# Patient Record
Sex: Female | Born: 1990 | Race: Black or African American | Hispanic: No | Smoking: Never smoker
Health system: Southern US, Community
[De-identification: ages and names within clinical notes are randomized; demographics above are authoritative.]

## PROBLEM LIST (undated history)

## (undated) ENCOUNTER — Inpatient Hospital Stay (HOSPITAL_COMMUNITY): Payer: Self-pay

## (undated) DIAGNOSIS — E11319 Type 2 diabetes mellitus with unspecified diabetic retinopathy without macular edema: Secondary | ICD-10-CM

## (undated) DIAGNOSIS — H269 Unspecified cataract: Secondary | ICD-10-CM

## (undated) DIAGNOSIS — N186 End stage renal disease: Secondary | ICD-10-CM

## (undated) DIAGNOSIS — Z992 Dependence on renal dialysis: Secondary | ICD-10-CM

## (undated) DIAGNOSIS — J45909 Unspecified asthma, uncomplicated: Secondary | ICD-10-CM

## (undated) DIAGNOSIS — I10 Essential (primary) hypertension: Secondary | ICD-10-CM

## (undated) DIAGNOSIS — E1121 Type 2 diabetes mellitus with diabetic nephropathy: Secondary | ICD-10-CM

## (undated) DIAGNOSIS — H35039 Hypertensive retinopathy, unspecified eye: Secondary | ICD-10-CM

## (undated) DIAGNOSIS — E876 Hypokalemia: Secondary | ICD-10-CM

## (undated) DIAGNOSIS — O10919 Unspecified pre-existing hypertension complicating pregnancy, unspecified trimester: Secondary | ICD-10-CM

## (undated) DIAGNOSIS — R112 Nausea with vomiting, unspecified: Secondary | ICD-10-CM

## (undated) DIAGNOSIS — O358XX0 Maternal care for other (suspected) fetal abnormality and damage, not applicable or unspecified: Secondary | ICD-10-CM

## (undated) DIAGNOSIS — E119 Type 2 diabetes mellitus without complications: Secondary | ICD-10-CM

## (undated) DIAGNOSIS — F329 Major depressive disorder, single episode, unspecified: Secondary | ICD-10-CM

## (undated) DIAGNOSIS — O99340 Other mental disorders complicating pregnancy, unspecified trimester: Secondary | ICD-10-CM

## (undated) DIAGNOSIS — R809 Proteinuria, unspecified: Secondary | ICD-10-CM

## (undated) DIAGNOSIS — E109 Type 1 diabetes mellitus without complications: Secondary | ICD-10-CM

## (undated) DIAGNOSIS — F32A Depression, unspecified: Secondary | ICD-10-CM

## (undated) DIAGNOSIS — O365990 Maternal care for other known or suspected poor fetal growth, unspecified trimester, not applicable or unspecified: Secondary | ICD-10-CM

## (undated) DIAGNOSIS — E1029 Type 1 diabetes mellitus with other diabetic kidney complication: Secondary | ICD-10-CM

## (undated) DIAGNOSIS — E860 Dehydration: Secondary | ICD-10-CM

## (undated) DIAGNOSIS — R911 Solitary pulmonary nodule: Secondary | ICD-10-CM

## (undated) DIAGNOSIS — O211 Hyperemesis gravidarum with metabolic disturbance: Secondary | ICD-10-CM

## (undated) DIAGNOSIS — F419 Anxiety disorder, unspecified: Secondary | ICD-10-CM

## (undated) HISTORY — DX: Solitary pulmonary nodule: R91.1

## (undated) HISTORY — PX: WISDOM TOOTH EXTRACTION: SHX21

## (undated) HISTORY — DX: Type 1 diabetes mellitus without complications: E10.9

## (undated) HISTORY — DX: Dehydration: E86.0

## (undated) HISTORY — DX: Maternal care for other known or suspected poor fetal growth, unspecified trimester, not applicable or unspecified: O36.5990

## (undated) HISTORY — PX: NO PAST SURGERIES: SHX2092

## (undated) HISTORY — DX: Hyperemesis gravidarum with metabolic disturbance: O21.1

## (undated) HISTORY — DX: Type 2 diabetes mellitus with unspecified diabetic retinopathy without macular edema: E11.319

## (undated) HISTORY — DX: Hypertensive retinopathy, unspecified eye: H35.039

## (undated) HISTORY — DX: Hypomagnesemia: E83.42

## (undated) HISTORY — DX: Other mental disorders complicating pregnancy, unspecified trimester: O99.340

## (undated) HISTORY — DX: Nausea with vomiting, unspecified: R11.2

## (undated) HISTORY — DX: Type 2 diabetes mellitus without complications: E11.9

## (undated) HISTORY — DX: Major depressive disorder, single episode, unspecified: F32.9

## (undated) HISTORY — DX: Proteinuria, unspecified: R80.9

## (undated) HISTORY — DX: Unspecified cataract: H26.9

## (undated) HISTORY — DX: Type 2 diabetes mellitus with diabetic nephropathy: E11.21

## (undated) HISTORY — DX: Unspecified pre-existing hypertension complicating pregnancy, unspecified trimester: O10.919

## (undated) HISTORY — DX: Morbid (severe) obesity due to excess calories: E66.01

## (undated) HISTORY — DX: Maternal care for other (suspected) fetal abnormality and damage, not applicable or unspecified: O35.8XX0

## (undated) HISTORY — DX: Type 1 diabetes mellitus with other diabetic kidney complication: E10.29

## (undated) HISTORY — DX: Unspecified asthma, uncomplicated: J45.909

## (undated) HISTORY — DX: Hypokalemia: E87.6

## (undated) NOTE — *Deleted (*Deleted)
[facility] Note  11/12/2019     CHIEF COMPLAINT Patient presents for No chief complaint on file.   HISTORY OF PRESENT ILLNESS: Barbara Donovan is a 42 y.o. female who presents to the clinic today for:   pt is   Referring physician: Fanny Bien, MD [address] [place],  [place] [address]  HISTORICAL INFORMATION:   Selected notes from the MEDICAL RECORD NUMBER Referred by Dr. [name] for concern of bilateral CRVO    CURRENT MEDICATIONS: No current outpatient medications on file. (Ophthalmic Drugs)   No current facility-administered medications for this visit. (Ophthalmic Drugs)   Current Outpatient Medications (Other)  Medication Sig  . Blood Pressure Monitoring (OMRON 3 SERIES BP MONITOR) DEVI 1 each by Does not apply route daily. Take daily blood pressure. Please report back to Dr. Radford Pax in 2 wks.  . carvedilol (COREG) 25 MG tablet Take 1 tablet (25 mg total) by mouth 2 (two) times daily.  Marland Kitchen diltiazem (CARDIZEM CD) 240 MG 24 hr capsule Take 1 capsule (240 mg total) by mouth daily.  . furosemide (LASIX) 20 MG tablet Take 2 tablets (40 mg total) by mouth daily as needed for edema. (Patient taking differently: Take 20 mg by mouth daily as needed for edema. )  . hydrALAZINE (APRESOLINE) 50 MG tablet Take 50 mg by mouth three times daily  . insulin aspart (NOVOLOG) 100 UNIT/ML injection FOR USE IN PUMP, TOTAL OF 80 UNITS PER DAY. (Patient taking differently: Inject 80 Units into the skin daily. FOR USE IN PUMP, TOTAL OF 80 UNITS PER DAY.)  . Insulin Pen Needle (PEN NEEDLES) 32G X 5 MM MISC 1 Device by Does not apply route 4 (four) times daily. (Patient not taking: Reported on 09/14/2019)  . irbesartan (AVAPRO) 300 MG tablet Take 300 mg by mouth daily.  . metoCLOPramide (REGLAN) 10 MG tablet Take 1 tablet (10 mg total) by mouth every 8 (eight) hours as needed for up to 3 days for nausea.  . ondansetron (ZOFRAN-ODT) 8 MG disintegrating  tablet Take 1 tablet (8 mg total) by mouth every 8 (eight) hours as needed for nausea or vomiting.  [name] VERIO test strip USE AS DIRECTED 5 TIMES DAILY  . potassium chloride (KLOR-CON) 10 MEQ tablet Take 2 tablets (20 mEq total) by mouth daily for 2 days.  . promethazine (PHENERGAN) 25 MG tablet Take 1 tablet (25 mg total) by mouth every 6 (six) hours as needed for nausea or vomiting.   No current facility-administered medications for this visit. (Other)      REVIEW OF SYSTEMS:    ALLERGIES No Known Allergies  PAST MEDICAL HISTORY Past Medical History:  Diagnosis Date  . Asthma    as a child, no problems as an adult, no inhaler  . Cataract    NS OU  . Chronic hypertension during pregnancy, antepartum 08/19/2017  . Dehydration 01/28/2018  . Depression during pregnancy, antepartum 07/07/2017   6/20: Short trial of zoloft previously, reports didn't help much but also didn't give it a chance Discussed r/b/a SSRIs in pregnancy, agrees to try Zoloft again, rx sent No SI/HI/red flags  . Diabetes ([place])    TYPE I  . Diabetic retinopathy ([place]) 06/09/2017   07/2017 with bilateral severe diabetic non-proliferative retinopathy with macular edema.  Marland Kitchen HTN (hypertension)   . Hypertensive retinopathy    OU  . Hypokalemia 01/22/2018  . Hypomagnesemia 01/28/2018  . Intractable nausea and vomiting  01/22/2018  . Intrauterine growth restriction (IUGR) affecting care of mother 12/22/2017  . Morbid obesity ([place])   . 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 Proteinuria due to type 1 diabetes mellitus ([place]) 11/02/2017   Baseline Pr: Cr 10.23  . Severe hyperemesis gravidarum 10/30/2017  . Type I diabetes mellitus ([place]) 07/07/2017   Current Diabetic Medications:  Insulin  [x]  Aspirin 81 mg daily after 12 weeks (? A2/B GDM)  Required Referrals for A1GDM or A2GDM: [x]  Diabetes Education and Testing Supplies [x]  Nutrition Cousult  For A2/B GDM or higher classes of DM [x]  Diabetes Education and Testing  Supplies [x]  Nutrition Counsult [x]  Fetal ECHO after 20 weeks  [x]  Eye exam for retina evaluation - severe retinopathy 7/19  Base  . Ventricular septal defect (VSD) of fetus in singleton pregnancy, antepartum 09/30/2017   May go to newborn nursery per Dr. Lenard Simmer [facility] prior to discharge   Past Surgical History:  Procedure Laterality Date  . [place] VITRECTOMY WITH 20 GAUGE MVR PORT FOR MACULAR HOLE Left 07/20/2018   Procedure: 25 GAUGE PARS PLANA VITRECTOMY LEFT EYE;  Surgeon: Bernarda Caffey, MD;  Location: [facility];  Service: Ophthalmology;  Laterality: Left;  . CESAREAN SECTION N/A 12/26/2017   Procedure: CESAREAN SECTION;  Surgeon: Osborne Oman, MD;  Location: [facility];  Service: Obstetrics;  Laterality: N/A;  . EYE EXAMINATION UNDER ANESTHESIA Right 07/20/2018   Procedure: Eye Exam Under Anesthesia RIGHT EYE;  Surgeon: Bernarda Caffey, MD;  Location: [facility];  Service: Ophthalmology;  Laterality: Right;  . EYE SURGERY Left 07/2018  . GAS INSERTION Left 07/19/2019   Procedure: INSERTION OF GAS;  Surgeon: Bernarda Caffey, MD;  Location: [facility];  Service: Ophthalmology;  Laterality: Left;  SF6  . INJECTION OF SILICONE OIL Left 99991111   Procedure: Injection Of Silicone Oil LEFT EYE;  Surgeon: Bernarda Caffey, MD;  Location: [facility];  Service: Ophthalmology;  Laterality: Left;  . LASER PHOTO ABLATION Right 07/20/2018   Procedure: Laser Photo Ablation RIGHT EYE;  Surgeon: Bernarda Caffey, MD;  Location: [facility];  Service: Ophthalmology;  Laterality: Right;  . MEMBRANE PEEL Left 07/20/2018   Procedure: Membrane Peel LEFT EYE;  Surgeon: Bernarda Caffey, MD;  Location: [facility];  Service: Ophthalmology;  Laterality: Left;  . MEMBRANE PEEL Left 07/19/2019   Procedure: MEMBRANE PEEL;  Surgeon: Bernarda Caffey, MD;  Location: [place] [place] [facility];  Service: Ophthalmology;  Laterality: Left;  . MITOMYCIN C APPLICATION Bilateral 99991111   Procedure: Avastin Application;  Surgeon: Bernarda Caffey, MD;  Location: [facility];  Service:  Ophthalmology;  Laterality: Bilateral;  . PHOTOCOAGULATION WITH LASER Left 07/20/2018   Procedure: Photocoagulation With Laser LEFT EYE;  Surgeon: Bernarda Caffey, MD;  Location: [facility];  Service: Ophthalmology;  Laterality: Left;  . PHOTOCOAGULATION WITH LASER Left 07/19/2019   Procedure: PHOTOCOAGULATION WITH LASER;  Surgeon: Bernarda Caffey, MD;  Location: [facility];  Service: Ophthalmology;  Laterality: Left;  . RETINAL DETACHMENT SURGERY Left 07/20/2018   Dr. Bernarda Caffey  . SILICON OIL REMOVAL Left 07/19/2019   Procedure: 25g PARS PLANA VITRECTOMY WITH SILICON OIL REMOVAL;  Surgeon: Bernarda Caffey, MD;  Location: [facility];  Service: Ophthalmology;  Laterality: Left;  . WISDOM TOOTH EXTRACTION      FAMILY HISTORY Family History  Problem Relation Age of Onset  . Diabetes Mother   . Aneurysm Mother 38  . Seizures Mother   . Diabetes Father   . Cataracts Father   . COPD Father   .  Heart attack Father   . Heart disease Father   . Healthy Sister   . Healthy Daughter   . Stroke Maternal Grandfather   . Amblyopia Neg Hx   . Blindness Neg Hx   . Glaucoma Neg Hx   . Macular degeneration Neg Hx   . Retinal detachment Neg Hx   . Strabismus Neg Hx   . Retinitis pigmentosa Neg Hx     SOCIAL HISTORY Social History   Tobacco Use  . Smoking status: Never Smoker  . Smokeless tobacco: Never Used  Vaping Use  . Vaping Use: Never used  Substance Use Topics  . Alcohol use: Not Currently    Comment: SOCIALLY  . Drug use: Never         OPHTHALMIC EXAM:  Not recorded     IMAGING AND PROCEDURES  Imaging and Procedures for @TODAY @           ASSESSMENT/PLAN:    ICD-10-CM   1. 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 Traction detachment of left retina  H33.42   3. Vitreous hemorrhage of both eyes ([place])  H43.13   4. Epiretinal membrane (ERM) of left eye  H35.372   5. Retinal edema  H35.81   6. Essential hypertension   I10   7. Hypertensive retinopathy of both eyes  H35.033   8. Pseudophakia  Z96.1   9. Combined forms of age-related cataract of right eye  H25.811   10. Combined forms of age-related cataract of both eyes  H25.813   11. Central retinal vein occlusion with macular edema of both eyes  H34.8130   12. Nuclear sclerosis of both eyes  H25.13   13. Type 1 diabetes mellitus with moderate nonproliferative retinopathy of both eyes and macular edema ([place])  E10.3313   14. Vitreous hemorrhage of left eye ([facility])  H43.12   15. [redacted] weeks gestation of pregnancy  Z3A.33   16. [redacted] weeks gestation of pregnancy  Z3A.15     1-3. 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 Eulas Post, 12.15.20 -- looks good, mild PCO  - s/p PPV/SOR removal/Tissue Blue stain/MP/20% SF6 gas, IVA OS, 07.01.21             - IOP 17 (OS) -- vastly improved from 40+ last week             - continue Brimonidine/Cosopt TID OS  - continue latanoprost QHS OS  - f/u 4 weeks, DFE, OCT  OD  - today, VH significantly improved (amazing response to IVA OD #3, 9.27.21)  - s/p fill in PRP and IVA OD 07.02.2020 in operating room  - diffuse VH OD ~11.26.20   - s/p IVA OD #2 (11.30.20), #3 (09.24.21)  - BCVA  improved to 20/25 from HM  - f/u 4 weeks, DFE, OCT  4,5. Epiretinal membrane, left eye -- improved  - focal ERM with retinal thickening nasal macula   - s/p TissueBlue assisted membrane peel  OS as above (7.1.21)  - OCT today shows persistent nasal thickening post-ERM peel   6,7. Hypertensive retinopathy OU  - there was concern for pregnancy related hypertension (I.e. Pre-eclampsia, Eclampsia, HELLP, etc) during pregnancy  - also during pregnancy (7.17.2019), had bilateral CRVO w/ CME (OD > OS) s/p PRP OS  - discussed importance of tight BP control  - monitor  8. Mixed cataract OD  - The symptoms of cataract, surgical options, and treatments and risks were discussed with patient.  - not visually significant    9. Pseudophakia OS  - s/p CE/IOL OS w/ Dr. Eulas Post, 12.15.2020  - beautiful surgery, doing well    Ophthalmic Meds Ordered this visit:  No orders of the defined types were placed in this encounter.    This document serves as a record of services personally performed by Gardiner Sleeper, MD, PhD. It was created on their behalf by Leeann Must, [place], an ophthalmic technician. The creation of this record is the provider's dictation and/or activities during the visit.    Electronically signed by: Leeann Must, COA @TODAY @ 3:50 PM   Abbreviations: M myopia (nearsighted); A astigmatism; H hyperopia (farsighted); P presbyopia; Mrx spectacle prescription;  CTL contact lenses; OD right eye; OS left eye; OU both eyes  XT exotropia; ET esotropia; PEK punctate epithelial keratitis; PEE punctate epithelial erosions; DES dry eye syndrome; MGD meibomian gland dysfunction; ATs artificial tears; PFAT's preservative free artificial tears; [place] nuclear sclerotic cataract; PSC posterior subcapsular cataract; ERM epi-retinal membrane; PVD posterior vitreous detachment; RD retinal detachment; DM diabetes mellitus; DR diabetic retinopathy; NPDR non-proliferative diabetic retinopathy; PDR proliferative diabetic retinopathy; CSME clinically significant macular edema; DME diabetic macular edema; dbh dot blot hemorrhages; CWS cotton wool spot; POAG primary open angle glaucoma; C/D cup-to-disc  ratio; HVF humphrey visual field; GVF goldmann visual field; OCT optical coherence tomography; IOP intraocular pressure; BRVO Branch retinal vein occlusion; CRVO central retinal vein occlusion; CRAO central retinal artery occlusion; BRAO branch retinal artery occlusion; RT retinal tear; SB scleral buckle; PPV pars plana vitrectomy; VH Vitreous hemorrhage; PRP panretinal laser photocoagulation; IVK intravitreal kenalog; VMT vitreomacular traction; MH Macular hole;  NVD neovascularization of the disc; NVE neovascularization elsewhere; AREDS age related eye disease study; ARMD age related macular degeneration; POAG primary open angle glaucoma; EBMD epithelial/anterior basement membrane dystrophy; ACIOL anterior chamber intraocular lens; IOL intraocular lens; PCIOL posterior chamber intraocular lens; Phaco/IOL phacoemulsification with intraocular lens placement; [place] photorefractive keratectomy; LASIK laser assisted in situ keratomileusis; HTN hypertension; DM diabetes mellitus; COPD chronic obstructive pulmonary disease

---
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
## 2011-01-05 ENCOUNTER — Emergency Department: Payer: Self-pay | Admitting: Internal Medicine

## 2013-10-30 DIAGNOSIS — J45909 Unspecified asthma, uncomplicated: Secondary | ICD-10-CM | POA: Insufficient documentation

## 2013-10-30 HISTORY — DX: Unspecified asthma, uncomplicated: J45.909

## 2014-11-02 DIAGNOSIS — F32A Depression, unspecified: Secondary | ICD-10-CM | POA: Insufficient documentation

## 2014-11-02 DIAGNOSIS — F329 Major depressive disorder, single episode, unspecified: Secondary | ICD-10-CM | POA: Insufficient documentation

## 2017-05-30 ENCOUNTER — Encounter: Payer: Self-pay | Admitting: Endocrinology

## 2017-06-09 ENCOUNTER — Ambulatory Visit: Payer: Self-pay | Admitting: Endocrinology

## 2017-06-09 ENCOUNTER — Other Ambulatory Visit: Payer: Self-pay | Admitting: Endocrinology

## 2017-06-09 ENCOUNTER — Encounter: Payer: Self-pay | Admitting: Endocrinology

## 2017-06-09 DIAGNOSIS — E109 Type 1 diabetes mellitus without complications: Secondary | ICD-10-CM

## 2017-06-09 DIAGNOSIS — E11319 Type 2 diabetes mellitus with unspecified diabetic retinopathy without macular edema: Secondary | ICD-10-CM | POA: Insufficient documentation

## 2017-06-09 HISTORY — DX: Type 2 diabetes mellitus with unspecified diabetic retinopathy without macular edema: E11.319

## 2017-06-09 LAB — TSH: TSH: 1.14 u[IU]/mL (ref 0.35–4.50)

## 2017-06-09 MED ORDER — GLUCOSE BLOOD VI STRP: ORAL_STRIP | 11 refills | Status: DC

## 2017-06-09 MED ORDER — INSULIN ASPART 100 UNIT/ML FLEXPEN: 7.0000 [IU] | PEN_INJECTOR | Freq: Three times a day (TID) | SUBCUTANEOUS | 11 refills | Status: DC

## 2017-06-09 MED ORDER — GLUCOSE BLOOD VI STRP: ORAL_STRIP | 12 refills | Status: DC

## 2017-06-09 MED ORDER — ONETOUCH ULTRASOFT LANCETS MISC: 12 refills | Status: DC

## 2017-06-09 MED ORDER — BASAGLAR KWIKPEN 100 UNIT/ML ~~LOC~~ SOPN
22.0000 [IU] | PEN_INJECTOR | Freq: Every day | SUBCUTANEOUS | 11 refills | Status: DC
Start: 2017-06-09 — End: 2017-06-24

## 2017-06-09 MED ORDER — ONETOUCH VERIO W/DEVICE KIT: 1.0000 | PACK | Freq: Every day | 0 refills | Status: DC

## 2017-06-09 NOTE — Progress Notes (Signed)
Subjective:    Patient ID: Barbara Donovan, female    DOB: [birth year], 27 y.o.   MRN: [id number]  HPI pt is referred by Dr Garwin Brothers, for diabetes.  Pt states DM was dx'ed in 2003; she has mild if any neuropathy of the lower extremities; she is unaware of any associated chronic complications; she has been on insulin since 2009; pt says her diet and exercise are poor; she has never had pancreatitis, pancreatic surgery, or severe hypoglycemia.  Last episode of DKA was 2015, after she stopped taking her insulin.  Since recent a1c, she was rx'ed basaglar, 22 units qd, and novolog 7 units 3 times a day (just before each meal).  However, she did not start taking.  She is now [redacted] weeks pregnant.   Past Medical History:  Diagnosis Date  . Diabetes [facility])      Social History   Socioeconomic History  . Marital status: Single    Spouse name: Not on file  . Number of children: Not on file  . Years of education: Not on file  . Highest education level: Not on file  Occupational History  . Not on file  Social Needs  . Financial resource strain: Not on file  . Food insecurity:    Worry: Not on file    Inability: Not on file  . Transportation needs:    Medical: Not on file    Non-medical: Not on file  Tobacco Use  . Smoking status: Never Smoker  . Smokeless tobacco: Never Used  Substance and Sexual Activity  . Alcohol use: Not Currently  . Drug use: Never  . Sexual activity: Yes  Lifestyle  . Physical activity:    Days per week: Not on file    Minutes per session: Not on file  . Stress: Not on file  Relationships  . Social connections:    Talks on phone: Not on file    Gets together: Not on file    Attends religious service: Not on file    Active member of club or organization: Not on file    Attends meetings of clubs or organizations: Not on file    Relationship status: Not on file  . Intimate partner violence:    Fear of current or ex partner: Not on file    Emotionally abused: Not  on file    Physically abused: Not on file    Forced sexual activity: Not on file  Other Topics Concern  . Not on file  Social History Narrative  . Not on file    No current outpatient medications on file prior to visit.   No current facility-administered medications on file prior to visit.     No Known Allergies  Family History  Problem Relation Age of Onset  . Diabetes Mother   . Diabetes Father     BP 132/82   Pulse 92   Wt 158 lb (71.7 kg)   LMP 04/02/2017 (Approximate)   SpO2 94%     Review of Systems denies weight loss, blurry vision, headache, chest pain, sob, n/v, urinary frequency, muscle cramps, excessive diaphoresis, cold intolerance, rhinorrhea, and easy bruising.  Depression is well-controlled.      Objective:   Physical Exam VS: see vs page GEN: no distress HEAD: head: no deformity eyes: no periorbital swelling, no proptosis external nose and ears are normal mouth: no lesion seen NECK: supple, thyroid is slightly and diffusely enlarged.  CHEST WALL: no deformity LUNGS: clear to auscultation CV:  reg rate and rhythm, no murmur ABD: abdomen is soft, nontender.  no hepatosplenomegaly.  not distended.  no hernia MUSCULOSKELETAL: muscle bulk and strength are grossly normal.  no obvious joint swelling.  gait is normal and steady EXTEMITIES: no deformity.  no ulcer on the feet.  feet are of normal color and temp.  no edema PULSES: dorsalis pedis intact bilat.  no carotid bruit NEURO:  cn 2-12 grossly intact.   readily moves all 4's.  sensation is intact to touch on the feet SKIN:  Normal texture and temperature.  No rash or suspicious lesion is visible.   NODES:  None palpable at the neck PSYCH: alert, well-oriented.  Does not appear anxious nor depressed.    outside test results are reviewed: A1c=14.7%  I have reviewed outside records, and summarized: Pt was noted to have severely elevated a1c, and referred here.  She was [facility], and  was noted to have chronically very poor glycemic control.        Assessment & Plan:  Type 1 DM: severe exacerbation Noncompliance with insulin, new to me: this is high-risk OB case.    Patient Instructions  good diet and exercise significantly improve the control of your diabetes.  please let me know if you wish to be referred to a dietician.  high blood sugar is very risky to your health.  you should see an eye doctor and dentist every year.  It is very important to get all recommended vaccinations.  Controlling your blood pressure and cholesterol drastically reduces the damage diabetes does to your body.  Those who smoke should quit.  Please discuss these with your doctor.  check your blood sugar 5 times a day, before the 3 meals, at bedtime, and after meals on a rotating basis.  also check if you have symptoms of your blood sugar being too high or too low.  please keep a record of the readings and bring it to your next appointment here (or you can bring the meter itself).  You can write it on any piece of paper.  please call us sooner if your blood sugar goes below 70, or if you have a lot of readings over 200.   Please come back for a follow-up appointment next week.  Please also see Vaughan Basta then.

## 2017-06-09 NOTE — Patient Instructions (Signed)
good diet and exercise significantly improve the control of your diabetes.  please let me know if you wish to be referred to a dietician.  high blood sugar is very risky to your health.  you should see an eye doctor and dentist every year.  It is very important to get all recommended vaccinations.  Controlling your blood pressure and cholesterol drastically reduces the damage diabetes does to your body.  Those who smoke should quit.  Please discuss these with your doctor.  check your blood sugar 5 times a day, before the 3 meals, at bedtime, and after meals on a rotating basis.  also check if you have symptoms of your blood sugar being too high or too low.  please keep a record of the readings and bring it to your next appointment here (or you can bring the meter itself).  You can write it on any piece of paper.  please call us sooner if your blood sugar goes below 70, or if you have a lot of readings over 200.   Please come back for a follow-up appointment next week.  Please also see Barbara Donovan then.

## 2017-06-12 ENCOUNTER — Encounter: Payer: Self-pay | Admitting: Endocrinology

## 2017-06-14 ENCOUNTER — Encounter: Payer: Self-pay | Admitting: Endocrinology

## 2017-06-14 ENCOUNTER — Ambulatory Visit: Payer: BC Managed Care – PPO | Admitting: Endocrinology

## 2017-06-14 ENCOUNTER — Encounter: Payer: BC Managed Care – PPO | Attending: Endocrinology | Admitting: Nutrition

## 2017-06-14 VITALS — BP 152/78 | HR 98 | Wt 168.0 lb

## 2017-06-14 DIAGNOSIS — E108 Type 1 diabetes mellitus with unspecified complications: Secondary | ICD-10-CM

## 2017-06-14 DIAGNOSIS — E109 Type 1 diabetes mellitus without complications: Secondary | ICD-10-CM

## 2017-06-14 NOTE — Progress Notes (Signed)
Patient is here with her hanband. we Discussed how pumps work, and she was shown the different pumps.  We discussed the advantages and disadvantages of pump therapy, as well as of each different pump.  She was given brochures of each model and of the dexcom sensor.  She will review them, and go on the websites to determine which pump she wants.  She will then fill out the appropriate paperwork and return it to me, to be faxed in. We discussed the goals for blood sugar control and the need to adjust the meal time insulin dose for the meal size and she reported good understanding of this. We also discussed the importance of testing blood sugars before meals and at 2 hours after meals, to determine if she is taking the correct insulin dose.She reported good understanding of this and had no final questions.

## 2017-06-14 NOTE — Patient Instructions (Addendum)
Please continue the same insulins. Please see Barbara Donovan today. check your blood sugar 5 times a day, before the 3 meals, at bedtime, and after meals on a rotating basis.  also check if you have symptoms of your blood sugar being too high or too low.  please keep a record of the readings and bring it to your next appointment here (or you can bring the meter itself).  You can write it on any piece of paper.  please call us sooner if your blood sugar goes below 70, or if you have a lot of readings over 200.  Please come back for a follow-up appointment in 7-10 days.

## 2017-06-14 NOTE — Progress Notes (Signed)
Subjective:    Patient ID: Barbara Donovan, female    DOB: 04/09/1990, 27 y.o.   MRN: [id number]  HPI Pt returns for f/u of diabetes mellitus: DM type: 1 Dx'ed: 2637 Complications: none Therapy: insulin since 2009 DKA: last episode was 2015.  Severe hypoglycemia: never Pancreatitis: never Pancreatic imaging: never  Other: she takes multiple daily injections Interval history:  She is now [redacted] weeks pregnant. meter is downloaded today, and the printout is scanned into the record.  cbg varies from 90-270.  There is no trend throughout the day.  However, she says the high cbg's are before she started the insulin.   Past Medical History:  Diagnosis Date  . Diabetes [facility])      Social History   Socioeconomic History  . Marital status: Single    Spouse name: Not on file  . Number of children: Not on file  . Years of education: Not on file  . Highest education level: Not on file  Occupational History  . Not on file  Social Needs  . Financial resource strain: Not on file  . Food insecurity:    Worry: Not on file    Inability: Not on file  . Transportation needs:    Medical: Not on file    Non-medical: Not on file  Tobacco Use  . Smoking status: Never Smoker  . Smokeless tobacco: Never Used  Substance and Sexual Activity  . Alcohol use: Not Currently  . Drug use: Never  . Sexual activity: Yes  Lifestyle  . Physical activity:    Days per week: Not on file    Minutes per session: Not on file  . Stress: Not on file  Relationships  . Social connections:    Talks on phone: Not on file    Gets together: Not on file    Attends religious service: Not on file    Active member of club or organization: Not on file    Attends meetings of clubs or organizations: Not on file    Relationship status: Not on file  . Intimate partner violence:    Fear of current or ex partner: Not on file    Emotionally abused: Not on file    Physically abused: Not on file    Forced sexual activity:  Not on file  Other Topics Concern  . Not on file  Social History Narrative  . Not on file    Current Outpatient Medications on File Prior to Visit  Medication Sig Dispense Refill  . Blood Glucose Monitoring Suppl (ONETOUCH VERIO) w/Device KIT 1 Device by Does not apply route daily. 1 kit 0  . glucose blood (ONETOUCH VERIO) test strip Used to check blood sugars five times daily. 150 each 12  . insulin aspart (NOVOLOG FLEXPEN) 100 UNIT/ML FlexPen Inject 7 Units into the skin 3 (three) times daily with meals. And pen needles 4/day 15 mL 11  . Insulin Glargine (BASAGLAR KWIKPEN) 100 UNIT/ML SOPN Inject 0.22 mLs (22 Units total) into the skin at bedtime. 5 pen 11  . Lancets (ONETOUCH ULTRASOFT) lancets Used to check blood sugars five times daily. 200 each 12   No current facility-administered medications on file prior to visit.     No Known Allergies  Family History  Problem Relation Age of Onset  . Diabetes Mother   . Diabetes Father     BP (!) 152/78   Pulse 98   Wt 168 lb (76.2 kg)   LMP 04/02/2017 (Approximate)  SpO2 99%    Review of Systems She denies hypoglycemia.      Objective:   Physical Exam VITAL SIGNS:  See vs page.   GENERAL: no distress.   VITAL SIGNS:  See vs page GENERAL: no distress Pulses: foot pulses are intact bilaterally.   MSK: no deformity of the feet or ankles.  CV: trace bilat edema of the legs.   Skin:  no ulcer on the feet or ankles.  normal color and temp on the feet and ankles Neuro: sensation is intact to touch on the feet and ankles.         Assessment & Plan:  Type 1 DM: apparently well-controlled Pregnancy: in this setting, she needs aggressive glycemic control  Patient Instructions  Please continue the same insulins. Please see Vaughan Basta today. check your blood sugar 5 times a day, before the 3 meals, at bedtime, and after meals on a rotating basis.  also check if you have symptoms of your blood sugar being too high or too low.   please keep a record of the readings and bring it to your next appointment here (or you can bring the meter itself).  You can write it on any piece of paper.  please call us sooner if your blood sugar goes below 70, or if you have a lot of readings over 200.  Please come back for a follow-up appointment in 7-10 days.

## 2017-06-14 NOTE — Patient Instructions (Signed)
Test blood sugars before meals and at bedtime. Increase meal time insulin by 1-2 units if eating more food Call if questions.  Call when pump comes in to set up appointment for training.

## 2017-06-22 ENCOUNTER — Encounter (HOSPITAL_COMMUNITY): Payer: Self-pay | Admitting: *Deleted

## 2017-06-24 ENCOUNTER — Ambulatory Visit: Payer: BC Managed Care – PPO | Admitting: Endocrinology

## 2017-06-24 ENCOUNTER — Encounter: Payer: Self-pay | Admitting: Endocrinology

## 2017-06-24 ENCOUNTER — Ambulatory Visit (HOSPITAL_COMMUNITY)
Admission: RE | Admit: 2017-06-24 | Discharge: 2017-06-24 | Disposition: A | Discharge status: Home / Self Care | Payer: BC Managed Care – PPO | Source: Ambulatory Visit | Attending: Obstetrics and Gynecology | Admitting: Obstetrics and Gynecology

## 2017-06-24 ENCOUNTER — Encounter (HOSPITAL_COMMUNITY): Payer: Self-pay

## 2017-06-24 ENCOUNTER — Telehealth: Payer: Self-pay | Admitting: Nutrition

## 2017-06-24 VITALS — BP 158/74 | HR 101 | Wt 175.0 lb

## 2017-06-24 DIAGNOSIS — E109 Type 1 diabetes mellitus without complications: Secondary | ICD-10-CM | POA: Diagnosis not present

## 2017-06-24 DIAGNOSIS — R030 Elevated blood-pressure reading, without diagnosis of hypertension: Secondary | ICD-10-CM | POA: Insufficient documentation

## 2017-06-24 DIAGNOSIS — Z3A09 9 weeks gestation of pregnancy: Secondary | ICD-10-CM | POA: Diagnosis not present

## 2017-06-24 DIAGNOSIS — O26891 Other specified pregnancy related conditions, first trimester: Secondary | ICD-10-CM | POA: Insufficient documentation

## 2017-06-24 DIAGNOSIS — Z794 Long term (current) use of insulin: Secondary | ICD-10-CM | POA: Insufficient documentation

## 2017-06-24 DIAGNOSIS — O24011 Pre-existing diabetes mellitus, type 1, in pregnancy, first trimester: Secondary | ICD-10-CM | POA: Diagnosis present

## 2017-06-24 LAB — POCT GLYCOSYLATED HEMOGLOBIN (HGB A1C): HEMOGLOBIN A1C: 11.9 % — AB (ref 4.0–5.6)

## 2017-06-24 MED ORDER — BASAGLAR KWIKPEN 100 UNIT/ML ~~LOC~~ SOPN: 28.0000 [IU] | PEN_INJECTOR | Freq: Every day | SUBCUTANEOUS | 11 refills | Status: DC

## 2017-06-24 NOTE — Patient Instructions (Addendum)
Please continue the same novolog, and: Increase the basaglar to 28 units at bedtime.  check your blood sugar 5 times a day, before the 3 meals, at bedtime, and after meals on a rotating basis.  also check if you have symptoms of your blood sugar being too high or too low.  please keep a record of the readings and bring it to your next appointment here (or you can bring the meter itself).  You can write it on any piece of paper.  please call us sooner if your blood sugar goes below 70, or if you have a lot of readings over 200.  Please come back for a follow-up appointment in 2-3 weeks.

## 2017-06-24 NOTE — Telephone Encounter (Signed)
Please call patient at ph# [phone] needs CPT code for her pump

## 2017-06-24 NOTE — Progress Notes (Signed)
Subjective:    Patient ID: Barbara Donovan, female    DOB: January 28, 1990, 27 y.o.   MRN: [id number]  HPI Pt returns for f/u of diabetes mellitus: DM type: 1 Dx'ed: 9735 Complications: none Therapy: insulin since 2009 DKA: last episode was 2015.  Severe hypoglycemia: never Pancreatitis: never Pancreatic imaging: never.  Other: she takes multiple daily injections. Interval history:  no cbg record, but states cbg's vary from 55-200.  It is in general highest in the afternoon, but the 55 was also then.  This happened after a smaller than expected meal.  She says cbg is higher fasting than at HS.  pt states he feels well in general.  She is working on getting a pump.  Pt says she never misses the insulin.   Past Medical History:  Diagnosis Date  . Diabetes [facility])     Past Surgical History:  Procedure Laterality Date  . WISDOM TOOTH EXTRACTION      Social History   Socioeconomic History  . Marital status: Single    Spouse name: Not on file  . Number of children: Not on file  . Years of education: Not on file  . Highest education level: Not on file  Occupational History  . Not on file  Social Needs  . Financial resource strain: Not on file  . Food insecurity:    Worry: Not on file    Inability: Not on file  . Transportation needs:    Medical: Not on file    Non-medical: Not on file  Tobacco Use  . Smoking status: Never Smoker  . Smokeless tobacco: Never Used  Substance and Sexual Activity  . Alcohol use: Not Currently  . Drug use: Never  . Sexual activity: Yes  Lifestyle  . Physical activity:    Days per week: Not on file    Minutes per session: Not on file  . Stress: Not on file  Relationships  . Social connections:    Talks on phone: Not on file    Gets together: Not on file    Attends religious service: Not on file    Active member of club or organization: Not on file    Attends meetings of clubs or organizations: Not on file    Relationship status: Not on file   . Intimate partner violence:    Fear of current or ex partner: Not on file    Emotionally abused: Not on file    Physically abused: Not on file    Forced sexual activity: Not on file  Other Topics Concern  . Not on file  Social History Narrative  . Not on file    Current Outpatient Medications on File Prior to Visit  Medication Sig Dispense Refill  . aspirin 81 MG chewable tablet Chew by mouth daily.    Marland Kitchen glucose blood (ONETOUCH VERIO) test strip Used to check blood sugars five times daily. 150 each 12  . insulin aspart (NOVOLOG FLEXPEN) 100 UNIT/ML FlexPen Inject 7 Units into the skin 3 (three) times daily with meals. And pen needles 4/day 15 mL 11  . Lancets (ONETOUCH ULTRASOFT) lancets Used to check blood sugars five times daily. 200 each 12   No current facility-administered medications on file prior to visit.     No Known Allergies  Family History  Problem Relation Age of Onset  . Diabetes Mother   . Diabetes Father     BP (!) 158/74   Pulse (!) 101   Wt 175 lb (  79.4 kg)   LMP 04/02/2017 (Approximate)   SpO2 99%   BMI 27.41 kg/m   Review of Systems Denies LOC.     Objective:   Physical Exam VITAL SIGNS:  See vs page GENERAL: no distress Pulses: dorsalis pedis intact bilat.   MSK: no deformity of the feet CV: no leg edema Skin:  no ulcer on the feet.  normal color and temp on the feet. Neuro: sensation is intact to touch on the feet       Assessment & Plan:  Type 1 DM: apparently improved back on insulin Noncompliance with cbg recording: this complicates the rx of DM Pregnancy: she needs aggresive glycemic control  Patient Instructions  Please continue the same novolog, and: Increase the basaglar to 28 units at bedtime.  check your blood sugar 5 times a day, before the 3 meals, at bedtime, and after meals on a rotating basis.  also check if you have symptoms of your blood sugar being too high or too low.  please keep a record of the readings and  bring it to your next appointment here (or you can bring the meter itself).  You can write it on any piece of paper.  please call us sooner if your blood sugar goes below 70, or if you have a lot of readings over 200.  Please come back for a follow-up appointment in 2-3 weeks.

## 2017-06-24 NOTE — Consult Note (Signed)
I had the pleasure of seeing your patient Barbara Donovan in the [facility] on 06/24/2017. As you know, Barbara Donovan is a 27 y.o. G1P0 at [redacted]w[redacted]d who presents for consultation regarding her history of Type 1 pregestational diabetes and likely chronic hypertension.  Barbara Donovan was diagnosed with diabetes in 2003. She has been managed on insulin since that time, but was not taking any insulin for the last year-and-a-half. She recently restarted insulin with the diagnosis of pregnancy. She is currently followed by Dr. Loanne Drilling of endocrinology and is on a regimen of Lantus 22 units HS and Novolog 7 units with meals. She does not currently carb count but is trying to learn. They have discussed starting an insulin pump and continuous glucose monitor, but she is having difficulty with her insurance approving this. Barbara Donovan's most recent HbgA1c was 14.7 in May of this year. TSH was normal at 1.14. Barbara Donovan reports being seen for an eye exam at [facility] in March. She reports an episode of hospitalization for DKA in 2015 but no recent hospitalizations.   Barbara Donovan also reports she has been told she may have elevated blood pressure, although she was not previously aware of this. Her blood pressure today is elevated at 151/91.  The remainder of [place] medical history includes childhood asthma. Her past surgical history is significant for wisdom teeth extraction. She takes prenatal vitamins and insulin and is allergic to no medications. Barbara Donovan denies alcohol, tobacco or other drug use.  Her family history is significant for diabetes in both parents. She works as a Lawyer. She denies pregnancy related complaints.  During our visit today, we discussed the following issues:  1. T1DM diabetes: The significance and management of pregestational diabetes in pregnancy were reviewed.  We discussed maternal risks including preeclampsia, cesarean delivery,  diabetic ketoacidosis, and progression of nephropathy or retinopathy. We also reviewed neonatal risks, including abnormalities of fetal growth (intrauterine growth restriction or macrosomia), birth defects, hypoglycemia and hyperbilirubinemia, as well as the risk for stillbirth with suboptimal control. In addition, children of women with pregestational diabetes may have higher risks for diabetes and obesity later in life.   Barbara Donovan was previously instructed on dietary modification and blood glucose monitoring. We recommend glucometer testing a minimum of 7 times daily (fasting, before meals, 1 hour postprandials, and before bed) in women with type 1 diabetes or in any woman with suboptimal control. We discussed hypo- and hyperglycemia precautions.   RECOMMENDATIONS:   -- Glycemic control with target glucose ranges of fasting blood sugars less than 95 mg/dL, preprandial values <100 mg/dL, and 2 hour post-prandial values <120 mg/dL.  -- Therapy: Barbara Donovan is being closely followed by endocrinology and has an appointment later today. We also reviewed the increasing insulin requirements that occur during pregnancy secondary to progressive insulin resistance.  -- Laboratory testing: Barbara Donovan has had a recent hemoglobin A1c with a value of 14.7. We discussed the risk for birth defects of approximately 25% based on the results of her first trimester hemoglobin A1C, as well as our target hemoglobin A1C value less than 6% during pregnancy. I recommend repeat hemoglobin A1C measurement each trimester.  Barbara Donovan had a creatinine of 0.71 on 05/30/17. I also recommend a baseline urine protein assessment with either a protein/creatinine ratio or a 24-hour urine collection. In addition, women with pregestational diabetes should undergo a yearly ophthalmic exam, and women with evidence of retinopathy may require more frequent follow-up during pregnancy. While Barbara Donovan reports  an eye exam in March, given that this was done at  [facility] I believe it was unlikely she was seen by an ophthalmologist. Given her longstanding history of poor control, I recommend she be referred to ophthalmology for complete evaluation.   -- Fetal ultrasound assessment should include anatomy ultrasound and echocardiogram at 18-20 weeks. We also recommend monthly fetal growth ultrasounds beginning at 26-28 weeks.  -- Antepartum testing should include daily fetal kick counts starting at 28 weeks and antepartum testing starting at 32 weeks or sooner if indications arise.   -- Timing of delivery may be planned for 39. If control has been suboptimal, as indicated by any number of parameters, delivery between 37-39 weeks may be considered. Route of delivery and timing may also depend on ultrasound estimation of fetal weight and obstetrical history.   -- Labor management: Glucose control in labor may be achieved by capillary glucose assessment every 1-2 hours with a target of 70-110 mg/dL. An insulin infusion should be started if values exceed this range. If the patient is not taking p.o. nutrition during labor, maintenance fluids should include 5% dextrose to prevent ketosis.   -- Postpartum:  Barbara Donovan will require a decrease in her insulin dose in the postpartum period with close monitoring and frequent adjustments, particularly if she is breastfeeding. I recommend breastfeeding postpartum and efforts at reaching a healthy weight.   2. Hypertension: Barbara Donovan's blood pressure today was elevated at 151/91. Review of her prenatal notes show values of 120/78 and 144/86. We discussed she likely has chronic hypertension. Implications and risks of hypertension in pregnancy discussed. We reviewed the effects of chronic hypertension on pregnancy including the risk of worsening blood pressures requiring titration of antihypertensive therapy, increased risk of preeclampsia, fetal growth restriction, and need for preterm delivery with associated complications of  prematurity as well as an increased rate of abruption and stillbirth in women with chronic hypertension. Recommended baseline testing and antenatal surveillance as above. I also recommend Barbara Donovan begin low-dose aspirin daily to decrease her risk of preeclampsia.   At the conclusion of our visit, Barbara Donovan seemed to have a good understanding of the issues addressed above. Given her high risk pregnancy, we discussed the options of co-managed care versus transfer of care to Maternal-Fetal Medicine. I reviewed with Barbara Donovan that our practice will no longer be seeing patients for co-managed care at our current location beginning in July. However, we are always available at our office in [place]. Barbara Donovan expressed concerns about paying to see two different practices. I recommended she discuss this further with you.   Thank you for allowing [name] to participate in [place] pregnancy care.If you have any questions or concerns regarding my assessment or the recommendations outlined above, please feel free to contact me.  I spent 40 minutes face-to-face with the patient and/or family. More than 50% of the total encounter time was spent on counseling and coordination of care.   Abram Sander, MD Maternal-Fetal Medicine

## 2017-06-28 NOTE — Telephone Encounter (Signed)
Pt. Reported that she wants the [employer] pump and CGM, and wants to know what her insurance will pay for this.  She gave me permission to sign her name, and paperwork was filled out and faced to [employer] and [employer] for insurance verification and she was told that they will contact her in a few days with what her cost will be.

## 2017-07-07 ENCOUNTER — Encounter (HOSPITAL_COMMUNITY): Payer: Self-pay

## 2017-07-07 DIAGNOSIS — E1022 Type 1 diabetes mellitus with diabetic chronic kidney disease: Secondary | ICD-10-CM | POA: Insufficient documentation

## 2017-07-07 DIAGNOSIS — E109 Type 1 diabetes mellitus without complications: Secondary | ICD-10-CM | POA: Insufficient documentation

## 2017-07-07 DIAGNOSIS — F32A Depression, unspecified: Secondary | ICD-10-CM

## 2017-07-07 DIAGNOSIS — F329 Major depressive disorder, single episode, unspecified: Secondary | ICD-10-CM | POA: Insufficient documentation

## 2017-07-07 DIAGNOSIS — O10919 Unspecified pre-existing hypertension complicating pregnancy, unspecified trimester: Secondary | ICD-10-CM | POA: Insufficient documentation

## 2017-07-07 DIAGNOSIS — O99340 Other mental disorders complicating pregnancy, unspecified trimester: Secondary | ICD-10-CM

## 2017-07-07 DIAGNOSIS — O0990 Supervision of high risk pregnancy, unspecified, unspecified trimester: Secondary | ICD-10-CM | POA: Insufficient documentation

## 2017-07-07 HISTORY — DX: Depression, unspecified: F32.A

## 2017-07-07 HISTORY — DX: Type 1 diabetes mellitus without complications: E10.9

## 2017-07-07 LAB — OB RESULTS CONSOLE RUBELLA ANTIBODY, IGM: RUBELLA: IMMUNE

## 2017-07-07 LAB — OB RESULTS CONSOLE ANTIBODY SCREEN: Antibody Screen: NEGATIVE

## 2017-07-07 LAB — OB RESULTS CONSOLE HGB/HCT, BLOOD
HCT: 39 (ref 29–41)
Hemoglobin: 13

## 2017-07-07 LAB — OB RESULTS CONSOLE HIV ANTIBODY (ROUTINE TESTING): HIV: NONREACTIVE

## 2017-07-07 LAB — SICKLE CELL SCREEN: SICKLE CELL SCREEN: NEGATIVE

## 2017-07-07 LAB — OB RESULTS CONSOLE GC/CHLAMYDIA
Chlamydia: NEGATIVE
Gonorrhea: NEGATIVE

## 2017-07-07 LAB — OB RESULTS CONSOLE HEPATITIS B SURFACE ANTIGEN: HEP B S AG: NEGATIVE

## 2017-07-07 LAB — OB RESULTS CONSOLE PLATELET COUNT: Platelets: 374

## 2017-07-07 LAB — OB RESULTS CONSOLE ABO/RH: RH TYPE: POSITIVE

## 2017-07-07 LAB — OB RESULTS CONSOLE RPR: RPR: NONREACTIVE

## 2017-07-11 ENCOUNTER — Telehealth: Payer: Self-pay | Admitting: Endocrinology

## 2017-07-11 NOTE — Telephone Encounter (Signed)
[employer] is calling to check status of paper work that was faxed over to our office for the patients pump.  They stated this is Medical necessity form.  Phone- [phone]

## 2017-07-13 NOTE — Telephone Encounter (Signed)
We have sent in all requited paperwork & received fax transmission that faxes have gone through. We have received from [facility] & [employer]. [employer] rep also came by & we have given him labs as well as chart notes needed.

## 2017-07-17 ENCOUNTER — Emergency Department (HOSPITAL_COMMUNITY)
Admission: EM | Admit: 2017-07-17 | Discharge: 2017-07-17 | Disposition: A | Discharge status: Home / Self Care | Payer: BC Managed Care – PPO | Attending: Emergency Medicine | Admitting: Emergency Medicine

## 2017-07-17 ENCOUNTER — Other Ambulatory Visit: Payer: Self-pay

## 2017-07-17 ENCOUNTER — Encounter (HOSPITAL_COMMUNITY): Payer: Self-pay

## 2017-07-17 DIAGNOSIS — Z7982 Long term (current) use of aspirin: Secondary | ICD-10-CM | POA: Insufficient documentation

## 2017-07-17 DIAGNOSIS — R6883 Chills (without fever): Secondary | ICD-10-CM | POA: Insufficient documentation

## 2017-07-17 DIAGNOSIS — O219 Vomiting of pregnancy, unspecified: Secondary | ICD-10-CM

## 2017-07-17 DIAGNOSIS — E1065 Type 1 diabetes mellitus with hyperglycemia: Secondary | ICD-10-CM | POA: Insufficient documentation

## 2017-07-17 DIAGNOSIS — Z794 Long term (current) use of insulin: Secondary | ICD-10-CM | POA: Diagnosis not present

## 2017-07-17 DIAGNOSIS — O210 Mild hyperemesis gravidarum: Secondary | ICD-10-CM | POA: Insufficient documentation

## 2017-07-17 DIAGNOSIS — O26892 Other specified pregnancy related conditions, second trimester: Secondary | ICD-10-CM | POA: Insufficient documentation

## 2017-07-17 DIAGNOSIS — Z3A13 13 weeks gestation of pregnancy: Secondary | ICD-10-CM | POA: Insufficient documentation

## 2017-07-17 DIAGNOSIS — O10012 Pre-existing essential hypertension complicating pregnancy, second trimester: Secondary | ICD-10-CM | POA: Diagnosis not present

## 2017-07-17 LAB — I-STAT CHEM 8, ED
BUN: 8 mg/dL (ref 6–20)
Calcium, Ion: 1.13 mmol/L — ABNORMAL LOW (ref 1.15–1.40)
Chloride: 100 mmol/L (ref 98–111)
Creatinine, Ser: 0.5 mg/dL (ref 0.44–1.00)
Glucose, Bld: 166 mg/dL — ABNORMAL HIGH (ref 70–99)
HEMATOCRIT: 40 % (ref 36.0–46.0)
HEMOGLOBIN: 13.6 g/dL (ref 12.0–15.0)
POTASSIUM: 3.8 mmol/L (ref 3.5–5.1)
SODIUM: 134 mmol/L — AB (ref 135–145)
TCO2: 25 mmol/L (ref 22–32)

## 2017-07-17 LAB — URINALYSIS, ROUTINE W REFLEX MICROSCOPIC
BILIRUBIN URINE: NEGATIVE
KETONES UR: 80 mg/dL — AB
LEUKOCYTES UA: NEGATIVE
NITRITE: NEGATIVE
Protein, ur: >=300 mg/dL — AB
SPECIFIC GRAVITY, URINE: 1.018 (ref 1.005–1.030)
pH: 7 (ref 5.0–8.0)

## 2017-07-17 LAB — BLOOD GAS, VENOUS
Acid-Base Excess: 0.1 mmol/L (ref 0.0–2.0)
BICARBONATE: 24.6 mmol/L (ref 20.0–28.0)
O2 Saturation: 33.1 %
PATIENT TEMPERATURE: 98.6
PH VEN: 7.388 (ref 7.250–7.430)
pCO2, Ven: 41.8 mmHg — ABNORMAL LOW (ref 44.0–60.0)

## 2017-07-17 LAB — COMPREHENSIVE METABOLIC PANEL
ALBUMIN: 2.8 g/dL — AB (ref 3.5–5.0)
ALK PHOS: 36 U/L — AB (ref 38–126)
ALT: 13 U/L (ref 0–44)
ANION GAP: 9 (ref 5–15)
AST: 21 U/L (ref 15–41)
BILIRUBIN TOTAL: 1 mg/dL (ref 0.3–1.2)
BUN: 10 mg/dL (ref 6–20)
CO2: 25 mmol/L (ref 22–32)
Calcium: 8.8 mg/dL — ABNORMAL LOW (ref 8.9–10.3)
Chloride: 102 mmol/L (ref 98–111)
Creatinine, Ser: 0.64 mg/dL (ref 0.44–1.00)
GFR calc non Af Amer: >60 mL/min (ref >60)
GLUCOSE: 168 mg/dL — AB (ref 70–99)
POTASSIUM: 4.1 mmol/L (ref 3.5–5.1)
SODIUM: 136 mmol/L (ref 135–145)
Total Protein: 6.6 g/dL (ref 6.5–8.1)

## 2017-07-17 LAB — LIPASE, BLOOD: Lipase: 24 U/L (ref 11–51)

## 2017-07-17 LAB — CBC WITH DIFFERENTIAL/PLATELET
BASOS ABS: 0 10*3/uL (ref 0.0–0.1)
BASOS PCT: 0 %
EOS ABS: 0 10*3/uL (ref 0.0–0.7)
Eosinophils Relative: 0 %
HEMATOCRIT: 39.3 % (ref 36.0–46.0)
Hemoglobin: 13.4 g/dL (ref 12.0–15.0)
Lymphocytes Relative: 19 %
Lymphs Abs: 1.4 10*3/uL (ref 0.7–4.0)
MCH: 29 pg (ref 26.0–34.0)
MCHC: 34.1 g/dL (ref 30.0–36.0)
MCV: 85.1 fL (ref 78.0–100.0)
MONO ABS: 0.5 10*3/uL (ref 0.1–1.0)
Monocytes Relative: 7 %
NEUTROS ABS: 5.5 10*3/uL (ref 1.7–7.7)
Neutrophils Relative %: 74 %
PLATELETS: 433 10*3/uL — AB (ref 150–400)
RBC: 4.62 MIL/uL (ref 3.87–5.11)
RDW: 13.1 % (ref 11.5–15.5)
WBC: 7.5 10*3/uL (ref 4.0–10.5)

## 2017-07-17 LAB — HCG, QUANTITATIVE, PREGNANCY: hCG, Beta Chain, Quant, S: 42986 m[IU]/mL — ABNORMAL HIGH (ref <5)

## 2017-07-17 LAB — CBG MONITORING, ED: GLUCOSE-CAPILLARY: 177 mg/dL — AB (ref 70–99)

## 2017-07-17 LAB — MAGNESIUM: MAGNESIUM: 1.9 mg/dL (ref 1.7–2.4)

## 2017-07-17 MED ORDER — ONDANSETRON HCL 4 MG/2ML IJ SOLN: 4.0000 mg | Freq: Once | INTRAMUSCULAR | Status: DC

## 2017-07-17 MED ORDER — LACTATED RINGERS IV BOLUS
1000.0000 mL | Freq: Once | INTRAVENOUS | Status: AC
  Administered 2017-07-17: 1000 mL via INTRAVENOUS

## 2017-07-17 MED ORDER — SODIUM CHLORIDE 0.9 % IV BOLUS
1000.0000 mL | Freq: Once | INTRAVENOUS | Status: AC
  Administered 2017-07-17: 1000 mL via INTRAVENOUS

## 2017-07-17 MED ORDER — ONDANSETRON 4 MG PO TBDP: 4.0000 mg | ORAL_TABLET | Freq: Three times a day (TID) | ORAL | 0 refills | Status: DC | PRN

## 2017-07-17 MED ORDER — PROMETHAZINE HCL 25 MG/ML IJ SOLN
25.0000 mg | Freq: Once | INTRAMUSCULAR | Status: AC
  Administered 2017-07-17: 25 mg via INTRAVENOUS
  Filled 2017-07-17: qty 1

## 2017-07-17 MED ORDER — SODIUM CHLORIDE 0.9 % IV BOLUS: 1000.0000 mL | Freq: Once | INTRAVENOUS | Status: DC

## 2017-07-17 MED ORDER — PROMETHAZINE HCL 25 MG RE SUPP: 25.0000 mg | Freq: Four times a day (QID) | RECTAL | 0 refills | Status: DC | PRN

## 2017-07-17 MED ORDER — ONDANSETRON 4 MG PO TBDP
4.0000 mg | ORAL_TABLET | Freq: Once | ORAL | Status: AC | PRN
  Administered 2017-07-17: 4 mg via ORAL
  Filled 2017-07-17: qty 1

## 2017-07-17 MED ORDER — DOXYLAMINE-PYRIDOXINE 10-10 MG PO TBEC: DELAYED_RELEASE_TABLET | ORAL | 0 refills | Status: DC

## 2017-07-17 NOTE — ED Triage Notes (Signed)
Patient here from home with complaints of [redacted] weeks pregnant, unable to keep anything down. Concerned about dehydration. Type 1 diabetic.

## 2017-07-17 NOTE — Discharge Instructions (Signed)
Nausea and vomiting in early pregnancy is very common. See the list of foods below to help with this issue. Stay well hydrated, continue taking all of your usual home medications as directed. Check your sugars frequently and take your insulin as directed. Use diclegis as directed to help with nausea; if this isn't helping, use zofran as directed; if this is still not helping, try using phenergan as directed. You can also try Gin-Gins or ginger tea/ginger products to help with symptoms. There's also a product called Nauzene that can help with nausea. You may consider any other over the counter medications for nausea that are safe in pregnancy; if you're not sure if it's safe in pregnancy, ask the pharmacist. Follow up with your OBGYN in 3-5 days for recheck of symptoms. [facility] the [facility] (similar to their version of the ER) for emergent changes or worsening symptoms.

## 2017-07-17 NOTE — ED Notes (Signed)
IV Ultrasound requested.

## 2017-07-17 NOTE — ED Notes (Signed)
Applesauce and sprite have been given to patient in order to attempt consumption to facilitate in discharge from facility.

## 2017-07-17 NOTE — ED Provider Notes (Signed)
[facility] DEPT Provider Note   CSN: [id number] Arrival date & time: 07/17/17  1147     History   Chief Complaint Chief Complaint  Patient presents with  . Emesis During Pregnancy    HPI Barbara Donovan is a 27 y.o. G2P0010 female currently [redacted]w[redacted]d pregnant based on 8wk U/S (per pt report and office visit from 07/07/17 reporting dates at [redacted]w[redacted]d), with a PMHx of DM1 and HTN, who presents to the ED with complaints of nausea and vomiting since yesterday.  She states that she had 3 episodes today and 3 episodes yesterday of nonbloody nonbilious emesis accompanied by chills.  She has been trying vitamin B6, sea band bracelet, and her prenatal vitamins with no relief of her symptoms.  Her symptoms worsen with trying to eat anything.  She denies fevers, CP, SOB, abd pain, diarrhea/constipation, obstipation, melena, hematochezia, hematemesis, hematuria, dysuria, vaginal bleeding/discharge, myalgias, arthralgias, numbness, tingling, focal weakness, or any other complaints at this time. Denies recent travel, sick contacts, suspicious food intake, EtOH use, NSAID use, or prior abd surgeries.  She had a confirmatory U/S at 8wks which confirmed IUP, those results are not showing up in our system but it was from 06/14/17.  Her LMP was 04/02/17 (which would estimate her at [redacted]w[redacted]d, slightly farther along than what she is stating her dates are).  Her OBGYN is Dr. Lorelei Pont at [facility].  She reports that she takes lantus and novolog for her diabetes; her sugars have been in the 110-160s in this last day since her symptoms started; she hasn't had any sugars over 200 this week.   The history is provided by the patient and medical records. No language interpreter was used.    Past Medical History:  Diagnosis Date  . Diabetes ([place])    TYPE I  . Hypertension complicating pregnancy     Patient Active Problem List   Diagnosis Date Noted  . Diabetes ([place]) 06/09/2017    Past  Surgical History:  Procedure Laterality Date  . WISDOM TOOTH EXTRACTION       OB History    Gravida  1   Para      Term      Preterm      AB      Living        SAB      TAB      Ectopic      Multiple      Live Births               Home Medications    Prior to Admission medications   Medication Sig Start Date End Date Taking? Authorizing Provider  aspirin 81 MG chewable tablet Chew by mouth daily.    [provider]  glucose blood (ONETOUCH VERIO) test strip Used to check blood sugars five times daily. 06/09/17   Renato Shin, MD  insulin aspart (NOVOLOG FLEXPEN) 100 UNIT/ML FlexPen Inject 7 Units into the skin 3 (three) times daily with meals. And pen needles 4/day 06/09/17   Renato Shin, MD  Insulin Glargine (BASAGLAR KWIKPEN) 100 UNIT/ML SOPN Inject 0.28 mLs (28 Units total) into the skin at bedtime. 06/24/17   Renato Shin, MD  Lancets [facility] ULTRASOFT) lancets Used to check blood sugars five times daily. 06/09/17   Renato Shin, MD    Family History Family History  Problem Relation Age of Onset  . Diabetes Mother   . Diabetes Father     Social History Social  History   Tobacco Use  . Smoking status: Never Smoker  . Smokeless tobacco: Never Used  Substance Use Topics  . Alcohol use: Not Currently  . Drug use: Never     Allergies   Patient has no known allergies.   Review of Systems Review of Systems  Constitutional: Positive for chills. Negative for fever.  Respiratory: Negative for shortness of breath.   Cardiovascular: Negative for chest pain.  Gastrointestinal: Positive for nausea and vomiting. Negative for abdominal pain, blood in stool, constipation and diarrhea.  Genitourinary: Negative for dysuria, hematuria, vaginal bleeding and vaginal discharge.  Musculoskeletal: Negative for arthralgias and myalgias.  Skin: Negative for color change.  Allergic/Immunologic: Positive for immunocompromised state (DM1).  Neurological:  Negative for weakness and numbness.  Psychiatric/Behavioral: Negative for confusion.   All other systems reviewed and are negative for acute change except as noted in the HPI.    Physical Exam Updated Vital Signs BP (!) 152/88   Pulse 92   Temp 98.3 F (36.8 C) (Oral)   Resp (!) 9   Ht 5\' 6"  (1.676 m)   Wt 77.1 kg (170 lb)   LMP 04/02/2017 (Approximate)   SpO2 100%   BMI 27.44 kg/m    Physical Exam  Constitutional: She is oriented to person, place, and time. Vital signs are normal. She appears well-developed and well-nourished.  Non-toxic appearance. No distress.  Afebrile, nontoxic, NAD  HENT:  Head: Normocephalic and atraumatic.  Mouth/Throat: Oropharynx is clear and moist. Mucous membranes are dry.  Lips dry  Eyes: Conjunctivae and EOM are normal. Right eye exhibits no discharge. Left eye exhibits no discharge.  Neck: Normal range of motion. Neck supple.  Cardiovascular: Normal rate, regular rhythm, normal heart sounds and intact distal pulses. Exam reveals no gallop and no friction rub.  No murmur heard. Pulmonary/Chest: Effort normal and breath sounds normal. No respiratory distress. She has no decreased breath sounds. She has no wheezes. She has no rhonchi. She has no rales.  Abdominal: Soft. Normal appearance and bowel sounds are normal. She exhibits no distension. There is no tenderness. There is no rigidity, no rebound, no guarding, no CVA tenderness, no tenderness at McBurney's point and negative Murphy's sign.  Soft, NTND, +BS throughout, no r/g/r, neg murphy's, neg mcburney's, no CVA TTP   Musculoskeletal: Normal range of motion.  Neurological: She is alert and oriented to person, place, and time. She has normal strength. No sensory deficit.  Skin: Skin is warm, dry and intact. No rash noted.  Psychiatric: She has a normal mood and affect.  Nursing note and vitals reviewed.    ED Treatments / Results  Labs (all labs ordered are listed, but only abnormal results  are displayed) Labs Reviewed  COMPREHENSIVE METABOLIC PANEL - Abnormal; Notable for the following components:      Result Value   Glucose, Bld 168 (*)    Calcium 8.8 (*)    Albumin 2.8 (*)    Alkaline Phosphatase 36 (*)    All other components within normal limits  URINALYSIS, ROUTINE W REFLEX MICROSCOPIC - Abnormal; Notable for the following components:   Glucose, UA >=500 (*)    Hgb urine dipstick MODERATE (*)    Ketones, ur 80 (*)    Protein, ur >=300 (*)    Bacteria, UA RARE (*)    All other components within normal limits  HCG, QUANTITATIVE, PREGNANCY - Abnormal; Notable for the following components:   hCG, Beta Chain, America Brown, S 42,986 (*)  All other components within normal limits  CBC WITH DIFFERENTIAL/PLATELET - Abnormal; Notable for the following components:   Platelets 433 (*)    All other components within normal limits  BLOOD GAS, VENOUS - Abnormal; Notable for the following components:   pCO2, Ven 41.8 (*)    All other components within normal limits  CBG MONITORING, ED - Abnormal; Notable for the following components:   Glucose-Capillary 177 (*)    All other components within normal limits  I-STAT CHEM 8, ED - Abnormal; Notable for the following components:   Sodium 134 (*)    Glucose, Bld 166 (*)    Calcium, Ion 1.13 (*)    All other components within normal limits  LIPASE, BLOOD  MAGNESIUM  CBG MONITORING, ED    EKG None  Radiology No results found.  Procedures Procedures (including critical care time)  Medications Ordered in ED Medications  ondansetron (ZOFRAN-ODT) disintegrating tablet 4 mg (4 mg Oral Given 07/17/17 1309)  lactated ringers bolus 1,000 mL (0 mLs Intravenous Stopped 07/17/17 1647)  promethazine (PHENERGAN) injection 25 mg (25 mg Intravenous Given 07/17/17 1656)  sodium chloride 0.9 % bolus 1,000 mL (1,000 mLs Intravenous New Bag/Given 07/17/17 1715)     Initial Impression / Assessment and Plan / ED Course  I have reviewed the  triage vital signs and the nursing notes.  Pertinent labs & imaging results that were available during my care of the patient were reviewed by me and considered in my medical decision making (see chart for details).     27 y.o. female here with n/v x1 day, currently ~[redacted]wks pregnant based on 8wk U/S (result not in our system). On exam, no abdominal tenderness, afebrile and nontoxic, lips dry. Will get labs, doubt need for imaging. Will give LR, pt received ODT zofran and feels improved currently, will reassess shortly.   5:10 PM Chem 8 with gluc 166 but otherwise fairly unremarkable. CBC w/diff with marginally elevated plt 433 but otherwise WNL. CMP with gluc 168 but no bicarb change or anion gap elevation, low albumin 2.8 but otherwise fairly unremarkable. Lipase WNL. QuantHCG 37,169. VBG WNL. Mg level WNL. U/A with 80 ketones likely more from dehydration rather than DKA given reassuring VBG and CMP, somewhat contaminated but no evidence of UTI; proteinuria appears to be chronic based on reviewing [facility] records. Doubt need for UCx given that this sample is grossly contaminated, likely not going to be very helpful to culture.  Pt initially started having return of symptoms, so we have given phenergan and that has helped somewhat; will attempt PO challenge and reassess. Will give more fluids since her initial bolus is completed.   7:13 PM Pt feeling improved and tolerating PO well. Overall, symptoms likely from early pregnancy related n/v. Doubt need for further emergent work up at this time. Will send home with diclegis, zofran ODT, and phenergan suppositories so she has multiple meds to try to help with her symptoms. Advised adequate hydration and rest, continuation of all home meds, checking sugars regularly; advised other OTC remedies for symptomatic relief; given eating plan for hyperemesis; f/up with OBGYN in 3-5 days for recheck; strict return precautions advised, discussed that if symptoms persist  despite meds, to go to [facility]. I explained the diagnosis and have given explicit precautions to return to the ER including for any other new or worsening symptoms. The patient understands and accepts the medical plan as it's been dictated and I have answered their questions. Discharge instructions concerning  home care and prescriptions have been given. The patient is STABLE and is discharged to home in good condition.    Final Clinical Impressions(s) / ED Diagnoses   Final diagnoses:  Nausea and vomiting during pregnancy prior to [redacted] weeks gestation  Hyperemesis gravidarum  Hyperglycemia due to type 1 diabetes mellitus [facility])    ED Discharge Orders        Ordered    Doxylamine-Pyridoxine 10-10 MG TBEC     07/17/17 1911    ondansetron (ZOFRAN ODT) 4 MG disintegrating tablet  Every 8 hours PRN     07/17/17 1911    promethazine (PHENERGAN) 25 MG suppository  Every 6 hours PRN     07/17/17 [address] [place], [place] 07/17/17 1914    Isla Pence, MD 07/20/17 [phone]

## 2017-07-17 NOTE — ED Triage Notes (Signed)
PT STS HER N/V RETURNED YESTERDAY. NO NAUSEA MEDICINE TO TAKE, BUT HAS BEEN TRYING TO KEEP TAKING HER PRENATALS. PT'S BS AT HOME WAS 165. PT DENIES ABDOMINAL PAIN. PT HAS A NORMAL BM THIS MORNING.

## 2017-07-29 ENCOUNTER — Telehealth: Payer: Self-pay | Admitting: Endocrinology

## 2017-07-29 NOTE — Telephone Encounter (Signed)
Spoke to Barbara Donovan and informed her that I have not received anything from them, and asked her to re-fax it attn to me

## 2017-07-29 NOTE — Telephone Encounter (Signed)
[name] [facility] ph# [phone] called re: has the fax they sent over for a continuous glucose monitor been received by our office. Please call above ph# to notify.

## 2017-08-03 ENCOUNTER — Encounter (INDEPENDENT_AMBULATORY_CARE_PROVIDER_SITE_OTHER): Payer: Self-pay | Admitting: Ophthalmology

## 2017-08-03 ENCOUNTER — Ambulatory Visit (INDEPENDENT_AMBULATORY_CARE_PROVIDER_SITE_OTHER): Payer: BC Managed Care – PPO | Admitting: Ophthalmology

## 2017-08-03 VITALS — BP 136/87 | HR 94

## 2017-08-03 DIAGNOSIS — E103313 Type 1 diabetes mellitus with moderate nonproliferative diabetic retinopathy with macular edema, bilateral: Secondary | ICD-10-CM | POA: Diagnosis not present

## 2017-08-03 DIAGNOSIS — H3581 Retinal edema: Secondary | ICD-10-CM

## 2017-08-03 DIAGNOSIS — Z3A15 15 weeks gestation of pregnancy: Secondary | ICD-10-CM

## 2017-08-03 DIAGNOSIS — H34813 Central retinal vein occlusion, bilateral, with macular edema: Secondary | ICD-10-CM

## 2017-08-03 DIAGNOSIS — H35033 Hypertensive retinopathy, bilateral: Secondary | ICD-10-CM | POA: Diagnosis not present

## 2017-08-03 NOTE — Progress Notes (Addendum)
[facility] Note  08/03/2017     CHIEF COMPLAINT Patient presents for Retina Evaluation   HISTORY OF PRESENT ILLNESS: Barbara Donovan is a 27 y.o. female who presents to the clinic today for:   HPI    Retina Evaluation    In both eyes.  Duration of 2 hours.  Associated Symptoms Negative for Flashes, Blind Spot, Photophobia, Scalp Tenderness, Fever, Weight Loss, Jaw Claudication, Glare, Pain, Floaters, Distortion, Redness, Trauma, Shoulder/Hip pain and Fatigue.  Context:  distance vision, mid-range vision and near vision.  Treatments tried include no treatments.  I, the attending physician,  performed the HPI with the patient and updated documentation appropriately.          Comments    Pt presents on the referral of Dr. Jerline Pain for possible CRVO OU, pt is [redacted] weeks pregnant and noticed her VA change a couple of weeks ago, she was referred to Dr. Jerline Pain by her maternal fetal care dr, pt states she got new glasses in March, she is not having any flashes, floaters, pain or wavy vision, pt was prescribed Pred Forte for inflammation but only uses them as needed, pt is type 1 diabetic, pt was dx 16 yrs ago, last A1C was 10.9 last month, pt checked BS a few minutes ago and it was 108       Last edited by Bernarda Caffey, MD on 08/03/2017 11:45 AM. (History)    Pt states she began noticing VA changes 2 weeks ago; Pt states she check BP at home and states it was in the 150s; Pt states she has had no other issues with current pregnancy other than morning sickness;   Referring physician: Shawnie Dapper, DO [address] [place], [place] [address]  HISTORICAL INFORMATION:   Selected notes from the MEDICAL RECORD NUMBER Referred by Dr. [name] for concern of bilateral CRVO LEE-  Ocular Hx-  PMH-     CURRENT MEDICATIONS: No current outpatient medications on file. (Ophthalmic Drugs)   No current facility-administered medications for this visit.  (Ophthalmic Drugs)    Current Outpatient Medications (Other)  Medication Sig  . aspirin 81 MG chewable tablet Chew 81 mg by mouth daily.   . Doxylamine-Pyridoxine 10-10 MG TBEC 2 tablets on an empty stomach at bedtime on day 1 and 2. If symptoms persist, take 1 tablet in the morning and 2 tablets at bedtime on day 3. If symptoms persist, increase to 1 tablet in the morning, 1 tablet in the afternoon, and 2 tablets at bedtime daily.  Marland Kitchen glucose blood (ONETOUCH VERIO) test strip Used to check blood sugars five times daily.  . insulin aspart (NOVOLOG FLEXPEN) 100 UNIT/ML FlexPen Inject 7 Units into the skin 3 (three) times daily with meals. And pen needles 4/day  . Insulin Glargine (BASAGLAR KWIKPEN) 100 UNIT/ML SOPN Inject 0.28 mLs (28 Units total) into the skin at bedtime.  . Lancets (ONETOUCH ULTRASOFT) lancets Used to check blood sugars five times daily.  . ondansetron (ZOFRAN ODT) 4 MG disintegrating tablet Take 1 tablet (4 mg total) by mouth every 8 (eight) hours as needed for nausea or vomiting.  . Prenatal Vit-Fe Fumarate-FA (PRENATAL MULTIVITAMIN) TABS tablet Take 1 tablet by mouth daily at 12 noon.  . promethazine (PHENERGAN) 25 MG suppository Place 1 suppository (25 mg total) rectally every 6 (six) hours as needed for nausea or vomiting.   No current facility-administered medications for this visit.  (Other)  REVIEW OF SYSTEMS: ROS    Positive for: Endocrine, Cardiovascular, Eyes   Negative for: Constitutional, Gastrointestinal, Neurological, Skin, Genitourinary, Musculoskeletal, HENT, Respiratory, Psychiatric, Allergic/Imm, Heme/Lymph   Last edited by Cherrie Gauze, COA on 08/03/2017 12:00 PM. (History)       ALLERGIES No Known Allergies  PAST MEDICAL HISTORY Past Medical History:  Diagnosis Date  . Diabetes ([place])    TYPE I  . Hypertension complicating pregnancy    Past Surgical History:  Procedure Laterality Date  . WISDOM TOOTH EXTRACTION      FAMILY HISTORY Family History   Problem Relation Age of Onset  . Diabetes Mother   . Diabetes Father   . Cataracts Father   . Amblyopia Neg Hx   . Blindness Neg Hx   . Glaucoma Neg Hx   . Macular degeneration Neg Hx   . Retinal detachment Neg Hx   . Strabismus Neg Hx   . Retinitis pigmentosa Neg Hx     SOCIAL HISTORY Social History   Tobacco Use  . Smoking status: Never Smoker  . Smokeless tobacco: Never Used  Substance Use Topics  . Alcohol use: Not Currently  . Drug use: Never         OPHTHALMIC EXAM:  Base Eye Exam    Visual Acuity (Snellen - Linear)      Right Left   Dist cc 20/50 20/50   Dist ph cc NI 20/30   Correction:  Glasses       Tonometry (Tonopen, 11:18 AM)      Right Left   Pressure 18 15       Pupils      Dark Light Shape React APD   Right 5 5 Round NR None   Left 5 5 Round NR None       Visual Fields (Counting fingers)      Left Right    Full Full       Extraocular Movement      Right Left    Full, Ortho Full, Ortho       Neuro/Psych    Oriented x3:  Yes   Mood/Affect:  Normal       Dilation    Both eyes:  1.0% Mydriacyl, 2.5% Phenylephrine @ 11:18 AM        Slit Lamp and Fundus Exam    Slit Lamp Exam      Right Left   Lids/Lashes Normal Normal   Conjunctiva/Sclera White and quiet White and quiet   Cornea Clear Clear   Anterior Chamber Deep and quiet Deep and quiet   Iris Round and moderately dilated, No NVI Round and moderately dilated, No NVI   Lens Trace Nuclear sclerosis and cortical changes Trace Nuclear sclerosis and cortical changes   Vitreous Normal Normal       Fundus Exam      Right Left   Disc Pink and Sharp; +SVP Pink and Sharp   C/D Ratio 0.55 0.5   Macula Blunted foveal reflex, central edema, +IRH, +CWS consistent with CRVO Blunted foveal reflex, central edema, +IRH consistent with CRVO   Vessels Tortuous, Copper wiring, AV crossing changes, veins dilated Tortuous, Copper wiring, AV crossing changes, veins dilated   Periphery  Attached, 360 IRH about the disc, scattered CWS posteriorly, IRH does not spread anteriorly Attached, 360 IRH about the disc, scattered CWS posteriorly, IRH does not spread anteriorly, CWS nasal to disc        Refraction    Manifest Refraction (Auto)  Sphere Cylinder Axis   Right -1.75 +0.25 012   Left -1.75 +0.50 002          IMAGING AND PROCEDURES  Imaging and Procedures for @TODAY @  OCT, Retina - OU - Both Eyes       Right Eye Quality was good. Central Foveal Thickness: 767. Progression has no prior data. Findings include abnormal foveal contour, intraretinal fluid, subretinal fluid (Massive CME with +SRF).   Left Eye Quality was good. Central Foveal Thickness: 627. Progression has no prior data. Findings include intraretinal fluid, subretinal fluid, abnormal foveal contour.   Notes *Images captured and stored on drive  Diagnosis / Impression:  CRVO with massive CME OU, OD slightly worse than OS   Clinical management:  See below  Abbreviations: NFP - Normal foveal profile. CME - cystoid macular edema. PED - pigment epithelial detachment. IRF - intraretinal fluid. SRF - subretinal fluid. EZ - ellipsoid zone. ERM - epiretinal membrane. ORA - outer retinal atrophy. ORT - outer retinal tubulation. SRHM - subretinal hyper-reflective material                  ASSESSMENT/PLAN:    ICD-10-CM   1. 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 Retinal edema H35.81 OCT, Retina - OU - Both Eyes  3. [redacted] weeks gestation of pregnancy Z3A.15   4. 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 Hypertensive retinopathy of both eyes H35.033     1,2. 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 - pt reports symptoms of decreased vision started 2 wks ago - BP elevated to 180s / 100s at Dr. [name] office, but 25 / 62 in our office - concern for pregnancy-related vasculopathy, coagulopathy - immediate hospital admission per OB/GYN for further work up and management of systemic pregnancy-related issues - discussed treatment options and unknown risks of intravitreal injection to fetus -- recommend observation for now -- pt in agreement - F/U 3-4 weeks  3. [redacted] wks gestation pregnancy - pt to report directly to [facility] unit at [facility] immediately following discharge here for further evaluation and management  4. Moderate nonproliferative diabetic retinopathy w/ DME, both eyes - The incidence, risk factors for progression, natural history and treatment options for diabetic retinopathy were discussed with patient.   - The need for close monitoring of blood glucose, blood pressure, and serum lipids, avoiding cigarette or any type of tobacco, and the need for long term follow up was also discussed with patient. - diabetic exam complicated by CRVO related hemorrhages and pathology - will need more extensive work up following pregnancy  5. Hypertensive retinopathy OU - concern for pregnancy related hypertension (I.e. Pre-eclampsia, Eclampsia, HELLP, etc) - discussed importance of tight BP control - monitor     Ophthalmic Meds Ordered this visit:  No orders of the defined types were placed in this encounter.      Return in about 3 weeks (around 08/24/2017) for F/U CRVO OU, DFE, OCT.  There are no Patient Instructions on file for this visit.   Explained the diagnoses, plan, and  follow up with the patient and they expressed understanding.  Patient  expressed understanding of the importance of proper follow up care.  This document serves as a record of services personally performed by Gardiner Sleeper, MD, PhD. It was created on their behalf by Catha Brow, [place], a certified ophthalmic assistant. The creation of this record is the provider's dictation and/or activities during the visit.  Electronically signed by: Catha Brow, [place]  07.17.19 4:29 PM    Gardiner Sleeper, M.D., Ph.D. Diseases & Surgery of the Retina and Vitreous [facility]   I have reviewed the above documentation for accuracy and completeness, and I agree with the above. Gardiner Sleeper, M.D., Ph.D. 08/03/17 4:29 PM    Abbreviations: M myopia (nearsighted); A astigmatism; H hyperopia (farsighted); P presbyopia; Mrx spectacle prescription;  CTL contact lenses; OD right eye; OS left eye; OU both eyes  XT exotropia; ET esotropia; PEK punctate epithelial keratitis; PEE punctate epithelial erosions; DES dry eye syndrome; MGD meibomian gland dysfunction; ATs artificial tears; PFAT's preservative free artificial tears; [place] nuclear sclerotic cataract; PSC posterior subcapsular cataract; ERM epi-retinal membrane; PVD posterior vitreous detachment; RD retinal detachment; DM diabetes mellitus; DR diabetic retinopathy; NPDR non-proliferative diabetic retinopathy; PDR proliferative diabetic retinopathy; CSME clinically significant macular edema; DME diabetic macular edema; dbh dot blot hemorrhages; CWS cotton wool spot; POAG primary open angle glaucoma; C/D cup-to-disc ratio; HVF humphrey visual field; GVF goldmann visual field; OCT optical coherence tomography; IOP intraocular pressure; BRVO Branch retinal vein occlusion; CRVO central retinal vein occlusion; CRAO central retinal artery occlusion; BRAO branch retinal artery occlusion; RT retinal tear; SB scleral buckle; PPV pars plana vitrectomy; VH Vitreous hemorrhage; PRP panretinal laser photocoagulation; IVK  intravitreal kenalog; VMT vitreomacular traction; MH Macular hole;  NVD neovascularization of the disc; NVE neovascularization elsewhere; AREDS age related eye disease study; ARMD age related macular degeneration; POAG primary open angle glaucoma; EBMD epithelial/anterior basement membrane dystrophy; ACIOL anterior chamber intraocular lens; IOL intraocular lens; PCIOL posterior chamber intraocular lens; Phaco/IOL phacoemulsification with intraocular lens placement; [place] photorefractive keratectomy; LASIK laser assisted in situ keratomileusis; HTN hypertension; DM diabetes mellitus; COPD chronic obstructive pulmonary disease

## 2017-08-05 ENCOUNTER — Other Ambulatory Visit: Payer: Self-pay

## 2017-08-05 ENCOUNTER — Telehealth: Payer: Self-pay | Admitting: Endocrinology

## 2017-08-05 MED ORDER — INSULIN ASPART 100 UNIT/ML FLEXPEN: 7.0000 [IU] | PEN_INJECTOR | Freq: Three times a day (TID) | SUBCUTANEOUS | 0 refills | Status: DC

## 2017-08-05 MED ORDER — INSULIN ASPART 100 UNIT/ML FLEXPEN: PEN_INJECTOR | SUBCUTANEOUS | 0 refills | Status: DC

## 2017-08-05 MED ORDER — BASAGLAR KWIKPEN 100 UNIT/ML ~~LOC~~ SOPN: 28.0000 [IU] | PEN_INJECTOR | Freq: Every day | SUBCUTANEOUS | 0 refills | Status: DC

## 2017-08-05 NOTE — Telephone Encounter (Signed)
I am unaware of patient's settings & how much novolog to send in for pump. Please advise?

## 2017-08-05 NOTE — Telephone Encounter (Signed)
Ov is needed for this.  Today or 08/09/17

## 2017-08-05 NOTE — Telephone Encounter (Signed)
Patient stated that she received her pump but did not receive a prescription for the insulin Please advise

## 2017-08-05 NOTE — Telephone Encounter (Signed)
I have called patient & she is set up to see Vaughan Basta week after next. I have sent 5 pens of basaglar & novolog to patient's pharmacy.

## 2017-08-17 ENCOUNTER — Ambulatory Visit: Payer: BC Managed Care – PPO | Admitting: Endocrinology

## 2017-08-17 ENCOUNTER — Encounter: Payer: BC Managed Care – PPO | Attending: Endocrinology | Admitting: Nutrition

## 2017-08-17 ENCOUNTER — Telehealth: Payer: Self-pay | Admitting: Emergency Medicine

## 2017-08-17 ENCOUNTER — Encounter: Payer: Self-pay | Admitting: Endocrinology

## 2017-08-17 VITALS — BP 124/76 | HR 102 | Temp 98.5°F | Ht 66.0 in | Wt 167.0 lb

## 2017-08-17 DIAGNOSIS — E109 Type 1 diabetes mellitus without complications: Secondary | ICD-10-CM

## 2017-08-17 DIAGNOSIS — E1065 Type 1 diabetes mellitus with hyperglycemia: Secondary | ICD-10-CM

## 2017-08-17 NOTE — Progress Notes (Signed)
Subjective:    Patient ID: Barbara Donovan, female    DOB: Apr 09, 1990, 27 y.o.   MRN: [id number]  HPI Pt returns for f/u of diabetes mellitus: DM type: 1 Dx'ed: 9629 Complications: none Therapy: insulin since 2009 DKA: last episode was 2015.  Severe hypoglycemia: never Pancreatitis: never Pancreatic imaging: never.  Other: she takes multiple daily injections. Interval history:  She is at 17 weeks.  She has lost 8 lbs since last ov.  Meter is downloaded today, and the printout is scanned into the record.  cbg varies from 60-270.  There is no trend throughout the day.  She has obtained pump, but hasn't started using it.   Past Medical History:  Diagnosis Date  . Diabetes ([place])    TYPE I  . Hypertension complicating pregnancy     Past Surgical History:  Procedure Laterality Date  . WISDOM TOOTH EXTRACTION      Social History   Socioeconomic History  . Marital status: Single    Spouse name: Not on file  . Number of children: Not on file  . Years of education: Not on file  . Highest education level: Not on file  Occupational History  . Not on file  Social Needs  . Financial resource strain: Not on file  . Food insecurity:    Worry: Not on file    Inability: Not on file  . Transportation needs:    Medical: Not on file    Non-medical: Not on file  Tobacco Use  . Smoking status: Never Smoker  . Smokeless tobacco: Never Used  Substance and Sexual Activity  . Alcohol use: Not Currently  . Drug use: Never  . Sexual activity: Yes  Lifestyle  . Physical activity:    Days per week: Not on file    Minutes per session: Not on file  . Stress: Not on file  Relationships  . Social connections:    Talks on phone: Not on file    Gets together: Not on file    Attends religious service: Not on file    Active member of club or organization: Not on file    Attends meetings of clubs or organizations: Not on file    Relationship status: Not on file  . Intimate partner  violence:    Fear of current or ex partner: Not on file    Emotionally abused: Not on file    Physically abused: Not on file    Forced sexual activity: Not on file  Other Topics Concern  . Not on file  Social History Narrative  . Not on file    Current Outpatient Medications on File Prior to Visit  Medication Sig Dispense Refill  . aspirin 81 MG chewable tablet Chew 81 mg by mouth daily.     . Doxylamine-Pyridoxine 10-10 MG TBEC 2 tablets on an empty stomach at bedtime on day 1 and 2. If symptoms persist, take 1 tablet in the morning and 2 tablets at bedtime on day 3. If symptoms persist, increase to 1 tablet in the morning, 1 tablet in the afternoon, and 2 tablets at bedtime daily. 60 tablet 0  . glucose blood (ONETOUCH VERIO) test strip Used to check blood sugars five times daily. 150 each 12  . insulin aspart (NOVOLOG FLEXPEN) 100 UNIT/ML FlexPen Inject 7 units into the skin three times daily with meals. 15 mL 0  . Insulin Glargine (BASAGLAR KWIKPEN) 100 UNIT/ML SOPN Inject 0.28 mLs (28 Units total) into the skin at  bedtime. 5 pen 0  . Lancets (ONETOUCH ULTRASOFT) lancets Used to check blood sugars five times daily. 200 each 12  . ondansetron (ZOFRAN ODT) 4 MG disintegrating tablet Take 1 tablet (4 mg total) by mouth every 8 (eight) hours as needed for nausea or vomiting. 15 tablet 0  . Prenatal Vit-Fe Fumarate-FA (PRENATAL MULTIVITAMIN) TABS tablet Take 1 tablet by mouth daily at 12 noon.    . sertraline (ZOLOFT) 50 MG tablet Take by mouth.     No current facility-administered medications on file prior to visit.     No Known Allergies  Family History  Problem Relation Age of Onset  . Diabetes Mother   . Diabetes Father   . Cataracts Father   . Amblyopia Neg Hx   . Blindness Neg Hx   . Glaucoma Neg Hx   . Macular degeneration Neg Hx   . Retinal detachment Neg Hx   . Strabismus Neg Hx   . Retinitis pigmentosa Neg Hx     BP 124/76 (BP Location: Left Arm, Patient Position:  Sitting, Cuff Size: Normal)   Pulse (!) 102   Temp 98.5 F (36.9 C) (Oral)   Ht 5\' 6"  (1.676 m)   Wt 167 lb (75.8 kg)   LMP 04/02/2017 (Approximate)   SpO2 99%   BMI 26.95 kg/m    Review of Systems Denies LOC    Objective:   Physical Exam VITAL SIGNS:  See vs page GENERAL: no distress Pulses: foot pulses are intact bilaterally.   MSK: no deformity of the feet or ankles.  CV: no edema of the legs or ankles Skin:  no ulcer on the feet or ankles.  normal color and temp on the feet and ankles.   Neuro: sensation is intact to touch on the feet and ankles.     Lab Results  Component Value Date   HGBA1C 11.9 (A) 06/24/2017       Assessment & Plan:  Type 1 DM: she needs increased rx.   Noncompliance with f/u appts: this complicates the rx of DM.   Pregnancy: she needs aggresive glycemic control.     Patient Instructions  check your blood sugar 5 times a day, before the 3 meals, at bedtime, and after meals on a rotating basis.  also check if you have symptoms of your blood sugar being too high or too low.  please keep a record of the readings and bring it to your next appointment here (or you can bring the meter itself).  You can write it on any piece of paper.  please call us sooner if your blood sugar goes below 70, or if you have a lot of readings over 200.  When you start the pump, please take these settings: basal rate of 0.7units/hr.   bolus of 1 unit/10 grams carbohydrate continue correction bolus (which some people call "sensitivity," or "insulin sensitivity ratio," or just "isr") of 1 unit for each 30 by which your glucose exceeds 100.   Please come back for a follow-up appointment in 2 weeks

## 2017-08-17 NOTE — Patient Instructions (Addendum)
check your blood sugar 5 times a day, before the 3 meals, at bedtime, and after meals on a rotating basis.  also check if you have symptoms of your blood sugar being too high or too low.  please keep a record of the readings and bring it to your next appointment here (or you can bring the meter itself).  You can write it on any piece of paper.  please call us sooner if your blood sugar goes below 70, or if you have a lot of readings over 200.  When you start the pump, please take these settings: basal rate of 0.7units/hr.   bolus of 1 unit/10 grams carbohydrate continue correction bolus (which some people call "sensitivity," or "insulin sensitivity ratio," or just "isr") of 1 unit for each 30 by which your glucose exceeds 100.   Please come back for a follow-up appointment in 2 weeks

## 2017-08-17 NOTE — Telephone Encounter (Signed)
Called and left patient a VM letting her know her appt is on 09/01/17 at 3:45 with Dr Loanne Drilling. Also let her know if she is unable to make that appt to please give [name] a call toy reschedule.

## 2017-08-17 NOTE — Telephone Encounter (Signed)
3:45 PM, 09/01/17

## 2017-08-17 NOTE — Telephone Encounter (Signed)
You have nothing available on your schedule. Do you see anywhere you can fit her in the next couple weeks & will get her scheduled?

## 2017-08-17 NOTE — Telephone Encounter (Signed)
Pt was checking out for appt. You wanted her to come back in 2 weeks. There is nothing in 2 weeks or anywhere close to that. Would you like to work her in somewhere?

## 2017-08-19 ENCOUNTER — Inpatient Hospital Stay (HOSPITAL_COMMUNITY)
Admission: AD | Admit: 2017-08-19 | Discharge: 2017-08-19 | Disposition: A | Discharge status: Home / Self Care | Payer: BC Managed Care – PPO | Source: Ambulatory Visit | Attending: Obstetrics & Gynecology | Admitting: Obstetrics & Gynecology

## 2017-08-19 ENCOUNTER — Encounter (HOSPITAL_COMMUNITY): Payer: Self-pay | Admitting: *Deleted

## 2017-08-19 DIAGNOSIS — O24012 Pre-existing diabetes mellitus, type 1, in pregnancy, second trimester: Secondary | ICD-10-CM | POA: Insufficient documentation

## 2017-08-19 DIAGNOSIS — Z833 Family history of diabetes mellitus: Secondary | ICD-10-CM | POA: Diagnosis not present

## 2017-08-19 DIAGNOSIS — Z3A17 17 weeks gestation of pregnancy: Secondary | ICD-10-CM | POA: Insufficient documentation

## 2017-08-19 DIAGNOSIS — Z794 Long term (current) use of insulin: Secondary | ICD-10-CM | POA: Insufficient documentation

## 2017-08-19 DIAGNOSIS — O10919 Unspecified pre-existing hypertension complicating pregnancy, unspecified trimester: Secondary | ICD-10-CM

## 2017-08-19 DIAGNOSIS — O219 Vomiting of pregnancy, unspecified: Secondary | ICD-10-CM | POA: Insufficient documentation

## 2017-08-19 DIAGNOSIS — E109 Type 1 diabetes mellitus without complications: Secondary | ICD-10-CM | POA: Diagnosis not present

## 2017-08-19 DIAGNOSIS — Z79899 Other long term (current) drug therapy: Secondary | ICD-10-CM | POA: Diagnosis not present

## 2017-08-19 DIAGNOSIS — O10912 Unspecified pre-existing hypertension complicating pregnancy, second trimester: Secondary | ICD-10-CM | POA: Diagnosis not present

## 2017-08-19 DIAGNOSIS — Z7982 Long term (current) use of aspirin: Secondary | ICD-10-CM | POA: Diagnosis not present

## 2017-08-19 DIAGNOSIS — Z3400 Encounter for supervision of normal first pregnancy, unspecified trimester: Secondary | ICD-10-CM

## 2017-08-19 HISTORY — DX: Unspecified pre-existing hypertension complicating pregnancy, unspecified trimester: O10.919

## 2017-08-19 LAB — COMPREHENSIVE METABOLIC PANEL
ALT: 22 U/L (ref 0–44)
AST: 20 U/L (ref 15–41)
Albumin: 2.4 g/dL — ABNORMAL LOW (ref 3.5–5.0)
Alkaline Phosphatase: 39 U/L (ref 38–126)
Anion gap: 10 (ref 5–15)
BILIRUBIN TOTAL: 0.4 mg/dL (ref 0.3–1.2)
BUN: 10 mg/dL (ref 6–20)
CO2: 23 mmol/L (ref 22–32)
CREATININE: 0.47 mg/dL (ref 0.44–1.00)
Calcium: 8.7 mg/dL — ABNORMAL LOW (ref 8.9–10.3)
Chloride: 98 mmol/L (ref 98–111)
Glucose, Bld: 134 mg/dL — ABNORMAL HIGH (ref 70–99)
POTASSIUM: 3.8 mmol/L (ref 3.5–5.1)
Sodium: 131 mmol/L — ABNORMAL LOW (ref 135–145)
TOTAL PROTEIN: 6.1 g/dL — AB (ref 6.5–8.1)

## 2017-08-19 LAB — CBC WITH DIFFERENTIAL/PLATELET
BASOS ABS: 0 10*3/uL (ref 0.0–0.1)
BASOS PCT: 0 %
Eosinophils Absolute: 0 10*3/uL (ref 0.0–0.7)
Eosinophils Relative: 0 %
HEMATOCRIT: 36.2 % (ref 36.0–46.0)
Hemoglobin: 12.6 g/dL (ref 12.0–15.0)
Lymphocytes Relative: 21 %
Lymphs Abs: 1.7 10*3/uL (ref 0.7–4.0)
MCH: 29.5 pg (ref 26.0–34.0)
MCHC: 34.8 g/dL (ref 30.0–36.0)
MCV: 84.8 fL (ref 78.0–100.0)
Monocytes Absolute: 0.4 10*3/uL (ref 0.1–1.0)
Monocytes Relative: 6 %
NEUTROS ABS: 5.7 10*3/uL (ref 1.7–7.7)
NEUTROS PCT: 73 %
Platelets: 351 10*3/uL (ref 150–400)
RBC: 4.27 MIL/uL (ref 3.87–5.11)
RDW: 12.6 % (ref 11.5–15.5)
WBC: 7.9 10*3/uL (ref 4.0–10.5)

## 2017-08-19 LAB — GLUCOSE, CAPILLARY: GLUCOSE-CAPILLARY: 145 mg/dL — AB (ref 70–99)

## 2017-08-19 MED ORDER — FAMOTIDINE 20 MG PO TABS
10.0000 mg | ORAL_TABLET | Freq: Once | ORAL | Status: AC
  Administered 2017-08-19: 10 mg via ORAL
  Filled 2017-08-19: qty 1

## 2017-08-19 MED ORDER — LABETALOL HCL 100 MG PO TABS: 200.0000 mg | ORAL_TABLET | Freq: Two times a day (BID) | ORAL | 3 refills | Status: DC

## 2017-08-19 MED ORDER — PROMETHAZINE HCL 25 MG PO TABS
25.0000 mg | ORAL_TABLET | Freq: Once | ORAL | Status: AC
  Administered 2017-08-19: 25 mg via ORAL
  Filled 2017-08-19: qty 1

## 2017-08-19 MED ORDER — PROMETHAZINE HCL 25 MG PO TABS: 25.0000 mg | ORAL_TABLET | Freq: Four times a day (QID) | ORAL | 0 refills | Status: DC | PRN

## 2017-08-19 MED ORDER — ONDANSETRON HCL 4 MG/2ML IJ SOLN
4.0000 mg | Freq: Once | INTRAMUSCULAR | Status: AC
  Administered 2017-08-19: 4 mg via INTRAVENOUS
  Filled 2017-08-19: qty 2

## 2017-08-19 MED ORDER — SODIUM CHLORIDE 0.9 % IV BOLUS
1000.0000 mL | Freq: Once | INTRAVENOUS | Status: AC
  Administered 2017-08-19: 1000 mL via INTRAVENOUS

## 2017-08-19 NOTE — Patient Instructions (Signed)
Test blood sugars before meals and at bedtime. Put those readings into the pump to make corrections to bolus amounts.   Call if questions.

## 2017-08-19 NOTE — MAU Provider Note (Addendum)
History     CSN: [id number]  Arrival date and time: 08/19/17 1649   First Provider Initiated Contact with Patient 08/19/17 1836      Chief Complaint  Patient presents with  . Emesis   Barbara Donovan is a 27 y.o G1P0 at [redacted]w[redacted]d who presents today with nausea and vomiting. She states that this has been an ongoing issue, but in the last few days it has worsened. She reports that she has been unable to keep anything down for about 2 days. She reports that her blood sugars have been consistently 140s despite not eating.   Emesis   This is a new problem. The current episode started yesterday. The problem occurs more than 10 times per day. The problem has been gradually worsening. The emesis has an appearance of bile. There has been no fever. Pertinent negatives include no chills or fever. Risk factors: pregnancy. She has tried nothing for the symptoms.    OB History    Gravida  1   Para      Term      Preterm      AB      Living        SAB      TAB      Ectopic      Multiple      Live Births              Past Medical History:  Diagnosis Date  . Diabetes ([place])    TYPE I  . Hypertension complicating pregnancy     Past Surgical History:  Procedure Laterality Date  . WISDOM TOOTH EXTRACTION      Family History  Problem Relation Age of Onset  . Diabetes Mother   . Diabetes Father   . Cataracts Father   . Amblyopia Neg Hx   . Blindness Neg Hx   . Glaucoma Neg Hx   . Macular degeneration Neg Hx   . Retinal detachment Neg Hx   . Strabismus Neg Hx   . Retinitis pigmentosa Neg Hx     Social History   Tobacco Use  . Smoking status: Never Smoker  . Smokeless tobacco: Never Used  Substance Use Topics  . Alcohol use: Not Currently  . Drug use: Never    Allergies: No Known Allergies  Medications Prior to Admission  Medication Sig Dispense Refill Last Dose  . aspirin 81 MG chewable tablet Chew 81 mg by mouth daily.    Taking  . Doxylamine-Pyridoxine  10-10 MG TBEC 2 tablets on an empty stomach at bedtime on day 1 and 2. If symptoms persist, take 1 tablet in the morning and 2 tablets at bedtime on day 3. If symptoms persist, increase to 1 tablet in the morning, 1 tablet in the afternoon, and 2 tablets at bedtime daily. 60 tablet 0 Taking  . glucose blood (ONETOUCH VERIO) test strip Used to check blood sugars five times daily. 150 each 12 Taking  . insulin aspart (NOVOLOG FLEXPEN) 100 UNIT/ML FlexPen Inject 7 units into the skin three times daily with meals. 15 mL 0 Taking  . Insulin Glargine (BASAGLAR KWIKPEN) 100 UNIT/ML SOPN Inject 0.28 mLs (28 Units total) into the skin at bedtime. 5 pen 0 Taking  . Lancets (ONETOUCH ULTRASOFT) lancets Used to check blood sugars five times daily. 200 each 12 Taking  . ondansetron (ZOFRAN ODT) 4 MG disintegrating tablet Take 1 tablet (4 mg total) by mouth every 8 (eight) hours as needed for nausea or  vomiting. 15 tablet 0 Taking  . Prenatal Vit-Fe Fumarate-FA (PRENATAL MULTIVITAMIN) TABS tablet Take 1 tablet by mouth daily at 12 noon.   Taking  . promethazine (PHENERGAN) 25 MG suppository Place 1 suppository (25 mg total) rectally every 6 (six) hours as needed for nausea or vomiting. 12 each 0 Taking  . sertraline (ZOLOFT) 50 MG tablet Take by mouth.   Not Taking    Review of Systems  Constitutional: Negative for chills and fever.  Gastrointestinal: Positive for constipation and vomiting.  Genitourinary: Negative for vaginal bleeding and vaginal discharge.  All other systems reviewed and are negative.  Physical Exam   Blood pressure (!) 146/81, pulse 94, temperature 99.5 F (37.5 C), resp. rate 18, height 5\' 6"  (1.676 m), weight 170 lb (77.1 kg), last menstrual period 04/02/2017.  Physical Exam  Nursing note and vitals reviewed. Constitutional: She is oriented to person, place, and time. She appears well-developed and well-nourished. No distress.  HENT:  Head: Normocephalic.  Cardiovascular: Normal  rate.  Respiratory: Effort normal.  GI: Soft. There is no tenderness. There is no rebound.  Neurological: She is alert and oriented to person, place, and time.  Skin: Skin is warm and dry.  Psychiatric: She has a normal mood and affect.    MAU Course  Procedures  MDM CBC, CMET, UA pending Patient has had 1L NS bolus and 4mg  Zofran IV  7:55 PM care turned over to [facility], CNM Marcille Buffy 7:55 PM 08/19/17   Patient Vitals for the past 24 hrs:  BP Temp Pulse Resp Height Weight  08/19/17 2023 (!) 165/81 - 87 - - -  08/19/17 1931 (!) 168/93 - 84 - - -  08/19/17 1916 (!) 164/87 - 83 - - -  08/19/17 1900 (!) 146/81 - 94 - - -  08/19/17 1758 (!) 170/93 99.5 F (37.5 C) 99 18 5\' 6"  (1.676 m) 170 lb (77.1 kg)    Orders Placed This Encounter  Procedures  . Urinalysis, Routine w reflex microscopic  . Glucose, capillary  . CBC with Differential/Platelet  . Comprehensive metabolic panel  . Discontinue IV  . Discharge patient   Results for orders placed or performed during the hospital encounter of 08/19/17 (from the past 24 hour(s))  Glucose, capillary     Status: Abnormal   Collection Time: 08/19/17  6:10 PM  Result Value Ref Range   Glucose-Capillary 145 (H) 70 - 99 mg/dL  CBC with Differential/Platelet     Status: None   Collection Time: 08/19/17  7:07 PM  Result Value Ref Range   WBC 7.9 4.0 - 10.5 K/uL   RBC 4.27 3.87 - 5.11 MIL/uL   Hemoglobin 12.6 12.0 - 15.0 g/dL   HCT 36.2 36.0 - 46.0 %   MCV 84.8 78.0 - 100.0 fL   MCH 29.5 26.0 - 34.0 pg   MCHC 34.8 30.0 - 36.0 g/dL   RDW 12.6 11.5 - 15.5 %   Platelets 351 150 - 400 K/uL   Neutrophils Relative % 73 %   Neutro Abs 5.7 1.7 - 7.7 K/uL   Lymphocytes Relative 21 %   Lymphs Abs 1.7 0.7 - 4.0 K/uL   Monocytes Relative 6 %   Monocytes Absolute 0.4 0.1 - 1.0 K/uL   Eosinophils Relative 0 %   Eosinophils Absolute 0.0 0.0 - 0.7 K/uL   Basophils Relative 0 %   Basophils Absolute 0.0 0.0 - 0.1 K/uL  Comprehensive  metabolic panel     Status: Abnormal  Collection Time: 08/19/17  7:07 PM  Result Value Ref Range   Sodium 131 (L) 135 - 145 mmol/L   Potassium 3.8 3.5 - 5.1 mmol/L   Chloride 98 98 - 111 mmol/L   CO2 23 22 - 32 mmol/L   Glucose, Bld 134 (H) 70 - 99 mg/dL   BUN 10 6 - 20 mg/dL   Creatinine, Ser 0.47 0.44 - 1.00 mg/dL   Calcium 8.7 (L) 8.9 - 10.3 mg/dL   Total Protein 6.1 (L) 6.5 - 8.1 g/dL   Albumin 2.4 (L) 3.5 - 5.0 g/dL   AST 20 15 - 41 U/L   ALT 22 0 - 44 U/L   Alkaline Phosphatase 39 38 - 126 U/L   Total Bilirubin 0.4 0.3 - 1.2 mg/dL   GFR calc non Af Amer >60 >60 mL/min   GFR calc Af Amer >60 >60 mL/min   Anion gap 10 5 - 15   Meds ordered this encounter  Medications  . sodium chloride 0.9 % bolus 1,000 mL  . ondansetron (ZOFRAN) injection 4 mg  . labetalol (NORMODYNE) 100 MG tablet    Sig: Take 2 tablets (200 mg total) by mouth 2 (two) times daily.    Dispense:  60 tablet    Refill:  3    Order Specific Question:   Supervising Provider    Answer:   [name] [[id number]]  . promethazine (PHENERGAN) 25 MG tablet    Sig: Take 1 tablet (25 mg total) by mouth every 6 (six) hours as needed for nausea or vomiting.    Dispense:  30 tablet    Refill:  0    Order Specific Question:   Supervising Provider    Answer:   [name] [[id number]]    Assessment and Plan  --27 y.o. G1P0 at [redacted]w[redacted]d with FHT 177 --No evidence of DKA --Discussed revised nutrition while managing n/v --Order placed for PO Phenergan, discussed starting at night to assess for sleepyness --Chronic hypertension, rx for Labetalol sent to pharmacy of record --Discharge home in stable condition  Patient to schedule BP check with prenatal care provider for Monday morning (08/22/2017)  Mallie Snooks, CNM 08/19/17  9:27 PM

## 2017-08-19 NOTE — Discharge Instructions (Signed)
Promethazine tablets What is this medicine? PROMETHAZINE (proe METH a zeen) is an antihistamine. It is used to treat allergic reactions and to treat or prevent nausea and vomiting from illness or motion sickness. It is also used to make you sleep before surgery, and to help treat pain or nausea after surgery. This medicine may be used for other purposes; ask your health care provider or pharmacist if you have questions. COMMON BRAND NAME(S): Phenergan What should I tell my health care provider before I take this medicine? They need to know if you have any of these conditions: -glaucoma -high blood pressure or heart disease -kidney disease -liver disease -lung or breathing disease, like asthma -prostate trouble -pain or difficulty passing urine -seizures -an unusual or allergic reaction to promethazine or phenothiazines, other medicines, foods, dyes, or preservatives -pregnant or trying to get pregnant -breast-feeding How should I use this medicine? Take this medicine by mouth with a glass of water. Follow the directions on the prescription label. Take your doses at regular intervals. Do not take your medicine more often than directed. Talk to your pediatrician regarding the use of this medicine in children. Special care may be needed. This medicine should not be given to infants and children younger than 2 years old. Overdosage: If you think you have taken too much of this medicine contact a poison control center or emergency room at once. NOTE: This medicine is only for you. Do not share this medicine with others. What if I miss a dose? If you miss a dose, take it as soon as you can. If it is almost time for your next dose, take only that dose. Do not take double or extra doses. What may interact with this medicine? Do not take this medicine with any of the following medications: -cisapride -dofetilide -dronedarone -MAOIs like Carbex, Eldepryl, Marplan, Nardil,  Parnate -pimozide -quinidine, including dextromethorphan; quinidine -thioridazine -ziprasidone This medicine may also interact with the following medications: -certain medicines for depression, anxiety, or psychotic disturbances -certain medicines for anxiety or sleep -certain medicines for seizures like carbamazepine, phenobarbital, phenytoin -certain medicines for movement abnormalities as in Parkinson's disease, or for gastrointestinal problems -epinephrine -medicines for allergies or colds -muscle relaxants -narcotic medicines for pain -other medicines that prolong the QT interval (cause an abnormal heart rhythm) -tramadol -trimethobenzamide This list may not describe all possible interactions. Give your health care provider a list of all the medicines, herbs, non-prescription drugs, or dietary supplements you use. Also tell them if you smoke, drink alcohol, or use illegal drugs. Some items may interact with your medicine. What should I watch for while using this medicine? Tell your doctor or health care professional if your symptoms do not start to get better in 1 to 2 days. You may get drowsy or dizzy. Do not drive, use machinery, or do anything that needs mental alertness until you know how this medicine affects you. To reduce the risk of dizzy or fainting spells, do not stand or sit up quickly, especially if you are an older patient. Alcohol may increase dizziness and drowsiness. Avoid alcoholic drinks. Your mouth may get dry. Chewing sugarless gum or sucking hard candy, and drinking plenty of water may help. Contact your doctor if the problem does not go away or is severe. This medicine may cause dry eyes and blurred vision. If you wear contact lenses you may feel some discomfort. Lubricating drops may help. See your eye doctor if the problem does not go away or is severe. This   medicine can make you more sensitive to the sun. Keep out of the sun. If you cannot avoid being in the sun,  wear protective clothing and use sunscreen. Do not use sun lamps or tanning beds/booths. If you are diabetic, check your blood-sugar levels regularly. What side effects may I notice from receiving this medicine? Side effects that you should report to your doctor or health care professional as soon as possible: -blurred vision -irregular heartbeat, palpitations or chest pain -muscle or facial twitches -pain or difficulty passing urine -seizures -skin rash -slowed or shallow breathing -unusual bleeding or bruising -yellowing of the eyes or skin Side effects that usually do not require medical attention (report to your doctor or health care professional if they continue or are bothersome): -headache -nightmares, agitation, nervousness, excitability, not able to sleep (these are more likely in children) -stuffy nose This list may not describe all possible side effects. Call your doctor for medical advice about side effects. You may report side effects to FDA at 1-800-FDA-1088. Where should I keep my medicine? Keep out of the reach of children. Store at room temperature, between 20 and 25 degrees C (68 and 77 degrees F). Protect from light. Throw away any unused medicine after the expiration date. NOTE: This sheet is a summary. It may not cover all possible information. If you have questions about this medicine, talk to your doctor, pharmacist, or health care provider.  2018 Elsevier/Gold Standard (2012-09-05 15:04:46)  

## 2017-08-19 NOTE — Progress Notes (Signed)
Patient brought in her pump with her visit with Dr. Loanne Drilling.  She says she is proficient in carb counting, and does not need a review of this.  She was encouraged to download the [employer] app. For help with this. We discussed how this pump delivers insulin and the the names for the 2 kinds of insulin deliveries.   Settings were put in by patient per Dr. Cordelia Pen orders: Basal rate: 0.7u/hr, ISF: 30,  I/C ratio: 10, target 100.   She was shown how to give a bolus and recdemonstrated this with no difficulty.  Discussed the importance of putting in blood sugar readings before each meal, to allow the pump to make corrections in the bolus amount.  She reported good understanding of this.  She filled a reservoir with Novolog insulin and started her pump at 4PM.  She did a correction bolus at this time of 1.2u of insulin.  We discussed the idea of IOB, and she reported good understanding of this with no final questions.  We also discussed the need to get blood sugar readings down.  Goals are FBSs less than 90 and 2hr. pc readings less than 140.  She reported good understanding of this with no final questions. We reviewed the checklist and she signed of as understanding all topics with no final questions. She was encouraged to go to [employer] and to set up and account and call us with user name and password.  She agreed to do this.

## 2017-08-19 NOTE — MAU Note (Signed)
Pt has had n/v for weeks given rx for zofran and dicleges but not working today.

## 2017-08-21 DIAGNOSIS — I10 Essential (primary) hypertension: Secondary | ICD-10-CM | POA: Insufficient documentation

## 2017-08-31 ENCOUNTER — Encounter (INDEPENDENT_AMBULATORY_CARE_PROVIDER_SITE_OTHER): Payer: BC Managed Care – PPO | Admitting: Ophthalmology

## 2017-09-01 ENCOUNTER — Ambulatory Visit: Payer: BC Managed Care – PPO | Admitting: Endocrinology

## 2017-09-09 ENCOUNTER — Other Ambulatory Visit: Payer: Self-pay | Admitting: Endocrinology

## 2017-09-12 ENCOUNTER — Encounter (HOSPITAL_BASED_OUTPATIENT_CLINIC_OR_DEPARTMENT_OTHER): Payer: Self-pay | Admitting: *Deleted

## 2017-09-12 ENCOUNTER — Emergency Department (HOSPITAL_BASED_OUTPATIENT_CLINIC_OR_DEPARTMENT_OTHER)
Admission: EM | Admit: 2017-09-12 | Discharge: 2017-09-12 | Disposition: A | Discharge status: Home / Self Care | Payer: BC Managed Care – PPO | Attending: Emergency Medicine | Admitting: Emergency Medicine

## 2017-09-12 ENCOUNTER — Other Ambulatory Visit: Payer: Self-pay

## 2017-09-12 DIAGNOSIS — Z79899 Other long term (current) drug therapy: Secondary | ICD-10-CM | POA: Insufficient documentation

## 2017-09-12 DIAGNOSIS — O211 Hyperemesis gravidarum with metabolic disturbance: Secondary | ICD-10-CM

## 2017-09-12 DIAGNOSIS — Z794 Long term (current) use of insulin: Secondary | ICD-10-CM | POA: Diagnosis not present

## 2017-09-12 DIAGNOSIS — O24012 Pre-existing diabetes mellitus, type 1, in pregnancy, second trimester: Secondary | ICD-10-CM | POA: Diagnosis not present

## 2017-09-12 DIAGNOSIS — E860 Dehydration: Secondary | ICD-10-CM | POA: Insufficient documentation

## 2017-09-12 DIAGNOSIS — Z7982 Long term (current) use of aspirin: Secondary | ICD-10-CM | POA: Diagnosis not present

## 2017-09-12 DIAGNOSIS — O10012 Pre-existing essential hypertension complicating pregnancy, second trimester: Secondary | ICD-10-CM | POA: Diagnosis not present

## 2017-09-12 DIAGNOSIS — E109 Type 1 diabetes mellitus without complications: Secondary | ICD-10-CM | POA: Insufficient documentation

## 2017-09-12 DIAGNOSIS — O162 Unspecified maternal hypertension, second trimester: Secondary | ICD-10-CM

## 2017-09-12 DIAGNOSIS — Z3A20 20 weeks gestation of pregnancy: Secondary | ICD-10-CM | POA: Diagnosis not present

## 2017-09-12 DIAGNOSIS — O219 Vomiting of pregnancy, unspecified: Secondary | ICD-10-CM | POA: Diagnosis present

## 2017-09-12 LAB — URINALYSIS, MICROSCOPIC (REFLEX)

## 2017-09-12 LAB — COMPREHENSIVE METABOLIC PANEL
ALK PHOS: 44 U/L (ref 38–126)
ALT: 17 U/L (ref 0–44)
AST: 19 U/L (ref 15–41)
Albumin: 2.5 g/dL — ABNORMAL LOW (ref 3.5–5.0)
Anion gap: 11 (ref 5–15)
BILIRUBIN TOTAL: 0.5 mg/dL (ref 0.3–1.2)
BUN: 9 mg/dL (ref 6–20)
CALCIUM: 8.4 mg/dL — AB (ref 8.9–10.3)
CO2: 24 mmol/L (ref 22–32)
Chloride: 97 mmol/L — ABNORMAL LOW (ref 98–111)
Creatinine, Ser: 0.59 mg/dL (ref 0.44–1.00)
GFR calc Af Amer: >60 mL/min (ref >60)
GFR calc non Af Amer: >60 mL/min (ref >60)
GLUCOSE: 161 mg/dL — AB (ref 70–99)
Potassium: 3.6 mmol/L (ref 3.5–5.1)
Sodium: 132 mmol/L — ABNORMAL LOW (ref 135–145)
TOTAL PROTEIN: 7.1 g/dL (ref 6.5–8.1)

## 2017-09-12 LAB — CBC
HEMATOCRIT: 37.3 % (ref 36.0–46.0)
HEMOGLOBIN: 12.8 g/dL (ref 12.0–15.0)
MCH: 28.8 pg (ref 26.0–34.0)
MCHC: 34.3 g/dL (ref 30.0–36.0)
MCV: 84 fL (ref 78.0–100.0)
Platelets: 404 10*3/uL — ABNORMAL HIGH (ref 150–400)
RBC: 4.44 MIL/uL (ref 3.87–5.11)
RDW: 12.4 % (ref 11.5–15.5)
WBC: 9.1 10*3/uL (ref 4.0–10.5)

## 2017-09-12 LAB — CREATININE, URINE, RANDOM: Creatinine, Urine: 206.22 mg/dL

## 2017-09-12 LAB — URINALYSIS, ROUTINE W REFLEX MICROSCOPIC
BILIRUBIN URINE: NEGATIVE
KETONES UR: 40 mg/dL — AB
LEUKOCYTES UA: NEGATIVE
Nitrite: NEGATIVE
Specific Gravity, Urine: 1.025 (ref 1.005–1.030)
pH: 6 (ref 5.0–8.0)

## 2017-09-12 LAB — CBG MONITORING, ED: Glucose-Capillary: 151 mg/dL — ABNORMAL HIGH (ref 70–99)

## 2017-09-12 LAB — I-STAT CG4 LACTIC ACID, ED: Lactic Acid, Venous: 1.24 mmol/L (ref 0.5–1.9)

## 2017-09-12 MED ORDER — SODIUM CHLORIDE 0.9 % IV BOLUS
1000.0000 mL | Freq: Once | INTRAVENOUS | Status: AC
  Administered 2017-09-12: 1000 mL via INTRAVENOUS

## 2017-09-12 MED ORDER — ONDANSETRON HCL 4 MG/2ML IJ SOLN
4.0000 mg | Freq: Once | INTRAMUSCULAR | Status: AC
  Administered 2017-09-12: 4 mg via INTRAVENOUS
  Filled 2017-09-12: qty 2

## 2017-09-12 MED ORDER — METOCLOPRAMIDE HCL 10 MG PO TABS: 10.0000 mg | ORAL_TABLET | Freq: Four times a day (QID) | ORAL | 0 refills | Status: DC

## 2017-09-12 MED ORDER — METOCLOPRAMIDE HCL 5 MG/ML IJ SOLN
10.0000 mg | Freq: Once | INTRAMUSCULAR | Status: AC
  Administered 2017-09-12: 10 mg via INTRAVENOUS
  Filled 2017-09-12: qty 2

## 2017-09-12 NOTE — Discharge Instructions (Signed)
Thank you for allowing me to care for you today in the Emergency Department.   Please call your OB/GYN's office tomorrow morning to update them on your care.  They will most likely want to see you before your next scheduled appointments.  Take 1 tablet of Reglan every 6 hours as needed for nausea or vomiting.  Continue to try to drink plenty of fluids.  If you were having vomiting try to drink Gatorade or Pedialyte since these have electrolytes.  Keep a close eye on your blood pressure and your blood sugar at home.  If your blood pressure goes up, if your blood sugar goes up, if you have vomiting despite taking Reglan, or if you develop other new symptoms, return to the emergency department for reevaluation.  If you do need to return to the emergency department, I would highly recommend letting her OB/GYN know about your symptoms as they may want you to go to [facility] or go to [facility] or [facility] as these hospitals have a way to admit you directly to the facility as we discussed.

## 2017-09-12 NOTE — ED Triage Notes (Signed)
Vomiting since yesterday. She is [redacted] weeks pregnant. States she has had hyperemesis throughout the pregnancy.

## 2017-09-12 NOTE — ED Provider Notes (Signed)
[facility] EMERGENCY DEPARTMENT Provider Note   CSN: [id number] Arrival date & time: 09/12/17  1342     History   Chief Complaint Chief Complaint  Patient presents with  . Emesis During Pregnancy    HPI Barbara Donovan is a G61P0  27 y.o. female with a history of type 1 diabetes mellitus, hyperemesis gravidarum, and hypertension who presents to the emergency department with a chief complaint of nausea and vomiting.  The patient endorses sudden onset nausea and vomiting that began approximately 2 PM yesterday afternoon.  She reports countless episodes of nonbloody, nonbilious emesis since onset.  She reports she took Zofran yesterday for her symptoms and promethazine this morning, but is continued to have vomiting.  She endorses mild generalized abdominal discomfort that is worse with vomiting.  No other known aggravating or relieving factors.  She is actively retching and vomiting in the emergency department.  She reports associated chills that began yesterday, but denies fever.  She reports that she had one episode of right-sided chest pain that lasted for approximately 1 minute that she characterizes squeezing.  She reports that she has had multiple episodes of chest pain that felt similar nature throughout her pregnancy.  This is since resolved.  She denies dyspnea, palpitations, leg swelling.  OBGYN is Dr. Abram Sander with [facility] estimated date of delivery is 01/24/2018.  Estimated gestational age is 40 weeks and 6 days.  Next follow-up appointment is September 26, 2017.  She is a high risk pregnancy.  She reports that her baseline blood pressure has been in the 938H to 829H systolically since the first trimester.  The history is provided by the patient. No language interpreter was used.    Past Medical History:  Diagnosis Date  . Diabetes ([place])    TYPE I  . Hypertension complicating pregnancy     Patient Active Problem List   Diagnosis Date Noted  . Depression during  pregnancy, antepartum 07/07/2017  . Supervision of high risk pregnancy, antepartum 07/07/2017  . Type 1 diabetes mellitus affecting pregnancy, antepartum 07/07/2017  . Chronic hypertension affecting pregnancy 07/07/2017  . Diabetes ([place]) 06/09/2017    Past Surgical History:  Procedure Laterality Date  . WISDOM TOOTH EXTRACTION       OB History    Gravida  1   Para      Term      Preterm      AB      Living        SAB      TAB      Ectopic      Multiple      Live Births               Home Medications    Prior to Admission medications   Medication Sig Start Date End Date Taking? Authorizing Provider  aspirin 81 MG chewable tablet Chew 81 mg by mouth daily.     [provider]  Blood Glucose Monitoring Suppl (ONETOUCH VERIO) w/Device KIT 1 DEVICE BY DOES NOT APPLY ROUTE DAILY. 09/10/17   Renato Shin, MD  Doxylamine-Pyridoxine 10-10 MG TBEC 2 tablets on an empty stomach at bedtime on day 1 and 2. If symptoms persist, take 1 tablet in the morning and 2 tablets at bedtime on day 3. If symptoms persist, increase to 1 tablet in the morning, 1 tablet in the afternoon, and 2 tablets at bedtime daily. 07/17/17   Street, [place], PA-C  glucose blood (ONETOUCH VERIO) test strip  Used to check blood sugars five times daily. 06/09/17   Renato Shin, MD  insulin aspart (NOVOLOG FLEXPEN) 100 UNIT/ML FlexPen Inject 7 units into the skin three times daily with meals. 08/05/17   Renato Shin, MD  Insulin Glargine (BASAGLAR KWIKPEN) 100 UNIT/ML SOPN Inject 0.28 mLs (28 Units total) into the skin at bedtime. 08/05/17   Renato Shin, MD  labetalol (NORMODYNE) 100 MG tablet Take 2 tablets (200 mg total) by mouth 2 (two) times daily. 08/19/17   Darlina Rumpf, CNM  Lancets [facility] ULTRASOFT) lancets Used to check blood sugars five times daily. 06/09/17   Renato Shin, MD  metoCLOPramide (REGLAN) 10 MG tablet Take 1 tablet (10 mg total) by mouth every 6 (six) hours. 09/12/17    Shailen Thielen A, PA-C  ondansetron (ZOFRAN ODT) 4 MG disintegrating tablet Take 1 tablet (4 mg total) by mouth every 8 (eight) hours as needed for nausea or vomiting. 07/17/17   Street, [place], PA-C  Prenatal Vit-Fe Fumarate-FA (PRENATAL MULTIVITAMIN) TABS tablet Take 1 tablet by mouth daily at 12 noon.    [provider]  promethazine (PHENERGAN) 25 MG tablet Take 1 tablet (25 mg total) by mouth every 6 (six) hours as needed for nausea or vomiting. 08/19/17   Darlina Rumpf, CNM  sertraline (ZOLOFT) 50 MG tablet Take by mouth. 07/07/17   [provider]    Family History Family History  Problem Relation Age of Onset  . Diabetes Mother   . Diabetes Father   . Cataracts Father   . Amblyopia Neg Hx   . Blindness Neg Hx   . Glaucoma Neg Hx   . Macular degeneration Neg Hx   . Retinal detachment Neg Hx   . Strabismus Neg Hx   . Retinitis pigmentosa Neg Hx     Social History Social History   Tobacco Use  . Smoking status: Never Smoker  . Smokeless tobacco: Never Used  Substance Use Topics  . Alcohol use: Not Currently  . Drug use: Never     Allergies   Patient has no known allergies.   Review of Systems Review of Systems  Constitutional: Negative for activity change, chills and fever.  Respiratory: Negative for shortness of breath.   Cardiovascular: Positive for chest pain.  Gastrointestinal: Positive for abdominal pain, nausea and vomiting. Negative for constipation and diarrhea.  Genitourinary: Negative for dysuria, vaginal bleeding and vaginal discharge.  Musculoskeletal: Negative for back pain.  Skin: Negative for rash.  Allergic/Immunologic: Negative for immunocompromised state.  Neurological: Negative for headaches.  Psychiatric/Behavioral: Negative for confusion.     Physical Exam Updated Vital Signs BP (!) 164/93 (BP Location: Left Arm)   Pulse 81   Temp 98.2 F (36.8 C) (Oral)   Resp 17   Ht 5' 6"  (1.676 m)   Wt 77.1 kg   LMP  04/02/2017 (Approximate)   SpO2 100%   BMI 27.44 kg/m   Physical Exam  Constitutional: No distress.  Ill appearing. Actively vomiting and retching.   HENT:  Head: Normocephalic.  Mucous membranes are dry.  Eyes: Conjunctivae are normal.  Neck: Neck supple.  Cardiovascular: Normal rate, regular rhythm, normal heart sounds and intact distal pulses. Exam reveals no gallop and no friction rub.  No murmur heard. Pulmonary/Chest: Effort normal. No stridor. No respiratory distress. She has no wheezes. She has no rales. She exhibits no tenderness.  Abdominal: Soft. She exhibits no distension and no mass. There is tenderness. There is no rebound and no guarding. No  hernia.  Minimally tender to palpation diffusely throughout the abdomen.  No focal right upper quadrant tenderness.  No movement of Murphy sign.  No CVA tenderness bilaterally.  Neurological: She is alert.  Skin: Skin is warm. No rash noted.  Psychiatric: Her behavior is normal.  Nursing note and vitals reviewed.    ED Treatments / Results  Labs (all labs ordered are listed, but only abnormal results are displayed) Labs Reviewed  URINALYSIS, ROUTINE W REFLEX MICROSCOPIC - Abnormal; Notable for the following components:      Result Value   Glucose, UA >=500 (*)    Hgb urine dipstick MODERATE (*)    Ketones, ur 40 (*)    Protein, ur >300 (*)    All other components within normal limits  URINALYSIS, MICROSCOPIC (REFLEX) - Abnormal; Notable for the following components:   Bacteria, UA FEW (*)    All other components within normal limits  CBC - Abnormal; Notable for the following components:   Platelets 404 (*)    All other components within normal limits  COMPREHENSIVE METABOLIC PANEL - Abnormal; Notable for the following components:   Sodium 132 (*)    Chloride 97 (*)    Glucose, Bld 161 (*)    Calcium 8.4 (*)    Albumin 2.5 (*)    All other components within normal limits  CBG MONITORING, ED - Abnormal; Notable for  the following components:   Glucose-Capillary 151 (*)    All other components within normal limits  CREATININE, URINE, RANDOM  I-STAT CG4 LACTIC ACID, ED  I-STAT CG4 LACTIC ACID, ED    EKG None  Radiology No results found.  Procedures Procedures (including critical care time)  Medications Ordered in ED Medications  ondansetron (ZOFRAN) injection 4 mg (4 mg Intravenous Given 09/12/17 1602)  sodium chloride 0.9 % bolus 1,000 mL ( Intravenous Stopped 09/12/17 1814)  metoCLOPramide (REGLAN) injection 10 mg (10 mg Intravenous Given 09/12/17 1725)  sodium chloride 0.9 % bolus 1,000 mL (0 mLs Intravenous Stopped 09/12/17 1922)     Initial Impression / Assessment and Plan / ED Course  I have reviewed the triage vital signs and the nursing notes.  Pertinent labs & imaging results that were available during my care of the patient were reviewed by me and considered in my medical decision making (see chart for details).  27 year old G1, P0 female with a history of type 1 diabetes mellitus, hyperemesis gravidarum, and hypertension presenting with nausea, vomiting since yesterday.  She is a high risk pregnancy who was experienced hypertension throughout her pregnancy and severe hyperemesis gravidarum.  She took Zofran at home without improvement.  On exam, the patient is ill-appearing.  Mucous membranes appear dry.  Abdomen is benign without focal right upper quadrant tenderness.  UA with 3+ proteinuria and greater than 500 glucose urea.  LFTs are normal.  Glucose is 161, but anion gap is normal.  Lites are mildly elevated at 404.  Labs are otherwise reassuring.  Patient is afebrile.  No tachycardia.  Blood pressure has been in the 841Y systolically, but did worsen to the 170s when she was actively vomiting.  Patient was seen and evaluated along with Dr. Rogene Houston, attending physician.  Clinical Course as of Sep 13 2047  Mon Sep 12, 2017  1759 Spoke with Dr. Lebron Conners with [facility].  The  patient's blood pressure has been around 606 systolic, but did worsen to 160s and 170s when she was actively vomiting.  Manual blood pressure just repeated  was 146/86. Dr. Lebron Conners reviewed the patient's blood pressure trends over the course of her pregnancy and currently feels that they are at her baseline.  Discussed with her that she had 3+ proteinuria and a UA performed on 07/17/2017 in the ED.  No UA on file with [facility], but she felt this was reassuring.  Her recommendation was to perform a UA creatinine level if available, cycle the patient's blood pressure, and get her current symptoms under control.   [MM]    Clinical Course User Index [MM] [place] Dodgen A, PA-C   Doubt DKA as the patient's glucose is mildly elevated, but anion gap is normal.  Doubt preeclampsia, eclampsia, or help as the patient's blood pressure has remained at her baseline and LFTs are normal.  The patient also had 3+ proteinuria at an ED visit in June 30, which is reassuring that this may be her baseline.  Dr. Lebron Conners agreed.  The patient has been observed for 6 hours in the emergency department.  No retching or vomiting since she was given Reglan.  She has had two 1 L IV fluid boluses.  She has been successfully fluid challenge.  She is feeling much better.  Her blood pressure has continued to stay at her baseline.  Urine creatinine is pending at this time as it is a send out.  Discussed with the patient that she should call her OB/GYN tomorrow as they would probably like to see her in the office in the next day or 2.  We will discharge her with oral Reglan given the significant improvement in her symptoms today.  She appears much improved since arrival in the ED.  She was given strict return precautions to the emergency department if her symptoms persisted or worsened.  She is hemodynamically stable and in no acute distress at this time.  She is safe for discharge home with outpatient follow-up.  Final Clinical Impressions(s)  / ED Diagnoses   Final diagnoses:  Hyperemesis gravidarum before end of [redacted] week gestation with dehydration  Hypertension affecting pregnancy in second trimester    ED Discharge Orders         Ordered    metoCLOPramide (REGLAN) 10 MG tablet  Every 6 hours     09/12/17 2030           Joline Maxcy A, PA-C 09/12/17 2050    Fredia Sorrow, MD 09/12/17 2348

## 2017-09-30 DIAGNOSIS — O358XX0 Maternal care for other (suspected) fetal abnormality and damage, not applicable or unspecified: Secondary | ICD-10-CM | POA: Insufficient documentation

## 2017-09-30 DIAGNOSIS — O35BXX0 Maternal care for other (suspected) fetal abnormality and damage, fetal cardiac anomalies, not applicable or unspecified: Secondary | ICD-10-CM | POA: Insufficient documentation

## 2017-09-30 HISTORY — DX: Maternal care for other (suspected) fetal abnormality and damage, not applicable or unspecified: O35.8XX0

## 2017-09-30 HISTORY — DX: Maternal care for other (suspected) fetal abnormality and damage, fetal cardiac anomalies, not applicable or unspecified: O35.BXX0

## 2017-10-16 ENCOUNTER — Inpatient Hospital Stay (HOSPITAL_COMMUNITY)
Admission: AD | Admit: 2017-10-16 | Discharge: 2017-10-16 | Disposition: A | Discharge status: Home / Self Care | Payer: BC Managed Care – PPO | Source: Ambulatory Visit | Attending: Obstetrics & Gynecology | Admitting: Obstetrics & Gynecology

## 2017-10-16 ENCOUNTER — Encounter (HOSPITAL_COMMUNITY): Payer: Self-pay | Admitting: *Deleted

## 2017-10-16 DIAGNOSIS — Z794 Long term (current) use of insulin: Secondary | ICD-10-CM | POA: Insufficient documentation

## 2017-10-16 DIAGNOSIS — E109 Type 1 diabetes mellitus without complications: Secondary | ICD-10-CM | POA: Diagnosis not present

## 2017-10-16 DIAGNOSIS — Z3A25 25 weeks gestation of pregnancy: Secondary | ICD-10-CM | POA: Insufficient documentation

## 2017-10-16 DIAGNOSIS — O10912 Unspecified pre-existing hypertension complicating pregnancy, second trimester: Secondary | ICD-10-CM

## 2017-10-16 DIAGNOSIS — Z7982 Long term (current) use of aspirin: Secondary | ICD-10-CM | POA: Diagnosis not present

## 2017-10-16 DIAGNOSIS — O211 Hyperemesis gravidarum with metabolic disturbance: Secondary | ICD-10-CM | POA: Insufficient documentation

## 2017-10-16 DIAGNOSIS — O24012 Pre-existing diabetes mellitus, type 1, in pregnancy, second trimester: Secondary | ICD-10-CM | POA: Insufficient documentation

## 2017-10-16 DIAGNOSIS — O212 Late vomiting of pregnancy: Secondary | ICD-10-CM | POA: Diagnosis not present

## 2017-10-16 DIAGNOSIS — O10012 Pre-existing essential hypertension complicating pregnancy, second trimester: Secondary | ICD-10-CM | POA: Insufficient documentation

## 2017-10-16 DIAGNOSIS — E860 Dehydration: Secondary | ICD-10-CM

## 2017-10-16 DIAGNOSIS — O210 Mild hyperemesis gravidarum: Secondary | ICD-10-CM

## 2017-10-16 DIAGNOSIS — O24319 Unspecified pre-existing diabetes mellitus in pregnancy, unspecified trimester: Secondary | ICD-10-CM

## 2017-10-16 LAB — COMPREHENSIVE METABOLIC PANEL
ALT: 15 U/L (ref 0–44)
ANION GAP: 11 (ref 5–15)
AST: 19 U/L (ref 15–41)
Albumin: 2.2 g/dL — ABNORMAL LOW (ref 3.5–5.0)
Alkaline Phosphatase: 47 U/L (ref 38–126)
BILIRUBIN TOTAL: 0.6 mg/dL (ref 0.3–1.2)
BUN: 6 mg/dL (ref 6–20)
CHLORIDE: 99 mmol/L (ref 98–111)
CO2: 22 mmol/L (ref 22–32)
Calcium: 8.6 mg/dL — ABNORMAL LOW (ref 8.9–10.3)
Creatinine, Ser: 0.65 mg/dL (ref 0.44–1.00)
GFR calc Af Amer: >60 mL/min (ref >60)
Glucose, Bld: 189 mg/dL — ABNORMAL HIGH (ref 70–99)
POTASSIUM: 3.8 mmol/L (ref 3.5–5.1)
Sodium: 132 mmol/L — ABNORMAL LOW (ref 135–145)
TOTAL PROTEIN: 6.4 g/dL — AB (ref 6.5–8.1)

## 2017-10-16 LAB — URINALYSIS, ROUTINE W REFLEX MICROSCOPIC
BILIRUBIN URINE: NEGATIVE
Ketones, ur: 80 mg/dL — AB
LEUKOCYTES UA: NEGATIVE
NITRITE: NEGATIVE
SPECIFIC GRAVITY, URINE: 1.021 (ref 1.005–1.030)
pH: 6 (ref 5.0–8.0)

## 2017-10-16 LAB — PROTEIN / CREATININE RATIO, URINE
CREATININE, URINE: 149 mg/dL
PROTEIN CREATININE RATIO: 10.23 mg/mg{creat} — AB (ref 0.00–0.15)
TOTAL PROTEIN, URINE: 1525 mg/dL

## 2017-10-16 LAB — CBC
HEMATOCRIT: 33.8 % — AB (ref 36.0–46.0)
Hemoglobin: 11.7 g/dL — ABNORMAL LOW (ref 12.0–15.0)
MCH: 29 pg (ref 26.0–34.0)
MCHC: 34.6 g/dL (ref 30.0–36.0)
MCV: 83.9 fL (ref 78.0–100.0)
PLATELETS: 427 10*3/uL — AB (ref 150–400)
RBC: 4.03 MIL/uL (ref 3.87–5.11)
RDW: 12.4 % (ref 11.5–15.5)
WBC: 6.6 10*3/uL (ref 4.0–10.5)

## 2017-10-16 LAB — GLUCOSE, CAPILLARY: Glucose-Capillary: 174 mg/dL — ABNORMAL HIGH (ref 70–99)

## 2017-10-16 MED ORDER — HYDRALAZINE HCL 20 MG/ML IJ SOLN: 10.0000 mg | INTRAMUSCULAR | Status: DC | PRN

## 2017-10-16 MED ORDER — SCOPOLAMINE 1 MG/3DAYS TD PT72
1.0000 | MEDICATED_PATCH | TRANSDERMAL | Status: DC
  Administered 2017-10-16: 1.5 mg via TRANSDERMAL
  Filled 2017-10-16: qty 1

## 2017-10-16 MED ORDER — FAMOTIDINE IN NACL 20-0.9 MG/50ML-% IV SOLN
20.0000 mg | Freq: Once | INTRAVENOUS | Status: AC
  Administered 2017-10-16: 20 mg via INTRAVENOUS
  Filled 2017-10-16: qty 50

## 2017-10-16 MED ORDER — PROCHLORPERAZINE MALEATE 10 MG PO TABS: 10.0000 mg | ORAL_TABLET | Freq: Four times a day (QID) | ORAL | 0 refills | Status: DC | PRN

## 2017-10-16 MED ORDER — PROMETHAZINE HCL 25 MG RE SUPP: 25.0000 mg | Freq: Four times a day (QID) | RECTAL | 0 refills | Status: DC | PRN

## 2017-10-16 MED ORDER — LABETALOL HCL 5 MG/ML IV SOLN
20.0000 mg | INTRAVENOUS | Status: DC | PRN
Start: 2017-10-16 — End: 2017-10-16
  Administered 2017-10-16: 20 mg via INTRAVENOUS
  Filled 2017-10-16: qty 4

## 2017-10-16 MED ORDER — PROCHLORPERAZINE EDISYLATE 10 MG/2ML IJ SOLN
10.0000 mg | Freq: Once | INTRAMUSCULAR | Status: AC
  Administered 2017-10-16: 10 mg via INTRAVENOUS
  Filled 2017-10-16: qty 2

## 2017-10-16 MED ORDER — LABETALOL HCL 5 MG/ML IV SOLN
40.0000 mg | INTRAVENOUS | Status: DC | PRN
  Filled 2017-10-16: qty 8

## 2017-10-16 MED ORDER — LABETALOL HCL 5 MG/ML IV SOLN: 80.0000 mg | INTRAVENOUS | Status: DC | PRN

## 2017-10-16 MED ORDER — LACTATED RINGERS IV BOLUS
1000.0000 mL | Freq: Once | INTRAVENOUS | Status: AC
  Administered 2017-10-16: 1000 mL via INTRAVENOUS

## 2017-10-16 NOTE — MAU Provider Note (Signed)
History     CSN: [id number]  Arrival date and time: 10/16/17 0831   G1 _0 .4 wks here with N/V. She'd been treated for HEG and was taking Reglan, Phenergan, Scopolamine, Unisom, and B6 until 2 days ago when she couldn't keep anything down. She is unable to tolerate liquids as well. She denies fever or diarrhea. She reports good FM. No VB, LOF, or ctx. Her pregnancy has been complicated by D1VO, CHTN, HEG, and fetal ASD. She gets care at [facility]. She has not taken Insulin in 2 days because she was afraid in becoming hypoglycemia. She also has not taken her Procardia or Labetalol.  OB History    Gravida  1   Para      Term      Preterm      AB      Living        SAB      TAB      Ectopic      Multiple      Live Births              Past Medical History:  Diagnosis Date  . Diabetes ([place])    TYPE I  . Hypertension complicating pregnancy     Past Surgical History:  Procedure Laterality Date  . WISDOM TOOTH EXTRACTION      Family History  Problem Relation Age of Onset  . Diabetes Mother   . Diabetes Father   . Cataracts Father   . Amblyopia Neg Hx   . Blindness Neg Hx   . Glaucoma Neg Hx   . Macular degeneration Neg Hx   . Retinal detachment Neg Hx   . Strabismus Neg Hx   . Retinitis pigmentosa Neg Hx     Social History   Tobacco Use  . Smoking status: Never Smoker  . Smokeless tobacco: Never Used  Substance Use Topics  . Alcohol use: Not Currently  . Drug use: Never    Allergies: No Known Allergies  No medications prior to admission.    Review of Systems  Constitutional: Negative for chills and fever.  Gastrointestinal: Positive for nausea and vomiting. Negative for abdominal pain, constipation and diarrhea.  Genitourinary: Negative for vaginal bleeding.   Physical Exam   Blood pressure (!) 159/87, pulse 67, temperature 98.3 F (36.8 C), temperature source Oral, resp. rate 19, weight 78.1 kg, last menstrual period 04/02/2017, SpO2 100  %.  Patient Vitals for the past 24 hrs:  BP Temp Temp src Pulse Resp SpO2 Weight  10/16/17 1254 - - - - 19 100 % -  10/16/17 1211 (!) 159/87 - - 67 - - -  10/16/17 1111 (!) 157/89 - - 91 - - -  10/16/17 1101 (!) 141/85 - - 80 - - -  10/16/17 1051 (!) 156/85 - - 75 - - -  10/16/17 1045 (!) 158/93 - - 94 - - -  10/16/17 1041 (!) 170/89 - - 83 - - -  10/16/17 1031 (!) 143/86 - - 84 - - -  10/16/17 1020 (!) 152/81 - - 75 - - -  10/16/17 1011 (!) 169/89 - - 70 - - -  10/16/17 1000 (!) 177/91 - - 77 - - -  10/16/17 0940 (!) 163/97 - - 96 - - -  10/16/17 0922 (!) 177/96 - - - - - -  10/16/17 0855 (!) 145/106 98.3 F (36.8 C) Oral (!) 102 20 100 % -  10/16/17 0844 - - - - - -  78.1 kg    Physical Exam  Constitutional: She is oriented to person, place, and time. She appears well-developed and well-nourished. No distress.  HENT:  Head: Normocephalic and atraumatic.  Neck: Normal range of motion.  Cardiovascular: Normal rate.  Respiratory: Effort normal. No respiratory distress.  Musculoskeletal: Normal range of motion.  Neurological: She is alert and oriented to person, place, and time.  Skin: Skin is warm and dry.  Psychiatric: She has a normal mood and affect.  EFM: 145 bpm, mod variability, + accels, occ variable decels [place]: irritability  Results for orders placed or performed during the hospital encounter of 10/16/17 (from the past 24 hour(s))  Urinalysis, Routine w reflex microscopic     Status: Abnormal   Collection Time: 10/16/17  9:03 AM  Result Value Ref Range   Color, Urine YELLOW YELLOW   APPearance CLOUDY (A) CLEAR   Specific Gravity, Urine 1.021 1.005 - 1.030   pH 6.0 5.0 - 8.0   Glucose, UA >=500 (A) NEGATIVE mg/dL   Hgb urine dipstick SMALL (A) NEGATIVE   Bilirubin Urine NEGATIVE NEGATIVE   Ketones, ur 80 (A) NEGATIVE mg/dL   Protein, ur >=300 (A) NEGATIVE mg/dL   Nitrite NEGATIVE NEGATIVE   Leukocytes, UA NEGATIVE NEGATIVE   RBC / HPF 6-10 0 - 5 RBC/hpf    WBC, UA 11-20 0 - 5 WBC/hpf   Bacteria, UA RARE (A) NONE SEEN   Squamous Epithelial / LPF 21-50 0 - 5   Mucus PRESENT   Protein / creatinine ratio, urine     Status: Abnormal   Collection Time: 10/16/17  9:03 AM  Result Value Ref Range   Creatinine, Urine 149.00 mg/dL   Total Protein, Urine 1,525 mg/dL   Protein Creatinine Ratio 10.23 (H) 0.00 - 0.15 mg/mg[Cre]  CBC     Status: Abnormal   Collection Time: 10/16/17  9:21 AM  Result Value Ref Range   WBC 6.6 4.0 - 10.5 K/uL   RBC 4.03 3.87 - 5.11 MIL/uL   Hemoglobin 11.7 (L) 12.0 - 15.0 g/dL   HCT 33.8 (L) 36.0 - 46.0 %   MCV 83.9 78.0 - 100.0 fL   MCH 29.0 26.0 - 34.0 pg   MCHC 34.6 30.0 - 36.0 g/dL   RDW 12.4 11.5 - 15.5 %   Platelets 427 (H) 150 - 400 K/uL  Comprehensive metabolic panel     Status: Abnormal   Collection Time: 10/16/17  9:21 AM  Result Value Ref Range   Sodium 132 (L) 135 - 145 mmol/L   Potassium 3.8 3.5 - 5.1 mmol/L   Chloride 99 98 - 111 mmol/L   CO2 22 22 - 32 mmol/L   Glucose, Bld 189 (H) 70 - 99 mg/dL   BUN 6 6 - 20 mg/dL   Creatinine, Ser 0.65 0.44 - 1.00 mg/dL   Calcium 8.6 (L) 8.9 - 10.3 mg/dL   Total Protein 6.4 (L) 6.5 - 8.1 g/dL   Albumin 2.2 (L) 3.5 - 5.0 g/dL   AST 19 15 - 41 U/L   ALT 15 0 - 44 U/L   Alkaline Phosphatase 47 38 - 126 U/L   Total Bilirubin 0.6 0.3 - 1.2 mg/dL   GFR calc non Af Amer >60 >60 mL/min   GFR calc Af Amer >60 >60 mL/min   Anion gap 11 5 - 15  Glucose, capillary     Status: Abnormal   Collection Time: 10/16/17  9:21 AM  Result Value Ref Range   Glucose-Capillary  174 (H) 70 - 99 mg/dL   MAU Course  Procedures LR Pepcid Compazine Scop patch Labetalol  MDM Labs ordered and reviewed. Severe range BP noted, HTN protocol started. BP improved after single dose of IV Labetalol. No evidence of PEC or DKA. Tolerating po after meds and fluids and feels better. No further emesis. Proteinuria noted on PCR (1023 mg), review of her records shows proteinuria since 2017  (PCR 685 mg). Consult with Dr. Elonda Husky, plan for 24 hr urine collection at home and f/u with her primary OB this week. I stressed the importance of taking her BP meds once she gets home. Stable for discharge home.    Assessment and Plan   1. [redacted] weeks gestation of pregnancy   2. Pre-existing insulin treated diabetes mellitus during pregnancy ([place])   3. Maternal chronic hypertension in second trimester   4. Hyperemesis of pregnancy   5. Dehydration    Discharge home [facility] this week Rx Compazine Rx Phenergan supp Continue Scopolamine Continue B6 and Unisom Continue Reglan prn  Allergies as of 10/16/2017   No Known Allergies     Medication List    STOP taking these medications   ondansetron 4 MG disintegrating tablet Commonly known as:  ZOFRAN-ODT   promethazine 25 MG tablet Commonly known as:  PHENERGAN Replaced by:  promethazine 25 MG suppository     TAKE these medications   aspirin 81 MG chewable tablet Chew 81 mg by mouth daily.   BASAGLAR KWIKPEN 100 UNIT/ML Sopn Inject 0.28 mLs (28 Units total) into the skin at bedtime.   Doxylamine-Pyridoxine 10-10 MG Tbec 2 tablets on an empty stomach at bedtime on day 1 and 2. If symptoms persist, take 1 tablet in the morning and 2 tablets at bedtime on day 3. If symptoms persist, increase to 1 tablet in the morning, 1 tablet in the afternoon, and 2 tablets at bedtime daily.   glucose blood test strip Used to check blood sugars five times daily.   insulin aspart 100 UNIT/ML FlexPen Commonly known as:  NOVOLOG Inject 7 units into the skin three times daily with meals.   labetalol 100 MG tablet Commonly known as:  NORMODYNE Take 2 tablets (200 mg total) by mouth 2 (two) times daily.   metoCLOPramide 10 MG tablet Commonly known as:  REGLAN Take 1 tablet (10 mg total) by mouth every 6 (six) hours.   onetouch ultrasoft lancets Used to check blood sugars five times daily.   ONETOUCH VERIO w/Device Kit 1  DEVICE BY DOES NOT APPLY ROUTE DAILY.   prenatal multivitamin Tabs tablet Take 1 tablet by mouth daily at 12 noon.   prochlorperazine 10 MG tablet Commonly known as:  COMPAZINE Take 1 tablet (10 mg total) by mouth every 6 (six) hours as needed for nausea or vomiting.   promethazine 25 MG suppository Commonly known as:  PHENERGAN Place 1 suppository (25 mg total) rectally every 6 (six) hours as needed for nausea. Replaces:  promethazine 25 MG tablet   sertraline 50 MG tablet Commonly known as:  ZOLOFT Take by mouth.      Julianne Handler, CNM 10/16/2017, 1:09 PM

## 2017-10-16 NOTE — Discharge Instructions (Signed)
Eating Plan for Hyperemesis Gravidarum Hyperemesis gravidarum is a severe form of morning sickness. Because this condition causes severe nausea and vomiting, it can lead to dehydration, malnutrition, and weight loss. One way to lessen the symptoms of nausea and vomiting is to follow the eating plan for hyperemesis gravidarum. It is often used along with prescribed medicines to control your symptoms. What can I do to relieve my symptoms? Listen to your body. Everyone is different and has different preferences. Find what works best for you. Take any of the following actions that are helpful to you:  Eat and drink slowly.  Eat 5-6 small meals daily instead of 3 large meals.  Eat crackers before you get out of bed in the morning.  Try having a snack in the middle of the night.  Starchy foods are usually tolerated well. Examples include cereal, toast, bread, potatoes, pasta, rice, and pretzels.  Ginger may help with nausea. Add  tsp ground ginger to hot tea or choose ginger tea.  Try drinking 100% fruit juice or an electrolyte drink. An electrolyte drink contains sodium, potassium, and chloride.  Continue to take your prenatal vitamins as told by your health care provider. If you are having trouble taking your prenatal vitamins, talk with your health care provider about different options.  Include at least 1 serving of protein with your meals and snacks. Protein options include meats or poultry, beans, nuts, eggs, and yogurt. Try eating a protein-rich snack before bed. Examples of these snacks include cheese and crackers or half of a peanut butter or [place] sandwich.  Consider eliminating foods that trigger your symptoms. These may include spicy foods, coffee, high-fat foods, very sweet foods, and acidic foods.  Try meals that have more protein combined with bland, salty, lower-fat, and dry foods, such as nuts, seeds, pretzels, crackers, and cereal.  Talk with your healthcare provider about  starting a supplement of vitamin B6.  Have fluids that are cold, clear, and carbonated or sour. Examples include lemonade, ginger ale, lemon-lime soda, ice water, and sparkling water.  Try lemon or mint tea.  Try brushing your teeth or using a mouth rinse after meals.  What should I avoid to reduce my symptoms? Avoiding some of the following things may help reduce your symptoms.  Foods with strong smells. Try eating meals in well-ventilated areas that are free of odors.  Drinking water or other beverages with meals. Try not to drink anything during the 30 minutes before and after your meals.  Drinking more than 1 cup of fluid at a time. Sometimes using a straw helps.  Fried or high-fat foods, such as butter and cream sauces.  Spicy foods.  Skipping meals as best as you can. Nausea can be more intense on an empty stomach. If you cannot tolerate food at that time, do not force it. Try sucking on ice chips or other frozen items, and make up for missed calories later.  Lying down within 2 hours after eating.  Environmental triggers. These may include smoky rooms, closed spaces, rooms with strong smells, warm or humid places, overly loud and noisy rooms, and rooms with motion or flickering lights.  Quick and sudden changes in your movement.  This information is not intended to replace advice given to you by your health care provider. Make sure you discuss any questions you have with your health care provider. Document Released: 11/01/2006 Document Revised: 09/03/2015 Document Reviewed: 08/05/2015 Elsevier Interactive Patient Education  2018 [employer].   Hypertension During  Pregnancy Hypertension is also called high blood pressure. High blood pressure means that the force of your blood moving in your body is too strong. When you are pregnant, this condition should be watched carefully. It can cause problems for you and your baby. Follow these instructions at home: Eating and  drinking Drink enough fluid to keep your pee (urine) clear or pale yellow. Eat healthy foods that are low in salt (sodium). Do not add salt to your food. Check labels on foods and drinks to see much salt is in them. Look on the label where you see "Sodium." Lifestyle Do not use any products that contain nicotine or tobacco, such as cigarettes and e-cigarettes. If you need help quitting, ask your doctor. Do not use alcohol. Avoid caffeine. Avoid stress. Rest and get plenty of sleep. General instructions Take over-the-counter and prescription medicines only as told by your doctor. While lying down, lie on your left side. This keeps pressure off your baby. While sitting or lying down, raise (elevate) your feet. Try putting some pillows under your lower legs. Exercise regularly. Ask your doctor what kinds of exercise are best for you. Keep all prenatal and follow-up visits as told by your doctor. This is important. Contact a doctor if: You have symptoms that your doctor told you to watch for, such as: Fever. Throwing up (vomiting). Headache. Get help right away if: You have very bad pain in your belly (abdomen). You are throwing up, and this does not get better with treatment. You suddenly get swelling in your hands, ankles, or face. You gain 4 lb (1.8 kg) or more in 1 week. You get bleeding from your vagina. You have blood in your pee. You do not feel your baby moving as much as normal. You have a change in vision. You have muscle twitching or sudden tightening (spasms). You have trouble breathing. Your lips or fingernails turn blue. This information is not intended to replace advice given to you by your health care provider. Make sure you discuss any questions you have with your health care provider. Document Released: 02/06/2010 Document Revised: 09/16/2015 Document Reviewed: 09/16/2015 Elsevier Interactive Patient Education  2018 [employer].   Hyperemesis  Gravidarum Hyperemesis gravidarum is a severe form of nausea and vomiting that happens during pregnancy. Hyperemesis is worse than morning sickness. It may cause you to have nausea or vomiting all day for many days. It may keep you from eating and drinking enough food and liquids. Hyperemesis usually occurs during the first half (the first 20 weeks) of pregnancy. It often goes away once a woman is in her second half of pregnancy. However, sometimes hyperemesis continues through an entire pregnancy. What are the causes? The cause of this condition is not known. It may be related to changes in chemicals (hormones) in the body during pregnancy, such as the high level of pregnancy hormone (human chorionic gonadotropin) or the increase in the female sex hormone (estrogen). What are the signs or symptoms? Symptoms of this condition include:  Severe nausea and vomiting.  Nausea that does not go away.  Vomiting that does not allow you to keep any food down.  Weight loss.  Body fluid loss (dehydration).  Having no desire to eat, or not liking food that you have previously enjoyed.  How is this diagnosed? This condition may be diagnosed based on:  A physical exam.  Your medical history.  Your symptoms.  Blood tests.  Urine tests.  How is this treated? This condition may  be managed with medicine. If medicines to do not help relieve nausea and vomiting, you may need to receive fluids through an IV tube at the hospital. Follow these instructions at home:  Take over-the-counter and prescription medicines only as told by your health care provider.  Avoid iron pills and multivitamins that contain iron for the first 3-4 months of pregnancy. If you take prescription iron pills, do not stop taking them unless your health care provider approves.  Take the following actions to help prevent nausea and vomiting: ? In the morning, before getting out of bed, try eating a couple of dry crackers or a  piece of toast. ? Avoid foods and smells that upset your stomach. Fatty and spicy foods may make nausea worse. ? Eat 5-6 small meals a day. ? Do not drink fluids while eating meals. Drink between meals. ? Eat or suck on things that have ginger in them. Ginger can help relieve nausea. ? Avoid food preparation. The smell of food can spoil your appetite or trigger nausea.  Follow instructions from your health care provider about eating or drinking restrictions.  For snacks, eat high-protein foods, such as cheese.  Keep all follow-up and pre-birth (prenatal) visits as told by your health care provider. This is important. Contact a health care provider if:  You have pain in your abdomen.  You have a severe headache.  You have vision problems.  You are losing weight. Get help right away if:  You cannot drink fluids without vomiting.  You vomit blood.  You have constant nausea and vomiting.  You are very weak.  You are very thirsty.  You feel dizzy.  You faint.  You have a fever or other symptoms that last for more than 2-3 days.  You have a fever and your symptoms suddenly get worse. Summary  Hyperemesis gravidarum is a severe form of nausea and vomiting that happens during pregnancy.  Making some changes to your eating habits may help relieve nausea and vomiting.  This condition may be managed with medicine.  If medicines to do not help relieve nausea and vomiting, you may need to receive fluids through an IV tube at the hospital. This information is not intended to replace advice given to you by your health care provider. Make sure you discuss any questions you have with your health care provider. Document Released: 01/04/2005 Document Revised: 09/03/2015 Document Reviewed: 09/03/2015 Elsevier Interactive Patient Education  2017 [employer].

## 2017-10-16 NOTE — MAU Note (Addendum)
Barbara Donovan is a 27 y.o. at [redacted]w[redacted]d here in MAU reporting: emesis (pt actively vomiting during triage) Onset of complaint: started Friday. Still using scopolamine patch. States zofran "does not do anything for me". Tried to do unisom yesterday but it came back up. Voices expressions of to many episodes of vomiting to count.  States came by herself and doesn't not have another ride home. Pain score: denies T1DM: Blood sugar this morning at home: states has not checked it Vitals:   10/16/17 0855  BP: (!) 145/106  Pulse: (!) 102  Resp: 20  Temp: 98.3 F (36.8 C)  SpO2: 100%   Has not taken her bp medication due to not being able to keep anything down. FHT:155 via external monitor Lab orders placed from triage: ua

## 2017-10-29 ENCOUNTER — Encounter (HOSPITAL_COMMUNITY): Payer: Self-pay

## 2017-10-29 ENCOUNTER — Observation Stay (HOSPITAL_COMMUNITY)
Admission: AD | Admit: 2017-10-29 | Discharge: 2017-10-31 | Disposition: A | Discharge status: Home / Self Care | Payer: BC Managed Care – PPO | Source: Ambulatory Visit | Attending: Family Medicine | Admitting: Family Medicine

## 2017-10-29 DIAGNOSIS — E872 Acidosis: Secondary | ICD-10-CM | POA: Diagnosis not present

## 2017-10-29 DIAGNOSIS — O359XX0 Maternal care for (suspected) fetal abnormality and damage, unspecified, not applicable or unspecified: Secondary | ICD-10-CM | POA: Diagnosis not present

## 2017-10-29 DIAGNOSIS — O10919 Unspecified pre-existing hypertension complicating pregnancy, unspecified trimester: Secondary | ICD-10-CM

## 2017-10-29 DIAGNOSIS — O210 Mild hyperemesis gravidarum: Secondary | ICD-10-CM

## 2017-10-29 DIAGNOSIS — Z3A27 27 weeks gestation of pregnancy: Secondary | ICD-10-CM | POA: Diagnosis not present

## 2017-10-29 DIAGNOSIS — O133 Gestational [pregnancy-induced] hypertension without significant proteinuria, third trimester: Secondary | ICD-10-CM | POA: Insufficient documentation

## 2017-10-29 DIAGNOSIS — O24013 Pre-existing diabetes mellitus, type 1, in pregnancy, third trimester: Secondary | ICD-10-CM | POA: Insufficient documentation

## 2017-10-29 DIAGNOSIS — O0990 Supervision of high risk pregnancy, unspecified, unspecified trimester: Secondary | ICD-10-CM

## 2017-10-29 DIAGNOSIS — O24019 Pre-existing diabetes mellitus, type 1, in pregnancy, unspecified trimester: Secondary | ICD-10-CM

## 2017-10-29 DIAGNOSIS — E8729 Other acidosis: Secondary | ICD-10-CM

## 2017-10-29 DIAGNOSIS — O211 Hyperemesis gravidarum with metabolic disturbance: Secondary | ICD-10-CM

## 2017-10-29 DIAGNOSIS — E1022 Type 1 diabetes mellitus with diabetic chronic kidney disease: Secondary | ICD-10-CM | POA: Diagnosis present

## 2017-10-29 DIAGNOSIS — E109 Type 1 diabetes mellitus without complications: Secondary | ICD-10-CM | POA: Diagnosis present

## 2017-10-29 DIAGNOSIS — O358XX0 Maternal care for other (suspected) fetal abnormality and damage, not applicable or unspecified: Secondary | ICD-10-CM | POA: Diagnosis present

## 2017-10-29 DIAGNOSIS — O212 Late vomiting of pregnancy: Principal | ICD-10-CM | POA: Insufficient documentation

## 2017-10-29 LAB — CBC
HEMATOCRIT: 36 % (ref 36.0–46.0)
HEMATOCRIT: 31.2 % — AB (ref 36.0–46.0)
HEMOGLOBIN: 10.8 g/dL — AB (ref 12.0–15.0)
Hemoglobin: 12.4 g/dL (ref 12.0–15.0)
MCH: 28.8 pg (ref 26.0–34.0)
MCH: 28.8 pg (ref 26.0–34.0)
MCHC: 34.4 g/dL (ref 30.0–36.0)
MCHC: 34.6 g/dL (ref 30.0–36.0)
MCV: 83.5 fL (ref 80.0–100.0)
MCV: 83.2 fL (ref 80.0–100.0)
NRBC: 0 % (ref 0.0–0.2)
PLATELETS: 420 10*3/uL — AB (ref 150–400)
Platelets: 389 10*3/uL (ref 150–400)
RBC: 4.31 MIL/uL (ref 3.87–5.11)
RBC: 3.75 MIL/uL — ABNORMAL LOW (ref 3.87–5.11)
RDW: 12.3 % (ref 11.5–15.5)
RDW: 12.4 % (ref 11.5–15.5)
WBC: 7.9 10*3/uL (ref 4.0–10.5)
WBC: 6.8 10*3/uL (ref 4.0–10.5)
nRBC: 0 % (ref 0.0–0.2)

## 2017-10-29 LAB — CREATININE, SERUM
Creatinine, Ser: 0.63 mg/dL (ref 0.44–1.00)
GFR calc Af Amer: >60 mL/min (ref >60)
GFR calc non Af Amer: >60 mL/min (ref >60)

## 2017-10-29 LAB — COMPREHENSIVE METABOLIC PANEL
ALT: 14 U/L (ref 0–44)
AST: 28 U/L (ref 15–41)
Albumin: 2 g/dL — ABNORMAL LOW (ref 3.5–5.0)
Alkaline Phosphatase: 50 U/L (ref 38–126)
Anion gap: 13 (ref 5–15)
BUN: 11 mg/dL (ref 6–20)
CHLORIDE: 95 mmol/L — AB (ref 98–111)
CO2: 21 mmol/L — ABNORMAL LOW (ref 22–32)
CREATININE: 0.67 mg/dL (ref 0.44–1.00)
Calcium: 8.5 mg/dL — ABNORMAL LOW (ref 8.9–10.3)
Glucose, Bld: 176 mg/dL — ABNORMAL HIGH (ref 70–99)
Potassium: 4.3 mmol/L (ref 3.5–5.1)
Sodium: 129 mmol/L — ABNORMAL LOW (ref 135–145)
Total Bilirubin: 1.2 mg/dL (ref 0.3–1.2)
Total Protein: 6 g/dL — ABNORMAL LOW (ref 6.5–8.1)

## 2017-10-29 LAB — URINALYSIS, ROUTINE W REFLEX MICROSCOPIC
BACTERIA UA: NONE SEEN
Bilirubin Urine: NEGATIVE
Ketones, ur: 20 mg/dL — AB
LEUKOCYTES UA: NEGATIVE
NITRITE: NEGATIVE
Specific Gravity, Urine: 1.022 (ref 1.005–1.030)
pH: 6 (ref 5.0–8.0)

## 2017-10-29 LAB — GLUCOSE, CAPILLARY: GLUCOSE-CAPILLARY: 151 mg/dL — AB (ref 70–99)

## 2017-10-29 LAB — TSH: TSH: 2.768 u[IU]/mL (ref 0.350–4.500)

## 2017-10-29 LAB — HEMOGLOBIN A1C
Hgb A1c MFr Bld: 6.6 % — ABNORMAL HIGH (ref 4.8–5.6)
MEAN PLASMA GLUCOSE: 142.72 mg/dL

## 2017-10-29 MED ORDER — LABETALOL HCL 5 MG/ML IV SOLN
40.0000 mg | INTRAVENOUS | Status: DC | PRN
  Administered 2017-10-29 – 2017-10-31 (×2): 40 mg via INTRAVENOUS
  Filled 2017-10-29 (×2): qty 8

## 2017-10-29 MED ORDER — ACETAMINOPHEN 325 MG PO TABS: 650.0000 mg | ORAL_TABLET | ORAL | Status: DC | PRN

## 2017-10-29 MED ORDER — DOCUSATE SODIUM 100 MG PO CAPS
100.0000 mg | ORAL_CAPSULE | Freq: Every day | ORAL | Status: DC
  Administered 2017-10-30 – 2017-10-31 (×2): 100 mg via ORAL
  Filled 2017-10-29 (×2): qty 1

## 2017-10-29 MED ORDER — SERTRALINE HCL 50 MG PO TABS
50.0000 mg | ORAL_TABLET | Freq: Every day | ORAL | Status: DC
  Administered 2017-10-30 – 2017-10-31 (×2): 50 mg via ORAL
  Filled 2017-10-29 (×2): qty 1

## 2017-10-29 MED ORDER — LABETALOL HCL 200 MG PO TABS
400.0000 mg | ORAL_TABLET | Freq: Three times a day (TID) | ORAL | Status: DC
  Administered 2017-10-29 – 2017-10-31 (×5): 400 mg via ORAL
  Filled 2017-10-29 (×5): qty 2

## 2017-10-29 MED ORDER — METOCLOPRAMIDE HCL 10 MG PO TABS
10.0000 mg | ORAL_TABLET | Freq: Four times a day (QID) | ORAL | Status: DC
  Administered 2017-10-29 – 2017-10-31 (×6): 10 mg via ORAL
  Filled 2017-10-29 (×6): qty 1

## 2017-10-29 MED ORDER — PRENATAL MULTIVITAMIN CH
1.0000 | ORAL_TABLET | Freq: Every day | ORAL | Status: DC
  Administered 2017-10-30 – 2017-10-31 (×2): 1 via ORAL
  Filled 2017-10-29 (×3): qty 1

## 2017-10-29 MED ORDER — HYDRALAZINE HCL 20 MG/ML IJ SOLN
10.0000 mg | INTRAMUSCULAR | Status: DC | PRN
  Administered 2017-10-31: 10 mg via INTRAVENOUS
  Filled 2017-10-29: qty 1

## 2017-10-29 MED ORDER — INSULIN GLARGINE 100 UNIT/ML ~~LOC~~ SOLN
28.0000 [IU] | Freq: Every day | SUBCUTANEOUS | Status: DC
  Administered 2017-10-29 – 2017-10-30 (×2): 28 [IU] via SUBCUTANEOUS
  Filled 2017-10-29 (×2): qty 0.28

## 2017-10-29 MED ORDER — LACTATED RINGERS IV BOLUS
1000.0000 mL | Freq: Once | INTRAVENOUS | Status: AC
  Administered 2017-10-29: 1000 mL via INTRAVENOUS

## 2017-10-29 MED ORDER — SODIUM CHLORIDE 0.9 % IV BOLUS
1000.0000 mL | Freq: Once | INTRAVENOUS | Status: AC
  Administered 2017-10-29: 1000 mL via INTRAVENOUS

## 2017-10-29 MED ORDER — LABETALOL HCL 5 MG/ML IV SOLN
20.0000 mg | INTRAVENOUS | Status: DC | PRN
  Administered 2017-10-29 – 2017-10-30 (×2): 20 mg via INTRAVENOUS
  Filled 2017-10-29 (×2): qty 4

## 2017-10-29 MED ORDER — ZOLPIDEM TARTRATE 5 MG PO TABS
5.0000 mg | ORAL_TABLET | Freq: Every evening | ORAL | Status: DC | PRN
  Administered 2017-10-29 – 2017-10-30 (×2): 5 mg via ORAL
  Filled 2017-10-29 (×2): qty 1

## 2017-10-29 MED ORDER — ENOXAPARIN SODIUM 40 MG/0.4ML ~~LOC~~ SOLN
40.0000 mg | SUBCUTANEOUS | Status: DC
  Administered 2017-10-29 – 2017-10-30 (×2): 40 mg via SUBCUTANEOUS
  Filled 2017-10-29 (×2): qty 0.4

## 2017-10-29 MED ORDER — SODIUM CHLORIDE 0.9 % IV SOLN
8.0000 mg | Freq: Once | INTRAVENOUS | Status: AC
  Administered 2017-10-29: 8 mg via INTRAVENOUS
  Filled 2017-10-29: qty 4

## 2017-10-29 MED ORDER — LABETALOL HCL 5 MG/ML IV SOLN
80.0000 mg | INTRAVENOUS | Status: DC | PRN
  Administered 2017-10-29 – 2017-10-31 (×2): 80 mg via INTRAVENOUS
  Filled 2017-10-29 (×3): qty 16

## 2017-10-29 MED ORDER — SODIUM CHLORIDE 0.9 % IV SOLN
INTRAVENOUS | Status: DC
  Administered 2017-10-29 – 2017-10-31 (×4): via INTRAVENOUS

## 2017-10-29 MED ORDER — PROMETHAZINE HCL 25 MG/ML IJ SOLN
12.5000 mg | INTRAMUSCULAR | Status: DC
  Administered 2017-10-29 – 2017-10-30 (×6): 12.5 mg via INTRAVENOUS
  Filled 2017-10-29 (×6): qty 1

## 2017-10-29 MED ORDER — CALCIUM CARBONATE ANTACID 500 MG PO CHEW: 2.0000 | CHEWABLE_TABLET | ORAL | Status: DC | PRN

## 2017-10-29 MED ORDER — ASPIRIN 81 MG PO CHEW
81.0000 mg | CHEWABLE_TABLET | Freq: Every day | ORAL | Status: DC
  Administered 2017-10-30 – 2017-10-31 (×2): 81 mg via ORAL
  Filled 2017-10-29 (×2): qty 1

## 2017-10-29 MED ORDER — INSULIN ASPART 100 UNIT/ML ~~LOC~~ SOLN
7.0000 [IU] | Freq: Three times a day (TID) | SUBCUTANEOUS | Status: DC
  Administered 2017-10-30 – 2017-10-31 (×4): 7 [IU] via SUBCUTANEOUS

## 2017-10-29 MED ORDER — PROCHLORPERAZINE MALEATE 10 MG PO TABS
10.0000 mg | ORAL_TABLET | Freq: Four times a day (QID) | ORAL | Status: DC | PRN
  Filled 2017-10-29: qty 1

## 2017-10-29 NOTE — H&P (Signed)
Chief Complaint  Patient presents with  . Emesis   First Provider Initiated Contact with Patient 10/29/17 1632     S: Barbara Donovan  is a 27 y.o. y.o. year old G47P0 female at [redacted]w[redacted]d weeks gestation who presents to MAU with exacerbation of hyperemesis and severely elevated blood pressures.  Has diagnosis of chronic HTN. Current blood pressure medication: Labetalol 400 TID.  Unable to take medications due to nausea and vomiting.  Actively vomiting and MAU.  Unable to give a urine specimen.  States she has Zofran ODT, Phenergan (tablet and suppository), scopolamine patch, Compazine and they have been helping, but they stopped working very well yesterday.  States she does not have a ride and cannot take any sedating medications.  Protein creatinine ratio was 10 at previous MAU visit.  Patient states she has a history of proteinuria and had a 24-hour urine done earlier this pregnancy but does not know what the result was.  Associated symptoms: Neg for Headache, vision changes, epigastric pain Contractions: Denies Vaginal bleeding: Denies Fetal movement: Nml  O:  Patient Vitals for the past 24 hrs:  BP Temp Temp src Pulse Resp SpO2 Weight  10/29/17 2114 (!) 154/91 98 F (36.7 C) Oral 89 20 100 % -  10/29/17 2101 (!) 155/90 - - 97 - - -  10/29/17 2046 (!) 165/129 - - 82 - - -  10/29/17 2031 (!) 165/86 - - 89 - - -  10/29/17 2018 (!) 154/81 - - 81 - - -  10/29/17 2004 (!) 163/86 - - 89 - - -  10/29/17 1931 (!) 163/102 - - 91 - - -  10/29/17 1916 (!) 154/85 - - 97 - - -  10/29/17 1900 (!) 161/88 - - 93 - - -  10/29/17 1846 (!) 163/90 - - 95 - - -  10/29/17 1831 (!) 147/84 - - 87 - - -  10/29/17 1816 (!) 144/84 - - 89 - - -  10/29/17 1801 (!) 154/93 - - (!) 101 - - -  10/29/17 1746 (!) 170/90 - - 98 - - -  10/29/17 1730 (!) 144/85 - - 95 - - -  10/29/17 1716 (!) 146/83 - - 90 - - -  10/29/17 1701 (!) 150/85 - - 87 - - -  10/29/17 1631 (!) 158/92 - - (!) 103 - - -  10/29/17 1616 (!) 162/86  - - 93 - - -  10/29/17 1601 (!) 159/94 - - (!) 101 - - -  10/29/17 1551 (!) 158/91 - - (!) 101 - - -  10/29/17 1535 (!) 141/91 98.4 F (36.9 C) Oral 79 19 98 % 81.7 kg   General: NAD Head: Mucous membranes dry Heart: Regular rate Lungs: Normal rate and effort Abd: Soft, NT, Gravid, S=D Extremities: 1+ Pedal edema Neuro: Alert and oriented x3 Pelvic: NEFG, no bleeding or LOF.      EFM: 150, Moderate variability, 10 X 15 accelerations, no decelerations Toco: Irregular, mild with frequent uterine irritability.  Results for orders placed or performed during the hospital encounter of 10/29/17 (from the past 24 hour(s))  Urinalysis, Routine w reflex microscopic     Status: Abnormal   Collection Time: 10/29/17  4:24 PM  Result Value Ref Range   Color, Urine YELLOW YELLOW   APPearance HAZY (A) CLEAR   Specific Gravity, Urine 1.022 1.005 - 1.030   pH 6.0 5.0 - 8.0   Glucose, UA >=500 (A) NEGATIVE mg/dL   Hgb urine dipstick SMALL (A)  NEGATIVE   Bilirubin Urine NEGATIVE NEGATIVE   Ketones, ur 20 (A) NEGATIVE mg/dL   Protein, ur >=300 (A) NEGATIVE mg/dL   Nitrite NEGATIVE NEGATIVE   Leukocytes, UA NEGATIVE NEGATIVE   RBC / HPF 6-10 0 - 5 RBC/hpf   WBC, UA 11-20 0 - 5 WBC/hpf   Bacteria, UA NONE SEEN NONE SEEN   Squamous Epithelial / LPF 6-10 0 - 5   Mucus PRESENT    Hyaline Casts, UA PRESENT   Comprehensive metabolic panel     Status: Abnormal   Collection Time: 10/29/17  4:50 PM  Result Value Ref Range   Sodium 129 (L) 135 - 145 mmol/L   Potassium 4.3 3.5 - 5.1 mmol/L   Chloride 95 (L) 98 - 111 mmol/L   CO2 21 (L) 22 - 32 mmol/L   Glucose, Bld 176 (H) 70 - 99 mg/dL   BUN 11 6 - 20 mg/dL   Creatinine, Ser 0.67 0.44 - 1.00 mg/dL   Calcium 8.5 (L) 8.9 - 10.3 mg/dL   Total Protein 6.0 (L) 6.5 - 8.1 g/dL   Albumin 2.0 (L) 3.5 - 5.0 g/dL   AST 28 15 - 41 U/L   ALT 14 0 - 44 U/L   Alkaline Phosphatase 50 38 - 126 U/L   Total Bilirubin 1.2 0.3 - 1.2 mg/dL   GFR calc non Af Amer  >60 >60 mL/min   GFR calc Af Amer >60 >60 mL/min   Anion gap 13 5 - 15  CBC     Status: Abnormal   Collection Time: 10/29/17  4:50 PM  Result Value Ref Range   WBC 7.9 4.0 - 10.5 K/uL   RBC 4.31 3.87 - 5.11 MIL/uL   Hemoglobin 12.4 12.0 - 15.0 g/dL   HCT 36.0 36.0 - 46.0 %   MCV 83.5 80.0 - 100.0 fL   MCH 28.8 26.0 - 34.0 pg   MCHC 34.4 30.0 - 36.0 g/dL   RDW 12.3 11.5 - 15.5 %   Platelets 420 (H) 150 - 400 K/uL   nRBC 0.0 0.0 - 0.2 %  CBC     Status: Abnormal   Collection Time: 10/29/17  8:20 PM  Result Value Ref Range   WBC 6.8 4.0 - 10.5 K/uL   RBC 3.75 (L) 3.87 - 5.11 MIL/uL   Hemoglobin 10.8 (L) 12.0 - 15.0 g/dL   HCT 31.2 (L) 36.0 - 46.0 %   MCV 83.2 80.0 - 100.0 fL   MCH 28.8 26.0 - 34.0 pg   MCHC 34.6 30.0 - 36.0 g/dL   RDW 12.4 11.5 - 15.5 %   Platelets 389 150 - 400 K/uL   nRBC 0.0 0.0 - 0.2 %  Creatinine, serum     Status: None   Collection Time: 10/29/17  8:20 PM  Result Value Ref Range   Creatinine, Ser 0.63 0.44 - 1.00 mg/dL   GFR calc non Af Amer >60 >60 mL/min   GFR calc Af Amer >60 >60 mL/min  TSH     Status: None   Collection Time: 10/29/17  8:20 PM  Result Value Ref Range   TSH 2.768 0.350 - 4.500 uIU/mL    MAU course Orders Placed This Encounter  Procedures  . Urinalysis, Routine w reflex microscopic  . Comprehensive metabolic panel  . CBC   Meds ordered this encounter  Medications  . lactated ringers bolus 1,000 mL  . ondansetron (ZOFRAN) 8 mg in sodium chloride 0.9 % 50 mL IVPB  .  AND Linked Order Group   . labetalol (NORMODYNE,TRANDATE) injection 20 mg   . labetalol (NORMODYNE,TRANDATE) injection 40 mg   . labetalol (NORMODYNE,TRANDATE) injection 80 mg   . hydrALAZINE (APRESOLINE) injection 10 mg  . sodium chloride 0.9 % bolus 1,000 mL  Severe range blood pressure noted.  IV labetalol ordered  MDM -Exacerbation of hyperemesis gravidarum.  Inadequate treatment with Zofran.  Still having nausea and dry heaving.  Dehydration treated  with fluid bolus.  Now on third liter.  States Phenergan usually helps, but she cannot receive Phenergan since she does not have someone to drive her home.  -Type 1 diabetes with hyperglycemia, mild ketonuria and anion gap of 13, borderline DKA.  Treating with fluid bolus and plan for insulin.  Do not feel that patient is candidate for discharge since she cannot tolerate p.o.'s.  -Chronic hypertension in pregnancy with some severe range blood pressures.  Possibility the patient may be developing superimposed preeclampsia. Received 3 doses of IV labetalol 20, 40 and 80 mg in maternity admissions.  Unable to tolerate oral medications due to exacerbation of hyperemesis.  Requires inpatient management of blood pressures.   A: [redacted]w[redacted]d week IUP Uncontrolled Chronic hypertension 2/2 not being able to keep down meds/possible superimposed preeclampsia Type I DM w/ borderline DKA. Unable to eat 2/2/ Hyperemesis.  Hyperemesis  FHR reactive  P: Admit to [facility] per consult with [name], MD. Continue home insulin regimen Antiemetics PRN IV/p.o. labetalol  Tamala Julian [place], Bier 10/29/2017 9:49 PM

## 2017-10-29 NOTE — MAU Provider Note (Signed)
Chief Complaint  Patient presents with  . Emesis   First Provider Initiated Contact with Patient 10/29/17 1632     S: Barbara Donovan  is a 27 y.o. y.o. year old G48P0 female at [redacted]w[redacted]d weeks gestation who presents to MAU with exacerbation of hyperemesis and severely elevated blood pressures.  Has diagnosis of chronic HTN. Current blood pressure medication: Labetalol 400 TID.  Unable to take medications due to nausea and vomiting.  Actively vomiting and MAU.  Unable to give a urine specimen.  States she has Zofran ODT, Phenergan (tablet and suppository), scopolamine patch, Compazine and they have been helping, but they stopped working very well yesterday.  States she does not have a ride and cannot take any sedating medications.  Protein creatinine ratio was 10 at previous MAU visit.  Patient states she has a history of proteinuria and had a 24-hour urine done earlier this pregnancy but does not know what the result was.  Associated symptoms: Neg for Headache, vision changes, epigastric pain Contractions: Denies Vaginal bleeding: Denies Fetal movement: Nml  O:  Patient Vitals for the past 24 hrs:  BP Temp Temp src Pulse Resp SpO2 Weight  10/29/17 2114 (!) 154/91 98 F (36.7 C) Oral 89 20 100 % -  10/29/17 2101 (!) 155/90 - - 97 - - -  10/29/17 2046 (!) 165/129 - - 82 - - -  10/29/17 2031 (!) 165/86 - - 89 - - -  10/29/17 2018 (!) 154/81 - - 81 - - -  10/29/17 2004 (!) 163/86 - - 89 - - -  10/29/17 1931 (!) 163/102 - - 91 - - -  10/29/17 1916 (!) 154/85 - - 97 - - -  10/29/17 1900 (!) 161/88 - - 93 - - -  10/29/17 1846 (!) 163/90 - - 95 - - -  10/29/17 1831 (!) 147/84 - - 87 - - -  10/29/17 1816 (!) 144/84 - - 89 - - -  10/29/17 1801 (!) 154/93 - - (!) 101 - - -  10/29/17 1746 (!) 170/90 - - 98 - - -  10/29/17 1730 (!) 144/85 - - 95 - - -  10/29/17 1716 (!) 146/83 - - 90 - - -  10/29/17 1701 (!) 150/85 - - 87 - - -  10/29/17 1631 (!) 158/92 - - (!) 103 - - -  10/29/17 1616 (!) 162/86  - - 93 - - -  10/29/17 1601 (!) 159/94 - - (!) 101 - - -  10/29/17 1551 (!) 158/91 - - (!) 101 - - -  10/29/17 1535 (!) 141/91 98.4 F (36.9 C) Oral 79 19 98 % 81.7 kg   General: NAD Head: Mucous membranes dry Heart: Regular rate Lungs: Normal rate and effort Abd: Soft, NT, Gravid, S=D Extremities: 1+ Pedal edema Neuro: Alert and oriented x3 Pelvic: NEFG, no bleeding or LOF.      EFM: 150, Moderate variability, 10 X 15 accelerations, no decelerations Toco: Irregular, mild with frequent uterine irritability.  Results for orders placed or performed during the hospital encounter of 10/29/17 (from the past 24 hour(s))  Urinalysis, Routine w reflex microscopic     Status: Abnormal   Collection Time: 10/29/17  4:24 PM  Result Value Ref Range   Color, Urine YELLOW YELLOW   APPearance HAZY (A) CLEAR   Specific Gravity, Urine 1.022 1.005 - 1.030   pH 6.0 5.0 - 8.0   Glucose, UA >=500 (A) NEGATIVE mg/dL   Hgb urine dipstick SMALL (A)  NEGATIVE   Bilirubin Urine NEGATIVE NEGATIVE   Ketones, ur 20 (A) NEGATIVE mg/dL   Protein, ur >=300 (A) NEGATIVE mg/dL   Nitrite NEGATIVE NEGATIVE   Leukocytes, UA NEGATIVE NEGATIVE   RBC / HPF 6-10 0 - 5 RBC/hpf   WBC, UA 11-20 0 - 5 WBC/hpf   Bacteria, UA NONE SEEN NONE SEEN   Squamous Epithelial / LPF 6-10 0 - 5   Mucus PRESENT    Hyaline Casts, UA PRESENT   Comprehensive metabolic panel     Status: Abnormal   Collection Time: 10/29/17  4:50 PM  Result Value Ref Range   Sodium 129 (L) 135 - 145 mmol/L   Potassium 4.3 3.5 - 5.1 mmol/L   Chloride 95 (L) 98 - 111 mmol/L   CO2 21 (L) 22 - 32 mmol/L   Glucose, Bld 176 (H) 70 - 99 mg/dL   BUN 11 6 - 20 mg/dL   Creatinine, Ser 0.67 0.44 - 1.00 mg/dL   Calcium 8.5 (L) 8.9 - 10.3 mg/dL   Total Protein 6.0 (L) 6.5 - 8.1 g/dL   Albumin 2.0 (L) 3.5 - 5.0 g/dL   AST 28 15 - 41 U/L   ALT 14 0 - 44 U/L   Alkaline Phosphatase 50 38 - 126 U/L   Total Bilirubin 1.2 0.3 - 1.2 mg/dL   GFR calc non Af Amer  >60 >60 mL/min   GFR calc Af Amer >60 >60 mL/min   Anion gap 13 5 - 15  CBC     Status: Abnormal   Collection Time: 10/29/17  4:50 PM  Result Value Ref Range   WBC 7.9 4.0 - 10.5 K/uL   RBC 4.31 3.87 - 5.11 MIL/uL   Hemoglobin 12.4 12.0 - 15.0 g/dL   HCT 36.0 36.0 - 46.0 %   MCV 83.5 80.0 - 100.0 fL   MCH 28.8 26.0 - 34.0 pg   MCHC 34.4 30.0 - 36.0 g/dL   RDW 12.3 11.5 - 15.5 %   Platelets 420 (H) 150 - 400 K/uL   nRBC 0.0 0.0 - 0.2 %  CBC     Status: Abnormal   Collection Time: 10/29/17  8:20 PM  Result Value Ref Range   WBC 6.8 4.0 - 10.5 K/uL   RBC 3.75 (L) 3.87 - 5.11 MIL/uL   Hemoglobin 10.8 (L) 12.0 - 15.0 g/dL   HCT 31.2 (L) 36.0 - 46.0 %   MCV 83.2 80.0 - 100.0 fL   MCH 28.8 26.0 - 34.0 pg   MCHC 34.6 30.0 - 36.0 g/dL   RDW 12.4 11.5 - 15.5 %   Platelets 389 150 - 400 K/uL   nRBC 0.0 0.0 - 0.2 %  Creatinine, serum     Status: None   Collection Time: 10/29/17  8:20 PM  Result Value Ref Range   Creatinine, Ser 0.63 0.44 - 1.00 mg/dL   GFR calc non Af Amer >60 >60 mL/min   GFR calc Af Amer >60 >60 mL/min  TSH     Status: None   Collection Time: 10/29/17  8:20 PM  Result Value Ref Range   TSH 2.768 0.350 - 4.500 uIU/mL    MAU course Orders Placed This Encounter  Procedures  . Urinalysis, Routine w reflex microscopic  . Comprehensive metabolic panel  . CBC   Meds ordered this encounter  Medications  . lactated ringers bolus 1,000 mL  . ondansetron (ZOFRAN) 8 mg in sodium chloride 0.9 % 50 mL IVPB  .  AND Linked Order Group   . labetalol (NORMODYNE,TRANDATE) injection 20 mg   . labetalol (NORMODYNE,TRANDATE) injection 40 mg   . labetalol (NORMODYNE,TRANDATE) injection 80 mg   . hydrALAZINE (APRESOLINE) injection 10 mg  . sodium chloride 0.9 % bolus 1,000 mL  Severe range blood pressure noted.  IV labetalol ordered  MDM -Exacerbation of hyperemesis gravidarum.  Inadequate treatment with Zofran.  Still having nausea and dry heaving.  Dehydration treated  with fluid bolus.  Now on third liter.  States Phenergan usually helps, but she cannot receive Phenergan since she does not have someone to drive her home.  -Type 1 diabetes with hyperglycemia, mild ketonuria and anion gap of 13, borderline DKA.  Treating with fluid bolus and plan for insulin.  Do not feel that patient is candidate for discharge since she cannot tolerate p.o.'s.  -Chronic hypertension in pregnancy with some severe range blood pressures.  Possibility the patient may be developing superimposed preeclampsia. Received 3 doses of IV labetalol 20, 40 and 80 mg in maternity admissions.  Unable to tolerate oral medications due to exacerbation of hyperemesis.  Requires inpatient management of blood pressures.   A: [redacted]w[redacted]d week IUP Uncontrolled Chronic hypertension 2/2 not being able to keep down meds/possible superimposed preeclampsia Type I DM w/ borderline DKA. Unable to eat 2/2/ Hyperemesis.  Hyperemesis  FHR reactive  P: Admit to [facility] per consult with [name], MD. See H&P for POC.   Tamala Julian, [place], [place] 10/29/2017 9:26 PM

## 2017-10-29 NOTE — MAU Note (Signed)
Barbara Donovan is a 27 y.o. at 107w3d here in MAU reporting: +N/V gotten worse since last night. States has not been able to keep anything down. Has been an ongoing problem. Currently has scopolamine patch on. Tried Zofran and phenergan as well as a suppository with no relief. Pain score: denies Vitals:   10/31/17 0510 10/31/17 0839  BP: (!) 155/97 (!) 153/93  Pulse: 86 86  Resp: 20 18  Temp: 98.2 F (36.8 C) 98.1 F (36.7 C)  SpO2: 99% 100%    Lab orders placed from triage: ua

## 2017-10-30 DIAGNOSIS — O211 Hyperemesis gravidarum with metabolic disturbance: Secondary | ICD-10-CM

## 2017-10-30 DIAGNOSIS — O10019 Pre-existing essential hypertension complicating pregnancy, unspecified trimester: Secondary | ICD-10-CM | POA: Diagnosis not present

## 2017-10-30 DIAGNOSIS — O359XX0 Maternal care for (suspected) fetal abnormality and damage, unspecified, not applicable or unspecified: Secondary | ICD-10-CM | POA: Diagnosis present

## 2017-10-30 DIAGNOSIS — E872 Acidosis: Secondary | ICD-10-CM | POA: Diagnosis present

## 2017-10-30 DIAGNOSIS — O358XX0 Maternal care for other (suspected) fetal abnormality and damage, not applicable or unspecified: Secondary | ICD-10-CM | POA: Diagnosis present

## 2017-10-30 DIAGNOSIS — O212 Late vomiting of pregnancy: Secondary | ICD-10-CM | POA: Diagnosis not present

## 2017-10-30 HISTORY — DX: Hyperemesis gravidarum with metabolic disturbance: O21.1

## 2017-10-30 LAB — COMPREHENSIVE METABOLIC PANEL
ALK PHOS: 37 U/L — AB (ref 38–126)
ALT: 12 U/L (ref 0–44)
AST: 15 U/L (ref 15–41)
Albumin: 1.6 g/dL — ABNORMAL LOW (ref 3.5–5.0)
Anion gap: 6 (ref 5–15)
BILIRUBIN TOTAL: 0.4 mg/dL (ref 0.3–1.2)
BUN: 9 mg/dL (ref 6–20)
CALCIUM: 7.8 mg/dL — AB (ref 8.9–10.3)
CHLORIDE: 104 mmol/L (ref 98–111)
CO2: 23 mmol/L (ref 22–32)
CREATININE: 0.59 mg/dL (ref 0.44–1.00)
Glucose, Bld: 146 mg/dL — ABNORMAL HIGH (ref 70–99)
Potassium: 3.7 mmol/L (ref 3.5–5.1)
Sodium: 133 mmol/L — ABNORMAL LOW (ref 135–145)
TOTAL PROTEIN: 4.6 g/dL — AB (ref 6.5–8.1)

## 2017-10-30 LAB — GLUCOSE, CAPILLARY
GLUCOSE-CAPILLARY: 127 mg/dL — AB (ref 70–99)
GLUCOSE-CAPILLARY: 168 mg/dL — AB (ref 70–99)
GLUCOSE-CAPILLARY: 117 mg/dL — AB (ref 70–99)
GLUCOSE-CAPILLARY: 126 mg/dL — AB (ref 70–99)
Glucose-Capillary: 160 mg/dL — ABNORMAL HIGH (ref 70–99)

## 2017-10-30 MED ORDER — PROMETHAZINE HCL 25 MG PO TABS: 25.0000 mg | ORAL_TABLET | Freq: Four times a day (QID) | ORAL | Status: DC | PRN

## 2017-10-30 NOTE — Progress Notes (Signed)
[place] ANTEPARTUM PROGRESS NOTE  Barbara Donovan is a 27 y.o. G1P0 at [redacted]w[redacted]d who is admitted for N/V in pregnancy, severe hypertension, anion gap acidosis.  Estimated Date of Delivery: 01/25/18 Fetal presentation is unsure.  Length of Stay:  0 Days. Admitted 10/29/2017  Subjective: Patient reports normal fetal movement.  She denies uterine contractions, denies bleeding and leaking of fluid per vagina. She is feeling much better, states she has been able to eat multiple times today and feels ready for discharge home.   Vitals:  Blood pressure 137/85, pulse 96, temperature 99.5 F (37.5 C), temperature source Oral, resp. rate 18, weight 81.7 kg, last menstrual period 04/02/2017, SpO2 100 %. Physical Examination: CONSTITUTIONAL: Well-developed, well-nourished female in no acute distress.  HENT:  Normocephalic, atraumatic, External right and left ear normal. Oropharynx is clear and moist EYES: Conjunctivae and EOM are normal. Pupils are equal, round, and reactive to light. No scleral icterus.  NECK: Normal range of motion, supple, no masses. SKIN: Skin is warm and dry. No rash noted. Not diaphoretic. No erythema. No pallor. [place]: Alert and oriented to person, place, and time. Normal reflexes, muscle tone coordination. No cranial nerve deficit noted. PSYCHIATRIC: Normal mood and affect. Normal behavior. Normal judgment and thought content. CARDIOVASCULAR: Normal heart rate noted, regular rhythm RESPIRATORY: Effort and breath sounds normal, no problems with respiration noted MUSCULOSKELETAL: Normal range of motion. No edema and no tenderness. ABDOMEN: Soft, nontender, nondistended, gravid. CERVIX: deferred  Fetal monitoring: FHR: reactive NST  Uterine activity: no contractions per hour   No results found.  Current scheduled medications . aspirin  81 mg Oral Daily  . docusate sodium  100 mg Oral Daily  . enoxaparin (LOVENOX) injection  40 mg Subcutaneous Q24H  . insulin aspart   7 Units Subcutaneous TID WC  . insulin glargine  28 Units Subcutaneous QHS  . labetalol  400 mg Oral Q8H  . metoCLOPramide  10 mg Oral Q6H  . prenatal multivitamin  1 tablet Oral Q1200  . sertraline  50 mg Oral Daily    I have reviewed the patient's current medications.  ASSESSMENT: Active Problems:   Supervision of high risk pregnancy, antepartum   Type 1 diabetes mellitus affecting pregnancy, antepartum   Hypertension in pregnancy, antepartum   Increased anion gap metabolic acidosis   Pregnancy complicated by multiple fetal congenital anomalies   Nausea and vomiting in pregnancy   PLAN: Patient much improved with nausea/vomiting, has eaten multiple times today however she has been on scheduled IV phenergan. Will switch to all PO medication and see how she tolerates her medications. FG elevated this am, she reports she had milk and graham crackers last pm which would elevated fasting. She also reports that when she has been increased to 30 units lantus QHS in past, it has made her have episodes of hypoglycemia. Will maintain on current insulin regimen for now and likely dc home in am if she is able to tolerate meds/food.  Of note, patient has been getting care with [facility] and reports she would like to transfer care to high risk clinic here if possible, reviewed [place], patient desires to transfer care. Will make arrangements on discharge for her to follow up in [place].    Continue routine antenatal care.   Feliz Beam, M.D. [facility]  10/30/2017 5:03 PM

## 2017-10-30 NOTE — Progress Notes (Signed)
Patient ID: Barbara Donovan, female   DOB: 1990-06-19, 27 y.o.   MRN: [id number] [place]) NOTE  Barbara Donovan is a 27 y.o. G1P0 at [redacted]w[redacted]d by best clinical estimate who is admitted for N/V in pregnancy, anion gap acidosis and severe hypertension.   Fetal presentation is unsure. Length of Stay:  0  Days  Subjective: Feels much better, has stopped vomiting. BP improved with meds. Patient reports the fetal movement as active. Patient reports uterine contraction  activity as none. Patient reports  vaginal bleeding as none. Patient describes fluid per vagina as None.  Vitals:  Blood pressure 133/79, pulse 84, temperature 97.8 F (36.6 C), temperature source Oral, resp. rate 15, weight 81.7 kg, last menstrual period 04/02/2017, SpO2 100 %. Physical Examination:  General appearance - alert, well appearing, and in no distress Chest - normal effort Abdomen - gravid, non-tender Fundal Height:  size equals dates Extremities: Homans sign is negative, no sign of DVT  Membranes:intact  Fetal Monitoring:  Baseline: 145 bpm, Variability: Good {> 6 bpm), Accelerations: Non-reactive but appropriate for gestational age and Decelerations: Absent  Labs:  Results for orders placed or performed during the hospital encounter of 10/29/17 (from the past 24 hour(s))  Urinalysis, Routine w reflex microscopic   Collection Time: 10/29/17  4:24 PM  Result Value Ref Range   Color, Urine YELLOW YELLOW   APPearance HAZY (A) CLEAR   Specific Gravity, Urine 1.022 1.005 - 1.030   pH 6.0 5.0 - 8.0   Glucose, UA >=500 (A) NEGATIVE mg/dL   Hgb urine dipstick SMALL (A) NEGATIVE   Bilirubin Urine NEGATIVE NEGATIVE   Ketones, ur 20 (A) NEGATIVE mg/dL   Protein, ur >=300 (A) NEGATIVE mg/dL   Nitrite NEGATIVE NEGATIVE   Leukocytes, UA NEGATIVE NEGATIVE   RBC / HPF 6-10 0 - 5 RBC/hpf   WBC, UA 11-20 0 - 5 WBC/hpf   Bacteria, UA NONE SEEN NONE SEEN   Squamous Epithelial / LPF 6-10 0 - 5    Mucus PRESENT    Hyaline Casts, UA PRESENT   Comprehensive metabolic panel   Collection Time: 10/29/17  4:50 PM  Result Value Ref Range   Sodium 129 (L) 135 - 145 mmol/L   Potassium 4.3 3.5 - 5.1 mmol/L   Chloride 95 (L) 98 - 111 mmol/L   CO2 21 (L) 22 - 32 mmol/L   Glucose, Bld 176 (H) 70 - 99 mg/dL   BUN 11 6 - 20 mg/dL   Creatinine, Ser 0.67 0.44 - 1.00 mg/dL   Calcium 8.5 (L) 8.9 - 10.3 mg/dL   Total Protein 6.0 (L) 6.5 - 8.1 g/dL   Albumin 2.0 (L) 3.5 - 5.0 g/dL   AST 28 15 - 41 U/L   ALT 14 0 - 44 U/L   Alkaline Phosphatase 50 38 - 126 U/L   Total Bilirubin 1.2 0.3 - 1.2 mg/dL   GFR calc non Af Amer >60 >60 mL/min   GFR calc Af Amer >60 >60 mL/min   Anion gap 13 5 - 15  CBC   Collection Time: 10/29/17  4:50 PM  Result Value Ref Range   WBC 7.9 4.0 - 10.5 K/uL   RBC 4.31 3.87 - 5.11 MIL/uL   Hemoglobin 12.4 12.0 - 15.0 g/dL   HCT 36.0 36.0 - 46.0 %   MCV 83.5 80.0 - 100.0 fL   MCH 28.8 26.0 - 34.0 pg   MCHC 34.4 30.0 - 36.0 g/dL   RDW 12.3 11.5 -  15.5 %   Platelets 420 (H) 150 - 400 K/uL   nRBC 0.0 0.0 - 0.2 %  CBC   Collection Time: 10/29/17  8:20 PM  Result Value Ref Range   WBC 6.8 4.0 - 10.5 K/uL   RBC 3.75 (L) 3.87 - 5.11 MIL/uL   Hemoglobin 10.8 (L) 12.0 - 15.0 g/dL   HCT 31.2 (L) 36.0 - 46.0 %   MCV 83.2 80.0 - 100.0 fL   MCH 28.8 26.0 - 34.0 pg   MCHC 34.6 30.0 - 36.0 g/dL   RDW 12.4 11.5 - 15.5 %   Platelets 389 150 - 400 K/uL   nRBC 0.0 0.0 - 0.2 %  Creatinine, serum   Collection Time: 10/29/17  8:20 PM  Result Value Ref Range   Creatinine, Ser 0.63 0.44 - 1.00 mg/dL   GFR calc non Af Amer >60 >60 mL/min   GFR calc Af Amer >60 >60 mL/min  Hemoglobin A1c   Collection Time: 10/29/17  8:20 PM  Result Value Ref Range   Hgb A1c MFr Bld 6.6 (H) 4.8 - 5.6 %   Mean Plasma Glucose 142.72 mg/dL  TSH   Collection Time: 10/29/17  8:20 PM  Result Value Ref Range   TSH 2.768 0.350 - 4.500 uIU/mL  Glucose, capillary   Collection Time: 10/29/17 10:58  PM  Result Value Ref Range   Glucose-Capillary 151 (H) 70 - 99 mg/dL  Comprehensive metabolic panel   Collection Time: 10/30/17  5:54 AM  Result Value Ref Range   Sodium 133 (L) 135 - 145 mmol/L   Potassium 3.7 3.5 - 5.1 mmol/L   Chloride 104 98 - 111 mmol/L   CO2 23 22 - 32 mmol/L   Glucose, Bld 146 (H) 70 - 99 mg/dL   BUN 9 6 - 20 mg/dL   Creatinine, Ser 0.59 0.44 - 1.00 mg/dL   Calcium 7.8 (L) 8.9 - 10.3 mg/dL   Total Protein 4.6 (L) 6.5 - 8.1 g/dL   Albumin 1.6 (L) 3.5 - 5.0 g/dL   AST 15 15 - 41 U/L   ALT 12 0 - 44 U/L   Alkaline Phosphatase 37 (L) 38 - 126 U/L   Total Bilirubin 0.4 0.3 - 1.2 mg/dL   GFR calc non Af Amer >60 >60 mL/min   GFR calc Af Amer >60 >60 mL/min   Anion gap 6 5 - 15     Medications:  Scheduled . aspirin  81 mg Oral Daily  . docusate sodium  100 mg Oral Daily  . enoxaparin (LOVENOX) injection  40 mg Subcutaneous Q24H  . insulin aspart  7 Units Subcutaneous TID WC  . insulin glargine  28 Units Subcutaneous QHS  . labetalol  400 mg Oral Q8H  . metoCLOPramide  10 mg Oral Q6H  . prenatal multivitamin  1 tablet Oral Q1200  . promethazine  12.5 mg Intravenous Q4H  . sertraline  50 mg Oral Daily   I have reviewed the patient's current medications.  ASSESSMENT: Active Problems:   Hypertension in pregnancy, antepartum   PLAN: Improving. Patient was too sleepy to properly assess now. Likely able to be discharged later today or in am.  Barbara Jude, MD 10/30/2017,7:50 AM

## 2017-10-31 ENCOUNTER — Telehealth: Payer: Self-pay | Admitting: Obstetrics and Gynecology

## 2017-10-31 DIAGNOSIS — O10019 Pre-existing essential hypertension complicating pregnancy, unspecified trimester: Secondary | ICD-10-CM | POA: Diagnosis not present

## 2017-10-31 DIAGNOSIS — O24019 Pre-existing diabetes mellitus, type 1, in pregnancy, unspecified trimester: Secondary | ICD-10-CM

## 2017-10-31 DIAGNOSIS — O212 Late vomiting of pregnancy: Secondary | ICD-10-CM | POA: Diagnosis not present

## 2017-10-31 DIAGNOSIS — O219 Vomiting of pregnancy, unspecified: Secondary | ICD-10-CM | POA: Diagnosis not present

## 2017-10-31 DIAGNOSIS — O358XX0 Maternal care for other (suspected) fetal abnormality and damage, not applicable or unspecified: Secondary | ICD-10-CM

## 2017-10-31 LAB — GLUCOSE, CAPILLARY
GLUCOSE-CAPILLARY: 92 mg/dL (ref 70–99)
GLUCOSE-CAPILLARY: 69 mg/dL — AB (ref 70–99)

## 2017-10-31 MED ORDER — INSULIN ASPART 100 UNIT/ML FLEXPEN: 7.0000 [IU] | PEN_INJECTOR | Freq: Three times a day (TID) | SUBCUTANEOUS | 3 refills | Status: DC

## 2017-10-31 MED ORDER — NIFEDIPINE ER OSMOTIC RELEASE 30 MG PO TB24
30.0000 mg | ORAL_TABLET | Freq: Every day | ORAL | Status: DC
  Administered 2017-10-31: 30 mg via ORAL
  Filled 2017-10-31: qty 1

## 2017-10-31 MED ORDER — LABETALOL HCL 200 MG PO TABS: 400.0000 mg | ORAL_TABLET | Freq: Three times a day (TID) | ORAL | 3 refills | Status: DC

## 2017-10-31 NOTE — Discharge Instructions (Signed)
Hyperemesis Gravidarum °Hyperemesis gravidarum is a severe form of nausea and vomiting that happens during pregnancy. Hyperemesis is worse than morning sickness. It may cause you to have nausea or vomiting all day for many days. It may keep you from eating and drinking enough food and liquids. Hyperemesis usually occurs during the first half (the first 20 weeks) of pregnancy. It often goes away once a woman is in her second half of pregnancy. However, sometimes hyperemesis continues through an entire pregnancy. °What are the causes? °The cause of this condition is not known. It may be related to changes in chemicals (hormones) in the body during pregnancy, such as the high level of pregnancy hormone (human chorionic gonadotropin) or the increase in the female sex hormone (estrogen). °What are the signs or symptoms? °Symptoms of this condition include: °· Severe nausea and vomiting. °· Nausea that does not go away. °· Vomiting that does not allow you to keep any food down. °· Weight loss. °· Body fluid loss (dehydration). °· Having no desire to eat, or not liking food that you have previously enjoyed. ° °How is this diagnosed? °This condition may be diagnosed based on: °· A physical exam. °· Your medical history. °· Your symptoms. °· Blood tests. °· Urine tests. ° °How is this treated? °This condition may be managed with medicine. If medicines to do not help relieve nausea and vomiting, you may need to receive fluids through an IV tube at the hospital. °Follow these instructions at home: °· Take over-the-counter and prescription medicines only as told by your health care provider. °· Avoid iron pills and multivitamins that contain iron for the first 3-4 months of pregnancy. If you take prescription iron pills, do not stop taking them unless your health care provider approves. °· Take the following actions to help prevent nausea and vomiting: °? In the morning, before getting out of bed, try eating a couple of dry  crackers or a piece of toast. °? Avoid foods and smells that upset your stomach. Fatty and spicy foods may make nausea worse. °? Eat 5-6 small meals a day. °? Do not drink fluids while eating meals. Drink between meals. °? Eat or suck on things that have ginger in them. Ginger can help relieve nausea. °? Avoid food preparation. The smell of food can spoil your appetite or trigger nausea. °· Follow instructions from your health care provider about eating or drinking restrictions. °· For snacks, eat high-protein foods, such as cheese. °· Keep all follow-up and pre-birth (prenatal) visits as told by your health care provider. This is important. °Contact a health care provider if: °· You have pain in your abdomen. °· You have a severe headache. °· You have vision problems. °· You are losing weight. °Get help right away if: °· You cannot drink fluids without vomiting. °· You vomit blood. °· You have Jarmar Rousseau nausea and vomiting. °· You are very weak. °· You are very thirsty. °· You feel dizzy. °· You faint. °· You have a fever or other symptoms that last for more than 2-3 days. °· You have a fever and your symptoms suddenly get worse. °Summary °· Hyperemesis gravidarum is a severe form of nausea and vomiting that happens during pregnancy. °· Making some changes to your eating habits may help relieve nausea and vomiting. °· This condition may be managed with medicine. °· If medicines to do not help relieve nausea and vomiting, you may need to receive fluids through an IV tube at the hospital. °This   information is not intended to replace advice given to you by your health care provider. Make sure you discuss any questions you have with your health care provider. °Document Released: 01/04/2005 Document Revised: 09/03/2015 Document Reviewed: 09/03/2015 °Elsevier Interactive Patient Education © 2017 [employer] ° °

## 2017-10-31 NOTE — Progress Notes (Signed)
Discharged home, ambulatory, in stable condition. 

## 2017-10-31 NOTE — Progress Notes (Signed)
Discharge instructions given to patient and she verbalized understanding of all instructions given. Written copy of AVS given to patient.

## 2017-10-31 NOTE — Discharge Summary (Signed)
OB Discharge Summary     Patient Name: Barbara Donovan DOB: 1990-03-19 MRN: [id number]  Date of admission: 10/29/2017 Delivering MD: This patient has no babies on file.  Date of discharge: 10/31/2017  Admitting diagnosis: 27 WKS, NAUSEA, VOMITING Intrauterine pregnancy: [redacted]w[redacted]d     Secondary diagnosis:  Active Problems:   Supervision of high risk pregnancy, antepartum   Type 1 diabetes mellitus affecting pregnancy, antepartum   Hypertension in pregnancy, antepartum   Increased anion gap metabolic acidosis   Pregnancy complicated by multiple fetal congenital anomalies   Nausea and vomiting in pregnancy     Discharge diagnosis: Resolved hyperemesis in third trimester, Type 1 DM and [facility] course:  Patient admitted secondary to hyperemesis in third trimester. Patient received IV fluids and antiemetics. She was able to tolerate several meals and was found stable for discharge. Patient plans to transfer care from [facility] to [facility] hospital. Discharge instructions were reviewed with the patient. Patient to follow up as scheduled with endocrinologist. Continue antihypertensive as prescribed.  Physical exam  Vitals:   10/31/17 0040 10/31/17 0111 10/31/17 0510 10/31/17 0839  BP: (!) 167/94 135/77 (!) 155/97 (!) 153/93  Pulse: 88 92 86 86  Resp:  15 20 18   Temp:   98.2 F (36.8 C) 98.1 F (36.7 C)  TempSrc:   Oral Oral  SpO2:   99% 100%  Weight:      Height:       General: alert, cooperative and no distress Abdomine: soft, gravid, non tender DVT Evaluation: No evidence of DVT seen on physical exam. No cords or calf tenderness.  NST: baseline 130, mod variability, +10x10 accels, no decels  Labs: Lab Results  Component Value Date   WBC 6.8 10/29/2017   HGB 10.8 (L) 10/29/2017   HCT 31.2 (L) 10/29/2017   MCV 83.2 10/29/2017   PLT 389 10/29/2017   CMP Latest Ref Rng & Units 10/30/2017  Glucose 70 - 99 mg/dL 146(H)  BUN 6 - 20  mg/dL 9  Creatinine 0.44 - 1.00 mg/dL 0.59  Sodium 135 - 145 mmol/L 133(L)  Potassium 3.5 - 5.1 mmol/L 3.7  Chloride 98 - 111 mmol/L 104  CO2 22 - 32 mmol/L 23  Calcium 8.9 - 10.3 mg/dL 7.8(L)  Total Protein 6.5 - 8.1 g/dL 4.6(L)  Total Bilirubin 0.3 - 1.2 mg/dL 0.4  Alkaline Phos 38 - 126 U/L 37(L)  AST 15 - 41 U/L 15  ALT 0 - 44 U/L 12    Discharge instruction: per After Visit Summary and "Baby and Me Booklet".  After visit meds:  Allergies as of 10/31/2017   No Known Allergies     Medication List    TAKE these medications   aspirin 81 MG chewable tablet Chew 81 mg by mouth daily.   B-6 PO Take 1 tablet by mouth daily.   BASAGLAR KWIKPEN 100 UNIT/ML Sopn Inject 0.28 mLs (28 Units total) into the skin at bedtime.   Doxylamine-Pyridoxine 10-10 MG Tbec 2 tablets on an empty stomach at bedtime on day 1 and 2. If symptoms persist, take 1 tablet in the morning and 2 tablets at bedtime on day 3. If symptoms persist, increase to 1 tablet in the morning, 1 tablet in the afternoon, and 2 tablets at bedtime daily.   famotidine 20  MG tablet Commonly known as:  PEPCID Take 20 mg by mouth daily as needed for heartburn.   insulin aspart 100 UNIT/ML FlexPen Commonly known as:  NOVOLOG Inject 7 Units into the skin 3 (three) times daily with meals.   labetalol 200 MG tablet Commonly known as:  NORMODYNE Take 2 tablets (400 mg total) by mouth 3 (three) times daily. What changed:    medication strength  how much to take  when to take this   metoCLOPramide 10 MG tablet Commonly known as:  REGLAN Take 1 tablet (10 mg total) by mouth every 6 (six) hours.   NIFEdipine 60 MG 24 hr tablet Commonly known as:  PROCARDIA XL/NIFEDICAL XL Take 60 mg by mouth 2 (two) times daily.   ondansetron 4 MG tablet Commonly known as:  ZOFRAN Take 4 mg by mouth every 8 (eight) hours as needed for nausea.   prochlorperazine 10 MG tablet Commonly known as:  COMPAZINE Take 1 tablet (10 mg  total) by mouth every 6 (six) hours as needed for nausea or vomiting.   promethazine 25 MG suppository Commonly known as:  PHENERGAN Place 1 suppository (25 mg total) rectally every 6 (six) hours as needed for nausea.       Diet: carb modified diet  Activity: Advance as tolerated. Pelvic rest for 6 weeks.   Outpatient follow up:2 weeks Follow up Appt:No future appointments. Follow up Visit:No follow-ups on file.    10/31/2017 Mora Bellman, MD

## 2017-10-31 NOTE — Telephone Encounter (Signed)
Called patient to give her an appointment. Left a voicemail for her to call us for detailed information.

## 2017-11-01 ENCOUNTER — Encounter (HOSPITAL_COMMUNITY): Payer: Self-pay | Admitting: *Deleted

## 2017-11-01 ENCOUNTER — Inpatient Hospital Stay (HOSPITAL_COMMUNITY)
Admission: AD | Admit: 2017-11-01 | Discharge: 2017-11-04 | DRG: 831 | Disposition: A | Discharge status: Home / Self Care | Payer: BC Managed Care – PPO | Attending: Obstetrics & Gynecology | Admitting: Obstetrics & Gynecology

## 2017-11-01 DIAGNOSIS — E872 Acidosis: Secondary | ICD-10-CM | POA: Diagnosis present

## 2017-11-01 DIAGNOSIS — O99340 Other mental disorders complicating pregnancy, unspecified trimester: Secondary | ICD-10-CM

## 2017-11-01 DIAGNOSIS — O211 Hyperemesis gravidarum with metabolic disturbance: Secondary | ICD-10-CM | POA: Diagnosis not present

## 2017-11-01 DIAGNOSIS — Z3A27 27 weeks gestation of pregnancy: Secondary | ICD-10-CM

## 2017-11-01 DIAGNOSIS — O169 Unspecified maternal hypertension, unspecified trimester: Secondary | ICD-10-CM | POA: Diagnosis present

## 2017-11-01 DIAGNOSIS — O219 Vomiting of pregnancy, unspecified: Secondary | ICD-10-CM

## 2017-11-01 DIAGNOSIS — O99342 Other mental disorders complicating pregnancy, second trimester: Secondary | ICD-10-CM | POA: Diagnosis present

## 2017-11-01 DIAGNOSIS — O24013 Pre-existing diabetes mellitus, type 1, in pregnancy, third trimester: Secondary | ICD-10-CM | POA: Diagnosis present

## 2017-11-01 DIAGNOSIS — Z794 Long term (current) use of insulin: Secondary | ICD-10-CM

## 2017-11-01 DIAGNOSIS — E11319 Type 2 diabetes mellitus with unspecified diabetic retinopathy without macular edema: Secondary | ICD-10-CM

## 2017-11-01 DIAGNOSIS — F32A Depression, unspecified: Secondary | ICD-10-CM | POA: Diagnosis present

## 2017-11-01 DIAGNOSIS — F329 Major depressive disorder, single episode, unspecified: Secondary | ICD-10-CM | POA: Diagnosis present

## 2017-11-01 DIAGNOSIS — O358XX0 Maternal care for other (suspected) fetal abnormality and damage, not applicable or unspecified: Secondary | ICD-10-CM | POA: Diagnosis present

## 2017-11-01 DIAGNOSIS — E109 Type 1 diabetes mellitus without complications: Secondary | ICD-10-CM | POA: Diagnosis present

## 2017-11-01 DIAGNOSIS — O0990 Supervision of high risk pregnancy, unspecified, unspecified trimester: Secondary | ICD-10-CM

## 2017-11-01 DIAGNOSIS — E1010 Type 1 diabetes mellitus with ketoacidosis without coma: Secondary | ICD-10-CM | POA: Diagnosis present

## 2017-11-01 DIAGNOSIS — Z7982 Long term (current) use of aspirin: Secondary | ICD-10-CM

## 2017-11-01 DIAGNOSIS — E1029 Type 1 diabetes mellitus with other diabetic kidney complication: Secondary | ICD-10-CM | POA: Diagnosis present

## 2017-11-01 DIAGNOSIS — O359XX0 Maternal care for (suspected) fetal abnormality and damage, unspecified, not applicable or unspecified: Secondary | ICD-10-CM

## 2017-11-01 DIAGNOSIS — E8729 Other acidosis: Secondary | ICD-10-CM

## 2017-11-01 DIAGNOSIS — R809 Proteinuria, unspecified: Secondary | ICD-10-CM

## 2017-11-01 DIAGNOSIS — O10919 Unspecified pre-existing hypertension complicating pregnancy, unspecified trimester: Secondary | ICD-10-CM | POA: Diagnosis present

## 2017-11-01 DIAGNOSIS — O10013 Pre-existing essential hypertension complicating pregnancy, third trimester: Secondary | ICD-10-CM | POA: Diagnosis present

## 2017-11-01 DIAGNOSIS — E1022 Type 1 diabetes mellitus with diabetic chronic kidney disease: Secondary | ICD-10-CM | POA: Diagnosis present

## 2017-11-01 LAB — COMPREHENSIVE METABOLIC PANEL
ALBUMIN: 1.8 g/dL — AB (ref 3.5–5.0)
ALK PHOS: 53 U/L (ref 38–126)
ALT: 16 U/L (ref 0–44)
ANION GAP: 14 (ref 5–15)
AST: 21 U/L (ref 15–41)
BILIRUBIN TOTAL: 0.5 mg/dL (ref 0.3–1.2)
BUN: 7 mg/dL (ref 6–20)
CALCIUM: 8.6 mg/dL — AB (ref 8.9–10.3)
CO2: 20 mmol/L — ABNORMAL LOW (ref 22–32)
Chloride: 98 mmol/L (ref 98–111)
Creatinine, Ser: 0.62 mg/dL (ref 0.44–1.00)
GFR calc Af Amer: >60 mL/min (ref >60)
Glucose, Bld: 129 mg/dL — ABNORMAL HIGH (ref 70–99)
POTASSIUM: 3.3 mmol/L — AB (ref 3.5–5.1)
Sodium: 132 mmol/L — ABNORMAL LOW (ref 135–145)
TOTAL PROTEIN: 6 g/dL — AB (ref 6.5–8.1)

## 2017-11-01 LAB — CBC
HCT: 33.7 % — ABNORMAL LOW (ref 36.0–46.0)
HEMOGLOBIN: 11.6 g/dL — AB (ref 12.0–15.0)
MCH: 28.4 pg (ref 26.0–34.0)
MCHC: 34.4 g/dL (ref 30.0–36.0)
MCV: 82.6 fL (ref 80.0–100.0)
Platelets: 467 10*3/uL — ABNORMAL HIGH (ref 150–400)
RBC: 4.08 MIL/uL (ref 3.87–5.11)
RDW: 12.5 % (ref 11.5–15.5)
WBC: 9.5 10*3/uL (ref 4.0–10.5)
nRBC: 0 % (ref 0.0–0.2)

## 2017-11-01 LAB — GLUCOSE, CAPILLARY: GLUCOSE-CAPILLARY: 127 mg/dL — AB (ref 70–99)

## 2017-11-01 MED ORDER — FAMOTIDINE IN NACL 20-0.9 MG/50ML-% IV SOLN
20.0000 mg | Freq: Once | INTRAVENOUS | Status: AC
  Administered 2017-11-01: 20 mg via INTRAVENOUS
  Filled 2017-11-01: qty 50

## 2017-11-01 MED ORDER — LACTATED RINGERS IV BOLUS
1000.0000 mL | Freq: Once | INTRAVENOUS | Status: AC
  Administered 2017-11-01: 1000 mL via INTRAVENOUS

## 2017-11-01 MED ORDER — ONDANSETRON 8 MG PO TBDP
8.0000 mg | ORAL_TABLET | Freq: Once | ORAL | Status: AC
  Administered 2017-11-01: 8 mg via ORAL
  Filled 2017-11-01: qty 1

## 2017-11-01 NOTE — MAU Note (Signed)
PT SAYS SHE VOMITS ALL PREG.    TOOK ALL MEDS TODAY- .  STARTED VOMITING MORE YESTERDAY.  [facility]   WITH [facility] - .

## 2017-11-01 NOTE — MAU Provider Note (Signed)
Chief Complaint:  Emesis and Hypertension   First Provider Initiated Contact with Patient 11/01/17 2233     HPI: Barbara Donovan is a 27 y.o. G1P0 at [redacted]w[redacted]d who presents to maternity admissions reporting n/v. Was discharged from hospital yesterday. HEG this pregnancy. Is T1DM & CHTN. Has had 4 hospital admissions since August for DKA, HEG, & CHTN. Unable to take antihypertensives d/t n/v. Currently goes to ob in  [facility] but in process of transferring to our clinic.  States, although she was better at time of discharge yesterday, she has had no relief at home. Has taken all of her antiemetics today (compazine, pepcid, phenergan, reglan, diclegis, & scop patch) without relief. Has vomited 7 times today prior to coming in and unable to keep down food/fluids/meds. Tried to take labetalol this morning but vomited immediately.  Denies headache, visual disturbance, epigastric pain. No abdominal pain, vaginal bleeding, or LOF. Positive fetal movement.    Pregnancy Course: HEG, CHTN, T1DM w/DKA  Past Medical History:  Diagnosis Date  . Diabetes ([place])    TYPE I  . Hypertension complicating pregnancy    OB History  Gravida Para Term Preterm AB Living  1            SAB TAB Ectopic Multiple Live Births               # Outcome Date GA Lbr Len/2nd Weight Sex Delivery Anes PTL Lv  1 Current            Past Surgical History:  Procedure Laterality Date  . WISDOM TOOTH EXTRACTION     Family History  Problem Relation Age of Onset  . Diabetes Mother   . Diabetes Father   . Cataracts Father   . Amblyopia Neg Hx   . Blindness Neg Hx   . Glaucoma Neg Hx   . Macular degeneration Neg Hx   . Retinal detachment Neg Hx   . Strabismus Neg Hx   . Retinitis pigmentosa Neg Hx    Social History   Tobacco Use  . Smoking status: Never Smoker  . Smokeless tobacco: Never Used  Substance Use Topics  . Alcohol use: Not Currently  . Drug use: Never   No Known Allergies Medications Prior to Admission   Medication Sig Dispense Refill Last Dose  . aspirin 81 MG chewable tablet Chew 81 mg by mouth daily.    Past Week at Unknown time  . Doxylamine-Pyridoxine 10-10 MG TBEC 2 tablets on an empty stomach at bedtime on day 1 and 2. If symptoms persist, take 1 tablet in the morning and 2 tablets at bedtime on day 3. If symptoms persist, increase to 1 tablet in the morning, 1 tablet in the afternoon, and 2 tablets at bedtime daily. 60 tablet 0 Past Week at Unknown time  . famotidine (PEPCID) 20 MG tablet Take 20 mg by mouth daily as needed for heartburn.    Past Week at Unknown time  . insulin aspart (NOVOLOG FLEXPEN) 100 UNIT/ML FlexPen Inject 7 Units into the skin 3 (three) times daily with meals. 30 pen 3   . Insulin Glargine (BASAGLAR KWIKPEN) 100 UNIT/ML SOPN Inject 0.28 mLs (28 Units total) into the skin at bedtime. 5 pen 0 10/29/2017 at Unknown time  . labetalol (NORMODYNE) 200 MG tablet Take 2 tablets (400 mg total) by mouth 3 (three) times daily. 180 tablet 3   . metoCLOPramide (REGLAN) 10 MG tablet Take 1 tablet (10 mg total) by mouth every 6 (six) hours. (  Patient not taking: Reported on 10/30/2017) 30 tablet 0 Not Taking at Unknown time  . NIFEdipine (PROCARDIA XL/NIFEDICAL XL) 60 MG 24 hr tablet Take 60 mg by mouth 2 (two) times daily.   10/28/2017 at Unknown time  . ondansetron (ZOFRAN) 4 MG tablet Take 4 mg by mouth every 8 (eight) hours as needed for nausea.    10/29/2017 at Unknown time  . prochlorperazine (COMPAZINE) 10 MG tablet Take 1 tablet (10 mg total) by mouth every 6 (six) hours as needed for nausea or vomiting. 30 tablet 0 Past Month at Unknown time  . promethazine (PHENERGAN) 25 MG suppository Place 1 suppository (25 mg total) rectally every 6 (six) hours as needed for nausea. 12 suppository 0 Past Week at Unknown time  . Pyridoxine HCl (B-6 PO) Take 1 tablet by mouth daily.   Past Week at Unknown time    I have reviewed patient's Past Medical Hx, Surgical Hx, Family Hx, Social Hx,  medications and allergies.   ROS:  Review of Systems  Constitutional: Negative.   Eyes: Negative for visual disturbance.  Respiratory: Negative for shortness of breath.   Cardiovascular: Positive for leg swelling. Negative for chest pain.  Gastrointestinal: Positive for nausea and vomiting. Negative for abdominal pain.  Genitourinary: Negative.   Neurological: Negative for headaches.    Physical Exam   Patient Vitals for the past 24 hrs:  BP Temp Temp src Pulse Resp Height Weight  11/02/17 0130 (!) 166/85 - - 92 - - -  11/02/17 0103 (!) 185/87 - - 78 - - -  11/02/17 0045 (!) 188/89 - - - - - -  11/02/17 0022 (!) 185/95 (!) 97.5 F (36.4 C) Oral (!) 102 - - -  11/02/17 0020 (!) 178/91 - - (!) 103 20 - -  11/01/17 2202 (!) 146/88 98.2 F (36.8 C) Oral (!) 117 (!) 22 5\' 6"  (1.676 m) 87.2 kg    Constitutional: Well-developed, well-nourished female in no acute distress.  Cardiovascular: normal rate & rhythm, no murmur Respiratory: normal effort, lung sounds clear throughout GI: Abd soft, non-tender, gravid appropriate for gestational age. Pos BS x 4 MS: 3 + pitting edema BLE. Extremities nontender, normal ROM Neurologic: Alert and oriented x 4. DTR 3+, 1 beat clonus  NST:  Baseline: 145 bpm, Variability: Good {> 6 bpm), Accelerations: Non-reactive but appropriate for gestational age, Decelerations: spontaneous and no contractions   Labs: Results for orders placed or performed during the hospital encounter of 11/01/17 (from the past 24 hour(s))  Glucose, capillary     Status: Abnormal   Collection Time: 11/01/17 10:49 PM  Result Value Ref Range   Glucose-Capillary 127 (H) 70 - 99 mg/dL  CBC     Status: Abnormal   Collection Time: 11/01/17 11:10 PM  Result Value Ref Range   WBC 9.5 4.0 - 10.5 K/uL   RBC 4.08 3.87 - 5.11 MIL/uL   Hemoglobin 11.6 (L) 12.0 - 15.0 g/dL   HCT 33.7 (L) 36.0 - 46.0 %   MCV 82.6 80.0 - 100.0 fL   MCH 28.4 26.0 - 34.0 pg   MCHC 34.4 30.0 - 36.0  g/dL   RDW 12.5 11.5 - 15.5 %   Platelets 467 (H) 150 - 400 K/uL   nRBC 0.0 0.0 - 0.2 %  Comprehensive metabolic panel     Status: Abnormal   Collection Time: 11/01/17 11:10 PM  Result Value Ref Range   Sodium 132 (L) 135 - 145 mmol/L   Potassium 3.3 (  L) 3.5 - 5.1 mmol/L   Chloride 98 98 - 111 mmol/L   CO2 20 (L) 22 - 32 mmol/L   Glucose, Bld 129 (H) 70 - 99 mg/dL   BUN 7 6 - 20 mg/dL   Creatinine, Ser 0.62 0.44 - 1.00 mg/dL   Calcium 8.6 (L) 8.9 - 10.3 mg/dL   Total Protein 6.0 (L) 6.5 - 8.1 g/dL   Albumin 1.8 (L) 3.5 - 5.0 g/dL   AST 21 15 - 41 U/L   ALT 16 0 - 44 U/L   Alkaline Phosphatase 53 38 - 126 U/L   Total Bilirubin 0.5 0.3 - 1.2 mg/dL   GFR calc non Af Amer >60 >60 mL/min   GFR calc Af Amer >60 >60 mL/min   Anion gap 14 5 - 15  Beta-hydroxybutyric acid     Status: Abnormal   Collection Time: 11/01/17 11:10 PM  Result Value Ref Range   Beta-Hydroxybutyric Acid 1.61 (H) 0.05 - 0.27 mmol/L    Imaging:  No results found.  MAU Course: Orders Placed This Encounter  Procedures  . Urinalysis, Routine w reflex microscopic  . CBC  . Comprehensive metabolic panel  . Beta-hydroxybutyric acid  . Glucose, capillary  . Vital signs  . Notify Physician  . Fetal monitoring  . Continuous tocometry  . Measure blood pressure   Meds ordered this encounter  Medications  . lactated ringers bolus 1,000 mL  . famotidine (PEPCID) IVPB 20 mg premix  . ondansetron (ZOFRAN-ODT) disintegrating tablet 8 mg  . multivitamins adult (MVI -12) 10 mL in lactated ringers 1,000 mL infusion  . DISCONTD: diphenhydrAMINE (BENADRYL) injection 50 mg  . metoCLOPramide (REGLAN) injection 10 mg  . AND Linked Order Group   . hydrALAZINE (APRESOLINE) injection 5 mg   . hydrALAZINE (APRESOLINE) injection 10 mg   . labetalol (NORMODYNE,TRANDATE) injection 20 mg   . labetalol (NORMODYNE,TRANDATE) injection 40 mg    MDM: Pt vomiting on arrival. IV fluids started. Zofran 8 mg & pepcid 20 mg. Pt  reports no improvement with zofran. Reglan 10 mg ordered.  CBC, CMP, beta hydroxybutyric acid labs pending Initial BP stable. Repeat BPs severe range. IV antihypertensive protocol ordered. Pt given hydralazine 5 mg, 10mg , labetalol 20mg , 40mg ....Marland Kitchenremain severe range Pt reports some relief after reglan  Assessment: 1. Increased anion gap metabolic acidosis   2. Pregnancy affected by multiple congenital anomalies of fetus, single or unspecified fetus   3. Nausea and vomiting in pregnancy     Plan: Admit to antenatal unit per c/w Dr. Fredia Sorrow, Junie Panning, NP 11/02/2017 1:50 AM

## 2017-11-01 NOTE — MAU Note (Signed)
WAS ADMITTED SAT-  [place]

## 2017-11-02 ENCOUNTER — Other Ambulatory Visit: Payer: Self-pay

## 2017-11-02 DIAGNOSIS — O169 Unspecified maternal hypertension, unspecified trimester: Secondary | ICD-10-CM | POA: Diagnosis present

## 2017-11-02 DIAGNOSIS — O10912 Unspecified pre-existing hypertension complicating pregnancy, second trimester: Secondary | ICD-10-CM | POA: Diagnosis not present

## 2017-11-02 DIAGNOSIS — O358XX0 Maternal care for other (suspected) fetal abnormality and damage, not applicable or unspecified: Secondary | ICD-10-CM | POA: Diagnosis present

## 2017-11-02 DIAGNOSIS — Z3A37 37 weeks gestation of pregnancy: Secondary | ICD-10-CM

## 2017-11-02 DIAGNOSIS — E1010 Type 1 diabetes mellitus with ketoacidosis without coma: Secondary | ICD-10-CM | POA: Diagnosis present

## 2017-11-02 DIAGNOSIS — O99342 Other mental disorders complicating pregnancy, second trimester: Secondary | ICD-10-CM | POA: Diagnosis present

## 2017-11-02 DIAGNOSIS — R809 Proteinuria, unspecified: Secondary | ICD-10-CM

## 2017-11-02 DIAGNOSIS — O10913 Unspecified pre-existing hypertension complicating pregnancy, third trimester: Secondary | ICD-10-CM | POA: Diagnosis not present

## 2017-11-02 DIAGNOSIS — E109 Type 1 diabetes mellitus without complications: Secondary | ICD-10-CM | POA: Diagnosis not present

## 2017-11-02 DIAGNOSIS — O10013 Pre-existing essential hypertension complicating pregnancy, third trimester: Secondary | ICD-10-CM | POA: Diagnosis present

## 2017-11-02 DIAGNOSIS — O211 Hyperemesis gravidarum with metabolic disturbance: Secondary | ICD-10-CM | POA: Diagnosis present

## 2017-11-02 DIAGNOSIS — Z794 Long term (current) use of insulin: Secondary | ICD-10-CM | POA: Diagnosis not present

## 2017-11-02 DIAGNOSIS — O24013 Pre-existing diabetes mellitus, type 1, in pregnancy, third trimester: Secondary | ICD-10-CM | POA: Diagnosis present

## 2017-11-02 DIAGNOSIS — F329 Major depressive disorder, single episode, unspecified: Secondary | ICD-10-CM | POA: Diagnosis present

## 2017-11-02 DIAGNOSIS — E1029 Type 1 diabetes mellitus with other diabetic kidney complication: Secondary | ICD-10-CM | POA: Diagnosis present

## 2017-11-02 DIAGNOSIS — Z7982 Long term (current) use of aspirin: Secondary | ICD-10-CM | POA: Diagnosis not present

## 2017-11-02 DIAGNOSIS — O212 Late vomiting of pregnancy: Secondary | ICD-10-CM | POA: Diagnosis not present

## 2017-11-02 DIAGNOSIS — Z3A27 27 weeks gestation of pregnancy: Secondary | ICD-10-CM | POA: Diagnosis not present

## 2017-11-02 HISTORY — DX: Proteinuria, unspecified: R80.9

## 2017-11-02 LAB — COMPREHENSIVE METABOLIC PANEL
ALBUMIN: 1.4 g/dL — AB (ref 3.5–5.0)
ALT: 13 U/L (ref 0–44)
AST: 32 U/L (ref 15–41)
Alkaline Phosphatase: 40 U/L (ref 38–126)
Anion gap: 11 (ref 5–15)
BUN: 7 mg/dL (ref 6–20)
CO2: 18 mmol/L — ABNORMAL LOW (ref 22–32)
CREATININE: 0.54 mg/dL (ref 0.44–1.00)
Calcium: 7.6 mg/dL — ABNORMAL LOW (ref 8.9–10.3)
Chloride: 103 mmol/L (ref 98–111)
GFR calc Af Amer: >60 mL/min (ref >60)
GLUCOSE: 116 mg/dL — AB (ref 70–99)
Potassium: 3.5 mmol/L (ref 3.5–5.1)
Sodium: 132 mmol/L — ABNORMAL LOW (ref 135–145)
Total Bilirubin: 1 mg/dL (ref 0.3–1.2)
Total Protein: 5.1 g/dL — ABNORMAL LOW (ref 6.5–8.1)

## 2017-11-02 LAB — CBC
HCT: 29.2 % — ABNORMAL LOW (ref 36.0–46.0)
HEMOGLOBIN: 9.2 g/dL — AB (ref 12.0–15.0)
MCH: 25.8 pg — AB (ref 26.0–34.0)
MCHC: 31.5 g/dL (ref 30.0–36.0)
MCV: 81.8 fL (ref 80.0–100.0)
Platelets: 431 10*3/uL — ABNORMAL HIGH (ref 150–400)
RBC: 3.57 MIL/uL — ABNORMAL LOW (ref 3.87–5.11)
RDW: 12.5 % (ref 11.5–15.5)
WBC: 7.7 10*3/uL (ref 4.0–10.5)
nRBC: 0 % (ref 0.0–0.2)

## 2017-11-02 LAB — GLUCOSE, CAPILLARY
GLUCOSE-CAPILLARY: 111 mg/dL — AB (ref 70–99)
GLUCOSE-CAPILLARY: 78 mg/dL (ref 70–99)
GLUCOSE-CAPILLARY: 104 mg/dL — AB (ref 70–99)
GLUCOSE-CAPILLARY: 38 mg/dL — AB (ref 70–99)
GLUCOSE-CAPILLARY: 78 mg/dL (ref 70–99)
GLUCOSE-CAPILLARY: 129 mg/dL — AB (ref 70–99)

## 2017-11-02 LAB — URINALYSIS, ROUTINE W REFLEX MICROSCOPIC
BACTERIA UA: NONE SEEN
Bilirubin Urine: NEGATIVE
Glucose, UA: 50 mg/dL — AB
HGB URINE DIPSTICK: NEGATIVE
Ketones, ur: 80 mg/dL — AB
Leukocytes, UA: NEGATIVE
Nitrite: NEGATIVE
SPECIFIC GRAVITY, URINE: 1.02 (ref 1.005–1.030)
pH: 6 (ref 5.0–8.0)

## 2017-11-02 LAB — BETA-HYDROXYBUTYRIC ACID: Beta-Hydroxybutyric Acid: 1.61 mmol/L — ABNORMAL HIGH (ref 0.05–0.27)

## 2017-11-02 MED ORDER — LABETALOL HCL 200 MG PO TABS: 400.0000 mg | ORAL_TABLET | Freq: Three times a day (TID) | ORAL | Status: DC

## 2017-11-02 MED ORDER — LABETALOL HCL 5 MG/ML IV SOLN: 40.0000 mg | INTRAVENOUS | Status: DC | PRN

## 2017-11-02 MED ORDER — SODIUM CHLORIDE 0.9 % IV SOLN
25.0000 mg | INTRAVENOUS | Status: DC
  Administered 2017-11-02 – 2017-11-04 (×7): 25 mg via INTRAVENOUS
  Filled 2017-11-02 (×6): qty 1

## 2017-11-02 MED ORDER — METOCLOPRAMIDE HCL 5 MG/ML IJ SOLN
10.0000 mg | Freq: Four times a day (QID) | INTRAMUSCULAR | Status: DC | PRN
  Administered 2017-11-02: 10 mg via INTRAVENOUS
  Filled 2017-11-02: qty 2

## 2017-11-02 MED ORDER — CALCIUM CARBONATE ANTACID 500 MG PO CHEW: 2.0000 | CHEWABLE_TABLET | ORAL | Status: DC | PRN

## 2017-11-02 MED ORDER — ONDANSETRON HCL 4 MG/2ML IJ SOLN
4.0000 mg | Freq: Four times a day (QID) | INTRAMUSCULAR | Status: DC | PRN
  Administered 2017-11-02 (×2): 4 mg via INTRAVENOUS
  Filled 2017-11-02 (×2): qty 2

## 2017-11-02 MED ORDER — M.V.I. ADULT IV INJ
Freq: Once | INTRAVENOUS | Status: AC
  Administered 2017-11-02: 03:00:00 via INTRAVENOUS
  Filled 2017-11-02: qty 10

## 2017-11-02 MED ORDER — INSULIN GLARGINE 100 UNIT/ML ~~LOC~~ SOLN
28.0000 [IU] | Freq: Every day | SUBCUTANEOUS | Status: DC
  Administered 2017-11-02 – 2017-11-03 (×3): 28 [IU] via SUBCUTANEOUS
  Filled 2017-11-02 (×4): qty 0.28

## 2017-11-02 MED ORDER — LABETALOL HCL 5 MG/ML IV SOLN
20.0000 mg | INTRAVENOUS | Status: DC | PRN
  Administered 2017-11-02: 20 mg via INTRAVENOUS
  Filled 2017-11-02: qty 4

## 2017-11-02 MED ORDER — DOCUSATE SODIUM 100 MG PO CAPS
100.0000 mg | ORAL_CAPSULE | Freq: Every day | ORAL | Status: DC
  Administered 2017-11-03: 100 mg via ORAL
  Filled 2017-11-02: qty 1

## 2017-11-02 MED ORDER — PROCHLORPERAZINE EDISYLATE 10 MG/2ML IJ SOLN
10.0000 mg | Freq: Four times a day (QID) | INTRAMUSCULAR | Status: DC | PRN
  Administered 2017-11-02: 10 mg via INTRAVENOUS
  Filled 2017-11-02 (×3): qty 2

## 2017-11-02 MED ORDER — INSULIN ASPART 100 UNIT/ML ~~LOC~~ SOLN
7.0000 [IU] | Freq: Three times a day (TID) | SUBCUTANEOUS | Status: DC
  Administered 2017-11-02 – 2017-11-04 (×5): 7 [IU] via SUBCUTANEOUS

## 2017-11-02 MED ORDER — INSULIN ASPART 100 UNIT/ML ~~LOC~~ SOLN
0.0000 [IU] | Freq: Four times a day (QID) | SUBCUTANEOUS | Status: DC | PRN
  Administered 2017-11-03 (×2): 2 [IU] via SUBCUTANEOUS
  Filled 2017-11-02 (×2): qty 0.16

## 2017-11-02 MED ORDER — HYDRALAZINE HCL 20 MG/ML IJ SOLN: 10.0000 mg | INTRAMUSCULAR | Status: DC | PRN

## 2017-11-02 MED ORDER — LABETALOL HCL 5 MG/ML IV SOLN: 20.0000 mg | INTRAVENOUS | Status: DC | PRN

## 2017-11-02 MED ORDER — ACETAMINOPHEN 325 MG PO TABS
650.0000 mg | ORAL_TABLET | ORAL | Status: DC | PRN
  Administered 2017-11-02 – 2017-11-04 (×2): 650 mg via ORAL
  Filled 2017-11-02 (×2): qty 2

## 2017-11-02 MED ORDER — FAMOTIDINE IN NACL 20-0.9 MG/50ML-% IV SOLN
20.0000 mg | Freq: Two times a day (BID) | INTRAVENOUS | Status: DC
  Administered 2017-11-02 – 2017-11-03 (×5): 20 mg via INTRAVENOUS
  Filled 2017-11-02 (×6): qty 50

## 2017-11-02 MED ORDER — METOCLOPRAMIDE HCL 5 MG/ML IJ SOLN
10.0000 mg | Freq: Once | INTRAMUSCULAR | Status: AC
  Administered 2017-11-02: 10 mg via INTRAVENOUS
  Filled 2017-11-02: qty 2

## 2017-11-02 MED ORDER — ZOLPIDEM TARTRATE 5 MG PO TABS: 5.0000 mg | ORAL_TABLET | Freq: Every evening | ORAL | Status: DC | PRN

## 2017-11-02 MED ORDER — LABETALOL HCL 200 MG PO TABS
600.0000 mg | ORAL_TABLET | Freq: Three times a day (TID) | ORAL | Status: DC
  Administered 2017-11-02 – 2017-11-04 (×7): 600 mg via ORAL
  Filled 2017-11-02: qty 3
  Filled 2017-11-02 (×3): qty 6
  Filled 2017-11-02 (×3): qty 3

## 2017-11-02 MED ORDER — PRENATAL MULTIVITAMIN CH
1.0000 | ORAL_TABLET | Freq: Every day | ORAL | Status: DC
  Filled 2017-11-02: qty 1

## 2017-11-02 MED ORDER — NIFEDIPINE ER OSMOTIC RELEASE 30 MG PO TB24
60.0000 mg | ORAL_TABLET | Freq: Two times a day (BID) | ORAL | Status: DC
  Administered 2017-11-02 – 2017-11-04 (×6): 60 mg via ORAL
  Filled 2017-11-02 (×6): qty 2

## 2017-11-02 MED ORDER — M.V.I. ADULT IV INJ
Freq: Once | INTRAVENOUS | Status: AC
  Administered 2017-11-02: via INTRAVENOUS
  Filled 2017-11-02: qty 10

## 2017-11-02 MED ORDER — SCOPOLAMINE 1 MG/3DAYS TD PT72
1.0000 | MEDICATED_PATCH | TRANSDERMAL | Status: DC
  Administered 2017-11-02: 1.5 mg via TRANSDERMAL
  Filled 2017-11-02 (×2): qty 1

## 2017-11-02 MED ORDER — ASPIRIN 81 MG PO CHEW
81.0000 mg | CHEWABLE_TABLET | Freq: Every day | ORAL | Status: DC
  Administered 2017-11-02 – 2017-11-04 (×3): 81 mg via ORAL
  Filled 2017-11-02 (×4): qty 1

## 2017-11-02 MED ORDER — HYDRALAZINE HCL 20 MG/ML IJ SOLN
5.0000 mg | INTRAMUSCULAR | Status: DC | PRN
  Administered 2017-11-02: 5 mg via INTRAVENOUS
  Filled 2017-11-02: qty 1

## 2017-11-02 MED ORDER — LORAZEPAM 2 MG/ML IJ SOLN
1.0000 mg | Freq: Four times a day (QID) | INTRAMUSCULAR | Status: DC | PRN
  Filled 2017-11-02: qty 0.5

## 2017-11-02 MED ORDER — DIPHENHYDRAMINE HCL 50 MG/ML IJ SOLN: 50.0000 mg | Freq: Once | INTRAMUSCULAR | Status: DC

## 2017-11-02 MED ORDER — ENOXAPARIN SODIUM 40 MG/0.4ML ~~LOC~~ SOLN
40.0000 mg | SUBCUTANEOUS | Status: DC
  Administered 2017-11-02 – 2017-11-03 (×2): 40 mg via SUBCUTANEOUS
  Filled 2017-11-02 (×2): qty 0.4

## 2017-11-02 MED ORDER — HYDRALAZINE HCL 20 MG/ML IJ SOLN: 5.0000 mg | INTRAMUSCULAR | Status: DC | PRN

## 2017-11-02 MED ORDER — HYDRALAZINE HCL 20 MG/ML IJ SOLN
10.0000 mg | INTRAMUSCULAR | Status: DC | PRN
  Administered 2017-11-02: 10 mg via INTRAVENOUS
  Filled 2017-11-02: qty 1

## 2017-11-02 MED ORDER — LABETALOL HCL 5 MG/ML IV SOLN
40.0000 mg | INTRAVENOUS | Status: DC | PRN
  Administered 2017-11-02: 40 mg via INTRAVENOUS
  Filled 2017-11-02: qty 8

## 2017-11-02 MED ORDER — PYRIDOXINE HCL 100 MG/ML IJ SOLN
100.0000 mg | Freq: Every day | INTRAMUSCULAR | Status: DC
  Administered 2017-11-02 – 2017-11-04 (×3): 100 mg via INTRAVENOUS
  Filled 2017-11-02 (×4): qty 1

## 2017-11-02 NOTE — H&P (Signed)
Chief Complaint:  Emesis and Hypertension   First Provider Initiated Contact with Patient 11/01/17 2233     HPI: Barbara Donovan is a 27 y.o. G1P0 at [redacted]w[redacted]d who presents to maternity admissions reporting n/v. Was discharged from hospital on 10/31/2017. HEG this pregnancy. Is T1DM & CHTN. Has had 4 hospital admissions since August for DKA, HEG, & CHTN. Unable to take antihypertensives d/t n/v. Currently gets [facility] in [place] but in process of transferring to our clinic.  States, although she was better at time of discharge yesterday, she has had no relief at home. Has taken all of her antiemetics today (compazine, pepcid, phenergan, reglan, diclegis, & scop patch) without relief. Has vomited 7 times today prior to coming in and unable to keep down food/fluids/meds. Tried to take labetalol this morning but vomited immediately. Denies headache, visual disturbance, epigastric pain. No abdominal pain, vaginal bleeding, or LOF. Positive fetal movement.    Pregnancy Course: HEG, CHTN, T1DM w/DKA      Past Medical History:  Diagnosis Date  . Diabetes ([place])    TYPE I  . Hypertension complicating pregnancy                    OB History  Gravida Para Term Preterm AB Living  1            SAB TAB Ectopic Multiple Live Births                     # Outcome Date GA Lbr Len/2nd Weight Sex Delivery Anes PTL Lv  1 Current                 Past Surgical History:  Procedure Laterality Date  . WISDOM TOOTH EXTRACTION          Family History  Problem Relation Age of Onset  . Diabetes Mother   . Diabetes Father   . Cataracts Father   . Amblyopia Neg Hx   . Blindness Neg Hx   . Glaucoma Neg Hx   . Macular degeneration Neg Hx   . Retinal detachment Neg Hx   . Strabismus Neg Hx   . Retinitis pigmentosa Neg Hx    Social History       Tobacco Use  . Smoking status: Never Smoker  . Smokeless tobacco: Never Used  Substance Use Topics  .  Alcohol use: Not Currently  . Drug use: Never   No Known Allergies        Medications Prior to Admission  Medication Sig Dispense Refill Last Dose  . aspirin 81 MG chewable tablet Chew 81 mg by mouth daily.    Past Week at Unknown time  . Doxylamine-Pyridoxine 10-10 MG TBEC 2 tablets on an empty stomach at bedtime on day 1 and 2. If symptoms persist, take 1 tablet in the morning and 2 tablets at bedtime on day 3. If symptoms persist, increase to 1 tablet in the morning, 1 tablet in the afternoon, and 2 tablets at bedtime daily. 60 tablet 0 Past Week at Unknown time  . famotidine (PEPCID) 20 MG tablet Take 20 mg by mouth daily as needed for heartburn.    Past Week at Unknown time  . insulin aspart (NOVOLOG FLEXPEN) 100 UNIT/ML FlexPen Inject 7 Units into the skin 3 (three) times daily with meals. 30 pen 3   . Insulin Glargine (BASAGLAR KWIKPEN) 100 UNIT/ML SOPN Inject 0.28 mLs (28 Units total) into the skin at  bedtime. 5 pen 0 10/29/2017 at Unknown time  . labetalol (NORMODYNE) 200 MG tablet Take 2 tablets (400 mg total) by mouth 3 (three) times daily. 180 tablet 3   . metoCLOPramide (REGLAN) 10 MG tablet Take 1 tablet (10 mg total) by mouth every 6 (six) hours. (Patient not taking: Reported on 10/30/2017) 30 tablet 0 Not Taking at Unknown time  . NIFEdipine (PROCARDIA XL/NIFEDICAL XL) 60 MG 24 hr tablet Take 60 mg by mouth 2 (two) times daily.   10/28/2017 at Unknown time  . ondansetron (ZOFRAN) 4 MG tablet Take 4 mg by mouth every 8 (eight) hours as needed for nausea.    10/29/2017 at Unknown time  . prochlorperazine (COMPAZINE) 10 MG tablet Take 1 tablet (10 mg total) by mouth every 6 (six) hours as needed for nausea or vomiting. 30 tablet 0 Past Month at Unknown time  . promethazine (PHENERGAN) 25 MG suppository Place 1 suppository (25 mg total) rectally every 6 (six) hours as needed for nausea. 12 suppository 0 Past Week at Unknown time  . Pyridoxine HCl (B-6 PO) Take 1 tablet by  mouth daily.   Past Week at Unknown time    I have reviewed patient's Past Medical Hx, Surgical Hx, Family Hx, Social Hx, medications and allergies.   ROS:  Review of Systems  Constitutional: Negative.   Eyes: Negative for visual disturbance.  Respiratory: Negative for shortness of breath.   Cardiovascular: Positive for leg swelling. Negative for chest pain.  Gastrointestinal: Positive for nausea and vomiting. Negative for abdominal pain.  Genitourinary: Negative.   Neurological: Negative for headaches.    Physical Exam   Patient Vitals for the past 24 hrs:  BP Temp Temp src Pulse Resp Height Weight  11/02/17 0130 (!) 166/85 - - 92 - - -  11/02/17 0103 (!) 185/87 - - 78 - - -  11/02/17 0045 (!) 188/89 - - - - - -  11/02/17 0022 (!) 185/95 (!) 97.5 F (36.4 C) Oral (!) 102 - - -  11/02/17 0020 (!) 178/91 - - (!) 103 20 - -  11/01/17 2202 (!) 146/88 98.2 F (36.8 C) Oral (!) 117 (!) 22 5\' 6"  (1.676 m) 87.2 kg    Constitutional: Well-developed, well-nourished female in no acute distress.  Cardiovascular: normal rate & rhythm, no murmur Respiratory: normal effort, lung sounds clear throughout GI: Abd soft, non-tender, gravid appropriate for gestational age. Pos BS x 4 MS: 3 + pitting edema BLE. Extremities nontender, normal ROM Neurologic: Alert and oriented x 4. DTR 3+, 1 beat clonus  NST:  Baseline: 145 bpm, Variability: Good {> 6 bpm), Accelerations: Non-reactive but appropriate for gestational age, Decelerations: spontaneous and no contractions   Labs: LabResultsLast24Hours       Results for orders placed or performed during the hospital encounter of 11/01/17 (from the past 24 hour(s))  Glucose, capillary     Status: Abnormal   Collection Time: 11/01/17 10:49 PM  Result Value Ref Range   Glucose-Capillary 127 (H) 70 - 99 mg/dL  CBC     Status: Abnormal   Collection Time: 11/01/17 11:10 PM  Result Value Ref Range   WBC 9.5 4.0 - 10.5 K/uL   RBC  4.08 3.87 - 5.11 MIL/uL   Hemoglobin 11.6 (L) 12.0 - 15.0 g/dL   HCT 33.7 (L) 36.0 - 46.0 %   MCV 82.6 80.0 - 100.0 fL   MCH 28.4 26.0 - 34.0 pg   MCHC 34.4 30.0 - 36.0 g/dL   RDW  12.5 11.5 - 15.5 %   Platelets 467 (H) 150 - 400 K/uL   nRBC 0.0 0.0 - 0.2 %  Comprehensive metabolic panel     Status: Abnormal   Collection Time: 11/01/17 11:10 PM  Result Value Ref Range   Sodium 132 (L) 135 - 145 mmol/L   Potassium 3.3 (L) 3.5 - 5.1 mmol/L   Chloride 98 98 - 111 mmol/L   CO2 20 (L) 22 - 32 mmol/L   Glucose, Bld 129 (H) 70 - 99 mg/dL   BUN 7 6 - 20 mg/dL   Creatinine, Ser 0.62 0.44 - 1.00 mg/dL   Calcium 8.6 (L) 8.9 - 10.3 mg/dL   Total Protein 6.0 (L) 6.5 - 8.1 g/dL   Albumin 1.8 (L) 3.5 - 5.0 g/dL   AST 21 15 - 41 U/L   ALT 16 0 - 44 U/L   Alkaline Phosphatase 53 38 - 126 U/L   Total Bilirubin 0.5 0.3 - 1.2 mg/dL   GFR calc non Af Amer >60 >60 mL/min   GFR calc Af Amer >60 >60 mL/min   Anion gap 14 5 - 15  Beta-hydroxybutyric acid     Status: Abnormal   Collection Time: 11/01/17 11:10 PM  Result Value Ref Range   Beta-Hydroxybutyric Acid 1.61 (H) 0.05 - 0.27 mmol/L      Imaging:  No results found.  MAU Course: Orders Placed This Encounter  Procedures  . Urinalysis, Routine w reflex microscopic  . CBC  . Comprehensive metabolic panel  . Beta-hydroxybutyric acid  . Glucose, capillary  . Vital signs  . Notify Physician  . Fetal monitoring  . Continuous tocometry  . Measure blood pressure       Meds ordered this encounter  Medications  . lactated ringers bolus 1,000 mL  . famotidine (PEPCID) IVPB 20 mg premix  . ondansetron (ZOFRAN-ODT) disintegrating tablet 8 mg  . multivitamins adult (MVI -12) 10 mL in lactated ringers 1,000 mL infusion  . DISCONTD: diphenhydrAMINE (BENADRYL) injection 50 mg  . metoCLOPramide (REGLAN) injection 10 mg  . AND Linked Order Group   . hydrALAZINE (APRESOLINE) injection 5 mg   . hydrALAZINE  (APRESOLINE) injection 10 mg   . labetalol (NORMODYNE,TRANDATE) injection 20 mg   . labetalol (NORMODYNE,TRANDATE) injection 40 mg    MDM: Pt vomiting on arrival. IV fluids started. Zofran 8 mg & pepcid 20 mg. Pt reports no improvement with zofran. Reglan 10 mg ordered.  CBC, CMP, beta hydroxybutyric acid labs pending Initial BP stable. Repeat BPs severe range. IV antihypertensive protocol ordered. 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 Fredia Sorrow, Junie Panning, NP 11/02/2017 1:50 AM    Attestation of Attending Supervision of Advanced Practice Provider (PA/CNM/NP): Evaluation and management procedures were performed by the Advanced Practice Provider under my supervision and collaboration.  I have reviewed the Advanced Practice Provider's note and chart, and I agree with the management and plan.    Patient is s/p recent admission for HEG.  BP in triage were initially  severe range, requiring IV treatment, however the patient did not have recent BP meds due to inability to tolerate PO over the last day. She denied headaches, vision changes, SOB, and RUQ pain. She had negative preeclampsia evaluation labs.  Patient Vitals for the past 24 hrs:  BP Temp Temp src Pulse Resp SpO2 Height Weight  11/02/17 0225 (!) 146/85 98.7 F (37.1 C) - 95 17 97 % - -  11/02/17 0204 - - - - - 99 % - -  11/02/17 0200 (!) 157/89 - - 95 - - - -  11/02/17 0159 - - - - - 99 % - -   11/02/17 0154 - - - - - 99 % - -  11/02/17 0150 (!) 149/89 - - 96 - - - -  11/02/17 0149 - - - - - 99 % - -  11/02/17 0144 - - - - - 99 % - -  11/02/17 0139 - - - - - 100 % - -  11/02/17 0134 - - - - - 100 % - -  11/02/17 0130 (!) 166/85 - - 92 - - - -  11/02/17 0129 - - - - - 100 % - -  11/02/17 0124 - - - - - 100 % - -  11/02/17 0119 - - - - - 100 % - -  11/02/17 0114 - - - - - 100 % - -  11/02/17 0109 - - - - - 100 % - -  11/02/17 0104 - - - - - 100 % - -  11/02/17 0103 (!) 185/87 - - 78 - - - -  11/02/17 0059 - - - - - 100 % - -  11/02/17 0054 - - - - - 100 % - -  11/02/17 0049 - - - - - 100 % - -  11/02/17 0045 (!) 188/89 - - - - - - -  11/02/17 0044 - - - - - 100 % - -  11/02/17 0039 - - - - - 100 % - -  11/02/17 0029 - - - - - 100 % - -  11/02/17 0022 (!) 185/95 (!) 97.5 F (36.4 C) Oral (!) 102 - - - -  11/02/17 0020 (!) 178/91 - - (!) 103 20 - - -  11/01/17 2202 (!) 146/88 98.2 F (36.8 C) Oral (!) 117 (!) 22 - 5\' 6"  (1.676 m) 87.2 kg    CBC Latest Ref Rng & Units 11/01/2017 10/29/2017 10/29/2017  WBC 4.0 - 10.5 K/uL 9.5 6.8 7.9  Hemoglobin 12.0 - 15.0 g/dL 11.6(L) 10.8(L) 12.4  Hematocrit 36.0 - 46.0 % 33.7(L) 31.2(L) 36.0  Platelets 150 - 400 K/uL 467(H) 389 420(H)   CMP Latest Ref Rng & Units 11/01/2017 10/30/2017 10/29/2017  Glucose 70 - 99 mg/dL 129(H) 146(H) -  BUN 6 - 20 mg/dL 7 9 -  Creatinine 0.44 - 1.00 mg/dL 0.62 0.59 0.63  Sodium 135 - 145 mmol/L 132(L) 133(L) -  Potassium 3.5 - 5.1 mmol/L 3.3(L) 3.7 -  Chloride 98 - 111 mmol/L 98 104 -  CO2 22 - 32 mmol/L 20(L) 23 -  Calcium 8.9 - 10.3 mg/dL 8.6(L) 7.8(L) -  Total Protein 6.5 - 8.1 g/dL 6.0(L) 4.6(L) -  Total Bilirubin 0.3 - 1.2 mg/dL 0.5 0.4 -  Alkaline Phos 38 - 126 U/L 53 37(L) -  AST 15 - 41 U/L 21 15 -  ALT 0 - 44 U/L 16 12 -    She was admitted for management  of nausea and vomiting, and further observation and management of her BP. Her Type I DM is stable for now, will restart home  insulin regimen. NPO and IV fluids for now.  Activity as tolerated.  Noted to have a few decelerations in triage but now has reactive NST; will check NST every shift and tocometry as needed.  Routine antenatal care.   Verita Schneiders, MD, [place] for [employer], [place]

## 2017-11-02 NOTE — Progress Notes (Signed)
Patient ID: Barbara Donovan, female   DOB: October 07, 1990, 27 y.o.   MRN: [id number] [place] ANTEPARTUM PROGRESS NOTE  Barbara Donovan is a 27 y.o. G1P0 at [redacted]w[redacted]d who is re-admitted for N/V in pregnancy, severe hypertension, anion gap acidosis.  Estimated Date of Delivery: 01/25/18 Fetal presentation is unsure.  Length of Stay:  0 Days. Admitted 11/01/2017  Subjective: Patient reports normal fetal movement.  She denies uterine contractions, denies bleeding and leaking of fluid per vagina. She reports emesis overnight since her admission Vitals:  Blood pressure (!) 143/85, pulse (!) 101, temperature 99.3 F (37.4 C), temperature source Oral, resp. rate 18, height 5\' 6"  (1.676 m), weight 87.2 kg, last menstrual period 04/02/2017, SpO2 100 %. Physical Examination: CONSTITUTIONAL: Well-developed, well-nourished female in no acute distress.  CARDIOVASCULAR: Normal heart rate noted, regular rhythm RESPIRATORY: Effort and breath sounds normal, no problems with respiration noted MUSCULOSKELETAL: Normal range of motion. No edema and no tenderness. ABDOMEN: Soft, nontender, gravid. CERVIX: deferred  Fetal monitoring: FHR: reactive NST- baseline 145, mod variability, +accels, no decels Uterine activity: no contractions per hour   No results found.  Current scheduled medications . aspirin  81 mg Oral Daily  . docusate sodium  100 mg Oral Daily  . enoxaparin (LOVENOX) injection  40 mg Subcutaneous Q24H  . insulin aspart  7 Units Subcutaneous TID WC  . insulin glargine  28 Units Subcutaneous QHS  . labetalol  600 mg Oral TID  . NIFEdipine  60 mg Oral BID  . prenatal multivitamin  1 tablet Oral Q1200  . scopolamine  1 patch Transdermal Q72H    I have reviewed the patient's current medications.  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

## 2017-11-02 NOTE — Progress Notes (Signed)
Inpatient Diabetes Program Recommendations  Diabetes Treatment Program Recommendations  ADA Standards of Care 2019 Diabetes in Pregnancy Target Glucose Ranges:  Fasting: 60 - 90 mg/dL Preprandial: 60 - 105 mg/dL 1 hr postprandial: Less than 140mg /dL (from first bite of meal) 2 hr postprandial: Less than 120 mg/dL (from first bite of meal)   Results for Barbara Donovan, Barbara Donovan (MRN [id number]) as of 11/02/2017 11:22  Ref. Range 11/01/2017 22:49 11/02/2017 05:31  Glucose-Capillary Latest Ref Range: 70 - 99 mg/dL 127 (H) 111 (H)    Review of Glycemic Control  Diabetes history: DM1 (makes NO insulin; requires basal, correction, and meal coverage insulin) Outpatient Diabetes medications: Basaglar 28 units QHS, Novolog 7 units TID with meals Current orders for Inpatient glycemic control: Lantus 28 units QHS, Novolog 7 units TID with meals, Novolog 0-16 units Q6H PRN  Inpatient Diabetes Program Recommendations:  Correction (SSI): Do not recommend Novolog correction scale be PRN as it may be overlooked. For example, glucose 111 mg/dl this morning but no Novolog correction given. Please consider changing frequency of Novolog 0-16 units to QID (fasting and 2 hour post prandial).   NOTE: Per chart review, patient has DM1 history and sees Dr. Loanne Drilling (Endocrinologist). Per chart review, patient last seen Dr. Loanne Drilling on 08/17/17. Noted patient was inpatient 10/29/17 to 10/31/17 with same issues (N/V). Patient is [redacted]W[redacted]D and fasting glucose 111 mg/dl this morning. Recommend having Novolog correction scale scheduled QID (fasting and 2 hour post prandial).  Thanks, Barnie Alderman, RN, MSN, CDE Diabetes Coordinator Inpatient Diabetes Program [phone] (Team Pager from 8am to 5pm)

## 2017-11-03 LAB — GLUCOSE, CAPILLARY
GLUCOSE-CAPILLARY: 96 mg/dL (ref 70–99)
Glucose-Capillary: 91 mg/dL (ref 70–99)
Glucose-Capillary: 93 mg/dL (ref 70–99)
Glucose-Capillary: 104 mg/dL — ABNORMAL HIGH (ref 70–99)

## 2017-11-03 NOTE — Progress Notes (Signed)
Initial Nutrition Assessment  DOCUMENTATION CODES:  Not applicable  INTERVENTION:  CHO modified gestational diabetic diet/ double protein portions Discussed basic diet modification to reduce nausea  NUTRITION DIAGNOSIS:  Increased nutrient needs related to (pregnancy and etal growth requirements) as evidenced by (28 weeks IUP).  GOAL:  Patient will meet greater than or equal to 90% of their needs, Weight gain  MONITOR:  Weight trends  REASON FOR ASSESSMENT:  Gestational Diabetes, Antenatal   ASSESSMENT:  28 1/7 weeks adm with n/v, Hx of DMI, CHTN. weight at 9 weeks: 174 lbs, BMI 28.3. 18 lb weight gain to date. Pt reports improved tol of diet today. Will change diet order to CHO mod GDM diet to closer mimic home diet. Reports Hx of freq episodes of n/v early in preg, but had gone 1 month without unmanageable n/v recently  Diet Order:   Diet Order            Diet gestational carb mod Fluid consistency: Thin; Room service appropriate? Yes  Diet effective now             EDUCATION NEEDS:   No education needs have been identified at this time Endocrine follow-up Dr Loanne Drilling  Skin:  Skin Assessment: Reviewed RN Assessment  Height:   Ht Readings from Last 1 Encounters:  11/01/17 5\' 6"  (1.676 m)    Weight:   Wt Readings from Last 1 Encounters:  11/01/17 87.2 kg    Ideal Body Weight:   130 lbs  BMI:  Body mass index is 31.03 kg/m.  Estimated Nutritional Needs:   Kcal:  2000-2200  Protein:  90-100 g  Fluid:  2.3 L    Weyman Rodney M.Fredderick Severance LDN Neonatal Nutrition Support Specialist/RD III Pager [phone]      Phone [phone]

## 2017-11-03 NOTE — Progress Notes (Signed)
Inpatient Diabetes Program Recommendations  AACE/ADA: New Consensus Statement on Inpatient Glycemic Control (2015)  Target Ranges:  Prepandial:   less than 140 mg/dL      Peak postprandial:   less than 180 mg/dL (1-2 hours)      Critically ill patients:  140 - 180 mg/dL   Lab Results  Component Value Date   GLUCAP 91 11/03/2017   HGBA1C 6.6 (H) 10/29/2017    Review of Glycemic Control  Results for SUDIE, BANDEL (MRN [id number]) as of 11/03/2017 08:43  Ref. Range 11/02/2017 15:43 11/02/2017 16:02 11/02/2017 16:21 11/02/2017 17:59 11/03/2017 06:13  Glucose-Capillary Latest Ref Range: 70 - 99 mg/dL 38 (LL) 78 104 (H) 129 (H) 91    Diabetes history: DM1 (makes NO insulin; requires basal, correction, and meal coverage insulin) Outpatient Diabetes medications: Basaglar 28 units QHS, Novolog 7 units TID with meals Current orders for Inpatient glycemic control: Lantus 28 units QHS, Novolog 7 units TID with meals, Novolog 0-16 units Q6H PRN  Inpatient Diabetes Program Recommendations:   Noted severe hypoglycemia of 38 mg/dL on 10/16. Patient was NPO. Also, it was noted that patient has eaten a meal since then. This AM BS okay, will continue to follow.   Correction: Please consider changing frequency of Novolog 0-14 units to QID (fasting and 2 hour post prandial).  Thanks, Bronson Curb, MSN, RNC-OB Diabetes Coordinator [phone] (8a-5p)

## 2017-11-04 DIAGNOSIS — O10913 Unspecified pre-existing hypertension complicating pregnancy, third trimester: Secondary | ICD-10-CM

## 2017-11-04 DIAGNOSIS — O10013 Pre-existing essential hypertension complicating pregnancy, third trimester: Secondary | ICD-10-CM

## 2017-11-04 DIAGNOSIS — E109 Type 1 diabetes mellitus without complications: Secondary | ICD-10-CM

## 2017-11-04 LAB — GLUCOSE, CAPILLARY
GLUCOSE-CAPILLARY: 67 mg/dL — AB (ref 70–99)
Glucose-Capillary: 94 mg/dL (ref 70–99)

## 2017-11-04 MED ORDER — PROMETHAZINE HCL 25 MG PO TABS: 25.0000 mg | ORAL_TABLET | Freq: Four times a day (QID) | ORAL | Status: DC | PRN

## 2017-11-04 MED ORDER — FAMOTIDINE 20 MG PO TABS
20.0000 mg | ORAL_TABLET | Freq: Two times a day (BID) | ORAL | Status: DC
  Administered 2017-11-04: 20 mg via ORAL
  Filled 2017-11-04: qty 1

## 2017-11-04 NOTE — Progress Notes (Signed)
The patient is receiving famotidine by the intravenous route.  Based on criteria approved by the Pharmacy and [place], the medication is being converted to the equivalent oral dose form.  These criteria include: -No Active GI bleeding -Able to tolerate diet of full liquids (or better) or tube feeding -Able to tolerate other medications by the oral or enteral route  If you have any questions about this conversion, please contact the Pharmacy Department (ext [phone]).  Thank you.

## 2017-11-04 NOTE — Discharge Summary (Addendum)
OB Discharge Summary     Patient Name: Barbara Donovan DOB: 12-11-1990 MRN: [id number]  Date of admission: 11/01/2017 Delivering MD: This patient has no babies on file.  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 She received IV fluid and antiemetics on HD#2 and 3 she tolerated a regular diet. Fetal status remained reassuring. Discharge precautions were reviewed with the patient. Patient plans to transfer care at [facility] and has an appointment on 10/28 at 1:35pm  Physical exam  Vitals:   11/03/17 2355 11/04/17 0613 11/04/17 0744 11/04/17 1206  BP: (!) 148/87 (!) 148/89 (!) 141/89 (!) 146/87  Pulse: 86 90 98 85  Resp: 16 16 18 18   Temp: 97.9 F (36.6 C) 98.2 F (36.8 C) 98 F (36.7 C) (!) 97.5 F (36.4 C)  TempSrc: Oral Oral Oral Oral  SpO2: 99% 98% 99% 99%  Weight:      Height:       General: alert, cooperative and no distress Abd: soft, gravid, NT DVT Evaluation: No evidence of DVT seen on physical exam.  FHT: baseline 135, mod variability, +accels, no decels Toco: no contractions   Labs: Lab Results  Component Value Date   WBC 7.7 11/02/2017   HGB 9.2 (L) 11/02/2017   HCT 29.2 (L) 11/02/2017   MCV 81.8 11/02/2017   PLT 431 (H) 11/02/2017   CMP Latest Ref Rng & Units 11/02/2017  Glucose 70 - 99  mg/dL 116(H)  BUN 6 - 20 mg/dL 7  Creatinine 0.44 - 1.00 mg/dL 0.54  Sodium 135 - 145 mmol/L 132(L)  Potassium 3.5 - 5.1 mmol/L 3.5  Chloride 98 - 111 mmol/L 103  CO2 22 - 32 mmol/L 18(L)  Calcium 8.9 - 10.3 mg/dL 7.6(L)  Total Protein 6.5 - 8.1 g/dL 5.1(L)  Total Bilirubin 0.3 - 1.2 mg/dL 1.0  Alkaline Phos 38 - 126 U/L 40  AST 15 - 41 U/L 32  ALT 0 - 44 U/L 13    Discharge instruction: per After Visit Summary and "Baby and Me Booklet".  After visit meds:  Allergies as of 11/04/2017   No Known Allergies     Medication List    TAKE these medications   aspirin 81 MG chewable tablet Chew 81 mg by mouth daily.   B-6 PO Take 1 tablet by mouth daily.   BASAGLAR KWIKPEN 100 UNIT/ML Sopn Inject 0.28 mLs (28 Units total) into the skin at bedtime.   famotidine 20 MG tablet Commonly known as:  PEPCID Take 20 mg by mouth daily as needed for heartburn.   insulin aspart 100 UNIT/ML FlexPen Commonly known as:  NOVOLOG Inject 7 Units into the skin 3 (three) times daily with meals.   labetalol 200 MG tablet Commonly  known as:  NORMODYNE Take 2 tablets (400 mg total) by mouth 3 (three) times daily.   NIFEdipine 60 MG 24 hr tablet Commonly known as:  PROCARDIA XL/NIFEDICAL XL Take 60 mg by mouth 2 (two) times daily.   ondansetron 4 MG tablet Commonly known as:  ZOFRAN Take 4 mg by mouth every 8 (eight) hours as needed for nausea.   prochlorperazine 10 MG tablet Commonly known as:  COMPAZINE Take 1 tablet (10 mg total) by mouth every 6 (six) hours as needed for nausea or vomiting.   promethazine 25 MG suppository Commonly known as:  PHENERGAN Place 1 suppository (25 mg total) rectally every 6 (six) hours as needed for nausea.       Diet: carb modified diet  Activity: Advance as tolerated.   Outpatient follow up: Follow up Appt: Future Appointments  Date Time Provider [place]  11/14/2017  1:35 PM Aura Camps, MD [facility]   Follow up Visit:No  follow-ups on file.  11/04/2017 Mora Bellman, MD

## 2017-11-04 NOTE — Discharge Instructions (Signed)
Eating Plan for Hyperemesis Gravidarum Hyperemesis gravidarum is a severe form of morning sickness. Because this condition causes severe nausea and vomiting, it can lead to dehydration, malnutrition, and weight loss. One way to lessen the symptoms of nausea and vomiting is to follow the eating plan for hyperemesis gravidarum. It is often used along with prescribed medicines to control your symptoms. What can I do to relieve my symptoms? Listen to your body. Everyone is different and has different preferences. Find what works best for you. Take any of the following actions that are helpful to you:  Eat and drink slowly.  Eat 5-6 small meals daily instead of 3 large meals.  Eat crackers before you get out of bed in the morning.  Try having a snack in the middle of the night.  Starchy foods are usually tolerated well. Examples include cereal, toast, bread, potatoes, pasta, rice, and pretzels.  Ginger may help with nausea. Add  tsp ground ginger to hot tea or choose ginger tea.  Try drinking 100% fruit juice or an electrolyte drink. An electrolyte drink contains sodium, potassium, and chloride.  Continue to take your prenatal vitamins as told by your health care provider. If you are having trouble taking your prenatal vitamins, talk with your health care provider about different options.  Include at least 1 serving of protein with your meals and snacks. Protein options include meats or poultry, beans, nuts, eggs, and yogurt. Try eating a protein-rich snack before bed. Examples of these snacks include cheese and crackers or half of a peanut butter or [place] sandwich.  Consider eliminating foods that trigger your symptoms. These may include spicy foods, coffee, high-fat foods, very sweet foods, and acidic foods.  Try meals that have more protein combined with bland, salty, lower-fat, and dry foods, such as nuts, seeds, pretzels, crackers, and cereal.  Talk with your healthcare provider about  starting a supplement of vitamin B6.  Have fluids that are cold, clear, and carbonated or sour. Examples include lemonade, ginger ale, lemon-lime soda, ice water, and sparkling water.  Try lemon or mint tea.  Try brushing your teeth or using a mouth rinse after meals.  What should I avoid to reduce my symptoms? Avoiding some of the following things may help reduce your symptoms.  Foods with strong smells. Try eating meals in well-ventilated areas that are free of odors.  Drinking water or other beverages with meals. Try not to drink anything during the 30 minutes before and after your meals.  Drinking more than 1 cup of fluid at a time. Sometimes using a straw helps.  Fried or high-fat foods, such as butter and cream sauces.  Spicy foods.  Skipping meals as best as you can. Nausea can be more intense on an empty stomach. If you cannot tolerate food at that time, do not force it. Try sucking on ice chips or other frozen items, and make up for missed calories later.  Lying down within 2 hours after eating.  Environmental triggers. These may include smoky rooms, closed spaces, rooms with strong smells, warm or humid places, overly loud and noisy rooms, and rooms with motion or flickering lights.  Quick and sudden changes in your movement.  This information is not intended to replace advice given to you by your health care provider. Make sure you discuss any questions you have with your health care provider. Document Released: 11/01/2006 Document Revised: 09/03/2015 Document Reviewed: 08/05/2015 Elsevier Interactive Patient Education  [employer].

## 2017-11-04 NOTE — Progress Notes (Signed)
Patient ID: Barbara Donovan, female   DOB: [birth year], 27 y.o.   MRN: [id number] [place] ANTEPARTUM PROGRESS NOTE  Barbara Donovan is a 27 y.o. G1P0 at [redacted]w[redacted]d who is re-admitted for N/V in pregnancy, severe hypertension, anion gap acidosis.  Estimated Date of Delivery: 01/25/18 Fetal presentation is unsure.  Length of Stay:  2 Days. Admitted 11/01/2017  Subjective: Patient reports feeling better. She tolerated a regular diet without emesis. She reports normal fetal movement.  She denies uterine contractions, denies bleeding and leaking of fluid per vagina.  Vitals:  Blood pressure (!) 141/89, pulse 98, temperature 98 F (36.7 C), temperature source Oral, resp. rate 18, height 5\' 6"  (1.676 m), weight 87.2 kg, last menstrual period 04/02/2017, SpO2 99 %. Physical Examination: CONSTITUTIONAL: Well-developed, well-nourished female in no acute distress.  CARDIOVASCULAR: Normal heart rate noted, regular rhythm RESPIRATORY: Effort and breath sounds normal, no problems with respiration noted ABDOMEN: Soft, nontender, gravid. CERVIX: deferred  Fetal monitoring: FHR: reactive NST- baseline 140, mod variability, +accels, no decels Uterine activity: no contractions per hour   No results found.  Current scheduled medications . aspirin  81 mg Oral Daily  . docusate sodium  100 mg Oral Daily  . enoxaparin (LOVENOX) injection  40 mg Subcutaneous Q24H  . famotidine  20 mg Oral BID  . insulin aspart  7 Units Subcutaneous TID WC  . insulin glargine  28 Units Subcutaneous QHS  . labetalol  600 mg Oral TID  . NIFEdipine  60 mg Oral BID  . prenatal multivitamin  1 tablet Oral Q1200  . pyridOXINE  100 mg Intravenous Daily  . scopolamine  1 patch Transdermal Q72H    I have reviewed the patient's current medications.  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

## 2017-11-14 ENCOUNTER — Ambulatory Visit (INDEPENDENT_AMBULATORY_CARE_PROVIDER_SITE_OTHER): Payer: BC Managed Care – PPO | Admitting: Family Medicine

## 2017-11-14 ENCOUNTER — Encounter: Payer: Self-pay | Admitting: Family Medicine

## 2017-11-14 ENCOUNTER — Telehealth: Payer: Self-pay

## 2017-11-14 VITALS — BP 111/70 | HR 92 | Wt 199.9 lb

## 2017-11-14 DIAGNOSIS — O0990 Supervision of high risk pregnancy, unspecified, unspecified trimester: Secondary | ICD-10-CM

## 2017-11-14 DIAGNOSIS — Z113 Encounter for screening for infections with a predominantly sexual mode of transmission: Secondary | ICD-10-CM

## 2017-11-14 DIAGNOSIS — Z23 Encounter for immunization: Secondary | ICD-10-CM

## 2017-11-14 DIAGNOSIS — O1213 Gestational proteinuria, third trimester: Secondary | ICD-10-CM

## 2017-11-14 DIAGNOSIS — O10919 Unspecified pre-existing hypertension complicating pregnancy, unspecified trimester: Secondary | ICD-10-CM

## 2017-11-14 DIAGNOSIS — O24019 Pre-existing diabetes mellitus, type 1, in pregnancy, unspecified trimester: Secondary | ICD-10-CM

## 2017-11-14 DIAGNOSIS — Z124 Encounter for screening for malignant neoplasm of cervix: Secondary | ICD-10-CM | POA: Diagnosis not present

## 2017-11-14 LAB — POCT URINALYSIS DIP (DEVICE)
BILIRUBIN URINE: NEGATIVE
GLUCOSE, UA: 100 mg/dL — AB
Ketones, ur: NEGATIVE mg/dL
Leukocytes, UA: NEGATIVE
NITRITE: NEGATIVE
Protein, ur: >=300 mg/dL — AB
Specific Gravity, Urine: >=1.03 (ref 1.005–1.030)
Urobilinogen, UA: 1 mg/dL (ref 0.0–1.0)
pH: 6.5 (ref 5.0–8.0)

## 2017-11-14 MED ORDER — BLOOD PRESSURE CUFF MISC: 1.0000 | Freq: Every day | 0 refills | Status: DC

## 2017-11-14 NOTE — Progress Notes (Signed)
Subjective:   Barbara Donovan is a 27 y.o. G1P0 at 69w5dby early ultrasound being seen today for her first obstetrical visit.  Her obstetrical history is significant for type 1 diabetes, chronic hypertension, proteinuria (unclear if related to diabetes or if developing preE), fetus with multiple anomalies. Patient does intend to breast feed. Pregnancy history fully reviewed.  Patient reports no complaints.  [facility]. Now that she knows, she wants to transfer her care here. She was admitted for nausea and vomiting. Discharged on 10/18. She reports feeling better since hospital discharge. She's taking vitamin B6 and unisom for nausea. Also using scopolamine patches. States that phenergan and reglan don't work for her.  She's feeling well at this time.    HISTORY: OB History  Gravida Para Term Preterm AB Living  1 0 0 0 0 0  SAB TAB Ectopic Multiple Live Births  0 0 0 0 0    # Outcome Date GA Lbr Len/2nd Weight Sex Delivery Anes PTL Lv  1 Current            Patient can't remember when Last pap smear and was normal Past Medical History:  Diagnosis Date  . [place]    TYPE I  . Hypertension complicating pregnancy    Past Surgical History:  Procedure Laterality Date  . NO PAST SURGERIES    . WISDOM TOOTH EXTRACTION     Family History  Problem Relation Age of Onset  . Diabetes Mother   . Diabetes Father   . Cataracts Father   . Amblyopia Neg Hx   . Blindness Neg Hx   . Glaucoma Neg Hx   . Macular degeneration Neg Hx   . Retinal detachment Neg Hx   . Strabismus Neg Hx   . Retinitis pigmentosa Neg Hx    Social History   Tobacco Use  . Smoking status: Never Smoker  . Smokeless tobacco: Never Used  Substance Use Topics  . Alcohol use: Not Currently  . Drug use: Never   No Known Allergies Current Outpatient Medications on File Prior to Visit  Medication Sig Dispense Refill  . aspirin 81 MG  chewable tablet Chew 81 mg by mouth daily.     . famotidine (PEPCID) 20 MG tablet Take 20 mg by mouth daily as needed for heartburn.     . insulin aspart (NOVOLOG FLEXPEN) 100 UNIT/ML FlexPen Inject 7 Units into the skin 3 (three) times daily with meals. 30 pen 3  . Insulin Glargine (BASAGLAR KWIKPEN) 100 UNIT/ML SOPN Inject 0.28 mLs (28 Units total) into the skin at bedtime. 5 pen 0  . labetalol (NORMODYNE) 200 MG tablet Take 2 tablets (400 mg total) by mouth 3 (three) times daily. 180 tablet 3  . NIFEdipine (PROCARDIA XL/NIFEDICAL XL) 60 MG 24 hr tablet Take 60 mg by mouth 2 (two) times daily.    . ondansetron (ZOFRAN) 4 MG tablet Take 4 mg by mouth every 8 (eight) hours as needed for nausea.     . Pyridoxine HCl (B-6 PO) Take 1 tablet by mouth daily.    . prochlorperazine (COMPAZINE) 10 MG tablet Take 1 tablet (10 mg total) by mouth every 6 (six) hours as needed for nausea or vomiting. (Patient not taking: Reported on 11/14/2017) 30 tablet 0  . promethazine (PHENERGAN) 25 MG suppository Place 1 suppository (25 mg total) rectally every 6 (six) hours as needed for nausea. (  Patient not taking: Reported on 11/14/2017) 12 suppository 0   No current facility-administered medications on file prior to visit.      Exam   Vitals:   11/14/17 1342  BP: 111/70  Pulse: 92  Weight: 199 lb 14.4 oz (90.7 kg)   Fetal Heart Rate (bpm): 143  Uterus:  Fundal Height: 28 cm  Pelvic Exam: Perineum: no hemorrhoids, normal perineum   Vulva: normal external genitalia, no lesions   Vagina:  normal mucosa, normal discharge   Cervix: no lesions and normal, pap smear done.    Adnexa: normal adnexa and no mass, fullness, tenderness   Bony Pelvis: average  System: General: well-developed, well-nourished female in no acute distress   Breast:  normal appearance, no masses or tenderness   Skin: normal coloration and turgor, no rashes   Neurologic: oriented, normal, negative, normal mood   Extremities: normal  strength, tone, and muscle mass, ROM of all joints is normal   HEENT PERRLA, extraocular movement intact and sclera clear, anicteric   Mouth/Teeth mucous membranes moist, pharynx normal without lesions and dental hygiene good   Neck supple and no masses   Cardiovascular: regular rate and rhythm   Respiratory:  no respiratory distress, normal breath sounds   Abdomen: soft, non-tender; bowel sounds normal; no masses,  no organomegaly     Assessment:   Pregnancy: G1P0 Patient Active Problem List   Diagnosis Date Noted  . 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 Pregnancy complicated by multiple fetal congenital anomalies 10/30/2017  . Severe hyperemesis gravidarum 10/30/2017  . Depression during pregnancy, antepartum 07/07/2017  . Supervision of high risk pregnancy, antepartum 07/07/2017  . Type 1 diabetes mellitus affecting pregnancy, antepartum 07/07/2017  . Chronic hypertension affecting pregnancy 07/07/2017  . Diabetes ([place]) 06/09/2017     Plan:  1. Supervision of high risk pregnancy, antepartum - Cytology - PAP - Protein / creatinine ratio, urine - [name] MFM OB DETAIL +14 WK; Future - Comp Met (CMET) - Referral to MFM   2. Chronic hypertension affecting pregnancy - Continue procardia and labetalol  - Prescribed BP cuff today  - Discussed preE precautions - Comp Met (CMET)/UPC repeated today   3. Type 1 diabetes mellitus affecting pregnancy, antepartum - Current regimen lantus 28 units daily and lispro 7 units TIDAC - Comp Met (CMET)  - Monitor CBGs and bring log to next visit   Initial labs reviewed (care everywhere) Continue prenatal vitamins. Genetic Screening discussed, patient had genetic testing; will ask pt to sign release for records at next visit Ultrasound discussed; fetal anatomic survey: results reviewed. Problem list reviewed and updated. The nature of [place] with multiple MDs and other Advanced  Practice Providers was explained to patient; also emphasized that residents, students are part of our team. Routine obstetric precautions reviewed. Return in about 2 weeks (around 11/28/2017) for HROB.

## 2017-11-14 NOTE — Telephone Encounter (Signed)
Called Pt. per Provider, Dr Aura Camps request for Pt to bring Blood Sugar Log at next visit. Pt verbalized understanding.

## 2017-11-15 LAB — COMPREHENSIVE METABOLIC PANEL
A/G RATIO: 1 — AB (ref 1.2–2.2)
ALBUMIN: 2.6 g/dL — AB (ref 3.5–5.5)
ALT: 13 IU/L (ref 0–32)
AST: 15 IU/L (ref 0–40)
Alkaline Phosphatase: 55 IU/L (ref 39–117)
BUN/Creatinine Ratio: 17 (ref 9–23)
BUN: 8 mg/dL (ref 6–20)
CALCIUM: 8.1 mg/dL — AB (ref 8.7–10.2)
CO2: 19 mmol/L — ABNORMAL LOW (ref 20–29)
Chloride: 101 mmol/L (ref 96–106)
Creatinine, Ser: 0.48 mg/dL — ABNORMAL LOW (ref 0.57–1.00)
GFR calc Af Amer: 155 mL/min/{1.73_m2} (ref >59)
GFR, EST NON AFRICAN AMERICAN: 135 mL/min/{1.73_m2} (ref >59)
GLOBULIN, TOTAL: 2.5 g/dL (ref 1.5–4.5)
Glucose: 63 mg/dL — ABNORMAL LOW (ref 65–99)
Potassium: 3.9 mmol/L (ref 3.5–5.2)
SODIUM: 137 mmol/L (ref 134–144)
TOTAL PROTEIN: 5.1 g/dL — AB (ref 6.0–8.5)

## 2017-11-15 LAB — PROTEIN / CREATININE RATIO, URINE
Creatinine, Urine: 132.9 mg/dL
Protein, Ur: 1196.3 mg/dL
Protein/Creat Ratio: 9002 mg/g creat — ABNORMAL HIGH (ref 0–200)

## 2017-11-17 LAB — CYTOLOGY - PAP
CHLAMYDIA, DNA PROBE: NEGATIVE
Diagnosis: NEGATIVE
NEISSERIA GONORRHEA: NEGATIVE

## 2017-11-22 ENCOUNTER — Encounter (HOSPITAL_COMMUNITY): Payer: Self-pay

## 2017-11-22 ENCOUNTER — Ambulatory Visit (HOSPITAL_COMMUNITY)
Admission: RE | Admit: 2017-11-22 | Discharge: 2017-11-22 | Disposition: A | Discharge status: Home / Self Care | Payer: BC Managed Care – PPO | Source: Ambulatory Visit | Attending: Family Medicine | Admitting: Family Medicine

## 2017-11-22 DIAGNOSIS — O10013 Pre-existing essential hypertension complicating pregnancy, third trimester: Secondary | ICD-10-CM | POA: Diagnosis not present

## 2017-11-22 DIAGNOSIS — O24013 Pre-existing diabetes mellitus, type 1, in pregnancy, third trimester: Secondary | ICD-10-CM

## 2017-11-22 DIAGNOSIS — Z3689 Encounter for other specified antenatal screening: Secondary | ICD-10-CM | POA: Insufficient documentation

## 2017-11-22 DIAGNOSIS — O0990 Supervision of high risk pregnancy, unspecified, unspecified trimester: Secondary | ICD-10-CM

## 2017-11-22 DIAGNOSIS — O359XX0 Maternal care for (suspected) fetal abnormality and damage, unspecified, not applicable or unspecified: Secondary | ICD-10-CM

## 2017-11-22 DIAGNOSIS — E109 Type 1 diabetes mellitus without complications: Secondary | ICD-10-CM | POA: Insufficient documentation

## 2017-11-22 DIAGNOSIS — Z3A30 30 weeks gestation of pregnancy: Secondary | ICD-10-CM | POA: Diagnosis not present

## 2017-11-22 DIAGNOSIS — O358XX0 Maternal care for other (suspected) fetal abnormality and damage, not applicable or unspecified: Secondary | ICD-10-CM | POA: Insufficient documentation

## 2017-11-22 IMAGING — US US MFM OB DETAIL+14 WK
1 series · 13 of 28 positions shown · non-contrast
Comparison: none

[Series 1: us mfm ob detail+14 wk · 61 acquisitions, 13 frames shown]
[im 3/61]
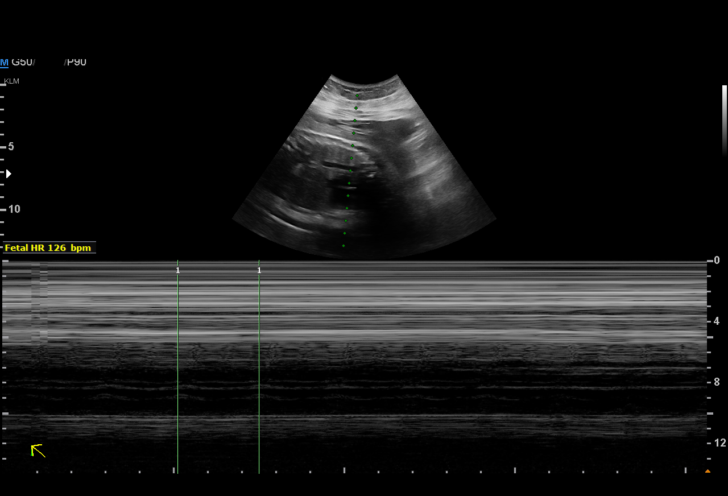
[im 7/61]
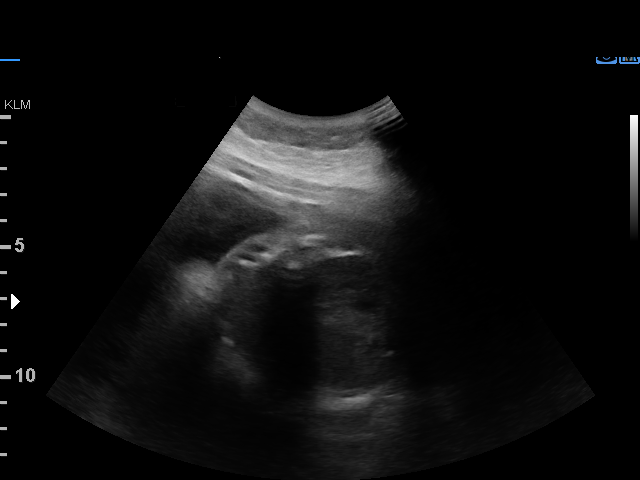
[im 12/61]
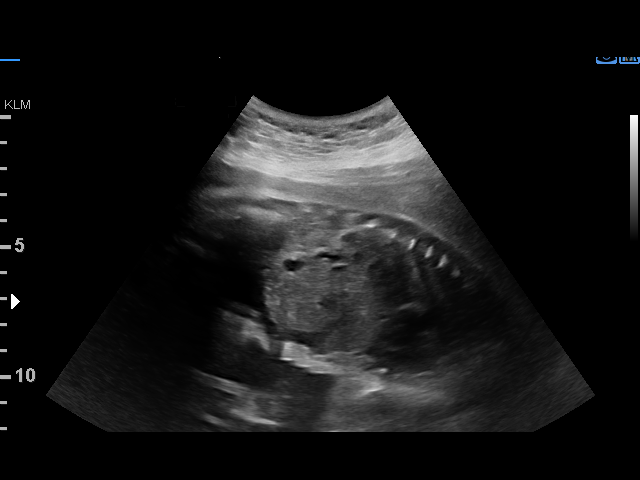
[im 16/61]
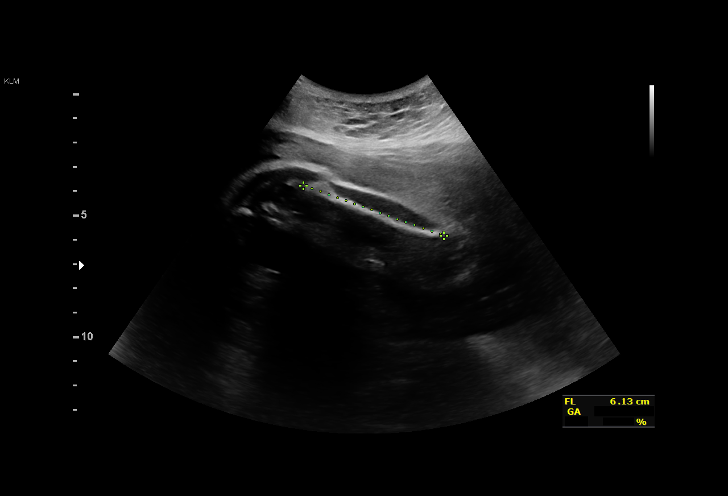
[im 21/61]
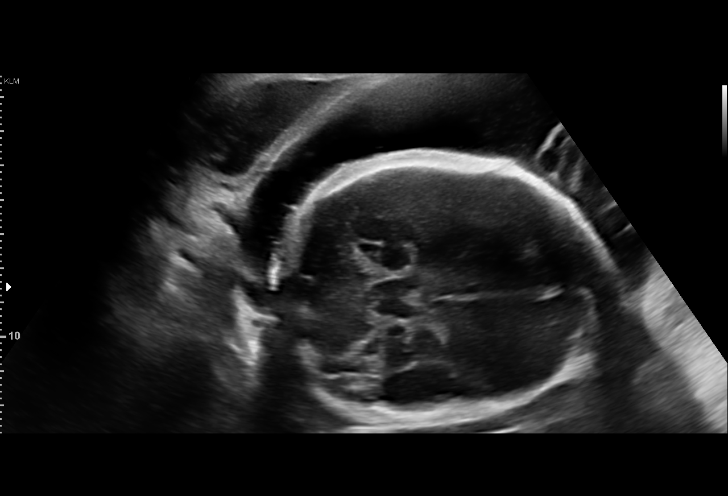
[im 25/61]
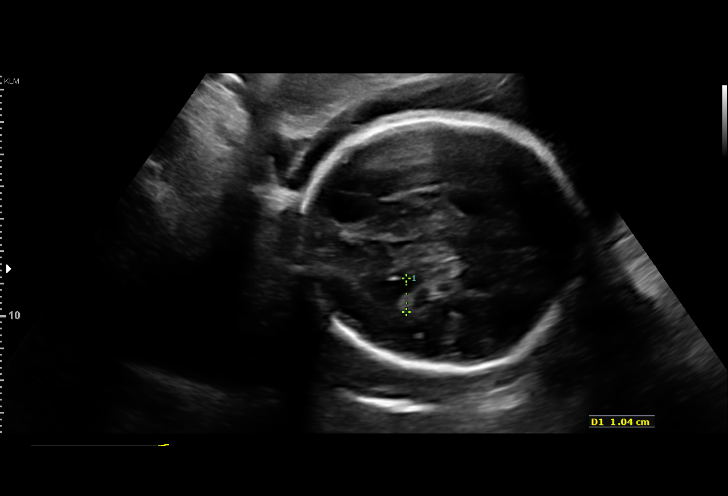
[im 32/61]
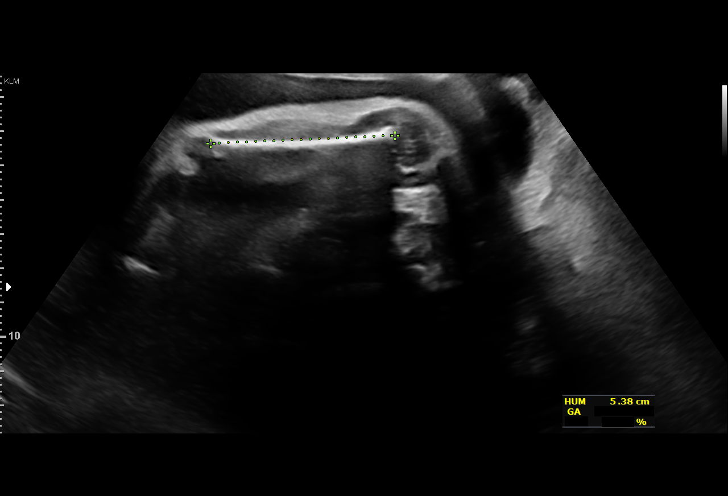
[im 36/61]
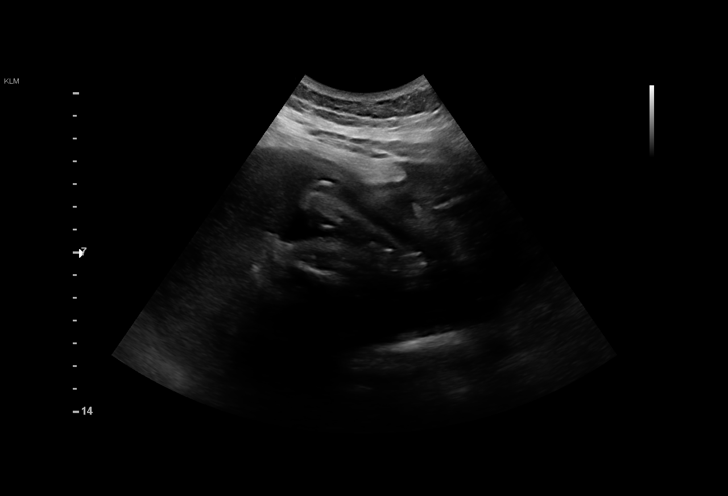
[im 41/61]
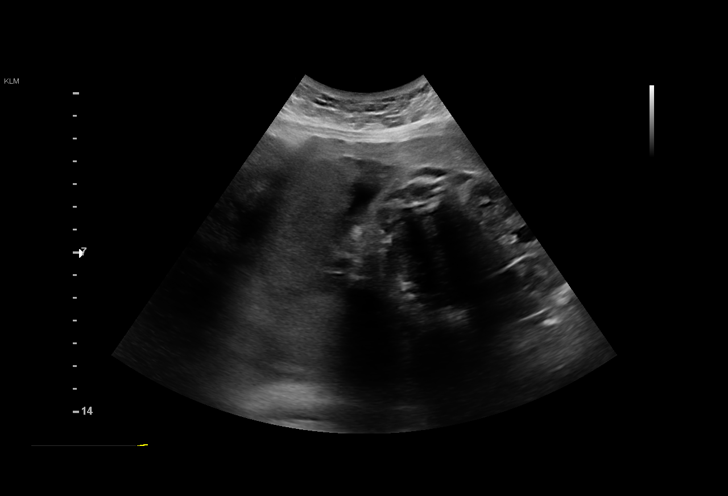
[im 45/61]
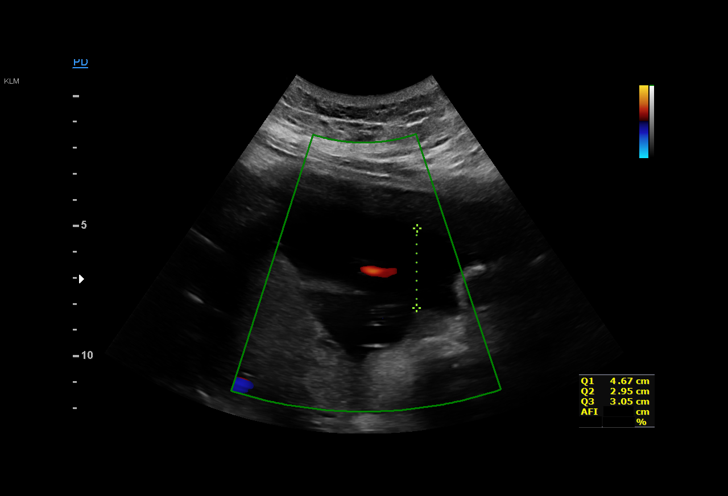
[im 49/61]
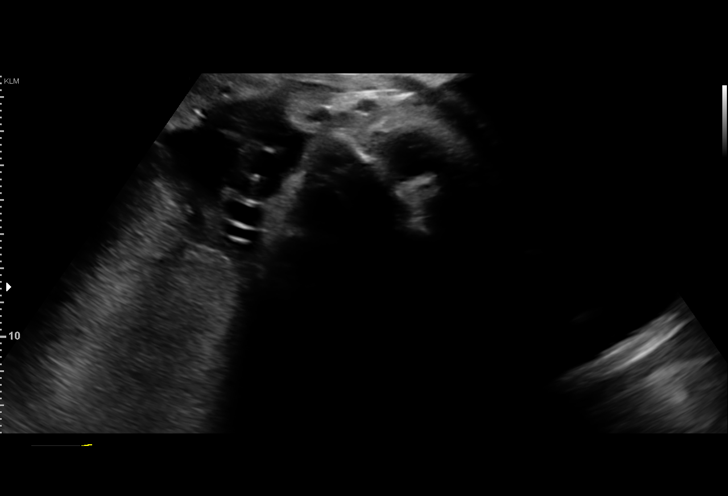
[im 54/61]
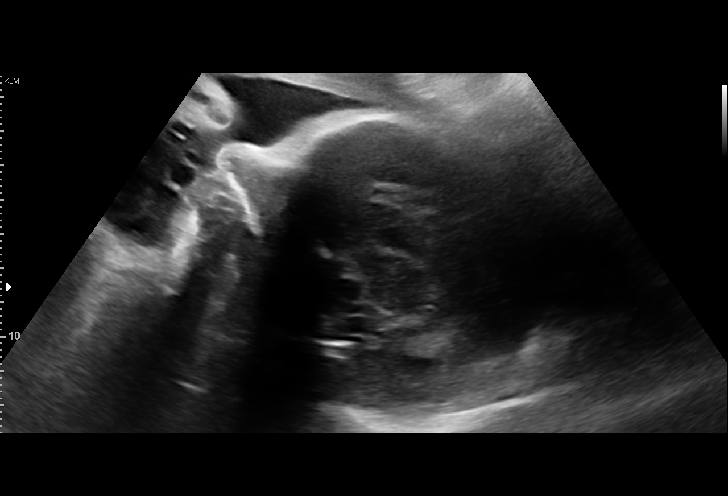
[im 58/61]
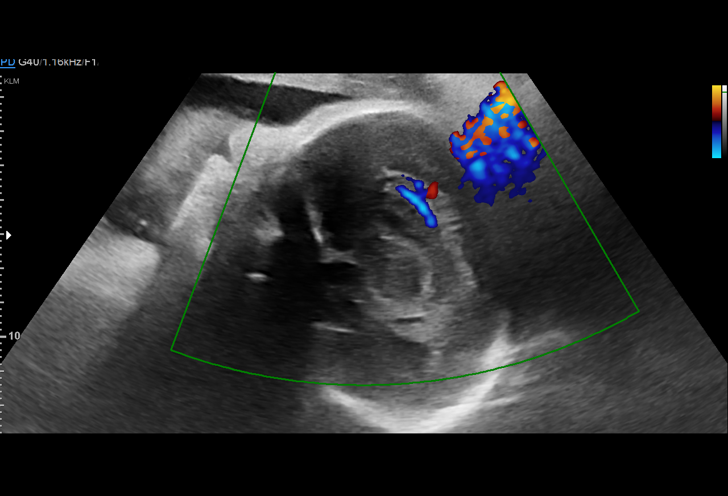

[13 of 28 positions shown; findings below may reference images not displayed]

OB/Gyn Clinic

 ----------------------------------------------------------------------

 ----------------------------------------------------------------------
Indications

  Hypertension - Chronic/Pre-existing            [B5]
  (labetalol)
  30 weeks gestation of pregnancy
  Pre-existing diabetes, type 1, in pregnancy,   [B5]
  third trimester (insulin)
  Fetal abnormality - other known or suspected   [B5]
 ----------------------------------------------------------------------
Fetal Evaluation

 Num Of Fetuses:         1
 Fetal Heart Rate(bpm):  126
 Cardiac Activity:       Observed
 Presentation:           Cephalic
 Placenta:               Posterior
 P. Cord Insertion:      Not well visualized

 Amniotic Fluid
 AFI FV:      Within normal limits

 AFI Sum(cm)     %Tile       Largest Pocket(cm)
 14.3            49

 RUQ(cm)       RLQ(cm)       LUQ(cm)        LLQ(cm)
 4.7           3             3
Biometry
 BPD:      77.2  mm     G. Age:  31w 0d         42  %    CI:        74.54   %    70 - 86
                                                         FL/HC:      21.5   %    19.3 -
 HC:      283.8  mm     G. Age:  31w 1d         22  %    HC/AC:      1.13        0.96 -
 AC:      250.7  mm     G. Age:  29w 2d          9  %    FL/BPD:     79.0   %    71 - 87
 FL:         61  mm     G. Age:  31w 5d         59  %    FL/AC:      24.3   %    20 - 24
 HUM:      56.8  mm     G. Age:  33w 0d         94  %

 Est. FW:    [B5]  gm      3 lb 7 oz     45  %
OB History

 Gravidity:    1         Term:   0        Prem:   0        SAB:   0
 TOP:          0       Ectopic:  0        Living: 0
Gestational Age

 U/S Today:     30w 6d                                        EDD:   [DATE]
 Best:          30w 6d     Det. By:  Early Ultrasound         EDD:   [DATE]
                                     ([DATE])
Anatomy

 Cranium:               Appears normal         Aortic Arch:            Not well visualized
 Cavum:                 Appears normal         Ductal Arch:            Not well visualized
 Ventricles:            Ventriculomegaly       Diaphragm:              Appears normal
 Choroid Plexus:        Dangling Choroid       Stomach:                Appears normal, left
                                                                       sided
 Cerebellum:            Appears normal         Abdomen:                Appears normal
 Posterior Fossa:       Appears normal         Abdominal Wall:         Not well visualized
 Nuchal Fold:           Not applicable (>20    Cord Vessels:           Appears normal (3
                        wks GA)                                        vessel cord)
 Face:                  Appears normal         Kidneys:                Abnormal, see
                        (orbits and profile)
                                                                       comments
 Lips:                  Appears normal         Bladder:                Appears normal
 Thoracic:              Appears normal         Spine:                  Appears normal
 Heart:                 Not well visualized    Upper Extremities:      Abnormal, see
                                                                       comments
 RVOT:                  Not well visualized    Lower Extremities:      Appears normal
 LVOT:                  Not well visualized

 Other:  Male gender. Technically difficult due to advanced gestational age.
Cervix Uterus Adnexa

 Cervix
 Not visualized (advanced GA >[B5])
Comments

 Ms. CELESTINUS is here for follow up ultrasound. She had prior
 exams at [facility] please see [REDACTED] [REDACTED] for
 details.
 In summary, Ms. CELESTINUS has an undetermined diagnosis but
 most consistent of some form of Radial Ray
 hypoplasia/aplasia
 She had a low risk Mat 21 and panorama. She has met with a
 genetic counselor as well reviewing the details of findings and
 offering
 genetic screening and testing. Ms. CELESTINUS has declined an
 amniocentesis as she would not like to add additional risk to
 the pregnancy
 as this is a desired pregnancy.

 Secondly, a prior fetal echocardiography report notes a
 perimembranous VSD. We did not observe this finding today.

 Prior exams documented the following:
 Ventriculomegaly- we observed this finding today of
 Right renal agenesis with left renal hydronephrosis (today
 mm)
 Absent/hypoplastic radii (seen today)
 Possible partial or complete agensis of the corpus callosum
 (absent CSP)- a parital pericallosal artery was observed.
 However, this was suboptimally
 observed given fetal position.

 I reviewed all of the above with Ms. CELESTINUS.

 We will continue serial growth exam and schedule a neonatal
 consultation in the next 4-8 weeks.
Impression

 EFW 45th% AC 9th%.
 Normal amniotic fluid.
Recommendations

 Follow up growth in 4 weeks.

## 2017-11-23 ENCOUNTER — Other Ambulatory Visit (HOSPITAL_COMMUNITY): Payer: Self-pay | Admitting: *Deleted

## 2017-11-23 DIAGNOSIS — O359XX0 Maternal care for (suspected) fetal abnormality and damage, unspecified, not applicable or unspecified: Secondary | ICD-10-CM

## 2017-11-28 ENCOUNTER — Ambulatory Visit: Payer: BC Managed Care – PPO | Admitting: Clinical

## 2017-11-28 ENCOUNTER — Ambulatory Visit (INDEPENDENT_AMBULATORY_CARE_PROVIDER_SITE_OTHER): Payer: BC Managed Care – PPO | Admitting: Family Medicine

## 2017-11-28 ENCOUNTER — Encounter: Payer: Self-pay | Admitting: Family Medicine

## 2017-11-28 VITALS — BP 155/91 | Wt 193.4 lb

## 2017-11-28 DIAGNOSIS — O24013 Pre-existing diabetes mellitus, type 1, in pregnancy, third trimester: Secondary | ICD-10-CM

## 2017-11-28 DIAGNOSIS — O10919 Unspecified pre-existing hypertension complicating pregnancy, unspecified trimester: Secondary | ICD-10-CM

## 2017-11-28 DIAGNOSIS — E103413 Type 1 diabetes mellitus with severe nonproliferative diabetic retinopathy with macular edema, bilateral: Secondary | ICD-10-CM

## 2017-11-28 DIAGNOSIS — O359XX0 Maternal care for (suspected) fetal abnormality and damage, unspecified, not applicable or unspecified: Secondary | ICD-10-CM

## 2017-11-28 DIAGNOSIS — E1029 Type 1 diabetes mellitus with other diabetic kidney complication: Secondary | ICD-10-CM

## 2017-11-28 DIAGNOSIS — F332 Major depressive disorder, recurrent severe without psychotic features: Secondary | ICD-10-CM

## 2017-11-28 DIAGNOSIS — O358XX0 Maternal care for other (suspected) fetal abnormality and damage, not applicable or unspecified: Secondary | ICD-10-CM

## 2017-11-28 DIAGNOSIS — O99343 Other mental disorders complicating pregnancy, third trimester: Secondary | ICD-10-CM

## 2017-11-28 DIAGNOSIS — F329 Major depressive disorder, single episode, unspecified: Secondary | ICD-10-CM

## 2017-11-28 DIAGNOSIS — O0990 Supervision of high risk pregnancy, unspecified, unspecified trimester: Secondary | ICD-10-CM

## 2017-11-28 DIAGNOSIS — O0993 Supervision of high risk pregnancy, unspecified, third trimester: Secondary | ICD-10-CM

## 2017-11-28 DIAGNOSIS — O10913 Unspecified pre-existing hypertension complicating pregnancy, third trimester: Secondary | ICD-10-CM

## 2017-11-28 DIAGNOSIS — O24019 Pre-existing diabetes mellitus, type 1, in pregnancy, unspecified trimester: Secondary | ICD-10-CM

## 2017-11-28 DIAGNOSIS — R809 Proteinuria, unspecified: Secondary | ICD-10-CM

## 2017-11-28 DIAGNOSIS — O99340 Other mental disorders complicating pregnancy, unspecified trimester: Secondary | ICD-10-CM

## 2017-11-28 NOTE — BH Specialist Note (Signed)
Integrated Behavioral Health Initial Visit  MRN: [id number] Name: Barbara Donovan  Number of [place] Clinician visits:: 1/6 Session Start time: 2:15  Session End time: 2:30 Total time: 15 minutes  Type of Service: [place] Interpretor:No. Interpretor Name and Language: n/a   Warm Hand Off Completed.       SUBJECTIVE: Barbara Donovan is a 27 y.o. female accompanied by n/a Patient was referred by Darron Doom, MD for depression. Patient reports the following symptoms/concerns: Depression, anhedonia, lack of appetite, fatigue, worry, anxiety, sleep difficulty, lack of support.  Duration of problem: Ongoing; Severity of problem: severe  OBJECTIVE: Mood: Depressed and Affect: Depressed and Tearful Risk of harm to self or others: No plan to harm self or others  LIFE CONTEXT: Family and Social: - School/Work: - Self-Care: - Life Changes: Current pregnancy  GOALS ADDRESSED: Patient will: 1. Reduce symptoms of: anxiety and depression 2. Increase knowledge and/or ability of: self-management skills  3. Demonstrate ability to: Increase healthy adjustment to current life circumstances and Increase motivation to adhere to plan of care  INTERVENTIONS: Interventions utilized: Behavioral Activation and Psychoeducation and/or Health Education  Standardized Assessments completed: GAD-7 and PHQ 9  ASSESSMENT: Patient currently experiencing Major depressive disorder, recurrent, severe, without psychotic features.   Patient may benefit from psychoeducation and brief therapeutic interventions regarding coping with symptoms of depression and anxiety .  PLAN: 1. Follow up with behavioral health clinician on : Two days via phone; one week office visit 2. Behavioral recommendations:  -Begin taking BH medication, as prescribed by medical provider -Read educational materials regarding coping with symptoms of depression and  anxiety -Listen to uplifting song and/or comedy of choice, daily, until next visit -Consider using apps (including sleep sound app) daily, for additional self-coping  3. Referral(s): [place] (In Clinic) 4. "From scale of 1-10, how likely are you to follow plan?": 8  Garlan Fair, LCSW  Depression screen [facility] 2/9 11/28/2017 11/14/2017  Decreased Interest 2 1  Down, Depressed, Hopeless 3 2  PHQ - 2 Score 5 3  Altered sleeping 2 3  Tired, decreased energy 2 1  Change in appetite 3 1  Feeling bad or failure about yourself  3 2  Trouble concentrating 0 0  Moving slowly or fidgety/restless 0 0  Suicidal thoughts 0 0  PHQ-9 Score 15 10   GAD 7 : Generalized Anxiety Score 11/28/2017 11/14/2017  Nervous, Anxious, on Edge 3 1  Control/stop worrying 3 1  Worry too much - different things 3 1  Trouble relaxing 2 1  Restless 0 0  Easily annoyed or irritable 2 1  Afraid - awful might happen 2 0  Total GAD 7 Score 15 5

## 2017-11-28 NOTE — Patient Instructions (Signed)

## 2017-11-29 LAB — COMPREHENSIVE METABOLIC PANEL
A/G RATIO: 1 — AB (ref 1.2–2.2)
ALT: 9 IU/L (ref 0–32)
AST: 10 IU/L (ref 0–40)
Albumin: 2.5 g/dL — ABNORMAL LOW (ref 3.5–5.5)
Alkaline Phosphatase: 60 IU/L (ref 39–117)
BUN / CREAT RATIO: 18 (ref 9–23)
BUN: 9 mg/dL (ref 6–20)
CO2: 22 mmol/L (ref 20–29)
Calcium: 8.4 mg/dL — ABNORMAL LOW (ref 8.7–10.2)
Chloride: 101 mmol/L (ref 96–106)
Creatinine, Ser: 0.51 mg/dL — ABNORMAL LOW (ref 0.57–1.00)
GFR, EST AFRICAN AMERICAN: 152 mL/min/{1.73_m2} (ref >59)
GFR, EST NON AFRICAN AMERICAN: 132 mL/min/{1.73_m2} (ref >59)
GLOBULIN, TOTAL: 2.6 g/dL (ref 1.5–4.5)
Glucose: 64 mg/dL — ABNORMAL LOW (ref 65–99)
POTASSIUM: 4.1 mmol/L (ref 3.5–5.2)
SODIUM: 137 mmol/L (ref 134–144)
TOTAL PROTEIN: 5.1 g/dL — AB (ref 6.0–8.5)

## 2017-11-29 LAB — CBC
HEMATOCRIT: 31 % — AB (ref 34.0–46.6)
HEMOGLOBIN: 10.5 g/dL — AB (ref 11.1–15.9)
MCH: 28.2 pg (ref 26.6–33.0)
MCHC: 33.9 g/dL (ref 31.5–35.7)
MCV: 83 fL (ref 79–97)
Platelets: 411 10*3/uL (ref 150–450)
RBC: 3.72 x10E6/uL — ABNORMAL LOW (ref 3.77–5.28)
RDW: 12.7 % (ref 12.3–15.4)
WBC: 6.6 10*3/uL (ref 3.4–10.8)

## 2017-11-29 LAB — PROTEIN / CREATININE RATIO, URINE
Creatinine, Urine: 69.9 mg/dL
Protein, Ur: 680 mg/dL
Protein/Creat Ratio: 9728 mg/g creat — ABNORMAL HIGH (ref 0–200)

## 2017-11-29 NOTE — Progress Notes (Signed)
PRENATAL VISIT NOTE  Subjective:  Barbara Donovan is a 27 y.o. G1P0 at [redacted]w[redacted]d being seen today for ongoing prenatal care.  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  Patient reports depression wants to see Roselyn Reef. Denies headache, vision changes, RUQ pain  Contractions: Not present. Vag. Bleeding: None.  Movement: Present. Denies leaking of fluid.   The following portions of the patient's history were reviewed and updated as appropriate: allergies, current medications, past family history, past medical history, past social history, past surgical history and problem list. Problem list updated.  Objective:   Vitals:   11/28/17 1324 11/28/17 1331  BP: (!) 140/91 (!) 155/91  Weight: 193 lb 6.4 oz (87.7 kg)     Fetal Status: Fetal Heart Rate (bpm): 137 Fundal Height: 28 cm Movement: Present     General:  Alert, oriented and cooperative. Patient is in no acute distress.  Skin: Skin is warm and dry. No rash noted.   Cardiovascular: Normal heart rate noted  Respiratory: Normal respiratory effort, no problems with respiration noted  Abdomen: Soft, gravid, appropriate for gestational age.  Pain/Pressure: Absent     Pelvic: Cervical exam deferred        Extremities: Normal range of motion.  Edema: Moderate pitting, indentation subsides rapidly  Mental Status: Normal mood and affect. Normal behavior. Normal judgment and thought content.   Assessment  and Plan:  Pregnancy: G1P0 at [redacted]w[redacted]d  1. Type 1 diabetes mellitus affecting pregnancy, antepartum Did not do well on pump. Continue insulin. Last A1C was 6.6 improved. Still having some difficulty with N/V--has been hospitalized with this on more than one occasion. Weight is down 6 pounds since last visit 2 wks ago. Will need antepartum testing at 32 wks  2. 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 Supervision of high risk pregnancy, antepartum Up to date  4. Pregnancy affected by multiple congenital anomalies of fetus, single or unspecified fetus Reviewed last u/s findings  5. Chronic hypertension affecting pregnancy BP is up today--denies symptoms and is taking her meds. Continue Labetalol and Procardia and ASA Check labs Will need antepartum testing at 32 wks - CBC - Comprehensive metabolic panel - Protein / creatinine ratio, urine  6. Depression during pregnancy, antepartum Advised that she might want to consider meds as we get closer to pp - Ambulatory referral to [place]  7. 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 Given that pregnancy can worsen retinopathy, I called her ophthalmologist and they will ensure f/u care is done with retina specialist Dr. Coralyn Pear.  Preterm labor symptoms and general obstetric precautions including but not limited to vaginal bleeding, contractions, leaking of fluid and fetal movement were reviewed in detail with the patient. Please refer to After Visit Summary for other counseling recommendations.  Return in 1 week (on 12/05/2017) for needs MD, OB visit and BPP.  Future Appointments  Date Time Provider [place]  12/05/2017  1:15 PM WOC-WOCA  NST WOC-WOCA WOC  12/05/2017  2:15 PM Truett Mainland, DO [place]  12/05/2017  3:00 PM    12/20/2017  1:30 PM WH-MFC [name] 5 WH-MFCUS MFC-US    [name], MD

## 2017-11-30 ENCOUNTER — Telehealth: Payer: Self-pay | Admitting: Clinical

## 2017-11-30 NOTE — Telephone Encounter (Signed)
[facility] Medication Management Phone Note  Left HIPPA-compliant message to call back Roselyn Reef from [facility]  at [phone].    MRN: [id number] NAME: Barbara Donovan  Time Call Initiated: 9:54 Time Call Completed: 9:56  Garlan Fair, LCSW

## 2017-12-01 ENCOUNTER — Encounter: Payer: Self-pay | Admitting: Family Medicine

## 2017-12-01 NOTE — Progress Notes (Signed)
    Dr. Kennon Rounds,   I received a call from someone from an [facility] specialist who stated that she was returning a call that you made in regards to a concern of a pt who seemed to have fallen through the cracks in getting eye care due to patient being diagnosed with diabetes. They wanted to let you know that they followed up and found that the patient had an appt with a Retinal Specialist in August. However the pt called and cancelled that appt the day before she was to come. The person that I spoke with stated that they have left several messages and has even attempted to pt's emergency contact number so now they are waiting to hear back from her.    Mel Almond, RN

## 2017-12-05 ENCOUNTER — Ambulatory Visit (INDEPENDENT_AMBULATORY_CARE_PROVIDER_SITE_OTHER): Payer: BC Managed Care – PPO | Admitting: *Deleted

## 2017-12-05 ENCOUNTER — Ambulatory Visit (INDEPENDENT_AMBULATORY_CARE_PROVIDER_SITE_OTHER): Payer: BC Managed Care – PPO | Admitting: Family Medicine

## 2017-12-05 ENCOUNTER — Ambulatory Visit: Payer: Self-pay

## 2017-12-05 ENCOUNTER — Ambulatory Visit: Payer: BC Managed Care – PPO | Admitting: Clinical

## 2017-12-05 VITALS — BP 143/88 | HR 88 | Wt 198.4 lb

## 2017-12-05 DIAGNOSIS — O359XX0 Maternal care for (suspected) fetal abnormality and damage, unspecified, not applicable or unspecified: Secondary | ICD-10-CM

## 2017-12-05 DIAGNOSIS — Z3A32 32 weeks gestation of pregnancy: Secondary | ICD-10-CM

## 2017-12-05 DIAGNOSIS — O24019 Pre-existing diabetes mellitus, type 1, in pregnancy, unspecified trimester: Secondary | ICD-10-CM

## 2017-12-05 DIAGNOSIS — O0990 Supervision of high risk pregnancy, unspecified, unspecified trimester: Secondary | ICD-10-CM

## 2017-12-05 DIAGNOSIS — O10919 Unspecified pre-existing hypertension complicating pregnancy, unspecified trimester: Secondary | ICD-10-CM

## 2017-12-05 DIAGNOSIS — O24013 Pre-existing diabetes mellitus, type 1, in pregnancy, third trimester: Secondary | ICD-10-CM

## 2017-12-05 DIAGNOSIS — O358XX0 Maternal care for other (suspected) fetal abnormality and damage, not applicable or unspecified: Secondary | ICD-10-CM

## 2017-12-05 DIAGNOSIS — O10913 Unspecified pre-existing hypertension complicating pregnancy, third trimester: Secondary | ICD-10-CM

## 2017-12-05 IMAGING — US US FETAL BPP W/ NON-STRESS
1 series · 13 of 14 positions shown · non-contrast
Comparison: none

[Series 1: us fetal bpp w/nonstress · 14 acquisitions, 13 frames shown]
[im 1/14]
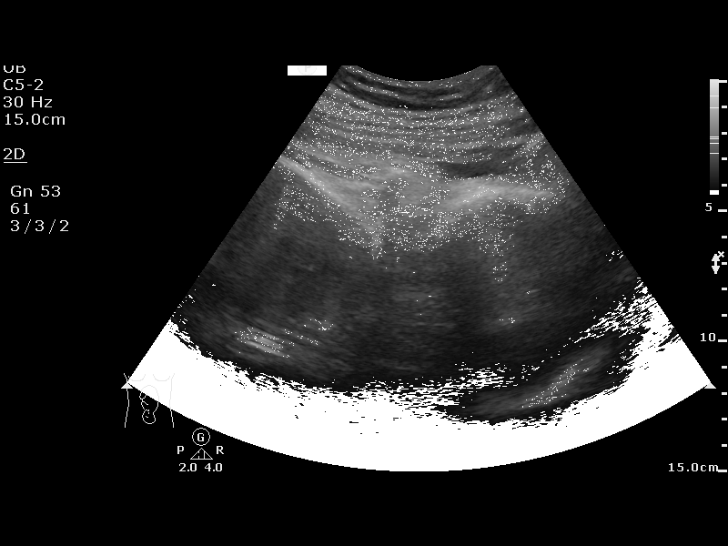
[im 2/14]
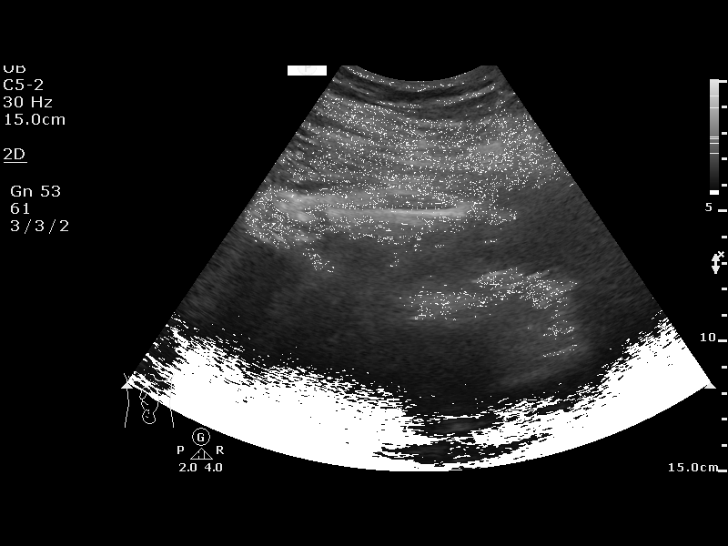
[im 3/14]
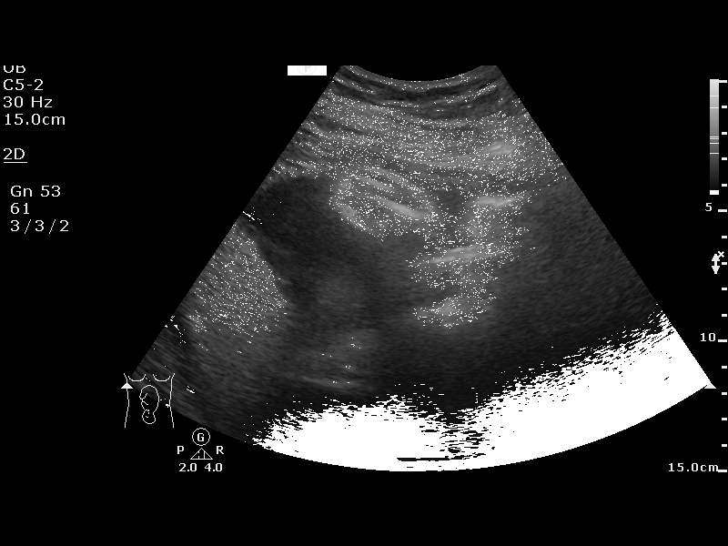
[im 4/14]
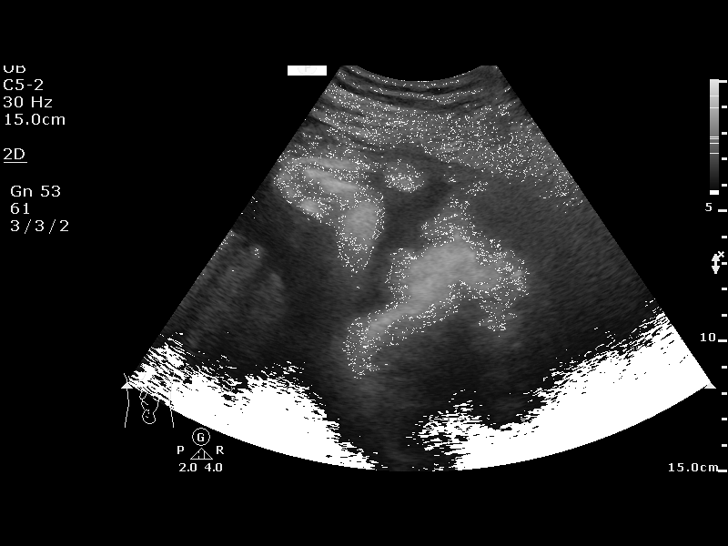
[im 5/14]
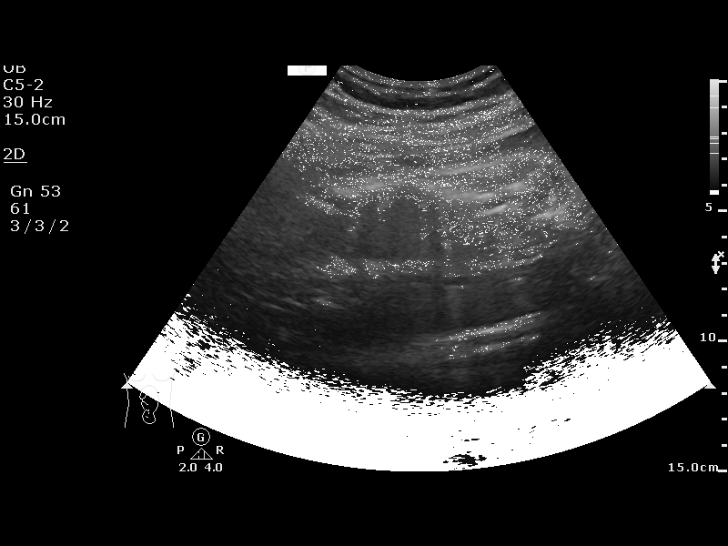
[im 6/14]
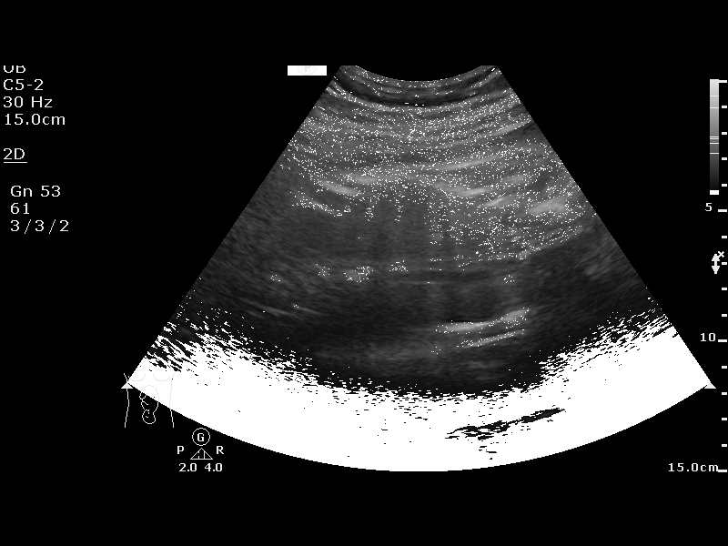
[im 8/14]
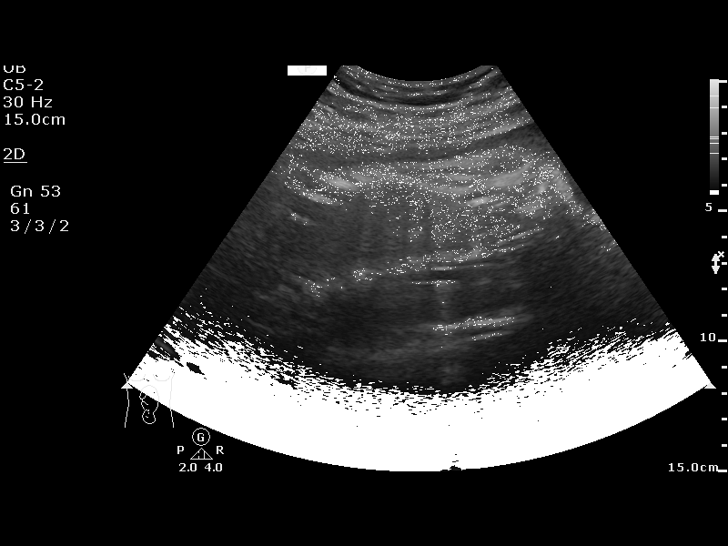
[im 9/14]
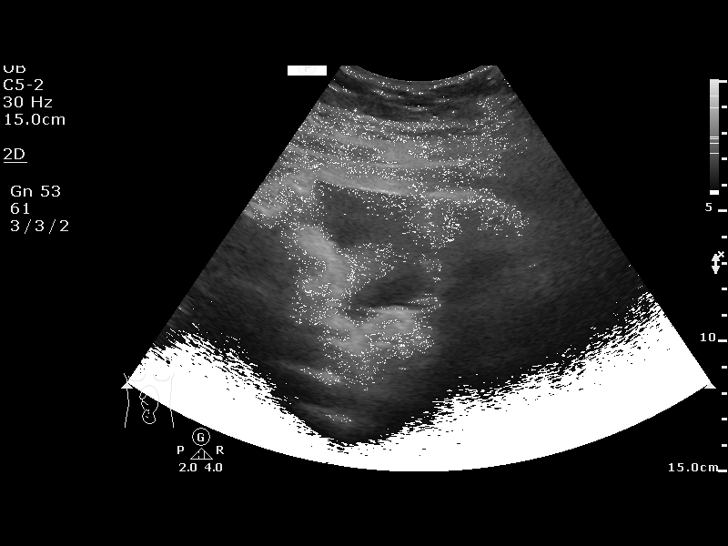
[im 10/14]
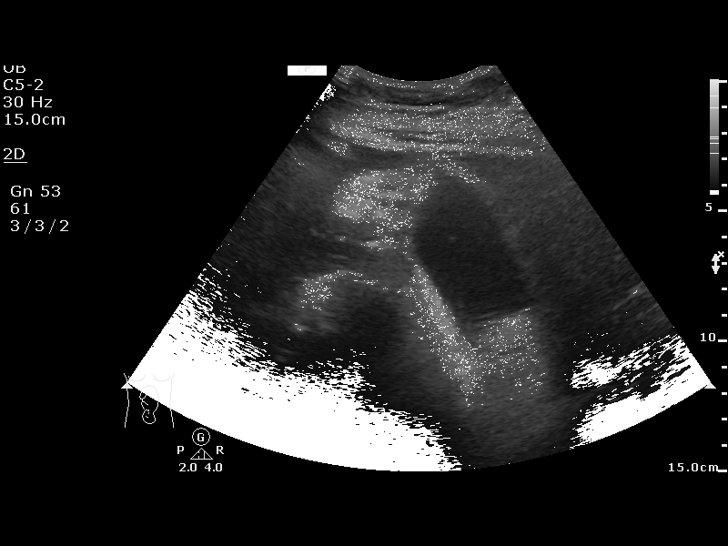
[im 11/14]
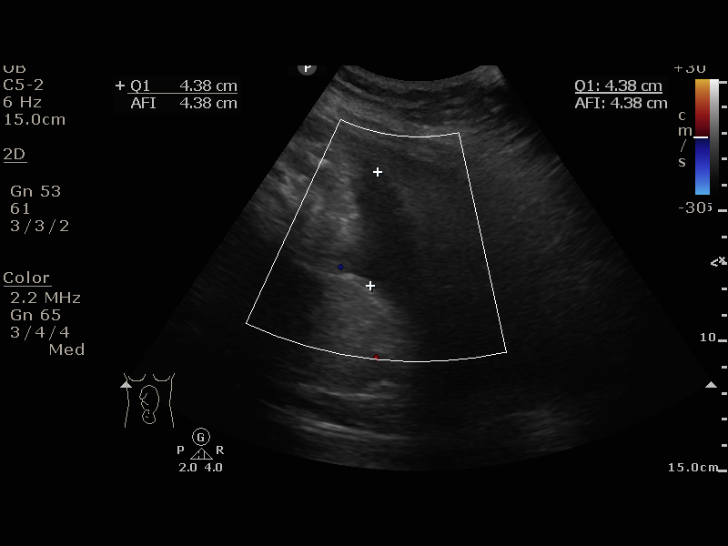
[im 12/14]
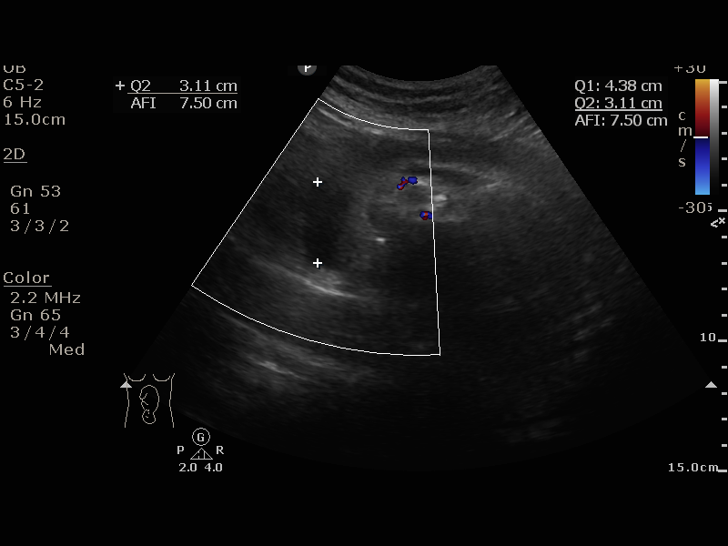
[im 13/14]
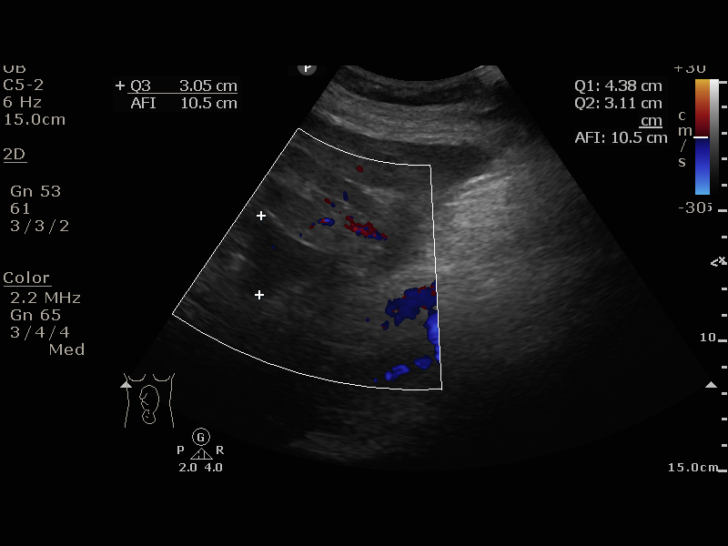
[im 14/14]
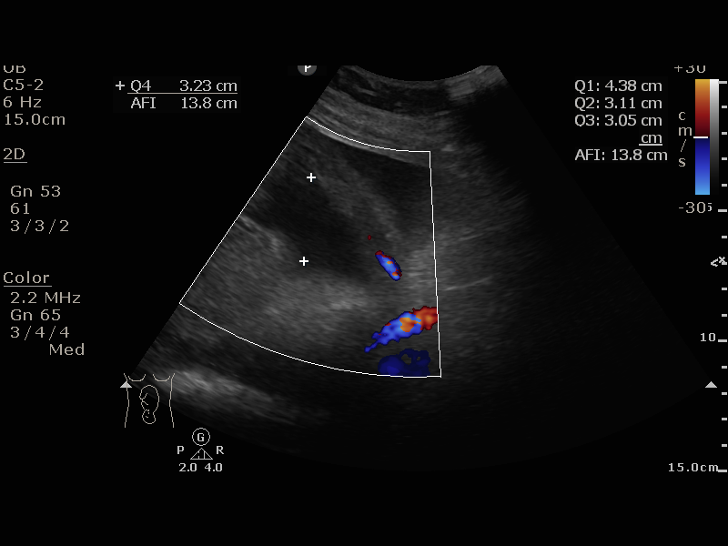

[13 of 14 positions shown; findings below may reference images not displayed]

OB/Gyn Clinic
                   Women's                                  [REDACTED]
                   Healthcare
                   OB/Gyn Clinic
                   [7P]

 ----------------------------------------------------------------------

 ----------------------------------------------------------------------
Service(s) Provided

 ----------------------------------------------------------------------
Indications

  32 weeks gestation of pregnancy
  Unspecified pre-existing hypertension          [7P]
  complicating pregnancy, third trimester
  Pre-existing diabetes, type 1, in pregnancy,   [7P]
  third trimester

 ----------------------------------------------------------------------
Fetal Evaluation

 Num Of Fetuses:         1
 Preg. Location:         Intrauterine
 Cardiac Activity:       Observed
 Presentation:           Cephalic
 Amniotic Fluid
 AFI FV:      Within normal limits

 AFI Sum(cm)     %Tile       Largest Pocket(cm)
 13.77           46

 RUQ(cm)       RLQ(cm)       LUQ(cm)        LLQ(cm)

Biophysical Evaluation

 Amniotic F.V:   Pocket => 2 cm two         F. Tone:        Observed
                 planes
 F. Movement:    Observed                   N.S.T:          Reactive
 F. Breathing:   Observed                   Score:          [DATE]
OB History

 Gravidity:    1         Term:   0        Prem:   0        SAB:   0
 TOP:          0       Ectopic:  0        Living: 0
Gestational Age

 Best:          32w 5d     Det. By:  Early Ultrasound         EDD:   [DATE]
                                     ([DATE])
Impression

 Normal amniotic fluid volume. Antenatal testing is reassuring.
 BPP [DATE].
Recommendations

 Continue weekly antenatal testing till delivery.
              MIMOSE

## 2017-12-05 MED ORDER — LABETALOL HCL 300 MG PO TABS: 600.0000 mg | ORAL_TABLET | Freq: Three times a day (TID) | ORAL | 3 refills | Status: DC

## 2017-12-05 MED ORDER — BASAGLAR KWIKPEN 100 UNIT/ML ~~LOC~~ SOPN: 25.0000 [IU] | PEN_INJECTOR | Freq: Every day | SUBCUTANEOUS | 0 refills | Status: DC

## 2017-12-05 NOTE — Progress Notes (Signed)
Subjective:  Barbara Donovan is a 27 y.o. G1P0 at [redacted]w[redacted]d being seen today for ongoing prenatal care.  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  GDM: Patient taking insulin.  Reports hypoglycemic episodes.  Tolerating medication well. Having low blood sugars in the middle of the night. Eating snack before bedtime. Didn't bring logbook Fasting: 90-100 2hr PP: 120-140  Patient reports SOB.  Contractions: Not present. Vag. Bleeding: None.  Movement: Present. Denies leaking of fluid.   The following portions of the patient's history were reviewed and updated as appropriate: allergies, current medications, past family history, past medical history, past social history, past surgical history and problem list. Problem list updated.  Objective:   Vitals:   12/05/17 1337  BP: (!) 143/88  Pulse: 88  Weight: 198 lb 6.4 oz (90 kg)    Fetal Status: Fetal Heart Rate (bpm): NST   Movement: Present     General:  Alert, oriented and cooperative. Patient is in no acute distress.  Skin: Skin is warm and dry. No rash noted.   Cardiovascular: Normal heart rate noted  Respiratory: Normal respiratory effort, no problems with respiration noted  Abdomen: Soft, gravid, appropriate for gestational age. Pain/Pressure: Present     Pelvic: Vag. Bleeding: None     Cervical exam deferred        Extremities: Normal range of motion.     Mental Status: Normal mood and  affect. Normal behavior. Normal judgment and thought content.   Urinalysis:      Assessment and Plan:  Pregnancy: G1P0 at [redacted]w[redacted]d  1. Supervision of high risk pregnancy, antepartum FHT and FH normal. SOB when laying flat - instructed pt to lay on side.  2. Type 1 diabetes mellitus affecting pregnancy, antepartum Decrease glargine to 25 units Continue aspart 7/7/7 Reminded patient to get eyes checked. Growth [name] 12/3 BPP 10/10  3. Chronic hypertension affecting pregnancy Labetalol 600mg  TID Nifedipine 60mg  BID  4. Pregnancy affected by multiple congenital anomalies of fetus, single or unspecified fetus VSD, ? agenesis of corpus callosum Has NICU consult on 12/20/2017  Preterm labor symptoms and general obstetric precautions including but not limited to vaginal bleeding, contractions, leaking of fluid and fetal movement were reviewed in detail with the patient. Please refer to After Visit Summary for other counseling recommendations.  Return in about 1 week (around 12/12/2017) for NST/BPP only; .   Truett Mainland, DO

## 2017-12-05 NOTE — Progress Notes (Signed)
Pt states she is feeling SOB x1 week.  O2 sat = 99.  Pt was offered [facility] today and she declined.

## 2017-12-05 NOTE — BH Specialist Note (Signed)
Patient not seen.

## 2017-12-05 NOTE — Progress Notes (Signed)

## 2017-12-07 ENCOUNTER — Telehealth: Payer: Self-pay | Admitting: Family Medicine

## 2017-12-07 NOTE — Telephone Encounter (Signed)
Left a detailed message about pt's 11/25 appointment time being changed from 1415 to 1115 due to the provider not being available in the afternoon. Left office number if the time did not work and needed to reschedule.

## 2017-12-09 NOTE — Progress Notes (Signed)
[facility] Note  12/12/2017     CHIEF COMPLAINT Patient presents for Retina Evaluation   HISTORY OF PRESENT ILLNESS: Barbara Donovan is a 27 y.o. female who presents to the clinic today for:   HPI    Retina Evaluation    In both eyes.  This started months ago.  Duration of months.  Context:  distance vision.  Treatments tried include no treatments.  I, the attending physician,  performed the HPI with the patient and updated documentation appropriately.          Comments    Patient here for 4 Month retinal eval for CRVO with CME ( OD>OS). Patient states vision is slightly blurry. Has no eye pain.        Last edited by Bernarda Caffey, MD on 12/13/2017 12:12 AM. (History)    Patient states has been taking 2 new blood pressure medications that have helped blood pressure. Blood pressure was 130/72 in clinic today.   Referring physician: Renato Shin, MD [address] [place], [place] [address]  HISTORICAL INFORMATION:   Selected notes from the MEDICAL RECORD NUMBER Referred by Dr. [name] for concern of bilateral CRVO LEE-  Ocular Hx-  PMH-     CURRENT MEDICATIONS: No current outpatient medications on file. (Ophthalmic Drugs)   No current facility-administered medications for this visit.  (Ophthalmic Drugs)   Current Outpatient Medications (Other)  Medication Sig  . aspirin 81 MG chewable tablet Chew 81 mg by mouth daily.   . Blood Pressure Monitoring (BLOOD PRESSURE CUFF) MISC 1 kit by Does not apply route daily. (Patient not taking: Reported on 11/28/2017)  . famotidine (PEPCID) 20 MG tablet Take 20 mg by mouth daily as needed for heartburn.   . insulin aspart (NOVOLOG FLEXPEN) 100 UNIT/ML FlexPen Inject 7 Units into the skin 3 (three) times daily with meals.  . Insulin Glargine (BASAGLAR KWIKPEN) 100 UNIT/ML SOPN Inject 0.25 mLs (25 Units total) into the skin at bedtime.  Marland Kitchen labetalol (NORMODYNE) 300 MG tablet Take 2 tablets  (600 mg total) by mouth 3 (three) times daily.  Marland Kitchen NIFEdipine (PROCARDIA XL/NIFEDICAL XL) 60 MG 24 hr tablet Take 60 mg by mouth 2 (two) times daily.  . prochlorperazine (COMPAZINE) 10 MG tablet Take 1 tablet (10 mg total) by mouth every 6 (six) hours as needed for nausea or vomiting. (Patient not taking: Reported on 11/14/2017)  . promethazine (PHENERGAN) 25 MG suppository Place 1 suppository (25 mg total) rectally every 6 (six) hours as needed for nausea. (Patient not taking: Reported on 11/14/2017)  . Pyridoxine HCl (B-6 PO) Take 1 tablet by mouth daily.    No current facility-administered medications for this visit.  (Other)      REVIEW OF SYSTEMS: ROS    Positive for: Endocrine, Cardiovascular, Eyes   Last edited by Theodore Demark on 12/12/2017  2:32 PM. (History)       ALLERGIES No Known Allergies  PAST MEDICAL HISTORY Past Medical History:  Diagnosis Date  . Diabetes ([place])    TYPE I  . Hypertension complicating pregnancy    Past Surgical History:  Procedure Laterality Date  . NO PAST SURGERIES    . WISDOM TOOTH EXTRACTION      FAMILY HISTORY Family History  Problem Relation Age of Onset  . Diabetes Mother   . Diabetes Father   . Cataracts Father   . Amblyopia Neg Hx   . Blindness Neg Hx   .  Glaucoma Neg Hx   . Macular degeneration Neg Hx   . Retinal detachment Neg Hx   . Strabismus Neg Hx   . Retinitis pigmentosa Neg Hx     SOCIAL HISTORY Social History   Tobacco Use  . Smoking status: Never Smoker  . Smokeless tobacco: Never Used  Substance Use Topics  . Alcohol use: Not Currently  . Drug use: Never         OPHTHALMIC EXAM:  Base Eye Exam    Visual Acuity (Snellen - Linear)      Right Left   Dist cc 20/60 20/80 +2   Dist ph cc NI NI   Correction:  Glasses       Tonometry (Tonopen, 2:25 PM)      Right Left   Pressure 15 13       Pupils      Dark Light Shape React APD   Right 5 5 Round NR None   Left 5 5 Round NR None        Visual Fields (Counting fingers)      Left Right    Full Full       Extraocular Movement      Right Left    Full, Ortho Full, Ortho       Neuro/Psych    Oriented x3:  Yes   Mood/Affect:  Normal       Dilation    Both eyes:  1.0% Mydriacyl, 2.5% Phenylephrine @ 2:25 PM        Slit Lamp and Fundus Exam    Slit Lamp Exam      Right Left   Lids/Lashes Normal Normal   Conjunctiva/Sclera melanosis White and quiet   Cornea Clear Clear   Anterior Chamber Deep and quiet Deep and quiet   Iris Round and moderately dilated, No NVI Round and moderately dilated, No NVI   Lens trace Nuclear sclerosis and cortical changes Trace Nuclear sclerosis and cortical changes   Vitreous Normal Normal       Fundus Exam      Right Left   Disc Pink and Sharp; +SVP Pink and Sharp, large pre-retinal heme off superior disc   C/D Ratio 0.5, no NVD 0.4   Macula Blunted foveal reflex, +central edema, +scattered IRH,  +ERM Blunted foveal reflex, central edema, +CWS, +NV   Vessels Tortuous, Copper wiring, AV crossing changes, veins dilated, +NV Tortuous, Copper wiring, AV crossing changes, veins dilated, +NV   Periphery Attached, 360 IRH about the disc, scattered CWS posteriorly greatest nasally, IRH does not spread anteriorly, scattered DBH greater posteriorly. Attached, 360 IRH about the disc, scattered CWS posteriorly, IRH does not spread anteriorly, CWS nasal to disc        Refraction    Wearing Rx      Sphere Cylinder Axis   Right -1.75 +0.25 012   Left -1.75 +0.50 002       Manifest Refraction (Auto)      Sphere Cylinder Axis Dist VA   Right -2.00 +0.75 002 20/50   Left -2.00 +1.00 173 20/50          IMAGING AND PROCEDURES  Imaging and Procedures for _0 @  US FETAL BPP W/NONSTRESS       ----------------------------------------------------------------------  OBSTETRICS REPORT                       (Signed Final 12/12/2017 04:45  pm) ---------------------------------------------------------------------- Patient Info  ID #:  [id number]                          D.O.B.:  05-16-1990 (27 yrs)  Name:       Barbara Donovan                  Visit Date: 12/12/2017 11:50 am ---------------------------------------------------------------------- Performed By  Performed By:     Derinda Late RN      Secondary Phy.:   [facility]                                                             [place]  Attending:        Arlina Robes MD       Address:          [facility]                                                             [place], [place]  [address]  Referred By:      [facility]       Location:         [facility]                    [facility]                               [place]  Ref. Address:     [facility]                    [place]                    [place], [place] ---------------------------------------------------------------------- Orders   #  Description                          Code         Ordered By   1  US FETAL BPP W/NONSTRESS             79150.5      Arlina Robes  ----------------------------------------------------------------------   #  Order #                    Accession #                 Episode #   1  [id number]                  [id number]                   [id number]  ---------------------------------------------------------------------- Indications   [redacted] weeks gestation of pregnancy                Z3A.33   Pre-existing essential hypertension            F00.712   complicating pregnancy, third trimester   Pre-existing diabetes, type 1, in pregnancy,   O24.013   third trimester  ---------------------------------------------------------------------- Fetal Evaluation  Num Of Fetuses:         1  Preg. Location:         Intrauterine  Cardiac Activity:       Observed  Fetal Lie:              Maternal right side  Presentation:           Cephalic  Amniotic Fluid  AFI FV:      Within normal limits  AFI Sum(cm)     %Tile       Largest Pocket(cm)  15.78  57          5.97  RUQ(cm)       RLQ(cm)       LUQ(cm)        LLQ(cm)  3.97          2.69          5.97           3.15  Comment:    Breathing noted intermittently, but not sustained. ---------------------------------------------------------------------- Biophysical Evaluation  Amniotic F.V:   Pocket => 2 cm two         F. Tone:        Observed                  planes  F. Movement:    Observed                   N.S.T:          Reactive  F. Breathing:   Not Observed               Score:          8/10 ---------------------------------------------------------------------- OB History  Gravidity:    1         Term:   0        Prem:   0        SAB:   0  TOP:          0       Ectopic:  0        Living: 0 ---------------------------------------------------------------------- Gestational Age  Best:          33w 5d     Det. ByLoman Chroman         EDD:   01/25/18                                      (05/30/17) ---------------------------------------------------------------------- Impression  BPP 8/10 (-2  for breathing)  Vertex ---------------------------------------------------------------------- Recommendations  Continue with antenatal testing as  indicated ----------------------------------------------------------------------                  Arlina Robes, MD Electronically Signed Final Report   12/12/2017 04:45 pm ----------------------------------------------------------------------       OCT, Retina - OU - Both Eyes       Right Eye Quality was good. Central Foveal Thickness: 524. Progression has improved. Findings include abnormal foveal contour, intraretinal fluid, no SRF, intraretinal hyper-reflective material (+CME -- slightly improved from prior).   Left Eye Quality was good. Central Foveal Thickness: 599. Progression has improved. Findings include intraretinal fluid, subretinal fluid, abnormal foveal contour, intraretinal hyper-reflective material, macular pucker.   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 CME - cystoid macular edema. PED - pigment epithelial detachment. IRF - intraretinal fluid. SRF - subretinal fluid. EZ - ellipsoid zone. ERM - epiretinal membrane. ORA - outer retinal atrophy. ORT - outer retinal tubulation. SRHM - subretinal hyper-reflective material                  ASSESSMENT/PLAN:    ICD-10-CM   1. 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 Retinal edema H35.81 OCT, Retina - OU -  Both Eyes  3. [redacted] weeks gestation of pregnancy Z3A.15   4. 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 Hypertensive retinopathy of both eyes H35.033     1,2. Type 1 DM with Proliferative diabetic retinopathy and DME OU - The incidence, risk factors for progression, natural history and treatment options for diabetic retinopathy were discussed with patient.   - The need for close monitoring of blood glucose, blood pressure, and serum lipids,  avoiding cigarette or any type of tobacco, and the need for long term follow up was also discussed with patient. - exam shows scattered NVE OU and persistent DME OU - OCT shows macular edema, both eyes -- slightly improved from prior (08/03/17) - BCVA down to 20/60 OD, 20/80 OS - discussed findings and prognosis - recommend PRP OU, OS first today - pt unable to stay for laser today, but can return tomorrow at 230 - f/u tomorrow for PRP OS, then 1-2 wks for PRP OD  3. 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 - BP elevated to 180s / 100s at Dr. [name] office on day of initial presentation here - patient on two new blood pressure meds,checked BP today in our office and was 130 / 72 today - concern for pregnancy-related vasculopathy, coagulopathy -- pt was sent to [facility] triage and was discharged with no work up initiated once BP normalized - discussed treatment options and unknown risks of intravitreal injection to fetus -- recommend observation for now -- pt in agreement  4,5. Hypertensive retinopathy OU - concern for pregnancy related hypertension (I.e. Pre-eclampsia, Eclampsia, HELLP, etc) - BP improved today on new meds, which is likely allowed for fsome improvement in macular edema - discussed importance of tight BP control - monitor  3. [redacted] wks gestation pregnancy - pt reports that she may be induced around 37 wks due to erratic BPs    Ophthalmic Meds Ordered this visit:  No orders of the defined types were placed in this encounter.      Return in 1 day (on 12/13/2017) for  dilate OS, PRP laser OS.  There are no Patient Instructions on file for this visit.   Explained the diagnoses, plan, and follow up with the patient and they expressed understanding.  Patient expressed understanding of the importance of proper follow up care.  This document serves as a record of services personally performed by Gardiner Sleeper, MD, PhD. It was created on their behalf by Ernest Mallick, OA, an ophthalmic assistant. The creation of this record is the provider's dictation and/or activities during the visit.    Electronically signed by: Ernest Mallick, OA  11.25.19 12:13 AM    Gardiner Sleeper, M.D., Ph.D. Diseases & Surgery of the Retina and Vitreous [facility]   I have reviewed the above documentation for accuracy and completeness, and I agree with the above. Gardiner Sleeper, M.D., Ph.D. 12/13/17 12:25 AM    Abbreviations: M myopia (nearsighted); A astigmatism; H hyperopia (farsighted); P presbyopia; Mrx spectacle prescription;  CTL contact lenses; OD right eye; OS left eye; OU both eyes  XT exotropia; ET esotropia; PEK punctate epithelial keratitis; PEE punctate epithelial erosions; DES dry eye syndrome; MGD meibomian gland dysfunction; ATs artificial tears; PFAT's preservative free artificial tears; [place]ter nuclear sclerotic cataract; PSC posterior subcapsular cataract; ERM epi-retinal membrane; PVD posterior vitreous detachment; RD retinal detachment; DM diabetes mellitus; DR diabetic retinopathy; NPDR non-proliferative diabetic retinopathy; PDR proliferative diabetic retinopathy; CSME clinically significant macular edema; DME diabetic macular edema; dbh dot blot hemorrhages; CWS cotton wool spot; POAG primary open angle glaucoma; C/D cup-to-disc ratio; HVF humphrey visual field; GVF goldmann visual field; OCT optical coherence tomography; IOP intraocular pressure; BRVO Branch retinal vein occlusion; CRVO central retinal vein occlusion; CRAO central retinal  artery occlusion; BRAO branch retinal artery occlusion; RT retinal tear; SB scleral buckle; PPV pars plana vitrectomy; VH Vitreous hemorrhage; PRP panretinal laser photocoagulation; IVK intravitreal kenalog; VMT vitreomacular traction; MH Macular hole;  NVD neovascularization of the disc; NVE neovascularization elsewhere; AREDS age related eye disease study; ARMD age related macular degeneration; POAG primary open angle glaucoma; EBMD epithelial/anterior basement membrane dystrophy; ACIOL anterior chamber intraocular lens; IOL intraocular lens; PCIOL posterior chamber intraocular lens; Phaco/IOL phacoemulsification with intraocular lens placement; [place] photorefractive keratectomy; LASIK laser assisted in situ keratomileusis; HTN hypertension; DM diabetes mellitus; COPD chronic obstructive pulmonary disease

## 2017-12-12 ENCOUNTER — Ambulatory Visit (INDEPENDENT_AMBULATORY_CARE_PROVIDER_SITE_OTHER): Payer: BC Managed Care – PPO | Admitting: General Practice

## 2017-12-12 ENCOUNTER — Ambulatory Visit: Payer: Self-pay

## 2017-12-12 ENCOUNTER — Other Ambulatory Visit: Payer: Self-pay

## 2017-12-12 ENCOUNTER — Ambulatory Visit (INDEPENDENT_AMBULATORY_CARE_PROVIDER_SITE_OTHER): Payer: BC Managed Care – PPO | Admitting: Ophthalmology

## 2017-12-12 VITALS — BP 150/90 | HR 84

## 2017-12-12 VITALS — BP 130/72 | HR 90

## 2017-12-12 DIAGNOSIS — O0990 Supervision of high risk pregnancy, unspecified, unspecified trimester: Secondary | ICD-10-CM

## 2017-12-12 DIAGNOSIS — H3581 Retinal edema: Secondary | ICD-10-CM

## 2017-12-12 DIAGNOSIS — I10 Essential (primary) hypertension: Secondary | ICD-10-CM | POA: Diagnosis not present

## 2017-12-12 DIAGNOSIS — H34813 Central retinal vein occlusion, bilateral, with macular edema: Secondary | ICD-10-CM | POA: Diagnosis not present

## 2017-12-12 DIAGNOSIS — E103513 Type 1 diabetes mellitus with proliferative diabetic retinopathy with macular edema, bilateral: Secondary | ICD-10-CM | POA: Diagnosis not present

## 2017-12-12 DIAGNOSIS — O358XX0 Maternal care for other (suspected) fetal abnormality and damage, not applicable or unspecified: Secondary | ICD-10-CM

## 2017-12-12 DIAGNOSIS — O10913 Unspecified pre-existing hypertension complicating pregnancy, third trimester: Secondary | ICD-10-CM

## 2017-12-12 DIAGNOSIS — Z3A33 33 weeks gestation of pregnancy: Secondary | ICD-10-CM

## 2017-12-12 DIAGNOSIS — H35033 Hypertensive retinopathy, bilateral: Secondary | ICD-10-CM

## 2017-12-12 DIAGNOSIS — O24019 Pre-existing diabetes mellitus, type 1, in pregnancy, unspecified trimester: Secondary | ICD-10-CM

## 2017-12-12 DIAGNOSIS — O359XX0 Maternal care for (suspected) fetal abnormality and damage, unspecified, not applicable or unspecified: Secondary | ICD-10-CM

## 2017-12-12 DIAGNOSIS — O10919 Unspecified pre-existing hypertension complicating pregnancy, unspecified trimester: Secondary | ICD-10-CM

## 2017-12-12 IMAGING — US US FETAL BPP W/ NON-STRESS
1 series · 11 of 11 positions shown · non-contrast
Comparison: none

[Series 1: us fetal bpp w/nonstress · 11 acquisitions, 11 frames shown]
[im 1/11]
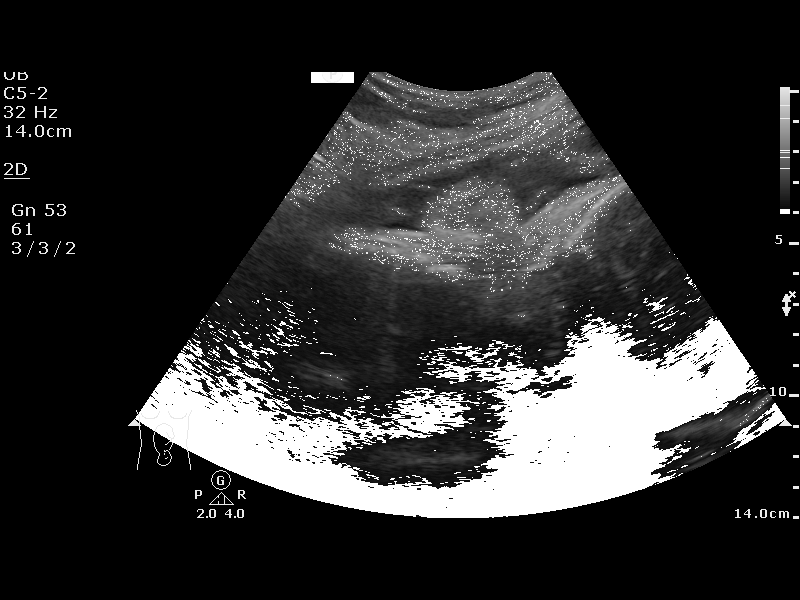
[im 2/11]
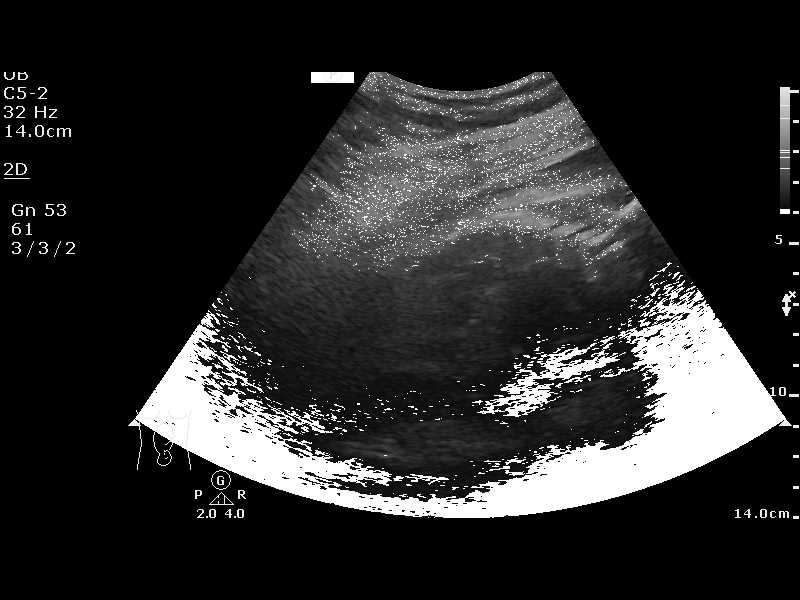
[im 3/11]
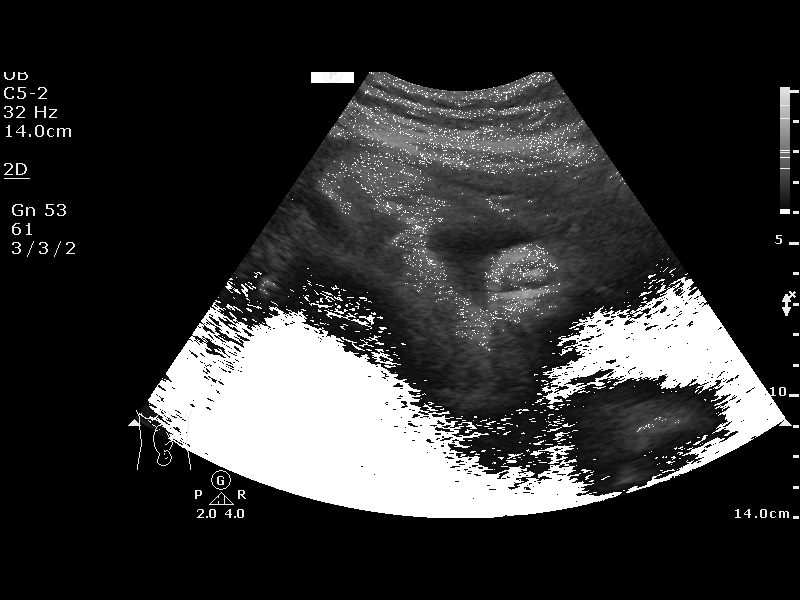
[im 4/11]
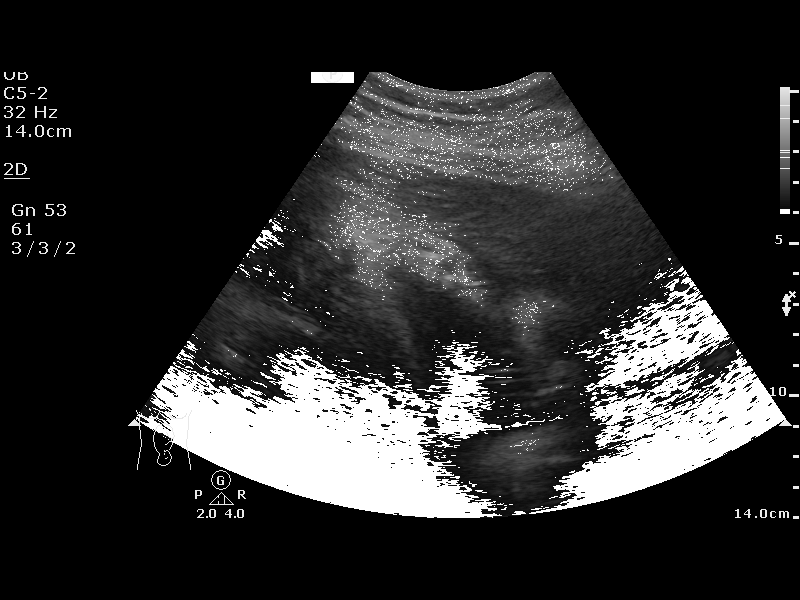
[im 5/11]
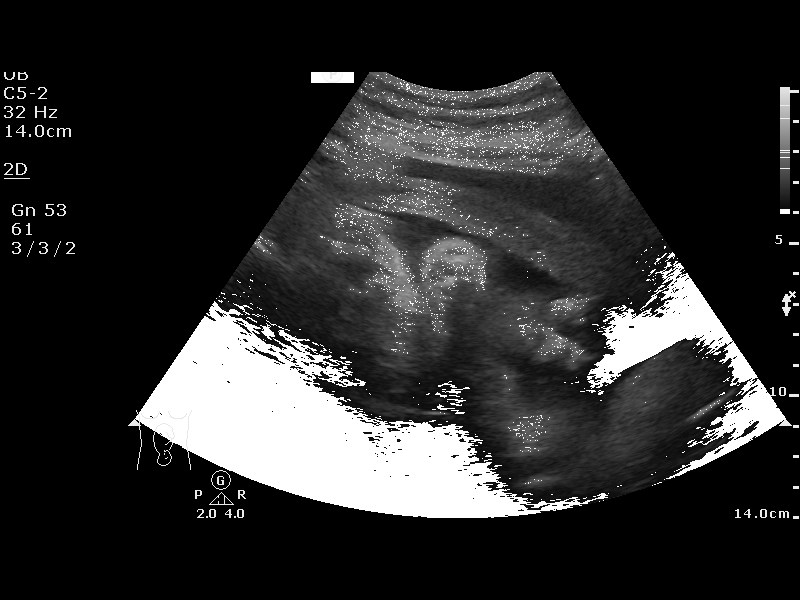
[im 6/11]
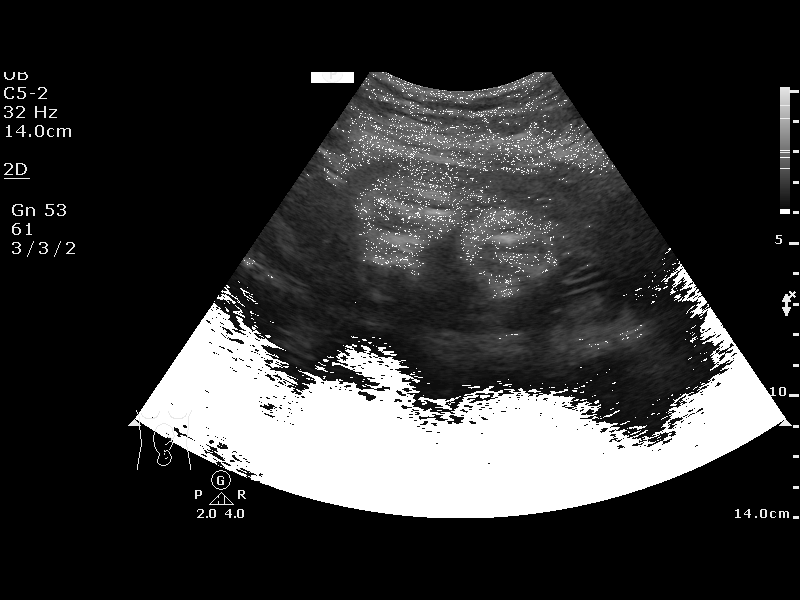
[im 7/11]
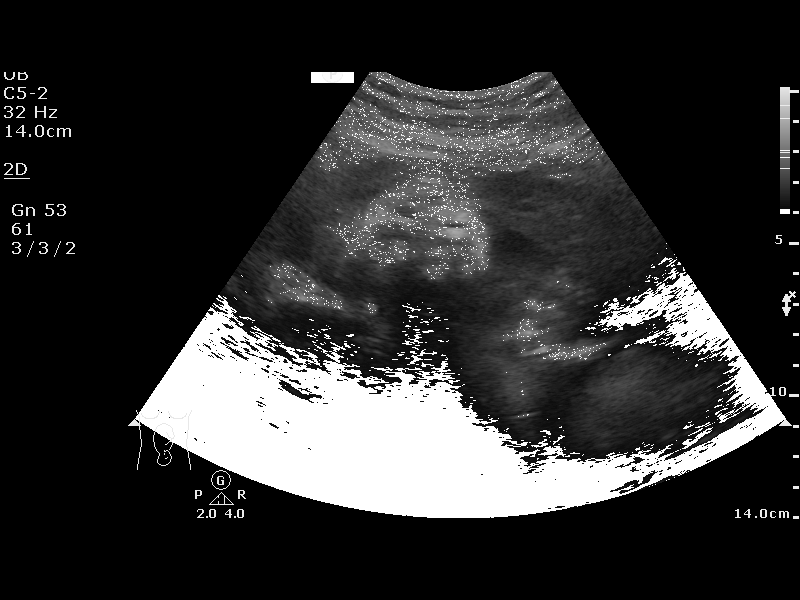
[im 8/11]
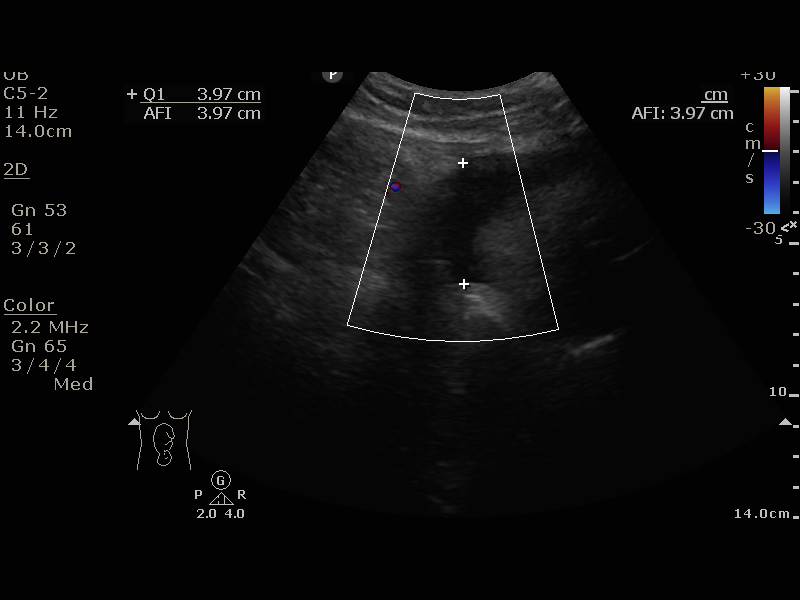
[im 9/11]
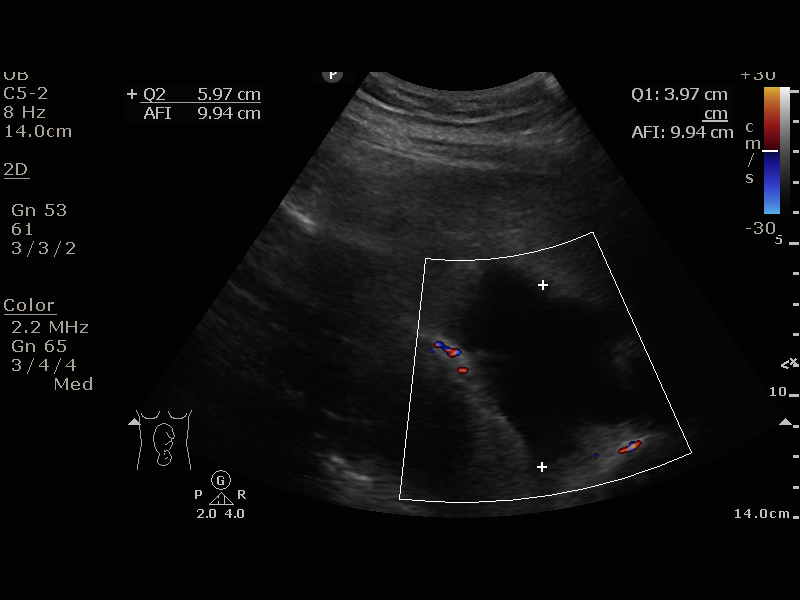
[im 10/11]
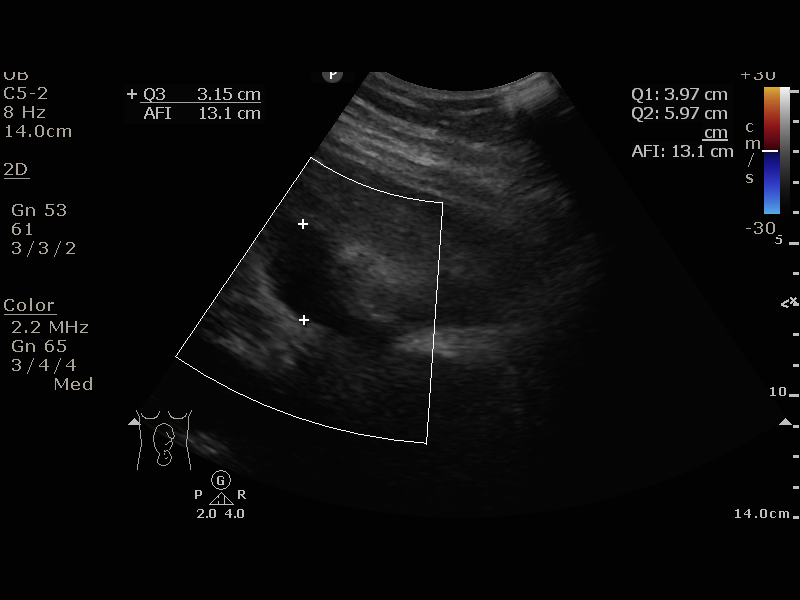
[im 11/11]
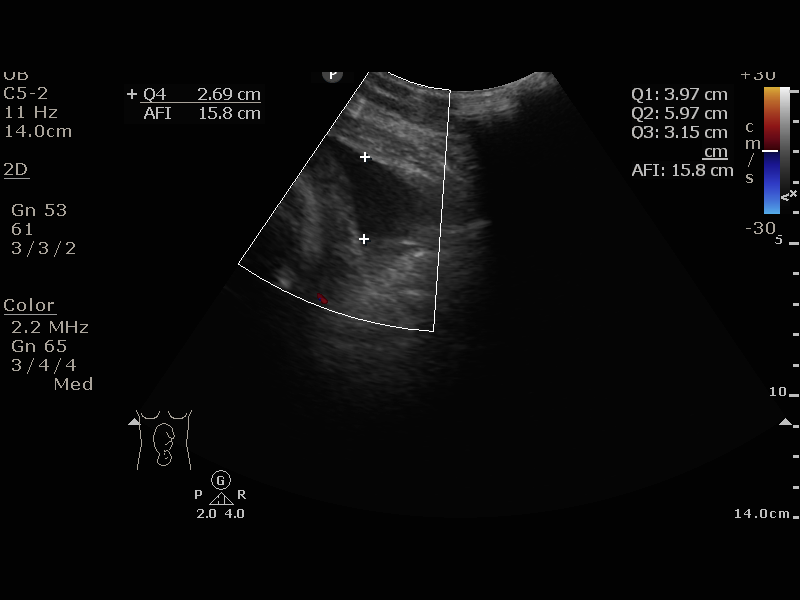

[11 of 11 positions shown; findings below may reference images not displayed]

BSN
                                                            OB/Gyn Clinic
                   Women's                                  [REDACTED]
                   Healthcare
                   OB/Gyn Clinic
                   [SP]

  1  US FETAL BPP W/NONSTRESS             76818.4      [id number]
 ----------------------------------------------------------------------

 ----------------------------------------------------------------------
Indications

  33 weeks gestation of pregnancy
  Pre-existing essential hypertension            [SP]
  complicating pregnancy, third trimester
  Pre-existing diabetes, type 1, in pregnancy,   [SP]
  third trimester

 ----------------------------------------------------------------------
Fetal Evaluation

 Num Of Fetuses:         1
 Preg. Location:         Intrauterine
 Cardiac Activity:       Observed
 Fetal Lie:              Maternal right side
 Presentation:           Cephalic

 Amniotic Fluid
 AFI FV:      Within normal limits

 AFI Sum(cm)     %Tile       Largest Pocket(cm)
 15.78           57
 RUQ(cm)       RLQ(cm)       LUQ(cm)        LLQ(cm)


 Comment:    Breathing noted intermittently, but not sustained.
Biophysical Evaluation

 Amniotic F.V:   Pocket => 2 cm two         F. Tone:        Observed
                 planes
 F. Movement:    Observed                   N.S.T:          Reactive
 F. Breathing:   Not Observed               Score:          [DATE]
OB History

 Gravidity:    1         Term:   0        Prem:   0        SAB:   0
 TOP:          0       Ectopic:  0        Living: 0
Gestational Age

 Best:          33w 5d     Det. By:  Early Ultrasound         EDD:   [DATE]
                                     ([DATE])
Impression

 BPP [DATE] (-2  for breathing)
 Vertex
Recommendations

 Continue with antenatal testing as indicated

## 2017-12-12 NOTE — Progress Notes (Signed)
Pt informed that the ultrasound is considered a limited OB ultrasound and is not intended to be a complete ultrasound exam.  Patient also informed that the ultrasound is not being completed with the intent of assessing for fetal or placental anomalies or any pelvic abnormalities.  Explained that the purpose of today's ultrasound is to assess for  BPP, presentation and AFI.  Patient acknowledges the purpose of the exam and the limitations of the study.    Patient denies headaches, dizziness or blurry vision. Called Dr Rip Harbour regarding patient's BP who states, no intervention needed at this time as patient is asymptomatic.  Koren Bound RN BSN 12/12/17

## 2017-12-13 ENCOUNTER — Ambulatory Visit (INDEPENDENT_AMBULATORY_CARE_PROVIDER_SITE_OTHER): Payer: BC Managed Care – PPO | Admitting: Ophthalmology

## 2017-12-13 ENCOUNTER — Encounter (INDEPENDENT_AMBULATORY_CARE_PROVIDER_SITE_OTHER): Payer: Self-pay | Admitting: Ophthalmology

## 2017-12-13 ENCOUNTER — Ambulatory Visit (HOSPITAL_COMMUNITY): Payer: BC Managed Care – PPO

## 2017-12-13 DIAGNOSIS — I10 Essential (primary) hypertension: Secondary | ICD-10-CM

## 2017-12-13 DIAGNOSIS — H34813 Central retinal vein occlusion, bilateral, with macular edema: Secondary | ICD-10-CM

## 2017-12-13 DIAGNOSIS — H3581 Retinal edema: Secondary | ICD-10-CM | POA: Diagnosis not present

## 2017-12-13 DIAGNOSIS — E103513 Type 1 diabetes mellitus with proliferative diabetic retinopathy with macular edema, bilateral: Secondary | ICD-10-CM | POA: Diagnosis not present

## 2017-12-13 DIAGNOSIS — H35033 Hypertensive retinopathy, bilateral: Secondary | ICD-10-CM

## 2017-12-13 DIAGNOSIS — Z3A33 33 weeks gestation of pregnancy: Secondary | ICD-10-CM

## 2017-12-13 DIAGNOSIS — E103313 Type 1 diabetes mellitus with moderate nonproliferative diabetic retinopathy with macular edema, bilateral: Secondary | ICD-10-CM

## 2017-12-13 NOTE — Progress Notes (Signed)
[facility] Note  12/13/2017     CHIEF COMPLAINT Patient presents for Retina Follow Up   HISTORY OF PRESENT ILLNESS: Barbara Donovan is a 27 y.o. female who presents to the clinic today for:   HPI    Retina Follow Up    Patient presents with  Diabetic Retinopathy.  In left eye.  This started months ago.  Severity is moderate.  Duration of 1 day.  Since onset it is stable.  I, the attending physician,  performed the HPI with the patient and updated documentation appropriately.          Comments    27 y/o female pt returning after 1 day for dilation and PRP OS.  No change in [place] OU.  Denies pain, flashes, floaters.  No gtts.       Last edited by Bernarda Caffey, MD on 12/13/2017  4:26 PM. (History)       Referring physician: No referring provider defined for this encounter.  HISTORICAL INFORMATION:   Selected notes from the MEDICAL RECORD NUMBER Referred by Dr. [name] for concern of bilateral CRVO LEE-  Ocular Hx-  PMH-     CURRENT MEDICATIONS: No current outpatient medications on file. (Ophthalmic Drugs)   No current facility-administered medications for this visit.  (Ophthalmic Drugs)   Current Outpatient Medications (Other)  Medication Sig  . aspirin 81 MG chewable tablet Chew 81 mg by mouth daily.   . Blood Pressure Monitoring (BLOOD PRESSURE CUFF) MISC 1 kit by Does not apply route daily. (Patient not taking: Reported on 11/28/2017)  . famotidine (PEPCID) 20 MG tablet Take 20 mg by mouth daily as needed for heartburn.   . insulin aspart (NOVOLOG FLEXPEN) 100 UNIT/ML FlexPen Inject 7 Units into the skin 3 (three) times daily with meals.  . Insulin Glargine (BASAGLAR KWIKPEN) 100 UNIT/ML SOPN Inject 0.25 mLs (25 Units total) into the skin at bedtime.  Marland Kitchen labetalol (NORMODYNE) 300 MG tablet Take 2 tablets (600 mg total) by mouth 3 (three) times daily.  Marland Kitchen NIFEdipine (PROCARDIA XL/NIFEDICAL XL) 60 MG 24 hr tablet Take 60 mg by mouth 2 (two)  times daily.  . prochlorperazine (COMPAZINE) 10 MG tablet Take 1 tablet (10 mg total) by mouth every 6 (six) hours as needed for nausea or vomiting. (Patient not taking: Reported on 11/14/2017)  . promethazine (PHENERGAN) 25 MG suppository Place 1 suppository (25 mg total) rectally every 6 (six) hours as needed for nausea. (Patient not taking: Reported on 11/14/2017)  . Pyridoxine HCl (B-6 PO) Take 1 tablet by mouth daily.    No current facility-administered medications for this visit.  (Other)      REVIEW OF SYSTEMS: ROS    Positive for: Endocrine, Cardiovascular, Eyes   Negative for: Constitutional, Gastrointestinal, Neurological, Skin, Genitourinary, Musculoskeletal, HENT, Respiratory, Psychiatric, Allergic/Imm, Heme/Lymph   Last edited by Matthew Folks, COA on 12/13/2017  2:17 PM. (History)       ALLERGIES No Known Allergies  PAST MEDICAL HISTORY Past Medical History:  Diagnosis Date  . Diabetes ([place])    TYPE I  . Hypertension complicating pregnancy    Past Surgical History:  Procedure Laterality Date  . NO PAST SURGERIES    . WISDOM TOOTH EXTRACTION      FAMILY HISTORY Family History  Problem Relation Age of Onset  . Diabetes Mother   . Diabetes Father   . Cataracts Father   . Amblyopia Neg Hx   . Blindness Neg Hx   .  Glaucoma Neg Hx   . Macular degeneration Neg Hx   . Retinal detachment Neg Hx   . Strabismus Neg Hx   . Retinitis pigmentosa Neg Hx     SOCIAL HISTORY Social History   Tobacco Use  . Smoking status: Never Smoker  . Smokeless tobacco: Never Used  Substance Use Topics  . Alcohol use: Not Currently  . Drug use: Never         OPHTHALMIC EXAM:  Base Eye Exam    Visual Acuity (Snellen - Linear)      Right Left   Dist cc 20/60 20/80 +2   Dist ph cc NI NI   Correction:  Glasses       Tonometry (Tonopen, 2:26 PM)      Right Left   Pressure  12       Pupils      Dark Light Shape React APD   Right 4 3 Round Brisk None   Left  5 5 Round Minimal None       Visual Fields (Counting fingers)      Left Right    Full Full       Extraocular Movement      Right Left    Full, Ortho Full, Ortho       Neuro/Psych    Oriented x3:  Yes   Mood/Affect:  Normal       Dilation    Left eye:  1.0% Mydriacyl, 2.5% Phenylephrine @ 2:26 PM        Slit Lamp and Fundus Exam    Slit Lamp Exam      Right Left   Lids/Lashes Normal Normal   Conjunctiva/Sclera melanosis White and quiet   Cornea Clear Clear   Anterior Chamber Deep and quiet Deep and quiet   Iris Round and moderately dilated, No NVI Round and moderately dilated, No NVI   Lens trace Nuclear sclerosis and cortical changes Trace Nuclear sclerosis and cortical changes   Vitreous Normal Normal       Fundus Exam      Right Left   Disc Pink and Sharp; +SVP Pink and Sharp, large pre-retinal heme off superior disc   C/D Ratio 0.5, no NVD 0.4   Macula Blunted foveal reflex, +central edema, +scattered IRH,  +ERM Blunted foveal reflex, central edema, +CWS, +NV   Vessels Tortuous, Copper wiring, AV crossing changes, veins dilated, +NV Tortuous, Copper wiring, AV crossing changes, veins dilated, +NV   Periphery Attached, 360 IRH about the disc, scattered CWS posteriorly greatest nasally, IRH does not spread anteriorly, scattered DBH greater posteriorly. Attached, 360 IRH about the disc, scattered CWS posteriorly, IRH does not spread anteriorly, CWS nasal to disc          IMAGING AND PROCEDURES  Imaging and Procedures for _0 @  Panretinal Photocoagulation - OS - Left Eye       LASER PROCEDURE NOTE  Diagnosis:   Proliferative Diabetic Retinopathy, LEFT EYE  Procedure:  Pan-retinal photocoagulation using slit lamp laser, LEFT EYE  Anesthesia:  Topical  Surgeon: Bernarda Caffey, MD, PhD   Informed consent obtained, operative eye marked, and time out performed prior to initiation of laser.   Lumenis [id number] slit lamp laser Pattern: 3x3 square Power:  270 mW Duration: 30 msec  Spot size: 200 microns  # spots: [id number] spots  Complications: None.  RTC: 1-2 wks for PRP, contralateral eye (OD)  Patient tolerated the procedure well and received written and verbal post-procedure care information/education.  ASSESSMENT/PLAN:    ICD-10-CM   1. 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 Retinal edema H35.81   3. Central retinal vein occlusion with macular edema of both eyes H34.8130   4. Essential hypertension I10   5. Hypertensive retinopathy of both eyes H35.033   6. [redacted] weeks gestation of pregnancy Z3A.33   7. Type 1 diabetes mellitus with moderate nonproliferative retinopathy of both eyes and macular edema ([place]) E10.3313     1,2. Type 1 DM with Proliferative diabetic retinopathy and DME OU - The incidence, risk factors for progression, natural history and treatment options for diabetic retinopathy were discussed with patient.   - The need for close monitoring of blood glucose, blood pressure, and serum lipids, avoiding cigarette or any type of tobacco, and the need for long term follow up was also discussed with patient. - exam shows scattered NVE OU and persistent DME OU - OCT shows macular edema, both eyes -- slightly improved from prior (08/03/17) - BCVA down to 20/60 OD, 20/80 OS - discussed findings and prognosis - recommend PRP OU, OS first today, 11.26.19 - f/u 1-2 weeks, PRP OD, POV OS  3. 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 - BP elevated to 180s / 100s at Dr. [name] office on day of initial presentation here - patient on two new blood pressure meds,checked BP today in our office and was 130 / 72 today - concern for pregnancy-related vasculopathy, coagulopathy -- pt was sent to [facility] triage and was discharged with no work up initiated once BP normalized - discussed treatment options and unknown risks of intravitreal injection to fetus -- recommend observation for now -- pt in agreement  4,5. Hypertensive retinopathy OU - concern for pregnancy related hypertension (I.e. Pre-eclampsia, Eclampsia, HELLP, etc) - BP improved today on new meds, which is likely allowed for fsome improvement in macular edema - discussed importance of tight BP control - monitor  3. [redacted] wks gestation pregnancy - pt reports that she may be induced around 37 wks due to erratic BPs    Ophthalmic Meds Ordered this visit:  No orders of the defined types were placed in this encounter.      Return for 1-2 wks for Dilated Exam, OCT, Laser PRP OD.  There are no Patient Instructions on file for this visit.   Explained the diagnoses, plan, and follow up with the patient and they expressed understanding.  Patient expressed understanding of the importance of proper follow up care.  This document serves as a record of services personally performed by Gardiner Sleeper, MD, PhD. It was created on their behalf by Ernest Mallick, OA, an ophthalmic assistant. The creation of this record is the provider's dictation and/or activities during the visit.    Electronically signed by: Ernest Mallick, OA  11.26.19 4:41 PM   Gardiner Sleeper, M.D., Ph.D. Diseases & Surgery of the Retina and Vitreous [facility]   I have reviewed the above documentation for accuracy and completeness, and I agree with the above. Gardiner Sleeper, M.D.,  Ph.D. 12/13/17 4:41 PM     Abbreviations: M myopia  (nearsighted); A astigmatism; H hyperopia (farsighted); P presbyopia; Mrx spectacle prescription;  CTL contact lenses; OD right eye; OS left eye; OU both eyes  XT exotropia; ET esotropia; PEK punctate epithelial keratitis; PEE punctate epithelial erosions; DES dry eye syndrome; MGD meibomian gland dysfunction; ATs artificial tears; PFAT's preservative free artificial tears; [place] nuclear sclerotic cataract; PSC posterior subcapsular cataract; ERM epi-retinal membrane; PVD posterior vitreous detachment; RD retinal detachment; DM diabetes mellitus; DR diabetic retinopathy; NPDR non-proliferative diabetic retinopathy; PDR proliferative diabetic retinopathy; CSME clinically significant macular edema; DME diabetic macular edema; dbh dot blot hemorrhages; CWS cotton wool spot; POAG primary open angle glaucoma; C/D cup-to-disc ratio; HVF humphrey visual field; GVF goldmann visual field; OCT optical coherence tomography; IOP intraocular pressure; BRVO Branch retinal vein occlusion; CRVO central retinal vein occlusion; CRAO central retinal artery occlusion; BRAO branch retinal artery occlusion; RT retinal tear; SB scleral buckle; PPV pars plana vitrectomy; VH Vitreous hemorrhage; PRP panretinal laser photocoagulation; IVK intravitreal kenalog; VMT vitreomacular traction; MH Macular hole;  NVD neovascularization of the disc; NVE neovascularization elsewhere; AREDS age related eye disease study; ARMD age related macular degeneration; POAG primary open angle glaucoma; EBMD epithelial/anterior basement membrane dystrophy; ACIOL anterior chamber intraocular lens; IOL intraocular lens; PCIOL posterior chamber intraocular lens; Phaco/IOL phacoemulsification with intraocular lens placement; [place] photorefractive keratectomy; LASIK laser assisted in situ keratomileusis; HTN hypertension; DM diabetes mellitus; COPD chronic obstructive pulmonary disease

## 2017-12-20 ENCOUNTER — Ambulatory Visit (HOSPITAL_COMMUNITY): Payer: BC Managed Care – PPO

## 2017-12-22 ENCOUNTER — Ambulatory Visit (INDEPENDENT_AMBULATORY_CARE_PROVIDER_SITE_OTHER): Payer: BC Managed Care – PPO | Admitting: Family Medicine

## 2017-12-22 ENCOUNTER — Ambulatory Visit (HOSPITAL_BASED_OUTPATIENT_CLINIC_OR_DEPARTMENT_OTHER)
Admission: RE | Admit: 2017-12-22 | Discharge: 2017-12-22 | Disposition: A | Discharge status: Home / Self Care | Payer: BC Managed Care – PPO | Source: Ambulatory Visit | Attending: Maternal & Fetal Medicine | Admitting: Maternal & Fetal Medicine

## 2017-12-22 ENCOUNTER — Other Ambulatory Visit: Payer: Self-pay

## 2017-12-22 ENCOUNTER — Encounter (HOSPITAL_COMMUNITY): Payer: Self-pay | Admitting: *Deleted

## 2017-12-22 ENCOUNTER — Encounter (HOSPITAL_COMMUNITY): Payer: Self-pay

## 2017-12-22 ENCOUNTER — Ambulatory Visit (INDEPENDENT_AMBULATORY_CARE_PROVIDER_SITE_OTHER): Payer: BC Managed Care – PPO | Admitting: General Practice

## 2017-12-22 ENCOUNTER — Inpatient Hospital Stay (HOSPITAL_COMMUNITY)
Admission: AD | Admit: 2017-12-22 | Discharge: 2017-12-29 | DRG: 787 | Disposition: A | Discharge status: Home / Self Care | Payer: BC Managed Care – PPO | Attending: Obstetrics & Gynecology | Admitting: Obstetrics & Gynecology

## 2017-12-22 VITALS — BP 152/87 | HR 85

## 2017-12-22 DIAGNOSIS — E1022 Type 1 diabetes mellitus with diabetic chronic kidney disease: Secondary | ICD-10-CM | POA: Diagnosis present

## 2017-12-22 DIAGNOSIS — O24019 Pre-existing diabetes mellitus, type 1, in pregnancy, unspecified trimester: Secondary | ICD-10-CM

## 2017-12-22 DIAGNOSIS — E1065 Type 1 diabetes mellitus with hyperglycemia: Secondary | ICD-10-CM | POA: Diagnosis present

## 2017-12-22 DIAGNOSIS — O10919 Unspecified pre-existing hypertension complicating pregnancy, unspecified trimester: Secondary | ICD-10-CM

## 2017-12-22 DIAGNOSIS — O0993 Supervision of high risk pregnancy, unspecified, third trimester: Secondary | ICD-10-CM | POA: Diagnosis not present

## 2017-12-22 DIAGNOSIS — Z3A35 35 weeks gestation of pregnancy: Secondary | ICD-10-CM | POA: Insufficient documentation

## 2017-12-22 DIAGNOSIS — O2402 Pre-existing diabetes mellitus, type 1, in childbirth: Secondary | ICD-10-CM | POA: Diagnosis present

## 2017-12-22 DIAGNOSIS — F329 Major depressive disorder, single episode, unspecified: Secondary | ICD-10-CM | POA: Diagnosis not present

## 2017-12-22 DIAGNOSIS — O211 Hyperemesis gravidarum with metabolic disturbance: Secondary | ICD-10-CM

## 2017-12-22 DIAGNOSIS — J45909 Unspecified asthma, uncomplicated: Secondary | ICD-10-CM | POA: Diagnosis present

## 2017-12-22 DIAGNOSIS — J4520 Mild intermittent asthma, uncomplicated: Secondary | ICD-10-CM

## 2017-12-22 DIAGNOSIS — O358XX0 Maternal care for other (suspected) fetal abnormality and damage, not applicable or unspecified: Secondary | ICD-10-CM | POA: Diagnosis present

## 2017-12-22 DIAGNOSIS — Z794 Long term (current) use of insulin: Secondary | ICD-10-CM

## 2017-12-22 DIAGNOSIS — O99343 Other mental disorders complicating pregnancy, third trimester: Secondary | ICD-10-CM

## 2017-12-22 DIAGNOSIS — O365990 Maternal care for other known or suspected poor fetal growth, unspecified trimester, not applicable or unspecified: Secondary | ICD-10-CM

## 2017-12-22 DIAGNOSIS — E103413 Type 1 diabetes mellitus with severe nonproliferative diabetic retinopathy with macular edema, bilateral: Secondary | ICD-10-CM

## 2017-12-22 DIAGNOSIS — O359XX0 Maternal care for (suspected) fetal abnormality and damage, unspecified, not applicable or unspecified: Secondary | ICD-10-CM

## 2017-12-22 DIAGNOSIS — O10913 Unspecified pre-existing hypertension complicating pregnancy, third trimester: Secondary | ICD-10-CM | POA: Diagnosis not present

## 2017-12-22 DIAGNOSIS — O24919 Unspecified diabetes mellitus in pregnancy, unspecified trimester: Secondary | ICD-10-CM | POA: Diagnosis present

## 2017-12-22 DIAGNOSIS — O1203 Gestational edema, third trimester: Secondary | ICD-10-CM

## 2017-12-22 DIAGNOSIS — O99340 Other mental disorders complicating pregnancy, unspecified trimester: Secondary | ICD-10-CM | POA: Diagnosis present

## 2017-12-22 DIAGNOSIS — O35BXX0 Maternal care for other (suspected) fetal abnormality and damage, fetal cardiac anomalies, not applicable or unspecified: Secondary | ICD-10-CM | POA: Diagnosis present

## 2017-12-22 DIAGNOSIS — R0602 Shortness of breath: Secondary | ICD-10-CM

## 2017-12-22 DIAGNOSIS — E08319 Diabetes mellitus due to underlying condition with unspecified diabetic retinopathy without macular edema: Secondary | ICD-10-CM | POA: Diagnosis not present

## 2017-12-22 DIAGNOSIS — I361 Nonrheumatic tricuspid (valve) insufficiency: Secondary | ICD-10-CM | POA: Diagnosis not present

## 2017-12-22 DIAGNOSIS — O2412 Pre-existing diabetes mellitus, type 2, in childbirth: Secondary | ICD-10-CM | POA: Diagnosis not present

## 2017-12-22 DIAGNOSIS — E108 Type 1 diabetes mellitus with unspecified complications: Secondary | ICD-10-CM

## 2017-12-22 DIAGNOSIS — E1029 Type 1 diabetes mellitus with other diabetic kidney complication: Secondary | ICD-10-CM | POA: Diagnosis present

## 2017-12-22 DIAGNOSIS — O24913 Unspecified diabetes mellitus in pregnancy, third trimester: Secondary | ICD-10-CM

## 2017-12-22 DIAGNOSIS — O24013 Pre-existing diabetes mellitus, type 1, in pregnancy, third trimester: Secondary | ICD-10-CM

## 2017-12-22 DIAGNOSIS — E11319 Type 2 diabetes mellitus with unspecified diabetic retinopathy without macular edema: Secondary | ICD-10-CM | POA: Diagnosis present

## 2017-12-22 DIAGNOSIS — E1021 Type 1 diabetes mellitus with diabetic nephropathy: Secondary | ICD-10-CM | POA: Diagnosis present

## 2017-12-22 DIAGNOSIS — E109 Type 1 diabetes mellitus without complications: Secondary | ICD-10-CM | POA: Diagnosis present

## 2017-12-22 DIAGNOSIS — E10319 Type 1 diabetes mellitus with unspecified diabetic retinopathy without macular edema: Secondary | ICD-10-CM | POA: Diagnosis present

## 2017-12-22 DIAGNOSIS — O365930 Maternal care for other known or suspected poor fetal growth, third trimester, not applicable or unspecified: Secondary | ICD-10-CM | POA: Diagnosis not present

## 2017-12-22 DIAGNOSIS — O0990 Supervision of high risk pregnancy, unspecified, unspecified trimester: Secondary | ICD-10-CM

## 2017-12-22 DIAGNOSIS — O10013 Pre-existing essential hypertension complicating pregnancy, third trimester: Secondary | ICD-10-CM

## 2017-12-22 DIAGNOSIS — R809 Proteinuria, unspecified: Secondary | ICD-10-CM | POA: Diagnosis not present

## 2017-12-22 DIAGNOSIS — F32A Depression, unspecified: Secondary | ICD-10-CM | POA: Diagnosis present

## 2017-12-22 DIAGNOSIS — O1002 Pre-existing essential hypertension complicating childbirth: Secondary | ICD-10-CM | POA: Diagnosis present

## 2017-12-22 DIAGNOSIS — E1121 Type 2 diabetes mellitus with diabetic nephropathy: Secondary | ICD-10-CM | POA: Diagnosis present

## 2017-12-22 HISTORY — DX: Maternal care for other known or suspected poor fetal growth, unspecified trimester, not applicable or unspecified: O36.5990

## 2017-12-22 LAB — COMPREHENSIVE METABOLIC PANEL
ALK PHOS: 64 U/L (ref 38–126)
ALT: 21 U/L (ref 0–44)
AST: 24 U/L (ref 15–41)
Albumin: 1.6 g/dL — ABNORMAL LOW (ref 3.5–5.0)
Anion gap: 8 (ref 5–15)
BUN: 10 mg/dL (ref 6–20)
CO2: 24 mmol/L (ref 22–32)
Calcium: 8.4 mg/dL — ABNORMAL LOW (ref 8.9–10.3)
Chloride: 103 mmol/L (ref 98–111)
Creatinine, Ser: 0.56 mg/dL (ref 0.44–1.00)
GFR calc Af Amer: >60 mL/min (ref >60)
GFR calc non Af Amer: >60 mL/min (ref >60)
Glucose, Bld: 89 mg/dL (ref 70–99)
Potassium: 3.8 mmol/L (ref 3.5–5.1)
SODIUM: 135 mmol/L (ref 135–145)
Total Bilirubin: 0.4 mg/dL (ref 0.3–1.2)
Total Protein: 5.5 g/dL — ABNORMAL LOW (ref 6.5–8.1)

## 2017-12-22 LAB — URINALYSIS, ROUTINE W REFLEX MICROSCOPIC
Bilirubin Urine: NEGATIVE
Glucose, UA: NEGATIVE mg/dL
Hgb urine dipstick: NEGATIVE
Ketones, ur: NEGATIVE mg/dL
LEUKOCYTES UA: NEGATIVE
Nitrite: NEGATIVE
Protein, ur: >=300 mg/dL — AB
Specific Gravity, Urine: 1.012 (ref 1.005–1.030)
pH: 7 (ref 5.0–8.0)

## 2017-12-22 LAB — CBC
HEMATOCRIT: 32.6 % — AB (ref 36.0–46.0)
Hemoglobin: 11.2 g/dL — ABNORMAL LOW (ref 12.0–15.0)
MCH: 28.9 pg (ref 26.0–34.0)
MCHC: 34.4 g/dL (ref 30.0–36.0)
MCV: 84 fL (ref 80.0–100.0)
PLATELETS: 451 10*3/uL — AB (ref 150–400)
RBC: 3.88 MIL/uL (ref 3.87–5.11)
RDW: 13.2 % (ref 11.5–15.5)
WBC: 7.6 10*3/uL (ref 4.0–10.5)
nRBC: 0 % (ref 0.0–0.2)

## 2017-12-22 LAB — ABO/RH: ABO/RH(D): A POS

## 2017-12-22 LAB — POCT URINALYSIS DIP (DEVICE)
BILIRUBIN URINE: NEGATIVE
GLUCOSE, UA: NEGATIVE mg/dL
Ketones, ur: NEGATIVE mg/dL
LEUKOCYTES UA: NEGATIVE
Nitrite: NEGATIVE
Protein, ur: >=300 mg/dL — AB
SPECIFIC GRAVITY, URINE: 1.025 (ref 1.005–1.030)
Urobilinogen, UA: 1 mg/dL (ref 0.0–1.0)
pH: 7 (ref 5.0–8.0)

## 2017-12-22 LAB — GLUCOSE, CAPILLARY
Glucose-Capillary: 82 mg/dL (ref 70–99)
Glucose-Capillary: 116 mg/dL — ABNORMAL HIGH (ref 70–99)

## 2017-12-22 LAB — TYPE AND SCREEN
ABO/RH(D): A POS
Antibody Screen: NEGATIVE

## 2017-12-22 LAB — PROTEIN / CREATININE RATIO, URINE
Creatinine, Urine: 76 mg/dL
PROTEIN CREATININE RATIO: 8.03 mg/mg{creat} — AB (ref 0.00–0.15)
TOTAL PROTEIN, URINE: 610 mg/dL

## 2017-12-22 IMAGING — US US MFM FETAL BPP W/O NON-STRESS
1 series · 13 of 28 positions shown · non-contrast
Comparison: none

[Series 1: us mfm fetal bpp w/o non-stress · 33 acquisitions, 13 frames shown]
[im 2/33]
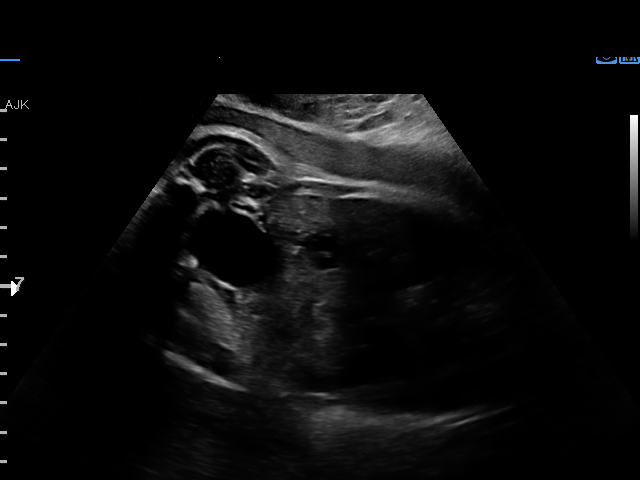
[im 4/33]
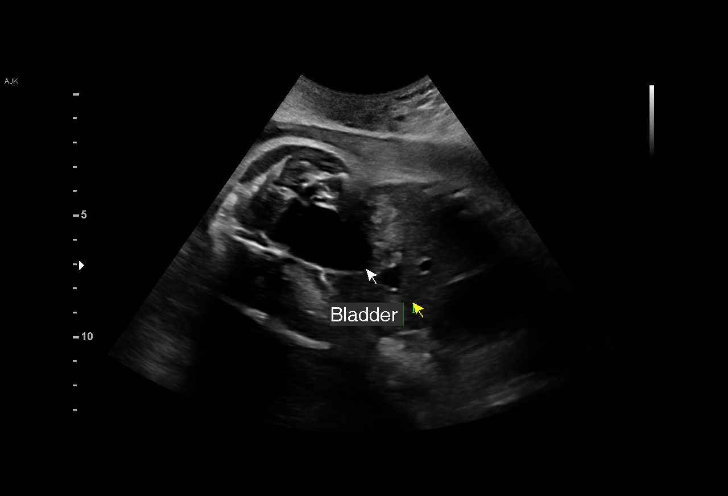
[im 6/33]
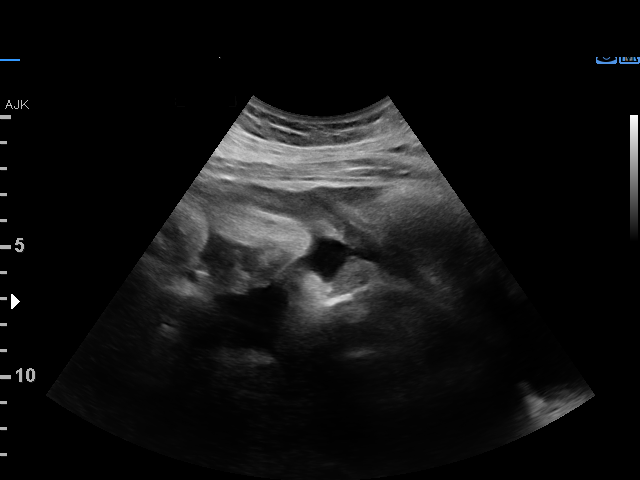
[im 9/33]
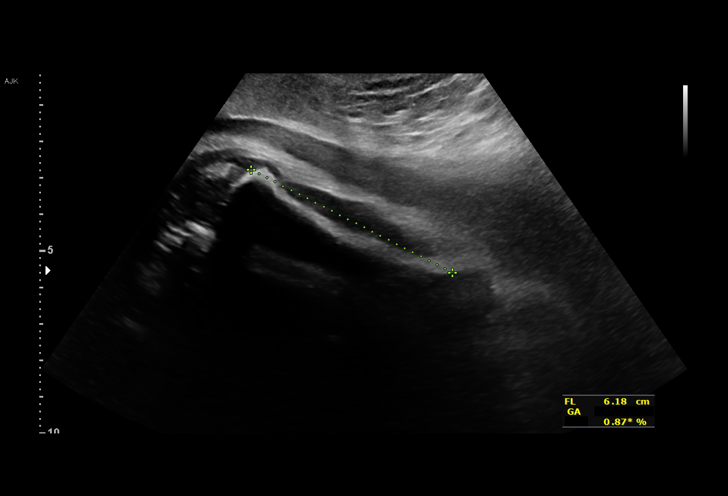
[im 11/33]
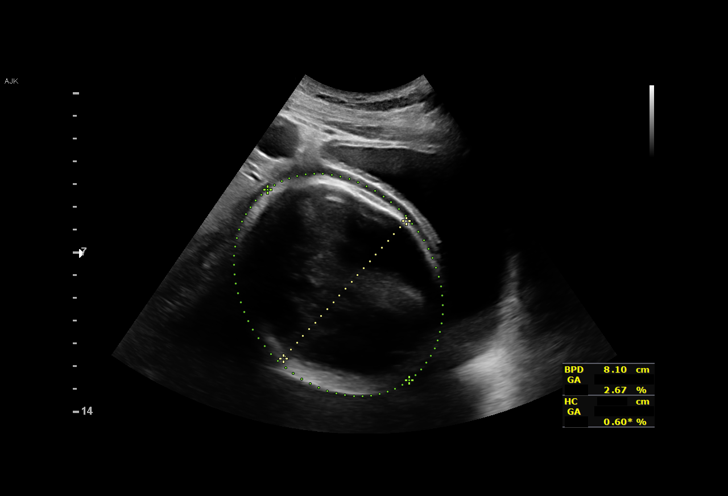
[im 14/33]
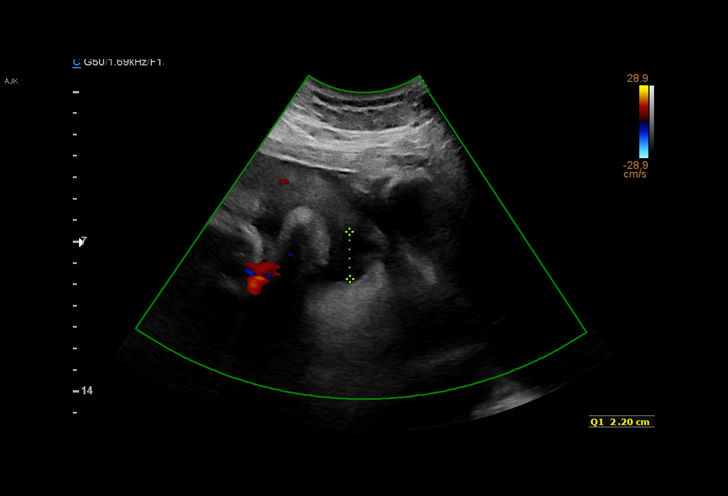
[im 17/33]
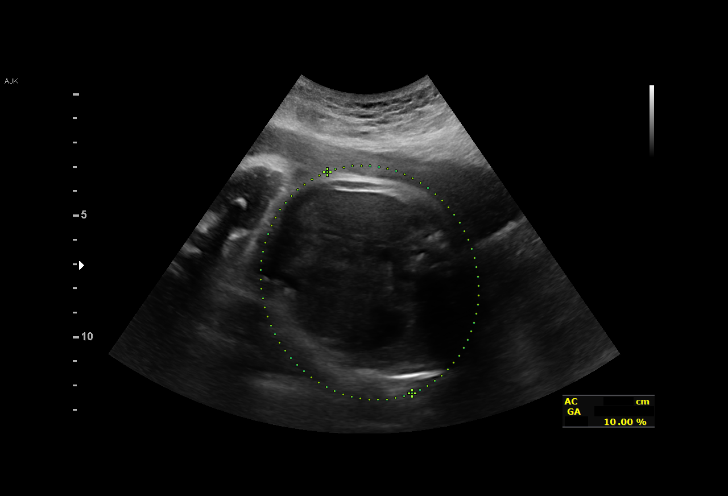
[im 19/33]
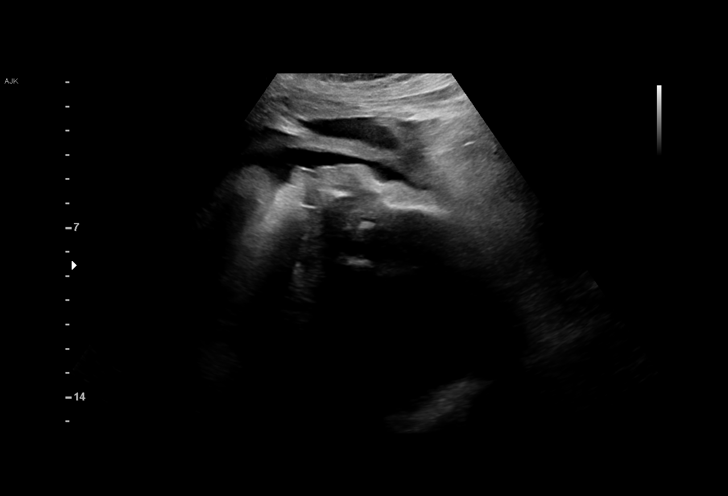
[im 22/33]
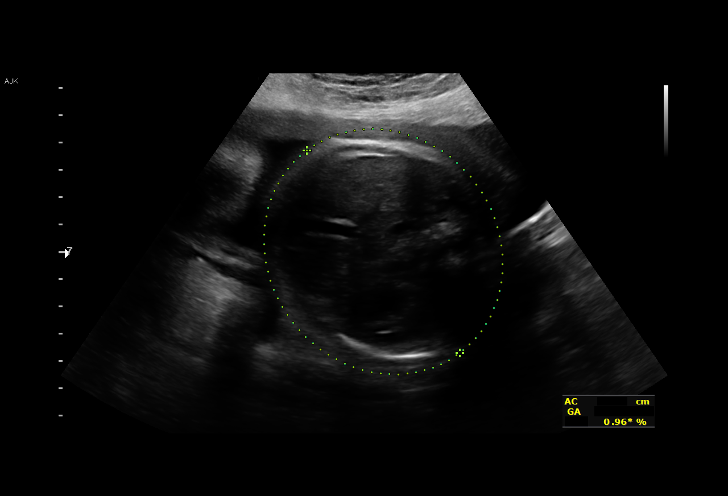
[im 24/33]
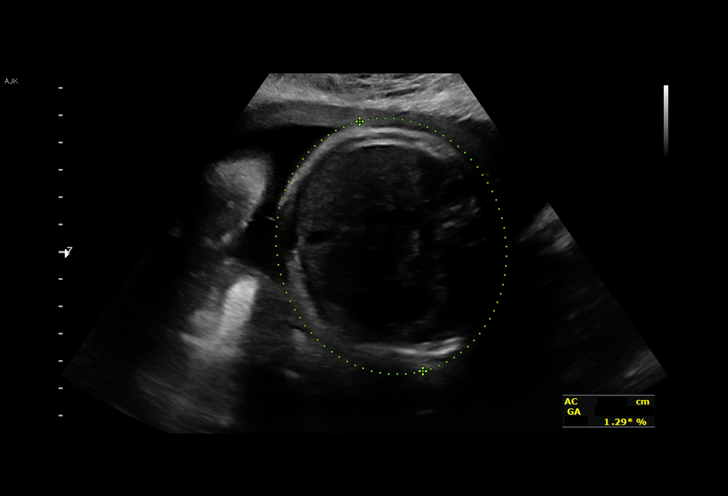
[im 27/33]
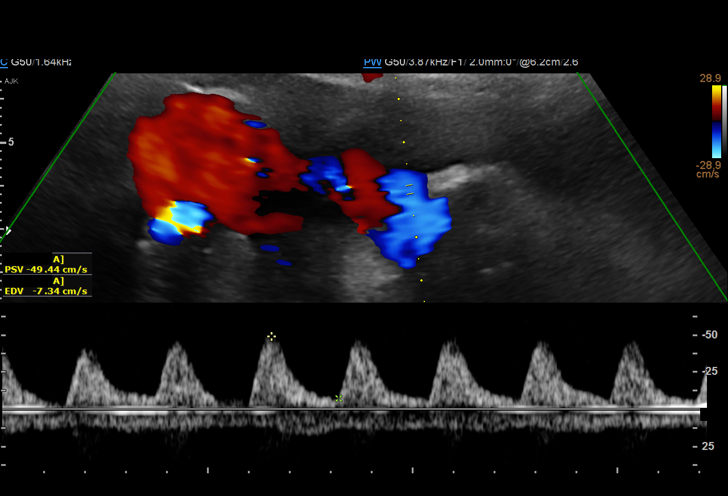
[im 29/33]
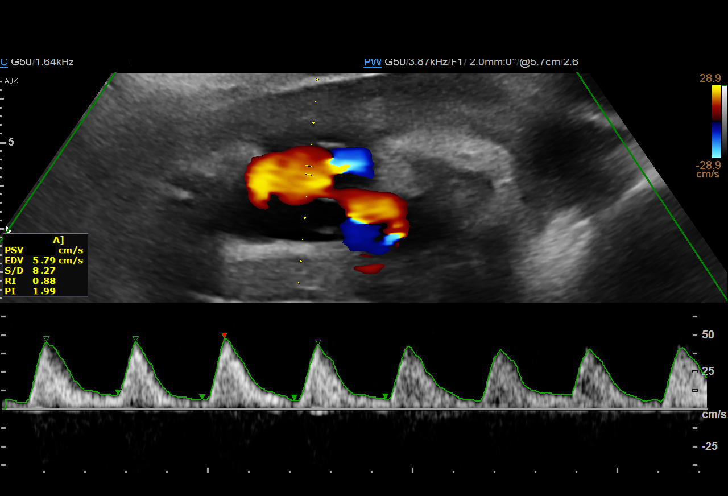
[im 31/33]
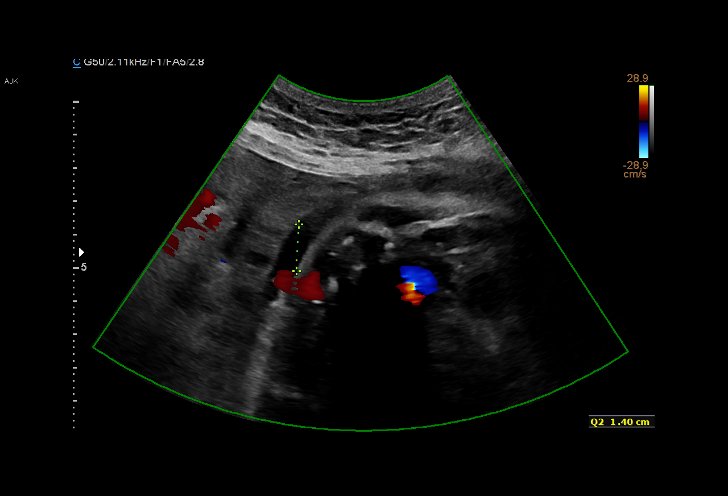

[13 of 28 positions shown; findings below may reference images not displayed]

OB/Gyn Clinic
                   OB/Gyn Clinic
                   [XY]

                                                       ROX
 ----------------------------------------------------------------------

 ----------------------------------------------------------------------
Indications

  Pre-existing essential hypertension            [XY]
  complicating pregnancy, third trimester
  35 weeks gestation of pregnancy
  Pre-existing diabetes, type 1, in pregnancy,   [XY]
  third trimester
 ----------------------------------------------------------------------
Fetal Evaluation

 Num Of Fetuses:         1
 Fetal Heart Rate(bpm):  129
 Cardiac Activity:       Observed
 Presentation:           Cephalic
 Amniotic Fluid
 AFI FV:      Subjectively decreased

 AFI Sum(cm)     %Tile       Largest Pocket(cm)
 5.62            < 3

 RUQ(cm)       RLQ(cm)

Biophysical Evaluation

 Amniotic F.V:   Within normal limits       F. Breathing:   Observed
 F. Movement:    Observed                   Score:          [DATE]
Biometry

 BPD:        81  mm     G. Age:  32w 4d          3  %    CI:        74.57   %    70 - 86
                                                         FL/HC:      20.6   %    20.1 -
 HC:      297.7  mm     G. Age:  33w 0d        < 3  %    HC/AC:      1.06        0.93 -
 AC:      281.3  mm     G. Age:  32w 1d        < 3  %    FL/BPD:     75.8   %    71 - 87
 FL:       61.4  mm     G. Age:  31w 6d        < 3  %    FL/AC:      21.8   %    20 - 24

 Est. FW:    [XY]  gm      4 lb 4 oz     10  %
OB History

 Gravidity:    1         Term:   0        Prem:   0        SAB:   0
 TOP:          0       Ectopic:  0        Living: 0
Gestational Age

 U/S Today:     32w 3d                                        EDD:   [DATE]
 Best:          35w 1d     Det. By:  Early Ultrasound         EDD:   [DATE]
                                     ([DATE])
Anatomy

 Cranium:               Previously seen        Aortic Arch:            Not well visualized
 Cavum:                 Previously seen        Ductal Arch:            Not well visualized
 Ventricles:            Ventriculomegaly       Diaphragm:              Previously seen
 Choroid Plexus:        Dangling Choroid       Stomach:                Previously Seen
 Cerebellum:            Previously seen        Abdomen:                Previously seen
 Posterior Fossa:       Previously seen        Abdominal Wall:         Not well visualized
 Nuchal Fold:           Not applicable (>20    Cord Vessels:           Appears normal (3
                        wks GA)                                        vessel cord)
 Face:                  Orbits and profile     Kidneys:                Abnormal, see
                        previously seen
                                                                       comments
 Lips:                  Previously seen        Bladder:                Appears normal
 Thoracic:              Appears normal         Spine:                  Previously seen
 Heart:                 Not well visualized    Upper Extremities:      Abnormal, see
                                                                       comments
 RVOT:                  Not well visualized    Lower Extremities:      Previously seen
 LVOT:                  Not well visualized

 Other:  Male gender. Technically difficult due to advanced gestational age.
Doppler - Fetal Vessels

 Umbilical Artery
  S/D     %tile     RI              PI                     ADFV    RDFV
 6.78    > 97.5  0.85             2.04                        No      No
Comments

 U/S images reviewed. Findings reviewed with patient.
 Borderline fetal growth is noted (EFW - 10%).  UA dopplers
 are elevated.  No evidence of fetal compromise is found on
 BPP today   Multiple fetal abnormalities are seen.  Maternal
 ascites is seen during today's U/S
 Patient reports increasing shortness of breath and swelling.
 Questions answered.
 10 minutes spent face to face with patient.
 Recommendations: 1) Admission 2) Cardiology Consultation
 3) NICU consultation 3) Twice weekly BPP/NST/UA dopplers
 4) Delivery between 36-37 weeks
Recommendations

 1) Admission 2) Cardiology Consultation 3) NICU
 consultation 3) Twice weekly BPP/NST/UA dopplers 4)
 Delivery between 36-37 weeks

              ROX

## 2017-12-22 MED ORDER — NIFEDIPINE ER 60 MG PO TB24
60.0000 mg | ORAL_TABLET | Freq: Two times a day (BID) | ORAL | Status: DC
  Administered 2017-12-22 – 2017-12-26 (×8): 60 mg via ORAL
  Filled 2017-12-22: qty 1
  Filled 2017-12-22 (×3): qty 2
  Filled 2017-12-22: qty 1
  Filled 2017-12-22 (×4): qty 2
  Filled 2017-12-22: qty 1
  Filled 2017-12-22: qty 2

## 2017-12-22 MED ORDER — ZOLPIDEM TARTRATE 5 MG PO TABS: 5.0000 mg | ORAL_TABLET | Freq: Every evening | ORAL | Status: DC | PRN

## 2017-12-22 MED ORDER — PRENATAL MULTIVITAMIN CH
1.0000 | ORAL_TABLET | Freq: Every day | ORAL | Status: DC
  Administered 2017-12-23 – 2017-12-25 (×3): 1 via ORAL
  Filled 2017-12-22 (×3): qty 1

## 2017-12-22 MED ORDER — VITAMIN B-6 50 MG PO TABS: 50.0000 mg | ORAL_TABLET | Freq: Every day | ORAL | Status: DC

## 2017-12-22 MED ORDER — B-6 100 MG PO TABS: ORAL_TABLET | Freq: Every day | ORAL | Status: DC

## 2017-12-22 MED ORDER — FUROSEMIDE 10 MG/ML IJ SOLN
40.0000 mg | Freq: Two times a day (BID) | INTRAMUSCULAR | Status: AC
  Administered 2017-12-22 – 2017-12-24 (×4): 40 mg via INTRAVENOUS
  Filled 2017-12-22 (×4): qty 4

## 2017-12-22 MED ORDER — INSULIN ASPART 100 UNIT/ML ~~LOC~~ SOLN
7.0000 [IU] | Freq: Three times a day (TID) | SUBCUTANEOUS | Status: DC
  Administered 2017-12-22 – 2017-12-25 (×10): 7 [IU] via SUBCUTANEOUS

## 2017-12-22 MED ORDER — ASPIRIN 81 MG PO CHEW
81.0000 mg | CHEWABLE_TABLET | Freq: Every day | ORAL | Status: DC
  Administered 2017-12-23 – 2017-12-25 (×3): 81 mg via ORAL
  Filled 2017-12-22 (×4): qty 1

## 2017-12-22 MED ORDER — DOCUSATE SODIUM 100 MG PO CAPS
100.0000 mg | ORAL_CAPSULE | Freq: Every day | ORAL | Status: DC
  Administered 2017-12-23 – 2017-12-25 (×3): 100 mg via ORAL
  Filled 2017-12-22 (×3): qty 1

## 2017-12-22 MED ORDER — INSULIN GLARGINE 100 UNIT/ML ~~LOC~~ SOLN
25.0000 [IU] | Freq: Every day | SUBCUTANEOUS | Status: DC
  Administered 2017-12-22 – 2017-12-25 (×4): 25 [IU] via SUBCUTANEOUS
  Filled 2017-12-22 (×5): qty 0.25

## 2017-12-22 MED ORDER — DOXYLAMINE SUCCINATE (SLEEP) 25 MG PO TABS
25.0000 mg | ORAL_TABLET | Freq: Every day | ORAL | Status: DC
  Administered 2017-12-22 – 2017-12-25 (×4): 25 mg via ORAL
  Filled 2017-12-22 (×4): qty 1

## 2017-12-22 MED ORDER — LABETALOL HCL 200 MG PO TABS
600.0000 mg | ORAL_TABLET | Freq: Three times a day (TID) | ORAL | Status: DC
  Administered 2017-12-22 – 2017-12-26 (×12): 600 mg via ORAL
  Filled 2017-12-22: qty 6
  Filled 2017-12-22 (×2): qty 3
  Filled 2017-12-22: qty 6
  Filled 2017-12-22 (×8): qty 3

## 2017-12-22 MED ORDER — ACETAMINOPHEN 325 MG PO TABS: 650.0000 mg | ORAL_TABLET | ORAL | Status: DC | PRN

## 2017-12-22 MED ORDER — FUROSEMIDE 40 MG PO TABS: 40.0000 mg | ORAL_TABLET | Freq: Two times a day (BID) | ORAL | 0 refills | Status: DC

## 2017-12-22 MED ORDER — CALCIUM CARBONATE ANTACID 500 MG PO CHEW: 2.0000 | CHEWABLE_TABLET | ORAL | Status: DC | PRN

## 2017-12-22 MED ORDER — FAMOTIDINE 20 MG PO TABS: 20.0000 mg | ORAL_TABLET | Freq: Every day | ORAL | Status: DC | PRN

## 2017-12-22 MED ORDER — PYRIDOXINE HCL 25 MG PO TABS
50.0000 mg | ORAL_TABLET | Freq: Every day | ORAL | Status: DC
Start: 2017-12-22 — End: 2017-12-26
  Administered 2017-12-22 – 2017-12-25 (×4): 50 mg via ORAL
  Filled 2017-12-22 (×4): qty 2

## 2017-12-22 NOTE — Progress Notes (Signed)
NST performed today & found to be reactive. Patient will follow up next week at scheduled appt.  Koren Bound RN BSN 12/22/17

## 2017-12-22 NOTE — Patient Instructions (Signed)
Lactancia materna Breastfeeding Decidir Economist es una de las mejores elecciones que puede hacer por usted y su beb. Un cambio en las hormonas durante el embarazo hace que las mamas produzcan leche materna en las glndulas productoras de [place]. Las hormonas impiden que la leche materna sea liberada antes del nacimiento del beb. Adems, impulsan el flujo de leche luego del nacimiento. Una vez que ha comenzado a Economist, Freight forwarder beb, as Therapist, occupational succin o Social research officer, government, pueden estimular la liberacin de [place] de las glndulas productoras de [place]. Los beneficios de [employer] investigaciones demuestran que la lactancia materna ofrece muchos beneficios de salud para bebs y [place]. [place]ems, ofrece una forma gratuita y conveniente de Research scientist (life sciences) al beb. Para el beb  La primera leche (calostro) ayuda a Garment/textile technologist funcionamiento del aparato digestivo del beb.  Las clulas especiales de la leche (anticuerpos) ayudan a Radio broadcast assistant las infecciones en el beb.  Los bebs que se alimentan con leche materna tambin tienen menos probabilidades de tener asma, alergias, obesidad o diabetes de tipo 2. Adems, tienen menor riesgo de sufrir el sndrome de muerte sbita del lactante (SMSL).  [place] son mejores para Engineer, water las necesidades del beb en comparacin con la Humana Inc.  La leche materna mejora el desarrollo cerebral del beb. Para usted  La lactancia materna favorece el desarrollo de un vnculo muy especial entre la madre y el beb.  Es conveniente. La leche materna es econmica y siempre est disponible a la Tree surgeon.  La lactancia materna ayuda a quemar caloras. Wyatt Mage a perder el peso ganado durante el [place].  Hace que el tero vuelva al tamao que tena antes del embarazo ms rpido. Adems, disminuye el sangrado (loquios) despus del parto.  La lactancia materna contribuye a reducir Catering manager de tener diabetes de tipo 2,  osteoporosis, artritis reumatoide, enfermedades cardiovasculares y cncer de mama, ovario, tero y endometrio en el futuro. Informacin bsica sobre la lactancia Comienzo de la lactancia  Encuentre un lugar cmodo para sentarse o Acupuncturist, con un buen respaldo para el cuello y la espalda.  Coloque una almohada o una manta enrollada debajo del beb para acomodarlo a la altura de la mama (si est sentada). Las almohadas para Economist se han diseado especialmente a fin de servir de apoyo para los brazos y el beb Kellogg.  Asegrese de que la barriga del beb (abdomen) est frente a la suya.  [place] suavemente la mama. Con las yemas de los dedos, Apple Computer bordes exteriores de la mama hacia adentro, en direccin al pezn. Esto estimula el flujo de [place]. Si la [employer], es posible que deba Clinical biochemist con este movimiento durante la Transport planner.  Sostenga la mama con 4 dedos por debajo y Counselling psychologist por arriba del pezn (forme la letra "C" con la mano). Asegrese de que los dedos se encuentren lejos del pezn y de la boca del beb.  Empuje suavemente los labios del beb con el pezn o con el dedo.  Cuando la boca del beb se abra lo suficiente, acrquelo rpidamente a la mama e introduzca todo el pezn y la arola, tanto como sea posible, dentro de la boca del beb. La arola es la zona de color que rodea al pezn. ? Debe haber ms arola visible por arriba del labio superior del beb que por debajo del labio inferior. ? Los labios del beb deben estar abiertos y extendidos hacia afuera (evertidos) para asegurar que  el beb se prenda de forma [place] y cmoda. ? La lengua del beb debe estar entre la enca inferior y Scientist, research (medical).  Asegrese de que la boca del beb est en la posicin correcta alrededor del pezn (prendido). Los labios del beb deben crear un sello sobre la mama y estar doblados hacia afuera (invertidos).  Es comn que el beb succione durante 2 a 3 minutos  para que comience el flujo de [place]. Cmo debe prenderse Es muy importante que le ensee al beb cmo prenderse adecuadamente a la mama. Si el beb no se prende adecuadamente, puede causar DTE Energy Company, reducir la produccin de [place] materna y Field seismologist que el beb tenga un escaso aumento de [place]. Adems, si el beb no se prende adecuadamente al pezn, puede tragar aire durante la alimentacin. Esto puede causarle molestias al beb. Hacer eructar al beb al Eliezer Lofts de mama puede ayudarlo a liberar el aire. Sin embargo, ensearle al beb cmo prenderse a la mama adecuadamente es la mejor manera de evitar que se sienta molesto por tragar Administrator, sports se alimenta. Signos de que el beb se ha prendido adecuadamente al pezn  Tironea o succiona de modo silencioso, sin Education administrator. Los labios del beb deben estar extendidos hacia afuera (evertidos).  Se escucha que traga cada 3 o 4 succiones una vez que la Northeast Utilities ha comenzado a Airline pilot (despus de que se produzca el reflejo de eyeccin de la [place]).  Hay movimientos musculares por arriba y por delante de sus odos al Mining engineer.  Signos de que el beb no se ha prendido Product manager al pezn  Hace ruidos de succin o de chasquido mientras se Haematologist.  Siente dolor en los pezones.  Si cree que el beb no se prendi correctamente, deslice el dedo en la comisura de la boca y Micron Technology las encas del beb para interrumpir la succin. Intente volver a comenzar a Economist. Signos de Transport planner materna exitosa Signos del beb  El beb disminuir gradualmente el nmero de succiones o dejar de succionar por completo.  El beb se quedar dormido.  El cuerpo del beb se relajar.  El beb retendr Ardelia Mems pequea cantidad de [employer] boca.  El beb se desprender solo del [place].  Signos que presenta usted  Las mamas han aumentado la firmeza, el peso y el tamao 1 a 3 horas despus de Economist.  Estn ms blandas inmediatamente  despus de amamantar.  Se producen un aumento del volumen de [place] y un cambio en su consistencia y color [place].  Los pezones no duelen, no estn agrietados ni sangran.  Signos de que su beb recibe la cantidad de leche suficiente  Mojar por lo menos 1 o 2paales durante las primeras 24horas despus del nacimiento.  Mojar por lo menos 5 o 6paales cada 24horas durante la primera semana despus del nacimiento. La orina debe ser clara o de color amarillo plido a los 5das de vida.  Mojar entre 6 y 8paales cada 24horas a medida que el beb sigue creciendo y desarrollndose.  Defeca por lo menos 3 veces en 24 horas a los 5 das de vida. Las heces deben ser blandas y Careers adviser.  Defeca por lo menos 3 veces en 24 horas a los [address] de vida. Las heces deben ser grumosas y Careers adviser.  No registra una prdida de peso mayor al 10% del peso al nacer durante los primeros [place].  Aumenta de peso un  promedio de 4 a 7onzas (113 a 198g) por semana despus de los [place].  Aumenta de [place], [place], de [place] uniforme a Proofreader de los 5 das de vida, sin Museum/gallery curator prdida de peso despus de las 2semanas de vida. Despus de alimentarse, es posible que el beb regurgite una pequea cantidad de [place]. Esto es normal. Frecuencia y duracin de la lactancia El amamantamiento frecuente la ayudar a producir ms [place] y puede prevenir dolores en los pezones y las mamas extremadamente llenas (congestin [place]). Alimente al beb cuando muestre signos de hambre o si siente la necesidad de reducir la congestin de las [place]. Esto se denomina "lactancia a demanda". Las seales de que el beb tiene hambre incluyen las siguientes:  Aumento del [place] de [place], [place] o inquietud.  Mueve la cabeza de un lado a otro.  Abre la boca cuando se le toca la mejilla o la comisura de la boca (reflejo de bsqueda).  [place], tales como  sonidos de succin, se relame los labios, emite arrullos, suspiros o chirridos.  Mueve la [employer] boca y se chupa los dedos o las manos.  Est molesto o llora.  Evite el uso del chupete en las primeras 4 a 6 semanas despus del nacimiento del beb. Despus de este perodo, podr usar un chupete. Las investigaciones demostraron que el uso del chupete durante Software engineer ao de vida del beb disminuye el riesgo de tener el sndrome de muerte sbita del lactante (SMSL). Permita que el nio se alimente en cada mama todo lo que desee. Cuando el beb se desprende o se queda dormido mientras se est alimentando de la primera mama, ofrzcale la segunda. Debido a que, con frecuencia, los recin nacidos estn somnolientos las primeras semanas de vida, es posible que deba despertar al beb para alimentarlo. Los horarios de Writer de un beb a otro. Sin embargo, las siguientes reglas pueden servir como gua para ayudarla a Engineer, materials que el beb se alimenta adecuadamente:  Se puede amamantar a los recin nacidos (bebs de 4 semanas o menos de vida) cada 1 a 3 horas.  No deben transcurrir ms de 3 horas durante el da o 5 horas durante la noche sin que se amamante a los recin nacidos.  Debe amamantar al beb un mnimo de 8 veces en un perodo de 24 horas.  Extraccin de [employer] extraccin y Recruitment consultant de la leche materna le permiten asegurarse de que el beb se alimente exclusivamente de su leche materna, aun en momentos en los que no puede Economist. Esto tiene especial importancia si debe regresar al [place] en el perodo en que an est amamantando o si no puede estar presente en los momentos en que el beb debe alimentarse. Su asesor en lactancia puede ayudarla a Pension scheme manager un mtodo de extraccin que funcione mejor para usted y Aeronautical engineer cunto tiempo es [place]. Cmo cuidar las mamas durante la lactancia Los pezones pueden secarse, Medical illustrator y  doler durante la Transport planner. Las siguientes recomendaciones pueden ayudarla a Theatre manager las [employer] y sanas:  Art therapist usar jabn en los pezones.  Use un sostn de soporte diseado especialmente para la lactancia materna. Evite usar sostenes con aro o sostenes muy ajustados (sostenes deportivos).  Seque al aire sus pezones durante 3 a 9minutos despus de amamantar al beb.  Utilice solo apsitos de Chiropodist sostn para Tax adviser las prdidas de [place]. La prdida de un [place]  entre las tomas es normal.  Utilice lanolina sobre los pezones luego de Economist. La lanolina ayuda a mantener la humedad normal de la piel. La lanolina pura no es perjudicial (no es txica) para el beb. Adems, puede extraer Cisco algunas gotas de [place] materna y Community education officer suavemente esa [employer]s pezones para que la [place] se seque al aire.  Durante las primeras semanas despus del nacimiento, algunas mujeres experimentan [place]. La congestin The Pepsi puede hacer que sienta las mamas pesadas, calientes y sensibles al tacto. El pico de la congestin mamaria ocurre en el plazo de los 3 a 5 das despus del [place]. Las siguientes recomendaciones pueden ayudarla a Public house manager la congestin mamaria:  Vace por completo las mamas al [place]. Puede aplicar calor hmedo en las mamas (en la ducha o con toallas hmedas para manos) antes de Economist o extraer [employer] Utilities. Esto aumenta la circulacin y [place] a que la [place]. Si el beb no vaca por completo las mamas cuando lo amamanta, extraiga la [place] restante despus de que haya finalizado.  Aplique compresas de hielo [employer] inmediatamente despus de Economist o extraer [place], a menos que le resulte demasiado incmodo. Haga lo siguiente: ? Ponga el hielo en una bolsa plstica. ? Coloque una [employer] piel y la bolsa de hielo. ? Coloque el hielo durante 66minutos, 2 o 3veces por da.  Asegrese de que el  beb est prendido y se encuentre en la posicin correcta mientras lo alimenta.  Si la congestin mamaria persiste luego de 48 horas o despus de seguir estas recomendaciones, comunquese con su mdico o un Lobbyist. Recomendaciones de salud general durante la lactancia  Consuma 3 comidas y 3 colaciones [place]. Las Toll Brothers bien alimentadas que amamantan necesitan entre 450 y [place] por Training and development officer. Puede cumplir con este requisito al aumentar la cantidad de una dieta equilibrada que realice.  Beba suficiente agua para mantener la orina clara o de color amarillo plido.  Descanse con frecuencia, reljese y siga tomando sus vitaminas prenatales para prevenir la fatiga, el estrs y los niveles bajos de vitaminas y [employer] Scientific en el cuerpo (deficiencias de nutrientes).  No consuma ningn producto que contenga nicotina o tabaco, como cigarrillos y Psychologist, sport and exercise. El beb puede verse afectado por las sustancias qumicas de los cigarrillos que pasan a la [place] materna y por la exposicin al humo ambiental del tabaco. Si necesita ayuda para dejar de fumar, consulte al mdico.  Evite el consumo de alcohol.  No consuma drogas ilegales o marihuana.  Antes de Engineer, manufacturing systems, hable con el mdico. Estos incluyen medicamentos recetados y de [place], como tambin vitaminas y suplementos a base de hierbas. Algunos medicamentos, que pueden ser perjudiciales para el beb, pueden pasar a travs de la [employer] Corporation.  Puede quedar embarazada durante la lactancia. Si se desea un mtodo anticonceptivo, consulte al mdico sobre cules son [place]. Dnde encontrar ms informacin: Liga internacional [place]: [email or web address]. Comunquese con un mdico si:  Siente que quiere dejar de Economist o se siente frustrada con la lactancia.  Sus pezones estn agrietados o Control and instrumentation engineer.  Sus mamas estn irritadas, sensibles o  calientes.  Tiene los siguientes sntomas: ? Dolor en las mamas o en los pezones. ? Un rea hinchada en cualquiera de las mamas. ? Cristy Hilts o escalofros. ? Nuseas o vmitos. ? Drenaje de otro lquido distinto de la Northeast Utilities materna desde los pezones.  Sus mamas no se llenan antes de Economist al beb para el quinto da despus del [place].  Se siente triste y deprimida.  El beb: ? Est demasiado somnoliento como para comer bien. ? Tiene problemas para dormir. ? Tiene ms de 1 semana de vida y Albertson's de 6 paales en un periodo de 24 horas. ? No ha aumentado de peso a los [place].  El beb defeca menos de 3 veces en 24 horas.  La piel del beb o las partes blancas de los ojos se vuelven amarillentas. Solicite ayuda de inmediato si:  El beb est muy cansado Engineer, manufacturing) y no se quiere despertar para comer.  Le sube la fiebre sin causa. Resumen  La lactancia materna ofrece muchos beneficios de salud para bebs y [place].  Intente amamantar a su beb cuando muestre signos tempranos de hambre.  Haga cosquillas o empuje suavemente los labios del beb con el dedo o el pezn para lograr que el beb abra la boca. Acerque el beb a la mama. Asegrese de que la mayor parte de la arola se encuentre dentro de la boca del beb. Ofrzcale una mama y haga eructar al beb antes de pasar a la otra.  Hable con su mdico o asesor en lactancia si tiene dudas o problemas con la lactancia. Esta informacin no tiene Marine scientist el consejo del mdico. Asegrese de hacerle al mdico cualquier pregunta que tenga. Document Released: 01/04/2005 Document Revised: 04/26/2016 Document Reviewed: 04/26/2016 Elsevier Interactive Patient Education  2018 [employer].

## 2017-12-22 NOTE — H&P (Signed)
FACULTY PRACTICE ANTEPARTUM ADMISSION HISTORY AND PHYSICAL NOTE   History of Present Illness: Barbara Donovan is a 27 y.o. G1P0 at 70w1dadmitted for uncontrolled diabetes; IUGR with elevated dopplers and chronic HTN.  Pt reported increased dyspnea earlier. She reports that it has resolved currently.    Patient reports the fetal movement as active. Patient reports uterine contraction  activity as none. Patient reports  vaginal bleeding as none. Patient describes fluid per vagina as None. Fetal presentation is cephalic.  Patient Active Problem List   Diagnosis Date Noted  . Diabetes mellitus complicating pregnancy, antepartum 12/22/2017  . Intrauterine growth restriction (IUGR) affecting care of mother 12/22/2017  . 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 Pregnancy complicated by multiple fetal congenital anomalies 10/30/2017  . Severe hyperemesis gravidarum 10/30/2017  . Ventricular septal defect (VSD) of fetus in singleton pregnancy, antepartum 09/30/2017  . Chronic hypertension 08/21/2017  . Depression during pregnancy, antepartum 07/07/2017  . Supervision of high risk pregnancy, antepartum 07/07/2017  . Type 1 diabetes mellitus affecting pregnancy, antepartum 07/07/2017  . Chronic hypertension affecting pregnancy 07/07/2017  . Diabetic retinopathy ([place] 06/09/2017  . Acute depression 11/02/2014  . Asthma 10/30/2013    Past Medical History:  Diagnosis Date  . Diabetes ([place]    TYPE I  . Hypertension complicating pregnancy     Past Surgical History:  Procedure Laterality Date  . NO PAST SURGERIES    . WISDOM TOOTH EXTRACTION      OB History  Gravida Para Term Preterm AB Living  1         0  SAB TAB Ectopic Multiple Live Births               # Outcome Date GA Lbr Len/2nd Weight Sex Delivery Anes PTL Lv  1 Current             Social History   Socioeconomic History  . Marital status: Single    Spouse name: Not on file  . Number of  children: Not on file  . Years of education: Not on file  . Highest education level: Not on file  Occupational History  . Not on file  Social Needs  . Financial resource strain: Not on file  . Food insecurity:    Worry: Not on file    Inability: Not on file  . Transportation needs:    Medical: Not on file    Non-medical: Not on file  Tobacco Use  . Smoking status: Never Smoker  . Smokeless tobacco: Never Used  Substance and Sexual Activity  . Alcohol use: Not Currently  . Drug use: Never  . Sexual activity: Not Currently    Birth control/protection: None  Lifestyle  . Physical activity:    Days per week: Not on file    Minutes per session: Not on file  . Stress: Not on file  Relationships  . Social connections:    Talks on phone: Not on file    Gets together: Not on file    Attends religious service: Not on file    Active member of club or organization: Not on file    Attends meetings of clubs or organizations: Not on file    Relationship status: Not on file  Other Topics Concern  . Not on file  Social History Narrative  . Not on file    Family History  Problem Relation Age of Onset  . Diabetes Mother   . Diabetes Father   .  Cataracts Father   . Amblyopia Neg Hx   . Blindness Neg Hx   . Glaucoma Neg Hx   . Macular degeneration Neg Hx   . Retinal detachment Neg Hx   . Strabismus Neg Hx   . Retinitis pigmentosa Neg Hx     No Known Allergies  Medications Prior to Admission  Medication Sig Dispense Refill Last Dose  . aspirin 81 MG chewable tablet Chew 81 mg by mouth daily.    12/22/2017 at Unknown time  . famotidine (PEPCID) 20 MG tablet Take 20 mg by mouth daily as needed for heartburn.    12/21/2017 at Unknown time  . insulin aspart (NOVOLOG FLEXPEN) 100 UNIT/ML FlexPen Inject 7 Units into the skin 3 (three) times daily with meals. 30 pen 3 12/22/2017 at Unknown time  . Insulin Glargine (BASAGLAR KWIKPEN) 100 UNIT/ML SOPN Inject 0.25 mLs (25 Units total) into  the skin at bedtime. 5 pen 0 12/22/2017 at Unknown time  . labetalol (NORMODYNE) 300 MG tablet Take 2 tablets (600 mg total) by mouth 3 (three) times daily. 180 tablet 3 12/22/2017 at Unknown time  . NIFEdipine (PROCARDIA XL/NIFEDICAL XL) 60 MG 24 hr tablet Take 60 mg by mouth 2 (two) times daily.   12/22/2017 at Unknown time  . Pyridoxine HCl (B-6 PO) Take 1 tablet by mouth daily.    12/21/2017 at Unknown time  . Blood Pressure Monitoring (BLOOD PRESSURE CUFF) MISC 1 kit by Does not apply route daily. (Patient not taking: Reported on 12/22/2017) 1 each 0 Not Taking  . furosemide (LASIX) 40 MG tablet Take 1 tablet (40 mg total) by mouth 2 (two) times daily for 3 days. (Patient not taking: Reported on 12/22/2017) 6 tablet 0 Unknown at Unknown time    Review of Systems -see problem list and HPI  Vitals:  BP (!) 135/102 (BP Location: Right Arm)   Pulse 97   Temp 98.6 F (37 C) (Oral)   Resp 17   Ht 5' 6"  (1.676 m)   Wt 98 kg   LMP 04/02/2017 (Approximate)   SpO2 100%   BMI 34.86 kg/m  Physical Examination: GENERAL: Well-developed, well-nourished female in no acute distress. Pt has increased work of breathing  LUNGS: Clear to auscultation bilaterally. No rales or rhonchi  HEART: Regular rate and rhythm. ABDOMEN: Soft, nontender, nondistended. No organomegaly. EXTREMITIES: No cyanosis or clubbing. 3+ pitting edema to knee bilaterally with weeping of skin and skin breakdown noted. CERVIX: Not evaluated.  fetal presentation is cephalic. Membranes:intact Fetal Monitoring:Baseline: 130's bpm, Variability: Good {> 6 bpm), Accelerations: Reactive and Decelerations: Absent Tocometer: Flat  Labs:  Results for orders placed or performed during the hospital encounter of 12/22/17 (from the past 24 hour(s))  Comprehensive metabolic panel   Collection Time: 12/22/17  7:50 PM  Result Value Ref Range   Sodium 135 135 - 145 mmol/L   Potassium 3.8 3.5 - 5.1 mmol/L   Chloride 103 98 - 111 mmol/L   CO2 24  22 - 32 mmol/L   Glucose, Bld 89 70 - 99 mg/dL   BUN 10 6 - 20 mg/dL   Creatinine, Ser 0.56 0.44 - 1.00 mg/dL   Calcium 8.4 (L) 8.9 - 10.3 mg/dL   Total Protein 5.5 (L) 6.5 - 8.1 g/dL   Albumin 1.6 (L) 3.5 - 5.0 g/dL   AST 24 15 - 41 U/L   ALT 21 0 - 44 U/L   Alkaline Phosphatase 64 38 - 126 U/L   Total Bilirubin 0.4  0.3 - 1.2 mg/dL   GFR calc non Af Amer >60 >60 mL/min   GFR calc Af Amer >60 >60 mL/min   Anion gap 8 5 - 15  CBC on admission   Collection Time: 12/22/17  7:50 PM  Result Value Ref Range   WBC 7.6 4.0 - 10.5 K/uL   RBC 3.88 3.87 - 5.11 MIL/uL   Hemoglobin 11.2 (L) 12.0 - 15.0 g/dL   HCT 32.6 (L) 36.0 - 46.0 %   MCV 84.0 80.0 - 100.0 fL   MCH 28.9 26.0 - 34.0 pg   MCHC 34.4 30.0 - 36.0 g/dL   RDW 13.2 11.5 - 15.5 %   Platelets 451 (H) 150 - 400 K/uL   nRBC 0.0 0.0 - 0.2 %  Type and screen [place]   Collection Time: 12/22/17  7:50 PM  Result Value Ref Range   ABO/RH(D) A POS    Antibody Screen NEG    Sample Expiration      12/25/2017 Performed at [facility], [address] [place], [place] [address]   Urinalysis, Routine w reflex microscopic   Collection Time: 12/22/17  8:25 PM  Result Value Ref Range   Color, Urine YELLOW YELLOW   APPearance HAZY (A) CLEAR   Specific Gravity, Urine 1.012 1.005 - 1.030   pH 7.0 5.0 - 8.0   Glucose, UA NEGATIVE NEGATIVE mg/dL   Hgb urine dipstick NEGATIVE NEGATIVE   Bilirubin Urine NEGATIVE NEGATIVE   Ketones, ur NEGATIVE NEGATIVE mg/dL   Protein, ur >=300 (A) NEGATIVE mg/dL   Nitrite NEGATIVE NEGATIVE   Leukocytes, UA NEGATIVE NEGATIVE   RBC / HPF 0-5 0 - 5 RBC/hpf   WBC, UA 0-5 0 - 5 WBC/hpf   Bacteria, UA RARE (A) NONE SEEN   Squamous Epithelial / LPF 0-5 0 - 5   Mucus PRESENT   Glucose, capillary   Collection Time: 12/22/17  8:47 PM  Result Value Ref Range   Glucose-Capillary 82 70 - 99 mg/dL  Results for orders placed or performed in visit on 12/22/17 (from the past 24 hour(s))   POCT urinalysis dip (device)   Collection Time: 12/22/17  1:29 PM  Result Value Ref Range   Glucose, UA NEGATIVE NEGATIVE mg/dL   Bilirubin Urine NEGATIVE NEGATIVE   Ketones, ur NEGATIVE NEGATIVE mg/dL   Specific Gravity, Urine 1.025 1.005 - 1.030   Hgb urine dipstick MODERATE (A) NEGATIVE   pH 7.0 5.0 - 8.0   Protein, ur >=300 (A) NEGATIVE mg/dL   Urobilinogen, UA 1.0 0.0 - 1.0 mg/dL   Nitrite NEGATIVE NEGATIVE   Leukocytes, UA NEGATIVE NEGATIVE    Imaging Studies: [name] Mfm Fetal Bpp Wo Non Stress  Result Date: 12/22/2017 ----------------------------------------------------------------------  OBSTETRICS REPORT                       (Signed Final 12/22/2017 06:30 pm) ---------------------------------------------------------------------- Patient Info  ID #:       [id number]                          D.O.B.:  Mar 31, 1990 (27 yrs)  Name:       Barbara Donovan                  Visit Date: 12/22/2017 03:53 pm ---------------------------------------------------------------------- Performed By  Performed By:     Hubert Azure          Secondary Phy.:   [facility]  [place] [facility]                                                             [facility]                                                             Healthcare  Attending:        Lajoyce Lauber      Address:          [facility]                    MD                                                             [facility]                                                             [place]                                                             [place], [place]  Referred By:      [facility]       Location:         [facility]  Center for                    [employer]  Ref.  Address:     [facility]                    [place]                    [place], [place] ---------------------------------------------------------------------- Orders   #  Description                          Code         Ordered By   1  [name] MFM OB FOLLOW UP                  16109.60     Sander Nephew   2  [name] MFM FETAL BPP WO NON              76819.01     JACOB STINSON      STRESS  ----------------------------------------------------------------------   #  Order #                    Accession #                 Episode #   1  [id number]                  [id number]   2  [id number]                  [id number]                  [id number]  ---------------------------------------------------------------------- Indications   Pre-existing essential hypertension            V42.595   complicating pregnancy, third trimester   [redacted] weeks gestation of pregnancy                Z3A.35   Pre-existing diabetes, type 1, in pregnancy,   O24.013   third trimester  ---------------------------------------------------------------------- Fetal Evaluation  Num Of Fetuses:         1  Fetal Heart Rate(bpm):  129  Cardiac Activity:       Observed  Presentation:           Cephalic  Amniotic Fluid  AFI FV:      Subjectively decreased  AFI Sum(cm)     %Tile       Largest Pocket(cm)  5.62            <  3         3.42  RUQ(cm)       RLQ(cm)  3.42          2.2 ---------------------------------------------------------------------- Biophysical Evaluation  Amniotic F.V:   Within normal limits       F. Breathing:   Observed  F. Movement:    Observed                   Score:          6/6 ---------------------------------------------------------------------- Biometry  BPD:        81  mm     G. Age:  32w 4d          3  %    CI:        74.57   %    70 - 86                                                           FL/HC:      20.6   %    20.1 - 22.3  HC:      297.7  mm     G. Age:  33w 0d        < 3  %    HC/AC:      1.06        0.93 - 1.11  AC:      281.3  mm     G. Age:  32w 1d        < 3  %    FL/BPD:     75.8   %    71 - 87  FL:       61.4  mm     G. Age:  31w 6d        < 3  %    FL/AC:      21.8   %    20 - 24  Est. FW:    1921  gm      4 lb 4 oz     10  % ---------------------------------------------------------------------- OB History  Gravidity:    1         Term:   0        Prem:   0        SAB:   0  TOP:          0       Ectopic:  0        Living: 0 ---------------------------------------------------------------------- Gestational Age  U/S Today:     32w 3d                                        EDD:   02/13/18  Best:          35w 1d     Det. ByLoman Chroman         EDD:   01/25/18                                      (05/30/17) ---------------------------------------------------------------------- Anatomy  Cranium:  Previously seen        Aortic Arch:            Not well visualized  Cavum:                 Previously seen        Ductal Arch:            Not well visualized  Ventricles:            Ventriculomegaly       Diaphragm:              Previously seen  Choroid Plexus:        Dangling Choroid       Stomach:                Previously Seen  Cerebellum:            Previously seen        Abdomen:                Previously seen  Posterior Fossa:       Previously seen        Abdominal Wall:         Not well visualized  Nuchal Fold:           Not applicable (>73    Cord Vessels:           Appears normal ([redacted]                         wks GA)                                        vessel cord)  Face:                  Orbits and profile     Kidneys:                Abnormal, see                         previously seen                                                                        comments  Lips:                  Previously seen        Bladder:                Appears normal   Thoracic:              Appears normal         Spine:                  Previously seen  Heart:                 Not well visualized    Upper Extremities:      Abnormal, see  comments  RVOT:                  Not well visualized    Lower Extremities:      Previously seen  LVOT:                  Not well visualized  Other:  Female gender. Technically difficult due to advanced gestational age. ---------------------------------------------------------------------- Doppler - Fetal Vessels  Umbilical Artery   S/D     %tile     RI              PI                     ADFV    RDFV  6.78    > 97.5  0.85             2.04                        No      No ---------------------------------------------------------------------- Comments  U/S images reviewed. Findings reviewed with patient.  Borderline fetal growth is noted (EFW - 10%).  UA dopplers  are elevated.  No evidence of fetal compromise is found on  BPP today   Multiple fetal abnormalities are seen.  Maternal  ascites is seen during today's U/S  Patient reports increasing shortness of breath and swelling.  Questions answered.  10 minutes spent face to face with patient.  Recommendations: 1) Admission 2) Cardiology Consultation  3) NICU consultation 3) Twice weekly BPP/NST/UA dopplers  4) Delivery between 36-37 weeks ---------------------------------------------------------------------- Recommendations  1) Admission 2) Cardiology Consultation 3) NICU  consultation 3) Twice weekly BPP/NST/UA dopplers 4)  Delivery between 36-37 weeks ----------------------------------------------------------------------               Lajoyce Lauber, MD Electronically Signed Final Report   12/22/2017 06:30 pm ----------------------------------------------------------------------  [name] Mfm Ob Follow Up  Result Date: 12/22/2017 ----------------------------------------------------------------------  OBSTETRICS REPORT                        (Signed Final 12/22/2017 06:30 pm) ---------------------------------------------------------------------- Patient Info  ID #:       [id number]                          D.O.B.:  06/27/1990 (27 yrs)  Name:       Barbara Donovan                  Visit Date: 12/22/2017 03:53 pm ---------------------------------------------------------------------- Performed By  Performed By:     Hubert Azure          Secondary Phy.:   [facility]                                                             Healthcare  Attending:        Lajoyce Lauber      Address:          [facility]                    MD                                                             [facility]                                                             [place]                                                             [place], [place]  Referred By:      [facility]       Location:         [facility] for                    [place]  Ref. Address:     [facility]                    [place],  [place]                    [address] ---------------------------------------------------------------------- Orders   #  Description                          Code         Ordered By   1  [name] MFM OB FOLLOW UP                  76816.01     Sander Nephew   2  [name] MFM FETAL BPP WO NON              76819.01     JACOB STINSON      STRESS  ----------------------------------------------------------------------   #  Order #                    Accession  #                 Episode #   1  [id number]                  [id number]                  [id number]   2  [id number]                  [id number]                  [id number]  ---------------------------------------------------------------------- Indications   Pre-existing essential hypertension            G90.211   complicating pregnancy, third trimester   [redacted] weeks gestation of pregnancy                Z3A.35   Pre-existing diabetes, type 1, in pregnancy,   O24.013   third trimester  ---------------------------------------------------------------------- Fetal Evaluation  Num Of Fetuses:         1  Fetal Heart Rate(bpm):  129  Cardiac Activity:       Observed  Presentation:           Cephalic  Amniotic Fluid  AFI FV:      Subjectively decreased  AFI Sum(cm)     %Tile       Largest Pocket(cm)  5.62            < 3         3.42  RUQ(cm)       RLQ(cm)  3.42          2.2 ---------------------------------------------------------------------- Biophysical Evaluation  Amniotic F.V:   Within normal limits       F. Breathing:   Observed  F. Movement:    Observed                   Score:          6/6 ---------------------------------------------------------------------- Biometry  BPD:        81  mm     G. Age:  32w 4d          3  %    CI:  74.57   %    70 - 86                                                          FL/HC:      20.6   %    20.1 - 22.3  HC:      297.7  mm     G. Age:  33w 0d        < 3  %    HC/AC:      1.06        0.93 - 1.11  AC:      281.3  mm     G. Age:  32w 1d        < 3  %    FL/BPD:     75.8   %    71 - 87  FL:       61.4  mm     G. Age:  31w 6d        < 3  %    FL/AC:      21.8   %    20 - 24  Est. FW:    1921  gm      4 lb 4 oz     10  % ---------------------------------------------------------------------- OB History  Gravidity:    1         Term:   0        Prem:   0        SAB:   0  TOP:          0       Ectopic:  0        Living: 0  ---------------------------------------------------------------------- Gestational Age  U/S Today:     32w 3d                                        EDD:   02/13/18  Best:          35w 1d     Det. By:  Loman Chroman         EDD:   01/25/18                                      (05/30/17) ---------------------------------------------------------------------- Anatomy  Cranium:               Previously seen        Aortic Arch:            Not well visualized  Cavum:                 Previously seen        Ductal Arch:            Not well visualized  Ventricles:            Ventriculomegaly       Diaphragm:              Previously seen  Choroid Plexus:        Dangling Choroid       Stomach:  Previously Seen  Cerebellum:            Previously seen        Abdomen:                Previously seen  Posterior Fossa:       Previously seen        Abdominal Wall:         Not well visualized  Nuchal Fold:           Not applicable (>51    Cord Vessels:           Appears normal ([redacted]                         wks GA)                                        vessel cord)  Face:                  Orbits and profile     Kidneys:                Abnormal, see                         previously seen                                                                        comments  Lips:                  Previously seen        Bladder:                Appears normal  Thoracic:              Appears normal         Spine:                  Previously seen  Heart:                 Not well visualized    Upper Extremities:      Abnormal, see                                                                        comments  RVOT:                  Not well visualized    Lower Extremities:      Previously seen  LVOT:                  Not well visualized  Other:  Female gender. Technically difficult due to advanced gestational age. ---------------------------------------------------------------------- Doppler - Fetal Vessels  Umbilical Artery   S/D      %tile  RI              PI                     ADFV    RDFV  6.78    > 97.5  0.85             2.04                        No      No ---------------------------------------------------------------------- Comments  U/S images reviewed. Findings reviewed with patient.  Borderline fetal growth is noted (EFW - 10%).  UA dopplers  are elevated.  No evidence of fetal compromise is found on  BPP today   Multiple fetal abnormalities are seen.  Maternal  ascites is seen during today's U/S  Patient reports increasing shortness of breath and swelling.  Questions answered.  10 minutes spent face to face with patient.  Recommendations: 1) Admission 2) Cardiology Consultation  3) NICU consultation 3) Twice weekly BPP/NST/UA dopplers  4) Delivery between 36-37 weeks ---------------------------------------------------------------------- Recommendations  1) Admission 2) Cardiology Consultation 3) NICU  consultation 3) Twice weekly BPP/NST/UA dopplers 4)  Delivery between 36-37 weeks ----------------------------------------------------------------------               Lajoyce Lauber, MD Electronically Signed Final Report   12/22/2017 06:30 pm ----------------------------------------------------------------------  US Fetal Bpp W/nonstress  Result Date: 12/12/2017 ----------------------------------------------------------------------  OBSTETRICS REPORT                       (Signed Final 12/12/2017 04:45 pm) ---------------------------------------------------------------------- Patient Info  ID #:       [id number]                          D.O.B.:  12/13/1990 (27 yrs)  Name:       Barbara Donovan                  Visit Date: 12/12/2017 11:50 am ---------------------------------------------------------------------- Performed By  Performed By:     Derinda Late RN      Secondary Phy.:   [place] [facility]                                                              [place]  Attending:        Arlina Robes MD  Address:          [facility]                                                             [place]                                                             [place], [place]  Referred By:      [facility]       Location:         Center for                    Center for                               [place]  Ref. Address:     [facility]                    [place], [place] ---------------------------------------------------------------------- Orders   #  Description  Code         Ordered By   1  US FETAL BPP W/NONSTRESS             76195.0      Arlina Robes  ----------------------------------------------------------------------   #  Order #                    Accession #                 Episode #   1  [id number]                  [id number]                  [id number]  ---------------------------------------------------------------------- Indications   [redacted] weeks gestation of pregnancy                Z3A.33   Pre-existing essential hypertension            L93.790   complicating pregnancy, third trimester   Pre-existing diabetes, type 1, in pregnancy,   O24.013   third trimester  ---------------------------------------------------------------------- Fetal Evaluation  Num Of Fetuses:         1  Preg. Location:         Intrauterine  Cardiac Activity:       Observed  Fetal Lie:              Maternal right side   Presentation:           Cephalic  Amniotic Fluid  AFI FV:      Within normal limits  AFI Sum(cm)     %Tile       Largest Pocket(cm)  15.78           57          5.97  RUQ(cm)       RLQ(cm)       LUQ(cm)        LLQ(cm)  3.97          2.69          5.97           3.15  Comment:    Breathing noted intermittently, but not sustained. ---------------------------------------------------------------------- Biophysical Evaluation  Amniotic F.V:   Pocket => 2 cm two         F. Tone:        Observed                  planes  F. Movement:    Observed                   N.S.T:          Reactive  F. Breathing:   Not Observed               Score:          8/10 ---------------------------------------------------------------------- OB History  Gravidity:    1         Term:   0        Prem:   0        SAB:   0  TOP:          0       Ectopic:  0        Living: 0 ---------------------------------------------------------------------- Gestational Age  Best:          33w 5d     Det. By:  Loman Chroman  EDD:   01/25/18                                      (05/30/17) ---------------------------------------------------------------------- Impression  BPP 8/10 (-2  for breathing)  Vertex ---------------------------------------------------------------------- Recommendations  Continue with antenatal testing as indicated ----------------------------------------------------------------------                  Arlina Robes, MD Electronically Signed Final Report   12/12/2017 04:45 pm ----------------------------------------------------------------------  US Fetal Bpp W/nonstress  Result Date: 12/12/2017 ----------------------------------------------------------------------  OBSTETRICS REPORT                       (Signed Final 12/12/2017 09:28 am) ---------------------------------------------------------------------- Patient Info  ID #:       [id number]                          D.O.B.:  [birth year] (27 yrs)  Name:       Barbara Donovan                   Visit Date: 12/05/2017 03:14 pm ---------------------------------------------------------------------- Performed By  Performed By:     Shauna Hugh Day RNC          Secondary Phy.:   [facility] for                                                             [place]  Attending:        Loma Boston DO       Address:          [facility]                                                             [address]  [place], [place]  Referred By:      [facility]       Location:         Center for                    Center for                               [place]  Ref. Address:     [facility]                    [place], [place] ---------------------------------------------------------------------- Orders   #  Description                          Code         Ordered By   1  US FETAL BPP W/NONSTRESS             49449.6      Loma Boston  ----------------------------------------------------------------------   #  Order #                    Accession #                 Episode #   1  [id number]                  [id number]                  [id number]  ---------------------------------------------------------------------- Service(s) Provided   US Fetal BPP W NST                                   [id number]  ---------------------------------------------------------------------- Indications  [redacted]  weeks gestation of pregnancy                Z3A.32   Unspecified pre-existing hypertension          Z61.096   complicating pregnancy, third trimester   Pre-existing diabetes, type 1, in pregnancy,   O24.013   third trimester  ---------------------------------------------------------------------- Fetal Evaluation  Num Of Fetuses:         1  Preg. Location:         Intrauterine  Cardiac Activity:       Observed  Presentation:           Cephalic  Amniotic Fluid  AFI FV:      Within normal limits  AFI Sum(cm)     %Tile       Largest Pocket(cm)  13.77           46          4.38  RUQ(cm)       RLQ(cm)       LUQ(cm)        LLQ(cm)  4.38          3.23          3.11           3.05 ---------------------------------------------------------------------- Biophysical Evaluation  Amniotic F.V:   Pocket => 2 cm two         F. Tone:        Observed                  planes  F. Movement:    Observed                   N.S.T:          Reactive  F. Breathing:   Observed                   Score:          10/10 ---------------------------------------------------------------------- OB History  Gravidity:    1         Term:   0        Prem:   0        SAB:   0  TOP:          0       Ectopic:  0        Living: 0 ---------------------------------------------------------------------- Gestational Age  Best:          32w 5d     Det. ByLoman Chroman         EDD:   01/25/18                                      (05/30/17) ---------------------------------------------------------------------- Impression  Normal amniotic fluid volume. Antenatal testing is reassuring.  BPP 10/10. ---------------------------------------------------------------------- Recommendations  Continue weekly antenatal testing till delivery. ----------------------------------------------------------------------               Verita Schneiders, MD Electronically Signed Final Report   12/12/2017 09:28 am  ----------------------------------------------------------------------    Prenatal Transfer Tool  Maternal Diabetes: Yes:  Diabetes Type:  Pre-pregnancy, Insulin/Medication controlled Genetic Screening: Normal Maternal Ultrasounds/Referrals: Abnormal:  Findings:   IUGR, Fetal Kidney Anomalies, Other: neuro anomalies Fetal Ultrasounds or other Referrals:  Fetal echo: WNL Maternal Substance Abuse:  No Significant Maternal Medications:  Meds include: Other: see list below Significant Maternal  Lab Results: proteinuria   Assessment and Plan: Patient Active Problem List   Diagnosis Date Noted  . Diabetes mellitus complicating pregnancy, antepartum 12/22/2017  . Intrauterine growth restriction (IUGR) affecting care of mother 12/22/2017  . 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 Pregnancy complicated by multiple fetal congenital anomalies 10/30/2017  . Severe hyperemesis gravidarum 10/30/2017  . Ventricular septal defect (VSD) of fetus in singleton pregnancy, antepartum 09/30/2017  . Chronic hypertension 08/21/2017  . Depression during pregnancy, antepartum 07/07/2017  . Supervision of high risk pregnancy, antepartum 07/07/2017  . Type 1 diabetes mellitus affecting pregnancy, antepartum 07/07/2017  . Chronic hypertension affecting pregnancy 07/07/2017  . Diabetic retinopathy ([place]) 06/09/2017  . Acute depression 11/02/2014  . Asthma 10/30/2013   Admit to Antenatal Lasix 40m IV bid x 4 doses CMP and CBC  Pr:cr  24 hour protein  Daily BMP to watch K+ ECHO in am Consider Cardiology consult in am based on ECHO results F/u labs from ofc today Will recheck presentation if she does progress in preterm labor to determine route of delivery Routine antenatal care  Danyella Mcginty L. Harraway-Smith, M.D., FCherlynn June

## 2017-12-22 NOTE — Progress Notes (Signed)
Pt reports shortness of breath.  SpO2 100%.

## 2017-12-23 ENCOUNTER — Inpatient Hospital Stay (HOSPITAL_COMMUNITY): Payer: BC Managed Care – PPO

## 2017-12-23 DIAGNOSIS — I361 Nonrheumatic tricuspid (valve) insufficiency: Secondary | ICD-10-CM

## 2017-12-23 LAB — CBC
Hematocrit: 28.5 % — ABNORMAL LOW (ref 34.0–46.6)
Hemoglobin: 9.7 g/dL — ABNORMAL LOW (ref 11.1–15.9)
MCH: 27.8 pg (ref 26.6–33.0)
MCHC: 34 g/dL (ref 31.5–35.7)
MCV: 82 fL (ref 79–97)
Platelets: 427 10*3/uL (ref 150–450)
RBC: 3.49 x10E6/uL — ABNORMAL LOW (ref 3.77–5.28)
RDW: 13 % (ref 12.3–15.4)
WBC: 6.3 10*3/uL (ref 3.4–10.8)

## 2017-12-23 LAB — COMPREHENSIVE METABOLIC PANEL
ALT: 16 IU/L (ref 0–32)
AST: 21 IU/L (ref 0–40)
Albumin/Globulin Ratio: 1 — ABNORMAL LOW (ref 1.2–2.2)
Albumin: 2.2 g/dL — ABNORMAL LOW (ref 3.5–5.5)
Alkaline Phosphatase: 58 IU/L (ref 39–117)
BUN/Creatinine Ratio: 13 (ref 9–23)
BUN: 8 mg/dL (ref 6–20)
Bilirubin Total: <0.2 mg/dL (ref 0.0–1.2)
CO2: 23 mmol/L (ref 20–29)
Calcium: 8 mg/dL — ABNORMAL LOW (ref 8.7–10.2)
Chloride: 102 mmol/L (ref 96–106)
Creatinine, Ser: 0.6 mg/dL (ref 0.57–1.00)
GFR calc Af Amer: 144 mL/min/{1.73_m2} (ref >59)
GFR calc non Af Amer: 125 mL/min/{1.73_m2} (ref >59)
GLOBULIN, TOTAL: 2.3 g/dL (ref 1.5–4.5)
Glucose: 95 mg/dL (ref 65–99)
Potassium: 4.2 mmol/L (ref 3.5–5.2)
Sodium: 135 mmol/L (ref 134–144)
Total Protein: 4.5 g/dL — ABNORMAL LOW (ref 6.0–8.5)

## 2017-12-23 LAB — BASIC METABOLIC PANEL
Anion gap: 9 (ref 5–15)
BUN: 9 mg/dL (ref 6–20)
CALCIUM: 8.4 mg/dL — AB (ref 8.9–10.3)
CO2: 24 mmol/L (ref 22–32)
CREATININE: 0.59 mg/dL (ref 0.44–1.00)
Chloride: 102 mmol/L (ref 98–111)
GFR calc Af Amer: >60 mL/min (ref >60)
GFR calc non Af Amer: >60 mL/min (ref >60)
Glucose, Bld: 71 mg/dL (ref 70–99)
Potassium: 3.3 mmol/L — ABNORMAL LOW (ref 3.5–5.1)
Sodium: 135 mmol/L (ref 135–145)

## 2017-12-23 LAB — PROTEIN, URINE, 24 HOUR
Collection Interval-UPROT: 24 hours
Protein, 24H Urine: 8188 mg/d — ABNORMAL HIGH (ref 50–100)
Protein, Urine: 184 mg/dL
Urine Total Volume-UPROT: 4450 mL

## 2017-12-23 LAB — GLUCOSE, CAPILLARY
Glucose-Capillary: 69 mg/dL — ABNORMAL LOW (ref 70–99)
Glucose-Capillary: 83 mg/dL (ref 70–99)
Glucose-Capillary: 79 mg/dL (ref 70–99)
Glucose-Capillary: 74 mg/dL (ref 70–99)
Glucose-Capillary: 119 mg/dL — ABNORMAL HIGH (ref 70–99)

## 2017-12-23 LAB — PROTEIN / CREATININE RATIO, URINE
Creatinine, Urine: 94.6 mg/dL
Protein, Ur: 986.1 mg/dL
Protein/Creat Ratio: 10424 mg/g creat — ABNORMAL HIGH (ref 0–200)

## 2017-12-23 LAB — ECHOCARDIOGRAM COMPLETE
Height: 66 in
Weight: 3456 oz

## 2017-12-23 MED ORDER — POTASSIUM CHLORIDE CRYS ER 20 MEQ PO TBCR
20.0000 meq | EXTENDED_RELEASE_TABLET | Freq: Two times a day (BID) | ORAL | Status: DC
  Administered 2017-12-23 – 2017-12-25 (×5): 20 meq via ORAL
  Filled 2017-12-23 (×6): qty 1

## 2017-12-23 MED ORDER — LACTATED RINGERS IV BOLUS: 500.0000 mL | Freq: Once | INTRAVENOUS | Status: DC

## 2017-12-23 MED ORDER — BACITRACIN-NEOMYCIN-POLYMYXIN OINTMENT TUBE
TOPICAL_OINTMENT | Freq: Two times a day (BID) | CUTANEOUS | Status: DC
  Administered 2017-12-23 – 2017-12-25 (×5): via TOPICAL
  Filled 2017-12-23: qty 14.17

## 2017-12-23 NOTE — Progress Notes (Addendum)
ADA Standards of Care 2019 Diabetes in Pregnancy Target Glucose Ranges:  Fasting: 60 - 90 mg/dL Preprandial: 60 - 105 mg/dL 1 hr postprandial: Less than 140mg /dL (from first bite of meal) 2 hr postprandial: Less than 120 mg/dL (from first bit of meal)  Lab Results  Component Value Date   GLUCAP 69 (L) 12/23/2017   HGBA1C 6.6 (H) 10/29/2017    Review of Glycemic ControlResults for AASIYAH, AUERBACH (MRN [id number]) as of 12/23/2017 10:19  Ref. Range 11/04/2017 06:11 11/04/2017 12:03 12/22/2017 20:47 12/22/2017 22:17 12/23/2017 09:59  Glucose-Capillary Latest Ref Range: 70 - 99 mg/dL 94 67 (L) 82 116 (H) 69 (L)    Diabetes history: Type 1 DM  Outpatient Diabetes medications:  Novolog 7 units tid with meals, Basaglar 25 units q HS Current orders for Inpatient glycemic control:  Novolog 7 units tid with meals, Lantus 25 units q HS Inpatient Diabetes Program Recommendations:   Please add 2 hour post-prandial blood sugar checks and 0-14 units Novolog tid after meals.   Thanks,  Adah Perl, RN, BC-ADM Inpatient Diabetes Coordinator Pager [phone] (8a-5p)

## 2017-12-23 NOTE — Progress Notes (Signed)
PRENATAL VISIT NOTE  Subjective:  Barbara Donovan is a 27 y.o. G1P0 at [redacted]w[redacted]d being seen today for ongoing prenatal care.  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  Patient reports shortness of breath, no fever, chills, cough, bringing up phlegm, chest pain, hemoptysis. Also with significant LE edema.  Contractions: Not present. Vag. Bleeding: None.  Movement: (!) Decreased. Denies leaking of fluid.   The following portions of the patient's history were reviewed and updated as appropriate: allergies, current medications, past family history, past medical history, past social history, past surgical history and problem list. Problem list updated.  Objective:   Vitals:   12/22/17 1322 12/22/17 1323  BP: (!) 148/87 (!) 152/87  Pulse: 91 85    Fetal Status: Fetal Heart Rate (bpm): 123   Movement: (!) Decreased     General:  Alert, oriented and cooperative. Patient is in no acute distress.  Skin: Skin is warm and dry. No rash noted.   Cardiovascular: Normal heart rate noted  Respiratory: Mild tachypnea, cannot complete full sentence without stopping, lungs are clear bilaterally  Abdomen: Soft, gravid, appropriate for gestational age.  Pain/Pressure: Present     Pelvic: Cervical exam deferred        Extremities: Normal range of  motion.  Edema: Deep pitting, indentation remains for a short time legs are weeping  Mental Status: Normal mood and affect. Normal behavior. Normal judgment and thought content.  NST:  Baseline: 120 bpm, Variability: Good {> 6 bpm), Accelerations: Reactive and Decelerations: Absent   Assessment and Plan:  Pregnancy: G1P0 at [redacted]w[redacted]d  1. Ventricular septal defect (VSD) of fetus in singleton pregnancy, antepartum   2. Chronic hypertension affecting pregnancy BP is trending, will check labs--also ensure she is not having some acidemia which is causing her to breath more rapidly Continue Procardia and Labetalol and ASA - CBC - Comprehensive metabolic panel - Protein / creatinine ratio, urine  3. Type 1 diabetes mellitus affecting pregnancy, antepartum Less hypoglycemia since decreasing her pm Lantus--continue Lantus and Novolog  4. 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 Supervision of high risk pregnancy, antepartum   6. Pregnancy affected by multiple congenital anomalies of fetus, single or unspecified fetus F/u u/s today  7. Depression during pregnancy, antepartum Continue IBH prn  8. Gestational edema in third trimester Likely related to nephrotic range proteinuria-- attempt to give short term lasix - furosemide (LASIX) 40 MG tablet; Take 1 tablet (40 mg total) by mouth 2 (two) times daily for 3 days. (Patient not taking: Reported on 12/22/2017)  Dispense: 6 tablet; Refill: 0  9. Shortness of breath - DG Chest 2 View; Future - Ambulatory referral to Cardiology  Preterm labor symptoms and general obstetric precautions including but not limited to vaginal bleeding, contractions, leaking of fluid and fetal movement were reviewed in detail with the patient. Please refer to After Visit Summary for other counseling recommendations.  Return in 1 week  (on 12/29/2017).  Future Appointments  Date Time Provider [place]  12/23/2017  4:00 PM [facility]- ECHO 1-RUTH [facility]  12/30/2017  3:00 PM Bernarda Caffey, MD TRE-TRE None    [name], MD

## 2017-12-23 NOTE — Progress Notes (Signed)
  Echocardiogram 2D Echocardiogram has been performed.  Barbara Donovan 12/23/2017, 9:03 AM

## 2017-12-23 NOTE — Progress Notes (Signed)
Initial Nutrition Assessment  DOCUMENTATION CODES:  Not applicable INTERVENTION:  CHO modified gestational diabetic diet/ double protein  NUTRITION DIAGNOSIS:  Altered nutrition lab value related to (pregnancy/ GDM) as evidenced by (hyperglycemia). GOAL:  Patient will meet greater than or equal to 90% of their needs MONITOR:  Labs REASON FOR ASSESSMENT:  Gestational Diabetes, Antenatal   ASSESSMENT:  35 2/7 weeks, adm for diabetic controll. Hx of DM I, CHTN and n/v in early preg. Wt at 9 week visit 174 lbs, BMI 28.3. 41 lb weight gain to date.  delivery planned for 36-37 weeks Diet Order:   Diet Order            Diet gestational carb mod Room service appropriate? Yes; Fluid consistency: Thin  Diet effective now             EDUCATION NEEDS:   No education needs have been identified at this time(Endocrine follow-up with Dr Loanne Drilling. Proficiency in [place] in 7/31 Nutr visit)  Skin:  Skin Assessment: Reviewed RN Assessment  Height:   Ht Readings from Last 1 Encounters:  12/22/17 5\' 6"  (1.676 m)   Weight:   Wt Readings from Last 1 Encounters:  12/22/17 98 kg   Ideal Body Weight:   130 lbs  BMI:  Body mass index is 34.86 kg/m.  Estimated Nutritional Needs:   Kcal:  2000-2200  Protein:  90-100 g  Fluid:  2.3 L    Weyman Rodney M.Fredderick Severance LDN Neonatal Nutrition Support Specialist/RD III Pager [phone]      Phone [phone]

## 2017-12-23 NOTE — Progress Notes (Signed)
Barbara Donovan NOTE  Barbara Donovan is a 27 y.o. G1P0 at [redacted]w[redacted]d who is admitted for uncontrolled T1DM.  Estimated Date of Delivery: 01/25/18 Fetal presentation is cephalic.  Length of Stay:  1 Days. Admitted 12/22/2017  Subjective:  Patient reports normal fetal movement.  She denies uterine contractions, denies bleeding and leaking of fluid per vagina. She is overall feeling well, does not feel as labored breathing as she had perviously.   Vitals:  Blood pressure (!) 145/86, pulse 85, temperature 98.2 F (36.8 C), temperature source Oral, resp. rate 18, height 5\' 6"  (1.676 m), weight 98 kg, last menstrual period 04/02/2017, SpO2 99 %. Physical Examination: CONSTITUTIONAL: Well-developed, well-nourished female in no acute distress.  HENT:  Normocephalic, atraumatic, External right and left ear normal. Oropharynx is clear and moist EYES: Conjunctivae and EOM are normal. Pupils are equal, round, and reactive to light. No scleral icterus.  NECK: Normal range of motion, supple, no masses. SKIN: Skin is warm and dry. No rash noted. Not diaphoretic. No erythema. No pallor. [place]: Alert and oriented to person, place, and time. Normal reflexes, muscle tone coordination. No cranial nerve deficit noted. PSYCHIATRIC: Normal mood and affect. Normal behavior. Normal judgment and thought content. CARDIOVASCULAR: Normal heart rate noted, regular rhythm RESPIRATORY: Effort and breath sounds normal, no problems with respiration noted MUSCULOSKELETAL: Normal range of motion. +3 pitting edema with weeping leg wounds on lower extremities, mild tenderness. ABDOMEN: Soft, nontender, nondistended, gravid. CERVIX: deferred  Fetal monitoring: FHR: 125 bpm, Variability: moderate, Accelerations: Present, Decelerations: Absent  Uterine activity: no contractions per hour    Current scheduled medications . aspirin  81 mg Oral Daily  . docusate sodium  100 mg Oral Daily  . doxylamine (Sleep)   25 mg Oral Daily  . furosemide  40 mg Intravenous BID  . insulin aspart  7 Units Subcutaneous TID WC  . insulin glargine  25 Units Subcutaneous QHS  . labetalol  600 mg Oral Q8H  . neomycin-bacitracin-polymyxin   Topical BID  . NIFEdipine  60 mg Oral BID  . prenatal multivitamin  1 tablet Oral Q1200  . pyridOXINE  50 mg Oral Daily    I have reviewed the patient's current medications.  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   Feliz Beam, M.D. Attending Center for [employer] (Faculty Practice)  12/23/2017 11:51 AM

## 2017-12-24 LAB — BASIC METABOLIC PANEL
Anion gap: 9 (ref 5–15)
BUN: 12 mg/dL (ref 6–20)
CHLORIDE: 102 mmol/L (ref 98–111)
CO2: 23 mmol/L (ref 22–32)
CREATININE: 0.64 mg/dL (ref 0.44–1.00)
Calcium: 8.5 mg/dL — ABNORMAL LOW (ref 8.9–10.3)
GFR calc Af Amer: >60 mL/min (ref >60)
GFR calc non Af Amer: >60 mL/min (ref >60)
Glucose, Bld: 81 mg/dL (ref 70–99)
Potassium: 3.5 mmol/L (ref 3.5–5.1)
Sodium: 134 mmol/L — ABNORMAL LOW (ref 135–145)

## 2017-12-24 LAB — GLUCOSE, CAPILLARY
GLUCOSE-CAPILLARY: 88 mg/dL (ref 70–99)
GLUCOSE-CAPILLARY: 100 mg/dL — AB (ref 70–99)
Glucose-Capillary: 73 mg/dL (ref 70–99)
Glucose-Capillary: 80 mg/dL (ref 70–99)
Glucose-Capillary: 82 mg/dL (ref 70–99)
Glucose-Capillary: 110 mg/dL — ABNORMAL HIGH (ref 70–99)

## 2017-12-24 LAB — CULTURE, BETA STREP (GROUP B ONLY)

## 2017-12-24 NOTE — Progress Notes (Signed)
FACULTY PRACTICE ANTEPARTUM(COMPREHENSIVE) NOTE  Barbara Donovan is a 27 y.o. G1P0 at [redacted]w[redacted]d  who is admitted for uncontrolled diabetes; IUGR with elevated dopplers and chronic HTN.  Pt reported increased dyspnea earlier. She reports that it has resolved currently.  .   Fetal presentation is cephalic. Length of Stay:  2  Days  Subjective: Pt resting comfortably.  Patient reports the fetal movement as active. Patient reports uterine contraction  activity as none. Patient reports  vaginal bleeding as none. Patient describes fluid per vagina as None.  Vitals:  Blood pressure (!) 162/92, pulse 79, temperature 98.4 F (36.9 C), temperature source Oral, resp. rate 18, height 5\' 6"  (1.676 m), weight 93.5 kg, last menstrual period 04/02/2017, SpO2 98 %. Physical Examination:  General appearance - alert, well appearing, and in no distress, oriented to person, place, and time and overweight Heart - normal rate and regular rhythm Abdomen - soft, nontender, nondistended Fundal Height:  size equals dates Cervical Exam: Not evaluated. and found to be not evaluated/ /-3 and fetal presentation is cephalic. Extremities: extremities normal, atraumatic, no cyanosis or edema and Homans sign is negative, no sign of DVT with DTRs 2+ bilaterally Membranes:intact  Fetal Monitoring:  Baseline: 135 bpm, Variability: Good {> 6 bpm), Accelerations: Reactive and Decelerations: Absent  Labs:  Results for orders placed or performed during the hospital encounter of 12/22/17 (from the past 24 hour(s))  Glucose, capillary   Collection Time: 12/23/17  9:59 AM  Result Value Ref Range   Glucose-Capillary 69 (L) 70 - 99 mg/dL  Basic metabolic panel   Collection Time: 12/23/17 10:14 AM  Result Value Ref Range   Sodium 135 135 - 145 mmol/L   Potassium 3.3 (L) 3.5 - 5.1 mmol/L   Chloride 102 98 - 111 mmol/L   CO2 24 22 - 32 mmol/L   Glucose, Bld 71 70 - 99 mg/dL   BUN 9 6 - 20 mg/dL   Creatinine, Ser 0.59 0.44 - 1.00  mg/dL   Calcium 8.4 (L) 8.9 - 10.3 mg/dL   GFR calc non Af Amer >60 >60 mL/min   GFR calc Af Amer >60 >60 mL/min   Anion gap 9 5 - 15  Glucose, capillary   Collection Time: 12/23/17 12:18 PM  Result Value Ref Range   Glucose-Capillary 83 70 - 99 mg/dL  Glucose, capillary   Collection Time: 12/23/17  2:08 PM  Result Value Ref Range   Glucose-Capillary 79 70 - 99 mg/dL  Glucose, capillary   Collection Time: 12/23/17  7:33 PM  Result Value Ref Range   Glucose-Capillary 74 70 - 99 mg/dL  Glucose, capillary   Collection Time: 12/23/17  9:56 PM  Result Value Ref Range   Glucose-Capillary 119 (H) 70 - 99 mg/dL  Basic metabolic panel   Collection Time: 12/24/17  5:54 AM  Result Value Ref Range   Sodium 134 (L) 135 - 145 mmol/L   Potassium 3.5 3.5 - 5.1 mmol/L   Chloride 102 98 - 111 mmol/L   CO2 23 22 - 32 mmol/L   Glucose, Bld 81 70 - 99 mg/dL   BUN 12 6 - 20 mg/dL   Creatinine, Ser 0.64 0.44 - 1.00 mg/dL   Calcium 8.5 (L) 8.9 - 10.3 mg/dL   GFR calc non Af Amer >60 >60 mL/min   GFR calc Af Amer >60 >60 mL/min   Anion gap 9 5 - 15  Glucose, capillary   Collection Time: 12/24/17  8:59 AM  Result Value Ref  Range   Glucose-Capillary 73 70 - 99 mg/dL   BMP Latest Ref Rng & Units 12/24/2017 12/23/2017 12/22/2017  Glucose 70 - 99 mg/dL 81 71 89  BUN 6 - 20 mg/dL 12 9 10   Creatinine 0.44 - 1.00 mg/dL 0.64 0.59 0.56  BUN/Creat Ratio 9 - 23 - - -  Sodium 135 - 145 mmol/L 134(L) 135 135  Potassium 3.5 - 5.1 mmol/L 3.5 3.3(L) 3.8  Chloride 98 - 111 mmol/L 102 102 103  CO2 22 - 32 mmol/L 23 24 24   Calcium 8.9 - 10.3 mg/dL 8.5(L) 8.4(L) 8.4(L)   CBG (last 3)  Recent Labs    12/23/17 1933 12/23/17 2156 12/24/17 0859  GLUCAP 74 119* 73    Imaging Studies:     Medications:  Scheduled . aspirin  81 mg Oral Daily  . docusate sodium  100 mg Oral Daily  . doxylamine (Sleep)  25 mg Oral Daily  . insulin aspart  7 Units Subcutaneous TID WC  . insulin glargine  25 Units Subcutaneous  QHS  . labetalol  600 mg Oral Q8H  . neomycin-bacitracin-polymyxin   Topical BID  . NIFEdipine  60 mg Oral BID  . potassium chloride  20 mEq Oral BID  . prenatal multivitamin  1 tablet Oral Q1200  . pyridOXINE  50 mg Oral Daily   I have reviewed the patient's current medications.  ASSESSMENT: Patient Active Problem List   Diagnosis Date Noted  . Diabetes mellitus complicating pregnancy, antepartum 12/22/2017  . Intrauterine growth restriction (IUGR) affecting care of mother 12/22/2017  . 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 Pregnancy complicated by multiple fetal congenital anomalies 10/30/2017  . Severe hyperemesis gravidarum 10/30/2017  . Ventricular septal defect (VSD) of fetus in singleton pregnancy, antepartum 09/30/2017  . Chronic hypertension 08/21/2017  . Depression during pregnancy, antepartum 07/07/2017  . Supervision of high risk pregnancy, antepartum 07/07/2017  . Type 1 diabetes mellitus affecting pregnancy, antepartum 07/07/2017  . Chronic hypertension affecting pregnancy 07/07/2017  . Diabetic retinopathy ([place]) 06/09/2017  . Acute depression 11/02/2014  . Asthma 10/30/2013    PLAN: 1. inpt care til 36 wk, then IOL due to uncontrolled Type I DM 2. Daily bmet 3 Daily weight 216 admit now [place] 12/24/2017,9:51 AM    Patient ID: Barbara Donovan, female   DOB: [birth year], 27 y.o.   MRN: [id number]

## 2017-12-24 NOTE — Progress Notes (Signed)
Dr. Elly Modena called to review strip and see if pt cant come off fetal monitor.   Reviewed strip and dr said she can come off monitor.

## 2017-12-25 LAB — BASIC METABOLIC PANEL
Anion gap: 8 (ref 5–15)
BUN: 20 mg/dL (ref 6–20)
CHLORIDE: 102 mmol/L (ref 98–111)
CO2: 25 mmol/L (ref 22–32)
Calcium: 8.5 mg/dL — ABNORMAL LOW (ref 8.9–10.3)
Creatinine, Ser: 0.72 mg/dL (ref 0.44–1.00)
GFR calc Af Amer: >60 mL/min (ref >60)
GFR calc non Af Amer: >60 mL/min (ref >60)
Glucose, Bld: 113 mg/dL — ABNORMAL HIGH (ref 70–99)
Potassium: 3.9 mmol/L (ref 3.5–5.1)
Sodium: 135 mmol/L (ref 135–145)

## 2017-12-25 LAB — TYPE AND SCREEN
ABO/RH(D): A POS
ANTIBODY SCREEN: NEGATIVE

## 2017-12-25 LAB — GLUCOSE, CAPILLARY
Glucose-Capillary: 90 mg/dL (ref 70–99)
Glucose-Capillary: 78 mg/dL (ref 70–99)
Glucose-Capillary: 114 mg/dL — ABNORMAL HIGH (ref 70–99)
Glucose-Capillary: 118 mg/dL — ABNORMAL HIGH (ref 70–99)
Glucose-Capillary: 134 mg/dL — ABNORMAL HIGH (ref 70–99)

## 2017-12-25 MED ORDER — HYDRALAZINE HCL 20 MG/ML IJ SOLN
10.0000 mg | Freq: Once | INTRAMUSCULAR | Status: AC
  Administered 2017-12-25: 10 mg via INTRAVENOUS
  Filled 2017-12-25: qty 1

## 2017-12-25 NOTE — Progress Notes (Signed)
Patient ID: Barbara Donovan, female   DOB: 1990-12-15, 27 y.o.   MRN: [id number] [place]) NOTE  Barbara Donovan is a 27 y.o. G1P0 at [redacted]w[redacted]d b who is admitted for shortness of breath with marked edema, poorly controlled t-1 DM, .   Fetal presentation is cephalic. Length of Stay:  3  Days  Subjective: Pt comfortable , no sob Patient reports the fetal movement as active. Patient reports uterine contraction  activity as none. Patient reports  vaginal bleeding as none. Patient describes fluid per vagina as None.  Vitals:  Blood pressure 132/90, pulse 86, temperature 97.7 F (36.5 C), temperature source Oral, resp. rate 18, height 5\' 6"  (1.676 m), weight 93.5 kg, last menstrual period 04/02/2017, SpO2 99 %. Physical Examination: weight 206 down from 216 on admit  General appearance - alert, well appearing, and in no distress and overweight Heart - normal rate and regular rhythm Abdomen - soft, nontender, nondistended Fundal Height:  size equals dates Cervical Exam: Not evaluated.  Extremities: extremities normal, atraumatic, no cyanosis or edema and Homans sign is negative, no sign of DVT with DTRs 2+ bilaterally Membranes:intact  Fetal Monitoring:  Baseline:  bpm, Variability: Good {> 6 bpm), Accelerations: Reactive, Decelerations: Absent and baseline 130 accels to 150  Labs:  Results for orders placed or performed during the hospital encounter of 12/22/17 (from the past 24 hour(s))  Glucose, capillary   Collection Time: 12/24/17 10:58 AM  Result Value Ref Range   Glucose-Capillary 80 70 - 99 mg/dL  Glucose, capillary   Collection Time: 12/24/17  2:57 PM  Result Value Ref Range   Glucose-Capillary 88 70 - 99 mg/dL  Glucose, capillary   Collection Time: 12/24/17  5:01 PM  Result Value Ref Range   Glucose-Capillary 100 (H) 70 - 99 mg/dL   Comment 1 Notify RN   Glucose, capillary   Collection Time: 12/24/17  7:46 PM  Result Value Ref Range   Glucose-Capillary 82 70 - 99 mg/dL  Glucose, capillary   Collection Time: 12/24/17 11:13 PM  Result Value Ref Range   Glucose-Capillary 110 (H) 70 - 99 mg/dL  Basic metabolic panel   Collection Time: 12/25/17  5:26 AM  Result Value Ref Range   Sodium 135 135 - 145 mmol/L   Potassium 3.9 3.5 - 5.1 mmol/L   Chloride 102 98 - 111 mmol/L   CO2 25 22 - 32 mmol/L   Glucose, Bld 113 (H) 70 - 99 mg/dL   BUN 20 6 - 20 mg/dL   Creatinine, Ser 0.72 0.44 - 1.00 mg/dL   Calcium 8.5 (L) 8.9 - 10.3 mg/dL   GFR calc non Af Amer >60 >60 mL/min   GFR calc Af Amer >60 >60 mL/min   Anion gap 8 5 - 15  Glucose, capillary   Collection Time: 12/25/17  9:27 AM  Result Value Ref Range   Glucose-Capillary 90 70 - 99 mg/dL    Imaging Studies:   LV EF: 60% -   65% on mother  Medications:  Scheduled . aspirin  81 mg Oral Daily  . docusate sodium  100 mg Oral Daily  . doxylamine (Sleep)  25 mg Oral Daily  . insulin aspart  7 Units Subcutaneous TID WC  . insulin glargine  25 Units Subcutaneous QHS  . labetalol  600 mg Oral Q8H  . neomycin-bacitracin-polymyxin   Topical BID  . NIFEdipine  60 mg Oral BID  . potassium chloride  20 mEq Oral BID  . prenatal multivitamin  1 tablet Oral Q1200  . pyridOXINE  50 mg Oral Daily   I have reviewed the patient's current medications.  ASSESSMENT: Patient Active Problem List   Diagnosis Date Noted  . Diabetes mellitus complicating pregnancy, antepartum 12/22/2017  . Intrauterine growth restriction (IUGR) affecting care of mother 12/22/2017  . Proteinuria due to type 1 diabetes mellitus ([place]) 11/02/2017  . Pregnancy complicated by multiple fetal congenital anomalies 10/30/2017  . Severe hyperemesis gravidarum 10/30/2017  . Ventricular septal defect (VSD) of fetus in singleton pregnancy, antepartum 09/30/2017  . Chronic hypertension 08/21/2017  . Depression during pregnancy, antepartum 07/07/2017  . Supervision of high risk pregnancy, antepartum 07/07/2017  .  Type 1 diabetes mellitus affecting pregnancy, antepartum 07/07/2017  . Chronic hypertension affecting pregnancy 07/07/2017  . Diabetic retinopathy ([place]) 06/09/2017  . Acute depression 11/02/2014  . Asthma 10/30/2013    PLAN: Neonatology consult still pending Here as inpt till delivery at 36.0 Good diabetic control at present  Jonnie Kind 12/25/2017,10:50 AM

## 2017-12-26 ENCOUNTER — Encounter (HOSPITAL_COMMUNITY)
Admission: AD | Disposition: A | Discharge status: Home / Self Care | Payer: Self-pay | Source: Home / Self Care | Attending: Obstetrics & Gynecology

## 2017-12-26 ENCOUNTER — Inpatient Hospital Stay (HOSPITAL_COMMUNITY): Payer: BC Managed Care – PPO | Admitting: Certified Registered Nurse Anesthetist

## 2017-12-26 ENCOUNTER — Encounter (HOSPITAL_COMMUNITY): Payer: Self-pay | Admitting: *Deleted

## 2017-12-26 ENCOUNTER — Inpatient Hospital Stay (HOSPITAL_BASED_OUTPATIENT_CLINIC_OR_DEPARTMENT_OTHER): Payer: BC Managed Care – PPO

## 2017-12-26 DIAGNOSIS — Z3A35 35 weeks gestation of pregnancy: Secondary | ICD-10-CM

## 2017-12-26 DIAGNOSIS — O10013 Pre-existing essential hypertension complicating pregnancy, third trimester: Secondary | ICD-10-CM

## 2017-12-26 DIAGNOSIS — O24013 Pre-existing diabetes mellitus, type 1, in pregnancy, third trimester: Secondary | ICD-10-CM

## 2017-12-26 LAB — BASIC METABOLIC PANEL
Anion gap: 8 (ref 5–15)
BUN: 25 mg/dL — ABNORMAL HIGH (ref 6–20)
CO2: 23 mmol/L (ref 22–32)
Calcium: 8.5 mg/dL — ABNORMAL LOW (ref 8.9–10.3)
Chloride: 102 mmol/L (ref 98–111)
Creatinine, Ser: 0.73 mg/dL (ref 0.44–1.00)
GFR calc Af Amer: >60 mL/min (ref >60)
GFR calc non Af Amer: >60 mL/min (ref >60)
Glucose, Bld: 154 mg/dL — ABNORMAL HIGH (ref 70–99)
Potassium: 3.9 mmol/L (ref 3.5–5.1)
Sodium: 133 mmol/L — ABNORMAL LOW (ref 135–145)

## 2017-12-26 LAB — CBC
HCT: 29.5 % — ABNORMAL LOW (ref 36.0–46.0)
Hemoglobin: 9.8 g/dL — ABNORMAL LOW (ref 12.0–15.0)
MCH: 28.1 pg (ref 26.0–34.0)
MCHC: 33.2 g/dL (ref 30.0–36.0)
MCV: 84.5 fL (ref 80.0–100.0)
Platelets: 392 10*3/uL (ref 150–400)
RBC: 3.49 MIL/uL — ABNORMAL LOW (ref 3.87–5.11)
RDW: 13.3 % (ref 11.5–15.5)
WBC: 7.1 10*3/uL (ref 4.0–10.5)
nRBC: 0 % (ref 0.0–0.2)

## 2017-12-26 LAB — COMPREHENSIVE METABOLIC PANEL
ALBUMIN: 1.6 g/dL — AB (ref 3.5–5.0)
ALT: 17 U/L (ref 0–44)
AST: 18 U/L (ref 15–41)
Alkaline Phosphatase: 53 U/L (ref 38–126)
Anion gap: 9 (ref 5–15)
BILIRUBIN TOTAL: 0.6 mg/dL (ref 0.3–1.2)
BUN: 23 mg/dL — ABNORMAL HIGH (ref 6–20)
CO2: 22 mmol/L (ref 22–32)
Calcium: 8.6 mg/dL — ABNORMAL LOW (ref 8.9–10.3)
Chloride: 104 mmol/L (ref 98–111)
Creatinine, Ser: 0.62 mg/dL (ref 0.44–1.00)
GFR calc Af Amer: >60 mL/min (ref >60)
GFR calc non Af Amer: >60 mL/min (ref >60)
Glucose, Bld: 126 mg/dL — ABNORMAL HIGH (ref 70–99)
Potassium: 4.1 mmol/L (ref 3.5–5.1)
Sodium: 135 mmol/L (ref 135–145)
Total Protein: 5.5 g/dL — ABNORMAL LOW (ref 6.5–8.1)

## 2017-12-26 LAB — GLUCOSE, CAPILLARY
Glucose-Capillary: 142 mg/dL — ABNORMAL HIGH (ref 70–99)
Glucose-Capillary: 59 mg/dL — ABNORMAL LOW (ref 70–99)
Glucose-Capillary: 97 mg/dL (ref 70–99)

## 2017-12-26 IMAGING — US US MFM FETAL BPP W/O NON-STRESS
2 series · 15 of 28 positions shown · non-contrast
Comparison: none

[Series 1: us mfm fetal bpp w/o non-stress · 23 acquisitions, 11 frames shown (1 of 2)]
[im 1/23]
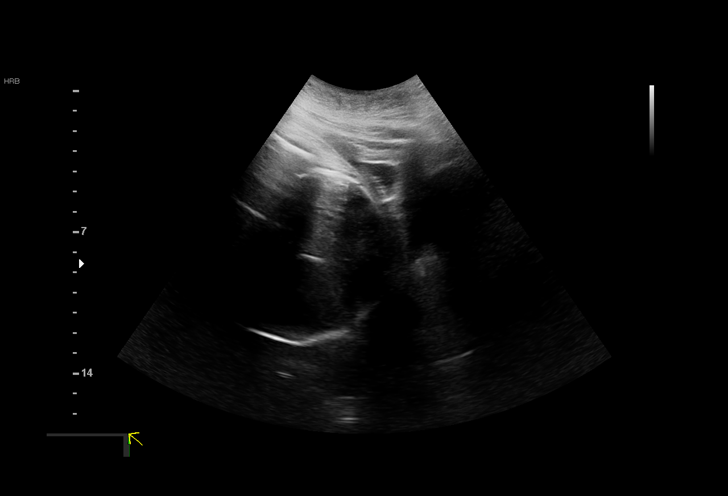
[im 3/23]
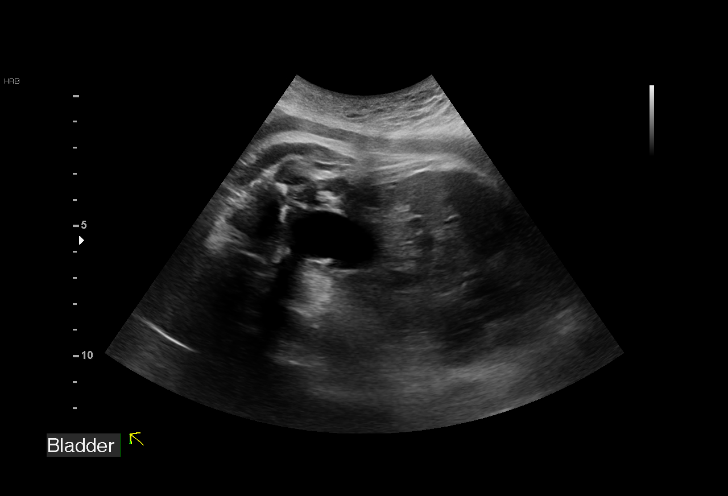
[im 5/23]
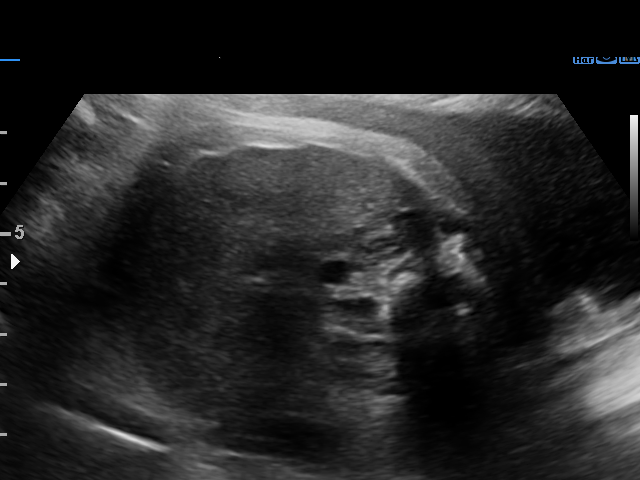
[im 7/23]
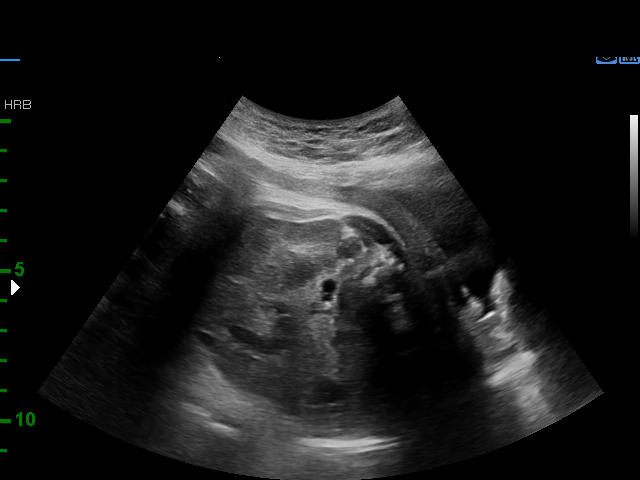
[im 10/23]
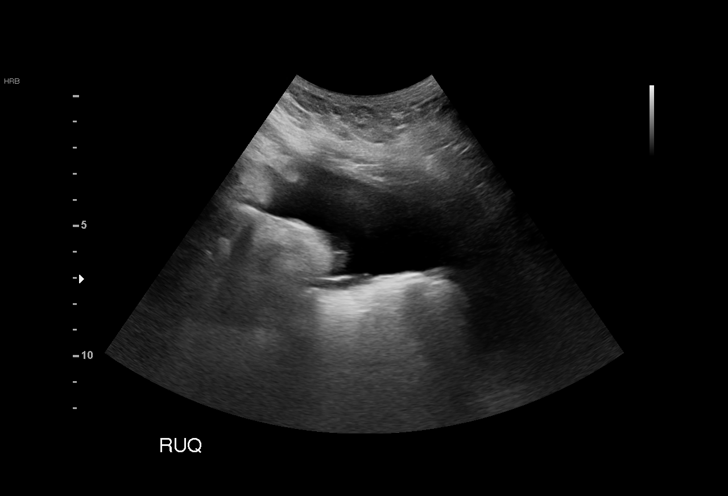
[im 12/23]
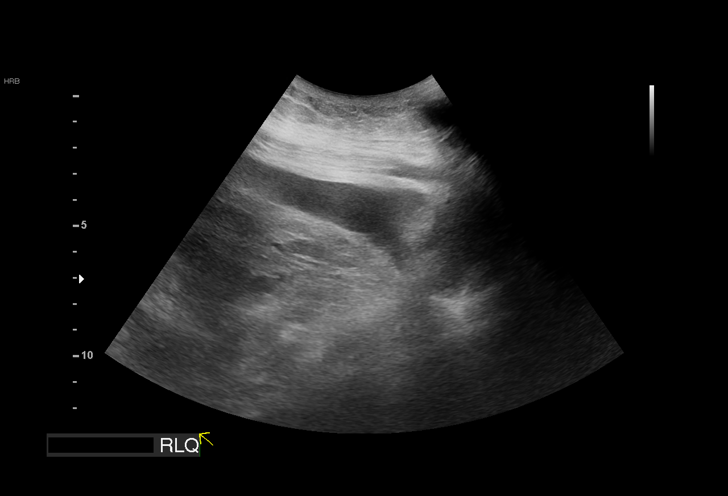
[im 14/23]
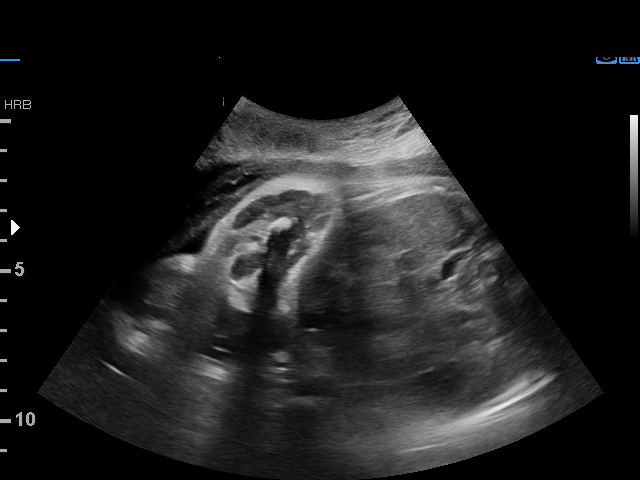
[im 17/23]
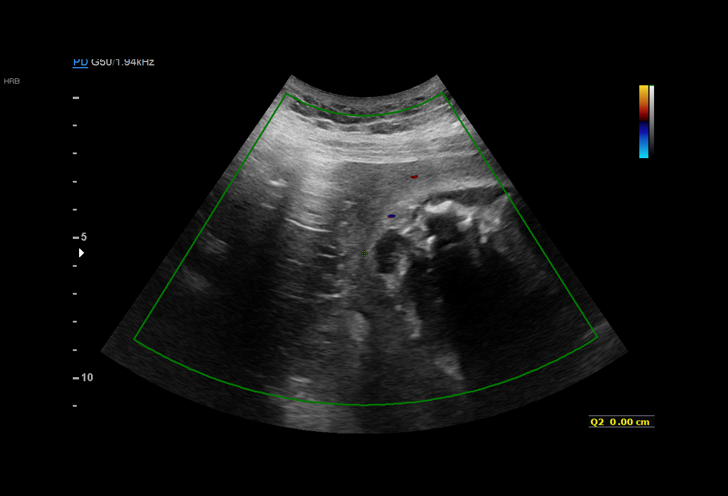
[im 18/23]
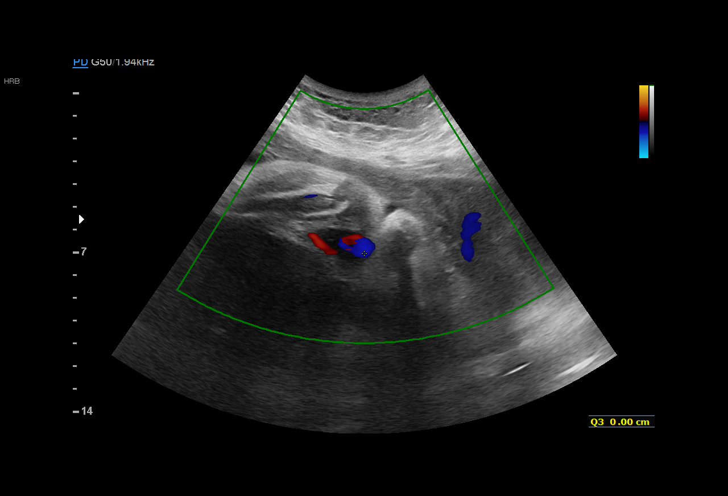
[im 20/23]
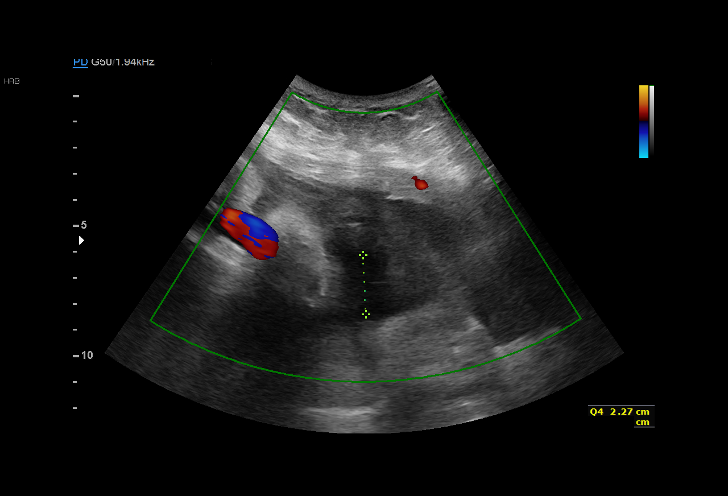
[im 23/23]
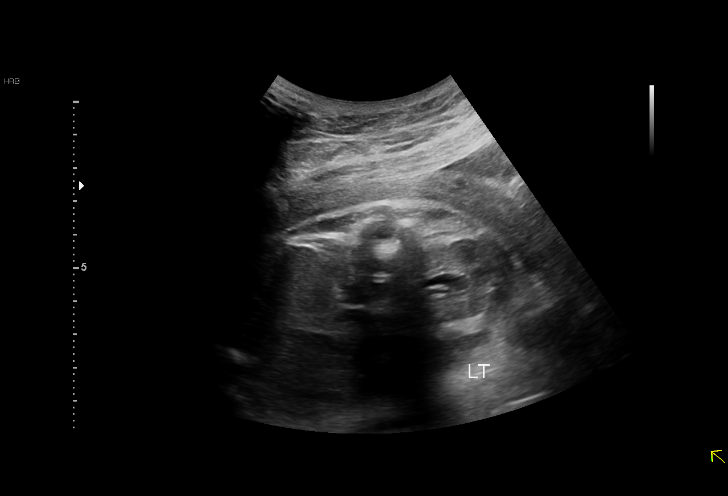

[Series 3: us mfm fetal bpp w/o non-stress · 10 acquisitions, 4 frames shown (2 of 2)]
[im 2/10]
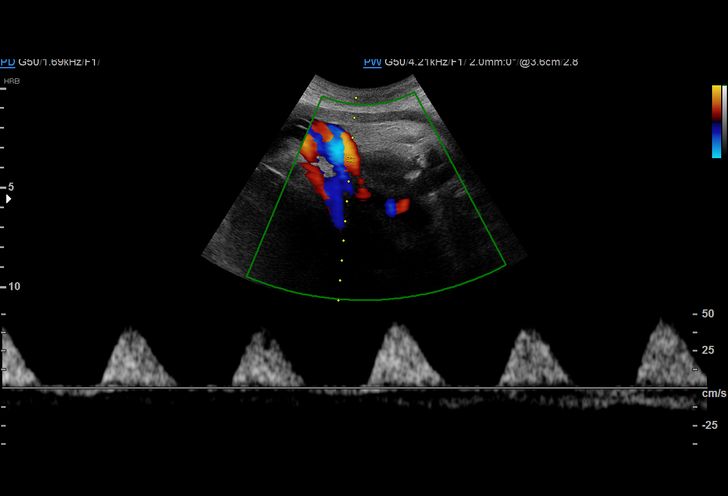
[im 4/10]
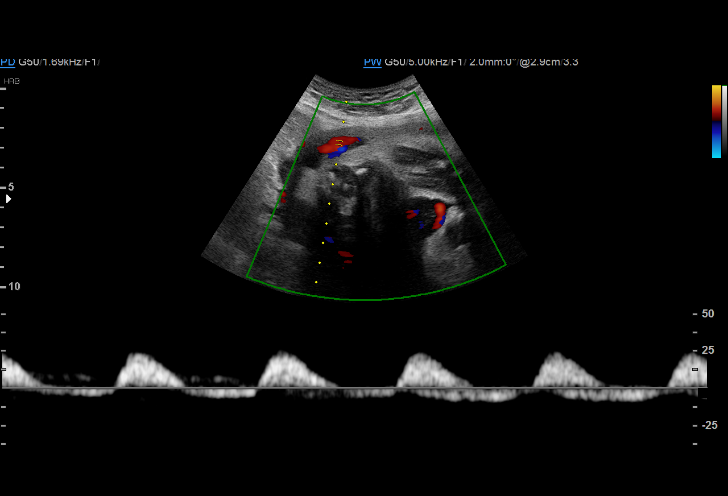
[im 7/10]
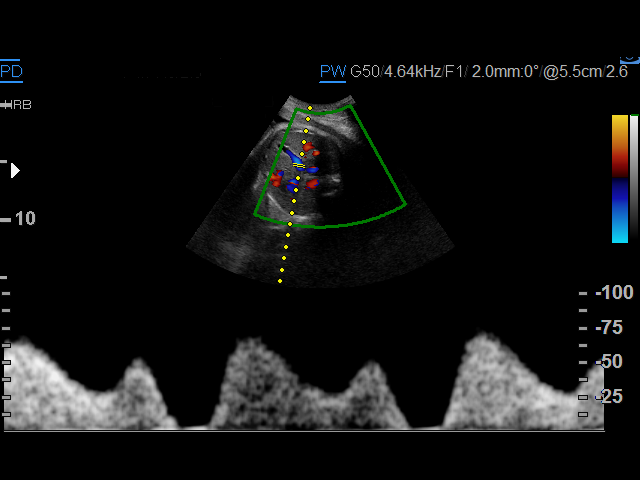
[im 10/10]
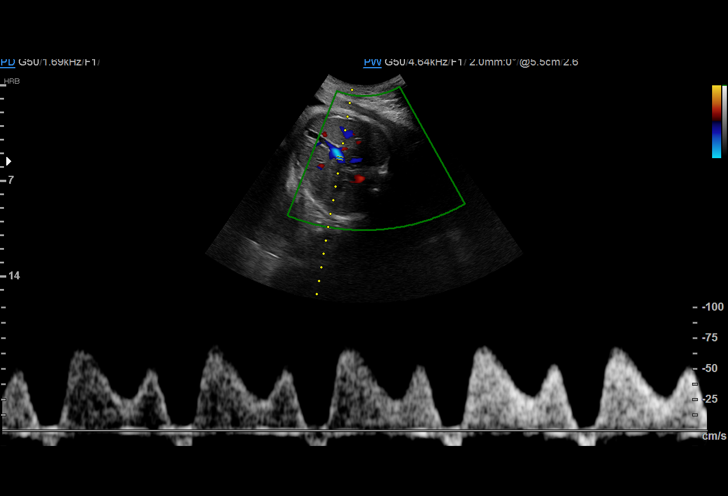

[15 of 28 positions shown; findings below may reference images not displayed]

OB/Gyn Clinic
                   OB/Gyn Clinic
                   [7Q]

  2  US MFM UA CORD DOPPLER               76820.02     SASAKI
 ----------------------------------------------------------------------

 ----------------------------------------------------------------------
Indications

  Pre-existing essential hypertension            [7Q]
  complicating pregnancy, third trimester
  Pre-existing diabetes, type 1, in pregnancy,   [7Q]
  third trimester
  35 weeks gestation of pregnancy
 ----------------------------------------------------------------------
Fetal Evaluation

 Num Of Fetuses:         1
 Fetal Heart Rate(bpm):  138
 Cardiac Activity:       Observed
 Presentation:           Cephalic

 Amniotic Fluid
 AFI FV:      Oligohydramnios
 AFI Sum(cm)     %Tile       Largest Pocket(cm)
 1.14            < 3

 RUQ(cm)       RLQ(cm)       LUQ(cm)        LLQ(cm)
 0             1.14          0              0

 Comment:    maternal ascites visualized in all four quadrant
Biophysical Evaluation

 Amniotic F.V:   Oligohydramnios            F. Tone:        Observed
 F. Movement:    Observed                   Score:          [DATE]
 F. Breathing:   Observed
OB History

 Gravidity:    1         Term:   0        Prem:   0        SAB:   0
 TOP:          0       Ectopic:  0        Living: 0
Gestational Age

 Best:          35w 5d     Det. By:  Early Ultrasound         EDD:   [DATE]
                                     ([DATE])
Anatomy

 Thoracic:              Appears normal         Kidneys:                Absent right kidney
 Stomach:               Appears normal, left   Bladder:                Appears normal
                        sided
Doppler - Fetal Vessels

 Umbilical Artery
                                                           ADFV    RDFV
                                                             Yes     Yes

 Ductus Venosus

                                             Reversed

Cervix Uterus Adnexa

 Cervix
 Not visualized (advanced GA >[7Q])
Impression

 Live late preterm fetus with oligohydramnios and abnormal
 umbilical artery and ductus venosus velocimetry.
Recommendations

 Continuous monitoring
 Delivery
                SASAKI

## 2017-12-26 SURGERY — Surgical Case
Anesthesia: Spinal

## 2017-12-26 MED ORDER — MENTHOL 3 MG MT LOZG: 1.0000 | LOZENGE | OROMUCOSAL | Status: DC | PRN

## 2017-12-26 MED ORDER — MORPHINE SULFATE (PF) 0.5 MG/ML IJ SOLN
INTRAMUSCULAR | Status: AC
  Filled 2017-12-26: qty 10

## 2017-12-26 MED ORDER — FLEET ENEMA 7-19 GM/118ML RE ENEM: 1.0000 | ENEMA | Freq: Every day | RECTAL | Status: DC | PRN

## 2017-12-26 MED ORDER — INSULIN ASPART 100 UNIT/ML ~~LOC~~ SOLN
0.0000 [IU] | Freq: Three times a day (TID) | SUBCUTANEOUS | Status: DC
  Administered 2017-12-26 – 2017-12-29 (×4): 2 [IU] via SUBCUTANEOUS

## 2017-12-26 MED ORDER — DEXTROSE 50 % IV SOLN
6.2500 g | Freq: Once | INTRAVENOUS | Status: AC
  Administered 2017-12-26: 6.25 g via INTRAVENOUS

## 2017-12-26 MED ORDER — MEPERIDINE HCL 25 MG/ML IJ SOLN: 6.2500 mg | INTRAMUSCULAR | Status: DC | PRN

## 2017-12-26 MED ORDER — OXYTOCIN 40 UNITS IN LACTATED RINGERS INFUSION - SIMPLE MED
1.0000 m[IU]/min | INTRAVENOUS | Status: DC
  Administered 2017-12-26: 1 m[IU]/min via INTRAVENOUS
  Filled 2017-12-26: qty 1000

## 2017-12-26 MED ORDER — FENTANYL CITRATE (PF) 100 MCG/2ML IJ SOLN
INTRAMUSCULAR | Status: AC
  Filled 2017-12-26: qty 2

## 2017-12-26 MED ORDER — SODIUM CHLORIDE 0.9% FLUSH: 3.0000 mL | INTRAVENOUS | Status: DC | PRN

## 2017-12-26 MED ORDER — INSULIN ASPART 100 UNIT/ML ~~LOC~~ SOLN
5.0000 [IU] | Freq: Three times a day (TID) | SUBCUTANEOUS | Status: DC
  Administered 2017-12-27 – 2017-12-29 (×7): 5 [IU] via SUBCUTANEOUS

## 2017-12-26 MED ORDER — SODIUM CHLORIDE 0.9 % IV SOLN
5.0000 10*6.[IU] | Freq: Once | INTRAVENOUS | Status: AC
  Administered 2017-12-26: 5 10*6.[IU] via INTRAVENOUS
  Filled 2017-12-26: qty 5

## 2017-12-26 MED ORDER — MORPHINE SULFATE (PF) 0.5 MG/ML IJ SOLN
INTRAMUSCULAR | Status: DC | PRN
  Administered 2017-12-26: .15 mg via INTRATHECAL

## 2017-12-26 MED ORDER — ZOLPIDEM TARTRATE 5 MG PO TABS: 5.0000 mg | ORAL_TABLET | Freq: Every evening | ORAL | Status: DC | PRN

## 2017-12-26 MED ORDER — OXYCODONE-ACETAMINOPHEN 5-325 MG PO TABS: 1.0000 | ORAL_TABLET | ORAL | Status: DC | PRN

## 2017-12-26 MED ORDER — NALBUPHINE HCL 10 MG/ML IJ SOLN: 5.0000 mg | INTRAMUSCULAR | Status: DC | PRN

## 2017-12-26 MED ORDER — KETOROLAC TROMETHAMINE 30 MG/ML IJ SOLN
30.0000 mg | Freq: Four times a day (QID) | INTRAMUSCULAR | Status: AC | PRN
  Administered 2017-12-26: 30 mg via INTRAMUSCULAR

## 2017-12-26 MED ORDER — OXYTOCIN BOLUS FROM INFUSION: 500.0000 mL | Freq: Once | INTRAVENOUS | Status: DC

## 2017-12-26 MED ORDER — PENICILLIN G 3 MILLION UNITS IVPB - SIMPLE MED: 3.0000 10*6.[IU] | INTRAVENOUS | Status: DC

## 2017-12-26 MED ORDER — HYDROXYZINE HCL 50 MG PO TABS: 50.0000 mg | ORAL_TABLET | Freq: Four times a day (QID) | ORAL | Status: DC | PRN

## 2017-12-26 MED ORDER — HYDRALAZINE HCL 20 MG/ML IJ SOLN
5.0000 mg | INTRAMUSCULAR | Status: DC | PRN
  Administered 2017-12-26: 5 mg via INTRAVENOUS
  Filled 2017-12-26: qty 1

## 2017-12-26 MED ORDER — DEXAMETHASONE SODIUM PHOSPHATE 10 MG/ML IJ SOLN
INTRAMUSCULAR | Status: DC | PRN
  Administered 2017-12-26: 10 mg via INTRAVENOUS

## 2017-12-26 MED ORDER — BUPIVACAINE HCL (PF) 0.5 % IJ SOLN
INTRAMUSCULAR | Status: AC
  Filled 2017-12-26: qty 30

## 2017-12-26 MED ORDER — LACTATED RINGERS IV SOLN
INTRAVENOUS | Status: DC | PRN
  Administered 2017-12-26: 17:00:00 via INTRAVENOUS

## 2017-12-26 MED ORDER — DIPHENHYDRAMINE HCL 25 MG PO CAPS: 25.0000 mg | ORAL_CAPSULE | Freq: Four times a day (QID) | ORAL | Status: DC | PRN

## 2017-12-26 MED ORDER — ONDANSETRON HCL 4 MG/2ML IJ SOLN
INTRAMUSCULAR | Status: DC | PRN
  Administered 2017-12-26: 4 mg via INTRAVENOUS

## 2017-12-26 MED ORDER — INSULIN GLARGINE 100 UNIT/ML ~~LOC~~ SOLN
12.0000 [IU] | Freq: Every day | SUBCUTANEOUS | Status: DC
  Administered 2017-12-26 – 2017-12-27 (×2): 12 [IU] via SUBCUTANEOUS
  Filled 2017-12-26 (×3): qty 0.12

## 2017-12-26 MED ORDER — ACETAMINOPHEN 10 MG/ML IV SOLN: 1000.0000 mg | Freq: Once | INTRAVENOUS | Status: DC | PRN

## 2017-12-26 MED ORDER — OXYTOCIN 10 UNIT/ML IJ SOLN
INTRAVENOUS | Status: DC | PRN
  Administered 2017-12-26: 40 [IU] via INTRAVENOUS

## 2017-12-26 MED ORDER — ALBUMIN HUMAN 5 % IV SOLN
INTRAVENOUS | Status: DC | PRN
  Administered 2017-12-26: 17:00:00 via INTRAVENOUS

## 2017-12-26 MED ORDER — PHENYLEPHRINE 8 MG IN D5W 100 ML (0.08MG/ML) PREMIX OPTIME
INJECTION | INTRAVENOUS | Status: DC | PRN
  Administered 2017-12-26: 60 ug/min via INTRAVENOUS

## 2017-12-26 MED ORDER — OXYTOCIN 40 UNITS IN LACTATED RINGERS INFUSION - SIMPLE MED: 2.5000 [IU]/h | INTRAVENOUS | Status: DC

## 2017-12-26 MED ORDER — NALBUPHINE HCL 10 MG/ML IJ SOLN: 5.0000 mg | Freq: Once | INTRAMUSCULAR | Status: DC | PRN

## 2017-12-26 MED ORDER — LABETALOL HCL 5 MG/ML IV SOLN: 20.0000 mg | INTRAVENOUS | Status: DC | PRN

## 2017-12-26 MED ORDER — BUPIVACAINE HCL (PF) 0.5 % IJ SOLN
INTRAMUSCULAR | Status: DC | PRN
  Administered 2017-12-26: 30 mL

## 2017-12-26 MED ORDER — TETANUS-DIPHTH-ACELL PERTUSSIS 5-2.5-18.5 LF-MCG/0.5 IM SUSP: 0.5000 mL | Freq: Once | INTRAMUSCULAR | Status: DC

## 2017-12-26 MED ORDER — DEXAMETHASONE SODIUM PHOSPHATE 10 MG/ML IJ SOLN
INTRAMUSCULAR | Status: AC
  Filled 2017-12-26: qty 1

## 2017-12-26 MED ORDER — KETOROLAC TROMETHAMINE 30 MG/ML IJ SOLN
INTRAMUSCULAR | Status: AC
  Filled 2017-12-26: qty 1

## 2017-12-26 MED ORDER — DEXTROSE 50 % IV SOLN
INTRAVENOUS | Status: AC
  Filled 2017-12-26: qty 50

## 2017-12-26 MED ORDER — SOD CITRATE-CITRIC ACID 500-334 MG/5ML PO SOLN
30.0000 mL | ORAL | Status: DC | PRN
  Administered 2017-12-26: 30 mL via ORAL
  Filled 2017-12-26: qty 15

## 2017-12-26 MED ORDER — CEFAZOLIN SODIUM-DEXTROSE 2-3 GM-%(50ML) IV SOLR
INTRAVENOUS | Status: DC | PRN
  Administered 2017-12-26: 2 g via INTRAVENOUS

## 2017-12-26 MED ORDER — PROPOFOL 10 MG/ML IV BOLUS
INTRAVENOUS | Status: AC
  Filled 2017-12-26: qty 20

## 2017-12-26 MED ORDER — SCOPOLAMINE 1 MG/3DAYS TD PT72
1.0000 | MEDICATED_PATCH | Freq: Once | TRANSDERMAL | Status: DC
  Administered 2017-12-26: 1.5 mg via TRANSDERMAL

## 2017-12-26 MED ORDER — WITCH HAZEL-GLYCERIN EX PADS: 1.0000 "application " | MEDICATED_PAD | CUTANEOUS | Status: DC | PRN

## 2017-12-26 MED ORDER — OXYTOCIN 10 UNIT/ML IJ SOLN
INTRAMUSCULAR | Status: AC
  Filled 2017-12-26: qty 4

## 2017-12-26 MED ORDER — ACETAMINOPHEN 325 MG PO TABS
650.0000 mg | ORAL_TABLET | ORAL | Status: DC | PRN
  Administered 2017-12-26: 650 mg via ORAL
  Filled 2017-12-26: qty 2

## 2017-12-26 MED ORDER — DIBUCAINE 1 % RE OINT: 1.0000 "application " | TOPICAL_OINTMENT | RECTAL | Status: DC | PRN

## 2017-12-26 MED ORDER — PHENYLEPHRINE 8 MG IN D5W 100 ML (0.08MG/ML) PREMIX OPTIME
INJECTION | INTRAVENOUS | Status: AC
  Filled 2017-12-26: qty 100

## 2017-12-26 MED ORDER — SCOPOLAMINE 1 MG/3DAYS TD PT72
MEDICATED_PATCH | TRANSDERMAL | Status: AC
  Filled 2017-12-26: qty 1

## 2017-12-26 MED ORDER — SIMETHICONE 80 MG PO CHEW
80.0000 mg | CHEWABLE_TABLET | Freq: Three times a day (TID) | ORAL | Status: DC
  Administered 2017-12-26 – 2017-12-29 (×8): 80 mg via ORAL
  Filled 2017-12-26 (×7): qty 1

## 2017-12-26 MED ORDER — LACTATED RINGERS IV SOLN: 500.0000 mL | INTRAVENOUS | Status: DC | PRN

## 2017-12-26 MED ORDER — HYDRALAZINE HCL 20 MG/ML IJ SOLN: 10.0000 mg | INTRAMUSCULAR | Status: DC | PRN

## 2017-12-26 MED ORDER — PROMETHAZINE HCL 25 MG/ML IJ SOLN: 6.2500 mg | INTRAMUSCULAR | Status: DC | PRN

## 2017-12-26 MED ORDER — LIDOCAINE HCL (PF) 1 % IJ SOLN: 30.0000 mL | INTRAMUSCULAR | Status: DC | PRN

## 2017-12-26 MED ORDER — NALOXONE HCL 4 MG/10ML IJ SOLN: 1.0000 ug/kg/h | INTRAVENOUS | Status: DC | PRN

## 2017-12-26 MED ORDER — DIPHENHYDRAMINE HCL 25 MG PO CAPS: 25.0000 mg | ORAL_CAPSULE | ORAL | Status: DC | PRN

## 2017-12-26 MED ORDER — HYDROMORPHONE HCL 1 MG/ML IJ SOLN: 0.2500 mg | INTRAMUSCULAR | Status: DC | PRN

## 2017-12-26 MED ORDER — INSULIN ASPART 100 UNIT/ML ~~LOC~~ SOLN
0.0000 [IU] | Freq: Four times a day (QID) | SUBCUTANEOUS | Status: DC | PRN
  Administered 2017-12-26: 3 [IU] via SUBCUTANEOUS
  Filled 2017-12-26: qty 0.16

## 2017-12-26 MED ORDER — FENTANYL CITRATE (PF) 100 MCG/2ML IJ SOLN
INTRAMUSCULAR | Status: DC | PRN
  Administered 2017-12-26: 15 ug via INTRATHECAL

## 2017-12-26 MED ORDER — DEXTROSE 50 % IV SOLN
INTRAVENOUS | Status: DC | PRN
  Administered 2017-12-26: 6.25 g via INTRAVENOUS

## 2017-12-26 MED ORDER — COCONUT OIL OIL
1.0000 "application " | TOPICAL_OIL | Status: DC | PRN
  Administered 2017-12-28: 1 via TOPICAL
  Filled 2017-12-26: qty 120

## 2017-12-26 MED ORDER — LACTATED RINGERS IV SOLN
INTRAVENOUS | Status: DC
  Administered 2017-12-26: 10:00:00 via INTRAVENOUS

## 2017-12-26 MED ORDER — SENNOSIDES-DOCUSATE SODIUM 8.6-50 MG PO TABS
2.0000 | ORAL_TABLET | ORAL | Status: DC
  Administered 2017-12-27 – 2017-12-28 (×2): 2 via ORAL
  Filled 2017-12-26 (×2): qty 2

## 2017-12-26 MED ORDER — FENTANYL CITRATE (PF) 100 MCG/2ML IJ SOLN: 50.0000 ug | INTRAMUSCULAR | Status: DC | PRN

## 2017-12-26 MED ORDER — ENOXAPARIN SODIUM 40 MG/0.4ML ~~LOC~~ SOLN
40.0000 mg | SUBCUTANEOUS | Status: DC
  Administered 2017-12-27 – 2017-12-29 (×3): 40 mg via SUBCUTANEOUS
  Filled 2017-12-26 (×3): qty 0.4

## 2017-12-26 MED ORDER — SIMETHICONE 80 MG PO CHEW
80.0000 mg | CHEWABLE_TABLET | ORAL | Status: DC | PRN
  Filled 2017-12-26: qty 1

## 2017-12-26 MED ORDER — KETOROLAC TROMETHAMINE 30 MG/ML IJ SOLN: 30.0000 mg | Freq: Four times a day (QID) | INTRAMUSCULAR | Status: AC | PRN

## 2017-12-26 MED ORDER — DIPHENHYDRAMINE HCL 50 MG/ML IJ SOLN
INTRAMUSCULAR | Status: AC
  Filled 2017-12-26: qty 1

## 2017-12-26 MED ORDER — OXYTOCIN 40 UNITS IN LACTATED RINGERS INFUSION - SIMPLE MED
2.5000 [IU]/h | INTRAVENOUS | Status: AC
  Administered 2017-12-26: 2.5 [IU]/h via INTRAVENOUS

## 2017-12-26 MED ORDER — NALOXONE HCL 0.4 MG/ML IJ SOLN: 0.4000 mg | INTRAMUSCULAR | Status: DC | PRN

## 2017-12-26 MED ORDER — ONDANSETRON HCL 4 MG/2ML IJ SOLN: 4.0000 mg | Freq: Four times a day (QID) | INTRAMUSCULAR | Status: DC | PRN

## 2017-12-26 MED ORDER — LABETALOL HCL 200 MG PO TABS
600.0000 mg | ORAL_TABLET | Freq: Three times a day (TID) | ORAL | Status: DC
  Administered 2017-12-26 – 2017-12-27 (×3): 600 mg via ORAL
  Filled 2017-12-26 (×3): qty 3

## 2017-12-26 MED ORDER — DIPHENHYDRAMINE HCL 50 MG/ML IJ SOLN
12.5000 mg | INTRAMUSCULAR | Status: DC | PRN
  Administered 2017-12-26: 12.5 mg via INTRAVENOUS

## 2017-12-26 MED ORDER — LACTATED RINGERS IV SOLN: INTRAVENOUS | Status: DC

## 2017-12-26 MED ORDER — OXYCODONE-ACETAMINOPHEN 5-325 MG PO TABS: 2.0000 | ORAL_TABLET | ORAL | Status: DC | PRN

## 2017-12-26 MED ORDER — ONDANSETRON HCL 4 MG/2ML IJ SOLN
INTRAMUSCULAR | Status: AC
  Filled 2017-12-26: qty 2

## 2017-12-26 MED ORDER — ONDANSETRON HCL 4 MG/2ML IJ SOLN: 4.0000 mg | Freq: Three times a day (TID) | INTRAMUSCULAR | Status: DC | PRN

## 2017-12-26 MED ORDER — PRENATAL MULTIVITAMIN CH
1.0000 | ORAL_TABLET | Freq: Every day | ORAL | Status: DC
  Administered 2017-12-27 – 2017-12-28 (×2): 1 via ORAL
  Filled 2017-12-26 (×2): qty 1

## 2017-12-26 MED ORDER — TERBUTALINE SULFATE 1 MG/ML IJ SOLN
0.2500 mg | Freq: Once | INTRAMUSCULAR | Status: AC | PRN
  Administered 2017-12-26: 0.25 mg via SUBCUTANEOUS
  Filled 2017-12-26: qty 1

## 2017-12-26 MED ORDER — FUROSEMIDE 40 MG PO TABS
40.0000 mg | ORAL_TABLET | Freq: Two times a day (BID) | ORAL | Status: DC
  Administered 2017-12-27 – 2017-12-29 (×5): 40 mg via ORAL
  Filled 2017-12-26 (×5): qty 1

## 2017-12-26 MED ORDER — LABETALOL HCL 5 MG/ML IV SOLN: 40.0000 mg | INTRAVENOUS | Status: DC | PRN

## 2017-12-26 MED ORDER — HYDROCODONE-ACETAMINOPHEN 7.5-325 MG PO TABS: 1.0000 | ORAL_TABLET | Freq: Once | ORAL | Status: DC | PRN

## 2017-12-26 MED ORDER — MISOPROSTOL 25 MCG QUARTER TABLET
25.0000 ug | ORAL_TABLET | ORAL | Status: DC | PRN
  Administered 2017-12-26: 25 ug via VAGINAL
  Filled 2017-12-26: qty 1

## 2017-12-26 MED ORDER — BUPIVACAINE IN DEXTROSE 0.75-8.25 % IT SOLN
INTRATHECAL | Status: DC | PRN
  Administered 2017-12-26: 1.6 mL via INTRATHECAL

## 2017-12-26 SURGICAL SUPPLY — 36 items
BENZOIN TINCTURE PRP APPL 2/3 (GAUZE/BANDAGES/DRESSINGS) ×3 IMPLANT
CHLORAPREP W/TINT 26ML (MISCELLANEOUS) ×3 IMPLANT
CLAMP CORD UMBIL (MISCELLANEOUS) IMPLANT
CLOSURE WOUND 1/2 X4 (GAUZE/BANDAGES/DRESSINGS) ×1
CLOTH BEACON ORANGE TIMEOUT ST (SAFETY) ×3 IMPLANT
DERMABOND ADHESIVE PROPEN (GAUZE/BANDAGES/DRESSINGS) ×2
DERMABOND ADVANCED .7 DNX6 (GAUZE/BANDAGES/DRESSINGS) ×1 IMPLANT
DRSG OPSITE POSTOP 4X10 (GAUZE/BANDAGES/DRESSINGS) ×3 IMPLANT
ELECT REM PT RETURN 9FT ADLT (ELECTROSURGICAL) ×3
ELECTRODE REM PT RTRN 9FT ADLT (ELECTROSURGICAL) ×1 IMPLANT
EXTRACTOR VACUUM M CUP 4 TUBE (SUCTIONS) IMPLANT
EXTRACTOR VACUUM M CUP 4' TUBE (SUCTIONS)
GLOVE BIOGEL PI IND STRL 7.0 (GLOVE) ×3 IMPLANT
GLOVE BIOGEL PI INDICATOR 7.0 (GLOVE) ×6
GLOVE ECLIPSE 7.0 STRL STRAW (GLOVE) ×3 IMPLANT
GOWN STRL REUS W/TWL LRG LVL3 (GOWN DISPOSABLE) ×6 IMPLANT
KIT ABG SYR 3ML LUER SLIP (SYRINGE) IMPLANT
NEEDLE HYPO 22GX1.5 SAFETY (NEEDLE) ×3 IMPLANT
NEEDLE HYPO 25X5/8 SAFETYGLIDE (NEEDLE) ×3 IMPLANT
NS IRRIG 1000ML POUR BTL (IV SOLUTION) ×3 IMPLANT
PACK C SECTION WH (CUSTOM PROCEDURE TRAY) ×3 IMPLANT
PAD ABD 7.5X8 STRL (GAUZE/BANDAGES/DRESSINGS) ×3 IMPLANT
PAD OB MATERNITY 4.3X12.25 (PERSONAL CARE ITEMS) ×3 IMPLANT
PENCIL SMOKE EVAC W/HOLSTER (ELECTROSURGICAL) ×3 IMPLANT
RTRCTR C-SECT PINK 25CM LRG (MISCELLANEOUS) IMPLANT
SPONGE LAP 18X18 RF (DISPOSABLE) ×9 IMPLANT
STRIP CLOSURE SKIN 1/2X4 (GAUZE/BANDAGES/DRESSINGS) ×2 IMPLANT
SUT PDS AB 0 CTX 36 PDP370T (SUTURE) IMPLANT
SUT PLAIN 2 0 XLH (SUTURE) IMPLANT
SUT VIC AB 0 CTX 36 (SUTURE) ×4
SUT VIC AB 0 CTX36XBRD ANBCTRL (SUTURE) ×2 IMPLANT
SUT VIC AB 2-0 CT1 (SUTURE) ×3 IMPLANT
SUT VIC AB 4-0 KS 27 (SUTURE) ×3 IMPLANT
SYR CONTROL 10ML LL (SYRINGE) ×3 IMPLANT
TOWEL OR 17X24 6PK STRL BLUE (TOWEL DISPOSABLE) ×3 IMPLANT
TRAY FOLEY W/BAG SLVR 14FR LF (SET/KITS/TRAYS/PACK) ×3 IMPLANT

## 2017-12-26 NOTE — Anesthesia Preprocedure Evaluation (Addendum)
Anesthesia Evaluation  Patient identified by MRN, date of birth, ID band Patient awake    Reviewed: Allergy & Precautions, NPO status   Airway Mallampati: II  TM Distance: >3 FB Neck ROM: Full    Dental no notable dental hx. (+) Teeth Intact   Pulmonary asthma ,    Pulmonary exam normal breath sounds clear to auscultation       Cardiovascular Exercise Tolerance: Good hypertension, Normal cardiovascular exam Rhythm:Regular Rate:Normal     Neuro/Psych Depression negative neurological ROS     GI/Hepatic negative GI ROS, Neg liver ROS,   Endo/Other  diabetes  Renal/GU Renal diseaseNephrotic syndrome     Musculoskeletal negative musculoskeletal ROS (+)   Abdominal   Peds  Hematology negative hematology ROS (+)   Anesthesia Other Findings   Reproductive/Obstetrics (+) Pregnancy                             Lab Results  Component Value Date   WBC 7.1 12/26/2017   HGB 9.8 (L) 12/26/2017   HCT 29.5 (L) 12/26/2017   MCV 84.5 12/26/2017   PLT 392 12/26/2017   Lab Results  Component Value Date   CREATININE 0.62 12/26/2017   BUN 23 (H) 12/26/2017   NA 135 12/26/2017   K 4.1 12/26/2017   CL 104 12/26/2017   CO2 22 12/26/2017    Anesthesia Physical Anesthesia Plan  ASA: III  Anesthesia Plan: Spinal   Post-op Pain Management:    Induction:   PONV Risk Score and Plan:   Airway Management Planned: Natural Airway and Nasal Cannula  Additional Equipment:   Intra-op Plan:   Post-operative Plan:   Informed Consent: I have reviewed the patients History and Physical, chart, labs and discussed the procedure including the risks, benefits and alternatives for the proposed anesthesia with the patient or authorized representative who has indicated his/her understanding and acceptance.   Dental advisory given  Plan Discussed with:   Anesthesia Plan Comments: (C/s for NRFHT pt w HTN,  DM (unciontrolled) nephrotic syndrome .   Will check BS give 1/8 amp d50 use albumin plus crystalloid)       Anesthesia Quick Evaluation

## 2017-12-26 NOTE — Progress Notes (Signed)
1805 CBG in PACU 69.  Dr. Royce Macadamia notified and orders received.  Will recheck CBG in one hour in PACU.

## 2017-12-26 NOTE — Progress Notes (Signed)
Faculty Practice OB/GYN Attending Note  Called to evaluate patient with recurrent deep fetal heart decelerations. She is s/p first dose of cytotec 2 hours ago. On my arrival, patient is lying on her right side, oxygen mask in place. FHR noted to be in the 90s lasting for 1-2 minutes and going back up to baseline of 120s with decreased variability. Frequent contractions palpated.     Blood pressure 120/70, pulse 90, temperature 97.9 F (36.6 C), temperature source Oral, resp. rate 18, height 5\' 6"  (1.676 m), weight 93.5 kg, last menstrual period 04/02/2017, SpO2 100 %. Gen: NAD HENT: Normocephalic, atraumatic Lungs: Normal respiratory effort Heart: Regular rate noted Abdomen: NT,gravid fundus, soft Cervix: Dilation: Fingertip Effacement (%): 60 Cervical Position: Posterior Station: -3 Presentation: Vertex Exam by:: Philis Pique, RN  Concerned about repetitive FHR decelerations, Terbutaline administered but decelerations continued.  Patient counseled about fetal intolerance of labor, need for cesarean delivery.  The risks of cesarean section discussed with the patient included but were not limited to: bleeding which may require transfusion or reoperation; infection which may require antibiotics; injury to bowel, bladder, ureters or other surrounding organs; injury to the fetus; need for additional procedures including hysterectomy in the event of a life-threatening hemorrhage; placental abnormalities wth subsequent pregnancies, incisional problems, thromboembolic phenomenon and other postoperative/anesthesia complications. Patient was concerned about the stress of surgery being associated with increased risk of DKA, she was told she will be monitored closely. Also discussed need to avoid fluid overloading this patient with Dr. Valma Cava (Anesthesiologist) given her nephrotic syndrome and already present severe peripheral edema. The patient concurred with the proposed plan, giving informed written consent  for the procedure.   Anesthesia and OR aware. Preoperative prophylactic antibiotics and SCDs ordered on call to the OR.  To OR when ready.  Marland Kitchen    Verita Schneiders, MD, [place] for [employer], [place]

## 2017-12-26 NOTE — Anesthesia Pain Management Evaluation Note (Signed)
  CRNA Pain Management Visit Note  Patient: Barbara Donovan, 27 y.o., female  "Hello I am a member of the anesthesia team at [facility]. We have an anesthesia team available at all times to provide care throughout the hospital, including epidural management and anesthesia for C-section. I don't know your plan for the delivery whether it a natural birth, water birth, IV sedation, nitrous supplementation, doula or epidural, but we want to meet your pain goals."   1.Was your pain managed to your expectations on prior hospitalizations?   No prior hospitalizations  2.What is your expectation for pain management during this hospitalization?     Epidural  3.How can we help you reach that goal? epidural  Record the patient's initial score and the patient's pain goal.   Pain: 0  Pain Goal: 5 [facility] wants you to be able to say your pain was always managed very well.  Trooper Olander 12/26/2017

## 2017-12-26 NOTE — Progress Notes (Signed)
Faculty Practice OB/GYN Attending Note  27 y.o. G1P0 at [redacted]w[redacted]d with multiple clinical issues as below, now with persistent reverse end diastolic flow of her umbilical artery (see report below). Patient Active Problem List   Diagnosis Date Noted  . Diabetes mellitus complicating pregnancy, antepartum 12/22/2017  . Intrauterine growth restriction (IUGR) affecting care of mother 12/22/2017  . Proteinuria due to type 1 diabetes mellitus ([place]) 11/02/2017  . Pregnancy complicated by multiple fetal congenital anomalies 10/30/2017  . Severe hyperemesis gravidarum 10/30/2017  . Ventricular septal defect (VSD) of fetus in singleton pregnancy, antepartum 09/30/2017  . Chronic hypertension 08/21/2017  . Depression during pregnancy, antepartum 07/07/2017  . Supervision of high risk pregnancy, antepartum 07/07/2017  . Type 1 diabetes mellitus affecting pregnancy, antepartum 07/07/2017  . Chronic hypertension affecting pregnancy 07/07/2017  . Diabetic retinopathy ([place]) 06/09/2017  . Acute depression 11/02/2014  . Asthma 10/30/2013   [name] Mfm Fetal Bpp Wo Non Stress  Result Date: 12/26/2017 ----------------------------------------------------------------------  OBSTETRICS REPORT                       (Signed Final 12/26/2017 08:25 am) ---------------------------------------------------------------------- Patient Info  ID #:       [id number]                          D.O.B.:  Jan 06, 1991 (27 yrs)  Name:       Barbara Donovan                  Visit Date: 12/26/2017 07:10 am ---------------------------------------------------------------------- Performed By  Performed By:     Novella Rob        Secondary Phy.:   [place] for                                                             [place]  Attending:        Pricilla Handler MD       Address:           [facility]  [place], [place]  Referred By:      [facility]       Location:         [facility] for                    [place]  Ref. Address:     [facility]                    [place]                    [place], [place] ---------------------------------------------------------------------- Orders   #  Description                          Code         Ordered By   1  [name] MFM FETAL BPP WO NON              44315.40     Mallory Shirk      STRESS   2  [name] MFM UA CORD DOPPLER               76820.02     Mallory Shirk  ----------------------------------------------------------------------   #  Order #                    Accession #                 Episode #   1  [id number]                  [id number]                  [id number]   2  [id number]  [id number]                  [id number]  ---------------------------------------------------------------------- Indications   Pre-existing essential hypertension            P54.656   complicating pregnancy, third trimester   Pre-existing diabetes, type 1, in pregnancy,   O24.013   third trimester   [redacted] weeks gestation of pregnancy                Z3A.35  ---------------------------------------------------------------------- Fetal Evaluation  Num Of Fetuses:         1  Fetal Heart Rate(bpm):  138  Cardiac Activity:       Observed  Presentation:           Cephalic  Amniotic Fluid  AFI FV:      Oligohydramnios  AFI Sum(cm)     %Tile       Largest Pocket(cm)  1.14            < 3          1.14  RUQ(cm)       RLQ(cm)       LUQ(cm)        LLQ(cm)  0             1.14          0              0  Comment:    maternal ascites visualized in all four quadrant ---------------------------------------------------------------------- Biophysical Evaluation  Amniotic F.V:   Oligohydramnios            F. Tone:        Observed  F. Movement:    Observed                   Score:          6/8  F. Breathing:   Observed ---------------------------------------------------------------------- OB History  Gravidity:    1         Term:   0        Prem:   0        SAB:   0  TOP:          0       Ectopic:  0        Living: 0 ---------------------------------------------------------------------- Gestational Age  Best:          35w 5d     Det. By:  Loman Chroman         EDD:   01/25/18                                      (05/30/17) ---------------------------------------------------------------------- Anatomy  Thoracic:              Appears normal         Kidneys:                Absent right kidney  Stomach:               Appears normal, left   Bladder:                Appears normal                         sided ---------------------------------------------------------------------- Doppler - Fetal Vessels  Umbilical Artery  ADFV    RDFV                                                              Yes     Yes  Ductus Venosus                                              Reversed ---------------------------------------------------------------------- Cervix Uterus Adnexa  Cervix  Not visualized (advanced GA >24wks) ---------------------------------------------------------------------- Impression  Live late preterm fetus with oligohydramnios and abnormal  umbilical artery and ductus venosus velocimetry. ---------------------------------------------------------------------- Recommendations  Continuous monitoring  Delivery  ----------------------------------------------------------------------                 Pricilla Handler, MD Electronically Signed Final Report   12/26/2017 08:25 am ----------------------------------------------------------------------  [name] Mfm Ua Cord Doppler  Result Date: 12/26/2017 ----------------------------------------------------------------------  OBSTETRICS REPORT                       (Signed Final 12/26/2017 08:25 am) ---------------------------------------------------------------------- Patient Info  ID #:       [id number]                          D.O.B.:  [birth year] (27 yrs)  Name:       Barbara Donovan                  Visit Date: 12/26/2017 07:10 am ---------------------------------------------------------------------- Performed By  Performed By:     Novella Rob        Secondary Phy.:   [place] for                                                             [facility]'s                                                             Healthcare  Attending:        Pricilla Handler MD       Address:          [facility]  OB/Gyn Clinic                                                             [place], [place]  Referred By:      [facility]       Location:         [facility] for                    [place]  Ref. Address:     [facility]                    [place]                    [place], [place] ---------------------------------------------------------------------- Orders   #  Description                           Code         Ordered By   1  [name] MFM FETAL BPP WO NON              67893.81     Mallory Shirk      STRESS   2  [name] MFM UA CORD DOPPLER               01751.02     Mallory Shirk  ----------------------------------------------------------------------   #  Order #                    Accession #  Episode #   1  [id number]                  [id number]                  [id number]   2  [id number]                  [id number]                  [id number]  ---------------------------------------------------------------------- Indications   Pre-existing essential hypertension            H88.502   complicating pregnancy, third trimester   Pre-existing diabetes, type 1, in pregnancy,   O24.013   third trimester   [redacted] weeks gestation of pregnancy                Z3A.35  ---------------------------------------------------------------------- Fetal Evaluation  Num Of Fetuses:         1  Fetal Heart Rate(bpm):  138  Cardiac Activity:       Observed  Presentation:           Cephalic  Amniotic Fluid  AFI FV:      Oligohydramnios  AFI Sum(cm)     %Tile       Largest Pocket(cm)  1.14            < 3         1.14  RUQ(cm)       RLQ(cm)       LUQ(cm)        LLQ(cm)  0             1.14          0              0  Comment:    maternal ascites visualized in all four quadrant ---------------------------------------------------------------------- Biophysical Evaluation  Amniotic F.V:   Oligohydramnios            F. Tone:        Observed  F. Movement:    Observed                   Score:          6/8  F. Breathing:   Observed ---------------------------------------------------------------------- OB History  Gravidity:    1         Term:   0        Prem:   0        SAB:   0  TOP:          0       Ectopic:  0        Living: 0 ---------------------------------------------------------------------- Gestational Age  Best:          35w 5d     Det. ByLoman Chroman         EDD:   01/25/18                                       (05/30/17) ---------------------------------------------------------------------- Anatomy  Thoracic:              Appears normal         Kidneys:                Absent right kidney  Stomach:  Appears normal, left   Bladder:                Appears normal                         sided ---------------------------------------------------------------------- Doppler - Fetal Vessels  Umbilical Artery                                                            ADFV    RDFV                                                              Yes     Yes  Ductus Venosus                                              Reversed ---------------------------------------------------------------------- Cervix Uterus Adnexa  Cervix  Not visualized (advanced GA >24wks) ---------------------------------------------------------------------- Impression  Live late preterm fetus with oligohydramnios and abnormal  umbilical artery and ductus venosus velocimetry. ---------------------------------------------------------------------- Recommendations  Continuous monitoring  Delivery ----------------------------------------------------------------------                 Pricilla Handler, MD Electronically Signed Final Report   12/26/2017 08:25 am ----------------------------------------------------------------------  Will send to L&D for IOL. Patient counseled about high risk of needing cesarean delivery given IUGR and REDF; will get CST on L&D prior to IOL.  Transfer orders placed for patient. L&D RN in charge and team notified.    Verita Schneiders, MD, [place] for [employer], [place]

## 2017-12-26 NOTE — Progress Notes (Signed)
LABOR PROGRESS NOTE  Barbara Donovan is a 27 y.o. G1P0 at [redacted]w[redacted]d transferred from antepartum unit for IOL in the setting of reverse end diastolic flow of the umbilical artery in the setting of uncontrolled T1DM and multiple resulting fetal anomalies. Anomalies include unilateral kidney, IUGR, membraneous VSD, bilateral absence of the radia.  Subjective: Resting comfortably in bed. Anxious for safety of baby but comfortable with plan.   Objective: BP (!) 143/93   Pulse 83   Temp 98.1 F (36.7 C) (Oral)   Resp 18   Ht 5\' 6"  (1.676 m)   Wt 93.5 kg   LMP 04/02/2017 (Approximate)   SpO2 100%   BMI 33.25 kg/m  or  Vitals:   12/26/17 0616 12/26/17 0755 12/26/17 1002 12/26/17 1042  BP: 134/90 (!) 149/88 (!) 143/93   Pulse: 96 82 83   Resp: 18 19 20 18   Temp:  98.1 F (36.7 C) 98.1 F (36.7 C)   TempSrc:  Oral Oral   SpO2: 100% 100%    Weight:      Height:        No cervical exam pending cx stress test.    FHT: baseline rate 130s, moderate variability, +acel, -decel Toco: One contraction on monitor without deceleration.  Labs: Lab Results  Component Value Date   WBC 7.1 12/26/2017   HGB 9.8 (L) 12/26/2017   HCT 29.5 (L) 12/26/2017   MCV 84.5 12/26/2017   PLT 392 12/26/2017    Patient Active Problem List   Diagnosis Date Noted  . Diabetes mellitus complicating pregnancy, antepartum 12/22/2017  . Intrauterine growth restriction (IUGR) affecting care of mother 12/22/2017  . 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 Pregnancy complicated by multiple fetal congenital anomalies 10/30/2017  . Severe hyperemesis gravidarum 10/30/2017  . Ventricular septal defect (VSD) of fetus in singleton pregnancy, antepartum 09/30/2017  . Chronic hypertension 08/21/2017  . Depression during pregnancy, antepartum 07/07/2017  . Supervision of high risk pregnancy, antepartum 07/07/2017  . Type 1 diabetes mellitus affecting pregnancy, antepartum 07/07/2017  . Chronic  hypertension affecting pregnancy 07/07/2017  . Diabetic retinopathy ([place]) 06/09/2017  . Acute depression 11/02/2014  . Asthma 10/30/2013    Assessment / Plan: Barbara Donovan is a 27 y.o. G1P0 at [redacted]w[redacted]d transferred from antepartum unit for IOL in the setting of reverse end diastolic flow of the umbilical artery in the setting of uncontrolled T1DM and multiple resulting fetal anomalies.   Labor: Not currently in labor. Misoprostol and foley if fetal tolerance. Fetal Wellbeing:  Pending CST, currently reactive and reassuring. Pain Control:  None required at this time. Anesthesia aware, recommend early epidural at high risk of fetal intolerance to labor. Anticipated MOD:  Vaginal vs CS pending CST.  Oswaldo Milian, MD PGY-1 Family Medicine Resident 12/26/2017, 11:01 AM

## 2017-12-26 NOTE — Anesthesia Procedure Notes (Signed)
Spinal  Patient location during procedure: OB Start time: 12/26/2017 4:18 PM End time: 12/26/2017 4:24 PM Staffing Anesthesiologist: Barnet Glasgow, MD Performed: anesthesiologist  Preanesthetic Checklist Completed: patient identified, surgical consent, pre-op evaluation, timeout performed, IV checked, risks and benefits discussed and monitors and equipment checked Spinal Block Patient position: sitting Prep: site prepped and draped and DuraPrep Patient monitoring: heart rate, cardiac monitor, continuous pulse ox and blood pressure Approach: midline Location: L3-4 Injection technique: single-shot Needle Needle type: Pencan  Needle gauge: 24 G Needle length: 10 cm Needle insertion depth: 7 cm Assessment Sensory level: T4 Additional Notes 2 Attempt (s). Pt tolerated procedure well.

## 2017-12-26 NOTE — Anesthesia Postprocedure Evaluation (Signed)
Anesthesia Post Note  Patient: Barbara Donovan  Procedure(s) Performed: CESAREAN SECTION (N/A )     Patient location during evaluation: PACU Anesthesia Type: Spinal Level of consciousness: oriented and awake and alert Pain management: pain level controlled Vital Signs Assessment: post-procedure vital signs reviewed and stable Respiratory status: spontaneous breathing, respiratory function stable and nonlabored ventilation Cardiovascular status: blood pressure returned to baseline and stable Postop Assessment: no headache, no backache, no apparent nausea or vomiting, patient able to bend at knees and spinal receding Anesthetic complications: no    Last Vitals:  Vitals:   12/26/17 1845 12/26/17 1908  BP: 127/82 (!) 150/84  Pulse: 72 74  Resp: 14 13  Temp:  36.5 C  SpO2: 99% 100%    Last Pain:  Vitals:   12/26/17 1908  TempSrc: Oral  PainSc: 0-No pain   Pain Goal: Patients Stated Pain Goal: 3 (12/26/17 0529)  LLE Motor Response: Purposeful movement (12/26/17 1908) LLE Sensation: Tingling (12/26/17 1908) RLE Motor Response: Purposeful movement (12/26/17 1908) RLE Sensation: Tingling (12/26/17 1908)      Zabian Swayne A.

## 2017-12-26 NOTE — Progress Notes (Signed)
1915 CBG in PACU 84. Dr. Royce Macadamia notified and no new orders received.

## 2017-12-26 NOTE — Transfer of Care (Signed)
Immediate Anesthesia Transfer of Care Note  Patient: Barbara Donovan  Procedure(s) Performed: CESAREAN SECTION (N/A )  Patient Location: PACU  Anesthesia Type:Spinal  Level of Consciousness: awake, alert  and oriented  Airway & Oxygen Therapy: Patient Spontanous Breathing  Post-op Assessment: Report given to RN and Post -op Vital signs reviewed and stable  Post vital signs: Reviewed and stable  Last Vitals:  Vitals Value Taken Time  BP 114/78 12/26/2017  5:39 PM  Temp    Pulse 75 12/26/2017  5:43 PM  Resp 21 12/26/2017  5:43 PM  SpO2 97 % 12/26/2017  5:43 PM  Vitals shown include unvalidated device data.  Last Pain:  Vitals:   12/26/17 1614  TempSrc:   PainSc: 0-No pain      Patients Stated Pain Goal: 3 (03/01/15 3567)  Complications: No apparent anesthesia complications

## 2017-12-26 NOTE — Consult Note (Signed)
[facility]  Prenatal Consult       12/26/2017  11:05 AM  I was asked by Dr. Harolyn Rutherford to consult on this patient for preterm delivery and growth restriction.  I had the pleasure of meeting with Barbara Donovan today.  She is a 27 y/o G1P0 at 48 and 5/[redacted] weeks gestation who is now being induced for IUGR and reversal of end diastolic flow.  Her pregnancy has been complicated by type 1 diabetes and chronic hypertension.  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    I explained that the neonatal intensive care team would be present for the delivery and outlined the likely delivery room course for this baby including routine resuscitation and NRP-guided approaches to the treatment of respiratory distress. We discussed other common problems associated with prematurity including respiratory distress syndrome/CLD, apnea, feeding issues, temperature regulation, and infection risk.  We discussed further diagnostic evaluation of the fetal anomalies that were seen on prenatal ultrasound, including renal ultrasound, head ultrasound, echocardiogram.  We discussed the average length of stay but I noted that the actual LOS would depend on the severity of problems encountered and response to treatments.  We discussed visitation policies and the resources available while her child is in the hospital.  We discussed the importance of good nutrition and various methods of providing nutrition (parenteral hyperalimentation, gavage feedings and/or oral feeding). We discussed the benefits of human milk. I encouraged breast feeding and pumping soon after birth and outlined resources that are available to support breast feeding.   Thank you for involving [name] in the care of this patient. A member of our team will be  available should the family have additional questions.  Time for consultation approximately 45 minutes.   _____________________ Electronically Signed By: Clinton Gallant, MD Neonatologist

## 2017-12-26 NOTE — Progress Notes (Signed)
Patient ID: Barbara Donovan, female   DOB: [birth year], 27 y.o.   MRN: [id number] [place]) NOTE  Barbara Donovan is a 27 y.o. G1P0 with Estimated Date of Delivery: 01/25/18   By   [redacted]w[redacted]d  who is admitted for SOB + poorly controlled DM.    Fetal presentation is cephalic. Length of Stay:  4  Days  Date of admission:12/22/2017  Subjective:  Patient reports the fetal movement as active. Patient reports uterine contraction  activity as none. Patient reports  vaginal bleeding as none. Patient describes fluid per vagina as None.  Vitals:  Blood pressure 134/90, pulse 96, temperature 98.2 F (36.8 C), temperature source Oral, resp. rate 18, height 5\' 6"  (1.676 m), weight 93.5 kg, last menstrual period 04/02/2017, SpO2 100 %. Vitals:   12/25/17 1933 12/25/17 2250 12/26/17 0256 12/26/17 0616  BP: (!) 153/96 (!) 149/82 135/79 134/90  Pulse: 95 96 94 96  Resp: 17 17 18 18   Temp: 98.5 F (36.9 C) 98.2 F (36.8 C)    TempSrc: Oral Oral    SpO2: 100% 99% 100% 100%  Weight:      Height:       Physical Examination:  General appearance - alert, well appearing, and in no distress Abdomen - soft, nontender, nondistended, no masses or organomegaly Fundal Height:  size equals dates Pelvic Exam:  examination not indicated Cervical Exam: Not evaluated. Extremities: 3+ edema with DTRs 2+ bilaterally Membranes:intact  Fetal Monitoring:  FHR tracing has been reactive stable   Most recently 0330 this am  Labs:  Results for orders placed or performed during the hospital encounter of 12/22/17 (from the past 24 hour(s))  Glucose, capillary   Collection Time: 12/25/17  9:27 AM  Result Value Ref Range   Glucose-Capillary 90 70 - 99 mg/dL  Glucose, capillary   Collection Time: 12/25/17 12:48 PM  Result Value Ref Range   Glucose-Capillary 78 70 - 99 mg/dL  Glucose, capillary   Collection Time: 12/25/17  3:06 PM  Result Value Ref Range   Glucose-Capillary 114 (H) 70 - 99  mg/dL  Type and screen [place]   Collection Time: 12/25/17  5:52 PM  Result Value Ref Range   ABO/RH(D) A POS    Antibody Screen NEG    Sample Expiration      12/28/2017 Performed at [facility], [address], [place], [place] [address]   Glucose, capillary   Collection Time: 12/25/17  8:52 PM  Result Value Ref Range   Glucose-Capillary 118 (H) 70 - 99 mg/dL  Glucose, capillary   Collection Time: 12/25/17 10:51 PM  Result Value Ref Range   Glucose-Capillary 134 (H) 70 - 99 mg/dL  Basic metabolic panel   Collection Time: 12/26/17  5:13 AM  Result Value Ref Range   Sodium 133 (L) 135 - 145 mmol/L   Potassium 3.9 3.5 - 5.1 mmol/L   Chloride 102 98 - 111 mmol/L   CO2 23 22 - 32 mmol/L   Glucose, Bld 154 (H) 70 - 99 mg/dL   BUN 25 (H) 6 - 20 mg/dL   Creatinine, Ser 0.73 0.44 - 1.00 mg/dL   Calcium 8.5 (L) 8.9 - 10.3 mg/dL   GFR calc non Af Amer >60 >60 mL/min   GFR calc Af Amer >60 >60 mL/min   Anion gap 8 5 - 15    Imaging Studies:    Pending from today  Medications:  Scheduled . aspirin  81 mg Oral Daily  .  docusate sodium  100 mg Oral Daily  . doxylamine (Sleep)  25 mg Oral Daily  . insulin aspart  7 Units Subcutaneous TID WC  . insulin glargine  25 Units Subcutaneous QHS  . labetalol  600 mg Oral Q8H  . neomycin-bacitracin-polymyxin   Topical BID  . NIFEdipine  60 mg Oral BID  . potassium chloride  20 mEq Oral BID  . prenatal multivitamin  1 tablet Oral Q1200  . pyridOXINE  50 mg Oral Daily   I have reviewed the patient's current medications.  ASSESSMENT: G1P0 [redacted]w[redacted]d Estimated Date of Delivery: 01/25/18  Poorly controlled diabetes FGR with elevated UAD, >97.5% Chronic Proteinuria CHTN  Patient Active Problem List   Diagnosis Date Noted  . Diabetes mellitus complicating pregnancy, antepartum 12/22/2017  . Intrauterine growth restriction (IUGR) affecting care of mother 12/22/2017  . Proteinuria due to type 1 diabetes mellitus  ([place]) 11/02/2017  . Pregnancy complicated by multiple fetal congenital anomalies 10/30/2017  . Severe hyperemesis gravidarum 10/30/2017  . Ventricular septal defect (VSD) of fetus in singleton pregnancy, antepartum 09/30/2017  . Chronic hypertension 08/21/2017  . Depression during pregnancy, antepartum 07/07/2017  . Supervision of high risk pregnancy, antepartum 07/07/2017  . Type 1 diabetes mellitus affecting pregnancy, antepartum 07/07/2017  . Chronic hypertension affecting pregnancy 07/07/2017  . Diabetic retinopathy ([place]) 06/09/2017  . Acute depression 11/02/2014  . Asthma 10/30/2013    PLAN: Plan IOL 36 weeks or as clinically indicated, sonogram pending from today Cont blood sugar management Some confusion over MAAC/neonatology status of consult  Luther H Eure 12/26/2017,7:54 AM

## 2017-12-27 ENCOUNTER — Encounter (HOSPITAL_COMMUNITY): Payer: Self-pay | Admitting: Obstetrics & Gynecology

## 2017-12-27 DIAGNOSIS — Z3A35 35 weeks gestation of pregnancy: Secondary | ICD-10-CM

## 2017-12-27 DIAGNOSIS — O365930 Maternal care for other known or suspected poor fetal growth, third trimester, not applicable or unspecified: Secondary | ICD-10-CM

## 2017-12-27 DIAGNOSIS — O2412 Pre-existing diabetes mellitus, type 2, in childbirth: Secondary | ICD-10-CM

## 2017-12-27 LAB — GLUCOSE, CAPILLARY
GLUCOSE-CAPILLARY: 95 mg/dL (ref 70–99)
GLUCOSE-CAPILLARY: 84 mg/dL (ref 70–99)
Glucose-Capillary: 69 mg/dL — ABNORMAL LOW (ref 70–99)
Glucose-Capillary: 80 mg/dL (ref 70–99)
Glucose-Capillary: 79 mg/dL (ref 70–99)
Glucose-Capillary: 106 mg/dL — ABNORMAL HIGH (ref 70–99)

## 2017-12-27 LAB — BASIC METABOLIC PANEL
ANION GAP: 9 (ref 5–15)
BUN: 19 mg/dL (ref 6–20)
CO2: 22 mmol/L (ref 22–32)
Calcium: 8 mg/dL — ABNORMAL LOW (ref 8.9–10.3)
Chloride: 100 mmol/L (ref 98–111)
Creatinine, Ser: 0.76 mg/dL (ref 0.44–1.00)
GFR calc Af Amer: >60 mL/min (ref >60)
GFR calc non Af Amer: >60 mL/min (ref >60)
Glucose, Bld: 111 mg/dL — ABNORMAL HIGH (ref 70–99)
POTASSIUM: 4.1 mmol/L (ref 3.5–5.1)
Sodium: 131 mmol/L — ABNORMAL LOW (ref 135–145)

## 2017-12-27 LAB — CBC
HCT: 28.7 % — ABNORMAL LOW (ref 36.0–46.0)
Hemoglobin: 9.6 g/dL — ABNORMAL LOW (ref 12.0–15.0)
MCH: 28.2 pg (ref 26.0–34.0)
MCHC: 33.4 g/dL (ref 30.0–36.0)
MCV: 84.2 fL (ref 80.0–100.0)
NRBC: 0 % (ref 0.0–0.2)
Platelets: 382 10*3/uL (ref 150–400)
RBC: 3.41 MIL/uL — ABNORMAL LOW (ref 3.87–5.11)
RDW: 13.2 % (ref 11.5–15.5)
WBC: 10.2 10*3/uL (ref 4.0–10.5)

## 2017-12-27 LAB — RPR: RPR Ser Ql: NONREACTIVE

## 2017-12-27 LAB — MAGNESIUM: Magnesium: 1.6 mg/dL — ABNORMAL LOW (ref 1.7–2.4)

## 2017-12-27 MED ORDER — IBUPROFEN 600 MG PO TABS
600.0000 mg | ORAL_TABLET | Freq: Four times a day (QID) | ORAL | Status: DC | PRN
  Administered 2017-12-27 – 2017-12-28 (×4): 600 mg via ORAL
  Filled 2017-12-27 (×4): qty 1

## 2017-12-27 MED ORDER — OXYCODONE-ACETAMINOPHEN 5-325 MG PO TABS
1.0000 | ORAL_TABLET | ORAL | Status: DC | PRN
  Administered 2017-12-28 (×2): 1 via ORAL
  Administered 2017-12-28: 2 via ORAL
  Administered 2017-12-28: 1 via ORAL
  Administered 2017-12-29: 2 via ORAL
  Filled 2017-12-27: qty 1
  Filled 2017-12-27: qty 2
  Filled 2017-12-27 (×2): qty 1
  Filled 2017-12-27: qty 2

## 2017-12-27 NOTE — Op Note (Signed)
[name] PROCEDURE DATE: 12/27/2017  PREOPERATIVE DIAGNOSES: Intrauterine pregnancy at 104w6d weeks gestation; non-reassuring fetal status  POSTOPERATIVE DIAGNOSES: The same  PROCEDURE: Primary Low Transverse Cesarean Section  SURGEON:  Woodroe Mode, MD  ASSISTANT:  Dr. Verita Schneiders, Gerri Lins DO  ANESTHESIOLOGY TEAM: Anesthesiologist: Barnet Glasgow, MD CRNA: Hewitt Blade, CRNA  INDICATIONS: Barbara Donovan is a 27 y.o. G1P0 at [redacted]w[redacted]d here for cesarean section secondary to the indications listed under preoperative diagnoses; please see preoperative note for further details.  The risks of cesarean section were discussed with the patient including but were not limited to: bleeding which may require transfusion or reoperation; infection which may require antibiotics; injury to bowel, bladder, ureters or other surrounding organs; injury to the fetus; need for additional procedures including hysterectomy in the event of a life-threatening hemorrhage; placental abnormalities wth subsequent pregnancies, incisional problems, thromboembolic phenomenon and other postoperative/anesthesia complications.   The patient concurred with the proposed plan, giving informed written consent for the procedure.    FINDINGS:  Viable female infant in cephalic presentation.  Apgars 8 and 8.  Light meconium amniotic fluid.  Intact placenta, three vessel cord.  Normal uterus, fallopian tubes and ovaries bilaterally.  ANESTHESIA: Spinal  INTRAVENOUS FLUIDS: 1000 ml   ESTIMATED BLOOD LOSS: 300 ml URINE OUTPUT:  100 ml SPECIMENS: Placenta sent to pathology COMPLICATIONS: None immediate  PROCEDURE IN DETAIL:  The patient preoperatively received intravenous antibiotics and had sequential compression devices applied to her lower extremities.  She was then taken to the operating room where spinal anesthesia was administered and was found to be adequate. She was then placed in a dorsal supine position  with a leftward tilt, and prepped and draped in a sterile manner.  A foley catheter was placed into her bladder and attached to constant gravity.  After an adequate timeout was performed, a Pfannenstiel skin incision was made with scalpel and carried through to the underlying layer of fascia. The fascia was incised in the midline, and this incision was extended bilaterally using the Mayo scissors.  Kocher clamps were applied to the superior aspect of the fascial incision and the underlying rectus muscles were dissected off bluntly and sharply.  A similar process was carried out on the inferior aspect of the fascial incision. The rectus muscles were separated in the midline and the peritoneum was entered bluntly. The Alexis self-retaining retractor was introduced into the abdominal cavity.  Attention was turned to the lower uterine segment where a low transverse hysterotomy was made with a scalpel and extended bilaterally bluntly.  The infant was successfully delivered, the cord was clamped and cut after one minute, and the infant was handed over to the awaiting neonatology team. Uterine massage was then administered, and the placenta delivered intact with a three-vessel cord. The uterus was then cleared of clots and debris.  The hysterotomy was closed with 0 Vicryl in a running locked fashion, and an imbricating layer was also placed with 0 Vicryl. The pelvis was cleared of all clot and debris. Hemostasis was confirmed on all surfaces.  The retractor was removed.  The peritoneum was closed with a 0 Vicryl running stitch. The fascia was then closed using 0 Vicryl.  The subcutaneous layer was irrigated, and the skin was closed with a 4-0 Vicryl subcuticular stitch. The patient tolerated the procedure well. Sponge, instrument and needle counts were correct x 3.  She was taken to the recovery room in stable condition.    Woodroe Mode, MD  [place], Product/process development scientist for [employer], [place]

## 2017-12-27 NOTE — Lactation Note (Signed)
This note was copied from a baby's chart. Lactation Consultation Note  Patient Name: Barbara Donovan [id number] Date: 12/27/2017 Reason for consult: Initial assessment;NICU baby;Late-preterm 34-36.6wks Breastfeeding consultation services and support information given to mom.  Providing Breastmilk For Your Baby in NICU book also given and reviewed.  Mom states she has used the pump once yesterday.  Stressed importance of pumping and hand expressing every 3 hours.  Mom plans on calling insurance company for a breast pump to have for home use after discharge.  Mom denies questions or concerns.  Encouraged to call out for assist prn.  Maternal Data    Feeding    LATCH Score                   Interventions    Lactation Tools Discussed/Used Initiated by:: RN Date initiated:: 12/27/17   Consult Status Consult Status: Follow-up Date: 12/28/17 Follow-up type: In-patient    Ave Filter 12/27/2017, 11:39 AM

## 2017-12-27 NOTE — Anesthesia Postprocedure Evaluation (Signed)
Anesthesia Post Note  Patient: Barbara Donovan  Procedure(s) Performed: CESAREAN SECTION (N/A )     Patient location during evaluation: Other Anesthesia Type: Spinal Level of consciousness: awake and alert Pain management: pain level controlled Vital Signs Assessment: post-procedure vital signs reviewed and stable Respiratory status: spontaneous breathing Cardiovascular status: stable Postop Assessment: no headache, adequate PO intake, no backache, patient able to bend at knees, able to ambulate and epidural receding Anesthetic complications: no    Last Vitals:  Vitals:   12/27/17 0628 12/27/17 0800  BP: 119/74 124/77  Pulse: 73 79  Resp: 17 18  Temp: 36.4 C 36.5 C  SpO2: 99% 100%    Last Pain:  Vitals:   12/27/17 0805  TempSrc:   PainSc: 0-No pain   Pain Goal: Patients Stated Pain Goal: 3 (12/26/17 0529)               Barbara Donovan

## 2017-12-27 NOTE — Addendum Note (Signed)
Addendum  created 12/27/17 1733 by Barnet Glasgow, MD   Attestation recorded in [place], [place] filed

## 2017-12-27 NOTE — Progress Notes (Signed)
Subjective: Postpartum Day 1: Cesarean Delivery Patient reports incisional pain and tolerating PO.    Objective: Vital signs in last 24 hours: Temp:  [97.4 F (36.3 C)-98.1 F (36.7 C)] 97.6 F (36.4 C) (12/10 0628) Pulse Rate:  [72-90] 73 (12/10 0628) Resp:  [13-20] 17 (12/10 0628) BP: (106-167)/(70-101) 119/74 (12/10 0628) SpO2:  [97 %-100 %] 99 % (12/10 1884)  Physical Exam:  General: alert, cooperative and no distress Lochia: appropriate Uterine Fundus: firm Incision: no significant drainage DVT Evaluation: No evidence of DVT seen on physical exam. Calf/Ankle edema is present.  Recent Labs    12/26/17 1043 12/27/17 0625  HGB 9.8* 9.6*  HCT 29.5* 28.7*    Intake/Output Summary (Last 24 hours) at 12/27/2017 0731 Last data filed at 12/27/2017 0430 Gross per 24 hour  Intake 3170.45 ml  Output 2466 ml  Net 704.45 ml   CBG (last 3)  Recent Labs    12/26/17 0904 12/26/17 1603 12/26/17 2102  GLUCAP 142* 59* 97   BMP Latest Ref Rng & Units 12/27/2017 12/26/2017 12/26/2017  Glucose 70 - 99 mg/dL 111(H) 126(H) 154(H)  BUN 6 - 20 mg/dL 19 23(H) 25(H)  Creatinine 0.44 - 1.00 mg/dL 0.76 0.62 0.73  BUN/Creat Ratio 9 - 23 - - -  Sodium 135 - 145 mmol/L 131(L) 135 133(L)  Potassium 3.5 - 5.1 mmol/L 4.1 4.1 3.9  Chloride 98 - 111 mmol/L 100 104 102  CO2 22 - 32 mmol/L 22 22 23   Calcium 8.9 - 10.3 mg/dL 8.0(L) 8.6(L) 8.5(L)   CBC    Component Value Date/Time   WBC 10.2 12/27/2017 0625   RBC 3.41 (L) 12/27/2017 0625   HGB 9.6 (L) 12/27/2017 0625   HGB 9.7 (L) 12/22/2017 1459   HGB 13.0 07/07/2017   HCT 28.7 (L) 12/27/2017 0625   HCT 28.5 (L) 12/22/2017 1459   HCT 39 07/07/2017   PLT 382 12/27/2017 0625   PLT 427 12/22/2017 1459   PLT 374 07/07/2017   MCV 84.2 12/27/2017 0625   MCV 82 12/22/2017 1459   MCH 28.2 12/27/2017 0625   MCHC 33.4 12/27/2017 0625   RDW 13.2 12/27/2017 0625   RDW 13.0 12/22/2017 1459   LYMPHSABS 1.7 08/19/2017 1907   MONOABS 0.4  08/19/2017 1907   EOSABS 0.0 08/19/2017 1907   BASOSABS 0.0 08/19/2017 1907      Assessment/Plan: Status post Cesarean section. Doing well postoperatively.  Lasix 40 mg BID, follow BG.   Barbara Donovan 12/27/2017, 7:30 AM

## 2017-12-28 LAB — BASIC METABOLIC PANEL
Anion gap: 9 (ref 5–15)
BUN: 27 mg/dL — ABNORMAL HIGH (ref 6–20)
CO2: 20 mmol/L — ABNORMAL LOW (ref 22–32)
Calcium: 8.1 mg/dL — ABNORMAL LOW (ref 8.9–10.3)
Chloride: 107 mmol/L (ref 98–111)
Creatinine, Ser: 0.98 mg/dL (ref 0.44–1.00)
GFR calc Af Amer: >60 mL/min (ref >60)
GFR calc non Af Amer: >60 mL/min (ref >60)
Glucose, Bld: 98 mg/dL (ref 70–99)
Potassium: 4.2 mmol/L (ref 3.5–5.1)
Sodium: 136 mmol/L (ref 135–145)

## 2017-12-28 LAB — GLUCOSE, CAPILLARY
Glucose-Capillary: 65 mg/dL — ABNORMAL LOW (ref 70–99)
Glucose-Capillary: 68 mg/dL — ABNORMAL LOW (ref 70–99)
Glucose-Capillary: 80 mg/dL (ref 70–99)
Glucose-Capillary: 80 mg/dL (ref 70–99)
Glucose-Capillary: 94 mg/dL (ref 70–99)

## 2017-12-28 LAB — MAGNESIUM: Magnesium: 1.6 mg/dL — ABNORMAL LOW (ref 1.7–2.4)

## 2017-12-28 MED ORDER — INSULIN GLARGINE 100 UNIT/ML ~~LOC~~ SOLN: 12.0000 [IU] | Freq: Every day | SUBCUTANEOUS | Status: DC

## 2017-12-28 MED ORDER — FERROUS SULFATE 325 (65 FE) MG PO TABS
325.0000 mg | ORAL_TABLET | Freq: Two times a day (BID) | ORAL | Status: DC
  Administered 2017-12-28 – 2017-12-29 (×2): 325 mg via ORAL
  Filled 2017-12-28 (×2): qty 1

## 2017-12-28 MED ORDER — ENALAPRIL MALEATE 20 MG PO TABS
20.0000 mg | ORAL_TABLET | Freq: Two times a day (BID) | ORAL | Status: DC
  Administered 2017-12-28 – 2017-12-29 (×3): 20 mg via ORAL
  Filled 2017-12-28 (×3): qty 1

## 2017-12-28 MED ORDER — DOCUSATE SODIUM 100 MG PO CAPS
100.0000 mg | ORAL_CAPSULE | Freq: Two times a day (BID) | ORAL | Status: DC
  Administered 2017-12-28 – 2017-12-29 (×3): 100 mg via ORAL
  Filled 2017-12-28 (×3): qty 1

## 2017-12-28 NOTE — Progress Notes (Signed)
Hypoglycemic Event  CBG: 65  Treatment: administered 4 oz of juice  Symptoms: no symptons  Follow-up CBG: Time: CBG Result:65  Possible Reasons for Event: patient has been in NICU and has not eaten lunch  Comments/MD notified: MD not notified at this time    Roseanne Kaufman

## 2017-12-28 NOTE — Progress Notes (Signed)
Postpartum Day 2: Cesarean Delivery  Subjective: Patient reports incisional pain, tolerating PO and no problems voiding.  No flatus yet. Breast and bottle feeding. Baby is doing well in NICU!  Objective: Vital signs in last 24 hours: Temp:  [97.3 F (36.3 C)-98.4 F (36.9 C)] 98 F (36.7 C) (12/11 0758) Pulse Rate:  [77-85] 85 (12/11 0758) Resp:  [17-20] 18 (12/11 0758) BP: (99-144)/(63-86) 135/67 (12/11 0758) SpO2:  [98 %-100 %] 100 % (12/11 0758)  Physical Exam:  General: alert, cooperative and no distress Lochia: appropriate Uterine Fundus: firm Incision: Still with serosanguinous drainage, will change dressing DVT Evaluation: No evidence of DVT seen on physical exam. Calf/Ankle edema is present.  Recent Labs    12/26/17 1043 12/27/17 0625  HGB 9.8* 9.6*  HCT 29.5* 28.7*    Intake/Output Summary (Last 24 hours) at 12/28/2017 1050 Last data filed at 12/28/2017 0800 Gross per 24 hour  Intake 480 ml  Output 1600 ml  Net -1120 ml   CBG (last 3)  Recent Labs    12/27/17 1630 12/27/17 2119 12/28/17 0001  GLUCAP 79 106* 94   BMP Latest Ref Rng & Units 12/28/2017 12/27/2017 12/26/2017  Glucose 70 - 99 mg/dL 98 111(H) 126(H)  BUN 6 - 20 mg/dL 27(H) 19 23(H)  Creatinine 0.44 - 1.00 mg/dL 0.98 0.76 0.62  BUN/Creat Ratio 9 - 23 - - -  Sodium 135 - 145 mmol/L 136 131(L) 135  Potassium 3.5 - 5.1 mmol/L 4.2 4.1 4.1  Chloride 98 - 111 mmol/L 107 100 104  CO2 22 - 32 mmol/L 20(L) 22 22  Calcium 8.9 - 10.3 mg/dL 8.1(L) 8.0(L) 8.6(L)   CBC Latest Ref Rng & Units 12/27/2017 12/26/2017 12/22/2017  WBC 4.0 - 10.5 K/uL 10.2 7.1 7.6  Hemoglobin 12.0 - 15.0 g/dL 9.6(L) 9.8(L) 11.2(L)  Hematocrit 36.0 - 46.0 % 28.7(L) 29.5(L) 32.6(L)  Platelets 150 - 400 K/uL 382 392 451(H)     Assessment/Plan: Status post Cesarean section. Doing well postoperatively.  Stable CBGs, continue insulin regimen Change dressing to Prevena negative wound pressure incisional dressing.  Continue  Lasix 40 mg BID. Normal K. Changed Labetalol to Enalapril for BP control, will observe closely. Routine postpartum care.  Verita Schneiders, MD 12/28/2017, 10:50 AM

## 2017-12-28 NOTE — Progress Notes (Signed)
Inpatient Diabetes Program Recommendations  AACE/ADA: New Consensus Statement on Inpatient Glycemic Control (2015)  Target Ranges:  Prepandial:   less than 140 mg/dL      Peak postprandial:   less than 180 mg/dL (1-2 hours)      Critically ill patients:  140 - 180 mg/dL   Lab Results  Component Value Date   GLUCAP 94 12/28/2017   HGBA1C 6.6 (H) 10/29/2017    Review of Glycemic Control Results for JAMAL, HASKIN (MRN [id number]) as of 12/28/2017 09:04  Ref. Range 12/27/2017 11:13 12/27/2017 16:30 12/27/2017 21:19 12/28/2017 00:01  Glucose-Capillary Latest Ref Range: 70 - 99 mg/dL 80 79 106 (H) 94   Consider changing correction to postpartum: Novolog 0-9 units TID and HS under glycemic order set.   Thanks, Bronson Curb, MSN, RNC-OB Diabetes Coordinator [phone] (8a-5p)

## 2017-12-29 ENCOUNTER — Telehealth: Payer: Self-pay

## 2017-12-29 ENCOUNTER — Telehealth: Payer: Self-pay | Admitting: Obstetrics & Gynecology

## 2017-12-29 DIAGNOSIS — E1121 Type 2 diabetes mellitus with diabetic nephropathy: Secondary | ICD-10-CM | POA: Diagnosis present

## 2017-12-29 HISTORY — DX: Type 2 diabetes mellitus with diabetic nephropathy: E11.21

## 2017-12-29 LAB — GLUCOSE, CAPILLARY
GLUCOSE-CAPILLARY: 98 mg/dL (ref 70–99)
Glucose-Capillary: 73 mg/dL (ref 70–99)
Glucose-Capillary: 94 mg/dL (ref 70–99)

## 2017-12-29 LAB — BASIC METABOLIC PANEL
Anion gap: 8 (ref 5–15)
BUN: 24 mg/dL — ABNORMAL HIGH (ref 6–20)
CO2: 24 mmol/L (ref 22–32)
Calcium: 7.7 mg/dL — ABNORMAL LOW (ref 8.9–10.3)
Chloride: 106 mmol/L (ref 98–111)
Creatinine, Ser: 0.91 mg/dL (ref 0.44–1.00)
GFR calc Af Amer: >60 mL/min (ref >60)
GFR calc non Af Amer: >60 mL/min (ref >60)
Glucose, Bld: 102 mg/dL — ABNORMAL HIGH (ref 70–99)
POTASSIUM: 3.4 mmol/L — AB (ref 3.5–5.1)
Sodium: 138 mmol/L (ref 135–145)

## 2017-12-29 LAB — MAGNESIUM: Magnesium: 1.8 mg/dL (ref 1.7–2.4)

## 2017-12-29 MED ORDER — OXYCODONE-ACETAMINOPHEN 5-325 MG PO TABS: 1.0000 | ORAL_TABLET | ORAL | 0 refills | Status: DC | PRN

## 2017-12-29 MED ORDER — ENALAPRIL MALEATE 20 MG PO TABS: 20.0000 mg | ORAL_TABLET | Freq: Two times a day (BID) | ORAL | 3 refills | Status: DC

## 2017-12-29 MED ORDER — DOCUSATE SODIUM 100 MG PO CAPS: 100.0000 mg | ORAL_CAPSULE | Freq: Two times a day (BID) | ORAL | 2 refills | Status: DC | PRN

## 2017-12-29 MED ORDER — FUROSEMIDE 40 MG PO TABS: 40.0000 mg | ORAL_TABLET | Freq: Two times a day (BID) | ORAL | 0 refills | Status: DC

## 2017-12-29 MED ORDER — MEDROXYPROGESTERONE ACETATE 150 MG/ML IM SUSP
150.0000 mg | Freq: Once | INTRAMUSCULAR | Status: AC
  Administered 2017-12-29: 150 mg via INTRAMUSCULAR
  Filled 2017-12-29: qty 1

## 2017-12-29 MED ORDER — IBUPROFEN 600 MG PO TABS: 600.0000 mg | ORAL_TABLET | Freq: Four times a day (QID) | ORAL | 0 refills | Status: DC | PRN

## 2017-12-29 MED ORDER — INSULIN ASPART 100 UNIT/ML ~~LOC~~ SOLN: 5.0000 [IU] | Freq: Three times a day (TID) | SUBCUTANEOUS | 11 refills | Status: DC

## 2017-12-29 MED ORDER — POTASSIUM CHLORIDE CRYS ER 20 MEQ PO TBCR: 40.0000 meq | EXTENDED_RELEASE_TABLET | Freq: Every day | ORAL | 1 refills | Status: DC

## 2017-12-29 MED ORDER — FERROUS SULFATE 325 (65 FE) MG PO TABS: 325.0000 mg | ORAL_TABLET | Freq: Two times a day (BID) | ORAL | 3 refills | Status: DC

## 2017-12-29 MED ORDER — INSULIN GLARGINE 100 UNIT/ML ~~LOC~~ SOLN: 12.0000 [IU] | Freq: Every day | SUBCUTANEOUS | 11 refills | Status: DC

## 2017-12-29 NOTE — Lactation Note (Signed)
This note was copied from a baby's chart. Lactation Consultation Note; Follow up visit with mom. She has just pumped and obtained about 15 ml for her second pumping. Reports breasts are feeling heavier. Encouraged to pump q 3 hours. Has a friends Evenflo pump for home. Suggested calling insurance company about providing a pump for home. No questions at present. To call prn  Patient Name: Boy Lyllian Gause GGYIR'S Date: 12/29/2017 Reason for consult: Follow-up assessment   Maternal Data Formula Feeding for Exclusion: No Has patient been taught Hand Expression?: Yes Does the patient have breastfeeding experience prior to this delivery?: No  Feeding Feeding Type: Donor Breast Milk  LATCH Score                   Interventions    Lactation Tools Discussed/Used WIC Program: No   Consult Status Consult Status: PRN    Truddie Crumble 12/29/2017, 8:42 AM

## 2017-12-29 NOTE — Discharge Instructions (Signed)

## 2017-12-29 NOTE — Telephone Encounter (Signed)
-----   Message from Osborne Oman, MD sent at 12/29/2017 11:49 AM EST ----- Regarding: PP appts 1 week: Prevena removal, BP check and incision check 4 weeks: Postparum Check

## 2017-12-29 NOTE — Telephone Encounter (Signed)
Pt called stating that she was returning a call from our office.  Called pt and notified her of her appts and nurse visit scheduled for 01/05/18 @ 0930.  Pt stated understanding with no further questions.

## 2017-12-29 NOTE — Discharge Summary (Signed)
OB Discharge Summary     Patient Name: Barbara Donovan DOB: [birth year] MRN: [id number]  Date of admission: 12/22/2017 Delivering MD: Verita Schneiders A   Date of discharge: 12/29/2017  Admitting diagnosis: 62 WKS, SHORTNESS OF BREATH,  Intrauterine pregnancy: [redacted]w[redacted]d    Secondary diagnosis:  Active Problems:   Diabetic retinopathy ([place]   Depression during pregnancy, antepartum   Supervision of high risk pregnancy, antepartum   Type 1 diabetes mellitus affecting pregnancy, antepartum   Chronic hypertension affecting pregnancy   Pregnancy complicated by multiple fetal congenital anomalies   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 yo G1P0101 at 336w1das admitted to the hospital 12/22/2017 for admitted for uncontrolled diabetes; IUGR with elevated dopplers and chronic HTN.  Antepartum course notable for having significant edema requiring multiple Lasix courses.  Inpatient Diabetes Coordinator helped a lot with management of her DM.  At 3543w5der fetus was noted to develop reversed end diastolic flow, needing delivery. She was taken to L&D, initially had a negative CST but had prolonged variable decelerations during induction of labor, leading to cesarean delivery.  She delivered a Viable infant,12/26/2017  Membrane Rupture Time/Date: 4:39 PM ,12/26/2017 .  Details of operation can be found in separate operative note.  Patient had an uncomplicated postpartum  course.  Prevena dressing was used, BP and DM were stable.  Was given Lasix for her She is ambulating, tolerating a regular diet, passing flatus, and urinating well.  Patient is discharged home in stable condition on 12/29/17.   Depo Provera given prior to discharge.       Physical exam  Vitals:   12/28/17 1934 12/29/17 0004 12/29/17 0301 12/29/17 0742  BP: (!) 147/83 (!) 147/88 140/80 137/84  Pulse: 94 91 91 100  Resp: _0 Temp: 99.4 F (37.4 C) 98.5 F (36.9 C) 98.7 F (37.1 C) 98.7 F (37.1 C)  TempSrc: Oral Oral Oral Oral  SpO2: 100% 100% 99% 100%  Weight:      Height:       General: alert and no distress Lochia: appropriate Uterine Fundus: firm Incision: Dressing is clean, dry, and intact DVT Evaluation: No evidence of DVT seen on physical exam. Negative Homan's sign. No cords or calf tenderness. Labs: Lab Results  Component Value Date   WBC 10.2 12/27/2017   HGB 9.6 (L) 12/27/2017   HCT 28.7 (L) 12/27/2017   MCV 84.2 12/27/2017   PLT 382 12/27/2017   CMP Latest Ref Rng & Units 12/29/2017  Glucose 70 - 99  mg/dL 102(H)  BUN 6 - 20 mg/dL 24(H)  Creatinine 0.44 - 1.00 mg/dL 0.91  Sodium 135 - 145 mmol/L 138  Potassium 3.5 - 5.1 mmol/L 3.4(L)  Chloride 98 - 111 mmol/L 106  CO2 22 - 32 mmol/L 24  Calcium 8.9 - 10.3 mg/dL 7.7(L)  Total Protein 6.5 - 8.1 g/dL -  Total Bilirubin 0.3 - 1.2 mg/dL -  Alkaline Phos 38 - 126 U/L -  AST 15 - 41 U/L -  ALT 0 - 44 U/L -    Discharge instruction: per After Visit Summary and "Baby and Me Booklet".  After visit meds:  Allergies as of 12/29/2017   No Known Allergies     Medication List    STOP taking these medications   BASAGLAR KWIKPEN 100 UNIT/ML Sopn Replaced by:  insulin glargine 100 UNIT/ML injection   insulin aspart 100 UNIT/ML FlexPen Commonly known as:  NOVOLOG FLEXPEN Replaced by:  insulin aspart 100 UNIT/ML injection   labetalol 300 MG tablet Commonly known as:  NORMODYNE   NIFEdipine 60 MG 24  hr tablet Commonly known as:  PROCARDIA XL/NIFEDICAL XL     TAKE these medications   aspirin 81 MG chewable tablet Chew 81 mg by mouth daily.   B-6 PO Take 1 tablet by mouth daily.   Blood Pressure Cuff Misc 1 kit by Does not apply route daily.   docusate sodium 100 MG capsule Commonly known as:  COLACE Take 1 capsule (100 mg total) by mouth 2 (two) times daily as needed for mild constipation or moderate constipation.   enalapril 20 MG tablet Commonly known as:  VASOTEC Take 1 tablet (20 mg total) by mouth 2 (two) times daily.   famotidine 20 MG tablet Commonly known as:  PEPCID Take 20 mg by mouth daily as needed for heartburn.   ferrous sulfate 325 (65 FE) MG tablet Take 1 tablet (325 mg total) by mouth 2 (two) times daily with a meal.   furosemide 40 MG tablet Commonly known as:  LASIX Take 1 tablet (40 mg total) by mouth 2 (two) times daily.   ibuprofen 600 MG tablet Commonly known as:  ADVIL,MOTRIN Take 1 tablet (600 mg total) by mouth every 6 (six) hours as needed for fever, headache, moderate pain or cramping.   insulin aspart 100 UNIT/ML injection Commonly known as:  novoLOG Inject 5 Units into the skin 3 (three) times daily with meals. Replaces:  insulin aspart 100 UNIT/ML FlexPen   insulin glargine 100 UNIT/ML injection Commonly known as:  LANTUS Inject 0.12 mLs (12 Units total) into the skin at bedtime. Replaces:  BASAGLAR KWIKPEN 100 UNIT/ML Sopn   oxyCODONE-acetaminophen 5-325 MG tablet Commonly known as:  PERCOCET/ROXICET Take 1-2 tablets by mouth every 4 (four) hours as needed for moderate pain or severe pain.   potassium chloride SA 20 MEQ tablet Commonly known as:  K-DUR,KLOR-CON Take 2 tablets (40 mEq total) by mouth daily.   prenatal multivitamin Tabs tablet Take 1 tablet by mouth daily at 12 noon.            Discharge Care Instructions  (From admission, onward)         Start     Ordered   12/29/17 0000  Discharge wound care:     Comments:  As per discharge handout and nursing instructions   12/29/17 1130          Diet: routine diet  Activity: Advance as tolerated. Pelvic rest for 6 weeks.   Follow  up Appt:  1 week: Prevena removal, BP check and incision check 4 weeks: Postparum Check  Future Appointments  Date Time Provider [place]  01/12/2018  4:15 PM [place]  01/23/2018  1:00 PM [place]  01/23/2018  2:15 PM Woodroe Mode, MD [facility]   Postpartum contraception: Depo Provera, first dose given 12/29/2017  Newborn Data: Live born female  Birth Weight: 3 lb 14.8 oz (1780 g) APGAR: 8, 8  Newborn Delivery   Birth date/time:  12/26/2017 16:40:00 Delivery type:  C-Section, Low Transverse Trial of labor:  Yes C-section categorization:  Primary     Baby Feeding: Bottle and Breast Disposition:NICU   12/29/2017 Verita Schneiders, MD

## 2017-12-29 NOTE — Telephone Encounter (Signed)
Called and left a message for patient to give [name] a call about an appointment scheduled for her.

## 2017-12-29 NOTE — Progress Notes (Signed)
Postpartum discharge instructions reviewed with patient. Discussed instructions, medication changes and follow up care for pervena. Patient verbalized understanding.

## 2017-12-30 ENCOUNTER — Encounter (INDEPENDENT_AMBULATORY_CARE_PROVIDER_SITE_OTHER): Payer: BC Managed Care – PPO | Admitting: Ophthalmology

## 2018-01-05 ENCOUNTER — Ambulatory Visit (INDEPENDENT_AMBULATORY_CARE_PROVIDER_SITE_OTHER): Payer: BC Managed Care – PPO | Admitting: *Deleted

## 2018-01-05 VITALS — BP 143/94 | HR 103 | Wt 187.2 lb

## 2018-01-05 DIAGNOSIS — Z029 Encounter for administrative examinations, unspecified: Secondary | ICD-10-CM

## 2018-01-05 DIAGNOSIS — Z5189 Encounter for other specified aftercare: Secondary | ICD-10-CM

## 2018-01-05 DIAGNOSIS — Z0130 Encounter for examination of blood pressure without abnormal findings: Secondary | ICD-10-CM

## 2018-01-05 NOTE — Progress Notes (Signed)
Here for wound check, prevena removal, blood pressure check.  States having headaches every other day, today woke up with headache =4. States taking tylenol or ibuprofen and tries to go to sleep and sometimes that helps, sometimes not. C/o sores on legs that were leaking fluid,not now. Wearing support hose.  Had patient remove  Hose- noted healing ulcers on legs- pink, no discharge. She states had these while in hospital and they put neosporin on them but she has just bathed and used hydrogen peroxide. Instructed patient to bathe legs daily and use neosporin twice daily. Removed prevena which was still running and dressing which was intact and saturated with pink/red discharge with odor. Wound intact, no redness or edema= noted dried discharge. Rinsed wound with saline and gauze. Instructed patient to bathe wound daily and keep clean and dry, may cover with gauze or pad if oozing noted. Instructed to check wound daily and call us  Or go to [facility] if any wound issues .   Discussed bp 's elevated today and history and assessment with Dr.Dove . Instructed patient to continue vasoctec bid as ordered and come back one week for bp check per Dr. Hulan Fray. Also instructed patient may take tylenol and or ibuprofen for headaches- come to [facility] for severe headache or sudden worsening of edema. She voices understanding.

## 2018-01-06 NOTE — Progress Notes (Signed)
I have reviewed this chart and agree with the RN/CMA assessment and management.    Mckinzee Spirito C Shriyans Kuenzi, MD, FACOG Attending Physician, [facility]  

## 2018-01-12 ENCOUNTER — Ambulatory Visit (INDEPENDENT_AMBULATORY_CARE_PROVIDER_SITE_OTHER): Payer: BC Managed Care – PPO

## 2018-01-12 ENCOUNTER — Other Ambulatory Visit: Payer: Self-pay | Admitting: Student

## 2018-01-12 ENCOUNTER — Ambulatory Visit: Payer: Self-pay

## 2018-01-12 VITALS — BP 142/80

## 2018-01-12 DIAGNOSIS — Z0130 Encounter for examination of blood pressure without abnormal findings: Secondary | ICD-10-CM

## 2018-01-12 MED ORDER — HYDROCHLOROTHIAZIDE 25 MG PO TABS: 25.0000 mg | ORAL_TABLET | Freq: Every day | ORAL | 1 refills | Status: DC

## 2018-01-12 NOTE — Progress Notes (Signed)
Chart reviewed for nurse visit. Agree with plan of care.   Starr Lake, [place] 01/12/2018 7:57 PM

## 2018-01-12 NOTE — Progress Notes (Signed)
Pt here today for a BP check s/p elevated pressures during nurse visit, pt currently taking Vasotec BID and wound check. Pt has hx of CHTN.  BP 142/80 manually RA. Pt reported pain at site, upon observation she wore tight underwear that was on the incision site. Encouraged pt to wear underwear that can be pulled above incision. Incision was well approximated, no edema, no redness, no drainage. Pt advised to monitor for infection and to contact office with any concerns. Notified Maye Hides, CNM of pt BP. Providers recommendation to start HCTZ 25mg  PO daily and continue to take Vasotec PO BID as prescribed. Advised pt on hypertensive precautions and we will follow up at [facility] visit. Pt verbalized understanding with no further questions.

## 2018-01-12 NOTE — Progress Notes (Signed)
-  Patient is a chronic hypertensive; BP today is normal for her; she had this BP at nurse visit last week and no change in medication. Will add HCTZ and reassess at visit in 10 days; patient instructed on warning signs and when to return to [facility].

## 2018-01-22 ENCOUNTER — Observation Stay (HOSPITAL_COMMUNITY)
Admission: EM | Admit: 2018-01-22 | Discharge: 2018-01-23 | Disposition: A | Discharge status: Home / Self Care | Payer: BC Managed Care – PPO | Attending: Internal Medicine | Admitting: Internal Medicine

## 2018-01-22 ENCOUNTER — Encounter (HOSPITAL_COMMUNITY): Payer: Self-pay

## 2018-01-22 ENCOUNTER — Emergency Department (HOSPITAL_COMMUNITY): Payer: BC Managed Care – PPO

## 2018-01-22 ENCOUNTER — Other Ambulatory Visit: Payer: Self-pay

## 2018-01-22 DIAGNOSIS — R112 Nausea with vomiting, unspecified: Principal | ICD-10-CM | POA: Diagnosis present

## 2018-01-22 DIAGNOSIS — E876 Hypokalemia: Secondary | ICD-10-CM

## 2018-01-22 DIAGNOSIS — I10 Essential (primary) hypertension: Secondary | ICD-10-CM | POA: Diagnosis present

## 2018-01-22 DIAGNOSIS — Z79899 Other long term (current) drug therapy: Secondary | ICD-10-CM | POA: Diagnosis not present

## 2018-01-22 DIAGNOSIS — Z794 Long term (current) use of insulin: Secondary | ICD-10-CM | POA: Diagnosis not present

## 2018-01-22 DIAGNOSIS — E10319 Type 1 diabetes mellitus with unspecified diabetic retinopathy without macular edema: Secondary | ICD-10-CM | POA: Diagnosis not present

## 2018-01-22 DIAGNOSIS — E1022 Type 1 diabetes mellitus with diabetic chronic kidney disease: Secondary | ICD-10-CM | POA: Diagnosis present

## 2018-01-22 DIAGNOSIS — R9431 Abnormal electrocardiogram [ECG] [EKG]: Secondary | ICD-10-CM | POA: Diagnosis not present

## 2018-01-22 DIAGNOSIS — Z791 Long term (current) use of non-steroidal anti-inflammatories (NSAID): Secondary | ICD-10-CM | POA: Diagnosis not present

## 2018-01-22 DIAGNOSIS — R1114 Bilious vomiting: Secondary | ICD-10-CM | POA: Diagnosis present

## 2018-01-22 DIAGNOSIS — Z833 Family history of diabetes mellitus: Secondary | ICD-10-CM | POA: Insufficient documentation

## 2018-01-22 DIAGNOSIS — I160 Hypertensive urgency: Secondary | ICD-10-CM | POA: Diagnosis not present

## 2018-01-22 DIAGNOSIS — E1021 Type 1 diabetes mellitus with diabetic nephropathy: Secondary | ICD-10-CM | POA: Insufficient documentation

## 2018-01-22 DIAGNOSIS — E109 Type 1 diabetes mellitus without complications: Secondary | ICD-10-CM | POA: Diagnosis present

## 2018-01-22 HISTORY — DX: Hypokalemia: E87.6

## 2018-01-22 HISTORY — DX: Nausea with vomiting, unspecified: R11.2

## 2018-01-22 LAB — URINALYSIS, ROUTINE W REFLEX MICROSCOPIC
Bacteria, UA: NONE SEEN
Bilirubin Urine: NEGATIVE
Glucose, UA: >=500 mg/dL — AB
KETONES UR: 80 mg/dL — AB
LEUKOCYTES UA: NEGATIVE
Nitrite: NEGATIVE
Protein, ur: >=300 mg/dL — AB
Specific Gravity, Urine: 1.016 (ref 1.005–1.030)
pH: 7 (ref 5.0–8.0)

## 2018-01-22 LAB — BLOOD GAS, VENOUS
Acid-Base Excess: 4.6 mmol/L — ABNORMAL HIGH (ref 0.0–2.0)
Bicarbonate: 26.1 mmol/L (ref 20.0–28.0)
O2 Saturation: 52 %
Patient temperature: 98.6
pCO2, Ven: 31.4 mmHg — ABNORMAL LOW (ref 44.0–60.0)
pH, Ven: 7.531 — ABNORMAL HIGH (ref 7.250–7.430)

## 2018-01-22 LAB — COMPREHENSIVE METABOLIC PANEL
ALT: 18 U/L (ref 0–44)
AST: 22 U/L (ref 15–41)
Albumin: 2.4 g/dL — ABNORMAL LOW (ref 3.5–5.0)
Alkaline Phosphatase: 55 U/L (ref 38–126)
Anion gap: 14 (ref 5–15)
BUN: 9 mg/dL (ref 6–20)
CO2: 23 mmol/L (ref 22–32)
Calcium: 8.7 mg/dL — ABNORMAL LOW (ref 8.9–10.3)
Chloride: 97 mmol/L — ABNORMAL LOW (ref 98–111)
Creatinine, Ser: 0.91 mg/dL (ref 0.44–1.00)
GFR calc Af Amer: >60 mL/min (ref >60)
GFR calc non Af Amer: >60 mL/min (ref >60)
Glucose, Bld: 365 mg/dL — ABNORMAL HIGH (ref 70–99)
Potassium: 2.4 mmol/L — CL (ref 3.5–5.1)
Sodium: 134 mmol/L — ABNORMAL LOW (ref 135–145)
Total Bilirubin: 0.9 mg/dL (ref 0.3–1.2)
Total Protein: 6.5 g/dL (ref 6.5–8.1)

## 2018-01-22 LAB — CBC
HCT: 36.3 % (ref 36.0–46.0)
Hemoglobin: 11.8 g/dL — ABNORMAL LOW (ref 12.0–15.0)
MCH: 27.8 pg (ref 26.0–34.0)
MCHC: 32.5 g/dL (ref 30.0–36.0)
MCV: 85.4 fL (ref 80.0–100.0)
Platelets: 668 10*3/uL — ABNORMAL HIGH (ref 150–400)
RBC: 4.25 MIL/uL (ref 3.87–5.11)
RDW: 13.7 % (ref 11.5–15.5)
WBC: 8.3 10*3/uL (ref 4.0–10.5)
nRBC: 0 % (ref 0.0–0.2)

## 2018-01-22 LAB — I-STAT BETA HCG BLOOD, ED (MC, WL, AP ONLY): I-stat hCG, quantitative: <5 m[IU]/mL (ref <5)

## 2018-01-22 LAB — LIPASE, BLOOD: Lipase: 27 U/L (ref 11–51)

## 2018-01-22 IMAGING — CR DG CHEST 2V
2 series · 2 of 2 positions shown · non-contrast
Comparison: None

CLINICAL DATA: Nausea, vomiting, and cough since yesterday, history
diabetes mellitus, hypertension

EXAM:
CHEST - 2 VIEW

[w chest pa]
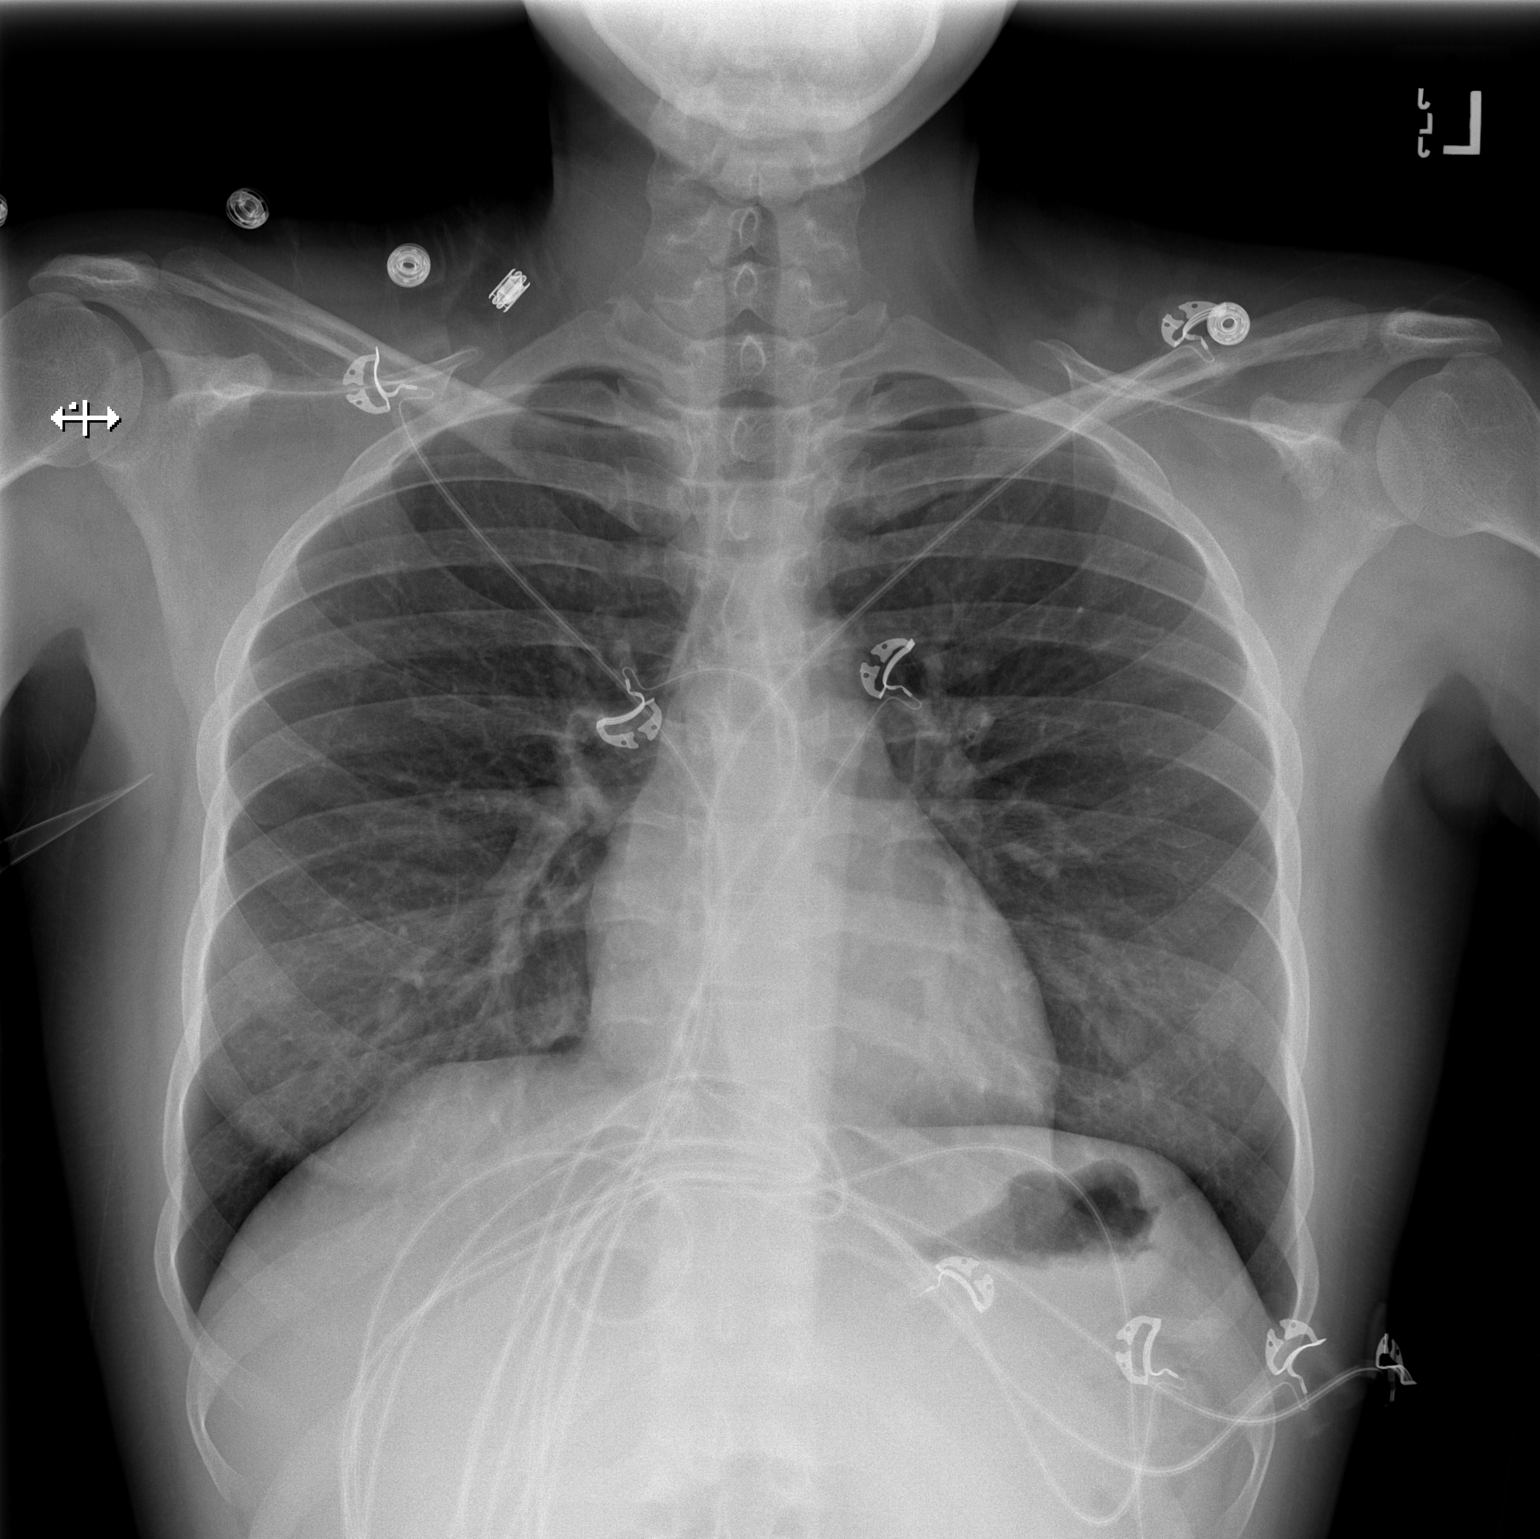

[w chest lat]
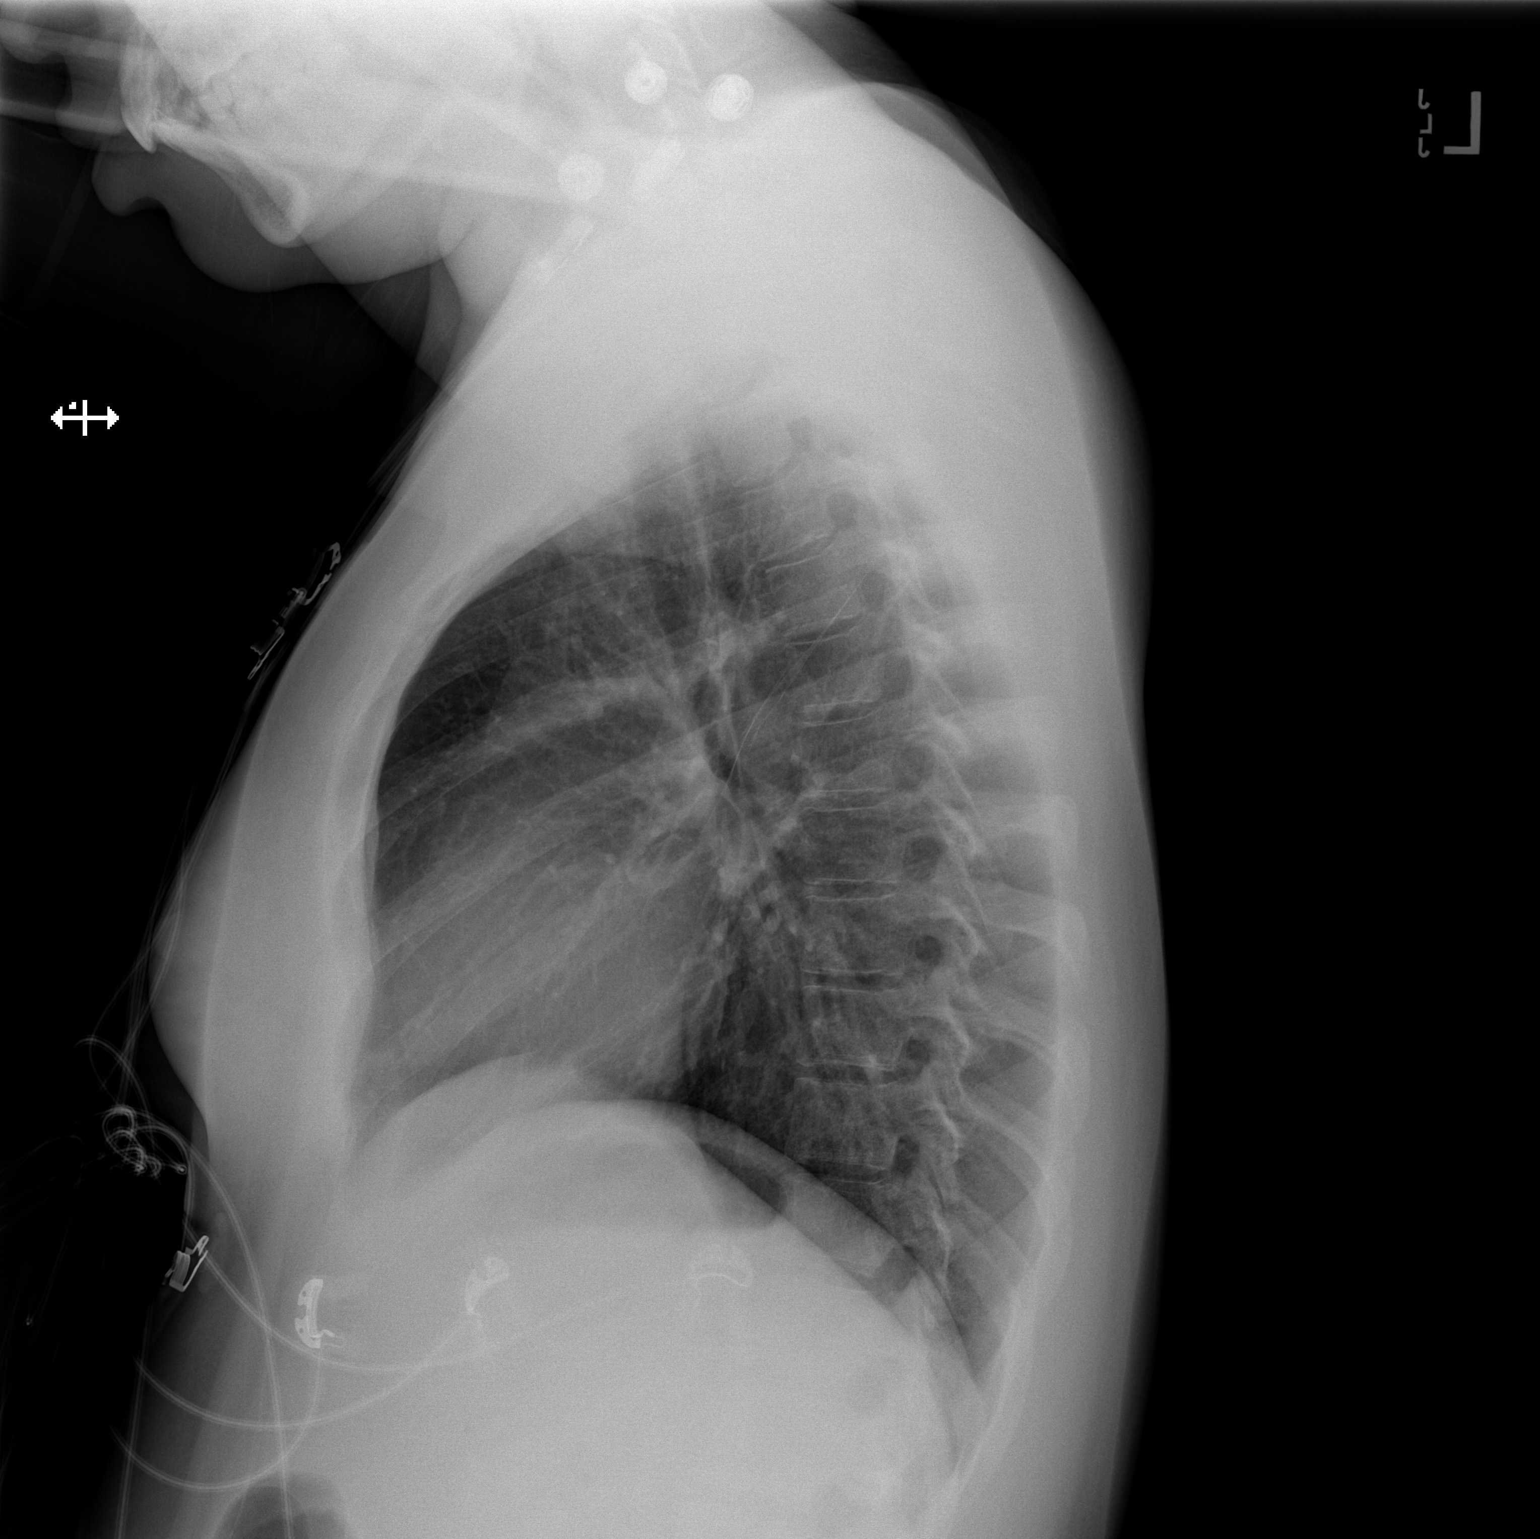

[2 of 2 positions shown; findings below may reference images not displayed]

FINDINGS: Normal heart size, mediastinal contours, and pulmonary vascularity.

Probable LEFT nipple shadow, seen on lateral view as well.

Lungs clear.

No definite infiltrate, pleural effusion or pneumothorax.

Bones unremarkable.
IMPRESSION: No acute abnormalities.

## 2018-01-22 MED ORDER — INSULIN ASPART 100 UNIT/ML ~~LOC~~ SOLN
0.0000 [IU] | SUBCUTANEOUS | Status: DC
  Administered 2018-01-23: 7 [IU] via SUBCUTANEOUS
  Administered 2018-01-23: 2 [IU] via SUBCUTANEOUS
  Administered 2018-01-23: 3 [IU] via SUBCUTANEOUS
  Administered 2018-01-23: 2 [IU] via SUBCUTANEOUS

## 2018-01-22 MED ORDER — POTASSIUM CHLORIDE IN NACL 40-0.9 MEQ/L-% IV SOLN
INTRAVENOUS | Status: DC
  Administered 2018-01-23: 100 mL/h via INTRAVENOUS
  Filled 2018-01-22 (×3): qty 1000

## 2018-01-22 MED ORDER — ONDANSETRON 4 MG PO TBDP
4.0000 mg | ORAL_TABLET | Freq: Once | ORAL | Status: DC | PRN
  Filled 2018-01-22: qty 1

## 2018-01-22 MED ORDER — ACETAMINOPHEN 325 MG PO TABS: 650.0000 mg | ORAL_TABLET | Freq: Four times a day (QID) | ORAL | Status: DC | PRN

## 2018-01-22 MED ORDER — HYDRALAZINE HCL 20 MG/ML IJ SOLN: 10.0000 mg | INTRAMUSCULAR | Status: DC | PRN

## 2018-01-22 MED ORDER — POTASSIUM CHLORIDE 10 MEQ/100ML IV SOLN
10.0000 meq | INTRAVENOUS | Status: AC
  Administered 2018-01-23 (×3): 10 meq via INTRAVENOUS
  Filled 2018-01-22 (×2): qty 100

## 2018-01-22 MED ORDER — MAGNESIUM SULFATE 2 GM/50ML IV SOLN
2.0000 g | Freq: Once | INTRAVENOUS | Status: AC
  Administered 2018-01-23: 2 g via INTRAVENOUS
  Filled 2018-01-22: qty 50

## 2018-01-22 MED ORDER — ONDANSETRON HCL 4 MG PO TABS: 4.0000 mg | ORAL_TABLET | Freq: Four times a day (QID) | ORAL | Status: DC | PRN

## 2018-01-22 MED ORDER — POTASSIUM CHLORIDE IN NACL 20-0.9 MEQ/L-% IV SOLN
Freq: Once | INTRAVENOUS | Status: AC
  Administered 2018-01-22: via INTRAVENOUS
  Filled 2018-01-22: qty 1000

## 2018-01-22 MED ORDER — PROCHLORPERAZINE EDISYLATE 10 MG/2ML IJ SOLN
10.0000 mg | Freq: Once | INTRAMUSCULAR | Status: AC
  Administered 2018-01-22: 10 mg via INTRAVENOUS
  Filled 2018-01-22: qty 2

## 2018-01-22 MED ORDER — SODIUM CHLORIDE 0.9% FLUSH: 3.0000 mL | Freq: Two times a day (BID) | INTRAVENOUS | Status: DC

## 2018-01-22 MED ORDER — ONDANSETRON HCL 4 MG/2ML IJ SOLN
4.0000 mg | Freq: Once | INTRAMUSCULAR | Status: AC
  Administered 2018-01-22: 4 mg via INTRAVENOUS
  Filled 2018-01-22: qty 2

## 2018-01-22 MED ORDER — HYDRALAZINE HCL 20 MG/ML IJ SOLN
10.0000 mg | INTRAMUSCULAR | Status: AC
  Administered 2018-01-22: 10 mg via INTRAVENOUS
  Filled 2018-01-22: qty 1

## 2018-01-22 MED ORDER — INSULIN GLARGINE 100 UNIT/ML ~~LOC~~ SOLN
12.0000 [IU] | Freq: Every day | SUBCUTANEOUS | Status: DC
  Administered 2018-01-23: 12 [IU] via SUBCUTANEOUS
  Filled 2018-01-22: qty 0.12

## 2018-01-22 MED ORDER — ENOXAPARIN SODIUM 40 MG/0.4ML ~~LOC~~ SOLN: 40.0000 mg | Freq: Every day | SUBCUTANEOUS | Status: DC

## 2018-01-22 MED ORDER — ONDANSETRON HCL 4 MG/2ML IJ SOLN: 4.0000 mg | Freq: Four times a day (QID) | INTRAMUSCULAR | Status: DC | PRN

## 2018-01-22 MED ORDER — ACETAMINOPHEN 650 MG RE SUPP: 650.0000 mg | Freq: Four times a day (QID) | RECTAL | Status: DC | PRN

## 2018-01-22 MED ORDER — SODIUM CHLORIDE 0.9 % IV BOLUS
1000.0000 mL | Freq: Once | INTRAVENOUS | Status: AC
  Administered 2018-01-22: 1000 mL via INTRAVENOUS

## 2018-01-22 MED ORDER — PROMETHAZINE HCL 25 MG/ML IJ SOLN: 12.5000 mg | Freq: Four times a day (QID) | INTRAMUSCULAR | Status: DC | PRN

## 2018-01-22 NOTE — ED Triage Notes (Signed)
States since yesterday nausea and vomiting no fever, no abdominal pain voiced no diarrhea voiced, no dizzness noted.

## 2018-01-22 NOTE — ED Provider Notes (Signed)
[facility] DEPT Provider Note   CSN: [id number] Arrival date & time: 01/22/18  2055     History   Chief Complaint Chief Complaint  Patient presents with  . Emesis    HPI Barbara Donovan is a 28 y.o. female.  HPI   This young female with insulin-dependent diabetes presents with nausea, vomiting.  She notes that over the past week she has had mild URI-like illness, has been inconsistent with her medication use, but was generally well until yesterday. Since that time she has had persistent nausea, anorexia, multiple episodes of vomiting. No diarrhea, no abdominal pain when she is not vomiting, no fever. Patient has a notable recent history of C-section delivery, about 5 weeks ago, she denies other complications other than prematurity, states that she was doing generally well until the respiratory illness within the past week. Patient does not smoke. No relief with anything over the past day.    Past Medical History:  Diagnosis Date  . Diabetes ([place])    TYPE I  . Hypertension complicating pregnancy     Patient Active Problem List   Diagnosis Date Noted  . 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 Diabetes mellitus complicating pregnancy, antepartum 12/22/2017  . Intrauterine growth restriction (IUGR) affecting care of mother 12/22/2017  . 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 Pregnancy complicated by multiple fetal congenital anomalies 10/30/2017  . Ventricular septal defect (VSD) of fetus in singleton pregnancy, antepartum 09/30/2017  . Chronic hypertension 08/21/2017  . Depression during pregnancy, antepartum 07/07/2017  . Supervision of high risk pregnancy, antepartum 07/07/2017  . Type 1 diabetes mellitus affecting pregnancy, antepartum 07/07/2017  . Chronic hypertension affecting pregnancy 07/07/2017  . Diabetic retinopathy ([place]) 06/09/2017  . Acute depression 11/02/2014  . Asthma 10/30/2013    Past  Surgical History:  Procedure Laterality Date  . CESAREAN SECTION N/A 12/26/2017   Procedure: CESAREAN SECTION;  Surgeon: Osborne Oman, MD;  Location: [facility];  Service: Obstetrics;  Laterality: N/A;  . NO PAST SURGERIES    . WISDOM TOOTH EXTRACTION       OB History    Gravida  1   Para  1   Term      Preterm  1   AB      Living  1     SAB      TAB      Ectopic      Multiple  0   Live Births  1            Home Medications    Prior to Admission medications   Medication Sig Start Date End Date Taking? Authorizing Provider  Blood Pressure Monitoring (BLOOD PRESSURE CUFF) MISC 1 kit by Does not apply route daily. 11/14/17  Yes Aura Camps, MD  docusate sodium (COLACE) 100 MG capsule Take 1 capsule (100 mg total) by mouth 2 (two) times daily as needed for mild constipation or moderate constipation. 12/29/17  Yes Anyanwu, Sallyanne Havers, MD  enalapril (VASOTEC) 20 MG tablet Take 1 tablet (20 mg total) by mouth 2 (two) times daily. 12/29/17  Yes Anyanwu, Sallyanne Havers, MD  famotidine (PEPCID) 20 MG tablet Take 20 mg by mouth daily as needed for heartburn.  10/15/17  Yes [provider]  ferrous sulfate 325 (65 FE) MG tablet Take 1 tablet (325 mg total) by mouth 2 (two) times daily with a meal. 12/29/17  Yes Anyanwu, Sallyanne Havers, MD  hydrochlorothiazide (HYDRODIURIL) 25  MG tablet Take 1 tablet (25 mg total) by mouth daily. 01/12/18  Yes Starr Lake, CNM  ibuprofen (ADVIL,MOTRIN) 600 MG tablet Take 1 tablet (600 mg total) by mouth every 6 (six) hours as needed for fever, headache, moderate pain or cramping. 12/29/17  Yes Anyanwu, Sallyanne Havers, MD  insulin aspart (NOVOLOG) 100 UNIT/ML injection Inject 5 Units into the skin 3 (three) times daily with meals. Patient taking differently: Inject 7 Units into the skin 3 (three) times daily with meals.  12/29/17  Yes Anyanwu, Sallyanne Havers, MD  insulin glargine (LANTUS) 100 UNIT/ML injection Inject 0.12 mLs (12  Units total) into the skin at bedtime. Patient taking differently: Inject 25 Units into the skin at bedtime.  12/29/17  Yes Anyanwu, Sallyanne Havers, MD  oxyCODONE-acetaminophen (PERCOCET/ROXICET) 5-325 MG tablet Take 1-2 tablets by mouth every 4 (four) hours as needed for moderate pain or severe pain. 12/29/17  Yes Anyanwu, Sallyanne Havers, MD  Prenatal Vit-Fe Fumarate-FA (PRENATAL MULTIVITAMIN) TABS tablet Take 1 tablet by mouth daily at 12 noon.   Yes [provider]  Pyridoxine HCl (B-6 PO) Take 2 tablets by mouth daily.    Yes [provider]  furosemide (LASIX) 40 MG tablet Take 1 tablet (40 mg total) by mouth 2 (two) times daily. Patient not taking: Reported on 01/12/2018 12/29/17   Osborne Oman, MD  potassium chloride SA (K-DUR,KLOR-CON) 20 MEQ tablet Take 2 tablets (40 mEq total) by mouth daily. Patient not taking: Reported on 01/12/2018 12/29/17   Osborne Oman, MD    Family History Family History  Problem Relation Age of Onset  . Diabetes Mother   . Diabetes Father   . Cataracts Father   . Amblyopia Neg Hx   . Blindness Neg Hx   . Glaucoma Neg Hx   . Macular degeneration Neg Hx   . Retinal detachment Neg Hx   . Strabismus Neg Hx   . Retinitis pigmentosa Neg Hx     Social History Social History   Tobacco Use  . Smoking status: Never Smoker  . Smokeless tobacco: Never Used  Substance Use Topics  . Alcohol use: Not Currently  . Drug use: Never     Allergies   Patient has no known allergies.   Review of Systems Review of Systems  Constitutional:       Per HPI, otherwise negative  HENT:       Per HPI, otherwise negative  Respiratory:       Per HPI, otherwise negative  Cardiovascular:       Per HPI, otherwise negative  Gastrointestinal: Positive for nausea and vomiting.  Endocrine:       Negative aside from HPI  Genitourinary:       Neg aside from HPI   Musculoskeletal:       Per HPI, otherwise negative  Skin: Negative.   Neurological:  Negative for syncope.     Physical Exam Updated Vital Signs BP (!) 190/103 (BP Location: Right Arm) Comment: rn notified  Pulse 92   Temp 97.8 F (36.6 C) (Oral)   Resp 14   Ht 5' 6"  (1.676 m)   Wt 84.9 kg   LMP 04/02/2017 (Approximate)   SpO2 100%   BMI 30.21 kg/m   Physical Exam Vitals signs and nursing note reviewed.  Constitutional:      General: She is not in acute distress.    Appearance: She is well-developed.  HENT:     Head: Normocephalic and atraumatic.  Eyes:  Conjunctiva/sclera: Conjunctivae normal.  Cardiovascular:     Rate and Rhythm: Regular rhythm. Tachycardia present.  Pulmonary:     Effort: Pulmonary effort is normal. No respiratory distress.     Breath sounds: Normal breath sounds. No stridor.  Abdominal:     General: There is no distension.     Tenderness: There is no abdominal tenderness. There is no guarding.  Skin:    General: Skin is warm and dry.  Neurological:     Mental Status: She is alert and oriented to person, place, and time.     Cranial Nerves: No cranial nerve deficit.      ED Treatments / Results  Labs (all labs ordered are listed, but only abnormal results are displayed) Labs Reviewed  COMPREHENSIVE METABOLIC PANEL - Abnormal; Notable for the following components:      Result Value   Sodium 134 (*)    Potassium 2.4 (*)    Chloride 97 (*)    Glucose, Bld 365 (*)    Calcium 8.7 (*)    Albumin 2.4 (*)    All other components within normal limits  CBC - Abnormal; Notable for the following components:   Hemoglobin 11.8 (*)    Platelets 668 (*)    All other components within normal limits  URINALYSIS, ROUTINE W REFLEX MICROSCOPIC - Abnormal; Notable for the following components:   Glucose, UA >=500 (*)    Hgb urine dipstick SMALL (*)    Ketones, ur 80 (*)    Protein, ur >=300 (*)    All other components within normal limits  BLOOD GAS, VENOUS - Abnormal; Notable for the following components:   pH, Ven 7.531 (*)     pCO2, Ven 31.4 (*)    Acid-Base Excess 4.6 (*)    All other components within normal limits  LIPASE, BLOOD  INFLUENZA PANEL BY PCR (TYPE A & B)  I-STAT BETA HCG BLOOD, ED ([facility], [facility], [facility] ONLY)    EKG None  Radiology No results found.  Procedures Procedures (including critical care time)  Medications Ordered in ED Medications  sodium chloride 0.9 % bolus 1,000 mL (1,000 mLs Intravenous New Bag/Given 01/22/18 2238)  hydrALAZINE (APRESOLINE) injection 10 mg (has no administration in time range)  prochlorperazine (COMPAZINE) injection 10 mg (has no administration in time range)  0.9 % NaCl with KCl 20 mEq/ L  infusion (has no administration in time range)  ondansetron (ZOFRAN) injection 4 mg (4 mg Intravenous Given 01/22/18 2239)     Initial Impression / Assessment and Plan / ED Course  I have reviewed the triage vital signs and the nursing notes.  Pertinent labs & imaging results that were available during my care of the patient were reviewed by me and considered in my medical decision making (see chart for details).     11:19 PM Patient remains nauseous. Vitals notable for hypertension, and though she states that she has a history of gestational hypertension, chart review notable for pregnancy worsening her hypertension, which is described as chronic. Given the patient's elevated blood pressure reading, she has received hydralazine. With persistent nausea, vomiting, she is received Compazine, Zofran. She was found to have substantial abnormality of her potassium level, 2.4, given inability to tolerate oral intake, will require IV potassium, continued fluids, blood pressure control. Patient will require admission for further evaluation and management.   Final Clinical Impressions(s) / ED Diagnoses  Nausea and vomiting Hypokalemia Hypertensive urgency   Carmin Muskrat, MD 01/22/18 2320

## 2018-01-22 NOTE — ED Notes (Signed)
Urine culture was sent down with urine specimen.

## 2018-01-22 NOTE — H&P (Signed)
History and Physical    Ronisha Herringshaw CXK:[id number] DOB: Feb 11, 1990 DOA: 01/22/2018  PCP: Patient, No Pcp Per   Patient coming from: Home   Chief Complaint: Nausea, vomiting  HPI: Barbara Donovan is a 28 y.o. female with medical history significant for type 1 diabetes mellitus and hypertension, now presenting to the emergency department for evaluation of nausea and vomiting.  Patient reports several days of upper respiratory symptoms, and then developed nausea with recurrent nonbloody vomiting yesterday.  She has not noted any fevers or chills, upper respiratory symptoms have improved, but she reports severe ongoing nausea with recurrent vomiting and has been unable to keep her oral medications down.  She denies abdominal pain or diarrhea.  ED Course: Upon arrival to the ED, patient is found to be afebrile, saturating well on room air, slightly tachycardic, and hypertensive to 190/100.  EKG features a sinus rhythm with QTc interval 505 ms.  Chest x-ray is negative for acute cardiopulmonary disease.  Chemistry panel is notable for a glucose of 365 with normal bicarbonate and normal anion gap, slight hyponatremia, and potassium 2.4.  CBC features a thrombocytosis with platelets 668,000.  Urinalysis is notable for glucosuria, ketonuria, and proteinuria.  Patient was given a liter of normal saline, 10 mg IV hydralazine, Compazine, Zofran, and IV potassium.  Influenza PCR was ordered but not yet sent.  Blood pressure improved with hydralazine but patient remains unable to tolerate anything by mouth and will be observed for ongoing IV fluid hydration and parenteral electrolyte replacement.  Review of Systems:  All other systems reviewed and apart from HPI, are negative.  Past Medical History:  Diagnosis Date  . Diabetes ([place])    TYPE I  . Hypertension complicating pregnancy     Past Surgical History:  Procedure Laterality Date  . CESAREAN SECTION N/A 12/26/2017   Procedure: CESAREAN SECTION;   Surgeon: Osborne Oman, MD;  Location: [facility];  Service: Obstetrics;  Laterality: N/A;  . NO PAST SURGERIES    . WISDOM TOOTH EXTRACTION       reports that she has never smoked. She has never used smokeless tobacco. She reports previous alcohol use. She reports that she does not use drugs.  No Known Allergies  Family History  Problem Relation Age of Onset  . Diabetes Mother   . Diabetes Father   . Cataracts Father   . Amblyopia Neg Hx   . Blindness Neg Hx   . Glaucoma Neg Hx   . Macular degeneration Neg Hx   . Retinal detachment Neg Hx   . Strabismus Neg Hx   . Retinitis pigmentosa Neg Hx      Prior to Admission medications   Medication Sig Start Date End Date Taking? Authorizing Provider  Blood Pressure Monitoring (BLOOD PRESSURE CUFF) MISC 1 kit by Does not apply route daily. 11/14/17  Yes Aura Camps, MD  docusate sodium (COLACE) 100 MG capsule Take 1 capsule (100 mg total) by mouth 2 (two) times daily as needed for mild constipation or moderate constipation. 12/29/17  Yes Anyanwu, Sallyanne Havers, MD  enalapril (VASOTEC) 20 MG tablet Take 1 tablet (20 mg total) by mouth 2 (two) times daily. 12/29/17  Yes Anyanwu, Sallyanne Havers, MD  famotidine (PEPCID) 20 MG tablet Take 20 mg by mouth daily as needed for heartburn.  10/15/17  Yes [provider]  ferrous sulfate 325 (65 FE) MG tablet Take 1 tablet (325 mg total) by mouth 2 (two) times daily with a meal. 12/29/17  Yes Anyanwu, Sallyanne Havers, MD  hydrochlorothiazide (HYDRODIURIL) 25 MG tablet Take 1 tablet (25 mg total) by mouth daily. 01/12/18  Yes Starr Lake, CNM  ibuprofen (ADVIL,MOTRIN) 600 MG tablet Take 1 tablet (600 mg total) by mouth every 6 (six) hours as needed for fever, headache, moderate pain or cramping. 12/29/17  Yes Anyanwu, Sallyanne Havers, MD  insulin aspart (NOVOLOG) 100 UNIT/ML injection Inject 5 Units into the skin 3 (three) times daily with meals. Patient taking differently: Inject 7 Units  into the skin 3 (three) times daily with meals.  12/29/17  Yes Anyanwu, Sallyanne Havers, MD  insulin glargine (LANTUS) 100 UNIT/ML injection Inject 0.12 mLs (12 Units total) into the skin at bedtime. Patient taking differently: Inject 25 Units into the skin at bedtime.  12/29/17  Yes Anyanwu, Sallyanne Havers, MD  oxyCODONE-acetaminophen (PERCOCET/ROXICET) 5-325 MG tablet Take 1-2 tablets by mouth every 4 (four) hours as needed for moderate pain or severe pain. 12/29/17  Yes Anyanwu, Sallyanne Havers, MD  Prenatal Vit-Fe Fumarate-FA (PRENATAL MULTIVITAMIN) TABS tablet Take 1 tablet by mouth daily at 12 noon.   Yes [provider]  Pyridoxine HCl (B-6 PO) Take 2 tablets by mouth daily.    Yes [provider]  furosemide (LASIX) 40 MG tablet Take 1 tablet (40 mg total) by mouth 2 (two) times daily. Patient not taking: Reported on 01/12/2018 12/29/17   Osborne Oman, MD  potassium chloride SA (K-DUR,KLOR-CON) 20 MEQ tablet Take 2 tablets (40 mEq total) by mouth daily. Patient not taking: Reported on 01/12/2018 12/29/17   Osborne Oman, MD    Physical Exam: Vitals:   01/22/18 2101 01/22/18 2102 01/22/18 2229 01/22/18 2338  BP: (!) 190/115  (!) 190/103 (!) 192/110  Pulse: (!) 117  92 87  Resp: (!) 24  14 18   Temp: 97.8 F (36.6 C)     TempSrc: Oral     SpO2: 100%  100% 100%  Weight:  84.9 kg    Height:  5' 6"  (1.676 m)      Constitutional: NAD, calm  Eyes: PERTLA, lids and conjunctivae normal ENMT: Mucous membranes are moist. Posterior pharynx clear of any exudate or lesions.   Neck: normal, supple, no masses, no thyromegaly Respiratory: clear to auscultation bilaterally, no wheezing, no crackles. Normal respiratory effort.   Cardiovascular: S1 & S2 heard, regular rate and rhythm. No extremity edema.   Abdomen: No distension, no tenderness, soft. Bowel sounds active.  Musculoskeletal: no clubbing / cyanosis. No joint deformity upper and lower extremities.    Skin: no significant  rashes, lesions, ulcers. Poor turgor. Neurologic: No facial asymmetry. Sensation intact. Moving all extremities.  Psychiatric: Sleeping, easily woken and oriented x 3. Calm, cooperative.    Labs on Admission: I have personally reviewed following labs and imaging studies  CBC: Recent Labs  Lab 01/22/18 2106  WBC 8.3  HGB 11.8*  HCT 36.3  MCV 85.4  PLT 482*   Basic Metabolic Panel: Recent Labs  Lab 01/22/18 2106  NA 134*  K 2.4*  CL 97*  CO2 23  GLUCOSE 365*  BUN 9  CREATININE 0.91  CALCIUM 8.7*   GFR: Estimated Creatinine Clearance: 101.9 mL/min (by C-G formula based on SCr of 0.91 mg/dL). Liver Function Tests: Recent Labs  Lab 01/22/18 2106  AST 22  ALT 18  ALKPHOS 55  BILITOT 0.9  PROT 6.5  ALBUMIN 2.4*   Recent Labs  Lab 01/22/18 2106  LIPASE 27   No results for input(s):  AMMONIA in the last 168 hours. Coagulation Profile: No results for input(s): INR, PROTIME in the last 168 hours. Cardiac Enzymes: No results for input(s): CKTOTAL, CKMB, CKMBINDEX, TROPONINI in the last 168 hours. BNP (last 3 results) No results for input(s): PROBNP in the last 8760 hours. HbA1C: No results for input(s): HGBA1C in the last 72 hours. CBG: No results for input(s): GLUCAP in the last 168 hours. Lipid Profile: No results for input(s): CHOL, HDL, LDLCALC, TRIG, CHOLHDL, LDLDIRECT in the last 72 hours. Thyroid Function Tests: No results for input(s): TSH, T4TOTAL, FREET4, T3FREE, THYROIDAB in the last 72 hours. Anemia Panel: No results for input(s): VITAMINB12, FOLATE, FERRITIN, TIBC, IRON, RETICCTPCT in the last 72 hours. Urine analysis:    Component Value Date/Time   COLORURINE YELLOW 01/22/2018 2106   APPEARANCEUR CLEAR 01/22/2018 2106   LABSPEC 1.016 01/22/2018 2106   PHURINE 7.0 01/22/2018 2106   GLUCOSEU >=500 (A) 01/22/2018 2106   HGBUR SMALL (A) 01/22/2018 2106   BILIRUBINUR NEGATIVE 01/22/2018 2106   KETONESUR 80 (A) 01/22/2018 2106   PROTEINUR >=300  (A) 01/22/2018 2106   UROBILINOGEN 1.0 12/22/2017 1329   NITRITE NEGATIVE 01/22/2018 2106   LEUKOCYTESUR NEGATIVE 01/22/2018 2106   Sepsis Labs: @LABRCNTIP (procalcitonin:4,lacticidven:4) )No results found for this or any previous visit (from the past 240 hour(s)).   Radiological Exams on Admission: Dg Chest 2 View  Result Date: 01/22/2018 CLINICAL DATA:  Nausea, vomiting, and cough since yesterday, history diabetes mellitus, hypertension EXAM: CHEST - 2 VIEW COMPARISON:  None FINDINGS: Normal heart size, mediastinal contours, and pulmonary vascularity. Probable LEFT nipple shadow, seen on lateral view as well. Lungs clear. No definite infiltrate, pleural effusion or pneumothorax. Bones unremarkable. IMPRESSION: No acute abnormalities. Electronically Signed   By: Lavonia Dana M.D.   On: 01/22/2018 23:24    EKG: Independently reviewed. Sinus rhythm, QTc 505 ms.   Assessment/Plan  1. Intractable nausea & vomiting  - Presents with one day of nausea & vomiting without abdominal pain, diarrhea, or fever; had been having URI sxs recently  - Unable to tolerate PO despite Zofran and compazine in ED and will need IVF and IV potassium  - Influenza PCR is pending  - Exam is benign  - Continue IVF hydration and electrolyte replacement, follow-up influenza PCR    2. Hypokalemia  - Serum potassium is 2.4 on admission with slightly prolonged QT interval  - She is unable to tolerate oral intake on admission d/t N/V  - Replace potassium IV for now, give empiric magnesium, continue cardiac monitoring, minimize QT-prolonging medications, repeat chem panel in am   3. Hypertension with hypertensive urgency  - BP 190/100 in ED  - She has been unable to keep her oral medications down and was given 10 mg IV hydralazine in ED with improvement  - Continue IV hydralazine as-needed and resume oral agents when her condition allows   4. Type I DM  - A1c was 6.6% in October  - Managed at home with Lantus 25  units qHS and Novolog 7 units TID  - She is not tolerating PO on admission - Check CBG's q4h, continue Lantus with dose-reduction to start, use SSI Novolog q4h    DVT prophylaxis: Lovenox  Code Status: Full  Family Communication: Discussed with patient  Consults called: None Admission status: Observation     Vianne Bulls, MD [facility] Pager [phone]  If 7PM-7AM, please contact night-coverage [email or web address] Password [email or web address]  01/22/2018, 11:40 PM

## 2018-01-23 ENCOUNTER — Ambulatory Visit: Payer: Self-pay | Admitting: Obstetrics & Gynecology

## 2018-01-23 DIAGNOSIS — R112 Nausea with vomiting, unspecified: Secondary | ICD-10-CM | POA: Diagnosis not present

## 2018-01-23 LAB — BASIC METABOLIC PANEL
Anion gap: 9 (ref 5–15)
BUN: 7 mg/dL (ref 6–20)
CO2: 23 mmol/L (ref 22–32)
Calcium: 7.9 mg/dL — ABNORMAL LOW (ref 8.9–10.3)
Chloride: 107 mmol/L (ref 98–111)
Creatinine, Ser: 0.88 mg/dL (ref 0.44–1.00)
GFR calc non Af Amer: >60 mL/min (ref >60)
Glucose, Bld: 209 mg/dL — ABNORMAL HIGH (ref 70–99)
Potassium: 2.4 mmol/L — CL (ref 3.5–5.1)
Sodium: 139 mmol/L (ref 135–145)

## 2018-01-23 LAB — CBC WITH DIFFERENTIAL/PLATELET
ABS IMMATURE GRANULOCYTES: 0.02 10*3/uL (ref 0.00–0.07)
Basophils Absolute: 0 10*3/uL (ref 0.0–0.1)
Basophils Relative: 1 %
Eosinophils Absolute: 0.1 10*3/uL (ref 0.0–0.5)
Eosinophils Relative: 1 %
HCT: 33.8 % — ABNORMAL LOW (ref 36.0–46.0)
HEMOGLOBIN: 10.5 g/dL — AB (ref 12.0–15.0)
Immature Granulocytes: 0 %
Lymphocytes Relative: 26 %
Lymphs Abs: 2 10*3/uL (ref 0.7–4.0)
MCH: 26.6 pg (ref 26.0–34.0)
MCHC: 31.1 g/dL (ref 30.0–36.0)
MCV: 85.8 fL (ref 80.0–100.0)
Monocytes Absolute: 0.8 10*3/uL (ref 0.1–1.0)
Monocytes Relative: 11 %
NEUTROS ABS: 4.5 10*3/uL (ref 1.7–7.7)
Neutrophils Relative %: 61 %
Platelets: 634 10*3/uL — ABNORMAL HIGH (ref 150–400)
RBC: 3.94 MIL/uL (ref 3.87–5.11)
RDW: 14 % (ref 11.5–15.5)
WBC: 7.4 10*3/uL (ref 4.0–10.5)
nRBC: 0.5 % — ABNORMAL HIGH (ref 0.0–0.2)

## 2018-01-23 LAB — INFLUENZA PANEL BY PCR (TYPE A & B)
Influenza A By PCR: NEGATIVE
Influenza B By PCR: NEGATIVE

## 2018-01-23 LAB — GLUCOSE, CAPILLARY
GLUCOSE-CAPILLARY: 157 mg/dL — AB (ref 70–99)
GLUCOSE-CAPILLARY: 154 mg/dL — AB (ref 70–99)
Glucose-Capillary: 226 mg/dL — ABNORMAL HIGH (ref 70–99)

## 2018-01-23 LAB — HIV ANTIBODY (ROUTINE TESTING W REFLEX): HIV Screen 4th Generation wRfx: NONREACTIVE

## 2018-01-23 LAB — MAGNESIUM: MAGNESIUM: 2.2 mg/dL (ref 1.7–2.4)

## 2018-01-23 LAB — CBG MONITORING, ED: Glucose-Capillary: 332 mg/dL — ABNORMAL HIGH (ref 70–99)

## 2018-01-23 MED ORDER — POTASSIUM CHLORIDE ER 10 MEQ PO TBCR: 20.0000 meq | EXTENDED_RELEASE_TABLET | Freq: Every day | ORAL | 0 refills | Status: DC

## 2018-01-23 MED ORDER — LORAZEPAM 2 MG/ML IJ SOLN: 0.5000 mg | Freq: Four times a day (QID) | INTRAMUSCULAR | Status: DC | PRN

## 2018-01-23 MED ORDER — POTASSIUM CHLORIDE CRYS ER 20 MEQ PO TBCR
40.0000 meq | EXTENDED_RELEASE_TABLET | ORAL | Status: AC
  Administered 2018-01-23 (×2): 40 meq via ORAL
  Filled 2018-01-23 (×2): qty 2

## 2018-01-23 MED ORDER — ONDANSETRON HCL 4 MG PO TABS: 4.0000 mg | ORAL_TABLET | Freq: Three times a day (TID) | ORAL | Status: DC | PRN

## 2018-01-23 MED ORDER — ONDANSETRON HCL 4 MG/2ML IJ SOLN: 4.0000 mg | Freq: Three times a day (TID) | INTRAMUSCULAR | Status: DC | PRN

## 2018-01-23 NOTE — Progress Notes (Signed)
Barbara Donovan to be D/C'd Home per MD order.  Discussed prescriptions and follow up appointments with the patient. Prescriptions given to patient, medication list explained in detail. Pt verbalized understanding.  Allergies as of 01/23/2018   No Known Allergies     Medication List    STOP taking these medications   docusate sodium 100 MG capsule Commonly known as:  COLACE   furosemide 40 MG tablet Commonly known as:  LASIX   ibuprofen 600 MG tablet Commonly known as:  ADVIL,MOTRIN   potassium chloride SA 20 MEQ tablet Commonly known as:  K-DUR,KLOR-CON     TAKE these medications   B-6 PO Take 2 tablets by mouth daily.   Blood Pressure Cuff Misc 1 kit by Does not apply route daily.   enalapril 20 MG tablet Commonly known as:  VASOTEC Take 1 tablet (20 mg total) by mouth 2 (two) times daily.   famotidine 20 MG tablet Commonly known as:  PEPCID Take 20 mg by mouth daily as needed for heartburn.   ferrous sulfate 325 (65 FE) MG tablet Take 1 tablet (325 mg total) by mouth 2 (two) times daily with a meal.   hydrochlorothiazide 25 MG tablet Commonly known as:  HYDRODIURIL Take 1 tablet (25 mg total) by mouth daily.   insulin aspart 100 UNIT/ML injection Commonly known as:  novoLOG Inject 5 Units into the skin 3 (three) times daily with meals. What changed:  how much to take   insulin glargine 100 UNIT/ML injection Commonly known as:  LANTUS Inject 0.12 mLs (12 Units total) into the skin at bedtime. What changed:  how much to take   oxyCODONE-acetaminophen 5-325 MG tablet Commonly known as:  PERCOCET/ROXICET Take 1-2 tablets by mouth every 4 (four) hours as needed for moderate pain or severe pain.   potassium chloride 10 MEQ tablet Commonly known as:  K-DUR Take 2 tablets (20 mEq total) by mouth daily.   prenatal multivitamin Tabs tablet Take 1 tablet by mouth daily at 12 noon.       Vitals:   01/23/18 0106 01/23/18 0436  BP: (!) 163/88 128/74  Pulse: 93  77  Resp: 17 18  Temp: 98.4 F (36.9 C) 98.6 F (37 C)  SpO2: 100% 100%    Skin clean, dry and intact without evidence of skin break down, no evidence of skin tears noted. IV catheter discontinued intact. Site without signs and symptoms of complications. Dressing and pressure applied. Pt denies pain at this time. No complaints noted.  An After Visit Summary was printed and given to the patient. Patient escorted via [place], and D/C home via private auto.  Nonie Hoyer S 01/23/2018 12:07 PM

## 2018-01-23 NOTE — BH Specialist Note (Deleted)
Integrated Behavioral Health Initial Visit  MRN: [id number] Name: Barbara Donovan  Number of [place] Clinician visits:: {IBH Number of Visits:21014052} Session Start time: ***  Session End time: *** Total time: {IBH Total Time:21014050}  Type of Service: [place] Interpretor:{yes [id number]} Interpretor Name and Language: ***   Warm Hand Off Completed.       SUBJECTIVE: Pria Klosinski is a 28 y.o. female accompanied by {CHL AMB ACCOMPANIED FM:[id number]} Patient was referred by *** for ***. Patient reports the following symptoms/concerns: *** Duration of problem: ***; Severity of problem: {Mild/Moderate/Severe:20260}  OBJECTIVE: Mood: {BHH MOOD:22306} and Affect: {BHH AFFECT:22307} Risk of harm to self or others: {CHL AMB BH Suicide Current Mental Status:21022748}  LIFE CONTEXT: Family and Social: *** School/Work: *** Self-Care: *** Life Changes: ***  GOALS ADDRESSED: Patient will: 1. Reduce symptoms of: {IBH Symptoms:21014056} 2. Increase knowledge and/or ability of: {IBH Patient Tools:21014057}  3. Demonstrate ability to: {IBH Goals:21014053}  INTERVENTIONS: Interventions utilized: {IBH Interventions:21014054}  Standardized Assessments completed: {IBH Screening Tools:21014051}  ASSESSMENT: Patient currently experiencing ***.   Patient may benefit from ***.  PLAN: 1. Follow up with behavioral health clinician on : *** 2. Behavioral recommendations: *** 3. Referral(s): {IBH Referrals:21014055} 4. "From scale of 1-10, how likely are you to follow plan?": ***  Caroleen Hamman Briselda Naval, LCSW

## 2018-01-23 NOTE — Discharge Summary (Signed)
Physician Discharge Summary  Illyanna Petillo PIR:[id number] DOB: 11/08/1990 DOA: 01/22/2018  PCP: Patient, No Pcp Per  Admit date: 01/22/2018 Discharge date: 01/23/2018  Admitted From: Home Disposition: Home home Recommendations for Outpatient Follow-up:  1. Follow up with PCP in 1-2 weeks 2. Please obtain BMP/CBC in one week   Home Health: None Equipment/Devices: None  Discharge Condition stable and improved CODE STATUS full code Diet recommendation modified carb Brief/Interim Summary: 28 y.o. female with medical history significant for type 1 diabetes mellitus and hypertension, now presenting to the emergency department for evaluation of nausea and vomiting.  Patient reports several days of upper respiratory symptoms, and then developed nausea with recurrent nonbloody vomiting yesterday.  She has not noted any fevers or chills, upper respiratory symptoms have improved, but she reports severe ongoing nausea with recurrent vomiting and has been unable to keep her oral medications down.  She denies abdominal pain or diarrhea.  ED Course: Upon arrival to the ED, patient is found to be afebrile, saturating well on room air, slightly tachycardic, and hypertensive to 190/100.  EKG features a sinus rhythm with QTc interval 505 ms.  Chest x-ray is negative for acute cardiopulmonary disease.  Chemistry panel is notable for a glucose of 365 with normal bicarbonate and normal anion gap, slight hyponatremia, and potassium 2.4.  CBC features a thrombocytosis with platelets 668,000.  Urinalysis is notable for glucosuria, ketonuria, and proteinuria.  Patient was given a liter of normal saline, 10 mg IV hydralazine, Compazine, Zofran, and IV potassium.  Influenza PCR was ordered but not yet sent.  Blood pressure improved with hydralazine but patient remains unable to tolerate anything by mouth and will be observed for ongoing IV fluid hydration and parenteral electrolyte replacement.   Discharge Diagnoses:   Principal Problem:   Intractable nausea and vomiting Active Problems:   Type I diabetes mellitus (HCC)   Chronic hypertension   Hypertensive urgency   Hypokalemia    #1 intractable nausea and vomiting patient admitted was treated with Zofran Compazine and IV fluids.  Patient did not have any fever abdominal pain.  Her symptoms improved with symptomatic treatment.  She was able to tolerate a regular diet prior to discharge.  #2 hypokalemia repleted she will need follow-up with PCP to check a BMP in 1 week.  #3 hypertension with hypertensive urgency continue home medications.  #4 type 2 diabetes continue Lantus and NovoLog as she was taking at home.                  Estimated body mass index is 26.18 kg/m as calculated from the following:   Height as of this encounter: '5\' 6"'$  (1.676 m).   Weight as of this encounter: 73.6 kg.  Discharge Instructions  Discharge Instructions    Call MD for:  persistant dizziness or light-headedness   Complete by:  As directed    Call MD for:  persistant nausea and vomiting   Complete by:  As directed    Call MD for:  temperature >100.4   Complete by:  As directed    Diet - low sodium heart healthy   Complete by:  As directed    Increase activity slowly   Complete by:  As directed      Allergies as of 01/23/2018   No Known Allergies     Medication List    STOP taking these medications   docusate sodium 100 MG capsule Commonly known as:  COLACE   furosemide 40 MG tablet Commonly  known as:  LASIX   ibuprofen 600 MG tablet Commonly known as:  ADVIL,MOTRIN   potassium chloride SA 20 MEQ tablet Commonly known as:  K-DUR,KLOR-CON     TAKE these medications   B-6 PO Take 2 tablets by mouth daily.   Blood Pressure Cuff Misc 1 kit by Does not apply route daily.   enalapril 20 MG tablet Commonly known as:  VASOTEC Take 1 tablet (20 mg total) by mouth 2 (two) times daily.   famotidine 20 MG tablet Commonly known as:   PEPCID Take 20 mg by mouth daily as needed for heartburn.   ferrous sulfate 325 (65 FE) MG tablet Take 1 tablet (325 mg total) by mouth 2 (two) times daily with a meal.   hydrochlorothiazide 25 MG tablet Commonly known as:  HYDRODIURIL Take 1 tablet (25 mg total) by mouth daily.   insulin aspart 100 UNIT/ML injection Commonly known as:  novoLOG Inject 5 Units into the skin 3 (three) times daily with meals. What changed:  how much to take   insulin glargine 100 UNIT/ML injection Commonly known as:  LANTUS Inject 0.12 mLs (12 Units total) into the skin at bedtime. What changed:  how much to take   oxyCODONE-acetaminophen 5-325 MG tablet Commonly known as:  PERCOCET/ROXICET Take 1-2 tablets by mouth every 4 (four) hours as needed for moderate pain or severe pain.   potassium chloride 10 MEQ tablet Commonly known as:  K-DUR Take 2 tablets (20 mEq total) by mouth daily.   prenatal multivitamin Tabs tablet Take 1 tablet by mouth daily at 12 noon.       No Known Allergies  Consultations:     Procedures/Studies: Dg Chest 2 View  Result Date: 01/22/2018 CLINICAL DATA:  Nausea, vomiting, and cough since yesterday, history diabetes mellitus, hypertension EXAM: CHEST - 2 VIEW COMPARISON:  None FINDINGS: Normal heart size, mediastinal contours, and pulmonary vascularity. Probable LEFT nipple shadow, seen on lateral view as well. Lungs clear. No definite infiltrate, pleural effusion or pneumothorax. Bones unremarkable. IMPRESSION: No acute abnormalities. Electronically Signed   By: Lavonia Dana M.D.   On: 01/22/2018 23:24   [name] Mfm Fetal Bpp Wo Non Stress  Result Date: 12/26/2017 ----------------------------------------------------------------------  OBSTETRICS REPORT                       (Signed Final 12/26/2017 08:25 am) ---------------------------------------------------------------------- Patient Info  ID #:       [id number]                          D.O.B.:  [birth year] (27 yrs)   Name:       Barbara Donovan                  Visit Date: 12/26/2017 07:10 am ---------------------------------------------------------------------- Performed By  Performed By:     Novella Rob        Secondary Phy.:   [facility]  [facility]                                                             [facility]care  Attending:        Pricilla Handler MD       Address:          [facility]                                                             [place]                                                             [place], [place]  Referred By:      [facility]       Location:         [facility] for                    [place]  Ref. Address:     [facility]                    [place], [place]  [address] ---------------------------------------------------------------------- Orders   #  Description                          Code         Ordered By   1  [name] MFM FETAL BPP WO NON              76819.01     Mallory Shirk      STRESS   2  [name] MFM UA CORD DOPPLER               76820.02     Mallory Shirk  ----------------------------------------------------------------------   #  Order #                    Accession #                 Episode #   1  [id number]                  [id number]   2  [id number]                  [id number]                  [id number]  ---------------------------------------------------------------------- Indications   Pre-existing essential  hypertension            E31.540   complicating pregnancy, third trimester   Pre-existing diabetes, type 1, in pregnancy,   O24.013   third trimester   [redacted] weeks gestation of pregnancy                Z3A.35  ---------------------------------------------------------------------- Fetal Evaluation  Num Of Fetuses:         1  Fetal Heart Rate(bpm):  138  Cardiac Activity:       Observed  Presentation:           Cephalic  Amniotic Fluid  AFI FV:      Oligohydramnios  AFI Sum(cm)     %Tile       Largest Pocket(cm)  1.14            < 3         1.14  RUQ(cm)       RLQ(cm)       LUQ(cm)        LLQ(cm)  0             1.14          0              0  Comment:    maternal ascites visualized in all four quadrant ---------------------------------------------------------------------- Biophysical Evaluation  Amniotic F.V:   Oligohydramnios            F. Tone:        Observed  F. Movement:    Observed                   Score:          6/8  F. Breathing:   Observed ---------------------------------------------------------------------- OB History  Gravidity:    1         Term:   0        Prem:   0        SAB:   0  TOP:  0       Ectopic:  0        Living: 0 ---------------------------------------------------------------------- Gestational Age  Best:          35w 5d     Det. ByLoman Chroman         EDD:   01/25/18                                      (05/30/17) ---------------------------------------------------------------------- Anatomy  Thoracic:              Appears normal         Kidneys:                Absent right kidney  Stomach:               Appears normal, left   Bladder:                Appears normal                         sided ---------------------------------------------------------------------- Doppler - Fetal Vessels  Umbilical Artery                                                            ADFV    RDFV                                                              Yes     Yes  Ductus Venosus                                               Reversed ---------------------------------------------------------------------- Cervix Uterus Adnexa  Cervix  Not visualized (advanced GA >24wks) ---------------------------------------------------------------------- Impression  Live late preterm fetus with oligohydramnios and abnormal  umbilical artery and ductus venosus velocimetry. ---------------------------------------------------------------------- Recommendations  Continuous monitoring  Delivery ----------------------------------------------------------------------                 Pricilla Handler, MD Electronically Signed Final Report   12/26/2017 08:25 am ----------------------------------------------------------------------  [name] Mfm Ua Cord Doppler  Result Date: 12/26/2017 ----------------------------------------------------------------------  OBSTETRICS REPORT                       (Signed Final 12/26/2017 08:25 am) ---------------------------------------------------------------------- Patient Info  ID #:       [id number]                          D.O.B.:  Jun 13, 1990 (27 yrs)  Name:       Barbara Donovan                  Visit Date: 12/26/2017 07:10 am ---------------------------------------------------------------------- Performed By  Performed By:     Novella Rob        Secondary Phy.:   [facility]                                                             [facility]                                                             [facility]  Attending:        Pricilla Handler MD       Address:          [facility]                                                             [place]                                                             [place], [place]  Referred By:      [facility]       Location:         [facility] for  Women's                    Healthcare  Ref. Address:     [facility]                    [place]                    [place], [place] ---------------------------------------------------------------------- Orders   #  Description                          Code         Ordered By   1  [name] MFM FETAL BPP WO NON              99357.01     Mallory Shirk      STRESS   2  [name] MFM UA CORD DOPPLER               76820.02     Mallory Shirk  ----------------------------------------------------------------------   #  Order #                    Accession #                 Episode #   1  [id number]                  [id number]                  [id number]   2  [id number]                  [id number]                  [id number]  ---------------------------------------------------------------------- Indications   Pre-existing essential hypertension            B63.845   complicating pregnancy, third trimester   Pre-existing diabetes, type 1, in pregnancy,   O24.013   third trimester   [redacted] weeks gestation of pregnancy                Z3A.35  ---------------------------------------------------------------------- Fetal Evaluation  Num Of Fetuses:         1  Fetal Heart Rate(bpm):  138  Cardiac Activity:       Observed  Presentation:           Cephalic  Amniotic Fluid  AFI FV:      Oligohydramnios  AFI Sum(cm)     %Tile       Largest Pocket(cm)  1.14            < 3         1.14  RUQ(cm)       RLQ(cm)       LUQ(cm)        LLQ(cm)  0             1.14          0              0  Comment:  maternal ascites visualized in all four quadrant ---------------------------------------------------------------------- Biophysical Evaluation  Amniotic F.V:   Oligohydramnios            F. Tone:        Observed  F. Movement:     Observed                   Score:          6/8  F. Breathing:   Observed ---------------------------------------------------------------------- OB History  Gravidity:    1         Term:   0        Prem:   0        SAB:   0  TOP:          0       Ectopic:  0        Living: 0 ---------------------------------------------------------------------- Gestational Age  Best:          35w 5d     Det. ByLoman Chroman         EDD:   01/25/18                                      (05/30/17) ---------------------------------------------------------------------- Anatomy  Thoracic:              Appears normal         Kidneys:                Absent right kidney  Stomach:               Appears normal, left   Bladder:                Appears normal                         sided ---------------------------------------------------------------------- Doppler - Fetal Vessels  Umbilical Artery                                                            ADFV    RDFV                                                              Yes     Yes  Ductus Venosus                                              Reversed ---------------------------------------------------------------------- Cervix Uterus Adnexa  Cervix  Not visualized (advanced GA >24wks) ---------------------------------------------------------------------- Impression  Live late preterm fetus with oligohydramnios and abnormal  umbilical artery and ductus venosus velocimetry. ---------------------------------------------------------------------- Recommendations  Continuous monitoring  Delivery ----------------------------------------------------------------------                 Pricilla Handler, MD Electronically Signed Final Report   12/26/2017 08:25 am ----------------------------------------------------------------------   (Echo, Carotid, EGD, Colonoscopy, ERCP)  Subjective:   Discharge Exam: Vitals:   01/23/18 0106 01/23/18 0436  BP: (!) 163/88 128/74  Pulse: 93 77   Resp: 17 18  Temp: 98.4 F (36.9 C) 98.6 F (37 C)  SpO2: 100% 100%   Vitals:   01/23/18 0000 01/23/18 0036 01/23/18 0106 01/23/18 0436  BP: (!) 172/94 (!) 166/92 (!) 163/88 128/74  Pulse: (!) 109 99 93 77  Resp: '19 16 17 18  '$ Temp:   98.4 F (36.9 C) 98.6 F (37 C)  TempSrc:   Oral Oral  SpO2: 100% 100% 100% 100%  Weight:    73.6 kg  Height:        General: Pt is alert, awake, not in acute distress Cardiovascular: RRR, S1/S2 +, no rubs, no gallops Respiratory: CTA bilaterally, no wheezing, no rhonchi Abdominal: Soft, NT, ND, bowel sounds + Extremities: no edema, no cyanosis    The results of significant diagnostics from this hospitalization (including imaging, microbiology, ancillary and laboratory) are listed below for reference.     Microbiology: No results found for this or any previous visit (from the past 240 hour(s)).   Labs: BNP (last 3 results) No results for input(s): BNP in the last 8760 hours. Basic Metabolic Panel: Recent Labs  Lab 01/22/18 2106 01/23/18 0543  NA 134* 139  K 2.4* 2.4*  CL 97* 107  CO2 23 23  GLUCOSE 365* 209*  BUN 9 7  CREATININE 0.91 0.88  CALCIUM 8.7* 7.9*  MG  --  2.2   Liver Function Tests: Recent Labs  Lab 01/22/18 2106  AST 22  ALT 18  ALKPHOS 55  BILITOT 0.9  PROT 6.5  ALBUMIN 2.4*   Recent Labs  Lab 01/22/18 2106  LIPASE 27   No results for input(s): AMMONIA in the last 168 hours. CBC: Recent Labs  Lab 01/22/18 2106 01/23/18 0543  WBC 8.3 7.4  NEUTROABS  --  4.5  HGB 11.8* 10.5*  HCT 36.3 33.8*  MCV 85.4 85.8  PLT 668* 634*   Cardiac Enzymes: No results for input(s): CKTOTAL, CKMB, CKMBINDEX, TROPONINI in the last 168 hours. BNP: Invalid input(s): POCBNP CBG: Recent Labs  Lab 01/23/18 0023 01/23/18 0440 01/23/18 0725  GLUCAP 332* 226* 157*   D-Dimer No results for input(s): DDIMER in the last 72 hours. Hgb A1c No results for input(s): HGBA1C in the last 72 hours. Lipid Profile No  results for input(s): CHOL, HDL, LDLCALC, TRIG, CHOLHDL, LDLDIRECT in the last 72 hours. Thyroid function studies No results for input(s): TSH, T4TOTAL, T3FREE, THYROIDAB in the last 72 hours.  Invalid input(s): FREET3 Anemia work up No results for input(s): VITAMINB12, FOLATE, FERRITIN, TIBC, IRON, RETICCTPCT in the last 72 hours. Urinalysis    Component Value Date/Time   COLORURINE YELLOW 01/22/2018 2106   APPEARANCEUR CLEAR 01/22/2018 2106   LABSPEC 1.016 01/22/2018 2106   PHURINE 7.0 01/22/2018 2106   GLUCOSEU >=500 (A) 01/22/2018 2106   HGBUR SMALL (A) 01/22/2018 2106   BILIRUBINUR NEGATIVE 01/22/2018 2106   KETONESUR 80 (A) 01/22/2018 2106   PROTEINUR >=300 (A) 01/22/2018 2106   UROBILINOGEN 1.0 12/22/2017 1329   NITRITE NEGATIVE 01/22/2018 2106   LEUKOCYTESUR NEGATIVE 01/22/2018 2106   Sepsis Labs Invalid input(s): PROCALCITONIN,  WBC,  LACTICIDVEN Microbiology No results found for this or any previous visit (from the past 240 hour(s)).   Time coordinating discharge: 34  minutes  SIGNED:   Georgette Shell, MD  [facility] 01/23/2018, 10:06 AM Pager   If 7PM-7AM, please  contact night-coverage [email or web address] Password [email or web address]

## 2018-01-23 NOTE — ED Notes (Signed)
ED TO INPATIENT HANDOFF REPORT  Name/Age/Gender Barbara Donovan 28 y.o. female  Code Status    Code Status Orders  (From admission, onward)         Start     Ordered   01/22/18 2338  Full code  Continuous     01/22/18 2340        Code Status History    Date Active Date Inactive Code Status Order ID Comments User Context   12/26/2017 0949 12/26/2017 1950 Full Code [id number]  Osborne Oman, MD Inpatient   12/22/2017 1927 12/26/2017 0949 Full Code [id number]  Lavonia Drafts, MD Inpatient   11/02/2017 0155 11/04/2017 1957 Full Code [id number]  Osborne Oman, MD Inpatient   10/29/2017 2014 10/31/2017 1500 Full Code [id number]  [name], MD Inpatient      Home/SNF/Other Home  Chief Complaint Vomiting and Nausea   Level of Care/Admitting Diagnosis ED Disposition    ED Disposition Condition [place]: [place] [100102]  Level of Care: Telemetry [5]  Admit to tele based on following criteria: Monitor QTC interval  Diagnosis: Intractable nausea and vomiting [536644]  Admitting Physician: Vianne Bulls [[id number]]  Attending Physician: Vianne Bulls [[id number]]  PT Class (Do Not Modify): Observation [104]  PT Acc Code (Do Not Modify): Observation [10022]       Medical History Past Medical History:  Diagnosis Date  . Diabetes ([place])    TYPE I  . Hypertension complicating pregnancy     Allergies No Known Allergies  IV Location/Drains/Wounds Patient Lines/Drains/Airways Status   Active Line/Drains/Airways    Name:   Placement date:   Placement time:   Site:   Days:   Peripheral IV 01/22/18 Left Antecubital   01/22/18    2212    Antecubital   1   Incision (Closed) 12/26/17 Abdomen Other (Comment)   12/26/17    1655     28   Incision (Closed) 12/26/17 Vagina Other (Comment)   12/26/17    1655     28   Wound / Incision (Open or Dehisced) 12/22/17 Leg Left;Right;Anterior;Mid;Bilateral several sores on legs,  one was weeping clear liquid   12/22/17    1835    Leg   32          Labs/Imaging Results for orders placed or performed during the hospital encounter of 01/22/18 (from the past 48 hour(s))  Lipase, blood     Status: None   Collection Time: 01/22/18  9:06 PM  Result Value Ref Range   Lipase 27 11 - 51 U/L    Comment: Performed at [facility], [place] [address], [place], [place] [address]  Comprehensive metabolic panel     Status: Abnormal   Collection Time: 01/22/18  9:06 PM  Result Value Ref Range   Sodium 134 (L) 135 - 145 mmol/L   Potassium 2.4 (LL) 3.5 - 5.1 mmol/L    Comment: CRITICAL RESULT CALLED TO, READ BACK BY AND VERIFIED WITH: Kayton Ripp AT 2302 ON 01/22/2018 BY MOSLEY,J     Chloride 97 (L) 98 - 111 mmol/L   CO2 23 22 - 32 mmol/L   Glucose, Bld 365 (H) 70 - 99 mg/dL   BUN 9 6 - 20 mg/dL   Creatinine, Ser 0.91 0.44 - 1.00 mg/dL   Calcium 8.7 (L) 8.9 - 10.3 mg/dL   Total Protein 6.5 6.5 - 8.1 g/dL   Albumin 2.4 (L) 3.5 - 5.0 g/dL  AST 22 15 - 41 U/L   ALT 18 0 - 44 U/L   Alkaline Phosphatase 55 38 - 126 U/L   Total Bilirubin 0.9 0.3 - 1.2 mg/dL   GFR calc non Af Amer >60 >60 mL/min   GFR calc Af Amer >60 >60 mL/min   Anion gap 14 5 - 15    Comment: Performed at [facility], [place] [address], [place], [place] [address]  CBC     Status: Abnormal   Collection Time: 01/22/18  9:06 PM  Result Value Ref Range   WBC 8.3 4.0 - 10.5 K/uL   RBC 4.25 3.87 - 5.11 MIL/uL   Hemoglobin 11.8 (L) 12.0 - 15.0 g/dL   HCT 36.3 36.0 - 46.0 %   MCV 85.4 80.0 - 100.0 fL   MCH 27.8 26.0 - 34.0 pg   MCHC 32.5 30.0 - 36.0 g/dL   RDW 13.7 11.5 - 15.5 %   Platelets 668 (H) 150 - 400 K/uL   nRBC 0.0 0.0 - 0.2 %    Comment: Performed at [facility], [place] [address], [place], [place] [address]  Urinalysis, Routine w reflex microscopic     Status: Abnormal   Collection Time: 01/22/18  9:06 PM  Result Value Ref Range   Color, Urine  YELLOW YELLOW   APPearance CLEAR CLEAR   Specific Gravity, Urine 1.016 1.005 - 1.030   pH 7.0 5.0 - 8.0   Glucose, UA >=500 (A) NEGATIVE mg/dL   Hgb urine dipstick SMALL (A) NEGATIVE   Bilirubin Urine NEGATIVE NEGATIVE   Ketones, ur 80 (A) NEGATIVE mg/dL   Protein, ur >=300 (A) NEGATIVE mg/dL   Nitrite NEGATIVE NEGATIVE   Leukocytes, UA NEGATIVE NEGATIVE   RBC / HPF 0-5 0 - 5 RBC/hpf   WBC, UA 0-5 0 - 5 WBC/hpf   Bacteria, UA NONE SEEN NONE SEEN   Squamous Epithelial / LPF 0-5 0 - 5   Mucus PRESENT    Hyaline Casts, UA PRESENT    Granular Casts, UA PRESENT     Comment: Performed at [facility], [place] [address], [place], [place] [address]  I-Stat beta hCG blood, ED     Status: None   Collection Time: 01/22/18 10:17 PM  Result Value Ref Range   I-stat hCG, quantitative <5.0 <5 mIU/mL   Comment 3            Comment:   GEST. AGE      CONC.  (mIU/mL)   <=1 WEEK        5 - 50     2 WEEKS       50 - 500     3 WEEKS       100 - 10,000     4 WEEKS     1,000 - 30,000        FEMALE AND NON-PREGNANT FEMALE:     LESS THAN 5 mIU/mL   Blood gas, venous     Status: Abnormal   Collection Time: 01/22/18 10:30 PM  Result Value Ref Range   O2 Content ROOM AIR L/min   Delivery systems ROOM AIR    pH, Ven 7.531 (H) 7.250 - 7.430   pCO2, Ven 31.4 (L) 44.0 - 60.0 mmHg   pO2, Ven VALUE BELOW REPORTABLE RANGE 32.0 - 45.0 mmHg    Comment: CRITICAL RESULT CALLED TO, READ BACK BY AND VERIFIED WITH: DR Vanita Panda AT 2238 BY ANNALISSA AGUSTIN,RRT,RCP ON 01/22/18  Bicarbonate 26.1 20.0 - 28.0 mmol/L   Acid-Base Excess 4.6 (H) 0.0 - 2.0 mmol/L   O2 Saturation 52.0 %   Patient temperature 98.6    Collection site VEIN    Drawn by COLLECTED BY NURSE    Sample type VENOUS     Comment: Performed at [facility], [place] [address] [place], [place] [address]   Dg Chest 2 View  Result Date: 01/22/2018 CLINICAL DATA:  Nausea, vomiting, and cough since yesterday, history  diabetes mellitus, hypertension EXAM: CHEST - 2 VIEW COMPARISON:  None FINDINGS: Normal heart size, mediastinal contours, and pulmonary vascularity. Probable LEFT nipple shadow, seen on lateral view as well. Lungs clear. No definite infiltrate, pleural effusion or pneumothorax. Bones unremarkable. IMPRESSION: No acute abnormalities. Electronically Signed   By: Lavonia Dana M.D.   On: 01/22/2018 23:24   None  Pending Labs Unresulted Labs (From admission, onward)    Start     Ordered   01/29/18 0500  Creatinine, serum  (enoxaparin (LOVENOX)    CrCl >/= 30 ml/min)  Weekly,   R    Comments:  while on enoxaparin therapy    01/22/18 2340   01/23/18 0500  HIV antibody (Routine Testing)  Tomorrow morning,   R     01/22/18 2340   01/23/18 0177  Basic metabolic panel  Tomorrow morning,   R     01/22/18 2340   01/23/18 0500  Magnesium  Tomorrow morning,   R     01/22/18 2340   01/23/18 0500  CBC WITH DIFFERENTIAL  Tomorrow morning,   R     01/22/18 2340   01/22/18 2242  Influenza panel by PCR (type A & B)  (Influenza PCR Panel)  Once,   R     01/22/18 2242          Vitals/Pain Today's Vitals   01/22/18 2101 01/22/18 2102 01/22/18 2229 01/22/18 2338  BP: (!) 190/115  (!) 190/103 (!) 192/110  Pulse: (!) 117  92 87  Resp: (!) 24  14 18   Temp: 97.8 F (36.6 C)     TempSrc: Oral     SpO2: 100%  100% 100%  Weight:  84.9 kg    Height:  5\' 6"  (1.676 m)    PainSc: 8  8       Isolation Precautions Droplet precaution  Medications Medications  0.9 % NaCl with KCl 20 mEq/ L  infusion ( Intravenous New Bag/Given 01/22/18 2347)  insulin glargine (LANTUS) injection 12 Units (has no administration in time range)  insulin aspart (novoLOG) injection 0-9 Units (has no administration in time range)  enoxaparin (LOVENOX) injection 40 mg (has no administration in time range)  sodium chloride flush (NS) 0.9 % injection 3 mL (has no administration in time range)  0.9 % NaCl with KCl 40 mEq / L  infusion  (has no administration in time range)  acetaminophen (TYLENOL) tablet 650 mg (has no administration in time range)    Or  acetaminophen (TYLENOL) suppository 650 mg (has no administration in time range)  ondansetron (ZOFRAN) tablet 4 mg (has no administration in time range)    Or  ondansetron (ZOFRAN) injection 4 mg (has no administration in time range)  promethazine (PHENERGAN) injection 12.5-25 mg (has no administration in time range)  magnesium sulfate IVPB 2 g 50 mL (has no administration in time range)  potassium chloride 10 mEq in 100 mL IVPB (has no administration in time range)  hydrALAZINE (APRESOLINE) injection 10  mg (has no administration in time range)  sodium chloride 0.9 % bolus 1,000 mL (1,000 mLs Intravenous New Bag/Given 01/22/18 2238)  ondansetron (ZOFRAN) injection 4 mg (4 mg Intravenous Given 01/22/18 2239)  hydrALAZINE (APRESOLINE) injection 10 mg (10 mg Intravenous Given 01/22/18 2344)  prochlorperazine (COMPAZINE) injection 10 mg (10 mg Intravenous Given 01/22/18 2344)    Mobility walks

## 2018-01-23 NOTE — Progress Notes (Signed)
CRITICAL VALUE ALERT  Critical Value: K+  Date & Time Notied:  01/23/2018   Provider Notified: On Call [place]   Orders Received/Actions taken:

## 2018-01-26 ENCOUNTER — Encounter (HOSPITAL_COMMUNITY): Payer: Self-pay

## 2018-01-26 ENCOUNTER — Emergency Department (HOSPITAL_COMMUNITY)
Admission: EM | Admit: 2018-01-26 | Discharge: 2018-01-26 | Disposition: A | Discharge status: Home / Self Care | Payer: BC Managed Care – PPO | Attending: Emergency Medicine | Admitting: Emergency Medicine

## 2018-01-26 ENCOUNTER — Other Ambulatory Visit: Payer: Self-pay

## 2018-01-26 DIAGNOSIS — Z79899 Other long term (current) drug therapy: Secondary | ICD-10-CM | POA: Diagnosis not present

## 2018-01-26 DIAGNOSIS — O99285 Endocrine, nutritional and metabolic diseases complicating the puerperium: Secondary | ICD-10-CM | POA: Insufficient documentation

## 2018-01-26 DIAGNOSIS — R1110 Vomiting, unspecified: Secondary | ICD-10-CM | POA: Diagnosis present

## 2018-01-26 DIAGNOSIS — R112 Nausea with vomiting, unspecified: Secondary | ICD-10-CM | POA: Diagnosis not present

## 2018-01-26 DIAGNOSIS — E876 Hypokalemia: Secondary | ICD-10-CM | POA: Diagnosis not present

## 2018-01-26 DIAGNOSIS — O1003 Pre-existing essential hypertension complicating the puerperium: Secondary | ICD-10-CM | POA: Diagnosis not present

## 2018-01-26 DIAGNOSIS — E1065 Type 1 diabetes mellitus with hyperglycemia: Secondary | ICD-10-CM | POA: Diagnosis not present

## 2018-01-26 DIAGNOSIS — O2403 Pre-existing diabetes mellitus, type 1, in the puerperium: Secondary | ICD-10-CM | POA: Insufficient documentation

## 2018-01-26 DIAGNOSIS — R739 Hyperglycemia, unspecified: Secondary | ICD-10-CM

## 2018-01-26 LAB — LIPASE, BLOOD: Lipase: 29 U/L (ref 11–51)

## 2018-01-26 LAB — BLOOD GAS, VENOUS
Acid-Base Excess: 3.4 mmol/L — ABNORMAL HIGH (ref 0.0–2.0)
Bicarbonate: 27.5 mmol/L (ref 20.0–28.0)
O2 Saturation: 85.8 %
Patient temperature: 98.6
pCO2, Ven: 41.8 mmHg — ABNORMAL LOW (ref 44.0–60.0)
pH, Ven: 7.434 — ABNORMAL HIGH (ref 7.250–7.430)
pO2, Ven: 55.5 mmHg — ABNORMAL HIGH (ref 32.0–45.0)

## 2018-01-26 LAB — COMPREHENSIVE METABOLIC PANEL
ALK PHOS: 53 U/L (ref 38–126)
ALT: 16 U/L (ref 0–44)
AST: 17 U/L (ref 15–41)
Albumin: 2.5 g/dL — ABNORMAL LOW (ref 3.5–5.0)
Anion gap: 15 (ref 5–15)
BUN: 9 mg/dL (ref 6–20)
CALCIUM: 8.5 mg/dL — AB (ref 8.9–10.3)
CO2: 23 mmol/L (ref 22–32)
Chloride: 94 mmol/L — ABNORMAL LOW (ref 98–111)
Creatinine, Ser: 0.92 mg/dL (ref 0.44–1.00)
GFR calc Af Amer: >60 mL/min (ref >60)
GFR calc non Af Amer: >60 mL/min (ref >60)
Glucose, Bld: 453 mg/dL — ABNORMAL HIGH (ref 70–99)
Potassium: 2.4 mmol/L — CL (ref 3.5–5.1)
Sodium: 132 mmol/L — ABNORMAL LOW (ref 135–145)
Total Bilirubin: 0.7 mg/dL (ref 0.3–1.2)
Total Protein: 6.6 g/dL (ref 6.5–8.1)

## 2018-01-26 LAB — CBC
HCT: 37.7 % (ref 36.0–46.0)
Hemoglobin: 12.1 g/dL (ref 12.0–15.0)
MCH: 26.8 pg (ref 26.0–34.0)
MCHC: 32.1 g/dL (ref 30.0–36.0)
MCV: 83.6 fL (ref 80.0–100.0)
PLATELETS: 700 10*3/uL — AB (ref 150–400)
RBC: 4.51 MIL/uL (ref 3.87–5.11)
RDW: 13.4 % (ref 11.5–15.5)
WBC: 6.8 10*3/uL (ref 4.0–10.5)
nRBC: 0 % (ref 0.0–0.2)

## 2018-01-26 LAB — URINALYSIS, ROUTINE W REFLEX MICROSCOPIC
Bilirubin Urine: NEGATIVE
Glucose, UA: >=500 mg/dL — AB
Ketones, ur: 20 mg/dL — AB
Leukocytes, UA: NEGATIVE
Nitrite: NEGATIVE
Protein, ur: >=300 mg/dL — AB
Specific Gravity, Urine: 1.016 (ref 1.005–1.030)
pH: 7 (ref 5.0–8.0)

## 2018-01-26 LAB — I-STAT BETA HCG BLOOD, ED (MC, WL, AP ONLY)

## 2018-01-26 LAB — CBG MONITORING, ED: Glucose-Capillary: 438 mg/dL — ABNORMAL HIGH (ref 70–99)

## 2018-01-26 MED ORDER — PROMETHAZINE HCL 25 MG/ML IJ SOLN
25.0000 mg | Freq: Once | INTRAMUSCULAR | Status: AC
  Administered 2018-01-26: 25 mg via INTRAVENOUS
  Filled 2018-01-26: qty 1

## 2018-01-26 MED ORDER — POTASSIUM CHLORIDE CRYS ER 20 MEQ PO TBCR
40.0000 meq | EXTENDED_RELEASE_TABLET | Freq: Once | ORAL | Status: AC
  Administered 2018-01-26: 40 meq via ORAL
  Filled 2018-01-26: qty 2

## 2018-01-26 MED ORDER — MAGNESIUM SULFATE 2 GM/50ML IV SOLN
2.0000 g | Freq: Once | INTRAVENOUS | Status: AC
  Administered 2018-01-26: 2 g via INTRAVENOUS
  Filled 2018-01-26: qty 50

## 2018-01-26 MED ORDER — SODIUM CHLORIDE 0.9 % IV BOLUS
1000.0000 mL | Freq: Once | INTRAVENOUS | Status: AC
  Administered 2018-01-26: 1000 mL via INTRAVENOUS

## 2018-01-26 MED ORDER — ENALAPRIL MALEATE 10 MG PO TABS
20.0000 mg | ORAL_TABLET | Freq: Once | ORAL | Status: AC
  Administered 2018-01-26: 20 mg via ORAL
  Filled 2018-01-26: qty 2

## 2018-01-26 MED ORDER — POTASSIUM CHLORIDE 10 MEQ/100ML IV SOLN
10.0000 meq | INTRAVENOUS | Status: AC
  Administered 2018-01-26 (×2): 10 meq via INTRAVENOUS
  Filled 2018-01-26 (×2): qty 100

## 2018-01-26 NOTE — ED Triage Notes (Addendum)
Patient states she was diagnosed with hyperemesis when she was pregnant. Patient states she delivered on 12-917. patient states vomiting started again 2 days ago.  Patient states she took a zofran in the parking lot prior to coming to the ED and does not know if she threw the pill up. Patient vomiting in triage. Patient is diabetic.  CBG-438 in triage. Patient states she hs not been compliant with insulin due to stress.

## 2018-01-26 NOTE — ED Notes (Signed)
Bed: WA18 Expected date:  Expected time:  Means of arrival:  Comments: Hold for triage 2 

## 2018-01-26 NOTE — ED Notes (Signed)
Vitals validated with SpO2 of 89%. Data not accurate. Pulse ox probe adjusted on patient finger and SpO2 is 100% on room air.

## 2018-01-26 NOTE — ED Provider Notes (Signed)
[facility] DEPT Provider Note   CSN: [id number] Arrival date & time: 01/26/18  1549     History   Chief Complaint Chief Complaint  Patient presents with  . Emesis  . Hyperglycemia    HPI Barbara Donovan is a 28 y.o. female.  The history is provided by the patient and medical records. No language interpreter was used.  Emesis  Associated symptoms: no abdominal pain and no diarrhea   Hyperglycemia  Associated symptoms: nausea and vomiting   Associated symptoms: no abdominal pain    Barbara Donovan is a 28 y.o. female  with a PMH of type 1 DM, HTN who presents to the Emergency Department complaining of persistent nausea and vomiting.  Patient states that she had hyperemesis during her pregnancy.  She delivered on 12/09 and was feeling quite well for the first couple of weeks.  On 5 January, she started developing nausea and vomiting again.  She was seen in the emergency department and ultimately admitted for hypertension, hypokalemia and persistent vomiting.  Her symptoms improved during admission with symptomatic management.  She was discharged home with potassium supplementation which she states that she has been taking as well as Zofran.  She states that she was feeling better when discharged, however this morning, she began throwing up again.  She denies any abdominal pain, just nauseous and vomiting.  She states that her child is in the NICU, therefore she has not been taking very good care of herself and has been very stressed.  She is concerned stress may be a contributing factor today.  She has not taken her insulin or blood pressure medications yesterday or today.  She denies any chest pain or shortness of breath.  She took a Zofran while in the parking lot which was an ODT tab.  She states that she threw up shortly after taking the medication, therefore not sure it will work.  Denies any fever or chills.  No urinary symptoms, vaginal discharge or back  pain.  Past Medical History:  Diagnosis Date  . Diabetes ([place])    TYPE I  . Hypertension complicating pregnancy     Patient Active Problem List   Diagnosis Date Noted  . Intractable nausea and vomiting 01/22/2018  . Hypertensive urgency 01/22/2018  . Hypokalemia 01/22/2018  . 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 Diabetes mellitus complicating pregnancy, antepartum 12/22/2017  . Intrauterine growth restriction (IUGR) affecting care of mother 12/22/2017  . 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 Pregnancy complicated by multiple fetal congenital anomalies 10/30/2017  . Ventricular septal defect (VSD) of fetus in singleton pregnancy, antepartum 09/30/2017  . Chronic hypertension 08/21/2017  . Depression during pregnancy, antepartum 07/07/2017  . Supervision of high risk pregnancy, antepartum 07/07/2017  . Type I diabetes mellitus ([place]) 07/07/2017  . Chronic hypertension affecting pregnancy 07/07/2017  . Diabetic retinopathy ([place]) 06/09/2017  . Acute depression 11/02/2014  . Asthma 10/30/2013    Past Surgical History:  Procedure Laterality Date  . CESAREAN SECTION N/A 12/26/2017   Procedure: CESAREAN SECTION;  Surgeon: Osborne Oman, MD;  Location: [facility];  Service: Obstetrics;  Laterality: N/A;  . NO PAST SURGERIES    . WISDOM TOOTH EXTRACTION       OB History    Gravida  1   Para  1   Term      Preterm  1   AB      Living  1  SAB      TAB      Ectopic      Multiple  0   Live Births  1            Home Medications    Prior to Admission medications   Medication Sig Start Date End Date Taking? Authorizing Provider  enalapril (VASOTEC) 20 MG tablet Take 1 tablet (20 mg total) by mouth 2 (two) times daily. 12/29/17  Yes Anyanwu, Sallyanne Havers, MD  ferrous sulfate 325 (65 FE) MG tablet Take 1 tablet (325 mg total) by mouth 2 (two) times daily with a meal. 12/29/17  Yes Anyanwu, Sallyanne Havers, MD  hydrochlorothiazide  (HYDRODIURIL) 25 MG tablet Take 1 tablet (25 mg total) by mouth daily. 01/12/18  Yes Starr Lake, CNM  insulin aspart (NOVOLOG) 100 UNIT/ML injection Inject 5 Units into the skin 3 (three) times daily with meals. Patient taking differently: Inject 7 Units into the skin 3 (three) times daily with meals.  12/29/17  Yes Anyanwu, Sallyanne Havers, MD  insulin glargine (LANTUS) 100 UNIT/ML injection Inject 0.12 mLs (12 Units total) into the skin at bedtime. Patient taking differently: Inject 25 Units into the skin at bedtime.  12/29/17  Yes Anyanwu, Sallyanne Havers, MD  oxyCODONE-acetaminophen (PERCOCET/ROXICET) 5-325 MG tablet Take 1-2 tablets by mouth every 4 (four) hours as needed for moderate pain or severe pain. 12/29/17  Yes Anyanwu, Sallyanne Havers, MD  potassium chloride (K-DUR) 10 MEQ tablet Take 2 tablets (20 mEq total) by mouth daily. 01/23/18  Yes Georgette Shell, MD  Prenatal Vit-Fe Fumarate-FA (PRENATAL MULTIVITAMIN) TABS tablet Take 1 tablet by mouth daily at 12 noon.   Yes [provider]  Pyridoxine HCl (B-6 PO) Take 2 tablets by mouth daily.    Yes [provider]  Blood Pressure Monitoring (BLOOD PRESSURE CUFF) MISC 1 kit by Does not apply route daily. 11/14/17   Aura Camps, MD  famotidine (PEPCID) 20 MG tablet Take 20 mg by mouth daily as needed for heartburn.  10/15/17   [provider]    Family History Family History  Problem Relation Age of Onset  . Diabetes Mother   . Diabetes Father   . Cataracts Father   . Amblyopia Neg Hx   . Blindness Neg Hx   . Glaucoma Neg Hx   . Macular degeneration Neg Hx   . Retinal detachment Neg Hx   . Strabismus Neg Hx   . Retinitis pigmentosa Neg Hx     Social History Social History   Tobacco Use  . Smoking status: Never Smoker  . Smokeless tobacco: Never Used  Substance Use Topics  . Alcohol use: Not Currently  . Drug use: Never     Allergies   Patient has no known allergies.   Review of  Systems Review of Systems  Gastrointestinal: Positive for nausea and vomiting. Negative for abdominal pain, blood in stool, constipation and diarrhea.  All other systems reviewed and are negative.    Physical Exam Updated Vital Signs BP (!) 171/97 (BP Location: Right Arm)   Pulse 93   Temp 98.5 F (36.9 C) (Oral)   Resp 15   Ht 5' 6"  (1.676 m)   LMP 04/02/2017 (Approximate)   SpO2 100%   BMI 26.18 kg/m   Physical Exam Vitals signs and nursing note reviewed.  Constitutional:      General: She is not in acute distress.    Appearance: She is well-developed.  HENT:  Head: Normocephalic and atraumatic.  Cardiovascular:     Heart sounds: Normal heart sounds. No murmur.     Comments: Tachycardic, but regular. Pulmonary:     Effort: Pulmonary effort is normal. No respiratory distress.     Breath sounds: Normal breath sounds.  Abdominal:     General: There is no distension.     Palpations: Abdomen is soft.     Comments: No abdominal tenderness.  Skin:    General: Skin is warm and dry.  Neurological:     Mental Status: She is alert and oriented to person, place, and time.      ED Treatments / Results  Labs (all labs ordered are listed, but only abnormal results are displayed) Labs Reviewed  COMPREHENSIVE METABOLIC PANEL - Abnormal; Notable for the following components:      Result Value   Sodium 132 (*)    Potassium 2.4 (*)    Chloride 94 (*)    Glucose, Bld 453 (*)    Calcium 8.5 (*)    Albumin 2.5 (*)    All other components within normal limits  CBC - Abnormal; Notable for the following components:   Platelets 700 (*)    All other components within normal limits  URINALYSIS, ROUTINE W REFLEX MICROSCOPIC - Abnormal; Notable for the following components:   Glucose, UA >=500 (*)    Hgb urine dipstick SMALL (*)    Ketones, ur 20 (*)    Protein, ur >=300 (*)    Bacteria, UA RARE (*)    All other components within normal limits  BLOOD GAS, VENOUS - Abnormal;  Notable for the following components:   pH, Ven 7.434 (*)    pCO2, Ven 41.8 (*)    pO2, Ven 55.5 (*)    Acid-Base Excess 3.4 (*)    All other components within normal limits  CBG MONITORING, ED - Abnormal; Notable for the following components:   Glucose-Capillary 438 (*)    All other components within normal limits  LIPASE, BLOOD  I-STAT BETA HCG BLOOD, ED ([facility], [facility], [facility] ONLY)  CBG MONITORING, ED  I-STAT BETA HCG BLOOD, ED ([facility], [facility], [facility] ONLY)    EKG EKG Interpretation  Date/Time:  Thursday January 26 2018 19:35:27 EST Ventricular Rate:  102 PR Interval:    QRS Duration: 99 QT Interval:  406 QTC Calculation: 529 R Axis:   69 Text Interpretation:  Sinus tachycardia Prolonged QT interval No significant change since last tracing Confirmed by Dorie Rank [phone]) on 01/26/2018 7:41:50 PM   Radiology No results found.  Procedures Procedures (including critical care time)  Medications Ordered in ED Medications  promethazine (PHENERGAN) injection 25 mg (25 mg Intravenous Given 01/26/18 1849)  sodium chloride 0.9 % bolus 1,000 mL (0 mLs Intravenous Stopped 01/26/18 1949)  potassium chloride SA (K-DUR,KLOR-CON) CR tablet 40 mEq (40 mEq Oral Given 01/26/18 2028)  potassium chloride 10 mEq in 100 mL IVPB (0 mEq Intravenous Stopped 01/26/18 2307)  magnesium sulfate IVPB 2 g 50 mL (0 g Intravenous Stopped 01/26/18 2127)  sodium chloride 0.9 % bolus 1,000 mL (0 mLs Intravenous Stopped 01/26/18 2234)  enalapril (VASOTEC) tablet 20 mg (20 mg Oral Given 01/26/18 2204)     Initial Impression / Assessment and Plan / ED Course  I have reviewed the triage vital signs and the nursing notes.  Pertinent labs & imaging results that were available during my care of the patient were reviewed by me and considered in my medical decision making (see chart  for details).    Barbara Donovan is a 28 y.o. female who presents to ED for nausea and vomiting.  Seen on 1/5 for same where she was admitted to the hospital given  hypokalemia, persistent hypertension and inability to tolerate p.o.  Chart reviewed from this encounter.  She was discharged home on 20 mEq of potassium daily which she reports that she has been compliant with.  She has not been compliant with her insulin however.  On exam, she was initially hypertensive, however has not had her blood pressure medication today.  After nausea medication was given to control her emesis, p.o. blood pressure medication was given and BP responding appropriately.  She is hyperglycemic, but does not appear to be in DKA.  VBG without signs of acidosis.  No abdominal tenderness on exam.  Her lab work does show hypokalemia at 2.4 which is the same value as when she was admitted a few days ago.  Given potassium and magnesium in the emergency department today. On re-evaluation, she is feeling better. Nausea controlled. Has drank 3 cups of water without emesis. Evaluation does not show pathology that would require ongoing emergent intervention or inpatient treatment. Reasons to return to ER were discussed. She will call her doctor in the am to schedule follow up appointment. Discussed importance of medication compliance. All questions answered.  Patient discussed with Dr. Tomi Bamberger who agrees with treatment plan.    Final Clinical Impressions(s) / ED Diagnoses   Final diagnoses:  Hyperglycemia  Non-intractable vomiting with nausea, unspecified vomiting type  Hypokalemia    ED Discharge Orders    None       , Ozella Almond, PA-C 01/26/18 2353    Dorie Rank, MD 01/30/18 [phone]

## 2018-01-26 NOTE — Discharge Instructions (Addendum)
It was my pleasure taking care of you today!   Call your OBGYN in the morning to schedule a follow up appointment.   It is very important that you take all your medications as directed. You should be taking your blood pressure medication and potassium pills daily. Take your insulin as prescribed.   Return to ER for fever, persistent vomiting (unable to keep fluids down), new or worsening symptoms, any additional concerns.

## 2018-01-26 NOTE — ED Notes (Signed)
This RN attempted x 2 to start IV.RN [place] attempted to start IV. IV team consult placed for this patient.

## 2018-01-28 ENCOUNTER — Other Ambulatory Visit: Payer: Self-pay

## 2018-01-28 ENCOUNTER — Encounter (HOSPITAL_COMMUNITY): Payer: Self-pay | Admitting: Emergency Medicine

## 2018-01-28 ENCOUNTER — Observation Stay (HOSPITAL_COMMUNITY)
Admission: EM | Admit: 2018-01-28 | Discharge: 2018-01-29 | Disposition: A | Discharge status: Home / Self Care | Payer: BC Managed Care – PPO | Attending: Family Medicine | Admitting: Family Medicine

## 2018-01-28 ENCOUNTER — Emergency Department (HOSPITAL_COMMUNITY): Payer: BC Managed Care – PPO

## 2018-01-28 DIAGNOSIS — E1021 Type 1 diabetes mellitus with diabetic nephropathy: Secondary | ICD-10-CM | POA: Insufficient documentation

## 2018-01-28 DIAGNOSIS — E10319 Type 1 diabetes mellitus with unspecified diabetic retinopathy without macular edema: Secondary | ICD-10-CM

## 2018-01-28 DIAGNOSIS — Z833 Family history of diabetes mellitus: Secondary | ICD-10-CM | POA: Diagnosis not present

## 2018-01-28 DIAGNOSIS — E109 Type 1 diabetes mellitus without complications: Secondary | ICD-10-CM | POA: Diagnosis present

## 2018-01-28 DIAGNOSIS — Z79891 Long term (current) use of opiate analgesic: Secondary | ICD-10-CM | POA: Insufficient documentation

## 2018-01-28 DIAGNOSIS — O24919 Unspecified diabetes mellitus in pregnancy, unspecified trimester: Secondary | ICD-10-CM

## 2018-01-28 DIAGNOSIS — R112 Nausea with vomiting, unspecified: Secondary | ICD-10-CM | POA: Diagnosis present

## 2018-01-28 DIAGNOSIS — E860 Dehydration: Secondary | ICD-10-CM

## 2018-01-28 DIAGNOSIS — R9431 Abnormal electrocardiogram [ECG] [EKG]: Secondary | ICD-10-CM | POA: Insufficient documentation

## 2018-01-28 DIAGNOSIS — I10 Essential (primary) hypertension: Secondary | ICD-10-CM | POA: Insufficient documentation

## 2018-01-28 DIAGNOSIS — E876 Hypokalemia: Principal | ICD-10-CM | POA: Insufficient documentation

## 2018-01-28 DIAGNOSIS — Z79899 Other long term (current) drug therapy: Secondary | ICD-10-CM | POA: Insufficient documentation

## 2018-01-28 DIAGNOSIS — Z794 Long term (current) use of insulin: Secondary | ICD-10-CM | POA: Insufficient documentation

## 2018-01-28 DIAGNOSIS — E1022 Type 1 diabetes mellitus with diabetic chronic kidney disease: Secondary | ICD-10-CM | POA: Diagnosis present

## 2018-01-28 HISTORY — DX: Hypomagnesemia: E83.42

## 2018-01-28 HISTORY — DX: Dehydration: E86.0

## 2018-01-28 LAB — BASIC METABOLIC PANEL
Anion gap: 10 (ref 5–15)
BUN: 8 mg/dL (ref 6–20)
CO2: 25 mmol/L (ref 22–32)
Calcium: 8.3 mg/dL — ABNORMAL LOW (ref 8.9–10.3)
Chloride: 103 mmol/L (ref 98–111)
Creatinine, Ser: 0.83 mg/dL (ref 0.44–1.00)
GFR calc Af Amer: >60 mL/min (ref >60)
GFR calc non Af Amer: >60 mL/min (ref >60)
Glucose, Bld: 252 mg/dL — ABNORMAL HIGH (ref 70–99)
Potassium: 3 mmol/L — ABNORMAL LOW (ref 3.5–5.1)
SODIUM: 138 mmol/L (ref 135–145)

## 2018-01-28 LAB — CBC
HCT: 42.5 % (ref 36.0–46.0)
Hemoglobin: 13.5 g/dL (ref 12.0–15.0)
MCH: 27.3 pg (ref 26.0–34.0)
MCHC: 31.8 g/dL (ref 30.0–36.0)
MCV: 85.9 fL (ref 80.0–100.0)
Platelets: 618 10*3/uL — ABNORMAL HIGH (ref 150–400)
RBC: 4.95 MIL/uL (ref 3.87–5.11)
RDW: 13.3 % (ref 11.5–15.5)
WBC: 7.8 10*3/uL (ref 4.0–10.5)
nRBC: 0 % (ref 0.0–0.2)

## 2018-01-28 LAB — LIPASE, BLOOD: Lipase: 27 U/L (ref 11–51)

## 2018-01-28 LAB — MAGNESIUM: Magnesium: 1.5 mg/dL — ABNORMAL LOW (ref 1.7–2.4)

## 2018-01-28 LAB — COMPREHENSIVE METABOLIC PANEL
ALK PHOS: 56 U/L (ref 38–126)
ALT: 16 U/L (ref 0–44)
AST: 20 U/L (ref 15–41)
Albumin: 2.6 g/dL — ABNORMAL LOW (ref 3.5–5.0)
Anion gap: 16 — ABNORMAL HIGH (ref 5–15)
BUN: 8 mg/dL (ref 6–20)
CO2: 21 mmol/L — AB (ref 22–32)
Calcium: 9.3 mg/dL (ref 8.9–10.3)
Chloride: 99 mmol/L (ref 98–111)
Creatinine, Ser: 0.88 mg/dL (ref 0.44–1.00)
GFR calc Af Amer: >60 mL/min (ref >60)
GFR calc non Af Amer: >60 mL/min (ref >60)
Glucose, Bld: 269 mg/dL — ABNORMAL HIGH (ref 70–99)
Potassium: 2.7 mmol/L — CL (ref 3.5–5.1)
Sodium: 136 mmol/L (ref 135–145)
Total Bilirubin: 0.8 mg/dL (ref 0.3–1.2)
Total Protein: 7.3 g/dL (ref 6.5–8.1)

## 2018-01-28 LAB — I-STAT BETA HCG BLOOD, ED (MC, WL, AP ONLY): I-stat hCG, quantitative: <5 m[IU]/mL (ref <5)

## 2018-01-28 LAB — PHOSPHORUS: PHOSPHORUS: 4.1 mg/dL (ref 2.5–4.6)

## 2018-01-28 LAB — CBG MONITORING, ED
Glucose-Capillary: 268 mg/dL — ABNORMAL HIGH (ref 70–99)
Glucose-Capillary: 228 mg/dL — ABNORMAL HIGH (ref 70–99)

## 2018-01-28 LAB — GLUCOSE, CAPILLARY: Glucose-Capillary: 192 mg/dL — ABNORMAL HIGH (ref 70–99)

## 2018-01-28 LAB — BETA-HYDROXYBUTYRIC ACID: BETA-HYDROXYBUTYRIC ACID: 1.81 mmol/L — AB (ref 0.05–0.27)

## 2018-01-28 IMAGING — CR DG ABDOMEN ACUTE W/ 1V CHEST
3 series · 3 of 3 positions shown · non-contrast
Comparison: Chest radiographs, [DATE]

CLINICAL DATA: Pt complains of vomiting for over 1 week. She
reported she vomited 4 times today a yellowish substance. Pt
reported mild abdominal pain and her last bowel movement was earlier
today, but was normal.

EXAM:
DG ABDOMEN ACUTE W/ 1V CHEST

[w chest pa]
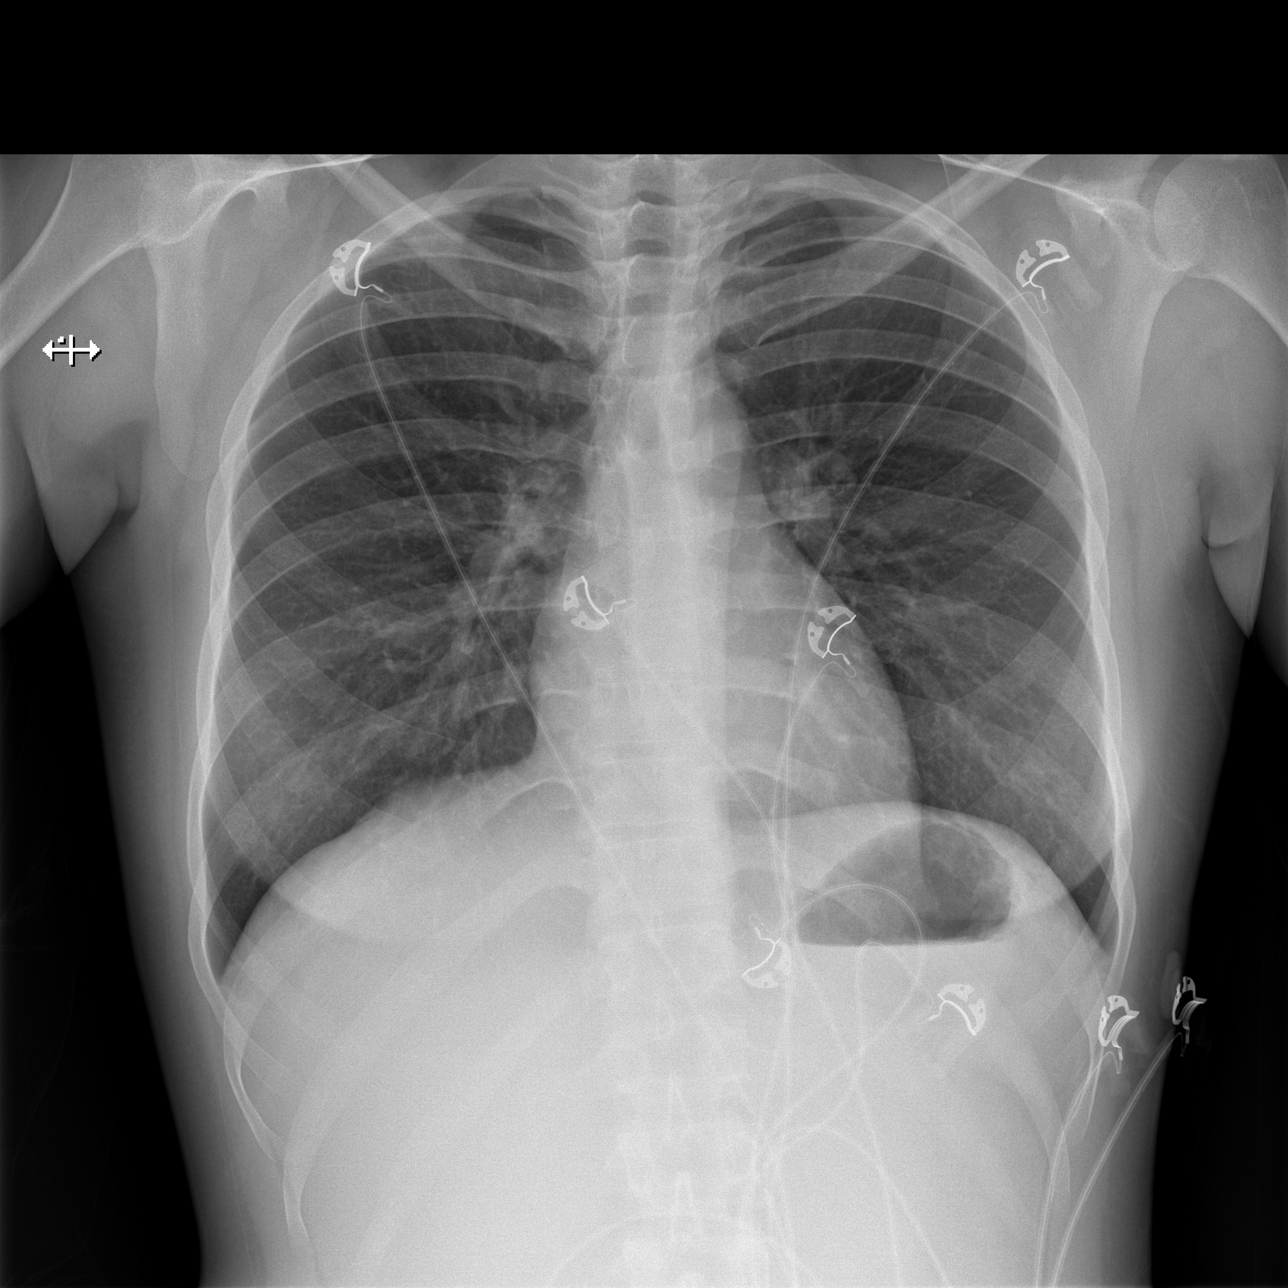

[w abdomen upright]
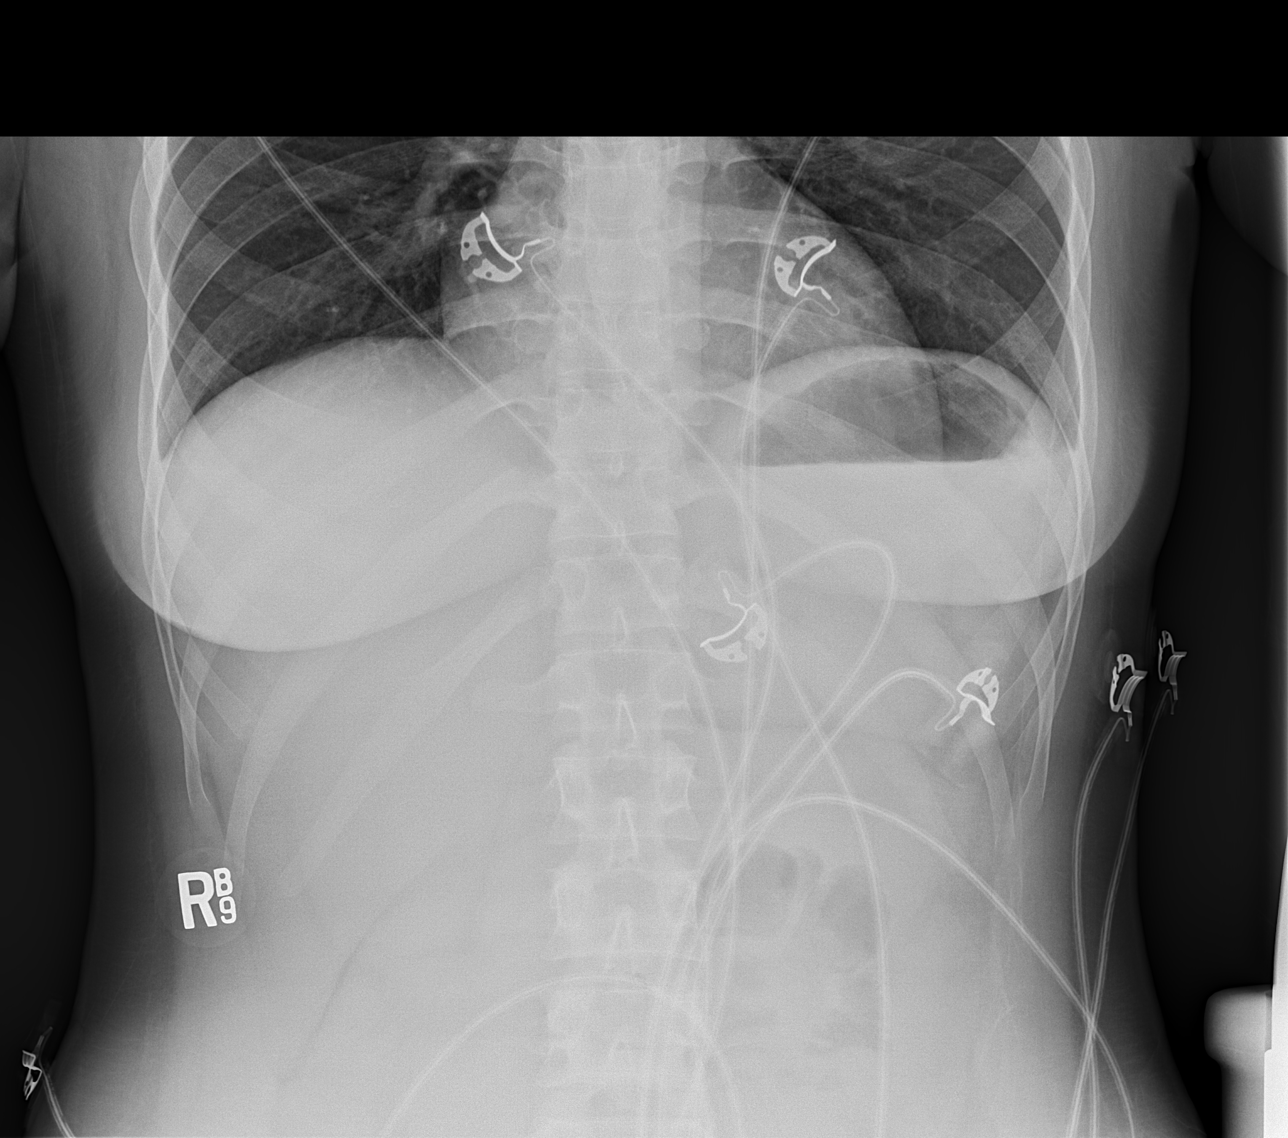

[t abdomen supine]
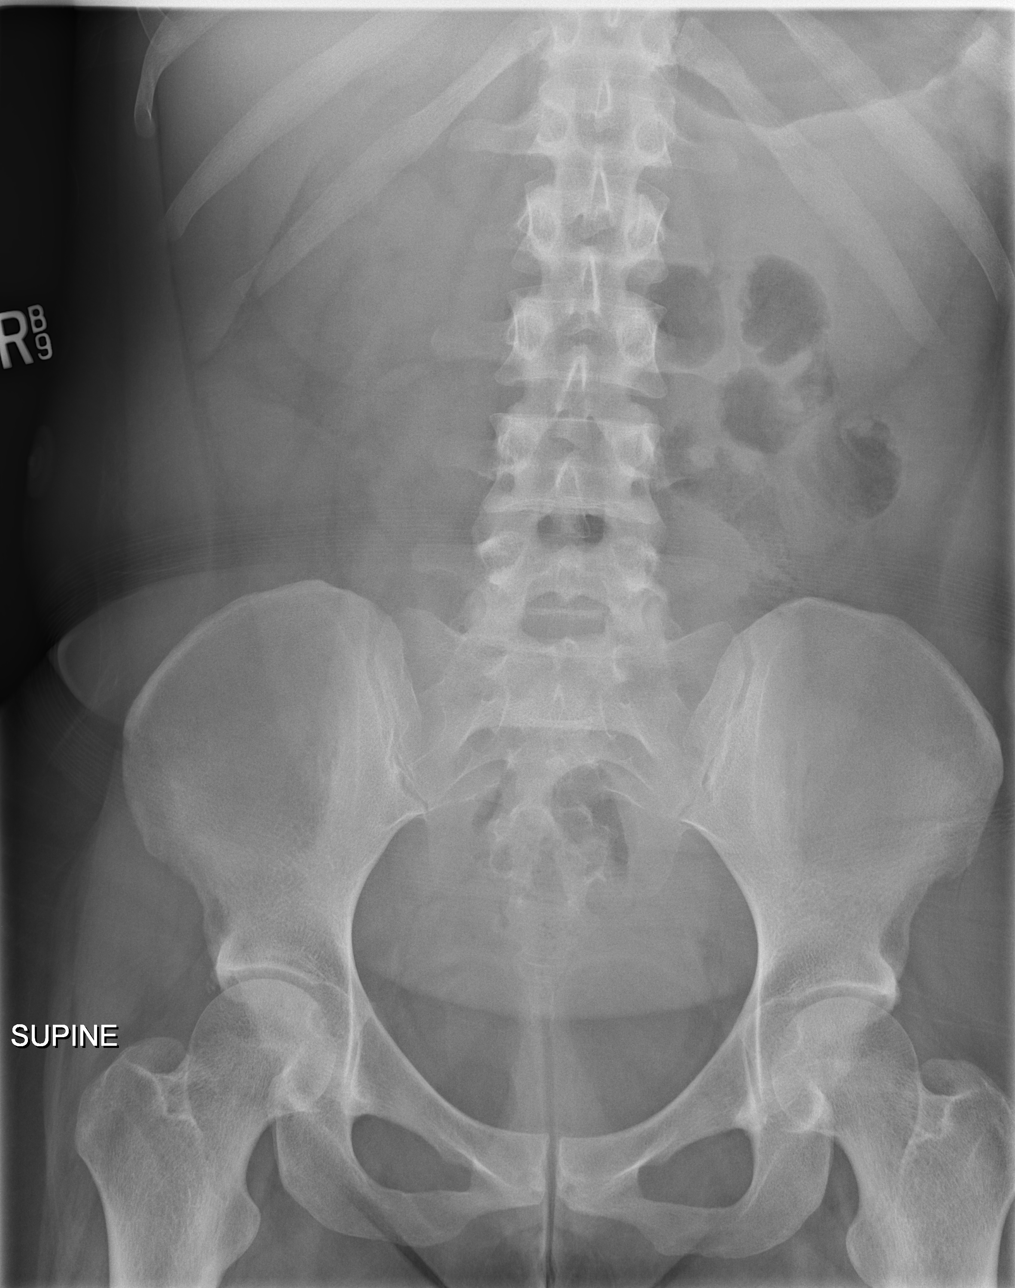

[3 of 3 positions shown; findings below may reference images not displayed]

FINDINGS: Normal bowel gas pattern.  No air-fluid levels.  No free air.

Soft tissues are unremarkable.

Normal heart, mediastinum and hila.  Clear lungs.

No skeletal abnormality.
IMPRESSION: Normal exam.

## 2018-01-28 MED ORDER — LABETALOL HCL 5 MG/ML IV SOLN
10.0000 mg | INTRAVENOUS | Status: DC | PRN
  Administered 2018-01-28: 10 mg via INTRAVENOUS
  Filled 2018-01-28 (×2): qty 4

## 2018-01-28 MED ORDER — FAMOTIDINE 20 MG PO TABS
20.0000 mg | ORAL_TABLET | Freq: Every day | ORAL | Status: DC | PRN
  Filled 2018-01-28: qty 1

## 2018-01-28 MED ORDER — HYDROCODONE-ACETAMINOPHEN 5-325 MG PO TABS: 1.0000 | ORAL_TABLET | ORAL | Status: DC | PRN

## 2018-01-28 MED ORDER — ALBUTEROL SULFATE (2.5 MG/3ML) 0.083% IN NEBU: 2.5000 mg | INHALATION_SOLUTION | RESPIRATORY_TRACT | Status: DC | PRN

## 2018-01-28 MED ORDER — INSULIN GLARGINE 100 UNIT/ML ~~LOC~~ SOLN
25.0000 [IU] | Freq: Every day | SUBCUTANEOUS | Status: DC
  Administered 2018-01-28: 25 [IU] via SUBCUTANEOUS
  Filled 2018-01-28 (×2): qty 0.25

## 2018-01-28 MED ORDER — ONDANSETRON HCL 4 MG/2ML IJ SOLN
4.0000 mg | Freq: Four times a day (QID) | INTRAMUSCULAR | Status: DC | PRN
  Administered 2018-01-28 – 2018-01-29 (×2): 4 mg via INTRAVENOUS
  Filled 2018-01-28 (×3): qty 2

## 2018-01-28 MED ORDER — MAGNESIUM SULFATE 2 GM/50ML IV SOLN
2.0000 g | Freq: Once | INTRAVENOUS | Status: AC
  Administered 2018-01-28: 2 g via INTRAVENOUS
  Filled 2018-01-28: qty 50

## 2018-01-28 MED ORDER — POTASSIUM CHLORIDE 10 MEQ/100ML IV SOLN
10.0000 meq | INTRAVENOUS | Status: AC
  Administered 2018-01-28 (×2): 10 meq via INTRAVENOUS
  Filled 2018-01-28 (×2): qty 100

## 2018-01-28 MED ORDER — ONDANSETRON HCL 4 MG PO TABS: 4.0000 mg | ORAL_TABLET | Freq: Four times a day (QID) | ORAL | Status: DC | PRN

## 2018-01-28 MED ORDER — POTASSIUM CHLORIDE ER 10 MEQ PO TBCR
20.0000 meq | EXTENDED_RELEASE_TABLET | Freq: Every day | ORAL | Status: DC
  Administered 2018-01-29: 20 meq via ORAL
  Filled 2018-01-28 (×2): qty 2

## 2018-01-28 MED ORDER — INSULIN ASPART 100 UNIT/ML ~~LOC~~ SOLN
0.0000 [IU] | SUBCUTANEOUS | Status: DC
  Administered 2018-01-28 – 2018-01-29 (×2): 2 [IU] via SUBCUTANEOUS
  Administered 2018-01-29: 1 [IU] via SUBCUTANEOUS
  Administered 2018-01-29 (×2): 2 [IU] via SUBCUTANEOUS

## 2018-01-28 MED ORDER — FERROUS SULFATE 325 (65 FE) MG PO TABS
325.0000 mg | ORAL_TABLET | Freq: Two times a day (BID) | ORAL | Status: DC
  Filled 2018-01-28: qty 1

## 2018-01-28 MED ORDER — ACETAMINOPHEN 325 MG PO TABS
650.0000 mg | ORAL_TABLET | Freq: Four times a day (QID) | ORAL | Status: DC | PRN
  Administered 2018-01-29: 650 mg via ORAL
  Filled 2018-01-28: qty 2

## 2018-01-28 MED ORDER — POTASSIUM CHLORIDE 10 MEQ/100ML IV SOLN
10.0000 meq | INTRAVENOUS | Status: AC
  Administered 2018-01-28 – 2018-01-29 (×3): 10 meq via INTRAVENOUS
  Filled 2018-01-28 (×3): qty 100

## 2018-01-28 MED ORDER — SODIUM CHLORIDE 0.9 % IV BOLUS
1000.0000 mL | Freq: Once | INTRAVENOUS | Status: AC
  Administered 2018-01-28: 1000 mL via INTRAVENOUS

## 2018-01-28 MED ORDER — ACETAMINOPHEN 650 MG RE SUPP: 650.0000 mg | Freq: Four times a day (QID) | RECTAL | Status: DC | PRN

## 2018-01-28 MED ORDER — ENALAPRIL MALEATE 10 MG PO TABS
20.0000 mg | ORAL_TABLET | Freq: Two times a day (BID) | ORAL | Status: DC
  Administered 2018-01-28 – 2018-01-29 (×2): 20 mg via ORAL
  Filled 2018-01-28 (×3): qty 2

## 2018-01-28 MED ORDER — ONDANSETRON HCL 4 MG/2ML IJ SOLN
4.0000 mg | Freq: Once | INTRAMUSCULAR | Status: AC
  Administered 2018-01-28: 4 mg via INTRAVENOUS
  Filled 2018-01-28: qty 2

## 2018-01-28 MED ORDER — SODIUM CHLORIDE 0.9 % IV SOLN
INTRAVENOUS | Status: AC
  Administered 2018-01-28: 22:00:00 via INTRAVENOUS

## 2018-01-28 MED ORDER — ENOXAPARIN SODIUM 40 MG/0.4ML ~~LOC~~ SOLN
40.0000 mg | SUBCUTANEOUS | Status: DC
  Administered 2018-01-28: 40 mg via SUBCUTANEOUS
  Filled 2018-01-28: qty 0.4

## 2018-01-28 MED ORDER — METOCLOPRAMIDE HCL 5 MG/ML IJ SOLN
5.0000 mg | Freq: Four times a day (QID) | INTRAMUSCULAR | Status: DC | PRN
  Administered 2018-01-29: 5 mg via INTRAVENOUS
  Filled 2018-01-28: qty 2

## 2018-01-28 MED ORDER — ONDANSETRON HCL 4 MG/2ML IJ SOLN: 4.0000 mg | Freq: Once | INTRAMUSCULAR | Status: DC

## 2018-01-28 MED ORDER — METOCLOPRAMIDE HCL 5 MG/ML IJ SOLN
10.0000 mg | Freq: Once | INTRAMUSCULAR | Status: AC
  Administered 2018-01-28: 10 mg via INTRAVENOUS
  Filled 2018-01-28: qty 2

## 2018-01-28 NOTE — ED Provider Notes (Addendum)
Complains of vomiting for the past several days.  She reports she vomited 4 times today.  Yellowish material.  She denies any fever denies chest pain has had mild abdominal pain though none present.  Her last bowel movement earlier today, normal.  Treated with Phenergan and Zofran and Reglan but continues to vomit.  On exam alert in no distress HEENT exam mucous membranes dry.  No facial asymmetry neck supple heart tachycardic regular rhythm abdomen normal active bowel sounds, nondistended, nontender extremities without edema.  Skin warm dry   Orlie Dakin, MD 01/28/18 1635 ED ECG REPORT   Date: 01/28/2018  Rate: 115  Rhythm: sinus tachycardia  QRS Axis: normal  Intervals: QT prolonged  ST/T Wave abnormalities: nonspecific T wave changes  Conduction Disutrbances:none  Narrative Interpretation:   Old EKG Reviewed: Ventricular rate faster than 01/26/2018 otherwise unchanged  I have personally reviewed the EKG tracing and agree with the computerized printout as noted.   Orlie Dakin, MD 01/28/18 [phone]

## 2018-01-28 NOTE — ED Notes (Signed)
Date and time results received: 01/28/18 1654 (use smartphrase ".now" to insert current time)  Test: K+ Critical Value: 2.7  Name of Provider Notified: Janeece Fitting  Orders Received? Or Actions Taken?: Actions Taken: reported via phone to [employer] K+ 2.7.

## 2018-01-28 NOTE — ED Triage Notes (Signed)
Pt c/o vomiting and reports that she been here couple times times week for the vomiting. C/o abd pains but no urinary or bowel problems.

## 2018-01-28 NOTE — ED Notes (Signed)
Attempted IV x2 unsuccessfully 

## 2018-01-28 NOTE — ED Provider Notes (Signed)
[facility] DEPT Provider Note   CSN: [id number] Arrival date & time: 01/28/18  1502     History   Chief Complaint Chief Complaint  Patient presents with  . Emesis    HPI Barbara Donovan is a 28 y.o. female.  28 y/o female with a PMH of DM, HTN, Depression and recent Hypokalemia presents to the ED with a chief complaint of vomiting x today. Patient reports 3 episodes of vomiting prior to arrival, non bilious non bloody episode. She denies any therapy for relieve in symptoms.  Reports he was seen in the ED 2 days ago for the same complaints, she reports only having 3 episodes of vomiting today.  She has been seen by her gynecologist for follow-up 2 weeks ago where she had an injection of likely Depo-Provera, she reports since her symptoms began.  Ports one episode of diarrhea while in the emergency department.  She denies any abdominal pain at this time, has not tried any medical therapy for relieving symptoms.  She denies any fever, urinary symptoms.      Past Medical History:  Diagnosis Date  . Diabetes ([place])    TYPE I  . Hypertension complicating pregnancy     Patient Active Problem List   Diagnosis Date Noted  . Intractable nausea and vomiting 01/22/2018  . Hypertensive urgency 01/22/2018  . Hypokalemia 01/22/2018  . 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 Diabetes mellitus complicating pregnancy, antepartum 12/22/2017  . Intrauterine growth restriction (IUGR) affecting care of mother 12/22/2017  . 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 Pregnancy complicated by multiple fetal congenital anomalies 10/30/2017  . Ventricular septal defect (VSD) of fetus in singleton pregnancy, antepartum 09/30/2017  . Chronic hypertension 08/21/2017  . Depression during pregnancy, antepartum 07/07/2017  . Supervision of high risk pregnancy, antepartum 07/07/2017  . Type I diabetes mellitus ([place]) 07/07/2017  . Chronic hypertension  affecting pregnancy 07/07/2017  . Diabetic retinopathy ([place]) 06/09/2017  . Acute depression 11/02/2014  . Asthma 10/30/2013    Past Surgical History:  Procedure Laterality Date  . CESAREAN SECTION N/A 12/26/2017   Procedure: CESAREAN SECTION;  Surgeon: Osborne Oman, MD;  Location: [facility];  Service: Obstetrics;  Laterality: N/A;  . NO PAST SURGERIES    . WISDOM TOOTH EXTRACTION       OB History    Gravida  1   Para  1   Term      Preterm  1   AB      Living  1     SAB      TAB      Ectopic      Multiple  0   Live Births  1            Home Medications    Prior to Admission medications   Medication Sig Start Date End Date Taking? Authorizing Provider  Blood Pressure Monitoring (BLOOD PRESSURE CUFF) MISC 1 kit by Does not apply route daily. 11/14/17  Yes Aura Camps, MD  enalapril (VASOTEC) 20 MG tablet Take 1 tablet (20 mg total) by mouth 2 (two) times daily. 12/29/17  Yes Anyanwu, Sallyanne Havers, MD  famotidine (PEPCID) 20 MG tablet Take 20 mg by mouth daily as needed for heartburn.  10/15/17  Yes [provider]  ferrous sulfate 325 (65 FE) MG tablet Take 1 tablet (325 mg total) by mouth 2 (two) times daily with a meal. 12/29/17  Yes Anyanwu, Sallyanne Havers, MD  hydrochlorothiazide (HYDRODIURIL) 25 MG tablet Take 1 tablet (25 mg total) by mouth daily. 01/12/18  Yes Starr Lake, CNM  insulin aspart (NOVOLOG) 100 UNIT/ML injection Inject 5 Units into the skin 3 (three) times daily with meals. Patient taking differently: Inject 7 Units into the skin 3 (three) times daily with meals.  12/29/17  Yes Anyanwu, Sallyanne Havers, MD  insulin glargine (LANTUS) 100 UNIT/ML injection Inject 0.12 mLs (12 Units total) into the skin at bedtime. Patient taking differently: Inject 25 Units into the skin at bedtime.  12/29/17  Yes Anyanwu, Sallyanne Havers, MD  oxyCODONE-acetaminophen (PERCOCET/ROXICET) 5-325 MG tablet Take 1-2 tablets by mouth every 4 (four)  hours as needed for moderate pain or severe pain. 12/29/17  Yes Anyanwu, Sallyanne Havers, MD  potassium chloride (K-DUR) 10 MEQ tablet Take 2 tablets (20 mEq total) by mouth daily. 01/23/18  Yes Georgette Shell, MD  Prenatal Vit-Fe Fumarate-FA (PRENATAL MULTIVITAMIN) TABS tablet Take 1 tablet by mouth daily at 12 noon.   Yes [provider]  Pyridoxine HCl (B-6 PO) Take 2 tablets by mouth daily.    Yes [provider]    Family History Family History  Problem Relation Age of Onset  . Diabetes Mother   . Diabetes Father   . Cataracts Father   . Amblyopia Neg Hx   . Blindness Neg Hx   . Glaucoma Neg Hx   . Macular degeneration Neg Hx   . Retinal detachment Neg Hx   . Strabismus Neg Hx   . Retinitis pigmentosa Neg Hx     Social History Social History   Tobacco Use  . Smoking status: Never Smoker  . Smokeless tobacco: Never Used  Substance Use Topics  . Alcohol use: Not Currently  . Drug use: Never     Allergies   Patient has no known allergies.   Review of Systems Review of Systems  Constitutional: Negative for fever.  Respiratory: Negative for shortness of breath and wheezing.   Cardiovascular: Negative for chest pain.  Gastrointestinal: Positive for diarrhea, nausea and vomiting. Negative for abdominal pain.  Genitourinary: Negative for dysuria and flank pain.  Musculoskeletal: Negative for back pain.  Skin: Negative for pallor and wound.  All other systems reviewed and are negative.    Physical Exam Updated Vital Signs BP (!) 165/91   Pulse 94   Temp 97.9 F (36.6 C)   Resp 12   Ht _0  (1.676 m)   Wt 81.6 kg   LMP 04/02/2017 (Approximate)   SpO2 100%   BMI 29.05 kg/m   Physical Exam Vitals signs and nursing note reviewed.  Constitutional:      General: She is not in acute distress.    Appearance: She is well-developed. She is ill-appearing.  HENT:     Head: Normocephalic and atraumatic.     Mouth/Throat:     Pharynx: No  oropharyngeal exudate.  Eyes:     Pupils: Pupils are equal, round, and reactive to light.  Neck:     Musculoskeletal: Normal range of motion.  Cardiovascular:     Rate and Rhythm: Regular rhythm.     Heart sounds: Normal heart sounds.  Pulmonary:     Effort: Pulmonary effort is normal. No respiratory distress.     Breath sounds: Normal breath sounds. No wheezing.  Abdominal:     General: Bowel sounds are normal. There is no distension.     Palpations: Abdomen is soft.     Tenderness: There is no  abdominal tenderness.  Musculoskeletal:        General: No tenderness or deformity.     Right lower leg: No edema.     Left lower leg: No edema.  Skin:    General: Skin is warm and dry.  Neurological:     Mental Status: She is alert and oriented to person, place, and time.      ED Treatments / Results  Labs (all labs ordered are listed, but only abnormal results are displayed) Labs Reviewed  COMPREHENSIVE METABOLIC PANEL - Abnormal; Notable for the following components:      Result Value   Potassium 2.7 (*)    CO2 21 (*)    Glucose, Bld 269 (*)    Albumin 2.6 (*)    Anion gap 16 (*)    All other components within normal limits  CBC - Abnormal; Notable for the following components:   Platelets 618 (*)    All other components within normal limits  BLOOD GAS, VENOUS - Abnormal; Notable for the following components:   pH, Ven 7.444 (*)    pCO2, Ven 38.1 (*)    Acid-Base Excess 2.1 (*)    All other components within normal limits  CBG MONITORING, ED - Abnormal; Notable for the following components:   Glucose-Capillary 268 (*)    All other components within normal limits  LIPASE, BLOOD  URINALYSIS, ROUTINE W REFLEX MICROSCOPIC  MAGNESIUM  BASIC METABOLIC PANEL  BETA-HYDROXYBUTYRIC ACID  I-STAT BETA HCG BLOOD, ED ([facility], [facility], [facility] ONLY)  CBG MONITORING, ED    EKG None  Radiology Dg Acute Abd 2+v Abd (supine,erect,decub) + 1v Chest  Result Date: 01/28/2018 CLINICAL DATA:  Pt  complains of vomiting for over 1 week. She reported she vomited 4 times today a yellowish substance. Pt reported mild abdominal pain and her last bowel movement was earlier today, but was normal. EXAM: DG ABDOMEN ACUTE W/ 1V CHEST COMPARISON:  Chest radiographs, 01/22/2018 FINDINGS: Normal bowel gas pattern.  No air-fluid levels.  No free air. Soft tissues are unremarkable. Normal heart, mediastinum and hila.  Clear lungs. No skeletal abnormality. IMPRESSION: Normal exam. Electronically Signed   By: Lajean Manes M.D.   On: 01/28/2018 17:22    Procedures Procedures (including critical care time)  Medications Ordered in ED Medications  potassium chloride 10 mEq in 100 mL IVPB (10 mEq Intravenous New Bag/Given 01/28/18 1829)  sodium chloride 0.9 % bolus 1,000 mL ( Intravenous Rate/Dose Verify 01/28/18 1829)  metoCLOPramide (REGLAN) injection 10 mg (10 mg Intravenous Given 01/28/18 1717)     Initial Impression / Assessment and Plan / ED Course  I have reviewed the triage vital signs and the nursing notes.  Pertinent labs & imaging results that were available during my care of the patient were reviewed by me and considered in my medical decision making (see chart for details).     Seen in the emergency department 2 days ago for nausea, vomiting.  Returns today after 3 episodes of vomiting nonbilious, nonbloody.  Tachycardic on arrival, will obtain IV provide her with fluids along with blood work.  Ports taking no therapy at home for relieving symptoms.  She was given Zofran prescription last time along with potassium and magnesium replacement unsure whether she was compliant with this medication.  CBC showed no leukocytosis, hemoglobin is within normal limits.  CMP showed a potassium of 2.7, patient was sent home with potassium replacement on her last visit to the ED and reports compliance with medication,  and a half also replace her potassium via IV during this visit. CBG is 268, reports compliance  with her insulin.  Anion gap is 16, BV G shows a pH of 7.4.  Magnesium pending at this time.  Heart rate slightly elevated she was tachycardic on arrival but has received a bolus along with Reglan for her nausea.  She does have a previous history of a QT prolongation, EKG obtain which show QT consistent with her previous visit.DG acute abdomen showed no acute process. At this time patient likely suffering from the beginning of DKA will place call for hospitalist admission as this is patient's thrid visit to the ED in 1 week.   7:02 PM Spoke to Dr. Roel Cluck who will admit patient will add repeat CBG along with BMP to reassess for DKA.   Final Clinical Impressions(s) / ED Diagnoses   Final diagnoses:  Hypokalemia  Non-intractable vomiting with nausea, unspecified vomiting type    ED Discharge Orders    None       Janeece Fitting, PA-C 01/28/18 1903    Orlie Dakin, MD 01/29/18 0001

## 2018-01-28 NOTE — ED Notes (Signed)
Pt ambulatory to the bathroom w/o assistance  

## 2018-01-28 NOTE — ED Notes (Signed)
Pt unable to urinate at this time. Had a bm but no urine present. Will try again at a later time. Nurse aware.

## 2018-01-28 NOTE — ED Notes (Signed)
Pt unable to give urine. Nurse aware.

## 2018-01-28 NOTE — H&P (Signed)
Barbara Donovan GQB:[id number] DOB: July 13, 1990 DOA: 01/28/2018     PCP: Patient, No Pcp Per   Outpatient Specialists:  Endocrin[facility] Patient arrived to ER on 01/28/18 at 1502  Patient coming from: home Lives alone,        Chief Complaint:  Chief Complaint  Patient presents with  . Emesis    HPI: Virtie Bungert is a 28 y.o. female with medical history significant of Dm 1, DKA in the past, HTN     Presented with  Nausea and vomiting  Last admit for DK A was in August Reports taking her insulin Decreased PO intake due to Nausea and vomiting  She is on 25 units of Lantus at bedtime and 7 of Novolog per meal Last dose of LAntus qas last night  Reports non bloody bilious vomiting Last epiosde with AM  No diarrhea  No fever  no chest pain  She is not brest feeding and not Pumping at this time    This AM BG was up to 413  She delivered a baby by on December 9th at 68 wks he has been in a NICU and recently transferred to [facility] due to Congenital cardiac abnormality.    Regarding pertinent Chronic problems: DM 1 On Lantus HTN on enalapril    While in ER: Received Reglan with some improvement. Anion Gap 16  The following Work up has been ordered so far:  Orders Placed This Encounter  Procedures  . DG ACUTE ABD 2+V ABD (SUPINE,ERECT,DECUB) + 1V CHEST  . Lipase, blood  . Comprehensive metabolic panel  . CBC  . Urinalysis, Routine w reflex microscopic  . Blood gas, venous  . Magnesium  . Diet NPO time specified  . Saline Lock IV, Maintain IV access  . Consult to hospitalist  [phone]  . I-Stat beta hCG blood, ED  . CBG monitoring, ED  . ED EKG  . EKG 12-Lead      Following Medications were ordered in ER: Medications  potassium chloride 10 mEq in 100 mL IVPB (10 mEq Intravenous New Bag/Given 01/28/18 1829)  sodium chloride 0.9 % bolus 1,000 mL ( Intravenous Rate/Dose Verify 01/28/18 1829)  metoCLOPramide (REGLAN) injection 10 mg (10 mg Intravenous Given  01/28/18 1717)    Significant initial  Findings: Abnormal Labs Reviewed  COMPREHENSIVE METABOLIC PANEL - Abnormal; Notable for the following components:      Result Value   Potassium 2.7 (*)    CO2 21 (*)    Glucose, Bld 269 (*)    Albumin 2.6 (*)    Anion gap 16 (*)    All other components within normal limits  CBC - Abnormal; Notable for the following components:   Platelets 618 (*)    All other components within normal limits  BLOOD GAS, VENOUS - Abnormal; Notable for the following components:   pH, Ven 7.444 (*)    pCO2, Ven 38.1 (*)    Acid-Base Excess 2.1 (*)    All other components within normal limits  MAGNESIUM - Abnormal; Notable for the following components:   Magnesium 1.5 (*)    All other components within normal limits  BASIC METABOLIC PANEL - Abnormal; Notable for the following components:   Potassium 3.0 (*)    Glucose, Bld 252 (*)    Calcium 8.3 (*)    All other components within normal limits  BETA-HYDROXYBUTYRIC ACID - Abnormal; Notable for the following components:   Beta-Hydroxybutyric Acid 1.81 (*)    All other components within  normal limits  CBG MONITORING, ED - Abnormal; Notable for the following components:   Glucose-Capillary 268 (*)    All other components within normal limits  CBG MONITORING, ED - Abnormal; Notable for the following components:   Glucose-Capillary 228 (*)    All other components within normal limits    Lactic Acid, Venous    Component Value Date/Time   LATICACIDVEN 1.24 09/12/2017 1603    Na 138 K 3.0  AG 10  Cr    stable,    Lab Results  Component Value Date   CREATININE 0.83 01/28/2018   CREATININE 0.88 01/28/2018   CREATININE 0.92 01/26/2018    mg 1.5  WBC 7.8  HG/HCT stable,     Component Value Date/Time   HGB 13.5 01/28/2018 1541   HGB 9.7 (L) 12/22/2017 1459   HGB 13.0 07/07/2017   HCT 42.5 01/28/2018 1541   HCT 28.5 (L) 12/22/2017 1459   HCT 39 07/07/2017      UA  not ordered    CXR  NON  acute   ECG:  Personally reviewed by me showing: HR :114 Rhythm:  Sinus tachycardia   no evidence of ischemic changes QTC 476     ED Triage Vitals  Enc Vitals Group     BP 01/28/18 1510 (!) 136/95     Pulse Rate 01/28/18 1510 (!) 127     Resp 01/28/18 1510 19     Temp 01/28/18 1510 97.9 F (36.6 C)     Temp src --      SpO2 01/28/18 1510 100 %     Weight 01/28/18 1510 180 lb (81.6 kg)     Height 01/28/18 1510 _0  (1.676 m)     Head Circumference --      Peak Flow --      Pain Score 01/28/18 1511 4     Pain Loc --      Pain Edu? --      Excl. in [place]? --   TMAX(24)@       Latest  Blood pressure (!) 165/91, pulse 94, temperature 97.9 F (36.6 C), resp. rate 12, height _1  (1.676 m), weight 81.6 kg, last menstrual period 04/02/2017, SpO2 100 %, unknown if currently breastfeeding.     Hospitalist was called for admission for dehydration hypokalemia   Review of Systems:    Pertinent positives include: fatigue  abdominal pain, nausea, vomiting,   Constitutional:  No weight loss, night sweats, Fevers, chills, weight loss  HEENT:  No headaches, Difficulty swallowing,Tooth/dental problems,Sore throat,  No sneezing, itching, ear ache, nasal congestion, post nasal drip,  Cardio-vascular:  No chest pain, Orthopnea, PND, anasarca, dizziness, palpitations.no Bilateral lower extremity swelling  GI:  No heartburn, indigestion,diarrhea, change in bowel habits, loss of appetite, melena, blood in stool, hematemesis Resp:  no shortness of breath at rest. No dyspnea on exertion, No excess mucus, no productive cough, No non-productive cough, No coughing up of blood.No change in color of mucus.No wheezing. Skin:  no rash or lesions. No jaundice GU:  no dysuria, change in color of urine, no urgency or frequency. No straining to urinate.  No flank pain.  Musculoskeletal:  No joint pain or no joint swelling. No decreased range of motion. No back pain.  Psych:  No change in mood  or affect. No depression or anxiety. No memory loss.  Neuro: no localizing neurological complaints, no tingling, no weakness, no double vision, no gait abnormality, no slurred speech, no confusion  All  systems reviewed and apart from [place] all are negative  Past Medical History:   Past Medical History:  Diagnosis Date  . Diabetes ([place])    TYPE I  . Hypertension complicating pregnancy       Past Surgical History:  Procedure Laterality Date  . CESAREAN SECTION N/A 12/26/2017   Procedure: CESAREAN SECTION;  Surgeon: Osborne Oman, MD;  Location: [place];  Service: Obstetrics;  Laterality: N/A;  . NO PAST SURGERIES    . WISDOM TOOTH EXTRACTION      Social History:  Ambulatory   Independently     reports that she has never smoked. She has never used smokeless tobacco. She reports previous alcohol use. She reports that she does not use drugs.     Family History:   Family History  Problem Relation Age of Onset  . Diabetes Mother   . Diabetes Father   . Cataracts Father   . Amblyopia Neg Hx   . Blindness Neg Hx   . Glaucoma Neg Hx   . Macular degeneration Neg Hx   . Retinal detachment Neg Hx   . Strabismus Neg Hx   . Retinitis pigmentosa Neg Hx     Allergies: No Known Allergies   Prior to Admission medications   Medication Sig Start Date End Date Taking? Authorizing Provider  Blood Pressure Monitoring (BLOOD PRESSURE CUFF) MISC 1 kit by Does not apply route daily. 11/14/17  Yes Aura Camps, MD  enalapril (VASOTEC) 20 MG tablet Take 1 tablet (20 mg total) by mouth 2 (two) times daily. 12/29/17  Yes Anyanwu, Sallyanne Havers, MD  famotidine (PEPCID) 20 MG tablet Take 20 mg by mouth daily as needed for heartburn.  10/15/17  Yes [provider]  ferrous sulfate 325 (65 FE) MG tablet Take 1 tablet (325 mg total) by mouth 2 (two) times daily with a meal. 12/29/17  Yes Anyanwu, Sallyanne Havers, MD  hydrochlorothiazide (HYDRODIURIL) 25 MG tablet Take 1 tablet (25 mg  total) by mouth daily. 01/12/18  Yes Starr Lake, CNM  insulin aspart (NOVOLOG) 100 UNIT/ML injection Inject 5 Units into the skin 3 (three) times daily with meals. Patient taking differently: Inject 7 Units into the skin 3 (three) times daily with meals.  12/29/17  Yes Anyanwu, Sallyanne Havers, MD  insulin glargine (LANTUS) 100 UNIT/ML injection Inject 0.12 mLs (12 Units total) into the skin at bedtime. Patient taking differently: Inject 25 Units into the skin at bedtime.  12/29/17  Yes Anyanwu, Sallyanne Havers, MD  oxyCODONE-acetaminophen (PERCOCET/ROXICET) 5-325 MG tablet Take 1-2 tablets by mouth every 4 (four) hours as needed for moderate pain or severe pain. 12/29/17  Yes Anyanwu, Sallyanne Havers, MD  potassium chloride (K-DUR) 10 MEQ tablet Take 2 tablets (20 mEq total) by mouth daily. 01/23/18  Yes Georgette Shell, MD  Prenatal Vit-Fe Fumarate-FA (PRENATAL MULTIVITAMIN) TABS tablet Take 1 tablet by mouth daily at 12 noon.   Yes [provider]  Pyridoxine HCl (B-6 PO) Take 2 tablets by mouth daily.    Yes [provider]   Physical Exam: Blood pressure (!) 165/91, pulse 94, temperature 97.9 F (36.6 C), resp. rate 12, height _0  (1.676 m), weight 81.6 kg, last menstrual period 04/02/2017, SpO2 100 %, unknown if currently breastfeeding. 1. General:  in No Acute distress   Chronically ill  -appearing 2. Psychological: Alert and   Oriented 3. Head/ENT:  Dry Mucous Membranes  Head Non traumatic, neck supple                           Poor Dentition 4. SKIN:   decreased Skin turgor,  Skin clean Dry and intact no rash 5. Heart: Regular rate and rhythm no  Murmur, no Rub or gallop 6. Lungs: Clear to auscultation bilaterally, no wheezes or crackles   7. Abdomen: Soft non-tender, Non distended bowel sounds present 8. Lower extremities: no clubbing, cyanosis, or  edema 9. Neurologically Grossly intact, moving all 4 extremities equally   10. MSK: Normal  range of motion   LABS:     Recent Labs  Lab 01/22/18 2106 01/23/18 0543 01/26/18 1658 01/28/18 1541  WBC 8.3 7.4 6.8 7.8  NEUTROABS  --  4.5  --   --   HGB 11.8* 10.5* 12.1 13.5  HCT 36.3 33.8* 37.7 42.5  MCV 85.4 85.8 83.6 85.9  PLT 668* 634* 700* 005*   Basic Metabolic Panel: Recent Labs  Lab 01/22/18 2106 01/23/18 0543 01/26/18 1658 01/28/18 1541  NA 134* 139 132* 136  K 2.4* 2.4* 2.4* 2.7*  CL 97* 107 94* 99  CO2 _0 21*  GLUCOSE 365* 209* 453* 269*  BUN _1 CREATININE 0.91 0.88 0.92 0.88  CALCIUM 8.7* 7.9* 8.5* 9.3  MG  --  2.2  --   --       Recent Labs  Lab 01/22/18 2106 01/26/18 1658 01/28/18 1541  AST _2 ALT _3 ALKPHOS 55 53 56  BILITOT 0.9 0.7 0.8  PROT 6.5 6.6 7.3  ALBUMIN 2.4* 2.5* 2.6*   Recent Labs  Lab 01/22/18 2106 01/26/18 1658 01/28/18 1541  LIPASE _4 No results for input(s): AMMONIA in the last 168 hours.    HbA1C: No results for input(s): HGBA1C in the last 72 hours. CBG: Recent Labs  Lab 01/23/18 0440 01/23/18 0725 01/23/18 1059 01/26/18 1619 01/28/18 1539  GLUCAP 226* 157* 154* 438* 268*      Urine analysis:    Component Value Date/Time   COLORURINE YELLOW 01/26/2018 1944   APPEARANCEUR CLEAR 01/26/2018 1944   LABSPEC 1.016 01/26/2018 1944   PHURINE 7.0 01/26/2018 1944   GLUCOSEU >=500 (A) 01/26/2018 1944   HGBUR SMALL (A) 01/26/2018 1944   BILIRUBINUR NEGATIVE 01/26/2018 1944   KETONESUR 20 (A) 01/26/2018 1944   PROTEINUR >=300 (A) 01/26/2018 1944   UROBILINOGEN 1.0 12/22/2017 1329   NITRITE NEGATIVE 01/26/2018 1944   LEUKOCYTESUR NEGATIVE 01/26/2018 1944       Cultures:    Component Value Date/Time   SDES  12/22/2017 2025    VAGINAL/RECTAL Performed at [facility], [address] [place], [place] [address]    [facility]  12/22/2017 2025    NONE Performed at [facility], [address] [place], [place] [address]    CULT  12/22/2017 2025    NO  GROUP B STREP (S.AGALACTIAE) ISOLATED Performed at [facility], [place] [address] [place], [place] [address]    REPTSTATUS 12/24/2017 FINAL 12/22/2017 2025     Radiological Exams on Admission: Dg Acute Abd 2+v Abd (supine,erect,decub) + 1v Chest  Result Date: 01/28/2018 CLINICAL DATA:  Pt complains of vomiting for over 1 week. She reported she vomited 4 times today a yellowish substance. Pt reported mild abdominal pain and her last bowel movement was earlier today, but was normal. EXAM: DG  ABDOMEN ACUTE W/ 1V CHEST COMPARISON:  Chest radiographs, 01/22/2018 FINDINGS: Normal bowel gas pattern.  No air-fluid levels.  No free air. Soft tissues are unremarkable. Normal heart, mediastinum and hila.  Clear lungs. No skeletal abnormality. IMPRESSION: Normal exam. Electronically Signed   By: Lajean Manes M.D.   On: 01/28/2018 17:22    Chart has been reviewed    Assessment/Plan   28 y.o. female with medical history significant of Dm 1, DKA in the past, HTN    Admitted for dehydration , actable nausea and vomiting and hypokalemia  Present on Admission: . Intractable nausea and vomiting-treat supportively rehydrate and give Reglan as needed as this is helped in the past. Would benefit from evaluation for gastroparesis . Type I diabetes mellitus (HCC) -anion gap currently closed continue to rehydrate. Continue home insulin and follow closely blood sugars . Chronic hypertension -continue home medications difficult to manage secondary to nausea vomiting . Hypokalemia -will replace and follow . Hypomagnesemia - we will replace . Dehydration -we will rehydrate . Accelerated hypertension -treat with as needed labetalol   Other plan as per orders.  DVT prophylaxis:   Lovenox     Code Status:  FULL CODE as per patient   I had personally discussed CODE STATUS with patient   Family Communication:   Family not  at  Bedside    Disposition Plan:      To home once workup is complete and  patient is stable                                      Consults called: none    Admission status:   Obs    Level of care    tele  For  24H         Eris Breck 01/28/2018, 8:23 PM   [facility]  Pager [phone]   after 2 AM please page floor coverage PA If 7AM-7PM, please contact the day team taking care of the patient  [email or web address]  Password [email or web address]

## 2018-01-29 DIAGNOSIS — E876 Hypokalemia: Secondary | ICD-10-CM | POA: Diagnosis not present

## 2018-01-29 DIAGNOSIS — E10319 Type 1 diabetes mellitus with unspecified diabetic retinopathy without macular edema: Secondary | ICD-10-CM | POA: Diagnosis not present

## 2018-01-29 DIAGNOSIS — I10 Essential (primary) hypertension: Secondary | ICD-10-CM | POA: Diagnosis not present

## 2018-01-29 LAB — PHOSPHORUS: Phosphorus: 4.1 mg/dL (ref 2.5–4.6)

## 2018-01-29 LAB — GLUCOSE, CAPILLARY
GLUCOSE-CAPILLARY: 171 mg/dL — AB (ref 70–99)
Glucose-Capillary: 188 mg/dL — ABNORMAL HIGH (ref 70–99)
Glucose-Capillary: 135 mg/dL — ABNORMAL HIGH (ref 70–99)

## 2018-01-29 LAB — HEMOGLOBIN A1C
Hgb A1c MFr Bld: 6.8 % — ABNORMAL HIGH (ref 4.8–5.6)
Mean Plasma Glucose: 148.46 mg/dL

## 2018-01-29 LAB — COMPREHENSIVE METABOLIC PANEL
ALK PHOS: 46 U/L (ref 38–126)
ALT: 14 U/L (ref 0–44)
AST: 15 U/L (ref 15–41)
Albumin: 2.3 g/dL — ABNORMAL LOW (ref 3.5–5.0)
Anion gap: 10 (ref 5–15)
BUN: 7 mg/dL (ref 6–20)
CALCIUM: 8.1 mg/dL — AB (ref 8.9–10.3)
CO2: 23 mmol/L (ref 22–32)
Chloride: 105 mmol/L (ref 98–111)
Creatinine, Ser: 0.8 mg/dL (ref 0.44–1.00)
GFR calc Af Amer: >60 mL/min (ref >60)
GFR calc non Af Amer: >60 mL/min (ref >60)
GLUCOSE: 186 mg/dL — AB (ref 70–99)
Potassium: 2.7 mmol/L — CL (ref 3.5–5.1)
SODIUM: 138 mmol/L (ref 135–145)
TOTAL PROTEIN: 6.2 g/dL — AB (ref 6.5–8.1)
Total Bilirubin: 0.8 mg/dL (ref 0.3–1.2)

## 2018-01-29 LAB — CBC
HCT: 34.5 % — ABNORMAL LOW (ref 36.0–46.0)
Hemoglobin: 10.8 g/dL — ABNORMAL LOW (ref 12.0–15.0)
MCH: 27.3 pg (ref 26.0–34.0)
MCHC: 31.3 g/dL (ref 30.0–36.0)
MCV: 87.1 fL (ref 80.0–100.0)
Platelets: 563 10*3/uL — ABNORMAL HIGH (ref 150–400)
RBC: 3.96 MIL/uL (ref 3.87–5.11)
RDW: 13.6 % (ref 11.5–15.5)
WBC: 9.4 10*3/uL (ref 4.0–10.5)
nRBC: 0 % (ref 0.0–0.2)

## 2018-01-29 LAB — TSH: TSH: 1.275 u[IU]/mL (ref 0.350–4.500)

## 2018-01-29 LAB — MAGNESIUM: MAGNESIUM: 2.1 mg/dL (ref 1.7–2.4)

## 2018-01-29 MED ORDER — POTASSIUM CHLORIDE ER 10 MEQ PO TBCR: 20.0000 meq | EXTENDED_RELEASE_TABLET | Freq: Every day | ORAL | 0 refills | Status: DC

## 2018-01-29 MED ORDER — POTASSIUM CHLORIDE CRYS ER 20 MEQ PO TBCR
40.0000 meq | EXTENDED_RELEASE_TABLET | Freq: Once | ORAL | Status: AC
  Administered 2018-01-29: 40 meq via ORAL

## 2018-01-29 MED ORDER — POTASSIUM CHLORIDE CRYS ER 20 MEQ PO TBCR
40.0000 meq | EXTENDED_RELEASE_TABLET | Freq: Once | ORAL | Status: DC
  Filled 2018-01-29: qty 2

## 2018-01-29 MED ORDER — METOCLOPRAMIDE HCL 5 MG PO TABS: 5.0000 mg | ORAL_TABLET | Freq: Three times a day (TID) | ORAL | 1 refills | Status: DC

## 2018-01-29 MED ORDER — ACETAMINOPHEN 325 MG PO TABS: 650.0000 mg | ORAL_TABLET | Freq: Four times a day (QID) | ORAL | 0 refills | Status: DC | PRN

## 2018-01-29 MED ORDER — AMLODIPINE BESYLATE 5 MG PO TABS
5.0000 mg | ORAL_TABLET | Freq: Every day | ORAL | Status: DC
  Administered 2018-01-29: 5 mg via ORAL
  Filled 2018-01-29: qty 1

## 2018-01-29 MED ORDER — ONDANSETRON 4 MG PO TBDP: 4.0000 mg | ORAL_TABLET | Freq: Three times a day (TID) | ORAL | 0 refills | Status: DC | PRN

## 2018-01-29 MED ORDER — AMLODIPINE BESYLATE 5 MG PO TABS: 5.0000 mg | ORAL_TABLET | Freq: Every day | ORAL | 1 refills | Status: DC

## 2018-01-29 NOTE — Discharge Summary (Signed)
Barbara Donovan, is a 28 y.o. female  DOB Mar 06, 1990  MRN [id number].  Admission date:  01/28/2018  Admitting Physician  Toy Baker, MD  Discharge Date:  01/29/2018   Primary MD  Patient, No Pcp Per  Recommendations for primary care physician for things to follow:   1)Avoid ibuprofen/Advil/Aleve/Motrin/Goody Powders/Naproxen/BC powders/Meloxicam/Diclofenac/Indomethacin and other Nonsteroidal anti-inflammatory medications as these will make you more likely to bleed and can cause stomach ulcers, can also cause Kidney problems.  2) stop hydrochlorothiazide/HCTZ due to electrolyte abnormalities and dehydration risk 3) take amlodipine 5 mg daily for better blood pressure control 4) use Reglan/metoclopramide and Zofran/ondansetron as prescribed for nausea vomiting 5) follow-up with the primary care physician within a week for recheck and repeat BMP/basic metabolic test 6) continue diabetic diet and insulin as advised  Admission Diagnosis  Hypokalemia [E87.6] Non-intractable vomiting with nausea, unspecified vomiting type [R11.2]   Discharge Diagnosis  Hypokalemia [E87.6] Non-intractable vomiting with nausea, unspecified vomiting type [R11.2]    Active Problems:   Type I diabetes mellitus (HCC)   Chronic hypertension   Intractable nausea and vomiting   Hypokalemia   Hypomagnesemia   Dehydration   Accelerated hypertension      Past Medical History:  Diagnosis Date  . Diabetes ([place])    TYPE I  . Hypertension complicating pregnancy     Past Surgical History:  Procedure Laterality Date  . CESAREAN SECTION N/A 12/26/2017   Procedure: CESAREAN SECTION;  Surgeon: Osborne Oman, MD;  Location: [facility];  Service: Obstetrics;  Laterality: N/A;  . NO PAST SURGERIES    . WISDOM TOOTH EXTRACTION         HPI  from the history and physical done on the day of admission:     HPI: Barbara Donovan is a 28 y.o. female with medical history significant of Dm 1, DKA in the past, HTN     Presented with  Nausea and vomiting  Last admit for DK A was in August Reports taking her insulin Decreased PO intake due to Nausea and vomiting  She is on 25 units of Lantus at bedtime and 7 of Novolog per meal Last dose of LAntus qas last night  Reports non bloody bilious vomiting Last epiosde with AM  No diarrhea  No fever  no chest pain  She is not brest feeding and not Pumping at this time    This AM BG was up to 413  She delivered a baby by on December 9th at 35 wks he has been in a NICU and recently transferred to [facility] due to Congenital cardiac abnormality.    Regarding pertinent Chronic problems: DM 1 On Lantus HTN on enalapril    While in ER: Received Reglan with some improvement. Anion Gap 16    Hospital Course:       Brief summary 28 year old with type 1 diabetes and hypertension admitted on 01/28/2018 with intractable emesis and hypokalemia in the setting of HCTZ use  Plan: 1) intractable emesis--- abdominal x-rays without obstructive findings, query  gastroparesis versus viral gastritis--no diarrhea, no fevers, no chills,--- symptoms is resolved with antiemetics and hydration--Reglan as prescribed upon discharge,   2)DM1- A1c today is 6.6 reflecting excellent diabetic control,, continue current insulin regimen  3) hypertension--- patient with persistent hypokalemia, magnesium has been replaced, continue to replace potassium, stop HCTZ due to concerns about persistent hypokalemia and hypomagnesemia with risk for dehydration in a patient with possible gastroparesis.... Okay to use amlodipine and enalapril as prescribed for BP control... HCTZ has been stopped  Discharge Condition: Stable  Follow UP--- PCP in a week for recheck  Consults obtained - n/a  Diet and Activity recommendation:  As advised  Discharge Instructions    Discharge  Instructions    Activity as tolerated - No restrictions   Complete by:  As directed    Call MD for:  difficulty breathing, headache or visual disturbances   Complete by:  As directed    Call MD for:  persistant dizziness or light-headedness   Complete by:  As directed    Call MD for:  persistant nausea and vomiting   Complete by:  As directed    Call MD for:  severe uncontrolled pain   Complete by:  As directed    Call MD for:  temperature >100.4   Complete by:  As directed    Diet - low sodium heart healthy   Complete by:  As directed    Diet Carb Modified   Complete by:  As directed    Discharge instructions   Complete by:  As directed    1)Avoid ibuprofen/Advil/Aleve/Motrin/Goody Powders/Naproxen/BC powders/Meloxicam/Diclofenac/Indomethacin and other Nonsteroidal anti-inflammatory medications as these will make you more likely to bleed and can cause stomach ulcers, can also cause Kidney problems.  2) stop hydrochlorothiazide/HCTZ due to electrolyte abnormalities and dehydration risk 3) take amlodipine 5 mg daily for better blood pressure control 4) use Reglan/metoclopramide and Zofran/ondansetron as prescribed for nausea vomiting 5) follow-up with the primary care physician within a week for recheck and repeat BMP/basic metabolic test 6) continue diabetic diet and insulin as advised        Discharge Medications     Allergies as of 01/29/2018   No Known Allergies     Medication List    STOP taking these medications   hydrochlorothiazide 25 MG tablet Commonly known as:  HYDRODIURIL     TAKE these medications   acetaminophen 325 MG tablet Commonly known as:  TYLENOL Take 2 tablets (650 mg total) by mouth every 6 (six) hours as needed for mild pain, fever or headache (or Fever >/= 101).   amLODipine 5 MG tablet Commonly known as:  NORVASC Take 1 tablet (5 mg total) by mouth daily. For Blood Pressure Start taking on:  January 30, 2018   B-6 PO Take 2 tablets by  mouth daily.   Blood Pressure Cuff Misc 1 kit by Does not apply route daily.   enalapril 20 MG tablet Commonly known as:  VASOTEC Take 1 tablet (20 mg total) by mouth 2 (two) times daily.   famotidine 20 MG tablet Commonly known as:  PEPCID Take 20 mg by mouth daily as needed for heartburn.   ferrous sulfate 325 (65 FE) MG tablet Take 1 tablet (325 mg total) by mouth 2 (two) times daily with a meal.   insulin aspart 100 UNIT/ML injection Commonly known as:  novoLOG Inject 5 Units into the skin 3 (three) times daily with meals. What changed:  how much to take   insulin glargine  100 UNIT/ML injection Commonly known as:  LANTUS Inject 0.12 mLs (12 Units total) into the skin at bedtime. What changed:  how much to take   metoCLOPramide 5 MG tablet Commonly known as:  REGLAN Take 1 tablet (5 mg total) by mouth 3 (three) times daily before meals. For 3 weeks and then every 8 hours as needed for N/V after that   ondansetron 4 MG disintegrating tablet Commonly known as:  ZOFRAN ODT Take 1 tablet (4 mg total) by mouth every 8 (eight) hours as needed for nausea or vomiting.   oxyCODONE-acetaminophen 5-325 MG tablet Commonly known as:  PERCOCET/ROXICET Take 1-2 tablets by mouth every 4 (four) hours as needed for moderate pain or severe pain.   potassium chloride 10 MEQ tablet Commonly known as:  K-DUR Take 2 tablets (20 mEq total) by mouth daily for 5 days.   prenatal multivitamin Tabs tablet Take 1 tablet by mouth daily at 12 noon.       Major procedures and Radiology Reports - PLEASE review detailed and final reports for all details, in brief -    Dg Chest 2 View  Result Date: 01/22/2018 CLINICAL DATA:  Nausea, vomiting, and cough since yesterday, history diabetes mellitus, hypertension EXAM: CHEST - 2 VIEW COMPARISON:  None FINDINGS: Normal heart size, mediastinal contours, and pulmonary vascularity. Probable LEFT nipple shadow, seen on lateral view as well. Lungs clear. No  definite infiltrate, pleural effusion or pneumothorax. Bones unremarkable. IMPRESSION: No acute abnormalities. Electronically Signed   By: Lavonia Dana M.D.   On: 01/22/2018 23:24   Dg Acute Abd 2+v Abd (supine,erect,decub) + 1v Chest  Result Date: 01/28/2018 CLINICAL DATA:  Pt complains of vomiting for over 1 week. She reported she vomited 4 times today a yellowish substance. Pt reported mild abdominal pain and her last bowel movement was earlier today, but was normal. EXAM: DG ABDOMEN ACUTE W/ 1V CHEST COMPARISON:  Chest radiographs, 01/22/2018 FINDINGS: Normal bowel gas pattern.  No air-fluid levels.  No free air. Soft tissues are unremarkable. Normal heart, mediastinum and hila.  Clear lungs. No skeletal abnormality. IMPRESSION: Normal exam. Electronically Signed   By: Lajean Manes M.D.   On: 01/28/2018 17:22    Micro Results   No results found for this or any previous visit (from the past 240 hour(s)).     Today   Subjective    Barbara Donovan today has no concerns, eating and drinking better, no further emesis, no diarrhea, no fevers no chills          Patient has been seen and examined prior to discharge   Objective   Blood pressure (!) 155/93, pulse 93, temperature 98.4 F (36.9 C), temperature source Oral, resp. rate 14, height _0  (1.676 m), weight 69.5 kg, last menstrual period 04/02/2017, SpO2 100 %, unknown if currently breastfeeding.   Intake/Output Summary (Last 24 hours) at 01/29/2018 1352 Last data filed at 01/29/2018 1114 Gross per 24 hour  Intake 3123.63 ml  Output -  Net 3123.63 ml    Exam Gen:- Awake Alert, no acute distress  HEENT:- [place].AT, No sclera icterus Neck-Supple Neck,No JVD,.  Lungs-  CTAB , good air movement bilaterally  CV- S1, S2 normal, regular Abd-  +ve B.Sounds, Abd Soft, No tenderness,    Extremity/Skin:- No  edema,   good pulses Psych-affect is appropriate, oriented x3 Neuro-no new focal deficits, no tremors    Data Review   CBC w  Diff:  Lab Results  Component Value Date  WBC 9.4 01/29/2018   HGB 10.8 (L) 01/29/2018   HGB 9.7 (L) 12/22/2017   HGB 13.0 07/07/2017   HCT 34.5 (L) 01/29/2018   HCT 28.5 (L) 12/22/2017   HCT 39 07/07/2017   PLT 563 (H) 01/29/2018   PLT 427 12/22/2017   PLT 374 07/07/2017   LYMPHOPCT 26 01/23/2018   MONOPCT 11 01/23/2018   EOSPCT 1 01/23/2018   BASOPCT 1 01/23/2018    CMP:  Lab Results  Component Value Date   NA 138 01/29/2018   NA 135 12/22/2017   K 2.7 (LL) 01/29/2018   CL 105 01/29/2018   CO2 23 01/29/2018   BUN 7 01/29/2018   BUN 8 12/22/2017   CREATININE 0.80 01/29/2018   PROT 6.2 (L) 01/29/2018   PROT 4.5 (L) 12/22/2017   ALBUMIN 2.3 (L) 01/29/2018   ALBUMIN 2.2 (L) 12/22/2017   BILITOT 0.8 01/29/2018   BILITOT <0.2 12/22/2017   ALKPHOS 46 01/29/2018   AST 15 01/29/2018   ALT 14 01/29/2018  . Total Discharge time is about 33 minutes  Roxan Hockey M.D on 01/29/2018 at 1:52 PM  Go to [email or web address] -  for contact info  [facility] - Office  [phone]

## 2018-01-29 NOTE — Discharge Instructions (Signed)
1)Avoid ibuprofen/Advil/Aleve/Motrin/Goody Powders/Naproxen/BC powders/Meloxicam/Diclofenac/Indomethacin and other Nonsteroidal anti-inflammatory medications as these will make you more likely to bleed and can cause stomach ulcers, can also cause Kidney problems.  2) stop hydrochlorothiazide/HCTZ due to electrolyte abnormalities and dehydration risk 3) take amlodipine 5 mg daily for better blood pressure control 4) use Reglan/metoclopramide and Zofran/ondansetron as prescribed for nausea vomiting 5) follow-up with the primary care physician within a week for recheck and repeat BMP/basic metabolic test 6) continue diabetic diet and insulin as advised

## 2018-01-30 LAB — BLOOD GAS, VENOUS
Acid-Base Excess: 2.1 mmol/L — ABNORMAL HIGH (ref 0.0–2.0)
Bicarbonate: 25.7 mmol/L (ref 20.0–28.0)
O2 Saturation: 37.9 %
Patient temperature: 98.6
pCO2, Ven: 38.1 mmHg — ABNORMAL LOW (ref 44.0–60.0)
pH, Ven: 7.444 — ABNORMAL HIGH (ref 7.250–7.430)

## 2018-02-10 ENCOUNTER — Ambulatory Visit: Payer: BC Managed Care – PPO | Admitting: Clinical

## 2018-02-10 ENCOUNTER — Telehealth: Payer: Self-pay | Admitting: *Deleted

## 2018-02-10 ENCOUNTER — Ambulatory Visit (INDEPENDENT_AMBULATORY_CARE_PROVIDER_SITE_OTHER): Payer: BC Managed Care – PPO | Admitting: Obstetrics & Gynecology

## 2018-02-10 ENCOUNTER — Encounter: Payer: Self-pay | Admitting: Obstetrics & Gynecology

## 2018-02-10 VITALS — BP 152/96 | HR 113 | Wt 154.0 lb

## 2018-02-10 DIAGNOSIS — F332 Major depressive disorder, recurrent severe without psychotic features: Secondary | ICD-10-CM

## 2018-02-10 DIAGNOSIS — O99345 Other mental disorders complicating the puerperium: Secondary | ICD-10-CM

## 2018-02-10 DIAGNOSIS — F53 Postpartum depression: Secondary | ICD-10-CM

## 2018-02-10 MED ORDER — SERTRALINE HCL 50 MG PO TABS: 50.0000 mg | ORAL_TABLET | Freq: Every day | ORAL | 2 refills | Status: DC

## 2018-02-10 NOTE — Patient Instructions (Signed)
Perinatal Depression When a woman feels excessive sadness, anger, or anxiety during pregnancy or during the first 12 months after she gives birth, she has a condition called perinatal depression. Depression can interfere with work, school, relationships, and other everyday activities. If it is not managed properly, it can also cause problems in the mother and her baby. Sometimes, perinatal depression is left untreated because symptoms are thought to be normal mood swings during and right after pregnancy. If you have symptoms of depression, it is important to talk with your health care provider. What are the causes? The exact cause of this condition is not known. Hormonal changes during and after pregnancy may play a role in causing perinatal depression. What increases the risk? You are more likely to develop this condition if:  You have a personal or family history of depression, anxiety, or mood disorders.  You experience a stressful life event during pregnancy, such as the death of a loved one.  You have a lot of regular life stress.  You do not have support from family members or loved ones, or you are in an abusive relationship. What are the signs or symptoms? Symptoms of this condition include:  Feeling sad or hopeless.  Feelings of guilt.  Feeling irritable or overwhelmed.  Changes in your appetite.  Lack of energy or motivation.  Sleep problems.  Difficulty concentrating or completing tasks.  Loss of interest in hobbies or relationships.  Headaches or stomach problems that do not go away. How is this diagnosed? This condition is diagnosed based on a physical exam and mental evaluation. In some cases, your health care provider may use a depression screening tool. These tools include a list of questions that can help a health care provider diagnose depression. Your health care provider may refer you to a mental health expert who specializes in depression. How is this  treated? This condition may be treated with:  Medicines. Your health care provider will only give you medicines that have been proven safe for pregnancy and breastfeeding.  Talk therapy with a mental health professional to help change your patterns of thinking (cognitive behavioral therapy).  Support groups.  Brain stimulation or light therapies.  Stress reduction therapies, such as mindfulness. Follow these instructions at home: Lifestyle  Do not use any products that contain nicotine or tobacco, such as cigarettes and e-cigarettes. If you need help quitting, ask your health care provider.  Do not use alcohol when you are pregnant. After your baby is born, limit alcohol intake to no more than 1 drink a day. One drink equals 12 oz of beer, 5 oz of wine, or 1 oz of hard liquor.  Consider joining a support group for new mothers. Ask your health care provider for recommendations.  Take good care of yourself. Make sure you: ? Get plenty of sleep. If you are having trouble sleeping, talk with your health care provider. ? Eat a healthy diet. This includes plenty of fruits and vegetables, whole grains, and lean proteins. ? Exercise regularly, as told by your health care provider. Ask your health care provider what exercises are safe for you. General instructions  Take over-the-counter and prescription medicines only as told by your health care provider.  Talk with your partner or family members about your feelings during pregnancy. Share any concerns or anxieties that you may have.  Ask for help with tasks or chores when you need it. Ask friends and family members to provide meals, watch your children, or help with   cleaning.  Keep all follow-up visits as told by your health care provider. This is important. Contact a health care provider if:  You (or people close to you) notice that you have any symptoms of depression.  You have depression and your symptoms get worse.  You  experience side effects from medicines, such as nausea or sleep problems. Get help right away if:  You feel like hurting yourself, your baby, or someone else. If you ever feel like you may hurt yourself or others, or have thoughts about taking your own life, get help right away. You can go to your nearest emergency department or call:  Your local emergency services (911 in the [place]  A suicide crisis helpline, such as the National Suicide Prevention Lifeline at 1-800-273-8255. This is open 24 hours a day. Summary  Perinatal depression is when a woman feels excessive sadness, anger, or anxiety during pregnancy or during the first 12 months after she gives birth.  If perinatal depression is not treated, it can lead to health problems for the mother and her baby.  This condition is treated with medicines, talk therapy, stress reduction therapies, or a combination of two or more treatments.  Talk with your partner or family members about your feelings. Do not be afraid to ask for help. This information is not intended to replace advice given to you by your health care provider. Make sure you discuss any questions you have with your health care provider. Document Released: 03/03/2016 Document Revised: 03/03/2016 Document Reviewed: 03/03/2016 Elsevier Interactive Patient Education  2019 Elsevier Inc.  

## 2018-02-10 NOTE — BH Specialist Note (Signed)
Integrated Behavioral Health Follow Up Visit  MRN: [id number] Name: Barbara Donovan  Number of [facility] Clinician visits: 2/6 Session Start time: 10:41  Session End time: 10:55 Total time: 15 minutes  Type of Service: [place] Interpretor:No. Interpretor Name and Language: n/a  SUBJECTIVE: Barbara Donovan is a 28 y.o. female accompanied by n/a Patient was referred by Emeterio Reeve, MD for depression, anxiety . Patient reports the following symptoms/concerns: Pt primary symptoms are depression, anhedonia, lack of appetite, worry, anxiety, fatigue, guilt. Pt feels she has more support than at last visit; feels stress over having to go back to work before baby is home from NICU.  Duration of problem: Ongoing, increase postpartum; Severity of problem: severe  OBJECTIVE: Mood: Depressed and Affect: Depressed Risk of harm to self or others: No plan to harm self or others  LIFE CONTEXT: Family and Social: Pt lives with FOB; baby in NICU at [employer]/Work: Plans to return to work soon Self-Care: - Life Changes: Recent childbirth and baby in NICU  GOALS ADDRESSED: Patient will: 1.  Reduce symptoms of: depression  2.  Demonstrate ability to: Increase healthy adjustment to current life circumstances  INTERVENTIONS: Interventions utilized:  Supportive Counseling Standardized Assessments completed: GAD-7 and PHQ 9  ASSESSMENT: Patient currently experiencing Major depressive disorder, recurrent, severe, without psychotic features.   Patient may benefit from ongoing therapeutic interventions regarding coping with symptoms of depression.Marland Kitchen  PLAN: 1. Follow up with behavioral health clinician on : One week 2. Behavioral recommendations:  -Begin taking Zoloft, as prescribed by medical provider today -Continue using self-coping strategies that have helped in the past 3. Referral(s): [place] (In  Clinic) 4. "From scale of 1-10, how likely are you to follow plan?": -  Garlan Fair, LCSW  Depression screen [facility] 2/9 02/10/2018 12/05/2017 11/28/2017 11/14/2017  Decreased Interest 1 1 2 1   Down, Depressed, Hopeless 1 1 3 2   PHQ - 2 Score 2 2 5 3   Altered sleeping 0 1 2 3   Tired, decreased energy 1 1 2 1   Change in appetite 1 0 3 1  Feeling bad or failure about yourself  1 0 3 2  Trouble concentrating 0 0 0 0  Moving slowly or fidgety/restless 0 0 0 0  Suicidal thoughts 0 0 0 0  PHQ-9 Score 5 4 15 10    GAD 7 : Generalized Anxiety Score 02/10/2018 12/05/2017 11/28/2017 11/14/2017  Nervous, Anxious, on Edge 2 2 3 1   Control/stop worrying 2 2 3 1   Worry too much - different things 2 2 3 1   Trouble relaxing 2 2 2 1   Restless 2 0 0 0  Easily annoyed or irritable 2 3 2 1   Afraid - awful might happen 2 0 2 0  Total GAD 7 Score 14 11 15  5

## 2018-02-10 NOTE — Telephone Encounter (Signed)
Received a voicemail from [facility] stating they wanted to see if Dr.Arnold was aware this patient was also on metoclopramide 5 mg daily.  States there is a potential interaction that can cause things like tardive dyskinesia which can be permanent so they will not fill it until they hear back from [name].  Will route to Dr. Roselie Awkward as our office is closed and he has left.

## 2018-02-10 NOTE — Progress Notes (Signed)
error 

## 2018-02-10 NOTE — Progress Notes (Signed)
Subjective:     Barbara Donovan is a 28 y.o. female who presents for a postpartum visit. She is 6 weeks postpartum following a low cervical transverse Cesarean section. I have fully reviewed the prenatal and intrapartum course. The delivery was at [redacted]w[redacted]d gestational weeks. Outcome: primary cesarean section, low transverse incision. Anesthesia: spinal. Postpartum course has been unremrakanle. 75 course has been baby in NICU in [place] . Baby is feeding by feeds in hospital. Bleeding no bleeding. Bowel function is normal. Bladder function is normal. Patient is not sexually active. Contraception method is Depo-Provera injections. Postpartum depression screening: positive.  The following portions of the patient's history were reviewed and updated as appropriate: allergies, current medications, past family history, past medical history, past social history, past surgical history and problem list.  Review of Systems Pertinent items are noted in HPI.   Objective:    LMP 04/02/2017 (Approximate)   General:  alert, cooperative and flat affect     Lungs: nl effort  Heart:  nl rate  Abdomen: soft, non-tender; bowel sounds normal; no masses,  no organomegaly and incision dry and intact and not tender   Vulva:  not evaluated  Vagina: not evaluated                    Assessment:     6 week postpartum exam. Pap smear not done at today's visit.   Plan:    1. Contraception: undecided 2. Zoloft 50 mg for depression 3. Follow up in: 1 week or as needed.    Woodroe Mode, MD 02/10/2018

## 2018-02-11 NOTE — Telephone Encounter (Signed)
Patient can discontinue Reglan if she is still taking it

## 2018-02-12 ENCOUNTER — Other Ambulatory Visit: Payer: Self-pay | Admitting: Student

## 2018-02-13 ENCOUNTER — Telehealth: Payer: Self-pay | Admitting: *Deleted

## 2018-02-13 NOTE — Telephone Encounter (Signed)
See telephone note dated 02/13/18- this was addressed with pharmacy and patient.

## 2018-02-13 NOTE — Telephone Encounter (Signed)
Called pt and left detailed message stating that Dr. Roselie Awkward would like her to stop taking the medication Metoclopramide (Reglan). We have received information from her pharmacy that taking this drug can cause a possible dangerous side effect due to other medications she is taking. She may continue to take Ondansetron for her nausea and vomiting. If she has questions she may contact our office. *Note - information received from pharmacy was that this medication can potentially cause Tardive Dyskinesia which can be permanent. Pharmacy was called and advised of D/C order per Dr. Roselie Awkward.

## 2018-02-14 ENCOUNTER — Telehealth: Payer: Self-pay | Admitting: Clinical

## 2018-02-14 NOTE — Telephone Encounter (Signed)
Left HIPPA-compliant message to call back Barbara Donovan from [facility]  at [phone].

## 2018-02-16 ENCOUNTER — Other Ambulatory Visit (HOSPITAL_COMMUNITY): Payer: Self-pay | Admitting: Obstetrics and Gynecology

## 2018-02-27 ENCOUNTER — Other Ambulatory Visit: Payer: Self-pay | Admitting: Obstetrics & Gynecology

## 2018-02-27 DIAGNOSIS — F53 Postpartum depression: Secondary | ICD-10-CM

## 2018-02-27 DIAGNOSIS — O99345 Other mental disorders complicating the puerperium: Principal | ICD-10-CM

## 2018-02-27 MED ORDER — SERTRALINE HCL 50 MG PO TABS: 50.0000 mg | ORAL_TABLET | Freq: Every day | ORAL | 1 refills | Status: DC

## 2018-02-27 NOTE — Addendum Note (Signed)
Addended by: Woodroe Mode on: 02/27/2018 05:14 PM   Modules accepted: Orders

## 2018-03-03 MED ORDER — SERTRALINE HCL 50 MG PO TABS: 50.0000 mg | ORAL_TABLET | Freq: Every day | ORAL | 1 refills | Status: DC

## 2018-03-03 NOTE — Addendum Note (Signed)
Addended by: Woodroe Mode on: 03/03/2018 10:30 AM   Modules accepted: Orders

## 2018-03-22 ENCOUNTER — Other Ambulatory Visit (HOSPITAL_COMMUNITY): Payer: Self-pay | Admitting: Obstetrics & Gynecology

## 2018-03-23 ENCOUNTER — Encounter: Payer: Self-pay | Admitting: Endocrinology

## 2018-03-23 ENCOUNTER — Ambulatory Visit: Payer: BC Managed Care – PPO | Admitting: Endocrinology

## 2018-03-23 ENCOUNTER — Other Ambulatory Visit: Payer: Self-pay

## 2018-03-23 VITALS — BP 160/100 | HR 100 | Ht 66.0 in | Wt 156.2 lb

## 2018-03-23 DIAGNOSIS — E109 Type 1 diabetes mellitus without complications: Secondary | ICD-10-CM | POA: Diagnosis not present

## 2018-03-23 LAB — POCT GLYCOSYLATED HEMOGLOBIN (HGB A1C): Hemoglobin A1C: 11.5 % — AB (ref 4.0–5.6)

## 2018-03-23 MED ORDER — PEN NEEDLES 32G X 5 MM MISC: 1.0000 | Freq: Four times a day (QID) | 11 refills | Status: DC

## 2018-03-23 MED ORDER — INSULIN GLARGINE 100 UNIT/ML SOLOSTAR PEN: 30.0000 [IU] | PEN_INJECTOR | SUBCUTANEOUS | 99 refills | Status: DC

## 2018-03-23 MED ORDER — INSULIN ASPART 100 UNIT/ML FLEXPEN: 6.0000 [IU] | PEN_INJECTOR | Freq: Three times a day (TID) | SUBCUTANEOUS | 11 refills | Status: DC

## 2018-03-23 NOTE — Progress Notes (Signed)
Subjective:    Patient ID: Barbara Donovan, female    DOB: 05-08-1990, 28 y.o.   MRN: [id number]  HPI Pt returns for f/u of diabetes mellitus: DM type: 1 Dx'ed: 7782 Complications: none Therapy: insulin since 2009 DKA: last episode was 2015.  Severe hypoglycemia: never Pancreatitis: never Pancreatic imaging: never.  Other: she takes multiple daily injections; she took pump rx during 2019 pregnancy, but not continuous glucose monitor, due to cost.  Interval history:  She is 3 mos postpartum.  She is not at risk for pregnancy now.  Meter is downloaded today, and the printout is scanned into the record.  cbg varies from 150-500, but most are approx 200.  There is no trend throughout the day.  She cannot afford to resume the pump.  pt states she feels well in general.  Past Medical History:  Diagnosis Date  . Acute depression 11/02/2014  . Asthma 10/30/2013  . Chronic hypertension 08/21/2017  . Chronic hypertension during pregnancy, antepartum 08/19/2017  . Dehydration 01/28/2018  . Depression during pregnancy, antepartum 07/07/2017   6/20: Short trial of zoloft previously, reports didn't help much but also didn't give it a chance Discussed r/b/a SSRIs in pregnancy, agrees to try Zoloft again, rx sent No SI/HI/red flags  . Diabetes ([employer])    TYPE I  . 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  . Hypertension complicating pregnancy   . Hypertension in pregnancy 11/02/2017  . Hypertensive urgency 01/22/2018  . Hypokalemia 01/22/2018  . Hypomagnesemia 01/28/2018  . Intractable nausea and vomiting 01/22/2018  . Intrauterine growth restriction (IUGR) affecting care of mother 12/22/2017  . Morbid obesity ([place])   . 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 Severe hyperemesis gravidarum 10/30/2017  . Supervision of high risk  pregnancy, antepartum 07/07/2017    Nursing Staff Provider Office Location  [place] Dating  By 13w [name] Language  English Anatomy US  Multiple anomalies Flu Vaccine  Declined Genetic Screen  NIPS:   AFP:   First Screen:  Quad:   TDaP vaccine   11/14/17 Hgb A1C or  GTT Early  Third trimester  Rhogam     LAB RESULTS  Feeding Plan Breast Blood Type  A +  Contraception Undecided Antibody  Negative Circumcision Yes Rubella  Immune Pediatri  . Type I diabetes mellitus ([place]) 07/07/2017   Current Diabetic Medications:  Insulin  [x]  Aspirin 81 mg daily after 12 weeks (? A2/B GDM)  Required Referrals for A1GDM or A2GDM: [x]  Diabetes Education and Testing Supplies [x]  Nutrition Cousult  For A2/B GDM or higher classes of DM [x]  Diabetes Education and Testing Supplies [x]  Nutrition Counsult [x]  Fetal ECHO after 20 weeks  [x]  Eye exam for retina evaluation - severe retinopathy 7/19  Base  . Ventricular septal defect (VSD) of fetus in singleton pregnancy, antepartum 09/30/2017   May go to newborn nursery per Dr. Lenard Simmer Echo prior to discharge    Past Surgical History:  Procedure Laterality Date  . CESAREAN SECTION N/A 12/26/2017   Procedure: CESAREAN SECTION;  Surgeon: Osborne Oman, MD;  Location: [facility];  Service: Obstetrics;  Laterality: N/A;  . NO PAST SURGERIES    . WISDOM TOOTH EXTRACTION      Social History   Socioeconomic History  . Marital status: Single    Spouse name: Not on file  . Number  of children: Not on file  . Years of education: Not on file  . Highest education level: Not on file  Occupational History  . Not on file  Social Needs  . Financial resource strain: Not on file  . Food insecurity:    Worry: Not on file    Inability: Not on file  . Transportation needs:    Medical: Not on file    Non-medical: Not on file  Tobacco Use  . Smoking status: Never Smoker  . Smokeless tobacco: Never Used  Substance and Sexual Activity  . Alcohol use: Not Currently    Comment:  SOCIALLY  . Drug use: Never  . Sexual activity: Not Currently    Birth control/protection: None  Lifestyle  . Physical activity:    Days per week: Not on file    Minutes per session: Not on file  . Stress: Not on file  Relationships  . Social connections:    Talks on phone: Not on file    Gets together: Not on file    Attends religious service: Not on file    Active member of club or organization: Not on file    Attends meetings of clubs or organizations: Not on file    Relationship status: Not on file  . Intimate partner violence:    Fear of current or ex partner: Not on file    Emotionally abused: Not on file    Physically abused: Not on file    Forced sexual activity: Not on file  Other Topics Concern  . Not on file  Social History Narrative  . Not on file    Current Outpatient Medications on File Prior to Visit  Medication Sig Dispense Refill  . amLODipine (NORVASC) 5 MG tablet Take 1 tablet (5 mg total) by mouth daily. For Blood Pressure 30 tablet 1  . ferrous sulfate 325 (65 FE) MG tablet Take 1 tablet (325 mg total) by mouth 2 (two) times daily with a meal. 60 tablet 3   No current facility-administered medications on file prior to visit.     No Known Allergies  Family History  Problem Relation Age of Onset  . Diabetes Mother   . Aneurysm Mother 40  . Diabetes Father   . Cataracts Father   . Healthy Sister   . Healthy Brother   . Healthy Daughter   . Amblyopia Neg Hx   . Blindness Neg Hx   . Glaucoma Neg Hx   . Macular degeneration Neg Hx   . Retinal detachment Neg Hx   . Strabismus Neg Hx   . Retinitis pigmentosa Neg Hx     BP (!) 160/100 (BP Location: Right Arm, Patient Position: Sitting, Cuff Size: Normal)   Pulse 100   Ht 5\' 6"  (1.676 m)   Wt 156 lb 3.2 oz (70.9 kg)   SpO2 96%   BMI 25.21 kg/m    Review of Systems She denies hypoglycemia    Objective:   Physical Exam VITAL SIGNS:  See vs page GENERAL: no distress Pulses: dorsalis  pedis intact bilat.   MSK: no deformity of the feet CV: trace bilat leg edema Skin:  no ulcer on the feet.  normal color and temp on the feet. Neuro: sensation is intact to touch on the feet   Lab Results  Component Value Date   CREATININE 0.80 01/29/2018   BUN 7 01/29/2018   NA 138 01/29/2018   K 2.7 (LL) 01/29/2018   CL 105 01/29/2018  CO2 23 01/29/2018   Lab Results  Component Value Date   HGBA1C 11.5 (A) 03/23/2018       Assessment & Plan:  HTN: is noted today.  Type 1 DM: worse.   Patient Instructions  Your blood pressure is high today.  Please see your primary care provider soon, to have it rechecked.   check your blood sugar 5 times a day, before the 3 meals, at bedtime, and after meals on a rotating basis.  also check if you have symptoms of your blood sugar being too high or too low.  please keep a record of the readings and bring it to your next appointment here (or you can bring the meter itself).  You can write it on any piece of paper. please call us sooner if your blood sugar goes below 70, or if you have a lot of readings over 200.  Please change the insulins to the numbers listed below.   Take 1 extra unit of novolog for any blood sugar in the 200's. And 2 extra for any over 300.   Please come back for a follow-up appointment in 2 months.   In view of your medical condition, you should avoid pregnancy until we have decided it is safe.

## 2018-03-23 NOTE — Patient Instructions (Addendum)
Your blood pressure is high today.  Please see your primary care provider soon, to have it rechecked.   check your blood sugar 5 times a day, before the 3 meals, at bedtime, and after meals on a rotating basis.  also check if you have symptoms of your blood sugar being too high or too low.  please keep a record of the readings and bring it to your next appointment here (or you can bring the meter itself).  You can write it on any piece of paper. please call us sooner if your blood sugar goes below 70, or if you have a lot of readings over 200.  Please change the insulins to the numbers listed below.   Take 1 extra unit of novolog for any blood sugar in the 200's. And 2 extra for any over 300.   Please come back for a follow-up appointment in 2 months.   In view of your medical condition, you should avoid pregnancy until we have decided it is safe.

## 2018-03-27 ENCOUNTER — Encounter: Payer: Self-pay | Admitting: Cardiology

## 2018-03-27 DIAGNOSIS — R0602 Shortness of breath: Secondary | ICD-10-CM | POA: Insufficient documentation

## 2018-03-27 NOTE — Progress Notes (Signed)
Cardiology Office Note    Date:  03/28/2018   ID:  Barbara Donovan, DOB [birth year], MRN [id number]  PCP:  Patient, No Pcp Per  Cardiologist:  Fransico Him, MD   Chief Complaint  Patient presents with  . Shortness of Breath    History of Present Illness:  Barbara Donovan is a 28 y.o. female who is being seen today for the evaluation of shortness of breath at the request of [name], MD.  This is a 28 year old female who was referred to cardiology for evaluation of shortness of breath in December 2019.  At that time she was 35 weeks and 2 days when she saw her OB/GYN for ongoing prenatal care.  She had a high risk pregnancy due to 1 diabetes mellitus with diabetic retinopathy, depression during pregnancy, and chronic hypertension affecting pregnancy.  Her pregnancy was also complicated by multiple fetal congenital anomalies including VSD, severe hyperemesis gravidarum, proteinuria, asthma and intrauterine growth restriction.  She was seen on 12/22/2017 by her OB/GYN and reported shortness of breath but no associated fever, chills, cough or chest pain.  She was also noticing lower extremity edema.  That time her blood pressure was mildly elevated at 148/87 mmHg.  Is on Procardia and labetalol for blood pressure control.  She had nephrotic range proteinuria.  She was started on Lasix 40 mg daily twice daily for 3 days.  X-ray at that time showed no acute abnormalities.  On 12/27/2017 she underwent C-section with evidence of fetal distress.  Was placed back on Lasix 40 mg twice daily.  He was discharged home in stable condition on 12/29/2017.  She was sent home on enalapril 20 mg twice daily, Lasix 40 mg twice daily.  Her postpartum state was complicated by severe hyperemesis requiring ER visit on 01/22/2018 was hypertensive urgency.  She was admitted overnight 1 L of saline as well as IV hydralazine.  He was seen back again several days later with similar symptoms.  Was hypokalemic at 2.4 and given  potassium and magnesium supplementation.  Had several hospitalizations for nausea and vomiting.  Of note she does have a history of QT prolongation in the past.  Her admission on 01/28/2018 was felt that she was in early DKA.  She was admitted and HCTZ was stopped due to persistent hypokalemia and hypomagnesemia.  She was started on amlodipine for blood pressure control.  She is now here for evaluation of shortness of breath that occurred while she was pregnant.  She  is doing well.  She denies any SOB, DOE, PND, orthopnea, dizziness, palpitations or syncope.  She says that since her delivery her shortness of breath is completely resolved.  She still has occasional episodes of lower extremity edema and is currently not on a diuretic.  She also occasionally gets chest pain that she describes a pressure but it only occurs when she is at work and resolves when she goes home so she thinks is the stress of her job.  She works as a Research officer, trade union.  She does not smoke but does have a family history of MI in her father.  She is compliant with her meds and is tolerating meds with no SE.     Past Medical History:  Diagnosis Date  . Asthma 10/30/2013  . Chronic hypertension during pregnancy, antepartum 08/19/2017  . Dehydration 01/28/2018  . Depression during pregnancy, antepartum 07/07/2017   6/20: Short trial of zoloft previously, reports didn't help much but also didn't give it a chance  Discussed r/b/a SSRIs in pregnancy, agrees to try Zoloft again, rx sent No SI/HI/red flags  . Diabetes ([place])    TYPE I  . 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  . Hypokalemia 01/22/2018  . Hypomagnesemia 01/28/2018  . Intractable nausea and vomiting 01/22/2018  . Intrauterine growth restriction (IUGR) affecting care of mother 12/22/2017  . Morbid obesity ([place])   . 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 Proteinuria due to type 1 diabetes  mellitus ([place]) 11/02/2017   Baseline Pr: Cr 10.23  . Severe hyperemesis gravidarum 10/30/2017  . Type I diabetes mellitus ([place]) 07/07/2017   Current Diabetic Medications:  Insulin  [x]  Aspirin 81 mg daily after 12 weeks (? A2/B GDM)  Required Referrals for A1GDM or A2GDM: [x]  Diabetes Education and Testing Supplies [x]  Nutrition Cousult  For A2/B GDM or higher classes of DM [x]  Diabetes Education and Testing Supplies [x]  Nutrition Counsult [x]  Fetal ECHO after 20 weeks  [x]  Eye exam for retina evaluation - severe retinopathy 7/19  Base  . Ventricular septal defect (VSD) of fetus in singleton pregnancy, antepartum 09/30/2017   May go to newborn nursery per Dr. Lenard Simmer Echo prior to discharge    Past Surgical History:  Procedure Laterality Date  . CESAREAN SECTION N/A 12/26/2017   Procedure: CESAREAN SECTION;  Surgeon: Osborne Oman, MD;  Location: [facility];  Service: Obstetrics;  Laterality: N/A;  . NO PAST SURGERIES    . WISDOM TOOTH EXTRACTION      Current Medications: Current Meds  Medication Sig  . amLODipine (NORVASC) 5 MG tablet Take 1 tablet (5 mg total) by mouth daily. For Blood Pressure  . ferrous sulfate 325 (65 FE) MG tablet Take 1 tablet (325 mg total) by mouth 2 (two) times daily with a meal.  . insulin aspart (NOVOLOG FLEXPEN) 100 UNIT/ML FlexPen Inject 6 Units into the skin 3 (three) times daily with meals.  . Insulin Glargine (LANTUS SOLOSTAR) 100 UNIT/ML Solostar Pen Inject 30 Units into the skin every morning.  . Insulin Pen Needle (PEN NEEDLES) 32G X 5 MM MISC 1 Device by Does not apply route 4 (four) times daily.    Allergies:   Patient has no known allergies.   Social History   Socioeconomic History  . Marital status: Single    Spouse name: Not on file  . Number of children: Not on file  . Years of education: Not on file  . Highest education level: Not on file  Occupational History  . Not on file  Social Needs  . Financial resource strain: Not on  file  . Food insecurity:    Worry: Not on file    Inability: Not on file  . Transportation needs:    Medical: Not on file    Non-medical: Not on file  Tobacco Use  . Smoking status: Never Smoker  . Smokeless tobacco: Never Used  Substance and Sexual Activity  . Alcohol use: Not Currently    Comment: SOCIALLY  . Drug use: Never  . Sexual activity: Not Currently    Birth control/protection: None  Lifestyle  . Physical activity:    Days per week: Not on file    Minutes per session: Not on file  . Stress: Not on file  Relationships  . Social connections:    Talks on phone: Not on file    Gets together: Not on file    Attends religious service: Not on file  Active member of club or organization: Not on file    Attends meetings of clubs or organizations: Not on file    Relationship status: Not on file  Other Topics Concern  . Not on file  Social History Narrative  . Not on file     Family History:  The patient's family history includes Aneurysm (age of onset: 80) in her mother; Cataracts in her father; Diabetes in her father and mother; Healthy in her brother, daughter, and sister.   ROS:   Please see the history of present illness.    ROS All other systems reviewed and are negative.  No flowsheet data found.     PHYSICAL EXAM:   VS:  BP (!) 138/96   Pulse 90   Ht 5\' 6"  (1.676 m)   Wt 152 lb 3.2 oz (69 kg)   SpO2 99%   BMI 24.57 kg/m    GEN: Well nourished, well developed, in no acute distress  HEENT: normal  Neck: no JVD, carotid bruits, or masses Cardiac: RRR; no murmurs, rubs, or gallops,no edema.  Intact distal pulses bilaterally.  Respiratory:  clear to auscultation bilaterally, normal work of breathing GI: soft, nontender, nondistended, + BS MS: no deformity or atrophy  Skin: warm and dry, no rash Neuro:  Alert and Oriented x 3, Strength and sensation are intact Psych: euthymic mood, full affect  Wt Readings from Last 3 Encounters:  03/28/18 152 lb  3.2 oz (69 kg)  03/23/18 156 lb 3.2 oz (70.9 kg)  02/10/18 154 lb (69.9 kg)      Studies/Labs Reviewed:   EKG:  EKG is  ordered today and showed NSR with no ST changes  Recent Labs: 01/29/2018: ALT 14; BUN 7; Creatinine, Ser 0.80; Hemoglobin 10.8; Magnesium 2.1; Platelets 563; Potassium 2.7; Sodium 138; TSH 1.275   Lipid Panel No results found for: CHOL, TRIG, HDL, CHOLHDL, VLDL, LDLCALC, LDLDIRECT  Additional studies/ records that were reviewed today include:  Hospital notes form 12/2017 to 03/23/2018  ASSESSMENT:    1. SOB (shortness of breath)   2. Chronic hypertension      PLAN:  In order of problems listed above:  1.  SOB - shortness of breath was in her last trimester of pregnancy and likely related to decreased lung volumes from enlarging uterus as well as volume overload.  2D echo done at that time showed normal LV function with EF 60 to 65% and normal diastolic function.  There was a trivial pericardial effusion.  She really has no cardiac risk factors for CAD and is fairly young to have developed significant coronary disease.  She does have history of poorly controlled hypertension is never smoked and has no family history of CAD.  Shortness of breath has resolved since delivering her baby.  Further work-up at this time especially in the setting of normal echo.  2.  Chronic HTN -diastolic BP is elevated on exam today.Marland Kitchen  She will continue on amlodipine 5 mg daily.  I am going to add a diuretic today for her blood pressure as well as lower extremity edema.  She has had problems with hypomagnesemia and hypokalemia in the past.  Therefore I will add Aldactone 25 mg daily.  I will have her follow-up in 1 week for a bmet as well as magnesium level.  She will follow-up in 2 weeks in hypertension clinic.  3.  History of prolonged QTC -she was instructed to avoid QT prolonging drugs.  We gave her a list of  those drugs today.  I suspect this was likely more related to her underlying low  potassium in the past.  Her QTC today is 442 ms.  4.  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  I suspect stress-induced.  Given her family history of CAD and history of hypertension I am going to get an exercise treadmill test to rule out ischemia.   Medication Adjustments/Labs and Tests Ordered: Current medicines are reviewed at length with the patient today.  Concerns regarding medicines are outlined above.  Medication changes, Labs and Tests ordered today are listed in the Patient Instructions below.  There are no Patient Instructions on file for this visit.   Signed, Fransico Him, MD  03/28/2018 7:46 AM    [facility] [address], [place], [place]  [address] Phone: [phone]; Fax: [phone]

## 2018-03-28 ENCOUNTER — Ambulatory Visit: Payer: BC Managed Care – PPO | Admitting: Cardiology

## 2018-03-28 ENCOUNTER — Encounter: Payer: Self-pay | Admitting: Cardiology

## 2018-03-28 VITALS — BP 138/96 | HR 90 | Ht 66.0 in | Wt 152.2 lb

## 2018-03-28 DIAGNOSIS — I10 Essential (primary) hypertension: Secondary | ICD-10-CM

## 2018-03-28 DIAGNOSIS — R0602 Shortness of breath: Secondary | ICD-10-CM | POA: Diagnosis not present

## 2018-03-28 DIAGNOSIS — R9431 Abnormal electrocardiogram [ECG] [EKG]: Secondary | ICD-10-CM

## 2018-03-28 DIAGNOSIS — R079 Chest pain, unspecified: Secondary | ICD-10-CM

## 2018-03-28 MED ORDER — SPIRONOLACTONE 25 MG PO TABS: 25.0000 mg | ORAL_TABLET | Freq: Every day | ORAL | 3 refills | Status: DC

## 2018-03-28 NOTE — Addendum Note (Signed)
Addended by: Fransico Him R on: 03/28/2018 09:05 AM   Modules accepted: Level of Service

## 2018-03-28 NOTE — Patient Instructions (Addendum)
Medication Instructions:  Start: Aldactone 25 mg, daily, by mouth  If you need a refill on your cardiac medications before your next appointment, please call your pharmacy.   Lab work: Future: BMET and Magnesium in 1 week around 04/04/18. Please call the office to schedule.   If you have labs (blood work) drawn today and your tests are completely normal, you will receive your results only by: Marland Kitchen MyChart Message (if you have MyChart) OR . A paper copy in the mail If you have any lab test that is abnormal or we need to change your treatment, we will call you to review the results.  Testing/Procedures: Your physician has requested that you have an exercise tolerance test. For further information please visit [email or web address]. Please also follow instruction sheet, as given.  Follow-Up: At [facility], you and your health needs are our priority.  As part of our continuing mission to provide you with exceptional heart care, we have created designated Provider Care Teams.  These Care Teams include your primary Cardiologist (physician) and Advanced Practice Providers (APPs -  Physician Assistants and Nurse Practitioners) who all work together to provide you with the care you need, when you need it.  Your physician recommends that you schedule a follow-up appointment in: 2 weeks with [facility].   You will need a follow up appointment in 1 years.  Please call our office 2 months in advance to schedule this appointment.  You may see Dr. Radford Pax or one of the following Advanced Practice Providers on your designated Care Team:   [place], PA-C Melina Copa, PA-C . Ermalinda Barrios, PA-C  Any Other Special Instructions Will Be Listed Below (If Applicable).  Long QT Syndrome  Long QT syndrome (LQTS) is a heart condition in which the heart takes longer than normal to recharge after each heartbeat. This is caused by an abnormal electrical system in the heart. LQTS can upset the  timing of your heartbeats. It can cause dangerous changes in your heart rate and rhythm (arrhythmia). There are several types of LQTS. The three most common types are:  Type 1. This can be triggered by stress or exercise, especially swimming.  Type 2. This can be triggered by strong emotions or surprise.  Type 3. This can be triggered when your heart slows during sleep. You can be born with LQTS, or you can develop it later in life. What are the causes? The cause of this condition depends on the type of LQTS that you have.  Inherited LQTS. You are born with this condition. It is caused by an abnormal gene that is passed down through your family.  Acquired LQTS. You get this condition later in life. It may be caused by: ? Certain medicines that affect your heartbeat. Some examples are methadone and antihistamines. ? Long periods of vomiting or diarrhea. ? An eating disorder. ? A thyroid disorder. What increases the risk? This condition is more likely to develop in:  People who are born deaf.  Women.  People who have an eating disorder, such as anorexia nervosa or bulimia.  People who have a family member with LQTS.  People who have a family history of unexplained fainting, drowning, or sudden death. What are the signs or symptoms? Symptoms of this condition include:  Fainting.  A fluttering feeling in your chest.  Loud gasping during sleep.  Seizures. Symptoms of inherited LQTS almost always start before age 1.  Some people with this condition have no symptoms. How is  this diagnosed? This condition may be diagnosed based on:  Your symptoms.  Your medical history and family history.  A physical exam.  Some tests, including: ? An electrocardiogram (ECG) to measure electrical activity in your heart. ? Holter monitoring to record your heartbeat for 1-2 days. ? Stress test to record your heartbeat while you exercise. ? A blood test to look for genes that cause  LQTS. How is this treated? There is no cure for this condition. Treatment depends on the cause, your symptoms, and whether you have a family history of sudden death. Treatment may include:  Making lifestyle changes, such as avoiding competitive sports or stressful situations.  Taking supplements to correct abnormal salt (sodium), potassium, calcium, and magnesium levels.  Stopping the use of a medicine. Do not stop the use of medicines without first talking to your health care provider.  Taking heart medicines, such as beta blockers.  Implanting a device that corrects a dangerous heartbeat, such as: ? A pacemaker. This helps your heart beat in a normal rhythm. ? A cardioverter-defibrillator. This senses a fast heartbeat and shocks the heart to restore normal heart rate.  Having heart surgery to prevent arrhythmias. Follow these instructions at home: Medicine  Take over-the-counter and prescription medicines only as told by your health care provider.  If you want to take any new medicine, get approval from your health care provider first. Avoid any medicines that can cause this condition. Lifestyle  Make any lifestyle changes that are recommended by your health care provider. You may need to avoid: ? Competitive sports. ? Strenuous exercises and activities, such as swimming. ? Stress. ? Situations where sudden loud noises are likely.  If you drink alcohol, limit how much you have: ? 0-2 drinks a day for men. ? 0-1 drink a day for women.  Be aware of how much alcohol is in your drink. In the [place] one drink equals one typical bottle of beer (12 oz), one-half glass of wine (5 oz), or one shot of hard liquor (1 oz).  Do not use any products that contain nicotine or tobacco, such as cigarettes and e-cigarettes. If you need help quitting, ask your health care provider. General instructions  Develop a plan with your health care provider for how to deal with a sudden  arrhythmia.  Tell people who live with you about the signs of a sudden arrhythmia.  Wear a medical ID necklace or bracelet that states your diagnosis and contact information.  Have an automated external defibrillator (AED) available at home or work.  Get treatment and support if you feel stress, fear, anxiety, or depression.  Keep all follow-up visits as told by your health care provider. This is important. Contact a health care provider if:  You are suffering from stress, fear, anxiety, or depression.  You vomit.  You have diarrhea. Get help right away if:  You have chest pain or difficulty breathing.  You have a fluttering feeling in your chest.  You faint.  You have a seizure. These symptoms may represent a serious problem that is an emergency. Do not wait to see if the symptoms will go away. Get medical help right away. Call your local emergency services (911 in the [place] Do not drive yourself to the hospital. Summary  Long QT syndrome (LQTS) is a heart condition in which your heart takes longer than normal to recharge after each heartbeat. This is caused by an abnormal electrical system in your heart.  LQTS can upset the  timing of your heartbeats and cause dangerous changes in your heart rate and rhythm (arrhythmia).  Some people are born with LQTS (inherited). Others develop it later in life (acquired).  Some people with this condition have no symptoms. Those who do have symptoms may experience fainting, a fluttering feeling in the chest, loud gasping during sleep, or seizures.  There is no cure for this condition. Treatment depends on the cause, your symptoms, and whether you have a family history of sudden death. This information is not intended to replace advice given to you by your health care provider. Make sure you discuss any questions you have with your health care provider. Document Released: 11/01/2008 Document Revised: 02/01/2017 Document Reviewed:  02/01/2017 Elsevier Interactive Patient Education  2019 [employer].

## 2018-03-28 NOTE — Addendum Note (Signed)
Addended by: Patterson Hammersmith A on: 03/28/2018 02:57 PM   Modules accepted: Orders

## 2018-04-23 ENCOUNTER — Encounter: Payer: Self-pay | Admitting: Endocrinology

## 2018-04-24 ENCOUNTER — Other Ambulatory Visit: Payer: Self-pay | Admitting: Endocrinology

## 2018-04-24 MED ORDER — BASAGLAR KWIKPEN 100 UNIT/ML ~~LOC~~ SOPN: 30.0000 [IU] | PEN_INJECTOR | SUBCUTANEOUS | 11 refills | Status: DC

## 2018-05-11 ENCOUNTER — Encounter (INDEPENDENT_AMBULATORY_CARE_PROVIDER_SITE_OTHER): Payer: Self-pay | Admitting: Ophthalmology

## 2018-05-12 ENCOUNTER — Telehealth: Payer: Self-pay | Admitting: Cardiology

## 2018-05-12 NOTE — Telephone Encounter (Signed)
New Message    Pt c/o BP issue: STAT if pt c/o blurred vision, one-sided weakness or slurred speech  1. What are your last 5 BP readings? She doesn't have the last reading but says her BP has been high   2. Are you having any other symptoms (ex. Dizziness, headache, blurred vision, passed out)? Blurred Vision   3. What is your BP issue? Pt would like to schedule her exercise test because she says her BP has been high and she is on medication for her Bp. She also said she is now having some blurred vision

## 2018-05-12 NOTE — Telephone Encounter (Signed)
I spoke to the patient because she is concerned about her BP.  She said that it has been high, but does not have readings to indicate that.  She has no CP or SOB associated with this, but does have blurred vision, which she relates to eye surgery that she had recently. She said there is swelling in her feet.   She was going out on her lunch break to purchase a BP machine and will call us this afternoon with readings.

## 2018-05-12 NOTE — Telephone Encounter (Signed)
Follow up   Patient is returning call with b/p readings:  Today: 147/102    147/94

## 2018-05-15 NOTE — Telephone Encounter (Signed)
Please find out what her HR is associated with the BP readings.  Also find out if there is anyway she is pregnant as this affects what meds we choose to treat her BP

## 2018-05-15 NOTE — Telephone Encounter (Signed)
Spoke with the patient, she stated that her heart rates were 102 and 99. She also has not noticed any change since being on aldactone. She expressed understanding about Dr. Theodosia Blender recommendations.

## 2018-05-15 NOTE — Telephone Encounter (Signed)
Please find out if there is any way she is pregnant or if she is planning on getting pregnant

## 2018-05-15 NOTE — Telephone Encounter (Signed)
Please have her check her BP daily for a week and call with the results. Also needs to follow a 2gm Na diet and no added salt

## 2018-05-16 MED ORDER — CARVEDILOL 6.25 MG PO TABS: 6.2500 mg | ORAL_TABLET | Freq: Two times a day (BID) | ORAL | 3 refills | Status: DC

## 2018-05-16 NOTE — Telephone Encounter (Signed)
No she is not pregnant is not planning on having children currently.

## 2018-05-16 NOTE — Telephone Encounter (Signed)
Add carvedilol 6.25mg  BID and check BP and HR daily for a week and call with results.  Please instruct her that she needs to call immediately if she plans to or gets pregnant as some of the BP meds she is on would harm the fetus

## 2018-05-16 NOTE — Telephone Encounter (Signed)
Spoke with the patient, she expressed understanding about contacting the office if she is trying to become pregnant. The patient is not breastfeeding. She is going to send a MyChart with the blood pressure and heart rate results.

## 2018-05-23 ENCOUNTER — Ambulatory Visit: Payer: Self-pay | Admitting: Endocrinology

## 2018-05-24 ENCOUNTER — Other Ambulatory Visit: Payer: Self-pay

## 2018-05-24 ENCOUNTER — Ambulatory Visit: Payer: BC Managed Care – PPO | Admitting: Endocrinology

## 2018-05-24 ENCOUNTER — Encounter: Payer: Self-pay | Admitting: Endocrinology

## 2018-05-24 VITALS — BP 128/74 | HR 100 | Ht 66.0 in | Wt 168.0 lb

## 2018-05-24 DIAGNOSIS — E10649 Type 1 diabetes mellitus with hypoglycemia without coma: Secondary | ICD-10-CM | POA: Diagnosis not present

## 2018-05-24 DIAGNOSIS — E109 Type 1 diabetes mellitus without complications: Secondary | ICD-10-CM

## 2018-05-24 LAB — POCT GLYCOSYLATED HEMOGLOBIN (HGB A1C): Hemoglobin A1C: 12 % — AB (ref 4.0–5.6)

## 2018-05-24 MED ORDER — INSULIN ASPART 100 UNIT/ML ~~LOC~~ SOLN: SUBCUTANEOUS | 11 refills | Status: DC

## 2018-05-24 NOTE — Progress Notes (Signed)
Subjective:    Patient ID: Barbara Donovan, female    DOB: 1990/08/01, 28 y.o.   MRN: [id number]  HPI Pt returns for f/u of diabetes mellitus: DM type: 1 Dx'ed: 8242 Complications: none Therapy: insulin since 2009 DKA: last episode was 2015.  Severe hypoglycemia: never Pancreatitis: never Pancreatic imaging: never.  Other: she takes multiple daily injections; she took pump rx during 2019 pregnancy, but not continuous glucose monitor, due to cost.  Interval history:  She is 5 mos postpartum.  She is not at risk for pregnancy now. no cbg record, but states cbg varies from 65-300, but most are in the 200's.  There is no trend throughout the day.  She wants to resume the pump (tandem).  pt states she feels well in general.     Past Medical History:  Diagnosis Date  . Asthma 10/30/2013  . Chronic hypertension during pregnancy, antepartum 08/19/2017  . Dehydration 01/28/2018  . Depression during pregnancy, antepartum 07/07/2017   6/20: Short trial of zoloft previously, reports didn't help much but also didn't give it a chance Discussed r/b/a SSRIs in pregnancy, agrees to try Zoloft again, rx sent No SI/HI/red flags  . Diabetes ([place])    TYPE I  . 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  . Hypokalemia 01/22/2018  . Hypomagnesemia 01/28/2018  . Intractable nausea and vomiting 01/22/2018  . Intrauterine growth restriction (IUGR) affecting care of mother 12/22/2017  . Morbid obesity ([place])   . 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 Severe hyperemesis gravidarum 10/30/2017  . Type I diabetes mellitus ([place]) 07/07/2017   Current Diabetic Medications:  Insulin  [x]  Aspirin 81 mg daily after 12 weeks (? A2/B GDM)  Required Referrals for A1GDM or A2GDM: [x]  Diabetes Education and Testing Supplies [x]  Nutrition Cousult  For A2/B GDM  or higher classes of DM [x]  Diabetes Education and Testing Supplies [x]  Nutrition Counsult [x]  Fetal ECHO after 20 weeks  [x]  Eye exam for retina evaluation - severe retinopathy 7/19  Base  . Ventricular septal defect (VSD) of fetus in singleton pregnancy, antepartum 09/30/2017   May go to newborn nursery per Dr. Lenard Simmer Echo prior to discharge    Past Surgical History:  Procedure Laterality Date  . CESAREAN SECTION N/A 12/26/2017   Procedure: CESAREAN SECTION;  Surgeon: Osborne Oman, MD;  Location: [place];  Service: Obstetrics;  Laterality: N/A;  . NO PAST SURGERIES    . WISDOM TOOTH EXTRACTION      Social History   Socioeconomic History  . Marital status: Single    Spouse name: Not on file  . Number of children: Not on file  . Years of education: Not on file  . Highest education level: Not on file  Occupational History  . Not on file  Social Needs  . Financial resource strain: Not on file  . Food insecurity:    Worry: Not on file    Inability: Not on file  . Transportation needs:    Medical: Not on file    Non-medical: Not on file  Tobacco Use  . Smoking status: Never Smoker  . Smokeless tobacco: Never Used  Substance and Sexual Activity  . Alcohol use: Not Currently    Comment: SOCIALLY  . Drug use: Never  . Sexual activity: Not Currently    Birth control/protection: None  Lifestyle  .  Physical activity:    Days per week: Not on file    Minutes per session: Not on file  . Stress: Not on file  Relationships  . Social connections:    Talks on phone: Not on file    Gets together: Not on file    Attends religious service: Not on file    Active member of club or organization: Not on file    Attends meetings of clubs or organizations: Not on file    Relationship status: Not on file  . Intimate partner violence:    Fear of current or ex partner: Not on file    Emotionally abused: Not on file    Physically abused: Not on file    Forced sexual activity:  Not on file  Other Topics Concern  . Not on file  Social History Narrative  . Not on file    Current Outpatient Medications on File Prior to Visit  Medication Sig Dispense Refill  . amLODipine (NORVASC) 5 MG tablet Take 1 tablet (5 mg total) by mouth daily. For Blood Pressure 30 tablet 1  . carvedilol (COREG) 6.25 MG tablet Take 1 tablet (6.25 mg total) by mouth 2 (two) times daily. 180 tablet 3  . ferrous sulfate 325 (65 FE) MG tablet Take 1 tablet (325 mg total) by mouth 2 (two) times daily with a meal. 60 tablet 3  . Insulin Pen Needle (PEN NEEDLES) 32G X 5 MM MISC 1 Device by Does not apply route 4 (four) times daily. 120 each 11  . spironolactone (ALDACTONE) 25 MG tablet Take 1 tablet (25 mg total) by mouth daily. 90 tablet 3   No current facility-administered medications on file prior to visit.     No Known Allergies  Family History  Problem Relation Age of Onset  . Diabetes Mother   . Aneurysm Mother 43  . Diabetes Father   . Cataracts Father   . Healthy Sister   . Healthy Brother   . Healthy Daughter   . Amblyopia Neg Hx   . Blindness Neg Hx   . Glaucoma Neg Hx   . Macular degeneration Neg Hx   . Retinal detachment Neg Hx   . Strabismus Neg Hx   . Retinitis pigmentosa Neg Hx     BP 128/74 (BP Location: Left Arm, Patient Position: Sitting, Cuff Size: Normal)   Pulse 100   Ht 5\' 6"  (1.676 m)   Wt 168 lb (76.2 kg)   SpO2 97%   BMI 27.12 kg/m    Review of Systems She denies LOC.      Objective:   Physical Exam VITAL SIGNS:  See vs page.  GENERAL: no distress.  Pulses: dorsalis pedis intact bilat.   MSK: no deformity of the feet CV: trace bilat leg edema.  Skin:  no ulcer on the feet, but there is an abrasion at the right med malleolus.  normal color and temp on the feet. Neuro: sensation is intact to touch on the feet.     Lab Results  Component Value Date   HGBA1C 12.0 (A) 05/24/2018      Assessment & Plan:  Type 1 DM: worse Hypoglycemia: This  limits aggressiveness of glycemic control.  She should resume pump rx.     Patient Instructions  When you resume the pump, please take these settings: basal rate of 0.7 units/hr.   bolus of 1 unit/25 grams carbohydrate correction bolus (which some people call "sensitivity," or "insulin sensitivity ratio," or just "  isr") of 1 unit for each by which your glucose exceeds 100.   We'll get and send back a new form for supplies. I have sent a prescription to your pharmacy, for the novolog.  Please start the pump on a Monday. Please come back a few days after restarting the pump.

## 2018-05-24 NOTE — Progress Notes (Signed)
ae

## 2018-05-24 NOTE — Patient Instructions (Addendum)
When you resume the pump, please take these settings: basal rate of 0.7 units/hr.   bolus of 1 unit/25 grams carbohydrate correction bolus (which some people call "sensitivity," or "insulin sensitivity ratio," or just "isr") of 1 unit for each by which your glucose exceeds 100.   We'll get and send back a new form for supplies. I have sent a prescription to your pharmacy, for the novolog.  Please start the pump on a Monday. Please come back a few days after restarting the pump.

## 2018-05-29 NOTE — Progress Notes (Addendum)
[facility] Note  05/30/2018     CHIEF COMPLAINT Patient presents for Retina Follow Up   HISTORY OF PRESENT ILLNESS: Barbara Donovan is a 28 y.o. female who presents to the clinic today for:   HPI    Retina Follow Up    Patient presents with  Diabetic Retinopathy.  In left eye.  Severity is severe.  Duration of 5.5 months.  Since onset it is gradually worsening.  I, the attending physician,  performed the HPI with the patient and updated documentation appropriately.          Comments    Patient c/o worsening vision OS since last visit 11.26.19. Had PRP OS and hasn't been able to see well since PRP OS. Sees floaters in vision OS. Denies flashes. Vision seems good OD. BS is 83 today-checked by patient in office today. Last a1c level was 12 last week. Patient just started insulin pump a few days ago.        Last edited by Bernarda Caffey, MD on 05/30/2018  9:21 PM. (History)    pt states she noticed a decrease in her vision at the beginning of December, she states her baby was born 66 weeks early and spent 60 days in the NICU, she states he is scheduled for open heart sx on Monday at [facility], pt states her vision has been very blurry, but has not changed much since December, she states she is just now getting all her appts back on track after having her baby, pt states she is using her insulin pump again, she states she stopped for a little while when she was pregnant bc her blood sugar would drop too low  Referring physician: No referring provider defined for this encounter.  HISTORICAL INFORMATION:   Selected notes from the MEDICAL RECORD NUMBER Referred by Dr. [name] for concern of bilateral CRVO LEE-  Ocular Hx-  PMH-     CURRENT MEDICATIONS: No current outpatient medications on file. (Ophthalmic Drugs)   No current facility-administered medications for this visit.  (Ophthalmic Drugs)   Current Outpatient Medications (Other)  Medication Sig  .  amLODipine (NORVASC) 5 MG tablet Take 1 tablet (5 mg total) by mouth daily. For Blood Pressure  . carvedilol (COREG) 6.25 MG tablet Take 1 tablet (6.25 mg total) by mouth 2 (two) times daily.  . ferrous sulfate 325 (65 FE) MG tablet Take 1 tablet (325 mg total) by mouth 2 (two) times daily with a meal.  . insulin aspart (NOVOLOG) 100 UNIT/ML injection For use in pump, total of 60 units per day.  . Insulin Pen Needle (PEN NEEDLES) 32G X 5 MM MISC 1 Device by Does not apply route 4 (four) times daily.  Marland Kitchen spironolactone (ALDACTONE) 25 MG tablet Take 1 tablet (25 mg total) by mouth daily.   No current facility-administered medications for this visit.  (Other)      REVIEW OF SYSTEMS: ROS    Positive for: Endocrine, Cardiovascular, Eyes   Negative for: Constitutional, Gastrointestinal, Neurological, Skin, Genitourinary, Musculoskeletal, HENT, Respiratory, Psychiatric, Allergic/Imm, Heme/Lymph   Last edited by Roselee Nova D on 05/30/2018  2:00 PM. (History)       ALLERGIES No Known Allergies  PAST MEDICAL HISTORY Past Medical History:  Diagnosis Date  . Asthma 10/30/2013  . Chronic hypertension during pregnancy, antepartum 08/19/2017  . Dehydration 01/28/2018  . Depression during pregnancy, antepartum 07/07/2017   6/20: Short trial of zoloft previously, reports didn't help much but  also didn't give it a chance Discussed r/b/a SSRIs in pregnancy, agrees to try Zoloft again, rx sent No SI/HI/red flags  . Diabetes ([place])    TYPE I  . 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  . Hypokalemia 01/22/2018  . Hypomagnesemia 01/28/2018  . Intractable nausea and vomiting 01/22/2018  . Intrauterine growth restriction (IUGR) affecting care of mother 12/22/2017  . Morbid obesity ([place])   . 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  Severe hyperemesis gravidarum 10/30/2017  . Type I diabetes mellitus ([place]) 07/07/2017   Current Diabetic Medications:  Insulin  [x]  Aspirin 81 mg daily after 12 weeks (? A2/B GDM)  Required Referrals for A1GDM or A2GDM: [x]  Diabetes Education and Testing Supplies [x]  Nutrition Cousult  For A2/B GDM or higher classes of DM [x]  Diabetes Education and Testing Supplies [x]  Nutrition Counsult [x]  Fetal ECHO after 20 weeks  [x]  Eye exam for retina evaluation - severe retinopathy 7/19  Base  . Ventricular septal defect (VSD) of fetus in singleton pregnancy, antepartum 09/30/2017   May go to newborn nursery per Dr. Lenard Simmer Echo prior to discharge   Past Surgical History:  Procedure Laterality Date  . CESAREAN SECTION N/A 12/26/2017   Procedure: CESAREAN SECTION;  Surgeon: Osborne Oman, MD;  Location: [facility];  Service: Obstetrics;  Laterality: N/A;  . NO PAST SURGERIES    . WISDOM TOOTH EXTRACTION      FAMILY HISTORY Family History  Problem Relation Age of Onset  . Diabetes Mother   . Aneurysm Mother 75  . Diabetes Father   . Cataracts Father   . Healthy Sister   . Healthy Brother   . Healthy Daughter   . Amblyopia Neg Hx   . Blindness Neg Hx   . Glaucoma Neg Hx   . Macular degeneration Neg Hx   . Retinal detachment Neg Hx   . Strabismus Neg Hx   . Retinitis pigmentosa Neg Hx     SOCIAL HISTORY Social History   Tobacco Use  . Smoking status: Never Smoker  . Smokeless tobacco: Never Used  Substance Use Topics  . Alcohol use: Not Currently    Comment: SOCIALLY  . Drug use: Never         OPHTHALMIC EXAM:  Base Eye Exam    Visual Acuity (Snellen - Linear)      Right Left   Dist cc 20/30 -2 CF@face    Dist ph cc 20/25 -1 NI  No reflex OS.       Tonometry (Tonopen, 2:14 PM)      Right Left   Pressure 13 12       Pupils      Dark Light Shape React APD   Right 4 3 Round Slow None   Left 5 4.5 Round Sluggish None       Visual Fields (Counting fingers)       Left Right    Full Full  Can see movement peripherally OS.       Extraocular Movement      Right Left    Full, Ortho Full, Ortho       Neuro/Psych    Oriented x3:  Yes   Mood/Affect:  Normal       Dilation    Both eyes:  1.0% Mydriacyl, 2.5% Phenylephrine @ 2:14 PM  Slit Lamp and Fundus Exam    Slit Lamp Exam      Right Left   Lids/Lashes Normal Normal   Conjunctiva/Sclera White and quiet mild Melanosis   Cornea 1+ Punctate epithelial erosions Clear   Anterior Chamber Deep and quiet Deep and quiet   Iris Round and dilated, No NVI Round and dilated, No NVI   Lens trace Nuclear sclerosis and cortical changes Trace Nuclear sclerosis and cortical changes   Vitreous trace pigment in anterior vitreous, oldl VH settling inferiorly +pigment in anterior vitreous       Fundus Exam      Right Left   Disc Pink and Sharp; +heme on nasal side, +NVD superiorly and nasally Massive fibrosis and NVD obscuring disc   C/D Ratio 0.5    Macula Flat, Blunted foveal reflex, subhyaloid heme along IT arcades No view; +TRD   Vessels Vascular attenuation, Tortuous, +NVE nasally Hazy view; +NVD/NVE   Periphery Attached, scattered DBH, nasal subhyaloid heme just off disc Hazy view; obscured by [facility] and vitreous condensations and fibrosis        Refraction    Wearing Rx      Sphere Cylinder Axis   Right -1.75 +0.25 012   Left -1.75 +0.50 002       Manifest Refraction      Sphere Cylinder Axis Dist VA   Right -1.75 +0.50 012 20/25-1   Left -1.75 +0.50 180 CF          IMAGING AND PROCEDURES  Imaging and Procedures for @TODAY @  OCT, Retina - OU - Both Eyes       Right Eye Quality was good. Central Foveal Thickness: 230. Progression has improved. Findings include no SRF, intraretinal hyper-reflective material, no IRF, normal foveal contour, vitreous traction, macular pucker, epiretinal membrane (Interval resolution of IRF).   Left Eye Quality was poor. Progression has  worsened. Findings include abnormal foveal contour, preretinal fibrosis, epiretinal membrane, vitreous traction.   Notes *Images captured and stored on drive  Diagnosis / Impression:  PDR OU OD: mild preretinal fibrosis/ERM/pucker but interval improvement in IRF and foveal contour; +subhyaloid heme inferiorly OS: poor image -- thick preretinal fibrosis/plaque near nerve creating traction and retinal edema; remainder of scans obscured by vitreous opacities  Clinical management:  See below  Abbreviations: NFP - Normal foveal profile. CME - cystoid macular edema. PED - pigment epithelial detachment. IRF - intraretinal fluid. SRF - subretinal fluid. EZ - ellipsoid zone. ERM - epiretinal membrane. ORA - outer retinal atrophy. ORT - outer retinal tubulation. SRHM - subretinal hyper-reflective material         Color Fundus Photography Optos - OU - Both Eyes       Right Eye Progression has worsened. Disc findings include neovascularization. Macula : hemorrhage, flat. Vessels : Neovascularization, tortuous vessels, attenuated (Early fibrotic NVE superonasal to disc). Periphery : hemorrhage.   Left Eye Progression has worsened. Disc findings include neovascularization (Obscured by neovascular plaque). Macula : detached (Obscured by fibrosis and VH). Vessels : Neovascularization. Periphery : hemorrhage.   Notes **Images stored on drive**  Impression: OD: subhyaloid hemes along IT arcades and nasal to disc; +NVE/NVD; scattered IRH periphery OS:  Central fibrotic NV overlying disc; diffuse VH and fibrosis obscuring retina; +TRD                 ASSESSMENT/PLAN:    ICD-10-CM   1. 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 Vitreous hemorrhage of left eye (HCC) H43.12   4. Retinal  edema H35.81 OCT, Retina - OU - Both Eyes  5. Essential hypertension I10   6. Hypertensive retinopathy of both eyes H35.033   7. Nuclear sclerosis of both eyes H25.13     1-4. 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 (OS > OD)  - s/p PRP OS (11.26.19)  - lost to f/u following 11.26.19 appt due to complicated pregnancy, premature birth of baby w/ extended NICU stay and congenital heart defect, and post-partum health issues  - today, presents with 5-6 mo history of vision loss OS (since December 2019)  - on today's exam:   OD - interval improvement in CRVO w/ edema, but + NVD/NVE and new subhyaloid heme about the disc and along inf temp arcades   OS - florid, plaque-like NVD with traction and diffuse old VH  - OCT shows interval resolution of IRF OD; OS with very limited view  - BCVA OD improved to 20/25-1; OS decreased to CF at face, down from 20/80  - discussed findings, severity of disease, importance of adherence to f/u, and guarded prognosis  - discussed likely need for PPV w/ membrane peel +/- silicon oil OS -- pt to consider timing due to health issues / upcoming surgery of infant son  - recommend  PRP OD today, 05.12.2020 -- pt unable to undergo procedure today and wishes to come back for laser  - f/u within 1-2 wks for PRP laser OD and surgical planning OS   5,6. Hypertensive retinopathy OU  - there was concern for pregnancy related hypertension (I.e. Pre-eclampsia, Eclampsia, HELLP, etc) at last visit  - also at last visit, had bilateral CRVO w/ CME (OD > OS) s/p PRP OS  - discussed importance of tight BP control  - monitor  7. Nuclear sclerosis OU - The symptoms of cataract, surgical options, and treatments and risks were discussed with patient. - discussed diagnosis and progression - not yet visually significant - monitor for now   Ophthalmic Meds Ordered this visit:  No orders of the defined types were placed in this encounter.     Return for 1-2 wks,  Dilated Exam, OCT, Laser.  There are no Patient Instructions on file for this visit.   Explained the diagnoses, plan, and follow up with the patient and they expressed understanding.  Patient expressed understanding of the importance of proper follow up care.  This document serves as a record of services personally performed by Gardiner Sleeper, MD, PhD. It was created on their behalf by Ernest Mallick, OA, an ophthalmic assistant. The creation of this record is the provider's dictation and/or activities during the visit.    Electronically signed by: Ernest Mallick, OA  05.11.2020 9:00 AM   Gardiner Sleeper, M.D., Ph.D. Diseases & Surgery of the Retina and Vitreous [facility]   I have reviewed the above documentation for accuracy and completeness, and I agree with the above. Gardiner Sleeper, M.D., Ph.D. 05/31/18 9:00 AM      Abbreviations: M myopia (nearsighted); A astigmatism; H hyperopia (farsighted); P presbyopia; Mrx spectacle prescription;  CTL contact lenses; OD right eye; OS left eye; OU both eyes  XT exotropia; ET esotropia; PEK punctate epithelial keratitis; PEE punctate epithelial erosions; DES dry eye syndrome; MGD meibomian gland dysfunction;  ATs artificial tears; PFAT's preservative free artificial tears; [place] nuclear sclerotic cataract; PSC posterior subcapsular cataract; ERM epi-retinal membrane; PVD posterior vitreous detachment; RD retinal detachment; DM diabetes mellitus; DR diabetic retinopathy; NPDR non-proliferative diabetic retinopathy; PDR proliferative diabetic retinopathy; CSME clinically significant macular edema; DME diabetic macular edema; dbh dot blot hemorrhages; CWS cotton wool spot; POAG primary open angle glaucoma; C/D cup-to-disc ratio; HVF humphrey visual field; GVF goldmann visual field; OCT optical coherence tomography; IOP intraocular pressure; BRVO Branch retinal vein occlusion; CRVO central retinal vein occlusion; CRAO central retinal  artery occlusion; BRAO branch retinal artery occlusion; RT retinal tear; SB scleral buckle; PPV pars plana vitrectomy; VH Vitreous hemorrhage; PRP panretinal laser photocoagulation; IVK intravitreal kenalog; VMT vitreomacular traction; MH Macular hole;  NVD neovascularization of the disc; NVE neovascularization elsewhere; AREDS age related eye disease study; ARMD age related macular degeneration; POAG primary open angle glaucoma; EBMD epithelial/anterior basement membrane dystrophy; ACIOL anterior chamber intraocular lens; IOL intraocular lens; PCIOL posterior chamber intraocular lens; Phaco/IOL phacoemulsification with intraocular lens placement; [place] photorefractive keratectomy; LASIK laser assisted in situ keratomileusis; HTN hypertension; DM diabetes mellitus; COPD chronic obstructive pulmonary disease

## 2018-05-30 ENCOUNTER — Ambulatory Visit (INDEPENDENT_AMBULATORY_CARE_PROVIDER_SITE_OTHER): Payer: BC Managed Care – PPO | Admitting: Ophthalmology

## 2018-05-30 ENCOUNTER — Other Ambulatory Visit: Payer: Self-pay

## 2018-05-30 ENCOUNTER — Encounter (INDEPENDENT_AMBULATORY_CARE_PROVIDER_SITE_OTHER): Payer: Self-pay | Admitting: Ophthalmology

## 2018-05-30 DIAGNOSIS — H3342 Traction detachment of retina, left eye: Secondary | ICD-10-CM | POA: Diagnosis not present

## 2018-05-30 DIAGNOSIS — H4312 Vitreous hemorrhage, left eye: Secondary | ICD-10-CM

## 2018-05-30 DIAGNOSIS — I10 Essential (primary) hypertension: Secondary | ICD-10-CM

## 2018-05-30 DIAGNOSIS — H3581 Retinal edema: Secondary | ICD-10-CM

## 2018-05-30 DIAGNOSIS — H2513 Age-related nuclear cataract, bilateral: Secondary | ICD-10-CM

## 2018-05-30 DIAGNOSIS — E103513 Type 1 diabetes mellitus with proliferative diabetic retinopathy with macular edema, bilateral: Secondary | ICD-10-CM | POA: Diagnosis not present

## 2018-05-30 DIAGNOSIS — H35033 Hypertensive retinopathy, bilateral: Secondary | ICD-10-CM

## 2018-06-06 ENCOUNTER — Encounter (INDEPENDENT_AMBULATORY_CARE_PROVIDER_SITE_OTHER): Payer: Self-pay | Admitting: Ophthalmology

## 2018-06-07 ENCOUNTER — Telehealth: Payer: Self-pay

## 2018-06-07 ENCOUNTER — Other Ambulatory Visit: Payer: Self-pay

## 2018-06-07 NOTE — Telephone Encounter (Signed)
Pt called after hours to request refill of her cartridges and tubing for her Tandem. Called pt and left detailed VM advising pt to call DME supplier for her request.

## 2018-06-09 ENCOUNTER — Telehealth: Payer: Self-pay | Admitting: Endocrinology

## 2018-06-09 NOTE — Telephone Encounter (Signed)
Called pt and informed her to contact her DME supplier (examples: [facility], [facility], [facility], etc). All refill requests will need to be handled through her DME supplier. Verbalized acceptance and understanding.

## 2018-06-09 NOTE — Telephone Encounter (Signed)
Patients states she is out of the following: cartridges that hold the insulin and the tube that delivers insulin to the patient (meter is a Tandem). Pharmacy told patient that they do not handle the above supplies. Patient called the Pharmacy help desk but no one answered the phone-automated system did not work for patient.. Patient states [place] Reader helped her with the above before and patient is not sure how to proceed to receive the above supplies. Please call patient at ph# [phone] to advise.

## 2018-06-16 ENCOUNTER — Encounter (INDEPENDENT_AMBULATORY_CARE_PROVIDER_SITE_OTHER): Payer: Self-pay

## 2018-06-16 ENCOUNTER — Encounter (INDEPENDENT_AMBULATORY_CARE_PROVIDER_SITE_OTHER): Payer: BC Managed Care – PPO | Admitting: Ophthalmology

## 2018-06-16 ENCOUNTER — Other Ambulatory Visit: Payer: Self-pay

## 2018-06-19 ENCOUNTER — Ambulatory Visit (INDEPENDENT_AMBULATORY_CARE_PROVIDER_SITE_OTHER): Payer: BC Managed Care – PPO | Admitting: Ophthalmology

## 2018-06-19 ENCOUNTER — Encounter (INDEPENDENT_AMBULATORY_CARE_PROVIDER_SITE_OTHER): Payer: Self-pay | Admitting: Ophthalmology

## 2018-06-19 ENCOUNTER — Other Ambulatory Visit: Payer: Self-pay

## 2018-06-19 DIAGNOSIS — H35033 Hypertensive retinopathy, bilateral: Secondary | ICD-10-CM

## 2018-06-19 DIAGNOSIS — H2513 Age-related nuclear cataract, bilateral: Secondary | ICD-10-CM

## 2018-06-19 DIAGNOSIS — E103513 Type 1 diabetes mellitus with proliferative diabetic retinopathy with macular edema, bilateral: Secondary | ICD-10-CM | POA: Diagnosis not present

## 2018-06-19 DIAGNOSIS — H3342 Traction detachment of retina, left eye: Secondary | ICD-10-CM | POA: Diagnosis not present

## 2018-06-19 DIAGNOSIS — H4312 Vitreous hemorrhage, left eye: Secondary | ICD-10-CM | POA: Diagnosis not present

## 2018-06-19 DIAGNOSIS — H3581 Retinal edema: Secondary | ICD-10-CM

## 2018-06-19 DIAGNOSIS — I10 Essential (primary) hypertension: Secondary | ICD-10-CM

## 2018-06-19 MED ORDER — PREDNISOLONE ACETATE 1 % OP SUSP: 1.0000 [drp] | Freq: Four times a day (QID) | OPHTHALMIC | 0 refills | Status: AC

## 2018-06-19 NOTE — Progress Notes (Signed)
[name] Note  06/19/2018     CHIEF COMPLAINT Patient presents for Retina Follow Up   HISTORY OF PRESENT ILLNESS: Barbara Donovan is a 28 y.o. female who presents to the clinic today for:   HPI    Retina Follow Up    In right eye.  Severity is moderate.  Duration of 3 weeks.  Since onset it is stable.  I, the attending physician,  performed the HPI with the patient and updated documentation appropriately.          Comments    Patient states vision the same OS.  Here for PRP OD today. BS was 146 this afternoon. Last a1c was 12 about 1 month ago.        Last edited by Barbara Caffey, MD on 06/19/2018  4:07 PM. (History)    Pt here for PRP OD -- no changes in vision. Wishes to move forward with planning surgery for OS. Son underwent cardiac surgery and is recovering well -- scheduled to return to day care next week.  Referring physician: No referring provider defined for this encounter.  HISTORICAL INFORMATION:   Selected notes from the MEDICAL RECORD NUMBER Referred by Dr. Marigene Donovan for concern of bilateral CRVO LEE-  Ocular Hx-  PMH-     CURRENT MEDICATIONS: Current Outpatient Medications (Ophthalmic Drugs)  Medication Sig  . prednisoLONE acetate (PRED FORTE) 1 % ophthalmic suspension Place 1 drop into the right eye 4 (four) times daily for 7 days.   No current facility-administered medications for this visit.  (Ophthalmic Drugs)   Current Outpatient Medications (Other)  Medication Sig  . amLODipine (NORVASC) 5 MG tablet Take 1 tablet (5 mg total) by mouth daily. For Blood Pressure  . carvedilol (COREG) 6.25 MG tablet Take 1 tablet (6.25 mg total) by mouth 2 (two) times daily.  . ferrous sulfate 325 (65 FE) MG tablet Take 1 tablet (325 mg total) by mouth 2 (two) times daily with a meal.  . insulin aspart (NOVOLOG) 100 UNIT/ML injection For use in pump, total of 60 units per day.  . Insulin Pen Needle (PEN NEEDLES) 32G X 5 MM MISC 1 Device  by Does not apply route 4 (four) times daily.  Marland Kitchen spironolactone (ALDACTONE) 25 MG tablet Take 1 tablet (25 mg total) by mouth daily.   No current facility-administered medications for this visit.  (Other)      REVIEW OF SYSTEMS: ROS    Positive for: Endocrine, Cardiovascular, Eyes   Negative for: Constitutional, Gastrointestinal, Neurological, Skin, Genitourinary, Musculoskeletal, HENT, Respiratory, Psychiatric, Allergic/Imm, Heme/Lymph   Last edited by Barbara Donovan D on 06/19/2018  2:09 PM. (History)       ALLERGIES No Known Allergies  PAST MEDICAL HISTORY Past Medical History:  Diagnosis Date  . Asthma 10/30/2013  . Chronic hypertension during pregnancy, antepartum 08/19/2017  . Dehydration 01/28/2018  . Depression during pregnancy, antepartum 07/07/2017   6/20: Short trial of zoloft previously, reports didn't help much but also didn't give it a chance Discussed r/b/a SSRIs in pregnancy, agrees to try Zoloft again, rx sent No SI/HI/red flags  . Diabetes ([place])    TYPE I  . Diabetic retinopathy ([place]) 06/09/2017   07/2017 with bilateral severe diabetic non-proliferative retinopathy with macular edema.  . Hypokalemia 01/22/2018  . Hypomagnesemia 01/28/2018  . Intractable nausea and vomiting 01/22/2018  . Intrauterine growth restriction (IUGR) affecting care of mother 12/22/2017  . Morbid obesity ([place])   . 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 Proteinuria due to type 1 diabetes mellitus ([place]) 11/02/2017   Baseline Pr: Cr 10.23  . Severe hyperemesis gravidarum 10/30/2017  . Type I diabetes mellitus ([place]) 07/07/2017   Current Diabetic Medications:  Insulin  [x]  Aspirin 81 mg daily after 12 weeks (? A2/B GDM)  Required Referrals for A1GDM or A2GDM: [x]  Diabetes Education and Testing Supplies [x]  Nutrition Cousult  For A2/B GDM or higher classes of DM [x]  Diabetes Education and Testing Supplies [x]  Nutrition Counsult [x]  Fetal ECHO after 20 weeks  [x]  Eye exam for retina evaluation - severe  retinopathy 7/19  Base  . Ventricular septal defect (VSD) of fetus in singleton pregnancy, antepartum 09/30/2017   May go to newborn nursery per Dr. Lenard Simmer Echo prior to discharge   Past Surgical History:  Procedure Laterality Date  . CESAREAN SECTION N/A 12/26/2017   Procedure: CESAREAN SECTION;  Surgeon: Osborne Oman, MD;  Location: [facility];  Service: Obstetrics;  Laterality: N/A;  . NO PAST SURGERIES    . WISDOM TOOTH EXTRACTION      FAMILY HISTORY Family History  Problem Relation Age of Onset  . Diabetes Mother   . Aneurysm Mother 47  . Diabetes Father   . Cataracts Father   . Healthy Sister   . Healthy Brother   . Healthy Daughter   . Amblyopia Neg Hx   . Blindness Neg Hx   . Glaucoma Neg Hx   . Macular degeneration Neg Hx   . Retinal detachment Neg Hx   . Strabismus Neg Hx   . Retinitis pigmentosa Neg Hx     SOCIAL HISTORY Social History   Tobacco Use  . Smoking status: Never Smoker  . Smokeless tobacco: Never Used  Substance Use Topics  . Alcohol use: Not Currently    Comment: SOCIALLY  . Drug use: Never         OPHTHALMIC EXAM:  Base Eye Exam    Visual Acuity (Snellen - Linear)      Right Left   Dist cc 20/30 CF at 2'   Dist ph cc 20/25 +1 NI   Correction:  Glasses       Tonometry (Tonopen, 2:15 PM)      Right Left   Pressure 19 15       Pupils      Dark Light Shape React APD   Right 4 3 Round Slow None   Left 5 4.5 Round Sluggish +1       Visual Fields (Counting fingers)      Left Right     Full   Restrictions Partial outer superior temporal, inferior temporal, superior nasal, inferior nasal deficiencies        Neuro/Psych    Oriented x3:  Yes   Mood/Affect:  Normal       Dilation    Both eyes:  1.0% Mydriacyl, 2.5% Phenylephrine @ 2:15 PM        Slit Lamp and Fundus Exam    Slit Lamp Exam      Right Left   Lids/Lashes Normal Normal   Conjunctiva/Sclera White and quiet mild Melanosis   Cornea 1+ Punctate  epithelial erosions Clear   Anterior Chamber Deep and quiet Deep and quiet   Iris Round and dilated, No NVI Round and dilated, No NVI   Lens trace Nuclear sclerosis and cortical changes Trace Nuclear sclerosis and cortical changes   Vitreous trace pigment in anterior vitreous, oldl VH settling inferiorly +pigment in anterior  vitreous       Fundus Exam      Right Left   Disc Pink and Sharp; +heme on nasal side, +NVD superiorly and nasally Massive fibrosis and NVD obscuring disc   C/D Ratio 0.5    Macula Flat, Blunted foveal reflex, subhyaloid heme along IT arcades No view; +TRD   Vessels Vascular attenuation, Tortuous, +NVE nasally Hazy view; +NVD/NVE   Periphery Attached, scattered DBH, nasal subhyaloid heme just off disc Hazy view; obscured by [facility] and vitreous condensations and fibrosis        Refraction    Wearing Rx      Sphere Cylinder Axis   Right -1.75 +0.25 012   Left -1.75 +0.50 002          IMAGING AND PROCEDURES  Imaging and Procedures for @TODAY @  OCT, Retina - OU - Both Eyes       Right Eye Quality was good. Central Foveal Thickness: 227. Progression has been stable. Findings include no SRF, intraretinal hyper-reflective material, no IRF, normal foveal contour, vitreous traction, macular pucker, epiretinal membrane (Interval resolution of IRF).   Left Eye Quality was poor. Progression has been stable. Findings include abnormal foveal contour, preretinal fibrosis, epiretinal membrane, vitreous traction.   Notes *Images captured and stored on drive  Diagnosis / Impression:  PDR OU OD: mild preretinal fibrosis/ERM/pucker but interval improvement in IRF and foveal contour; +subhyaloid heme inferiorly OS: poor image -- thick preretinal fibrosis/plaque near nerve creating traction and retinal edema; remainder of scans obscured by vitreous opacities  Clinical management:  See below  Abbreviations: NFP - Normal foveal profile. CME - cystoid macular edema. PED -  pigment epithelial detachment. IRF - intraretinal fluid. SRF - subretinal fluid. EZ - ellipsoid zone. ERM - epiretinal membrane. ORA - outer retinal atrophy. ORT - outer retinal tubulation. SRHM - subretinal hyper-reflective material         Color Fundus Photography Optos - OU - Both Eyes       Right Eye Progression has been stable. Disc findings include neovascularization. Macula : hemorrhage, flat. Vessels : Neovascularization, tortuous vessels, attenuated (Early fibrotic NVE superonasal to disc). Periphery : hemorrhage.   Left Eye Progression has been stable. Disc findings include neovascularization (Obscured by neovascular plaque). Macula : detached (Obscured by fibrosis and VH). Vessels : Neovascularization. Periphery : hemorrhage.   Notes **Images stored on drive**  Impression: OD: subhyaloid hemes along IT arcades and nasal to disc; +NVE/NVD; scattered IRH periphery OS:  Central fibrotic NV overlying disc; diffuse VH and fibrosis obscuring retina; +TRD        Panretinal Photocoagulation - OD - Right Eye       LASER PROCEDURE NOTE  Diagnosis:   Proliferative Diabetic Retinopathy, RIGHT EYE  Procedure:  Pan-retinal photocoagulation using slit lamp laser, RIGHT EYE  Anesthesia:  Topical  Surgeon: Barbara Caffey, MD, PhD   Informed consent obtained, operative eye marked, and time out performed prior to initiation of laser.   Lumenis [id number] slit lamp laser Pattern: 3x3 square Power: 260 mW Duration: 30 msec  Spot size: 200 microns  # spots: [phone] spots  Complications: None.  Notes: vitreous heme obscuring view and preventing laser up take inferiorly  RTC: 3 wks  Patient tolerated the procedure well and received written and verbal post-procedure care information/education.                  ASSESSMENT/PLAN:    ICD-10-CM   1. 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 Vitreous hemorrhage of left eye (HCC) H43.12 Color Fundus Photography Optos - OU - Both Eyes  4. Retinal edema H35.81 OCT, Retina - OU - Both Eyes  5. Essential hypertension I10   6. Hypertensive retinopathy of both eyes H35.033   7. Nuclear sclerosis of both eyes H25.13     1-4. 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 (OS > OD)  - s/p PRP OS (11.26.19)  - lost to f/u following 11.26.19 appt due to complicated pregnancy, premature birth of baby w/ extended NICU stay and congenital heart defect, and post-partum health issues  - 5-6 mo history of vision loss OS since December 2019  - on today's exam:   OD - persistent +NVD/NVE and subhyaloid heme about the disc and along inf temp arcades   OS - florid, plaque-like NVD with traction and diffuse old VH  - OCT with stable resolution of IRF OD; OS with very limited view but central fibrotic plaque  - BCVA OD 20/25; OS CF at face, down from 20/80  - discussed findings, severity of disease, importance of adherence to f/u, and guarded prognosis  - discussed likely need for PPV w/ membrane peel +/- silicon oil OS -- pt would like to schedule surgery for July 2020 -- will plan for July 2 -- will obtain consent etc. In 3 wks  - recommend  PRP OD today, 06.01.2020   - RBA of procedure discussed, questions answered  - informed consent obtained and signed  - see procedure note  - f/u 3 weeks for DFE/OCT/possible injection and surgical planning   5,6. Hypertensive retinopathy OU  - there was concern for pregnancy related hypertension (I.e. Pre-eclampsia, Eclampsia, HELLP, etc) at last visit  - also at last visit, had bilateral CRVO w/ CME (OD > OS) s/p PRP OS  - discussed importance of tight BP control  - monitor  7. Nuclear sclerosis OU - The symptoms of  cataract, surgical options, and treatments and risks were discussed with patient. - discussed diagnosis and progression - not yet visually significant - monitor for now    Ophthalmic Meds Ordered this visit:  Meds ordered this encounter  Medications  . prednisoLONE acetate (PRED FORTE) 1 % ophthalmic suspension    Sig: Place 1 drop into the right eye 4 (four) times daily for 7 days.    Dispense:  10 mL    Refill:  0      Return in about 3 weeks (around 07/10/2018) for Dilated Exam, OCT.  There are no Patient Instructions on file for this visit.   Explained the diagnoses, plan, and follow up with the patient and they expressed understanding.  Patient expressed understanding of the importance of proper follow up care.  This document serves as a record of services personally performed by Gardiner Sleeper, MD, PhD. It was created on their behalf by Ernest Mallick, OA, an ophthalmic assistant. The creation of this record is the provider's dictation and/or activities during the visit.    Electronically signed by: Ernest Mallick, OA  06.01.2020 4:27 PM    Gardiner Sleeper, M.D., Ph.D. Diseases & Surgery of the Retina and Vitreous [facility]  I have reviewed the above documentation  for accuracy and completeness, and I agree with the above. Gardiner Sleeper, M.D., Ph.D. 06/19/18 4:27 PM    Abbreviations: M myopia (nearsighted); A astigmatism; H hyperopia (farsighted); P presbyopia; Mrx spectacle prescription;  CTL contact lenses; OD right eye; OS left eye; OU both eyes  XT exotropia; ET esotropia; PEK punctate epithelial keratitis; PEE punctate epithelial erosions; DES dry eye syndrome; MGD meibomian gland dysfunction; ATs artificial tears; PFAT's preservative free artificial tears; [place] nuclear sclerotic cataract; PSC posterior subcapsular cataract; ERM epi-retinal membrane; PVD posterior vitreous detachment; RD retinal detachment; DM diabetes mellitus; DR diabetic  retinopathy; NPDR non-proliferative diabetic retinopathy; PDR proliferative diabetic retinopathy; CSME clinically significant macular edema; DME diabetic macular edema; dbh dot blot hemorrhages; CWS cotton wool spot; POAG primary open angle glaucoma; C/D cup-to-disc ratio; HVF humphrey visual field; GVF goldmann visual field; OCT optical coherence tomography; IOP intraocular pressure; BRVO Branch retinal vein occlusion; CRVO central retinal vein occlusion; CRAO central retinal artery occlusion; BRAO branch retinal artery occlusion; RT retinal tear; SB scleral buckle; PPV pars plana vitrectomy; VH Vitreous hemorrhage; PRP panretinal laser photocoagulation; IVK intravitreal kenalog; VMT vitreomacular traction; MH Macular hole;  NVD neovascularization of the disc; NVE neovascularization elsewhere; AREDS age related eye disease study; ARMD age related macular degeneration; POAG primary open angle glaucoma; EBMD epithelial/anterior basement membrane dystrophy; ACIOL anterior chamber intraocular lens; IOL intraocular lens; PCIOL posterior chamber intraocular lens; Phaco/IOL phacoemulsification with intraocular lens placement; [place] photorefractive keratectomy; LASIK laser assisted in situ keratomileusis; HTN hypertension; DM diabetes mellitus; COPD chronic obstructive pulmonary disease

## 2018-06-23 ENCOUNTER — Telehealth (INDEPENDENT_AMBULATORY_CARE_PROVIDER_SITE_OTHER): Payer: Self-pay

## 2018-06-24 ENCOUNTER — Other Ambulatory Visit: Payer: Self-pay | Admitting: Endocrinology

## 2018-07-10 NOTE — Progress Notes (Addendum)
[facility] Note  07/11/2018     CHIEF COMPLAINT Patient presents for Retina Follow Up   HISTORY OF PRESENT ILLNESS: Barbara Donovan is a 28 y.o. female who presents to the clinic today for:   HPI    Retina Follow Up    Patient presents with  Diabetic Retinopathy.  In both eyes.  This started 3 weeks ago.  Severity is moderate.  I, the attending physician,  performed the HPI with the patient and updated documentation appropriately.          Comments    Patient here for 3 weeks retina follow up for PDR OU ( OS>OD) Pre - op appt. Patient states vision about the same. No eye pain.       Last edited by Bernarda Caffey, MD on 07/11/2018  1:11 PM. (History)      Referring physician: No referring provider defined for this encounter.  HISTORICAL INFORMATION:   Selected notes from the MEDICAL RECORD NUMBER Referred by Dr. [name] for concern of bilateral CRVO LEE-  Ocular Hx-  PMH-     CURRENT MEDICATIONS: No current outpatient medications on file. (Ophthalmic Drugs)   No current facility-administered medications for this visit.  (Ophthalmic Drugs)   Current Outpatient Medications (Other)  Medication Sig  . amLODipine (NORVASC) 5 MG tablet Take 1 tablet (5 mg total) by mouth daily. For Blood Pressure (Patient not taking: Reported on 07/10/2018)  . carvedilol (COREG) 6.25 MG tablet Take 1 tablet (6.25 mg total) by mouth 2 (two) times daily.  . ferrous sulfate 325 (65 FE) MG tablet Take 1 tablet (325 mg total) by mouth 2 (two) times daily with a meal. (Patient not taking: Reported on 07/10/2018)  . insulin aspart (NOVOLOG) 100 UNIT/ML injection For use in pump, total of 60 units per day. (Patient taking differently: Inject 60 Units into the skin daily. )  . insulin glargine (LANTUS) 100 unit/mL SOPN Inject 30 Units into the skin at bedtime.  . Insulin Pen Needle (PEN NEEDLES) 32G X 5 MM MISC 1 Device by Does not apply route 4 (four) times daily.  (Patient not taking: Reported on 07/10/2018)  . ONETOUCH VERIO test strip USED TO CHECK BLOOD SUGARS FIVE TIMES DAILY. (Patient not taking: Reported on 07/10/2018)  . spironolactone (ALDACTONE) 25 MG tablet Take 1 tablet (25 mg total) by mouth daily. (Patient not taking: Reported on 07/10/2018)   No current facility-administered medications for this visit.  (Other)      REVIEW OF SYSTEMS: ROS    Positive for: Endocrine, Cardiovascular, Eyes   Negative for: Constitutional, Gastrointestinal, Neurological, Skin, Genitourinary, Musculoskeletal, HENT, Respiratory, Psychiatric, Allergic/Imm, Heme/Lymph   Last edited by Theodore Demark on 07/11/2018  1:09 PM. (History)       ALLERGIES No Known Allergies  PAST MEDICAL HISTORY Past Medical History:  Diagnosis Date  . Asthma 10/30/2013  . Chronic hypertension during pregnancy, antepartum 08/19/2017  . Dehydration 01/28/2018  . Depression during pregnancy, antepartum 07/07/2017   6/20: Short trial of zoloft previously, reports didn't help much but also didn't give it a chance Discussed r/b/a SSRIs in pregnancy, agrees to try Zoloft again, rx sent No SI/HI/red flags  . Diabetes ([place])    TYPE I  . Diabetic retinopathy ([place]) 06/09/2017   07/2017 with bilateral severe diabetic non-proliferative retinopathy with macular edema.  . Hypokalemia 01/22/2018  . Hypomagnesemia 01/28/2018  . Intractable nausea and vomiting 01/22/2018  . Intrauterine growth restriction (IUGR) affecting care  of mother 12/22/2017  . Morbid obesity ([place])   . Nephropathy, diabetic ([place]) 12/29/2017  . 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 Severe hyperemesis gravidarum 10/30/2017  . Type I diabetes mellitus ([place]) 07/07/2017   Current Diabetic Medications:  Insulin  [x]  Aspirin 81 mg daily after 12 weeks (? A2/B GDM)  Required Referrals for A1GDM or A2GDM: [x]  Diabetes Education and Testing Supplies [x]  Nutrition Cousult  For A2/B GDM or  higher classes of DM [x]  Diabetes Education and Testing Supplies [x]  Nutrition Counsult [x]  Fetal ECHO after 20 weeks  [x]  Eye exam for retina evaluation - severe retinopathy 7/19  Base  . Ventricular septal defect (VSD) of fetus in singleton pregnancy, antepartum 09/30/2017   May go to newborn nursery per Dr. Lenard Simmer Echo prior to discharge   Past Surgical History:  Procedure Laterality Date  . CESAREAN SECTION N/A 12/26/2017   Procedure: CESAREAN SECTION;  Surgeon: Osborne Oman, MD;  Location: [place];  Service: Obstetrics;  Laterality: N/A;  . NO PAST SURGERIES    . WISDOM TOOTH EXTRACTION      FAMILY HISTORY Family History  Problem Relation Age of Onset  . Diabetes Mother   . Aneurysm Mother 80  . Diabetes Father   . Cataracts Father   . Healthy Sister   . Healthy Brother   . Healthy Daughter   . Amblyopia Neg Hx   . Blindness Neg Hx   . Glaucoma Neg Hx   . Macular degeneration Neg Hx   . Retinal detachment Neg Hx   . Strabismus Neg Hx   . Retinitis pigmentosa Neg Hx     SOCIAL HISTORY Social History   Tobacco Use  . Smoking status: Never Smoker  . Smokeless tobacco: Never Used  Substance Use Topics  . Alcohol use: Not Currently    Comment: SOCIALLY  . Drug use: Never         OPHTHALMIC EXAM:  Base Eye Exam    Visual Acuity (Snellen - Linear)      Right Left   Dist cc 20/40 +2 20/800   Dist ph cc 20/30 -1 NI   Correction: Glasses       Tonometry (Tonopen, 1:05 PM)      Right Left   Pressure 18 16       Pupils      Dark Light Shape React APD   Right 4 3 Round Slow None   Left 5 4.5 Round Sluggish +1       Visual Fields (Counting fingers)      Left Right     Full   Restrictions Partial outer superior temporal, inferior temporal, superior nasal, inferior nasal deficiencies        Extraocular Movement      Right Left    Full Full       Neuro/Psych    Oriented x3: Yes   Mood/Affect: Normal       Dilation    Both eyes: 1.0%  Mydriacyl, 2.5% Phenylephrine @ 1:05 PM        Slit Lamp and Fundus Exam    Slit Lamp Exam      Right Left   Lids/Lashes Normal Normal   Conjunctiva/Sclera White and quiet mild Melanosis   Cornea 1+ Punctate epithelial erosions Clear   Anterior Chamber Deep and quiet Deep and quiet   Iris Round and dilated, No NVI Round and moderately dilated to 95mmsaz,  No NVI   Lens trace Nuclear sclerosis and cortical changes Trace Nuclear sclerosis and cortical changes, mild PSC   Vitreous trace pigment in anterior vitreous, mild residual VH settled inferiorly +pigment in anterior vitreous       Fundus Exam      Right Left   Disc Pink and Sharp; +fibrosis and NVD - regressing Massive fibrosis and NVD obscuring disc   C/D Ratio 0.5    Macula Flat, Blunted foveal reflex, subhyaloid heme along IT arcades No view; tractional fibrosis   Vessels Vascular attenuation, Tortuous, +NVE superior to disc and all arcades Hazy view; +NVD/NVE   Periphery Attached, scattered DBH, nasal subhyaloid heme just off disc, good 360 PRP Hazy view; obscured by VH and vitreous condensations and fibrosis        Refraction    Wearing Rx      Sphere Cylinder Axis   Right -1.75 +0.25 012   Left -1.75 +0.50 002          IMAGING AND PROCEDURES  Imaging and Procedures for @TODAY @  OCT, Retina - OU - Both Eyes       Right Eye Quality was good. Central Foveal Thickness: 224. Progression has been stable. Findings include no SRF, intraretinal hyper-reflective material, no IRF, normal foveal contour, vitreous traction, macular pucker, epiretinal membrane, preretinal fibrosis (Interval improvement in vitreous opacities and sub hyaloid heme).   Left Eye Quality was poor. Progression has improved. Findings include abnormal foveal contour, preretinal fibrosis, epiretinal membrane, vitreous traction (Hazy view, macula attached with pre-retinal fibrosis along arcades and overlying disc).   Notes *Images captured and stored  on drive  Diagnosis / Impression:  PDR OU OD: mild preretinal fibrosis/ERM/pucker but Interval improvement in vitreous opacities and subhyaloid heme OS: Hazy view, macula attached with pre-retinal fibrosis along arcades and overlying disc  Clinical management:  See below  Abbreviations: NFP - Normal foveal profile. CME - cystoid macular edema. PED - pigment epithelial detachment. IRF - intraretinal fluid. SRF - subretinal fluid. EZ - ellipsoid zone. ERM - epiretinal membrane. ORA - outer retinal atrophy. ORT - outer retinal tubulation. SRHM - subretinal hyper-reflective material         Intravitreal Injection, Pharmacologic Agent - OS - Left Eye       Time Out 07/11/2018. 3:02 PM. Confirmed correct patient, procedure, site, and patient consented.   Anesthesia Topical anesthesia was used. Anesthetic medications included Lidocaine 2%, Proparacaine 0.5%.   Procedure Preparation included 5% betadine to ocular surface, eyelid speculum. A supplied needle was used.   Injection:  1.25 mg Bevacizumab (AVASTIN) SOLN   NDC: 83419-622-29, Lot: [id number] , Expiration date: 08/30/2018   Route: Intravitreal, Site: Left Eye, Waste: 0 mL  Post-op Post injection exam found visual acuity of at least counting fingers. The patient tolerated the procedure well. There were no complications. The patient received written and verbal post procedure care education.                 ASSESSMENT/PLAN:    ICD-10-CM   1. 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 Traction detachment of left retina  H33.42   3. Vitreous hemorrhage of left eye (HCC)  H43.12   4. Retinal edema  H35.81 OCT, Retina - OU - Both Eyes  5. Essential hypertension  I10   6. Hypertensive  retinopathy of both eyes  H35.033   7. Nuclear sclerosis of both eyes  H25.13   8. Central retinal vein occlusion with macular edema of both eyes  H34.8130     1-4. 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 (OS > OD)  - s/p PRP OS (11.26.19)  - s/p PRP OD (06.01.20)  - lost to f/u following 11.26.19 appt due to complicated pregnancy, premature birth of baby w/ extended NICU stay and congenital heart defect, and post-partum health issues  - history of vision loss OS since December 2019  - on today's exam:   OD - persistent +NVD/NVE; subhyaloid heme about the disc and along inf temp arcades improving; good early PRP laser changes in place   OS - florid, plaque-like NVD with traction and diffuse old VH; slightly improved view today; VA 20/800  - OCT with interval improvement in vitreous opacities and subhayloid heme OD; OS macula attached with pre-retinal fibrosis along arcades and overlying disc  - BCVA OD 20/30; OS improved to 20/800 from CF at face  - discussed findings, severity of disease, importance of adherence to f/u, and guarded prognosis  - recommend 25g PPV w/ membrane peel and TRD repair OS under general anesthesia  - recommend pre-op IVA OS #1 today, 06.23.2020  - pt elects to proceed with both  - RBA of procedure discussed, questions answered  - informed consent obtained and signed  - see procedure note  - surgery scheduled for July 20, 2018 at 11:30AM, [facility] OR 8   5,6. Hypertensive retinopathy OU  - there was concern for pregnancy related hypertension (I.e. Pre-eclampsia, Eclampsia, HELLP, etc) during pregnancy  - also during pregnancy (7.17.2019), had bilateral CRVO w/ CME (OD > OS) s/p PRP OS  - discussed importance of tight BP control  - monitor  7. Nuclear sclerosis OU - The symptoms of cataract, surgical options, and treatments and risks were discussed with patient. - discussed diagnosis and progression - not yet visually significant - monitor for  now    Ophthalmic Meds Ordered this visit:  Meds ordered this encounter  Medications  . Bevacizumab (AVASTIN) SOLN 1.25 mg      Return in about 10 days (around 07/21/2018) for POV.  There are no Patient Instructions on file for this visit.   Explained the diagnoses, plan, and follow up with the patient and they expressed understanding.  Patient expressed understanding of the importance of proper follow up care.  This document serves as a record of services personally performed by Gardiner Sleeper, MD, PhD. It was created on their behalf by Ernest Mallick, OA, an ophthalmic assistant. The creation of this record is the provider's dictation and/or activities during the visit.    Electronically signed by: Ernest Mallick, OA  06.22.2020 8:29 AM    Gardiner Sleeper, M.D., Ph.D. Diseases & Surgery of the Retina and Vitreous [facility]  I have reviewed the above documentation for accuracy and completeness, and I agree with the above. Gardiner Sleeper, M.D., Ph.D. 07/12/18 8:29 AM    Abbreviations: M myopia (nearsighted); A astigmatism; H hyperopia (farsighted); P presbyopia; Mrx spectacle prescription;  CTL contact lenses; OD right eye; OS left eye; OU both eyes  XT exotropia; ET esotropia; PEK punctate epithelial keratitis; PEE punctate epithelial erosions; DES dry eye syndrome; MGD meibomian gland dysfunction; ATs artificial tears; PFAT's preservative free artificial tears; [place] nuclear sclerotic cataract; [place]  posterior subcapsular cataract; ERM epi-retinal membrane; PVD posterior vitreous detachment; RD retinal detachment; DM diabetes mellitus; DR diabetic retinopathy; NPDR non-proliferative diabetic retinopathy; PDR proliferative diabetic retinopathy; CSME clinically significant macular edema; DME diabetic macular edema; dbh dot blot hemorrhages; CWS cotton wool spot; POAG primary open angle glaucoma; C/D cup-to-disc ratio; HVF humphrey visual field; GVF goldmann visual  field; OCT optical coherence tomography; IOP intraocular pressure; BRVO Branch retinal vein occlusion; CRVO central retinal vein occlusion; CRAO central retinal artery occlusion; BRAO branch retinal artery occlusion; RT retinal tear; SB scleral buckle; PPV pars plana vitrectomy; VH Vitreous hemorrhage; PRP panretinal laser photocoagulation; IVK intravitreal kenalog; VMT vitreomacular traction; MH Macular hole;  NVD neovascularization of the disc; NVE neovascularization elsewhere; AREDS age related eye disease study; ARMD age related macular degeneration; POAG primary open angle glaucoma; EBMD epithelial/anterior basement membrane dystrophy; ACIOL anterior chamber intraocular lens; IOL intraocular lens; PCIOL posterior chamber intraocular lens; Phaco/IOL phacoemulsification with intraocular lens placement; [place] photorefractive keratectomy; LASIK laser assisted in situ keratomileusis; HTN hypertension; DM diabetes mellitus; COPD chronic obstructive pulmonary disease

## 2018-07-11 ENCOUNTER — Encounter (INDEPENDENT_AMBULATORY_CARE_PROVIDER_SITE_OTHER): Payer: Self-pay | Admitting: Ophthalmology

## 2018-07-11 ENCOUNTER — Ambulatory Visit (INDEPENDENT_AMBULATORY_CARE_PROVIDER_SITE_OTHER): Payer: BC Managed Care – PPO | Admitting: Ophthalmology

## 2018-07-11 ENCOUNTER — Other Ambulatory Visit: Payer: Self-pay

## 2018-07-11 DIAGNOSIS — H2513 Age-related nuclear cataract, bilateral: Secondary | ICD-10-CM

## 2018-07-11 DIAGNOSIS — H3342 Traction detachment of retina, left eye: Secondary | ICD-10-CM | POA: Diagnosis not present

## 2018-07-11 DIAGNOSIS — H35033 Hypertensive retinopathy, bilateral: Secondary | ICD-10-CM

## 2018-07-11 DIAGNOSIS — H3581 Retinal edema: Secondary | ICD-10-CM

## 2018-07-11 DIAGNOSIS — E103513 Type 1 diabetes mellitus with proliferative diabetic retinopathy with macular edema, bilateral: Secondary | ICD-10-CM

## 2018-07-11 DIAGNOSIS — H4312 Vitreous hemorrhage, left eye: Secondary | ICD-10-CM

## 2018-07-11 DIAGNOSIS — I10 Essential (primary) hypertension: Secondary | ICD-10-CM

## 2018-07-11 DIAGNOSIS — H34813 Central retinal vein occlusion, bilateral, with macular edema: Secondary | ICD-10-CM

## 2018-07-11 MED ORDER — BEVACIZUMAB CHEMO INJECTION 1.25MG/0.05ML SYRINGE FOR KALEIDOSCOPE
1.2500 mg | INTRAVITREAL | Status: AC | PRN
  Administered 2018-07-11: 1.25 mg via INTRAVITREAL

## 2018-07-14 NOTE — Progress Notes (Signed)
[id number] [address], [place] [address] Phone: [phone] Fax: [phone]      Your procedure is scheduled on Thursday, 7/2.  Report to [facility] Main Entrance "A" at 9:30 A.M., and check in at the Admitting office.  Call this number if you have problems the morning of surgery:  [phone]  Call [phone] if you have any questions prior to your surgery date Monday-Friday 8am-4pm    Remember:  Do not eat or drink after midnight Wednesday.   Take these medicines the morning of surgery with A SIP OF WATER:  carvedilol (COREG)   . THE NIGHT BEFORE SURGERY, take __24___ units of _Lantus_insulin.      . THE MORNING OF SURGERY, take ___0____ units of _____Novolog_insulin.  . If your CBG is greater than 220 mg/dL, you may take  of your sliding scale (correction) dose of insulin.   . Make sure that the doctor who takes care of your diabetes knows about your planned surgery including the date and location.  How do I manage my blood sugar before surgery? . Check your blood sugar at least 4 times a day, starting 2 days before surgery, to make sure that the level is not too high or low. o Check your blood sugar the morning of your surgery when you wake up and every 2 hours until you get to the Short Stay unit. . If your blood sugar is less than 70 mg/dL, you will need to treat for low blood sugar: o Do not take insulin. o Treat a low blood sugar (less than 70 mg/dL) with  cup of clear juice (cranberry or apple), 4 glucose tablets, OR glucose gel. o Recheck blood sugar in 15 minutes after treatment (to make sure it is greater than 70 mg/dL). If your blood sugar is not greater than 70 mg/dL on recheck, call [phone] for further instructions. . Report your blood sugar to the short stay nurse when you get to Short Stay.  7 days prior to surgery STOP taking any Aspirin (unless otherwise instructed by your surgeon),  Aleve, Naproxen, Ibuprofen, Motrin, Advil, Goody's, BC's, all herbal medications, fish oil, and all vitamins.    The Morning of Surgery  Do not wear jewelry, make-up or nail polish.  Do not wear lotions, powders, or perfumes, or deodorant  Do not shave 48 hours prior to surgery. .  Do not bring valuables to the hospital.  [facility] is not responsible for any belongings or valuables.  If you are a smoker, DO NOT Smoke 24 hours prior to surgery  IF you wear a CPAP at night please bring your mask, tubing, and machine the morning of surgery   Remember that you must have someone to transport you home after your surgery, and remain with you for 24 hours if you are discharged the same day.  Patients discharged the day of surgery will not be allowed to drive home.  Contacts, glasses, hearing aids, dentures or bridgework may not be worn into surgery.   For patients admitted to the hospital, discharge time will be determined by your treatment team.  Special instructions:   [place]- Preparing For Surgery  Before surgery, you can play an important role. Because skin is not sterile, your skin needs to be as free of germs as possible. You can reduce the number of germs on your skin by washing with CHG (chlorahexidine gluconate) Soap before surgery.  CHG is an antiseptic  cleaner which kills germs and bonds with the skin to continue killing germs even after washing.    Oral Hygiene is also important to reduce your risk of infection.  Remember - BRUSH YOUR TEETH THE MORNING OF SURGERY WITH YOUR REGULAR TOOTHPASTE  Please do not use if you have an allergy to CHG or antibacterial soaps. If your skin becomes reddened/irritated stop using the CHG.  Do not shave (including legs and underarms) for at least 48 hours prior to first CHG shower. It is OK to shave your face.  Please follow these instructions carefully.   1. Shower the [employer] BEFORE SURGERY (Wed) and the MORNING OF SURGERY (Thurs) with CHG  Soap.   2. If you chose to wash your hair, wash your hair first as usual with your normal shampoo.  3. After you shampoo, rinse your hair and body thoroughly to remove the shampoo.  4. Use CHG as you would any other liquid soap. You can apply CHG directly to the skin and wash gently with a scrungie or a clean washcloth.   5. Apply the CHG Soap to your body ONLY FROM THE NECK DOWN.  Do not use on open wounds or open sores. Avoid contact with your eyes, ears, mouth and genitals (private parts). Wash Face and genitals (private parts)  with your normal soap.   6. Wash thoroughly, paying special attention to the area where your surgery will be performed.  7. Thoroughly rinse your body with warm water from the neck down.  8. DO NOT shower/wash with your normal soap after using and rinsing off the CHG Soap.  9. Pat yourself dry with a CLEAN TOWEL.  10. Wear CLEAN PAJAMAS to bed the night before surgery, wear comfortable clothes the morning of surgery  11. Place CLEAN SHEETS on your bed the night of your first shower and DO NOT SLEEP WITH PETS.    Day of Surgery:  Do not apply any deodorants/lotions.  Please wear clean clothes to the hospital/surgery center.   Remember to brush your teeth WITH YOUR REGULAR TOOTHPASTE.   Please read over the following fact sheets that you were given.

## 2018-07-15 NOTE — H&P (Signed)
Barbara Donovan is an 28 y.o. female.    Chief Complaint: Proliferative diabetic retinopathy OU with vitreous hemorrhage and tractional fibrosis OS  HPI: 28 yo F with complex past medical history that includes DM1, and severe HTN during pregnancy, who developed progressive vision loss OS. 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 After a thorough discussion for the risks, benefits and alternatives to surgery, the pt has elected to undergo surgery to clear the vitreous hemorrhage and to remove the tractional fibrosis from the  underlying retina, OS, under general anesthesia.  Past Medical History:  Diagnosis Date  . Asthma 10/30/2013  . Chronic hypertension during pregnancy, antepartum 08/19/2017  . Dehydration 01/28/2018  . Depression during pregnancy, antepartum 07/07/2017   6/20: Short trial of zoloft previously, reports didn't help much but also didn't give it a chance Discussed r/b/a SSRIs in pregnancy, agrees to try Zoloft again, rx sent No SI/HI/red flags  . Diabetes ([place])    TYPE I  . Diabetic retinopathy ([place]) 06/09/2017   07/2017 with bilateral severe diabetic non-proliferative retinopathy with macular edema.  . Hypokalemia 01/22/2018  . Hypomagnesemia 01/28/2018  . Intractable nausea and vomiting 01/22/2018  . Intrauterine growth restriction (IUGR) affecting care of mother 12/22/2017  . Morbid obesity ([place])   . Nephropathy, diabetic ([place]) 12/29/2017  . 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 Severe hyperemesis gravidarum 10/30/2017  . Type I diabetes mellitus ([place]) 07/07/2017   Current Diabetic Medications:  Insulin  [x]  Aspirin 81 mg daily after 12 weeks (? A2/B GDM)  Required Referrals for A1GDM or A2GDM: [x]  Diabetes Education and Testing Supplies [x]  Nutrition Cousult  For A2/B GDM or higher classes of DM [x]  Diabetes Education and Testing Supplies [x]  Nutrition Counsult [x]  Fetal ECHO  after 20 weeks  [x]  Eye exam for retina evaluation - severe retinopathy 7/19  Base  . Ventricular septal defect (VSD) of fetus in singleton pregnancy, antepartum 09/30/2017   May go to newborn nursery per Dr. Lenard Simmer Echo prior to discharge    Past Surgical History:  Procedure Laterality Date  . CESAREAN SECTION N/A 12/26/2017   Procedure: CESAREAN SECTION;  Surgeon: Osborne Oman, MD;  Location: [place];  Service: Obstetrics;  Laterality: N/A;  . NO PAST SURGERIES    . WISDOM TOOTH EXTRACTION      Family History  Problem Relation Age of Onset  . Diabetes Mother   . Aneurysm Mother 50  . Diabetes Father   . Cataracts Father   . Healthy Sister   . Healthy Brother   . Healthy Daughter   . Amblyopia Neg Hx   . Blindness Neg Hx   . Glaucoma Neg Hx   . Macular degeneration Neg Hx   . Retinal detachment Neg Hx   . Strabismus Neg Hx   . Retinitis pigmentosa Neg Hx    Social History:  reports that she has never smoked. She has never used smokeless tobacco. She reports previous alcohol use. She reports that she does not use drugs.  Allergies: No Known Allergies  No medications prior to admission.    Review of systems otherwise negative  unknown if currently breastfeeding.  Physical exam: Mental status: oriented x3. Eyes: See eye exam associated with this date of surgery Ears, Nose, Throat: within normal limits Neck: Within Normal limits General: within normal limits Chest: Within normal limits Breast: deferred Heart: Within normal  limits Abdomen: Within normal limits GU: deferred Extremities: within normal limits Skin: within normal limits  Assessment/Plan 1. Proliferative diabetic retinopathy OU with vitreous hemorrhage and preretinal fibrosis OS  Plan: To [facility] for exam under anesthesia w/ possible laser and intravitreal Avastin OD + 25g PPV w/ membrane peel and endolaser and possible air vs gas vs silicon oil OS under general anesthesia - case  scheduled for 7.2.2020 -- [facility] OR 08  Gardiner Sleeper, M.D., Ph.D. Vitreoretinal Surgeon [facility]

## 2018-07-17 ENCOUNTER — Encounter (HOSPITAL_COMMUNITY): Payer: Self-pay

## 2018-07-17 ENCOUNTER — Other Ambulatory Visit: Payer: Self-pay

## 2018-07-17 ENCOUNTER — Other Ambulatory Visit (HOSPITAL_COMMUNITY)
Admission: RE | Admit: 2018-07-17 | Discharge: 2018-07-17 | Disposition: A | Discharge status: Home / Self Care | Payer: BC Managed Care – PPO | Source: Ambulatory Visit | Attending: Ophthalmology | Admitting: Ophthalmology

## 2018-07-17 ENCOUNTER — Encounter (HOSPITAL_COMMUNITY)
Admission: RE | Admit: 2018-07-17 | Discharge: 2018-07-17 | Disposition: A | Discharge status: Home / Self Care | Payer: BC Managed Care – PPO | Source: Ambulatory Visit | Attending: Ophthalmology | Admitting: Ophthalmology

## 2018-07-17 DIAGNOSIS — Z01812 Encounter for preprocedural laboratory examination: Secondary | ICD-10-CM | POA: Insufficient documentation

## 2018-07-17 DIAGNOSIS — Z1159 Encounter for screening for other viral diseases: Secondary | ICD-10-CM | POA: Insufficient documentation

## 2018-07-17 HISTORY — DX: Essential (primary) hypertension: I10

## 2018-07-17 LAB — SARS CORONAVIRUS 2 (TAT 6-24 HRS): SARS Coronavirus 2: NEGATIVE

## 2018-07-17 LAB — CBC
HCT: 36.7 % (ref 36.0–46.0)
Hemoglobin: 11.9 g/dL — ABNORMAL LOW (ref 12.0–15.0)
MCH: 27.2 pg (ref 26.0–34.0)
MCHC: 32.4 g/dL (ref 30.0–36.0)
MCV: 83.8 fL (ref 80.0–100.0)
Platelets: 434 10*3/uL — ABNORMAL HIGH (ref 150–400)
RBC: 4.38 MIL/uL (ref 3.87–5.11)
RDW: 12.4 % (ref 11.5–15.5)
WBC: 6 10*3/uL (ref 4.0–10.5)
nRBC: 0 % (ref 0.0–0.2)

## 2018-07-17 LAB — BASIC METABOLIC PANEL
Anion gap: 8 (ref 5–15)
BUN: 23 mg/dL — ABNORMAL HIGH (ref 6–20)
CO2: 25 mmol/L (ref 22–32)
Calcium: 8.9 mg/dL (ref 8.9–10.3)
Chloride: 101 mmol/L (ref 98–111)
Creatinine, Ser: 0.98 mg/dL (ref 0.44–1.00)
GFR calc Af Amer: >60 mL/min (ref >60)
GFR calc non Af Amer: >60 mL/min (ref >60)
Glucose, Bld: 363 mg/dL — ABNORMAL HIGH (ref 70–99)
Potassium: 4.5 mmol/L (ref 3.5–5.1)
Sodium: 134 mmol/L — ABNORMAL LOW (ref 135–145)

## 2018-07-17 LAB — GLUCOSE, CAPILLARY: Glucose-Capillary: 378 mg/dL — ABNORMAL HIGH (ref 70–99)

## 2018-07-17 NOTE — Progress Notes (Addendum)
PCP - Denies Cardiologist - Dr. Fransico Him Endocrinologist: Dr. Loanne Drilling  Chest x-ray - 01/22/18 EKG - 03/28/18 Stress Test - denies ECHO - denies Cardiac Cath - 01/02/18  Sleep Study - denies CPAP - denies  Fasting Blood Sugar - 130's Checks Blood Sugar approximately 4  times a day A1C 05/24/18-12.0 CBG at PAT 378; pt has been off insulin pump for about 2 weeks d/t recent move. Pt has been supplementing with Novolog pen. Pt gave herself 8 units at PAT appointment.  Diabetic coordinator paged, awaiting response. Instructed pt to reduce basal rate by 20% at midnight the night before surgery.  Ebony Hail, [place] made aware.   Blood Thinner Instructions:N/A Aspirin Instructions:N/A  Anesthesia review: Yes, A1C 12.0  Patient denies shortness of breath, fever, cough and chest pain at PAT appointment   Patient verbalized understanding of instructions that were given to them at the PAT appointment. Patient was also instructed that they will need to review over the PAT instructions again at home before surgery.  Pt to have Covid screening today after PAT appointment. Aware of quarantine rules and verbalizes agreement.    Coronavirus Screening  Have you experienced the following symptoms:  Cough yes/no: No Fever (>100.43F)  yes/no: No Runny nose yes/no: No Sore throat yes/no: No Difficulty breathing/shortness of breath  yes/no: No  Have you or a family member traveled in the last 14 days and where? yes/no: No   If the patient indicates "YES" to the above questions, their PAT will be rescheduled to limit the exposure to others and, the surgeon will be notified. THE PATIENT WILL NEED TO BE ASYMPTOMATIC FOR 14 DAYS.   If the patient is not experiencing any of these symptoms, the PAT nurse will instruct them to NOT bring anyone with them to their appointment since they may have these symptoms or traveled as well.   Please remind your patients and families that hospital visitation restrictions  are in effect and the importance of the restrictions.

## 2018-07-17 NOTE — Progress Notes (Signed)
[id number] [address], [place] [address] Phone: [phone] Fax: [phone]      Your procedure is scheduled on Thursday, 7/2.  Report to [facility] Main Entrance "A" at 9:30 A.M., and check in at the Admitting office.  Call this number if you have problems the morning of surgery:  [phone]  Call [phone] if you have any questions prior to your surgery date Monday-Friday 8am-4pm    Remember:  Do not eat or drink after midnight Wednesday.   Take these medicines the morning of surgery with A SIP OF WATER:  carvedilol (COREG)   . THE NIGHT BEFORE SURGERY, take __24___ units of _Lantus_insulin.  At [place] the night before surgery, reduce your basal rate on your insulin pump by 20%.  . Make sure that the doctor who takes care of your diabetes knows about your planned surgery including the date and location.  How do I manage my blood sugar before surgery? . Check your blood sugar at least 4 times a day, starting 2 days before surgery, to make sure that the level is not too high or low. o Check your blood sugar the morning of your surgery when you wake up and every 2 hours until you get to the Short Stay unit. . If your blood sugar is less than 70 mg/dL, you will need to treat for low blood sugar: o Do not take insulin. o Treat a low blood sugar (less than 70 mg/dL) with  cup of clear juice (cranberry or apple), 4 glucose tablets, OR glucose gel. o Recheck blood sugar in 15 minutes after treatment (to make sure it is greater than 70 mg/dL). If your blood sugar is not greater than 70 mg/dL on recheck, call [phone] for further instructions. . Report your blood sugar to the short stay nurse when you get to Short Stay.  As of today, STOP taking any Aspirin (unless otherwise instructed by your surgeon), Aleve, Naproxen, Ibuprofen, Motrin, Advil, Goody's, BC's, all herbal medications, fish oil, and all  vitamins.    The Morning of Surgery  Do not wear jewelry, make-up or nail polish.  Do not wear lotions, powders, or perfumes, or deodorant  Do not shave 48 hours prior to surgery. .  Do not bring valuables to the hospital.  [facility] is not responsible for any belongings or valuables.  If you are a smoker, DO NOT Smoke 24 hours prior to surgery  IF you wear a CPAP at night please bring your mask, tubing, and machine the morning of surgery   Remember that you must have someone to transport you home after your surgery, and remain with you for 24 hours if you are discharged the same day.  Patients discharged the day of surgery will not be allowed to drive home.  Contacts, glasses, hearing aids, dentures or bridgework may not be worn into surgery.   For patients admitted to the hospital, discharge time will be determined by your treatment team.  Special instructions:   [place]- Preparing For Surgery  Before surgery, you can play an important role. Because skin is not sterile, your skin needs to be as free of germs as possible. You can reduce the number of germs on your skin by washing with CHG (chlorahexidine gluconate) Soap before surgery.  CHG is an antiseptic cleaner which kills germs and bonds with the skin to continue killing germs even after washing.    Oral Hygiene is also  important to reduce your risk of infection.  Remember - BRUSH YOUR TEETH THE MORNING OF SURGERY WITH YOUR REGULAR TOOTHPASTE  Please do not use if you have an allergy to CHG or antibacterial soaps. If your skin becomes reddened/irritated stop using the CHG.  Do not shave (including legs and underarms) for at least 48 hours prior to first CHG shower. It is OK to shave your face.  Please follow these instructions carefully.   1. Shower the [employer] BEFORE SURGERY (Wed) and the MORNING OF SURGERY (Thurs) with CHG Soap.   2. If you chose to wash your hair, wash your hair first as usual with your normal  shampoo.  3. After you shampoo, rinse your hair and body thoroughly to remove the shampoo.  4. Use CHG as you would any other liquid soap. You can apply CHG directly to the skin and wash gently with a scrungie or a clean washcloth.   5. Apply the CHG Soap to your body ONLY FROM THE NECK DOWN.  Do not use on open wounds or open sores. Avoid contact with your eyes, ears, mouth and genitals (private parts). Wash Face and genitals (private parts)  with your normal soap.   6. Wash thoroughly, paying special attention to the area where your surgery will be performed.  7. Thoroughly rinse your body with warm water from the neck down.  8. DO NOT shower/wash with your normal soap after using and rinsing off the CHG Soap.  9. Pat yourself dry with a CLEAN TOWEL.  10. Wear CLEAN PAJAMAS to bed the night before surgery, wear comfortable clothes the morning of surgery  11. Place CLEAN SHEETS on your bed the night of your first shower and DO NOT SLEEP WITH PETS.    Day of Surgery:  Do not apply any deodorants/lotions.  Please wear clean clothes to the hospital/surgery center.   Remember to brush your teeth WITH YOUR REGULAR TOOTHPASTE.   Please read over the following fact sheets that you were given.

## 2018-07-18 ENCOUNTER — Encounter (HOSPITAL_COMMUNITY): Payer: Self-pay

## 2018-07-18 NOTE — Progress Notes (Addendum)
Anesthesia Chart Review:  Case: [id number] Date/Time: 07/20/18 1115   Procedure: 60 GAUGE PARS PLANA VITRECTOMY WITH 20 GAUGE MVR PORT FOR MACULAR HOLE (Left )   Anesthesia type: General   Pre-op diagnosis: vitreous hemorrhage with traction retinal detachment, left eye   Location: [facility] OR ROOM 08 / [place] OR   Surgeon: Bernarda Caffey, MD      DISCUSSION: Patient is a 28 year old female scheduled for the above procedure.  History includes never smoker, DM1 (with retinopathy, nephropathy), HTN, asthma, C-section 12/27/17 (infant born with VSD, requiring NICU stay).  Patient is a DM type 1. She has an insulin pump, but this was on hold for ~ 2 weeks while she was moving across town. She was supplementing with her Novolog pen. She reports that she is getting back on her insulin pump by 07/18/18. Her last A1c on 05/24/18 was elevated at 12.0, although she reports morning fasting CBGs are typically in the 130's.   She was evaluated by cardiologist Dr. Radford Pax on 03/28/18 for SOB in her last trimester of pregnancy--and felt likely related to decreased lung volumes from enlarging uterus and volume overload. Echo showed normal LVEF. Anti-hypertensive medications adjusted. Of note, she had prolonged QTc on prior EKG, but though likely related to hypokalemia. 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 Given risk factor, future ETT planned but not done yet.   Reviewed case with anesthesiologist Lillia Abed, MD. Recommend touching base with Dr. Radford Pax regarding timing of ETT. I have sent her a high priority staff message and also to Dr. Coralyn Pear letting him know of pending cardiology input and of recent A1c of 12.0%.  She had a negative presurgical COVID test on 07/17/18.   Chart will be left for follow-up.  ADDENDUM 07/19/18 9:19 AM:  Dr. Radford Pax responded the "If she has not had any further CP and is able to walk 4 blocks or 2 flights of stairs without SOB or  CP then ok to proceed with surgery." I called and spoke with patient. She reports that she was actually referred to Dr. Radford Pax while she was pregnant, so some of the symptoms like dyspnea had improved following the delivery of her son by the time she had seen Dr. Radford Pax. Her edema has also now improved. However, at that time her son, who was born with congenital anomalies, was still in the NICU and patient was transitioning back to work and was having some intermittent chest pains that seemed to be stress related. Since then her son has undergone surgery and is back at home. She is primarily working remotely. She reports that she is no longer having chest pains. 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 She confirms she is back on her insulin pump and is aware of correction instructions should she be hypo- or hyper- glycemic on the morning of surgery. Based on this information, I would anticipate that she can proceed with planned surgery if no acute changes and fasting CBG acceptable. Anesthesia team to evaluate on the day of surgery.     VS: BP (!) 164/95   Pulse 89   Temp 36.9 C (Oral)   Resp 18   Ht 5\' 6"  (1.676 m)  Wt 77.3 kg   SpO2 100%   BMI 27.52 kg/m     PROVIDERS: Patient, No Pcp Per  Renato Shin, MD is endocrinologist. Last visit 05/24/18.  Fransico Him, MD is cardiologist.    LABS: Preoperative labs noted. Patient hyperglycemic at PAT (no acute symptoms). She injected her own Novolog 8 units while at PAT with plans to resume her insulin pump on 07/16/28 PM. A1c 12.0 05/24/18, 11.5 03/23/18, 6.8 01/29/18. (all labs ordered are listed, but only abnormal results are displayed)  Labs Reviewed  GLUCOSE, CAPILLARY - Abnormal; Notable for the following components:      Result Value    Glucose-Capillary 378 (*)    All other components within normal limits  CBC - Abnormal; Notable for the following components:   Hemoglobin 11.9 (*)    Platelets 434 (*)    All other components within normal limits  BASIC METABOLIC PANEL - Abnormal; Notable for the following components:   Sodium 134 (*)    Glucose, Bld 363 (*)    BUN 23 (*)    All other components within normal limits    IMAGES: CXR 01/22/18: IMPRESSION: No acute abnormalities.   EKG: 03/28/18: NSR   CV: Echo 12/23/17: Study Conclusions - Left ventricle: The cavity size was normal. Systolic function was   normal. The estimated ejection fraction was in the range of 60%   to 65%. Wall motion was normal; there were no regional wall   motion abnormalities. Left ventricular diastolic function   parameters were normal. - Atrial septum: No defect or patent foramen ovale was identified. - Pericardium, extracardiac: A trivial pericardial effusion was   identified.   Past Medical History:  Diagnosis Date  . Asthma 10/30/2013  . Chronic hypertension during pregnancy, antepartum 08/19/2017  . Dehydration 01/28/2018  . Depression during pregnancy, antepartum 07/07/2017   6/20: Short trial of zoloft previously, reports didn't help much but also didn't give it a chance Discussed r/b/a SSRIs in pregnancy, agrees to try Zoloft again, rx sent No SI/HI/red flags  . Diabetes ([place])    TYPE I  . 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  . Hypokalemia 01/22/2018  . Hypomagnesemia 01/28/2018  . Intractable nausea and vomiting 01/22/2018  . Intrauterine growth restriction (IUGR) affecting care of mother 12/22/2017  . Morbid obesity ([place])   . Nephropathy, diabetic ([place]) 12/29/2017  . 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 Severe hyperemesis gravidarum 10/30/2017  . Type I diabetes mellitus ([place])  07/07/2017   Current Diabetic Medications:  Insulin  [x]  Aspirin 81 mg daily after 12 weeks (? A2/B GDM)  Required Referrals for A1GDM or A2GDM: [x]  Diabetes Education and Testing Supplies [x]  Nutrition Cousult  For A2/B GDM or higher classes of DM [x]  Diabetes Education and Testing Supplies [x]  Nutrition Counsult [x]  Fetal ECHO after 20 weeks  [x]  Eye exam for retina evaluation - severe retinopathy 7/19  Base  . Ventricular septal defect (VSD) of fetus in singleton pregnancy, antepartum 09/30/2017   May go to newborn nursery per Dr. Lenard Simmer Echo prior to discharge    Past Surgical History:  Procedure Laterality Date  . CESAREAN SECTION N/A 12/26/2017   Procedure: CESAREAN SECTION;  Surgeon: Osborne Oman, MD;  Location: [facility];  Service: Obstetrics;  Laterality: N/A;  . NO PAST SURGERIES    . WISDOM TOOTH EXTRACTION  MEDICATIONS: . amLODipine (NORVASC) 5 MG tablet  . carvedilol (COREG) 6.25 MG tablet  . ferrous sulfate 325 (65 FE) MG tablet  . insulin aspart (NOVOLOG) 100 UNIT/ML injection  . insulin glargine (LANTUS) 100 unit/mL SOPN  . Insulin Pen Needle (PEN NEEDLES) 32G X 5 MM MISC  . ONETOUCH VERIO test strip  . spironolactone (ALDACTONE) 25 MG tablet   No current facility-administered medications for this encounter.     Myra Gianotti, PA-C Surgical Short Stay/Anesthesiology [facility] Phone [phone] [facility] Phone [phone] 07/18/2018 1:44 PM

## 2018-07-19 HISTORY — PX: EYE SURGERY: SHX253

## 2018-07-19 NOTE — Anesthesia Preprocedure Evaluation (Addendum)
Anesthesia Evaluation  Patient identified by MRN, date of birth, ID band Patient awake    Reviewed: Allergy & Precautions, NPO status , Patient's Chart, lab work & pertinent test results  Airway Mallampati: I  TM Distance: >3 FB Neck ROM: Full    Dental   Pulmonary    Pulmonary exam normal        Cardiovascular hypertension, Pt. on medications Normal cardiovascular exam     Neuro/Psych    GI/Hepatic   Endo/Other  diabetes, Type 1, Insulin Dependent  Renal/GU Renal disease     Musculoskeletal   Abdominal   Peds  Hematology   Anesthesia Other Findings   Reproductive/Obstetrics                            Anesthesia Physical Anesthesia Plan  ASA: III  Anesthesia Plan: General   Post-op Pain Management:    Induction: Intravenous  PONV Risk Score and Plan: 3 and Midazolam, Dexamethasone and Ondansetron  Airway Management Planned: Oral ETT  Additional Equipment:   Intra-op Plan:   Post-operative Plan: Extubation in OR  Informed Consent: I have reviewed the patients History and Physical, chart, labs and discussed the procedure including the risks, benefits and alternatives for the proposed anesthesia with the patient or authorized representative who has indicated his/her understanding and acceptance.       Plan Discussed with: CRNA and Surgeon  Anesthesia Plan Comments: (PAT note written 07/19/2018 by Myra Gianotti, PA-C. )       Anesthesia Quick Evaluation

## 2018-07-20 ENCOUNTER — Encounter (HOSPITAL_COMMUNITY)
Admission: RE | Disposition: A | Discharge status: Home / Self Care | Payer: Self-pay | Source: Home / Self Care | Attending: Ophthalmology

## 2018-07-20 ENCOUNTER — Ambulatory Visit (HOSPITAL_COMMUNITY): Payer: BC Managed Care – PPO | Admitting: Vascular Surgery

## 2018-07-20 ENCOUNTER — Ambulatory Visit (HOSPITAL_COMMUNITY)
Admission: RE | Admit: 2018-07-20 | Discharge: 2018-07-20 | Disposition: A | Discharge status: Home / Self Care | Payer: BC Managed Care – PPO | Attending: Ophthalmology | Admitting: Ophthalmology

## 2018-07-20 ENCOUNTER — Other Ambulatory Visit: Payer: Self-pay

## 2018-07-20 ENCOUNTER — Ambulatory Visit (HOSPITAL_COMMUNITY): Payer: BC Managed Care – PPO | Admitting: Anesthesiology

## 2018-07-20 ENCOUNTER — Encounter (HOSPITAL_COMMUNITY): Payer: Self-pay | Admitting: Anesthesiology

## 2018-07-20 DIAGNOSIS — I10 Essential (primary) hypertension: Secondary | ICD-10-CM | POA: Insufficient documentation

## 2018-07-20 DIAGNOSIS — Z6827 Body mass index (BMI) 27.0-27.9, adult: Secondary | ICD-10-CM | POA: Diagnosis not present

## 2018-07-20 DIAGNOSIS — Z794 Long term (current) use of insulin: Secondary | ICD-10-CM | POA: Insufficient documentation

## 2018-07-20 DIAGNOSIS — E1021 Type 1 diabetes mellitus with diabetic nephropathy: Secondary | ICD-10-CM | POA: Diagnosis not present

## 2018-07-20 DIAGNOSIS — E103591 Type 1 diabetes mellitus with proliferative diabetic retinopathy without macular edema, right eye: Secondary | ICD-10-CM | POA: Diagnosis not present

## 2018-07-20 DIAGNOSIS — E103511 Type 1 diabetes mellitus with proliferative diabetic retinopathy with macular edema, right eye: Secondary | ICD-10-CM | POA: Diagnosis not present

## 2018-07-20 DIAGNOSIS — H4312 Vitreous hemorrhage, left eye: Secondary | ICD-10-CM | POA: Diagnosis not present

## 2018-07-20 DIAGNOSIS — Z79899 Other long term (current) drug therapy: Secondary | ICD-10-CM | POA: Insufficient documentation

## 2018-07-20 DIAGNOSIS — E103522 Type 1 diabetes mellitus with proliferative diabetic retinopathy with traction retinal detachment involving the macula, left eye: Secondary | ICD-10-CM | POA: Insufficient documentation

## 2018-07-20 HISTORY — PX: MITOMYCIN C APPLICATION: SHX6375

## 2018-07-20 HISTORY — PX: LASER PHOTO ABLATION: SHX5942

## 2018-07-20 HISTORY — PX: INJECTION OF SILICONE OIL: SHX6422

## 2018-07-20 HISTORY — PX: MEMBRANE PEEL: SHX5967

## 2018-07-20 HISTORY — PX: EYE EXAMINATION UNDER ANESTHESIA: SHX1560

## 2018-07-20 HISTORY — PX: 25 GAUGE PARS PLANA VITRECTOMY WITH 20 GAUGE MVR PORT FOR MACULAR HOLE: SHX6096

## 2018-07-20 HISTORY — PX: RETINAL DETACHMENT SURGERY: SHX105

## 2018-07-20 HISTORY — PX: PHOTOCOAGULATION WITH LASER: SHX6027

## 2018-07-20 LAB — GLUCOSE, CAPILLARY
Glucose-Capillary: 145 mg/dL — ABNORMAL HIGH (ref 70–99)
Glucose-Capillary: 127 mg/dL — ABNORMAL HIGH (ref 70–99)

## 2018-07-20 LAB — POCT PREGNANCY, URINE: Preg Test, Ur: NEGATIVE

## 2018-07-20 SURGERY — 25 GAUGE PARS PLANA VITRECTOMY WITH 20 GAUGE MVR PORT FOR MACULAR HOLE
Anesthesia: General | Site: Eye | Laterality: Right

## 2018-07-20 MED ORDER — PROPARACAINE HCL 0.5 % OP SOLN
OPHTHALMIC | Status: AC
  Administered 2018-07-20: 1 [drp] via OPHTHALMIC
  Filled 2018-07-20: qty 15

## 2018-07-20 MED ORDER — TROPICAMIDE 1 % OP SOLN
OPHTHALMIC | Status: AC
  Administered 2018-07-20: 11:00:00 1 [drp] via OPHTHALMIC
  Filled 2018-07-20: qty 15

## 2018-07-20 MED ORDER — BSS PLUS IO SOLN
INTRAOCULAR | Status: AC
  Filled 2018-07-20: qty 500

## 2018-07-20 MED ORDER — PREDNISOLONE ACETATE 1 % OP SUSP
OPHTHALMIC | Status: DC | PRN
  Administered 2018-07-20: 1 [drp] via OPHTHALMIC

## 2018-07-20 MED ORDER — NA CHONDROIT SULF-NA HYALURON 40-30 MG/ML IO SOLN
INTRAOCULAR | Status: DC | PRN
  Administered 2018-07-20: 0.5 mL via INTRAOCULAR

## 2018-07-20 MED ORDER — ATROPINE SULFATE 1 % OP SOLN
OPHTHALMIC | Status: AC
  Administered 2018-07-20: 1 [drp] via OPHTHALMIC
  Filled 2018-07-20: qty 2

## 2018-07-20 MED ORDER — ONDANSETRON HCL 4 MG/2ML IJ SOLN
INTRAMUSCULAR | Status: AC
  Filled 2018-07-20: qty 2

## 2018-07-20 MED ORDER — ONDANSETRON HCL 4 MG/2ML IJ SOLN
4.0000 mg | Freq: Once | INTRAMUSCULAR | Status: AC | PRN
  Administered 2018-07-20: 4 mg via INTRAVENOUS

## 2018-07-20 MED ORDER — TROPICAMIDE 1 % OP SOLN
1.0000 [drp] | OPHTHALMIC | Status: AC
  Administered 2018-07-20 (×3): 1 [drp] via OPHTHALMIC

## 2018-07-20 MED ORDER — ROCURONIUM BROMIDE 10 MG/ML (PF) SYRINGE
PREFILLED_SYRINGE | INTRAVENOUS | Status: DC | PRN
  Administered 2018-07-20: 80 mg via INTRAVENOUS

## 2018-07-20 MED ORDER — BUPIVACAINE HCL (PF) 0.75 % IJ SOLN
INTRAMUSCULAR | Status: AC
  Filled 2018-07-20: qty 10

## 2018-07-20 MED ORDER — LIDOCAINE 2% (20 MG/ML) 5 ML SYRINGE
INTRAMUSCULAR | Status: AC
  Filled 2018-07-20: qty 5

## 2018-07-20 MED ORDER — ATROPINE SULFATE 1 % OP SOLN
OPHTHALMIC | Status: AC
  Filled 2018-07-20: qty 5

## 2018-07-20 MED ORDER — FENTANYL CITRATE (PF) 100 MCG/2ML IJ SOLN
INTRAMUSCULAR | Status: DC | PRN
  Administered 2018-07-20: 100 ug via INTRAVENOUS

## 2018-07-20 MED ORDER — SUGAMMADEX SODIUM 200 MG/2ML IV SOLN
INTRAVENOUS | Status: DC | PRN
  Administered 2018-07-20: 154.6 mg via INTRAVENOUS

## 2018-07-20 MED ORDER — LIDOCAINE HCL (PF) 2 % IJ SOLN
INTRAMUSCULAR | Status: AC
  Filled 2018-07-20: qty 10

## 2018-07-20 MED ORDER — PROPOFOL 10 MG/ML IV BOLUS
INTRAVENOUS | Status: DC | PRN
  Administered 2018-07-20: 120 mg via INTRAVENOUS

## 2018-07-20 MED ORDER — DEXAMETHASONE SODIUM PHOSPHATE 4 MG/ML IJ SOLN
INTRAMUSCULAR | Status: DC | PRN
  Administered 2018-07-20: 5 mg via INTRAVENOUS

## 2018-07-20 MED ORDER — BRIMONIDINE TARTRATE 0.2 % OP SOLN
OPHTHALMIC | Status: AC
  Filled 2018-07-20: qty 5

## 2018-07-20 MED ORDER — BACITRACIN-POLYMYXIN B 500-10000 UNIT/GM OP OINT
TOPICAL_OINTMENT | OPHTHALMIC | Status: DC | PRN
  Administered 2018-07-20: 1 via OPHTHALMIC

## 2018-07-20 MED ORDER — CARBACHOL 0.01 % IO SOLN
INTRAOCULAR | Status: AC
  Filled 2018-07-20: qty 1.5

## 2018-07-20 MED ORDER — BSS IO SOLN
INTRAOCULAR | Status: AC
  Filled 2018-07-20: qty 30

## 2018-07-20 MED ORDER — LIDOCAINE HCL (PF) 4 % IJ SOLN
INTRAMUSCULAR | Status: AC
  Filled 2018-07-20: qty 5

## 2018-07-20 MED ORDER — EPINEPHRINE PF 1 MG/ML IJ SOLN
INTRAMUSCULAR | Status: AC
  Filled 2018-07-20: qty 1

## 2018-07-20 MED ORDER — SODIUM CHLORIDE 0.9 % IV SOLN
INTRAVENOUS | Status: DC
  Administered 2018-07-20 (×2): via INTRAVENOUS

## 2018-07-20 MED ORDER — STERILE WATER FOR IRRIGATION IR SOLN
Status: DC | PRN
  Administered 2018-07-20: 200 mL

## 2018-07-20 MED ORDER — STERILE WATER FOR INJECTION IJ SOLN
INTRAMUSCULAR | Status: AC
  Filled 2018-07-20: qty 10

## 2018-07-20 MED ORDER — PROPARACAINE HCL 0.5 % OP SOLN
1.0000 [drp] | Freq: Once | OPHTHALMIC | Status: AC
  Administered 2018-07-20: 1 [drp] via OPHTHALMIC

## 2018-07-20 MED ORDER — EPINEPHRINE PF 1 MG/ML IJ SOLN
INTRAOCULAR | Status: DC | PRN
  Administered 2018-07-20 (×2): 500 mL

## 2018-07-20 MED ORDER — DORZOLAMIDE HCL-TIMOLOL MAL 2-0.5 % OP SOLN
OPHTHALMIC | Status: AC
  Filled 2018-07-20: qty 10

## 2018-07-20 MED ORDER — ROCURONIUM BROMIDE 10 MG/ML (PF) SYRINGE
PREFILLED_SYRINGE | INTRAVENOUS | Status: AC
  Filled 2018-07-20: qty 10

## 2018-07-20 MED ORDER — FENTANYL CITRATE (PF) 250 MCG/5ML IJ SOLN
INTRAMUSCULAR | Status: AC
  Filled 2018-07-20: qty 5

## 2018-07-20 MED ORDER — CEFTAZIDIME 1 G IJ SOLR
INTRAMUSCULAR | Status: AC
  Filled 2018-07-20: qty 1

## 2018-07-20 MED ORDER — PREDNISOLONE ACETATE 1 % OP SUSP
OPHTHALMIC | Status: AC
  Filled 2018-07-20: qty 5

## 2018-07-20 MED ORDER — ATROPINE SULFATE 1 % OP SOLN
1.0000 [drp] | OPHTHALMIC | Status: AC
  Administered 2018-07-20 (×3): 1 [drp] via OPHTHALMIC

## 2018-07-20 MED ORDER — MIDAZOLAM HCL 5 MG/5ML IJ SOLN
INTRAMUSCULAR | Status: DC | PRN
  Administered 2018-07-20: 2 mg via INTRAVENOUS

## 2018-07-20 MED ORDER — ATROPINE SULFATE 1 % OP OINT: TOPICAL_OINTMENT | OPHTHALMIC | Status: DC

## 2018-07-20 MED ORDER — MEPERIDINE HCL 25 MG/ML IJ SOLN: 6.2500 mg | INTRAMUSCULAR | Status: DC | PRN

## 2018-07-20 MED ORDER — BRIMONIDINE TARTRATE 0.2 % OP SOLN
OPHTHALMIC | Status: DC | PRN
  Administered 2018-07-20: 1 [drp] via OPHTHALMIC

## 2018-07-20 MED ORDER — DEXAMETHASONE SODIUM PHOSPHATE 10 MG/ML IJ SOLN
INTRAMUSCULAR | Status: AC
  Filled 2018-07-20: qty 1

## 2018-07-20 MED ORDER — PHENYLEPHRINE HCL 10 % OP SOLN
1.0000 [drp] | OPHTHALMIC | Status: AC
  Administered 2018-07-20 (×3): 1 [drp] via OPHTHALMIC

## 2018-07-20 MED ORDER — ONDANSETRON HCL 4 MG/2ML IJ SOLN
INTRAMUSCULAR | Status: DC | PRN
  Administered 2018-07-20: 4 mg via INTRAVENOUS

## 2018-07-20 MED ORDER — GATIFLOXACIN 0.5 % OP SOLN OPTIME - NO CHARGE
OPHTHALMIC | Status: DC | PRN
  Administered 2018-07-20 (×2): 1 [drp] via OPHTHALMIC

## 2018-07-20 MED ORDER — PROPOFOL 10 MG/ML IV BOLUS
INTRAVENOUS | Status: AC
  Filled 2018-07-20: qty 20

## 2018-07-20 MED ORDER — SODIUM CHLORIDE 0.9 % IV SOLN
INTRAVENOUS | Status: DC | PRN
  Administered 2018-07-20: 50 ug/min via INTRAVENOUS

## 2018-07-20 MED ORDER — NA CHONDROIT SULF-NA HYALURON 40-30 MG/ML IO SOLN
INTRAOCULAR | Status: AC
  Filled 2018-07-20: qty 1

## 2018-07-20 MED ORDER — BACITRACIN-POLYMYXIN B 500-10000 UNIT/GM OP OINT
TOPICAL_OINTMENT | OPHTHALMIC | Status: AC
  Filled 2018-07-20: qty 3.5

## 2018-07-20 MED ORDER — GATIFLOXACIN 0.5 % OP SOLN
OPHTHALMIC | Status: AC
  Filled 2018-07-20: qty 2.5

## 2018-07-20 MED ORDER — BEVACIZUMAB CHEMO INJECTION 1.25MG/0.05ML SYRINGE FOR KALEIDOSCOPE
INTRAVITREAL | Status: DC | PRN
  Administered 2018-07-20 (×2): 1.25 mg via INTRAVITREAL

## 2018-07-20 MED ORDER — SODIUM CHLORIDE (PF) 0.9 % IJ SOLN
INTRAMUSCULAR | Status: AC
  Filled 2018-07-20: qty 10

## 2018-07-20 MED ORDER — 0.9 % SODIUM CHLORIDE (POUR BTL) OPTIME
TOPICAL | Status: DC | PRN
  Administered 2018-07-20: 1000 mL

## 2018-07-20 MED ORDER — POLYMYXIN B SULFATE 500000 UNITS IJ SOLR
INTRAMUSCULAR | Status: AC
  Filled 2018-07-20: qty 500000

## 2018-07-20 MED ORDER — TRIAMCINOLONE ACETONIDE 40 MG/ML IJ SUSP
INTRAMUSCULAR | Status: AC
  Filled 2018-07-20: qty 5

## 2018-07-20 MED ORDER — TOBRAMYCIN-DEXAMETHASONE 0.3-0.1 % OP OINT
TOPICAL_OINTMENT | OPHTHALMIC | Status: AC
  Filled 2018-07-20: qty 3.5

## 2018-07-20 MED ORDER — MIDAZOLAM HCL 2 MG/2ML IJ SOLN
INTRAMUSCULAR | Status: AC
  Filled 2018-07-20: qty 2

## 2018-07-20 MED ORDER — DORZOLAMIDE HCL-TIMOLOL MAL 2-0.5 % OP SOLN
OPHTHALMIC | Status: DC | PRN
  Administered 2018-07-20: 1 [drp] via OPHTHALMIC

## 2018-07-20 MED ORDER — BALANCED SALT IO SOLN
INTRAOCULAR | Status: DC | PRN
  Administered 2018-07-20: 15 mL via INTRAOCULAR

## 2018-07-20 MED ORDER — HYDROMORPHONE HCL 1 MG/ML IJ SOLN: 0.2500 mg | INTRAMUSCULAR | Status: DC | PRN

## 2018-07-20 MED ORDER — PHENYLEPHRINE HCL 10 % OP SOLN
OPHTHALMIC | Status: AC
  Administered 2018-07-20: 1 [drp] via OPHTHALMIC
  Filled 2018-07-20: qty 5

## 2018-07-20 MED ORDER — BEVACIZUMAB CHEMO INJECTION 1.25MG/0.05ML SYRINGE FOR KALEIDOSCOPE
1.2500 mg | Freq: Once | INTRAVITREAL | Status: DC
  Filled 2018-07-20 (×2): qty 0.1

## 2018-07-20 MED ORDER — LIDOCAINE 2% (20 MG/ML) 5 ML SYRINGE
INTRAMUSCULAR | Status: DC | PRN
  Administered 2018-07-20: 60 mg via INTRAVENOUS

## 2018-07-20 MED ORDER — ATROPINE SULFATE 1 % OP SOLN: OPHTHALMIC | Status: DC | PRN

## 2018-07-20 MED ORDER — STERILE WATER FOR INJECTION IJ SOLN
INTRAMUSCULAR | Status: DC | PRN
  Administered 2018-07-20: 20 mL

## 2018-07-20 MED ORDER — TRIAMCINOLONE ACETONIDE 40 MG/ML IJ SUSP
INTRAMUSCULAR | Status: DC | PRN
  Administered 2018-07-20: 40 mg

## 2018-07-20 SURGICAL SUPPLY — 54 items
APPLICATOR COTTON TIP 6 STRL (MISCELLANEOUS) ×12 IMPLANT
APPLICATOR COTTON TIP 6IN STRL (MISCELLANEOUS) ×20
BLADE MVR KNIFE 20G (BLADE) ×5 IMPLANT
BNDG EYE OVAL (GAUZE/BANDAGES/DRESSINGS) ×5 IMPLANT
CABLE BIPOLOR RESECTION CORD (MISCELLANEOUS) ×5 IMPLANT
CANNULA FLEX TIP 25G (CANNULA) ×5 IMPLANT
CLOSURE STERI-STRIP 1/2X4 (GAUZE/BANDAGES/DRESSINGS) ×1
CLSR STERI-STRIP ANTIMIC 1/2X4 (GAUZE/BANDAGES/DRESSINGS) ×4 IMPLANT
COVER WAND RF STERILE (DRAPES) ×5 IMPLANT
DRAPE MICROSCOPE LEICA 46X105 (MISCELLANEOUS) ×5 IMPLANT
DRAPE OPHTHALMIC 77X100 STRL (CUSTOM PROCEDURE TRAY) ×5 IMPLANT
FORCEPS GRIESHABER MAX 25G (MISCELLANEOUS) ×2 IMPLANT
GLOVE BIO SURGEON STRL SZ7.5 (GLOVE) ×10 IMPLANT
GLOVE BIOGEL M 7.0 STRL (GLOVE) ×5 IMPLANT
GLOVE SURG SS PI 6.0 STRL IVOR (GLOVE) ×4 IMPLANT
GLOVE SURG SS PI 7.0 STRL IVOR (GLOVE) ×2 IMPLANT
GOWN STRL REUS W/ TWL LRG LVL3 (GOWN DISPOSABLE) ×6 IMPLANT
GOWN STRL REUS W/ TWL XL LVL3 (GOWN DISPOSABLE) ×3 IMPLANT
GOWN STRL REUS W/TWL LRG LVL3 (GOWN DISPOSABLE) ×4
GOWN STRL REUS W/TWL XL LVL3 (GOWN DISPOSABLE) ×2
KIT BASIN OR (CUSTOM PROCEDURE TRAY) ×5 IMPLANT
LENS VITRECTOMY FLAT OCLR DISP (MISCELLANEOUS) ×2 IMPLANT
NDL 18GX1X1/2 (RX/OR ONLY) (NEEDLE) ×3 IMPLANT
NDL 25GX 5/8IN NON SAFETY (NEEDLE) ×15 IMPLANT
NDL HYPO 30X.5 LL (NEEDLE) ×6 IMPLANT
NEEDLE 18GX1X1/2 (RX/OR ONLY) (NEEDLE) ×5 IMPLANT
NEEDLE 25GX 5/8IN NON SAFETY (NEEDLE) ×25 IMPLANT
NEEDLE HYPO 30X.5 LL (NEEDLE) ×15 IMPLANT
NS IRRIG 1000ML POUR BTL (IV SOLUTION) ×5 IMPLANT
OIL SILICONE OPHTHALMIC 1000 (Ophthalmic Related) ×2 IMPLANT
PACK VITRECTOMY CUSTOM (CUSTOM PROCEDURE TRAY) ×5 IMPLANT
PAD ARMBOARD 7.5X6 YLW CONV (MISCELLANEOUS) ×10 IMPLANT
PAK PIK VITRECTOMY CVS 25GA (OPHTHALMIC) ×5 IMPLANT
PENCIL BIPOLAR 25GA STR DISP (OPHTHALMIC RELATED) ×5 IMPLANT
PIC ILLUMINATED 25G (OPHTHALMIC) ×5
PIK ILLUMINATED 25G (OPHTHALMIC) IMPLANT
PROBE ENDO DIATHERMY 25G (MISCELLANEOUS) ×5 IMPLANT
PROBE LASER ILLUM FLEX CVD 25G (OPHTHALMIC) ×2 IMPLANT
SCISSORS TIP ADVANCED DSP 25GA (INSTRUMENTS) ×2 IMPLANT
SET INJECTOR OIL FLUID CONSTEL (OPHTHALMIC) ×2 IMPLANT
SUT CHROMIC 7 0 TG140 8 (SUTURE) ×5 IMPLANT
SUT ETHILON 5.0 S-24 (SUTURE) ×5 IMPLANT
SUT ETHILON 9 0 TG140 8 (SUTURE) ×5 IMPLANT
SUT SILK 2 0 TIES 17X18 (SUTURE) ×2
SUT SILK 2-0 18XBRD TIE BLK (SUTURE) ×3 IMPLANT
SUT VICRYL 7 0 TG140 8 (SUTURE) ×5 IMPLANT
SYR 10ML LL (SYRINGE) ×5 IMPLANT
SYR 20CC LL (SYRINGE) ×5 IMPLANT
SYR 5ML LL (SYRINGE) ×5 IMPLANT
SYR BULB 3OZ (MISCELLANEOUS) ×5 IMPLANT
SYR TB 1ML LUER SLIP (SYRINGE) ×2 IMPLANT
TOWEL NATURAL 6PK STERILE (DISPOSABLE) ×5 IMPLANT
TUBING HIGH PRESS EXTEN 6IN (TUBING) ×5 IMPLANT
WATER STERILE IRR 1000ML POUR (IV SOLUTION) ×5 IMPLANT

## 2018-07-20 NOTE — Transfer of Care (Signed)
Immediate Anesthesia Transfer of Care Note  Patient: Barbara Donovan  Procedure(s) Performed: 25 GAUGE PARS PLANA VITRECTOMY LEFT EYE (Left Eye) Laser Photo Ablation RIGHT EYE (Right Eye) Photocoagulation With Laser LEFT EYE (Left Eye) Injection Of Silicone Oil LEFT EYE (Left Eye) Avastin Application (Bilateral Eye) Membrane Peel LEFT EYE (Left Eye) Eye Exam Under Anesthesia RIGHT EYE (Right Eye)  Patient Location: PACU  Anesthesia Type:General  Level of Consciousness: drowsy and patient cooperative  Airway & Oxygen Therapy: Patient Spontanous Breathing and Patient connected to face mask oxygen  Post-op Assessment: Report given to RN and Post -op Vital signs reviewed and stable  Post vital signs: Reviewed and stable  Last Vitals:  Vitals Value Taken Time  BP 162/95 07/20/18 1605  Temp    Pulse 96 07/20/18 1609  Resp 19 07/20/18 1609  SpO2 99 % 07/20/18 1609  Vitals shown include unvalidated device data.  Last Pain:  Vitals:   07/20/18 1030  TempSrc:   PainSc: 0-No pain         Complications: No apparent anesthesia complications

## 2018-07-20 NOTE — Interval H&P Note (Signed)
History and Physical Interval Note:  07/20/2018 12:23 PM  Barbara Donovan  has presented today for surgery, with the diagnosis of vitreous hemorrhage with traction retinal detachment, left eye.  The various methods of treatment have been discussed with the patient and family. After consideration of risks, benefits and other options for treatment, the patient has consented to  Procedure(s): 25 GAUGE PARS PLANA VITRECTOMY (Left) Laser Photo Ablation RIGHT EYE (Right) Photocoagulation With Laser LEFT EYE (Left) as a surgical intervention.  The patient's history has been reviewed, patient examined, no change in status, stable for surgery.  I have reviewed the patient's chart and labs.  Questions were answered to the patient's satisfaction.     Bernarda Caffey

## 2018-07-20 NOTE — Brief Op Note (Signed)
07/20/2018  4:02 PM  PATIENT:  [name]  28 y.o. female  PRE-OPERATIVE DIAGNOSIS:  vitreous hemorrhage with traction retinal detachment, left eye  POST-OPERATIVE DIAGNOSIS:  vitreous hemorrhage with traction retinal detachment, left eye  PROCEDURE:  Procedure(s): 25 GAUGE PARS PLANA VITRECTOMY LEFT EYE (Left) Laser Photo Ablation RIGHT EYE (Right) Photocoagulation With Laser LEFT EYE (Left) Injection Of Silicone Oil LEFT EYE (Left) Avastin Application (Bilateral) Membrane Peel LEFT EYE (Left) Eye Exam Under Anesthesia RIGHT EYE (Right)  SURGEON:  Surgeon(s) and Role:    Bernarda Caffey, MD - Primary  ASSISTANTS: Ernest Mallick, Ophthalmic Assistant  ANESTHESIA:   general  EBL:  10 mL   BLOOD ADMINISTERED:none  DRAINS: none   LOCAL MEDICATIONS USED:  NONE  SPECIMEN:  No Specimen  DISPOSITION OF SPECIMEN:  N/A  COUNTS:  YES  TOURNIQUET:  * No tourniquets in log *  DICTATION: .Note written in EPIC  PLAN OF CARE: Discharge to home after PACU  PATIENT DISPOSITION:  PACU - hemodynamically stable.   Delay start of Pharmacological VTE agent (>24hrs) due to surgical blood loss or risk of bleeding: not applicable

## 2018-07-20 NOTE — Discharge Instructions (Addendum)
POSTOPERATIVE INSTRUCTIONS  Your doctor has performed vitreoretinal surgery on you at [facility]. [place] eye patched and shielded until seen by Dr. Coralyn Pear 845 AM tomorrow in clinic - Do not use drops until return - [place] - Sleep with belly down or on right side, avoid laying flat on back.    - No strenuous bending, stooping or lifting.  - You may not drive until further notice.  - If your doctor used a gas bubble in your eye during the procedure he will advise you on postoperative positioning. If you have a gas bubble you will be wearing a green bracelet that was applied in the operating room. The green bracelet should stay on as long as the gas bubble is in your eye. While the gas bubble is present you should not fly in an airplane. If you require general anesthesia while the gas bubble is present you must notify your anesthesiologist that an intraocular gas bubble is present so he can take the appropriate precautions.  - Tylenol or any other over-the-counter pain reliever can be used according to your doctor. If more pain medicine is required, your doctor will have a prescription for you.  - You may read, go up and down stairs, and watch television.     Bernarda Caffey, M.D., Ph.D.

## 2018-07-20 NOTE — Progress Notes (Signed)
Pt aware of CBG 127. Pt confirmed insulin pump is on prior to discharge.

## 2018-07-20 NOTE — Anesthesia Procedure Notes (Addendum)
Procedure Name: Intubation Date/Time: 07/20/2018 11:42 AM Performed by: Lieutenant Diego, CRNA Pre-anesthesia Checklist: Patient identified, Emergency Drugs available, Suction available, Patient being monitored and Timeout performed Patient Re-evaluated:Patient Re-evaluated prior to induction Oxygen Delivery Method: Circle system utilized Preoxygenation: Pre-oxygenation with 100% oxygen Induction Type: IV induction Ventilation: Mask ventilation without difficulty Laryngoscope Size: Miller and 2 Grade View: Grade II Tube type: Oral Number of attempts: 2 Airway Equipment and Method: Stylet and Oral airway Placement Confirmation: ETT inserted through vocal cords under direct vision,  positive ETCO2 and breath sounds checked- equal and bilateral Secured at: 21 cm Tube secured with: Tape Dental Injury: Teeth and Oropharynx as per pre-operative assessment

## 2018-07-20 NOTE — Op Note (Signed)
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 Exam under anesthesia, RIGHT EYE 2. Fill-in panretinal photocoagulation via indirect laser ophthalmoscopy, RIGHT EYE 3. Intravitreal injection of bevacizumab, RIGHT EYE  LEFT EYE 1. 25 gauge pars plana vitrectomy,LEFT EYE 2. Preretinal membrane peel, LEFT EYE 3. Endolaser, LEFT EYE 4. Fluid air exchange, LEFT EYE 5. Injection of [id number] silicon oil, LEFT EYE 6. Intravitreal injection of bevacizumab, LEFT EYE   Indications for procedure: This is a 28 yo F with a history of DM1 and HTN with Proliferative diabetic retinopathy in both eyes, and left eye with vitreous hemorrhage and tractional retinal detachment. The patient presented with a 6 month history of decreased vision, left eye.After discussing the risks, benefits, and alternatives to surgery, the patient electively decided to proceed with surgery and informed consent was obtained.The surgery was an attempt to remove all preretinal/tractional membranes from the left eye and potentially improve the vision within the reasonable expectations of the surgeon.The goal of the procedures in the right eye was to fill-in and supplement her panretinal photocoagulation and treat with intravitreal Avastin to further stabilize the eye and prevent any further vision loss from diabetic retinopathy.  Procedure in Detail:  The patient was met in the pre-operative holding area where their identification data was verified.It was noted that there was a signed, informed consent in the chart and both eyes were verbally verified by the patient--right eye for laser and left eye for surgery. Then  the operative left eye and was marked with the surgeon's initials with a marking pen and the right eye was marked with word "laser."The patient was then taken to the operating room and placed in the supine position.General endotracheal anesthesia was induced.  Attention was first turned to the indirect laser ophthalmoscopy portion of the procedure. Fill in scatter panretinal photocoagulation was performed 360 degrees to the peripheral retina of the RIGHT EYE using the laser indirect ophthalmoscope with scleral depression.1309spots were placed at 200 mW power, 150 ms duration.Following completion of laser PRP, the eye was prepped with 5% betadine and 0.05 cc of bevacizumab was injected into the vitreous cavity via 30g needle, 74m posterior to the limbus in the inferotemporal quadrant.  5% betadine was reapplied to the injection site and then rinsed out of the eye with sterile BSS. 1 drop of gatifloxacin antibiotic was applied to the right eye and the eyelid was patched and taped closed.  The LEFT EYE was then prepped with 5% betadine and draped in the normal fashion for ophthalmic surgery.The microscope was draped and swung into position, and a secondary time-out was performed to identify the correct patient, eyes, procedures, and any allergies.  A 25 gauge trocar was inserted in a 30-45 degrees fashion into the inferotemporal quadrant 46mposterior tothelimbus in this phakic patient. Correct positioning within the vitreous was verified externally with the light pipe.The infusion was then connected to the cannula and BSS infusion was commenced.Additional ports were placed in the superonasal and superotemporal quadrants. Viscoat was placed on the cornea. The BIOMwas used to visualize the posterior segment. The patient had diffuse, old, white vitreous hemorrhage obscuring the posterior pole. A core vitrectomy was performed using the BIOM visualization system, vitrectomy probe and light pipe to see  the posterior pole. Once  the chronic vitreous hemorrhage was cleared, we were able to appreciate a dense preretinal tractional fibrotic membrane/plaque emanating from the optic disc and along the vascular arcades and macula.  Kenalog was used to mark the vitreous and assist in dissecting vitreous membranes.    A macular contact lens was placed on the eye. End-grasping ILM forceps, MaxGrip forceps, 25g endoilluminated pik, 25g intraocular scissors and the vitrectomy probe and light pipe were used to carefully peel and dissect preretinal membranes and fibrosis off the entire surface of the retina as was deemed safe.   The wide angle viewing system was brought back into position to assist in peeling peripheral membranes. No retinal tears were noted. At this time, panretinal photocoagulation was performed via endolaser 360 and posteriorly almost to the arcades. An air fluid exchange was then performed to remove the intravitreal BSS and dry the retina.Then, [id number] siliconoil wasinjected via the superior temporaltrocar and filled up to the level of the lens plane with the aide of the venting cannula.  The inferonasal and superonasal ports were then removed and sutured with 7-0 vicryl, there was no leakage. Silicon oil was injected through the superotemporal port to bring the eye to physiologic IOP. Then, the superotemporal port was removed and sutured with 7-0 vicryl.There was no leakage from the sclerotomy sites and the eye remained at physiologic pressure by digital palpation.  Subconjunctival injections of kefzol+ bacitracin + polymixin b and kenalogwere then administered. The drapes were removed, the lid speculum was replaced and the eye was prepped again with 5% betadine. Next, 0.05 cc of bevacizumab was injected into the vitreous cavity via 30g needle, 35m posterior to the limbus in the inferotemporal quadrant.  5% betadine was reapplied to the injection site and then rinsed out of the eye  with sterile BSS. Antibiotic and steroid drops as well as antibiotic ointment were placed in the eye and then the eye was patched and shielded.The patient was then taken to the post-operative area for recovery having tolerated the procedure well.She was instructed to perform face down positioning postoperatively and to follow up in clinic the following morning as scheduled.  Estimate blood lost:none Complications: None

## 2018-07-20 NOTE — Progress Notes (Signed)
[facility] Note  07/21/2018     CHIEF COMPLAINT Patient presents for Post-op Follow-up   HISTORY OF PRESENT ILLNESS: Barbara Donovan is a 28 y.o. female who presents to the clinic today for:   HPI    Post-op Follow-up    In both eyes.  Discomfort includes pain.  Vision is blurred at distance.  I, the attending physician,  performed the HPI with the patient and updated documentation appropriately.          Comments    Patient states she is in a lot of pain this morning and was in a lot of pain last night (OU).  Patient states pain is 9/10.  Patient states she cannot open her right or her left eye today.         Last edited by Bernarda Caffey, MD on 07/21/2018  9:06 AM. (History)      Referring physician: No referring provider defined for this encounter.  HISTORICAL INFORMATION:   Selected notes from the MEDICAL RECORD NUMBER Referred by Dr. [name] for concern of bilateral CRVO LEE-  Ocular Hx-  PMH-     CURRENT MEDICATIONS: No current outpatient medications on file. (Ophthalmic Drugs)   No current facility-administered medications for this visit.  (Ophthalmic Drugs)   Current Outpatient Medications (Other)  Medication Sig  . amLODipine (NORVASC) 5 MG tablet Take 1 tablet (5 mg total) by mouth daily. For Blood Pressure (Patient not taking: Reported on 07/10/2018)  . carvedilol (COREG) 6.25 MG tablet Take 1 tablet (6.25 mg total) by mouth 2 (two) times daily.  . ferrous sulfate 325 (65 FE) MG tablet Take 1 tablet (325 mg total) by mouth 2 (two) times daily with a meal. (Patient not taking: Reported on 07/10/2018)  . insulin aspart (NOVOLOG) 100 UNIT/ML injection For use in pump, total of 60 units per day. (Patient taking differently: Inject 60 Units into the skin daily. )  . insulin glargine (LANTUS) 100 unit/mL SOPN Inject 30 Units into the skin at bedtime.  . Insulin Pen Needle (PEN NEEDLES) 32G X 5 MM MISC 1 Device by Does not apply route 4  (four) times daily. (Patient not taking: Reported on 07/10/2018)  . ONETOUCH VERIO test strip USED TO CHECK BLOOD SUGARS FIVE TIMES DAILY. (Patient not taking: Reported on 07/10/2018)  . spironolactone (ALDACTONE) 25 MG tablet Take 1 tablet (25 mg total) by mouth daily. (Patient not taking: Reported on 07/10/2018)   No current facility-administered medications for this visit.  (Other)      REVIEW OF SYSTEMS: ROS    Positive for: Endocrine, Cardiovascular, Eyes   Negative for: Constitutional, Gastrointestinal, Neurological, Skin, Genitourinary, Musculoskeletal, HENT, Respiratory, Psychiatric, Allergic/Imm, Heme/Lymph   Last edited by Doneen Poisson on 07/21/2018  8:45 AM. (History)       ALLERGIES No Known Allergies  PAST MEDICAL HISTORY Past Medical History:  Diagnosis Date  . Asthma 10/30/2013  . Chronic hypertension during pregnancy, antepartum 08/19/2017  . Dehydration 01/28/2018  . Depression during pregnancy, antepartum 07/07/2017   6/20: Short trial of zoloft previously, reports didn't help much but also didn't give it a chance Discussed r/b/a SSRIs in pregnancy, agrees to try Zoloft again, rx sent No SI/HI/red flags  . Diabetes ([place])    TYPE I  . Diabetic retinopathy ([place]) 06/09/2017   07/2017 with bilateral severe diabetic non-proliferative retinopathy with macular edema.  Marland Kitchen HTN (hypertension)   . Hypokalemia 01/22/2018  . Hypomagnesemia 01/28/2018  .  Intractable nausea and vomiting 01/22/2018  . Intrauterine growth restriction (IUGR) affecting care of mother 12/22/2017  . Morbid obesity ([place])   . 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 Proteinuria due to type 1 diabetes mellitus ([place]) 11/02/2017   Baseline Pr: Cr 10.23  . Severe hyperemesis gravidarum 10/30/2017  . Type I diabetes mellitus ([place]) 07/07/2017   Current Diabetic Medications:  Insulin  [x]  Aspirin 81 mg daily after 12 weeks (? A2/B GDM)  Required Referrals for A1GDM or A2GDM: [x]  Diabetes Education and Testing  Supplies [x]  Nutrition Cousult  For A2/B GDM or higher classes of DM [x]  Diabetes Education and Testing Supplies [x]  Nutrition Counsult [x]  Fetal ECHO after 20 weeks  [x]  Eye exam for retina evaluation - severe retinopathy 7/19  Base  . Ventricular septal defect (VSD) of fetus in singleton pregnancy, antepartum 09/30/2017   May go to newborn nursery per Dr. Lenard Simmer Echo prior to discharge   Past Surgical History:  Procedure Laterality Date  . CESAREAN SECTION N/A 12/26/2017   Procedure: CESAREAN SECTION;  Surgeon: Osborne Oman, MD;  Location: [place];  Service: Obstetrics;  Laterality: N/A;  . NO PAST SURGERIES    . WISDOM TOOTH EXTRACTION      FAMILY HISTORY Family History  Problem Relation Age of Onset  . Diabetes Mother   . Aneurysm Mother 35  . Diabetes Father   . Cataracts Father   . Healthy Sister   . Healthy Brother   . Healthy Daughter   . Amblyopia Neg Hx   . Blindness Neg Hx   . Glaucoma Neg Hx   . Macular degeneration Neg Hx   . Retinal detachment Neg Hx   . Strabismus Neg Hx   . Retinitis pigmentosa Neg Hx     SOCIAL HISTORY Social History   Tobacco Use  . Smoking status: Never Smoker  . Smokeless tobacco: Never Used  Substance Use Topics  . Alcohol use: Not Currently    Comment: SOCIALLY  . Drug use: Never         OPHTHALMIC EXAM:  Base Eye Exam    Visual Acuity (Snellen - Linear)      Right Left   Dist cc 20/30 -1 CF @ face   Dist ph cc NI NI       Tonometry (Tonopen, 8:56 AM)      Right Left   Pressure 21 19       Pupils      Dark Light Shape React APD   Right 5 4 Round Minimal 0   Left Dilated           Extraocular Movement      Right Left    Full Full       Neuro/Psych    Oriented x3: Yes   Mood/Affect: Normal       Dilation    Both eyes: 1.0% Mydriacyl, 2.5% Phenylephrine @ 8:56 AM        Slit Lamp and Fundus Exam    Slit Lamp Exam      Right Left   Lids/Lashes Normal Mild periorbital edema    Conjunctiva/Sclera White and quiet mild chemosis, sutures intact   Cornea 1+ Punctate epithelial erosions Clear   Anterior Chamber Deep and quiet Deep and quiet   Iris Round and dilated, No NVI Round and moderately dilated to 66mmsaz, No NVI   Lens trace Nuclear sclerosis and cortical changes Trace Nuclear sclerosis and cortical changes, mild PSC, 2-3 + PC feathering  Vitreous trace pigment in anterior vitreous, mild residual VH settled inferiorly post vitrectomy, good oil fill       Fundus Exam      Right Left   Disc Pink and Sharp; +fibrosis and NVD - regressing Pink and sharp   C/D Ratio 0.5 0.2   Macula Flat, Blunted foveal reflex, subhyaloid heme along IT arcades flat under oil   Vessels Vascular attenuation, Tortuous, +NVE superior to disc and all arcades totuous, attenuated   Periphery Attached, scattered DBH, nasal subhyaloid heme just off disc, good 360 PRP attached        Refraction    Wearing Rx      Sphere Cylinder Axis   Right -1.75 +0.25 012   Left -1.75 +0.50 002          IMAGING AND PROCEDURES  Imaging and Procedures for @TODAY @           ASSESSMENT/PLAN:    ICD-10-CM   1. Type 1 diabetes mellitus with proliferative retinopathy of both eyes and macular edema ([place])  E10.3513   2. Traction detachment of left retina  H33.42   3. Vitreous hemorrhage of left eye (HCC)  H43.12   4. Retinal edema  H35.81   5. Essential hypertension  I10   6. Hypertensive retinopathy of both eyes  H35.033   7. Nuclear sclerosis of both eyes  H25.13     1-4. 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 (OS > OD)  - s/p PRP OS (11.26.19)  - s/p PRP OD (06.01.20)  - lost to f/u following 11.26.19 appt due to complicated pregnancy, premature birth of baby w/ extended NICU stay and congenital heart defect, and post-partum health issues  - history of vision loss OS since December 2019  - OS with chronic VH and TRD, likely present since Dec 2019  - discussed findings,  severity of disease, importance of adherence to f/u, and guarded prognosis  - s/p IVA OS #1 (06.23.20)  - Now POD1 s/p PPVMP/EL/silicon oil + IVA OS for VH and TRD from DM1, 07.02.20             - doing ok this morning -- pain and nausea overnight             - retina attached under silicon oil -- improved VH and fibrosis             - IOP mildly elevated (21)             - start   PF 6x/day OS                          zymaxid QID OS                          Atropine BID OS    Brimonidine BID OS                         PSO ung QID OS              - cont face down positioning x3 days then can decrease positioning to 50% of time; avoid laying flat on back              - eye shield when sleeping              - post op drop and positioning instructions reviewed              -  tylenol/ibuprofen for pain  - s/p fill in PRP and IVA OD 07.02.2020 in operating room  - start PF QID OD x7d  - f/u 1 week   5,6. Hypertensive retinopathy OU  - there was concern for pregnancy related hypertension (I.e. Pre-eclampsia, Eclampsia, HELLP, etc) during pregnancy  - also during pregnancy (7.17.2019), had bilateral CRVO w/ CME (OD > OS) s/p PRP OS  - discussed importance of tight BP control  - monitor  7. Nuclear sclerosis OU  - The symptoms of cataract, surgical options, and treatments and risks were discussed with patient.  - discussed diagnosis and likely progression of cataract OS following PPV w/ silicon oil  - monitor for now    Ophthalmic Meds Ordered this visit:  No orders of the defined types were placed in this encounter.     Return 1 week, for DFE, OCT.  There are no Patient Instructions on file for this visit.   Explained the diagnoses, plan, and follow up with the patient and they expressed understanding.  Patient expressed understanding of the importance of proper follow up care.  This document serves as a record of services personally performed by Gardiner Sleeper, MD, PhD. It was  created on their behalf by Ernest Mallick, OA, an ophthalmic assistant. The creation of this record is the provider's dictation and/or activities during the visit.    Electronically signed by: Ernest Mallick, OA  07.02.2020 1:26 PM      Gardiner Sleeper, M.D., Ph.D. Diseases & Surgery of the Retina and Vitreous [facility]  I have reviewed the above documentation for accuracy and completeness, and I agree with the above. Gardiner Sleeper, M.D., Ph.D. 07/21/18 1:26 PM     Abbreviations: M myopia (nearsighted); A astigmatism; H hyperopia (farsighted); P presbyopia; Mrx spectacle prescription;  CTL contact lenses; OD right eye; OS left eye; OU both eyes  XT exotropia; ET esotropia; PEK punctate epithelial keratitis; PEE punctate epithelial erosions; DES dry eye syndrome; MGD meibomian gland dysfunction; ATs artificial tears; PFAT's preservative free artificial tears; Victor nuclear sclerotic cataract; PSC posterior subcapsular cataract; ERM epi-retinal membrane; PVD posterior vitreous detachment; RD retinal detachment; DM diabetes mellitus; DR diabetic retinopathy; NPDR non-proliferative diabetic retinopathy; PDR proliferative diabetic retinopathy; CSME clinically significant macular edema; DME diabetic macular edema; dbh dot blot hemorrhages; CWS cotton wool spot; POAG primary open angle glaucoma; C/D cup-to-disc ratio; HVF humphrey visual field; GVF goldmann visual field; OCT optical coherence tomography; IOP intraocular pressure; BRVO Branch retinal vein occlusion; CRVO central retinal vein occlusion; CRAO central retinal artery occlusion; BRAO branch retinal artery occlusion; RT retinal tear; SB scleral buckle; PPV pars plana vitrectomy; VH Vitreous hemorrhage; PRP panretinal laser photocoagulation; IVK intravitreal kenalog; VMT vitreomacular traction; MH Macular hole;  NVD neovascularization of the disc; NVE neovascularization elsewhere; AREDS age related eye disease study; ARMD age  related macular degeneration; POAG primary open angle glaucoma; EBMD epithelial/anterior basement membrane dystrophy; ACIOL anterior chamber intraocular lens; IOL intraocular lens; PCIOL posterior chamber intraocular lens; Phaco/IOL phacoemulsification with intraocular lens placement; [place] photorefractive keratectomy; LASIK laser assisted in situ keratomileusis; HTN hypertension; DM diabetes mellitus; COPD chronic obstructive pulmonary disease

## 2018-07-21 ENCOUNTER — Ambulatory Visit (INDEPENDENT_AMBULATORY_CARE_PROVIDER_SITE_OTHER): Payer: BC Managed Care – PPO | Admitting: Ophthalmology

## 2018-07-21 ENCOUNTER — Encounter (INDEPENDENT_AMBULATORY_CARE_PROVIDER_SITE_OTHER): Payer: Self-pay | Admitting: Ophthalmology

## 2018-07-21 DIAGNOSIS — I10 Essential (primary) hypertension: Secondary | ICD-10-CM

## 2018-07-21 DIAGNOSIS — E103513 Type 1 diabetes mellitus with proliferative diabetic retinopathy with macular edema, bilateral: Secondary | ICD-10-CM

## 2018-07-21 DIAGNOSIS — H2513 Age-related nuclear cataract, bilateral: Secondary | ICD-10-CM

## 2018-07-21 DIAGNOSIS — H3581 Retinal edema: Secondary | ICD-10-CM

## 2018-07-21 DIAGNOSIS — H35033 Hypertensive retinopathy, bilateral: Secondary | ICD-10-CM

## 2018-07-21 DIAGNOSIS — H3342 Traction detachment of retina, left eye: Secondary | ICD-10-CM

## 2018-07-21 DIAGNOSIS — H4312 Vitreous hemorrhage, left eye: Secondary | ICD-10-CM

## 2018-07-23 ENCOUNTER — Encounter (HOSPITAL_COMMUNITY): Payer: Self-pay | Admitting: Ophthalmology

## 2018-07-24 NOTE — Anesthesia Postprocedure Evaluation (Signed)
Anesthesia Post Note  Patient: Barbara Donovan  Procedure(s) Performed: 25 GAUGE PARS PLANA VITRECTOMY LEFT EYE (Left Eye) Laser Photo Ablation RIGHT EYE (Right Eye) Photocoagulation With Laser LEFT EYE (Left Eye) Injection Of Silicone Oil LEFT EYE (Left Eye) Avastin Application (Bilateral Eye) Membrane Peel LEFT EYE (Left Eye) Eye Exam Under Anesthesia RIGHT EYE (Right Eye)     Patient location during evaluation: PACU Anesthesia Type: General Level of consciousness: awake and alert Pain management: pain level controlled Vital Signs Assessment: post-procedure vital signs reviewed and stable Respiratory status: spontaneous breathing, nonlabored ventilation and respiratory function stable Cardiovascular status: blood pressure returned to baseline and stable Postop Assessment: no apparent nausea or vomiting Anesthetic complications: no    Last Vitals:  Vitals:   07/20/18 1707 07/20/18 1722  BP: 140/83 138/78  Pulse: 98 96  Resp:    Temp:  37.1 C  SpO2: 97% 100%    Last Pain:  Vitals:   07/20/18 1620  TempSrc:   PainSc: 2                  Audry Pili

## 2018-07-26 ENCOUNTER — Telehealth: Payer: Self-pay | Admitting: Endocrinology

## 2018-07-26 ENCOUNTER — Encounter: Payer: Self-pay | Admitting: Endocrinology

## 2018-07-26 ENCOUNTER — Telehealth (INDEPENDENT_AMBULATORY_CARE_PROVIDER_SITE_OTHER): Payer: Self-pay

## 2018-07-26 ENCOUNTER — Other Ambulatory Visit: Payer: Self-pay

## 2018-07-26 NOTE — Telephone Encounter (Signed)
Please advise f/u this week

## 2018-07-26 NOTE — Telephone Encounter (Signed)
Please advise 

## 2018-07-26 NOTE — Telephone Encounter (Signed)
Patient called and advised that she had a Vitrectomy last week and that since her surgery her blood sugars have been high. Has tried to fast to see if that would lower her readings.   Needs to know how to proceed. Please advise

## 2018-07-26 NOTE — Telephone Encounter (Signed)
At Dr. Cordelia Pen request and according to pt availability, pt scheduled for an appt 07/28/18

## 2018-07-26 NOTE — Telephone Encounter (Signed)
insulin aspart (NOVOLOG) 100 UNIT/ML injection 20 mL 11 05/24/2018    Sig: For use in pump, total of 60 units per day.    Please advise

## 2018-07-28 ENCOUNTER — Encounter (INDEPENDENT_AMBULATORY_CARE_PROVIDER_SITE_OTHER): Payer: Self-pay | Admitting: Ophthalmology

## 2018-07-28 ENCOUNTER — Encounter: Payer: Self-pay | Admitting: Endocrinology

## 2018-07-28 ENCOUNTER — Ambulatory Visit: Payer: BC Managed Care – PPO | Admitting: Endocrinology

## 2018-07-28 ENCOUNTER — Other Ambulatory Visit: Payer: Self-pay

## 2018-07-28 ENCOUNTER — Ambulatory Visit (INDEPENDENT_AMBULATORY_CARE_PROVIDER_SITE_OTHER): Payer: BC Managed Care – PPO | Admitting: Ophthalmology

## 2018-07-28 ENCOUNTER — Ambulatory Visit (INDEPENDENT_AMBULATORY_CARE_PROVIDER_SITE_OTHER): Payer: BC Managed Care – PPO | Admitting: Endocrinology

## 2018-07-28 VITALS — BP 144/84 | HR 83 | Ht 66.0 in | Wt 162.0 lb

## 2018-07-28 DIAGNOSIS — H4312 Vitreous hemorrhage, left eye: Secondary | ICD-10-CM

## 2018-07-28 DIAGNOSIS — H2513 Age-related nuclear cataract, bilateral: Secondary | ICD-10-CM

## 2018-07-28 DIAGNOSIS — I10 Essential (primary) hypertension: Secondary | ICD-10-CM

## 2018-07-28 DIAGNOSIS — E109 Type 1 diabetes mellitus without complications: Secondary | ICD-10-CM

## 2018-07-28 DIAGNOSIS — H35033 Hypertensive retinopathy, bilateral: Secondary | ICD-10-CM

## 2018-07-28 DIAGNOSIS — H3342 Traction detachment of retina, left eye: Secondary | ICD-10-CM

## 2018-07-28 DIAGNOSIS — H3581 Retinal edema: Secondary | ICD-10-CM

## 2018-07-28 DIAGNOSIS — E103513 Type 1 diabetes mellitus with proliferative diabetic retinopathy with macular edema, bilateral: Secondary | ICD-10-CM

## 2018-07-28 LAB — POCT GLYCOSYLATED HEMOGLOBIN (HGB A1C): Hemoglobin A1C: 9.7 % — AB (ref 4.0–5.6)

## 2018-07-28 MED ORDER — PREDNISOLONE ACETATE 1 % OP SUSP: 1.0000 [drp] | Freq: Four times a day (QID) | OPHTHALMIC | 1 refills | Status: DC

## 2018-07-28 MED ORDER — INSULIN ASPART 100 UNIT/ML ~~LOC~~ SOLN: SUBCUTANEOUS | 11 refills | Status: DC

## 2018-07-28 NOTE — Patient Instructions (Addendum)
Your blood pressure is high today.  Please see a primary care provider soon, to have it rechecked please take these pump settings: basal rate of 2.3 unit/hr, 7 AM-9 PM, and 0.7 units/hr overnight.   No meal bolus.  correction bolus (which some people call "sensitivity," or "insulin sensitivity ratio," or just "isr") of 1 unit for each 30 by which your glucose exceeds 100.   On this type of pump schedule, you should eat meals on a regular schedule.  If a meal is missed or significantly delayed, your blood sugar could go low.  check your blood sugar 5 times a day: before the 3 meals, and at bedtime.  also check if you have symptoms of your blood sugar being too high or too low.  please keep a record of the readings and bring it to your next appointment here (or you can bring the meter itself).  You can write it on any piece of paper.  please call us sooner if your blood sugar goes below 70, or if you have a lot of readings over 200.  Please come back for a follow-up appointment in 2-3 weeks.

## 2018-07-28 NOTE — Progress Notes (Signed)
Subjective:    Patient ID: Barbara Donovan, female    DOB: 10-23-1990, 28 y.o.   MRN: [id number]  HPI Pt returns for f/u of diabetes mellitus: DM type: 1 Dx'ed: 2751 Complications: PDR Therapy: insulin since 2009, and pump rx since 2020.  DKA: last episode was 2015.  Severe hypoglycemia: never Pancreatitis: never Pancreatic imaging: never.  Other: she takes multiple daily injections; she took pump rx during 2019 pregnancy, but not continuous glucose monitor, due to cost.  Interval history:  She takes these settings:  basal rate of 0.7 units/hr.   bolus of 1 unit/10 grams carbohydrate.   correction bolus (which some people call "sensitivity," or "insulin sensitivity ratio," or just "isr") of 1 unit for each 30 by which your glucose exceeds 100.   TDD is 42 units (39% basal, 9% meal bolus, and 52% correction bolus).  Meter is downloaded today, and the printout is scanned into the record.  cbg varies from 240-400.  There is no trend throughout the day.  Pt says she is afraid to eat, because she fears hyperglycemia.  She had vitrectomy last week.   Past Medical History:  Diagnosis Date  . Asthma 10/30/2013  . Chronic hypertension during pregnancy, antepartum 08/19/2017  . Dehydration 01/28/2018  . Depression during pregnancy, antepartum 07/07/2017   6/20: Short trial of zoloft previously, reports didn't help much but also didn't give it a chance Discussed r/b/a SSRIs in pregnancy, agrees to try Zoloft again, rx sent No SI/HI/red flags  . Diabetes ([place])    TYPE I  . Diabetic retinopathy ([place]) 06/09/2017   07/2017 with bilateral severe diabetic non-proliferative retinopathy with macular edema.  Marland Kitchen HTN (hypertension)   . Hypokalemia 01/22/2018  . Hypomagnesemia 01/28/2018  . Intractable nausea and vomiting 01/22/2018  . Intrauterine growth restriction (IUGR) affecting care of mother 12/22/2017  . Morbid obesity ([place])   . 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 Proteinuria due to type 1  diabetes mellitus ([place]) 11/02/2017   Baseline Pr: Cr 10.23  . Severe hyperemesis gravidarum 10/30/2017  . Type I diabetes mellitus ([place]) 07/07/2017   Current Diabetic Medications:  Insulin  [x]  Aspirin 81 mg daily after 12 weeks (? A2/B GDM)  Required Referrals for A1GDM or A2GDM: [x]  Diabetes Education and Testing Supplies [x]  Nutrition Cousult  For A2/B GDM or higher classes of DM [x]  Diabetes Education and Testing Supplies [x]  Nutrition Counsult [x]  Fetal ECHO after 20 weeks  [x]  Eye exam for retina evaluation - severe retinopathy 7/19  Base  . Ventricular septal defect (VSD) of fetus in singleton pregnancy, antepartum 09/30/2017   May go to newborn nursery per Dr. Lenard Simmer Echo prior to discharge    Past Surgical History:  Procedure Laterality Date  . [place] VITRECTOMY WITH 20 GAUGE MVR PORT FOR MACULAR HOLE Left 07/20/2018   Procedure: 25 GAUGE PARS PLANA VITRECTOMY LEFT EYE;  Surgeon: Bernarda Caffey, MD;  Location: [place];  Service: Ophthalmology;  Laterality: Left;  . CESAREAN SECTION N/A 12/26/2017   Procedure: CESAREAN SECTION;  Surgeon: Osborne Oman, MD;  Location: [place];  Service: Obstetrics;  Laterality: N/A;  . EYE EXAMINATION UNDER ANESTHESIA Right 07/20/2018   Procedure: Eye Exam Under Anesthesia RIGHT EYE;  Surgeon: Bernarda Caffey, MD;  Location: [place];  Service: Ophthalmology;  Laterality: Right;  . INJECTION OF SILICONE OIL Left 7/0/0174   Procedure: Injection Of Silicone Oil LEFT EYE;  Surgeon: Bernarda Caffey, MD;  Location: [place];  Service: Ophthalmology;  Laterality: Left;  . LASER PHOTO ABLATION Right 07/20/2018   Procedure: Laser Photo Ablation RIGHT EYE;  Surgeon: Bernarda Caffey, MD;  Location: [place];  Service: Ophthalmology;  Laterality: Right;  . MEMBRANE PEEL Left 07/20/2018   Procedure: Membrane Peel LEFT EYE;  Surgeon: Bernarda Caffey, MD;  Location: [place];  Service: Ophthalmology;  Laterality: Left;  . MITOMYCIN C APPLICATION Bilateral 06/20/9526    Procedure: Avastin Application;  Surgeon: Bernarda Caffey, MD;  Location: [place];  Service: Ophthalmology;  Laterality: Bilateral;  . NO PAST SURGERIES    . PHOTOCOAGULATION WITH LASER Left 07/20/2018   Procedure: Photocoagulation With Laser LEFT EYE;  Surgeon: Bernarda Caffey, MD;  Location: [place];  Service: Ophthalmology;  Laterality: Left;  . WISDOM TOOTH EXTRACTION      Social History   Socioeconomic History  . Marital status: Single    Spouse name: Not on file  . Number of children: Not on file  . Years of education: Not on file  . Highest education level: Not on file  Occupational History  . Not on file  Social Needs  . Financial resource strain: Not on file  . Food insecurity    Worry: Not on file    Inability: Not on file  . Transportation needs    Medical: Not on file    Non-medical: Not on file  Tobacco Use  . Smoking status: Never Smoker  . Smokeless tobacco: Never Used  Substance and Sexual Activity  . Alcohol use: Not Currently    Comment: SOCIALLY  . Drug use: Never  . Sexual activity: Not Currently    Birth control/protection: None  Lifestyle  . Physical activity    Days per week: Not on file    Minutes per session: Not on file  . Stress: Not on file  Relationships  . Social Herbalist on phone: Not on file    Gets together: Not on file    Attends religious service: Not on file    Active member of club or organization: Not on file    Attends meetings of clubs or organizations: Not on file    Relationship status: Not on file  . Intimate partner violence    Fear of current or ex partner: Not on file    Emotionally abused: Not on file    Physically abused: Not on file    Forced sexual activity: Not on file  Other Topics Concern  . Not on file  Social History Narrative  . Not on file    Current Outpatient Medications on File Prior to Visit  Medication Sig Dispense Refill  . amLODipine (NORVASC) 5 MG tablet Take 1 tablet (5 mg total) by mouth  daily. For Blood Pressure 30 tablet 1  . carvedilol (COREG) 6.25 MG tablet Take 1 tablet (6.25 mg total) by mouth 2 (two) times daily. 180 tablet 3  . ferrous sulfate 325 (65 FE) MG tablet Take 1 tablet (325 mg total) by mouth 2 (two) times daily with a meal. 60 tablet 3  . Insulin Pen Needle (PEN NEEDLES) 32G X 5 MM MISC 1 Device by Does not apply route 4 (four) times daily. 120 each 11  . ONETOUCH VERIO test strip USED TO CHECK BLOOD SUGARS FIVE TIMES DAILY. 100 each 12  . prednisoLONE acetate (PRED FORTE) 1 % ophthalmic suspension Place 1 drop into the left eye 4 (four) times daily. 10 mL 1  . spironolactone (ALDACTONE) 25 MG tablet Take 1 tablet (  25 mg total) by mouth daily. 90 tablet 3   No current facility-administered medications on file prior to visit.     No Known Allergies  Family History  Problem Relation Age of Onset  . Diabetes Mother   . Aneurysm Mother 24  . Diabetes Father   . Cataracts Father   . Healthy Sister   . Healthy Brother   . Healthy Daughter   . Amblyopia Neg Hx   . Blindness Neg Hx   . Glaucoma Neg Hx   . Macular degeneration Neg Hx   . Retinal detachment Neg Hx   . Strabismus Neg Hx   . Retinitis pigmentosa Neg Hx     BP (!) 144/84 (BP Location: Left Arm, Patient Position: Sitting, Cuff Size: Normal)   Pulse 83   Ht 5\' 6"  (1.676 m)   Wt 162 lb (73.5 kg)   SpO2 97%   BMI 26.15 kg/m   Review of Systems She has lost 6 lbs, since last ov.  She denies hypoglycemia.      Objective:   Physical Exam VITAL SIGNS:  See vs page GENERAL: no distress Pulses: dorsalis pedis intact bilat.   MSK: no deformity of the feet CV: no leg edema Skin:  no ulcer on the feet.  normal color and temp on the feet. Neuro: sensation is intact to touch on the feet.    Lab Results  Component Value Date   HGBA1C 9.7 (A) 07/28/2018       Assessment & Plan:  Type 1 DM, with DR: she needs increased rx.  We discussed options: she wants to use a higher daytime basal  rather than using meal boluses.   HTN: is noted today.     Patient Instructions  Your blood pressure is high today.  Please see a primary care provider soon, to have it rechecked please take these pump settings: basal rate of 2.3 unit/hr, 7 AM-9 PM, and 0.7 units/hr overnight.   No meal bolus.  correction bolus (which some people call "sensitivity," or "insulin sensitivity ratio," or just "isr") of 1 unit for each 30 by which your glucose exceeds 100.   On this type of pump schedule, you should eat meals on a regular schedule.  If a meal is missed or significantly delayed, your blood sugar could go low.  check your blood sugar 5 times a day: before the 3 meals, and at bedtime.  also check if you have symptoms of your blood sugar being too high or too low.  please keep a record of the readings and bring it to your next appointment here (or you can bring the meter itself).  You can write it on any piece of paper.  please call us sooner if your blood sugar goes below 70, or if you have a lot of readings over 200.  Please come back for a follow-up appointment in 2-3 weeks.

## 2018-07-28 NOTE — Progress Notes (Signed)
[facility] Note  07/28/2018     CHIEF COMPLAINT Patient presents for Post-op Follow-up   HISTORY OF PRESENT ILLNESS: Barbara Donovan is a 28 y.o. female who presents to the clinic today for:   HPI    Post-op Follow-up    In left eye.  Vision is improved.  I, the attending physician,  performed the HPI with the patient and updated documentation appropriately.          Comments    BS: 333 today at approx 12PM (Has appt with Endocrinologist this afternoon) Last HgA1c: 12.0 Patient states her vision is getting better and does not have anymore pain OS.  Patient denies any new or worsening floaters or fol.       Last edited by Bernarda Caffey, MD on 07/28/2018  3:26 PM. (History)      Referring physician: No referring provider defined for this encounter.  HISTORICAL INFORMATION:   Selected notes from the MEDICAL RECORD NUMBER Referred by Dr. [name] for concern of bilateral CRVO LEE-  Ocular Hx-  PMH-     CURRENT MEDICATIONS: Current Outpatient Medications (Ophthalmic Drugs)  Medication Sig  . prednisoLONE acetate (PRED FORTE) 1 % ophthalmic suspension Place 1 drop into the left eye 4 (four) times daily.   No current facility-administered medications for this visit.  (Ophthalmic Drugs)   Current Outpatient Medications (Other)  Medication Sig  . amLODipine (NORVASC) 5 MG tablet Take 1 tablet (5 mg total) by mouth daily. For Blood Pressure  . carvedilol (COREG) 6.25 MG tablet Take 1 tablet (6.25 mg total) by mouth 2 (two) times daily.  . ferrous sulfate 325 (65 FE) MG tablet Take 1 tablet (325 mg total) by mouth 2 (two) times daily with a meal.  . insulin aspart (NOVOLOG) 100 UNIT/ML injection For use in pump, total of 60 units per day. (Patient taking differently: Inject 60 Units into the skin daily. )  . insulin glargine (LANTUS) 100 unit/mL SOPN Inject 30 Units into the skin at bedtime.  . Insulin Pen Needle (PEN NEEDLES) 32G X 5 MM MISC  1 Device by Does not apply route 4 (four) times daily.  [name] VERIO test strip USED TO CHECK BLOOD SUGARS FIVE TIMES DAILY.  Marland Kitchen spironolactone (ALDACTONE) 25 MG tablet Take 1 tablet (25 mg total) by mouth daily.   No current facility-administered medications for this visit.  (Other)      REVIEW OF SYSTEMS: ROS    Positive for: Endocrine, Cardiovascular, Eyes   Negative for: Constitutional, Gastrointestinal, Neurological, Skin, Genitourinary, Musculoskeletal, HENT, Respiratory, Psychiatric, Allergic/Imm, Heme/Lymph   Last edited by Doneen Poisson on 07/28/2018  2:32 PM. (History)       ALLERGIES No Known Allergies  PAST MEDICAL HISTORY Past Medical History:  Diagnosis Date  . Asthma 10/30/2013  . Chronic hypertension during pregnancy, antepartum 08/19/2017  . Dehydration 01/28/2018  . Depression during pregnancy, antepartum 07/07/2017   6/20: Short trial of zoloft previously, reports didn't help much but also didn't give it a chance Discussed r/b/a SSRIs in pregnancy, agrees to try Zoloft again, rx sent No SI/HI/red flags  . Diabetes ([place])    TYPE I  . Diabetic retinopathy ([place]) 06/09/2017   07/2017 with bilateral severe diabetic non-proliferative retinopathy with macular edema.  Marland Kitchen HTN (hypertension)   . Hypokalemia 01/22/2018  . Hypomagnesemia 01/28/2018  . Intractable nausea and vomiting 01/22/2018  . Intrauterine growth restriction (IUGR) affecting care of mother 12/22/2017  .  Morbid obesity ([place])   . 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 Proteinuria due to type 1 diabetes mellitus ([place]) 11/02/2017   Baseline Pr: Cr 10.23  . Severe hyperemesis gravidarum 10/30/2017  . Type I diabetes mellitus ([place]) 07/07/2017   Current Diabetic Medications:  Insulin  [x]  Aspirin 81 mg daily after 12 weeks (? A2/B GDM)  Required Referrals for A1GDM or A2GDM: [x]  Diabetes Education and Testing Supplies [x]  Nutrition Cousult  For A2/B GDM or higher classes of DM [x]  Diabetes Education and  Testing Supplies [x]  Nutrition Counsult [x]  Fetal ECHO after 20 weeks  [x]  Eye exam for retina evaluation - severe retinopathy 7/19  Base  . Ventricular septal defect (VSD) of fetus in singleton pregnancy, antepartum 09/30/2017   May go to newborn nursery per Dr. Lenard Simmer Echo prior to discharge   Past Surgical History:  Procedure Laterality Date  . [place] VITRECTOMY WITH 20 GAUGE MVR PORT FOR MACULAR HOLE Left 07/20/2018   Procedure: 25 GAUGE PARS PLANA VITRECTOMY LEFT EYE;  Surgeon: Bernarda Caffey, MD;  Location: [facility];  Service: Ophthalmology;  Laterality: Left;  . CESAREAN SECTION N/A 12/26/2017   Procedure: CESAREAN SECTION;  Surgeon: Osborne Oman, MD;  Location: [facility];  Service: Obstetrics;  Laterality: N/A;  . EYE EXAMINATION UNDER ANESTHESIA Right 07/20/2018   Procedure: Eye Exam Under Anesthesia RIGHT EYE;  Surgeon: Bernarda Caffey, MD;  Location: [facility];  Service: Ophthalmology;  Laterality: Right;  . INJECTION OF SILICONE OIL Left 09/23/930   Procedure: Injection Of Silicone Oil LEFT EYE;  Surgeon: Bernarda Caffey, MD;  Location: [facility];  Service: Ophthalmology;  Laterality: Left;  . LASER PHOTO ABLATION Right 07/20/2018   Procedure: Laser Photo Ablation RIGHT EYE;  Surgeon: Bernarda Caffey, MD;  Location: [facility];  Service: Ophthalmology;  Laterality: Right;  . MEMBRANE PEEL Left 07/20/2018   Procedure: Membrane Peel LEFT EYE;  Surgeon: Bernarda Caffey, MD;  Location: [facility];  Service: Ophthalmology;  Laterality: Left;  . MITOMYCIN C APPLICATION Bilateral 06/25/1243   Procedure: Avastin Application;  Surgeon: Bernarda Caffey, MD;  Location: [facility];  Service: Ophthalmology;  Laterality: Bilateral;  . NO PAST SURGERIES    . PHOTOCOAGULATION WITH LASER Left 07/20/2018   Procedure: Photocoagulation With Laser LEFT EYE;  Surgeon: Bernarda Caffey, MD;  Location: [facility];  Service: Ophthalmology;  Laterality: Left;  . WISDOM TOOTH EXTRACTION      FAMILY HISTORY Family History  Problem  Relation Age of Onset  . Diabetes Mother   . Aneurysm Mother 37  . Diabetes Father   . Cataracts Father   . Healthy Sister   . Healthy Brother   . Healthy Daughter   . Amblyopia Neg Hx   . Blindness Neg Hx   . Glaucoma Neg Hx   . Macular degeneration Neg Hx   . Retinal detachment Neg Hx   . Strabismus Neg Hx   . Retinitis pigmentosa Neg Hx     SOCIAL HISTORY Social History   Tobacco Use  . Smoking status: Never Smoker  . Smokeless tobacco: Never Used  Substance Use Topics  . Alcohol use: Not Currently    Comment: SOCIALLY  . Drug use: Never         OPHTHALMIC EXAM:  Base Eye Exam    Visual Acuity (Snellen - Linear)      Right Left   Dist cc 20/40 +1 CF @ 4'   Dist ph cc 20/30 -2 20/200 +1  Tonometry (Tonopen, 2:36 PM)      Right Left   Pressure 16 15       Pupils      Dark Light Shape React APD   Right 6 5 Round Brisk 0   Left Dilated           Extraocular Movement      Right Left    Full Full       Neuro/Psych    Oriented x3: Yes   Mood/Affect: Normal       Dilation    Left eye: 1.0% Mydriacyl, 2.5% Phenylephrine @ 2:36 PM        Slit Lamp and Fundus Exam    Slit Lamp Exam      Right Left   Lids/Lashes Normal Mild periorbital edema   Conjunctiva/Sclera White and quiet white and quiet, sutures intact   Cornea 1+ Punctate epithelial erosions mild endo pigment inferiorly   Anterior Chamber Deep and quiet Deep and quiet   Iris Round and dilated, No NVI Round and dilated to 83mmsaz, No NVI   Lens trace Nuclear sclerosis and cortical changes 1+Nuclear sclerosis, 1+cortical changes, 1+PSC   Vitreous trace pigment in anterior vitreous, mild residual VH settled inferiorly post vitrectomy, good oil fill       Fundus Exam      Right Left   Disc Pink and Sharp; +fibrosis and NVD - regressing Pink and sharp, improved periorbital edema   C/D Ratio 0.5 0.2   Macula Flat, Blunted foveal reflex, subhyaloid heme along IT arcades flat under oil    Vessels Vascular attenuation, Tortuous, +NVE superior to disc and all arcades totuous, attenuated   Periphery Attached, scattered DBH, nasal subhyaloid heme just off disc, good 360 PRP attached        Refraction    Wearing Rx      Sphere Cylinder Axis   Right -1.75 +0.25 012   Left -1.75 +0.50 002          IMAGING AND PROCEDURES  Imaging and Procedures for @TODAY @  POCT HgB A1C     Component Value Flag Ref Range Units Status   Hemoglobin A1C 9.7      4.0 - 5.6 % Final   HbA1c POC (<> result, manual entry)            HbA1c, POC (prediabetic range)            HbA1c, POC (controlled diabetic range)                 OCT, Retina - OU - Both Eyes       Right Eye Quality was good. Central Foveal Thickness: 223. Progression has been stable. Findings include no SRF, intraretinal hyper-reflective material, no IRF, normal foveal contour, vitreous traction, macular pucker, epiretinal membrane, preretinal fibrosis (Interval improvement sub hyaloid heme along inferior arcades).   Left Eye Quality was poor. Central Foveal Thickness: 385. Progression has improved. Findings include abnormal foveal contour, preretinal fibrosis, epiretinal membrane, no IRF, subretinal fluid, intraretinal hyper-reflective material (Peripapillary retinal thickening and shallow pockets of SRF; macular atrophy).   Notes *Images captured and stored on drive  Diagnosis / Impression:  PDR OU OD: mild preretinal fibrosis/ERM/pucker but Interval improvement in vitreous opacities and subhyaloid heme OS: retina attached under oil; Peripapillary retinal thickening and shallow pockets of SRF; macular atrophy  Clinical management:  See below  Abbreviations: NFP - Normal foveal profile. CME - cystoid macular edema. PED - pigment epithelial detachment. IRF -  intraretinal fluid. SRF - subretinal fluid. EZ - ellipsoid zone. ERM - epiretinal membrane. ORA - outer retinal atrophy. ORT - outer retinal tubulation. SRHM -  subretinal hyper-reflective material                  ASSESSMENT/PLAN:    ICD-10-CM   1. 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 Traction detachment of left retina  H33.42   3. Vitreous hemorrhage of left eye (HCC)  H43.12   4. Retinal edema  H35.81 OCT, Retina - OU - Both Eyes  5. Essential hypertension  I10   6. Hypertensive retinopathy of both eyes  H35.033   7. Nuclear sclerosis of both eyes  H25.13   8. Central retinal vein occlusion with macular edema of both eyes  H34.8130   9. [redacted] weeks gestation of pregnancy  Z3A.33   10. Type 1 diabetes mellitus with moderate nonproliferative retinopathy of both eyes and macular edema (HCC)  E10.3313   11. [redacted] weeks gestation of pregnancy  Z3A.15     1-4. 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 (OS > OD)  - s/p PRP OS (11.26.19)  - s/p PRP OD (06.01.20)  - lost to f/u following 11.26.19 appt due to complicated pregnancy, premature birth of baby w/ extended NICU stay and congenital heart defect, and post-partum health issues  - history of vision loss OS since December 2019  - OS with chronic VH and TRD, likely present since Dec 2019  - s/p IVA OS #1 (06.23.20)  - Now POW1 s/p PPV/MP/EL/silicon oil + IVA OS for VH and TRD from DM1, 07.02.20             - did well this week             - retina attached under silicon oil -- improved VH and fibrosis             - IOP 15             - dec  PF 4x/day OS                          zymaxid QID OS -- stop when bottle runs out                         Atropine BID OS -- stop when bottle runs out   Brimonidine BID OS                         PSO ung QID OS              - cont face down positioning 50% of time; avoid laying flat on back              - eye shield when sleeping              - post op drop and positioning instructions reviewed              - tylenol/ibuprofen for pain  - s/p fill in PRP and IVA OD  07.02.2020 in operating room  - completed PF QID OD x7d  - f/u 3-4 weeks   5,6. Hypertensive retinopathy OU  - there was concern for pregnancy related hypertension (I.e. Pre-eclampsia, Eclampsia, HELLP, etc) during pregnancy  - also during pregnancy (7.17.2019), had  bilateral CRVO w/ CME (OD > OS) s/p PRP OS  - discussed importance of tight BP control  - monitor  7. Nuclear sclerosis OU  - The symptoms of cataract, surgical options, and treatments and risks were discussed with patient.  - discussed diagnosis and likely progression of cataract OS following PPV w/ silicon oil  - monitor for now    Ophthalmic Meds Ordered this visit:  Meds ordered this encounter  Medications  . prednisoLONE acetate (PRED FORTE) 1 % ophthalmic suspension    Sig: Place 1 drop into the left eye 4 (four) times daily.    Dispense:  10 mL    Refill:  1      Return in about 3 weeks (around 08/18/2018) for f/u TRD OS, DFE, OCT.  There are no Patient Instructions on file for this visit.   Explained the diagnoses, plan, and follow up with the patient and they expressed understanding.  Patient expressed understanding of the importance of proper follow up care.  This document serves as a record of services personally performed by Gardiner Sleeper, MD, PhD. It was created on their behalf by Ernest Mallick, OA, an ophthalmic assistant. The creation of this record is the provider's dictation and/or activities during the visit.    Electronically signed by: Ernest Mallick, OA  07.10.2020 4:08 PM    Gardiner Sleeper, M.D., Ph.D. Diseases & Surgery of the Retina and Vitreous [facility]  I have reviewed the above documentation for accuracy and completeness, and I agree with the above. Gardiner Sleeper, M.D., Ph.D. 07/28/18 4:10 PM    Abbreviations: M myopia (nearsighted); A astigmatism; H hyperopia (farsighted); P presbyopia; Mrx spectacle prescription;  CTL contact lenses; OD right eye; OS  left eye; OU both eyes  XT exotropia; ET esotropia; PEK punctate epithelial keratitis; PEE punctate epithelial erosions; DES dry eye syndrome; MGD meibomian gland dysfunction; ATs artificial tears; PFAT's preservative free artificial tears; [place] nuclear sclerotic cataract; PSC posterior subcapsular cataract; ERM epi-retinal membrane; PVD posterior vitreous detachment; RD retinal detachment; DM diabetes mellitus; DR diabetic retinopathy; NPDR non-proliferative diabetic retinopathy; PDR proliferative diabetic retinopathy; CSME clinically significant macular edema; DME diabetic macular edema; dbh dot blot hemorrhages; CWS cotton wool spot; POAG primary open angle glaucoma; C/D cup-to-disc ratio; HVF humphrey visual field; GVF goldmann visual field; OCT optical coherence tomography; IOP intraocular pressure; BRVO Branch retinal vein occlusion; CRVO central retinal vein occlusion; CRAO central retinal artery occlusion; BRAO branch retinal artery occlusion; RT retinal tear; SB scleral buckle; PPV pars plana vitrectomy; VH Vitreous hemorrhage; PRP panretinal laser photocoagulation; IVK intravitreal kenalog; VMT vitreomacular traction; MH Macular hole;  NVD neovascularization of the disc; NVE neovascularization elsewhere; AREDS age related eye disease study; ARMD age related macular degeneration; POAG primary open angle glaucoma; EBMD epithelial/anterior basement membrane dystrophy; ACIOL anterior chamber intraocular lens; IOL intraocular lens; PCIOL posterior chamber intraocular lens; Phaco/IOL phacoemulsification with intraocular lens placement; [place] photorefractive keratectomy; LASIK laser assisted in situ keratomileusis; HTN hypertension; DM diabetes mellitus; COPD chronic obstructive pulmonary disease

## 2018-08-04 ENCOUNTER — Telehealth: Payer: Self-pay | Admitting: Family Medicine

## 2018-08-04 NOTE — Telephone Encounter (Signed)
Attempted to call patient about her appointment on 7/20 @ 9:00. No answer, left voicemail instructing patient to wear a face mask for the entire appointment and no visitors are allowed during the visit. Patient was no screened due to no answer. Patient instructed is she has any symptoms not to attend the appointment and give the office a call back to be rescheduled. Symptom list and office number left.

## 2018-08-07 ENCOUNTER — Other Ambulatory Visit: Payer: Self-pay

## 2018-08-07 ENCOUNTER — Ambulatory Visit (INDEPENDENT_AMBULATORY_CARE_PROVIDER_SITE_OTHER): Payer: BC Managed Care – PPO

## 2018-08-07 DIAGNOSIS — B9689 Other specified bacterial agents as the cause of diseases classified elsewhere: Secondary | ICD-10-CM

## 2018-08-07 DIAGNOSIS — Z113 Encounter for screening for infections with a predominantly sexual mode of transmission: Secondary | ICD-10-CM | POA: Diagnosis not present

## 2018-08-07 DIAGNOSIS — N760 Acute vaginitis: Secondary | ICD-10-CM

## 2018-08-07 DIAGNOSIS — N898 Other specified noninflammatory disorders of vagina: Secondary | ICD-10-CM | POA: Diagnosis not present

## 2018-08-07 LAB — POCT PREGNANCY, URINE: Preg Test, Ur: NEGATIVE

## 2018-08-07 NOTE — Progress Notes (Signed)
Patient seen and assessed by nursing staff during this encounter. I have reviewed the chart and agree with the documentation and plan.  Verita Schneiders, MD 08/07/2018 4:11 PM

## 2018-08-07 NOTE — Progress Notes (Signed)
Pt here today for self swab.  Pt reports having a discharge with sweaty smell.  Pt explained how to obtain self swab and that we will call her in 24-48 hrs with abnormal results.  Pt reports that she is wanting to restart Depo Provera due to her not getting her second dose of Depo after she received before she was discharged in the hospital.  Pt also reports having unprotected sex three days ago.  Per protocol pt administered pregnancy test.  Resulted negative.  Pt informed that she will need to abstain from intercourse or use condoms for the next two weeks, schedule an appt, in which she would then get the Depo Provera.  Pt verbalized understanding and had no further questions.   Mel Almond, RN 08/07/18

## 2018-08-08 ENCOUNTER — Telehealth (INDEPENDENT_AMBULATORY_CARE_PROVIDER_SITE_OTHER): Payer: BC Managed Care – PPO | Admitting: Cardiology

## 2018-08-08 DIAGNOSIS — R0789 Other chest pain: Secondary | ICD-10-CM

## 2018-08-08 DIAGNOSIS — I10 Essential (primary) hypertension: Secondary | ICD-10-CM | POA: Diagnosis not present

## 2018-08-08 DIAGNOSIS — R079 Chest pain, unspecified: Secondary | ICD-10-CM

## 2018-08-08 DIAGNOSIS — R0602 Shortness of breath: Secondary | ICD-10-CM

## 2018-08-08 MED ORDER — OMRON 3 SERIES BP MONITOR DEVI: 1.0000 | Freq: Every day | 0 refills | Status: DC

## 2018-08-08 MED ORDER — CARVEDILOL 12.5 MG PO TABS: 12.5000 mg | ORAL_TABLET | Freq: Two times a day (BID) | ORAL | 3 refills | Status: DC

## 2018-08-08 NOTE — Patient Instructions (Signed)
Medication Instructions:  Your physician has recommended you make the following change in your medication:   Increase your Carvedilol to 12.5mg  tablet, twice daily.   Labwork: None ordered.  Testing/Procedures: Your physician has requested that you regularly monitor and record your blood pressure readings at home. Please use the same machine at the same time of day to check your readings and record them to bring to your follow-up visit.  Please report back to Dr. Radford Pax with your readings in two weeks.   Follow-Up:  You have a follow up virtual visit scheduled with Dr. Radford Pax on October 21 @ 9AM  Any Other Special Instructions Will Be Listed Below (If Applicable).    Low-Sodium Eating Plan Sodium, which is an element that makes up salt, helps you maintain a healthy balance of fluids in your body. Too much sodium can increase your blood pressure and cause fluid and waste to be held in your body. Your health care provider or dietitian may recommend following this plan if you have high blood pressure (hypertension), kidney disease, liver disease, or heart failure. Eating less sodium can help lower your blood pressure, reduce swelling, and protect your heart, liver, and kidneys. What are tips for following this plan? General guidelines  Most people on this plan should limit their sodium intake to 1,500-2,000 mg (milligrams) of sodium each day. Reading food labels   The Nutrition Facts label lists the amount of sodium in one serving of the food. If you eat more than one serving, you must multiply the listed amount of sodium by the number of servings.  Choose foods with less than 140 mg of sodium per serving.  Avoid foods with 300 mg of sodium or more per serving. Shopping  Look for lower-sodium products, often labeled as "low-sodium" or "no salt added."  Always check the sodium content even if foods are labeled as "unsalted" or "no salt added".  Buy fresh foods. ? Avoid canned  foods and premade or frozen meals. ? Avoid canned, cured, or processed meats  Buy breads that have less than 80 mg of sodium per slice. Cooking  Eat more home-cooked food and less restaurant, buffet, and fast food.  Avoid adding salt when cooking. Use salt-free seasonings or herbs instead of table salt or sea salt. Check with your health care provider or pharmacist before using salt substitutes.  Cook with plant-based oils, such as canola, sunflower, or olive oil. Meal planning  When eating at a restaurant, ask that your food be prepared with less salt or no salt, if possible.  Avoid foods that contain MSG (monosodium glutamate). MSG is sometimes added to [place] food, bouillon, and some canned foods. What foods are recommended? The items listed may not be a complete list. Talk with your dietitian about what dietary choices are best for you. Grains Low-sodium cereals, including oats, puffed wheat and rice, and shredded wheat. Low-sodium crackers. Unsalted rice. Unsalted pasta. Low-sodium bread. Whole-grain breads and whole-grain pasta. Vegetables Fresh or frozen vegetables. "No salt added" canned vegetables. "No salt added" tomato sauce and paste. Low-sodium or reduced-sodium tomato and vegetable juice. Fruits Fresh, frozen, or canned fruit. Fruit juice. Meats and other protein foods Fresh or frozen (no salt added) meat, poultry, seafood, and fish. Low-sodium canned tuna and salmon. Unsalted nuts. Dried peas, beans, and lentils without added salt. Unsalted canned beans. Eggs. Unsalted nut butters. Dairy Milk. Soy milk. Cheese that is naturally low in sodium, such as ricotta cheese, fresh mozzarella, or Swiss cheese Low-sodium or  reduced-sodium cheese. Cream cheese. Yogurt. Fats and oils Unsalted butter. Unsalted margarine with no trans fat. Vegetable oils such as canola or olive oils. Seasonings and other foods Fresh and dried herbs and spices. Salt-free seasonings. Low-sodium mustard  and ketchup. Sodium-free salad dressing. Sodium-free light mayonnaise. Fresh or refrigerated horseradish. Lemon juice. Vinegar. Homemade, reduced-sodium, or low-sodium soups. Unsalted popcorn and pretzels. Low-salt or salt-free chips. What foods are not recommended? The items listed may not be a complete list. Talk with your dietitian about what dietary choices are best for you. Grains Instant hot cereals. Bread stuffing, pancake, and biscuit mixes. Croutons. Seasoned rice or pasta mixes. Noodle soup cups. Boxed or frozen macaroni and cheese. Regular salted crackers. Self-rising flour. Vegetables Sauerkraut, pickled vegetables, and relishes. Olives. [place] fries. Onion rings. Regular canned vegetables (not low-sodium or reduced-sodium). Regular canned tomato sauce and paste (not low-sodium or reduced-sodium). Regular tomato and vegetable juice (not low-sodium or reduced-sodium). Frozen vegetables in sauces. Meats and other protein foods Meat or fish that is salted, canned, smoked, spiced, or pickled. Bacon, ham, sausage, hotdogs, corned beef, chipped beef, packaged lunch meats, salt pork, jerky, pickled herring, anchovies, regular canned tuna, sardines, salted nuts. Dairy Processed cheese and cheese spreads. Cheese curds. Blue cheese. Feta cheese. String cheese. Regular cottage cheese. Buttermilk. Canned milk. Fats and oils Salted butter. Regular margarine. Ghee. Bacon fat. Seasonings and other foods Onion salt, garlic salt, seasoned salt, table salt, and sea salt. Canned and packaged gravies. Worcestershire sauce. Tartar sauce. Barbecue sauce. Teriyaki sauce. Soy sauce, including reduced-sodium. Steak sauce. Fish sauce. Oyster sauce. Cocktail sauce. Horseradish that you find on the shelf. Regular ketchup and mustard. Meat flavorings and tenderizers. Bouillon cubes. Hot sauce and Tabasco sauce. Premade or packaged marinades. Premade or packaged taco seasonings. Relishes. Regular salad dressings. Salsa.  Potato and tortilla chips. Corn chips and puffs. Salted popcorn and pretzels. Canned or dried soups. Pizza. Frozen entrees and pot pies. Summary  Eating less sodium can help lower your blood pressure, reduce swelling, and protect your heart, liver, and kidneys.  Most people on this plan should limit their sodium intake to 1,500-2,000 mg (milligrams) of sodium each day.  Canned, boxed, and frozen foods are high in sodium. Restaurant foods, fast foods, and pizza are also very high in sodium. You also get sodium by adding salt to food.  Try to cook at home, eat more fresh fruits and vegetables, and eat less fast food, canned, processed, or prepared foods. This information is not intended to replace advice given to you by your health care provider. Make sure you discuss any questions you have with your health care provider. Document Released: 06/26/2001 Document Revised: 12/17/2016 Document Reviewed: 12/29/2015 Elsevier Patient Education  2020 [employer].   If you need a refill on your cardiac medications before your next appointment, please call your pharmacy.

## 2018-08-08 NOTE — Progress Notes (Signed)
Virtual Visit via Telephone Note   This visit type was conducted due to national recommendations for restrictions regarding the COVID-19 Pandemic (e.g. social distancing) in an effort to limit this patient's exposure and mitigate transmission in our community.  Due to her co-morbid illnesses, this patient is at least at moderate risk for complications without adequate follow up.  This format is felt to be most appropriate for this patient at this time.  All issues noted in this document were discussed and addressed.  A limited physical exam was performed with this format.  Please refer to the patient's chart for her consent to telehealth for [facility].   Evaluation Performed:  Follow-up visit  This visit type was conducted due to national recommendations for restrictions regarding the COVID-19 Pandemic (e.g. social distancing).  This format is felt to be most appropriate for this patient at this time.  All issues noted in this document were discussed and addressed.  No physical exam was performed (except for noted visual exam findings with Video Visits).  Please refer to the patient's chart (MyChart message for video visits and phone note for telephone visits) for the patient's consent to telehealth for [facility].  Date:  08/08/2018   ID:  Barbara Donovan, DOB [birth year], MRN [id number]  Patient Location:  Home  Provider location:   [facility]  PCP:  Patient, No Pcp Per  Cardiologist:  Fransico Him, MD Electrophysiologist:  None   Chief Complaint:  HTN  History of Present Illness:    Barbara Donovan is a 28 y.o. female who presents via audio/video conferencing for a telehealth visit today.   This is a 28yo female with a hx of chronic HTN who I saw 03/28/2018 and adjusted BP meds.  She says that she has been doing well with her BP.  She is here today for followup and is doing well.  She denies any chest pain or pressure, SOB, DOE, PND, orthopnea, LE edema, dizziness,  palpitations or syncope. She is compliant with her meds and is tolerating meds with no SE.    The patient does not have symptoms concerning for COVID-19 infection (fever, chills, cough, or new shortness of breath).    Prior CV studies:   The following studies were reviewed today:  none  Past Medical History:  Diagnosis Date  . Asthma 10/30/2013  . Chronic hypertension during pregnancy, antepartum 08/19/2017  . Dehydration 01/28/2018  . Depression during pregnancy, antepartum 07/07/2017   6/20: Short trial of zoloft previously, reports didn't help much but also didn't give it a chance Discussed r/b/a SSRIs in pregnancy, agrees to try Zoloft again, rx sent No SI/HI/red flags  . Diabetes ([place])    TYPE I  . 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  Marland Kitchen HTN (hypertension)   . Hypokalemia 01/22/2018  . Hypomagnesemia 01/28/2018  . Intractable nausea and vomiting 01/22/2018  . Intrauterine growth restriction (IUGR) affecting care of mother 12/22/2017  . Morbid obesity ([place])   . 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 Severe hyperemesis gravidarum 10/30/2017  . Type I diabetes mellitus ([place]) 07/07/2017   Current Diabetic Medications:  Insulin  [x]  Aspirin 81 mg daily after 12 weeks (? A2/B GDM)  Required Referrals for A1GDM or A2GDM: [x]  Diabetes Education and Testing Supplies [x]  Nutrition Cousult  For A2/B GDM or higher classes of  DM [x]  Diabetes Education and Testing Supplies [x]  Nutrition Counsult [x]  Fetal ECHO after 20 weeks  [x]  Eye exam for retina evaluation - severe retinopathy 7/19  Base  . Ventricular septal defect (VSD) of fetus in singleton pregnancy, antepartum 09/30/2017   May go to newborn nursery per Dr. Lenard Simmer Echo prior to discharge   Past Surgical History:  Procedure Laterality Date  . [place]  VITRECTOMY WITH 20 GAUGE MVR PORT FOR MACULAR HOLE Left 07/20/2018   Procedure: 25 GAUGE PARS PLANA VITRECTOMY LEFT EYE;  Surgeon: Bernarda Caffey, MD;  Location: [place];  Service: Ophthalmology;  Laterality: Left;  . CESAREAN SECTION N/A 12/26/2017   Procedure: CESAREAN SECTION;  Surgeon: Osborne Oman, MD;  Location: [place];  Service: Obstetrics;  Laterality: N/A;  . EYE EXAMINATION UNDER ANESTHESIA Right 07/20/2018   Procedure: Eye Exam Under Anesthesia RIGHT EYE;  Surgeon: Bernarda Caffey, MD;  Location: [place];  Service: Ophthalmology;  Laterality: Right;  . INJECTION OF SILICONE OIL Left 09/26/8336   Procedure: Injection Of Silicone Oil LEFT EYE;  Surgeon: Bernarda Caffey, MD;  Location: [place];  Service: Ophthalmology;  Laterality: Left;  . LASER PHOTO ABLATION Right 07/20/2018   Procedure: Laser Photo Ablation RIGHT EYE;  Surgeon: Bernarda Caffey, MD;  Location: [place];  Service: Ophthalmology;  Laterality: Right;  . MEMBRANE PEEL Left 07/20/2018   Procedure: Membrane Peel LEFT EYE;  Surgeon: Bernarda Caffey, MD;  Location: [place];  Service: Ophthalmology;  Laterality: Left;  . MITOMYCIN C APPLICATION Bilateral 02/22/537   Procedure: Avastin Application;  Surgeon: Bernarda Caffey, MD;  Location: [place];  Service: Ophthalmology;  Laterality: Bilateral;  . NO PAST SURGERIES    . PHOTOCOAGULATION WITH LASER Left 07/20/2018   Procedure: Photocoagulation With Laser LEFT EYE;  Surgeon: Bernarda Caffey, MD;  Location: [place];  Service: Ophthalmology;  Laterality: Left;  . WISDOM TOOTH EXTRACTION       No outpatient medications have been marked as taking for the 08/08/18 encounter (Appointment) with Sueanne Margarita, MD.     Allergies:   Patient has no known allergies.   Social History   Tobacco Use  . Smoking status: Never Smoker  . Smokeless tobacco: Never Used  Substance Use Topics  . Alcohol use: Not Currently    Comment: SOCIALLY  . Drug use: Never     Family Hx: The patient's family history  includes Aneurysm (age of onset: 53) in her mother; Cataracts in her father; Diabetes in her father and mother; Healthy in her brother, daughter, and sister. There is no history of Amblyopia, Blindness, Glaucoma, Macular degeneration, Retinal detachment, Strabismus, or Retinitis pigmentosa.  ROS:   Please see the history of present illness.     All other systems reviewed and are negative.   Labs/Other Tests and Data Reviewed:    Recent Labs: 01/29/2018: ALT 14; Magnesium 2.1; TSH 1.275 07/17/2018: BUN 23; Creatinine, Ser 0.98; Hemoglobin 11.9; Platelets 434; Potassium 4.5; Sodium 134   Recent Lipid Panel No results found for: CHOL, TRIG, HDL, CHOLHDL, LDLCALC, LDLDIRECT  Wt Readings from Last 3 Encounters:  07/28/18 162 lb (73.5 kg)  07/20/18 170 lb 8 oz (77.3 kg)  07/17/18 170 lb 8 oz (77.3 kg)     Objective:    Vital Signs:  There were no vitals taken for this visit.   ASSESSMENT & PLAN:    1.  Chronic HTN -her BPs have been running in the normal range lately.  Her last BP  at MDs office was 144/47mmHg.   -she has moved and has not unpacked her BP cuff -Her BP goal is <130/59mmHg -I have instructed her to increase Carvedilol to 12.5mg  BID -her creatinine was normal at 0.98 on 07/17/2018 -I am going to send her a Rx for a new BP cuff -I have asked her to check her BP daily for a week and call with the results -I will send her a handout on low Na diet.  2.  Chest pain -she never had ETT done -she has not had any further CP  3.  SOB -2D echo was normal -SOB has resolved after her delivery  COVID-19 Education: The signs and symptoms of COVID-19 were discussed with the patient and how to seek care for testing (follow up with PCP or arrange E-visit).  The importance of social distancing was discussed today.  Patient Risk:   After full review of this patient's clinical status, I feel that they are at least moderate risk at this time.  Time:   Today, I have spent 15  minutes directly with the patient on telephone discussing medical problems including HTN.  We also reviewed the symptoms of COVID 19 and the ways to protect against contracting the virus with telehealth technology.  I spent an additional 5 minutes reviewing patient's chart including office visit notes and labs.  Medication Adjustments/Labs and Tests Ordered: Current medicines are reviewed at length with the patient today.  Concerns regarding medicines are outlined above.  Tests Ordered: No orders of the defined types were placed in this encounter.  Medication Changes: No orders of the defined types were placed in this encounter.   Disposition:  Follow up in 3 month(s) video visit  Signed, Fransico Him, MD  08/08/2018 7:59 AM    [place]

## 2018-08-09 NOTE — Progress Notes (Addendum)
[facility] Note  08/10/2018     CHIEF COMPLAINT Patient presents for Post-op Follow-up   HISTORY OF PRESENT ILLNESS: Barbara Donovan is a 28 y.o. female who presents to the clinic today for:   HPI    Post-op Follow-up    In left eye.  Vision is improved.  I, the attending physician,  performed the HPI with the patient and updated documentation appropriately.          Comments    28 y/o female pt here for 3 wk f/u s/p PPV/MP/EL/silicon oil + IVA OS on 07/20/18.  Pt doing well, and reports vision is starting to clear peripherally OS.  No change in [place] OD.  Denies pain, flashes, floaters.  PF QID OS and PSO QHS OS.  BS 173 this a.m.  A1C 9.7.       Last edited by Bernarda Caffey, MD on 08/10/2018  5:01 PM. (History)      Referring physician: No referring provider defined for this encounter.  HISTORICAL INFORMATION:   Selected notes from the MEDICAL RECORD NUMBER Referred by Dr. [name] for concern of bilateral CRVO LEE-  Ocular Hx-  PMH-     CURRENT MEDICATIONS: Current Outpatient Medications (Ophthalmic Drugs)  Medication Sig  . prednisoLONE acetate (PRED FORTE) 1 % ophthalmic suspension Place 1 drop into the left eye 4 (four) times daily.   No current facility-administered medications for this visit.  (Ophthalmic Drugs)   Current Outpatient Medications (Other)  Medication Sig  . Blood Pressure Monitoring (OMRON 3 SERIES BP MONITOR) DEVI 1 each by Does not apply route daily. Take daily blood pressure. Please report back to Dr. Radford Pax in 2 wks.  . carvedilol (COREG) 12.5 MG tablet Take 1 tablet (12.5 mg total) by mouth 2 (two) times daily.  . insulin aspart (NOVOLOG) 100 UNIT/ML injection For use in pump, total of 60 units per day.  . Insulin Pen Needle (PEN NEEDLES) 32G X 5 MM MISC 1 Device by Does not apply route 4 (four) times daily.  [name] VERIO test strip USED TO CHECK BLOOD SUGARS FIVE TIMES DAILY.   No current  facility-administered medications for this visit.  (Other)      REVIEW OF SYSTEMS: ROS    Positive for: Genitourinary, Endocrine, Eyes, Respiratory   Negative for: Constitutional, Gastrointestinal, Skin, Musculoskeletal, HENT, Cardiovascular, Psychiatric, Allergic/Imm, Heme/Lymph   Last edited by Bernarda Caffey, MD on 08/10/2018  5:01 PM. (History)       ALLERGIES No Known Allergies  PAST MEDICAL HISTORY Past Medical History:  Diagnosis Date  . Asthma 10/30/2013  . Cataract    NS OU  . Chronic hypertension during pregnancy, antepartum 08/19/2017  . Dehydration 01/28/2018  . Depression during pregnancy, antepartum 07/07/2017   6/20: Short trial of zoloft previously, reports didn't help much but also didn't give it a chance Discussed r/b/a SSRIs in pregnancy, agrees to try Zoloft again, rx sent No SI/HI/red flags  . Diabetes ([place])    TYPE I  . Diabetic retinopathy ([place]) 06/09/2017   07/2017 with bilateral severe diabetic non-proliferative retinopathy with macular edema.  Marland Kitchen HTN (hypertension)   . Hypertensive retinopathy    OU  . Hypokalemia 01/22/2018  . Hypomagnesemia 01/28/2018  . Intractable nausea and vomiting 01/22/2018  . Intrauterine growth restriction (IUGR) affecting care of mother 12/22/2017  . Morbid obesity ([place])   . 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 Proteinuria due to type 1 diabetes mellitus ([place]) 11/02/2017  Baseline Pr: Cr 10.23  . Severe hyperemesis gravidarum 10/30/2017  . Type I diabetes mellitus ([place]) 07/07/2017   Current Diabetic Medications:  Insulin  [x]  Aspirin 81 mg daily after 12 weeks (? A2/B GDM)  Required Referrals for A1GDM or A2GDM: [x]  Diabetes Education and Testing Supplies [x]  Nutrition Cousult  For A2/B GDM or higher classes of DM [x]  Diabetes Education and Testing Supplies [x]  Nutrition Counsult [x]  Fetal ECHO after 20 weeks  [x]  Eye exam for retina evaluation - severe retinopathy 7/19  Base  . Ventricular septal defect (VSD) of fetus in  singleton pregnancy, antepartum 09/30/2017   May go to newborn nursery per Dr. Lenard Simmer Echo prior to discharge   Past Surgical History:  Procedure Laterality Date  . [place] VITRECTOMY WITH 20 GAUGE MVR PORT FOR MACULAR HOLE Left 07/20/2018   Procedure: 25 GAUGE PARS PLANA VITRECTOMY LEFT EYE;  Surgeon: Bernarda Caffey, MD;  Location: [place];  Service: Ophthalmology;  Laterality: Left;  . CESAREAN SECTION N/A 12/26/2017   Procedure: CESAREAN SECTION;  Surgeon: Osborne Oman, MD;  Location: [place];  Service: Obstetrics;  Laterality: N/A;  . EYE EXAMINATION UNDER ANESTHESIA Right 07/20/2018   Procedure: Eye Exam Under Anesthesia RIGHT EYE;  Surgeon: Bernarda Caffey, MD;  Location: [place];  Service: Ophthalmology;  Laterality: Right;  . EYE SURGERY    . INJECTION OF SILICONE OIL Left 0/09/8117   Procedure: Injection Of Silicone Oil LEFT EYE;  Surgeon: Bernarda Caffey, MD;  Location: [place];  Service: Ophthalmology;  Laterality: Left;  . LASER PHOTO ABLATION Right 07/20/2018   Procedure: Laser Photo Ablation RIGHT EYE;  Surgeon: Bernarda Caffey, MD;  Location: [place];  Service: Ophthalmology;  Laterality: Right;  . MEMBRANE PEEL Left 07/20/2018   Procedure: Membrane Peel LEFT EYE;  Surgeon: Bernarda Caffey, MD;  Location: [place];  Service: Ophthalmology;  Laterality: Left;  . MITOMYCIN C APPLICATION Bilateral 01/22/7827   Procedure: Avastin Application;  Surgeon: Bernarda Caffey, MD;  Location: [place];  Service: Ophthalmology;  Laterality: Bilateral;  . NO PAST SURGERIES    . PHOTOCOAGULATION WITH LASER Left 07/20/2018   Procedure: Photocoagulation With Laser LEFT EYE;  Surgeon: Bernarda Caffey, MD;  Location: [place];  Service: Ophthalmology;  Laterality: Left;  . RETINAL DETACHMENT SURGERY Left 07/20/2018   Dr. Bernarda Caffey  . WISDOM TOOTH EXTRACTION      FAMILY HISTORY Family History  Problem Relation Age of Onset  . Diabetes Mother   . Aneurysm Mother 16  . Diabetes Father   . Cataracts Father    . Healthy Sister   . Healthy Brother   . Healthy Daughter   . Amblyopia Neg Hx   . Blindness Neg Hx   . Glaucoma Neg Hx   . Macular degeneration Neg Hx   . Retinal detachment Neg Hx   . Strabismus Neg Hx   . Retinitis pigmentosa Neg Hx     SOCIAL HISTORY Social History   Tobacco Use  . Smoking status: Never Smoker  . Smokeless tobacco: Never Used  Substance Use Topics  . Alcohol use: Not Currently    Comment: SOCIALLY  . Drug use: Never         OPHTHALMIC EXAM:  Base Eye Exam    Visual Acuity (Snellen - Linear)      Right Left   Dist cc 20/30 +2 CF @ 1'   Dist ph cc NI 20/250 -   Correction: Glasses  Tonometry (Tonopen, 9:06 AM)      Right Left   Pressure 16 16       Pupils      Dark Light Shape React APD   Right 4 3 Round Minimal None   Left 4 4 Round Minimal None       Visual Fields (Counting fingers)      Left Right     Full   Restrictions Partial outer superior temporal, inferior temporal, superior nasal, inferior nasal deficiencies        Extraocular Movement      Right Left    Full, Ortho Full, Ortho       Neuro/Psych    Oriented x3: Yes   Mood/Affect: Normal       Dilation    Both eyes: 1.0% Mydriacyl, 2.5% Phenylephrine @ 9:06 AM        Slit Lamp and Fundus Exam    Slit Lamp Exam      Right Left   Lids/Lashes Normal Mild periorbital edema   Conjunctiva/Sclera White and quiet 2+ injection ST , sutures dissolving   Cornea 1+ Punctate epithelial erosions mild endo pigment inferiorly   Anterior Chamber Deep and quiet Deep and quiet   Iris Round and dilated, No NVI Round and dilated to 58mm, No NVI   Lens trace Nuclear sclerosis and cortical changes 1+Nuclear sclerosis, 1+cortical changes, 1+PSC   Vitreous trace pigment in anterior vitreous, mild residual VH settled inferiorly post vitrectomy, good oil fill       Fundus Exam      Right Left   Disc Pink and Sharp; +fibrosis and NVD - regressing Pink and sharp, improved  peripapillary edema   C/D Ratio 0.5 0.2   Macula Flat, Blunted foveal reflex, subhyaloid heme along IT arcades flat under oil   Vessels Vascular attenuation, Tortuous, +NVE superior to disc and all arcades tortuous, attenuated; nasal NV partially regressing and flat under oil   Periphery Attached, scattered DBH, nasal subhyaloid heme just off disc, good 360 PRP attached under oil; peripapillary fibrosis regressing under oil        Refraction    Manifest Refraction      Sphere Cylinder Axis Dist VA   Right -1.75 +0.25 020 20/25-2   Left -1.75 +0.50 005 20/CF          IMAGING AND PROCEDURES  Imaging and Procedures for @TODAY @  OCT, Retina - OU - Both Eyes       Right Eye Quality was good. Central Foveal Thickness: 228. Progression has been stable. Findings include no SRF, intraretinal hyper-reflective material, no IRF, normal foveal contour, vitreous traction, macular pucker, epiretinal membrane, preretinal fibrosis (Mild interval improvement in vitreous opacities).   Left Eye Quality was borderline. Central Foveal Thickness: 421. Progression has improved. Findings include abnormal foveal contour, preretinal fibrosis, epiretinal membrane, subretinal fluid, intraretinal hyper-reflective material, intraretinal fluid (Attached under oil; Interval improvement in peripapillary SRF/edema and IRHM).   Notes *Images captured and stored on drive  Diagnosis / Impression:  PDR OU OD: mild preretinal fibrosis/ERM/pucker but Interval improvement in vitreous opacities and subhyaloid heme OS: retina attached under oil; Interval improvement in peripapillary SRF/edema and IRHM  Clinical management:  See below  Abbreviations: NFP - Normal foveal profile. CME - cystoid macular edema. PED - pigment epithelial detachment. IRF - intraretinal fluid. SRF - subretinal fluid. EZ - ellipsoid zone. ERM - epiretinal membrane. ORA - outer retinal atrophy. ORT - outer retinal tubulation. SRHM - subretinal  hyper-reflective material  ASSESSMENT/PLAN:    ICD-10-CM   1. 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 Traction detachment of left retina  H33.42   3. Vitreous hemorrhage of left eye (HCC)  H43.12   4. Retinal edema  H35.81 OCT, Retina - OU - Both Eyes  5. Essential hypertension  I10   6. Hypertensive retinopathy of both eyes  H35.033   7. Nuclear sclerosis of both eyes  H25.13     1-4. 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 (OS > OD)  - s/p PRP OS (11.26.19)  - s/p PRP OD (06.01.20)  - lost to f/u following 11.26.19 appt due to complicated pregnancy, premature birth of baby w/ extended NICU stay and congenital heart defect, and post-partum health issues  - history of vision loss OS since December 2019  - OS with chronic VH and TRD, likely present since Dec 2019  - s/p IVA OS #1 (06.23.20)  - Now POW3 s/p PPV/MP/EL/silicon oil + IVA OS for VH and TRD from DM1, 07.02.20             - doing well, vision improving             - retina attached under silicon oil -- improved heme and fibrosis             - IOP 16             - cont  PF 3x/day OS    Brimonidine BID OS                         PSO ung QID OS              - avoid laying flat on back              - post op drop and positioning instructions reviewed              - tylenol/ibuprofen for pain  - s/p fill in PRP and IVA OD 07.02.2020 in operating room  - clear from a retina standpoint to return to work on Monday, 7.27.2020  - f/u 4 weeks   5,6. Hypertensive retinopathy OU  - there was concern for pregnancy related hypertension (I.e. Pre-eclampsia, Eclampsia, HELLP, etc) during pregnancy  - also during pregnancy (7.17.2019), had bilateral CRVO w/ CME (OD > OS) s/p PRP OS  - discussed importance of tight BP control  - monitor  7. Nuclear sclerosis OU  - The symptoms of cataract, surgical options, and treatments and  risks were discussed with patient.  - discussed diagnosis and likely progression of cataract OS following PPV w/ silicon oil  - monitor for now    Ophthalmic Meds Ordered this visit:  No orders of the defined types were placed in this encounter.     Return in about 4 weeks (around 09/07/2018) for POV.  There are no Patient Instructions on file for this visit.   Explained the diagnoses, plan, and follow up with the patient and they expressed understanding.  Patient expressed understanding of the importance of proper follow up care.  This document serves as a record of services personally performed by Gardiner Sleeper, MD, PhD. It was created on their behalf by Ernest Mallick, OA, an ophthalmic assistant. The creation of this record is the provider's dictation and/or activities during the visit.    Electronically signed by: Ernest Mallick,  OA  07.22.2020 12:50 AM    Gardiner Sleeper, M.D., Ph.D. Diseases & Surgery of the Retina and Vitreous [facility]  I have reviewed the above documentation for accuracy and completeness, and I agree with the above. Gardiner Sleeper, M.D., Ph.D. 08/11/18 12:50 AM    Abbreviations: M myopia (nearsighted); A astigmatism; H hyperopia (farsighted); P presbyopia; Mrx spectacle prescription;  CTL contact lenses; OD right eye; OS left eye; OU both eyes  XT exotropia; ET esotropia; PEK punctate epithelial keratitis; PEE punctate epithelial erosions; DES dry eye syndrome; MGD meibomian gland dysfunction; ATs artificial tears; PFAT's preservative free artificial tears; [place] nuclear sclerotic cataract; PSC posterior subcapsular cataract; ERM epi-retinal membrane; PVD posterior vitreous detachment; RD retinal detachment; DM diabetes mellitus; DR diabetic retinopathy; NPDR non-proliferative diabetic retinopathy; PDR proliferative diabetic retinopathy; CSME clinically significant macular edema; DME diabetic macular edema; dbh dot blot hemorrhages; CWS  cotton wool spot; POAG primary open angle glaucoma; C/D cup-to-disc ratio; HVF humphrey visual field; GVF goldmann visual field; OCT optical coherence tomography; IOP intraocular pressure; BRVO Branch retinal vein occlusion; CRVO central retinal vein occlusion; CRAO central retinal artery occlusion; BRAO branch retinal artery occlusion; RT retinal tear; SB scleral buckle; PPV pars plana vitrectomy; VH Vitreous hemorrhage; PRP panretinal laser photocoagulation; IVK intravitreal kenalog; VMT vitreomacular traction; MH Macular hole;  NVD neovascularization of the disc; NVE neovascularization elsewhere; AREDS age related eye disease study; ARMD age related macular degeneration; POAG primary open angle glaucoma; EBMD epithelial/anterior basement membrane dystrophy; ACIOL anterior chamber intraocular lens; IOL intraocular lens; PCIOL posterior chamber intraocular lens; Phaco/IOL phacoemulsification with intraocular lens placement; [place] photorefractive keratectomy; LASIK laser assisted in situ keratomileusis; HTN hypertension; DM diabetes mellitus; COPD chronic obstructive pulmonary disease

## 2018-08-10 ENCOUNTER — Other Ambulatory Visit: Payer: Self-pay

## 2018-08-10 ENCOUNTER — Encounter (INDEPENDENT_AMBULATORY_CARE_PROVIDER_SITE_OTHER): Payer: Self-pay | Admitting: Ophthalmology

## 2018-08-10 ENCOUNTER — Ambulatory Visit (INDEPENDENT_AMBULATORY_CARE_PROVIDER_SITE_OTHER): Payer: BC Managed Care – PPO | Admitting: Ophthalmology

## 2018-08-10 DIAGNOSIS — I10 Essential (primary) hypertension: Secondary | ICD-10-CM

## 2018-08-10 DIAGNOSIS — H4312 Vitreous hemorrhage, left eye: Secondary | ICD-10-CM

## 2018-08-10 DIAGNOSIS — H3342 Traction detachment of retina, left eye: Secondary | ICD-10-CM

## 2018-08-10 DIAGNOSIS — H2513 Age-related nuclear cataract, bilateral: Secondary | ICD-10-CM

## 2018-08-10 DIAGNOSIS — H3581 Retinal edema: Secondary | ICD-10-CM

## 2018-08-10 DIAGNOSIS — E103513 Type 1 diabetes mellitus with proliferative diabetic retinopathy with macular edema, bilateral: Secondary | ICD-10-CM

## 2018-08-10 DIAGNOSIS — H35033 Hypertensive retinopathy, bilateral: Secondary | ICD-10-CM

## 2018-08-10 LAB — CERVICOVAGINAL ANCILLARY ONLY
Bacterial vaginitis: POSITIVE — AB
Candida vaginitis: NEGATIVE
Chlamydia: NEGATIVE
Neisseria Gonorrhea: NEGATIVE
Trichomonas: NEGATIVE

## 2018-08-11 ENCOUNTER — Ambulatory Visit: Payer: BC Managed Care – PPO | Admitting: Endocrinology

## 2018-08-12 ENCOUNTER — Other Ambulatory Visit: Payer: Self-pay

## 2018-08-12 MED ORDER — METRONIDAZOLE 500 MG PO TABS: 500.0000 mg | ORAL_TABLET | Freq: Two times a day (BID) | ORAL | 0 refills | Status: DC

## 2018-08-21 ENCOUNTER — Other Ambulatory Visit: Payer: Self-pay

## 2018-08-21 ENCOUNTER — Ambulatory Visit (INDEPENDENT_AMBULATORY_CARE_PROVIDER_SITE_OTHER): Payer: BC Managed Care – PPO | Admitting: Emergency Medicine

## 2018-08-21 DIAGNOSIS — Z3042 Encounter for surveillance of injectable contraceptive: Secondary | ICD-10-CM | POA: Diagnosis not present

## 2018-08-21 MED ORDER — MEDROXYPROGESTERONE ACETATE 150 MG/ML IM SUSP
150.0000 mg | Freq: Once | INTRAMUSCULAR | Status: AC
  Administered 2018-08-21: 150 mg via INTRAMUSCULAR

## 2018-08-21 NOTE — Progress Notes (Signed)
Barbara Donovan here for Depo-Provera  Injection.  Injection administered without complication. Patient will return in 3 months for next injection.  [name], RN 08/21/18

## 2018-08-21 NOTE — Progress Notes (Signed)
Patient seen and assessed by nursing staff during this encounter. I have reviewed the chart and agree with the documentation and plan.  Marcille Buffy DNP, CNM  08/21/18  12:04 PM

## 2018-08-24 LAB — POCT PREGNANCY, URINE: Preg Test, Ur: NEGATIVE

## 2018-08-25 ENCOUNTER — Encounter (INDEPENDENT_AMBULATORY_CARE_PROVIDER_SITE_OTHER): Payer: BC Managed Care – PPO | Admitting: Ophthalmology

## 2018-09-08 ENCOUNTER — Encounter (INDEPENDENT_AMBULATORY_CARE_PROVIDER_SITE_OTHER): Payer: BC Managed Care – PPO | Admitting: Ophthalmology

## 2018-09-14 ENCOUNTER — Encounter (INDEPENDENT_AMBULATORY_CARE_PROVIDER_SITE_OTHER): Payer: Self-pay | Admitting: Ophthalmology

## 2018-09-14 NOTE — Progress Notes (Addendum)
[facility] Note  09/18/2018     CHIEF COMPLAINT Patient presents for Retina Follow Up   HISTORY OF PRESENT ILLNESS: Barbara Donovan is a 28 y.o. female who presents to the clinic today for:   HPI    Retina Follow Up    Patient presents with  Diabetic Retinopathy.  In both eyes.  Severity is moderate.  Duration of 4 weeks.  Since onset it is gradually worsening (OD more cloudy).  I, the attending physician,  performed the HPI with the patient and updated documentation appropriately.          Comments    Patient states vision slightly worse OD-seems more cloudy since last week. Vision seems the same OS. BS was 88 this am. Last a1c was slightly over 9 about a month ago.        Last edited by Bernarda Caffey, MD on 09/18/2018  2:31 PM. (History)    pt states last Tuesday she started noticing floaters in her right eye, she states she walked downstairs in her house and the living room looked smokey, she says the cloudiness has cleared up some, but still seems to be there  Referring physician: No referring provider defined for this encounter.  HISTORICAL INFORMATION:   Selected notes from the MEDICAL RECORD NUMBER Referred by Dr. [name] for concern of bilateral CRVO LEE-  Ocular Hx-  PMH-     CURRENT MEDICATIONS: Current Outpatient Medications (Ophthalmic Drugs)  Medication Sig  . bacitracin-polymyxin b (POLYSPORIN) ophthalmic ointment Place 1 application into the left eye 4 (four) times daily. apply to eye every 12 hours while awake  . brimonidine (ALPHAGAN) 0.2 % ophthalmic solution Place 1 drop into the left eye 2 (two) times daily.  . prednisoLONE acetate (PRED FORTE) 1 % ophthalmic suspension Place 1 drop into the left eye 4 (four) times daily.   No current facility-administered medications for this visit.  (Ophthalmic Drugs)   Current Outpatient Medications (Other)  Medication Sig  . Blood Pressure Monitoring (OMRON 3 SERIES BP MONITOR)  DEVI 1 each by Does not apply route daily. Take daily blood pressure. Please report back to Dr. Radford Pax in 2 wks.  . carvedilol (COREG) 12.5 MG tablet Take 1 tablet (12.5 mg total) by mouth 2 (two) times daily.  . insulin aspart (NOVOLOG) 100 UNIT/ML injection For use in pump, total of 60 units per day.  . Insulin Pen Needle (PEN NEEDLES) 32G X 5 MM MISC 1 Device by Does not apply route 4 (four) times daily.  . metroNIDAZOLE (FLAGYL) 500 MG tablet Take 1 tablet (500 mg total) by mouth 2 (two) times daily.  [name] VERIO test strip USED TO CHECK BLOOD SUGARS FIVE TIMES DAILY.   No current facility-administered medications for this visit.  (Other)      REVIEW OF SYSTEMS: ROS    Positive for: Genitourinary, Endocrine, Eyes, Respiratory   Negative for: Constitutional, Gastrointestinal, Skin, Musculoskeletal, HENT, Cardiovascular, Psychiatric, Allergic/Imm, Heme/Lymph   Last edited by Roselee Nova D, COT on 09/18/2018  1:37 PM. (History)       ALLERGIES No Known Allergies  PAST MEDICAL HISTORY Past Medical History:  Diagnosis Date  . Asthma 10/30/2013  . Cataract    NS OU  . Chronic hypertension during pregnancy, antepartum 08/19/2017  . Dehydration 01/28/2018  . Depression during pregnancy, antepartum 07/07/2017   6/20: Short trial of zoloft previously, reports didn't help much but also didn't give it a chance Discussed  r/b/a SSRIs in pregnancy, agrees to try Zoloft again, rx sent No SI/HI/red flags  . Diabetes ([place])    TYPE I  . Diabetic retinopathy ([place]) 06/09/2017   07/2017 with bilateral severe diabetic non-proliferative retinopathy with macular edema.  Marland Kitchen HTN (hypertension)   . Hypertensive retinopathy    OU  . Hypokalemia 01/22/2018  . Hypomagnesemia 01/28/2018  . Intractable nausea and vomiting 01/22/2018  . Intrauterine growth restriction (IUGR) affecting care of mother 12/22/2017  . Morbid obesity ([place])   . 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 Proteinuria due to type 1  diabetes mellitus ([place]) 11/02/2017   Baseline Pr: Cr 10.23  . Severe hyperemesis gravidarum 10/30/2017  . Type I diabetes mellitus ([place]) 07/07/2017   Current Diabetic Medications:  Insulin  [x]  Aspirin 81 mg daily after 12 weeks (? A2/B GDM)  Required Referrals for A1GDM or A2GDM: [x]  Diabetes Education and Testing Supplies [x]  Nutrition Cousult  For A2/B GDM or higher classes of DM [x]  Diabetes Education and Testing Supplies [x]  Nutrition Counsult [x]  Fetal ECHO after 20 weeks  [x]  Eye exam for retina evaluation - severe retinopathy 7/19  Base  . Ventricular septal defect (VSD) of fetus in singleton pregnancy, antepartum 09/30/2017   May go to newborn nursery per Dr. Lenard Simmer Echo prior to discharge   Past Surgical History:  Procedure Laterality Date  . [place] VITRECTOMY WITH 20 GAUGE MVR PORT FOR MACULAR HOLE Left 07/20/2018   Procedure: 25 GAUGE PARS PLANA VITRECTOMY LEFT EYE;  Surgeon: Bernarda Caffey, MD;  Location: [facility];  Service: Ophthalmology;  Laterality: Left;  . CESAREAN SECTION N/A 12/26/2017   Procedure: CESAREAN SECTION;  Surgeon: Osborne Oman, MD;  Location: [place];  Service: Obstetrics;  Laterality: N/A;  . EYE EXAMINATION UNDER ANESTHESIA Right 07/20/2018   Procedure: Eye Exam Under Anesthesia RIGHT EYE;  Surgeon: Bernarda Caffey, MD;  Location: [place];  Service: Ophthalmology;  Laterality: Right;  . EYE SURGERY    . INJECTION OF SILICONE OIL Left [id number]   Procedure: Injection Of Silicone Oil LEFT EYE;  Surgeon: Bernarda Caffey, MD;  Location: [place];  Service: Ophthalmology;  Laterality: Left;  . LASER PHOTO ABLATION Right 07/20/2018   Procedure: Laser Photo Ablation RIGHT EYE;  Surgeon: Bernarda Caffey, MD;  Location: [place];  Service: Ophthalmology;  Laterality: Right;  . MEMBRANE PEEL Left 07/20/2018   Procedure: Membrane Peel LEFT EYE;  Surgeon: Bernarda Caffey, MD;  Location: [place];  Service: Ophthalmology;  Laterality: Left;  . MITOMYCIN C APPLICATION Bilateral  [id number]   Procedure: Avastin Application;  Surgeon: Bernarda Caffey, MD;  Location: [place];  Service: Ophthalmology;  Laterality: Bilateral;  . NO PAST SURGERIES    . PHOTOCOAGULATION WITH LASER Left 07/20/2018   Procedure: Photocoagulation With Laser LEFT EYE;  Surgeon: Bernarda Caffey, MD;  Location: [place];  Service: Ophthalmology;  Laterality: Left;  . RETINAL DETACHMENT SURGERY Left 07/20/2018   Dr. Bernarda Caffey  . WISDOM TOOTH EXTRACTION      FAMILY HISTORY Family History  Problem Relation Age of Onset  . Diabetes Mother   . Aneurysm Mother 81  . Diabetes Father   . Cataracts Father   . Healthy Sister   . Healthy Brother   . Healthy Daughter   . Amblyopia Neg Hx   . Blindness Neg Hx   . Glaucoma Neg Hx   . Macular degeneration Neg Hx   . Retinal detachment Neg Hx   . Strabismus Neg Hx   .  Retinitis pigmentosa Neg Hx     SOCIAL HISTORY Social History   Tobacco Use  . Smoking status: Never Smoker  . Smokeless tobacco: Never Used  Substance Use Topics  . Alcohol use: Not Currently    Comment: SOCIALLY  . Drug use: Never         OPHTHALMIC EXAM:  Base Eye Exam    Visual Acuity (Snellen - Linear)      Right Left   Dist cc 20/30 +1 CF at 3'   Dist ph cc 20/20 -1 20/200   Correction: Glasses       Tonometry (Tonopen, 1:51 PM)      Right Left   Pressure 18 19       Pupils      Dark Light Shape React APD   Right 5 5 Round Minimal None   Left 4 4 Round Minimal None       Visual Fields (Counting fingers)      Left Right     Full   Restrictions Partial outer superior temporal, inferior temporal deficiencies        Extraocular Movement      Right Left    Full, Ortho Full, Ortho       Neuro/Psych    Oriented x3: Yes   Mood/Affect: Normal       Dilation    Both eyes: 1.0% Mydriacyl, 2.5% Phenylephrine @ 1:51 PM        Slit Lamp and Fundus Exam    Slit Lamp Exam      Right Left   Lids/Lashes Normal Mild periorbital edema   Conjunctiva/Sclera  White and quiet 2+ injection ST , sutures dissolving   Cornea 1+ Punctate epithelial erosions mild endo pigment inferiorly   Anterior Chamber Deep and quiet Deep and quiet   Iris Round, moderate dilation, No NVI Round and dilated to 27mm, No NVI   Lens trace Nuclear sclerosis and cortical changes 1+Nuclear sclerosis, 1+cortical changes, 3+PSC   Vitreous trace pigment in anterior vitreous, VH settled inferiorly post vitrectomy, good oil fill       Fundus Exam      Right Left   Disc Pink and Sharp; +fibrosis and NVD - regressing Pink and sharp   C/D Ratio 0.5 0.1   Macula Flat, Blunted foveal reflex, scattered MA/IRH/exudate subhyaloid heme along IT arcades flat under oil, improving edema   Vessels Vascular attenuation, Tortuous, regressing NV attenuated   Periphery Attached, scattered IRH/DBH, nasal subhyaloid heme just off disc - improving, good 360 PRP attached under oil; peripapillary fibrosis regressing under oil        Refraction    Wearing Rx      Sphere Cylinder Axis   Right -1.75 +0.25 012   Left -1.75 +0.50 002       Manifest Refraction      Sphere Cylinder Axis Dist VA   Right -2.25 +0.50 015 20/25-2   Left -1.75 +0.50 180 CF  Ph better than refraction OU          IMAGING AND PROCEDURES  Imaging and Procedures for @TODAY @  OCT, Retina - OU - Both Eyes       Right Eye Quality was good. Central Foveal Thickness: 222. Progression has been stable. Findings include no SRF, intraretinal hyper-reflective material, no IRF, normal foveal contour, vitreous traction, macular pucker, epiretinal membrane, preretinal fibrosis (Mild interval improvement in vitreous opacities).   Left Eye Quality was good. Central Foveal Thickness: 370. Progression has improved. Findings  include abnormal foveal contour, preretinal fibrosis, epiretinal membrane, subretinal fluid, intraretinal hyper-reflective material, intraretinal fluid (Attached under oil; Interval improvement in IRF/SRF and  retinal edema).   Notes *Images captured and stored on drive  Diagnosis / Impression:  PDR OU OD: mild preretinal fibrosis/ERM/pucker but Interval improvement in vitreous opacities and subhyaloid heme OS: retina attached under oil; Interval improvement in interval improvement in IRF/SRF and retinal edema   Clinical management:  See below  Abbreviations: NFP - Normal foveal profile. CME - cystoid macular edema. PED - pigment epithelial detachment. IRF - intraretinal fluid. SRF - subretinal fluid. EZ - ellipsoid zone. ERM - epiretinal membrane. ORA - outer retinal atrophy. ORT - outer retinal tubulation. SRHM - subretinal hyper-reflective material                  ASSESSMENT/PLAN:    ICD-10-CM   1. 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 Traction detachment of left retina  H33.42   3. Vitreous hemorrhage of left eye (HCC)  H43.12   4. Retinal edema  H35.81 OCT, Retina - OU - Both Eyes  5. Essential hypertension  I10   6. Hypertensive retinopathy of both eyes  H35.033   7. Combined forms of age-related cataract of both eyes  H25.813     1-4. 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 (OS > OD)  - s/p PRP OS (11.26.19)  - s/p PRP OD (06.01.20)  - lost to f/u following 11.26.19 appt due to complicated pregnancy, premature birth of baby w/ extended NICU stay and congenital heart defect, and post-partum health issues  - history of vision loss OS since December 2019  - OS with chronic VH and TRD, likely present since Dec 2019  - s/p IVA OS #1 (06.23.20), #2 (07.02.20)  - Now POW8 s/p PPV/MP/EL/silicon oil + IVA OS for VH and TRD from DM1, 07.02.20             - doing well, vision improving             - retina attached under silicon oil -- improved heme, fibrosis and edema             - IOP 19             - start  PF taper -- 3,2,1 drops daily, decrease every 2 weeks OS  - decrease Brimonidine  QDaily OS             - avoid laying flat on back              - post op drop and positioning instructions reviewed              - tylenol/ibuprofen for pain  - s/p fill in PRP and IVA OD 07.02.2020 in operating room  - f/u 4-6 weeks -- DFE/OCT   5,6. Hypertensive retinopathy OU  - there was concern for pregnancy related hypertension (I.e. Pre-eclampsia, Eclampsia, HELLP, etc) during pregnancy  - also during pregnancy (7.17.2019), had bilateral CRVO w/ CME (OD > OS) s/p PRP OS  - discussed importance of tight BP control  - monitor  7. 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  - discussed diagnosis and likely progression of cataract OS following PPV w/ silicon oil  - visually significant PSC developing OS  - monitor for  now, but discussed possible need for CE/PCIOL OS prior to silicon oil removal    Ophthalmic Meds Ordered this visit:  No orders of the defined types were placed in this encounter.     Return for f/u 4-6 weeks, PDR OU, DFE, OCT.  There are no Patient Instructions on file for this visit.   Explained the diagnoses, plan, and follow up with the patient and they expressed understanding.  Patient expressed understanding of the importance of proper follow up care.  This document serves as a record of services personally performed by Gardiner Sleeper, MD, PhD. It was created on their behalf by Roselee Nova, COMT. The creation of this record is the provider's dictation and/or activities during the visit.  Electronically signed by: Roselee Nova, COMT 09/18/18 3:59 PM   Gardiner Sleeper, M.D., Ph.D. Diseases & Surgery of the Retina and Vitreous [facility]  I have reviewed the above documentation for accuracy and completeness, and I agree with the above. Gardiner Sleeper, M.D., Ph.D. 09/18/18 3:59 PM    Abbreviations: M myopia (nearsighted); A astigmatism; H  hyperopia (farsighted); P presbyopia; Mrx spectacle prescription;  CTL contact lenses; OD right eye; OS left eye; OU both eyes  XT exotropia; ET esotropia; PEK punctate epithelial keratitis; PEE punctate epithelial erosions; DES dry eye syndrome; MGD meibomian gland dysfunction; ATs artificial tears; PFAT's preservative free artificial tears; [place] nuclear sclerotic cataract; PSC posterior subcapsular cataract; ERM epi-retinal membrane; PVD posterior vitreous detachment; RD retinal detachment; DM diabetes mellitus; DR diabetic retinopathy; NPDR non-proliferative diabetic retinopathy; PDR proliferative diabetic retinopathy; CSME clinically significant macular edema; DME diabetic macular edema; dbh dot blot hemorrhages; CWS cotton wool spot; POAG primary open angle glaucoma; C/D cup-to-disc ratio; HVF humphrey visual field; GVF goldmann visual field; OCT optical coherence tomography; IOP intraocular pressure; BRVO Branch retinal vein occlusion; CRVO central retinal vein occlusion; CRAO central retinal artery occlusion; BRAO branch retinal artery occlusion; RT retinal tear; SB scleral buckle; PPV pars plana vitrectomy; VH Vitreous hemorrhage; PRP panretinal laser photocoagulation; IVK intravitreal kenalog; VMT vitreomacular traction; MH Macular hole;  NVD neovascularization of the disc; NVE neovascularization elsewhere; AREDS age related eye disease study; ARMD age related macular degeneration; POAG primary open angle glaucoma; EBMD epithelial/anterior basement membrane dystrophy; ACIOL anterior chamber intraocular lens; IOL intraocular lens; PCIOL posterior chamber intraocular lens; Phaco/IOL phacoemulsification with intraocular lens placement; [place] photorefractive keratectomy; LASIK laser assisted in situ keratomileusis; HTN hypertension; DM diabetes mellitus; COPD chronic obstructive pulmonary disease

## 2018-09-18 ENCOUNTER — Other Ambulatory Visit: Payer: Self-pay

## 2018-09-18 ENCOUNTER — Encounter (INDEPENDENT_AMBULATORY_CARE_PROVIDER_SITE_OTHER): Payer: Self-pay | Admitting: Ophthalmology

## 2018-09-18 ENCOUNTER — Ambulatory Visit (INDEPENDENT_AMBULATORY_CARE_PROVIDER_SITE_OTHER): Payer: BC Managed Care – PPO | Admitting: Ophthalmology

## 2018-09-18 DIAGNOSIS — H3581 Retinal edema: Secondary | ICD-10-CM

## 2018-09-18 DIAGNOSIS — H4312 Vitreous hemorrhage, left eye: Secondary | ICD-10-CM

## 2018-09-18 DIAGNOSIS — H3342 Traction detachment of retina, left eye: Secondary | ICD-10-CM

## 2018-09-18 DIAGNOSIS — E103513 Type 1 diabetes mellitus with proliferative diabetic retinopathy with macular edema, bilateral: Secondary | ICD-10-CM

## 2018-09-18 DIAGNOSIS — H35033 Hypertensive retinopathy, bilateral: Secondary | ICD-10-CM

## 2018-09-18 DIAGNOSIS — I10 Essential (primary) hypertension: Secondary | ICD-10-CM

## 2018-09-18 DIAGNOSIS — H25813 Combined forms of age-related cataract, bilateral: Secondary | ICD-10-CM

## 2018-10-19 ENCOUNTER — Other Ambulatory Visit: Payer: Self-pay

## 2018-10-19 ENCOUNTER — Encounter: Payer: Self-pay | Admitting: Dietician

## 2018-10-19 ENCOUNTER — Encounter: Payer: BC Managed Care – PPO | Attending: Endocrinology | Admitting: Dietician

## 2018-10-19 DIAGNOSIS — E10319 Type 1 diabetes mellitus with unspecified diabetic retinopathy without macular edema: Secondary | ICD-10-CM

## 2018-10-19 NOTE — Patient Instructions (Signed)
Listen to your body.   Eat regularly scheduled meals. Eat slowly. Stop eating when you are satisfied. Think about calorie density of foods.  Fat has a lot of calories, vegetables are very low calories. Bake, Broil, Grill rather than [place] and remove the skin from [place] and chicken. Great job on starting exercise.  Consider YouTube exercise videos  Consider practicing carbohydrate counting. Consider Calorie [employer].  Aim for 3 Carb Choices per meal (45 grams) +/- 1 either way  Aim for 0-1 Carbs per snack if hungry  Include protein in moderation with your meals and snacks Consider reading food labels for Total Carbohydrate of foods Continue checking BG at alternate times per day  Continue taking medication as directed by MD

## 2018-10-19 NOTE — Progress Notes (Addendum)
[facility] Note  10/23/2018     CHIEF COMPLAINT Patient presents for Retina Follow Up   HISTORY OF PRESENT ILLNESS: Barbara Donovan is a 28 y.o. female who presents to the clinic today for:   HPI    Retina Follow Up    Patient presents with  Diabetic Retinopathy.  In both eyes.  This started weeks ago.  Severity is moderate.  Duration of weeks.  Since onset it is stable.  I, the attending physician,  performed the HPI with the patient and updated documentation appropriately.          Comments    BS this afternoon: 253 Last A1c: 9.7 Patient states her vision is about the same OU.  She denies eye pain or discomfort and denies any new or worsening floaters or fol OU.       Last edited by Bernarda Caffey, MD on 10/23/2018  8:26 PM. (History)    pt her vision is about the same, she states she is using brimonidine once a day in the left eye  Referring physician: Shawnie Dapper, DO [place],  [place] [address]  HISTORICAL INFORMATION:   Selected notes from the MEDICAL RECORD NUMBER Referred by Dr. [name] for concern of bilateral CRVO LEE-  Ocular Hx-  PMH-     CURRENT MEDICATIONS: Current Outpatient Medications (Ophthalmic Drugs)  Medication Sig  . bacitracin-polymyxin b (POLYSPORIN) ophthalmic ointment Place 1 application into the left eye 4 (four) times daily. apply to eye every 12 hours while awake  . brimonidine (ALPHAGAN) 0.2 % ophthalmic solution Place 1 drop into the left eye 2 (two) times daily.  . prednisoLONE acetate (PRED FORTE) 1 % ophthalmic suspension Place 1 drop into the left eye 4 (four) times daily.   No current facility-administered medications for this visit.  (Ophthalmic Drugs)   Current Outpatient Medications (Other)  Medication Sig  . Blood Pressure Monitoring (OMRON 3 SERIES BP MONITOR) DEVI 1 each by Does not apply route daily. Take daily blood pressure. Please report back to Dr. Radford Pax in 2  wks.  . carvedilol (COREG) 12.5 MG tablet Take 1 tablet (12.5 mg total) by mouth 2 (two) times daily.  . insulin aspart (NOVOLOG) 100 UNIT/ML injection For use in pump, total of 60 units per day.  . Insulin Pen Needle (PEN NEEDLES) 32G X 5 MM MISC 1 Device by Does not apply route 4 (four) times daily.  . metroNIDAZOLE (FLAGYL) 500 MG tablet Take 1 tablet (500 mg total) by mouth 2 (two) times daily. (Patient not taking: Reported on 10/19/2018)  . Multiple Vitamin (MULTIVITAMIN WITH MINERALS) TABS tablet Take 1 tablet by mouth daily.  [name] VERIO test strip USED TO CHECK BLOOD SUGARS FIVE TIMES DAILY.   No current facility-administered medications for this visit.  (Other)      REVIEW OF SYSTEMS: ROS    Positive for: Genitourinary, Endocrine, Eyes, Respiratory   Negative for: Constitutional, Gastrointestinal, Skin, Musculoskeletal, HENT, Cardiovascular, Psychiatric, Allergic/Imm, Heme/Lymph   Last edited by Doneen Poisson on 10/23/2018  2:37 PM. (History)       ALLERGIES No Known Allergies  PAST MEDICAL HISTORY Past Medical History:  Diagnosis Date  . Asthma 10/30/2013  . Cataract    NS OU  . Chronic hypertension during pregnancy, antepartum 08/19/2017  . Dehydration 01/28/2018  . Depression during pregnancy, antepartum 07/07/2017   6/20: Short trial of zoloft previously, reports didn't help much  but also didn't give it a chance Discussed r/b/a SSRIs in pregnancy, agrees to try Zoloft again, rx sent No SI/HI/red flags  . Diabetes ([place])    TYPE I  . Diabetic retinopathy ([place]) 06/09/2017   07/2017 with bilateral severe diabetic non-proliferative retinopathy with macular edema.  Marland Kitchen HTN (hypertension)   . Hypertensive retinopathy    OU  . Hypokalemia 01/22/2018  . Hypomagnesemia 01/28/2018  . Intractable nausea and vomiting 01/22/2018  . Intrauterine growth restriction (IUGR) affecting care of mother 12/22/2017  . Morbid obesity ([place])   . 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  Proteinuria due to type 1 diabetes mellitus ([place]) 11/02/2017   Baseline Pr: Cr 10.23  . Severe hyperemesis gravidarum 10/30/2017  . Type I diabetes mellitus ([place]) 07/07/2017   Current Diabetic Medications:  Insulin  [x]  Aspirin 81 mg daily after 12 weeks (? A2/B GDM)  Required Referrals for A1GDM or A2GDM: [x]  Diabetes Education and Testing Supplies [x]  Nutrition Cousult  For A2/B GDM or higher classes of DM [x]  Diabetes Education and Testing Supplies [x]  Nutrition Counsult [x]  Fetal ECHO after 20 weeks  [x]  Eye exam for retina evaluation - severe retinopathy 7/19  Base  . Ventricular septal defect (VSD) of fetus in singleton pregnancy, antepartum 09/30/2017   May go to newborn nursery per Dr. Lenard Simmer Echo prior to discharge   Past Surgical History:  Procedure Laterality Date  . [place] VITRECTOMY WITH 20 GAUGE MVR PORT FOR MACULAR HOLE Left 07/20/2018   Procedure: 25 GAUGE PARS PLANA VITRECTOMY LEFT EYE;  Surgeon: Bernarda Caffey, MD;  Location: [facility];  Service: Ophthalmology;  Laterality: Left;  . CESAREAN SECTION N/A 12/26/2017   Procedure: CESAREAN SECTION;  Surgeon: Osborne Oman, MD;  Location: [facility];  Service: Obstetrics;  Laterality: N/A;  . EYE EXAMINATION UNDER ANESTHESIA Right 07/20/2018   Procedure: Eye Exam Under Anesthesia RIGHT EYE;  Surgeon: Bernarda Caffey, MD;  Location: [facility];  Service: Ophthalmology;  Laterality: Right;  . EYE SURGERY    . INJECTION OF SILICONE OIL Left 99991111   Procedure: Injection Of Silicone Oil LEFT EYE;  Surgeon: Bernarda Caffey, MD;  Location: [facility];  Service: Ophthalmology;  Laterality: Left;  . LASER PHOTO ABLATION Right 07/20/2018   Procedure: Laser Photo Ablation RIGHT EYE;  Surgeon: Bernarda Caffey, MD;  Location: [facility];  Service: Ophthalmology;  Laterality: Right;  . MEMBRANE PEEL Left 07/20/2018   Procedure: Membrane Peel LEFT EYE;  Surgeon: Bernarda Caffey, MD;  Location: [facility];  Service: Ophthalmology;  Laterality: Left;  . MITOMYCIN C  APPLICATION Bilateral [id number]   Procedure: Avastin Application;  Surgeon: Bernarda Caffey, MD;  Location: [facility];  Service: Ophthalmology;  Laterality: Bilateral;  . NO PAST SURGERIES    . PHOTOCOAGULATION WITH LASER Left 07/20/2018   Procedure: Photocoagulation With Laser LEFT EYE;  Surgeon: Bernarda Caffey, MD;  Location: [facility];  Service: Ophthalmology;  Laterality: Left;  . RETINAL DETACHMENT SURGERY Left 07/20/2018   Dr. Bernarda Caffey  . WISDOM TOOTH EXTRACTION      FAMILY HISTORY Family History  Problem Relation Age of Onset  . Diabetes Mother   . Aneurysm Mother 10  . Diabetes Father   . Cataracts Father   . Healthy Sister   . Healthy Brother   . Healthy Daughter   . Amblyopia Neg Hx   . Blindness Neg Hx   . Glaucoma Neg Hx   . Macular degeneration Neg Hx   . Retinal detachment Neg  Hx   . Strabismus Neg Hx   . Retinitis pigmentosa Neg Hx     SOCIAL HISTORY Social History   Tobacco Use  . Smoking status: Never Smoker  . Smokeless tobacco: Never Used  Substance Use Topics  . Alcohol use: Not Currently    Comment: SOCIALLY  . Drug use: Never         OPHTHALMIC EXAM:  Base Eye Exam    Visual Acuity (Snellen - Linear)      Right Left   Dist cc 20/25 -1 CF @ 4'   Dist ph cc 20/20 -2 20/150 -2   Correction: Glasses       Tonometry (Tonopen, 2:42 PM)      Right Left   Pressure 20 28       Tonometry #2 (Tonopen, 3:25 PM)      Right Left   Pressure 21 30       Pupils      Dark Light Shape React APD   Right 4 3 Round Brisk 0   Left 5 4 Round Brisk 0       Extraocular Movement      Right Left    Full Full       Neuro/Psych    Oriented x3: Yes   Mood/Affect: Normal       Dilation    Both eyes: 1.0% Mydriacyl, 2.5% Phenylephrine @ 2:42 PM        Slit Lamp and Fundus Exam    Slit Lamp Exam      Right Left   Lids/Lashes Normal Mild periorbital edema   Conjunctiva/Sclera White and quiet 2+ injection ST , sutures dissolving   Cornea 1+ Punctate  epithelial erosions mild endo pigment inferiorly   Anterior Chamber Deep and quiet Deep and quiet   Iris Round, moderate dilation, No NVI Round and dilated to 27mm, No NVI   Lens trace Nuclear sclerosis and cortical changes 1+Nuclear sclerosis, 1+cortical changes, 3-4+PSC   Vitreous trace pigment in anterior vitreous, VH settled inferiorly post vitrectomy, good oil fill       Fundus Exam      Right Left   Disc Pink and Sharp Pink and sharp   C/D Ratio 0.3 0.2   Macula Flat, Blunted foveal reflex, scattered MA/IRH/exudate - improved, +ERM flat under oil, improving edema, mild IRH   Vessels Vascular attenuation, Tortuous, regressing fibrotic NV Attenuated, perivascular fibrosis IT arcades   Periphery Attached, scattered IRH/DBH, nasal subhyaloid heme just off disc - improving, good 360 PRP attached under oil; peripapillary fibrosis regressing under oil        Refraction    Wearing Rx      Sphere Cylinder Axis   Right -1.75 +0.25 012   Left -1.75 +0.50 002          IMAGING AND PROCEDURES  Imaging and Procedures for @TODAY @  OCT, Retina - OU - Both Eyes       Right Eye Quality was borderline. Central Foveal Thickness: 226. Progression has been stable. Findings include no SRF, intraretinal hyper-reflective material, no IRF, normal foveal contour, vitreous traction, macular pucker, epiretinal membrane, preretinal fibrosis.   Left Eye Quality was borderline. Central Foveal Thickness: 324. Progression has improved. Findings include abnormal foveal contour, preretinal fibrosis, epiretinal membrane, intraretinal hyper-reflective material, intraretinal fluid, no SRF, outer retinal atrophy (Attached under oil; Interval improvement in IRF and retinal edema).   Notes *Images captured and stored on drive  Diagnosis / Impression:  PDR OU OD:  mild preretinal fibrosis/ERM/pucker OS: retina attached under oil; Interval improvement in central IRF and retinal edema   Clinical management:   See below  Abbreviations: NFP - Normal foveal profile. CME - cystoid macular edema. PED - pigment epithelial detachment. IRF - intraretinal fluid. SRF - subretinal fluid. EZ - ellipsoid zone. ERM - epiretinal membrane. ORA - outer retinal atrophy. ORT - outer retinal tubulation. SRHM - subretinal hyper-reflective material                  ASSESSMENT/PLAN:    ICD-10-CM   1. 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 Traction detachment of left retina  H33.42   3. Vitreous hemorrhage of left eye (HCC)  H43.12   4. Retinal edema  H35.81 OCT, Retina - OU - Both Eyes  5. Essential hypertension  I10   6. Hypertensive retinopathy of both eyes  H35.033   7. Combined forms of age-related cataract of both eyes  H25.813     1-4. 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 (OS > OD)  - s/p PRP OS (11.26.19)  - s/p PRP OD (06.01.20)  - lost to f/u following 11.26.19 appt due to complicated pregnancy, premature birth of baby w/ extended NICU stay and congenital heart defect, and post-partum health issues  - history of vision loss OS since December 2019  - OS with chronic VH and TRD, likely present since Dec 2019  - s/p IVA OS #1 (06.23.20), #2 (07.02.20)  - Now POM3 s/p PPV/MP/EL/silicon oil + IVA OS for VH and TRD from DM1, 07.02.20             - doing well, vision improving             - retina attached under silicon oil -- improved heme, fibrosis and edema             - IOP 28 OS - restart Cosopt BID OS  - increase Brimonidine to BID OS             - completed PF taper              - avoid laying flat on back              - post op drop and positioning instructions reviewed              - tylenol/ibuprofen for pain  - s/p fill in PRP and IVA OD 07.02.2020 in operating room  - f/u 2-3 weeks -- IOP check   5,6. Hypertensive retinopathy OU  - there was concern for pregnancy related hypertension (I.e.  Pre-eclampsia, Eclampsia, HELLP, etc) during pregnancy  - also during pregnancy (7.17.2019), had bilateral CRVO w/ CME (OD > OS) s/p PRP OS  - discussed importance of tight BP control  - monitor  7. 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  - discussed diagnosis and likely progression of cataract OS following PPV w/ silicon oil  - now visually significant PSC OS  - clear from a retina standpoint for cataract removal and IOP implantation  - will refer to Dr. Eulas Post for cataract consult -- calcs and IOL selection will need to be made anticipating planned silicon oil removal in the future     Ophthalmic Meds Ordered this visit:  No orders of the defined types were  placed in this encounter.     Return in about 2 weeks (around 11/06/2018) for f/u IOP OS.  There are no Patient Instructions on file for this visit.   Explained the diagnoses, plan, and follow up with the patient and they expressed understanding.  Patient expressed understanding of the importance of proper follow up care.  This document serves as a record of services personally performed by Gardiner Sleeper, MD, PhD. It was created on their behalf by Roselee Nova, COMT. The creation of this record is the provider's dictation and/or activities during the visit.  Electronically signed by: Leeann Must, COA 10/19/2018 8:04AM   Gardiner Sleeper, M.D., Ph.D. Diseases & Surgery of the Retina and Vitreous [facility]  I have reviewed the above documentation for accuracy and completeness, and I agree with the above. Gardiner Sleeper, M.D., Ph.D. 09/18/18 8:35 PM    Abbreviations: M myopia (nearsighted); A astigmatism; H hyperopia (farsighted); P presbyopia; Mrx spectacle prescription;  CTL contact lenses; OD right eye; OS left eye; OU both eyes  XT exotropia; ET esotropia; PEK punctate epithelial keratitis; PEE punctate  epithelial erosions; DES dry eye syndrome; MGD meibomian gland dysfunction; ATs artificial tears; PFAT's preservative free artificial tears; [place] nuclear sclerotic cataract; PSC posterior subcapsular cataract; ERM epi-retinal membrane; PVD posterior vitreous detachment; RD retinal detachment; DM diabetes mellitus; DR diabetic retinopathy; NPDR non-proliferative diabetic retinopathy; PDR proliferative diabetic retinopathy; CSME clinically significant macular edema; DME diabetic macular edema; dbh dot blot hemorrhages; CWS cotton wool spot; POAG primary open angle glaucoma; C/D cup-to-disc ratio; HVF humphrey visual field; GVF goldmann visual field; OCT optical coherence tomography; IOP intraocular pressure; BRVO Branch retinal vein occlusion; CRVO central retinal vein occlusion; CRAO central retinal artery occlusion; BRAO branch retinal artery occlusion; RT retinal tear; SB scleral buckle; PPV pars plana vitrectomy; VH Vitreous hemorrhage; PRP panretinal laser photocoagulation; IVK intravitreal kenalog; VMT vitreomacular traction; MH Macular hole;  NVD neovascularization of the disc; NVE neovascularization elsewhere; AREDS age related eye disease study; ARMD age related macular degeneration; POAG primary open angle glaucoma; EBMD epithelial/anterior basement membrane dystrophy; ACIOL anterior chamber intraocular lens; IOL intraocular lens; PCIOL posterior chamber intraocular lens; Phaco/IOL phacoemulsification with intraocular lens placement; [place] photorefractive keratectomy; LASIK laser assisted in situ keratomileusis; HTN hypertension; DM diabetes mellitus; COPD chronic obstructive pulmonary disease
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
## 2018-10-23 ENCOUNTER — Ambulatory Visit (INDEPENDENT_AMBULATORY_CARE_PROVIDER_SITE_OTHER): Payer: BC Managed Care – PPO | Admitting: Ophthalmology

## 2018-10-23 ENCOUNTER — Other Ambulatory Visit: Payer: Self-pay

## 2018-10-23 ENCOUNTER — Encounter (INDEPENDENT_AMBULATORY_CARE_PROVIDER_SITE_OTHER): Payer: Self-pay | Admitting: Ophthalmology

## 2018-10-23 DIAGNOSIS — H25813 Combined forms of age-related cataract, bilateral: Secondary | ICD-10-CM

## 2018-10-23 DIAGNOSIS — I10 Essential (primary) hypertension: Secondary | ICD-10-CM

## 2018-10-23 DIAGNOSIS — H3342 Traction detachment of retina, left eye: Secondary | ICD-10-CM

## 2018-10-23 DIAGNOSIS — H4312 Vitreous hemorrhage, left eye: Secondary | ICD-10-CM

## 2018-10-23 DIAGNOSIS — H3581 Retinal edema: Secondary | ICD-10-CM | POA: Diagnosis not present

## 2018-10-23 DIAGNOSIS — H35033 Hypertensive retinopathy, bilateral: Secondary | ICD-10-CM

## 2018-10-23 DIAGNOSIS — E103513 Type 1 diabetes mellitus with proliferative diabetic retinopathy with macular edema, bilateral: Secondary | ICD-10-CM

## 2018-11-03 ENCOUNTER — Encounter: Payer: Self-pay | Admitting: Endocrinology

## 2018-11-03 NOTE — Telephone Encounter (Signed)
Please advise 

## 2018-11-06 ENCOUNTER — Other Ambulatory Visit: Payer: Self-pay

## 2018-11-06 ENCOUNTER — Ambulatory Visit (INDEPENDENT_AMBULATORY_CARE_PROVIDER_SITE_OTHER): Payer: BC Managed Care – PPO | Admitting: Emergency Medicine

## 2018-11-06 DIAGNOSIS — Z3042 Encounter for surveillance of injectable contraceptive: Secondary | ICD-10-CM | POA: Diagnosis not present

## 2018-11-06 DIAGNOSIS — E109 Type 1 diabetes mellitus without complications: Secondary | ICD-10-CM

## 2018-11-06 MED ORDER — MEDROXYPROGESTERONE ACETATE 150 MG/ML IM SUSP
150.0000 mg | Freq: Once | INTRAMUSCULAR | Status: AC
  Administered 2018-11-06: 150 mg via INTRAMUSCULAR

## 2018-11-06 MED ORDER — ONETOUCH VERIO VI STRP: 1.0000 | ORAL_STRIP | Freq: Every day | 2 refills | Status: DC

## 2018-11-06 NOTE — Progress Notes (Signed)
Patient seen and assessed by nursing staff during this encounter. I have reviewed the chart and agree with the documentation and plan.  Verita Schneiders, MD 11/06/2018 3:42 PM

## 2018-11-06 NOTE — Progress Notes (Signed)
[facility] Note  11/09/2018     CHIEF COMPLAINT Patient presents for Retina Follow Up   HISTORY OF PRESENT ILLNESS: Barbara Donovan is a 28 y.o. female who presents to the clinic today for:   HPI    Retina Follow Up    Patient presents with  Diabetic Retinopathy.  In left eye.  Severity is severe.  Duration of 2 weeks.  Since onset it is gradually improving.  I, the attending physician,  performed the HPI with the patient and updated documentation appropriately.          Comments    2 Week follow up for IOP os. Patient states she is using Brimonidine bid os and Dark blue top bid os. Patient A1C 9.1       Last edited by Bernarda Caffey, MD on 11/09/2018 10:33 AM. (History)    pt her vision is about the same, she states she is using brimonidine once a day in the left eye  Referring physician: Shawnie Dapper, DO [place],  [place] [id number]  HISTORICAL INFORMATION:   Selected notes from the MEDICAL RECORD NUMBER Referred by Dr. [name] for concern of bilateral CRVO LEE-  Ocular Hx-  PMH-     CURRENT MEDICATIONS: Current Outpatient Medications (Ophthalmic Drugs)  Medication Sig  . brimonidine (ALPHAGAN) 0.2 % ophthalmic solution Place 1 drop into the left eye 2 (two) times daily.   No current facility-administered medications for this visit.  (Ophthalmic Drugs)   Current Outpatient Medications (Other)  Medication Sig  . Blood Pressure Monitoring (OMRON 3 SERIES BP MONITOR) DEVI 1 each by Does not apply route daily. Take daily blood pressure. Please report back to Dr. Radford Pax in 2 wks.  . carvedilol (COREG) 25 MG tablet Take 1 tablet (25 mg total) by mouth 2 (two) times daily.  . Continuous Blood Gluc Sensor (DEXCOM G6 SENSOR) MISC 1 each by Does not apply route See admin instructions. For use with continuous glucose monitoring system. Change sensor every 10 days; E11.9  . Continuous Blood Gluc Transmit (DEXCOM G6  TRANSMITTER) MISC 1 each by Does not apply route See admin instructions. For continuous glucose monitoring; E11.9  . furosemide (LASIX) 20 MG tablet Take 1 tablet (20 mg total) by mouth daily as needed (swelling).  Marland Kitchen glucose blood (ONETOUCH VERIO) test strip 1 each by Other route 5 (five) times daily. Use as instructed  . insulin aspart (NOVOLOG) 100 UNIT/ML injection For use in pump, total of 60 units per day.  . Insulin Pen Needle (PEN NEEDLES) 32G X 5 MM MISC 1 Device by Does not apply route 4 (four) times daily.  . Multiple Vitamin (MULTIVITAMIN WITH MINERALS) TABS tablet Take 1 tablet by mouth daily.   No current facility-administered medications for this visit.  (Other)      REVIEW OF SYSTEMS: ROS    Positive for: Genitourinary, Endocrine, Eyes, Respiratory   Negative for: Constitutional, Gastrointestinal, Skin, Musculoskeletal, HENT, Cardiovascular, Psychiatric, Allergic/Imm, Heme/Lymph   Last edited by Elmore Guise, COT on 11/09/2018  9:08 AM. (History)       ALLERGIES No Known Allergies  PAST MEDICAL HISTORY Past Medical History:  Diagnosis Date  . Asthma 10/30/2013  . Cataract    NS OU  . Chronic hypertension during pregnancy, antepartum 08/19/2017  . Dehydration 01/28/2018  . Depression during pregnancy, antepartum 07/07/2017   6/20: Short trial of zoloft previously, reports didn't help much but  also didn't give it a chance Discussed r/b/a SSRIs in pregnancy, agrees to try Zoloft again, rx sent No SI/HI/red flags  . Diabetes ([place])    TYPE I  . Diabetic retinopathy ([place]) 06/09/2017   07/2017 with bilateral severe diabetic non-proliferative retinopathy with macular edema.  Marland Kitchen HTN (hypertension)   . Hypertensive retinopathy    OU  . Hypokalemia 01/22/2018  . Hypomagnesemia 01/28/2018  . Intractable nausea and vomiting 01/22/2018  . Intrauterine growth restriction (IUGR) affecting care of mother 12/22/2017  . Morbid obesity ([place])   . 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 Proteinuria due to type 1 diabetes mellitus ([place]) 11/02/2017   Baseline Pr: Cr 10.23  . Severe hyperemesis gravidarum 10/30/2017  . Type I diabetes mellitus ([place]) 07/07/2017   Current Diabetic Medications:  Insulin  [x]  Aspirin 81 mg daily after 12 weeks (? A2/B GDM)  Required Referrals for A1GDM or A2GDM: [x]  Diabetes Education and Testing Supplies [x]  Nutrition Cousult  For A2/B GDM or higher classes of DM [x]  Diabetes Education and Testing Supplies [x]  Nutrition Counsult [x]  Fetal ECHO after 20 weeks  [x]  Eye exam for retina evaluation - severe retinopathy 7/19  Base  . Ventricular septal defect (VSD) of fetus in singleton pregnancy, antepartum 09/30/2017   May go to newborn nursery per Dr. Lenard Simmer Echo prior to discharge   Past Surgical History:  Procedure Laterality Date  . [place] VITRECTOMY WITH 20 GAUGE MVR PORT FOR MACULAR HOLE Left 07/20/2018   Procedure: 25 GAUGE PARS PLANA VITRECTOMY LEFT EYE;  Surgeon: Bernarda Caffey, MD;  Location: [facility];  Service: Ophthalmology;  Laterality: Left;  . CESAREAN SECTION N/A 12/26/2017   Procedure: CESAREAN SECTION;  Surgeon: Osborne Oman, MD;  Location: [facility];  Service: Obstetrics;  Laterality: N/A;  . EYE EXAMINATION UNDER ANESTHESIA Right 07/20/2018   Procedure: Eye Exam Under Anesthesia RIGHT EYE;  Surgeon: Bernarda Caffey, MD;  Location: [facility];  Service: Ophthalmology;  Laterality: Right;  . EYE SURGERY    . INJECTION OF SILICONE OIL Left 99991111   Procedure: Injection Of Silicone Oil LEFT EYE;  Surgeon: Bernarda Caffey, MD;  Location: [facility];  Service: Ophthalmology;  Laterality: Left;  . LASER PHOTO ABLATION Right 07/20/2018   Procedure: Laser Photo Ablation RIGHT EYE;  Surgeon: Bernarda Caffey, MD;  Location: [facility];  Service: Ophthalmology;  Laterality: Right;  . MEMBRANE PEEL Left 07/20/2018   Procedure: Membrane Peel LEFT EYE;  Surgeon: Bernarda Caffey, MD;  Location: [facility];  Service: Ophthalmology;  Laterality: Left;  .  MITOMYCIN C APPLICATION Bilateral 99991111   Procedure: Avastin Application;  Surgeon: Bernarda Caffey, MD;  Location: [facility];  Service: Ophthalmology;  Laterality: Bilateral;  . NO PAST SURGERIES    . PHOTOCOAGULATION WITH LASER Left 07/20/2018   Procedure: Photocoagulation With Laser LEFT EYE;  Surgeon: Bernarda Caffey, MD;  Location: [facility];  Service: Ophthalmology;  Laterality: Left;  . RETINAL DETACHMENT SURGERY Left 07/20/2018   Dr. Bernarda Caffey  . WISDOM TOOTH EXTRACTION      FAMILY HISTORY Family History  Problem Relation Age of Onset  . Diabetes Mother   . Aneurysm Mother 48  . Diabetes Father   . Cataracts Father   . Healthy Sister   . Healthy Brother   . Healthy Daughter   . Amblyopia Neg Hx   . Blindness Neg Hx   . Glaucoma Neg Hx   . Macular degeneration Neg Hx   . Retinal detachment Neg Hx   .  Strabismus Neg Hx   . Retinitis pigmentosa Neg Hx     SOCIAL HISTORY Social History   Tobacco Use  . Smoking status: Never Smoker  . Smokeless tobacco: Never Used  Substance Use Topics  . Alcohol use: Not Currently    Comment: SOCIALLY  . Drug use: Never         OPHTHALMIC EXAM:  Base Eye Exam    Visual Acuity (Snellen - Linear)      Right Left   Dist cc 20/25 20/CF   Dist ph cc 20/20-2 20/400   Correction: Glasses       Tonometry (Tonopen, 9:10 AM)      Right Left   Pressure 22 23       Pupils      Dark Light Shape React APD   Right 4 3 Round Brisk None   Left 5 4 Round Brisk None       Visual Fields      Left Right    Full Full       Extraocular Movement      Right Left    Full, Ortho Full, Ortho       Neuro/Psych    Oriented x3: Yes   Mood/Affect: Normal       Dilation    Both eyes: 1.0% Mydriacyl, 2.5% Phenylephrine @ 9:10 AM        Slit Lamp and Fundus Exam    Slit Lamp Exam      Right Left   Lids/Lashes Normal Mild periorbital edema   Conjunctiva/Sclera White and quiet 2+ injection ST , sutures dissolving   Cornea 1+ Punctate  epithelial erosions mild endo pigment inferiorly   Anterior Chamber Deep and quiet Deep and quiet   Iris Round, moderate dilation, No NVI Round and dilated to 5.62mm, No NVI   Lens trace Nuclear sclerosis and cortical changes 1+Nuclear sclerosis, 1+cortical changes, 3-4+PSC   Vitreous trace pigment in anterior vitreous, VH settled inferiorly post vitrectomy, good oil fill       Fundus Exam      Right Left   Disc Pink and Sharp Pink and sharp   C/D Ratio 0.3 0.2   Macula Flat, Blunted foveal reflex, scattered MA/IRH/exudate - improved, +ERM flat under oil, improving edema, mild IRH, scattered DBH   Vessels Vascular attenuation, Tortuous, regressing fibrotic NV Attenuated, perivascular fibrosis IT arcades   Periphery Attached, scattered IRH/DBH, nasal subhyaloid heme just off disc - improving, good 360 PRP attached under oil; peripapillary fibrosis regressing under oil, good 360 PRP.        Refraction    Wearing Rx      Sphere Cylinder Axis   Right -1.75 +0.25 012   Left -1.75 +0.50 002          IMAGING AND PROCEDURES  Imaging and Procedures for @TODAY @  OCT, Retina - OU - Both Eyes       Right Eye Quality was borderline. Central Foveal Thickness: 222. Progression has been stable. Findings include no SRF, intraretinal hyper-reflective material, no IRF, normal foveal contour, vitreous traction, macular pucker, epiretinal membrane, preretinal fibrosis.   Left Eye Quality was borderline. Central Foveal Thickness: 340. Progression has improved. Findings include abnormal foveal contour, epiretinal membrane, no SRF, no IRF, outer retinal atrophy (Attached under oil; Interval improvement in IRF and foveal profile).   Notes *Images captured and stored on drive  Diagnosis / Impression:  PDR OU OD: mild preretinal fibrosis/ERM/pucker OS: retina attached under oil; Interval improvement  in central IRF and retinal edema   Clinical management:  See below  Abbreviations: NFP -  Normal foveal profile. CME - cystoid macular edema. PED - pigment epithelial detachment. IRF - intraretinal fluid. SRF - subretinal fluid. EZ - ellipsoid zone. ERM - epiretinal membrane. ORA - outer retinal atrophy. ORT - outer retinal tubulation. SRHM - subretinal hyper-reflective material                  ASSESSMENT/PLAN:    ICD-10-CM   1. 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 Traction detachment of left retina  H33.42   3. Vitreous hemorrhage of left eye (HCC)  H43.12   4. Retinal edema  H35.81 OCT, Retina - OU - Both Eyes  5. Essential hypertension  I10   6. Hypertensive retinopathy of both eyes  H35.033   7. Combined forms of age-related cataract of both eyes  H25.813     1-4. 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 (OS > OD)  - s/p PRP OS (11.26.19)  - s/p PRP OD (06.01.20)  - lost to f/u following 11.26.19 appt due to complicated pregnancy, premature birth of baby w/ extended NICU stay and congenital heart defect, and post-partum health issues  - history of vision loss OS since December 2019  - OS with chronic VH and TRD, likely present since Dec 2019  - s/p IVA OS #1 (06.23.20), #2 (07.02.20)  - Now POM3 s/p PPV/MP/EL/silicon oil + IVA OS for VH and TRD from DM1, 07.02.20             - doing well             - retina attached under silicon oil -- improved heme, fibrosis and edema             - IOP 23 OS - improved from prior, but still elevated  - cont Cosopt TID OS  - cont Brimonidine to TID OS             - avoid laying flat on back              - post op drop and positioning instructions reviewed              - tylenol/ibuprofen for pain  - s/p fill in PRP and IVA OD 07.02.2020 in operating room  - f/u 4 weeks   5,6. Hypertensive retinopathy OU  - there was concern for pregnancy related hypertension (I.e. Pre-eclampsia, Eclampsia, HELLP, etc) during pregnancy  - also during  pregnancy (7.17.2019), had bilateral CRVO w/ CME (OD > OS) s/p PRP OS  - discussed importance of tight BP control  - monitor  7. 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  - discussed diagnosis and likely progression of cataract OS following PPV w/ silicon oil  - now visually significant PSC OS  - clear from a retina standpoint for cataract removal and IOP implantation  - Scheduled with Dr. Eulas Post for 11/25/2018 for cat consult -- calcs and IOL selection will need to be made anticipating planned silicon oil removal in the future     Ophthalmic Meds Ordered this visit:  No orders of the defined types were placed in this encounter.     Return in about 4 weeks (around 12/07/2018) for Dilated exam, OCT.  There are no  Patient Instructions on file for this visit.   Explained the diagnoses, plan, and follow up with the patient and they expressed understanding.  Patient expressed understanding of the importance of proper follow up care.  This document serves as a record of services personally performed by Gardiner Sleeper, MD, PhD. It was created on their behalf by Roselee Nova, COMT. The creation of this record is the provider's dictation and/or activities during the visit.  Electronically signed by: Roselee Nova, COMT 11/10/18 11:47 PM   Gardiner Sleeper, M.D., Ph.D. Diseases & Surgery of the Retina and [place] 11/10/18   I have reviewed the above documentation for accuracy and completeness, and I agree with the above. Gardiner Sleeper, M.D., Ph.D. 11/10/18 11:47 PM    Abbreviations: M myopia (nearsighted); A astigmatism; H hyperopia (farsighted); P presbyopia; Mrx spectacle prescription;  CTL contact lenses; OD right eye; OS left eye; OU both eyes  XT exotropia; ET esotropia; PEK punctate epithelial keratitis; PEE punctate epithelial erosions; DES dry eye  syndrome; MGD meibomian gland dysfunction; ATs artificial tears; PFAT's preservative free artificial tears; [place] nuclear sclerotic cataract; PSC posterior subcapsular cataract; ERM epi-retinal membrane; PVD posterior vitreous detachment; RD retinal detachment; DM diabetes mellitus; DR diabetic retinopathy; NPDR non-proliferative diabetic retinopathy; PDR proliferative diabetic retinopathy; CSME clinically significant macular edema; DME diabetic macular edema; dbh dot blot hemorrhages; CWS cotton wool spot; POAG primary open angle glaucoma; C/D cup-to-disc ratio; HVF humphrey visual field; GVF goldmann visual field; OCT optical coherence tomography; IOP intraocular pressure; BRVO Branch retinal vein occlusion; CRVO central retinal vein occlusion; CRAO central retinal artery occlusion; BRAO branch retinal artery occlusion; RT retinal tear; SB scleral buckle; PPV pars plana vitrectomy; VH Vitreous hemorrhage; PRP panretinal laser photocoagulation; IVK intravitreal kenalog; VMT vitreomacular traction; MH Macular hole;  NVD neovascularization of the disc; NVE neovascularization elsewhere; AREDS age related eye disease study; ARMD age related macular degeneration; POAG primary open angle glaucoma; EBMD epithelial/anterior basement membrane dystrophy; ACIOL anterior chamber intraocular lens; IOL intraocular lens; PCIOL posterior chamber intraocular lens; Phaco/IOL phacoemulsification with intraocular lens placement; [place] photorefractive keratectomy; LASIK laser assisted in situ keratomileusis; HTN hypertension; DM diabetes mellitus; COPD chronic obstructive pulmonary disease

## 2018-11-06 NOTE — Progress Notes (Signed)
Barbara Donovan here for Depo-Provera  Injection.  Injection administered without complication. Patient will return in 3 months for next injection.  [name], [place] 11/06/18   [id number]

## 2018-11-07 ENCOUNTER — Telehealth: Payer: Self-pay | Admitting: *Deleted

## 2018-11-07 ENCOUNTER — Ambulatory Visit (INDEPENDENT_AMBULATORY_CARE_PROVIDER_SITE_OTHER): Payer: BC Managed Care – PPO | Admitting: Endocrinology

## 2018-11-07 ENCOUNTER — Encounter: Payer: Self-pay | Admitting: Endocrinology

## 2018-11-07 VITALS — BP 152/80 | HR 96 | Ht 66.0 in | Wt 192.8 lb

## 2018-11-07 DIAGNOSIS — E103599 Type 1 diabetes mellitus with proliferative diabetic retinopathy without macular edema, unspecified eye: Secondary | ICD-10-CM

## 2018-11-07 DIAGNOSIS — I10 Essential (primary) hypertension: Secondary | ICD-10-CM | POA: Diagnosis not present

## 2018-11-07 DIAGNOSIS — Z9119 Patient's noncompliance with other medical treatment and regimen: Secondary | ICD-10-CM | POA: Diagnosis not present

## 2018-11-07 DIAGNOSIS — E109 Type 1 diabetes mellitus without complications: Secondary | ICD-10-CM | POA: Diagnosis not present

## 2018-11-07 LAB — POCT GLYCOSYLATED HEMOGLOBIN (HGB A1C): Hemoglobin A1C: 9.1 % — AB (ref 4.0–5.6)

## 2018-11-07 NOTE — Patient Instructions (Addendum)
Your blood pressure is high today.  Please see a primary care provider soon, to have it rechecked please take these pump settings: basal rate of 2.3 unit/hr, 7 AM-9 PM, and 0.7 units/hr overnight.   No meal bolus.  correction bolus (which some people call "sensitivity," or "insulin sensitivity ratio," or just "isr") of 1 unit for each 30 by which your glucose exceeds 100.   On this type of pump schedule, you should eat meals on a regular schedule.  If a meal is missed or significantly delayed, your blood sugar could go low.  check your blood sugar 5 times a day: before the 3 meals, and at bedtime.  also check if you have symptoms of your blood sugar being too high or too low.  please keep a record of the readings and bring it to your next appointment here (or you can bring the meter itself).  You can write it on any piece of paper.  please call us sooner if your blood sugar goes below 70, or if you have a lot of readings over 200.  Please come back for a follow-up appointment in 1 month.

## 2018-11-07 NOTE — Progress Notes (Signed)
Subjective:    Patient ID: Barbara Donovan, female    DOB: May 03, 1990, 28 y.o.   MRN: [id number]  HPI Pt returns for f/u of diabetes mellitus: DM type: 1 Dx'ed: 123456 Complications: PDR Therapy: insulin since 2009, and pump rx since 2020.  DKA: last episode was 2015.  Severe hypoglycemia: never Pancreatitis: never Pancreatic imaging: never.  Other: she takes multiple daily injections; she took pump rx during 2019 pregnancy, but not continuous glucose monitor, due to cost.  Interval history:  She takes these settings:  basal rate of 2.6 unit/hr, or 0.7 units/HR.  She changes back and forth, depending on the day. No meal bolus.  correction bolus (which some people call "sensitivity," or "insulin sensitivity ratio," or just "isr") of 1 unit for each 30 by which your glucose exceeds 100.   TDD is 47 units (79% basal, 18% bolus, and 3% correction bolus.   She had a "mental breakdown" at work last week.  She is better now. Meter is downloaded today, and the printout is scanned into the record.  Glucose varies from 98-495.  There is no trend throughout the day.   Past Medical History:  Diagnosis Date  . Asthma 10/30/2013  . Cataract    NS OU  . Chronic hypertension during pregnancy, antepartum 08/19/2017  . Dehydration 01/28/2018  . Depression during pregnancy, antepartum 07/07/2017   6/20: Short trial of zoloft previously, reports didn't help much but also didn't give it a chance Discussed r/b/a SSRIs in pregnancy, agrees to try Zoloft again, rx sent No SI/HI/red flags  . Diabetes ([place])    TYPE I  . 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  Marland Kitchen HTN (hypertension)   . Hypertensive retinopathy    OU  . Hypokalemia 01/22/2018  . Hypomagnesemia 01/28/2018  . Intractable nausea and vomiting 01/22/2018  . Intrauterine growth restriction (IUGR) affecting care of mother 12/22/2017  . Morbid obesity ([place])   .  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 Proteinuria due to type 1 diabetes mellitus ([place]) 11/02/2017   Baseline Pr: Cr 10.23  . Severe hyperemesis gravidarum 10/30/2017  . Type I diabetes mellitus ([place]) 07/07/2017   Current Diabetic Medications:  Insulin  [x]  Aspirin 81 mg daily after 12 weeks (? A2/B GDM)  Required Referrals for A1GDM or A2GDM: [x]  Diabetes Education and Testing Supplies [x]  Nutrition Cousult  For A2/B GDM or higher classes of DM [x]  Diabetes Education and Testing Supplies [x]  Nutrition Counsult [x]  Fetal ECHO after 20 weeks  [x]  Eye exam for retina evaluation - severe retinopathy 7/19  Base  . Ventricular septal defect (VSD) of fetus in singleton pregnancy, antepartum 09/30/2017   May go to newborn nursery per Dr. Lenard Simmer Echo prior to discharge    Past Surgical History:  Procedure Laterality Date  . [place] VITRECTOMY WITH 20 GAUGE MVR PORT FOR MACULAR HOLE Left 07/20/2018   Procedure: 25 GAUGE PARS PLANA VITRECTOMY LEFT EYE;  Surgeon: Bernarda Caffey, MD;  Location: [facility];  Service: Ophthalmology;  Laterality: Left;  . CESAREAN SECTION N/A 12/26/2017   Procedure: CESAREAN SECTION;  Surgeon: Osborne Oman, MD;  Location: [facility];  Service: Obstetrics;  Laterality: N/A;  . EYE EXAMINATION UNDER ANESTHESIA Right 07/20/2018   Procedure: Eye Exam Under Anesthesia RIGHT EYE;  Surgeon: Bernarda Caffey, MD;  Location: [facility];  Service: Ophthalmology;  Laterality: Right;  . EYE SURGERY    . INJECTION OF  SILICONE OIL Left 99991111   Procedure: Injection Of Silicone Oil LEFT EYE;  Surgeon: Bernarda Caffey, MD;  Location: [facility];  Service: Ophthalmology;  Laterality: Left;  . LASER PHOTO ABLATION Right 07/20/2018   Procedure: Laser Photo Ablation RIGHT EYE;  Surgeon: Bernarda Caffey, MD;  Location: [facility];  Service: Ophthalmology;  Laterality: Right;  . MEMBRANE PEEL Left 07/20/2018   Procedure: Membrane Peel LEFT EYE;  Surgeon: Bernarda Caffey, MD;  Location: [facility];  Service:  Ophthalmology;  Laterality: Left;  . MITOMYCIN C APPLICATION Bilateral 99991111   Procedure: Avastin Application;  Surgeon: Bernarda Caffey, MD;  Location: [facility];  Service: Ophthalmology;  Laterality: Bilateral;  . NO PAST SURGERIES    . PHOTOCOAGULATION WITH LASER Left 07/20/2018   Procedure: Photocoagulation With Laser LEFT EYE;  Surgeon: Bernarda Caffey, MD;  Location: [facility];  Service: Ophthalmology;  Laterality: Left;  . RETINAL DETACHMENT SURGERY Left 07/20/2018   Dr. Bernarda Caffey  . WISDOM TOOTH EXTRACTION      Social History   Socioeconomic History  . Marital status: Single    Spouse name: Not on file  . Number of children: Not on file  . Years of education: Not on file  . Highest education level: Not on file  Occupational History  . Not on file  Social Needs  . Financial resource strain: Not on file  . Food insecurity    Worry: Not on file    Inability: Not on file  . Transportation needs    Medical: Not on file    Non-medical: Not on file  Tobacco Use  . Smoking status: Never Smoker  . Smokeless tobacco: Never Used  Substance and Sexual Activity  . Alcohol use: Not Currently    Comment: SOCIALLY  . Drug use: Never  . Sexual activity: Not Currently    Birth control/protection: None  Lifestyle  . Physical activity    Days per week: Not on file    Minutes per session: Not on file  . Stress: Not on file  Relationships  . Social Herbalist on phone: Not on file    Gets together: Not on file    Attends religious service: Not on file    Active member of club or organization: Not on file    Attends meetings of clubs or organizations: Not on file    Relationship status: Not on file  . Intimate partner violence    Fear of current or ex partner: Not on file    Emotionally abused: Not on file    Physically abused: Not on file    Forced sexual activity: Not on file  Other Topics Concern  . Not on file  Social History Narrative  . Not on file    Current  Outpatient Medications on File Prior to Visit  Medication Sig Dispense Refill  . Blood Pressure Monitoring (OMRON 3 SERIES BP MONITOR) DEVI 1 each by Does not apply route daily. Take daily blood pressure. Please report back to Dr. Radford Pax in 2 wks. 1 Device 0  . brimonidine (ALPHAGAN) 0.2 % ophthalmic solution Place 1 drop into the left eye 2 (two) times daily.    Marland Kitchen glucose blood (ONETOUCH VERIO) test strip 1 each by Other route 5 (five) times daily. Use as instructed 200 each 2  . insulin aspart (NOVOLOG) 100 UNIT/ML injection For use in pump, total of 60 units per day. 20 mL 11  . Insulin Pen Needle (PEN NEEDLES) 32G X 5  MM MISC 1 Device by Does not apply route 4 (four) times daily. 120 each 11  . Multiple Vitamin (MULTIVITAMIN WITH MINERALS) TABS tablet Take 1 tablet by mouth daily.     No current facility-administered medications on file prior to visit.     No Known Allergies  Family History  Problem Relation Age of Onset  . Diabetes Mother   . Aneurysm Mother 59  . Diabetes Father   . Cataracts Father   . Healthy Sister   . Healthy Brother   . Healthy Daughter   . Amblyopia Neg Hx   . Blindness Neg Hx   . Glaucoma Neg Hx   . Macular degeneration Neg Hx   . Retinal detachment Neg Hx   . Strabismus Neg Hx   . Retinitis pigmentosa Neg Hx     BP (!) 152/80 (BP Location: Left Arm, Patient Position: Sitting, Cuff Size: Normal)   Pulse 96   Ht 5\' 6"  (1.676 m)   Wt 192 lb 12.8 oz (87.5 kg)   SpO2 98%   BMI 31.12 kg/m   Review of Systems She denies hypoglycemia    Objective:   Physical Exam VITAL SIGNS:  See vs page GENERAL: no distress Pulses: dorsalis pedis intact bilat.   MSK: no deformity of the feet CV: 2+ bilat leg edema Skin:  no ulcer on the feet.  normal color and temp on the feet. Neuro: sensation is intact to touch on the feet.   Lab Results  Component Value Date   HGBA1C 9.1 (A) 11/07/2018       Assessment & Plan:  Type 1 DM, with PDR: she needs  increased rx.  Noncompliance with pump settings. She is advised to take as rx'ed.   HTN: is noted today.   Patient Instructions  Your blood pressure is high today.  Please see a primary care provider soon, to have it rechecked please take these pump settings: basal rate of 2.3 unit/hr, 7 AM-9 PM, and 0.7 units/hr overnight.   No meal bolus.  correction bolus (which some people call "sensitivity," or "insulin sensitivity ratio," or just "isr") of 1 unit for each 30 by which your glucose exceeds 100.   On this type of pump schedule, you should eat meals on a regular schedule.  If a meal is missed or significantly delayed, your blood sugar could go low.  check your blood sugar 5 times a day: before the 3 meals, and at bedtime.  also check if you have symptoms of your blood sugar being too high or too low.  please keep a record of the readings and bring it to your next appointment here (or you can bring the meter itself).  You can write it on any piece of paper.  please call us sooner if your blood sugar goes below 70, or if you have a lot of readings over 200.  Please come back for a follow-up appointment in 1 month.

## 2018-11-07 NOTE — Telephone Encounter (Signed)
Virtual Visit Pre-Appointment Phone Call  , I am calling you today to discuss your upcoming appointment. We are currently trying to limit exposure to the virus that causes COVID-19 by seeing patients at home rather than in the office."  1. "What is the BEST phone number to call the day of the visit?" - include this in appointment notes  2. "Do you have or have access to (through a family member/friend) a smartphone with video capability that we can use for your visit?" a. If yes - list this number in appt notes as "cell" (if different from BEST phone #) and list the appointment type as a VIDEO visit in appointment notes b. If no - list the appointment type as a PHONE visit in appointment notes  3. Confirm consent - "In the setting of the current Covid19 crisis, you are scheduled for a (phone or video) visit with your provider on (date) at (time).  Just as we do with many in-office visits, in order for you to participate in this visit, we must obtain consent.  If you'd like, I can send this to your mychart (if signed up) or email for you to review.  Otherwise, I can obtain your verbal consent now.  All virtual visits are billed to your insurance company just like a normal visit would be.  By agreeing to a virtual visit, we'd like you to understand that the technology does not allow for your provider to perform an examination, and thus may limit your provider's ability to fully assess your condition. If your provider identifies any concerns that need to be evaluated in person, we will make arrangements to do so.  Finally, though the technology is pretty good, we cannot assure that it will always work on either your or our end, and in the setting of a video visit, we may have to convert it to a phone-only visit.  In either situation, we cannot ensure that we have a secure connection.  Are you willing to proceed?" STAFF: Did the patient verbally acknowledge consent to telehealth visit? Document YES/NO  here: YES   4. Advise patient to be prepared - "Two hours prior to your appointment, go ahead and check your blood pressure, pulse, oxygen saturation, and your weight (if you have the equipment to check those) and write them all down. When your visit starts, your provider will ask you for this information. If you have an Apple Watch or Kardia device, please plan to have heart rate information ready on the day of your appointment. Please have a pen and paper handy nearby the day of the visit as well."  5. Give patient instructions for MyChart download to smartphone OR Doximity/Doxy.me as below if video visit (depending on what platform provider is using)  6. Inform patient they will receive a phone call 15 minutes prior to their appointment time (may be from unknown caller ID) so they should be prepared to answer    TELEPHONE CALL NOTE  Barbara Donovan has been deemed a candidate for a follow-up tele-health visit to limit community exposure during the Covid-19 pandemic. I spoke with the patient via phone to ensure availability of phone/video source, confirm preferred email & phone number, and discuss instructions and expectations.  I reminded Barbara Donovan to be prepared with any vital sign and/or heart rhythm information that could potentially be obtained via home monitoring, at the time of her visit. I reminded Barbara Donovan to expect a phone call prior  to her visit.  Claude Manges, [place] 11/07/2018 9:15 AM   INSTRUCTIONS FOR DOWNLOADING THE MYCHART APP TO SMARTPHONE  - The patient must first make sure to have activated MyChart and know their login information - If Apple, go to [employer] and type in MyChart in the search bar and download the app. If Android, ask patient to go to [employer] and type in [place] in the search bar and download the app. The app is free but as with any other app downloads, their phone may require them to verify saved payment information or  Apple/Android password.  - The patient will need to then log into the app with their MyChart username and password, and select [facility] as their healthcare provider to link the account. When it is time for your visit, go to the MyChart app, find appointments, and click Begin Video Visit. Be sure to Select Allow for your device to access the Microphone and Camera for your visit. You will then be connected, and your provider will be with you shortly.  **If they have any issues connecting, or need assistance please contact MyChart service desk [phone])**  **If using a computer, in order to ensure the best quality for their visit they will need to use either of the following Internet Browsers: [employer], or Google Chrome**  IF USING DOXIMITY or DOXY.ME - The patient will receive a link just prior to their visit by text.     FULL LENGTH CONSENT FOR TELE-HEALTH VISIT   I hereby voluntarily request, consent and authorize [facility] and its employed or contracted physicians, physician assistants, nurse practitioners or other licensed health care professionals (the Practitioner), to provide me with telemedicine health care services (the "Services") as deemed necessary by the treating Practitioner. I acknowledge and consent to receive the Services by the Practitioner via telemedicine. I understand that the telemedicine visit will involve communicating with the Practitioner through live audiovisual communication technology and the disclosure of certain medical information by electronic transmission. I acknowledge that I have been given the opportunity to request an in-person assessment or other available alternative prior to the telemedicine visit and am voluntarily participating in the telemedicine visit.  I understand that I have the right to withhold or withdraw my consent to the use of telemedicine in the course of my care at any time, without affecting my right to future care  or treatment, and that the Practitioner or I may terminate the telemedicine visit at any time. I understand that I have the right to inspect all information obtained and/or recorded in the course of the telemedicine visit and may receive copies of available information for a reasonable fee.  I understand that some of the potential risks of receiving the Services via telemedicine include:  Marland Kitchen Delay or interruption in medical evaluation due to technological equipment failure or disruption; . Information transmitted may not be sufficient (e.g. poor resolution of images) to allow for appropriate medical decision making by the Practitioner; and/or  . In rare instances, security protocols could fail, causing a breach of personal health information.  Furthermore, I acknowledge that it is my responsibility to provide information about my medical history, conditions and care that is complete and accurate to the best of my ability. I acknowledge that Practitioner's advice, recommendations, and/or decision may be based on factors not within their control, such as incomplete or inaccurate data provided by me or distortions of diagnostic images or specimens that may result from electronic transmissions.  I understand that the practice of medicine is not an exact science and that Practitioner makes no warranties or guarantees regarding treatment outcomes. I acknowledge that I will receive a copy of this consent concurrently upon execution via email to the email address I last provided but may also request a printed copy by calling the office of [facility].    I understand that my insurance will be billed for this visit.   I have read or had this consent read to me. . I understand the contents of this consent, which adequately explains the benefits and risks of the Services being provided via telemedicine.  . I have been provided ample opportunity to ask questions regarding this consent and the Services and have had  my questions answered to my satisfaction. . I give my informed consent for the services to be provided through the use of telemedicine in my medical care  By participating in this telemedicine visit I agree to the above.

## 2018-11-08 ENCOUNTER — Other Ambulatory Visit: Payer: Self-pay

## 2018-11-08 ENCOUNTER — Telehealth (INDEPENDENT_AMBULATORY_CARE_PROVIDER_SITE_OTHER): Payer: BC Managed Care – PPO | Admitting: Cardiology

## 2018-11-08 ENCOUNTER — Encounter: Payer: Self-pay | Admitting: Cardiology

## 2018-11-08 ENCOUNTER — Telehealth: Payer: Self-pay | Admitting: Nutrition

## 2018-11-08 VITALS — HR 94 | Ht 66.0 in | Wt 192.0 lb

## 2018-11-08 DIAGNOSIS — R600 Localized edema: Secondary | ICD-10-CM | POA: Diagnosis not present

## 2018-11-08 DIAGNOSIS — I10 Essential (primary) hypertension: Secondary | ICD-10-CM

## 2018-11-08 DIAGNOSIS — E109 Type 1 diabetes mellitus without complications: Secondary | ICD-10-CM

## 2018-11-08 MED ORDER — CARVEDILOL 25 MG PO TABS: 25.0000 mg | ORAL_TABLET | Freq: Two times a day (BID) | ORAL | 3 refills | Status: DC

## 2018-11-08 MED ORDER — DEXCOM G6 SENSOR MISC: 1.0000 | 2 refills | Status: DC

## 2018-11-08 MED ORDER — FUROSEMIDE 20 MG PO TABS: 20.0000 mg | ORAL_TABLET | Freq: Every day | ORAL | 3 refills | Status: DC | PRN

## 2018-11-08 MED ORDER — DEXCOM G6 TRANSMITTER MISC: 1.0000 | 2 refills | Status: DC

## 2018-11-08 NOTE — Patient Instructions (Addendum)
Medication Instructions:  Your physician has recommended you make the following change in your medication:  INCREASE Carvedilol (Coreg) to 25 mg twice daily TAKE Lasix (Furosemide) 20 mg once daily as needed for swelling  *If you need a refill on your cardiac medications before your next appointment, please call your pharmacy*  Lab Work: None Ordered   Testing/Procedures: HOW TO TAKE YOUR BLOOD PRESSURE:  Rest 5 minutes before taking your blood pressure.   Don't smoke or drink caffeinated beverages for at least 30 minutes before.  Take your blood pressure before (not after) you eat.  Sit comfortably with your back supported and both feet on the floor (don't cross your legs).  Elevate your arm to heart level on a table or a desk.  Use the proper sized cuff. It should fit smoothly and snugly around your bare upper arm. There should be enough room to slip a fingertip under the cuff. The bottom edge of the cuff should be 1 inch above the crease of the elbow.  Ideally, take 3 measurements at one sitting and record the average.  **Call our office in 1 week to report your BP readings   Follow-Up: At [facility], you and your health needs are our priority.  As part of our continuing mission to provide you with exceptional heart care, we have created designated Provider Care Teams.  These Care Teams include your primary Cardiologist (physician) and Advanced Practice Providers (APPs -  Physician Assistants and Nurse Practitioners) who all work together to provide you with the care you need, when you need it.  Your next appointment:   12 months  The format for your next appointment:   In Person  Provider:   You may see Dr. Radford Pax or one of the following Advanced Practice Providers on your designated Care Team:    Melina Copa, PA-C  Ermalinda Barrios, PA-C

## 2018-11-08 NOTE — Progress Notes (Signed)
Virtual Visit via Telephone Note   This visit type was conducted due to national recommendations for restrictions regarding the COVID-19 Pandemic (e.g. social distancing) in an effort to limit this patient's exposure and mitigate transmission in our community.  Due to her co-morbid illnesses, this patient is at least at moderate risk for complications without adequate follow up.  This format is felt to be most appropriate for this patient at this time.  All issues noted in this document were discussed and addressed.  A limited physical exam was performed with this format.  Please refer to the patient's chart for her consent to telehealth for [facility].  Evaluation Performed:  Follow-up visit  This visit type was conducted due to national recommendations for restrictions regarding the COVID-19 Pandemic (e.g. social distancing).  This format is felt to be most appropriate for this patient at this time.  All issues noted in this document were discussed and addressed.  No physical exam was performed (except for noted visual exam findings with Video Visits).  Please refer to the patient's chart (MyChart message for video visits and phone note for telephone visits) for the patient's consent to telehealth for [facility]  Date:  11/08/2018   ID:  Barbara Donovan, DOB 12-04-1990, MRN [id number]  Patient Location:  Home  Provider location:   [facility]  PCP:  Patient, No Pcp Per  Cardiologist:  Fransico Him, MD Electrophysiologist:  None   Chief Complaint:  HTN  History of Present Illness:    Barbara Donovan is a 28 y.o. female who presents via audio/video conferencing for a telehealth visit today.    This is a 28yo female with a hx of chronic HTN.  She is here today for followup and is doing well.  She denies any chest pain or pressure, SOB, DOE, PND, orthopnea,  dizziness, palpitations or syncope. She has been having intermittent LE edema in her ankles at times.  She has stopped  eating out and is trying to watch her sodium intake.  She is compliant with her meds and is tolerating meds with no SE.    The patient does not have symptoms concerning for COVID-19 infection (fever, chills, cough, or new shortness of breath).    Prior CV studies:   The following studies were reviewed today:  none  Past Medical History:  Diagnosis Date  . Asthma 10/30/2013  . Cataract    NS OU  . Chronic hypertension during pregnancy, antepartum 08/19/2017  . Dehydration 01/28/2018  . Depression during pregnancy, antepartum 07/07/2017   6/20: Short trial of zoloft previously, reports didn't help much but also didn't give it a chance Discussed r/b/a SSRIs in pregnancy, agrees to try Zoloft again, rx sent No SI/HI/red flags  . Diabetes ([place])    TYPE I  . 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  Marland Kitchen HTN (hypertension)   . Hypertensive retinopathy    OU  . Hypokalemia 01/22/2018  . Hypomagnesemia 01/28/2018  . Intractable nausea and vomiting 01/22/2018  . Intrauterine growth restriction (IUGR) affecting care of mother 12/22/2017  . Morbid obesity ([place])   . 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 Severe hyperemesis gravidarum 10/30/2017  . Type I diabetes mellitus ([place]) 07/07/2017   Current Diabetic Medications:  Insulin  [x]  Aspirin 81 mg daily after 12 weeks (? A2/B GDM)  Required  Referrals for A1GDM or A2GDM: [x]  Diabetes Education and Testing Supplies [x]  Nutrition Cousult  For A2/B GDM or higher classes of DM [x]  Diabetes Education and Testing Supplies [x]  Nutrition Counsult [x]  Fetal ECHO after 20 weeks  [x]  Eye exam for retina evaluation - severe retinopathy 7/19  Base  . Ventricular septal defect (VSD) of fetus in singleton pregnancy, antepartum 09/30/2017   May go to newborn nursery per Dr. Lenard Simmer Echo prior to  discharge   Past Surgical History:  Procedure Laterality Date  . [place] VITRECTOMY WITH 20 GAUGE MVR PORT FOR MACULAR HOLE Left 07/20/2018   Procedure: 25 GAUGE PARS PLANA VITRECTOMY LEFT EYE;  Surgeon: Bernarda Caffey, MD;  Location: [place];  Service: Ophthalmology;  Laterality: Left;  . CESAREAN SECTION N/A 12/26/2017   Procedure: CESAREAN SECTION;  Surgeon: Osborne Oman, MD;  Location: [place];  Service: Obstetrics;  Laterality: N/A;  . EYE EXAMINATION UNDER ANESTHESIA Right 07/20/2018   Procedure: Eye Exam Under Anesthesia RIGHT EYE;  Surgeon: Bernarda Caffey, MD;  Location: [place];  Service: Ophthalmology;  Laterality: Right;  . EYE SURGERY    . INJECTION OF SILICONE OIL Left 99991111   Procedure: Injection Of Silicone Oil LEFT EYE;  Surgeon: Bernarda Caffey, MD;  Location: [place];  Service: Ophthalmology;  Laterality: Left;  . LASER PHOTO ABLATION Right 07/20/2018   Procedure: Laser Photo Ablation RIGHT EYE;  Surgeon: Bernarda Caffey, MD;  Location: [place];  Service: Ophthalmology;  Laterality: Right;  . MEMBRANE PEEL Left 07/20/2018   Procedure: Membrane Peel LEFT EYE;  Surgeon: Bernarda Caffey, MD;  Location: [place];  Service: Ophthalmology;  Laterality: Left;  . MITOMYCIN C APPLICATION Bilateral 99991111   Procedure: Avastin Application;  Surgeon: Bernarda Caffey, MD;  Location: [place];  Service: Ophthalmology;  Laterality: Bilateral;  . NO PAST SURGERIES    . PHOTOCOAGULATION WITH LASER Left 07/20/2018   Procedure: Photocoagulation With Laser LEFT EYE;  Surgeon: Bernarda Caffey, MD;  Location: [place];  Service: Ophthalmology;  Laterality: Left;  . RETINAL DETACHMENT SURGERY Left 07/20/2018   Dr. Bernarda Caffey  . WISDOM TOOTH EXTRACTION       Current Meds  Medication Sig  . Blood Pressure Monitoring (OMRON 3 SERIES BP MONITOR) DEVI 1 each by Does not apply route daily. Take daily blood pressure. Please report back to Dr. Radford Pax in 2 wks.  . brimonidine (ALPHAGAN) 0.2 % ophthalmic  solution Place 1 drop into the left eye 2 (two) times daily.  Marland Kitchen glucose blood (ONETOUCH VERIO) test strip 1 each by Other route 5 (five) times daily. Use as instructed  . insulin aspart (NOVOLOG) 100 UNIT/ML injection For use in pump, total of 60 units per day.  . Insulin Pen Needle (PEN NEEDLES) 32G X 5 MM MISC 1 Device by Does not apply route 4 (four) times daily.  . Multiple Vitamin (MULTIVITAMIN WITH MINERALS) TABS tablet Take 1 tablet by mouth daily.     Allergies:   Patient has no known allergies.   Social History   Tobacco Use  . Smoking status: Never Smoker  . Smokeless tobacco: Never Used  Substance Use Topics  . Alcohol use: Not Currently    Comment: SOCIALLY  . Drug use: Never     Family Hx: The patient's family history includes Aneurysm (age of onset: 68) in her mother; Cataracts in her father; Diabetes in her father and mother; Healthy in her brother, daughter, and sister. There is no history of Amblyopia,  Blindness, Glaucoma, Macular degeneration, Retinal detachment, Strabismus, or Retinitis pigmentosa.  ROS:   Please see the history of present illness.     All other systems reviewed and are negative.   Labs/Other Tests and Data Reviewed:    Recent Labs: 01/29/2018: ALT 14; Magnesium 2.1; TSH 1.275 07/17/2018: BUN 23; Creatinine, Ser 0.98; Hemoglobin 11.9; Platelets 434; Potassium 4.5; Sodium 134   Recent Lipid Panel No results found for: CHOL, TRIG, HDL, CHOLHDL, LDLCALC, LDLDIRECT  Wt Readings from Last 3 Encounters:  11/08/18 192 lb (87.1 kg)  11/07/18 192 lb 12.8 oz (87.5 kg)  10/19/18 187 lb (84.8 kg)     Objective:    Vital Signs:  Pulse 94   Ht 5\' 6"  (1.676 m)   Wt 192 lb (87.1 kg)   BMI 30.99 kg/m     ASSESSMENT & PLAN:    1.  HTN -BP controlled -her BP was 150/58mmHg yesterday in Endocrinologist office.  HR on apple watch is 94bpm -given her DM I would like to see her BP < 130/42mmHg -increase Carvedilol to 25mg  BID -check BP daily at  lunch for 1 week and call with results  2.  LE edema -this occurs only sporadically -she has cut back on added sodium in her diet and stopped eating out -will start lasix 20mg  daily PRN for edema -encouraged her to eat a banana or orange on the days she takes Lasix for potassium suppl  COVID-19 Education: The signs and symptoms of COVID-19 were discussed with the patient and how to seek care for testing (follow up with PCP or arrange E-visit).  The importance of social distancing was discussed today.  Patient Risk:   After full review of this patient's clinical status, I feel that they are at least moderate risk at this time.  Time:   Today, I have spent 20 minutes directly with the patient on telemedicine discussing medical problems including HTN.  We also reviewed the symptoms of COVID 19 and the ways to protect against contracting the virus with telehealth technology.   Medication Adjustments/Labs and Tests Ordered: Current medicines are reviewed at length with the patient today.  Concerns regarding medicines are outlined above.  Tests Ordered: No orders of the defined types were placed in this encounter.  Medication Changes: No orders of the defined types were placed in this encounter.   Disposition:  Follow up in 1 year(s)  Signed, Fransico Him, MD  11/08/2018 8:57 AM    [facility]

## 2018-11-08 NOTE — Telephone Encounter (Signed)
She was shown how to change change her basal rates, so she does not need to profiles and changing back and forth. She says she can not afford the sensors.  Will try this through her pharmacy benefits to see if the cost is less. Note to [name] to order through [facility]

## 2018-11-09 ENCOUNTER — Ambulatory Visit (INDEPENDENT_AMBULATORY_CARE_PROVIDER_SITE_OTHER): Payer: BC Managed Care – PPO | Admitting: Ophthalmology

## 2018-11-09 ENCOUNTER — Encounter (INDEPENDENT_AMBULATORY_CARE_PROVIDER_SITE_OTHER): Payer: Self-pay | Admitting: Ophthalmology

## 2018-11-09 ENCOUNTER — Other Ambulatory Visit: Payer: Self-pay

## 2018-11-09 DIAGNOSIS — H3581 Retinal edema: Secondary | ICD-10-CM | POA: Diagnosis not present

## 2018-11-09 DIAGNOSIS — H3342 Traction detachment of retina, left eye: Secondary | ICD-10-CM

## 2018-11-09 DIAGNOSIS — I10 Essential (primary) hypertension: Secondary | ICD-10-CM

## 2018-11-09 DIAGNOSIS — H35033 Hypertensive retinopathy, bilateral: Secondary | ICD-10-CM

## 2018-11-09 DIAGNOSIS — H4312 Vitreous hemorrhage, left eye: Secondary | ICD-10-CM | POA: Diagnosis not present

## 2018-11-09 DIAGNOSIS — E103513 Type 1 diabetes mellitus with proliferative diabetic retinopathy with macular edema, bilateral: Secondary | ICD-10-CM

## 2018-11-09 DIAGNOSIS — H25813 Combined forms of age-related cataract, bilateral: Secondary | ICD-10-CM

## 2018-11-10 ENCOUNTER — Encounter (INDEPENDENT_AMBULATORY_CARE_PROVIDER_SITE_OTHER): Payer: Self-pay | Admitting: Ophthalmology

## 2018-12-03 ENCOUNTER — Emergency Department (HOSPITAL_COMMUNITY)
Admission: EM | Admit: 2018-12-03 | Discharge: 2018-12-03 | Disposition: A | Discharge status: Home / Self Care | Payer: BC Managed Care – PPO | Attending: Emergency Medicine | Admitting: Emergency Medicine

## 2018-12-03 ENCOUNTER — Encounter (HOSPITAL_COMMUNITY): Payer: Self-pay | Admitting: Emergency Medicine

## 2018-12-03 ENCOUNTER — Other Ambulatory Visit: Payer: Self-pay

## 2018-12-03 DIAGNOSIS — I10 Essential (primary) hypertension: Secondary | ICD-10-CM | POA: Insufficient documentation

## 2018-12-03 DIAGNOSIS — S199XXA Unspecified injury of neck, initial encounter: Secondary | ICD-10-CM | POA: Diagnosis present

## 2018-12-03 DIAGNOSIS — E109 Type 1 diabetes mellitus without complications: Secondary | ICD-10-CM | POA: Diagnosis not present

## 2018-12-03 DIAGNOSIS — J45909 Unspecified asthma, uncomplicated: Secondary | ICD-10-CM | POA: Insufficient documentation

## 2018-12-03 DIAGNOSIS — Z794 Long term (current) use of insulin: Secondary | ICD-10-CM | POA: Diagnosis not present

## 2018-12-03 DIAGNOSIS — Y92410 Unspecified street and highway as the place of occurrence of the external cause: Secondary | ICD-10-CM | POA: Diagnosis not present

## 2018-12-03 DIAGNOSIS — S161XXA Strain of muscle, fascia and tendon at neck level, initial encounter: Secondary | ICD-10-CM | POA: Insufficient documentation

## 2018-12-03 DIAGNOSIS — Y999 Unspecified external cause status: Secondary | ICD-10-CM | POA: Diagnosis not present

## 2018-12-03 DIAGNOSIS — Y93I9 Activity, other involving external motion: Secondary | ICD-10-CM | POA: Diagnosis not present

## 2018-12-03 MED ORDER — ACETAMINOPHEN 325 MG PO TABS
650.0000 mg | ORAL_TABLET | Freq: Once | ORAL | Status: AC
  Administered 2018-12-03: 10:00:00 650 mg via ORAL
  Filled 2018-12-03: qty 2

## 2018-12-03 MED ORDER — METHOCARBAMOL 500 MG PO TABS: 500.0000 mg | ORAL_TABLET | Freq: Two times a day (BID) | ORAL | 0 refills | Status: DC
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
## 2018-12-03 NOTE — Discharge Instructions (Addendum)
Use Tylenol ibuprofen every 6 hours for pain as discussed.  You can use 500 mg of Tylenol per dose and 600 mg of ibuprofen per dose.  You were given a prescription for robaxin which is a muscle relaxer.  You should not drive, work, consume alcohol, or operate machinery while taking this medication as it can make you very drowsy.
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
## 2018-12-08 ENCOUNTER — Encounter (INDEPENDENT_AMBULATORY_CARE_PROVIDER_SITE_OTHER): Payer: Self-pay | Admitting: Ophthalmology

## 2018-12-08 ENCOUNTER — Ambulatory Visit (INDEPENDENT_AMBULATORY_CARE_PROVIDER_SITE_OTHER): Payer: BC Managed Care – PPO | Admitting: Ophthalmology

## 2018-12-08 DIAGNOSIS — H35033 Hypertensive retinopathy, bilateral: Secondary | ICD-10-CM

## 2018-12-08 DIAGNOSIS — H25813 Combined forms of age-related cataract, bilateral: Secondary | ICD-10-CM

## 2018-12-08 DIAGNOSIS — H3342 Traction detachment of retina, left eye: Secondary | ICD-10-CM | POA: Diagnosis not present

## 2018-12-08 DIAGNOSIS — I10 Essential (primary) hypertension: Secondary | ICD-10-CM

## 2018-12-08 DIAGNOSIS — H4313 Vitreous hemorrhage, bilateral: Secondary | ICD-10-CM

## 2018-12-08 DIAGNOSIS — E103513 Type 1 diabetes mellitus with proliferative diabetic retinopathy with macular edema, bilateral: Secondary | ICD-10-CM | POA: Diagnosis not present

## 2018-12-08 DIAGNOSIS — H3581 Retinal edema: Secondary | ICD-10-CM

## 2018-12-08 DIAGNOSIS — E103313 Type 1 diabetes mellitus with moderate nonproliferative diabetic retinopathy with macular edema, bilateral: Secondary | ICD-10-CM

## 2018-12-08 DIAGNOSIS — H34813 Central retinal vein occlusion, bilateral, with macular edema: Secondary | ICD-10-CM

## 2018-12-08 DIAGNOSIS — H2513 Age-related nuclear cataract, bilateral: Secondary | ICD-10-CM

## 2018-12-13 ENCOUNTER — Ambulatory Visit (INDEPENDENT_AMBULATORY_CARE_PROVIDER_SITE_OTHER): Payer: BC Managed Care – PPO | Admitting: Endocrinology

## 2018-12-13 ENCOUNTER — Encounter: Payer: Self-pay | Admitting: Endocrinology

## 2018-12-13 ENCOUNTER — Other Ambulatory Visit: Payer: Self-pay

## 2018-12-13 VITALS — BP 150/90 | HR 88 | Ht 66.0 in | Wt 186.0 lb

## 2018-12-13 DIAGNOSIS — E10319 Type 1 diabetes mellitus with unspecified diabetic retinopathy without macular edema: Secondary | ICD-10-CM

## 2018-12-13 MED ORDER — SERTRALINE HCL 25 MG PO TABS: 25.0000 mg | ORAL_TABLET | Freq: Every day | ORAL | 3 refills | Status: DC

## 2018-12-13 NOTE — Patient Instructions (Addendum)
Your blood pressure is high today.  Please see a primary care provider soon, to have it rechecked.   please take these pump settings: basal rate of 2.4 unit/hr, 7 AM-9 PM, and 0.8 units/hr overnight.   No meal bolus.  correction bolus (which some people call "sensitivity," or "insulin sensitivity ratio," or just "isr") of 1 unit for each 30 by which your glucose exceeds 100.   On this type of pump schedule, you should eat meals on a regular schedule.  If a meal is missed or significantly delayed, your blood sugar could go low.   check your blood sugar 5 times a day: before the 3 meals, and at bedtime.  also check if you have symptoms of your blood sugar being too high or too low.  please keep a record of the readings and bring it to your next appointment here (or you can bring the meter itself).  You can write it on any piece of paper.  please call us sooner if your blood sugar goes below 70, or if you have a lot of readings over 200.   Please come back for a follow-up appointment in early January.

## 2018-12-13 NOTE — Progress Notes (Signed)
Subjective:    Patient ID: Barbara Donovan, female    DOB: Jul 24, 1990, 28 y.o.   MRN: [id number]  HPI Pt returns for f/u of diabetes mellitus: DM type: 1 Dx'ed: 123456 Complications: PDR Therapy: insulin since 2009, and pump rx since 2020.  DKA: last episode was 2015.  Severe hypoglycemia: never Pancreatitis: never Pancreatic imaging: never.  Other: she takes multiple daily injections; she took pump rx during 2019 pregnancy, but not continuous glucose monitor, due to cost.  Interval history:  She takes these settings:  basal rate of 2.3 unit/hr, 7 AM-9 PM, and 0.7 units/hr overnight.   No meal bolus.  correction bolus (which some people call "sensitivity," or "insulin sensitivity ratio," or just "isr") of 1 unit for each 30 by which glucose exceeds 100.   TDD is 50 units (77% basal, 8% meal bolus, and 14% correction bolus.   Meter is downloaded today, and the printout is scanned into the record.  Glucose varies from 45-434.  There is no trend throughout the day, but it is in general lowest when a meal is missed.   Anxiety is better now, on sertraline.   Past Medical History:  Diagnosis Date  . Asthma 10/30/2013  . Cataract    NS OU  . Chronic hypertension during pregnancy, antepartum 08/19/2017  . Dehydration 01/28/2018  . Depression during pregnancy, antepartum 07/07/2017   6/20: Short trial of zoloft previously, reports didn't help much but also didn't give it a chance Discussed r/b/a SSRIs in pregnancy, agrees to try Zoloft again, rx sent No SI/HI/red flags  . Diabetes ([place])    TYPE I  . 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  Marland Kitchen HTN (hypertension)   . Hypertensive retinopathy    OU  . Hypokalemia 01/22/2018  . Hypomagnesemia 01/28/2018  . Intractable nausea and vomiting 01/22/2018  . Intrauterine growth restriction (IUGR) affecting care of mother 12/22/2017  . Morbid obesity ([place])   .  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 Proteinuria due to type 1 diabetes mellitus ([place]) 11/02/2017   Baseline Pr: Cr 10.23  . Severe hyperemesis gravidarum 10/30/2017  . Type I diabetes mellitus ([place]) 07/07/2017   Current Diabetic Medications:  Insulin  [x]  Aspirin 81 mg daily after 12 weeks (? A2/B GDM)  Required Referrals for A1GDM or A2GDM: [x]  Diabetes Education and Testing Supplies [x]  Nutrition Cousult  For A2/B GDM or higher classes of DM [x]  Diabetes Education and Testing Supplies [x]  Nutrition Counsult [x]  Fetal ECHO after 20 weeks  [x]  Eye exam for retina evaluation - severe retinopathy 7/19  Base  . Ventricular septal defect (VSD) of fetus in singleton pregnancy, antepartum 09/30/2017   May go to newborn nursery per Dr. Lenard Simmer Echo prior to discharge    Past Surgical History:  Procedure Laterality Date  . [place] VITRECTOMY WITH 20 GAUGE MVR PORT FOR MACULAR HOLE Left 07/20/2018   Procedure: 25 GAUGE PARS PLANA VITRECTOMY LEFT EYE;  Surgeon: Bernarda Caffey, MD;  Location: [facility];  Service: Ophthalmology;  Laterality: Left;  . CESAREAN SECTION N/A 12/26/2017   Procedure: CESAREAN SECTION;  Surgeon: Osborne Oman, MD;  Location: [facility];  Service: Obstetrics;  Laterality: N/A;  . EYE EXAMINATION UNDER ANESTHESIA Right 07/20/2018   Procedure: Eye Exam Under Anesthesia RIGHT EYE;  Surgeon: Bernarda Caffey, MD;  Location: [facility];  Service: Ophthalmology;  Laterality: Right;  . EYE SURGERY    . INJECTION  OF SILICONE OIL Left 99991111   Procedure: Injection Of Silicone Oil LEFT EYE;  Surgeon: Bernarda Caffey, MD;  Location: [place];  Service: Ophthalmology;  Laterality: Left;  . LASER PHOTO ABLATION Right 07/20/2018   Procedure: Laser Photo Ablation RIGHT EYE;  Surgeon: Bernarda Caffey, MD;  Location: [facility];  Service: Ophthalmology;  Laterality: Right;  . MEMBRANE PEEL Left 07/20/2018   Procedure: Membrane Peel LEFT EYE;  Surgeon: Bernarda Caffey, MD;  Location: [facility];  Service:  Ophthalmology;  Laterality: Left;  . MITOMYCIN C APPLICATION Bilateral 99991111   Procedure: Avastin Application;  Surgeon: Bernarda Caffey, MD;  Location: [facility];  Service: Ophthalmology;  Laterality: Bilateral;  . NO PAST SURGERIES    . PHOTOCOAGULATION WITH LASER Left 07/20/2018   Procedure: Photocoagulation With Laser LEFT EYE;  Surgeon: Bernarda Caffey, MD;  Location: [facility];  Service: Ophthalmology;  Laterality: Left;  . RETINAL DETACHMENT SURGERY Left 07/20/2018   Dr. Bernarda Caffey  . WISDOM TOOTH EXTRACTION      Social History   Socioeconomic History  . Marital status: Single    Spouse name: Not on file  . Number of children: Not on file  . Years of education: Not on file  . Highest education level: Not on file  Occupational History  . Not on file  Social Needs  . Financial resource strain: Not on file  . Food insecurity    Worry: Not on file    Inability: Not on file  . Transportation needs    Medical: Not on file    Non-medical: Not on file  Tobacco Use  . Smoking status: Never Smoker  . Smokeless tobacco: Never Used  Substance and Sexual Activity  . Alcohol use: Not Currently    Comment: SOCIALLY  . Drug use: Never  . Sexual activity: Not Currently    Birth control/protection: None  Lifestyle  . Physical activity    Days per week: Not on file    Minutes per session: Not on file  . Stress: Not on file  Relationships  . Social Herbalist on phone: Not on file    Gets together: Not on file    Attends religious service: Not on file    Active member of club or organization: Not on file    Attends meetings of clubs or organizations: Not on file    Relationship status: Not on file  . Intimate partner violence    Fear of current or ex partner: Not on file    Emotionally abused: Not on file    Physically abused: Not on file    Forced sexual activity: Not on file  Other Topics Concern  . Not on file  Social History Narrative  . Not on file    Current  Outpatient Medications on File Prior to Visit  Medication Sig Dispense Refill  . Blood Pressure Monitoring (OMRON 3 SERIES BP MONITOR) DEVI 1 each by Does not apply route daily. Take daily blood pressure. Please report back to Dr. Radford Pax in 2 wks. 1 Device 0  . brimonidine (ALPHAGAN) 0.2 % ophthalmic solution Place 1 drop into the left eye 2 (two) times daily.    . carvedilol (COREG) 25 MG tablet Take 1 tablet (25 mg total) by mouth 2 (two) times daily. 180 tablet 3  . furosemide (LASIX) 20 MG tablet Take 1 tablet (20 mg total) by mouth daily as needed (swelling). 60 tablet 3  . glucose blood (ONETOUCH VERIO) test strip 1  each by Other route 5 (five) times daily. Use as instructed 200 each 2  . insulin aspart (NOVOLOG) 100 UNIT/ML injection For use in pump, total of 60 units per day. 20 mL 11  . Insulin Pen Needle (PEN NEEDLES) 32G X 5 MM MISC 1 Device by Does not apply route 4 (four) times daily. 120 each 11  . methocarbamol (ROBAXIN) 500 MG tablet Take 1 tablet (500 mg total) by mouth 2 (two) times daily. 20 tablet 0  . Multiple Vitamin (MULTIVITAMIN WITH MINERALS) TABS tablet Take 1 tablet by mouth daily.    . Continuous Blood Gluc Sensor (DEXCOM G6 SENSOR) MISC 1 each by Does not apply route See admin instructions. For use with continuous glucose monitoring system. Change sensor every 10 days; E11.9 (Patient not taking: Reported on 12/13/2018) 3 each 2  . Continuous Blood Gluc Transmit (DEXCOM G6 TRANSMITTER) MISC 1 each by Does not apply route See admin instructions. For continuous glucose monitoring; E11.9 (Patient not taking: Reported on 12/13/2018) 1 each 2   No current facility-administered medications on file prior to visit.     No Known Allergies  Family History  Problem Relation Age of Onset  . Diabetes Mother   . Aneurysm Mother 55  . Diabetes Father   . Cataracts Father   . Healthy Sister   . Healthy Brother   . Healthy Daughter   . Amblyopia Neg Hx   . Blindness Neg Hx    . Glaucoma Neg Hx   . Macular degeneration Neg Hx   . Retinal detachment Neg Hx   . Strabismus Neg Hx   . Retinitis pigmentosa Neg Hx     BP (!) 150/90   Pulse 88   Ht 5\' 6"  (1.676 m)   Wt 186 lb (84.4 kg)   SpO2 98%   BMI 30.02 kg/m   Review of Systems Denies LOC.      Objective:   Physical Exam VITAL SIGNS:  See vs page GENERAL: no distress Pulses: dorsalis pedis intact bilat.   MSK: no deformity of the feet CV: trace bilat leg edema Skin:  no ulcer on the feet.  normal color and temp on the feet.  Neuro: sensation is intact to touch on the feet  Lab Results  Component Value Date   HGBA1C 9.1 (A) 11/07/2018       Assessment & Plan:  Type 1 DM, with DR: she needs increased rx Hypoglycemia: this limits aggressiveness of glycemic control.  we discussed benefit of aggressive glycemic control, even at the risk of severe hypoglycemia.   HTN: is noted today  Patient Instructions  Your blood pressure is high today.  Please see a primary care provider soon, to have it rechecked.   please take these pump settings: basal rate of 2.4 unit/hr, 7 AM-9 PM, and 0.8 units/hr overnight.   No meal bolus.  correction bolus (which some people call "sensitivity," or "insulin sensitivity ratio," or just "isr") of 1 unit for each 30 by which your glucose exceeds 100.   On this type of pump schedule, you should eat meals on a regular schedule.  If a meal is missed or significantly delayed, your blood sugar could go low.   check your blood sugar 5 times a day: before the 3 meals, and at bedtime.  also check if you have symptoms of your blood sugar being too high or too low.  please keep a record of the readings and bring it to your next appointment here (  or you can bring the meter itself).  You can write it on any piece of paper.  please call us sooner if your blood sugar goes below 70, or if you have a lot of readings over 200.   Please come back for a follow-up appointment in early January.

## 2018-12-18 ENCOUNTER — Telehealth: Payer: Self-pay | Admitting: Cardiology

## 2018-12-18 ENCOUNTER — Ambulatory Visit (INDEPENDENT_AMBULATORY_CARE_PROVIDER_SITE_OTHER): Payer: BC Managed Care – PPO | Admitting: Ophthalmology

## 2018-12-18 ENCOUNTER — Encounter (INDEPENDENT_AMBULATORY_CARE_PROVIDER_SITE_OTHER): Payer: Self-pay | Admitting: Ophthalmology

## 2018-12-18 DIAGNOSIS — E103513 Type 1 diabetes mellitus with proliferative diabetic retinopathy with macular edema, bilateral: Secondary | ICD-10-CM | POA: Diagnosis not present

## 2018-12-18 DIAGNOSIS — H4313 Vitreous hemorrhage, bilateral: Secondary | ICD-10-CM

## 2018-12-18 DIAGNOSIS — I10 Essential (primary) hypertension: Secondary | ICD-10-CM

## 2018-12-18 DIAGNOSIS — H3581 Retinal edema: Secondary | ICD-10-CM | POA: Diagnosis not present

## 2018-12-18 DIAGNOSIS — H35033 Hypertensive retinopathy, bilateral: Secondary | ICD-10-CM

## 2018-12-18 DIAGNOSIS — H3342 Traction detachment of retina, left eye: Secondary | ICD-10-CM

## 2018-12-18 DIAGNOSIS — H25813 Combined forms of age-related cataract, bilateral: Secondary | ICD-10-CM

## 2018-12-18 MED ORDER — BEVACIZUMAB CHEMO INJECTION 1.25MG/0.05ML SYRINGE FOR KALEIDOSCOPE
1.2500 mg | INTRAVITREAL | Status: AC | PRN
  Administered 2018-12-18: 1.25 mg via INTRAVITREAL

## 2018-12-18 NOTE — Telephone Encounter (Signed)
Patient called stating she is currently on carvedilol (COREG) 25 MG tablet. She has hemorrhage in her eye, she just left her retina specialist. She wants to know if she can be changed to a different medication.

## 2018-12-18 NOTE — Telephone Encounter (Signed)
I spoke with pt. She reports seeing retina specialist about 2 weeks ago due to cloudiness in eye. Was told she had blood in her eye.  Initially improved but returned on Thanksgiving. Saw specialist today and pt reports she was told blood in her eye is probably related to elevated BP.  Chart notes indicate BP was elevated at recent visit with Dr Loanne Drilling on 11/25-150/90. Pt is taking Coreg 25 daily. Has not been checking BP everyday but reports the following readings 145/99, 136/84,99 154/102,100. Last night 171/? These readings are over the last several days.  Will forward to Dr Radford Pax for review/recommendations
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
## 2018-12-19 NOTE — Telephone Encounter (Signed)
Per pt is taking Carvedilol 25 mg bid and has not missed any doses ./cy

## 2018-12-19 NOTE — Telephone Encounter (Signed)
Is she taking her Carvedilol twice daily and has she missed any doses

## 2018-12-20 ENCOUNTER — Encounter (INDEPENDENT_AMBULATORY_CARE_PROVIDER_SITE_OTHER): Payer: BC Managed Care – PPO | Admitting: Ophthalmology

## 2018-12-20 NOTE — Telephone Encounter (Signed)
Add Cardizem CD 120mg  daily and check Bp and HR daily for a week and call with the results

## 2018-12-21 MED ORDER — DILTIAZEM HCL ER COATED BEADS 120 MG PO CP24: 120.0000 mg | ORAL_CAPSULE | Freq: Every day | ORAL | 3 refills | Status: DC

## 2018-12-21 NOTE — Telephone Encounter (Signed)
Pt advised and will call next week with her BP and HR readings after starting the Cardizem CD 120 mg qd.

## 2018-12-28 NOTE — Progress Notes (Signed)
[facility] Note  12/29/2018     CHIEF COMPLAINT Patient presents for Retina Follow Up   HISTORY OF PRESENT ILLNESS: Barbara Donovan is a 28 y.o. female who presents to the clinic today for:   HPI    Retina Follow Up    Patient presents with  Diabetic Retinopathy.  In both eyes.  Severity is moderate.  Duration of 2 weeks.  Since onset it is gradually improving.  I, the attending physician,  performed the HPI with the patient and updated documentation appropriately.          Comments    Patient states vision improving, especially in OD. Was taking cosopt tid OS and brimonidine bid OS, but patient ran out of both gtts last week. BS was 160 this am. Last a1c was 9.1 about a month ago.       Last edited by Bernarda Caffey, MD on 12/31/2018 11:39 PM. (History)    pt had cataract sx scheduled with Dr. Eulas Post on December 8th, but states she needed another measurement, so they postponed it until December 15th  Referring physician: Fanny Bien, MD [address] [place],  [place] [address]  HISTORICAL INFORMATION:   Selected notes from the MEDICAL RECORD NUMBER Referred by Dr. [name] for concern of bilateral CRVO LEE-  Ocular Hx-  PMH-     CURRENT MEDICATIONS: Current Outpatient Medications (Ophthalmic Drugs)  Medication Sig  . brimonidine (ALPHAGAN) 0.2 % ophthalmic solution Place 1 drop into the left eye 2 (two) times daily.   No current facility-administered medications for this visit. (Ophthalmic Drugs)   Current Outpatient Medications (Other)  Medication Sig  . Blood Pressure Monitoring (OMRON 3 SERIES BP MONITOR) DEVI 1 each by Does not apply route daily. Take daily blood pressure. Please report back to Dr. Radford Pax in 2 wks.  . carvedilol (COREG) 25 MG tablet Take 1 tablet (25 mg total) by mouth 2 (two) times daily.  . Continuous Blood Gluc Sensor (DEXCOM G6 SENSOR) MISC 1 each by Does not apply route See admin instructions. For  use with continuous glucose monitoring system. Change sensor every 10 days; E11.9  . Continuous Blood Gluc Transmit (DEXCOM G6 TRANSMITTER) MISC 1 each by Does not apply route See admin instructions. For continuous glucose monitoring; E11.9  . diltiazem (CARDIZEM CD) 120 MG 24 hr capsule Take 1 capsule (120 mg total) by mouth daily.  . furosemide (LASIX) 20 MG tablet Take 1 tablet (20 mg total) by mouth daily as needed (swelling).  Marland Kitchen glucose blood (ONETOUCH VERIO) test strip 1 each by Other route 5 (five) times daily. Use as instructed  . insulin aspart (NOVOLOG) 100 UNIT/ML injection For use in pump, total of 60 units per day.  . Insulin Pen Needle (PEN NEEDLES) 32G X 5 MM MISC 1 Device by Does not apply route 4 (four) times daily.  . methocarbamol (ROBAXIN) 500 MG tablet Take 1 tablet (500 mg total) by mouth 2 (two) times daily.  . Multiple Vitamin (MULTIVITAMIN WITH MINERALS) TABS tablet Take 1 tablet by mouth daily.  . sertraline (ZOLOFT) 50 MG tablet Take 25-50 mg by mouth daily.  . sertraline (ZOLOFT) 25 MG tablet Take 1 tablet (25 mg total) by mouth daily. (Patient not taking: Reported on 12/18/2018)   No current facility-administered medications for this visit. (Other)      REVIEW OF SYSTEMS: ROS    Positive for: Genitourinary, Endocrine, Eyes, Respiratory   Negative  for: Constitutional, Gastrointestinal, Neurological, Skin, Musculoskeletal, HENT, Cardiovascular, Psychiatric, Allergic/Imm, Heme/Lymph   Last edited by Roselee Nova D, COT on 12/29/2018  9:50 AM. (History)       ALLERGIES No Known Allergies  PAST MEDICAL HISTORY Past Medical History:  Diagnosis Date  . Asthma 10/30/2013  . Cataract    NS OU  . Chronic hypertension during pregnancy, antepartum 08/19/2017  . Dehydration 01/28/2018  . Depression during pregnancy, antepartum 07/07/2017   6/20: Short trial of zoloft previously, reports didn't help much but also didn't give it a chance Discussed r/b/a SSRIs in  pregnancy, agrees to try Zoloft again, rx sent No SI/HI/red flags  . Diabetes ([place])    TYPE I  . Diabetic retinopathy ([place]) 06/09/2017   07/2017 with bilateral severe diabetic non-proliferative retinopathy with macular edema.  Marland Kitchen HTN (hypertension)   . Hypertensive retinopathy    OU  . Hypokalemia 01/22/2018  . Hypomagnesemia 01/28/2018  . Intractable nausea and vomiting 01/22/2018  . Intrauterine growth restriction (IUGR) affecting care of mother 12/22/2017  . Morbid obesity ([place])   . 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 Proteinuria due to type 1 diabetes mellitus ([place]) 11/02/2017   Baseline Pr: Cr 10.23  . Severe hyperemesis gravidarum 10/30/2017  . Type I diabetes mellitus ([place]) 07/07/2017   Current Diabetic Medications:  Insulin  [x]  Aspirin 81 mg daily after 12 weeks (? A2/B GDM)  Required Referrals for A1GDM or A2GDM: [x]  Diabetes Education and Testing Supplies [x]  Nutrition Cousult  For A2/B GDM or higher classes of DM [x]  Diabetes Education and Testing Supplies [x]  Nutrition Counsult [x]  Fetal ECHO after 20 weeks  [x]  Eye exam for retina evaluation - severe retinopathy 7/19  Base  . Ventricular septal defect (VSD) of fetus in singleton pregnancy, antepartum 09/30/2017   May go to newborn nursery per Dr. Lenard Simmer Echo prior to discharge   Past Surgical History:  Procedure Laterality Date  . [place] VITRECTOMY WITH 20 GAUGE MVR PORT FOR MACULAR HOLE Left 07/20/2018   Procedure: 25 GAUGE PARS PLANA VITRECTOMY LEFT EYE;  Surgeon: Bernarda Caffey, MD;  Location: [facility];  Service: Ophthalmology;  Laterality: Left;  . CESAREAN SECTION N/A 12/26/2017   Procedure: CESAREAN SECTION;  Surgeon: Osborne Oman, MD;  Location: [facility];  Service: Obstetrics;  Laterality: N/A;  . EYE EXAMINATION UNDER ANESTHESIA Right 07/20/2018   Procedure: Eye Exam Under Anesthesia RIGHT EYE;  Surgeon: Bernarda Caffey, MD;  Location: [facility];  Service: Ophthalmology;  Laterality: Right;  . EYE SURGERY     . INJECTION OF SILICONE OIL Left 99991111   Procedure: Injection Of Silicone Oil LEFT EYE;  Surgeon: Bernarda Caffey, MD;  Location: [facility];  Service: Ophthalmology;  Laterality: Left;  . LASER PHOTO ABLATION Right 07/20/2018   Procedure: Laser Photo Ablation RIGHT EYE;  Surgeon: Bernarda Caffey, MD;  Location: [facility];  Service: Ophthalmology;  Laterality: Right;  . MEMBRANE PEEL Left 07/20/2018   Procedure: Membrane Peel LEFT EYE;  Surgeon: Bernarda Caffey, MD;  Location: [facility];  Service: Ophthalmology;  Laterality: Left;  . MITOMYCIN C APPLICATION Bilateral 99991111   Procedure: Avastin Application;  Surgeon: Bernarda Caffey, MD;  Location: [facility];  Service: Ophthalmology;  Laterality: Bilateral;  . NO PAST SURGERIES    . PHOTOCOAGULATION WITH LASER Left 07/20/2018   Procedure: Photocoagulation With Laser LEFT EYE;  Surgeon: Bernarda Caffey, MD;  Location: [facility];  Service: Ophthalmology;  Laterality: Left;  . RETINAL DETACHMENT SURGERY Left 07/20/2018  Dr. Bernarda Caffey  . WISDOM TOOTH EXTRACTION      FAMILY HISTORY Family History  Problem Relation Age of Onset  . Diabetes Mother   . Aneurysm Mother 27  . Diabetes Father   . Cataracts Father   . Healthy Sister   . Healthy Brother   . Healthy Daughter   . Amblyopia Neg Hx   . Blindness Neg Hx   . Glaucoma Neg Hx   . Macular degeneration Neg Hx   . Retinal detachment Neg Hx   . Strabismus Neg Hx   . Retinitis pigmentosa Neg Hx     SOCIAL HISTORY Social History   Tobacco Use  . Smoking status: Never Smoker  . Smokeless tobacco: Never Used  Substance Use Topics  . Alcohol use: Not Currently    Comment: SOCIALLY  . Drug use: Never         OPHTHALMIC EXAM:  Base Eye Exam    Visual Acuity (Snellen - Linear)      Right Left   Dist cc 20/25 -2 CF at 3'   Dist ph cc 20/25 20/200       Tonometry (Tonopen, 10:00 AM)      Right Left   Pressure 20 18       Pupils      Dark Light Shape React APD   Right 4 3 Round Slow None   Left  8 8 Round None None       Visual Fields (Counting fingers)      Left Right     Full   Restrictions Total superior temporal, superior nasal deficiencies        Extraocular Movement      Right Left    Full, Ortho Full, Ortho       Neuro/Psych    Oriented x3: Yes   Mood/Affect: Normal       Dilation    Both eyes: 1.0% Mydriacyl, 2.5% Phenylephrine @ 10:00 AM        Slit Lamp and Fundus Exam    Slit Lamp Exam      Right Left   Lids/Lashes Normal Mild periorbital edema   Conjunctiva/Sclera White and quiet 2+ injection ST , sutures dissolving   Cornea 1+ Punctate epithelial erosions mild endo pigment inferiorly   Anterior Chamber Deep and quiet Deep and quiet   Iris Round, moderate dilation, No NVI Round and dilated to 5.1mm, No NVI   Lens trace Nuclear sclerosis and cortical changes 1+Nuclear sclerosis, 1+cortical changes, 3-4+PSC   Vitreous trace pigment in anterior vitreous, VH clearing and settling inferiorly post vitrectomy, good oil fill       Fundus Exam      Right Left   Disc Pink and Sharp Pink and sharp   C/D Ratio 0.3 0.2   Macula Flat, good foveal reflex, scattered MA/IRH/exudate - improved, +ERM flat under oil, improving edema, mild IRH, scattered DBH   Vessels Vascular attenuation, Tortuous, regressing fibrotic NV Attenuated, perivascular fibrosis IT arcades   Periphery Attached, subhyaloid heme inferior to disc, PRP 360 room for fill in  attached under oil; peripapillary fibrosis regressing under oil, good 360 PRP.        Refraction    Wearing Rx      Sphere Cylinder   Right -2.00 Sphere   Left -1.50 Sphere       Manifest Refraction      Sphere Cylinder Axis Dist VA   Right -1.50 Sphere  20/25-2   Left +6.25 +  0.50 165 20/150-2          IMAGING AND PROCEDURES  Imaging and Procedures for @TODAY @  OCT, Retina - OU - Both Eyes       Right Eye Quality was borderline. Central Foveal Thickness: 218. Progression has improved. Findings include no  SRF, intraretinal hyper-reflective material, no IRF, normal foveal contour, vitreous traction, macular pucker, epiretinal membrane, preretinal fibrosis (Interval improvement in vitreous opacities).   Left Eye Quality was borderline. Central Foveal Thickness: 553. Progression has been stable. Findings include abnormal foveal contour, epiretinal membrane, no SRF, no IRF, outer retinal atrophy (Attached under oil; nasal retinal thickening stable).   Notes *Images captured and stored on drive  Diagnosis / Impression:  PDR OU OD: mild preretinal fibrosis/ERM/pucker/interval improvement in vitreous opactities OS: retina attached under oil; nasal retinal thickening stable   Clinical management:  See below  Abbreviations: NFP - Normal foveal profile. CME - cystoid macular edema. PED - pigment epithelial detachment. IRF - intraretinal fluid. SRF - subretinal fluid. EZ - ellipsoid zone. ERM - epiretinal membrane. ORA - outer retinal atrophy. ORT - outer retinal tubulation. SRHM - subretinal hyper-reflective material                  ASSESSMENT/PLAN:    ICD-10-CM   1. 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 Traction detachment of left retina  H33.42   3. Vitreous hemorrhage of both eyes ([place])  H43.13   4. Retinal edema  H35.81 OCT, Retina - OU - Both Eyes  5. Essential hypertension  I10   6. Hypertensive retinopathy of both eyes  H35.033   7. Combined forms of age-related cataract of both eyes  H25.813 IOL Biometry - OU - Both Eyes    1-4. 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 (OS > OD)  - s/p PRP OS (11.26.19)  - s/p PRP OD (06.01.20)  - lost to f/u following 11.26.19 appt due to complicated pregnancy, premature birth of baby w/ extended NICU stay and congenital heart defect, and post-partum health issues  - history of vision loss OS onset December 2019  - OS with chronic VH and TRD, likely present  since Dec 2019  - s/p IVA OS #1 (06.23.20), #2 (07.02.20)  - s/p IVA OD #2 (11.30.20)  - s/p PPV/MP/EL/silicon oil + IVA OS for VH and TRD from DM1, 07.02.20             - doing well             - retina attached under silicon oil -- improved heme, fibrosis and edema             - IOP 18 OS  - visually significant PSC OS (see below)  - cont Cosopt TID OS  - cont Brimonidine TID OS             - avoid laying flat on back              - post op drop and positioning instructions reviewed   - s/p fill in PRP and IVA OD 07.02.2020 in operating room  - new onset diffuse VH OD since ~11.26.20 --   - s/p IVA OD #2 (11.30.20) improved today  - pt reports recent elevations in BP, meds have been adjusted, but could have contributed to [facility] OD  - VH precautions reviewed -- minimize activities, keep head elevated, avoid ASA/NSAIDs/blood thinners  as able  - f/u early January, DFE, OCT, possible injection   5,6. Hypertensive retinopathy OU  - there was concern for pregnancy related hypertension (I.e. Pre-eclampsia, Eclampsia, HELLP, etc) during pregnancy  - also during pregnancy (7.17.2019), had bilateral CRVO w/ CME (OD > OS) s/p PRP OS  - discussed importance of tight BP control  - monitor  7. 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  - discussed diagnosis and likely progression of cataract OS following PPV w/ silicon oil  - [id number] visually significant PSC OS  - clear from a retina standpoint for cataract removal and IOP implantation  - Had cataract consult with Dr. Eulas Post on 11/25/2018 -- surgery scheduled for Dec 15th    Ophthalmic Meds Ordered this visit:  No orders of the defined types were placed in this encounter.     Return for f/u early January VH OD, DFE, OCT.  There are no Patient Instructions on file for this visit.   Explained the diagnoses, plan, and follow up with the patient and they  expressed understanding.  Patient expressed understanding of the importance of proper follow up care.  This document serves as a record of services personally performed by Gardiner Sleeper, MD, PhD. It was created on their behalf by Ernest Mallick, OA, an ophthalmic assistant. The creation of this record is the provider's dictation and/or activities during the visit.    Electronically signed by: Ernest Mallick, OA 12.10.2020 11:47 PM   Gardiner Sleeper, M.D., Ph.D. Diseases & Surgery of the Retina and Vitreous [facility]  I have reviewed the above documentation for accuracy and completeness, and I agree with the above. Gardiner Sleeper, M.D., Ph.D. 12/31/18 11:47 PM     Abbreviations: M myopia (nearsighted); A astigmatism; H hyperopia (farsighted); P presbyopia; Mrx spectacle prescription;  CTL contact lenses; OD right eye; OS left eye; OU both eyes  XT exotropia; ET esotropia; PEK punctate epithelial keratitis; PEE punctate epithelial erosions; DES dry eye syndrome; MGD meibomian gland dysfunction; ATs artificial tears; PFAT's preservative free artificial tears; [place] nuclear sclerotic cataract; PSC posterior subcapsular cataract; ERM epi-retinal membrane; PVD posterior vitreous detachment; RD retinal detachment; DM diabetes mellitus; DR diabetic retinopathy; NPDR non-proliferative diabetic retinopathy; PDR proliferative diabetic retinopathy; CSME clinically significant macular edema; DME diabetic macular edema; dbh dot blot hemorrhages; CWS cotton wool spot; POAG primary open angle glaucoma; C/D cup-to-disc ratio; HVF humphrey visual field; GVF goldmann visual field; OCT optical coherence tomography; IOP intraocular pressure; BRVO Branch retinal vein occlusion; CRVO central retinal vein occlusion; CRAO central retinal artery occlusion; BRAO branch retinal artery occlusion; RT retinal tear; SB scleral buckle; PPV pars plana vitrectomy; VH Vitreous hemorrhage; PRP panretinal laser  photocoagulation; IVK intravitreal kenalog; VMT vitreomacular traction; MH Macular hole;  NVD neovascularization of the disc; NVE neovascularization elsewhere; AREDS age related eye disease study; ARMD age related macular degeneration; POAG primary open angle glaucoma; EBMD epithelial/anterior basement membrane dystrophy; ACIOL anterior chamber intraocular lens; IOL intraocular lens; PCIOL posterior chamber intraocular lens; Phaco/IOL phacoemulsification with intraocular lens placement; [place] photorefractive keratectomy; LASIK laser assisted in situ keratomileusis; HTN hypertension; DM diabetes mellitus; COPD chronic obstructive pulmonary disease

## 2018-12-29 ENCOUNTER — Other Ambulatory Visit: Payer: Self-pay

## 2018-12-29 ENCOUNTER — Ambulatory Visit (INDEPENDENT_AMBULATORY_CARE_PROVIDER_SITE_OTHER): Payer: BC Managed Care – PPO | Admitting: Ophthalmology

## 2018-12-29 ENCOUNTER — Encounter (INDEPENDENT_AMBULATORY_CARE_PROVIDER_SITE_OTHER): Payer: Self-pay | Admitting: Ophthalmology

## 2018-12-29 DIAGNOSIS — H3581 Retinal edema: Secondary | ICD-10-CM

## 2018-12-29 DIAGNOSIS — H4312 Vitreous hemorrhage, left eye: Secondary | ICD-10-CM

## 2018-12-29 DIAGNOSIS — H3342 Traction detachment of retina, left eye: Secondary | ICD-10-CM | POA: Diagnosis not present

## 2018-12-29 DIAGNOSIS — H25813 Combined forms of age-related cataract, bilateral: Secondary | ICD-10-CM | POA: Diagnosis not present

## 2018-12-29 DIAGNOSIS — E103513 Type 1 diabetes mellitus with proliferative diabetic retinopathy with macular edema, bilateral: Secondary | ICD-10-CM | POA: Diagnosis not present

## 2018-12-29 DIAGNOSIS — H2513 Age-related nuclear cataract, bilateral: Secondary | ICD-10-CM

## 2018-12-29 DIAGNOSIS — E103313 Type 1 diabetes mellitus with moderate nonproliferative diabetic retinopathy with macular edema, bilateral: Secondary | ICD-10-CM

## 2018-12-29 DIAGNOSIS — H4313 Vitreous hemorrhage, bilateral: Secondary | ICD-10-CM | POA: Diagnosis not present

## 2018-12-29 DIAGNOSIS — H35033 Hypertensive retinopathy, bilateral: Secondary | ICD-10-CM

## 2018-12-29 DIAGNOSIS — I10 Essential (primary) hypertension: Secondary | ICD-10-CM

## 2018-12-29 DIAGNOSIS — H34813 Central retinal vein occlusion, bilateral, with macular edema: Secondary | ICD-10-CM

## 2018-12-31 ENCOUNTER — Encounter (INDEPENDENT_AMBULATORY_CARE_PROVIDER_SITE_OTHER): Payer: Self-pay | Admitting: Ophthalmology

## 2019-01-01 LAB — IOL BIOMETRY - OU - BOTH EYES
A LENGTH (OD): 23.99 mm
A LENGTH (OS): 24.1 mm

## 2019-01-01 NOTE — Telephone Encounter (Signed)
Dr. Radford Pax, the pt just started establishing care with PCP Dr. Ernie Hew, and has yet to have labs done with them. You can see in care everywhere she has been to several different facilities and had labs done.  Most recent that I can see is on 08/22/2017 with [facility].

## 2019-01-02 ENCOUNTER — Other Ambulatory Visit: Payer: Self-pay

## 2019-01-02 DIAGNOSIS — I10 Essential (primary) hypertension: Secondary | ICD-10-CM

## 2019-01-02 NOTE — Progress Notes (Signed)
Ordering a BMET per Dr. Anice Paganini, Barbara Hong, MD  Barbara Alpha, LPN 16 hours ago ([id number] PM)   Then she needs to come in for BMET before starting med

## 2019-01-03 ENCOUNTER — Other Ambulatory Visit: Payer: BC Managed Care – PPO

## 2019-01-04 ENCOUNTER — Other Ambulatory Visit: Payer: Self-pay

## 2019-01-04 ENCOUNTER — Other Ambulatory Visit: Payer: BC Managed Care – PPO | Admitting: *Deleted

## 2019-01-04 DIAGNOSIS — I10 Essential (primary) hypertension: Secondary | ICD-10-CM

## 2019-01-05 LAB — BASIC METABOLIC PANEL
BUN/Creatinine Ratio: 23 (ref 9–23)
BUN: 25 mg/dL — ABNORMAL HIGH (ref 6–20)
CO2: 21 mmol/L (ref 20–29)
Calcium: 8.7 mg/dL (ref 8.7–10.2)
Chloride: 100 mmol/L (ref 96–106)
Creatinine, Ser: 1.08 mg/dL — ABNORMAL HIGH (ref 0.57–1.00)
GFR calc Af Amer: 81 mL/min/{1.73_m2} (ref >59)
GFR calc non Af Amer: 70 mL/min/{1.73_m2} (ref >59)
Glucose: 344 mg/dL — ABNORMAL HIGH (ref 65–99)
Potassium: 4.5 mmol/L (ref 3.5–5.2)
Sodium: 134 mmol/L (ref 134–144)

## 2019-01-23 ENCOUNTER — Other Ambulatory Visit: Payer: Self-pay

## 2019-01-23 ENCOUNTER — Ambulatory Visit (INDEPENDENT_AMBULATORY_CARE_PROVIDER_SITE_OTHER): Payer: BC Managed Care – PPO | Admitting: General Practice

## 2019-01-23 VITALS — BP 168/96 | HR 96 | Ht 66.0 in | Wt 197.0 lb

## 2019-01-23 DIAGNOSIS — Z3042 Encounter for surveillance of injectable contraceptive: Secondary | ICD-10-CM

## 2019-01-23 MED ORDER — MEDROXYPROGESTERONE ACETATE 150 MG/ML IM SUSP
150.0000 mg | Freq: Once | INTRAMUSCULAR | Status: AC
  Administered 2019-01-23: 150 mg via INTRAMUSCULAR

## 2019-01-23 NOTE — Progress Notes (Signed)
Patient seen and assessed by nursing staff during this encounter. I have reviewed the chart and agree with the documentation and plan.  Marcille Buffy DNP, CNM  01/23/19  4:34 PM

## 2019-01-23 NOTE — Progress Notes (Signed)
[name] here for Depo-Provera  Injection.  Injection administered without complication. Patient will return in 3 months for next injection.  Derinda Late, RN 01/23/2019  3:26 PM

## 2019-01-24 ENCOUNTER — Ambulatory Visit: Payer: BC Managed Care – PPO | Admitting: Endocrinology

## 2019-01-25 ENCOUNTER — Encounter: Payer: Self-pay | Admitting: Endocrinology

## 2019-01-25 ENCOUNTER — Telehealth: Payer: Self-pay

## 2019-01-25 NOTE — Telephone Encounter (Signed)
Called patient to set up appt. LMTCB

## 2019-01-25 NOTE — Telephone Encounter (Signed)
Please call pt to schedule a VV

## 2019-01-26 ENCOUNTER — Encounter (INDEPENDENT_AMBULATORY_CARE_PROVIDER_SITE_OTHER): Payer: BC Managed Care – PPO | Admitting: Ophthalmology

## 2019-02-01 ENCOUNTER — Encounter: Payer: Self-pay | Admitting: Cardiology

## 2019-02-01 ENCOUNTER — Telehealth (INDEPENDENT_AMBULATORY_CARE_PROVIDER_SITE_OTHER): Payer: BC Managed Care – PPO | Admitting: Cardiology

## 2019-02-01 VITALS — BP 154/90 | HR 95

## 2019-02-01 DIAGNOSIS — R600 Localized edema: Secondary | ICD-10-CM

## 2019-02-01 DIAGNOSIS — E669 Obesity, unspecified: Secondary | ICD-10-CM | POA: Diagnosis not present

## 2019-02-01 DIAGNOSIS — I10 Essential (primary) hypertension: Secondary | ICD-10-CM

## 2019-02-01 MED ORDER — CHLORTHALIDONE 25 MG PO TABS: 25.0000 mg | ORAL_TABLET | Freq: Every day | ORAL | 3 refills | Status: DC

## 2019-02-01 NOTE — Telephone Encounter (Signed)
Scheduled patient from canceled appointment for 02/21/19

## 2019-02-01 NOTE — Patient Instructions (Addendum)
Medication Instructions:  Your physician has recommended you make the following change in your medication:   1) START taking chlorthalidone 25 mg daily   *If you need a refill on your cardiac medications before your next appointment, please call your pharmacy*  Lab Work: BMET on 02/08/19  If you have labs (blood work) drawn today and your tests are completely normal, you will receive your results only by: Marland Kitchen MyChart Message (if you have MyChart) OR . A paper copy in the mail If you have any lab test that is abnormal or we need to change your treatment, we will call you to review the results.  Follow-Up: At [facility], you and your health needs are our priority.  As part of our continuing mission to provide you with exceptional heart care, we have created designated Provider Care Teams.  These Care Teams include your primary Cardiologist (physician) and Advanced Practice Providers (APPs -  Physician Assistants and Nurse Practitioners) who all work together to provide you with the care you need, when you need it.  Your next appointment:   February 17th, 2021 at 1:15PM  The format for your next appointment:   In Person  Provider:   Ermalinda Barrios, PA-C  Other Instructions Please take your blood pressure daily for one week and call us with your readings.

## 2019-02-01 NOTE — Progress Notes (Signed)
Virtual Visit via Telephone Note   This visit type was conducted due to national recommendations for restrictions regarding the COVID-19 Pandemic (e.g. social distancing) in an effort to limit this patient's exposure and mitigate transmission in our community.  Due to her co-morbid illnesses, this patient is at least at moderate risk for complications without adequate follow up.  This format is felt to be most appropriate for this patient at this time.  The patient did not have access to video technology/had technical difficulties with video requiring transitioning to audio format only (telephone).  All issues noted in this document were discussed and addressed.  No physical exam could be performed with this format.  Please refer to the patient's chart for her  consent to telehealth for [facility].   Evaluation Performed:  Follow-up visit  This visit type was conducted due to national recommendations for restrictions regarding the COVID-19 Pandemic (e.g. social distancing).  This format is felt to be most appropriate for this patient at this time.  All issues noted in this document were discussed and addressed.  No physical exam was performed (except for noted visual exam findings with Video Visits).  Please refer to the patient's chart (MyChart message for video visits and phone note for telephone visits) for the patient's consent to telehealth for [facility].  Date:  02/01/2019   ID:  Barbara Donovan, DOB Jan 08, 1991, MRN [id number]  Patient Location:  Home  Provider location:   [place]  PCP:  Fanny Bien, MD  Cardiologist:  Fransico Him, MD  Electrophysiologist:  None   Chief Complaint:  HTN  History of Present Illness:    Barbara Donovan is a 29 y.o. female who presents via audio/video conferencing for a telehealth visit today.    This is a 29yo female with a hx of chronic HTN, DM on Insulin pump, morbid obesity,.  She is still having problems with LE edema.  She  tries to follow a low sodiunm diet.  She is here today for followup and is doing well.  She denies any chest pain or pressure, SOB, DOE (except with going up stairs), PND, orthopnea, dizziness, palpitations or syncope. She is compliant with her meds and is tolerating meds with no SE.    The patient does not have symptoms concerning for COVID-19 infection (fever, chills, cough, or new shortness of breath).    Prior CV studies:   The following studies were reviewed today:  2D Echo 2019  Past Medical History:  Diagnosis Date  . Asthma 10/30/2013  . Cataract    NS OU  . Chronic hypertension during pregnancy, antepartum 08/19/2017  . Dehydration 01/28/2018  . Depression during pregnancy, antepartum 07/07/2017   6/20: Short trial of zoloft previously, reports didn't help much but also didn't give it a chance Discussed r/b/a SSRIs in pregnancy, agrees to try Zoloft again, rx sent No SI/HI/red flags  . Diabetes ([place])    TYPE I  . 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  Marland Kitchen HTN (hypertension)   . Hypertensive retinopathy    OU  . Hypokalemia 01/22/2018  . Hypomagnesemia 01/28/2018  . Intractable nausea and vomiting 01/22/2018  . Intrauterine growth restriction (IUGR) affecting care of mother 12/22/2017  . Morbid obesity ([place])   . 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 Severe hyperemesis gravidarum 10/30/2017  .  Type I diabetes mellitus ([place]) 07/07/2017   Current Diabetic Medications:  Insulin  [x]  Aspirin 81 mg daily after 12 weeks (? A2/B GDM)  Required Referrals for A1GDM or A2GDM: [x]  Diabetes Education and Testing Supplies [x]  Nutrition Cousult  For A2/B GDM or higher classes of DM [x]  Diabetes Education and Testing Supplies [x]  Nutrition Counsult [x]  Fetal ECHO after 20 weeks  [x]  Eye exam for retina evaluation - severe  retinopathy 7/19  Base  . Ventricular septal defect (VSD) of fetus in singleton pregnancy, antepartum 09/30/2017   May go to newborn nursery per Dr. Lenard Simmer Echo prior to discharge   Past Surgical History:  Procedure Laterality Date  . [place] VITRECTOMY WITH 20 GAUGE MVR PORT FOR MACULAR HOLE Left 07/20/2018   Procedure: 25 GAUGE PARS PLANA VITRECTOMY LEFT EYE;  Surgeon: Bernarda Caffey, MD;  Location: [place];  Service: Ophthalmology;  Laterality: Left;  . CESAREAN SECTION N/A 12/26/2017   Procedure: CESAREAN SECTION;  Surgeon: Osborne Oman, MD;  Location: [place];  Service: Obstetrics;  Laterality: N/A;  . EYE EXAMINATION UNDER ANESTHESIA Right 07/20/2018   Procedure: Eye Exam Under Anesthesia RIGHT EYE;  Surgeon: Bernarda Caffey, MD;  Location: [place];  Service: Ophthalmology;  Laterality: Right;  . EYE SURGERY    . INJECTION OF SILICONE OIL Left 99991111   Procedure: Injection Of Silicone Oil LEFT EYE;  Surgeon: Bernarda Caffey, MD;  Location: [place];  Service: Ophthalmology;  Laterality: Left;  . LASER PHOTO ABLATION Right 07/20/2018   Procedure: Laser Photo Ablation RIGHT EYE;  Surgeon: Bernarda Caffey, MD;  Location: [place];  Service: Ophthalmology;  Laterality: Right;  . MEMBRANE PEEL Left 07/20/2018   Procedure: Membrane Peel LEFT EYE;  Surgeon: Bernarda Caffey, MD;  Location: [place];  Service: Ophthalmology;  Laterality: Left;  . MITOMYCIN C APPLICATION Bilateral 99991111   Procedure: Avastin Application;  Surgeon: Bernarda Caffey, MD;  Location: [place];  Service: Ophthalmology;  Laterality: Bilateral;  . NO PAST SURGERIES    . PHOTOCOAGULATION WITH LASER Left 07/20/2018   Procedure: Photocoagulation With Laser LEFT EYE;  Surgeon: Bernarda Caffey, MD;  Location: [place];  Service: Ophthalmology;  Laterality: Left;  . RETINAL DETACHMENT SURGERY Left 07/20/2018   Dr. Bernarda Caffey  . WISDOM TOOTH EXTRACTION       Current Meds  Medication Sig  . Blood Pressure Monitoring (OMRON 3 SERIES  BP MONITOR) DEVI 1 each by Does not apply route daily. Take daily blood pressure. Please report back to Dr. Radford Pax in 2 wks.  . brimonidine (ALPHAGAN) 0.2 % ophthalmic solution Place 1 drop into the left eye 2 (two) times daily.  . carvedilol (COREG) 25 MG tablet Take 1 tablet (25 mg total) by mouth 2 (two) times daily.  . Continuous Blood Gluc Sensor (DEXCOM G6 SENSOR) MISC 1 each by Does not apply route See admin instructions. For use with continuous glucose monitoring system. Change sensor every 10 days; E11.9  . Continuous Blood Gluc Transmit (DEXCOM G6 TRANSMITTER) MISC 1 each by Does not apply route See admin instructions. For continuous glucose monitoring; E11.9  . diltiazem (CARDIZEM CD) 120 MG 24 hr capsule Take 1 capsule (120 mg total) by mouth daily.  . furosemide (LASIX) 20 MG tablet Take 1 tablet (20 mg total) by mouth daily as needed (swelling).  Marland Kitchen glucose blood (ONETOUCH VERIO) test strip 1 each by Other route 5 (five) times daily. Use as instructed  . insulin aspart (NOVOLOG) 100 UNIT/ML injection  For use in pump, total of 60 units per day.  . Insulin Pen Needle (PEN NEEDLES) 32G X 5 MM MISC 1 Device by Does not apply route 4 (four) times daily.  . Multiple Vitamin (MULTIVITAMIN WITH MINERALS) TABS tablet Take 1 tablet by mouth daily.     Allergies:   Patient has no known allergies.   Social History   Tobacco Use  . Smoking status: Never Smoker  . Smokeless tobacco: Never Used  Substance Use Topics  . Alcohol use: Not Currently    Comment: SOCIALLY  . Drug use: Never     Family Hx: The patient's family history includes Aneurysm (age of onset: 19) in her mother; Cataracts in her father; Diabetes in her father and mother; Healthy in her brother, daughter, and sister. There is no history of Amblyopia, Blindness, Glaucoma, Macular degeneration, Retinal detachment, Strabismus, or Retinitis pigmentosa.  ROS:   Please see the history of present illness.     All other systems  reviewed and are negative.   Labs/Other Tests and Data Reviewed:    Recent Labs: 07/17/2018: Hemoglobin 11.9; Platelets 434 01/04/2019: BUN 25; Creatinine, Ser 1.08; Potassium 4.5; Sodium 134   Recent Lipid Panel No results found for: CHOL, TRIG, HDL, CHOLHDL, LDLCALC, LDLDIRECT  Wt Readings from Last 3 Encounters:  01/23/19 197 lb (89.4 kg)  12/13/18 186 lb (84.4 kg)  12/03/18 187 lb 3 oz (84.9 kg)     Objective:    Vital Signs:  BP (!) 154/90   Pulse 95     ASSESSMENT & PLAN:    1.  HTN -BP has been running 154/51mmHg and last week her SBP was 136mmHg at her PCPs office -encouraged her to follow a low Na < 2gm daily.  -Continue Carvedilol 25mg  BID and Cardizem CD 120mg  daily -add Chlorthalidone 25mg  daily -BP check daily for a week and call with the results -repeat BMET in 1 week -Followup with PA in office in 4 weeks  2.  LE edema -suspect this is related to dietary indiscretion with Na -I will send her a handout on low Na diet -PRN Lasix is not helping much -will start Chlorthalidone 25mg  daily to help with BP control and edema -Use Lasix PRN for additional edema -check BMET in 1 week -followup with PA in 4 weeks to make sure edema has improved  3.  Obesity -I have encouraged her to get into a routine exercise program and cut back on carbs and portions.    COVID-19 Education: The signs and symptoms of COVID-19 were discussed with the patient and how to seek care for testing (follow up with PCP or arrange E-visit).  The importance of social distancing was discussed today.  Patient Risk:   After full review of this patient's clinical status, I feel that they are at least moderate risk at this time.  Time:   Today, I have spent 20 minutes on telemedicine discussing medical problems including HTN, LE edema, Obesity.  We also reviewed the symptoms of COVID 19 and the ways to protect against contracting the virus with telehealth technology.  I spent an additional 5  minutes reviewing patient's chart including labs, 2D echo.  Medication Adjustments/Labs and Tests Ordered: Current medicines are reviewed at length with the patient today.  Concerns regarding medicines are outlined above.  Tests Ordered: No orders of the defined types were placed in this encounter.  Medication Changes: No orders of the defined types were placed in this encounter.   Disposition:  Follow up in 4 week(s) with PA and 6 months  Signed, Fransico Him, MD  02/01/2019 1:32 PM    [facility]

## 2019-02-04 ENCOUNTER — Other Ambulatory Visit: Payer: Self-pay | Admitting: Cardiology

## 2019-02-08 ENCOUNTER — Telehealth: Payer: Self-pay

## 2019-02-08 ENCOUNTER — Other Ambulatory Visit: Payer: BC Managed Care – PPO | Admitting: *Deleted

## 2019-02-08 ENCOUNTER — Other Ambulatory Visit: Payer: Self-pay

## 2019-02-08 DIAGNOSIS — E876 Hypokalemia: Secondary | ICD-10-CM

## 2019-02-08 DIAGNOSIS — I10 Essential (primary) hypertension: Secondary | ICD-10-CM

## 2019-02-08 DIAGNOSIS — E669 Obesity, unspecified: Secondary | ICD-10-CM

## 2019-02-08 DIAGNOSIS — R600 Localized edema: Secondary | ICD-10-CM

## 2019-02-08 LAB — BASIC METABOLIC PANEL
BUN/Creatinine Ratio: 25 — ABNORMAL HIGH (ref 9–23)
BUN: 35 mg/dL — ABNORMAL HIGH (ref 6–20)
CO2: 21 mmol/L (ref 20–29)
Calcium: 9 mg/dL (ref 8.7–10.2)
Chloride: 99 mmol/L (ref 96–106)
Creatinine, Ser: 1.42 mg/dL — ABNORMAL HIGH (ref 0.57–1.00)
GFR calc Af Amer: 58 mL/min/{1.73_m2} — ABNORMAL LOW (ref >59)
GFR calc non Af Amer: 50 mL/min/{1.73_m2} — ABNORMAL LOW (ref >59)
Glucose: 324 mg/dL — ABNORMAL HIGH (ref 65–99)
Potassium: 5.1 mmol/L (ref 3.5–5.2)
Sodium: 132 mmol/L — ABNORMAL LOW (ref 134–144)

## 2019-02-08 MED ORDER — DILTIAZEM HCL ER COATED BEADS 180 MG PO CP24: 180.0000 mg | ORAL_CAPSULE | Freq: Every day | ORAL | 3 refills | Status: DC

## 2019-02-08 NOTE — Telephone Encounter (Signed)
-----   Message from Sueanne Margarita, MD sent at 02/08/2019  4:52 PM EST ----- Creatinine bumped with chlorthalidone -please stop chlorthalidone and increase Cardizem CD to 180mg  daily. Check BP daily for a week and set up in PharmD HTN clinic in 1 week for BP followup and repeat BMET

## 2019-02-08 NOTE — Telephone Encounter (Signed)
The patient has been notified of the result and verbalized understanding.  All questions (if any) were answered. Labs and appointment with HTN clinic have been scheduled. Patient will send [name] daily BP readings for one week.  Antonieta Iba, RN 02/08/2019 5:05 PM

## 2019-02-09 ENCOUNTER — Encounter (INDEPENDENT_AMBULATORY_CARE_PROVIDER_SITE_OTHER): Payer: BC Managed Care – PPO | Admitting: Ophthalmology

## 2019-02-16 ENCOUNTER — Encounter: Payer: Self-pay | Admitting: Endocrinology

## 2019-02-19 ENCOUNTER — Other Ambulatory Visit: Payer: Self-pay | Admitting: Endocrinology

## 2019-02-19 ENCOUNTER — Other Ambulatory Visit: Payer: Self-pay

## 2019-02-19 ENCOUNTER — Ambulatory Visit (INDEPENDENT_AMBULATORY_CARE_PROVIDER_SITE_OTHER): Payer: BC Managed Care – PPO | Admitting: Pharmacist

## 2019-02-19 ENCOUNTER — Encounter: Payer: Self-pay | Admitting: Pharmacist

## 2019-02-19 ENCOUNTER — Other Ambulatory Visit: Payer: BC Managed Care – PPO | Admitting: *Deleted

## 2019-02-19 VITALS — BP 138/80 | HR 90

## 2019-02-19 DIAGNOSIS — E876 Hypokalemia: Secondary | ICD-10-CM

## 2019-02-19 DIAGNOSIS — E10319 Type 1 diabetes mellitus with unspecified diabetic retinopathy without macular edema: Secondary | ICD-10-CM

## 2019-02-19 DIAGNOSIS — I10 Essential (primary) hypertension: Secondary | ICD-10-CM

## 2019-02-19 MED ORDER — LANTUS SOLOSTAR 100 UNIT/ML ~~LOC~~ SOPN: 15.0000 [IU] | PEN_INJECTOR | Freq: Every day | SUBCUTANEOUS | 99 refills | Status: DC

## 2019-02-19 MED ORDER — BASAGLAR KWIKPEN 100 UNIT/ML ~~LOC~~ SOPN: 15.0000 [IU] | PEN_INJECTOR | Freq: Every day | SUBCUTANEOUS | 0 refills | Status: DC

## 2019-02-19 NOTE — Patient Instructions (Addendum)
It was a pleasure to meet you!   Your blood pressure today in clinic was 138/80.  Your blood pressure goal is <130/80  I will all you tomorrow with your lab results and we will review your medication options.  Try to avoid foods high in sodium like deli meats, certain sauces and dressings (always check the label)   Before checking your blood pressure make sure: You are seated and quite for 5 min before checking Feet are flat on the floor Siting in chair with your back supported straight up and down Arm resting on table or arm of chair at heart level Bladder is empty You have NOT had caffeine or tobacco within the last 30 min  Check your blood pressure 2-3 times about 1-2 min apart. Usually the first reading will be the highest. Record these readings.  Only check your blood pressure once a day, unless otherwise directed Record your blood pressure readings and bring them to all your appointments. If your meter stores your readings in its memory, then you may bring your blood pressure meter with you to your appointments.  Lifestyle changes can make a world of difference and are even more important than medications: Try to keep your sodium intake to 2300 mg of sodium per day Get 6-8 uninterrupted hours of sleep per night Aim for a goal of 150 min of moderate aerobic exercise (ie brisk walking, bike riding) per week

## 2019-02-19 NOTE — Progress Notes (Signed)
Patient ID: RAMEEN CHRYST                 DOB: 1990/05/24                      MRN: [id number]     HPI: Barbara Donovan is a 29 y.o. female referred by Dr. Radford Pax to HTN clinic. PMH is significant for HTN, type 1 DM with associated retinopathy, asthma, and obesity. Appears patient also has a history of HTN with associated elevated heart rates. Pt had a virtual visit with Dr Radford Pax 2 weeks ago, BP was elevated at 154/90, and pt reported LE edema. She was started on chlorthalidone 25mg  daily, follow up BMET 1 week later showed SCr increase from 1.08 to 1.42. Chlorthalidone was stopped and Cardizem was increased from 120mg  to 180mg  daily. Pt was referred to HTN clinic for follow up as well as BMET.  Patient presents today to clinic. She has also gone to lab for follow up BMP. She states that he LLE edema has been much better over the last week. She has not had to use furosemide. She states she started a new job last week at the [employer] and she is doing a lot more walking. She also states she feels a little less stressed. She has been sleeping better with the exception of last night where her son did not want to go to sleep until 1 AM.  She states she has an exercise bike, but she finds it hard to use because her 73 year old son tries to climb on it and the resistance does not work.  Patient states that she is on birth control and does not plan to get pregnant. She was advised to let us know if she plans to start trying to get pregnant. She is trying to cut out carbs and tries to watch her sodium.  She denies dizziness, lightheadedness, headache, blurred vision, SOB (some SOB on walking up 4 flight of steps), or swelling.  Current HTN meds: carvedilol 25mg  BID, diltiazem 180mg  daily, Lasix 20mg  prn edema Previously tried: enalapril, chlorthalidone - increase in SCr/hyponatermia, HCTZ (persistent hypokalemia and hypomagnesemia), amlodipine (unsure), labatolol (changed to another med), Nifedipine  (changed to another medication), spironolactone BP goal: <130/80  Family History: The patient's family history includes Aneurysm (age of onset: 88) in her mother; Cataracts in her father; Diabetes in her father and mother; Healthy in her brother, daughter, and sister. There is no history of Amblyopia, Blindness, Glaucoma, Macular degeneration, Retinal detachment, Strabismus, or Retinitis pigmentosa.  Social History: Denies tobacco and illicit drug use, social alcohol use.  Diet: breakfast: bistro-grapes, cheese, apples or avocado w/ boiled egg Starfish schereded chicken, trying to eat more salads Meatloaf w/ broccoli Cauliflower rice, rudabega Doesn't use salt-uses mrs dash, onion/garlic powder [place], chicken Mainly drinks water-sometimes coke zero  Exercise: has exercise bike  Home BP readings: HR 80's 154/92 this AM HR 99  Wt Readings from Last 3 Encounters:  01/23/19 197 lb (89.4 kg)  12/13/18 186 lb (84.4 kg)  12/03/18 187 lb 3 oz (84.9 kg)   BP Readings from Last 3 Encounters:  02/01/19 (!) 154/90  01/23/19 (!) 168/96  12/13/18 (!) 150/90   Pulse Readings from Last 3 Encounters:  02/01/19 95  01/23/19 96  12/13/18 88    Renal function: CrCl cannot be calculated (Unknown ideal weight.).  Past Medical History:  Diagnosis Date  . Asthma 10/30/2013  . Cataract  NS OU  . Chronic hypertension during pregnancy, antepartum 08/19/2017  . Dehydration 01/28/2018  . Depression during pregnancy, antepartum 07/07/2017   6/20: Short trial of zoloft previously, reports didn't help much but also didn't give it a chance Discussed r/b/a SSRIs in pregnancy, agrees to try Zoloft again, rx sent No SI/HI/red flags  . Diabetes ([place])    TYPE I  . 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  Marland Kitchen HTN (hypertension)   . Hypertensive retinopathy    OU  . Hypokalemia 01/22/2018  . Hypomagnesemia 01/28/2018  .  Intractable nausea and vomiting 01/22/2018  . Intrauterine growth restriction (IUGR) affecting care of mother 12/22/2017  . Morbid obesity ([place])   . 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 Severe hyperemesis gravidarum 10/30/2017  . Type I diabetes mellitus ([place]) 07/07/2017   Current Diabetic Medications:  Insulin  [x]  Aspirin 81 mg daily after 12 weeks (? A2/B GDM)  Required Referrals for A1GDM or A2GDM: [x]  Diabetes Education and Testing Supplies [x]  Nutrition Cousult  For A2/B GDM or higher classes of DM [x]  Diabetes Education and Testing Supplies [x]  Nutrition Counsult [x]  Fetal ECHO after 20 weeks  [x]  Eye exam for retina evaluation - severe retinopathy 7/19  Base  . Ventricular septal defect (VSD) of fetus in singleton pregnancy, antepartum 09/30/2017   May go to newborn nursery per Dr. Lenard Simmer Echo prior to discharge    Current Outpatient Medications on File Prior to Visit  Medication Sig Dispense Refill  . Blood Pressure Monitoring (OMRON 3 SERIES BP MONITOR) DEVI 1 each by Does not apply route daily. Take daily blood pressure. Please report back to Dr. Radford Pax in 2 wks. 1 Device 0  . brimonidine (ALPHAGAN) 0.2 % ophthalmic solution Place 1 drop into the left eye 2 (two) times daily.    . carvedilol (COREG) 25 MG tablet Take 1 tablet (25 mg total) by mouth 2 (two) times daily. 180 tablet 3  . chlorthalidone (HYGROTON) 25 MG tablet Take 1 tablet (25 mg total) by mouth daily. 90 tablet 3  . Continuous Blood Gluc Sensor (DEXCOM G6 SENSOR) MISC 1 each by Does not apply route See admin instructions. For use with continuous glucose monitoring system. Change sensor every 10 days; E11.9 3 each 2  . Continuous Blood Gluc Transmit (DEXCOM G6 TRANSMITTER) MISC 1 each by Does not apply route See admin instructions. For continuous glucose monitoring; E11.9 1 each 2  . diltiazem (CARDIZEM CD) 180 MG 24 hr capsule Take 1  capsule (180 mg total) by mouth daily. 90 capsule 3  . furosemide (LASIX) 20 MG tablet TAKE 1 TABLET BY MOUTH EVERY DAY AS NEEDED 180 tablet 3  . glucose blood (ONETOUCH VERIO) test strip 1 each by Other route 5 (five) times daily. Use as instructed 200 each 2  . insulin aspart (NOVOLOG) 100 UNIT/ML injection For use in pump, total of 60 units per day. 20 mL 11  . Insulin Pen Needle (PEN NEEDLES) 32G X 5 MM MISC 1 Device by Does not apply route 4 (four) times daily. 120 each 11  . methocarbamol (ROBAXIN) 500 MG tablet Take 1 tablet (500 mg total) by mouth 2 (two) times daily. (Patient not taking: Reported on 02/01/2019) 20 tablet 0  . Multiple Vitamin (MULTIVITAMIN WITH MINERALS) TABS tablet Take 1 tablet by mouth daily.    Marland Kitchen  sertraline (ZOLOFT) 25 MG tablet Take 1 tablet (25 mg total) by mouth daily. (Patient not taking: Reported on 12/18/2018) 90 tablet 3  . sertraline (ZOLOFT) 50 MG tablet Take 25-50 mg by mouth daily.     No current facility-administered medications on file prior to visit.    No Known Allergies   Assessment/Plan:  1. Hypertension - Blood pressure is above goal of <130/80 in clinic today. Blood pressure did improve after patient used the rest room to 138/80. We had a long discussion about lifestyle modifications. Encouraged patient to start going for walks with her son in the stroller. Discussed the effects that non-pharmacological methods can have on blood pressure. She was provided a handout of these. We discussed reducing sodium from diet, including avoiding all deli meats (instead prepping chicken/tukery breast fresh for the week to slice), avoiding canned items (including canned chicken she was eating) and watching the sodium content of salad dressings/sauces. Will await results on BMP before adding additional blood pressure medications. As long as renal function has improved, will plan to start low dose irbesartan 75mg  daily. I will call patient tomorrow to discuss  results. Follow up in clinic in 5 weeks (patient has appointment that day and would allow her to only have to take 1 day off of work).  Thank you,  Ramond Dial, Pharm.D, BCPS, CPP [place]  [id number], [place], [place] [address]  Phone: [phone]; Fax: [phone]

## 2019-02-20 ENCOUNTER — Telehealth: Payer: Self-pay | Admitting: Pharmacist

## 2019-02-20 DIAGNOSIS — I10 Essential (primary) hypertension: Secondary | ICD-10-CM

## 2019-02-20 LAB — BASIC METABOLIC PANEL
BUN/Creatinine Ratio: 24 — ABNORMAL HIGH (ref 9–23)
BUN: 28 mg/dL — ABNORMAL HIGH (ref 6–20)
CO2: 19 mmol/L — ABNORMAL LOW (ref 20–29)
Calcium: 8.4 mg/dL — ABNORMAL LOW (ref 8.7–10.2)
Chloride: 104 mmol/L (ref 96–106)
Creatinine, Ser: 1.15 mg/dL — ABNORMAL HIGH (ref 0.57–1.00)
GFR calc Af Amer: 75 mL/min/{1.73_m2} (ref >59)
GFR calc non Af Amer: 65 mL/min/{1.73_m2} (ref >59)
Glucose: 290 mg/dL — ABNORMAL HIGH (ref 65–99)
Potassium: 4.3 mmol/L (ref 3.5–5.2)
Sodium: 135 mmol/L (ref 134–144)

## 2019-02-20 MED ORDER — IRBESARTAN 75 MG PO TABS: 75.0000 mg | ORAL_TABLET | Freq: Every day | ORAL | 11 refills | Status: DC

## 2019-02-20 NOTE — Telephone Encounter (Signed)
Called patient a reviewed her BMP results. Scr has improved. Will start low dose irbesartan 75mg  daily. Will repeat BMP in 1-2 weeks. Patient did not have her schedule with her and stated she would call back to schedule.

## 2019-02-21 ENCOUNTER — Other Ambulatory Visit: Payer: Self-pay

## 2019-02-21 ENCOUNTER — Ambulatory Visit: Payer: BC Managed Care – PPO | Admitting: Endocrinology

## 2019-02-21 ENCOUNTER — Encounter: Payer: Self-pay | Admitting: Endocrinology

## 2019-02-21 VITALS — BP 138/70 | HR 86 | Ht 66.0 in | Wt 194.6 lb

## 2019-02-21 DIAGNOSIS — E10319 Type 1 diabetes mellitus with unspecified diabetic retinopathy without macular edema: Secondary | ICD-10-CM

## 2019-02-21 LAB — POCT GLYCOSYLATED HEMOGLOBIN (HGB A1C): Hemoglobin A1C: 11.3 % — AB (ref 4.0–5.6)

## 2019-02-21 NOTE — Progress Notes (Signed)
Subjective:    Patient ID: Barbara Donovan, female    DOB: 1990/04/25, 29 y.o.   MRN: [id number]  HPI Pt returns for f/u of diabetes mellitus: DM type: 1 Dx'ed: 123456 Complications: PDR Therapy: insulin since 2009, and pump rx since 2020.  DKA: last episode was 2015.  Severe hypoglycemia: never Pancreatitis: never Pancreatic imaging: never.  Other: she takes multiple daily injections; she took pump rx during 2019 pregnancy, but not continuous glucose monitor, due to cost.  Interval history:  She takes these settings:  basal rate of 2.4 unit/hr, 7 AM-9 PM, and 0.8 units/hr overnight.   No meal bolus.  correction bolus (which some people call "sensitivity," or "insulin sensitivity ratio," or just "isr") of 1 unit for each 30 by which your glucose exceeds 100. TDD is 56 units (70% basal, 4% meal bolus, and 22% correction bolus.   Meter is downloaded today, and the printout is scanned into the record.  Glucose varies from 160-600.  There is no trend throughout the day, but it is in general lowest when a meal is missed.  Pt says she is struggling with operating the pump.  Main symptom is depression (denies SI).  She went to multiple daily injections 2 days ago, due to the pump problems.   Past Medical History:  Diagnosis Date  . Asthma 10/30/2013  . Cataract    NS OU  . Chronic hypertension during pregnancy, antepartum 08/19/2017  . Dehydration 01/28/2018  . Depression during pregnancy, antepartum 07/07/2017   6/20: Short trial of zoloft previously, reports didn't help much but also didn't give it a chance Discussed r/b/a SSRIs in pregnancy, agrees to try Zoloft again, rx sent No SI/HI/red flags  . Diabetes ([place])    TYPE I  . 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  Marland Kitchen HTN (hypertension)   . Hypertensive retinopathy    OU  . Hypokalemia 01/22/2018  . Hypomagnesemia 01/28/2018  . Intractable nausea and  vomiting 01/22/2018  . Intrauterine growth restriction (IUGR) affecting care of mother 12/22/2017  . Morbid obesity ([place])   . 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 Severe hyperemesis gravidarum 10/30/2017  . Type I diabetes mellitus ([place]) 07/07/2017   Current Diabetic Medications:  Insulin  [x]  Aspirin 81 mg daily after 12 weeks (? A2/B GDM)  Required Referrals for A1GDM or A2GDM: [x]  Diabetes Education and Testing Supplies [x]  Nutrition Cousult  For A2/B GDM or higher classes of DM [x]  Diabetes Education and Testing Supplies [x]  Nutrition Counsult [x]  Fetal ECHO after 20 weeks  [x]  Eye exam for retina evaluation - severe retinopathy 7/19  Base  . Ventricular septal defect (VSD) of fetus in singleton pregnancy, antepartum 09/30/2017   May go to newborn nursery per Dr. Lenard Simmer Echo prior to discharge    Past Surgical History:  Procedure Laterality Date  . [place] VITRECTOMY WITH 20 GAUGE MVR PORT FOR MACULAR HOLE Left 07/20/2018   Procedure: 25 GAUGE PARS PLANA VITRECTOMY LEFT EYE;  Surgeon: Bernarda Caffey, MD;  Location: [facility];  Service: Ophthalmology;  Laterality: Left;  . CESAREAN SECTION N/A 12/26/2017   Procedure: CESAREAN SECTION;  Surgeon: Osborne Oman, MD;  Location: [facility];  Service: Obstetrics;  Laterality: N/A;  . EYE EXAMINATION UNDER ANESTHESIA Right 07/20/2018   Procedure: Eye Exam Under Anesthesia RIGHT EYE;  Surgeon: Bernarda Caffey, MD;  Location: [facility];  Service: Ophthalmology;  Laterality: Right;  . EYE SURGERY    . INJECTION OF SILICONE OIL Left 99991111   Procedure: Injection Of Silicone Oil LEFT EYE;  Surgeon: Bernarda Caffey, MD;  Location: [place] [facility] [place];  Service: Ophthalmology;  Laterality: Left;  . LASER PHOTO ABLATION Right 07/20/2018   Procedure: Laser Photo Ablation RIGHT EYE;  Surgeon: Bernarda Caffey, MD;  Location: [place] [facility];  Service: Ophthalmology;  Laterality: Right;  .  MEMBRANE PEEL Left 07/20/2018   Procedure: Membrane Peel LEFT EYE;  Surgeon: Bernarda Caffey, MD;  Location: [facility];  Service: Ophthalmology;  Laterality: Left;  . MITOMYCIN C APPLICATION Bilateral 99991111   Procedure: Avastin Application;  Surgeon: Bernarda Caffey, MD;  Location: [place];  Service: Ophthalmology;  Laterality: Bilateral;  . NO PAST SURGERIES    . PHOTOCOAGULATION WITH LASER Left 07/20/2018   Procedure: Photocoagulation With Laser LEFT EYE;  Surgeon: Bernarda Caffey, MD;  Location: [facility];  Service: Ophthalmology;  Laterality: Left;  . RETINAL DETACHMENT SURGERY Left 07/20/2018   Dr. Bernarda Caffey  . WISDOM TOOTH EXTRACTION      Social History   Socioeconomic History  . Marital status: Single    Spouse name: Not on file  . Number of children: Not on file  . Years of education: Not on file  . Highest education level: Not on file  Occupational History  . Not on file  Tobacco Use  . Smoking status: Never Smoker  . Smokeless tobacco: Never Used  Substance and Sexual Activity  . Alcohol use: Not Currently    Comment: SOCIALLY  . Drug use: Never  . Sexual activity: Not Currently    Birth control/protection: None  Other Topics Concern  . Not on file  Social History Narrative  . Not on file   Social Determinants of Health   Financial Resource Strain:   . Difficulty of Paying Living Expenses: Not on file  Food Insecurity:   . Worried About Charity fundraiser in the Last Year: Not on file  . Ran Out of Food in the Last Year: Not on file  Transportation Needs:   . Lack of Transportation (Medical): Not on file  . Lack of Transportation (Non-Medical): Not on file  Physical Activity:   . Days of Exercise per Week: Not on file  . Minutes of Exercise per Session: Not on file  Stress:   . Feeling of Stress : Not on file  Social Connections:   . Frequency of Communication with Friends and Family: Not on file  . Frequency of Social Gatherings with Friends and Family: Not on file    . Attends Religious Services: Not on file  . Active Member of Clubs or Organizations: Not on file  . Attends Archivist Meetings: Not on file  . Marital Status: Not on file  Intimate Partner Violence:   . Fear of Current or Ex-Partner: Not on file  . Emotionally Abused: Not on file  . Physically Abused: Not on file  . Sexually Abused: Not on file    Current Outpatient Medications on File Prior to Visit  Medication Sig Dispense Refill  . Blood Pressure Monitoring (OMRON 3 SERIES BP MONITOR) DEVI 1 each by Does not apply route daily. Take daily blood pressure. Please report back to Dr. Radford Pax in 2 wks. 1 Device 0  . diltiazem (CARDIZEM CD) 180 MG 24 hr capsule Take 1 capsule (180 mg total) by mouth daily. Yoe  capsule 3  . furosemide (LASIX) 20 MG tablet TAKE 1 TABLET BY MOUTH EVERY DAY AS NEEDED 180 tablet 3  . glucose blood (ONETOUCH VERIO) test strip 1 each by Other route 5 (five) times daily. Use as instructed 200 each 2  . insulin aspart (NOVOLOG) 100 UNIT/ML injection For use in pump, total of 60 units per day. 20 mL 11  . Insulin Glargine (BASAGLAR KWIKPEN) 100 UNIT/ML SOPN Inject 0.15 mLs (15 Units total) into the skin daily. 13.5 mL 0  . Insulin Pen Needle (PEN NEEDLES) 32G X 5 MM MISC 1 Device by Does not apply route 4 (four) times daily. 120 each 11  . irbesartan (AVAPRO) 75 MG tablet Take 1 tablet (75 mg total) by mouth daily. 30 tablet 11  . Multiple Vitamin (MULTIVITAMIN WITH MINERALS) TABS tablet Take 1 tablet by mouth daily.    . OMEGA-3 FATTY ACIDS PO Take 2 capsules by mouth daily.    . sertraline (ZOLOFT) 25 MG tablet Take 1 tablet (25 mg total) by mouth daily. 90 tablet 3  . carvedilol (COREG) 25 MG tablet Take 1 tablet (25 mg total) by mouth 2 (two) times daily. 180 tablet 3   No current facility-administered medications on file prior to visit.    No Known Allergies  Family History  Problem Relation Age of Onset  . Diabetes Mother   . Aneurysm Mother 40   . Diabetes Father   . Cataracts Father   . Healthy Sister   . Healthy Brother   . Healthy Daughter   . Amblyopia Neg Hx   . Blindness Neg Hx   . Glaucoma Neg Hx   . Macular degeneration Neg Hx   . Retinal detachment Neg Hx   . Strabismus Neg Hx   . Retinitis pigmentosa Neg Hx     BP 138/70 (BP Location: Left Arm, Patient Position: Sitting, Cuff Size: Large)   Pulse 86   Ht 5\' 6"  (1.676 m)   Wt 194 lb 9.6 oz (88.3 kg)   SpO2 99%   BMI 31.41 kg/m    Review of Systems She denies hypoglycemia    Objective:   Physical Exam VITAL SIGNS:  See vs page GENERAL: no distress Pulses: dorsalis pedis intact bilat.   MSK: no deformity of the feet CV: 2+ bilat leg edema.   Skin:  no ulcer on the feet.  normal color and temp on the feet. Neuro: sensation is intact to touch on the feet.   Lab Results  Component Value Date   HGBA1C 11.3 (A) 02/21/2019       Assessment & Plan:  Type 1 DM, with PDR: worse HTN: is noted today Device malfunction: TDD is less than rx'ed.   Depression: This may be compromising rx of DM  Patient Instructions  Your blood pressure is high today.  Please see a primary care provider soon, to have it rechecked.   please continue these pump settings: basal rate of 2.4 unit/hr, 7 AM-9 PM, and 0.8 units/hr overnight.   No meal bolus.  correction bolus (which some people call "sensitivity," or "insulin sensitivity ratio," or just "isr") of 1 unit for each 30 by which your glucose exceeds 100.   On this type of pump schedule, you should eat meals on a regular schedule.  If a meal is missed or significantly delayed, your blood sugar could go low.   check your blood sugar 5 times a day: before the 3 meals, and at bedtime.  also check  if you have symptoms of your blood sugar being too high or too low.  please keep a record of the readings and bring it to your next appointment here (or you can bring the meter itself).  You can write it on any piece of paper.  please  call us sooner if your blood sugar goes below 70, or if you have a lot of readings over 200.   Please continue to work with Dr Ernie Hew, on the depression.  Please see Vaughan Basta, about the pump problems. If the pump does not work out, let's go back to the injections Please come back for a follow-up appointment in 2 months.

## 2019-02-21 NOTE — Patient Instructions (Addendum)
Your blood pressure is high today.  Please see a primary care provider soon, to have it rechecked.   please continue these pump settings: basal rate of 2.4 unit/hr, 7 AM-9 PM, and 0.8 units/hr overnight.   No meal bolus.  correction bolus (which some people call "sensitivity," or "insulin sensitivity ratio," or just "isr") of 1 unit for each 30 by which your glucose exceeds 100.   On this type of pump schedule, you should eat meals on a regular schedule.  If a meal is missed or significantly delayed, your blood sugar could go low.   check your blood sugar 5 times a day: before the 3 meals, and at bedtime.  also check if you have symptoms of your blood sugar being too high or too low.  please keep a record of the readings and bring it to your next appointment here (or you can bring the meter itself).  You can write it on any piece of paper.  please call us sooner if your blood sugar goes below 70, or if you have a lot of readings over 200.   Please continue to work with Dr Ernie Hew, on the depression.  Please see Vaughan Basta, about the pump problems. If the pump does not work out, let's go back to the injections Please come back for a follow-up appointment in 2 months.

## 2019-03-06 NOTE — Progress Notes (Signed)
Cardiology Office Note    Date:  03/07/2019   ID:  Barbara Donovan, DOB 1990/04/10, MRN [id number]  PCP:  Fanny Bien, MD  Cardiologist: Fransico Him, MD EPS: None  Chief Complaint  Patient presents with  . Follow-up    History of Present Illness:  Barbara Donovan is a 29 y.o. female with history of hypertension, DM on insulin pump, morbid obesity.  Had a telemedicine visit with Dr. Radford Pax 02/01/2019 at which time she was still having trouble with lower extremity edema.  Blood pressure was running high and chlorthalidone 25 mg daily was added.  Low Sodium diet recommended.  Patient's creatinine increased from 1.08-1.42 so chlorthalidone was stopped and Cardizem was increased from 120 mg daily to 180 mg daily.  She was referred to the hypertension clinic.  Her edema had improved.  Creatinine had improved so irbesartan 75 mg daily was started.  Patient comes in for f/u. BP still running high. Mother died 63 seizures and DM, father died MI 73. Upset she isn't seeing results. No regular exercise. Is watching salt intake.  Past Medical History:  Diagnosis Date  . Asthma 10/30/2013  . Cataract    NS OU  . Chronic hypertension during pregnancy, antepartum 08/19/2017  . Dehydration 01/28/2018  . Depression during pregnancy, antepartum 07/07/2017   6/20: Short trial of zoloft previously, reports didn't help much but also didn't give it a chance Discussed r/b/a SSRIs in pregnancy, agrees to try Zoloft again, rx sent No SI/HI/red flags  . Diabetes ([place])    TYPE I  . 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  Marland Kitchen HTN (hypertension)   . Hypertensive retinopathy    OU  . Hypokalemia 01/22/2018  . Hypomagnesemia 01/28/2018  . Intractable nausea and vomiting 01/22/2018  . Intrauterine growth restriction (IUGR) affecting care of mother 12/22/2017  . Morbid obesity ([place])   . 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 Severe hyperemesis gravidarum 10/30/2017  . Type I diabetes mellitus ([place]) 07/07/2017   Current Diabetic Medications:  Insulin  [x]  Aspirin 81 mg daily after 12 weeks (? A2/B GDM)  Required Referrals for A1GDM or A2GDM: [x]  Diabetes Education and Testing Supplies [x]  Nutrition Cousult  For A2/B GDM or higher classes of DM [x]  Diabetes Education and Testing Supplies [x]  Nutrition Counsult [x]  Fetal ECHO after 20 weeks  [x]  Eye exam for retina evaluation - severe retinopathy 7/19  Base  . Ventricular septal defect (VSD) of fetus in singleton pregnancy, antepartum 09/30/2017   May go to newborn nursery per Dr. Lenard Simmer Echo prior to discharge    Past Surgical History:  Procedure Laterality Date  . [place] VITRECTOMY WITH 20 GAUGE MVR PORT FOR MACULAR HOLE Left 07/20/2018   Procedure: 25 GAUGE PARS PLANA VITRECTOMY LEFT EYE;  Surgeon: Bernarda Caffey, MD;  Location: [facility];  Service: Ophthalmology;  Laterality: Left;  . CESAREAN SECTION N/A 12/26/2017   Procedure: CESAREAN SECTION;  Surgeon: Osborne Oman, MD;  Location: [facility];  Service: Obstetrics;  Laterality: N/A;  . EYE EXAMINATION UNDER ANESTHESIA Right 07/20/2018   Procedure: Eye Exam Under Anesthesia RIGHT EYE;  Surgeon: Bernarda Caffey, MD;  Location: [facility];  Service: Ophthalmology;  Laterality: Right;  . EYE SURGERY    . INJECTION OF SILICONE OIL Left 99991111   Procedure: Injection Of  Silicone Oil LEFT EYE;  Surgeon: Bernarda Caffey, MD;  Location: [facility];  Service: Ophthalmology;  Laterality: Left;  . LASER PHOTO ABLATION Right 07/20/2018   Procedure: Laser Photo Ablation RIGHT EYE;  Surgeon: Bernarda Caffey, MD;  Location: [facility];  Service: Ophthalmology;  Laterality: Right;  . MEMBRANE PEEL Left 07/20/2018   Procedure: Membrane Peel LEFT EYE;  Surgeon: Bernarda Caffey, MD;  Location: [facility];  Service: Ophthalmology;  Laterality:  Left;  . MITOMYCIN C APPLICATION Bilateral 99991111   Procedure: Avastin Application;  Surgeon: Bernarda Caffey, MD;  Location: [facility];  Service: Ophthalmology;  Laterality: Bilateral;  . NO PAST SURGERIES    . PHOTOCOAGULATION WITH LASER Left 07/20/2018   Procedure: Photocoagulation With Laser LEFT EYE;  Surgeon: Bernarda Caffey, MD;  Location: [facility];  Service: Ophthalmology;  Laterality: Left;  . RETINAL DETACHMENT SURGERY Left 07/20/2018   Dr. Bernarda Caffey  . WISDOM TOOTH EXTRACTION      Current Medications: Current Meds  Medication Sig  . Blood Pressure Monitoring (OMRON 3 SERIES BP MONITOR) DEVI 1 each by Does not apply route daily. Take daily blood pressure. Please report back to Dr. Radford Pax in 2 wks.  . carvedilol (COREG) 25 MG tablet Take 1 tablet (25 mg total) by mouth 2 (two) times daily.  Marland Kitchen diltiazem (CARDIZEM CD) 180 MG 24 hr capsule Take 1 capsule (180 mg total) by mouth daily.  . furosemide (LASIX) 20 MG tablet TAKE 1 TABLET BY MOUTH EVERY DAY AS NEEDED  . glucose blood (ONETOUCH VERIO) test strip 1 each by Other route 5 (five) times daily. Use as instructed  . insulin aspart (NOVOLOG) 100 UNIT/ML injection For use in pump, total of 60 units per day.  . Insulin Glargine (BASAGLAR KWIKPEN) 100 UNIT/ML SOPN Inject 0.15 mLs (15 Units total) into the skin daily.  . Insulin Pen Needle (PEN NEEDLES) 32G X 5 MM MISC 1 Device by Does not apply route 4 (four) times daily.  . irbesartan (AVAPRO) 75 MG tablet Take 1 tablet (75 mg total) by mouth daily.  . Multiple Vitamin (MULTIVITAMIN WITH MINERALS) TABS tablet Take 1 tablet by mouth daily.  . OMEGA-3 FATTY ACIDS PO Take 2 capsules by mouth daily.  . sertraline (ZOLOFT) 25 MG tablet Take 1 tablet (25 mg total) by mouth daily.  . [DISCONTINUED] carvedilol (COREG) 25 MG tablet Take 1 tablet (25 mg total) by mouth 2 (two) times daily.     Allergies:   Patient has no known allergies.   Social History   Socioeconomic History  . Marital  status: Single    Spouse name: Not on file  . Number of children: Not on file  . Years of education: Not on file  . Highest education level: Not on file  Occupational History  . Not on file  Tobacco Use  . Smoking status: Never Smoker  . Smokeless tobacco: Never Used  Substance and Sexual Activity  . Alcohol use: Not Currently    Comment: SOCIALLY  . Drug use: Never  . Sexual activity: Not Currently    Birth control/protection: None  Other Topics Concern  . Not on file  Social History Narrative  . Not on file   Social Determinants of Health   Financial Resource Strain:   . Difficulty of Paying Living Expenses: Not on file  Food Insecurity:   . Worried About Charity fundraiser in the Last Year: Not on file  . Ran Out of Food in the Last Year: Not  on file  Transportation Needs:   . Lack of Transportation (Medical): Not on file  . Lack of Transportation (Non-Medical): Not on file  Physical Activity:   . Days of Exercise per Week: Not on file  . Minutes of Exercise per Session: Not on file  Stress:   . Feeling of Stress : Not on file  Social Connections:   . Frequency of Communication with Friends and Family: Not on file  . Frequency of Social Gatherings with Friends and Family: Not on file  . Attends Religious Services: Not on file  . Active Member of Clubs or Organizations: Not on file  . Attends Archivist Meetings: Not on file  . Marital Status: Not on file     Family History:  The patient's   family history includes Aneurysm (age of onset: 30) in her mother; Cataracts in her father; Diabetes in her father and mother; Healthy in her brother, daughter, and sister.   ROS:   Please see the history of present illness.    ROS All other systems reviewed and are negative.   PHYSICAL EXAM:   VS:  BP (!) 146/90   Pulse 84   Ht 5\' 6"  (1.676 m)   Wt 199 lb (90.3 kg)   SpO2 99%   BMI 32.12 kg/m   Physical Exam  GEN: Well nourished, well developed, in no  acute distress  Neck: no JVD, carotid bruits, or masses Cardiac:RRR; no murmurs, rubs, or gallops  Respiratory:  clear to auscultation bilaterally, normal work of breathing GI: soft, nontender, nondistended, + BS Ext: without cyanosis, clubbing, or edema, Good distal pulses bilaterally Neuro:  Alert and Oriented x 3 Psych: euthymic mood, full affect  Wt Readings from Last 3 Encounters:  03/07/19 199 lb (90.3 kg)  02/21/19 194 lb 9.6 oz (88.3 kg)  01/23/19 197 lb (89.4 kg)      Studies/Labs Reviewed:   EKG:  EKG is  ordered today.  The ekg ordered today demonstrates NSR   Recent Labs: 07/17/2018: Hemoglobin 11.9; Platelets 434 02/19/2019: BUN 28; Creatinine, Ser 1.15; Potassium 4.3; Sodium 135   Lipid Panel No results found for: CHOL, TRIG, HDL, CHOLHDL, VLDL, LDLCALC, LDLDIRECT  Additional studies/ records that were reviewed today include:  2D echo 12/6/2019Study Conclusions   - Left ventricle: The cavity size was normal. Systolic function was    normal. The estimated ejection fraction was in the range of 60%    to 65%. Wall motion was normal; there were no regional wall    motion abnormalities. Left ventricular diastolic function    parameters were normal.  - Atrial septum: No defect or patent foramen ovale was identified.  - Pericardium, extracardiac: A trivial pericardial effusion was    identified.      ASSESSMENT:    1. Essential hypertension   2. Lower extremity edema   3. Obesity (BMI 30-39.9)      PLAN:  In order of problems listed above:  Essential hypertension on carvedilol and increased diltiazem 180 mg daily.  Chlorthalidone stopped because of rising creatinine. Crt improved and irbesartan 75 mg started. BP still high. Patient frustrated it's not coming down. Watching salt, no exercise. Check bmet today and if ok increase irbesartan to 150 mg daily, renal US and refer to Dr. Oval Linsey in HTN clinic  Lower extremity edema wears compression hose  Obesity  diet and exercise recommended    Medication Adjustments/Labs and Tests Ordered: Current medicines are reviewed at length with the patient  today.  Concerns regarding medicines are outlined above.  Medication changes, Labs and Tests ordered today are listed in the Patient Instructions below. Patient Instructions  Medication Instructions:  Your physician recommends that you continue on your current medications as directed. Please refer to the Current Medication list given to you today.  *If you need a refill on your cardiac medications before your next appointment, please call your pharmacy*  Lab Work: TODAY: BMET  If you have labs (blood work) drawn today and your tests are completely normal, you will receive your results only by: Marland Kitchen MyChart Message (if you have MyChart) OR . A paper copy in the mail If you have any lab test that is abnormal or we need to change your treatment, we will call you to review the results.  Testing/Procedures: Your physician has requested that you have a renal artery duplex. During this test, an ultrasound is used to evaluate blood flow to the kidneys. Allow one hour for this exam. Do not eat after midnight the day before and avoid carbonated beverages. Take your medications as you usually do.   Follow-Up: Your physician recommends that you schedule a follow-up appointment with Skeet Latch, MD for HTN Clinic in 1-2 weeks   Other Instructions      Signed, Ermalinda Barrios, PA-C  03/07/2019 2:25 PM    [address], [place], [place]  [id number] Phone: [phone]; Fax: [phone]

## 2019-03-07 ENCOUNTER — Encounter: Payer: Self-pay | Admitting: Physician Assistant

## 2019-03-07 ENCOUNTER — Ambulatory Visit: Payer: BC Managed Care – PPO | Admitting: Physician Assistant

## 2019-03-07 ENCOUNTER — Other Ambulatory Visit: Payer: Self-pay

## 2019-03-07 ENCOUNTER — Telehealth: Payer: Self-pay | Admitting: Nutrition

## 2019-03-07 VITALS — BP 146/90 | HR 84 | Ht 66.0 in | Wt 199.0 lb

## 2019-03-07 DIAGNOSIS — E669 Obesity, unspecified: Secondary | ICD-10-CM | POA: Diagnosis not present

## 2019-03-07 DIAGNOSIS — R600 Localized edema: Secondary | ICD-10-CM

## 2019-03-07 DIAGNOSIS — I10 Essential (primary) hypertension: Secondary | ICD-10-CM | POA: Diagnosis not present

## 2019-03-07 MED ORDER — CARVEDILOL 25 MG PO TABS: 25.0000 mg | ORAL_TABLET | Freq: Two times a day (BID) | ORAL | 3 refills | Status: AC

## 2019-03-07 NOTE — Telephone Encounter (Signed)
Prescription for Control IQ signed on 05/04/18

## 2019-03-07 NOTE — Patient Instructions (Signed)
Medication Instructions:  Your physician recommends that you continue on your current medications as directed. Please refer to the Current Medication list given to you today.  *If you need a refill on your cardiac medications before your next appointment, please call your pharmacy*  Lab Work: TODAY: BMET  If you have labs (blood work) drawn today and your tests are completely normal, you will receive your results only by: Marland Kitchen MyChart Message (if you have MyChart) OR . A paper copy in the mail If you have any lab test that is abnormal or we need to change your treatment, we will call you to review the results.  Testing/Procedures: Your physician has requested that you have a renal artery duplex. During this test, an ultrasound is used to evaluate blood flow to the kidneys. Allow one hour for this exam. Do not eat after midnight the day before and avoid carbonated beverages. Take your medications as you usually do.   Follow-Up: Your physician recommends that you schedule a follow-up appointment with Skeet Latch, MD for HTN Clinic in 1-2 weeks   Other Instructions

## 2019-03-08 ENCOUNTER — Telehealth: Payer: Self-pay | Admitting: *Deleted

## 2019-03-08 LAB — BASIC METABOLIC PANEL
BUN/Creatinine Ratio: 24 — ABNORMAL HIGH (ref 9–23)
BUN: 30 mg/dL — ABNORMAL HIGH (ref 6–20)
CO2: 23 mmol/L (ref 20–29)
Calcium: 9.4 mg/dL (ref 8.7–10.2)
Chloride: 105 mmol/L (ref 96–106)
Creatinine, Ser: 1.25 mg/dL — ABNORMAL HIGH (ref 0.57–1.00)
GFR calc Af Amer: 68 mL/min/{1.73_m2} (ref >59)
GFR calc non Af Amer: 59 mL/min/{1.73_m2} — ABNORMAL LOW (ref >59)
Glucose: 71 mg/dL (ref 65–99)
Potassium: 4.2 mmol/L (ref 3.5–5.2)
Sodium: 139 mmol/L (ref 134–144)

## 2019-03-08 MED ORDER — DILTIAZEM HCL ER COATED BEADS 240 MG PO CP24: 240.0000 mg | ORAL_CAPSULE | Freq: Every day | ORAL | 6 refills | Status: DC

## 2019-03-08 NOTE — Telephone Encounter (Signed)
Pt notified. Will send new prescription to [facility] on [address]

## 2019-03-08 NOTE — Telephone Encounter (Signed)
-----   Message from Imogene Burn, PA-C sent at 03/08/2019  9:53 AM EST ----- Kidney function up a little but stable. Would not increase ibersartan. Increase diltiazem 240 mg daily and keep f/u with Dr. Oval Linsey. thanks

## 2019-03-09 ENCOUNTER — Other Ambulatory Visit: Payer: Self-pay | Admitting: Endocrinology

## 2019-03-09 DIAGNOSIS — E109 Type 1 diabetes mellitus without complications: Secondary | ICD-10-CM

## 2019-03-22 ENCOUNTER — Ambulatory Visit: Payer: BC Managed Care – PPO | Admitting: Cardiovascular Disease

## 2019-03-22 ENCOUNTER — Encounter: Payer: Self-pay | Admitting: Cardiovascular Disease

## 2019-03-22 ENCOUNTER — Other Ambulatory Visit: Payer: Self-pay

## 2019-03-22 ENCOUNTER — Ambulatory Visit (HOSPITAL_COMMUNITY)
Admission: RE | Admit: 2019-03-22 | Discharge: 2019-03-22 | Disposition: A | Discharge status: Home / Self Care | Payer: BC Managed Care – PPO | Source: Ambulatory Visit | Attending: Cardiology | Admitting: Cardiology

## 2019-03-22 VITALS — BP 142/82 | HR 88 | Temp 98.4°F | Ht 66.0 in | Wt 200.0 lb

## 2019-03-22 DIAGNOSIS — Z5181 Encounter for therapeutic drug level monitoring: Secondary | ICD-10-CM

## 2019-03-22 DIAGNOSIS — I10 Essential (primary) hypertension: Secondary | ICD-10-CM

## 2019-03-22 DIAGNOSIS — E669 Obesity, unspecified: Secondary | ICD-10-CM | POA: Insufficient documentation

## 2019-03-22 DIAGNOSIS — R600 Localized edema: Secondary | ICD-10-CM | POA: Diagnosis present

## 2019-03-22 MED ORDER — HYDRALAZINE HCL 50 MG PO TABS: 50.0000 mg | ORAL_TABLET | Freq: Two times a day (BID) | ORAL | 1 refills | Status: DC

## 2019-03-22 NOTE — Progress Notes (Signed)
Hypertension Clinic Initial Assessment:    Date:  03/22/2019   ID:  Barbara Donovan, DOB Jul 26, 1990, MRN [id number]  PCP:  Barbara Bien, MD  Cardiologist:  Barbara Him, MD  Nephrologist:  Referring MD: Barbara Bien, MD   CC: Hypertension  History of Present Illness:    Barbara Donovan is a 29 y.o. female with a hx of hypertension, diabetes,  here to establish care in the hypertension clinic.  She reports that her blood pressure was high before her pregnancy in 2019 but she was never formally diagnosed with hypertension.  Her initial blood pressures when she became pregnant were okay but she then developed elevated blood pressures.  She did not have preeclampsia.  Since delivering her baby her blood pressures been very difficult to control.  She was also diagnosed with diabetes at age 59.  She has an insulin pump and it has been difficult to control.  Her last hemoglobin A1c was 11%.  She struggles with having a healthy diet.  She tries to cook at home but often has to eat out due to her schedule.  She has a 60-year-old child with special needs and works full-time.  Her child's father is involved and helpful, but he works a 12-hour shift.  So he is unable to assist as much with childcare as he would like.  Barbara Donovan saw Barbara Donovan 01/2019 and her BP was poorly controlled.  Chlorthalidone was added to her regimen. They also discussed following a low sodium diet.  Her creatinine increased to 1.4 so chlorthalidone was stopped and diltiazem was increased. She followed up with our pharmacist and irbesartan was added to her regimen.  Her renal function worsened slightly so irbesartan was not increased.  Echo 12/2017 showed LVEF 60-65% and normal diastolic function.  She checks her blood pressure at home and it typically runs around 150/100.  She does not get much formal exercise.  She has an exercise bike but does not use it.  She does get some shortness of breath when going up and down the  steps but has no chest pain.  She reports lower extremity edema that improves with elevation of her legs.  She denies orthopnea or PND.  She has tried using compression stockings but it does not seem to help much.  She tries to limit her sodium intake.  She takes Lasix once every week or two, but it does not seem to help.  She takes her morning medications at around 7 and the evening at 8:30.  She takes her medications consistently.  Cantril's Ladder: current 6, 5 years 8.5   Previous antihypertensives: Enalapril- didn't work HCTZ (hypokalemia, hypomagnesemia) Amlodipine  Labetalol Nifedipine Spironolactone  Past Medical History:  Diagnosis Date  . Asthma 10/30/2013  . Cataract    NS OU  . Chronic hypertension during pregnancy, antepartum 08/19/2017  . Dehydration 01/28/2018  . Depression during pregnancy, antepartum 07/07/2017   6/20: Short trial of zoloft previously, reports didn't help much but also didn't give it a chance Discussed r/b/a SSRIs in pregnancy, agrees to try Zoloft again, rx sent No SI/HI/red flags  . Diabetes ([place])    TYPE I  . 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  Marland Kitchen HTN (hypertension)   . Hypertensive retinopathy    OU  . Hypokalemia 01/22/2018  . Hypomagnesemia 01/28/2018  . Intractable nausea and vomiting 01/22/2018  . Intrauterine growth restriction (IUGR) affecting care  of mother 12/22/2017  . Morbid obesity ([place])   . 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 Severe hyperemesis gravidarum 10/30/2017  . Type I diabetes mellitus ([place]) 07/07/2017   Current Diabetic Medications:  Insulin  [x]  Aspirin 81 mg daily after 12 weeks (? A2/B GDM)  Required Referrals for A1GDM or A2GDM: [x]  Diabetes Education and Testing Supplies [x]  Nutrition Cousult  For A2/B GDM or higher classes of DM [x]  Diabetes  Education and Testing Supplies [x]  Nutrition Counsult [x]  Fetal ECHO after 20 weeks  [x]  Eye exam for retina evaluation - severe retinopathy 7/19  Base  . Ventricular septal defect (VSD) of fetus in singleton pregnancy, antepartum 09/30/2017   May go to newborn nursery per Dr. Lenard Simmer Echo prior to discharge    Past Surgical History:  Procedure Laterality Date  . [place] VITRECTOMY WITH 20 GAUGE MVR PORT FOR MACULAR HOLE Left 07/20/2018   Procedure: 25 GAUGE PARS PLANA VITRECTOMY LEFT EYE;  Surgeon: Bernarda Caffey, MD;  Location: [facility];  Service: Ophthalmology;  Laterality: Left;  . CESAREAN SECTION N/A 12/26/2017   Procedure: CESAREAN SECTION;  Surgeon: Osborne Oman, MD;  Location: [facility];  Service: Obstetrics;  Laterality: N/A;  . EYE EXAMINATION UNDER ANESTHESIA Right 07/20/2018   Procedure: Eye Exam Under Anesthesia RIGHT EYE;  Surgeon: Bernarda Caffey, MD;  Location: [facility];  Service: Ophthalmology;  Laterality: Right;  . EYE SURGERY    . INJECTION OF SILICONE OIL Left 99991111   Procedure: Injection Of Silicone Oil LEFT EYE;  Surgeon: Bernarda Caffey, MD;  Location: [facility];  Service: Ophthalmology;  Laterality: Left;  . LASER PHOTO ABLATION Right 07/20/2018   Procedure: Laser Photo Ablation RIGHT EYE;  Surgeon: Bernarda Caffey, MD;  Location: [facility];  Service: Ophthalmology;  Laterality: Right;  . MEMBRANE PEEL Left 07/20/2018   Procedure: Membrane Peel LEFT EYE;  Surgeon: Bernarda Caffey, MD;  Location: [facility];  Service: Ophthalmology;  Laterality: Left;  . MITOMYCIN C APPLICATION Bilateral 99991111   Procedure: Avastin Application;  Surgeon: Bernarda Caffey, MD;  Location: [facility];  Service: Ophthalmology;  Laterality: Bilateral;  . NO PAST SURGERIES    . PHOTOCOAGULATION WITH LASER Left 07/20/2018   Procedure: Photocoagulation With Laser LEFT EYE;  Surgeon: Bernarda Caffey, MD;  Location: [facility];  Service: Ophthalmology;  Laterality: Left;  . RETINAL DETACHMENT SURGERY Left 07/20/2018    Dr. Bernarda Caffey  . WISDOM TOOTH EXTRACTION      Current Medications: Current Meds  Medication Sig  . Blood Pressure Monitoring (OMRON 3 SERIES BP MONITOR) DEVI 1 each by Does not apply route daily. Take daily blood pressure. Please report back to Barbara Donovan in 2 wks.  . carvedilol (COREG) 25 MG tablet Take 1 tablet (25 mg total) by mouth 2 (two) times daily.  Marland Kitchen diltiazem (CARDIZEM CD) 240 MG 24 hr capsule Take 1 capsule (240 mg total) by mouth daily.  . furosemide (LASIX) 20 MG tablet Take 40 mg by mouth daily as needed.  . insulin aspart (NOVOLOG) 100 UNIT/ML injection For use in pump, total of 60 units per day.  . Insulin Glargine (BASAGLAR KWIKPEN) 100 UNIT/ML SOPN Inject 0.15 mLs (15 Units total) into the skin daily.  . Insulin Pen Needle (PEN NEEDLES) 32G X 5 MM MISC 1 Device by Does not apply route 4 (four) times daily.  . irbesartan (AVAPRO) 75 MG tablet  Take 1 tablet (75 mg total) by mouth daily.  . Multiple Vitamin (MULTIVITAMIN WITH MINERALS) TABS tablet Take 1 tablet by mouth daily.  . OMEGA-3 FATTY ACIDS PO Take 2 capsules by mouth daily.  [name] VERIO test strip USE AS DIRECTED 5 TIMES DAILY  . sertraline (ZOLOFT) 25 MG tablet Take 1 tablet (25 mg total) by mouth daily.  . [DISCONTINUED] furosemide (LASIX) 20 MG tablet TAKE 1 TABLET BY MOUTH EVERY DAY AS NEEDED (Patient taking differently: 40 mg. )     Allergies:   Patient has no known allergies.   Social History   Socioeconomic History  . Marital status: Single    Spouse name: Not on file  . Number of children: Not on file  . Years of education: Not on file  . Highest education level: Not on file  Occupational History  . Not on file  Tobacco Use  . Smoking status: Never Smoker  . Smokeless tobacco: Never Used  Substance and Sexual Activity  . Alcohol use: Not Currently    Comment: SOCIALLY  . Drug use: Never  . Sexual activity: Not Currently    Birth control/protection: None  Other Topics Concern  . Not  on file  Social History Narrative  . Not on file   Social Determinants of Health   Financial Resource Strain:   . Difficulty of Paying Living Expenses: Not on file  Food Insecurity:   . Worried About Charity fundraiser in the Last Year: Not on file  . Ran Out of Food in the Last Year: Not on file  Transportation Needs:   . Lack of Transportation (Medical): Not on file  . Lack of Transportation (Non-Medical): Not on file  Physical Activity:   . Days of Exercise per Week: Not on file  . Minutes of Exercise per Session: Not on file  Stress:   . Feeling of Stress : Not on file  Social Connections:   . Frequency of Communication with Friends and Family: Not on file  . Frequency of Social Gatherings with Friends and Family: Not on file  . Attends Religious Services: Not on file  . Active Member of Clubs or Organizations: Not on file  . Attends Archivist Meetings: Not on file  . Marital Status: Not on file     Family History: The patient's family history includes Aneurysm (age of onset: 22) in her mother; COPD in her father; Cataracts in her father; Diabetes in her father and mother; Healthy in her daughter and sister; Heart attack in her father; Heart disease in her father; Seizures in her mother; Stroke in her maternal grandfather. There is no history of Amblyopia, Blindness, Glaucoma, Macular degeneration, Retinal detachment, Strabismus, or Retinitis pigmentosa.  ROS:   Please see the history of present illness.     All other systems reviewed and are negative.  EKGs/Labs/Other Studies Reviewed:    EKG:  EKG is no ordered today.  The ekg ordered 03/07/2019 demonstrates sinus rhythm.  Rate 84 bpm.  Recent Labs: 07/17/2018: Hemoglobin 11.9; Platelets 434 03/07/2019: BUN 30; Creatinine, Ser 1.25; Potassium 4.2; Sodium 139   Recent Lipid Panel No results found for: CHOL, TRIG, HDL, CHOLHDL, VLDL, LDLCALC, LDLDIRECT  Physical Exam:    VS:  BP (!) 142/82   Pulse 88    Temp 98.4 F (36.9 C)   Ht 5\' 6"  (1.676 m)   Wt 200 lb (90.7 kg)   SpO2 96%   BMI 32.28 kg/m  Wt Readings from Last 3 Encounters:  03/22/19 200 lb (90.7 kg)  03/07/19 199 lb (90.3 kg)  02/21/19 194 lb 9.6 oz (88.3 kg)    GENERAL:  Well appearing HEENT: Pupils equal round and reactive, fundi not visualized, oral mucosa unremarkable NECK:  No jugular venous distention, waveform within normal limits, carotid upstroke brisk and symmetric, no bruits LUNGS:  Clear to auscultation bilaterally HEART:  RRR.  PMI not displaced or sustained,S1 and S2 within normal limits, no S3, no S4, no clicks, no rubs, no murmurs ABD:  Flat, positive bowel sounds normal in frequency in pitch, no bruits, no rebound, no guarding, no midline pulsatile mass, no hepatomegaly, no splenomegaly EXT:  2 plus pulses throughout, no edema, no cyanosis no clubbing SKIN:  No rashes no nodules NEURO:  Cranial nerves II through XII grossly intact, motor grossly intact throughout PSYCH:  Cognitively intact, oriented to person place and time   ASSESSMENT:    1. Essential hypertension   2. Therapeutic drug monitoring   3. Chronic hypertension     PLAN:    Ms. Muna blood pressure remains poorly controlled.  She was try to work on her diet but struggles due to life stressors.  She is going to continue trying to limit sodium intake where possible.  She is interested in enrolling in our PREP program through the [facility] and increasing her exercise.  We do not want to increase her irbesartan due to her chronic kidney disease.  Continue carvedilol, diltiazem, irbesartan, and Lasix.  It is okay for her to take 40 mg of Lasix as needed to see if this helps with her swelling.  However I am suspicious this may be due to venous insufficiency.  She will check a BMP in a couple weeks.  We will start hydralazine 50 mg twice daily.  She will track her blood pressures at home and bring them to follow-up with our pharmacist.  She had renal  artery Dopplers checked today and the results are pending.   Disposition:    FU with MD/PharmD in 1 month     Medication Adjustments/Labs and Tests Ordered: Current medicines are reviewed at length with the patient today.  Concerns regarding medicines are outlined above.  Orders Placed This Encounter  Procedures  . Basic metabolic panel   Meds ordered this encounter  Medications  . hydrALAZINE (APRESOLINE) 50 MG tablet    Sig: Take 1 tablet (50 mg total) by mouth in the morning and at bedtime.    Dispense:  180 tablet    Refill:  1     Signed, Skeet Latch, MD  03/22/2019 11:08 AM    [place]

## 2019-03-22 NOTE — Patient Instructions (Addendum)
Medication Instructions:  START HYDRALAZINE 50 MG TWICE A DAY   INCREASE YOUR FUROSEMIDE TO 40 MG AS NEEDED    Labwork: BMET IN 2 WEEKS    Testing/Procedures: NONE   Follow-Up: Your physician recommends that you schedule a follow-up appointment in: PHARM D 04/19/2019 2:00 PM   Your physician recommends that you schedule a follow-up appointment in: DR Holcomb IN 4 MONTHS, WILL CALL YOU WITH APPOINTMENT    You will receive a phone call from the PREP exercise and nutrition program to schedule an initial assessment.  Special Instructions:   MONITOR YOUR BLOOD PRESSURE TWICE A DAY, LOG IN THE BOOK PROVIDED. BRING TO YOUR 1 MONTH FOLLOW UP   CALL YOUR ENDOCRINOLOGY OFFICE TO SEE IF YOU CAN CHANGE TO Philemon Kingdom, MD, PhD   DASH Eating Plan DASH stands for "Dietary Approaches to Stop Hypertension." The DASH eating plan is a healthy eating plan that has been shown to reduce high blood pressure (hypertension). It may also reduce your risk for type 2 diabetes, heart disease, and stroke. The DASH eating plan may also help with weight loss. What are tips for following this plan?  General guidelines  Avoid eating more than 2,300 mg (milligrams) of salt (sodium) a day. If you have hypertension, you may need to reduce your sodium intake to 1,500 mg a day.  Limit alcohol intake to no more than 1 drink a day for nonpregnant women and 2 drinks a day for men. One drink equals 12 oz of beer, 5 oz of wine, or 1 oz of hard liquor.  Work with your health care provider to maintain a healthy body weight or to lose weight. Ask what an ideal weight is for you.  Get at least 30 minutes of exercise that causes your heart to beat faster (aerobic exercise) most days of the week. Activities may include walking, swimming, or biking.  Work with your health care provider or diet and nutrition specialist (dietitian) to adjust your eating plan to your individual calorie needs. Reading food labels   Check  food labels for the amount of sodium per serving. Choose foods with less than 5 percent of the Daily Value of sodium. Generally, foods with less than 300 mg of sodium per serving fit into this eating plan.  To find whole grains, look for the word "whole" as the first word in the ingredient list. Shopping  Buy products labeled as "low-sodium" or "no salt added."  Buy fresh foods. Avoid canned foods and premade or frozen meals. Cooking  Avoid adding salt when cooking. Use salt-free seasonings or herbs instead of table salt or sea salt. Check with your health care provider or pharmacist before using salt substitutes.  Do not fry foods. Cook foods using healthy methods such as baking, boiling, grilling, and broiling instead.  Cook with heart-healthy oils, such as olive, canola, soybean, or sunflower oil. Meal planning  Eat a balanced diet that includes: ? 5 or more servings of fruits and vegetables each day. At each meal, try to fill half of your plate with fruits and vegetables. ? Up to 6-8 servings of whole grains each day. ? Less than 6 oz of lean meat, poultry, or fish each day. A 3-oz serving of meat is about the same size as a deck of cards. One egg equals 1 oz. ? 2 servings of low-fat dairy each day. ? A serving of nuts, seeds, or beans 5 times each week. ? Heart-healthy fats. Healthy fats called Omega-3 fatty  acids are found in foods such as flaxseeds and coldwater fish, like sardines, salmon, and mackerel.  Limit how much you eat of the following: ? Canned or prepackaged foods. ? Food that is high in trans fat, such as fried foods. ? Food that is high in saturated fat, such as fatty meat. ? Sweets, desserts, sugary drinks, and other foods with added sugar. ? Full-fat dairy products.  Do not salt foods before eating.  Try to eat at least 2 vegetarian meals each week.  Eat more home-cooked food and less restaurant, buffet, and fast food.  When eating at a restaurant, ask that  your food be prepared with less salt or no salt, if possible. What foods are recommended? The items listed may not be a complete list. Talk with your dietitian about what dietary choices are best for you. Grains Whole-grain or whole-wheat bread. Whole-grain or whole-wheat pasta. Brown rice. [place]. Bulgur. Whole-grain and low-sodium cereals. Pita bread. Low-fat, low-sodium crackers. Whole-wheat flour tortillas. Vegetables Fresh or frozen vegetables (raw, steamed, roasted, or grilled). Low-sodium or reduced-sodium tomato and vegetable juice. Low-sodium or reduced-sodium tomato sauce and tomato paste. Low-sodium or reduced-sodium canned vegetables. Fruits All fresh, dried, or frozen fruit. Canned fruit in natural juice (without added sugar). Meat and other protein foods Skinless chicken or [place]. Ground chicken or [place]. Pork with fat trimmed off. Fish and seafood. Egg whites. Dried beans, peas, or lentils. Unsalted nuts, nut butters, and seeds. Unsalted canned beans. Lean cuts of beef with fat trimmed off. Low-sodium, lean deli meat. Dairy Low-fat (1%) or fat-free (skim) milk. Fat-free, low-fat, or reduced-fat cheeses. Nonfat, low-sodium ricotta or cottage cheese. Low-fat or nonfat yogurt. Low-fat, low-sodium cheese. Fats and oils Soft margarine without trans fats. Vegetable oil. Low-fat, reduced-fat, or light mayonnaise and salad dressings (reduced-sodium). Canola, safflower, olive, soybean, and sunflower oils. Avocado. Seasoning and other foods Herbs. Spices. Seasoning mixes without salt. Unsalted popcorn and pretzels. Fat-free sweets. What foods are not recommended? The items listed may not be a complete list. Talk with your dietitian about what dietary choices are best for you. Grains Baked goods made with fat, such as croissants, muffins, or some breads. Dry pasta or rice meal packs. Vegetables Creamed or fried vegetables. Vegetables in a cheese sauce. Regular canned vegetables  (not low-sodium or reduced-sodium). Regular canned tomato sauce and paste (not low-sodium or reduced-sodium). Regular tomato and vegetable juice (not low-sodium or reduced-sodium). Angie Fava. Olives. Fruits Canned fruit in a light or heavy syrup. Fried fruit. Fruit in cream or butter sauce. Meat and other protein foods Fatty cuts of meat. Ribs. Fried meat. Berniece Salines. Sausage. Bologna and other processed lunch meats. Salami. Fatback. Hotdogs. Bratwurst. Salted nuts and seeds. Canned beans with added salt. Canned or smoked fish. Whole eggs or egg yolks. Chicken or [place] with skin. Dairy Whole or 2% milk, cream, and half-and-half. Whole or full-fat cream cheese. Whole-fat or sweetened yogurt. Full-fat cheese. Nondairy creamers. Whipped toppings. Processed cheese and cheese spreads. Fats and oils Butter. Stick margarine. Lard. Shortening. Ghee. Bacon fat. Tropical oils, such as coconut, palm kernel, or palm oil. Seasoning and other foods Salted popcorn and pretzels. Onion salt, garlic salt, seasoned salt, table salt, and sea salt. Worcestershire sauce. Tartar sauce. Barbecue sauce. Teriyaki sauce. Soy sauce, including reduced-sodium. Steak sauce. Canned and packaged gravies. Fish sauce. Oyster sauce. Cocktail sauce. Horseradish that you find on the shelf. Ketchup. Mustard. Meat flavorings and tenderizers. Bouillon cubes. Hot sauce and Tabasco sauce. Premade or packaged marinades. Premade or packaged  taco seasonings. Relishes. Regular salad dressings. Where to find more information:  National Heart, Lung, and [place]: https://Klann-eaton.com/  American Heart Association: www.heart.org Summary  The DASH eating plan is a healthy eating plan that has been shown to reduce high blood pressure (hypertension). It may also reduce your risk for type 2 diabetes, heart disease, and stroke.  With the DASH eating plan, you should limit salt (sodium) intake to 2,300 mg a day. If you have hypertension, you may need to  reduce your sodium intake to 1,500 mg a day.  When on the DASH eating plan, aim to eat more fresh fruits and vegetables, whole grains, lean proteins, low-fat dairy, and heart-healthy fats.  Work with your health care provider or diet and nutrition specialist (dietitian) to adjust your eating plan to your individual calorie needs. This information is not intended to replace advice given to you by your health care provider. Make sure you discuss any questions you have with your health care provider. Document Released: 12/24/2010 Document Revised: 12/17/2016 Document Reviewed: 12/29/2015 Elsevier Patient Education  2020 [employer].

## 2019-03-26 ENCOUNTER — Telehealth: Payer: Self-pay

## 2019-03-26 ENCOUNTER — Ambulatory Visit: Payer: BC Managed Care – PPO

## 2019-03-26 NOTE — Telephone Encounter (Signed)
LVMT request call back reference PREP

## 2019-03-26 NOTE — Telephone Encounter (Signed)
Connected with patient reference PREP. Is interested in participating.  Needs an evening class--after 5pm--Will recall patient when we have an option of evening class.

## 2019-04-06 LAB — BASIC METABOLIC PANEL
BUN/Creatinine Ratio: 21 (ref 9–23)
BUN: 31 mg/dL — ABNORMAL HIGH (ref 6–20)
CO2: 20 mmol/L (ref 20–29)
Calcium: 8.6 mg/dL — ABNORMAL LOW (ref 8.7–10.2)
Chloride: 106 mmol/L (ref 96–106)
Creatinine, Ser: 1.47 mg/dL — ABNORMAL HIGH (ref 0.57–1.00)
GFR calc Af Amer: 56 mL/min/{1.73_m2} — ABNORMAL LOW (ref >59)
GFR calc non Af Amer: 48 mL/min/{1.73_m2} — ABNORMAL LOW (ref >59)
Glucose: 124 mg/dL — ABNORMAL HIGH (ref 65–99)
Potassium: 5.3 mmol/L — ABNORMAL HIGH (ref 3.5–5.2)
Sodium: 139 mmol/L (ref 134–144)

## 2019-04-10 ENCOUNTER — Ambulatory Visit: Payer: BC Managed Care – PPO

## 2019-04-16 ENCOUNTER — Telehealth: Payer: Self-pay | Admitting: Nutrition

## 2019-04-16 NOTE — Telephone Encounter (Signed)
Message left X2 to reschedule this Wednesday appointment for earlier that morning, or Tuesday.

## 2019-04-17 NOTE — Telephone Encounter (Signed)
Message left on machine that I am unable to keep the appointment for tomorrow and would like her to call to reschedule, or just confirm that she got this message.  Telephone number given

## 2019-04-18 ENCOUNTER — Ambulatory Visit: Payer: BC Managed Care – PPO | Admitting: Nutrition

## 2019-04-18 ENCOUNTER — Telehealth: Payer: Self-pay | Admitting: Nutrition

## 2019-04-18 NOTE — Telephone Encounter (Signed)
Returning message to [place] from night nurse.  Message left on machine to call me to reschedule, or to call front desk to reschedule with me. Telephone numbers given for both options

## 2019-04-19 ENCOUNTER — Ambulatory Visit (INDEPENDENT_AMBULATORY_CARE_PROVIDER_SITE_OTHER): Payer: BC Managed Care – PPO | Admitting: Pharmacist

## 2019-04-19 ENCOUNTER — Other Ambulatory Visit: Payer: Self-pay

## 2019-04-19 VITALS — BP 144/68 | HR 73 | Resp 14 | Ht 66.0 in | Wt 201.4 lb

## 2019-04-19 DIAGNOSIS — I10 Essential (primary) hypertension: Secondary | ICD-10-CM | POA: Diagnosis not present

## 2019-04-19 MED ORDER — HYDRALAZINE HCL 50 MG PO TABS: 50.0000 mg | ORAL_TABLET | Freq: Three times a day (TID) | ORAL | 1 refills | Status: DC

## 2019-04-19 MED ORDER — FUROSEMIDE 20 MG PO TABS: 40.0000 mg | ORAL_TABLET | Freq: Every day | ORAL | 1 refills | Status: DC | PRN

## 2019-04-19 NOTE — Patient Instructions (Addendum)
Return for a  follow up appointment in 4 weeks  Go to the lab in 2 weeks   Check your blood pressure at home daily (if able) and keep record of the readings.  Take your BP meds as follows: * INCREASE hydralazine to 50mg  three times daily* * TAKE furosemide 40mg  every 3 days ONLY IF NEEDED*  Bring all of your meds, your BP cuff and your record of home blood pressures to your next appointment.  Exercise as you're able, try to walk approximately 30 minutes per day.  Keep salt intake to a minimum, especially watch canned and prepared boxed foods.  Eat more fresh fruits and vegetables and fewer canned items.  Avoid eating in fast food restaurants.    HOW TO TAKE YOUR BLOOD PRESSURE: . Rest 5 minutes before taking your blood pressure. .  Don't smoke or drink caffeinated beverages for at least 30 minutes before. . Take your blood pressure before (not after) you eat. . Sit comfortably with your back supported and both feet on the floor (don't cross your legs). . Elevate your arm to heart level on a table or a desk. . Use the proper sized cuff. It should fit smoothly and snugly around your bare upper arm. There should be enough room to slip a fingertip under the cuff. The bottom edge of the cuff should be 1 inch above the crease of the elbow. . Ideally, take 3 measurements at one sitting and record the average.

## 2019-04-19 NOTE — Progress Notes (Signed)
Patient ID: Barbara Donovan                 DOB: [birth year]                      MRN: [id number]     HPI: Barbara Donovan is a 29 y.o. female referred by Dr. Oval Linsey to HTN clinic. PMH includes asthma, post-partum depression, DM-I, and uncontrolled hypertension. Patient refused to increase irbesartan 75mg  to 150mg  during OV with Dr Oval Linsey. Hydralazine was added to therapy at 50mg  twice daily by Dr. Oval Linsey on 03/22/2019. She got teary during this OV , she was very worried about her kidneys and her poorly controlled BP.  Current HTN meds:  Carvedilol 25mg  twice daily (6:30am & 8:30pm) diltiazem 240mg  daily (6:30am) Furosemide 40mg  daily as needed - stop  Hydralazine 50mg  twice daily(6:30am& 8:30pm) Irbesartan 75mg  daily(6:30am)  Previously tried:  Enalapril - lack response HCTZ - hypokalemia, hypomagnesemia chlorthalidone - increase SCr Amlodipine Labetalol Nifedipine Spironolactone  BP goal: <130/80  Family History: The patient's family history includes Aneurysm (age of onset: 39) in her mother; COPD in her father; Cataracts in her father; Diabetes in her father and mother; Healthy in her daughter and sister; Heart attack in her father; Heart disease in her father; Seizures in her mother; Stroke in her maternal grandfather. There is no history of Amblyopia, Blindness, Glaucoma, Macular degeneration, Retinal detachment, Strabismus, or Retinitis pigmentosa.  Social History: denies tobacco use, report social alcohol use  Diet: working on low sodium diet. Lunch is challenging if at work but she is trying to improve food choices.   Exercise: activities of daily living, plan to start walking more during the weekends  Home BP readings:  8 morning readings; average 140/88; HR range 84-96bpm 5 evening readings; average 139/89; HR 89-100bpm  Wt Readings from Last 3 Encounters:  04/19/19 201 lb 6.4 oz (91.4 kg)  03/22/19 200 lb (90.7 kg)  03/07/19 199 lb (90.3 kg)   BP Readings from  Last 3 Encounters:  04/19/19 (!) 144/68  03/22/19 (!) 142/82  03/07/19 (!) 146/90   Pulse Readings from Last 3 Encounters:  04/19/19 73  03/22/19 88  03/07/19 84    Renal function: CrCl cannot be calculated (Patient's most recent lab result is older than the maximum 21 days allowed.).  Past Medical History:  Diagnosis Date  . Asthma 10/30/2013  . Cataract    NS OU  . Chronic hypertension during pregnancy, antepartum 08/19/2017  . Dehydration 01/28/2018  . Depression during pregnancy, antepartum 07/07/2017   6/20: Short trial of zoloft previously, reports didn't help much but also didn't give it a chance Discussed r/b/a SSRIs in pregnancy, agrees to try Zoloft again, rx sent No SI/HI/red flags  . Diabetes (Barbara Donovan)    TYPE I  . Diabetic retinopathy ([place]) 06/09/2017   07/2017 with bilateral severe diabetic non-proliferative retinopathy with macular edema.  Marland Kitchen HTN (hypertension)   . Hypertensive retinopathy    OU  . Hypokalemia 01/22/2018  . Hypomagnesemia 01/28/2018  . Intractable nausea and vomiting 01/22/2018  . Intrauterine growth restriction (IUGR) affecting care of mother 12/22/2017  . Morbid obesity (Barbara Donovan)   . 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 Severe hyperemesis gravidarum 10/30/2017  . Type I diabetes mellitus (Barbara Donovan) 07/07/2017   Current Diabetic Medications:  Insulin  [x]  Aspirin 81 mg daily after 12 weeks (? A2/B  GDM)  Required Referrals for A1GDM or A2GDM: [x]  Diabetes Education and Testing Supplies [x]  Nutrition Cousult  For A2/B GDM or higher classes of DM [x]  Diabetes Education and Testing Supplies [x]  Nutrition Counsult [x]  Fetal ECHO after 20 weeks  [x]  Eye exam for retina evaluation - severe retinopathy 7/19  Base  . Ventricular septal defect (VSD) of fetus in singleton pregnancy, antepartum 09/30/2017   May go to newborn nursery per Dr. Lenard Simmer Echo prior to discharge    Current  Outpatient Medications on File Prior to Visit  Medication Sig Dispense Refill  . Blood Pressure Monitoring (OMRON 3 SERIES BP MONITOR) DEVI 1 each by Does not apply route daily. Take daily blood pressure. Please report back to Dr. Radford Pax in 2 wks. 1 Device 0  . carvedilol (COREG) 25 MG tablet Take 1 tablet (25 mg total) by mouth 2 (two) times daily. 180 tablet 3  . diltiazem (CARDIZEM CD) 240 MG 24 hr capsule Take 1 capsule (240 mg total) by mouth daily. 30 capsule 6  . Insulin Pen Needle (PEN NEEDLES) 32G X 5 MM MISC 1 Device by Does not apply route 4 (four) times daily. 120 each 11  . irbesartan (AVAPRO) 75 MG tablet Take 1 tablet (75 mg total) by mouth daily. 30 tablet 11  . Multiple Vitamin (MULTIVITAMIN WITH MINERALS) TABS tablet Take 1 tablet by mouth daily.    . OMEGA-3 FATTY ACIDS PO Take 2 capsules by mouth daily.    Barbara Donovan VERIO test strip USE AS DIRECTED 5 TIMES DAILY 200 strip 2  . sertraline (ZOLOFT) 25 MG tablet Take 1 tablet (25 mg total) by mouth daily. 90 tablet 3   No current facility-administered medications on file prior to visit.    No Known Allergies  Blood pressure (!) 144/68, pulse 73, resp. rate 14, height 5\' 6"  (1.676 m), weight 201 lb 6.4 oz (91.4 kg), SpO2 99 %, unknown if currently breastfeeding.  Chronic hypertension Blood pressure remains above goal and furosemide was HELD by Dr Oval Linsey due to increase in Scr. [name] presents and patient is very worried about managing her health.   We spent a good potion of the visit talking about positive lifestyle modifications. Recommend to start at small increments like walking 10 minutes per day 3-4 times a day and decreasing sodium in her diet by avopiding the "salty six". Encouraged to elevated her legs while sitting at home and to take furosemide every 3 days.  Will increase hydralazine to 50mg  TID, increase hydration by adding 1 more glass of water per day, repeat BMET in 2 weeks, and follow up with HTN clinic  in 4 weeks.    Aurorah Schlachter Rodriguez-Guzman PharmD, BCPS, [facility] [place] [address] 04/26/2019 2:40 PM

## 2019-04-20 ENCOUNTER — Encounter (INDEPENDENT_AMBULATORY_CARE_PROVIDER_SITE_OTHER): Payer: Self-pay | Admitting: Ophthalmology

## 2019-04-20 ENCOUNTER — Other Ambulatory Visit (INDEPENDENT_AMBULATORY_CARE_PROVIDER_SITE_OTHER): Payer: Self-pay

## 2019-04-20 ENCOUNTER — Ambulatory Visit (INDEPENDENT_AMBULATORY_CARE_PROVIDER_SITE_OTHER): Payer: BC Managed Care – PPO | Admitting: Ophthalmology

## 2019-04-20 DIAGNOSIS — I10 Essential (primary) hypertension: Secondary | ICD-10-CM

## 2019-04-20 DIAGNOSIS — E103513 Type 1 diabetes mellitus with proliferative diabetic retinopathy with macular edema, bilateral: Secondary | ICD-10-CM | POA: Diagnosis not present

## 2019-04-20 DIAGNOSIS — H35033 Hypertensive retinopathy, bilateral: Secondary | ICD-10-CM

## 2019-04-20 DIAGNOSIS — H25811 Combined forms of age-related cataract, right eye: Secondary | ICD-10-CM

## 2019-04-20 DIAGNOSIS — H3581 Retinal edema: Secondary | ICD-10-CM | POA: Diagnosis not present

## 2019-04-20 DIAGNOSIS — H4313 Vitreous hemorrhage, bilateral: Secondary | ICD-10-CM | POA: Diagnosis not present

## 2019-04-20 DIAGNOSIS — H3342 Traction detachment of retina, left eye: Secondary | ICD-10-CM | POA: Diagnosis not present

## 2019-04-20 DIAGNOSIS — Z961 Presence of intraocular lens: Secondary | ICD-10-CM

## 2019-04-20 MED ORDER — BRIMONIDINE TARTRATE 0.15 % OP SOLN: 1.0000 [drp] | Freq: Three times a day (TID) | OPHTHALMIC | 11 refills | Status: DC

## 2019-04-20 MED ORDER — DORZOLAMIDE HCL-TIMOLOL MAL 2-0.5 % OP SOLN: 1.0000 [drp] | Freq: Two times a day (BID) | OPHTHALMIC | 11 refills | Status: DC

## 2019-04-20 MED ORDER — BEVACIZUMAB CHEMO INJECTION 1.25MG/0.05ML SYRINGE FOR KALEIDOSCOPE
1.2500 mg | INTRAVITREAL | Status: AC | PRN
  Administered 2019-04-20: 16:00:00 1.25 mg via INTRAVITREAL

## 2019-04-20 NOTE — Progress Notes (Signed)
[facility] Note  04/20/2019     CHIEF COMPLAINT Patient presents for Retina Follow Up   HISTORY OF PRESENT ILLNESS: Barbara Donovan is a 29 y.o. female who presents to the clinic today for:   HPI    Retina Follow Up    Patient presents with  Diabetic Retinopathy.  In both eyes.  This started 16 weeks ago.  Severity is moderate.  I, the attending physician,  performed the HPI with the patient and updated documentation appropriately.          Comments    Patient here for retina follow up for PDR OU (OS>OD). Patient states vision about the same. Still has oil in OS. No eye pain. Saw cardiologist yesterday.        Last edited by Bernarda Caffey, MD on 04/20/2019  3:53 PM. 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  Patient states vision is doing well.  Patient states Dr. Eulas Post reports that her "lens looks good"   Referring physician: Fanny Bien, MD [address] [place],  [place] [address]  HISTORICAL INFORMATION:   Selected notes from the MEDICAL RECORD NUMBER Referred by Dr. [name] for concern of bilateral CRVO LEE-  Ocular Hx-  PMH-     CURRENT MEDICATIONS: Current Outpatient Medications (Ophthalmic Drugs)  Medication Sig  . brimonidine (ALPHAGAN) 0.15 % ophthalmic solution Place 1 drop into the left eye every 8 (eight) hours.  . dorzolamide-timolol (COSOPT) 22.3-6.8 MG/ML ophthalmic solution Place 1 drop into the left eye 2 (two) times daily.   No current facility-administered medications for this visit. (Ophthalmic Drugs)   Current Outpatient Medications (Other)  Medication Sig  . Blood Pressure Monitoring (OMRON 3 SERIES BP MONITOR) DEVI 1 each by Does not apply route daily. Take daily blood pressure. Please report back to Dr. Radford Pax in 2 wks.  . carvedilol (COREG) 25 MG tablet Take 1 tablet (25 mg total) by mouth 2 (two) times daily.  Marland Kitchen diltiazem (CARDIZEM CD) 240 MG 24 hr  capsule Take 1 capsule (240 mg total) by mouth daily.  . furosemide (LASIX) 20 MG tablet Take 2 tablets (40 mg total) by mouth daily as needed for edema.  . hydrALAZINE (APRESOLINE) 50 MG tablet Take 1 tablet (50 mg total) by mouth 3 (three) times daily.  . insulin aspart (NOVOLOG) 100 UNIT/ML injection For use in pump, total of 60 units per day.  . Insulin Pen Needle (PEN NEEDLES) 32G X 5 MM MISC 1 Device by Does not apply route 4 (four) times daily.  . irbesartan (AVAPRO) 75 MG tablet Take 1 tablet (75 mg total) by mouth daily.  . Multiple Vitamin (MULTIVITAMIN WITH MINERALS) TABS tablet Take 1 tablet by mouth daily.  . OMEGA-3 FATTY ACIDS PO Take 2 capsules by mouth daily.  [name] VERIO test strip USE AS DIRECTED 5 TIMES DAILY  . sertraline (ZOLOFT) 25 MG tablet Take 1 tablet (25 mg total) by mouth daily.   No current facility-administered medications for this visit. (Other)      REVIEW OF SYSTEMS: ROS    Positive for: Genitourinary, Endocrine, Eyes, Respiratory   Negative for: Constitutional, Gastrointestinal, Neurological, Skin, Musculoskeletal, HENT, Cardiovascular, Psychiatric, Allergic/Imm, Heme/Lymph   Last edited by Theodore Demark, COA on 04/20/2019  2:16 PM. (History)       ALLERGIES No Known Allergies  PAST MEDICAL HISTORY Past Medical History:  Diagnosis  Date  . Asthma 10/30/2013  . Cataract    NS OU  . Chronic hypertension during pregnancy, antepartum 08/19/2017  . Dehydration 01/28/2018  . Depression during pregnancy, antepartum 07/07/2017   6/20: Short trial of zoloft previously, reports didn't help much but also didn't give it a chance Discussed r/b/a SSRIs in pregnancy, agrees to try Zoloft again, rx sent No SI/HI/red flags  . Diabetes ([place])    TYPE I  . Diabetic retinopathy ([place]) 06/09/2017   07/2017 with bilateral severe diabetic non-proliferative retinopathy with macular edema.  Marland Kitchen HTN (hypertension)   . Hypertensive retinopathy    OU  . Hypokalemia  01/22/2018  . Hypomagnesemia 01/28/2018  . Intractable nausea and vomiting 01/22/2018  . Intrauterine growth restriction (IUGR) affecting care of mother 12/22/2017  . Morbid obesity ([place])   . Nephropathy, diabetic ([place]) 12/29/2017  . Proteinuria due to type 1 diabetes mellitus ([place]) 11/02/2017   Baseline Pr: Cr 10.23  . Severe hyperemesis gravidarum 10/30/2017  . Type I diabetes mellitus ([place]) 07/07/2017   Current Diabetic Medications:  Insulin  [x]  Aspirin 81 mg daily after 12 weeks (? A2/B GDM)  Required Referrals for A1GDM or A2GDM: [x]  Diabetes Education and Testing Supplies [x]  Nutrition Cousult  For A2/B GDM or higher classes of DM [x]  Diabetes Education and Testing Supplies [x]  Nutrition Counsult [x]  Fetal ECHO after 20 weeks  [x]  Eye exam for retina evaluation - severe retinopathy 7/19  Base  . Ventricular septal defect (VSD) of fetus in singleton pregnancy, antepartum 09/30/2017   May go to newborn nursery per Dr. Lenard Simmer Echo prior to discharge   Past Surgical History:  Procedure Laterality Date  . [place] VITRECTOMY WITH 20 GAUGE MVR PORT FOR MACULAR HOLE Left 07/20/2018   Procedure: 25 GAUGE PARS PLANA VITRECTOMY LEFT EYE;  Surgeon: Bernarda Caffey, MD;  Location: [facility];  Service: Ophthalmology;  Laterality: Left;  . CESAREAN SECTION N/A 12/26/2017   Procedure: CESAREAN SECTION;  Surgeon: Osborne Oman, MD;  Location: [facility];  Service: Obstetrics;  Laterality: N/A;  . EYE EXAMINATION UNDER ANESTHESIA Right 07/20/2018   Procedure: Eye Exam Under Anesthesia RIGHT EYE;  Surgeon: Bernarda Caffey, MD;  Location: [facility];  Service: Ophthalmology;  Laterality: Right;  . EYE SURGERY    . INJECTION OF SILICONE OIL Left 01/23/1094   Procedure: Injection Of Silicone Oil LEFT EYE;  Surgeon: Bernarda Caffey, MD;  Location: [facility];  Service: Ophthalmology;  Laterality: Left;  . LASER PHOTO ABLATION Right 07/20/2018   Procedure: Laser Photo Ablation RIGHT EYE;  Surgeon: Bernarda Caffey, MD;   Location: [facility];  Service: Ophthalmology;  Laterality: Right;  . MEMBRANE PEEL Left 07/20/2018   Procedure: Membrane Peel LEFT EYE;  Surgeon: Bernarda Caffey, MD;  Location: [facility];  Service: Ophthalmology;  Laterality: Left;  . MITOMYCIN C APPLICATION Bilateral 0/04/5407   Procedure: Avastin Application;  Surgeon: Bernarda Caffey, MD;  Location: [facility];  Service: Ophthalmology;  Laterality: Bilateral;  . NO PAST SURGERIES    . PHOTOCOAGULATION WITH LASER Left 07/20/2018   Procedure: Photocoagulation With Laser LEFT EYE;  Surgeon: Bernarda Caffey, MD;  Location: [facility];  Service: Ophthalmology;  Laterality: Left;  . RETINAL DETACHMENT SURGERY Left 07/20/2018   Dr. Bernarda Caffey  . WISDOM TOOTH EXTRACTION      FAMILY HISTORY Family History  Problem Relation Age of Onset  . Diabetes Mother   . Aneurysm Mother 86  . Seizures Mother   . Diabetes Father   .  Cataracts Father   . COPD Father   . Heart attack Father   . Heart disease Father   . Healthy Sister   . Healthy Daughter   . Stroke Maternal Grandfather   . Amblyopia Neg Hx   . Blindness Neg Hx   . Glaucoma Neg Hx   . Macular degeneration Neg Hx   . Retinal detachment Neg Hx   . Strabismus Neg Hx   . Retinitis pigmentosa Neg Hx     SOCIAL HISTORY Social History   Tobacco Use  . Smoking status: Never Smoker  . Smokeless tobacco: Never Used  Substance Use Topics  . Alcohol use: Not Currently    Comment: SOCIALLY  . Drug use: Never         OPHTHALMIC EXAM:  Base Eye Exam    Visual Acuity (Snellen - Linear)      Right Left   Dist cc 20/25 20/800   Dist ph cc 20/20 -1 20/150   Correction: Glasses       Tonometry (Tonopen, 2:12 PM)      Right Left   Pressure 15 19       Pupils      Dark Light Shape React APD   Right 4 3 Round Slow None   Left 8 8 Round NONE None       Visual Fields (Counting fingers)      Left Right     Full   Restrictions Total superior temporal, superior nasal deficiencies        Extraocular  Movement      Right Left    Full, Ortho Full, Ortho       Neuro/Psych    Oriented x3: Yes   Mood/Affect: Normal       Dilation    Both eyes: 1.0% Mydriacyl, 2.5% Phenylephrine @ 2:12 PM        Slit Lamp and Fundus Exam    Slit Lamp Exam      Right Left   Lids/Lashes Normal Normal   Conjunctiva/Sclera White and quiet white,quiet; sutures dissolved   Cornea trace Punctate epithelial erosions Well healed cataract wounds   Anterior Chamber Deep and quiet Deep and quiet   Iris Round, moderate dilation, No NVI Round and dilated to 5.78mm, No NVI   Lens trace Nuclear sclerosis and cortical changes PCIOL in good position; 2-3+ PCO   Vitreous VH cleared centrally.  Blood stained vitreous condensations and clots settled inferiorly good oil fill       Fundus Exam      Right Left   Disc Pink and Sharp, trace fibrosis at 0130 Pink and sharp   C/D Ratio 0.3 0.2   Macula Flat, good foveal reflex, scattered MA/IRH/exudate - improved, +ERM flat under oil, mild interval increase in nasal edema, mild IRH, scattered DBH   Vessels Vascular attenuation, Tortuous, regressing fibrotic NV Attenuated, perivascular fibrosis IT arcades, Tortuous   Periphery Attached, scattered IRH attached under oil  Punctate SO emulsification; peripapillary fibrosis regressing under oil, good 360 PRP.        Refraction    Wearing Rx      Sphere Cylinder   Right -2.00 Sphere   Left -1.50 Sphere          IMAGING AND PROCEDURES  Imaging and Procedures for @TODAY @  OCT, Retina - OU - Both Eyes       Right Eye Quality was borderline. Central Foveal Thickness: 211. Progression has been stable. Findings include no SRF, intraretinal  hyper-reflective material, no IRF, normal foveal contour, vitreous traction, macular pucker, epiretinal membrane, preretinal fibrosis (Interval improvement in vitreous opacities).   Left Eye Quality was borderline. Central Foveal Thickness: 341. Progression has worsened. Findings  include abnormal foveal contour, epiretinal membrane, no SRF, no IRF, outer retinal atrophy (Attached under oil; nasal retinal thickening slightly increased).   Notes *Images captured and stored on drive  Diagnosis / Impression:  PDR OU OD: mild preretinal fibrosis/ERM/pucker/interval improvement in vitreous opactities OS: retina attached under oil; nasal retinal thickening slightly increased   Clinical management:  See below  Abbreviations: NFP - Normal foveal profile. CME - cystoid macular edema. PED - pigment epithelial detachment. IRF - intraretinal fluid. SRF - subretinal fluid. EZ - ellipsoid zone. ERM - epiretinal membrane. ORA - outer retinal atrophy. ORT - outer retinal tubulation. SRHM - subretinal hyper-reflective material         Intravitreal Injection, Pharmacologic Agent - OS - Left Eye       Time Out 04/20/2019. 3:41 PM. Confirmed correct patient, procedure, site, and patient consented.   Anesthesia Topical anesthesia was used. Anesthetic medications included Lidocaine 2%, Proparacaine 0.5%.   Procedure Preparation included 5% betadine to ocular surface, eyelid speculum. A supplied (32g) needle was used.   Injection:  1.25 mg Bevacizumab (AVASTIN) SOLN   NDC: 80998-338-25, Lot: [id number] , Expiration date: 06/19/2019   Route: Intravitreal, Site: Left Eye, Waste: 0 mL  Post-op Post injection exam found visual acuity of at least counting fingers. The patient tolerated the procedure well. There were no complications. The patient received written and verbal post procedure care education.                 ASSESSMENT/PLAN:    ICD-10-CM   1. 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 Traction detachment of left retina  H33.42   3. Vitreous hemorrhage of both eyes ([place])  H43.13   4. Retinal edema   H35.81 OCT, Retina - OU - Both Eyes  5. Essential hypertension  I10   6. Hypertensive retinopathy of both eyes  H35.033   7. Combined forms of age-related cataract of right eye  H25.811   8. Pseudophakia  Z96.1     1-4. 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 (OS > OD)  - s/p PRP OS (11.26.19)  - s/p PRP OD (06.01.20)  - lost to f/u following 11.26.19 appt due to complicated pregnancy, premature birth of baby w/ extended NICU stay and congenital heart defect, and post-partum health issues  - history of vision loss OS onset December 2019  - OS with chronic VH and TRD, likely present since Dec 2019  - s/p IVA OS #1 (06.23.20), #2 (07.02.20)  - s/p IVA OD #2 (11.30.20)  - s/p PPV/MP/EL/silicon oil + IVA OS for VH and TRD from DM1, 07.02.20             - doing well -- had cataract surgery OS with Dr. 20.02.20, 12.15.20 -- looks good, mild PCO             - retina attached under silicon oil -- interval increase in nasal edema OS  - mild SO emulsification             - IOP 19 OS -- pt has been off drops as she ran out  - restart  Cosopt and Brim BID OS             - avoid laying flat on back   - s/p fill in PRP and IVA OD 07.02.2020 in operating room  - new onset diffuse VH OD since ~11.26.20   - s/p IVA OD #2 (11.30.20) -- VH stably improved today  - VH precautions reviewed -- minimize activities, keep head elevated, avoid ASA/NSAIDs/blood thinners as able             - recommend IVA #3 OS today (04.02.21) for increased nasal edema OS  - RBA of procedure discussed, questions answered  - informed consent obtained and signed  - see procedure note  - f/u 4 weeks/DFE/OCT  - discussed possibility of silicon oil removal OS in the coming months   5,6. Hypertensive retinopathy OU  - there was concern for pregnancy related hypertension (I.e. Pre-eclampsia, Eclampsia, HELLP, etc) during pregnancy  - also during pregnancy (7.17.2019), had bilateral CRVO w/ CME (OD > OS) s/p PRP OS  -  discussed importance of tight BP control  - monitor  7. Mixed cataract OD  - The symptoms of cataract, surgical options, and treatments and risks were discussed with patient.  -not visually significant  8. Pseudophakia OS  - s/p CE/IOL OS w/ Dr. Eulas Post, 12.15.2020  - beautiful surgery, doing well  - mild PCO  - monitor -- discussed possibility of YAG laser in office or capsulotomy at the time of oil removal OS    Ophthalmic Meds Ordered this visit:  Meds ordered this encounter  Medications  . Bevacizumab (AVASTIN) SOLN 1.25 mg      Return in about 4 weeks (around 05/18/2019) for PDR OU, Dilated exam, OCT.  There are no Patient Instructions on file for this visit.   Explained the diagnoses, plan, and follow up with the patient and they expressed understanding.  Patient expressed understanding of the importance of proper follow up care.  This document serves as a record of services personally performed by Gardiner Sleeper, MD, PhD. It was created on their behalf by Ernest Mallick, OA, an ophthalmic assistant. The creation of this record is the provider's dictation and/or activities during the visit.    Electronically signed by: Ernest Mallick, OA 04.02.2021 4:11 PM   Gardiner Sleeper, M.D., Ph.D. Diseases & Surgery of the Retina and Vitreous [facility]   I have reviewed the above documentation for accuracy and completeness, and I agree with the above. Gardiner Sleeper, M.D., Ph.D. 04/20/19 4:11 PM    Abbreviations: M myopia (nearsighted); A astigmatism; H hyperopia (farsighted); P presbyopia; Mrx spectacle prescription;  CTL contact lenses; OD right eye; OS left eye; OU both eyes  XT exotropia; ET esotropia; PEK punctate epithelial keratitis; PEE punctate epithelial erosions; DES dry eye syndrome; MGD meibomian gland dysfunction; ATs artificial tears; PFAT's preservative free artificial tears; [place] nuclear sclerotic cataract; PSC posterior subcapsular cataract;  ERM epi-retinal membrane; PVD posterior vitreous detachment; RD retinal detachment; DM diabetes mellitus; DR diabetic retinopathy; NPDR non-proliferative diabetic retinopathy; PDR proliferative diabetic retinopathy; CSME clinically significant macular edema; DME diabetic macular edema; dbh dot blot hemorrhages; CWS cotton wool spot; POAG primary open angle glaucoma; C/D cup-to-disc ratio; HVF humphrey visual field; GVF goldmann visual field; OCT optical coherence tomography; IOP intraocular pressure; BRVO Branch retinal vein occlusion; CRVO central retinal vein occlusion; CRAO central retinal artery occlusion; BRAO branch retinal artery occlusion; RT retinal tear; SB scleral buckle; PPV pars plana vitrectomy;  VH Vitreous hemorrhage; PRP panretinal laser photocoagulation; IVK intravitreal kenalog; VMT vitreomacular traction; MH Macular hole;  NVD neovascularization of the disc; NVE neovascularization elsewhere; AREDS age related eye disease study; ARMD age related macular degeneration; POAG primary open angle glaucoma; EBMD epithelial/anterior basement membrane dystrophy; ACIOL anterior chamber intraocular lens; IOL intraocular lens; PCIOL posterior chamber intraocular lens; Phaco/IOL phacoemulsification with intraocular lens placement; [place] photorefractive keratectomy; LASIK laser assisted in situ keratomileusis; HTN hypertension; DM diabetes mellitus; COPD chronic obstructive pulmonary disease

## 2019-04-23 ENCOUNTER — Telehealth: Payer: Self-pay | Admitting: Nutrition

## 2019-04-23 NOTE — Telephone Encounter (Signed)
Patient not able to keep appointment this week with me.  I will try to be her with her appointment with Dr. Loanne Drilling on Friday the 16th,  to give her new infusion sets to try as well as discuss different sites to use for infusion set placements.

## 2019-04-25 ENCOUNTER — Other Ambulatory Visit: Payer: Self-pay | Admitting: Endocrinology

## 2019-04-25 ENCOUNTER — Ambulatory Visit: Payer: BC Managed Care – PPO | Admitting: Nutrition

## 2019-04-26 ENCOUNTER — Encounter: Payer: Self-pay | Admitting: Pharmacist

## 2019-04-26 NOTE — Assessment & Plan Note (Addendum)
Blood pressure remains above goal and furosemide was HELD by Dr Oval Linsey due to increase in Scr. [name] presents and patient is very worried about managing her health.   We spent a good potion of the visit talking about positive lifestyle modifications. Recommend to start at small increments like walking 10 minutes per day 3-4 times a day and decreasing sodium in her diet by avopiding the "salty six". Encouraged to elevated her legs while sitting at home and to take furosemide every 3 days.  Will increase hydralazine to 50mg  TID, increase hydration by adding 1 more glass of water per day, repeat BMET in 2 weeks, and follow up with HTN clinic in 4 weeks.

## 2019-05-04 ENCOUNTER — Ambulatory Visit: Payer: BC Managed Care – PPO | Admitting: Endocrinology

## 2019-05-11 NOTE — Progress Notes (Addendum)
[facility] Note  05/18/2019     CHIEF COMPLAINT Patient presents for Retina Follow Up   HISTORY OF PRESENT ILLNESS: Barbara Donovan is a 29 y.o. female who presents to the clinic today for:   HPI    Retina Follow Up    Patient presents with  Diabetic Retinopathy.  In both eyes.  This started 4 weeks ago.  Severity is moderate.  I, the attending physician,  performed the HPI with the patient and updated documentation appropriately.          Comments    Patient here for 4 weeks retina follow up for PDR OU. Patient states vision doing the same. No eye pain. Was taken of a BP med and added a cholesterol med.       Last edited by Bernarda Caffey, MD on 05/18/2019  3:35 PM. (History)     Patient states vision the same OU.  Referring physician: Fanny Bien, MD [address] [place],  [place] [address]  HISTORICAL INFORMATION:   Selected notes from the MEDICAL RECORD NUMBER Referred by Dr. [name] for concern of bilateral CRVO LEE-  Ocular Hx-  PMH-     CURRENT MEDICATIONS: Current Outpatient Medications (Ophthalmic Drugs)  Medication Sig  . brimonidine (ALPHAGAN) 0.15 % ophthalmic solution Place 1 drop into the left eye every 8 (eight) hours.  . dorzolamide-timolol (COSOPT) 22.3-6.8 MG/ML ophthalmic solution Place 1 drop into the left eye 2 (two) times daily.   No current facility-administered medications for this visit. (Ophthalmic Drugs)   Current Outpatient Medications (Other)  Medication Sig  . Blood Pressure Monitoring (OMRON 3 SERIES BP MONITOR) DEVI 1 each by Does not apply route daily. Take daily blood pressure. Please report back to Dr. Radford Pax in 2 wks.  . carvedilol (COREG) 25 MG tablet Take 1 tablet (25 mg total) by mouth 2 (two) times daily.  Marland Kitchen diltiazem (CARDIZEM CD) 240 MG 24 hr capsule Take 1 capsule (240 mg total) by mouth daily.  . furosemide (LASIX) 20 MG tablet Take 2 tablets (40 mg total) by mouth daily as  needed for edema.  . hydrALAZINE (APRESOLINE) 50 MG tablet Take 1 tablet (50 mg total) by mouth 3 (three) times daily.  . insulin aspart (NOVOLOG) 100 UNIT/ML injection FOR USE IN PUMP, TOTAL OF 60 UNITS PER DAY.  Marland Kitchen Insulin Pen Needle (PEN NEEDLES) 32G X 5 MM MISC 1 Device by Does not apply route 4 (four) times daily.  . irbesartan (AVAPRO) 75 MG tablet Take 1 tablet (75 mg total) by mouth daily.  . Multiple Vitamin (MULTIVITAMIN WITH MINERALS) TABS tablet Take 1 tablet by mouth daily.  . OMEGA-3 FATTY ACIDS PO Take 2 capsules by mouth daily.  [name] VERIO test strip USE AS DIRECTED 5 TIMES DAILY  . sertraline (ZOLOFT) 25 MG tablet Take 1 tablet (25 mg total) by mouth daily.   No current facility-administered medications for this visit. (Other)      REVIEW OF SYSTEMS: ROS    Positive for: Genitourinary, Endocrine, Eyes, Respiratory   Negative for: Constitutional, Gastrointestinal, Neurological, Skin, Musculoskeletal, HENT, Cardiovascular, Psychiatric, Allergic/Imm, Heme/Lymph   Last edited by Theodore Demark, COA on 05/18/2019  2:46 PM. (History)       ALLERGIES No Known Allergies  PAST MEDICAL HISTORY Past Medical History:  Diagnosis Date  . Asthma 10/30/2013  . Cataract    NS OU  . Chronic hypertension during pregnancy, antepartum 08/19/2017  .  Dehydration 01/28/2018  . Depression during pregnancy, antepartum 07/07/2017   6/20: Short trial of zoloft previously, reports didn't help much but also didn't give it a chance Discussed r/b/a SSRIs in pregnancy, agrees to try Zoloft again, rx sent No SI/HI/red flags  . Diabetes ([place])    TYPE I  . Diabetic retinopathy ([place]) 06/09/2017   07/2017 with bilateral severe diabetic non-proliferative retinopathy with macular edema.  Marland Kitchen HTN (hypertension)   . Hypertensive retinopathy    OU  . Hypokalemia 01/22/2018  . Hypomagnesemia 01/28/2018  . Intractable nausea and vomiting 01/22/2018  . Intrauterine growth restriction (IUGR) affecting  care of mother 12/22/2017  . Morbid obesity ([place])   . 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 Proteinuria due to type 1 diabetes mellitus ([place]) 11/02/2017   Baseline Pr: Cr 10.23  . Severe hyperemesis gravidarum 10/30/2017  . Type I diabetes mellitus ([place]) 07/07/2017   Current Diabetic Medications:  Insulin  [x]  Aspirin 81 mg daily after 12 weeks (? A2/B GDM)  Required Referrals for A1GDM or A2GDM: [x]  Diabetes Education and Testing Supplies [x]  Nutrition Cousult  For A2/B GDM or higher classes of DM [x]  Diabetes Education and Testing Supplies [x]  Nutrition Counsult [x]  Fetal ECHO after 20 weeks  [x]  Eye exam for retina evaluation - severe retinopathy 7/19  Base  . Ventricular septal defect (VSD) of fetus in singleton pregnancy, antepartum 09/30/2017   May go to newborn nursery per Dr. Lenard Simmer Echo prior to discharge   Past Surgical History:  Procedure Laterality Date  . [place] VITRECTOMY WITH 20 GAUGE MVR PORT FOR MACULAR HOLE Left 07/20/2018   Procedure: 25 GAUGE PARS PLANA VITRECTOMY LEFT EYE;  Surgeon: Bernarda Caffey, MD;  Location: [facility];  Service: Ophthalmology;  Laterality: Left;  . CESAREAN SECTION N/A 12/26/2017   Procedure: CESAREAN SECTION;  Surgeon: Osborne Oman, MD;  Location: [facility];  Service: Obstetrics;  Laterality: N/A;  . EYE EXAMINATION UNDER ANESTHESIA Right 07/20/2018   Procedure: Eye Exam Under Anesthesia RIGHT EYE;  Surgeon: Bernarda Caffey, MD;  Location: [facility];  Service: Ophthalmology;  Laterality: Right;  . EYE SURGERY    . INJECTION OF SILICONE OIL Left 0/01/930   Procedure: Injection Of Silicone Oil LEFT EYE;  Surgeon: Bernarda Caffey, MD;  Location: [facility];  Service: Ophthalmology;  Laterality: Left;  . LASER PHOTO ABLATION Right 07/20/2018   Procedure: Laser Photo Ablation RIGHT EYE;  Surgeon: Bernarda Caffey, MD;  Location: [facility];  Service: Ophthalmology;  Laterality: Right;  . MEMBRANE PEEL Left 07/20/2018   Procedure: Membrane Peel LEFT EYE;   Surgeon: Bernarda Caffey, MD;  Location: [facility];  Service: Ophthalmology;  Laterality: Left;  . MITOMYCIN C APPLICATION Bilateral 03/23/5730   Procedure: Avastin Application;  Surgeon: Bernarda Caffey, MD;  Location: [facility];  Service: Ophthalmology;  Laterality: Bilateral;  . NO PAST SURGERIES    . PHOTOCOAGULATION WITH LASER Left 07/20/2018   Procedure: Photocoagulation With Laser LEFT EYE;  Surgeon: Bernarda Caffey, MD;  Location: [facility];  Service: Ophthalmology;  Laterality: Left;  . RETINAL DETACHMENT SURGERY Left 07/20/2018   Dr. Bernarda Caffey  . WISDOM TOOTH EXTRACTION      FAMILY HISTORY Family History  Problem Relation Age of Onset  . Diabetes Mother   . Aneurysm Mother 50  . Seizures Mother   . Diabetes Father   . Cataracts Father   . COPD Father   . Heart attack Father   . Heart disease Father   .  Healthy Sister   . Healthy Daughter   . Stroke Maternal Grandfather   . Amblyopia Neg Hx   . Blindness Neg Hx   . Glaucoma Neg Hx   . Macular degeneration Neg Hx   . Retinal detachment Neg Hx   . Strabismus Neg Hx   . Retinitis pigmentosa Neg Hx     SOCIAL HISTORY Social History   Tobacco Use  . Smoking status: Never Smoker  . Smokeless tobacco: Never Used  Substance Use Topics  . Alcohol use: Not Currently    Comment: SOCIALLY  . Drug use: Never         OPHTHALMIC EXAM:  Base Eye Exam    Visual Acuity (Snellen - Linear)      Right Left   Dist cc 20/20 -1 20/800   Dist ph cc  20/200 +2   Correction: Glasses       Tonometry (Tonopen, 2:42 PM)      Right Left   Pressure 13 12       Pupils      Dark Light Shape React APD   Right 4 3 Round Brisk None   Left 6 6 Round None None       Visual Fields (Counting fingers)      Left Right     Full   Restrictions Total superior temporal, superior nasal deficiencies        Extraocular Movement      Right Left    Full Full       Neuro/Psych    Oriented x3: Yes   Mood/Affect: Normal       Dilation    Both  eyes: 1.0% Mydriacyl, 2.5% Phenylephrine @ 2:42 PM        Slit Lamp and Fundus Exam    Slit Lamp Exam      Right Left   Lids/Lashes Normal Normal   Conjunctiva/Sclera White and quiet white,quiet; sutures dissolved   Cornea trace Punctate epithelial erosions Well healed cataract wounds, trade PEE   Anterior Chamber Deep and quiet Deep and quiet   Iris Round, moderate dilation, No NVI Round and dilated to 6.51mm, No NVI   Lens trace Nuclear sclerosis and cortical changes PCIOL in good position; 2-3+ PCO   Vitreous VH cleared centrally.  Blood stained vitreous condensations and clots settled inferiorly good oil fill       Fundus Exam      Right Left   Disc Pink and Sharp, trace fibrosis at 0130 Pink and sharp   C/D Ratio 0.3 0.2   Macula Flat, good foveal reflex, scattered MA/IRH/exudate - improved, +ERM flat under oil, persistent nasal ERM w/edema, mild IRH, scattered DBH   Vessels Vascular attenuation, Tortuous, regressing fibrotic NV Attenuated, perivascular fibrosis IT arcades, Tortuous   Periphery Attached, scattered IRH attached under oil; Punctate SO emulsification; peripapillary fibrosis regressing under oil, good 360 PRP.        Refraction    Wearing Rx      Sphere Cylinder   Right -2.00 Sphere   Left -1.50 Sphere          IMAGING AND PROCEDURES  Imaging and Procedures for @TODAY @  OCT, Retina - OU - Both Eyes       Right Eye Quality was borderline. Central Foveal Thickness: 212. Progression has been stable. Findings include no SRF, intraretinal hyper-reflective material, no IRF, normal foveal contour, vitreous traction, macular pucker, epiretinal membrane, preretinal fibrosis (Interval improvement in vitreous opacities).   Left Eye  Quality was borderline. Central Foveal Thickness: 417. Progression has been stable. Findings include abnormal foveal contour, epiretinal membrane, no SRF, no IRF, outer retinal atrophy (Attached under oil; nasal retinal thickening  slightly increased, mil persistent cystic changes within retinal thickening).   Notes *Images captured and stored on drive  Diagnosis / Impression:  PDR OU OD: mild preretinal fibrosis/ERM/pucker/interval improvement in vitreous opactities OS: retina attached under oil; nasal retinal thickening persistent; mild, persistent cystic changes within retinal thickening   Clinical management:  See below  Abbreviations: NFP - Normal foveal profile. CME - cystoid macular edema. PED - pigment epithelial detachment. IRF - intraretinal fluid. SRF - subretinal fluid. EZ - ellipsoid zone. ERM - epiretinal membrane. ORA - outer retinal atrophy. ORT - outer retinal tubulation. SRHM - subretinal hyper-reflective material                  ASSESSMENT/PLAN:    ICD-10-CM   1. 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 Traction detachment of left retina  H33.42   3. Vitreous hemorrhage of both eyes ([place])  H43.13   4. Epiretinal membrane (ERM) of left eye  H35.372   5. Retinal edema  H35.81 OCT, Retina - OU - Both Eyes  6. Essential hypertension  I10   7. Hypertensive retinopathy of both eyes  H35.033   8. Combined forms of age-related cataract of right eye  H25.811   9. Pseudophakia  Z96.1     1-3. 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 (OS > OD)  - s/p PRP OS (11.26.19)  - s/p PRP OD (06.01.20)  - lost to f/u following 11.26.19 appt due to complicated pregnancy, premature birth of baby w/ extended NICU stay and congenital heart defect, and post-partum health issues  - history of vision loss OS onset December 2019  - OS with chronic VH and TRD, likely present since Dec 2019  - s/p IVA OS #1 (06.23.20), #2 (07.02.20), #3 (04.02.21)  - s/p IVA OD #2 (11.30.20)  - s/p PPV/MP/EL/silicon oil + IVA OS for VH and TRD from DM1, 07.02.20             -  doing well -- had cataract surgery OS with Dr. Eulas Post, 12.15.20 -- looks good, mild PCO             - retina attached under silicon oil -- persistent nasal ERM w/ edema OS  - mild SO emulsification             - IOP 12 OS --better since re-starting cosopt and brimonidine             - avoid laying flat on back   - s/p fill in PRP and IVA OD 07.02.2020 in operating room  - new onset diffuse VH OD since ~11.26.20   - s/p IVA OD #2 (11.30.20) -- VH stably improved today  - VH precautions reviewed -- minimize activities, keep head elevated, avoid ASA/NSAIDs/blood thinners as able             - recommend holding IVA OS today, as previous treatment did not help vision and edema mostly from ERM not CME or DME   - f/u 4 weeks/DFE/OCT  - discussed silicone oil removal OS--plan for June 2021 -- will likely involve ERM peel  4,5. Epiretinal membrane, left eye  - The natural history, anatomy, potential  for loss of vision, and treatment options including vitrectomy techniques and the complications of endophthalmitis, retinal detachment, vitreous hemorrhage, cataract progression and permanent vision loss discussed with the patient. - focal ERM with retinal thickening nasal macula - will likely peel ERM during PPV w/ oil removal   6,7. Hypertensive retinopathy OU  - there was concern for pregnancy related hypertension (I.e. Pre-eclampsia, Eclampsia, HELLP, etc) during pregnancy  - also during pregnancy (7.17.2019), had bilateral CRVO w/ CME (OD > OS) s/p PRP OS  - discussed importance of tight BP control  - monitor  7. Mixed cataract OD  - The symptoms of cataract, surgical options, and treatments and risks were discussed with patient.  - not visually significant  9. Pseudophakia OS  - s/p CE/IOL OS w/ Dr. Eulas Post, 12.15.2020  - beautiful surgery, doing well  - mild PCO  - monitor -- discussed possibility of YAG laser in office or capsulotomy at the time of oil removal OS    Ophthalmic Meds Ordered  this visit:  No orders of the defined types were placed in this encounter.     Return in about 4 weeks (around 06/15/2019) for DFE, OCT.  There are no Patient Instructions on file for this visit.   Explained the diagnoses, plan, and follow up with the patient and they expressed understanding.  Patient expressed understanding of the importance of proper follow up care.  This document serves as a record of services personally performed by Gardiner Sleeper, MD, PhD. It was created on their behalf by Ernest Mallick, OA, an ophthalmic assistant. The creation of this record is the provider's dictation and/or activities during the visit.    Electronically signed by: Ernest Mallick, OA 04.23.2021 11:05 PM   Gardiner Sleeper, M.D., Ph.D. Diseases & Surgery of the Retina and Vitreous [facility]  I have reviewed the above documentation for accuracy and completeness, and I agree with the above. Gardiner Sleeper, M.D., Ph.D. 05/20/19 11:05 PM    Abbreviations: M myopia (nearsighted); A astigmatism; H hyperopia (farsighted); P presbyopia; Mrx spectacle prescription;  CTL contact lenses; OD right eye; OS left eye; OU both eyes  XT exotropia; ET esotropia; PEK punctate epithelial keratitis; PEE punctate epithelial erosions; DES dry eye syndrome; MGD meibomian gland dysfunction; ATs artificial tears; PFAT's preservative free artificial tears; [place] nuclear sclerotic cataract; PSC posterior subcapsular cataract; ERM epi-retinal membrane; PVD posterior vitreous detachment; RD retinal detachment; DM diabetes mellitus; DR diabetic retinopathy; NPDR non-proliferative diabetic retinopathy; PDR proliferative diabetic retinopathy; CSME clinically significant macular edema; DME diabetic macular edema; dbh dot blot hemorrhages; CWS cotton wool spot; POAG primary open angle glaucoma; C/D cup-to-disc ratio; HVF humphrey visual field; GVF goldmann visual field; OCT optical coherence tomography; IOP  intraocular pressure; BRVO Branch retinal vein occlusion; CRVO central retinal vein occlusion; CRAO central retinal artery occlusion; BRAO branch retinal artery occlusion; RT retinal tear; SB scleral buckle; PPV pars plana vitrectomy; VH Vitreous hemorrhage; PRP panretinal laser photocoagulation; IVK intravitreal kenalog; VMT vitreomacular traction; MH Macular hole;  NVD neovascularization of the disc; NVE neovascularization elsewhere; AREDS age related eye disease study; ARMD age related macular degeneration; POAG primary open angle glaucoma; EBMD epithelial/anterior basement membrane dystrophy; ACIOL anterior chamber intraocular lens; IOL intraocular lens; PCIOL posterior chamber intraocular lens; Phaco/IOL phacoemulsification with intraocular lens placement; [place] photorefractive keratectomy; LASIK laser assisted in situ keratomileusis; HTN hypertension; DM diabetes mellitus; COPD chronic obstructive pulmonary disease

## 2019-05-17 ENCOUNTER — Ambulatory Visit: Payer: BC Managed Care – PPO

## 2019-05-18 ENCOUNTER — Ambulatory Visit (INDEPENDENT_AMBULATORY_CARE_PROVIDER_SITE_OTHER): Payer: BC Managed Care – PPO | Admitting: Ophthalmology

## 2019-05-18 ENCOUNTER — Encounter (INDEPENDENT_AMBULATORY_CARE_PROVIDER_SITE_OTHER): Payer: Self-pay | Admitting: Ophthalmology

## 2019-05-18 DIAGNOSIS — H3581 Retinal edema: Secondary | ICD-10-CM | POA: Diagnosis not present

## 2019-05-18 DIAGNOSIS — H4313 Vitreous hemorrhage, bilateral: Secondary | ICD-10-CM

## 2019-05-18 DIAGNOSIS — H3342 Traction detachment of retina, left eye: Secondary | ICD-10-CM | POA: Diagnosis not present

## 2019-05-18 DIAGNOSIS — H35033 Hypertensive retinopathy, bilateral: Secondary | ICD-10-CM

## 2019-05-18 DIAGNOSIS — E103513 Type 1 diabetes mellitus with proliferative diabetic retinopathy with macular edema, bilateral: Secondary | ICD-10-CM

## 2019-05-18 DIAGNOSIS — H25811 Combined forms of age-related cataract, right eye: Secondary | ICD-10-CM

## 2019-05-18 DIAGNOSIS — I10 Essential (primary) hypertension: Secondary | ICD-10-CM

## 2019-05-18 DIAGNOSIS — H35372 Puckering of macula, left eye: Secondary | ICD-10-CM

## 2019-05-18 DIAGNOSIS — Z961 Presence of intraocular lens: Secondary | ICD-10-CM

## 2019-05-20 ENCOUNTER — Encounter (INDEPENDENT_AMBULATORY_CARE_PROVIDER_SITE_OTHER): Payer: Self-pay | Admitting: Ophthalmology

## 2019-06-19 ENCOUNTER — Encounter (INDEPENDENT_AMBULATORY_CARE_PROVIDER_SITE_OTHER): Payer: Self-pay | Admitting: Ophthalmology

## 2019-06-19 ENCOUNTER — Other Ambulatory Visit: Payer: Self-pay

## 2019-06-19 ENCOUNTER — Ambulatory Visit (INDEPENDENT_AMBULATORY_CARE_PROVIDER_SITE_OTHER): Payer: BC Managed Care – PPO | Admitting: Ophthalmology

## 2019-06-19 DIAGNOSIS — H4313 Vitreous hemorrhage, bilateral: Secondary | ICD-10-CM | POA: Diagnosis not present

## 2019-06-19 DIAGNOSIS — H35033 Hypertensive retinopathy, bilateral: Secondary | ICD-10-CM

## 2019-06-19 DIAGNOSIS — E103513 Type 1 diabetes mellitus with proliferative diabetic retinopathy with macular edema, bilateral: Secondary | ICD-10-CM | POA: Diagnosis not present

## 2019-06-19 DIAGNOSIS — H35372 Puckering of macula, left eye: Secondary | ICD-10-CM

## 2019-06-19 DIAGNOSIS — H3581 Retinal edema: Secondary | ICD-10-CM

## 2019-06-19 DIAGNOSIS — H3342 Traction detachment of retina, left eye: Secondary | ICD-10-CM

## 2019-06-19 DIAGNOSIS — H25811 Combined forms of age-related cataract, right eye: Secondary | ICD-10-CM

## 2019-06-19 DIAGNOSIS — I10 Essential (primary) hypertension: Secondary | ICD-10-CM

## 2019-06-19 DIAGNOSIS — Z961 Presence of intraocular lens: Secondary | ICD-10-CM

## 2019-06-19 NOTE — Progress Notes (Signed)
[facility] Note  06/19/2019     CHIEF COMPLAINT Patient presents for Retina Follow Up   HISTORY OF PRESENT ILLNESS: Barbara Donovan is a 29 y.o. female who presents to the clinic today for:   HPI    Retina Follow Up    Patient presents with  Diabetic Retinopathy.  In both eyes.  This started 4 weeks ago.  Severity is moderate.  I, the attending physician,  performed the HPI with the patient and updated documentation appropriately.          Comments    Patient here for 4 weeks retina follow up for DFE OU. Patient states vision about the same. No eye pain.        Last edited by Bernarda Caffey, MD on 06/19/2019  5:05 PM. (History)    pt states no changes in vision   Referring physician: Fanny Bien, MD [address] [place],  [place] [address]  HISTORICAL INFORMATION:   Selected notes from the MEDICAL RECORD NUMBER Referred by Dr. [name] for concern of bilateral CRVO    CURRENT MEDICATIONS: Current Outpatient Medications (Ophthalmic Drugs)  Medication Sig   brimonidine (ALPHAGAN) 0.15 % ophthalmic solution Place 1 drop into the left eye every 8 (eight) hours.   dorzolamide-timolol (COSOPT) 22.3-6.8 MG/ML ophthalmic solution Place 1 drop into the left eye 2 (two) times daily.   No current facility-administered medications for this visit. (Ophthalmic Drugs)   Current Outpatient Medications (Other)  Medication Sig   Blood Pressure Monitoring (OMRON 3 SERIES BP MONITOR) DEVI 1 each by Does not apply route daily. Take daily blood pressure. Please report back to Dr. Radford Pax in 2 wks.   carvedilol (COREG) 25 MG tablet Take 1 tablet (25 mg total) by mouth 2 (two) times daily.   diltiazem (CARDIZEM CD) 240 MG 24 hr capsule Take 1 capsule (240 mg total) by mouth daily.   furosemide (LASIX) 20 MG tablet Take 2 tablets (40 mg total) by mouth daily as needed for edema.   hydrALAZINE (APRESOLINE) 50 MG tablet Take 1 tablet (50 mg total)  by mouth 3 (three) times daily.   insulin aspart (NOVOLOG) 100 UNIT/ML injection FOR USE IN PUMP, TOTAL OF 60 UNITS PER DAY.   Insulin Pen Needle (PEN NEEDLES) 32G X 5 MM MISC 1 Device by Does not apply route 4 (four) times daily.   irbesartan (AVAPRO) 75 MG tablet Take 1 tablet (75 mg total) by mouth daily.   Multiple Vitamin (MULTIVITAMIN WITH MINERALS) TABS tablet Take 1 tablet by mouth daily.   OMEGA-3 FATTY ACIDS PO Take 2 capsules by mouth daily.   ONETOUCH VERIO test strip USE AS DIRECTED 5 TIMES DAILY   sertraline (ZOLOFT) 25 MG tablet Take 1 tablet (25 mg total) by mouth daily.   No current facility-administered medications for this visit. (Other)      REVIEW OF SYSTEMS: ROS    Positive for: Genitourinary, Endocrine, Eyes, Respiratory   Negative for: Constitutional, Gastrointestinal, Neurological, Skin, Musculoskeletal, HENT, Cardiovascular, Psychiatric, Allergic/Imm, Heme/Lymph   Last edited by Theodore Demark, COA on 06/19/2019  2:47 PM. (History)       ALLERGIES No Known Allergies  PAST MEDICAL HISTORY Past Medical History:  Diagnosis Date   Asthma 10/30/2013   Cataract    NS OU   Chronic hypertension during pregnancy, antepartum 08/19/2017   Dehydration 01/28/2018   Depression during pregnancy, antepartum 07/07/2017   6/20: Short trial of  zoloft previously, reports didn't help much but also didn't give it a chance Discussed r/b/a SSRIs in pregnancy, agrees to try Zoloft again, rx sent No SI/HI/red flags   Diabetes ([place])    TYPE I   Diabetic retinopathy ([place]) 06/09/2017   07/2017 with bilateral severe diabetic non-proliferative retinopathy with macular edema.   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 A2/B GDM)  Required Referrals for A1GDM or A2GDM: [x]  Diabetes Education and Testing Supplies [x]  Nutrition Cousult  For A2/B GDM or higher classes of DM [x]  Diabetes Education and Testing Supplies [x]  Nutrition Counsult [x]  Fetal ECHO after 20 weeks  [x]  Eye exam for retina evaluation - severe retinopathy 7/19  Base   Ventricular septal defect (VSD) of fetus in singleton pregnancy, antepartum 09/30/2017   May go to newborn nursery per Dr. Lenard Simmer Echo prior to discharge   Past Surgical History:  Procedure Laterality Date   25 GAUGE PARS PLANA VITRECTOMY WITH 20 GAUGE MVR PORT FOR MACULAR HOLE Left 07/20/2018   Procedure: 25 GAUGE PARS PLANA VITRECTOMY LEFT EYE;  Surgeon: Bernarda Caffey, MD;  Location: [facility];  Service: Ophthalmology;  Laterality: Left;   CESAREAN SECTION N/A 12/26/2017   Procedure: CESAREAN SECTION;  Surgeon: Osborne Oman, MD;  Location: [facility];  Service: Obstetrics;  Laterality: N/A;   EYE EXAMINATION UNDER ANESTHESIA Right 07/20/2018   Procedure: Eye Exam Under Anesthesia RIGHT EYE;  Surgeon: Bernarda Caffey, MD;  Location: [facility];  Service: Ophthalmology;  Laterality: Right;   EYE SURGERY     INJECTION OF SILICONE OIL Left 01/23/1094   Procedure: Injection Of Silicone Oil LEFT EYE;  Surgeon: Bernarda Caffey, MD;  Location: [facility];  Service: Ophthalmology;  Laterality: Left;   LASER PHOTO ABLATION Right 07/20/2018   Procedure: Laser Photo Ablation RIGHT EYE;  Surgeon: Bernarda Caffey, MD;  Location: [facility];  Service: Ophthalmology;  Laterality: Right;   MEMBRANE PEEL Left 07/20/2018   Procedure: Membrane Peel LEFT EYE;  Surgeon: Bernarda Caffey, MD;  Location: [facility];  Service: Ophthalmology;  Laterality: Left;    MITOMYCIN C APPLICATION Bilateral 0/04/5407   Procedure: Avastin Application;  Surgeon: Bernarda Caffey, MD;  Location: [facility];  Service: Ophthalmology;  Laterality: Bilateral;   NO PAST SURGERIES     PHOTOCOAGULATION WITH LASER Left 07/20/2018   Procedure: Photocoagulation With Laser LEFT EYE;  Surgeon: Bernarda Caffey, MD;  Location: [facility];  Service: Ophthalmology;  Laterality: Left;   RETINAL DETACHMENT SURGERY Left 07/20/2018   Dr. Bernarda Caffey   WISDOM TOOTH EXTRACTION      FAMILY HISTORY Family History  Problem Relation Age of Onset   Diabetes Mother    Aneurysm Mother 66   Seizures Mother    Diabetes Father    Cataracts Father    COPD Father    Heart attack Father    Heart disease Father    Healthy Sister    Healthy Daughter    Stroke Maternal Grandfather  Amblyopia Neg Hx    Blindness Neg Hx    Glaucoma Neg Hx    Macular degeneration Neg Hx    Retinal detachment Neg Hx    Strabismus Neg Hx    Retinitis pigmentosa Neg Hx     SOCIAL HISTORY Social History   Tobacco Use   Smoking status: Never Smoker   Smokeless tobacco: Never Used  Substance Use Topics   Alcohol use: Not Currently    Comment: SOCIALLY   Drug use: Never         OPHTHALMIC EXAM:  Base Eye Exam    Visual Acuity (Snellen - Linear)      Right Left   Dist cc 20/20 -1 20/800   Dist ph cc  20/200 +1   Correction: Glasses       Tonometry (Tonopen, 2:44 PM)      Right Left   Pressure 23 23       Pupils      Dark Light Shape React APD   Right 4 3 Round Brisk None   Left 6 6 Round None None       Visual Fields (Counting fingers)      Left Right   Restrictions Total superior temporal, superior nasal deficiencies        Extraocular Movement      Right Left    Full, Ortho Full, Ortho       Neuro/Psych    Oriented x3: Yes   Mood/Affect: Normal       Dilation    Both eyes: 1.0% Mydriacyl, 2.5% Phenylephrine @ 2:44 PM        Slit Lamp and Fundus Exam     Slit Lamp Exam      Right Left   Lids/Lashes Normal Normal   Conjunctiva/Sclera White and quiet white,quiet; sutures dissolved   Cornea trace Punctate epithelial erosions Well healed cataract wounds, trade PEE   Anterior Chamber Deep and quiet Deep and quiet   Iris Round, moderate dilation, No NVI Round and dilated to 6.23mm, No NVI   Lens trace Nuclear sclerosis and cortical changes PCIOL in good position; 3+ PCO   Vitreous VH cleared centrally, Blood stained vitreous condensations and clots settled inferiorly - now white in color good oil fill       Fundus Exam      Right Left   Disc Pink and Sharp, trace fibrosis at 0130 Pink and sharp   C/D Ratio 0.3 0.3   Macula Flat, good foveal reflex, scattered MA/IRH/exudate - improved, +ERM flat under oil, persistent nasal ERM w/edema, mild IRH, scattered DBH   Vessels Vascular attenuation, Tortuous, regressing fibrotic NV Attenuated, perivascular fibrosis IT arcades, Tortuous   Periphery Attached, scattered IRH. 360 PRP attached under oil; focal patches of SO emulsification; peripapillary fibrosis regressing under oil, good 360 PRP.        Refraction    Wearing Rx      Sphere Cylinder   Right -2.00 Sphere   Left -1.50 Sphere          IMAGING AND PROCEDURES  Imaging and Procedures for @TODAY @  OCT, Retina - OU - Both Eyes       Right Eye Quality was borderline. Central Foveal Thickness: 217. Progression has been stable. Findings include no SRF, intraretinal hyper-reflective material, no IRF, normal foveal contour, vitreous traction, macular pucker, epiretinal membrane, preretinal fibrosis (stable improvement in vitreous opacities).   Left Eye Quality was borderline. Central Foveal Thickness: 474. Progression has been stable.  Findings include abnormal foveal contour, epiretinal membrane, no SRF, outer retinal atrophy, intraretinal fluid (Attached under oil; persistent nasal retinal thickening slightly increased).    Notes *Images captured and stored on drive  Diagnosis / Impression:  PDR OU OD: mild preretinal fibrosis/ERM/pucker/stable improvement in vitreous opactities OS: retina attached under oil; persistent nasal ERM w/ retinal thickening   Clinical management:  See below Abbreviations: NFP - Normal foveal profile. CME - cystoid macular edema. PED - pigment epithelial detachment. IRF - intraretinal fluid. SRF - subretinal fluid. EZ - ellipsoid zone. ERM - epiretinal membrane. ORA - outer retinal atrophy. ORT - outer retinal tubulation. SRHM - subretinal hyper-reflective material                  ASSESSMENT/PLAN:    ICD-10-CM   1. 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 Traction detachment of left retina  H33.42   3. Vitreous hemorrhage of both eyes ([place])  H43.13   4. Epiretinal membrane (ERM) of left eye  H35.372   5. Retinal edema  H35.81 OCT, Retina - OU - Both Eyes  6. Essential hypertension  I10   7. Hypertensive retinopathy of both eyes  H35.033   8. Combined forms of age-related cataract of right eye  H25.811   9. Pseudophakia  Z96.1     1-3. 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 (OS > OD)  - s/p PRP OS (11.26.19)  - s/p PRP OD (06.01.20)  - lost to f/u following 11.26.19 appt due to complicated pregnancy, premature birth of baby w/ extended NICU stay and congenital heart defect, and post-partum health issues  - history of vision loss OS onset December 2019  - OS with chronic VH and TRD, likely present since Dec 2019  - s/p IVA OS #1 (06.23.20), #2 (07.02.20), #3 (04.02.21)  - s/p IVA OD #2 (11.30.20)  - s/p PPV/MP/EL/silicon oil + IVA OS for VH and TRD from DM1, 07.02.20             - doing well -- had cataract surgery OS with Dr. Eulas Post, 12.15.20 -- looks good, mild PCO             - retina attached under silicon oil  -- persistent nasal ERM w/ edema OS  - mild SO emulsification             - IOP 23 OS --better since re-starting cosopt and brimonidine             - avoid laying flat on back   - s/p fill in PRP and IVA OD 07.02.2020 in operating room  - new onset diffuse VH OD since ~11.26.20   - s/p IVA OD #2 (11.30.20) -- VH stably improved today  - VH precautions reviewed -- minimize activities, keep head elevated, avoid ASA/NSAIDs/blood thinners as able             - recommend holding IVA OS today, as previous treatment did not help vision and edema mostly from ERM not CME or DME   - discussed silicon oil removal OS  - pt wishes to proceed with silicone oil removal/membrane peel on July 1  - RBA of procedure discussed, questions answered  - informed consent obtained and signed  - will schedule 25g PPV w/ SOR and membrane peel, possible air or gas OS under general anesthesia at [facility] for July 1  - f/u July 2 for POV  4,5. Epiretinal membrane, left eye   - focal ERM with retinal thickening nasal macula  - will peel ERM during PPV w/ oil removal   6,7. Hypertensive retinopathy OU  - there was concern for pregnancy related hypertension (I.e. Pre-eclampsia, Eclampsia, HELLP, etc) during pregnancy  - also during pregnancy (7.17.2019), had bilateral CRVO w/ CME (OD > OS) s/p PRP OS  - discussed importance of tight BP control  - monitor  8. Mixed cataract OD  - The symptoms of cataract, surgical options, and treatments and risks were discussed with patient.  - not visually significant  9. Pseudophakia OS  - s/p CE/IOL OS w/ Dr. Eulas Post, 12.15.2020  - beautiful surgery, doing well  - mild PCO  - monitor -- discussed possibility of YAG laser in office or capsulotomy at the time of oil removal OS    Ophthalmic Meds Ordered this visit:  No orders of the defined types were placed in this encounter.     Return for f/u July 2, POV.  There are no Patient Instructions on file for this  visit.   Explained the diagnoses, plan, and follow up with the patient and they expressed understanding.  Patient expressed understanding of the importance of proper follow up care.  This document serves as a record of services personally performed by Gardiner Sleeper, MD, PhD. It was created on their behalf by Estill Bakes, COT an ophthalmic technician. The creation of this record is the provider's dictation and/or activities during the visit.    Electronically signed by: Estill Bakes, COT 06/19/19 @ 5:09 PM   This document serves as a record of services personally performed by Gardiner Sleeper, MD, PhD. It was created on their behalf by Ernest Mallick, OA, an ophthalmic assistant. The creation of this record is the provider's dictation and/or activities during the visit.    Electronically signed by: Ernest Mallick, OA 06.01.2021 5:09 PM  Gardiner Sleeper, M.D., Ph.D. Diseases & Surgery of the Retina and [place] 06/19/2019   I have reviewed the above documentation for accuracy and completeness, and I agree with the above. Gardiner Sleeper, M.D., Ph.D. 06/19/19 5:09 PM   Abbreviations: M myopia (nearsighted); A astigmatism; H hyperopia (farsighted); P presbyopia; Mrx spectacle prescription;  CTL contact lenses; OD right eye; OS left eye; OU both eyes  XT exotropia; ET esotropia; PEK punctate epithelial keratitis; PEE punctate epithelial erosions; DES dry eye syndrome; MGD meibomian gland dysfunction; ATs artificial tears; PFAT's preservative free artificial tears; [place] nuclear sclerotic cataract; PSC posterior subcapsular cataract; ERM epi-retinal membrane; PVD posterior vitreous detachment; RD retinal detachment; DM diabetes mellitus; DR diabetic retinopathy; NPDR non-proliferative diabetic retinopathy; PDR proliferative diabetic retinopathy; CSME clinically significant macular edema; DME diabetic macular edema; dbh dot blot hemorrhages; CWS cotton wool spot; POAG  primary open angle glaucoma; C/D cup-to-disc ratio; HVF humphrey visual field; GVF goldmann visual field; OCT optical coherence tomography; IOP intraocular pressure; BRVO Branch retinal vein occlusion; CRVO central retinal vein occlusion; CRAO central retinal artery occlusion; BRAO branch retinal artery occlusion; RT retinal tear; SB scleral buckle; PPV pars plana vitrectomy; VH Vitreous hemorrhage; PRP panretinal laser photocoagulation; IVK intravitreal kenalog; VMT vitreomacular traction; MH Macular hole;  NVD neovascularization of the disc; NVE neovascularization elsewhere; AREDS age related eye disease study; ARMD age related macular degeneration; POAG primary open angle glaucoma; EBMD epithelial/anterior basement membrane dystrophy; ACIOL anterior chamber intraocular lens; IOL  intraocular lens; PCIOL posterior chamber intraocular lens; Phaco/IOL phacoemulsification with intraocular lens placement; [place] photorefractive keratectomy; LASIK laser assisted in situ keratomileusis; HTN hypertension; DM diabetes mellitus; COPD chronic obstructive pulmonary disease

## 2019-06-21 ENCOUNTER — Ambulatory Visit (INDEPENDENT_AMBULATORY_CARE_PROVIDER_SITE_OTHER): Payer: BC Managed Care – PPO | Admitting: Pharmacist

## 2019-06-21 ENCOUNTER — Other Ambulatory Visit: Payer: Self-pay

## 2019-06-21 VITALS — BP 154/90 | HR 84 | Temp 98.0°F | Resp 15 | Ht 66.0 in | Wt 208.0 lb

## 2019-06-21 DIAGNOSIS — I10 Essential (primary) hypertension: Secondary | ICD-10-CM | POA: Diagnosis not present

## 2019-06-21 MED ORDER — HYDRALAZINE HCL 50 MG PO TABS: ORAL_TABLET | ORAL | 1 refills | Status: DC

## 2019-06-21 NOTE — Progress Notes (Signed)
Patient ID: Barbara Donovan                 DOB: 01-23-1990                      MRN: [id number]     HPI: Barbara Donovan is a 29 y.o. female referred by Dr. Oval Linsey to HTN clinic. PMH includes asthma, post-partum depression, DM-I, detached retina, retinopathy of both eyes, and uncontrolled hypertension. Patient refused to increase irbesartan 75mg  to 150mg  during OV with Dr Oval Linsey. Hydralazine was added to therapy at 50mg  twice daily by Dr. Oval Linsey on 03/22/2019 and increased to 50mg  TID on 04/19/2019. Patient presents today to clinic and stopped taking irbesartan after PCP noted increased in Scr , but no alternative medication was added to regimen.  Also reports not checking her BP in the last 4 weeks and increased depression. She stopped taking sertraline because "it doesn't work" , but haven't follow up with PCP for alterative therapy. A1C greatly improved from 11.3 to 8.7 per patient history. Patient primary concern today is oncoming eye surgery.   Current HTN meds:  Carvedilol 25mg  twice daily (7am & 7pm) diltiazem 240mg  daily (7am) Furosemide 40mg  daily as needed - not taking Hydralazine 50mg  TID (7am, noon, 7pm) - increased 04/19/2019 Irbesartan 75mg  daily - not taking per PCP   Previously tried:  Enalapril - lack response HCTZ - hypokalemia, hypomagnesemia chlorthalidone - increase SCr Amlodipine Labetalol Nifedipine Spironolactone  BP goal: <130/80  Family History: The patient's family history includes Aneurysm (age of onset: 6) in her mother; COPD in her father; Cataracts in her father; Diabetes in her father and mother; Healthy in her daughter and sister; Heart attack in her father; Heart disease in her father; Seizures in her mother; Stroke in her maternal grandfather. There is no history of Amblyopia, Blindness, Glaucoma, Macular degeneration, Retinal detachment, Strabismus, or Retinitis pigmentosa.  Social History: denies tobacco use, report social alcohol use  Diet: working  on low sodium diet. Lunch is challenging if at work but she is trying to improve food choices.   Exercise: activities of daily living, plan to start walking more during the weekends  Home BP readings: none available   Wt Readings from Last 3 Encounters:  06/21/19 208 lb (94.3 kg)  04/19/19 201 lb 6.4 oz (91.4 kg)  03/22/19 200 lb (90.7 kg)   BP Readings from Last 3 Encounters:  06/21/19 (!) 154/90  04/19/19 (!) 144/68  03/22/19 (!) 142/82   Pulse Readings from Last 3 Encounters:  06/21/19 84  04/19/19 73  03/22/19 88    Past Medical History:  Diagnosis Date  . Asthma 10/30/2013  . Cataract    NS OU  . Chronic hypertension during pregnancy, antepartum 08/19/2017  . Dehydration 01/28/2018  . Depression during pregnancy, antepartum 07/07/2017   6/20: Short trial of zoloft previously, reports didn't help much but also didn't give it a chance Discussed r/b/a SSRIs in pregnancy, agrees to try Zoloft again, rx sent No SI/HI/red flags  . Diabetes ([place])    TYPE I  . Diabetic retinopathy ([place]) 06/09/2017   07/2017 with bilateral severe diabetic non-proliferative retinopathy with macular edema.  Marland Kitchen HTN (hypertension)   . Hypertensive retinopathy    OU  . Hypokalemia 01/22/2018  . Hypomagnesemia 01/28/2018  . Intractable nausea and vomiting 01/22/2018  . Intrauterine growth restriction (IUGR) affecting care of mother 12/22/2017  . Morbid obesity ([place])   . 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 Severe hyperemesis gravidarum 10/30/2017  . Type I diabetes mellitus ([place]) 07/07/2017   Current Diabetic Medications:  Insulin  [x]  Aspirin 81 mg daily after 12 weeks (? A2/B GDM)  Required Referrals for A1GDM or A2GDM: [x]  Diabetes Education and Testing Supplies [x]  Nutrition Cousult  For A2/B GDM or higher classes of DM [x]  Diabetes Education and Testing Supplies [x]  Nutrition Counsult [x]  Fetal ECHO after 20  weeks  [x]  Eye exam for retina evaluation - severe retinopathy 7/19  Base  . Ventricular septal defect (VSD) of fetus in singleton pregnancy, antepartum 09/30/2017   May go to newborn nursery per Dr. Lenard Simmer Echo prior to discharge    Current Outpatient Medications on File Prior to Visit  Medication Sig Dispense Refill  . Blood Pressure Monitoring (OMRON 3 SERIES BP MONITOR) DEVI 1 each by Does not apply route daily. Take daily blood pressure. Please report back to Dr. Radford Pax in 2 wks. 1 Device 0  . carvedilol (COREG) 25 MG tablet Take 1 tablet (25 mg total) by mouth 2 (two) times daily. 180 tablet 3  . diltiazem (CARDIZEM CD) 240 MG 24 hr capsule Take 1 capsule (240 mg total) by mouth daily. 30 capsule 6  . furosemide (LASIX) 20 MG tablet Take 2 tablets (40 mg total) by mouth daily as needed for edema. 30 tablet 1  . insulin aspart (NOVOLOG) 100 UNIT/ML injection FOR USE IN PUMP, TOTAL OF 60 UNITS PER DAY. 54 mL 0  . Insulin Pen Needle (PEN NEEDLES) 32G X 5 MM MISC 1 Device by Does not apply route 4 (four) times daily. 120 each 11  . norethindrone (MICRONOR) 0.35 MG tablet Take 1 tablet by mouth daily.    [name] VERIO test strip USE AS DIRECTED 5 TIMES DAILY 200 strip 2   No current facility-administered medications on file prior to visit.    No Known Allergies  Blood pressure (!) 154/90, pulse 84, temperature 98 F (36.7 C), resp. rate 15, height 5\' 6"  (1.676 m), weight 208 lb (94.3 kg), SpO2 99 %, unknown if currently breastfeeding.  Chronic hypertension BP worsen after irbesartan therapy stopped by PCP and not replacement therapy given. We don';t have home BP record to assess and patient is more concern about the incoming eye surgery than her BP at this time. She also reports increase depression , but stopped taking sertraline and never f/u with PCP for get alternative therapy.  Will increase hydralazine to 100mg  TID today, and follow up at HTN clinic in 4 weeks (after eye surgery).  Patient encouraged to contact PCP for further depression management and therapy adjustment. Plan to add ramipril or candesartan during next OV for better BP control and proteinuria management.     Ruby Dilone Rodriguez-Guzman PharmD, BCPS, [facility] [address] [place] [address] 06/25/2019 8:30 AM

## 2019-06-21 NOTE — Patient Instructions (Addendum)
Return for a  follow up appointment in 4 weeks  Go to the lab 2-3 days prior to appointment  Check your blood pressure at home daily (if able) and keep record of the readings.  Take your BP meds as follows: *INCREASE hydralazine to 100mg  in AM , 50mg  at noon, and 100mg  in PM*  Bring all of your meds, your BP cuff and your record of home blood pressures to your next appointment.  Exercise as you're able, try to walk approximately 30 minutes per day.  Keep salt intake to a minimum, especially watch canned and prepared boxed foods.  Eat more fresh fruits and vegetables and fewer canned items.  Avoid eating in fast food restaurants.    HOW TO TAKE YOUR BLOOD PRESSURE: . Rest 5 minutes before taking your blood pressure. .  Don't smoke or drink caffeinated beverages for at least 30 minutes before. . Take your blood pressure before (not after) you eat. . Sit comfortably with your back supported and both feet on the floor (don't cross your legs). . Elevate your arm to heart level on a table or a desk. . Use the proper sized cuff. It should fit smoothly and snugly around your bare upper arm. There should be enough room to slip a fingertip under the cuff. The bottom edge of the cuff should be 1 inch above the crease of the elbow. . Ideally, take 3 measurements at one sitting and record the average.

## 2019-06-25 ENCOUNTER — Encounter: Payer: Self-pay | Admitting: Pharmacist

## 2019-06-25 NOTE — Assessment & Plan Note (Signed)
BP worsen after irbesartan therapy stopped by PCP and not replacement therapy given. We don';t have home BP record to assess and patient is more concern about the incoming eye surgery than her BP at this time. She also reports increase depression , but stopped taking sertraline and never f/u with PCP for get alternative therapy.  Will increase hydralazine to 100mg  TID today, and follow up at HTN clinic in 4 weeks (after eye surgery). Patient encouraged to contact PCP for further depression management and therapy adjustment. Plan to add ramipril or candesartan during next OV for better BP control and proteinuria management.

## 2019-07-07 ENCOUNTER — Telehealth: Payer: Self-pay | Admitting: Endocrinology

## 2019-07-08 NOTE — Telephone Encounter (Signed)
1.  Please schedule f/u appt 2.  Then please refill x 2 mos, pending that appt.  

## 2019-07-10 ENCOUNTER — Other Ambulatory Visit: Payer: Self-pay

## 2019-07-10 DIAGNOSIS — E10319 Type 1 diabetes mellitus with unspecified diabetic retinopathy without macular edema: Secondary | ICD-10-CM

## 2019-07-10 MED ORDER — INSULIN ASPART 100 UNIT/ML ~~LOC~~ SOLN: SUBCUTANEOUS | 0 refills | Status: DC

## 2019-07-10 NOTE — Telephone Encounter (Signed)
Patient has been scheduled for an appointment 08/08/19 1:00 PM

## 2019-07-10 NOTE — Telephone Encounter (Signed)
Outpatient Medication Detail   Disp Refills Start End   insulin aspart (NOVOLOG) 100 UNIT/ML injection 36 mL 0 07/10/2019    Sig: FOR USE IN PUMP, TOTAL OF 60 UNITS PER DAY.   Sent to pharmacy as: insulin aspart (NOVOLOG) 100 UNIT/ML injection   E-Prescribing Status: Receipt confirmed by pharmacy (07/10/2019  3:16 PM EDT)

## 2019-07-16 ENCOUNTER — Other Ambulatory Visit (HOSPITAL_COMMUNITY)
Admission: RE | Admit: 2019-07-16 | Discharge: 2019-07-16 | Disposition: A | Discharge status: Home / Self Care | Payer: BC Managed Care – PPO | Source: Ambulatory Visit | Attending: Ophthalmology | Admitting: Ophthalmology

## 2019-07-16 DIAGNOSIS — Z01812 Encounter for preprocedural laboratory examination: Secondary | ICD-10-CM | POA: Insufficient documentation

## 2019-07-16 DIAGNOSIS — Z20822 Contact with and (suspected) exposure to covid-19: Secondary | ICD-10-CM | POA: Insufficient documentation

## 2019-07-16 LAB — SARS CORONAVIRUS 2 (TAT 6-24 HRS): SARS Coronavirus 2: NEGATIVE

## 2019-07-16 NOTE — H&P (Signed)
Barbara Donovan is an 29 y.o. female.    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 Pt has done well and retina has reattached and stabilized under silicon oil. After a discussion of the risks, benefits and alternatives to surgery, the patient elected to proceed with surgery to remove the silicon oil-- 16X PPV w/ silicon oil removal, membrane peel, possible gas injection and intravitreal avastin, OS  Past Medical History:  Diagnosis Date  . Asthma 10/30/2013  . Cataract    NS OU  . Chronic hypertension during pregnancy, antepartum 08/19/2017  . Dehydration 01/28/2018  . Depression during pregnancy, antepartum 07/07/2017   6/20: Short trial of zoloft previously, reports didn't help much but also didn't give it a chance Discussed r/b/a SSRIs in pregnancy, agrees to try Zoloft again, rx sent No SI/HI/red flags  . Diabetes ([place])    TYPE I  . Diabetic retinopathy ([place]) 06/09/2017   07/2017 with bilateral severe diabetic non-proliferative retinopathy with macular edema.  Marland Kitchen HTN (hypertension)   . Hypertensive retinopathy    OU  . Hypokalemia 01/22/2018  . Hypomagnesemia 01/28/2018  . Intractable nausea and vomiting 01/22/2018  . Intrauterine growth restriction (IUGR) affecting care of mother 12/22/2017  . Morbid obesity ([place])   . 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 Proteinuria due to type 1 diabetes mellitus ([place]) 11/02/2017   Baseline Pr: Cr 10.23  . Severe hyperemesis gravidarum 10/30/2017  . Type I diabetes mellitus ([place]) 07/07/2017   Current Diabetic Medications:  Insulin  [x]  Aspirin 81 mg daily after 12 weeks (? A2/B GDM)  Required Referrals for A1GDM or A2GDM: [x]  Diabetes Education and Testing Supplies [x]  Nutrition Cousult  For A2/B GDM or higher classes of DM [x]  Diabetes Education and Testing Supplies [x]  Nutrition  Counsult [x]  Fetal ECHO after 20 weeks  [x]  Eye exam for retina evaluation - severe retinopathy 7/19  Base  . Ventricular septal defect (VSD) of fetus in singleton pregnancy, antepartum 09/30/2017   May go to newborn nursery per Dr. Lenard Simmer Echo prior to discharge    Past Surgical History:  Procedure Laterality Date  . [place] VITRECTOMY WITH 20 GAUGE MVR PORT FOR MACULAR HOLE Left 07/20/2018   Procedure: 25 GAUGE PARS PLANA VITRECTOMY LEFT EYE;  Surgeon: Bernarda Caffey, MD;  Location: [place];  Service: Ophthalmology;  Laterality: Left;  . CESAREAN SECTION N/A 12/26/2017   Procedure: CESAREAN SECTION;  Surgeon: Osborne Oman, MD;  Location: [place];  Service: Obstetrics;  Laterality: N/A;  . EYE EXAMINATION UNDER ANESTHESIA Right 07/20/2018   Procedure: Eye Exam Under Anesthesia RIGHT EYE;  Surgeon: Bernarda Caffey, MD;  Location: [place];  Service: Ophthalmology;  Laterality: Right;  . EYE SURGERY    . INJECTION OF SILICONE OIL Left 0/09/6043   Procedure: Injection Of Silicone Oil LEFT EYE;  Surgeon: Bernarda Caffey, MD;  Location: [facility];  Service: Ophthalmology;  Laterality: Left;  . LASER PHOTO ABLATION Right 07/20/2018   Procedure: Laser Photo Ablation RIGHT EYE;  Surgeon: Bernarda Caffey, MD;  Location: [facility];  Service: Ophthalmology;  Laterality: Right;  . MEMBRANE PEEL Left 07/20/2018   Procedure: Membrane Peel LEFT EYE;  Surgeon: Bernarda Caffey, MD;  Location: [facility];  Service: Ophthalmology;  Laterality: Left;  . MITOMYCIN C APPLICATION Bilateral 4/0/9811   Procedure: Avastin Application;  Surgeon:  Bernarda Caffey, MD;  Location: [facility];  Service: Ophthalmology;  Laterality: Bilateral;  . NO PAST SURGERIES    . PHOTOCOAGULATION WITH LASER Left 07/20/2018   Procedure: Photocoagulation With Laser LEFT EYE;  Surgeon: Bernarda Caffey, MD;  Location: [facility];  Service: Ophthalmology;  Laterality: Left;  . RETINAL DETACHMENT SURGERY Left 07/20/2018   Dr. Bernarda Caffey  . WISDOM TOOTH EXTRACTION       Family History  Problem Relation Age of Onset  . Diabetes Mother   . Aneurysm Mother 43  . Seizures Mother   . Diabetes Father   . Cataracts Father   . COPD Father   . Heart attack Father   . Heart disease Father   . Healthy Sister   . Healthy Daughter   . Stroke Maternal Grandfather   . Amblyopia Neg Hx   . Blindness Neg Hx   . Glaucoma Neg Hx   . Macular degeneration Neg Hx   . Retinal detachment Neg Hx   . Strabismus Neg Hx   . Retinitis pigmentosa Neg Hx    Social History:  reports that she has never smoked. She has never used smokeless tobacco. She reports previous alcohol use. She reports that she does not use drugs.  Allergies: No Known Allergies  No medications prior to admission.    Review of systems otherwise negative  unknown if currently breastfeeding.  Physical exam: Mental status: oriented x3. Eyes: See eye exam associated with this date of surgery Ears, Nose, Throat: within normal limits Neck: Within Normal limits General: within normal limits Chest: Within normal limits Breast: deferred Heart: Within normal limits Abdomen: Within normal limits GU: deferred Extremities: within normal limits Skin: within normal limits  Assessment/Plan 1. History of proliferative diabetic retinopathy with silicon oil in vitreous, OS 2. ERM OS  [facility] w/ silicon oil removal, membrane peel, and gas injection + intravitreal avastin, LEFT EYE, under general anesthesia. - case scheduled for Thursdsay, 7.1.21, 1130 am  Gardiner Sleeper, M.D., Ph.D. Vitreoretinal Surgeon [facility]

## 2019-07-17 ENCOUNTER — Telehealth: Payer: Self-pay

## 2019-07-17 NOTE — Telephone Encounter (Signed)
Left message requesting call back reference PREP class starting 7/20-evening T/Th 615p-730pm

## 2019-07-18 ENCOUNTER — Other Ambulatory Visit: Payer: Self-pay

## 2019-07-18 ENCOUNTER — Encounter (HOSPITAL_COMMUNITY): Payer: Self-pay | Admitting: Ophthalmology

## 2019-07-18 NOTE — Progress Notes (Signed)
[facility] Note  07/20/2019     CHIEF COMPLAINT Patient presents for Post-op Follow-up   HISTORY OF PRESENT ILLNESS: Barbara Donovan is a 29 y.o. female who presents to the clinic today for:   HPI    Post-op Follow-up    In left eye.  Discomfort includes tearing.  I, the attending physician,  performed the HPI with the patient and updated documentation appropriately.          Comments    1 day post op membrane peel, silicone oil removed OD- Denies pain just discomfort.  Had a good night.  Eye started tearing this morning.        Last edited by Bernarda Caffey, MD on 07/20/2019 11:46 AM. (History)    pt states last night "wasn't bad", she was able to sleep face down   Referring physician: Fanny Bien, MD [address] [place],  [place] [address]  HISTORICAL INFORMATION:   Selected notes from the MEDICAL RECORD NUMBER Referred by Dr. [name] for concern of bilateral CRVO    CURRENT MEDICATIONS: No current outpatient medications on file. (Ophthalmic Drugs)   No current facility-administered medications for this visit. (Ophthalmic Drugs)   Current Outpatient Medications (Other)  Medication Sig   Blood Pressure Monitoring (OMRON 3 SERIES BP MONITOR) DEVI 1 each by Does not apply route daily. Take daily blood pressure. Please report back to Dr. Radford Pax in 2 wks.   carvedilol (COREG) 25 MG tablet Take 1 tablet (25 mg total) by mouth 2 (two) times daily.   diltiazem (CARDIZEM CD) 240 MG 24 hr capsule Take 1 capsule (240 mg total) by mouth daily.   furosemide (LASIX) 20 MG tablet Take 2 tablets (40 mg total) by mouth daily as needed for edema. (Patient taking differently: Take 20 mg by mouth daily as needed for edema. )   hydrALAZINE (APRESOLINE) 50 MG tablet Take 2 tablets (100 mg total) by mouth in the morning AND 1 tablet (50 mg total) daily at 12 noon AND 2 tablets (100 mg total) every evening. (Patient taking differently: Take 50  mg by mouth three times daily)   insulin aspart (NOVOLOG) 100 UNIT/ML injection FOR USE IN PUMP, TOTAL OF 60 UNITS PER DAY.   Insulin Pen Needle (PEN NEEDLES) 32G X 5 MM MISC 1 Device by Does not apply route 4 (four) times daily.   norethindrone (MICRONOR) 0.35 MG tablet Take 1 tablet by mouth daily.   ONETOUCH VERIO test strip USE AS DIRECTED 5 TIMES DAILY   No current facility-administered medications for this visit. (Other)      REVIEW OF SYSTEMS: ROS    Positive for: Neurological, Genitourinary, Endocrine, Eyes, Respiratory   Negative for: Constitutional, Gastrointestinal, Skin, Musculoskeletal, HENT, Cardiovascular, Psychiatric, Allergic/Imm, Heme/Lymph   Last edited by Leonie Douglas, COA on 07/20/2019  8:49 AM. (History)       ALLERGIES No Known Allergies  PAST MEDICAL HISTORY Past Medical History:  Diagnosis Date   Asthma    as a child, no problems as an adult, no inhaler   Cataract    NS OU   Chronic hypertension during pregnancy, antepartum 08/19/2017   Dehydration 01/28/2018   Depression during pregnancy, antepartum 07/07/2017   6/20: Short trial of zoloft previously, reports didn't help much but also didn't give it a chance Discussed r/b/a SSRIs in pregnancy, agrees to try Zoloft again, rx sent No SI/HI/red flags   Diabetes ([place])  TYPE I   Diabetic retinopathy ([place]) 06/09/2017   07/2017 with bilateral severe diabetic non-proliferative retinopathy with macular edema.   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 A2/B GDM)  Required Referrals for A1GDM or A2GDM: [x]  Diabetes Education and Testing Supplies [x]  Nutrition Cousult  For A2/B GDM or higher classes of DM [x]  Diabetes Education and Testing Supplies [x]  Nutrition Counsult [x]  Fetal ECHO after 20 weeks  [x]  Eye exam for retina evaluation - severe retinopathy 7/19  Base   Ventricular septal defect (VSD) of fetus in singleton pregnancy, antepartum 09/30/2017   May go to newborn nursery per Dr. Lenard Simmer Echo prior to discharge   Past Surgical History:  Procedure Laterality Date   25 GAUGE PARS PLANA VITRECTOMY WITH 20 GAUGE MVR PORT FOR MACULAR HOLE Left 07/20/2018   Procedure: 25 GAUGE PARS PLANA VITRECTOMY LEFT EYE;  Surgeon: Bernarda Caffey, MD;  Location: [facility];  Service: Ophthalmology;  Laterality: Left;   CESAREAN SECTION N/A 12/26/2017   Procedure: CESAREAN SECTION;  Surgeon: Osborne Oman, MD;  Location: [facility];  Service: Obstetrics;  Laterality: N/A;   EYE EXAMINATION UNDER ANESTHESIA Right 07/20/2018   Procedure: Eye Exam Under Anesthesia RIGHT EYE;  Surgeon: Bernarda Caffey, MD;  Location: [facility];  Service: Ophthalmology;  Laterality: Right;   EYE SURGERY Left 07/2018   GAS INSERTION Left 07/19/2019   Procedure: INSERTION OF GAS;  Surgeon: Bernarda Caffey, MD;  Location: [facility];  Service: Ophthalmology;  Laterality: Left;  SF6   INJECTION OF SILICONE OIL Left 04/22/4096   Procedure: Injection Of Silicone Oil LEFT EYE;  Surgeon: Bernarda Caffey, MD;  Location: [place];  Service: Ophthalmology;  Laterality: Left;   LASER PHOTO ABLATION Right 07/20/2018   Procedure: Laser Photo Ablation RIGHT EYE;  Surgeon: Bernarda Caffey, MD;  Location: [facility];  Service: Ophthalmology;  Laterality: Right;   MEMBRANE PEEL Left 07/20/2018   Procedure: Membrane Peel LEFT EYE;  Surgeon: Bernarda Caffey, MD;  Location: [facility];  Service: Ophthalmology;  Laterality: Left;   MEMBRANE PEEL Left 07/19/2019    Procedure: MEMBRANE PEEL;  Surgeon: Bernarda Caffey, MD;  Location: [facility];  Service: Ophthalmology;  Laterality: Left;   MITOMYCIN C APPLICATION Bilateral 01/18/9145   Procedure: Avastin Application;  Surgeon: Bernarda Caffey, MD;  Location: [place];  Service: Ophthalmology;  Laterality: Bilateral;   PHOTOCOAGULATION WITH LASER Left 07/20/2018   Procedure: Photocoagulation With Laser LEFT EYE;  Surgeon: Bernarda Caffey, MD;  Location: [place];  Service: Ophthalmology;  Laterality: Left;   PHOTOCOAGULATION WITH LASER Left 07/19/2019   Procedure: PHOTOCOAGULATION WITH LASER;  Surgeon: Bernarda Caffey, MD;  Location: [facility];  Service: Ophthalmology;  Laterality: Left;   RETINAL DETACHMENT SURGERY Left 07/20/2018   Dr. Bernarda Caffey   SILICON OIL REMOVAL Left 07/19/2019   Procedure: 25g PARS PLANA VITRECTOMY WITH SILICON OIL REMOVAL;  Surgeon: Bernarda Caffey, MD;  Location: [facility];  Service: Ophthalmology;  Laterality: Left;   WISDOM TOOTH EXTRACTION  FAMILY HISTORY Family History  Problem Relation Age of Onset   Diabetes Mother    Aneurysm Mother 68   Seizures Mother    Diabetes Father    Cataracts Father    COPD Father    Heart attack Father    Heart disease Father    Healthy Sister    Healthy Daughter    Stroke Maternal Grandfather    Amblyopia Neg Hx    Blindness Neg Hx    Glaucoma Neg Hx    Macular degeneration Neg Hx    Retinal detachment Neg Hx    Strabismus Neg Hx    Retinitis pigmentosa Neg Hx     SOCIAL HISTORY Social History   Tobacco Use   Smoking status: Never Smoker   Smokeless tobacco: Never Used  Vaping Use   Vaping Use: Never used  Substance Use Topics   Alcohol use: Not Currently    Comment: SOCIALLY   Drug use: Never         OPHTHALMIC EXAM:  Base Eye Exam    Visual Acuity (Snellen - Linear)      Right Left   Dist [place] def HM       Tonometry (Tonopen, 8:56 AM)      Right Left   Pressure def 16       Pupils      Dark   Right     Left dilated       Neuro/Psych    Oriented x3: Yes   Mood/Affect: Normal       Dilation    Left eye: 1.0% Mydriacyl, 2.5% Phenylephrine @ 8:57 AM        Slit Lamp and Fundus Exam    Slit Lamp Exam      Right Left   Lids/Lashes Normal Normal   Conjunctiva/Sclera White and quiet Mild Subconjunctival hemorrhage, sutures intact   Cornea trace Punctate epithelial erosions Well healed cataract wounds, trade PEE   Anterior Chamber Deep and quiet Deep and quiet   Iris Round, moderate dilation, No NVI Round and dilated to 6.30mm, No NVI   Lens trace Nuclear sclerosis and cortical changes PCIOL in good position; 1-2+ PCO   Vitreous VH cleared centrally, Blood stained vitreous condensations and clots settled inferiorly - now white in color post vitrectomy, good gas fill       Fundus Exam      Right Left   Disc  Pink and sharp   C/D Ratio 0.3 0.3   Macula Flat, good foveal reflex, scattered MA/IRH/exudate - improved, +ERM flat under gas, improvement in fibrosis and ERM   Vessels Vascular attenuation, Tortuous, regressing fibrotic NV Attenuated, perivascular fibrosis IT arcades improved, Tortuous   Periphery Attached, scattered IRH. 360 PRP attached under gas; focal patches of SO emulsification; peripapillary fibrosis regressing under oil, good posterior PRP laser changes          IMAGING AND PROCEDURES  Imaging and Procedures for @TODAY @           ASSESSMENT/PLAN:    ICD-10-CM   1. Type 1 diabetes mellitus with proliferative retinopathy of both eyes and macular edema ([place])  E10.3513   2. Traction detachment of left retina  H33.42   3. Vitreous hemorrhage of both eyes ([place])  H43.13   4. Epiretinal membrane (ERM) of left eye  H35.372   5. Retinal edema  H35.81   6. Essential hypertension  I10   7. Hypertensive retinopathy of both eyes  H35.033   8. Combined  forms of age-related cataract of right eye  H25.811   9. Pseudophakia  Z96.1     1-3. Type 1 DM with  Proliferative diabetic retinopathy OU (OS > OD)  - s/p PRP OS (11.26.19)  - s/p PRP OD (06.01.20)  - lost to f/u following 11.26.19 appt due to complicated pregnancy, premature birth of baby w/ extended NICU stay and congenital heart defect, and post-partum health issues OS  - history of vision loss OS onset December 2019  - OS with chronic VH and TRD, likely present since Dec 2019  - s/p IVA OS #1 (06.23.20), #2 (07.02.20), #3 (04.02.21)  - s/p IVA OD #2 (11.30.20)  - s/p PPV/MP/EL/silicon oil + IVA OS for VH and TRD from DM1, 07.02.20             - doing well -- had cataract surgery OS with Dr. Eulas Post, 12.15.20 -- looks good, mild PCO             - retina attached under silicon oil -- persistent nasal ERM w/ edema OS  - mild SO emulsification OS  - now s/p PPV/SOR removal/Tissue Blue stain/MP/20% SF6 gas, IVA OS, 07.01.21             - doing well this morning             - IOP 16              - start   PF 4x/day OD                          zymaxid QID OS                         Atropine BID OS                          Brimonidine QD OS                          PSO ung QID OS              - cont face down positioning x3 days; avoid laying flat on back              - eye shield when sleeping              - post op drop and positioning instructions reviewed              - tylenol/ibuprofen for pain              - Rx given for breakthrough pain  - f/u 1 week, POV OD  - s/p fill in PRP and IVA OD 07.02.2020 in operating room  - new onset diffuse VH OD since ~11.26.20   - s/p IVA OD #2 (11.30.20) -- VH stably improved today  - BCVA 20/20   4,5. Epiretinal membrane, left eye   - focal ERM with retinal thickening nasal macula  - s/p TissueBlue assisted membrane peel OS as above (7.1.21)   6,7. Hypertensive retinopathy OU  - there was concern for pregnancy related hypertension (I.e. Pre-eclampsia, Eclampsia, HELLP, etc) during pregnancy  - also during pregnancy (7.17.2019), had  bilateral CRVO w/ CME (OD > OS) s/p PRP OS  - discussed importance of tight BP control  - monitor  8. Mixed cataract OD  - The symptoms  of cataract, surgical options, and treatments and risks were discussed with patient.  - not visually significant  9. Pseudophakia OS  - s/p CE/IOL OS w/ Dr. Eulas Post, 12.15.2020  - beautiful surgery, doing well  - mild PCO  - monitor -- discussed possibility of YAG laser in office or capsulotomy at the time of oil removal OS    Ophthalmic Meds Ordered this visit:  No orders of the defined types were placed in this encounter.     Return in about 1 week (around 07/27/2019) for f/u POV.  There are no Patient Instructions on file for this visit.   Explained the diagnoses, plan, and follow up with the patient and they expressed understanding.  Patient expressed understanding of the importance of proper follow up care.  This document serves as a record of services personally performed by Gardiner Sleeper, MD, PhD. It was created on their behalf by San Jetty. Owens Shark, COT, an ophthalmic technician. The creation of this record is the provider's dictation and/or activities during the visit.    Electronically signed by: San Jetty. Owens Shark, [place] 06.30.2021 11:30 PM  Gardiner Sleeper, M.D., Ph.D. Diseases & Surgery of the Retina and Vitreous [facility]  I have reviewed the above documentation for accuracy and completeness, and I agree with the above. Gardiner Sleeper, M.D., Ph.D. 07/23/19 11:30 PM   Abbreviations: M myopia (nearsighted); A astigmatism; H hyperopia (farsighted); P presbyopia; Mrx spectacle prescription;  CTL contact lenses; OD right eye; OS left eye; OU both eyes  XT exotropia; ET esotropia; PEK punctate epithelial keratitis; PEE punctate epithelial erosions; DES dry eye syndrome; MGD meibomian gland dysfunction; ATs artificial tears; PFAT's preservative free artificial tears; [place] nuclear sclerotic cataract; PSC posterior  subcapsular cataract; ERM epi-retinal membrane; PVD posterior vitreous detachment; RD retinal detachment; DM diabetes mellitus; DR diabetic retinopathy; NPDR non-proliferative diabetic retinopathy; PDR proliferative diabetic retinopathy; CSME clinically significant macular edema; DME diabetic macular edema; dbh dot blot hemorrhages; CWS cotton wool spot; POAG primary open angle glaucoma; C/D cup-to-disc ratio; HVF humphrey visual field; GVF goldmann visual field; OCT optical coherence tomography; IOP intraocular pressure; BRVO Branch retinal vein occlusion; CRVO central retinal vein occlusion; CRAO central retinal artery occlusion; BRAO branch retinal artery occlusion; RT retinal tear; SB scleral buckle; PPV pars plana vitrectomy; VH Vitreous hemorrhage; PRP panretinal laser photocoagulation; IVK intravitreal kenalog; VMT vitreomacular traction; MH Macular hole;  NVD neovascularization of the disc; NVE neovascularization elsewhere; AREDS age related eye disease study; ARMD age related macular degeneration; POAG primary open angle glaucoma; EBMD epithelial/anterior basement membrane dystrophy; ACIOL anterior chamber intraocular lens; IOL intraocular lens; PCIOL posterior chamber intraocular lens; Phaco/IOL phacoemulsification with intraocular lens placement; [place] photorefractive keratectomy; LASIK laser assisted in situ keratomileusis; HTN hypertension; DM diabetes mellitus; COPD chronic obstructive pulmonary disease

## 2019-07-18 NOTE — Progress Notes (Signed)
Patient denies shortness of breath, fever, cough or chest pain.  PCP - Dr Rachell Cipro Cardiologist - Dr Fransico Him  Internal Med - Dr Renato Shin Dr Skeet Latch treats kidney disease  Chest x-ray - n/a EKG - 03/07/19 Stress Test - n/a ECHO - 12/23/17 Cardiac Cath - n/a  Fasting Blood Sugar - 60-70s Checks Blood Sugar _4_ times a day  . THE NIGHT BEFORE SURGERY, Reduce all basal rates by 20% starting at midnight. (Insulin pump, Novolog).  Spoke with Ebony Hail, [place] and Bethena Roys Diabetes Coordinator confirming basal rate reduction 20% starting at midnight.   . If your blood sugar is less than 70 mg/dL, you will need to treat for low blood sugar: o Treat a low blood sugar (less than 70 mg/dL) with  cup of clear juice (cranberry or apple), 4 glucose tablets, OR glucose gel. o Recheck blood sugar in 15 minutes after treatment (to make sure it is greater than 70 mg/dL). If your blood sugar is not greater than 70 mg/dL on recheck, call [phone] for further instructions.   ERAS: Clears til 0830 DOS.  Anesthesia review: Yes  STOP now taking any Aspirin (unless otherwise instructed by your surgeon), Aleve, Naproxen, Ibuprofen, Motrin, Advil, Goody's, BC's, all herbal medications, fish oil, and all vitamins.   Coronavirus Screening Covid test on 07/16/19 was negative.  Patient verbalized understanding of instructions that were given via phone.

## 2019-07-18 NOTE — Anesthesia Preprocedure Evaluation (Addendum)
Anesthesia Evaluation  Patient identified by MRN, date of birth, ID band Patient awake    Reviewed: Allergy & Precautions, H&P , NPO status , Patient's Chart, lab work & pertinent test results, reviewed documented beta blocker date and time   Airway Mallampati: II  TM Distance: >3 FB Neck ROM: Full    Dental no notable dental hx. (+) Dental Advisory Given, Teeth Intact   Pulmonary asthma ,    Pulmonary exam normal breath sounds clear to auscultation       Cardiovascular hypertension, Pt. on medications and Pt. on home beta blockers Normal cardiovascular exam Rhythm:Regular Rate:Normal     Neuro/Psych PSYCHIATRIC DISORDERS Depression negative neurological ROS     GI/Hepatic negative GI ROS, Neg liver ROS,   Endo/Other  diabetes, Insulin Dependent  Renal/GU Renal InsufficiencyRenal disease     Musculoskeletal   Abdominal (+) + obese,   Peds  Hematology negative hematology ROS (+)   Anesthesia Other Findings   Reproductive/Obstetrics negative OB ROS                         Anesthesia Physical Anesthesia Plan  ASA: III  Anesthesia Plan: General   Post-op Pain Management:    Induction: Intravenous  PONV Risk Score and Plan: 4 or greater and Ondansetron, Midazolam, Treatment may vary due to age or medical condition and Dexamethasone  Airway Management Planned: Oral ETT  Additional Equipment: None  Intra-op Plan:   Post-operative Plan: Extubation in OR  Informed Consent: I have reviewed the patients History and Physical, chart, labs and discussed the procedure including the risks, benefits and alternatives for the proposed anesthesia with the patient or authorized representative who has indicated his/her understanding and acceptance.     Dental advisory given  Plan Discussed with: CRNA  Anesthesia Plan Comments: (PAT note written 07/18/2019 by Myra Gianotti, PA-C. SAME DAY  WORK-UP DM1 with insulin pump.)    Anesthesia Quick Evaluation

## 2019-07-18 NOTE — Progress Notes (Signed)
Anesthesia Chart Review: SAME DAY WORK-UP   Case: [id number] Date/Time: 07/19/19 1115   Procedures:      SILICON OIL REMOVAL (Left )     MEMBRANE PEEL (Left )   Anesthesia type: General   Pre-op diagnosis: Epiretinal membrane, left eye   Location: [facility] OR ROOM 08 / [place] OR   Surgeons: Bernarda Caffey, MD      DISCUSSION: Patient is a 29 year old female scheduled for the above procedure.  History includes never smoker, DM1 (with retinopathy, nephropathy), HTN, asthma, C-section 12/27/17 (infant born with VSD, requiring NICU stay and surgery), obesity.  Patient is a DM type 1. She has an insulin pump (2.4 units/hr 7AM-9PM and 0.8 units/hr overnight. She is still having some low morning CBGs (60's) that she is treating with juice and reports that Dr. Loanne Drilling is aware.  , but this was on hold for ~ 2 weeks while she was moving across town. She was supplementing with her Novolog pen. She reports that she is getting back on her insulin pump by 07/18/18. Her last A1c on 02/21/19 was elevated at 11.3% (range of 9.7-12.0% over the past year). (A1c routed to Dr. Coralyn Pear.)  She had a negative presurgical COVID test on 07/16/19. Anesthesia team to evaluate on the day of surgery.   VS: Ht 5\' 6"  (1.676 m)   Wt 89.4 kg   LMP 07/16/2019 (Exact Date)   BMI 31.80 kg/m   BP Readings from Last 3 Encounters:  06/21/19 (!) 154/90  04/19/19 (!) 144/68  03/22/19 (!) 142/82    PROVIDERS: Fanny Bien, MD is PCP  - Renato Shin, MD is endocrinologist. Last visit 02/21/19.  Fransico Him, MD is primary cardiologist. Last visit with Skeet Latch, MD on 03/22/19 at the [facility]. She wrote, "We do not want to increase her irbesartan due to her chronic kidney disease.  Continue carvedilol, diltiazem, irbesartan, and Lasix.  It is okay for her to take 40 mg of Lasix as needed to see if this helps with her swelling.  However I am suspicious this may be due to venous insufficiency." No renal artery  stenosis by 03/22/19 [name].   LABS: A1c 11.3 on 02/21/19. Cr 1.47 on 04/05/19 (previously 1.08-1.42 since 12/2018). H/H 11.9/36.7 on 07/17/18.    EKG: 03/07/19: NSR   CV: Renal Artery [name] 03/22/19: Summary:  Largest Aortic Diameter: 2.1 cm  Renal:  Right: Normal size right kidney. Abnormal right Resistive Index.     Normal cortical thickness of right kidney. No evidence of     right renal artery stenosis. RRV flow present.  Left: Normal size of left kidney. Abnormal left Resisitve Index.     Normal cortical thickness of the left kidney. No evidence of     left renal artery stenosis. LRV flow present.  Mesenteric:  Normal Celiac artery and Superior Mesenteric artery findings.    Echo 12/23/17: Study Conclusions - Left ventricle: The cavity size was normal. Systolic function was normal. The estimated ejection fraction was in the range of 60% to 65%. Wall motion was normal; there were no regional wall motion abnormalities. Left ventricular diastolic function parameters were normal. - Atrial septum: No defect or patent foramen ovale was identified. - Pericardium, extracardiac: A trivial pericardial effusion was identified.   Past Medical History:  Diagnosis Date  . Asthma    as a child, no problems as an adult, no inhaler  . Cataract    NS OU  . Chronic hypertension during  pregnancy, antepartum 08/19/2017  . Dehydration 01/28/2018  . Depression during pregnancy, antepartum 07/07/2017   6/20: Short trial of zoloft previously, reports didn't help much but also didn't give it a chance Discussed r/b/a SSRIs in pregnancy, agrees to try Zoloft again, rx sent No SI/HI/red flags  . Diabetes ([place])    TYPE I  . 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  Marland Kitchen HTN (hypertension)   . Hypertensive retinopathy    OU  . Hypokalemia 01/22/2018  . Hypomagnesemia 01/28/2018  . Intractable nausea  and vomiting 01/22/2018  . Intrauterine growth restriction (IUGR) affecting care of mother 12/22/2017  . Morbid obesity ([place])   . Nephropathy, diabetic ([place]) 12/29/2017  . 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 Severe hyperemesis gravidarum 10/30/2017  . Type I diabetes mellitus ([place]) 07/07/2017   Current Diabetic Medications:  Insulin  [x]  Aspirin 81 mg daily after 12 weeks (? A2/B GDM)  Required Referrals for A1GDM or A2GDM: [x]  Diabetes Education and Testing Supplies [x]  Nutrition Cousult  For A2/B GDM or higher classes of DM [x]  Diabetes Education and Testing Supplies [x]  Nutrition Counsult [x]  Fetal ECHO after 20 weeks  [x]  Eye exam for retina evaluation - severe retinopathy 7/19  Base  . Ventricular septal defect (VSD) of fetus in singleton pregnancy, antepartum 09/30/2017   May go to newborn nursery per Dr. Lenard Simmer Echo prior to discharge    Past Surgical History:  Procedure Laterality Date  . [place] VITRECTOMY WITH 20 GAUGE MVR PORT FOR MACULAR HOLE Left 07/20/2018   Procedure: 25 GAUGE PARS PLANA VITRECTOMY LEFT EYE;  Surgeon: Bernarda Caffey, MD;  Location: [place];  Service: Ophthalmology;  Laterality: Left;  . CESAREAN SECTION N/A 12/26/2017   Procedure: CESAREAN SECTION;  Surgeon: Osborne Oman, MD;  Location: [place];  Service: Obstetrics;  Laterality: N/A;  . EYE EXAMINATION UNDER ANESTHESIA Right 07/20/2018   Procedure: Eye Exam Under Anesthesia RIGHT EYE;  Surgeon: Bernarda Caffey, MD;  Location: [place];  Service: Ophthalmology;  Laterality: Right;  . EYE SURGERY Left 07/2018  . INJECTION OF SILICONE OIL Left 01/23/1094   Procedure: Injection Of Silicone Oil LEFT EYE;  Surgeon: Bernarda Caffey, MD;  Location: [place];  Service: Ophthalmology;  Laterality: Left;  . LASER PHOTO ABLATION Right 07/20/2018   Procedure: Laser Photo Ablation RIGHT EYE;  Surgeon: Bernarda Caffey, MD;  Location: [place];  Service: Ophthalmology;   Laterality: Right;  . MEMBRANE PEEL Left 07/20/2018   Procedure: Membrane Peel LEFT EYE;  Surgeon: Bernarda Caffey, MD;  Location: [place];  Service: Ophthalmology;  Laterality: Left;  . MITOMYCIN C APPLICATION Bilateral 0/04/5407   Procedure: Avastin Application;  Surgeon: Bernarda Caffey, MD;  Location: [place];  Service: Ophthalmology;  Laterality: Bilateral;  . PHOTOCOAGULATION WITH LASER Left 07/20/2018   Procedure: Photocoagulation With Laser LEFT EYE;  Surgeon: Bernarda Caffey, MD;  Location: [place];  Service: Ophthalmology;  Laterality: Left;  . RETINAL DETACHMENT SURGERY Left 07/20/2018   Dr. Bernarda Caffey  . WISDOM TOOTH EXTRACTION      MEDICATIONS: No current facility-administered medications for this encounter.   . carvedilol (COREG) 25 MG tablet  . diltiazem (CARDIZEM CD) 240 MG 24 hr capsule  . furosemide (LASIX) 20 MG tablet  . hydrALAZINE (APRESOLINE) 50 MG tablet  . norethindrone (MICRONOR) 0.35 MG tablet  . Blood Pressure Monitoring (OMRON 3 SERIES  BP MONITOR) DEVI  . insulin aspart (NOVOLOG) 100 UNIT/ML injection  . Insulin Pen Needle (PEN NEEDLES) 32G X 5 MM MISC  . ONETOUCH VERIO test strip    Myra Gianotti, PA-C Surgical Short Stay/Anesthesiology [facility] Phone [phone] [facility] Phone [phone] 07/18/2019 12:25 PM

## 2019-07-19 ENCOUNTER — Ambulatory Visit (HOSPITAL_COMMUNITY): Payer: BC Managed Care – PPO | Admitting: Vascular Surgery

## 2019-07-19 ENCOUNTER — Encounter (HOSPITAL_COMMUNITY)
Admission: RE | Disposition: A | Discharge status: Home / Self Care | Payer: Self-pay | Source: Home / Self Care | Attending: Ophthalmology

## 2019-07-19 ENCOUNTER — Ambulatory Visit (HOSPITAL_COMMUNITY)
Admission: RE | Admit: 2019-07-19 | Discharge: 2019-07-19 | Disposition: A | Discharge status: Home / Self Care | Payer: BC Managed Care – PPO | Attending: Ophthalmology | Admitting: Ophthalmology

## 2019-07-19 ENCOUNTER — Encounter (HOSPITAL_COMMUNITY): Payer: Self-pay | Admitting: Ophthalmology

## 2019-07-19 ENCOUNTER — Other Ambulatory Visit: Payer: Self-pay

## 2019-07-19 DIAGNOSIS — N189 Chronic kidney disease, unspecified: Secondary | ICD-10-CM | POA: Diagnosis not present

## 2019-07-19 DIAGNOSIS — H20052 Hypopyon, left eye: Secondary | ICD-10-CM | POA: Diagnosis present

## 2019-07-19 DIAGNOSIS — H35039 Hypertensive retinopathy, unspecified eye: Secondary | ICD-10-CM | POA: Diagnosis not present

## 2019-07-19 DIAGNOSIS — Z9641 Presence of insulin pump (external) (internal): Secondary | ICD-10-CM | POA: Diagnosis not present

## 2019-07-19 DIAGNOSIS — I129 Hypertensive chronic kidney disease with stage 1 through stage 4 chronic kidney disease, or unspecified chronic kidney disease: Secondary | ICD-10-CM | POA: Insufficient documentation

## 2019-07-19 DIAGNOSIS — E1022 Type 1 diabetes mellitus with diabetic chronic kidney disease: Secondary | ICD-10-CM | POA: Diagnosis not present

## 2019-07-19 DIAGNOSIS — Z6831 Body mass index (BMI) 31.0-31.9, adult: Secondary | ICD-10-CM | POA: Insufficient documentation

## 2019-07-19 DIAGNOSIS — E1039 Type 1 diabetes mellitus with other diabetic ophthalmic complication: Secondary | ICD-10-CM | POA: Insufficient documentation

## 2019-07-19 DIAGNOSIS — H4302 Vitreous prolapse, left eye: Secondary | ICD-10-CM

## 2019-07-19 DIAGNOSIS — E103592 Type 1 diabetes mellitus with proliferative diabetic retinopathy without macular edema, left eye: Secondary | ICD-10-CM | POA: Diagnosis not present

## 2019-07-19 DIAGNOSIS — I10 Essential (primary) hypertension: Secondary | ICD-10-CM | POA: Insufficient documentation

## 2019-07-19 DIAGNOSIS — H35372 Puckering of macula, left eye: Secondary | ICD-10-CM

## 2019-07-19 HISTORY — PX: PHOTOCOAGULATION WITH LASER: SHX6027

## 2019-07-19 HISTORY — PX: SILICON OIL REMOVAL: SHX5305

## 2019-07-19 HISTORY — PX: GAS INSERTION: SHX5336

## 2019-07-19 HISTORY — PX: MEMBRANE PEEL: SHX5967

## 2019-07-19 LAB — CBC
HCT: 35.6 % — ABNORMAL LOW (ref 36.0–46.0)
Hemoglobin: 10.8 g/dL — ABNORMAL LOW (ref 12.0–15.0)
MCH: 26.3 pg (ref 26.0–34.0)
MCHC: 30.3 g/dL (ref 30.0–36.0)
MCV: 86.6 fL (ref 80.0–100.0)
Platelets: 373 10*3/uL (ref 150–400)
RBC: 4.11 MIL/uL (ref 3.87–5.11)
RDW: 13.2 % (ref 11.5–15.5)
WBC: 7.2 10*3/uL (ref 4.0–10.5)
nRBC: 0 % (ref 0.0–0.2)

## 2019-07-19 LAB — BASIC METABOLIC PANEL
Anion gap: 9 (ref 5–15)
BUN: 27 mg/dL — ABNORMAL HIGH (ref 6–20)
CO2: 18 mmol/L — ABNORMAL LOW (ref 22–32)
Calcium: 8 mg/dL — ABNORMAL LOW (ref 8.9–10.3)
Chloride: 109 mmol/L (ref 98–111)
Creatinine, Ser: 2.14 mg/dL — ABNORMAL HIGH (ref 0.44–1.00)
GFR calc Af Amer: 35 mL/min — ABNORMAL LOW (ref >60)
GFR calc non Af Amer: 31 mL/min — ABNORMAL LOW (ref >60)
Glucose, Bld: 168 mg/dL — ABNORMAL HIGH (ref 70–99)
Potassium: 4 mmol/L (ref 3.5–5.1)
Sodium: 136 mmol/L (ref 135–145)

## 2019-07-19 LAB — GLUCOSE, CAPILLARY
Glucose-Capillary: 165 mg/dL — ABNORMAL HIGH (ref 70–99)
Glucose-Capillary: 118 mg/dL — ABNORMAL HIGH (ref 70–99)
Glucose-Capillary: 53 mg/dL — ABNORMAL LOW (ref 70–99)
Glucose-Capillary: 97 mg/dL (ref 70–99)

## 2019-07-19 LAB — POCT PREGNANCY, URINE: Preg Test, Ur: NEGATIVE

## 2019-07-19 SURGERY — REMOVAL, SILICONE OIL, EYE
Anesthesia: General | Site: Eye | Laterality: Left

## 2019-07-19 MED ORDER — TROPICAMIDE 1 % OP SOLN
1.0000 [drp] | OPHTHALMIC | Status: AC | PRN
  Administered 2019-07-19 (×3): 1 [drp] via OPHTHALMIC
  Filled 2019-07-19: qty 15

## 2019-07-19 MED ORDER — LIDOCAINE 2% (20 MG/ML) 5 ML SYRINGE
INTRAMUSCULAR | Status: AC
  Filled 2019-07-19: qty 5

## 2019-07-19 MED ORDER — BACITRACIN-POLYMYXIN B 500-10000 UNIT/GM OP OINT
TOPICAL_OINTMENT | OPHTHALMIC | Status: DC | PRN
  Administered 2019-07-19: 1 via OPHTHALMIC

## 2019-07-19 MED ORDER — DORZOLAMIDE HCL-TIMOLOL MAL 2-0.5 % OP SOLN
OPHTHALMIC | Status: AC
  Filled 2019-07-19: qty 10

## 2019-07-19 MED ORDER — PROPOFOL 10 MG/ML IV BOLUS
INTRAVENOUS | Status: AC
  Filled 2019-07-19: qty 20

## 2019-07-19 MED ORDER — CHLORHEXIDINE GLUCONATE 0.12 % MT SOLN
OROMUCOSAL | Status: AC
  Administered 2019-07-19: 15 mL via OROMUCOSAL
  Filled 2019-07-19: qty 15

## 2019-07-19 MED ORDER — ONDANSETRON HCL 4 MG/2ML IJ SOLN
INTRAMUSCULAR | Status: DC | PRN
  Administered 2019-07-19: 4 mg via INTRAVENOUS

## 2019-07-19 MED ORDER — CHLORHEXIDINE GLUCONATE 0.12 % MT SOLN: 15.0000 mL | Freq: Once | OROMUCOSAL | Status: AC

## 2019-07-19 MED ORDER — PREDNISOLONE ACETATE 1 % OP SUSP
OPHTHALMIC | Status: AC
  Filled 2019-07-19: qty 5

## 2019-07-19 MED ORDER — CEFTAZIDIME 1 G IJ SOLR
INTRAMUSCULAR | Status: AC
  Filled 2019-07-19: qty 1

## 2019-07-19 MED ORDER — ATROPINE SULFATE 1 % OP SOLN
1.0000 [drp] | OPHTHALMIC | Status: AC | PRN
  Administered 2019-07-19 (×3): 1 [drp] via OPHTHALMIC
  Filled 2019-07-19: qty 5

## 2019-07-19 MED ORDER — BSS IO SOLN
INTRAOCULAR | Status: DC | PRN
  Administered 2019-07-19: 15 mL via INTRAOCULAR

## 2019-07-19 MED ORDER — BRIMONIDINE TARTRATE 0.2 % OP SOLN
OPHTHALMIC | Status: AC
  Filled 2019-07-19: qty 5

## 2019-07-19 MED ORDER — NA CHONDROIT SULF-NA HYALURON 40-30 MG/ML IO SOLN
INTRAOCULAR | Status: DC | PRN
  Administered 2019-07-19: 0.5 mL via INTRAOCULAR

## 2019-07-19 MED ORDER — BSS IO SOLN
INTRAOCULAR | Status: AC
  Filled 2019-07-19: qty 15

## 2019-07-19 MED ORDER — ACETAMINOPHEN 500 MG PO TABS: 1000.0000 mg | ORAL_TABLET | Freq: Once | ORAL | Status: AC

## 2019-07-19 MED ORDER — PHENYLEPHRINE 40 MCG/ML (10ML) SYRINGE FOR IV PUSH (FOR BLOOD PRESSURE SUPPORT)
PREFILLED_SYRINGE | INTRAVENOUS | Status: DC | PRN
  Administered 2019-07-19 (×4): 40 ug via INTRAVENOUS

## 2019-07-19 MED ORDER — HYDROMORPHONE HCL 1 MG/ML IJ SOLN: 0.2500 mg | INTRAMUSCULAR | Status: DC | PRN

## 2019-07-19 MED ORDER — BRIMONIDINE TARTRATE 0.2 % OP SOLN
OPHTHALMIC | Status: DC | PRN
  Administered 2019-07-19: 1 [drp] via OPHTHALMIC

## 2019-07-19 MED ORDER — ROCURONIUM BROMIDE 10 MG/ML (PF) SYRINGE
PREFILLED_SYRINGE | INTRAVENOUS | Status: AC
  Filled 2019-07-19: qty 10

## 2019-07-19 MED ORDER — FENTANYL CITRATE (PF) 250 MCG/5ML IJ SOLN
INTRAMUSCULAR | Status: DC | PRN
  Administered 2019-07-19 (×2): 50 ug via INTRAVENOUS
  Administered 2019-07-19: 100 ug via INTRAVENOUS

## 2019-07-19 MED ORDER — STERILE WATER FOR INJECTION IJ SOLN
INTRAMUSCULAR | Status: DC | PRN
  Administered 2019-07-19: 20 mL

## 2019-07-19 MED ORDER — AMISULPRIDE (ANTIEMETIC) 5 MG/2ML IV SOLN
INTRAVENOUS | Status: AC
  Administered 2019-07-19: 10 mg
  Filled 2019-07-19: qty 4

## 2019-07-19 MED ORDER — LACTATED RINGERS IV SOLN: INTRAVENOUS | Status: DC | PRN

## 2019-07-19 MED ORDER — BSS PLUS IO SOLN
INTRAOCULAR | Status: AC
  Filled 2019-07-19: qty 500

## 2019-07-19 MED ORDER — AMISULPRIDE (ANTIEMETIC) 5 MG/2ML IV SOLN: 10.0000 mg | Freq: Once | INTRAVENOUS | Status: DC

## 2019-07-19 MED ORDER — SODIUM CHLORIDE (PF) 0.9 % IJ SOLN
INTRAMUSCULAR | Status: AC
  Filled 2019-07-19: qty 10

## 2019-07-19 MED ORDER — BRILLIANT BLUE G 0.025 % IO SOSY
PREFILLED_SYRINGE | INTRAOCULAR | Status: DC | PRN
  Administered 2019-07-19: 0.5 mL via INTRAVITREAL

## 2019-07-19 MED ORDER — MIDAZOLAM HCL 2 MG/2ML IJ SOLN
INTRAMUSCULAR | Status: AC
  Filled 2019-07-19: qty 2

## 2019-07-19 MED ORDER — PHENYLEPHRINE 40 MCG/ML (10ML) SYRINGE FOR IV PUSH (FOR BLOOD PRESSURE SUPPORT)
PREFILLED_SYRINGE | INTRAVENOUS | Status: AC
  Filled 2019-07-19: qty 10

## 2019-07-19 MED ORDER — DEXAMETHASONE SODIUM PHOSPHATE 10 MG/ML IJ SOLN
INTRAMUSCULAR | Status: AC
  Filled 2019-07-19: qty 1

## 2019-07-19 MED ORDER — BSS PLUS IO SOLN
INTRAOCULAR | Status: DC | PRN
  Administered 2019-07-19: 1 via INTRAOCULAR

## 2019-07-19 MED ORDER — STERILE WATER FOR INJECTION IJ SOLN
INTRAMUSCULAR | Status: DC | PRN
  Administered 2019-07-19: 400 mL

## 2019-07-19 MED ORDER — NA CHONDROIT SULF-NA HYALURON 40-30 MG/ML IO SOLN
INTRAOCULAR | Status: AC
  Filled 2019-07-19: qty 1

## 2019-07-19 MED ORDER — PROPOFOL 10 MG/ML IV BOLUS
INTRAVENOUS | Status: DC | PRN
  Administered 2019-07-19: 150 mg via INTRAVENOUS

## 2019-07-19 MED ORDER — BACITRACIN-POLYMYXIN B 500-10000 UNIT/GM OP OINT
TOPICAL_OINTMENT | OPHTHALMIC | Status: AC
  Filled 2019-07-19: qty 3.5

## 2019-07-19 MED ORDER — POLYMYXIN B SULFATE 500000 UNITS IJ SOLR
INTRAMUSCULAR | Status: AC
  Filled 2019-07-19: qty 500000

## 2019-07-19 MED ORDER — PROMETHAZINE HCL 25 MG/ML IJ SOLN: 6.2500 mg | INTRAMUSCULAR | Status: DC | PRN

## 2019-07-19 MED ORDER — DEXAMETHASONE SODIUM PHOSPHATE 10 MG/ML IJ SOLN
INTRAMUSCULAR | Status: DC | PRN
  Administered 2019-07-19: 4 mg via INTRAVENOUS

## 2019-07-19 MED ORDER — ROCURONIUM BROMIDE 10 MG/ML (PF) SYRINGE
PREFILLED_SYRINGE | INTRAVENOUS | Status: DC | PRN
  Administered 2019-07-19: 50 mg via INTRAVENOUS

## 2019-07-19 MED ORDER — ONDANSETRON HCL 4 MG/2ML IJ SOLN
INTRAMUSCULAR | Status: AC
  Filled 2019-07-19: qty 2

## 2019-07-19 MED ORDER — GATIFLOXACIN 0.5 % OP SOLN OPTIME - NO CHARGE
OPHTHALMIC | Status: DC | PRN
  Administered 2019-07-19: 1 [drp] via OPHTHALMIC

## 2019-07-19 MED ORDER — GATIFLOXACIN 0.5 % OP SOLN
OPHTHALMIC | Status: AC
  Filled 2019-07-19: qty 2.5

## 2019-07-19 MED ORDER — MEPERIDINE HCL 25 MG/ML IJ SOLN: 6.2500 mg | INTRAMUSCULAR | Status: DC | PRN

## 2019-07-19 MED ORDER — STERILE WATER FOR INJECTION IJ SOLN
INTRAMUSCULAR | Status: AC
  Filled 2019-07-19: qty 10

## 2019-07-19 MED ORDER — DEXTROSE 50 % IV SOLN: 25.0000 g | INTRAVENOUS | Status: DC

## 2019-07-19 MED ORDER — FENTANYL CITRATE (PF) 250 MCG/5ML IJ SOLN
INTRAMUSCULAR | Status: AC
  Filled 2019-07-19: qty 5

## 2019-07-19 MED ORDER — PREDNISOLONE ACETATE 1 % OP SUSP
OPHTHALMIC | Status: DC | PRN
  Administered 2019-07-19: 1 [drp] via OPHTHALMIC

## 2019-07-19 MED ORDER — TRIAMCINOLONE ACETONIDE 40 MG/ML IJ SUSP
INTRAMUSCULAR | Status: AC
  Filled 2019-07-19: qty 5

## 2019-07-19 MED ORDER — DORZOLAMIDE HCL-TIMOLOL MAL 2-0.5 % OP SOLN
OPHTHALMIC | Status: DC | PRN
  Administered 2019-07-19: 1 [drp] via OPHTHALMIC

## 2019-07-19 MED ORDER — DEXTROSE 50 % IV SOLN
INTRAVENOUS | Status: AC
  Administered 2019-07-19: 25 mL
  Filled 2019-07-19: qty 50

## 2019-07-19 MED ORDER — PROPARACAINE HCL 0.5 % OP SOLN
1.0000 [drp] | OPHTHALMIC | Status: AC | PRN
  Administered 2019-07-19 (×3): 1 [drp] via OPHTHALMIC
  Filled 2019-07-19: qty 15

## 2019-07-19 MED ORDER — SODIUM CHLORIDE 0.9 % IV SOLN: INTRAVENOUS | Status: DC

## 2019-07-19 MED ORDER — LIDOCAINE 2% (20 MG/ML) 5 ML SYRINGE
INTRAMUSCULAR | Status: DC | PRN
  Administered 2019-07-19: 100 mg via INTRAVENOUS

## 2019-07-19 MED ORDER — SODIUM CHLORIDE 0.9 % IV SOLN
INTRAVENOUS | Status: DC | PRN
Start: 2019-07-19 — End: 2019-07-19

## 2019-07-19 MED ORDER — EPINEPHRINE PF 1 MG/ML IJ SOLN
INTRAMUSCULAR | Status: DC | PRN
  Administered 2019-07-19: .3 mL

## 2019-07-19 MED ORDER — PHENYLEPHRINE HCL 10 % OP SOLN
1.0000 [drp] | OPHTHALMIC | Status: AC | PRN
  Administered 2019-07-19 (×3): 1 [drp] via OPHTHALMIC
  Filled 2019-07-19: qty 5

## 2019-07-19 MED ORDER — SUGAMMADEX SODIUM 200 MG/2ML IV SOLN
INTRAVENOUS | Status: DC | PRN
  Administered 2019-07-19: 200 mg via INTRAVENOUS

## 2019-07-19 MED ORDER — ACETAMINOPHEN 500 MG PO TABS
ORAL_TABLET | ORAL | Status: AC
  Administered 2019-07-19: 1000 mg via ORAL
  Filled 2019-07-19: qty 2

## 2019-07-19 MED ORDER — TRIAMCINOLONE ACETONIDE 40 MG/ML IJ SUSP
INTRAMUSCULAR | Status: DC | PRN
  Administered 2019-07-19: 40 mg via INTRAMUSCULAR

## 2019-07-19 MED ORDER — LACTATED RINGERS IV SOLN: INTRAVENOUS | Status: DC

## 2019-07-19 MED ORDER — ORAL CARE MOUTH RINSE: 15.0000 mL | Freq: Once | OROMUCOSAL | Status: AC

## 2019-07-19 MED ORDER — EPINEPHRINE PF 1 MG/ML IJ SOLN
INTRAMUSCULAR | Status: AC
  Filled 2019-07-19: qty 1

## 2019-07-19 MED ORDER — MIDAZOLAM HCL 2 MG/2ML IJ SOLN
INTRAMUSCULAR | Status: DC | PRN
  Administered 2019-07-19: 2 mg via INTRAVENOUS

## 2019-07-19 SURGICAL SUPPLY — 67 items
APPLICATOR COTTON TIP 6 STRL (MISCELLANEOUS) ×4 IMPLANT
APPLICATOR COTTON TIP 6IN STRL (MISCELLANEOUS) ×12
BAND WRIST GAS GREEN (MISCELLANEOUS) IMPLANT
BLADE EYE CATARACT 19 1.4 BEAV (BLADE) IMPLANT
BLADE MVR KNIFE 20G (BLADE) ×3 IMPLANT
BNDG EYE OVAL (GAUZE/BANDAGES/DRESSINGS) ×3 IMPLANT
CABLE BIPOLOR RESECTION CORD (MISCELLANEOUS) ×3 IMPLANT
CANNULA ANT CHAM MAIN (OPHTHALMIC RELATED) IMPLANT
CANNULA ANT/CHMB 27GA (MISCELLANEOUS) ×3 IMPLANT
CANNULA FLEX TIP 25G (CANNULA) ×3 IMPLANT
CANNULA TROCAR 23 GA VLV (OPHTHALMIC) IMPLANT
CANNULA VLV SOFT TIP 25GA (OPHTHALMIC) IMPLANT
CLOSURE STERI-STRIP 1/2X4 (GAUZE/BANDAGES/DRESSINGS) ×1
CLSR STERI-STRIP ANTIMIC 1/2X4 (GAUZE/BANDAGES/DRESSINGS) ×2 IMPLANT
COTTONBALL LRG STERILE PKG (GAUZE/BANDAGES/DRESSINGS) ×9 IMPLANT
COVER WAND RF STERILE (DRAPES) ×3 IMPLANT
DRAPE MICROSCOPE LEICA 46X105 (MISCELLANEOUS) ×3 IMPLANT
DRAPE OPHTHALMIC 77X100 STRL (CUSTOM PROCEDURE TRAY) ×3 IMPLANT
ERASER HMR WETFIELD 23G BP (MISCELLANEOUS) IMPLANT
FILTER BLUE MILLIPORE (MISCELLANEOUS) IMPLANT
FILTER STRAW FLUID ASPIR (MISCELLANEOUS) IMPLANT
FORCEPS GRIESHABER ILM 25G A (INSTRUMENTS) IMPLANT
GAS AUTO FILL CONSTEL (OPHTHALMIC) ×3
GAS AUTO FILL CONSTELLATION (OPHTHALMIC) ×1 IMPLANT
GAS WRIST BAND GREEN (MISCELLANEOUS)
GLOVE BIO SURGEON STRL SZ7.5 (GLOVE) ×6 IMPLANT
GLOVE BIOGEL M 7.0 STRL (GLOVE) ×3 IMPLANT
GOWN STRL REUS W/ TWL LRG LVL3 (GOWN DISPOSABLE) ×2 IMPLANT
GOWN STRL REUS W/ TWL XL LVL3 (GOWN DISPOSABLE) ×1 IMPLANT
GOWN STRL REUS W/TWL LRG LVL3 (GOWN DISPOSABLE) ×4
GOWN STRL REUS W/TWL XL LVL3 (GOWN DISPOSABLE) ×2
KIT BASIN OR (CUSTOM PROCEDURE TRAY) ×3 IMPLANT
KIT PERFLUORON PROCEDURE 5ML (MISCELLANEOUS) IMPLANT
LENS MACULAR ASPHERIC CONSTEL (OPHTHALMIC) IMPLANT
LENS VITRECTOMY FLAT OCLR DISP (MISCELLANEOUS) IMPLANT
LOOP FINESSE 25 GA (MISCELLANEOUS) ×3 IMPLANT
MICROPICK 25G (MISCELLANEOUS)
NEEDLE 18GX1X1/2 (RX/OR ONLY) (NEEDLE) ×3 IMPLANT
NEEDLE 25GX 5/8IN NON SAFETY (NEEDLE) ×9 IMPLANT
NEEDLE HYPO 30X.5 LL (NEEDLE) ×6 IMPLANT
NEEDLE PRECISIONGLIDE 27X1.5 (NEEDLE) IMPLANT
NS IRRIG 1000ML POUR BTL (IV SOLUTION) ×3 IMPLANT
PACK VITRECTOMY CUSTOM (CUSTOM PROCEDURE TRAY) ×3 IMPLANT
PAD ARMBOARD 7.5X6 YLW CONV (MISCELLANEOUS) ×6 IMPLANT
PAK PIK VITRECTOMY CVS 25GA (OPHTHALMIC) ×3 IMPLANT
PENCIL BIPOLAR 25GA STR DISP (OPHTHALMIC RELATED) ×3 IMPLANT
PICK MICROPICK 25G (MISCELLANEOUS) IMPLANT
PROBE DIATHERMY DSP 27GA (MISCELLANEOUS) IMPLANT
PROBE ENDO DIATHERMY 25G (MISCELLANEOUS) IMPLANT
PROBE LASER ILLUM FLEX CVD 25G (OPHTHALMIC) ×3 IMPLANT
REPL STRA BRUSH NEEDLE (NEEDLE) IMPLANT
RESERVOIR BACK FLUSH (MISCELLANEOUS) IMPLANT
RETRACTOR IRIS FLEX 25G GRIESH (INSTRUMENTS) IMPLANT
SCISSORS TIP ADVANCED DSP 25GA (INSTRUMENTS) ×3 IMPLANT
SET INJECTOR OIL FLUID CONSTEL (OPHTHALMIC) IMPLANT
SHIELD EYE LENSE ONLY DISP (GAUZE/BANDAGES/DRESSINGS) ×3 IMPLANT
SPONGE SURGIFOAM ABS GEL 12-7 (HEMOSTASIS) IMPLANT
STOPCOCK 4 WAY LG BORE MALE ST (IV SETS) IMPLANT
SUT VICRYL 7 0 TG140 8 (SUTURE) ×3 IMPLANT
SYR 10ML LL (SYRINGE) ×3 IMPLANT
SYR 20ML LL LF (SYRINGE) ×3 IMPLANT
SYR 5ML LL (SYRINGE) ×3 IMPLANT
SYR BULB EAR ULCER 3OZ GRN STR (SYRINGE) ×3 IMPLANT
SYR TB 1ML LUER SLIP (SYRINGE) ×6 IMPLANT
TOWEL GREEN STERILE FF (TOWEL DISPOSABLE) ×3 IMPLANT
TUBING HIGH PRESS EXTEN 6IN (TUBING) ×3 IMPLANT
WATER STERILE IRR 1000ML POUR (IV SOLUTION) ×3 IMPLANT

## 2019-07-19 NOTE — OR Nursing (Signed)
Green gas bracelet placed on pt left wrist

## 2019-07-19 NOTE — Progress Notes (Signed)
cbg 54, drank 2 cups of apple juice with added sugar and hypoglycemic protocol initiated/ see MAR

## 2019-07-19 NOTE — Interval H&P Note (Signed)
History and Physical Interval Note:  07/19/2019 9:35 AM  Barbara Donovan  has presented today for surgery, with the diagnosis of Epiretinal membrane, left eye.  The various methods of treatment have been discussed with the patient and family. After consideration of risks, benefits and other options for treatment, the patient has consented to  Procedure(s): 25g PARS PLANA VITRECTOMY WITH SILICON OIL REMOVAL (Left) MEMBRANE PEEL (Left) as a surgical intervention.  The patient's history has been reviewed, patient examined, no change in status, stable for surgery.  I have reviewed the patient's chart and labs.  Questions were answered to the patient's satisfaction.     Bernarda Caffey

## 2019-07-19 NOTE — Discharge Instructions (Signed)
POSTOPERATIVE INSTRUCTIONS  Your doctor has performed vitreoretinal surgery on you at [facility].  - Keep eye patched and shielded until seen by Dr. Akaash Vandewater 8 AM tomorrow in clinic - Do not use drops until return - FACE DOWN POSITIONING WHILE AWAKE - Sleep with belly down or on right side, avoid laying flat on back.    - No strenuous bending, stooping or lifting.  - You may not drive until further notice.  - If your doctor used a gas bubble in your eye during the procedure he will advise you on postoperative positioning. If you have a gas bubble you will be wearing a green bracelet that was applied in the operating room. The green bracelet should stay on as long as the gas bubble is in your eye. While the gas bubble is present you should not fly in an airplane. If you require general anesthesia while the gas bubble is present you must notify your anesthesiologist that an intraocular gas bubble is present so he can take the appropriate precautions.  - Tylenol or any other over-the-counter pain reliever can be used according to your doctor. If more pain medicine is required, your doctor will have a prescription for you.  - You may read, go up and down stairs, and watch television.     Barbara Donovan, M.D., Ph.D.  

## 2019-07-19 NOTE — Transfer of Care (Signed)
Immediate Anesthesia Transfer of Care Note  Patient: Barbara Donovan  Procedure(s) Performed: 25g PARS PLANA VITRECTOMY WITH SILICON OIL REMOVAL (Left Eye) MEMBRANE PEEL (Left Eye) PHOTOCOAGULATION WITH LASER (Left Eye) INSERTION OF GAS (Left Eye)  Patient Location: PACU  Anesthesia Type:General  Level of Consciousness: drowsy, patient cooperative and responds to stimulation  Airway & Oxygen Therapy: Patient Spontanous Breathing and Patient connected to nasal cannula oxygen  Post-op Assessment: Report given to RN and Post -op Vital signs reviewed and stable  Post vital signs: Reviewed and stable  Last Vitals:  Vitals Value Taken Time  BP    Temp    Pulse    Resp    SpO2      Last Pain:  Vitals:   07/19/19 0928  TempSrc:   PainSc: 0-No pain         Complications: No complications documented.

## 2019-07-19 NOTE — Op Note (Signed)
Date of Surgery: 07.01.2021   Pre-Op Diagnosis:  1. History of retinal detachment with silicon oil injection, left eye 2. Epiretinal membrane, left eye   Post-op Diagnosis:  1. History of retinal detachment with silicon oil injection, left eye 2. Pseudohypopyon, left eye -- silicon oil in AC 3. Epiretinal membrane, left eye   Procedure:  1. 25 gauge [place] Vitrectomy, left eye 2. Anterior Chamber Washout 3. Silicone Oil Removal 4. Epiretinal membrane and ILM peel 5. Endolaser -- panretinal photocoagulation 6. Injection of 20% SF6 Gas 7. Intravitreal injection of Avastin, left eye   CPT Codes:  1. 23536 LT 2. 65800 LT   Surgeon: Bernarda Caffey, MD,PhD   Assistant: Ernest Mallick, Ophthalmic Assistant   Anesthesia: General   Estimated Blood Loss <5 cc   Complications: None   Description of the Procedure:    Patient was seen in the pre-operative area. After all remaining questions were answered, the operative LEFT EYE was marked and the informed consent was confirmed.  The patient was brought back to the operating room by the nursing staff. An additional time-out was performed. All in attendance (the nursing staff, anesthesia staff, and ophthalmology staff) agreed upon the patient, type of surgery, and the location of surgery. The operative eye was prepped and draped in sterile ophthalmic fashion.    Valved trocars were placed at the 0430, 0200, and 1000 oclock meridians. The infusion line was brought to the operative field and found to be functional and free of air bubbles. The infusion line was inserted in the inferotemporal trocar, visualized with light pipe, and turned on. The infusion was secured with steristrips.   A pseudohypopyon of emulsified silicone oil was obscuring visualization of the posterior pole. Thus, a 20g MVR blade was used to create an anterior chamber paracentesis at 1030 and an anterior chamber washout was performed using sterile BSS on a 27g cannula. This  cleared the silicon oil bubbles from the [facility] and improved visualization of the posterior pole. The corneal incision was hydrated to create a water-tight seal   Under widefield BIOM visualization, silicone oil was removed with the extraction syringe. Multiple air-fluid exchanges were performed with the vitrectomy cutter. Next using the wide-field viewing system, kenalog was injected into the vitreous and a limited posterior vitrectomy was performed. There was negligible remaining vitreous following her previous vitrectomy. Inspection of the posterior pole revealed extensive ERM and preretinal fibrosis overlying the macula and optic disc. TissueBlue was used to stain the ILM and counterstain ERM. A macular contact was then placed on the eye and the ILM and overlying ERM were carefully peeled en bloc with ILM forceps and a 25g finesse loop. Of note, there was significant tractional membranes overlying the inferotemporal arcades. These were carefully dissected using the vitrectomy cutter and 25g intraocular scissors. Following theses maneuvers, the retina was good position and attached over the macula and the macula was clear of all membranes.   Next the endolaser was used to perform fill-in posterior PRP just outside the arcades and posterior to the existing PRP 360. Then, a complete fluid-air exchange was performed using the soft-tipped extrusion cannula.  Next, the superotemporal trocar was removed and sutured with 7-0 vicryl in an interrupted fashion. A complete air to 20% SF6 gas exchange was performed through the infusion cannula and vented through the superonasal trocar using the extrusion cannula. The superonasal trocar and infusion cannula and associated trocar were then removed and sutured with 7-0 vicryl in an interrupted fashion. The eye's  intraocular pressure was found to be at physiologic level and stable. Subconjunctival injections of antiobiotic and kenalog were administered. Next, 5% betadine was  applied to the eye and intravitreal Avastin was injected ~3.5 mm posterior to the limbus in the superonasal quadrant. Another drop of betadine was applied to the injection site post-injection, then rinsed out with sterile BSS. The lid speculum and drapes were removed. Drops of an antibiotic, antihypertensives, and steroid were given. Copious antibiotic ointment was instilled into the eye. The eye was patched and shielded. The patient tolerated the procedure well without any intraoperative or immediate postoperative complications. The patient was taken to the recovery room in good condition. The patient was instructed to maintain a strict face-down position and will be seen by Dr. Coralyn Pear tomorrow morning in clinic.

## 2019-07-19 NOTE — Anesthesia Procedure Notes (Signed)
Procedure Name: Intubation Date/Time: 07/19/2019 11:54 AM Performed by: Michele Rockers, CRNA Pre-anesthesia Checklist: Patient identified, Emergency Drugs available, Suction available and Patient being monitored Patient Re-evaluated:Patient Re-evaluated prior to induction Oxygen Delivery Method: Circle system utilized Preoxygenation: Pre-oxygenation with 100% oxygen Induction Type: IV induction Ventilation: Mask ventilation without difficulty Laryngoscope Size: Mac and 3 Grade View: Grade II Tube type: Oral Tube size: 7.0 mm Number of attempts: 2 Airway Equipment and Method: Stylet and Oral airway Placement Confirmation: ETT inserted through vocal cords under direct vision,  positive ETCO2 and breath sounds checked- equal and bilateral Secured at: 21 cm Tube secured with: Tape Dental Injury: Teeth and Oropharynx as per pre-operative assessment

## 2019-07-19 NOTE — Brief Op Note (Signed)
07/19/2019  2:34 PM  PATIENT:  Barbara Donovan  29 y.o. female  PRE-OPERATIVE DIAGNOSIS:  Epiretinal membrane, left eye  POST-OPERATIVE DIAGNOSIS:  Epiretinal membrane, left eye  PROCEDURE:  Procedure(s) with comments: 25g PARS PLANA VITRECTOMY WITH SILICON OIL REMOVAL (Left) MEMBRANE PEEL (Left) PHOTOCOAGULATION WITH LASER (Left) INSERTION OF GAS (Left) - SF6  SURGEON:  Surgeon(s) and Role:    Bernarda Caffey, MD - Primary  ASSISTANTS: Ernest Mallick, Ophthalmic Assistant    ANESTHESIA:   general  EBL:  0 mL   BLOOD ADMINISTERED:none  DRAINS: none   LOCAL MEDICATIONS USED:  NONE  SPECIMEN:  No Specimen  DISPOSITION OF SPECIMEN:  N/A  COUNTS:  YES  TOURNIQUET:  * No tourniquets in log *  DICTATION: .Note written in EPIC  PLAN OF CARE: Discharge to home after PACU  PATIENT DISPOSITION:  PACU - hemodynamically stable.   Delay start of Pharmacological VTE agent (>24hrs) due to surgical blood loss or risk of bleeding: not applicable

## 2019-07-19 NOTE — Progress Notes (Signed)
rn not aware of insulin pump on pt / adjusted rate to basilar rate only per pt

## 2019-07-20 ENCOUNTER — Encounter (HOSPITAL_COMMUNITY): Payer: Self-pay | Admitting: Ophthalmology

## 2019-07-20 ENCOUNTER — Ambulatory Visit (INDEPENDENT_AMBULATORY_CARE_PROVIDER_SITE_OTHER): Payer: BC Managed Care – PPO | Admitting: Ophthalmology

## 2019-07-20 DIAGNOSIS — H35372 Puckering of macula, left eye: Secondary | ICD-10-CM

## 2019-07-20 DIAGNOSIS — I10 Essential (primary) hypertension: Secondary | ICD-10-CM

## 2019-07-20 DIAGNOSIS — H25811 Combined forms of age-related cataract, right eye: Secondary | ICD-10-CM

## 2019-07-20 DIAGNOSIS — Z961 Presence of intraocular lens: Secondary | ICD-10-CM

## 2019-07-20 DIAGNOSIS — H3342 Traction detachment of retina, left eye: Secondary | ICD-10-CM

## 2019-07-20 DIAGNOSIS — H4313 Vitreous hemorrhage, bilateral: Secondary | ICD-10-CM

## 2019-07-20 DIAGNOSIS — H35033 Hypertensive retinopathy, bilateral: Secondary | ICD-10-CM

## 2019-07-20 DIAGNOSIS — E103513 Type 1 diabetes mellitus with proliferative diabetic retinopathy with macular edema, bilateral: Secondary | ICD-10-CM

## 2019-07-20 DIAGNOSIS — H3581 Retinal edema: Secondary | ICD-10-CM

## 2019-07-20 NOTE — Anesthesia Postprocedure Evaluation (Signed)
Anesthesia Post Note  Patient: Barbara Donovan  Procedure(s) Performed: 25g PARS PLANA VITRECTOMY WITH SILICON OIL REMOVAL (Left Eye) MEMBRANE PEEL (Left Eye) PHOTOCOAGULATION WITH LASER (Left Eye) INSERTION OF GAS (Left Eye)     Patient location during evaluation: PACU Anesthesia Type: General Level of consciousness: sedated and patient cooperative Pain management: pain level controlled Vital Signs Assessment: post-procedure vital signs reviewed and stable Respiratory status: spontaneous breathing Cardiovascular status: stable Anesthetic complications: no   No complications documented.  Last Vitals:  Vitals:   07/19/19 1529 07/19/19 1530  BP: (!) 153/87 (!) 153/87  Pulse:  67  Resp:  (!) 21  Temp:  37.1 C  SpO2:  97%    Last Pain:  Vitals:   07/19/19 1515  TempSrc:   PainSc: 0-No pain                 Nolon Nations

## 2019-07-24 NOTE — Progress Notes (Signed)
[facility] Note  07/26/2019     CHIEF COMPLAINT Patient presents for Retina Follow Up   HISTORY OF PRESENT ILLNESS: Barbara Donovan is a 29 y.o. female who presents to the clinic today for:   HPI    Retina Follow Up    Patient presents with  Other.  In left eye.  This started 1 week ago.  Severity is moderate.  I, the attending physician,  performed the HPI with the patient and updated documentation appropriately.          Comments    Patient here for 1 week for retina follow up for POV OS. Patient states vision doing the same. No eye pain. OS feel strange to open eye. The bubble is middle lower area. Eye waters.       Last edited by Bernarda Caffey, MD on 07/26/2019  8:53 AM. (History)    pt states she is doing better, her eye is still swolllen and hard to open, she is maintaing face down positioning 50 mins an hour   Referring physician: Fanny Bien, MD [address] [place],  [place] [address]  HISTORICAL INFORMATION:   Selected notes from the MEDICAL RECORD NUMBER Referred by Dr. [name] for concern of bilateral CRVO    CURRENT MEDICATIONS: Current Outpatient Medications (Ophthalmic Drugs)  Medication Sig  . prednisoLONE acetate (PRED FORTE) 1 % ophthalmic suspension Place 1 drop into the left eye 4 (four) times daily.   No current facility-administered medications for this visit. (Ophthalmic Drugs)   Current Outpatient Medications (Other)  Medication Sig  . Blood Pressure Monitoring (OMRON 3 SERIES BP MONITOR) DEVI 1 each by Does not apply route daily. Take daily blood pressure. Please report back to Dr. Radford Pax in 2 wks.  . carvedilol (COREG) 25 MG tablet Take 1 tablet (25 mg total) by mouth 2 (two) times daily.  Marland Kitchen diltiazem (CARDIZEM CD) 240 MG 24 hr capsule Take 1 capsule (240 mg total) by mouth daily.  . furosemide (LASIX) 20 MG tablet Take 2 tablets (40 mg total) by mouth daily as needed for edema. (Patient taking  differently: Take 20 mg by mouth daily as needed for edema. )  . hydrALAZINE (APRESOLINE) 50 MG tablet Take 2 tablets (100 mg total) by mouth in the morning AND 1 tablet (50 mg total) daily at 12 noon AND 2 tablets (100 mg total) every evening. (Patient taking differently: Take 50 mg by mouth three times daily)  . insulin aspart (NOVOLOG) 100 UNIT/ML injection FOR USE IN PUMP, TOTAL OF 60 UNITS PER DAY.  Marland Kitchen Insulin Pen Needle (PEN NEEDLES) 32G X 5 MM MISC 1 Device by Does not apply route 4 (four) times daily.  . norethindrone (MICRONOR) 0.35 MG tablet Take 1 tablet by mouth daily.  [name] VERIO test strip USE AS DIRECTED 5 TIMES DAILY   No current facility-administered medications for this visit. (Other)      REVIEW OF SYSTEMS: ROS    Positive for: Neurological, Genitourinary, Endocrine, Eyes, Respiratory   Negative for: Constitutional, Gastrointestinal, Skin, Musculoskeletal, HENT, Cardiovascular, Psychiatric, Allergic/Imm, Heme/Lymph   Last edited by Theodore Demark, COA on 07/26/2019  8:24 AM. (History)       ALLERGIES No Known Allergies  PAST MEDICAL HISTORY Past Medical History:  Diagnosis Date  . Asthma    as a child, no problems as an adult, no inhaler  . Cataract    NS OU  .  Chronic hypertension during pregnancy, antepartum 08/19/2017  . Dehydration 01/28/2018  . Depression during pregnancy, antepartum 07/07/2017   6/20: Short trial of zoloft previously, reports didn't help much but also didn't give it a chance Discussed r/b/a SSRIs in pregnancy, agrees to try Zoloft again, rx sent No SI/HI/red flags  . Diabetes ([place])    TYPE I  . Diabetic retinopathy ([place]) 06/09/2017   07/2017 with bilateral severe diabetic non-proliferative retinopathy with macular edema.  Marland Kitchen HTN (hypertension)   . Hypertensive retinopathy    OU  . Hypokalemia 01/22/2018  . Hypomagnesemia 01/28/2018  . Intractable nausea and vomiting 01/22/2018  . Intrauterine growth restriction (IUGR) affecting care of  mother 12/22/2017  . Morbid obesity ([place])   . 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 Proteinuria due to type 1 diabetes mellitus ([place]) 11/02/2017   Baseline Pr: Cr 10.23  . Severe hyperemesis gravidarum 10/30/2017  . Type I diabetes mellitus ([place]) 07/07/2017   Current Diabetic Medications:  Insulin  [x]  Aspirin 81 mg daily after 12 weeks (? A2/B GDM)  Required Referrals for A1GDM or A2GDM: [x]  Diabetes Education and Testing Supplies [x]  Nutrition Cousult  For A2/B GDM or higher classes of DM [x]  Diabetes Education and Testing Supplies [x]  Nutrition Counsult [x]  Fetal ECHO after 20 weeks  [x]  Eye exam for retina evaluation - severe retinopathy 7/19  Base  . Ventricular septal defect (VSD) of fetus in singleton pregnancy, antepartum 09/30/2017   May go to newborn nursery per Dr. Lenard Simmer Echo prior to discharge   Past Surgical History:  Procedure Laterality Date  . [place] VITRECTOMY WITH 20 GAUGE MVR PORT FOR MACULAR HOLE Left 07/20/2018   Procedure: 25 GAUGE PARS PLANA VITRECTOMY LEFT EYE;  Surgeon: Bernarda Caffey, MD;  Location: [facility];  Service: Ophthalmology;  Laterality: Left;  . CESAREAN SECTION N/A 12/26/2017   Procedure: CESAREAN SECTION;  Surgeon: Osborne Oman, MD;  Location: [facility];  Service: Obstetrics;  Laterality: N/A;  . EYE EXAMINATION UNDER ANESTHESIA Right 07/20/2018   Procedure: Eye Exam Under Anesthesia RIGHT EYE;  Surgeon: Bernarda Caffey, MD;  Location: [facility];  Service: Ophthalmology;  Laterality: Right;  . EYE SURGERY Left 07/2018  . GAS INSERTION Left 07/19/2019   Procedure: INSERTION OF GAS;  Surgeon: Bernarda Caffey, MD;  Location: [facility];  Service: Ophthalmology;  Laterality: Left;  SF6  . INJECTION OF SILICONE OIL Left 05/21/6642   Procedure: Injection Of Silicone Oil LEFT EYE;  Surgeon: Bernarda Caffey, MD;  Location: [facility];  Service: Ophthalmology;  Laterality: Left;  . LASER PHOTO ABLATION Right 07/20/2018   Procedure: Laser Photo Ablation RIGHT EYE;   Surgeon: Bernarda Caffey, MD;  Location: [facility];  Service: Ophthalmology;  Laterality: Right;  . MEMBRANE PEEL Left 07/20/2018   Procedure: Membrane Peel LEFT EYE;  Surgeon: Bernarda Caffey, MD;  Location: [facility];  Service: Ophthalmology;  Laterality: Left;  . MEMBRANE PEEL Left 07/19/2019   Procedure: MEMBRANE PEEL;  Surgeon: Bernarda Caffey, MD;  Location: [facility];  Service: Ophthalmology;  Laterality: Left;  . MITOMYCIN C APPLICATION Bilateral 0/03/4740   Procedure: Avastin Application;  Surgeon: Bernarda Caffey, MD;  Location: [facility];  Service: Ophthalmology;  Laterality: Bilateral;  . PHOTOCOAGULATION WITH LASER Left 07/20/2018   Procedure: Photocoagulation With Laser LEFT EYE;  Surgeon: Bernarda Caffey, MD;  Location: [facility];  Service: Ophthalmology;  Laterality: Left;  . PHOTOCOAGULATION WITH LASER Left 07/19/2019   Procedure: PHOTOCOAGULATION WITH LASER;  Surgeon: Bernarda Caffey, MD;  Location:  [facility];  Service: Ophthalmology;  Laterality: Left;  . RETINAL DETACHMENT SURGERY Left 07/20/2018   Dr. Bernarda Caffey  . SILICON OIL REMOVAL Left 07/19/2019   Procedure: 25g PARS PLANA VITRECTOMY WITH SILICON OIL REMOVAL;  Surgeon: Bernarda Caffey, MD;  Location: [facility];  Service: Ophthalmology;  Laterality: Left;  . WISDOM TOOTH EXTRACTION      FAMILY HISTORY Family History  Problem Relation Age of Onset  . Diabetes Mother   . Aneurysm Mother 25  . Seizures Mother   . Diabetes Father   . Cataracts Father   . COPD Father   . Heart attack Father   . Heart disease Father   . Healthy Sister   . Healthy Daughter   . Stroke Maternal Grandfather   . Amblyopia Neg Hx   . Blindness Neg Hx   . Glaucoma Neg Hx   . Macular degeneration Neg Hx   . Retinal detachment Neg Hx   . Strabismus Neg Hx   . Retinitis pigmentosa Neg Hx     SOCIAL HISTORY Social History   Tobacco Use  . Smoking status: Never Smoker  . Smokeless tobacco: Never Used  Vaping Use  . Vaping Use: Never used  Substance Use Topics  . Alcohol use:  Not Currently    Comment: SOCIALLY  . Drug use: Never         OPHTHALMIC EXAM:  Base Eye Exam    Visual Acuity (Snellen - Linear)      Right Left   Dist cc 20/25 +2 HM 1'   Dist ph cc 20/20 -1 20/200   Correction: Glasses       Tonometry (Tonopen, 8:20 AM)      Right Left   Pressure def 14       Pupils      Dark Light Shape React APD   Right 4 3 Round Brisk None   Left 6 6 dilated         Visual Fields (Counting fingers)      Left Right     Full   Restrictions Total superior temporal, superior nasal deficiencies        Extraocular Movement      Right Left    Full Full       Neuro/Psych    Oriented x3: Yes   Mood/Affect: Normal       Dilation    Left eye: 1.0% Mydriacyl, 2.5% Phenylephrine @ 8:20 AM        Slit Lamp and Fundus Exam    Slit Lamp Exam      Right Left   Lids/Lashes Normal Normal   Conjunctiva/Sclera White and quiet White and quiet, sutures dissolving   Cornea trace Punctate epithelial erosions Well healed cataract wounds, clear   Anterior Chamber Deep and quiet Deep and quiet   Iris Round, moderate dilation, No NVI Round and dilated to 6.68mm, No NVI   Lens trace Nuclear sclerosis and cortical changes PCIOL in good position; 2+ PCO   Vitreous VH cleared centrally, Blood stained vitreous condensations and clots settled inferiorly - now white in color post vitrectomy, 45% gas bubble       Fundus Exam      Right Left   Disc  Trace Pallor, Sharp rim   C/D Ratio 0.3 0.4   Macula  flat, Blunted foveal reflex, scattered Microaneurysms   Vessels  Attenuated, Tortuous, inferior sclerosis, fibrosis along IT arcades vastly improved   Periphery  attached under gas; focal patches  of SO emulsification; peripapillary fibrosis regressing under oil, good posterior PRP laser changes        Refraction    Wearing Rx      Sphere Cylinder   Right -2.00 Sphere   Left -1.50 Sphere          IMAGING AND PROCEDURES  Imaging and Procedures for  @TODAY @           ASSESSMENT/PLAN:    ICD-10-CM   1. 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 Traction detachment of left retina  H33.42   3. Vitreous hemorrhage of both eyes ([place])  H43.13   4. Epiretinal membrane (ERM) of left eye  H35.372   5. Retinal edema  H35.81   6. Essential hypertension  I10   7. Hypertensive retinopathy of both eyes  H35.033   8. Combined forms of age-related cataract of right eye  H25.811   9. Pseudophakia  Z96.1     1-3. 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 (OS > OD)  - s/p PRP OS (11.26.19)  - s/p PRP OD (06.01.20)   - lost to f/u following 11.26.19 appt due to complicated pregnancy, premature birth of baby w/ extended NICU stay and congenital heart defect, and post-partum health issues OS  - history of vision loss OS onset December 2019  - OS with chronic VH and TRD, likely present since Dec 2019  - s/p IVA OS #1 (06.23.20), #2 (07.02.20), #3 (04.02.21)  - s/p IVA OD #2 (11.30.20)  - s/p PPV/MP/EL/silicon oil + IVA OS for VH and TRD from DM1, 07.02.20             - doing well -- had cataract surgery OS with Dr. Eulas Post, 12.15.20 -- looks good, mild PCO             - retina attached under silicon oil -- persistent nasal ERM w/ edema OS  - mild SO emulsification OS  - now s/p PPV/SOR removal/Tissue Blue stain/MP/20% SF6 gas, IVA OS, 07.01.21             - doing well              - IOP 14             - cont   PF 4x/day OD                          zymaxid QID OS -- stop when bottle runs out                         Atropine BID OS -- stop when bottle runs out                         Brimonidine QD OS                          PSO ung QID OS              - cont face down positioning 30 min/hr; avoid laying flat on back              - eye shield when sleeping x1 more week             - post op drop and positioning instructions reviewed   -f/u  2-3 weeks, POV  OD  - s/p  fill in PRP and IVA OD 07.02.2020 in operating room  - new onset diffuse VH OD since ~11.26.20   - s/p IVA OD #2 (11.30.20) -- VH stably improved today  - BCVA 20/20  4,5. Epiretinal membrane, left eye   - focal ERM with retinal thickening nasal macula   - s/p TissueBlue assisted membrane peel OS as above (7.1.21)   6,7. Hypertensive retinopathy OU  - there was concern for pregnancy related hypertension (I.e. Pre-eclampsia, Eclampsia, HELLP, etc) during pregnancy  - also during pregnancy (7.17.2019), had bilateral CRVO w/ CME (OD > OS) s/p PRP OS  - discussed importance of tight BP control  - monitor  8. Mixed cataract OD  - The symptoms of cataract, surgical options, and treatments and risks were discussed with patient.  - not visually significant    9. Pseudophakia OS  - s/p CE/IOL OS w/ Dr. Eulas Post, 12.15.2020  - beautiful surgery, doing well  - mild PCO  - monitor -- discussed possibility of YAG laser in office or capsulotomy at the time of oil removal OS    Ophthalmic Meds Ordered this visit:  Meds ordered this encounter  Medications  . prednisoLONE acetate (PRED FORTE) 1 % ophthalmic suspension    Sig: Place 1 drop into the left eye 4 (four) times daily.    Dispense:  15 mL    Refill:  0      Return for f/u 2-3 weeks, POV OS.  There are no Patient Instructions on file for this visit.   Explained the diagnoses, plan, and follow up with the patient and they expressed understanding.  Patient expressed understanding of the importance of proper follow up care.  This document serves as a record of services personally performed by Gardiner Sleeper, MD, PhD. It was created on their behalf by Leonie Douglas, an ophthalmic technician. The creation of this record is the provider's dictation and/or activities during the visit.    Electronically signed by: Leonie Douglas COA, 07/26/19  11:18 PM   Gardiner Sleeper, M.D., Ph.D. Diseases & Surgery of the Retina and Vitreous [facility]  I have reviewed the above documentation for accuracy and completeness, and I agree with the above. Gardiner Sleeper, M.D., Ph.D. 07/26/19 11:18 PM   Abbreviations: M myopia (nearsighted); A astigmatism; H hyperopia (farsighted); P presbyopia; Mrx spectacle prescription;  CTL contact lenses; OD right eye; OS left eye; OU both eyes  XT exotropia; ET esotropia; PEK punctate epithelial keratitis; PEE punctate epithelial erosions; DES dry eye syndrome; MGD meibomian gland dysfunction; ATs artificial tears; PFAT's preservative free artificial tears; [place] nuclear sclerotic cataract; PSC posterior subcapsular cataract; ERM epi-retinal membrane; PVD posterior vitreous detachment; RD retinal detachment; DM diabetes mellitus; DR diabetic retinopathy; NPDR non-proliferative diabetic retinopathy; PDR proliferative diabetic retinopathy; CSME clinically significant macular edema; DME diabetic macular edema; dbh dot blot hemorrhages; CWS cotton wool spot; POAG primary open angle glaucoma; C/D cup-to-disc ratio; HVF humphrey visual field; GVF goldmann visual field; OCT optical coherence tomography; IOP intraocular pressure; BRVO Branch retinal vein occlusion; CRVO central retinal vein occlusion; CRAO central retinal artery occlusion; BRAO branch retinal artery occlusion; RT retinal tear; SB scleral buckle; PPV pars plana vitrectomy; VH Vitreous hemorrhage; PRP panretinal laser photocoagulation; IVK intravitreal kenalog; VMT vitreomacular traction; MH Macular hole;  NVD neovascularization of the disc; NVE neovascularization elsewhere; AREDS age related eye disease study; ARMD age related macular degeneration; POAG primary open  angle glaucoma; EBMD epithelial/anterior basement membrane dystrophy; ACIOL anterior chamber intraocular lens; IOL intraocular lens; PCIOL posterior chamber intraocular lens; Phaco/IOL phacoemulsification with intraocular lens placement; [place] photorefractive keratectomy; LASIK  laser assisted in situ keratomileusis; HTN hypertension; DM diabetes mellitus; COPD chronic obstructive pulmonary disease

## 2019-07-26 ENCOUNTER — Other Ambulatory Visit: Payer: Self-pay

## 2019-07-26 ENCOUNTER — Ambulatory Visit (INDEPENDENT_AMBULATORY_CARE_PROVIDER_SITE_OTHER): Payer: BC Managed Care – PPO | Admitting: Ophthalmology

## 2019-07-26 ENCOUNTER — Encounter (INDEPENDENT_AMBULATORY_CARE_PROVIDER_SITE_OTHER): Payer: Self-pay | Admitting: Ophthalmology

## 2019-07-26 DIAGNOSIS — H3581 Retinal edema: Secondary | ICD-10-CM

## 2019-07-26 DIAGNOSIS — Z961 Presence of intraocular lens: Secondary | ICD-10-CM

## 2019-07-26 DIAGNOSIS — H25811 Combined forms of age-related cataract, right eye: Secondary | ICD-10-CM

## 2019-07-26 DIAGNOSIS — I10 Essential (primary) hypertension: Secondary | ICD-10-CM

## 2019-07-26 DIAGNOSIS — H35372 Puckering of macula, left eye: Secondary | ICD-10-CM

## 2019-07-26 DIAGNOSIS — H35033 Hypertensive retinopathy, bilateral: Secondary | ICD-10-CM

## 2019-07-26 DIAGNOSIS — H4313 Vitreous hemorrhage, bilateral: Secondary | ICD-10-CM

## 2019-07-26 DIAGNOSIS — H3342 Traction detachment of retina, left eye: Secondary | ICD-10-CM

## 2019-07-26 DIAGNOSIS — E103513 Type 1 diabetes mellitus with proliferative diabetic retinopathy with macular edema, bilateral: Secondary | ICD-10-CM

## 2019-07-26 MED ORDER — PREDNISOLONE ACETATE 1 % OP SUSP
1.0000 [drp] | Freq: Four times a day (QID) | OPHTHALMIC | 0 refills | Status: DC
Start: 2019-07-26 — End: 2019-09-15

## 2019-08-01 ENCOUNTER — Other Ambulatory Visit: Payer: Self-pay

## 2019-08-02 ENCOUNTER — Ambulatory Visit: Payer: BC Managed Care – PPO

## 2019-08-03 ENCOUNTER — Other Ambulatory Visit: Payer: Self-pay

## 2019-08-03 NOTE — Telephone Encounter (Signed)
This is Dr. Parklawn's pt.  °

## 2019-08-06 NOTE — Progress Notes (Signed)
[facility] Note  08/10/2019     CHIEF COMPLAINT Patient presents for Post-op Follow-up   HISTORY OF PRESENT ILLNESS: Barbara Donovan is a 29 y.o. female who presents to the clinic today for:   HPI    Post-op Follow-up    In left eye.  Discomfort includes none.  Negative for pain, itching, foreign body sensation, tearing, discharge and floaters.  Vision is stable.  I, the attending physician,  performed the HPI with the patient and updated documentation appropriately.          Comments    Pt states she can now open her eye, she cannot see the gas bubble anymore, she thinks her vision has gotten a little better since her last visit, she is using PF and PSO Ung qid, brimonidine Qdaily and atropine BID OS       Last edited by Bernarda Caffey, MD on 08/12/2019 11:49 PM. (History)    pt states the gas bubble is gone   Referring physician: Fanny Bien, MD [address] [place],  [place] [address]  HISTORICAL INFORMATION:   Selected notes from the MEDICAL RECORD NUMBER Referred by Dr. [name] for concern of bilateral CRVO    CURRENT MEDICATIONS: Current Outpatient Medications (Ophthalmic Drugs)  Medication Sig   prednisoLONE acetate (PRED FORTE) 1 % ophthalmic suspension Place 1 drop into the left eye 4 (four) times daily.   No current facility-administered medications for this visit. (Ophthalmic Drugs)   Current Outpatient Medications (Other)  Medication Sig   Blood Pressure Monitoring (OMRON 3 SERIES BP MONITOR) DEVI 1 each by Does not apply route daily. Take daily blood pressure. Please report back to Dr. Radford Pax in 2 wks.   carvedilol (COREG) 25 MG tablet Take 1 tablet (25 mg total) by mouth 2 (two) times daily.   diltiazem (CARDIZEM CD) 240 MG 24 hr capsule Take 1 capsule (240 mg total) by mouth daily.   furosemide (LASIX) 20 MG tablet Take 2 tablets (40 mg total) by mouth daily as needed for edema. (Patient taking  differently: Take 20 mg by mouth daily as needed for edema. )   hydrALAZINE (APRESOLINE) 50 MG tablet Take 2 tablets (100 mg total) by mouth in the morning AND 1 tablet (50 mg total) daily at 12 noon AND 2 tablets (100 mg total) every evening. (Patient taking differently: Take 50 mg by mouth three times daily)   insulin aspart (NOVOLOG) 100 UNIT/ML injection FOR USE IN PUMP, TOTAL OF 80 UNITS PER DAY.   Insulin Pen Needle (PEN NEEDLES) 32G X 5 MM MISC 1 Device by Does not apply route 4 (four) times daily.   norethindrone (MICRONOR) 0.35 MG tablet Take 1 tablet by mouth daily.   ONETOUCH VERIO test strip USE AS DIRECTED 5 TIMES DAILY   No current facility-administered medications for this visit. (Other)      REVIEW OF SYSTEMS: ROS    Positive for: Endocrine, Eyes   Negative for: Constitutional, Gastrointestinal, Neurological, Skin, Genitourinary, Musculoskeletal, HENT, Cardiovascular, Respiratory, Psychiatric, Allergic/Imm, Heme/Lymph   Last edited by Debbrah Alar, COT on 08/10/2019  8:36 AM. (History)       ALLERGIES No Known Allergies  PAST MEDICAL HISTORY Past Medical History:  Diagnosis Date   Asthma    as a child, no problems as an adult, no inhaler   Cataract    NS OU   Chronic hypertension during pregnancy, antepartum 08/19/2017   Dehydration 01/28/2018  Depression during pregnancy, antepartum 07/07/2017   6/20: Short trial of zoloft previously, reports didn't help much but also didn't give it a chance Discussed r/b/a SSRIs in pregnancy, agrees to try Zoloft again, rx sent No SI/HI/red flags   Diabetes ([place])    TYPE I   Diabetic retinopathy ([place]) 06/09/2017   07/2017 with bilateral severe diabetic non-proliferative retinopathy with macular edema.   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 A2/B GDM)  Required Referrals for A1GDM or A2GDM: [x]  Diabetes Education and Testing Supplies [x]  Nutrition Cousult  For A2/B GDM or higher classes of DM [x]  Diabetes Education and Testing Supplies [x]  Nutrition Counsult [x]  Fetal ECHO after 20 weeks  [x]  Eye exam for retina evaluation - severe retinopathy 7/19  Base   Ventricular septal defect (VSD) of fetus in singleton pregnancy, antepartum 09/30/2017   May go to newborn nursery per Dr. Lenard Simmer Echo prior to discharge   Past Surgical History:  Procedure Laterality Date   25 GAUGE PARS PLANA VITRECTOMY WITH 20 GAUGE MVR PORT FOR MACULAR HOLE Left 07/20/2018   Procedure: 25 GAUGE PARS PLANA VITRECTOMY LEFT EYE;  Surgeon: Bernarda Caffey, MD;  Location: [facility];  Service: Ophthalmology;  Laterality: Left;   CESAREAN SECTION N/A 12/26/2017   Procedure: CESAREAN SECTION;  Surgeon: Osborne Oman, MD;  Location: [facility];  Service: Obstetrics;  Laterality: N/A;   EYE EXAMINATION UNDER ANESTHESIA Right 07/20/2018   Procedure: Eye Exam Under Anesthesia RIGHT EYE;  Surgeon: Bernarda Caffey, MD;  Location: [facility];  Service: Ophthalmology;  Laterality: Right;   EYE SURGERY Left 07/2018   GAS INSERTION Left 07/19/2019   Procedure: INSERTION OF GAS;  Surgeon: Bernarda Caffey, MD;  Location: [facility];  Service: Ophthalmology;  Laterality: Left;  SF6   INJECTION OF SILICONE OIL Left 01/23/1094   Procedure: Injection Of Silicone Oil LEFT EYE;  Surgeon: Bernarda Caffey, MD;  Location: [facility];  Service: Ophthalmology;  Laterality: Left;   LASER PHOTO ABLATION Right 07/20/2018   Procedure: Laser Photo Ablation RIGHT EYE;   Surgeon: Bernarda Caffey, MD;  Location: [facility];  Service: Ophthalmology;  Laterality: Right;   MEMBRANE PEEL Left 07/20/2018   Procedure: Membrane Peel LEFT EYE;  Surgeon: Bernarda Caffey, MD;  Location: [facility];  Service: Ophthalmology;  Laterality: Left;   MEMBRANE PEEL Left 07/19/2019   Procedure: MEMBRANE PEEL;  Surgeon: Bernarda Caffey, MD;  Location: [facility];  Service: Ophthalmology;  Laterality: Left;   MITOMYCIN C APPLICATION Bilateral 0/04/5407   Procedure: Avastin Application;  Surgeon: Bernarda Caffey, MD;  Location: [facility];  Service: Ophthalmology;  Laterality: Bilateral;   PHOTOCOAGULATION WITH LASER Left 07/20/2018   Procedure: Photocoagulation With Laser LEFT EYE;  Surgeon: Bernarda Caffey, MD;  Location: [facility];  Service: Ophthalmology;  Laterality: Left;   PHOTOCOAGULATION WITH LASER Left 07/19/2019   Procedure: PHOTOCOAGULATION WITH LASER;  Surgeon: Bernarda Caffey, MD;  Location: [facility];  Service: Ophthalmology;  Laterality: Left;   RETINAL DETACHMENT  SURGERY Left 07/20/2018   Dr. Bernarda Caffey   SILICON OIL REMOVAL Left 07/19/2019   Procedure: 25g PARS PLANA VITRECTOMY WITH SILICON OIL REMOVAL;  Surgeon: Bernarda Caffey, MD;  Location: [facility];  Service: Ophthalmology;  Laterality: Left;   WISDOM TOOTH EXTRACTION      FAMILY HISTORY Family History  Problem Relation Age of Onset   Diabetes Mother    Aneurysm Mother 45   Seizures Mother    Diabetes Father    Cataracts Father    COPD Father    Heart attack Father    Heart disease Father    Healthy Sister    Healthy Daughter    Stroke Maternal Grandfather    Amblyopia Neg Hx    Blindness Neg Hx    Glaucoma Neg Hx    Macular degeneration Neg Hx    Retinal detachment Neg Hx    Strabismus Neg Hx    Retinitis pigmentosa Neg Hx     SOCIAL HISTORY Social History   Tobacco Use   Smoking status: Never Smoker   Smokeless tobacco: Never Used  Vaping Use   Vaping Use: Never used  Substance Use Topics   Alcohol use:  Not Currently    Comment: SOCIALLY   Drug use: Never         OPHTHALMIC EXAM:  Base Eye Exam    Visual Acuity (Snellen - Linear)      Right Left   Dist cc 20/30 20/150 -2   Dist ph cc NI 20/80 -1   Correction: Glasses       Tonometry (Tonopen, 8:47 AM)      Right Left   Pressure 19 30       Tonometry #2 (Tonopen, 8:48 AM)      Right Left   Pressure  34       Tonometry Comments   1 gtt brim and 1 gtt cosopt given at 845       Pupils      Dark Light Shape React APD   Right 4 3 Round Brisk None   Left 6 6 Round NR        Visual Fields      Left Right   Restrictions Partial outer superior temporal, inferior temporal deficiencies        Extraocular Movement      Right Left    Full, Ortho Full, Ortho       Neuro/Psych    Oriented x3: Yes   Mood/Affect: Normal       Dilation    Left eye: 1.0% Mydriacyl, 2.5% Phenylephrine @ 8:48 AM        Slit Lamp and Fundus Exam    Slit Lamp Exam      Right Left   Lids/Lashes Normal Normal   Conjunctiva/Sclera White and quiet White and quiet, sutures dissolving   Cornea trace Punctate epithelial erosions Well healed cataract wounds, clear   Anterior Chamber Deep and quiet Deep, 0.5+cell/pigment   Iris Round, moderate dilation, No NVI Round and dilated to 6.7mm, No NVI   Lens trace Nuclear sclerosis and cortical changes PCIOL in good position; 2+ PCO   Vitreous VH cleared centrally, Blood stained vitreous condensations and clots settled inferiorly - now white in color post vitrectomy, gas bubble gone, trace pigment       Fundus Exam      Right Left   Disc  Trace Pallor, Sharp rim   C/D Ratio 0.3 0.5   Macula  flat, Blunted  foveal reflex, ERM gone, scattered Microaneurysms   Vessels  Attenuated, Tortuous, Copper wiring greatest inferiorly   Periphery  Attached, peripapillary fibrosis regressing, good posterior PRP laser changes          IMAGING AND PROCEDURES  Imaging and Procedures for @TODAY @  OCT,  Retina - OU - Both Eyes       Right Eye Quality was borderline. Central Foveal Thickness: 212. Progression has been stable. Findings include no SRF, intraretinal hyper-reflective material, no IRF, normal foveal contour, vitreous traction, macular pucker, epiretinal membrane, preretinal fibrosis (stable improvement in vitreous opacities).   Left Eye Quality was borderline. Central Foveal Thickness: 392. Progression has improved. Findings include abnormal foveal contour, epiretinal membrane, no SRF, outer retinal atrophy, intraretinal fluid (Interval improvement in ERM, persistent nasal macular thickening with overlying pucker).   Notes *Images captured and stored on drive  Diagnosis / Impression:  PDR OU OD: mild preretinal fibrosis/ERM/pucker/stable improvement in vitreous opactities OS: Interval improvement in ERM, persistent nasal macular thickening with overlying pucker   Clinical management:  See below Abbreviations: NFP - Normal foveal profile. CME - cystoid macular edema. PED - pigment epithelial detachment. IRF - intraretinal fluid. SRF - subretinal fluid. EZ - ellipsoid zone. ERM - epiretinal membrane. ORA - outer retinal atrophy. ORT - outer retinal tubulation. SRHM - subretinal hyper-reflective material                  ASSESSMENT/PLAN:    ICD-10-CM   1. 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 Traction detachment of left retina  H33.42   3. Vitreous hemorrhage of both eyes ([place])  H43.13   4. Epiretinal membrane (ERM) of left eye  H35.372   5. Retinal edema  H35.81 OCT, Retina - OU - Both Eyes  6. Essential hypertension  I10   7. Hypertensive retinopathy of both eyes  H35.033   8. Combined forms of age-related cataract of right eye  H25.811   9. Pseudophakia  Z96.1     1-3. 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 (OS > OD)  - s/p PRP OS (11.26.19)  - s/p PRP OD (06.01.20)   -  lost to f/u following 11.26.19 appt due to complicated pregnancy, premature birth of baby w/ extended NICU stay and congenital heart defect, and post-partum health issues OS  - history of vision loss OS onset December 2019  - OS with chronic VH and TRD, likely present since Dec 2019  - s/p IVA OS #1 (06.23.20), #2 (07.02.20), #3 (04.02.21)  - s/p IVA OD #2 (11.30.20)  - s/p PPV/MP/EL/silicon oil + IVA OS for VH and TRD from DM1, 07.02.20             - doing well -- had cataract surgery OS with Dr. Eulas Post, 12.15.20 -- looks good, mild PCO             - retina attached under silicon oil -- persistent nasal ERM w/ edema OS  - mild SO emulsification OS  - now s/p PPV/SOR removal/Tissue Blue stain/MP/20% SF6 gas, IVA OS, 07.01.21             - doing well              - IOP 30,34 (OS)             - cont   PF 4x/day OD -- decrease to BID  Brimonidine QD OS -- increase to BID; add Cosopt BID OS                         PSO ung QID OS -- PRN/bedtime             - post op drop and positioning instructions reviewed   -f/u 2 weeks, POV, IOP check  OD  - s/p fill in PRP and IVA OD 07.02.2020 in operating room  - new onset diffuse VH OD since ~11.26.20   - s/p IVA OD #2 (11.30.20) -- VH stably improved today  - BCVA 20/20  4,5. Epiretinal membrane, left eye -- improved  - focal ERM with retinal thickening nasal macula   - s/p TissueBlue assisted membrane peel OS as above (7.1.21)   6,7. Hypertensive retinopathy OU  - there was concern for pregnancy related hypertension (I.e. Pre-eclampsia, Eclampsia, HELLP, etc) during pregnancy  - also during pregnancy (7.17.2019), had bilateral CRVO w/ CME (OD > OS) s/p PRP OS  - discussed importance of tight BP control  - monitor  8. Mixed cataract OD  - The symptoms of cataract, surgical options, and treatments and risks were discussed with patient.  - not visually significant    9. Pseudophakia OS  - s/p CE/IOL OS w/ Dr. Eulas Post,  12.15.2020  - beautiful surgery, doing well  - mild PCO  - monitor -- discussed possibility of YAG laser in office or capsulotomy at the time of oil removal OS    Ophthalmic Meds Ordered this visit:  No orders of the defined types were placed in this encounter.     Return in about 2 weeks (around 08/24/2019) for f/u IOP check.  There are no Patient Instructions on file for this visit.   Explained the diagnoses, plan, and follow up with the patient and they expressed understanding.  Patient expressed understanding of the importance of proper follow up care.  This document serves as a record of services personally performed by Gardiner Sleeper, MD, PhD. It was created on their behalf by San Jetty. Owens Shark, OA an ophthalmic technician. The creation of this record is the provider's dictation and/or activities during the visit.    Electronically signed by: San Jetty. Marguerita Merles 07.19.2021 11:52 PM  Gardiner Sleeper, M.D., Ph.D. Diseases & Surgery of the Retina and Vitreous [facility]  I have reviewed the above documentation for accuracy and completeness, and I agree with the above. Gardiner Sleeper, M.D., Ph.D. 08/12/19 11:52 PM   Abbreviations: M myopia (nearsighted); A astigmatism; H hyperopia (farsighted); P presbyopia; Mrx spectacle prescription;  CTL contact lenses; OD right eye; OS left eye; OU both eyes  XT exotropia; ET esotropia; PEK punctate epithelial keratitis; PEE punctate epithelial erosions; DES dry eye syndrome; MGD meibomian gland dysfunction; ATs artificial tears; PFAT's preservative free artificial tears; [place] nuclear sclerotic cataract; PSC posterior subcapsular cataract; ERM epi-retinal membrane; PVD posterior vitreous detachment; RD retinal detachment; DM diabetes mellitus; DR diabetic retinopathy; NPDR non-proliferative diabetic retinopathy; PDR proliferative diabetic retinopathy; CSME clinically significant macular edema; DME diabetic macular edema; dbh  dot blot hemorrhages; CWS cotton wool spot; POAG primary open angle glaucoma; C/D cup-to-disc ratio; HVF humphrey visual field; GVF goldmann visual field; OCT optical coherence tomography; IOP intraocular pressure; BRVO Branch retinal vein occlusion; CRVO central retinal vein occlusion; CRAO central retinal artery occlusion; BRAO branch retinal artery occlusion; RT retinal tear; SB scleral buckle; PPV pars plana vitrectomy; VH  Vitreous hemorrhage; PRP panretinal laser photocoagulation; IVK intravitreal kenalog; VMT vitreomacular traction; MH Macular hole;  NVD neovascularization of the disc; NVE neovascularization elsewhere; AREDS age related eye disease study; ARMD age related macular degeneration; POAG primary open angle glaucoma; EBMD epithelial/anterior basement membrane dystrophy; ACIOL anterior chamber intraocular lens; IOL intraocular lens; PCIOL posterior chamber intraocular lens; Phaco/IOL phacoemulsification with intraocular lens placement; [place] photorefractive keratectomy; LASIK laser assisted in situ keratomileusis; HTN hypertension; DM diabetes mellitus; COPD chronic obstructive pulmonary disease

## 2019-08-08 ENCOUNTER — Ambulatory Visit: Payer: BC Managed Care – PPO | Admitting: Endocrinology

## 2019-08-08 ENCOUNTER — Encounter: Payer: Self-pay | Admitting: Endocrinology

## 2019-08-08 ENCOUNTER — Other Ambulatory Visit: Payer: Self-pay

## 2019-08-08 VITALS — BP 192/100 | HR 97 | Ht 66.0 in | Wt 196.2 lb

## 2019-08-08 DIAGNOSIS — E10319 Type 1 diabetes mellitus with unspecified diabetic retinopathy without macular edema: Secondary | ICD-10-CM | POA: Diagnosis not present

## 2019-08-08 LAB — POCT GLYCOSYLATED HEMOGLOBIN (HGB A1C): Hemoglobin A1C: 8 % — AB (ref 4.0–5.6)

## 2019-08-08 MED ORDER — INSULIN ASPART 100 UNIT/ML ~~LOC~~ SOLN: SUBCUTANEOUS | 3 refills | Status: DC

## 2019-08-08 NOTE — Progress Notes (Signed)
Subjective:    Patient ID: Barbara Donovan, female    DOB: October 01, 1990, 29 y.o.   MRN: [id number]  HPI Pt returns for f/u of diabetes mellitus: DM type: 1 Dx'ed: 6295 Complications: PDR and stage 4 CRI.   Therapy: insulin since 2009, and pump rx since 2020.  DKA: last episode was 2015.  Severe hypoglycemia: never Pancreatitis: never Pancreatic imaging: never.  Other: she takes T-slim pump, but not continuous glucose monitor, due to cost.  Interval history:  She takes these settings:  basal rate of 2.4 unit/hr, 7 AM-9 PM, and 0.8 units/hr overnight.   No meal bolus.  correction bolus (which some people call "sensitivity," or "insulin sensitivity ratio," or just "isr") of 1 unit for each 30 by which your glucose exceeds 100.   TDD is 41 units (93% basal, 1% meal bolus, and 6% correction bolus.   Meter is downloaded today, and the printout is scanned into the record.  Glucose varies from 120-320.  There is no trend throughout the day, but not necessarily so.   She has mild hypoglycemia approx twice per week.  This usually happens fasting.    Past Medical History:  Diagnosis Date  . Asthma    as a child, no problems as an adult, no inhaler  . Cataract    NS OU  . Chronic hypertension during pregnancy, antepartum 08/19/2017  . Dehydration 01/28/2018  . Depression during pregnancy, antepartum 07/07/2017   6/20: Short trial of zoloft previously, reports didn't help much but also didn't give it a chance Discussed r/b/a SSRIs in pregnancy, agrees to try Zoloft again, rx sent No SI/HI/red flags  . Diabetes ([place])    TYPE I  . 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  Marland Kitchen HTN (hypertension)   . Hypertensive retinopathy    OU  . Hypokalemia 01/22/2018  . Hypomagnesemia 01/28/2018  . Intractable nausea and vomiting 01/22/2018  . Intrauterine growth restriction (IUGR) affecting care of mother 12/22/2017  . Morbid  obesity ([place])   . 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 Proteinuria due to type 1 diabetes mellitus ([place]) 11/02/2017   Baseline Pr: Cr 10.23  . Severe hyperemesis gravidarum 10/30/2017  . Type I diabetes mellitus ([place]) 07/07/2017   Current Diabetic Medications:  Insulin  [x]  Aspirin 81 mg daily after 12 weeks (? A2/B GDM)  Required Referrals for A1GDM or A2GDM: [x]  Diabetes Education and Testing Supplies [x]  Nutrition Cousult  For A2/B GDM or higher classes of DM [x]  Diabetes Education and Testing Supplies [x]  Nutrition Counsult [x]  Fetal ECHO after 20 weeks  [x]  Eye exam for retina evaluation - severe retinopathy 7/19  Base  . Ventricular septal defect (VSD) of fetus in singleton pregnancy, antepartum 09/30/2017   May go to newborn nursery per Dr. Lenard Simmer Echo prior to discharge    Past Surgical History:  Procedure Laterality Date  . [place] VITRECTOMY WITH 20 GAUGE MVR PORT FOR MACULAR HOLE Left 07/20/2018   Procedure: 25 GAUGE PARS PLANA VITRECTOMY LEFT EYE;  Surgeon: Bernarda Caffey, MD;  Location: [facility];  Service: Ophthalmology;  Laterality: Left;  . CESAREAN SECTION N/A 12/26/2017   Procedure: CESAREAN SECTION;  Surgeon: Osborne Oman, MD;  Location: [facility];  Service: Obstetrics;  Laterality: N/A;  . EYE EXAMINATION UNDER ANESTHESIA Right 07/20/2018   Procedure: Eye Exam Under Anesthesia RIGHT EYE;  Surgeon: Bernarda Caffey, MD;  Location: [facility];  Service: Ophthalmology;  Laterality: Right;  . EYE SURGERY Left 07/2018  . GAS INSERTION Left 07/19/2019   Procedure: INSERTION OF GAS;  Surgeon: Bernarda Caffey, MD;  Location: [facility];  Service: Ophthalmology;  Laterality: Left;  SF6  . INJECTION OF SILICONE OIL Left 05/20/9765   Procedure: Injection Of Silicone Oil LEFT EYE;  Surgeon: Bernarda Caffey, MD;  Location: [facility];  Service: Ophthalmology;  Laterality: Left;  . LASER PHOTO ABLATION Right 07/20/2018   Procedure: Laser Photo Ablation RIGHT EYE;  Surgeon: Bernarda Caffey, MD;   Location: [facility];  Service: Ophthalmology;  Laterality: Right;  . MEMBRANE PEEL Left 07/20/2018   Procedure: Membrane Peel LEFT EYE;  Surgeon: Bernarda Caffey, MD;  Location: [facility];  Service: Ophthalmology;  Laterality: Left;  . MEMBRANE PEEL Left 07/19/2019   Procedure: MEMBRANE PEEL;  Surgeon: Bernarda Caffey, MD;  Location: [facility];  Service: Ophthalmology;  Laterality: Left;  . MITOMYCIN C APPLICATION Bilateral 03/22/1935   Procedure: Avastin Application;  Surgeon: Bernarda Caffey, MD;  Location: [facility];  Service: Ophthalmology;  Laterality: Bilateral;  . PHOTOCOAGULATION WITH LASER Left 07/20/2018   Procedure: Photocoagulation With Laser LEFT EYE;  Surgeon: Bernarda Caffey, MD;  Location: [facility];  Service: Ophthalmology;  Laterality: Left;  . PHOTOCOAGULATION WITH LASER Left 07/19/2019   Procedure: PHOTOCOAGULATION WITH LASER;  Surgeon: Bernarda Caffey, MD;  Location: [facility];  Service: Ophthalmology;  Laterality: Left;  . RETINAL DETACHMENT SURGERY Left 07/20/2018   Dr. Bernarda Caffey  . SILICON OIL REMOVAL Left 07/19/2019   Procedure: 25g PARS PLANA VITRECTOMY WITH SILICON OIL REMOVAL;  Surgeon: Bernarda Caffey, MD;  Location: [facility];  Service: Ophthalmology;  Laterality: Left;  . WISDOM TOOTH EXTRACTION      Social History   Socioeconomic History  . Marital status: Single    Spouse name: Not on file  . Number of children: Not on file  . Years of education: Not on file  . Highest education level: Not on file  Occupational History  . Not on file  Tobacco Use  . Smoking status: Never Smoker  . Smokeless tobacco: Never Used  Vaping Use  . Vaping Use: Never used  Substance and Sexual Activity  . Alcohol use: Not Currently    Comment: SOCIALLY  . Drug use: Never  . Sexual activity: Not Currently    Birth control/protection: Pill  Other Topics Concern  . Not on file  Social History Narrative  . Not on file   Social Determinants of Health   Financial Resource Strain:   . Difficulty of Paying Living  Expenses:   Food Insecurity: No Food Insecurity  . Worried About Charity fundraiser in the Last Year: Never true  . Ran Out of Food in the Last Year: Never true  Transportation Needs: No Transportation Needs  . Lack of Transportation (Medical): No  . Lack of Transportation (Non-Medical): No  Physical Activity: Inactive  . Days of Exercise per Week: 0 days  . Minutes of Exercise per Session: 0 min  Stress: No Stress Concern Present  . Feeling of Stress : Not at all  Social Connections:   . Frequency of Communication with Friends and Family:   . Frequency of Social Gatherings with Friends and Family:   . Attends Religious Services:   . Active Member of Clubs or Organizations:   . Attends Archivist Meetings:   Marland Kitchen Marital Status:   Intimate Partner Violence:   . Fear of Current or Ex-Partner:   . Emotionally Abused:   .  Physically Abused:   . Sexually Abused:     Current Outpatient Medications on File Prior to Visit  Medication Sig Dispense Refill  . Blood Pressure Monitoring (OMRON 3 SERIES BP MONITOR) DEVI 1 each by Does not apply route daily. Take daily blood pressure. Please report back to Dr. Radford Pax in 2 wks. 1 Device 0  . carvedilol (COREG) 25 MG tablet Take 1 tablet (25 mg total) by mouth 2 (two) times daily. 180 tablet 3  . diltiazem (CARDIZEM CD) 240 MG 24 hr capsule Take 1 capsule (240 mg total) by mouth daily. 30 capsule 6  . furosemide (LASIX) 20 MG tablet Take 2 tablets (40 mg total) by mouth daily as needed for edema. (Patient taking differently: Take 20 mg by mouth daily as needed for edema. ) 30 tablet 1  . hydrALAZINE (APRESOLINE) 50 MG tablet Take 2 tablets (100 mg total) by mouth in the morning AND 1 tablet (50 mg total) daily at 12 noon AND 2 tablets (100 mg total) every evening. (Patient taking differently: Take 50 mg by mouth three times daily) 150 tablet 1  . Insulin Pen Needle (PEN NEEDLES) 32G X 5 MM MISC 1 Device by Does not apply route 4 (four) times  daily. 120 each 11  . norethindrone (MICRONOR) 0.35 MG tablet Take 1 tablet by mouth daily.    [name] VERIO test strip USE AS DIRECTED 5 TIMES DAILY 200 strip 2  . prednisoLONE acetate (PRED FORTE) 1 % ophthalmic suspension Place 1 drop into the left eye 4 (four) times daily. 15 mL 0   No current facility-administered medications on file prior to visit.    No Known Allergies  Family History  Problem Relation Age of Onset  . Diabetes Mother   . Aneurysm Mother 33  . Seizures Mother   . Diabetes Father   . Cataracts Father   . COPD Father   . Heart attack Father   . Heart disease Father   . Healthy Sister   . Healthy Daughter   . Stroke Maternal Grandfather   . Amblyopia Neg Hx   . Blindness Neg Hx   . Glaucoma Neg Hx   . Macular degeneration Neg Hx   . Retinal detachment Neg Hx   . Strabismus Neg Hx   . Retinitis pigmentosa Neg Hx     BP (!) 192/100   Pulse 97   Ht 5\' 6"  (1.676 m)   Wt 196 lb 3.2 oz (89 kg)   LMP 07/16/2019 (Exact Date)   SpO2 97%   BMI 31.67 kg/m    Review of Systems She has intermitt n/v.  Denies LOC.      Objective:   Physical Exam VITAL SIGNS:  See vs page GENERAL: no distress Pulses: dorsalis pedis intact bilat.   MSK: no deformity of the feet CV: no leg edema Skin:  no ulcer on the feet.  normal color and temp on the feet. Neuro: sensation is intact to touch on the feet.    Lab Results  Component Value Date   TSH 1.275 01/29/2018   Lab Results  Component Value Date   CREATININE 2.14 (H) 07/19/2019   BUN 27 (H) 07/19/2019   NA 136 07/19/2019   K 4.0 07/19/2019   CL 109 07/19/2019   CO2 18 (L) 07/19/2019        Assessment & Plan:  HTN: is noted today Type 1 DM, with stage 4 CRI Hypoglycemia, due to insulin: this limits aggressiveness of glycemic  control   Patient Instructions  Your blood pressure is high today.  Please see a primary care provider soon, to have it rechecked.   please take these pump settings: basal  rate of 2.5 unit/hr, 7 AM-9 PM, and 0.8 units/hr overnight.   No meal bolus.  correction bolus (which some people call "sensitivity," or "insulin sensitivity ratio," or just "isr") of 1 unit for each 30 by which your glucose exceeds 100.   On this type of pump schedule, you should eat meals on a regular schedule.  If a meal is missed or significantly delayed, your blood sugar could go low.   check your blood sugar 5 times a day: before the 3 meals, and at bedtime.  also check if you have symptoms of your blood sugar being too high or too low.  please keep a record of the readings and bring it to your next appointment here (or you can bring the meter itself).  You can write it on any piece of paper.  please call us sooner if your blood sugar goes below 70, or if you have a lot of readings over 200.   Please come back for a follow-up appointment in 2 months.

## 2019-08-08 NOTE — Patient Instructions (Addendum)
Your blood pressure is high today.  Please see a primary care provider soon, to have it rechecked.   please take these pump settings: basal rate of 2.5 unit/hr, 7 AM-9 PM, and 0.8 units/hr overnight.   No meal bolus.  correction bolus (which some people call "sensitivity," or "insulin sensitivity ratio," or just "isr") of 1 unit for each 30 by which your glucose exceeds 100.   On this type of pump schedule, you should eat meals on a regular schedule.  If a meal is missed or significantly delayed, your blood sugar could go low.   check your blood sugar 5 times a day: before the 3 meals, and at bedtime.  also check if you have symptoms of your blood sugar being too high or too low.  please keep a record of the readings and bring it to your next appointment here (or you can bring the meter itself).  You can write it on any piece of paper.  please call us sooner if your blood sugar goes below 70, or if you have a lot of readings over 200.   Please come back for a follow-up appointment in 2 months.

## 2019-08-10 ENCOUNTER — Ambulatory Visit (INDEPENDENT_AMBULATORY_CARE_PROVIDER_SITE_OTHER): Payer: BC Managed Care – PPO | Admitting: Ophthalmology

## 2019-08-10 ENCOUNTER — Encounter (INDEPENDENT_AMBULATORY_CARE_PROVIDER_SITE_OTHER): Payer: Self-pay | Admitting: Ophthalmology

## 2019-08-10 ENCOUNTER — Other Ambulatory Visit: Payer: Self-pay

## 2019-08-10 DIAGNOSIS — Z961 Presence of intraocular lens: Secondary | ICD-10-CM

## 2019-08-10 DIAGNOSIS — H35033 Hypertensive retinopathy, bilateral: Secondary | ICD-10-CM

## 2019-08-10 DIAGNOSIS — H3342 Traction detachment of retina, left eye: Secondary | ICD-10-CM

## 2019-08-10 DIAGNOSIS — H3581 Retinal edema: Secondary | ICD-10-CM | POA: Diagnosis not present

## 2019-08-10 DIAGNOSIS — E103513 Type 1 diabetes mellitus with proliferative diabetic retinopathy with macular edema, bilateral: Secondary | ICD-10-CM

## 2019-08-10 DIAGNOSIS — I10 Essential (primary) hypertension: Secondary | ICD-10-CM

## 2019-08-10 DIAGNOSIS — H25811 Combined forms of age-related cataract, right eye: Secondary | ICD-10-CM

## 2019-08-10 DIAGNOSIS — H35372 Puckering of macula, left eye: Secondary | ICD-10-CM

## 2019-08-10 DIAGNOSIS — H4313 Vitreous hemorrhage, bilateral: Secondary | ICD-10-CM

## 2019-08-12 ENCOUNTER — Encounter (INDEPENDENT_AMBULATORY_CARE_PROVIDER_SITE_OTHER): Payer: Self-pay | Admitting: Ophthalmology

## 2019-08-14 ENCOUNTER — Encounter (HOSPITAL_COMMUNITY): Payer: Self-pay

## 2019-08-14 ENCOUNTER — Emergency Department (HOSPITAL_COMMUNITY)
Admission: EM | Admit: 2019-08-14 | Discharge: 2019-08-14 | Disposition: A | Discharge status: Home / Self Care | Payer: BC Managed Care – PPO | Attending: Emergency Medicine | Admitting: Emergency Medicine

## 2019-08-14 ENCOUNTER — Ambulatory Visit (INDEPENDENT_AMBULATORY_CARE_PROVIDER_SITE_OTHER)
Admission: EM | Admit: 2019-08-14 | Discharge: 2019-08-14 | Disposition: A | Discharge status: Home / Self Care | Payer: BC Managed Care – PPO | Source: Home / Self Care

## 2019-08-14 ENCOUNTER — Encounter (HOSPITAL_COMMUNITY): Payer: Self-pay | Admitting: Emergency Medicine

## 2019-08-14 ENCOUNTER — Other Ambulatory Visit: Payer: Self-pay

## 2019-08-14 DIAGNOSIS — H35033 Hypertensive retinopathy, bilateral: Secondary | ICD-10-CM | POA: Insufficient documentation

## 2019-08-14 DIAGNOSIS — R197 Diarrhea, unspecified: Secondary | ICD-10-CM

## 2019-08-14 DIAGNOSIS — Z3202 Encounter for pregnancy test, result negative: Secondary | ICD-10-CM | POA: Diagnosis not present

## 2019-08-14 DIAGNOSIS — R112 Nausea with vomiting, unspecified: Secondary | ICD-10-CM | POA: Insufficient documentation

## 2019-08-14 DIAGNOSIS — K5900 Constipation, unspecified: Secondary | ICD-10-CM

## 2019-08-14 DIAGNOSIS — J45909 Unspecified asthma, uncomplicated: Secondary | ICD-10-CM | POA: Diagnosis not present

## 2019-08-14 DIAGNOSIS — E109 Type 1 diabetes mellitus without complications: Secondary | ICD-10-CM | POA: Diagnosis not present

## 2019-08-14 DIAGNOSIS — R1013 Epigastric pain: Secondary | ICD-10-CM | POA: Diagnosis not present

## 2019-08-14 DIAGNOSIS — E1121 Type 2 diabetes mellitus with diabetic nephropathy: Secondary | ICD-10-CM | POA: Diagnosis not present

## 2019-08-14 DIAGNOSIS — R110 Nausea: Secondary | ICD-10-CM | POA: Diagnosis not present

## 2019-08-14 DIAGNOSIS — R1084 Generalized abdominal pain: Secondary | ICD-10-CM | POA: Diagnosis not present

## 2019-08-14 DIAGNOSIS — E11319 Type 2 diabetes mellitus with unspecified diabetic retinopathy without macular edema: Secondary | ICD-10-CM | POA: Insufficient documentation

## 2019-08-14 DIAGNOSIS — Z79899 Other long term (current) drug therapy: Secondary | ICD-10-CM | POA: Insufficient documentation

## 2019-08-14 DIAGNOSIS — Z794 Long term (current) use of insulin: Secondary | ICD-10-CM

## 2019-08-14 DIAGNOSIS — R7989 Other specified abnormal findings of blood chemistry: Secondary | ICD-10-CM

## 2019-08-14 DIAGNOSIS — R1110 Vomiting, unspecified: Secondary | ICD-10-CM | POA: Diagnosis present

## 2019-08-14 DIAGNOSIS — R944 Abnormal results of kidney function studies: Secondary | ICD-10-CM

## 2019-08-14 LAB — COMPREHENSIVE METABOLIC PANEL
ALT: 25 U/L (ref 0–44)
AST: 38 U/L (ref 15–41)
Albumin: 2.4 g/dL — ABNORMAL LOW (ref 3.5–5.0)
Alkaline Phosphatase: 78 U/L (ref 38–126)
Anion gap: 13 (ref 5–15)
BUN: 22 mg/dL — ABNORMAL HIGH (ref 6–20)
CO2: 23 mmol/L (ref 22–32)
Calcium: 8.6 mg/dL — ABNORMAL LOW (ref 8.9–10.3)
Chloride: 100 mmol/L (ref 98–111)
Creatinine, Ser: 2.28 mg/dL — ABNORMAL HIGH (ref 0.44–1.00)
GFR calc Af Amer: 33 mL/min — ABNORMAL LOW (ref >60)
GFR calc non Af Amer: 28 mL/min — ABNORMAL LOW (ref >60)
Glucose, Bld: 291 mg/dL — ABNORMAL HIGH (ref 70–99)
Potassium: 3.3 mmol/L — ABNORMAL LOW (ref 3.5–5.1)
Sodium: 136 mmol/L (ref 135–145)
Total Bilirubin: 0.5 mg/dL (ref 0.3–1.2)
Total Protein: 7.1 g/dL (ref 6.5–8.1)

## 2019-08-14 LAB — CBC
HCT: 41.8 % (ref 36.0–46.0)
Hemoglobin: 13.6 g/dL (ref 12.0–15.0)
MCH: 26.7 pg (ref 26.0–34.0)
MCHC: 32.5 g/dL (ref 30.0–36.0)
MCV: 82 fL (ref 80.0–100.0)
Platelets: 531 10*3/uL — ABNORMAL HIGH (ref 150–400)
RBC: 5.1 MIL/uL (ref 3.87–5.11)
RDW: 13.1 % (ref 11.5–15.5)
WBC: 10.6 10*3/uL — ABNORMAL HIGH (ref 4.0–10.5)
nRBC: 0 % (ref 0.0–0.2)

## 2019-08-14 LAB — CBG MONITORING, ED
Glucose-Capillary: 366 mg/dL — ABNORMAL HIGH (ref 70–99)
Glucose-Capillary: 320 mg/dL — ABNORMAL HIGH (ref 70–99)

## 2019-08-14 LAB — POCT URINALYSIS DIP (DEVICE)
Bilirubin Urine: NEGATIVE
Glucose, UA: 500 mg/dL — AB
Ketones, ur: 15 mg/dL — AB
Leukocytes,Ua: NEGATIVE
Nitrite: NEGATIVE
Protein, ur: >=300 mg/dL — AB
Specific Gravity, Urine: 1.02 (ref 1.005–1.030)
Urobilinogen, UA: 0.2 mg/dL (ref 0.0–1.0)
pH: 6.5 (ref 5.0–8.0)

## 2019-08-14 LAB — I-STAT BETA HCG BLOOD, ED (MC, WL, AP ONLY): I-stat hCG, quantitative: <5 m[IU]/mL (ref <5)

## 2019-08-14 LAB — LIPASE, BLOOD: Lipase: 28 U/L (ref 11–51)

## 2019-08-14 LAB — POC URINE PREG, ED: Preg Test, Ur: NEGATIVE

## 2019-08-14 MED ORDER — SODIUM CHLORIDE 0.9 % IV BOLUS
1000.0000 mL | Freq: Once | INTRAVENOUS | Status: AC
  Administered 2019-08-14: 1000 mL via INTRAVENOUS

## 2019-08-14 MED ORDER — ONDANSETRON HCL 4 MG/2ML IJ SOLN
4.0000 mg | Freq: Once | INTRAMUSCULAR | Status: AC | PRN
  Administered 2019-08-14: 4 mg via INTRAVENOUS
  Filled 2019-08-14: qty 2

## 2019-08-14 MED ORDER — PROMETHAZINE HCL 25 MG PO TABS
25.0000 mg | ORAL_TABLET | Freq: Four times a day (QID) | ORAL | 0 refills | Status: DC | PRN
Start: 2019-08-14 — End: 2019-11-28

## 2019-08-14 MED ORDER — ONDANSETRON 8 MG PO TBDP: 8.0000 mg | ORAL_TABLET | Freq: Three times a day (TID) | ORAL | 0 refills | Status: DC | PRN

## 2019-08-14 MED ORDER — ONDANSETRON HCL 4 MG/2ML IJ SOLN
INTRAMUSCULAR | Status: AC
  Filled 2019-08-14: qty 2

## 2019-08-14 MED ORDER — ONDANSETRON HCL 4 MG/2ML IJ SOLN
4.0000 mg | Freq: Once | INTRAMUSCULAR | Status: AC
  Administered 2019-08-14: 4 mg via INTRAMUSCULAR

## 2019-08-14 MED ORDER — PROMETHAZINE HCL 25 MG/ML IJ SOLN
25.0000 mg | Freq: Once | INTRAMUSCULAR | Status: AC
  Administered 2019-08-14: 25 mg via INTRAVENOUS
  Filled 2019-08-14: qty 1

## 2019-08-14 NOTE — ED Triage Notes (Signed)
Pt presents to UC for n/v/d since Sunday. Pt states she took a laxative for constipation on Sunday and has had symptoms since. Last emesis this morning. Pt states she has taken promethizine, and vitamin b6 with out relief. Pt also noted to be on period.

## 2019-08-14 NOTE — ED Provider Notes (Signed)
[facility]   MRN: [id number] DOB: [birth year]  Subjective:   Barbara Donovan is a 29 y.o. female presenting for 3-day history of persistent nausea with vomiting, diarrhea.  Patient is a type I insulin diabetic.  Forgot to take her insulin this morning.  States that she has had difficulty in the past with constipation which is diet related per patient.  She has been trying to eat better in the past few days but has a difficult time with nausea, vomiting gagging.  She has been able to hold fluids down.  Has used over-the-counter medications with some relief.  Denies fever, chest pain, shortness of breath, belly pain, bloody stools, dysuria, hematuria.  Patient has had decline in GFR function this past year.  Last GFR on 07/19/2019 was 35.  She gets regular follow-up with her PCP, has an appointment in 2 days.  No current facility-administered medications for this encounter.  Current Outpatient Medications:  .  Blood Pressure Monitoring (OMRON 3 SERIES BP MONITOR) DEVI, 1 each by Does not apply route daily. Take daily blood pressure. Please report back to Dr. Radford Pax in 2 wks., Disp: 1 Device, Rfl: 0 .  carvedilol (COREG) 25 MG tablet, Take 1 tablet (25 mg total) by mouth 2 (two) times daily., Disp: 180 tablet, Rfl: 3 .  diltiazem (CARDIZEM CD) 240 MG 24 hr capsule, Take 1 capsule (240 mg total) by mouth daily., Disp: 30 capsule, Rfl: 6 .  furosemide (LASIX) 20 MG tablet, Take 2 tablets (40 mg total) by mouth daily as needed for edema. (Patient taking differently: Take 20 mg by mouth daily as needed for edema. ), Disp: 30 tablet, Rfl: 1 .  hydrALAZINE (APRESOLINE) 50 MG tablet, Take 2 tablets (100 mg total) by mouth in the morning AND 1 tablet (50 mg total) daily at 12 noon AND 2 tablets (100 mg total) every evening. (Patient taking differently: Take 50 mg by mouth three times daily), Disp: 150 tablet, Rfl: 1 .  insulin aspart (NOVOLOG) 100 UNIT/ML injection, FOR USE IN PUMP, TOTAL OF 80  UNITS PER DAY., Disp: 80 mL, Rfl: 3 .  Insulin Pen Needle (PEN NEEDLES) 32G X 5 MM MISC, 1 Device by Does not apply route 4 (four) times daily., Disp: 120 each, Rfl: 11 .  norethindrone (MICRONOR) 0.35 MG tablet, Take 1 tablet by mouth daily., Disp: , Rfl:  .  ONETOUCH VERIO test strip, USE AS DIRECTED 5 TIMES DAILY, Disp: 200 strip, Rfl: 2 .  prednisoLONE acetate (PRED FORTE) 1 % ophthalmic suspension, Place 1 drop into the left eye 4 (four) times daily., Disp: 15 mL, Rfl: 0   No Known Allergies  Past Medical History:  Diagnosis Date  . Asthma    as a child, no problems as an adult, no inhaler  . Cataract    NS OU  . Chronic hypertension during pregnancy, antepartum 08/19/2017  . Dehydration 01/28/2018  . Depression during pregnancy, antepartum 07/07/2017   6/20: Short trial of zoloft previously, reports didn't help much but also didn't give it a chance Discussed r/b/a SSRIs in pregnancy, agrees to try Zoloft again, rx sent No SI/HI/red flags  . Diabetes ([place])    TYPE I  . 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  Marland Kitchen HTN (hypertension)   . Hypertensive retinopathy    OU  . Hypokalemia 01/22/2018  . Hypomagnesemia 01/28/2018  . Intractable nausea and vomiting 01/22/2018  . Intrauterine growth restriction (  IUGR) affecting care of mother 12/22/2017  . Morbid obesity ([place])   . 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 Proteinuria due to type 1 diabetes mellitus ([place]) 11/02/2017   Baseline Pr: Cr 10.23  . Severe hyperemesis gravidarum 10/30/2017  . Type I diabetes mellitus ([place]) 07/07/2017   Current Diabetic Medications:  Insulin  [x]  Aspirin 81 mg daily after 12 weeks (? A2/B GDM)  Required Referrals for A1GDM or A2GDM: [x]  Diabetes Education and Testing Supplies [x]  Nutrition Cousult  For A2/B GDM or higher classes of DM [x]  Diabetes Education and Testing Supplies [x]  Nutrition Counsult [x]  Fetal ECHO after 20  weeks  [x]  Eye exam for retina evaluation - severe retinopathy 7/19  Base  . Ventricular septal defect (VSD) of fetus in singleton pregnancy, antepartum 09/30/2017   May go to newborn nursery per Dr. Lenard Simmer Echo prior to discharge     Past Surgical History:  Procedure Laterality Date  . [place] VITRECTOMY WITH 20 GAUGE MVR PORT FOR MACULAR HOLE Left 07/20/2018   Procedure: 25 GAUGE PARS PLANA VITRECTOMY LEFT EYE;  Surgeon: Bernarda Caffey, MD;  Location: [facility];  Service: Ophthalmology;  Laterality: Left;  . CESAREAN SECTION N/A 12/26/2017   Procedure: CESAREAN SECTION;  Surgeon: Osborne Oman, MD;  Location: [facility];  Service: Obstetrics;  Laterality: N/A;  . EYE EXAMINATION UNDER ANESTHESIA Right 07/20/2018   Procedure: Eye Exam Under Anesthesia RIGHT EYE;  Surgeon: Bernarda Caffey, MD;  Location: [facility];  Service: Ophthalmology;  Laterality: Right;  . EYE SURGERY Left 07/2018  . GAS INSERTION Left 07/19/2019   Procedure: INSERTION OF GAS;  Surgeon: Bernarda Caffey, MD;  Location: [facility];  Service: Ophthalmology;  Laterality: Left;  SF6  . INJECTION OF SILICONE OIL Left 02/22/4008   Procedure: Injection Of Silicone Oil LEFT EYE;  Surgeon: Bernarda Caffey, MD;  Location: [facility];  Service: Ophthalmology;  Laterality: Left;  . LASER PHOTO ABLATION Right 07/20/2018   Procedure: Laser Photo Ablation RIGHT EYE;  Surgeon: Bernarda Caffey, MD;  Location: [facility];  Service: Ophthalmology;  Laterality: Right;  . MEMBRANE PEEL Left 07/20/2018   Procedure: Membrane Peel LEFT EYE;  Surgeon: Bernarda Caffey, MD;  Location: [facility];  Service: Ophthalmology;  Laterality: Left;  . MEMBRANE PEEL Left 07/19/2019   Procedure: MEMBRANE PEEL;  Surgeon: Bernarda Caffey, MD;  Location: [facility];  Service: Ophthalmology;  Laterality: Left;  . MITOMYCIN C APPLICATION Bilateral 02/24/2534   Procedure: Avastin Application;  Surgeon: Bernarda Caffey, MD;  Location: [facility];  Service: Ophthalmology;  Laterality: Bilateral;  .  PHOTOCOAGULATION WITH LASER Left 07/20/2018   Procedure: Photocoagulation With Laser LEFT EYE;  Surgeon: Bernarda Caffey, MD;  Location: [facility];  Service: Ophthalmology;  Laterality: Left;  . PHOTOCOAGULATION WITH LASER Left 07/19/2019   Procedure: PHOTOCOAGULATION WITH LASER;  Surgeon: Bernarda Caffey, MD;  Location: [facility];  Service: Ophthalmology;  Laterality: Left;  . RETINAL DETACHMENT SURGERY Left 07/20/2018   Dr. Bernarda Caffey  . SILICON OIL REMOVAL Left 07/19/2019   Procedure: 25g PARS PLANA VITRECTOMY WITH SILICON OIL REMOVAL;  Surgeon: Bernarda Caffey, MD;  Location: [facility];  Service: Ophthalmology;  Laterality: Left;  . WISDOM TOOTH EXTRACTION      Family History  Problem Relation Age of Onset  . Diabetes Mother   . Aneurysm Mother 47  . Seizures Mother   . Diabetes Father   . Cataracts Father   . COPD Father   . Heart attack Father   .  Heart disease Father   . Healthy Sister   . Healthy Daughter   . Stroke Maternal Grandfather   . Amblyopia Neg Hx   . Blindness Neg Hx   . Glaucoma Neg Hx   . Macular degeneration Neg Hx   . Retinal detachment Neg Hx   . Strabismus Neg Hx   . Retinitis pigmentosa Neg Hx     Social History   Tobacco Use  . Smoking status: Never Smoker  . Smokeless tobacco: Never Used  Vaping Use  . Vaping Use: Never used  Substance Use Topics  . Alcohol use: Not Currently    Comment: SOCIALLY  . Drug use: Never    ROS   Objective:   Vitals: BP (!) 136/87 (BP Location: Right Arm)   Pulse 89   Temp 98.5 F (36.9 C) (Oral)   Resp 16   LMP 08/14/2019 (Exact Date)   SpO2 100%   Physical Exam Constitutional:      General: She is not in acute distress.    Appearance: Normal appearance. She is well-developed and normal weight. She is not ill-appearing, toxic-appearing or diaphoretic.  HENT:     Head: Normocephalic and atraumatic.     Right Ear: External ear normal.     Left Ear: External ear normal.     Nose: Nose normal.     Mouth/Throat:      Mouth: Mucous membranes are moist.     Pharynx: Oropharynx is clear.  Eyes:     General: No scleral icterus.    Extraocular Movements: Extraocular movements intact.     Pupils: Pupils are equal, round, and reactive to light.  Cardiovascular:     Rate and Rhythm: Normal rate and regular rhythm.     Pulses: Normal pulses.     Heart sounds: Normal heart sounds. No murmur heard.  No friction rub. No gallop.   Pulmonary:     Effort: Pulmonary effort is normal. No respiratory distress.     Breath sounds: Normal breath sounds. No stridor. No wheezing, rhonchi or rales.  Abdominal:     General: Bowel sounds are normal. There is no distension.     Palpations: Abdomen is soft. There is no mass.     Tenderness: There is generalized abdominal tenderness and tenderness in the epigastric area and periumbilical area. There is no right CVA tenderness, left CVA tenderness, guarding or rebound.  Skin:    General: Skin is warm and dry.     Coloration: Skin is not pale.     Findings: No rash.  Neurological:     General: No focal deficit present.     Mental Status: She is alert and oriented to person, place, and time.  Psychiatric:        Mood and Affect: Mood normal.        Behavior: Behavior normal.        Thought Content: Thought content normal.        Judgment: Judgment normal.     Results for orders placed or performed during the hospital encounter of 08/14/19 (from the past 24 hour(s))  POC CBG monitoring     Status: Abnormal   Collection Time: 08/14/19 10:20 AM  Result Value Ref Range   Glucose-Capillary 366 (H) 70 - 99 mg/dL   Comment 1 Notify RN    Comment 2 Document in Chart   POCT urinalysis dip (device)     Status: Abnormal   Collection Time: 08/14/19 10:51 AM  Result Value Ref  Range   Glucose, UA 500 (A) NEGATIVE mg/dL   Bilirubin Urine NEGATIVE NEGATIVE   Ketones, ur 15 (A) NEGATIVE mg/dL   Specific Gravity, Urine 1.020 1.005 - 1.030   Hgb urine dipstick LARGE (A) NEGATIVE    pH 6.5 5.0 - 8.0   Protein, ur >=300 (A) NEGATIVE mg/dL   Urobilinogen, UA 0.2 0.0 - 1.0 mg/dL   Nitrite NEGATIVE NEGATIVE   Leukocytes,Ua NEGATIVE NEGATIVE  POC urine pregnancy     Status: None   Collection Time: 08/14/19 10:58 AM  Result Value Ref Range   Preg Test, Ur NEGATIVE NEGATIVE    Assessment and Plan :   PDMP not reviewed this encounter.  1. Nausea vomiting and diarrhea   2. Constipation, unspecified constipation type   3. Type 1 diabetes mellitus without complication (HCC)   4. Decreased GFR     Suspect combination of dehydration, adverse effects of laxative use.  Recommended supportive care, strict ER precautions.  Use Zofran for nausea and vomiting.  IM Zofran given in clinic.  Patient has very close follow-up with her PCP, recommended keeping this appointment.  Emphasized need for medical and dietary compliance for her type 1 diabetes. Counseled patient on potential for adverse effects with medications prescribed/recommended today, ER and return-to-clinic precautions discussed, patient verbalized understanding.    Jaynee Eagles, PA-C 08/14/19 1139

## 2019-08-14 NOTE — ED Provider Notes (Signed)
[facility] DEPT Provider Note   CSN: [id number] Arrival date & time: 08/14/19  1352     History Chief Complaint  Patient presents with  . Emesis    Barbara Donovan is a 29 y.o. female.  HPI      29 year old female, with a PMH of insulin-dependent diabetes, HTN, presents with nausea, vomiting and diarrhea.  She states for the last 3 days she has had vomiting and diarrhea.  She states of diarrhea did resolve today.  She states approximately 7 episodes of emesis today.  She denies any hematemesis, hematochezia, melena.  Patient does note a history of DKA but states this does not feel the same as when she has had DKA.  She notes diffuse cramping abdominal pain and denies any aggravating or alleviating factors.  Patient seen and evaluated at urgent care and sent to the ED for further evaluation.  She denies any marijuana, EtOH.    Past Medical History:  Diagnosis Date  . Asthma    as a child, no problems as an adult, no inhaler  . Cataract    NS OU  . Chronic hypertension during pregnancy, antepartum 08/19/2017  . Dehydration 01/28/2018  . Depression during pregnancy, antepartum 07/07/2017   6/20: Short trial of zoloft previously, reports didn't help much but also didn't give it a chance Discussed r/b/a SSRIs in pregnancy, agrees to try Zoloft again, rx sent No SI/HI/red flags  . Diabetes ([place])    TYPE I  . 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  Marland Kitchen HTN (hypertension)   . Hypertensive retinopathy    OU  . Hypokalemia 01/22/2018  . Hypomagnesemia 01/28/2018  . Intractable nausea and vomiting 01/22/2018  . Intrauterine growth restriction (IUGR) affecting care of mother 12/22/2017  . Morbid obesity ([place])   . 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 Proteinuria due to type 1 diabetes mellitus ([place]) 11/02/2017   Baseline Pr: Cr 10.23  . Severe hyperemesis gravidarum  10/30/2017  . Type I diabetes mellitus ([place]) 07/07/2017   Current Diabetic Medications:  Insulin  [x]  Aspirin 81 mg daily after 12 weeks (? A2/B GDM)  Required Referrals for A1GDM or A2GDM: [x]  Diabetes Education and Testing Supplies [x]  Nutrition Cousult  For A2/B GDM or higher classes of DM [x]  Diabetes Education and Testing Supplies [x]  Nutrition Counsult [x]  Fetal ECHO after 20 weeks  [x]  Eye exam for retina evaluation - severe retinopathy 7/19  Base  . Ventricular septal defect (VSD) of fetus in singleton pregnancy, antepartum 09/30/2017   May go to newborn nursery per Dr. Lenard Simmer Echo prior to discharge    Patient Active Problem List   Diagnosis Date Noted  . SOB (shortness of breath) 03/27/2018  . Hypomagnesemia 01/28/2018  . Dehydration 01/28/2018  . Intractable nausea and vomiting 01/22/2018  . Hypokalemia 01/22/2018  . 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 Proteinuria due to type 1 diabetes mellitus ([place]) 11/02/2017  . Chronic hypertension 08/21/2017  . Type I diabetes mellitus ([place]) 07/07/2017  . Diabetic retinopathy ([place]) 06/09/2017  . Acute depression 11/02/2014  . Asthma 10/30/2013    Past Surgical History:  Procedure Laterality Date  . [place] VITRECTOMY WITH 20 GAUGE MVR PORT FOR MACULAR HOLE Left 07/20/2018   Procedure: 25 GAUGE PARS PLANA VITRECTOMY LEFT EYE;  Surgeon: Bernarda Caffey, MD;  Location: [facility];  Service: Ophthalmology;  Laterality: Left;  . CESAREAN SECTION N/A 12/26/2017   Procedure:  CESAREAN SECTION;  Surgeon: Osborne Oman, MD;  Location: [facility];  Service: Obstetrics;  Laterality: N/A;  . EYE EXAMINATION UNDER ANESTHESIA Right 07/20/2018   Procedure: Eye Exam Under Anesthesia RIGHT EYE;  Surgeon: Bernarda Caffey, MD;  Location: [facility];  Service: Ophthalmology;  Laterality: Right;  . EYE SURGERY Left 07/2018  . GAS INSERTION Left 07/19/2019   Procedure: INSERTION OF GAS;  Surgeon: Bernarda Caffey, MD;  Location: [facility];  Service:  Ophthalmology;  Laterality: Left;  SF6  . INJECTION OF SILICONE OIL Left 07/23/1023   Procedure: Injection Of Silicone Oil LEFT EYE;  Surgeon: Bernarda Caffey, MD;  Location: [facility];  Service: Ophthalmology;  Laterality: Left;  . LASER PHOTO ABLATION Right 07/20/2018   Procedure: Laser Photo Ablation RIGHT EYE;  Surgeon: Bernarda Caffey, MD;  Location: [facility];  Service: Ophthalmology;  Laterality: Right;  . MEMBRANE PEEL Left 07/20/2018   Procedure: Membrane Peel LEFT EYE;  Surgeon: Bernarda Caffey, MD;  Location: [facility];  Service: Ophthalmology;  Laterality: Left;  . MEMBRANE PEEL Left 07/19/2019   Procedure: MEMBRANE PEEL;  Surgeon: Bernarda Caffey, MD;  Location: [facility];  Service: Ophthalmology;  Laterality: Left;  . MITOMYCIN C APPLICATION Bilateral 08/22/2776   Procedure: Avastin Application;  Surgeon: Bernarda Caffey, MD;  Location: [facility] [place];  Service: Ophthalmology;  Laterality: Bilateral;  . PHOTOCOAGULATION WITH LASER Left 07/20/2018   Procedure: Photocoagulation With Laser LEFT EYE;  Surgeon: Bernarda Caffey, MD;  Location: [place] [facility];  Service: Ophthalmology;  Laterality: Left;  . PHOTOCOAGULATION WITH LASER Left 07/19/2019   Procedure: PHOTOCOAGULATION WITH LASER;  Surgeon: Bernarda Caffey, MD;  Location: [facility];  Service: Ophthalmology;  Laterality: Left;  . RETINAL DETACHMENT SURGERY Left 07/20/2018   Dr. Bernarda Caffey  . SILICON OIL REMOVAL Left 07/19/2019   Procedure: 25g PARS PLANA VITRECTOMY WITH SILICON OIL REMOVAL;  Surgeon: Bernarda Caffey, MD;  Location: [facility];  Service: Ophthalmology;  Laterality: Left;  . WISDOM TOOTH EXTRACTION       OB History    Gravida  1   Para  1   Term      Preterm  1   AB      Living  1     SAB      TAB      Ectopic      Multiple  0   Live Births  1           Family History  Problem Relation Age of Onset  . Diabetes Mother   . Aneurysm Mother 12  . Seizures Mother   . Diabetes Father   . Cataracts Father   . COPD Father   . Heart attack Father   . Heart  disease Father   . Healthy Sister   . Healthy Daughter   . Stroke Maternal Grandfather   . Amblyopia Neg Hx   . Blindness Neg Hx   . Glaucoma Neg Hx   . Macular degeneration Neg Hx   . Retinal detachment Neg Hx   . Strabismus Neg Hx   . Retinitis pigmentosa Neg Hx     Social History   Tobacco Use  . Smoking status: Never Smoker  . Smokeless tobacco: Never Used  Vaping Use  . Vaping Use: Never used  Substance Use Topics  . Alcohol use: Not Currently    Comment: SOCIALLY  . Drug use: Never    Home Medications Prior to Admission medications   Medication Sig Start Date End Date Taking?  Authorizing Provider  Blood Pressure Monitoring (OMRON 3 SERIES BP MONITOR) DEVI 1 each by Does not apply route daily. Take daily blood pressure. Please report back to Dr. Radford Pax in 2 wks. 08/08/18   Sueanne Margarita, MD  carvedilol (COREG) 25 MG tablet Take 1 tablet (25 mg total) by mouth 2 (two) times daily. 03/07/19   Imogene Burn, PA-C  diltiazem (CARDIZEM CD) 240 MG 24 hr capsule Take 1 capsule (240 mg total) by mouth daily. 03/08/19   Imogene Burn, PA-C  furosemide (LASIX) 20 MG tablet Take 2 tablets (40 mg total) by mouth daily as needed for edema. Patient taking differently: Take 20 mg by mouth daily as needed for edema.  04/19/19   Skeet Latch, MD  hydrALAZINE (APRESOLINE) 50 MG tablet Take 2 tablets (100 mg total) by mouth in the morning AND 1 tablet (50 mg total) daily at 12 noon AND 2 tablets (100 mg total) every evening. Patient taking differently: Take 50 mg by mouth three times daily 06/21/19   Skeet Latch, MD  insulin aspart (NOVOLOG) 100 UNIT/ML injection FOR USE IN PUMP, TOTAL OF 80 UNITS PER DAY. 08/08/19   Renato Shin, MD  Insulin Pen Needle (PEN NEEDLES) 32G X 5 MM MISC 1 Device by Does not apply route 4 (four) times daily. 03/23/18   Renato Shin, MD  norethindrone (MICRONOR) 0.35 MG tablet Take 1 tablet by mouth daily.    [provider]  ondansetron  (ZOFRAN-ODT) 8 MG disintegrating tablet Take 1 tablet (8 mg total) by mouth every 8 (eight) hours as needed for nausea or vomiting. 08/14/19   Jaynee Eagles, PA-C  [facility] VERIO test strip USE AS DIRECTED 5 TIMES DAILY 03/09/19   Renato Shin, MD  prednisoLONE acetate (PRED FORTE) 1 % ophthalmic suspension Place 1 drop into the left eye 4 (four) times daily. 07/26/19   Bernarda Caffey, MD    Allergies    Patient has no known allergies.  Review of Systems   Review of Systems  Constitutional: Negative for chills and fever.  HENT: Negative for rhinorrhea and sore throat.   Eyes: Negative for visual disturbance.  Respiratory: Negative for cough and shortness of breath.   Cardiovascular: Negative for chest pain and leg swelling.  Gastrointestinal: Positive for abdominal pain, diarrhea, nausea and vomiting.  Genitourinary: Negative for dysuria, frequency and urgency.  Musculoskeletal: Negative for joint swelling and neck pain.  Skin: Negative for rash and wound.  Neurological: Negative for syncope and numbness.  All other systems reviewed and are negative.   Physical Exam Updated Vital Signs BP (!) 174/104 (BP Location: Left Arm)   Pulse 93   Temp 99.1 F (37.3 C) (Oral)   Resp 18   LMP 08/14/2019 (Exact Date)   SpO2 100%   Physical Exam Vitals and nursing note reviewed.  Constitutional:      Comments: Actively vomiting  HENT:     Head: Normocephalic and atraumatic.  Eyes:     Conjunctiva/sclera: Conjunctivae normal.  Cardiovascular:     Rate and Rhythm: Normal rate and regular rhythm.     Heart sounds: Normal heart sounds. No murmur heard.   Pulmonary:     Effort: Pulmonary effort is normal. No respiratory distress.     Breath sounds: Normal breath sounds. No wheezing or rales.  Abdominal:     General: Bowel sounds are normal. There is no distension.     Palpations: Abdomen is soft.     Tenderness: There is generalized abdominal  tenderness and tenderness in the epigastric  area. There is no guarding. Negative signs include McBurney's sign.     Comments: Epigastric area of worst pain  Musculoskeletal:        General: No tenderness or deformity. Normal range of motion.     Cervical back: Neck supple.  Skin:    General: Skin is warm and dry.     Findings: No erythema or rash.  Neurological:     Mental Status: She is alert and oriented to person, place, and time.  Psychiatric:        Behavior: Behavior normal.     ED Results / Procedures / Treatments   Labs (all labs ordered are listed, but only abnormal results are displayed) Labs Reviewed  CBG MONITORING, ED - Abnormal; Notable for the following components:      Result Value   Glucose-Capillary 320 (*)    All other components within normal limits  LIPASE, BLOOD  COMPREHENSIVE METABOLIC PANEL  CBC  URINALYSIS, ROUTINE W REFLEX MICROSCOPIC  I-STAT BETA HCG BLOOD, ED ([facility], [facility], [facility] ONLY)    EKG None  Radiology No results found.  Procedures Procedures (including critical care time)  Medications Ordered in ED Medications  ondansetron (ZOFRAN) injection 4 mg (has no administration in time range)  sodium chloride 0.9 % bolus 1,000 mL (has no administration in time range)    ED Course  I have reviewed the triage vital signs and the nursing notes.  Pertinent labs & imaging results that were available during my care of the patient were reviewed by me and considered in my medical decision making (see chart for details).    MDM Rules/Calculators/A&P                           Patient presented with nausea, vomiting and diarrhea.  Initially hypertensive, otherwise vital signs stable.  Mildly tender abdomen, worse in the epigastric region. CBC unremarkable.  CMP shows potassium slightly low at 3.3.  Creatinine elevated at 2.28, slightly elevated from 3 weeks ago.  Patient given 1 L of fluids, Zofran and Phenergan.  She is p.o. tolerant to water.  Abdomen on reevaluation is nontender palpation.   Patient did not wish to stay for second liter of fluids.  During downtime patient was discharged with a paper prescription for Zofran.  She was given verbal instructions to follow-up with primary care for repeat check of her creatinine.  A prescription for Phenergan was sent in.     At this time there does not appear to be any evidence of an acute emergency medical condition and the patient appears stable for discharge with appropriate outpatient follow up.Diagnosis was discussed with patient who verbalizes understanding and is agreeable to discharge. Pt case discussed with Dr. Darl Householder who agrees with my plan.    Final Clinical Impression(s) / ED Diagnoses Final diagnoses:  None    Rx / DC Orders ED Discharge Orders    None       Rachel Moulds 08/14/19 2306    Drenda Freeze, MD 08/15/19 [phone]

## 2019-08-14 NOTE — Discharge Instructions (Addendum)
Make sure you push fluids drinking mostly water but mix it with Gatorade.  Try to eat light meals including soups, broths and soft foods, fruits.  You may use Zofran for your nausea and vomiting once every 8 hours. Please return to the clinic if symptoms worsen or you start having severe abdominal pain not helped by taking Tylenol or start having bloody stools or blood in the vomit.    For diabetes or elevated blood sugar, please make sure you are avoiding starchy, carbohydrate foods like pasta, breads, pastry, rice, potatoes, desserts. These foods can elevated your blood sugar. Also, avoid sodas, sweet teas, sugary beverages, fruit juices.  Drinking plain water will be much more helpful, try 64 ounces of water daily.  It is okay to flavor your water naturally by cutting cucumber, lemon, mint or lime, placing it in a picture with water and drinking it over a period of 2 to 3 days as long as it remains refrigerated.    For elevated blood pressure, make sure you are monitoring salt in your diet.  Do not eat restaurant foods and limit processed foods at home, prepare/cook your own foods at home.  Processed foods include things like frozen meals preseasoned meats and dinners, deli meats, canned foods as they are high in sodium/salt.  Make sure your pain attention to sodium labels on foods you by at the grocery store.  For seasoning you can use a brand called Mrs. Dash which includes a lot of salt free seasonings.  Salads - kale, spinach, cabbage, spring mix; use seeds like pumpkin seeds or sunflower seeds, almonds, walnuts or pecans; you can also use 1-2 hard boiled eggs in your salads Fruits - avocadoes, berries (blueberries, raspberries, blackberries), apples, oranges, pomegranate, pear; avoid eating bananas, grapes regularly Vegetables - aspargus, cauliflower, broccoli, green beans, brussel spouts, bell peppers; stay away from starchy vegetables like potatoes, carrots, peas  Regarding meat it is better to  eat lean meats and limit your red meat including pork to once a week.  Wild caught fish, chicken breast are good options as they tend to be leaner sources of good protein.   DO NOT EAT ANY FOODS ON THIS LIST THAT YOU ARE ALLERGIC TO.

## 2019-08-14 NOTE — Discharge Instructions (Signed)
Patient given verbal discharge instructions.  She was instructed to follow-up with her primary care for recheck of her creatinine.  She was given a paper prescription of Zofran and Phenergan was sent into her pharmacy.

## 2019-08-14 NOTE — ED Notes (Signed)
Patient went to bathroom but did not collect specimen

## 2019-08-14 NOTE — ED Triage Notes (Signed)
Per pt, states she has been vomiting since Sunday-states she went to [facility] this am and was given Zofran and discharged home-states she can't keep anything down-history of DM 1

## 2019-08-18 ENCOUNTER — Encounter (HOSPITAL_COMMUNITY): Payer: Self-pay | Admitting: Emergency Medicine

## 2019-08-18 ENCOUNTER — Other Ambulatory Visit: Payer: Self-pay

## 2019-08-18 DIAGNOSIS — Z7984 Long term (current) use of oral hypoglycemic drugs: Secondary | ICD-10-CM | POA: Diagnosis not present

## 2019-08-18 DIAGNOSIS — J45909 Unspecified asthma, uncomplicated: Secondary | ICD-10-CM | POA: Insufficient documentation

## 2019-08-18 DIAGNOSIS — E10311 Type 1 diabetes mellitus with unspecified diabetic retinopathy with macular edema: Secondary | ICD-10-CM | POA: Insufficient documentation

## 2019-08-18 DIAGNOSIS — R112 Nausea with vomiting, unspecified: Secondary | ICD-10-CM | POA: Insufficient documentation

## 2019-08-18 DIAGNOSIS — I10 Essential (primary) hypertension: Secondary | ICD-10-CM | POA: Insufficient documentation

## 2019-08-18 DIAGNOSIS — Z79899 Other long term (current) drug therapy: Secondary | ICD-10-CM | POA: Insufficient documentation

## 2019-08-18 LAB — LIPASE, BLOOD: Lipase: 30 U/L (ref 11–51)

## 2019-08-18 LAB — COMPREHENSIVE METABOLIC PANEL
ALT: 23 U/L (ref 0–44)
AST: 45 U/L — ABNORMAL HIGH (ref 15–41)
Albumin: 2.3 g/dL — ABNORMAL LOW (ref 3.5–5.0)
Alkaline Phosphatase: 77 U/L (ref 38–126)
Anion gap: 12 (ref 5–15)
BUN: 26 mg/dL — ABNORMAL HIGH (ref 6–20)
CO2: 20 mmol/L — ABNORMAL LOW (ref 22–32)
Calcium: 8.7 mg/dL — ABNORMAL LOW (ref 8.9–10.3)
Chloride: 104 mmol/L (ref 98–111)
Creatinine, Ser: 2.25 mg/dL — ABNORMAL HIGH (ref 0.44–1.00)
GFR calc Af Amer: 33 mL/min — ABNORMAL LOW (ref >60)
GFR calc non Af Amer: 29 mL/min — ABNORMAL LOW (ref >60)
Glucose, Bld: 285 mg/dL — ABNORMAL HIGH (ref 70–99)
Potassium: 4.6 mmol/L (ref 3.5–5.1)
Sodium: 136 mmol/L (ref 135–145)
Total Bilirubin: 2.6 mg/dL — ABNORMAL HIGH (ref 0.3–1.2)
Total Protein: 6.7 g/dL (ref 6.5–8.1)

## 2019-08-18 LAB — CBC
HCT: 41 % (ref 36.0–46.0)
Hemoglobin: 13.1 g/dL (ref 12.0–15.0)
MCH: 25.9 pg — ABNORMAL LOW (ref 26.0–34.0)
MCHC: 32 g/dL (ref 30.0–36.0)
MCV: 81 fL (ref 80.0–100.0)
Platelets: 585 10*3/uL — ABNORMAL HIGH (ref 150–400)
RBC: 5.06 MIL/uL (ref 3.87–5.11)
RDW: 13.1 % (ref 11.5–15.5)
WBC: 9 10*3/uL (ref 4.0–10.5)
nRBC: 0 % (ref 0.0–0.2)

## 2019-08-18 LAB — CBG MONITORING, ED: Glucose-Capillary: 250 mg/dL — ABNORMAL HIGH (ref 70–99)

## 2019-08-18 LAB — HCG, QUANTITATIVE, PREGNANCY: hCG, Beta Chain, Quant, S: 1 m[IU]/mL (ref <5)

## 2019-08-18 MED ORDER — SODIUM CHLORIDE 0.9% FLUSH: 3.0000 mL | Freq: Once | INTRAVENOUS | Status: DC

## 2019-08-18 MED ORDER — ONDANSETRON 4 MG PO TBDP
4.0000 mg | ORAL_TABLET | Freq: Once | ORAL | Status: AC
  Administered 2019-08-19: 4 mg via ORAL
  Filled 2019-08-18: qty 1

## 2019-08-18 NOTE — ED Triage Notes (Signed)
Patient is a type 1 diabetic and has not taken her sugar today. Patient left insulin pump at home because she states it died. Patient is complaining of vomiting.

## 2019-08-18 NOTE — ED Notes (Signed)
Save blue in main lab 

## 2019-08-19 ENCOUNTER — Emergency Department (HOSPITAL_COMMUNITY)
Admission: EM | Admit: 2019-08-19 | Discharge: 2019-08-19 | Disposition: A | Discharge status: Home / Self Care | Payer: BC Managed Care – PPO | Attending: Emergency Medicine | Admitting: Emergency Medicine

## 2019-08-19 DIAGNOSIS — R112 Nausea with vomiting, unspecified: Secondary | ICD-10-CM

## 2019-08-19 LAB — URINALYSIS, ROUTINE W REFLEX MICROSCOPIC
Bilirubin Urine: NEGATIVE
Glucose, UA: >=500 mg/dL — AB
Ketones, ur: 20 mg/dL — AB
Leukocytes,Ua: NEGATIVE
Nitrite: NEGATIVE
Protein, ur: >=300 mg/dL — AB
Specific Gravity, Urine: 1.016 (ref 1.005–1.030)
pH: 6 (ref 5.0–8.0)

## 2019-08-19 LAB — CBG MONITORING, ED: Glucose-Capillary: 322 mg/dL — ABNORMAL HIGH (ref 70–99)

## 2019-08-19 LAB — BLOOD GAS, VENOUS
Acid-base deficit: 6.1 mmol/L — ABNORMAL HIGH (ref 0.0–2.0)
Bicarbonate: 16.6 mmol/L — ABNORMAL LOW (ref 20.0–28.0)
O2 Saturation: 98.5 %
Patient temperature: 98.6
pCO2, Ven: 26.5 mmHg — ABNORMAL LOW (ref 44.0–60.0)
pH, Ven: 7.414 (ref 7.250–7.430)
pO2, Ven: 156 mmHg — ABNORMAL HIGH (ref 32.0–45.0)

## 2019-08-19 MED ORDER — ONDANSETRON HCL 4 MG/2ML IJ SOLN
4.0000 mg | Freq: Once | INTRAMUSCULAR | Status: AC
  Administered 2019-08-19: 4 mg via INTRAVENOUS
  Filled 2019-08-19: qty 2

## 2019-08-19 MED ORDER — METOCLOPRAMIDE HCL 10 MG PO TABS
10.0000 mg | ORAL_TABLET | Freq: Three times a day (TID) | ORAL | 0 refills | Status: DC | PRN
Start: 2019-08-19 — End: 2019-08-21

## 2019-08-19 MED ORDER — SODIUM CHLORIDE 0.9 % IV BOLUS
1000.0000 mL | Freq: Once | INTRAVENOUS | Status: AC
  Administered 2019-08-19: 1000 mL via INTRAVENOUS

## 2019-08-19 NOTE — ED Provider Notes (Signed)
[facility] DEPT Provider Note   CSN: [id number] Arrival date & time: 08/18/19  2101     History Chief Complaint  Patient presents with  . Emesis    Barbara Donovan is a 29 y.o. female.  Patient with past medical history notable for type 1 diabetes, on insulin pump, diabetic retinopathy, hypertension presents to the emergency department with a chief complaint of nausea and vomiting.  She states her symptoms started earlier today.  She denies any diarrhea.  Denies any fevers or chills.  Denies any focal abdominal pain, but does complain of abdominal cramps associated with retching.  She has tried using Zofran and Phenergan at home with no relief.  She states that she has had diabetic gastroparesis in the past, and states that this feels similar.  She denies any dysuria or hematuria.  Denies any other associated symptoms.  Nothing makes his symptoms any better or worse.  The history is provided by the patient. No language interpreter was used.       Past Medical History:  Diagnosis Date  . Asthma    as a child, no problems as an adult, no inhaler  . Cataract    NS OU  . Chronic hypertension during pregnancy, antepartum 08/19/2017  . Dehydration 01/28/2018  . Depression during pregnancy, antepartum 07/07/2017   6/20: Short trial of zoloft previously, reports didn't help much but also didn't give it a chance Discussed r/b/a SSRIs in pregnancy, agrees to try Zoloft again, rx sent No SI/HI/red flags  . Diabetes ([place])    TYPE I  . Diabetic retinopathy ([place]) 06/09/2017   07/2017 with bilateral severe diabetic non-proliferative retinopathy with macular edema.  Marland Kitchen HTN (hypertension)   . Hypertensive retinopathy    OU  . Hypokalemia 01/22/2018  . Hypomagnesemia 01/28/2018  . Intractable nausea and vomiting 01/22/2018  . Intrauterine growth restriction (IUGR) affecting care of mother 12/22/2017  . Morbid obesity ([place])   . 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 Severe hyperemesis gravidarum 10/30/2017  . Type I diabetes mellitus ([place]) 07/07/2017   Current Diabetic Medications:  Insulin  [x]  Aspirin 81 mg daily after 12 weeks (? A2/B GDM)  Required Referrals for A1GDM or A2GDM: [x]  Diabetes Education and Testing Supplies [x]  Nutrition Cousult  For A2/B GDM or higher classes of DM [x]  Diabetes Education and Testing Supplies [x]  Nutrition Counsult [x]  Fetal ECHO after 20 weeks  [x]  Eye exam for retina evaluation - severe retinopathy 7/19  Base  . Ventricular septal defect (VSD) of fetus in singleton pregnancy, antepartum 09/30/2017   May go to newborn nursery per Dr. Lenard Simmer Echo prior to discharge    Patient Active Problem List   Diagnosis Date Noted  . SOB (shortness of breath) 03/27/2018  . Hypomagnesemia 01/28/2018  . Dehydration 01/28/2018  . Intractable nausea and vomiting 01/22/2018  . Hypokalemia 01/22/2018  . 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 Proteinuria due to type 1 diabetes mellitus ([place]) 11/02/2017  . Chronic hypertension 08/21/2017  . Type I diabetes mellitus ([place]) 07/07/2017  . Diabetic retinopathy ([place]) 06/09/2017  . Acute depression 11/02/2014  . Asthma 10/30/2013    Past Surgical History:  Procedure Laterality Date  . [place] VITRECTOMY WITH 20 GAUGE MVR PORT FOR MACULAR HOLE Left 07/20/2018   Procedure: 25 GAUGE PARS PLANA VITRECTOMY LEFT EYE;  Surgeon: Bernarda Caffey, MD;  Location: [facility];  Service: Ophthalmology;  Laterality: Left;  . CESAREAN SECTION N/A 12/26/2017   Procedure: CESAREAN SECTION;  Surgeon: Osborne Oman, MD;  Location: [facility];  Service: Obstetrics;  Laterality: N/A;  . EYE EXAMINATION UNDER ANESTHESIA Right 07/20/2018   Procedure: Eye Exam Under Anesthesia RIGHT EYE;  Surgeon: Bernarda Caffey, MD;  Location: [facility];  Service: Ophthalmology;  Laterality: Right;  . EYE SURGERY Left 07/2018  . GAS  INSERTION Left 07/19/2019   Procedure: INSERTION OF GAS;  Surgeon: Bernarda Caffey, MD;  Location: [facility];  Service: Ophthalmology;  Laterality: Left;  SF6  . INJECTION OF SILICONE OIL Left 01/24/107   Procedure: Injection Of Silicone Oil LEFT EYE;  Surgeon: Bernarda Caffey, MD;  Location: [facility];  Service: Ophthalmology;  Laterality: Left;  . LASER PHOTO ABLATION Right 07/20/2018   Procedure: Laser Photo Ablation RIGHT EYE;  Surgeon: Bernarda Caffey, MD;  Location: [facility];  Service: Ophthalmology;  Laterality: Right;  . MEMBRANE PEEL Left 07/20/2018   Procedure: Membrane Peel LEFT EYE;  Surgeon: Bernarda Caffey, MD;  Location: [facility];  Service: Ophthalmology;  Laterality: Left;  . MEMBRANE PEEL Left 07/19/2019   Procedure: MEMBRANE PEEL;  Surgeon: Bernarda Caffey, MD;  Location: [facility];  Service: Ophthalmology;  Laterality: Left;  . MITOMYCIN C APPLICATION Bilateral 03/20/3555   Procedure: Avastin Application;  Surgeon: Bernarda Caffey, MD;  Location: [facility];  Service: Ophthalmology;  Laterality: Bilateral;  . PHOTOCOAGULATION WITH LASER Left 07/20/2018   Procedure: Photocoagulation With Laser LEFT EYE;  Surgeon: Bernarda Caffey, MD;  Location: [facility];  Service: Ophthalmology;  Laterality: Left;  . PHOTOCOAGULATION WITH LASER Left 07/19/2019   Procedure: PHOTOCOAGULATION WITH LASER;  Surgeon: Bernarda Caffey, MD;  Location: [facility];  Service: Ophthalmology;  Laterality: Left;  . RETINAL DETACHMENT SURGERY Left 07/20/2018   Dr. Bernarda Caffey  . SILICON OIL REMOVAL Left 07/19/2019   Procedure: 25g PARS PLANA VITRECTOMY WITH SILICON OIL REMOVAL;  Surgeon: Bernarda Caffey, MD;  Location: [facility];  Service: Ophthalmology;  Laterality: Left;  . WISDOM TOOTH EXTRACTION       OB History    Gravida  1   Para  1   Term      Preterm  1   AB      Living  1     SAB      TAB      Ectopic      Multiple  0   Live Births  1           Family History  Problem Relation Age of Onset  . Diabetes Mother   . Aneurysm Mother 41  .  Seizures Mother   . Diabetes Father   . Cataracts Father   . COPD Father   . Heart attack Father   . Heart disease Father   . Healthy Sister   . Healthy Daughter   . Stroke Maternal Grandfather   . Amblyopia Neg Hx   . Blindness Neg Hx   . Glaucoma Neg Hx   . Macular degeneration Neg Hx   . Retinal detachment Neg Hx   . Strabismus Neg Hx   . Retinitis pigmentosa Neg Hx     Social History   Tobacco Use  . Smoking status: Never Smoker  . Smokeless tobacco: Never Used  Vaping Use  . Vaping Use: Never used  Substance Use Topics  . Alcohol use: Not Currently    Comment: SOCIALLY  . Drug use: Never    Home  Medications Prior to Admission medications   Medication Sig Start Date End Date Taking? Authorizing Provider  Blood Pressure Monitoring (OMRON 3 SERIES BP MONITOR) DEVI 1 each by Does not apply route daily. Take daily blood pressure. Please report back to Dr. Radford Pax in 2 wks. 08/08/18   Sueanne Margarita, MD  carvedilol (COREG) 25 MG tablet Take 1 tablet (25 mg total) by mouth 2 (two) times daily. 03/07/19   Imogene Burn, PA-C  diltiazem (CARDIZEM CD) 240 MG 24 hr capsule Take 1 capsule (240 mg total) by mouth daily. 03/08/19   Imogene Burn, PA-C  furosemide (LASIX) 20 MG tablet Take 2 tablets (40 mg total) by mouth daily as needed for edema. Patient taking differently: Take 20 mg by mouth daily as needed for edema.  04/19/19   Skeet Latch, MD  hydrALAZINE (APRESOLINE) 50 MG tablet Take 2 tablets (100 mg total) by mouth in the morning AND 1 tablet (50 mg total) daily at 12 noon AND 2 tablets (100 mg total) every evening. Patient taking differently: Take 50 mg by mouth three times daily 06/21/19   Skeet Latch, MD  insulin aspart (NOVOLOG) 100 UNIT/ML injection FOR USE IN PUMP, TOTAL OF 80 UNITS PER DAY. Patient taking differently: Inject 80 Units into the skin daily. FOR USE IN PUMP, TOTAL OF 80 UNITS PER DAY. 08/08/19   Renato Shin, MD  Insulin Pen Needle (PEN  NEEDLES) 32G X 5 MM MISC 1 Device by Does not apply route 4 (four) times daily. 03/23/18   Renato Shin, MD  ondansetron (ZOFRAN-ODT) 8 MG disintegrating tablet Take 1 tablet (8 mg total) by mouth every 8 (eight) hours as needed for nausea or vomiting. Patient not taking: Reported on 08/14/2019 08/14/19   Jaynee Eagles, PA-C  [facility] VERIO test strip USE AS DIRECTED 5 TIMES DAILY 03/09/19   Renato Shin, MD  prednisoLONE acetate (PRED FORTE) 1 % ophthalmic suspension Place 1 drop into the left eye 4 (four) times daily. 07/26/19   Bernarda Caffey, MD  promethazine (PHENERGAN) 25 MG tablet Take 1 tablet (25 mg total) by mouth every 6 (six) hours as needed for nausea or vomiting. 08/14/19   Kendrick, Caitlyn S, PA-C    Allergies    Patient has no known allergies.  Review of Systems   Review of Systems  All other systems reviewed and are negative.   Physical Exam Updated Vital Signs BP (!) 199/115 (BP Location: Left Arm)   Pulse (!) 109   Temp 98.7 F (37.1 C)   Resp 15   Ht 5\' 6"  (1.676 m)   Wt 82.6 kg   LMP 08/14/2019 (Exact Date)   SpO2 100%   BMI 29.38 kg/m   Physical Exam Vitals and nursing note reviewed.  Constitutional:      General: She is not in acute distress.    Appearance: She is well-developed.  HENT:     Head: Normocephalic and atraumatic.  Eyes:     Conjunctiva/sclera: Conjunctivae normal.  Cardiovascular:     Rate and Rhythm: Normal rate and regular rhythm.     Heart sounds: No murmur heard.   Pulmonary:     Effort: Pulmonary effort is normal. No respiratory distress.     Breath sounds: Normal breath sounds.  Abdominal:     Palpations: Abdomen is soft.     Tenderness: There is no abdominal tenderness.     Comments: No focal abdominal tenderness, no RLQ tenderness or pain at McBurney's point, no RUQ tenderness  or Murphy's sign, no left-sided abdominal tenderness, no fluid wave, or signs of peritonitis   Musculoskeletal:        General: Normal range of motion.      Cervical back: Neck supple.  Skin:    General: Skin is warm and dry.  Neurological:     Mental Status: She is alert and oriented to person, place, and time.  Psychiatric:        Mood and Affect: Mood normal.        Behavior: Behavior normal.     ED Results / Procedures / Treatments   Labs (all labs ordered are listed, but only abnormal results are displayed) Labs Reviewed  COMPREHENSIVE METABOLIC PANEL - Abnormal; Notable for the following components:      Result Value   CO2 20 (*)    Glucose, Bld 285 (*)    BUN 26 (*)    Creatinine, Ser 2.25 (*)    Calcium 8.7 (*)    Albumin 2.3 (*)    AST 45 (*)    Total Bilirubin 2.6 (*)    GFR calc non Af Amer 29 (*)    GFR calc Af Amer 33 (*)    All other components within normal limits  CBC - Abnormal; Notable for the following components:   MCH 25.9 (*)    Platelets 585 (*)    All other components within normal limits  URINALYSIS, ROUTINE W REFLEX MICROSCOPIC - Abnormal; Notable for the following components:   Glucose, UA >=500 (*)    Hgb urine dipstick SMALL (*)    Ketones, ur 20 (*)    Protein, ur >=300 (*)    Bacteria, UA RARE (*)    All other components within normal limits  CBG MONITORING, ED - Abnormal; Notable for the following components:   Glucose-Capillary 250 (*)    All other components within normal limits  CBG MONITORING, ED - Abnormal; Notable for the following components:   Glucose-Capillary 322 (*)    All other components within normal limits  URINE CULTURE  LIPASE, BLOOD  HCG, QUANTITATIVE, PREGNANCY  I-STAT VENOUS BLOOD GAS, ED    EKG None  Radiology No results found.  Procedures Ultrasound ED Peripheral IV (Provider)  Date/Time: 08/19/2019 2:22 AM Performed by: Montine Circle, PA-C Authorized by: Montine Circle, PA-C   Procedure details:    Indications: hydration and multiple failed IV attempts     Skin Prep: chlorhexidine gluconate     Location:  Right AC   Angiocath:  18 G   Bedside  Ultrasound Guided: Yes     Images: archived     Patient tolerated procedure without complications: Yes     Dressing applied: Yes     (including critical care time)  Medications Ordered in ED Medications  sodium chloride flush (NS) 0.9 % injection 3 mL (has no administration in time range)  ondansetron (ZOFRAN-ODT) disintegrating tablet 4 mg (4 mg Oral Given 08/19/19 0107)  sodium chloride 0.9 % bolus 1,000 mL (1,000 mLs Intravenous New Bag/Given 08/19/19 0215)  ondansetron (ZOFRAN) injection 4 mg (4 mg Intravenous Given 08/19/19 0218)    ED Course  I have reviewed the triage vital signs and the nursing notes.  Pertinent labs & imaging results that were available during my care of the patient were reviewed by me and considered in my medical decision making (see chart for details).    MDM Rules/Calculators/A&P  Patient presents with nausea and vomiting.  She is type I diabetic.  She states that the vomiting started earlier today.  She denies any fevers chills.  She denies any abdominal pain.  She denies any dysuria or hematuria.  She reports that she does have history of diabetic gastroparesis and states that this feels similar.  Patient given a liter bolus of normal saline along with IV Zofran.  Laboratory work-up notable for hyperglycemia.  Creatinine is baseline.  No leukocytosis.  Anion gap is 12.  No evidence of DKA.  Venous pH is 7.41.  Patient reassessed by me.  She is tolerating oral intake.  She feels ready for discharge.  She is noted to have elevated blood pressure while in the emergency department.  Chart review shows that she has history of this along with some medication noncompliance.  She is encouraged to continue taking her blood pressure medications.  Return precautions discussed.  Final Clinical Impression(s) / ED Diagnoses Final diagnoses:  Non-intractable vomiting with nausea, unspecified vomiting type    Rx / DC Orders ED Discharge Orders     None       Montine Circle, PA-C 08/19/19 4830    Rolland Porter, MD 08/19/19 [phone]

## 2019-08-19 NOTE — Discharge Instructions (Addendum)
Don't use the Reglan in breast feeding.

## 2019-08-20 ENCOUNTER — Other Ambulatory Visit: Payer: Self-pay

## 2019-08-20 ENCOUNTER — Encounter (HOSPITAL_COMMUNITY): Payer: Self-pay

## 2019-08-20 ENCOUNTER — Observation Stay (HOSPITAL_COMMUNITY)
Admission: EM | Admit: 2019-08-20 | Discharge: 2019-08-21 | Disposition: A | Discharge status: Home / Self Care | Payer: BC Managed Care – PPO | Attending: Internal Medicine | Admitting: Internal Medicine

## 2019-08-20 DIAGNOSIS — I10 Essential (primary) hypertension: Secondary | ICD-10-CM | POA: Diagnosis not present

## 2019-08-20 DIAGNOSIS — R109 Unspecified abdominal pain: Secondary | ICD-10-CM | POA: Diagnosis present

## 2019-08-20 DIAGNOSIS — E1029 Type 1 diabetes mellitus with other diabetic kidney complication: Secondary | ICD-10-CM | POA: Insufficient documentation

## 2019-08-20 DIAGNOSIS — R112 Nausea with vomiting, unspecified: Principal | ICD-10-CM | POA: Insufficient documentation

## 2019-08-20 DIAGNOSIS — E109 Type 1 diabetes mellitus without complications: Secondary | ICD-10-CM | POA: Diagnosis present

## 2019-08-20 DIAGNOSIS — Z794 Long term (current) use of insulin: Secondary | ICD-10-CM | POA: Diagnosis not present

## 2019-08-20 DIAGNOSIS — Z20822 Contact with and (suspected) exposure to covid-19: Secondary | ICD-10-CM | POA: Insufficient documentation

## 2019-08-20 DIAGNOSIS — Z79899 Other long term (current) drug therapy: Secondary | ICD-10-CM | POA: Insufficient documentation

## 2019-08-20 DIAGNOSIS — J45909 Unspecified asthma, uncomplicated: Secondary | ICD-10-CM | POA: Insufficient documentation

## 2019-08-20 DIAGNOSIS — R809 Proteinuria, unspecified: Secondary | ICD-10-CM | POA: Diagnosis present

## 2019-08-20 DIAGNOSIS — E1121 Type 2 diabetes mellitus with diabetic nephropathy: Secondary | ICD-10-CM | POA: Diagnosis present

## 2019-08-20 DIAGNOSIS — E1043 Type 1 diabetes mellitus with diabetic autonomic (poly)neuropathy: Secondary | ICD-10-CM | POA: Insufficient documentation

## 2019-08-20 DIAGNOSIS — E1022 Type 1 diabetes mellitus with diabetic chronic kidney disease: Secondary | ICD-10-CM | POA: Diagnosis present

## 2019-08-20 DIAGNOSIS — E860 Dehydration: Secondary | ICD-10-CM | POA: Diagnosis present

## 2019-08-20 LAB — CBC WITH DIFFERENTIAL/PLATELET
Abs Immature Granulocytes: 0.04 10*3/uL (ref 0.00–0.07)
Basophils Absolute: 0 10*3/uL (ref 0.0–0.1)
Basophils Relative: 0 %
Eosinophils Absolute: 0 10*3/uL (ref 0.0–0.5)
Eosinophils Relative: 0 %
HCT: 41.8 % (ref 36.0–46.0)
Hemoglobin: 13.7 g/dL (ref 12.0–15.0)
Immature Granulocytes: 0 %
Lymphocytes Relative: 11 %
Lymphs Abs: 1.1 10*3/uL (ref 0.7–4.0)
MCH: 26.6 pg (ref 26.0–34.0)
MCHC: 32.8 g/dL (ref 30.0–36.0)
MCV: 81 fL (ref 80.0–100.0)
Monocytes Absolute: 0.3 10*3/uL (ref 0.1–1.0)
Monocytes Relative: 2 %
Neutro Abs: 9.1 10*3/uL — ABNORMAL HIGH (ref 1.7–7.7)
Neutrophils Relative %: 87 %
Platelets: 583 10*3/uL — ABNORMAL HIGH (ref 150–400)
RBC: 5.16 MIL/uL — ABNORMAL HIGH (ref 3.87–5.11)
RDW: 13 % (ref 11.5–15.5)
WBC: 10.5 10*3/uL (ref 4.0–10.5)
nRBC: 0 % (ref 0.0–0.2)

## 2019-08-20 LAB — URINALYSIS, ROUTINE W REFLEX MICROSCOPIC
Bilirubin Urine: NEGATIVE
Glucose, UA: >=500 mg/dL — AB
Ketones, ur: 20 mg/dL — AB
Leukocytes,Ua: NEGATIVE
Nitrite: NEGATIVE
Protein, ur: >=300 mg/dL — AB
Specific Gravity, Urine: 1.017 (ref 1.005–1.030)
pH: 6 (ref 5.0–8.0)

## 2019-08-20 LAB — COMPREHENSIVE METABOLIC PANEL
ALT: 29 U/L (ref 0–44)
AST: 30 U/L (ref 15–41)
Albumin: 2.6 g/dL — ABNORMAL LOW (ref 3.5–5.0)
Alkaline Phosphatase: 80 U/L (ref 38–126)
Anion gap: 14 (ref 5–15)
BUN: 34 mg/dL — ABNORMAL HIGH (ref 6–20)
CO2: 21 mmol/L — ABNORMAL LOW (ref 22–32)
Calcium: 8.6 mg/dL — ABNORMAL LOW (ref 8.9–10.3)
Chloride: 99 mmol/L (ref 98–111)
Creatinine, Ser: 2.44 mg/dL — ABNORMAL HIGH (ref 0.44–1.00)
GFR calc Af Amer: 30 mL/min — ABNORMAL LOW (ref >60)
GFR calc non Af Amer: 26 mL/min — ABNORMAL LOW (ref >60)
Glucose, Bld: 435 mg/dL — ABNORMAL HIGH (ref 70–99)
Potassium: 3.5 mmol/L (ref 3.5–5.1)
Sodium: 134 mmol/L — ABNORMAL LOW (ref 135–145)
Total Bilirubin: 1 mg/dL (ref 0.3–1.2)
Total Protein: 7 g/dL (ref 6.5–8.1)

## 2019-08-20 LAB — URINE CULTURE

## 2019-08-20 LAB — CBG MONITORING, ED
Glucose-Capillary: 414 mg/dL — ABNORMAL HIGH (ref 70–99)
Glucose-Capillary: 355 mg/dL — ABNORMAL HIGH (ref 70–99)
Glucose-Capillary: 366 mg/dL — ABNORMAL HIGH (ref 70–99)
Glucose-Capillary: 267 mg/dL — ABNORMAL HIGH (ref 70–99)
Glucose-Capillary: 170 mg/dL — ABNORMAL HIGH (ref 70–99)

## 2019-08-20 LAB — RAPID URINE DRUG SCREEN, HOSP PERFORMED
Amphetamines: NOT DETECTED
Barbiturates: NOT DETECTED
Benzodiazepines: NOT DETECTED
Cocaine: NOT DETECTED
Opiates: NOT DETECTED
Tetrahydrocannabinol: NOT DETECTED

## 2019-08-20 LAB — BLOOD GAS, VENOUS
Acid-base deficit: 2.1 mmol/L — ABNORMAL HIGH (ref 0.0–2.0)
Bicarbonate: 20 mmol/L (ref 20.0–28.0)
FIO2: 21
O2 Saturation: 54.8 %
Patient temperature: 98.6
pCO2, Ven: 27.9 mmHg — ABNORMAL LOW (ref 44.0–60.0)
pH, Ven: 7.468 — ABNORMAL HIGH (ref 7.250–7.430)
pO2, Ven: 31.3 mmHg — CL (ref 32.0–45.0)

## 2019-08-20 LAB — I-STAT BETA HCG BLOOD, ED (MC, WL, AP ONLY): I-stat hCG, quantitative: <5 m[IU]/mL (ref <5)

## 2019-08-20 LAB — LIPASE, BLOOD: Lipase: 36 U/L (ref 11–51)

## 2019-08-20 LAB — SARS CORONAVIRUS 2 BY RT PCR (HOSPITAL ORDER, PERFORMED IN ~~LOC~~ HOSPITAL LAB): SARS Coronavirus 2: NEGATIVE

## 2019-08-20 LAB — HIV ANTIBODY (ROUTINE TESTING W REFLEX): HIV Screen 4th Generation wRfx: NONREACTIVE

## 2019-08-20 MED ORDER — HYDRALAZINE HCL 20 MG/ML IJ SOLN
10.0000 mg | Freq: Four times a day (QID) | INTRAMUSCULAR | Status: DC
  Administered 2019-08-20 – 2019-08-21 (×3): 10 mg via INTRAVENOUS
  Filled 2019-08-20 (×3): qty 1

## 2019-08-20 MED ORDER — PROMETHAZINE HCL 25 MG/ML IJ SOLN
12.5000 mg | Freq: Once | INTRAMUSCULAR | Status: AC
  Administered 2019-08-20: 12.5 mg via INTRAVENOUS
  Filled 2019-08-20: qty 1

## 2019-08-20 MED ORDER — ONDANSETRON 4 MG PO TBDP
4.0000 mg | ORAL_TABLET | Freq: Once | ORAL | Status: AC | PRN
  Administered 2019-08-20: 4 mg via ORAL
  Filled 2019-08-20: qty 1

## 2019-08-20 MED ORDER — HALOPERIDOL LACTATE 5 MG/ML IJ SOLN
1.0000 mg | Freq: Once | INTRAMUSCULAR | Status: AC
  Administered 2019-08-20: 1 mg via INTRAVENOUS
  Filled 2019-08-20: qty 1

## 2019-08-20 MED ORDER — ENOXAPARIN SODIUM 40 MG/0.4ML ~~LOC~~ SOLN
40.0000 mg | SUBCUTANEOUS | Status: DC
  Administered 2019-08-20: 40 mg via SUBCUTANEOUS
  Filled 2019-08-20: qty 0.4

## 2019-08-20 MED ORDER — LABETALOL HCL 5 MG/ML IV SOLN: 10.0000 mg | INTRAVENOUS | Status: DC | PRN

## 2019-08-20 MED ORDER — SODIUM CHLORIDE 0.9 % IV BOLUS
500.0000 mL | Freq: Once | INTRAVENOUS | Status: AC
  Administered 2019-08-20: 500 mL via INTRAVENOUS

## 2019-08-20 MED ORDER — INSULIN GLARGINE 100 UNIT/ML ~~LOC~~ SOLN
25.0000 [IU] | Freq: Every day | SUBCUTANEOUS | Status: DC
  Administered 2019-08-20 – 2019-08-21 (×2): 25 [IU] via SUBCUTANEOUS
  Filled 2019-08-20 (×2): qty 0.25

## 2019-08-20 MED ORDER — ONDANSETRON HCL 4 MG PO TABS: 4.0000 mg | ORAL_TABLET | Freq: Four times a day (QID) | ORAL | Status: DC | PRN

## 2019-08-20 MED ORDER — SODIUM CHLORIDE 0.9 % IV SOLN: INTRAVENOUS | Status: DC

## 2019-08-20 MED ORDER — ACETAMINOPHEN 650 MG RE SUPP: 650.0000 mg | Freq: Four times a day (QID) | RECTAL | Status: DC | PRN

## 2019-08-20 MED ORDER — ONDANSETRON HCL 4 MG/2ML IJ SOLN
4.0000 mg | Freq: Four times a day (QID) | INTRAMUSCULAR | Status: DC | PRN
  Administered 2019-08-20: 4 mg via INTRAVENOUS
  Filled 2019-08-20: qty 2

## 2019-08-20 MED ORDER — METOCLOPRAMIDE HCL 5 MG/ML IJ SOLN
10.0000 mg | Freq: Four times a day (QID) | INTRAMUSCULAR | Status: AC
  Administered 2019-08-20 – 2019-08-21 (×3): 10 mg via INTRAVENOUS
  Filled 2019-08-20 (×3): qty 2

## 2019-08-20 MED ORDER — PREDNISOLONE ACETATE 1 % OP SUSP
1.0000 [drp] | Freq: Four times a day (QID) | OPHTHALMIC | Status: DC
  Filled 2019-08-20: qty 5

## 2019-08-20 MED ORDER — ACETAMINOPHEN 325 MG PO TABS
650.0000 mg | ORAL_TABLET | Freq: Four times a day (QID) | ORAL | Status: DC | PRN
  Filled 2019-08-20: qty 2

## 2019-08-20 MED ORDER — CARVEDILOL 12.5 MG PO TABS
25.0000 mg | ORAL_TABLET | Freq: Two times a day (BID) | ORAL | Status: DC
  Administered 2019-08-20 – 2019-08-21 (×3): 25 mg via ORAL
  Filled 2019-08-20 (×3): qty 2

## 2019-08-20 MED ORDER — INSULIN ASPART 100 UNIT/ML ~~LOC~~ SOLN
0.0000 [IU] | SUBCUTANEOUS | Status: DC
  Administered 2019-08-20: 2 [IU] via SUBCUTANEOUS
  Administered 2019-08-20: 5 [IU] via SUBCUTANEOUS
  Filled 2019-08-20: qty 0.09

## 2019-08-20 MED ORDER — PROMETHAZINE HCL 25 MG PO TABS: 25.0000 mg | ORAL_TABLET | Freq: Four times a day (QID) | ORAL | Status: DC | PRN

## 2019-08-20 MED ORDER — LABETALOL HCL 5 MG/ML IV SOLN
5.0000 mg | Freq: Once | INTRAVENOUS | Status: AC
  Administered 2019-08-20: 5 mg via INTRAVENOUS
  Filled 2019-08-20: qty 4

## 2019-08-20 MED ORDER — INSULIN ASPART 100 UNIT/ML ~~LOC~~ SOLN
0.0000 [IU] | SUBCUTANEOUS | Status: DC
  Administered 2019-08-20: 15 [IU] via SUBCUTANEOUS
  Filled 2019-08-20: qty 0.15

## 2019-08-20 MED ORDER — DILTIAZEM HCL ER COATED BEADS 120 MG PO CP24
240.0000 mg | ORAL_CAPSULE | Freq: Every day | ORAL | Status: DC
  Administered 2019-08-20 – 2019-08-21 (×2): 240 mg via ORAL
  Filled 2019-08-20 (×2): qty 2

## 2019-08-20 NOTE — H&P (Signed)
History and Physical    Barbara Donovan KVQ:[id number] DOB: 05-26-1990 DOA: 08/20/2019  PCP: Fanny Bien, MD   Patient coming from: Home  Chief Complaint  Patient presents with  . Abdominal Pain  . Emesis  . Hyperglycemia      HPI: Barbara Donovan is a 29 y.o. female with medical history significant for type I diabetes mellitus on insulin, obesity, hypertension, resistant hypertension, history of gastroparesis, morbid obesity to the ED for evaluation of intractable nausea and vomiting onset 2 days ago.  Patient has had intractable nausea and vomiting has tried Phenergan and Zofran without relief.  Every time she eats or drinks she has nonbloody nonbilious vomiting and has mild abdominal cramping with the vomiting, no abdominal pain at rest.  She is unable to take her home medication daily her insulin and antihypertensive.  She was seen in the ED for similar complaint on 7/27 and 7/31st.  ED Course: Blood pressure poorly controlled initially 224/129, saturating well on room air, BHP 7.46, PCO2 27, PO2 31, ABG pH 7 4, PCO2 26, PO2 156.  Routine lab work shows bicarb 21 glucose 435, elevated BUN/creatinine 34/2.4 baseline around 2.2, anion gap 14.  COVID-19 pending, UA but her UA few days ago showed WBC 11-20 bacteria rare and culture with multiple species.  Patient received labetalol 5 mg, IV fluids Zofran Phenergan, Haldol 1 mg. She is currently resting after haldol. She denies any abdominal pain, chest pain, headache. She has not take her insuli nand oral medicine for 2 days now. Her BP has bee nrunning in 200s.   Review of Systems: All systems were reviewed and were negative except as mentioned in HPI above. Negative for fever Negative for chest pain Negative for shortness of breath  Past Medical History:  Diagnosis Date  . Asthma    as a child, no problems as an adult, no inhaler  . Cataract    NS OU  . Chronic hypertension during pregnancy, antepartum 08/19/2017  .  Dehydration 01/28/2018  . Depression during pregnancy, antepartum 07/07/2017   6/20: Short trial of zoloft previously, reports didn't help much but also didn't give it a chance Discussed r/b/a SSRIs in pregnancy, agrees to try Zoloft again, rx sent No SI/HI/red flags  . Diabetes ([place])    TYPE I  . 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  Marland Kitchen HTN (hypertension)   . Hypertensive retinopathy    OU  . Hypokalemia 01/22/2018  . Hypomagnesemia 01/28/2018  . Intractable nausea and vomiting 01/22/2018  . Intrauterine growth restriction (IUGR) affecting care of mother 12/22/2017  . Morbid obesity ([place])   . 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 Proteinuria due to type 1 diabetes mellitus ([place]) 11/02/2017   Baseline Pr: Cr 10.23  . Severe hyperemesis gravidarum 10/30/2017  . Type I diabetes mellitus ([place]) 07/07/2017   Current Diabetic Medications:  Insulin  [x]  Aspirin 81 mg daily after 12 weeks (? A2/B GDM)  Required Referrals for A1GDM or A2GDM: [x]  Diabetes Education and Testing Supplies [x]  Nutrition Cousult  For A2/B GDM or higher classes of DM [x]  Diabetes Education and Testing Supplies [x]  Nutrition Counsult [x]  Fetal ECHO after 20 weeks  [x]  Eye exam for retina evaluation - severe retinopathy 7/19  Base  . Ventricular septal defect (VSD) of fetus in singleton pregnancy, antepartum 09/30/2017   May go to newborn nursery per Dr. Lenard Simmer Echo prior to discharge    Past Surgical  History:  Procedure Laterality Date  . [place] VITRECTOMY WITH 20 GAUGE MVR PORT FOR MACULAR HOLE Left 07/20/2018   Procedure: 25 GAUGE PARS PLANA VITRECTOMY LEFT EYE;  Surgeon: Bernarda Caffey, MD;  Location: [place];  Service: Ophthalmology;  Laterality: Left;  . CESAREAN SECTION N/A 12/26/2017   Procedure: CESAREAN SECTION;  Surgeon: Osborne Oman, MD;  Location: [facility];  Service: Obstetrics;  Laterality: N/A;  . EYE  EXAMINATION UNDER ANESTHESIA Right 07/20/2018   Procedure: Eye Exam Under Anesthesia RIGHT EYE;  Surgeon: Bernarda Caffey, MD;  Location: [place];  Service: Ophthalmology;  Laterality: Right;  . EYE SURGERY Left 07/2018  . GAS INSERTION Left 07/19/2019   Procedure: INSERTION OF GAS;  Surgeon: Bernarda Caffey, MD;  Location: [place];  Service: Ophthalmology;  Laterality: Left;  SF6  . INJECTION OF SILICONE OIL Left 01/21/9700   Procedure: Injection Of Silicone Oil LEFT EYE;  Surgeon: Bernarda Caffey, MD;  Location: [facility];  Service: Ophthalmology;  Laterality: Left;  . LASER PHOTO ABLATION Right 07/20/2018   Procedure: Laser Photo Ablation RIGHT EYE;  Surgeon: Bernarda Caffey, MD;  Location: [place];  Service: Ophthalmology;  Laterality: Right;  . MEMBRANE PEEL Left 07/20/2018   Procedure: Membrane Peel LEFT EYE;  Surgeon: Bernarda Caffey, MD;  Location: [place];  Service: Ophthalmology;  Laterality: Left;  . MEMBRANE PEEL Left 07/19/2019   Procedure: MEMBRANE PEEL;  Surgeon: Bernarda Caffey, MD;  Location: [place];  Service: Ophthalmology;  Laterality: Left;  . MITOMYCIN C APPLICATION Bilateral 06/20/7856   Procedure: Avastin Application;  Surgeon: Bernarda Caffey, MD;  Location: [facility];  Service: Ophthalmology;  Laterality: Bilateral;  . PHOTOCOAGULATION WITH LASER Left 07/20/2018   Procedure: Photocoagulation With Laser LEFT EYE;  Surgeon: Bernarda Caffey, MD;  Location: [place];  Service: Ophthalmology;  Laterality: Left;  . PHOTOCOAGULATION WITH LASER Left 07/19/2019   Procedure: PHOTOCOAGULATION WITH LASER;  Surgeon: Bernarda Caffey, MD;  Location: [place];  Service: Ophthalmology;  Laterality: Left;  . RETINAL DETACHMENT SURGERY Left 07/20/2018   Dr. Bernarda Caffey  . SILICON OIL REMOVAL Left 07/19/2019   Procedure: 25g PARS PLANA VITRECTOMY WITH SILICON OIL REMOVAL;  Surgeon: Bernarda Caffey, MD;  Location: [place];  Service: Ophthalmology;  Laterality: Left;  . WISDOM TOOTH EXTRACTION       reports that she has never smoked. She has never  used smokeless tobacco. She reports previous alcohol use. She reports that she does not use drugs.  No Known Allergies  Family History  Problem Relation Age of Onset  . Diabetes Mother   . Aneurysm Mother 39  . Seizures Mother   . Diabetes Father   . Cataracts Father   . COPD Father   . Heart attack Father   . Heart disease Father   . Healthy Sister   . Healthy Daughter   . Stroke Maternal Grandfather   . Amblyopia Neg Hx   . Blindness Neg Hx   . Glaucoma Neg Hx   . Macular degeneration Neg Hx   . Retinal detachment Neg Hx   . Strabismus Neg Hx   . Retinitis pigmentosa Neg Hx      Prior to Admission medications   Medication Sig Start Date End Date Taking? Authorizing Provider  carvedilol (COREG) 25 MG tablet Take 1 tablet (25 mg total) by mouth 2 (two) times daily. 03/07/19  Yes Imogene Burn, PA-C  diltiazem (CARDIZEM CD) 240 MG 24 hr capsule Take 1 capsule (240 mg  total) by mouth daily. 03/08/19  Yes Imogene Burn, PA-C  furosemide (LASIX) 20 MG tablet Take 2 tablets (40 mg total) by mouth daily as needed for edema. Patient taking differently: Take 20 mg by mouth daily as needed for edema.  04/19/19  Yes Skeet Latch, MD  hydrALAZINE (APRESOLINE) 50 MG tablet Take 2 tablets (100 mg total) by mouth in the morning AND 1 tablet (50 mg total) daily at 12 noon AND 2 tablets (100 mg total) every evening. Patient taking differently: Take 50 mg by mouth three times daily 06/21/19  Yes Skeet Latch, MD  insulin aspart (NOVOLOG) 100 UNIT/ML injection FOR USE IN PUMP, TOTAL OF 80 UNITS PER DAY. Patient taking differently: Inject 80 Units into the skin daily. FOR USE IN PUMP, TOTAL OF 80 UNITS PER DAY. 08/08/19  Yes Renato Shin, MD  irbesartan (AVAPRO) 300 MG tablet Take 300 mg by mouth daily. 08/16/19  Yes [provider]  ondansetron (ZOFRAN-ODT) 8 MG disintegrating tablet Take 1 tablet (8 mg total) by mouth every 8 (eight) hours as needed for nausea or vomiting.  08/14/19  Yes Jaynee Eagles, PA-C  prednisoLONE acetate (PRED FORTE) 1 % ophthalmic suspension Place 1 drop into the left eye 4 (four) times daily. 07/26/19  Yes Bernarda Caffey, MD  promethazine (PHENERGAN) 25 MG tablet Take 1 tablet (25 mg total) by mouth every 6 (six) hours as needed for nausea or vomiting. 08/14/19  Yes Kendrick, Caitlyn S, PA-C  Blood Pressure Monitoring (OMRON 3 SERIES BP MONITOR) DEVI 1 each by Does not apply route daily. Take daily blood pressure. Please report back to Dr. Radford Pax in 2 wks. 08/08/18   Sueanne Margarita, MD  Insulin Pen Needle (PEN NEEDLES) 32G X 5 MM MISC 1 Device by Does not apply route 4 (four) times daily. 03/23/18   Renato Shin, MD  metoCLOPramide (REGLAN) 10 MG tablet Take 1 tablet (10 mg total) by mouth every 8 (eight) hours as needed for nausea. Patient not taking: Reported on 08/20/2019 08/19/19   Montine Circle, PA-C  [facility] VERIO test strip USE AS DIRECTED 5 TIMES DAILY 03/09/19   Renato Shin, MD    Physical Exam: Vitals:   08/20/19 1430 08/20/19 1500 08/20/19 1530 08/20/19 1605  BP: (!) 223/121 (!) 205/104 (!) 176/111 (!) 172/87  Pulse: (!) 101 90 99 (!) 105  Resp: 15 14 18 16   Temp:      TempSrc:      SpO2: 100% 99% 100% 100%  Weight:      Height:        General exam: sleepy. , able to interact with nods and speech, obese. HEENT:Oral mucosa moist, Ear/Nose WNL grossly, dentition normal. Respiratory system: bilaterally clear,no wheezing or crackles,no use of accessory muscle Cardiovascular system: S1 & S2 +, No JVD,. Gastrointestinal system: Abdomen soft, NT,ND, BS+ Nervous System: sleepy, able to move her legs, arms Extremities: No edema, distal peripheral pulses palpable.  Skin: No rashes,no icterus. MSK: Normal muscle bulk,tone, power   Labs on Admission: I have personally reviewed following labs and imaging studies  CBC: Recent Labs  Lab 08/14/19 1403 08/18/19 2147 08/20/19 1221  WBC 10.6* 9.0 10.5  NEUTROABS  --   --  9.1*  HGB  13.6 13.1 13.7  HCT 41.8 41.0 41.8  MCV 82.0 81.0 81.0  PLT 531* 585* 347*   Basic Metabolic Panel: Recent Labs  Lab 08/14/19 1403 08/18/19 2147 08/20/19 1221  NA 136 136 134*  K 3.3* 4.6 3.5  CL 100 104 99  CO2 23 20* 21*  GLUCOSE 291* 285* 435*  BUN 22* 26* 34*  CREATININE 2.28* 2.25* 2.44*  CALCIUM 8.6* 8.7* 8.6*   GFR: Estimated Creatinine Clearance: 37.4 mL/min (A) (by C-G formula based on SCr of 2.44 mg/dL (H)). Liver Function Tests: Recent Labs  Lab 08/14/19 1403 08/18/19 2147 08/20/19 1221  AST 38 45* 30  ALT 25 23 29   ALKPHOS 78 77 80  BILITOT 0.5 2.6* 1.0  PROT 7.1 6.7 7.0  ALBUMIN 2.4* 2.3* 2.6*   Recent Labs  Lab 08/14/19 1403 08/18/19 2147 08/20/19 1221  LIPASE 28 30 36   No results for input(s): AMMONIA in the last 168 hours. Coagulation Profile: No results for input(s): INR, PROTIME in the last 168 hours. Cardiac Enzymes: No results for input(s): CKTOTAL, CKMB, CKMBINDEX, TROPONINI in the last 168 hours. BNP (last 3 results) No results for input(s): PROBNP in the last 8760 hours. HbA1C: No results for input(s): HGBA1C in the last 72 hours. CBG: Recent Labs  Lab 08/18/19 2144 08/19/19 0149 08/20/19 1125 08/20/19 1512 08/20/19 1547  GLUCAP 250* 322* 414* 355* 366*   Lipid Profile: No results for input(s): CHOL, HDL, LDLCALC, TRIG, CHOLHDL, LDLDIRECT in the last 72 hours. Thyroid Function Tests: No results for input(s): TSH, T4TOTAL, FREET4, T3FREE, THYROIDAB in the last 72 hours. Anemia Panel: No results for input(s): VITAMINB12, FOLATE, FERRITIN, TIBC, IRON, RETICCTPCT in the last 72 hours. Urine analysis:    Component Value Date/Time   COLORURINE YELLOW 08/20/2019 1351   APPEARANCEUR CLEAR 08/20/2019 1351   LABSPEC 1.017 08/20/2019 1351   PHURINE 6.0 08/20/2019 1351   GLUCOSEU >=500 (A) 08/20/2019 1351   HGBUR MODERATE (A) 08/20/2019 1351   BILIRUBINUR NEGATIVE 08/20/2019 1351   KETONESUR 20 (A) 08/20/2019 1351   PROTEINUR  >=300 (A) 08/20/2019 1351   UROBILINOGEN 0.2 08/14/2019 1051   NITRITE NEGATIVE 08/20/2019 1351   LEUKOCYTESUR NEGATIVE 08/20/2019 1351    Radiological Exams on Admission: No results found.   Assessment/Plan  Intractable nausea and vomiting the setting of diabetic gastroparesis.  Multiple ED visits for similar complaint.  Admit the patient keep on liquid diet nausea medication Protonix, symptomatic management.  Add scheduled Reglan- monitor qtc which is borderline. Follow-up urine drug screen-denies illicit substance use tho.  Uncontrolled hypertension with resistant htn on multiple antihypertensive regimen.  We will keep  Her on  IV labetalol  Prn and add scheduled hydralazine iv. Unable to take po but will see if she can take her home Coreg 25 twice daily, Cardizem 240 daily/ Hold losartan due to AKI, hopefully she will will tolerate p.o. medication along with supportive measures for nausea vomiting. She has not taken her meds and BP running in 200s at home.  Type I diabetes mellitus uncontrolled hyperglycemia on insulin pump at home.Not taking insulin x2 days due to nausea and vomiting.  Dm coordinator on consult. Add lantus 25 units daily and sensitive scale Q4Hsliding scale,monitor accucheck and adjust insulin regimen. Recent hemoglobin A1c 8.o on 7/21st  Hyponatremia-cont gentle ivf.  Metabolic acidosis/Dehydration- from#1, keep in ivf.  Body mass index is 29.86 kg/m.   Severity of Illness:  * I certify that at the point of admission it is my clinical judgment that the patient will require inpatient hospital care spanning less thant 2 midnights from the point of admission due to high intensity of service, high risk for further deterioration and high frequency of surveillance required.*   DVT prophylaxis: enoxaparin (LOVENOX) injection 40  mg Start: 08/20/19 1600 Code Status:   Code Status: Full Code  Family Communication: Admission, patients condition and plan of care including  tests being ordered have been discussed with the patient  who indicate understanding and agree with the plan and Code Status.  Consults called:  None  Antonieta Pert MD [facility]  If 7PM-7AM, please contact night-coverage [email or web address]  08/20/2019, 4:36 PM

## 2019-08-20 NOTE — ED Notes (Signed)
VBG PO2 is 31.3 per lab

## 2019-08-20 NOTE — ED Notes (Signed)
Patient is aware we need a urine sample, doesn't feel like she can urinate at this time.  Bolus started.

## 2019-08-20 NOTE — Progress Notes (Signed)
Inpatient Diabetes Program Recommendations  AACE/ADA: New Consensus Statement on Inpatient Glycemic Control (2015)  Target Ranges:  Prepandial:   less than 140 mg/dL      Peak postprandial:   less than 180 mg/dL (1-2 hours)      Critically ill patients:  140 - 180 mg/dL   Lab Results  Component Value Date   GLUCAP 355 (H) 08/20/2019   HGBA1C 8.0 (A) 08/08/2019    Review of Glycemic Control Results for Barbara Donovan, Barbara Donovan (MRN [id number]) as of 08/20/2019 15:34  Ref. Range 08/19/2019 01:49 08/20/2019 11:25 08/20/2019 15:12  Glucose-Capillary Latest Ref Range: 70 - 99 mg/dL 322 (H) 414 (H) 355 (H)   Diabetes history: Type 1 DM Outpatient Diabetes medications: Insulin pump:  basal rate of 2.5 unit/hr, 7 AM-9 PM, and 0.8 units/hr overnight (see's Dr. Loanne Drilling) Current orders for Inpatient glycemic control:  Novolog moderate q 4 hours  Inpatient Diabetes Program Recommendations:    Note history of Type 1 DM.  Patient uses insulin pump.  Consider weight based basal insulin while insulin pump is off (0.3 units/kg) which would be Lantus 25 units daily.  Also please reduce Novolog correction to sensitive q 4 hours.    Thanks  Adah Perl, RN, BC-ADM Inpatient Diabetes Coordinator Pager [phone] (8a-5p)

## 2019-08-20 NOTE — ED Notes (Signed)
Upon entering room pt requested emesis bag, pt dry heaving after ambulating to BR

## 2019-08-20 NOTE — ED Triage Notes (Signed)
Patient is diabetic and has not used her insulin pump in 2 days.  Patient c/o abdominal pain and vomiting x 2 days. Patient states she came to the ED yesterday for the same.  CBG in triage-[phone]

## 2019-08-20 NOTE — ED Notes (Signed)
Pt repositioned in bed, given diet ginger ale, water and water pitcher

## 2019-08-20 NOTE — ED Provider Notes (Addendum)
[facility] DEPT Provider Note   CSN: [id number] Arrival date & time: 08/20/19  1039     History Chief Complaint  Patient presents with  . Abdominal Pain  . Emesis  . Hyperglycemia    LAYLA KESLING is a 29 y.o. female history of diabetes, asthma, obesity, hypertension.  Patient presents today for nausea vomiting onset 2 days ago symptoms have been constant severe no clear aggravating or alleviating factors she has attempted Phenergan and Zofran without relief of her symptoms.  She reports vomiting anytime she attempts to eat or drink, nonbloody/nonbilious emesis.  Reports mild abdominal cramping with her nausea and vomiting, no abdominal pain at rest.  She reports inability to take any of her home medications including her insulin or antihypertensives secondary to nausea and vomiting.  Denies fever/chills, fall/injury, chest pain/shortness of breath, dysuria/hematuria, diarrhea, vaginal bleeding/discharge or additional concerns. HPI     Past Medical History:  Diagnosis Date  . Asthma    as a child, no problems as an adult, no inhaler  . Cataract    NS OU  . Chronic hypertension during pregnancy, antepartum 08/19/2017  . Dehydration 01/28/2018  . Depression during pregnancy, antepartum 07/07/2017   6/20: Short trial of zoloft previously, reports didn't help much but also didn't give it a chance Discussed r/b/a SSRIs in pregnancy, agrees to try Zoloft again, rx sent No SI/HI/red flags  . Diabetes ([place])    TYPE I  . 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  Marland Kitchen HTN (hypertension)   . Hypertensive retinopathy    OU  . Hypokalemia 01/22/2018  . Hypomagnesemia 01/28/2018  . Intractable nausea and vomiting 01/22/2018  . Intrauterine growth restriction (IUGR) affecting care of mother 12/22/2017  . Morbid obesity ([place])   . 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  Proteinuria due to type 1 diabetes mellitus ([place]) 11/02/2017   Baseline Pr: Cr 10.23  . Severe hyperemesis gravidarum 10/30/2017  . Type I diabetes mellitus ([place]) 07/07/2017   Current Diabetic Medications:  Insulin  [x]  Aspirin 81 mg daily after 12 weeks (? A2/B GDM)  Required Referrals for A1GDM or A2GDM: [x]  Diabetes Education and Testing Supplies [x]  Nutrition Cousult  For A2/B GDM or higher classes of DM [x]  Diabetes Education and Testing Supplies [x]  Nutrition Counsult [x]  Fetal ECHO after 20 weeks  [x]  Eye exam for retina evaluation - severe retinopathy 7/19  Base  . Ventricular septal defect (VSD) of fetus in singleton pregnancy, antepartum 09/30/2017   May go to newborn nursery per Dr. Lenard Simmer Echo prior to discharge    Patient Active Problem List   Diagnosis Date Noted  . SOB (shortness of breath) 03/27/2018  . Hypomagnesemia 01/28/2018  . Dehydration 01/28/2018  . Intractable nausea and vomiting 01/22/2018  . Hypokalemia 01/22/2018  . 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 Chronic hypertension 08/21/2017  . Type I diabetes mellitus ([place]) 07/07/2017  . Diabetic retinopathy ([place]) 06/09/2017  . Acute depression 11/02/2014  . Asthma 10/30/2013    Past Surgical History:  Procedure Laterality Date  . [facility] [place] VITRECTOMY WITH 20 GAUGE MVR PORT FOR MACULAR HOLE Left 07/20/2018   Procedure: 25 GAUGE PARS PLANA VITRECTOMY LEFT EYE;  Surgeon: Bernarda Caffey, MD;  Location: [facility];  Service: Ophthalmology;  Laterality: Left;  . CESAREAN SECTION N/A 12/26/2017   Procedure: CESAREAN SECTION;  Surgeon: Verita Schneiders  A, MD;  Location: [facility];  Service: Obstetrics;  Laterality: N/A;  . EYE EXAMINATION UNDER ANESTHESIA Right 07/20/2018   Procedure: Eye Exam Under Anesthesia RIGHT EYE;  Surgeon: Bernarda Caffey, MD;  Location: [facility];  Service: Ophthalmology;  Laterality: Right;  . EYE SURGERY Left 07/2018  . GAS  INSERTION Left 07/19/2019   Procedure: INSERTION OF GAS;  Surgeon: Bernarda Caffey, MD;  Location: [facility];  Service: Ophthalmology;  Laterality: Left;  SF6  . INJECTION OF SILICONE OIL Left 09/25/3380   Procedure: Injection Of Silicone Oil LEFT EYE;  Surgeon: Bernarda Caffey, MD;  Location: [facility];  Service: Ophthalmology;  Laterality: Left;  . LASER PHOTO ABLATION Right 07/20/2018   Procedure: Laser Photo Ablation RIGHT EYE;  Surgeon: Bernarda Caffey, MD;  Location: [facility];  Service: Ophthalmology;  Laterality: Right;  . MEMBRANE PEEL Left 07/20/2018   Procedure: Membrane Peel LEFT EYE;  Surgeon: Bernarda Caffey, MD;  Location: [facility];  Service: Ophthalmology;  Laterality: Left;  . MEMBRANE PEEL Left 07/19/2019   Procedure: MEMBRANE PEEL;  Surgeon: Bernarda Caffey, MD;  Location: [facility];  Service: Ophthalmology;  Laterality: Left;  . MITOMYCIN C APPLICATION Bilateral 5/0/5397   Procedure: Avastin Application;  Surgeon: Bernarda Caffey, MD;  Location: [facility];  Service: Ophthalmology;  Laterality: Bilateral;  . PHOTOCOAGULATION WITH LASER Left 07/20/2018   Procedure: Photocoagulation With Laser LEFT EYE;  Surgeon: Bernarda Caffey, MD;  Location: [facility];  Service: Ophthalmology;  Laterality: Left;  . PHOTOCOAGULATION WITH LASER Left 07/19/2019   Procedure: PHOTOCOAGULATION WITH LASER;  Surgeon: Bernarda Caffey, MD;  Location: [facility];  Service: Ophthalmology;  Laterality: Left;  . RETINAL DETACHMENT SURGERY Left 07/20/2018   Dr. Bernarda Caffey  . SILICON OIL REMOVAL Left 07/19/2019   Procedure: 25g PARS PLANA VITRECTOMY WITH SILICON OIL REMOVAL;  Surgeon: Bernarda Caffey, MD;  Location: [facility];  Service: Ophthalmology;  Laterality: Left;  . WISDOM TOOTH EXTRACTION       OB History    Gravida  1   Para  1   Term      Preterm  1   AB      Living  1     SAB      TAB      Ectopic      Multiple  0   Live Births  1           Family History  Problem Relation Age of Onset  . Diabetes Mother   . Aneurysm Mother 29  .  Seizures Mother   . Diabetes Father   . Cataracts Father   . COPD Father   . Heart attack Father   . Heart disease Father   . Healthy Sister   . Healthy Daughter   . Stroke Maternal Grandfather   . Amblyopia Neg Hx   . Blindness Neg Hx   . Glaucoma Neg Hx   . Macular degeneration Neg Hx   . Retinal detachment Neg Hx   . Strabismus Neg Hx   . Retinitis pigmentosa Neg Hx     Social History   Tobacco Use  . Smoking status: Never Smoker  . Smokeless tobacco: Never Used  Vaping Use  . Vaping Use: Never used  Substance Use Topics  . Alcohol use: Not Currently    Comment: SOCIALLY  . Drug use: Never    Home Medications Prior to Admission medications   Medication Sig Start Date End Date Taking? Authorizing Provider  carvedilol (COREG) 25  MG tablet Take 1 tablet (25 mg total) by mouth 2 (two) times daily. 03/07/19  Yes Imogene Burn, PA-C  diltiazem (CARDIZEM CD) 240 MG 24 hr capsule Take 1 capsule (240 mg total) by mouth daily. 03/08/19  Yes Imogene Burn, PA-C  furosemide (LASIX) 20 MG tablet Take 2 tablets (40 mg total) by mouth daily as needed for edema. Patient taking differently: Take 20 mg by mouth daily as needed for edema.  04/19/19  Yes Skeet Latch, MD  hydrALAZINE (APRESOLINE) 50 MG tablet Take 2 tablets (100 mg total) by mouth in the morning AND 1 tablet (50 mg total) daily at 12 noon AND 2 tablets (100 mg total) every evening. Patient taking differently: Take 50 mg by mouth three times daily 06/21/19  Yes Skeet Latch, MD  insulin aspart (NOVOLOG) 100 UNIT/ML injection FOR USE IN PUMP, TOTAL OF 80 UNITS PER DAY. Patient taking differently: Inject 80 Units into the skin daily. FOR USE IN PUMP, TOTAL OF 80 UNITS PER DAY. 08/08/19  Yes Renato Shin, MD  irbesartan (AVAPRO) 300 MG tablet Take 300 mg by mouth daily. 08/16/19  Yes [provider]  ondansetron (ZOFRAN-ODT) 8 MG disintegrating tablet Take 1 tablet (8 mg total) by mouth every 8 (eight) hours  as needed for nausea or vomiting. 08/14/19  Yes Jaynee Eagles, PA-C  prednisoLONE acetate (PRED FORTE) 1 % ophthalmic suspension Place 1 drop into the left eye 4 (four) times daily. 07/26/19  Yes Bernarda Caffey, MD  promethazine (PHENERGAN) 25 MG tablet Take 1 tablet (25 mg total) by mouth every 6 (six) hours as needed for nausea or vomiting. 08/14/19  Yes Kendrick, Caitlyn S, PA-C  Blood Pressure Monitoring (OMRON 3 SERIES BP MONITOR) DEVI 1 each by Does not apply route daily. Take daily blood pressure. Please report back to Dr. Radford Pax in 2 wks. 08/08/18   Sueanne Margarita, MD  Insulin Pen Needle (PEN NEEDLES) 32G X 5 MM MISC 1 Device by Does not apply route 4 (four) times daily. 03/23/18   Renato Shin, MD  metoCLOPramide (REGLAN) 10 MG tablet Take 1 tablet (10 mg total) by mouth every 8 (eight) hours as needed for nausea. Patient not taking: Reported on 08/20/2019 08/19/19   Montine Circle, PA-C  [facility] VERIO test strip USE AS DIRECTED 5 TIMES DAILY 03/09/19   Renato Shin, MD    Allergies    Patient has no known allergies.  Review of Systems   Review of Systems Ten systems are reviewed and are negative for acute change except as noted in the HPI   Physical Exam Updated Vital Signs BP (!) 191/102   Pulse 89   Temp 98.1 F (36.7 C) (Oral)   Resp 19   Ht 5\' 6"  (1.676 m)   Wt 83.9 kg   LMP 08/14/2019 (Exact Date)   SpO2 100%   BMI 29.86 kg/m   Physical Exam Constitutional:      General: She is not in acute distress.    Appearance: Normal appearance. She is well-developed. She is obese. She is ill-appearing. She is not toxic-appearing or diaphoretic.  HENT:     Head: Normocephalic and atraumatic.  Eyes:     General: Vision grossly intact. Gaze aligned appropriately.     Pupils: Pupils are equal, round, and reactive to light.  Neck:     Trachea: Trachea and phonation normal.  Pulmonary:     Effort: Pulmonary effort is normal. No respiratory distress.  Abdominal:     General:  There is  no distension.     Palpations: Abdomen is soft.     Tenderness: There is no abdominal tenderness. There is no guarding or rebound.  Musculoskeletal:        General: Normal range of motion.     Cervical back: Normal range of motion.  Skin:    General: Skin is warm and dry.  Neurological:     Mental Status: She is alert.     GCS: GCS eye subscore is 4. GCS verbal subscore is 5. GCS motor subscore is 6.     Comments: Speech is clear and goal oriented, follows commands Major Cranial nerves without deficit, no facial droop Moves extremities without ataxia, coordination intact  Psychiatric:        Behavior: Behavior normal.     ED Results / Procedures / Treatments   Labs (all labs ordered are listed, but only abnormal results are displayed) Labs Reviewed  CBC WITH DIFFERENTIAL/PLATELET - Abnormal; Notable for the following components:      Result Value   RBC 5.16 (*)    Platelets 583 (*)    Neutro Abs 9.1 (*)    All other components within normal limits  COMPREHENSIVE METABOLIC PANEL - Abnormal; Notable for the following components:   Sodium 134 (*)    CO2 21 (*)    Glucose, Bld 435 (*)    BUN 34 (*)    Creatinine, Ser 2.44 (*)    Calcium 8.6 (*)    Albumin 2.6 (*)    GFR calc non Af Amer 26 (*)    GFR calc Af Amer 30 (*)    All other components within normal limits  BLOOD GAS, VENOUS - Abnormal; Notable for the following components:   pH, Ven 7.468 (*)    pCO2, Ven 27.9 (*)    pO2, Ven 31.3 (*)    Acid-base deficit 2.1 (*)    All other components within normal limits  CBG MONITORING, ED - Abnormal; Notable for the following components:   Glucose-Capillary 414 (*)    All other components within normal limits  SARS CORONAVIRUS 2 BY RT PCR (HOSPITAL ORDER, [facility])  LIPASE, BLOOD  URINALYSIS, ROUTINE W REFLEX MICROSCOPIC  RAPID URINE DRUG SCREEN, HOSP PERFORMED  CBG MONITORING, ED  I-STAT BETA HCG BLOOD, ED ([facility], [facility], [facility] ONLY)    EKG EKG  Interpretation  Date/Time:  Monday August 20 2019 12:19:08 EDT Ventricular Rate:  92 PR Interval:    QRS Duration: 103 QT Interval:  385 QTC Calculation: 477 R Axis:   66 Text Interpretation: Sinus rhythm Prominent P waves, nondiagnostic Borderline prolonged QT interval No significant change since last tracing Confirmed by Calvert Cantor [phone]) on 08/20/2019 12:36:33 PM   Radiology No results found.  Procedures Procedures (including critical care time)  Medications Ordered in ED Medications  sodium chloride 0.9 % bolus 500 mL (has no administration in time range)  ondansetron (ZOFRAN-ODT) disintegrating tablet 4 mg (4 mg Oral Given 08/20/19 1130)  promethazine (PHENERGAN) injection 12.5 mg (12.5 mg Intravenous Given 08/20/19 1233)  sodium chloride 0.9 % bolus 500 mL (500 mLs Intravenous New Bag/Given 08/20/19 1236)  labetalol (NORMODYNE) injection 5 mg (5 mg Intravenous Given 08/20/19 1234)    ED Course  I have reviewed the triage vital signs and the nursing notes.  Pertinent labs & imaging results that were available during my care of the patient were reviewed by me and considered in my medical decision making (see  chart for details).    MDM Rules/Calculators/A&P                          Additional history obtained from: 1. Nursing notes from this visit. 2. Electronic medical record. This is patient's fourth ER presentation this week. ---------------- I ordered, reviewed and interpreted labs which include: Negative pregnancy test Lipase within normal limits, doubt pancreatitis. VBG obtained low PO2, venous gas.  No acidosis. CBC shows no leukocytosis to suggest infection and no anemia. CBG 414 CMP shows no emergent electrolyte derangement, kidney function slightly elevated from prior, no acute elevation of LFTs, no gap.  EKG: Sinus rhythm Prominent P waves, nondiagnostic Borderline prolonged QT interval No significant change since last tracing Confirmed by Calvert Cantor  [phone]) on 08/20/2019 12:36:33 PM ---------------------- Patient treated with Zofran and Phenergan reports continued nausea however vomiting has improved.  Blood pressure improved with labetalol.  Patient given fluids for dehydration.  Considering patient's failed outpatient treatment will consult hospitalist for admission.  On reassessment patient is resting comfortably no acute distress reports some improvement of symptoms, she is agreeable for admission.  Screening Covid test has been ordered.  Possible this may be due to cannabis hyperemesis syndrome, UDS is pending along with a UA. - Discussed case with Dr. Antonieta Pert, patient accepted to hospitalist service.  Note: Portions of this report may have been transcribed using voice recognition software. Every effort was made to ensure accuracy; however, inadvertent computerized transcription errors may still be present. Final Clinical Impression(s) / ED Diagnoses Final diagnoses:  Intractable nausea and vomiting    Rx / DC Orders ED Discharge Orders    None       Deliah Boston, PA-C 08/20/19 1402    Gari Crown 08/20/19 1403    Truddie Hidden, MD 08/20/19 [phone]

## 2019-08-21 LAB — BASIC METABOLIC PANEL
Anion gap: 8 (ref 5–15)
Anion gap: 7 (ref 5–15)
BUN: 31 mg/dL — ABNORMAL HIGH (ref 6–20)
BUN: 31 mg/dL — ABNORMAL HIGH (ref 6–20)
CO2: 25 mmol/L (ref 22–32)
CO2: 23 mmol/L (ref 22–32)
Calcium: 8.4 mg/dL — ABNORMAL LOW (ref 8.9–10.3)
Calcium: 7.8 mg/dL — ABNORMAL LOW (ref 8.9–10.3)
Chloride: 105 mmol/L (ref 98–111)
Chloride: 102 mmol/L (ref 98–111)
Creatinine, Ser: 2.29 mg/dL — ABNORMAL HIGH (ref 0.44–1.00)
Creatinine, Ser: 2.44 mg/dL — ABNORMAL HIGH (ref 0.44–1.00)
GFR calc Af Amer: 33 mL/min — ABNORMAL LOW (ref >60)
GFR calc Af Amer: 30 mL/min — ABNORMAL LOW (ref >60)
GFR calc non Af Amer: 28 mL/min — ABNORMAL LOW (ref >60)
GFR calc non Af Amer: 26 mL/min — ABNORMAL LOW (ref >60)
Glucose, Bld: 121 mg/dL — ABNORMAL HIGH (ref 70–99)
Glucose, Bld: 243 mg/dL — ABNORMAL HIGH (ref 70–99)
Potassium: 3.1 mmol/L — ABNORMAL LOW (ref 3.5–5.1)
Potassium: 3.2 mmol/L — ABNORMAL LOW (ref 3.5–5.1)
Sodium: 138 mmol/L (ref 135–145)
Sodium: 132 mmol/L — ABNORMAL LOW (ref 135–145)

## 2019-08-21 LAB — CBG MONITORING, ED
Glucose-Capillary: 113 mg/dL — ABNORMAL HIGH (ref 70–99)
Glucose-Capillary: 113 mg/dL — ABNORMAL HIGH (ref 70–99)
Glucose-Capillary: 108 mg/dL — ABNORMAL HIGH (ref 70–99)
Glucose-Capillary: 108 mg/dL — ABNORMAL HIGH (ref 70–99)
Glucose-Capillary: 219 mg/dL — ABNORMAL HIGH (ref 70–99)
Glucose-Capillary: 239 mg/dL — ABNORMAL HIGH (ref 70–99)

## 2019-08-21 LAB — CBC
HCT: 37 % (ref 36.0–46.0)
Hemoglobin: 11.6 g/dL — ABNORMAL LOW (ref 12.0–15.0)
MCH: 26.2 pg (ref 26.0–34.0)
MCHC: 31.4 g/dL (ref 30.0–36.0)
MCV: 83.5 fL (ref 80.0–100.0)
Platelets: 539 10*3/uL — ABNORMAL HIGH (ref 150–400)
RBC: 4.43 MIL/uL (ref 3.87–5.11)
RDW: 13.2 % (ref 11.5–15.5)
WBC: 10.8 10*3/uL — ABNORMAL HIGH (ref 4.0–10.5)
nRBC: 0 % (ref 0.0–0.2)

## 2019-08-21 MED ORDER — METOCLOPRAMIDE HCL 10 MG PO TABS
10.0000 mg | ORAL_TABLET | Freq: Three times a day (TID) | ORAL | 0 refills | Status: DC | PRN
Start: 2019-08-21 — End: 2019-08-21

## 2019-08-21 MED ORDER — METOCLOPRAMIDE HCL 10 MG PO TABS
10.0000 mg | ORAL_TABLET | Freq: Three times a day (TID) | ORAL | 0 refills | Status: DC | PRN
Start: 2019-08-21 — End: 2019-09-15

## 2019-08-21 MED ORDER — INSULIN ASPART 100 UNIT/ML ~~LOC~~ SOLN
0.0000 [IU] | Freq: Every day | SUBCUTANEOUS | Status: DC
  Filled 2019-08-21: qty 0.05

## 2019-08-21 MED ORDER — POTASSIUM CHLORIDE ER 10 MEQ PO TBCR: 20.0000 meq | EXTENDED_RELEASE_TABLET | Freq: Every day | ORAL | 0 refills | Status: DC

## 2019-08-21 MED ORDER — INSULIN ASPART 100 UNIT/ML ~~LOC~~ SOLN
0.0000 [IU] | Freq: Three times a day (TID) | SUBCUTANEOUS | Status: DC
  Administered 2019-08-21: 2 [IU] via SUBCUTANEOUS
  Filled 2019-08-21: qty 0.06

## 2019-08-21 MED ORDER — ONDANSETRON 8 MG PO TBDP
8.0000 mg | ORAL_TABLET | Freq: Three times a day (TID) | ORAL | 0 refills | Status: DC | PRN
Start: 2019-08-21 — End: 2019-11-28

## 2019-08-24 ENCOUNTER — Encounter (INDEPENDENT_AMBULATORY_CARE_PROVIDER_SITE_OTHER): Payer: BC Managed Care – PPO | Admitting: Ophthalmology

## 2019-08-29 NOTE — Discharge Summary (Signed)
Physician Discharge Summary  Barbara Donovan LNL:[id number] DOB: [birth year] DOA: 08/20/2019  PCP: Fanny Bien, MD  Admit date: 08/20/2019 Discharge date: 08/21/2019  Admitted From: Home.  Disposition: Home.   Recommendations for Outpatient Follow-up:  1. Follow up with PCP in 1-2 weeks 2. Please obtain BMP/CBC in one week Please follow up with gastroenterology in 2 weeks for gastric emptying study.   Discharge Condition:stable.  CODE STATUS:FULL CODE.  Diet recommendation: Heart Healthy / Carb Modified   Brief/Interim Summary:   Barbara Donovan is a 29 y.o. female with medical history significant for type I diabetes mellitus on insulin, obesity, hypertension, resistant hypertension, history of gastroparesis, morbid obesity to the ED for evaluation of intractable nausea and vomiting onset 2 days ago.  Patient has had intractable nausea and vomiting has tried Phenergan and Zofran without relief. Intractable nausea and vomiting the setting of diabetic gastroparesis.  Multiple ED visits for similar complaint.  Admitted the patient, for management of intractable nausea and vomiting.    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  Symptoms resolved with initiation of reglan.  She was started on clears and advanced to soft and able to tolerate without any issues.  She was adamant about leaving as she has a 7 month old baby at home.  She was discharged home on reglan and phenergan.     Type 1 Dm; Resume insulin pump.    Hyponatremia:  Improved.    Uncontrolled BP.  Resolved with resolution of symptoms.   Discharge Instructions  Discharge Instructions    Diet - low sodium heart  healthy   Complete by: As directed    Discharge instructions   Complete by: As directed    Please follow up with gastroenterology in one week. For evaluation of gastroparesis     Allergies as of 08/21/2019   No Known Allergies     Medication List    TAKE these medications   carvedilol 25 MG tablet Commonly known as: COREG Take 1 tablet (25 mg total) by mouth 2 (two) times daily.   diltiazem 240 MG 24 hr capsule Commonly known as: CARDIZEM CD Take 1 capsule (240 mg total) by mouth daily.   furosemide 20 MG tablet Commonly known as: LASIX Take 2 tablets (40 mg total) by mouth daily as needed for edema. What changed: how much to take   hydrALAZINE 50 MG tablet Commonly known as: APRESOLINE Take 2 tablets (100 mg total) by mouth in the morning AND 1 tablet (50 mg total) daily at 12 noon AND 2 tablets (100 mg total) every evening. What changed: See the new instructions.   insulin aspart 100 UNIT/ML injection Commonly known as: NovoLOG FOR USE IN PUMP, TOTAL OF 80 UNITS PER DAY. What changed:   how much to take  how to take this  when to take this   irbesartan 300 MG tablet Commonly known as: AVAPRO Take 300 mg by mouth daily.   metoCLOPramide 10 MG tablet Commonly known as: REGLAN Take 1 tablet (10 mg total) by mouth every 8 (eight) hours as needed for nausea.   Omron 3 Series BP Monitor Devi 1 each by Does not apply route daily. Take daily blood pressure. Please report  back to Dr. Radford Pax in 2 wks.   ondansetron 8 MG disintegrating tablet Commonly known as: ZOFRAN-ODT Take 1 tablet (8 mg total) by mouth every 8 (eight) hours as needed for nausea or vomiting.   OneTouch Verio test strip Generic drug: glucose blood USE AS DIRECTED 5 TIMES DAILY   Pen Needles 32G X 5 MM Misc 1 Device by Does not apply route 4 (four) times daily.   potassium chloride 10 MEQ tablet Commonly known as: KLOR-CON Take 2 tablets (20 mEq total) by mouth daily for 2 days.    prednisoLONE acetate 1 % ophthalmic suspension Commonly known as: PRED FORTE Place 1 drop into the left eye 4 (four) times daily.   promethazine 25 MG tablet Commonly known as: PHENERGAN Take 1 tablet (25 mg total) by mouth every 6 (six) hours as needed for nausea or vomiting.       Follow-up Information    Fanny Bien, MD. Schedule an appointment as soon as possible for a visit in 1 week(s).   Specialty: Family Medicine Contact information: [address] [place] [address] [phone]        Sueanne Margarita, MD .   Specialty: Cardiology Contact information: [address] [address] [phone]        [facility]. Schedule an appointment as soon as possible for a visit in 1 week(s).   Specialty: Gastroenterology Why: for evaluation of gastroparesis.  Contact information: [address] [place] [address] [phone]             No Known Allergies  Consultations:  None.    Procedures/Studies: OCT, Retina - OU - Both Eyes  Result Date: 08/12/2019 Right Eye Quality was borderline. Central Foveal Thickness: 212. Progression has been stable. Findings include no SRF, intraretinal hyper-reflective material, no IRF, normal foveal contour, vitreous traction, macular pucker, epiretinal membrane, preretinal fibrosis (stable improvement in vitreous opacities). Left Eye Quality was borderline. Central Foveal Thickness: 392. Progression has improved. Findings include abnormal foveal contour, epiretinal membrane, no SRF, outer retinal atrophy, intraretinal fluid (Interval improvement in ERM, persistent nasal macular thickening with overlying pucker). Notes *Images captured and stored on drive Diagnosis / Impression: PDR OU OD: mild preretinal fibrosis/ERM/pucker/stable improvement in vitreous opactities OS: Interval improvement in ERM, persistent nasal macular thickening with overlying pucker  Clinical management: See below Abbreviations: NFP - Normal foveal profile. CME - cystoid macular edema. PED - pigment epithelial detachment. IRF - intraretinal fluid. SRF - subretinal fluid. EZ - ellipsoid zone. ERM - epiretinal membrane. ORA - outer retinal atrophy. ORT - outer retinal tubulation. SRHM - subretinal hyper-reflective material      Subjective:  No new complaints.  Discharge Exam: Vitals:   08/21/19 1500 08/21/19 1555  BP: 126/64 126/64  Pulse: 85 85  Resp: 17 18  Temp:  98.4 F (36.9 C)  SpO2: 96% 97%   Vitals:   08/21/19 1230 08/21/19 1400 08/21/19 1500 08/21/19 1555  BP:  133/78 126/64 126/64  Pulse: 83 82 85 85  Resp: 17 16 17 18   Temp:    98.4 F (36.9 C)  TempSrc:    Oral  SpO2: 100% 98% 96% 97%  Weight:      Height:        General: Pt is alert, awake, not in acute distress Cardiovascular: RRR, S1/S2 +, no rubs, no gallops Respiratory: CTA bilaterally, no wheezing, no rhonchi Abdominal: Soft, NT, ND, bowel sounds + Extremities: no  edema, no cyanosis    The results of significant diagnostics from this hospitalization (including imaging, microbiology, ancillary and laboratory) are listed below for reference.     Microbiology: Recent Results (from the past 240 hour(s))  SARS Coronavirus 2 by RT PCR (hospital order, performed in [facility] hospital lab) Nasopharyngeal Nasopharyngeal Swab     Status: None   Collection Time: 08/20/19  1:51 PM   Specimen: Nasopharyngeal Swab  Result Value Ref Range Status   SARS Coronavirus 2 NEGATIVE NEGATIVE Final    Comment: (NOTE) SARS-CoV-2 target nucleic acids are NOT DETECTED.  The SARS-CoV-2 RNA is generally detectable in upper and lower respiratory specimens during the acute phase of infection. The lowest concentration of SARS-CoV-2 viral copies this assay can detect is 250 copies / mL. A negative result does not preclude SARS-CoV-2 infection and should not be used as the sole basis for treatment or  other patient management decisions.  A negative result may occur with improper specimen collection / handling, submission of specimen other than nasopharyngeal swab, presence of viral mutation(s) within the areas targeted by this assay, and inadequate number of viral copies (<250 copies / mL). A negative result must be combined with clinical observations, patient history, and epidemiological information.  Fact Sheet for Patients:   StrictlyIdeas.no  Fact Sheet for Healthcare Providers: BankingDealers.co.za  This test is not yet approved or  cleared by the [place] FDA and has been authorized for detection and/or diagnosis of SARS-CoV-2 by FDA under an Emergency Use Authorization (EUA).  This EUA will remain in effect (meaning this test can be used) for the duration of the COVID-19 declaration under Section 564(b)(1) of the Act, 21 U.S.C. section 360bbb-3(b)(1), unless the authorization is terminated or revoked sooner.  Performed at [facility], [place] [address] [place], [place] [address]      Labs: BNP (last 3 results) No results for input(s): BNP in the last 8760 hours. Basic Metabolic Panel: No results for input(s): NA, K, CL, CO2, GLUCOSE, BUN, CREATININE, CALCIUM, MG, PHOS in the last 168 hours. Liver Function Tests: No results for input(s): AST, ALT, ALKPHOS, BILITOT, PROT, ALBUMIN in the last 168 hours. No results for input(s): LIPASE, AMYLASE in the last 168 hours. No results for input(s): AMMONIA in the last 168 hours. CBC: No results for input(s): WBC, NEUTROABS, HGB, HCT, MCV, PLT in the last 168 hours. Cardiac Enzymes: No results for input(s): CKTOTAL, CKMB, CKMBINDEX, TROPONINI in the last 168 hours. BNP: Invalid input(s): POCBNP CBG: No results for input(s): GLUCAP in the last 168 hours. D-Dimer No results for input(s): DDIMER in the last 72 hours. Hgb A1c No results for input(s): HGBA1C  in the last 72 hours. Lipid Profile No results for input(s): CHOL, HDL, LDLCALC, TRIG, CHOLHDL, LDLDIRECT in the last 72 hours. Thyroid function studies No results for input(s): TSH, T4TOTAL, T3FREE, THYROIDAB in the last 72 hours.  Invalid input(s): FREET3 Anemia work up No results for input(s): VITAMINB12, FOLATE, FERRITIN, TIBC, IRON, RETICCTPCT in the last 72 hours. Urinalysis    Component Value Date/Time   COLORURINE YELLOW 08/20/2019 1351   APPEARANCEUR CLEAR 08/20/2019 1351   LABSPEC 1.017 08/20/2019 1351   PHURINE 6.0 08/20/2019 1351   GLUCOSEU >=500 (A) 08/20/2019 1351   HGBUR MODERATE (A) 08/20/2019 1351   BILIRUBINUR NEGATIVE 08/20/2019 1351   KETONESUR 20 (A) 08/20/2019 1351   PROTEINUR >=300 (A) 08/20/2019 1351   UROBILINOGEN 0.2 08/14/2019 1051   NITRITE NEGATIVE 08/20/2019 1351   LEUKOCYTESUR  NEGATIVE 08/20/2019 1351   Sepsis Labs Invalid input(s): PROCALCITONIN,  WBC,  LACTICIDVEN Microbiology Recent Results (from the past 240 hour(s))  SARS Coronavirus 2 by RT PCR (hospital order, performed in [facility] hospital lab) Nasopharyngeal Nasopharyngeal Swab     Status: None   Collection Time: 08/20/19  1:51 PM   Specimen: Nasopharyngeal Swab  Result Value Ref Range Status   SARS Coronavirus 2 NEGATIVE NEGATIVE Final    Comment: (NOTE) SARS-CoV-2 target nucleic acids are NOT DETECTED.  The SARS-CoV-2 RNA is generally detectable in upper and lower respiratory specimens during the acute phase of infection. The lowest concentration of SARS-CoV-2 viral copies this assay can detect is 250 copies / mL. A negative result does not preclude SARS-CoV-2 infection and should not be used as the sole basis for treatment or other patient management decisions.  A negative result may occur with improper specimen collection / handling, submission of specimen other than nasopharyngeal swab, presence of viral mutation(s) within the areas targeted by this assay, and inadequate  number of viral copies (<250 copies / mL). A negative result must be combined with clinical observations, patient history, and epidemiological information.  Fact Sheet for Patients:   StrictlyIdeas.no  Fact Sheet for Healthcare Providers: BankingDealers.co.za  This test is not yet approved or  cleared by the [place] FDA and has been authorized for detection and/or diagnosis of SARS-CoV-2 by FDA under an Emergency Use Authorization (EUA).  This EUA will remain in effect (meaning this test can be used) for the duration of the COVID-19 declaration under Section 564(b)(1) of the Act, 21 U.S.C. section 360bbb-3(b)(1), unless the authorization is terminated or revoked sooner.  Performed at [facility], [place] [address], [place], [place] [address]      Time coordinating discharge: 32 minutes  SIGNED:   Hosie Poisson, MD  [facility]

## 2019-09-14 ENCOUNTER — Other Ambulatory Visit: Payer: Self-pay

## 2019-09-14 ENCOUNTER — Encounter (HOSPITAL_COMMUNITY): Payer: Self-pay | Admitting: *Deleted

## 2019-09-14 ENCOUNTER — Inpatient Hospital Stay (HOSPITAL_COMMUNITY)
Admission: EM | Admit: 2019-09-14 | Discharge: 2019-09-15 | DRG: 919 | Disposition: A | Discharge status: Home / Self Care | Payer: Self-pay | Attending: Internal Medicine | Admitting: Internal Medicine

## 2019-09-14 ENCOUNTER — Inpatient Hospital Stay (HOSPITAL_COMMUNITY): Payer: Self-pay

## 2019-09-14 DIAGNOSIS — Z833 Family history of diabetes mellitus: Secondary | ICD-10-CM

## 2019-09-14 DIAGNOSIS — I160 Hypertensive urgency: Secondary | ICD-10-CM | POA: Diagnosis present

## 2019-09-14 DIAGNOSIS — E1110 Type 2 diabetes mellitus with ketoacidosis without coma: Secondary | ICD-10-CM | POA: Diagnosis present

## 2019-09-14 DIAGNOSIS — Z8249 Family history of ischemic heart disease and other diseases of the circulatory system: Secondary | ICD-10-CM

## 2019-09-14 DIAGNOSIS — T85694A Other mechanical complication of insulin pump, initial encounter: Principal | ICD-10-CM | POA: Diagnosis present

## 2019-09-14 DIAGNOSIS — K3184 Gastroparesis: Secondary | ICD-10-CM | POA: Diagnosis present

## 2019-09-14 DIAGNOSIS — E860 Dehydration: Secondary | ICD-10-CM | POA: Diagnosis present

## 2019-09-14 DIAGNOSIS — E11319 Type 2 diabetes mellitus with unspecified diabetic retinopathy without macular edema: Secondary | ICD-10-CM | POA: Diagnosis present

## 2019-09-14 DIAGNOSIS — E1010 Type 1 diabetes mellitus with ketoacidosis without coma: Secondary | ICD-10-CM | POA: Diagnosis present

## 2019-09-14 DIAGNOSIS — R112 Nausea with vomiting, unspecified: Secondary | ICD-10-CM | POA: Diagnosis present

## 2019-09-14 DIAGNOSIS — T383X6A Underdosing of insulin and oral hypoglycemic [antidiabetic] drugs, initial encounter: Secondary | ICD-10-CM | POA: Diagnosis present

## 2019-09-14 DIAGNOSIS — E1043 Type 1 diabetes mellitus with diabetic autonomic (poly)neuropathy: Secondary | ICD-10-CM | POA: Diagnosis present

## 2019-09-14 DIAGNOSIS — Z794 Long term (current) use of insulin: Secondary | ICD-10-CM

## 2019-09-14 DIAGNOSIS — E1022 Type 1 diabetes mellitus with diabetic chronic kidney disease: Secondary | ICD-10-CM | POA: Diagnosis present

## 2019-09-14 DIAGNOSIS — I10 Essential (primary) hypertension: Secondary | ICD-10-CM | POA: Diagnosis present

## 2019-09-14 DIAGNOSIS — E876 Hypokalemia: Secondary | ICD-10-CM | POA: Diagnosis present

## 2019-09-14 DIAGNOSIS — Z79899 Other long term (current) drug therapy: Secondary | ICD-10-CM

## 2019-09-14 DIAGNOSIS — E10319 Type 1 diabetes mellitus with unspecified diabetic retinopathy without macular edema: Secondary | ICD-10-CM | POA: Diagnosis present

## 2019-09-14 DIAGNOSIS — N1832 Chronic kidney disease, stage 3b: Secondary | ICD-10-CM | POA: Diagnosis present

## 2019-09-14 DIAGNOSIS — E1029 Type 1 diabetes mellitus with other diabetic kidney complication: Secondary | ICD-10-CM | POA: Diagnosis present

## 2019-09-14 DIAGNOSIS — Y92009 Unspecified place in unspecified non-institutional (private) residence as the place of occurrence of the external cause: Secondary | ICD-10-CM

## 2019-09-14 DIAGNOSIS — R809 Proteinuria, unspecified: Secondary | ICD-10-CM | POA: Diagnosis present

## 2019-09-14 DIAGNOSIS — U071 COVID-19: Secondary | ICD-10-CM | POA: Diagnosis present

## 2019-09-14 DIAGNOSIS — I129 Hypertensive chronic kidney disease with stage 1 through stage 4 chronic kidney disease, or unspecified chronic kidney disease: Secondary | ICD-10-CM | POA: Diagnosis present

## 2019-09-14 DIAGNOSIS — T465X6A Underdosing of other antihypertensive drugs, initial encounter: Secondary | ICD-10-CM | POA: Diagnosis present

## 2019-09-14 DIAGNOSIS — Z91128 Patient's intentional underdosing of medication regimen for other reason: Secondary | ICD-10-CM

## 2019-09-14 DIAGNOSIS — Z9114 Patient's other noncompliance with medication regimen: Secondary | ICD-10-CM

## 2019-09-14 LAB — TRIGLYCERIDES: Triglycerides: 128 mg/dL (ref <150)

## 2019-09-14 LAB — COMPREHENSIVE METABOLIC PANEL
ALT: 50 U/L — ABNORMAL HIGH (ref 0–44)
AST: 29 U/L (ref 15–41)
Albumin: 2.3 g/dL — ABNORMAL LOW (ref 3.5–5.0)
Alkaline Phosphatase: 82 U/L (ref 38–126)
Anion gap: 19 — ABNORMAL HIGH (ref 5–15)
BUN: 20 mg/dL (ref 6–20)
CO2: 20 mmol/L — ABNORMAL LOW (ref 22–32)
Calcium: 8.8 mg/dL — ABNORMAL LOW (ref 8.9–10.3)
Chloride: 98 mmol/L (ref 98–111)
Creatinine, Ser: 2.44 mg/dL — ABNORMAL HIGH (ref 0.44–1.00)
GFR calc Af Amer: 30 mL/min — ABNORMAL LOW (ref >60)
GFR calc non Af Amer: 26 mL/min — ABNORMAL LOW (ref >60)
Glucose, Bld: 322 mg/dL — ABNORMAL HIGH (ref 70–99)
Potassium: 2.8 mmol/L — ABNORMAL LOW (ref 3.5–5.1)
Sodium: 137 mmol/L (ref 135–145)
Total Bilirubin: 0.7 mg/dL (ref 0.3–1.2)
Total Protein: 7.2 g/dL (ref 6.5–8.1)

## 2019-09-14 LAB — CBC
HCT: 42 % (ref 36.0–46.0)
Hemoglobin: 13.1 g/dL (ref 12.0–15.0)
MCH: 25.2 pg — ABNORMAL LOW (ref 26.0–34.0)
MCHC: 31.2 g/dL (ref 30.0–36.0)
MCV: 80.9 fL (ref 80.0–100.0)
Platelets: 643 10*3/uL — ABNORMAL HIGH (ref 150–400)
RBC: 5.19 MIL/uL — ABNORMAL HIGH (ref 3.87–5.11)
RDW: 13.5 % (ref 11.5–15.5)
WBC: 9 10*3/uL (ref 4.0–10.5)
nRBC: 0 % (ref 0.0–0.2)

## 2019-09-14 LAB — BLOOD GAS, VENOUS
Acid-Base Excess: 0.2 mmol/L (ref 0.0–2.0)
Bicarbonate: 24.1 mmol/L (ref 20.0–28.0)
FIO2: 21
O2 Saturation: 61.9 %
Patient temperature: 98.6
pCO2, Ven: 38.5 mmHg — ABNORMAL LOW (ref 44.0–60.0)
pH, Ven: 7.412 (ref 7.250–7.430)
pO2, Ven: 36.4 mmHg (ref 32.0–45.0)

## 2019-09-14 LAB — BASIC METABOLIC PANEL
Anion gap: 12 (ref 5–15)
Anion gap: 11 (ref 5–15)
Anion gap: 11 (ref 5–15)
BUN: 20 mg/dL (ref 6–20)
BUN: 19 mg/dL (ref 6–20)
BUN: 20 mg/dL (ref 6–20)
CO2: 22 mmol/L (ref 22–32)
CO2: 19 mmol/L — ABNORMAL LOW (ref 22–32)
CO2: 21 mmol/L — ABNORMAL LOW (ref 22–32)
Calcium: 8.1 mg/dL — ABNORMAL LOW (ref 8.9–10.3)
Calcium: 7.8 mg/dL — ABNORMAL LOW (ref 8.9–10.3)
Calcium: 8 mg/dL — ABNORMAL LOW (ref 8.9–10.3)
Chloride: 102 mmol/L (ref 98–111)
Chloride: 103 mmol/L (ref 98–111)
Chloride: 103 mmol/L (ref 98–111)
Creatinine, Ser: 2.39 mg/dL — ABNORMAL HIGH (ref 0.44–1.00)
Creatinine, Ser: 2.18 mg/dL — ABNORMAL HIGH (ref 0.44–1.00)
Creatinine, Ser: 2.39 mg/dL — ABNORMAL HIGH (ref 0.44–1.00)
GFR calc Af Amer: 31 mL/min — ABNORMAL LOW (ref >60)
GFR calc Af Amer: 35 mL/min — ABNORMAL LOW (ref >60)
GFR calc Af Amer: 31 mL/min — ABNORMAL LOW (ref >60)
GFR calc non Af Amer: 27 mL/min — ABNORMAL LOW (ref >60)
GFR calc non Af Amer: 30 mL/min — ABNORMAL LOW (ref >60)
GFR calc non Af Amer: 27 mL/min — ABNORMAL LOW (ref >60)
Glucose, Bld: 200 mg/dL — ABNORMAL HIGH (ref 70–99)
Glucose, Bld: 206 mg/dL — ABNORMAL HIGH (ref 70–99)
Glucose, Bld: 217 mg/dL — ABNORMAL HIGH (ref 70–99)
Potassium: 2.8 mmol/L — ABNORMAL LOW (ref 3.5–5.1)
Potassium: 3.3 mmol/L — ABNORMAL LOW (ref 3.5–5.1)
Potassium: 2.9 mmol/L — ABNORMAL LOW (ref 3.5–5.1)
Sodium: 136 mmol/L (ref 135–145)
Sodium: 133 mmol/L — ABNORMAL LOW (ref 135–145)
Sodium: 135 mmol/L (ref 135–145)

## 2019-09-14 LAB — BETA-HYDROXYBUTYRIC ACID
Beta-Hydroxybutyric Acid: 2.17 mmol/L — ABNORMAL HIGH (ref 0.05–0.27)
Beta-Hydroxybutyric Acid: 0.26 mmol/L (ref 0.05–0.27)

## 2019-09-14 LAB — CBG MONITORING, ED
Glucose-Capillary: 314 mg/dL — ABNORMAL HIGH (ref 70–99)
Glucose-Capillary: 290 mg/dL — ABNORMAL HIGH (ref 70–99)
Glucose-Capillary: 190 mg/dL — ABNORMAL HIGH (ref 70–99)
Glucose-Capillary: 184 mg/dL — ABNORMAL HIGH (ref 70–99)
Glucose-Capillary: 183 mg/dL — ABNORMAL HIGH (ref 70–99)
Glucose-Capillary: 187 mg/dL — ABNORMAL HIGH (ref 70–99)
Glucose-Capillary: 184 mg/dL — ABNORMAL HIGH (ref 70–99)
Glucose-Capillary: 240 mg/dL — ABNORMAL HIGH (ref 70–99)
Glucose-Capillary: 232 mg/dL — ABNORMAL HIGH (ref 70–99)
Glucose-Capillary: 172 mg/dL — ABNORMAL HIGH (ref 70–99)
Glucose-Capillary: 189 mg/dL — ABNORMAL HIGH (ref 70–99)

## 2019-09-14 LAB — PROCALCITONIN: Procalcitonin: <0.1 ng/mL

## 2019-09-14 LAB — URINALYSIS, ROUTINE W REFLEX MICROSCOPIC
Bilirubin Urine: NEGATIVE
Glucose, UA: >=500 mg/dL — AB
Ketones, ur: 20 mg/dL — AB
Leukocytes,Ua: NEGATIVE
Nitrite: NEGATIVE
Protein, ur: >=300 mg/dL — AB
Specific Gravity, Urine: 1.013 (ref 1.005–1.030)
pH: 7 (ref 5.0–8.0)

## 2019-09-14 LAB — I-STAT BETA HCG BLOOD, ED (MC, WL, AP ONLY): I-stat hCG, quantitative: <5 m[IU]/mL (ref <5)

## 2019-09-14 LAB — SEDIMENTATION RATE: Sed Rate: 87 mm/hr — ABNORMAL HIGH (ref 0–22)

## 2019-09-14 LAB — C-REACTIVE PROTEIN: CRP: <0.5 mg/dL (ref <1.0)

## 2019-09-14 LAB — SARS CORONAVIRUS 2 BY RT PCR (HOSPITAL ORDER, PERFORMED IN ~~LOC~~ HOSPITAL LAB): SARS Coronavirus 2: POSITIVE — AB

## 2019-09-14 LAB — LACTATE DEHYDROGENASE: LDH: 486 U/L — ABNORMAL HIGH (ref 98–192)

## 2019-09-14 LAB — FERRITIN: Ferritin: 24 ng/mL (ref 11–307)

## 2019-09-14 LAB — MAGNESIUM: Magnesium: 1.8 mg/dL (ref 1.7–2.4)

## 2019-09-14 LAB — LIPASE, BLOOD: Lipase: 28 U/L (ref 11–51)

## 2019-09-14 LAB — ABO/RH: ABO/RH(D): A POS

## 2019-09-14 LAB — D-DIMER, QUANTITATIVE: D-Dimer, Quant: 1.27 ug/mL-FEU — ABNORMAL HIGH (ref 0.00–0.50)

## 2019-09-14 LAB — FIBRINOGEN: Fibrinogen: 676 mg/dL — ABNORMAL HIGH (ref 210–475)

## 2019-09-14 IMAGING — DX DG CHEST 1V PORT
1 series · 1 of 1 positions shown · non-contrast
Comparison: [DATE].

CLINICAL DATA: Nausea and vomiting with hypertension. [A0]
positive

EXAM:
PORTABLE CHEST 1 VIEW

[chest ap]
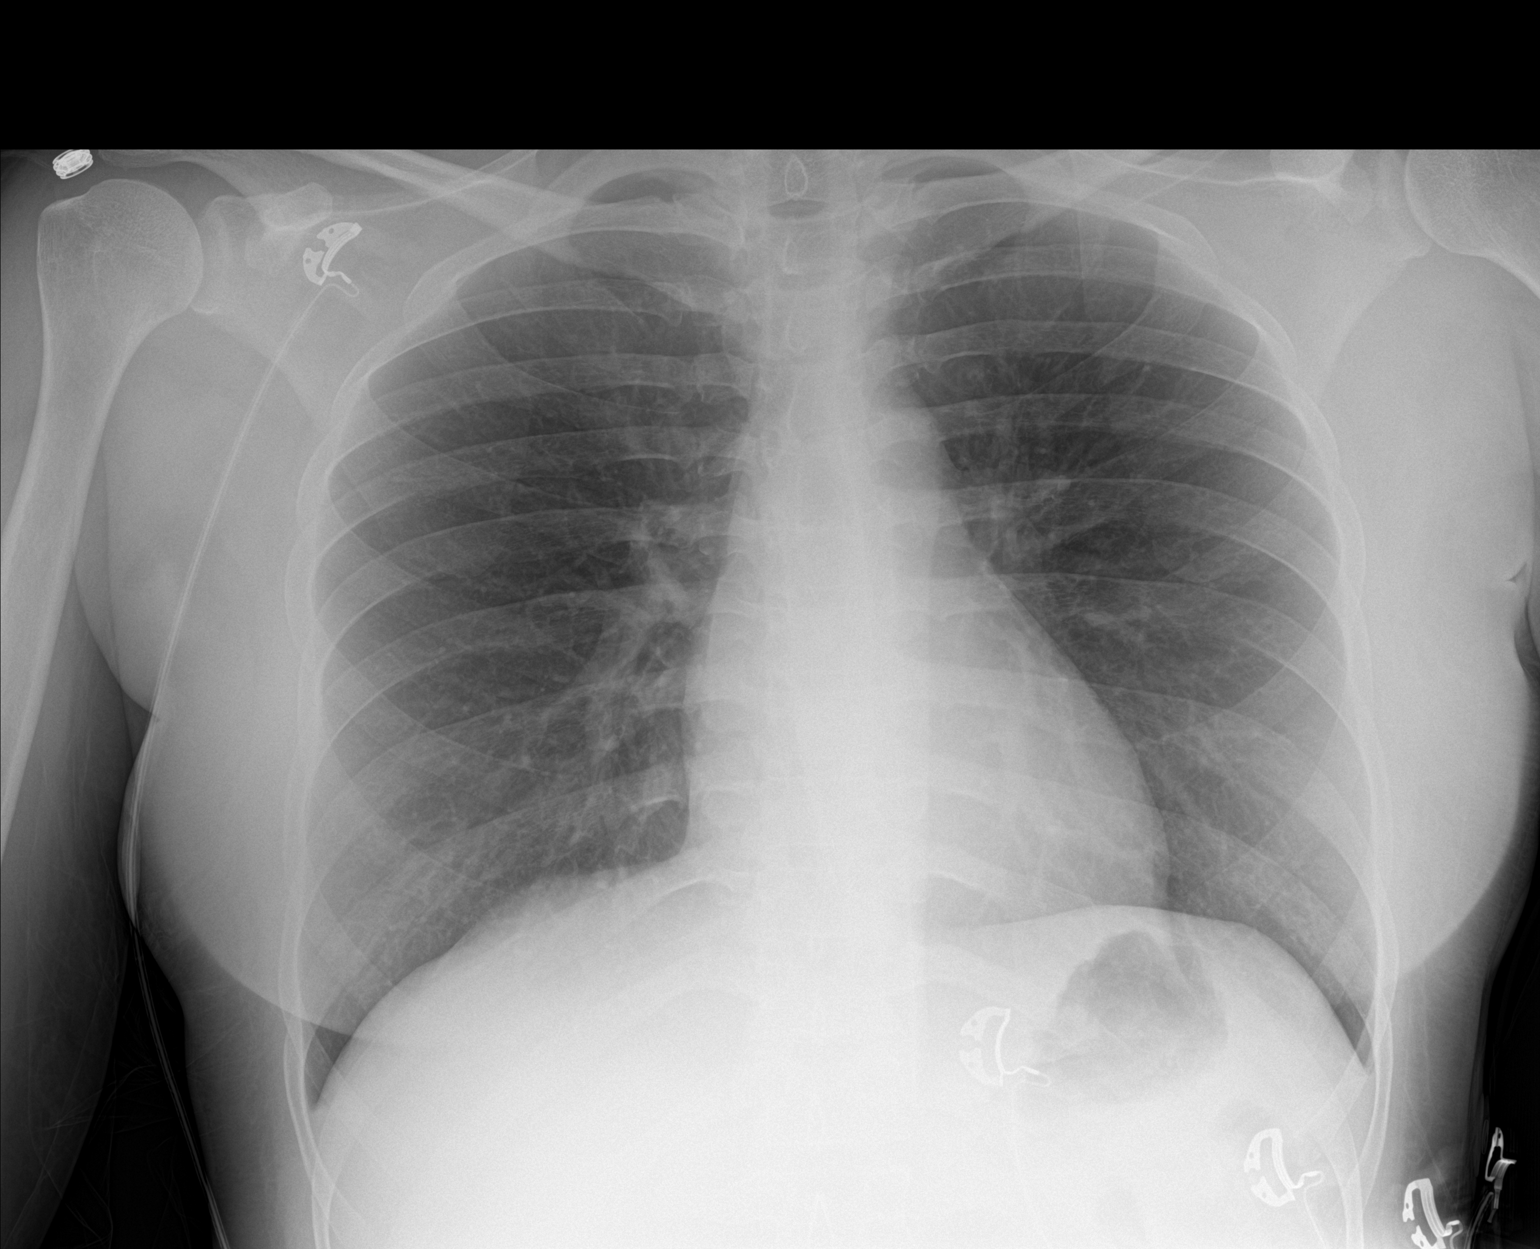

[1 of 1 positions shown; findings below may reference images not displayed]

FINDINGS: Lungs are clear. Heart size and pulmonary vascularity are normal. No
adenopathy. No bone lesions.
IMPRESSION: Lungs clear.  Cardiac silhouette normal.

## 2019-09-14 MED ORDER — SODIUM CHLORIDE 0.9 % IV BOLUS
1000.0000 mL | Freq: Once | INTRAVENOUS | Status: AC
  Administered 2019-09-14: 1000 mL via INTRAVENOUS

## 2019-09-14 MED ORDER — METOCLOPRAMIDE HCL 10 MG PO TABS
10.0000 mg | ORAL_TABLET | Freq: Three times a day (TID) | ORAL | Status: DC | PRN
  Administered 2019-09-14: 10 mg via ORAL
  Filled 2019-09-14: qty 1

## 2019-09-14 MED ORDER — INSULIN GLARGINE 100 UNIT/ML ~~LOC~~ SOLN
15.0000 [IU] | Freq: Every day | SUBCUTANEOUS | Status: DC
  Administered 2019-09-14 – 2019-09-15 (×2): 15 [IU] via SUBCUTANEOUS
  Filled 2019-09-14 (×2): qty 0.15

## 2019-09-14 MED ORDER — ZINC SULFATE 220 (50 ZN) MG PO CAPS
220.0000 mg | ORAL_CAPSULE | Freq: Every day | ORAL | Status: DC
  Administered 2019-09-14 – 2019-09-15 (×2): 220 mg via ORAL
  Filled 2019-09-14 (×2): qty 1

## 2019-09-14 MED ORDER — LACTATED RINGERS IV SOLN: INTRAVENOUS | Status: DC

## 2019-09-14 MED ORDER — DEXTROSE IN LACTATED RINGERS 5 % IV SOLN: INTRAVENOUS | Status: DC

## 2019-09-14 MED ORDER — CARVEDILOL 12.5 MG PO TABS
25.0000 mg | ORAL_TABLET | Freq: Two times a day (BID) | ORAL | Status: DC
  Administered 2019-09-14: 25 mg via ORAL
  Filled 2019-09-14: qty 2

## 2019-09-14 MED ORDER — METOPROLOL TARTRATE 5 MG/5ML IV SOLN
5.0000 mg | Freq: Four times a day (QID) | INTRAVENOUS | Status: DC
  Administered 2019-09-14 – 2019-09-15 (×3): 5 mg via INTRAVENOUS
  Filled 2019-09-14 (×3): qty 5

## 2019-09-14 MED ORDER — HYDROCOD POLST-CPM POLST ER 10-8 MG/5ML PO SUER: 5.0000 mL | Freq: Two times a day (BID) | ORAL | Status: DC | PRN

## 2019-09-14 MED ORDER — ACETAMINOPHEN 325 MG PO TABS
650.0000 mg | ORAL_TABLET | Freq: Once | ORAL | Status: AC
  Administered 2019-09-14: 650 mg via ORAL
  Filled 2019-09-14: qty 2

## 2019-09-14 MED ORDER — INSULIN ASPART 100 UNIT/ML ~~LOC~~ SOLN
0.0000 [IU] | Freq: Every day | SUBCUTANEOUS | Status: DC
  Filled 2019-09-14: qty 0.05

## 2019-09-14 MED ORDER — ONDANSETRON HCL 4 MG/2ML IJ SOLN
4.0000 mg | Freq: Once | INTRAMUSCULAR | Status: AC
  Administered 2019-09-14: 4 mg via INTRAVENOUS
  Filled 2019-09-14: qty 2

## 2019-09-14 MED ORDER — METOCLOPRAMIDE HCL 5 MG/ML IJ SOLN
10.0000 mg | Freq: Three times a day (TID) | INTRAMUSCULAR | Status: DC
  Administered 2019-09-14 – 2019-09-15 (×4): 10 mg via INTRAVENOUS
  Filled 2019-09-14 (×4): qty 2

## 2019-09-14 MED ORDER — HEPARIN SODIUM (PORCINE) 5000 UNIT/ML IJ SOLN
5000.0000 [IU] | Freq: Three times a day (TID) | INTRAMUSCULAR | Status: DC
  Administered 2019-09-14 – 2019-09-15 (×3): 5000 [IU] via SUBCUTANEOUS
  Filled 2019-09-14 (×3): qty 1

## 2019-09-14 MED ORDER — POTASSIUM CHLORIDE 10 MEQ/100ML IV SOLN
10.0000 meq | Freq: Once | INTRAVENOUS | Status: AC
  Administered 2019-09-14: 10 meq via INTRAVENOUS
  Filled 2019-09-14: qty 100

## 2019-09-14 MED ORDER — DEXTROSE 50 % IV SOLN: 0.0000 mL | INTRAVENOUS | Status: DC | PRN

## 2019-09-14 MED ORDER — METOPROLOL TARTRATE 5 MG/5ML IV SOLN: 5.0000 mg | Freq: Four times a day (QID) | INTRAVENOUS | Status: DC

## 2019-09-14 MED ORDER — ENALAPRILAT 1.25 MG/ML IV SOLN
0.6250 mg | Freq: Four times a day (QID) | INTRAVENOUS | Status: DC
  Administered 2019-09-14 – 2019-09-15 (×3): 0.625 mg via INTRAVENOUS
  Filled 2019-09-14 (×4): qty 0.5

## 2019-09-14 MED ORDER — ASCORBIC ACID 500 MG PO TABS
500.0000 mg | ORAL_TABLET | Freq: Every day | ORAL | Status: DC
  Administered 2019-09-14 – 2019-09-15 (×2): 500 mg via ORAL
  Filled 2019-09-14 (×2): qty 1

## 2019-09-14 MED ORDER — POTASSIUM CHLORIDE 10 MEQ/100ML IV SOLN
10.0000 meq | INTRAVENOUS | Status: AC
  Administered 2019-09-14 (×3): 10 meq via INTRAVENOUS
  Filled 2019-09-14 (×3): qty 100

## 2019-09-14 MED ORDER — GUAIFENESIN-DM 100-10 MG/5ML PO SYRP: 10.0000 mL | ORAL_SOLUTION | ORAL | Status: DC | PRN

## 2019-09-14 MED ORDER — INSULIN ASPART 100 UNIT/ML ~~LOC~~ SOLN
7.0000 [IU] | Freq: Three times a day (TID) | SUBCUTANEOUS | Status: DC
  Administered 2019-09-15 (×2): 7 [IU] via SUBCUTANEOUS
  Filled 2019-09-14: qty 0.07

## 2019-09-14 MED ORDER — INSULIN REGULAR(HUMAN) IN NACL 100-0.9 UT/100ML-% IV SOLN
INTRAVENOUS | Status: DC
  Administered 2019-09-14: 7 [IU]/h via INTRAVENOUS
  Filled 2019-09-14: qty 100

## 2019-09-14 MED ORDER — HYDRALAZINE HCL 20 MG/ML IJ SOLN
10.0000 mg | INTRAMUSCULAR | Status: DC | PRN
  Administered 2019-09-14 – 2019-09-15 (×2): 10 mg via INTRAVENOUS
  Filled 2019-09-14 (×2): qty 1

## 2019-09-14 MED ORDER — LABETALOL HCL 5 MG/ML IV SOLN
10.0000 mg | Freq: Once | INTRAVENOUS | Status: AC
  Administered 2019-09-14: 10 mg via INTRAVENOUS
  Filled 2019-09-14: qty 4

## 2019-09-14 MED ORDER — IRBESARTAN 300 MG PO TABS
300.0000 mg | ORAL_TABLET | Freq: Every day | ORAL | Status: DC
  Administered 2019-09-14: 300 mg via ORAL
  Filled 2019-09-14: qty 1

## 2019-09-14 MED ORDER — HYDRALAZINE HCL 25 MG PO TABS
50.0000 mg | ORAL_TABLET | Freq: Three times a day (TID) | ORAL | Status: DC
  Administered 2019-09-14: 50 mg via ORAL
  Filled 2019-09-14: qty 2

## 2019-09-14 MED ORDER — LACTATED RINGERS IV BOLUS
20.0000 mL/kg | Freq: Once | INTRAVENOUS | Status: AC
  Administered 2019-09-14: 1724 mL via INTRAVENOUS

## 2019-09-14 MED ORDER — INSULIN ASPART 100 UNIT/ML ~~LOC~~ SOLN
0.0000 [IU] | Freq: Three times a day (TID) | SUBCUTANEOUS | Status: DC
  Administered 2019-09-15 (×2): 2 [IU] via SUBCUTANEOUS
  Filled 2019-09-14: qty 0.09

## 2019-09-14 MED ORDER — DILTIAZEM HCL ER COATED BEADS 120 MG PO CP24
240.0000 mg | ORAL_CAPSULE | Freq: Every day | ORAL | Status: DC
  Administered 2019-09-14: 240 mg via ORAL
  Filled 2019-09-14: qty 2

## 2019-09-14 MED ORDER — ALBUTEROL SULFATE HFA 108 (90 BASE) MCG/ACT IN AERS
2.0000 | INHALATION_SPRAY | Freq: Four times a day (QID) | RESPIRATORY_TRACT | Status: DC
  Administered 2019-09-14 – 2019-09-15 (×5): 2 via RESPIRATORY_TRACT
  Filled 2019-09-14 (×2): qty 6.7

## 2019-09-14 MED ORDER — ONDANSETRON HCL 4 MG/2ML IJ SOLN
4.0000 mg | Freq: Four times a day (QID) | INTRAMUSCULAR | Status: DC | PRN
  Administered 2019-09-14: 4 mg via INTRAVENOUS
  Filled 2019-09-14: qty 2

## 2019-09-14 NOTE — ED Notes (Signed)
Hospitalist, Dr. Wyline Copas, made aware that BMP resulted and revealed an anion gap of 12. Hospitalist stated that he would look at her results and determine if she can be taken of the insulin drip.

## 2019-09-14 NOTE — Progress Notes (Signed)
Inpatient Diabetes Program Recommendations  AACE/ADA: New Consensus Statement on Inpatient Glycemic Control (2015)  Target Ranges:  Prepandial:   less than 140 mg/dL      Peak postprandial:   less than 180 mg/dL (1-2 hours)      Critically ill patients:  140 - 180 mg/dL   Lab Results  Component Value Date   GLUCAP 190 (H) 09/14/2019   HGBA1C 8.0 (A) 08/08/2019    Review of Glycemic Control  Diabetes history: DM type 1 Outpatient Diabetes medications: Insulin pump  Basal rate  2.5 units/hour 7A-9P 0.8 units/hour 9P-7A Target glucose : 100 Sensitivity 1 units drops glucose 30 points Current orders for Inpatient glycemic control: IV insulin  Inpatient Diabetes Program Recommendations:    - consider ordering BMETs Q4.  Pt was here beginning of the month for hyperglycemia/DKA. Sees Dr. Renato Shin with [facility]. Last visit 08/08/19 A1c was 8% at that time.   Tried calling in pts room, she is actively vomiting will try again later today to inquire the reasons for her taking her insulin pump off and not taking insulin or replacing pump.  Thanks,  Tama Headings RN, MSN, BC-ADM Inpatient Diabetes Coordinator Team Pager [phone] (8a-5p)

## 2019-09-14 NOTE — ED Notes (Signed)
This RN saw hospitalist, Dr. Wyline Copas, at a nurses station and asked if patient qualified to be taken off the insulin drip. MD reports he will look at her labs and determine if she can be taken off the insulin drip.

## 2019-09-14 NOTE — ED Triage Notes (Signed)
Pt states she has been vomiting 2 days. Pt says that she took off her insulin pump 2 days ago to change the cartridge and she never put it back on. C/o CP and nausea. Used home antiemetics without relief.

## 2019-09-14 NOTE — Progress Notes (Signed)
IV team no longer needed for IV start floor RN was able to obtain access

## 2019-09-14 NOTE — ED Notes (Signed)
This RN messaged hospitalist, Dr. Wyline Copas, about potentially taking the patient off of the insulin drip. MD read the message and did not respond.

## 2019-09-14 NOTE — H&P (Signed)
History and Physical    Barbara Donovan QVZ:[id number] DOB: 05-Dec-1990 DOA: 09/14/2019  PCP: Fanny Bien, MD  Patient coming from: Home  Chief Complaint: Intractable nausea vomiting  HPI: Barbara Donovan is a 29 y.o. female with medical history significant of type 1 diabetes on insulin pump, stage III CKD, hypertension who presents to the department with intractable nausea vomiting, unable to tolerate even her home medications orally.  Patient was recently discharged for intractable nausea vomiting thought to be gastroparesis versus gastroenteritis earlier this month.  On further questioning, symptoms started several days prior to this hospital visit.  Patient was due to change cartridge out from her insulin pump however never reassembled it secondary to feeling bad thus was without insulin for the past 2 to 3 days.  Patient did try antiemetics without much success.  She subsequently presented to emergency department for further work-up  Of note, patient has completed both doses of her Pfeizer Covid vaccine as of 1 week prior to this hospital visit.  No known sick contacts.  Patient resides with her boyfriend who is not vaccinated and without symptoms  ED Course: In the emergency department, patient was noted to have a presenting glucose of 322 with a potassium of 2.8, calculated anion gap was 19.  UA was notable for greater than 500 glucose with positive ketones.  Patient was started on insulin drip and IV fluid hydration.  In addition, Covid test was found to be positive.  Chest x-ray and inflammatory markers are pending at the time of this dictation.  Hospitalist service consulted for consideration for medical admission.  Review of Systems:  Review of Systems  Constitutional: Positive for malaise/fatigue. Negative for chills.  HENT: Negative for ear discharge, ear pain and nosebleeds.   Eyes: Negative for double vision, photophobia and pain.  Respiratory: Negative for hemoptysis and  shortness of breath.   Cardiovascular: Negative for palpitations, orthopnea and claudication.  Gastrointestinal: Positive for nausea and vomiting. Negative for constipation.  Genitourinary: Negative for frequency, hematuria and urgency.  Musculoskeletal: Negative for back pain, joint pain and neck pain.  Neurological: Negative for tremors, sensory change and loss of consciousness.  Psychiatric/Behavioral: Negative for hallucinations, memory loss and substance abuse.    Past Medical History:  Diagnosis Date  . Asthma    as a child, no problems as an adult, no inhaler  . Cataract    NS OU  . Chronic hypertension during pregnancy, antepartum 08/19/2017  . Dehydration 01/28/2018  . Depression during pregnancy, antepartum 07/07/2017   6/20: Short trial of zoloft previously, reports didn't help much but also didn't give it a chance Discussed r/b/a SSRIs in pregnancy, agrees to try Zoloft again, rx sent No SI/HI/red flags  . Diabetes ([place])    TYPE I  . 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  Marland Kitchen HTN (hypertension)   . Hypertensive retinopathy    OU  . Hypokalemia 01/22/2018  . Hypomagnesemia 01/28/2018  . Intractable nausea and vomiting 01/22/2018  . Intrauterine growth restriction (IUGR) affecting care of mother 12/22/2017  . Morbid obesity ([place])   . 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 Severe hyperemesis gravidarum 10/30/2017  . Type I diabetes mellitus ([place]) 07/07/2017   Current Diabetic Medications:  Insulin  [x]  Aspirin 81 mg daily after 12 weeks (? A2/B GDM)  Required Referrals for A1GDM  or A2GDM: [x]  Diabetes Education and Testing Supplies [x]  Nutrition Cousult  For A2/B GDM or higher classes of DM [x]  Diabetes Education and Testing Supplies [x]  Nutrition Counsult [x]  Fetal ECHO after 20 weeks  [x]  Eye exam for  retina evaluation - severe retinopathy 7/19  Base  . Ventricular septal defect (VSD) of fetus in singleton pregnancy, antepartum 09/30/2017   May go to newborn nursery per Dr. Lenard Simmer Echo prior to discharge    Past Surgical History:  Procedure Laterality Date  . [place] VITRECTOMY WITH 20 GAUGE MVR PORT FOR MACULAR HOLE Left 07/20/2018   Procedure: 25 GAUGE PARS PLANA VITRECTOMY LEFT EYE;  Surgeon: Bernarda Caffey, MD;  Location: [facility];  Service: Ophthalmology;  Laterality: Left;  . CESAREAN SECTION N/A 12/26/2017   Procedure: CESAREAN SECTION;  Surgeon: Osborne Oman, MD;  Location: [facility];  Service: Obstetrics;  Laterality: N/A;  . EYE EXAMINATION UNDER ANESTHESIA Right 07/20/2018   Procedure: Eye Exam Under Anesthesia RIGHT EYE;  Surgeon: Bernarda Caffey, MD;  Location: [facility];  Service: Ophthalmology;  Laterality: Right;  . EYE SURGERY Left 07/2018  . GAS INSERTION Left 07/19/2019   Procedure: INSERTION OF GAS;  Surgeon: Bernarda Caffey, MD;  Location: [facility];  Service: Ophthalmology;  Laterality: Left;  SF6  . INJECTION OF SILICONE OIL Left 06/21/4032   Procedure: Injection Of Silicone Oil LEFT EYE;  Surgeon: Bernarda Caffey, MD;  Location: [facility];  Service: Ophthalmology;  Laterality: Left;  . LASER PHOTO ABLATION Right 07/20/2018   Procedure: Laser Photo Ablation RIGHT EYE;  Surgeon: Bernarda Caffey, MD;  Location: [facility];  Service: Ophthalmology;  Laterality: Right;  . MEMBRANE PEEL Left 07/20/2018   Procedure: Membrane Peel LEFT EYE;  Surgeon: Bernarda Caffey, MD;  Location: [facility];  Service: Ophthalmology;  Laterality: Left;  . MEMBRANE PEEL Left 07/19/2019   Procedure: MEMBRANE PEEL;  Surgeon: Bernarda Caffey, MD;  Location: [facility];  Service: Ophthalmology;  Laterality: Left;  . MITOMYCIN C APPLICATION Bilateral 07/21/2593   Procedure: Avastin Application;  Surgeon: Bernarda Caffey, MD;  Location: [facility];  Service: Ophthalmology;  Laterality: Bilateral;  . PHOTOCOAGULATION WITH LASER Left  07/20/2018   Procedure: Photocoagulation With Laser LEFT EYE;  Surgeon: Bernarda Caffey, MD;  Location: [facility];  Service: Ophthalmology;  Laterality: Left;  . PHOTOCOAGULATION WITH LASER Left 07/19/2019   Procedure: PHOTOCOAGULATION WITH LASER;  Surgeon: Bernarda Caffey, MD;  Location: [facility];  Service: Ophthalmology;  Laterality: Left;  . RETINAL DETACHMENT SURGERY Left 07/20/2018   Dr. Bernarda Caffey  . SILICON OIL REMOVAL Left 07/19/2019   Procedure: 25g PARS PLANA VITRECTOMY WITH SILICON OIL REMOVAL;  Surgeon: Bernarda Caffey, MD;  Location: [facility];  Service: Ophthalmology;  Laterality: Left;  . WISDOM TOOTH EXTRACTION       reports that she has never smoked. She has never used smokeless tobacco. She reports previous alcohol use. She reports that she does not use drugs.  No Known Allergies  Family History  Problem Relation Age of Onset  . Diabetes Mother   . Aneurysm Mother 47  . Seizures Mother   . Diabetes Father   . Cataracts Father   . COPD Father   . Heart attack Father   . Heart disease Father   . Healthy Sister   . Healthy Daughter   . Stroke Maternal Grandfather   . Amblyopia Neg Hx   . Blindness Neg Hx   . Glaucoma Neg Hx   . Macular degeneration Neg  Hx   . Retinal detachment Neg Hx   . Strabismus Neg Hx   . Retinitis pigmentosa Neg Hx     Prior to Admission medications   Medication Sig Start Date End Date Taking? Authorizing Provider  Blood Pressure Monitoring (OMRON 3 SERIES BP MONITOR) DEVI 1 each by Does not apply route daily. Take daily blood pressure. Please report back to Dr. Radford Pax in 2 wks. 08/08/18   Sueanne Margarita, MD  carvedilol (COREG) 25 MG tablet Take 1 tablet (25 mg total) by mouth 2 (two) times daily. 03/07/19   Imogene Burn, PA-C  diltiazem (CARDIZEM CD) 240 MG 24 hr capsule Take 1 capsule (240 mg total) by mouth daily. 03/08/19   Imogene Burn, PA-C  furosemide (LASIX) 20 MG tablet Take 2 tablets (40 mg total) by mouth daily as needed for edema. Patient  taking differently: Take 20 mg by mouth daily as needed for edema.  04/19/19   Skeet Latch, MD  hydrALAZINE (APRESOLINE) 50 MG tablet Take 2 tablets (100 mg total) by mouth in the morning AND 1 tablet (50 mg total) daily at 12 noon AND 2 tablets (100 mg total) every evening. Patient taking differently: Take 50 mg by mouth three times daily 06/21/19   Skeet Latch, MD  insulin aspart (NOVOLOG) 100 UNIT/ML injection FOR USE IN PUMP, TOTAL OF 80 UNITS PER DAY. Patient taking differently: Inject 80 Units into the skin daily. FOR USE IN PUMP, TOTAL OF 80 UNITS PER DAY. 08/08/19   Renato Shin, MD  Insulin Pen Needle (PEN NEEDLES) 32G X 5 MM MISC 1 Device by Does not apply route 4 (four) times daily. 03/23/18   Renato Shin, MD  irbesartan (AVAPRO) 300 MG tablet Take 300 mg by mouth daily. 08/16/19   [provider]  metoCLOPramide (REGLAN) 10 MG tablet Take 1 tablet (10 mg total) by mouth every 8 (eight) hours as needed for nausea. 08/21/19   Hosie Poisson, MD  ondansetron (ZOFRAN-ODT) 8 MG disintegrating tablet Take 1 tablet (8 mg total) by mouth every 8 (eight) hours as needed for nausea or vomiting. 08/21/19   Hosie Poisson, MD  [facility] VERIO test strip USE AS DIRECTED 5 TIMES DAILY 03/09/19   Renato Shin, MD  potassium chloride (KLOR-CON) 10 MEQ tablet Take 2 tablets (20 mEq total) by mouth daily for 2 days. 08/21/19 08/23/19  Hosie Poisson, MD  prednisoLONE acetate (PRED FORTE) 1 % ophthalmic suspension Place 1 drop into the left eye 4 (four) times daily. 07/26/19   Bernarda Caffey, MD  promethazine (PHENERGAN) 25 MG tablet Take 1 tablet (25 mg total) by mouth every 6 (six) hours as needed for nausea or vomiting. 08/14/19   Etter Sjogren, PA-C    Physical Exam: Vitals:   09/14/19 0637 09/14/19 0729 09/14/19 0800 09/14/19 0915  BP: (!) 224/119 (!) 231/122 (!) 237/115 (!) 180/131  Pulse:  93 94 (!) 106  Resp:  16 13   Temp:      TempSrc:      SpO2:  98% 99% 100%  Weight:      Height:         Constitutional: NAD, calm, comfortable Vitals:   09/14/19 0637 09/14/19 0729 09/14/19 0800 09/14/19 0915  BP: (!) 224/119 (!) 231/122 (!) 237/115 (!) 180/131  Pulse:  93 94 (!) 106  Resp:  16 13   Temp:      TempSrc:      SpO2:  98% 99% 100%  Weight:  Height:       Eyes: PERRL, lids and conjunctivae normal ENMT: Mucous membranes are dry. Posterior pharynx clear of any exudate or lesions.Normal dentition.  Neck: normal, supple, trachea midline Respiratory: clear to auscultation bilaterally, no wheezing, no crackles. Normal respiratory effort. No accessory muscle use.  Cardiovascular: Regular rate and rhythm, perfused Abdomen: no tenderness, nondistended Musculoskeletal: no clubbing / cyanosis. No joint deformity upper and lower extremities. Good ROM, no contractures. Normal muscle tone.  Skin: no rashes, lesions Neurologic: CN 2-12 grossly intact.  No tremors Psychiatric: Normal judgment and insight. Alert and oriented x 3. Normal mood.    Labs on Admission: I have personally reviewed following labs and imaging studies  CBC: Recent Labs  Lab 09/14/19 0639  WBC 9.0  HGB 13.1  HCT 42.0  MCV 80.9  PLT 419*   Basic Metabolic Panel: Recent Labs  Lab 09/14/19 0639 09/14/19 0921  NA 137  --   K 2.8*  --   CL 98  --   CO2 20*  --   GLUCOSE 322*  --   BUN 20  --   CREATININE 2.44*  --   CALCIUM 8.8*  --   MG  --  1.8   GFR: Estimated Creatinine Clearance: 38 mL/min (A) (by C-G formula based on SCr of 2.44 mg/dL (H)). Liver Function Tests: Recent Labs  Lab 09/14/19 0639  AST 29  ALT 50*  ALKPHOS 82  BILITOT 0.7  PROT 7.2  ALBUMIN 2.3*   Recent Labs  Lab 09/14/19 0639  LIPASE 28   No results for input(s): AMMONIA in the last 168 hours. Coagulation Profile: No results for input(s): INR, PROTIME in the last 168 hours. Cardiac Enzymes: No results for input(s): CKTOTAL, CKMB, CKMBINDEX, TROPONINI in the last 168 hours. BNP (last 3 results) No  results for input(s): PROBNP in the last 8760 hours. HbA1C: No results for input(s): HGBA1C in the last 72 hours. CBG: Recent Labs  Lab 09/14/19 0635 09/14/19 0903 09/14/19 1032  GLUCAP 314* 290* 190*   Lipid Profile: No results for input(s): CHOL, HDL, LDLCALC, TRIG, CHOLHDL, LDLDIRECT in the last 72 hours. Thyroid Function Tests: No results for input(s): TSH, T4TOTAL, FREET4, T3FREE, THYROIDAB in the last 72 hours. Anemia Panel: No results for input(s): VITAMINB12, FOLATE, FERRITIN, TIBC, IRON, RETICCTPCT in the last 72 hours. Urine analysis:    Component Value Date/Time   COLORURINE YELLOW 08/20/2019 1351   APPEARANCEUR CLEAR 08/20/2019 1351   LABSPEC 1.017 08/20/2019 1351   PHURINE 6.0 08/20/2019 1351   GLUCOSEU >=500 (A) 08/20/2019 1351   HGBUR MODERATE (A) 08/20/2019 1351   BILIRUBINUR NEGATIVE 08/20/2019 1351   KETONESUR 20 (A) 08/20/2019 1351   PROTEINUR >=300 (A) 08/20/2019 1351   UROBILINOGEN 0.2 08/14/2019 1051   NITRITE NEGATIVE 08/20/2019 1351   LEUKOCYTESUR NEGATIVE 08/20/2019 1351   Sepsis Labs: !!!!!!!!!!!!!!!!!!!!!!!!!!!!!!!!!!!!!!!!!!!! @LABRCNTIP (procalcitonin:4,lacticidven:4) ) Recent Results (from the past 240 hour(s))  SARS Coronavirus 2 by RT PCR (hospital order, performed in [facility] hospital lab) Nasopharyngeal Nasopharyngeal Swab     Status: Abnormal   Collection Time: 09/14/19  7:24 AM   Specimen: Nasopharyngeal Swab  Result Value Ref Range Status   SARS Coronavirus 2 POSITIVE (A) NEGATIVE Final    Comment: RESULT CALLED TO, READ BACK BY AND VERIFIED WITH: DOWD,P @ [id number] ON [id number] BY POTEAT,S (NOTE) SARS-CoV-2 target nucleic acids are DETECTED  SARS-CoV-2 RNA is generally detectable in upper respiratory specimens  during the acute phase of infection.  Positive results are indicative  of the presence of the identified virus, but do not rule out bacterial infection or co-infection with other pathogens not detected by the test.  Clinical  correlation with patient history and  other diagnostic information is necessary to determine patient infection status.  The expected result is negative.  Fact Sheet for Patients:   StrictlyIdeas.no   Fact Sheet for Healthcare Providers:   BankingDealers.co.za    This test is not yet approved or cleared by the [place] FDA and  has been authorized for detection and/or diagnosis of SARS-CoV-2 by FDA under an Emergency Use Authorization (EUA).  This EUA will remain in effect (meaning this t est can be used) for the duration of  the COVID-19 declaration under Section 564(b)(1) of the Act, 21 U.S.C. section 360-bbb-3(b)(1), unless the authorization is terminated or revoked sooner.  Performed at [facility], [place] [address] [place], [place] [address]      Radiological Exams on Admission: No results found.  EKG: Independently reviewed. 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 DKA 1. Presents with at least 2 days of not having insulin via insulin pump secondary to patient feeling too weak to set up cartridge 2. Presents with random glucose in excess of 322, greater than 500 glucose on UA with positive ketones 3. Insulin drip started in emergency department, will continue 4. Will keep patient n.p.o. except medications 5. Continue aggressive IV fluid hydration 6. Follow serial electrolytes and replace as needed 7. Anticipate transition back to insulin pump once DKA resolves 8. After being on insulin drip and IV fluids, patient thus far states feeling better already. 2. Hypertension 1. Very poorly controlled  likely secondary to patient being in tolerant of her home blood pressure medications secondary to nausea and vomiting 2. At the time patient was seen, patient had already received IV fluids and currently is on insulin drip.  Patient reports feeling better and believes she is able to tolerate oral medications again. 3. We will resume home medications for now 4. We will add as needed hydralazine 3. Intractable nausea vomiting 1. Continue with antiemetics as tolerated 2. Of note, patient was recently discharged on 08/21/2019 for intractable nausea and vomiting suspected from diabetic gastroparesis versus gastroenteritis.  Patient was to follow-up with gastroenterology for gastric emptying study 3. Patient has not followed up with GI yet 4. Type 1 diabetes with diabetic retinopathy 1. Per above, patient is now on insulin drip 2. Anticipate transition back to insulin pump once diabetic ketoacidosis resolves 5. Hypokalemia 1. Replaced in emergency department 2. Follow serial electrolytes, continue to replace as needed 3. Repeat basic metabolic panel in the morning 6. Dehydration 1. Likely secondary to combination of markedly poor p.o. intake in addition to presenting DKA 2. Continue IV fluid hydration as tolerated 7. Covid positive 1. Patient recently received her second dose of Pfizer vaccine proximally 1 week prior to this hospital visit 2. Covid test on 08/20/2019 was negative, repeat Covid test on 09/14/2019 now incidentally positive 3. Chest x-ray ordered by EDP, currently pending 4. We will follow serial inflammatory markers, pending at this time 5. Patient is currently not hypoxemic 8. Stage III CKD 1. Creatinine currently  2.44, seems to be near baseline 2. Follow basic metabolic panel in the morning  DVT prophylaxis: Heparin subq  Code Status: Full Family Communication: Pt in room  Disposition Plan:   Consults called:  Admission status: Inpatient as will likely require greater than 2  midnight stay for IV fluid hydration to treat DKA  Marylu Lund MD [facility] Pager On Amion  If 7PM-7AM, please contact night-coverage  09/14/2019, 10:52 AM

## 2019-09-14 NOTE — Discharge Instructions (Signed)
Whenever you are sick follow sick day guidelines if possible. You have to have your insulin everyday or you will go into DKA. Check your glucose more often and follow the handout with suggestions on oral intake items. Please follow up with Dr. Loanne Drilling and formulate more specific plan related to sick days for the future.

## 2019-09-14 NOTE — ED Provider Notes (Signed)
[facility] DEPT Provider Note   CSN: [id number] Arrival date & time: 09/14/19  4098     History Chief Complaint  Patient presents with  . Emesis    Barbara Donovan is a 28 y.o. female.  HPI   Patient is a 29 year old female with a history of type 1 diabetes on insulin pump, diabetic nephropathy, as well as hypertension who presents due to intractable nausea vomiting.  Patient states about 2 days ago she started experiencing nausea and vomiting that worsens with any p.o. intake.  She needed to replace the cartridge on her insulin pump 2 days ago but due to her weakness felt that she was unable to and has not had any insulin for the past 2 days.  She additionally reports that her blood pressure has been high and she has been unable to tolerate any of her p.o. medications.  She reports some mild left-sided chest pain that worsens with palpation.  It is intermittent and there is none currently.  Patient states she obtained her second Pfizer vaccine last week.  She denies fevers, chills, abdominal pain, diarrhea, urinary changes, syncope.     Past Medical History:  Diagnosis Date  . Asthma    as a child, no problems as an adult, no inhaler  . Cataract    NS OU  . Chronic hypertension during pregnancy, antepartum 08/19/2017  . Dehydration 01/28/2018  . Depression during pregnancy, antepartum 07/07/2017   6/20: Short trial of zoloft previously, reports didn't help much but also didn't give it a chance Discussed r/b/a SSRIs in pregnancy, agrees to try Zoloft again, rx sent No SI/HI/red flags  . Diabetes ([place])    TYPE I  . 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  Marland Kitchen HTN (hypertension)   . Hypertensive retinopathy    OU  . Hypokalemia 01/22/2018  . Hypomagnesemia 01/28/2018  . Intractable nausea and vomiting 01/22/2018  . Intrauterine growth restriction (IUGR) affecting care of  mother 12/22/2017  . Morbid obesity ([place])   . Nephropathy, diabetic ([place]) 12/29/2017  . 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 Severe hyperemesis gravidarum 10/30/2017  . Type I diabetes mellitus ([place]) 07/07/2017   Current Diabetic Medications:  Insulin  [x]  Aspirin 81 mg daily after 12 weeks (? A2/B GDM)  Required Referrals for A1GDM or A2GDM: [x]  Diabetes Education and Testing Supplies [x]  Nutrition Cousult  For A2/B GDM or higher classes of DM [x]  Diabetes Education and Testing Supplies [x]  Nutrition Counsult [x]  Fetal ECHO after 20 weeks  [x]  Eye exam for retina evaluation - severe retinopathy 7/19  Base  . Ventricular septal defect (VSD) of fetus in singleton pregnancy, antepartum 09/30/2017   May go to newborn nursery per Dr. Lenard Simmer Echo prior to discharge    Patient Active Problem List   Diagnosis Date Noted  . SOB (shortness of breath) 03/27/2018  . Hypomagnesemia 01/28/2018  . Dehydration 01/28/2018  . Intractable nausea and vomiting 01/22/2018  . Hypokalemia 01/22/2018  . Nephropathy, diabetic ([place]) 12/29/2017  . 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 Chronic hypertension 08/21/2017  . Type I diabetes mellitus ([place]) 07/07/2017  . Diabetic retinopathy ([place]) 06/09/2017  . Acute depression 11/02/2014  . Asthma 10/30/2013    Past Surgical History:  Procedure Laterality Date  . [place] VITRECTOMY WITH 20 GAUGE MVR PORT FOR MACULAR HOLE  Left 07/20/2018   Procedure: [place] VITRECTOMY LEFT EYE;  Surgeon: Bernarda Caffey, MD;  Location: [facility];  Service: Ophthalmology;  Laterality: Left;  . CESAREAN SECTION N/A 12/26/2017   Procedure: CESAREAN SECTION;  Surgeon: Osborne Oman, MD;  Location: [facility];  Service: Obstetrics;  Laterality: N/A;  . EYE EXAMINATION UNDER ANESTHESIA Right 07/20/2018   Procedure: Eye Exam Under Anesthesia RIGHT EYE;  Surgeon: Bernarda Caffey, MD;  Location:  [facility];  Service: Ophthalmology;  Laterality: Right;  . EYE SURGERY Left 07/2018  . GAS INSERTION Left 07/19/2019   Procedure: INSERTION OF GAS;  Surgeon: Bernarda Caffey, MD;  Location: [facility];  Service: Ophthalmology;  Laterality: Left;  SF6  . INJECTION OF SILICONE OIL Left 0/05/3974   Procedure: Injection Of Silicone Oil LEFT EYE;  Surgeon: Bernarda Caffey, MD;  Location: [facility];  Service: Ophthalmology;  Laterality: Left;  . LASER PHOTO ABLATION Right 07/20/2018   Procedure: Laser Photo Ablation RIGHT EYE;  Surgeon: Bernarda Caffey, MD;  Location: [facility];  Service: Ophthalmology;  Laterality: Right;  . MEMBRANE PEEL Left 07/20/2018   Procedure: Membrane Peel LEFT EYE;  Surgeon: Bernarda Caffey, MD;  Location: [facility];  Service: Ophthalmology;  Laterality: Left;  . MEMBRANE PEEL Left 07/19/2019   Procedure: MEMBRANE PEEL;  Surgeon: Bernarda Caffey, MD;  Location: [facility];  Service: Ophthalmology;  Laterality: Left;  . MITOMYCIN C APPLICATION Bilateral 07/21/4191   Procedure: Avastin Application;  Surgeon: Bernarda Caffey, MD;  Location: [facility];  Service: Ophthalmology;  Laterality: Bilateral;  . PHOTOCOAGULATION WITH LASER Left 07/20/2018   Procedure: Photocoagulation With Laser LEFT EYE;  Surgeon: Bernarda Caffey, MD;  Location: [facility];  Service: Ophthalmology;  Laterality: Left;  . PHOTOCOAGULATION WITH LASER Left 07/19/2019   Procedure: PHOTOCOAGULATION WITH LASER;  Surgeon: Bernarda Caffey, MD;  Location: [facility];  Service: Ophthalmology;  Laterality: Left;  . RETINAL DETACHMENT SURGERY Left 07/20/2018   Dr. Bernarda Caffey  . SILICON OIL REMOVAL Left 07/19/2019   Procedure: 25g PARS PLANA VITRECTOMY WITH SILICON OIL REMOVAL;  Surgeon: Bernarda Caffey, MD;  Location: [facility];  Service: Ophthalmology;  Laterality: Left;  . WISDOM TOOTH EXTRACTION       OB History    Gravida  1   Para  1   Term      Preterm  1   AB      Living  1     SAB      TAB      Ectopic      Multiple  0   Live Births  1            Family History  Problem Relation Age of Onset  . Diabetes Mother   . Aneurysm Mother 33  . Seizures Mother   . Diabetes Father   . Cataracts Father   . COPD Father   . Heart attack Father   . Heart disease Father   . Healthy Sister   . Healthy Daughter   . Stroke Maternal Grandfather   . Amblyopia Neg Hx   . Blindness Neg Hx   . Glaucoma Neg Hx   . Macular degeneration Neg Hx   . Retinal detachment Neg Hx   . Strabismus Neg Hx   . Retinitis pigmentosa Neg Hx     Social History   Tobacco Use  . Smoking status: Never Smoker  . Smokeless tobacco: Never Used  Vaping Use  . Vaping Use: Never used  Substance  Use Topics  . Alcohol use: Not Currently    Comment: SOCIALLY  . Drug use: Never    Home Medications Prior to Admission medications   Medication Sig Start Date End Date Taking? Authorizing Provider  Blood Pressure Monitoring (OMRON 3 SERIES BP MONITOR) DEVI 1 each by Does not apply route daily. Take daily blood pressure. Please report back to Dr. Radford Pax in 2 wks. 08/08/18   Sueanne Margarita, MD  carvedilol (COREG) 25 MG tablet Take 1 tablet (25 mg total) by mouth 2 (two) times daily. 03/07/19   Imogene Burn, PA-C  diltiazem (CARDIZEM CD) 240 MG 24 hr capsule Take 1 capsule (240 mg total) by mouth daily. 03/08/19   Imogene Burn, PA-C  furosemide (LASIX) 20 MG tablet Take 2 tablets (40 mg total) by mouth daily as needed for edema. Patient taking differently: Take 20 mg by mouth daily as needed for edema.  04/19/19   Skeet Latch, MD  hydrALAZINE (APRESOLINE) 50 MG tablet Take 2 tablets (100 mg total) by mouth in the morning AND 1 tablet (50 mg total) daily at 12 noon AND 2 tablets (100 mg total) every evening. Patient taking differently: Take 50 mg by mouth three times daily 06/21/19   Skeet Latch, MD  insulin aspart (NOVOLOG) 100 UNIT/ML injection FOR USE IN PUMP, TOTAL OF 80 UNITS PER DAY. Patient taking differently: Inject 80 Units into the skin daily.  FOR USE IN PUMP, TOTAL OF 80 UNITS PER DAY. 08/08/19   Renato Shin, MD  Insulin Pen Needle (PEN NEEDLES) 32G X 5 MM MISC 1 Device by Does not apply route 4 (four) times daily. 03/23/18   Renato Shin, MD  irbesartan (AVAPRO) 300 MG tablet Take 300 mg by mouth daily. 08/16/19   [provider]  metoCLOPramide (REGLAN) 10 MG tablet Take 1 tablet (10 mg total) by mouth every 8 (eight) hours as needed for nausea. 08/21/19   Hosie Poisson, MD  ondansetron (ZOFRAN-ODT) 8 MG disintegrating tablet Take 1 tablet (8 mg total) by mouth every 8 (eight) hours as needed for nausea or vomiting. 08/21/19   Hosie Poisson, MD  [facility] VERIO test strip USE AS DIRECTED 5 TIMES DAILY 03/09/19   Renato Shin, MD  potassium chloride (KLOR-CON) 10 MEQ tablet Take 2 tablets (20 mEq total) by mouth daily for 2 days. 08/21/19 08/23/19  Hosie Poisson, MD  prednisoLONE acetate (PRED FORTE) 1 % ophthalmic suspension Place 1 drop into the left eye 4 (four) times daily. 07/26/19   Bernarda Caffey, MD  promethazine (PHENERGAN) 25 MG tablet Take 1 tablet (25 mg total) by mouth every 6 (six) hours as needed for nausea or vomiting. 08/14/19   Kendrick, Caitlyn S, PA-C    Allergies    Patient has no known allergies.  Review of Systems   Review of Systems  All other systems reviewed and are negative. Ten systems reviewed and are negative for acute change, except as noted in the HPI.   Physical Exam Updated Vital Signs BP (!) 224/119 (BP Location: Right Arm)   Pulse (!) 120   Temp 98.9 F (37.2 C) (Oral)   Resp 20   Ht 5\' 6"  (1.676 m)   Wt 86.2 kg   SpO2 100%   BMI 30.67 kg/m   Physical Exam Vitals and nursing note reviewed.  Constitutional:      General: She is in acute distress.     Appearance: Normal appearance. She is normal weight. She is ill-appearing. She is not  toxic-appearing or diaphoretic.     Comments: Fatigued appearing female lying in the semi-Fowlers position.  She speaks in a soft tone but speaks clearly  and coherently.  HENT:     Head: Normocephalic and atraumatic.     Right Ear: External ear normal.     Left Ear: External ear normal.     Nose: Nose normal.     Mouth/Throat:     Mouth: Mucous membranes are dry.     Pharynx: Oropharynx is clear. No oropharyngeal exudate or posterior oropharyngeal erythema.  Eyes:     General: No scleral icterus.       Right eye: No discharge.        Left eye: No discharge.     Extraocular Movements: Extraocular movements intact.     Conjunctiva/sclera: Conjunctivae normal.     Pupils: Pupils are equal, round, and reactive to light.  Cardiovascular:     Rate and Rhythm: Regular rhythm. Tachycardia present.     Pulses: Normal pulses.     Heart sounds: Normal heart sounds. No murmur heard.  No friction rub. No gallop.      Comments: Mild TTP noted along the left anterior chest wall.  No crepitus or overlying trauma. Pulmonary:     Effort: Pulmonary effort is normal. No respiratory distress.     Breath sounds: Normal breath sounds. No stridor. No wheezing, rhonchi or rales.  Abdominal:     General: Abdomen is flat.     Palpations: Abdomen is soft.     Tenderness: There is no abdominal tenderness.     Comments: Abdomen is soft and nontender in all 4 quadrants.  Musculoskeletal:        General: Normal range of motion.     Cervical back: Normal range of motion and neck supple. No tenderness.     Right lower leg: No edema.     Left lower leg: No edema.  Skin:    General: Skin is warm and dry.  Neurological:     General: No focal deficit present.     Mental Status: She is alert and oriented to person, place, and time.  Psychiatric:        Mood and Affect: Mood normal.        Behavior: Behavior normal.    ED Results / Procedures / Treatments   Labs (all labs ordered are listed, but only abnormal results are displayed) Labs Reviewed  SARS CORONAVIRUS 2 BY RT PCR (HOSPITAL ORDER, [facility]) - Abnormal; Notable for the  following components:      Result Value   SARS Coronavirus 2 POSITIVE (*)    All other components within normal limits  COMPREHENSIVE METABOLIC PANEL - Abnormal; Notable for the following components:   Potassium 2.8 (*)    CO2 20 (*)    Glucose, Bld 322 (*)    Creatinine, Ser 2.44 (*)    Calcium 8.8 (*)    Albumin 2.3 (*)    ALT 50 (*)    GFR calc non Af Amer 26 (*)    GFR calc Af Amer 30 (*)    Anion gap 19 (*)    All other components within normal limits  CBC - Abnormal; Notable for the following components:   RBC 5.19 (*)    MCH 25.2 (*)    Platelets 643 (*)    All other components within normal limits  BETA-HYDROXYBUTYRIC ACID - Abnormal; Notable for the following components:   Beta-Hydroxybutyric  Acid 2.17 (*)    All other components within normal limits  BLOOD GAS, VENOUS - Abnormal; Notable for the following components:   pCO2, Ven 38.5 (*)    All other components within normal limits  CBG MONITORING, ED - Abnormal; Notable for the following components:   Glucose-Capillary 314 (*)    All other components within normal limits  CBG MONITORING, ED - Abnormal; Notable for the following components:   Glucose-Capillary 290 (*)    All other components within normal limits  LIPASE, BLOOD  MAGNESIUM  URINALYSIS, ROUTINE W REFLEX MICROSCOPIC  BETA-HYDROXYBUTYRIC ACID  I-STAT BETA HCG BLOOD, ED ([facility], [facility], [facility] ONLY)   EKG EKG Interpretation  Date/Time:  Friday September 14 2019 06:34:35 EDT Ventricular Rate:  108 PR Interval:    QRS Duration: 92 QT Interval:  350 QTC Calculation: 470 R Axis:   79 Text Interpretation: Sinus tachycardia Biatrial enlargement No significant change since last tracing Confirmed by Ripley Fraise [phone]) on 09/14/2019 6:48:59 AM  Radiology No results found.  Procedures Procedures   Medications Ordered in ED Medications  insulin regular, human (MYXREDLIN) 100 units/ 100 mL infusion (7 Units/hr Intravenous New Bag/Given 09/14/19 0905)    lactated ringers infusion ( Intravenous New Bag/Given 09/14/19 0825)  dextrose 5 % in lactated ringers infusion (0 mLs Intravenous Hold 09/14/19 0910)  dextrose 50 % solution 0-50 mL (has no administration in time range)  potassium chloride 10 mEq in 100 mL IVPB (10 mEq Intravenous New Bag/Given 09/14/19 0830)  sodium chloride 0.9 % bolus 1,000 mL (0 mLs Intravenous Stopped 09/14/19 0825)  ondansetron (ZOFRAN) injection 4 mg (4 mg Intravenous Given 09/14/19 0725)  labetalol (NORMODYNE) injection 10 mg (10 mg Intravenous Given 09/14/19 0826)  lactated ringers bolus 1,724 mL (1,724 mLs Intravenous New Bag/Given 09/14/19 0825)   ED Course  I have reviewed the triage vital signs and the nursing notes.  Pertinent labs & imaging results that were available during my care of the patient were reviewed by me and considered in my medical decision making (see chart for details).  Clinical Course as of Sep 13 1017  Fri Sep 14, 2019  0701 Glucose-Capillary(!): 314 [LJ]  0803 Anion gap(!): 19 [LJ]  0803 Similar to prior values  Creatinine(!): 2.44 [LJ]  0803 Will replete. Will also check magnesium.  Potassium(!): 2.8 [LJ]  1010 Beta-Hydroxybutyric Acid(!): 2.17 [LJ]  1010 SARS Coronavirus 2(!): POSITIVE [LJ]    Clinical Course User Index [LJ] Rayna Sexton, PA-C   MDM Rules/Calculators/A&P                          Pt is a 29 y.o. female that presents with a history, physical exam, and ED Clinical Course as noted above.   Patient presents today with 2 days of intractable nausea and vomiting.  Patient has not had any insulin for the past 2 days and is a type I diabetic.  Additionally has not taken any of her regular medications for hypertension.  Patient was given 10 mg of IV labetalol.  Patient was found today to be hyperglycemic with a anion gap of 19 and a beta hydroxybutyric acid of 2.17.  She was started on insulin, LR, as well as IV potassium for her hypokalemia of 2.8.  Creatinine elevated to  2.44 which appears to be similar to her baseline value.  Glucose was 314 upon arrival.  Patient states that she completed her second round of the [place] vaccine last week.  Patient was found to be positive for COVID-19 today.  We'll discuss with the hospitalist team for admission due to DKA, hypokalemia, COVID-19.  Hospitalist team recommended I order inflammatory markers as well as chest x-ray.  I will order those now.  Note: Portions of this report may have been transcribed using voice recognition software. Every effort was made to ensure accuracy; however, inadvertent computerized transcription errors may be present.   Final Clinical Impression(s) / ED Diagnoses Final diagnoses:  Diabetic ketoacidosis without coma associated with type 1 diabetes mellitus ([place])  COVID-19  Hypokalemia   Rx / DC Orders ED Discharge Orders    None       Rayna Sexton, PA-C 09/14/19 Buhl, Gwenyth Allegra, MD 09/14/19 1217

## 2019-09-14 NOTE — ED Notes (Signed)
This RN saw hospitalist, Dr. Wyline Copas, in the hallway and asked about whether or not we could take the patient off of the insulin drip. MD stated that he would review her labs and determine if she qualifies to be taking off the insulin drip.

## 2019-09-15 LAB — BASIC METABOLIC PANEL
Anion gap: 9 (ref 5–15)
Anion gap: 11 (ref 5–15)
BUN: 18 mg/dL (ref 6–20)
BUN: 18 mg/dL (ref 6–20)
CO2: 22 mmol/L (ref 22–32)
CO2: 23 mmol/L (ref 22–32)
Calcium: 7.9 mg/dL — ABNORMAL LOW (ref 8.9–10.3)
Calcium: 8.2 mg/dL — ABNORMAL LOW (ref 8.9–10.3)
Chloride: 102 mmol/L (ref 98–111)
Chloride: 100 mmol/L (ref 98–111)
Creatinine, Ser: 2.4 mg/dL — ABNORMAL HIGH (ref 0.44–1.00)
Creatinine, Ser: 2.48 mg/dL — ABNORMAL HIGH (ref 0.44–1.00)
GFR calc Af Amer: 31 mL/min — ABNORMAL LOW (ref >60)
GFR calc Af Amer: 30 mL/min — ABNORMAL LOW (ref >60)
GFR calc non Af Amer: 27 mL/min — ABNORMAL LOW (ref >60)
GFR calc non Af Amer: 26 mL/min — ABNORMAL LOW (ref >60)
Glucose, Bld: 176 mg/dL — ABNORMAL HIGH (ref 70–99)
Glucose, Bld: 166 mg/dL — ABNORMAL HIGH (ref 70–99)
Potassium: 2.9 mmol/L — ABNORMAL LOW (ref 3.5–5.1)
Potassium: 2.8 mmol/L — ABNORMAL LOW (ref 3.5–5.1)
Sodium: 133 mmol/L — ABNORMAL LOW (ref 135–145)
Sodium: 134 mmol/L — ABNORMAL LOW (ref 135–145)

## 2019-09-15 LAB — CBG MONITORING, ED
Glucose-Capillary: 171 mg/dL — ABNORMAL HIGH (ref 70–99)
Glucose-Capillary: 189 mg/dL — ABNORMAL HIGH (ref 70–99)

## 2019-09-15 LAB — C-REACTIVE PROTEIN: CRP: 0.6 mg/dL (ref <1.0)

## 2019-09-15 LAB — BETA-HYDROXYBUTYRIC ACID: Beta-Hydroxybutyric Acid: 0.36 mmol/L — ABNORMAL HIGH (ref 0.05–0.27)

## 2019-09-15 LAB — FERRITIN: Ferritin: 24 ng/mL (ref 11–307)

## 2019-09-15 LAB — D-DIMER, QUANTITATIVE: D-Dimer, Quant: 0.93 ug/mL-FEU — ABNORMAL HIGH (ref 0.00–0.50)

## 2019-09-15 MED ORDER — CARVEDILOL 12.5 MG PO TABS
25.0000 mg | ORAL_TABLET | Freq: Two times a day (BID) | ORAL | Status: DC
  Administered 2019-09-15: 25 mg via ORAL
  Filled 2019-09-15: qty 2

## 2019-09-15 MED ORDER — POTASSIUM CHLORIDE 10 MEQ/100ML IV SOLN
10.0000 meq | INTRAVENOUS | Status: AC
  Administered 2019-09-15 (×4): 10 meq via INTRAVENOUS
  Filled 2019-09-15 (×4): qty 100

## 2019-09-15 MED ORDER — DILTIAZEM HCL ER COATED BEADS 120 MG PO CP24
240.0000 mg | ORAL_CAPSULE | Freq: Every day | ORAL | Status: DC
  Administered 2019-09-15: 240 mg via ORAL
  Filled 2019-09-15: qty 2

## 2019-09-15 MED ORDER — POTASSIUM CHLORIDE ER 10 MEQ PO TBCR: 20.0000 meq | EXTENDED_RELEASE_TABLET | Freq: Every day | ORAL | 0 refills | Status: DC

## 2019-09-15 MED ORDER — POTASSIUM CHLORIDE CRYS ER 20 MEQ PO TBCR
40.0000 meq | EXTENDED_RELEASE_TABLET | Freq: Once | ORAL | Status: AC
  Administered 2019-09-15: 40 meq via ORAL
  Filled 2019-09-15: qty 2

## 2019-09-15 MED ORDER — METOCLOPRAMIDE HCL 10 MG PO TABS: 10.0000 mg | ORAL_TABLET | Freq: Three times a day (TID) | ORAL | 0 refills | Status: DC | PRN

## 2019-09-15 NOTE — ED Notes (Signed)
Save lav, gold, dark green all save in main lab

## 2019-09-15 NOTE — Discharge Summary (Signed)
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 female with PMH of type 1 diabetes mellitus on insulin pump, resulting neuropathy, nephropathy, retinopathy, gastroparesis; HTN, CKD stage III.   Patient presented to the ED on 09/14/2019 with complaint of progressively worsening intractable nausea, vomiting and inability to take oral intake, not improving with oral antiemetics.  She had presented in the ED in the past with similar symptoms mostly secondary to gastroparesis. Patient was recently hospitalized (8/2-8/3) for similar symptoms was resolved with Reglan and Phenergan. Patient states that she was unable to reassemble new cartridge on her insulin pump and hence essentially without insulin for the past 2 to 3 days.  Symptoms continue to worsen and hence he presented to the ED. She completed both doses of Pfizer Covid vaccine a week prior to this hospitalization, lives with unvaccinated boyfriend and a 71-month-old baby.  In the ED, patient was afebrile, heart rate 120, blood pressure mostly elevated over 200 to a maximum of 237/115.  Initial blood alcohol level 322 with a potassium of 2.8, anion gap of 19. Venous blood gas with pH of 7.41, PCO2 38, bicarbonate 24. UA was notable for greater than 500 glucose with positive ketones.  She was also noted to  be Covid PCR positive, however with no respiratory symptoms, normal chest x-ray, normal CRP, normal ferritin.  Patient was started on insulin drip, IV hydration and admitted to hospitalist service for further evaluation and management.  Subjective:  Seen and examined this morning. Lying on bed.  Not in distress.  Feels better. She was given regular consistency diet.  By the afternoon, she was able to tolerate regular diet. She feels better and wants to go home.  Hospital Course:  Acute flareup of diabetic gastroparesis -Presented with intractable nausea and vomiting. -Improved with IV fluids, IV antiemetics.. -Tolerated regular consistency diet. -Patient is due for a follow-up with GI as an outpatient for further studies  Acute hyperglycemia with ketosis -Reports not receiving insulin for last 2 to 3 days because of mechanical issues with insulin pump. -Blood glucose on presentation more than 300, anion gap elevated, urinalysis with ketones.  However blood gas shows normal pH, normal serum bicarbonate level. -Technically not in DKA at presentation.   -She was started on IVF fluid, IV insulin drip. -She was subsequently switched to subcu insulin.  After discharge, patient will resume insulin through pump.  Type 1 diabetes mellitus with complications: retinopathy, nephropathy, neuropathy, gastroparesis -On insulin pump at home.  Resume the same post discharge.  Hypertensive urgency -Blood pressure elevated to more than 200 for several hours on admission -Home meds include Coreg 25 mg twice daily, Cardizem 240 mg daily, hydralazine 50 mg 3 times daily, irbesartan 300 mg daily, Lasix 40 mg daily as needed. -All meds resumed.  Blood pressure improved.  Hypokalemia -Potassium low at 2.9 on admission secondary to  intractable vomiting.   -Replaced. Repeat potassium this morning was still low at 2.8. Further replacement given.  Continue further replacement at home.  Covid 19  infection -Patient recently received her second dose of Pfizer vaccine proximally 1 week prior to this hospital visit -Covid test on 08/20/2019 was negative, repeat Covid test on 09/14/2019 now incidentally positive -Patient does not have any respiratory symptoms, chest x-ray is normal and inflammatory markers are normal as well. -She does not need treatment for Covid infection at this time.  CKD stage IIIb -Creatinine remains at baseline  Okay to discharge to home today.  Wound care: Wound / Incision (Open or Dehisced) 12/22/17 Leg Left;Right;Anterior;Mid;Bilateral several sores on legs, one was weeping clear liquid (Active)  Date First Assessed/Time First Assessed: 12/22/17 1835   Location: Leg  Location Orientation: Left;Right;Anterior;Mid;Bilateral  Wound Description (Comments): several sores on legs, one was weeping clear liquid  Present on Admission: Yes    Assessments 12/23/2017  9:15 AM 12/27/2017  8:05 AM  Dressing Type Gauze (Comment);Other (Comment) --  Dressing Changed New --  Dressing Status Clean;Dry;Intact Clean;Dry;Intact  Dressing Change Frequency PRN --  Site / Wound Assessment Pink;Yellow --  Peri-wound Assessment Intact --  Wagner Scale 1 --  Wound Length (cm) 0.4 cm --  Wound Width (cm) 0.4 cm --  Wound Surface Area (cm^2) 0.16 cm^2 --  Drainage Amount Scant None  Drainage Description Serous --     No Linked orders to display     Incision (Closed) 12/26/17 Abdomen Other (Comment) (Active)  Date First Assessed/Time First Assessed: 12/26/17 1655   Location: Abdomen  Location Orientation: Other (Comment)    Assessments 12/26/2017  5:45 PM 12/28/2017  7:48 PM  Dressing Type Honeycomb;Pressure dressing;Adhesive strips;Benzoine Negative pressure wound therapy  Dressing Clean;Dry;Intact Dry;Intact  Closure Adhesive strips;Sutures --  Drainage Amount None --     No Linked orders to display     Incision (Closed) 12/26/17 Vagina Other (Comment) (Active)  Date First  Assessed/Time First Assessed: 12/26/17 1655   Location: Vagina  Location Orientation: Other (Comment)    Assessments 12/26/2017  5:45 PM 12/28/2017 11:58 AM  Dressing Type Peripads Peripads  Dressing New drainage;Reinforced --  Drainage Amount Scant --     No Linked orders to display     Incision (Closed) 07/20/18 Eye Left (Active)  Date First Assessed/Time First Assessed: 07/20/18 1515   Location: Eye  Location Orientation: Left    Assessments 07/20/2018  4:05 PM 07/20/2018  5:22 PM  Dressing Type Gauze (Comment);Tape dressing Gauze (Comment);Tape dressing  Dressing Clean;Dry;Intact Clean;Dry;Intact  Site / Wound Assessment Dressing in place / Unable to assess Dressing in place / Unable to assess  Drainage Amount None None     No Linked orders to display     Incision (Closed) 07/19/19 Eye Left (Active)  Date First Assessed/Time First Assessed: 07/19/19 1224   Location: Eye  Location Orientation: Left    Assessments 07/19/2019  2:45 PM 07/19/2019  3:15 PM  Dressing Type Eye shield --  Drainage Amount None None     No Linked orders to display    Discharge Exam:   Vitals:   09/15/19 1100 09/15/19 1130 09/15/19 1330 09/15/19 1558  BP: (!) 158/96 134/79 (!) 145/88 (!) 145/96  Pulse:  87 93 92  Resp:  18  18  Temp:      TempSrc:      SpO2:  97% 99% 100%  Weight:      Height:  Body mass index is 30.67 kg/m.  General exam: Appears calm and comfortable.  Skin: No rashes, lesions or ulcers. HEENT: Atraumatic, normocephalic, supple neck, no obvious bleeding Lungs: Clear to auscultation bilaterally CVS: Regular rate and rhythm, no murmur GI/Abd soft, nontender, nondistended, bowel sound present CNS: Alert, awake, oriented x3 Psychiatry: Mood appropriate Extremities: No edema, no calf tenderness  Follow ups:   Discharge Instructions    Diet Carb Modified   Complete by: As directed    Increase activity slowly   Complete by: As directed       Follow-up Information     Fanny Bien, MD Follow up.   Specialty: Family Medicine Contact information: [address] [place] [address] [phone]        Sueanne Margarita, MD .   Specialty: Cardiology Contact information: [address] [place]  [address] [phone]               Recommendations for Outpatient Follow-Up:   1. Follow-up with PCP as an outpatient  Discharge Instructions:  Follow with Primary MD Fanny Bien, MD in 7 days   Get CBC/BMP checked in next visit within 1 week by PCP or SNF MD ( we routinely change or add medications that can affect your baseline labs and fluid status, therefore we recommend that you get the mentioned basic workup next visit with your PCP, your PCP may decide not to get them or add new tests based on their clinical decision)  On your next visit with your PCP, please Get Medicines reviewed and adjusted.  Please request your PCP  to go over all Hospital Tests and Procedure/Radiological results at the follow up, please get all Hospital records sent to your Prim MD by signing hospital release before you go home.  Activity: As tolerated with Full fall precautions use walker/cane & assistance as needed  For Heart failure patients - Check your Weight same time everyday, if you gain over 2 pounds, or you develop in leg swelling, experience more shortness of breath or chest pain, call your Primary MD immediately. Follow Cardiac Low Salt Diet and 1.5 lit/day fluid restriction.  If you have smoked or chewed Tobacco in the last 2 yrs please stop smoking, stop any regular Alcohol  and or any Recreational drug use.  If you experience worsening of your admission symptoms, develop shortness of breath, life threatening emergency, suicidal or homicidal thoughts you must seek medical attention immediately by calling 911 or calling your MD immediately  if symptoms less severe.  You Must read complete instructions/literature along with  all the possible adverse reactions/side effects for all the Medicines you take and that have been prescribed to you. Take any new Medicines after you have completely understood and accpet all the possible adverse reactions/side effects.   Do not drive, operate heavy machinery, perform activities at heights, swimming or participation in water activities or provide baby sitting services if your were admitted for syncope or siezures until you have seen by Primary MD or a Neurologist and advised to do so again.  Do not drive when taking Pain medications.  Do not take more than prescribed Pain, Sleep and Anxiety Medications  Wear Seat belts while driving.   Please note You were cared for by a hospitalist during your hospital stay. If you have any questions about your discharge medications or the care you received while you were in the hospital after you are discharged, you can call the  unit and asked to speak with the hospitalist on call if the hospitalist that took care of you is not available. Once you are discharged, your primary care physician will handle any further medical issues. Please note that NO REFILLS for any discharge medications will be authorized once you are discharged, as it is imperative that you return to your primary care physician (or establish a relationship with a primary care physician if you do not have one) for your aftercare needs so that they can reassess your need for medications and monitor your lab values.    Allergies as of 09/15/2019   No Known Allergies     Medication List    STOP taking these medications   prednisoLONE acetate 1 % ophthalmic suspension Commonly known as: PRED FORTE     TAKE these medications   carvedilol 25 MG tablet Commonly known as: COREG Take 1 tablet (25 mg total) by mouth 2 (two) times daily.   diltiazem 240 MG 24 hr capsule Commonly known as: CARDIZEM CD Take 1 capsule (240 mg total) by mouth daily.   furosemide 20 MG  tablet Commonly known as: LASIX Take 2 tablets (40 mg total) by mouth daily as needed for edema. What changed: how much to take   hydrALAZINE 50 MG tablet Commonly known as: APRESOLINE Take 2 tablets (100 mg total) by mouth in the morning AND 1 tablet (50 mg total) daily at 12 noon AND 2 tablets (100 mg total) every evening. What changed: See the new instructions.   insulin aspart 100 UNIT/ML injection Commonly known as: NovoLOG FOR USE IN PUMP, TOTAL OF 80 UNITS PER DAY. What changed:   how much to take  how to take this  when to take this   irbesartan 300 MG tablet Commonly known as: AVAPRO Take 300 mg by mouth daily.   metoCLOPramide 10 MG tablet Commonly known as: REGLAN Take 1 tablet (10 mg total) by mouth every 8 (eight) hours as needed for up to 3 days for nausea.   Omron 3 Series BP Monitor Devi 1 each by Does not apply route daily. Take daily blood pressure. Please report back to Dr. Radford Pax in 2 wks.   ondansetron 8 MG disintegrating tablet Commonly known as: ZOFRAN-ODT Take 1 tablet (8 mg total) by mouth every 8 (eight) hours as needed for nausea or vomiting.   OneTouch Verio test strip Generic drug: glucose blood USE AS DIRECTED 5 TIMES DAILY   Pen Needles 32G X 5 MM Misc 1 Device by Does not apply route 4 (four) times daily.   potassium chloride 10 MEQ tablet Commonly known as: KLOR-CON Take 2 tablets (20 mEq total) by mouth daily for 2 days.   promethazine 25 MG tablet Commonly known as: PHENERGAN Take 1 tablet (25 mg total) by mouth every 6 (six) hours as needed for nausea or vomiting.       Time coordinating discharge: 35 minutes  The results of significant diagnostics from this hospitalization (including imaging, microbiology, ancillary and laboratory) are listed below for reference.    Procedures and Diagnostic Studies:   DG Chest Portable 1 View  Result Date: 09/14/2019 CLINICAL DATA:  Nausea and vomiting with hypertension. COVID-19  positive EXAM: PORTABLE CHEST 1 VIEW COMPARISON:  January 22, 2018. FINDINGS: Lungs are clear. Heart size and pulmonary vascularity are normal. No adenopathy. No bone lesions. IMPRESSION: Lungs clear.  Cardiac silhouette normal. Electronically Signed   By: Lowella Grip III M.D.   On: 09/14/2019 12:17  Labs:   Basic Metabolic Panel: Recent Labs  Lab 09/14/19 0639 09/14/19 0921 09/14/19 1139 09/14/19 1139 09/14/19 1157 09/14/19 1157 09/14/19 1557 09/14/19 1557 09/14/19 1957 09/15/19 0358  NA   < >  --  136  --  133*  --  133*  --  135 134*  K   < >  --  2.8*   < > 2.9*   < > 3.3*   < > 2.9* 2.8*  CL   < >  --  102  --  102  --  103  --  103 100  CO2   < >  --  22  --  22  --  19*  --  21* 23  GLUCOSE   < >  --  200*  --  176*  --  206*  --  217* 166*  BUN   < >  --  20  --  18  --  19  --  20 18  CREATININE   < >  --  2.39*  --  2.40*  --  2.18*  --  2.39* 2.48*  CALCIUM   < >  --  8.1*  --  7.9*  --  7.8*  --  8.0* 8.2*  MG  --  1.8  --   --   --   --   --   --   --   --    < > = values in this interval not displayed.   GFR Estimated Creatinine Clearance: 37.4 mL/min (A) (by C-G formula based on SCr of 2.48 mg/dL (H)). Liver Function Tests: Recent Labs  Lab 09/14/19 0639  AST 29  ALT 50*  ALKPHOS 82  BILITOT 0.7  PROT 7.2  ALBUMIN 2.3*   Recent Labs  Lab 09/14/19 0639  LIPASE 28   No results for input(s): AMMONIA in the last 168 hours. Coagulation profile No results for input(s): INR, PROTIME in the last 168 hours.  CBC: Recent Labs  Lab 09/14/19 0639  WBC 9.0  HGB 13.1  HCT 42.0  MCV 80.9  PLT 643*   Cardiac Enzymes: No results for input(s): CKTOTAL, CKMB, CKMBINDEX, TROPONINI in the last 168 hours. BNP: Invalid input(s): POCBNP CBG: Recent Labs  Lab 09/14/19 1841 09/14/19 1951 09/14/19 2158 09/15/19 0758 09/15/19 1150  GLUCAP 232* 172* 189* 171* 189*   D-Dimer Recent Labs    09/14/19 1047 09/15/19 0358  DDIMER 1.27* 0.93*    Hgb A1c No results for input(s): HGBA1C in the last 72 hours. Lipid Profile Recent Labs    09/14/19 1047  TRIG 128   Thyroid function studies No results for input(s): TSH, T4TOTAL, T3FREE, THYROIDAB in the last 72 hours.  Invalid input(s): FREET3 Anemia work up Recent Labs    09/14/19 1047 09/15/19 0358  FERRITIN 24 24   Microbiology Recent Results (from the past 240 hour(s))  SARS Coronavirus 2 by RT PCR (hospital order, performed in [facility] hospital lab) Nasopharyngeal Nasopharyngeal Swab     Status: Abnormal   Collection Time: 09/14/19  7:24 AM   Specimen: Nasopharyngeal Swab  Result Value Ref Range Status   SARS Coronavirus 2 POSITIVE (A) NEGATIVE Final    Comment: RESULT CALLED TO, READ BACK BY AND VERIFIED WITH: DOWD,P @ 0945 ON [id number] BY POTEAT,S (NOTE) SARS-CoV-2 target nucleic acids are DETECTED  SARS-CoV-2 RNA is generally detectable in upper respiratory specimens  during the acute phase of infection.  Positive results are indicative  of  the presence of the identified virus, but do not rule out bacterial infection or co-infection with other pathogens not detected by the test.  Clinical correlation with patient history and  other diagnostic information is necessary to determine patient infection status.  The expected result is negative.  Fact Sheet for Patients:   StrictlyIdeas.no   Fact Sheet for Healthcare Providers:   BankingDealers.co.za    This test is not yet approved or cleared by the [place] FDA and  has been authorized for detection and/or diagnosis of SARS-CoV-2 by FDA under an Emergency Use Authorization (EUA).  This EUA will remain in effect (meaning this t est can be used) for the duration of  the COVID-19 declaration under Section 564(b)(1) of the Act, 21 U.S.C. section 360-bbb-3(b)(1), unless the authorization is terminated or revoked sooner.  Performed at [facility], [place] [address], [place], [place] [address]      Signed: Marlowe Aschoff Asyia Hornung  [facility] 09/15/2019, 4:10 PM

## 2019-09-25 ENCOUNTER — Other Ambulatory Visit: Payer: Self-pay | Admitting: Cardiovascular Disease

## 2019-10-12 ENCOUNTER — Ambulatory Visit (INDEPENDENT_AMBULATORY_CARE_PROVIDER_SITE_OTHER): Payer: BC Managed Care – PPO | Admitting: Ophthalmology

## 2019-10-12 ENCOUNTER — Other Ambulatory Visit: Payer: Self-pay

## 2019-10-12 DIAGNOSIS — H4313 Vitreous hemorrhage, bilateral: Secondary | ICD-10-CM

## 2019-10-12 DIAGNOSIS — H35372 Puckering of macula, left eye: Secondary | ICD-10-CM

## 2019-10-12 DIAGNOSIS — H3581 Retinal edema: Secondary | ICD-10-CM

## 2019-10-12 DIAGNOSIS — E103513 Type 1 diabetes mellitus with proliferative diabetic retinopathy with macular edema, bilateral: Secondary | ICD-10-CM

## 2019-10-12 DIAGNOSIS — H35033 Hypertensive retinopathy, bilateral: Secondary | ICD-10-CM

## 2019-10-12 DIAGNOSIS — I10 Essential (primary) hypertension: Secondary | ICD-10-CM

## 2019-10-12 DIAGNOSIS — H3342 Traction detachment of retina, left eye: Secondary | ICD-10-CM

## 2019-10-12 DIAGNOSIS — H25811 Combined forms of age-related cataract, right eye: Secondary | ICD-10-CM

## 2019-10-12 DIAGNOSIS — Z961 Presence of intraocular lens: Secondary | ICD-10-CM

## 2019-10-12 NOTE — Progress Notes (Addendum)
[facility] Note  10/12/2019     CHIEF COMPLAINT Patient presents for Retina Follow Up   HISTORY OF PRESENT ILLNESS: Barbara Donovan is a 29 y.o. female who presents to the clinic today for:   HPI    Retina Follow Up    Patient presents with  Diabetic Retinopathy.  In both eyes.  This started months ago.  Severity is moderate.  Duration of months.  Since onset it is rapidly worsening.  I, the attending physician,  performed the HPI with the patient and updated documentation appropriately.          Comments    BS: 161 @ 11AM Last HgA1c: 8.0 Pt states she noticed that she was unable to see out of her right eye yesterday and states VA OS is not getting better.  Pt states she has had loss of insurance coverage and was lost to follow up.        Last edited by Bernarda Caffey, MD on 10/13/2019 10:35 PM. (History)    pt is delayed to follow up from 1 week to 2 months due to losing job/insurance, and being in and out of hospital for gastroparesis. pt states she was driving yesterday and started to see a bunch of new floaters in her right eye vision and it has gotten progressively worse since then, she states left eye vision is essentially the same as last time, she states she has been off all drops since the end of August, she has been to the ED multiple times for gastroparesis, she denies eye pain   Referring physician: Shawnie Dapper, DO [address] [place],  [place] [address]  HISTORICAL INFORMATION:   Selected notes from the MEDICAL RECORD NUMBER Referred by Dr. [name] for concern of bilateral CRVO    CURRENT MEDICATIONS: No current outpatient medications on file. (Ophthalmic Drugs)   No current facility-administered medications for this visit. (Ophthalmic Drugs)   Current Outpatient Medications (Other)  Medication Sig  . Blood Pressure Monitoring (OMRON 3 SERIES BP MONITOR) DEVI 1 each by Does not apply route daily. Take daily  blood pressure. Please report back to Dr. Radford Pax in 2 wks.  . carvedilol (COREG) 25 MG tablet Take 1 tablet (25 mg total) by mouth 2 (two) times daily.  Marland Kitchen diltiazem (CARDIZEM CD) 240 MG 24 hr capsule Take 1 capsule (240 mg total) by mouth daily.  . furosemide (LASIX) 20 MG tablet Take 2 tablets (40 mg total) by mouth daily as needed for edema. (Patient taking differently: Take 20 mg by mouth daily as needed for edema. )  . hydrALAZINE (APRESOLINE) 50 MG tablet Take 50 mg by mouth three times daily  . insulin aspart (NOVOLOG) 100 UNIT/ML injection FOR USE IN PUMP, TOTAL OF 80 UNITS PER DAY. (Patient taking differently: Inject 80 Units into the skin daily. FOR USE IN PUMP, TOTAL OF 80 UNITS PER DAY.)  . Insulin Pen Needle (PEN NEEDLES) 32G X 5 MM MISC 1 Device by Does not apply route 4 (four) times daily. (Patient not taking: Reported on 09/14/2019)  . irbesartan (AVAPRO) 300 MG tablet Take 300 mg by mouth daily.  . metoCLOPramide (REGLAN) 10 MG tablet Take 1 tablet (10 mg total) by mouth every 8 (eight) hours as needed for up to 3 days for nausea.  . ondansetron (ZOFRAN-ODT) 8 MG disintegrating tablet Take 1 tablet (8 mg total) by mouth every 8 (eight) hours as needed for nausea  or vomiting.  [name] VERIO test strip USE AS DIRECTED 5 TIMES DAILY  . potassium chloride (KLOR-CON) 10 MEQ tablet Take 2 tablets (20 mEq total) by mouth daily for 2 days.  . promethazine (PHENERGAN) 25 MG tablet Take 1 tablet (25 mg total) by mouth every 6 (six) hours as needed for nausea or vomiting.   No current facility-administered medications for this visit. (Other)      REVIEW OF SYSTEMS: ROS    Positive for: Endocrine, Eyes   Negative for: Constitutional, Gastrointestinal, Neurological, Skin, Genitourinary, Musculoskeletal, HENT, Cardiovascular, Respiratory, Psychiatric, Allergic/Imm, Heme/Lymph   Last edited by Doneen Poisson on 10/12/2019  1:59 PM. (History)       ALLERGIES No Known  Allergies  PAST MEDICAL HISTORY Past Medical History:  Diagnosis Date  . Asthma    as a child, no problems as an adult, no inhaler  . Cataract    NS OU  . Chronic hypertension during pregnancy, antepartum 08/19/2017  . Dehydration 01/28/2018  . Depression during pregnancy, antepartum 07/07/2017   6/20: Short trial of zoloft previously, reports didn't help much but also didn't give it a chance Discussed r/b/a SSRIs in pregnancy, agrees to try Zoloft again, rx sent No SI/HI/red flags  . Diabetes ([place])    TYPE I  . Diabetic retinopathy ([place]) 06/09/2017   07/2017 with bilateral severe diabetic non-proliferative retinopathy with macular edema.  Marland Kitchen HTN (hypertension)   . Hypertensive retinopathy    OU  . Hypokalemia 01/22/2018  . Hypomagnesemia 01/28/2018  . Intractable nausea and vomiting 01/22/2018  . Intrauterine growth restriction (IUGR) affecting care of mother 12/22/2017  . Morbid obesity ([place])   . Nephropathy, diabetic ([place]) 12/29/2017  . Proteinuria due to type 1 diabetes mellitus ([place]) 11/02/2017   Baseline Pr: Cr 10.23  . Severe hyperemesis gravidarum 10/30/2017  . Type I diabetes mellitus ([place]) 07/07/2017   Current Diabetic Medications:  Insulin  [x]  Aspirin 81 mg daily after 12 weeks (? A2/B GDM)  Required Referrals for A1GDM or A2GDM: [x]  Diabetes Education and Testing Supplies [x]  Nutrition Cousult  For A2/B GDM or higher classes of DM [x]  Diabetes Education and Testing Supplies [x]  Nutrition Counsult [x]  Fetal ECHO after 20 weeks  [x]  Eye exam for retina evaluation - severe retinopathy 7/19  Base  . Ventricular septal defect (VSD) of fetus in singleton pregnancy, antepartum 09/30/2017   May go to newborn nursery per Dr. Lenard Simmer Echo prior to discharge   Past Surgical History:  Procedure Laterality Date  . [place] VITRECTOMY WITH 20 GAUGE MVR PORT FOR MACULAR HOLE Left 07/20/2018   Procedure: 25 GAUGE PARS PLANA VITRECTOMY LEFT EYE;  Surgeon: Bernarda Caffey, MD;  Location: [facility];   Service: Ophthalmology;  Laterality: Left;  . CESAREAN SECTION N/A 12/26/2017   Procedure: CESAREAN SECTION;  Surgeon: Osborne Oman, MD;  Location: [facility];  Service: Obstetrics;  Laterality: N/A;  . EYE EXAMINATION UNDER ANESTHESIA Right 07/20/2018   Procedure: Eye Exam Under Anesthesia RIGHT EYE;  Surgeon: Bernarda Caffey, MD;  Location: [facility];  Service: Ophthalmology;  Laterality: Right;  . EYE SURGERY Left 07/2018  . GAS INSERTION Left 07/19/2019   Procedure: INSERTION OF GAS;  Surgeon: Bernarda Caffey, MD;  Location: [facility];  Service: Ophthalmology;  Laterality: Left;  SF6  . INJECTION OF SILICONE OIL Left 02/27/5619   Procedure: Injection Of Silicone Oil LEFT EYE;  Surgeon: Bernarda Caffey, MD;  Location: [facility];  Service: Ophthalmology;  Laterality: Left;  .  LASER PHOTO ABLATION Right 07/20/2018   Procedure: Laser Photo Ablation RIGHT EYE;  Surgeon: Bernarda Caffey, MD;  Location: [facility];  Service: Ophthalmology;  Laterality: Right;  . MEMBRANE PEEL Left 07/20/2018   Procedure: Membrane Peel LEFT EYE;  Surgeon: Bernarda Caffey, MD;  Location: [facility];  Service: Ophthalmology;  Laterality: Left;  . MEMBRANE PEEL Left 07/19/2019   Procedure: MEMBRANE PEEL;  Surgeon: Bernarda Caffey, MD;  Location: [facility];  Service: Ophthalmology;  Laterality: Left;  . MITOMYCIN C APPLICATION Bilateral 01/23/1094   Procedure: Avastin Application;  Surgeon: Bernarda Caffey, MD;  Location: [facility];  Service: Ophthalmology;  Laterality: Bilateral;  . PHOTOCOAGULATION WITH LASER Left 07/20/2018   Procedure: Photocoagulation With Laser LEFT EYE;  Surgeon: Bernarda Caffey, MD;  Location: [facility];  Service: Ophthalmology;  Laterality: Left;  . PHOTOCOAGULATION WITH LASER Left 07/19/2019   Procedure: PHOTOCOAGULATION WITH LASER;  Surgeon: Bernarda Caffey, MD;  Location: [facility];  Service: Ophthalmology;  Laterality: Left;  . RETINAL DETACHMENT SURGERY Left 07/20/2018   Dr. Bernarda Caffey  . SILICON OIL REMOVAL Left 07/19/2019   Procedure: 25g PARS  PLANA VITRECTOMY WITH SILICON OIL REMOVAL;  Surgeon: Bernarda Caffey, MD;  Location: [facility];  Service: Ophthalmology;  Laterality: Left;  . WISDOM TOOTH EXTRACTION      FAMILY HISTORY Family History  Problem Relation Age of Onset  . Diabetes Mother   . Aneurysm Mother 48  . Seizures Mother   . Diabetes Father   . Cataracts Father   . COPD Father   . Heart attack Father   . Heart disease Father   . Healthy Sister   . Healthy Daughter   . Stroke Maternal Grandfather   . Amblyopia Neg Hx   . Blindness Neg Hx   . Glaucoma Neg Hx   . Macular degeneration Neg Hx   . Retinal detachment Neg Hx   . Strabismus Neg Hx   . Retinitis pigmentosa Neg Hx     SOCIAL HISTORY Social History   Tobacco Use  . Smoking status: Never Smoker  . Smokeless tobacco: Never Used  Vaping Use  . Vaping Use: Never used  Substance Use Topics  . Alcohol use: Not Currently    Comment: SOCIALLY  . Drug use: Never         OPHTHALMIC EXAM:  Base Eye Exam    Visual Acuity (Snellen - Linear)      Right Left   Dist cc HM 20/200   Dist ph cc NI 20/150 -1   Correction: Glasses       Tonometry (Tonopen, 2:08 PM)      Right Left   Pressure 20 50       Tonometry #2 (Applanation, 2:08 PM)      Right Left   Pressure 19 50       Tonometry #3 (Tonopen, 3:20 PM)      Right Left   Pressure  42       Tonometry Comments   1 gtt Cosopt OS @ 2:14 1 gtt Brimonidine OS @ 2:15       Pupils      Dark Light Shape React APD   Right 3 2 Round Brisk 0   Left 3 2 Round Brisk 0       Visual Fields      Left Right   Restrictions Total inferior nasal deficiency Total superior temporal, inferior temporal, superior nasal, inferior nasal deficiencies       Extraocular Movement  Right Left    Full Full       Neuro/Psych    Oriented x3: Yes   Mood/Affect: Normal       Dilation    Both eyes: 1.0% Mydriacyl, 2.5% Phenylephrine @ 2:16 PM        Slit Lamp and Fundus Exam    Slit Lamp Exam       Right Left   Lids/Lashes Normal Normal   Conjunctiva/Sclera White and quiet White and quiet   Cornea Clear Well healed cataract wounds, clear   Anterior Chamber Deep and quiet Deep and quiet   Iris Round and reactive Round and dilated to 6.77mm, No NVI   Lens 1-2+Nuclear sclerosis, 1-2+ Cortical cataract PCIOL in good position; 2+ PCO   Vitreous Vitreous syneresis, diffuse RBC post vitrectomy, gas bubble gone, trace pigment       Fundus Exam      Right Left   Disc  mild Pallor, Sharp rim   C/D Ratio 0.3 0.6   Macula no details visible flat, Blunted foveal reflex, ERM gone, scattered Microaneurysms, mild improvement in nasal thickening   Vessels No details visible Attenuated, Tortuous, sclerotic arterioles   Periphery Very hazy view, grossly attached, PRP visible nasally/temporally/superiorly, blood clots inferiorly Attached, peripapillary fibrosis regressing, good posterior PRP laser changes        Refraction    Wearing Rx      Sphere Cylinder   Right -2.00 Sphere   Left -1.50 Sphere          IMAGING AND PROCEDURES  Imaging and Procedures for @TODAY @  OCT, Retina - OU - Both Eyes       Right Eye Quality was poor. Progression has worsened. Findings include no SRF, intraretinal hyper-reflective material, no IRF, normal foveal contour, vitreous traction, macular pucker, epiretinal membrane, preretinal fibrosis (No rasters obtained -- dense VH).   Left Eye Quality was borderline. Central Foveal Thickness: 353. Progression has improved. Findings include abnormal foveal contour, epiretinal membrane, no SRF, outer retinal atrophy, intraretinal fluid (Interval improvement in nasal retinal thickening with overlying pucker; stable improvement in ERM; interval progression of central ORA/SRHM).   Notes *Images captured and stored on drive  Diagnosis / Impression:  PDR OU OD: no rasters obtained -- dense VH OS: Interval improvement in nasal retinal thickening with overlying  pucker; stable improvement in ERM; interval progression of central ORA/SRHM   Clinical management:  See below Abbreviations: NFP - Normal foveal profile. CME - cystoid macular edema. PED - pigment epithelial detachment. IRF - intraretinal fluid. SRF - subretinal fluid. EZ - ellipsoid zone. ERM - epiretinal membrane. ORA - outer retinal atrophy. ORT - outer retinal tubulation. SRHM - subretinal hyper-reflective material         Intravitreal Injection, Pharmacologic Agent - OD - Right Eye       Time Out 10/12/2019. 3:46 PM. Confirmed correct patient, procedure, site, and patient consented.   Anesthesia Topical anesthesia was used. Anesthetic medications included Lidocaine 2%, Proparacaine 0.5%.   Procedure Preparation included 5% betadine to ocular surface, eyelid speculum. A supplied needle was used.   Injection:  1.25 mg Bevacizumab (AVASTIN) SOLN   NDC: 77824-235-36, Lot: [id number] , Expiration date: 11/27/2019   Route: Intravitreal, Site: Right Eye, Waste: 0 mL  Post-op Post injection exam found visual acuity of at least counting fingers. The patient tolerated the procedure well. There were no complications. The patient received written and verbal post procedure care education. Post injection medications were not given.  ASSESSMENT/PLAN:    ICD-10-CM   1. 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 Traction detachment of left retina  H33.42   3. Vitreous hemorrhage of both eyes (HCC)  H43.13 Intravitreal Injection, Pharmacologic Agent - OD - Right Eye    Bevacizumab (AVASTIN) SOLN 1.25 mg  4. Epiretinal membrane (ERM) of left eye  H35.372   5. Retinal edema  H35.81 OCT, Retina - OU - Both Eyes  6. Essential hypertension  I10   7. Hypertensive retinopathy of both eyes  H35.033   8. Combined  forms of age-related cataract of right eye  H25.811   9. Pseudophakia  Z96.1     1-3. 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 (OS > OD)  - delayed to f/u from 1 weeks to 2 months due to losing job/insurance  - s/p PRP OS (11.26.19)  - s/p PRP OD (06.01.20)   - lost to f/u following 11.26.19 appt due to complicated pregnancy, premature birth of baby w/ extended NICU stay and congenital heart defect, and post-partum health issues OS  - history of vision loss OS onset December 2019  - OS with chronic VH and TRD, likely present since Dec 2019  - s/p IVA OS #1 (06.23.20), #2 (07.02.20), #3 (04.02.21)  - s/p IVA OD #2 (11.30.20)  - s/p PPV/MP/EL/silicon oil + IVA OS for VH and TRD from DM1, 07.02.20             - had cataract surgery OS with Dr. Eulas Post, 12.15.20 -- looks good, mild PCO  - s/p PPV/SOR removal/Tissue Blue stain/MP/20% SF6 gas, IVA OS, 07.01.21             - IOP 50,42 (OS)             - restart Brimonidine/Cosopt TID OS  - add latanoprost QHS OS  - pt has not used any drops since the end of August  - f/u Monday, IOP check  OD  - today, new onset VH onset, 09.23.21 by pt history  - s/p fill in PRP and IVA OD 07.02.2020 in operating room  - diffuse VH OD ~11.26.20   - s/p IVA OD #2 (11.30.20)  - BCVA decreased to HM from 20/20  - recommend IVA OD #3, 09.24.21 for new VH  - pt wishes to proceed  - RBA of procedure discussed, questions answered  - informed consent obtained and signed  - see procedure note  - f/u Monday VH, DFE, OCT  4,5. Epiretinal membrane, left eye -- improved  - focal ERM with retinal thickening nasal macula   - s/p TissueBlue assisted membrane peel OS as above (7.1.21)  - OCT today shows interval improvement in nasal thickening post-ERM peel   6,7. Hypertensive retinopathy OU  - there was concern for pregnancy related hypertension (I.e. Pre-eclampsia, Eclampsia, HELLP, etc) during pregnancy  - also during pregnancy (7.17.2019), had  bilateral CRVO w/ CME (OD > OS) s/p PRP OS  - discussed importance of tight BP control  - monitor  8. Mixed cataract OD  - The symptoms of cataract, surgical options, and treatments and risks were discussed with patient.  - not visually significant    9. Pseudophakia OS  - s/p CE/IOL OS w/ Dr. Eulas Post, 12.15.2020  - beautiful surgery, doing well    Ophthalmic Meds Ordered this  visit:  Meds ordered this encounter  Medications  . Bevacizumab (AVASTIN) SOLN 1.25 mg      Return in about 3 days (around 10/15/2019) for f/u Monday, IOP check, VH OD, DFE, OCT.  There are no Patient Instructions on file for this visit.   Explained the diagnoses, plan, and follow up with the patient and they expressed understanding.  Patient expressed understanding of the importance of proper follow up care.  This document serves as a record of services personally performed by Gardiner Sleeper, MD, PhD. It was created on their behalf by San Jetty. Owens Shark, OA an ophthalmic technician. The creation of this record is the provider's dictation and/or activities during the visit.    Electronically signed by: San Jetty. Owens Shark, [place] 09.24.2021 11:08 PM  Gardiner Sleeper, M.D., Ph.D. Diseases & Surgery of the Retina and Vitreous [facility]  I have reviewed the above documentation for accuracy and completeness, and I agree with the above. Gardiner Sleeper, M.D., Ph.D. 10/13/19 11:08 PM   Abbreviations: M myopia (nearsighted); A astigmatism; H hyperopia (farsighted); P presbyopia; Mrx spectacle prescription;  CTL contact lenses; OD right eye; OS left eye; OU both eyes  XT exotropia; ET esotropia; PEK punctate epithelial keratitis; PEE punctate epithelial erosions; DES dry eye syndrome; MGD meibomian gland dysfunction; ATs artificial tears; PFAT's preservative free artificial tears; [place] nuclear sclerotic cataract; PSC posterior subcapsular cataract; ERM epi-retinal membrane; PVD posterior vitreous  detachment; RD retinal detachment; DM diabetes mellitus; DR diabetic retinopathy; NPDR non-proliferative diabetic retinopathy; PDR proliferative diabetic retinopathy; CSME clinically significant macular edema; DME diabetic macular edema; dbh dot blot hemorrhages; CWS cotton wool spot; POAG primary open angle glaucoma; C/D cup-to-disc ratio; HVF humphrey visual field; GVF goldmann visual field; OCT optical coherence tomography; IOP intraocular pressure; BRVO Branch retinal vein occlusion; CRVO central retinal vein occlusion; CRAO central retinal artery occlusion; BRAO branch retinal artery occlusion; RT retinal tear; SB scleral buckle; PPV pars plana vitrectomy; VH Vitreous hemorrhage; PRP panretinal laser photocoagulation; IVK intravitreal kenalog; VMT vitreomacular traction; MH Macular hole;  NVD neovascularization of the disc; NVE neovascularization elsewhere; AREDS age related eye disease study; ARMD age related macular degeneration; POAG primary open angle glaucoma; EBMD epithelial/anterior basement membrane dystrophy; ACIOL anterior chamber intraocular lens; IOL intraocular lens; PCIOL posterior chamber intraocular lens; Phaco/IOL phacoemulsification with intraocular lens placement; [place] photorefractive keratectomy; LASIK laser assisted in situ keratomileusis; HTN hypertension; DM diabetes mellitus; COPD chronic obstructive pulmonary disease

## 2019-10-13 ENCOUNTER — Encounter (INDEPENDENT_AMBULATORY_CARE_PROVIDER_SITE_OTHER): Payer: Self-pay | Admitting: Ophthalmology

## 2019-10-13 DIAGNOSIS — H4313 Vitreous hemorrhage, bilateral: Secondary | ICD-10-CM

## 2019-10-13 DIAGNOSIS — E103513 Type 1 diabetes mellitus with proliferative diabetic retinopathy with macular edema, bilateral: Secondary | ICD-10-CM

## 2019-10-13 MED ORDER — BEVACIZUMAB CHEMO INJECTION 1.25MG/0.05ML SYRINGE FOR KALEIDOSCOPE
1.2500 mg | INTRAVITREAL | Status: AC | PRN
  Administered 2019-10-13: 1.25 mg via INTRAVITREAL

## 2019-10-15 ENCOUNTER — Encounter (INDEPENDENT_AMBULATORY_CARE_PROVIDER_SITE_OTHER): Payer: Self-pay | Admitting: Ophthalmology

## 2019-10-15 ENCOUNTER — Other Ambulatory Visit: Payer: Self-pay

## 2019-10-15 ENCOUNTER — Ambulatory Visit (INDEPENDENT_AMBULATORY_CARE_PROVIDER_SITE_OTHER): Payer: Self-pay | Admitting: Ophthalmology

## 2019-10-15 DIAGNOSIS — H35033 Hypertensive retinopathy, bilateral: Secondary | ICD-10-CM

## 2019-10-15 DIAGNOSIS — E103513 Type 1 diabetes mellitus with proliferative diabetic retinopathy with macular edema, bilateral: Secondary | ICD-10-CM

## 2019-10-15 DIAGNOSIS — H25811 Combined forms of age-related cataract, right eye: Secondary | ICD-10-CM

## 2019-10-15 DIAGNOSIS — I10 Essential (primary) hypertension: Secondary | ICD-10-CM

## 2019-10-15 DIAGNOSIS — Z3A15 15 weeks gestation of pregnancy: Secondary | ICD-10-CM

## 2019-10-15 DIAGNOSIS — H3581 Retinal edema: Secondary | ICD-10-CM

## 2019-10-15 DIAGNOSIS — H35372 Puckering of macula, left eye: Secondary | ICD-10-CM

## 2019-10-15 DIAGNOSIS — H3342 Traction detachment of retina, left eye: Secondary | ICD-10-CM

## 2019-10-15 DIAGNOSIS — Z961 Presence of intraocular lens: Secondary | ICD-10-CM

## 2019-10-15 DIAGNOSIS — H34813 Central retinal vein occlusion, bilateral, with macular edema: Secondary | ICD-10-CM

## 2019-10-15 DIAGNOSIS — E103313 Type 1 diabetes mellitus with moderate nonproliferative diabetic retinopathy with macular edema, bilateral: Secondary | ICD-10-CM

## 2019-10-15 DIAGNOSIS — H2513 Age-related nuclear cataract, bilateral: Secondary | ICD-10-CM

## 2019-10-15 DIAGNOSIS — Z3A33 33 weeks gestation of pregnancy: Secondary | ICD-10-CM

## 2019-10-15 DIAGNOSIS — H25813 Combined forms of age-related cataract, bilateral: Secondary | ICD-10-CM

## 2019-10-15 DIAGNOSIS — H4312 Vitreous hemorrhage, left eye: Secondary | ICD-10-CM

## 2019-10-15 DIAGNOSIS — H4313 Vitreous hemorrhage, bilateral: Secondary | ICD-10-CM

## 2019-10-15 NOTE — Progress Notes (Signed)
[facility] Note  10/15/2019     CHIEF COMPLAINT Patient presents for Retina Follow Up   HISTORY OF PRESENT ILLNESS: Barbara Donovan is a 29 y.o. female who presents to the clinic today for:   HPI    Retina Follow Up    Patient presents with  Diabetic Retinopathy.  In both eyes.  This started days ago.  Severity is moderate.  Duration of days.  Since onset it is gradually improving.  I, the attending physician,  performed the HPI with the patient and updated documentation appropriately.          Comments    Pt states vision is better since last visit OD.  Patient denies eye pain or discomfort and denies any new or worsening floaters or fol OU.       Last edited by Bernarda Caffey, MD on 10/15/2019  1:16 PM. (History)    pt is   Referring physician: Fanny Bien, MD [address] [place],  [place] [address]  HISTORICAL INFORMATION:   Selected notes from the MEDICAL RECORD NUMBER Referred by Dr. [name] for concern of bilateral CRVO    CURRENT MEDICATIONS: No current outpatient medications on file. (Ophthalmic Drugs)   No current facility-administered medications for this visit. (Ophthalmic Drugs)   Current Outpatient Medications (Other)  Medication Sig   Blood Pressure Monitoring (OMRON 3 SERIES BP MONITOR) DEVI 1 each by Does not apply route daily. Take daily blood pressure. Please report back to Dr. Radford Pax in 2 wks.   carvedilol (COREG) 25 MG tablet Take 1 tablet (25 mg total) by mouth 2 (two) times daily.   diltiazem (CARDIZEM CD) 240 MG 24 hr capsule Take 1 capsule (240 mg total) by mouth daily.   furosemide (LASIX) 20 MG tablet Take 2 tablets (40 mg total) by mouth daily as needed for edema. (Patient taking differently: Take 20 mg by mouth daily as needed for edema. )   hydrALAZINE (APRESOLINE) 50 MG tablet Take 50 mg by mouth three times daily   insulin aspart (NOVOLOG) 100 UNIT/ML injection FOR USE IN PUMP, TOTAL OF 80  UNITS PER DAY. (Patient taking differently: Inject 80 Units into the skin daily. FOR USE IN PUMP, TOTAL OF 80 UNITS PER DAY.)   Insulin Pen Needle (PEN NEEDLES) 32G X 5 MM MISC 1 Device by Does not apply route 4 (four) times daily. (Patient not taking: Reported on 09/14/2019)   irbesartan (AVAPRO) 300 MG tablet Take 300 mg by mouth daily.   metoCLOPramide (REGLAN) 10 MG tablet Take 1 tablet (10 mg total) by mouth every 8 (eight) hours as needed for up to 3 days for nausea.   ondansetron (ZOFRAN-ODT) 8 MG disintegrating tablet Take 1 tablet (8 mg total) by mouth every 8 (eight) hours as needed for nausea or vomiting.   ONETOUCH VERIO test strip USE AS DIRECTED 5 TIMES DAILY   potassium chloride (KLOR-CON) 10 MEQ tablet Take 2 tablets (20 mEq total) by mouth daily for 2 days.   promethazine (PHENERGAN) 25 MG tablet Take 1 tablet (25 mg total) by mouth every 6 (six) hours as needed for nausea or vomiting.   No current facility-administered medications for this visit. (Other)      REVIEW OF SYSTEMS: ROS    Positive for: Endocrine, Eyes   Negative for: Constitutional, Gastrointestinal, Neurological, Skin, Genitourinary, Musculoskeletal, HENT, Cardiovascular, Respiratory, Psychiatric, Allergic/Imm, Heme/Lymph   Last edited by Doneen Poisson on 10/15/2019  8:25 AM. (History)       ALLERGIES No Known Allergies  PAST MEDICAL HISTORY Past Medical History:  Diagnosis Date   Asthma    as a child, no problems as an adult, no inhaler   Cataract    NS OU   Chronic hypertension during pregnancy, antepartum 08/19/2017   Dehydration 01/28/2018   Depression during pregnancy, antepartum 07/07/2017   6/20: Short trial of zoloft previously, reports didn't help much but also didn't give it a chance Discussed r/b/a SSRIs in pregnancy, agrees to try Zoloft again, rx sent No SI/HI/red flags   Diabetes ([place])    TYPE I   Diabetic retinopathy ([place]) 06/09/2017   07/2017 with bilateral severe  diabetic non-proliferative retinopathy with macular edema.   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 A2/B GDM)  Required Referrals for A1GDM or A2GDM: [x]  Diabetes Education and Testing Supplies [x]  Nutrition Cousult  For A2/B GDM or higher classes of DM [x]  Diabetes Education and Testing Supplies [x]  Nutrition Counsult [x]  Fetal ECHO after 20 weeks  [x]  Eye exam for retina evaluation - severe retinopathy 7/19  Base   Ventricular septal defect (VSD) of fetus in singleton pregnancy, antepartum 09/30/2017   May go to newborn nursery per Dr. Lenard Simmer Echo prior to discharge   Past Surgical History:  Procedure Laterality Date   25 GAUGE PARS PLANA VITRECTOMY WITH 20 GAUGE MVR PORT FOR MACULAR HOLE Left 07/20/2018   Procedure: 25 [place] VITRECTOMY LEFT EYE;  Surgeon: Bernarda Caffey, MD;  Location: [facility];  Service: Ophthalmology;  Laterality: Left;   CESAREAN SECTION N/A 12/26/2017   Procedure: CESAREAN SECTION;  Surgeon: Osborne Oman, MD;  Location: [facility];  Service: Obstetrics;  Laterality: N/A;   EYE EXAMINATION UNDER ANESTHESIA Right 07/20/2018   Procedure: Eye Exam Under Anesthesia RIGHT EYE;  Surgeon: Bernarda Caffey, MD;  Location: [facility];  Service: Ophthalmology;  Laterality: Right;   EYE SURGERY Left 07/2018   GAS INSERTION Left 07/19/2019   Procedure: INSERTION OF GAS;  Surgeon: Bernarda Caffey, MD;  Location: [facility];  Service: Ophthalmology;  Laterality: Left;  SF6    INJECTION OF SILICONE OIL Left 0/01/270   Procedure: Injection Of Silicone Oil LEFT EYE;  Surgeon: Bernarda Caffey, MD;  Location: [facility];  Service: Ophthalmology;  Laterality: Left;   LASER PHOTO ABLATION Right 07/20/2018   Procedure: Laser Photo Ablation RIGHT EYE;  Surgeon: Bernarda Caffey, MD;  Location: [facility];  Service: Ophthalmology;  Laterality: Right;   MEMBRANE PEEL Left 07/20/2018   Procedure: Membrane Peel LEFT EYE;  Surgeon: Bernarda Caffey, MD;  Location: [facility];  Service: Ophthalmology;  Laterality: Left;   MEMBRANE PEEL Left 07/19/2019   Procedure: MEMBRANE PEEL;  Surgeon: Bernarda Caffey, MD;  Location: [facility];  Service: Ophthalmology;  Laterality: Left;   MITOMYCIN C APPLICATION Bilateral 05/21/6642   Procedure: Avastin Application;  Surgeon: Bernarda Caffey, MD;  Location: [facility];  Service: Ophthalmology;  Laterality: Bilateral;  PHOTOCOAGULATION WITH LASER Left 07/20/2018   Procedure: Photocoagulation With Laser LEFT EYE;  Surgeon: Bernarda Caffey, MD;  Location: [facility];  Service: Ophthalmology;  Laterality: Left;   PHOTOCOAGULATION WITH LASER Left 07/19/2019   Procedure: PHOTOCOAGULATION WITH LASER;  Surgeon: Bernarda Caffey, MD;  Location: [facility];  Service: Ophthalmology;  Laterality: Left;   RETINAL DETACHMENT SURGERY Left 07/20/2018   Dr. Bernarda Caffey   SILICON OIL REMOVAL Left 07/19/2019   Procedure: 25g PARS PLANA VITRECTOMY WITH SILICON OIL REMOVAL;  Surgeon: Bernarda Caffey, MD;  Location: [facility];  Service: Ophthalmology;  Laterality: Left;   WISDOM TOOTH EXTRACTION      FAMILY HISTORY Family History  Problem Relation Age of Onset   Diabetes Mother    Aneurysm Mother 12   Seizures Mother    Diabetes Father    Cataracts Father    COPD Father    Heart attack Father    Heart disease Father    Healthy Sister    Healthy Daughter    Stroke Maternal Grandfather    Amblyopia Neg Hx    Blindness Neg Hx    Glaucoma Neg Hx    Macular degeneration Neg Hx    Retinal  detachment Neg Hx    Strabismus Neg Hx    Retinitis pigmentosa Neg Hx     SOCIAL HISTORY Social History   Tobacco Use   Smoking status: Never Smoker   Smokeless tobacco: Never Used  Vaping Use   Vaping Use: Never used  Substance Use Topics   Alcohol use: Not Currently    Comment: SOCIALLY   Drug use: Never         OPHTHALMIC EXAM:  Base Eye Exam    Visual Acuity (Snellen - Linear)      Right Left   Dist cc 20/25 -1 20/200 -1   Dist ph cc NI 20/150 -1   Correction: Glasses       Tonometry (Tonopen, 8:31 AM)      Right Left   Pressure 17 19       Pupils      Dark Light Shape React APD   Right 3 2 Round Brisk 0   Left 3 2 Round Brisk 0       Visual Fields      Left Right     Full   Restrictions Partial outer superior nasal deficiency        Extraocular Movement      Right Left    Full Full       Neuro/Psych    Oriented x3: Yes   Mood/Affect: Normal       Dilation    Both eyes: 1.0% Mydriacyl, 2.5% Phenylephrine @ 8:32 AM        Slit Lamp and Fundus Exam    Slit Lamp Exam      Right Left   Lids/Lashes Normal Normal   Conjunctiva/Sclera White and quiet White and quiet   Cornea Clear Well healed cataract wounds, clear   Anterior Chamber Deep and quiet Deep and quiet   Iris Round and reactive Round and dilated to 6.65mm, No NVI   Lens 1-2+Nuclear sclerosis, 1-2+ Cortical cataract PCIOL in good position; 2+ PCO   Vitreous Vitreous syneresis, diffuse RBC -- vastly improved, VH clearing centrally and settling inferiorly post vitrectomy, gas bubble gone, trace pigment       Fundus Exam      Right Left   Disc mild Pallor, Sharp rim mild Pallor, Sharp rim  C/D Ratio 0.3 0.6   Macula Flat, good foveal reflex, scattered MA/IRH/exudate - improved, +ERM flat, Blunted foveal reflex, ERM gone, scattered Microaneurysms, mild improvement in nasal thickening   Vessels Vascular attenuation, Tortuous, regressing fibrotic NV Attenuated, Tortuous,  sclerotic arterioles   Periphery Attached, scattered IRH. 360 PRP, pre-retinal hemorrhage inferiorly, inferior periphery partially obscured Attached, peripapillary fibrosis regressing, good posterior PRP laser changes        Refraction    Wearing Rx      Sphere Cylinder   Right -2.00 Sphere   Left -1.50 Sphere          IMAGING AND PROCEDURES  Imaging and Procedures for @TODAY @  OCT, Retina - OU - Both Eyes       Right Eye Quality was borderline. Progression has improved. Findings include no SRF, intraretinal hyper-reflective material, no IRF, normal foveal contour, macular pucker, epiretinal membrane, preretinal fibrosis (Interval improvement in vitreous opacities).   Left Eye Quality was good. Central Foveal Thickness: 356. Progression has been stable. Findings include abnormal foveal contour, epiretinal membrane, no SRF, outer retinal atrophy, intraretinal fluid (Persistent nasal retinal thickening with overlying pucker; stable improvement in ERM; persistent central ORA/SRHM).   Notes *Images captured and stored on drive  Diagnosis / Impression:  PDR OU OD: Vast interval improvement in vitreous opacities OS: persistent nasal retinal thickening with overlying pucker; stable improvement in ERM; persistent central ORA/SRHM   Clinical management:  See below Abbreviations: NFP - Normal foveal profile. CME - cystoid macular edema. PED - pigment epithelial detachment. IRF - intraretinal fluid. SRF - subretinal fluid. EZ - ellipsoid zone. ERM - epiretinal membrane. ORA - outer retinal atrophy. ORT - outer retinal tubulation. SRHM - subretinal hyper-reflective material                  ASSESSMENT/PLAN:    ICD-10-CM   1. 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 Traction detachment of left retina  H33.42   3. Vitreous hemorrhage of both eyes ([place])  H43.13   4. Epiretinal membrane (ERM) of left eye   H35.372   5. Retinal edema  H35.81 OCT, Retina - OU - Both Eyes  6. Essential hypertension  I10   7. Hypertensive retinopathy of both eyes  H35.033   8. Pseudophakia  Z96.1   9. Combined forms of age-related cataract of right eye  H25.811   10. Combined forms of age-related cataract of both eyes  H25.813   11. Central retinal vein occlusion with macular edema of both eyes  H34.8130   12. Nuclear sclerosis of both eyes  H25.13   13. Type 1 diabetes mellitus with moderate nonproliferative retinopathy of both eyes and macular edema ([place])  E10.3313   14. Vitreous hemorrhage of left eye (H[facility])  H43.12   15. [redacted] weeks gestation of pregnancy  Z3A.33   16. [redacted] weeks gestation of pregnancy  Z3A.15     1-3. 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 (OS > OD)  - s/p PRP OS (11.26.19)  - s/p PRP OD (06.01.20)   - lost to f/u following 11.26.19 appt due to complicated pregnancy, premature birth of baby w/ extended NICU stay and congenital heart defect, and post-partum health issues OS  - history of vision loss OS onset December 2019  - OS with chronic VH and TRD, likely present since Dec 2019  - s/p IVA OS #1 (06.23.20), #2 (07.02.20), #3 (04.02.21)  -  s/p IVA OD #2 (11.30.20)  - s/p PPV/MP/EL/silicon oil + IVA OS for VH and TRD from DM1, 07.02.20             - had cataract surgery OS with Dr. Eulas Post, 12.15.20 -- looks good, mild PCO  - s/p PPV/SOR removal/Tissue Blue stain/MP/20% SF6 gas, IVA OS, 07.01.21             - IOP 17 (OS) -- vastly improved from 40+ last week             - continue Brimonidine/Cosopt TID OS  - continue latanoprost QHS OS  - f/u 4 weeks, DFE, OCT  OD  - today, VH significantly improved (amazing response to IVA OD #3, 9.27.21)  - s/p fill in PRP and IVA OD 07.02.2020 in operating room  - diffuse VH OD ~11.26.20   - s/p IVA OD #2 (11.30.20), #3 (09.24.21)  - BCVA improved to 20/25 from HM  - f/u 4 weeks, DFE, OCT  4,5. Epiretinal membrane, left eye --  improved  - focal ERM with retinal thickening nasal macula   - s/p TissueBlue assisted membrane peel OS as above (7.1.21)  - OCT today shows persistent nasal thickening post-ERM peel   6,7. Hypertensive retinopathy OU  - there was concern for pregnancy related hypertension (I.e. Pre-eclampsia, Eclampsia, HELLP, etc) during pregnancy  - also during pregnancy (7.17.2019), had bilateral CRVO w/ CME (OD > OS) s/p PRP OS  - discussed importance of tight BP control  - monitor  8. Mixed cataract OD  - The symptoms of cataract, surgical options, and treatments and risks were discussed with patient.  - not visually significant    9. Pseudophakia OS  - s/p CE/IOL OS w/ Dr. Eulas Post, 12.15.2020  - beautiful surgery, doing well    Ophthalmic Meds Ordered this visit:  No orders of the defined types were placed in this encounter.     This document serves as a record of services personally performed by Gardiner Sleeper, MD, PhD. It was created on their behalf by Leeann Must, [place], an ophthalmic technician. The creation of this record is the provider's dictation and/or activities during the visit.    Electronically signed by: Leeann Must, COA 09.27.2021 1:17 PM   This document serves as a record of services personally performed by Gardiner Sleeper, MD, PhD. It was created on their behalf by San Jetty. Owens Shark, OA an ophthalmic technician. The creation of this record is the provider's dictation and/or activities during the visit.    Electronically signed by: San Jetty. Owens Shark, [place] 09.27.2021 1:17 PM  Gardiner Sleeper, M.D., Ph.D. Diseases & Surgery of the Retina and [place] 10/15/2019   I have reviewed the above documentation for accuracy and completeness, and I agree with the above. Gardiner Sleeper, M.D., Ph.D. 10/15/19 1:19 PM   Abbreviations: M myopia (nearsighted); A astigmatism; H hyperopia (farsighted); P presbyopia; Mrx spectacle prescription;  CTL  contact lenses; OD right eye; OS left eye; OU both eyes  XT exotropia; ET esotropia; PEK punctate epithelial keratitis; PEE punctate epithelial erosions; DES dry eye syndrome; MGD meibomian gland dysfunction; ATs artificial tears; PFAT's preservative free artificial tears; [place] nuclear sclerotic cataract; PSC posterior subcapsular cataract; ERM epi-retinal membrane; PVD posterior vitreous detachment; RD retinal detachment; DM diabetes mellitus; DR diabetic retinopathy; NPDR non-proliferative diabetic retinopathy; PDR proliferative diabetic retinopathy; CSME clinically significant macular edema; DME diabetic macular edema; dbh dot blot hemorrhages; CWS cotton wool spot;  POAG primary open angle glaucoma; C/D cup-to-disc ratio; HVF humphrey visual field; GVF goldmann visual field; OCT optical coherence tomography; IOP intraocular pressure; BRVO Branch retinal vein occlusion; CRVO central retinal vein occlusion; CRAO central retinal artery occlusion; BRAO branch retinal artery occlusion; RT retinal tear; SB scleral buckle; PPV pars plana vitrectomy; VH Vitreous hemorrhage; PRP panretinal laser photocoagulation; IVK intravitreal kenalog; VMT vitreomacular traction; MH Macular hole;  NVD neovascularization of the disc; NVE neovascularization elsewhere; AREDS age related eye disease study; ARMD age related macular degeneration; POAG primary open angle glaucoma; EBMD epithelial/anterior basement membrane dystrophy; ACIOL anterior chamber intraocular lens; IOL intraocular lens; PCIOL posterior chamber intraocular lens; Phaco/IOL phacoemulsification with intraocular lens placement; [place] photorefractive keratectomy; LASIK laser assisted in situ keratomileusis; HTN hypertension; DM diabetes mellitus; COPD chronic obstructive pulmonary disease

## 2019-10-19 ENCOUNTER — Ambulatory Visit: Payer: BC Managed Care – PPO | Admitting: Endocrinology

## 2019-11-12 ENCOUNTER — Encounter (INDEPENDENT_AMBULATORY_CARE_PROVIDER_SITE_OTHER): Payer: Self-pay | Admitting: Ophthalmology

## 2019-11-12 DIAGNOSIS — Z961 Presence of intraocular lens: Secondary | ICD-10-CM

## 2019-11-12 DIAGNOSIS — H25811 Combined forms of age-related cataract, right eye: Secondary | ICD-10-CM

## 2019-11-12 DIAGNOSIS — I10 Essential (primary) hypertension: Secondary | ICD-10-CM

## 2019-11-12 DIAGNOSIS — H3581 Retinal edema: Secondary | ICD-10-CM

## 2019-11-12 DIAGNOSIS — H35372 Puckering of macula, left eye: Secondary | ICD-10-CM

## 2019-11-12 DIAGNOSIS — Z3A33 33 weeks gestation of pregnancy: Secondary | ICD-10-CM

## 2019-11-12 DIAGNOSIS — H4313 Vitreous hemorrhage, bilateral: Secondary | ICD-10-CM

## 2019-11-12 DIAGNOSIS — H3342 Traction detachment of retina, left eye: Secondary | ICD-10-CM

## 2019-11-12 DIAGNOSIS — H35033 Hypertensive retinopathy, bilateral: Secondary | ICD-10-CM

## 2019-11-12 DIAGNOSIS — E103513 Type 1 diabetes mellitus with proliferative diabetic retinopathy with macular edema, bilateral: Secondary | ICD-10-CM

## 2019-11-12 DIAGNOSIS — H4312 Vitreous hemorrhage, left eye: Secondary | ICD-10-CM

## 2019-11-12 DIAGNOSIS — H34813 Central retinal vein occlusion, bilateral, with macular edema: Secondary | ICD-10-CM

## 2019-11-12 DIAGNOSIS — Z3A15 15 weeks gestation of pregnancy: Secondary | ICD-10-CM

## 2019-11-12 DIAGNOSIS — H2513 Age-related nuclear cataract, bilateral: Secondary | ICD-10-CM

## 2019-11-12 DIAGNOSIS — H25813 Combined forms of age-related cataract, bilateral: Secondary | ICD-10-CM

## 2019-11-12 DIAGNOSIS — E103313 Type 1 diabetes mellitus with moderate nonproliferative diabetic retinopathy with macular edema, bilateral: Secondary | ICD-10-CM

## 2019-11-23 ENCOUNTER — Other Ambulatory Visit: Payer: Self-pay | Admitting: Endocrinology

## 2019-11-23 DIAGNOSIS — E10319 Type 1 diabetes mellitus with unspecified diabetic retinopathy without macular edema: Secondary | ICD-10-CM

## 2019-11-23 NOTE — Telephone Encounter (Signed)
please contact patient: novolog is fine--same dosage.   Please schedule f/u appt Then please refill x 1, pending that appt.

## 2019-11-28 ENCOUNTER — Encounter: Payer: Self-pay | Admitting: Gastroenterology

## 2019-11-28 ENCOUNTER — Ambulatory Visit (INDEPENDENT_AMBULATORY_CARE_PROVIDER_SITE_OTHER): Payer: 59 | Admitting: Gastroenterology

## 2019-11-28 VITALS — BP 128/80 | HR 86 | Ht 66.0 in | Wt 179.0 lb

## 2019-11-28 DIAGNOSIS — R1012 Left upper quadrant pain: Secondary | ICD-10-CM | POA: Diagnosis not present

## 2019-11-28 DIAGNOSIS — R112 Nausea with vomiting, unspecified: Secondary | ICD-10-CM | POA: Diagnosis not present

## 2019-11-28 DIAGNOSIS — R634 Abnormal weight loss: Secondary | ICD-10-CM | POA: Diagnosis not present

## 2019-11-28 MED ORDER — PROMETHAZINE HCL 25 MG PO TABS: 25.0000 mg | ORAL_TABLET | Freq: Four times a day (QID) | ORAL | 2 refills | Status: DC | PRN

## 2019-11-28 NOTE — Progress Notes (Signed)
History of Present Illness: This is a 29 year old female referred by Fanny Bien, MD for the evaluation of nausea and vomiting, intermittent LUQ pain, weight loss.  She has a longstanding type 1 diabetes mellitus. She has been evaluated in the ED and admitted for refractory nausea and vomiting as well as diabetic ketoacidosis.  She has an insulin pump and is followed by Dr. Renato Shin.  She has occasional mild left upper quadrant pain.  She notes a gradual weight loss of about 30 pounds.  She states she frequently goes on a clear liquid diet for a few days when she has episodes of nausea and vomiting.  Denies abdominal pain, constipation, diarrhea, change in stool caliber, melena, hematochezia, nausea, vomiting, dysphagia, reflux symptoms, chest pain.    No Known Allergies Outpatient Medications Prior to Visit  Medication Sig Dispense Refill  . Blood Pressure Monitoring (OMRON 3 SERIES BP MONITOR) DEVI 1 each by Does not apply route daily. Take daily blood pressure. Please report back to Dr. Radford Pax in 2 wks. 1 Device 0  . carvedilol (COREG) 25 MG tablet Take 1 tablet (25 mg total) by mouth 2 (two) times daily. 180 tablet 3  . diltiazem (CARDIZEM CD) 240 MG 24 hr capsule Take 1 capsule (240 mg total) by mouth daily. 30 capsule 6  . hydrALAZINE (APRESOLINE) 50 MG tablet Take 50 mg by mouth three times daily 180 tablet 1  . insulin lispro (HUMALOG) 100 UNIT/ML injection Inject 0.8 mLs (80 Units total) into the skin daily. FOR USE IN PUMP, TOTAL OF 80 UNITS PER DAY. 30 mL 1  . Insulin Pen Needle (PEN NEEDLES) 32G X 5 MM MISC 1 Device by Does not apply route 4 (four) times daily. 120 each 11  . irbesartan (AVAPRO) 300 MG tablet Take 300 mg by mouth daily.    . metoCLOPramide (REGLAN) 10 MG tablet Take 1 tablet (10 mg total) by mouth every 8 (eight) hours as needed for up to 3 days for nausea. 10 tablet 0  . ONETOUCH VERIO test strip USE AS DIRECTED 5 TIMES DAILY 200 strip 2  . promethazine  (PHENERGAN) 25 MG tablet Take 1 tablet (25 mg total) by mouth every 6 (six) hours as needed for nausea or vomiting. 15 tablet 0  . furosemide (LASIX) 20 MG tablet Take 2 tablets (40 mg total) by mouth daily as needed for edema. (Patient taking differently: Take 20 mg by mouth daily as needed for edema. ) 30 tablet 1  . ondansetron (ZOFRAN-ODT) 8 MG disintegrating tablet Take 1 tablet (8 mg total) by mouth every 8 (eight) hours as needed for nausea or vomiting. 20 tablet 0  . potassium chloride (KLOR-CON) 10 MEQ tablet Take 2 tablets (20 mEq total) by mouth daily for 2 days. 4 tablet 0   No facility-administered medications prior to visit.   Past Medical History:  Diagnosis Date  . Asthma    as a child, no problems as an adult, no inhaler  . Cataract    NS OU  . Chronic hypertension during pregnancy, antepartum 08/19/2017  . Dehydration 01/28/2018  . Depression during pregnancy, antepartum 07/07/2017   6/20: Short trial of zoloft previously, reports didn't help much but also didn't give it a chance Discussed r/b/a SSRIs in pregnancy, agrees to try Zoloft again, rx sent No SI/HI/red flags  . Diabetes ([place])    TYPE I  . 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  Marland Kitchen HTN (hypertension)   . Hypertensive retinopathy    OU  . Hypokalemia 01/22/2018  . Hypomagnesemia 01/28/2018  . Intractable nausea and vomiting 01/22/2018  . Intrauterine growth restriction (IUGR) affecting care of mother 12/22/2017  . Morbid obesity ([place])   . 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 Severe hyperemesis gravidarum 10/30/2017  . Type I diabetes mellitus ([place]) 07/07/2017   Current Diabetic Medications:  Insulin  [x]  Aspirin 81 mg daily after 12 weeks (? A2/B GDM)  Required Referrals for A1GDM or A2GDM: [x]  Diabetes Education and Testing Supplies [x]   Nutrition Cousult  For A2/B GDM or higher classes of DM [x]  Diabetes Education and Testing Supplies [x]  Nutrition Counsult [x]  Fetal ECHO after 20 weeks  [x]  Eye exam for retina evaluation - severe retinopathy 7/19  Base  . Ventricular septal defect (VSD) of fetus in singleton pregnancy, antepartum 09/30/2017   May go to newborn nursery per Dr. Lenard Simmer Echo prior to discharge   Past Surgical History:  Procedure Laterality Date  . [place] VITRECTOMY WITH 20 GAUGE MVR PORT FOR MACULAR HOLE Left 07/20/2018   Procedure: 25 GAUGE PARS PLANA VITRECTOMY LEFT EYE;  Surgeon: Bernarda Caffey, MD;  Location: [place];  Service: Ophthalmology;  Laterality: Left;  . CESAREAN SECTION N/A 12/26/2017   Procedure: CESAREAN SECTION;  Surgeon: Osborne Oman, MD;  Location: [place];  Service: Obstetrics;  Laterality: N/A;  . EYE EXAMINATION UNDER ANESTHESIA Right 07/20/2018   Procedure: Eye Exam Under Anesthesia RIGHT EYE;  Surgeon: Bernarda Caffey, MD;  Location: [place];  Service: Ophthalmology;  Laterality: Right;  . EYE SURGERY Left 07/2018  . GAS INSERTION Left 07/19/2019   Procedure: INSERTION OF GAS;  Surgeon: Bernarda Caffey, MD;  Location: [place];  Service: Ophthalmology;  Laterality: Left;  SF6  . INJECTION OF SILICONE OIL Left 01/25/8414   Procedure: Injection Of Silicone Oil LEFT EYE;  Surgeon: Bernarda Caffey, MD;  Location: [place];  Service: Ophthalmology;  Laterality: Left;  . LASER PHOTO ABLATION Right 07/20/2018   Procedure: Laser Photo Ablation RIGHT EYE;  Surgeon: Bernarda Caffey, MD;  Location: [place];  Service: Ophthalmology;  Laterality: Right;  . MEMBRANE PEEL Left 07/20/2018   Procedure: Membrane Peel LEFT EYE;  Surgeon: Bernarda Caffey, MD;  Location: [place];  Service: Ophthalmology;  Laterality: Left;  . MEMBRANE PEEL Left 07/19/2019   Procedure: MEMBRANE PEEL;  Surgeon: Bernarda Caffey, MD;  Location: [place];  Service: Ophthalmology;  Laterality: Left;  . MITOMYCIN C APPLICATION Bilateral 6/0/6301    Procedure: Avastin Application;  Surgeon: Bernarda Caffey, MD;  Location: [place];  Service: Ophthalmology;  Laterality: Bilateral;  . PHOTOCOAGULATION WITH LASER Left 07/20/2018   Procedure: Photocoagulation With Laser LEFT EYE;  Surgeon: Bernarda Caffey, MD;  Location: [place];  Service: Ophthalmology;  Laterality: Left;  . PHOTOCOAGULATION WITH LASER Left 07/19/2019   Procedure: PHOTOCOAGULATION WITH LASER;  Surgeon: Bernarda Caffey, MD;  Location: [place];  Service: Ophthalmology;  Laterality: Left;  . RETINAL DETACHMENT SURGERY Left 07/20/2018   Dr. Bernarda Caffey  . SILICON OIL REMOVAL Left 07/19/2019   Procedure: 25g PARS PLANA VITRECTOMY WITH SILICON OIL REMOVAL;  Surgeon: Bernarda Caffey, MD;  Location: [place];  Service: Ophthalmology;  Laterality: Left;  . WISDOM TOOTH EXTRACTION     Social History   Socioeconomic History  . Marital status: Significant Other  Spouse name: Not on file  . Number of children: Not on file  . Years of education: Not on file  . Highest education level: Not on file  Occupational History  . Not on file  Tobacco Use  . Smoking status: Never Smoker  . Smokeless tobacco: Never Used  Vaping Use  . Vaping Use: Never used  Substance and Sexual Activity  . Alcohol use: Not Currently    Comment: SOCIALLY  . Drug use: Never  . Sexual activity: Not Currently    Birth control/protection: Pill  Other Topics Concern  . Not on file  Social History Narrative  . Not on file   Social Determinants of Health   Financial Resource Strain:   . Difficulty of Paying Living Expenses: Not on file  Food Insecurity: No Food Insecurity  . Worried About Charity fundraiser in the Last Year: Never true  . Ran Out of Food in the Last Year: Never true  Transportation Needs: No Transportation Needs  . Lack of Transportation (Medical): No  . Lack of Transportation (Non-Medical): No  Physical Activity: Inactive  . Days of Exercise per Week: 0 days  . Minutes of Exercise per Session: 0 min   Stress: No Stress Concern Present  . Feeling of Stress : Not at all  Social Connections:   . Frequency of Communication with Friends and Family: Not on file  . Frequency of Social Gatherings with Friends and Family: Not on file  . Attends Religious Services: Not on file  . Active Member of Clubs or Organizations: Not on file  . Attends Archivist Meetings: Not on file  . Marital Status: Not on file   Family History  Problem Relation Age of Onset  . Diabetes Mother   . Aneurysm Mother 52  . Seizures Mother   . Diabetes Father   . Cataracts Father   . COPD Father   . Heart attack Father   . Heart disease Father   . Healthy Sister   . Healthy Daughter   . Stroke Maternal Grandfather   . Amblyopia Neg Hx   . Blindness Neg Hx   . Glaucoma Neg Hx   . Macular degeneration Neg Hx   . Retinal detachment Neg Hx   . Strabismus Neg Hx   . Retinitis pigmentosa Neg Hx   . Colon cancer Neg Hx   . Stomach cancer Neg Hx   . Esophageal cancer Neg Hx   . Pancreatic cancer Neg Hx   . Liver disease Neg Hx       Review of Systems: Pertinent positive and negative review of systems were noted in the above HPI section. All other review of systems were otherwise negative.   Physical Exam: General: Well developed, well nourished, no acute distress Head: Normocephalic and atraumatic Eyes:  sclerae anicteric, EOMI Ears: Normal auditory acuity Mouth: Not examined, mask on during Covid-19 pandemic Neck: Supple, no masses or thyromegaly Lungs: Clear throughout to auscultation Heart: Regular rate and rhythm; no murmurs, rubs or bruits Abdomen: Soft, non tender and non distended. No masses, hepatosplenomegaly or hernias noted. Normal Bowel sounds Rectal: Not done  Musculoskeletal: Symmetrical with no gross deformities  Skin: No lesions on visible extremities Pulses:  Normal pulses noted Extremities: No clubbing, cyanosis, edema or deformities noted Neurological: Alert oriented x 4,  grossly nonfocal Cervical Nodes:  No significant cervical adenopathy Inguinal Nodes: No significant inguinal adenopathy Psychological:  Alert and cooperative. Normal mood and affect   Assessment  and Recommendations:  1. Recurrent nausea, vomiting, occasional LUQ pain, weight loss.  Rule out ulcer, GERD, esophagitis, gastritis, gastroparesis, cholelithiasis and other causes.  Schedule Abd [name] and EGD. Refill Phenergan 25 mg p.o. every 6 hours as needed nausea and vomiting.  If she requires this medication after her GI evaluation is completed will defer to her PCP resume prescribing as indicated. The risks (including bleeding, perforation, infection, missed lesions, medication reactions and possible hospitalization or surgery if complications occur), benefits, and alternatives to endoscopy with possible biopsy and possible dilation were discussed with the patient and they consent to proceed.   2. Constipation.  Recently improved when having apple juice on a daily basis.   cc: Fanny Bien, [address] [place],  [place] [id number]

## 2019-11-28 NOTE — Patient Instructions (Signed)
We have sent the following medications to your pharmacy for you to pick up at your convenience:phenergan.   You have been scheduled for an abdominal ultrasound at [facility] (1st floor of hospital) on 12/07/19 at 10:30am. Please arrive 15 minutes prior to your appointment for registration. Make certain not to have anything to eat or drink 6 hours prior to your appointment. Should you need to reschedule your appointment, please contact radiology at [phone]. This test typically takes about 30 minutes to perform.  You have been scheduled for an endoscopy. Please follow written instructions given to you at your visit today. If you use inhalers (even only as needed), please bring them with you on the day of your procedure.  Due to recent changes in healthcare laws, you may see the results of your imaging and laboratory studies on MyChart before your provider has had a chance to review them.  We understand that in some cases there may be results that are confusing or concerning to you. Not all laboratory results come back in the same time frame and the provider may be waiting for multiple results in order to interpret others.  Please give [name] 48 hours in order for your provider to thoroughly review all the results before contacting the office for clarification of your results.   Normal BMI (Body Mass Index- based on height and weight) is between 19 and 25. Your BMI today is Body mass index is 28.89 kg/m. Marland Kitchen Please consider follow up  regarding your BMI with your Primary Care Provider.  Thank you for choosing me and [facility].  Pricilla Riffle. Dagoberto Ligas., MD., [place]al

## 2019-12-04 ENCOUNTER — Telehealth: Payer: Self-pay | Admitting: Endocrinology

## 2019-12-04 NOTE — Telephone Encounter (Signed)
Patient called re: [facility] told patient they have sent several faxes requesting information in order for Patient to receive her pump supplies (which she is almost out of). Patient states [facility] needs a verbal consent and a detailed Retent Order in order for [facility] to send patient the above listed supplies

## 2019-12-05 ENCOUNTER — Telehealth: Payer: Self-pay | Admitting: Endocrinology

## 2019-12-05 ENCOUNTER — Telehealth: Payer: Self-pay

## 2019-12-05 NOTE — Telephone Encounter (Signed)
New message   Patient made upcoming appt on 12.3.21 need a prescription called in   insulin lispro (HUMALOG) 100 UNIT/ML injection  Insulin Pen Needle (PEN NEEDLES) 32G X 5 MM MISC  ONETOUCH VERIO test strip

## 2019-12-05 NOTE — Telephone Encounter (Signed)
I do not have the paper. please refill x 1, pending her appt.

## 2019-12-05 NOTE — Telephone Encounter (Signed)
Do you have this paper ?

## 2019-12-06 ENCOUNTER — Other Ambulatory Visit: Payer: Self-pay | Admitting: *Deleted

## 2019-12-06 DIAGNOSIS — E109 Type 1 diabetes mellitus without complications: Secondary | ICD-10-CM

## 2019-12-06 MED ORDER — PEN NEEDLES 32G X 5 MM MISC: 1.0000 | Freq: Four times a day (QID) | 11 refills | Status: DC

## 2019-12-06 MED ORDER — HYDRALAZINE HCL 50 MG PO TABS: ORAL_TABLET | ORAL | 1 refills | Status: DC

## 2019-12-06 MED ORDER — ONETOUCH VERIO VI STRP: ORAL_STRIP | 2 refills | Status: DC

## 2019-12-06 NOTE — Telephone Encounter (Signed)
Pt set up for an appt with Dr. Loanne Drilling and faxed complete documentation to [phone] fax

## 2019-12-06 NOTE — Telephone Encounter (Signed)
Refilled medication to [facility]-

## 2019-12-07 ENCOUNTER — Ambulatory Visit (HOSPITAL_COMMUNITY)
Admission: RE | Admit: 2019-12-07 | Discharge: 2019-12-07 | Disposition: A | Discharge status: Home / Self Care | Payer: 59 | Source: Ambulatory Visit | Attending: Gastroenterology | Admitting: Gastroenterology

## 2019-12-07 ENCOUNTER — Other Ambulatory Visit: Payer: Self-pay

## 2019-12-07 DIAGNOSIS — R1012 Left upper quadrant pain: Secondary | ICD-10-CM | POA: Insufficient documentation

## 2019-12-07 DIAGNOSIS — R634 Abnormal weight loss: Secondary | ICD-10-CM | POA: Insufficient documentation

## 2019-12-07 DIAGNOSIS — R112 Nausea with vomiting, unspecified: Secondary | ICD-10-CM | POA: Insufficient documentation

## 2019-12-07 IMAGING — US US ABDOMEN LIMITED
1 series · 14 of 25 positions shown · non-contrast
Comparison: Radiograph [DATE]

CLINICAL DATA: Nausea vomiting

EXAM:
ULTRASOUND ABDOMEN LIMITED RIGHT UPPER QUADRANT

[Series 1: us abdomen limited · 14 of 29 slices shown]
[im 1/29]
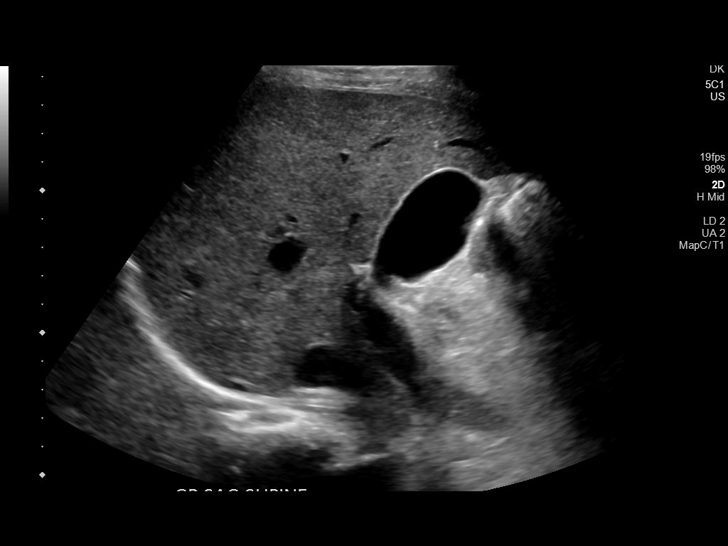
[im 3/29]
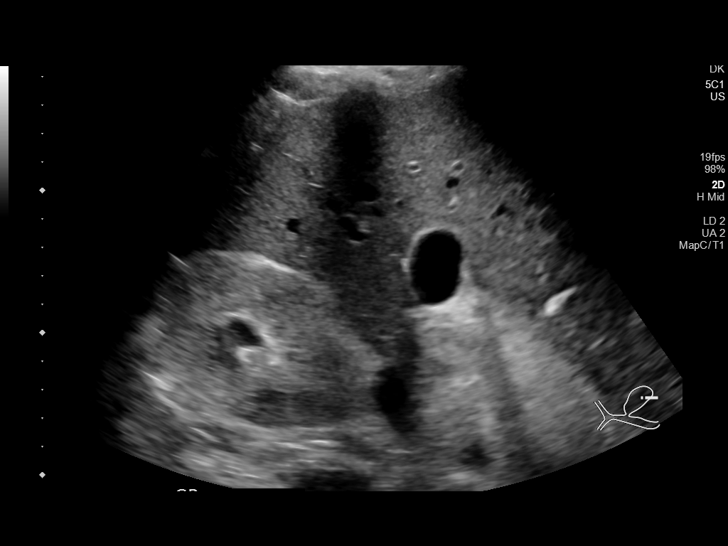
[im 5/29]
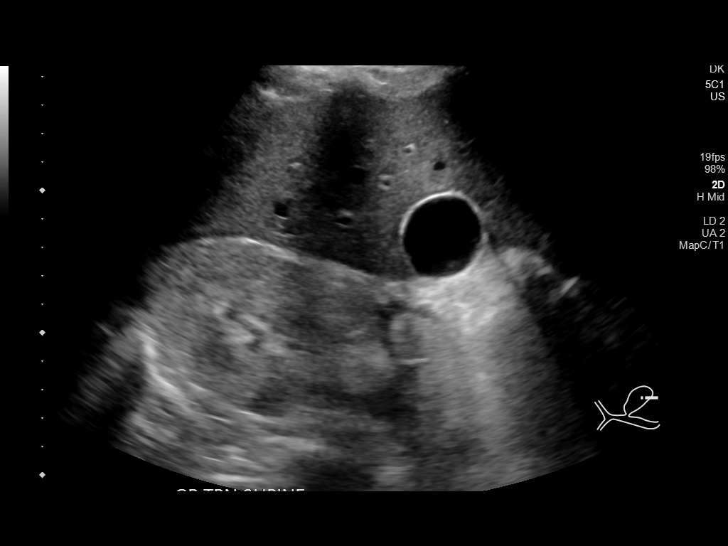
[im 8/29]
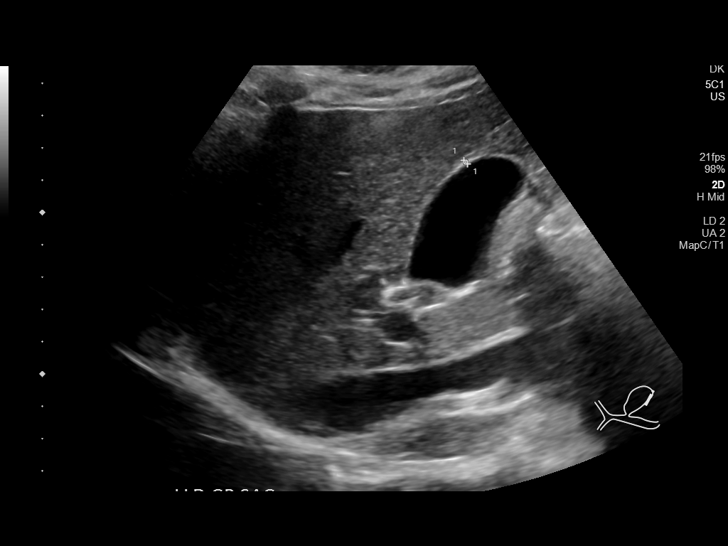
[im 10/29]
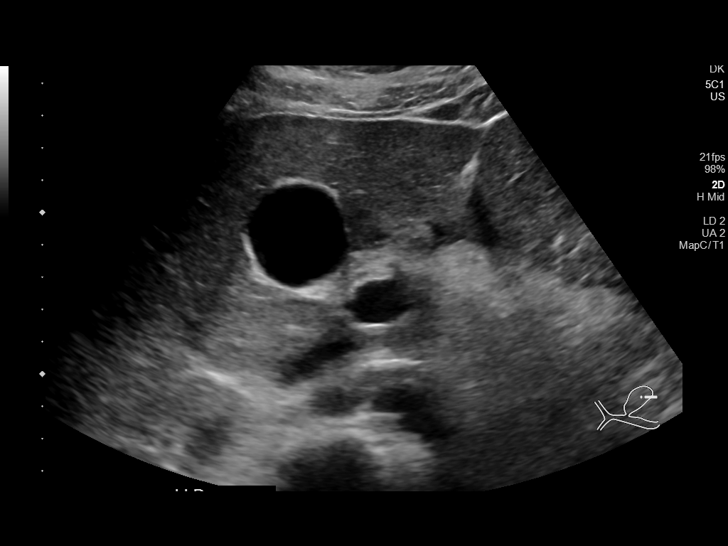
[im 11/29]
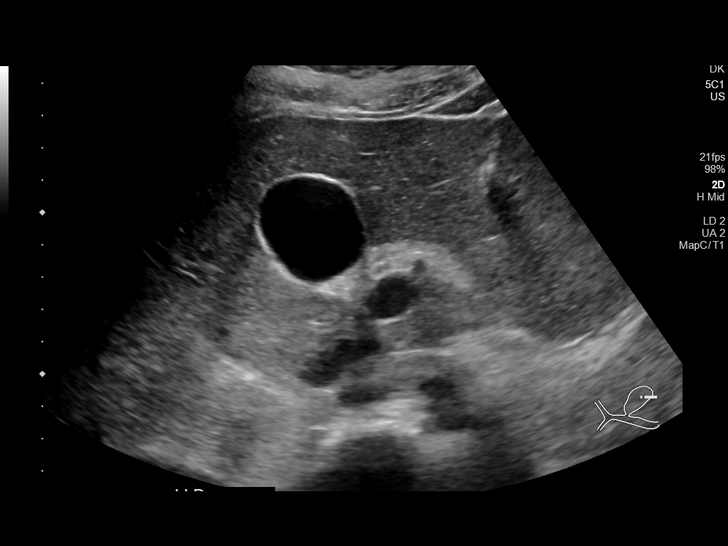
[im 13/29]
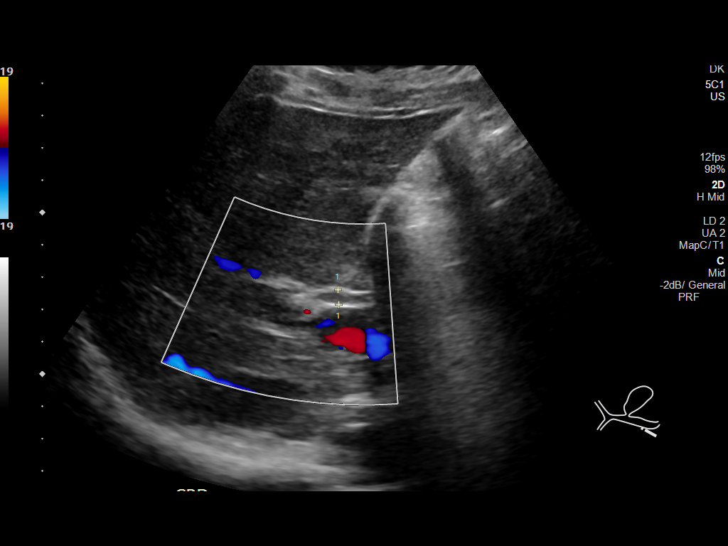
[im 16/29]
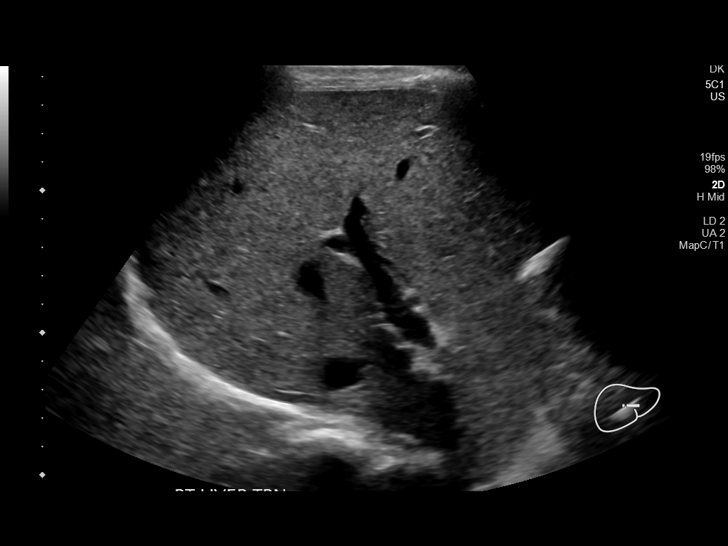
[im 18/29]
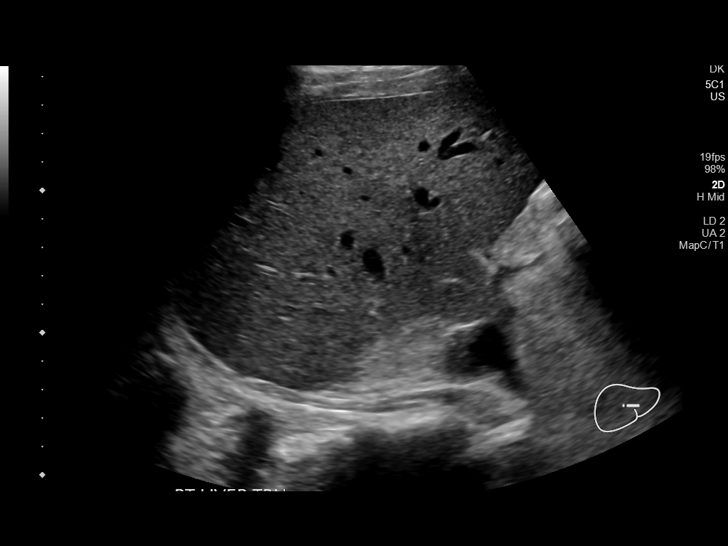
[im 19/29]
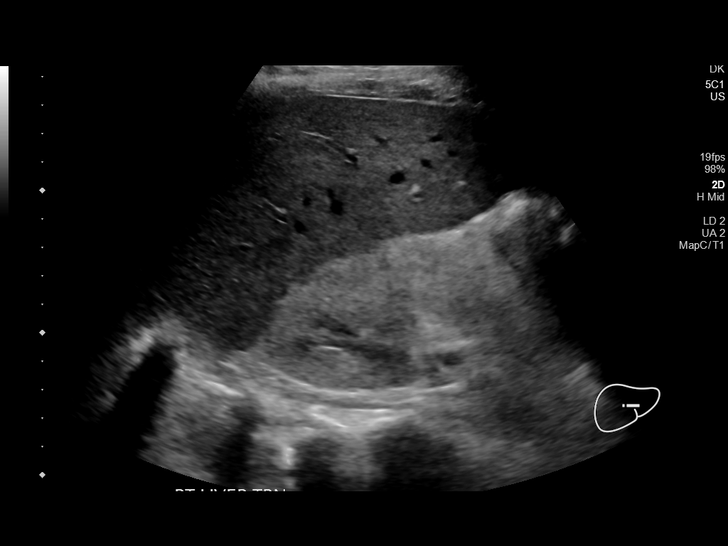
[im 22/29]
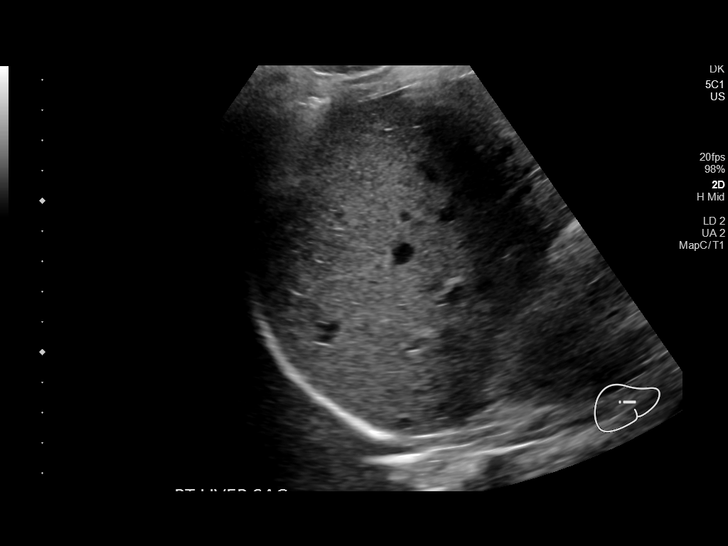
[im 24/29]
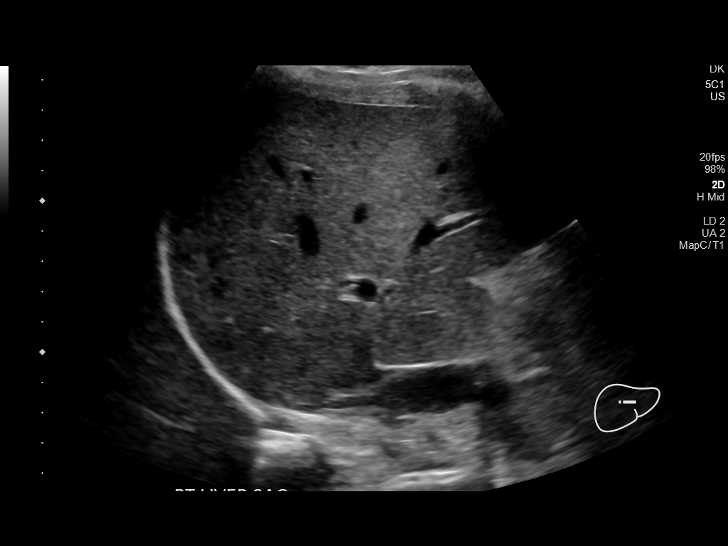
[im 26/29]
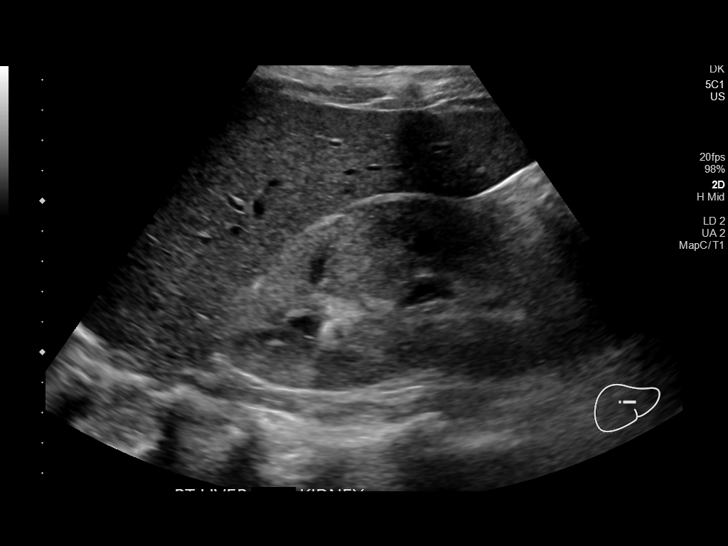
[im 29/29]
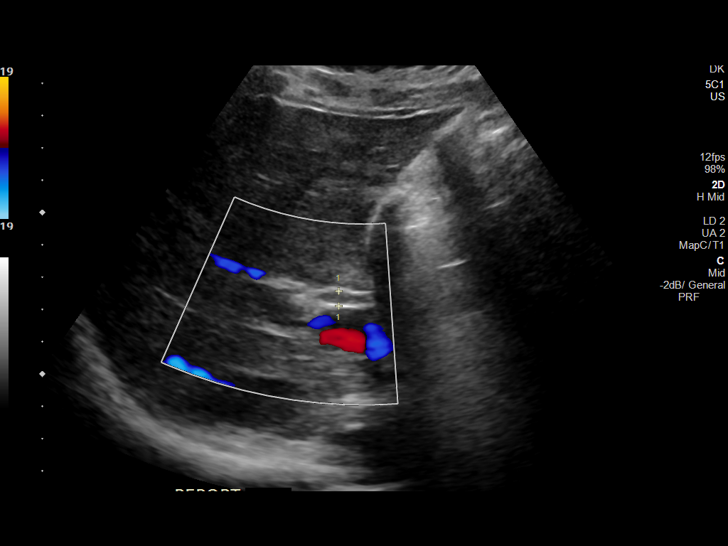

[14 of 25 positions shown; findings below may reference images not displayed]

FINDINGS: Gallbladder:

No gallstones or wall thickening visualized. No sonographic Murphy
sign noted by sonographer.

Common bile duct:

Diameter: 4.6 mm

Liver:

No focal lesion identified. Within normal limits in parenchymal
echogenicity. Portal vein is patent on color Doppler imaging with
normal direction of blood flow towards the liver.

Other: Right kidney is diffusely echogenic.
IMPRESSION: 1. Negative right upper quadrant abdominal ultrasound
2. Echogenic right kidney suggesting medical renal disease. Consider
dedicated renal ultrasound and recommend correlation with
appropriate laboratory studies.

## 2019-12-21 ENCOUNTER — Ambulatory Visit (INDEPENDENT_AMBULATORY_CARE_PROVIDER_SITE_OTHER): Payer: 59 | Admitting: Endocrinology

## 2019-12-21 ENCOUNTER — Other Ambulatory Visit: Payer: Self-pay

## 2019-12-21 ENCOUNTER — Encounter: Payer: Self-pay | Admitting: Endocrinology

## 2019-12-21 VITALS — BP 224/112 | HR 95 | Ht 66.0 in | Wt 171.0 lb

## 2019-12-21 DIAGNOSIS — I10 Essential (primary) hypertension: Secondary | ICD-10-CM

## 2019-12-21 DIAGNOSIS — E10319 Type 1 diabetes mellitus with unspecified diabetic retinopathy without macular edema: Secondary | ICD-10-CM

## 2019-12-21 LAB — T4, FREE: Free T4: 0.83 ng/dL (ref 0.60–1.60)

## 2019-12-21 LAB — BASIC METABOLIC PANEL
BUN: 28 mg/dL — ABNORMAL HIGH (ref 6–23)
CO2: 21 mEq/L (ref 19–32)
Calcium: 8.9 mg/dL (ref 8.4–10.5)
Chloride: 103 mEq/L (ref 96–112)
Creatinine, Ser: 3.73 mg/dL — ABNORMAL HIGH (ref 0.40–1.20)
GFR: 15.72 mL/min — ABNORMAL LOW (ref >60.00)
Glucose, Bld: 154 mg/dL — ABNORMAL HIGH (ref 70–99)
Potassium: 3.6 mEq/L (ref 3.5–5.1)
Sodium: 136 mEq/L (ref 135–145)

## 2019-12-21 LAB — POCT GLYCOSYLATED HEMOGLOBIN (HGB A1C): Hemoglobin A1C: 6.6 % — AB (ref 4.0–5.6)

## 2019-12-21 LAB — TSH: TSH: 2.42 u[IU]/mL (ref 0.35–4.50)

## 2019-12-21 NOTE — Patient Instructions (Addendum)
please take these pump settings: basal rate of 2.3 unit/hr, 7 AM-9 PM, and 0.8 units/hr overnight.   No meal bolus.  correction bolus (which some people call "sensitivity," or "insulin sensitivity ratio," or just "isr") of 1 unit for each 30 by which your glucose exceeds 100.   On this type of pump schedule, you should eat meals on a regular schedule.  If a meal is missed or significantly delayed, your blood sugar could go low.   check your blood sugar 5 times a day: before the 3 meals, and at bedtime.  also check if you have symptoms of your blood sugar being too high or too low.  please keep a record of the readings and bring it to your next appointment here (or you can bring the meter itself).  You can write it on any piece of paper.  please call us sooner if your blood sugar goes below 70, or if you have a lot of readings over 200.   Your blood pressure is high today.  Please advise your primary care provider today, that it is high.  Please see her soon to have it rechecked.   Please come back for a follow-up appointment in 3 months.

## 2019-12-21 NOTE — Progress Notes (Signed)
Subjective:    Patient ID: Barbara Donovan, female    DOB: March 01, 1990, 29 y.o.   MRN: [id number]  HPI Pt returns for f/u of diabetes mellitus: DM type: 1 Dx'ed: 2951 Complications: PDR and stage 4 CRI.   Therapy: insulin since 2009, and pump rx since 2020.  DKA: last episode was 2015.  Severe hypoglycemia: never Pancreatitis: never Pancreatic imaging: never.  Other: she takes T-slim pump, but not continuous glucose monitor, due to cost.  Interval history:  She takes these settings:  basal rate of 2.4 unit/hr, 7 AM-9 PM, and 0.8 units/hr overnight.   No meal bolus.  correction bolus (which some people call "sensitivity," or "insulin sensitivity ratio," or just "isr") of 1 unit for each 30 by which your glucose exceeds 100. TDD is 45 units (99% basal) she brings her meter with her cbg's which I have reviewed today.  cbg varies from 64-284.  It is in general lowest in the afternoon.  She has mild hypoglycemia approx once per week.  This usually happens in the afternoon Past Medical History:  Diagnosis Date  . Asthma    as a child, no problems as an adult, no inhaler  . Cataract    NS OU  . Chronic hypertension during pregnancy, antepartum 08/19/2017  . Dehydration 01/28/2018  . Depression during pregnancy, antepartum 07/07/2017   6/20: Short trial of zoloft previously, reports didn't help much but also didn't give it a chance Discussed r/b/a SSRIs in pregnancy, agrees to try Zoloft again, rx sent No SI/HI/red flags  . Diabetes ([place])    TYPE I  . 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  Marland Kitchen HTN (hypertension)   . Hypertensive retinopathy    OU  . Hypokalemia 01/22/2018  . Hypomagnesemia 01/28/2018  . Intractable nausea and vomiting 01/22/2018  . Intrauterine growth restriction (IUGR) affecting care of mother 12/22/2017  . Morbid obesity ([place])   . 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 Proteinuria  due to type 1 diabetes mellitus ([place]) 11/02/2017   Baseline Pr: Cr 10.23  . Severe hyperemesis gravidarum 10/30/2017  . Type I diabetes mellitus ([place]) 07/07/2017   Current Diabetic Medications:  Insulin  [x]  Aspirin 81 mg daily after 12 weeks (? A2/B GDM)  Required Referrals for A1GDM or A2GDM: [x]  Diabetes Education and Testing Supplies [x]  Nutrition Cousult  For A2/B GDM or higher classes of DM [x]  Diabetes Education and Testing Supplies [x]  Nutrition Counsult [x]  Fetal ECHO after 20 weeks  [x]  Eye exam for retina evaluation - severe retinopathy 7/19  Base  . Ventricular septal defect (VSD) of fetus in singleton pregnancy, antepartum 09/30/2017   May go to newborn nursery per Dr. Lenard Simmer Echo prior to discharge    Past Surgical History:  Procedure Laterality Date  . [place] VITRECTOMY WITH 20 GAUGE MVR PORT FOR MACULAR HOLE Left 07/20/2018   Procedure: 25 GAUGE PARS PLANA VITRECTOMY LEFT EYE;  Surgeon: Bernarda Caffey, MD;  Location: [facility];  Service: Ophthalmology;  Laterality: Left;  . CESAREAN SECTION N/A 12/26/2017   Procedure: CESAREAN SECTION;  Surgeon: Osborne Oman, MD;  Location: [facility];  Service: Obstetrics;  Laterality: N/A;  . EYE EXAMINATION UNDER ANESTHESIA Right 07/20/2018   Procedure: Eye Exam Under Anesthesia RIGHT EYE;  Surgeon: Bernarda Caffey, MD;  Location: [place];  Service: Ophthalmology;  Laterality: Right;  . EYE SURGERY Left 07/2018  . GAS INSERTION Left 07/19/2019  Procedure: INSERTION OF GAS;  Surgeon: Bernarda Caffey, MD;  Location: [facility];  Service: Ophthalmology;  Laterality: Left;  SF6  . INJECTION OF SILICONE OIL Left 06/19/9507   Procedure: Injection Of Silicone Oil LEFT EYE;  Surgeon: Bernarda Caffey, MD;  Location: [place];  Service: Ophthalmology;  Laterality: Left;  . LASER PHOTO ABLATION Right 07/20/2018   Procedure: Laser Photo Ablation RIGHT EYE;  Surgeon: Bernarda Caffey, MD;  Location: [facility];  Service: Ophthalmology;  Laterality: Right;  . MEMBRANE  PEEL Left 07/20/2018   Procedure: Membrane Peel LEFT EYE;  Surgeon: Bernarda Caffey, MD;  Location: [facility];  Service: Ophthalmology;  Laterality: Left;  . MEMBRANE PEEL Left 07/19/2019   Procedure: MEMBRANE PEEL;  Surgeon: Bernarda Caffey, MD;  Location: [facility];  Service: Ophthalmology;  Laterality: Left;  . MITOMYCIN C APPLICATION Bilateral 03/20/6710   Procedure: Avastin Application;  Surgeon: Bernarda Caffey, MD;  Location: [place] [facility];  Service: Ophthalmology;  Laterality: Bilateral;  . PHOTOCOAGULATION WITH LASER Left 07/20/2018   Procedure: Photocoagulation With Laser LEFT EYE;  Surgeon: Bernarda Caffey, MD;  Location: [place] [facility];  Service: Ophthalmology;  Laterality: Left;  . PHOTOCOAGULATION WITH LASER Left 07/19/2019   Procedure: PHOTOCOAGULATION WITH LASER;  Surgeon: Bernarda Caffey, MD;  Location: [facility];  Service: Ophthalmology;  Laterality: Left;  . RETINAL DETACHMENT SURGERY Left 07/20/2018   Dr. Bernarda Caffey  . SILICON OIL REMOVAL Left 07/19/2019   Procedure: 25g PARS PLANA VITRECTOMY WITH SILICON OIL REMOVAL;  Surgeon: Bernarda Caffey, MD;  Location: [place];  Service: Ophthalmology;  Laterality: Left;  . WISDOM TOOTH EXTRACTION      Social History   Socioeconomic History  . Marital status: Significant Other    Spouse name: Not on file  . Number of children: Not on file  . Years of education: Not on file  . Highest education level: Not on file  Occupational History  . Not on file  Tobacco Use  . Smoking status: Never Smoker  . Smokeless tobacco: Never Used  Vaping Use  . Vaping Use: Never used  Substance and Sexual Activity  . Alcohol use: Not Currently    Comment: SOCIALLY  . Drug use: Never  . Sexual activity: Not Currently    Birth control/protection: Pill  Other Topics Concern  . Not on file  Social History Narrative  . Not on file   Social Determinants of Health   Financial Resource Strain:   . Difficulty of Paying Living Expenses: Not on file  Food Insecurity: No Food Insecurity  .  Worried About Charity fundraiser in the Last Year: Never true  . Ran Out of Food in the Last Year: Never true  Transportation Needs: No Transportation Needs  . Lack of Transportation (Medical): No  . Lack of Transportation (Non-Medical): No  Physical Activity: Inactive  . Days of Exercise per Week: 0 days  . Minutes of Exercise per Session: 0 min  Stress: No Stress Concern Present  . Feeling of Stress : Not at all  Social Connections:   . Frequency of Communication with Friends and Family: Not on file  . Frequency of Social Gatherings with Friends and Family: Not on file  . Attends Religious Services: Not on file  . Active Member of Clubs or Organizations: Not on file  . Attends Archivist Meetings: Not on file  . Marital Status: Not on file  Intimate Partner Violence:   . Fear of Current or Ex-Partner: Not on file  . Emotionally  Abused: Not on file  . Physically Abused: Not on file  . Sexually Abused: Not on file    Current Outpatient Medications on File Prior to Visit  Medication Sig Dispense Refill  . Blood Pressure Monitoring (OMRON 3 SERIES BP MONITOR) DEVI 1 each by Does not apply route daily. Take daily blood pressure. Please report back to Dr. Radford Pax in 2 wks. 1 Device 0  . carvedilol (COREG) 25 MG tablet Take 1 tablet (25 mg total) by mouth 2 (two) times daily. 180 tablet 3  . diltiazem (CARDIZEM CD) 240 MG 24 hr capsule Take 1 capsule (240 mg total) by mouth daily. 30 capsule 6  . glucose blood (ONETOUCH VERIO) test strip USE AS DIRECTED 5 TIMES DAILY 200 strip 2  . hydrALAZINE (APRESOLINE) 50 MG tablet Take 50 mg by mouth three times daily 180 tablet 1  . insulin lispro (HUMALOG) 100 UNIT/ML injection Inject 0.8 mLs (80 Units total) into the skin daily. FOR USE IN PUMP, TOTAL OF 80 UNITS PER DAY. 30 mL 1  . Insulin Pen Needle (PEN NEEDLES) 32G X 5 MM MISC 1 Device by Does not apply route 4 (four) times daily. 120 each 11  . irbesartan (AVAPRO) 300 MG tablet Take  300 mg by mouth daily.    . promethazine (PHENERGAN) 25 MG tablet Take 1 tablet (25 mg total) by mouth every 6 (six) hours as needed for nausea or vomiting. 30 tablet 2  . metoCLOPramide (REGLAN) 10 MG tablet Take 1 tablet (10 mg total) by mouth every 8 (eight) hours as needed for up to 3 days for nausea. 10 tablet 0   No current facility-administered medications on file prior to visit.    No Known Allergies  Family History  Problem Relation Age of Onset  . Diabetes Mother   . Aneurysm Mother 57  . Seizures Mother   . Diabetes Father   . Cataracts Father   . COPD Father   . Heart attack Father   . Heart disease Father   . Healthy Sister   . Healthy Daughter   . Stroke Maternal Grandfather   . Amblyopia Neg Hx   . Blindness Neg Hx   . Glaucoma Neg Hx   . Macular degeneration Neg Hx   . Retinal detachment Neg Hx   . Strabismus Neg Hx   . Retinitis pigmentosa Neg Hx   . Colon cancer Neg Hx   . Stomach cancer Neg Hx   . Esophageal cancer Neg Hx   . Pancreatic cancer Neg Hx   . Liver disease Neg Hx     BP (!) 224/112 (BP Location: Left Arm, Patient Position: Sitting, Cuff Size: Normal)   Pulse 95   Ht 5\' 6"  (1.676 m)   Wt 171 lb (77.6 kg)   SpO2 99%   BMI 27.60 kg/m    Review of Systems Denies LOC.  Pt says she vomited her BP meds this AM.  She has anxiety.     Objective:   Physical Exam VITAL SIGNS:  See vs page GENERAL: no distress Pulses: dorsalis pedis intact bilat.   MSK: no deformity of the feet CV: 2+ bilat leg edema Skin:  no ulcer on the feet.  normal color and temp on the feet. Neuro: sensation is intact to touch on the feet  A1c=6.6%      Assessment & Plan:  Type 1 DM, with stage 4 CRI Hypoglycemia, due to insulin: this limits aggressiveness of glycemic control.  HTN: she  is apparently missing meds due to n/v.   Patient Instructions  please take these pump settings: basal rate of 2.3 unit/hr, 7 AM-9 PM, and 0.8 units/hr overnight.   No meal  bolus.  correction bolus (which some people call "sensitivity," or "insulin sensitivity ratio," or just "isr") of 1 unit for each 30 by which your glucose exceeds 100.   On this type of pump schedule, you should eat meals on a regular schedule.  If a meal is missed or significantly delayed, your blood sugar could go low.   check your blood sugar 5 times a day: before the 3 meals, and at bedtime.  also check if you have symptoms of your blood sugar being too high or too low.  please keep a record of the readings and bring it to your next appointment here (or you can bring the meter itself).  You can write it on any piece of paper.  please call us sooner if your blood sugar goes below 70, or if you have a lot of readings over 200.   Your blood pressure is high today.  Please advise your primary care provider today, that it is high.  Please see her soon to have it rechecked.   Please come back for a follow-up appointment in 3 months.

## 2019-12-25 ENCOUNTER — Telehealth: Payer: Self-pay | Admitting: Endocrinology

## 2019-12-25 NOTE — Telephone Encounter (Signed)
-----   Message from Renato Shin, MD sent at 12/21/2019  1:11 PM EST ----- Pt says [employer] sent [name] a form for pump supplies. Do we have it anywhere?

## 2019-12-25 NOTE — Telephone Encounter (Signed)
If we received a fax at the front fax [phone] we would have put it in your CMA box up front for pick up.  If it came to the fax machine near you it would have been picked up by the folks working back there.

## 2019-12-25 NOTE — Telephone Encounter (Signed)
It may be easier to call [employer] at [phone]

## 2019-12-25 NOTE — Telephone Encounter (Signed)
We will be on the lookout for the paperwork, and fill it out.  Thanks!

## 2019-12-27 ENCOUNTER — Other Ambulatory Visit: Payer: Self-pay

## 2019-12-27 ENCOUNTER — Emergency Department (HOSPITAL_COMMUNITY)
Admission: EM | Admit: 2019-12-27 | Discharge: 2019-12-27 | Disposition: A | Discharge status: Home / Self Care | Payer: 59 | Attending: Emergency Medicine | Admitting: Emergency Medicine

## 2019-12-27 ENCOUNTER — Encounter (HOSPITAL_COMMUNITY): Payer: Self-pay

## 2019-12-27 DIAGNOSIS — E1022 Type 1 diabetes mellitus with diabetic chronic kidney disease: Secondary | ICD-10-CM | POA: Diagnosis not present

## 2019-12-27 DIAGNOSIS — J45909 Unspecified asthma, uncomplicated: Secondary | ICD-10-CM | POA: Diagnosis not present

## 2019-12-27 DIAGNOSIS — Z794 Long term (current) use of insulin: Secondary | ICD-10-CM | POA: Diagnosis not present

## 2019-12-27 DIAGNOSIS — R1013 Epigastric pain: Secondary | ICD-10-CM | POA: Diagnosis not present

## 2019-12-27 DIAGNOSIS — R000 Tachycardia, unspecified: Secondary | ICD-10-CM | POA: Insufficient documentation

## 2019-12-27 DIAGNOSIS — N189 Chronic kidney disease, unspecified: Secondary | ICD-10-CM | POA: Diagnosis not present

## 2019-12-27 DIAGNOSIS — Z79899 Other long term (current) drug therapy: Secondary | ICD-10-CM | POA: Insufficient documentation

## 2019-12-27 DIAGNOSIS — I129 Hypertensive chronic kidney disease with stage 1 through stage 4 chronic kidney disease, or unspecified chronic kidney disease: Secondary | ICD-10-CM | POA: Insufficient documentation

## 2019-12-27 DIAGNOSIS — R1110 Vomiting, unspecified: Secondary | ICD-10-CM | POA: Diagnosis present

## 2019-12-27 DIAGNOSIS — I160 Hypertensive urgency: Secondary | ICD-10-CM | POA: Diagnosis not present

## 2019-12-27 DIAGNOSIS — E109 Type 1 diabetes mellitus without complications: Secondary | ICD-10-CM

## 2019-12-27 DIAGNOSIS — N179 Acute kidney failure, unspecified: Secondary | ICD-10-CM

## 2019-12-27 LAB — COMPREHENSIVE METABOLIC PANEL
ALT: 13 U/L (ref 0–44)
AST: 14 U/L — ABNORMAL LOW (ref 15–41)
Albumin: 2.2 g/dL — ABNORMAL LOW (ref 3.5–5.0)
Alkaline Phosphatase: 78 U/L (ref 38–126)
Anion gap: 10 (ref 5–15)
BUN: 24 mg/dL — ABNORMAL HIGH (ref 6–20)
CO2: 21 mmol/L — ABNORMAL LOW (ref 22–32)
Calcium: 8.8 mg/dL — ABNORMAL LOW (ref 8.9–10.3)
Chloride: 103 mmol/L (ref 98–111)
Creatinine, Ser: 4.05 mg/dL — ABNORMAL HIGH (ref 0.44–1.00)
GFR, Estimated: 15 mL/min — ABNORMAL LOW (ref >60)
Glucose, Bld: 304 mg/dL — ABNORMAL HIGH (ref 70–99)
Potassium: 3.6 mmol/L (ref 3.5–5.1)
Sodium: 134 mmol/L — ABNORMAL LOW (ref 135–145)
Total Bilirubin: 0.7 mg/dL (ref 0.3–1.2)
Total Protein: 6.9 g/dL (ref 6.5–8.1)

## 2019-12-27 LAB — CBC
HCT: 41.6 % (ref 36.0–46.0)
Hemoglobin: 13.5 g/dL (ref 12.0–15.0)
MCH: 26 pg (ref 26.0–34.0)
MCHC: 32.5 g/dL (ref 30.0–36.0)
MCV: 80 fL (ref 80.0–100.0)
Platelets: 608 10*3/uL — ABNORMAL HIGH (ref 150–400)
RBC: 5.2 MIL/uL — ABNORMAL HIGH (ref 3.87–5.11)
RDW: 13.8 % (ref 11.5–15.5)
WBC: 8.1 10*3/uL (ref 4.0–10.5)
nRBC: 0 % (ref 0.0–0.2)

## 2019-12-27 LAB — LIPASE, BLOOD: Lipase: 30 U/L (ref 11–51)

## 2019-12-27 LAB — I-STAT BETA HCG BLOOD, ED (MC, WL, AP ONLY): I-stat hCG, quantitative: <5 m[IU]/mL (ref <5)

## 2019-12-27 LAB — CBG MONITORING, ED: Glucose-Capillary: 287 mg/dL — ABNORMAL HIGH (ref 70–99)

## 2019-12-27 MED ORDER — ONDANSETRON 4 MG PO TBDP: 4.0000 mg | ORAL_TABLET | Freq: Three times a day (TID) | ORAL | 0 refills | Status: DC | PRN

## 2019-12-27 MED ORDER — SODIUM CHLORIDE 0.9 % IV BOLUS: 1000.0000 mL | Freq: Once | INTRAVENOUS | Status: DC

## 2019-12-27 MED ORDER — ONDANSETRON 4 MG PO TBDP
4.0000 mg | ORAL_TABLET | Freq: Once | ORAL | Status: AC | PRN
  Administered 2019-12-27: 4 mg via ORAL
  Filled 2019-12-27: qty 1

## 2019-12-27 MED ORDER — HYDRALAZINE HCL 20 MG/ML IJ SOLN
10.0000 mg | Freq: Once | INTRAMUSCULAR | Status: AC
  Administered 2019-12-27: 10 mg via INTRAVENOUS
  Filled 2019-12-27: qty 1

## 2019-12-27 MED ORDER — SODIUM CHLORIDE 0.9 % IV BOLUS
1000.0000 mL | Freq: Once | INTRAVENOUS | Status: AC
  Administered 2019-12-27: 1000 mL via INTRAVENOUS

## 2019-12-27 MED ORDER — HYDRALAZINE HCL 50 MG PO TABS: 50.0000 mg | ORAL_TABLET | Freq: Three times a day (TID) | ORAL | 0 refills | Status: DC

## 2019-12-27 MED ORDER — METOCLOPRAMIDE HCL 5 MG/ML IJ SOLN
10.0000 mg | Freq: Once | INTRAMUSCULAR | Status: AC
  Administered 2019-12-27: 10 mg via INTRAVENOUS
  Filled 2019-12-27: qty 2

## 2019-12-27 NOTE — ED Triage Notes (Signed)
Patient c/o generalized abdominal pain and emesis x 2 days. patient denies any diarrhea or fever.  Patient's BP in triage 187/127. Patient states she has not taken her BP meds x 2 days.

## 2019-12-27 NOTE — Telephone Encounter (Signed)
Called [employer] and requested paperwork.

## 2019-12-27 NOTE — ED Provider Notes (Signed)
[facility] DEPT Provider Note   CSN: [id number] Arrival date & time: 12/27/19  1504     History Chief Complaint  Patient presents with  . Hypertension    Barbara Donovan is a 29 y.o. female with a past medical history of type 1 diabetes, hypertension, CKD presenting to the ED with a chief complaint of abdominal pain, emesis, hypertension.  For the past 2 days has been unable to take her blood pressure medication secondary to her emesis.  She feels that her insulin pump is running low.  Reports generalized abdominal pain from all the vomiting.  No sick contacts with similar symptoms or suspicious food intake.  Has tried her home Phenergan with minimal improvement in her symptoms.  Denies any diarrhea, chest pain, shortness of breath, fever, urinary symptoms.  HPI     Past Medical History:  Diagnosis Date  . Asthma    as a child, no problems as an adult, no inhaler  . Cataract    NS OU  . Chronic hypertension during pregnancy, antepartum 08/19/2017  . Dehydration 01/28/2018  . Depression during pregnancy, antepartum 07/07/2017   6/20: Short trial of zoloft previously, reports didn't help much but also didn't give it a chance Discussed r/b/a SSRIs in pregnancy, agrees to try Zoloft again, rx sent No SI/HI/red flags  . Diabetes ([place])    TYPE I  . Diabetic retinopathy ([place]) 06/09/2017   07/2017 with bilateral severe diabetic non-proliferative retinopathy with macular edema.  Marland Kitchen HTN (hypertension)   . Hypertensive retinopathy    OU  . Hypokalemia 01/22/2018  . Hypomagnesemia 01/28/2018  . Intractable nausea and vomiting 01/22/2018  . Intrauterine growth restriction (IUGR) affecting care of mother 12/22/2017  . Morbid obesity ([place])   . 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 Proteinuria due to type 1 diabetes mellitus ([place]) 11/02/2017   Baseline Pr: Cr 10.23  . Severe hyperemesis gravidarum 10/30/2017  . Type I diabetes mellitus ([place]) 07/07/2017   Current  Diabetic Medications:  Insulin  [x]  Aspirin 81 mg daily after 12 weeks (? A2/B GDM)  Required Referrals for A1GDM or A2GDM: [x]  Diabetes Education and Testing Supplies [x]  Nutrition Cousult  For A2/B GDM or higher classes of DM [x]  Diabetes Education and Testing Supplies [x]  Nutrition Counsult [x]  Fetal ECHO after 20 weeks  [x]  Eye exam for retina evaluation - severe retinopathy 7/19  Base  . Ventricular septal defect (VSD) of fetus in singleton pregnancy, antepartum 09/30/2017   May go to newborn nursery per Dr. Lenard Simmer Echo prior to discharge    Patient Active Problem List   Diagnosis Date Noted  . DKA (diabetic ketoacidoses) 09/14/2019  . COVID-19 virus infection 09/14/2019  . SOB (shortness of breath) 03/27/2018  . Hypomagnesemia 01/28/2018  . Dehydration 01/28/2018  . Intractable nausea and vomiting 01/22/2018  . Hypokalemia 01/22/2018  . 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 Proteinuria due to type 1 diabetes mellitus ([place]) 11/02/2017  . Chronic hypertension 08/21/2017  . Type I diabetes mellitus ([place]) 07/07/2017  . Diabetic retinopathy ([place]) 06/09/2017  . Acute depression 11/02/2014  . Asthma 10/30/2013    Past Surgical History:  Procedure Laterality Date  . [place] VITRECTOMY WITH 20 GAUGE MVR PORT FOR MACULAR HOLE Left 07/20/2018   Procedure: 25 GAUGE PARS PLANA VITRECTOMY LEFT EYE;  Surgeon: Bernarda Caffey, MD;  Location: [facility];  Service: Ophthalmology;  Laterality: Left;  . CESAREAN SECTION N/A 12/26/2017   Procedure: CESAREAN SECTION;  Surgeon: Verita Schneiders  A, MD;  Location: [facility];  Service: Obstetrics;  Laterality: N/A;  . EYE EXAMINATION UNDER ANESTHESIA Right 07/20/2018   Procedure: Eye Exam Under Anesthesia RIGHT EYE;  Surgeon: Bernarda Caffey, MD;  Location: [facility];  Service: Ophthalmology;  Laterality: Right;  . EYE SURGERY Left 07/2018  . GAS INSERTION Left 07/19/2019   Procedure: INSERTION OF GAS;  Surgeon: Bernarda Caffey, MD;  Location: [facility];   Service: Ophthalmology;  Laterality: Left;  SF6  . INJECTION OF SILICONE OIL Left 01/27/4172   Procedure: Injection Of Silicone Oil LEFT EYE;  Surgeon: Bernarda Caffey, MD;  Location: [facility];  Service: Ophthalmology;  Laterality: Left;  . LASER PHOTO ABLATION Right 07/20/2018   Procedure: Laser Photo Ablation RIGHT EYE;  Surgeon: Bernarda Caffey, MD;  Location: [facility];  Service: Ophthalmology;  Laterality: Right;  . MEMBRANE PEEL Left 07/20/2018   Procedure: Membrane Peel LEFT EYE;  Surgeon: Bernarda Caffey, MD;  Location: [facility];  Service: Ophthalmology;  Laterality: Left;  . MEMBRANE PEEL Left 07/19/2019   Procedure: MEMBRANE PEEL;  Surgeon: Bernarda Caffey, MD;  Location: [facility];  Service: Ophthalmology;  Laterality: Left;  . MITOMYCIN C APPLICATION Bilateral 0/08/1446   Procedure: Avastin Application;  Surgeon: Bernarda Caffey, MD;  Location: [facility];  Service: Ophthalmology;  Laterality: Bilateral;  . PHOTOCOAGULATION WITH LASER Left 07/20/2018   Procedure: Photocoagulation With Laser LEFT EYE;  Surgeon: Bernarda Caffey, MD;  Location: [facility];  Service: Ophthalmology;  Laterality: Left;  . PHOTOCOAGULATION WITH LASER Left 07/19/2019   Procedure: PHOTOCOAGULATION WITH LASER;  Surgeon: Bernarda Caffey, MD;  Location: [facility];  Service: Ophthalmology;  Laterality: Left;  . RETINAL DETACHMENT SURGERY Left 07/20/2018   Dr. Bernarda Caffey  . SILICON OIL REMOVAL Left 07/19/2019   Procedure: 25g PARS PLANA VITRECTOMY WITH SILICON OIL REMOVAL;  Surgeon: Bernarda Caffey, MD;  Location: [facility];  Service: Ophthalmology;  Laterality: Left;  . WISDOM TOOTH EXTRACTION       OB History    Gravida  1   Para  1   Term      Preterm  1   AB      Living  1     SAB      IAB      Ectopic      Multiple  0   Live Births  1           Family History  Problem Relation Age of Onset  . Diabetes Mother   . Aneurysm Mother 11  . Seizures Mother   . Diabetes Father   . Cataracts Father   . COPD Father   . Heart attack Father    . Heart disease Father   . Healthy Sister   . Healthy Daughter   . Stroke Maternal Grandfather   . Amblyopia Neg Hx   . Blindness Neg Hx   . Glaucoma Neg Hx   . Macular degeneration Neg Hx   . Retinal detachment Neg Hx   . Strabismus Neg Hx   . Retinitis pigmentosa Neg Hx   . Colon cancer Neg Hx   . Stomach cancer Neg Hx   . Esophageal cancer Neg Hx   . Pancreatic cancer Neg Hx   . Liver disease Neg Hx     Social History   Tobacco Use  . Smoking status: Never Smoker  . Smokeless tobacco: Never Used  Vaping Use  . Vaping Use: Never used  Substance Use Topics  . Alcohol use: Not  Currently    Comment: SOCIALLY  . Drug use: Never    Home Medications Prior to Admission medications   Medication Sig Start Date End Date Taking? Authorizing Provider  Blood Pressure Monitoring (OMRON 3 SERIES BP MONITOR) DEVI 1 each by Does not apply route daily. Take daily blood pressure. Please report back to Dr. Radford Pax in 2 wks. 08/08/18  Yes Turner, Eber Hong, MD  carvedilol (COREG) 25 MG tablet Take 1 tablet (25 mg total) by mouth 2 (two) times daily. 03/07/19  Yes Imogene Burn, PA-C  diltiazem (CARDIZEM CD) 240 MG 24 hr capsule Take 1 capsule (240 mg total) by mouth daily. 03/08/19  Yes Imogene Burn, PA-C  glucose blood (ONETOUCH VERIO) test strip USE AS DIRECTED 5 TIMES DAILY 12/06/19  Yes Renato Shin, MD  insulin lispro (HUMALOG) 100 UNIT/ML injection Inject 0.8 mLs (80 Units total) into the skin daily. FOR USE IN PUMP, TOTAL OF 80 UNITS PER DAY. 11/26/19  Yes Renato Shin, MD  Insulin Pen Needle (PEN NEEDLES) 32G X 5 MM MISC 1 Device by Does not apply route 4 (four) times daily. 12/06/19  Yes Renato Shin, MD  irbesartan (AVAPRO) 300 MG tablet Take 300 mg by mouth daily. 08/16/19  Yes [provider]  metoCLOPramide (REGLAN) 10 MG tablet Take 1 tablet (10 mg total) by mouth every 8 (eight) hours as needed for up to 3 days for nausea. 09/15/19 11/28/19 Yes Dahal, Marlowe Aschoff, MD   promethazine (PHENERGAN) 25 MG tablet Take 1 tablet (25 mg total) by mouth every 6 (six) hours as needed for nausea or vomiting. 11/28/19  Yes Ladene Artist, MD  hydrALAZINE (APRESOLINE) 50 MG tablet Take 1 tablet (50 mg total) by mouth 3 (three) times daily for 15 days. 12/27/19 01/11/20  Christipher Rieger, PA-C  ondansetron (ZOFRAN ODT) 4 MG disintegrating tablet Take 1 tablet (4 mg total) by mouth every 8 (eight) hours as needed for nausea or vomiting. 12/27/19   Delia Heady, PA-C    Allergies    Patient has no known allergies.  Review of Systems   Review of Systems  Constitutional: Negative for appetite change, chills and fever.  HENT: Negative for ear pain, rhinorrhea, sneezing and sore throat.   Eyes: Negative for photophobia and visual disturbance.  Respiratory: Negative for cough, chest tightness, shortness of breath and wheezing.   Cardiovascular: Negative for chest pain and palpitations.  Gastrointestinal: Positive for abdominal pain and vomiting. Negative for blood in stool, constipation, diarrhea and nausea.  Genitourinary: Negative for dysuria, hematuria and urgency.  Musculoskeletal: Negative for myalgias.  Skin: Negative for rash.  Neurological: Negative for dizziness, weakness and light-headedness.    Physical Exam Updated Vital Signs BP (!) 171/97   Pulse (!) 105   Temp 98 F (36.7 C) (Oral)   Resp 19   Ht 5\' 6"  (1.676 m)   Wt 77.6 kg   LMP 08/27/2019   SpO2 96%   BMI 27.60 kg/m   Physical Exam Vitals and nursing note reviewed.  Constitutional:      General: She is not in acute distress.    Appearance: She is well-developed and well-nourished.  HENT:     Head: Normocephalic and atraumatic.     Nose: Nose normal.  Eyes:     General: No scleral icterus.       Left eye: No discharge.     Extraocular Movements: EOM normal.     Conjunctiva/sclera: Conjunctivae normal.  Cardiovascular:     Rate and  Rhythm: Regular rhythm. Tachycardia present.     Pulses:  Intact distal pulses.     Heart sounds: Normal heart sounds. No murmur heard. No friction rub. No gallop.   Pulmonary:     Effort: Pulmonary effort is normal. No respiratory distress.     Breath sounds: Normal breath sounds.  Abdominal:     General: Bowel sounds are normal. There is no distension.     Palpations: Abdomen is soft.     Tenderness: There is no abdominal tenderness. There is no guarding.  Musculoskeletal:        General: No edema. Normal range of motion.     Cervical back: Normal range of motion and neck supple.  Skin:    General: Skin is warm and dry.     Findings: No rash.  Neurological:     Mental Status: She is alert.     Motor: No abnormal muscle tone.     Coordination: Coordination normal.  Psychiatric:        Mood and Affect: Mood and affect normal.     ED Results / Procedures / Treatments   Labs (all labs ordered are listed, but only abnormal results are displayed) Labs Reviewed  COMPREHENSIVE METABOLIC PANEL - Abnormal; Notable for the following components:      Result Value   Sodium 134 (*)    CO2 21 (*)    Glucose, Bld 304 (*)    BUN 24 (*)    Creatinine, Ser 4.05 (*)    Calcium 8.8 (*)    Albumin 2.2 (*)    AST 14 (*)    GFR, Estimated 15 (*)    All other components within normal limits  CBC - Abnormal; Notable for the following components:   RBC 5.20 (*)    Platelets 608 (*)    All other components within normal limits  CBG MONITORING, ED - Abnormal; Notable for the following components:   Glucose-Capillary 287 (*)    All other components within normal limits  LIPASE, BLOOD  I-STAT BETA HCG BLOOD, ED ([facility], [facility], [facility] ONLY)    EKG None  Radiology No results found.  Procedures Procedures (including critical care time)  Medications Ordered in ED Medications  ondansetron (ZOFRAN-ODT) disintegrating tablet 4 mg (4 mg Oral Given 12/27/19 1520)  metoCLOPramide (REGLAN) injection 10 mg (10 mg Intravenous Given 12/27/19 1832)  sodium chloride  0.9 % bolus 1,000 mL (0 mLs Intravenous Stopped 12/27/19 2049)  hydrALAZINE (APRESOLINE) injection 10 mg (10 mg Intravenous Given 12/27/19 1843)    ED Course  I have reviewed the triage vital signs and the nursing notes.  Pertinent labs & imaging results that were available during my care of the patient were reviewed by me and considered in my medical decision making (see chart for details).  Clinical Course as of 12/27/19 2120  Thu Dec 27, 2019  1839 BP(!): 209/118 Will order IV hydralazine. [HK]  1839 Creatinine(!): 4.05 Will order IV fluids. [HK]  1954 BP(!): 178/100 Improved after hydralazine. [HK]  2022 Patient able to tolerate p.o. intake without difficulty [HK]  2100 BP(!): 163/92 [HK]    Clinical Course User Index [HK] Delia Heady, PA-C   MDM Rules/Calculators/A&P                          29 year old female with a past medical history of type 1 diabetes, hypertension, CKD presenting to the ED with a chief complaint of abdominal pain, emesis and  hypertension.  Symptoms began about 2 days ago.  She has not been able to take her blood pressure medication secondary to her emesis.  She reports generalized abdominal pain from all the vomiting.  No chest pain.  Minimal improvement noted with home Phenergan.  On exam abdomen is soft, nontender nondistended.  She is hypertensive to 630 systolic.  Lab work significant for creatinine of 4 which is slightly higher than prior about 1 week ago when it was 3.7.  CBC is unremarkable.  CBG in the 300s.  Lipase is normal.  hCG is negative.  Patient was given IV hydralazine, IV Reglan and IV fluids here with improvement in her symptoms.  Her blood pressure improved to 160F systolic.  She is able to tolerate p.o. intake without difficulty.  I feel that we have adequately hydrated her to help with her AKI.  Had a shared decision making discussion with the patient regarding her work-up here as well as assessing for symptom improvement.  She is comfortable  with following up and continue her home medications.  She is requesting a refill of her antihypertensives which I will provide for her.  We will also treat symptomatically with antiemetics and antispasmodics.  Told her that she should increase her hydration and follow-up with her primary care provider.  At this time I believe her AKI was from her vomiting and her hypertension is from her lack of control with home medications due to her vomiting.  Return precautions given.   Patient is hemodynamically stable, in NAD, and able to ambulate in the ED. Evaluation does not show pathology that would require ongoing emergent intervention or inpatient treatment. I explained the diagnosis to the patient. Pain has been managed and has no complaints prior to discharge. Patient is comfortable with above plan and is stable for discharge at this time. All questions were answered prior to disposition. Strict return precautions for returning to the ED were discussed. Encouraged follow up with PCP.   An After Visit Summary was printed and given to the patient.   Portions of this note were generated with [employer]. Dictation errors may occur despite best attempts at proofreading.  Final Clinical Impression(s) / ED Diagnoses Final diagnoses:  Hypertensive urgency  AKI (acute kidney injury) ([place])    Rx / DC Orders ED Discharge Orders         Ordered    hydrALAZINE (APRESOLINE) 50 MG tablet  3 times daily        12/27/19 2108    ondansetron (ZOFRAN ODT) 4 MG disintegrating tablet  Every 8 hours PRN        12/27/19 2108           Delia Heady, PA-C 12/27/19 2120    Carmin Muskrat, MD 12/27/19 2337

## 2019-12-27 NOTE — Discharge Instructions (Addendum)
It is important for you to take your blood pressure medications as prescribed. Follow-up with your primary care provider. Make sure you are drinking plenty of fluids to prevent dehydration. Return to the ER if you start to experience worsening vomiting, chest pain, shortness of breath, headache or blurry vision

## 2020-01-11 LAB — CATECHOLAMINES, FRACTIONATED, PLASMA
Dopamine: <20 pg/mL
Epinephrine: <40 pg/mL
Norepinephrine: 497 pg/mL
Total Catecholamines: 497 pg/mL

## 2020-01-11 LAB — ALDOSTERONE + RENIN ACTIVITY W/ RATIO
ALDO / PRA Ratio: 3.3 Ratio (ref 0.9–28.9)
Aldosterone: 2 ng/dL
Renin Activity: 0.6 ng/mL/h (ref 0.25–5.82)

## 2020-01-11 LAB — METANEPHRINES, PLASMA
Metanephrine, Free: 30 pg/mL (ref <=57)
Normetanephrine, Free: 100 pg/mL (ref <=148)
Total Metanephrines-Plasma: 130 pg/mL (ref <=205)

## 2020-01-18 ENCOUNTER — Other Ambulatory Visit: Payer: Self-pay

## 2020-01-18 DIAGNOSIS — R112 Nausea with vomiting, unspecified: Secondary | ICD-10-CM | POA: Diagnosis not present

## 2020-01-18 DIAGNOSIS — E109 Type 1 diabetes mellitus without complications: Secondary | ICD-10-CM | POA: Diagnosis not present

## 2020-01-18 DIAGNOSIS — R079 Chest pain, unspecified: Secondary | ICD-10-CM | POA: Diagnosis not present

## 2020-01-18 DIAGNOSIS — Z5321 Procedure and treatment not carried out due to patient leaving prior to being seen by health care provider: Secondary | ICD-10-CM | POA: Diagnosis not present

## 2020-01-18 LAB — URINALYSIS, ROUTINE W REFLEX MICROSCOPIC
Bilirubin Urine: NEGATIVE
Glucose, UA: 150 mg/dL — AB
Hgb urine dipstick: NEGATIVE
Ketones, ur: 5 mg/dL — AB
Nitrite: NEGATIVE
Protein, ur: >=300 mg/dL — AB
Specific Gravity, Urine: 1.014 (ref 1.005–1.030)
pH: 6 (ref 5.0–8.0)

## 2020-01-18 LAB — COMPREHENSIVE METABOLIC PANEL
ALT: 14 U/L (ref 0–44)
AST: 14 U/L — ABNORMAL LOW (ref 15–41)
Albumin: 2.4 g/dL — ABNORMAL LOW (ref 3.5–5.0)
Alkaline Phosphatase: 81 U/L (ref 38–126)
Anion gap: 12 (ref 5–15)
BUN: 34 mg/dL — ABNORMAL HIGH (ref 6–20)
CO2: 17 mmol/L — ABNORMAL LOW (ref 22–32)
Calcium: 8.8 mg/dL — ABNORMAL LOW (ref 8.9–10.3)
Chloride: 106 mmol/L (ref 98–111)
Creatinine, Ser: 4.81 mg/dL — ABNORMAL HIGH (ref 0.44–1.00)
GFR, Estimated: 12 mL/min — ABNORMAL LOW (ref >60)
Glucose, Bld: 182 mg/dL — ABNORMAL HIGH (ref 70–99)
Potassium: 4.5 mmol/L (ref 3.5–5.1)
Sodium: 135 mmol/L (ref 135–145)
Total Bilirubin: 0.8 mg/dL (ref 0.3–1.2)
Total Protein: 7.4 g/dL (ref 6.5–8.1)

## 2020-01-18 LAB — CBC
HCT: 39 % (ref 36.0–46.0)
Hemoglobin: 12.3 g/dL (ref 12.0–15.0)
MCH: 25.4 pg — ABNORMAL LOW (ref 26.0–34.0)
MCHC: 31.5 g/dL (ref 30.0–36.0)
MCV: 80.4 fL (ref 80.0–100.0)
Platelets: 553 10*3/uL — ABNORMAL HIGH (ref 150–400)
RBC: 4.85 MIL/uL (ref 3.87–5.11)
RDW: 14.6 % (ref 11.5–15.5)
WBC: 8 10*3/uL (ref 4.0–10.5)
nRBC: 0 % (ref 0.0–0.2)

## 2020-01-18 LAB — I-STAT BETA HCG BLOOD, ED (MC, WL, AP ONLY): I-stat hCG, quantitative: <5 m[IU]/mL (ref <5)

## 2020-01-18 LAB — LIPASE, BLOOD: Lipase: 26 U/L (ref 11–51)

## 2020-01-18 NOTE — ED Triage Notes (Signed)
Pt reports chest pain, nausea and vomiting for 2 days. States that she is a type 1 diabetic and that she hasn't been using her insulin because she hasn't been eating. Denies any sick contacts.

## 2020-01-19 ENCOUNTER — Emergency Department (HOSPITAL_COMMUNITY)
Admission: EM | Admit: 2020-01-19 | Discharge: 2020-01-19 | Disposition: A | Discharge status: Home / Self Care | Payer: 59 | Attending: Emergency Medicine | Admitting: Emergency Medicine

## 2020-01-19 NOTE — ED Notes (Signed)
Pt returned her stickers to registration.  

## 2020-01-22 ENCOUNTER — Encounter (HOSPITAL_COMMUNITY): Payer: Self-pay | Admitting: Emergency Medicine

## 2020-01-22 ENCOUNTER — Other Ambulatory Visit: Payer: Self-pay

## 2020-01-22 ENCOUNTER — Emergency Department (HOSPITAL_COMMUNITY)
Admission: EM | Admit: 2020-01-22 | Discharge: 2020-01-22 | Disposition: A | Discharge status: Home / Self Care | Payer: 59 | Attending: Emergency Medicine | Admitting: Emergency Medicine

## 2020-01-22 DIAGNOSIS — R112 Nausea with vomiting, unspecified: Secondary | ICD-10-CM | POA: Diagnosis present

## 2020-01-22 DIAGNOSIS — Z5321 Procedure and treatment not carried out due to patient leaving prior to being seen by health care provider: Secondary | ICD-10-CM | POA: Diagnosis not present

## 2020-01-22 LAB — CBG MONITORING, ED: Glucose-Capillary: 208 mg/dL — ABNORMAL HIGH (ref 70–99)

## 2020-01-22 NOTE — ED Triage Notes (Signed)
Per pt, states she started vomiting yesterday and "cant stop"-denies abdominal pain

## 2020-01-24 ENCOUNTER — Emergency Department: Payer: 59

## 2020-01-24 ENCOUNTER — Inpatient Hospital Stay
Admission: EM | Admit: 2020-01-24 | Discharge: 2020-01-27 | DRG: 638 | Disposition: A | Discharge status: Home / Self Care | Payer: 59 | Attending: Internal Medicine | Admitting: Internal Medicine

## 2020-01-24 ENCOUNTER — Other Ambulatory Visit: Payer: Self-pay

## 2020-01-24 DIAGNOSIS — I120 Hypertensive chronic kidney disease with stage 5 chronic kidney disease or end stage renal disease: Secondary | ICD-10-CM | POA: Diagnosis present

## 2020-01-24 DIAGNOSIS — Z833 Family history of diabetes mellitus: Secondary | ICD-10-CM

## 2020-01-24 DIAGNOSIS — Z20822 Contact with and (suspected) exposure to covid-19: Secondary | ICD-10-CM | POA: Diagnosis present

## 2020-01-24 DIAGNOSIS — E871 Hypo-osmolality and hyponatremia: Secondary | ICD-10-CM | POA: Diagnosis present

## 2020-01-24 DIAGNOSIS — Z794 Long term (current) use of insulin: Secondary | ICD-10-CM

## 2020-01-24 DIAGNOSIS — Z8249 Family history of ischemic heart disease and other diseases of the circulatory system: Secondary | ICD-10-CM

## 2020-01-24 DIAGNOSIS — Z825 Family history of asthma and other chronic lower respiratory diseases: Secondary | ICD-10-CM

## 2020-01-24 DIAGNOSIS — E1065 Type 1 diabetes mellitus with hyperglycemia: Principal | ICD-10-CM | POA: Diagnosis present

## 2020-01-24 DIAGNOSIS — R680 Hypothermia, not associated with low environmental temperature: Secondary | ICD-10-CM | POA: Diagnosis present

## 2020-01-24 DIAGNOSIS — N185 Chronic kidney disease, stage 5: Secondary | ICD-10-CM | POA: Diagnosis present

## 2020-01-24 DIAGNOSIS — N179 Acute kidney failure, unspecified: Secondary | ICD-10-CM | POA: Diagnosis present

## 2020-01-24 DIAGNOSIS — E1022 Type 1 diabetes mellitus with diabetic chronic kidney disease: Secondary | ICD-10-CM | POA: Diagnosis present

## 2020-01-24 DIAGNOSIS — Z823 Family history of stroke: Secondary | ICD-10-CM

## 2020-01-24 DIAGNOSIS — Z820 Family history of epilepsy and other diseases of the nervous system: Secondary | ICD-10-CM | POA: Diagnosis not present

## 2020-01-24 DIAGNOSIS — E10649 Type 1 diabetes mellitus with hypoglycemia without coma: Secondary | ICD-10-CM | POA: Diagnosis present

## 2020-01-24 DIAGNOSIS — F32A Depression, unspecified: Secondary | ICD-10-CM | POA: Diagnosis present

## 2020-01-24 DIAGNOSIS — T68XXXA Hypothermia, initial encounter: Secondary | ICD-10-CM | POA: Diagnosis not present

## 2020-01-24 DIAGNOSIS — E1043 Type 1 diabetes mellitus with diabetic autonomic (poly)neuropathy: Secondary | ICD-10-CM | POA: Diagnosis present

## 2020-01-24 DIAGNOSIS — Y92410 Unspecified street and highway as the place of occurrence of the external cause: Secondary | ICD-10-CM

## 2020-01-24 DIAGNOSIS — K3184 Gastroparesis: Secondary | ICD-10-CM | POA: Diagnosis present

## 2020-01-24 DIAGNOSIS — Z79899 Other long term (current) drug therapy: Secondary | ICD-10-CM | POA: Diagnosis not present

## 2020-01-24 DIAGNOSIS — E103413 Type 1 diabetes mellitus with severe nonproliferative diabetic retinopathy with macular edema, bilateral: Secondary | ICD-10-CM | POA: Diagnosis present

## 2020-01-24 DIAGNOSIS — D631 Anemia in chronic kidney disease: Secondary | ICD-10-CM | POA: Diagnosis present

## 2020-01-24 DIAGNOSIS — Z9641 Presence of insulin pump (external) (internal): Secondary | ICD-10-CM | POA: Diagnosis present

## 2020-01-24 DIAGNOSIS — H35039 Hypertensive retinopathy, unspecified eye: Secondary | ICD-10-CM | POA: Diagnosis present

## 2020-01-24 DIAGNOSIS — T383X5A Adverse effect of insulin and oral hypoglycemic [antidiabetic] drugs, initial encounter: Secondary | ICD-10-CM | POA: Diagnosis not present

## 2020-01-24 DIAGNOSIS — E109 Type 1 diabetes mellitus without complications: Secondary | ICD-10-CM | POA: Diagnosis present

## 2020-01-24 DIAGNOSIS — E160 Drug-induced hypoglycemia without coma: Secondary | ICD-10-CM | POA: Diagnosis not present

## 2020-01-24 DIAGNOSIS — E10319 Type 1 diabetes mellitus with unspecified diabetic retinopathy without macular edema: Secondary | ICD-10-CM | POA: Diagnosis not present

## 2020-01-24 LAB — CBC WITH DIFFERENTIAL/PLATELET
Abs Immature Granulocytes: 0.04 10*3/uL (ref 0.00–0.07)
Basophils Absolute: 0.1 10*3/uL (ref 0.0–0.1)
Basophils Relative: 1 %
Eosinophils Absolute: 0.1 10*3/uL (ref 0.0–0.5)
Eosinophils Relative: 1 %
HCT: 43.9 % (ref 36.0–46.0)
Hemoglobin: 14 g/dL (ref 12.0–15.0)
Immature Granulocytes: 1 %
Lymphocytes Relative: 12 %
Lymphs Abs: 0.8 10*3/uL (ref 0.7–4.0)
MCH: 25.5 pg — ABNORMAL LOW (ref 26.0–34.0)
MCHC: 31.9 g/dL (ref 30.0–36.0)
MCV: 80 fL (ref 80.0–100.0)
Monocytes Absolute: 0.4 10*3/uL (ref 0.1–1.0)
Monocytes Relative: 5 %
Neutro Abs: 5.7 10*3/uL (ref 1.7–7.7)
Neutrophils Relative %: 80 %
Platelets: 521 10*3/uL — ABNORMAL HIGH (ref 150–400)
RBC: 5.49 MIL/uL — ABNORMAL HIGH (ref 3.87–5.11)
RDW: 14 % (ref 11.5–15.5)
WBC: 7.1 10*3/uL (ref 4.0–10.5)
nRBC: 0 % (ref 0.0–0.2)

## 2020-01-24 LAB — COMPREHENSIVE METABOLIC PANEL
ALT: 19 U/L (ref 0–44)
AST: 22 U/L (ref 15–41)
Albumin: 2.5 g/dL — ABNORMAL LOW (ref 3.5–5.0)
Alkaline Phosphatase: 84 U/L (ref 38–126)
Anion gap: 11 (ref 5–15)
BUN: 34 mg/dL — ABNORMAL HIGH (ref 6–20)
CO2: 21 mmol/L — ABNORMAL LOW (ref 22–32)
Calcium: 8.5 mg/dL — ABNORMAL LOW (ref 8.9–10.3)
Chloride: 96 mmol/L — ABNORMAL LOW (ref 98–111)
Creatinine, Ser: 4.53 mg/dL — ABNORMAL HIGH (ref 0.44–1.00)
GFR, Estimated: 13 mL/min — ABNORMAL LOW (ref >60)
Glucose, Bld: 354 mg/dL — ABNORMAL HIGH (ref 70–99)
Potassium: 3.3 mmol/L — ABNORMAL LOW (ref 3.5–5.1)
Sodium: 128 mmol/L — ABNORMAL LOW (ref 135–145)
Total Bilirubin: 0.6 mg/dL (ref 0.3–1.2)
Total Protein: 7.6 g/dL (ref 6.5–8.1)

## 2020-01-24 LAB — URINALYSIS, COMPLETE (UACMP) WITH MICROSCOPIC
Bacteria, UA: NONE SEEN
Bilirubin Urine: NEGATIVE
Glucose, UA: >=500 mg/dL — AB
Hgb urine dipstick: NEGATIVE
Ketones, ur: NEGATIVE mg/dL
Nitrite: NEGATIVE
Protein, ur: >=300 mg/dL — AB
Specific Gravity, Urine: 1.009 (ref 1.005–1.030)
Squamous Epithelial / HPF: NONE SEEN (ref 0–5)
pH: 6 (ref 5.0–8.0)

## 2020-01-24 LAB — CBG MONITORING, ED
Glucose-Capillary: 117 mg/dL — ABNORMAL HIGH (ref 70–99)
Glucose-Capillary: 251 mg/dL — ABNORMAL HIGH (ref 70–99)
Glucose-Capillary: 281 mg/dL — ABNORMAL HIGH (ref 70–99)
Glucose-Capillary: 369 mg/dL — ABNORMAL HIGH (ref 70–99)

## 2020-01-24 LAB — PROTIME-INR
INR: 1 (ref 0.8–1.2)
Prothrombin Time: 12.6 seconds (ref 11.4–15.2)

## 2020-01-24 LAB — TSH: TSH: 1.482 u[IU]/mL (ref 0.350–4.500)

## 2020-01-24 LAB — LACTIC ACID, PLASMA: Lactic Acid, Venous: 1.3 mmol/L (ref 0.5–1.9)

## 2020-01-24 LAB — POC URINE PREG, ED: Preg Test, Ur: NEGATIVE

## 2020-01-24 LAB — APTT: aPTT: 26 seconds (ref 24–36)
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
## 2020-01-24 MED ORDER — CARVEDILOL 25 MG PO TABS
25.0000 mg | ORAL_TABLET | Freq: Two times a day (BID) | ORAL | Status: DC
  Filled 2020-01-24: qty 1

## 2020-01-24 MED ORDER — PROMETHAZINE HCL 25 MG PO TABS
25.0000 mg | ORAL_TABLET | Freq: Four times a day (QID) | ORAL | Status: DC | PRN
  Administered 2020-01-26: 25 mg via ORAL
  Filled 2020-01-24 (×2): qty 1

## 2020-01-24 MED ORDER — DILTIAZEM HCL ER COATED BEADS 240 MG PO CP24
240.0000 mg | ORAL_CAPSULE | Freq: Every day | ORAL | Status: DC
  Filled 2020-01-24: qty 1

## 2020-01-24 MED ORDER — SODIUM CHLORIDE 0.9 % IV SOLN
1.0000 g | Freq: Once | INTRAVENOUS | Status: AC
  Administered 2020-01-24: 1 g via INTRAVENOUS
  Filled 2020-01-24: qty 10

## 2020-01-24 MED ORDER — SODIUM CHLORIDE 0.9 % IV SOLN: INTRAVENOUS | Status: DC

## 2020-01-24 MED ORDER — HEPARIN SODIUM (PORCINE) 5000 UNIT/ML IJ SOLN
5000.0000 [IU] | Freq: Two times a day (BID) | INTRAMUSCULAR | Status: DC
  Administered 2020-01-24 – 2020-01-27 (×6): 5000 [IU] via SUBCUTANEOUS
  Filled 2020-01-24 (×6): qty 1

## 2020-01-24 MED ORDER — ONDANSETRON 4 MG PO TBDP
4.0000 mg | ORAL_TABLET | Freq: Three times a day (TID) | ORAL | Status: DC | PRN
  Filled 2020-01-24: qty 1

## 2020-01-24 MED ORDER — METOCLOPRAMIDE HCL 10 MG PO TABS: 10.0000 mg | ORAL_TABLET | Freq: Three times a day (TID) | ORAL | Status: DC | PRN

## 2020-01-24 MED ORDER — HYDRALAZINE HCL 50 MG PO TABS
50.0000 mg | ORAL_TABLET | Freq: Four times a day (QID) | ORAL | Status: DC | PRN
  Administered 2020-01-25 – 2020-01-26 (×3): 50 mg via ORAL
  Filled 2020-01-24 (×4): qty 1

## 2020-01-24 MED ORDER — INSULIN ASPART 100 UNIT/ML ~~LOC~~ SOLN: 0.0000 [IU] | Freq: Three times a day (TID) | SUBCUTANEOUS | Status: DC

## 2020-01-24 MED ORDER — INSULIN ASPART 100 UNIT/ML ~~LOC~~ SOLN: 5.0000 [IU] | Freq: Three times a day (TID) | SUBCUTANEOUS | Status: DC

## 2020-01-24 NOTE — ED Notes (Addendum)
Phlebotomist at bedside.  Dr. Jacqualine Code notified of bgl and states patient may turn her insulin pump back on to manage herself. Pt awake, oriented, drowsy. Answers questions appropriately and able to follow all commands. Pt sts not able to void for specimen at this time. Will try again.
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
## 2020-01-24 NOTE — ED Notes (Signed)
Contacted lab to verify labs had been received. Sts they are experiencing large volumes and will run bloodwork soon.

## 2020-01-24 NOTE — ED Notes (Signed)
Phlebotomist notified to collect remaining labs.

## 2020-01-24 NOTE — ED Provider Notes (Signed)
-----------------------------------------   11:08 PM on 01/24/2020 -----------------------------------------  I took over care of this patient from Dr. Jacqualine Code.  I gave report to the hospitalist Dr. Roosevelt Locks for admission.   Arta Silence, MD 01/24/20 2308

## 2020-01-24 NOTE — ED Notes (Signed)
Pt drank 8oz orange juice, per instruction from Dr. Jacqualine Code. Tolerated well without nausea or vomiting.

## 2020-01-24 NOTE — ED Notes (Signed)
Attempted to recheck oral temp. Unable to obtain. Bair hugger remains on high setting.
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
## 2020-01-24 NOTE — ED Notes (Signed)
Dr. Jacqualine Code at bedside to update on plan of care

## 2020-01-24 NOTE — ED Notes (Signed)
Pt returned from CT. Monitor and warming blanket reapplied. Officer at bedside speaking with patient. Boyfriend at bedside at this time.
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
## 2020-01-24 NOTE — ED Notes (Signed)
Patient transported to X-ray 

## 2020-01-25 ENCOUNTER — Encounter: Payer: Self-pay | Admitting: Internal Medicine

## 2020-01-25 ENCOUNTER — Inpatient Hospital Stay: Payer: 59

## 2020-01-25 DIAGNOSIS — T68XXXA Hypothermia, initial encounter: Secondary | ICD-10-CM | POA: Diagnosis not present

## 2020-01-25 LAB — CBC
HCT: 33.8 % — ABNORMAL LOW (ref 36.0–46.0)
Hemoglobin: 10.9 g/dL — ABNORMAL LOW (ref 12.0–15.0)
MCH: 25.6 pg — ABNORMAL LOW (ref 26.0–34.0)
MCHC: 32.2 g/dL (ref 30.0–36.0)
MCV: 79.5 fL — ABNORMAL LOW (ref 80.0–100.0)
Platelets: 456 10*3/uL — ABNORMAL HIGH (ref 150–400)
RBC: 4.25 MIL/uL (ref 3.87–5.11)
RDW: 14.1 % (ref 11.5–15.5)
WBC: 6.4 10*3/uL (ref 4.0–10.5)
nRBC: 0 % (ref 0.0–0.2)

## 2020-01-25 LAB — URINE DRUG SCREEN, QUALITATIVE (ARMC ONLY)
Amphetamines, Ur Screen: NOT DETECTED
Barbiturates, Ur Screen: NOT DETECTED
Benzodiazepine, Ur Scrn: NOT DETECTED
Cannabinoid 50 Ng, Ur ~~LOC~~: NOT DETECTED
Cocaine Metabolite,Ur ~~LOC~~: NOT DETECTED
MDMA (Ecstasy)Ur Screen: NOT DETECTED
Methadone Scn, Ur: NOT DETECTED
Opiate, Ur Screen: NOT DETECTED
Phencyclidine (PCP) Ur S: NOT DETECTED
Tricyclic, Ur Screen: NOT DETECTED

## 2020-01-25 LAB — IRON AND TIBC
Iron: 33 ug/dL (ref 28–170)
Saturation Ratios: 17 % (ref 10.4–31.8)
TIBC: 192 ug/dL — ABNORMAL LOW (ref 250–450)
UIBC: 159 ug/dL

## 2020-01-25 LAB — BASIC METABOLIC PANEL
Anion gap: 9 (ref 5–15)
BUN: 41 mg/dL — ABNORMAL HIGH (ref 6–20)
CO2: 21 mmol/L — ABNORMAL LOW (ref 22–32)
Calcium: 7.6 mg/dL — ABNORMAL LOW (ref 8.9–10.3)
Chloride: 104 mmol/L (ref 98–111)
Creatinine, Ser: 4.54 mg/dL — ABNORMAL HIGH (ref 0.44–1.00)
GFR, Estimated: 13 mL/min — ABNORMAL LOW (ref >60)
Glucose, Bld: 146 mg/dL — ABNORMAL HIGH (ref 70–99)
Potassium: 3.7 mmol/L (ref 3.5–5.1)
Sodium: 134 mmol/L — ABNORMAL LOW (ref 135–145)

## 2020-01-25 LAB — CBG MONITORING, ED
Glucose-Capillary: 143 mg/dL — ABNORMAL HIGH (ref 70–99)
Glucose-Capillary: 189 mg/dL — ABNORMAL HIGH (ref 70–99)
Glucose-Capillary: 235 mg/dL — ABNORMAL HIGH (ref 70–99)
Glucose-Capillary: 72 mg/dL (ref 70–99)

## 2020-01-25 LAB — SARS CORONAVIRUS 2 (TAT 6-24 HRS): SARS Coronavirus 2: NEGATIVE

## 2020-01-25 LAB — PHOSPHORUS: Phosphorus: 4.6 mg/dL (ref 2.5–4.6)

## 2020-01-25 LAB — FOLATE: Folate: 5.7 ng/mL — ABNORMAL LOW (ref >5.9)

## 2020-01-25 LAB — FERRITIN: Ferritin: 24 ng/mL (ref 11–307)

## 2020-01-25 IMAGING — US US RENAL
1 series · 14 of 25 positions shown · non-contrast
Comparison: None.

CLINICAL DATA: CKD stage 4

EXAM:
RENAL / URINARY TRACT ULTRASOUND COMPLETE

[Series 1: us renal · 14 of 65 slices shown]
[im 1/65]
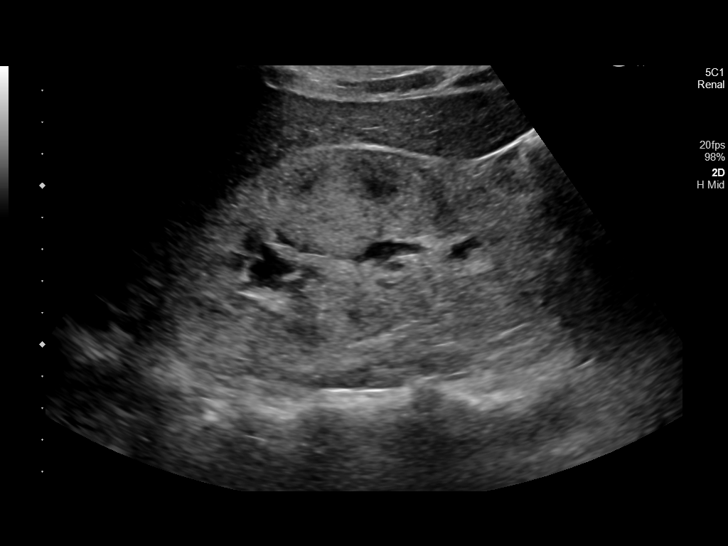
[im 6/65]
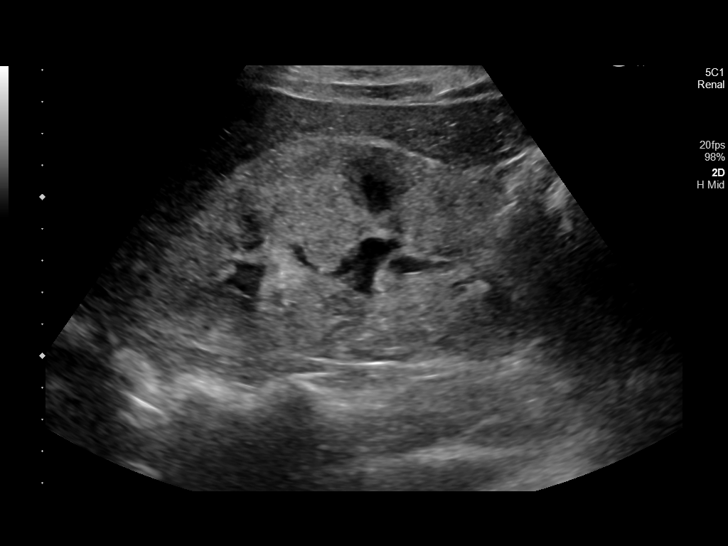
[im 11/65]
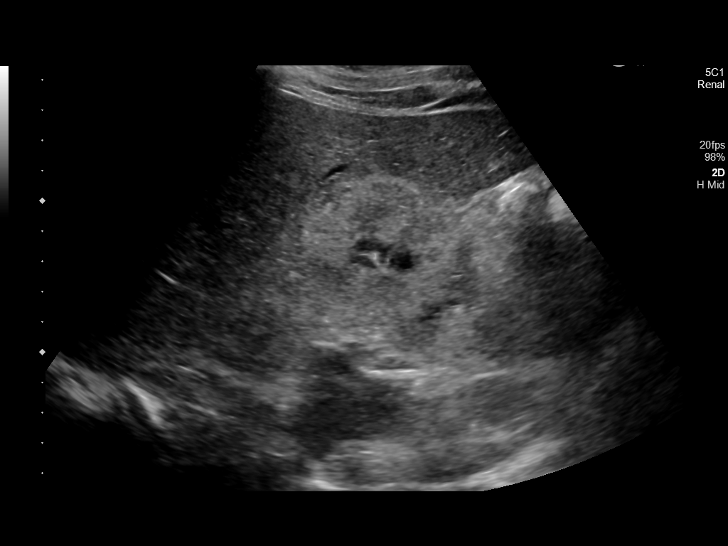
[im 17/65]
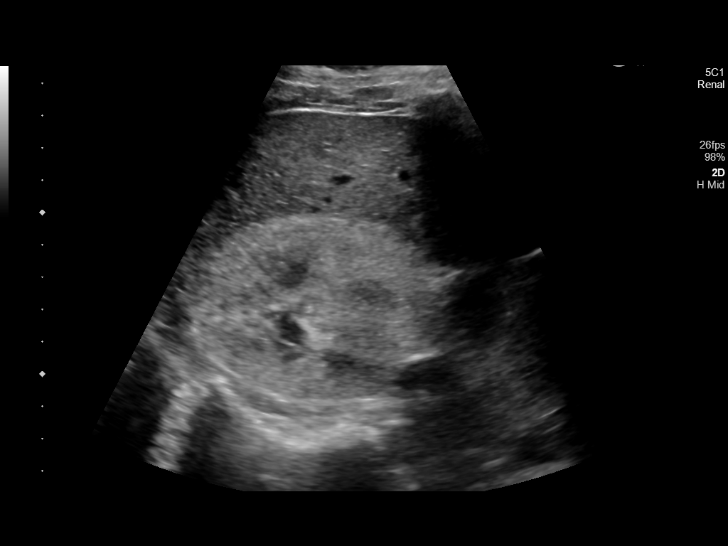
[im 22/65]
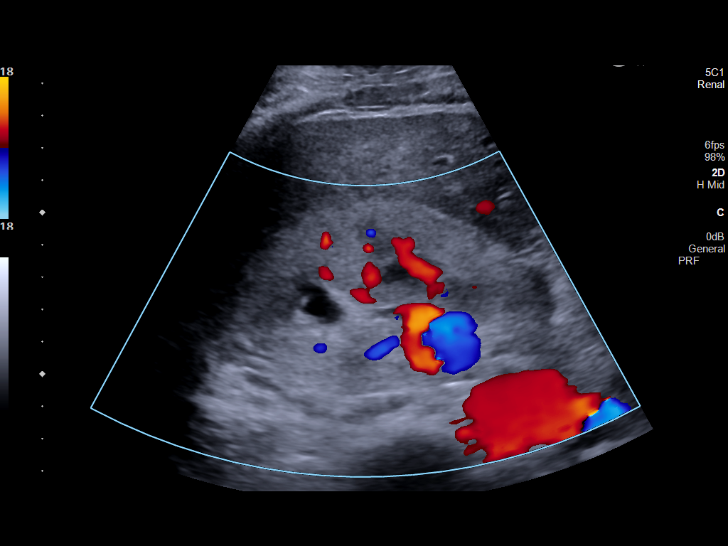
[im 25/65]
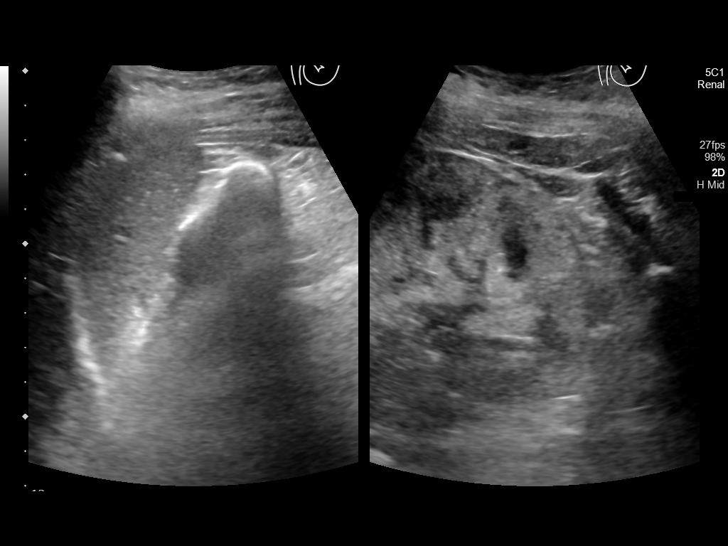
[im 30/65]
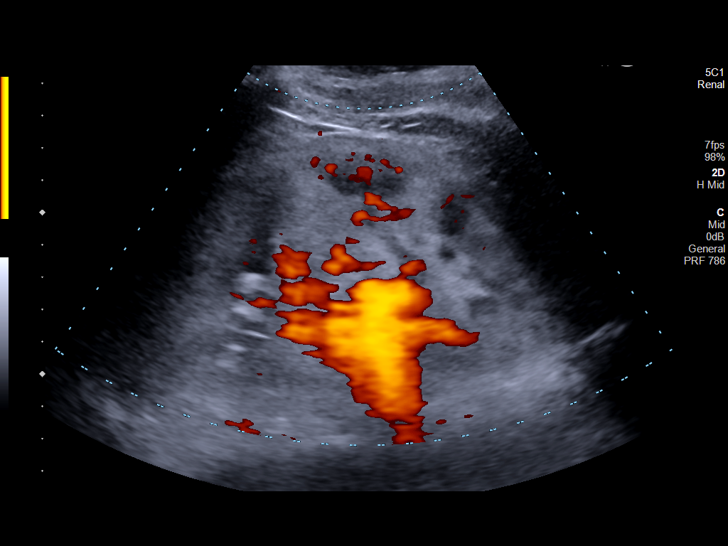
[im 35/65]
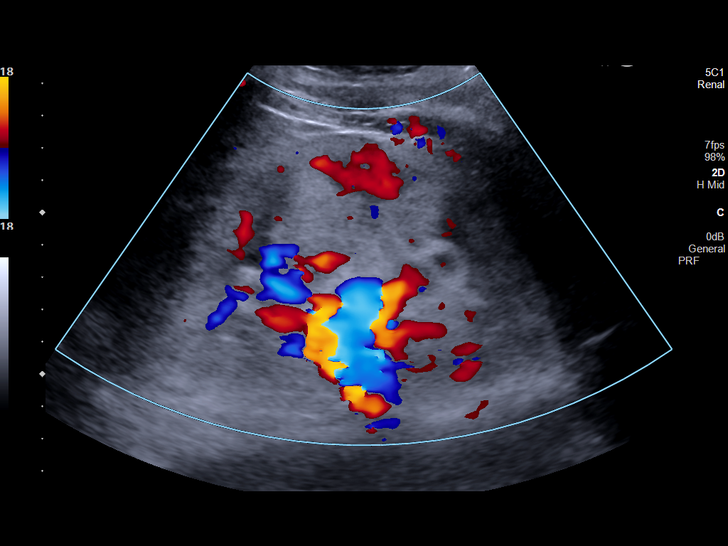
[im 41/65]
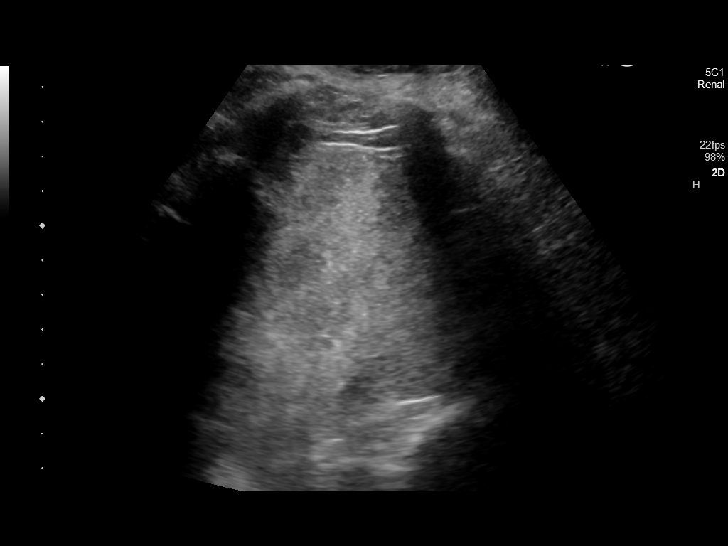
[im 43/65]
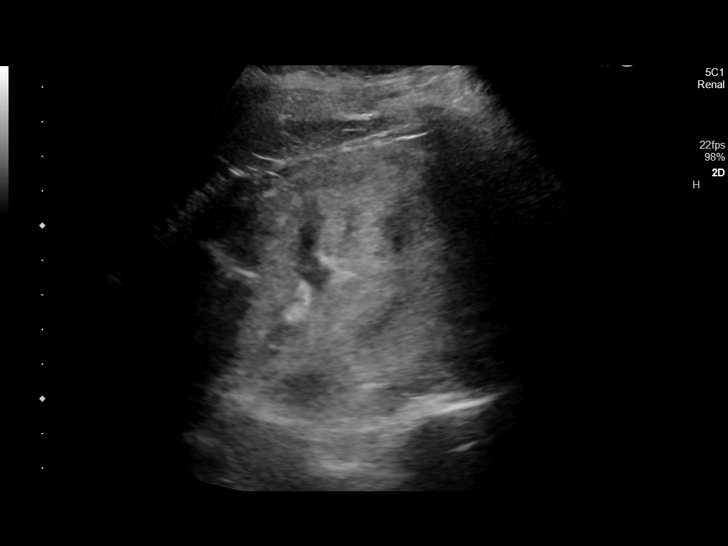
[im 49/65]
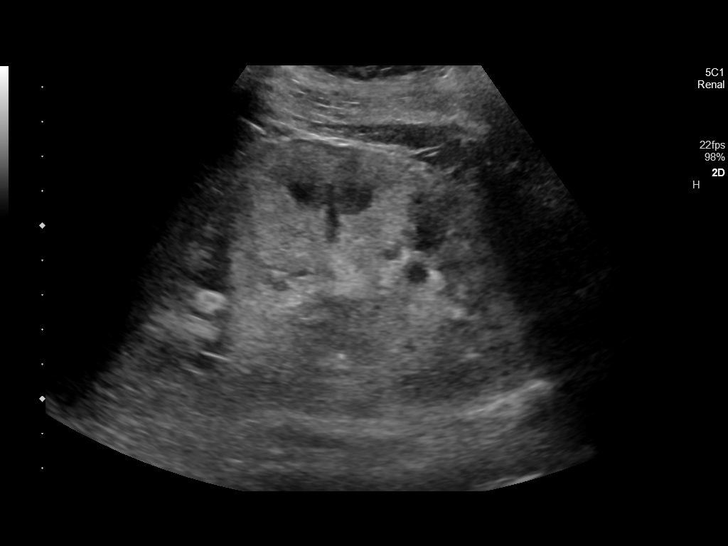
[im 54/65]
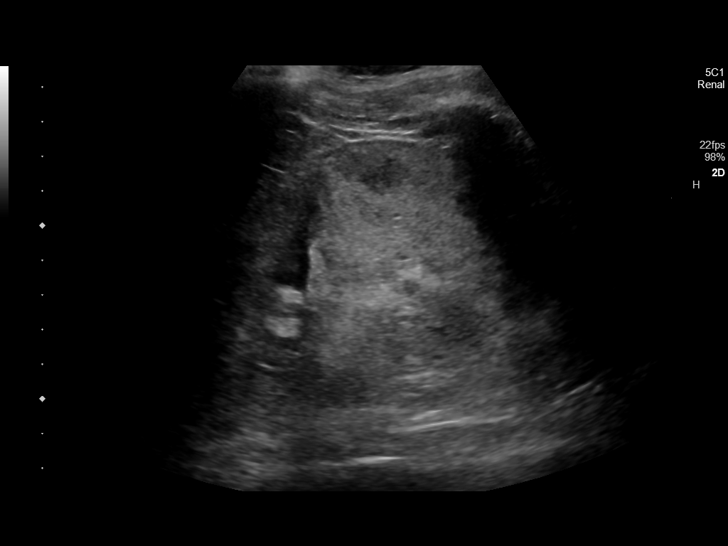
[im 59/65]
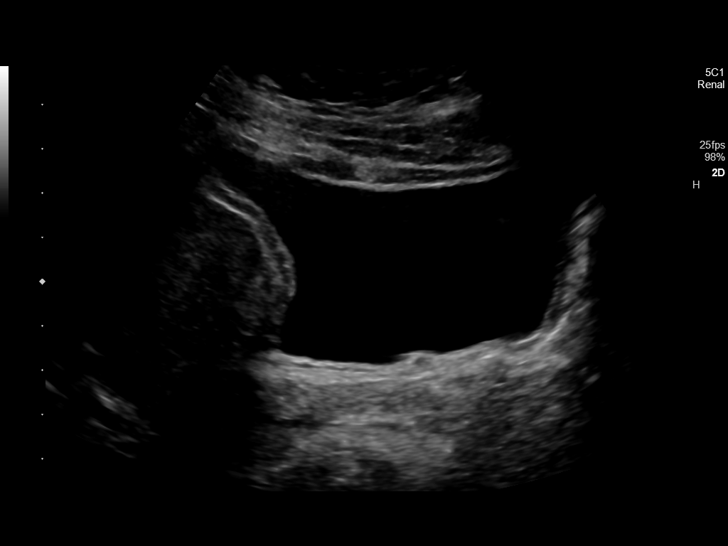
[im 65/65]
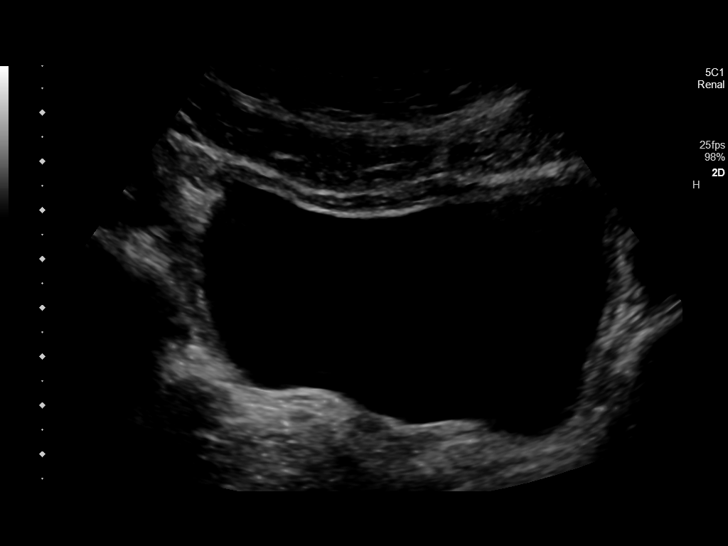

[14 of 25 positions shown; findings below may reference images not displayed]

FINDINGS: Right Kidney:

Renal measurements: 12.8 x 7.1 x 7 cm = volume: 333 mL. There is
significantly increased cortical echogenicity. There is mild
collecting system dilatation.

Left Kidney:

Renal measurements: 12.3 x 7.7 x 7 cm = volume: 343 mL. There are
multiple small echogenic foci suggestive of nonobstructing stones.
There is increased cortical echogenicity. There is mild collecting
system dilatation.

Bladder:

Appears normal for degree of bladder distention.

Other:

None.
IMPRESSION: 1. Echogenic kidneys bilaterally which can be seen in patients with
medical renal disease.
2. Mild bilateral collecting system dilatation.
3. Probable nonobstructing stones on the left measuring up to
approximately 1 cm.

## 2020-01-25 MED ORDER — INSULIN ASPART 100 UNIT/ML ~~LOC~~ SOLN: 0.0000 [IU] | Freq: Three times a day (TID) | SUBCUTANEOUS | Status: DC

## 2020-01-25 MED ORDER — AMLODIPINE BESYLATE 5 MG PO TABS
2.5000 mg | ORAL_TABLET | Freq: Every day | ORAL | Status: DC
  Administered 2020-01-26: 2.5 mg via ORAL
  Filled 2020-01-25: qty 1

## 2020-01-25 MED ORDER — SODIUM BICARBONATE 650 MG PO TABS
650.0000 mg | ORAL_TABLET | Freq: Every day | ORAL | Status: DC
Start: 2020-01-25 — End: 2020-01-27
  Administered 2020-01-26 – 2020-01-27 (×2): 650 mg via ORAL
  Filled 2020-01-25 (×3): qty 1

## 2020-01-25 MED ORDER — SERTRALINE HCL 50 MG PO TABS
50.0000 mg | ORAL_TABLET | Freq: Every day | ORAL | Status: DC
Start: 2020-01-25 — End: 2020-01-27
  Administered 2020-01-26 – 2020-01-27 (×2): 50 mg via ORAL
  Filled 2020-01-25 (×2): qty 1

## 2020-01-25 MED ORDER — INSULIN GLARGINE 100 UNIT/ML ~~LOC~~ SOLN
15.0000 [IU] | Freq: Every day | SUBCUTANEOUS | Status: DC
  Administered 2020-01-25: 15 [IU] via SUBCUTANEOUS
  Filled 2020-01-25 (×3): qty 0.15

## 2020-01-25 MED ORDER — PNEUMOCOCCAL VAC POLYVALENT 25 MCG/0.5ML IJ INJ: 0.5000 mL | INJECTION | INTRAMUSCULAR | Status: DC

## 2020-01-25 MED ORDER — INSULIN ASPART 100 UNIT/ML ~~LOC~~ SOLN
4.0000 [IU] | Freq: Three times a day (TID) | SUBCUTANEOUS | Status: DC
  Administered 2020-01-26 – 2020-01-27 (×2): 4 [IU] via SUBCUTANEOUS
  Filled 2020-01-25 (×2): qty 1

## 2020-01-25 MED ORDER — CARVEDILOL 6.25 MG PO TABS
6.2500 mg | ORAL_TABLET | Freq: Two times a day (BID) | ORAL | Status: DC
  Administered 2020-01-26: 6.25 mg via ORAL
  Filled 2020-01-25: qty 1

## 2020-01-25 MED ORDER — HYDRALAZINE HCL 50 MG PO TABS
50.0000 mg | ORAL_TABLET | Freq: Three times a day (TID) | ORAL | Status: DC
  Administered 2020-01-25 – 2020-01-27 (×5): 50 mg via ORAL
  Filled 2020-01-25 (×4): qty 1

## 2020-01-25 MED ORDER — LACTATED RINGERS IV SOLN: INTRAVENOUS | Status: DC

## 2020-01-25 NOTE — ED Notes (Signed)
Diabetic educator in the room with patient. Sonia Baller ( educator) advised that insulin pump will come out and we will cover with our meds .
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
## 2020-01-25 NOTE — ED Notes (Signed)
Pt with insulin pump infusing - MD notified via chat and will see pt.
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
## 2020-01-25 NOTE — Progress Notes (Signed)
   01/25/20 2313  Assess: MEWS Score  Temp 99.1 F (37.3 C)  BP (!) 181/105  Pulse Rate 93  SpO2 99 %  Assess: MEWS Score  MEWS Temp 0  MEWS Systolic 0  MEWS Pulse 0  MEWS RR 0  MEWS LOC 0  MEWS Score 0  MEWS Score Color Nyoka Cowden

## 2020-01-25 NOTE — ED Notes (Signed)
Hospitalist at bedside 
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
## 2020-01-26 DIAGNOSIS — T383X5A Adverse effect of insulin and oral hypoglycemic [antidiabetic] drugs, initial encounter: Secondary | ICD-10-CM

## 2020-01-26 DIAGNOSIS — E160 Drug-induced hypoglycemia without coma: Secondary | ICD-10-CM

## 2020-01-26 LAB — BASIC METABOLIC PANEL
Anion gap: 10 (ref 5–15)
BUN: 37 mg/dL — ABNORMAL HIGH (ref 6–20)
CO2: 19 mmol/L — ABNORMAL LOW (ref 22–32)
Calcium: 8 mg/dL — ABNORMAL LOW (ref 8.9–10.3)
Chloride: 106 mmol/L (ref 98–111)
Creatinine, Ser: 4.12 mg/dL — ABNORMAL HIGH (ref 0.44–1.00)
GFR, Estimated: 14 mL/min — ABNORMAL LOW (ref >60)
Glucose, Bld: 132 mg/dL — ABNORMAL HIGH (ref 70–99)
Potassium: 3.7 mmol/L (ref 3.5–5.1)
Sodium: 135 mmol/L (ref 135–145)

## 2020-01-26 LAB — PROTEIN / CREATININE RATIO, URINE
Creatinine, Urine: 62 mg/dL
Protein Creatinine Ratio: 12.13 mg/mg{Cre} — ABNORMAL HIGH (ref 0.00–0.15)
Total Protein, Urine: 752 mg/dL

## 2020-01-26 LAB — HEPATITIS B SURFACE ANTIGEN: Hepatitis B Surface Ag: NONREACTIVE

## 2020-01-26 LAB — HEPATITIS B CORE ANTIBODY, IGM: Hep B C IgM: NONREACTIVE

## 2020-01-26 LAB — VITAMIN B12: Vitamin B-12: 749 pg/mL (ref 180–914)

## 2020-01-26 LAB — URINE CULTURE: Culture: NO GROWTH

## 2020-01-26 LAB — HIV ANTIBODY (ROUTINE TESTING W REFLEX): HIV Screen 4th Generation wRfx: NONREACTIVE

## 2020-01-26 LAB — HEPATITIS C ANTIBODY: HCV Ab: NONREACTIVE

## 2020-01-26 MED ORDER — CLONIDINE HCL 0.1 MG PO TABS
0.1000 mg | ORAL_TABLET | Freq: Two times a day (BID) | ORAL | Status: DC
  Administered 2020-01-26 – 2020-01-27 (×2): 0.1 mg via ORAL
  Filled 2020-01-26 (×2): qty 1

## 2020-01-26 MED ORDER — AMLODIPINE BESYLATE 5 MG PO TABS
5.0000 mg | ORAL_TABLET | Freq: Once | ORAL | Status: AC
  Administered 2020-01-26: 5 mg via ORAL
  Filled 2020-01-26: qty 1

## 2020-01-26 MED ORDER — DILTIAZEM HCL ER COATED BEADS 120 MG PO CP24
240.0000 mg | ORAL_CAPSULE | Freq: Every day | ORAL | Status: DC
  Administered 2020-01-27: 240 mg via ORAL
  Filled 2020-01-26: qty 2

## 2020-01-26 MED ORDER — AMLODIPINE BESYLATE 10 MG PO TABS: 10.0000 mg | ORAL_TABLET | Freq: Every day | ORAL | Status: DC

## 2020-01-26 MED ORDER — CARVEDILOL 25 MG PO TABS
25.0000 mg | ORAL_TABLET | Freq: Two times a day (BID) | ORAL | Status: DC
  Administered 2020-01-26 – 2020-01-27 (×2): 25 mg via ORAL
  Filled 2020-01-26 (×2): qty 1

## 2020-01-26 MED ORDER — MENTHOL 3 MG MT LOZG
1.0000 | LOZENGE | OROMUCOSAL | Status: DC | PRN
  Administered 2020-01-26: 3 mg via ORAL
  Filled 2020-01-26: qty 9

## 2020-01-26 MED ORDER — INSULIN GLARGINE 100 UNIT/ML ~~LOC~~ SOLN
7.0000 [IU] | Freq: Once | SUBCUTANEOUS | Status: AC
  Administered 2020-01-26: 7 [IU] via SUBCUTANEOUS
  Filled 2020-01-26: qty 0.07

## 2020-01-26 MED ORDER — CARVEDILOL 12.5 MG PO TABS
12.5000 mg | ORAL_TABLET | Freq: Once | ORAL | Status: AC
  Administered 2020-01-26: 12.5 mg via ORAL
  Filled 2020-01-26: qty 1
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
## 2020-01-26 NOTE — Plan of Care (Signed)
  Problem: Education: Goal: Knowledge of General Education information will improve Description Including pain rating scale, medication(s)/side effects and non-pharmacologic comfort measures Outcome: Progressing   Problem: Elimination: Goal: Will not experience complications related to urinary retention Outcome: Progressing   Problem: Safety: Goal: Ability to remain free from injury will improve Outcome: Progressing   
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
## 2020-01-27 ENCOUNTER — Other Ambulatory Visit: Payer: Self-pay | Admitting: Endocrinology

## 2020-01-27 DIAGNOSIS — E10319 Type 1 diabetes mellitus with unspecified diabetic retinopathy without macular edema: Secondary | ICD-10-CM

## 2020-01-27 LAB — BASIC METABOLIC PANEL
Anion gap: 9 (ref 5–15)
BUN: 38 mg/dL — ABNORMAL HIGH (ref 6–20)
CO2: 20 mmol/L — ABNORMAL LOW (ref 22–32)
Calcium: 8.2 mg/dL — ABNORMAL LOW (ref 8.9–10.3)
Chloride: 108 mmol/L (ref 98–111)
Creatinine, Ser: 4.26 mg/dL — ABNORMAL HIGH (ref 0.44–1.00)
GFR, Estimated: 14 mL/min — ABNORMAL LOW (ref >60)
Glucose, Bld: 103 mg/dL — ABNORMAL HIGH (ref 70–99)
Potassium: 3.7 mmol/L (ref 3.5–5.1)
Sodium: 137 mmol/L (ref 135–145)

## 2020-01-27 LAB — HEPATITIS B SURFACE ANTIBODY,QUALITATIVE: Hep B S Ab: REACTIVE — AB

## 2020-01-27 LAB — GLUCOSE, CAPILLARY
Glucose-Capillary: 140 mg/dL — ABNORMAL HIGH (ref 70–99)
Glucose-Capillary: 140 mg/dL — ABNORMAL HIGH (ref 70–99)

## 2020-01-27 MED ORDER — CONTINUOUS BLOOD GLUC SENSOR MISC: 1.0000 | 0 refills | Status: DC

## 2020-01-27 MED ORDER — INSULIN PEN NEEDLE 32G X 4 MM MISC: 1.0000 | Freq: Three times a day (TID) | 0 refills | Status: AC

## 2020-01-27 MED ORDER — INSULIN GLARGINE 100 UNIT/ML ~~LOC~~ SOLN
8.0000 [IU] | Freq: Every day | SUBCUTANEOUS | Status: DC
  Administered 2020-01-27: 8 [IU] via SUBCUTANEOUS
  Filled 2020-01-27: qty 0.08

## 2020-01-27 MED ORDER — INSULIN LISPRO (1 UNIT DIAL) 100 UNIT/ML (KWIKPEN): 4.0000 [IU] | PEN_INJECTOR | Freq: Three times a day (TID) | SUBCUTANEOUS | 0 refills | Status: DC

## 2020-01-27 MED ORDER — LANTUS SOLOSTAR 100 UNIT/ML ~~LOC~~ SOPN: 8.0000 [IU] | PEN_INJECTOR | Freq: Every day | SUBCUTANEOUS | 0 refills | Status: DC

## 2020-01-27 MED ORDER — CLONIDINE HCL 0.1 MG PO TABS: 0.1000 mg | ORAL_TABLET | Freq: Two times a day (BID) | ORAL | 0 refills | Status: DC

## 2020-01-27 NOTE — Progress Notes (Signed)
   01/25/20 1815  Assess: MEWS Score  Temp 99.3 F (37.4 C)  BP (!) 201/115  Pulse Rate 88  Resp 18  Level of Consciousness Alert  SpO2 100 %  O2 Device Room Air  Patient Activity (if Appropriate) In bed  Assess: MEWS Score  MEWS Temp 0  MEWS Systolic 2  MEWS Pulse 0  MEWS RR 0  MEWS LOC 0  MEWS Score 2  MEWS Score Color Yellow  Assess: if the MEWS score is Yellow or Red  Were vital signs taken at a resting state? Yes  Focused Assessment No change from prior assessment  Early Detection of Sepsis Score *See Row Information* Low  MEWS guidelines implemented *See Row Information* Yes  Treat  MEWS Interventions Administered prn meds/treatments (See MAR, hydralazine PO)  Pain Scale 0-10  Pain Score 0  Take Vital Signs  Increase Vital Sign Frequency  Yellow: Q 2hr X 2 then Q 4hr X 2, if remains yellow, continue Q 4hrs  Escalate  MEWS: Escalate Yellow: discuss with charge nurse/RN and consider discussing with provider and RRT  Notify: Charge Nurse/RN  Name of Charge Nurse/RN Notified Britt Bolognese  Date Charge Nurse/RN Notified 01/25/20  Time Charge Nurse/RN Notified [place]  Patient Outcome Other (Comment) (continue to monitor)  Inserted for Caryl Ada RN
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
## 2020-01-28 ENCOUNTER — Encounter: Payer: Self-pay | Admitting: Gastroenterology

## 2020-01-29 LAB — PROTEIN ELECTROPHORESIS, SERUM
A/G Ratio: 0.6 — ABNORMAL LOW (ref 0.7–1.7)
Albumin ELP: 2.1 g/dL — ABNORMAL LOW (ref 2.9–4.4)
Alpha-1-Globulin: 0.2 g/dL (ref 0.0–0.4)
Alpha-2-Globulin: 1.8 g/dL — ABNORMAL HIGH (ref 0.4–1.0)
Beta Globulin: 0.7 g/dL (ref 0.7–1.3)
Gamma Globulin: 0.8 g/dL (ref 0.4–1.8)
Globulin, Total: 3.6 g/dL (ref 2.2–3.9)
Total Protein ELP: 5.7 g/dL — ABNORMAL LOW (ref 6.0–8.5)

## 2020-01-29 LAB — CULTURE, BLOOD (SINGLE)
Culture: NO GROWTH
Special Requests: ADEQUATE

## 2020-01-30 LAB — PROTEIN ELECTRO, RANDOM URINE
Albumin ELP, Urine: 58.8 %
Alpha-1-Globulin, U: 6 %
Alpha-2-Globulin, U: 8.3 %
Beta Globulin, U: 12.8 %
Gamma Globulin, U: 14.1 %
Total Protein, Urine: 796.7 mg/dL

## 2020-01-30 LAB — KAPPA/LAMBDA LIGHT CHAINS
Kappa free light chain: 98.1 mg/L — ABNORMAL HIGH (ref 3.3–19.4)
Kappa, lambda light chain ratio: 2.65 — ABNORMAL HIGH (ref 0.26–1.65)
Lambda free light chains: 37 mg/L — ABNORMAL HIGH (ref 5.7–26.3)

## 2020-01-31 ENCOUNTER — Other Ambulatory Visit: Payer: Self-pay

## 2020-01-31 ENCOUNTER — Encounter: Payer: Self-pay | Admitting: Gastroenterology

## 2020-01-31 ENCOUNTER — Ambulatory Visit (AMBULATORY_SURGERY_CENTER): Payer: 59 | Admitting: Gastroenterology

## 2020-01-31 VITALS — BP 147/78 | HR 68 | Temp 98.0°F | Resp 14 | Ht 66.0 in | Wt 179.0 lb

## 2020-01-31 DIAGNOSIS — R112 Nausea with vomiting, unspecified: Secondary | ICD-10-CM

## 2020-01-31 DIAGNOSIS — R634 Abnormal weight loss: Secondary | ICD-10-CM | POA: Diagnosis not present

## 2020-01-31 DIAGNOSIS — R1012 Left upper quadrant pain: Secondary | ICD-10-CM | POA: Diagnosis not present

## 2020-01-31 DIAGNOSIS — K319 Disease of stomach and duodenum, unspecified: Secondary | ICD-10-CM | POA: Diagnosis not present

## 2020-01-31 LAB — PARATHYROID HORMONE, INTACT (NO CA): PTH: 187 pg/mL — ABNORMAL HIGH (ref 15–65)

## 2020-01-31 MED ORDER — PANTOPRAZOLE SODIUM 40 MG PO TBEC: 40.0000 mg | DELAYED_RELEASE_TABLET | Freq: Every day | ORAL | 3 refills | Status: DC

## 2020-01-31 MED ORDER — SODIUM CHLORIDE 0.9 % IV SOLN: 500.0000 mL | Freq: Once | INTRAVENOUS | Status: DC

## 2020-01-31 NOTE — Progress Notes (Signed)
PT taken to PACU. Monitors in place. VSS. Report given to RN. 

## 2020-01-31 NOTE — Progress Notes (Signed)
Called to room to assist during endoscopic procedure.  Patient ID and intended procedure confirmed with present staff. Received instructions for my participation in the procedure from the performing physician.  

## 2020-01-31 NOTE — Progress Notes (Signed)
Medical history reviewed with no changes noted. VS assessed by C.W 

## 2020-01-31 NOTE — Patient Instructions (Signed)
Information on gastritis given to you today.  Await pathology results.  Resume previous diet and medications.  Start pantoprazole 40 mg by mouth once a day.  YOU HAD AN ENDOSCOPIC PROCEDURE TODAY AT [facility]:   Refer to the procedure report that was given to you for any specific questions about what was found during the examination.  If the procedure report does not answer your questions, please call your gastroenterologist to clarify.  If you requested that your care partner not be given the details of your procedure findings, then the procedure report has been included in a sealed envelope for you to review at your convenience later.  YOU SHOULD EXPECT: Some feelings of bloating in the abdomen. Passage of more gas than usual.  Walking can help get rid of the air that was put into your GI tract during the procedure and reduce the bloating. If you had a lower endoscopy (such as a colonoscopy or flexible sigmoidoscopy) you may notice spotting of blood in your stool or on the toilet paper. If you underwent a bowel prep for your procedure, you may not have a normal bowel movement for a few days.  Please Note:  You might notice some irritation and congestion in your nose or some drainage.  This is from the oxygen used during your procedure.  There is no need for concern and it should clear up in a day or so.  SYMPTOMS TO REPORT IMMEDIATELY:    Following upper endoscopy (EGD)  Vomiting of blood or coffee ground material  New chest pain or pain under the shoulder blades  Painful or persistently difficult swallowing  New shortness of breath  Fever of 100F or higher  Black, tarry-looking stools  For urgent or emergent issues, a gastroenterologist can be reached at any hour by calling [phone]. Do not use MyChart messaging for urgent concerns.    DIET:  We do recommend a small meal at first, but then you may proceed to your regular diet.  Drink plenty of fluids but you  should avoid alcoholic beverages for 24 hours.  ACTIVITY:  You should plan to take it easy for the rest of today and you should NOT DRIVE or use heavy machinery until tomorrow (because of the sedation medicines used during the test).    FOLLOW UP: Our staff will call the number listed on your records 48-72 hours following your procedure to check on you and address any questions or concerns that you may have regarding the information given to you following your procedure. If we do not reach you, we will leave a message.  We will attempt to reach you two times.  During this call, we will ask if you have developed any symptoms of COVID 19. If you develop any symptoms (ie: fever, flu-like symptoms, shortness of breath, cough etc.) before then, please call [phone].  If you test positive for Covid 19 in the 2 weeks post procedure, please call and report this information to [name].    If any biopsies were taken you will be contacted by phone or by letter within the next 1-3 weeks.  Please call us at [phone] if you have not heard about the biopsies in 3 weeks.    SIGNATURES/CONFIDENTIALITY: You and/or your care partner have signed paperwork which will be entered into your electronic medical record.  These signatures attest to the fact that that the information above on your After Visit Summary has been reviewed and is understood.  Full responsibility of the confidentiality of this discharge information lies with you and/or your care-partner.

## 2020-01-31 NOTE — Op Note (Signed)
[place] Patient Name: Barbara Donovan Procedure Date: 01/31/2020 2:52 PM MRN: [id number] Endoscopist: Ladene Artist , MD Age: 30 Referring MD:  Date of Birth: [birth year] Gender: Female Account #: [id number] Procedure:                Upper GI endoscopy Indications:              Abdominal pain in the left upper quadrant, Nausea                            with vomiting, Weight loss Medicines:                Monitored Anesthesia Care Procedure:                Pre-Anesthesia Assessment:                           - Prior to the procedure, a History and Physical                            was performed, and patient medications and                            allergies were reviewed. The patient's tolerance of                            previous anesthesia was also reviewed. The risks                            and benefits of the procedure and the sedation                            options and risks were discussed with the patient.                            All questions were answered, and informed consent                            was obtained. Prior Anticoagulants: The patient has                            taken no previous anticoagulant or antiplatelet                            agents. ASA Grade Assessment: II - A patient with                            mild systemic disease. After reviewing the risks                            and benefits, the patient was deemed in                            satisfactory condition to undergo the procedure.  After obtaining informed consent, the endoscope was                            passed under direct vision. Throughout the                            procedure, the patient's blood pressure, pulse, and                            oxygen saturations were monitored continuously. The                            Endoscope was introduced through the mouth, and                            advanced to the second part  of duodenum. The upper                            GI endoscopy was accomplished without difficulty.                            The patient tolerated the procedure well. Sensntive                            gag reflex noted. Scope In: Scope Out: Findings:                 The examined esophagus was normal.                           Localized moderately erythematous, congested mucosa                            without bleeding was found in the gastric fundus.                            Findings typical for vomiting induced gastropathy.                            Biopsies were taken with a cold forceps for                            histology.                           The exam of the stomach was otherwise normal.                           The duodenal bulb and second portion of the                            duodenum were normal. Complications:            No immediate complications. Estimated Blood Loss:     Estimated blood loss was minimal. Impression:               -  Normal esophagus.                           - Focal erythematous mucosa in the gastric fundus.                            Biopsied.                           - Normal duodenal bulb and second portion of the                            duodenum. Recommendation:           - Patient has a contact number available for                            emergencies. The signs and symptoms of potential                            delayed complications were discussed with the                            patient. Return to normal activities tomorrow.                            Written discharge instructions were provided to the                            patient.                           - Resume previous diet.                           - Continue present medications.                           - Pantoprazole 40 mg po qd, 1 year of refills.                           - Await pathology results. Ladene Artist, MD 01/31/2020 3:54:37 PM This  report has been signed electronically.

## 2020-02-05 ENCOUNTER — Telehealth: Payer: Self-pay

## 2020-02-05 NOTE — Telephone Encounter (Signed)
Attempted to reach pt. With follow-up call following endoscopic procedure 01/31/2020.  LM on pt. Voice mail.  Will try to reach pt. Again later today.

## 2020-02-07 ENCOUNTER — Encounter: Payer: Self-pay | Admitting: Gastroenterology

## 2020-02-09 ENCOUNTER — Encounter (HOSPITAL_COMMUNITY): Payer: Self-pay | Admitting: Emergency Medicine

## 2020-02-09 ENCOUNTER — Observation Stay (HOSPITAL_COMMUNITY)
Admission: EM | Admit: 2020-02-09 | Discharge: 2020-02-10 | Disposition: A | Discharge status: Home / Self Care | Payer: 59 | Attending: Student | Admitting: Student

## 2020-02-09 ENCOUNTER — Other Ambulatory Visit: Payer: Self-pay

## 2020-02-09 DIAGNOSIS — Z20822 Contact with and (suspected) exposure to covid-19: Secondary | ICD-10-CM | POA: Insufficient documentation

## 2020-02-09 DIAGNOSIS — I120 Hypertensive chronic kidney disease with stage 5 chronic kidney disease or end stage renal disease: Secondary | ICD-10-CM | POA: Diagnosis not present

## 2020-02-09 DIAGNOSIS — R112 Nausea with vomiting, unspecified: Secondary | ICD-10-CM

## 2020-02-09 DIAGNOSIS — N185 Chronic kidney disease, stage 5: Secondary | ICD-10-CM | POA: Insufficient documentation

## 2020-02-09 DIAGNOSIS — E1110 Type 2 diabetes mellitus with ketoacidosis without coma: Secondary | ICD-10-CM | POA: Diagnosis present

## 2020-02-09 DIAGNOSIS — E1022 Type 1 diabetes mellitus with diabetic chronic kidney disease: Secondary | ICD-10-CM | POA: Diagnosis not present

## 2020-02-09 DIAGNOSIS — E876 Hypokalemia: Secondary | ICD-10-CM | POA: Diagnosis not present

## 2020-02-09 DIAGNOSIS — E1010 Type 1 diabetes mellitus with ketoacidosis without coma: Principal | ICD-10-CM | POA: Insufficient documentation

## 2020-02-09 DIAGNOSIS — I160 Hypertensive urgency: Secondary | ICD-10-CM | POA: Insufficient documentation

## 2020-02-09 DIAGNOSIS — I10 Essential (primary) hypertension: Secondary | ICD-10-CM

## 2020-02-09 LAB — BASIC METABOLIC PANEL
Anion gap: 18 — ABNORMAL HIGH (ref 5–15)
Anion gap: 17 — ABNORMAL HIGH (ref 5–15)
Anion gap: 7 (ref 5–15)
Anion gap: 11 (ref 5–15)
BUN: 33 mg/dL — ABNORMAL HIGH (ref 6–20)
BUN: 32 mg/dL — ABNORMAL HIGH (ref 6–20)
BUN: 32 mg/dL — ABNORMAL HIGH (ref 6–20)
BUN: 31 mg/dL — ABNORMAL HIGH (ref 6–20)
CO2: 16 mmol/L — ABNORMAL LOW (ref 22–32)
CO2: 15 mmol/L — ABNORMAL LOW (ref 22–32)
CO2: 22 mmol/L (ref 22–32)
CO2: 20 mmol/L — ABNORMAL LOW (ref 22–32)
Calcium: 9.1 mg/dL (ref 8.9–10.3)
Calcium: 8.7 mg/dL — ABNORMAL LOW (ref 8.9–10.3)
Calcium: 8.3 mg/dL — ABNORMAL LOW (ref 8.9–10.3)
Calcium: 7.9 mg/dL — ABNORMAL LOW (ref 8.9–10.3)
Chloride: 104 mmol/L (ref 98–111)
Chloride: 107 mmol/L (ref 98–111)
Chloride: 109 mmol/L (ref 98–111)
Chloride: 105 mmol/L (ref 98–111)
Creatinine, Ser: 4.25 mg/dL — ABNORMAL HIGH (ref 0.44–1.00)
Creatinine, Ser: 4.11 mg/dL — ABNORMAL HIGH (ref 0.44–1.00)
Creatinine, Ser: 4.73 mg/dL — ABNORMAL HIGH (ref 0.44–1.00)
Creatinine, Ser: 4.49 mg/dL — ABNORMAL HIGH (ref 0.44–1.00)
GFR, Estimated: 14 mL/min — ABNORMAL LOW (ref >60)
GFR, Estimated: 14 mL/min — ABNORMAL LOW (ref >60)
GFR, Estimated: 12 mL/min — ABNORMAL LOW (ref >60)
GFR, Estimated: 13 mL/min — ABNORMAL LOW (ref >60)
Glucose, Bld: 324 mg/dL — ABNORMAL HIGH (ref 70–99)
Glucose, Bld: 203 mg/dL — ABNORMAL HIGH (ref 70–99)
Glucose, Bld: 102 mg/dL — ABNORMAL HIGH (ref 70–99)
Glucose, Bld: 191 mg/dL — ABNORMAL HIGH (ref 70–99)
Potassium: 3.8 mmol/L (ref 3.5–5.1)
Potassium: 4.5 mmol/L (ref 3.5–5.1)
Potassium: 3.5 mmol/L (ref 3.5–5.1)
Potassium: 3.2 mmol/L — ABNORMAL LOW (ref 3.5–5.1)
Sodium: 138 mmol/L (ref 135–145)
Sodium: 139 mmol/L (ref 135–145)
Sodium: 138 mmol/L (ref 135–145)
Sodium: 136 mmol/L (ref 135–145)

## 2020-02-09 LAB — CBC WITH DIFFERENTIAL/PLATELET
Abs Immature Granulocytes: 0.04 10*3/uL (ref 0.00–0.07)
Basophils Absolute: 0 10*3/uL (ref 0.0–0.1)
Basophils Relative: 0 %
Eosinophils Absolute: 0 10*3/uL (ref 0.0–0.5)
Eosinophils Relative: 0 %
HCT: 39.2 % (ref 36.0–46.0)
Hemoglobin: 12.4 g/dL (ref 12.0–15.0)
Immature Granulocytes: 0 %
Lymphocytes Relative: 7 %
Lymphs Abs: 0.7 10*3/uL (ref 0.7–4.0)
MCH: 25.6 pg — ABNORMAL LOW (ref 26.0–34.0)
MCHC: 31.6 g/dL (ref 30.0–36.0)
MCV: 81 fL (ref 80.0–100.0)
Monocytes Absolute: 0.1 10*3/uL (ref 0.1–1.0)
Monocytes Relative: 1 %
Neutro Abs: 8.9 10*3/uL — ABNORMAL HIGH (ref 1.7–7.7)
Neutrophils Relative %: 92 %
Platelets: 512 10*3/uL — ABNORMAL HIGH (ref 150–400)
RBC: 4.84 MIL/uL (ref 3.87–5.11)
RDW: 14.6 % (ref 11.5–15.5)
WBC: 9.7 10*3/uL (ref 4.0–10.5)
nRBC: 0 % (ref 0.0–0.2)

## 2020-02-09 LAB — CBG MONITORING, ED
Glucose-Capillary: 302 mg/dL — ABNORMAL HIGH (ref 70–99)
Glucose-Capillary: 278 mg/dL — ABNORMAL HIGH (ref 70–99)
Glucose-Capillary: 246 mg/dL — ABNORMAL HIGH (ref 70–99)
Glucose-Capillary: 196 mg/dL — ABNORMAL HIGH (ref 70–99)
Glucose-Capillary: 173 mg/dL — ABNORMAL HIGH (ref 70–99)
Glucose-Capillary: 163 mg/dL — ABNORMAL HIGH (ref 70–99)
Glucose-Capillary: 138 mg/dL — ABNORMAL HIGH (ref 70–99)
Glucose-Capillary: 119 mg/dL — ABNORMAL HIGH (ref 70–99)
Glucose-Capillary: 105 mg/dL — ABNORMAL HIGH (ref 70–99)
Glucose-Capillary: 91 mg/dL (ref 70–99)
Glucose-Capillary: 54 mg/dL — ABNORMAL LOW (ref 70–99)
Glucose-Capillary: 57 mg/dL — ABNORMAL LOW (ref 70–99)
Glucose-Capillary: 119 mg/dL — ABNORMAL HIGH (ref 70–99)

## 2020-02-09 LAB — BLOOD GAS, VENOUS
Acid-base deficit: 8.8 mmol/L — ABNORMAL HIGH (ref 0.0–2.0)
Bicarbonate: 16.4 mmol/L — ABNORMAL LOW (ref 20.0–28.0)
FIO2: 21
O2 Saturation: 84.8 %
Patient temperature: 98.6
pCO2, Ven: 34.4 mmHg — ABNORMAL LOW (ref 44.0–60.0)
pH, Ven: 7.299 (ref 7.250–7.430)
pO2, Ven: 57 mmHg — ABNORMAL HIGH (ref 32.0–45.0)

## 2020-02-09 LAB — URINALYSIS, ROUTINE W REFLEX MICROSCOPIC
Bilirubin Urine: NEGATIVE
Glucose, UA: >=500 mg/dL — AB
Ketones, ur: 20 mg/dL — AB
Nitrite: NEGATIVE
Protein, ur: >=300 mg/dL — AB
Specific Gravity, Urine: 1.012 (ref 1.005–1.030)
pH: 7 (ref 5.0–8.0)

## 2020-02-09 LAB — I-STAT BETA HCG BLOOD, ED (MC, WL, AP ONLY): I-stat hCG, quantitative: <5 m[IU]/mL (ref <5)

## 2020-02-09 LAB — HEMOGLOBIN A1C
Hgb A1c MFr Bld: 6.9 % — ABNORMAL HIGH (ref 4.8–5.6)
Mean Plasma Glucose: 151.33 mg/dL

## 2020-02-09 LAB — BETA-HYDROXYBUTYRIC ACID
Beta-Hydroxybutyric Acid: 4.39 mmol/L — ABNORMAL HIGH (ref 0.05–0.27)
Beta-Hydroxybutyric Acid: 0.16 mmol/L (ref 0.05–0.27)
Beta-Hydroxybutyric Acid: 0.31 mmol/L — ABNORMAL HIGH (ref 0.05–0.27)

## 2020-02-09 LAB — SARS CORONAVIRUS 2 (TAT 6-24 HRS): SARS Coronavirus 2: NEGATIVE

## 2020-02-09 LAB — GLUCOSE, CAPILLARY: Glucose-Capillary: 177 mg/dL — ABNORMAL HIGH (ref 70–99)

## 2020-02-09 LAB — LIPASE, BLOOD: Lipase: 22 U/L (ref 11–51)

## 2020-02-09 MED ORDER — DEXTROSE 50 % IV SOLN: 0.0000 mL | INTRAVENOUS | Status: DC | PRN

## 2020-02-09 MED ORDER — PROCHLORPERAZINE EDISYLATE 10 MG/2ML IJ SOLN: 10.0000 mg | Freq: Four times a day (QID) | INTRAMUSCULAR | Status: DC | PRN

## 2020-02-09 MED ORDER — CARVEDILOL 12.5 MG PO TABS
25.0000 mg | ORAL_TABLET | Freq: Two times a day (BID) | ORAL | Status: DC
  Administered 2020-02-09: 25 mg via ORAL
  Filled 2020-02-09: qty 2

## 2020-02-09 MED ORDER — INSULIN ASPART 100 UNIT/ML ~~LOC~~ SOLN
3.0000 [IU] | Freq: Three times a day (TID) | SUBCUTANEOUS | Status: DC
  Administered 2020-02-09 – 2020-02-10 (×4): 3 [IU] via SUBCUTANEOUS
  Filled 2020-02-09: qty 0.03

## 2020-02-09 MED ORDER — INSULIN ASPART 100 UNIT/ML ~~LOC~~ SOLN
0.0000 [IU] | Freq: Three times a day (TID) | SUBCUTANEOUS | Status: DC
  Administered 2020-02-10: 6 [IU] via SUBCUTANEOUS
  Filled 2020-02-09: qty 0.15

## 2020-02-09 MED ORDER — INSULIN GLARGINE 100 UNIT/ML ~~LOC~~ SOLN
8.0000 [IU] | Freq: Every day | SUBCUTANEOUS | Status: DC
  Administered 2020-02-09 – 2020-02-10 (×2): 8 [IU] via SUBCUTANEOUS
  Filled 2020-02-09 (×2): qty 0.08

## 2020-02-09 MED ORDER — CLONIDINE HCL 0.1 MG PO TABS
0.1000 mg | ORAL_TABLET | Freq: Two times a day (BID) | ORAL | Status: DC
  Administered 2020-02-09 – 2020-02-10 (×3): 0.1 mg via ORAL
  Filled 2020-02-09 (×3): qty 1

## 2020-02-09 MED ORDER — LACTATED RINGERS IV SOLN: INTRAVENOUS | Status: DC

## 2020-02-09 MED ORDER — ONDANSETRON HCL 4 MG/2ML IJ SOLN
4.0000 mg | Freq: Once | INTRAMUSCULAR | Status: AC
  Administered 2020-02-09: 4 mg via INTRAVENOUS
  Filled 2020-02-09: qty 2

## 2020-02-09 MED ORDER — DILTIAZEM HCL ER COATED BEADS 240 MG PO CP24
240.0000 mg | ORAL_CAPSULE | Freq: Every day | ORAL | Status: DC
  Administered 2020-02-10: 240 mg via ORAL
  Filled 2020-02-09: qty 2
  Filled 2020-02-09: qty 1

## 2020-02-09 MED ORDER — CARVEDILOL 25 MG PO TABS
25.0000 mg | ORAL_TABLET | Freq: Two times a day (BID) | ORAL | Status: DC
Start: 2020-02-09 — End: 2020-02-10
  Administered 2020-02-09 – 2020-02-10 (×2): 25 mg via ORAL
  Filled 2020-02-09: qty 1
  Filled 2020-02-09: qty 2
  Filled 2020-02-09: qty 1

## 2020-02-09 MED ORDER — INSULIN ASPART 100 UNIT/ML ~~LOC~~ SOLN
0.0000 [IU] | Freq: Every day | SUBCUTANEOUS | Status: DC
  Filled 2020-02-09: qty 0.05

## 2020-02-09 MED ORDER — LACTATED RINGERS IV BOLUS
20.0000 mL/kg | Freq: Once | INTRAVENOUS | Status: AC
Start: 2020-02-09 — End: 2020-02-09
  Administered 2020-02-09: 1632 mL via INTRAVENOUS

## 2020-02-09 MED ORDER — DILTIAZEM HCL ER COATED BEADS 120 MG PO CP24
240.0000 mg | ORAL_CAPSULE | Freq: Every day | ORAL | Status: DC
  Administered 2020-02-09: 240 mg via ORAL
  Filled 2020-02-09: qty 2

## 2020-02-09 MED ORDER — INSULIN REGULAR(HUMAN) IN NACL 100-0.9 UT/100ML-% IV SOLN
INTRAVENOUS | Status: DC
  Administered 2020-02-09: 6 [IU]/h via INTRAVENOUS
  Filled 2020-02-09: qty 100

## 2020-02-09 MED ORDER — CARVEDILOL 12.5 MG PO TABS: 25.0000 mg | ORAL_TABLET | Freq: Two times a day (BID) | ORAL | Status: DC

## 2020-02-09 MED ORDER — POTASSIUM CHLORIDE 10 MEQ/100ML IV SOLN
10.0000 meq | INTRAVENOUS | Status: AC
  Administered 2020-02-09 (×2): 10 meq via INTRAVENOUS
  Filled 2020-02-09 (×2): qty 100

## 2020-02-09 MED ORDER — DEXTROSE IN LACTATED RINGERS 5 % IV SOLN: INTRAVENOUS | Status: DC

## 2020-02-09 MED ORDER — HYDRALAZINE HCL 50 MG PO TABS
50.0000 mg | ORAL_TABLET | Freq: Three times a day (TID) | ORAL | Status: DC
  Administered 2020-02-09 – 2020-02-10 (×5): 50 mg via ORAL
  Filled 2020-02-09 (×2): qty 2
  Filled 2020-02-09 (×3): qty 1

## 2020-02-09 MED ORDER — PROMETHAZINE HCL 25 MG/ML IJ SOLN
12.5000 mg | Freq: Once | INTRAMUSCULAR | Status: AC
  Administered 2020-02-09: 12.5 mg via INTRAVENOUS
  Filled 2020-02-09: qty 1

## 2020-02-09 MED ORDER — NICARDIPINE HCL IN NACL 20-0.86 MG/200ML-% IV SOLN
3.0000 mg/h | INTRAVENOUS | Status: DC
  Filled 2020-02-09: qty 200
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
## 2020-02-09 NOTE — ED Notes (Addendum)
RN unsuccessful with 2 attempts at starting IV. Another RN at bedside. IV team consulted.

## 2020-02-09 NOTE — ED Triage Notes (Signed)
Pt reports nausea and vomiting starting yesterday. She has a hx of the same. She is a type 1 diabetic and states that she has been unable to take her medications. Also reports body aches and chills.

## 2020-02-09 NOTE — ED Notes (Signed)
Report called to Lavonna Monarch, RN on 4E

## 2020-02-09 NOTE — H&P (Signed)
History and Physical    Barbara Donovan [id number] DOB: March 14, 1990 DOA: 02/09/2020  PCP: Fanny Bien, MD  Patient coming from: Home  Chief Complaint: N/V  HPI: Barbara Donovan is a 30 y.o. female with medical history significant of DMt1, CKD5, HTN. Presenting with N/V. Started yesterday. Unable to keep anything down (food, drink or meds). Tried phenergan to help, but couldn't keep it down. She denies any other symptoms or sick contacts. She became concerned and came to the ED. She denies any other alleviating or aggravating factors.    ED Course: She was found to be in DKA. She was started on insulin gtt. She was found to be hypertensive. She was started on some of her home meds. [facility] was called for admission.   Review of Systems:  Denies CP, palpitations, F, diarrhea, urinary changes. Review of systems is otherwise negative for all not mentioned in HPI.   PMHx Past Medical History:  Diagnosis Date  . Asthma    as a child, no problems as an adult, no inhaler  . Cataract    NS OU  . Chronic hypertension during pregnancy, antepartum 08/19/2017  . Dehydration 01/28/2018  . Depression during pregnancy, antepartum 07/07/2017   6/20: Short trial of zoloft previously, reports didn't help much but also didn't give it a chance Discussed r/b/a SSRIs in pregnancy, agrees to try Zoloft again, rx sent No SI/HI/red flags  . Diabetes ([place])    TYPE I  . 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  Marland Kitchen HTN (hypertension)   . Hypertensive retinopathy    OU  . Hypokalemia 01/22/2018  . Hypomagnesemia 01/28/2018  . Intractable nausea and vomiting 01/22/2018  . Intrauterine growth restriction (IUGR) affecting care of mother 12/22/2017  . Morbid obesity ([place])   . 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 Proteinuria due to type 1 diabetes mellitus ([place]) 11/02/2017   Baseline Pr: Cr 10.23  . Severe hyperemesis  gravidarum 10/30/2017  . Type I diabetes mellitus ([place]) 07/07/2017   Current Diabetic Medications:  Insulin  '[x]'$  Aspirin 81 mg daily after 12 weeks (? A2/B GDM)  Required Referrals for A1GDM or A2GDM: '[x]'$  Diabetes Education and Testing Supplies '[x]'$  Nutrition Cousult  For A2/B GDM or higher classes of DM '[x]'$  Diabetes Education and Testing Supplies '[x]'$  Nutrition Counsult '[x]'$  Fetal ECHO after 20 weeks  '[x]'$  Eye exam for retina evaluation - severe retinopathy 7/19  Base  . Ventricular septal defect (VSD) of fetus in singleton pregnancy, antepartum 09/30/2017   May go to newborn nursery per Dr. Lenard Simmer Echo prior to discharge    PSHx Past Surgical History:  Procedure Laterality Date  . [place] VITRECTOMY WITH 20 GAUGE MVR PORT FOR MACULAR HOLE Left 07/20/2018   Procedure: 25 GAUGE PARS PLANA VITRECTOMY LEFT EYE;  Surgeon: Bernarda Caffey, MD;  Location: [facility];  Service: Ophthalmology;  Laterality: Left;  . CESAREAN SECTION N/A 12/26/2017   Procedure: CESAREAN SECTION;  Surgeon: Osborne Oman, MD;  Location: [facility];  Service: Obstetrics;  Laterality: N/A;  . EYE EXAMINATION UNDER ANESTHESIA Right 07/20/2018   Procedure: Eye Exam Under Anesthesia RIGHT EYE;  Surgeon: Bernarda Caffey, MD;  Location: [place];  Service: Ophthalmology;  Laterality: Right;  . EYE SURGERY Left 07/2018  . GAS INSERTION Left 07/19/2019   Procedure: INSERTION OF GAS;  Surgeon: Bernarda Caffey, MD;  Location: [facility];  Service: Ophthalmology;  Laterality: Left;  SF6  .  INJECTION OF SILICONE OIL Left 99991111   Procedure: Injection Of Silicone Oil LEFT EYE;  Surgeon: Bernarda Caffey, MD;  Location: [place];  Service: Ophthalmology;  Laterality: Left;  . LASER PHOTO ABLATION Right 07/20/2018   Procedure: Laser Photo Ablation RIGHT EYE;  Surgeon: Bernarda Caffey, MD;  Location: [place];  Service: Ophthalmology;  Laterality: Right;  . MEMBRANE PEEL Left 07/20/2018   Procedure: Membrane Peel LEFT EYE;  Surgeon: Bernarda Caffey, MD;   Location: [facility];  Service: Ophthalmology;  Laterality: Left;  . MEMBRANE PEEL Left 07/19/2019   Procedure: MEMBRANE PEEL;  Surgeon: Bernarda Caffey, MD;  Location: [place];  Service: Ophthalmology;  Laterality: Left;  . MITOMYCIN C APPLICATION Bilateral 99991111   Procedure: Avastin Application;  Surgeon: Bernarda Caffey, MD;  Location: [place];  Service: Ophthalmology;  Laterality: Bilateral;  . PHOTOCOAGULATION WITH LASER Left 07/20/2018   Procedure: Photocoagulation With Laser LEFT EYE;  Surgeon: Bernarda Caffey, MD;  Location: [place];  Service: Ophthalmology;  Laterality: Left;  . PHOTOCOAGULATION WITH LASER Left 07/19/2019   Procedure: PHOTOCOAGULATION WITH LASER;  Surgeon: Bernarda Caffey, MD;  Location: [place];  Service: Ophthalmology;  Laterality: Left;  . RETINAL DETACHMENT SURGERY Left 07/20/2018   Dr. Bernarda Caffey  . SILICON OIL REMOVAL Left 07/19/2019   Procedure: 25g PARS PLANA VITRECTOMY WITH SILICON OIL REMOVAL;  Surgeon: Bernarda Caffey, MD;  Location: [place];  Service: Ophthalmology;  Laterality: Left;  . WISDOM TOOTH EXTRACTION      SocHx  reports that she has never smoked. She has never used smokeless tobacco. She reports previous alcohol use. She reports that she does not use drugs.  No Known Allergies  FamHx Family History  Problem Relation Age of Onset  . Diabetes Mother   . Aneurysm Mother 72  . Seizures Mother   . Diabetes Father   . Cataracts Father   . COPD Father   . Heart attack Father   . Heart disease Father   . Healthy Sister   . Healthy Daughter   . Stroke Maternal Grandfather   . Amblyopia Neg Hx   . Blindness Neg Hx   . Glaucoma Neg Hx   . Macular degeneration Neg Hx   . Retinal detachment Neg Hx   . Strabismus Neg Hx   . Retinitis pigmentosa Neg Hx   . Colon cancer Neg Hx   . Stomach cancer Neg Hx   . Esophageal cancer Neg Hx   . Pancreatic cancer Neg Hx   . Liver disease Neg Hx     Prior to Admission medications   Medication Sig Start Date End Date Taking?  Authorizing Provider  carvedilol (COREG) 25 MG tablet Take 1 tablet (25 mg total) by mouth 2 (two) times daily. 03/07/19   Imogene Burn, PA-C  cloNIDine (CATAPRES) 0.1 MG tablet Take 1 tablet (0.1 mg total) by mouth 2 (two) times daily. Hold for sbp < 110 or hr < 60/min 01/27/20 02/26/20  Antonieta Pert, MD  Continuous Blood Gluc Sensor MISC 1 each by Does not apply route as directed. Use as directed every 14 days. May dispense FreeStyle [employer] or similar. 01/27/20   Antonieta Pert, MD  diltiazem (CARDIZEM CD) 240 MG 24 hr capsule Take 1 capsule (240 mg total) by mouth daily. 03/08/19   Imogene Burn, PA-C  hydrALAZINE (APRESOLINE) 50 MG tablet Take 50 mg by mouth 3 (three) times daily. 12/27/19   [provider]  insulin glargine (LANTUS SOLOSTAR) 100 UNIT/ML Solostar  Pen Inject 8 Units into the skin daily. 01/27/20   Antonieta Pert, MD  insulin lispro (HUMALOG KWIKPEN) 100 UNIT/ML KwikPen Inject 4 Units into the skin 3 (three) times daily for 25 days. 01/27/20 02/21/20  Antonieta Pert, MD  Insulin Pen Needle 32G X 4 MM MISC 1 each by Does not apply route 3 (three) times daily for 100 doses. 01/27/20 03/01/20  Antonieta Pert, MD  metoCLOPramide (REGLAN) 5 MG tablet Take 5 mg by mouth 4 (four) times daily as needed for nausea. Patient not taking: Reported on 01/31/2020 01/20/20 02/03/20  [provider]  ondansetron (ZOFRAN ODT) 4 MG disintegrating tablet Take 1 tablet (4 mg total) by mouth every 8 (eight) hours as needed for nausea or vomiting. Patient not taking: Reported on 01/31/2020 12/27/19   Delia Heady, PA-C  pantoprazole (PROTONIX) 40 MG tablet Take 1 tablet (40 mg total) by mouth daily. 01/31/20   Ladene Artist, MD  promethazine (PHENERGAN) 25 MG tablet Take 1 tablet (25 mg total) by mouth every 6 (six) hours as needed for nausea or vomiting. Patient not taking: Reported on 01/31/2020 11/28/19   Ladene Artist, MD  rosuvastatin (CRESTOR) 5 MG tablet Take 5 mg by mouth daily. Patient not  taking: Reported on 01/31/2020    [provider]  sertraline (ZOLOFT) 50 MG tablet Take 50 mg by mouth daily. 01/15/20   [provider]  sodium bicarbonate 650 MG tablet Take 650 mg by mouth 3 (three) times daily. 01/20/20 02/19/20  [provider]    Physical Exam: Vitals:   02/09/20 0630 02/09/20 0645 02/09/20 0700 02/09/20 0730  BP: (!) 212/135 (!) 212/135 (!) 213/122 (!) 223/126  Pulse: (!) 114  (!) 105 97  Resp: '13  15 19  '$ Temp:      TempSrc:      SpO2: 100%  100% 100%  Weight:      Height:        General: 30 y.o. female resting in bed in NAD Eyes: PERRL, normal sclera ENMT: Nares patent w/o discharge, orophaynx clear, dentition normal, ears w/o discharge/lesions/ulcers Neck: Supple, trachea midline Cardiovascular: RRR, +S1, S2, no m/g/r, equal pulses throughout Respiratory: CTABL, no w/r/r, normal WOB GI: BS+, ND, LUQ TTP, no masses noted, no organomegaly noted MSK: No e/c/c Skin: No rashes, bruises, ulcerations noted Neuro: A&O x 3, no focal deficits Psyc: Appropriate interaction and affect, calm/cooperative  Labs on Admission: I have personally reviewed following labs and imaging studies  CBC: Recent Labs  Lab 02/09/20 0408  WBC 9.7  NEUTROABS 8.9*  HGB 12.4  HCT 39.2  MCV 81.0  PLT [id number]*   Basic Metabolic Panel: Recent Labs  Lab 02/09/20 0408  NA 138  K 3.8  CL 104  CO2 16*  GLUCOSE 324*  BUN 33*  CREATININE 4.25*  CALCIUM 9.1   GFR: Estimated Creatinine Clearance: 21 mL/min (A) (by C-G formula based on SCr of 4.25 mg/dL (H)). Liver Function Tests: No results for input(s): AST, ALT, ALKPHOS, BILITOT, PROT, ALBUMIN in the last 168 hours. No results for input(s): LIPASE, AMYLASE in the last 168 hours. No results for input(s): AMMONIA in the last 168 hours. Coagulation Profile: No results for input(s): INR, PROTIME in the last 168 hours. Cardiac Enzymes: No results for input(s): CKTOTAL, CKMB, CKMBINDEX, TROPONINI in the  last 168 hours. BNP (last 3 results) No results for input(s): PROBNP in the last 8760 hours. HbA1C: No results for input(s): HGBA1C in the last 72 hours. CBG: Recent  Labs  Lab 02/09/20 0405 02/09/20 0559 02/09/20 0702  GLUCAP 302* 278* 246*   Lipid Profile: No results for input(s): CHOL, HDL, LDLCALC, TRIG, CHOLHDL, LDLDIRECT in the last 72 hours. Thyroid Function Tests: No results for input(s): TSH, T4TOTAL, FREET4, T3FREE, THYROIDAB in the last 72 hours. Anemia Panel: No results for input(s): VITAMINB12, FOLATE, FERRITIN, TIBC, IRON, RETICCTPCT in the last 72 hours. Urine analysis:    Component Value Date/Time   COLORURINE STRAW (A) 01/24/2020 2055   APPEARANCEUR CLEAR (A) 01/24/2020 2055   LABSPEC 1.009 01/24/2020 2055   PHURINE 6.0 01/24/2020 2055   GLUCOSEU >=500 (A) 01/24/2020 2055   HGBUR NEGATIVE 01/24/2020 2055   BILIRUBINUR NEGATIVE 01/24/2020 2055   KETONESUR NEGATIVE 01/24/2020 2055   PROTEINUR >=300 (A) 01/24/2020 2055   UROBILINOGEN 0.2 08/14/2019 1051   NITRITE NEGATIVE 01/24/2020 2055   LEUKOCYTESUR TRACE (A) 01/24/2020 2055    Radiological Exams on Admission: No results found.  Assessment/Plan DKA IDDM     - admit to inpt, SDU     - endotool: insulin gtt, fluids     - A1c ordered  HTN urgency     - resume home meds  CKD5     - at baseline, watch nephro toxins  HLD     - continue statin when nausea controlled  Anxiety     - continue home meds when nausea controlled  DVT prophylaxis: lovenox  Code Status: FULL  Family Communication: None at bedside.  Consults called: None   Status is: Inpatient  Remains inpatient appropriate because:Inpatient level of care appropriate due to severity of illness   Dispo: The patient is from: Home              Anticipated d/c is to: Home              Anticipated d/c date is: 2 days              Patient currently is not medically stable to d/c.   Difficult to place patient No  Jonnie Finner  DO [facility]  If 7PM-7AM, please contact night-coverage [email or web address]  02/09/2020, 8:08 AM

## 2020-02-10 DIAGNOSIS — E876 Hypokalemia: Secondary | ICD-10-CM | POA: Diagnosis not present

## 2020-02-10 DIAGNOSIS — D631 Anemia in chronic kidney disease: Secondary | ICD-10-CM

## 2020-02-10 DIAGNOSIS — E1010 Type 1 diabetes mellitus with ketoacidosis without coma: Secondary | ICD-10-CM | POA: Diagnosis not present

## 2020-02-10 DIAGNOSIS — I160 Hypertensive urgency: Secondary | ICD-10-CM

## 2020-02-10 DIAGNOSIS — N185 Chronic kidney disease, stage 5: Secondary | ICD-10-CM

## 2020-02-10 LAB — BASIC METABOLIC PANEL
Anion gap: 6 (ref 5–15)
BUN: 25 mg/dL — ABNORMAL HIGH (ref 6–20)
CO2: 22 mmol/L (ref 22–32)
Calcium: 7.6 mg/dL — ABNORMAL LOW (ref 8.9–10.3)
Chloride: 107 mmol/L (ref 98–111)
Creatinine, Ser: 4.87 mg/dL — ABNORMAL HIGH (ref 0.44–1.00)
GFR, Estimated: 12 mL/min — ABNORMAL LOW (ref >60)
Glucose, Bld: 165 mg/dL — ABNORMAL HIGH (ref 70–99)
Potassium: 3.2 mmol/L — ABNORMAL LOW (ref 3.5–5.1)
Sodium: 135 mmol/L (ref 135–145)

## 2020-02-10 LAB — CBC
HCT: 30 % — ABNORMAL LOW (ref 36.0–46.0)
Hemoglobin: 9.4 g/dL — ABNORMAL LOW (ref 12.0–15.0)
MCH: 25.5 pg — ABNORMAL LOW (ref 26.0–34.0)
MCHC: 31.3 g/dL (ref 30.0–36.0)
MCV: 81.5 fL (ref 80.0–100.0)
Platelets: 385 10*3/uL (ref 150–400)
RBC: 3.68 MIL/uL — ABNORMAL LOW (ref 3.87–5.11)
RDW: 14.9 % (ref 11.5–15.5)
WBC: 7.7 10*3/uL (ref 4.0–10.5)
nRBC: 0 % (ref 0.0–0.2)

## 2020-02-10 LAB — GLUCOSE, CAPILLARY
Glucose-Capillary: 160 mg/dL — ABNORMAL HIGH (ref 70–99)
Glucose-Capillary: 102 mg/dL — ABNORMAL HIGH (ref 70–99)
Glucose-Capillary: 97 mg/dL (ref 70–99)
Glucose-Capillary: 113 mg/dL — ABNORMAL HIGH (ref 70–99)

## 2020-02-10 LAB — MAGNESIUM
Magnesium: 1.5 mg/dL — ABNORMAL LOW (ref 1.7–2.4)
Magnesium: 1.6 mg/dL — ABNORMAL LOW (ref 1.7–2.4)

## 2020-02-10 MED ORDER — MAGNESIUM SULFATE 2 GM/50ML IV SOLN
2.0000 g | Freq: Once | INTRAVENOUS | Status: AC
  Administered 2020-02-10: 2 g via INTRAVENOUS
  Filled 2020-02-10: qty 50

## 2020-02-10 MED ORDER — METOCLOPRAMIDE HCL 5 MG PO TABS: 5.0000 mg | ORAL_TABLET | Freq: Four times a day (QID) | ORAL | 0 refills | Status: DC | PRN

## 2020-02-10 MED ORDER — POTASSIUM CHLORIDE CRYS ER 20 MEQ PO TBCR
40.0000 meq | EXTENDED_RELEASE_TABLET | Freq: Once | ORAL | Status: AC
  Administered 2020-02-10: 40 meq via ORAL
  Filled 2020-02-10: qty 2

## 2020-02-10 MED ORDER — METOCLOPRAMIDE HCL 5 MG PO TABS: 5.0000 mg | ORAL_TABLET | Freq: Three times a day (TID) | ORAL | 0 refills | Status: DC | PRN
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
## 2020-02-10 NOTE — Discharge Instructions (Signed)
Diabetic Ketoacidosis Diabetic ketoacidosis (DKA) is a serious complication of diabetes. This condition develops when there is not enough insulin in the body. Insulin is a hormone that regulates blood sugar (glucose) levels in the body. Normally, insulin allows glucose to enter the cells in the body. The cells break down glucose for energy. Without enough insulin, the body cannot break down glucose and breaks down fats instead. This leads to high blood glucose levels in the body. It also leads to the production of acids that are called ketones. Ketones are poisonous at high levels. If diabetic ketoacidosis is not treated, it can cause severe dehydration and can lead to a coma or death. What are the causes? This condition develops when a lack of insulin causes the body to break down fats instead of glucose. This may be triggered by:  Stress on the body. This stress can be brought on by an illness.  Infection.  Medicines that raise blood glucose levels.  Not taking or skipping doses of diabetes medicines.  New onset of type 1 diabetes mellitus.  Missing insulin on purpose or by accident.  Interruption of insulin through an insulin pump. This can happen if the cannula that connects you to the insulin pump gets dislodged or kinked. What are the signs or symptoms? Symptoms of this condition include:  Excessive thirst or dry mouth.  Excessive urination.  Abdominal pain.  Nausea or vomiting.  Vision changes.  Fruity or sweet-smelling breath.  Weight loss.  Irritability or confusion.  Rapid breathing.  High blood glucose.  High levels of ketones in the body. To know your ketone levels: ? Collect urine in a small cup. ? Dip a test strip in the urine. ? Wait for it to change color. ? Compare the test strip results to the chart on the container. How is this diagnosed? This condition is diagnosed based on your medical history, a physical exam, and blood tests. You may also have a  urine test to check for ketones. How is this treated? This condition may be treated with:  Fluid replacement. This may be done with IV fluids to correct dehydration.  Correcting high blood glucose with insulin. This may be given through the skin as injections or through an IV.  Electrolyte replacement. Electrolytes are minerals in your blood. Electrolytes such as potassium and sodium may be given in pill form or through an IV.  Antibiotic medicines. These may be prescribed if your condition was caused by an infection. Diabetic ketoacidosis is a serious medical condition. You may need emergency treatment in the hospital so that you can be monitored closely. Follow these instructions at home: Medicines  Take over-the-counter and prescription medicines only as told by your health care provider.  Continue to take insulin and other diabetes medicines as told by your health care provider.  If you were prescribed an antibiotic medicine, take it as told by your health care provider. Do not stop taking the antibiotic even if you start to feel better. Eating and drinking  Drink enough fluids to keep your urine pale yellow.  If you are able to eat, follow your usual diet and drink sugar-free liquids such as water, tea, and sugar-free soft drinks. You can also have sugar-free gelatin or ice pops.  If you are not able to eat, drink liquids that contain sugar in small amounts as you are able. Liquids include fruit juice, regular soft drinks, and sherbet.   Checking ketones and blood glucose  Check your urine for ketones  when you are ill and as told by your health care provider. ? If your blood glucose is 240 mg/dL (13.3 mmol/L) or higher, check your urine ketones every 4 hours. If you have moderate or large ketones, call your health care provider.  Check your blood glucose every day, and as often as told by your health care provider. ? If your blood glucose is high, drink plenty of fluids. This  helps to flush out ketones. ? If your blood glucose is above your target for 2 tests in a row, contact your health care provider.   General instructions  Carry a medical alert card or wear medical alert jewelry that shows that you have diabetes.  Exercise only as told by your health care provider. Do not exercise when your blood glucose is high and you have ketones in your urine.  If you get sick, call your health care provider and begin treatment quickly. Your body often needs extra insulin to fight an illness. Check your blood glucose every 4 hours when you are sick.  Keep all follow-up visits as told by your health care provider. This is important. Where to find more information  American Diabetes Association: diabetes.org Contact a health care provider if:  Your blood glucose level is higher than 240 mg/dL (13.3 mmol/L) for 2 days in a row.  You have moderate or large ketones in your urine.  You have a fever.  You cannot eat or drink without vomiting.  You have been vomiting for more than 2 hours.  You continue to have symptoms of diabetic ketoacidosis.  You develop new symptoms. Get help right away if:  Your blood glucose monitor reads high even when you are taking insulin.  You faint.  You have chest pain.  You have trouble breathing.  You have sudden trouble speaking or swallowing.  You have vomiting or diarrhea that gets worse after 3 hours.  You are unable to stay awake.  You have trouble thinking.  You are severely dehydrated. Symptoms of severe dehydration include: ? Extreme thirst. ? Dry mouth. ? Rapid breathing. These symptoms may represent a serious problem that is an emergency. Do not wait to see if the symptoms will go away. Get medical help right away. Call your local emergency services (911 in the [place] Do not drive yourself to the hospital. Summary  Diabetic ketoacidosis is a serious complication of diabetes. This condition develops when there  is not enough insulin in the body.  This condition is diagnosed based on your medical history, a physical exam, and blood tests. You may also have a urine test to check for ketones.  Diabetic ketoacidosis is a serious medical condition. You may need emergency treatment in the hospital to monitor your condition.  Contact your health care provider if your blood glucose is higher than 240 mg/dL for 2 days in a row or if you have moderate or large ketones in your urine. This information is not intended to replace advice given to you by your health care provider. Make sure you discuss any questions you have with your health care provider. Document Revised: 11/29/2018 Document Reviewed: 11/29/2018 Elsevier Patient Education  2021 [employer].   Blood Pressure Record Sheet To take your blood pressure, you will need a blood pressure machine. You can buy a blood pressure machine (blood pressure monitor) at your clinic, drug store, or online. When choosing one, consider:  An automatic monitor that has an arm cuff.  A cuff that wraps snugly around  your upper arm. You should be able to fit only one finger between your arm and the cuff.  A device that stores blood pressure reading results.  Do not choose a monitor that measures your blood pressure from your wrist or finger. Follow your health care provider's instructions for how to take your blood pressure. To use this form:  Get one reading in the morning (a.m.) before you take any medicines.  Get one reading in the evening (p.m.) before supper.  Take at least 2 readings with each blood pressure check. This makes sure the results are correct. Wait 1-2 minutes between measurements.  Write down the results in the spaces on this form.  Repeat this once a week, or as told by your health care provider.  Make a follow-up appointment with your health care provider to discuss the results. Blood pressure log Date: _______________________  a.m.  _____________________(1st reading) _____________________(2nd reading)  p.m. _____________________(1st reading) _____________________(2nd reading) Date: _______________________  a.m. _____________________(1st reading) _____________________(2nd reading)  p.m. _____________________(1st reading) _____________________(2nd reading) Date: _______________________  a.m. _____________________(1st reading) _____________________(2nd reading)  p.m. _____________________(1st reading) _____________________(2nd reading) Date: _______________________  a.m. _____________________(1st reading) _____________________(2nd reading)  p.m. _____________________(1st reading) _____________________(2nd reading) Date: _______________________  a.m. _____________________(1st reading) _____________________(2nd reading)  p.m. _____________________(1st reading) _____________________(2nd reading) This information is not intended to replace advice given to you by your health care provider. Make sure you discuss any questions you have with your health care provider. Document Revised: 04/25/2019 Document Reviewed: 04/25/2019 Elsevier Patient Education  2021 [place].   How to Take Your Blood Pressure Blood pressure measures how strongly your blood is pressing against the walls of your arteries. Arteries are blood vessels that carry blood from your heart throughout your body. You can take your blood pressure at home with a machine. You may need to check your blood pressure at home:  To check if you have high blood pressure (hypertension).  To check your blood pressure over time.  To make sure your blood pressure medicine is working. Supplies needed:  Blood pressure machine, or monitor.  Dining room chair to sit in.  Table or desk.  Small notebook.  Pencil or pen. How to prepare Avoid these things for 30 minutes before checking your blood pressure:  Having drinks with caffeine in them, such as coffee  or tea.  Drinking alcohol.  Eating.  Smoking.  Exercising. Do these things five minutes before checking your blood pressure:  Go to the bathroom and pee (urinate).  Sit in a dining chair. Do not sit in a soft couch or an armchair.  Be quiet. Do not talk. How to take your blood pressure Follow the instructions that came with your machine. If you have a digital blood pressure monitor, these may be the instructions: 1. Sit up straight. 2. Place your feet on the floor. Do not cross your ankles or legs. 3. Rest your left arm at the level of your heart. You may rest it on a table, desk, or chair. 4. Pull up your shirt sleeve. 5. Wrap the blood pressure cuff around the upper part of your left arm. The cuff should be 1 inch (2.5 cm) above your elbow. It is best to wrap the cuff around bare skin. 6. Fit the cuff snugly around your arm. You should be able to place only one finger between the cuff and your arm. 7. Place the cord so that it rests in the bend of your elbow. 8. Press the power button. 9. Sit quietly  while the cuff fills with air and loses air. 10. Write down the numbers on the screen. 11. Wait 2-3 minutes and then repeat steps 1-10.   What do the numbers mean? Two numbers make up your blood pressure. The first number is called systolic pressure. The second is called diastolic pressure. An example of a blood pressure reading is "120 over 80" (or 120/80). If you are an adult and do not have a medical condition, use this guide to find out if your blood pressure is normal: Normal  First number: below 120.  Second number: below 80. Elevated  First number: 120-129.  Second number: below 80. Hypertension stage 1  First number: 130-139.  Second number: 80-89. Hypertension stage 2  First number: 140 or above.  Second number: 89 or above. Your blood pressure is above normal even if only the top or bottom number is above normal. Follow these instructions at home:  Check  your blood pressure as often as your doctor tells you to.  Check your blood pressure at the same time every day.  Take your monitor to your next doctor's appointment. Your doctor will: ? Make sure you are using it correctly. ? Make sure it is working right.  Make sure you understand what your blood pressure numbers should be.  Tell your doctor if your medicine is causing side effects.  Keep all follow-up visits as told by your doctor. This is important. General tips:  You will need a blood pressure machine, or monitor. Your doctor can suggest a monitor. You can buy one at a drugstore or online. When choosing one: ? Choose one with an arm cuff. ? Choose one that wraps around your upper arm. Only one finger should fit between your arm and the cuff. ? Do not choose one that measures your blood pressure from your wrist or finger. Where to find more information American Heart Association: www.heart.org Contact a doctor if:  Your blood pressure keeps being high. Get help right away if:  Your first blood pressure number is higher than 180.  Your second blood pressure number is higher than 120. Summary  Check your blood pressure at the same time every day.  Avoid caffeine, alcohol, smoking, and exercise for 30 minutes before checking your blood pressure.  Make sure you understand what your blood pressure numbers should be. This information is not intended to replace advice given to you by your health care provider. Make sure you discuss any questions you have with your health care provider. Document Revised: 12/29/2018 Document Reviewed: 12/29/2018 Elsevier Patient Education  2021 [employer].

## 2020-02-10 NOTE — Progress Notes (Signed)
D/c instruction given to the pt. Pt verbalized understanding of instructions give. Will remove IV once mag. Gtt is complete.Cont with plan of care
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
## 2020-02-26 ENCOUNTER — Encounter (INDEPENDENT_AMBULATORY_CARE_PROVIDER_SITE_OTHER): Payer: Self-pay | Admitting: Ophthalmology

## 2020-02-26 ENCOUNTER — Ambulatory Visit (INDEPENDENT_AMBULATORY_CARE_PROVIDER_SITE_OTHER): Payer: 59 | Admitting: Ophthalmology

## 2020-02-26 ENCOUNTER — Other Ambulatory Visit: Payer: Self-pay

## 2020-02-26 DIAGNOSIS — H4313 Vitreous hemorrhage, bilateral: Secondary | ICD-10-CM

## 2020-02-26 DIAGNOSIS — H3342 Traction detachment of retina, left eye: Secondary | ICD-10-CM

## 2020-02-26 DIAGNOSIS — H25811 Combined forms of age-related cataract, right eye: Secondary | ICD-10-CM

## 2020-02-26 DIAGNOSIS — I10 Essential (primary) hypertension: Secondary | ICD-10-CM

## 2020-02-26 DIAGNOSIS — Z961 Presence of intraocular lens: Secondary | ICD-10-CM

## 2020-02-26 DIAGNOSIS — H35033 Hypertensive retinopathy, bilateral: Secondary | ICD-10-CM

## 2020-02-26 DIAGNOSIS — E103513 Type 1 diabetes mellitus with proliferative diabetic retinopathy with macular edema, bilateral: Secondary | ICD-10-CM

## 2020-02-26 DIAGNOSIS — H35372 Puckering of macula, left eye: Secondary | ICD-10-CM | POA: Diagnosis not present

## 2020-02-26 DIAGNOSIS — H3581 Retinal edema: Secondary | ICD-10-CM

## 2020-02-27 ENCOUNTER — Encounter (INDEPENDENT_AMBULATORY_CARE_PROVIDER_SITE_OTHER): Payer: Self-pay | Admitting: Ophthalmology

## 2020-02-28 ENCOUNTER — Other Ambulatory Visit (INDEPENDENT_AMBULATORY_CARE_PROVIDER_SITE_OTHER): Payer: Self-pay | Admitting: Ophthalmology

## 2020-02-28 MED ORDER — LATANOPROST 0.005 % OP SOLN: 1.0000 [drp] | Freq: Every day | OPHTHALMIC | 6 refills | Status: DC

## 2020-03-04 ENCOUNTER — Encounter (HOSPITAL_COMMUNITY): Payer: Self-pay | Admitting: Emergency Medicine

## 2020-03-04 ENCOUNTER — Other Ambulatory Visit: Payer: Self-pay

## 2020-03-04 ENCOUNTER — Inpatient Hospital Stay (HOSPITAL_COMMUNITY)
Admission: EM | Admit: 2020-03-04 | Discharge: 2020-03-08 | DRG: 638 | Disposition: A | Discharge status: Home / Self Care | Payer: 59 | Attending: Student | Admitting: Student

## 2020-03-04 ENCOUNTER — Observation Stay (HOSPITAL_COMMUNITY): Payer: 59

## 2020-03-04 DIAGNOSIS — R112 Nausea with vomiting, unspecified: Secondary | ICD-10-CM | POA: Diagnosis not present

## 2020-03-04 DIAGNOSIS — N179 Acute kidney failure, unspecified: Secondary | ICD-10-CM | POA: Diagnosis present

## 2020-03-04 DIAGNOSIS — R0602 Shortness of breath: Secondary | ICD-10-CM | POA: Diagnosis not present

## 2020-03-04 DIAGNOSIS — I120 Hypertensive chronic kidney disease with stage 5 chronic kidney disease or end stage renal disease: Secondary | ICD-10-CM | POA: Diagnosis present

## 2020-03-04 DIAGNOSIS — E1143 Type 2 diabetes mellitus with diabetic autonomic (poly)neuropathy: Secondary | ICD-10-CM | POA: Diagnosis present

## 2020-03-04 DIAGNOSIS — E10319 Type 1 diabetes mellitus with unspecified diabetic retinopathy without macular edema: Secondary | ICD-10-CM | POA: Diagnosis present

## 2020-03-04 DIAGNOSIS — E1043 Type 1 diabetes mellitus with diabetic autonomic (poly)neuropathy: Secondary | ICD-10-CM | POA: Diagnosis present

## 2020-03-04 DIAGNOSIS — H35033 Hypertensive retinopathy, bilateral: Secondary | ICD-10-CM | POA: Diagnosis present

## 2020-03-04 DIAGNOSIS — Z79899 Other long term (current) drug therapy: Secondary | ICD-10-CM

## 2020-03-04 DIAGNOSIS — N185 Chronic kidney disease, stage 5: Secondary | ICD-10-CM | POA: Diagnosis present

## 2020-03-04 DIAGNOSIS — Z6827 Body mass index (BMI) 27.0-27.9, adult: Secondary | ICD-10-CM

## 2020-03-04 DIAGNOSIS — E876 Hypokalemia: Secondary | ICD-10-CM | POA: Diagnosis present

## 2020-03-04 DIAGNOSIS — Z794 Long term (current) use of insulin: Secondary | ICD-10-CM

## 2020-03-04 DIAGNOSIS — I160 Hypertensive urgency: Secondary | ICD-10-CM | POA: Diagnosis present

## 2020-03-04 DIAGNOSIS — M6282 Rhabdomyolysis: Secondary | ICD-10-CM | POA: Diagnosis present

## 2020-03-04 DIAGNOSIS — E663 Overweight: Secondary | ICD-10-CM | POA: Diagnosis present

## 2020-03-04 DIAGNOSIS — E872 Acidosis: Secondary | ICD-10-CM

## 2020-03-04 DIAGNOSIS — Z8249 Family history of ischemic heart disease and other diseases of the circulatory system: Secondary | ICD-10-CM

## 2020-03-04 DIAGNOSIS — Z833 Family history of diabetes mellitus: Secondary | ICD-10-CM

## 2020-03-04 DIAGNOSIS — Z9114 Patient's other noncompliance with medication regimen: Secondary | ICD-10-CM

## 2020-03-04 DIAGNOSIS — E1010 Type 1 diabetes mellitus with ketoacidosis without coma: Secondary | ICD-10-CM | POA: Diagnosis not present

## 2020-03-04 DIAGNOSIS — D631 Anemia in chronic kidney disease: Secondary | ICD-10-CM | POA: Diagnosis present

## 2020-03-04 DIAGNOSIS — E1022 Type 1 diabetes mellitus with diabetic chronic kidney disease: Secondary | ICD-10-CM | POA: Diagnosis present

## 2020-03-04 DIAGNOSIS — Z20822 Contact with and (suspected) exposure to covid-19: Secondary | ICD-10-CM | POA: Diagnosis present

## 2020-03-04 DIAGNOSIS — K3184 Gastroparesis: Secondary | ICD-10-CM | POA: Diagnosis present

## 2020-03-04 LAB — COMPREHENSIVE METABOLIC PANEL
ALT: 14 U/L (ref 0–44)
AST: 17 U/L (ref 15–41)
Albumin: 2.1 g/dL — ABNORMAL LOW (ref 3.5–5.0)
Alkaline Phosphatase: 69 U/L (ref 38–126)
Anion gap: 15 (ref 5–15)
BUN: 35 mg/dL — ABNORMAL HIGH (ref 6–20)
CO2: 14 mmol/L — ABNORMAL LOW (ref 22–32)
Calcium: 8.1 mg/dL — ABNORMAL LOW (ref 8.9–10.3)
Chloride: 107 mmol/L (ref 98–111)
Creatinine, Ser: 4.39 mg/dL — ABNORMAL HIGH (ref 0.44–1.00)
GFR, Estimated: 13 mL/min — ABNORMAL LOW (ref >60)
Glucose, Bld: 313 mg/dL — ABNORMAL HIGH (ref 70–99)
Potassium: 3.5 mmol/L (ref 3.5–5.1)
Sodium: 136 mmol/L (ref 135–145)
Total Bilirubin: 0.6 mg/dL (ref 0.3–1.2)
Total Protein: 6.3 g/dL — ABNORMAL LOW (ref 6.5–8.1)

## 2020-03-04 LAB — BETA-HYDROXYBUTYRIC ACID
Beta-Hydroxybutyric Acid: 3.37 mmol/L — ABNORMAL HIGH (ref 0.05–0.27)
Beta-Hydroxybutyric Acid: 4.18 mmol/L — ABNORMAL HIGH (ref 0.05–0.27)

## 2020-03-04 LAB — CBC
HCT: 39.5 % (ref 36.0–46.0)
Hemoglobin: 12.9 g/dL (ref 12.0–15.0)
MCH: 26 pg (ref 26.0–34.0)
MCHC: 32.7 g/dL (ref 30.0–36.0)
MCV: 79.5 fL — ABNORMAL LOW (ref 80.0–100.0)
Platelets: 560 10*3/uL — ABNORMAL HIGH (ref 150–400)
RBC: 4.97 MIL/uL (ref 3.87–5.11)
RDW: 14.2 % (ref 11.5–15.5)
WBC: 9.9 10*3/uL (ref 4.0–10.5)
nRBC: 0 % (ref 0.0–0.2)

## 2020-03-04 LAB — BASIC METABOLIC PANEL
Anion gap: 18 — ABNORMAL HIGH (ref 5–15)
BUN: 39 mg/dL — ABNORMAL HIGH (ref 6–20)
CO2: 15 mmol/L — ABNORMAL LOW (ref 22–32)
Calcium: 8.9 mg/dL (ref 8.9–10.3)
Chloride: 106 mmol/L (ref 98–111)
Creatinine, Ser: 4.93 mg/dL — ABNORMAL HIGH (ref 0.44–1.00)
GFR, Estimated: 12 mL/min — ABNORMAL LOW (ref >60)
Glucose, Bld: 334 mg/dL — ABNORMAL HIGH (ref 70–99)
Potassium: 3.8 mmol/L (ref 3.5–5.1)
Sodium: 139 mmol/L (ref 135–145)

## 2020-03-04 LAB — RESP PANEL BY RT-PCR (FLU A&B, COVID) ARPGX2
Influenza A by PCR: NEGATIVE
Influenza B by PCR: NEGATIVE
SARS Coronavirus 2 by RT PCR: NEGATIVE

## 2020-03-04 LAB — BLOOD GAS, VENOUS
Acid-base deficit: 7.8 mmol/L — ABNORMAL HIGH (ref 0.0–2.0)
Bicarbonate: 13.5 mmol/L — ABNORMAL LOW (ref 20.0–28.0)
O2 Saturation: 88.2 %
Patient temperature: 98.6
pCO2, Ven: <19 mmHg — CL (ref 44.0–60.0)
pH, Ven: 7.471 — ABNORMAL HIGH (ref 7.250–7.430)
pO2, Ven: 52.1 mmHg — ABNORMAL HIGH (ref 32.0–45.0)

## 2020-03-04 LAB — CBG MONITORING, ED
Glucose-Capillary: 309 mg/dL — ABNORMAL HIGH (ref 70–99)
Glucose-Capillary: 209 mg/dL — ABNORMAL HIGH (ref 70–99)
Glucose-Capillary: 303 mg/dL — ABNORMAL HIGH (ref 70–99)

## 2020-03-04 LAB — HCG, QUANTITATIVE, PREGNANCY: hCG, Beta Chain, Quant, S: 2 m[IU]/mL (ref <5)

## 2020-03-04 LAB — LIPASE, BLOOD: Lipase: 28 U/L (ref 11–51)

## 2020-03-04 IMAGING — DX DG CHEST 1V
1 series · 1 of 1 positions shown · non-contrast
Comparison: [DATE]

CLINICAL DATA: Nausea, vomiting and shortness of breath times 24
hours.

EXAM:
CHEST  1 VIEW

[chest ap]
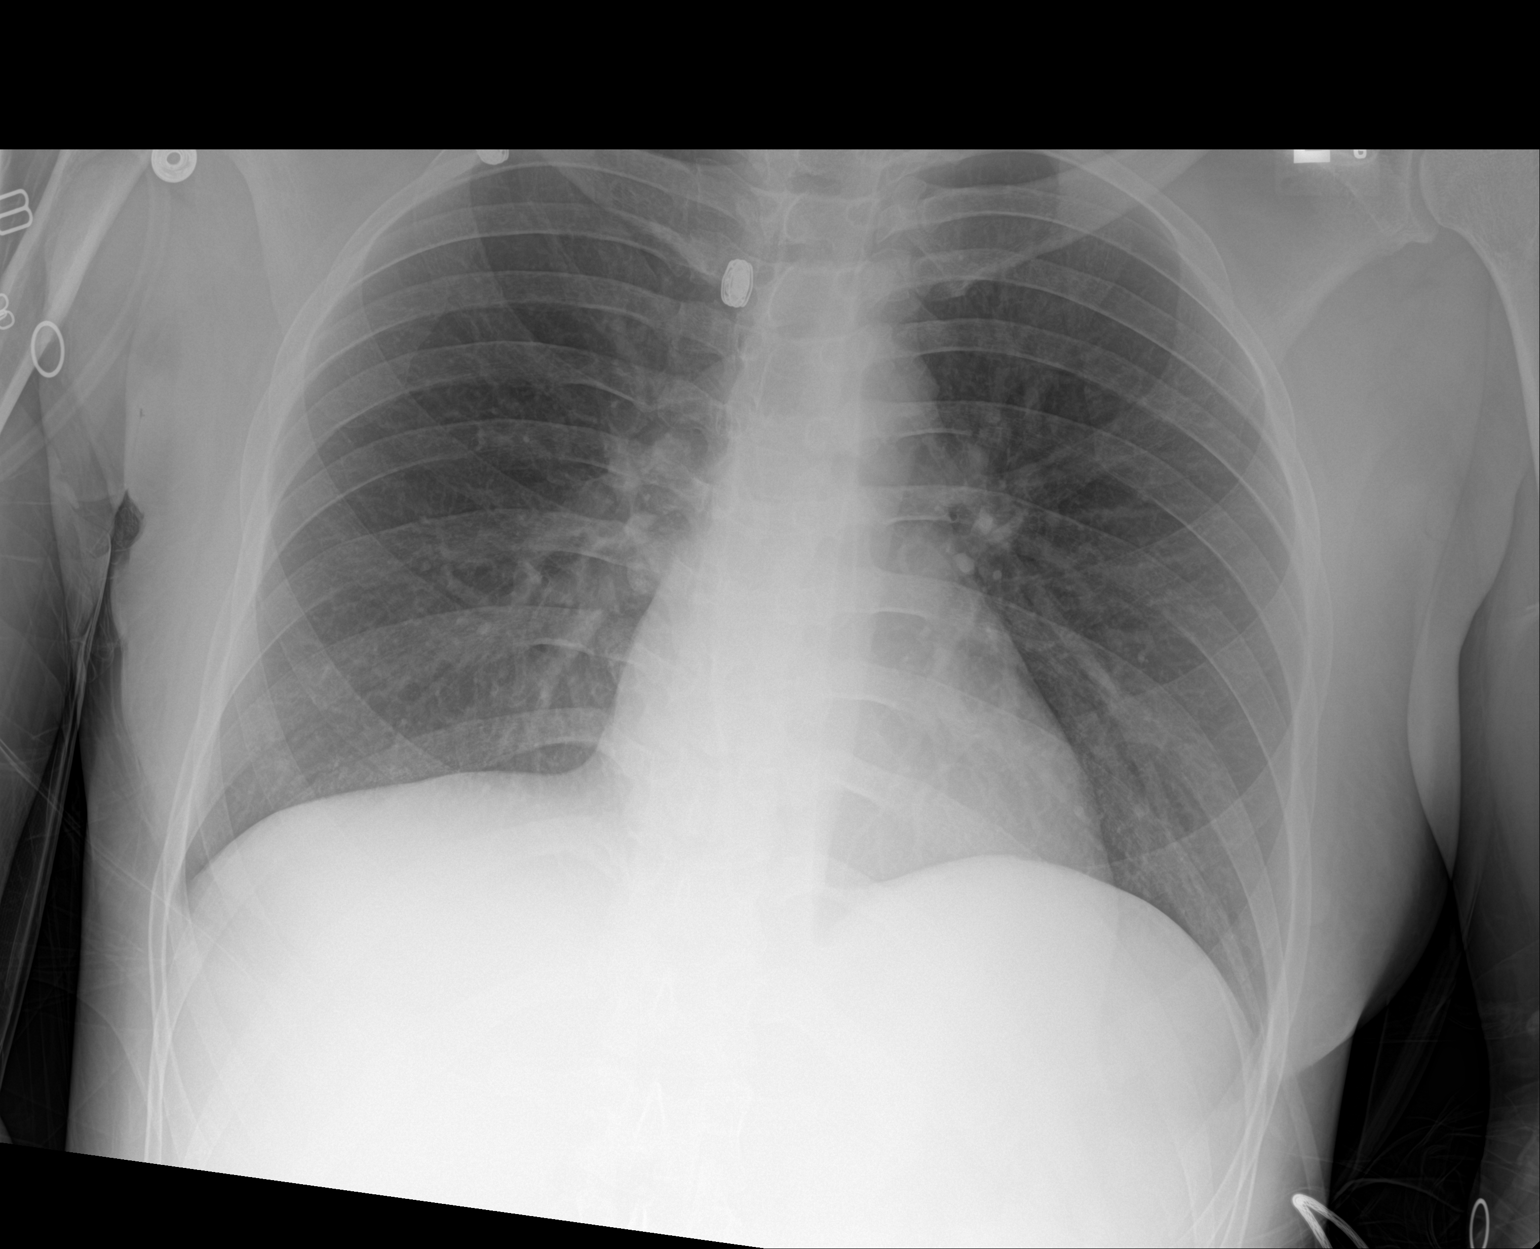

[1 of 1 positions shown; findings below may reference images not displayed]

FINDINGS: The heart size and mediastinal contours are within normal limits.
Both lungs are clear. The visualized skeletal structures are
unremarkable.
IMPRESSION: No active disease.

## 2020-03-04 MED ORDER — ENOXAPARIN SODIUM 40 MG/0.4ML ~~LOC~~ SOLN: 40.0000 mg | SUBCUTANEOUS | Status: DC

## 2020-03-04 MED ORDER — METOPROLOL TARTRATE 5 MG/5ML IV SOLN
5.0000 mg | Freq: Four times a day (QID) | INTRAVENOUS | Status: DC
  Administered 2020-03-04 – 2020-03-05 (×2): 5 mg via INTRAVENOUS
  Filled 2020-03-04 (×2): qty 5

## 2020-03-04 MED ORDER — LACTATED RINGERS IV SOLN: INTRAVENOUS | Status: DC

## 2020-03-04 MED ORDER — HEPARIN SODIUM (PORCINE) 5000 UNIT/ML IJ SOLN
5000.0000 [IU] | Freq: Three times a day (TID) | INTRAMUSCULAR | Status: DC
  Administered 2020-03-05 – 2020-03-07 (×9): 5000 [IU] via SUBCUTANEOUS
  Filled 2020-03-04 (×10): qty 1

## 2020-03-04 MED ORDER — DEXTROSE IN LACTATED RINGERS 5 % IV SOLN: INTRAVENOUS | Status: DC

## 2020-03-04 MED ORDER — POLYETHYLENE GLYCOL 3350 17 G PO PACK: 17.0000 g | PACK | Freq: Every day | ORAL | Status: DC | PRN

## 2020-03-04 MED ORDER — HYDRALAZINE HCL 20 MG/ML IJ SOLN
10.0000 mg | Freq: Once | INTRAMUSCULAR | Status: AC
  Administered 2020-03-04: 10 mg via INTRAVENOUS
  Filled 2020-03-04: qty 1

## 2020-03-04 MED ORDER — PANTOPRAZOLE SODIUM 40 MG IV SOLR
40.0000 mg | INTRAVENOUS | Status: DC
  Administered 2020-03-05: 40 mg via INTRAVENOUS
  Filled 2020-03-04: qty 40

## 2020-03-04 MED ORDER — INSULIN REGULAR(HUMAN) IN NACL 100-0.9 UT/100ML-% IV SOLN
INTRAVENOUS | Status: DC
  Administered 2020-03-04: 3.4 [IU]/h via INTRAVENOUS
  Filled 2020-03-04: qty 100

## 2020-03-04 MED ORDER — LACTATED RINGERS IV BOLUS
1000.0000 mL | Freq: Once | INTRAVENOUS | Status: AC
  Administered 2020-03-04: 1000 mL via INTRAVENOUS

## 2020-03-04 MED ORDER — PROMETHAZINE HCL 25 MG/ML IJ SOLN
25.0000 mg | Freq: Once | INTRAMUSCULAR | Status: AC
  Administered 2020-03-04: 25 mg via INTRAVENOUS
  Filled 2020-03-04: qty 1

## 2020-03-04 MED ORDER — DEXTROSE 50 % IV SOLN: 0.0000 mL | INTRAVENOUS | Status: DC | PRN

## 2020-03-04 MED ORDER — DROPERIDOL 2.5 MG/ML IJ SOLN
1.2500 mg | Freq: Once | INTRAMUSCULAR | Status: AC
  Administered 2020-03-04: 1.25 mg via INTRAVENOUS
  Filled 2020-03-04: qty 2

## 2020-03-04 MED ORDER — ONDANSETRON HCL 4 MG PO TABS: 4.0000 mg | ORAL_TABLET | Freq: Four times a day (QID) | ORAL | Status: DC | PRN

## 2020-03-04 MED ORDER — HYDRALAZINE HCL 20 MG/ML IJ SOLN
10.0000 mg | Freq: Four times a day (QID) | INTRAMUSCULAR | Status: DC
  Administered 2020-03-05: 10 mg via INTRAVENOUS
  Filled 2020-03-04: qty 1

## 2020-03-04 MED ORDER — ACETAMINOPHEN 650 MG RE SUPP: 650.0000 mg | Freq: Four times a day (QID) | RECTAL | Status: DC | PRN

## 2020-03-04 MED ORDER — ACETAMINOPHEN 325 MG PO TABS
650.0000 mg | ORAL_TABLET | Freq: Four times a day (QID) | ORAL | Status: DC | PRN
  Administered 2020-03-07: 650 mg via ORAL
  Filled 2020-03-04: qty 2

## 2020-03-04 MED ORDER — ONDANSETRON HCL 4 MG/2ML IJ SOLN
4.0000 mg | Freq: Four times a day (QID) | INTRAMUSCULAR | Status: DC | PRN
  Administered 2020-03-04 – 2020-03-05 (×4): 4 mg via INTRAVENOUS
  Filled 2020-03-04 (×4): qty 2

## 2020-03-04 NOTE — ED Triage Notes (Signed)
Patient complains of emesis and SOB that started today, states her CBG was 246. DM Type 1, unable to keep down any home medications.

## 2020-03-04 NOTE — ED Notes (Signed)
Dr Cyd Silence contacted about floor not taking insulin gtt states will change bed to icu/stepdown

## 2020-03-04 NOTE — ED Notes (Signed)
I assisted pt to bedside commode!

## 2020-03-04 NOTE — H&P (Signed)
History and Physical    Barbara Donovan [id number] DOB: 04-20-1990 DOA: 03/04/2020  PCP: Fanny Bien, MD  Patient coming from: home   Chief Complaint:  Chief Complaint  Patient presents with  . Emesis  . Shortness of Breath     HPI:    30 year old female with past medical history of diabetes mellitus type 1, gastroparesis, chronic kidney disease stage V, anemia of chronic disease and hypertension who presents to [facility] emergency department with complaints of nausea and vomiting.  Patient explains that yesterday patient began to experience sudden onset of abdominal pain.  Patient describes his abdominal pain as mainly located in her lower abdomen radiating diffusely.  Abdominal pain is crampy, 8 out of 10 in intensity and waxes and wanes in intensity.  Approximately the same time patient also began to experience intense nausea and frequent bouts of nonbilious nonbloody vomiting.  Symptoms continue to persist over the following 24 h.  Because of patient's ongoing symptoms of nausea and vomiting patient has been unable to tolerate any food or drink.  Patient has been unable to take any of her oral medications.  Due to progressive symptoms patient is also began to experience generalized malaise and weakness.  Due to progressively worsening symptoms the patient eventually presented to [facility] emergency department for evaluation.  Upon evaluation in the emergency department, patient was found to be exhibiting intractable nausea and vomiting.  Symptoms persisted despite administration of several agents including droperidol and Phenergan.  Patient was also noted to be suffering from substantial hypertensive urgency with blood pressures as high as 229/111.  Review of Systems:   Review of Systems  Constitutional: Positive for diaphoresis and malaise/fatigue.  Gastrointestinal: Positive for abdominal pain, nausea and vomiting.  Neurological: Positive for  weakness.  All other systems reviewed and are negative.   Past Medical History:  Diagnosis Date  . Asthma    as a child, no problems as an adult, no inhaler  . Cataract    NS OU  . Chronic hypertension during pregnancy, antepartum 08/19/2017  . Dehydration 01/28/2018  . Depression during pregnancy, antepartum 07/07/2017   6/20: Short trial of zoloft previously, reports didn't help much but also didn't give it a chance Discussed r/b/a SSRIs in pregnancy, agrees to try Zoloft again, rx sent No SI/HI/red flags  . Diabetes ([place])    TYPE I  . Diabetic retinopathy ([place]) 06/09/2017   07/2017 with bilateral severe diabetic non-proliferative retinopathy with macular edema.  Marland Kitchen HTN (hypertension)   . Hypertensive retinopathy    OU  . Hypokalemia 01/22/2018  . Hypomagnesemia 01/28/2018  . Intractable nausea and vomiting 01/22/2018  . Intrauterine growth restriction (IUGR) affecting care of mother 12/22/2017  . Morbid obesity ([place])   . 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 Proteinuria due to type 1 diabetes mellitus ([place]) 11/02/2017   Baseline Pr: Cr 10.23  . Severe hyperemesis gravidarum 10/30/2017  . Type I diabetes mellitus ([place]) 07/07/2017   Current Diabetic Medications:  Insulin  '[x]'$  Aspirin 81 mg daily after 12 weeks (? A2/B GDM)  Required Referrals for A1GDM or A2GDM: '[x]'$  Diabetes Education and Testing Supplies '[x]'$  Nutrition Cousult  For A2/B GDM or higher classes of DM '[x]'$  Diabetes Education and Testing Supplies '[x]'$  Nutrition Counsult '[x]'$  Fetal ECHO after 20 weeks  '[x]'$  Eye exam for retina evaluation - severe retinopathy 7/19  Base  . Ventricular septal defect (VSD) of fetus in singleton pregnancy, antepartum 09/30/2017   May go to  newborn nursery per Dr. Lenard Simmer Echo prior to discharge    Past Surgical History:  Procedure Laterality Date  . [place] VITRECTOMY WITH 20 GAUGE MVR PORT FOR MACULAR HOLE Left 07/20/2018   Procedure: 25 GAUGE PARS PLANA VITRECTOMY LEFT EYE;  Surgeon: Bernarda Caffey, MD;  Location: [place];  Service: Ophthalmology;  Laterality: Left;  . CESAREAN SECTION N/A 12/26/2017   Procedure: CESAREAN SECTION;  Surgeon: Osborne Oman, MD;  Location: [place];  Service: Obstetrics;  Laterality: N/A;  . EYE EXAMINATION UNDER ANESTHESIA Right 07/20/2018   Procedure: Eye Exam Under Anesthesia RIGHT EYE;  Surgeon: Bernarda Caffey, MD;  Location: [place];  Service: Ophthalmology;  Laterality: Right;  . EYE SURGERY Left 07/2018  . GAS INSERTION Left 07/19/2019   Procedure: INSERTION OF GAS;  Surgeon: Bernarda Caffey, MD;  Location: [place];  Service: Ophthalmology;  Laterality: Left;  SF6  . INJECTION OF SILICONE OIL Left 99991111   Procedure: Injection Of Silicone Oil LEFT EYE;  Surgeon: Bernarda Caffey, MD;  Location: [place];  Service: Ophthalmology;  Laterality: Left;  . LASER PHOTO ABLATION Right 07/20/2018   Procedure: Laser Photo Ablation RIGHT EYE;  Surgeon: Bernarda Caffey, MD;  Location: [place];  Service: Ophthalmology;  Laterality: Right;  . MEMBRANE PEEL Left 07/20/2018   Procedure: Membrane Peel LEFT EYE;  Surgeon: Bernarda Caffey, MD;  Location: [place];  Service: Ophthalmology;  Laterality: Left;  . MEMBRANE PEEL Left 07/19/2019   Procedure: MEMBRANE PEEL;  Surgeon: Bernarda Caffey, MD;  Location: [place];  Service: Ophthalmology;  Laterality: Left;  . MITOMYCIN C APPLICATION Bilateral 99991111   Procedure: Avastin Application;  Surgeon: Bernarda Caffey, MD;  Location: [place];  Service: Ophthalmology;  Laterality: Bilateral;  . PHOTOCOAGULATION WITH LASER Left 07/20/2018   Procedure: Photocoagulation With Laser LEFT EYE;  Surgeon: Bernarda Caffey, MD;  Location: [place];  Service: Ophthalmology;  Laterality: Left;  . PHOTOCOAGULATION WITH LASER Left 07/19/2019   Procedure: PHOTOCOAGULATION WITH LASER;  Surgeon: Bernarda Caffey, MD;  Location: [place];  Service: Ophthalmology;  Laterality: Left;  . RETINAL DETACHMENT SURGERY Left 07/20/2018   Dr. Bernarda Caffey  . SILICON OIL REMOVAL Left  07/19/2019   Procedure: 25g PARS PLANA VITRECTOMY WITH SILICON OIL REMOVAL;  Surgeon: Bernarda Caffey, MD;  Location: [place];  Service: Ophthalmology;  Laterality: Left;  . WISDOM TOOTH EXTRACTION       reports that she has never smoked. She has never used smokeless tobacco. She reports previous alcohol use. She reports that she does not use drugs.  No Known Allergies  Family History  Problem Relation Age of Onset  . Diabetes Mother   . Aneurysm Mother 55  . Seizures Mother   . Diabetes Father   . Cataracts Father   . COPD Father   . Heart attack Father   . Heart disease Father   . Healthy Sister   . Healthy Daughter   . Stroke Maternal Grandfather   . Amblyopia Neg Hx   . Blindness Neg Hx   . Glaucoma Neg Hx   . Macular degeneration Neg Hx   . Retinal detachment Neg Hx   . Strabismus Neg Hx   . Retinitis pigmentosa Neg Hx   . Colon cancer Neg Hx   . Stomach cancer Neg Hx   . Esophageal cancer Neg Hx   . Pancreatic cancer Neg Hx   . Liver disease Neg Hx      Prior to Admission medications  Medication Sig Start Date End Date Taking? Authorizing Provider  carvedilol (COREG) 25 MG tablet Take 1 tablet (25 mg total) by mouth 2 (two) times daily. 03/07/19  Yes Imogene Burn, PA-C  cloNIDine (CATAPRES) 0.1 MG tablet Take 1 tablet (0.1 mg total) by mouth 2 (two) times daily. Hold for sbp < 110 or hr < 60/min 01/27/20 02/26/20 Yes Kc, Maren [place], MD  diltiazem (CARDIZEM CD) 240 MG 24 hr capsule Take 1 capsule (240 mg total) by mouth daily. 03/08/19  Yes Imogene Burn, PA-C  hydrALAZINE (APRESOLINE) 50 MG tablet Take 50 mg by mouth 3 (three) times daily. 12/27/19  Yes [provider]  insulin glargine (LANTUS SOLOSTAR) 100 UNIT/ML Solostar Pen Inject 8 Units into the skin daily. 01/27/20  Yes Antonieta Pert, MD  insulin lispro (HUMALOG KWIKPEN) 100 UNIT/ML KwikPen Inject 4 Units into the skin 3 (three) times daily for 25 days. 01/27/20 02/21/20 Yes Antonieta Pert, MD  latanoprost (XALATAN)  0.005 % ophthalmic solution Place 1 drop into the left eye at bedtime. 02/28/20  Yes Bernarda Caffey, MD  metoCLOPramide (REGLAN) 5 MG tablet Take 1 tablet (5 mg total) by mouth every 8 (eight) hours as needed for nausea or vomiting. 02/10/20  Yes Mercy Riding, MD  sertraline (ZOLOFT) 100 MG tablet Take 100 mg by mouth every morning. 02/15/20  Yes [provider]  sodium bicarbonate 325 MG tablet Take 325 mg by mouth 4 (four) times daily.   Yes [provider]  traZODone (DESYREL) 50 MG tablet Take 50 mg by mouth at bedtime. 02/16/20  Yes [provider]  Continuous Blood Gluc Sensor MISC 1 each by Does not apply route as directed. Use as directed every 14 days. May dispense FreeStyle [employer] or similar. 01/27/20   Antonieta Pert, MD  pantoprazole (PROTONIX) 40 MG tablet Take 1 tablet (40 mg total) by mouth daily. Patient not taking: Reported on 03/04/2020 01/31/20   Ladene Artist, MD    Physical Exam: Vitals:   03/04/20 1830 03/04/20 1931 03/04/20 2005 03/04/20 2100  BP: (!) 225/126 (!) 197/111 (!) 151/86 (!) 199/105  Pulse: 100 (!) 114 (!) 115 (!) 115  Resp: '18 16 16   '$ Temp:      TempSrc:      SpO2: 99% 97% 97% 95%  Weight:      Height:         Constitutional: Acute alert and oriented x3, patient is in distress due to abdominal pain.   Skin: no rashes, no lesions, slightly poor skin turgor noted. Eyes: Pupils are equally reactive to light.  No evidence of scleral icterus or conjunctival pallor.  ENMT: Slightly dry mucous membranes noted.  Posterior pharynx clear of any exudate or lesions.   Neck: normal, supple, no masses, no thyromegaly.  No evidence of jugular venous distension.   Respiratory: clear to auscultation bilaterally, no wheezing, no crackles. Normal respiratory effort. No accessory muscle use.  Cardiovascular: Tachycardic but regular, no murmurs / rubs / gallops. No extremity edema. 2+ pedal pulses. No carotid bruits.  Chest:   Nontender  without crepitus or deformity.   Back:   Nontender without crepitus or deformity. Abdomen: notable generalized abdominal tenderness.  Abdomen is soft however.  No evidence of intra-abdominal masses.  Positive bowel sounds noted in all quadrants.   Musculoskeletal: No joint deformity upper and lower extremities. Good ROM, no contractures. Normal muscle tone.  Neurologic: CN 2-12 grossly intact. Sensation intact.  Patient moving all 4 extremities  spontaneously.  Patient is following all commands.  Patient is responsive to verbal stimuli.   Psychiatric: Patient exhibits anxious mood with appropriate affect.  Patient seems to possess insight as to their current situation.     Labs on Admission: I have personally reviewed following labs and imaging studies -   CBC: Recent Labs  Lab 03/04/20 1330  WBC 9.9  HGB 12.9  HCT 39.5  MCV 79.5*  PLT [id number]*   Basic Metabolic Panel: Recent Labs  Lab 03/04/20 1330  NA 136  K 3.5  CL 107  CO2 14*  GLUCOSE 313*  BUN 35*  CREATININE 4.39*  CALCIUM 8.1*   GFR: Estimated Creatinine Clearance: 19.8 mL/min (A) (by C-G formula based on SCr of 4.39 mg/dL (H)). Liver Function Tests: Recent Labs  Lab 03/04/20 1330  AST 17  ALT 14  ALKPHOS 69  BILITOT 0.6  PROT 6.3*  ALBUMIN 2.1*   Recent Labs  Lab 03/04/20 1330  LIPASE 28   No results for input(s): AMMONIA in the last 168 hours. Coagulation Profile: No results for input(s): INR, PROTIME in the last 168 hours. Cardiac Enzymes: No results for input(s): CKTOTAL, CKMB, CKMBINDEX, TROPONINI in the last 168 hours. BNP (last 3 results) No results for input(s): PROBNP in the last 8760 hours. HbA1C: No results for input(s): HGBA1C in the last 72 hours. CBG: Recent Labs  Lab 03/04/20 1332  GLUCAP 309*   Lipid Profile: No results for input(s): CHOL, HDL, LDLCALC, TRIG, CHOLHDL, LDLDIRECT in the last 72 hours. Thyroid Function Tests: No results for input(s): TSH, T4TOTAL, FREET4, T3FREE,  THYROIDAB in the last 72 hours. Anemia Panel: No results for input(s): VITAMINB12, FOLATE, FERRITIN, TIBC, IRON, RETICCTPCT in the last 72 hours. Urine analysis:    Component Value Date/Time   COLORURINE YELLOW 02/09/2020 0826   APPEARANCEUR HAZY (A) 02/09/2020 0826   LABSPEC 1.012 02/09/2020 0826   PHURINE 7.0 02/09/2020 0826   GLUCOSEU >=500 (A) 02/09/2020 0826   HGBUR SMALL (A) 02/09/2020 0826   BILIRUBINUR NEGATIVE 02/09/2020 0826   KETONESUR 20 (A) 02/09/2020 0826   PROTEINUR >=300 (A) 02/09/2020 0826   UROBILINOGEN 0.2 08/14/2019 1051   NITRITE NEGATIVE 02/09/2020 0826   LEUKOCYTESUR TRACE (A) 02/09/2020 0826    Radiological Exams on Admission - Personally Reviewed: DG Chest 1 View  Result Date: 03/04/2020 CLINICAL DATA:  Nausea, vomiting and shortness of breath times 24 hours. EXAM: CHEST  1 VIEW COMPARISON:  January 24, 2020 FINDINGS: The heart size and mediastinal contours are within normal limits. Both lungs are clear. The visualized skeletal structures are unremarkable. IMPRESSION: No active disease. Electronically Signed   By: Virgina Norfolk M.D.   On: 03/04/2020 21:12    EKG: Personally reviewed.  Rhythm is sinus tachycardia with heart rate of 110 bpm.  No dynamic ST segment changes appreciated.  Assessment/Plan Principal Problem:   Intractable nausea and vomiting   Patient is exhibiting a 2-day history of intractable nausea and vomiting with associated severe abdominal pain.  Patient explains that this presentation is consistent with her previous presentations of diabetic gastroparesis.  No clinical evidence of infection.  Chest x-ray is unremarkable.  Urinalysis has been ordered.  Covid testing is negative.  Patient has been trialed with several antiemetics during her stay in the emergency department.  Considering known history of diabetic gastroparesis will place patient on scheduled intravenous Reglan with additional as needed Zofran for breakthrough  symptoms.  Hydrating patient with intravenous isotonic fluids  N.p.o. for now  Active Problems: Diabetic ketoacidosis without coma associated with type 1 diabetes mellitus ([place])   Patient is exhibiting a gap acidosis with hyperglycemia and elevated beta hydroxybutyrate consistent with diabetic ketoacidosis  This is likely secondary to patient's noncompliance with her medications over the past 2 days  Patient has been placed on intravenous insulin infusion  Hydrating patient with intravenous fluids  Performing serial chemistries and beta hydroxybutyrate levels  Keeping patient n.p.o.  We'll transition patient to basal bolus insulin therapy once gap is closed and patient is able to tolerate oral intake.    Hypertensive urgency  Patient exhibiting markedly elevated blood pressures in the emergency department concerning for hypertensive urgency and impending hypertensive crisis  Attending initially to place patient on scheduled intravenous hydralazine, scheduled intravenous metoprolol and clonidine patch  We'll monitor closely and if this is unsuccessful we may need to transition patient to an antihypertensive drip such as a labetalol drip.    Diabetic gastroparesis ([place])   Please see assessment and plan above    Anemia due to stage 5 chronic kidney disease (HCC)    Hemoglobin stable  No clinical evidence of bleeding  Monitor hemoglobin and hematocrit with serial CBCs.   Code Status:  Full code Family Communication: deferred   Status is: Observation  The patient remains OBS appropriate and will d/c before 2 midnights.  Dispo: The patient is from: Home              Anticipated d/c is to: Home              Anticipated d/c date is: 2 days              Patient currently is not medically stable to d/c.   Difficult to place patient No        Vernelle Emerald MD [facility] Pager [phone]  If 7PM-7AM, please contact  night-coverage [email or web address] Use universal [place] password for that web site. If you do not have the password, please call the hospital operator.  03/04/2020, 9:49 PM
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
## 2020-03-04 NOTE — ED Provider Notes (Signed)
[facility] DEPT Provider Note   CSN: [id number] Arrival date & time: 03/04/20  1302     History Chief Complaint  Patient presents with  . Emesis  . Shortness of Breath    Barbara Donovan is a 30 y.o. female.  HPI Young female with insulin-dependent diabetes presents with 1 day of nausea, vomiting, p.o. intolerance, and abdominal pain. She notes that she has had similar episodes multiple times in the past, was hospitalized 1 month ago due to this constellation of changes. Yesterday she was generally well, did have one episode of vomiting, but notes that she improved after taking home medication.  On today, she awoke with worsening nausea, increasing generalized discomfort, and has been unable to take any medication for relief. She has had innumerable episodes of vomiting, no diarrhea, no fever. No true pain, though again, she notes generalized discomfort.   History obtained from the patient and chart review including documentation from hospitalization last month for episode of DKA.    Past Medical History:  Diagnosis Date  . Asthma    as a child, no problems as an adult, no inhaler  . Cataract    NS OU  . Chronic hypertension during pregnancy, antepartum 08/19/2017  . Dehydration 01/28/2018  . Depression during pregnancy, antepartum 07/07/2017   6/20: Short trial of zoloft previously, reports didn't help much but also didn't give it a chance Discussed r/b/a SSRIs in pregnancy, agrees to try Zoloft again, rx sent No SI/HI/red flags  . Diabetes ([place])    TYPE I  . Diabetic retinopathy ([place]) 06/09/2017   07/2017 with bilateral severe diabetic non-proliferative retinopathy with macular edema.  Marland Kitchen HTN (hypertension)   . Hypertensive retinopathy    OU  . Hypokalemia 01/22/2018  . Hypomagnesemia 01/28/2018  . Intractable nausea and vomiting 01/22/2018  . Intrauterine growth restriction (IUGR) affecting care of mother 12/22/2017  . Morbid obesity ([place])   .  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 Proteinuria due to type 1 diabetes mellitus ([place]) 11/02/2017   Baseline Pr: Cr 10.23  . Severe hyperemesis gravidarum 10/30/2017  . Type I diabetes mellitus ([place]) 07/07/2017   Current Diabetic Medications:  Insulin  '[x]'$  Aspirin 81 mg daily after 12 weeks (? A2/B GDM)  Required Referrals for A1GDM or A2GDM: '[x]'$  Diabetes Education and Testing Supplies '[x]'$  Nutrition Cousult  For A2/B GDM or higher classes of DM '[x]'$  Diabetes Education and Testing Supplies '[x]'$  Nutrition Counsult '[x]'$  Fetal ECHO after 20 weeks  '[x]'$  Eye exam for retina evaluation - severe retinopathy 7/19  Base  . Ventricular septal defect (VSD) of fetus in singleton pregnancy, antepartum 09/30/2017   May go to newborn nursery per Dr. Lenard Simmer Echo prior to discharge    Patient Active Problem List   Diagnosis Date Noted  . Diabetic gastroparesis ([place]) 03/04/2020  . Anemia due to stage 5 chronic kidney disease ([place]) 03/04/2020  . Diabetic ketoacidosis without coma associated with type 1 diabetes mellitus ([place]) 02/09/2020  . Hypothermia 01/24/2020  . DKA (diabetic ketoacidoses) 09/14/2019  . COVID-19 virus infection 09/14/2019  . SOB (shortness of breath) 03/27/2018  . Hypomagnesemia 01/28/2018  . Dehydration 01/28/2018  . Intractable nausea and vomiting 01/22/2018  . Hypertensive urgency 01/22/2018  . Hypokalemia 01/22/2018  . Nephropathy, diabetic ([place]) 12/29/2017  . Proteinuria due to type 1 diabetes mellitus ([place]) 11/02/2017  . Metabolic acidosis [id number]  . Chronic hypertension 08/21/2017  . Uncontrolled type 1 diabetes mellitus with stage 5 chronic kidney disease not on  chronic dialysis, with long-term current use of insulin ([place]) 07/07/2017  . Diabetic retinopathy ([place]) 06/09/2017  . Acute depression 11/02/2014  . Asthma 10/30/2013    Past Surgical History:  Procedure Laterality Date  . [place] VITRECTOMY WITH 20 GAUGE MVR PORT FOR MACULAR HOLE Left 07/20/2018    Procedure: 25 GAUGE PARS PLANA VITRECTOMY LEFT EYE;  Surgeon: Bernarda Caffey, MD;  Location: [facility];  Service: Ophthalmology;  Laterality: Left;  . CESAREAN SECTION N/A 12/26/2017   Procedure: CESAREAN SECTION;  Surgeon: Osborne Oman, MD;  Location: [facility];  Service: Obstetrics;  Laterality: N/A;  . EYE EXAMINATION UNDER ANESTHESIA Right 07/20/2018   Procedure: Eye Exam Under Anesthesia RIGHT EYE;  Surgeon: Bernarda Caffey, MD;  Location: [facility];  Service: Ophthalmology;  Laterality: Right;  . EYE SURGERY Left 07/2018  . GAS INSERTION Left 07/19/2019   Procedure: INSERTION OF GAS;  Surgeon: Bernarda Caffey, MD;  Location: [facility];  Service: Ophthalmology;  Laterality: Left;  SF6  . INJECTION OF SILICONE OIL Left [id number]   Procedure: Injection Of Silicone Oil LEFT EYE;  Surgeon: Bernarda Caffey, MD;  Location: [facility];  Service: Ophthalmology;  Laterality: Left;  . LASER PHOTO ABLATION Right 07/20/2018   Procedure: Laser Photo Ablation RIGHT EYE;  Surgeon: Bernarda Caffey, MD;  Location: [facility];  Service: Ophthalmology;  Laterality: Right;  . MEMBRANE PEEL Left 07/20/2018   Procedure: Membrane Peel LEFT EYE;  Surgeon: Bernarda Caffey, MD;  Location: [facility];  Service: Ophthalmology;  Laterality: Left;  . MEMBRANE PEEL Left 07/19/2019   Procedure: MEMBRANE PEEL;  Surgeon: Bernarda Caffey, MD;  Location: [facility];  Service: Ophthalmology;  Laterality: Left;  . MITOMYCIN C APPLICATION Bilateral [id number]   Procedure: Avastin Application;  Surgeon: Bernarda Caffey, MD;  Location: [facility];  Service: Ophthalmology;  Laterality: Bilateral;  . PHOTOCOAGULATION WITH LASER Left 07/20/2018   Procedure: Photocoagulation With Laser LEFT EYE;  Surgeon: Bernarda Caffey, MD;  Location: [facility];  Service: Ophthalmology;  Laterality: Left;  . PHOTOCOAGULATION WITH LASER Left 07/19/2019   Procedure: PHOTOCOAGULATION WITH LASER;  Surgeon: Bernarda Caffey, MD;  Location: [facility];  Service: Ophthalmology;  Laterality: Left;  . RETINAL DETACHMENT  SURGERY Left 07/20/2018   Dr. Bernarda Caffey  . SILICON OIL REMOVAL Left 07/19/2019   Procedure: 25g PARS PLANA VITRECTOMY WITH SILICON OIL REMOVAL;  Surgeon: Bernarda Caffey, MD;  Location: [facility];  Service: Ophthalmology;  Laterality: Left;  . WISDOM TOOTH EXTRACTION       OB History    Gravida  1   Para  1   Term      Preterm  1   AB      Living  1     SAB      IAB      Ectopic      Multiple  0   Live Births  1           Family History  Problem Relation Age of Onset  . Diabetes Mother   . Aneurysm Mother 13  . Seizures Mother   . Diabetes Father   . Cataracts Father   . COPD Father   . Heart attack Father   . Heart disease Father   . Healthy Sister   . Healthy Daughter   . Stroke Maternal Grandfather   . Amblyopia Neg Hx   . Blindness Neg Hx   . Glaucoma Neg Hx   . Macular degeneration Neg Hx   .  Retinal detachment Neg Hx   . Strabismus Neg Hx   . Retinitis pigmentosa Neg Hx   . Colon cancer Neg Hx   . Stomach cancer Neg Hx   . Esophageal cancer Neg Hx   . Pancreatic cancer Neg Hx   . Liver disease Neg Hx     Social History   Tobacco Use  . Smoking status: Never Smoker  . Smokeless tobacco: Never Used  Vaping Use  . Vaping Use: Never used  Substance Use Topics  . Alcohol use: Not Currently    Comment: SOCIALLY  . Drug use: Never    Home Medications Prior to Admission medications   Medication Sig Start Date End Date Taking? Authorizing Provider  carvedilol (COREG) 25 MG tablet Take 1 tablet (25 mg total) by mouth 2 (two) times daily. 03/07/19  Yes Imogene Burn, PA-C  cloNIDine (CATAPRES) 0.1 MG tablet Take 1 tablet (0.1 mg total) by mouth 2 (two) times daily. Hold for sbp < 110 or hr < 60/min 01/27/20 02/26/20 Yes Kc, Maren Beach, MD  diltiazem (CARDIZEM CD) 240 MG 24 hr capsule Take 1 capsule (240 mg total) by mouth daily. 03/08/19  Yes Imogene Burn, PA-C  hydrALAZINE (APRESOLINE) 50 MG tablet Take 50 mg by mouth 3 (three) times daily.  12/27/19  Yes [provider]  insulin glargine (LANTUS SOLOSTAR) 100 UNIT/ML Solostar Pen Inject 8 Units into the skin daily. 01/27/20  Yes Antonieta Pert, MD  insulin lispro (HUMALOG KWIKPEN) 100 UNIT/ML KwikPen Inject 4 Units into the skin 3 (three) times daily for 25 days. 01/27/20 02/21/20 Yes Antonieta Pert, MD  latanoprost (XALATAN) 0.005 % ophthalmic solution Place 1 drop into the left eye at bedtime. 02/28/20  Yes Bernarda Caffey, MD  metoCLOPramide (REGLAN) 5 MG tablet Take 1 tablet (5 mg total) by mouth every 8 (eight) hours as needed for nausea or vomiting. 02/10/20  Yes Mercy Riding, MD  sertraline (ZOLOFT) 100 MG tablet Take 100 mg by mouth every morning. 02/15/20  Yes [provider]  sodium bicarbonate 325 MG tablet Take 325 mg by mouth 4 (four) times daily.   Yes [provider]  traZODone (DESYREL) 50 MG tablet Take 50 mg by mouth at bedtime. 02/16/20  Yes [provider]  Continuous Blood Gluc Sensor MISC 1 each by Does not apply route as directed. Use as directed every 14 days. May dispense FreeStyle [employer] or similar. 01/27/20   Antonieta Pert, MD  pantoprazole (PROTONIX) 40 MG tablet Take 1 tablet (40 mg total) by mouth daily. Patient not taking: Reported on 03/04/2020 01/31/20   Ladene Artist, MD    Allergies    Patient has no known allergies.  Review of Systems   Review of Systems  Constitutional:       Per HPI, otherwise negative  HENT:       Per HPI, otherwise negative  Respiratory:       Per HPI, otherwise negative  Cardiovascular:       Per HPI, otherwise negative  Gastrointestinal: Positive for nausea and vomiting. Negative for abdominal pain.  Endocrine:       Negative aside from HPI  Genitourinary:       Neg aside from HPI   Musculoskeletal:       Per HPI, otherwise negative  Skin: Negative.   Neurological: Positive for weakness. Negative for syncope.    Physical Exam Updated Vital Signs BP (!) 199/105   Pulse (!) 115  Temp 97.8 F (36.6 C) (Oral)   Resp 16   Ht '5\' 6"'$  (1.676 m)   Wt 77.1 kg   LMP  (LMP Unknown)   SpO2 95%   BMI 27.44 kg/m   Physical Exam Vitals and nursing note reviewed.  Constitutional:      Appearance: She is well-developed and well-nourished. She is ill-appearing and diaphoretic.     Comments: Uncomfortable appearing thin adult female awake and alert speaking clearly, though with an emesis bag that is full of yellow-clear liquid.  HENT:     Head: Normocephalic and atraumatic.  Eyes:     Extraocular Movements: EOM normal.     Conjunctiva/sclera: Conjunctivae normal.  Cardiovascular:     Rate and Rhythm: Normal rate and regular rhythm.  Pulmonary:     Effort: Pulmonary effort is normal. No respiratory distress.     Breath sounds: Normal breath sounds. No stridor.  Abdominal:     General: There is no distension.     Palpations: Abdomen is soft.     Tenderness: There is no guarding.     Comments: When distracted, no guarding, no rebound, no tenderness to palpation  Musculoskeletal:        General: No edema.  Skin:    General: Skin is warm.  Neurological:     Mental Status: She is alert and oriented to person, place, and time.     Cranial Nerves: No cranial nerve deficit.  Psychiatric:        Mood and Affect: Mood and affect normal.     ED Results / Procedures / Treatments   Labs (all labs ordered are listed, but only abnormal results are displayed) Labs Reviewed  COMPREHENSIVE METABOLIC PANEL - Abnormal; Notable for the following components:      Result Value   CO2 14 (*)    Glucose, Bld 313 (*)    BUN 35 (*)    Creatinine, Ser 4.39 (*)    Calcium 8.1 (*)    Total Protein 6.3 (*)    Albumin 2.1 (*)    GFR, Estimated 13 (*)    All other components within normal limits  CBC - Abnormal; Notable for the following components:   MCV 79.5 (*)    Platelets 560 (*)    All other components within normal limits  BLOOD GAS, VENOUS - Abnormal; Notable for the  following components:   pH, Ven 7.471 (*)    pCO2, Ven <19.0 (*)    pO2, Ven 52.1 (*)    Bicarbonate 13.5 (*)    Acid-base deficit 7.8 (*)    All other components within normal limits  BETA-HYDROXYBUTYRIC ACID - Abnormal; Notable for the following components:   Beta-Hydroxybutyric Acid 3.37 (*)    All other components within normal limits  CBG MONITORING, ED - Abnormal; Notable for the following components:   Glucose-Capillary 309 (*)    All other components within normal limits  CBG MONITORING, ED - Abnormal; Notable for the following components:   Glucose-Capillary 209 (*)    All other components within normal limits  RESP PANEL BY RT-PCR (FLU A&B, COVID) ARPGX2  LIPASE, BLOOD  HCG, QUANTITATIVE, PREGNANCY  URINALYSIS, ROUTINE W REFLEX MICROSCOPIC  LACTIC ACID, PLASMA  COMPREHENSIVE METABOLIC PANEL  MAGNESIUM  CBC WITH DIFFERENTIAL/PLATELET  BASIC METABOLIC PANEL  BASIC METABOLIC PANEL  BASIC METABOLIC PANEL  BETA-HYDROXYBUTYRIC ACID  BETA-HYDROXYBUTYRIC ACID  POC SARS CORONAVIRUS 2 AG -  ED    EKG None  Radiology DG Chest 1 View  Result Date: 03/04/2020 CLINICAL DATA:  Nausea, vomiting and shortness of breath times 24 hours. EXAM: CHEST  1 VIEW COMPARISON:  January 24, 2020 FINDINGS: The heart size and mediastinal contours are within normal limits. Both lungs are clear. The visualized skeletal structures are unremarkable. IMPRESSION: No active disease. Electronically Signed   By: Virgina Norfolk M.D.   On: 03/04/2020 21:12     Medications Ordered in ED Medications  pantoprazole (PROTONIX) injection 40 mg (has no administration in time range)  acetaminophen (TYLENOL) tablet 650 mg (has no administration in time range)    Or  acetaminophen (TYLENOL) suppository 650 mg (has no administration in time range)  polyethylene glycol (MIRALAX / GLYCOLAX) packet 17 g (has no administration in time range)  ondansetron (ZOFRAN) tablet 4 mg ( Oral See Alternative 03/04/20 2157)     Or  ondansetron [facility]) injection 4 mg (4 mg Intravenous Given 03/04/20 2157)  metoprolol tartrate (LOPRESSOR) injection 5 mg (has no administration in time range)  heparin injection 5,000 Units (has no administration in time range)  insulin regular, human (MYXREDLIN) 100 units/ 100 mL infusion (3.4 Units/hr Intravenous New Bag/Given 03/04/20 2200)  lactated ringers infusion ( Intravenous Stopped 03/04/20 2154)  dextrose 5 % in lactated ringers infusion ( Intravenous New Bag/Given 03/04/20 2155)  dextrose 50 % solution 0-50 mL (has no administration in time range)  hydrALAZINE (APRESOLINE) injection 10 mg (has no administration in time range)  lactated ringers bolus 1,000 mL (0 mLs Intravenous Stopped 03/04/20 1724)  droperidol (INAPSINE) 2.5 MG/ML injection 1.25 mg (1.25 mg Intravenous Given 03/04/20 1609)  hydrALAZINE (APRESOLINE) injection 10 mg (10 mg Intravenous Given 03/04/20 1925)  promethazine (PHENERGAN) injection 25 mg (25 mg Intravenous Given 03/04/20 1925)    ED Course  I have reviewed the triage vital signs and the nursing notes.  Pertinent labs & imaging results that were available during my care of the patient were reviewed by me and considered in my medical decision making (see chart for details).  Young female presents with intractable nausea, vomiting, since yesterday.  With her history of insulin-dependent diabetes, chronic kidney disease, gastroparesis, patient was placed on continuous monitoring, started on fluids, received droperidol. Additional monitors notable for mild tachycardia, sinus tach, 110, abnormal Pulse oximetry 100%, room air normal  Update:, Patient continues to complain of nausea, vomiting, has received Phenergan, with persistent hypertension, will receive hydralazine.  Update:, Blood pressure has now improved substantially from systolic greater than [id number], now 150.  Nausea has seemingly improved, though she has had some ongoing vomiting. Patient's labs with  mixed results, mild hyperglycemia, but alkalosis on blood gas suggesting compensation.  Patient is Covid negative.  Patient required admission for monitoring, management Final Clinical Impression(s) / ED Diagnoses Final diagnoses:  Intractable vomiting with nausea  Hypertensive urgency   CRITICAL CARE Performed by: Carmin Muskrat Total critical care time: 35 minutes Critical care time was exclusive of separately billable procedures and treating other patients. Critical care was necessary to treat or prevent imminent or life-threatening deterioration. Critical care was time spent personally by me on the following activities: development of treatment plan with patient and/or surrogate as well as nursing, discussions with consultants, evaluation of patient's response to treatment, examination of patient, obtaining history from patient or surrogate, ordering and performing treatments and interventions, ordering and review of laboratory studies, ordering and review of radiographic studies, pulse oximetry and re-evaluation of patient's condition.    Carmin Muskrat, MD 03/04/20 2222

## 2020-03-05 ENCOUNTER — Encounter (INDEPENDENT_AMBULATORY_CARE_PROVIDER_SITE_OTHER): Payer: 59 | Admitting: Ophthalmology

## 2020-03-05 DIAGNOSIS — E1022 Type 1 diabetes mellitus with diabetic chronic kidney disease: Secondary | ICD-10-CM | POA: Diagnosis present

## 2020-03-05 DIAGNOSIS — E1043 Type 1 diabetes mellitus with diabetic autonomic (poly)neuropathy: Secondary | ICD-10-CM | POA: Diagnosis present

## 2020-03-05 DIAGNOSIS — N189 Chronic kidney disease, unspecified: Secondary | ICD-10-CM

## 2020-03-05 DIAGNOSIS — R0602 Shortness of breath: Secondary | ICD-10-CM | POA: Diagnosis present

## 2020-03-05 DIAGNOSIS — D631 Anemia in chronic kidney disease: Secondary | ICD-10-CM | POA: Diagnosis present

## 2020-03-05 DIAGNOSIS — I120 Hypertensive chronic kidney disease with stage 5 chronic kidney disease or end stage renal disease: Secondary | ICD-10-CM | POA: Diagnosis present

## 2020-03-05 DIAGNOSIS — E1010 Type 1 diabetes mellitus with ketoacidosis without coma: Principal | ICD-10-CM

## 2020-03-05 DIAGNOSIS — K3184 Gastroparesis: Secondary | ICD-10-CM

## 2020-03-05 DIAGNOSIS — N179 Acute kidney failure, unspecified: Secondary | ICD-10-CM

## 2020-03-05 DIAGNOSIS — Z9114 Patient's other noncompliance with medication regimen: Secondary | ICD-10-CM | POA: Diagnosis not present

## 2020-03-05 DIAGNOSIS — Z833 Family history of diabetes mellitus: Secondary | ICD-10-CM | POA: Diagnosis not present

## 2020-03-05 DIAGNOSIS — R112 Nausea with vomiting, unspecified: Secondary | ICD-10-CM

## 2020-03-05 DIAGNOSIS — E876 Hypokalemia: Secondary | ICD-10-CM

## 2020-03-05 DIAGNOSIS — E10319 Type 1 diabetes mellitus with unspecified diabetic retinopathy without macular edema: Secondary | ICD-10-CM | POA: Diagnosis present

## 2020-03-05 DIAGNOSIS — Z6827 Body mass index (BMI) 27.0-27.9, adult: Secondary | ICD-10-CM | POA: Diagnosis not present

## 2020-03-05 DIAGNOSIS — Z8249 Family history of ischemic heart disease and other diseases of the circulatory system: Secondary | ICD-10-CM | POA: Diagnosis not present

## 2020-03-05 DIAGNOSIS — I160 Hypertensive urgency: Secondary | ICD-10-CM | POA: Diagnosis present

## 2020-03-05 DIAGNOSIS — E1143 Type 2 diabetes mellitus with diabetic autonomic (poly)neuropathy: Secondary | ICD-10-CM

## 2020-03-05 DIAGNOSIS — M6282 Rhabdomyolysis: Secondary | ICD-10-CM | POA: Diagnosis present

## 2020-03-05 DIAGNOSIS — N185 Chronic kidney disease, stage 5: Secondary | ICD-10-CM | POA: Diagnosis present

## 2020-03-05 DIAGNOSIS — Z794 Long term (current) use of insulin: Secondary | ICD-10-CM | POA: Diagnosis not present

## 2020-03-05 DIAGNOSIS — Z20822 Contact with and (suspected) exposure to covid-19: Secondary | ICD-10-CM | POA: Diagnosis present

## 2020-03-05 DIAGNOSIS — H35033 Hypertensive retinopathy, bilateral: Secondary | ICD-10-CM | POA: Diagnosis present

## 2020-03-05 DIAGNOSIS — Z79899 Other long term (current) drug therapy: Secondary | ICD-10-CM | POA: Diagnosis not present

## 2020-03-05 DIAGNOSIS — E663 Overweight: Secondary | ICD-10-CM | POA: Diagnosis present

## 2020-03-05 LAB — RENAL FUNCTION PANEL
Albumin: 2.3 g/dL — ABNORMAL LOW (ref 3.5–5.0)
Albumin: 2.2 g/dL — ABNORMAL LOW (ref 3.5–5.0)
Anion gap: 13 (ref 5–15)
Anion gap: 11 (ref 5–15)
BUN: 41 mg/dL — ABNORMAL HIGH (ref 6–20)
BUN: 40 mg/dL — ABNORMAL HIGH (ref 6–20)
CO2: 19 mmol/L — ABNORMAL LOW (ref 22–32)
CO2: 20 mmol/L — ABNORMAL LOW (ref 22–32)
Calcium: 8.9 mg/dL (ref 8.9–10.3)
Calcium: 8.6 mg/dL — ABNORMAL LOW (ref 8.9–10.3)
Chloride: 111 mmol/L (ref 98–111)
Chloride: 107 mmol/L (ref 98–111)
Creatinine, Ser: 5.47 mg/dL — ABNORMAL HIGH (ref 0.44–1.00)
Creatinine, Ser: 5.66 mg/dL — ABNORMAL HIGH (ref 0.44–1.00)
GFR, Estimated: 10 mL/min — ABNORMAL LOW (ref >60)
GFR, Estimated: 10 mL/min — ABNORMAL LOW (ref >60)
Glucose, Bld: 207 mg/dL — ABNORMAL HIGH (ref 70–99)
Glucose, Bld: 149 mg/dL — ABNORMAL HIGH (ref 70–99)
Phosphorus: 3.2 mg/dL (ref 2.5–4.6)
Phosphorus: 3.5 mg/dL (ref 2.5–4.6)
Potassium: 3.2 mmol/L — ABNORMAL LOW (ref 3.5–5.1)
Potassium: 3.1 mmol/L — ABNORMAL LOW (ref 3.5–5.1)
Sodium: 143 mmol/L (ref 135–145)
Sodium: 138 mmol/L (ref 135–145)

## 2020-03-05 LAB — URINALYSIS, ROUTINE W REFLEX MICROSCOPIC
Bilirubin Urine: NEGATIVE
Glucose, UA: >=500 mg/dL — AB
Hgb urine dipstick: NEGATIVE
Ketones, ur: 5 mg/dL — AB
Leukocytes,Ua: NEGATIVE
Nitrite: NEGATIVE
Protein, ur: >=300 mg/dL — AB
Specific Gravity, Urine: 1.015 (ref 1.005–1.030)
pH: 7 (ref 5.0–8.0)

## 2020-03-05 LAB — CBC WITH DIFFERENTIAL/PLATELET
Abs Immature Granulocytes: 0.05 10*3/uL (ref 0.00–0.07)
Basophils Absolute: 0 10*3/uL (ref 0.0–0.1)
Basophils Relative: 0 %
Eosinophils Absolute: 0 10*3/uL (ref 0.0–0.5)
Eosinophils Relative: 0 %
HCT: 41.4 % (ref 36.0–46.0)
Hemoglobin: 12.9 g/dL (ref 12.0–15.0)
Immature Granulocytes: 1 %
Lymphocytes Relative: 9 %
Lymphs Abs: 0.9 10*3/uL (ref 0.7–4.0)
MCH: 25.2 pg — ABNORMAL LOW (ref 26.0–34.0)
MCHC: 31.2 g/dL (ref 30.0–36.0)
MCV: 81 fL (ref 80.0–100.0)
Monocytes Absolute: 0.4 10*3/uL (ref 0.1–1.0)
Monocytes Relative: 4 %
Neutro Abs: 9.1 10*3/uL — ABNORMAL HIGH (ref 1.7–7.7)
Neutrophils Relative %: 86 %
Platelets: 588 10*3/uL — ABNORMAL HIGH (ref 150–400)
RBC: 5.11 MIL/uL (ref 3.87–5.11)
RDW: 14.4 % (ref 11.5–15.5)
WBC: 10.4 10*3/uL (ref 4.0–10.5)
nRBC: 0 % (ref 0.0–0.2)

## 2020-03-05 LAB — COMPREHENSIVE METABOLIC PANEL
ALT: 14 U/L (ref 0–44)
AST: 21 U/L (ref 15–41)
Albumin: 2.7 g/dL — ABNORMAL LOW (ref 3.5–5.0)
Alkaline Phosphatase: 78 U/L (ref 38–126)
Anion gap: 18 — ABNORMAL HIGH (ref 5–15)
BUN: 41 mg/dL — ABNORMAL HIGH (ref 6–20)
CO2: 15 mmol/L — ABNORMAL LOW (ref 22–32)
Calcium: 9.4 mg/dL (ref 8.9–10.3)
Chloride: 106 mmol/L (ref 98–111)
Creatinine, Ser: 5.35 mg/dL — ABNORMAL HIGH (ref 0.44–1.00)
GFR, Estimated: 10 mL/min — ABNORMAL LOW (ref >60)
Glucose, Bld: 262 mg/dL — ABNORMAL HIGH (ref 70–99)
Potassium: 3.6 mmol/L (ref 3.5–5.1)
Sodium: 139 mmol/L (ref 135–145)
Total Bilirubin: 0.7 mg/dL (ref 0.3–1.2)
Total Protein: 7.4 g/dL (ref 6.5–8.1)

## 2020-03-05 LAB — GLUCOSE, CAPILLARY
Glucose-Capillary: 233 mg/dL — ABNORMAL HIGH (ref 70–99)
Glucose-Capillary: 245 mg/dL — ABNORMAL HIGH (ref 70–99)
Glucose-Capillary: 235 mg/dL — ABNORMAL HIGH (ref 70–99)
Glucose-Capillary: 167 mg/dL — ABNORMAL HIGH (ref 70–99)
Glucose-Capillary: 170 mg/dL — ABNORMAL HIGH (ref 70–99)
Glucose-Capillary: 175 mg/dL — ABNORMAL HIGH (ref 70–99)
Glucose-Capillary: 190 mg/dL — ABNORMAL HIGH (ref 70–99)
Glucose-Capillary: 152 mg/dL — ABNORMAL HIGH (ref 70–99)
Glucose-Capillary: 173 mg/dL — ABNORMAL HIGH (ref 70–99)
Glucose-Capillary: 128 mg/dL — ABNORMAL HIGH (ref 70–99)
Glucose-Capillary: 155 mg/dL — ABNORMAL HIGH (ref 70–99)
Glucose-Capillary: 207 mg/dL — ABNORMAL HIGH (ref 70–99)
Glucose-Capillary: 239 mg/dL — ABNORMAL HIGH (ref 70–99)

## 2020-03-05 LAB — MRSA PCR SCREENING: MRSA by PCR: NEGATIVE

## 2020-03-05 LAB — MAGNESIUM
Magnesium: 1.6 mg/dL — ABNORMAL LOW (ref 1.7–2.4)
Magnesium: 2 mg/dL (ref 1.7–2.4)
Magnesium: 1.9 mg/dL (ref 1.7–2.4)

## 2020-03-05 LAB — LACTIC ACID, PLASMA
Lactic Acid, Venous: 2.6 mmol/L (ref 0.5–1.9)
Lactic Acid, Venous: 2 mmol/L (ref 0.5–1.9)

## 2020-03-05 LAB — BETA-HYDROXYBUTYRIC ACID
Beta-Hydroxybutyric Acid: 2.21 mmol/L — ABNORMAL HIGH (ref 0.05–0.27)
Beta-Hydroxybutyric Acid: 0.24 mmol/L (ref 0.05–0.27)

## 2020-03-05 LAB — CBG MONITORING, ED: Glucose-Capillary: 241 mg/dL — ABNORMAL HIGH (ref 70–99)

## 2020-03-05 MED ORDER — DEXTROSE IN LACTATED RINGERS 5 % IV SOLN: INTRAVENOUS | Status: AC

## 2020-03-05 MED ORDER — CLONIDINE HCL 0.1 MG PO TABS: 0.1000 mg | ORAL_TABLET | Freq: Two times a day (BID) | ORAL | Status: DC

## 2020-03-05 MED ORDER — CLONIDINE HCL 0.2 MG/24HR TD PTWK
0.2000 mg | MEDICATED_PATCH | TRANSDERMAL | Status: DC
  Administered 2020-03-05: 0.2 mg via TRANSDERMAL
  Filled 2020-03-05: qty 1

## 2020-03-05 MED ORDER — LORAZEPAM 2 MG/ML IJ SOLN: 1.0000 mg | Freq: Four times a day (QID) | INTRAMUSCULAR | Status: DC | PRN

## 2020-03-05 MED ORDER — INSULIN ASPART 100 UNIT/ML ~~LOC~~ SOLN
3.0000 [IU] | Freq: Three times a day (TID) | SUBCUTANEOUS | Status: DC
  Administered 2020-03-06: 3 [IU] via SUBCUTANEOUS

## 2020-03-05 MED ORDER — CHLORHEXIDINE GLUCONATE CLOTH 2 % EX PADS
6.0000 | MEDICATED_PAD | Freq: Every day | CUTANEOUS | Status: DC
  Administered 2020-03-05 – 2020-03-07 (×3): 6 via TOPICAL

## 2020-03-05 MED ORDER — CARVEDILOL 25 MG PO TABS
25.0000 mg | ORAL_TABLET | Freq: Two times a day (BID) | ORAL | Status: DC
  Administered 2020-03-05 – 2020-03-08 (×7): 25 mg via ORAL
  Filled 2020-03-05: qty 2
  Filled 2020-03-05 (×2): qty 1
  Filled 2020-03-05: qty 2
  Filled 2020-03-05: qty 1
  Filled 2020-03-05: qty 2
  Filled 2020-03-05: qty 1

## 2020-03-05 MED ORDER — INSULIN ASPART 100 UNIT/ML ~~LOC~~ SOLN
0.0000 [IU] | Freq: Three times a day (TID) | SUBCUTANEOUS | Status: DC
  Administered 2020-03-05: 1 [IU] via SUBCUTANEOUS
  Administered 2020-03-05: 2 [IU] via SUBCUTANEOUS
  Administered 2020-03-05 – 2020-03-06 (×2): 3 [IU] via SUBCUTANEOUS
  Administered 2020-03-06: 17:00:00 1 [IU] via SUBCUTANEOUS
  Administered 2020-03-06: 2 [IU] via SUBCUTANEOUS
  Administered 2020-03-07: 5 [IU] via SUBCUTANEOUS
  Administered 2020-03-07: 3 [IU] via SUBCUTANEOUS
  Administered 2020-03-07: 2 [IU] via SUBCUTANEOUS
  Administered 2020-03-08: 08:00:00 5 [IU] via SUBCUTANEOUS

## 2020-03-05 MED ORDER — INSULIN ASPART 100 UNIT/ML ~~LOC~~ SOLN
0.0000 [IU] | Freq: Every day | SUBCUTANEOUS | Status: DC
  Administered 2020-03-05: 2 [IU] via SUBCUTANEOUS
  Administered 2020-03-06: 1 [IU] via SUBCUTANEOUS

## 2020-03-05 MED ORDER — LATANOPROST 0.005 % OP SOLN
1.0000 [drp] | Freq: Every day | OPHTHALMIC | Status: DC
  Administered 2020-03-05 – 2020-03-07 (×3): 1 [drp] via OPHTHALMIC
  Filled 2020-03-05 (×2): qty 2.5

## 2020-03-05 MED ORDER — POTASSIUM CHLORIDE CRYS ER 20 MEQ PO TBCR
40.0000 meq | EXTENDED_RELEASE_TABLET | Freq: Once | ORAL | Status: AC
  Administered 2020-03-05: 40 meq via ORAL
  Filled 2020-03-05: qty 2

## 2020-03-05 MED ORDER — LABETALOL HCL 5 MG/ML IV SOLN
10.0000 mg | INTRAVENOUS | Status: DC | PRN
  Administered 2020-03-05 – 2020-03-06 (×3): 10 mg via INTRAVENOUS
  Filled 2020-03-05 (×4): qty 4

## 2020-03-05 MED ORDER — LORAZEPAM 2 MG/ML IJ SOLN
1.0000 mg | Freq: Four times a day (QID) | INTRAMUSCULAR | Status: DC | PRN
  Administered 2020-03-05: 1 mg via INTRAVENOUS
  Filled 2020-03-05: qty 1

## 2020-03-05 MED ORDER — TRAZODONE HCL 50 MG PO TABS
50.0000 mg | ORAL_TABLET | Freq: Every day | ORAL | Status: DC
  Administered 2020-03-05 – 2020-03-07 (×3): 50 mg via ORAL
  Filled 2020-03-05 (×3): qty 1

## 2020-03-05 MED ORDER — METOCLOPRAMIDE HCL 5 MG/ML IJ SOLN: 5.0000 mg | Freq: Four times a day (QID) | INTRAMUSCULAR | Status: DC

## 2020-03-05 MED ORDER — LABETALOL HCL 5 MG/ML IV SOLN
0.5000 mg/min | Status: DC
  Administered 2020-03-05: 1 mg/min via INTRAVENOUS
  Administered 2020-03-05: 0.5 mg/min via INTRAVENOUS
  Filled 2020-03-05 (×3): qty 80

## 2020-03-05 MED ORDER — MAGNESIUM SULFATE IN D5W 1-5 GM/100ML-% IV SOLN
1.0000 g | Freq: Once | INTRAVENOUS | Status: AC
  Administered 2020-03-05: 1 g via INTRAVENOUS
  Filled 2020-03-05: qty 100

## 2020-03-05 MED ORDER — METOCLOPRAMIDE HCL 5 MG/ML IJ SOLN
10.0000 mg | Freq: Four times a day (QID) | INTRAMUSCULAR | Status: DC
  Administered 2020-03-05 – 2020-03-06 (×3): 10 mg via INTRAVENOUS
  Filled 2020-03-05 (×3): qty 2

## 2020-03-05 MED ORDER — PANTOPRAZOLE SODIUM 40 MG PO TBEC
40.0000 mg | DELAYED_RELEASE_TABLET | Freq: Every day | ORAL | Status: DC
  Administered 2020-03-05 – 2020-03-08 (×4): 40 mg via ORAL
  Filled 2020-03-05 (×4): qty 1

## 2020-03-05 MED ORDER — INSULIN GLARGINE 100 UNIT/ML ~~LOC~~ SOLN
6.0000 [IU] | Freq: Every day | SUBCUTANEOUS | Status: DC
  Administered 2020-03-05 – 2020-03-07 (×3): 6 [IU] via SUBCUTANEOUS
  Filled 2020-03-05 (×3): qty 0.06

## 2020-03-05 MED ORDER — HYDRALAZINE HCL 50 MG PO TABS
50.0000 mg | ORAL_TABLET | Freq: Three times a day (TID) | ORAL | Status: DC
  Administered 2020-03-05 – 2020-03-06 (×6): 50 mg via ORAL
  Filled 2020-03-05 (×7): qty 1

## 2020-03-05 MED ORDER — METOCLOPRAMIDE HCL 5 MG/ML IJ SOLN
10.0000 mg | Freq: Four times a day (QID) | INTRAMUSCULAR | Status: DC
  Administered 2020-03-05 (×2): 10 mg via INTRAVENOUS
  Filled 2020-03-05 (×2): qty 2

## 2020-03-05 MED ORDER — SERTRALINE HCL 100 MG PO TABS
100.0000 mg | ORAL_TABLET | Freq: Every morning | ORAL | Status: DC
  Administered 2020-03-05 – 2020-03-08 (×4): 100 mg via ORAL
  Filled 2020-03-05 (×4): qty 1

## 2020-03-05 MED ORDER — SODIUM BICARBONATE 650 MG PO TABS
325.0000 mg | ORAL_TABLET | Freq: Four times a day (QID) | ORAL | Status: DC
  Administered 2020-03-05 – 2020-03-08 (×13): 325 mg via ORAL
  Filled 2020-03-05 (×2): qty 1
  Filled 2020-03-05 (×3): qty 0.5
  Filled 2020-03-05 (×2): qty 1
  Filled 2020-03-05 (×3): qty 0.5
  Filled 2020-03-05: qty 1
  Filled 2020-03-05 (×3): qty 0.5

## 2020-03-05 NOTE — Progress Notes (Signed)
Inpatient Diabetes Program Recommendations  AACE/ADA: New Consensus Statement on Inpatient Glycemic Control (2015)  Target Ranges:  Prepandial:   less than 140 mg/dL      Peak postprandial:   less than 180 mg/dL (1-2 hours)      Critically ill patients:  140 - 180 mg/dL   Lab Results  Component Value Date   GLUCAP 152 (H) 03/05/2020   HGBA1C 6.9 (H) 02/09/2020    Diabetes history:  T1DM Outpatient Diabetes medications:  Lantus 8 units daily Humalog 4 units TID Current orders for Inpatient glycemic control:  IV insulin  Inpatient Diabetes Program Recommendations:    When MD is ready to transition to sq insulin please consider,  Lantus 6 units daily (1-2 hrs prior to discontinuing drip) Novolog 3 units TID with meals if eats at least 50%  Novolog 0-9 units TID & 0-5 QHS  Spoke with patient at bedside.  She states she sees Dr. Loanne Drilling for her DM management.  Last saw him in December.  She confirms above home medications.  Denies difficulty obtaining medications.  She wears a [employer]estyle Libre CGM.  Denies recent episodes of hypoglycemia. She recently transitioned from an insulin pump to basal bolus due to lows in January.  She states current regimen is working well for her.  She believes her gastroparesis is what caused this admissions.    Will continue to follow while inpatient.  Thank you, Reche Dixon, RN, BSN Diabetes Coordinator Inpatient Diabetes Program [phone] (team pager from 8a-5p)

## 2020-03-05 NOTE — Progress Notes (Addendum)
Spoke with RN regarding IV access. Recommend CVAD if any future additional access is needed due to limited veins. Pt not appropriate for midline.

## 2020-03-05 NOTE — Progress Notes (Signed)
QTC is 0,5, notified Dr. Cyd Silence as to what to do with the Reglan. MD indicated to go ahead an administer it. Will give and continue to monitor.

## 2020-03-05 NOTE — Progress Notes (Signed)
[place] NP called back stated I should call the admitting provider since there's no H & P notes for her to read and prescribe. Will notify the admitting MD to prescribe.

## 2020-03-05 NOTE — TOC Initial Note (Signed)
Transition of Care [facility]) - Initial/Assessment Note    Patient Details  Name: Barbara Donovan MRN: [id number] Date of Birth: January 20, 1990  Transition of Care [facility]) CM/SW Contact:    Leeroy Cha, RN Phone Number: 03/05/2020, 8:25 AM  Clinical Narrative:                 30 year old female with past medical history of diabetes mellitus type 1, gastroparesis, chronic kidney disease stage V, anemia of chronic disease and hypertension who presents to [facility] emergency department with complaints of nausea and vomiting.  Patient explains that yesterday patient began to experience sudden onset of abdominal pain.  Patient describes his abdominal pain as mainly located in her lower abdomen radiating diffusely.  Abdominal pain is crampy, 8 out of 10 in intensity and waxes and wanes in intensity.  Approximately the same time patient also began to experience intense nausea and frequent bouts of nonbilious nonbloody vomiting.  Symptoms continue to persist over the following 24 h.  Because of patient's ongoing symptoms of nausea and vomiting patient has been unable to tolerate any food or drink.  Patient has been unable to take any of her oral medications.  Due to progressive symptoms patient is also began to experience generalized malaise and weakness.  Due to progressively worsening symptoms the patient eventually presented to [facility] emergency department for evaluation.  Upon evaluation in the emergency department, patient was found to be exhibiting intractable nausea and vomiting.  Symptoms persisted despite administration of several agents including droperidol and Phenergan.  Patient was also noted to be suffering from substantial hypertensive urgency with blood pressures as high as 229/111. plan to return to home  Expected Discharge Plan: Home/Self Care Barriers to Discharge: Continued Medical Work up   Patient Goals and CMS Choice Patient states their goals for  this hospitalization and ongoing recovery are:: to go home CMS Medicare.gov Compare Post Acute Care list provided to:: Patient    Expected Discharge Plan and Services Expected Discharge Plan: Home/Self Care   Discharge Planning Services: CM Consult   Living arrangements for the past 2 months: Apartment                                      Prior Living Arrangements/Services Living arrangements for the past 2 months: Apartment Lives with:: Significant Other Patient language and need for interpreter reviewed:: Yes Do you feel safe going back to the place where you live?: Yes      Need for Family Participation in Patient Care: Yes (Comment) Care giver support system in place?: Yes (comment)   Criminal Activity/Legal Involvement Pertinent to Current Situation/Hospitalization: No - Comment as needed  Activities of Daily Living Home Assistive Devices/Equipment: CBG Meter,Insulin Pump ADL Screening (condition at time of admission) Patient's cognitive ability adequate to safely complete daily activities?: Yes Is the patient deaf or have difficulty hearing?: No Does the patient have difficulty seeing, even when wearing glasses/contacts?: No Does the patient have difficulty concentrating, remembering, or making decisions?: No Patient able to express need for assistance with ADLs?: Yes Does the patient have difficulty dressing or bathing?: No Independently performs ADLs?: Yes (appropriate for developmental age) Does the patient have difficulty walking or climbing stairs?: No Weakness of Legs: None Weakness of Arms/Hands: None  Permission Sought/Granted                  Emotional  Assessment Appearance:: Appears stated age Attitude/Demeanor/Rapport: Engaged Affect (typically observed): Calm Orientation: : Oriented to Self,Oriented to Place,Oriented to  Time,Oriented to Situation Alcohol / Substance Use: Not Applicable Psych Involvement: No (comment)  Admission  diagnosis:  Shortness of breath [R06.02] Hypertensive urgency [I16.0] Intractable nausea and vomiting [R11.2] Diabetic ketoacidosis without coma associated with type 1 diabetes mellitus ([place]) [E10.10] Intractable vomiting with nausea [R11.2] Patient Active Problem List   Diagnosis Date Noted  . Diabetic gastroparesis ([place]) 03/04/2020  . Anemia due to stage 5 chronic kidney disease ([place]) 03/04/2020  . Diabetic ketoacidosis without coma associated with type 1 diabetes mellitus ([place]) 02/09/2020  . Hypothermia 01/24/2020  . DKA (diabetic ketoacidoses) 09/14/2019  . COVID-19 virus infection 09/14/2019  . SOB (shortness of breath) 03/27/2018  . Hypomagnesemia 01/28/2018  . Dehydration 01/28/2018  . Intractable nausea and vomiting 01/22/2018  . Hypertensive urgency 01/22/2018  . Hypokalemia 01/22/2018  . 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 Proteinuria due to type 1 diabetes mellitus ([place]) 11/02/2017  . Chronic hypertension 08/21/2017  . Uncontrolled type 1 diabetes mellitus with stage 5 chronic kidney disease not on chronic dialysis, with long-term current use of insulin ([place]) 07/07/2017  . Diabetic retinopathy ([place]) 06/09/2017  . Acute depression 11/02/2014  . Asthma 10/30/2013   PCP:  Fanny Bien, MD Pharmacy:   [id number] [place]Phone: [phone]: [phone]    Social Determinants of Health (SDOH) Interventions    Readmission Risk Interventions No flowsheet data found.

## 2020-03-05 NOTE — ED Notes (Signed)
Report called to [facility].

## 2020-03-05 NOTE — Progress Notes (Signed)
Patient's BP still elevated and having N/V and dry heaves at times. Paged [place] NP and awaiting new orders.

## 2020-03-05 NOTE — Progress Notes (Addendum)
PROGRESS NOTE  Barbara Donovan [id number] DOB: May 15, 1990   PCP: Fanny Bien, MD  Patient is from: Home  DOA: 03/04/2020 LOS: 0  Chief complaints: Nausea/vomiting/abdominal pain  Brief Narrative / Interim history: 30 year old female with history of IDDM-1, gastroparesis, CKD-5 and HTN presenting with nausea, vomiting and abdominal pain and admitted for DKA, hypertensive urgency and AKI on CKD-5.  Started on IV fluids, insulin drip and labetalol drip and admitted.  Subjective: Seen and examined earlier this morning.  No major events overnight.  Reports improvement in her nausea, vomiting and abdominal pain.  Has not had further emesis overnight.  Denies chest pain, respiratory, GI or UTI symptoms.    Objective: Vitals:   03/05/20 0950 03/05/20 1000 03/05/20 1051 03/05/20 1115  BP: 136/82 (!) 141/79 136/77 (!) 154/99  Pulse: 95 95 98 93  Resp: '13 14 17 15  '$ Temp:      TempSrc:      SpO2: 96% 96% 98% 99%  Weight:      Height:        Intake/Output Summary (Last 24 hours) at 03/05/2020 1144 Last data filed at 03/05/2020 1100 Gross per 24 hour  Intake 3250.43 ml  Output --  Net 3250.43 ml   Filed Weights   03/04/20 1329  Weight: 77.1 kg    Examination:  GENERAL: No apparent distress.  Nontoxic. HEENT: MMM.  Vision and hearing grossly intact.  NECK: Supple.  No apparent JVD.  RESP: On RA.  No IWOB.  Fair aeration bilaterally. CVS:  RRR. Heart sounds normal.  ABD/GI/GU: BS+. Abd soft, NTND.  MSK/EXT:  Moves extremities. No apparent deformity. No edema.  SKIN: no apparent skin lesion or wound NEURO: Awake, alert and oriented appropriately.  No apparent focal neuro deficit. PSYCH: Calm. Normal affect.   Procedures:  None  Microbiology summarized: [id number] and influenza PCR nonreactive.  Assessment & Plan: Uncontrolled DM-1 with diabetic ketoacidosis, CKD-5 and gastroparesis: Improving. Recent Labs  Lab 03/05/20 0608 03/05/20 0709 03/05/20 0816  03/05/20 0922 03/05/20 1002  GLUCAP 170* 175* 190* 152* 173*  -Transition to subcu insulin -Clear liquid diet. -Continue IV LR given AKI  Intractable nausea and vomiting: Likely due to the above.  There episodes of emesis through the day -Continue IV Reglan 10 mg every 6 hours and Zofran -IV fluid and as needed antiemetics -Clear liquid diet.  AKI on CKD-5/azotemia: Likely due to GI loss.  Creatinine seems to be rising.  Followed by Dr. Joylene Grapes outpatient.  No indication for urgent dialysis. Recent Labs    01/27/20 0608 02/09/20 0408 02/09/20 0808 02/09/20 1208 02/09/20 2040 02/10/20 0525 03/04/20 1330 03/04/20 2200 03/05/20 0256 03/05/20 0922  BUN 38* 33* 32* 32* 31* 25* 35* 39* 41* 41*  CREATININE 4.26* 4.25* 4.11* 4.73* 4.49* 4.87* 4.39* 4.93* 5.35* 5.47*  -Avoid nephrotoxic meds -IV fluid as above -Recheck in the morning  Hypertensive urgency: Improved. -Resume home medications -Change labetalol to as needed  Hypokalemia: K3.2. -Replenish cautiously and recheck  Body mass index is 27.44 kg/m.         DVT prophylaxis:  heparin injection 5,000 Units Start: 03/04/20 2200  Code Status: Full code Family Communication: Patient and/or RN. Available if any question.  Level of care: Stepdown. Will transfer to telemetry this afternoon if stable Status is: Observation  The patient will require care spanning > 2 midnights and should be moved to inpatient because: Hemodynamically unstable, Persistent severe electrolyte disturbances, IV treatments appropriate due to intensity of illness or inability  to take PO and Inpatient level of care appropriate due to severity of illness  Dispo: The patient is from: Home              Anticipated d/c is to: Home              Anticipated d/c date is: 1 day              Patient currently is not medically stable to d/c.   Difficult to place patient No       Consultants:  None   Sch Meds:  Scheduled Meds: . carvedilol  25  mg Oral BID  . Chlorhexidine Gluconate Cloth  6 each Topical Daily  . cloNIDine  0.2 mg Transdermal Weekly  . heparin injection (subcutaneous)  5,000 Units Subcutaneous Q8H  . hydrALAZINE  50 mg Oral TID  . insulin aspart  0-5 Units Subcutaneous QHS  . insulin aspart  0-9 Units Subcutaneous TID WC  . insulin aspart  3 Units Subcutaneous TID WC  . insulin glargine  6 Units Subcutaneous Daily  . latanoprost  1 drop Left Eye QHS  . metoCLOPramide (REGLAN) injection  10 mg Intravenous Q6H  . pantoprazole (PROTONIX) IV  40 mg Intravenous Q24H  . sertraline  100 mg Oral q morning  . sodium bicarbonate  325 mg Oral QID  . traZODone  50 mg Oral QHS   Continuous Infusions: . dextrose 5% lactated ringers    . insulin 3.2 mL/hr at 03/05/20 1100  . labetalol (NORMODYNE) infusion 5 mg/mL 0.5 mg/min (03/05/20 1100)  . lactated ringers 125 mL/hr at 03/05/20 0609   PRN Meds:.acetaminophen **OR** acetaminophen, dextrose, LORazepam, ondansetron **OR** ondansetron (ZOFRAN) IV, polyethylene glycol  Antimicrobials: Anti-infectives (From admission, onward)   None       I have personally reviewed the following labs and images: CBC: Recent Labs  Lab 03/04/20 1330 03/05/20 0256  WBC 9.9 10.4  NEUTROABS  --  9.1*  HGB 12.9 12.9  HCT 39.5 41.4  MCV 79.5* 81.0  PLT 560* 588*   BMP &GFR Recent Labs  Lab 03/04/20 1330 03/04/20 2200 03/05/20 0256 03/05/20 0922  NA 136 139 139 143  K 3.5 3.8 3.6 3.2*  CL 107 106 106 111  CO2 14* 15* 15* 19*  GLUCOSE 313* 334* 262* 207*  BUN 35* 39* 41* 41*  CREATININE 4.39* 4.93* 5.35* 5.47*  CALCIUM 8.1* 8.9 9.4 8.9  MG  --   --  1.6* 2.0  PHOS  --   --   --  3.2   Estimated Creatinine Clearance: 15.9 mL/min (A) (by C-G formula based on SCr of 5.47 mg/dL (H)). Liver & Pancreas: Recent Labs  Lab 03/04/20 1330 03/05/20 0256 03/05/20 0922  AST 17 21  --   ALT 14 14  --   ALKPHOS 69 78  --   BILITOT 0.6 0.7  --   PROT 6.3* 7.4  --   ALBUMIN  2.1* 2.7* 2.3*   Recent Labs  Lab 03/04/20 1330  LIPASE 28   No results for input(s): AMMONIA in the last 168 hours. Diabetic: No results for input(s): HGBA1C in the last 72 hours. Recent Labs  Lab 03/05/20 0608 03/05/20 0709 03/05/20 0816 03/05/20 0922 03/05/20 1002  GLUCAP 170* 175* 190* 152* 173*   Cardiac Enzymes: No results for input(s): CKTOTAL, CKMB, CKMBINDEX, TROPONINI in the last 168 hours. No results for input(s): PROBNP in the last 8760 hours. Coagulation Profile: No results for input(s): INR, PROTIME  in the last 168 hours. Thyroid Function Tests: No results for input(s): TSH, T4TOTAL, FREET4, T3FREE, THYROIDAB in the last 72 hours. Lipid Profile: No results for input(s): CHOL, HDL, LDLCALC, TRIG, CHOLHDL, LDLDIRECT in the last 72 hours. Anemia Panel: No results for input(s): VITAMINB12, FOLATE, FERRITIN, TIBC, IRON, RETICCTPCT in the last 72 hours. Urine analysis:    Component Value Date/Time   COLORURINE YELLOW 02/09/2020 0826   APPEARANCEUR HAZY (A) 02/09/2020 0826   LABSPEC 1.012 02/09/2020 0826   PHURINE 7.0 02/09/2020 0826   GLUCOSEU >=500 (A) 02/09/2020 0826   HGBUR SMALL (A) 02/09/2020 0826   BILIRUBINUR NEGATIVE 02/09/2020 0826   KETONESUR 20 (A) 02/09/2020 0826   PROTEINUR >=300 (A) 02/09/2020 0826   UROBILINOGEN 0.2 08/14/2019 1051   NITRITE NEGATIVE 02/09/2020 0826   LEUKOCYTESUR TRACE (A) 02/09/2020 0826   Sepsis Labs: Invalid input(s): PROCALCITONIN, [place]  Microbiology: Recent Results (from the past 240 hour(s))  Resp Panel by RT-PCR (Flu A&B, Covid) Nasopharyngeal Swab     Status: None   Collection Time: 03/04/20  7:30 PM   Specimen: Nasopharyngeal Swab; Nasopharyngeal(NP) swabs in vial transport medium  Result Value Ref Range Status   SARS Coronavirus 2 by RT PCR NEGATIVE NEGATIVE Final    Comment: (NOTE) SARS-CoV-2 target nucleic acids are NOT DETECTED.  The SARS-CoV-2 RNA is generally detectable in upper  respiratory specimens during the acute phase of infection. The lowest concentration of SARS-CoV-2 viral copies this assay can detect is 138 copies/mL. A negative result does not preclude SARS-Cov-2 infection and should not be used as the sole basis for treatment or other patient management decisions. A negative result may occur with  improper specimen collection/handling, submission of specimen other than nasopharyngeal swab, presence of viral mutation(s) within the areas targeted by this assay, and inadequate number of viral copies(<138 copies/mL). A negative result must be combined with clinical observations, patient history, and epidemiological information. The expected result is Negative.  Fact Sheet for Patients:  EntrepreneurPulse.com.au  Fact Sheet for Healthcare Providers:  IncredibleEmployment.be  This test is no t yet approved or cleared by the [place] FDA and  has been authorized for detection and/or diagnosis of SARS-CoV-2 by FDA under an Emergency Use Authorization (EUA). This EUA will remain  in effect (meaning this test can be used) for the duration of the COVID-19 declaration under Section 564(b)(1) of the Act, 21 U.S.C.section 360bbb-3(b)(1), unless the authorization is terminated  or revoked sooner.       Influenza A by PCR NEGATIVE NEGATIVE Final   Influenza B by PCR NEGATIVE NEGATIVE Final    Comment: (NOTE) The Xpert Xpress SARS-CoV-2/FLU/RSV plus assay is intended as an aid in the diagnosis of influenza from Nasopharyngeal swab specimens and should not be used as a sole basis for treatment. Nasal washings and aspirates are unacceptable for Xpert Xpress SARS-CoV-2/FLU/RSV testing.  Fact Sheet for Patients: EntrepreneurPulse.com.au  Fact Sheet for Healthcare Providers: IncredibleEmployment.be  This test is not yet approved or cleared by the [place] FDA and has been  authorized for detection and/or diagnosis of SARS-CoV-2 by FDA under an Emergency Use Authorization (EUA). This EUA will remain in effect (meaning this test can be used) for the duration of the COVID-19 declaration under Section 564(b)(1) of the Act, 21 U.S.C. section 360bbb-3(b)(1), unless the authorization is terminated or revoked.  Performed at [facility], [place] [address], [place], [place] [address]   MRSA PCR Screening     Status: None   Collection Time:  03/05/20  1:08 AM   Specimen: Nasopharyngeal  Result Value Ref Range Status   MRSA by PCR NEGATIVE NEGATIVE Final    Comment:        The GeneXpert MRSA Assay (FDA approved for NASAL specimens only), is one component of a comprehensive MRSA colonization surveillance program. It is not intended to diagnose MRSA infection nor to guide or monitor treatment for MRSA infections. Performed at [facility], [place] [address] [place], [place] [address]     Radiology Studies: DG Chest 1 View  Result Date: 03/04/2020 CLINICAL DATA:  Nausea, vomiting and shortness of breath times 24 hours. EXAM: CHEST  1 VIEW COMPARISON:  January 24, 2020 FINDINGS: The heart size and mediastinal contours are within normal limits. Both lungs are clear. The visualized skeletal structures are unremarkable. IMPRESSION: No active disease. Electronically Signed   By: Virgina Norfolk M.D.   On: 03/04/2020 21:12     [name] T. [place]  If 7PM-7AM, please contact night-coverage [email or web address] 03/05/2020, 11:44 AM

## 2020-03-05 NOTE — Plan of Care (Signed)
The patient is admitted to 1229 with the diagnosis of intractable N/V. A & O x$ Denied any acute pain. Patient having dry heaves and as result has caused her BP to be elevated. Hydralazine 10 Mg iv administered  as well as Zofran  4 mg PRN and awaiting a for result. Will continue to monitor.

## 2020-03-05 NOTE — Progress Notes (Addendum)
Dr. Cyd Silence notified of Lactic 2.6,  N/V/dry heaves/hiccupps and high BP. See new orders in epic. Will implement orders as received.

## 2020-03-06 ENCOUNTER — Inpatient Hospital Stay (HOSPITAL_COMMUNITY): Payer: 59

## 2020-03-06 DIAGNOSIS — N179 Acute kidney failure, unspecified: Secondary | ICD-10-CM | POA: Diagnosis not present

## 2020-03-06 DIAGNOSIS — M6282 Rhabdomyolysis: Secondary | ICD-10-CM

## 2020-03-06 DIAGNOSIS — I160 Hypertensive urgency: Secondary | ICD-10-CM | POA: Diagnosis not present

## 2020-03-06 DIAGNOSIS — N185 Chronic kidney disease, stage 5: Secondary | ICD-10-CM | POA: Diagnosis not present

## 2020-03-06 DIAGNOSIS — R112 Nausea with vomiting, unspecified: Secondary | ICD-10-CM | POA: Diagnosis not present

## 2020-03-06 LAB — URINALYSIS, ROUTINE W REFLEX MICROSCOPIC
Bilirubin Urine: NEGATIVE
Glucose, UA: >=500 mg/dL — AB
Hgb urine dipstick: NEGATIVE
Ketones, ur: NEGATIVE mg/dL
Leukocytes,Ua: NEGATIVE
Nitrite: NEGATIVE
Protein, ur: >=300 mg/dL — AB
Specific Gravity, Urine: 1.008 (ref 1.005–1.030)
pH: 7 (ref 5.0–8.0)

## 2020-03-06 LAB — RENAL FUNCTION PANEL
Albumin: 2.3 g/dL — ABNORMAL LOW (ref 3.5–5.0)
Anion gap: 10 (ref 5–15)
BUN: 41 mg/dL — ABNORMAL HIGH (ref 6–20)
CO2: 19 mmol/L — ABNORMAL LOW (ref 22–32)
Calcium: 8.3 mg/dL — ABNORMAL LOW (ref 8.9–10.3)
Chloride: 108 mmol/L (ref 98–111)
Creatinine, Ser: 6.23 mg/dL — ABNORMAL HIGH (ref 0.44–1.00)
GFR, Estimated: 9 mL/min — ABNORMAL LOW (ref >60)
Glucose, Bld: 221 mg/dL — ABNORMAL HIGH (ref 70–99)
Phosphorus: 5.4 mg/dL — ABNORMAL HIGH (ref 2.5–4.6)
Potassium: 4.2 mmol/L (ref 3.5–5.1)
Sodium: 137 mmol/L (ref 135–145)

## 2020-03-06 LAB — CREATININE, URINE, RANDOM: Creatinine, Urine: 71.33 mg/dL

## 2020-03-06 LAB — CBC
HCT: 34.4 % — ABNORMAL LOW (ref 36.0–46.0)
Hemoglobin: 10.8 g/dL — ABNORMAL LOW (ref 12.0–15.0)
MCH: 25.9 pg — ABNORMAL LOW (ref 26.0–34.0)
MCHC: 31.4 g/dL (ref 30.0–36.0)
MCV: 82.5 fL (ref 80.0–100.0)
Platelets: 425 10*3/uL — ABNORMAL HIGH (ref 150–400)
RBC: 4.17 MIL/uL (ref 3.87–5.11)
RDW: 14.7 % (ref 11.5–15.5)
WBC: 12 10*3/uL — ABNORMAL HIGH (ref 4.0–10.5)
nRBC: 0 % (ref 0.0–0.2)

## 2020-03-06 LAB — URIC ACID: Uric Acid, Serum: 6.9 mg/dL (ref 2.5–7.1)

## 2020-03-06 LAB — GLUCOSE, CAPILLARY
Glucose-Capillary: 189 mg/dL — ABNORMAL HIGH (ref 70–99)
Glucose-Capillary: 212 mg/dL — ABNORMAL HIGH (ref 70–99)
Glucose-Capillary: 130 mg/dL — ABNORMAL HIGH (ref 70–99)
Glucose-Capillary: 157 mg/dL — ABNORMAL HIGH (ref 70–99)

## 2020-03-06 LAB — CK: Total CK: 645 U/L — ABNORMAL HIGH (ref 38–234)

## 2020-03-06 LAB — SODIUM, URINE, RANDOM: Sodium, Ur: 35 mmol/L

## 2020-03-06 LAB — MAGNESIUM: Magnesium: 2.1 mg/dL (ref 1.7–2.4)

## 2020-03-06 IMAGING — US US RENAL
1 series · 14 of 25 positions shown · non-contrast
Comparison: [DATE]

CLINICAL DATA: Acute kidney injury

EXAM:
RENAL / URINARY TRACT ULTRASOUND COMPLETE

[Series 1: us renal · 35 acquisitions, 14 frames shown]
[im 1/35]
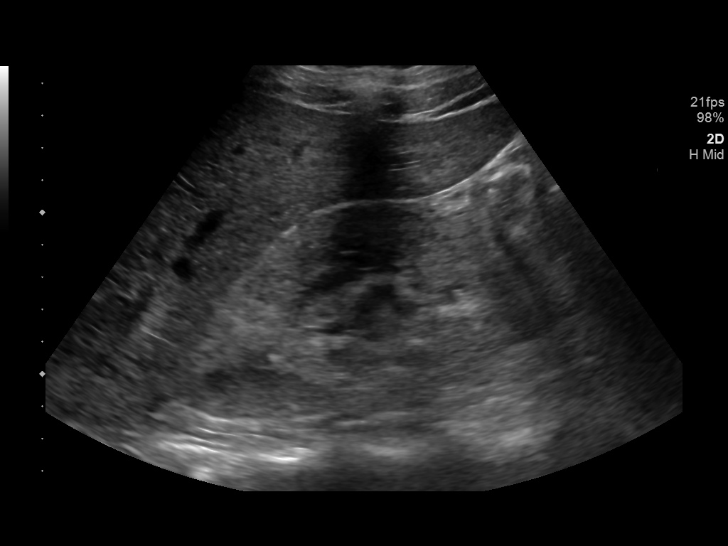
[im 3/35]
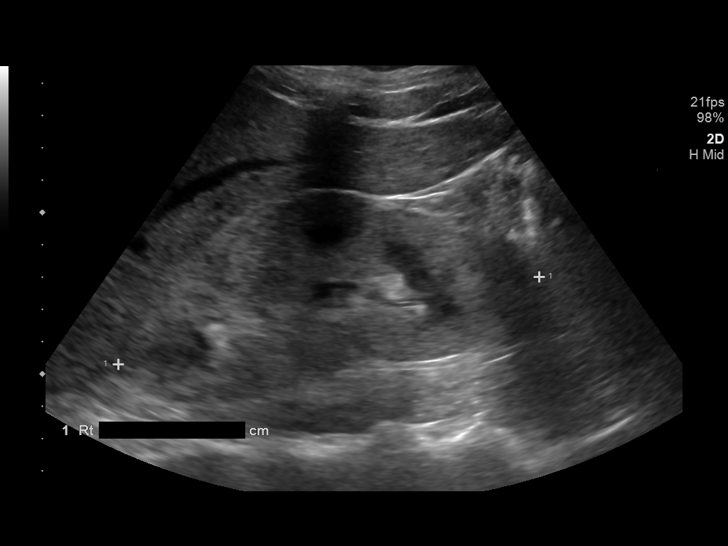
[im 6/35]
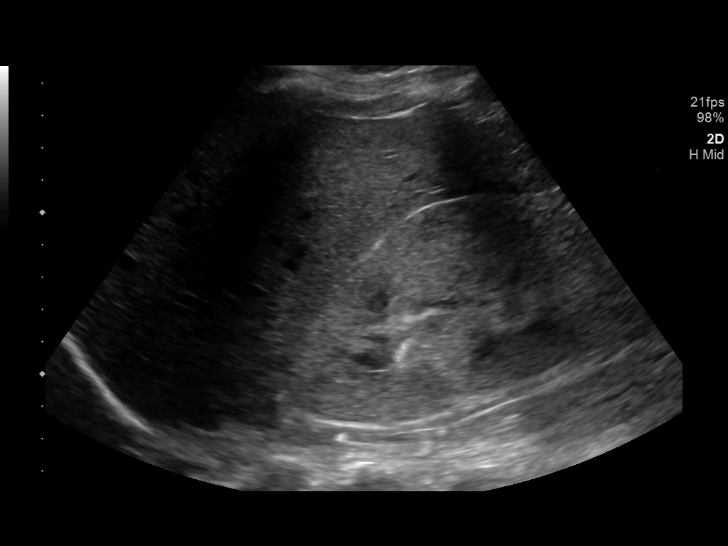
[im 9/35]
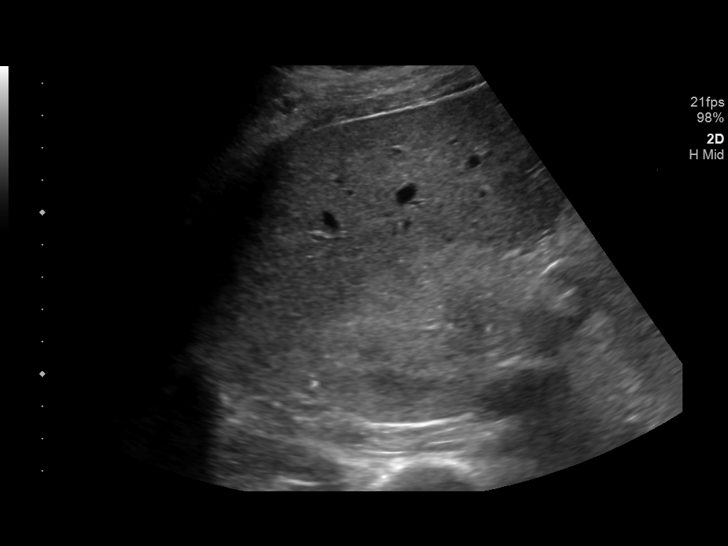
[im 12/35]
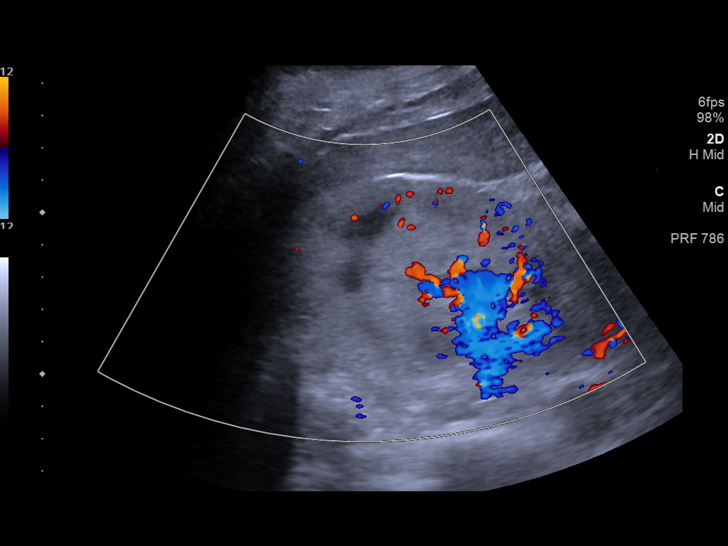
[im 13/35]
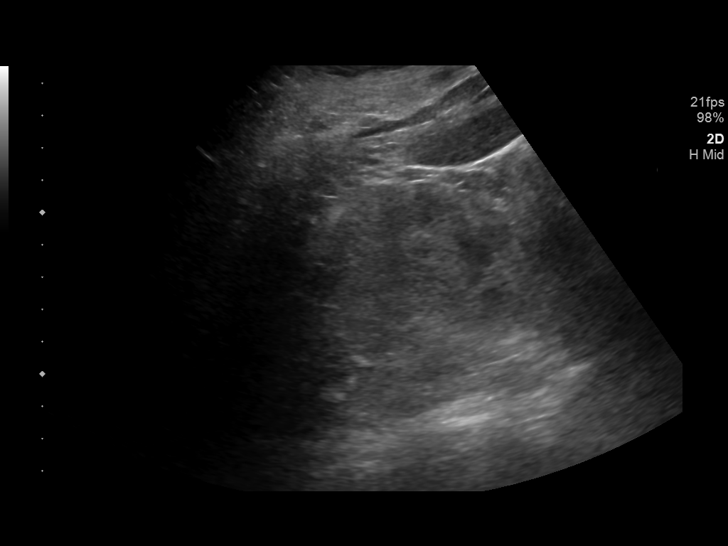
[im 16/35]
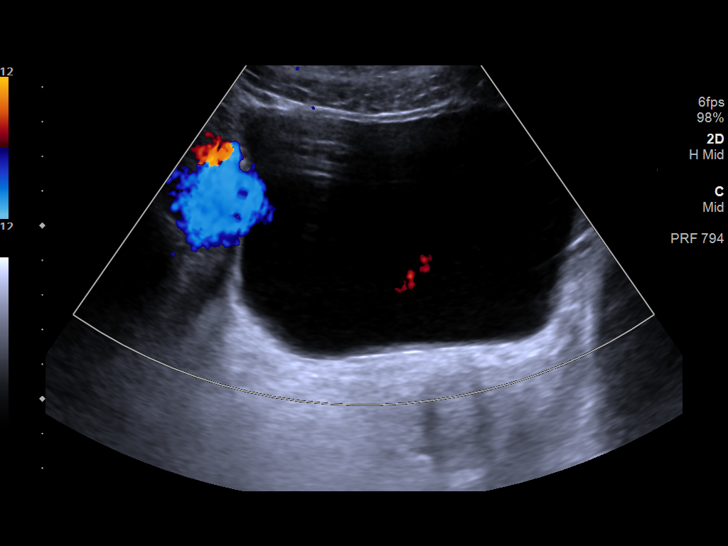
[im 19/35]
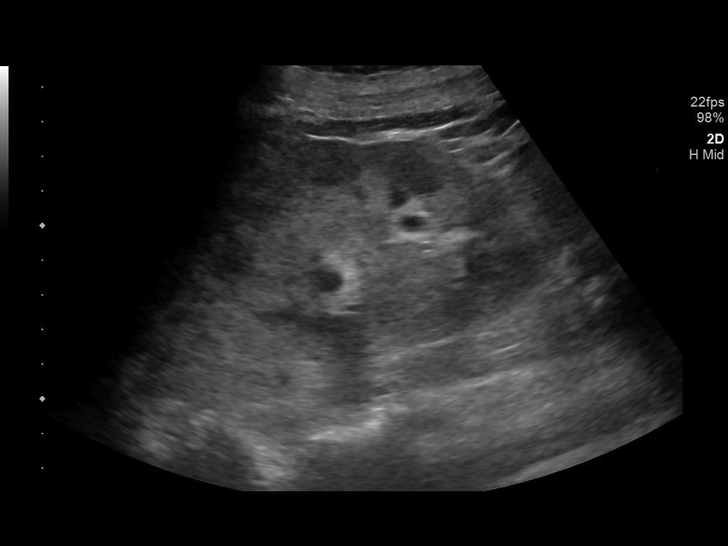
[im 22/35]
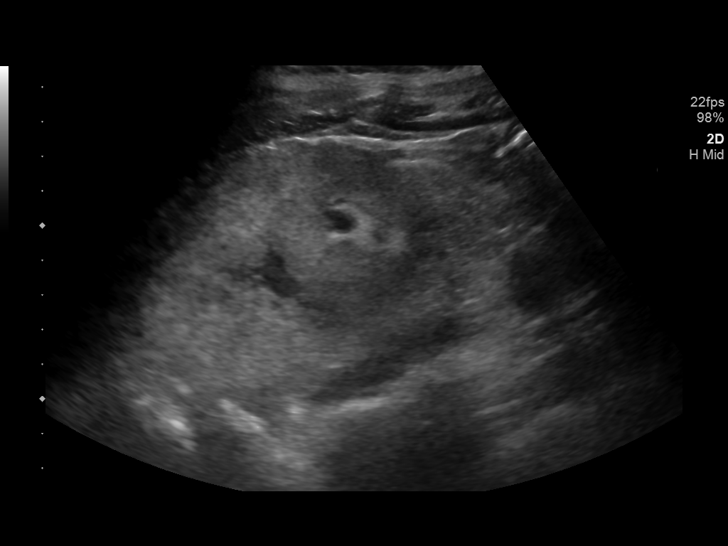
[im 23/35]
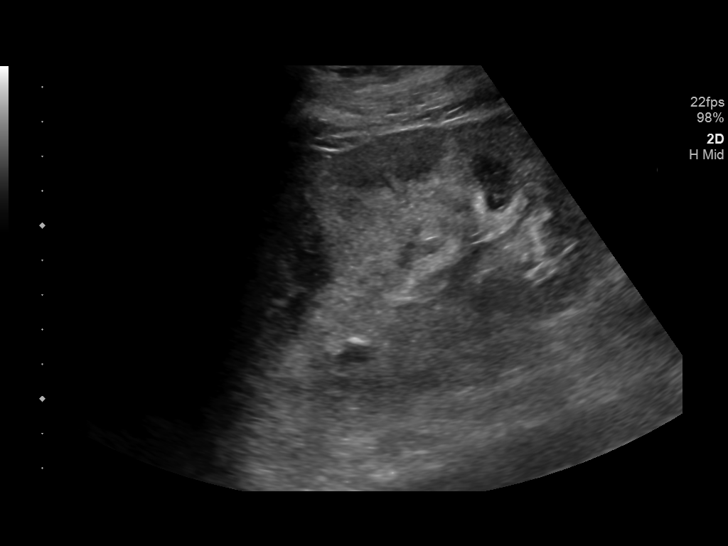
[im 26/35]
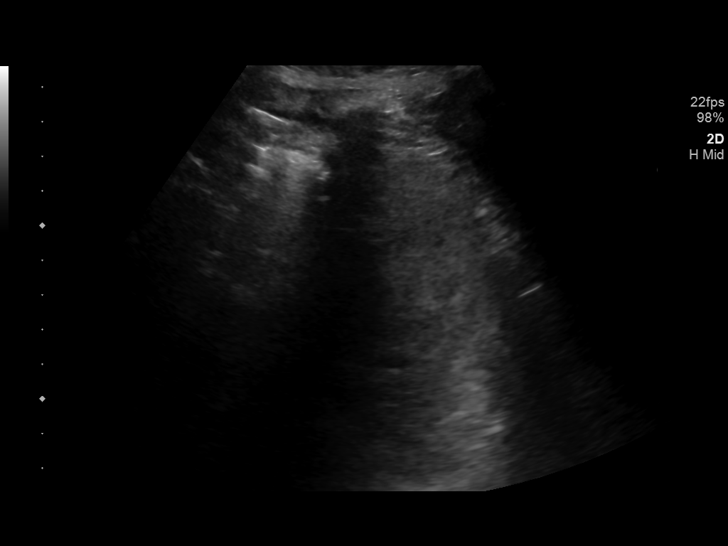
[im 29/35]
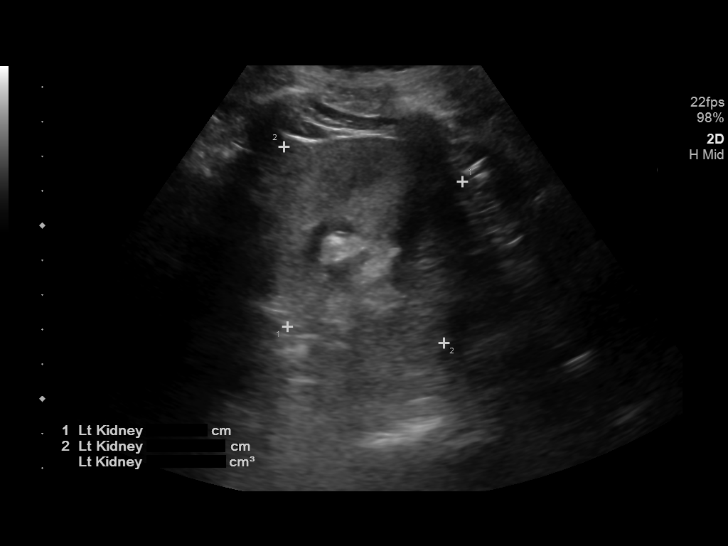
[im 32/35]
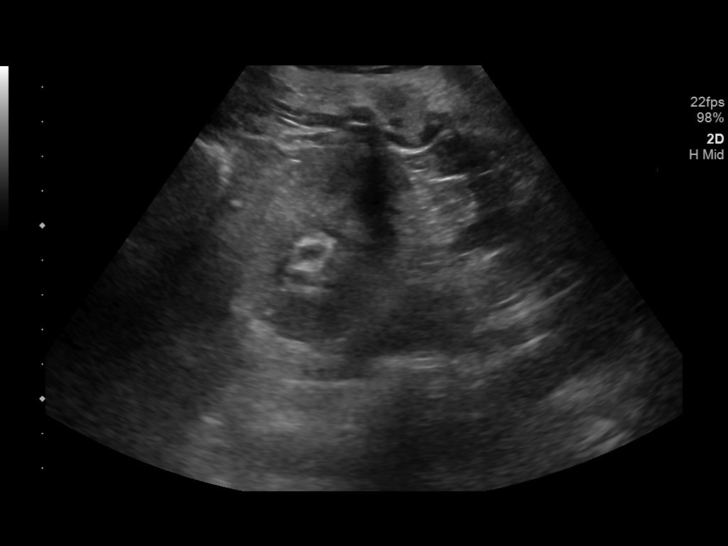
[im 35/35]
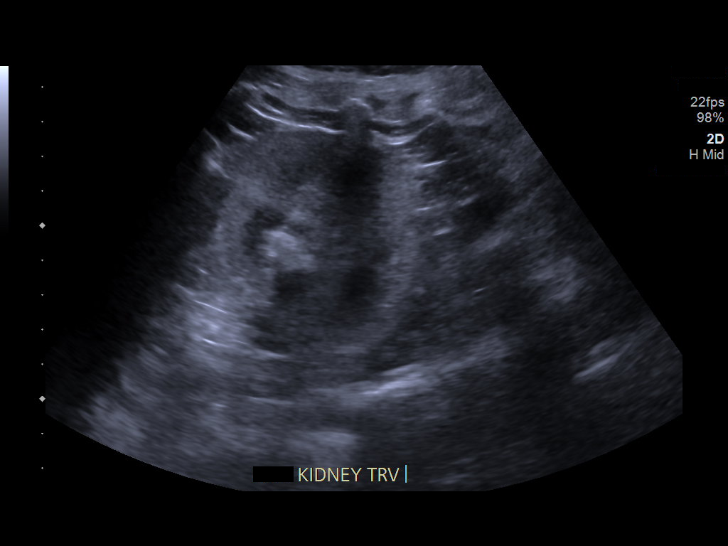

[14 of 25 positions shown; findings below may reference images not displayed]

FINDINGS: Right Kidney:

Renal measurements: 13.3 x 7.3 x 7.4 cm = volume: 375 mL. Normal
cortical thickness. Increased cortical echogenicity. No mass,
hydronephrosis, or shadowing calcification.

Left Kidney:

Renal measurements: 14.2 x 6.6 x 7.3 cm = volume: 356 mL. Normal
cortical thickness. Increased cortical echogenicity. Hyperechoic
focus mid LEFT kidney centrally, 14 mm diameter, mildly shadowing,
likely nonobstructing calculus. No mass or hydronephrosis.

Bladder:

Appears normal for degree of bladder distention. BILATERAL ureteral
jets noted.

Other:

N/A
IMPRESSION: Probable 14 mm nonobstructing calculus mid LEFT kidney.

Medical renal disease changes of both kidneys.

No evidence of renal mass or hydronephrosis.

## 2020-03-06 MED ORDER — DILTIAZEM HCL ER COATED BEADS 240 MG PO CP24
240.0000 mg | ORAL_CAPSULE | Freq: Every day | ORAL | Status: DC
  Administered 2020-03-06: 240 mg via ORAL
  Filled 2020-03-06: qty 2
  Filled 2020-03-06: qty 1

## 2020-03-06 MED ORDER — PROSOURCE PLUS PO LIQD
30.0000 mL | Freq: Two times a day (BID) | ORAL | Status: DC
  Administered 2020-03-06 – 2020-03-08 (×4): 30 mL via ORAL
  Filled 2020-03-06 (×4): qty 30

## 2020-03-06 MED ORDER — RENA-VITE PO TABS
1.0000 | ORAL_TABLET | Freq: Every day | ORAL | Status: DC
  Administered 2020-03-06 – 2020-03-07 (×2): 1 via ORAL
  Filled 2020-03-06 (×2): qty 1

## 2020-03-06 MED ORDER — INSULIN ASPART 100 UNIT/ML ~~LOC~~ SOLN
5.0000 [IU] | Freq: Three times a day (TID) | SUBCUTANEOUS | Status: DC
  Administered 2020-03-06 – 2020-03-08 (×2): 5 [IU] via SUBCUTANEOUS

## 2020-03-06 MED ORDER — CLONIDINE HCL 0.1 MG PO TABS
0.2000 mg | ORAL_TABLET | Freq: Two times a day (BID) | ORAL | Status: DC
  Administered 2020-03-06 – 2020-03-08 (×5): 0.2 mg via ORAL
  Filled 2020-03-06 (×5): qty 2

## 2020-03-06 MED ORDER — METOCLOPRAMIDE HCL 5 MG/ML IJ SOLN
5.0000 mg | Freq: Four times a day (QID) | INTRAMUSCULAR | Status: DC
  Administered 2020-03-06 – 2020-03-07 (×5): 5 mg via INTRAVENOUS
  Filled 2020-03-06 (×7): qty 2

## 2020-03-06 NOTE — Progress Notes (Signed)
PROGRESS NOTE  Barbara Donovan [id number] DOB: November 03, 1990   PCP: Fanny Bien, MD  Patient is from: Home  DOA: 03/04/2020 LOS: 1  Chief complaints: Nausea/vomiting/abdominal pain  Brief Narrative / Interim history: 30 year old female with history of IDDM-1, gastroparesis, CKD-5 and HTN presenting with nausea, vomiting and abdominal pain and admitted for DKA, hypertensive urgency and AKI on CKD-5.  Started on IV fluids, insulin drip and labetalol drip and admitted.  Patient's DKA resolved.  Progressive AKI on CKD.  Nephrology consulted  Subjective: Seen and examined earlier this morning.  No major events overnight of this morning.  No complaints.  She denies chest pain, dyspnea, nausea, vomiting, abdominal pain or UTI symptoms.  Reports making good amount of urine but not captured.  Objective: Vitals:   03/06/20 0900 03/06/20 1000 03/06/20 1100 03/06/20 1200  BP: (!) 183/87 (!) 170/96 (!) 149/77 (!) 150/90  Pulse: 74 68 86 84  Resp: '13 19 15 '$ (!) 21  Temp:    97.8 F (36.6 C)  TempSrc:    Oral  SpO2: 98% 98% 97% 99%  Weight:      Height:        Intake/Output Summary (Last 24 hours) at 03/06/2020 1312 Last data filed at 03/06/2020 0800 Gross per 24 hour  Intake 1866.16 ml  Output 100 ml  Net 1766.16 ml   Filed Weights   03/04/20 1329  Weight: 77.1 kg    Examination: GENERAL: No apparent distress.  Nontoxic. HEENT: MMM.  Vision and hearing grossly intact.  NECK: Supple.  No apparent JVD.  RESP: On RA.  No IWOB.  Fair aeration bilaterally. CVS:  RRR. Heart sounds normal.  ABD/GI/GU: BS+. Abd soft, NTND.  MSK/EXT:  Moves extremities. No apparent deformity. No edema.  SKIN: no apparent skin lesion or wound NEURO: Awake, alert and oriented appropriately.  No apparent focal neuro deficit. PSYCH: Calm. Normal affect.  Procedures:  None  Microbiology summarized: [id number] and influenza PCR nonreactive.  Assessment & Plan: Uncontrolled DM-1 with diabetic  ketoacidosis, CKD-5 and gastroparesis: Improving. Recent Labs  Lab 03/05/20 1359 03/05/20 1651 03/05/20 2135 03/06/20 0800 03/06/20 1209  GLUCAP 155* 207* 239* 189* 212*  -Continue SSI-sensitive -Increase NovoLog AC to 5 units if she eats > 50% of her meals. -Continue Lantus 6 units daily.  Intractable nausea and vomiting: Likely due to the above.  Resolved. -Reduce IV Reglan to 5 mg every 6 hours -Continue IV Zofran as needed -Discontinue IV fluid -Advance diet to renal/carb modified.  AKI on CKD-5/azotemia: Creatinine rising.  Due to GI loss?  Mild elevated CK but doubt this contributing followed by Dr. Joylene Grapes outpatient.  No indication for urgent dialysis. Recent Labs    02/09/20 0808 02/09/20 1208 02/09/20 2040 02/10/20 0525 03/04/20 1330 03/04/20 2200 03/05/20 0256 03/05/20 0922 03/05/20 1316 03/06/20 0246  BUN 32* 32* 31* 25* 35* 39* 41* 41* 40* 41*  CREATININE 4.11* 4.73* 4.49* 4.87* 4.39* 4.93* 5.35* 5.47* 5.66* 6.23*  -Avoid nephrotoxic meds -Appreciate help by nephrology -AKI work-up including urine studies and renal ultrasound  Hypertensive urgency: Improved. -Resume home Cardizem this morning. -Continue home Coreg and hydralazine -Change clonidine patch to p.o.  Hypokalemia: Resolved. -Replenish cautiously and recheck  Body mass index is 27.44 kg/m. Nutrition Problem: Inadequate oral intake Etiology: acute illness,nausea,vomiting Signs/Symptoms: per patient/family report Interventions: Prostat,MVI   DVT prophylaxis:  heparin injection 5,000 Units Start: 03/04/20 2200  Code Status: Full code Family Communication: Patient and/or RN. Available if any question.  Level of  care: Med-Surg.  Status is: Inpatient  Remains inpatient appropriate because:Ongoing diagnostic testing needed not appropriate for outpatient work up, Unsafe d/c plan, IV treatments appropriate due to intensity of illness or inability to take PO and Inpatient level of care  appropriate due to severity of illness   Dispo: The patient is from: Home              Anticipated d/c is to: Home              Anticipated d/c date is: 3 days              Patient currently is not medically stable to d/c.   Difficult to place patient No             Consultants:  None   Sch Meds:  Scheduled Meds: . (feeding supplement) PROSource Plus  30 mL Oral BID BM  . carvedilol  25 mg Oral BID  . Chlorhexidine Gluconate Cloth  6 each Topical Daily  . cloNIDine  0.2 mg Transdermal Weekly  . diltiazem  240 mg Oral Daily  . heparin injection (subcutaneous)  5,000 Units Subcutaneous Q8H  . hydrALAZINE  50 mg Oral TID  . insulin aspart  0-5 Units Subcutaneous QHS  . insulin aspart  0-9 Units Subcutaneous TID WC  . insulin aspart  3 Units Subcutaneous TID WC  . insulin glargine  6 Units Subcutaneous Daily  . latanoprost  1 drop Left Eye QHS  . metoCLOPramide (REGLAN) injection  10 mg Intravenous Q6H  . multivitamin  1 tablet Oral QHS  . pantoprazole  40 mg Oral Daily  . sertraline  100 mg Oral q morning  . sodium bicarbonate  325 mg Oral QID  . traZODone  50 mg Oral QHS   Continuous Infusions: . insulin Stopped (03/05/20 1403)   PRN Meds:.acetaminophen **OR** acetaminophen, dextrose, labetalol, LORazepam, ondansetron **OR** ondansetron (ZOFRAN) IV, polyethylene glycol  Antimicrobials: Anti-infectives (From admission, onward)   None       I have personally reviewed the following labs and images: CBC: Recent Labs  Lab 03/04/20 1330 03/05/20 0256 03/06/20 0246  WBC 9.9 10.4 12.0*  NEUTROABS  --  9.1*  --   HGB 12.9 12.9 10.8*  HCT 39.5 41.4 34.4*  MCV 79.5* 81.0 82.5  PLT 560* 588* 425*   BMP &GFR Recent Labs  Lab 03/04/20 2200 03/05/20 0256 03/05/20 0922 03/05/20 1316 03/06/20 0246  NA 139 139 143 138 137  K 3.8 3.6 3.2* 3.1* 4.2  CL 106 106 111 107 108  CO2 15* 15* 19* 20* 19*  GLUCOSE 334* 262* 207* 149* 221*  BUN 39* 41* 41* 40* 41*   CREATININE 4.93* 5.35* 5.47* 5.66* 6.23*  CALCIUM 8.9 9.4 8.9 8.6* 8.3*  MG  --  1.6* 2.0 1.9 2.1  PHOS  --   --  3.2 3.5 5.4*   Estimated Creatinine Clearance: 14 mL/min (A) (by C-G formula based on SCr of 6.23 mg/dL (H)). Liver & Pancreas: Recent Labs  Lab 03/04/20 1330 03/05/20 0256 03/05/20 0922 03/05/20 1316 03/06/20 0246  AST 17 21  --   --   --   ALT 14 14  --   --   --   ALKPHOS 69 78  --   --   --   BILITOT 0.6 0.7  --   --   --   PROT 6.3* 7.4  --   --   --   ALBUMIN 2.1* 2.7* 2.3* 2.2*  2.3*   Recent Labs  Lab 03/04/20 1330  LIPASE 28   No results for input(s): AMMONIA in the last 168 hours. Diabetic: No results for input(s): HGBA1C in the last 72 hours. Recent Labs  Lab 03/05/20 1359 03/05/20 1651 03/05/20 2135 03/06/20 0800 03/06/20 1209  GLUCAP 155* 207* 239* 189* 212*   Cardiac Enzymes: Recent Labs  Lab 03/06/20 0246  CKTOTAL 645*   No results for input(s): PROBNP in the last 8760 hours. Coagulation Profile: No results for input(s): INR, PROTIME in the last 168 hours. Thyroid Function Tests: No results for input(s): TSH, T4TOTAL, FREET4, T3FREE, THYROIDAB in the last 72 hours. Lipid Profile: No results for input(s): CHOL, HDL, LDLCALC, TRIG, CHOLHDL, LDLDIRECT in the last 72 hours. Anemia Panel: No results for input(s): VITAMINB12, FOLATE, FERRITIN, TIBC, IRON, RETICCTPCT in the last 72 hours. Urine analysis:    Component Value Date/Time   COLORURINE YELLOW 03/05/2020 0729   APPEARANCEUR CLEAR 03/05/2020 0729   LABSPEC 1.015 03/05/2020 0729   PHURINE 7.0 03/05/2020 0729   GLUCOSEU >=500 (A) 03/05/2020 0729   HGBUR NEGATIVE 03/05/2020 0729   BILIRUBINUR NEGATIVE 03/05/2020 0729   KETONESUR 5 (A) 03/05/2020 0729   PROTEINUR >=300 (A) 03/05/2020 0729   UROBILINOGEN 0.2 08/14/2019 1051   NITRITE NEGATIVE 03/05/2020 0729   LEUKOCYTESUR NEGATIVE 03/05/2020 0729   Sepsis Labs: Invalid input(s): PROCALCITONIN,  [place]  Microbiology: Recent Results (from the past 240 hour(s))  Resp Panel by RT-PCR (Flu A&B, Covid) Nasopharyngeal Swab     Status: None   Collection Time: 03/04/20  7:30 PM   Specimen: Nasopharyngeal Swab; Nasopharyngeal(NP) swabs in vial transport medium  Result Value Ref Range Status   SARS Coronavirus 2 by RT PCR NEGATIVE NEGATIVE Final    Comment: (NOTE) SARS-CoV-2 target nucleic acids are NOT DETECTED.  The SARS-CoV-2 RNA is generally detectable in upper respiratory specimens during the acute phase of infection. The lowest concentration of SARS-CoV-2 viral copies this assay can detect is 138 copies/mL. A negative result does not preclude SARS-Cov-2 infection and should not be used as the sole basis for treatment or other patient management decisions. A negative result may occur with  improper specimen collection/handling, submission of specimen other than nasopharyngeal swab, presence of viral mutation(s) within the areas targeted by this assay, and inadequate number of viral copies(<138 copies/mL). A negative result must be combined with clinical observations, patient history, and epidemiological information. The expected result is Negative.  Fact Sheet for Patients:  EntrepreneurPulse.com.au  Fact Sheet for Healthcare Providers:  IncredibleEmployment.be  This test is no t yet approved or cleared by the [place] FDA and  has been authorized for detection and/or diagnosis of SARS-CoV-2 by FDA under an Emergency Use Authorization (EUA). This EUA will remain  in effect (meaning this test can be used) for the duration of the COVID-19 declaration under Section 564(b)(1) of the Act, 21 U.S.C.section 360bbb-3(b)(1), unless the authorization is terminated  or revoked sooner.       Influenza A by PCR NEGATIVE NEGATIVE Final   Influenza B by PCR NEGATIVE NEGATIVE Final    Comment: (NOTE) The Xpert Xpress SARS-CoV-2/FLU/RSV plus  assay is intended as an aid in the diagnosis of influenza from Nasopharyngeal swab specimens and should not be used as a sole basis for treatment. Nasal washings and aspirates are unacceptable for Xpert Xpress SARS-CoV-2/FLU/RSV testing.  Fact Sheet for Patients: EntrepreneurPulse.com.au  Fact Sheet for Healthcare Providers: IncredibleEmployment.be  This test is not yet approved or cleared by the  [place] Architectural technologist and has been authorized for detection and/or diagnosis of SARS-CoV-2 by FDA under an Print production planner (EUA). This EUA will remain in effect (meaning this test can be used) for the duration of the COVID-19 declaration under Section 564(b)(1) of the Act, 21 U.S.C. section 360bbb-3(b)(1), unless the authorization is terminated or revoked.  Performed at [facility], [place] [address], [place], [place] [address]   MRSA PCR Screening     Status: None   Collection Time: 03/05/20  1:08 AM   Specimen: Nasopharyngeal  Result Value Ref Range Status   MRSA by PCR NEGATIVE NEGATIVE Final    Comment:        The GeneXpert MRSA Assay (FDA approved for NASAL specimens only), is one component of a comprehensive MRSA colonization surveillance program. It is not intended to diagnose MRSA infection nor to guide or monitor treatment for MRSA infections. Performed at [facility], [place] [address], [place], [place] [address]     Radiology Studies: No results found.   Anoop Hemmer T. [place]  If 7PM-7AM, please contact night-coverage [email or web address] 03/06/2020, 1:12 PM

## 2020-03-06 NOTE — Consult Note (Addendum)
Reason for Consult: Acute kidney injury on chronic kidney disease stage V Referring Physician: Wendee Beavers MD [facility])  HPI:  30 year old African-American woman with past medical history significant for hypertension, insulin-dependent diabetes for the last 10 years complicated by retinopathy and chronic kidney disease stage V at baseline (creatinine around 4.5) who recently established care with Dr. Joylene Grapes at [facility].  She was admitted to the hospital with abdominal pain, diabetic ketoacidosis and hypertensive urgency and found to have worsening renal function with a creatinine of 4.9 that has risen to 6.2.  She reports decent urine output but this is incompletely charted because she has been voiding into the toilet.  Her CPK level was marginally elevated but not quite supportive of rhabdomyolysis.  She denies any nausea or vomiting at this time like she did at admission and currently does not have any abdominal pain.  She denies any chest pain or shortness of breath.  She does not have any abnormal limb jerking movements or dysgeusia.  Past Medical History:  Diagnosis Date  . Asthma    as a child, no problems as an adult, no inhaler  . Cataract    NS OU  . Chronic hypertension during pregnancy, antepartum 08/19/2017  . Dehydration 01/28/2018  . Depression during pregnancy, antepartum 07/07/2017   6/20: Short trial of zoloft previously, reports didn't help much but also didn't give it a chance Discussed r/b/a SSRIs in pregnancy, agrees to try Zoloft again, rx sent No SI/HI/red flags  . Diabetes ([place])    TYPE I  . Diabetic retinopathy ([place]) 06/09/2017   07/2017 with bilateral severe diabetic non-proliferative retinopathy with macular edema.  [place] Kitchen HTN (hypertension)   . Hypertensive retinopathy    OU  . Hypokalemia 01/22/2018  . Hypomagnesemia 01/28/2018  . Intractable nausea and vomiting 01/22/2018  . Intrauterine growth restriction (IUGR) affecting care of mother 12/22/2017  . Morbid obesity ([place])    . 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 Proteinuria due to type 1 diabetes mellitus ([place]) 11/02/2017   Baseline Pr: Cr 10.23  . Severe hyperemesis gravidarum 10/30/2017  . Type I diabetes mellitus ([place]) 07/07/2017   Current Diabetic Medications:  Insulin  '[x]'$  Aspirin 81 mg daily after 12 weeks (? A2/B GDM)  Required Referrals for A1GDM or A2GDM: '[x]'$  Diabetes Education and Testing Supplies '[x]'$  Nutrition Cousult  For A2/B GDM or higher classes of DM '[x]'$  Diabetes Education and Testing Supplies '[x]'$  Nutrition Counsult '[x]'$  Fetal ECHO after 20 weeks  '[x]'$  Eye exam for retina evaluation - severe retinopathy 7/19  Base  . Ventricular septal defect (VSD) of fetus in singleton pregnancy, antepartum 09/30/2017   May go to newborn nursery per Dr. Lenard Simmer Echo prior to discharge    Past Surgical History:  Procedure Laterality Date  . [place] VITRECTOMY WITH 20 GAUGE MVR PORT FOR MACULAR HOLE Left 07/20/2018   Procedure: 25 GAUGE PARS PLANA VITRECTOMY LEFT EYE;  Surgeon: Bernarda Caffey, MD;  Location: [place];  Service: Ophthalmology;  Laterality: Left;  . CESAREAN SECTION N/A 12/26/2017   Procedure: CESAREAN SECTION;  Surgeon: Osborne Oman, MD;  Location: [place];  Service: Obstetrics;  Laterality: N/A;  . EYE EXAMINATION UNDER ANESTHESIA Right 07/20/2018   Procedure: Eye Exam Under Anesthesia RIGHT EYE;  Surgeon: Bernarda Caffey, MD;  Location: [place];  Service: Ophthalmology;  Laterality: Right;  . EYE SURGERY Left 07/2018  . GAS INSERTION Left 07/19/2019   Procedure: INSERTION OF GAS;  Surgeon: Bernarda Caffey, MD;  Location: [facility] [facility];  Service: Ophthalmology;  Laterality: Left;  SF6  . INJECTION OF SILICONE OIL Left [id number]   Procedure: Injection Of Silicone Oil LEFT EYE;  Surgeon: Bernarda Caffey, MD;  Location: [place];  Service: Ophthalmology;  Laterality: Left;  . LASER PHOTO ABLATION Right 07/20/2018   Procedure: Laser Photo Ablation RIGHT EYE;  Surgeon: Bernarda Caffey, MD;  Location: [place];   Service: Ophthalmology;  Laterality: Right;  . MEMBRANE PEEL Left 07/20/2018   Procedure: Membrane Peel LEFT EYE;  Surgeon: Bernarda Caffey, MD;  Location: [place];  Service: Ophthalmology;  Laterality: Left;  . MEMBRANE PEEL Left 07/19/2019   Procedure: MEMBRANE PEEL;  Surgeon: Bernarda Caffey, MD;  Location: [place];  Service: Ophthalmology;  Laterality: Left;  . MITOMYCIN C APPLICATION Bilateral [id number]   Procedure: Avastin Application;  Surgeon: Bernarda Caffey, MD;  Location: [place];  Service: Ophthalmology;  Laterality: Bilateral;  . PHOTOCOAGULATION WITH LASER Left 07/20/2018   Procedure: Photocoagulation With Laser LEFT EYE;  Surgeon: Bernarda Caffey, MD;  Location: [place];  Service: Ophthalmology;  Laterality: Left;  . PHOTOCOAGULATION WITH LASER Left 07/19/2019   Procedure: PHOTOCOAGULATION WITH LASER;  Surgeon: Bernarda Caffey, MD;  Location: [place];  Service: Ophthalmology;  Laterality: Left;  . RETINAL DETACHMENT SURGERY Left 07/20/2018   Dr. Bernarda Caffey  . SILICON OIL REMOVAL Left 07/19/2019   Procedure: 25g PARS PLANA VITRECTOMY WITH SILICON OIL REMOVAL;  Surgeon: Bernarda Caffey, MD;  Location: [place];  Service: Ophthalmology;  Laterality: Left;  . WISDOM TOOTH EXTRACTION      Family History  Problem Relation Age of Onset  . Diabetes Mother   . Aneurysm Mother 59  . Seizures Mother   . Diabetes Father   . Cataracts Father   . COPD Father   . Heart attack Father   . Heart disease Father   . Healthy Sister   . Healthy Daughter   . Stroke Maternal Grandfather   . Amblyopia Neg Hx   . Blindness Neg Hx   . Glaucoma Neg Hx   . Macular degeneration Neg Hx   . Retinal detachment Neg Hx   . Strabismus Neg Hx   . Retinitis pigmentosa Neg Hx   . Colon cancer Neg Hx   . Stomach cancer Neg Hx   . Esophageal cancer Neg Hx   . Pancreatic cancer Neg Hx   . Liver disease Neg Hx     Social History:  reports that she has never smoked. She has never used smokeless tobacco. She reports previous alcohol  use. She reports that she does not use drugs.  Allergies: No Known Allergies  Medications:  Scheduled: . carvedilol  25 mg Oral BID  . Chlorhexidine Gluconate Cloth  6 each Topical Daily  . cloNIDine  0.2 mg Transdermal Weekly  . diltiazem  240 mg Oral Daily  . heparin injection (subcutaneous)  5,000 Units Subcutaneous Q8H  . hydrALAZINE  50 mg Oral TID  . insulin aspart  0-5 Units Subcutaneous QHS  . insulin aspart  0-9 Units Subcutaneous TID WC  . insulin aspart  3 Units Subcutaneous TID WC  . insulin glargine  6 Units Subcutaneous Daily  . latanoprost  1 drop Left Eye QHS  . metoCLOPramide (REGLAN) injection  10 mg Intravenous Q6H  . pantoprazole  40 mg Oral Daily  . sertraline  100 mg Oral q morning  . sodium bicarbonate  325 mg Oral QID  . traZODone  50 mg Oral QHS   BMP  Latest Ref Rng & Units 03/06/2020 03/05/2020 03/05/2020  Glucose 70 - 99 mg/dL 221(H) 149(H) 207(H)  BUN 6 - 20 mg/dL 41(H) 40(H) 41(H)  Creatinine 0.44 - 1.00 mg/dL 6.23(H) 5.66(H) 5.47(H)  BUN/Creat Ratio 9 - 23 - - -  Sodium 135 - 145 mmol/L 137 138 143  Potassium 3.5 - 5.1 mmol/L 4.2 3.1(L) 3.2(L)  Chloride 98 - 111 mmol/L 108 107 111  CO2 22 - 32 mmol/L 19(L) 20(L) 19(L)  Calcium 8.9 - 10.3 mg/dL 8.3(L) 8.6(L) 8.9   CBC Latest Ref Rng & Units 03/06/2020 03/05/2020 03/04/2020  WBC 4.0 - 10.5 K/uL 12.0(H) 10.4 9.9  Hemoglobin 12.0 - 15.0 g/dL 10.8(L) 12.9 12.9  Hematocrit 36.0 - 46.0 % 34.4(L) 41.4 39.5  Platelets 150 - 400 K/uL 425(H) 588(H) 560(H)   Urinalysis    Component Value Date/Time   COLORURINE YELLOW 03/05/2020 0729   APPEARANCEUR CLEAR 03/05/2020 0729   LABSPEC 1.015 03/05/2020 0729   PHURINE 7.0 03/05/2020 0729   GLUCOSEU >=500 (A) 03/05/2020 0729   HGBUR NEGATIVE 03/05/2020 0729   BILIRUBINUR NEGATIVE 03/05/2020 0729   KETONESUR 5 (A) 03/05/2020 0729   PROTEINUR >=300 (A) 03/05/2020 0729   UROBILINOGEN 0.2 08/14/2019 1051   NITRITE NEGATIVE 03/05/2020 0729   LEUKOCYTESUR NEGATIVE  03/05/2020 0729      DG Chest 1 View  Result Date: 03/04/2020 CLINICAL DATA:  Nausea, vomiting and shortness of breath times 24 hours. EXAM: CHEST  1 VIEW COMPARISON:  January 24, 2020 FINDINGS: The heart size and mediastinal contours are within normal limits. Both lungs are clear. The visualized skeletal structures are unremarkable. IMPRESSION: No active disease. Electronically Signed   By: Virgina Norfolk M.D.   On: 03/04/2020 21:12    Review of Systems  Constitutional: Positive for chills and fatigue. Negative for fever.  HENT: Negative for ear pain, nosebleeds, sinus pressure, sinus pain and sore throat.   Eyes: Negative for pain and redness.  Respiratory: Negative for cough, shortness of breath and wheezing.   Cardiovascular: Negative for chest pain and leg swelling.  Gastrointestinal: Positive for abdominal pain, nausea and vomiting. Negative for diarrhea.       Abdominal pain, nausea and vomiting prior to admission without hematochezia or hematemesis  Genitourinary: Negative for dysuria, frequency, hematuria and urgency.  Musculoskeletal: Negative for back pain and myalgias.  Skin: Negative for rash and wound.  Neurological: Positive for headaches. Negative for dizziness.   Blood pressure (!) 170/96, pulse 68, temperature 99.5 F (37.5 C), temperature source Oral, resp. rate 19, height '5\' 6"'$  (1.676 m), weight 77.1 kg, SpO2 98 %, unknown if currently breastfeeding. Physical Exam Vitals and nursing note reviewed.  Constitutional:      Appearance: She is well-developed. She is obese. She is ill-appearing. She is not toxic-appearing.  HENT:     Head: Normocephalic and atraumatic.  Eyes:     Extraocular Movements: Extraocular movements intact.     Pupils: Pupils are equal, round, and reactive to light.  Neck:     Vascular: No JVD.  Cardiovascular:     Rate and Rhythm: Normal rate and regular rhythm.     Heart sounds: No murmur heard.   Pulmonary:     Effort: Pulmonary  effort is normal. No respiratory distress.     Breath sounds: Normal breath sounds.  Abdominal:     General: Bowel sounds are normal.     Palpations: Abdomen is soft. There is no mass.  Musculoskeletal:     Right lower leg:  Edema present.     Left lower leg: Edema present.     Comments: Trace bilateral lower extremity edema  Skin:    General: Skin is warm and dry.  Neurological:     General: No focal deficit present.     Mental Status: She is alert. She is disoriented.  Psychiatric:     Comments: Flat affect     Assessment/Plan: 1.  Acute kidney injury on chronic kidney disease stage V: This appears to be hemodynamically mediated in the setting of hypertensive urgency/diabetic ketoacidosis-I doubt that the mild elevation of her CPK level represents clinically significant rhabdomyolysis.  Agree with continued intravenous fluids for management of DKA.  I will check urine electrolytes. Avoid nephrotoxic medications including NSAIDs and iodinated intravenous contrast exposure unless the latter is absolutely indicated.  Preferred narcotic agents for pain control are hydromorphone, fentanyl, and methadone. Morphine should not be used. Avoid Baclofen and avoid oral sodium phosphate and magnesium citrate based laxatives / bowel preps. Continue strict Input and Output monitoring (patient instructed to collect urine rather than flushing). Will monitor the patient closely with you and intervene or adjust therapy as indicated by changes in clinical status/labs. 2.  Hypertensive emergency: Restarted back on multiple oral antihypertensive therapy after earlier being on labetalol drip-we will continue to follow blood pressures.  Suspect that sodium loading from intravenous fluids likely contributing to her elevated blood pressure at this time. 3.  Diabetic ketoacidosis: Transitioning to subcutaneous insulin from insulin drip-glycemic management per primary service. 4.  Hypokalemia: Likely secondary to  transcellular shifts with management of DKA as well as urinary losses in the setting of DKA/polyuria.  Judicious replacement per primary service.   Bjorn Hallas K. 03/06/2020, 11:37 AM

## 2020-03-06 NOTE — Progress Notes (Signed)
Initial Nutrition Assessment  DOCUMENTATION CODES:   Not applicable  INTERVENTION:  - will order 30 ml Prosource Plus TID, each supplement provides 100 kcal and 15 grams protein.  - will order 1 tablet renavit/day. - complete NFPE when feasible  NUTRITION DIAGNOSIS:   Inadequate oral intake related to acute illness,nausea,vomiting as evidenced by per patient/family report.  GOAL:   Patient will meet greater than or equal to 90% of their needs  MONITOR:   PO intake,Supplement acceptance,Labs,Weight trends  REASON FOR ASSESSMENT:   Malnutrition Screening Tool  ASSESSMENT:   30 year old female with medical history of type 1 DM, gastroparesis, stage 5 CKD, and HTN. She presented to the ED with N/V and abdominal pain. Patient was admitted for DKA, hypertensive urgency, and AKI on stage 5 CKD.  No intakes documented since admission. Diet advanced from NPO to Renal/Carb Mod with 1.2L fluid restriction yesterday at 1111, changed to CLD at 1645 and back to previous today at 1200.   Weight on 2/15 was 170 lb, which appears to be a stated weight, PTA, weight on 02/09/20 was 179 lb and on 01/27/20 was 169 lb. No information documented in the edema section of flow sheet.   She was last seen by a [place] RD when she was admitted to [facility] in 12/2017.   Nephrology saw patient earlier today; no plan for dialysis at this time.    Labs reviewed; CBGs: 189 and 212 mg/dl, BUN: 41 mg/dl, creatinine: 6.23 mg/dl, Ca: 8.3 mg/dl, Phos: 5.4 mg/dl, GFR: 9 ml/min. Medications reviewed; sliding scale novolog, 3 units novolog TID, 6 units lantus/day, 10 mg IV reglan QID, 40 mg oral protonix/day, 40 mEq Klor-Con x2 doses 2/16, 325 mg oral sodium bicarb QID.     NUTRITION - FOCUSED PHYSICAL EXAM:  unable to complete at this time.   Diet Order:   Diet Order            Diet renal/carb modified with fluid restriction Diet-HS Snack? Nothing; Fluid restriction: 1500 mL Fluid; Room service  appropriate? Yes; Fluid consistency: Thin  Diet effective now                 EDUCATION NEEDS:   Not appropriate for education at this time  Skin:  Skin Assessment: Reviewed RN Assessment  Last BM:  2/15  Height:   Ht Readings from Last 1 Encounters:  03/04/20 '5\' 6"'$  (1.676 m)    Weight:   Wt Readings from Last 1 Encounters:  03/04/20 77.1 kg    Estimated Nutritional Needs:  Kcal:  1900-2100 kcal Protein:  100-110 grams Fluid:  >/= 2.1 L/day      Jarome Matin, MS, RD, LDN, CNSC Inpatient Clinical Dietitian RD pager # available in AMION  After hours/weekend pager # available in [facility]

## 2020-03-07 DIAGNOSIS — N179 Acute kidney failure, unspecified: Secondary | ICD-10-CM | POA: Diagnosis not present

## 2020-03-07 DIAGNOSIS — R112 Nausea with vomiting, unspecified: Secondary | ICD-10-CM | POA: Diagnosis not present

## 2020-03-07 DIAGNOSIS — N185 Chronic kidney disease, stage 5: Secondary | ICD-10-CM | POA: Diagnosis not present

## 2020-03-07 DIAGNOSIS — I160 Hypertensive urgency: Secondary | ICD-10-CM | POA: Diagnosis not present

## 2020-03-07 LAB — RENAL FUNCTION PANEL
Albumin: 1.8 g/dL — ABNORMAL LOW (ref 3.5–5.0)
Anion gap: 7 (ref 5–15)
BUN: 45 mg/dL — ABNORMAL HIGH (ref 6–20)
CO2: 22 mmol/L (ref 22–32)
Calcium: 7.7 mg/dL — ABNORMAL LOW (ref 8.9–10.3)
Chloride: 107 mmol/L (ref 98–111)
Creatinine, Ser: 6.01 mg/dL — ABNORMAL HIGH (ref 0.44–1.00)
GFR, Estimated: 9 mL/min — ABNORMAL LOW (ref >60)
Glucose, Bld: 163 mg/dL — ABNORMAL HIGH (ref 70–99)
Phosphorus: 4.7 mg/dL — ABNORMAL HIGH (ref 2.5–4.6)
Potassium: 3.9 mmol/L (ref 3.5–5.1)
Sodium: 136 mmol/L (ref 135–145)

## 2020-03-07 LAB — CBC
HCT: 31.4 % — ABNORMAL LOW (ref 36.0–46.0)
Hemoglobin: 9.7 g/dL — ABNORMAL LOW (ref 12.0–15.0)
MCH: 25.6 pg — ABNORMAL LOW (ref 26.0–34.0)
MCHC: 30.9 g/dL (ref 30.0–36.0)
MCV: 82.8 fL (ref 80.0–100.0)
Platelets: 343 10*3/uL (ref 150–400)
RBC: 3.79 MIL/uL — ABNORMAL LOW (ref 3.87–5.11)
RDW: 14.6 % (ref 11.5–15.5)
WBC: 7.2 10*3/uL (ref 4.0–10.5)
nRBC: 0 % (ref 0.0–0.2)

## 2020-03-07 LAB — GLUCOSE, CAPILLARY
Glucose-Capillary: 208 mg/dL — ABNORMAL HIGH (ref 70–99)
Glucose-Capillary: 170 mg/dL — ABNORMAL HIGH (ref 70–99)
Glucose-Capillary: 269 mg/dL — ABNORMAL HIGH (ref 70–99)
Glucose-Capillary: 193 mg/dL — ABNORMAL HIGH (ref 70–99)

## 2020-03-07 LAB — CK: Total CK: 504 U/L — ABNORMAL HIGH (ref 38–234)

## 2020-03-07 LAB — MAGNESIUM: Magnesium: 1.8 mg/dL (ref 1.7–2.4)

## 2020-03-07 MED ORDER — AMLODIPINE BESYLATE 10 MG PO TABS
10.0000 mg | ORAL_TABLET | Freq: Every day | ORAL | Status: DC
  Administered 2020-03-07 – 2020-03-08 (×2): 10 mg via ORAL
  Filled 2020-03-07 (×2): qty 1

## 2020-03-07 MED ORDER — INSULIN GLARGINE 100 UNIT/ML ~~LOC~~ SOLN
5.0000 [IU] | Freq: Two times a day (BID) | SUBCUTANEOUS | Status: DC
  Administered 2020-03-07 – 2020-03-08 (×2): 5 [IU] via SUBCUTANEOUS
  Filled 2020-03-07 (×3): qty 0.05

## 2020-03-07 MED ORDER — HYDRALAZINE HCL 50 MG PO TABS
75.0000 mg | ORAL_TABLET | Freq: Three times a day (TID) | ORAL | Status: DC
  Administered 2020-03-07 – 2020-03-08 (×4): 75 mg via ORAL
  Filled 2020-03-07 (×4): qty 1

## 2020-03-07 NOTE — Progress Notes (Signed)
PROGRESS NOTE  Barbara Donovan [id number] DOB: 08-04-1990   PCP: Fanny Bien, MD  Patient is from: Home  DOA: 03/04/2020 LOS: 2  Chief complaints: Nausea/vomiting/abdominal pain  Brief Narrative / Interim history: 30 year old female with history of IDDM-1, gastroparesis, CKD-5 and HTN presenting with nausea, vomiting and abdominal pain and admitted for DKA, hypertensive urgency and AKI on CKD-5.  Started on IV fluids, insulin drip and labetalol drip and admitted.  Patient's DKA resolved.  Progressive AKI on CKD.  Nephrology consulted and following.  Subjective: Seen and examined earlier this morning. No major events overnight of this morning. No complaints. BP and Creatinine improving. Fair urine output.  Objective: Vitals:   03/06/20 2103 03/06/20 2233 03/07/20 0600 03/07/20 0901  BP: 134/81 (!) 143/80 (!) 152/82 (!) 172/92  Pulse: 76 63 (!) 52 62  Resp:  15 16   Temp:  98.6 F (37 C) 98.2 F (36.8 C)   TempSrc:  Oral Oral   SpO2: 98% 98% 99%   Weight:      Height:        Intake/Output Summary (Last 24 hours) at 03/07/2020 1137 Last data filed at 03/07/2020 1134 Gross per 24 hour  Intake 1460 ml  Output 1900 ml  Net -440 ml   Filed Weights   03/04/20 1329  Weight: 77.1 kg    Examination:  GENERAL: No apparent distress.  Nontoxic. HEENT: MMM.  Vision and hearing grossly intact.  NECK: Supple.  No apparent JVD.  RESP: On RA. No IWOB.  Fair aeration bilaterally. CVS:  RRR. Heart sounds normal.  ABD/GI/GU: BS+. Abd soft, NTND.  MSK/EXT:  Moves extremities. No apparent deformity. No edema.  SKIN: no apparent skin lesion or wound NEURO: Awake, alert and oriented appropriately.  No apparent focal neuro deficit. PSYCH: Calm. Normal affect.  Procedures:  None  Microbiology summarized: [id number] and influenza PCR nonreactive.  Assessment & Plan: Uncontrolled DM-1 with diabetic ketoacidosis, CKD-5 and gastroparesis: A1c 6.9% on 02/09/2020. DKA  resolved. Recent Labs  Lab 03/06/20 0800 03/06/20 1209 03/06/20 1709 03/06/20 2230 03/07/20 0750  GLUCAP 189* 212* 130* 157* 208*  -Continue SSI-sensitive -Continue NovoLog AC to 5 units if she eats > 50% of her meals. -Increase Lantus from 6 units daily to 5 units twice daily  Intractable nausea and vomiting: Likely due to the above.  Resolved. -Continue IV Reglan to 5 mg every 6 hours -Continue IV Zofran as needed  AKI on CKD-5/azotemia: Likely due to GI loss. Mild elevated CK but doubt this contributing. Renal ultrasound with 14 mm left kidney stone but no hydro. Urine protein > 300. Followed by Dr. Joylene Grapes outpatient.  No indication for urgent dialysis. Recent Labs    02/09/20 1208 02/09/20 2040 02/10/20 0525 03/04/20 1330 03/04/20 2200 03/05/20 0256 03/05/20 0922 03/05/20 1316 03/06/20 0246 03/07/20 0429  BUN 32* 31* 25* 35* 39* 41* 41* 40* 41* 45*  CREATININE 4.73* 4.49* 4.87* 4.39* 4.93* 5.35* 5.47* 5.66* 6.23* 6.01*  -Avoid nephrotoxic meds -Appreciate help by nephrology -Continue monitoring  Anemia of renal disease: Hgb seems to be at baseline. Some element of hemodilution Recent Labs    12/27/19 1524 01/18/20 2221 01/24/20 1938 01/25/20 0422 02/09/20 0408 02/10/20 0525 03/04/20 1330 03/05/20 0256 03/06/20 0246 03/07/20 0429  HGB 13.5 12.3 14.0 10.9* 12.4 9.4* 12.9 12.9 10.8* 9.7*  -Monitor intermittently.   Hypertensive urgency: Improved. -Change p.o. Cardizem to p.o. amlodipine given bradycardia -Continue home clonidine and Coreg. -Increase hydralazine to 75 mg 3 times daily  Hypokalemia: Resolved. -Replenish cautiously and recheck  Mild hyperphosphatemia -Continue monitoring  Mild rhabdomyolysis -Continue monitoring  Body mass index is 27.44 kg/m. Nutrition Problem: Inadequate oral intake Etiology: acute illness,nausea,vomiting Signs/Symptoms: per patient/family report Interventions: Prostat,MVI   DVT prophylaxis:  heparin  injection 5,000 Units Start: 03/04/20 2200  Code Status: Full code Family Communication: Patient and/or RN. Available if any question.  Level of care: Med-Surg.  Status is: Inpatient  Remains inpatient appropriate because:Hemodynamically unstable and Inpatient level of care appropriate due to severity of illness   Dispo: The patient is from: Home              Anticipated d/c is to: Home              Anticipated d/c date is: 2 days              Patient currently is not medically stable to d/c.   Difficult to place patient No             Consultants:  Nephrology   Sch Meds:  Scheduled Meds: . (feeding supplement) PROSource Plus  30 mL Oral BID BM  . amLODipine  10 mg Oral Daily  . carvedilol  25 mg Oral BID  . Chlorhexidine Gluconate Cloth  6 each Topical Daily  . cloNIDine  0.2 mg Oral BID  . heparin injection (subcutaneous)  5,000 Units Subcutaneous Q8H  . hydrALAZINE  75 mg Oral TID  . insulin aspart  0-5 Units Subcutaneous QHS  . insulin aspart  0-9 Units Subcutaneous TID WC  . insulin aspart  5 Units Subcutaneous TID WC  . insulin glargine  6 Units Subcutaneous Daily  . latanoprost  1 drop Left Eye QHS  . metoCLOPramide (REGLAN) injection  5 mg Intravenous Q6H  . multivitamin  1 tablet Oral QHS  . pantoprazole  40 mg Oral Daily  . sertraline  100 mg Oral q morning  . sodium bicarbonate  325 mg Oral QID  . traZODone  50 mg Oral QHS   Continuous Infusions:  PRN Meds:.acetaminophen **OR** acetaminophen, dextrose, labetalol, LORazepam, ondansetron **OR** ondansetron (ZOFRAN) IV, polyethylene glycol  Antimicrobials: Anti-infectives (From admission, onward)   None       I have personally reviewed the following labs and images: CBC: Recent Labs  Lab 03/04/20 1330 03/05/20 0256 03/06/20 0246 03/07/20 0429  WBC 9.9 10.4 12.0* 7.2  NEUTROABS  --  9.1*  --   --   HGB 12.9 12.9 10.8* 9.7*  HCT 39.5 41.4 34.4* 31.4*  MCV 79.5* 81.0 82.5 82.8  PLT 560*  588* 425* 343   BMP &GFR Recent Labs  Lab 03/05/20 0256 03/05/20 0922 03/05/20 1316 03/06/20 0246 03/07/20 0429  NA 139 143 138 137 136  K 3.6 3.2* 3.1* 4.2 3.9  CL 106 111 107 108 107  CO2 15* 19* 20* 19* 22  GLUCOSE 262* 207* 149* 221* 163*  BUN 41* 41* 40* 41* 45*  CREATININE 5.35* 5.47* 5.66* 6.23* 6.01*  CALCIUM 9.4 8.9 8.6* 8.3* 7.7*  MG 1.6* 2.0 1.9 2.1 1.8  PHOS  --  3.2 3.5 5.4* 4.7*   Estimated Creatinine Clearance: 14.5 mL/min (A) (by C-G formula based on SCr of 6.01 mg/dL (H)). Liver & Pancreas: Recent Labs  Lab 03/04/20 1330 03/05/20 0256 03/05/20 0922 03/05/20 1316 03/06/20 0246 03/07/20 0429  AST 17 21  --   --   --   --   ALT 14 14  --   --   --   --  ALKPHOS 69 78  --   --   --   --   BILITOT 0.6 0.7  --   --   --   --   PROT 6.3* 7.4  --   --   --   --   ALBUMIN 2.1* 2.7* 2.3* 2.2* 2.3* 1.8*   Recent Labs  Lab 03/04/20 1330  LIPASE 28   No results for input(s): AMMONIA in the last 168 hours. Diabetic: No results for input(s): HGBA1C in the last 72 hours. Recent Labs  Lab 03/06/20 0800 03/06/20 1209 03/06/20 1709 03/06/20 2230 03/07/20 0750  GLUCAP 189* 212* 130* 157* 208*   Cardiac Enzymes: Recent Labs  Lab 03/06/20 0246 03/07/20 0429  CKTOTAL 645* 504*   No results for input(s): PROBNP in the last 8760 hours. Coagulation Profile: No results for input(s): INR, PROTIME in the last 168 hours. Thyroid Function Tests: No results for input(s): TSH, T4TOTAL, FREET4, T3FREE, THYROIDAB in the last 72 hours. Lipid Profile: No results for input(s): CHOL, HDL, LDLCALC, TRIG, CHOLHDL, LDLDIRECT in the last 72 hours. Anemia Panel: No results for input(s): VITAMINB12, FOLATE, FERRITIN, TIBC, IRON, RETICCTPCT in the last 72 hours. Urine analysis:    Component Value Date/Time   COLORURINE STRAW (A) 03/06/2020 1726   APPEARANCEUR CLEAR 03/06/2020 1726   LABSPEC 1.008 03/06/2020 1726   PHURINE 7.0 03/06/2020 1726   GLUCOSEU >=500 (A)  03/06/2020 1726   HGBUR NEGATIVE 03/06/2020 1726   BILIRUBINUR NEGATIVE 03/06/2020 1726   KETONESUR NEGATIVE 03/06/2020 1726   PROTEINUR >=300 (A) 03/06/2020 1726   UROBILINOGEN 0.2 08/14/2019 1051   NITRITE NEGATIVE 03/06/2020 1726   LEUKOCYTESUR NEGATIVE 03/06/2020 1726   Sepsis Labs: Invalid input(s): PROCALCITONIN, [place]  Microbiology: Recent Results (from the past 240 hour(s))  Resp Panel by RT-PCR (Flu A&B, Covid) Nasopharyngeal Swab     Status: None   Collection Time: 03/04/20  7:30 PM   Specimen: Nasopharyngeal Swab; Nasopharyngeal(NP) swabs in vial transport medium  Result Value Ref Range Status   SARS Coronavirus 2 by RT PCR NEGATIVE NEGATIVE Final    Comment: (NOTE) SARS-CoV-2 target nucleic acids are NOT DETECTED.  The SARS-CoV-2 RNA is generally detectable in upper respiratory specimens during the acute phase of infection. The lowest concentration of SARS-CoV-2 viral copies this assay can detect is 138 copies/mL. A negative result does not preclude SARS-Cov-2 infection and should not be used as the sole basis for treatment or other patient management decisions. A negative result may occur with  improper specimen collection/handling, submission of specimen other than nasopharyngeal swab, presence of viral mutation(s) within the areas targeted by this assay, and inadequate number of viral copies(<138 copies/mL). A negative result must be combined with clinical observations, patient history, and epidemiological information. The expected result is Negative.  Fact Sheet for Patients:  EntrepreneurPulse.com.au  Fact Sheet for Healthcare Providers:  IncredibleEmployment.be  This test is no t yet approved or cleared by the [place] FDA and  has been authorized for detection and/or diagnosis of SARS-CoV-2 by FDA under an Emergency Use Authorization (EUA). This EUA will remain  in effect (meaning this test can be used) for  the duration of the COVID-19 declaration under Section 564(b)(1) of the Act, 21 U.S.C.section 360bbb-3(b)(1), unless the authorization is terminated  or revoked sooner.       Influenza A by PCR NEGATIVE NEGATIVE Final   Influenza B by PCR NEGATIVE NEGATIVE Final    Comment: (NOTE) The Xpert Xpress SARS-CoV-2/FLU/RSV plus assay is intended as  an aid in the diagnosis of influenza from Nasopharyngeal swab specimens and should not be used as a sole basis for treatment. Nasal washings and aspirates are unacceptable for Xpert Xpress SARS-CoV-2/FLU/RSV testing.  Fact Sheet for Patients: EntrepreneurPulse.com.au  Fact Sheet for Healthcare Providers: IncredibleEmployment.be  This test is not yet approved or cleared by the [place] FDA and has been authorized for detection and/or diagnosis of SARS-CoV-2 by FDA under an Emergency Use Authorization (EUA). This EUA will remain in effect (meaning this test can be used) for the duration of the COVID-19 declaration under Section 564(b)(1) of the Act, 21 U.S.C. section 360bbb-3(b)(1), unless the authorization is terminated or revoked.  Performed at [facility], [place] [address] [place], [place] [address]   MRSA PCR Screening     Status: None   Collection Time: 03/05/20  1:08 AM   Specimen: Nasopharyngeal  Result Value Ref Range Status   MRSA by PCR NEGATIVE NEGATIVE Final    Comment:        The GeneXpert MRSA Assay (FDA approved for NASAL specimens only), is one component of a comprehensive MRSA colonization surveillance program. It is not intended to diagnose MRSA infection nor to guide or monitor treatment for MRSA infections. Performed at [facility], [place] [address] [place], [place] [address]     Radiology Studies: US RENAL  Result Date: 03/06/2020 CLINICAL DATA:  Acute kidney injury EXAM: RENAL / URINARY TRACT ULTRASOUND COMPLETE COMPARISON:   01/25/2020 FINDINGS: Right Kidney: Renal measurements: 13.3 x 7.3 x 7.4 cm = volume: 375 mL. Normal cortical thickness. Increased cortical echogenicity. No mass, hydronephrosis, or shadowing calcification. Left Kidney: Renal measurements: 14.2 x 6.6 x 7.3 cm = volume: 356 mL. Normal cortical thickness. Increased cortical echogenicity. Hyperechoic focus mid LEFT kidney centrally, 14 mm diameter, mildly shadowing, likely nonobstructing calculus. No mass or hydronephrosis. Bladder: Appears normal for degree of bladder distention. BILATERAL ureteral jets noted. Other: N/A IMPRESSION: Probable 14 mm nonobstructing calculus mid LEFT kidney. Medical renal disease changes of both kidneys. No evidence of renal mass or hydronephrosis. Electronically Signed   By: Lavonia Dana M.D.   On: 03/06/2020 14:56     Taye T. Fairbanks  If 7PM-7AM, please contact night-coverage [email or web address] 03/07/2020, 11:37 AM

## 2020-03-07 NOTE — Progress Notes (Signed)
Patient ID: Barbara Donovan, female   DOB: [birth year], 30 y.o.   MRN: [id number] [facility] Progress Note   Assessment/ Plan:   1.  Acute kidney injury on chronic kidney disease stage V: This appears to be hemodynamically mediated in the setting of hypertensive urgency/diabetic ketoacidosis.  She is nonoliguric and creatinine slightly better this morning without any acute electrolyte abnormality or indications for dialysis.  We will continue conservative/supportive management at this time as we treat underlying hypertension/diabetes. 2.  Hypertensive emergency:  Blood pressure remains intermittently elevated, now off of intravenous fluids and able to tolerate oral intake.  Now on hydralazine 75 mg 3 times a day in addition to clonidine, amlodipine and carvedilol. 3.  Diabetic ketoacidosis: Transitioned yesterday to subcutaneous insulin from insulin drip-glycemic management per primary service. 4.  Hypokalemia: Likely secondary to transcellular shifts with management of DKA as well as urinary losses in the setting of DKA/polyuria.    Replaced adequately overnight-we will continue to follow this closely anticipating some wasting with improving urine output.  Subjective:   Reports that she is feeling better and denies any chest pain, shortness of breath, nausea, vomiting or dysgeusia.  No limb jerking movements.   Objective:   BP (!) 172/92   Pulse 62   Temp 98.2 F (36.8 C) (Oral)   Resp 16   Ht '5\' 6"'$  (1.676 m)   Wt 77.1 kg   LMP  (LMP Unknown)   SpO2 99%   BMI 27.44 kg/m   Intake/Output Summary (Last 24 hours) at 03/07/2020 1311 Last data filed at 03/07/2020 1134 Gross per 24 hour  Intake 1460 ml  Output 1900 ml  Net -440 ml   Weight change:   Physical Exam: Gen: Appears comfortable resting in bed, has just finished her lunch. CVS: Pulse regular rhythm, normal rate, S1 and S2 normal Resp: Clear to auscultation bilaterally, no rales/rhonchi Abd: Soft, obese,  nontender, bowel sounds normal Ext: No lower extremity edema noted.  No asterixis.  Imaging: US RENAL  Result Date: 03/06/2020 CLINICAL DATA:  Acute kidney injury EXAM: RENAL / URINARY TRACT ULTRASOUND COMPLETE COMPARISON:  01/25/2020 FINDINGS: Right Kidney: Renal measurements: 13.3 x 7.3 x 7.4 cm = volume: 375 mL. Normal cortical thickness. Increased cortical echogenicity. No mass, hydronephrosis, or shadowing calcification. Left Kidney: Renal measurements: 14.2 x 6.6 x 7.3 cm = volume: 356 mL. Normal cortical thickness. Increased cortical echogenicity. Hyperechoic focus mid LEFT kidney centrally, 14 mm diameter, mildly shadowing, likely nonobstructing calculus. No mass or hydronephrosis. Bladder: Appears normal for degree of bladder distention. BILATERAL ureteral jets noted. Other: N/A IMPRESSION: Probable 14 mm nonobstructing calculus mid LEFT kidney. Medical renal disease changes of both kidneys. No evidence of renal mass or hydronephrosis. Electronically Signed   By: Lavonia Dana M.D.   On: 03/06/2020 14:56    Labs: BMET Recent Labs  Lab 03/04/20 1330 03/04/20 2200 03/05/20 0256 03/05/20 0922 03/05/20 1316 03/06/20 0246 03/07/20 0429  NA 136 139 139 143 138 137 136  K 3.5 3.8 3.6 3.2* 3.1* 4.2 3.9  CL 107 106 106 111 107 108 107  CO2 14* 15* 15* 19* 20* 19* 22  GLUCOSE 313* 334* 262* 207* 149* 221* 163*  BUN 35* 39* 41* 41* 40* 41* 45*  CREATININE 4.39* 4.93* 5.35* 5.47* 5.66* 6.23* 6.01*  CALCIUM 8.1* 8.9 9.4 8.9 8.6* 8.3* 7.7*  PHOS  --   --   --  3.2 3.5 5.4* 4.7*   CBC Recent Labs  Lab 03/04/20  1330 03/05/20 0256 03/06/20 0246 03/07/20 0429  WBC 9.9 10.4 12.0* 7.2  NEUTROABS  --  9.1*  --   --   HGB 12.9 12.9 10.8* 9.7*  HCT 39.5 41.4 34.4* 31.4*  MCV 79.5* 81.0 82.5 82.8  PLT 560* 588* 425* 343    Medications:    . (feeding supplement) PROSource Plus  30 mL Oral BID BM  . amLODipine  10 mg Oral Daily  . carvedilol  25 mg Oral BID  . Chlorhexidine Gluconate  Cloth  6 each Topical Daily  . cloNIDine  0.2 mg Oral BID  . heparin injection (subcutaneous)  5,000 Units Subcutaneous Q8H  . hydrALAZINE  75 mg Oral TID  . insulin aspart  0-5 Units Subcutaneous QHS  . insulin aspart  0-9 Units Subcutaneous TID WC  . insulin aspart  5 Units Subcutaneous TID WC  . insulin glargine  5 Units Subcutaneous BID  . latanoprost  1 drop Left Eye QHS  . metoCLOPramide (REGLAN) injection  5 mg Intravenous Q6H  . multivitamin  1 tablet Oral QHS  . pantoprazole  40 mg Oral Daily  . sertraline  100 mg Oral q morning  . sodium bicarbonate  325 mg Oral QID  . traZODone  50 mg Oral QHS   Elmarie Shiley, MD 03/07/2020, 1:11 PM

## 2020-03-08 DIAGNOSIS — N179 Acute kidney failure, unspecified: Secondary | ICD-10-CM | POA: Diagnosis not present

## 2020-03-08 DIAGNOSIS — E1010 Type 1 diabetes mellitus with ketoacidosis without coma: Secondary | ICD-10-CM | POA: Diagnosis not present

## 2020-03-08 DIAGNOSIS — R112 Nausea with vomiting, unspecified: Secondary | ICD-10-CM | POA: Diagnosis not present

## 2020-03-08 DIAGNOSIS — N185 Chronic kidney disease, stage 5: Secondary | ICD-10-CM | POA: Diagnosis not present

## 2020-03-08 LAB — RENAL FUNCTION PANEL
Albumin: 2 g/dL — ABNORMAL LOW (ref 3.5–5.0)
Anion gap: 10 (ref 5–15)
BUN: 51 mg/dL — ABNORMAL HIGH (ref 6–20)
CO2: 19 mmol/L — ABNORMAL LOW (ref 22–32)
Calcium: 8 mg/dL — ABNORMAL LOW (ref 8.9–10.3)
Chloride: 105 mmol/L (ref 98–111)
Creatinine, Ser: 5.51 mg/dL — ABNORMAL HIGH (ref 0.44–1.00)
GFR, Estimated: 10 mL/min — ABNORMAL LOW (ref >60)
Glucose, Bld: 272 mg/dL — ABNORMAL HIGH (ref 70–99)
Phosphorus: 4.4 mg/dL (ref 2.5–4.6)
Potassium: 4 mmol/L (ref 3.5–5.1)
Sodium: 134 mmol/L — ABNORMAL LOW (ref 135–145)

## 2020-03-08 LAB — CBC
HCT: 35.8 % — ABNORMAL LOW (ref 36.0–46.0)
Hemoglobin: 10.9 g/dL — ABNORMAL LOW (ref 12.0–15.0)
MCH: 25.5 pg — ABNORMAL LOW (ref 26.0–34.0)
MCHC: 30.4 g/dL (ref 30.0–36.0)
MCV: 83.6 fL (ref 80.0–100.0)
Platelets: 358 10*3/uL (ref 150–400)
RBC: 4.28 MIL/uL (ref 3.87–5.11)
RDW: 14.2 % (ref 11.5–15.5)
WBC: 6.2 10*3/uL (ref 4.0–10.5)
nRBC: 0 % (ref 0.0–0.2)

## 2020-03-08 LAB — CK: Total CK: 295 U/L — ABNORMAL HIGH (ref 38–234)

## 2020-03-08 LAB — MAGNESIUM: Magnesium: 1.9 mg/dL (ref 1.7–2.4)

## 2020-03-08 LAB — GLUCOSE, CAPILLARY: Glucose-Capillary: 288 mg/dL — ABNORMAL HIGH (ref 70–99)

## 2020-03-08 MED ORDER — HYDRALAZINE HCL 25 MG PO TABS: 75.0000 mg | ORAL_TABLET | Freq: Three times a day (TID) | ORAL | 2 refills | Status: DC

## 2020-03-08 MED ORDER — LANTUS SOLOSTAR 100 UNIT/ML ~~LOC~~ SOPN: 10.0000 [IU] | PEN_INJECTOR | Freq: Every day | SUBCUTANEOUS | 0 refills | Status: DC

## 2020-03-08 MED ORDER — AMLODIPINE BESYLATE 10 MG PO TABS: 10.0000 mg | ORAL_TABLET | Freq: Every day | ORAL | 1 refills | Status: AC

## 2020-03-08 MED ORDER — CLONIDINE HCL 0.2 MG PO TABS: 0.2000 mg | ORAL_TABLET | Freq: Two times a day (BID) | ORAL | 1 refills | Status: DC

## 2020-03-08 MED ORDER — RENA-VITE PO TABS: 1.0000 | ORAL_TABLET | Freq: Every day | ORAL | 1 refills | Status: DC

## 2020-03-08 MED ORDER — INSULIN LISPRO (1 UNIT DIAL) 100 UNIT/ML (KWIKPEN): 4.0000 [IU] | PEN_INJECTOR | Freq: Three times a day (TID) | SUBCUTANEOUS | 0 refills | Status: DC

## 2020-03-08 NOTE — Discharge Summary (Signed)
Physician Discharge Summary  Barbara Donovan [id number] DOB: [birth year] DOA: 03/04/2020  PCP: Fanny Bien, MD  Admit date: 03/04/2020 Discharge date: 03/08/2020  Admitted From: Home Disposition:  Home  Recommendations for Outpatient Follow-up:  1. Follow ups as below. 2. Patient has outpatient follow-up with nephrology on 03/31/2020 3. Please obtain CBC/BMP/Mag at follow up 4. Please follow up on the following pending results: none  Home Health: none Equipment/Devices: none  Discharge Condition: stable CODE STATUS: full code   Follow-up Information    Fanny Bien, MD. Schedule an appointment as soon as possible for a visit in 1 week(s).   Specialty: Family Medicine Contact information: [address] [address] [phone]        Sueanne Margarita, MD. Schedule an appointment as soon as possible for a visit in 2 week(s).   Specialty: Cardiology Contact information: [id number] [address] [phone]               Hospital Course: 30 year old female with history of IDDM-1, gastroparesis, CKD-5 and HTN presenting with nausea, vomiting and abdominal pain and admitted for DKA, hypertensive urgency and AKI on CKD-5.  Started on IV fluids, insulin drip and labetalol drip and admitted.  Patient's DKA resolved.  However, she had AKI on CKD-5.  Nephrology consulted.  Work-up including UA and renal ultrasound without significant finding but 14 mm nonobstructing left nephrolithiasis.  Eventually, AKI improved with improvement in her hypertension.  She was cleared for discharge by nephrology for outpatient follow-up.  See individual problem list below for more on hospital course.  Discharge Diagnoses:  Uncontrolled DM-1 with diabetic ketoacidosis, CKD-5 and gastroparesis: A1c 6.9% on 02/09/2020. DKA resolved. Recent Labs  Lab 03/07/20 0750 03/07/20 1157 03/07/20 1652 03/07/20 1957 03/08/20 0734  GLUCAP  208* 170* 269* 193* 288*  -Increased from Lantus to 10 units daily. -Continue home lispro 4 units 3 times daily  Intractable nausea and vomiting: Likely due to the above.  Resolved. -Continue home as needed Reglan.  AKI on CKD-5/azotemia: Likely due to GI loss. Mild elevated CK but doubt this contributing. Renal ultrasound with 14 mm left kidney stone but no hydro. Urine protein > 300. Followed by Dr. Joylene Grapes outpatient.  No indication for urgent dialysis.  Improved and cleared for discharge by nephrology. Recent Labs    02/09/20 2040 02/10/20 0525 03/04/20 1330 03/04/20 2200 03/05/20 0256 03/05/20 0922 03/05/20 1316 03/06/20 0246 03/07/20 0429 03/08/20 0451  BUN 31* 25* 35* 39* 41* 41* 40* 41* 45* 51*  CREATININE 4.49* 4.87* 4.39* 4.93* 5.35* 5.47* 5.66* 6.23* 6.01* 5.51*  -Outpatient follow-up with your nephrologist  Anemia of renal disease: Hgb seems to be at baseline. Some element of hemodilution Recent Labs    01/18/20 2221 01/24/20 1938 01/25/20 0422 02/09/20 0408 02/10/20 0525 03/04/20 1330 03/05/20 0256 03/06/20 0246 03/07/20 0429 03/08/20 0451  HGB 12.3 14.0 10.9* 12.4 9.4* 12.9 12.9 10.8* 9.7* 10.9*  -Recheck CBC at follow-up  Hypertensive urgency: Improved. -Changed p.o. Cardizem to p.o. amlodipine given bradycardia -Continue home clonidine and Coreg. -Increased hydralazine to 75 mg 3 times daily  Hypokalemia: Resolved. -Replenish cautiously and recheck  Mild hyperphosphatemia: Resolved -Continue monitoring  Mild rhabdomyolysis: Improved -Continue monitoring  Overweight Body mass index is 27.44 kg/m. Nutrition Problem: Inadequate oral intake Etiology: acute illness,nausea,vomiting Signs/Symptoms: per patient/family report Interventions: Prostat,MVI      Discharge Exam: Vitals:   03/07/20 2026 03/08/20 0616  BP: (!) 148/82 Marland Kitchen)  147/89  Pulse: 72 72  Resp: 18 18  Temp: 98.7 F (37.1 C) 98.9 F (37.2 C)  SpO2: 99% 98%     GENERAL: No apparent distress.  Nontoxic. HEENT: MMM.  Vision and hearing grossly intact.  NECK: Supple.  No apparent JVD.  RESP:  No IWOB.  Fair aeration bilaterally. CVS:  RRR. Heart sounds normal.  ABD/GI/GU: Bowel sounds present. Soft. Non tender.  MSK/EXT:  Moves extremities. No apparent deformity. No edema.  SKIN: no apparent skin lesion or wound NEURO: Awake, alert and oriented appropriately.  No apparent focal neuro deficit. PSYCH: Calm. Normal affect.  Discharge Instructions  Discharge Instructions    Call MD for:  difficulty breathing, headache or visual disturbances   Complete by: As directed    Call MD for:  extreme fatigue   Complete by: As directed    Call MD for:  persistant dizziness or light-headedness   Complete by: As directed    Call MD for:  persistant nausea and vomiting   Complete by: As directed    Call MD for:  temperature >100.4   Complete by: As directed    Diet - low sodium heart healthy   Complete by: As directed    Diet Carb Modified   Complete by: As directed    Discharge instructions   Complete by: As directed    It has been a pleasure taking care of you!  You were hospitalized due to nausea, vomiting and abdominal pain likely from diabetic ketoacidosis.  Your symptoms improved with treatment of diabetic ketoacidosis and IV fluid hydration.  You also had acute kidney injury that has improved with IV fluid hydration as well.  We made some adjustment to your home medications during this hospitalization.  Please review your new medication list and the directions on your medications before you take them. Please follow-up with your primary care doctor in 1 to 2 weeks.  We also recommend going to the appointment with the nephrologist next month.   Take care,   Increase activity slowly   Complete by: As directed      Allergies as of 03/08/2020   No Known Allergies     Medication List    STOP taking these medications   diltiazem 240 MG 24  hr capsule Commonly known as: CARDIZEM CD     TAKE these medications   amLODipine 10 MG tablet Commonly known as: NORVASC Take 1 tablet (10 mg total) by mouth daily. Start taking on: March 09, 2020   carvedilol 25 MG tablet Commonly known as: COREG Take 1 tablet (25 mg total) by mouth 2 (two) times daily.   cloNIDine 0.2 MG tablet Commonly known as: CATAPRES Take 1 tablet (0.2 mg total) by mouth 2 (two) times daily. What changed:   medication strength  how much to take  additional instructions   Continuous Blood Gluc Sensor Misc 1 each by Does not apply route as directed. Use as directed every 14 days. May dispense FreeStyle [employer] or similar.   hydrALAZINE 25 MG tablet Commonly known as: APRESOLINE Take 3 tablets (75 mg total) by mouth 3 (three) times daily. What changed:   medication strength  how much to take   insulin lispro 100 UNIT/ML KwikPen Commonly known as: HumaLOG KwikPen Inject 4 Units into the skin 3 (three) times daily.   Lantus SoloStar 100 UNIT/ML Solostar Pen Generic drug: insulin glargine Inject 10 Units into the skin daily. What changed: how much to take  latanoprost 0.005 % ophthalmic solution Commonly known as: Xalatan Place 1 drop into the left eye at bedtime.   metoCLOPramide 5 MG tablet Commonly known as: REGLAN Take 1 tablet (5 mg total) by mouth every 8 (eight) hours as needed for nausea or vomiting.   multivitamin Tabs tablet Take 1 tablet by mouth at bedtime.   pantoprazole 40 MG tablet Commonly known as: PROTONIX Take 1 tablet (40 mg total) by mouth daily.   sertraline 100 MG tablet Commonly known as: ZOLOFT Take 100 mg by mouth every morning.   sodium bicarbonate 325 MG tablet Take 325 mg by mouth 4 (four) times daily.   traZODone 50 MG tablet Commonly known as: DESYREL Take 50 mg by mouth at bedtime.       Consultations:  Nephrology  Procedures/Studies:   DG Chest 1 View  Result Date:  03/04/2020 CLINICAL DATA:  Nausea, vomiting and shortness of breath times 24 hours. EXAM: CHEST  1 VIEW COMPARISON:  January 24, 2020 FINDINGS: The heart size and mediastinal contours are within normal limits. Both lungs are clear. The visualized skeletal structures are unremarkable. IMPRESSION: No active disease. Electronically Signed   By: Virgina Norfolk M.D.   On: 03/04/2020 21:12   US RENAL  Result Date: 03/06/2020 CLINICAL DATA:  Acute kidney injury EXAM: RENAL / URINARY TRACT ULTRASOUND COMPLETE COMPARISON:  01/25/2020 FINDINGS: Right Kidney: Renal measurements: 13.3 x 7.3 x 7.4 cm = volume: 375 mL. Normal cortical thickness. Increased cortical echogenicity. No mass, hydronephrosis, or shadowing calcification. Left Kidney: Renal measurements: 14.2 x 6.6 x 7.3 cm = volume: 356 mL. Normal cortical thickness. Increased cortical echogenicity. Hyperechoic focus mid LEFT kidney centrally, 14 mm diameter, mildly shadowing, likely nonobstructing calculus. No mass or hydronephrosis. Bladder: Appears normal for degree of bladder distention. BILATERAL ureteral jets noted. Other: N/A IMPRESSION: Probable 14 mm nonobstructing calculus mid LEFT kidney. Medical renal disease changes of both kidneys. No evidence of renal mass or hydronephrosis. Electronically Signed   By: Lavonia Dana M.D.   On: 03/06/2020 14:56   OCT, Retina - OU - Both Eyes  Result Date: 02/26/2020 Right Eye Quality was borderline. Central Foveal Thickness: 212. Progression has improved. Findings include no SRF, intraretinal hyper-reflective material, no IRF, normal foveal contour, macular pucker, epiretinal membrane, preretinal fibrosis (Interval improvement in vitreous opacities -- essentially resolved). Left Eye Quality was good. Central Foveal Thickness: 251. Progression has improved. Findings include abnormal foveal contour, epiretinal membrane, no SRF, outer retinal atrophy, intraretinal fluid (Interval improvement in nasal retinal thickening;  stable improvement in ERM; persistent central ORA/SRHM). Notes *Images captured and stored on drive Diagnosis / Impression: PDR OU OD: Interval improvement in vitreous opacities-- essentially resolved OS: Interval improvement in nasal retinal thickening; stable improvement in ERM; persistent central ORA/SRHM Clinical management: See below Abbreviations: NFP - Normal foveal profile. CME - cystoid macular edema. PED - pigment epithelial detachment. IRF - intraretinal fluid. SRF - subretinal fluid. EZ - ellipsoid zone. ERM - epiretinal membrane. ORA - outer retinal atrophy. ORT - outer retinal tubulation. SRHM - subretinal hyper-reflective material       The results of significant diagnostics from this hospitalization (including imaging, microbiology, ancillary and laboratory) are listed below for reference.     Microbiology: Recent Results (from the past 240 hour(s))  Resp Panel by RT-PCR (Flu A&B, Covid) Nasopharyngeal Swab     Status: None   Collection Time: 03/04/20  7:30 PM   Specimen: Nasopharyngeal Swab; Nasopharyngeal(NP) swabs in vial transport medium  Result  Value Ref Range Status   SARS Coronavirus 2 by RT PCR NEGATIVE NEGATIVE Final    Comment: (NOTE) SARS-CoV-2 target nucleic acids are NOT DETECTED.  The SARS-CoV-2 RNA is generally detectable in upper respiratory specimens during the acute phase of infection. The lowest concentration of SARS-CoV-2 viral copies this assay can detect is 138 copies/mL. A negative result does not preclude SARS-Cov-2 infection and should not be used as the sole basis for treatment or other patient management decisions. A negative result may occur with  improper specimen collection/handling, submission of specimen other than nasopharyngeal swab, presence of viral mutation(s) within the areas targeted by this assay, and inadequate number of viral copies(<138 copies/mL). A negative result must be combined with clinical observations, patient history,  and epidemiological information. The expected result is Negative.  Fact Sheet for Patients:  EntrepreneurPulse.com.au  Fact Sheet for Healthcare Providers:  IncredibleEmployment.be  This test is no t yet approved or cleared by the [place] FDA and  has been authorized for detection and/or diagnosis of SARS-CoV-2 by FDA under an Emergency Use Authorization (EUA). This EUA will remain  in effect (meaning this test can be used) for the duration of the COVID-19 declaration under Section 564(b)(1) of the Act, 21 U.S.C.section 360bbb-3(b)(1), unless the authorization is terminated  or revoked sooner.       Influenza A by PCR NEGATIVE NEGATIVE Final   Influenza B by PCR NEGATIVE NEGATIVE Final    Comment: (NOTE) The Xpert Xpress SARS-CoV-2/FLU/RSV plus assay is intended as an aid in the diagnosis of influenza from Nasopharyngeal swab specimens and should not be used as a sole basis for treatment. Nasal washings and aspirates are unacceptable for Xpert Xpress SARS-CoV-2/FLU/RSV testing.  Fact Sheet for Patients: EntrepreneurPulse.com.au  Fact Sheet for Healthcare Providers: IncredibleEmployment.be  This test is not yet approved or cleared by the [place] FDA and has been authorized for detection and/or diagnosis of SARS-CoV-2 by FDA under an Emergency Use Authorization (EUA). This EUA will remain in effect (meaning this test can be used) for the duration of the COVID-19 declaration under Section 564(b)(1) of the Act, 21 U.S.C. section 360bbb-3(b)(1), unless the authorization is terminated or revoked.  Performed at [facility], [place] [address] [place], [place] [address]   MRSA PCR Screening     Status: None   Collection Time: 03/05/20  1:08 AM   Specimen: Nasopharyngeal  Result Value Ref Range Status   MRSA by PCR NEGATIVE NEGATIVE Final    Comment:        The GeneXpert MRSA  Assay (FDA approved for NASAL specimens only), is one component of a comprehensive MRSA colonization surveillance program. It is not intended to diagnose MRSA infection nor to guide or monitor treatment for MRSA infections. Performed at [facility], [place] [address], [place], [place] [address]      Labs:  CBC: Recent Labs  Lab 03/04/20 1330 03/05/20 0256 03/06/20 0246 03/07/20 0429 03/08/20 0451  WBC 9.9 10.4 12.0* 7.2 6.2  NEUTROABS  --  9.1*  --   --   --   HGB 12.9 12.9 10.8* 9.7* 10.9*  HCT 39.5 41.4 34.4* 31.4* 35.8*  MCV 79.5* 81.0 82.5 82.8 83.6  PLT 560* 588* 425* 343 358   BMP &GFR Recent Labs  Lab 03/05/20 0922 03/05/20 1316 03/06/20 0246 03/07/20 0429 03/08/20 0451  NA 143 138 137 136 134*  K 3.2* 3.1* 4.2 3.9 4.0  CL 111 107 108 107 105  CO2  19* 20* 19* 22 19*  GLUCOSE 207* 149* 221* 163* 272*  BUN 41* 40* 41* 45* 51*  CREATININE 5.47* 5.66* 6.23* 6.01* 5.51*  CALCIUM 8.9 8.6* 8.3* 7.7* 8.0*  MG 2.0 1.9 2.1 1.8 1.9  PHOS 3.2 3.5 5.4* 4.7* 4.4   Estimated Creatinine Clearance: 15.8 mL/min (A) (by C-G formula based on SCr of 5.51 mg/dL (H)). Liver & Pancreas: Recent Labs  Lab 03/04/20 1330 03/05/20 0256 03/05/20 0922 03/05/20 1316 03/06/20 0246 03/07/20 0429 03/08/20 0451  AST 17 21  --   --   --   --   --   ALT 14 14  --   --   --   --   --   ALKPHOS 69 78  --   --   --   --   --   BILITOT 0.6 0.7  --   --   --   --   --   PROT 6.3* 7.4  --   --   --   --   --   ALBUMIN 2.1* 2.7* 2.3* 2.2* 2.3* 1.8* 2.0*   Recent Labs  Lab 03/04/20 1330  LIPASE 28   No results for input(s): AMMONIA in the last 168 hours. Diabetic: No results for input(s): HGBA1C in the last 72 hours. Recent Labs  Lab 03/07/20 0750 03/07/20 1157 03/07/20 1652 03/07/20 1957 03/08/20 0734  GLUCAP 208* 170* 269* 193* 288*   Cardiac Enzymes: Recent Labs  Lab 03/06/20 0246 03/07/20 0429 03/08/20 0451  CKTOTAL 645* 504* 295*   No results  for input(s): PROBNP in the last 8760 hours. Coagulation Profile: No results for input(s): INR, PROTIME in the last 168 hours. Thyroid Function Tests: No results for input(s): TSH, T4TOTAL, FREET4, T3FREE, THYROIDAB in the last 72 hours. Lipid Profile: No results for input(s): CHOL, HDL, LDLCALC, TRIG, CHOLHDL, LDLDIRECT in the last 72 hours. Anemia Panel: No results for input(s): VITAMINB12, FOLATE, FERRITIN, TIBC, IRON, RETICCTPCT in the last 72 hours. Urine analysis:    Component Value Date/Time   COLORURINE STRAW (A) 03/06/2020 1726   APPEARANCEUR CLEAR 03/06/2020 1726   LABSPEC 1.008 03/06/2020 1726   PHURINE 7.0 03/06/2020 1726   GLUCOSEU >=500 (A) 03/06/2020 1726   HGBUR NEGATIVE 03/06/2020 1726   BILIRUBINUR NEGATIVE 03/06/2020 1726   KETONESUR NEGATIVE 03/06/2020 1726   PROTEINUR >=300 (A) 03/06/2020 1726   UROBILINOGEN 0.2 08/14/2019 1051   NITRITE NEGATIVE 03/06/2020 1726   LEUKOCYTESUR NEGATIVE 03/06/2020 1726   Sepsis Labs: Invalid input(s): PROCALCITONIN, LACTICIDVEN   Time coordinating discharge: 35 minutes  SIGNED:  Mercy Riding, MD  [facility] 03/08/2020, 3:36 PM  If 7PM-7AM, please contact night-coverage [email or web address]

## 2020-03-08 NOTE — Progress Notes (Signed)
Patient ID: Barbara Donovan, female   DOB: [birth year], 30 y.o.   MRN: [id number] [facility] Progress Note   Assessment/ Plan:   1.  Acute kidney injury on chronic kidney disease stage V: This appears to be hemodynamically mediated in the setting of hypertensive urgency/diabetic ketoacidosis.  With excellent urine output overnight and downtrending BUN/creatinine without any acute electrolyte abnormality.  She appears stable to discharge home today from a renal standpoint with ensuing renal recovery and follow-up as scheduled. 2.  Hypertensive emergency:  Blood pressure improved on hydralazine 75 mg 3 times a day in addition to clonidine, amlodipine and carvedilol-this appears to be a satisfactory regimen for discharge. 3.  Diabetic ketoacidosis: Transitioned yesterday to subcutaneous insulin from insulin drip-glycemic management per primary service. 4.  Hypokalemia: Likely secondary to transcellular shifts with management of DKA as well as urinary losses in the setting of DKA/polyuria-now corrected.  Counseled extensively regarding the importance of glycemic control and risks of osmotic diuresis.  Subjective:   Denies any acute complaints overnight-explicitly denies any chest pain or shortness of breath and does not have any abnormal limb jerking movements.  Informs me that she has appointments with endocrinology and nephrology that her coming up.   Objective:   BP (!) 147/89 (BP Location: Right Arm)   Pulse 72   Temp 98.9 F (37.2 C) (Oral)   Resp 18   Ht '5\' 6"'$  (1.676 m)   Wt 77.1 kg   LMP  (LMP Unknown)   SpO2 98%   BMI 27.44 kg/m   Intake/Output Summary (Last 24 hours) at 03/08/2020 1055 Last data filed at 03/08/2020 1000 Gross per 24 hour  Intake 1560 ml  Output 3900 ml  Net -2340 ml   Weight change:   Physical Exam: Gen: Sitting up comfortably in bed, eating breakfast CVS: Pulse regular rhythm, normal rate, S1 and S2 normal Resp: Clear to auscultation  bilaterally, no rales/rhonchi Abd: Soft, obese, nontender, bowel sounds normal Ext: No lower extremity edema noted.  No asterixis.  Imaging: US RENAL  Result Date: 03/06/2020 CLINICAL DATA:  Acute kidney injury EXAM: RENAL / URINARY TRACT ULTRASOUND COMPLETE COMPARISON:  01/25/2020 FINDINGS: Right Kidney: Renal measurements: 13.3 x 7.3 x 7.4 cm = volume: 375 mL. Normal cortical thickness. Increased cortical echogenicity. No mass, hydronephrosis, or shadowing calcification. Left Kidney: Renal measurements: 14.2 x 6.6 x 7.3 cm = volume: 356 mL. Normal cortical thickness. Increased cortical echogenicity. Hyperechoic focus mid LEFT kidney centrally, 14 mm diameter, mildly shadowing, likely nonobstructing calculus. No mass or hydronephrosis. Bladder: Appears normal for degree of bladder distention. BILATERAL ureteral jets noted. Other: N/A IMPRESSION: Probable 14 mm nonobstructing calculus mid LEFT kidney. Medical renal disease changes of both kidneys. No evidence of renal mass or hydronephrosis. Electronically Signed   By: Lavonia Dana M.D.   On: 03/06/2020 14:56    Labs: BMET Recent Labs  Lab 03/04/20 2200 03/05/20 0256 03/05/20 0922 03/05/20 1316 03/06/20 0246 03/07/20 0429 03/08/20 0451  NA 139 139 143 138 137 136 134*  K 3.8 3.6 3.2* 3.1* 4.2 3.9 4.0  CL 106 106 111 107 108 107 105  CO2 15* 15* 19* 20* 19* 22 19*  GLUCOSE 334* 262* 207* 149* 221* 163* 272*  BUN 39* 41* 41* 40* 41* 45* 51*  CREATININE 4.93* 5.35* 5.47* 5.66* 6.23* 6.01* 5.51*  CALCIUM 8.9 9.4 8.9 8.6* 8.3* 7.7* 8.0*  PHOS  --   --  3.2 3.5 5.4* 4.7* 4.4   CBC Recent Labs  Lab 03/05/20 0256 03/06/20 0246 03/07/20 0429 03/08/20 0451  WBC 10.4 12.0* 7.2 6.2  NEUTROABS 9.1*  --   --   --   HGB 12.9 10.8* 9.7* 10.9*  HCT 41.4 34.4* 31.4* 35.8*  MCV 81.0 82.5 82.8 83.6  PLT 588* 425* 343 358    Medications:    . (feeding supplement) PROSource Plus  30 mL Oral BID BM  . amLODipine  10 mg Oral Daily  .  carvedilol  25 mg Oral BID  . Chlorhexidine Gluconate Cloth  6 each Topical Daily  . cloNIDine  0.2 mg Oral BID  . heparin injection (subcutaneous)  5,000 Units Subcutaneous Q8H  . hydrALAZINE  75 mg Oral TID  . insulin aspart  0-5 Units Subcutaneous QHS  . insulin aspart  0-9 Units Subcutaneous TID WC  . insulin aspart  5 Units Subcutaneous TID WC  . insulin glargine  5 Units Subcutaneous BID  . latanoprost  1 drop Left Eye QHS  . metoCLOPramide (REGLAN) injection  5 mg Intravenous Q6H  . multivitamin  1 tablet Oral QHS  . pantoprazole  40 mg Oral Daily  . sertraline  100 mg Oral q morning  . sodium bicarbonate  325 mg Oral QID  . traZODone  50 mg Oral QHS   Elmarie Shiley, MD 03/08/2020, 10:55 AM

## 2020-03-21 ENCOUNTER — Ambulatory Visit (INDEPENDENT_AMBULATORY_CARE_PROVIDER_SITE_OTHER): Payer: 59 | Admitting: Endocrinology

## 2020-03-21 ENCOUNTER — Encounter: Payer: Self-pay | Admitting: Endocrinology

## 2020-03-21 ENCOUNTER — Other Ambulatory Visit: Payer: Self-pay

## 2020-03-21 VITALS — BP 140/84 | HR 75 | Ht 66.0 in | Wt 173.2 lb

## 2020-03-21 DIAGNOSIS — E10319 Type 1 diabetes mellitus with unspecified diabetic retinopathy without macular edema: Secondary | ICD-10-CM

## 2020-03-21 DIAGNOSIS — E1143 Type 2 diabetes mellitus with diabetic autonomic (poly)neuropathy: Secondary | ICD-10-CM

## 2020-03-21 DIAGNOSIS — E1043 Type 1 diabetes mellitus with diabetic autonomic (poly)neuropathy: Secondary | ICD-10-CM | POA: Diagnosis not present

## 2020-03-21 DIAGNOSIS — R112 Nausea with vomiting, unspecified: Secondary | ICD-10-CM | POA: Diagnosis not present

## 2020-03-21 DIAGNOSIS — K3184 Gastroparesis: Secondary | ICD-10-CM | POA: Diagnosis not present

## 2020-03-21 LAB — POCT GLYCOSYLATED HEMOGLOBIN (HGB A1C): Hemoglobin A1C: 7.9 % — AB (ref 4.0–5.6)

## 2020-03-21 MED ORDER — INSULIN LISPRO (1 UNIT DIAL) 100 UNIT/ML (KWIKPEN): PEN_INJECTOR | SUBCUTANEOUS | 0 refills | Status: DC
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
## 2020-03-21 NOTE — Patient Instructions (Addendum)
Let's check a stomach x-ray.  you will receive a phone call, about a day and time for an appointment. please continue the same Lantus, and:  Increase the humalog to 3 times a day (just before each meal) 5-4-4 units.  check your blood sugar 5 times a day: before the 3 meals, and at bedtime.  also check if you have symptoms of your blood sugar being too high or too low.  please keep a record of the readings and bring it to your next appointment here (or you can bring the meter itself).  You can write it on any piece of paper.  please call us sooner if your blood sugar goes below 70, or if you have a lot of readings over 200.   Please come back for a follow-up appointment in 1 month.

## 2020-04-10 ENCOUNTER — Encounter (HOSPITAL_COMMUNITY): Payer: Self-pay

## 2020-04-10 ENCOUNTER — Inpatient Hospital Stay (HOSPITAL_COMMUNITY)
Admission: EM | Admit: 2020-04-10 | Discharge: 2020-04-12 | DRG: 638 | Disposition: A | Discharge status: Home / Self Care | Payer: 59 | Attending: Internal Medicine | Admitting: Internal Medicine

## 2020-04-10 DIAGNOSIS — Z7682 Awaiting organ transplant status: Secondary | ICD-10-CM | POA: Diagnosis not present

## 2020-04-10 DIAGNOSIS — E1022 Type 1 diabetes mellitus with diabetic chronic kidney disease: Secondary | ICD-10-CM | POA: Diagnosis present

## 2020-04-10 DIAGNOSIS — Z833 Family history of diabetes mellitus: Secondary | ICD-10-CM

## 2020-04-10 DIAGNOSIS — K2970 Gastritis, unspecified, without bleeding: Secondary | ICD-10-CM | POA: Diagnosis present

## 2020-04-10 DIAGNOSIS — R000 Tachycardia, unspecified: Secondary | ICD-10-CM | POA: Diagnosis present

## 2020-04-10 DIAGNOSIS — N185 Chronic kidney disease, stage 5: Secondary | ICD-10-CM | POA: Diagnosis present

## 2020-04-10 DIAGNOSIS — Z8249 Family history of ischemic heart disease and other diseases of the circulatory system: Secondary | ICD-10-CM

## 2020-04-10 DIAGNOSIS — Z79899 Other long term (current) drug therapy: Secondary | ICD-10-CM

## 2020-04-10 DIAGNOSIS — Z823 Family history of stroke: Secondary | ICD-10-CM | POA: Diagnosis not present

## 2020-04-10 DIAGNOSIS — J45909 Unspecified asthma, uncomplicated: Secondary | ICD-10-CM | POA: Diagnosis present

## 2020-04-10 DIAGNOSIS — I120 Hypertensive chronic kidney disease with stage 5 chronic kidney disease or end stage renal disease: Secondary | ICD-10-CM | POA: Diagnosis present

## 2020-04-10 DIAGNOSIS — E1010 Type 1 diabetes mellitus with ketoacidosis without coma: Secondary | ICD-10-CM | POA: Diagnosis present

## 2020-04-10 DIAGNOSIS — R112 Nausea with vomiting, unspecified: Secondary | ICD-10-CM | POA: Diagnosis present

## 2020-04-10 DIAGNOSIS — Z20822 Contact with and (suspected) exposure to covid-19: Secondary | ICD-10-CM | POA: Diagnosis present

## 2020-04-10 DIAGNOSIS — Z794 Long term (current) use of insulin: Secondary | ICD-10-CM

## 2020-04-10 DIAGNOSIS — I160 Hypertensive urgency: Secondary | ICD-10-CM | POA: Diagnosis present

## 2020-04-10 DIAGNOSIS — Z825 Family history of asthma and other chronic lower respiratory diseases: Secondary | ICD-10-CM

## 2020-04-10 DIAGNOSIS — E1065 Type 1 diabetes mellitus with hyperglycemia: Secondary | ICD-10-CM | POA: Diagnosis present

## 2020-04-10 DIAGNOSIS — E1110 Type 2 diabetes mellitus with ketoacidosis without coma: Secondary | ICD-10-CM | POA: Diagnosis present

## 2020-04-10 LAB — BASIC METABOLIC PANEL
Anion gap: 16 — ABNORMAL HIGH (ref 5–15)
Anion gap: 15 (ref 5–15)
Anion gap: 18 — ABNORMAL HIGH (ref 5–15)
Anion gap: 16 — ABNORMAL HIGH (ref 5–15)
Anion gap: 14 (ref 5–15)
Anion gap: 9 (ref 5–15)
Anion gap: 10 (ref 5–15)
BUN: 48 mg/dL — ABNORMAL HIGH (ref 6–20)
BUN: 44 mg/dL — ABNORMAL HIGH (ref 6–20)
BUN: 45 mg/dL — ABNORMAL HIGH (ref 6–20)
BUN: 42 mg/dL — ABNORMAL HIGH (ref 6–20)
BUN: 43 mg/dL — ABNORMAL HIGH (ref 6–20)
BUN: 41 mg/dL — ABNORMAL HIGH (ref 6–20)
BUN: 40 mg/dL — ABNORMAL HIGH (ref 6–20)
CO2: 16 mmol/L — ABNORMAL LOW (ref 22–32)
CO2: 16 mmol/L — ABNORMAL LOW (ref 22–32)
CO2: 12 mmol/L — ABNORMAL LOW (ref 22–32)
CO2: 14 mmol/L — ABNORMAL LOW (ref 22–32)
CO2: 14 mmol/L — ABNORMAL LOW (ref 22–32)
CO2: 18 mmol/L — ABNORMAL LOW (ref 22–32)
CO2: 18 mmol/L — ABNORMAL LOW (ref 22–32)
Calcium: 9.1 mg/dL (ref 8.9–10.3)
Calcium: 8.8 mg/dL — ABNORMAL LOW (ref 8.9–10.3)
Calcium: 8.9 mg/dL (ref 8.9–10.3)
Calcium: 9.2 mg/dL (ref 8.9–10.3)
Calcium: 9.3 mg/dL (ref 8.9–10.3)
Calcium: 8.9 mg/dL (ref 8.9–10.3)
Calcium: 8.9 mg/dL (ref 8.9–10.3)
Chloride: 107 mmol/L (ref 98–111)
Chloride: 108 mmol/L (ref 98–111)
Chloride: 109 mmol/L (ref 98–111)
Chloride: 110 mmol/L (ref 98–111)
Chloride: 112 mmol/L — ABNORMAL HIGH (ref 98–111)
Chloride: 114 mmol/L — ABNORMAL HIGH (ref 98–111)
Chloride: 113 mmol/L — ABNORMAL HIGH (ref 98–111)
Creatinine, Ser: 6.13 mg/dL — ABNORMAL HIGH (ref 0.44–1.00)
Creatinine, Ser: 5.65 mg/dL — ABNORMAL HIGH (ref 0.44–1.00)
Creatinine, Ser: 5.51 mg/dL — ABNORMAL HIGH (ref 0.44–1.00)
Creatinine, Ser: 5.56 mg/dL — ABNORMAL HIGH (ref 0.44–1.00)
Creatinine, Ser: 5.72 mg/dL — ABNORMAL HIGH (ref 0.44–1.00)
Creatinine, Ser: 5.83 mg/dL — ABNORMAL HIGH (ref 0.44–1.00)
Creatinine, Ser: 6.07 mg/dL — ABNORMAL HIGH (ref 0.44–1.00)
GFR, Estimated: 9 mL/min — ABNORMAL LOW (ref >60)
GFR, Estimated: 10 mL/min — ABNORMAL LOW (ref >60)
GFR, Estimated: 10 mL/min — ABNORMAL LOW (ref >60)
GFR, Estimated: 10 mL/min — ABNORMAL LOW (ref >60)
GFR, Estimated: 10 mL/min — ABNORMAL LOW (ref >60)
GFR, Estimated: 9 mL/min — ABNORMAL LOW (ref >60)
GFR, Estimated: 9 mL/min — ABNORMAL LOW (ref >60)
Glucose, Bld: 260 mg/dL — ABNORMAL HIGH (ref 70–99)
Glucose, Bld: 147 mg/dL — ABNORMAL HIGH (ref 70–99)
Glucose, Bld: 234 mg/dL — ABNORMAL HIGH (ref 70–99)
Glucose, Bld: 184 mg/dL — ABNORMAL HIGH (ref 70–99)
Glucose, Bld: 184 mg/dL — ABNORMAL HIGH (ref 70–99)
Glucose, Bld: 181 mg/dL — ABNORMAL HIGH (ref 70–99)
Glucose, Bld: 183 mg/dL — ABNORMAL HIGH (ref 70–99)
Potassium: 3.7 mmol/L (ref 3.5–5.1)
Potassium: 4.1 mmol/L (ref 3.5–5.1)
Potassium: 4.2 mmol/L (ref 3.5–5.1)
Potassium: 4 mmol/L (ref 3.5–5.1)
Potassium: 4.1 mmol/L (ref 3.5–5.1)
Potassium: 3.4 mmol/L — ABNORMAL LOW (ref 3.5–5.1)
Potassium: 3.6 mmol/L (ref 3.5–5.1)
Sodium: 139 mmol/L (ref 135–145)
Sodium: 139 mmol/L (ref 135–145)
Sodium: 139 mmol/L (ref 135–145)
Sodium: 140 mmol/L (ref 135–145)
Sodium: 140 mmol/L (ref 135–145)
Sodium: 141 mmol/L (ref 135–145)
Sodium: 141 mmol/L (ref 135–145)

## 2020-04-10 LAB — CBG MONITORING, ED
Glucose-Capillary: 265 mg/dL — ABNORMAL HIGH (ref 70–99)
Glucose-Capillary: 312 mg/dL — ABNORMAL HIGH (ref 70–99)

## 2020-04-10 LAB — GLUCOSE, CAPILLARY
Glucose-Capillary: 139 mg/dL — ABNORMAL HIGH (ref 70–99)
Glucose-Capillary: 166 mg/dL — ABNORMAL HIGH (ref 70–99)
Glucose-Capillary: 170 mg/dL — ABNORMAL HIGH (ref 70–99)
Glucose-Capillary: 171 mg/dL — ABNORMAL HIGH (ref 70–99)
Glucose-Capillary: 176 mg/dL — ABNORMAL HIGH (ref 70–99)
Glucose-Capillary: 177 mg/dL — ABNORMAL HIGH (ref 70–99)
Glucose-Capillary: 177 mg/dL — ABNORMAL HIGH (ref 70–99)
Glucose-Capillary: 183 mg/dL — ABNORMAL HIGH (ref 70–99)
Glucose-Capillary: 183 mg/dL — ABNORMAL HIGH (ref 70–99)
Glucose-Capillary: 184 mg/dL — ABNORMAL HIGH (ref 70–99)
Glucose-Capillary: 190 mg/dL — ABNORMAL HIGH (ref 70–99)
Glucose-Capillary: 217 mg/dL — ABNORMAL HIGH (ref 70–99)
Glucose-Capillary: 223 mg/dL — ABNORMAL HIGH (ref 70–99)
Glucose-Capillary: 232 mg/dL — ABNORMAL HIGH (ref 70–99)

## 2020-04-10 LAB — BETA-HYDROXYBUTYRIC ACID
Beta-Hydroxybutyric Acid: 4.7 mmol/L — ABNORMAL HIGH (ref 0.05–0.27)
Beta-Hydroxybutyric Acid: 2.92 mmol/L — ABNORMAL HIGH (ref 0.05–0.27)
Beta-Hydroxybutyric Acid: 2.86 mmol/L — ABNORMAL HIGH (ref 0.05–0.27)
Beta-Hydroxybutyric Acid: 3.03 mmol/L — ABNORMAL HIGH (ref 0.05–0.27)
Beta-Hydroxybutyric Acid: 0.7 mmol/L — ABNORMAL HIGH (ref 0.05–0.27)
Beta-Hydroxybutyric Acid: 0.81 mmol/L — ABNORMAL HIGH (ref 0.05–0.27)

## 2020-04-10 LAB — COMPREHENSIVE METABOLIC PANEL
ALT: 21 U/L (ref 0–44)
AST: 31 U/L (ref 15–41)
Albumin: 3.5 g/dL (ref 3.5–5.0)
Alkaline Phosphatase: 88 U/L (ref 38–126)
Anion gap: 19 — ABNORMAL HIGH (ref 5–15)
BUN: 49 mg/dL — ABNORMAL HIGH (ref 6–20)
CO2: 14 mmol/L — ABNORMAL LOW (ref 22–32)
Calcium: 9.5 mg/dL (ref 8.9–10.3)
Chloride: 101 mmol/L (ref 98–111)
Creatinine, Ser: 6.25 mg/dL — ABNORMAL HIGH (ref 0.44–1.00)
GFR, Estimated: 9 mL/min — ABNORMAL LOW (ref >60)
Glucose, Bld: 367 mg/dL — ABNORMAL HIGH (ref 70–99)
Potassium: 4.4 mmol/L (ref 3.5–5.1)
Sodium: 134 mmol/L — ABNORMAL LOW (ref 135–145)
Total Bilirubin: 0.7 mg/dL (ref 0.3–1.2)
Total Protein: 8.2 g/dL — ABNORMAL HIGH (ref 6.5–8.1)

## 2020-04-10 LAB — LIPASE, BLOOD: Lipase: 33 U/L (ref 11–51)

## 2020-04-10 LAB — CBC
HCT: 38.2 % (ref 36.0–46.0)
HCT: 33.3 % — ABNORMAL LOW (ref 36.0–46.0)
Hemoglobin: 12.2 g/dL (ref 12.0–15.0)
Hemoglobin: 10.7 g/dL — ABNORMAL LOW (ref 12.0–15.0)
MCH: 25.5 pg — ABNORMAL LOW (ref 26.0–34.0)
MCH: 25.6 pg — ABNORMAL LOW (ref 26.0–34.0)
MCHC: 31.9 g/dL (ref 30.0–36.0)
MCHC: 32.1 g/dL (ref 30.0–36.0)
MCV: 79.7 fL — ABNORMAL LOW (ref 80.0–100.0)
MCV: 79.7 fL — ABNORMAL LOW (ref 80.0–100.0)
Platelets: 559 10*3/uL — ABNORMAL HIGH (ref 150–400)
Platelets: 476 10*3/uL — ABNORMAL HIGH (ref 150–400)
RBC: 4.79 MIL/uL (ref 3.87–5.11)
RBC: 4.18 MIL/uL (ref 3.87–5.11)
RDW: 14.4 % (ref 11.5–15.5)
RDW: 14.5 % (ref 11.5–15.5)
WBC: 11.2 10*3/uL — ABNORMAL HIGH (ref 4.0–10.5)
WBC: 9.9 10*3/uL (ref 4.0–10.5)
nRBC: 0 % (ref 0.0–0.2)
nRBC: 0 % (ref 0.0–0.2)

## 2020-04-10 LAB — RESP PANEL BY RT-PCR (FLU A&B, COVID) ARPGX2
Influenza A by PCR: NEGATIVE
Influenza B by PCR: NEGATIVE
SARS Coronavirus 2 by RT PCR: NEGATIVE

## 2020-04-10 LAB — MRSA PCR SCREENING: MRSA by PCR: NEGATIVE

## 2020-04-10 LAB — I-STAT BETA HCG BLOOD, ED (MC, WL, AP ONLY): I-stat hCG, quantitative: <5 m[IU]/mL (ref <5)

## 2020-04-10 MED ORDER — SODIUM CHLORIDE 0.9 % IV SOLN
25.0000 mg | Freq: Four times a day (QID) | INTRAVENOUS | Status: DC | PRN
  Administered 2020-04-10 – 2020-04-11 (×2): 25 mg via INTRAVENOUS
  Filled 2020-04-10 (×5): qty 1

## 2020-04-10 MED ORDER — METOCLOPRAMIDE HCL 5 MG/ML IJ SOLN
10.0000 mg | Freq: Four times a day (QID) | INTRAMUSCULAR | Status: AC
  Administered 2020-04-10 – 2020-04-11 (×6): 10 mg via INTRAVENOUS
  Filled 2020-04-10 (×6): qty 2

## 2020-04-10 MED ORDER — DEXTROSE 50 % IV SOLN: 0.0000 mL | INTRAVENOUS | Status: DC | PRN

## 2020-04-10 MED ORDER — DEXTROSE IN LACTATED RINGERS 5 % IV SOLN: INTRAVENOUS | Status: DC

## 2020-04-10 MED ORDER — CHLORHEXIDINE GLUCONATE CLOTH 2 % EX PADS
6.0000 | MEDICATED_PAD | Freq: Every day | CUTANEOUS | Status: DC
  Administered 2020-04-10 – 2020-04-11 (×2): 6 via TOPICAL

## 2020-04-10 MED ORDER — LACTATED RINGERS IV BOLUS
1000.0000 mL | Freq: Once | INTRAVENOUS | Status: AC
  Administered 2020-04-10: 1000 mL via INTRAVENOUS

## 2020-04-10 MED ORDER — POTASSIUM CHLORIDE 10 MEQ/100ML IV SOLN
10.0000 meq | INTRAVENOUS | Status: AC
  Administered 2020-04-10 (×2): 10 meq via INTRAVENOUS
  Filled 2020-04-10 (×2): qty 100

## 2020-04-10 MED ORDER — CHLORHEXIDINE GLUCONATE 0.12 % MT SOLN
15.0000 mL | Freq: Two times a day (BID) | OROMUCOSAL | Status: DC
  Administered 2020-04-10 – 2020-04-12 (×4): 15 mL via OROMUCOSAL
  Filled 2020-04-10 (×5): qty 15

## 2020-04-10 MED ORDER — CLONIDINE HCL 0.2 MG/24HR TD PTWK
0.2000 mg | MEDICATED_PATCH | TRANSDERMAL | Status: DC
  Administered 2020-04-10: 0.2 mg via TRANSDERMAL
  Filled 2020-04-10: qty 1

## 2020-04-10 MED ORDER — AMLODIPINE BESYLATE 10 MG PO TABS: 10.0000 mg | ORAL_TABLET | Freq: Every day | ORAL | Status: DC

## 2020-04-10 MED ORDER — DROPERIDOL 2.5 MG/ML IJ SOLN
1.2500 mg | Freq: Once | INTRAMUSCULAR | Status: AC
  Administered 2020-04-10: 1.25 mg via INTRAVENOUS

## 2020-04-10 MED ORDER — SCOPOLAMINE 1 MG/3DAYS TD PT72
1.0000 | MEDICATED_PATCH | TRANSDERMAL | Status: DC
  Administered 2020-04-10: 1.5 mg via TRANSDERMAL
  Filled 2020-04-10: qty 1

## 2020-04-10 MED ORDER — CLONIDINE HCL 0.1 MG PO TABS: 0.2000 mg | ORAL_TABLET | Freq: Two times a day (BID) | ORAL | Status: DC

## 2020-04-10 MED ORDER — LABETALOL HCL 5 MG/ML IV SOLN
0.5000 mg/min | Status: DC
  Administered 2020-04-10: 0.5 mg/min via INTRAVENOUS
  Filled 2020-04-10: qty 80

## 2020-04-10 MED ORDER — CLEVIDIPINE BUTYRATE 0.5 MG/ML IV EMUL
0.0000 mg/h | INTRAVENOUS | Status: DC
  Administered 2020-04-10: 1 mg/h via INTRAVENOUS
  Administered 2020-04-11: 7 mg/h via INTRAVENOUS
  Administered 2020-04-11: 9 mg/h via INTRAVENOUS
  Administered 2020-04-11: 7 mg/h via INTRAVENOUS
  Administered 2020-04-11: 6 mg/h via INTRAVENOUS
  Administered 2020-04-11: 9 mg/h via INTRAVENOUS
  Administered 2020-04-11: 7 mg/h via INTRAVENOUS
  Administered 2020-04-11: 6 mg/h via INTRAVENOUS
  Administered 2020-04-12 (×2): 9 mg/h via INTRAVENOUS
  Administered 2020-04-12: 10 mg/h via INTRAVENOUS
  Filled 2020-04-10 (×18): qty 50

## 2020-04-10 MED ORDER — PANTOPRAZOLE SODIUM 40 MG IV SOLR
40.0000 mg | Freq: Two times a day (BID) | INTRAVENOUS | Status: DC
  Administered 2020-04-10 – 2020-04-12 (×4): 40 mg via INTRAVENOUS
  Filled 2020-04-10 (×4): qty 40

## 2020-04-10 MED ORDER — LACTATED RINGERS IV SOLN: INTRAVENOUS | Status: DC

## 2020-04-10 MED ORDER — INSULIN REGULAR(HUMAN) IN NACL 100-0.9 UT/100ML-% IV SOLN
INTRAVENOUS | Status: DC
  Administered 2020-04-10: 0.5 [IU]/h via INTRAVENOUS
  Administered 2020-04-11: 2 [IU]/h via INTRAVENOUS
  Filled 2020-04-10: qty 100

## 2020-04-10 MED ORDER — CARVEDILOL 12.5 MG PO TABS: 25.0000 mg | ORAL_TABLET | Freq: Two times a day (BID) | ORAL | Status: DC

## 2020-04-10 MED ORDER — SODIUM CHLORIDE 0.9 % IV BOLUS
1000.0000 mL | Freq: Once | INTRAVENOUS | Status: AC
  Administered 2020-04-10: 1000 mL via INTRAVENOUS

## 2020-04-10 MED ORDER — DIPHENHYDRAMINE HCL 50 MG/ML IJ SOLN
25.0000 mg | Freq: Once | INTRAMUSCULAR | Status: AC
  Administered 2020-04-10: 25 mg via INTRAVENOUS
  Filled 2020-04-10: qty 1

## 2020-04-10 MED ORDER — ORAL CARE MOUTH RINSE
15.0000 mL | Freq: Two times a day (BID) | OROMUCOSAL | Status: DC
  Administered 2020-04-10 – 2020-04-12 (×5): 15 mL via OROMUCOSAL

## 2020-04-10 MED ORDER — POTASSIUM CHLORIDE 10 MEQ/100ML IV SOLN: 10.0000 meq | INTRAVENOUS | Status: DC

## 2020-04-10 MED ORDER — ONDANSETRON HCL 4 MG/2ML IJ SOLN
4.0000 mg | Freq: Four times a day (QID) | INTRAMUSCULAR | Status: AC | PRN
  Administered 2020-04-10 (×3): 4 mg via INTRAVENOUS
  Filled 2020-04-10 (×4): qty 2

## 2020-04-10 MED ORDER — RENA-VITE PO TABS
1.0000 | ORAL_TABLET | Freq: Every day | ORAL | Status: DC
  Administered 2020-04-11: 1 via ORAL
  Filled 2020-04-10 (×3): qty 1

## 2020-04-10 MED ORDER — HYDRALAZINE HCL 20 MG/ML IJ SOLN
10.0000 mg | Freq: Four times a day (QID) | INTRAMUSCULAR | Status: DC | PRN
  Administered 2020-04-10: 10 mg via INTRAVENOUS
  Filled 2020-04-10: qty 1

## 2020-04-10 MED ORDER — HYDRALAZINE HCL 50 MG PO TABS
75.0000 mg | ORAL_TABLET | Freq: Three times a day (TID) | ORAL | Status: DC
  Administered 2020-04-11 – 2020-04-12 (×3): 75 mg via ORAL
  Filled 2020-04-10 (×3): qty 1

## 2020-04-10 MED ORDER — ENOXAPARIN SODIUM 30 MG/0.3ML ~~LOC~~ SOLN
30.0000 mg | SUBCUTANEOUS | Status: DC
  Administered 2020-04-10 – 2020-04-12 (×3): 30 mg via SUBCUTANEOUS
  Filled 2020-04-10 (×3): qty 0.3

## 2020-04-10 MED ORDER — INSULIN REGULAR(HUMAN) IN NACL 100-0.9 UT/100ML-% IV SOLN
INTRAVENOUS | Status: DC
  Administered 2020-04-10: 6.5 [IU]/h via INTRAVENOUS
  Filled 2020-04-10: qty 100

## 2020-04-10 MED ORDER — METOPROLOL TARTRATE 5 MG/5ML IV SOLN
5.0000 mg | Freq: Four times a day (QID) | INTRAVENOUS | Status: DC
  Administered 2020-04-10: 5 mg via INTRAVENOUS
  Filled 2020-04-10: qty 5

## 2020-04-10 MED ORDER — LATANOPROST 0.005 % OP SOLN
1.0000 [drp] | Freq: Every day | OPHTHALMIC | Status: DC
  Administered 2020-04-10 – 2020-04-11 (×2): 1 [drp] via OPHTHALMIC
  Filled 2020-04-10: qty 2.5

## 2020-04-10 MED ORDER — DROPERIDOL 2.5 MG/ML IJ SOLN
1.2500 mg | Freq: Once | INTRAMUSCULAR | Status: DC
  Filled 2020-04-10: qty 2

## 2020-04-10 NOTE — ED Triage Notes (Signed)
Pt c/o n/v starting yesterday, denies pain or fevers. Hx type 1 DM and HTN

## 2020-04-10 NOTE — ED Provider Notes (Signed)
[place] DEPT Provider Note   CSN: [id number] Arrival date & time: 04/10/20  0017     History Chief Complaint  Patient presents with  . Emesis    Barbara Donovan is a 30 y.o. female.  30 yo  F with a chief complaints of intractable nausea and vomiting.  Going on since yesterday.  Patient has a history of the same.  Mild diffuse abdominal pain.  Nausea vomiting episode feels similar to her prior episodes.  The history is provided by the patient.  Emesis Associated symptoms: abdominal pain   Associated symptoms: no arthralgias, no chills, no fever, no headaches and no myalgias   Illness Severity:  Moderate Onset quality:  Gradual Duration:  2 days Timing:  Constant Progression:  Worsening Chronicity:  Recurrent Associated symptoms: abdominal pain, nausea and vomiting   Associated symptoms: no chest pain, no congestion, no fever, no headaches, no myalgias, no rhinorrhea, no shortness of breath and no wheezing        Past Medical History:  Diagnosis Date  . Asthma    as a child, no problems as an adult, no inhaler  . Cataract    NS OU  . Chronic hypertension during pregnancy, antepartum 08/19/2017  . Dehydration 01/28/2018  . Depression during pregnancy, antepartum 07/07/2017   6/20: Short trial of zoloft previously, reports didn't help much but also didn't give it a chance Discussed r/b/a SSRIs in pregnancy, agrees to try Zoloft again, rx sent No SI/HI/red flags  . Diabetes ([place])    TYPE I  . Diabetic retinopathy ([place]) 06/09/2017   07/2017 with bilateral severe diabetic non-proliferative retinopathy with macular edema.  Marland Kitchen HTN (hypertension)   . Hypertensive retinopathy    OU  . Hypokalemia 01/22/2018  . Hypomagnesemia 01/28/2018  . Intractable nausea and vomiting 01/22/2018  . Intrauterine growth restriction (IUGR) affecting care of mother 12/22/2017  . Morbid obesity ([place])   . 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 Proteinuria due to  type 1 diabetes mellitus ([place]) 11/02/2017   Baseline Pr: Cr 10.23  . Severe hyperemesis gravidarum 10/30/2017  . Type I diabetes mellitus ([place]) 07/07/2017   Current Diabetic Medications:  Insulin  '[x]'$  Aspirin 81 mg daily after 12 weeks (? A2/B GDM)  Required Referrals for A1GDM or A2GDM: '[x]'$  Diabetes Education and Testing Supplies '[x]'$  Nutrition Cousult  For A2/B GDM or higher classes of DM '[x]'$  Diabetes Education and Testing Supplies '[x]'$  Nutrition Counsult '[x]'$  Fetal ECHO after 20 weeks  '[x]'$  Eye exam for retina evaluation - severe retinopathy 7/19  Base  . Ventricular septal defect (VSD) of fetus in singleton pregnancy, antepartum 09/30/2017   May go to newborn nursery per Dr. Lenard Simmer Echo prior to discharge    Patient Active Problem List   Diagnosis Date Noted  . Diabetic gastroparesis ([place]) 03/04/2020  . Anemia due to stage 5 chronic kidney disease ([place]) 03/04/2020  . Diabetic ketoacidosis without coma associated with type 1 diabetes mellitus ([place]) 02/09/2020  . Hypothermia 01/24/2020  . DKA (diabetic ketoacidoses) 09/14/2019  . COVID-19 virus infection 09/14/2019  . SOB (shortness of breath) 03/27/2018  . Hypomagnesemia 01/28/2018  . Dehydration 01/28/2018  . Intractable nausea and vomiting 01/22/2018  . Hypertensive urgency 01/22/2018  . Hypokalemia 01/22/2018  . Nephropathy, diabetic ([place]) 12/29/2017  . Proteinuria due to type 1 diabetes mellitus ([place]) 11/02/2017  . Chronic hypertension 08/21/2017  . Uncontrolled type 1 diabetes mellitus with stage 5 chronic kidney disease not on chronic dialysis, with long-term current  use of insulin ([place]) 07/07/2017  . Diabetic retinopathy ([place]) 06/09/2017  . Acute depression 11/02/2014  . Asthma 10/30/2013    Past Surgical History:  Procedure Laterality Date  . [place] VITRECTOMY WITH 20 GAUGE MVR PORT FOR MACULAR HOLE Left 07/20/2018   Procedure: 25 GAUGE PARS PLANA VITRECTOMY LEFT EYE;  Surgeon: Bernarda Caffey, MD;  Location: [facility];   Service: Ophthalmology;  Laterality: Left;  . CESAREAN SECTION N/A 12/26/2017   Procedure: CESAREAN SECTION;  Surgeon: Osborne Oman, MD;  Location: [facility];  Service: Obstetrics;  Laterality: N/A;  . EYE EXAMINATION UNDER ANESTHESIA Right 07/20/2018   Procedure: Eye Exam Under Anesthesia RIGHT EYE;  Surgeon: Bernarda Caffey, MD;  Location: [facility];  Service: Ophthalmology;  Laterality: Right;  . EYE SURGERY Left 07/2018  . GAS INSERTION Left 07/19/2019   Procedure: INSERTION OF GAS;  Surgeon: Bernarda Caffey, MD;  Location: [facility];  Service: Ophthalmology;  Laterality: Left;  SF6  . INJECTION OF SILICONE OIL Left 99991111   Procedure: Injection Of Silicone Oil LEFT EYE;  Surgeon: Bernarda Caffey, MD;  Location: [facility];  Service: Ophthalmology;  Laterality: Left;  . LASER PHOTO ABLATION Right 07/20/2018   Procedure: Laser Photo Ablation RIGHT EYE;  Surgeon: Bernarda Caffey, MD;  Location: [facility];  Service: Ophthalmology;  Laterality: Right;  . MEMBRANE PEEL Left 07/20/2018   Procedure: Membrane Peel LEFT EYE;  Surgeon: Bernarda Caffey, MD;  Location: [facility];  Service: Ophthalmology;  Laterality: Left;  . MEMBRANE PEEL Left 07/19/2019   Procedure: MEMBRANE PEEL;  Surgeon: Bernarda Caffey, MD;  Location: [facility];  Service: Ophthalmology;  Laterality: Left;  . MITOMYCIN C APPLICATION Bilateral 99991111   Procedure: Avastin Application;  Surgeon: Bernarda Caffey, MD;  Location: [facility];  Service: Ophthalmology;  Laterality: Bilateral;  . PHOTOCOAGULATION WITH LASER Left 07/20/2018   Procedure: Photocoagulation With Laser LEFT EYE;  Surgeon: Bernarda Caffey, MD;  Location: [facility];  Service: Ophthalmology;  Laterality: Left;  . PHOTOCOAGULATION WITH LASER Left 07/19/2019   Procedure: PHOTOCOAGULATION WITH LASER;  Surgeon: Bernarda Caffey, MD;  Location: [facility];  Service: Ophthalmology;  Laterality: Left;  . RETINAL DETACHMENT SURGERY Left 07/20/2018   Dr. Bernarda Caffey  . SILICON OIL REMOVAL Left 07/19/2019   Procedure: 25g PARS  PLANA VITRECTOMY WITH SILICON OIL REMOVAL;  Surgeon: Bernarda Caffey, MD;  Location: [facility];  Service: Ophthalmology;  Laterality: Left;  . WISDOM TOOTH EXTRACTION       OB History    Gravida  1   Para  1   Term      Preterm  1   AB      Living  1     SAB      IAB      Ectopic      Multiple  0   Live Births  1           Family History  Problem Relation Age of Onset  . Diabetes Mother   . Aneurysm Mother 22  . Seizures Mother   . Diabetes Father   . Cataracts Father   . COPD Father   . Heart attack Father   . Heart disease Father   . Healthy Sister   . Healthy Daughter   . Stroke Maternal Grandfather   . Amblyopia Neg Hx   . Blindness Neg Hx   . Glaucoma Neg Hx   . Macular degeneration Neg Hx   . Retinal detachment Neg Hx   .  Strabismus Neg Hx   . Retinitis pigmentosa Neg Hx   . Colon cancer Neg Hx   . Stomach cancer Neg Hx   . Esophageal cancer Neg Hx   . Pancreatic cancer Neg Hx   . Liver disease Neg Hx     Social History   Tobacco Use  . Smoking status: Never Smoker  . Smokeless tobacco: Never Used  Vaping Use  . Vaping Use: Never used  Substance Use Topics  . Alcohol use: Not Currently    Comment: SOCIALLY  . Drug use: Never    Home Medications Prior to Admission medications   Medication Sig Start Date End Date Taking? Authorizing Provider  amLODipine (NORVASC) 10 MG tablet Take 1 tablet (10 mg total) by mouth daily. 03/09/20  Yes Mercy Riding, MD  carvedilol (COREG) 25 MG tablet Take 1 tablet (25 mg total) by mouth 2 (two) times daily. 03/07/19  Yes Imogene Burn, PA-C  cloNIDine (CATAPRES) 0.2 MG tablet Take 1 tablet (0.2 mg total) by mouth 2 (two) times daily. 03/08/20  Yes Mercy Riding, MD  Continuous Blood Gluc Sensor MISC 1 each by Does not apply route as directed. Use as directed every 14 days. May dispense FreeStyle [employer] or similar. 01/27/20  Yes Antonieta Pert, MD  hydrALAZINE (APRESOLINE) 25 MG tablet Take 3 tablets  (75 mg total) by mouth 3 (three) times daily. 03/08/20   Mercy Riding, MD  insulin glargine (LANTUS SOLOSTAR) 100 UNIT/ML Solostar Pen Inject 10 Units into the skin daily. 03/08/20   Mercy Riding, MD  insulin lispro (HUMALOG KWIKPEN) 100 UNIT/ML KwikPen 3 times a day (just before each meal) 5-4-4 units 03/21/20   Renato Shin, MD  latanoprost (XALATAN) 0.005 % ophthalmic solution Place 1 drop into the left eye at bedtime. 02/28/20   Bernarda Caffey, MD  metoCLOPramide (REGLAN) 5 MG tablet Take 1 tablet (5 mg total) by mouth every 8 (eight) hours as needed for nausea or vomiting. 02/10/20   Mercy Riding, MD  multivitamin (RENA-VIT) TABS tablet Take 1 tablet by mouth at bedtime. 03/08/20   Mercy Riding, MD  pantoprazole (PROTONIX) 40 MG tablet Take 1 tablet (40 mg total) by mouth daily. 01/31/20   Ladene Artist, MD  sertraline (ZOLOFT) 100 MG tablet Take 100 mg by mouth every morning. 02/15/20   [provider]  sodium bicarbonate 325 MG tablet Take 325 mg by mouth 4 (four) times daily.    [provider]  traZODone (DESYREL) 50 MG tablet Take 50 mg by mouth at bedtime. 02/16/20   [provider]    Allergies    Patient has no known allergies.  Review of Systems   Review of Systems  Constitutional: Negative for chills and fever.  HENT: Negative for congestion and rhinorrhea.   Eyes: Negative for redness and visual disturbance.  Respiratory: Negative for shortness of breath and wheezing.   Cardiovascular: Negative for chest pain and palpitations.  Gastrointestinal: Positive for abdominal pain, nausea and vomiting.  Genitourinary: Negative for dysuria and urgency.  Musculoskeletal: Negative for arthralgias and myalgias.  Skin: Negative for pallor and wound.  Neurological: Negative for dizziness and headaches.    Physical Exam Updated Vital Signs BP (!) 180/98   Pulse 92   Temp 98 F (36.7 C) (Oral)   Resp 20   Ht '5\' 6"'$  (1.676 m)   Wt 74.4 kg   SpO2 100%   BMI  26.47 kg/m   Physical  Exam Vitals and nursing note reviewed.  Constitutional:      General: She is not in acute distress.    Appearance: She is well-developed. She is not diaphoretic.  HENT:     Head: Normocephalic and atraumatic.  Eyes:     Pupils: Pupils are equal, round, and reactive to light.  Cardiovascular:     Rate and Rhythm: Normal rate and regular rhythm.     Heart sounds: No murmur heard. No friction rub. No gallop.   Pulmonary:     Effort: Pulmonary effort is normal.     Breath sounds: No wheezing or rales.  Abdominal:     General: There is no distension.     Palpations: Abdomen is soft.     Tenderness: There is no abdominal tenderness.     Comments: No obvious pain on exam  Musculoskeletal:        General: No tenderness.     Cervical back: Normal range of motion and neck supple.  Skin:    General: Skin is warm and dry.  Neurological:     Mental Status: She is alert and oriented to person, place, and time.  Psychiatric:        Behavior: Behavior normal.     ED Results / Procedures / Treatments   Labs (all labs ordered are listed, but only abnormal results are displayed) Labs Reviewed  COMPREHENSIVE METABOLIC PANEL - Abnormal; Notable for the following components:      Result Value   Sodium 134 (*)    CO2 14 (*)    Glucose, Bld 367 (*)    BUN 49 (*)    Creatinine, Ser 6.25 (*)    Total Protein 8.2 (*)    GFR, Estimated 9 (*)    Anion gap 19 (*)    All other components within normal limits  CBC - Abnormal; Notable for the following components:   WBC 11.2 (*)    MCV 79.7 (*)    MCH 25.5 (*)    Platelets 559 (*)    All other components within normal limits  CBG MONITORING, ED - Abnormal; Notable for the following components:   Glucose-Capillary 265 (*)    All other components within normal limits  RESP PANEL BY RT-PCR (FLU A&B, COVID) ARPGX2  LIPASE, BLOOD  URINALYSIS, ROUTINE W REFLEX MICROSCOPIC  BASIC METABOLIC PANEL  BASIC METABOLIC PANEL   BASIC METABOLIC PANEL  BASIC METABOLIC PANEL  BASIC METABOLIC PANEL  BETA-HYDROXYBUTYRIC ACID  BETA-HYDROXYBUTYRIC ACID  BETA-HYDROXYBUTYRIC ACID  I-STAT BETA HCG BLOOD, ED ([facility], [facility], [facility] ONLY)  CBG MONITORING, ED    EKG None  Radiology No results found.  Procedures Procedures  Procedure note: Ultrasound Guided Peripheral IV Ultrasound guided peripheral 1.88 inch angiocath IV placement performed by me. Indications: Nursing unable to place IV. Details: The antecubital fossa and upper arm were evaluated with a multifrequency linear probe. Patent brachial veins were noted. 1 attempt was made to cannulate a vein under realtime US guidance with successful cannulation of the vein and catheter placement. There is return of non-pulsatile dark red blood. The patient tolerated the procedure well without complications. Images archived electronically.  CPT codes: [phone] and [phone]  Medications Ordered in ED Medications  insulin regular, human (MYXREDLIN) 100 units/ 100 mL infusion (has no administration in time range)  lactated ringers infusion (has no administration in time range)  dextrose 5 % in lactated ringers infusion (has no administration in time range)  dextrose 50 % solution 0-50 mL (has  no administration in time range)  potassium chloride 10 mEq in 100 mL IVPB (has no administration in time range)  lactated ringers bolus 1,000 mL (has no administration in time range)  sodium chloride 0.9 % bolus 1,000 mL (1,000 mLs Intravenous New Bag/Given 04/10/20 0323)  droperidol (INAPSINE) 2.5 MG/ML injection 1.25 mg (1.25 mg Intravenous Given 04/10/20 0323)  diphenhydrAMINE (BENADRYL) injection 25 mg (25 mg Intravenous Given 04/10/20 0328)    ED Course  I have reviewed the triage vital signs and the nursing notes.  Pertinent labs & imaging results that were available during my care of the patient were reviewed by me and considered in my medical decision making (see chart for details).    MDM  Rules/Calculators/A&P                          30 yo F with a chief complaints of nausea and vomiting.  Patient with a history of the same.  Will obtain a laboratory evaluation to evaluate for diabetic ketoacidosis.  Attempt to treat patient's pain and nausea.  IV fluids.  Reassess.  Lab work concerning for DKA.  Will start on insulin drip.  Patient nausea improved on reassessment.  Will discuss with medicine.  CRITICAL CARE Performed by: Cecilio Asper   Total critical care time: 35 minutes  Critical care time was exclusive of separately billable procedures and treating other patients.  Critical care was necessary to treat or prevent imminent or life-threatening deterioration.  Critical care was time spent personally by me on the following activities: development of treatment plan with patient and/or surrogate as well as nursing, discussions with consultants, evaluation of patient's response to treatment, examination of patient, obtaining history from patient or surrogate, ordering and performing treatments and interventions, ordering and review of laboratory studies, ordering and review of radiographic studies, pulse oximetry and re-evaluation of patient's condition.  The patients results and plan were reviewed and discussed.   Any x-rays performed were independently reviewed by myself.   Differential diagnosis were considered with the presenting HPI.  Medications  insulin regular, human (MYXREDLIN) 100 units/ 100 mL infusion (has no administration in time range)  lactated ringers infusion (has no administration in time range)  dextrose 5 % in lactated ringers infusion (has no administration in time range)  dextrose 50 % solution 0-50 mL (has no administration in time range)  potassium chloride 10 mEq in 100 mL IVPB (has no administration in time range)  lactated ringers bolus 1,000 mL (has no administration in time range)  sodium chloride 0.9 % bolus 1,000 mL (1,000 mLs  Intravenous New Bag/Given 04/10/20 0323)  droperidol (INAPSINE) 2.5 MG/ML injection 1.25 mg (1.25 mg Intravenous Given 04/10/20 0323)  diphenhydrAMINE (BENADRYL) injection 25 mg (25 mg Intravenous Given 04/10/20 0328)    Vitals:   04/10/20 0028 04/10/20 0029 04/10/20 0228 04/10/20 0300  BP: (!) 166/101  (!) 182/121 (!) 180/98  Pulse: 93  99 92  Resp: '18  20 20  '$ Temp: 98.3 F (36.8 C)  98 F (36.7 C)   TempSrc:   Oral   SpO2: 98%  100% 100%  Weight:  74.4 kg    Height:  '5\' 6"'$  (1.676 m)      Final diagnoses:  DKA, type 1, not at goal [facility])    Admission/ observation were discussed with the admitting physician, patient and/or family and they are comfortable with the plan.    Final Clinical Impression(s) / ED Diagnoses  Final diagnoses:  DKA, type 1, not at goal [facility])    Rx / [place] Orders ED Discharge Orders    None       Deno Etienne, DO 04/10/20 [phone]

## 2020-04-10 NOTE — Progress Notes (Addendum)
This patient was admitted this morning.  Very sick 30 year old with type 1 diabetes stage V CKD hypertension with multiple hospital admissions for DKA intractable nausea and vomiting and hypertensive urgency.  Patient is on renal [facility].  Renal functions have slightly improved with IV fluids.  She remains on insulin drip.  She continues with intractable nausea vomiting and hypertensive urgency.  Review of her chart indicates she had an EGD in January 2022 by Dr. Fuller Plan with evidence of gastritis noted he had recommended Protonix for 1 year. I will start her on Protonix 40 mg twice a day for now and change it once a day on discharge, Reglan IV, scopolamine patch, and labetalol drip. Her blood pressure did not improve with clonidine patch IV Lopressor or hydralazine. Follow-up BMP every 6 Unable to reach family.

## 2020-04-10 NOTE — ED Notes (Signed)
Call from charge nurse progressive care who stated that they are not capable to manage insulin gtt states we need to find another bed placement.

## 2020-04-10 NOTE — ED Notes (Signed)
Was unable to get blood 

## 2020-04-10 NOTE — H&P (Signed)
History and Physical    [name] [id number] DOB: 08-Jul-1990 DOA: 04/10/2020  PCP: Fanny Bien, MD   Patient coming from:  Home  Chief Complaint:  Nausea, vomiting  HPI: Barbara Donovan is a 30 y.o. female with medical history significant for DMT1, HTN, CKD 5, asthma-mild intermittent who presents for evaluation of persistent nausea and vomiting since yesterday a.m. reports has not been tolerating any p.o. intake secondary to the nausea and vomiting.  She has had mild diffuse abdominal pain.  She has not had any injury or trauma to her abdomen.  She states she has not had any fever, chills, night sweats.  She denies any pelvic pain, dysuria or urinary frequency.  She has had DKA in the past and states that this felt similar to how she feels now.  She reports decreased energy level and just feels tired and fatigued.  States she has not had chest pain or palpitations.  She denies cough, shortness of breath, wheezing.  And reports she had a hemoglobin A1c 2 weeks ago that was 6.9.  She reports she is on the transplant list at [facility] for kidney disease.   ED Course: He was found to be in DKA in the emergency room with decreased bicarb and increased anion gap.  Blood sugar was around 350.  She was started on IV fluids and insulin infusion in the emergency room.  Review of Systems:  General: Reports fatigue. Denies fever, chills, weight loss, night sweats.  Denies dizziness.   HENT: Denies head trauma, headache, denies change in hearing, tinnitus.  Denies nasal congestion or bleeding.  Denies sore throat, sores in mouth.  Denies difficulty swallowing Eyes: Denies blurry vision, pain in eye, drainage.  Denies discoloration of eyes. Neck: Denies pain.  Denies swelling.  Denies pain with movement. Cardiovascular: Denies chest pain, palpitations. Denies edema.  Denies orthopnea Respiratory: Denies shortness of breath, cough. Denies wheezing.  Denies sputum  production Gastrointestinal: Reports nausea and vomiting. Denies abdominal pain, swelling. Denies diarrhea. Denies melena.  Denies hematemesis. Musculoskeletal: Denies limitation of movement. Denies deformity or swelling. Denies pain.  Genitourinary: Denies pelvic pain.  Denies urinary frequency or hesitancy.  Denies dysuria.  Skin: Denies rash.  Denies petechiae, purpura, ecchymosis. Neurological: Denies headache.  Denies syncope.  Denies seizure activity.  Denies paresthesia.  Denies slurred speech, drooping face.  Denies visual change. Psychiatric: Denies depression, anxiety. Denies hallucinations.  Past Medical History:  Diagnosis Date  . Asthma    as a child, no problems as an adult, no inhaler  . Cataract    NS OU  . Chronic hypertension during pregnancy, antepartum 08/19/2017  . Dehydration 01/28/2018  . Depression during pregnancy, antepartum 07/07/2017   6/20: Short trial of zoloft previously, reports didn't help much but also didn't give it a chance Discussed r/b/a SSRIs in pregnancy, agrees to try Zoloft again, rx sent No SI/HI/red flags  . Diabetes ([place])    TYPE I  . 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  Marland Kitchen HTN (hypertension)   . Hypertensive retinopathy    OU  . Hypokalemia 01/22/2018  . Hypomagnesemia 01/28/2018  . Intractable nausea and vomiting 01/22/2018  . Intrauterine growth restriction (IUGR) affecting care of mother 12/22/2017  . Morbid obesity ([place])   . Nephropathy, diabetic ([place]) 12/29/2017  . Proteinuria due to type 1 diabetes mellitus ([place]) 11/02/2017   Baseline Pr: Cr 10.23  . Severe hyperemesis gravidarum  10/30/2017  . Type I diabetes mellitus ([place]) 07/07/2017   Current Diabetic Medications:  Insulin  '[x]'$  Aspirin 81 mg daily after 12 weeks (? A2/B GDM)  Required Referrals for A1GDM or A2GDM: '[x]'$  Diabetes Education and Testing Supplies '[x]'$  Nutrition Cousult  For A2/B GDM or  higher classes of DM '[x]'$  Diabetes Education and Testing Supplies '[x]'$  Nutrition Counsult '[x]'$  Fetal ECHO after 20 weeks  '[x]'$  Eye exam for retina evaluation - severe retinopathy 7/19  Base  . Ventricular septal defect (VSD) of fetus in singleton pregnancy, antepartum 09/30/2017   May go to newborn nursery per Dr. Lenard Simmer Echo prior to discharge    Past Surgical History:  Procedure Laterality Date  . [place] VITRECTOMY WITH 20 GAUGE MVR PORT FOR MACULAR HOLE Left 07/20/2018   Procedure: 25 GAUGE PARS PLANA VITRECTOMY LEFT EYE;  Surgeon: Bernarda Caffey, MD;  Location: [place];  Service: Ophthalmology;  Laterality: Left;  . CESAREAN SECTION N/A 12/26/2017   Procedure: CESAREAN SECTION;  Surgeon: Osborne Oman, MD;  Location: [place];  Service: Obstetrics;  Laterality: N/A;  . EYE EXAMINATION UNDER ANESTHESIA Right 07/20/2018   Procedure: Eye Exam Under Anesthesia RIGHT EYE;  Surgeon: Bernarda Caffey, MD;  Location: [place];  Service: Ophthalmology;  Laterality: Right;  . EYE SURGERY Left 07/2018  . GAS INSERTION Left 07/19/2019   Procedure: INSERTION OF GAS;  Surgeon: Bernarda Caffey, MD;  Location: [place];  Service: Ophthalmology;  Laterality: Left;  SF6  . INJECTION OF SILICONE OIL Left 99991111   Procedure: Injection Of Silicone Oil LEFT EYE;  Surgeon: Bernarda Caffey, MD;  Location: [place];  Service: Ophthalmology;  Laterality: Left;  . LASER PHOTO ABLATION Right 07/20/2018   Procedure: Laser Photo Ablation RIGHT EYE;  Surgeon: Bernarda Caffey, MD;  Location: [place];  Service: Ophthalmology;  Laterality: Right;  . MEMBRANE PEEL Left 07/20/2018   Procedure: Membrane Peel LEFT EYE;  Surgeon: Bernarda Caffey, MD;  Location: [place];  Service: Ophthalmology;  Laterality: Left;  . MEMBRANE PEEL Left 07/19/2019   Procedure: MEMBRANE PEEL;  Surgeon: Bernarda Caffey, MD;  Location: [place];  Service: Ophthalmology;  Laterality: Left;  . MITOMYCIN C APPLICATION Bilateral 99991111   Procedure: Avastin Application;   Surgeon: Bernarda Caffey, MD;  Location: [place];  Service: Ophthalmology;  Laterality: Bilateral;  . PHOTOCOAGULATION WITH LASER Left 07/20/2018   Procedure: Photocoagulation With Laser LEFT EYE;  Surgeon: Bernarda Caffey, MD;  Location: [place];  Service: Ophthalmology;  Laterality: Left;  . PHOTOCOAGULATION WITH LASER Left 07/19/2019   Procedure: PHOTOCOAGULATION WITH LASER;  Surgeon: Bernarda Caffey, MD;  Location: [place];  Service: Ophthalmology;  Laterality: Left;  . RETINAL DETACHMENT SURGERY Left 07/20/2018   Dr. Bernarda Caffey  . SILICON OIL REMOVAL Left 07/19/2019   Procedure: 25g PARS PLANA VITRECTOMY WITH SILICON OIL REMOVAL;  Surgeon: Bernarda Caffey, MD;  Location: [place];  Service: Ophthalmology;  Laterality: Left;  . WISDOM TOOTH EXTRACTION      Social History  reports that she has never smoked. She has never used smokeless tobacco. She reports previous alcohol use. She reports that she does not use drugs.  No Known Allergies  Family History  Problem Relation Age of Onset  . Diabetes Mother   . Aneurysm Mother 93  . Seizures Mother   . Diabetes Father   . Cataracts Father   . COPD Father   . Heart attack Father   . Heart disease Father   . Healthy Sister   .  Healthy Daughter   . Stroke Maternal Grandfather   . Amblyopia Neg Hx   . Blindness Neg Hx   . Glaucoma Neg Hx   . Macular degeneration Neg Hx   . Retinal detachment Neg Hx   . Strabismus Neg Hx   . Retinitis pigmentosa Neg Hx   . Colon cancer Neg Hx   . Stomach cancer Neg Hx   . Esophageal cancer Neg Hx   . Pancreatic cancer Neg Hx   . Liver disease Neg Hx      Prior to Admission medications   Medication Sig Start Date End Date Taking? Authorizing Provider  amLODipine (NORVASC) 10 MG tablet Take 1 tablet (10 mg total) by mouth daily. 03/09/20  Yes Mercy Riding, MD  carvedilol (COREG) 25 MG tablet Take 1 tablet (25 mg total) by mouth 2 (two) times daily. 03/07/19  Yes Imogene Burn, PA-C  cloNIDine (CATAPRES) 0.2 MG  tablet Take 1 tablet (0.2 mg total) by mouth 2 (two) times daily. 03/08/20  Yes Mercy Riding, MD  Continuous Blood Gluc Sensor MISC 1 each by Does not apply route as directed. Use as directed every 14 days. May dispense FreeStyle [employer] or similar. 01/27/20  Yes Antonieta Pert, MD  hydrALAZINE (APRESOLINE) 25 MG tablet Take 3 tablets (75 mg total) by mouth 3 (three) times daily. 03/08/20   Mercy Riding, MD  insulin glargine (LANTUS SOLOSTAR) 100 UNIT/ML Solostar Pen Inject 10 Units into the skin daily. 03/08/20   Mercy Riding, MD  insulin lispro (HUMALOG KWIKPEN) 100 UNIT/ML KwikPen 3 times a day (just before each meal) 5-4-4 units 03/21/20   Renato Shin, MD  latanoprost (XALATAN) 0.005 % ophthalmic solution Place 1 drop into the left eye at bedtime. 02/28/20   Bernarda Caffey, MD  metoCLOPramide (REGLAN) 5 MG tablet Take 1 tablet (5 mg total) by mouth every 8 (eight) hours as needed for nausea or vomiting. 02/10/20   Mercy Riding, MD  multivitamin (RENA-VIT) TABS tablet Take 1 tablet by mouth at bedtime. 03/08/20   Mercy Riding, MD  pantoprazole (PROTONIX) 40 MG tablet Take 1 tablet (40 mg total) by mouth daily. 01/31/20   Ladene Artist, MD  sertraline (ZOLOFT) 100 MG tablet Take 100 mg by mouth every morning. 02/15/20   [provider]  sodium bicarbonate 325 MG tablet Take 325 mg by mouth 4 (four) times daily.    [provider]  traZODone (DESYREL) 50 MG tablet Take 50 mg by mouth at bedtime. 02/16/20   [provider]    Physical Exam: Vitals:   04/10/20 0029 04/10/20 0228 04/10/20 0300 04/10/20 0429  BP:  (!) 182/121 (!) 180/98 (!) 163/117  Pulse:  99 92 (!) 104  Resp:  '20 20 14  '$ Temp:  98 F (36.7 C)    TempSrc:  Oral    SpO2:  100% 100% 99%  Weight: 74.4 kg     Height: '5\' 6"'$  (1.676 m)       Constitutional: NAD, calm, comfortable Vitals:   04/10/20 0029 04/10/20 0228 04/10/20 0300 04/10/20 0429  BP:  (!) 182/121 (!) 180/98 (!) 163/117  Pulse:  99  92 (!) 104  Resp:  '20 20 14  '$ Temp:  98 F (36.7 C)    TempSrc:  Oral    SpO2:  100% 100% 99%  Weight: 74.4 kg     Height: '5\' 6"'$  (1.676 m)      General: WDWN, Alert and  oriented x3.  Eyes: EOMI, PERRL,conjunctivae normal.  Sclera nonicteric HENT:  [place]/AT, external ears normal.  Nares patent without epistasis.  Mucous membranes are dry. Posterior pharynx clear of any exudate or lesions. Normal dentition.  Neck: Soft, normal range of motion, supple, no masses, no thyromegaly. Trachea midline Respiratory: clear to auscultation bilaterally, no wheezing, no crackles. Normal respiratory effort. No accessory muscle use.  Cardiovascular: Regular rate and rhythm, no murmurs / rubs / gallops. No extremity edema. 2+ pedal pulses.   Abdomen: Soft, no tenderness, nondistended, no rebound or guarding.  No masses palpated. Bowel sounds normoactive Musculoskeletal: FROM. no cyanosis. Normal muscle tone.  Skin: Warm, dry, intact no rashes, lesions, ulcers. No induration Neurologic: CN 2-12 grossly intact.  Normal speech. Sensation intact. Strength 5/5 in all extremities.   Psychiatric: Normal judgment and insight.  Normal mood.    Labs on Admission: I have personally reviewed following labs and imaging studies  CBC: Recent Labs  Lab 04/10/20 0032  WBC 11.2*  HGB 12.2  HCT 38.2  MCV 79.7*  PLT 559*    Basic Metabolic Panel: Recent Labs  Lab 04/10/20 0032  NA 134*  K 4.4  CL 101  CO2 14*  GLUCOSE 367*  BUN 49*  CREATININE 6.25*  CALCIUM 9.5    GFR: Estimated Creatinine Clearance: 13.7 mL/min (A) (by C-G formula based on SCr of 6.25 mg/dL (H)).  Liver Function Tests: Recent Labs  Lab 04/10/20 0032  AST 31  ALT 21  ALKPHOS 88  BILITOT 0.7  PROT 8.2*  ALBUMIN 3.5    Urine analysis:    Component Value Date/Time   COLORURINE STRAW (A) 03/06/2020 1726   APPEARANCEUR CLEAR 03/06/2020 1726   LABSPEC 1.008 03/06/2020 1726   PHURINE 7.0 03/06/2020 1726   GLUCOSEU >=500 (A)  03/06/2020 1726   HGBUR NEGATIVE 03/06/2020 1726   [place] 03/06/2020 1726   KETONESUR NEGATIVE 03/06/2020 1726   PROTEINUR >=300 (A) 03/06/2020 1726   UROBILINOGEN 0.2 08/14/2019 1051   NITRITE NEGATIVE 03/06/2020 1726   LEUKOCYTESUR NEGATIVE 03/06/2020 1726    Radiological Exams on Admission: No results found.   Assessment/Plan Principal Problem:   Diabetic ketoacidosis without coma associated with type 1 diabetes mellitus  Ms. Devito admitted to progressive care unit on DKA protocol. Started on IVF hydration. Started on insulin infusion.  Potassium supplementation provided.  Monitor BMP every 4 hours.   Active Problems:   Uncontrolled type 1 diabetes mellitus with stage 5 chronic kidney disease not on chronic dialysis, with long-term current use of insulin Will hold basal insulin until DKA resolves.     Hypertensive urgency Continue home medications of Coreg, clonidine, hydralazine, Norvasc. Monitor BP     Intractable nausea and vomiting Zofran provided as needed for nausea and vomiting.  IVF hydration.     CKD 5 Stable. Monitor renal function. Pt states she is on kidney transplant list at [facility].    DVT prophylaxis: Lovenox for DVT prophylaxis Code Status:   Full code Family Communication:  Diagnosis and plan discussed with patient.  Patient verbalized understanding agrees with plan.  Further recommendations to follow as clinically indicated Disposition Plan:   Patient is from:  Home  Anticipated DC to:  Home  Anticipated DC date:  Anticipate more than 2 midnight stay in the hospital to treat acute condition  Anticipated DC barriers: No barriers to discharge identified at this time   Admission status:  Inpatient   Eben Burow MD [facility]  How to contact the [facility] Attending or Consulting provider [place] or covering provider during after hours [place], for this patient?   1. Check the care team in [facility] and look for a)  attending/consulting [facility] provider listed and b) the [facility] team listed 2. Log into [email or web address] and use [place] universal password to access. If you do not have the password, please contact the hospital operator. 3. Locate the [facility] provider you are looking for under [facility] and page to a number that you can be directly reached. 4. If you still have difficulty reaching the provider, please page the [facility] (Director on Call) for the Hospitalists listed on amion for assistance.  04/10/2020, 5:26 AM

## 2020-04-11 LAB — GLUCOSE, CAPILLARY
Glucose-Capillary: 125 mg/dL — ABNORMAL HIGH (ref 70–99)
Glucose-Capillary: 126 mg/dL — ABNORMAL HIGH (ref 70–99)
Glucose-Capillary: 126 mg/dL — ABNORMAL HIGH (ref 70–99)
Glucose-Capillary: 132 mg/dL — ABNORMAL HIGH (ref 70–99)
Glucose-Capillary: 132 mg/dL — ABNORMAL HIGH (ref 70–99)
Glucose-Capillary: 132 mg/dL — ABNORMAL HIGH (ref 70–99)
Glucose-Capillary: 139 mg/dL — ABNORMAL HIGH (ref 70–99)
Glucose-Capillary: 142 mg/dL — ABNORMAL HIGH (ref 70–99)
Glucose-Capillary: 147 mg/dL — ABNORMAL HIGH (ref 70–99)
Glucose-Capillary: 149 mg/dL — ABNORMAL HIGH (ref 70–99)
Glucose-Capillary: 151 mg/dL — ABNORMAL HIGH (ref 70–99)
Glucose-Capillary: 156 mg/dL — ABNORMAL HIGH (ref 70–99)
Glucose-Capillary: 158 mg/dL — ABNORMAL HIGH (ref 70–99)
Glucose-Capillary: 161 mg/dL — ABNORMAL HIGH (ref 70–99)
Glucose-Capillary: 164 mg/dL — ABNORMAL HIGH (ref 70–99)
Glucose-Capillary: 169 mg/dL — ABNORMAL HIGH (ref 70–99)
Glucose-Capillary: 169 mg/dL — ABNORMAL HIGH (ref 70–99)
Glucose-Capillary: 170 mg/dL — ABNORMAL HIGH (ref 70–99)
Glucose-Capillary: 185 mg/dL — ABNORMAL HIGH (ref 70–99)
Glucose-Capillary: 193 mg/dL — ABNORMAL HIGH (ref 70–99)
Glucose-Capillary: 201 mg/dL — ABNORMAL HIGH (ref 70–99)

## 2020-04-11 LAB — BASIC METABOLIC PANEL
Anion gap: 12 (ref 5–15)
Anion gap: 10 (ref 5–15)
Anion gap: 14 (ref 5–15)
BUN: 39 mg/dL — ABNORMAL HIGH (ref 6–20)
BUN: 38 mg/dL — ABNORMAL HIGH (ref 6–20)
BUN: 38 mg/dL — ABNORMAL HIGH (ref 6–20)
CO2: 18 mmol/L — ABNORMAL LOW (ref 22–32)
CO2: 16 mmol/L — ABNORMAL LOW (ref 22–32)
CO2: 18 mmol/L — ABNORMAL LOW (ref 22–32)
Calcium: 9.1 mg/dL (ref 8.9–10.3)
Calcium: 8.9 mg/dL (ref 8.9–10.3)
Calcium: 9.1 mg/dL (ref 8.9–10.3)
Chloride: 114 mmol/L — ABNORMAL HIGH (ref 98–111)
Chloride: 115 mmol/L — ABNORMAL HIGH (ref 98–111)
Chloride: 117 mmol/L — ABNORMAL HIGH (ref 98–111)
Creatinine, Ser: 5.77 mg/dL — ABNORMAL HIGH (ref 0.44–1.00)
Creatinine, Ser: 5.93 mg/dL — ABNORMAL HIGH (ref 0.44–1.00)
Creatinine, Ser: 6.31 mg/dL — ABNORMAL HIGH (ref 0.44–1.00)
GFR, Estimated: 10 mL/min — ABNORMAL LOW (ref >60)
GFR, Estimated: 9 mL/min — ABNORMAL LOW (ref >60)
GFR, Estimated: 9 mL/min — ABNORMAL LOW (ref >60)
Glucose, Bld: 195 mg/dL — ABNORMAL HIGH (ref 70–99)
Glucose, Bld: 161 mg/dL — ABNORMAL HIGH (ref 70–99)
Glucose, Bld: 162 mg/dL — ABNORMAL HIGH (ref 70–99)
Potassium: 3.4 mmol/L — ABNORMAL LOW (ref 3.5–5.1)
Potassium: 3.6 mmol/L (ref 3.5–5.1)
Potassium: 3.7 mmol/L (ref 3.5–5.1)
Sodium: 144 mmol/L (ref 135–145)
Sodium: 143 mmol/L (ref 135–145)
Sodium: 147 mmol/L — ABNORMAL HIGH (ref 135–145)

## 2020-04-11 LAB — TRIGLYCERIDES: Triglycerides: 149 mg/dL (ref <150)

## 2020-04-11 LAB — BETA-HYDROXYBUTYRIC ACID
Beta-Hydroxybutyric Acid: 0.24 mmol/L (ref 0.05–0.27)
Beta-Hydroxybutyric Acid: 0.45 mmol/L — ABNORMAL HIGH (ref 0.05–0.27)

## 2020-04-11 MED ORDER — HYDROMORPHONE HCL 1 MG/ML IJ SOLN: 0.5000 mg | Freq: Four times a day (QID) | INTRAMUSCULAR | Status: DC | PRN

## 2020-04-11 MED ORDER — ACETAMINOPHEN 120 MG RE SUPP
120.0000 mg | RECTAL | Status: DC | PRN
  Filled 2020-04-11: qty 1

## 2020-04-11 NOTE — Progress Notes (Signed)
PROGRESS NOTE    Barbara Donovan  [id number] DOB: August 14, 1990 DOA: 04/10/2020 PCP: Fanny Bien, MD    Brief Narrative: HPI per Dr. Tonie Griffith 04/10/2020 Barbara Donovan is a 30 y.o. female with medical history significant for DMT1, HTN, CKD 5, asthma-mild intermittent who presents for evaluation of persistent nausea and vomiting since yesterday a.m. reports has not been tolerating any p.o. intake secondary to the nausea and vomiting.  She has had mild diffuse abdominal pain.  She has not had any injury or trauma to her abdomen.  She states she has not had any fever, chills, night sweats.  She denies any pelvic pain, dysuria or urinary frequency.  She has had DKA in the past and states that this felt similar to how she feels now.  She reports decreased energy level and just feels tired and fatigued.  States she has not had chest pain or palpitations.  She denies cough, shortness of breath, wheezing.  And reports she had a hemoglobin A1c 2 weeks ago that was 6.9.  She reports she is on the transplant list at [facility] for kidney disease.   ED Course: He was found to be in DKA in the emergency room with decreased bicarb and increased anion gap.  Blood sugar was around 350.  She was started on IV fluids and insulin infusion in the emergency room.   Assessment & Plan:   Principal Problem:   Diabetic ketoacidosis without coma associated with type 1 diabetes mellitus ([place]) Active Problems:   Uncontrolled type 1 diabetes mellitus with stage 5 chronic kidney disease not on chronic dialysis, with long-term current use of insulin (HCC)   Intractable nausea and vomiting   Hypertensive urgency   DKA (diabetic ketoacidosis) ([employer])    #1 DKA and type 1 diabetes-patient admitted with intractable nausea vomiting.  She has had multiple hospital admissions for the same.  She has been placed on Endo tool.  Her gap is corrected electrolytes are normalized blood sugar is between 147 and 169.  We  will continue IV insulin because of her intractable nausea and vomiting.   #2 hypertensive urgency she is on multiple medications at home.  She did not respond to labetalol drip this admission.  She was started on Cleviprex drip and she responded well.  Her blood pressures are in 160 to 90s range at this time.  #3  Intractable nausea and vomiting Zofran provided as needed for nausea and vomiting.  IVF hydration.  Patient had EGD last year with evidence of gastritis.  Protonix 40 mg twice a day to be continued.  Continue Reglan scopolamine patch IV fluids.   #4 CKD 5 Stable. Monitor renal function. Pt states she is on kidney transplant list at [facility].  Consulted nephrology.    Estimated body mass index is 26.47 kg/m as calculated from the following:   Height as of this encounter: '5\' 6"'$  (1.676 m).   Weight as of this encounter: 74.4 kg.  DVT prophylaxis: Lovenox Code Status: Full code Family Communication: None at bedside  disposition Plan:  Status is: Inpatient  Dispo: The patient is from: Home              Anticipated d/c is to: Home              Patient currently is not medically stable to d/c.   Difficult to place patient  NA   Consultants: NEPHROLOGY  Procedures: NONE Antimicrobials:NONE  Subjective: Continues to have nausea  and vomiting had a BM 2 days ago. Remains on insulin drip and Cleviprex Tachycardic  Objective: Vitals:   04/11/20 0530 04/11/20 0600 04/11/20 0630 04/11/20 0800  BP: 136/79 (!) 179/98  (!) 172/95  Pulse: (!) 113 (!) 116 (!) 107 (!) 108  Resp: (!) '21 13 14 13  '$ Temp:    97.9 F (36.6 C)  TempSrc:    Oral  SpO2: 99% 98% 99% 99%  Weight:      Height:        Intake/Output Summary (Last 24 hours) at 04/11/2020 1023 Last data filed at 04/11/2020 0943 Gross per 24 hour  Intake 3351.97 ml  Output 1200 ml  Net 2151.97 ml   Filed Weights   04/10/20 0029  Weight: 74.4 kg    Examination:  General exam: Having chills appears  chronically ill Respiratory system: Clear to auscultation. Respiratory effort normal. Cardiovascular system: S1 & S2 heard, RRR. No JVD, murmurs, rubs, gallops or clicks. No pedal edema. Gastrointestinal system: Abdomen is nondistended, soft and nontender. No organomegaly or masses felt. Normal bowel sounds heard. Central nervous system awake alert able to answer all my questions appropriately moves all extremities.   Extremities: Symmetric 5 x 5 power. Skin: No rashes, lesions or ulcers Psychiatry: Judgement and insight appear normal. Mood & affect appropriate.     Data Reviewed: I have personally reviewed following labs and imaging studies  CBC: Recent Labs  Lab 04/10/20 0032 04/10/20 0800  WBC 11.2* 9.9  HGB 12.2 10.7*  HCT 38.2 33.3*  MCV 79.7* 79.7*  PLT 559* 0000000*   Basic Metabolic Panel: Recent Labs  Lab 04/10/20 1430 04/10/20 2029 04/10/20 2248 04/11/20 0229 04/11/20 0721  NA 140 141 141 144 143  K 4.1 3.4* 3.6 3.4* 3.6  CL 112* 114* 113* 114* 115*  CO2 14* 18* 18* 18* 18*  GLUCOSE 184* 181* 183* 195* 161*  BUN 43* 41* 40* 39* 38*  CREATININE 5.72* 5.83* 6.07* 5.77* 5.93*  CALCIUM 9.3 8.9 8.9 9.1 8.9   GFR: Estimated Creatinine Clearance: 14.4 mL/min (A) (by C-G formula based on SCr of 5.93 mg/dL (H)). Liver Function Tests: Recent Labs  Lab 04/10/20 0032  AST 31  ALT 21  ALKPHOS 88  BILITOT 0.7  PROT 8.2*  ALBUMIN 3.5   Recent Labs  Lab 04/10/20 0032  LIPASE 33   No results for input(s): AMMONIA in the last 168 hours. Coagulation Profile: No results for input(s): INR, PROTIME in the last 168 hours. Cardiac Enzymes: No results for input(s): CKTOTAL, CKMB, CKMBINDEX, TROPONINI in the last 168 hours. BNP (last 3 results) No results for input(s): PROBNP in the last 8760 hours. HbA1C: No results for input(s): HGBA1C in the last 72 hours. CBG: Recent Labs  Lab 04/11/20 0433 04/11/20 0554 04/11/20 0647 04/11/20 0749 04/11/20 0848  GLUCAP  164* 169* 169* 147* 156*   Lipid Profile: No results for input(s): CHOL, HDL, LDLCALC, TRIG, CHOLHDL, LDLDIRECT in the last 72 hours. Thyroid Function Tests: No results for input(s): TSH, T4TOTAL, FREET4, T3FREE, THYROIDAB in the last 72 hours. Anemia Panel: No results for input(s): VITAMINB12, FOLATE, FERRITIN, TIBC, IRON, RETICCTPCT in the last 72 hours. Sepsis Labs: No results for input(s): PROCALCITON, LATICACIDVEN in the last 168 hours.  Recent Results (from the past 240 hour(s))  Resp Panel by RT-PCR (Flu A&B, Covid) Nasopharyngeal Swab     Status: None   Collection Time: 04/10/20  4:22 AM   Specimen: Nasopharyngeal Swab; Nasopharyngeal(NP) swabs in vial transport medium  Result Value Ref Range Status   SARS Coronavirus 2 by RT PCR NEGATIVE NEGATIVE Final    Comment: (NOTE) SARS-CoV-2 target nucleic acids are NOT DETECTED.  The SARS-CoV-2 RNA is generally detectable in upper respiratory specimens during the acute phase of infection. The lowest concentration of SARS-CoV-2 viral copies this assay can detect is 138 copies/mL. A negative result does not preclude SARS-Cov-2 infection and should not be used as the sole basis for treatment or other patient management decisions. A negative result may occur with  improper specimen collection/handling, submission of specimen other than nasopharyngeal swab, presence of viral mutation(s) within the areas targeted by this assay, and inadequate number of viral copies(<138 copies/mL). A negative result must be combined with clinical observations, patient history, and epidemiological information. The expected result is Negative.  Fact Sheet for Patients:  EntrepreneurPulse.com.au  Fact Sheet for Healthcare Providers:  IncredibleEmployment.be  This test is no t yet approved or cleared by the [place] FDA and  has been authorized for detection and/or diagnosis of SARS-CoV-2 by FDA under an  Emergency Use Authorization (EUA). This EUA will remain  in effect (meaning this test can be used) for the duration of the COVID-19 declaration under Section 564(b)(1) of the Act, 21 U.S.C.section 360bbb-3(b)(1), unless the authorization is terminated  or revoked sooner.       Influenza A by PCR NEGATIVE NEGATIVE Final   Influenza B by PCR NEGATIVE NEGATIVE Final    Comment: (NOTE) The Xpert Xpress SARS-CoV-2/FLU/RSV plus assay is intended as an aid in the diagnosis of influenza from Nasopharyngeal swab specimens and should not be used as a sole basis for treatment. Nasal washings and aspirates are unacceptable for Xpert Xpress SARS-CoV-2/FLU/RSV testing.  Fact Sheet for Patients: EntrepreneurPulse.com.au  Fact Sheet for Healthcare Providers: IncredibleEmployment.be  This test is not yet approved or cleared by the [place] FDA and has been authorized for detection and/or diagnosis of SARS-CoV-2 by FDA under an Emergency Use Authorization (EUA). This EUA will remain in effect (meaning this test can be used) for the duration of the COVID-19 declaration under Section 564(b)(1) of the Act, 21 U.S.C. section 360bbb-3(b)(1), unless the authorization is terminated or revoked.  Performed at [facility], [place] [address], [place], [place] [address]   MRSA PCR Screening     Status: None   Collection Time: 04/10/20  8:47 AM   Specimen: Nasal Mucosa; Nasopharyngeal  Result Value Ref Range Status   MRSA by PCR NEGATIVE NEGATIVE Final    Comment:        The GeneXpert MRSA Assay (FDA approved for NASAL specimens only), is one component of a comprehensive MRSA colonization surveillance program. It is not intended to diagnose MRSA infection nor to guide or monitor treatment for MRSA infections. Performed at [facility], [place] [address], [place], [place] [address]          Radiology Studies: No  results found.      Scheduled Meds: . chlorhexidine  15 mL Mouth Rinse BID  . Chlorhexidine Gluconate Cloth  6 each Topical Daily  . cloNIDine  0.2 mg Transdermal Weekly  . enoxaparin (LOVENOX) injection  30 mg Subcutaneous Q24H  . hydrALAZINE  75 mg Oral TID  . latanoprost  1 drop Left Eye QHS  . mouth rinse  15 mL Mouth Rinse q12n4p  . metoCLOPramide (REGLAN) injection  10 mg Intravenous Q6H  . multivitamin  1 tablet Oral QHS  . pantoprazole (PROTONIX) IV  40 mg  Intravenous Q12H  . scopolamine  1 patch Transdermal Q72H   Continuous Infusions: . clevidipine 7 mg/hr (04/11/20 0943)  . dextrose 5% lactated ringers Stopped (04/10/20 0452)  . dextrose 5% lactated ringers 125 mL/hr at 04/11/20 0943  . insulin 2.2 mL/hr at 04/11/20 0943  . lactated ringers 125 mL/hr at 04/10/20 0553  . lactated ringers 125 mL/hr at 04/10/20 0749  . promethazine (PHENERGAN) injection Stopped (04/10/20 1017)     LOS: 1 day     Georgette Shell, MD  04/11/2020, 10:23 AM

## 2020-04-11 NOTE — Progress Notes (Signed)
Inpatient Diabetes Program Recommendations  AACE/ADA: New Consensus Statement on Inpatient Glycemic Control (2015)  Target Ranges:  Prepandial:   less than 140 mg/dL      Peak postprandial:   less than 180 mg/dL (1-2 hours)      Critically ill patients:  140 - 180 mg/dL   Lab Results  Component Value Date   GLUCAP 156 (H) 04/11/2020   HGBA1C 7.9 (A) 03/21/2020    Review of Glycemic Control  Diabetes history: DM1 Outpatient Diabetes medications: Lantus 10 units QD, Humalog 5-4-4 units TID with meals Current orders for Inpatient glycemic control: IV insulin per EndoTool for DKA  HgbA1C - 7.9% Endo - [place] CO2 - 18, AG - 10  Inpatient Diabetes Program Recommendations:     IV insulin drip per EndoTool for now.  Continues with intractable nausea, vomiting and hypertensive urgency, and CKD stage V. On renal [facility] list at [facility].  Follow closely.  Thank you. Lorenda Peck, RD, LDN, CDE Inpatient Diabetes Coordinator [phone]

## 2020-04-11 NOTE — Consult Note (Signed)
Renal Service Consult Note [facility]  [name] 04/11/2020 Sol Blazing, MD Requesting Physician: Dr. Rodena Piety, E.   Reason for Consult: Renal failure  HPI: The patient is a 30 y.o. year-old w/ hx of DMT1, HTN, CKD 5 presented w/ N/V x 24 hrs, also abd pain diffuse. Also more fatigue. On transplant list at [facility]. In eD was found to have DKA w/ low CO2 and high AG, BS 350. Barbara Donovan rec'd IVF's and IV insulin. Creat is up in 6-7 range, baseline 5.5- 6.0. Asked to see for renal failure.   Pt seen in room, having nausea, main c/o, along w/ abd discomfort. No SOB or leg swelling.     ROS  denies CP  no joint pain   no HA  no blurry vision  no rash  no diarrhea  no dysuria  no difficulty voiding  no change in urine color    Past Medical History  Past Medical History:  Diagnosis Date  . Asthma    as a child, no problems as an adult, no inhaler  . Cataract    NS OU  . Chronic hypertension during pregnancy, antepartum 08/19/2017  . Dehydration 01/28/2018  . Depression during pregnancy, antepartum 07/07/2017   6/20: Short trial of zoloft previously, reports didn't help much but also didn't give it a chance Discussed r/b/a SSRIs in pregnancy, agrees to try Zoloft again, rx sent No SI/HI/red flags  . Diabetes ([place])    TYPE I  . 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  Marland Kitchen HTN (hypertension)   . Hypertensive retinopathy    OU  . Hypokalemia 01/22/2018  . Hypomagnesemia 01/28/2018  . Intractable nausea and vomiting 01/22/2018  . Intrauterine growth restriction (IUGR) affecting care of mother 12/22/2017  . Morbid obesity ([place])   . Nephropathy, diabetic ([place]) 12/29/2017  . Proteinuria due to type 1 diabetes mellitus ([place]) 11/02/2017   Baseline Pr: Cr 10.23  . Severe hyperemesis gravidarum 10/30/2017  . Type I diabetes mellitus ([place]) 07/07/2017   Current Diabetic Medications:  Insulin   [x]  Aspirin 81 mg daily after 12 weeks (? A2/B GDM)  Required Referrals for A1GDM or A2GDM: [x]  Diabetes Education and Testing Supplies [x]  Nutrition Cousult  For A2/B GDM or higher classes of DM [x]  Diabetes Education and Testing Supplies [x]  Nutrition Counsult [x]  Fetal ECHO after 20 weeks  [x]  Eye exam for retina evaluation - severe retinopathy 7/19  Base  . Ventricular septal defect (VSD) of fetus in singleton pregnancy, antepartum 09/30/2017   May go to newborn nursery per Dr. Lenard Simmer Echo prior to discharge   Past Surgical History  Past Surgical History:  Procedure Laterality Date  . [place] VITRECTOMY WITH 20 GAUGE MVR PORT FOR MACULAR HOLE Left 07/20/2018   Procedure: 25 GAUGE PARS PLANA VITRECTOMY LEFT EYE;  Surgeon: Bernarda Caffey, MD;  Location: [place];  Service: Ophthalmology;  Laterality: Left;  . CESAREAN SECTION N/A 12/26/2017   Procedure: CESAREAN SECTION;  Surgeon: Osborne Oman, MD;  Location: [place];  Service: Obstetrics;  Laterality: N/A;  . EYE EXAMINATION UNDER ANESTHESIA Right 07/20/2018   Procedure: Eye Exam Under Anesthesia RIGHT EYE;  Surgeon: Bernarda Caffey, MD;  Location: [place];  Service: Ophthalmology;  Laterality: Right;  . EYE SURGERY Left 07/2018  . GAS INSERTION Left 07/19/2019   Procedure: INSERTION OF GAS;  Surgeon: Bernarda Caffey, MD;  Location: [place];  Service: Ophthalmology;  Laterality: Left;  SF6  . INJECTION OF SILICONE OIL Left 01/20/2438   Procedure: Injection Of Silicone Oil LEFT EYE;  Surgeon: Bernarda Caffey, MD;  Location: [place];  Service: Ophthalmology;  Laterality: Left;  . LASER PHOTO ABLATION Right 07/20/2018   Procedure: Laser Photo Ablation RIGHT EYE;  Surgeon: Bernarda Caffey, MD;  Location: [place];  Service: Ophthalmology;  Laterality: Right;  . MEMBRANE PEEL Left 07/20/2018   Procedure: Membrane Peel LEFT EYE;  Surgeon: Bernarda Caffey, MD;  Location: [place];  Service: Ophthalmology;  Laterality: Left;  . MEMBRANE PEEL Left 07/19/2019    Procedure: MEMBRANE PEEL;  Surgeon: Bernarda Caffey, MD;  Location: [place];  Service: Ophthalmology;  Laterality: Left;  . MITOMYCIN C APPLICATION Bilateral 1/0/2725   Procedure: Avastin Application;  Surgeon: Bernarda Caffey, MD;  Location: [place];  Service: Ophthalmology;  Laterality: Bilateral;  . PHOTOCOAGULATION WITH LASER Left 07/20/2018   Procedure: Photocoagulation With Laser LEFT EYE;  Surgeon: Bernarda Caffey, MD;  Location: [place];  Service: Ophthalmology;  Laterality: Left;  . PHOTOCOAGULATION WITH LASER Left 07/19/2019   Procedure: PHOTOCOAGULATION WITH LASER;  Surgeon: Bernarda Caffey, MD;  Location: [place];  Service: Ophthalmology;  Laterality: Left;  . RETINAL DETACHMENT SURGERY Left 07/20/2018   Dr. Bernarda Caffey  . SILICON OIL REMOVAL Left 07/19/2019   Procedure: 25g PARS PLANA VITRECTOMY WITH SILICON OIL REMOVAL;  Surgeon: Bernarda Caffey, MD;  Location: [place];  Service: Ophthalmology;  Laterality: Left;  . WISDOM TOOTH EXTRACTION     Family History  Family History  Problem Relation Age of Onset  . Diabetes Mother   . Aneurysm Mother 29  . Seizures Mother   . Diabetes Father   . Cataracts Father   . COPD Father   . Heart attack Father   . Heart disease Father   . Healthy Sister   . Healthy Daughter   . Stroke Maternal Grandfather   . Amblyopia Neg Hx   . Blindness Neg Hx   . Glaucoma Neg Hx   . Macular degeneration Neg Hx   . Retinal detachment Neg Hx   . Strabismus Neg Hx   . Retinitis pigmentosa Neg Hx   . Colon cancer Neg Hx   . Stomach cancer Neg Hx   . Esophageal cancer Neg Hx   . Pancreatic cancer Neg Hx   . Liver disease Neg Hx    Social History  reports that Barbara Donovan has never smoked. Barbara Donovan has never used smokeless tobacco. Barbara Donovan reports previous alcohol use. Barbara Donovan reports that Barbara Donovan does not use drugs. Allergies No Known Allergies Home medications Prior to Admission medications   Medication Sig Start Date End Date Taking? Authorizing Provider  amLODipine (NORVASC) 10 MG tablet  Take 1 tablet (10 mg total) by mouth daily. 03/09/20  Yes Mercy Riding, MD  carvedilol (COREG) 25 MG tablet Take 1 tablet (25 mg total) by mouth 2 (two) times daily. 03/07/19  Yes Imogene Burn, PA-C  cloNIDine (CATAPRES) 0.2 MG tablet Take 1 tablet (0.2 mg total) by mouth 2 (two) times daily. 03/08/20  Yes Mercy Riding, MD  Continuous Blood Gluc Sensor MISC 1 each by Does not apply route as directed. Use as directed every 14 days. May dispense FreeStyle [employer] or similar. 01/27/20  Yes Antonieta Pert, MD  hydrALAZINE (APRESOLINE) 25 MG tablet Take 3 tablets (75 mg total) by mouth 3 (three) times daily. 03/08/20  Yes Mercy Riding, MD  insulin glargine (LANTUS SOLOSTAR) 100 UNIT/ML  Solostar Pen Inject 10 Units into the skin daily. 03/08/20  Yes Mercy Riding, MD  insulin lispro (HUMALOG KWIKPEN) 100 UNIT/ML KwikPen 3 times a day (just before each meal) 5-4-4 units 03/21/20  Yes Renato Shin, MD  latanoprost (XALATAN) 0.005 % ophthalmic solution Place 1 drop into the left eye at bedtime. 02/28/20  Yes Bernarda Caffey, MD  metoCLOPramide (REGLAN) 5 MG tablet Take 1 tablet (5 mg total) by mouth every 8 (eight) hours as needed for nausea or vomiting. 02/10/20  Yes Mercy Riding, MD  multivitamin (RENA-VIT) TABS tablet Take 1 tablet by mouth at bedtime. 03/08/20  Yes Mercy Riding, MD  pantoprazole (PROTONIX) 40 MG tablet Take 1 tablet (40 mg total) by mouth daily. 01/31/20  Yes Ladene Artist, MD  sertraline (ZOLOFT) 100 MG tablet Take 100 mg by mouth every morning. 02/15/20  Yes [provider]  sodium bicarbonate 325 MG tablet Take 325 mg by mouth 4 (four) times daily.   Yes [provider]  traZODone (DESYREL) 50 MG tablet Take 50 mg by mouth at bedtime. 02/16/20  Yes [provider]     Vitals:   04/11/20 0630 04/11/20 0800 04/11/20 1002 04/11/20 1411  BP:  (!) 172/95 (!) 146/78   Pulse: (!) 107 (!) 108 98   Resp: 14 13 17    Temp:  97.9 F (36.6 C)  99.1 F (37.3 C)   TempSrc:  Oral  Oral  SpO2: 99% 99% 97%   Weight:      Height:       Exam Gen alert, no distress No rash, cyanosis or gangrene Sclera anicteric, throat clear  No jvd or bruits Chest clear bilat to bases, no rales/ wheezing RRR no MRG Abd soft ntnd no mass or ascites +bs GU normal MS no joint effusions or deformity Ext no LE or UE edema Neuro is alert, Ox 3 , nf, no asterixis       Home meds:  - insulin glargine 10u qd/ lispro 5-4-4u ac  - coreg 25 bid/ norvasc 10/ clonidine 0.2 bid/ hydralazine 75 tid  - sod bicarb 325 qid  - zoloft 100 qam/ trazodone 50 hs  - protoniix 40/ reglan 5 tid  - prn's/ vitamins/ supplements    Date   Creat  eGFR   2019- 2020  0.56- 0.98   Jan - march 2021 1.15- 1.47       July- aug 2021 2.14- 2.44    Dec 2021  3.73- 4.81 12- 02 Feb 2020  4.15- 4.87 12- 03 Mar 2020  4.39- 6.23    9-13 / AKI 6.23 > dc 5.51        March 24- 25 5.51- 6.31 9- 10     Assessment/ Plan: 1. AKI on CKD 5 - b/l creat is 4.5- 5.5 from early 2022. Creat here 5.5- 6.3.  No gross uremic symptoms, no urgent indication for RRT at this time.  The plan for her is PD which will probably begin in the next few months, f/b Dr Joylene Grapes at [facility]. Will follow.  2. HTN urgency - got IV labetalol and then IV cardene and BP's are improved now. +catapres patch. Reintroduce home BP meds by mouth as nausea improves.  3. DKA - per primary team, improving.  4. N/V - getting reglan w/ hx gastroparesis, and PPI 5. Volume - looks euvolemic on exam. Lower IVF"s to 75 / hr.     Rob Doctor, hospital  MD 04/11/2020, 3:42 PM  Recent Labs  Lab 04/10/20 0032 04/10/20 0800  WBC 11.2* 9.9  HGB 12.2 10.7*   Recent Labs  Lab 04/11/20 0229 04/11/20 0721  K 3.4* 3.7  3.6  BUN 39* 38*  38*  CREATININE 5.77* 6.31*  5.93*  CALCIUM 9.1 9.1  8.9

## 2020-04-12 ENCOUNTER — Inpatient Hospital Stay (HOSPITAL_COMMUNITY): Payer: 59

## 2020-04-12 LAB — BASIC METABOLIC PANEL
Anion gap: 10 (ref 5–15)
BUN: 31 mg/dL — ABNORMAL HIGH (ref 6–20)
CO2: 18 mmol/L — ABNORMAL LOW (ref 22–32)
Calcium: 7.7 mg/dL — ABNORMAL LOW (ref 8.9–10.3)
Chloride: 110 mmol/L (ref 98–111)
Creatinine, Ser: 5.54 mg/dL — ABNORMAL HIGH (ref 0.44–1.00)
GFR, Estimated: 10 mL/min — ABNORMAL LOW (ref >60)
Glucose, Bld: 174 mg/dL — ABNORMAL HIGH (ref 70–99)
Potassium: 3.6 mmol/L (ref 3.5–5.1)
Sodium: 138 mmol/L (ref 135–145)

## 2020-04-12 LAB — GLUCOSE, CAPILLARY
Glucose-Capillary: 130 mg/dL — ABNORMAL HIGH (ref 70–99)
Glucose-Capillary: 136 mg/dL — ABNORMAL HIGH (ref 70–99)
Glucose-Capillary: 139 mg/dL — ABNORMAL HIGH (ref 70–99)
Glucose-Capillary: 148 mg/dL — ABNORMAL HIGH (ref 70–99)
Glucose-Capillary: 155 mg/dL — ABNORMAL HIGH (ref 70–99)
Glucose-Capillary: 161 mg/dL — ABNORMAL HIGH (ref 70–99)
Glucose-Capillary: 162 mg/dL — ABNORMAL HIGH (ref 70–99)
Glucose-Capillary: 169 mg/dL — ABNORMAL HIGH (ref 70–99)
Glucose-Capillary: 170 mg/dL — ABNORMAL HIGH (ref 70–99)
Glucose-Capillary: 173 mg/dL — ABNORMAL HIGH (ref 70–99)
Glucose-Capillary: 187 mg/dL — ABNORMAL HIGH (ref 70–99)
Glucose-Capillary: 194 mg/dL — ABNORMAL HIGH (ref 70–99)
Glucose-Capillary: 198 mg/dL — ABNORMAL HIGH (ref 70–99)

## 2020-04-12 LAB — CREATININE, URINE, RANDOM: Creatinine, Urine: 148.69 mg/dL

## 2020-04-12 LAB — MAGNESIUM: Magnesium: 1.7 mg/dL (ref 1.7–2.4)

## 2020-04-12 LAB — SODIUM, URINE, RANDOM: Sodium, Ur: 52 mmol/L

## 2020-04-12 IMAGING — CT CT HEAD W/O CM
4 series · 17 of 47 positions shown, 19 images · non-contrast
Comparison: [DATE]

CLINICAL DATA: Dizziness

EXAM:
CT HEAD WITHOUT CONTRAST
TECHNIQUE: Contiguous axial images were obtained from the base of the skull
through the vertex without intravenous contrast.

[Series 2: head wo · axial · 0.45mm/px · z∈[-148,-28]mm · 7 of 32 slices shown, 9 images]
[im 4/32  brain]
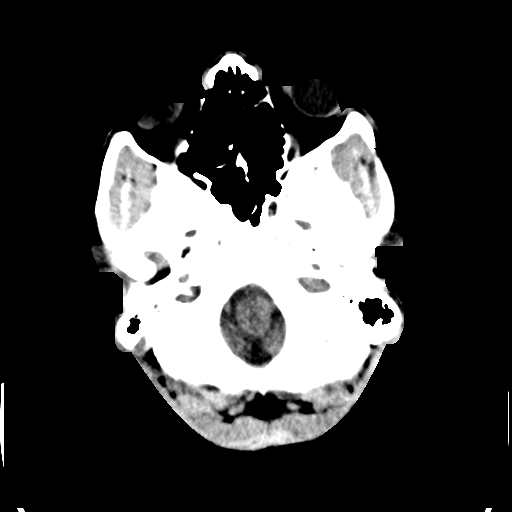
[im 4/32  bone]
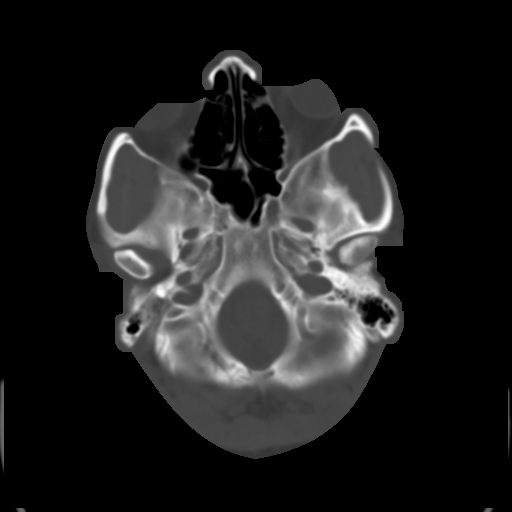
[im 8/32  brain]
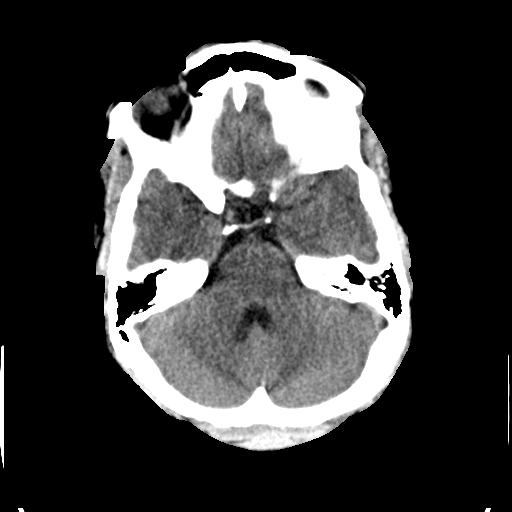
[im 12/32  brain]
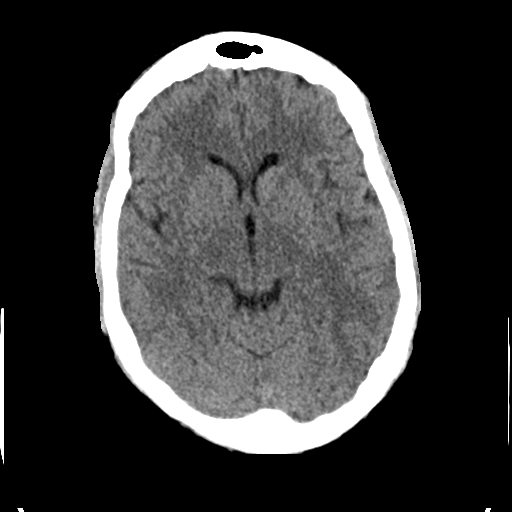
[im 16/32  brain]
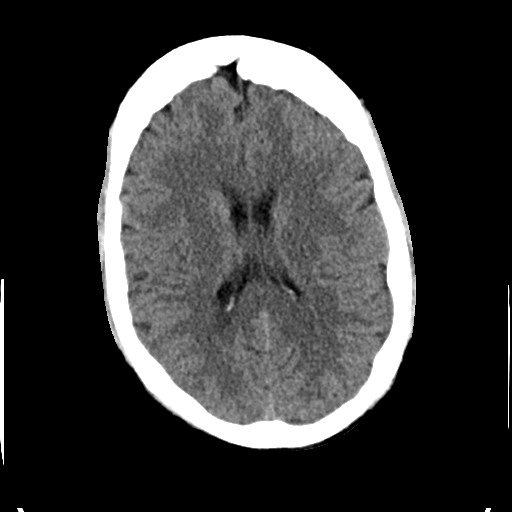
[im 20/32  brain]
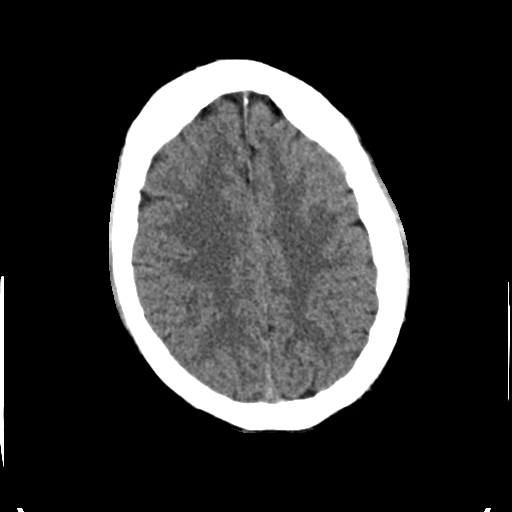
[im 20/32  bone]
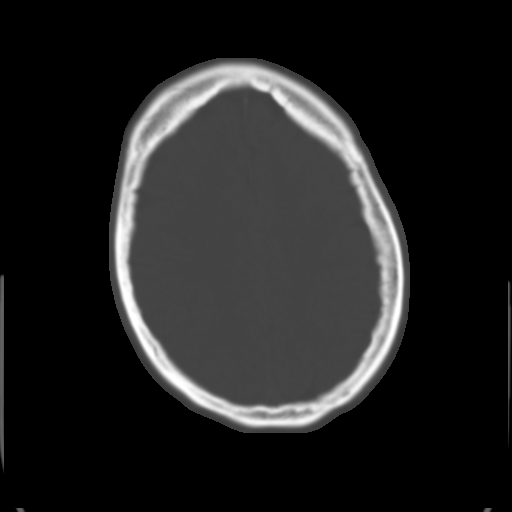
[im 24/32  brain]
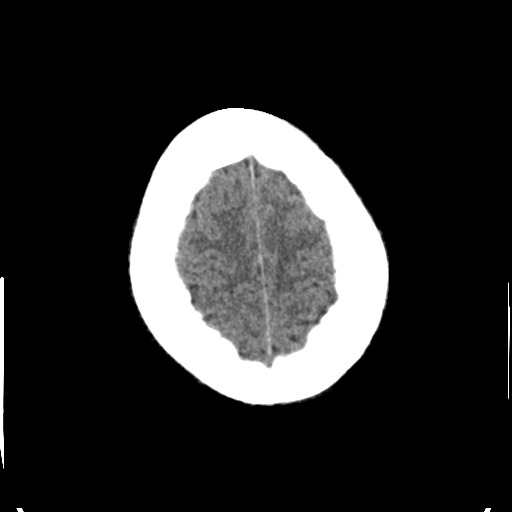
[im 28/32  brain]
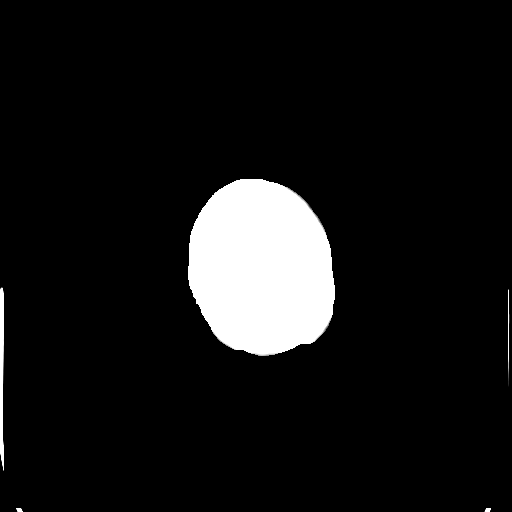

[Series 3: head bone · axial · 0.45mm/px · z∈[-148,-92]mm · 4 of 79 slices shown]
[im 8/79  bone]
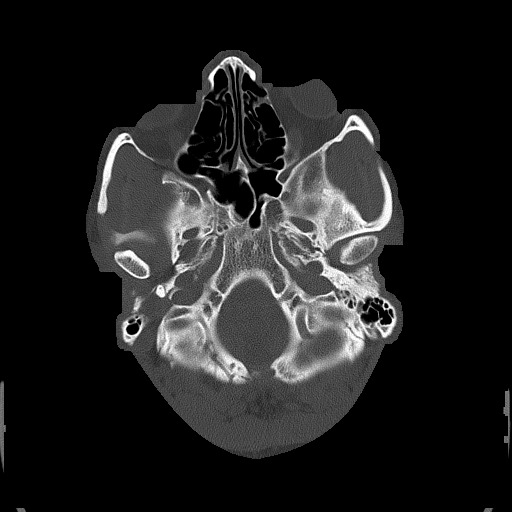
[im 16/79  bone]
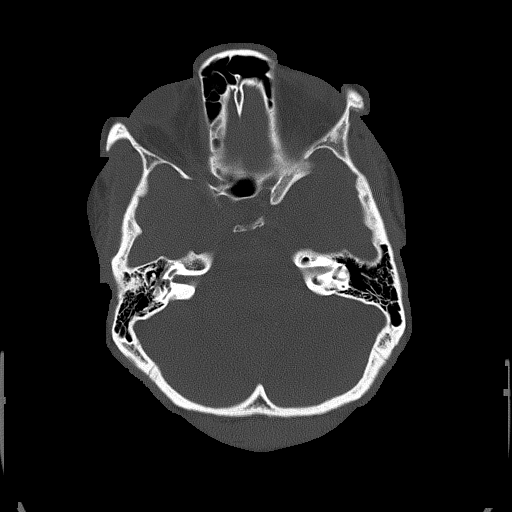
[im 24/79  bone]
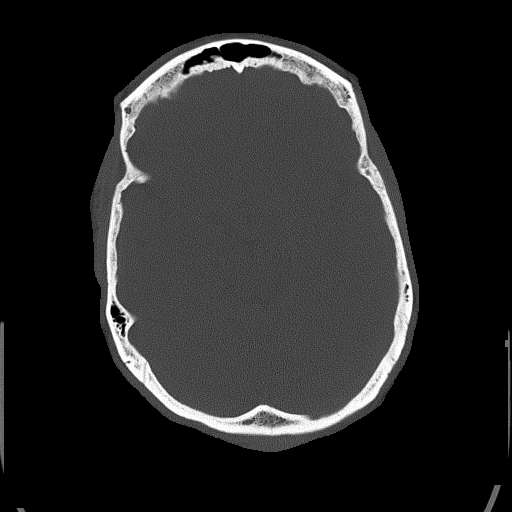
[im 36/79  bone]
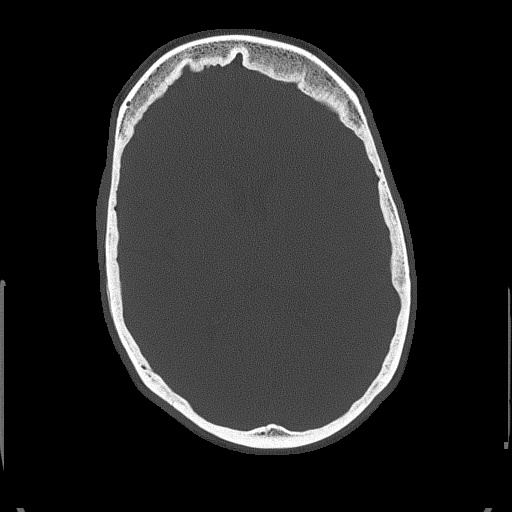

[Series 4: coronal soft tissue · coronal · 0.32mm/px · 3 of 69 slices shown]
[im 23/69  brain]
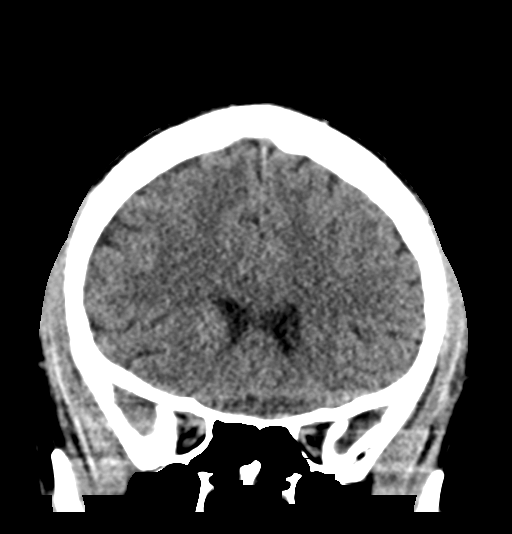
[im 31/69  brain]
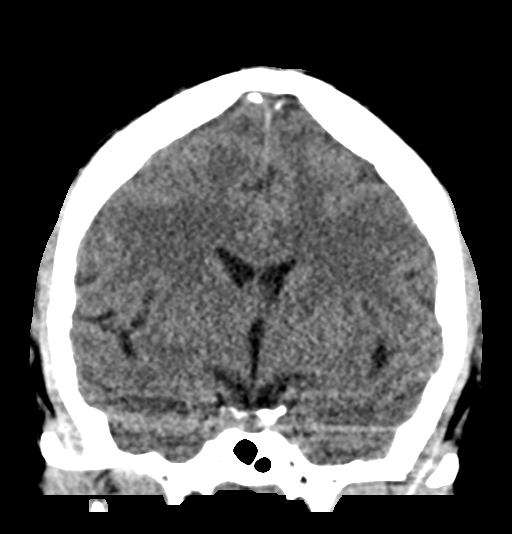
[im 38/69  brain]
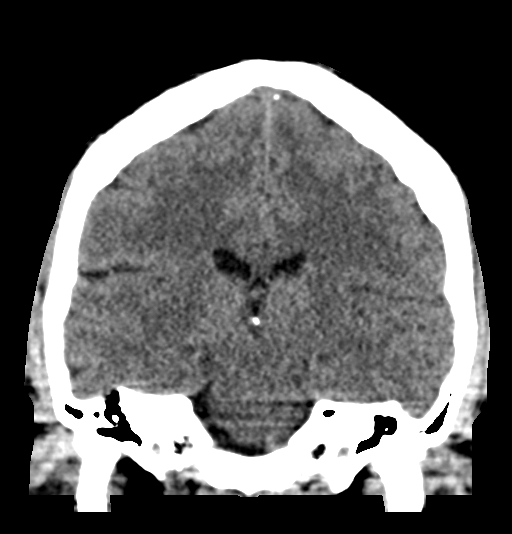

[Series 5: sagittal soft tissue · sagittal · 0.36mm/px · 3 of 57 slices shown]
[im 19/57  brain]
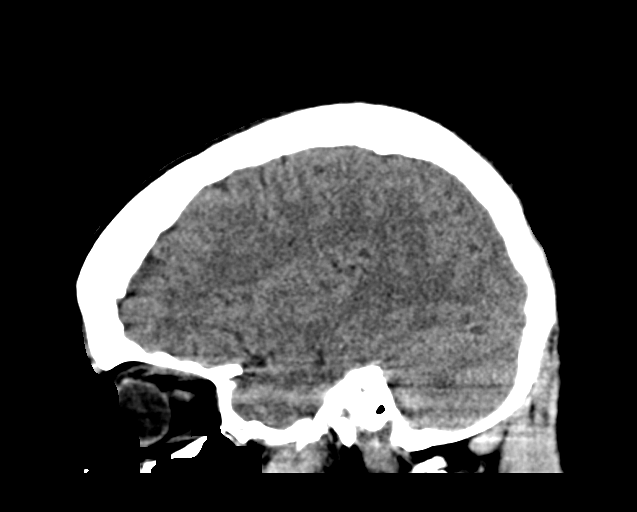
[im 29/57  brain]
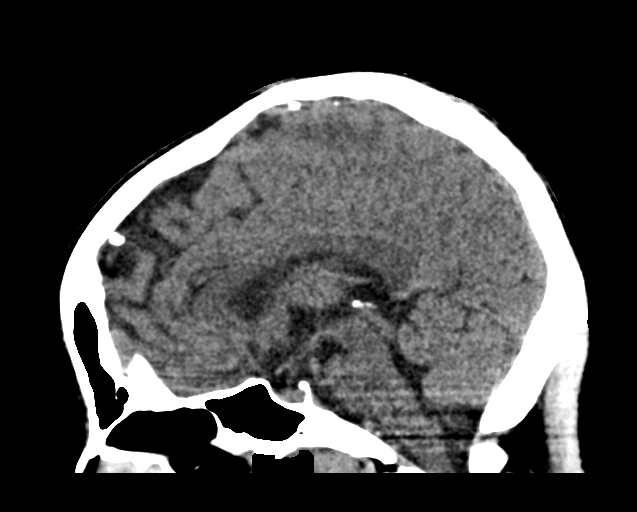
[im 38/57  brain]
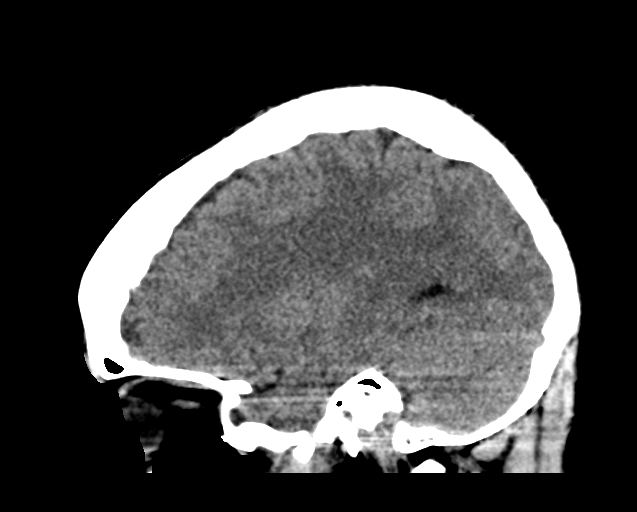

[17 of 47 positions shown; findings below may reference images not displayed]

FINDINGS: Brain: No evidence of acute infarction, hemorrhage, hydrocephalus,
extra-axial collection or mass lesion/mass effect.

Vascular: No hyperdense vessel or unexpected calcification.

Skull: Normal. Negative for fracture or focal lesion.

Sinuses/Orbits: No acute finding.

Other: None.
IMPRESSION: No acute intracranial abnormality noted.

## 2020-04-12 MED ORDER — INSULIN ASPART 100 UNIT/ML ~~LOC~~ SOLN: 0.0000 [IU] | Freq: Every day | SUBCUTANEOUS | Status: DC

## 2020-04-12 MED ORDER — INSULIN ASPART 100 UNIT/ML ~~LOC~~ SOLN
3.0000 [IU] | Freq: Three times a day (TID) | SUBCUTANEOUS | Status: DC
  Administered 2020-04-12: 3 [IU] via SUBCUTANEOUS

## 2020-04-12 MED ORDER — CLONIDINE HCL 0.1 MG PO TABS
0.2000 mg | ORAL_TABLET | Freq: Two times a day (BID) | ORAL | Status: DC
  Administered 2020-04-12: 0.2 mg via ORAL
  Filled 2020-04-12: qty 2

## 2020-04-12 MED ORDER — AMLODIPINE BESYLATE 10 MG PO TABS
10.0000 mg | ORAL_TABLET | Freq: Every day | ORAL | Status: DC
  Administered 2020-04-12: 10 mg via ORAL
  Filled 2020-04-12: qty 1

## 2020-04-12 MED ORDER — SCOPOLAMINE 1 MG/3DAYS TD PT72: 1.0000 | MEDICATED_PATCH | TRANSDERMAL | 12 refills | Status: DC

## 2020-04-12 MED ORDER — CARVEDILOL 12.5 MG PO TABS
25.0000 mg | ORAL_TABLET | Freq: Two times a day (BID) | ORAL | Status: DC
  Administered 2020-04-12 (×2): 25 mg via ORAL
  Filled 2020-04-12 (×2): qty 2

## 2020-04-12 MED ORDER — INSULIN GLARGINE 100 UNIT/ML ~~LOC~~ SOLN
10.0000 [IU] | SUBCUTANEOUS | Status: AC
  Administered 2020-04-12: 10 [IU] via SUBCUTANEOUS
  Filled 2020-04-12: qty 0.1

## 2020-04-12 MED ORDER — INSULIN ASPART 100 UNIT/ML ~~LOC~~ SOLN
0.0000 [IU] | Freq: Three times a day (TID) | SUBCUTANEOUS | Status: DC
  Administered 2020-04-12: 2 [IU] via SUBCUTANEOUS

## 2020-04-12 NOTE — Progress Notes (Signed)
Patient provided discharge education and expressed no questions or concerns. Patient's PIV removed x2. All personal belongings were gathered and placed in a patient belongings bag and given to her. Patient placed in a wheelchair and transported to main entrance where she left in private vehicle with family.

## 2020-04-12 NOTE — Progress Notes (Signed)
[facility] Progress Note  Subjective: seen in ICU, starting to feel better.  Beta-hydroxy is wnl now. N/v and pain better.   Vitals:   04/12/20 1100 04/12/20 1115 04/12/20 1130 04/12/20 1145  BP: 138/78 134/78  127/72  Pulse: 74 86 86 78  Resp: (!) _0 Temp:      TempSrc:      SpO2: 98% 95% 99% 98%  Weight:      Height:        Exam:  alert, nad   no jvd  Chest cta bilat  Cor reg no RG  Abd soft ntnd no ascites   Ext no LE edema   Alert, NF, ox3    Home meds:  - insulin glargine 10u qd/ lispro 5-4-4u ac  - coreg 25 bid/ norvasc 10/ clonidine 0.2 bid/ hydralazine 75 tid  - sod bicarb 325 qid  - zoloft 100 qam/ trazodone 50 hs  - protoniix 40/ reglan 5 tid  - prn's/ vitamins/ supplements    Date                           Creat               eGFR   2019- 2020                0.56- 0.98   Jan - march 2021      1.15- 1.47                                               July- aug 2021          2.14- 2.44    Dec 2021                  3.73- 4.81        12- 02 Feb 2020                  4.15- 4.87        12- 03 Mar 2020                  4.39- 6.23        9-13 / AKI 6.23 > dc 5.51             March 24- 25            5.51- 6.31        9- 10     Assessment/ Plan: 1. AKI on CKD 5 - baseline creat 4.5- 5.5 from early 2022. Peak creat here 6.1 > down to 5.5 today.  Cont IVF"s. No acute indication for RRT at this time.  The plan for her is to do PD which will probably begin in the next few months. She is f/b Dr Joylene Grapes at [facility]. Cont supportive care. Will follow.  2. HTN urgency - Got IV labetalol and then IV cardene. BP's improved w/ the Cardene. Today is getting started back on her home meds and BP's are much better.  3. DKA - per primary team, improving.  4. N/V - getting reglan w/ hx gastroparesis, and PPI 5. Volume - looks euvolemic on exam. Lowered IVF"s to 75 / hr.      Rob Doctor, hospital 04/12/2020, 12:37 PM  Recent Labs  Lab 04/10/20 0032  04/10/20 0550 04/10/20 0800 04/10/20 0954 04/11/20 0721 04/12/20 0232  K 4.4   < > 4.1   < > 3.7  3.6 3.6  BUN 49*   < > 44*   < > 38*  38* 31*  CREATININE 6.25*   < > 5.65*   < > 6.31*  5.93* 5.54*  CALCIUM 9.5   < > 8.8*   < > 9.1  8.9 7.7*  HGB 12.2  --  10.7*  --   --   --    < > = values in this interval not displayed.   Inpatient medications: . amLODipine  10 mg Oral Daily  . carvedilol  25 mg Oral BID WC  . chlorhexidine  15 mL Mouth Rinse BID  . Chlorhexidine Gluconate Cloth  6 each Topical Daily  . cloNIDine  0.2 mg Transdermal Weekly  . cloNIDine  0.2 mg Oral BID  . enoxaparin (LOVENOX) injection  30 mg Subcutaneous Q24H  . hydrALAZINE  75 mg Oral TID  . insulin aspart  0-5 Units Subcutaneous QHS  . insulin aspart  0-9 Units Subcutaneous TID WC  . insulin aspart  3 Units Subcutaneous TID WC  . latanoprost  1 drop Left Eye QHS  . mouth rinse  15 mL Mouth Rinse q12n4p  . multivitamin  1 tablet Oral QHS  . pantoprazole (PROTONIX) IV  40 mg Intravenous Q12H  . scopolamine  1 patch Transdermal Q72H   . clevidipine 9 mg/hr (04/12/20 1047)  . dextrose 5% lactated ringers 75 mL/hr at 04/12/20 0406  . insulin 1.1 Units/hr (04/12/20 0601)  . lactated ringers 125 mL/hr at 04/10/20 0553  . promethazine (PHENERGAN) injection Stopped (04/11/20 1604)   acetaminophen, dextrose, hydrALAZINE, HYDROmorphone (DILAUDID) injection, promethazine (PHENERGAN) injection

## 2020-04-12 NOTE — Progress Notes (Signed)
Inpatient Diabetes Program Recommendations  AACE/ADA: New Consensus Statement on Inpatient Glycemic Control (2015)  Target Ranges:  Prepandial:   less than 140 mg/dL      Peak postprandial:   less than 180 mg/dL (1-2 hours)      Critically ill patients:  140 - 180 mg/dL   Lab Results  Component Value Date   GLUCAP 148 (H) 04/12/2020   HGBA1C 7.9 (A) 03/21/2020    Review of Glycemic Control  Diabetes history: DM 1 Outpatient Diabetes medications: Lantus 10 units QD, Humalog 5-4-4 units TID with meals Current orders for Inpatient glycemic control: iv insulin  Inpatient Diabetes Program Recommendations:    Note Lantus 10 ordered for this am. Elevated renal function.  -   Consider Novolog "very sensitive" 0-6 units tid + hs scale.  Thanks,  Tama Headings RN, MSN, BC-ADM Inpatient Diabetes Coordinator Team Pager [phone] (8a-5p)

## 2020-04-12 NOTE — Discharge Instructions (Signed)
Diabetic Ketoacidosis Diabetic ketoacidosis (DKA) is a serious complication of diabetes. This condition develops when there is not enough insulin in the body. Insulin is a hormone that regulates blood sugar (glucose) levels in the body. Normally, insulin allows glucose to enter the cells in the body. The cells break down glucose for energy. Without enough insulin, the body cannot break down glucose and breaks down fats instead. This leads to high blood glucose levels in the body. It also leads to the production of acids that are called ketones. Ketones are poisonous at high levels. If diabetic ketoacidosis is not treated, it can cause severe dehydration and can lead to a coma or death. What are the causes? This condition develops when a lack of insulin causes the body to break down fats instead of glucose. This may be triggered by:  Stress on the body. This stress can be brought on by an illness.  Infection.  Medicines that raise blood glucose levels.  Not taking or skipping doses of diabetes medicines.  New onset of type 1 diabetes mellitus.  Missing insulin on purpose or by accident.  Interruption of insulin through an insulin pump. This can happen if the cannula that connects you to the insulin pump gets dislodged or kinked. What are the signs or symptoms? Symptoms of this condition include:  Excessive thirst or dry mouth.  Excessive urination.  Abdominal pain.  Nausea or vomiting.  Vision changes.  Fruity or sweet-smelling breath.  Weight loss.  Irritability or confusion.  Rapid breathing.  High blood glucose.  High levels of ketones in the body. To know your ketone levels: ? Collect urine in a small cup. ? Dip a test strip in the urine. ? Wait for it to change color. ? Compare the test strip results to the chart on the container. How is this diagnosed? This condition is diagnosed based on your medical history, a physical exam, and blood tests. You may also  have a urine test to check for ketones. How is this treated? This condition may be treated with:  Fluid replacement. This may be done with IV fluids to correct dehydration.  Correcting high blood glucose with insulin. This may be given through the skin as injections or through an IV.  Electrolyte replacement. Electrolytes are minerals in your blood. Electrolytes such as potassium and sodium may be given in pill form or through an IV.  Antibiotic medicines. These may be prescribed if your condition was caused by an infection. Diabetic ketoacidosis is a serious medical condition. You may need emergency treatment in the hospital so that you can be monitored closely. Follow these instructions at home: Medicines  Take over-the-counter and prescription medicines only as told by your health care provider.  Continue to take insulin and other diabetes medicines as told by your health care provider.  If you were prescribed an antibiotic medicine, take it as told by your health care provider. Do not stop taking the antibiotic even if you start to feel better. Eating and drinking  Drink enough fluids to keep your urine pale yellow.  If you are able to eat, follow your usual diet and drink sugar-free liquids such as water, tea, and sugar-free soft drinks. You can also have sugar-free gelatin or ice pops.  If you are not able to eat, drink liquids that contain sugar in small amounts as you are able. Liquids include fruit juice, regular soft drinks, and sherbet.   Checking ketones and blood glucose  Check your urine for  ketones when you are ill and as told by your health care provider. ? If your blood glucose is 240 mg/dL (13.3 mmol/L) or higher, check your urine ketones every 4 hours. If you have moderate or large ketones, call your health care provider.  Check your blood glucose every day, and as often as told by your health care provider. ? If your blood glucose is high, drink plenty of fluids.  This helps to flush out ketones. ? If your blood glucose is above your target for 2 tests in a row, contact your health care provider.   General instructions  Carry a medical alert card or wear medical alert jewelry that shows that you have diabetes.  Exercise only as told by your health care provider. Do not exercise when your blood glucose is high and you have ketones in your urine.  If you get sick, call your health care provider and begin treatment quickly. Your body often needs extra insulin to fight an illness. Check your blood glucose every 4 hours when you are sick.  Keep all follow-up visits as told by your health care provider. This is important. Where to find more information  American Diabetes Association: diabetes.org Contact a health care provider if:  Your blood glucose level is higher than 240 mg/dL (13.3 mmol/L) for 2 days in a row.  You have moderate or large ketones in your urine.  You have a fever.  You cannot eat or drink without vomiting.  You have been vomiting for more than 2 hours.  You continue to have symptoms of diabetic ketoacidosis.  You develop new symptoms. Get help right away if:  Your blood glucose monitor reads high even when you are taking insulin.  You faint.  You have chest pain.  You have trouble breathing.  You have sudden trouble speaking or swallowing.  You have vomiting or diarrhea that gets worse after 3 hours.  You are unable to stay awake.  You have trouble thinking.  You are severely dehydrated. Symptoms of severe dehydration include: ? Extreme thirst. ? Dry mouth. ? Rapid breathing. These symptoms may represent a serious problem that is an emergency. Do not wait to see if the symptoms will go away. Get medical help right away. Call your local emergency services (911 in the [place] Do not drive yourself to the hospital. Summary  Diabetic ketoacidosis is a serious complication of diabetes. This condition develops when  there is not enough insulin in the body.  This condition is diagnosed based on your medical history, a physical exam, and blood tests. You may also have a urine test to check for ketones.  Diabetic ketoacidosis is a serious medical condition. You may need emergency treatment in the hospital to monitor your condition.  Contact your health care provider if your blood glucose is higher than 240 mg/dL for 2 days in a row or if you have moderate or large ketones in your urine. This information is not intended to replace advice given to you by your health care provider. Make sure you discuss any questions you have with your health care provider. Document Revised: 11/29/2018 Document Reviewed: 11/29/2018 Elsevier Patient Education  [place]. Preventing Diabetic Ketoacidosis Diabetic ketoacidosis (DKA) is a life-threatening complication of diabetes (diabetes mellitus). It develops when there is not enough of a hormone called insulin in the body. If the body does not have enough insulin, it cannot divide (break down) sugar (glucose) into usable cells, so it breaks down fats instead. This  leads to the production of acids (ketones), which can cause the blood to have too much acid in it (acidosis). DKA is a medical emergency that must be treated at a hospital. You may be more likely to develop DKA if you have type 1 diabetes and you take insulin. You can prevent DKA by working closely with your health care provider to manage your diabetes. What nutrition changes can be made?  Follow your meal plan, as directed by your health care provider or diet and nutrition specialist (dietitian).  Eat healthy meals at about the same time every day. Have healthy snacks between meals.  Avoid not eating for long periods of time. Do not skip meals, especially if you are ill.  Avoid regularly eating foods that contain a lot of sugar. Also avoid drinking alcohol. Sugary food and alcohol increase your risk of high  blood glucose (hyperglycemia), which increases your risk for DKA.  Drink enough fluid to keep your urine pale yellow. Dehydration increases your risk for DKA.   What actions can I take to lower my risk? To lower your risk for DKA, manage your diabetes as directed by your health care provider:  Take insulin and other diabetes medicines as directed.  Check your blood glucose every day, as often as directed.  Make a sick day plan in advance with your health care provider. Follow your sick day plan whenever you cannot eat or drink as usual.  Check your urine for ketones as often as directed. ? During times when you are sick, check your ketones every 4-6 hours. ? If you develop symptoms of DKA, check your ketones right away.  If you have ketones in your urine: ? Contact your health care provider right away. ? Do not exercise.  Know the symptoms of DKA so that you can get treatment as soon as possible.  Make sure that people at work, home, and school know how to check your blood glucose, in case you are not able to do it yourself.  Wear a medical alert bracelet or carry a card that says that you have diabetes and lists what medicines you take.   Why are these changes important? Preventing high blood glucose and dehydration helps prevent DKA. You may need to work with your health care provider to adjust your diabetes management plan to lower your risk of developing DKA. DKA can lead to a serious medical emergency that can be life-threatening. Where to find support For more support with preventing DKA:  Talk with your health care provider.  Consider joining a support group. The American Diabetes Association has an online support community at: community.diabetes.org Where to find more information Learn more about preventing DKA from:  American Diabetes Association: diabetes.org  Association of Diabetes Care & Education Specialists: diabeteseducator.org  American Heart Association:  heart.org Contact a health care provider if: You develop symptoms of DKA, such as:  Fatigue.  Weight loss.  Excessive thirst or excessive urination.  Light-headedness or rapid breathing.  Fruity or sweet-smelling breath.  Vision changes, confusion, or irritability.  Pain in the abdomen, nausea, or vomiting.  Feeling warm in your face (flushed). This may or may not include a reddish color coming to your face. If you develop any of these symptoms, do not wait to see if the symptoms will go away. Get medical help right away. Call your local emergency services (911 in the [place] Do not drive yourself to the hospital. Summary  Preventing high blood glucose and dehydration helps prevent  DKA. You may need to work with your health care provider to adjust your diabetes management plan to lower your risk of developing DKA.  Check your urine for ketones as often as directed. You may need to check more often when your blood glucose level is high and when you are ill.  DKA is a medical emergency. Make sure you know the symptoms so that you can get treatment right away. This information is not intended to replace advice given to you by your health care provider. Make sure you discuss any questions you have with your health care provider. Document Revised: 11/07/2019 Document Reviewed: 11/07/2019 Elsevier Patient Education  2021 [employer].   Diabetic Ketoacidosis Diabetic ketoacidosis (DKA) is a serious complication of diabetes. This condition develops when there is not enough insulin in the body. Insulin is a hormone that regulates blood sugar (glucose) levels in the body. Normally, insulin allows glucose to enter the cells in the body. The cells break down glucose for energy. Without enough insulin, the body cannot break down glucose and breaks down fats instead. This leads to high blood glucose levels in the body. It also leads to the production of acids that are called ketones. Ketones are  poisonous at high levels. If diabetic ketoacidosis is not treated, it can cause severe dehydration and can lead to a coma or death. What are the causes? This condition develops when a lack of insulin causes the body to break down fats instead of glucose. This may be triggered by:  Stress on the body. This stress can be brought on by an illness.  Infection.  Medicines that raise blood glucose levels.  Not taking or skipping doses of diabetes medicines.  New onset of type 1 diabetes mellitus.  Missing insulin on purpose or by accident.  Interruption of insulin through an insulin pump. This can happen if the cannula that connects you to the insulin pump gets dislodged or kinked. What are the signs or symptoms? Symptoms of this condition include:  Excessive thirst or dry mouth.  Excessive urination.  Abdominal pain.  Nausea or vomiting.  Vision changes.  Fruity or sweet-smelling breath.  Weight loss.  Irritability or confusion.  Rapid breathing.  High blood glucose.  High levels of ketones in the body. To know your ketone levels: ? Collect urine in a small cup. ? Dip a test strip in the urine. ? Wait for it to change color. ? Compare the test strip results to the chart on the container. How is this diagnosed? This condition is diagnosed based on your medical history, a physical exam, and blood tests. You may also have a urine test to check for ketones. How is this treated? This condition may be treated with:  Fluid replacement. This may be done with IV fluids to correct dehydration.  Correcting high blood glucose with insulin. This may be given through the skin as injections or through an IV.  Electrolyte replacement. Electrolytes are minerals in your blood. Electrolytes such as potassium and sodium may be given in pill form or through an IV.  Antibiotic medicines. These may be prescribed if your condition was caused by an infection. Diabetic ketoacidosis is a  serious medical condition. You may need emergency treatment in the hospital so that you can be monitored closely. Follow these instructions at home: Medicines  Take over-the-counter and prescription medicines only as told by your health care provider.  Continue to take insulin and other diabetes medicines as told by your health care  provider.  If you were prescribed an antibiotic medicine, take it as told by your health care provider. Do not stop taking the antibiotic even if you start to feel better. Eating and drinking  Drink enough fluids to keep your urine pale yellow.  If you are able to eat, follow your usual diet and drink sugar-free liquids such as water, tea, and sugar-free soft drinks. You can also have sugar-free gelatin or ice pops.  If you are not able to eat, drink liquids that contain sugar in small amounts as you are able. Liquids include fruit juice, regular soft drinks, and sherbet.   Checking ketones and blood glucose  Check your urine for ketones when you are ill and as told by your health care provider. ? If your blood glucose is 240 mg/dL (13.3 mmol/L) or higher, check your urine ketones every 4 hours. If you have moderate or large ketones, call your health care provider.  Check your blood glucose every day, and as often as told by your health care provider. ? If your blood glucose is high, drink plenty of fluids. This helps to flush out ketones. ? If your blood glucose is above your target for 2 tests in a row, contact your health care provider.   General instructions  Carry a medical alert card or wear medical alert jewelry that shows that you have diabetes.  Exercise only as told by your health care provider. Do not exercise when your blood glucose is high and you have ketones in your urine.  If you get sick, call your health care provider and begin treatment quickly. Your body often needs extra insulin to fight an illness. Check your blood glucose every 4 hours  when you are sick.  Keep all follow-up visits as told by your health care provider. This is important. Where to find more information  American Diabetes Association: diabetes.org Contact a health care provider if:  Your blood glucose level is higher than 240 mg/dL (13.3 mmol/L) for 2 days in a row.  You have moderate or large ketones in your urine.  You have a fever.  You cannot eat or drink without vomiting.  You have been vomiting for more than 2 hours.  You continue to have symptoms of diabetic ketoacidosis.  You develop new symptoms. Get help right away if:  Your blood glucose monitor reads high even when you are taking insulin.  You faint.  You have chest pain.  You have trouble breathing.  You have sudden trouble speaking or swallowing.  You have vomiting or diarrhea that gets worse after 3 hours.  You are unable to stay awake.  You have trouble thinking.  You are severely dehydrated. Symptoms of severe dehydration include: ? Extreme thirst. ? Dry mouth. ? Rapid breathing. These symptoms may represent a serious problem that is an emergency. Do not wait to see if the symptoms will go away. Get medical help right away. Call your local emergency services (911 in the [place] Do not drive yourself to the hospital. Summary  Diabetic ketoacidosis is a serious complication of diabetes. This condition develops when there is not enough insulin in the body.  This condition is diagnosed based on your medical history, a physical exam, and blood tests. You may also have a urine test to check for ketones.  Diabetic ketoacidosis is a serious medical condition. You may need emergency treatment in the hospital to monitor your condition.  Contact your health care provider if your blood glucose is higher  than 240 mg/dL for 2 days in a row or if you have moderate or large ketones in your urine. This information is not intended to replace advice given to you by your health care  provider. Make sure you discuss any questions you have with your health care provider. Document Revised: 11/29/2018 Document Reviewed: 11/29/2018 Elsevier Patient Education  2021 [employer].

## 2020-04-12 NOTE — Discharge Summary (Signed)
Physician Discharge Summary  Barbara Donovan [id number] DOB: [birth year] DOA: 04/10/2020  PCP: Fanny Bien, MD  Admit date: 04/10/2020 Discharge date: 04/12/2020  Admitted From: Home Disposition: Home  Recommendations for Outpatient Follow-up:  1. Follow up with PCP in 1-2 weeks 2. Please obtain BMP/CBC in one week 3. Please follow with endocrinology Dr. Lissa Merlin  Home Health: None Equipment/Devices: None  Discharge Condition: Stable CODE STATUS: Full code Diet recommendation: Carb modified  brief/Interim Summary:Barbara P Wilsonis a 30 y.o.femalewith medical history significant forDMT1, HTN, CKD 5, asthma-mild intermittent who presents for evaluation of persistent nausea and vomiting sinceyesterday a.m. reports has not been tolerating any p.o. intake secondary to the nausea and vomiting. She has had mild diffuse abdominal pain. She has not had any injury or trauma to her abdomen. She states she has not had any fever, chills, night sweats. She denies any pelvic pain, dysuria or urinary frequency. She has had DKA in the past and states that this felt similar to how she feels now. She reports decreased energy level and just feels tired and fatigued. States she has not had chest pain or palpitations. She denies cough, shortness of breath, wheezing. And reports she had a hemoglobin A1c 2 weeks ago that was 6.9.She reports she is on the transplant list at [facility] for kidney disease.  ED Course:He was found to be in DKA in the emergency room with decreased bicarb and increased anion gap. Blood sugar was around 350. She was started on IV fluids and insulin infusion in the emergency room.    Discharge Diagnoses:  Principal Problem:   Diabetic ketoacidosis without coma associated with type 1 diabetes mellitus ([name]) Active Problems:   Uncontrolled type 1 diabetes mellitus with stage 5 chronic kidney disease not on chronic dialysis, with long-term current use of  insulin ([facility])   Intractable nausea and vomiting   Hypertensive urgency   DKA (diabetic ketoacidosis) ([place])  #1 DKA and type 1 diabetes-patient admitted with intractable nausea vomiting.  She has had multiple hospital admissions for the same.  She has been placed on Endo tool.  Her gap is corrected electrolytes are normalized blood sugar is between 147 and 169.  She was started on a diet once nausea and vomiting resolved.  She was able to tolerate a diet prior to discharge.  She was adamant about being discharged today even though I wanted to keep her in the day as she was still on the Cleviprex drip.    #2 hypertensive urgency she is on multiple medications at home.  She did not respond to labetalol drip this admission.  She was started on Cleviprex drip and she responded well.  Her blood pressures are in 160 to 90s range at this time.  #3Intractable nausea and vomiting resolved. Zofran provided as needed for nausea and vomiting.  Patient had EGD last year with evidence of gastritis. Continue Protonix, scopolamine patch, Reglan.  #4CKD 5 Stable. Monitor renal function. Pt states she is on kidney transplant list at [facility].    She was seen by nephrology during this hospital stay..   Estimated body mass index is 26.47 kg/m as calculated from the following:   Height as of this encounter: '5\' 6"'$  (1.676 m).   Weight as of this encounter: 74.4 kg.  Discharge Instructions  Discharge Instructions    Call MD for:  difficulty breathing, headache or visual disturbances   Complete by: As directed    Call MD for:  persistant nausea  and vomiting   Complete by: As directed    Call MD for:  severe uncontrolled pain   Complete by: As directed    Call MD for:  temperature >100.4   Complete by: As directed    Diet - low sodium heart healthy   Complete by: As directed    Increase activity slowly   Complete by: As directed      Allergies as of 04/12/2020   No Known Allergies      Medication List    TAKE these medications   amLODipine 10 MG tablet Commonly known as: NORVASC Take 1 tablet (10 mg total) by mouth daily.   carvedilol 25 MG tablet Commonly known as: COREG Take 1 tablet (25 mg total) by mouth 2 (two) times daily.   cloNIDine 0.2 MG tablet Commonly known as: CATAPRES Take 1 tablet (0.2 mg total) by mouth 2 (two) times daily.   Continuous Blood Gluc Sensor Misc 1 each by Does not apply route as directed. Use as directed every 14 days. May dispense FreeStyle [employer] or similar.   hydrALAZINE 25 MG tablet Commonly known as: APRESOLINE Take 3 tablets (75 mg total) by mouth 3 (three) times daily.   insulin lispro 100 UNIT/ML KwikPen Commonly known as: HumaLOG KwikPen 3 times a day (just before each meal) 5-4-4 units   Lantus SoloStar 100 UNIT/ML Solostar Pen Generic drug: insulin glargine Inject 10 Units into the skin daily.   latanoprost 0.005 % ophthalmic solution Commonly known as: Xalatan Place 1 drop into the left eye at bedtime.   metoCLOPramide 5 MG tablet Commonly known as: REGLAN Take 1 tablet (5 mg total) by mouth every 8 (eight) hours as needed for nausea or vomiting.   multivitamin Tabs tablet Take 1 tablet by mouth at bedtime.   pantoprazole 40 MG tablet Commonly known as: PROTONIX Take 1 tablet (40 mg total) by mouth daily.   scopolamine 1 MG/3DAYS Commonly known as: TRANSDERM-SCOP Place 1 patch (1.5 mg total) onto the skin every 3 (three) days. Start taking on: April 13, 2020   sertraline 100 MG tablet Commonly known as: ZOLOFT Take 100 mg by mouth every morning.   sodium bicarbonate 325 MG tablet Take 325 mg by mouth 4 (four) times daily.   traZODone 50 MG tablet Commonly known as: DESYREL Take 50 mg by mouth at bedtime.       Follow-up Information    Fanny Bien, MD Follow up.   Specialty: Family Medicine Contact information: [address] [place]  [phone]        Sueanne Margarita, MD .   Specialty: Cardiology Contact information: [id number] [place] [address] [phone]              No Known Allergies  Consultations:  Nephrology   Procedures/Studies: CT HEAD WO CONTRAST  Result Date: 04/12/2020 CLINICAL DATA:  Dizziness EXAM: CT HEAD WITHOUT CONTRAST TECHNIQUE: Contiguous axial images were obtained from the base of the skull through the vertex without intravenous contrast. COMPARISON:  01/24/2020 FINDINGS: Brain: No evidence of acute infarction, hemorrhage, hydrocephalus, extra-axial collection or mass lesion/mass effect. Vascular: No hyperdense vessel or unexpected calcification. Skull: Normal. Negative for fracture or focal lesion. Sinuses/Orbits: No acute finding. Other: None. IMPRESSION: No acute intracranial abnormality noted. Electronically Signed   By: Inez Catalina M.D.   On: 04/12/2020 00:24    (Echo, Carotid, EGD, Colonoscopy, ERCP)    Subjective: Patient is  resting in bed anxious to go home she reports she has not had any vomiting or nausea overnight she does not want to stay here another night even though I encouraged her to stay another night  Discharge Exam: Vitals:   04/12/20 1130 04/12/20 1145  BP:  127/72  Pulse: 86 78  Resp: 17 19  Temp:    SpO2: 99% 98%   Vitals:   04/12/20 1100 04/12/20 1115 04/12/20 1130 04/12/20 1145  BP: 138/78 134/78  127/72  Pulse: 74 86 86 78  Resp: (!) '21 20 17 19  '$ Temp:      TempSrc:      SpO2: 98% 95% 99% 98%  Weight:      Height:        General: Pt is alert, awake, not in acute distress Cardiovascular: RRR, S1/S2 +, no rubs, no gallops Respiratory: CTA bilaterally, no wheezing, no rhonchi Abdominal: Soft, NT, ND, bowel sounds + Extremities: no edema, no cyanosis    The results of significant diagnostics from this hospitalization (including imaging, microbiology, ancillary and laboratory) are listed below for  reference.     Microbiology: Recent Results (from the past 240 hour(s))  Resp Panel by RT-PCR (Flu A&B, Covid) Nasopharyngeal Swab     Status: None   Collection Time: 04/10/20  4:22 AM   Specimen: Nasopharyngeal Swab; Nasopharyngeal(NP) swabs in vial transport medium  Result Value Ref Range Status   SARS Coronavirus 2 by RT PCR NEGATIVE NEGATIVE Final    Comment: (NOTE) SARS-CoV-2 target nucleic acids are NOT DETECTED.  The SARS-CoV-2 RNA is generally detectable in upper respiratory specimens during the acute phase of infection. The lowest concentration of SARS-CoV-2 viral copies this assay can detect is 138 copies/mL. A negative result does not preclude SARS-Cov-2 infection and should not be used as the sole basis for treatment or other patient management decisions. A negative result may occur with  improper specimen collection/handling, submission of specimen other than nasopharyngeal swab, presence of viral mutation(s) within the areas targeted by this assay, and inadequate number of viral copies(<138 copies/mL). A negative result must be combined with clinical observations, patient history, and epidemiological information. The expected result is Negative.  Fact Sheet for Patients:  EntrepreneurPulse.com.au  Fact Sheet for Healthcare Providers:  IncredibleEmployment.be  This test is no t yet approved or cleared by the [place] FDA and  has been authorized for detection and/or diagnosis of SARS-CoV-2 by FDA under an Emergency Use Authorization (EUA). This EUA will remain  in effect (meaning this test can be used) for the duration of the COVID-19 declaration under Section 564(b)(1) of the Act, 21 U.S.C.section 360bbb-3(b)(1), unless the authorization is terminated  or revoked sooner.       Influenza A by PCR NEGATIVE NEGATIVE Final   Influenza B by PCR NEGATIVE NEGATIVE Final    Comment: (NOTE) The Xpert Xpress SARS-CoV-2/FLU/RSV  plus assay is intended as an aid in the diagnosis of influenza from Nasopharyngeal swab specimens and should not be used as a sole basis for treatment. Nasal washings and aspirates are unacceptable for Xpert Xpress SARS-CoV-2/FLU/RSV testing.  Fact Sheet for Patients: EntrepreneurPulse.com.au  Fact Sheet for Healthcare Providers: IncredibleEmployment.be  This test is not yet approved or cleared by the [place] FDA and has been authorized for detection and/or diagnosis of SARS-CoV-2 by FDA under an Emergency Use Authorization (EUA). This EUA will remain in effect (meaning this test can be used) for the duration of the COVID-19 declaration under Section 564(b)(1) of  the Act, 21 U.S.C. section 360bbb-3(b)(1), unless the authorization is terminated or revoked.  Performed at [facility], [place] [address], [place], [place] [address]   MRSA PCR Screening     Status: None   Collection Time: 04/10/20  8:47 AM   Specimen: Nasal Mucosa; Nasopharyngeal  Result Value Ref Range Status   MRSA by PCR NEGATIVE NEGATIVE Final    Comment:        The GeneXpert MRSA Assay (FDA approved for NASAL specimens only), is one component of a comprehensive MRSA colonization surveillance program. It is not intended to diagnose MRSA infection nor to guide or monitor treatment for MRSA infections. Performed at [facility], [place] [address], [place], [place] [address]      Labs: BNP (last 3 results) No results for input(s): BNP in the last 8760 hours. Basic Metabolic Panel: Recent Labs  Lab 04/10/20 2029 04/10/20 2248 04/11/20 0229 04/11/20 0721 04/12/20 0232  NA 141 141 144 147*  143 138  K 3.4* 3.6 3.4* 3.7  3.6 3.6  CL 114* 113* 114* 117*  115* 110  CO2 18* 18* 18* 16*  18* 18*  GLUCOSE 181* 183* 195* 162*  161* 174*  BUN 41* 40* 39* 38*  38* 31*  CREATININE 5.83* 6.07* 5.77* 6.31*  5.93* 5.54*  CALCIUM  8.9 8.9 9.1 9.1  8.9 7.7*  MG  --   --   --   --  1.7   Liver Function Tests: Recent Labs  Lab 04/10/20 0032  AST 31  ALT 21  ALKPHOS 88  BILITOT 0.7  PROT 8.2*  ALBUMIN 3.5   Recent Labs  Lab 04/10/20 0032  LIPASE 33   No results for input(s): AMMONIA in the last 168 hours. CBC: Recent Labs  Lab 04/10/20 0032 04/10/20 0800  WBC 11.2* 9.9  HGB 12.2 10.7*  HCT 38.2 33.3*  MCV 79.7* 79.7*  PLT 559* 476*   Cardiac Enzymes: No results for input(s): CKTOTAL, CKMB, CKMBINDEX, TROPONINI in the last 168 hours. BNP: Invalid input(s): POCBNP CBG: Recent Labs  Lab 04/12/20 0650 04/12/20 0748 04/12/20 1033 04/12/20 1131 04/12/20 1154  GLUCAP 130* 148* 187* 173* 170*   D-Dimer No results for input(s): DDIMER in the last 72 hours. Hgb A1c No results for input(s): HGBA1C in the last 72 hours. Lipid Profile Recent Labs    04/11/20 0721  TRIG 149   Thyroid function studies No results for input(s): TSH, T4TOTAL, T3FREE, THYROIDAB in the last 72 hours.  Invalid input(s): FREET3 Anemia work up No results for input(s): VITAMINB12, FOLATE, FERRITIN, TIBC, IRON, RETICCTPCT in the last 72 hours. Urinalysis    Component Value Date/Time   COLORURINE STRAW (A) 03/06/2020 1726   APPEARANCEUR CLEAR 03/06/2020 1726   LABSPEC 1.008 03/06/2020 1726   PHURINE 7.0 03/06/2020 1726   GLUCOSEU >=500 (A) 03/06/2020 1726   HGBUR NEGATIVE 03/06/2020 1726   BILIRUBINUR NEGATIVE 03/06/2020 1726   KETONESUR NEGATIVE 03/06/2020 1726   PROTEINUR >=300 (A) 03/06/2020 1726   UROBILINOGEN 0.2 08/14/2019 1051   NITRITE NEGATIVE 03/06/2020 1726   LEUKOCYTESUR NEGATIVE 03/06/2020 1726   Sepsis Labs Invalid input(s): PROCALCITONIN,  WBC,  LACTICIDVEN Microbiology Recent Results (from the past 240 hour(s))  Resp Panel by RT-PCR (Flu A&B, Covid) Nasopharyngeal Swab     Status: None   Collection Time: 04/10/20  4:22 AM   Specimen: Nasopharyngeal Swab; Nasopharyngeal(NP) swabs in vial  transport medium  Result Value Ref Range Status   SARS Coronavirus  2 by RT PCR NEGATIVE NEGATIVE Final    Comment: (NOTE) SARS-CoV-2 target nucleic acids are NOT DETECTED.  The SARS-CoV-2 RNA is generally detectable in upper respiratory specimens during the acute phase of infection. The lowest concentration of SARS-CoV-2 viral copies this assay can detect is 138 copies/mL. A negative result does not preclude SARS-Cov-2 infection and should not be used as the sole basis for treatment or other patient management decisions. A negative result may occur with  improper specimen collection/handling, submission of specimen other than nasopharyngeal swab, presence of viral mutation(s) within the areas targeted by this assay, and inadequate number of viral copies(<138 copies/mL). A negative result must be combined with clinical observations, patient history, and epidemiological information. The expected result is Negative.  Fact Sheet for Patients:  EntrepreneurPulse.com.au  Fact Sheet for Healthcare Providers:  IncredibleEmployment.be  This test is no t yet approved or cleared by the [place] FDA and  has been authorized for detection and/or diagnosis of SARS-CoV-2 by FDA under an Emergency Use Authorization (EUA). This EUA will remain  in effect (meaning this test can be used) for the duration of the COVID-19 declaration under Section 564(b)(1) of the Act, 21 U.S.C.section 360bbb-3(b)(1), unless the authorization is terminated  or revoked sooner.       Influenza A by PCR NEGATIVE NEGATIVE Final   Influenza B by PCR NEGATIVE NEGATIVE Final    Comment: (NOTE) The Xpert Xpress SARS-CoV-2/FLU/RSV plus assay is intended as an aid in the diagnosis of influenza from Nasopharyngeal swab specimens and should not be used as a sole basis for treatment. Nasal washings and aspirates are unacceptable for Xpert Xpress SARS-CoV-2/FLU/RSV testing.  Fact  Sheet for Patients: EntrepreneurPulse.com.au  Fact Sheet for Healthcare Providers: IncredibleEmployment.be  This test is not yet approved or cleared by the [place] FDA and has been authorized for detection and/or diagnosis of SARS-CoV-2 by FDA under an Emergency Use Authorization (EUA). This EUA will remain in effect (meaning this test can be used) for the duration of the COVID-19 declaration under Section 564(b)(1) of the Act, 21 U.S.C. section 360bbb-3(b)(1), unless the authorization is terminated or revoked.  Performed at [facility], [place] [address] [place], [place] [address]   MRSA PCR Screening     Status: None   Collection Time: 04/10/20  8:47 AM   Specimen: Nasal Mucosa; Nasopharyngeal  Result Value Ref Range Status   MRSA by PCR NEGATIVE NEGATIVE Final    Comment:        The GeneXpert MRSA Assay (FDA approved for NASAL specimens only), is one component of a comprehensive MRSA colonization surveillance program. It is not intended to diagnose MRSA infection nor to guide or monitor treatment for MRSA infections. Performed at [facility], [place] [address] [place],  [address]      Time coordinating discharge:  39 minutes  SIGNED:   Georgette Shell, MD  [facility] 04/12/2020, 12:35 PM

## 2020-04-18 ENCOUNTER — Other Ambulatory Visit: Payer: Self-pay

## 2020-04-18 ENCOUNTER — Ambulatory Visit (HOSPITAL_COMMUNITY)
Admission: RE | Admit: 2020-04-18 | Discharge: 2020-04-18 | Disposition: A | Discharge status: Home / Self Care | Payer: 59 | Source: Ambulatory Visit | Attending: Endocrinology | Admitting: Endocrinology

## 2020-04-18 DIAGNOSIS — R112 Nausea with vomiting, unspecified: Secondary | ICD-10-CM | POA: Insufficient documentation

## 2020-04-18 DIAGNOSIS — E1010 Type 1 diabetes mellitus with ketoacidosis without coma: Secondary | ICD-10-CM | POA: Diagnosis not present

## 2020-04-18 DIAGNOSIS — E1110 Type 2 diabetes mellitus with ketoacidosis without coma: Secondary | ICD-10-CM | POA: Diagnosis not present

## 2020-04-18 IMAGING — NM NM GASTRIC EMPTYING
8 series · 8 of 8 positions shown · non-contrast
Comparison: None.

CLINICAL DATA: Nausea and vomiting

EXAM:
NUCLEAR MEDICINE GASTRIC EMPTYING SCAN
TECHNIQUE: After oral ingestion of radiolabeled meal, sequential abdominal
images were obtained for 4 hours. Percentage of activity emptying
the stomach was calculated at 1 hour, 2 hour, 3 hour, and 4 hours.
RADIOPHARMACEUTICALS:  1.9 mCi [3I] sulfur colloid in standardized
meal

[Series 1: 0 min · 4.14mm/px · 1 of 1 slices shown (1 of 2)]
[im 1/1]
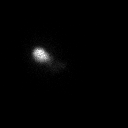

[Series 1: 3 hr · 4.14mm/px · 1 of 1 slices shown (1 of 2)]
[im 1/1  full-range]
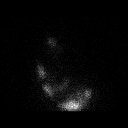

[Series 1: 0 min · 4.14mm/px · 1 of 1 slices shown (2 of 2)]
[im 1/1]
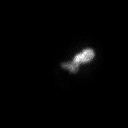

[Series 1: 3 hr · 4.14mm/px · 1 of 1 slices shown (2 of 2)]
[im 1/1]
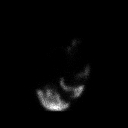

[Series 2: 1 hr · 4.14mm/px · 1 of 1 slices shown (1 of 2)]
[im 1/1]
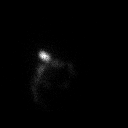

[Series 2: 1 hr · 4.14mm/px · 1 of 1 slices shown (2 of 2)]
[im 1/1]
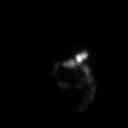

[Series 3: 2 hr · 4.14mm/px · 1 of 1 slices shown (1 of 2)]
[im 1/1  full-range]
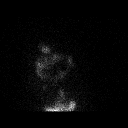

[Series 3: 2 hr · 4.14mm/px · 1 of 1 slices shown (2 of 2)]
[im 1/1]
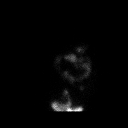

[8 of 8 positions shown; findings below may reference images not displayed]

FINDINGS: Expected location of the stomach in the left upper quadrant.
Ingested meal empties the stomach gradually over the course of the
study.

22% emptied at 1 hr ( normal >= 10%)

82% emptied at 2 hr ( normal >= 40%)

92% emptied at 3 hr ( normal >= 70%)

100% emptied at 4 hr ( normal >= 90%)
IMPRESSION: Normal gastric emptying study.

## 2020-04-18 MED ORDER — TECHNETIUM TC 99M SULFUR COLLOID
1.9000 | Freq: Once | INTRAVENOUS | Status: AC | PRN
  Administered 2020-04-18: 1.9 via ORAL

## 2020-04-19 ENCOUNTER — Other Ambulatory Visit: Payer: Self-pay

## 2020-04-19 ENCOUNTER — Inpatient Hospital Stay (HOSPITAL_COMMUNITY)
Admission: EM | Admit: 2020-04-19 | Discharge: 2020-04-22 | DRG: 638 | Disposition: A | Discharge status: Home / Self Care | Payer: 59 | Attending: Internal Medicine | Admitting: Internal Medicine

## 2020-04-19 ENCOUNTER — Encounter (HOSPITAL_COMMUNITY): Payer: Self-pay | Admitting: Emergency Medicine

## 2020-04-19 DIAGNOSIS — E1043 Type 1 diabetes mellitus with diabetic autonomic (poly)neuropathy: Secondary | ICD-10-CM | POA: Diagnosis present

## 2020-04-19 DIAGNOSIS — I120 Hypertensive chronic kidney disease with stage 5 chronic kidney disease or end stage renal disease: Secondary | ICD-10-CM | POA: Diagnosis present

## 2020-04-19 DIAGNOSIS — I248 Other forms of acute ischemic heart disease: Secondary | ICD-10-CM | POA: Diagnosis present

## 2020-04-19 DIAGNOSIS — F32A Depression, unspecified: Secondary | ICD-10-CM | POA: Diagnosis not present

## 2020-04-19 DIAGNOSIS — E1010 Type 1 diabetes mellitus with ketoacidosis without coma: Principal | ICD-10-CM | POA: Diagnosis present

## 2020-04-19 DIAGNOSIS — K3184 Gastroparesis: Secondary | ICD-10-CM | POA: Diagnosis present

## 2020-04-19 DIAGNOSIS — E1143 Type 2 diabetes mellitus with diabetic autonomic (poly)neuropathy: Secondary | ICD-10-CM | POA: Diagnosis not present

## 2020-04-19 DIAGNOSIS — E1110 Type 2 diabetes mellitus with ketoacidosis without coma: Secondary | ICD-10-CM | POA: Diagnosis present

## 2020-04-19 DIAGNOSIS — Z6835 Body mass index (BMI) 35.0-35.9, adult: Secondary | ICD-10-CM

## 2020-04-19 DIAGNOSIS — Z794 Long term (current) use of insulin: Secondary | ICD-10-CM

## 2020-04-19 DIAGNOSIS — Z8249 Family history of ischemic heart disease and other diseases of the circulatory system: Secondary | ICD-10-CM | POA: Diagnosis not present

## 2020-04-19 DIAGNOSIS — N185 Chronic kidney disease, stage 5: Secondary | ICD-10-CM | POA: Diagnosis not present

## 2020-04-19 DIAGNOSIS — E1022 Type 1 diabetes mellitus with diabetic chronic kidney disease: Secondary | ICD-10-CM | POA: Diagnosis not present

## 2020-04-19 DIAGNOSIS — Z823 Family history of stroke: Secondary | ICD-10-CM

## 2020-04-19 DIAGNOSIS — Z20822 Contact with and (suspected) exposure to covid-19: Secondary | ICD-10-CM | POA: Diagnosis present

## 2020-04-19 DIAGNOSIS — D631 Anemia in chronic kidney disease: Secondary | ICD-10-CM | POA: Diagnosis not present

## 2020-04-19 DIAGNOSIS — Z825 Family history of asthma and other chronic lower respiratory diseases: Secondary | ICD-10-CM | POA: Diagnosis not present

## 2020-04-19 DIAGNOSIS — Z79899 Other long term (current) drug therapy: Secondary | ICD-10-CM | POA: Diagnosis not present

## 2020-04-19 DIAGNOSIS — I160 Hypertensive urgency: Secondary | ICD-10-CM | POA: Diagnosis not present

## 2020-04-19 DIAGNOSIS — E1065 Type 1 diabetes mellitus with hyperglycemia: Secondary | ICD-10-CM

## 2020-04-19 DIAGNOSIS — E876 Hypokalemia: Secondary | ICD-10-CM | POA: Diagnosis present

## 2020-04-19 DIAGNOSIS — R112 Nausea with vomiting, unspecified: Secondary | ICD-10-CM | POA: Diagnosis present

## 2020-04-19 DIAGNOSIS — Z833 Family history of diabetes mellitus: Secondary | ICD-10-CM | POA: Diagnosis not present

## 2020-04-19 DIAGNOSIS — IMO0002 Reserved for concepts with insufficient information to code with codable children: Secondary | ICD-10-CM

## 2020-04-19 LAB — COMPREHENSIVE METABOLIC PANEL
ALT: 15 U/L (ref 0–44)
AST: 16 U/L (ref 15–41)
Albumin: 2.8 g/dL — ABNORMAL LOW (ref 3.5–5.0)
Alkaline Phosphatase: 81 U/L (ref 38–126)
Anion gap: 19 — ABNORMAL HIGH (ref 5–15)
BUN: 42 mg/dL — ABNORMAL HIGH (ref 6–20)
CO2: 14 mmol/L — ABNORMAL LOW (ref 22–32)
Calcium: 9.2 mg/dL (ref 8.9–10.3)
Chloride: 103 mmol/L (ref 98–111)
Creatinine, Ser: 5.66 mg/dL — ABNORMAL HIGH (ref 0.44–1.00)
GFR, Estimated: 10 mL/min — ABNORMAL LOW (ref >60)
Glucose, Bld: 302 mg/dL — ABNORMAL HIGH (ref 70–99)
Potassium: 3.6 mmol/L (ref 3.5–5.1)
Sodium: 136 mmol/L (ref 135–145)
Total Bilirubin: 1 mg/dL (ref 0.3–1.2)
Total Protein: 7.7 g/dL (ref 6.5–8.1)

## 2020-04-19 LAB — LIPASE, BLOOD: Lipase: 33 U/L (ref 11–51)

## 2020-04-19 LAB — CBC
HCT: 35.4 % — ABNORMAL LOW (ref 36.0–46.0)
Hemoglobin: 11.7 g/dL — ABNORMAL LOW (ref 12.0–15.0)
MCH: 25.8 pg — ABNORMAL LOW (ref 26.0–34.0)
MCHC: 33.1 g/dL (ref 30.0–36.0)
MCV: 78 fL — ABNORMAL LOW (ref 80.0–100.0)
Platelets: 506 10*3/uL — ABNORMAL HIGH (ref 150–400)
RBC: 4.54 MIL/uL (ref 3.87–5.11)
RDW: 14.2 % (ref 11.5–15.5)
WBC: 9.6 10*3/uL (ref 4.0–10.5)
nRBC: 0 % (ref 0.0–0.2)

## 2020-04-19 LAB — BASIC METABOLIC PANEL
Anion gap: 20 — ABNORMAL HIGH (ref 5–15)
Anion gap: 18 — ABNORMAL HIGH (ref 5–15)
BUN: 42 mg/dL — ABNORMAL HIGH (ref 6–20)
BUN: 42 mg/dL — ABNORMAL HIGH (ref 6–20)
CO2: 13 mmol/L — ABNORMAL LOW (ref 22–32)
CO2: 14 mmol/L — ABNORMAL LOW (ref 22–32)
Calcium: 8.8 mg/dL — ABNORMAL LOW (ref 8.9–10.3)
Calcium: 8.9 mg/dL (ref 8.9–10.3)
Chloride: 108 mmol/L (ref 98–111)
Chloride: 111 mmol/L (ref 98–111)
Creatinine, Ser: 5.42 mg/dL — ABNORMAL HIGH (ref 0.44–1.00)
Creatinine, Ser: 5.47 mg/dL — ABNORMAL HIGH (ref 0.44–1.00)
GFR, Estimated: 10 mL/min — ABNORMAL LOW (ref >60)
GFR, Estimated: 10 mL/min — ABNORMAL LOW (ref >60)
Glucose, Bld: 301 mg/dL — ABNORMAL HIGH (ref 70–99)
Glucose, Bld: 157 mg/dL — ABNORMAL HIGH (ref 70–99)
Potassium: 4 mmol/L (ref 3.5–5.1)
Potassium: 4.1 mmol/L (ref 3.5–5.1)
Sodium: 141 mmol/L (ref 135–145)
Sodium: 143 mmol/L (ref 135–145)

## 2020-04-19 LAB — CBG MONITORING, ED
Glucose-Capillary: 260 mg/dL — ABNORMAL HIGH (ref 70–99)
Glucose-Capillary: 240 mg/dL — ABNORMAL HIGH (ref 70–99)

## 2020-04-19 LAB — BETA-HYDROXYBUTYRIC ACID
Beta-Hydroxybutyric Acid: 4.8 mmol/L — ABNORMAL HIGH (ref 0.05–0.27)
Beta-Hydroxybutyric Acid: 3.33 mmol/L — ABNORMAL HIGH (ref 0.05–0.27)

## 2020-04-19 LAB — BLOOD GAS, ARTERIAL
Acid-base deficit: 8.8 mmol/L — ABNORMAL HIGH (ref 0.0–2.0)
Bicarbonate: 12.5 mmol/L — ABNORMAL LOW (ref 20.0–28.0)
FIO2: 21
O2 Saturation: 98.2 %
Patient temperature: 98.6
pCO2 arterial: <19 mmHg — CL (ref 32.0–48.0)
pH, Arterial: 7.493 — ABNORMAL HIGH (ref 7.350–7.450)
pO2, Arterial: 148 mmHg — ABNORMAL HIGH (ref 83.0–108.0)

## 2020-04-19 LAB — BLOOD GAS, VENOUS
Acid-base deficit: 6 mmol/L — ABNORMAL HIGH (ref 0.0–2.0)
Bicarbonate: 15.2 mmol/L — ABNORMAL LOW (ref 20.0–28.0)
O2 Saturation: 77 %
Patient temperature: 98.6
pCO2, Ven: 19.4 mmHg — CL (ref 44.0–60.0)
pH, Ven: 7.504 — ABNORMAL HIGH (ref 7.250–7.430)
pO2, Ven: 38.7 mmHg (ref 32.0–45.0)

## 2020-04-19 LAB — GLUCOSE, CAPILLARY
Glucose-Capillary: 274 mg/dL — ABNORMAL HIGH (ref 70–99)
Glucose-Capillary: 168 mg/dL — ABNORMAL HIGH (ref 70–99)
Glucose-Capillary: 158 mg/dL — ABNORMAL HIGH (ref 70–99)
Glucose-Capillary: 181 mg/dL — ABNORMAL HIGH (ref 70–99)
Glucose-Capillary: 190 mg/dL — ABNORMAL HIGH (ref 70–99)
Glucose-Capillary: 226 mg/dL — ABNORMAL HIGH (ref 70–99)

## 2020-04-19 LAB — I-STAT BETA HCG BLOOD, ED (MC, WL, AP ONLY): I-stat hCG, quantitative: <5 m[IU]/mL (ref <5)

## 2020-04-19 LAB — TROPONIN I (HIGH SENSITIVITY)
Troponin I (High Sensitivity): 30 ng/L — ABNORMAL HIGH (ref <18)
Troponin I (High Sensitivity): 33 ng/L — ABNORMAL HIGH (ref <18)

## 2020-04-19 LAB — MRSA PCR SCREENING: MRSA by PCR: NEGATIVE

## 2020-04-19 MED ORDER — HEPARIN SODIUM (PORCINE) 5000 UNIT/ML IJ SOLN
5000.0000 [IU] | Freq: Three times a day (TID) | INTRAMUSCULAR | Status: DC
  Administered 2020-04-19 – 2020-04-22 (×8): 5000 [IU] via SUBCUTANEOUS
  Filled 2020-04-19 (×8): qty 1

## 2020-04-19 MED ORDER — INSULIN REGULAR(HUMAN) IN NACL 100-0.9 UT/100ML-% IV SOLN
INTRAVENOUS | Status: DC
  Administered 2020-04-19: 5.5 [IU]/h via INTRAVENOUS
  Filled 2020-04-19 (×2): qty 100

## 2020-04-19 MED ORDER — METOCLOPRAMIDE HCL 5 MG/ML IJ SOLN
5.0000 mg | Freq: Three times a day (TID) | INTRAMUSCULAR | Status: DC
  Administered 2020-04-19 – 2020-04-20 (×3): 5 mg via INTRAVENOUS
  Filled 2020-04-19 (×3): qty 2

## 2020-04-19 MED ORDER — SODIUM CHLORIDE 0.9 % IV BOLUS
1000.0000 mL | INTRAVENOUS | Status: AC
  Administered 2020-04-19: 1000 mL via INTRAVENOUS

## 2020-04-19 MED ORDER — PROCHLORPERAZINE EDISYLATE 10 MG/2ML IJ SOLN
10.0000 mg | Freq: Four times a day (QID) | INTRAMUSCULAR | Status: DC | PRN
  Administered 2020-04-20 (×2): 10 mg via INTRAVENOUS
  Filled 2020-04-19 (×3): qty 2

## 2020-04-19 MED ORDER — NICARDIPINE HCL IN NACL 20-0.86 MG/200ML-% IV SOLN
3.0000 mg/h | INTRAVENOUS | Status: DC
  Administered 2020-04-19: 5 mg/h via INTRAVENOUS
  Administered 2020-04-20: 3 mg/h via INTRAVENOUS
  Administered 2020-04-20: 5 mg/h via INTRAVENOUS
  Administered 2020-04-20: 5.5 mg/h via INTRAVENOUS
  Filled 2020-04-19 (×7): qty 200

## 2020-04-19 MED ORDER — CLONIDINE HCL 0.2 MG/24HR TD PTWK
0.2000 mg | MEDICATED_PATCH | TRANSDERMAL | Status: DC
  Administered 2020-04-19: 0.2 mg via TRANSDERMAL
  Filled 2020-04-19: qty 1

## 2020-04-19 MED ORDER — ONDANSETRON HCL 4 MG/2ML IJ SOLN
4.0000 mg | Freq: Once | INTRAMUSCULAR | Status: AC
  Administered 2020-04-19: 4 mg via INTRAVENOUS
  Filled 2020-04-19: qty 2

## 2020-04-19 MED ORDER — SODIUM CHLORIDE 0.9 % IV BOLUS
1000.0000 mL | Freq: Once | INTRAVENOUS | Status: AC
  Administered 2020-04-19: 1000 mL via INTRAVENOUS

## 2020-04-19 MED ORDER — HYDRALAZINE HCL 20 MG/ML IJ SOLN
10.0000 mg | Freq: Once | INTRAMUSCULAR | Status: AC
  Administered 2020-04-19: 10 mg via INTRAVENOUS
  Filled 2020-04-19: qty 1

## 2020-04-19 MED ORDER — LABETALOL HCL 5 MG/ML IV SOLN
20.0000 mg | Freq: Once | INTRAVENOUS | Status: AC
  Administered 2020-04-19: 20 mg via INTRAVENOUS
  Filled 2020-04-19: qty 4

## 2020-04-19 MED ORDER — POTASSIUM CHLORIDE 10 MEQ/100ML IV SOLN
10.0000 meq | INTRAVENOUS | Status: AC
  Administered 2020-04-19 (×2): 10 meq via INTRAVENOUS
  Filled 2020-04-19 (×3): qty 100

## 2020-04-19 MED ORDER — SODIUM CHLORIDE 0.9 % IV SOLN: INTRAVENOUS | Status: DC

## 2020-04-19 MED ORDER — DEXTROSE-NACL 5-0.45 % IV SOLN: INTRAVENOUS | Status: DC

## 2020-04-19 MED ORDER — DEXTROSE 50 % IV SOLN: 0.0000 mL | INTRAVENOUS | Status: DC | PRN

## 2020-04-19 NOTE — ED Triage Notes (Signed)
Patient reports N/V since yesterday. Reports last took insulin yesterday but not monitoring CBG at home. Hx type 1 diabetes.

## 2020-04-19 NOTE — ED Notes (Signed)
Put pt on bedpan but pt was unable to produce a specimen

## 2020-04-19 NOTE — ED Notes (Signed)
Pt advised that urine is needed

## 2020-04-19 NOTE — ED Notes (Signed)
MD Trifan advised of PCO2 critical

## 2020-04-19 NOTE — ED Notes (Signed)
Spoke with Lonn Georgia RN in ICU - no need to give report - reviewed a few meds and advised that the pt will be to the floor soon

## 2020-04-19 NOTE — H&P (Signed)
History and Physical    Barbara Donovan [id number] DOB: 11/13/1990 DOA: 04/19/2020  PCP: Fanny Bien, MD  Patient coming from: Home  Chief Complaint: N/V  HPI: Barbara Donovan is a 30 y.o. female with medical history significant of CKD5, IDDM, gastroparesis, HTN. Presenting with N/V. She was recently discharge from the hospital after a stay for DKA. She was doing well until yesterday. She ate some pineapple and threw it up shortly after. From that point on, she has had intractable N/V. She has tried her reglan and phenergan, but it has not helped. She is only able to keep down ice chips. She denies hematemesis, fevers, and diarrhea. This morning when her symptoms didn't improve, she decided to come back to the ED. She denies any other aggravating or alleviating factors.   ED Course: She was found to have elevated BP. She was given labetalol and hydralazine. She was found to be in DKA. She was started on an insulin gtt. TRH was called for admission.   Review of Systems:  Denies CP, dyspnea, palpitations, lightheadedness, dizziness, fevers, diarrhea, HA, ab pain, sick contacts, incontinence, change in diet. Review of systems is otherwise negative for all not mentioned in HPI.   PMHx Past Medical History:  Diagnosis Date  . Asthma    as a child, no problems as an adult, no inhaler  . Cataract    NS OU  . Chronic hypertension during pregnancy, antepartum 08/19/2017  . Dehydration 01/28/2018  . Depression during pregnancy, antepartum 07/07/2017   6/20: Short trial of zoloft previously, reports didn't help much but also didn't give it a chance Discussed r/b/a SSRIs in pregnancy, agrees to try Zoloft again, rx sent No SI/HI/red flags  . Diabetes ([place])    TYPE I  . 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  Marland Kitchen HTN (hypertension)   . Hypertensive retinopathy    OU  . Hypokalemia 01/22/2018  .  Hypomagnesemia 01/28/2018  . Intractable nausea and vomiting 01/22/2018  . Intrauterine growth restriction (IUGR) affecting care of mother 12/22/2017  . Morbid obesity ([place])   . 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 Proteinuria due to type 1 diabetes mellitus ([place]) 11/02/2017   Baseline Pr: Cr 10.23  . Severe hyperemesis gravidarum 10/30/2017  . Type I diabetes mellitus ([place]) 07/07/2017   Current Diabetic Medications:  Insulin  '[x]'$  Aspirin 81 mg daily after 12 weeks (? A2/B GDM)  Required Referrals for A1GDM or A2GDM: '[x]'$  Diabetes Education and Testing Supplies '[x]'$  Nutrition Cousult  For A2/B GDM or higher classes of DM '[x]'$  Diabetes Education and Testing Supplies '[x]'$  Nutrition Counsult '[x]'$  Fetal ECHO after 20 weeks  '[x]'$  Eye exam for retina evaluation - severe retinopathy 7/19  Base  . Ventricular septal defect (VSD) of fetus in singleton pregnancy, antepartum 09/30/2017   May go to newborn nursery per Dr. Lenard Simmer Echo prior to discharge    PSHx Past Surgical History:  Procedure Laterality Date  . [place] VITRECTOMY WITH 20 GAUGE MVR PORT FOR MACULAR HOLE Left 07/20/2018   Procedure: 25 GAUGE PARS PLANA VITRECTOMY LEFT EYE;  Surgeon: Bernarda Caffey, MD;  Location: [place];  Service: Ophthalmology;  Laterality: Left;  . CESAREAN SECTION N/A 12/26/2017   Procedure: CESAREAN SECTION;  Surgeon: Osborne Oman, MD;  Location: [place];  Service: Obstetrics;  Laterality: N/A;  . EYE EXAMINATION UNDER ANESTHESIA Right 07/20/2018   Procedure: Eye Exam Under Anesthesia  RIGHT EYE;  Surgeon: Bernarda Caffey, MD;  Location: [place];  Service: Ophthalmology;  Laterality: Right;  . EYE SURGERY Left 07/2018  . GAS INSERTION Left 07/19/2019   Procedure: INSERTION OF GAS;  Surgeon: Bernarda Caffey, MD;  Location: [employer];  Service: Ophthalmology;  Laterality: Left;  SF6  . INJECTION OF SILICONE OIL Left 99991111   Procedure: Injection Of Silicone Oil LEFT EYE;  Surgeon: Bernarda Caffey, MD;  Location: [place];  Service: Ophthalmology;  Laterality: Left;  . LASER PHOTO ABLATION Right 07/20/2018   Procedure: Laser Photo Ablation RIGHT EYE;  Surgeon: Bernarda Caffey, MD;  Location: [place];  Service: Ophthalmology;  Laterality: Right;  . MEMBRANE PEEL Left 07/20/2018   Procedure: Membrane Peel LEFT EYE;  Surgeon: Bernarda Caffey, MD;  Location: [place];  Service: Ophthalmology;  Laterality: Left;  . MEMBRANE PEEL Left 07/19/2019   Procedure: MEMBRANE PEEL;  Surgeon: Bernarda Caffey, MD;  Location: [place];  Service: Ophthalmology;  Laterality: Left;  . MITOMYCIN C APPLICATION Bilateral 99991111   Procedure: Avastin Application;  Surgeon: Bernarda Caffey, MD;  Location: [place];  Service: Ophthalmology;  Laterality: Bilateral;  . PHOTOCOAGULATION WITH LASER Left 07/20/2018   Procedure: Photocoagulation With Laser LEFT EYE;  Surgeon: Bernarda Caffey, MD;  Location: [place];  Service: Ophthalmology;  Laterality: Left;  . PHOTOCOAGULATION WITH LASER Left 07/19/2019   Procedure: PHOTOCOAGULATION WITH LASER;  Surgeon: Bernarda Caffey, MD;  Location: [place];  Service: Ophthalmology;  Laterality: Left;  . RETINAL DETACHMENT SURGERY Left 07/20/2018   Dr. Bernarda Caffey  . SILICON OIL REMOVAL Left 07/19/2019   Procedure: 25g PARS PLANA VITRECTOMY WITH SILICON OIL REMOVAL;  Surgeon: Bernarda Caffey, MD;  Location: [place];  Service: Ophthalmology;  Laterality: Left;  . WISDOM TOOTH EXTRACTION      SocHx  reports that she has never smoked. She has never used smokeless tobacco. She reports previous alcohol use. She reports that she does not use drugs.  No Known Allergies  FamHx Family History  Problem Relation Age of Onset  . Diabetes Mother   . Aneurysm Mother 85  . Seizures Mother   . Diabetes Father   . Cataracts Father   . COPD Father   . Heart attack Father   . Heart disease Father   . Healthy Sister   . Healthy Daughter   . Stroke Maternal Grandfather   . Amblyopia Neg Hx   . Blindness Neg Hx   . Glaucoma Neg Hx   . Macular  degeneration Neg Hx   . Retinal detachment Neg Hx   . Strabismus Neg Hx   . Retinitis pigmentosa Neg Hx   . Colon cancer Neg Hx   . Stomach cancer Neg Hx   . Esophageal cancer Neg Hx   . Pancreatic cancer Neg Hx   . Liver disease Neg Hx     Prior to Admission medications   Medication Sig Start Date End Date Taking? Authorizing Provider  amLODipine (NORVASC) 10 MG tablet Take 1 tablet (10 mg total) by mouth daily. 03/09/20   Mercy Riding, MD  carvedilol (COREG) 25 MG tablet Take 1 tablet (25 mg total) by mouth 2 (two) times daily. 03/07/19   Imogene Burn, PA-C  cloNIDine (CATAPRES) 0.2 MG tablet Take 1 tablet (0.2 mg total) by mouth 2 (two) times daily. 03/08/20   Mercy Riding, MD  Continuous Blood Gluc Sensor MISC 1 each by Does not apply route as directed. Use as directed every 14 days.  May dispense FreeStyle [employer] or similar. 01/27/20   Antonieta Pert, MD  hydrALAZINE (APRESOLINE) 25 MG tablet Take 3 tablets (75 mg total) by mouth 3 (three) times daily. 03/08/20   Mercy Riding, MD  insulin glargine (LANTUS SOLOSTAR) 100 UNIT/ML Solostar Pen Inject 10 Units into the skin daily. 03/08/20   Mercy Riding, MD  insulin lispro (HUMALOG KWIKPEN) 100 UNIT/ML KwikPen 3 times a day (just before each meal) 5-4-4 units 03/21/20   Renato Shin, MD  latanoprost (XALATAN) 0.005 % ophthalmic solution Place 1 drop into the left eye at bedtime. 02/28/20   Bernarda Caffey, MD  metoCLOPramide (REGLAN) 5 MG tablet Take 1 tablet (5 mg total) by mouth every 8 (eight) hours as needed for nausea or vomiting. 02/10/20   Mercy Riding, MD  multivitamin (RENA-VIT) TABS tablet Take 1 tablet by mouth at bedtime. 03/08/20   Mercy Riding, MD  pantoprazole (PROTONIX) 40 MG tablet Take 1 tablet (40 mg total) by mouth daily. 01/31/20   Ladene Artist, MD  scopolamine (TRANSDERM-SCOP) 1 MG/3DAYS Place 1 patch (1.5 mg total) onto the skin every 3 (three) days. 04/13/20   Georgette Shell, MD  sertraline (ZOLOFT) 100  MG tablet Take 100 mg by mouth every morning. 02/15/20   [provider]  sodium bicarbonate 325 MG tablet Take 325 mg by mouth 4 (four) times daily.    [provider]  traZODone (DESYREL) 50 MG tablet Take 50 mg by mouth at bedtime. 02/16/20   [provider]    Physical Exam: Vitals:   04/19/20 1315 04/19/20 1330 04/19/20 1345 04/19/20 1400  BP: (!) 192/111 (!) 199/104 (!) 179/100 (!) 179/103  Pulse: (!) 106 97 (!) 101 (!) 101  Resp: '19 15 18 '$ (!) 22  Temp:      TempSrc:      SpO2: 100% 99% 100% 100%    General: 30 y.o. female resting in bed in NAD Eyes: PERRL, normal sclera ENMT: Nares patent w/o discharge, orophaynx clear, dentition normal, ears w/o discharge/lesions/ulcers Neck: Supple, trachea midline Cardiovascular: tachy, +S1, S2, no m/g/r, equal pulses throughout Respiratory: CTABL, no w/r/r, normal WOB GI: BS+, NDNT, no masses noted, no organomegaly noted MSK: No e/c/c Skin: No rashes, bruises, ulcerations noted Neuro: A&O x 3, no focal deficits Psyc: Appropriate interaction but flat affect, calm/cooperative  Labs on Admission: I have personally reviewed following labs and imaging studies  CBC: Recent Labs  Lab 04/19/20 1240  WBC 9.6  HGB 11.7*  HCT 35.4*  MCV 78.0*  PLT [id number]*   Basic Metabolic Panel: Recent Labs  Lab 04/19/20 1240  NA 136  K 3.6  CL 103  CO2 14*  GLUCOSE 302*  BUN 42*  CREATININE 5.66*  CALCIUM 9.2   GFR: Estimated Creatinine Clearance: 15.1 mL/min (A) (by C-G formula based on SCr of 5.66 mg/dL (H)). Liver Function Tests: Recent Labs  Lab 04/19/20 1240  AST 16  ALT 15  ALKPHOS 81  BILITOT 1.0  PROT 7.7  ALBUMIN 2.8*   Recent Labs  Lab 04/19/20 1240  LIPASE 33   No results for input(s): AMMONIA in the last 168 hours. Coagulation Profile: No results for input(s): INR, PROTIME in the last 168 hours. Cardiac Enzymes: No results for input(s): CKTOTAL, CKMB, CKMBINDEX, TROPONINI in the last 168  hours. BNP (last 3 results) No results for input(s): PROBNP in the last 8760 hours. HbA1C: No results for input(s): HGBA1C in the last 72 hours. CBG: Recent  Labs  Lab 04/12/20 1601 04/19/20 1204 04/19/20 1328  GLUCAP 198* 260* 240*   Lipid Profile: No results for input(s): CHOL, HDL, LDLCALC, TRIG, CHOLHDL, LDLDIRECT in the last 72 hours. Thyroid Function Tests: No results for input(s): TSH, T4TOTAL, FREET4, T3FREE, THYROIDAB in the last 72 hours. Anemia Panel: No results for input(s): VITAMINB12, FOLATE, FERRITIN, TIBC, IRON, RETICCTPCT in the last 72 hours. Urine analysis:    Component Value Date/Time   COLORURINE STRAW (A) 03/06/2020 1726   APPEARANCEUR CLEAR 03/06/2020 1726   LABSPEC 1.008 03/06/2020 1726   PHURINE 7.0 03/06/2020 1726   GLUCOSEU >=500 (A) 03/06/2020 1726   HGBUR NEGATIVE 03/06/2020 1726   BILIRUBINUR NEGATIVE 03/06/2020 1726   KETONESUR NEGATIVE 03/06/2020 1726   PROTEINUR >=300 (A) 03/06/2020 1726   UROBILINOGEN 0.2 08/14/2019 1051   NITRITE NEGATIVE 03/06/2020 1726   LEUKOCYTESUR NEGATIVE 03/06/2020 1726    Radiological Exams on Admission: No results found.  EKG: Independently reviewed. Sinus tach, no st elevations  Assessment/Plan Intractable N/V Hx of diabetic gastroparesis     - admit to inpt, SDU     - IV reglan, compazine     - continue scopolamine patch     - NPO for now     - Had an emptying study yesterday; awaiting results     - she denies constipation or diarrhea  DKA     - Endotool started; monitor glucose, anion gap; transition to SQ Insulin when able; will start CLD at that time if she is tolerating PO  HTN urgency     - place on cardene gtt until N/V under control     - can transition her clonidine to a patch  CKD5     - she is at her baseline SCr     - she is still making urine     - lytes are largely ok; follow  Elevated troponin     - no CP, EKG ok, trp another set  Microcytic anemia     - no evidence of bleed,  follow     - check iron studies  DVT prophylaxis: heparin  Code Status: FULL  Family Communication: None at bedside.   Consults called: None  Status is: Inpatient  Remains inpatient appropriate because:Inpatient level of care appropriate due to severity of illness   Dispo: The patient is from: Home              Anticipated d/c is to: Home              Patient currently is not medically stable to d/c.   Difficult to place patient No  Jonnie Finner DO [facility]  If 7PM-7AM, please contact night-coverage [email or web address]  04/19/2020, 2:16 PM

## 2020-04-19 NOTE — ED Provider Notes (Signed)
[facility] DEPT Provider Note   CSN: [id number] Arrival date & time: 04/19/20  1042     History Chief Complaint  Patient presents with  . Emesis    Barbara Donovan is a 30 y.o. female with PMHx HTN, Type I diabetes,  who presents to the ED today with complaints of nausea and NBNB emesis for the past day. Pt was recently admitted to the hospital for DKA and discharged on 03/26. She had a gastric emptying study done yesterday (unable to see the results) however started feeling bad afterwards. She is unsure if this feels similar to DKA. She did take her insulin yesterday; did not take any today. Per triage report pt is not monitoring her CBG at home. She denies any abdominal pain. No fevers or chills. She hasn't had a BM since 03/24 however is still passing gas. PSHx includes c section.   The history is provided by the patient and medical records.       Past Medical History:  Diagnosis Date  . Asthma    as a child, no problems as an adult, no inhaler  . Cataract    NS OU  . Chronic hypertension during pregnancy, antepartum 08/19/2017  . Dehydration 01/28/2018  . Depression during pregnancy, antepartum 07/07/2017   6/20: Short trial of zoloft previously, reports didn't help much but also didn't give it a chance Discussed r/b/a SSRIs in pregnancy, agrees to try Zoloft again, rx sent No SI/HI/red flags  . Diabetes ([place])    TYPE I  . Diabetic retinopathy ([place]) 06/09/2017   07/2017 with bilateral severe diabetic non-proliferative retinopathy with macular edema.  Marland Kitchen HTN (hypertension)   . Hypertensive retinopathy    OU  . Hypokalemia 01/22/2018  . Hypomagnesemia 01/28/2018  . Intractable nausea and vomiting 01/22/2018  . Intrauterine growth restriction (IUGR) affecting care of mother 12/22/2017  . Morbid obesity ([place])   . 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 Proteinuria due to type 1 diabetes mellitus ([place]) 11/02/2017   Baseline Pr: Cr 10.23  . Severe  hyperemesis gravidarum 10/30/2017  . Type I diabetes mellitus ([place]) 07/07/2017   Current Diabetic Medications:  Insulin  '[x]'$  Aspirin 81 mg daily after 12 weeks (? A2/B GDM)  Required Referrals for A1GDM or A2GDM: '[x]'$  Diabetes Education and Testing Supplies '[x]'$  Nutrition Cousult  For A2/B GDM or higher classes of DM '[x]'$  Diabetes Education and Testing Supplies '[x]'$  Nutrition Counsult '[x]'$  Fetal ECHO after 20 weeks  '[x]'$  Eye exam for retina evaluation - severe retinopathy 7/19  Base  . Ventricular septal defect (VSD) of fetus in singleton pregnancy, antepartum 09/30/2017   May go to newborn nursery per Dr. Lenard Simmer Echo prior to discharge    Patient Active Problem List   Diagnosis Date Noted  . Diabetic gastroparesis ([place]) 03/04/2020  . Anemia due to stage 5 chronic kidney disease ([place]) 03/04/2020  . Diabetic ketoacidosis without coma associated with type 1 diabetes mellitus ([place]) 02/09/2020  . Hypothermia 01/24/2020  . DKA (diabetic ketoacidosis) ([place]) 09/14/2019  . COVID-19 virus infection 09/14/2019  . SOB (shortness of breath) 03/27/2018  . Hypomagnesemia 01/28/2018  . Dehydration 01/28/2018  . Intractable nausea and vomiting 01/22/2018  . Hypertensive urgency 01/22/2018  . Hypokalemia 01/22/2018  . Nephropathy, diabetic ([place]) 12/29/2017  . Proteinuria due to type 1 diabetes mellitus ([place]) 11/02/2017  . Chronic hypertension 08/21/2017  . Uncontrolled type 1 diabetes mellitus with stage 5 chronic kidney disease not on chronic dialysis, with long-term current use of  insulin ([place]) 07/07/2017  . Diabetic retinopathy ([place]) 06/09/2017  . Acute depression 11/02/2014  . Asthma 10/30/2013    Past Surgical History:  Procedure Laterality Date  . [place] VITRECTOMY WITH 20 GAUGE MVR PORT FOR MACULAR HOLE Left 07/20/2018   Procedure: 25 GAUGE PARS PLANA VITRECTOMY LEFT EYE;  Surgeon: Bernarda Caffey, MD;  Location: [place] [facility];  Service: Ophthalmology;  Laterality: Left;  . CESAREAN SECTION N/A  12/26/2017   Procedure: CESAREAN SECTION;  Surgeon: Osborne Oman, MD;  Location: [facility];  Service: Obstetrics;  Laterality: N/A;  . EYE EXAMINATION UNDER ANESTHESIA Right 07/20/2018   Procedure: Eye Exam Under Anesthesia RIGHT EYE;  Surgeon: Bernarda Caffey, MD;  Location: [place];  Service: Ophthalmology;  Laterality: Right;  . EYE SURGERY Left 07/2018  . GAS INSERTION Left 07/19/2019   Procedure: INSERTION OF GAS;  Surgeon: Bernarda Caffey, MD;  Location: [place];  Service: Ophthalmology;  Laterality: Left;  SF6  . INJECTION OF SILICONE OIL Left 99991111   Procedure: Injection Of Silicone Oil LEFT EYE;  Surgeon: Bernarda Caffey, MD;  Location: [place];  Service: Ophthalmology;  Laterality: Left;  . LASER PHOTO ABLATION Right 07/20/2018   Procedure: Laser Photo Ablation RIGHT EYE;  Surgeon: Bernarda Caffey, MD;  Location: [facility];  Service: Ophthalmology;  Laterality: Right;  . MEMBRANE PEEL Left 07/20/2018   Procedure: Membrane Peel LEFT EYE;  Surgeon: Bernarda Caffey, MD;  Location: [place] [facility];  Service: Ophthalmology;  Laterality: Left;  . MEMBRANE PEEL Left 07/19/2019   Procedure: MEMBRANE PEEL;  Surgeon: Bernarda Caffey, MD;  Location: [facility];  Service: Ophthalmology;  Laterality: Left;  . MITOMYCIN C APPLICATION Bilateral 99991111   Procedure: Avastin Application;  Surgeon: Bernarda Caffey, MD;  Location: [place];  Service: Ophthalmology;  Laterality: Bilateral;  . PHOTOCOAGULATION WITH LASER Left 07/20/2018   Procedure: Photocoagulation With Laser LEFT EYE;  Surgeon: Bernarda Caffey, MD;  Location: [facility];  Service: Ophthalmology;  Laterality: Left;  . PHOTOCOAGULATION WITH LASER Left 07/19/2019   Procedure: PHOTOCOAGULATION WITH LASER;  Surgeon: Bernarda Caffey, MD;  Location: [facility];  Service: Ophthalmology;  Laterality: Left;  . RETINAL DETACHMENT SURGERY Left 07/20/2018   Dr. Bernarda Caffey  . SILICON OIL REMOVAL Left 07/19/2019   Procedure: 25g PARS PLANA VITRECTOMY WITH SILICON OIL REMOVAL;  Surgeon: Bernarda Caffey,  MD;  Location: [facility];  Service: Ophthalmology;  Laterality: Left;  . WISDOM TOOTH EXTRACTION       OB History    Gravida  1   Para  1   Term      Preterm  1   AB      Living  1     SAB      IAB      Ectopic      Multiple  0   Live Births  1           Family History  Problem Relation Age of Onset  . Diabetes Mother   . Aneurysm Mother 69  . Seizures Mother   . Diabetes Father   . Cataracts Father   . COPD Father   . Heart attack Father   . Heart disease Father   . Healthy Sister   . Healthy Daughter   . Stroke Maternal Grandfather   . Amblyopia Neg Hx   . Blindness Neg Hx   . Glaucoma Neg Hx   . Macular degeneration Neg Hx   . Retinal detachment Neg Hx   .  Strabismus Neg Hx   . Retinitis pigmentosa Neg Hx   . Colon cancer Neg Hx   . Stomach cancer Neg Hx   . Esophageal cancer Neg Hx   . Pancreatic cancer Neg Hx   . Liver disease Neg Hx     Social History   Tobacco Use  . Smoking status: Never Smoker  . Smokeless tobacco: Never Used  Vaping Use  . Vaping Use: Never used  Substance Use Topics  . Alcohol use: Not Currently    Comment: SOCIALLY  . Drug use: Never    Home Medications Prior to Admission medications   Medication Sig Start Date End Date Taking? Authorizing Provider  amLODipine (NORVASC) 10 MG tablet Take 1 tablet (10 mg total) by mouth daily. 03/09/20   Mercy Riding, MD  carvedilol (COREG) 25 MG tablet Take 1 tablet (25 mg total) by mouth 2 (two) times daily. 03/07/19   Imogene Burn, PA-C  cloNIDine (CATAPRES) 0.2 MG tablet Take 1 tablet (0.2 mg total) by mouth 2 (two) times daily. 03/08/20   Mercy Riding, MD  Continuous Blood Gluc Sensor MISC 1 each by Does not apply route as directed. Use as directed every 14 days. May dispense FreeStyle [employer] or similar. 01/27/20   Antonieta Pert, MD  hydrALAZINE (APRESOLINE) 25 MG tablet Take 3 tablets (75 mg total) by mouth 3 (three) times daily. 03/08/20   Mercy Riding, MD   insulin glargine (LANTUS SOLOSTAR) 100 UNIT/ML Solostar Pen Inject 10 Units into the skin daily. 03/08/20   Mercy Riding, MD  insulin lispro (HUMALOG KWIKPEN) 100 UNIT/ML KwikPen 3 times a day (just before each meal) 5-4-4 units 03/21/20   Renato Shin, MD  latanoprost (XALATAN) 0.005 % ophthalmic solution Place 1 drop into the left eye at bedtime. 02/28/20   Bernarda Caffey, MD  metoCLOPramide (REGLAN) 5 MG tablet Take 1 tablet (5 mg total) by mouth every 8 (eight) hours as needed for nausea or vomiting. 02/10/20   Mercy Riding, MD  multivitamin (RENA-VIT) TABS tablet Take 1 tablet by mouth at bedtime. 03/08/20   Mercy Riding, MD  pantoprazole (PROTONIX) 40 MG tablet Take 1 tablet (40 mg total) by mouth daily. 01/31/20   Ladene Artist, MD  scopolamine (TRANSDERM-SCOP) 1 MG/3DAYS Place 1 patch (1.5 mg total) onto the skin every 3 (three) days. 04/13/20   Georgette Shell, MD  sertraline (ZOLOFT) 100 MG tablet Take 100 mg by mouth every morning. 02/15/20   [provider]  sodium bicarbonate 325 MG tablet Take 325 mg by mouth 4 (four) times daily.    [provider]  traZODone (DESYREL) 50 MG tablet Take 50 mg by mouth at bedtime. 02/16/20   [provider]    Allergies    Patient has no known allergies.  Review of Systems   Review of Systems  Constitutional: Negative for chills and fever.  Gastrointestinal: Positive for nausea and vomiting. Negative for abdominal pain.  All other systems reviewed and are negative.   Physical Exam Updated Vital Signs BP (!) 193/114 (BP Location: Left Arm)   Pulse (!) 116   Temp 98.2 F (36.8 C) (Oral)   Resp (!) 22   SpO2 98%   Physical Exam Vitals and nursing note reviewed.  Constitutional:      Appearance: She is not ill-appearing or diaphoretic.     Comments: Actively vomiting  HENT:     Head: Normocephalic and atraumatic.  Mouth/Throat:     Mouth: Mucous membranes are dry.  Eyes:     Conjunctiva/sclera:  Conjunctivae normal.  Cardiovascular:     Rate and Rhythm: Normal rate and regular rhythm.     Pulses: Normal pulses.  Pulmonary:     Effort: Pulmonary effort is normal.     Breath sounds: Normal breath sounds. No wheezing, rhonchi or rales.  Abdominal:     Palpations: Abdomen is soft.     Tenderness: There is no abdominal tenderness. There is no guarding or rebound.  Musculoskeletal:     Cervical back: Neck supple.  Skin:    General: Skin is warm and dry.  Neurological:     Mental Status: She is alert.     ED Results / Procedures / Treatments   Labs (all labs ordered are listed, but only abnormal results are displayed) Labs Reviewed  COMPREHENSIVE METABOLIC PANEL - Abnormal; Notable for the following components:      Result Value   CO2 14 (*)    Glucose, Bld 302 (*)    BUN 42 (*)    Creatinine, Ser 5.66 (*)    Albumin 2.8 (*)    GFR, Estimated 10 (*)    Anion gap 19 (*)    All other components within normal limits  CBC - Abnormal; Notable for the following components:   Hemoglobin 11.7 (*)    HCT 35.4 (*)    MCV 78.0 (*)    MCH 25.8 (*)    Platelets 506 (*)    All other components within normal limits  BLOOD GAS, VENOUS - Abnormal; Notable for the following components:   pH, Ven 7.504 (*)    pCO2, Ven 19.4 (*)    Bicarbonate 15.2 (*)    Acid-base deficit 6.0 (*)    All other components within normal limits  BETA-HYDROXYBUTYRIC ACID - Abnormal; Notable for the following components:   Beta-Hydroxybutyric Acid 4.80 (*)    All other components within normal limits  CBG MONITORING, ED - Abnormal; Notable for the following components:   Glucose-Capillary 260 (*)    All other components within normal limits  CBG MONITORING, ED - Abnormal; Notable for the following components:   Glucose-Capillary 240 (*)    All other components within normal limits  TROPONIN I (HIGH SENSITIVITY) - Abnormal; Notable for the following components:   Troponin I (High Sensitivity) 30 (*)     All other components within normal limits  SARS CORONAVIRUS 2 (TAT 6-24 HRS)  LIPASE, BLOOD  URINALYSIS, ROUTINE W REFLEX MICROSCOPIC  I-STAT BETA HCG BLOOD, ED ([facility], [facility], [facility] ONLY)    EKG EKG Interpretation  Date/Time:  Saturday April 19 2020 11:59:00 EDT Ventricular Rate:  112 PR Interval:  167 QRS Duration: 96 QT Interval:  350 QTC Calculation: 478 R Axis:   87 Text Interpretation: Sinus tachycardia Borderline prolonged QT interval No STEMI Confirmed by Octaviano Glow [phone]) on 04/19/2020 12:55:40 PM   Radiology No results found.  Procedures Procedures   Medications Ordered in ED Medications  insulin regular, human (MYXREDLIN) 100 units/ 100 mL infusion (has no administration in time range)  dextrose 50 % solution 0-50 mL (has no administration in time range)  potassium chloride 10 mEq in 100 mL IVPB (10 mEq Intravenous New Bag/Given 04/19/20 1350)  0.9 %  sodium chloride infusion (has no administration in time range)  dextrose 5 %-0.45 % sodium chloride infusion (has no administration in time range)  sodium chloride 0.9 % bolus 1,000 mL (0  mLs Intravenous Stopped 04/19/20 1350)  ondansetron (ZOFRAN) injection 4 mg (4 mg Intravenous Given 04/19/20 1300)  labetalol (NORMODYNE) injection 20 mg (20 mg Intravenous Given 04/19/20 1301)  hydrALAZINE (APRESOLINE) injection 10 mg (10 mg Intravenous Given 04/19/20 1338)  sodium chloride 0.9 % bolus 1,000 mL (1,000 mLs Intravenous New Bag/Given 04/19/20 1350)    ED Course  I have reviewed the triage vital signs and the nursing notes.  Pertinent labs & imaging results that were available during my care of the patient were reviewed by me and considered in my medical decision making (see chart for details).  Clinical Course as of 04/19/20 1419  Sat Apr 19, 2020  1206 Glucose-Capillary(!): 260 [MV]    Clinical Course User Index [MV] Eustaquio Maize, [place]   MDM Rules/Calculators/A&P                          30 year old female presents  to the ED today complaining of nausea and vomiting that began yesterday.  States she has been compliant with her diabetic medication however did not have any today.  She was recently admitted to the hospital for DKA.  On arrival to the ED today patient is afebrile.  She is mildly tachycardic with a rate of 22.  Does not seem consistent with [place] respirations.  She is tachycardic to the 110s.  Blood pressure also significantly elevated today 193/114.  Patient has not taken her blood pressure medication today as she has not been able to tolerate p.o.  We will plan to work-up with concern for DKA today.  Will provide labetalol for hypertension in the hopes that it may bring down her tachycardia as well fluids provided.  CBG on arrival 260  CBC without leukocytosis. Hgb stable at 11.7 VBG with pH 7.504 and pCO2 19.4. Bicarb 15.2. Will await CMP at this time. It does appear pt had similar presentation of alkalosis on VBG in Feb when she was still in DKA.   Attending physician Dr. Langston Masker evaluated patient - blood pressure continues to be elevated despite labetolol. Will add on hydralazine at this time. Recommends adding on troponin at this time given hypertensive urgency to assess for end organ damage.   CMP with glucose 302, bicarb 14 with gap of 19.. Creatinine 5.66 which is near her baseline. BUN 42.   Lab Results  Component Value Date   CREATININE 5.66 (H) 04/19/2020   CREATININE 5.54 (H) 04/12/2020   CREATININE 5.93 (H) 04/11/2020   CREATININE 6.31 (H) 04/11/2020   Beta hydroxybutyric acid elevated at 4.80. Will start on endotool at this time. Pt will require admission.  Troponin 30. Will repeat.   Discussed case with [facility] Dr. Marylyn Ishihara who agrees to evaluate patient for admission. Appreciate his involvement.   This note was prepared using Dragon voice recognition software and may include unintentional dictation errors due to the inherent limitations of voice recognition  software.  Final Clinical Impression(s) / ED Diagnoses Final diagnoses:  Diabetic ketoacidosis without coma associated with type 1 diabetes mellitus ([place])  Hypertensive urgency    Rx / DC Orders ED Discharge Orders    None       Eustaquio Maize, PA-C 04/19/20 1420    Wyvonnia Dusky, MD 04/19/20 1721

## 2020-04-20 ENCOUNTER — Inpatient Hospital Stay (HOSPITAL_COMMUNITY): Payer: 59

## 2020-04-20 ENCOUNTER — Encounter (HOSPITAL_COMMUNITY): Payer: Self-pay | Admitting: Internal Medicine

## 2020-04-20 DIAGNOSIS — F32A Depression, unspecified: Secondary | ICD-10-CM | POA: Diagnosis not present

## 2020-04-20 DIAGNOSIS — I160 Hypertensive urgency: Secondary | ICD-10-CM

## 2020-04-20 DIAGNOSIS — E1065 Type 1 diabetes mellitus with hyperglycemia: Secondary | ICD-10-CM

## 2020-04-20 DIAGNOSIS — N185 Chronic kidney disease, stage 5: Secondary | ICD-10-CM | POA: Diagnosis not present

## 2020-04-20 DIAGNOSIS — R112 Nausea with vomiting, unspecified: Secondary | ICD-10-CM

## 2020-04-20 DIAGNOSIS — K3184 Gastroparesis: Secondary | ICD-10-CM

## 2020-04-20 DIAGNOSIS — E1143 Type 2 diabetes mellitus with diabetic autonomic (poly)neuropathy: Secondary | ICD-10-CM

## 2020-04-20 DIAGNOSIS — E876 Hypokalemia: Secondary | ICD-10-CM

## 2020-04-20 DIAGNOSIS — D631 Anemia in chronic kidney disease: Secondary | ICD-10-CM

## 2020-04-20 DIAGNOSIS — E1022 Type 1 diabetes mellitus with diabetic chronic kidney disease: Secondary | ICD-10-CM

## 2020-04-20 DIAGNOSIS — E1010 Type 1 diabetes mellitus with ketoacidosis without coma: Secondary | ICD-10-CM | POA: Diagnosis not present

## 2020-04-20 LAB — CBC
HCT: 32.9 % — ABNORMAL LOW (ref 36.0–46.0)
Hemoglobin: 10.5 g/dL — ABNORMAL LOW (ref 12.0–15.0)
MCH: 25.5 pg — ABNORMAL LOW (ref 26.0–34.0)
MCHC: 31.9 g/dL (ref 30.0–36.0)
MCV: 80 fL (ref 80.0–100.0)
Platelets: 479 10*3/uL — ABNORMAL HIGH (ref 150–400)
RBC: 4.11 MIL/uL (ref 3.87–5.11)
RDW: 14.6 % (ref 11.5–15.5)
WBC: 11.8 10*3/uL — ABNORMAL HIGH (ref 4.0–10.5)
nRBC: 0 % (ref 0.0–0.2)

## 2020-04-20 LAB — BASIC METABOLIC PANEL
Anion gap: 10 (ref 5–15)
Anion gap: 11 (ref 5–15)
Anion gap: 12 (ref 5–15)
Anion gap: 12 (ref 5–15)
Anion gap: 13 (ref 5–15)
BUN: 35 mg/dL — ABNORMAL HIGH (ref 6–20)
BUN: 36 mg/dL — ABNORMAL HIGH (ref 6–20)
BUN: 39 mg/dL — ABNORMAL HIGH (ref 6–20)
BUN: 40 mg/dL — ABNORMAL HIGH (ref 6–20)
BUN: 41 mg/dL — ABNORMAL HIGH (ref 6–20)
CO2: 16 mmol/L — ABNORMAL LOW (ref 22–32)
CO2: 16 mmol/L — ABNORMAL LOW (ref 22–32)
CO2: 16 mmol/L — ABNORMAL LOW (ref 22–32)
CO2: 17 mmol/L — ABNORMAL LOW (ref 22–32)
CO2: 17 mmol/L — ABNORMAL LOW (ref 22–32)
Calcium: 8.2 mg/dL — ABNORMAL LOW (ref 8.9–10.3)
Calcium: 8.2 mg/dL — ABNORMAL LOW (ref 8.9–10.3)
Calcium: 8.4 mg/dL — ABNORMAL LOW (ref 8.9–10.3)
Calcium: 8.5 mg/dL — ABNORMAL LOW (ref 8.9–10.3)
Calcium: 8.6 mg/dL — ABNORMAL LOW (ref 8.9–10.3)
Chloride: 109 mmol/L (ref 98–111)
Chloride: 110 mmol/L (ref 98–111)
Chloride: 111 mmol/L (ref 98–111)
Chloride: 111 mmol/L (ref 98–111)
Chloride: 112 mmol/L — ABNORMAL HIGH (ref 98–111)
Creatinine, Ser: 5.35 mg/dL — ABNORMAL HIGH (ref 0.44–1.00)
Creatinine, Ser: 5.46 mg/dL — ABNORMAL HIGH (ref 0.44–1.00)
Creatinine, Ser: 5.49 mg/dL — ABNORMAL HIGH (ref 0.44–1.00)
Creatinine, Ser: 5.67 mg/dL — ABNORMAL HIGH (ref 0.44–1.00)
Creatinine, Ser: 5.82 mg/dL — ABNORMAL HIGH (ref 0.44–1.00)
GFR, Estimated: 10 mL/min — ABNORMAL LOW (ref >60)
GFR, Estimated: 10 mL/min — ABNORMAL LOW (ref >60)
GFR, Estimated: 10 mL/min — ABNORMAL LOW (ref >60)
GFR, Estimated: 10 mL/min — ABNORMAL LOW (ref >60)
GFR, Estimated: 9 mL/min — ABNORMAL LOW (ref >60)
Glucose, Bld: 150 mg/dL — ABNORMAL HIGH (ref 70–99)
Glucose, Bld: 171 mg/dL — ABNORMAL HIGH (ref 70–99)
Glucose, Bld: 175 mg/dL — ABNORMAL HIGH (ref 70–99)
Glucose, Bld: 178 mg/dL — ABNORMAL HIGH (ref 70–99)
Glucose, Bld: 234 mg/dL — ABNORMAL HIGH (ref 70–99)
Potassium: 3.1 mmol/L — ABNORMAL LOW (ref 3.5–5.1)
Potassium: 3.1 mmol/L — ABNORMAL LOW (ref 3.5–5.1)
Potassium: 3.2 mmol/L — ABNORMAL LOW (ref 3.5–5.1)
Potassium: 3.3 mmol/L — ABNORMAL LOW (ref 3.5–5.1)
Potassium: 3.4 mmol/L — ABNORMAL LOW (ref 3.5–5.1)
Sodium: 138 mmol/L (ref 135–145)
Sodium: 138 mmol/L (ref 135–145)
Sodium: 139 mmol/L (ref 135–145)
Sodium: 139 mmol/L (ref 135–145)
Sodium: 139 mmol/L (ref 135–145)

## 2020-04-20 LAB — IRON AND TIBC
Iron: 84 ug/dL (ref 28–170)
Saturation Ratios: 40 % — ABNORMAL HIGH (ref 10.4–31.8)
TIBC: 209 ug/dL — ABNORMAL LOW (ref 250–450)
UIBC: 125 ug/dL

## 2020-04-20 LAB — GLUCOSE, CAPILLARY
Glucose-Capillary: 134 mg/dL — ABNORMAL HIGH (ref 70–99)
Glucose-Capillary: 135 mg/dL — ABNORMAL HIGH (ref 70–99)
Glucose-Capillary: 142 mg/dL — ABNORMAL HIGH (ref 70–99)
Glucose-Capillary: 147 mg/dL — ABNORMAL HIGH (ref 70–99)
Glucose-Capillary: 156 mg/dL — ABNORMAL HIGH (ref 70–99)
Glucose-Capillary: 160 mg/dL — ABNORMAL HIGH (ref 70–99)
Glucose-Capillary: 164 mg/dL — ABNORMAL HIGH (ref 70–99)
Glucose-Capillary: 166 mg/dL — ABNORMAL HIGH (ref 70–99)
Glucose-Capillary: 173 mg/dL — ABNORMAL HIGH (ref 70–99)
Glucose-Capillary: 181 mg/dL — ABNORMAL HIGH (ref 70–99)
Glucose-Capillary: 187 mg/dL — ABNORMAL HIGH (ref 70–99)
Glucose-Capillary: 201 mg/dL — ABNORMAL HIGH (ref 70–99)
Glucose-Capillary: 203 mg/dL — ABNORMAL HIGH (ref 70–99)
Glucose-Capillary: 223 mg/dL — ABNORMAL HIGH (ref 70–99)

## 2020-04-20 LAB — URINALYSIS, ROUTINE W REFLEX MICROSCOPIC
Bilirubin Urine: NEGATIVE
Glucose, UA: >=500 mg/dL — AB
Ketones, ur: 20 mg/dL — AB
Leukocytes,Ua: NEGATIVE
Nitrite: NEGATIVE
Protein, ur: >=300 mg/dL — AB
Specific Gravity, Urine: 1.014 (ref 1.005–1.030)
pH: 6 (ref 5.0–8.0)

## 2020-04-20 LAB — FERRITIN: Ferritin: 31 ng/mL (ref 11–307)

## 2020-04-20 LAB — RETICULOCYTES
Immature Retic Fract: 12.2 % (ref 2.3–15.9)
RBC.: 4.04 MIL/uL (ref 3.87–5.11)
Retic Count, Absolute: 73.9 10*3/uL (ref 19.0–186.0)
Retic Ct Pct: 1.8 % (ref 0.4–3.1)

## 2020-04-20 LAB — VITAMIN B12: Vitamin B-12: 1697 pg/mL — ABNORMAL HIGH (ref 180–914)

## 2020-04-20 LAB — BETA-HYDROXYBUTYRIC ACID: Beta-Hydroxybutyric Acid: 2.41 mmol/L — ABNORMAL HIGH (ref 0.05–0.27)

## 2020-04-20 LAB — FOLATE: Folate: 26.9 ng/mL (ref >5.9)

## 2020-04-20 LAB — SARS CORONAVIRUS 2 (TAT 6-24 HRS): SARS Coronavirus 2: NEGATIVE

## 2020-04-20 IMAGING — DX DG CHEST 1V PORT
1 series · 1 of 1 positions shown · non-contrast
Comparison: [DATE]

CLINICAL DATA: Nausea and vomiting.

EXAM:
PORTABLE CHEST 1 VIEW

[chest ap]
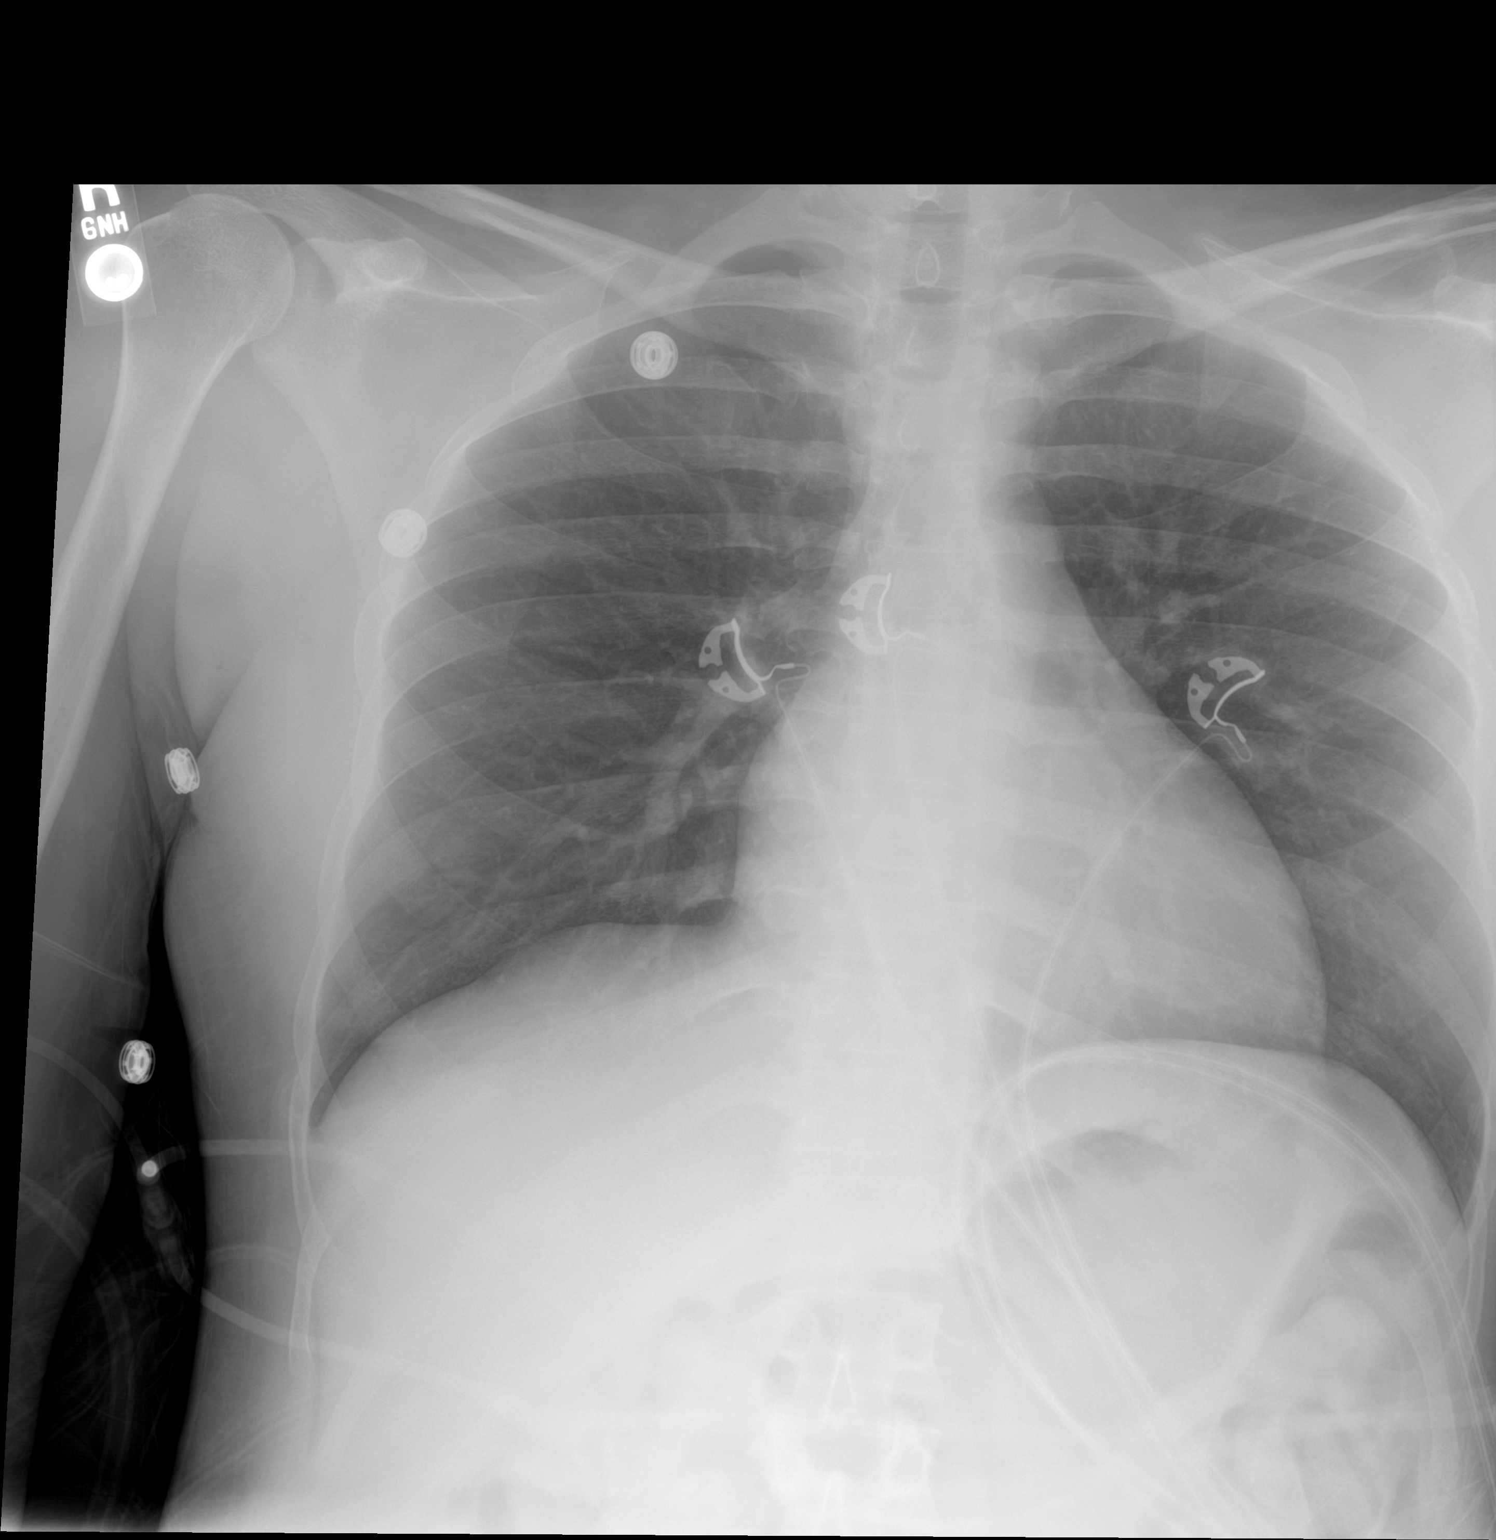

[1 of 1 positions shown; findings below may reference images not displayed]

FINDINGS: Lungs are adequately inflated without consolidation, effusion or
pneumothorax. Cardiomediastinal silhouette and remainder of the exam
is unchanged.
IMPRESSION: No active disease.

## 2020-04-20 MED ORDER — POTASSIUM CHLORIDE CRYS ER 20 MEQ PO TBCR
40.0000 meq | EXTENDED_RELEASE_TABLET | Freq: Once | ORAL | Status: AC
  Administered 2020-04-20: 40 meq via ORAL
  Filled 2020-04-20: qty 2

## 2020-04-20 MED ORDER — POTASSIUM CHLORIDE 10 MEQ/100ML IV SOLN
10.0000 meq | INTRAVENOUS | Status: AC
  Administered 2020-04-20 (×2): 10 meq via INTRAVENOUS
  Filled 2020-04-20 (×2): qty 100

## 2020-04-20 MED ORDER — CARVEDILOL 25 MG PO TABS
25.0000 mg | ORAL_TABLET | Freq: Two times a day (BID) | ORAL | Status: DC
  Administered 2020-04-20 – 2020-04-22 (×5): 25 mg via ORAL
  Filled 2020-04-20 (×3): qty 2
  Filled 2020-04-20: qty 1
  Filled 2020-04-20: qty 2

## 2020-04-20 MED ORDER — CHLORHEXIDINE GLUCONATE CLOTH 2 % EX PADS
6.0000 | MEDICATED_PAD | Freq: Every day | CUTANEOUS | Status: DC
  Administered 2020-04-20 – 2020-04-22 (×3): 6 via TOPICAL

## 2020-04-20 MED ORDER — INSULIN ASPART 100 UNIT/ML ~~LOC~~ SOLN
0.0000 [IU] | Freq: Three times a day (TID) | SUBCUTANEOUS | Status: DC
  Administered 2020-04-20: 2 [IU] via SUBCUTANEOUS
  Administered 2020-04-21: 1 [IU] via SUBCUTANEOUS
  Administered 2020-04-21: 2 [IU] via SUBCUTANEOUS

## 2020-04-20 MED ORDER — HYDRALAZINE HCL 50 MG PO TABS
75.0000 mg | ORAL_TABLET | Freq: Three times a day (TID) | ORAL | Status: DC
  Administered 2020-04-20 – 2020-04-22 (×6): 75 mg via ORAL
  Filled 2020-04-20 (×6): qty 1

## 2020-04-20 MED ORDER — PANTOPRAZOLE SODIUM 40 MG IV SOLR
40.0000 mg | Freq: Every day | INTRAVENOUS | Status: DC
  Administered 2020-04-20: 40 mg via INTRAVENOUS
  Filled 2020-04-20 (×2): qty 40

## 2020-04-20 MED ORDER — METOCLOPRAMIDE HCL 5 MG/ML IJ SOLN
5.0000 mg | Freq: Four times a day (QID) | INTRAMUSCULAR | Status: DC
  Administered 2020-04-20 – 2020-04-22 (×8): 5 mg via INTRAVENOUS
  Filled 2020-04-20 (×8): qty 2

## 2020-04-20 MED ORDER — SODIUM CHLORIDE 0.9 % IV SOLN: INTRAVENOUS | Status: DC

## 2020-04-20 MED ORDER — SODIUM BICARBONATE 650 MG PO TABS
325.0000 mg | ORAL_TABLET | Freq: Four times a day (QID) | ORAL | Status: DC
  Administered 2020-04-20 – 2020-04-22 (×8): 325 mg via ORAL
  Filled 2020-04-20 (×8): qty 1

## 2020-04-20 MED ORDER — INSULIN GLARGINE 100 UNIT/ML ~~LOC~~ SOLN
10.0000 [IU] | Freq: Every day | SUBCUTANEOUS | Status: DC
  Administered 2020-04-20 – 2020-04-22 (×3): 10 [IU] via SUBCUTANEOUS
  Filled 2020-04-20 (×3): qty 0.1

## 2020-04-20 MED ORDER — SERTRALINE HCL 100 MG PO TABS
100.0000 mg | ORAL_TABLET | Freq: Every morning | ORAL | Status: DC
  Administered 2020-04-20 – 2020-04-22 (×3): 100 mg via ORAL
  Filled 2020-04-20 (×3): qty 1

## 2020-04-20 NOTE — Progress Notes (Signed)
PROGRESS NOTE    Barbara Donovan  [id number] DOB: 1990/02/25 DOA: 04/19/2020 PCP: Fanny Bien, MD   Chief Complaint  Patient presents with  . Emesis    Brief Narrative:  Patient 30 year old female history of chronic kidney disease stage V, IDDM with gastroparesis, hypertension recently hospitalized for DKA and acute on chronic kidney disease presented with nausea vomiting after eating pineapple and unable to keep anything down since then.  Patient noted to have tried Reglan and Phenergan with no significant improvement and only able to keep down ice chips. Patient presented to the ED noted to be in DKA started on insulin drip and admitted.   Assessment & Plan:   Principal Problem:   DKA (diabetic ketoacidosis) ([place]) Active Problems:   Uncontrolled type 1 diabetes mellitus with stage 5 chronic kidney disease not on chronic dialysis, with long-term current use of insulin (HCC)   Acute depression   Intractable nausea and vomiting   Hypertensive urgency   Hypokalemia   Diabetic gastroparesis (HCC)   Anemia due to stage 5 chronic kidney disease (HCC)  #1 diabetic ketoacidosis (hemoglobin A1c 7.9-03/21/2020) Patient had presented in DKA with intractable nausea vomiting.??  Etiology. -Patient states compliance with medications. -Urinalysis not done on admission.  Chest x-ray not done.  EKG with sinus tachycardia. -High-sensitivity troponin elevated but flattened. -Patient with no chest pain. -Beta hydroxybutyrate elevated on presentation at 4.80 and trending down currently at 2.41. -Patient noted to be acidotic on presentation with a bicarb of 14, blood glucose of 302, anion gap of 19 going up as high as 20. -Patient on insulin drip/Endo tool with recent anion gap of 13, bicarb of 17. -CBG of 147 this morning. -Repeat labs pending. -Check a chest x-ray, UA with cultures and sensitivities. -Continue Endo tool pending labs and once bicarb has improved, gap remains closed  could transition back to home regimen of Lantus 10 units daily with sensitive sliding scale.  Once patient has been transitioned will place on a carb modified diet and change IV fluids from D5 half-normal saline to normal saline. -May have water/noncaloric beverages. -Follow.  2.  Intractable nausea/vomiting/history of diabetic gastroparesis -Questionable etiology.  Likely secondary to problem #1. -Clinical improvement. -Patient with recent gastric emptying study done on 04/18/2020 with results pending. -Change IV Reglan to 5 mg IV every 6 hours. -IV PPI.  3.  Hypokalemia Secondary to problem #1.  Check magnesium level.  KCl 10 mEq IV every 1 hour x2 rounds.  4.  Anemia Likely anemia of chronic disease secondary to chronic kidney disease. -Check an anemia panel. -Follow H&H. -Transfusion threshold hemoglobin < 7.  5.  Hypertensive urgency -Currently on Cardene drip.  Wean Cardene drip. -Continue clonidine patch.  Resume home regimen Coreg. -Would likely resume home regimen Norvasc and hydralazine in the next 24 hours.  6.  Chronic kidney disease stage V Baseline creatinine 4.5-5.5. -Currently at baseline. -Urine output of 1.5 L over the past 24 hours. -Once tolerating oral intake will resume home regimen of bicarb tablets. -Outpatient follow-up with nephrology.  7.  Elevated troponin -Likely secondary to demand ischemia secondary to problem #1. -Patient denies any chest pain.  EKG with a sinus tachycardia. -2D echo from 12/23/2017 with a EF of 60 to 65%, no wall motion abnormalities. -Follow.  8.  Depression -Resume home regimen Zoloft.   DVT prophylaxis: Heparin Code Status: Full Family Communication: Updated patient.  No family at bedside. Disposition:   Status is: Inpatient    Dispo:  The patient is from: Home              Anticipated d/c is to: Home              Patient currently on Endo tool, IV fluids, currently n.p.o., not stable for discharge.   Difficult to  place patient no       Consultants:   None  Procedures:  Chest x-ray pending  Antimicrobials:   None   Subjective: Alert.  Denies any further nausea or emesis.  Overall feeling better.  No chest pain.  No shortness of breath.  No abdominal pain.  No dysuria. Asking for water.  Objective: Vitals:   04/20/20 0500 04/20/20 0600 04/20/20 0800 04/20/20 0900  BP: (!) 153/77 (!) 179/91  (!) 153/87  Pulse: (!) 103 (!) 122  (!) 130  Resp:      Temp:   99.6 F (37.6 C)   TempSrc:   Oral   SpO2: 97% 99%  99%  Weight:      Height:        Intake/Output Summary (Last 24 hours) at 04/20/2020 0926 Last data filed at 04/20/2020 0900 Gross per 24 hour  Intake 980.8 ml  Output 2000 ml  Net -1019.2 ml   Filed Weights   04/19/20 1630  Weight: 71.3 kg    Examination:  General exam: Appears calm and comfortable.  Dry mucous membranes. Respiratory system: Clear to auscultation. Respiratory effort normal. Cardiovascular system: Tachycardia. No JVD, murmurs, rubs, gallops or clicks. No pedal edema. Gastrointestinal system: Abdomen is nondistended, soft and nontender. No organomegaly or masses felt. Normal bowel sounds heard. Central nervous system: Alert and oriented. No focal neurological deficits. Extremities: Symmetric 5 x 5 power. Skin: No rashes, lesions or ulcers Psychiatry: Judgement and insight appear normal. Mood & affect appropriate.     Data Reviewed: I have personally reviewed following labs and imaging studies  CBC: Recent Labs  Lab 04/19/20 1240 04/20/20 0552  WBC 9.6 11.8*  HGB 11.7* 10.5*  HCT 35.4* 32.9*  MCV 78.0* 80.0  PLT 506* 479*    Basic Metabolic Panel: Recent Labs  Lab 04/19/20 1919 04/19/20 2334 04/20/20 0320 04/20/20 0552 04/20/20 0842  NA 143 138 139 139 139  K 4.1 3.1* 3.1* 3.3* 3.2*  CL 111 110 111 109 112*  CO2 14* 16* 16* 17* 16*  GLUCOSE 157* 234* 175* 178* 171*  BUN 42* 41* 40* 39* 36*  CREATININE 5.47* 5.67* 5.49* 5.46*  5.82*  CALCIUM 8.9 8.2* 8.5* 8.6* 8.4*    GFR: Estimated Creatinine Clearance: 14.4 mL/min (A) (by C-G formula based on SCr of 5.82 mg/dL (H)).  Liver Function Tests: Recent Labs  Lab 04/19/20 1240  AST 16  ALT 15  ALKPHOS 81  BILITOT 1.0  PROT 7.7  ALBUMIN 2.8*    CBG: Recent Labs  Lab 04/20/20 0347 04/20/20 0538 04/20/20 0636 04/20/20 0741 04/20/20 0847  GLUCAP 164* 181* 201* 156* 147*     Recent Results (from the past 240 hour(s))  SARS CORONAVIRUS 2 (TAT 6-24 HRS) Nasopharyngeal Nasopharyngeal Swab     Status: None   Collection Time: 04/19/20  1:54 PM   Specimen: Nasopharyngeal Swab  Result Value Ref Range Status   SARS Coronavirus 2 NEGATIVE NEGATIVE Final    Comment: (NOTE) SARS-CoV-2 target nucleic acids are NOT DETECTED.  The SARS-CoV-2 RNA is generally detectable in upper and lower respiratory specimens during the acute phase of infection. Negative results do not preclude SARS-CoV-2 infection, do  not rule out co-infections with other pathogens, and should not be used as the sole basis for treatment or other patient management decisions. Negative results must be combined with clinical observations, patient history, and epidemiological information. The expected result is Negative.  Fact Sheet for Patients: SugarRoll.be  Fact Sheet for Healthcare Providers: https://www.woods-mathews.com/  This test is not yet approved or cleared by the [place] FDA and  has been authorized for detection and/or diagnosis of SARS-CoV-2 by FDA under an Emergency Use Authorization (EUA). This EUA will remain  in effect (meaning this test can be used) for the duration of the COVID-19 declaration under Se ction 564(b)(1) of the Act, 21 U.S.C. section 360bbb-3(b)(1), unless the authorization is terminated or revoked sooner.  Performed at [facility], [place] [address], [place], [place] [address]   MRSA PCR Screening      Status: None   Collection Time: 04/19/20  4:56 PM   Specimen: Nasopharyngeal  Result Value Ref Range Status   MRSA by PCR NEGATIVE NEGATIVE Final    Comment:        The GeneXpert MRSA Assay (FDA approved for NASAL specimens only), is one component of a comprehensive MRSA colonization surveillance program. It is not intended to diagnose MRSA infection nor to guide or monitor treatment for MRSA infections. Performed at [facility], [place] [address], [place],  [address]          Radiology Studies: No results found.      Scheduled Meds: . carvedilol  25 mg Oral BID  . Chlorhexidine Gluconate Cloth  6 each Topical Daily  . cloNIDine  0.2 mg Transdermal Weekly  . heparin  5,000 Units Subcutaneous Q8H  . metoCLOPramide (REGLAN) injection  5 mg Intravenous Q6H  . pantoprazole (PROTONIX) IV  40 mg Intravenous Daily  . sertraline  100 mg Oral q morning   Continuous Infusions: . sodium chloride 125 mL/hr at 04/19/20 1630  . dextrose 5 % and 0.45% NaCl 125 mL/hr at 04/19/20 1811  . insulin 0.7 mL/hr at 04/19/20 1811  . niCARDipine 5.5 mg/hr (04/20/20 0757)     LOS: 1 day    Time spent: 40 minutes    Irine Seal, MD [facility]   To contact the attending provider between 7A-7P or the covering provider during after hours 7P-7A, please log into the web site [email or web address] and access using universal [place] password for that web site. If you do not have the password, please call the hospital operator.  04/20/2020, 9:26 AM

## 2020-04-21 LAB — CBC WITH DIFFERENTIAL/PLATELET
Abs Immature Granulocytes: 0.05 10*3/uL (ref 0.00–0.07)
Basophils Absolute: 0.1 10*3/uL (ref 0.0–0.1)
Basophils Relative: 1 %
Eosinophils Absolute: 0 10*3/uL (ref 0.0–0.5)
Eosinophils Relative: 0 %
HCT: 29 % — ABNORMAL LOW (ref 36.0–46.0)
Hemoglobin: 9.1 g/dL — ABNORMAL LOW (ref 12.0–15.0)
Immature Granulocytes: 1 %
Lymphocytes Relative: 29 %
Lymphs Abs: 2.7 10*3/uL (ref 0.7–4.0)
MCH: 25.6 pg — ABNORMAL LOW (ref 26.0–34.0)
MCHC: 31.4 g/dL (ref 30.0–36.0)
MCV: 81.5 fL (ref 80.0–100.0)
Monocytes Absolute: 0.9 10*3/uL (ref 0.1–1.0)
Monocytes Relative: 9 %
Neutro Abs: 5.7 10*3/uL (ref 1.7–7.7)
Neutrophils Relative %: 60 %
Platelets: 374 10*3/uL (ref 150–400)
RBC: 3.56 MIL/uL — ABNORMAL LOW (ref 3.87–5.11)
RDW: 14.6 % (ref 11.5–15.5)
WBC: 9.4 10*3/uL (ref 4.0–10.5)
nRBC: 0 % (ref 0.0–0.2)

## 2020-04-21 LAB — BASIC METABOLIC PANEL
Anion gap: 8 (ref 5–15)
BUN: 34 mg/dL — ABNORMAL HIGH (ref 6–20)
CO2: 17 mmol/L — ABNORMAL LOW (ref 22–32)
Calcium: 8.1 mg/dL — ABNORMAL LOW (ref 8.9–10.3)
Chloride: 110 mmol/L (ref 98–111)
Creatinine, Ser: 5.15 mg/dL — ABNORMAL HIGH (ref 0.44–1.00)
GFR, Estimated: 11 mL/min — ABNORMAL LOW (ref >60)
Glucose, Bld: 113 mg/dL — ABNORMAL HIGH (ref 70–99)
Potassium: 3.7 mmol/L (ref 3.5–5.1)
Sodium: 135 mmol/L (ref 135–145)

## 2020-04-21 LAB — URINE CULTURE: Culture: <10000 — AB

## 2020-04-21 LAB — MAGNESIUM: Magnesium: 1.7 mg/dL (ref 1.7–2.4)

## 2020-04-21 LAB — GLUCOSE, CAPILLARY
Glucose-Capillary: 158 mg/dL — ABNORMAL HIGH (ref 70–99)
Glucose-Capillary: 146 mg/dL — ABNORMAL HIGH (ref 70–99)
Glucose-Capillary: 220 mg/dL — ABNORMAL HIGH (ref 70–99)
Glucose-Capillary: 195 mg/dL — ABNORMAL HIGH (ref 70–99)

## 2020-04-21 MED ORDER — AMLODIPINE BESYLATE 10 MG PO TABS: 10.0000 mg | ORAL_TABLET | Freq: Every day | ORAL | Status: DC

## 2020-04-21 MED ORDER — AMLODIPINE BESYLATE 10 MG PO TABS
10.0000 mg | ORAL_TABLET | Freq: Every day | ORAL | Status: DC
  Administered 2020-04-21 – 2020-04-22 (×2): 10 mg via ORAL
  Filled 2020-04-21 (×2): qty 1

## 2020-04-21 MED ORDER — RENA-VITE PO TABS
1.0000 | ORAL_TABLET | Freq: Every day | ORAL | Status: DC
  Administered 2020-04-21: 1 via ORAL
  Filled 2020-04-21: qty 1

## 2020-04-21 MED ORDER — MAGNESIUM SULFATE 2 GM/50ML IV SOLN
2.0000 g | Freq: Once | INTRAVENOUS | Status: AC
  Administered 2020-04-21: 2 g via INTRAVENOUS
  Filled 2020-04-21: qty 50

## 2020-04-21 MED ORDER — SCOPOLAMINE 1 MG/3DAYS TD PT72
1.0000 | MEDICATED_PATCH | TRANSDERMAL | Status: DC
  Administered 2020-04-21: 1.5 mg via TRANSDERMAL
  Filled 2020-04-21: qty 1

## 2020-04-21 MED ORDER — INSULIN ASPART 100 UNIT/ML ~~LOC~~ SOLN
4.0000 [IU] | Freq: Three times a day (TID) | SUBCUTANEOUS | Status: DC
  Administered 2020-04-22: 4 [IU] via SUBCUTANEOUS

## 2020-04-21 MED ORDER — LATANOPROST 0.005 % OP SOLN
1.0000 [drp] | Freq: Every day | OPHTHALMIC | Status: DC
  Administered 2020-04-21: 1 [drp] via OPHTHALMIC
  Filled 2020-04-21: qty 2.5

## 2020-04-21 MED ORDER — TRAZODONE HCL 50 MG PO TABS
50.0000 mg | ORAL_TABLET | Freq: Every day | ORAL | Status: DC
  Administered 2020-04-21: 50 mg via ORAL
  Filled 2020-04-21: qty 1

## 2020-04-21 MED ORDER — PANTOPRAZOLE SODIUM 40 MG PO TBEC
40.0000 mg | DELAYED_RELEASE_TABLET | Freq: Every day | ORAL | Status: DC
  Administered 2020-04-21 – 2020-04-22 (×2): 40 mg via ORAL
  Filled 2020-04-21 (×2): qty 1

## 2020-04-21 NOTE — Progress Notes (Signed)

## 2020-04-21 NOTE — TOC Initial Note (Signed)
Transition of Care [facility]) - Initial/Assessment Note    Patient Details  Name: Barbara Donovan MRN: [id number] Date of Birth: 02/28/1990  [facility]) CM/SW Contact:    Leeroy Cha, RN Phone Number: 04/21/2020, 7:58 AM  Clinical Narrative:                 Patient 30 year old female history of chronic kidney disease stage V, IDDM with gastroparesis, hypertension recently hospitalized for DKA and acute on chronic kidney disease presented with nausea vomiting after eating pineapple and unable to keep anything down since then.  Patient noted to have tried Reglan and Phenergan with no significant improvement and only able to keep down ice chips. Patient presented to the ED noted to be in DKA started on insulin drip and admitted.   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  Etiology. -Patient states compliance with medications. -Urinalysis not done on admission.  Chest x-ray not done.  EKG with sinus tachycardia. -High-sensitivity troponin elevated but flattened. -Patient with no chest pain. -Beta hydroxybutyrate elevated on presentation at 4.80 and trending down currently at 2.41. -Patient noted to be acidotic on presentation with a bicarb of 14, blood glucose of 302, anion gap of 19 going up as high as 20. -Patient on insulin drip/Endo tool with recent anion gap of 13, bicarb of 17. -CBG of 147 this morning. -Repeat labs pending. -Check a chest x-ray, UA with cultures and  sensitivities. -Continue Endo tool pending labs and once bicarb has improved, gap remains closed could transition back to home regimen of Lantus 10 units daily with sensitive sliding scale.  Once patient has been transitioned will place on a carb modified diet and change IV fluids from D5 half-normal saline to normal saline. -May have water/noncaloric beverages. -Follow.  2.  Intractable nausea/vomiting/history of diabetic gastroparesis -Questionable etiology.  Likely secondary to problem #1. -Clinical improvement. -Patient with recent gastric emptying study done on 04/18/2020 with results pending. -Change IV Reglan to 5 mg IV every 6 hours. -IV PPI.  3.  Hypokalemia Secondary to problem #1.  Check magnesium level.  KCl 10 mEq IV every 1 hour x2 rounds.  4.  Anemia Likely anemia of chronic disease secondary to chronic kidney disease. -Check an anemia panel. -Follow H&H. -Transfusion threshold hemoglobin < 7.  5.  Hypertensive urgency -Currently on Cardene drip.  Wean Cardene drip. -Continue clonidine patch.  Resume home regimen Coreg. -Would likely resume home regimen Norvasc and hydralazine in the next 24 hours.  6.  Chronic kidney disease stage V Baseline creatinine 4.5-5.5. -Currently at baseline. -Urine output of 1.5 L over the past 24 hours. -Once tolerating oral intake will resume home regimen of bicarb tablets. -Outpatient follow-up with nephrology.  7.  Elevated troponin -Likely secondary to demand ischemia secondary to problem #1. -Patient denies any chest pain.  EKG with a sinus tachycardia. -2D echo from 12/23/2017 with a EF of 60 to 65%, no wall motion abnormalities. -Follow.  8.  Depression -Resume home regimen Zoloft. PLAN: to  return to the home with self care.  Following for toc needs and progression.  Will need visit from Diabetic Coordinator   Expected Discharge Plan: Home/Self Care Barriers to Discharge: Continued Medical Work up   Patient Goals  and CMS Choice Patient states their goals for this hospitalization and ongoing recovery are:: to go back to my home      Expected Discharge Plan and Services Expected Discharge Plan: Home/Self Care   Discharge Planning Services: CM Consult   Living arrangements for the past 2 months: Apartment                                      Prior Living Arrangements/Services Living arrangements for the past 2 months: Apartment Lives with:: Significant Other Patient language and need for interpreter reviewed:: Yes        Need for Family Participation in Patient Care: Yes (Comment) Care giver support system in place?: Yes (comment)   Criminal Activity/Legal Involvement Pertinent to Current Situation/Hospitalization: No - Comment as needed  Activities of Daily Living Home Assistive Devices/Equipment: Eyeglasses ADL Screening (condition at time of admission) Patient's cognitive ability adequate to safely complete daily activities?: Yes Is the patient deaf or have difficulty hearing?: No Does the patient have difficulty seeing, even when wearing glasses/contacts?: No Does the patient have difficulty concentrating, remembering, or making decisions?: No Patient able to express need for assistance with ADLs?: Yes Does the patient have difficulty dressing or bathing?: No Independently performs ADLs?: Yes (appropriate for developmental age) Does the patient have difficulty walking or climbing stairs?: No Weakness of Legs: None Weakness of Arms/Hands: None  Permission Sought/Granted                  Emotional Assessment Appearance:: Appears stated age Attitude/Demeanor/Rapport: Engaged Affect (typically observed): Calm Orientation: : Oriented to Self,Oriented to Place,Oriented to  Time,Oriented to Situation Alcohol / Substance Use: Not Applicable Psych Involvement: No (comment)  Admission diagnosis:  DKA (diabetic ketoacidosis) ([place]) [E11.10] Hypertensive urgency  [I16.0] Diabetic ketoacidosis without coma associated with type 1 diabetes mellitus ([place]) [E10.10] Patient Active Problem List   Diagnosis Date Noted  . Nausea & vomiting   . Diabetic gastroparesis ([place]) 03/04/2020  . Anemia due to stage 5 chronic kidney disease ([place]) 03/04/2020  . Diabetic ketoacidosis without coma associated with type 1 diabetes mellitus ([place]) 02/09/2020  . Hypothermia 01/24/2020  . DKA (diabetic ketoacidosis) ([place]) 09/14/2019  . COVID-19 virus infection 09/14/2019  . SOB (shortness of breath) 03/27/2018  . Hypomagnesemia 01/28/2018  . Dehydration 01/28/2018  . Intractable nausea and vomiting 01/22/2018  . Hypertensive urgency 01/22/2018  . Hypokalemia 01/22/2018  . Nephropathy, diabetic ([place]) 12/29/2017  . Proteinuria due to type 1 diabetes mellitus ([place]) 11/02/2017  . Chronic hypertension 08/21/2017  . 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 Diabetic retinopathy ([place]) 06/09/2017  . Acute depression 11/02/2014  . Asthma 10/30/2013   PCP:  Fanny Bien, MD Pharmacy:   [id number] [id number]: [phone]: [phone]    Social Determinants of Health (SDOH) Interventions    Readmission Risk Interventions No flowsheet data found.

## 2020-04-21 NOTE — Progress Notes (Signed)
PROGRESS NOTE    Barbara Donovan  [id number] DOB: [birth year] DOA: 04/19/2020 PCP: Fanny Bien, MD   Chief Complaint  Patient presents with  . Emesis    Brief Narrative:  Patient 30 year old female history of chronic kidney disease stage V, IDDM with gastroparesis, hypertension recently hospitalized for DKA and acute on chronic kidney disease presented with nausea vomiting after eating pineapple and unable to keep anything down since then.  Patient noted to have tried Reglan and Phenergan with no significant improvement and only able to keep down ice chips. Patient presented to the ED noted to be in DKA started on insulin drip and admitted.   Assessment & Plan:   Principal Problem:   DKA (diabetic ketoacidosis) ([place]) Active Problems:   Uncontrolled type 1 diabetes mellitus with stage 5 chronic kidney disease not on chronic dialysis, with long-term current use of insulin (HCC)   Acute depression   Intractable nausea and vomiting   Hypertensive urgency   Hypokalemia   Diabetic gastroparesis (HCC)   Anemia due to stage 5 chronic kidney disease (HCC)   Nausea & vomiting  1 diabetic ketoacidosis (hemoglobin A1c 7.9-03/21/2020) Patient had presented in DKA with intractable nausea vomiting.??  Etiology. -Patient states compliance with medications. -Urinalysis not done on admission.  Chest x-ray not done.  EKG with sinus tachycardia. -High-sensitivity troponin elevated but flattened. -Patient with no chest pain. -Beta hydroxybutyrate elevated on presentation at 4.80 and trending down currently at 2.41. -Patient noted to be acidotic on presentation with a bicarb of 14, blood glucose of 302, anion gap of 19 going up as high as 20. -Patient was on insulin drip/Endo to, anion gap closed and patient transitioned to subcutaneous insulin. -Infectious work-up negative. -CBG 158 this morning. -Continue Lantus 10 units, sensitive sliding scale insulin. -Decrease IV fluids to 50 cc an  hour and discontinue the next 24 hours -Patient started on a carb modified diet which we will continue. -Continue IV Reglan, PPI. -Supportive care.  2.  Intractable nausea/vomiting/history of ?? diabetic gastroparesis -Questionable etiology.  Likely secondary to problem #1. -Clinical improvement. -Patient with recent gastric emptying study done on 04/18/2020 which was abnormal gastric emptying study  -Continue current regimen of Reglan 5 mg IV every 6 hours.   -PPI.   3.  Hypokalemia Secondary to problem #1.   -Magnesium at 1.7.   -Magnesium sulfate 2 g IV x1.   -Potassium repleted currently at 3.7.    4.  Anemia of chronic disease -Anemia panel consistent with anemia of chronic disease.  Iron level of 84, TIBC of 40, folate of 26.9, vitamin B12 of 1697.   -Hemoglobin stable at 9.1.  -Transfusion threshold hemoglobin < 7.  5.  Hypertensive urgency -Currently off Cardene drip.   -Continue clonidine patch, Coreg, hydralazine.   -Resume home regimen Norvasc.   -Follow.    6.  Chronic kidney disease stage V Baseline creatinine 4.5-5.5. -Renal function currently at baseline with a creatinine of 5.15.  -Urine output of 1 L over the past 24 hours.  -Resume home regimen of bicarb tablets.  -Outpatient follow-up with nephrology.   7.  Elevated troponin -Likely secondary to demand ischemia secondary to problem #1. -Patient denies any chest pain.  EKG with a sinus tachycardia. -2D echo from 12/23/2017 with a EF of 60 to 65%, no wall motion abnormalities. -Follow.  8.  Depression -Continue Zoloft.     DVT prophylaxis: Heparin Code Status: Full Family Communication: Updated patient.  No family at bedside.  Disposition:   Status is: Inpatient    Dispo: The patient is from: Home              Anticipated d/c is to: Home              Patient currently transition of and O2, still with hypertensive urgency and being transitioned off Cardene drip.  Not stable for discharge.      Difficult to place patient no       Consultants:   None  Procedures:  Chest x-ray 04/20/2020  Antimicrobials:   None   Subjective: Patient sitting up in bed eating breakfast.  Denies any chest pain or shortness of breath.  Denies any further nausea or emesis.  Feeling better than she did.  Stated had some loose stool yesterday.  Currently off Cardene drip.    Objective: Vitals:   04/21/20 0551 04/21/20 0553 04/21/20 0630 04/21/20 0800  BP: (!) 176/93  (!) 167/93   Pulse:  88 77   Resp:      Temp:    98.7 F (37.1 C)  TempSrc:    Oral  SpO2:  98% 97%   Weight:      Height:        Intake/Output Summary (Last 24 hours) at 04/21/2020 0930 Last data filed at 04/21/2020 0500 Gross per 24 hour  Intake 4859.01 ml  Output 500 ml  Net 4359.01 ml   Filed Weights   04/19/20 1630 04/21/20 0534  Weight: 71.3 kg 76.4 kg    Examination:  General exam: : NAD Respiratory system: CTA B anterior lung fields.  No wheezes, no rhonchi.  Speaking in full sentences.  Normal respiratory effort. Cardiovascular system: Regular rate and rhythm no murmurs rubs or gallops.  No JVD.  No lower extremity edema.  Gastrointestinal system: Abdomen soft, nontender, nondistended, positive bowel sounds.  No rebound.  No guarding. Central nervous system: Alert and oriented. No focal neurological deficits. Extremities: Symmetric 5 x 5 power. Skin: No rashes, lesions or ulcers Psychiatry: Judgement and insight appear normal. Mood & affect appropriate.  Data Reviewed: I have personally reviewed following labs and imaging studies  CBC: Recent Labs  Lab 04/19/20 1240 04/20/20 0552 04/21/20 0223  WBC 9.6 11.8* 9.4  NEUTROABS  --   --  5.7  HGB 11.7* 10.5* 9.1*  HCT 35.4* 32.9* 29.0*  MCV 78.0* 80.0 81.5  PLT 506* 479* [id number]    Basic Metabolic Panel: Recent Labs  Lab 04/20/20 0320 04/20/20 0552 04/20/20 0842 04/20/20 1131 04/21/20 0223  NA 139 139 139 138 135  K 3.1* 3.3* 3.2* 3.4* 3.7   CL 111 109 112* 111 110  CO2 16* 17* 16* 17* 17*  GLUCOSE 175* 178* 171* 150* 113*  BUN 40* 39* 36* 35* 34*  CREATININE 5.49* 5.46* 5.82* 5.35* 5.15*  CALCIUM 8.5* 8.6* 8.4* 8.2* 8.1*  MG  --   --   --   --  1.7    GFR: Estimated Creatinine Clearance: 16.8 mL/min (A) (by C-G formula based on SCr of 5.15 mg/dL (H)).  Liver Function Tests: Recent Labs  Lab 04/19/20 1240  AST 16  ALT 15  ALKPHOS 81  BILITOT 1.0  PROT 7.7  ALBUMIN 2.8*    CBG: Recent Labs  Lab 04/20/20 1304 04/20/20 1405 04/20/20 1652 04/20/20 2122 04/21/20 0754  GLUCAP 134* 142* 203* 160* 158*     Recent Results (from the past 240 hour(s))  SARS CORONAVIRUS 2 (TAT 6-24 HRS) Nasopharyngeal Nasopharyngeal Swab  Status: None   Collection Time: 04/19/20  1:54 PM   Specimen: Nasopharyngeal Swab  Result Value Ref Range Status   SARS Coronavirus 2 NEGATIVE NEGATIVE Final    Comment: (NOTE) SARS-CoV-2 target nucleic acids are NOT DETECTED.  The SARS-CoV-2 RNA is generally detectable in upper and lower respiratory specimens during the acute phase of infection. Negative results do not preclude SARS-CoV-2 infection, do not rule out co-infections with other pathogens, and should not be used as the sole basis for treatment or other patient management decisions. Negative results must be combined with clinical observations, patient history, and epidemiological information. The expected result is Negative.  Fact Sheet for Patients: SugarRoll.be  Fact Sheet for Healthcare Providers: https://www.woods-mathews.com/  This test is not yet approved or cleared by the [place] FDA and  has been authorized for detection and/or diagnosis of SARS-CoV-2 by FDA under an Emergency Use Authorization (EUA). This EUA will remain  in effect (meaning this test can be used) for the duration of the COVID-19 declaration under Se ction 564(b)(1) of the Act, 21 U.S.C. section  360bbb-3(b)(1), unless the authorization is terminated or revoked sooner.  Performed at [facility], [place] [address] [place], [place] [address]   MRSA PCR Screening     Status: None   Collection Time: 04/19/20  4:56 PM   Specimen: Nasopharyngeal  Result Value Ref Range Status   MRSA by PCR NEGATIVE NEGATIVE Final    Comment:        The GeneXpert MRSA Assay (FDA approved for NASAL specimens only), is one component of a comprehensive MRSA colonization surveillance program. It is not intended to diagnose MRSA infection nor to guide or monitor treatment for MRSA infections. Performed at [facility], [place] [address], [place], [place] [address]          Radiology Studies: DG CHEST PORT 1 VIEW  Result Date: 04/20/2020 CLINICAL DATA:  Nausea and vomiting. EXAM: PORTABLE CHEST 1 VIEW COMPARISON:  03/04/2020 FINDINGS: Lungs are adequately inflated without consolidation, effusion or pneumothorax. Cardiomediastinal silhouette and remainder of the exam is unchanged. IMPRESSION: No active disease. Electronically Signed   By: Marin Olp M.D.   On: 04/20/2020 11:50        Scheduled Meds: . amLODipine  10 mg Oral Daily  . carvedilol  25 mg Oral BID  . Chlorhexidine Gluconate Cloth  6 each Topical Daily  . cloNIDine  0.2 mg Transdermal Weekly  . heparin  5,000 Units Subcutaneous Q8H  . hydrALAZINE  75 mg Oral TID  . insulin aspart  0-6 Units Subcutaneous TID WC  . insulin glargine  10 Units Subcutaneous Daily  . latanoprost  1 drop Left Eye QHS  . metoCLOPramide (REGLAN) injection  5 mg Intravenous Q6H  . multivitamin  1 tablet Oral QHS  . pantoprazole  40 mg Oral Daily  . scopolamine  1 patch Transdermal Q72H  . sertraline  100 mg Oral q morning  . sodium bicarbonate  325 mg Oral QID  . traZODone  50 mg Oral QHS   Continuous Infusions: . sodium chloride 50 mL/hr at 04/21/20 0830     LOS: 2 days    Time spent 35 minutes    Irine Seal,  MD [facility]   To contact the attending provider between 7A-7P or the covering provider during after hours 7P-7A, please log into the web site [email or web address] and access using universal [place] password for that web site. If you do not have the  password, please call the hospital operator.  04/21/2020, 9:30 AM

## 2020-04-22 LAB — RENAL FUNCTION PANEL
Albumin: 2 g/dL — ABNORMAL LOW (ref 3.5–5.0)
Anion gap: 9 (ref 5–15)
BUN: 36 mg/dL — ABNORMAL HIGH (ref 6–20)
CO2: 16 mmol/L — ABNORMAL LOW (ref 22–32)
Calcium: 8 mg/dL — ABNORMAL LOW (ref 8.9–10.3)
Chloride: 110 mmol/L (ref 98–111)
Creatinine, Ser: 5.01 mg/dL — ABNORMAL HIGH (ref 0.44–1.00)
GFR, Estimated: 11 mL/min — ABNORMAL LOW (ref >60)
Glucose, Bld: 140 mg/dL — ABNORMAL HIGH (ref 70–99)
Phosphorus: 4.2 mg/dL (ref 2.5–4.6)
Potassium: 3.8 mmol/L (ref 3.5–5.1)
Sodium: 135 mmol/L (ref 135–145)

## 2020-04-22 LAB — CBC
HCT: 30.8 % — ABNORMAL LOW (ref 36.0–46.0)
Hemoglobin: 9.4 g/dL — ABNORMAL LOW (ref 12.0–15.0)
MCH: 25.3 pg — ABNORMAL LOW (ref 26.0–34.0)
MCHC: 30.5 g/dL (ref 30.0–36.0)
MCV: 82.8 fL (ref 80.0–100.0)
Platelets: 339 10*3/uL (ref 150–400)
RBC: 3.72 MIL/uL — ABNORMAL LOW (ref 3.87–5.11)
RDW: 14.3 % (ref 11.5–15.5)
WBC: 7.5 10*3/uL (ref 4.0–10.5)
nRBC: 0 % (ref 0.0–0.2)

## 2020-04-22 LAB — MAGNESIUM: Magnesium: 1.9 mg/dL (ref 1.7–2.4)

## 2020-04-22 LAB — GLUCOSE, CAPILLARY: Glucose-Capillary: 126 mg/dL — ABNORMAL HIGH (ref 70–99)

## 2020-04-22 MED ORDER — CLONIDINE 0.2 MG/24HR TD PTWK
0.2000 mg | MEDICATED_PATCH | TRANSDERMAL | 1 refills | Status: DC
Start: 2020-04-26 — End: 2020-06-09

## 2020-04-22 MED ORDER — SODIUM BICARBONATE 650 MG PO TABS: 650.0000 mg | ORAL_TABLET | Freq: Three times a day (TID) | ORAL | 1 refills | Status: DC

## 2020-04-22 NOTE — Plan of Care (Signed)

## 2020-04-22 NOTE — Plan of Care (Signed)
  Problem: Coping: Goal: Ability to adjust to condition or change in health will improve Outcome: Progressing   Problem: Fluid Volume: Goal: Ability to maintain a balanced intake and output will improve Outcome: Progressing   Problem: Metabolic: Goal: Ability to maintain appropriate glucose levels will improve Outcome: Progressing   Problem: Education: Goal: Knowledge of General Education information will improve Description: Including pain rating scale, medication(s)/side effects and non-pharmacologic comfort measures Outcome: Progressing   Problem: Pain Managment: Goal: General experience of comfort will improve Outcome: Progressing

## 2020-04-22 NOTE — Discharge Summary (Signed)
Physician Discharge Summary  ELIZZIE FLAHERTY [id number] DOB: December 30, 1990 DOA: 04/19/2020  PCP: Fanny Bien, MD  Admit date: 04/19/2020 Discharge date: 04/22/2020  Time spent: 55 minutes  Recommendations for Outpatient Follow-up:  1. Follow-up with Fanny Bien, MD in 2 weeks.  On follow-up patient will need a renal panel done to follow-up on electrolytes, renal function.  Patient also need a CBC done to follow-up on H&H.  Patient's diabetes will need to be followed up upon.   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 Marylyn Ishihara : Barbara Donovan is a 30 y.o. female with medical history significant of CKD5, IDDM, gastroparesis, HTN. Presenting with N/V. She was recently discharged from the hospital after a stay for DKA. She was doing well until 1 day prior to admission. She ate some pineapple and threw it up shortly after. From that point on, she has had intractable N/V. She has tried her reglan and phenergan, but it has not helped. She is only able to keep down ice chips. She denied hematemesis, fevers, and diarrhea. The morning of admission, when her symptoms didn't improve, she decided to come back to the ED. She denied any other aggravating or alleviating factors.   ED Course: She was found to have elevated BP. She was given labetalol and hydralazine. She was found to be in  DKA. She was started on an insulin gtt. TR[facility] was called for admission.   Hospital Course:  1 diabetic ketoacidosis (hemoglobin A1c 7.9-03/21/2020) Patient had presented in DKA with intractable nausea vomiting.??  Etiology. -Patient stated compliance with medications. -Urinalysis not done on admission.  Chest x-ray not done.  EKG with sinus tachycardia. -High-sensitivity troponin elevated but flattened. -Patient with no chest pain. -Beta hydroxybutyrate elevated on presentation at 4.80 and trending down currently at 2.41. -Patient noted to be acidotic on presentation with a bicarb of 14, blood glucose of 302, anion gap of 19 going up as high as 20. -Patient was on insulin drip/Endo to, anion gap closed and patient transitioned to subcutaneous insulin. -Infectious work-up negative. -Blood glucose levels improved. -Patient maintained on Lantus 10 units daily as well as sliding scale insulin and subsequently started on new coverage NovoLog 4 units 3 times daily  -Patient advanced to a carb modified diet which he tolerated.   -Patient improved clinically will be discharged home in stable and improved condition.  2.  Intractable nausea/vomiting/history of ?? diabetic gastroparesis -Questionable etiology.  Likely secondary to problem #1. -Patient hydrated with IV fluids, DKA treated with resolution of intractable nausea and vomiting. -Patient with recent gastric emptying study done on 04/18/2020 which was a normal gastric emptying study  -Patient initially was placed on Reglan 5 mg IV every 6 hours during the hospitalization and was discharged home on home regimen of Reglan 5 mg 3 times daily as needed. -Patient also maintained on  PPI -Outpatient follow-up with PCP.  3.  Hypokalemia Secondary to problem #1.   -Repleted.    4.  Anemia of chronic disease -Anemia panel consistent with anemia of chronic disease.  Iron level of 84, TIBC of 40, folate of 26.9, vitamin B12 of 1697.   -Hemoglobin  stabilized at 9.4 by day of discharge.   -Outpatient follow-up with PCP and nephrology.    5.  Hypertensive urgency -Patient initially was maintained on Cardene drip for hypertensive urgency on presentation.   -Patient's home regimen of Coreg and hydralazine subsequently resumed.  Patient started on a clonidine patch.   -Norvasc was also resumed.   -Cardene drip was subsequently weaned off.   -Blood pressure improved and patient was discharged home on home regimen of Coreg, hydralazine, Norvasc as well as a clonidine patch.   -Outpatient follow-up with PCP.   6.  Chronic kidney disease stage V Baseline creatinine 4.5-5.5. -Renal function remained around baseline during the hospitalization such that by day of discharge creatinine was 5.01 . -Patient with good urine output during the hospitalization.   -Home regimen of bicarb tablets were increased to 650 mg 3 times daily.  -Outpatient follow-up with nephrology.   7.  Elevated troponin -Likely secondary to demand ischemia secondary to problem #1. -Patient denied any chest pain.  EKG with a sinus tachycardia. -2D echo from 12/23/2017 with a EF of 60 to 65%, no wall motion abnormalities. -Outpatient follow-up with PCP  8.  Depression -Patient maintained on home regimen Zoloft.  Outpatient follow-up.       Procedures: Chest x-ray 04/20/2020  Consultations:  None  Discharge Exam: Vitals:   04/22/20 0535 04/22/20 0918  BP: (!) 146/88 (!) 175/102  Pulse: 65 69  Resp: 16 14  Temp: 98 F (36.7 C) 98.1 F (36.7 C)  SpO2: 97% 98%    General: NAD Cardiovascular: RRR Respiratory: CTAB  Discharge Instructions   Discharge Instructions    Diet Carb Modified   Complete by: As directed    Increase activity slowly   Complete by: As directed      Allergies as of 04/22/2020   No Known Allergies     Medication List    STOP taking these medications   cloNIDine 0.2 MG tablet Commonly known as: CATAPRES     TAKE these  medications   amLODipine 10 MG tablet Commonly known as: NORVASC Take 1 tablet (10 mg total) by mouth daily.   carvedilol 25 MG tablet Commonly known as: COREG Take 1 tablet (25 mg total) by mouth 2 (two) times daily.   cloNIDine 0.2 mg/24hr patch Commonly known as: CATAPRES - Dosed in mg/24 hr Place 1 patch (0.2 mg total) onto the skin once a week. Start taking on: April 26, 2020   Continuous Blood Gluc Sensor Misc 1 each by Does not apply route as directed. Use as directed every 14 days. May dispense FreeStyle [employer] or similar.   hydrALAZINE 25 MG tablet Commonly known as: APRESOLINE Take 3 tablets (75 mg total) by mouth 3 (three) times daily.   insulin lispro 100 UNIT/ML KwikPen Commonly known as: HumaLOG KwikPen 3 times a day (just before each meal) 5-4-4 units   Lantus SoloStar 100 UNIT/ML Solostar Pen Generic drug: insulin glargine Inject 10 Units into the skin daily.   latanoprost 0.005 % ophthalmic solution Commonly known as: Xalatan Place 1 drop into the left eye at bedtime.   metoCLOPramide 5 MG tablet Commonly known as: REGLAN Take 1 tablet (  5 mg total) by mouth every 8 (eight) hours as needed for nausea or vomiting.   multivitamin Tabs tablet Take 1 tablet by mouth at bedtime.   pantoprazole 40 MG tablet Commonly known as: PROTONIX Take 1 tablet (40 mg total) by mouth daily.   scopolamine 1 MG/3DAYS Commonly known as: TRANSDERM-SCOP Place 1 patch (1.5 mg total) onto the skin every 3 (three) days.   sertraline 100 MG tablet Commonly known as: ZOLOFT Take 100 mg by mouth every morning.   sodium bicarbonate 650 MG tablet Take 1 tablet (650 mg total) by mouth 3 (three) times daily. What changed:   medication strength  how much to take  when to take this   traZODone 50 MG tablet Commonly known as: DESYREL Take 50 mg by mouth at bedtime.      No Known Allergies  Follow-up Information    Fanny Bien, MD. Schedule an  appointment as soon as possible for a visit in 2 week(s).   Specialty: Family Medicine Contact information: [address] [place] [address] [phone]        Sueanne Margarita, MD .   Specialty: Cardiology Contact information: [id number] [address] [place] [address] [phone]                The results of significant diagnostics from this hospitalization (including imaging, microbiology, ancillary and laboratory) are listed below for reference.    Significant Diagnostic Studies: CT HEAD WO CONTRAST  Result Date: 04/12/2020 CLINICAL DATA:  Dizziness EXAM: CT HEAD WITHOUT CONTRAST TECHNIQUE: Contiguous axial images were obtained from the base of the skull through the vertex without intravenous contrast. COMPARISON:  01/24/2020 FINDINGS: Brain: No evidence of acute infarction, hemorrhage, hydrocephalus, extra-axial collection or mass lesion/mass effect. Vascular: No hyperdense vessel or unexpected calcification. Skull: Normal. Negative for fracture or focal lesion. Sinuses/Orbits: No acute finding. Other: None. IMPRESSION: No acute intracranial abnormality noted. Electronically Signed   By: Inez Catalina M.D.   On: 04/12/2020 00:24   NM Gastric Emptying  Result Date: 04/20/2020 CLINICAL DATA:  Nausea and vomiting EXAM: NUCLEAR MEDICINE GASTRIC EMPTYING SCAN TECHNIQUE: After oral ingestion of radiolabeled meal, sequential abdominal images were obtained for 4 hours. Percentage of activity emptying the stomach was calculated at 1 hour, 2 hour, 3 hour, and 4 hours. RADIOPHARMACEUTICALS:  1.9 mCi Tc-36msulfur colloid in standardized meal COMPARISON:  None. FINDINGS: Expected location of the stomach in the left upper quadrant. Ingested meal empties the stomach gradually over the course of the study. 22% emptied at 1 hr ( normal >= 10%) 82% emptied at 2 hr ( normal >= 40%) 92% emptied at 3 hr ( normal >= 70%) 100% emptied at 4 hr ( normal >= 90%) IMPRESSION: Normal  gastric emptying study. Electronically Signed   By: CConstance HolsterM.D.   On: 04/20/2020 19:23   DG CHEST PORT 1 VIEW  Result Date: 04/20/2020 CLINICAL DATA:  Nausea and vomiting. EXAM: PORTABLE CHEST 1 VIEW COMPARISON:  03/04/2020 FINDINGS: Lungs are adequately inflated without consolidation, effusion or pneumothorax. Cardiomediastinal silhouette and remainder of the exam is unchanged. IMPRESSION: No active disease. Electronically Signed   By: DMarin OlpM.D.   On: 04/20/2020 11:50    Microbiology: Recent Results (from the past 240 hour(s))  SARS CORONAVIRUS 2 (TAT 6-24 HRS) Nasopharyngeal Nasopharyngeal Swab     Status: None   Collection Time: 04/19/20  1:54 PM   Specimen: Nasopharyngeal Swab  Result Value Ref  Range Status   SARS Coronavirus 2 NEGATIVE NEGATIVE Final    Comment: (NOTE) SARS-CoV-2 target nucleic acids are NOT DETECTED.  The SARS-CoV-2 RNA is generally detectable in upper and lower respiratory specimens during the acute phase of infection. Negative results do not preclude SARS-CoV-2 infection, do not rule out co-infections with other pathogens, and should not be used as the sole basis for treatment or other patient management decisions. Negative results must be combined with clinical observations, patient history, and epidemiological information. The expected result is Negative.  Fact Sheet for Patients: SugarRoll.be  Fact Sheet for Healthcare Providers: https://www.woods-mathews.com/  This test is not yet approved or cleared by the [place] FDA and  has been authorized for detection and/or diagnosis of SARS-CoV-2 by FDA under an Emergency Use Authorization (EUA). This EUA will remain  in effect (meaning this test can be used) for the duration of the COVID-19 declaration under Se ction 564(b)(1) of the Act, 21 U.S.C. section 360bbb-3(b)(1), unless the authorization is terminated or revoked sooner.  Performed  at [facility], [place] [address] [place], [place] [address]   MRSA PCR Screening     Status: None   Collection Time: 04/19/20  4:56 PM   Specimen: Nasopharyngeal  Result Value Ref Range Status   MRSA by PCR NEGATIVE NEGATIVE Final    Comment:        The GeneXpert MRSA Assay (FDA approved for NASAL specimens only), is one component of a comprehensive MRSA colonization surveillance program. It is not intended to diagnose MRSA infection nor to guide or monitor treatment for MRSA infections. Performed at [facility], [place] [address], [place], [place] [address]   Culture, Urine     Status: Abnormal   Collection Time: 04/20/20  9:00 AM   Specimen: Urine, Catheterized  Result Value Ref Range Status   Specimen Description   Final    URINE, CATHETERIZED Performed at [place] [address] [place], [place] [address]    Special Requests   Final    NONE Performed at [facility], [place] [address] [place], [place] [address]    Culture (A)  Final    <10,000 COLONIES/mL INSIGNIFICANT GROWTH Performed at [place] [address], [place],  [address]    Report Status 04/21/2020 FINAL  Final     Labs: Basic Metabolic Panel: Recent Labs  Lab 04/20/20 0552 04/20/20 0842 04/20/20 1131 04/21/20 0223 04/22/20 0451  NA 139 139 138 135 135  K 3.3* 3.2* 3.4* 3.7 3.8  CL 109 112* 111 110 110  CO2 17* 16* 17* 17* 16*  GLUCOSE 178* 171* 150* 113* 140*  BUN 39* 36* 35* 34* 36*  CREATININE 5.46* 5.82* 5.35* 5.15* 5.01*  CALCIUM 8.6* 8.4* 8.2* 8.1* 8.0*  MG  --   --   --  1.7 1.9  PHOS  --   --   --   --  4.2   Liver Function Tests: Recent Labs  Lab 04/19/20 1240 04/22/20 0451  AST 16  --   ALT 15  --   ALKPHOS 81  --   BILITOT 1.0  --   PROT 7.7  --   ALBUMIN 2.8* 2.0*   Recent Labs  Lab 04/19/20 1240  LIPASE 33   No results for input(s): AMMONIA in the last 168 hours. CBC: Recent Labs   Lab 04/19/20 1240 04/20/20 0552 04/21/20 0223 04/22/20 0451  WBC 9.6 11.8* 9.4 7.5  NEUTROABS  --   --  5.7  --   HGB 11.7* 10.5* 9.1* 9.4*  HCT 35.4* 32.9* 29.0* 30.8*  MCV 78.0* 80.0 81.5 82.8  PLT 506* 479* 374 339   Cardiac Enzymes: No results for input(s): CKTOTAL, CKMB, CKMBINDEX, TROPONINI in the last 168 hours. BNP: BNP (last 3 results) No results for input(s): BNP in the last 8760 hours.  ProBNP (last 3 results) No results for input(s): PROBNP in the last 8760 hours.  CBG: Recent Labs  Lab 04/21/20 0754 04/21/20 1153 04/21/20 1623 04/21/20 2155 04/22/20 0734  GLUCAP 158* 146* 220* 195* 126*       Signed:  Irine Seal MD.  [facility] 04/22/2020, 10:08 AM

## 2020-04-25 ENCOUNTER — Other Ambulatory Visit: Payer: Self-pay

## 2020-04-25 ENCOUNTER — Ambulatory Visit (INDEPENDENT_AMBULATORY_CARE_PROVIDER_SITE_OTHER): Payer: 59 | Admitting: Endocrinology

## 2020-04-25 ENCOUNTER — Other Ambulatory Visit: Payer: Self-pay | Admitting: Endocrinology

## 2020-04-25 VITALS — BP 140/78 | HR 87 | Ht 66.0 in | Wt 171.4 lb

## 2020-04-25 DIAGNOSIS — E1065 Type 1 diabetes mellitus with hyperglycemia: Secondary | ICD-10-CM

## 2020-04-25 DIAGNOSIS — N185 Chronic kidney disease, stage 5: Secondary | ICD-10-CM

## 2020-04-25 DIAGNOSIS — IMO0002 Reserved for concepts with insufficient information to code with codable children: Secondary | ICD-10-CM

## 2020-04-25 DIAGNOSIS — E1022 Type 1 diabetes mellitus with diabetic chronic kidney disease: Secondary | ICD-10-CM

## 2020-04-25 MED ORDER — LANTUS SOLOSTAR 100 UNIT/ML ~~LOC~~ SOPN: 16.0000 [IU] | PEN_INJECTOR | SUBCUTANEOUS | 3 refills | Status: DC

## 2020-04-25 MED ORDER — INSULIN LISPRO (1 UNIT DIAL) 100 UNIT/ML (KWIKPEN): 2.0000 [IU] | PEN_INJECTOR | Freq: Three times a day (TID) | SUBCUTANEOUS | 3 refills | Status: DC

## 2020-04-25 NOTE — Progress Notes (Signed)
Subjective:    Patient ID: Barbara Donovan, female    DOB: Mar 23, 1990, 30 y.o.   MRN: [id number]  HPI Pt returns for f/u of diabetes mellitus:  DM type: 1 Dx'ed: 123456 Complications: PDR and stage 4 CRI.   Therapy: insulin since 2009, and pump rx since 2020.  DKA: last episode was 2022.  Severe hypoglycemia: once (2022) Pancreatitis: never Pancreatic imaging: never.  Other: she took pump rx 2020-2122; she has ongoing intermitt n/v, but GES was normal.    Interval history:  She eats meals at 8AM, 12N, and 6PM.  Pt says she sometimes misses the insulin doses.  I reviewed continuous glucose monitor data.  Glucose varies from 70-400.  It is in general highest at [place], and lowest at 12[place].  She was again in the hospital last week, with DKA.  Pt says this was due to a reaction to pineapple.  pt states she feels better in general now.   no cbg record, but states cbg varies from 87-248.  There is no trend throughout the day.  Nausea is much better.   Past Medical History:  Diagnosis Date  . Asthma    as a child, no problems as an adult, no inhaler  . Cataract    NS OU  . Chronic hypertension during pregnancy, antepartum 08/19/2017  . Dehydration 01/28/2018  . Depression during pregnancy, antepartum 07/07/2017   6/20: Short trial of zoloft previously, reports didn't help much but also didn't give it a chance Discussed r/b/a SSRIs in pregnancy, agrees to try Zoloft again, rx sent No SI/HI/red flags  . Diabetes ([place])    TYPE I  . 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  Marland Kitchen HTN (hypertension)   . Hypertensive retinopathy    OU  . Hypokalemia 01/22/2018  . Hypomagnesemia 01/28/2018  . Intractable nausea and vomiting 01/22/2018  . Intrauterine growth restriction (IUGR) affecting care of mother 12/22/2017  . Morbid obesity ([place])   . 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 Proteinuria due to type 1 diabetes  mellitus ([place]) 11/02/2017   Baseline Pr: Cr 10.23  . Severe hyperemesis gravidarum 10/30/2017  . Type I diabetes mellitus ([place]) 07/07/2017   Current Diabetic Medications:  Insulin  '[x]'$  Aspirin 81 mg daily after 12 weeks (? A2/B GDM)  Required Referrals for A1GDM or A2GDM: '[x]'$  Diabetes Education and Testing Supplies '[x]'$  Nutrition Cousult  For A2/B GDM or higher classes of DM '[x]'$  Diabetes Education and Testing Supplies '[x]'$  Nutrition Counsult '[x]'$  Fetal ECHO after 20 weeks  '[x]'$  Eye exam for retina evaluation - severe retinopathy 7/19  Base  . Ventricular septal defect (VSD) of fetus in singleton pregnancy, antepartum 09/30/2017   May go to newborn nursery per Dr. Lenard Simmer Echo prior to discharge    Past Surgical History:  Procedure Laterality Date  . [place] VITRECTOMY WITH 20 GAUGE MVR PORT FOR MACULAR HOLE Left 07/20/2018   Procedure: 25 GAUGE PARS PLANA VITRECTOMY LEFT EYE;  Surgeon: Bernarda Caffey, MD;  Location: [place] [facility] [place];  Service: Ophthalmology;  Laterality: Left;  . CESAREAN SECTION N/A 12/26/2017   Procedure: CESAREAN SECTION;  Surgeon: Osborne Oman, MD;  Location: [facility];  Service: Obstetrics;  Laterality: N/A;  . EYE EXAMINATION UNDER ANESTHESIA Right 07/20/2018   Procedure: Eye Exam Under Anesthesia RIGHT EYE;  Surgeon: Bernarda Caffey, MD;  Location: [facility];  Service: Ophthalmology;  Laterality: Right;  . EYE SURGERY Left 07/2018  .  GAS INSERTION Left 07/19/2019   Procedure: INSERTION OF GAS;  Surgeon: Bernarda Caffey, MD;  Location: [facility];  Service: Ophthalmology;  Laterality: Left;  SF6  . INJECTION OF SILICONE OIL Left [id number]   Procedure: Injection Of Silicone Oil LEFT EYE;  Surgeon: Bernarda Caffey, MD;  Location: [place] [facility];  Service: Ophthalmology;  Laterality: Left;  . LASER PHOTO ABLATION Right 07/20/2018   Procedure: Laser Photo Ablation RIGHT EYE;  Surgeon: Bernarda Caffey, MD;  Location: [facility];  Service: Ophthalmology;  Laterality: Right;  . MEMBRANE PEEL Left 07/20/2018    Procedure: Membrane Peel LEFT EYE;  Surgeon: Bernarda Caffey, MD;  Location: [facility];  Service: Ophthalmology;  Laterality: Left;  . MEMBRANE PEEL Left 07/19/2019   Procedure: MEMBRANE PEEL;  Surgeon: Bernarda Caffey, MD;  Location: [facility];  Service: Ophthalmology;  Laterality: Left;  . MITOMYCIN C APPLICATION Bilateral [id number]   Procedure: Avastin Application;  Surgeon: Bernarda Caffey, MD;  Location: [facility];  Service: Ophthalmology;  Laterality: Bilateral;  . PHOTOCOAGULATION WITH LASER Left 07/20/2018   Procedure: Photocoagulation With Laser LEFT EYE;  Surgeon: Bernarda Caffey, MD;  Location: [facility];  Service: Ophthalmology;  Laterality: Left;  . PHOTOCOAGULATION WITH LASER Left 07/19/2019   Procedure: PHOTOCOAGULATION WITH LASER;  Surgeon: Bernarda Caffey, MD;  Location: [facility];  Service: Ophthalmology;  Laterality: Left;  . RETINAL DETACHMENT SURGERY Left 07/20/2018   Dr. Bernarda Caffey  . SILICON OIL REMOVAL Left 07/19/2019   Procedure: 25g PARS PLANA VITRECTOMY WITH SILICON OIL REMOVAL;  Surgeon: Bernarda Caffey, MD;  Location: [facility];  Service: Ophthalmology;  Laterality: Left;  . WISDOM TOOTH EXTRACTION      Social History   Socioeconomic History  . Marital status: Significant Other    Spouse name: Not on file  . Number of children: Not on file  . Years of education: Not on file  . Highest education level: Not on file  Occupational History  . Not on file  Tobacco Use  . Smoking status: Never Smoker  . Smokeless tobacco: Never Used  Vaping Use  . Vaping Use: Never used  Substance and Sexual Activity  . Alcohol use: Not Currently    Comment: SOCIALLY  . Drug use: Never  . Sexual activity: Not Currently    Birth control/protection: Pill  Other Topics Concern  . Not on file  Social History Narrative  . Not on file   Social Determinants of Health   Financial Resource Strain: Not on file  Food Insecurity: Not on file  Transportation Needs: Not on file  Physical Activity: Not on file  Stress:  Not on file  Social Connections: Not on file  Intimate Partner Violence: Not on file    Current Outpatient Medications on File Prior to Visit  Medication Sig Dispense Refill  . amLODipine (NORVASC) 10 MG tablet Take 1 tablet (10 mg total) by mouth daily. 90 tablet 1  . carvedilol (COREG) 25 MG tablet Take 1 tablet (25 mg total) by mouth 2 (two) times daily. 180 tablet 3  . cloNIDine (CATAPRES - DOSED IN MG/24 HR) 0.2 mg/24hr patch Place 1 patch (0.2 mg total) onto the skin once a week. 4 patch 1  . Continuous Blood Gluc Sensor MISC 1 each by Does not apply route as directed. Use as directed every 14 days. May dispense FreeStyle [employer] or similar. 1 each 0  . hydrALAZINE (APRESOLINE) 25 MG tablet Take 3 tablets (75 mg total) by mouth 3 (three) times daily. 270 tablet  2  . latanoprost (XALATAN) 0.005 % ophthalmic solution Place 1 drop into the left eye at bedtime. 2.5 mL 6  . metoCLOPramide (REGLAN) 5 MG tablet Take 1 tablet (5 mg total) by mouth every 8 (eight) hours as needed for nausea or vomiting. 30 tablet 0  . multivitamin (RENA-VIT) TABS tablet Take 1 tablet by mouth at bedtime. 90 tablet 1  . pantoprazole (PROTONIX) 40 MG tablet Take 1 tablet (40 mg total) by mouth daily. 90 tablet 3  . scopolamine (TRANSDERM-SCOP) 1 MG/3DAYS Place 1 patch (1.5 mg total) onto the skin every 3 (three) days. 10 patch 12  . sertraline (ZOLOFT) 100 MG tablet Take 100 mg by mouth every morning.    . sodium bicarbonate 650 MG tablet Take 1 tablet (650 mg total) by mouth 3 (three) times daily. 90 tablet 1  . traZODone (DESYREL) 50 MG tablet Take 50 mg by mouth at bedtime.     No current facility-administered medications on file prior to visit.    No Known Allergies  Family History  Problem Relation Age of Onset  . Diabetes Mother   . Aneurysm Mother 68  . Seizures Mother   . Diabetes Father   . Cataracts Father   . COPD Father   . Heart attack Father   . Heart disease Father   .  Healthy Sister   . Healthy Daughter   . Stroke Maternal Grandfather   . Amblyopia Neg Hx   . Blindness Neg Hx   . Glaucoma Neg Hx   . Macular degeneration Neg Hx   . Retinal detachment Neg Hx   . Strabismus Neg Hx   . Retinitis pigmentosa Neg Hx   . Colon cancer Neg Hx   . Stomach cancer Neg Hx   . Esophageal cancer Neg Hx   . Pancreatic cancer Neg Hx   . Liver disease Neg Hx     BP 140/78 (BP Location: Right Arm, Patient Position: Sitting, Cuff Size: Normal)   Pulse 87   Ht '5\' 6"'$  (1.676 m)   Wt 171 lb 6.4 oz (77.7 kg)   SpO2 98%   BMI 27.66 kg/m    Review of Systems     Objective:   Physical Exam VITAL SIGNS:  See vs page.   GENERAL: no distress.   Pulses: dorsalis pedis intact bilat.   MSK: no deformity of the feet CV: 2+ bilat leg edema.   Skin:  no ulcer on the feet.  normal color and temp on the feet. Neuro: sensation is intact to touch on the feet.     Lab Results  Component Value Date   HGBA1C 7.9 (A) 03/21/2020       Assessment & Plan:  Type 1 DM: uncontrolled.  DKA: in view of this, she needs simplification of insulin schedule  Patient Instructions  please increase the Lantus to 16 units each morning, and reduce the humalog to 2 units, 3 times a day (just before each meal) check your blood sugar 5 times a day: before the 3 meals, and at bedtime.  also check if you have symptoms of your blood sugar being too high or too low.  please keep a record of the readings and bring it to your next appointment here (or you can bring the meter itself).  You can write it on any piece of paper.  please call us sooner if your blood sugar goes below 70, or if you have a lot of readings over 200.  Please come back for a follow-up appointment in 6 weeks.

## 2020-04-25 NOTE — Patient Instructions (Addendum)
please increase the Lantus to 16 units each morning, and reduce the humalog to 2 units, 3 times a day (just before each meal) check your blood sugar 5 times a day: before the 3 meals, and at bedtime.  also check if you have symptoms of your blood sugar being too high or too low.  please keep a record of the readings and bring it to your next appointment here (or you can bring the meter itself).  You can write it on any piece of paper.  please call us sooner if your blood sugar goes below 70, or if you have a lot of readings over 200.   Please come back for a follow-up appointment in 6 weeks.

## 2020-04-25 NOTE — Telephone Encounter (Signed)
Please advise 

## 2020-05-31 ENCOUNTER — Encounter (HOSPITAL_COMMUNITY): Payer: Self-pay

## 2020-05-31 ENCOUNTER — Other Ambulatory Visit: Payer: Self-pay

## 2020-05-31 ENCOUNTER — Inpatient Hospital Stay (HOSPITAL_COMMUNITY)
Admission: EM | Admit: 2020-05-31 | Discharge: 2020-06-09 | DRG: 637 | Disposition: A | Discharge status: Home / Self Care | Payer: 59 | Attending: Internal Medicine | Admitting: Internal Medicine

## 2020-05-31 DIAGNOSIS — N185 Chronic kidney disease, stage 5: Secondary | ICD-10-CM | POA: Diagnosis not present

## 2020-05-31 DIAGNOSIS — R630 Anorexia: Secondary | ICD-10-CM | POA: Diagnosis present

## 2020-05-31 DIAGNOSIS — R112 Nausea with vomiting, unspecified: Secondary | ICD-10-CM

## 2020-05-31 DIAGNOSIS — Z825 Family history of asthma and other chronic lower respiratory diseases: Secondary | ICD-10-CM | POA: Diagnosis not present

## 2020-05-31 DIAGNOSIS — Z20822 Contact with and (suspected) exposure to covid-19: Secondary | ICD-10-CM | POA: Diagnosis present

## 2020-05-31 DIAGNOSIS — D631 Anemia in chronic kidney disease: Secondary | ICD-10-CM | POA: Diagnosis present

## 2020-05-31 DIAGNOSIS — N189 Chronic kidney disease, unspecified: Secondary | ICD-10-CM | POA: Diagnosis present

## 2020-05-31 DIAGNOSIS — E1022 Type 1 diabetes mellitus with diabetic chronic kidney disease: Secondary | ICD-10-CM | POA: Diagnosis present

## 2020-05-31 DIAGNOSIS — Z823 Family history of stroke: Secondary | ICD-10-CM

## 2020-05-31 DIAGNOSIS — Z8249 Family history of ischemic heart disease and other diseases of the circulatory system: Secondary | ICD-10-CM | POA: Diagnosis not present

## 2020-05-31 DIAGNOSIS — Z992 Dependence on renal dialysis: Secondary | ICD-10-CM

## 2020-05-31 DIAGNOSIS — Z794 Long term (current) use of insulin: Secondary | ICD-10-CM

## 2020-05-31 DIAGNOSIS — I10 Essential (primary) hypertension: Secondary | ICD-10-CM | POA: Diagnosis present

## 2020-05-31 DIAGNOSIS — N179 Acute kidney failure, unspecified: Secondary | ICD-10-CM | POA: Diagnosis present

## 2020-05-31 DIAGNOSIS — Z79899 Other long term (current) drug therapy: Secondary | ICD-10-CM | POA: Diagnosis not present

## 2020-05-31 DIAGNOSIS — N186 End stage renal disease: Secondary | ICD-10-CM | POA: Diagnosis present

## 2020-05-31 DIAGNOSIS — J45909 Unspecified asthma, uncomplicated: Secondary | ICD-10-CM | POA: Diagnosis present

## 2020-05-31 DIAGNOSIS — E10319 Type 1 diabetes mellitus with unspecified diabetic retinopathy without macular edema: Secondary | ICD-10-CM | POA: Diagnosis present

## 2020-05-31 DIAGNOSIS — I120 Hypertensive chronic kidney disease with stage 5 chronic kidney disease or end stage renal disease: Secondary | ICD-10-CM | POA: Diagnosis present

## 2020-05-31 DIAGNOSIS — N2581 Secondary hyperparathyroidism of renal origin: Secondary | ICD-10-CM | POA: Diagnosis present

## 2020-05-31 DIAGNOSIS — F32A Depression, unspecified: Secondary | ICD-10-CM | POA: Diagnosis present

## 2020-05-31 DIAGNOSIS — E1043 Type 1 diabetes mellitus with diabetic autonomic (poly)neuropathy: Secondary | ICD-10-CM | POA: Diagnosis present

## 2020-05-31 DIAGNOSIS — Z6821 Body mass index (BMI) 21.0-21.9, adult: Secondary | ICD-10-CM

## 2020-05-31 DIAGNOSIS — E1110 Type 2 diabetes mellitus with ketoacidosis without coma: Secondary | ICD-10-CM | POA: Diagnosis present

## 2020-05-31 DIAGNOSIS — I160 Hypertensive urgency: Secondary | ICD-10-CM | POA: Diagnosis present

## 2020-05-31 DIAGNOSIS — K3184 Gastroparesis: Secondary | ICD-10-CM | POA: Diagnosis present

## 2020-05-31 DIAGNOSIS — E1010 Type 1 diabetes mellitus with ketoacidosis without coma: Principal | ICD-10-CM | POA: Diagnosis present

## 2020-05-31 DIAGNOSIS — Z833 Family history of diabetes mellitus: Secondary | ICD-10-CM | POA: Diagnosis not present

## 2020-05-31 DIAGNOSIS — E11319 Type 2 diabetes mellitus with unspecified diabetic retinopathy without macular edema: Secondary | ICD-10-CM | POA: Diagnosis present

## 2020-05-31 DIAGNOSIS — E1121 Type 2 diabetes mellitus with diabetic nephropathy: Secondary | ICD-10-CM | POA: Diagnosis present

## 2020-05-31 DIAGNOSIS — E860 Dehydration: Secondary | ICD-10-CM | POA: Diagnosis present

## 2020-05-31 LAB — GLUCOSE, CAPILLARY
Glucose-Capillary: 188 mg/dL — ABNORMAL HIGH (ref 70–99)
Glucose-Capillary: 174 mg/dL — ABNORMAL HIGH (ref 70–99)
Glucose-Capillary: 151 mg/dL — ABNORMAL HIGH (ref 70–99)
Glucose-Capillary: 183 mg/dL — ABNORMAL HIGH (ref 70–99)
Glucose-Capillary: 217 mg/dL — ABNORMAL HIGH (ref 70–99)
Glucose-Capillary: 200 mg/dL — ABNORMAL HIGH (ref 70–99)
Glucose-Capillary: 197 mg/dL — ABNORMAL HIGH (ref 70–99)

## 2020-05-31 LAB — CBC
HCT: 37 % (ref 36.0–46.0)
Hemoglobin: 11.8 g/dL — ABNORMAL LOW (ref 12.0–15.0)
MCH: 25.5 pg — ABNORMAL LOW (ref 26.0–34.0)
MCHC: 31.9 g/dL (ref 30.0–36.0)
MCV: 79.9 fL — ABNORMAL LOW (ref 80.0–100.0)
Platelets: 577 10*3/uL — ABNORMAL HIGH (ref 150–400)
RBC: 4.63 MIL/uL (ref 3.87–5.11)
RDW: 15.1 % (ref 11.5–15.5)
WBC: 10.2 10*3/uL (ref 4.0–10.5)
nRBC: 0 % (ref 0.0–0.2)

## 2020-05-31 LAB — BASIC METABOLIC PANEL
Anion gap: 15 (ref 5–15)
Anion gap: 15 (ref 5–15)
BUN: 56 mg/dL — ABNORMAL HIGH (ref 6–20)
BUN: 57 mg/dL — ABNORMAL HIGH (ref 6–20)
CO2: 15 mmol/L — ABNORMAL LOW (ref 22–32)
CO2: 16 mmol/L — ABNORMAL LOW (ref 22–32)
Calcium: 9.5 mg/dL (ref 8.9–10.3)
Calcium: 9.4 mg/dL (ref 8.9–10.3)
Chloride: 109 mmol/L (ref 98–111)
Chloride: 111 mmol/L (ref 98–111)
Creatinine, Ser: 7.02 mg/dL — ABNORMAL HIGH (ref 0.44–1.00)
Creatinine, Ser: 6.97 mg/dL — ABNORMAL HIGH (ref 0.44–1.00)
GFR, Estimated: 8 mL/min — ABNORMAL LOW (ref >60)
GFR, Estimated: 8 mL/min — ABNORMAL LOW (ref >60)
Glucose, Bld: 195 mg/dL — ABNORMAL HIGH (ref 70–99)
Glucose, Bld: 224 mg/dL — ABNORMAL HIGH (ref 70–99)
Potassium: 4.2 mmol/L (ref 3.5–5.1)
Potassium: 3.9 mmol/L (ref 3.5–5.1)
Sodium: 139 mmol/L (ref 135–145)
Sodium: 142 mmol/L (ref 135–145)

## 2020-05-31 LAB — BLOOD GAS, VENOUS
Acid-base deficit: 15.3 mmol/L — ABNORMAL HIGH (ref 0.0–2.0)
Bicarbonate: 8.8 mmol/L — ABNORMAL LOW (ref 20.0–28.0)
FIO2: 21
O2 Saturation: 98.3 %
Patient temperature: 98.6
pCO2, Ven: <19 mmHg — CL (ref 44.0–60.0)
pH, Ven: 7.38 (ref 7.250–7.430)
pO2, Ven: 153 mmHg — ABNORMAL HIGH (ref 32.0–45.0)

## 2020-05-31 LAB — COMPREHENSIVE METABOLIC PANEL
ALT: 14 U/L (ref 0–44)
AST: 19 U/L (ref 15–41)
Albumin: 3.3 g/dL — ABNORMAL LOW (ref 3.5–5.0)
Alkaline Phosphatase: 67 U/L (ref 38–126)
Anion gap: 18 — ABNORMAL HIGH (ref 5–15)
BUN: 60 mg/dL — ABNORMAL HIGH (ref 6–20)
CO2: 16 mmol/L — ABNORMAL LOW (ref 22–32)
Calcium: 9.9 mg/dL (ref 8.9–10.3)
Chloride: 106 mmol/L (ref 98–111)
Creatinine, Ser: 6.44 mg/dL — ABNORMAL HIGH (ref 0.44–1.00)
GFR, Estimated: 8 mL/min — ABNORMAL LOW (ref >60)
Glucose, Bld: 253 mg/dL — ABNORMAL HIGH (ref 70–99)
Potassium: 4.2 mmol/L (ref 3.5–5.1)
Sodium: 140 mmol/L (ref 135–145)
Total Bilirubin: 0.7 mg/dL (ref 0.3–1.2)
Total Protein: 8 g/dL (ref 6.5–8.1)

## 2020-05-31 LAB — RESP PANEL BY RT-PCR (FLU A&B, COVID) ARPGX2
Influenza A by PCR: NEGATIVE
Influenza B by PCR: NEGATIVE
SARS Coronavirus 2 by RT PCR: NEGATIVE

## 2020-05-31 LAB — LIPASE, BLOOD: Lipase: 35 U/L (ref 11–51)

## 2020-05-31 LAB — CBG MONITORING, ED
Glucose-Capillary: 265 mg/dL — ABNORMAL HIGH (ref 70–99)
Glucose-Capillary: 233 mg/dL — ABNORMAL HIGH (ref 70–99)
Glucose-Capillary: 172 mg/dL — ABNORMAL HIGH (ref 70–99)

## 2020-05-31 LAB — MRSA PCR SCREENING: MRSA by PCR: NEGATIVE

## 2020-05-31 LAB — I-STAT BETA HCG BLOOD, ED (MC, WL, AP ONLY): I-stat hCG, quantitative: <5 m[IU]/mL (ref <5)

## 2020-05-31 LAB — BETA-HYDROXYBUTYRIC ACID
Beta-Hydroxybutyric Acid: 3.08 mmol/L — ABNORMAL HIGH (ref 0.05–0.27)
Beta-Hydroxybutyric Acid: 2.37 mmol/L — ABNORMAL HIGH (ref 0.05–0.27)

## 2020-05-31 LAB — HEMOGLOBIN A1C
Hgb A1c MFr Bld: 7.5 % — ABNORMAL HIGH (ref 4.8–5.6)
Mean Plasma Glucose: 168.55 mg/dL

## 2020-05-31 MED ORDER — POTASSIUM CHLORIDE 10 MEQ/100ML IV SOLN
10.0000 meq | INTRAVENOUS | Status: AC
Start: 2020-05-31 — End: 2020-05-31
  Administered 2020-05-31 (×2): 10 meq via INTRAVENOUS
  Filled 2020-05-31 (×2): qty 100

## 2020-05-31 MED ORDER — NICARDIPINE HCL IN NACL 20-0.86 MG/200ML-% IV SOLN
3.0000 mg/h | INTRAVENOUS | Status: DC
  Administered 2020-05-31: 7.5 mg/h via INTRAVENOUS
  Administered 2020-05-31: 5 mg/h via INTRAVENOUS
  Administered 2020-06-01: 15 mg/h via INTRAVENOUS
  Administered 2020-06-01: 10 mg/h via INTRAVENOUS
  Administered 2020-06-01: 15 mg/h via INTRAVENOUS
  Administered 2020-06-01: 5 mg/h via INTRAVENOUS
  Administered 2020-06-01: 12.5 mg/h via INTRAVENOUS
  Administered 2020-06-01 (×2): 10 mg/h via INTRAVENOUS
  Administered 2020-06-01: 15 mg/h via INTRAVENOUS
  Administered 2020-06-02: 2.5 mg/h via INTRAVENOUS
  Administered 2020-06-02: 3 mg/h via INTRAVENOUS
  Administered 2020-06-02: 5 mg/h via INTRAVENOUS
  Administered 2020-06-02: 7.5 mg/h via INTRAVENOUS
  Administered 2020-06-03: 3 mg/h via INTRAVENOUS
  Filled 2020-05-31 (×23): qty 200

## 2020-05-31 MED ORDER — ONDANSETRON HCL 4 MG/2ML IJ SOLN
4.0000 mg | Freq: Once | INTRAMUSCULAR | Status: AC
  Administered 2020-05-31: 4 mg via INTRAVENOUS
  Filled 2020-05-31: qty 2

## 2020-05-31 MED ORDER — INSULIN REGULAR(HUMAN) IN NACL 100-0.9 UT/100ML-% IV SOLN
INTRAVENOUS | Status: DC
  Administered 2020-05-31: 5 [IU]/h via INTRAVENOUS
  Filled 2020-05-31: qty 100

## 2020-05-31 MED ORDER — METOPROLOL TARTRATE 5 MG/5ML IV SOLN
5.0000 mg | Freq: Four times a day (QID) | INTRAVENOUS | Status: DC
  Administered 2020-05-31: 5 mg via INTRAVENOUS
  Filled 2020-05-31: qty 5

## 2020-05-31 MED ORDER — DEXTROSE IN LACTATED RINGERS 5 % IV SOLN: INTRAVENOUS | Status: DC

## 2020-05-31 MED ORDER — METOCLOPRAMIDE HCL 5 MG/ML IJ SOLN
5.0000 mg | Freq: Three times a day (TID) | INTRAMUSCULAR | Status: DC | PRN
  Administered 2020-05-31 – 2020-06-01 (×2): 5 mg via INTRAVENOUS
  Filled 2020-05-31 (×2): qty 2

## 2020-05-31 MED ORDER — ORAL CARE MOUTH RINSE
15.0000 mL | Freq: Two times a day (BID) | OROMUCOSAL | Status: DC
  Administered 2020-05-31 – 2020-06-09 (×14): 15 mL via OROMUCOSAL

## 2020-05-31 MED ORDER — LABETALOL HCL 5 MG/ML IV SOLN
10.0000 mg | Freq: Once | INTRAVENOUS | Status: AC
  Administered 2020-05-31: 10 mg via INTRAVENOUS
  Filled 2020-05-31: qty 4

## 2020-05-31 MED ORDER — CHLORHEXIDINE GLUCONATE CLOTH 2 % EX PADS
6.0000 | MEDICATED_PAD | Freq: Every day | CUTANEOUS | Status: DC
  Administered 2020-05-31 – 2020-06-09 (×10): 6 via TOPICAL

## 2020-05-31 MED ORDER — LACTATED RINGERS IV SOLN: INTRAVENOUS | Status: DC

## 2020-05-31 MED ORDER — CLONIDINE HCL 0.2 MG/24HR TD PTWK
0.2000 mg | MEDICATED_PATCH | TRANSDERMAL | Status: DC
  Administered 2020-05-31 – 2020-06-07 (×2): 0.2 mg via TRANSDERMAL
  Filled 2020-05-31 (×3): qty 1

## 2020-05-31 MED ORDER — LACTATED RINGERS IV BOLUS
20.0000 mL/kg | Freq: Once | INTRAVENOUS | Status: AC
  Administered 2020-05-31: 1478 mL via INTRAVENOUS

## 2020-05-31 MED ORDER — DEXTROSE 50 % IV SOLN
0.0000 mL | INTRAVENOUS | Status: DC | PRN
Start: 2020-05-31 — End: 2020-06-09

## 2020-05-31 NOTE — ED Notes (Signed)
PA-C notified of pt's BG of 265

## 2020-05-31 NOTE — ED Triage Notes (Signed)
Patient c/o LLQ and vomiting since yesterday. Patient denies fever and diarrhea.

## 2020-05-31 NOTE — ED Provider Notes (Signed)
I provided a substantive portion of the care of this patient.  I personally performed the entirety of the medical decision making for this encounter.  EKG Interpretation  Date/Time:  Saturday May 31 2020 12:53:04 EDT Ventricular Rate:  92 PR Interval:  180 QRS Duration: 109 QT Interval:  382 QTC Calculation: 473 R Axis:   76 Text Interpretation: Sinus rhythm Confirmed by Lacretia Leigh ([id number]) on 05/31/2020 1:38:70 PM  30 year old female presents with vomiting abdominal discomfort.  Has evidence of DKA.  Patient noted to be hypertensive and treated with IV labetalol.  Patient started on insulin drip as well as fluids and will be admitted to the hospitalist   Lacretia Leigh, MD 05/31/20 1407

## 2020-05-31 NOTE — ED Provider Notes (Signed)
Barbara Donovan   CSN: [id number] Arrival date & time: 05/31/20  1208     History Chief Complaint  Patient presents with  . Emesis  . Abdominal Pain    Barbara Donovan is a 30 y.o. female.  HPI 30 year old female with history of asthma, type 1 diabetes, depression, CKD stage V, VSD presents to the ER with complaints of nausea, vomiting, generalized abdominal/left lower quadrant pain which has been ongoing since yesterday.  Patient on arrival is breathing rapidly, answering questions very briefly.  She reports compliance with insulin but states she has not taken any today.  Denies any fevers or chills.  No dysuria, hematuria, no pelvic pain, no bleeding    Past Medical History:  Diagnosis Date  . Asthma    as a child, no problems as an adult, no inhaler  . Cataract    NS OU  . Chronic hypertension during pregnancy, antepartum 08/19/2017  . Dehydration 01/28/2018  . Depression during pregnancy, antepartum 07/07/2017   6/20: Short trial of zoloft previously, reports didn't help much but also didn't give it a chance Discussed r/b/a SSRIs in pregnancy, agrees to try Zoloft again, rx sent No SI/HI/red flags  . Diabetes ([place])    TYPE I  . Diabetic retinopathy ([place]) 06/09/2017   07/2017 with bilateral severe diabetic non-proliferative retinopathy with macular edema.  Marland Kitchen HTN (hypertension)   . Hypertensive retinopathy    OU  . Hypokalemia 01/22/2018  . Hypomagnesemia 01/28/2018  . Intractable nausea and vomiting 01/22/2018  . Intrauterine growth restriction (IUGR) affecting care of mother 12/22/2017  . Morbid obesity ([place])   . 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 Proteinuria due to type 1 diabetes mellitus ([place]) 11/02/2017   Baseline Pr: Cr 10.23  . Severe hyperemesis gravidarum 10/30/2017  . Type I diabetes mellitus ([place]) 07/07/2017   Current Diabetic Medications:  Insulin  '[x]'$  Aspirin 81 mg daily after 12 weeks (? A2/B GDM)  Required  Referrals for A1GDM or A2GDM: '[x]'$  Diabetes Education and Testing Supplies '[x]'$  Nutrition Cousult  For A2/B GDM or higher classes of DM '[x]'$  Diabetes Education and Testing Supplies '[x]'$  Nutrition Counsult '[x]'$  Fetal ECHO after 20 weeks  '[x]'$  Eye exam for retina evaluation - severe retinopathy 7/19  Base  . Ventricular septal defect (VSD) of fetus in singleton pregnancy, antepartum 09/30/2017   May go to newborn nursery per Dr. Lenard Simmer Echo prior to discharge    Patient Active Problem List   Diagnosis Date Noted  . Nausea & vomiting   . Diabetic gastroparesis ([place]) 03/04/2020  . Anemia due to stage 5 chronic kidney disease ([place]) 03/04/2020  . Diabetic ketoacidosis without coma associated with type 1 diabetes mellitus ([place]) 02/09/2020  . Hypothermia 01/24/2020  . DKA (diabetic ketoacidosis) ([place]) 09/14/2019  . COVID-19 virus infection 09/14/2019  . SOB (shortness of breath) 03/27/2018  . Hypomagnesemia 01/28/2018  . Dehydration 01/28/2018  . Intractable nausea and vomiting 01/22/2018  . Hypertensive urgency 01/22/2018  . Hypokalemia 01/22/2018  . Nephropathy, diabetic ([place]) 12/29/2017  . Proteinuria due to type 1 diabetes mellitus ([place]) 11/02/2017  . Chronic hypertension 08/21/2017  . Uncontrolled type 1 diabetes mellitus with stage 5 chronic kidney disease not on chronic dialysis, with long-term current use of insulin ([place]) 07/07/2017  . Diabetic retinopathy ([place]) 06/09/2017  . Acute depression 11/02/2014  . Asthma 10/30/2013    Past Surgical History:  Procedure Laterality Date  . [place] VITRECTOMY WITH 20 GAUGE MVR PORT  FOR MACULAR HOLE Left 07/20/2018   Procedure: 1 GAUGE PARS PLANA VITRECTOMY LEFT EYE;  Surgeon: Bernarda Caffey, MD;  Location: [place];  Service: Ophthalmology;  Laterality: Left;  . CESAREAN SECTION N/A 12/26/2017   Procedure: CESAREAN SECTION;  Surgeon: Osborne Oman, MD;  Location: [facility];  Service: Obstetrics;  Laterality: N/A;  . EYE EXAMINATION  UNDER ANESTHESIA Right 07/20/2018   Procedure: Eye Exam Under Anesthesia RIGHT EYE;  Surgeon: Bernarda Caffey, MD;  Location: [facility];  Service: Ophthalmology;  Laterality: Right;  . EYE SURGERY Left 07/2018  . GAS INSERTION Left 07/19/2019   Procedure: INSERTION OF GAS;  Surgeon: Bernarda Caffey, MD;  Location: [facility];  Service: Ophthalmology;  Laterality: Left;  SF6  . INJECTION OF SILICONE OIL Left 99991111   Procedure: Injection Of Silicone Oil LEFT EYE;  Surgeon: Bernarda Caffey, MD;  Location: [facility];  Service: Ophthalmology;  Laterality: Left;  . LASER PHOTO ABLATION Right 07/20/2018   Procedure: Laser Photo Ablation RIGHT EYE;  Surgeon: Bernarda Caffey, MD;  Location: [facility];  Service: Ophthalmology;  Laterality: Right;  . MEMBRANE PEEL Left 07/20/2018   Procedure: Membrane Peel LEFT EYE;  Surgeon: Bernarda Caffey, MD;  Location: [facility];  Service: Ophthalmology;  Laterality: Left;  . MEMBRANE PEEL Left 07/19/2019   Procedure: MEMBRANE PEEL;  Surgeon: Bernarda Caffey, MD;  Location: [facility];  Service: Ophthalmology;  Laterality: Left;  . MITOMYCIN C APPLICATION Bilateral 99991111   Procedure: Avastin Application;  Surgeon: Bernarda Caffey, MD;  Location: [place];  Service: Ophthalmology;  Laterality: Bilateral;  . PHOTOCOAGULATION WITH LASER Left 07/20/2018   Procedure: Photocoagulation With Laser LEFT EYE;  Surgeon: Bernarda Caffey, MD;  Location: [facility];  Service: Ophthalmology;  Laterality: Left;  . PHOTOCOAGULATION WITH LASER Left 07/19/2019   Procedure: PHOTOCOAGULATION WITH LASER;  Surgeon: Bernarda Caffey, MD;  Location: [facility];  Service: Ophthalmology;  Laterality: Left;  . RETINAL DETACHMENT SURGERY Left 07/20/2018   Dr. Bernarda Caffey  . SILICON OIL REMOVAL Left 07/19/2019   Procedure: 25g PARS PLANA VITRECTOMY WITH SILICON OIL REMOVAL;  Surgeon: Bernarda Caffey, MD;  Location: [facility];  Service: Ophthalmology;  Laterality: Left;  . WISDOM TOOTH EXTRACTION       OB History    Gravida  1   Para  1   Term      Preterm   1   AB      Living  1     SAB      IAB      Ectopic      Multiple  0   Live Births  1           Family History  Problem Relation Age of Onset  . Diabetes Mother   . Aneurysm Mother 40  . Seizures Mother   . Diabetes Father   . Cataracts Father   . COPD Father   . Heart attack Father   . Heart disease Father   . Healthy Sister   . Healthy Daughter   . Stroke Maternal Grandfather   . Amblyopia Neg Hx   . Blindness Neg Hx   . Glaucoma Neg Hx   . Macular degeneration Neg Hx   . Retinal detachment Neg Hx   . Strabismus Neg Hx   . Retinitis pigmentosa Neg Hx   . Colon cancer Neg Hx   . Stomach cancer Neg Hx   . Esophageal cancer Neg Hx   . Pancreatic cancer Neg Hx   .  Liver disease Neg Hx     Social History   Tobacco Use  . Smoking status: Never Smoker  . Smokeless tobacco: Never Used  Vaping Use  . Vaping Use: Never used  Substance Use Topics  . Alcohol use: Not Currently    Comment: SOCIALLY  . Drug use: Never    Home Medications Prior to Admission medications   Medication Sig Start Date End Date Taking? Authorizing Provider  amLODipine (NORVASC) 10 MG tablet Take 1 tablet (10 mg total) by mouth daily. 03/09/20   Mercy Riding, MD  carvedilol (COREG) 25 MG tablet Take 1 tablet (25 mg total) by mouth 2 (two) times daily. 03/07/19   Imogene Burn, PA-C  cloNIDine (CATAPRES - DOSED IN MG/24 HR) 0.2 mg/24hr patch Place 1 patch (0.2 mg total) onto the skin once a week. 04/26/20   Eugenie Filler, MD  Continuous Blood Gluc Sensor MISC 1 each by Does not apply route as directed. Use as directed every 14 days. May dispense FreeStyle [employer] or similar. 01/27/20   Antonieta Pert, MD  hydrALAZINE (APRESOLINE) 25 MG tablet Take 3 tablets (75 mg total) by mouth 3 (three) times daily. 03/08/20   Mercy Riding, MD  insulin glargine (LANTUS SOLOSTAR) 100 UNIT/ML Solostar Pen Inject 16 Units into the skin every morning. 04/25/20   Renato Shin, MD  insulin  lispro (HUMALOG KWIKPEN) 100 UNIT/ML KwikPen Inject 2 Units into the skin 3 (three) times daily with meals. 04/25/20   Renato Shin, MD  latanoprost (XALATAN) 0.005 % ophthalmic solution Place 1 drop into the left eye at bedtime. 02/28/20   Bernarda Caffey, MD  metoCLOPramide (REGLAN) 5 MG tablet Take 1 tablet (5 mg total) by mouth every 8 (eight) hours as needed for nausea or vomiting. 02/10/20   Mercy Riding, MD  multivitamin (RENA-VIT) TABS tablet Take 1 tablet by mouth at bedtime. 03/08/20   Mercy Riding, MD  pantoprazole (PROTONIX) 40 MG tablet Take 1 tablet (40 mg total) by mouth daily. 01/31/20   Ladene Artist, MD  scopolamine (TRANSDERM-SCOP) 1 MG/3DAYS Place 1 patch (1.5 mg total) onto the skin every 3 (three) days. 04/13/20   Georgette Shell, MD  sertraline (ZOLOFT) 100 MG tablet Take 100 mg by mouth every morning. 02/15/20   [provider]  sodium bicarbonate 650 MG tablet Take 1 tablet (650 mg total) by mouth 3 (three) times daily. 04/22/20   Eugenie Filler, MD  traZODone (DESYREL) 50 MG tablet Take 50 mg by mouth at bedtime. 02/16/20   [provider]    Allergies    Patient has no known allergies.  Review of Systems   Review of Systems  Constitutional: Negative for chills and fever.  HENT: Negative for ear pain and sore throat.   Eyes: Negative for pain and visual disturbance.  Respiratory: Negative for cough and shortness of breath.   Cardiovascular: Negative for chest pain and palpitations.  Gastrointestinal: Positive for abdominal pain, nausea and vomiting.  Genitourinary: Negative for dysuria and hematuria.  Musculoskeletal: Negative for arthralgias and back pain.  Skin: Negative for color change and rash.  Neurological: Negative for seizures and syncope.  All other systems reviewed and are negative.   Physical Exam Updated Vital Signs BP (!) 192/103   Pulse 97   Temp 98.3 F (36.8 C) (Oral)   Resp 13   Ht '5\' 6"'$  (1.676 m)   Wt 73.9 kg    SpO2 99%  BMI 26.31 kg/m   Physical Exam Vitals and nursing Donovan reviewed.  Constitutional:      General: She is not in acute distress.    Appearance: She is well-developed.  HENT:     Head: Normocephalic and atraumatic.  Eyes:     Conjunctiva/sclera: Conjunctivae normal.  Cardiovascular:     Rate and Rhythm: Normal rate and regular rhythm.     Heart sounds: No murmur heard.   Pulmonary:     Effort: Tachypnea and accessory muscle usage present. No respiratory distress.     Breath sounds: Normal breath sounds.  Abdominal:     Palpations: Abdomen is soft.     Tenderness: There is abdominal tenderness (generalized).  Musculoskeletal:     Cervical back: Neck supple.  Skin:    General: Skin is warm and dry.  Neurological:     Mental Status: She is alert.     ED Results / Procedures / Treatments   Labs (all labs ordered are listed, but only abnormal results are displayed) Labs Reviewed  COMPREHENSIVE METABOLIC PANEL - Abnormal; Notable for the following components:      Result Value   CO2 16 (*)    Glucose, Bld 253 (*)    BUN 60 (*)    Creatinine, Ser 6.44 (*)    Albumin 3.3 (*)    GFR, Estimated 8 (*)    Anion gap 18 (*)    All other components within normal limits  CBC - Abnormal; Notable for the following components:   Hemoglobin 11.8 (*)    MCV 79.9 (*)    MCH 25.5 (*)    Platelets 577 (*)    All other components within normal limits  BLOOD GAS, VENOUS - Abnormal; Notable for the following components:   pCO2, Ven <19.0 (*)    pO2, Ven 153.0 (*)    Bicarbonate 8.8 (*)    Acid-base deficit 15.3 (*)    All other components within normal limits  BETA-HYDROXYBUTYRIC ACID - Abnormal; Notable for the following components:   Beta-Hydroxybutyric Acid 3.08 (*)    All other components within normal limits  CBG MONITORING, ED - Abnormal; Notable for the following components:   Glucose-Capillary 265 (*)    All other components within normal limits  CBG MONITORING,  ED - Abnormal; Notable for the following components:   Glucose-Capillary 233 (*)    All other components within normal limits  CBG MONITORING, ED - Abnormal; Notable for the following components:   Glucose-Capillary 172 (*)    All other components within normal limits  RESP PANEL BY RT-PCR (FLU A&B, COVID) ARPGX2  LIPASE, BLOOD  URINALYSIS, ROUTINE W REFLEX MICROSCOPIC  I-STAT BETA HCG BLOOD, ED ([facility], [facility], [facility] ONLY)    EKG EKG Interpretation  Date/Time:  Saturday May 31 2020 12:53:04 EDT Ventricular Rate:  92 PR Interval:  180 QRS Duration: 109 QT Interval:  382 QTC Calculation: 473 R Axis:   76 Text Interpretation: Sinus rhythm Confirmed by Lacretia Leigh ([id number]) on 05/31/2020 1:57:18 PM   Radiology No results found.  Procedures Procedures   Medications Ordered in ED Medications  insulin regular, human (MYXREDLIN) 100 units/ 100 mL infusion (5 Units/hr Intravenous New Bag/Given 05/31/20 1357)  lactated ringers infusion ( Intravenous New Bag/Given 05/31/20 1523)  dextrose 5 % in lactated ringers infusion ( Intravenous New Bag/Given 05/31/20 1522)  dextrose 50 % solution 0-50 mL (has no administration in time range)  potassium chloride 10 mEq in 100 mL IVPB (10 mEq Intravenous  New Bag/Given 05/31/20 1511)  metoprolol tartrate (LOPRESSOR) injection 5 mg (has no administration in time range)  cloNIDine (CATAPRES - Dosed in mg/24 hr) patch 0.2 mg (has no administration in time range)  metoCLOPramide (REGLAN) injection 5 mg (has no administration in time range)  lactated ringers bolus 1,478 mL (0 mLs Intravenous Stopped 05/31/20 1526)  ondansetron (ZOFRAN) injection 4 mg (4 mg Intravenous Given 05/31/20 1337)  labetalol (NORMODYNE) injection 10 mg (10 mg Intravenous Given 05/31/20 1419)    ED Course  I have reviewed the triage vital signs and the nursing notes.  Pertinent labs & imaging results that were available during my care of the patient were reviewed by me and considered in my  medical decision making (see chart for details).    MDM Rules/Calculators/A&P                         30 year old female presents to the ER with complaints of nausea, vomiting, abdominal pain which started yesterday.  On arrival, she is tachypneic, speaking in short sentences.  No evidence of hypoxia, hypertensive with a blood pressure 125/120, tachycardic at 110.  She has some mild generalized tenderness on exam.  CBG of 256 on arrival, her CMP shows a CO2 of 16, creatinine increased at 6.44 from her baseline at 5.5.  Anion gap of 18, VBG with PCO2 less than 19, normal pH.  Bicarb of 8.8.  CBC without leukocytosis.  EKG with sinus tach.  Patient reports compliance with insulin but did not take any today.  DKA order set initiated, and until started.  She was given 10 mg of labetalol via IV for her hypertension.  Consulted Dr. Marylyn Ishihara with the hospitalist team who will admit the patient for further evaluation and treatment  Stable for admission.  Final Clinical Impression(s) / ED Diagnoses Final diagnoses:  Diabetic ketoacidosis without coma associated with type 1 diabetes mellitus [facility])    Rx / DC Orders ED Discharge Orders    None       Lyndel Safe 05/31/20 1529    Lacretia Leigh, MD 06/01/20 [phone]

## 2020-05-31 NOTE — ED Notes (Signed)
Verified with pharmacy that potassium and lactated ringers are compatible IV.

## 2020-05-31 NOTE — H&P (Addendum)
History and Physical    Barbara Donovan [id number] DOB: February 21, 1990 DOA: 05/31/2020  PCP: Fanny Bien, MD  Patient coming from: Home  Chief Complaint: N/V  HPI: Barbara Donovan is a 30 y.o. female with medical history significant of HTN, CKD5, DMt1. Presenting with N/V. She reports that she was in her normal state of health until yesterday morning. She began to feel nauseous shortly after taking her morning meds. She vomited several times during the morning. She tried eating a sandwich later in the day, but was unable to keep that down either. Her N/V continued throughout the evening and into this morning. She felt weak, jittery, and had some mild crampy LLQ ab pain. So she decided to come to the ED for help. She denies any other aggravating or alleviating factors.    ED Course: Found to have an elevated gap and elevated sugars. Started on Endotool. Found to be hypertensive. Given IV labetolol. TRH called for admission.   Review of Systems:  Denies CP, palpitations, diarrhea, fever, sick contacts.   Review of systems is otherwise negative for all not mentioned in HPI.   PMHx Past Medical History:  Diagnosis Date  . Asthma    as a child, no problems as an adult, no inhaler  . Cataract    NS OU  . Chronic hypertension during pregnancy, antepartum 08/19/2017  . Dehydration 01/28/2018  . Depression during pregnancy, antepartum 07/07/2017   6/20: Short trial of zoloft previously, reports didn't help much but also didn't give it a chance Discussed r/b/a SSRIs in pregnancy, agrees to try Zoloft again, rx sent No SI/HI/red flags  . Diabetes ([place])    TYPE I  . 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  Marland Kitchen HTN (hypertension)   . Hypertensive retinopathy    OU  . Hypokalemia 01/22/2018  . Hypomagnesemia 01/28/2018  . Intractable nausea and vomiting 01/22/2018  . Intrauterine growth restriction (IUGR)  affecting care of mother 12/22/2017  . Morbid obesity ([place])   . 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 Proteinuria due to type 1 diabetes mellitus ([place]) 11/02/2017   Baseline Pr: Cr 10.23  . Severe hyperemesis gravidarum 10/30/2017  . Type I diabetes mellitus ([place]) 07/07/2017   Current Diabetic Medications:  Insulin  '[x]'$  Aspirin 81 mg daily after 12 weeks (? A2/B GDM)  Required Referrals for A1GDM or A2GDM: '[x]'$  Diabetes Education and Testing Supplies '[x]'$  Nutrition Cousult  For A2/B GDM or higher classes of DM '[x]'$  Diabetes Education and Testing Supplies '[x]'$  Nutrition Counsult '[x]'$  Fetal ECHO after 20 weeks  '[x]'$  Eye exam for retina evaluation - severe retinopathy 7/19  Base  . Ventricular septal defect (VSD) of fetus in singleton pregnancy, antepartum 09/30/2017   May go to newborn nursery per Dr. Lenard Simmer Echo prior to discharge    PSHx Past Surgical History:  Procedure Laterality Date  . [place] VITRECTOMY WITH 20 GAUGE MVR PORT FOR MACULAR HOLE Left 07/20/2018   Procedure: 25 GAUGE PARS PLANA VITRECTOMY LEFT EYE;  Surgeon: Bernarda Caffey, MD;  Location: [place];  Service: Ophthalmology;  Laterality: Left;  . CESAREAN SECTION N/A 12/26/2017   Procedure: CESAREAN SECTION;  Surgeon: Osborne Oman, MD;  Location: [place];  Service: Obstetrics;  Laterality: N/A;  . EYE EXAMINATION UNDER ANESTHESIA Right 07/20/2018   Procedure: Eye Exam Under Anesthesia RIGHT EYE;  Surgeon: Bernarda Caffey, MD;  Location: [place];  Service: Ophthalmology;  Laterality: Right;  . EYE SURGERY Left 07/2018  . GAS INSERTION Left 07/19/2019   Procedure: INSERTION OF GAS;  Surgeon: Bernarda Caffey, MD;  Location: [place];  Service: Ophthalmology;  Laterality: Left;  SF6  . INJECTION OF SILICONE OIL Left 99991111   Procedure: Injection Of Silicone Oil LEFT EYE;  Surgeon: Bernarda Caffey, MD;  Location: [place];  Service: Ophthalmology;  Laterality: Left;  . LASER PHOTO ABLATION Right 07/20/2018   Procedure: Laser Photo  Ablation RIGHT EYE;  Surgeon: Bernarda Caffey, MD;  Location: [place];  Service: Ophthalmology;  Laterality: Right;  . MEMBRANE PEEL Left 07/20/2018   Procedure: Membrane Peel LEFT EYE;  Surgeon: Bernarda Caffey, MD;  Location: [place];  Service: Ophthalmology;  Laterality: Left;  . MEMBRANE PEEL Left 07/19/2019   Procedure: MEMBRANE PEEL;  Surgeon: Bernarda Caffey, MD;  Location: [place];  Service: Ophthalmology;  Laterality: Left;  . MITOMYCIN C APPLICATION Bilateral 99991111   Procedure: Avastin Application;  Surgeon: Bernarda Caffey, MD;  Location: [place];  Service: Ophthalmology;  Laterality: Bilateral;  . PHOTOCOAGULATION WITH LASER Left 07/20/2018   Procedure: Photocoagulation With Laser LEFT EYE;  Surgeon: Bernarda Caffey, MD;  Location: [place];  Service: Ophthalmology;  Laterality: Left;  . PHOTOCOAGULATION WITH LASER Left 07/19/2019   Procedure: PHOTOCOAGULATION WITH LASER;  Surgeon: Bernarda Caffey, MD;  Location: [place];  Service: Ophthalmology;  Laterality: Left;  . RETINAL DETACHMENT SURGERY Left 07/20/2018   Dr. Bernarda Caffey  . SILICON OIL REMOVAL Left 07/19/2019   Procedure: 25g PARS PLANA VITRECTOMY WITH SILICON OIL REMOVAL;  Surgeon: Bernarda Caffey, MD;  Location: [place];  Service: Ophthalmology;  Laterality: Left;  . WISDOM TOOTH EXTRACTION      SocHx  reports that she has never smoked. She has never used smokeless tobacco. She reports previous alcohol use. She reports that she does not use drugs.  No Known Allergies  FamHx Family History  Problem Relation Age of Onset  . Diabetes Mother   . Aneurysm Mother 31  . Seizures Mother   . Diabetes Father   . Cataracts Father   . COPD Father   . Heart attack Father   . Heart disease Father   . Healthy Sister   . Healthy Daughter   . Stroke Maternal Grandfather   . Amblyopia Neg Hx   . Blindness Neg Hx   . Glaucoma Neg Hx   . Macular degeneration Neg Hx   . Retinal detachment Neg Hx   . Strabismus Neg Hx   . Retinitis pigmentosa Neg Hx   .  Colon cancer Neg Hx   . Stomach cancer Neg Hx   . Esophageal cancer Neg Hx   . Pancreatic cancer Neg Hx   . Liver disease Neg Hx     Prior to Admission medications   Medication Sig Start Date End Date Taking? Authorizing Provider  amLODipine (NORVASC) 10 MG tablet Take 1 tablet (10 mg total) by mouth daily. 03/09/20   Mercy Riding, MD  carvedilol (COREG) 25 MG tablet Take 1 tablet (25 mg total) by mouth 2 (two) times daily. 03/07/19   Imogene Burn, PA-C  cloNIDine (CATAPRES - DOSED IN MG/24 HR) 0.2 mg/24hr patch Place 1 patch (0.2 mg total) onto the skin once a week. 04/26/20   Eugenie Filler, MD  Continuous Blood Gluc Sensor MISC 1 each by Does not apply route as directed. Use as directed every 14 days. May dispense FreeStyle [employer] or similar. 01/27/20  Antonieta Pert, MD  hydrALAZINE (APRESOLINE) 25 MG tablet Take 3 tablets (75 mg total) by mouth 3 (three) times daily. 03/08/20   Mercy Riding, MD  insulin glargine (LANTUS SOLOSTAR) 100 UNIT/ML Solostar Pen Inject 16 Units into the skin every morning. 04/25/20   Renato Shin, MD  insulin lispro (HUMALOG KWIKPEN) 100 UNIT/ML KwikPen Inject 2 Units into the skin 3 (three) times daily with meals. 04/25/20   Renato Shin, MD  latanoprost (XALATAN) 0.005 % ophthalmic solution Place 1 drop into the left eye at bedtime. 02/28/20   Bernarda Caffey, MD  metoCLOPramide (REGLAN) 5 MG tablet Take 1 tablet (5 mg total) by mouth every 8 (eight) hours as needed for nausea or vomiting. 02/10/20   Mercy Riding, MD  multivitamin (RENA-VIT) TABS tablet Take 1 tablet by mouth at bedtime. 03/08/20   Mercy Riding, MD  pantoprazole (PROTONIX) 40 MG tablet Take 1 tablet (40 mg total) by mouth daily. 01/31/20   Ladene Artist, MD  scopolamine (TRANSDERM-SCOP) 1 MG/3DAYS Place 1 patch (1.5 mg total) onto the skin every 3 (three) days. 04/13/20   Georgette Shell, MD  sertraline (ZOLOFT) 100 MG tablet Take 100 mg by mouth every morning. 02/15/20   [provider]  sodium bicarbonate 650 MG tablet Take 1 tablet (650 mg total) by mouth 3 (three) times daily. 04/22/20   Eugenie Filler, MD  traZODone (DESYREL) 50 MG tablet Take 50 mg by mouth at bedtime. 02/16/20   [provider]    Physical Exam: Vitals:   05/31/20 1220 05/31/20 1242 05/31/20 1330 05/31/20 1415  BP:  (!) 200/100 (!) 197/107 (!) 192/103  Pulse:  (!) 105 (!) 101 97  Resp:  (!) '24 18 13  '$ Temp:  98.3 F (36.8 C)    TempSrc:  Oral    SpO2:  100% 99% 99%  Weight: 73.9 kg     Height: '5\' 6"'$  (1.676 m)       General: 30 y.o. female resting in bed in NAD Eyes: PERRL, normal sclera ENMT: Nares patent w/o discharge, orophaynx clear, dentition normal, ears w/o discharge/lesions/ulcers Neck: Supple, trachea midline Cardiovascular: tachy, +S1, S2, no m/g/r, equal pulses throughout Respiratory: CTABL, no w/r/r, normal WOB GI: BS+, NDNT, no masses noted, no organomegaly noted MSK: No e/c/c Skin: No rashes, bruises, ulcerations noted Neuro: A&O x 3, no focal deficits Psyc: Appropriate interaction and affect, calm/cooperative  Labs on Admission: I have personally reviewed following labs and imaging studies  CBC: Recent Labs  Lab 05/31/20 1223  WBC 10.2  HGB 11.8*  HCT 37.0  MCV 79.9*  PLT 123XX123*   Basic Metabolic Panel: Recent Labs  Lab 05/31/20 1223  NA 140  K 4.2  CL 106  CO2 16*  GLUCOSE 253*  BUN 60*  CREATININE 6.44*  CALCIUM 9.9   GFR: Estimated Creatinine Clearance: 13.2 mL/min (A) (by C-G formula based on SCr of 6.44 mg/dL (H)). Liver Function Tests: Recent Labs  Lab 05/31/20 1223  AST 19  ALT 14  ALKPHOS 67  BILITOT 0.7  PROT 8.0  ALBUMIN 3.3*   Recent Labs  Lab 05/31/20 1223  LIPASE 35   No results for input(s): AMMONIA in the last 168 hours. Coagulation Profile: No results for input(s): INR, PROTIME in the last 168 hours. Cardiac Enzymes: No results for input(s): CKTOTAL, CKMB, CKMBINDEX, TROPONINI in the last 168  hours. BNP (last 3 results) No results for input(s): PROBNP in the last 8760 hours. HbA1C: No  results for input(s): HGBA1C in the last 72 hours. CBG: Recent Labs  Lab 05/31/20 1249 05/31/20 1429  GLUCAP 265* 233*   Lipid Profile: No results for input(s): CHOL, HDL, LDLCALC, TRIG, CHOLHDL, LDLDIRECT in the last 72 hours. Thyroid Function Tests: No results for input(s): TSH, T4TOTAL, FREET4, T3FREE, THYROIDAB in the last 72 hours. Anemia Panel: No results for input(s): VITAMINB12, FOLATE, FERRITIN, TIBC, IRON, RETICCTPCT in the last 72 hours. Urine analysis:    Component Value Date/Time   COLORURINE YELLOW 04/20/2020 0900   APPEARANCEUR CLEAR 04/20/2020 0900   LABSPEC 1.014 04/20/2020 0900   PHURINE 6.0 04/20/2020 0900   GLUCOSEU >=500 (A) 04/20/2020 0900   HGBUR SMALL (A) 04/20/2020 0900   BILIRUBINUR NEGATIVE 04/20/2020 0900   KETONESUR 20 (A) 04/20/2020 0900   PROTEINUR >=300 (A) 04/20/2020 0900   UROBILINOGEN 0.2 08/14/2019 1051   NITRITE NEGATIVE 04/20/2020 0900   LEUKOCYTESUR NEGATIVE 04/20/2020 0900    Radiological Exams on Admission: No results found.  EKG: Independently reviewed. Sinus, no st elevations  Assessment/Plan Early DKA DMt1 DM gastroparesis     - admit to inpatient, SDU     - she has an open gap and metabolic acidosis, but her pH is 7.38     - continue endotool (insulin gtt, fluids), check A1c     - can have non-caloric fluids     - reglan IV     - q4h BMP  HTN     - takes coreg, clonidine, and hydralazine at home     - currently not able to keep anything down, so will switch coreg to IV metoprolol scheduled; will switch PO clonidine to patch; will switch PO hydralazine to IV hydralazine PRN      - if not able to control her with this regimen; will need to swtich to a drip      - UPDATE: BP is still high. Will start cardene gtt  CKD5     - she is not on dialysis yet     - watch nephrotoxins  DVT prophylaxis: lovenox  Code Status: FULL   Family Communication: None at bedside  Consults called: None  Status is: Inpatient  Remains inpatient appropriate because:Inpatient level of care appropriate due to severity of illness   Dispo: The patient is from: Home              Anticipated d/c is to: Home              Patient currently is not medically stable to d/c.   Difficult to place patient No  Time spent coordinating admission: 75 minutes  [facility]  If 7PM-7AM, please contact night-coverage [email or web address]  05/31/2020, 3:03 PM

## 2020-06-01 ENCOUNTER — Encounter (HOSPITAL_COMMUNITY): Payer: Self-pay | Admitting: Internal Medicine

## 2020-06-01 ENCOUNTER — Inpatient Hospital Stay (HOSPITAL_COMMUNITY): Payer: 59

## 2020-06-01 DIAGNOSIS — E1010 Type 1 diabetes mellitus with ketoacidosis without coma: Secondary | ICD-10-CM | POA: Diagnosis not present

## 2020-06-01 DIAGNOSIS — I10 Essential (primary) hypertension: Secondary | ICD-10-CM | POA: Diagnosis not present

## 2020-06-01 DIAGNOSIS — N189 Chronic kidney disease, unspecified: Secondary | ICD-10-CM | POA: Diagnosis present

## 2020-06-01 DIAGNOSIS — F32A Depression, unspecified: Secondary | ICD-10-CM

## 2020-06-01 DIAGNOSIS — I160 Hypertensive urgency: Secondary | ICD-10-CM

## 2020-06-01 DIAGNOSIS — N179 Acute kidney failure, unspecified: Secondary | ICD-10-CM | POA: Diagnosis present

## 2020-06-01 DIAGNOSIS — E860 Dehydration: Secondary | ICD-10-CM

## 2020-06-01 DIAGNOSIS — N185 Chronic kidney disease, stage 5: Secondary | ICD-10-CM

## 2020-06-01 LAB — GLUCOSE, CAPILLARY
Glucose-Capillary: 137 mg/dL — ABNORMAL HIGH (ref 70–99)
Glucose-Capillary: 137 mg/dL — ABNORMAL HIGH (ref 70–99)
Glucose-Capillary: 149 mg/dL — ABNORMAL HIGH (ref 70–99)
Glucose-Capillary: 150 mg/dL — ABNORMAL HIGH (ref 70–99)
Glucose-Capillary: 163 mg/dL — ABNORMAL HIGH (ref 70–99)
Glucose-Capillary: 164 mg/dL — ABNORMAL HIGH (ref 70–99)
Glucose-Capillary: 167 mg/dL — ABNORMAL HIGH (ref 70–99)
Glucose-Capillary: 169 mg/dL — ABNORMAL HIGH (ref 70–99)
Glucose-Capillary: 178 mg/dL — ABNORMAL HIGH (ref 70–99)
Glucose-Capillary: 178 mg/dL — ABNORMAL HIGH (ref 70–99)
Glucose-Capillary: 178 mg/dL — ABNORMAL HIGH (ref 70–99)
Glucose-Capillary: 187 mg/dL — ABNORMAL HIGH (ref 70–99)
Glucose-Capillary: 263 mg/dL — ABNORMAL HIGH (ref 70–99)

## 2020-06-01 LAB — BASIC METABOLIC PANEL
Anion gap: 13 (ref 5–15)
Anion gap: 12 (ref 5–15)
Anion gap: 10 (ref 5–15)
BUN: 59 mg/dL — ABNORMAL HIGH (ref 6–20)
BUN: 57 mg/dL — ABNORMAL HIGH (ref 6–20)
BUN: 55 mg/dL — ABNORMAL HIGH (ref 6–20)
CO2: 17 mmol/L — ABNORMAL LOW (ref 22–32)
CO2: 18 mmol/L — ABNORMAL LOW (ref 22–32)
CO2: 17 mmol/L — ABNORMAL LOW (ref 22–32)
Calcium: 9.6 mg/dL (ref 8.9–10.3)
Calcium: 9.5 mg/dL (ref 8.9–10.3)
Calcium: 9.1 mg/dL (ref 8.9–10.3)
Chloride: 114 mmol/L — ABNORMAL HIGH (ref 98–111)
Chloride: 116 mmol/L — ABNORMAL HIGH (ref 98–111)
Chloride: 117 mmol/L — ABNORMAL HIGH (ref 98–111)
Creatinine, Ser: 7.13 mg/dL — ABNORMAL HIGH (ref 0.44–1.00)
Creatinine, Ser: 7.23 mg/dL — ABNORMAL HIGH (ref 0.44–1.00)
Creatinine, Ser: 7.05 mg/dL — ABNORMAL HIGH (ref 0.44–1.00)
GFR, Estimated: 7 mL/min — ABNORMAL LOW (ref >60)
GFR, Estimated: 7 mL/min — ABNORMAL LOW (ref >60)
GFR, Estimated: 8 mL/min — ABNORMAL LOW (ref >60)
Glucose, Bld: 180 mg/dL — ABNORMAL HIGH (ref 70–99)
Glucose, Bld: 167 mg/dL — ABNORMAL HIGH (ref 70–99)
Glucose, Bld: 179 mg/dL — ABNORMAL HIGH (ref 70–99)
Potassium: 3.6 mmol/L (ref 3.5–5.1)
Potassium: 3.8 mmol/L (ref 3.5–5.1)
Potassium: 3.6 mmol/L (ref 3.5–5.1)
Sodium: 144 mmol/L (ref 135–145)
Sodium: 146 mmol/L — ABNORMAL HIGH (ref 135–145)
Sodium: 144 mmol/L (ref 135–145)

## 2020-06-01 LAB — CBC WITH DIFFERENTIAL/PLATELET
Abs Immature Granulocytes: 0.06 10*3/uL (ref 0.00–0.07)
Basophils Absolute: 0 10*3/uL (ref 0.0–0.1)
Basophils Relative: 0 %
Eosinophils Absolute: 0 10*3/uL (ref 0.0–0.5)
Eosinophils Relative: 0 %
HCT: 31.9 % — ABNORMAL LOW (ref 36.0–46.0)
Hemoglobin: 10 g/dL — ABNORMAL LOW (ref 12.0–15.0)
Immature Granulocytes: 1 %
Lymphocytes Relative: 15 %
Lymphs Abs: 1.9 10*3/uL (ref 0.7–4.0)
MCH: 25.3 pg — ABNORMAL LOW (ref 26.0–34.0)
MCHC: 31.3 g/dL (ref 30.0–36.0)
MCV: 80.6 fL (ref 80.0–100.0)
Monocytes Absolute: 1.1 10*3/uL — ABNORMAL HIGH (ref 0.1–1.0)
Monocytes Relative: 9 %
Neutro Abs: 9.4 10*3/uL — ABNORMAL HIGH (ref 1.7–7.7)
Neutrophils Relative %: 75 %
Platelets: 541 10*3/uL — ABNORMAL HIGH (ref 150–400)
RBC: 3.96 MIL/uL (ref 3.87–5.11)
RDW: 15.4 % (ref 11.5–15.5)
WBC: 12.5 10*3/uL — ABNORMAL HIGH (ref 4.0–10.5)
nRBC: 0 % (ref 0.0–0.2)

## 2020-06-01 LAB — PHOSPHORUS: Phosphorus: 3.3 mg/dL (ref 2.5–4.6)

## 2020-06-01 LAB — URINALYSIS, ROUTINE W REFLEX MICROSCOPIC
Bilirubin Urine: NEGATIVE
Glucose, UA: >=500 mg/dL — AB
Hgb urine dipstick: NEGATIVE
Ketones, ur: 20 mg/dL — AB
Leukocytes,Ua: NEGATIVE
Nitrite: NEGATIVE
Protein, ur: >=300 mg/dL — AB
Specific Gravity, Urine: 1.012 (ref 1.005–1.030)
pH: 6 (ref 5.0–8.0)

## 2020-06-01 LAB — BETA-HYDROXYBUTYRIC ACID
Beta-Hydroxybutyric Acid: 1.93 mmol/L — ABNORMAL HIGH (ref 0.05–0.27)
Beta-Hydroxybutyric Acid: 1.51 mmol/L — ABNORMAL HIGH (ref 0.05–0.27)

## 2020-06-01 IMAGING — DX DG CHEST 1V PORT
1 series · 1 of 1 positions shown · non-contrast
Comparison: [DATE]

CLINICAL DATA: Nausea and vomiting.

EXAM:
PORTABLE CHEST 1 VIEW

[chest ap]
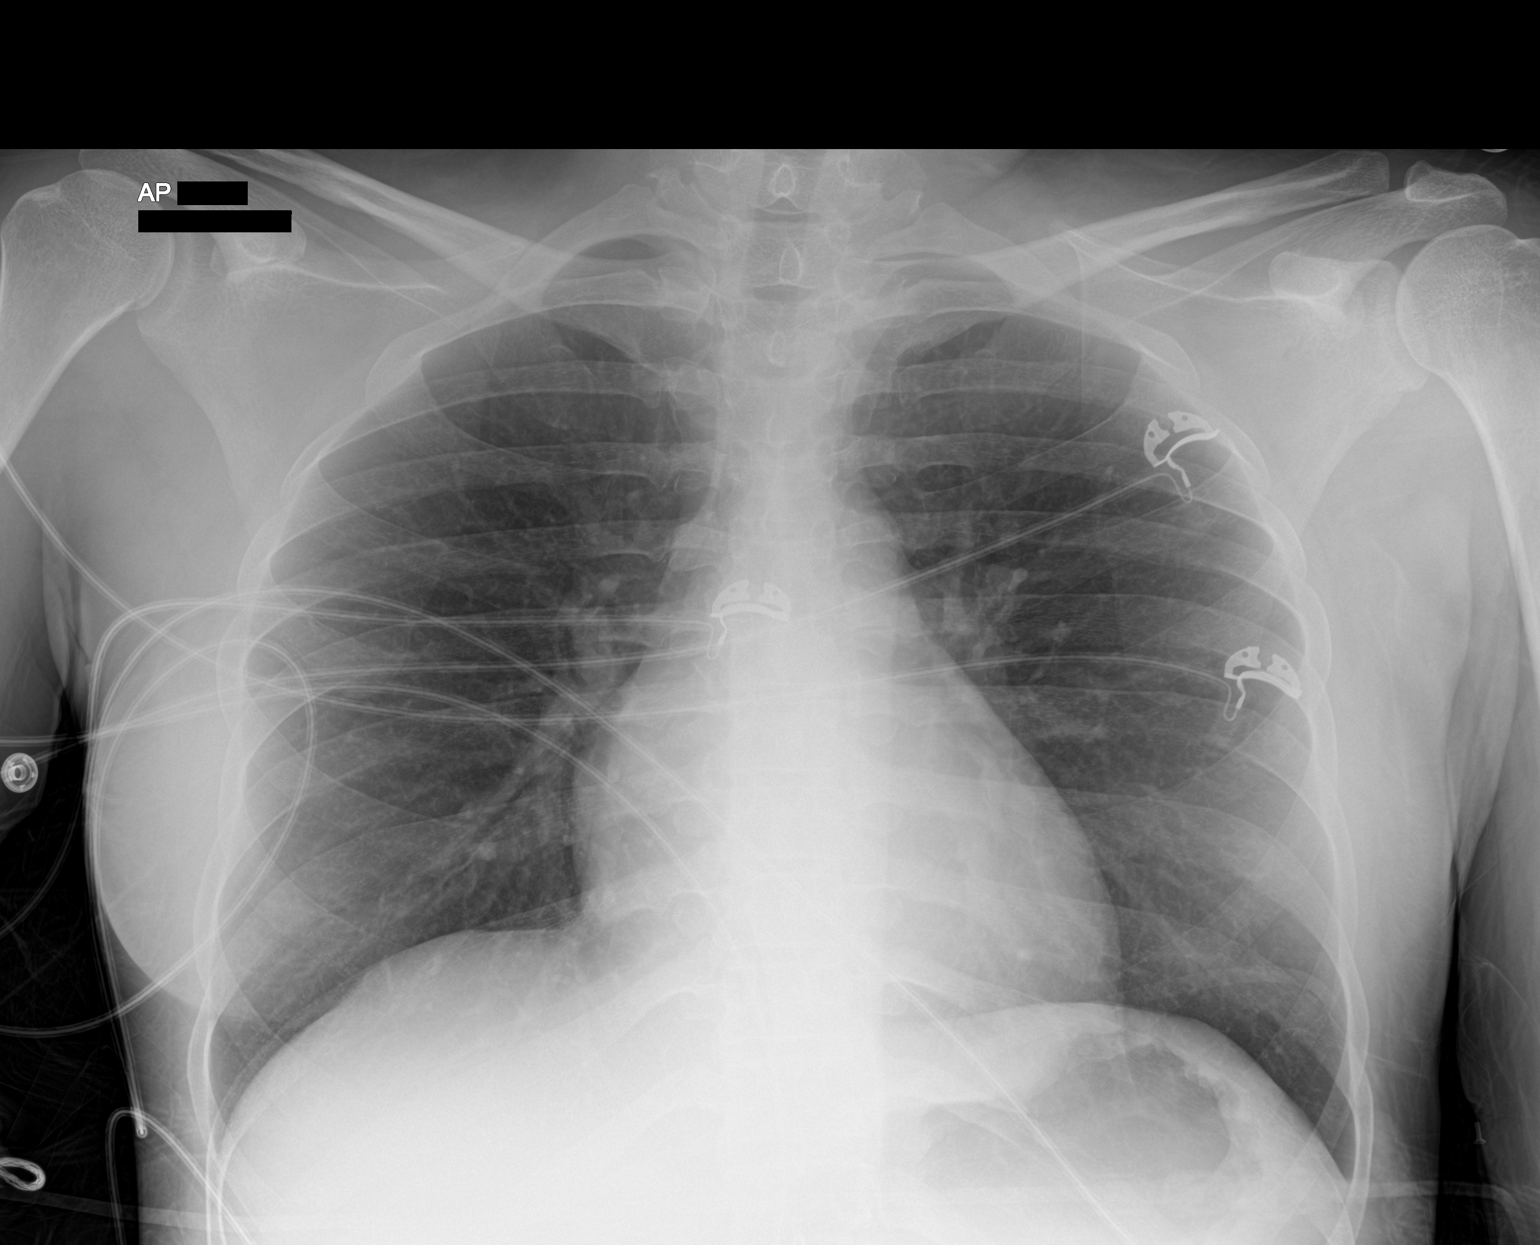

[1 of 1 positions shown; findings below may reference images not displayed]

FINDINGS: The heart size and mediastinal contours are within normal limits.
Both lungs are clear. The visualized skeletal structures are
unremarkable.
IMPRESSION: No active disease.

## 2020-06-01 IMAGING — US US RENAL
1 series · 15 of 24 positions shown · non-contrast
Comparison: Ultrasound kidneys [DATE]

CLINICAL DATA: Acute renal failure

EXAM:
RENAL / URINARY TRACT ULTRASOUND COMPLETE

[Series 1: us renal mc & wl · 15 of 24 slices shown]
[im 1/24]
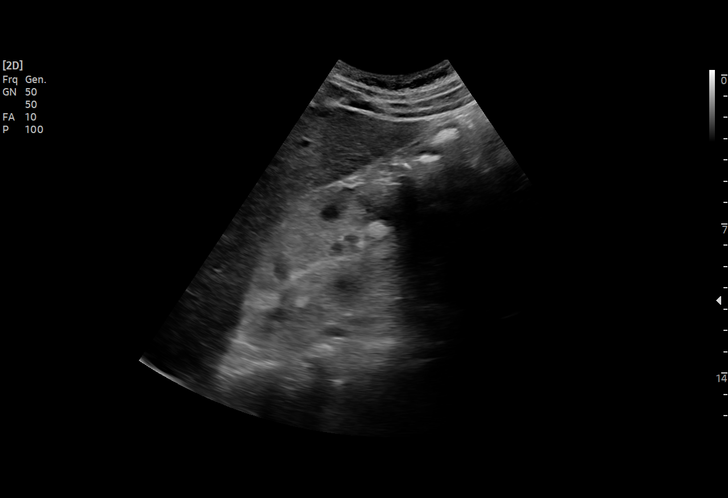
[im 3/24]
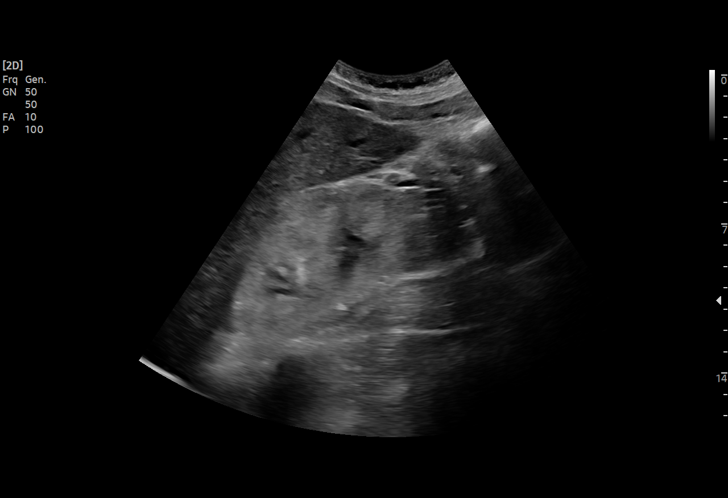
[im 5/24]
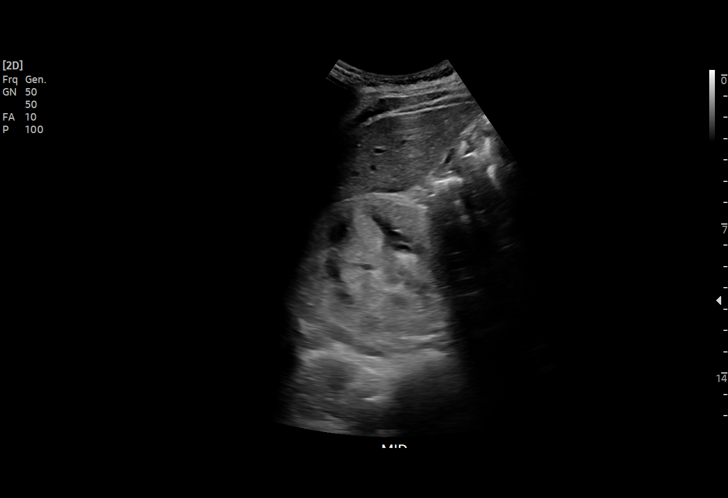
[im 6/24]
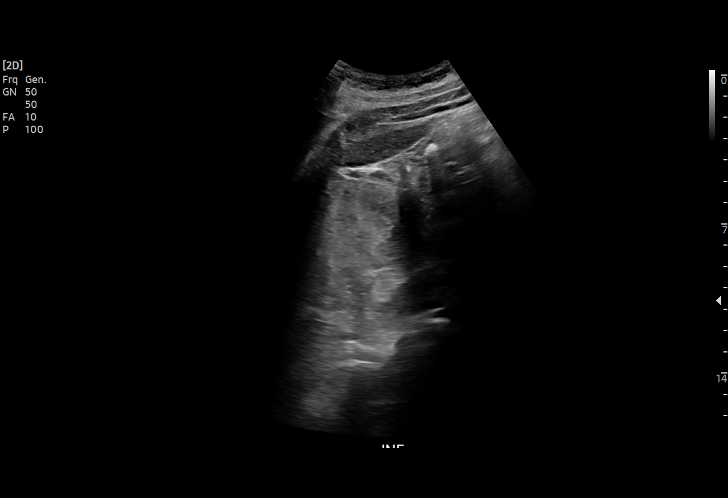
[im 8/24]
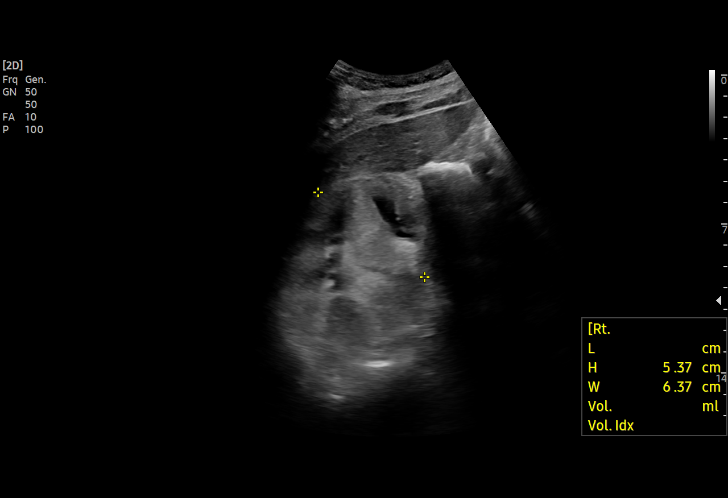
[im 9/24]
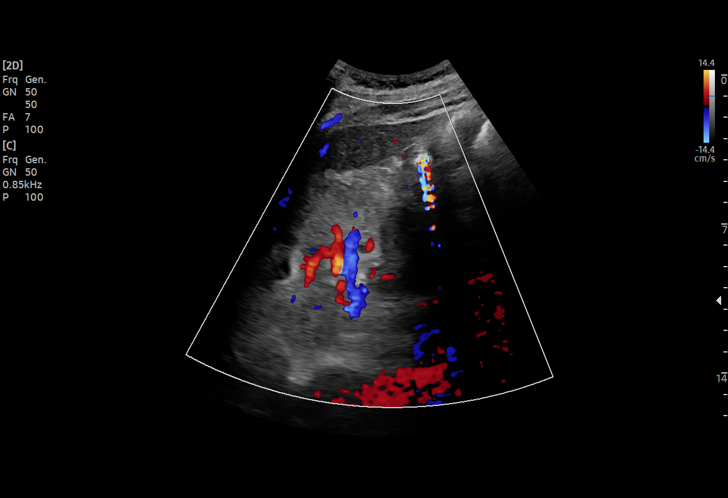
[im 11/24]
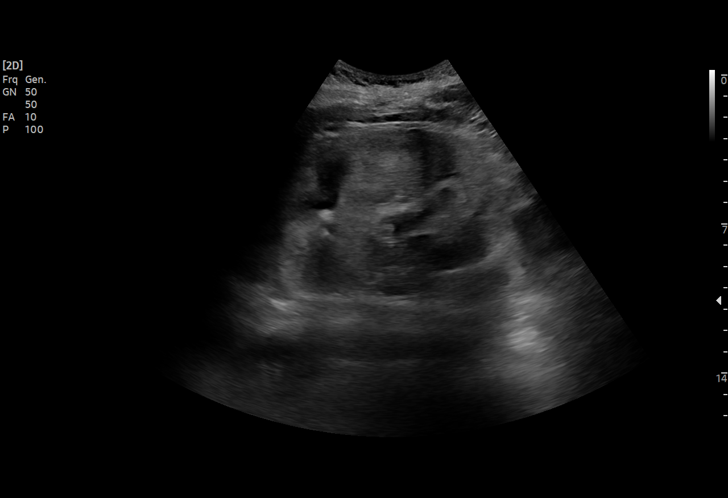
[im 13/24]
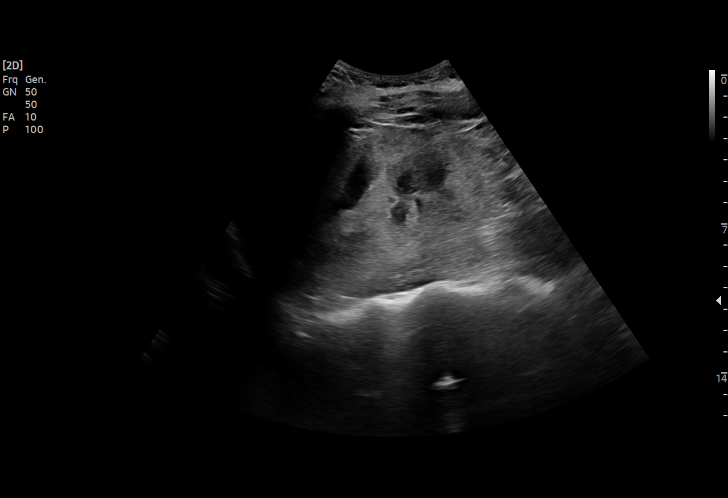
[im 14/24]
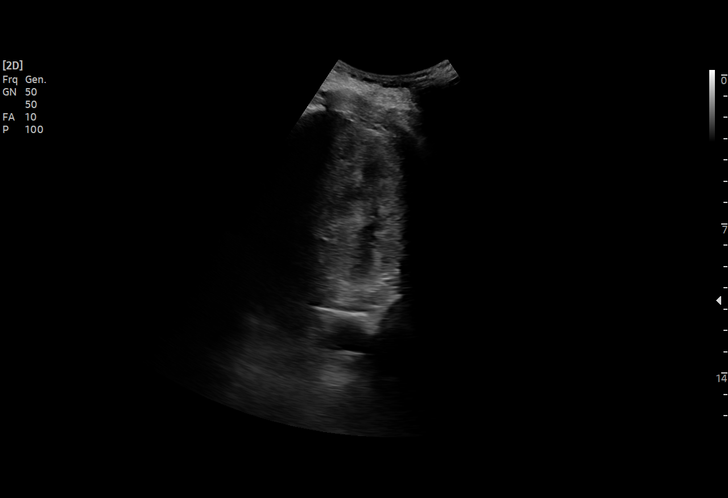
[im 16/24]
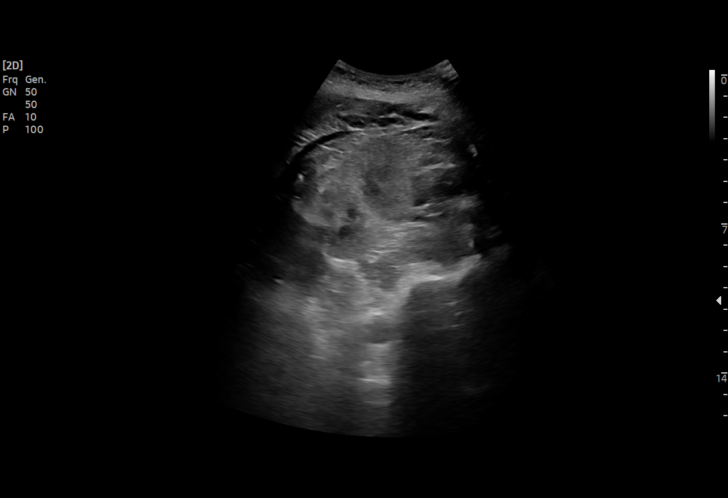
[im 17/24]
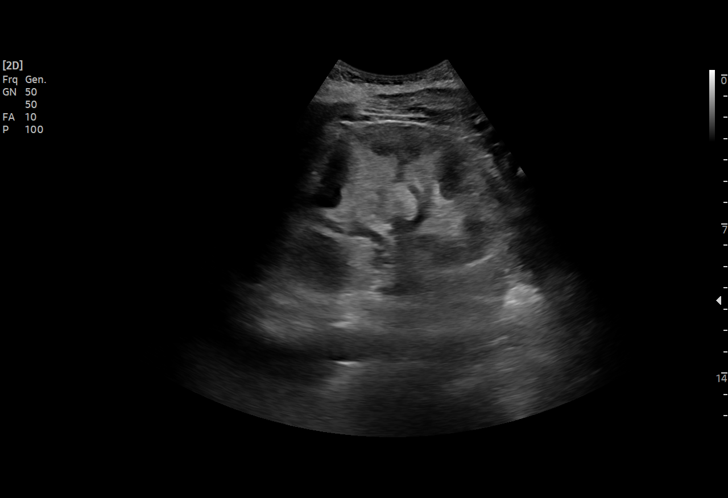
[im 19/24]
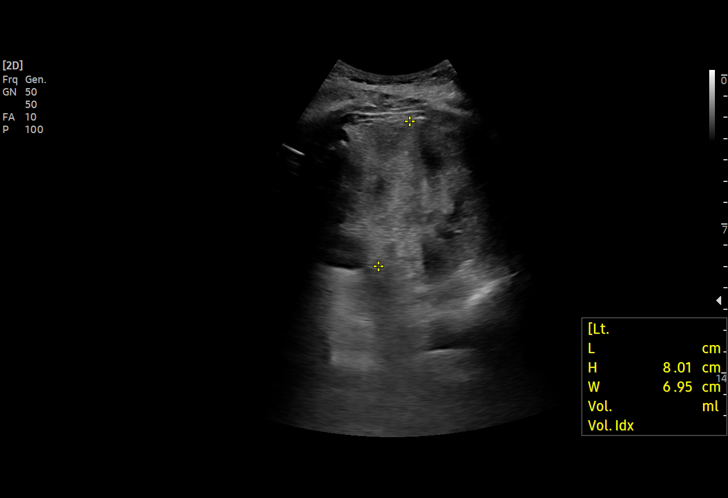
[im 21/24]
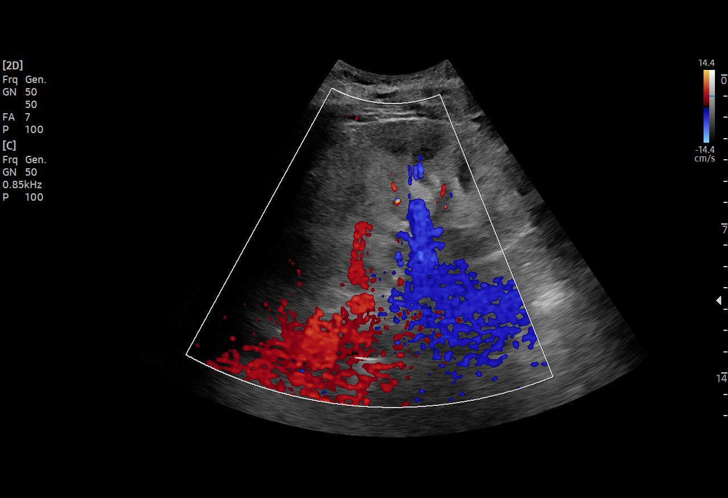
[im 22/24]
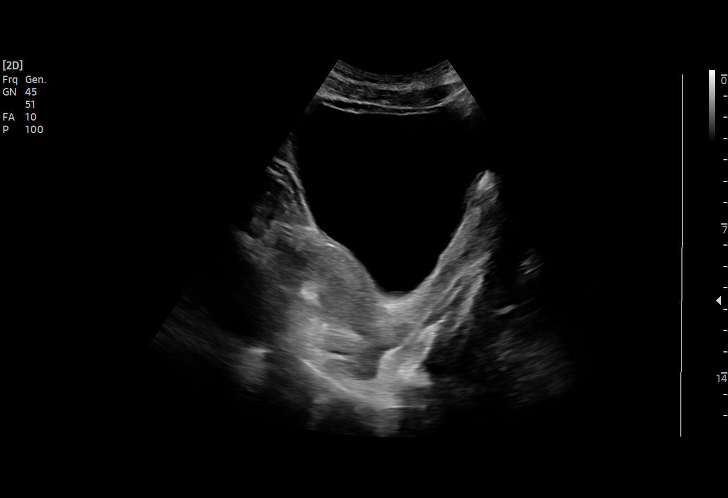
[im 24/24]
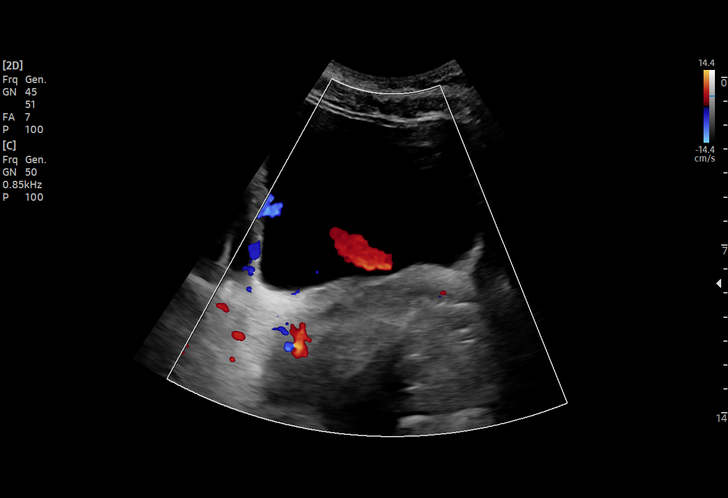

[15 of 24 positions shown; findings below may reference images not displayed]

FINDINGS: Right Kidney:

Renal measurements: 11.3 x 5.4 x 6.4 cm = volume: 202 mL. Diffuse
increased echogenicity the renal cortex. No hydronephrosis. No mass.

Left Kidney:

Renal measurements: 11.9 x 8.0 x 7.0 cm = volume: 348 mL. Diffuse
increased echogenicity the renal cortex. No hydronephrosis. No mass.

Bladder:

Appears normal for degree of bladder distention.

Other:

None.
IMPRESSION: Diffuse increased echogenicity of the renal cortices without
significant thinning, similar to prior exam.

## 2020-06-01 IMAGING — DX DG ABDOMEN 2V
3 series · 3 of 3 positions shown · non-contrast
Comparison: [DATE]

CLINICAL DATA: Nausea and vomiting.

EXAM:
ABDOMEN - 2 VIEW

[abdomen erect]
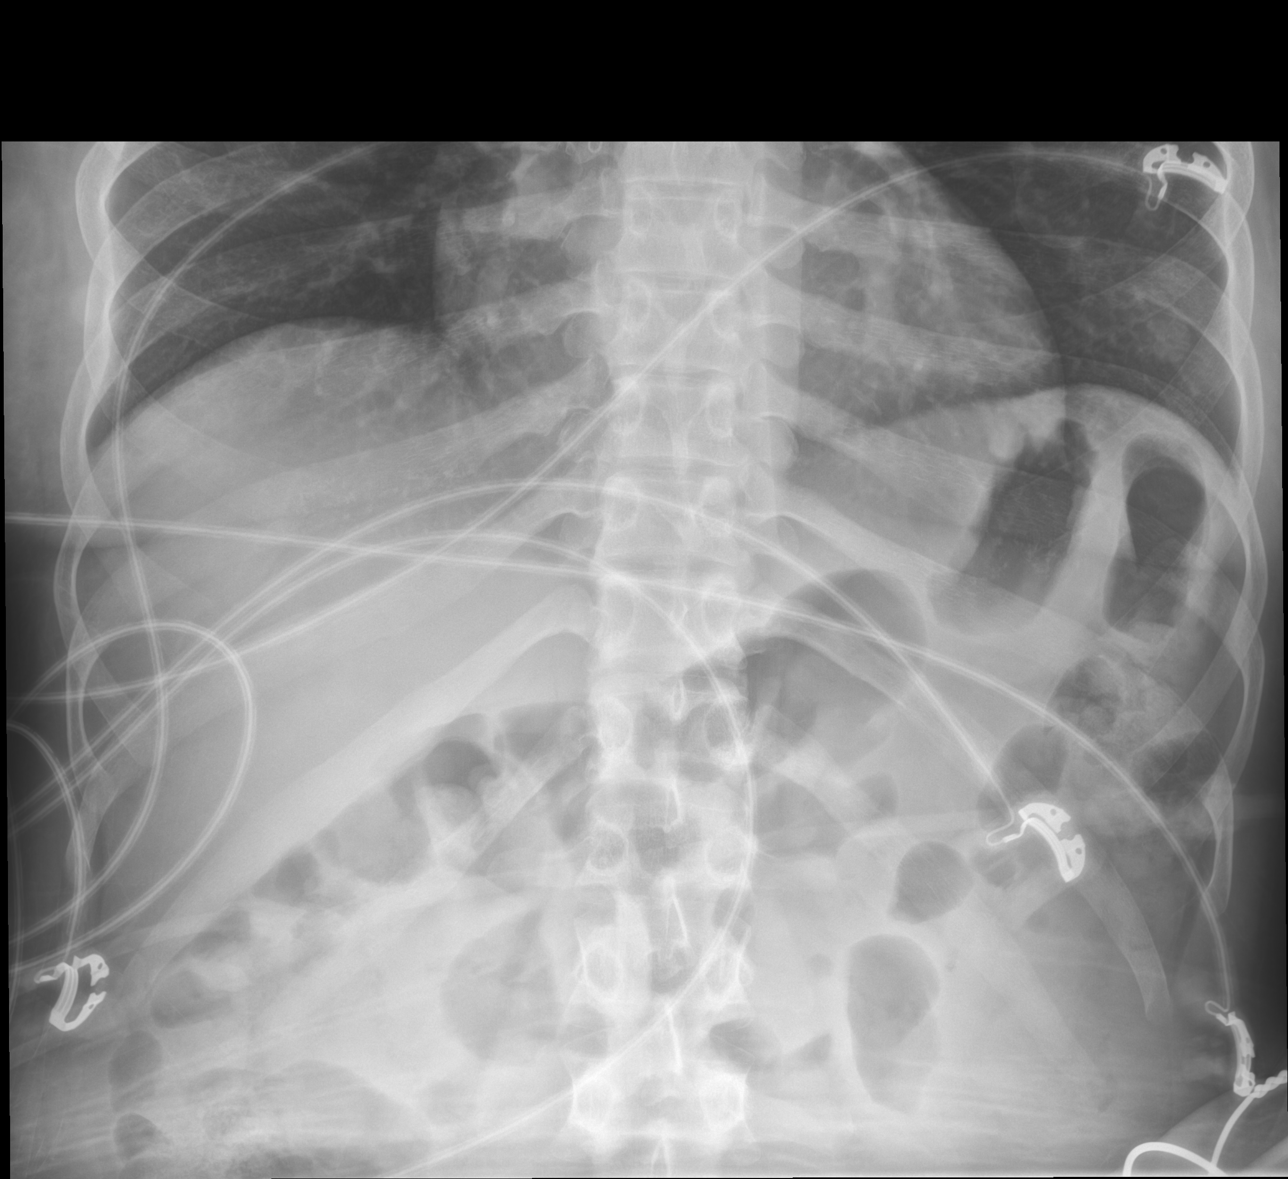

[abdomen supine (1 of 2)]
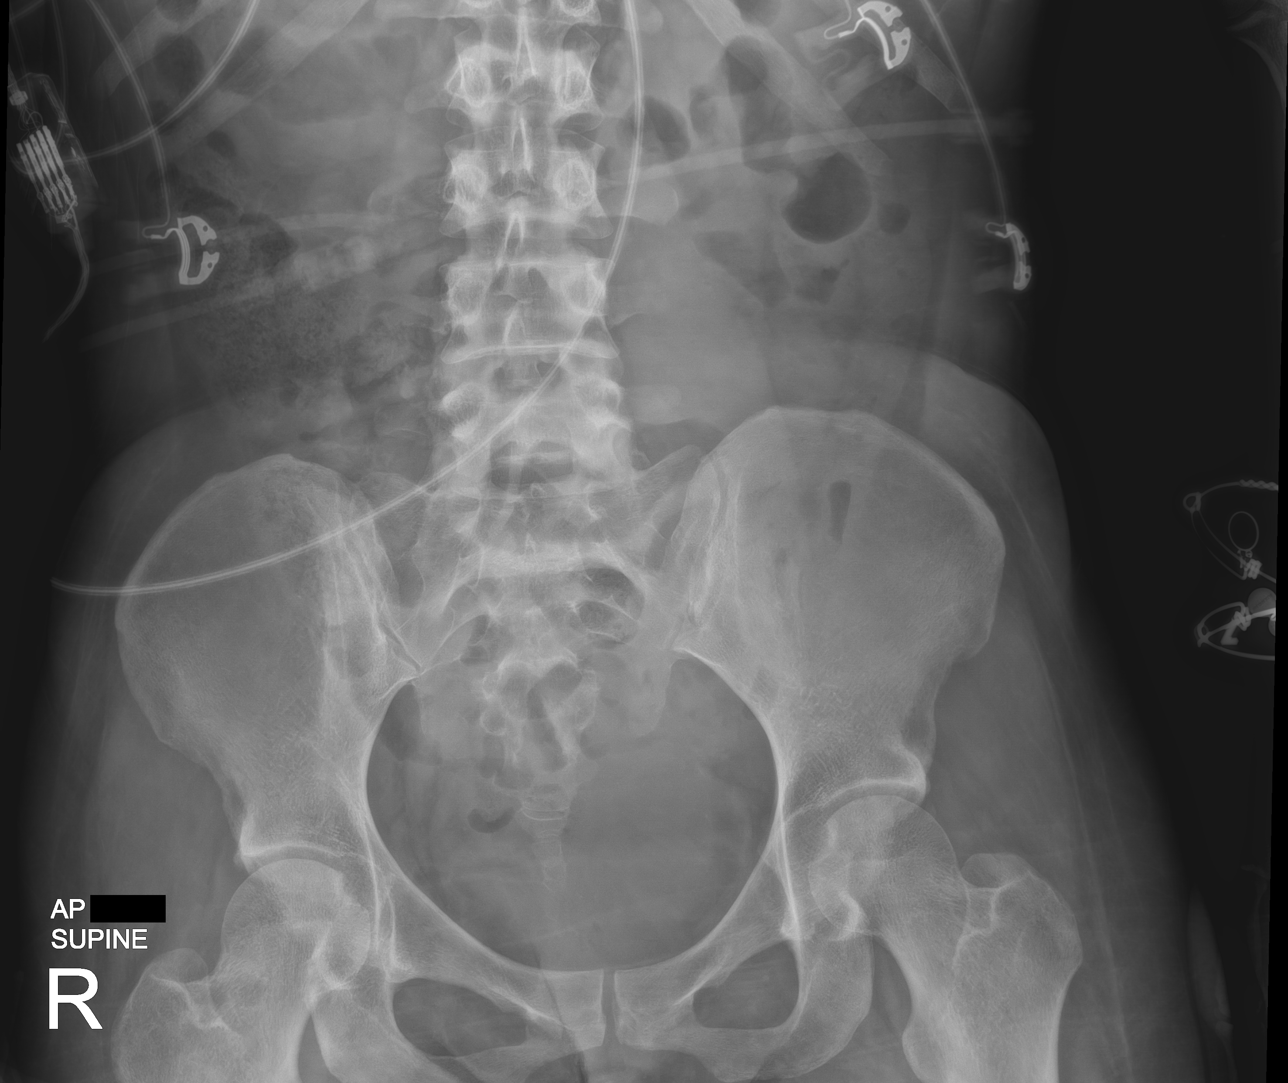

[abdomen supine (2 of 2)]
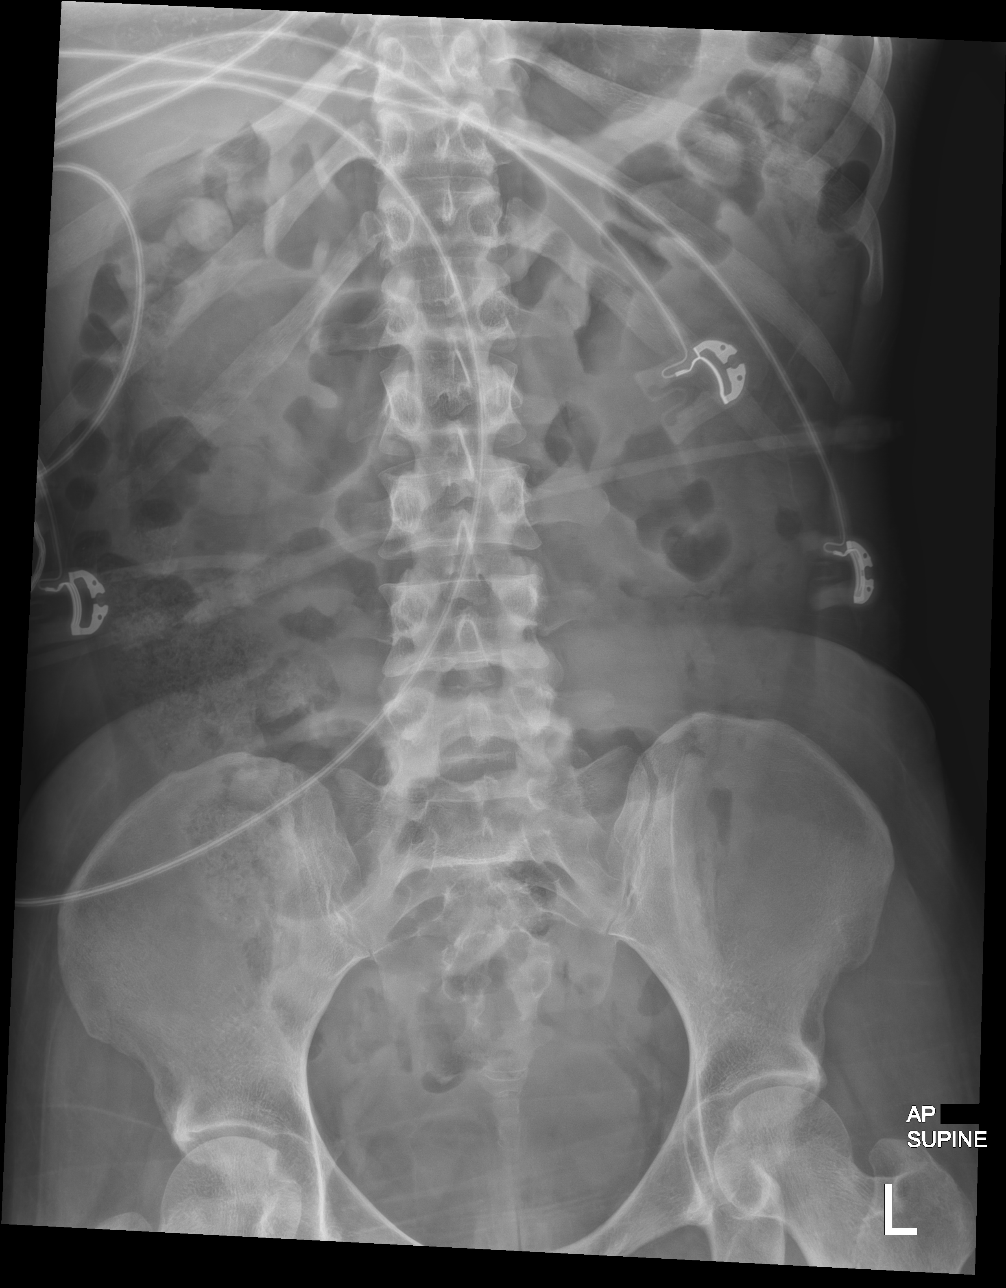

[3 of 3 positions shown; findings below may reference images not displayed]

FINDINGS: Bowel gas pattern is nonobstructed. No free intraperitoneal air or
evidence for organomegaly. Visualized osseous structures have a
normal appearance.
IMPRESSION: No evidence for acute  abnormality.

## 2020-06-01 MED ORDER — HEPARIN SODIUM (PORCINE) 5000 UNIT/ML IJ SOLN
5000.0000 [IU] | Freq: Three times a day (TID) | INTRAMUSCULAR | Status: DC
  Administered 2020-06-01 – 2020-06-09 (×24): 5000 [IU] via SUBCUTANEOUS
  Filled 2020-06-01 (×24): qty 1

## 2020-06-01 MED ORDER — TRAZODONE HCL 50 MG PO TABS
50.0000 mg | ORAL_TABLET | Freq: Every day | ORAL | Status: DC
  Administered 2020-06-01 – 2020-06-08 (×8): 50 mg via ORAL
  Filled 2020-06-01 (×8): qty 1

## 2020-06-01 MED ORDER — METOCLOPRAMIDE HCL 5 MG/ML IJ SOLN
5.0000 mg | Freq: Four times a day (QID) | INTRAMUSCULAR | Status: DC
  Administered 2020-06-01 – 2020-06-04 (×13): 5 mg via INTRAVENOUS
  Filled 2020-06-01 (×13): qty 2

## 2020-06-01 MED ORDER — ONDANSETRON HCL 4 MG/2ML IJ SOLN
4.0000 mg | Freq: Once | INTRAMUSCULAR | Status: AC
  Administered 2020-06-01: 4 mg via INTRAVENOUS
  Filled 2020-06-01: qty 2

## 2020-06-01 MED ORDER — CHLORHEXIDINE GLUCONATE CLOTH 2 % EX PADS
6.0000 | MEDICATED_PAD | Freq: Every day | CUTANEOUS | Status: DC
  Administered 2020-06-02 – 2020-06-07 (×3): 6 via TOPICAL

## 2020-06-01 MED ORDER — INSULIN GLARGINE 100 UNIT/ML ~~LOC~~ SOLN
16.0000 [IU] | SUBCUTANEOUS | Status: DC
  Administered 2020-06-01 – 2020-06-02 (×2): 16 [IU] via SUBCUTANEOUS
  Filled 2020-06-01 (×4): qty 0.16

## 2020-06-01 MED ORDER — PANTOPRAZOLE SODIUM 40 MG IV SOLR
40.0000 mg | INTRAVENOUS | Status: DC
  Administered 2020-06-01 – 2020-06-03 (×3): 40 mg via INTRAVENOUS
  Filled 2020-06-01 (×3): qty 40

## 2020-06-01 MED ORDER — SODIUM BICARBONATE 650 MG PO TABS
650.0000 mg | ORAL_TABLET | Freq: Three times a day (TID) | ORAL | Status: DC
  Administered 2020-06-01 – 2020-06-04 (×10): 650 mg via ORAL
  Filled 2020-06-01 (×10): qty 1

## 2020-06-01 MED ORDER — SODIUM CHLORIDE 0.9 % IV SOLN: INTRAVENOUS | Status: DC

## 2020-06-01 MED ORDER — PROCHLORPERAZINE EDISYLATE 10 MG/2ML IJ SOLN
10.0000 mg | Freq: Four times a day (QID) | INTRAMUSCULAR | Status: DC | PRN
  Administered 2020-06-02 – 2020-06-07 (×3): 10 mg via INTRAVENOUS
  Filled 2020-06-01 (×5): qty 2

## 2020-06-01 MED ORDER — HYDRALAZINE HCL 20 MG/ML IJ SOLN
10.0000 mg | Freq: Once | INTRAMUSCULAR | Status: AC
  Administered 2020-06-01: 10 mg via INTRAVENOUS
  Filled 2020-06-01: qty 1

## 2020-06-01 MED ORDER — LATANOPROST 0.005 % OP SOLN
1.0000 [drp] | Freq: Every day | OPHTHALMIC | Status: DC
  Administered 2020-06-01 – 2020-06-08 (×8): 1 [drp] via OPHTHALMIC
  Filled 2020-06-01: qty 2.5

## 2020-06-01 MED ORDER — HYDRALAZINE HCL 50 MG PO TABS
75.0000 mg | ORAL_TABLET | Freq: Three times a day (TID) | ORAL | Status: DC
  Administered 2020-06-01 – 2020-06-02 (×4): 75 mg via ORAL
  Filled 2020-06-01 (×4): qty 1

## 2020-06-01 MED ORDER — SERTRALINE HCL 100 MG PO TABS
100.0000 mg | ORAL_TABLET | Freq: Every morning | ORAL | Status: DC
  Administered 2020-06-01 – 2020-06-09 (×9): 100 mg via ORAL
  Filled 2020-06-01 (×9): qty 1

## 2020-06-01 MED ORDER — INSULIN ASPART 100 UNIT/ML IJ SOLN
0.0000 [IU] | Freq: Three times a day (TID) | INTRAMUSCULAR | Status: DC
  Administered 2020-06-01: 1 [IU] via SUBCUTANEOUS
  Administered 2020-06-01: 3 [IU] via SUBCUTANEOUS
  Administered 2020-06-02: 2 [IU] via SUBCUTANEOUS
  Administered 2020-06-02 – 2020-06-04 (×2): 1 [IU] via SUBCUTANEOUS
  Administered 2020-06-05: 2 [IU] via SUBCUTANEOUS
  Administered 2020-06-06: 1 [IU] via SUBCUTANEOUS
  Administered 2020-06-06: 3 [IU] via SUBCUTANEOUS
  Administered 2020-06-07: 1 [IU] via SUBCUTANEOUS
  Administered 2020-06-07: 3 [IU] via SUBCUTANEOUS
  Administered 2020-06-08: 1 [IU] via SUBCUTANEOUS
  Administered 2020-06-08: 3 [IU] via SUBCUTANEOUS
  Administered 2020-06-08: 1 [IU] via SUBCUTANEOUS
  Administered 2020-06-09: 2 [IU] via SUBCUTANEOUS
  Administered 2020-06-09: 1 [IU] via SUBCUTANEOUS

## 2020-06-01 MED ORDER — CEFAZOLIN SODIUM-DEXTROSE 2-4 GM/100ML-% IV SOLN
2.0000 g | INTRAVENOUS | Status: AC
  Filled 2020-06-01: qty 100

## 2020-06-01 NOTE — Consult Note (Signed)
Chief Complaint: Patient was seen in consultation today for  Chief Complaint  Patient presents with  . Emesis  . Abdominal Pain    Referring Physician(s): Dr. Jonnie Finner  Supervising Physician: Mir, Sharen Heck  Patient Status: [facility] - In-pt  History of Present Illness: Barbara Donovan is a 30 y.o. female with a medical history significant for Type 1 DM with retinopathy, diabetic nephropathy, progressive CKD and HTN. She presented to the ED on 05/31/20 with nausea and vomiting and was found to be in DKA with hypertensive urgency. She has been evaluated by the nephrology team and due to worsening renal function with uremic symptoms she now has pending plans to start hemodialysis.  Interventional Radiology has been asked to evaluate this patient for an image-guided tunneled dialysis catheter to facilitate her hemodialysis treatment plans.   Past Medical History:  Diagnosis Date  . Asthma    as a child, no problems as an adult, no inhaler  . Cataract    NS OU  . Chronic hypertension during pregnancy, antepartum 08/19/2017  . Dehydration 01/28/2018  . Depression during pregnancy, antepartum 07/07/2017   6/20: Short trial of zoloft previously, reports didn't help much but also didn't give it a chance Discussed r/b/a SSRIs in pregnancy, agrees to try Zoloft again, rx sent No SI/HI/red flags  . Diabetes ([place])    TYPE I  . Diabetic retinopathy ([place]) 06/09/2017   07/2017 with bilateral severe diabetic non-proliferative retinopathy with macular edema.  Marland Kitchen HTN (hypertension)   . Hypertensive retinopathy    OU  . Hypokalemia 01/22/2018  . Hypomagnesemia 01/28/2018  . Intractable nausea and vomiting 01/22/2018  . Intrauterine growth restriction (IUGR) affecting care of mother 12/22/2017  . Morbid obesity ([place])   . Nephropathy, diabetic ([place]) 12/29/2017  . Proteinuria due to type 1 diabetes mellitus ([place]) 11/02/2017   Baseline Pr: Cr 10.23  . Severe hyperemesis gravidarum 10/30/2017  . Type I  diabetes mellitus ([place]) 07/07/2017   Current Diabetic Medications:  Insulin  '[x]'$  Aspirin 81 mg daily after 12 weeks (? A2/B GDM)  Required Referrals for A1GDM or A2GDM: '[x]'$  Diabetes Education and Testing Supplies '[x]'$  Nutrition Cousult  For A2/B GDM or higher classes of DM '[x]'$  Diabetes Education and Testing Supplies '[x]'$  Nutrition Counsult '[x]'$  Fetal ECHO after 20 weeks  '[x]'$  Eye exam for retina evaluation - severe retinopathy 7/19  Base  . Ventricular septal defect (VSD) of fetus in singleton pregnancy, antepartum 09/30/2017   May go to newborn nursery per Dr. Lenard Simmer Echo prior to discharge    Past Surgical History:  Procedure Laterality Date  . [place] VITRECTOMY WITH 20 GAUGE MVR PORT FOR MACULAR HOLE Left 07/20/2018   Procedure: 25 GAUGE PARS PLANA VITRECTOMY LEFT EYE;  Surgeon: Bernarda Caffey, MD;  Location: [place];  Service: Ophthalmology;  Laterality: Left;  . CESAREAN SECTION N/A 12/26/2017   Procedure: CESAREAN SECTION;  Surgeon: Osborne Oman, MD;  Location: [place];  Service: Obstetrics;  Laterality: N/A;  . EYE EXAMINATION UNDER ANESTHESIA Right 07/20/2018   Procedure: Eye Exam Under Anesthesia RIGHT EYE;  Surgeon: Bernarda Caffey, MD;  Location: [place];  Service: Ophthalmology;  Laterality: Right;  . EYE SURGERY Left 07/2018  . GAS INSERTION Left 07/19/2019   Procedure: INSERTION OF GAS;  Surgeon: Bernarda Caffey, MD;  Location: [place];  Service: Ophthalmology;  Laterality: Left;  SF6  . INJECTION OF SILICONE OIL Left 99991111   Procedure: Injection Of Silicone Oil LEFT EYE;  Surgeon:  Bernarda Caffey, MD;  Location: [place];  Service: Ophthalmology;  Laterality: Left;  . LASER PHOTO ABLATION Right 07/20/2018   Procedure: Laser Photo Ablation RIGHT EYE;  Surgeon: Bernarda Caffey, MD;  Location: [place];  Service: Ophthalmology;  Laterality: Right;  . MEMBRANE PEEL Left 07/20/2018   Procedure: Membrane Peel LEFT EYE;  Surgeon: Bernarda Caffey, MD;  Location: [place];  Service: Ophthalmology;   Laterality: Left;  . MEMBRANE PEEL Left 07/19/2019   Procedure: MEMBRANE PEEL;  Surgeon: Bernarda Caffey, MD;  Location: [place];  Service: Ophthalmology;  Laterality: Left;  . MITOMYCIN C APPLICATION Bilateral 99991111   Procedure: Avastin Application;  Surgeon: Bernarda Caffey, MD;  Location: [place];  Service: Ophthalmology;  Laterality: Bilateral;  . PHOTOCOAGULATION WITH LASER Left 07/20/2018   Procedure: Photocoagulation With Laser LEFT EYE;  Surgeon: Bernarda Caffey, MD;  Location: [place];  Service: Ophthalmology;  Laterality: Left;  . PHOTOCOAGULATION WITH LASER Left 07/19/2019   Procedure: PHOTOCOAGULATION WITH LASER;  Surgeon: Bernarda Caffey, MD;  Location: [place];  Service: Ophthalmology;  Laterality: Left;  . RETINAL DETACHMENT SURGERY Left 07/20/2018   Dr. Bernarda Caffey  . SILICON OIL REMOVAL Left 07/19/2019   Procedure: 25g PARS PLANA VITRECTOMY WITH SILICON OIL REMOVAL;  Surgeon: Bernarda Caffey, MD;  Location: [place];  Service: Ophthalmology;  Laterality: Left;  . WISDOM TOOTH EXTRACTION      Allergies: Patient has no known allergies.  Medications: Prior to Admission medications   Medication Sig Start Date End Date Taking? Authorizing Provider  amLODipine (NORVASC) 10 MG tablet Take 1 tablet (10 mg total) by mouth daily. 03/09/20   Mercy Riding, MD  carvedilol (COREG) 25 MG tablet Take 1 tablet (25 mg total) by mouth 2 (two) times daily. 03/07/19   Imogene Burn, PA-C  cloNIDine (CATAPRES - DOSED IN MG/24 HR) 0.2 mg/24hr patch Place 1 patch (0.2 mg total) onto the skin once a week. 04/26/20   Eugenie Filler, MD  Continuous Blood Gluc Sensor MISC 1 each by Does not apply route as directed. Use as directed every 14 days. May dispense FreeStyle [employer] or similar. 01/27/20   Antonieta Pert, MD  hydrALAZINE (APRESOLINE) 25 MG tablet Take 3 tablets (75 mg total) by mouth 3 (three) times daily. 03/08/20   Mercy Riding, MD  insulin glargine (LANTUS SOLOSTAR) 100 UNIT/ML Solostar Pen Inject 16  Units into the skin every morning. 04/25/20   Renato Shin, MD  insulin lispro (HUMALOG KWIKPEN) 100 UNIT/ML KwikPen Inject 2 Units into the skin 3 (three) times daily with meals. 04/25/20   Renato Shin, MD  latanoprost (XALATAN) 0.005 % ophthalmic solution Place 1 drop into the left eye at bedtime. 02/28/20   Bernarda Caffey, MD  metoCLOPramide (REGLAN) 5 MG tablet Take 1 tablet (5 mg total) by mouth every 8 (eight) hours as needed for nausea or vomiting. 02/10/20   Mercy Riding, MD  multivitamin (RENA-VIT) TABS tablet Take 1 tablet by mouth at bedtime. 03/08/20   Mercy Riding, MD  pantoprazole (PROTONIX) 40 MG tablet Take 1 tablet (40 mg total) by mouth daily. 01/31/20   Ladene Artist, MD  scopolamine (TRANSDERM-SCOP) 1 MG/3DAYS Place 1 patch (1.5 mg total) onto the skin every 3 (three) days. 04/13/20   Georgette Shell, MD  sertraline (ZOLOFT) 100 MG tablet Take 100 mg by mouth every morning. 02/15/20   [provider]  sodium bicarbonate 650 MG tablet Take 1 tablet (650 mg total) by mouth  3 (three) times daily. 04/22/20   Eugenie Filler, MD  traZODone (DESYREL) 50 MG tablet Take 50 mg by mouth at bedtime. 02/16/20   [provider]     Family History  Problem Relation Age of Onset  . Diabetes Mother   . Aneurysm Mother 34  . Seizures Mother   . Diabetes Father   . Cataracts Father   . COPD Father   . Heart attack Father   . Heart disease Father   . Healthy Sister   . Healthy Daughter   . Stroke Maternal Grandfather   . Amblyopia Neg Hx   . Blindness Neg Hx   . Glaucoma Neg Hx   . Macular degeneration Neg Hx   . Retinal detachment Neg Hx   . Strabismus Neg Hx   . Retinitis pigmentosa Neg Hx   . Colon cancer Neg Hx   . Stomach cancer Neg Hx   . Esophageal cancer Neg Hx   . Pancreatic cancer Neg Hx   . Liver disease Neg Hx     Social History   Socioeconomic History  . Marital status: Significant Other    Spouse name: Not on file  . Number of children:  Not on file  . Years of education: Not on file  . Highest education level: Not on file  Occupational History  . Not on file  Tobacco Use  . Smoking status: Never Smoker  . Smokeless tobacco: Never Used  Vaping Use  . Vaping Use: Never used  Substance and Sexual Activity  . Alcohol use: Not Currently    Comment: SOCIALLY  . Drug use: Never  . Sexual activity: Not Currently    Birth control/protection: Pill  Other Topics Concern  . Not on file  Social History Narrative  . Not on file   Social Determinants of Health   Financial Resource Strain: Not on file  Food Insecurity: Not on file  Transportation Needs: Not on file  Physical Activity: Not on file  Stress: Not on file  Social Connections: Not on file    Review of Systems: A 12 point ROS discussed and pertinent positives are indicated in the HPI above.  All other systems are negative.  Review of Systems  Constitutional: Positive for appetite change and fatigue.  Respiratory: Negative for cough and shortness of breath.   Cardiovascular: Negative for chest pain and leg swelling.  Gastrointestinal: Positive for nausea and vomiting. Negative for diarrhea.  Neurological: Positive for headaches.    Vital Signs: BP (!) 151/74   Pulse (!) 135   Temp 98.1 F (36.7 C)   Resp 19   Ht '5\' 6"'$  (1.676 m)   Wt 146 lb 6.2 oz (66.4 kg)   SpO2 100%   BMI 23.63 kg/m   Physical Exam Constitutional:      General: She is not in acute distress.    Appearance: She is ill-appearing.  HENT:     Mouth/Throat:     Mouth: Mucous membranes are moist.     Pharynx: Oropharynx is clear.  Cardiovascular:     Rate and Rhythm: Regular rhythm. Tachycardia present.     Pulses: Normal pulses.     Heart sounds: Normal heart sounds.  Pulmonary:     Effort: Pulmonary effort is normal.     Breath sounds: Normal breath sounds.  Abdominal:     General: Bowel sounds are normal.     Palpations: Abdomen is soft.     Tenderness: There is  abdominal  tenderness.  Musculoskeletal:     Right lower leg: No edema.     Left lower leg: No edema.  Skin:    General: Skin is warm and dry.  Neurological:     Mental Status: She is alert and oriented to person, place, and time.     Imaging: US RENAL  Result Date: 06/01/2020 CLINICAL DATA:  Acute renal failure EXAM: RENAL / URINARY TRACT ULTRASOUND COMPLETE COMPARISON:  Ultrasound kidneys 03/06/2020 FINDINGS: Right Kidney: Renal measurements: 11.3 x 5.4 x 6.4 cm = volume: 202 mL. Diffuse increased echogenicity the renal cortex. No hydronephrosis. No mass. Left Kidney: Renal measurements: 11.9 x 8.0 x 7.0 cm = volume: 348 mL. Diffuse increased echogenicity the renal cortex. No hydronephrosis. No mass. Bladder: Appears normal for degree of bladder distention. Other: None. IMPRESSION: Diffuse increased echogenicity of the renal cortices without significant thinning, similar to prior exam. Electronically Signed   By: Miachel Roux M.D.   On: 06/01/2020 10:17   DG CHEST PORT 1 VIEW  Result Date: 06/01/2020 CLINICAL DATA:  Nausea and vomiting. EXAM: PORTABLE CHEST 1 VIEW COMPARISON:  04/20/2020 FINDINGS: The heart size and mediastinal contours are within normal limits. Both lungs are clear. The visualized skeletal structures are unremarkable. IMPRESSION: No active disease. Electronically Signed   By: Nolon Nations M.D.   On: 06/01/2020 11:25   DG Abd 2 Views  Result Date: 06/01/2020 CLINICAL DATA:  Nausea and vomiting. EXAM: ABDOMEN - 2 VIEW COMPARISON:  01/28/2018 FINDINGS: Bowel gas pattern is nonobstructed. No free intraperitoneal air or evidence for organomegaly. Visualized osseous structures have a normal appearance. IMPRESSION: No evidence for acute  abnormality. Electronically Signed   By: Nolon Nations M.D.   On: 06/01/2020 11:29    Labs:  CBC: Recent Labs    04/21/20 0223 04/22/20 0451 05/31/20 1223 06/01/20 0836  WBC 9.4 7.5 10.2 12.5*  HGB 9.1* 9.4* 11.8* 10.0*  HCT  29.0* 30.8* 37.0 31.9*  PLT 374 339 577* 541*    COAGS: Recent Labs    01/24/20 1938  INR 1.0  APTT 26    BMP: Recent Labs    09/14/19 1157 09/14/19 1557 09/14/19 1957 09/15/19 0358 12/21/19 1344 05/31/20 2032 06/01/20 0035 06/01/20 0348 06/01/20 0720  NA 133* 133* 135 134*   < > 142 144 146* 144  K 2.9* 3.3* 2.9* 2.8*   < > 3.9 3.6 3.8 3.6  CL 102 103 103 100   < > 111 114* 116* 117*  CO2 22 19* 21* 23   < > 16* 17* 18* 17*  GLUCOSE 176* 206* 217* 166*   < > 224* 180* 167* 179*  BUN '18 19 20 18   '$ < > 57* 59* 57* 55*  CALCIUM 7.9* 7.8* 8.0* 8.2*   < > 9.4 9.6 9.5 9.1  CREATININE 2.40* 2.18* 2.39* 2.48*   < > 6.97* 7.13* 7.23* 7.05*  GFRNONAA 27* 30* 27* 26*   < > 8* 7* 7* 8*  GFRAA 31* 35* 31* 30*  --   --   --   --   --    < > = values in this interval not displayed.    LIVER FUNCTION TESTS: Recent Labs    03/05/20 0256 03/05/20 0922 04/10/20 0032 04/19/20 1240 04/22/20 0451 05/31/20 1223  BILITOT 0.7  --  0.7 1.0  --  0.7  AST 21  --  31 16  --  19  ALT 14  --  21 15  --  14  ALKPHOS 78  --  88 81  --  67  PROT 7.4  --  8.2* 7.7  --  8.0  ALBUMIN 2.7*   < > 3.5 2.8* 2.0* 3.3*   < > = values in this interval not displayed.    TUMOR MARKERS: No results for input(s): AFPTM, CEA, CA199, CHROMGRNA in the last 8760 hours.  Assessment and Plan:  CKD stage V; pending hemodialysis: Giulia P. Mcbee, 30 year old female, is tentatively scheduled for a tunneled dialysis catheter placement 06/02/20, depending on IR schedule. She has pending plans for transfer to [facility].  Risks and benefits discussed with the patient including, but not limited to bleeding, infection, vascular injury, pneumothorax which may require chest tube placement, air embolism or even death  All of the patient's questions were answered, patient is agreeable to proceed. She will be NPO at midnight. AM labs are ordered.  Consent signed and in chart.  Thank you for this interesting consult.  I  greatly enjoyed meeting AGATHA FORCUCCI and look forward to participating in their care.  A copy of this report was sent to the requesting provider on this date.  Electronically Signed: Soyla Dryer, AGACNP-BC [phone] 06/01/2020, 2:32 PM   I spent a total of 20 Minutes    in face to face in clinical consultation, greater than 50% of which was counseling/coordinating care for tunneled dialysis catheter.

## 2020-06-01 NOTE — Progress Notes (Signed)
PROGRESS NOTE    Barbara Donovan  [id number] DOB: 01/27/1990 DOA: 05/31/2020 PCP: Fanny Bien, MD (Confirm with patient/family/NH records and if not entered, this HAS to be entered at [facility] point of entry. "No PCP" if truly none.)   Chief Complaint  Patient presents with  . Emesis  . Abdominal Pain    Brief Narrative:  Patient is a 30 year old female history of hypertension, chronic kidney disease stage V, diabetes mellitus type 1 presented to the ED with nausea and vomiting and noted to be in DKA, hypertensive urgency currently on a Cardene drip, acute on chronic kidney disease stage V.  Nephrology consulted.   Assessment & Plan:   Principal Problem:   DKA (diabetic ketoacidosis) ([place]) Active Problems:   Hypertensive urgency   Diabetic retinopathy ([place])   Acute depression   Chronic hypertension   Nephropathy, diabetic (HCC)   Intractable nausea and vomiting   Dehydration   Anemia due to stage 5 chronic kidney disease (HCC)   Renal failure (ARF), acute on chronic (HCC)  1 early DKA and type I diabetic (hemoglobin A1c 7.5) -Patient presented with nausea vomiting.  Patient noted to be in DKA with a bicarb of 18, elevated blood glucose levels, urinalysis with greater than 500 glucose, 20 ketones, protein > 300.  Beta hydroxybutyrate elevated at 3.08.  VBG with a pH of 7.380, PCO2 < 19, bicarb of 8.8.  Patient admitted placed on the Endo tool.  Beta hydroxybutyrate levels have trended down currently at 1.51.  Anion gap closed currently at 10 with a bicarb of 17.   -Patient afebrile, denies any respiratory symptoms, denies any dysuria. -EKG with normal sinus rhythm. -Check abdominal films, chest x-ray. -Patient with chronic kidney disease on bicarb tablets.   -Patient with clinical improvement. -Patient noted to have a normal gastric emptying study done on 04/18/2020 -We will transition back to home regimen of Lantus 16 units daily, discontinue insulin drip 2 hours after  subcutaneous Lantus is given. -Place on scheduled Reglan 5 mg every 6 hours. -Protonix 40 mg IV daily. -Place on SSI. -Placed on a clear liquid diet. -Once insulin drip has been discontinued change IV fluids to normal saline at 100 cc an hour. -Consult with diabetic coordinator. -Follow.  2.  Acute renal failure on chronic kidney disease stage V -Likely prerenal azotemia secondary to problem #1 -Baseline creatinine approximately 5.5 -Urine output recorded of 700 cc since admission -Check urine sodium, urine creatinine, renal ultrasound. -Continue hydration with IV fluids. -Resume home regimen of bicarb tablets. -Consult with nephrology for further evaluation and management.  3.  Hypertensive urgency -Patient noted on admission to be in hypertensive urgency with systolic blood pressure going as high as the 200s. -Patient currently on Cardene drip. -Continue clonidine patch. -Resume home regimen hydralazine. -Wean Cardene drip. -Once Cardene drip has been weaned off may resume home regimen Coreg.  4.  Depression -Resume home regimen Zoloft.  5.  Dehydration -IV fluids   DVT prophylaxis: Heparin Code Status: Full Family Communication: Updated patient.  No family at bedside Disposition:   Status is: Inpatient    Dispo: The patient is from: Home              Anticipated d/c is to: Home              Patient currently on Endo tool, being transitioned to subcutaneous insulin, on a Cardene drip for hypertensive urgency, and acute on chronic kidney disease.  Not stable for discharge.  Difficult to place patient no       Consultants:   Nephrology pending  Procedures:   Chest x-ray pending  Antimicrobials:   None   Subjective: Patient sitting up in his side of the bed.  Denies any chest pain.  No shortness of breath.  No abdominal pain.  No dysuria.  Stated had an episode of nausea and one small episode of emesis this morning.  Overall feeling better.  Asking for  a large bottle of water to drink.  States has been compliant with medications  Objective: Vitals:   06/01/20 0600 06/01/20 0630 06/01/20 0800 06/01/20 0905  BP: (!) 159/77 (!) 172/74 (!) 193/82 (!) 160/69  Pulse: (!) 128 (!) 120 (!) 120   Resp: (!) '21 17 16   '$ Temp:   98.4 F (36.9 C)   TempSrc:   Axillary   SpO2: 99% 100% 100%   Weight:      Height:        Intake/Output Summary (Last 24 hours) at 06/01/2020 0941 Last data filed at 06/01/2020 0859 Gross per 24 hour  Intake 4871.88 ml  Output 700 ml  Net 4171.88 ml   Filed Weights   05/31/20 1220 05/31/20 1644  Weight: 73.9 kg 66.4 kg    Examination:  General exam: Appears calm and comfortable.  Dry mucous membranes Respiratory system: Clear to auscultation.  No wheezes, no crackles, no rhonchi.  Respiratory effort normal. Cardiovascular system: S1 & S2 heard, RRR. No JVD, murmurs, rubs, gallops or clicks. No pedal edema. Gastrointestinal system: Abdomen is nondistended, soft and nontender. No organomegaly or masses felt. Normal bowel sounds heard. Central nervous system: Alert and oriented. No focal neurological deficits. Extremities: Symmetric 5 x 5 power. Skin: No rashes, lesions or ulcers Psychiatry: Judgement and insight appear normal. Mood & affect appropriate.     Data Reviewed: I have personally reviewed following labs and imaging studies  CBC: Recent Labs  Lab 05/31/20 1223 06/01/20 0836  WBC 10.2 12.5*  NEUTROABS  --  9.4*  HGB 11.8* 10.0*  HCT 37.0 31.9*  MCV 79.9* 80.6  PLT 577* 541*    Basic Metabolic Panel: Recent Labs  Lab 05/31/20 1644 05/31/20 2032 06/01/20 0035 06/01/20 0348 06/01/20 0720  NA 139 142 144 146* 144  K 4.2 3.9 3.6 3.8 3.6  CL 109 111 114* 116* 117*  CO2 15* 16* 17* 18* 17*  GLUCOSE 195* 224* 180* 167* 179*  BUN 56* 57* 59* 57* 55*  CREATININE 7.02* 6.97* 7.13* 7.23* 7.05*  CALCIUM 9.5 9.4 9.6 9.5 9.1    GFR: Estimated Creatinine Clearance: 11 mL/min (A) (by C-G  formula based on SCr of 7.05 mg/dL (H)).  Liver Function Tests: Recent Labs  Lab 05/31/20 1223  AST 19  ALT 14  ALKPHOS 67  BILITOT 0.7  PROT 8.0  ALBUMIN 3.3*    CBG: Recent Labs  Lab 06/01/20 0344 06/01/20 0542 06/01/20 0643 06/01/20 0803 06/01/20 0848  GLUCAP 164* 137* 149* 167* 178*     Recent Results (from the past 240 hour(s))  MRSA PCR Screening     Status: None   Collection Time: 05/31/20  4:13 PM   Specimen: Nasal Mucosa; Nasopharyngeal  Result Value Ref Range Status   MRSA by PCR NEGATIVE NEGATIVE Final    Comment:        The GeneXpert MRSA Assay (FDA approved for NASAL specimens only), is one component of a comprehensive MRSA colonization surveillance program. It is not intended to diagnose MRSA  infection nor to guide or monitor treatment for MRSA infections. Performed at [facility], [place] [address] [place], [place] [address]   Resp Panel by RT-PCR (Flu A&B, Covid) Nasopharyngeal Swab     Status: None   Collection Time: 05/31/20  7:00 PM   Specimen: Nasopharyngeal Swab; Nasopharyngeal(NP) swabs in vial transport medium  Result Value Ref Range Status   SARS Coronavirus 2 by RT PCR NEGATIVE NEGATIVE Final    Comment: (NOTE) SARS-CoV-2 target nucleic acids are NOT DETECTED.  The SARS-CoV-2 RNA is generally detectable in upper respiratory specimens during the acute phase of infection. The lowest concentration of SARS-CoV-2 viral copies this assay can detect is 138 copies/mL. A negative result does not preclude SARS-Cov-2 infection and should not be used as the sole basis for treatment or other patient management decisions. A negative result may occur with  improper specimen collection/handling, submission of specimen other than nasopharyngeal swab, presence of viral mutation(s) within the areas targeted by this assay, and inadequate number of viral copies(<138 copies/mL). A negative result must be combined with clinical  observations, patient history, and epidemiological information. The expected result is Negative.  Fact Sheet for Patients:  EntrepreneurPulse.com.au  Fact Sheet for Healthcare Providers:  IncredibleEmployment.be  This test is no t yet approved or cleared by the [place] FDA and  has been authorized for detection and/or diagnosis of SARS-CoV-2 by FDA under an Emergency Use Authorization (EUA). This EUA will remain  in effect (meaning this test can be used) for the duration of the COVID-19 declaration under Section 564(b)(1) of the Act, 21 U.S.C.section 360bbb-3(b)(1), unless the authorization is terminated  or revoked sooner.       Influenza A by PCR NEGATIVE NEGATIVE Final   Influenza B by PCR NEGATIVE NEGATIVE Final    Comment: (NOTE) The Xpert Xpress SARS-CoV-2/FLU/RSV plus assay is intended as an aid in the diagnosis of influenza from Nasopharyngeal swab specimens and should not be used as a sole basis for treatment. Nasal washings and aspirates are unacceptable for Xpert Xpress SARS-CoV-2/FLU/RSV testing.  Fact Sheet for Patients: EntrepreneurPulse.com.au  Fact Sheet for Healthcare Providers: IncredibleEmployment.be  This test is not yet approved or cleared by the [place] FDA and has been authorized for detection and/or diagnosis of SARS-CoV-2 by FDA under an Emergency Use Authorization (EUA). This EUA will remain in effect (meaning this test can be used) for the duration of the COVID-19 declaration under Section 564(b)(1) of the Act, 21 U.S.C. section 360bbb-3(b)(1), unless the authorization is terminated or revoked.  Performed at [facility], [place] [address], [place], [place] [address]          Radiology Studies: No results found.      Scheduled Meds: . Chlorhexidine Gluconate Cloth  6 each Topical Daily  . cloNIDine  0.2 mg Transdermal Weekly  .  hydrALAZINE  75 mg Oral TID  . insulin glargine  16 Units Subcutaneous [id number]  . latanoprost  1 drop Left Eye QHS  . mouth rinse  15 mL Mouth Rinse BID  . metoCLOPramide (REGLAN) injection  5 mg Intravenous Q6H  . pantoprazole (PROTONIX) IV  40 mg Intravenous Q24H  . sertraline  100 mg Oral q morning  . sodium bicarbonate  650 mg Oral TID  . traZODone  50 mg Oral QHS   Continuous Infusions: . dextrose 5% lactated ringers 125 mL/hr at 06/01/20 0859  . insulin 2.8 mL/hr at 06/01/20 0859  . lactated ringers Stopped (05/31/20 1624)  .  niCARDipine 15 mg/hr (06/01/20 0912)     LOS: 1 day    Time spent: 61 minutes    Irine Seal, MD [facility]   To contact the attending provider between 7A-7P or the covering provider during after hours 7P-7A, please log into the web site [email or web address] and access using universal [place] password for that web site. If you do not have the password, please call the hospital operator.  06/01/2020, 9:41 AM

## 2020-06-01 NOTE — Consult Note (Signed)
Renal Service Consult Note [facility]  Barbara Donovan 06/01/2020 Sol Blazing, MD Requesting Physician: Dr. Grandville [name]  Reason for Consult: Renal failure HPI: The patient is a 30 y.o. year-old w/ hx of  T1DM w/ retinopathy and diabetic nephropathy w/ progressive CKD, HTN who presented to ED on 05/31/20 w/ nausea and vomiting and was found to be in DKA w/ HTN'sive urgency. She was rx'd w/ IV cardene and insulin and admitted. Creat on admission was 6.4 yesterday, 7.2 last night and down to 7.0 this am. BUN 55. Blood sugars are now under 180 range, on admit BS was 265. Asked to see for renal failure.  UOP was nothing yesterday and 1000 cc UOP recorded so far today. I/O's so far are 4.7 L in and 1 L out. AG is down from 18 to 10 today and serum bicarb is up from 16 to 17.  Pt states that over the past 2-3 mos nausea and vomiting has been "a lot worse" than the prior 6 mos to 1 yr. +sig fatigue as well.   Pt has long hx of IDDM > 10 yrs and has had progressive renal failure worsening in 2021 and established care w/ Dr Joylene Grapes at [facility] last year. They have talked about doing PD and pt states today that is still her goal. Pt does not have PD or HD access.  ROS  denies CP  no joint pain   no HA  no blurry vision  no rash  no diarrhea  no nausea/ vomiting  no dysuria  no difficulty voiding  no change in urine color    Past Medical History  Past Medical History:  Diagnosis Date  . Asthma    as a child, no problems as an adult, no inhaler  . Cataract    NS OU  . Chronic hypertension during pregnancy, antepartum 08/19/2017  . Dehydration 01/28/2018  . Depression during pregnancy, antepartum 07/07/2017   6/20: Short trial of zoloft previously, reports didn't help much but also didn't give it a chance Discussed r/b/a SSRIs in pregnancy, agrees to try Zoloft again, rx sent No SI/HI/red flags  . Diabetes ([place])    TYPE I  . Diabetic retinopathy ([place]) 06/09/2017   07/2017 with  bilateral severe diabetic non-proliferative retinopathy with macular edema.  Marland Kitchen HTN (hypertension)   . Hypertensive retinopathy    OU  . Hypokalemia 01/22/2018  . Hypomagnesemia 01/28/2018  . Intractable nausea and vomiting 01/22/2018  . Intrauterine growth restriction (IUGR) affecting care of mother 12/22/2017  . Morbid obesity ([place])   . Nephropathy, diabetic ([place]) 12/29/2017  . Proteinuria due to type 1 diabetes mellitus ([place]) 11/02/2017   Baseline Pr: Cr 10.23  . Severe hyperemesis gravidarum 10/30/2017  . Type I diabetes mellitus ([place]) 07/07/2017   Current Diabetic Medications:  Insulin  [x]  Aspirin 81 mg daily after 12 weeks (? A2/B GDM)  Required Referrals for A1GDM or A2GDM: [x]  Diabetes Education and Testing Supplies [x]  Nutrition Cousult  For A2/B GDM or higher classes of DM [x]  Diabetes Education and Testing Supplies [x]  Nutrition Counsult [x]  Fetal ECHO after 20 weeks  [x]  Eye exam for retina evaluation - severe retinopathy 7/19  Base  . Ventricular septal defect (VSD) of fetus in singleton pregnancy, antepartum 09/30/2017   May go to newborn nursery per Dr. Lenard Simmer Echo prior to discharge   Past Surgical History  Past Surgical History:  Procedure Laterality Date  . [place] VITRECTOMY WITH 20 GAUGE MVR PORT  FOR MACULAR HOLE Left 07/20/2018   Procedure: 20 GAUGE PARS PLANA VITRECTOMY LEFT EYE;  Surgeon: Bernarda Caffey, MD;  Location: [place];  Service: Ophthalmology;  Laterality: Left;  . CESAREAN SECTION N/A 12/26/2017   Procedure: CESAREAN SECTION;  Surgeon: Osborne Oman, MD;  Location: [place];  Service: Obstetrics;  Laterality: N/A;  . EYE EXAMINATION UNDER ANESTHESIA Right 07/20/2018   Procedure: Eye Exam Under Anesthesia RIGHT EYE;  Surgeon: Bernarda Caffey, MD;  Location: [place];  Service: Ophthalmology;  Laterality: Right;  . EYE SURGERY Left 07/2018  . GAS INSERTION Left 07/19/2019   Procedure: INSERTION OF GAS;  Surgeon: Bernarda Caffey, MD;  Location: [place];   Service: Ophthalmology;  Laterality: Left;  SF6  . INJECTION OF SILICONE OIL Left 07/23/8113   Procedure: Injection Of Silicone Oil LEFT EYE;  Surgeon: Bernarda Caffey, MD;  Location: [place];  Service: Ophthalmology;  Laterality: Left;  . LASER PHOTO ABLATION Right 07/20/2018   Procedure: Laser Photo Ablation RIGHT EYE;  Surgeon: Bernarda Caffey, MD;  Location: [place];  Service: Ophthalmology;  Laterality: Right;  . MEMBRANE PEEL Left 07/20/2018   Procedure: Membrane Peel LEFT EYE;  Surgeon: Bernarda Caffey, MD;  Location: [place];  Service: Ophthalmology;  Laterality: Left;  . MEMBRANE PEEL Left 07/19/2019   Procedure: MEMBRANE PEEL;  Surgeon: Bernarda Caffey, MD;  Location: [place];  Service: Ophthalmology;  Laterality: Left;  . MITOMYCIN C APPLICATION Bilateral 07/19/6201   Procedure: Avastin Application;  Surgeon: Bernarda Caffey, MD;  Location: [place];  Service: Ophthalmology;  Laterality: Bilateral;  . PHOTOCOAGULATION WITH LASER Left 07/20/2018   Procedure: Photocoagulation With Laser LEFT EYE;  Surgeon: Bernarda Caffey, MD;  Location: [place];  Service: Ophthalmology;  Laterality: Left;  . PHOTOCOAGULATION WITH LASER Left 07/19/2019   Procedure: PHOTOCOAGULATION WITH LASER;  Surgeon: Bernarda Caffey, MD;  Location: [place];  Service: Ophthalmology;  Laterality: Left;  . RETINAL DETACHMENT SURGERY Left 07/20/2018   Dr. Bernarda Caffey  . SILICON OIL REMOVAL Left 07/19/2019   Procedure: 25g PARS PLANA VITRECTOMY WITH SILICON OIL REMOVAL;  Surgeon: Bernarda Caffey, MD;  Location: [place];  Service: Ophthalmology;  Laterality: Left;  . WISDOM TOOTH EXTRACTION     Family History  Family History  Problem Relation Age of Onset  . Diabetes Mother   . Aneurysm Mother 60  . Seizures Mother   . Diabetes Father   . Cataracts Father   . COPD Father   . Heart attack Father   . Heart disease Father   . Healthy Sister   . Healthy Daughter   . Stroke Maternal Grandfather   . Amblyopia Neg Hx   . Blindness Neg Hx   . Glaucoma Neg Hx    . Macular degeneration Neg Hx   . Retinal detachment Neg Hx   . Strabismus Neg Hx   . Retinitis pigmentosa Neg Hx   . Colon cancer Neg Hx   . Stomach cancer Neg Hx   . Esophageal cancer Neg Hx   . Pancreatic cancer Neg Hx   . Liver disease Neg Hx    Social History  reports that she has never smoked. She has never used smokeless tobacco. She reports previous alcohol use. She reports that she does not use drugs. Allergies No Known Allergies Home medications Prior to Admission medications   Medication Sig Start Date End Date Taking? Authorizing Provider  amLODipine (NORVASC) 10 MG tablet Take 1 tablet (10 mg total) by mouth daily. 03/09/20  Mercy Riding, MD  carvedilol (COREG) 25 MG tablet Take 1 tablet (25 mg total) by mouth 2 (two) times daily. 03/07/19   Imogene Burn, PA-C  cloNIDine (CATAPRES - DOSED IN MG/24 HR) 0.2 mg/24hr patch Place 1 patch (0.2 mg total) onto the skin once a week. 04/26/20   Eugenie Filler, MD  Continuous Blood Gluc Sensor MISC 1 each by Does not apply route as directed. Use as directed every 14 days. May dispense FreeStyle [employer] or similar. 01/27/20   Antonieta Pert, MD  hydrALAZINE (APRESOLINE) 25 MG tablet Take 3 tablets (75 mg total) by mouth 3 (three) times daily. 03/08/20   Mercy Riding, MD  insulin glargine (LANTUS SOLOSTAR) 100 UNIT/ML Solostar Pen Inject 16 Units into the skin every morning. 04/25/20   Renato Shin, MD  insulin lispro (HUMALOG KWIKPEN) 100 UNIT/ML KwikPen Inject 2 Units into the skin 3 (three) times daily with meals. 04/25/20   Renato Shin, MD  latanoprost (XALATAN) 0.005 % ophthalmic solution Place 1 drop into the left eye at bedtime. 02/28/20   Bernarda Caffey, MD  metoCLOPramide (REGLAN) 5 MG tablet Take 1 tablet (5 mg total) by mouth every 8 (eight) hours as needed for nausea or vomiting. 02/10/20   Mercy Riding, MD  multivitamin (RENA-VIT) TABS tablet Take 1 tablet by mouth at bedtime. 03/08/20   Mercy Riding, MD  pantoprazole  (PROTONIX) 40 MG tablet Take 1 tablet (40 mg total) by mouth daily. 01/31/20   Ladene Artist, MD  scopolamine (TRANSDERM-SCOP) 1 MG/3DAYS Place 1 patch (1.5 mg total) onto the skin every 3 (three) days. 04/13/20   Georgette Shell, MD  sertraline (ZOLOFT) 100 MG tablet Take 100 mg by mouth every morning. 02/15/20   [provider]  sodium bicarbonate 650 MG tablet Take 1 tablet (650 mg total) by mouth 3 (three) times daily. 04/22/20   Eugenie Filler, MD  traZODone (DESYREL) 50 MG tablet Take 50 mg by mouth at bedtime. 02/16/20   [provider]     Vitals:   06/01/20 0600 06/01/20 0630 06/01/20 0800 06/01/20 0905  BP: (!) 159/77 (!) 172/74 (!) 193/82 (!) 160/69  Pulse: (!) 128 (!) 120 (!) 120   Resp: (!) 21 17 16    Temp:   98.4 F (36.9 C)   TempSrc:   Axillary   SpO2: 99% 100% 100%   Weight:      Height:       Exam Gen alert, no distress, looks quite tired No rash, cyanosis or gangrene Sclera anicteric, throat clear  No jvd or bruits Chest clear bilat to bases, no rales/ wheezing RRR no MRG Abd soft ntnd no mass or ascites +bs GU normal MS no joint effusions or deformity Ext no LE or UE edema, no wounds or ulcers Neuro is alert, Ox 3, no asterixis       Home meds:  - norvasc 10/ coreg 25 bid/ clonidine patch 0.2 weekly/ hydralazine 75 tid  - insulin lantus 16u qam/ lispro 2u tid ac  - trazodone 50 hs/ zoloft 100 hs/ scopolamine 1.3m q3d  - protonix 40 qd/ reglan 529mtid prn n/v  - sod bicarb 650 tid  - prn's/ vitamins/ supplements     Date   Creat  eGFR    2019- 2020  0.60- 1.08    July-aug 2021 2.14- 2.48 26- 31, stage IV    Dec 2021  3.73- 4.81 12- 02 Feb 2020  4.11- 4.87    Feb 2022  4.39- 6.06 April 2020  5.51- 6.25     April 2-5, 2022 5.67 >> 5.01 9- 11, stage V    May 31, 2020 7.02  8    Jun 01, 2020 7.05  8       UA 5/15 - prot >300, 6-10 wbc, 0-5 rbc/ epi, rare bact    BP's high on admit 190/ 70 range, still high    HR's  120, RR 18, temp afeb RA 100%    Na 144  K 3.6  CO2 17  AG 10  WBC 12K Hb 10.0     CXR this am - no acute disease, reading is pending  Assessment/ Plan: 1. CKD stage V - with worsening renal function and uremic symptoms (refractory nausea/ vomiting). eGFR down to 8 ml/min. Will need to start on dialysis. She wants to do PD ultimately. No access currently.  Recommend transfer her to [facility]. We will consult IR for HD access and plan to start HD after access has been established.  2. HTN/ volume - BP's high, no vol excess on exam, looks a bit dry. Not able to keep BP pills down, getting IV cardene, topical clonidine and IV meds prn.  Will run NS at 75 cc/hr for now. Since not keeping food down. 3. DM1/ gastroparesis - per primary team, BS's < 200 now. If needs dextrose IV for DM use D5W please, or change NS at 75 to D5NS at 75/hr.  4. Anemia ckd - Hb is 10.0, follow. Check tsat.  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 will check phos.       Kelly Splinter  MD 06/01/2020, 9:44 AM  Recent Labs  Lab 05/31/20 1223 06/01/20 0836  WBC 10.2 12.5*  HGB 11.8* 10.0*   Recent Labs  Lab 06/01/20 0348 06/01/20 0720  K 3.8 3.6  BUN 57* 55*  CREATININE 7.23* 7.05*  CALCIUM 9.5 9.1

## 2020-06-01 NOTE — Progress Notes (Signed)
Called Lab to check on BMET that was drawn at 1235 AM because it still says "in process" and has no new results. Lab states that they are working on it. Will continue to monitor.

## 2020-06-01 NOTE — Progress Notes (Signed)
Pt's belongings packed up. Pt stable. Pt picked up by [facility] to transfer to [facility].

## 2020-06-02 ENCOUNTER — Inpatient Hospital Stay (HOSPITAL_COMMUNITY): Payer: 59

## 2020-06-02 DIAGNOSIS — E1010 Type 1 diabetes mellitus with ketoacidosis without coma: Secondary | ICD-10-CM | POA: Diagnosis not present

## 2020-06-02 DIAGNOSIS — F32A Depression, unspecified: Secondary | ICD-10-CM | POA: Diagnosis not present

## 2020-06-02 DIAGNOSIS — D631 Anemia in chronic kidney disease: Secondary | ICD-10-CM

## 2020-06-02 DIAGNOSIS — N185 Chronic kidney disease, stage 5: Secondary | ICD-10-CM | POA: Diagnosis not present

## 2020-06-02 DIAGNOSIS — I10 Essential (primary) hypertension: Secondary | ICD-10-CM | POA: Diagnosis not present

## 2020-06-02 HISTORY — PX: IR US GUIDE VASC ACCESS RIGHT: IMG2390

## 2020-06-02 HISTORY — PX: IR FLUORO GUIDE CV LINE RIGHT: IMG2283

## 2020-06-02 LAB — CBC WITH DIFFERENTIAL/PLATELET
Abs Immature Granulocytes: 0.05 10*3/uL (ref 0.00–0.07)
Basophils Absolute: 0 10*3/uL (ref 0.0–0.1)
Basophils Relative: 0 %
Eosinophils Absolute: 0 10*3/uL (ref 0.0–0.5)
Eosinophils Relative: 0 %
HCT: 28.6 % — ABNORMAL LOW (ref 36.0–46.0)
Hemoglobin: 9.3 g/dL — ABNORMAL LOW (ref 12.0–15.0)
Immature Granulocytes: 0 %
Lymphocytes Relative: 20 %
Lymphs Abs: 2.7 10*3/uL (ref 0.7–4.0)
MCH: 26 pg (ref 26.0–34.0)
MCHC: 32.5 g/dL (ref 30.0–36.0)
MCV: 79.9 fL — ABNORMAL LOW (ref 80.0–100.0)
Monocytes Absolute: 1 10*3/uL (ref 0.1–1.0)
Monocytes Relative: 7 %
Neutro Abs: 9.8 10*3/uL — ABNORMAL HIGH (ref 1.7–7.7)
Neutrophils Relative %: 73 %
Platelets: 492 10*3/uL — ABNORMAL HIGH (ref 150–400)
RBC: 3.58 MIL/uL — ABNORMAL LOW (ref 3.87–5.11)
RDW: 15.4 % (ref 11.5–15.5)
WBC: 13.6 10*3/uL — ABNORMAL HIGH (ref 4.0–10.5)
nRBC: 0 % (ref 0.0–0.2)

## 2020-06-02 LAB — RENAL FUNCTION PANEL
Albumin: 2.5 g/dL — ABNORMAL LOW (ref 3.5–5.0)
Anion gap: 12 (ref 5–15)
BUN: 45 mg/dL — ABNORMAL HIGH (ref 6–20)
CO2: 17 mmol/L — ABNORMAL LOW (ref 22–32)
Calcium: 8.5 mg/dL — ABNORMAL LOW (ref 8.9–10.3)
Chloride: 109 mmol/L (ref 98–111)
Creatinine, Ser: 6.78 mg/dL — ABNORMAL HIGH (ref 0.44–1.00)
GFR, Estimated: 8 mL/min — ABNORMAL LOW (ref >60)
Glucose, Bld: 173 mg/dL — ABNORMAL HIGH (ref 70–99)
Phosphorus: 5.6 mg/dL — ABNORMAL HIGH (ref 2.5–4.6)
Potassium: 3.8 mmol/L (ref 3.5–5.1)
Sodium: 138 mmol/L (ref 135–145)

## 2020-06-02 LAB — GLUCOSE, CAPILLARY
Glucose-Capillary: 226 mg/dL — ABNORMAL HIGH (ref 70–99)
Glucose-Capillary: 141 mg/dL — ABNORMAL HIGH (ref 70–99)
Glucose-Capillary: 158 mg/dL — ABNORMAL HIGH (ref 70–99)
Glucose-Capillary: 124 mg/dL — ABNORMAL HIGH (ref 70–99)

## 2020-06-02 LAB — BASIC METABOLIC PANEL
Anion gap: 11 (ref 5–15)
BUN: 43 mg/dL — ABNORMAL HIGH (ref 6–20)
CO2: 17 mmol/L — ABNORMAL LOW (ref 22–32)
Calcium: 8.4 mg/dL — ABNORMAL LOW (ref 8.9–10.3)
Chloride: 111 mmol/L (ref 98–111)
Creatinine, Ser: 7 mg/dL — ABNORMAL HIGH (ref 0.44–1.00)
GFR, Estimated: 8 mL/min — ABNORMAL LOW (ref >60)
Glucose, Bld: 178 mg/dL — ABNORMAL HIGH (ref 70–99)
Potassium: 3.9 mmol/L (ref 3.5–5.1)
Sodium: 139 mmol/L (ref 135–145)

## 2020-06-02 LAB — HEPATITIS B SURFACE ANTIBODY,QUALITATIVE: Hep B S Ab: REACTIVE — AB

## 2020-06-02 LAB — HEPATITIS B CORE ANTIBODY, IGM: Hep B C IgM: NONREACTIVE

## 2020-06-02 LAB — HEPATITIS B SURFACE ANTIGEN: Hepatitis B Surface Ag: NONREACTIVE

## 2020-06-02 LAB — IRON AND TIBC
Iron: 133 ug/dL (ref 28–170)
Saturation Ratios: 54 % — ABNORMAL HIGH (ref 10.4–31.8)
TIBC: 246 ug/dL — ABNORMAL LOW (ref 250–450)
UIBC: 113 ug/dL

## 2020-06-02 IMAGING — XA IR FLUORO GUIDE CV LINE*R*
2 series · 2 of 2 positions shown · non-contrast
Comparison: none

INDICATION: 29-year-old with renal failure and needs hemodialysis catheter.

[Series 1: fl (-) angio · 1 of 1 slices shown]
[im 1/1]
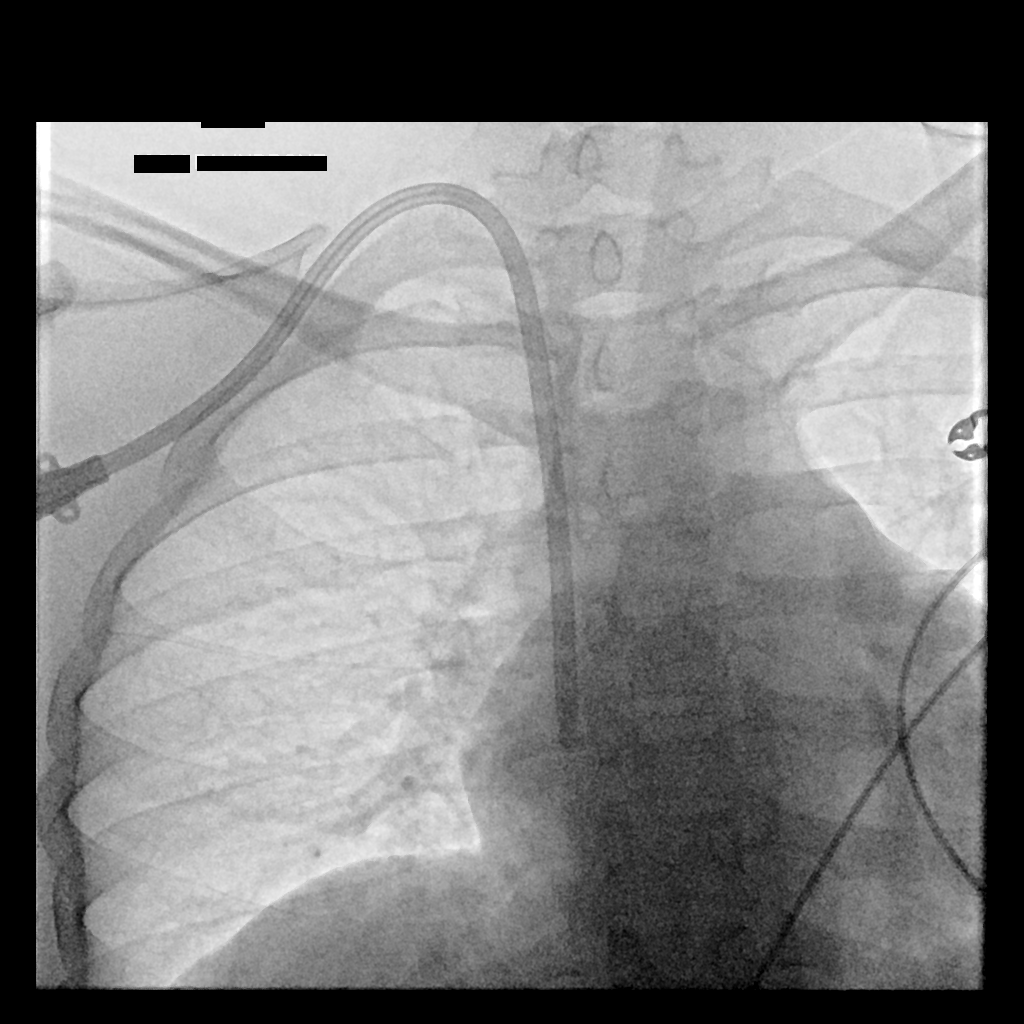

[Series 1: ir fluoro guide cv line*right* · 1 of 1 slices shown]
[im 1/1]
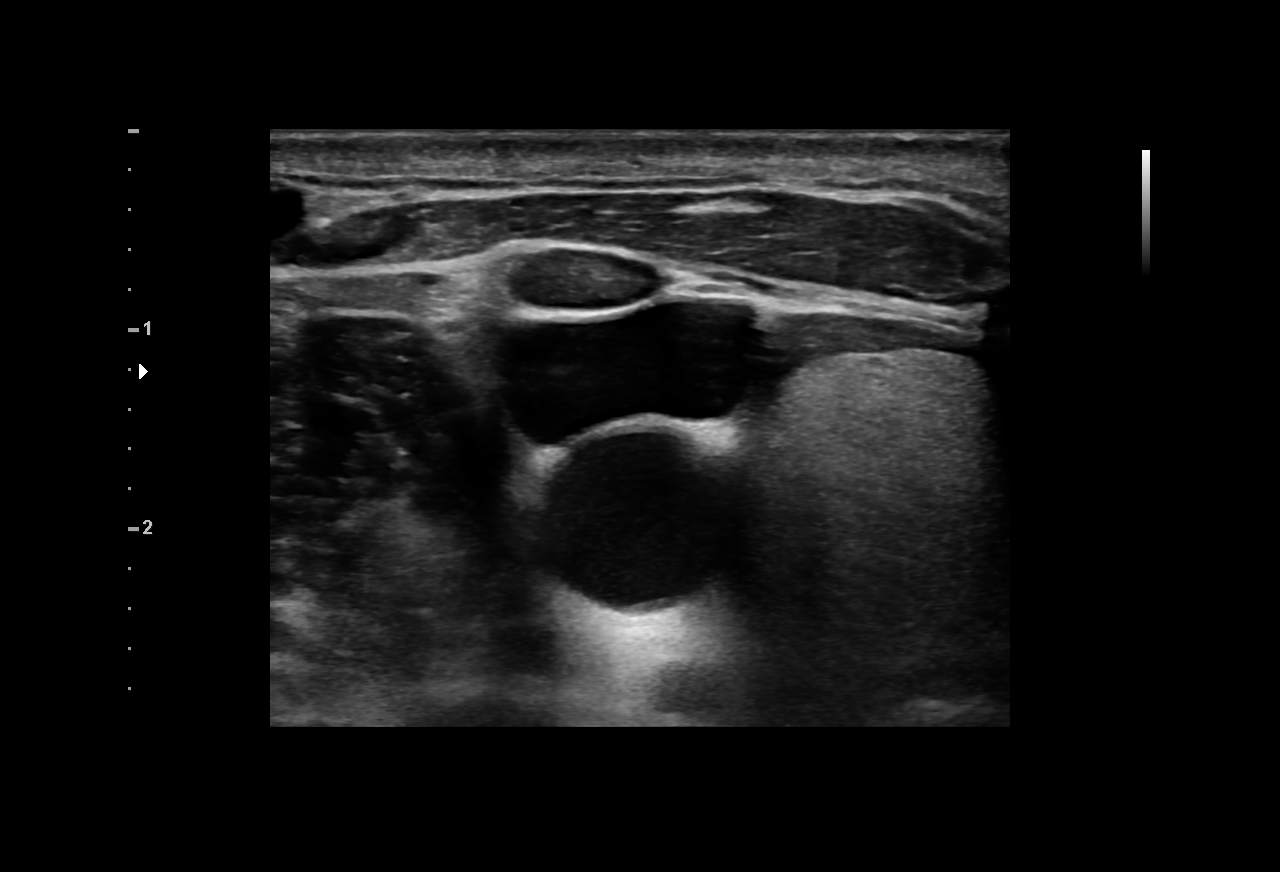

[2 of 2 positions shown; findings below may reference images not displayed]

EXAM:
FLUOROSCOPIC AND ULTRASOUND GUIDED PLACEMENT OF A TUNNELED DIALYSIS
CATHETER

MEDICATIONS:
Ancef 2 g; The antibiotic was administered within an appropriate
time interval prior to skin puncture.

ANESTHESIA/SEDATION:
Versed 1.0 mg IV; Fentanyl 50 mcg IV;

Moderate Sedation Time:  28 minutes

The patient was continuously monitored during the procedure by the
interventional radiology nurse under my direct supervision.

FLUOROSCOPY TIME:  42 seconds, 2 mGy

COMPLICATIONS:
None immediate.

PROCEDURE:
The procedure was explained to the patient. The risks and benefits
of the procedure were discussed and the patient's questions were
addressed. Informed consent was obtained from the patient. The
patient was placed supine on the interventional table. Ultrasound
confirmed a patent right internal jugular vein. Ultrasound images
were obtained for documentation. The right neck and chest was
prepped and draped in a sterile fashion. Maximal barrier sterile
technique was utilized including caps, mask, sterile gowns, sterile
gloves, sterile drape, hand hygiene and skin antiseptic. The right
neck was anesthetized with 1% lidocaine. A small incision was made
with #11 blade scalpel. A 21 gauge needle directed into the right
internal jugular vein with ultrasound guidance. A micropuncture
dilator set was placed. A 19 cm tip to cuff Palindrome catheter was
selected. The skin below the right clavicle was anesthetized and a
small incision was made with an #11 blade scalpel. A subcutaneous
tunnel was formed to the vein dermatotomy site. The catheter was
brought through the tunnel. The vein dermatotomy site was dilated to
accommodate a peel-away sheath. The catheter was placed through the
peel-away sheath and directed into the central venous structures.
The tip of the catheter was placed at the superior cavoatrial
junction with fluoroscopy. Fluoroscopic images were obtained for
documentation. Both lumens were found to aspirate and flush well.
The proper amount of heparin was flushed in both lumens. The vein
dermatotomy site was closed using a single layer of absorbable
suture and Dermabond. Surgifoam was placed in the subcutaneous
tract. The catheter was secured to the skin using Prolene suture.
IMPRESSION: Successful placement of a right jugular tunneled dialysis catheter
using ultrasound and fluoroscopic guidance.

## 2020-06-02 MED ORDER — GELATIN ABSORBABLE 12-7 MM EX MISC
CUTANEOUS | Status: AC
  Filled 2020-06-02: qty 1

## 2020-06-02 MED ORDER — HYDROMORPHONE HCL 2 MG PO TABS
2.0000 mg | ORAL_TABLET | ORAL | Status: DC | PRN
  Administered 2020-06-02 – 2020-06-03 (×2): 2 mg via ORAL
  Filled 2020-06-02 (×2): qty 1

## 2020-06-02 MED ORDER — FENTANYL CITRATE (PF) 100 MCG/2ML IJ SOLN
INTRAMUSCULAR | Status: AC
  Filled 2020-06-02: qty 2

## 2020-06-02 MED ORDER — HYDRALAZINE HCL 50 MG PO TABS
100.0000 mg | ORAL_TABLET | Freq: Three times a day (TID) | ORAL | Status: DC
  Administered 2020-06-02 – 2020-06-09 (×18): 100 mg via ORAL
  Filled 2020-06-02 (×18): qty 2

## 2020-06-02 MED ORDER — LIDOCAINE HCL 1 % IJ SOLN
INTRAMUSCULAR | Status: AC
  Administered 2020-06-02: 10 mL via SUBCUTANEOUS
  Filled 2020-06-02: qty 20

## 2020-06-02 MED ORDER — MIDAZOLAM HCL 2 MG/2ML IJ SOLN
INTRAMUSCULAR | Status: AC
  Filled 2020-06-02: qty 2

## 2020-06-02 MED ORDER — HEPARIN SODIUM (PORCINE) 1000 UNIT/ML IJ SOLN
INTRAMUSCULAR | Status: AC
  Administered 2020-06-02: 4 [IU] via INTRAVENOUS_CENTRAL
  Filled 2020-06-02: qty 1

## 2020-06-02 MED ORDER — LIDOCAINE-EPINEPHRINE 1 %-1:100000 IJ SOLN
INTRAMUSCULAR | Status: AC
  Administered 2020-06-02: 10 mL via SUBCUTANEOUS
  Filled 2020-06-02: qty 1

## 2020-06-02 MED ORDER — MIDAZOLAM HCL 2 MG/2ML IJ SOLN
INTRAMUSCULAR | Status: AC | PRN
  Administered 2020-06-02: 1 mg via INTRAVENOUS

## 2020-06-02 MED ORDER — FENTANYL CITRATE (PF) 100 MCG/2ML IJ SOLN
INTRAMUSCULAR | Status: AC | PRN
  Administered 2020-06-02: 50 ug via INTRAVENOUS

## 2020-06-02 MED ORDER — CARVEDILOL 25 MG PO TABS
25.0000 mg | ORAL_TABLET | Freq: Two times a day (BID) | ORAL | Status: DC
  Administered 2020-06-02 – 2020-06-09 (×14): 25 mg via ORAL
  Filled 2020-06-02 (×14): qty 1

## 2020-06-02 MED ORDER — CEFAZOLIN SODIUM-DEXTROSE 2-4 GM/100ML-% IV SOLN
INTRAVENOUS | Status: AC
  Administered 2020-06-02: 2 g via INTRAVENOUS
  Filled 2020-06-02: qty 100

## 2020-06-02 NOTE — Progress Notes (Signed)
[facility] Progress Note  Subjective: seen in room, nausea persists, keeping some BP pills down. Awaiting TDC placement.   Vitals:   06/02/20 1430 06/02/20 1500 06/02/20 1530 06/02/20 1600  BP: (!) 168/95 (!) 162/89 (!) 141/79 126/73  Pulse: (!) 114 (!) 113 93 88  Resp: 14 13 17 18   Temp:      TempSrc:      SpO2: 98% 99% 96% 94%  Weight:      Height:        Exam:  alert, nad   no jvd  Chest cta bilat  Cor reg no RG  Abd soft ntnd no ascites   Ext no LE edema   Alert, NF, ox3, no asterixis   Home meds:  - norvasc 10/ coreg 25 bid/ clonidine patch 0.2 weekly/ hydralazine 75 tid  - insulin lantus 16u qam/ lispro 2u tid ac  - trazodone 50 hs/ zoloft 100 hs/ scopolamine 1.43m q3d  - protonix 40 qd/ reglan 554mtid prn n/v  - sod bicarb 650 tid  - prn's/ vitamins/ supplements     Date                          Creat               eGFR    2019- 2020               0.60- 1.08    July-aug 2021          2.14- 2.48        26- 31, stage IV    Dec 2021                  3.73- 4.81        12- 02 Feb 2020                  4.11- 4.87    Feb 2022                  4.39- 6.06 April 2020              5.51- 6.25            April 2-5, 2022         5.67 >> 5.01        May 31, 2020           7.02                 8    Jun 01, 2020           7.05                 8                            CXR this am - no acute disease, reading is pending  Assessment/ Plan: 1. CKD stage V - with worsening renal function and uremic symptoms. eGFR down to 8 ml/min. Needs to start on HD. [facility]. Plan 1st HD tomorrow (or possibly tonight if off of ICU meds).  2. HTN/ volume - BP's high, looks euvolemic and I/O's are heavy+, will dc IVF"s. Cont catapres patch 2 and po hydralazine 100 tid.  3. DM1/ gastroparesis - per primary team, BS's < 200 now.  4. Anemia ckd - Hb is 10.0, follow. Check tsat.  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 will check  phos.       Rob Florabel Faulks 06/02/2020, 4:09 PM   Recent Labs  Lab 06/01/20 0836 06/01/20 1245 06/02/20 0336 06/02/20 0848  K  --   --  3.8 3.9  BUN  --   --  45* 43*  CREATININE  --   --  6.78* 7.00*  CALCIUM  --   --  8.5* 8.4*  PHOS  --  3.3 5.6*  --   HGB 10.0*  --  9.3*  --    Inpatient medications: . Chlorhexidine Gluconate Cloth  6 each Topical Daily  . Chlorhexidine Gluconate Cloth  6 each Topical Q0600  . cloNIDine  0.2 mg Transdermal Weekly  . heparin injection (subcutaneous)  5,000 Units Subcutaneous Q8H  . hydrALAZINE  100 mg Oral TID  . insulin aspart  0-6 Units Subcutaneous TID WC  . insulin glargine  16 Units Subcutaneous BH-q7a  . latanoprost  1 drop Left Eye QHS  . mouth rinse  15 mL Mouth Rinse BID  . metoCLOPramide (REGLAN) injection  5 mg Intravenous Q6H  . pantoprazole (PROTONIX) IV  40 mg Intravenous Q24H  . sertraline  100 mg Oral q morning  . sodium bicarbonate  650 mg Oral TID  . traZODone  50 mg Oral QHS   . sodium chloride 85 mL/hr at 06/02/20 1600  . niCARDipine 5 mg/hr (06/02/20 1600)   dextrose, HYDROmorphone, prochlorperazine

## 2020-06-02 NOTE — Progress Notes (Signed)
Inpatient Diabetes Program Recommendations  AACE/ADA: New Consensus Statement on Inpatient Glycemic Control (2015)  Target Ranges:  Prepandial:   less than 140 mg/dL      Peak postprandial:   less than 180 mg/dL (1-2 hours)      Critically ill patients:  140 - 180 mg/dL   Results for KASHYRA, BONTEMPO (MRN [id number]) as of 06/02/2020 06:56  Ref. Range 06/01/2020 08:03 06/01/2020 08:48 06/01/2020 10:49 06/01/2020 11:59 06/01/2020 12:26 06/01/2020 16:25 06/01/2020 19:57  Glucose-Capillary Latest Ref Range: 70 - 99 mg/dL 167 (H) 178 (H) 137 (H)  16 units LANTUS '@10'$ :38am 150 (H) 178 (H)  1 unit NOVOLOG  IV Insulin Drip Stopped 263 (H)  3 units NOVOLOG  187 (H)   Results for SHADE, HARDING (MRN [id number]) as of 06/02/2020 06:56  Ref. Range 06/02/2020 06:31  Glucose-Capillary Latest Ref Range: 70 - 99 mg/dL 226 (H)  2 units NOVOLOG  16 units LANTUS   Results for ANNIQUE, STICKER (MRN [id number]) as of 06/02/2020 06:56  Ref. Range 05/31/2020 16:44  Hemoglobin A1C Latest Ref Range: 4.8 - 5.6 % 7.5 (H)    Admit DKA/ Hypertensive Emergency  History of Type 1 Diabetes, CKD5  Home DM Meds: Lantus 16 units Daily       Humalog 2 units TID with meals  Current Orders: Lantus 16 units Daily      Novolog 0-6 units TID    MD- Note CBG 226 this AM  Please consider slight increase of Lantus to 18 units Daily  Since 16 unit dose already given this AM, please consider ordering Lantus 2 units X 1 dose to be given today as well     --Will follow patient during hospitalization--  Wyn Quaker RN, MSN, CDE Diabetes Coordinator Inpatient Glycemic Control Team Team Pager: [phone] (8a-5p)

## 2020-06-02 NOTE — Procedures (Signed)
Interventional Radiology Procedure:   Indications: Chronic kidney disease, stage V  Procedure: Tunneled dialysis catheter placement  Findings: Right jugular Palindrome, tip at SVC/RA junction  Complications: None     EBL: less than 10 ml  Plan: HD catheter is ready to use.   Tonatiuh Mallon R. Anselm Pancoast, MD  Pager: [phone]

## 2020-06-02 NOTE — Progress Notes (Addendum)
Pt arrived to [facility] CVICU via [place]. On Cardene gtt and NS gtt. DKA resolved, now on Lantus QD + SSI ACHS. Denies N/V/P/HA/SOB, endorses fatigue. BP ~130s/80s, SR-ST 90s-120s. Scant insp wheezes, no distress. Pt appears comfortable, resting.  Per IR note, plan is to place [facility] 5/16 so pt can begin iHD. Consent already in chart.

## 2020-06-02 NOTE — Plan of Care (Signed)
  Problem: Education: Goal: Knowledge of General Education information will improve Description: Including pain rating scale, medication(s)/side effects and non-pharmacologic comfort measures Outcome: Progressing   Problem: Health Behavior/Discharge Planning: Goal: Ability to manage health-related needs will improve Outcome: Progressing   Problem: Clinical Measurements: Goal: Ability to maintain clinical measurements within normal limits will improve Outcome: Progressing Goal: Will remain free from infection Outcome: Progressing Goal: Diagnostic test results will improve Outcome: Progressing Goal: Respiratory complications will improve Outcome: Progressing Goal: Cardiovascular complication will be avoided Outcome: Progressing   Problem: Activity: Goal: Risk for activity intolerance will decrease Outcome: Progressing   Problem: Nutrition: Goal: Adequate nutrition will be maintained Outcome: Progressing   Problem: Coping: Goal: Level of anxiety will decrease Outcome: Progressing   Problem: Elimination: Goal: Will not experience complications related to bowel motility Outcome: Progressing Goal: Will not experience complications related to urinary retention Outcome: Progressing   Problem: Pain Managment: Goal: General experience of comfort will improve Outcome: Progressing   Problem: Safety: Goal: Ability to remain free from injury will improve Outcome: Progressing   Problem: Skin Integrity: Goal: Risk for impaired skin integrity will decrease Outcome: Progressing   Problem: Education: Goal: Ability to describe self-care measures that may prevent or decrease complications (Diabetes Survival Skills Education) will improve Outcome: Progressing Goal: Individualized Educational Video(s) Outcome: Progressing   Problem: Coping: Goal: Ability to adjust to condition or change in health will improve Outcome: Progressing   Problem: Fluid Volume: Goal: Ability to  maintain a balanced intake and output will improve Outcome: Progressing   Problem: Health Behavior/Discharge Planning: Goal: Ability to identify and utilize available resources and services will improve Outcome: Progressing Goal: Ability to manage health-related needs will improve Outcome: Progressing   Problem: Metabolic: Goal: Ability to maintain appropriate glucose levels will improve Outcome: Progressing   Problem: Nutritional: Goal: Maintenance of adequate nutrition will improve Outcome: Progressing Goal: Progress toward achieving an optimal weight will improve Outcome: Progressing   Problem: Skin Integrity: Goal: Risk for impaired skin integrity will decrease Outcome: Progressing   Problem: Tissue Perfusion: Goal: Adequacy of tissue perfusion will improve Outcome: Progressing   Problem: Education: Goal: Knowledge of disease and its progression will improve Outcome: Progressing Goal: Individualized Educational Video(s) Outcome: Progressing   Problem: Fluid Volume: Goal: Compliance with measures to maintain balanced fluid volume will improve Outcome: Progressing   Problem: Health Behavior/Discharge Planning: Goal: Ability to manage health-related needs will improve Outcome: Progressing   Problem: Nutritional: Goal: Ability to make healthy dietary choices will improve Outcome: Progressing   Problem: Clinical Measurements: Goal: Complications related to the disease process, condition or treatment will be avoided or minimized Outcome: Progressing   

## 2020-06-02 NOTE — Progress Notes (Signed)
PROGRESS NOTE  KHLOE RENSCH  [id number] DOB: [birth year] DOA: 05/31/2020 PCP: Fanny Bien, MD   Brief Narrative: Barbara Donovan is a 30 y.o. female with a history of T2DM, HTN, and stage V CKD who presented to the ED 5/14 with nausea and vomiting found to be in DKA with hypertensive urgency as well as acute on chronic renal failure. Nicardipine infusion and insulin infusions started, and patient admitted with nephrology recommending transfer to [facility] for initiation of iHD. TDC was placed 5/16 with plans for HD 5/17. Insulin infusion has been transitioned to basal-bolus insulin, though nicardipine drip continues.  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  - HD to start 5/17 per nephrology once hemodynamically more stable.  - DC IVF per nephrology  NAGMA:  - Continue bicarbonate  AOCKD:  - Iron studies, monitor for transfusion.  T1DM with DKA POA: DKA resolved. HbA1c 7.5%. - Continue lantus 16u qAM, very sensitive SSI. At inpatient goal - Carb-mod diet  HTN urgency:  - Initially weaned from nicardipine, but has required reinitiation now back to '5mg'$ /hr.  - Continue clonidine patch - Will increase hydralazine to '100mg'$  TID with goal of continued measured BP reduction. - Plan to restart coreg this PM.   Depression:  - Continue SSRI  DVT prophylaxis: Heparin Code Status: Full Family Communication: None at bedside Disposition Plan:  Status is: Inpatient - Remains inpatient appropriate because:Inpatient level of care appropriate due  to severity of illness  Dispo: The patient is from: Home              Anticipated d/c is to: Home              Patient currently is not medically stable to d/c.   Difficult to place patient No  Consultants:   Nephrology  IR  Procedures:   Right IJ TDC placement 06/02/2020  Antimicrobials:  None   Subjective: Seen after [facility] placement, complaining of right shoulder discomfort, nothing ordered for pain at that time. No dyspnea, chest pain, palpitations, leg swelling. Hungry.  Objective: Vitals:   06/02/20 1500 06/02/20 1530 06/02/20 1600 06/02/20 1623  BP: (!) 162/89 (!) 141/79 126/73 (!) 151/83  Pulse: (!) 113 93 88   Resp: '13 17 18   '$ Temp:   98.9 F (37.2 C)   TempSrc:   Oral   SpO2: 99% 96% 94%   Weight:      Height:        Intake/Output Summary (Last 24 hours) at 06/02/2020 1631 Last data filed at 06/02/2020 1600 Gross per 24 hour  Intake 3888.93 ml  Output 850 ml  Net 3038.93 ml   Filed Weights   05/31/20 1220 05/31/20 1644 06/02/20 0630  Weight: 73.9 kg 66.4 kg 73.9 kg    Gen: 30 y.o. female in no distress Pulm: Non-labored breathing room air. Clear to auscultation bilaterally.  CV: Regular borderline tachycardia. No murmur, rub, or gallop. No JVD, no pitting pedal edema. GI: Abdomen soft, non-tender, non-distended, with normoactive bowel sounds. No organomegaly or masses felt.  Ext: Warm, no deformities Skin: Right IJ TDC site is c/d/i, otherwise no rashes, lesions or ulcers Neuro: Alert and oriented. No focal neurological deficits. Psych: Judgement and insight appear normal. Mood & affect appropriate.   Data Reviewed: I have personally reviewed following labs and imaging studies  CBC: Recent Labs  Lab 05/31/20 1223 06/01/20 0836 06/02/20 0336  WBC 10.2 12.5* 13.6*  NEUTROABS  --  9.4* 9.8*  HGB 11.8* 10.0* 9.3*  HCT 37.0 31.9* 28.6*  MCV 79.9* 80.6 79.9*  PLT 577* 541* [id number]*   Basic Metabolic Panel: Recent Labs  Lab 06/01/20 0035  06/01/20 0348 06/01/20 0720 06/01/20 1245 06/02/20 0336 06/02/20 0848  NA 144 146* 144  --  138 139  K 3.6 3.8 3.6  --  3.8 3.9  CL 114* 116* 117*  --  109 111  CO2 17* 18* 17*  --  17* 17*  GLUCOSE 180* 167* 179*  --  173* 178*  BUN 59* 57* 55*  --  45* 43*  CREATININE 7.13* 7.23* 7.05*  --  6.78* 7.00*  CALCIUM 9.6 9.5 9.1  --  8.5* 8.4*  PHOS  --   --   --  3.3 5.6*  --    GFR: Estimated Creatinine Clearance: 12.2 mL/min (A) (by C-G formula based on SCr of 7 mg/dL (H)). Liver Function Tests: Recent Labs  Lab 05/31/20 1223 06/02/20 0336  AST 19  --   ALT 14  --   ALKPHOS 67  --   BILITOT 0.7  --   PROT 8.0  --   ALBUMIN 3.3* 2.5*   Recent Labs  Lab 05/31/20 1223  LIPASE 35   No results for input(s): AMMONIA in the last 168 hours. Coagulation Profile: No results for input(s): INR, PROTIME in the last 168 hours. Cardiac Enzymes: No results for input(s): CKTOTAL, CKMB, CKMBINDEX, TROPONINI in the last 168 hours. BNP (last 3 results) No results for input(s): PROBNP in the last 8760 hours. HbA1C: Recent Labs    05/31/20 1644  HGBA1C 7.5*   CBG: Recent Labs  Lab 06/01/20 1625 06/01/20 1957 06/02/20 0631 06/02/20 1300 06/02/20 1602  GLUCAP 263* 187* 226* 141* 158*   Lipid Profile: No results for input(s): CHOL, HDL, LDLCALC, TRIG, CHOLHDL, LDLDIRECT in the last 72 hours. Thyroid Function Tests: No results for input(s): TSH, T4TOTAL, FREET4, T3FREE, THYROIDAB in the last 72 hours. Anemia Panel: Recent Labs    06/02/20 0336  TIBC 246*  IRON 133   Urine analysis:    Component Value Date/Time   COLORURINE YELLOW 06/01/2020 0530   APPEARANCEUR HAZY (A) 06/01/2020 0530   LABSPEC 1.012 06/01/2020 0530   PHURINE 6.0 06/01/2020 0530   GLUCOSEU >=500 (A) 06/01/2020 0530   HGBUR NEGATIVE 06/01/2020 0530   BILIRUBINUR NEGATIVE 06/01/2020 0530   KETONESUR 20 (A) 06/01/2020 0530   PROTEINUR >=300 (A) 06/01/2020 0530   UROBILINOGEN 0.2 08/14/2019 1051    NITRITE NEGATIVE 06/01/2020 0530   LEUKOCYTESUR NEGATIVE 06/01/2020 0530   Recent Results (from the past 240 hour(s))  MRSA PCR Screening     Status: None   Collection Time: 05/31/20  4:13 PM   Specimen: Nasal Mucosa; Nasopharyngeal  Result Value Ref Range Status   MRSA by PCR NEGATIVE NEGATIVE Final    Comment:        The GeneXpert MRSA Assay (FDA approved for NASAL specimens only), is one component of a comprehensive MRSA colonization surveillance program. It is not intended to diagnose MRSA infection nor to  guide or monitor treatment for MRSA infections. Performed at [facility], [place] [address], [place], [place] [address]   Resp Panel by RT-PCR (Flu A&B, Covid) Nasopharyngeal Swab     Status: None   Collection Time: 05/31/20  7:00 PM   Specimen: Nasopharyngeal Swab; Nasopharyngeal(NP) swabs in vial transport medium  Result Value Ref Range Status   SARS Coronavirus 2 by RT PCR NEGATIVE NEGATIVE Final    Comment: (NOTE) SARS-CoV-2 target nucleic acids are NOT DETECTED.  The SARS-CoV-2 RNA is generally detectable in upper respiratory specimens during the acute phase of infection. The lowest concentration of SARS-CoV-2 viral copies this assay can detect is 138 copies/mL. A negative result does not preclude SARS-Cov-2 infection and should not be used as the sole basis for treatment or other patient management decisions. A negative result may occur with  improper specimen collection/handling, submission of specimen other than nasopharyngeal swab, presence of viral mutation(s) within the areas targeted by this assay, and inadequate number of viral copies(<138 copies/mL). A negative result must be combined with clinical observations, patient history, and epidemiological information. The expected result is Negative.  Fact Sheet for Patients:  EntrepreneurPulse.com.au  Fact Sheet for Healthcare Providers:   IncredibleEmployment.be  This test is no t yet approved or cleared by the [place] FDA and  has been authorized for detection and/or diagnosis of SARS-CoV-2 by FDA under an Emergency Use Authorization (EUA). This EUA will remain  in effect (meaning this test can be used) for the duration of the COVID-19 declaration under Section 564(b)(1) of the Act, 21 U.S.C.section 360bbb-3(b)(1), unless the authorization is terminated  or revoked sooner.       Influenza A by PCR NEGATIVE NEGATIVE Final   Influenza B by PCR NEGATIVE NEGATIVE Final    Comment: (NOTE) The Xpert Xpress SARS-CoV-2/FLU/RSV plus assay is intended as an aid in the diagnosis of influenza from Nasopharyngeal swab specimens and should not be used as a sole basis for treatment. Nasal washings and aspirates are unacceptable for Xpert Xpress SARS-CoV-2/FLU/RSV testing.  Fact Sheet for Patients: EntrepreneurPulse.com.au  Fact Sheet for Healthcare Providers: IncredibleEmployment.be  This test is not yet approved or cleared by the [place] FDA and has been authorized for detection and/or diagnosis of SARS-CoV-2 by FDA under an Emergency Use Authorization (EUA). This EUA will remain in effect (meaning this test can be used) for the duration of the COVID-19 declaration under Section 564(b)(1) of the Act, 21 U.S.C. section 360bbb-3(b)(1), unless the authorization is terminated or revoked.  Performed at [facility], [place] [address] [place], [place] [address]       Radiology Studies: US RENAL  Result Date: 06/01/2020 CLINICAL DATA:  Acute renal failure EXAM: RENAL / URINARY TRACT ULTRASOUND COMPLETE COMPARISON:  Ultrasound kidneys 03/06/2020 FINDINGS: Right Kidney: Renal measurements: 11.3 x 5.4 x 6.4 cm = volume: 202 mL. Diffuse increased echogenicity the renal cortex. No hydronephrosis. No mass. Left Kidney: Renal measurements: 11.9 x 8.0 x  7.0 cm = volume: 348 mL. Diffuse increased echogenicity the renal cortex. No hydronephrosis. No mass. Bladder: Appears normal for degree of bladder distention. Other: None. IMPRESSION: Diffuse increased echogenicity of the renal cortices without significant thinning, similar to prior exam. Electronically Signed   By: Miachel Roux M.D.   On: 06/01/2020 10:17   DG CHEST PORT 1 VIEW  Result Date: 06/01/2020 CLINICAL DATA:  Nausea and vomiting. EXAM: PORTABLE CHEST 1 VIEW COMPARISON:  04/20/2020 FINDINGS: The heart size and mediastinal contours are  within normal limits. Both lungs are clear. The visualized skeletal structures are unremarkable. IMPRESSION: No active disease. Electronically Signed   By: Nolon Nations M.D.   On: 06/01/2020 11:25   DG Abd 2 Views  Result Date: 06/01/2020 CLINICAL DATA:  Nausea and vomiting. EXAM: ABDOMEN - 2 VIEW COMPARISON:  01/28/2018 FINDINGS: Bowel gas pattern is nonobstructed. No free intraperitoneal air or evidence for organomegaly. Visualized osseous structures have a normal appearance. IMPRESSION: No evidence for acute  abnormality. Electronically Signed   By: Nolon Nations M.D.   On: 06/01/2020 11:29    Scheduled Meds: . Chlorhexidine Gluconate Cloth  6 each Topical Daily  . Chlorhexidine Gluconate Cloth  6 each Topical Q0600  . cloNIDine  0.2 mg Transdermal Weekly  . heparin injection (subcutaneous)  5,000 Units Subcutaneous Q8H  . hydrALAZINE  100 mg Oral TID  . insulin aspart  0-6 Units Subcutaneous TID WC  . insulin glargine  16 Units Subcutaneous BH-q7a  . latanoprost  1 drop Left Eye QHS  . mouth rinse  15 mL Mouth Rinse BID  . metoCLOPramide (REGLAN) injection  5 mg Intravenous Q6H  . pantoprazole (PROTONIX) IV  40 mg Intravenous Q24H  . sertraline  100 mg Oral q morning  . sodium bicarbonate  650 mg Oral TID  . traZODone  50 mg Oral QHS   Continuous Infusions: . niCARDipine 5 mg/hr (06/02/20 1600)     LOS: 2 days   Time spent: 35  minutes.  Patrecia Pour, MD [facility] [email or web address] 06/02/2020, 4:31 PM

## 2020-06-02 NOTE — Progress Notes (Signed)
Hemodialysis treatment rescheduled for Tuesday, May 17 per Dr. Jonnie Finner.  Primary RN Rebeca Alert was notified of change.

## 2020-06-02 NOTE — Progress Notes (Signed)
IT issue with lab, so here are AM lab results that didn't cross into Epic:  ___________________ Renal Panel 03:36 Na        138 K          3.8 Cl         109 CO2     17 Glc       173 BUN     45 Crt        6.78 Ca++    8.5 Phos     5.6 Alb        2.5 GFR      8 An Gap 12 ___________________   ___________________ Iron Panel 0336  Iron 133 TIBC 246 %SAT 54 UIBC 113 ___________________  Hep B labs still pending.

## 2020-06-03 DIAGNOSIS — N185 Chronic kidney disease, stage 5: Secondary | ICD-10-CM | POA: Diagnosis not present

## 2020-06-03 DIAGNOSIS — E1010 Type 1 diabetes mellitus with ketoacidosis without coma: Secondary | ICD-10-CM | POA: Diagnosis not present

## 2020-06-03 DIAGNOSIS — F32A Depression, unspecified: Secondary | ICD-10-CM | POA: Diagnosis not present

## 2020-06-03 DIAGNOSIS — I10 Essential (primary) hypertension: Secondary | ICD-10-CM | POA: Diagnosis not present

## 2020-06-03 LAB — RENAL FUNCTION PANEL
Albumin: 2 g/dL — ABNORMAL LOW (ref 3.5–5.0)
Anion gap: 7 (ref 5–15)
BUN: 27 mg/dL — ABNORMAL HIGH (ref 6–20)
CO2: 22 mmol/L (ref 22–32)
Calcium: 8.1 mg/dL — ABNORMAL LOW (ref 8.9–10.3)
Chloride: 106 mmol/L (ref 98–111)
Creatinine, Ser: 4.8 mg/dL — ABNORMAL HIGH (ref 0.44–1.00)
GFR, Estimated: 12 mL/min — ABNORMAL LOW (ref >60)
Glucose, Bld: 105 mg/dL — ABNORMAL HIGH (ref 70–99)
Phosphorus: 4 mg/dL (ref 2.5–4.6)
Potassium: 3.4 mmol/L — ABNORMAL LOW (ref 3.5–5.1)
Sodium: 135 mmol/L (ref 135–145)

## 2020-06-03 LAB — GLUCOSE, CAPILLARY
Glucose-Capillary: 79 mg/dL (ref 70–99)
Glucose-Capillary: 87 mg/dL (ref 70–99)
Glucose-Capillary: 81 mg/dL (ref 70–99)
Glucose-Capillary: 107 mg/dL — ABNORMAL HIGH (ref 70–99)
Glucose-Capillary: 164 mg/dL — ABNORMAL HIGH (ref 70–99)

## 2020-06-03 MED ORDER — RENA-VITE PO TABS
1.0000 | ORAL_TABLET | Freq: Every day | ORAL | Status: DC
  Administered 2020-06-03 – 2020-06-08 (×6): 1 via ORAL
  Filled 2020-06-03 (×6): qty 1

## 2020-06-03 MED ORDER — AMLODIPINE BESYLATE 5 MG PO TABS
5.0000 mg | ORAL_TABLET | Freq: Every day | ORAL | Status: DC
  Administered 2020-06-03 – 2020-06-05 (×3): 5 mg via ORAL
  Filled 2020-06-03 (×3): qty 1

## 2020-06-03 MED ORDER — LABETALOL HCL 5 MG/ML IV SOLN
10.0000 mg | INTRAVENOUS | Status: DC | PRN
  Administered 2020-06-04 – 2020-06-06 (×5): 10 mg via INTRAVENOUS
  Filled 2020-06-03 (×5): qty 4

## 2020-06-03 MED ORDER — NEPRO/CARBSTEADY PO LIQD
237.0000 mL | Freq: Two times a day (BID) | ORAL | Status: DC
  Administered 2020-06-03 – 2020-06-08 (×8): 237 mL via ORAL

## 2020-06-03 MED ORDER — PANTOPRAZOLE SODIUM 40 MG PO TBEC
40.0000 mg | DELAYED_RELEASE_TABLET | Freq: Every day | ORAL | Status: DC
  Administered 2020-06-04 – 2020-06-09 (×6): 40 mg via ORAL
  Filled 2020-06-03 (×7): qty 1

## 2020-06-03 MED ORDER — INSULIN GLARGINE 100 UNIT/ML ~~LOC~~ SOLN
8.0000 [IU] | Freq: Every day | SUBCUTANEOUS | Status: DC
  Administered 2020-06-03 – 2020-06-09 (×7): 8 [IU] via SUBCUTANEOUS
  Filled 2020-06-03 (×8): qty 0.08

## 2020-06-03 MED ORDER — HEPARIN SODIUM (PORCINE) 1000 UNIT/ML IJ SOLN
INTRAMUSCULAR | Status: AC
  Filled 2020-06-03: qty 4

## 2020-06-03 NOTE — Progress Notes (Signed)
PROGRESS NOTE  PRESLY GINES  [id number] DOB: 1990-06-21 DOA: 05/31/2020 PCP: Fanny Bien, MD   Brief Narrative: SHAMONTE PANGAN is a 30 y.o. female with a history of T2DM, HTN, and stage V CKD who presented to the ED 5/14 with nausea and vomiting found to be in DKA with hypertensive urgency as well as acute on chronic renal failure. Nicardipine infusion and insulin infusions started, and patient admitted with nephrology recommending transfer to [facility] for initiation of iHD. TDC was placed 5/16, dialysis initiated 5/17. Insulin infusion has been transitioned to basal-bolus insulin with sufficient control. Antihypertensives are being titrated as nicardipine infusion is weaned off.  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  - HD to start 5/17 per nephrology. - Check labs this PM  NAGMA:  - Continue bicarbonate  AOCKD:  - Iron studies, monitor CBC this PM  T1DM with DKA POA: DKA resolved. HbA1c 7.5%. - Continue lantus, uremic poor appetite and nausea limiting po intake considerably, glucose 79 and pt not planning to eat. Will decrease lantus by half (16u > 8u), continue very sensitive SSI. At inpatient goal - Carb-mod diet  HTN urgency:  - Initially weaned from nicardipine, but has required reinitiation now back to '5mg'$ /hr.  - Continue clonidine patch - Continue increased hydralazine '100mg'$  TID. - Restarted coreg 5/16 PM - Will start prn labetalol in hopes of durably weaning off cardene.   Depression:  - Continue SSRI  DVT  prophylaxis: Heparin Code Status: Full Family Communication: None at bedside Disposition Plan:  Status is: Inpatient - Remains inpatient appropriate because:Inpatient level of care appropriate due to severity of illness  Dispo: The patient is from: Home              Anticipated d/c is to: Home              Patient currently is not medically stable to d/c.   Difficult to place patient No  Consultants:   Nephrology  IR  Procedures:   Right IJ TDC placement 06/02/2020  Dialysis 5/17  Antimicrobials:  None   Subjective: No appetite, sleepy, no pain anywhere, no dyspnea. No bleeding. Receiving dialysis in room currently.  Objective: Vitals:   06/03/20 0815 06/03/20 0830 06/03/20 0845 06/03/20 0849  BP: (!) 142/70 (!) 143/69 129/76   Pulse: 85 89 81   Resp: '16 14 16   '$ Temp:    98.6 F (37 C)  TempSrc:      SpO2: 96% 97% 97%   Weight:      Height:        Intake/Output Summary (Last 24 hours) at 06/03/2020 0855 Last data filed at 06/03/2020 0700 Gross per 24 hour  Intake 2561.5 ml  Output 1100 ml  Net 1461.5 ml   Filed Weights   05/31/20 1644 06/02/20 0630 06/03/20 0630  Weight: 66.4 kg 73.9 kg 74.2 kg   Gen: 30 y.o. female in no distress Pulm: Nonlabored breathing room air. Clear. CV: Regular rate and rhythm. No murmur, rub, or gallop. No JVD, no  dependent edema. GI: Abdomen soft, non-tender, non-distended, with normoactive bowel sounds.  Ext: Warm, no deformities Skin: No rashes, lesions or ulcers on visualized skin. Right IJ TDC c/d/i with scant dried blood. Neuro: Tired but rousable and oriented. No focal neurological deficits. Psych: Judgement and insight appear fair. Mood euthymic & affect congruent. Behavior is appropriate.    Data Reviewed: I have personally reviewed following labs and imaging studies  CBC: Recent Labs  Lab 05/31/20 1223 06/01/20 0836 06/02/20 0336  WBC 10.2 12.5* 13.6*  NEUTROABS  --  9.4* 9.8*  HGB 11.8* 10.0* 9.3*  HCT 37.0  31.9* 28.6*  MCV 79.9* 80.6 79.9*  PLT 577* 541* [id number]*   Basic Metabolic Panel: Recent Labs  Lab 06/01/20 0035 06/01/20 0348 06/01/20 0720 06/01/20 1245 06/02/20 0336 06/02/20 0848  NA 144 146* 144  --  138 139  K 3.6 3.8 3.6  --  3.8 3.9  CL 114* 116* 117*  --  109 111  CO2 17* 18* 17*  --  17* 17*  GLUCOSE 180* 167* 179*  --  173* 178*  BUN 59* 57* 55*  --  45* 43*  CREATININE 7.13* 7.23* 7.05*  --  6.78* 7.00*  CALCIUM 9.6 9.5 9.1  --  8.5* 8.4*  PHOS  --   --   --  3.3 5.6*  --    GFR: Estimated Creatinine Clearance: 12.2 mL/min (A) (by C-G formula based on SCr of 7 mg/dL (H)). Liver Function Tests: Recent Labs  Lab 05/31/20 1223 06/02/20 0336  AST 19  --   ALT 14  --   ALKPHOS 67  --   BILITOT 0.7  --   PROT 8.0  --   ALBUMIN 3.3* 2.5*   Recent Labs  Lab 05/31/20 1223  LIPASE 35   No results for input(s): AMMONIA in the last 168 hours. Coagulation Profile: No results for input(s): INR, PROTIME in the last 168 hours. Cardiac Enzymes: No results for input(s): CKTOTAL, CKMB, CKMBINDEX, TROPONINI in the last 168 hours. BNP (last 3 results) No results for input(s): PROBNP in the last 8760 hours. HbA1C: Recent Labs    05/31/20 1644  HGBA1C 7.5*   CBG: Recent Labs  Lab 06/02/20 1300 06/02/20 1602 06/02/20 2010 06/03/20 0548 06/03/20 0822  GLUCAP 141* 158* 124* 79 87   Lipid Profile: No results for input(s): CHOL, HDL, LDLCALC, TRIG, CHOLHDL, LDLDIRECT in the last 72 hours. Thyroid Function Tests: No results for input(s): TSH, T4TOTAL, FREET4, T3FREE, THYROIDAB in the last 72 hours. Anemia Panel: Recent Labs    06/02/20 0336  TIBC 246*  IRON 133   Urine analysis:    Component Value Date/Time   COLORURINE YELLOW 06/01/2020 0530   APPEARANCEUR HAZY (A) 06/01/2020 0530   LABSPEC 1.012 06/01/2020 0530   PHURINE 6.0 06/01/2020 0530   GLUCOSEU >=500 (A) 06/01/2020 0530   HGBUR NEGATIVE 06/01/2020 0530   BILIRUBINUR NEGATIVE 06/01/2020 0530    KETONESUR 20 (A) 06/01/2020 0530   PROTEINUR >=300 (A) 06/01/2020 0530   UROBILINOGEN 0.2 08/14/2019 1051   NITRITE NEGATIVE 06/01/2020 0530   LEUKOCYTESUR NEGATIVE 06/01/2020 0530   Recent Results (from the past 240 hour(s))  MRSA PCR Screening     Status: None   Collection Time: 05/31/20  4:13 PM   Specimen: Nasal Mucosa; Nasopharyngeal  Result Value Ref Range Status   MRSA by PCR NEGATIVE NEGATIVE Final    Comment:        The GeneXpert MRSA Assay (FDA approved for  NASAL specimens only), is one component of a comprehensive MRSA colonization surveillance program. It is not intended to diagnose MRSA infection nor to guide or monitor treatment for MRSA infections. Performed at [facility], [place] [address], [place], [place] [address]   Resp Panel by RT-PCR (Flu A&B, Covid) Nasopharyngeal Swab     Status: None   Collection Time: 05/31/20  7:00 PM   Specimen: Nasopharyngeal Swab; Nasopharyngeal(NP) swabs in vial transport medium  Result Value Ref Range Status   SARS Coronavirus 2 by RT PCR NEGATIVE NEGATIVE Final    Comment: (NOTE) SARS-CoV-2 target nucleic acids are NOT DETECTED.  The SARS-CoV-2 RNA is generally detectable in upper respiratory specimens during the acute phase of infection. The lowest concentration of SARS-CoV-2 viral copies this assay can detect is 138 copies/mL. A negative result does not preclude SARS-Cov-2 infection and should not be used as the sole basis for treatment or other patient management decisions. A negative result may occur with  improper specimen collection/handling, submission of specimen other than nasopharyngeal swab, presence of viral mutation(s) within the areas targeted by this assay, and inadequate number of viral copies(<138 copies/mL). A negative result must be combined with clinical observations, patient history, and epidemiological information. The expected result is Negative.  Fact Sheet for Patients:   EntrepreneurPulse.com.au  Fact Sheet for Healthcare Providers:  IncredibleEmployment.be  This test is no t yet approved or cleared by the [place] FDA and  has been authorized for detection and/or diagnosis of SARS-CoV-2 by FDA under an Emergency Use Authorization (EUA). This EUA will remain  in effect (meaning this test can be used) for the duration of the COVID-19 declaration under Section 564(b)(1) of the Act, 21 U.S.C.section 360bbb-3(b)(1), unless the authorization is terminated  or revoked sooner.       Influenza A by PCR NEGATIVE NEGATIVE Final   Influenza B by PCR NEGATIVE NEGATIVE Final    Comment: (NOTE) The Xpert Xpress SARS-CoV-2/FLU/RSV plus assay is intended as an aid in the diagnosis of influenza from Nasopharyngeal swab specimens and should not be used as a sole basis for treatment. Nasal washings and aspirates are unacceptable for Xpert Xpress SARS-CoV-2/FLU/RSV testing.  Fact Sheet for Patients: EntrepreneurPulse.com.au  Fact Sheet for Healthcare Providers: IncredibleEmployment.be  This test is not yet approved or cleared by the [place] FDA and has been authorized for detection and/or diagnosis of SARS-CoV-2 by FDA under an Emergency Use Authorization (EUA). This EUA will remain in effect (meaning this test can be used) for the duration of the COVID-19 declaration under Section 564(b)(1) of the Act, 21 U.S.C. section 360bbb-3(b)(1), unless the authorization is terminated or revoked.  Performed at [facility], [place] [address] [place],  [address]       Radiology Studies: IR Fluoro Guide CV Line Right  Result Date: 06/02/2020 INDICATION: 30 year old with renal failure and needs hemodialysis catheter. EXAM: FLUOROSCOPIC AND ULTRASOUND GUIDED PLACEMENT OF A TUNNELED DIALYSIS CATHETER Physician: Stephan Minister. Anselm Pancoast, MD MEDICATIONS: Ancef 2 g; The antibiotic  was administered within an appropriate time interval prior to skin puncture. ANESTHESIA/SEDATION: Versed 1.0 mg IV; Fentanyl 50 mcg IV; Moderate Sedation Time:  28 minutes The patient was continuously monitored during the procedure by the interventional radiology nurse under my direct supervision. FLUOROSCOPY TIME:  42 seconds, 2 mGy COMPLICATIONS: None immediate. PROCEDURE: The procedure was explained to the patient. The risks and benefits of the procedure were discussed and the patient's questions were addressed. Informed consent was obtained from  the patient. The patient was placed supine on the interventional table. Ultrasound confirmed a patent right internal jugular vein. Ultrasound images were obtained for documentation. The right neck and chest was prepped and draped in a sterile fashion. Maximal barrier sterile technique was utilized including caps, mask, sterile gowns, sterile gloves, sterile drape, hand hygiene and skin antiseptic. The right neck was anesthetized with 1% lidocaine. A small incision was made with #11 blade scalpel. A 21 gauge needle directed into the right internal jugular vein with ultrasound guidance. A micropuncture dilator set was placed. A 19 cm tip to cuff Palindrome catheter was selected. The skin below the right clavicle was anesthetized and a small incision was made with an #11 blade scalpel. A subcutaneous tunnel was formed to the vein dermatotomy site. The catheter was brought through the tunnel. The vein dermatotomy site was dilated to accommodate a peel-away sheath. The catheter was placed through the peel-away sheath and directed into the central venous structures. The tip of the catheter was placed at the superior cavoatrial junction with fluoroscopy. Fluoroscopic images were obtained for documentation. Both lumens were found to aspirate and flush well. The proper amount of heparin was flushed in both lumens. The vein dermatotomy site was closed using a single layer of  absorbable suture and Dermabond. Surgifoam was placed in the subcutaneous tract. The catheter was secured to the skin using Prolene suture. IMPRESSION: Successful placement of a right jugular tunneled dialysis catheter using ultrasound and fluoroscopic guidance. Electronically Signed   By: Markus Daft M.D.   On: 06/02/2020 17:31   IR US Guide Vasc Access Right  Result Date: 06/02/2020 INDICATION: 30 year old with renal failure and needs hemodialysis catheter. EXAM: FLUOROSCOPIC AND ULTRASOUND GUIDED PLACEMENT OF A TUNNELED DIALYSIS CATHETER Physician: Stephan Minister. Anselm Pancoast, MD MEDICATIONS: Ancef 2 g; The antibiotic was administered within an appropriate time interval prior to skin puncture. ANESTHESIA/SEDATION: Versed 1.0 mg IV; Fentanyl 50 mcg IV; Moderate Sedation Time:  28 minutes The patient was continuously monitored during the procedure by the interventional radiology nurse under my direct supervision. FLUOROSCOPY TIME:  42 seconds, 2 mGy COMPLICATIONS: None immediate. PROCEDURE: The procedure was explained to the patient. The risks and benefits of the procedure were discussed and the patient's questions were addressed. Informed consent was obtained from the patient. The patient was placed supine on the interventional table. Ultrasound confirmed a patent right internal jugular vein. Ultrasound images were obtained for documentation. The right neck and chest was prepped and draped in a sterile fashion. Maximal barrier sterile technique was utilized including caps, mask, sterile gowns, sterile gloves, sterile drape, hand hygiene and skin antiseptic. The right neck was anesthetized with 1% lidocaine. A small incision was made with #11 blade scalpel. A 21 gauge needle directed into the right internal jugular vein with ultrasound guidance. A micropuncture dilator set was placed. A 19 cm tip to cuff Palindrome catheter was selected. The skin below the right clavicle was anesthetized and a small incision was made with an  #11 blade scalpel. A subcutaneous tunnel was formed to the vein dermatotomy site. The catheter was brought through the tunnel. The vein dermatotomy site was dilated to accommodate a peel-away sheath. The catheter was placed through the peel-away sheath and directed into the central venous structures. The tip of the catheter was placed at the superior cavoatrial junction with fluoroscopy. Fluoroscopic images were obtained for documentation. Both lumens were found to aspirate and flush well. The proper amount of heparin was flushed in both lumens. The  vein dermatotomy site was closed using a single layer of absorbable suture and Dermabond. Surgifoam was placed in the subcutaneous tract. The catheter was secured to the skin using Prolene suture. IMPRESSION: Successful placement of a right jugular tunneled dialysis catheter using ultrasound and fluoroscopic guidance. Electronically Signed   By: Markus Daft M.D.   On: 06/02/2020 17:31   DG CHEST PORT 1 VIEW  Result Date: 06/01/2020 CLINICAL DATA:  Nausea and vomiting. EXAM: PORTABLE CHEST 1 VIEW COMPARISON:  04/20/2020 FINDINGS: The heart size and mediastinal contours are within normal limits. Both lungs are clear. The visualized skeletal structures are unremarkable. IMPRESSION: No active disease. Electronically Signed   By: Nolon Nations M.D.   On: 06/01/2020 11:25   DG Abd 2 Views  Result Date: 06/01/2020 CLINICAL DATA:  Nausea and vomiting. EXAM: ABDOMEN - 2 VIEW COMPARISON:  01/28/2018 FINDINGS: Bowel gas pattern is nonobstructed. No free intraperitoneal air or evidence for organomegaly. Visualized osseous structures have a normal appearance. IMPRESSION: No evidence for acute  abnormality. Electronically Signed   By: Nolon Nations M.D.   On: 06/01/2020 11:29    Scheduled Meds: . carvedilol  25 mg Oral BID  . Chlorhexidine Gluconate Cloth  6 each Topical Daily  . Chlorhexidine Gluconate Cloth  6 each Topical Q0600  . cloNIDine  0.2 mg Transdermal  Weekly  . heparin injection (subcutaneous)  5,000 Units Subcutaneous Q8H  . heparin sodium (porcine)      . hydrALAZINE  100 mg Oral TID  . insulin aspart  0-6 Units Subcutaneous TID WC  . [START ON 06/04/2020] insulin glargine  8 Units Subcutaneous [id number]  . latanoprost  1 drop Left Eye QHS  . mouth rinse  15 mL Mouth Rinse BID  . metoCLOPramide (REGLAN) injection  5 mg Intravenous Q6H  . pantoprazole (PROTONIX) IV  40 mg Intravenous Q24H  . sertraline  100 mg Oral q morning  . sodium bicarbonate  650 mg Oral TID  . traZODone  50 mg Oral QHS   Continuous Infusions: . niCARDipine 3 mg/hr (06/03/20 0700)     LOS: 3 days   Time spent: 35 minutes.  Patrecia Pour, MD [facility] [email or web address] 06/03/2020, 8:55 AM

## 2020-06-03 NOTE — Progress Notes (Signed)
Initial Nutrition Assessment  DOCUMENTATION CODES:   Not applicable  INTERVENTION:    Nepro Shake po BID, each supplement provides 425 kcal and 19 grams protein  Renal MVI daily   NUTRITION DIAGNOSIS:   Increased nutrient needs related to chronic illness (CKD V with need for HD) as evidenced by estimated needs.   GOAL:   Patient will meet greater than or equal to 90% of their needs  MONITOR:   Supplement acceptance,PO intake,Weight trends,Diet advancement,Labs,I & O's  REASON FOR ASSESSMENT:   Rounds    ASSESSMENT:   Patient with PMH significant for type 1 DM, HTN, and stage V CKD. Presents this admission with ARF on CKD V.   5/16- s/p tunneled HD catheter 5/17- first HD  Patient endorses a loss in appetite over the last three days. During this time she was able to tolerate bites and sips at meal time. Prior to this she consumed three meals daily that consisted of B- oatmeal, yogurt L- chicken salad D- meat, vegetable, grain (that her boyfriend prepared). Has not any breakfast this am due to HD and lethargy. RD discussed the importance of increased protein intake with HD. Patient is willing to try supplementation.   Patient ultimately wants to do PD. Was tired after first HD treatment. Will need further diet education once out of ICU.    Patient reports taking lantus once in the morning and humalog TID. States she has been complaint with this regimen.   Patient reports a UBW of 163 lb and is unsure of recent weight loss. Records indicate patient weighed 180 lb on 02/09/20 and 163 lb this admission (9.4% wt loss in five months, significant for time frame). Suspect patient is at risk for malnutrition or malnourished but will need to repeat NFPE once volume is able to be pulled on HD.   UOP: 1100 ml x 24 hrs   Medications: SS novolog, lantus, 5 mg reglan QID Labs: Phosphorus 5.6 (wdl for HD) CBG 79-226  NUTRITION - FOCUSED PHYSICAL EXAM:  Flowsheet Row Most Recent  Value  Orbital Region No depletion  Upper Arm Region No depletion  Thoracic and Lumbar Region Unable to assess  Buccal Region No depletion  Temple Region No depletion  Clavicle Bone Region No depletion  Clavicle and Acromion Bone Region No depletion  Scapular Bone Region Unable to assess  Dorsal Hand No depletion  Patellar Region No depletion  Anterior Thigh Region No depletion  Posterior Calf Region No depletion  Edema (RD Assessment) Mild  Hair Reviewed  Eyes Reviewed  Mouth Reviewed  Skin Reviewed  Nails Reviewed     Diet Order:   Diet Order            Diet renal/carb modified with fluid restriction Diet-HS Snack? Nothing; Fluid restriction: 1200 mL Fluid; Room service appropriate? Yes; Fluid consistency: Thin  Diet effective now                 EDUCATION NEEDS:   Not appropriate for education at this time  Skin:  Skin Assessment: Reviewed RN Assessment  Last BM:  PTA  Height:   Ht Readings from Last 1 Encounters:  06/02/20 '5\' 6"'$  (1.676 m)    Weight:   Wt Readings from Last 1 Encounters:  06/03/20 74.2 kg    BMI:  Body mass index is 26.4 kg/m.  Estimated Nutritional Needs:   Kcal:  2000-2200 kcal  Protein:  100-120 grams  Fluid:  1000 ml + UOP  Velisa Regnier RD,  LDN Clinical Nutrition Pager listed in [place]

## 2020-06-03 NOTE — Progress Notes (Signed)
Hemodialysis- First treatment tolerated well without issue. 0 UF removed as ordered. Patient has no complaints/questions. Reported off to primary RN.

## 2020-06-03 NOTE — Progress Notes (Signed)
[facility] Progress Note  Subjective: had HD this am w/ o incident. Weaning cardene down. Off of IV insulin.   Vitals:   06/03/20 0930 06/03/20 0945 06/03/20 1000 06/03/20 1015  BP: (!) 152/88 (!) 170/103 (!) 193/104 (!) 180/101  Pulse: 73 94 (!) 104 (!) 101  Resp: 10 13 13 10   Temp:      TempSrc:      SpO2: 97% 99% 100% 100%  Weight:      Height:        Exam:  alert, nad   no jvd  Chest cta bilat  Cor reg no RG  Abd soft ntnd no ascites   Ext no LE edema   Alert, NF, ox3, no asterixis                 CXR 5/15  - no acute disease  Assessment/ Plan: 1. CKD stage V - with worsening renal function and uremic symptoms. eGFR down to 8 ml/min. Pt wants to do PD ultimately. 1st HD today, plan for 2nd HD tomorrow.  2. HTN/ volume - BP's high , weaning cardene. On catapres patch 2 and po hydralazine, coreg added today. Lower vol some w/ next HD tomorrow.  3. DM1/ gastroparesis - per primary team, BS's < 200 now.  4. Anemia ckd - Hb is 10.0, follow. Check tsat.  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 will check phos.       Rob Ved Martos 06/03/2020, 10:36 AM   Recent Labs  Lab 06/01/20 0836 06/01/20 1245 06/02/20 0336 06/02/20 0848  K  --   --  3.8 3.9  BUN  --   --  45* 43*  CREATININE  --   --  6.78* 7.00*  CALCIUM  --   --  8.5* 8.4*  PHOS  --  3.3 5.6*  --   HGB 10.0*  --  9.3*  --    Inpatient medications: . carvedilol  25 mg Oral BID  . Chlorhexidine Gluconate Cloth  6 each Topical Daily  . Chlorhexidine Gluconate Cloth  6 each Topical Q0600  . cloNIDine  0.2 mg Transdermal Weekly  . heparin injection (subcutaneous)  5,000 Units Subcutaneous Q8H  . heparin sodium (porcine)      . hydrALAZINE  100 mg Oral TID  . insulin aspart  0-6 Units Subcutaneous TID WC  . insulin glargine  8 Units Subcutaneous Daily  . latanoprost  1 drop Left Eye QHS  . mouth rinse  15 mL Mouth Rinse BID  . metoCLOPramide (REGLAN) injection  5 mg Intravenous Q6H  . pantoprazole  (PROTONIX) IV  40 mg Intravenous Q24H  . sertraline  100 mg Oral q morning  . sodium bicarbonate  650 mg Oral TID  . traZODone  50 mg Oral QHS   . niCARDipine Stopped (06/03/20 0849)   dextrose, HYDROmorphone, labetalol, prochlorperazine

## 2020-06-04 DIAGNOSIS — E1010 Type 1 diabetes mellitus with ketoacidosis without coma: Secondary | ICD-10-CM | POA: Diagnosis not present

## 2020-06-04 DIAGNOSIS — I160 Hypertensive urgency: Secondary | ICD-10-CM | POA: Diagnosis not present

## 2020-06-04 DIAGNOSIS — D631 Anemia in chronic kidney disease: Secondary | ICD-10-CM | POA: Diagnosis not present

## 2020-06-04 DIAGNOSIS — N185 Chronic kidney disease, stage 5: Secondary | ICD-10-CM | POA: Diagnosis not present

## 2020-06-04 LAB — CBC
HCT: 27.3 % — ABNORMAL LOW (ref 36.0–46.0)
Hemoglobin: 8.7 g/dL — ABNORMAL LOW (ref 12.0–15.0)
MCH: 25.7 pg — ABNORMAL LOW (ref 26.0–34.0)
MCHC: 31.9 g/dL (ref 30.0–36.0)
MCV: 80.5 fL (ref 80.0–100.0)
Platelets: 292 10*3/uL (ref 150–400)
RBC: 3.39 MIL/uL — ABNORMAL LOW (ref 3.87–5.11)
RDW: 14.8 % (ref 11.5–15.5)
WBC: 8.3 10*3/uL (ref 4.0–10.5)
nRBC: 0 % (ref 0.0–0.2)

## 2020-06-04 LAB — RENAL FUNCTION PANEL
Albumin: 1.9 g/dL — ABNORMAL LOW (ref 3.5–5.0)
Anion gap: 7 (ref 5–15)
BUN: 31 mg/dL — ABNORMAL HIGH (ref 6–20)
CO2: 22 mmol/L (ref 22–32)
Calcium: 7.7 mg/dL — ABNORMAL LOW (ref 8.9–10.3)
Chloride: 104 mmol/L (ref 98–111)
Creatinine, Ser: 5.09 mg/dL — ABNORMAL HIGH (ref 0.44–1.00)
GFR, Estimated: 11 mL/min — ABNORMAL LOW (ref >60)
Glucose, Bld: 131 mg/dL — ABNORMAL HIGH (ref 70–99)
Phosphorus: 4.1 mg/dL (ref 2.5–4.6)
Potassium: 3.6 mmol/L (ref 3.5–5.1)
Sodium: 133 mmol/L — ABNORMAL LOW (ref 135–145)

## 2020-06-04 LAB — GLUCOSE, CAPILLARY
Glucose-Capillary: 134 mg/dL — ABNORMAL HIGH (ref 70–99)
Glucose-Capillary: 179 mg/dL — ABNORMAL HIGH (ref 70–99)
Glucose-Capillary: 59 mg/dL — ABNORMAL LOW (ref 70–99)
Glucose-Capillary: 112 mg/dL — ABNORMAL HIGH (ref 70–99)
Glucose-Capillary: 162 mg/dL — ABNORMAL HIGH (ref 70–99)
Glucose-Capillary: 141 mg/dL — ABNORMAL HIGH (ref 70–99)

## 2020-06-04 MED ORDER — FUROSEMIDE 80 MG PO TABS
80.0000 mg | ORAL_TABLET | Freq: Every day | ORAL | Status: DC
  Administered 2020-06-04 – 2020-06-05 (×2): 80 mg via ORAL
  Filled 2020-06-04 (×2): qty 1

## 2020-06-04 MED ORDER — METOCLOPRAMIDE HCL 5 MG/ML IJ SOLN
5.0000 mg | Freq: Four times a day (QID) | INTRAMUSCULAR | Status: DC | PRN
  Administered 2020-06-05: 5 mg via INTRAVENOUS
  Filled 2020-06-04: qty 2

## 2020-06-04 MED ORDER — HEPARIN SODIUM (PORCINE) 1000 UNIT/ML IJ SOLN
INTRAMUSCULAR | Status: AC
  Filled 2020-06-04: qty 1

## 2020-06-04 MED ORDER — DARBEPOETIN ALFA 60 MCG/0.3ML IJ SOSY
60.0000 ug | PREFILLED_SYRINGE | INTRAMUSCULAR | Status: DC
  Filled 2020-06-04: qty 0.3

## 2020-06-04 MED ORDER — DARBEPOETIN ALFA 60 MCG/0.3ML IJ SOSY
PREFILLED_SYRINGE | INTRAMUSCULAR | Status: AC
  Administered 2020-06-04: 60 ug via INTRAVENOUS
  Filled 2020-06-04: qty 0.3

## 2020-06-04 NOTE — Progress Notes (Signed)
Nephrology Follow-Up Consult note   Assessment/Recommendations: Barbara Donovan is a/an 30 y.o. female with a past medical history significant for type 1 diabetes and significant CKD, admitted for worsening renal failure now ESRD.     ESRD: Progressive CKD related to type 1 diabetes now ESRD.  She is my clinic patient and we have planned on PD but because of insurance issues she had difficulty getting into the office and ultimately is now admitted. -Continue dialysis today 2/3 initiation sessions; hd again tomorrow -CLIP started; hoping to get her to TCU and switch to PD quickly -Will hold off on VVS consult for now as may not get AVF given plans for PD  Hypertension: chronic issue c/b ESRD. Remove fluid today. Start lasix daily but otherwise maintain current meds with hope to wean clonidine and hydral as we improve volume on HD.   Anemia due to CKD: hgb 8.7, iron sat 54, start Darbo 66mg.  Uncontrolled Diabetes Mellitus Type 1 with Hyperglycemia: mgmt per primary  Secondary hyperparathyroidism: PTH elevated in the past.  Phosphorus 4.1.  Calcium 7.7 but corrects to normal.  Check PTH   Recommendations conveyed to primary service.    [facility] 06/04/2020 9:31 AM  ___________________________________________________________  CC: ESRD  Interval History/Subjective: Patient states she feels well today.  Tolerated dialysis yesterday without significant issues.  Blood pressure remains high but off cardene.    Medications:  Current Facility-Administered Medications  Medication Dose Route Frequency Provider Last Rate Last Admin  . amLODipine (NORVASC) tablet 5 mg  5 mg Oral Daily GPatrecia Pour MD   5 mg at 06/04/20 0920  . carvedilol (COREG) tablet 25 mg  25 mg Oral BID GPatrecia Pour MD   25 mg at 06/04/20 0920  . Chlorhexidine Gluconate Cloth 2 % PADS 6 each  6 each Topical Daily Kyle, Tyrone A, DO   6 each at 06/03/20 1700  . Chlorhexidine  Gluconate Cloth 2 % PADS 6 each  6 each Topical Q0600 SRoney Jaffe MD   6 each at 06/02/20 1900  . cloNIDine (CATAPRES - Dosed in mg/24 hr) patch 0.2 mg  0.2 mg Transdermal Weekly Kyle, Tyrone A, DO   0.2 mg at 05/31/20 1720  . dextrose 50 % solution 0-50 mL  0-50 mL Intravenous PRN KMarylyn Ishihara Tyrone A, DO      . feeding supplement (NEPRO CARB STEADY) liquid 237 mL  237 mL Oral BID BM GVance GatherB, MD   237 mL at 06/03/20 1520  . heparin injection 5,000 Units  5,000 Units Subcutaneous Q8H TEugenie Filler MD   5,000 Units at 06/04/20 [id number] . hydrALAZINE (APRESOLINE) tablet 100 mg  100 mg Oral TID GPatrecia Pour MD   100 mg at 06/03/20 2105  . HYDROmorphone (DILAUDID) tablet 2 mg  2 mg Oral Q3H PRN GPatrecia Pour MD   2 mg at 06/03/20 0144  . insulin aspart (novoLOG) injection 0-6 Units  0-6 Units Subcutaneous TID WC TEugenie Filler MD   1 Units at 06/02/20 1625  . insulin glargine (LANTUS) injection 8 Units  8 Units Subcutaneous Daily GPatrecia Pour MD   8 Units at 06/04/20 0919  . labetalol (NORMODYNE) injection 10 mg  10 mg Intravenous Q2H PRN GPatrecia Pour MD      . latanoprost (XALATAN) 0.005 % ophthalmic solution 1 drop  1 drop Left Eye QHS TEugenie Filler MD   1 drop at 06/03/20 2129  .  MEDLINE mouth rinse  15 mL Mouth Rinse BID Marylyn Ishihara, Tyrone A, DO   15 mL at 06/03/20 2200  . metoCLOPramide (REGLAN) injection 5 mg  5 mg Intravenous Q6H Eugenie Filler, MD   5 mg at 06/04/20 [id number]  . multivitamin (RENA-VIT) tablet 1 tablet  1 tablet Oral QHS Patrecia Pour, MD   1 tablet at 06/03/20 2105  . pantoprazole (PROTONIX) EC tablet 40 mg  40 mg Oral Daily Kris Mouton, RPH   40 mg at 06/04/20 0919  . prochlorperazine (COMPAZINE) injection 10 mg  10 mg Intravenous Q6H PRN Eugenie Filler, MD   10 mg at 06/02/20 1017  . sertraline (ZOLOFT) tablet 100 mg  100 mg Oral q morning Eugenie Filler, MD   100 mg at 06/04/20 0919  . traZODone (DESYREL) tablet 50 mg  50 mg Oral QHS Eugenie Filler, MD   50 mg at 06/03/20 2105      Review of Systems: 10 systems reviewed and negative except per interval history/subjective  Physical Exam: Vitals:   06/04/20 0700 06/04/20 0743  BP:    Pulse: 92   Resp: 16   Temp:  98.7 F (37.1 C)  SpO2: 100%    No intake/output data recorded.  Intake/Output Summary (Last 24 hours) at 06/04/2020 0931 Last data filed at 06/04/2020 0600 Gross per 24 hour  Intake 770.86 ml  Output 1200 ml  Net -429.14 ml   Constitutional: well-appearing, no acute distress ENMT: ears and nose without scars or lesions, MMM CV: normal rate, no edema  Respiratory: Bilateral chest rise, normal work of breathing Gastrointestinal: soft, non-tender, no palpable masses or hernias Skin: no visible lesions or rashes Psych: alert, judgement/insight appropriate, appropriate mood and affect   Test Results I personally reviewed new and old clinical labs and radiology tests Lab Results  Component Value Date   NA 133 (L) 06/04/2020   K 3.6 06/04/2020   CL 104 06/04/2020   CO2 22 06/04/2020   BUN 31 (H) 06/04/2020   CREATININE 5.09 (H) 06/04/2020   GFR 15.72 (L) 12/21/2019   CALCIUM 7.7 (L) 06/04/2020   ALBUMIN 1.9 (L) 06/04/2020   PHOS 4.1 06/04/2020

## 2020-06-04 NOTE — Progress Notes (Signed)
Hypoglycemic Event  CBG:59  Treatment:4 oz juice  Symptoms: low BG  Follow-up CBG: Time:1850 CBG Result:112  Possible Reasons for Event:completion of HD  Comments/MD notified: resolved    Ardeen Garland

## 2020-06-04 NOTE — Plan of Care (Signed)
?  Problem: Fluid Volume: ?Goal: Compliance with measures to maintain balanced fluid volume will improve ?Outcome: Progressing ?  ?Problem: Clinical Measurements: ?Goal: Complications related to the disease process, condition or treatment will be avoided or minimized ?Outcome: Progressing ?  ?

## 2020-06-04 NOTE — Progress Notes (Signed)
Unable to chart against flow sheet for Hemodialysis catheter. Alerted floor RN as well as IT. Capped with heparin 1.6 x 2 dressing change was done on 05/31/20.

## 2020-06-04 NOTE — Progress Notes (Signed)
Patient taken to HD, 1515.

## 2020-06-04 NOTE — Progress Notes (Signed)
PROGRESS NOTE  ALHIA KOVANDA  [id number] DOB: 1990/11/03 DOA: 05/31/2020 PCP: Fanny Bien, MD    Brief Narrative: BEVERLEY BYNUM is a 30 y.o. female with a history of T2DM, HTN, and stage V CKD who presented to the ED 5/14 with nausea and vomiting found to be in DKA with hypertensive urgency as well as acute on chronic renal failure. Nicardipine infusion and insulin infusions started, and patient admitted with nephrology recommending transfer to [facility] for initiation of iHD. TDC was placed 5/16, dialysis initiated 5/17. Insulin infusion has been transitioned to basal-bolus insulin with sufficient control. Antihypertensives are being titrated as nicardipine infusion is weaned off.  Assessment & Plan: Principal Problem:   DKA (diabetic ketoacidosis) ([place]) Active Problems:   Diabetic retinopathy ([place])   Acute depression   Chronic hypertension   Nephropathy, diabetic ([place])   Intractable nausea and vomiting   Hypertensive urgency   Dehydration   Anemia due to stage 5 chronic kidney disease (HCC)   Renal failure (ARF), acute on chronic (HCC)   ARF (acute renal failure) ([place])    ARF on stage V CKD with uremic symptoms (nausea/vomiting): Plan ultimately is PD.  - HD started 5/17 per nephrology.  NAGMA:  - resolved  AOCKD:  - Iron studies normal -transfuse for Hbg <7  T1DM with DKA POA: DKA resolved. HbA1c 7.5%. - Continue lantus -SSI - Carb-mod diet  HTN urgency:  - weaned from nicardipine - Continue clonidine patch - Continue increased hydralazine '100mg'$  TID. - coreg  - PRN  Depression:  - Continue SSRI  DVT prophylaxis: Heparin Code Status: Full Family Communication: None at bedside Disposition Plan:  Status is: Inpatient - Remains inpatient appropriate because:Inpatient level of care appropriate due to severity of illness  Dispo: The patient is from: Home              Anticipated d/c is to: Home              Patient currently is not medically stable to  d/c.   Difficult to place patient No  Consultants:   Nephrology  IR  Procedures:   Right IJ TDC placement 06/02/2020  Dialysis      Subjective: Asking about returning to work  Objective: Vitals:   06/04/20 1000 06/04/20 1100 06/04/20 1122 06/04/20 1200  BP: (!) 178/102 (!) 160/84    Pulse: 79 85  79  Resp: 18 10    Temp:   99 F (37.2 C)   TempSrc:   Oral   SpO2: 98% 99%  100%  Weight:      Height:        Intake/Output Summary (Last 24 hours) at 06/04/2020 1241 Last data filed at 06/04/2020 1224 Gross per 24 hour  Intake 1554 ml  Output 1200 ml  Net 354 ml   Filed Weights   06/02/20 0630 06/03/20 0630 06/03/20 0917  Weight: 73.9 kg 74.2 kg 74.2 kg     General: Appearance:     Overweight female in no acute distress     Lungs:     Clear to auscultation bilaterally, respirations unlabored  Heart:    Normal heart rate. Normal rhythm. No murmurs, rubs, or gallops.   MS:   All extremities are intact.   Neurologic:   Awake, alert, oriented x 3. No apparent focal neurological           defect.     Data Reviewed: I have personally reviewed following labs and imaging studies  CBC:  Recent Labs  Lab 05/31/20 1223 06/01/20 0836 06/02/20 0336 06/04/20 0610  WBC 10.2 12.5* 13.6* 8.3  NEUTROABS  --  9.4* 9.8*  --   HGB 11.8* 10.0* 9.3* 8.7*  HCT 37.0 31.9* 28.6* 27.3*  MCV 79.9* 80.6 79.9* 80.5  PLT 577* 541* 492* [id number]   Basic Metabolic Panel: Recent Labs  Lab 06/01/20 0720 06/01/20 1245 06/02/20 0336 06/02/20 0848 06/03/20 1642 06/04/20 0610  NA 144  --  138 139 135 133*  K 3.6  --  3.8 3.9 3.4* 3.6  CL 117*  --  109 111 106 104  CO2 17*  --  17* 17* 22 22  GLUCOSE 179*  --  173* 178* 105* 131*  BUN 55*  --  45* 43* 27* 31*  CREATININE 7.05*  --  6.78* 7.00* 4.80* 5.09*  CALCIUM 9.1  --  8.5* 8.4* 8.1* 7.7*  PHOS  --  3.3 5.6*  --  4.0 4.1   GFR: Estimated Creatinine Clearance: 16.8 mL/min (A) (by C-G formula based on SCr of 5.09 mg/dL  (H)). Liver Function Tests: Recent Labs  Lab 05/31/20 1223 06/02/20 0336 06/03/20 1642 06/04/20 0610  AST 19  --   --   --   ALT 14  --   --   --   ALKPHOS 67  --   --   --   BILITOT 0.7  --   --   --   PROT 8.0  --   --   --   ALBUMIN 3.3* 2.5* 2.0* 1.9*   Recent Labs  Lab 05/31/20 1223  LIPASE 35   No results for input(s): AMMONIA in the last 168 hours. Coagulation Profile: No results for input(s): INR, PROTIME in the last 168 hours. Cardiac Enzymes: No results for input(s): CKTOTAL, CKMB, CKMBINDEX, TROPONINI in the last 168 hours. BNP (last 3 results) No results for input(s): PROBNP in the last 8760 hours. HbA1C: No results for input(s): HGBA1C in the last 72 hours. CBG: Recent Labs  Lab 06/03/20 1129 06/03/20 1615 06/03/20 1947 06/04/20 0619 06/04/20 1123  GLUCAP 81 107* 164* 134* 179*   Lipid Profile: No results for input(s): CHOL, HDL, LDLCALC, TRIG, CHOLHDL, LDLDIRECT in the last 72 hours. Thyroid Function Tests: No results for input(s): TSH, T4TOTAL, FREET4, T3FREE, THYROIDAB in the last 72 hours. Anemia Panel: Recent Labs    06/02/20 0336  TIBC 246*  IRON 133   Urine analysis:    Component Value Date/Time   COLORURINE YELLOW 06/01/2020 0530   APPEARANCEUR HAZY (A) 06/01/2020 0530   LABSPEC 1.012 06/01/2020 0530   PHURINE 6.0 06/01/2020 0530   GLUCOSEU >=500 (A) 06/01/2020 0530   HGBUR NEGATIVE 06/01/2020 0530   BILIRUBINUR NEGATIVE 06/01/2020 0530   KETONESUR 20 (A) 06/01/2020 0530   PROTEINUR >=300 (A) 06/01/2020 0530   UROBILINOGEN 0.2 08/14/2019 1051   NITRITE NEGATIVE 06/01/2020 0530   LEUKOCYTESUR NEGATIVE 06/01/2020 0530   Recent Results (from the past 240 hour(s))  MRSA PCR Screening     Status: None   Collection Time: 05/31/20  4:13 PM   Specimen: Nasal Mucosa; Nasopharyngeal  Result Value Ref Range Status   MRSA by PCR NEGATIVE NEGATIVE Final    Comment:        The GeneXpert MRSA Assay (FDA approved for NASAL  specimens only), is one component of a comprehensive MRSA colonization surveillance program. It is not intended to diagnose MRSA infection nor to guide or monitor treatment for MRSA infections. Performed at [facility]  [place] [address] [place], [place] [address]   Resp Panel by RT-PCR (Flu A&B, Covid) Nasopharyngeal Swab     Status: None   Collection Time: 05/31/20  7:00 PM   Specimen: Nasopharyngeal Swab; Nasopharyngeal(NP) swabs in vial transport medium  Result Value Ref Range Status   SARS Coronavirus 2 by RT PCR NEGATIVE NEGATIVE Final    Comment: (NOTE) SARS-CoV-2 target nucleic acids are NOT DETECTED.  The SARS-CoV-2 RNA is generally detectable in upper respiratory specimens during the acute phase of infection. The lowest concentration of SARS-CoV-2 viral copies this assay can detect is 138 copies/mL. A negative result does not preclude SARS-Cov-2 infection and should not be used as the sole basis for treatment or other patient management decisions. A negative result may occur with  improper specimen collection/handling, submission of specimen other than nasopharyngeal swab, presence of viral mutation(s) within the areas targeted by this assay, and inadequate number of viral copies(<138 copies/mL). A negative result must be combined with clinical observations, patient history, and epidemiological information. The expected result is Negative.  Fact Sheet for Patients:  EntrepreneurPulse.com.au  Fact Sheet for Healthcare Providers:  IncredibleEmployment.be  This test is no t yet approved or cleared by the [place] FDA and  has been authorized for detection and/or diagnosis of SARS-CoV-2 by FDA under an Emergency Use Authorization (EUA). This EUA will remain  in effect (meaning this test can be used) for the duration of the COVID-19 declaration under Section 564(b)(1) of the Act, 21 U.S.C.section 360bbb-3(b)(1),  unless the authorization is terminated  or revoked sooner.       Influenza A by PCR NEGATIVE NEGATIVE Final   Influenza B by PCR NEGATIVE NEGATIVE Final    Comment: (NOTE) The Xpert Xpress SARS-CoV-2/FLU/RSV plus assay is intended as an aid in the diagnosis of influenza from Nasopharyngeal swab specimens and should not be used as a sole basis for treatment. Nasal washings and aspirates are unacceptable for Xpert Xpress SARS-CoV-2/FLU/RSV testing.  Fact Sheet for Patients: EntrepreneurPulse.com.au  Fact Sheet for Healthcare Providers: IncredibleEmployment.be  This test is not yet approved or cleared by the [place] FDA and has been authorized for detection and/or diagnosis of SARS-CoV-2 by FDA under an Emergency Use Authorization (EUA). This EUA will remain in effect (meaning this test can be used) for the duration of the COVID-19 declaration under Section 564(b)(1) of the Act, 21 U.S.C. section 360bbb-3(b)(1), unless the authorization is terminated or revoked.  Performed at [facility], [place] [address] [place], [place] [address]       Radiology Studies: IR Fluoro Guide CV Line Right  Result Date: 06/02/2020 INDICATION: 30 year old with renal failure and needs hemodialysis catheter. EXAM: FLUOROSCOPIC AND ULTRASOUND GUIDED PLACEMENT OF A TUNNELED DIALYSIS CATHETER Physician: Stephan Minister. Anselm Pancoast, MD MEDICATIONS: Ancef 2 g; The antibiotic was administered within an appropriate time interval prior to skin puncture. ANESTHESIA/SEDATION: Versed 1.0 mg IV; Fentanyl 50 mcg IV; Moderate Sedation Time:  28 minutes The patient was continuously monitored during the procedure by the interventional radiology nurse under my direct supervision. FLUOROSCOPY TIME:  42 seconds, 2 mGy COMPLICATIONS: None immediate. PROCEDURE: The procedure was explained to the patient. The risks and benefits of the procedure were discussed and the patient's  questions were addressed. Informed consent was obtained from the patient. The patient was placed supine on the interventional table. Ultrasound confirmed a patent right internal jugular vein. Ultrasound images were obtained for documentation. The right neck and chest was prepped and  draped in a sterile fashion. Maximal barrier sterile technique was utilized including caps, mask, sterile gowns, sterile gloves, sterile drape, hand hygiene and skin antiseptic. The right neck was anesthetized with 1% lidocaine. A small incision was made with #11 blade scalpel. A 21 gauge needle directed into the right internal jugular vein with ultrasound guidance. A micropuncture dilator set was placed. A 19 cm tip to cuff Palindrome catheter was selected. The skin below the right clavicle was anesthetized and a small incision was made with an #11 blade scalpel. A subcutaneous tunnel was formed to the vein dermatotomy site. The catheter was brought through the tunnel. The vein dermatotomy site was dilated to accommodate a peel-away sheath. The catheter was placed through the peel-away sheath and directed into the central venous structures. The tip of the catheter was placed at the superior cavoatrial junction with fluoroscopy. Fluoroscopic images were obtained for documentation. Both lumens were found to aspirate and flush well. The proper amount of heparin was flushed in both lumens. The vein dermatotomy site was closed using a single layer of absorbable suture and Dermabond. Surgifoam was placed in the subcutaneous tract. The catheter was secured to the skin using Prolene suture. IMPRESSION: Successful placement of a right jugular tunneled dialysis catheter using ultrasound and fluoroscopic guidance. Electronically Signed   By: Markus Daft M.D.   On: 06/02/2020 17:31   IR US Guide Vasc Access Right  Result Date: 06/02/2020 INDICATION: 30 year old with renal failure and needs hemodialysis catheter. EXAM: FLUOROSCOPIC AND ULTRASOUND  GUIDED PLACEMENT OF A TUNNELED DIALYSIS CATHETER Physician: Stephan Minister. Anselm Pancoast, MD MEDICATIONS: Ancef 2 g; The antibiotic was administered within an appropriate time interval prior to skin puncture. ANESTHESIA/SEDATION: Versed 1.0 mg IV; Fentanyl 50 mcg IV; Moderate Sedation Time:  28 minutes The patient was continuously monitored during the procedure by the interventional radiology nurse under my direct supervision. FLUOROSCOPY TIME:  42 seconds, 2 mGy COMPLICATIONS: None immediate. PROCEDURE: The procedure was explained to the patient. The risks and benefits of the procedure were discussed and the patient's questions were addressed. Informed consent was obtained from the patient. The patient was placed supine on the interventional table. Ultrasound confirmed a patent right internal jugular vein. Ultrasound images were obtained for documentation. The right neck and chest was prepped and draped in a sterile fashion. Maximal barrier sterile technique was utilized including caps, mask, sterile gowns, sterile gloves, sterile drape, hand hygiene and skin antiseptic. The right neck was anesthetized with 1% lidocaine. A small incision was made with #11 blade scalpel. A 21 gauge needle directed into the right internal jugular vein with ultrasound guidance. A micropuncture dilator set was placed. A 19 cm tip to cuff Palindrome catheter was selected. The skin below the right clavicle was anesthetized and a small incision was made with an #11 blade scalpel. A subcutaneous tunnel was formed to the vein dermatotomy site. The catheter was brought through the tunnel. The vein dermatotomy site was dilated to accommodate a peel-away sheath. The catheter was placed through the peel-away sheath and directed into the central venous structures. The tip of the catheter was placed at the superior cavoatrial junction with fluoroscopy. Fluoroscopic images were obtained for documentation. Both lumens were found to aspirate and flush well. The  proper amount of heparin was flushed in both lumens. The vein dermatotomy site was closed using a single layer of absorbable suture and Dermabond. Surgifoam was placed in the subcutaneous tract. The catheter was secured to the skin using Prolene suture. IMPRESSION: Successful  placement of a right jugular tunneled dialysis catheter using ultrasound and fluoroscopic guidance. Electronically Signed   By: Markus Daft M.D.   On: 06/02/2020 17:31    Scheduled Meds: . amLODipine  5 mg Oral Daily  . carvedilol  25 mg Oral BID  . Chlorhexidine Gluconate Cloth  6 each Topical Daily  . Chlorhexidine Gluconate Cloth  6 each Topical Q0600  . cloNIDine  0.2 mg Transdermal Weekly  . darbepoetin (ARANESP) injection - DIALYSIS  60 mcg Intravenous Q Wed-HD  . feeding supplement (NEPRO CARB STEADY)  237 mL Oral BID BM  . furosemide  80 mg Oral Daily  . heparin injection (subcutaneous)  5,000 Units Subcutaneous Q8H  . hydrALAZINE  100 mg Oral TID  . insulin aspart  0-6 Units Subcutaneous TID WC  . insulin glargine  8 Units Subcutaneous Daily  . latanoprost  1 drop Left Eye QHS  . mouth rinse  15 mL Mouth Rinse BID  . metoCLOPramide (REGLAN) injection  5 mg Intravenous Q6H  . multivitamin  1 tablet Oral QHS  . pantoprazole  40 mg Oral Daily  . sertraline  100 mg Oral q morning  . traZODone  50 mg Oral QHS   Continuous Infusions:    LOS: 4 days   Time spent: 35 minutes.  Geradine Girt, DO [facility] [email or web address] 06/04/2020, 12:41 PM

## 2020-06-05 DIAGNOSIS — E1010 Type 1 diabetes mellitus with ketoacidosis without coma: Secondary | ICD-10-CM | POA: Diagnosis not present

## 2020-06-05 LAB — CBC
HCT: 27.4 % — ABNORMAL LOW (ref 36.0–46.0)
Hemoglobin: 8.7 g/dL — ABNORMAL LOW (ref 12.0–15.0)
MCH: 25.6 pg — ABNORMAL LOW (ref 26.0–34.0)
MCHC: 31.8 g/dL (ref 30.0–36.0)
MCV: 80.6 fL (ref 80.0–100.0)
Platelets: 283 10*3/uL (ref 150–400)
RBC: 3.4 MIL/uL — ABNORMAL LOW (ref 3.87–5.11)
RDW: 14.6 % (ref 11.5–15.5)
WBC: 8 10*3/uL (ref 4.0–10.5)
nRBC: 0 % (ref 0.0–0.2)

## 2020-06-05 LAB — BASIC METABOLIC PANEL
Anion gap: 6 (ref 5–15)
BUN: 18 mg/dL (ref 6–20)
CO2: 26 mmol/L (ref 22–32)
Calcium: 7.9 mg/dL — ABNORMAL LOW (ref 8.9–10.3)
Chloride: 103 mmol/L (ref 98–111)
Creatinine, Ser: 4.19 mg/dL — ABNORMAL HIGH (ref 0.44–1.00)
GFR, Estimated: 14 mL/min — ABNORMAL LOW (ref >60)
Glucose, Bld: 141 mg/dL — ABNORMAL HIGH (ref 70–99)
Potassium: 3.2 mmol/L — ABNORMAL LOW (ref 3.5–5.1)
Sodium: 135 mmol/L (ref 135–145)

## 2020-06-05 LAB — GLUCOSE, CAPILLARY
Glucose-Capillary: 149 mg/dL — ABNORMAL HIGH (ref 70–99)
Glucose-Capillary: 150 mg/dL — ABNORMAL HIGH (ref 70–99)
Glucose-Capillary: 289 mg/dL — ABNORMAL HIGH (ref 70–99)
Glucose-Capillary: 116 mg/dL — ABNORMAL HIGH (ref 70–99)
Glucose-Capillary: 242 mg/dL — ABNORMAL HIGH (ref 70–99)
Glucose-Capillary: 140 mg/dL — ABNORMAL HIGH (ref 70–99)
Glucose-Capillary: 157 mg/dL — ABNORMAL HIGH (ref 70–99)

## 2020-06-05 LAB — PARATHYROID HORMONE, INTACT (NO CA): PTH: 226 pg/mL — ABNORMAL HIGH (ref 15–65)

## 2020-06-05 MED ORDER — POTASSIUM CHLORIDE CRYS ER 20 MEQ PO TBCR
40.0000 meq | EXTENDED_RELEASE_TABLET | Freq: Once | ORAL | Status: AC
  Administered 2020-06-05: 40 meq via ORAL
  Filled 2020-06-05: qty 2

## 2020-06-05 MED ORDER — HEPARIN SODIUM (PORCINE) 1000 UNIT/ML IJ SOLN
INTRAMUSCULAR | Status: AC
  Filled 2020-06-05: qty 3

## 2020-06-05 MED ORDER — ACETAMINOPHEN 325 MG PO TABS
650.0000 mg | ORAL_TABLET | Freq: Four times a day (QID) | ORAL | Status: DC | PRN
  Administered 2020-06-05 – 2020-06-07 (×4): 650 mg via ORAL
  Filled 2020-06-05 (×4): qty 2

## 2020-06-05 NOTE — Progress Notes (Signed)
lantus has not arrived from pharmacy. Med missing request sent. Attending notified via secure chat.

## 2020-06-05 NOTE — Progress Notes (Signed)
Nephrology Follow-Up Consult note   Assessment/Recommendations: Barbara Donovan is a/an 30 y.o. female with a past medical history significant for type 1 diabetes and significant CKD, admitted for worsening renal failure now ESRD.     ESRD: Progressive CKD related to type 1 diabetes now ESRD.  She is my clinic patient and we have planned on PD but because of insurance issues she had difficulty getting into the office and ultimately is now admitted. -Continue dialysis today 3/3 initiation sessions; Dialysis again likely Saturday or outpatient if we can get this set up in time -CLIP started; hoping to get her to TCU and switch to PD quickly -Will hold off on VVS consult for now as may not get AVF given plans for PD  Hypertension: chronic issue c/b ESRD.  Continue Lasix and current blood pressure medications.  Manage fluid with dialysis  Anemia due to CKD: hgb 8.7, iron sat 54, continue Darbo 101mg.  Uncontrolled Diabetes Mellitus Type 1 with Hyperglycemia: mgmt per primary  Secondary hyperparathyroidism: PTH elevated in the past.  Phosphorus 4.1.  Calcium 7.7 but corrects to normal.  PTH 226.  No treatment at this time   Recommendations conveyed to primary service.    [facility] 06/05/2020 10:08 AM  ___________________________________________________________  CC: ESRD  Interval History/Subjective: Patient states she feels well today.  Anxious to get things moving regarding peritoneal dialysis.  Has been tolerating dialysis without a significant issue   Medications:  Current Facility-Administered Medications  Medication Dose Route Frequency Provider Last Rate Last Admin  . amLODipine (NORVASC) tablet 5 mg  5 mg Oral Daily GPatrecia Pour MD   5 mg at 06/04/20 0920  . carvedilol (COREG) tablet 25 mg  25 mg Oral BID GPatrecia Pour MD   25 mg at 06/04/20 2124  . Chlorhexidine Gluconate Cloth 2 % PADS 6 each  6 each Topical Daily Kyle, Tyrone A, DO   6  each at 06/04/20 1416  . Chlorhexidine Gluconate Cloth 2 % PADS 6 each  6 each Topical Q0600 SRoney Jaffe MD   6 each at 06/02/20 1900  . cloNIDine (CATAPRES - Dosed in mg/24 hr) patch 0.2 mg  0.2 mg Transdermal Weekly Kyle, Tyrone A, DO   0.2 mg at 05/31/20 1720  . Darbepoetin Alfa (ARANESP) injection 60 mcg  60 mcg Intravenous Q Wed-HD PReesa Chew MD   60 mcg at 06/04/20 1804  . dextrose 50 % solution 0-50 mL  0-50 mL Intravenous PRN KMarylyn Ishihara Tyrone A, DO      . feeding supplement (NEPRO CARB STEADY) liquid 237 mL  237 mL Oral BID BM GPatrecia Pour MD   237 mL at 06/04/20 0934  . furosemide (LASIX) tablet 80 mg  80 mg Oral Daily PReesa Chew MD   80 mg at 06/04/20 1106  . heparin injection 5,000 Units  5,000 Units Subcutaneous Q8H TEugenie Filler MD   5,000 Units at 06/05/20 0602  . hydrALAZINE (APRESOLINE) tablet 100 mg  100 mg Oral TID GPatrecia Pour MD   100 mg at 06/04/20 2124  . HYDROmorphone (DILAUDID) tablet 2 mg  2 mg Oral Q3H PRN GPatrecia Pour MD   2 mg at 06/03/20 0144  . insulin aspart (novoLOG) injection 0-6 Units  0-6 Units Subcutaneous TID WC TEugenie Filler MD   1 Units at 06/04/20 1130  . insulin glargine (LANTUS) injection 8 Units  8 Units Subcutaneous Daily GPatrecia Pour MD  8 Units at 06/04/20 0919  . labetalol (NORMODYNE) injection 10 mg  10 mg Intravenous Q2H PRN Patrecia Pour, MD   10 mg at 06/04/20 2125  . latanoprost (XALATAN) 0.005 % ophthalmic solution 1 drop  1 drop Left Eye QHS Eugenie Filler, MD   1 drop at 06/04/20 2125  . MEDLINE mouth rinse  15 mL Mouth Rinse BID Marylyn Ishihara, Tyrone A, DO   15 mL at 06/04/20 2200  . metoCLOPramide (REGLAN) injection 5 mg  5 mg Intravenous Q6H PRN Eulogio Bear U, DO      . multivitamin (RENA-VIT) tablet 1 tablet  1 tablet Oral QHS Patrecia Pour, MD   1 tablet at 06/04/20 2124  . pantoprazole (PROTONIX) EC tablet 40 mg  40 mg Oral Daily Kris Mouton, RPH   40 mg at 06/04/20 0919  . potassium chloride SA  (KLOR-CON) CR tablet 40 mEq  40 mEq Oral Once Reesa Chew, MD      . prochlorperazine (COMPAZINE) injection 10 mg  10 mg Intravenous Q6H PRN Eugenie Filler, MD   10 mg at 06/02/20 1017  . sertraline (ZOLOFT) tablet 100 mg  100 mg Oral q morning Eugenie Filler, MD   100 mg at 06/04/20 0919  . traZODone (DESYREL) tablet 50 mg  50 mg Oral QHS Eugenie Filler, MD   50 mg at 06/04/20 2124      Review of Systems: 10 systems reviewed and negative except per interval history/subjective  Physical Exam: Vitals:   06/05/20 0945 06/05/20 1000  BP: (!) 186/107 (!) 198/123  Pulse: 81 82  Resp:    Temp:    SpO2:     No intake/output data recorded.  Intake/Output Summary (Last 24 hours) at 06/05/2020 1008 Last data filed at 06/04/2020 2000 Gross per 24 hour  Intake 897 ml  Output 1987 ml  Net -1090 ml   Constitutional: well-appearing, no acute distress ENMT: ears and nose without scars or lesions, MMM CV: normal rate, no edema  Respiratory: Bilateral chest rise, normal work of breathing Gastrointestinal: soft, non-tender, no palpable masses or hernias Skin: no visible lesions or rashes Psych: alert, judgement/insight appropriate, appropriate mood and affect   Test Results I personally reviewed new and old clinical labs and radiology tests Lab Results  Component Value Date   NA 135 06/05/2020   K 3.2 (L) 06/05/2020   CL 103 06/05/2020   CO2 26 06/05/2020   BUN 18 06/05/2020   CREATININE 4.19 (H) 06/05/2020   GFR 15.72 (L) 12/21/2019   CALCIUM 7.9 (L) 06/05/2020   ALBUMIN 1.9 (L) 06/04/2020   PHOS 4.1 06/04/2020

## 2020-06-05 NOTE — Progress Notes (Addendum)
Blood sugar 289 this morning. Dialysis department requested not to cover with SSI since they are sending transport for her soon. MD notified and verbalizes agreement with holding SSI. Lantus insulin has not been delivered from pharmacy at this time.

## 2020-06-05 NOTE — Progress Notes (Signed)
Patient is a type 1 DM-- needs lantus

## 2020-06-05 NOTE — Progress Notes (Signed)
   06/05/20 1602  Vitals  BP (!) 200/108  MAP (mmHg) 131   BP as stated above, confirmed with attending MD to give both scheduled hydralazine and PRN labetolol.

## 2020-06-05 NOTE — Progress Notes (Signed)
Referral completed for Transitional Care Unit. Navigator learned today that patient's [facility] insurance that is on file in Epic is no longer active. She states she got a new job with [employer] on 05/07/20 and that insurance through her employer will be effective as of 07/07/20. Navigator obtained a comprehensive work hx on patient for approximately the last 10 years to provide to [employer] team to help expedite clearance. It appears to Navigator that she should qualify for Medicare based on work hx and dx of ESRD, however and unfortunately, we will have to await financial clearance before she can discharge and start outpatient HD. Patient is understanding. Navigator has updated Nephrologist. She states she has her own transportation to [facility] and is aware that it is 4x per week, 3 hours per tx. Navigator will follow closely.   Alphonzo Cruise, [place] [facility] Navigator [phone]

## 2020-06-05 NOTE — Progress Notes (Signed)
PROGRESS NOTE  Barbara Donovan  [id number] DOB: [birth year] DOA: 05/31/2020 PCP: Fanny Bien, MD    Brief Narrative: Barbara Donovan is a 30 y.o. female with a history of T2DM, HTN, and stage V CKD who presented to the ED 5/14 with nausea and vomiting found to be in DKA with hypertensive urgency as well as acute on chronic renal failure. Nicardipine infusion and insulin infusions started, and patient admitted with nephrology recommending transfer to [facility] for initiation of iHD. TDC was placed 5/16, dialysis initiated 5/17. Insulin infusion has been transitioned to basal-bolus insulin with sufficient control. Antihypertensives are being titrated as nicardipine infusion is weaned off.   Assessment & Plan: Principal Problem:   DKA (diabetic ketoacidosis) ([place]) Active Problems:   Diabetic retinopathy ([place])   Acute depression   Chronic hypertension   Nephropathy, diabetic ([place])   Intractable nausea and vomiting   Hypertensive urgency   Dehydration   Anemia due to stage 5 chronic kidney disease (HCC)   Renal failure (ARF), acute on chronic (HCC)   ARF (acute renal failure) ([place])    ARF on stage V CKD with uremic symptoms (nausea/vomiting): Plan ultimately is PD.  - HD started 5/17 per nephrology.  NAGMA:  - resolved  AOCKD:  - Iron studies normal -transfuse for Hbg <7  T1DM with DKA POA: DKA resolved. HbA1c 7.5%. - Continue lantus -SSI - Carb-mod diet  HTN urgency:  - weaned from nicardipine - Continue clonidine patch - Continue increased hydralazine '100mg'$  TID. - coreg  - PRN  Depression:  - Continue SSRI   DVT prophylaxis: Heparin Code Status: Full Family Communication: None at bedside Disposition Plan:  Status is: Inpatient - Remains inpatient appropriate because:Inpatient level of care appropriate due to severity of illness  Dispo: The patient is from: Home              Anticipated d/c is to: Home              Patient currently is not medically  stable to d/c.-- can d/c on PD outpatient set up   Difficult to place patient No  Consultants:   Nephrology  IR  Procedures:   Right IJ TDC placement 06/02/2020  Dialysis    Subjective: No SOB, no CP-- asking about going home  Objective: Vitals:   06/05/20 0930 06/05/20 0945 06/05/20 1000 06/05/20 1030  BP: (!) 201/107 (!) 186/107 (!) 198/123 (!) 199/125  Pulse: 62 81 82 78  Resp:      Temp:      TempSrc:      SpO2:      Weight:      Height:        Intake/Output Summary (Last 24 hours) at 06/05/2020 1043 Last data filed at 06/04/2020 2000 Gross per 24 hour  Intake 897 ml  Output 1987 ml  Net -1090 ml   Filed Weights   06/03/20 0630 06/03/20 0917 06/05/20 0920  Weight: 74.2 kg 74.2 kg 69.8 kg     General: Appearance:    Chronically ill appearing female in no acute distress     Lungs:      respirations unlabored  Heart:    Normal heart rate. Normal rhythm. No murmurs, rubs, or gallops.   MS:   All extremities are intact.   Neurologic:   Awake, alert, oriented x 3. No apparent focal neurological           defect.    Data Reviewed: I have personally reviewed following  labs and imaging studies  CBC: Recent Labs  Lab 05/31/20 1223 06/01/20 0836 06/02/20 0336 06/04/20 0610 06/05/20 0329  WBC 10.2 12.5* 13.6* 8.3 8.0  NEUTROABS  --  9.4* 9.8*  --   --   HGB 11.8* 10.0* 9.3* 8.7* 8.7*  HCT 37.0 31.9* 28.6* 27.3* 27.4*  MCV 79.9* 80.6 79.9* 80.5 80.6  PLT 577* 541* 492* 292 [id number]   Basic Metabolic Panel: Recent Labs  Lab 06/01/20 1245 06/02/20 0336 06/02/20 0848 06/03/20 1642 06/04/20 0610 06/05/20 0329  NA  --  138 139 135 133* 135  K  --  3.8 3.9 3.4* 3.6 3.2*  CL  --  109 111 106 104 103  CO2  --  17* 17* '22 22 26  '$ GLUCOSE  --  173* 178* 105* 131* 141*  BUN  --  45* 43* 27* 31* 18  CREATININE  --  6.78* 7.00* 4.80* 5.09* 4.19*  CALCIUM  --  8.5* 8.4* 8.1* 7.7* 7.9*  PHOS 3.3 5.6*  --  4.0 4.1  --    GFR: Estimated Creatinine Clearance: 18.5  mL/min (A) (by C-G formula based on SCr of 4.19 mg/dL (H)). Liver Function Tests: Recent Labs  Lab 05/31/20 1223 06/02/20 0336 06/03/20 1642 06/04/20 0610  AST 19  --   --   --   ALT 14  --   --   --   ALKPHOS 67  --   --   --   BILITOT 0.7  --   --   --   PROT 8.0  --   --   --   ALBUMIN 3.3* 2.5* 2.0* 1.9*   Recent Labs  Lab 05/31/20 1223  LIPASE 35   No results for input(s): AMMONIA in the last 168 hours. Coagulation Profile: No results for input(s): INR, PROTIME in the last 168 hours. Cardiac Enzymes: No results for input(s): CKTOTAL, CKMB, CKMBINDEX, TROPONINI in the last 168 hours. BNP (last 3 results) No results for input(s): PROBNP in the last 8760 hours. HbA1C: No results for input(s): HGBA1C in the last 72 hours. CBG: Recent Labs  Lab 06/04/20 2128 06/04/20 2257 06/05/20 0113 06/05/20 0454 06/05/20 0817  GLUCAP 162* 141* 149* 150* 289*   Lipid Profile: No results for input(s): CHOL, HDL, LDLCALC, TRIG, CHOLHDL, LDLDIRECT in the last 72 hours. Thyroid Function Tests: No results for input(s): TSH, T4TOTAL, FREET4, T3FREE, THYROIDAB in the last 72 hours. Anemia Panel: No results for input(s): VITAMINB12, FOLATE, FERRITIN, TIBC, IRON, RETICCTPCT in the last 72 hours. Urine analysis:    Component Value Date/Time   COLORURINE YELLOW 06/01/2020 0530   APPEARANCEUR HAZY (A) 06/01/2020 0530   LABSPEC 1.012 06/01/2020 0530   PHURINE 6.0 06/01/2020 0530   GLUCOSEU >=500 (A) 06/01/2020 0530   HGBUR NEGATIVE 06/01/2020 0530   BILIRUBINUR NEGATIVE 06/01/2020 0530   KETONESUR 20 (A) 06/01/2020 0530   PROTEINUR >=300 (A) 06/01/2020 0530   UROBILINOGEN 0.2 08/14/2019 1051   NITRITE NEGATIVE 06/01/2020 0530   LEUKOCYTESUR NEGATIVE 06/01/2020 0530   Recent Results (from the past 240 hour(s))  MRSA PCR Screening     Status: None   Collection Time: 05/31/20  4:13 PM   Specimen: Nasal Mucosa; Nasopharyngeal  Result Value Ref Range Status   MRSA by PCR NEGATIVE  NEGATIVE Final    Comment:        The GeneXpert MRSA Assay (FDA approved for NASAL specimens only), is one component of a comprehensive MRSA colonization surveillance program. It is not intended  to diagnose MRSA infection nor to guide or monitor treatment for MRSA infections. Performed at [facility], [place] [address] [place], [place] [address]   Resp Panel by RT-PCR (Flu A&B, Covid) Nasopharyngeal Swab     Status: None   Collection Time: 05/31/20  7:00 PM   Specimen: Nasopharyngeal Swab; Nasopharyngeal(NP) swabs in vial transport medium  Result Value Ref Range Status   SARS Coronavirus 2 by RT PCR NEGATIVE NEGATIVE Final    Comment: (NOTE) SARS-CoV-2 target nucleic acids are NOT DETECTED.  The SARS-CoV-2 RNA is generally detectable in upper respiratory specimens during the acute phase of infection. The lowest concentration of SARS-CoV-2 viral copies this assay can detect is 138 copies/mL. A negative result does not preclude SARS-Cov-2 infection and should not be used as the sole basis for treatment or other patient management decisions. A negative result may occur with  improper specimen collection/handling, submission of specimen other than nasopharyngeal swab, presence of viral mutation(s) within the areas targeted by this assay, and inadequate number of viral copies(<138 copies/mL). A negative result must be combined with clinical observations, patient history, and epidemiological information. The expected result is Negative.  Fact Sheet for Patients:  EntrepreneurPulse.com.au  Fact Sheet for Healthcare Providers:  IncredibleEmployment.be  This test is no t yet approved or cleared by the [place] FDA and  has been authorized for detection and/or diagnosis of SARS-CoV-2 by FDA under an Emergency Use Authorization (EUA). This EUA will remain  in effect (meaning this test can be used) for the duration of  the COVID-19 declaration under Section 564(b)(1) of the Act, 21 U.S.C.section 360bbb-3(b)(1), unless the authorization is terminated  or revoked sooner.       Influenza A by PCR NEGATIVE NEGATIVE Final   Influenza B by PCR NEGATIVE NEGATIVE Final    Comment: (NOTE) The Xpert Xpress SARS-CoV-2/FLU/RSV plus assay is intended as an aid in the diagnosis of influenza from Nasopharyngeal swab specimens and should not be used as a sole basis for treatment. Nasal washings and aspirates are unacceptable for Xpert Xpress SARS-CoV-2/FLU/RSV testing.  Fact Sheet for Patients: EntrepreneurPulse.com.au  Fact Sheet for Healthcare Providers: IncredibleEmployment.be  This test is not yet approved or cleared by the [place] FDA and has been authorized for detection and/or diagnosis of SARS-CoV-2 by FDA under an Emergency Use Authorization (EUA). This EUA will remain in effect (meaning this test can be used) for the duration of the COVID-19 declaration under Section 564(b)(1) of the Act, 21 U.S.C. section 360bbb-3(b)(1), unless the authorization is terminated or revoked.  Performed at [facility], [place] [address] [place], [place] [address]       Radiology Studies: No results found.  Scheduled Meds: . amLODipine  5 mg Oral Daily  . carvedilol  25 mg Oral BID  . Chlorhexidine Gluconate Cloth  6 each Topical Daily  . Chlorhexidine Gluconate Cloth  6 each Topical Q0600  . cloNIDine  0.2 mg Transdermal Weekly  . darbepoetin (ARANESP) injection - DIALYSIS  60 mcg Intravenous Q Wed-HD  . feeding supplement (NEPRO CARB STEADY)  237 mL Oral BID BM  . furosemide  80 mg Oral Daily  . heparin injection (subcutaneous)  5,000 Units Subcutaneous Q8H  . hydrALAZINE  100 mg Oral TID  . insulin aspart  0-6 Units Subcutaneous TID WC  . insulin glargine  8 Units Subcutaneous Daily  . latanoprost  1 drop Left Eye QHS  . mouth rinse  15 mL Mouth  Rinse BID  .  multivitamin  1 tablet Oral QHS  . pantoprazole  40 mg Oral Daily  . potassium chloride  40 mEq Oral Once  . sertraline  100 mg Oral q morning  . traZODone  50 mg Oral QHS   Continuous Infusions:    LOS: 5 days   Time spent: 25 minutes.  Geradine Girt, DO [facility] [email or web address] 06/05/2020, 10:43 AM

## 2020-06-06 ENCOUNTER — Ambulatory Visit: Payer: 59 | Admitting: Endocrinology

## 2020-06-06 DIAGNOSIS — D631 Anemia in chronic kidney disease: Secondary | ICD-10-CM | POA: Diagnosis not present

## 2020-06-06 DIAGNOSIS — N185 Chronic kidney disease, stage 5: Secondary | ICD-10-CM | POA: Diagnosis not present

## 2020-06-06 DIAGNOSIS — E1010 Type 1 diabetes mellitus with ketoacidosis without coma: Secondary | ICD-10-CM | POA: Diagnosis not present

## 2020-06-06 LAB — GLUCOSE, CAPILLARY
Glucose-Capillary: 189 mg/dL — ABNORMAL HIGH (ref 70–99)
Glucose-Capillary: 253 mg/dL — ABNORMAL HIGH (ref 70–99)
Glucose-Capillary: 140 mg/dL — ABNORMAL HIGH (ref 70–99)
Glucose-Capillary: 213 mg/dL — ABNORMAL HIGH (ref 70–99)

## 2020-06-06 MED ORDER — POLYETHYLENE GLYCOL 3350 17 G PO PACK
17.0000 g | PACK | Freq: Every day | ORAL | Status: DC | PRN
  Administered 2020-06-06 – 2020-06-08 (×2): 17 g via ORAL
  Filled 2020-06-06 (×2): qty 1

## 2020-06-06 MED ORDER — LOSARTAN POTASSIUM 25 MG PO TABS
25.0000 mg | ORAL_TABLET | Freq: Every day | ORAL | Status: DC
  Administered 2020-06-06: 25 mg via ORAL
  Filled 2020-06-06: qty 1

## 2020-06-06 MED ORDER — AMLODIPINE BESYLATE 10 MG PO TABS
10.0000 mg | ORAL_TABLET | Freq: Every day | ORAL | Status: DC
  Administered 2020-06-06 – 2020-06-09 (×4): 10 mg via ORAL
  Filled 2020-06-06 (×4): qty 1

## 2020-06-06 MED ORDER — FUROSEMIDE 10 MG/ML IJ SOLN
120.0000 mg | Freq: Four times a day (QID) | INTRAVENOUS | Status: AC
  Administered 2020-06-06 (×3): 120 mg via INTRAVENOUS
  Filled 2020-06-06 (×3): qty 12

## 2020-06-06 MED ORDER — FUROSEMIDE 80 MG PO TABS
80.0000 mg | ORAL_TABLET | Freq: Every day | ORAL | Status: DC
  Administered 2020-06-07 – 2020-06-09 (×3): 80 mg via ORAL
  Filled 2020-06-06 (×3): qty 1

## 2020-06-06 NOTE — Progress Notes (Signed)
PROGRESS NOTE  Barbara Donovan  [id number] DOB: 1990/02/17 DOA: 05/31/2020 PCP: Fanny Bien, MD    Brief Narrative: Barbara Donovan is a 30 y.o. female with a history of T2DM, HTN, and stage V CKD who presented to the ED 5/14 with nausea and vomiting found to be in DKA with hypertensive urgency as well as acute on chronic renal failure. Nicardipine infusion and insulin infusions started, and patient admitted with nephrology recommending transfer to [facility] for initiation of iHD. TDC was placed 5/16, dialysis initiated 5/17. Insulin infusion has been transitioned to basal-bolus insulin with sufficient control.  Antihypertensives are being titrated and outpatient HD being arranged.   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  - HD started 5/17 per nephrology. -outpatient is being arranged currently  NAGMA:  - resolved  AOCKD:  - Iron studies normal -transfuse for Hbg <7  T1DM with DKA POA: DKA resolved. HbA1c 7.5%. - Continue lantus -SSI - Carb-mod diet  HTN urgency:  - weaned from nicardipine - changes made by nephrology today  Depression:  - Continue SSRI   DVT prophylaxis: Heparin Code Status: Full Family Communication: beside on 5/19 Disposition Plan:  Status is: Inpatient - Remains inpatient appropriate because:Inpatient level of care appropriate due to severity of illness  Dispo: The patient is from: Home              Anticipated d/c is to: Home              Patient currently is not medically stable to d/c.-- can  d/c once HD outpatient set up   Difficult to place patient No  Consultants:   Nephrology  IR  Procedures:   Right IJ TDC placement 06/02/2020  Dialysis    Subjective: Tearful about not seeing her 2 yo child  Objective: Vitals:   06/06/20 0621 06/06/20 0705 06/06/20 1003 06/06/20 1201  BP: (!) 210/121 (!) 177/94 (!) 144/87 (!) 141/85  Pulse: 91   87  Resp:    16  Temp: 98.5 F (36.9 C)   98.3 F (36.8 C)  TempSrc: Oral   Oral  SpO2: 100%   98%  Weight:      Height:        Intake/Output Summary (Last 24 hours) at 06/06/2020 1246 Last data filed at 06/05/2020 1300 Gross per 24 hour  Intake --  Output 3000 ml  Net -3000 ml   Filed Weights   06/03/20 0917 06/05/20 0920 06/05/20 1300  Weight: 74.2 kg 69.8 kg 66.3 kg     General: Appearance:    Chronically ill appearing female in no acute distress     Lungs:     respirations unlabored  Heart:    Normal heart rate. Normal rhythm. No murmurs, rubs, or gallops.   MS:   All extremities are intact.   Neurologic: A+Ox3    Data Reviewed: I have personally reviewed following labs and imaging studies  CBC: Recent Labs  Lab 05/31/20 1223 06/01/20 [id number] 06/02/20 0336 06/04/20 0610 06/05/20 0329  WBC 10.2 12.5* 13.6* 8.3 8.0  NEUTROABS  --  9.4* 9.8*  --   --   HGB 11.8* 10.0* 9.3* 8.7* 8.7*  HCT 37.0 31.9* 28.6* 27.3* 27.4*  MCV 79.9* 80.6 79.9* 80.5 80.6  PLT 577* 541* 492* 292 [id number]   Basic Metabolic Panel: Recent Labs  Lab 06/01/20 1245 06/02/20 0336 06/02/20 0848 06/03/20 1642 06/04/20 0610 06/05/20 0329  NA  --  138 139 135 133* 135  K  --  3.8 3.9 3.4* 3.6 3.2*  CL  --  109 111 106 104 103  CO2  --  17* 17* '22 22 26  '$ GLUCOSE  --  173* 178* 105* 131* 141*  BUN  --  45* 43* 27* 31* 18  CREATININE  --  6.78* 7.00* 4.80* 5.09* 4.19*  CALCIUM  --  8.5* 8.4* 8.1* 7.7* 7.9*  PHOS 3.3 5.6*  --  4.0 4.1  --    GFR: Estimated Creatinine Clearance: 18.5 mL/min (A) (by C-G formula based on SCr of 4.19  mg/dL (H)). Liver Function Tests: Recent Labs  Lab 05/31/20 1223 06/02/20 0336 06/03/20 1642 06/04/20 0610  AST 19  --   --   --   ALT 14  --   --   --   ALKPHOS 67  --   --   --   BILITOT 0.7  --   --   --   PROT 8.0  --   --   --   ALBUMIN 3.3* 2.5* 2.0* 1.9*   Recent Labs  Lab 05/31/20 1223  LIPASE 35   No results for input(s): AMMONIA in the last 168 hours. Coagulation Profile: No results for input(s): INR, PROTIME in the last 168 hours. Cardiac Enzymes: No results for input(s): CKTOTAL, CKMB, CKMBINDEX, TROPONINI in the last 168 hours. BNP (last 3 results) No results for input(s): PROBNP in the last 8760 hours. HbA1C: No results for input(s): HGBA1C in the last 72 hours. CBG: Recent Labs  Lab 06/05/20 1604 06/05/20 2010 06/05/20 2305 06/06/20 0729 06/06/20 1200  GLUCAP 242* 140* 157* 189* 253*   Lipid Profile: No results for input(s): CHOL, HDL, LDLCALC, TRIG, CHOLHDL, LDLDIRECT in the last 72 hours. Thyroid Function Tests: No results for input(s): TSH, T4TOTAL, FREET4, T3FREE, THYROIDAB in the last 72 hours. Anemia Panel: No results for input(s): VITAMINB12, FOLATE, FERRITIN, TIBC, IRON, RETICCTPCT in the last 72 hours. Urine analysis:    Component Value Date/Time   COLORURINE YELLOW 06/01/2020 0530   APPEARANCEUR HAZY (A) 06/01/2020 0530   LABSPEC 1.012 06/01/2020 0530   PHURINE 6.0 06/01/2020 0530   GLUCOSEU >=500 (A) 06/01/2020 0530   HGBUR NEGATIVE 06/01/2020 0530   BILIRUBINUR NEGATIVE 06/01/2020 0530   KETONESUR 20 (A) 06/01/2020 0530   PROTEINUR >=300 (A) 06/01/2020 0530   UROBILINOGEN 0.2 08/14/2019 1051   NITRITE NEGATIVE 06/01/2020 0530   LEUKOCYTESUR NEGATIVE 06/01/2020 0530   Recent Results (from the past 240 hour(s))  MRSA PCR Screening     Status: None   Collection Time: 05/31/20  4:13 PM   Specimen: Nasal Mucosa; Nasopharyngeal  Result Value Ref Range Status   MRSA by PCR NEGATIVE NEGATIVE Final    Comment:        The GeneXpert  MRSA Assay (FDA approved for NASAL specimens only), is one component of a comprehensive MRSA colonization surveillance program. It is not intended to diagnose MRSA infection nor to guide or monitor treatment for MRSA infections. Performed at [facility], [place]  [address] [place], [place] [address]   Resp Panel by RT-PCR (Flu A&B, Covid) Nasopharyngeal Swab     Status: None   Collection Time: 05/31/20  7:00 PM   Specimen: Nasopharyngeal Swab; Nasopharyngeal(NP) swabs in vial transport medium  Result Value Ref Range Status   SARS Coronavirus 2 by RT PCR NEGATIVE NEGATIVE Final    Comment: (NOTE) SARS-CoV-2 target nucleic acids are NOT DETECTED.  The SARS-CoV-2 RNA is generally detectable in upper respiratory specimens during the acute phase of infection. The lowest concentration of SARS-CoV-2 viral copies this assay can detect is 138 copies/mL. A negative result does not preclude SARS-Cov-2 infection and should not be used as the sole basis for treatment or other patient management decisions. A negative result may occur with  improper specimen collection/handling, submission of specimen other than nasopharyngeal swab, presence of viral mutation(s) within the areas targeted by this assay, and inadequate number of viral copies(<138 copies/mL). A negative result must be combined with clinical observations, patient history, and epidemiological information. The expected result is Negative.  Fact Sheet for Patients:  EntrepreneurPulse.com.au  Fact Sheet for Healthcare Providers:  IncredibleEmployment.be  This test is no t yet approved or cleared by the [place] FDA and  has been authorized for detection and/or diagnosis of SARS-CoV-2 by FDA under an Emergency Use Authorization (EUA). This EUA will remain  in effect (meaning this test can be used) for the duration of the COVID-19 declaration under Section 564(b)(1) of the  Act, 21 U.S.C.section 360bbb-3(b)(1), unless the authorization is terminated  or revoked sooner.       Influenza A by PCR NEGATIVE NEGATIVE Final   Influenza B by PCR NEGATIVE NEGATIVE Final    Comment: (NOTE) The Xpert Xpress SARS-CoV-2/FLU/RSV plus assay is intended as an aid in the diagnosis of influenza from Nasopharyngeal swab specimens and should not be used as a sole basis for treatment. Nasal washings and aspirates are unacceptable for Xpert Xpress SARS-CoV-2/FLU/RSV testing.  Fact Sheet for Patients: EntrepreneurPulse.com.au  Fact Sheet for Healthcare Providers: IncredibleEmployment.be  This test is not yet approved or cleared by the [place] FDA and has been authorized for detection and/or diagnosis of SARS-CoV-2 by FDA under an Emergency Use Authorization (EUA). This EUA will remain in effect (meaning this test can be used) for the duration of the COVID-19 declaration under Section 564(b)(1) of the Act, 21 U.S.C. section 360bbb-3(b)(1), unless the authorization is terminated or revoked.  Performed at [facility], [place] [address] [place],  [address]       Radiology Studies: No results found.  Scheduled Meds: . amLODipine  10 mg Oral Daily  . carvedilol  25 mg Oral BID  . Chlorhexidine Gluconate Cloth  6 each Topical Daily  . Chlorhexidine Gluconate Cloth  6 each Topical Q0600  . cloNIDine  0.2 mg Transdermal Weekly  . darbepoetin (ARANESP) injection - DIALYSIS  60 mcg Intravenous Q Wed-HD  . feeding supplement (NEPRO CARB STEADY)  237 mL Oral BID BM  . [START ON 06/07/2020] furosemide  80 mg Oral Daily  . heparin injection (subcutaneous)  5,000 Units Subcutaneous Q8H  . hydrALAZINE  100 mg Oral TID  . insulin aspart  0-6 Units Subcutaneous TID WC  . insulin glargine  8 Units Subcutaneous Daily  . latanoprost  1 drop Left Eye QHS  . losartan  25 mg Oral Daily  . mouth rinse  15 mL Mouth  Rinse BID  . multivitamin  1 tablet Oral QHS  .  pantoprazole  40 mg Oral Daily  . sertraline  100 mg Oral q morning  . traZODone  50 mg Oral QHS   Continuous Infusions: . furosemide 120 mg (06/06/20 0930)     LOS: 6 days   Time spent: 25 minutes.  Geradine Girt, DO [facility] [email or web address] 06/06/2020, 12:46 PM

## 2020-06-06 NOTE — Progress Notes (Signed)
Nephrology Follow-Up Consult note   Assessment/Recommendations: Barbara Donovan is a/an 30 y.o. female with a past medical history significant for type 1 diabetes and significant CKD, admitted for worsening renal failure now ESRD.     ESRD: Progressive CKD related to type 1 diabetes now ESRD.  Ultimate plan for peritoneal dialysis but this will have to happen outside the hospital. -Plan for dialysis tomorrow maintaining TTS schedule for now -CLIP started; hoping to get her to TCU and switch to PD quickly; arrangement of outpatient dialysis delayed due to insurance issues. -Will hold off on VVS consult for now as may not get AVF given plans for PD  Hypertension: chronic issue c/b ESRD.  Increase diuretics today to help with fluid removal.  Increase amlodipine to 10 mg daily.  Add losartan 25 mg daily.  Continue other medications as prescribed  Anemia due to CKD: hgb 8.7, iron sat 54, continue Darbo 79mg.  Uncontrolled Diabetes Mellitus Type 1 with Hyperglycemia: mgmt per primary  Secondary hyperparathyroidism: PTH elevated in the past.  Phosphorus 4.1.  Calcium 7.7 but corrects to normal.  PTH 226.  No treatment at this time   Recommendations conveyed to primary service.    [facility] 06/06/2020 10:46 AM  ___________________________________________________________  CC: ESRD  Interval History/Subjective: Feels okay today. Sad and frustrated with being stuck in the hospital. She missed her 265yo child desperately. She understands we are working to get things set up as quick as possible.   Medications:  Current Facility-Administered Medications  Medication Dose Route Frequency Provider Last Rate Last Admin  . acetaminophen (TYLENOL) tablet 650 mg  650 mg Oral Q6H PRN VAdesola, DupreyU, DO   650 mg at 06/06/20 0844  . amLODipine (NORVASC) tablet 10 mg  10 mg Oral Daily PReesa Chew MD   10 mg at 06/06/20 0844  . carvedilol (COREG) tablet 25 mg   25 mg Oral BID GPatrecia Pour MD   25 mg at 06/06/20 0844  . Chlorhexidine Gluconate Cloth 2 % PADS 6 each  6 each Topical Daily Kyle, Tyrone A, DO   6 each at 06/06/20 0843  . Chlorhexidine Gluconate Cloth 2 % PADS 6 each  6 each Topical Q0600 SRoney Jaffe MD   6 each at 06/06/20 [phone] . cloNIDine (CATAPRES - Dosed in mg/24 hr) patch 0.2 mg  0.2 mg Transdermal Weekly Kyle, Tyrone A, DO   0.2 mg at 05/31/20 1720  . Darbepoetin Alfa (ARANESP) injection 60 mcg  60 mcg Intravenous Q Wed-HD PReesa Chew MD   60 mcg at 06/04/20 1804  . dextrose 50 % solution 0-50 mL  0-50 mL Intravenous PRN KMarylyn Ishihara Tyrone A, DO      . feeding supplement (NEPRO CARB STEADY) liquid 237 mL  237 mL Oral BID BM GVance GatherB, MD   237 mL at 06/06/20 0846  . furosemide (LASIX) 120 mg in dextrose 5 % 50 mL IVPB  120 mg Intravenous Q6H PReesa Chew MD 62 mL/hr at 06/06/20 0930 120 mg at 06/06/20 0930  . [START ON 06/07/2020] furosemide (LASIX) tablet 80 mg  80 mg Oral Daily PReesa Chew MD      . heparin injection 5,000 Units  5,000 Units Subcutaneous Q8H TEugenie Filler MD   5,000 Units at 06/06/20 [phone] . hydrALAZINE (APRESOLINE) tablet 100 mg  100 mg Oral TID GPatrecia Pour MD   100 mg at 06/06/20 0844  . HYDROmorphone (  DILAUDID) tablet 2 mg  2 mg Oral Q3H PRN Patrecia Pour, MD   2 mg at 06/03/20 0144  . insulin aspart (novoLOG) injection 0-6 Units  0-6 Units Subcutaneous TID WC Eugenie Filler, MD   1 Units at 06/06/20 0845  . insulin glargine (LANTUS) injection 8 Units  8 Units Subcutaneous Daily Patrecia Pour, MD   8 Units at 06/06/20 0845  . labetalol (NORMODYNE) injection 10 mg  10 mg Intravenous Q2H PRN Patrecia Pour, MD   10 mg at 06/06/20 [id number]  . latanoprost (XALATAN) 0.005 % ophthalmic solution 1 drop  1 drop Left Eye QHS Eugenie Filler, MD   1 drop at 06/05/20 2151  . losartan (COZAAR) tablet 25 mg  25 mg Oral Daily Reesa Chew, MD   25 mg at 06/06/20 0844  . MEDLINE mouth rinse   15 mL Mouth Rinse BID Marylyn Ishihara, Tyrone A, DO   15 mL at 06/06/20 0845  . metoCLOPramide (REGLAN) injection 5 mg  5 mg Intravenous Q6H PRN Eulogio Bear U, DO   5 mg at 06/05/20 2313  . multivitamin (RENA-VIT) tablet 1 tablet  1 tablet Oral QHS Patrecia Pour, MD   1 tablet at 06/05/20 2150  . pantoprazole (PROTONIX) EC tablet 40 mg  40 mg Oral Daily Kris Mouton, RPH   40 mg at 06/06/20 0844  . prochlorperazine (COMPAZINE) injection 10 mg  10 mg Intravenous Q6H PRN Eugenie Filler, MD   10 mg at 06/02/20 1017  . sertraline (ZOLOFT) tablet 100 mg  100 mg Oral q morning Eugenie Filler, MD   100 mg at 06/06/20 0844  . traZODone (DESYREL) tablet 50 mg  50 mg Oral QHS Eugenie Filler, MD   50 mg at 06/05/20 2151      Review of Systems: 10 systems reviewed and negative except per interval history/subjective  Physical Exam: Vitals:   06/06/20 0705 06/06/20 1003  BP: (!) 177/94 (!) 144/87  Pulse:    Resp:    Temp:    SpO2:     No intake/output data recorded.  Intake/Output Summary (Last 24 hours) at 06/06/2020 1046 Last data filed at 06/05/2020 1300 Gross per 24 hour  Intake --  Output 3000 ml  Net -3000 ml   Constitutional: well-appearing, no acute distress ENMT: ears and nose without scars or lesions, MMM CV: normal rate, no edema  Respiratory: Bilateral chest rise, normal work of breathing Gastrointestinal: soft, non-tender, no palpable masses or hernias Skin: no visible lesions or rashes Psych: alert, judgement/insight appropriate, appropriate mood and affect   Test Results I personally reviewed new and old clinical labs and radiology tests Lab Results  Component Value Date   NA 135 06/05/2020   K 3.2 (L) 06/05/2020   CL 103 06/05/2020   CO2 26 06/05/2020   BUN 18 06/05/2020   CREATININE 4.19 (H) 06/05/2020   GFR 15.72 (L) 12/21/2019   CALCIUM 7.9 (L) 06/05/2020   ALBUMIN 1.9 (L) 06/04/2020   PHOS 4.1 06/04/2020

## 2020-06-07 DIAGNOSIS — N185 Chronic kidney disease, stage 5: Secondary | ICD-10-CM | POA: Diagnosis not present

## 2020-06-07 DIAGNOSIS — D631 Anemia in chronic kidney disease: Secondary | ICD-10-CM | POA: Diagnosis not present

## 2020-06-07 DIAGNOSIS — E1010 Type 1 diabetes mellitus with ketoacidosis without coma: Secondary | ICD-10-CM | POA: Diagnosis not present

## 2020-06-07 LAB — GLUCOSE, CAPILLARY
Glucose-Capillary: 213 mg/dL — ABNORMAL HIGH (ref 70–99)
Glucose-Capillary: 199 mg/dL — ABNORMAL HIGH (ref 70–99)
Glucose-Capillary: 252 mg/dL — ABNORMAL HIGH (ref 70–99)
Glucose-Capillary: 146 mg/dL — ABNORMAL HIGH (ref 70–99)

## 2020-06-07 MED ORDER — DARBEPOETIN ALFA 60 MCG/0.3ML IJ SOSY: 60.0000 ug | PREFILLED_SYRINGE | INTRAMUSCULAR | Status: DC

## 2020-06-07 MED ORDER — HEPARIN SODIUM (PORCINE) 1000 UNIT/ML IJ SOLN
INTRAMUSCULAR | Status: AC
  Filled 2020-06-07: qty 4

## 2020-06-07 MED ORDER — LOSARTAN POTASSIUM 50 MG PO TABS
50.0000 mg | ORAL_TABLET | Freq: Every day | ORAL | Status: DC
  Administered 2020-06-07: 50 mg via ORAL
  Filled 2020-06-07: qty 1

## 2020-06-07 NOTE — Progress Notes (Signed)
Nephrology Follow-Up Consult note   Assessment/Recommendations: Barbara Donovan is a/an 30 y.o. female with a past medical history significant for type 1 diabetes and significant CKD, admitted for worsening renal failure now ESRD.     ESRD: Progressive CKD related to type 1 diabetes now ESRD.  Ultimate plan for peritoneal dialysis but this will have to happen outside the hospital. -Dialysis today and maintain TTS schedule -CLIP started; hoping to get her to TCU and switch to PD quickly; arrangement of outpatient dialysis delayed due to insurance issues. -Will hold off on VVS consult for now as may not get AVF given plans for PD  Hypertension: chronic issue c/b ESRD.  Increase losartan to 50 mg daily.  Continue Lasix daily.  Continue other medications as prescribed  Anemia due to CKD: hgb 8.7, iron sat 54, continue Darbo 64mg.  Uncontrolled Diabetes Mellitus Type 1 with Hyperglycemia: mgmt per primary  Secondary hyperparathyroidism: Phosphorus 4.1.  Calcium 7.9 but corrects to normal.  PTH 226.  No treatment at this time   Recommendations conveyed to primary service.    [facility] 06/07/2020 8:46 AM  ___________________________________________________________  CC: ESRD  Interval History/Subjective: No complaints.  Wants to go home.   Medications:  Current Facility-Administered Medications  Medication Dose Route Frequency Provider Last Rate Last Admin  . acetaminophen (TYLENOL) tablet 650 mg  650 mg Oral Q6H PRN VTemika, ProthroU, DO   650 mg at 06/07/20 0256  . amLODipine (NORVASC) tablet 10 mg  10 mg Oral Daily PReesa Chew MD   10 mg at 06/06/20 0844  . carvedilol (COREG) tablet 25 mg  25 mg Oral BID GPatrecia Pour MD   25 mg at 06/06/20 2030  . Chlorhexidine Gluconate Cloth 2 % PADS 6 each  6 each Topical Daily Kyle, Tyrone A, DO   6 each at 06/06/20 0843  . Chlorhexidine Gluconate Cloth 2 % PADS 6 each  6 each Topical Q0600 SRoney Jaffe MD   6 each at 06/07/20 0515  . cloNIDine (CATAPRES - Dosed in mg/24 hr) patch 0.2 mg  0.2 mg Transdermal Weekly Kyle, Tyrone A, DO   0.2 mg at 05/31/20 1720  . Darbepoetin Alfa (ARANESP) injection 60 mcg  60 mcg Intravenous Q Wed-HD PReesa Chew MD   60 mcg at 06/04/20 1804  . dextrose 50 % solution 0-50 mL  0-50 mL Intravenous PRN KMarylyn Ishihara Tyrone A, DO      . feeding supplement (NEPRO CARB STEADY) liquid 237 mL  237 mL Oral BID BM GVance GatherB, MD   237 mL at 06/06/20 1454  . furosemide (LASIX) tablet 80 mg  80 mg Oral Daily PReesa Chew MD      . heparin injection 5,000 Units  5,000 Units Subcutaneous Q8H TEugenie Filler MD   5,000 Units at 06/07/20 0515  . hydrALAZINE (APRESOLINE) tablet 100 mg  100 mg Oral TID GPatrecia Pour MD   100 mg at 06/06/20 2029  . HYDROmorphone (DILAUDID) tablet 2 mg  2 mg Oral Q3H PRN GPatrecia Pour MD   2 mg at 06/03/20 0144  . insulin aspart (novoLOG) injection 0-6 Units  0-6 Units Subcutaneous TID WC TEugenie Filler MD   3 Units at 06/06/20 1241  . insulin glargine (LANTUS) injection 8 Units  8 Units Subcutaneous Daily GPatrecia Pour MD   8 Units at 06/06/20 0845  . labetalol (NORMODYNE) injection 10 mg  10 mg Intravenous  Q2H PRN Patrecia Pour, MD   10 mg at 06/06/20 [id number]  . latanoprost (XALATAN) 0.005 % ophthalmic solution 1 drop  1 drop Left Eye QHS Eugenie Filler, MD   1 drop at 06/06/20 2030  . losartan (COZAAR) tablet 50 mg  50 mg Oral Daily Reesa Chew, MD      . MEDLINE mouth rinse  15 mL Mouth Rinse BID Marylyn Ishihara, Tyrone A, DO   15 mL at 06/06/20 2031  . metoCLOPramide (REGLAN) injection 5 mg  5 mg Intravenous Q6H PRN Eulogio Bear U, DO   5 mg at 06/05/20 2313  . multivitamin (RENA-VIT) tablet 1 tablet  1 tablet Oral QHS Patrecia Pour, MD   1 tablet at 06/06/20 2029  . pantoprazole (PROTONIX) EC tablet 40 mg  40 mg Oral Daily Kris Mouton, RPH   40 mg at 06/06/20 0844  . polyethylene glycol (MIRALAX / GLYCOLAX) packet 17  g  17 g Oral Daily PRN Chotiner, Yevonne Aline, MD   17 g at 06/06/20 2130  . prochlorperazine (COMPAZINE) injection 10 mg  10 mg Intravenous Q6H PRN Eugenie Filler, MD   10 mg at 06/07/20 0809  . sertraline (ZOLOFT) tablet 100 mg  100 mg Oral q morning Eugenie Filler, MD   100 mg at 06/06/20 0844  . traZODone (DESYREL) tablet 50 mg  50 mg Oral QHS Eugenie Filler, MD   50 mg at 06/06/20 2030      Review of Systems: 10 systems reviewed and negative except per interval history/subjective  Physical Exam: Vitals:   06/07/20 0829 06/07/20 0830  BP: (!) 157/94 (!) 152/105  Pulse: 80 72  Resp:    Temp:    SpO2:     Total I/O In: -  Out: 900 [Urine:900]  Intake/Output Summary (Last 24 hours) at 06/07/2020 0846 Last data filed at 06/07/2020 0732 Gross per 24 hour  Intake 100.85 ml  Output 2150 ml  Net -2049.15 ml   Constitutional: well-appearing, no acute distress ENMT: ears and nose without scars or lesions, MMM CV: normal rate, no edema  Respiratory: Bilateral chest rise, normal work of breathing Gastrointestinal: soft, non-tender, no palpable masses or hernias Skin: no visible lesions or rashes Psych: alert, judgement/insight appropriate, appropriate mood and affect   Test Results I personally reviewed new and old clinical labs and radiology tests Lab Results  Component Value Date   NA 135 06/05/2020   K 3.2 (L) 06/05/2020   CL 103 06/05/2020   CO2 26 06/05/2020   BUN 18 06/05/2020   CREATININE 4.19 (H) 06/05/2020   GFR 15.72 (L) 12/21/2019   CALCIUM 7.9 (L) 06/05/2020   ALBUMIN 1.9 (L) 06/04/2020   PHOS 4.1 06/04/2020

## 2020-06-07 NOTE — Progress Notes (Signed)
PROGRESS NOTE  KARENLEE FENTON  [id number] DOB: [birth year] DOA: 05/31/2020 PCP: Fanny Bien, MD    Brief Narrative: ZAKIYYAH LIVERGOOD is a 30 y.o. female with a history of T2DM, HTN, and stage V CKD who presented to the ED 5/14 with nausea and vomiting found to be in DKA with hypertensive urgency as well as acute on chronic renal failure. Nicardipine infusion and insulin infusions started, and patient admitted with nephrology recommending transfer to [facility] for initiation of iHD. TDC was placed 5/16, dialysis initiated 5/17. Insulin infusion has been transitioned to basal-bolus insulin with sufficient control.  Antihypertensives are being titrated and outpatient HD being arranged.   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  - HD started 5/17 per nephrology. -outpatient is being arranged currently  NAGMA:  - resolved  AOCKD:  - Iron studies normal -transfuse for Hbg <7  T1DM with DKA POA: DKA resolved. HbA1c 7.5%. - Continue lantus -SSI - Carb-mod diet  HTN urgency:  - weaned from nicardipine - changes made by nephrology today  Depression:  - Continue SSRI   DVT prophylaxis: Heparin Code Status: Full Family Communication: bedside on 5/19 Disposition Plan:  Status is: Inpatient - Remains inpatient appropriate because:Inpatient level of care appropriate due to severity of illness  Dispo: The patient is from: Home              Anticipated d/c is to: Home              Patient currently is not medically stable to d/c.-- can  d/c once HD outpatient set up- suspect Monday   Difficult to place patient No  Consultants:   Nephrology  IR  Procedures:   Right IJ TDC placement 06/02/2020  Dialysis    Subjective: In HD  Objective: Vitals:   06/07/20 1100 06/07/20 1130 06/07/20 1159 06/07/20 1215  BP: 123/84 111/83 131/85 136/80  Pulse: 79 83 76 62  Resp:   16   Temp:   98.6 F (37 C)   TempSrc:   Oral   SpO2:   100%   Weight:   61.5 kg   Height:        Intake/Output Summary (Last 24 hours) at 06/07/2020 1308 Last data filed at 06/07/2020 1159 Gross per 24 hour  Intake 100.85 ml  Output 5150 ml  Net -5049.15 ml   Filed Weights   06/05/20 1300 06/07/20 0822 06/07/20 1159  Weight: 66.3 kg 64.3 kg 61.5 kg     General: Appearance:    Chronically ill appearing female in no acute distress, in HD       Data Reviewed: I have personally reviewed following labs and imaging studies  CBC: Recent Labs  Lab 06/01/20 0836 06/02/20 0336 06/04/20 0610 06/05/20 0329  WBC 12.5* 13.6* 8.3 8.0  NEUTROABS 9.4* 9.8*  --   --   HGB 10.0* 9.3* 8.7* 8.7*  HCT 31.9* 28.6* 27.3* 27.4*  MCV 80.6 79.9* 80.5 80.6  PLT 541* 492* 292 Q000111Q   Basic Metabolic Panel: Recent  Labs  Lab 06/01/20 1245 06/02/20 0336 06/02/20 0848 06/03/20 1642 06/04/20 0610 06/05/20 0329  NA  --  138 139 135 133* 135  K  --  3.8 3.9 3.4* 3.6 3.2*  CL  --  109 111 106 104 103  CO2  --  17* 17* '22 22 26  '$ GLUCOSE  --  173* 178* 105* 131* 141*  BUN  --  45* 43* 27* 31* 18  CREATININE  --  6.78* 7.00* 4.80* 5.09* 4.19*  CALCIUM  --  8.5* 8.4* 8.1* 7.7* 7.9*  PHOS 3.3 5.6*  --  4.0 4.1  --    GFR: Estimated Creatinine Clearance: 18.5 mL/min (A) (by C-G formula based on SCr of 4.19 mg/dL (H)). Liver Function Tests: Recent Labs  Lab 06/02/20 0336 06/03/20 1642 06/04/20 0610  ALBUMIN 2.5* 2.0* 1.9*   No results for input(s): LIPASE, AMYLASE in the last 168 hours. No results for input(s): AMMONIA in the last 168  hours. Coagulation Profile: No results for input(s): INR, PROTIME in the last 168 hours. Cardiac Enzymes: No results for input(s): CKTOTAL, CKMB, CKMBINDEX, TROPONINI in the last 168 hours. BNP (last 3 results) No results for input(s): PROBNP in the last 8760 hours. HbA1C: No results for input(s): HGBA1C in the last 72 hours. CBG: Recent Labs  Lab 06/06/20 1200 06/06/20 1530 06/06/20 2022 06/07/20 0723 06/07/20 1247  GLUCAP 253* 140* 213* 213* 199*   Lipid Profile: No results for input(s): CHOL, HDL, LDLCALC, TRIG, CHOLHDL, LDLDIRECT in the last 72 hours. Thyroid Function Tests: No results for input(s): TSH, T4TOTAL, FREET4, T3FREE, THYROIDAB in the last 72 hours. Anemia Panel: No results for input(s): VITAMINB12, FOLATE, FERRITIN, TIBC, IRON, RETICCTPCT in the last 72 hours. Urine analysis:    Component Value Date/Time   COLORURINE YELLOW 06/01/2020 0530   APPEARANCEUR HAZY (A) 06/01/2020 0530   LABSPEC 1.012 06/01/2020 0530   PHURINE 6.0 06/01/2020 0530   GLUCOSEU >=500 (A) 06/01/2020 0530   HGBUR NEGATIVE 06/01/2020 0530   BILIRUBINUR NEGATIVE 06/01/2020 0530   KETONESUR 20 (A) 06/01/2020 0530   PROTEINUR >=300 (A) 06/01/2020 0530   UROBILINOGEN 0.2 08/14/2019 1051   NITRITE NEGATIVE 06/01/2020 0530   LEUKOCYTESUR NEGATIVE 06/01/2020 0530   Recent Results (from the past 240 hour(s))  MRSA PCR Screening     Status: None   Collection Time: 05/31/20  4:13 PM   Specimen: Nasal Mucosa; Nasopharyngeal  Result Value Ref Range Status   MRSA by PCR NEGATIVE NEGATIVE Final    Comment:        The GeneXpert MRSA Assay (FDA approved for NASAL specimens only), is one component of a comprehensive MRSA colonization surveillance program. It is not intended to diagnose MRSA infection nor to guide or monitor treatment for MRSA infections. Performed at [facility], [place] [address], [place], [place] [address]   Resp Panel by RT-PCR (Flu A&B, Covid)  Nasopharyngeal Swab     Status: None   Collection Time: 05/31/20  7:00 PM   Specimen: Nasopharyngeal Swab; Nasopharyngeal(NP) swabs in vial transport medium  Result Value Ref Range Status   SARS Coronavirus 2 by RT PCR NEGATIVE NEGATIVE Final    Comment: (NOTE) SARS-CoV-2 target nucleic acids are NOT DETECTED.  The SARS-CoV-2 RNA is generally detectable in upper respiratory specimens during the acute phase of infection. The lowest concentration of SARS-CoV-2 viral copies this assay can detect is 138 copies/mL. A negative result does not preclude SARS-Cov-2 infection and should not be used as the  sole basis for treatment or other patient management decisions. A negative result may occur with  improper specimen collection/handling, submission of specimen other than nasopharyngeal swab, presence of viral mutation(s) within the areas targeted by this assay, and inadequate number of viral copies(<138 copies/mL). A negative result must be combined with clinical observations, patient history, and epidemiological information. The expected result is Negative.  Fact Sheet for Patients:  EntrepreneurPulse.com.au  Fact Sheet for Healthcare Providers:  IncredibleEmployment.be  This test is no t yet approved or cleared by the [place] FDA and  has been authorized for detection and/or diagnosis of SARS-CoV-2 by FDA under an Emergency Use Authorization (EUA). This EUA will remain  in effect (meaning this test can be used) for the duration of the COVID-19 declaration under Section 564(b)(1) of the Act, 21 U.S.C.section 360bbb-3(b)(1), unless the authorization is terminated  or revoked sooner.       Influenza A by PCR NEGATIVE NEGATIVE Final   Influenza B by PCR NEGATIVE NEGATIVE Final    Comment: (NOTE) The Xpert Xpress SARS-CoV-2/FLU/RSV plus assay is intended as an aid in the diagnosis of influenza from Nasopharyngeal swab specimens and should not be  used as a sole basis for treatment. Nasal washings and aspirates are unacceptable for Xpert Xpress SARS-CoV-2/FLU/RSV testing.  Fact Sheet for Patients: EntrepreneurPulse.com.au  Fact Sheet for Healthcare Providers: IncredibleEmployment.be  This test is not yet approved or cleared by the [place] FDA and has been authorized for detection and/or diagnosis of SARS-CoV-2 by FDA under an Emergency Use Authorization (EUA). This EUA will remain in effect (meaning this test can be used) for the duration of the COVID-19 declaration under Section 564(b)(1) of the Act, 21 U.S.C. section 360bbb-3(b)(1), unless the authorization is terminated or revoked.  Performed at [facility], [place] [address], [place], [place] [address]       Radiology Studies: No results found.  Scheduled Meds: . heparin sodium (porcine)      . amLODipine  10 mg Oral Daily  . carvedilol  25 mg Oral BID  . Chlorhexidine Gluconate Cloth  6 each Topical Daily  . cloNIDine  0.2 mg Transdermal Weekly  . [START ON 06/12/2020] darbepoetin (ARANESP) injection - DIALYSIS  60 mcg Intravenous Q Thu-HD  . feeding supplement (NEPRO CARB STEADY)  237 mL Oral BID BM  . furosemide  80 mg Oral Daily  . heparin injection (subcutaneous)  5,000 Units Subcutaneous Q8H  . hydrALAZINE  100 mg Oral TID  . insulin aspart  0-6 Units Subcutaneous TID WC  . insulin glargine  8 Units Subcutaneous Daily  . latanoprost  1 drop Left Eye QHS  . losartan  50 mg Oral Daily  . mouth rinse  15 mL Mouth Rinse BID  . multivitamin  1 tablet Oral QHS  . pantoprazole  40 mg Oral Daily  . sertraline  100 mg Oral q morning  . traZODone  50 mg Oral QHS   Continuous Infusions:    LOS: 7 days   Time spent: 25 minutes.  Geradine Girt, DO [facility] [email or web address] 06/07/2020, 1:08 PM

## 2020-06-08 DIAGNOSIS — E1010 Type 1 diabetes mellitus with ketoacidosis without coma: Secondary | ICD-10-CM | POA: Diagnosis not present

## 2020-06-08 DIAGNOSIS — I10 Essential (primary) hypertension: Secondary | ICD-10-CM | POA: Diagnosis not present

## 2020-06-08 DIAGNOSIS — N185 Chronic kidney disease, stage 5: Secondary | ICD-10-CM | POA: Diagnosis not present

## 2020-06-08 DIAGNOSIS — D631 Anemia in chronic kidney disease: Secondary | ICD-10-CM | POA: Diagnosis not present

## 2020-06-08 LAB — GLUCOSE, CAPILLARY
Glucose-Capillary: 189 mg/dL — ABNORMAL HIGH (ref 70–99)
Glucose-Capillary: 196 mg/dL — ABNORMAL HIGH (ref 70–99)
Glucose-Capillary: 270 mg/dL — ABNORMAL HIGH (ref 70–99)

## 2020-06-08 MED ORDER — LOSARTAN POTASSIUM 50 MG PO TABS
100.0000 mg | ORAL_TABLET | Freq: Every day | ORAL | Status: DC
  Administered 2020-06-08 – 2020-06-09 (×2): 100 mg via ORAL
  Filled 2020-06-08 (×2): qty 2

## 2020-06-08 MED ORDER — CLONIDINE HCL 0.1 MG PO TABS
0.1000 mg | ORAL_TABLET | Freq: Two times a day (BID) | ORAL | Status: AC
  Administered 2020-06-08 (×2): 0.1 mg via ORAL
  Filled 2020-06-08 (×2): qty 1

## 2020-06-08 NOTE — Progress Notes (Signed)
PROGRESS NOTE  [name]  [id number] DOB: 24-Nov-1990 DOA: 05/31/2020 PCP: Fanny Bien, MD    Brief Narrative: Barbara Donovan is a 30 y.o. female with a history of T2DM, HTN, and stage V CKD who presented to the ED 5/14 with nausea and vomiting found to be in DKA with hypertensive urgency as well as acute on chronic renal failure. Nicardipine infusion and insulin infusions started, and patient admitted with nephrology recommending transfer to [facility] for initiation of iHD. TDC was placed 5/16, dialysis initiated 5/17. Insulin infusion has been transitioned to basal-bolus insulin with sufficient control.  Antihypertensives are being titrated and outpatient HD being arranged.   Assessment & Plan: Principal Problem:   DKA (diabetic ketoacidosis) ([place]) Active Problems:   Diabetic retinopathy ([place])   Acute depression   Chronic hypertension   Nephropathy, diabetic ([place])   Intractable nausea and vomiting   Hypertensive urgency   Dehydration   Anemia due to stage 5 chronic kidney disease (HCC)   Renal failure (ARF), acute on chronic (HCC)   ARF (acute renal failure) ([place])    ARF on stage V CKD with uremic symptoms (nausea/vomiting): Plan ultimately is PD.  - HD started 5/17 per nephrology. -outpatient is being arranged currently  NAGMA:  - resolved  AOCKD:  - Iron studies normal -transfuse for Hbg <7  T1DM with DKA POA: DKA resolved. HbA1c 7.5%. - Continue lantus -SSI - Carb-mod diet  HTN urgency:  - weaned from nicardipine - changes made by nephrology today  Depression:  - Continue SSRI   DVT prophylaxis: Heparin Code Status: Full Family Communication: bedside on 5/22 Disposition Plan:  Status is: Inpatient - Remains inpatient appropriate because:Inpatient level of care appropriate due to severity of illness  Dispo: The patient is from: Home              Anticipated d/c is to: Home              Patient currently is not medically stable to d/c.-- can  d/c once HD outpatient set up- suspect Monday   Difficult to place patient No  Consultants:   Nephrology  IR  Procedures:   Right IJ TDC placement 06/02/2020  Dialysis    Subjective: Excited about going home  Objective: Vitals:   06/07/20 1403 06/07/20 2035 06/08/20 0516 06/08/20 0828  BP: 112/74 129/74 139/81 (!) 147/87  Pulse: 85 80 79   Resp: '17 19 19   '$ Temp: 98.7 F (37.1 C) 98 F (36.7 C) 97.8 F (36.6 C)   TempSrc:  Oral Oral   SpO2: 100% 98% 100%   Weight:      Height:        Intake/Output Summary (Last 24 hours) at 06/08/2020 0849 Last data filed at 06/07/2020 1640 Gross per 24 hour  Intake 360 ml  Output 3900 ml  Net -3540 ml   Filed Weights   06/05/20 1300 06/07/20 0822 06/07/20 1159  Weight: 66.3 kg 64.3 kg 61.5 kg      General: Appearance:    Well appearing female in no acute distress     Lungs:     Clear to auscultation bilaterally, respirations unlabored  Heart:    Normal heart rate. Normal rhythm. No murmurs, rubs, or gallops.   MS:   All extremities are intact.   Neurologic:   Awake, alert, oriented x 3   Data Reviewed: I have personally reviewed following labs and imaging studies  CBC: Recent Labs  Lab 06/02/20 0336 06/04/20  [id number] 06/05/20 0329  WBC 13.6* 8.3 8.0  NEUTROABS 9.8*  --   --   HGB 9.3* 8.7* 8.7*  HCT 28.6* 27.3* 27.4*  MCV 79.9* 80.5 80.6  PLT 492* 292 Q000111Q   Basic Metabolic Panel: Recent Labs  Lab 06/01/20 1245 06/02/20 0336 06/02/20 0848 06/03/20 1642 06/04/20 0610 06/05/20 0329  NA  --  138 139 135 133* 135  K  --  3.8 3.9 3.4* 3.6 3.2*  CL  --  109 111 106 104 103  CO2  --  17* 17* '22 22 26  '$ GLUCOSE  --  173* 178* 105* 131* 141*  BUN  --  45* 43* 27* 31* 18  CREATININE  --  6.78* 7.00* 4.80* 5.09* 4.19*  CALCIUM  --  8.5* 8.4* 8.1* 7.7* 7.9*  PHOS 3.3 5.6*  --  4.0 4.1  --    GFR: Estimated Creatinine Clearance: 18.5 mL/min (A) (by C-G formula based on SCr of 4.19 mg/dL (H)). Liver Function  Tests: Recent Labs  Lab 06/02/20 0336 06/03/20 1642 06/04/20 0610  ALBUMIN 2.5* 2.0* 1.9*   No results for input(s): LIPASE, AMYLASE in the last 168 hours. No results for input(s): AMMONIA in the last 168 hours. Coagulation Profile: No results for input(s): INR, PROTIME in the last 168 hours. Cardiac Enzymes: No results for input(s): CKTOTAL, CKMB, CKMBINDEX, TROPONINI in the last 168 hours. BNP (last 3 results) No results for input(s): PROBNP in the last 8760 hours. HbA1C: No results for input(s): HGBA1C in the last 72 hours. CBG: Recent Labs  Lab 06/07/20 0723 06/07/20 1247 06/07/20 1535 06/07/20 2025 06/08/20 0747  GLUCAP 213* 199* 252* 146* 189*   Lipid Profile: No results for input(s): CHOL, HDL, LDLCALC, TRIG, CHOLHDL, LDLDIRECT in the last 72 hours. Thyroid Function Tests: No results for input(s): TSH, T4TOTAL, FREET4, T3FREE, THYROIDAB in the last 72 hours. Anemia Panel: No results for input(s): VITAMINB12, FOLATE, FERRITIN, TIBC, IRON, RETICCTPCT in the last 72 hours. Urine analysis:    Component Value Date/Time   COLORURINE YELLOW 06/01/2020 0530   APPEARANCEUR HAZY (A) 06/01/2020 0530   LABSPEC 1.012 06/01/2020 0530   PHURINE 6.0 06/01/2020 0530   GLUCOSEU >=500 (A) 06/01/2020 0530   HGBUR NEGATIVE 06/01/2020 0530   BILIRUBINUR NEGATIVE 06/01/2020 0530   KETONESUR 20 (A) 06/01/2020 0530   PROTEINUR >=300 (A) 06/01/2020 0530   UROBILINOGEN 0.2 08/14/2019 1051   NITRITE NEGATIVE 06/01/2020 0530   LEUKOCYTESUR NEGATIVE 06/01/2020 0530   Recent Results (from the past 240 hour(s))  MRSA PCR Screening     Status: None   Collection Time: 05/31/20  4:13 PM   Specimen: Nasal Mucosa; Nasopharyngeal  Result Value Ref Range Status   MRSA by PCR NEGATIVE NEGATIVE Final    Comment:        The GeneXpert MRSA Assay (FDA approved for NASAL specimens only), is one component of a comprehensive MRSA colonization surveillance program. It is not intended to diagnose  MRSA infection nor to guide or monitor treatment for MRSA infections. Performed at [facility], [place] [address], [place], [place] [address]   Resp Panel by RT-PCR (Flu A&B, Covid) Nasopharyngeal Swab     Status: None   Collection Time: 05/31/20  7:00 PM   Specimen: Nasopharyngeal Swab; Nasopharyngeal(NP) swabs in vial transport medium  Result Value Ref Range Status   SARS Coronavirus 2 by RT PCR NEGATIVE NEGATIVE Final    Comment: (NOTE) SARS-CoV-2 target nucleic acids are NOT DETECTED.  The SARS-CoV-2  RNA is generally detectable in upper respiratory specimens during the acute phase of infection. The lowest concentration of SARS-CoV-2 viral copies this assay can detect is 138 copies/mL. A negative result does not preclude SARS-Cov-2 infection and should not be used as the sole basis for treatment or other patient management decisions. A negative result may occur with  improper specimen collection/handling, submission of specimen other than nasopharyngeal swab, presence of viral mutation(s) within the areas targeted by this assay, and inadequate number of viral copies(<138 copies/mL). A negative result must be combined with clinical observations, patient history, and epidemiological information. The expected result is Negative.  Fact Sheet for Patients:  EntrepreneurPulse.com.au  Fact Sheet for Healthcare Providers:  IncredibleEmployment.be  This test is no t yet approved or cleared by the [place] FDA and  has been authorized for detection and/or diagnosis of SARS-CoV-2 by FDA under an Emergency Use Authorization (EUA). This EUA will remain  in effect (meaning this test can be used) for the duration of the COVID-19 declaration under Section 564(b)(1) of the Act, 21 U.S.C.section 360bbb-3(b)(1), unless the authorization is terminated  or revoked sooner.       Influenza A by PCR NEGATIVE NEGATIVE Final   Influenza B  by PCR NEGATIVE NEGATIVE Final    Comment: (NOTE) The Xpert Xpress SARS-CoV-2/FLU/RSV plus assay is intended as an aid in the diagnosis of influenza from Nasopharyngeal swab specimens and should not be used as a sole basis for treatment. Nasal washings and aspirates are unacceptable for Xpert Xpress SARS-CoV-2/FLU/RSV testing.  Fact Sheet for Patients: EntrepreneurPulse.com.au  Fact Sheet for Healthcare Providers: IncredibleEmployment.be  This test is not yet approved or cleared by the [place] FDA and has been authorized for detection and/or diagnosis of SARS-CoV-2 by FDA under an Emergency Use Authorization (EUA). This EUA will remain in effect (meaning this test can be used) for the duration of the COVID-19 declaration under Section 564(b)(1) of the Act, 21 U.S.C. section 360bbb-3(b)(1), unless the authorization is terminated or revoked.  Performed at [facility], [place] [address], [place], [place] [address]       Radiology Studies: No results found.  Scheduled Meds: . amLODipine  10 mg Oral Daily  . carvedilol  25 mg Oral BID  . Chlorhexidine Gluconate Cloth  6 each Topical Daily  . cloNIDine  0.1 mg Oral BID  . [START ON 06/12/2020] darbepoetin (ARANESP) injection - DIALYSIS  60 mcg Intravenous Q Thu-HD  . feeding supplement (NEPRO CARB STEADY)  237 mL Oral BID BM  . furosemide  80 mg Oral Daily  . heparin injection (subcutaneous)  5,000 Units Subcutaneous Q8H  . hydrALAZINE  100 mg Oral TID  . insulin aspart  0-6 Units Subcutaneous TID WC  . insulin glargine  8 Units Subcutaneous Daily  . latanoprost  1 drop Left Eye QHS  . losartan  100 mg Oral Daily  . mouth rinse  15 mL Mouth Rinse BID  . multivitamin  1 tablet Oral QHS  . pantoprazole  40 mg Oral Daily  . sertraline  100 mg Oral q morning  . traZODone  50 mg Oral QHS   Continuous Infusions:    LOS: 8 days   Time spent: 25 minutes.  Geradine Girt, DO [facility] [email or web address] 06/08/2020, 8:49 AM

## 2020-06-08 NOTE — Progress Notes (Signed)
Nephrology Follow-Up Consult note   Assessment/Recommendations: Barbara Donovan is a/an 30 y.o. female with a past medical history significant for type 1 diabetes and significant CKD, admitted for worsening renal failure now ESRD.     ESRD: Progressive CKD related to type 1 diabetes now ESRD.  Ultimate plan for peritoneal dialysis but this will have to happen outside the hospital. -Dialysis today and maintain TTS schedule -CLIP started; hoping to get her to TCU and switch to PD quickly; arrangement of outpatient dialysis delayed due to insurance issues. -Will hold off on VVS consult for now as may not get AVF given plans for PD  Hypertension: chronic issue c/b ESRD.  Blood pressure improving.  Increase losartan to 100 mg daily.  Continue Lasix daily.  Switch to oral clonidine and plan to taper as able.  Continue other medications as prescribed  Anemia due to CKD: hgb 8.7, iron sat 54, continue Darbo 46mg. Labs tomorrow  Uncontrolled Diabetes Mellitus Type 1 with Hyperglycemia: mgmt per primary  Secondary hyperparathyroidism: Phosphorus 4.1.  Calcium 7.9 but corrects to normal.  PTH 226.  No treatment at this time   Recommendations conveyed to primary service.    [facility] 06/08/2020 9:06 AM  ___________________________________________________________  CC: ESRD  Interval History/Subjective: Patient continues to have no complaints.  Ready to get dialysis set up so she can go home.   Medications:  Current Facility-Administered Medications  Medication Dose Route Frequency Provider Last Rate Last Admin  . acetaminophen (TYLENOL) tablet 650 mg  650 mg Oral Q6H PRN VKrystian, PeadenU, DO   650 mg at 06/07/20 0256  . amLODipine (NORVASC) tablet 10 mg  10 mg Oral Daily PReesa Chew MD   10 mg at 06/08/20 [id number] . carvedilol (COREG) tablet 25 mg  25 mg Oral BID GPatrecia Pour MD   25 mg at 06/08/20 [id number] . Chlorhexidine Gluconate Cloth 2 % PADS 6  each  6 each Topical Daily Kyle, Tyrone A, DO   6 each at 06/08/20 0830  . cloNIDine (CATAPRES) tablet 0.1 mg  0.1 mg Oral BID PReesa Chew MD   0.1 mg at 06/08/20 [id number] . [START ON 06/12/2020] Darbepoetin Alfa (ARANESP) injection 60 mcg  60 mcg Intravenous Q Thu-HD Vann, Angelize U, DO      . dextrose 50 % solution 0-50 mL  0-50 mL Intravenous PRN KMarylyn Ishihara Tyrone A, DO      . feeding supplement (NEPRO CARB STEADY) liquid 237 mL  237 mL Oral BID BM GVance GatherB, MD   237 mL at 06/08/20 0830  . furosemide (LASIX) tablet 80 mg  80 mg Oral Daily PReesa Chew MD   80 mg at 06/08/20 [id number] . heparin injection 5,000 Units  5,000 Units Subcutaneous Q8H TEugenie Filler MD   5,000 Units at 06/08/20 0425  . hydrALAZINE (APRESOLINE) tablet 100 mg  100 mg Oral TID GPatrecia Pour MD   100 mg at 06/08/20 [id number] . HYDROmorphone (DILAUDID) tablet 2 mg  2 mg Oral Q3H PRN GPatrecia Pour MD   2 mg at 06/03/20 0144  . insulin aspart (novoLOG) injection 0-6 Units  0-6 Units Subcutaneous TID WC TEugenie Filler MD   1 Units at 06/08/20 [phone] . insulin glargine (LANTUS) injection 8 Units  8 Units Subcutaneous Daily GPatrecia Pour MD   8 Units at 06/08/20 [phone] . labetalol (NORMODYNE) injection 10 mg  10 mg  Intravenous Q2H PRN Patrecia Pour, MD   10 mg at 06/06/20 [id number]  . latanoprost (XALATAN) 0.005 % ophthalmic solution 1 drop  1 drop Left Eye QHS Eugenie Filler, MD   1 drop at 06/07/20 2038  . losartan (COZAAR) tablet 100 mg  100 mg Oral Daily Reesa Chew, MD   100 mg at 06/08/20 [id number]  . MEDLINE mouth rinse  15 mL Mouth Rinse BID Marylyn Ishihara, Tyrone A, DO   15 mL at 06/07/20 2038  . metoCLOPramide (REGLAN) injection 5 mg  5 mg Intravenous Q6H PRN Eulogio Bear U, DO   5 mg at 06/05/20 2313  . multivitamin (RENA-VIT) tablet 1 tablet  1 tablet Oral QHS Patrecia Pour, MD   1 tablet at 06/07/20 2037  . pantoprazole (PROTONIX) EC tablet 40 mg  40 mg Oral Daily Kris Mouton, RPH   40 mg at 06/08/20 [id number]  .  polyethylene glycol (MIRALAX / GLYCOLAX) packet 17 g  17 g Oral Daily PRN Chotiner, Yevonne Aline, MD   17 g at 06/06/20 2130  . prochlorperazine (COMPAZINE) injection 10 mg  10 mg Intravenous Q6H PRN Eugenie Filler, MD   10 mg at 06/07/20 0809  . sertraline (ZOLOFT) tablet 100 mg  100 mg Oral q morning Eugenie Filler, MD   100 mg at 06/08/20 [id number]  . traZODone (DESYREL) tablet 50 mg  50 mg Oral QHS Eugenie Filler, MD   50 mg at 06/07/20 2037      Review of Systems: 10 systems reviewed and negative except per interval history/subjective  Physical Exam: Vitals:   06/08/20 0516 06/08/20 0828  BP: 139/81 (!) 147/87  Pulse: 79   Resp: 19   Temp: 97.8 F (36.6 C)   SpO2: 100%    No intake/output data recorded.  Intake/Output Summary (Last 24 hours) at 06/08/2020 0905 Last data filed at 06/07/2020 1640 Gross per 24 hour  Intake 360 ml  Output 3900 ml  Net -3540 ml   Constitutional: well-appearing, no acute distress ENMT: ears and nose without scars or lesions, MMM CV: normal rate Respiratory: Bilateral chest rise, normal work of breathing Gastrointestinal: soft, non-tender, no palpable masses or hernias Skin: no visible lesions or rashes Psych: alert, judgement/insight appropriate, appropriate mood and affect   Test Results I personally reviewed new and old clinical labs and radiology tests Lab Results  Component Value Date   NA 135 06/05/2020   K 3.2 (L) 06/05/2020   CL 103 06/05/2020   CO2 26 06/05/2020   BUN 18 06/05/2020   CREATININE 4.19 (H) 06/05/2020   GFR 15.72 (L) 12/21/2019   CALCIUM 7.9 (L) 06/05/2020   ALBUMIN 1.9 (L) 06/04/2020   PHOS 4.1 06/04/2020

## 2020-06-09 ENCOUNTER — Other Ambulatory Visit (HOSPITAL_COMMUNITY): Payer: Self-pay

## 2020-06-09 DIAGNOSIS — D631 Anemia in chronic kidney disease: Secondary | ICD-10-CM | POA: Diagnosis not present

## 2020-06-09 DIAGNOSIS — N185 Chronic kidney disease, stage 5: Secondary | ICD-10-CM | POA: Diagnosis not present

## 2020-06-09 DIAGNOSIS — E1010 Type 1 diabetes mellitus with ketoacidosis without coma: Secondary | ICD-10-CM | POA: Diagnosis not present

## 2020-06-09 DIAGNOSIS — I10 Essential (primary) hypertension: Secondary | ICD-10-CM | POA: Diagnosis not present

## 2020-06-09 LAB — RENAL FUNCTION PANEL
Albumin: 2.4 g/dL — ABNORMAL LOW (ref 3.5–5.0)
Anion gap: 9 (ref 5–15)
BUN: 32 mg/dL — ABNORMAL HIGH (ref 6–20)
CO2: 25 mmol/L (ref 22–32)
Calcium: 8.8 mg/dL — ABNORMAL LOW (ref 8.9–10.3)
Chloride: 97 mmol/L — ABNORMAL LOW (ref 98–111)
Creatinine, Ser: 5.69 mg/dL — ABNORMAL HIGH (ref 0.44–1.00)
GFR, Estimated: 10 mL/min — ABNORMAL LOW (ref >60)
Glucose, Bld: 152 mg/dL — ABNORMAL HIGH (ref 70–99)
Phosphorus: 5.3 mg/dL — ABNORMAL HIGH (ref 2.5–4.6)
Potassium: 3.5 mmol/L (ref 3.5–5.1)
Sodium: 131 mmol/L — ABNORMAL LOW (ref 135–145)

## 2020-06-09 LAB — GLUCOSE, CAPILLARY
Glucose-Capillary: 195 mg/dL — ABNORMAL HIGH (ref 70–99)
Glucose-Capillary: 236 mg/dL — ABNORMAL HIGH (ref 70–99)

## 2020-06-09 LAB — CBC
HCT: 28.5 % — ABNORMAL LOW (ref 36.0–46.0)
Hemoglobin: 9 g/dL — ABNORMAL LOW (ref 12.0–15.0)
MCH: 25.8 pg — ABNORMAL LOW (ref 26.0–34.0)
MCHC: 31.6 g/dL (ref 30.0–36.0)
MCV: 81.7 fL (ref 80.0–100.0)
Platelets: 349 10*3/uL (ref 150–400)
RBC: 3.49 MIL/uL — ABNORMAL LOW (ref 3.87–5.11)
RDW: 15 % (ref 11.5–15.5)
WBC: 10.8 10*3/uL — ABNORMAL HIGH (ref 4.0–10.5)
nRBC: 0 % (ref 0.0–0.2)

## 2020-06-09 MED ORDER — LATANOPROST 0.005 % OP SOLN: 1.0000 [drp] | Freq: Every day | OPHTHALMIC | 6 refills | Status: DC

## 2020-06-09 MED ORDER — LANTUS SOLOSTAR 100 UNIT/ML ~~LOC~~ SOPN: 10.0000 [IU] | PEN_INJECTOR | SUBCUTANEOUS | 3 refills | Status: DC

## 2020-06-09 MED ORDER — CALCITRIOL 0.25 MCG PO CAPS: 0.2500 ug | ORAL_CAPSULE | ORAL | Status: DC

## 2020-06-09 MED ORDER — LOSARTAN POTASSIUM 50 MG PO TABS
100.0000 mg | ORAL_TABLET | Freq: Every day | ORAL | 0 refills | Status: DC
  Filled 2020-06-09: qty 60, 30d supply, fill #0

## 2020-06-09 MED ORDER — HYDRALAZINE HCL 50 MG PO TABS
100.0000 mg | ORAL_TABLET | Freq: Three times a day (TID) | ORAL | 0 refills | Status: DC
  Filled 2020-06-09: qty 180, 30d supply, fill #0

## 2020-06-09 MED ORDER — FUROSEMIDE 80 MG PO TABS
80.0000 mg | ORAL_TABLET | Freq: Every day | ORAL | 0 refills | Status: DC
  Filled 2020-06-09: qty 30, 30d supply, fill #0

## 2020-06-09 NOTE — Progress Notes (Signed)
Renal Navigator has received financial clearance for patient to treat in the outpatient HD clinic/TCU and can start tomorrow. Navigator met with patient at bedside to discuss and provide written schedule letter. Patient's schedule is M, T, Th, F at 10:20am. She needs to arrive to her appointments at 10:00am. Per attending, she is ready for discharge. Therefore, we will plan for tomorrow, Tuesday, 06/10/20, to be her first day at the outpatient clinic. She needs to arrive at 9:20am on her first day in order to sign consent paperwork prior to her first treatment.  Transitional Care Unit clinic is designed to be 4x per week, 3 hours per treatment for approximately 4 weeks. Patient's plan will be to transition from HD to PD within that time.  Attending and Nephrologist updated and Renal PA asked to send outpatient HD orders to clinic.  Alphonzo Cruise, [place] Renal Navigator [phone]

## 2020-06-09 NOTE — Progress Notes (Signed)
Nephrology Follow-Up note   Assessment/Recommendations: Barbara Donovan is a/an 30 y.o. female with a past medical history significant for type 1 diabetes and significant CKD, admitted for worsening renal failure now ESRD.     # ESRD: Progressive CKD related to type 1 diabetes now ESRD.  Ultimate plan for peritoneal dialysis but this will have to happen outside the hospital. - Continue HD per TTS schedule  -CLIP started; hoping to get her to TCU and switch to PD quickly; arrangement of outpatient dialysis delayed due to insurance issues. - will follow-up with HD SW -Will hold off on VVS consult for now as may not get AVF given plans for PD  # Hypertension: chronic issue c/b ESRD.  Blood pressure improving on current regimen   # Anemia due to CKD:  iron sat 54, continue Darbo 40mg weekly on Thursdays   # Uncontrolled Diabetes Mellitus Type 1 with Hyperglycemia: mgmt per primary  # Secondary hyperparathyroidism:  PTH 226.  Start calcitriol 0.25 mcg three times a week (do not include on med list at discharge as will be given at HD)  Disposition - needs final approval for an outpatient HD unit before able to discharge  ________________________________________________  CC: ESRD  Interval History/Subjective:  She feels well.  Misses her 279year old - baby's father doesn't have facetime.  no urine output recorded for 5/22.  Last HD on 5/21 with 3 kg UF.  She confirms plans to transition to PD outpatient soon hopefully.   Review of systems:  Denies shortness of breath or chest pain  Denies n/v   Medications:  Current Facility-Administered Medications  Medication Dose Route Frequency Provider Last Rate Last Admin  . acetaminophen (TYLENOL) tablet 650 mg  650 mg Oral Q6H PRN VLizanne, ReiU, DO   650 mg at 06/07/20 0256  . amLODipine (NORVASC) tablet 10 mg  10 mg Oral Daily PReesa Chew MD   10 mg at 06/08/20 [id number] . carvedilol (COREG) tablet 25 mg  25 mg Oral BID GPatrecia Pour MD    25 mg at 06/08/20 2006  . Chlorhexidine Gluconate Cloth 2 % PADS 6 each  6 each Topical Daily Kyle, Tyrone A, DO   6 each at 06/08/20 0830  . [START ON 06/12/2020] Darbepoetin Alfa (ARANESP) injection 60 mcg  60 mcg Intravenous Q Thu-HD Vann, Carman U, DO      . dextrose 50 % solution 0-50 mL  0-50 mL Intravenous PRN KMarylyn Ishihara Tyrone A, DO      . feeding supplement (NEPRO CARB STEADY) liquid 237 mL  237 mL Oral BID BM GVance GatherB, MD   237 mL at 06/08/20 1331  . furosemide (LASIX) tablet 80 mg  80 mg Oral Daily PReesa Chew MD   80 mg at 06/08/20 [id number] . heparin injection 5,000 Units  5,000 Units Subcutaneous Q8H TEugenie Filler MD   5,000 Units at 06/09/20 [id number] . hydrALAZINE (APRESOLINE) tablet 100 mg  100 mg Oral TID GPatrecia Pour MD   100 mg at 06/08/20 2007  . HYDROmorphone (DILAUDID) tablet 2 mg  2 mg Oral Q3H PRN GPatrecia Pour MD   2 mg at 06/03/20 0144  . insulin aspart (novoLOG) injection 0-6 Units  0-6 Units Subcutaneous TID WC TEugenie Filler MD   3 Units at 06/08/20 1812  . insulin glargine (LANTUS) injection 8 Units  8 Units Subcutaneous Daily GPatrecia Pour MD   8 Units at 06/08/20 [id number] .  labetalol (NORMODYNE) injection 10 mg  10 mg Intravenous Q2H PRN Patrecia Pour, MD   10 mg at 06/06/20 [id number]  . latanoprost (XALATAN) 0.005 % ophthalmic solution 1 drop  1 drop Left Eye QHS Eugenie Filler, MD   1 drop at 06/08/20 2007  . losartan (COZAAR) tablet 100 mg  100 mg Oral Daily Reesa Chew, MD   100 mg at 06/08/20 [id number]  . MEDLINE mouth rinse  15 mL Mouth Rinse BID Marylyn Ishihara, Tyrone A, DO   15 mL at 06/08/20 2008  . metoCLOPramide (REGLAN) injection 5 mg  5 mg Intravenous Q6H PRN Eulogio Bear U, DO   5 mg at 06/05/20 2313  . multivitamin (RENA-VIT) tablet 1 tablet  1 tablet Oral QHS Patrecia Pour, MD   1 tablet at 06/08/20 2007  . pantoprazole (PROTONIX) EC tablet 40 mg  40 mg Oral Daily Kris Mouton, RPH   40 mg at 06/08/20 [id number]  . polyethylene glycol (MIRALAX /  GLYCOLAX) packet 17 g  17 g Oral Daily PRN Chotiner, Yevonne Aline, MD   17 g at 06/08/20 2005  . prochlorperazine (COMPAZINE) injection 10 mg  10 mg Intravenous Q6H PRN Eugenie Filler, MD   10 mg at 06/07/20 0809  . sertraline (ZOLOFT) tablet 100 mg  100 mg Oral q morning Eugenie Filler, MD   100 mg at 06/08/20 [id number]  . traZODone (DESYREL) tablet 50 mg  50 mg Oral QHS Eugenie Filler, MD   50 mg at 06/08/20 2006     Physical Exam: Vitals:   06/08/20 1248 06/08/20 1951  BP: 114/77 140/83  Pulse: 78 77  Resp: 16 19  Temp: 98.2 F (36.8 C) 98 F (36.7 C)  SpO2: 98% 100%   No intake/output data recorded.  Intake/Output Summary (Last 24 hours) at 06/09/2020 [id number] Last data filed at 06/08/2020 0830 Gross per 24 hour  Intake 240 ml  Output --  Net 240 ml     General adult female in bed in no acute distress HEENT normocephalic atraumatic extraocular movements intact sclera anicteric Neck supple trachea midline Lungs clear to auscultation bilaterally normal work of breathing at rest  Heart S1S2 no rub Abdomen soft nontender nondistended Extremities no edema no cyanosis or clubbing Psych normal mood and affect Neuro - alert and oriented x 3 provides hx and follows commands Access RIJ tunneled catheter   Test Results I personally reviewed new and old clinical labs and radiology tests Lab Results  Component Value Date   NA 131 (L) 06/09/2020   K 3.5 06/09/2020   CL 97 (L) 06/09/2020   CO2 25 06/09/2020   BUN 32 (H) 06/09/2020   CREATININE 5.69 (H) 06/09/2020   GFR 15.72 (L) 12/21/2019   CALCIUM 8.8 (L) 06/09/2020   ALBUMIN 2.4 (L) 06/09/2020   PHOS 5.3 (H) 06/09/2020     Claudia Desanctis, MD 06/09/2020 6:48 AM

## 2020-06-09 NOTE — Progress Notes (Signed)
Rounded on patient today in correlation to transition to outpatient HD. Ordered Kidney Failure book. Patient educated at the bedside regarding care of tunneled dialysis catheter, plan for PD training/TCU,  and proper medication administration on HD days.  Patient also educated on the importance of adhering to scheduled dialysis treatments, the effects of fluid overload, hyperkalemia and hyperphosphatemia. Patient capable of re-verbalizing via teach back method. Also educated patient on services available through the interdisciplinary team in the clinic setting and the differences they will note when transitioning to the outpatient setting from the hospital. Patient with no further questions at this time. Handouts and contact information provided to patient for any further assistance. Will follow as appropriate.   Dorthey Sawyer, RN  Dialysis Nurse Coordinator Phone: [phone]

## 2020-06-09 NOTE — Progress Notes (Signed)
Nutrition Follow-up  DOCUMENTATION CODES:  Not applicable  INTERVENTION:  -Continue Nepro Shake po BID, each supplement provides 425 kcal and 19 grams protein -Continue renal MVI daily -Provided pt with information regarding renal diet  No BM documented since 5/17. If accurate, recommend initiation of bowel regimen.   NUTRITION DIAGNOSIS:  Increased nutrient needs related to chronic illness (CKD V with need for HD) as evidenced by estimated needs -- ongoing  GOAL:  Patient will meet greater than or equal to 90% of their needs -- progressing  MONITOR:  Supplement acceptance,PO intake,Weight trends,Diet advancement,Labs,I & O's  REASON FOR ASSESSMENT:  Rounds    ASSESSMENT:  Patient with PMH significant for type 1 DM, HTN, and stage V CKD. Presents this admission with ARF on CKD V. Now with ESRD.   5/16- s/p tunneled HD catheter 5/17- first HD  Insulin transfusion has been transitioned to basal bolus insulin with sufficient blood sugar control per MD. Antihypertensive medications are being titrated. Outpatient HD is being arranged so pt can discharge. Pt received renal diet education today. RD to provide renal food pyramid. Note plan is ultimately for pt to receive PD.   Pt reports feeling well and expresses excitement to go home. Appetite/intake have improved. 75-100% meal completion x3 documented meals. Pt with orders for Nepro BID and doing well with supplement per RN. Continue current nutrition plan of care.   No BM documented since 5/17. If accurate, recommend initiation of bowel regimen.   Admission weight: 66.4 kg Current weight: 61.5 kg Last HD 5/21, 3L net UF  No UOP documented x24 hours  Medications:  . amLODipine  10 mg Oral Daily  . [START ON 06/10/2020] calcitRIOL  0.25 mcg Oral Q T,Th,Sa-HD  . carvedilol  25 mg Oral BID  . Chlorhexidine Gluconate Cloth  6 each Topical Daily  . [START ON 06/12/2020] darbepoetin (ARANESP) injection - DIALYSIS  60 mcg  Intravenous Q Thu-HD  . feeding supplement (NEPRO CARB STEADY)  237 mL Oral BID BM  . furosemide  80 mg Oral Daily  . heparin injection (subcutaneous)  5,000 Units Subcutaneous Q8H  . hydrALAZINE  100 mg Oral TID  . insulin aspart  0-6 Units Subcutaneous TID WC  . insulin glargine  8 Units Subcutaneous Daily  . latanoprost  1 drop Left Eye QHS  . losartan  100 mg Oral Daily  . mouth rinse  15 mL Mouth Rinse BID  . multivitamin  1 tablet Oral QHS  . pantoprazole  40 mg Oral Daily  . sertraline  100 mg Oral q morning  . traZODone  50 mg Oral QHS   Labs: Recent Labs  Lab 06/03/20 1642 06/04/20 0610 06/05/20 0329 06/09/20 0311  NA 135 133* 135 131*  K 3.4* 3.6 3.2* 3.5  CL 106 104 103 97*  CO2 '22 22 26 25  '$ BUN 27* 31* 18 32*  CREATININE 4.80* 5.09* 4.19* 5.69*  CALCIUM 8.1* 7.7* 7.9* 8.8*  PHOS 4.0 4.1  --  5.3*  GLUCOSE 105* 131* 141* 152*  CBGs: [phone]  Diet Order:   Diet Order            Diet renal/carb modified with fluid restriction Diet-HS Snack? Nothing; Fluid restriction: 2000 mL Fluid; Room service appropriate? Yes; Fluid consistency: Thin  Diet effective now                EDUCATION NEEDS:  Education needs have been addressed  Skin:  Skin Assessment: Reviewed RN Assessment  Last BM:  5/17  Height:  Ht Readings from Last 1 Encounters:  06/02/20 '5\' 6"'$  (1.676 m)   Weight:  Wt Readings from Last 1 Encounters:  06/07/20 61.5 kg   BMI:  Body mass index is 21.88 kg/m.  Estimated Nutritional Needs:  Kcal:  2000-2200 kcal Protein:  100-120 grams Fluid:  1000 ml + UOP    Larkin Ina, MS, RD, LDN RD pager number and weekend/on-call pager number located in [place].

## 2020-06-09 NOTE — Discharge Summary (Signed)
Physician Discharge Summary  Barbara Donovan [id number] DOB: 1990-04-13 DOA: 05/31/2020  PCP: Fanny Bien, MD  Admit date: 05/31/2020 Discharge date: 06/09/2020  Admitted From: home Discharge disposition: home   Recommendations for Outpatient Follow-Up:    HD outpatient  Discharge Diagnosis:   Principal Problem:   DKA (diabetic ketoacidosis) ([place]) Active Problems:   Diabetic retinopathy ([place])   Acute depression   Chronic hypertension   Nephropathy, diabetic ([place])   Intractable nausea and vomiting   Hypertensive urgency   Dehydration   Anemia due to stage 5 chronic kidney disease ([place])   Renal failure (ARF), acute on chronic ([place])   ARF (acute renal failure) ([place])    Discharge Condition: Improved.  Diet recommendation: renal/ Carbohydrate-modified  Wound care: None.  Code status: Full.   History of Present Illness:   Barbara Donovan is a 30 y.o. female with medical history significant of HTN, CKD5, DMt1. Presenting with N/V. She reports that she was in her normal state of health until yesterday morning. She began to feel nauseous shortly after taking her morning meds. She vomited several times during the morning. She tried eating a sandwich later in the day, but was unable to keep that down either. Her N/V continued throughout the evening and into this morning. She felt weak, jittery, and had some mild crampy LLQ ab pain. So she decided to come to the ED for help. She denies any other aggravating or alleviating factors.     Hospital Course by Problem:   ARF on stage V CKD with uremic symptoms (nausea/vomiting): Plan ultimately is PD.  - HD started 5/17 per nephrology. -outpatient has been arranged  NAGMA:  - resolved  AOCKD:  - Iron studies normal -transfuse for Hbg <7  T1DM with DKA POA: DKA resolved. HbA1c 7.5%. - Continue lantus -SSI - Carb-mod diet  HTN urgency:  - continue meds  Depression:  - Continue SSRI   #  Secondary hyperparathyroidism:  PTH 226.  Start calcitriol 0.25 mcg three times a week (do not include on med list at discharge as will be given at HD)  Medical Consultants:    Renal   Discharge Exam:   Vitals:   06/08/20 1951 06/09/20 0852  BP: 140/83 (!) 160/91  Pulse: 77   Resp: 19   Temp: 98 F (36.7 C)   SpO2: 100%    Vitals:   06/08/20 0828 06/08/20 1248 06/08/20 1951 06/09/20 0852  BP: (!) 147/87 114/77 140/83 (!) 160/91  Pulse:  78 77   Resp:  16 19   Temp:  98.2 F (36.8 C) 98 F (36.7 C)   TempSrc:  Oral Oral   SpO2:  98% 100%   Weight:      Height:        General exam: Appears calm and comfortable.   The results of significant diagnostics from this hospitalization (including imaging, microbiology, ancillary and laboratory) are listed below for reference.     Procedures and Diagnostic Studies:   US RENAL  Result Date: 06/01/2020 CLINICAL DATA:  Acute renal failure EXAM: RENAL / URINARY TRACT ULTRASOUND COMPLETE COMPARISON:  Ultrasound kidneys 03/06/2020 FINDINGS: Right Kidney: Renal measurements: 11.3 x 5.4 x 6.4 cm = volume: 202 mL. Diffuse increased echogenicity the renal cortex. No hydronephrosis. No mass. Left Kidney: Renal measurements: 11.9 x 8.0 x 7.0 cm = volume: 348 mL. Diffuse increased echogenicity the renal cortex. No hydronephrosis. No mass. Bladder: Appears normal for degree of bladder distention.  Other: None. IMPRESSION: Diffuse increased echogenicity of the renal cortices without significant thinning, similar to prior exam. Electronically Signed   By: Miachel Roux M.D.   On: 06/01/2020 10:17   DG CHEST PORT 1 VIEW  Result Date: 06/01/2020 CLINICAL DATA:  Nausea and vomiting. EXAM: PORTABLE CHEST 1 VIEW COMPARISON:  04/20/2020 FINDINGS: The heart size and mediastinal contours are within normal limits. Both lungs are clear. The visualized skeletal structures are unremarkable. IMPRESSION: No active disease. Electronically Signed   By: Nolon Nations M.D.   On: 06/01/2020 11:25   DG Abd 2 Views  Result Date: 06/01/2020 CLINICAL DATA:  Nausea and vomiting. EXAM: ABDOMEN - 2 VIEW COMPARISON:  01/28/2018 FINDINGS: Bowel gas pattern is nonobstructed. No free intraperitoneal air or evidence for organomegaly. Visualized osseous structures have a normal appearance. IMPRESSION: No evidence for acute  abnormality. Electronically Signed   By: Nolon Nations M.D.   On: 06/01/2020 11:29     Labs:   Basic Metabolic Panel: Recent Labs  Lab 06/03/20 1642 06/04/20 0610 06/05/20 0329 06/09/20 0311  NA 135 133* 135 131*  K 3.4* 3.6 3.2* 3.5  CL 106 104 103 97*  CO2 '22 22 26 25  '$ GLUCOSE 105* 131* 141* 152*  BUN 27* 31* 18 32*  CREATININE 4.80* 5.09* 4.19* 5.69*  CALCIUM 8.1* 7.7* 7.9* 8.8*  PHOS 4.0 4.1  --  5.3*   GFR Estimated Creatinine Clearance: 13.7 mL/min (A) (by C-G formula based on SCr of 5.69 mg/dL (H)). Liver Function Tests: Recent Labs  Lab 06/03/20 1642 06/04/20 0610 06/09/20 0311  ALBUMIN 2.0* 1.9* 2.4*   No results for input(s): LIPASE, AMYLASE in the last 168 hours. No results for input(s): AMMONIA in the last 168 hours. Coagulation profile No results for input(s): INR, PROTIME in the last 168 hours.  CBC: Recent Labs  Lab 06/04/20 0610 06/05/20 0329 06/09/20 0311  WBC 8.3 8.0 10.8*  HGB 8.7* 8.7* 9.0*  HCT 27.3* 27.4* 28.5*  MCV 80.5 80.6 81.7  PLT 292 283 349   Cardiac Enzymes: No results for input(s): CKTOTAL, CKMB, CKMBINDEX, TROPONINI in the last 168 hours. BNP: Invalid input(s): POCBNP CBG: Recent Labs  Lab 06/07/20 2025 06/08/20 0747 06/08/20 1237 06/08/20 1626 06/09/20 0752  GLUCAP 146* 189* 196* 270* 195*   D-Dimer No results for input(s): DDIMER in the last 72 hours. Hgb A1c No results for input(s): HGBA1C in the last 72 hours. Lipid Profile No results for input(s): CHOL, HDL, LDLCALC, TRIG, CHOLHDL, LDLDIRECT in the last 72 hours. Thyroid function studies No results for  input(s): TSH, T4TOTAL, T3FREE, THYROIDAB in the last 72 hours.  Invalid input(s): FREET3 Anemia work up No results for input(s): VITAMINB12, FOLATE, FERRITIN, TIBC, IRON, RETICCTPCT in the last 72 hours. Microbiology Recent Results (from the past 240 hour(s))  MRSA PCR Screening     Status: None   Collection Time: 05/31/20  4:13 PM   Specimen: Nasal Mucosa; Nasopharyngeal  Result Value Ref Range Status   MRSA by PCR NEGATIVE NEGATIVE Final    Comment:        The GeneXpert MRSA Assay (FDA approved for NASAL specimens only), is one component of a comprehensive MRSA colonization surveillance program. It is not intended to diagnose MRSA infection nor to guide or monitor treatment for MRSA infections. Performed at [facility], [place] [address] [place], [place] [address]   Resp Panel by RT-PCR (Flu A&B, Covid) Nasopharyngeal Swab     Status: None  Collection Time: 05/31/20  7:00 PM   Specimen: Nasopharyngeal Swab; Nasopharyngeal(NP) swabs in vial transport medium  Result Value Ref Range Status   SARS Coronavirus 2 by RT PCR NEGATIVE NEGATIVE Final    Comment: (NOTE) SARS-CoV-2 target nucleic acids are NOT DETECTED.  The SARS-CoV-2 RNA is generally detectable in upper respiratory specimens during the acute phase of infection. The lowest concentration of SARS-CoV-2 viral copies this assay can detect is 138 copies/mL. A negative result does not preclude SARS-Cov-2 infection and should not be used as the sole basis for treatment or other patient management decisions. A negative result may occur with  improper specimen collection/handling, submission of specimen other than nasopharyngeal swab, presence of viral mutation(s) within the areas targeted by this assay, and inadequate number of viral copies(<138 copies/mL). A negative result must be combined with clinical observations, patient history, and epidemiological information. The expected result is  Negative.  Fact Sheet for Patients:  EntrepreneurPulse.com.au  Fact Sheet for Healthcare Providers:  IncredibleEmployment.be  This test is no t yet approved or cleared by the [place] FDA and  has been authorized for detection and/or diagnosis of SARS-CoV-2 by FDA under an Emergency Use Authorization (EUA). This EUA will remain  in effect (meaning this test can be used) for the duration of the COVID-19 declaration under Section 564(b)(1) of the Act, 21 U.S.C.section 360bbb-3(b)(1), unless the authorization is terminated  or revoked sooner.       Influenza A by PCR NEGATIVE NEGATIVE Final   Influenza B by PCR NEGATIVE NEGATIVE Final    Comment: (NOTE) The Xpert Xpress SARS-CoV-2/FLU/RSV plus assay is intended as an aid in the diagnosis of influenza from Nasopharyngeal swab specimens and should not be used as a sole basis for treatment. Nasal washings and aspirates are unacceptable for Xpert Xpress SARS-CoV-2/FLU/RSV testing.  Fact Sheet for Patients: EntrepreneurPulse.com.au  Fact Sheet for Healthcare Providers: IncredibleEmployment.be  This test is not yet approved or cleared by the [place] FDA and has been authorized for detection and/or diagnosis of SARS-CoV-2 by FDA under an Emergency Use Authorization (EUA). This EUA will remain in effect (meaning this test can be used) for the duration of the COVID-19 declaration under Section 564(b)(1) of the Act, 21 U.S.C. section 360bbb-3(b)(1), unless the authorization is terminated or revoked.  Performed at [facility], [place] [address] [place], [place] [address]      Discharge Instructions:   Discharge Instructions    Discharge instructions   Complete by: As directed    Renal/carb mod diet   Increase activity slowly   Complete by: As directed    No wound care   Complete by: As directed      Allergies as of 06/09/2020    No Known Allergies     Medication List    STOP taking these medications   cloNIDine 0.2 mg/24hr patch Commonly known as: CATAPRES - Dosed in mg/24 hr   metoCLOPramide 5 MG tablet Commonly known as: REGLAN   scopolamine 1 MG/3DAYS Commonly known as: TRANSDERM-SCOP   sodium bicarbonate 650 MG tablet     TAKE these medications   amLODipine 10 MG tablet Commonly known as: NORVASC Take 1 tablet (10 mg total) by mouth daily.   carvedilol 25 MG tablet Commonly known as: COREG Take 1 tablet (25 mg total) by mouth 2 (two) times daily.   Continuous Blood Gluc Sensor Misc 1 each by Does not apply route as directed. Use as directed every 14 days. May dispense FreeStyle  [employer] or similar.   furosemide 80 MG tablet Commonly known as: LASIX Take 1 tablet (80 mg total) by mouth daily.   hydrALAZINE 50 MG tablet Commonly known as: APRESOLINE Take 2 tablets (100 mg total) by mouth 3 (three) times daily. What changed:   medication strength  how much to take   insulin lispro 100 UNIT/ML KwikPen Commonly known as: HumaLOG KwikPen Inject 2 Units into the skin 3 (three) times daily with meals.   Lantus SoloStar 100 UNIT/ML Solostar Pen Generic drug: insulin glargine Inject 10 Units into the skin every morning. What changed: how much to take   latanoprost 0.005 % ophthalmic solution Commonly known as: Xalatan Place 1 drop into the left eye at bedtime.   losartan 50 MG tablet Commonly known as: COZAAR Take 2 tablets (100 mg total) by mouth daily.   multivitamin Tabs tablet Take 1 tablet by mouth at bedtime.   pantoprazole 40 MG tablet Commonly known as: PROTONIX Take 1 tablet (40 mg total) by mouth daily.   prochlorperazine 10 MG tablet Commonly known as: COMPAZINE Take 10 mg by mouth every 6 (six) hours as needed for nausea or vomiting.   sertraline 100 MG tablet Commonly known as: ZOLOFT Take 100 mg by mouth every morning.   traZODone 50 MG  tablet Commonly known as: DESYREL Take 50 mg by mouth at bedtime.         Time coordinating discharge: 35 min  Signed:  Geradine Girt DO  [facility] 06/09/2020, 9:08 AM

## 2020-06-09 NOTE — Progress Notes (Signed)
CSW met with pt to discuss readmission risk items.  Pt PCP is Dr Ernie Hew.  Pt has own vehicle for transport to her appts.  Pt has a ride on the way to pick her up from the hospital today. Lurline Idol, MSW, LCSW 5/23/20221:08 PM

## 2020-06-10 ENCOUNTER — Emergency Department (HOSPITAL_COMMUNITY)
Admission: EM | Admit: 2020-06-10 | Discharge: 2020-06-10 | Disposition: A | Discharge status: Home / Self Care | Payer: 59 | Attending: Emergency Medicine | Admitting: Emergency Medicine

## 2020-06-10 ENCOUNTER — Encounter (HOSPITAL_COMMUNITY): Payer: Self-pay

## 2020-06-10 ENCOUNTER — Emergency Department (HOSPITAL_COMMUNITY): Payer: 59

## 2020-06-10 DIAGNOSIS — I120 Hypertensive chronic kidney disease with stage 5 chronic kidney disease or end stage renal disease: Secondary | ICD-10-CM | POA: Diagnosis not present

## 2020-06-10 DIAGNOSIS — K3184 Gastroparesis: Secondary | ICD-10-CM | POA: Insufficient documentation

## 2020-06-10 DIAGNOSIS — Z79899 Other long term (current) drug therapy: Secondary | ICD-10-CM | POA: Insufficient documentation

## 2020-06-10 DIAGNOSIS — N185 Chronic kidney disease, stage 5: Secondary | ICD-10-CM | POA: Diagnosis not present

## 2020-06-10 DIAGNOSIS — Z992 Dependence on renal dialysis: Secondary | ICD-10-CM | POA: Insufficient documentation

## 2020-06-10 DIAGNOSIS — Z794 Long term (current) use of insulin: Secondary | ICD-10-CM | POA: Insufficient documentation

## 2020-06-10 DIAGNOSIS — E1022 Type 1 diabetes mellitus with diabetic chronic kidney disease: Secondary | ICD-10-CM | POA: Diagnosis not present

## 2020-06-10 DIAGNOSIS — E1010 Type 1 diabetes mellitus with ketoacidosis without coma: Secondary | ICD-10-CM | POA: Diagnosis not present

## 2020-06-10 DIAGNOSIS — R112 Nausea with vomiting, unspecified: Secondary | ICD-10-CM | POA: Diagnosis present

## 2020-06-10 DIAGNOSIS — Z8616 Personal history of COVID-19: Secondary | ICD-10-CM | POA: Insufficient documentation

## 2020-06-10 DIAGNOSIS — J45909 Unspecified asthma, uncomplicated: Secondary | ICD-10-CM | POA: Diagnosis not present

## 2020-06-10 LAB — I-STAT VENOUS BLOOD GAS, ED
Acid-base deficit: 1 mmol/L (ref 0.0–2.0)
Bicarbonate: 20.3 mmol/L (ref 20.0–28.0)
Calcium, Ion: 1.09 mmol/L — ABNORMAL LOW (ref 1.15–1.40)
HCT: 36 % (ref 36.0–46.0)
Hemoglobin: 12.2 g/dL (ref 12.0–15.0)
O2 Saturation: 51 %
Potassium: 4.1 mmol/L (ref 3.5–5.1)
Sodium: 129 mmol/L — ABNORMAL LOW (ref 135–145)
TCO2: 21 mmol/L — ABNORMAL LOW (ref 22–32)
pCO2, Ven: 23.7 mmHg — ABNORMAL LOW (ref 44.0–60.0)
pH, Ven: 7.54 — ABNORMAL HIGH (ref 7.250–7.430)
pO2, Ven: 23 mmHg — CL (ref 32.0–45.0)

## 2020-06-10 LAB — I-STAT BETA HCG BLOOD, ED (MC, WL, AP ONLY): I-stat hCG, quantitative: <5 m[IU]/mL (ref <5)

## 2020-06-10 LAB — TROPONIN I (HIGH SENSITIVITY)
Troponin I (High Sensitivity): 18 ng/L — ABNORMAL HIGH (ref <18)
Troponin I (High Sensitivity): 25 ng/L — ABNORMAL HIGH (ref <18)

## 2020-06-10 LAB — CBC WITH DIFFERENTIAL/PLATELET
Abs Immature Granulocytes: 0.13 10*3/uL — ABNORMAL HIGH (ref 0.00–0.07)
Basophils Absolute: 0.1 10*3/uL (ref 0.0–0.1)
Basophils Relative: 1 %
Eosinophils Absolute: 0.1 10*3/uL (ref 0.0–0.5)
Eosinophils Relative: 1 %
HCT: 35.5 % — ABNORMAL LOW (ref 36.0–46.0)
Hemoglobin: 11.7 g/dL — ABNORMAL LOW (ref 12.0–15.0)
Immature Granulocytes: 1 %
Lymphocytes Relative: 16 %
Lymphs Abs: 1.8 10*3/uL (ref 0.7–4.0)
MCH: 26.2 pg (ref 26.0–34.0)
MCHC: 33 g/dL (ref 30.0–36.0)
MCV: 79.6 fL — ABNORMAL LOW (ref 80.0–100.0)
Monocytes Absolute: 0.7 10*3/uL (ref 0.1–1.0)
Monocytes Relative: 6 %
Neutro Abs: 8.3 10*3/uL — ABNORMAL HIGH (ref 1.7–7.7)
Neutrophils Relative %: 75 %
Platelets: 503 10*3/uL — ABNORMAL HIGH (ref 150–400)
RBC: 4.46 MIL/uL (ref 3.87–5.11)
RDW: 15 % (ref 11.5–15.5)
WBC: 11.1 10*3/uL — ABNORMAL HIGH (ref 4.0–10.5)
nRBC: 0 % (ref 0.0–0.2)

## 2020-06-10 LAB — COMPREHENSIVE METABOLIC PANEL
ALT: 12 U/L (ref 0–44)
AST: 21 U/L (ref 15–41)
Albumin: 3.3 g/dL — ABNORMAL LOW (ref 3.5–5.0)
Alkaline Phosphatase: 58 U/L (ref 38–126)
Anion gap: 17 — ABNORMAL HIGH (ref 5–15)
BUN: 38 mg/dL — ABNORMAL HIGH (ref 6–20)
CO2: 19 mmol/L — ABNORMAL LOW (ref 22–32)
Calcium: 9.7 mg/dL (ref 8.9–10.3)
Chloride: 93 mmol/L — ABNORMAL LOW (ref 98–111)
Creatinine, Ser: 6.33 mg/dL — ABNORMAL HIGH (ref 0.44–1.00)
GFR, Estimated: 9 mL/min — ABNORMAL LOW (ref >60)
Glucose, Bld: 314 mg/dL — ABNORMAL HIGH (ref 70–99)
Potassium: 3.9 mmol/L (ref 3.5–5.1)
Sodium: 129 mmol/L — ABNORMAL LOW (ref 135–145)
Total Bilirubin: 0.8 mg/dL (ref 0.3–1.2)
Total Protein: 7.3 g/dL (ref 6.5–8.1)

## 2020-06-10 LAB — CBG MONITORING, ED: Glucose-Capillary: 285 mg/dL — ABNORMAL HIGH (ref 70–99)

## 2020-06-10 IMAGING — DX DG CHEST 2V
2 series · 2 of 2 positions shown · non-contrast
Comparison: Chest x-ray [DATE]

CLINICAL DATA: Chest pain.

EXAM:
CHEST - 2 VIEW

[w chest pa]
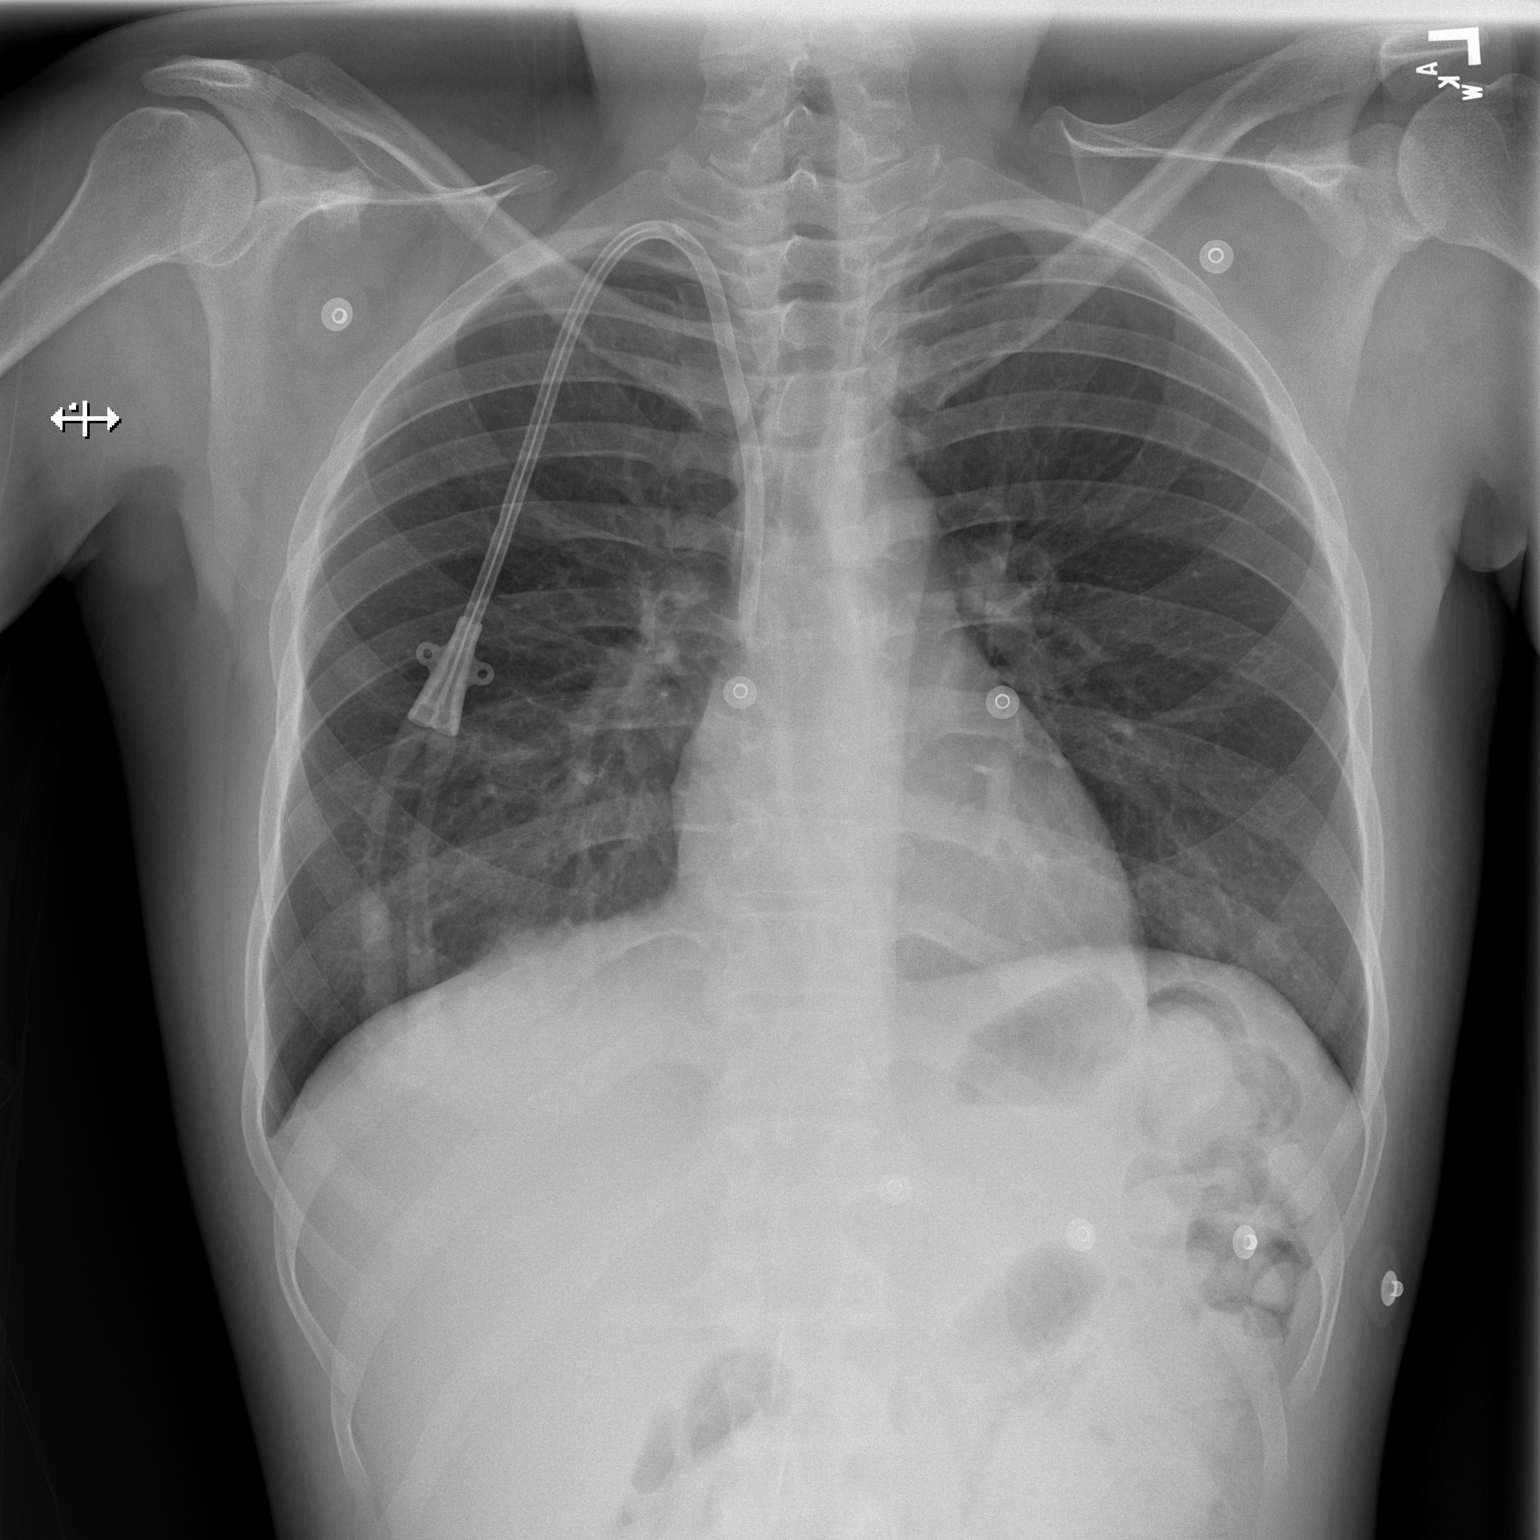

[w chest lat]
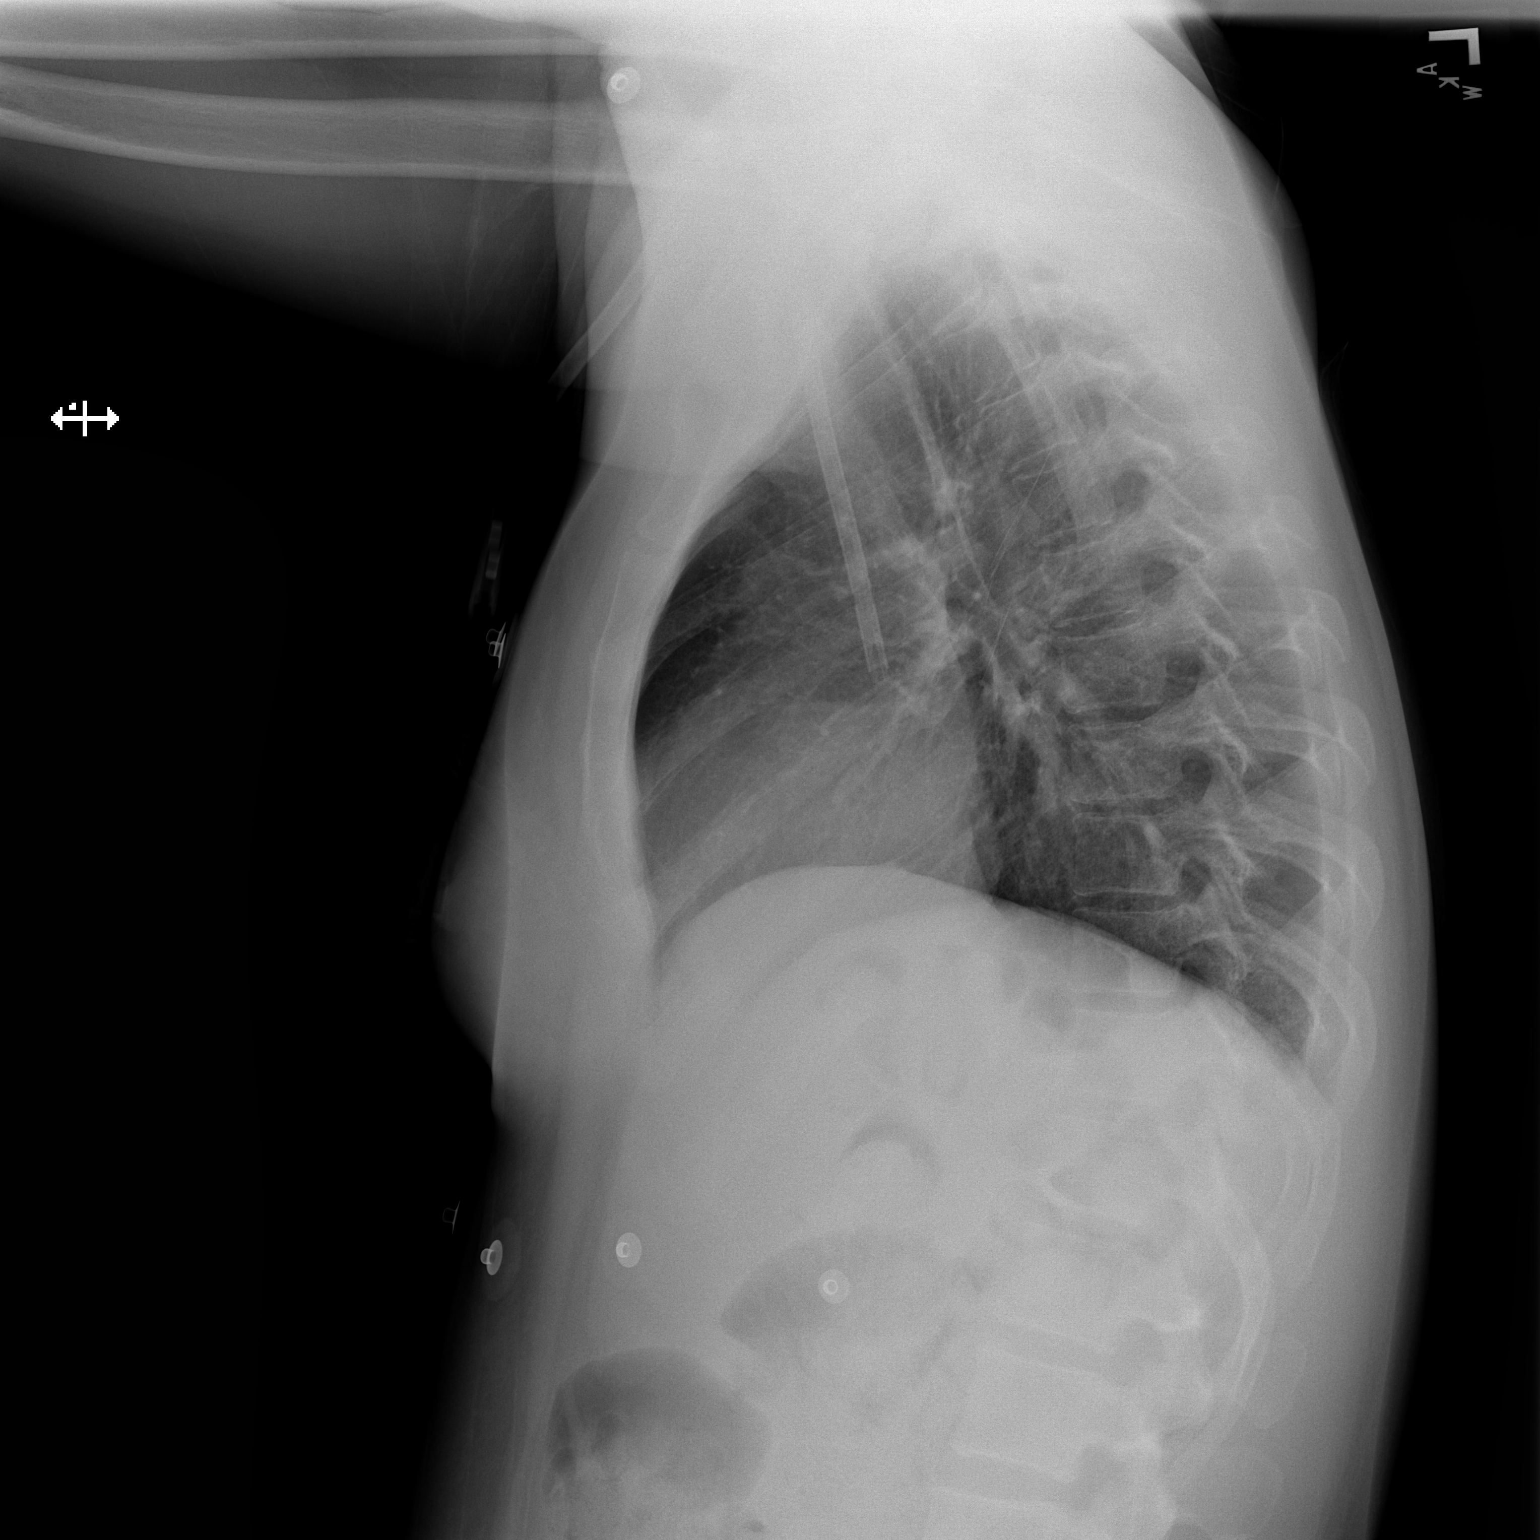

[2 of 2 positions shown; findings below may reference images not displayed]

FINDINGS: Right chest wall dialysis catheter with tip overlying the right
atrium.

The heart size and mediastinal contours are unchanged.

Suggestion of a nipple shadow overlying the left lower lung zone. No
focal consolidation. No pulmonary edema. No pleural effusion. No
pneumothorax.

No acute osseous abnormality.
IMPRESSION: No active cardiopulmonary disease.

## 2020-06-10 MED ORDER — ONDANSETRON 4 MG PO TBDP
8.0000 mg | ORAL_TABLET | Freq: Once | ORAL | Status: AC
  Administered 2020-06-10: 8 mg via ORAL
  Filled 2020-06-10: qty 2

## 2020-06-10 MED ORDER — METOCLOPRAMIDE HCL 5 MG/ML IJ SOLN
10.0000 mg | Freq: Once | INTRAMUSCULAR | Status: AC
  Administered 2020-06-10: 10 mg via INTRAVENOUS
  Filled 2020-06-10: qty 2

## 2020-06-10 MED ORDER — DIPHENHYDRAMINE HCL 50 MG/ML IJ SOLN
25.0000 mg | Freq: Once | INTRAMUSCULAR | Status: AC
  Administered 2020-06-10: 25 mg via INTRAVENOUS
  Filled 2020-06-10: qty 1

## 2020-06-10 NOTE — ED Provider Notes (Signed)
[facility] EMERGENCY DEPARTMENT Provider Note   CSN: [id number] Arrival date & time: 06/10/20  0012     History Chief Complaint  Patient presents with  . Emesis    Barbara Donovan is a 30 y.o. female.  HPI     This is a 30 year old female with a history of diabetes, hypertension, chronic kidney disease now on dialysis who presents with nausea and vomiting.  Patient has a history of gastroparesis.  She was discharged yesterday morning from the hospital after being admitted for DKA.  She states that she has been at home and has had multiple episodes of nonbilious, nonbloody emesis.  She is taking Compazine without relief.  She denies any abdominal discomfort.  She is due for dialysis later today.  Has not had any fevers.  She reports some burning chest discomfort.  Chart reviewed.  Patient was admitted for DKA.  She had worsening renal function and was initiated on hemodialysis.  Ultimate goal is peritoneal dialysis.  Past Medical History:  Diagnosis Date  . Asthma    as a child, no problems as an adult, no inhaler  . Cataract    NS OU  . Chronic hypertension during pregnancy, antepartum 08/19/2017  . Dehydration 01/28/2018  . Depression during pregnancy, antepartum 07/07/2017   6/20: Short trial of zoloft previously, reports didn't help much but also didn't give it a chance Discussed r/b/a SSRIs in pregnancy, agrees to try Zoloft again, rx sent No SI/HI/red flags  . Diabetes ([place])    TYPE I. A1C 7.5% 05/31/20  . Diabetic retinopathy ([place]) 06/09/2017   07/2017 with bilateral severe diabetic non-proliferative retinopathy with macular edema.  Marland Kitchen HTN (hypertension)   . Hypertensive retinopathy    OU  . Hypokalemia 01/22/2018  . Hypomagnesemia 01/28/2018  . Intractable nausea and vomiting 01/22/2018  . Intrauterine growth restriction (IUGR) affecting care of mother 12/22/2017  . Morbid obesity ([place])   . 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 Proteinuria due to type 1  diabetes mellitus ([place]) 11/02/2017   Baseline Pr: Cr 10.23  . Severe hyperemesis gravidarum 10/30/2017  . Type I diabetes mellitus ([place]) 07/07/2017   Current Diabetic Medications:  Insulin  '[x]'$  Aspirin 81 mg daily after 12 weeks (? A2/B GDM)  Required Referrals for A1GDM or A2GDM: '[x]'$  Diabetes Education and Testing Supplies '[x]'$  Nutrition Cousult  For A2/B GDM or higher classes of DM '[x]'$  Diabetes Education and Testing Supplies '[x]'$  Nutrition Counsult '[x]'$  Fetal ECHO after 20 weeks  '[x]'$  Eye exam for retina evaluation - severe retinopathy 7/19  Base  . Ventricular septal defect (VSD) of fetus in singleton pregnancy, antepartum 09/30/2017   May go to newborn nursery per Dr. Lenard Simmer Echo prior to discharge    Patient Active Problem List   Diagnosis Date Noted  . Renal failure (ARF), acute on chronic ([place]) 06/01/2020  . ARF (acute renal failure) ([place])   . Nausea & vomiting   . Diabetic gastroparesis ([place]) 03/04/2020  . Anemia due to stage 5 chronic kidney disease ([place]) 03/04/2020  . Diabetic ketoacidosis without coma associated with type 1 diabetes mellitus ([place]) 02/09/2020  . Hypothermia 01/24/2020  . DKA (diabetic ketoacidosis) ([place]) 09/14/2019  . COVID-19 virus infection 09/14/2019  . SOB (shortness of breath) 03/27/2018  . Hypomagnesemia 01/28/2018  . Dehydration 01/28/2018  . Intractable nausea and vomiting 01/22/2018  . Hypertensive urgency 01/22/2018  . Hypokalemia 01/22/2018  . Nephropathy, diabetic ([place]) 12/29/2017  . Proteinuria due to type 1 diabetes mellitus ([place])  11/02/2017  . Chronic hypertension 08/21/2017  . Uncontrolled type 1 diabetes mellitus with stage 5 chronic kidney disease not on chronic dialysis, with long-term current use of insulin ([place]) 07/07/2017  . Diabetic retinopathy ([place]) 06/09/2017  . Acute depression 11/02/2014  . Asthma 10/30/2013    Past Surgical History:  Procedure Laterality Date  . [place] VITRECTOMY WITH 20 GAUGE MVR PORT FOR MACULAR HOLE  Left 07/20/2018   Procedure: 25 GAUGE PARS PLANA VITRECTOMY LEFT EYE;  Surgeon: Bernarda Caffey, MD;  Location: [facility];  Service: Ophthalmology;  Laterality: Left;  . CESAREAN SECTION N/A 12/26/2017   Procedure: CESAREAN SECTION;  Surgeon: Osborne Oman, MD;  Location: [facility];  Service: Obstetrics;  Laterality: N/A;  . EYE EXAMINATION UNDER ANESTHESIA Right 07/20/2018   Procedure: Eye Exam Under Anesthesia RIGHT EYE;  Surgeon: Bernarda Caffey, MD;  Location: [facility];  Service: Ophthalmology;  Laterality: Right;  . EYE SURGERY Left 07/2018  . GAS INSERTION Left 07/19/2019   Procedure: INSERTION OF GAS;  Surgeon: Bernarda Caffey, MD;  Location: [facility];  Service: Ophthalmology;  Laterality: Left;  SF6  . INJECTION OF SILICONE OIL Left 99991111   Procedure: Injection Of Silicone Oil LEFT EYE;  Surgeon: Bernarda Caffey, MD;  Location: [facility];  Service: Ophthalmology;  Laterality: Left;  . IR FLUORO GUIDE CV LINE RIGHT  06/02/2020  . IR US GUIDE VASC ACCESS RIGHT  06/02/2020  . LASER PHOTO ABLATION Right 07/20/2018   Procedure: Laser Photo Ablation RIGHT EYE;  Surgeon: Bernarda Caffey, MD;  Location: [facility];  Service: Ophthalmology;  Laterality: Right;  . MEMBRANE PEEL Left 07/20/2018   Procedure: Membrane Peel LEFT EYE;  Surgeon: Bernarda Caffey, MD;  Location: [facility];  Service: Ophthalmology;  Laterality: Left;  . MEMBRANE PEEL Left 07/19/2019   Procedure: MEMBRANE PEEL;  Surgeon: Bernarda Caffey, MD;  Location: [facility];  Service: Ophthalmology;  Laterality: Left;  . MITOMYCIN C APPLICATION Bilateral 99991111   Procedure: Avastin Application;  Surgeon: Bernarda Caffey, MD;  Location: [facility];  Service: Ophthalmology;  Laterality: Bilateral;  . PHOTOCOAGULATION WITH LASER Left 07/20/2018   Procedure: Photocoagulation With Laser LEFT EYE;  Surgeon: Bernarda Caffey, MD;  Location: [facility];  Service: Ophthalmology;  Laterality: Left;  . PHOTOCOAGULATION WITH LASER Left 07/19/2019   Procedure: PHOTOCOAGULATION WITH LASER;  Surgeon:  Bernarda Caffey, MD;  Location: [facility];  Service: Ophthalmology;  Laterality: Left;  . RETINAL DETACHMENT SURGERY Left 07/20/2018   Dr. Bernarda Caffey  . SILICON OIL REMOVAL Left 07/19/2019   Procedure: 25g PARS PLANA VITRECTOMY WITH SILICON OIL REMOVAL;  Surgeon: Bernarda Caffey, MD;  Location: [facility];  Service: Ophthalmology;  Laterality: Left;  . WISDOM TOOTH EXTRACTION       OB History    Gravida  1   Para  1   Term      Preterm  1   AB      Living  1     SAB      IAB      Ectopic      Multiple  0   Live Births  1           Family History  Problem Relation Age of Onset  . Diabetes Mother   . Aneurysm Mother 83  . Seizures Mother   . Diabetes Father   . Cataracts Father   . COPD Father   . Heart attack Father   . Heart disease Father   .  Healthy Sister   . Healthy Daughter   . Stroke Maternal Grandfather   . Amblyopia Neg Hx   . Blindness Neg Hx   . Glaucoma Neg Hx   . Macular degeneration Neg Hx   . Retinal detachment Neg Hx   . Strabismus Neg Hx   . Retinitis pigmentosa Neg Hx   . Colon cancer Neg Hx   . Stomach cancer Neg Hx   . Esophageal cancer Neg Hx   . Pancreatic cancer Neg Hx   . Liver disease Neg Hx     Social History   Tobacco Use  . Smoking status: Never Smoker  . Smokeless tobacco: Never Used  Vaping Use  . Vaping Use: Never used  Substance Use Topics  . Alcohol use: Not Currently    Comment: SOCIALLY  . Drug use: Never    Home Medications Prior to Admission medications   Medication Sig Start Date End Date Taking? Authorizing Provider  amLODipine (NORVASC) 10 MG tablet Take 1 tablet (10 mg total) by mouth daily. 03/09/20   Mercy Riding, MD  carvedilol (COREG) 25 MG tablet Take 1 tablet (25 mg total) by mouth 2 (two) times daily. 03/07/19   Imogene Burn, PA-C  Continuous Blood Gluc Sensor MISC 1 each by Does not apply route as directed. Use as directed every 14 days. May dispense FreeStyle [employer] or similar. 01/27/20    Antonieta Pert, MD  furosemide (LASIX) 80 MG tablet Take 1 tablet (80 mg total) by mouth daily. 06/09/20   Geradine Girt, DO  hydrALAZINE (APRESOLINE) 50 MG tablet Take 2 tablets (100 mg total) by mouth 3 (three) times daily. 06/09/20   Geradine Girt, DO  insulin glargine (LANTUS SOLOSTAR) 100 UNIT/ML Solostar Pen Inject 10 Units into the skin every morning. 06/09/20   Geradine Girt, DO  insulin lispro (HUMALOG KWIKPEN) 100 UNIT/ML KwikPen Inject 2 Units into the skin 3 (three) times daily with meals. 04/25/20   Renato Shin, MD  latanoprost (XALATAN) 0.005 % ophthalmic solution Place 1 drop into the left eye at bedtime. 06/09/20   Geradine Girt, DO  losartan (COZAAR) 50 MG tablet Take 2 tablets (100 mg total) by mouth daily. 06/09/20   Geradine Girt, DO  multivitamin (RENA-VIT) TABS tablet Take 1 tablet by mouth at bedtime. 03/08/20   Mercy Riding, MD  pantoprazole (PROTONIX) 40 MG tablet Take 1 tablet (40 mg total) by mouth daily. 01/31/20   Ladene Artist, MD  prochlorperazine (COMPAZINE) 10 MG tablet Take 10 mg by mouth every 6 (six) hours as needed for nausea or vomiting. 05/03/20   [provider]  sertraline (ZOLOFT) 100 MG tablet Take 100 mg by mouth every morning. 02/15/20   [provider]  traZODone (DESYREL) 50 MG tablet Take 50 mg by mouth at bedtime. 02/16/20   [provider]    Allergies    Patient has no known allergies.  Review of Systems   Review of Systems  Constitutional: Negative for fever.  Respiratory: Negative for shortness of breath.   Cardiovascular: Positive for chest pain.  Gastrointestinal: Positive for nausea and vomiting. Negative for abdominal pain and diarrhea.  Genitourinary: Negative for dysuria.  All other systems reviewed and are negative.   Physical Exam Updated Vital Signs BP (!) 195/94   Pulse (!) 103   Temp 98 F (36.7 C) (Oral)   Resp 20   SpO2 96%   Physical Exam Vitals and nursing note  reviewed.   Constitutional:      Appearance: She is well-developed. She is ill-appearing. She is not toxic-appearing.  HENT:     Head: Normocephalic and atraumatic.     Nose: Nose normal.     Mouth/Throat:     Mouth: Mucous membranes are dry.  Eyes:     Pupils: Pupils are equal, round, and reactive to light.  Cardiovascular:     Rate and Rhythm: Normal rate and regular rhythm.     Heart sounds: Normal heart sounds.     Comments: Dialysis catheter right upper chest Pulmonary:     Effort: Pulmonary effort is normal. No respiratory distress.     Breath sounds: No wheezing.  Abdominal:     General: Bowel sounds are normal.     Palpations: Abdomen is soft.     Tenderness: There is no abdominal tenderness. There is no guarding or rebound.  Musculoskeletal:     Cervical back: Neck supple.     Right lower leg: No edema.     Left lower leg: No edema.  Skin:    General: Skin is warm and dry.  Neurological:     Mental Status: She is alert and oriented to person, place, and time.  Psychiatric:        Mood and Affect: Mood normal.     ED Results / Procedures / Treatments   Labs (all labs ordered are listed, but only abnormal results are displayed) Labs Reviewed  COMPREHENSIVE METABOLIC PANEL - Abnormal; Notable for the following components:      Result Value   Sodium 129 (*)    Chloride 93 (*)    CO2 19 (*)    Glucose, Bld 314 (*)    BUN 38 (*)    Creatinine, Ser 6.33 (*)    Albumin 3.3 (*)    GFR, Estimated 9 (*)    Anion gap 17 (*)    All other components within normal limits  CBC WITH DIFFERENTIAL/PLATELET - Abnormal; Notable for the following components:   WBC 11.1 (*)    Hemoglobin 11.7 (*)    HCT 35.5 (*)    MCV 79.6 (*)    Platelets 503 (*)    Neutro Abs 8.3 (*)    Abs Immature Granulocytes 0.13 (*)    All other components within normal limits  CBG MONITORING, ED - Abnormal; Notable for the following components:   Glucose-Capillary 285 (*)    All other components within  normal limits  I-STAT VENOUS BLOOD GAS, ED - Abnormal; Notable for the following components:   pH, Ven 7.540 (*)    pCO2, Ven 23.7 (*)    pO2, Ven 23.0 (*)    TCO2 21 (*)    Sodium 129 (*)    Calcium, Ion 1.09 (*)    All other components within normal limits  TROPONIN I (HIGH SENSITIVITY) - Abnormal; Notable for the following components:   Troponin I (High Sensitivity) 18 (*)    All other components within normal limits  TROPONIN I (HIGH SENSITIVITY) - Abnormal; Notable for the following components:   Troponin I (High Sensitivity) 25 (*)    All other components within normal limits  URINALYSIS, ROUTINE W REFLEX MICROSCOPIC  I-STAT BETA HCG BLOOD, ED ([facility], [facility], [facility] ONLY)    EKG EKG Interpretation  Date/Time:  Tuesday Jun 10 2020 00:21:07 EDT Ventricular Rate:  103 PR Interval:  172 QRS Duration: 94 QT Interval:  382 QTC Calculation: 500 R Axis:   70 Text  Interpretation: Sinus tachycardia Otherwise normal ECG When compared with ECG of 05/31/2020, No significant change was found Confirmed by Delora Fuel ([id number]) on 06/10/2020 2:12:37 AM   Radiology DG Chest 2 View  Result Date: 06/10/2020 CLINICAL DATA:  Chest pain. EXAM: CHEST - 2 VIEW COMPARISON:  Chest x-ray 06/01/2020 FINDINGS: Right chest wall dialysis catheter with tip overlying the right atrium. The heart size and mediastinal contours are unchanged. Suggestion of a nipple shadow overlying the left lower lung zone. No focal consolidation. No pulmonary edema. No pleural effusion. No pneumothorax. No acute osseous abnormality. IMPRESSION: No active cardiopulmonary disease. Electronically Signed   By: Iven Finn M.D.   On: 06/10/2020 01:28    Procedures Procedures   Medications Ordered in ED Medications  ondansetron (ZOFRAN-ODT) disintegrating tablet 8 mg (8 mg Oral Given 06/10/20 0030)  metoCLOPramide (REGLAN) injection 10 mg (10 mg Intravenous Given 06/10/20 0454)  diphenhydrAMINE (BENADRYL) injection 25 mg (25 mg  Intravenous Given 06/10/20 0455)    ED Course  I have reviewed the triage vital signs and the nursing notes.  Pertinent labs & imaging results that were available during my care of the patient were reviewed by me and considered in my medical decision making (see chart for details).    MDM Rules/Calculators/A&P                          Patient presents with nausea and vomiting.  Was discharged yesterday after being admitted for DKA.  Has also a history of gastroparesis and reports some chest pain.  She is due for dialysis later today.  She is nontoxic and vital signs notable for hypertension and tachycardia.  EKG shows no acute ischemic changes initial troponin is 18 with a repeat of 25.  This was within range of her prior troponins.  Doubt ACS.  Metabolic panel slightly worsened from discharged yesterday.  She is much more hypochloremic with a chloride of 93.  Glucose is 314.  She does have an anion gap of 17 which is slightly elevated.  Feel this is likely more reflective of hypochloremia/uremia and vomiting than true DKA as her pH is preserved on a VBG with a pH of 7.54.  Patient tells me she no longer produces urine.  She is due for dialysis today.  We will hold off on aggressive fluid resuscitation.  She was given Reglan and Benadryl.  She is able to hydrate herself without recurrent emesis. feel that dialysis today will help correct her metabolic derangements.  After history, exam, and medical workup I feel the patient has been appropriately medically screened and is safe for discharge home. Pertinent diagnoses were discussed with the patient. Patient was given return precautions.  Final Clinical Impression(s) / ED Diagnoses Final diagnoses:  Gastroparesis    Rx / DC Orders ED Discharge Orders    None       Clarisa Danser, Barbette Hair, MD 06/10/20 [phone]

## 2020-06-10 NOTE — ED Provider Notes (Signed)
Emergency Medicine Provider Triage Evaluation Note  Barbara Donovan , a 30 y.o. female  was evaluated in triage.  Pt complains of nausea, vomiting and chest pain. Was discharged from the hospital today after admission for DKA, nausea, vomiting, and reports she was started on dialysis during this admission.  Got home today and started vomiting again and has not been able to keep anything down. Tried taking Compazine but was unable to keep it down to see if it would help.  Reports started having some central chest pain after vomiting.  No hematemesis, no abdominal pain.  Review of Systems  Positive: Nausea, vomiting, chest pain Negative: Fever, abdominal pain  Physical Exam  BP (!) 162/96 (BP Location: Left Arm)   Pulse (!) 102   Temp 98.3 F (36.8 C)   Resp 20   SpO2 100%  Gen:   Awake, ill-appearing, actively vomiting Resp:  Normal effort  MSK:   Moves extremities without difficulty  Other:    Medical Decision Making  Medically screening exam initiated at 12:24 AM.  Appropriate orders placed.  Barbara Donovan was informed that the remainder of the evaluation will be completed by another provider, this initial triage assessment does not replace that evaluation, and the importance of remaining in the ED until their evaluation is complete.     Jacqlyn Larsen, PA-C 06/10/20 0031    Merryl Hacker, MD 06/10/20 [phone]

## 2020-06-10 NOTE — ED Notes (Signed)
Pt given water. Not complaining of nausea or vomiting at this time.

## 2020-06-10 NOTE — ED Triage Notes (Signed)
Pt reports that she was d/c from the hospital today, dialysis pt and type one diabetic, since going home has been having n/v, pt also having CP

## 2020-06-10 NOTE — Discharge Instructions (Addendum)
You are seen today for nausea and vomiting.  You are not in DKA.  It is important that you follow-up for dialysis as scheduled today.  Take your medications as directed.

## 2020-07-28 ENCOUNTER — Other Ambulatory Visit: Payer: Self-pay

## 2020-07-28 ENCOUNTER — Emergency Department (HOSPITAL_COMMUNITY)
Admission: EM | Admit: 2020-07-28 | Discharge: 2020-07-28 | Disposition: A | Discharge status: Home / Self Care | Payer: 59 | Attending: Emergency Medicine | Admitting: Emergency Medicine

## 2020-07-28 ENCOUNTER — Encounter (HOSPITAL_COMMUNITY): Payer: Self-pay

## 2020-07-28 DIAGNOSIS — Z20822 Contact with and (suspected) exposure to covid-19: Secondary | ICD-10-CM | POA: Insufficient documentation

## 2020-07-28 DIAGNOSIS — J45909 Unspecified asthma, uncomplicated: Secondary | ICD-10-CM | POA: Diagnosis not present

## 2020-07-28 DIAGNOSIS — N186 End stage renal disease: Secondary | ICD-10-CM | POA: Insufficient documentation

## 2020-07-28 DIAGNOSIS — I120 Hypertensive chronic kidney disease with stage 5 chronic kidney disease or end stage renal disease: Secondary | ICD-10-CM | POA: Diagnosis not present

## 2020-07-28 DIAGNOSIS — R112 Nausea with vomiting, unspecified: Secondary | ICD-10-CM | POA: Diagnosis present

## 2020-07-28 DIAGNOSIS — E1022 Type 1 diabetes mellitus with diabetic chronic kidney disease: Secondary | ICD-10-CM | POA: Diagnosis not present

## 2020-07-28 DIAGNOSIS — Z992 Dependence on renal dialysis: Secondary | ICD-10-CM | POA: Diagnosis not present

## 2020-07-28 DIAGNOSIS — Z79899 Other long term (current) drug therapy: Secondary | ICD-10-CM | POA: Insufficient documentation

## 2020-07-28 DIAGNOSIS — R000 Tachycardia, unspecified: Secondary | ICD-10-CM | POA: Diagnosis not present

## 2020-07-28 DIAGNOSIS — Z794 Long term (current) use of insulin: Secondary | ICD-10-CM | POA: Diagnosis not present

## 2020-07-28 DIAGNOSIS — Z8616 Personal history of COVID-19: Secondary | ICD-10-CM | POA: Diagnosis not present

## 2020-07-28 LAB — COMPREHENSIVE METABOLIC PANEL
ALT: 12 U/L (ref 0–44)
AST: 16 U/L (ref 15–41)
Albumin: 3.5 g/dL (ref 3.5–5.0)
Alkaline Phosphatase: 57 U/L (ref 38–126)
Anion gap: 14 (ref 5–15)
BUN: 16 mg/dL (ref 6–20)
CO2: 27 mmol/L (ref 22–32)
Calcium: 8.9 mg/dL (ref 8.9–10.3)
Chloride: 96 mmol/L — ABNORMAL LOW (ref 98–111)
Creatinine, Ser: 3.68 mg/dL — ABNORMAL HIGH (ref 0.44–1.00)
GFR, Estimated: 16 mL/min — ABNORMAL LOW (ref >60)
Glucose, Bld: 207 mg/dL — ABNORMAL HIGH (ref 70–99)
Potassium: 3.2 mmol/L — ABNORMAL LOW (ref 3.5–5.1)
Sodium: 137 mmol/L (ref 135–145)
Total Bilirubin: 1 mg/dL (ref 0.3–1.2)
Total Protein: 7.6 g/dL (ref 6.5–8.1)

## 2020-07-28 LAB — URINALYSIS, ROUTINE W REFLEX MICROSCOPIC
Bilirubin Urine: NEGATIVE
Glucose, UA: >=500 mg/dL — AB
Hgb urine dipstick: NEGATIVE
Ketones, ur: 80 mg/dL — AB
Leukocytes,Ua: NEGATIVE
Nitrite: NEGATIVE
Protein, ur: >=300 mg/dL — AB
Specific Gravity, Urine: 1.015 (ref 1.005–1.030)
pH: 7 (ref 5.0–8.0)

## 2020-07-28 LAB — CBC WITH DIFFERENTIAL/PLATELET
Abs Immature Granulocytes: 0.04 10*3/uL (ref 0.00–0.07)
Basophils Absolute: 0.1 10*3/uL (ref 0.0–0.1)
Basophils Relative: 1 %
Eosinophils Absolute: 0 10*3/uL (ref 0.0–0.5)
Eosinophils Relative: 0 %
HCT: 39.5 % (ref 36.0–46.0)
Hemoglobin: 12.3 g/dL (ref 12.0–15.0)
Immature Granulocytes: 1 %
Lymphocytes Relative: 15 %
Lymphs Abs: 1.3 10*3/uL (ref 0.7–4.0)
MCH: 26.4 pg (ref 26.0–34.0)
MCHC: 31.1 g/dL (ref 30.0–36.0)
MCV: 84.8 fL (ref 80.0–100.0)
Monocytes Absolute: 0.4 10*3/uL (ref 0.1–1.0)
Monocytes Relative: 4 %
Neutro Abs: 7 10*3/uL (ref 1.7–7.7)
Neutrophils Relative %: 79 %
Platelets: 384 10*3/uL (ref 150–400)
RBC: 4.66 MIL/uL (ref 3.87–5.11)
RDW: 17.2 % — ABNORMAL HIGH (ref 11.5–15.5)
WBC: 8.8 10*3/uL (ref 4.0–10.5)
nRBC: 0.3 % — ABNORMAL HIGH (ref 0.0–0.2)

## 2020-07-28 LAB — I-STAT BETA HCG BLOOD, ED (MC, WL, AP ONLY): I-stat hCG, quantitative: <5 m[IU]/mL (ref <5)

## 2020-07-28 LAB — BLOOD GAS, VENOUS
Acid-Base Excess: 2.7 mmol/L — ABNORMAL HIGH (ref 0.0–2.0)
Bicarbonate: 26.9 mmol/L (ref 20.0–28.0)
O2 Saturation: 76.6 %
Patient temperature: 98.6
pCO2, Ven: 42.1 mmHg — ABNORMAL LOW (ref 44.0–60.0)
pH, Ven: 7.422 (ref 7.250–7.430)
pO2, Ven: 44.1 mmHg (ref 32.0–45.0)

## 2020-07-28 LAB — LIPASE, BLOOD: Lipase: 31 U/L (ref 11–51)

## 2020-07-28 LAB — CBG MONITORING, ED: Glucose-Capillary: 191 mg/dL — ABNORMAL HIGH (ref 70–99)

## 2020-07-28 LAB — RESP PANEL BY RT-PCR (FLU A&B, COVID) ARPGX2
Influenza A by PCR: NEGATIVE
Influenza B by PCR: NEGATIVE
SARS Coronavirus 2 by RT PCR: NEGATIVE

## 2020-07-28 MED ORDER — SODIUM CHLORIDE 0.9 % IV BOLUS
500.0000 mL | Freq: Once | INTRAVENOUS | Status: AC
  Administered 2020-07-28: 500 mL via INTRAVENOUS

## 2020-07-28 MED ORDER — ONDANSETRON HCL 4 MG/2ML IJ SOLN
4.0000 mg | Freq: Once | INTRAMUSCULAR | Status: AC
  Administered 2020-07-28: 4 mg via INTRAVENOUS
  Filled 2020-07-28: qty 2

## 2020-07-28 NOTE — ED Provider Notes (Signed)
[facility] DEPT Provider Note   CSN: [id number] Arrival date & time: 07/28/20  1326     History Chief Complaint  Patient presents with   Nausea   Emesis   Hypertension    Barbara Donovan is a 30 y.o. female.  HPI   Pt is a 30 y/o female with a h/o asthma, type I DM, diabetic retinopathy, ESRD on dialysis, VSD, who presents to the ED today for eval of NV that started yesterday. Denies diarrhea, constipation, abd pain, dysuria, frequency, or urgency.  Denies sweats, chills, cough, rhinorrhea, chest pain, sob. She was unable to take her insulin and was not able to keep down her bp meds yesterday due to her vomiting. She still urinates a few times per day.  Dialysis (M, T, Th, Fri). Pt went to dialysis today and finished her entire session. Has not missed any dialysis recently.   Past Medical History:  Diagnosis Date   Asthma    as a child, no problems as an adult, no inhaler   Cataract    NS OU   Chronic hypertension during pregnancy, antepartum 08/19/2017   Dehydration 01/28/2018   Depression during pregnancy, antepartum 07/07/2017   6/20: Short trial of zoloft previously, reports didn't help much but also didn't give it a chance Discussed r/b/a SSRIs in pregnancy, agrees to try Zoloft again, rx sent No SI/HI/red flags   Diabetes ([place])    TYPE I. A1C 7.5% 05/31/20   Diabetic retinopathy ([place]) 06/09/2017   07/2017 with bilateral severe diabetic non-proliferative retinopathy with macular edema.   HTN (hypertension)    Hypertensive retinopathy    OU   Hypokalemia 01/22/2018   Hypomagnesemia 01/28/2018   Intractable nausea and vomiting 01/22/2018   Intrauterine growth restriction (IUGR) affecting care of mother 12/22/2017   Morbid obesity ([place])    Nephropathy, diabetic ([place]) 12/29/2017   Proteinuria due to type 1 diabetes mellitus ([place]) 11/02/2017   Baseline Pr: Cr 10.23   Severe hyperemesis gravidarum 10/30/2017   Type I diabetes mellitus ([place])  07/07/2017   Current Diabetic Medications:  Insulin  '[x]'$  Aspirin 81 mg daily after 12 weeks (? A2/B GDM)  Required Referrals for A1GDM or A2GDM: '[x]'$  Diabetes Education and Testing Supplies '[x]'$  Nutrition Cousult  For A2/B GDM or higher classes of DM '[x]'$  Diabetes Education and Testing Supplies '[x]'$  Nutrition Counsult '[x]'$  Fetal ECHO after 20 weeks  '[x]'$  Eye exam for retina evaluation - severe retinopathy 7/19  Base   Ventricular septal defect (VSD) of fetus in singleton pregnancy, antepartum 09/30/2017   May go to newborn nursery per Dr. Lenard Simmer Echo prior to discharge    Patient Active Problem List   Diagnosis Date Noted   Renal failure (ARF), acute on chronic ([place]) 06/01/2020   ARF (acute renal failure) (H[facility])    Nausea & vomiting    Diabetic gastroparesis ([place]) 03/04/2020   Anemia due to stage 5 chronic kidney disease ([place]) 03/04/2020   Diabetic ketoacidosis without coma associated with type 1 diabetes mellitus ([place]) 02/09/2020   Hypothermia 01/24/2020   DKA (diabetic ketoacidosis) ([place]) 09/14/2019   COVID-19 virus infection 09/14/2019   SOB (shortness of breath) 03/27/2018   Hypomagnesemia 01/28/2018   Dehydration 01/28/2018   Intractable nausea and vomiting 01/22/2018   Hypertensive urgency 01/22/2018   Hypokalemia 01/22/2018   Nephropathy, diabetic ([place]) 12/29/2017   Proteinuria due to type 1 diabetes mellitus ([place]) 11/02/2017   Chronic hypertension 08/21/2017   Uncontrolled type 1 diabetes mellitus with stage 5  chronic kidney disease not on chronic dialysis, with long-term current use of insulin ([place]) 07/07/2017   Diabetic retinopathy ([place]) 06/09/2017   Acute depression 11/02/2014   Asthma 10/30/2013    Past Surgical History:  Procedure Laterality Date   25 GAUGE PARS PLANA VITRECTOMY WITH 20 GAUGE MVR PORT FOR MACULAR HOLE Left 07/20/2018   Procedure: 25 GAUGE PARS PLANA VITRECTOMY LEFT EYE;  Surgeon: Bernarda Caffey, MD;  Location: [facility];  Service: Ophthalmology;  Laterality: Left;    CESAREAN SECTION N/A 12/26/2017   Procedure: CESAREAN SECTION;  Surgeon: Osborne Oman, MD;  Location: [facility];  Service: Obstetrics;  Laterality: N/A;   EYE EXAMINATION UNDER ANESTHESIA Right 07/20/2018   Procedure: Eye Exam Under Anesthesia RIGHT EYE;  Surgeon: Bernarda Caffey, MD;  Location: [facility];  Service: Ophthalmology;  Laterality: Right;   EYE SURGERY Left 07/2018   GAS INSERTION Left 07/19/2019   Procedure: INSERTION OF GAS;  Surgeon: Bernarda Caffey, MD;  Location: [facility];  Service: Ophthalmology;  Laterality: Left;  SF6   INJECTION OF SILICONE OIL Left 99991111   Procedure: Injection Of Silicone Oil LEFT EYE;  Surgeon: Bernarda Caffey, MD;  Location: [facility];  Service: Ophthalmology;  Laterality: Left;   IR FLUORO GUIDE CV LINE RIGHT  06/02/2020   IR US GUIDE VASC ACCESS RIGHT  06/02/2020   LASER PHOTO ABLATION Right 07/20/2018   Procedure: Laser Photo Ablation RIGHT EYE;  Surgeon: Bernarda Caffey, MD;  Location: [facility];  Service: Ophthalmology;  Laterality: Right;   MEMBRANE PEEL Left 07/20/2018   Procedure: Membrane Peel LEFT EYE;  Surgeon: Bernarda Caffey, MD;  Location: [facility];  Service: Ophthalmology;  Laterality: Left;   MEMBRANE PEEL Left 07/19/2019   Procedure: MEMBRANE PEEL;  Surgeon: Bernarda Caffey, MD;  Location: [facility];  Service: Ophthalmology;  Laterality: Left;   MITOMYCIN C APPLICATION Bilateral 99991111   Procedure: Avastin Application;  Surgeon: Bernarda Caffey, MD;  Location: [facility];  Service: Ophthalmology;  Laterality: Bilateral;   PHOTOCOAGULATION WITH LASER Left 07/20/2018   Procedure: Photocoagulation With Laser LEFT EYE;  Surgeon: Bernarda Caffey, MD;  Location: [facility];  Service: Ophthalmology;  Laterality: Left;   PHOTOCOAGULATION WITH LASER Left 07/19/2019   Procedure: PHOTOCOAGULATION WITH LASER;  Surgeon: Bernarda Caffey, MD;  Location: [facility];  Service: Ophthalmology;  Laterality: Left;   RETINAL DETACHMENT SURGERY Left 07/20/2018   Dr. Bernarda Caffey   SILICON OIL REMOVAL Left  07/19/2019   Procedure: 25g PARS PLANA VITRECTOMY WITH SILICON OIL REMOVAL;  Surgeon: Bernarda Caffey, MD;  Location: [facility];  Service: Ophthalmology;  Laterality: Left;   WISDOM TOOTH EXTRACTION       OB History     Gravida  1   Para  1   Term      Preterm  1   AB      Living  1      SAB      IAB      Ectopic      Multiple  0   Live Births  1           Family History  Problem Relation Age of Onset   Diabetes Mother    Aneurysm Mother 34   Seizures Mother    Diabetes Father    Cataracts Father    COPD Father    Heart attack Father    Heart disease Father    Healthy Sister    Healthy Daughter    Stroke Maternal Grandfather  Amblyopia Neg Hx    Blindness Neg Hx    Glaucoma Neg Hx    Macular degeneration Neg Hx    Retinal detachment Neg Hx    Strabismus Neg Hx    Retinitis pigmentosa Neg Hx    Colon cancer Neg Hx    Stomach cancer Neg Hx    Esophageal cancer Neg Hx    Pancreatic cancer Neg Hx    Liver disease Neg Hx     Social History   Tobacco Use   Smoking status: Never   Smokeless tobacco: Never  Vaping Use   Vaping Use: Never used  Substance Use Topics   Alcohol use: Not Currently    Comment: SOCIALLY   Drug use: Never    Home Medications Prior to Admission medications   Medication Sig Start Date End Date Taking? Authorizing Provider  amLODipine (NORVASC) 10 MG tablet Take 1 tablet (10 mg total) by mouth daily. 03/09/20  Yes Mercy Riding, MD  carvedilol (COREG) 25 MG tablet Take 1 tablet (25 mg total) by mouth 2 (two) times daily. 03/07/19  Yes Imogene Burn, PA-C  hydrALAZINE (APRESOLINE) 50 MG tablet Take 2 tablets (100 mg total) by mouth 3 (three) times daily. 06/09/20  Yes Vann, Oaklie U, DO  insulin glargine (LANTUS SOLOSTAR) 100 UNIT/ML Solostar Pen Inject 10 Units into the skin every morning. Patient taking differently: Inject 16 Units into the skin every morning. 06/09/20  Yes Vann, Lazette U, DO  insulin lispro (HUMALOG  KWIKPEN) 100 UNIT/ML KwikPen Inject 2 Units into the skin 3 (three) times daily with meals. 04/25/20  Yes Renato Shin, MD  metoCLOPramide (REGLAN) 5 MG tablet Take 5 mg by mouth daily as needed for nausea or vomiting. 07/01/20  Yes [provider]  multivitamin (RENA-VIT) TABS tablet Take 1 tablet by mouth at bedtime. 03/08/20  Yes Mercy Riding, MD  pantoprazole (PROTONIX) 40 MG tablet Take 1 tablet (40 mg total) by mouth daily. 01/31/20  Yes Ladene Artist, MD  Continuous Blood Gluc Sensor MISC 1 each by Does not apply route as directed. Use as directed every 14 days. May dispense FreeStyle [employer] or similar. 01/27/20   Antonieta Pert, MD  furosemide (LASIX) 80 MG tablet Take 1 tablet (80 mg total) by mouth daily. Patient not taking: No sig reported 06/09/20   Geradine Girt, DO  latanoprost (XALATAN) 0.005 % ophthalmic solution Place 1 drop into the left eye at bedtime. Patient not taking: No sig reported 06/09/20   Geradine Girt, DO  losartan (COZAAR) 50 MG tablet Take 2 tablets (100 mg total) by mouth daily. Patient not taking: No sig reported 06/09/20   Geradine Girt, DO    Allergies    Patient has no known allergies.  Review of Systems   Review of Systems  Constitutional:  Negative for fever.  HENT:  Negative for ear pain and sore throat.   Eyes:  Negative for visual disturbance.  Respiratory:  Negative for cough and shortness of breath.   Cardiovascular:  Negative for chest pain.  Gastrointestinal:  Positive for nausea and vomiting. Negative for abdominal pain, constipation and diarrhea.  Genitourinary:  Negative for dysuria and hematuria.  Musculoskeletal:  Negative for back pain.  Skin:  Negative for rash.  Neurological:  Negative for seizures and syncope.  All other systems reviewed and are negative.  Physical Exam Updated Vital Signs BP (!) 182/108 Comment: Pt states BP normally high  Pulse 97  Temp 98.1 F (36.7 C)   Resp 17   SpO2 100%   Physical  Exam Vitals and nursing note reviewed.  Constitutional:      General: She is not in acute distress.    Appearance: She is well-developed. She is ill-appearing.  HENT:     Head: Normocephalic and atraumatic.     Mouth/Throat:     Mouth: Mucous membranes are dry.  Eyes:     Conjunctiva/sclera: Conjunctivae normal.  Cardiovascular:     Rate and Rhythm: Regular rhythm. Tachycardia present.     Heart sounds: No murmur heard. Pulmonary:     Effort: Pulmonary effort is normal. No respiratory distress.     Breath sounds: Normal breath sounds. No wheezing, rhonchi or rales.  Abdominal:     General: Bowel sounds are normal.     Palpations: Abdomen is soft.     Tenderness: There is no abdominal tenderness. There is no guarding or rebound.  Musculoskeletal:     Cervical back: Neck supple.  Skin:    General: Skin is warm and dry.  Neurological:     Mental Status: She is alert.    ED Results / Procedures / Treatments   Labs (all labs ordered are listed, but only abnormal results are displayed) Labs Reviewed  CBC WITH DIFFERENTIAL/PLATELET - Abnormal; Notable for the following components:      Result Value   RDW 17.2 (*)    nRBC 0.3 (*)    All other components within normal limits  COMPREHENSIVE METABOLIC PANEL - Abnormal; Notable for the following components:   Potassium 3.2 (*)    Chloride 96 (*)    Glucose, Bld 207 (*)    Creatinine, Ser 3.68 (*)    GFR, Estimated 16 (*)    All other components within normal limits  URINALYSIS, ROUTINE W REFLEX MICROSCOPIC - Abnormal; Notable for the following components:   Glucose, UA >=500 (*)    Ketones, ur 80 (*)    Protein, ur >=300 (*)    Bacteria, UA RARE (*)    All other components within normal limits  BLOOD GAS, VENOUS - Abnormal; Notable for the following components:   pCO2, Ven 42.1 (*)    Acid-Base Excess 2.7 (*)    All other components within normal limits  CBG MONITORING, ED - Abnormal; Notable for the following components:    Glucose-Capillary 191 (*)    All other components within normal limits  RESP PANEL BY RT-PCR (FLU A&B, COVID) ARPGX2  LIPASE, BLOOD  I-STAT BETA HCG BLOOD, ED ([facility], [facility], [facility] ONLY)    EKG None  Radiology No results found.  Procedures Procedures   7:47 PM Cardiac monitoring reveals sinus tachy, HR 110s (Rate & rhythm), as reviewed and interpreted by me. Cardiac monitoring was ordered due to tachycardia and to monitor patient for dysrhythmia.    Medications Ordered in ED Medications  sodium chloride 0.9 % bolus 500 mL (0 mLs Intravenous Stopped 07/28/20 1803)  ondansetron (ZOFRAN) injection 4 mg (4 mg Intravenous Given 07/28/20 1635)    ED Course  I have reviewed the triage vital signs and the nursing notes.  Pertinent labs & imaging results that were available during my care of the patient were reviewed by me and considered in my medical decision making (see chart for details).    MDM Rules/Calculators/A&P                          30 y/o  F presenting for eval of nv that started yesterday  Reviewed/interpreted labs CBG elevated at 191 CBC unremarkable CMP with elevated blood glucose but normal bicarb and no elevated anion gap, mild hypokalemia but otherwise electrolytes are within normal limits Lipase negative Beta hcg negative VBG with no acidemia UA with some ketonuria, proteinuria and glucosuria but no evidence of UTI  EKG - with sinus tach, otherwise reassuring  Patient received a small IV fluid bolus and Zofran.  On reassessment she states she is feeling improved.  She is had no episodes of vomiting while in the ED.  She continues to deny any abdominal pain and her abdominal exam is benign.  I doubt emergent process that would require further imaging or investigation at this time.  She has no evidence of DKA on her labs.  This seems to be a recurrent problem for the patient and she follows with GI for this.  Have advised that she follow-up closely as an outpatient  return to the ED for any new or worsening symptoms.  She voiced understanding the plan and reasons to return.  All questions answered.  Patient stable for discharge.  Final Clinical Impression(s) / ED Diagnoses Final diagnoses:  Non-intractable vomiting with nausea, unspecified vomiting type    Rx / DC Orders ED Discharge Orders     None        Bishop Dublin 07/28/20 1947    Lacretia Leigh, MD 07/29/20 1014

## 2020-07-28 NOTE — ED Triage Notes (Signed)
EMS reports from Dialysis clinic, N/V since yesterday, at clinic today had recurring bout of Nausea and hypertension.  BP 174/102 HR 120 RR 18 Sp02 100 RA CBG 181

## 2020-07-28 NOTE — Discharge Instructions (Addendum)
Take your reglan and phenergan at home for nausea and vomiting   Please follow up with your primary doctor within the next 5-7 days.  If you do not have a primary care provider, information for a healthcare clinic has been provided for you to make arrangements for follow up care. Please return to the ER sooner if you have any new or worsening symptoms, or if you have any of the following symptoms:  Abdominal pain that does not go away.  You have a fever.  You keep throwing up (vomiting).  The pain is felt only in portions of the abdomen. Pain in the right side could possibly be appendicitis. In an adult, pain in the left lower portion of the abdomen could be colitis or diverticulitis.  You pass bloody or black tarry stools.  There is bright red blood in the stool.  The constipation stays for more than 4 days.  There is belly (abdominal) or rectal pain.  You do not seem to be getting better.  You have any questions or concerns.

## 2020-08-02 ENCOUNTER — Emergency Department (HOSPITAL_COMMUNITY)
Admission: EM | Admit: 2020-08-02 | Discharge: 2020-08-02 | Disposition: A | Discharge status: Home / Self Care | Payer: 59 | Attending: Emergency Medicine | Admitting: Emergency Medicine

## 2020-08-02 DIAGNOSIS — I120 Hypertensive chronic kidney disease with stage 5 chronic kidney disease or end stage renal disease: Secondary | ICD-10-CM | POA: Diagnosis not present

## 2020-08-02 DIAGNOSIS — N186 End stage renal disease: Secondary | ICD-10-CM | POA: Diagnosis not present

## 2020-08-02 DIAGNOSIS — R000 Tachycardia, unspecified: Secondary | ICD-10-CM | POA: Diagnosis not present

## 2020-08-02 DIAGNOSIS — R0602 Shortness of breath: Secondary | ICD-10-CM | POA: Insufficient documentation

## 2020-08-02 DIAGNOSIS — Z992 Dependence on renal dialysis: Secondary | ICD-10-CM | POA: Insufficient documentation

## 2020-08-02 DIAGNOSIS — R112 Nausea with vomiting, unspecified: Secondary | ICD-10-CM | POA: Diagnosis present

## 2020-08-02 DIAGNOSIS — Z79899 Other long term (current) drug therapy: Secondary | ICD-10-CM | POA: Diagnosis not present

## 2020-08-02 DIAGNOSIS — E1022 Type 1 diabetes mellitus with diabetic chronic kidney disease: Secondary | ICD-10-CM | POA: Insufficient documentation

## 2020-08-02 DIAGNOSIS — Z8616 Personal history of COVID-19: Secondary | ICD-10-CM | POA: Diagnosis not present

## 2020-08-02 DIAGNOSIS — Z7984 Long term (current) use of oral hypoglycemic drugs: Secondary | ICD-10-CM | POA: Diagnosis not present

## 2020-08-02 DIAGNOSIS — Z794 Long term (current) use of insulin: Secondary | ICD-10-CM | POA: Insufficient documentation

## 2020-08-02 DIAGNOSIS — K3184 Gastroparesis: Secondary | ICD-10-CM | POA: Diagnosis not present

## 2020-08-02 DIAGNOSIS — J45909 Unspecified asthma, uncomplicated: Secondary | ICD-10-CM | POA: Diagnosis not present

## 2020-08-02 DIAGNOSIS — E1040 Type 1 diabetes mellitus with diabetic neuropathy, unspecified: Secondary | ICD-10-CM | POA: Insufficient documentation

## 2020-08-02 LAB — CBC WITH DIFFERENTIAL/PLATELET
Abs Immature Granulocytes: 0.03 10*3/uL (ref 0.00–0.07)
Basophils Absolute: 0 10*3/uL (ref 0.0–0.1)
Basophils Relative: 1 %
Eosinophils Absolute: 0 10*3/uL (ref 0.0–0.5)
Eosinophils Relative: 0 %
HCT: 43.2 % (ref 36.0–46.0)
Hemoglobin: 13.9 g/dL (ref 12.0–15.0)
Immature Granulocytes: 0 %
Lymphocytes Relative: 16 %
Lymphs Abs: 1.1 10*3/uL (ref 0.7–4.0)
MCH: 26.7 pg (ref 26.0–34.0)
MCHC: 32.2 g/dL (ref 30.0–36.0)
MCV: 83.1 fL (ref 80.0–100.0)
Monocytes Absolute: 0.5 10*3/uL (ref 0.1–1.0)
Monocytes Relative: 7 %
Neutro Abs: 5.2 10*3/uL (ref 1.7–7.7)
Neutrophils Relative %: 76 %
Platelets: 375 10*3/uL (ref 150–400)
RBC: 5.2 MIL/uL — ABNORMAL HIGH (ref 3.87–5.11)
RDW: 16.6 % — ABNORMAL HIGH (ref 11.5–15.5)
WBC: 6.8 10*3/uL (ref 4.0–10.5)
nRBC: 0 % (ref 0.0–0.2)

## 2020-08-02 LAB — LIPASE, BLOOD: Lipase: 34 U/L (ref 11–51)

## 2020-08-02 LAB — COMPREHENSIVE METABOLIC PANEL
ALT: 13 U/L (ref 0–44)
AST: 17 U/L (ref 15–41)
Albumin: 3.7 g/dL (ref 3.5–5.0)
Alkaline Phosphatase: 59 U/L (ref 38–126)
Anion gap: 20 — ABNORMAL HIGH (ref 5–15)
BUN: 22 mg/dL — ABNORMAL HIGH (ref 6–20)
CO2: 24 mmol/L (ref 22–32)
Calcium: 10 mg/dL (ref 8.9–10.3)
Chloride: 91 mmol/L — ABNORMAL LOW (ref 98–111)
Creatinine, Ser: 5.79 mg/dL — ABNORMAL HIGH (ref 0.44–1.00)
GFR, Estimated: 10 mL/min — ABNORMAL LOW (ref >60)
Glucose, Bld: 298 mg/dL — ABNORMAL HIGH (ref 70–99)
Potassium: 3 mmol/L — ABNORMAL LOW (ref 3.5–5.1)
Sodium: 135 mmol/L (ref 135–145)
Total Bilirubin: 1.1 mg/dL (ref 0.3–1.2)
Total Protein: 7.5 g/dL (ref 6.5–8.1)

## 2020-08-02 LAB — I-STAT BETA HCG BLOOD, ED (MC, WL, AP ONLY): I-stat hCG, quantitative: <5 m[IU]/mL (ref <5)

## 2020-08-02 LAB — I-STAT CHEM 8, ED
BUN: 22 mg/dL — ABNORMAL HIGH (ref 6–20)
Calcium, Ion: 1.12 mmol/L — ABNORMAL LOW (ref 1.15–1.40)
Chloride: 94 mmol/L — ABNORMAL LOW (ref 98–111)
Creatinine, Ser: 6 mg/dL — ABNORMAL HIGH (ref 0.44–1.00)
Glucose, Bld: 299 mg/dL — ABNORMAL HIGH (ref 70–99)
HCT: 46 % (ref 36.0–46.0)
Hemoglobin: 15.6 g/dL — ABNORMAL HIGH (ref 12.0–15.0)
Potassium: 3 mmol/L — ABNORMAL LOW (ref 3.5–5.1)
Sodium: 134 mmol/L — ABNORMAL LOW (ref 135–145)
TCO2: 23 mmol/L (ref 22–32)

## 2020-08-02 MED ORDER — MORPHINE SULFATE (PF) 4 MG/ML IV SOLN
4.0000 mg | Freq: Once | INTRAVENOUS | Status: AC
  Administered 2020-08-02: 4 mg via INTRAVENOUS
  Filled 2020-08-02: qty 1

## 2020-08-02 MED ORDER — LACTATED RINGERS IV BOLUS
500.0000 mL | Freq: Once | INTRAVENOUS | Status: AC
  Administered 2020-08-02: 500 mL via INTRAVENOUS

## 2020-08-02 MED ORDER — ONDANSETRON 4 MG PO TBDP: 4.0000 mg | ORAL_TABLET | Freq: Three times a day (TID) | ORAL | 0 refills | Status: DC | PRN

## 2020-08-02 MED ORDER — DROPERIDOL 2.5 MG/ML IJ SOLN
1.2500 mg | Freq: Once | INTRAMUSCULAR | Status: AC
  Administered 2020-08-02: 1.25 mg via INTRAVENOUS
  Filled 2020-08-02: qty 2

## 2020-08-02 MED ORDER — PROMETHAZINE HCL 25 MG RE SUPP: 25.0000 mg | Freq: Four times a day (QID) | RECTAL | 0 refills | Status: DC | PRN

## 2020-08-02 MED ORDER — DIPHENHYDRAMINE HCL 50 MG/ML IJ SOLN
25.0000 mg | Freq: Once | INTRAMUSCULAR | Status: AC
  Administered 2020-08-02: 25 mg via INTRAVENOUS
  Filled 2020-08-02: qty 1

## 2020-08-02 NOTE — ED Notes (Signed)
This RN attempted IV access x 2; unsuccessful. Mitzi Hansen RN attempted x 1 with [name] , unsuccessful. IV team consult placed, EDP made aware.

## 2020-08-02 NOTE — ED Provider Notes (Signed)
[facility] EMERGENCY DEPARTMENT Provider Note   CSN: [id number] Arrival date & time: 08/02/20  1832     History Chief Complaint  Patient presents with   Nausea   Emesis   Shortness of Breath    Barbara Donovan is a 30 y.o. female.  30 yo F with a chief complaints of nausea and vomiting going on for a couple days now.  Patient has a history of poorly controlled type 1 diabetes resulting in end-stage renal disease where she gets dialysis Monday Tuesday Thursday and Friday.  Last got dialysis yesterday and had a full session.  Denies fevers or chills.  Denies diarrhea.  Has abdominal pain consistent with these episodes.  She thinks it feels a little bit like when she has gastroparesis and a little bit like when she has diabetic ketoacidosis.  The history is provided by the patient.  Emesis Associated symptoms: abdominal pain   Associated symptoms: no arthralgias, no chills, no fever, no headaches and no myalgias   Shortness of Breath Associated symptoms: abdominal pain and vomiting   Associated symptoms: no chest pain, no fever, no headaches and no wheezing   Illness Severity:  Moderate Onset quality:  Gradual Duration:  2 days Timing:  Constant Progression:  Worsening Chronicity:  Recurrent Associated symptoms: abdominal pain, nausea, shortness of breath and vomiting   Associated symptoms: no chest pain, no congestion, no fever, no headaches, no myalgias, no rhinorrhea and no wheezing       Past Medical History:  Diagnosis Date   Asthma    as a child, no problems as an adult, no inhaler   Cataract    NS OU   Chronic hypertension during pregnancy, antepartum 08/19/2017   Dehydration 01/28/2018   Depression during pregnancy, antepartum 07/07/2017   6/20: Short trial of zoloft previously, reports didn't help much but also didn't give it a chance Discussed r/b/a SSRIs in pregnancy, agrees to try Zoloft again, rx sent No SI/HI/red flags   Diabetes ([place])     TYPE I. A1C 7.5% 05/31/20   Diabetic retinopathy ([place]) 06/09/2017   07/2017 with bilateral severe diabetic non-proliferative retinopathy with macular edema.   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 A2/B GDM)  Required Referrals for A1GDM or A2GDM: '[x]'$  Diabetes Education and Testing Supplies '[x]'$  Nutrition Cousult  For A2/B GDM or higher classes of DM '[x]'$  Diabetes Education and Testing Supplies '[x]'$  Nutrition Counsult '[x]'$  Fetal ECHO after 20 weeks  '[x]'$  Eye exam for retina evaluation - severe retinopathy 7/19  Base   Ventricular septal defect (VSD) of fetus in singleton pregnancy, antepartum 09/30/2017   May go to newborn nursery per Dr. Lenard Simmer Echo prior to discharge    Patient Active Problem List   Diagnosis Date Noted   Renal failure (ARF), acute on chronic ([place]) 06/01/2020   ARF (acute renal failure) (HCC)    Nausea & vomiting    Diabetic gastroparesis ([place]) 03/04/2020   Anemia due  to stage 5 chronic kidney disease ([place]) 03/04/2020   Diabetic ketoacidosis without coma associated with type 1 diabetes mellitus (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   Proteinuria due to  type 1 diabetes mellitus ([place]) 11/02/2017   Chronic hypertension 08/21/2017   Uncontrolled type 1 diabetes mellitus with stage 5 chronic kidney disease not on chronic dialysis, with long-term current use of insulin ([place]) 07/07/2017   Diabetic retinopathy ([place]) 06/09/2017   Acute depression 11/02/2014   Asthma 10/30/2013    Past Surgical History:  Procedure Laterality Date   25 GAUGE PARS PLANA VITRECTOMY WITH 20 GAUGE MVR PORT FOR MACULAR HOLE Left 07/20/2018   Procedure: 25 GAUGE PARS PLANA VITRECTOMY LEFT EYE;  Surgeon: Bernarda Caffey, MD;  Location: [place];  Service: Ophthalmology;  Laterality: Left;   CESAREAN SECTION N/A 12/26/2017   Procedure: CESAREAN SECTION;  Surgeon: Osborne Oman, MD;  Location: [place];  Service: Obstetrics;  Laterality: N/A;   EYE EXAMINATION UNDER ANESTHESIA Right 07/20/2018   Procedure: Eye Exam Under Anesthesia RIGHT EYE;  Surgeon: Bernarda Caffey, MD;  Location: [place];  Service: Ophthalmology;  Laterality: Right;   EYE SURGERY Left 07/2018   GAS INSERTION Left 07/19/2019   Procedure: INSERTION OF GAS;  Surgeon: Bernarda Caffey, MD;  Location: [place];  Service: Ophthalmology;  Laterality: Left;  SF6   INJECTION OF SILICONE OIL Left 99991111   Procedure: Injection Of Silicone Oil LEFT EYE;  Surgeon: Bernarda Caffey, MD;  Location: [place];  Service: Ophthalmology;  Laterality: Left;   IR FLUORO GUIDE CV LINE RIGHT  06/02/2020   IR US GUIDE VASC ACCESS RIGHT  06/02/2020   LASER PHOTO ABLATION Right 07/20/2018   Procedure: Laser Photo Ablation RIGHT EYE;  Surgeon: Bernarda Caffey, MD;  Location: [place];  Service: Ophthalmology;  Laterality: Right;   MEMBRANE PEEL Left 07/20/2018   Procedure: Membrane Peel LEFT EYE;  Surgeon: Bernarda Caffey, MD;  Location: [place];  Service: Ophthalmology;  Laterality: Left;   MEMBRANE PEEL Left 07/19/2019   Procedure: MEMBRANE PEEL;  Surgeon: Bernarda Caffey, MD;  Location: [place];  Service: Ophthalmology;  Laterality: Left;   MITOMYCIN C  APPLICATION Bilateral 99991111   Procedure: Avastin Application;  Surgeon: Bernarda Caffey, MD;  Location: [place];  Service: Ophthalmology;  Laterality: Bilateral;   PHOTOCOAGULATION WITH LASER Left 07/20/2018   Procedure: Photocoagulation With Laser LEFT EYE;  Surgeon: Bernarda Caffey, MD;  Location: [place];  Service: Ophthalmology;  Laterality: Left;   PHOTOCOAGULATION WITH LASER Left 07/19/2019   Procedure: PHOTOCOAGULATION WITH LASER;  Surgeon: Bernarda Caffey, MD;  Location: [place];  Service: Ophthalmology;  Laterality: Left;   RETINAL DETACHMENT SURGERY Left 07/20/2018   Dr. Bernarda Caffey   SILICON OIL REMOVAL Left 07/19/2019   Procedure: 25g PARS PLANA VITRECTOMY WITH SILICON OIL REMOVAL;  Surgeon: Bernarda Caffey, MD;  Location: [place];  Service: Ophthalmology;  Laterality: Left;   WISDOM TOOTH EXTRACTION       OB History     Gravida  1   Para  1   Term      Preterm  1   AB      Living  1      SAB  IAB      Ectopic      Multiple  0   Live Births  1           Family History  Problem Relation Age of Onset   Diabetes Mother    Aneurysm Mother 57   Seizures Mother    Diabetes Father    Cataracts Father    COPD Father    Heart attack Father    Heart disease Father    Healthy Sister    Healthy Daughter    Stroke Maternal Grandfather    Amblyopia Neg Hx    Blindness Neg Hx    Glaucoma Neg Hx    Macular degeneration Neg Hx    Retinal detachment Neg Hx    Strabismus Neg Hx    Retinitis pigmentosa Neg Hx    Colon cancer Neg Hx    Stomach cancer Neg Hx    Esophageal cancer Neg Hx    Pancreatic cancer Neg Hx    Liver disease Neg Hx     Social History   Tobacco Use   Smoking status: Never   Smokeless tobacco: Never  Vaping Use   Vaping Use: Never used  Substance Use Topics   Alcohol use: Not Currently    Comment: SOCIALLY   Drug use: Never    Home Medications Prior to Admission medications   Medication Sig Start Date End Date Taking? Authorizing  Provider  ondansetron (ZOFRAN ODT) 4 MG disintegrating tablet Take 1 tablet (4 mg total) by mouth every 8 (eight) hours as needed for nausea or vomiting. 08/02/20  Yes Deno Etienne, DO  promethazine (PHENERGAN) 25 MG suppository Place 1 suppository (25 mg total) rectally every 6 (six) hours as needed for nausea or vomiting. 08/02/20  Yes Deno Etienne, DO  amLODipine (NORVASC) 10 MG tablet Take 1 tablet (10 mg total) by mouth daily. 03/09/20   Mercy Riding, MD  carvedilol (COREG) 25 MG tablet Take 1 tablet (25 mg total) by mouth 2 (two) times daily. 03/07/19   Imogene Burn, PA-C  Continuous Blood Gluc Sensor MISC 1 each by Does not apply route as directed. Use as directed every 14 days. May dispense FreeStyle [employer] or similar. 01/27/20   Antonieta Pert, MD  furosemide (LASIX) 80 MG tablet Take 1 tablet (80 mg total) by mouth daily. Patient not taking: No sig reported 06/09/20   Geradine Girt, DO  hydrALAZINE (APRESOLINE) 50 MG tablet Take 2 tablets (100 mg total) by mouth 3 (three) times daily. 06/09/20   Geradine Girt, DO  insulin glargine (LANTUS SOLOSTAR) 100 UNIT/ML Solostar Pen Inject 10 Units into the skin every morning. Patient taking differently: Inject 16 Units into the skin every morning. 06/09/20   Geradine Girt, DO  insulin lispro (HUMALOG KWIKPEN) 100 UNIT/ML KwikPen Inject 2 Units into the skin 3 (three) times daily with meals. 04/25/20   Renato Shin, MD  latanoprost (XALATAN) 0.005 % ophthalmic solution Place 1 drop into the left eye at bedtime. Patient not taking: No sig reported 06/09/20   Geradine Girt, DO  losartan (COZAAR) 50 MG tablet Take 2 tablets (100 mg total) by mouth daily. Patient not taking: No sig reported 06/09/20   Geradine Girt, DO  metoCLOPramide (REGLAN) 5 MG tablet Take 5 mg by mouth daily as needed for nausea or vomiting. 07/01/20   [provider]  multivitamin (RENA-VIT) TABS tablet Take 1 tablet by mouth at bedtime. 03/08/20   Cyndia Skeeters,  Charlesetta Ivory, MD   pantoprazole (PROTONIX) 40 MG tablet Take 1 tablet (40 mg total) by mouth daily. 01/31/20   Ladene Artist, MD    Allergies    Patient has no known allergies.  Review of Systems   Review of Systems  Constitutional:  Negative for chills and fever.  HENT:  Negative for congestion and rhinorrhea.   Eyes:  Negative for redness and visual disturbance.  Respiratory:  Positive for shortness of breath. Negative for wheezing.   Cardiovascular:  Negative for chest pain and palpitations.  Gastrointestinal:  Positive for abdominal pain, nausea and vomiting.  Genitourinary:  Negative for dysuria and urgency.  Musculoskeletal:  Negative for arthralgias and myalgias.  Skin:  Negative for pallor and wound.  Neurological:  Negative for dizziness and headaches.   Physical Exam Updated Vital Signs BP (!) 154/110   Pulse 100   Resp 14   Ht '5\' 6"'$  (1.676 m)   Wt 59 kg   SpO2 100%   BMI 20.98 kg/m   Physical Exam Vitals and nursing note reviewed.  Constitutional:      General: She is not in acute distress.    Appearance: She is well-developed. She is not diaphoretic.  HENT:     Head: Normocephalic and atraumatic.  Eyes:     Pupils: Pupils are equal, round, and reactive to light.  Cardiovascular:     Rate and Rhythm: Regular rhythm. Tachycardia present.     Heart sounds: No murmur heard.   No friction rub. No gallop.  Pulmonary:     Effort: Pulmonary effort is normal. Tachypnea present.     Breath sounds: No wheezing or rales.  Abdominal:     General: There is no distension.     Palpations: Abdomen is soft.     Tenderness: There is no abdominal tenderness.     Comments: Mild diffuse abdominal discomfort.  Musculoskeletal:        General: No tenderness.     Cervical back: Normal range of motion and neck supple.  Skin:    General: Skin is warm and dry.  Neurological:     Mental Status: She is alert and oriented to person, place, and time.  Psychiatric:        Behavior: Behavior  normal.    ED Results / Procedures / Treatments   Labs (all labs ordered are listed, but only abnormal results are displayed) Labs Reviewed  CBC WITH DIFFERENTIAL/PLATELET - Abnormal; Notable for the following components:      Result Value   RBC 5.20 (*)    RDW 16.6 (*)    All other components within normal limits  COMPREHENSIVE METABOLIC PANEL - Abnormal; Notable for the following components:   Potassium 3.0 (*)    Chloride 91 (*)    Glucose, Bld 298 (*)    BUN 22 (*)    Creatinine, Ser 5.79 (*)    GFR, Estimated 10 (*)    Anion gap 20 (*)    All other components within normal limits  I-STAT CHEM 8, ED - Abnormal; Notable for the following components:   Sodium 134 (*)    Potassium 3.0 (*)    Chloride 94 (*)    BUN 22 (*)    Creatinine, Ser 6.00 (*)    Glucose, Bld 299 (*)    Calcium, Ion 1.12 (*)    Hemoglobin 15.6 (*)    All other components within normal limits  LIPASE, BLOOD  I-STAT BETA HCG BLOOD, ED ([facility],  [facility], [facility] ONLY)    EKG EKG Interpretation  Date/Time:  Saturday August 02 2020 18:37:37 EDT Ventricular Rate:  128 PR Interval:  143 QRS Duration: 91 QT Interval:  321 QTC Calculation: 469 R Axis:   71 Text Interpretation: Sinus tachycardia Probable left atrial enlargement Probable left ventricular hypertrophy Rate faster Otherwise no significant change Confirmed by Deno Etienne [phone]) on 08/02/2020 8:26:51 PM  Radiology No results found.  Procedures Procedures   Medications Ordered in ED Medications  lactated ringers bolus 500 mL (0 mLs Intravenous Stopped 08/02/20 2145)  droperidol (INAPSINE) 2.5 MG/ML injection 1.25 mg (1.25 mg Intravenous Given 08/02/20 2014)  diphenhydrAMINE (BENADRYL) injection 25 mg (25 mg Intravenous Given 08/02/20 2016)  morphine 4 MG/ML injection 4 mg (4 mg Intravenous Given 08/02/20 2017)    ED Course  I have reviewed the triage vital signs and the nursing notes.  Pertinent labs & imaging results that were available during my care  of the patient were reviewed by me and considered in my medical decision making (see chart for details).    MDM Rules/Calculators/A&P                          30 yo F with a chief complaints of nausea and vomiting going on for about 48 hours now.  Patient unfortunately has poorly controlled type 1 diabetes and has had multiple episodes of this as well as diabetic ketoacidosis.  She is on dialysis and last had a session yesterday.  Gets dialysis 4 days a week.  Denies any missed sessions.  We will obtain a laboratory evaluation small bolus of IV fluids  symptomatic therapy reassess.  Patient feeling better on reassessment.  Tolerating p.o.  Will discharge home.  PCP follow-up.  10:40 PM:  I have discussed the diagnosis/risks/treatment options with the patient and believe the pt to be eligible for discharge home to follow-up with PCP. We also discussed returning to the ED immediately if new or worsening sx occur. We discussed the sx which are most concerning (e.g., sudden worsening pain, fever, inability to tolerate by mouth) that necessitate immediate return. Medications administered to the patient during their visit and any new prescriptions provided to the patient are listed below.  Medications given during this visit Medications  lactated ringers bolus 500 mL (0 mLs Intravenous Stopped 08/02/20 2145)  droperidol (INAPSINE) 2.5 MG/ML injection 1.25 mg (1.25 mg Intravenous Given 08/02/20 2014)  diphenhydrAMINE (BENADRYL) injection 25 mg (25 mg Intravenous Given 08/02/20 2016)  morphine 4 MG/ML injection 4 mg (4 mg Intravenous Given 08/02/20 2017)     The patient appears reasonably screen and/or stabilized for discharge and I doubt any other medical condition or other [facility] requiring further screening, evaluation, or treatment in the ED at this time prior to discharge.   Final Clinical Impression(s) / ED Diagnoses Final diagnoses:  Gastroparesis    Rx / DC Orders ED Discharge Orders           Ordered    ondansetron (ZOFRAN ODT) 4 MG disintegrating tablet  Every 8 hours PRN        08/02/20 2214    promethazine (PHENERGAN) 25 MG suppository  Every 6 hours PRN        08/02/20 2214             Deno Etienne, DO 08/02/20 2240

## 2020-08-02 NOTE — Discharge Instructions (Addendum)
Follow up with your family doc.  °

## 2020-08-02 NOTE — ED Notes (Addendum)
Pt pulse ox dropping below 70, RN notified and pt placed on 2 L oxygen

## 2020-08-02 NOTE — ED Notes (Signed)
Help get patient into a gown on the monitor got patient some warm blankets and a pillow patient is resting with call bell in reach

## 2020-08-02 NOTE — ED Triage Notes (Signed)
Pt to ED via EMS from home c/o N/V X 2 days, Which is a recurrent problem .pt also c/o SHOB that started today. Yesterday pt went to dialysis and received full treatment. Medical hx:htn, DM. '4MG'$  Zofran given by EMS. Last VS:170/130, HR127, 100% [place] 2L. Cbg 322.

## 2020-08-19 ENCOUNTER — Emergency Department (HOSPITAL_COMMUNITY)
Admission: EM | Admit: 2020-08-19 | Discharge: 2020-08-20 | Disposition: A | Discharge status: Home / Self Care | Payer: 59 | Attending: Emergency Medicine | Admitting: Emergency Medicine

## 2020-08-19 DIAGNOSIS — J45909 Unspecified asthma, uncomplicated: Secondary | ICD-10-CM | POA: Insufficient documentation

## 2020-08-19 DIAGNOSIS — Z992 Dependence on renal dialysis: Secondary | ICD-10-CM | POA: Diagnosis not present

## 2020-08-19 DIAGNOSIS — E1010 Type 1 diabetes mellitus with ketoacidosis without coma: Secondary | ICD-10-CM | POA: Diagnosis not present

## 2020-08-19 DIAGNOSIS — E1029 Type 1 diabetes mellitus with other diabetic kidney complication: Secondary | ICD-10-CM | POA: Diagnosis not present

## 2020-08-19 DIAGNOSIS — R112 Nausea with vomiting, unspecified: Secondary | ICD-10-CM | POA: Diagnosis present

## 2020-08-19 DIAGNOSIS — E1022 Type 1 diabetes mellitus with diabetic chronic kidney disease: Secondary | ICD-10-CM | POA: Diagnosis not present

## 2020-08-19 DIAGNOSIS — Z79899 Other long term (current) drug therapy: Secondary | ICD-10-CM | POA: Insufficient documentation

## 2020-08-19 DIAGNOSIS — N185 Chronic kidney disease, stage 5: Secondary | ICD-10-CM | POA: Diagnosis not present

## 2020-08-19 DIAGNOSIS — D631 Anemia in chronic kidney disease: Secondary | ICD-10-CM | POA: Insufficient documentation

## 2020-08-19 DIAGNOSIS — Z8616 Personal history of COVID-19: Secondary | ICD-10-CM | POA: Insufficient documentation

## 2020-08-19 DIAGNOSIS — E10319 Type 1 diabetes mellitus with unspecified diabetic retinopathy without macular edema: Secondary | ICD-10-CM | POA: Insufficient documentation

## 2020-08-19 DIAGNOSIS — I120 Hypertensive chronic kidney disease with stage 5 chronic kidney disease or end stage renal disease: Secondary | ICD-10-CM | POA: Insufficient documentation

## 2020-08-19 DIAGNOSIS — R1084 Generalized abdominal pain: Secondary | ICD-10-CM | POA: Insufficient documentation

## 2020-08-19 LAB — COMPREHENSIVE METABOLIC PANEL
ALT: 12 U/L (ref 0–44)
AST: 16 U/L (ref 15–41)
Albumin: 3.2 g/dL — ABNORMAL LOW (ref 3.5–5.0)
Alkaline Phosphatase: 60 U/L (ref 38–126)
Anion gap: 14 (ref 5–15)
BUN: 16 mg/dL (ref 6–20)
CO2: 27 mmol/L (ref 22–32)
Calcium: 9.2 mg/dL (ref 8.9–10.3)
Chloride: 97 mmol/L — ABNORMAL LOW (ref 98–111)
Creatinine, Ser: 4.73 mg/dL — ABNORMAL HIGH (ref 0.44–1.00)
GFR, Estimated: 12 mL/min — ABNORMAL LOW (ref >60)
Glucose, Bld: 253 mg/dL — ABNORMAL HIGH (ref 70–99)
Potassium: 3.6 mmol/L (ref 3.5–5.1)
Sodium: 138 mmol/L (ref 135–145)
Total Bilirubin: 1.2 mg/dL (ref 0.3–1.2)
Total Protein: 6.9 g/dL (ref 6.5–8.1)

## 2020-08-19 LAB — CBC WITH DIFFERENTIAL/PLATELET
Abs Immature Granulocytes: 0.05 10*3/uL (ref 0.00–0.07)
Basophils Absolute: 0.1 10*3/uL (ref 0.0–0.1)
Basophils Relative: 1 %
Eosinophils Absolute: 0 10*3/uL (ref 0.0–0.5)
Eosinophils Relative: 0 %
HCT: 41.4 % (ref 36.0–46.0)
Hemoglobin: 13 g/dL (ref 12.0–15.0)
Immature Granulocytes: 1 %
Lymphocytes Relative: 13 %
Lymphs Abs: 1.2 10*3/uL (ref 0.7–4.0)
MCH: 26.8 pg (ref 26.0–34.0)
MCHC: 31.4 g/dL (ref 30.0–36.0)
MCV: 85.4 fL (ref 80.0–100.0)
Monocytes Absolute: 0.4 10*3/uL (ref 0.1–1.0)
Monocytes Relative: 4 %
Neutro Abs: 7.5 10*3/uL (ref 1.7–7.7)
Neutrophils Relative %: 81 %
Platelets: 311 10*3/uL (ref 150–400)
RBC: 4.85 MIL/uL (ref 3.87–5.11)
RDW: 17.3 % — ABNORMAL HIGH (ref 11.5–15.5)
WBC: 9.2 10*3/uL (ref 4.0–10.5)
nRBC: 0 % (ref 0.0–0.2)

## 2020-08-19 LAB — I-STAT BETA HCG BLOOD, ED (MC, WL, AP ONLY): I-stat hCG, quantitative: <5 m[IU]/mL (ref <5)

## 2020-08-19 LAB — LIPASE, BLOOD: Lipase: 34 U/L (ref 11–51)

## 2020-08-19 MED ORDER — METOCLOPRAMIDE HCL 5 MG/ML IJ SOLN
10.0000 mg | Freq: Once | INTRAMUSCULAR | Status: AC
  Administered 2020-08-19: 10 mg via INTRAVENOUS
  Filled 2020-08-19: qty 2

## 2020-08-19 MED ORDER — CARVEDILOL 12.5 MG PO TABS
25.0000 mg | ORAL_TABLET | Freq: Two times a day (BID) | ORAL | Status: DC
  Filled 2020-08-19: qty 2

## 2020-08-19 MED ORDER — HYDRALAZINE HCL 25 MG PO TABS
50.0000 mg | ORAL_TABLET | Freq: Once | ORAL | Status: AC
  Administered 2020-08-19: 50 mg via ORAL
  Filled 2020-08-19: qty 2

## 2020-08-19 NOTE — ED Provider Notes (Signed)
[facility] EMERGENCY DEPARTMENT Provider Note   CSN: [id number] Arrival date & time: 08/19/20  1832     History Chief Complaint  Patient presents with   Emesis   Abdominal Pain    Barbara Donovan is a 30 y.o. female.  30 year old female brought in by EMS from home with upper abdominal pain with nausea and vomiting.  Patient was given fentanyl and Zofran by EMS with resolution of her symptoms.  Patient tried Zofran, Reglan, Phenergan suppositories at home without relief.  Symptoms are recurrent in nature, unchanged from prior episodes.  Attends dialysis Monday, Tuesday, Thursday, Friday and hopes to transition to in-home dialysis.  Patient did in dialysis with 1 hour remaining today due to feeling unwell.      Past Medical History:  Diagnosis Date   Asthma    as a child, no problems as an adult, no inhaler   Cataract    NS OU   Chronic hypertension during pregnancy, antepartum 08/19/2017   Dehydration 01/28/2018   Depression during pregnancy, antepartum 07/07/2017   6/20: Short trial of zoloft previously, reports didn't help much but also didn't give it a chance Discussed r/b/a SSRIs in pregnancy, agrees to try Zoloft again, rx sent No SI/HI/red flags   Diabetes ([place])    TYPE I. A1C 7.5% 05/31/20   Diabetic retinopathy ([place]) 06/09/2017   07/2017 with bilateral severe diabetic non-proliferative retinopathy with macular edema.   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 A2/B GDM)  Required Referrals for A1GDM or A2GDM: '[x]'$  Diabetes Education and Testing Supplies '[x]'$  Nutrition Cousult  For A2/B GDM or higher classes of DM '[x]'$  Diabetes Education and Testing Supplies '[x]'$  Nutrition Counsult '[x]'$  Fetal ECHO after 20 weeks  '[x]'$  Eye exam for retina evaluation - severe retinopathy 7/19  Base   Ventricular septal defect (VSD) of fetus in singleton pregnancy, antepartum 09/30/2017   May go to newborn nursery per Dr. Lenard Simmer Echo prior to discharge    Patient Active Problem List   Diagnosis Date Noted   Renal failure (ARF), acute on chronic ([place]) 06/01/2020   ARF (acute renal failure) (HCC)    Nausea & vomiting    Diabetic gastroparesis ([place]) 03/04/2020   Anemia due to stage 5 chronic kidney disease ([place]) 03/04/2020   Diabetic ketoacidosis without coma associated with type 1 diabetes mellitus ([place]) 02/09/2020   Hypothermia 01/24/2020   DKA (diabetic ketoacidosis) ([place]) 09/14/2019   COVID-19 virus infection 09/14/2019   SOB (shortness of breath) 03/27/2018   Hypomagnesemia 01/28/2018   Dehydration 01/28/2018   Intractable nausea and vomiting 01/22/2018   Hypertensive urgency 01/22/2018   Hypokalemia 01/22/2018   Nephropathy, 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   Proteinuria due to type 1 diabetes mellitus ([place]) 11/02/2017   Chronic hypertension 08/21/2017   Uncontrolled type 1 diabetes mellitus with stage 5 chronic kidney disease not on chronic dialysis, with long-term current use of insulin ([place]) 07/07/2017   Diabetic retinopathy ([place]) 06/09/2017   Acute  depression 11/02/2014   Asthma 10/30/2013    Past Surgical History:  Procedure Laterality Date   25 GAUGE PARS PLANA VITRECTOMY WITH 20 GAUGE MVR PORT FOR MACULAR HOLE Left 07/20/2018   Procedure: 25 GAUGE PARS PLANA VITRECTOMY LEFT EYE;  Surgeon: Bernarda Caffey, MD;  Location: [place];  Service: Ophthalmology;  Laterality: Left;   CESAREAN SECTION N/A 12/26/2017   Procedure: CESAREAN SECTION;  Surgeon: Osborne Oman,  MD;  Location: [place];  Service: Obstetrics;  Laterality: N/A;   EYE EXAMINATION UNDER ANESTHESIA Right 07/20/2018   Procedure: Eye Exam Under Anesthesia RIGHT EYE;  Surgeon: Bernarda Caffey, MD;  Location: [place];  Service: Ophthalmology;  Laterality: Right;   EYE SURGERY Left 07/2018   GAS INSERTION Left 07/19/2019   Procedure: INSERTION OF GAS;  Surgeon: Bernarda Caffey, MD;  Location: [place];  Service: Ophthalmology;  Laterality: Left;  SF6   INJECTION OF SILICONE OIL Left 99991111   Procedure: Injection Of Silicone Oil LEFT EYE;  Surgeon: Bernarda Caffey, MD;  Location: [place];  Service: Ophthalmology;  Laterality: Left;   IR FLUORO GUIDE CV LINE RIGHT  06/02/2020   IR US GUIDE VASC ACCESS RIGHT  06/02/2020   LASER PHOTO ABLATION Right 07/20/2018   Procedure: Laser Photo Ablation RIGHT EYE;  Surgeon: Bernarda Caffey, MD;  Location: [place];  Service: Ophthalmology;  Laterality: Right;   MEMBRANE PEEL Left 07/20/2018   Procedure: Membrane Peel LEFT EYE;  Surgeon: Bernarda Caffey, MD;  Location: [place];  Service: Ophthalmology;  Laterality: Left;   MEMBRANE PEEL Left 07/19/2019   Procedure: MEMBRANE PEEL;  Surgeon: Bernarda Caffey, MD;  Location: [place];  Service: Ophthalmology;  Laterality: Left;   MITOMYCIN C APPLICATION Bilateral 99991111   Procedure: Avastin Application;  Surgeon: Bernarda Caffey, MD;  Location: [place];  Service: Ophthalmology;  Laterality: Bilateral;   PHOTOCOAGULATION WITH LASER Left 07/20/2018   Procedure: Photocoagulation With Laser LEFT EYE;  Surgeon: Bernarda Caffey, MD;  Location: [place];  Service: Ophthalmology;  Laterality: Left;   PHOTOCOAGULATION WITH LASER Left 07/19/2019   Procedure: PHOTOCOAGULATION WITH LASER;  Surgeon: Bernarda Caffey, MD;  Location: [place];  Service: Ophthalmology;  Laterality: Left;   RETINAL DETACHMENT SURGERY Left 07/20/2018   Dr. Bernarda Caffey   SILICON OIL REMOVAL Left 07/19/2019   Procedure: 25g PARS PLANA VITRECTOMY WITH SILICON OIL REMOVAL;  Surgeon: Bernarda Caffey, MD;  Location: [place];  Service: Ophthalmology;  Laterality: Left;   WISDOM TOOTH EXTRACTION       OB History     Gravida  1   Para  1   Term      Preterm  1   AB      Living  1      SAB      IAB      Ectopic      Multiple  0   Live Births  1           Family History  Problem Relation Age of Onset   Diabetes Mother    Aneurysm Mother 19   Seizures Mother    Diabetes Father    Cataracts Father    COPD Father    Heart attack Father    Heart disease Father    Healthy Sister    Healthy Daughter    Stroke Maternal Grandfather    Amblyopia Neg Hx    Blindness Neg Hx    Glaucoma Neg Hx    Macular degeneration Neg Hx  Retinal detachment Neg Hx    Strabismus Neg Hx    Retinitis pigmentosa Neg Hx    Colon cancer Neg Hx    Stomach cancer Neg Hx    Esophageal cancer Neg Hx    Pancreatic cancer Neg Hx    Liver disease Neg Hx     Social History   Tobacco Use   Smoking status: Never   Smokeless tobacco: Never  Vaping Use   Vaping Use: Never used  Substance Use Topics   Alcohol use: Not Currently    Comment: SOCIALLY   Drug use: Never    Home Medications Prior to Admission medications   Medication Sig Start Date End Date Taking? Authorizing Provider  amLODipine (NORVASC) 10 MG tablet Take 1 tablet (10 mg total) by mouth daily. 03/09/20   Mercy Riding, MD  carvedilol (COREG) 25 MG tablet Take 1 tablet (25 mg total) by mouth 2 (two) times daily. 03/07/19   Imogene Burn, PA-C  Continuous Blood Gluc Sensor MISC 1 each by Does not apply route as directed. Use as directed every 14 days. May dispense FreeStyle [employer] or similar. 01/27/20   Antonieta Pert, MD  furosemide (LASIX) 80 MG tablet Take 1 tablet (80 mg total) by mouth daily. Patient not taking: No sig reported 06/09/20   Geradine Girt, DO  hydrALAZINE (APRESOLINE) 50 MG tablet Take 2 tablets (100 mg total) by mouth 3 (three) times daily. 06/09/20   Geradine Girt, DO  insulin  glargine (LANTUS SOLOSTAR) 100 UNIT/ML Solostar Pen Inject 10 Units into the skin every morning. Patient taking differently: Inject 16 Units into the skin every morning. 06/09/20   Geradine Girt, DO  insulin lispro (HUMALOG KWIKPEN) 100 UNIT/ML KwikPen Inject 2 Units into the skin 3 (three) times daily with meals. 04/25/20   Renato Shin, MD  latanoprost (XALATAN) 0.005 % ophthalmic solution Place 1 drop into the left eye at bedtime. Patient not taking: No sig reported 06/09/20   Geradine Girt, DO  losartan (COZAAR) 50 MG tablet Take 2 tablets (100 mg total) by mouth daily. Patient not taking: No sig reported 06/09/20   Geradine Girt, DO  metoCLOPramide (REGLAN) 5 MG tablet Take 5 mg by mouth daily as needed for nausea or vomiting. 07/01/20   [provider]  multivitamin (RENA-VIT) TABS tablet Take 1 tablet by mouth at bedtime. 03/08/20   Mercy Riding, MD  ondansetron (ZOFRAN ODT) 4 MG disintegrating tablet Take 1 tablet (4 mg total) by mouth every 8 (eight) hours as needed for nausea or vomiting. 08/02/20   Deno Etienne, DO  pantoprazole (PROTONIX) 40 MG tablet Take 1 tablet (40 mg total) by mouth daily. 01/31/20   Ladene Artist, MD  promethazine (PHENERGAN) 25 MG suppository Place 1 suppository (25 mg total) rectally every 6 (six) hours as needed for nausea or vomiting. 08/02/20   Deno Etienne, DO    Allergies    Patient has no known allergies.  Review of Systems   Review of Systems  Constitutional:  Negative for chills and fever.  Respiratory:  Negative for shortness of breath.   Cardiovascular:  Negative for chest pain.  Gastrointestinal:  Positive for abdominal pain, nausea and vomiting. Negative for blood in stool, constipation and diarrhea.  Genitourinary:  Negative for dysuria.  Musculoskeletal:  Negative for arthralgias and myalgias.  Skin:  Negative for rash and wound.  Neurological:  Negative for weakness.  Psychiatric/Behavioral:  Negative for confusion.  All other  systems reviewed and are negative.  Physical Exam Updated Vital Signs BP (!) 171/97   Pulse (!) 109   Temp 98.4 F (36.9 C) (Oral)   Resp 16   SpO2 99%   Physical Exam Vitals and nursing note reviewed.  Constitutional:      General: She is not in acute distress.    Appearance: She is well-developed. She is not diaphoretic.  HENT:     Head: Normocephalic and atraumatic.  Cardiovascular:     Rate and Rhythm: Normal rate and regular rhythm.     Heart sounds: Normal heart sounds.  Pulmonary:     Effort: Pulmonary effort is normal.     Breath sounds: Normal breath sounds.  Abdominal:     Palpations: Abdomen is soft.     Tenderness: There is no abdominal tenderness.  Skin:    General: Skin is warm and dry.     Findings: No erythema or rash.  Neurological:     Mental Status: She is alert and oriented to person, place, and time.  Psychiatric:        Behavior: Behavior normal.    ED Results / Procedures / Treatments   Labs (all labs ordered are listed, but only abnormal results are displayed) Labs Reviewed  CBC WITH DIFFERENTIAL/PLATELET - Abnormal; Notable for the following components:      Result Value   RDW 17.3 (*)    All other components within normal limits  COMPREHENSIVE METABOLIC PANEL - Abnormal; Notable for the following components:   Chloride 97 (*)    Glucose, Bld 253 (*)    Creatinine, Ser 4.73 (*)    Albumin 3.2 (*)    GFR, Estimated 12 (*)    All other components within normal limits  LIPASE, BLOOD  URINALYSIS, ROUTINE W REFLEX MICROSCOPIC  I-STAT BETA HCG BLOOD, ED ([facility], [facility], [facility] ONLY)    EKG None  Radiology No results found.  Procedures Procedures   Medications Ordered in ED Medications - No data to display  ED Course  I have reviewed the triage vital signs and the nursing notes.  Pertinent labs & imaging results that were available during my care of the patient were reviewed by me and considered in my medical decision making (see chart for  details).  Clinical Course as of 08/19/20 2228  Tue Aug 20, 7054  [id number] 30 year old female with complaint of recurrent nausea, vomiting, upper abdominal pain.  Symptoms have resolved upon arrival in the ED after receiving Zofran and fentanyl with EMS.  Abdomen is soft and nontender.  Labs without significant baseline including CBC, CMP, lipase and negative quant. Plan is for patient to follow-up with her primary care provider. [LM]    Clinical Course User Index [LM] Roque Lias   MDM Rules/Calculators/A&P                            Final Clinical Impression(s) / ED Diagnoses Final diagnoses:  Generalized abdominal pain  Non-intractable vomiting with nausea, unspecified vomiting type    Rx / DC Orders ED Discharge Orders     None        Tacy Learn, PA-C 08/19/20 2228    Lucrezia Starch, MD 08/20/20 [phone]

## 2020-08-19 NOTE — Discharge Instructions (Addendum)
Thank you for allowing me to care for you today in the Emergency Department.   Call your dialysis center to see if they can get you on the list for dialysis in the morning.  Take your home medication for nausea and vomiting as prescribed if your symptoms return.  Return to the emergency department if you have persistent vomiting that is not resolved by your home medications, severe abdominal pain with fever, or other new, concerning symptoms.

## 2020-08-19 NOTE — ED Triage Notes (Signed)
Pt arrived via [facility] from home. Per EMS, pt c/o abd pain with N/V since yesterday. EMS reports pt had multiple episodes of dry heaving while in their care so they administered 4 mg zofran and 50 mcg fentanyl for pain which provided some relief. Pt reports she has been to ED multiple times for same complaint and has followed up with GI doctor. Last episode of same complaints approx. 2 weeks ago. Pt does dialysis Mon/Tues/Thurs/Fri and reports that she has been compliant but had to end her dialysis with an hour left to go due to s/s.EMS advised pt has been tachycardic and hypertensive in their care.

## 2020-08-20 ENCOUNTER — Other Ambulatory Visit: Payer: Self-pay

## 2020-08-20 MED ORDER — SODIUM CHLORIDE 0.9 % IV BOLUS
500.0000 mL | Freq: Once | INTRAVENOUS | Status: AC
  Administered 2020-08-20: 500 mL via INTRAVENOUS

## 2020-08-20 NOTE — ED Provider Notes (Signed)
30 year old female received in signout from [place] pending fluid challenge and reevaluation of vital signs.  Per her HPI:  "30 year old female brought in by EMS from home with upper abdominal pain with nausea and vomiting.  Patient was given fentanyl and Zofran by EMS with resolution of her symptoms.  Patient tried Zofran, Reglan, Phenergan suppositories at home without relief.  Symptoms are recurrent in nature, unchanged from prior episodes.  Attends dialysis Monday, Tuesday, Thursday, Friday and hopes to transition to in-home dialysis.  Patient did in dialysis with 1 hour remaining today due to feeling unwell."  Physical Exam  BP 131/85 (BP Location: Left Arm)   Pulse (!) 109   Temp 98.2 F (36.8 C) (Oral)   Resp 17   SpO2 99%   Physical Exam Vitals and nursing note reviewed.  Constitutional:      Appearance: She is well-developed.     Comments: NAD.  HENT:     Head: Normocephalic and atraumatic.  Cardiovascular:     Comments: Minimal tachycardia in the low 100s. Pulmonary:     Effort: Pulmonary effort is normal.  Abdominal:     General: There is no distension.  Musculoskeletal:        General: Normal range of motion.     Cervical back: Normal range of motion.  Neurological:     Mental Status: She is alert and oriented to person, place, and time.    ED Course/Procedures   Clinical Course as of 08/20/20 0518  Tue Aug 19, 7541  [id number] 30 year old female with complaint of recurrent nausea, vomiting, upper abdominal pain.  Symptoms have resolved upon arrival in the ED after receiving Zofran and fentanyl with EMS.  Abdomen is soft and nontender.  Labs without significant baseline including CBC, CMP, lipase and negative quant. Plan is for patient to follow-up with her primary care provider. [LM]    Clinical Course User Index [LM] Roque Lias    Procedures  MDM  30 year old female received from [facility] pending fluid challenge and reevaluation of vital signs.   Please see her note for further work-up and medical decision making.  In brief, this is a 30 year old female with recurrent nausea, vomiting, upper abdominal pain.  Vomiting had resolved after she was given Zofran and fentanyl with EMS.  Tachycardiac in the 130s on arrival.  She only completed 1 hour of dialysis earlier today.  Creatinine is 4.73.  However, this is improved from previous.  No metabolic derangements.  Glucose is elevated, but no evidence of DKA or HHS with normal bicarb and anion gap.  After signout, patient was requesting IV fluid bolus.  After 500 cc, tachycardia significantly downtrending.  She has been tolerating fluids without difficulty.  She is feeling much better and is ready for discharge.  Encourage patient to call her dialysis center to reschedule dialysis tomorrow since she is on a Monday, Tuesday, Thursday, Friday schedule.  She has been encouraged to follow-up with GI.  She has antiemetics at home.  At this time, she is hemodynamically stable and in no acute distress.  Safe for discharged home with outpatient follow-up as discussed.  ER return precautions given peer       Joanne Gavel, PA-C 08/20/20 0518    Palumbo, April, MD 08/20/20 ER:[id number]

## 2020-08-25 ENCOUNTER — Ambulatory Visit (INDEPENDENT_AMBULATORY_CARE_PROVIDER_SITE_OTHER): Payer: 59 | Admitting: Surgery

## 2020-08-25 ENCOUNTER — Other Ambulatory Visit: Payer: Self-pay

## 2020-08-25 ENCOUNTER — Encounter: Payer: Self-pay | Admitting: Surgery

## 2020-08-25 VITALS — BP 188/106 | HR 107 | Temp 98.0°F | Resp 20 | Ht 66.0 in | Wt 137.0 lb

## 2020-08-25 DIAGNOSIS — Z992 Dependence on renal dialysis: Secondary | ICD-10-CM

## 2020-08-25 DIAGNOSIS — N186 End stage renal disease: Secondary | ICD-10-CM | POA: Diagnosis not present

## 2020-08-25 NOTE — Progress Notes (Signed)
[facility] [place]  Patient name: Barbara Donovan MRN: [id number] DOB: 1990-03-02 Sex: female   REQUESTING PROVIDER:    Dr.  Joylene Grapes   REASON FOR CONSULT:    PD  catheter  HISTORY OF PRESENT ILLNESS:   Barbara Donovan is a 30 y.o. female, who is referred for evaluation of a peritoneal dialysis catheter.  Her renal failure is secondary to diabetes and hypertension.  She currently is on hemodialysis via a right sided catheter on Monday Tuesday, Thursday Friday.  She has been having issues with nausea and vomiting and is getting ready to undergo a GI work-up.  Her only abdominal surgery is a C-section.  PAST MEDICAL HISTORY    Past Medical History:  Diagnosis Date   Asthma    as a child, no problems as an adult, no inhaler   Cataract    NS OU   Chronic hypertension during pregnancy, antepartum 08/19/2017   Dehydration 01/28/2018   Depression during pregnancy, antepartum 07/07/2017   6/20: Short trial of zoloft previously, reports didn't help much but also didn't give it a chance Discussed r/b/a SSRIs in pregnancy, agrees to try Zoloft again, rx sent No SI/HI/red flags   Diabetes ([place])    TYPE I. A1C 7.5% 05/31/20   Diabetic retinopathy ([place]) 06/09/2017   07/2017 with bilateral severe diabetic non-proliferative retinopathy with macular edema.   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 A2/B GDM)  Required Referrals for A1GDM or  A2GDM: '[x]'$  Diabetes Education and Testing Supplies '[x]'$  Nutrition Cousult  For A2/B GDM or higher classes of DM '[x]'$  Diabetes Education and Testing Supplies '[x]'$  Nutrition Counsult '[x]'$  Fetal ECHO after 20 weeks  '[x]'$  Eye exam for retina evaluation - severe retinopathy 7/19  Base   Ventricular septal defect (VSD) of fetus in singleton pregnancy, antepartum 09/30/2017   May go to newborn nursery per Dr. Lenard Simmer Echo prior to discharge     FAMILY HISTORY   Family History  Problem Relation Age of Onset   Diabetes Mother    Aneurysm Mother 13   Seizures Mother    Diabetes Father    Cataracts Father    COPD Father    Heart attack Father    Heart disease Father    Healthy Sister    Healthy Daughter    Stroke Maternal Grandfather    Amblyopia Neg Hx    Blindness Neg Hx    Glaucoma Neg Hx    Macular degeneration Neg Hx    Retinal detachment Neg Hx    Strabismus Neg Hx    Retinitis pigmentosa Neg Hx    Colon cancer Neg Hx    Stomach cancer Neg Hx    Esophageal cancer Neg Hx    Pancreatic cancer Neg Hx    Liver disease Neg Hx     SOCIAL HISTORY:   Social History   Socioeconomic History   Marital status: Significant Other    Spouse name: Not on  file   Number of children: Not on file   Years of education: Not on file   Highest education level: Not on file  Occupational History   Not on file  Tobacco Use   Smoking status: Never   Smokeless tobacco: Never  Vaping Use   Vaping Use: Never used  Substance and Sexual Activity   Alcohol use: Not Currently    Comment: SOCIALLY   Drug use: Never   Sexual activity: Not Currently    Birth control/protection: Pill  Other Topics Concern   Not on file  Social History Narrative   Not on file   Social Determinants of Health   Financial Resource Strain: Not on file  Food Insecurity: Not on file  Transportation Needs: Not on file  Physical Activity: Not on file  Stress: Not on file  Social Connections: Not on file  Intimate Partner  Violence: Not on file    ALLERGIES:    No Known Allergies  CURRENT MEDICATIONS:    Current Outpatient Medications  Medication Sig Dispense Refill   amLODipine (NORVASC) 10 MG tablet Take 1 tablet (10 mg total) by mouth daily. 90 tablet 1   carvedilol (COREG) 25 MG tablet Take 1 tablet (25 mg total) by mouth 2 (two) times daily. 180 tablet 3   Continuous Blood Gluc Sensor MISC 1 each by Does not apply route as directed. Use as directed every 14 days. May dispense FreeStyle [employer] or similar. 1 each 0   hydrALAZINE (APRESOLINE) 50 MG tablet Take 2 tablets (100 mg total) by mouth 3 (three) times daily. 180 tablet 0   insulin glargine (LANTUS SOLOSTAR) 100 UNIT/ML Solostar Pen Inject 10 Units into the skin every morning. (Patient taking differently: Inject 16 Units into the skin every morning.) 15 mL 3   insulin lispro (HUMALOG KWIKPEN) 100 UNIT/ML KwikPen Inject 2 Units into the skin 3 (three) times daily with meals. 15 mL 3   losartan (COZAAR) 50 MG tablet Take 2 tablets (100 mg total) by mouth daily. 60 tablet 0   metoCLOPramide (REGLAN) 5 MG tablet Take 5 mg by mouth daily as needed for nausea or vomiting.     multivitamin (RENA-VIT) TABS tablet Take 1 tablet by mouth at bedtime. 90 tablet 1   ondansetron (ZOFRAN ODT) 4 MG disintegrating tablet Take 1 tablet (4 mg total) by mouth every 8 (eight) hours as needed for nausea or vomiting. 20 tablet 0   pantoprazole (PROTONIX) 40 MG tablet Take 1 tablet (40 mg total) by mouth daily. 90 tablet 3   promethazine (PHENERGAN) 25 MG suppository Place 1 suppository (25 mg total) rectally every 6 (six) hours as needed for nausea or vomiting. 12 each 0   furosemide (LASIX) 80 MG tablet Take 1 tablet (80 mg total) by mouth daily. (Patient not taking: No sig reported) 30 tablet 0   latanoprost (XALATAN) 0.005 % ophthalmic solution Place 1 drop into the left eye at bedtime. (Patient not taking: No sig reported) 2.5 mL 6   No current  facility-administered medications for this visit.    REVIEW OF SYSTEMS:   '[X]'$  denotes positive finding, '[ ]'$  denotes negative finding Cardiac  Comments:  Chest pain or chest pressure:    Shortness of breath upon exertion:    Short of breath when lying flat:    Irregular heart rhythm:        Vascular    Pain in calf, thigh, or hip brought on by ambulation:    Pain  in feet at night that wakes you up from your sleep:     Blood clot in your veins:    Leg swelling:         Pulmonary    Oxygen at home:    Productive cough:     Wheezing:         Neurologic    Sudden weakness in arms or legs:     Sudden numbness in arms or legs:     Sudden onset of difficulty speaking or slurred speech:    Temporary loss of vision in one eye:     Problems with dizziness:         Gastrointestinal    Blood in stool:      Vomited blood:         Genitourinary    Burning when urinating:     Blood in urine:        Psychiatric    Major depression:         Hematologic    Bleeding problems:    Problems with blood clotting too easily:        Skin    Rashes or ulcers:        Constitutional    Fever or chills:     PHYSICAL EXAM:   Vitals:   08/25/20 1030  BP: (!) 188/106  Pulse: (!) 107  Resp: 20  Temp: 98 F (36.7 C)  SpO2: 98%  Weight: 137 lb (62.1 kg)  Height: '5\' 6"'$  (1.676 m)    GENERAL: The patient is a well-nourished female, in no acute distress. The vital signs are documented above. CARDIAC: There is a regular rate and rhythm.  PULMONARY: Nonlabored respirations ABDOMEN: Soft and non-tender  MUSCULOSKELETAL: There are no major deformities or cyanosis. NEUROLOGIC: No focal weakness or paresthesias are detected. SKIN: There are no ulcers or rashes noted. PSYCHIATRIC: The patient has a normal affect.  STUDIES:   None  ASSESSMENT and PLAN   End-stage renal disease: The patient wishes to pursue peritoneal dialysis.  We discussed the details of placing a peritoneal catheter  as well as the risk of infection and possible removal.  She is having issues with nausea and vomiting.  She has been referred for a GI work-up.  I will delay placing her peritoneal catheter until she has been cleared from GI.   Leia Alf, MD, FACS [facility] [phone] Pager [phone]

## 2020-08-25 NOTE — H&P (View-Only) (Signed)
Vascular and Vein Specialist of [place]  Patient name: Barbara Donovan MRN: [id number] DOB: 1990/05/23 Sex: female   REQUESTING PROVIDER:    Dr.  Joylene Grapes   REASON FOR CONSULT:    PD  catheter  HISTORY OF PRESENT ILLNESS:   Barbara Donovan is a 30 y.o. female, who is referred for evaluation of a peritoneal dialysis catheter.  Her renal failure is secondary to diabetes and hypertension.  She currently is on hemodialysis via a right sided catheter on Monday Tuesday, Thursday Friday.  She has been having issues with nausea and vomiting and is getting ready to undergo a GI work-up.  Her only abdominal surgery is a C-section.  PAST MEDICAL HISTORY    Past Medical History:  Diagnosis Date   Asthma    as a child, no problems as an adult, no inhaler   Cataract    NS OU   Chronic hypertension during pregnancy, antepartum 08/19/2017   Dehydration 01/28/2018   Depression during pregnancy, antepartum 07/07/2017   6/20: Short trial of zoloft previously, reports didn't help much but also didn't give it a chance Discussed r/b/a SSRIs in pregnancy, agrees to try Zoloft again, rx sent No SI/HI/red flags   Diabetes ([place])    TYPE I. A1C 7.5% 05/31/20   Diabetic retinopathy ([place]) 06/09/2017   07/2017 with bilateral severe diabetic non-proliferative retinopathy with macular edema.   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 A2/B GDM)  Required Referrals for A1GDM or  A2GDM: '[x]'$  Diabetes Education and Testing Supplies '[x]'$  Nutrition Cousult  For A2/B GDM or higher classes of DM '[x]'$  Diabetes Education and Testing Supplies '[x]'$  Nutrition Counsult '[x]'$  Fetal ECHO after 20 weeks  '[x]'$  Eye exam for retina evaluation - severe retinopathy 7/19  Base   Ventricular septal defect (VSD) of fetus in singleton pregnancy, antepartum 09/30/2017   May go to newborn nursery per Dr. Lenard Simmer Echo prior to discharge     FAMILY HISTORY   Family History  Problem Relation Age of Onset   Diabetes Mother    Aneurysm Mother 49   Seizures Mother    Diabetes Father    Cataracts Father    COPD Father    Heart attack Father    Heart disease Father    Healthy Sister    Healthy Daughter    Stroke Maternal Grandfather    Amblyopia Neg Hx    Blindness Neg Hx    Glaucoma Neg Hx    Macular degeneration Neg Hx    Retinal detachment Neg Hx    Strabismus Neg Hx    Retinitis pigmentosa Neg Hx    Colon cancer Neg Hx    Stomach cancer Neg Hx    Esophageal cancer Neg Hx    Pancreatic cancer Neg Hx    Liver disease Neg Hx     SOCIAL HISTORY:   Social History   Socioeconomic History   Marital status: Significant Other    Spouse name: Not on  file   Number of children: Not on file   Years of education: Not on file   Highest education level: Not on file  Occupational History   Not on file  Tobacco Use   Smoking status: Never   Smokeless tobacco: Never  Vaping Use   Vaping Use: Never used  Substance and Sexual Activity   Alcohol use: Not Currently    Comment: SOCIALLY   Drug use: Never   Sexual activity: Not Currently    Birth control/protection: Pill  Other Topics Concern   Not on file  Social History Narrative   Not on file   Social Determinants of Health   Financial Resource Strain: Not on file  Food Insecurity: Not on file  Transportation Needs: Not on file  Physical Activity: Not on file  Stress: Not on file  Social Connections: Not on file  Intimate Partner  Violence: Not on file    ALLERGIES:    No Known Allergies  CURRENT MEDICATIONS:    Current Outpatient Medications  Medication Sig Dispense Refill   amLODipine (NORVASC) 10 MG tablet Take 1 tablet (10 mg total) by mouth daily. 90 tablet 1   carvedilol (COREG) 25 MG tablet Take 1 tablet (25 mg total) by mouth 2 (two) times daily. 180 tablet 3   Continuous Blood Gluc Sensor MISC 1 each by Does not apply route as directed. Use as directed every 14 days. May dispense FreeStyle [employer] or similar. 1 each 0   hydrALAZINE (APRESOLINE) 50 MG tablet Take 2 tablets (100 mg total) by mouth 3 (three) times daily. 180 tablet 0   insulin glargine (LANTUS SOLOSTAR) 100 UNIT/ML Solostar Pen Inject 10 Units into the skin every morning. (Patient taking differently: Inject 16 Units into the skin every morning.) 15 mL 3   insulin lispro (HUMALOG KWIKPEN) 100 UNIT/ML KwikPen Inject 2 Units into the skin 3 (three) times daily with meals. 15 mL 3   losartan (COZAAR) 50 MG tablet Take 2 tablets (100 mg total) by mouth daily. 60 tablet 0   metoCLOPramide (REGLAN) 5 MG tablet Take 5 mg by mouth daily as needed for nausea or vomiting.     multivitamin (RENA-VIT) TABS tablet Take 1 tablet by mouth at bedtime. 90 tablet 1   ondansetron (ZOFRAN ODT) 4 MG disintegrating tablet Take 1 tablet (4 mg total) by mouth every 8 (eight) hours as needed for nausea or vomiting. 20 tablet 0   pantoprazole (PROTONIX) 40 MG tablet Take 1 tablet (40 mg total) by mouth daily. 90 tablet 3   promethazine (PHENERGAN) 25 MG suppository Place 1 suppository (25 mg total) rectally every 6 (six) hours as needed for nausea or vomiting. 12 each 0   furosemide (LASIX) 80 MG tablet Take 1 tablet (80 mg total) by mouth daily. (Patient not taking: No sig reported) 30 tablet 0   latanoprost (XALATAN) 0.005 % ophthalmic solution Place 1 drop into the left eye at bedtime. (Patient not taking: No sig reported) 2.5 mL 6   No current  facility-administered medications for this visit.    REVIEW OF SYSTEMS:   '[X]'$  denotes positive finding, '[ ]'$  denotes negative finding Cardiac  Comments:  Chest pain or chest pressure:    Shortness of breath upon exertion:    Short of breath when lying flat:    Irregular heart rhythm:        Vascular    Pain in calf, thigh, or hip brought on by ambulation:    Pain  in feet at night that wakes you up from your sleep:     Blood clot in your veins:    Leg swelling:         Pulmonary    Oxygen at home:    Productive cough:     Wheezing:         Neurologic    Sudden weakness in arms or legs:     Sudden numbness in arms or legs:     Sudden onset of difficulty speaking or slurred speech:    Temporary loss of vision in one eye:     Problems with dizziness:         Gastrointestinal    Blood in stool:      Vomited blood:         Genitourinary    Burning when urinating:     Blood in urine:        Psychiatric    Major depression:         Hematologic    Bleeding problems:    Problems with blood clotting too easily:        Skin    Rashes or ulcers:        Constitutional    Fever or chills:     PHYSICAL EXAM:   Vitals:   08/25/20 1030  BP: (!) 188/106  Pulse: (!) 107  Resp: 20  Temp: 98 F (36.7 C)  SpO2: 98%  Weight: 137 lb (62.1 kg)  Height: '5\' 6"'$  (1.676 m)    GENERAL: The patient is a well-nourished female, in no acute distress. The vital signs are documented above. CARDIAC: There is a regular rate and rhythm.  PULMONARY: Nonlabored respirations ABDOMEN: Soft and non-tender  MUSCULOSKELETAL: There are no major deformities or cyanosis. NEUROLOGIC: No focal weakness or paresthesias are detected. SKIN: There are no ulcers or rashes noted. PSYCHIATRIC: The patient has a normal affect.  STUDIES:   None  ASSESSMENT and PLAN   End-stage renal disease: The patient wishes to pursue peritoneal dialysis.  We discussed the details of placing a peritoneal catheter  as well as the risk of infection and possible removal.  She is having issues with nausea and vomiting.  She has been referred for a GI work-up.  I will delay placing her peritoneal catheter until she has been cleared from GI.   Leia Alf, MD, FACS [facility] [phone] Pager [phone]

## 2020-08-28 ENCOUNTER — Ambulatory Visit (INDEPENDENT_AMBULATORY_CARE_PROVIDER_SITE_OTHER): Payer: 59 | Admitting: Physician Assistant

## 2020-08-28 ENCOUNTER — Encounter: Payer: Self-pay | Admitting: Physician Assistant

## 2020-08-28 VITALS — BP 146/94 | HR 88 | Ht 66.0 in | Wt 133.0 lb

## 2020-08-28 DIAGNOSIS — R1013 Epigastric pain: Secondary | ICD-10-CM | POA: Diagnosis not present

## 2020-08-28 DIAGNOSIS — E1022 Type 1 diabetes mellitus with diabetic chronic kidney disease: Secondary | ICD-10-CM

## 2020-08-28 DIAGNOSIS — R112 Nausea with vomiting, unspecified: Secondary | ICD-10-CM | POA: Diagnosis not present

## 2020-08-28 DIAGNOSIS — G8929 Other chronic pain: Secondary | ICD-10-CM | POA: Diagnosis not present

## 2020-08-28 DIAGNOSIS — E1065 Type 1 diabetes mellitus with hyperglycemia: Secondary | ICD-10-CM

## 2020-08-28 DIAGNOSIS — IMO0002 Reserved for concepts with insufficient information to code with codable children: Secondary | ICD-10-CM

## 2020-08-28 DIAGNOSIS — N185 Chronic kidney disease, stage 5: Secondary | ICD-10-CM

## 2020-08-28 MED ORDER — METOCLOPRAMIDE HCL 5 MG PO TABS: 5.0000 mg | ORAL_TABLET | Freq: Three times a day (TID) | ORAL | 3 refills | Status: DC

## 2020-08-28 NOTE — Patient Instructions (Addendum)
We have sent the following medications to your pharmacy for you to pick up at your convenience: Reglan 5 mg four times daily 20-30 minutes before meals and at bedtime.  If you are age 30 or older, your body mass index should be between 23-30. Your Body mass index is 21.47 kg/m. If this is out of the aforementioned range listed, please consider follow up with your Primary Care Provider.  If you are age 32 or younger, your body mass index should be between 19-25. Your Body mass index is 21.47 kg/m. If this is out of the aformentioned range listed, please consider follow up with your Primary Care Provider.   __________________________________________________________  The [place] [facility] providers would like to encourage you to use [facility] to communicate with providers for non-urgent requests or questions.  Due to long hold times on the telephone, sending your provider a message by [facility] may be a faster and more efficient way to get a response.  Please allow 48 business hours for a response.  Please remember that this is for non-urgent requests.

## 2020-08-28 NOTE — Progress Notes (Signed)
Recurrent nausea, vomiting could be due to poorly controlled diabetes.  Normal gastric emptying scan in April 2022 excludes gastroparesis.  Extensive GI evaluation unremarkable including recent RUQ [name], CT AP, EGD and gastric emptying scan.  Optimize diabetic control.  If improved control of diabetes does not improve her N/V recommend GI evaluation at a tertiary center.   Pricilla Riffle. Fuller Plan, MD [facility]

## 2020-08-28 NOTE — Progress Notes (Signed)
Chief Complaint: Nausea and vomiting  HPI:    Barbara Donovan is a 30 year old African-American female with a past medical history as listed below including ESRD on HD, known to Dr. Fuller Plan, who was referred to me by Fanny Bien, MD for a complaint of nausea and vomiting.    11/28/2019 office visit with Dr. Fuller Plan.  At that time discussed she had longstanding type 1 diabetes.  She had occasional mild left upper quadrant pain and been seen in the ED for refractory nausea and vomiting as well as diabetic ketoacidosis.  She was set up for EGD.  Also scheduled for abdominal ultrasound.  Refilled Phenergan 25 mg p.o. every 6 hours.    12/07/2019 right upper quadrant ultrasound was negative.    01/19/2020 CT of the abdomen pelvis without contrast showed no acute abnormality.    01/31/2020 EGD with Dr. Fuller Plan with normal esophagus, focally erythematous mucosa in the gastric fundus, normal duodenal bulb and second portion of the duodenum.  Pathology negative for H. Pylori, patient started on Pantoprazole 40 mg daily.    04/18/2020 gastric emptying study was normal.    06/01/2020 abdominal x-ray with no evidence for acute abnormality.    Per review of chart it appears patient has been admitted numerous times for gastroparesis since then (11 that I can count here at [facility]).    Most recently patient seen in the ED 08/19/2020 after being brought by the EMS from home with upper abdominal pain, nausea and vomiting.  She was given fentanyl and Zofran by the EMS with resolution of her symptoms.  She had tried Zofran, Reglan, Phenergan suppositories at home without relief.    08/25/2020 patient seen in clinic by the vascular surgeons for discussion of peritoneal dialysis catheter.  Apparently they delayed placement of the catheter until she had "GI work-up" and was cleared from our perspective for her nausea and vomiting.    Today, the patient describes that she continues with nausea and vomiting, typically this will happen at  least 1-2 times a week and she will just run through the medication she has at home including Reglan, Promethazine suppositories, Ativan, Zofran and Compazine until she can get symptoms under control.  Sometimes though this does not work and she has to go to the ED as above.  Patient is frustrated with ongoing symptoms and wants to know if there is anything else she can do.  She did try scheduling her Reglan but only took 5 mg once daily back in May, she felt like this helped for a little while but then ended up back in the hospital.  Associated symptoms include epigastric abdominal pain with episodes of nausea and vomiting.    Describes she is currently undergoing evaluation for possible peritoneal dialysis, they are wanting better control of her nausea and vomiting before going through with this.    Denies fever, chills, weight loss or blood in her stool.  Past Medical History:  Diagnosis Date   Asthma    as a child, no problems as an adult, no inhaler   Cataract    NS OU   Chronic hypertension during pregnancy, antepartum 08/19/2017   Dehydration 01/28/2018   Depression during pregnancy, antepartum 07/07/2017   6/20: Short trial of zoloft previously, reports didn't help much but also didn't give it a chance Discussed r/b/a SSRIs in pregnancy, agrees to try Zoloft again, rx sent No SI/HI/red flags   Diabetes ([place])    TYPE I. A1C 7.5% 05/31/20  Diabetic retinopathy ([place]) 06/09/2017   07/2017 with bilateral severe diabetic non-proliferative retinopathy with macular edema.   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 A2/B GDM)  Required Referrals for A1GDM or A2GDM: '[x]'$  Diabetes Education and Testing Supplies '[x]'$  Nutrition Cousult  For A2/B GDM or higher classes of DM '[x]'$  Diabetes Education and Testing Supplies '[x]'$  Nutrition Counsult '[x]'$  Fetal ECHO after 20 weeks  '[x]'$  Eye exam for retina evaluation - severe retinopathy 7/19  Base   Ventricular septal defect (VSD) of fetus in singleton pregnancy, antepartum 09/30/2017   May go to newborn nursery per Dr. Lenard Simmer Echo prior to discharge    Past Surgical History:  Procedure Laterality Date   25 GAUGE PARS PLANA VITRECTOMY WITH 20 GAUGE MVR PORT FOR MACULAR HOLE Left 07/20/2018   Procedure: 25 GAUGE PARS PLANA VITRECTOMY LEFT EYE;  Surgeon: Bernarda Caffey, MD;  Location: [place];  Service: Ophthalmology;  Laterality: Left;   CESAREAN SECTION N/A 12/26/2017   Procedure: CESAREAN SECTION;  Surgeon: Osborne Oman, MD;  Location: [place];  Service: Obstetrics;  Laterality: N/A;   EYE EXAMINATION UNDER ANESTHESIA Right 07/20/2018   Procedure: Eye Exam Under Anesthesia RIGHT EYE;  Surgeon: Bernarda Caffey, MD;  Location: [place];  Service: Ophthalmology;  Laterality: Right;   EYE SURGERY Left 07/2018   GAS INSERTION Left 07/19/2019   Procedure: INSERTION OF GAS;  Surgeon: Bernarda Caffey, MD;  Location: [place];  Service: Ophthalmology;  Laterality: Left;  SF6   INJECTION OF SILICONE OIL Left 99991111   Procedure: Injection Of Silicone Oil LEFT EYE;  Surgeon: Bernarda Caffey, MD;  Location: [place];  Service: Ophthalmology;  Laterality: Left;   IR FLUORO GUIDE CV LINE RIGHT  06/02/2020   IR US GUIDE VASC ACCESS RIGHT  06/02/2020   LASER PHOTO ABLATION Right 07/20/2018   Procedure: Laser Photo Ablation RIGHT EYE;  Surgeon: Bernarda Caffey, MD;  Location: [place];  Service: Ophthalmology;  Laterality: Right;   MEMBRANE PEEL Left 07/20/2018   Procedure: Membrane Peel LEFT  EYE;  Surgeon: Bernarda Caffey, MD;  Location: [place];  Service: Ophthalmology;  Laterality: Left;   MEMBRANE PEEL Left 07/19/2019   Procedure: MEMBRANE PEEL;  Surgeon: Bernarda Caffey, MD;  Location: [place];  Service: Ophthalmology;  Laterality: Left;   MITOMYCIN C APPLICATION Bilateral 99991111   Procedure: Avastin Application;  Surgeon: Bernarda Caffey, MD;  Location: [place];  Service: Ophthalmology;  Laterality: Bilateral;   PHOTOCOAGULATION WITH LASER Left 07/20/2018   Procedure: Photocoagulation With Laser LEFT EYE;  Surgeon: Bernarda Caffey, MD;  Location: [place];  Service: Ophthalmology;  Laterality: Left;   PHOTOCOAGULATION WITH LASER Left 07/19/2019   Procedure: PHOTOCOAGULATION WITH LASER;  Surgeon: Bernarda Caffey, MD;  Location: [place];  Service: Ophthalmology;  Laterality: Left;   RETINAL DETACHMENT SURGERY Left 07/20/2018   Dr. Bernarda Caffey   SILICON OIL REMOVAL Left 07/19/2019   Procedure: 25g PARS PLANA VITRECTOMY WITH SILICON OIL REMOVAL;  Surgeon: Coralyn Pear,  Aaron Edelman, MD;  Location: [place];  Service: Ophthalmology;  Laterality: Left;   WISDOM TOOTH EXTRACTION      Current Outpatient Medications  Medication Sig Dispense Refill   amLODipine (NORVASC) 10 MG tablet Take 1 tablet (10 mg total) by mouth daily. 90 tablet 1   carvedilol (COREG) 25 MG tablet Take 1 tablet (25 mg total) by mouth 2 (two) times daily. 180 tablet 3   Continuous Blood Gluc Sensor MISC 1 each by Does not apply route as directed. Use as directed every 14 days. May dispense FreeStyle [employer] or similar. 1 each 0   furosemide (LASIX) 80 MG tablet Take 1 tablet (80 mg total) by mouth daily. (Patient not taking: No sig reported) 30 tablet 0   hydrALAZINE (APRESOLINE) 50 MG tablet Take 2 tablets (100 mg total) by mouth 3 (three) times daily. 180 tablet 0   insulin glargine (LANTUS SOLOSTAR) 100 UNIT/ML Solostar Pen Inject 10 Units into the skin every morning. (Patient taking differently: Inject 16 Units into the skin every  morning.) 15 mL 3   insulin lispro (HUMALOG KWIKPEN) 100 UNIT/ML KwikPen Inject 2 Units into the skin 3 (three) times daily with meals. 15 mL 3   latanoprost (XALATAN) 0.005 % ophthalmic solution Place 1 drop into the left eye at bedtime. (Patient not taking: No sig reported) 2.5 mL 6   losartan (COZAAR) 50 MG tablet Take 2 tablets (100 mg total) by mouth daily. 60 tablet 0   metoCLOPramide (REGLAN) 5 MG tablet Take 5 mg by mouth daily as needed for nausea or vomiting.     multivitamin (RENA-VIT) TABS tablet Take 1 tablet by mouth at bedtime. 90 tablet 1   ondansetron (ZOFRAN ODT) 4 MG disintegrating tablet Take 1 tablet (4 mg total) by mouth every 8 (eight) hours as needed for nausea or vomiting. 20 tablet 0   pantoprazole (PROTONIX) 40 MG tablet Take 1 tablet (40 mg total) by mouth daily. 90 tablet 3   promethazine (PHENERGAN) 25 MG suppository Place 1 suppository (25 mg total) rectally every 6 (six) hours as needed for nausea or vomiting. 12 each 0   No current facility-administered medications for this visit.    Allergies as of 08/28/2020   (No Known Allergies)    Family History  Problem Relation Age of Onset   Diabetes Mother    Aneurysm Mother 71   Seizures Mother    Diabetes Father    Cataracts Father    COPD Father    Heart attack Father    Heart disease Father    Healthy Sister    Healthy Daughter    Stroke Maternal Grandfather    Amblyopia Neg Hx    Blindness Neg Hx    Glaucoma Neg Hx    Macular degeneration Neg Hx    Retinal detachment Neg Hx    Strabismus Neg Hx    Retinitis pigmentosa Neg Hx    Colon cancer Neg Hx    Stomach cancer Neg Hx    Esophageal cancer Neg Hx    Pancreatic cancer Neg Hx    Liver disease Neg Hx     Social History   Socioeconomic History   Marital status: Significant Other    Spouse name: Not on file   Number of children: Not on file   Years of education: Not on file   Highest education level: Not on file  Occupational History    Not on file  Tobacco Use   Smoking status: Never  Smokeless tobacco: Never  Vaping Use   Vaping Use: Never used  Substance and Sexual Activity   Alcohol use: Not Currently    Comment: SOCIALLY   Drug use: Never   Sexual activity: Not Currently    Birth control/protection: Pill  Other Topics Concern   Not on file  Social History Narrative   Not on file   Social Determinants of Health   Financial Resource Strain: Not on file  Food Insecurity: Not on file  Transportation Needs: Not on file  Physical Activity: Not on file  Stress: Not on file  Social Connections: Not on file  Intimate Partner Violence: Not on file    Review of Systems:    Constitutional: No fever or chills Cardiovascular: No chest pain Respiratory: No SOB  Gastrointestinal: See HPI and otherwise negative   Physical Exam:  Vital signs: BP (!) 146/94   Pulse 88   Ht '5\' 6"'$  (1.676 m)   Wt 133 lb (60.3 kg)   BMI 21.47 kg/m    Constitutional:   Pleasant AA female appears to be in NAD, Well developed, Well nourished, alert and cooperative Respiratory: Respirations even and unlabored. Lungs clear to auscultation bilaterally.   No wheezes, crackles, or rhonchi.  Cardiovascular: Normal S1, S2. No MRG. Regular rate and rhythm. No peripheral edema, cyanosis or pallor.  Gastrointestinal:  Soft, nondistended, nontender. No rebound or guarding. Normal bowel sounds. No appreciable masses or hepatomegaly. Rectal:  Not performed.  Psychiatric: Demonstrates good judgement and reason without abnormal affect or behaviors.  RELEVANT LABS AND IMAGING: CBC    Component Value Date/Time   WBC 9.2 08/19/2020 1934   RBC 4.85 08/19/2020 1934   HGB 13.0 08/19/2020 1934   HGB 9.7 (L) 12/22/2017 1459   HGB 13.0 07/07/2017 0000   HCT 41.4 08/19/2020 1934   HCT 28.5 (L) 12/22/2017 1459   HCT 39 07/07/2017 0000   PLT 311 08/19/2020 1934   PLT 427 12/22/2017 1459   PLT 374 07/07/2017 0000   MCV 85.4 08/19/2020 1934   MCV  82 12/22/2017 1459   MCH 26.8 08/19/2020 1934   MCHC 31.4 08/19/2020 1934   RDW 17.3 (H) 08/19/2020 1934   RDW 13.0 12/22/2017 1459   LYMPHSABS 1.2 08/19/2020 1934   MONOABS 0.4 08/19/2020 1934   EOSABS 0.0 08/19/2020 1934   BASOSABS 0.1 08/19/2020 1934    CMP     Component Value Date/Time   NA 138 08/19/2020 1934   NA 139 04/05/2019 1435   K 3.6 08/19/2020 1934   CL 97 (L) 08/19/2020 1934   CO2 27 08/19/2020 1934   GLUCOSE 253 (H) 08/19/2020 1934   BUN 16 08/19/2020 1934   BUN 31 (H) 04/05/2019 1435   CREATININE 4.73 (H) 08/19/2020 1934   CALCIUM 9.2 08/19/2020 1934   PROT 6.9 08/19/2020 1934   PROT 4.5 (L) 12/22/2017 1459   ALBUMIN 3.2 (L) 08/19/2020 1934   ALBUMIN 2.2 (L) 12/22/2017 1459   AST 16 08/19/2020 1934   ALT 12 08/19/2020 1934   ALKPHOS 60 08/19/2020 1934   BILITOT 1.2 08/19/2020 1934   BILITOT <0.2 12/22/2017 1459   GFRNONAA 12 (L) 08/19/2020 1934   GFRAA 30 (L) 09/15/2019 0358    Assessment: 1.  Recurrent nausea and vomiting: Patient has had multiple evaluations in the ER and imaging studies including ultrasound, CT, EGD, gastric emptying study all of which have been unrevealing but patient is treated like she has gastroparesis given known uncontrolled diabetes, continues with at  least 1-2 episodes a week of uncontrollable nausea and vomiting which sometimes lands her in the ER if at home meds do not work; again likely gastroparesis 2.  Epigastric pain: With above 3.  ESRD on HD: Evaluating for peritoneal catheter but some concerns giving ongoing nausea and vomiting 4.  Uncontrolled diabetes: Contributing to above  Plan: 1.  Discussed with patient that we can trial scheduling out her Reglan 5 mg 4 times daily, 20 to 30 minutes before meals and at bedtime.  Prescribed #120 with 3 refills.  Did discuss possible side effects from this medication.  Told her to discontinue if she notices any of them. 2.  With the chronicity of the symptoms I am not sure that we  will get better control in the long-term and I am not sure what that means for her with possible peritoneal catheter.  Will also discuss further with Dr. Fuller Plan. 3.  Plans to follow-up with the patient in 4 to 6 weeks to see if the Reglan is helping.  Ellouise Newer, PA-C [facility] 08/28/2020, 1:06 PM  Cc: Fanny Bien, MD

## 2020-08-29 ENCOUNTER — Telehealth: Payer: Self-pay

## 2020-08-29 ENCOUNTER — Telehealth: Payer: Self-pay | Admitting: Physician Assistant

## 2020-08-29 ENCOUNTER — Other Ambulatory Visit: Payer: Self-pay

## 2020-08-29 NOTE — Telephone Encounter (Signed)
Did patient get clearance for peritoneal cath? Please advise

## 2020-08-29 NOTE — Telephone Encounter (Signed)
Received a telephone communication from [place] at [facility] that patient's N + V are chronic issues and would not preclude placement of a PD catheter. Patient now scheduled for surgery.

## 2020-08-29 NOTE — Telephone Encounter (Signed)
Vascular Advance Specialist called to get clarification on clearance for the possible peritoneal catheter please call Alex at  [phone] can leave a detailed voicemail.

## 2020-08-29 NOTE — Telephone Encounter (Signed)
Left message for Cristie Hem stating the information below.

## 2020-09-08 ENCOUNTER — Encounter (HOSPITAL_COMMUNITY): Payer: Self-pay | Admitting: Surgery

## 2020-09-08 NOTE — Progress Notes (Signed)
DUE TO COVID-19 ONLY ONE VISITOR IS ALLOWED TO COME WITH YOU AND STAY IN THE WAITING ROOM ONLY DURING PRE OP AND PROCEDURE DAY OF SURGERY.   PCP - Dr Rachell Cipro Cardiologist - Dr Skeet Latch (going to make an appt) Internal Med -  Dr Larae Grooms  Chest x-ray - 06/10/20 (2V) EKG - 08/02/20 Stress Test - n/a ECHO - 12/23/17 Cardiac Cath - n/a  Sleep Study -  n/a CPAP - none  Fasting Blood Sugar - 180s Checks Blood Sugar 4 times a day Type 1 DM  THE MORNING OF SURGERY, take 12 units of Lantus insulin.  If your CBG is greater than 220 mg/dL, you may take  of your sliding scale (correction) dose of insulin.  If your blood sugar is less than 70 mg/dL, you will need to treat for low blood sugar: Treat a low blood sugar (less than 70 mg/dL) with  cup of clear juice (cranberry or apple), 4 glucose tablets, OR glucose gel. Recheck blood sugar in 15 minutes after treatment (to make sure it is greater than 70 mg/dL). If your blood sugar is not greater than 70 mg/dL on recheck, call [phone] for further instructions.  Anesthesia review: Yes  STOP now taking any Aspirin (unless otherwise instructed by your surgeon), Aleve, Naproxen, Ibuprofen, Motrin, Advil, Goody's, BC's, all herbal medications, fish oil, and all vitamins.   Coronavirus Screening Covid test n/a - Ambulatory Surgery  Do you have any of the following symptoms:  Cough yes/no: No Fever (>100.50F)  yes/no: No Runny nose yes/no: No Sore throat yes/no: No Difficulty breathing/shortness of breath  yes/no: No  Have you traveled in the last 14 days and where? yes/no: No  Patient verbalized understanding of instructions that were given via phone.

## 2020-09-09 NOTE — Anesthesia Preprocedure Evaluation (Addendum)
Anesthesia Evaluation  Patient identified by MRN, date of birth, ID band Patient awake    Reviewed: Allergy & Precautions, NPO status , Patient's Chart, lab work & pertinent test results  Airway Mallampati: II  TM Distance: >3 FB Neck ROM: Full    Dental no notable dental hx. (+) Teeth Intact, Dental Advisory Given   Pulmonary asthma ,    Pulmonary exam normal breath sounds clear to auscultation       Cardiovascular hypertension, negative cardio ROS Normal cardiovascular exam Rhythm:Regular Rate:Normal     Neuro/Psych PSYCHIATRIC DISORDERS negative neurological ROS     GI/Hepatic negative GI ROS, Neg liver ROS,   Endo/Other  diabetes  Renal/GU Dialysis and ESRFRenal disease     Musculoskeletal negative musculoskeletal ROS (+)   Abdominal   Peds  Hematology Lab Results      Component                Value               Date                      WBC                      9.2                 08/19/2020                HGB                      13.0                08/19/2020                HCT                      41.4                08/19/2020                MCV                      85.4                08/19/2020                PLT                      311                 08/19/2020              Anesthesia Other Findings   Reproductive/Obstetrics                            Anesthesia Physical Anesthesia Plan  ASA: 4  Anesthesia Plan: General   Post-op Pain Management:    Induction: Intravenous  PONV Risk Score and Plan: 4 or greater and Treatment may vary due to age or medical condition, Midazolam, Dexamethasone and Ondansetron  Airway Management Planned: Oral ETT  Additional Equipment: None  Intra-op Plan:   Post-operative Plan: Extubation in OR  Informed Consent: I have reviewed the patients History and Physical, chart, labs and discussed the procedure including the risks,  benefits and alternatives for the proposed anesthesia with the patient or authorized representative  who has indicated his/her understanding and acceptance.     Dental advisory given  Plan Discussed with:   Anesthesia Plan Comments:        Anesthesia Quick Evaluation

## 2020-09-10 ENCOUNTER — Ambulatory Visit (HOSPITAL_COMMUNITY)
Admission: RE | Admit: 2020-09-10 | Discharge: 2020-09-10 | Disposition: A | Discharge status: Home / Self Care | Payer: 59 | Attending: Surgery | Admitting: Surgery

## 2020-09-10 ENCOUNTER — Ambulatory Visit (HOSPITAL_COMMUNITY): Payer: 59 | Admitting: Physician Assistant

## 2020-09-10 ENCOUNTER — Encounter (HOSPITAL_COMMUNITY): Payer: Self-pay | Admitting: Surgery

## 2020-09-10 ENCOUNTER — Other Ambulatory Visit: Payer: Self-pay

## 2020-09-10 ENCOUNTER — Encounter (HOSPITAL_COMMUNITY)
Admission: RE | Disposition: A | Discharge status: Home / Self Care | Payer: Self-pay | Source: Home / Self Care | Attending: Surgery

## 2020-09-10 DIAGNOSIS — E103413 Type 1 diabetes mellitus with severe nonproliferative diabetic retinopathy with macular edema, bilateral: Secondary | ICD-10-CM | POA: Diagnosis not present

## 2020-09-10 DIAGNOSIS — N186 End stage renal disease: Secondary | ICD-10-CM | POA: Insufficient documentation

## 2020-09-10 DIAGNOSIS — E1022 Type 1 diabetes mellitus with diabetic chronic kidney disease: Secondary | ICD-10-CM | POA: Diagnosis not present

## 2020-09-10 DIAGNOSIS — Z992 Dependence on renal dialysis: Secondary | ICD-10-CM | POA: Diagnosis not present

## 2020-09-10 DIAGNOSIS — Z794 Long term (current) use of insulin: Secondary | ICD-10-CM | POA: Diagnosis not present

## 2020-09-10 DIAGNOSIS — R112 Nausea with vomiting, unspecified: Secondary | ICD-10-CM | POA: Diagnosis not present

## 2020-09-10 DIAGNOSIS — Z79899 Other long term (current) drug therapy: Secondary | ICD-10-CM | POA: Insufficient documentation

## 2020-09-10 DIAGNOSIS — N179 Acute kidney failure, unspecified: Secondary | ICD-10-CM

## 2020-09-10 DIAGNOSIS — N185 Chronic kidney disease, stage 5: Secondary | ICD-10-CM

## 2020-09-10 DIAGNOSIS — I120 Hypertensive chronic kidney disease with stage 5 chronic kidney disease or end stage renal disease: Secondary | ICD-10-CM | POA: Diagnosis not present

## 2020-09-10 DIAGNOSIS — E1040 Type 1 diabetes mellitus with diabetic neuropathy, unspecified: Secondary | ICD-10-CM | POA: Insufficient documentation

## 2020-09-10 HISTORY — DX: End stage renal disease: Z99.2

## 2020-09-10 HISTORY — PX: CAPD INSERTION: SHX5233

## 2020-09-10 HISTORY — DX: End stage renal disease: N18.6

## 2020-09-10 LAB — POCT I-STAT, CHEM 8
BUN: 31 mg/dL — ABNORMAL HIGH (ref 6–20)
Calcium, Ion: 1.18 mmol/L (ref 1.15–1.40)
Chloride: 98 mmol/L (ref 98–111)
Creatinine, Ser: 5 mg/dL — ABNORMAL HIGH (ref 0.44–1.00)
Glucose, Bld: 197 mg/dL — ABNORMAL HIGH (ref 70–99)
HCT: 40 % (ref 36.0–46.0)
Hemoglobin: 13.6 g/dL (ref 12.0–15.0)
Potassium: 4.5 mmol/L (ref 3.5–5.1)
Sodium: 134 mmol/L — ABNORMAL LOW (ref 135–145)
TCO2: 28 mmol/L (ref 22–32)

## 2020-09-10 LAB — GLUCOSE, CAPILLARY
Glucose-Capillary: 204 mg/dL — ABNORMAL HIGH (ref 70–99)
Glucose-Capillary: 224 mg/dL — ABNORMAL HIGH (ref 70–99)

## 2020-09-10 LAB — POCT PREGNANCY, URINE: Preg Test, Ur: NEGATIVE

## 2020-09-10 SURGERY — LAPAROSCOPIC INSERTION CONTINUOUS AMBULATORY PERITONEAL DIALYSIS  (CAPD) CATHETER
Anesthesia: General | Site: Abdomen

## 2020-09-10 MED ORDER — PROPOFOL 10 MG/ML IV BOLUS
INTRAVENOUS | Status: DC | PRN
  Administered 2020-09-10: 100 mg via INTRAVENOUS
  Administered 2020-09-10: 50 mg via INTRAVENOUS

## 2020-09-10 MED ORDER — CHLORHEXIDINE GLUCONATE 0.12 % MT SOLN
OROMUCOSAL | Status: AC
  Administered 2020-09-10: 15 mL via OROMUCOSAL
  Filled 2020-09-10: qty 15

## 2020-09-10 MED ORDER — ROCURONIUM BROMIDE 10 MG/ML (PF) SYRINGE
PREFILLED_SYRINGE | INTRAVENOUS | Status: AC
  Filled 2020-09-10: qty 10

## 2020-09-10 MED ORDER — BUPIVACAINE-EPINEPHRINE 0.25% -1:200000 IJ SOLN
INTRAMUSCULAR | Status: DC | PRN
  Administered 2020-09-10: 7 mL

## 2020-09-10 MED ORDER — HYDROCODONE-ACETAMINOPHEN 5-325 MG PO TABS: 1.0000 | ORAL_TABLET | ORAL | 0 refills | Status: DC | PRN

## 2020-09-10 MED ORDER — ROCURONIUM BROMIDE 10 MG/ML (PF) SYRINGE
PREFILLED_SYRINGE | INTRAVENOUS | Status: DC | PRN
  Administered 2020-09-10: 20 mg via INTRAVENOUS
  Administered 2020-09-10: 40 mg via INTRAVENOUS

## 2020-09-10 MED ORDER — SODIUM CHLORIDE 0.9 % IV SOLN
INTRAVENOUS | Status: DC
Start: 2020-09-10 — End: 2020-09-10

## 2020-09-10 MED ORDER — ALBUMIN HUMAN 5 % IV SOLN: INTRAVENOUS | Status: DC | PRN

## 2020-09-10 MED ORDER — MIDAZOLAM HCL 2 MG/2ML IJ SOLN
INTRAMUSCULAR | Status: AC
  Filled 2020-09-10: qty 2

## 2020-09-10 MED ORDER — CEFAZOLIN SODIUM-DEXTROSE 2-4 GM/100ML-% IV SOLN
INTRAVENOUS | Status: AC
  Filled 2020-09-10: qty 100

## 2020-09-10 MED ORDER — CEFAZOLIN SODIUM-DEXTROSE 2-4 GM/100ML-% IV SOLN
2.0000 g | INTRAVENOUS | Status: AC
  Administered 2020-09-10: 2 g via INTRAVENOUS

## 2020-09-10 MED ORDER — OXYCODONE HCL 5 MG PO TABS
ORAL_TABLET | ORAL | Status: AC
  Administered 2020-09-10: 5 mg via ORAL
  Filled 2020-09-10: qty 1

## 2020-09-10 MED ORDER — LIDOCAINE 2% (20 MG/ML) 5 ML SYRINGE
INTRAMUSCULAR | Status: DC | PRN
  Administered 2020-09-10: 100 mg via INTRAVENOUS

## 2020-09-10 MED ORDER — PHENYLEPHRINE 40 MCG/ML (10ML) SYRINGE FOR IV PUSH (FOR BLOOD PRESSURE SUPPORT)
PREFILLED_SYRINGE | INTRAVENOUS | Status: AC
  Filled 2020-09-10: qty 10

## 2020-09-10 MED ORDER — SUGAMMADEX SODIUM 200 MG/2ML IV SOLN
INTRAVENOUS | Status: DC | PRN
  Administered 2020-09-10: 200 mg via INTRAVENOUS

## 2020-09-10 MED ORDER — FENTANYL CITRATE (PF) 100 MCG/2ML IJ SOLN
25.0000 ug | INTRAMUSCULAR | Status: DC | PRN
  Administered 2020-09-10: 50 ug via INTRAVENOUS

## 2020-09-10 MED ORDER — HEPARIN 6000 UNIT IRRIGATION SOLUTION
Status: AC
  Filled 2020-09-10: qty 500

## 2020-09-10 MED ORDER — ONDANSETRON HCL 4 MG/2ML IJ SOLN: 4.0000 mg | Freq: Once | INTRAMUSCULAR | Status: DC | PRN

## 2020-09-10 MED ORDER — SODIUM CHLORIDE 0.9 % IR SOLN
Status: DC | PRN
  Administered 2020-09-10: 1000 mL

## 2020-09-10 MED ORDER — PROPOFOL 10 MG/ML IV BOLUS
INTRAVENOUS | Status: AC
  Filled 2020-09-10: qty 20

## 2020-09-10 MED ORDER — ONDANSETRON HCL 4 MG/2ML IJ SOLN
INTRAMUSCULAR | Status: AC
  Filled 2020-09-10: qty 2

## 2020-09-10 MED ORDER — 0.9 % SODIUM CHLORIDE (POUR BTL) OPTIME
TOPICAL | Status: DC | PRN
  Administered 2020-09-10: 1000 mL

## 2020-09-10 MED ORDER — FENTANYL CITRATE (PF) 100 MCG/2ML IJ SOLN
INTRAMUSCULAR | Status: AC
  Filled 2020-09-10: qty 2

## 2020-09-10 MED ORDER — LIDOCAINE 2% (20 MG/ML) 5 ML SYRINGE
INTRAMUSCULAR | Status: AC
  Filled 2020-09-10: qty 5

## 2020-09-10 MED ORDER — AMISULPRIDE (ANTIEMETIC) 5 MG/2ML IV SOLN: 10.0000 mg | Freq: Once | INTRAVENOUS | Status: DC | PRN

## 2020-09-10 MED ORDER — CHLORHEXIDINE GLUCONATE 4 % EX LIQD: 60.0000 mL | Freq: Once | CUTANEOUS | Status: DC

## 2020-09-10 MED ORDER — OXYCODONE HCL 5 MG/5ML PO SOLN: 5.0000 mg | Freq: Once | ORAL | Status: AC | PRN

## 2020-09-10 MED ORDER — FENTANYL CITRATE (PF) 250 MCG/5ML IJ SOLN
INTRAMUSCULAR | Status: DC | PRN
  Administered 2020-09-10: 50 ug via INTRAVENOUS

## 2020-09-10 MED ORDER — PHENYLEPHRINE 40 MCG/ML (10ML) SYRINGE FOR IV PUSH (FOR BLOOD PRESSURE SUPPORT)
PREFILLED_SYRINGE | INTRAVENOUS | Status: DC | PRN
  Administered 2020-09-10: 120 ug via INTRAVENOUS
  Administered 2020-09-10: 80 ug via INTRAVENOUS

## 2020-09-10 MED ORDER — ACETAMINOPHEN 10 MG/ML IV SOLN: 1000.0000 mg | Freq: Once | INTRAVENOUS | Status: DC | PRN

## 2020-09-10 MED ORDER — ORAL CARE MOUTH RINSE: 15.0000 mL | Freq: Once | OROMUCOSAL | Status: AC

## 2020-09-10 MED ORDER — FENTANYL CITRATE (PF) 250 MCG/5ML IJ SOLN
INTRAMUSCULAR | Status: AC
  Filled 2020-09-10: qty 5

## 2020-09-10 MED ORDER — OXYCODONE HCL 5 MG PO TABS
5.0000 mg | ORAL_TABLET | Freq: Once | ORAL | Status: AC | PRN
Start: 2020-09-10 — End: 2020-09-10

## 2020-09-10 MED ORDER — MIDAZOLAM HCL 2 MG/2ML IJ SOLN
INTRAMUSCULAR | Status: DC | PRN
  Administered 2020-09-10: 1 mg via INTRAVENOUS

## 2020-09-10 MED ORDER — ONDANSETRON HCL 4 MG/2ML IJ SOLN
INTRAMUSCULAR | Status: DC | PRN
  Administered 2020-09-10: 4 mg via INTRAVENOUS

## 2020-09-10 MED ORDER — BUPIVACAINE HCL (PF) 0.25 % IJ SOLN
INTRAMUSCULAR | Status: AC
  Filled 2020-09-10: qty 30

## 2020-09-10 MED ORDER — CHLORHEXIDINE GLUCONATE 0.12 % MT SOLN: 15.0000 mL | Freq: Once | OROMUCOSAL | Status: AC

## 2020-09-10 MED ORDER — SODIUM CHLORIDE 0.9 % IV SOLN: INTRAVENOUS | Status: DC

## 2020-09-10 SURGICAL SUPPLY — 49 items
ADAPTER TITANIUM MEDIONICS (MISCELLANEOUS) ×3 IMPLANT
BAG DECANTER FOR FLEXI CONT (MISCELLANEOUS) ×3 IMPLANT
BIOPATCH RED 1 DISK 7.0 (GAUZE/BANDAGES/DRESSINGS) ×2 IMPLANT
BIOPATCH RED 1IN DISK 7.0MM (GAUZE/BANDAGES/DRESSINGS) ×1
BLADE CLIPPER SURG (BLADE) IMPLANT
CATH EXTENDED DIALYSIS (CATHETERS) ×3 IMPLANT
CHLORAPREP W/TINT 26 (MISCELLANEOUS) ×3 IMPLANT
CLIP LIGATING EXTRA MED SLVR (CLIP) ×3 IMPLANT
CLIP LIGATING EXTRA SM BLUE (MISCELLANEOUS) ×3 IMPLANT
COVER SURGICAL LIGHT HANDLE (MISCELLANEOUS) ×3 IMPLANT
DECANTER SPIKE VIAL GLASS SM (MISCELLANEOUS) ×3 IMPLANT
DERMABOND ADVANCED (GAUZE/BANDAGES/DRESSINGS) ×2
DERMABOND ADVANCED .7 DNX12 (GAUZE/BANDAGES/DRESSINGS) ×1 IMPLANT
DEVICE TROCAR PUNCTURE CLOSURE (ENDOMECHANICALS) ×3 IMPLANT
DRSG TEGADERM 4X4.75 (GAUZE/BANDAGES/DRESSINGS) ×9 IMPLANT
ELECT REM PT RETURN 9FT ADLT (ELECTROSURGICAL) ×3
ELECTRODE REM PT RTRN 9FT ADLT (ELECTROSURGICAL) ×1 IMPLANT
GAUZE SPONGE 4X4 12PLY STRL (GAUZE/BANDAGES/DRESSINGS) ×3 IMPLANT
GLOVE SRG 8 PF TXTR STRL LF DI (GLOVE) ×1 IMPLANT
GLOVE SURG POLYISO LF SZ7.5 (GLOVE) ×3 IMPLANT
GLOVE SURG UNDER POLY LF SZ8 (GLOVE) ×2
GOWN STRL REUS W/ TWL LRG LVL3 (GOWN DISPOSABLE) ×2 IMPLANT
GOWN STRL REUS W/ TWL XL LVL3 (GOWN DISPOSABLE) ×1 IMPLANT
GOWN STRL REUS W/TWL LRG LVL3 (GOWN DISPOSABLE) ×4
GOWN STRL REUS W/TWL XL LVL3 (GOWN DISPOSABLE) ×2
GRASPER SUT TROCAR 14GX15 (MISCELLANEOUS) ×3 IMPLANT
KIT BASIN OR (CUSTOM PROCEDURE TRAY) ×3 IMPLANT
KIT TURNOVER KIT B (KITS) ×3 IMPLANT
NEEDLE INSUFFLATION 14GA 120MM (NEEDLE) IMPLANT
NS IRRIG 1000ML POUR BTL (IV SOLUTION) ×3 IMPLANT
PAD ARMBOARD 7.5X6 YLW CONV (MISCELLANEOUS) ×6 IMPLANT
SET CYSTO W/LG BORE CLAMP LF (SET/KITS/TRAYS/PACK) ×3 IMPLANT
SET EXT 12IN DIALYSIS STAY-SAF (MISCELLANEOUS) ×3 IMPLANT
SET IRRIG TUBING LAPAROSCOPIC (IRRIGATION / IRRIGATOR) IMPLANT
SET TUBE SMOKE EVAC HIGH FLOW (TUBING) ×3 IMPLANT
SLEEVE ENDOPATH XCEL 5M (ENDOMECHANICALS) ×6 IMPLANT
STYLET FALLER (MISCELLANEOUS) IMPLANT
STYLET FALLER MEDIONICS (MISCELLANEOUS) IMPLANT
SUT MNCRL AB 4-0 PS2 18 (SUTURE) ×3 IMPLANT
SUT PROLENE 0 SH 30 (SUTURE) ×3 IMPLANT
SUT SILK 0 TIES 10X30 (SUTURE) ×3 IMPLANT
SUT VICRYL 3 0 (SUTURE) IMPLANT
TOWEL GREEN STERILE (TOWEL DISPOSABLE) ×3 IMPLANT
TOWEL GREEN STERILE FF (TOWEL DISPOSABLE) ×3 IMPLANT
TRAY LAPAROSCOPIC MC (CUSTOM PROCEDURE TRAY) ×3 IMPLANT
TROCAR 5MMX150MM (TROCAR) IMPLANT
TROCAR XCEL NON-BLD 11X100MML (ENDOMECHANICALS) IMPLANT
TROCAR XCEL NON-BLD 5MMX100MML (ENDOMECHANICALS) ×3 IMPLANT
WATER STERILE IRR 1000ML POUR (IV SOLUTION) ×3 IMPLANT

## 2020-09-10 NOTE — Op Note (Signed)
    Patient name: Barbara Donovan MRN: [id number] DOB: 23-Jul-1990 Sex: female  09/10/2020 Pre-operative Diagnosis: ESRD Post-operative diagnosis:  Same Surgeon:  Annamarie Major Assistants:  Christie Beckers, MD Procedure:   Laparoscopic peritoneal dialysis catheter Anesthesia:  General Blood Loss:  Minimal Specimens:  None  Findings:  Minimal adhesions  Indications:  This is a 30 year old with ESRD in need of a PD catheter  Procedure:  The patient was identified in the holding area and taken to [place] [facility]  The patient was then placed supine on the table. general anesthesia was administered.  The patient was prepped and draped in the usual sterile fashion.  A time out was called and antibiotics were administered.  A MD was necessary to facilitate the case.  1% lidocaine was used for local anesthesia and all port sites.  A #11 blade was used to make a right upper quadrant skin nick.  Next using the Optiview 5 mm port, the abdomen was entered under direct vision.  It was then insufflated.  The abdomen was then inspected with the camera.  There was no gross pathology.  There were minimal adhesions.  Next, a right lower quadrant 5 mm port was then inserted.  I had previously measured from the pubic symphysis to just above the umbilicus for the first cuff.  A skin nick was made at this level.  A trocar was then tunneled towards the pelvis and then entered under direct vision.  The peritoneal catheter was then inserted through this port and the cuff was situated within the posterior rectus muscle.  Next, a subxiphoid incision was then made and using a metal connector, the additional tubing was connected and then tunneled from the supra umbilical incision to under the xiphoid.  The tunneler was then used for an exit site several fingerbreadths below the left costal margin.  The catheter was then connected to a saline bag and 700 cc of fluid was instilled into the abdomen under gravity drainage.  The fluid was  free-flowing.  We then drained out approximately 500 cc of saline.  The abdomen was then inspected again.  There was no incidental injury noted during trocar placement.  The catheter was in good position.  The 5 mm port was then removed.  The gas was then turned off and evacuated from the abdomen and then the right upper quadrant port was removed.  The skin nicks were closed with 4-0 Monocryl followed by Dermabond.  Patient was successfully extubated after sterile dressings were applied.  There were no immediate complications.   Disposition: To PACU stable.   Theotis Burrow, M.D., [facility] [place] Office: [phone] Pager:  [phone]

## 2020-09-10 NOTE — Transfer of Care (Signed)
Immediate Anesthesia Transfer of Care Note  Patient: Barbara Donovan  Procedure(s) Performed: LAPAROSCOPIC INSERTION CONTINUOUS AMBULATORY PERITONEAL DIALYSIS  (CAPD) CATHETER (Abdomen)  Patient Location: PACU  Anesthesia Type:General  Level of Consciousness: drowsy and patient cooperative  Airway & Oxygen Therapy: Patient Spontanous Breathing and Patient connected to face mask oxygen  Post-op Assessment: Report given to RN and Post -op Vital signs reviewed and stable  Post vital signs: Reviewed and stable  Last Vitals:  Vitals Value Taken Time  BP    Temp    Pulse    Resp    SpO2      Last Pain:  Vitals:   09/10/20 0659  TempSrc:   PainSc: 0-No pain         Complications: No notable events documented.

## 2020-09-10 NOTE — Anesthesia Procedure Notes (Signed)
Procedure Name: Intubation Date/Time: 09/10/2020 8:30 AM Performed by: Kathryne Hitch, CRNA Pre-anesthesia Checklist: Patient identified, Emergency Drugs available, Suction available and Patient being monitored Patient Re-evaluated:Patient Re-evaluated prior to induction Oxygen Delivery Method: Circle system utilized Preoxygenation: Pre-oxygenation with 100% oxygen Induction Type: IV induction Ventilation: Mask ventilation without difficulty Laryngoscope Size: Miller and 3 Grade View: Grade III Tube type: Oral Number of attempts: 2 Airway Equipment and Method: Stylet and Oral airway Placement Confirmation: ETT inserted through vocal cords under direct vision, positive ETCO2 and breath sounds checked- equal and bilateral Secured at: 22 cm Tube secured with: Tape Dental Injury: Teeth and Oropharynx as per pre-operative assessment  Comments: CRNA DVL x2. MDA intubated patient with Sabra Heck 3.

## 2020-09-10 NOTE — Interval H&P Note (Signed)
History and Physical Interval Note:  09/10/2020 8:03 AM  Barbara Donovan  has presented today for surgery, with the diagnosis of ESRD.  The various methods of treatment have been discussed with the patient and family. After consideration of risks, benefits and other options for treatment, the patient has consented to  Procedure(s): [place]  (CAPD) CATHETER (N/A) as a surgical intervention.  The patient's history has been reviewed, patient examined, no change in status, stable for surgery.  I have reviewed the patient's chart and labs.  Questions were answered to the patient's satisfaction.     Annamarie Major

## 2020-09-10 NOTE — Anesthesia Postprocedure Evaluation (Signed)
Anesthesia Post Note  Patient: SHAKTHI JANUS  Procedure(s) Performed: LAPAROSCOPIC INSERTION CONTINUOUS AMBULATORY PERITONEAL DIALYSIS  (CAPD) CATHETER (Abdomen)     Patient location during evaluation: PACU Anesthesia Type: General Level of consciousness: awake and alert Pain management: pain level controlled Vital Signs Assessment: post-procedure vital signs reviewed and stable Respiratory status: spontaneous breathing, nonlabored ventilation, respiratory function stable and patient connected to nasal cannula oxygen Cardiovascular status: blood pressure returned to baseline and stable Postop Assessment: no apparent nausea or vomiting Anesthetic complications: no   No notable events documented.  Last Vitals:  Vitals:   09/10/20 1040 09/10/20 1052  BP: (!) 130/91 (!) 140/93  Pulse: 80 81  Resp: 10 18  Temp:  36.7 C  SpO2: 94% 95%    Last Pain:  Vitals:   09/10/20 1052  TempSrc:   PainSc: 4                  [place]et [place]

## 2020-09-11 ENCOUNTER — Encounter (HOSPITAL_COMMUNITY): Payer: Self-pay | Admitting: Surgery

## 2020-10-09 ENCOUNTER — Encounter: Payer: Self-pay | Admitting: Physician Assistant

## 2020-10-09 ENCOUNTER — Ambulatory Visit (INDEPENDENT_AMBULATORY_CARE_PROVIDER_SITE_OTHER): Payer: 59 | Admitting: Physician Assistant

## 2020-10-09 VITALS — BP 146/84 | HR 75 | Ht 66.0 in | Wt 145.1 lb

## 2020-10-09 DIAGNOSIS — R112 Nausea with vomiting, unspecified: Secondary | ICD-10-CM | POA: Diagnosis not present

## 2020-10-09 DIAGNOSIS — IMO0002 Reserved for concepts with insufficient information to code with codable children: Secondary | ICD-10-CM

## 2020-10-09 DIAGNOSIS — N185 Chronic kidney disease, stage 5: Secondary | ICD-10-CM

## 2020-10-09 DIAGNOSIS — E1065 Type 1 diabetes mellitus with hyperglycemia: Secondary | ICD-10-CM

## 2020-10-09 DIAGNOSIS — E1022 Type 1 diabetes mellitus with diabetic chronic kidney disease: Secondary | ICD-10-CM | POA: Diagnosis not present

## 2020-10-09 NOTE — Progress Notes (Signed)
Chief Complaint: Follow-up nausea and vomiting  HPI:    Barbara Donovan is a 30 year old female with a past medical history as listed below including ESRD on HD, known to Dr. Fuller Plan, who returns to clinic today for follow-up of nausea and vomiting.   11/28/2019 office visit with Dr. Fuller Plan.  At that time discussed she had longstanding type 1 diabetes.  She had occasional mild left upper quadrant pain and been seen in the ED for refractory nausea and vomiting as well as diabetic ketoacidosis.  She was set up for EGD.  Also scheduled for abdominal ultrasound.  Refilled Phenergan 25 mg p.o. every 6 hours.    12/07/2019 right upper quadrant ultrasound was negative.    01/19/2020 CT of the abdomen pelvis without contrast showed no acute abnormality.    01/31/2020 EGD with Dr. Fuller Plan with normal esophagus, focally erythematous mucosa in the gastric fundus, normal duodenal bulb and second portion of the duodenum.  Pathology negative for H. Pylori, patient started on Pantoprazole 40 mg daily.    04/18/2020 gastric emptying study was normal.    06/01/2020 abdominal x-ray with no evidence for acute abnormality.    Per review of chart it appears patient has been admitted numerous times for gastroparesis since then (11 that I can count here at [facility]).    Most recently patient seen in the ED 08/19/2020 after being brought by the EMS from home with upper abdominal pain, nausea and vomiting.  She was given fentanyl and Zofran by the EMS with resolution of her symptoms.  She had tried Zofran, Reglan, Phenergan suppositories at home without relief.    08/25/2020 patient seen in clinic by the vascular surgeons for discussion of peritoneal dialysis catheter.  Apparently they delayed placement of the catheter until she had "GI work-up" and was cleared from our perspective for her nausea and vomiting.    08/28/2020 patient seen in clinic and described continuous nausea and vomiting which was typically occur at least 1-2 times a week.  At  that time discussed patient had multiple evaluations in the ER and imaging studies including ultrasound, CT, EGD, gastric emptying study all of which have been unrevealing but she was treated like she had gastroparesis given known uncontrolled diabetes.  Discussed that we could trial scheduling out her Reglan 5 mg 4 times daily before meals and at bedtime.  Dr. Fuller Plan noted that patient had a normal gastric emptying scan in April 2022 and had extensive GI evaluation.  Does recommend she optimize diabetic control and if improved control of diabetes did not improve her nausea/vomiting then would recommend GI evaluation at a tertiary center.    09/10/2020 patient had a peritoneal catheter placed.    Today, patient presents to clinic and tells me that she feels almost 100% better.  The only time she has vomited over the past 6 weeks is 1 time when she had dialysis and had not eaten anything and the other time after she had her peritoneal catheter placed.  She is currently using her Reglan 5 mg scheduled 4 times daily.  She is very happy with the results.    Denies fever, chills, abdominal pain or change in bowel habits  Past Medical History:  Diagnosis Date   Asthma    as a child, no problems as an adult, no inhaler   Cataract    NS OU   Chronic hypertension during pregnancy, antepartum 08/19/2017   Dehydration 01/28/2018   Depression during pregnancy, antepartum 07/07/2017   6/20:  Short trial of zoloft previously, reports didn't help much but also didn't give it a chance Discussed r/b/a SSRIs in pregnancy, agrees to try Zoloft again, rx sent No SI/HI/red flags   Diabetes ([place])    TYPE I. A1C 7.5% 05/31/20   Diabetic retinopathy ([place]) 06/09/2017   07/2017 with bilateral severe diabetic non-proliferative retinopathy with macular edema.   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 A2/B GDM)  Required Referrals for A1GDM or A2GDM: '[x]'$  Diabetes Education and Testing Supplies '[x]'$  Nutrition Cousult  For A2/B GDM or higher classes of DM '[x]'$  Diabetes Education and Testing Supplies '[x]'$  Nutrition Counsult '[x]'$  Fetal ECHO after 20 weeks  '[x]'$  Eye exam for retina evaluation - severe retinopathy 7/19  Base   Ventricular septal defect (VSD) of fetus in singleton pregnancy, antepartum 09/30/2017   May go to newborn nursery per Dr. Lenard Simmer Echo prior to discharge    Past Surgical History:  Procedure Laterality Date   25 GAUGE PARS PLANA VITRECTOMY WITH 20 GAUGE MVR PORT FOR MACULAR HOLE Left 07/20/2018   Procedure: 25 GAUGE PARS PLANA VITRECTOMY LEFT EYE;  Surgeon: Bernarda Caffey, MD;  Location: [place];  Service: Ophthalmology;  Laterality: Left;   CAPD INSERTION N/A 09/10/2020   Procedure: LAPAROSCOPIC INSERTION CONTINUOUS AMBULATORY PERITONEAL DIALYSIS  (CAPD) CATHETER;  Surgeon: Serafina Mitchell, MD;  Location: [place];  Service: Vascular;  Laterality: N/A;   CESAREAN SECTION N/A 12/26/2017   Procedure: CESAREAN SECTION;  Surgeon: Osborne Oman, MD;  Location: [place];  Service: Obstetrics;  Laterality: N/A;   EYE EXAMINATION UNDER ANESTHESIA Right 07/20/2018   Procedure: Eye Exam Under Anesthesia RIGHT EYE;  Surgeon: Bernarda Caffey, MD;  Location: [place];  Service: Ophthalmology;  Laterality: Right;   EYE SURGERY Left 07/2018   GAS INSERTION Left 07/19/2019   Procedure: INSERTION OF GAS;  Surgeon: Bernarda Caffey, MD;   Location: [place];  Service: Ophthalmology;  Laterality: Left;  SF6   INJECTION OF SILICONE OIL Left 99991111   Procedure: Injection Of Silicone Oil LEFT EYE;  Surgeon: Bernarda Caffey, MD;  Location: [place];  Service: Ophthalmology;  Laterality: Left;   IR FLUORO GUIDE CV LINE RIGHT  06/02/2020   IR US GUIDE VASC ACCESS RIGHT  06/02/2020   LASER PHOTO ABLATION Right 07/20/2018   Procedure: Laser Photo Ablation RIGHT EYE;  Surgeon: Bernarda Caffey, MD;  Location: [place];  Service: Ophthalmology;  Laterality: Right;   MEMBRANE PEEL Left 07/20/2018   Procedure: Membrane Peel LEFT EYE;  Surgeon: Bernarda Caffey, MD;  Location: [place];  Service: Ophthalmology;  Laterality: Left;   MEMBRANE PEEL Left 07/19/2019   Procedure: MEMBRANE PEEL;  Surgeon: Bernarda Caffey, MD;  Location: [place];  Service: Ophthalmology;  Laterality: Left;   MITOMYCIN C APPLICATION Bilateral 99991111   Procedure: Avastin Application;  Surgeon: Bernarda Caffey, MD;  Location: [place] [facility];  Service: Ophthalmology;  Laterality: Bilateral;   PHOTOCOAGULATION WITH LASER Left 07/20/2018   Procedure: Photocoagulation With Laser LEFT EYE;  Surgeon: Bernarda Caffey, MD;  Location: [place];  Service: Ophthalmology;  Laterality: Left;   PHOTOCOAGULATION WITH LASER Left 07/19/2019   Procedure: PHOTOCOAGULATION WITH LASER;  Surgeon: Bernarda Caffey, MD;  Location: [place];  Service: Ophthalmology;  Laterality: Left;   RETINAL DETACHMENT SURGERY Left 07/20/2018   Dr. Bernarda Caffey   SILICON OIL REMOVAL Left 07/19/2019   Procedure: 25g PARS PLANA VITRECTOMY WITH SILICON OIL REMOVAL;  Surgeon: Bernarda Caffey, MD;  Location: [place];  Service: Ophthalmology;  Laterality: Left;   WISDOM TOOTH EXTRACTION      Current Outpatient Medications  Medication Sig Dispense Refill   amLODipine (NORVASC) 10 MG tablet Take 1 tablet (10 mg total) by mouth daily. 90 tablet 1   carvedilol (COREG) 25 MG tablet Take 1 tablet (25 mg total) by mouth 2 (two) times daily. 180 tablet 3   Continuous Blood  Gluc Sensor MISC 1 each by Does not apply route as directed. Use as directed every 14 days. May dispense FreeStyle [employer] or similar. 1 each 0   hydrALAZINE (APRESOLINE) 50 MG tablet Take 2 tablets (100 mg total) by mouth 3 (three) times daily. 180 tablet 0   HYDROcodone-acetaminophen (NORCO/VICODIN) 5-325 MG tablet Take 1 tablet by mouth every 4 (four) hours as needed for moderate pain. 20 tablet 0   insulin glargine (LANTUS SOLOSTAR) 100 UNIT/ML Solostar Pen Inject 10 Units into the skin every morning. (Patient taking differently: Inject 16 Units into the skin every morning.) 15 mL 3   insulin lispro (HUMALOG KWIKPEN) 100 UNIT/ML KwikPen Inject 2 Units into the skin 3 (three) times daily with meals. 15 mL 3   LORazepam (ATIVAN) 0.5 MG tablet Take 0.5-1 mg by mouth daily as needed for nausea.     losartan (COZAAR) 50 MG tablet Take 2 tablets (100 mg total) by mouth daily. 60 tablet 0   metoCLOPramide (REGLAN) 5 MG tablet Take 1 tablet (5 mg total) by mouth 4 (four) times daily -  before meals and at bedtime. 120 tablet 3   multivitamin (RENA-VIT) TABS tablet Take 1 tablet by mouth at bedtime. 90 tablet 1   ondansetron (ZOFRAN ODT) 4 MG disintegrating tablet Take 1 tablet (4 mg total) by mouth every 8 (eight) hours as needed for nausea or vomiting. 20 tablet 0   pantoprazole (PROTONIX) 40 MG tablet Take 1 tablet (40 mg total) by mouth daily. 90 tablet 3   promethazine (PHENERGAN) 25 MG suppository Place 1 suppository (25 mg total) rectally every 6 (six) hours as needed for nausea or vomiting. 12 each 0   No current facility-administered medications for this visit.    Allergies as of 10/09/2020   (No Known Allergies)    Family History  Problem Relation Age of Onset   Diabetes Mother    Aneurysm Mother 56   Seizures Mother    Diabetes Father    Cataracts Father    COPD Father    Heart attack Father    Heart disease Father    Healthy Sister    Healthy Daughter    Stroke  Maternal Grandfather    Amblyopia Neg Hx    Blindness Neg Hx    Glaucoma Neg Hx    Macular degeneration Neg Hx    Retinal detachment Neg Hx    Strabismus Neg Hx    Retinitis pigmentosa Neg  Hx    Colon cancer Neg Hx    Stomach cancer Neg Hx    Esophageal cancer Neg Hx    Pancreatic cancer Neg Hx    Liver disease Neg Hx     Social History   Socioeconomic History   Marital status: Significant Other    Spouse name: Not on file   Number of children: Not on file   Years of education: Not on file   Highest education level: Not on file  Occupational History   Not on file  Tobacco Use   Smoking status: Never   Smokeless tobacco: Never  Vaping Use   Vaping Use: Never used  Substance and Sexual Activity   Alcohol use: Not Currently   Drug use: Never   Sexual activity: Not Currently    Birth control/protection: None  Other Topics Concern   Not on file  Social History Narrative   Not on file   Social Determinants of Health   Financial Resource Strain: Not on file  Food Insecurity: Not on file  Transportation Needs: Not on file  Physical Activity: Not on file  Stress: Not on file  Social Connections: Not on file  Intimate Partner Violence: Not on file    Review of Systems:    Constitutional: No weight loss, fever or chills Cardiovascular: No chest pain Respiratory: No SOB  Gastrointestinal: See HPI and otherwise negative   Physical Exam:  Vital signs: BP (!) 146/84   Pulse 75   Ht '5\' 6"'$  (1.676 m)   Wt 145 lb 2 oz (65.8 kg)   SpO2 92%   BMI 23.42 kg/m    Constitutional:   Pleasant AA female appears to be in NAD, Well developed, Well nourished, alert and cooperative Respiratory: Respirations even and unlabored. Lungs clear to auscultation bilaterally.   No wheezes, crackles, or rhonchi.  Cardiovascular: Normal S1, S2. No MRG. Regular rate and rhythm. No peripheral edema, cyanosis or pallor.  Gastrointestinal:  Soft, nondistended, nontender. No rebound or  guarding. Normal bowel sounds. No appreciable masses or hepatomegaly.+peritoneal catheter Rectal:  Not performed.  Psychiatric: Oriented to person, place and time. Demonstrates good judgement and reason without abnormal affect or behaviors.  RELEVANT LABS AND IMAGING: CBC    Component Value Date/Time   WBC 9.2 08/19/2020 1934   RBC 4.85 08/19/2020 1934   HGB 13.6 09/10/2020 0632   HGB 9.7 (L) 12/22/2017 1459   HGB 13.0 07/07/2017 0000   HCT 40.0 09/10/2020 0632   HCT 28.5 (L) 12/22/2017 1459   HCT 39 07/07/2017 0000   PLT 311 08/19/2020 1934   PLT 427 12/22/2017 1459   PLT 374 07/07/2017 0000   MCV 85.4 08/19/2020 1934   MCV 82 12/22/2017 1459   MCH 26.8 08/19/2020 1934   MCHC 31.4 08/19/2020 1934   RDW 17.3 (H) 08/19/2020 1934   RDW 13.0 12/22/2017 1459   LYMPHSABS 1.2 08/19/2020 1934   MONOABS 0.4 08/19/2020 1934   EOSABS 0.0 08/19/2020 1934   BASOSABS 0.1 08/19/2020 1934    CMP     Component Value Date/Time   NA 134 (L) 09/10/2020 0632   NA 139 04/05/2019 1435   K 4.5 09/10/2020 0632   CL 98 09/10/2020 0632   CO2 27 08/19/2020 1934   GLUCOSE 197 (H) 09/10/2020 0632   BUN 31 (H) 09/10/2020 0632   BUN 31 (H) 04/05/2019 1435   CREATININE 5.00 (H) 09/10/2020 0632   CALCIUM 9.2 08/19/2020 1934   PROT 6.9 08/19/2020 1934  PROT 4.5 (L) 12/22/2017 1459   ALBUMIN 3.2 (L) 08/19/2020 1934   ALBUMIN 2.2 (L) 12/22/2017 1459   AST 16 08/19/2020 1934   ALT 12 08/19/2020 1934   ALKPHOS 60 08/19/2020 1934   BILITOT 1.2 08/19/2020 1934   BILITOT <0.2 12/22/2017 1459   GFRNONAA 12 (L) 08/19/2020 1934   GFRAA 30 (L) 09/15/2019 0358    Assessment: 1.  Chronic nausea and vomiting: Multiple evaluations in the ER and imaging studies including ultrasound, CT, EGD, gastric emptying study all of which have been unrevealing, symptoms now better on scheduled Reglan 5 mg 4 times daily 2.  ESRD on HD: Recent peritoneal catheter placement 3.  Uncontrolled diabetes  Plan: 1.   Continue Reglan 5 mg 4 times daily, 20 to 30 minutes before meals and at bedtime.  Patient was told to call if she needs further refills and be happy to refill for the next year. 2.  Patient to follow this as needed.  Ellouise Newer, PA-C [facility] 10/09/2020, 2:05 PM  Cc: Fanny Bien, MD

## 2020-10-10 NOTE — Progress Notes (Signed)
Reviewed and agree with management plan.  Verlyn Lambert T. Digby Groeneveld, MD FACG 

## 2020-11-06 ENCOUNTER — Other Ambulatory Visit: Payer: Self-pay

## 2020-11-06 ENCOUNTER — Telehealth: Payer: Self-pay | Admitting: Endocrinology

## 2020-11-06 DIAGNOSIS — E10319 Type 1 diabetes mellitus with unspecified diabetic retinopathy without macular edema: Secondary | ICD-10-CM

## 2020-11-06 MED ORDER — LANTUS SOLOSTAR 100 UNIT/ML ~~LOC~~ SOPN
16.0000 [IU] | PEN_INJECTOR | SUBCUTANEOUS | Status: DC
Start: 2020-11-06 — End: 2020-11-13

## 2020-11-06 NOTE — Telephone Encounter (Signed)
Rx sent 

## 2020-11-06 NOTE — Telephone Encounter (Signed)
Pt calling in to request refill of Lantus Solostar insulin kwikpen to  [id number] [place], [place] - [address] I have scheduled next appt 11/11  Pt contact [phone]

## 2020-11-10 ENCOUNTER — Encounter (HOSPITAL_BASED_OUTPATIENT_CLINIC_OR_DEPARTMENT_OTHER): Payer: Self-pay

## 2020-11-10 ENCOUNTER — Inpatient Hospital Stay (HOSPITAL_BASED_OUTPATIENT_CLINIC_OR_DEPARTMENT_OTHER)
Admission: EM | Admit: 2020-11-10 | Discharge: 2020-11-13 | DRG: 637 | Disposition: A | Discharge status: Home / Self Care | Payer: 59 | Attending: Internal Medicine | Admitting: Internal Medicine

## 2020-11-10 ENCOUNTER — Other Ambulatory Visit: Payer: Self-pay

## 2020-11-10 DIAGNOSIS — I160 Hypertensive urgency: Secondary | ICD-10-CM | POA: Diagnosis present

## 2020-11-10 DIAGNOSIS — E860 Dehydration: Secondary | ICD-10-CM | POA: Diagnosis present

## 2020-11-10 DIAGNOSIS — Z79899 Other long term (current) drug therapy: Secondary | ICD-10-CM

## 2020-11-10 DIAGNOSIS — E1022 Type 1 diabetes mellitus with diabetic chronic kidney disease: Secondary | ICD-10-CM | POA: Diagnosis present

## 2020-11-10 DIAGNOSIS — D631 Anemia in chronic kidney disease: Secondary | ICD-10-CM | POA: Diagnosis present

## 2020-11-10 DIAGNOSIS — E1043 Type 1 diabetes mellitus with diabetic autonomic (poly)neuropathy: Secondary | ICD-10-CM | POA: Diagnosis present

## 2020-11-10 DIAGNOSIS — N179 Acute kidney failure, unspecified: Secondary | ICD-10-CM

## 2020-11-10 DIAGNOSIS — E10319 Type 1 diabetes mellitus with unspecified diabetic retinopathy without macular edema: Secondary | ICD-10-CM | POA: Diagnosis present

## 2020-11-10 DIAGNOSIS — E1065 Type 1 diabetes mellitus with hyperglycemia: Secondary | ICD-10-CM | POA: Diagnosis not present

## 2020-11-10 DIAGNOSIS — Z794 Long term (current) use of insulin: Secondary | ICD-10-CM | POA: Diagnosis not present

## 2020-11-10 DIAGNOSIS — Z20822 Contact with and (suspected) exposure to covid-19: Secondary | ICD-10-CM | POA: Diagnosis present

## 2020-11-10 DIAGNOSIS — N2581 Secondary hyperparathyroidism of renal origin: Secondary | ICD-10-CM | POA: Diagnosis present

## 2020-11-10 DIAGNOSIS — R739 Hyperglycemia, unspecified: Secondary | ICD-10-CM

## 2020-11-10 DIAGNOSIS — Z992 Dependence on renal dialysis: Secondary | ICD-10-CM | POA: Diagnosis not present

## 2020-11-10 DIAGNOSIS — I120 Hypertensive chronic kidney disease with stage 5 chronic kidney disease or end stage renal disease: Secondary | ICD-10-CM | POA: Diagnosis present

## 2020-11-10 DIAGNOSIS — Z833 Family history of diabetes mellitus: Secondary | ICD-10-CM | POA: Diagnosis not present

## 2020-11-10 DIAGNOSIS — I161 Hypertensive emergency: Secondary | ICD-10-CM | POA: Diagnosis present

## 2020-11-10 DIAGNOSIS — K3184 Gastroparesis: Secondary | ICD-10-CM | POA: Diagnosis not present

## 2020-11-10 DIAGNOSIS — Z8616 Personal history of COVID-19: Secondary | ICD-10-CM | POA: Diagnosis not present

## 2020-11-10 DIAGNOSIS — E1010 Type 1 diabetes mellitus with ketoacidosis without coma: Principal | ICD-10-CM | POA: Diagnosis present

## 2020-11-10 DIAGNOSIS — R112 Nausea with vomiting, unspecified: Secondary | ICD-10-CM | POA: Diagnosis present

## 2020-11-10 DIAGNOSIS — Z8249 Family history of ischemic heart disease and other diseases of the circulatory system: Secondary | ICD-10-CM | POA: Diagnosis not present

## 2020-11-10 DIAGNOSIS — N186 End stage renal disease: Secondary | ICD-10-CM | POA: Diagnosis present

## 2020-11-10 LAB — BASIC METABOLIC PANEL
Anion gap: 16 — ABNORMAL HIGH (ref 5–15)
Anion gap: 15 (ref 5–15)
BUN: 52 mg/dL — ABNORMAL HIGH (ref 6–20)
BUN: 52 mg/dL — ABNORMAL HIGH (ref 6–20)
CO2: 20 mmol/L — ABNORMAL LOW (ref 22–32)
CO2: 18 mmol/L — ABNORMAL LOW (ref 22–32)
Calcium: 9.2 mg/dL (ref 8.9–10.3)
Calcium: 9.4 mg/dL (ref 8.9–10.3)
Chloride: 102 mmol/L (ref 98–111)
Chloride: 104 mmol/L (ref 98–111)
Creatinine, Ser: 8.18 mg/dL — ABNORMAL HIGH (ref 0.44–1.00)
Creatinine, Ser: 8.52 mg/dL — ABNORMAL HIGH (ref 0.44–1.00)
GFR, Estimated: 6 mL/min — ABNORMAL LOW (ref >60)
GFR, Estimated: 6 mL/min — ABNORMAL LOW (ref >60)
Glucose, Bld: 279 mg/dL — ABNORMAL HIGH (ref 70–99)
Glucose, Bld: 295 mg/dL — ABNORMAL HIGH (ref 70–99)
Potassium: 4.4 mmol/L (ref 3.5–5.1)
Potassium: 4.2 mmol/L (ref 3.5–5.1)
Sodium: 138 mmol/L (ref 135–145)
Sodium: 137 mmol/L (ref 135–145)

## 2020-11-10 LAB — CBC
HCT: 33.3 % — ABNORMAL LOW (ref 36.0–46.0)
Hemoglobin: 10.9 g/dL — ABNORMAL LOW (ref 12.0–15.0)
MCH: 26.7 pg (ref 26.0–34.0)
MCHC: 32.7 g/dL (ref 30.0–36.0)
MCV: 81.4 fL (ref 80.0–100.0)
Platelets: 400 10*3/uL (ref 150–400)
RBC: 4.09 MIL/uL (ref 3.87–5.11)
RDW: 14.3 % (ref 11.5–15.5)
WBC: 11.6 10*3/uL — ABNORMAL HIGH (ref 4.0–10.5)
nRBC: 0 % (ref 0.0–0.2)

## 2020-11-10 LAB — URINALYSIS, ROUTINE W REFLEX MICROSCOPIC
Bilirubin Urine: NEGATIVE
Glucose, UA: 500 mg/dL — AB
Ketones, ur: NEGATIVE mg/dL
Leukocytes,Ua: NEGATIVE
Nitrite: NEGATIVE
Protein, ur: >300 mg/dL — AB
Specific Gravity, Urine: 1.01 (ref 1.005–1.030)
pH: 7.5 (ref 5.0–8.0)

## 2020-11-10 LAB — BETA-HYDROXYBUTYRIC ACID: Beta-Hydroxybutyric Acid: 2.16 mmol/L — ABNORMAL HIGH (ref 0.05–0.27)

## 2020-11-10 LAB — COMPREHENSIVE METABOLIC PANEL
ALT: 13 U/L (ref 0–44)
AST: 15 U/L (ref 15–41)
Albumin: 4 g/dL (ref 3.5–5.0)
Alkaline Phosphatase: 70 U/L (ref 38–126)
Anion gap: 18 — ABNORMAL HIGH (ref 5–15)
BUN: 52 mg/dL — ABNORMAL HIGH (ref 6–20)
CO2: 20 mmol/L — ABNORMAL LOW (ref 22–32)
Calcium: 10.2 mg/dL (ref 8.9–10.3)
Chloride: 99 mmol/L (ref 98–111)
Creatinine, Ser: 8.54 mg/dL — ABNORMAL HIGH (ref 0.44–1.00)
GFR, Estimated: 6 mL/min — ABNORMAL LOW (ref >60)
Glucose, Bld: 253 mg/dL — ABNORMAL HIGH (ref 70–99)
Potassium: 4.3 mmol/L (ref 3.5–5.1)
Sodium: 137 mmol/L (ref 135–145)
Total Bilirubin: 0.4 mg/dL (ref 0.3–1.2)
Total Protein: 7.9 g/dL (ref 6.5–8.1)

## 2020-11-10 LAB — CBG MONITORING, ED
Glucose-Capillary: 226 mg/dL — ABNORMAL HIGH (ref 70–99)
Glucose-Capillary: 281 mg/dL — ABNORMAL HIGH (ref 70–99)
Glucose-Capillary: 326 mg/dL — ABNORMAL HIGH (ref 70–99)

## 2020-11-10 LAB — BLOOD GAS, VENOUS
Acid-base deficit: 4.1 mmol/L — ABNORMAL HIGH (ref 0.0–2.0)
Bicarbonate: 20.5 mmol/L (ref 20.0–28.0)
FIO2: 21
O2 Saturation: 85.8 %
Patient temperature: 37
pCO2, Ven: 37.6 mmHg — ABNORMAL LOW (ref 44.0–60.0)
pH, Ven: 7.356 (ref 7.250–7.430)
pO2, Ven: 55.4 mmHg — ABNORMAL HIGH (ref 32.0–45.0)

## 2020-11-10 LAB — RESP PANEL BY RT-PCR (FLU A&B, COVID) ARPGX2
Influenza A by PCR: NEGATIVE
Influenza B by PCR: NEGATIVE
SARS Coronavirus 2 by RT PCR: NEGATIVE

## 2020-11-10 LAB — HEMOGLOBIN A1C
Hgb A1c MFr Bld: 8.8 % — ABNORMAL HIGH (ref 4.8–5.6)
Mean Plasma Glucose: 205.86 mg/dL

## 2020-11-10 LAB — LIPASE, BLOOD: Lipase: 130 U/L — ABNORMAL HIGH (ref 11–51)

## 2020-11-10 LAB — PREGNANCY, URINE: Preg Test, Ur: NEGATIVE

## 2020-11-10 MED ORDER — HEPARIN SODIUM (PORCINE) 5000 UNIT/ML IJ SOLN
5000.0000 [IU] | Freq: Three times a day (TID) | INTRAMUSCULAR | Status: DC
  Administered 2020-11-10 – 2020-11-13 (×8): 5000 [IU] via SUBCUTANEOUS
  Filled 2020-11-10 (×8): qty 1

## 2020-11-10 MED ORDER — CARVEDILOL 25 MG PO TABS
25.0000 mg | ORAL_TABLET | Freq: Two times a day (BID) | ORAL | Status: DC
  Administered 2020-11-11 – 2020-11-13 (×5): 25 mg via ORAL
  Filled 2020-11-10 (×6): qty 1

## 2020-11-10 MED ORDER — DEXTROSE IN LACTATED RINGERS 5 % IV SOLN: INTRAVENOUS | Status: DC

## 2020-11-10 MED ORDER — INSULIN ASPART 100 UNIT/ML IJ SOLN: 0.0000 [IU] | Freq: Four times a day (QID) | INTRAMUSCULAR | Status: DC | PRN

## 2020-11-10 MED ORDER — DEXTROSE-NACL 5-0.9 % IV SOLN: INTRAVENOUS | Status: DC

## 2020-11-10 MED ORDER — ONDANSETRON 4 MG PO TBDP
4.0000 mg | ORAL_TABLET | Freq: Once | ORAL | Status: AC
  Administered 2020-11-10: 4 mg via ORAL
  Filled 2020-11-10: qty 1

## 2020-11-10 MED ORDER — HYDRALAZINE HCL 50 MG PO TABS
100.0000 mg | ORAL_TABLET | Freq: Three times a day (TID) | ORAL | Status: DC
  Administered 2020-11-10 – 2020-11-13 (×8): 100 mg via ORAL
  Filled 2020-11-10 (×7): qty 2

## 2020-11-10 MED ORDER — DEXTROSE 50 % IV SOLN: 0.0000 mL | INTRAVENOUS | Status: DC | PRN

## 2020-11-10 MED ORDER — ACETAMINOPHEN 650 MG RE SUPP: 650.0000 mg | Freq: Four times a day (QID) | RECTAL | Status: DC | PRN

## 2020-11-10 MED ORDER — PANTOPRAZOLE SODIUM 40 MG PO TBEC
40.0000 mg | DELAYED_RELEASE_TABLET | Freq: Every day | ORAL | Status: DC
  Administered 2020-11-11 – 2020-11-13 (×3): 40 mg via ORAL
  Filled 2020-11-10: qty 1
  Filled 2020-11-10: qty 2
  Filled 2020-11-10: qty 1

## 2020-11-10 MED ORDER — LACTATED RINGERS IV BOLUS
500.0000 mL | Freq: Once | INTRAVENOUS | Status: AC
  Administered 2020-11-10: 500 mL via INTRAVENOUS

## 2020-11-10 MED ORDER — ONDANSETRON HCL 4 MG/2ML IJ SOLN
4.0000 mg | Freq: Four times a day (QID) | INTRAMUSCULAR | Status: DC | PRN
  Administered 2020-11-11 – 2020-11-12 (×3): 4 mg via INTRAVENOUS
  Filled 2020-11-10 (×3): qty 2

## 2020-11-10 MED ORDER — LACTATED RINGERS IV SOLN: INTRAVENOUS | Status: DC

## 2020-11-10 MED ORDER — METOCLOPRAMIDE HCL 5 MG/ML IJ SOLN
10.0000 mg | Freq: Once | INTRAMUSCULAR | Status: AC
  Administered 2020-11-10: 10 mg via INTRAVENOUS
  Filled 2020-11-10: qty 2

## 2020-11-10 MED ORDER — ONDANSETRON HCL 4 MG/2ML IJ SOLN
4.0000 mg | Freq: Once | INTRAMUSCULAR | Status: AC | PRN
  Administered 2020-11-10: 4 mg via INTRAVENOUS
  Filled 2020-11-10: qty 2

## 2020-11-10 MED ORDER — PROMETHAZINE HCL 25 MG/ML IJ SOLN
INTRAMUSCULAR | Status: AC
  Filled 2020-11-10: qty 1

## 2020-11-10 MED ORDER — HYDRALAZINE HCL 20 MG/ML IJ SOLN
10.0000 mg | Freq: Once | INTRAMUSCULAR | Status: AC
  Administered 2020-11-10: 10 mg via INTRAVENOUS
  Filled 2020-11-10: qty 1

## 2020-11-10 MED ORDER — LOSARTAN POTASSIUM 50 MG PO TABS
100.0000 mg | ORAL_TABLET | Freq: Every day | ORAL | Status: DC
  Administered 2020-11-11 – 2020-11-13 (×3): 100 mg via ORAL
  Filled 2020-11-10 (×4): qty 2

## 2020-11-10 MED ORDER — INSULIN GLARGINE-YFGN 100 UNIT/ML ~~LOC~~ SOLN
16.0000 [IU] | Freq: Every day | SUBCUTANEOUS | Status: DC
  Administered 2020-11-11: 16 [IU] via SUBCUTANEOUS
  Filled 2020-11-10 (×2): qty 0.16

## 2020-11-10 MED ORDER — INSULIN ASPART 100 UNIT/ML IJ SOLN
5.0000 [IU] | Freq: Once | INTRAMUSCULAR | Status: AC
  Administered 2020-11-10: 5 [IU] via SUBCUTANEOUS

## 2020-11-10 MED ORDER — PROMETHAZINE HCL 25 MG/ML IJ SOLN
INTRAMUSCULAR | Status: AC
  Administered 2020-11-10: 25 mg via INTRAVENOUS
  Filled 2020-11-10: qty 1

## 2020-11-10 MED ORDER — SODIUM CHLORIDE 0.9 % IV BOLUS
1000.0000 mL | Freq: Once | INTRAVENOUS | Status: AC
  Administered 2020-11-10: 1000 mL via INTRAVENOUS

## 2020-11-10 MED ORDER — ACETAMINOPHEN 325 MG PO TABS: 650.0000 mg | ORAL_TABLET | Freq: Four times a day (QID) | ORAL | Status: DC | PRN

## 2020-11-10 MED ORDER — INSULIN REGULAR(HUMAN) IN NACL 100-0.9 UT/100ML-% IV SOLN
INTRAVENOUS | Status: DC
  Filled 2020-11-10: qty 100

## 2020-11-10 MED ORDER — SODIUM CHLORIDE 0.9 % IV SOLN: Freq: Once | INTRAVENOUS | Status: AC

## 2020-11-10 MED ORDER — METOPROLOL TARTRATE 5 MG/5ML IV SOLN
5.0000 mg | Freq: Once | INTRAVENOUS | Status: AC
  Administered 2020-11-10: 5 mg via INTRAVENOUS
  Filled 2020-11-10: qty 5

## 2020-11-10 MED ORDER — RENA-VITE PO TABS
1.0000 | ORAL_TABLET | Freq: Every day | ORAL | Status: DC
  Administered 2020-11-10 – 2020-11-12 (×3): 1 via ORAL
  Filled 2020-11-10 (×3): qty 1

## 2020-11-10 MED ORDER — ONDANSETRON HCL 4 MG PO TABS: 4.0000 mg | ORAL_TABLET | Freq: Four times a day (QID) | ORAL | Status: DC | PRN

## 2020-11-10 MED ORDER — SODIUM CHLORIDE 0.9 % IV SOLN
12.5000 mg | Freq: Four times a day (QID) | INTRAVENOUS | Status: DC | PRN
  Administered 2020-11-10: 12.5 mg via INTRAVENOUS
  Filled 2020-11-10: qty 0.5

## 2020-11-10 MED ORDER — AMLODIPINE BESYLATE 10 MG PO TABS
10.0000 mg | ORAL_TABLET | Freq: Every day | ORAL | Status: DC
  Administered 2020-11-11 – 2020-11-13 (×3): 10 mg via ORAL
  Filled 2020-11-10 (×3): qty 1

## 2020-11-10 NOTE — ED Triage Notes (Signed)
Emesis, episodes x 5 onset today; afebrile. Son also sick w/fever.

## 2020-11-10 NOTE — ED Provider Notes (Signed)
[facility] EMERGENCY DEPT Provider Note   CSN: [id number] Arrival date & time: 11/10/20  1425     History Chief Complaint  Patient presents with   Emesis    Barbara Donovan is a 30 y.o. female with history of chronic kidney disease on home peritoneal dialysis, tunneled right chest wall catheter diabetic retinopathy, type 1 diabetes on insulin, presented emerge apartment nausea, vomiting, diarrhea.  Patient reports onset of symptoms this morning.  She reports that her 11-year-old son who is also a patient here in the ED came home from daycare with a fever today.  He has not had any diarrhea or any other localizing symptoms.  She denies any other sick contacts in the house.  She reports she cannot keep anything down.  She reports diffuse cramping abdominal pain.  HPI     Past Medical History:  Diagnosis Date   Asthma    as a child, no problems as an adult, no inhaler   Cataract    NS OU   Chronic hypertension during pregnancy, antepartum 08/19/2017   Dehydration 01/28/2018   Depression during pregnancy, antepartum 07/07/2017   6/20: Short trial of zoloft previously, reports didn't help much but also didn't give it a chance Discussed r/b/a SSRIs in pregnancy, agrees to try Zoloft again, rx sent No SI/HI/red flags   Diabetes ([place])    TYPE I. A1C 7.5% 05/31/20   Diabetic retinopathy ([place]) 06/09/2017   07/2017 with bilateral severe diabetic non-proliferative retinopathy with macular edema.   ESRD (end stage renal disease) on dialysis ([place])    4 x week  M T TH F   HTN (hypertension)    Hypertensive retinopathy    OU   Hypokalemia 01/22/2018   Hypomagnesemia 01/28/2018   Intractable nausea and vomiting 01/22/2018   Intrauterine growth restriction (IUGR) affecting care of mother 12/22/2017   Morbid obesity ([place])    Nephropathy, diabetic ([place]) 12/29/2017   Proteinuria due to type 1 diabetes mellitus ([place]) 11/02/2017   Baseline Pr: Cr 10.23   Severe hyperemesis  gravidarum 10/30/2017   Type I diabetes mellitus ([place]) 07/07/2017   Current Diabetic Medications:  Insulin  [x]  Aspirin 81 mg daily after 12 weeks (? A2/B GDM)  Required Referrals for A1GDM or A2GDM: [x]  Diabetes Education and Testing Supplies [x]  Nutrition Cousult  For A2/B GDM or higher classes of DM [x]  Diabetes Education and Testing Supplies [x]  Nutrition Counsult [x]  Fetal ECHO after 20 weeks  [x]  Eye exam for retina evaluation - severe retinopathy 7/19  Base   Ventricular septal defect (VSD) of fetus in singleton pregnancy, antepartum 09/30/2017   May go to newborn nursery per Dr. Lenard Simmer Echo prior to discharge    Patient Active Problem List   Diagnosis Date Noted   DKA, type 1 ([place]) 11/10/2020   Type 1 diabetes mellitus with hyperglycemia ([place]) 11/10/2020   ESRD (end stage renal disease) ([place]) 11/10/2020   Renal failure (ARF), acute on chronic ([place]) 06/01/2020   ARF (acute renal failure) (HCC)    Nausea & vomiting    Diabetic gastroparesis ([place]) 03/04/2020   Anemia due to stage 5 chronic kidney disease ([place]) 03/04/2020   Diabetic ketoacidosis without coma associated with type 1 diabetes mellitus ([place]) 02/09/2020   Hypothermia 01/24/2020   DKA (diabetic ketoacidosis) ([place]) 09/14/2019   COVID-19 virus infection 09/14/2019   SOB (shortness of breath) 03/27/2018   Hypomagnesemia 01/28/2018   Dehydration 01/28/2018   Intractable nausea and vomiting 01/22/2018   Hypertensive urgency 01/22/2018  Hypokalemia 01/22/2018   Nephropathy, diabetic ([place]) 12/29/2017   Proteinuria due to type 1 diabetes mellitus ([place]) 11/02/2017   Chronic hypertension 08/21/2017   Uncontrolled type 1 diabetes mellitus with stage 5 chronic kidney disease not on chronic dialysis, with long-term current use of insulin 07/07/2017   Diabetic retinopathy ([place]) 06/09/2017   Acute depression 11/02/2014   Asthma 10/30/2013    Past Surgical History:  Procedure Laterality Date   25 GAUGE PARS PLANA VITRECTOMY WITH  20 GAUGE MVR PORT FOR MACULAR HOLE Left 07/20/2018   Procedure: 25 GAUGE PARS PLANA VITRECTOMY LEFT EYE;  Surgeon: Bernarda Caffey, MD;  Location: [facility];  Service: Ophthalmology;  Laterality: Left;   CAPD INSERTION N/A 09/10/2020   Procedure: LAPAROSCOPIC INSERTION CONTINUOUS AMBULATORY PERITONEAL DIALYSIS  (CAPD) CATHETER;  Surgeon: Serafina Mitchell, MD;  Location: [facility];  Service: Vascular;  Laterality: N/A;   CESAREAN SECTION N/A 12/26/2017   Procedure: CESAREAN SECTION;  Surgeon: Osborne Oman, MD;  Location: [facility];  Service: Obstetrics;  Laterality: N/A;   EYE EXAMINATION UNDER ANESTHESIA Right 07/20/2018   Procedure: Eye Exam Under Anesthesia RIGHT EYE;  Surgeon: Bernarda Caffey, MD;  Location: [facility];  Service: Ophthalmology;  Laterality: Right;   EYE SURGERY Left 07/2018   GAS INSERTION Left 07/19/2019   Procedure: INSERTION OF GAS;  Surgeon: Bernarda Caffey, MD;  Location: [facility];  Service: Ophthalmology;  Laterality: Left;  SF6   INJECTION OF SILICONE OIL Left 03/24/6281   Procedure: Injection Of Silicone Oil LEFT EYE;  Surgeon: Bernarda Caffey, MD;  Location: [facility];  Service: Ophthalmology;  Laterality: Left;   IR FLUORO GUIDE CV LINE RIGHT  06/02/2020   IR US GUIDE VASC ACCESS RIGHT  06/02/2020   LASER PHOTO ABLATION Right 07/20/2018   Procedure: Laser Photo Ablation RIGHT EYE;  Surgeon: Bernarda Caffey, MD;  Location: [facility];  Service: Ophthalmology;  Laterality: Right;   MEMBRANE PEEL Left 07/20/2018   Procedure: Membrane Peel LEFT EYE;  Surgeon: Bernarda Caffey, MD;  Location: [facility];  Service: Ophthalmology;  Laterality: Left;   MEMBRANE PEEL Left 07/19/2019   Procedure: MEMBRANE PEEL;  Surgeon: Bernarda Caffey, MD;  Location: [facility];  Service: Ophthalmology;  Laterality: Left;   MITOMYCIN C APPLICATION Bilateral 01/23/1759   Procedure: Avastin Application;  Surgeon: Bernarda Caffey, MD;  Location: [facility];  Service: Ophthalmology;  Laterality: Bilateral;   PHOTOCOAGULATION WITH LASER Left 07/20/2018    Procedure: Photocoagulation With Laser LEFT EYE;  Surgeon: Bernarda Caffey, MD;  Location: [facility];  Service: Ophthalmology;  Laterality: Left;   PHOTOCOAGULATION WITH LASER Left 07/19/2019   Procedure: PHOTOCOAGULATION WITH LASER;  Surgeon: Bernarda Caffey, MD;  Location: [facility];  Service: Ophthalmology;  Laterality: Left;   RETINAL DETACHMENT SURGERY Left 07/20/2018   Dr. Bernarda Caffey   SILICON OIL REMOVAL Left 07/19/2019   Procedure: 25g PARS PLANA VITRECTOMY WITH SILICON OIL REMOVAL;  Surgeon: Bernarda Caffey, MD;  Location: [facility];  Service: Ophthalmology;  Laterality: Left;   WISDOM TOOTH EXTRACTION       OB History     Gravida  1   Para  1   Term      Preterm  1   AB      Living  1      SAB      IAB      Ectopic      Multiple  0   Live Births  1  Family History  Problem Relation Age of Onset   Diabetes Mother    Aneurysm Mother 55   Seizures Mother    Diabetes Father    Cataracts Father    COPD Father    Heart attack Father    Heart disease Father    Healthy Sister    Healthy Daughter    Stroke Maternal Grandfather    Amblyopia Neg Hx    Blindness Neg Hx    Glaucoma Neg Hx    Macular degeneration Neg Hx    Retinal detachment Neg Hx    Strabismus Neg Hx    Retinitis pigmentosa Neg Hx    Colon cancer Neg Hx    Stomach cancer Neg Hx    Esophageal cancer Neg Hx    Pancreatic cancer Neg Hx    Liver disease Neg Hx     Social History   Tobacco Use   Smoking status: Never   Smokeless tobacco: Never  Vaping Use   Vaping Use: Never used  Substance Use Topics   Alcohol use: Not Currently   Drug use: Never    Home Medications Prior to Admission medications   Medication Sig Start Date End Date Taking? Authorizing Provider  amLODipine (NORVASC) 10 MG tablet Take 1 tablet (10 mg total) by mouth daily. 03/09/20   Mercy Riding, MD  carvedilol (COREG) 25 MG tablet Take 1 tablet (25 mg total) by mouth 2 (two) times daily. 03/07/19   Imogene Burn,  PA-C  Continuous Blood Gluc Sensor MISC 1 each by Does not apply route as directed. Use as directed every 14 days. May dispense FreeStyle [employer] or similar. 01/27/20   Antonieta Pert, MD  hydrALAZINE (APRESOLINE) 50 MG tablet Take 2 tablets (100 mg total) by mouth 3 (three) times daily. 06/09/20   Geradine Girt, DO  HYDROcodone-acetaminophen (NORCO/VICODIN) 5-325 MG tablet Take 1 tablet by mouth every 4 (four) hours as needed for moderate pain. 09/10/20 09/10/21  Setzer, Edman Circle, PA-C  insulin glargine (LANTUS SOLOSTAR) 100 UNIT/ML Solostar Pen Inject 16 Units into the skin every morning. 11/06/20   Renato Shin, MD  insulin lispro (HUMALOG KWIKPEN) 100 UNIT/ML KwikPen Inject 2 Units into the skin 3 (three) times daily with meals. 04/25/20   Renato Shin, MD  LORazepam (ATIVAN) 0.5 MG tablet Take 0.5-1 mg by mouth daily as needed for nausea. 08/21/20   [provider]  losartan (COZAAR) 50 MG tablet Take 2 tablets (100 mg total) by mouth daily. 06/09/20   Geradine Girt, DO  metoCLOPramide (REGLAN) 5 MG tablet Take 1 tablet (5 mg total) by mouth 4 (four) times daily -  before meals and at bedtime. 08/28/20   Levin Erp, PA  multivitamin (RENA-VIT) TABS tablet Take 1 tablet by mouth at bedtime. 03/08/20   Mercy Riding, MD  ondansetron (ZOFRAN ODT) 4 MG disintegrating tablet Take 1 tablet (4 mg total) by mouth every 8 (eight) hours as needed for nausea or vomiting. 08/02/20   Deno Etienne, DO  pantoprazole (PROTONIX) 40 MG tablet Take 1 tablet (40 mg total) by mouth daily. 01/31/20   Ladene Artist, MD  promethazine (PHENERGAN) 25 MG suppository Place 1 suppository (25 mg total) rectally every 6 (six) hours as needed for nausea or vomiting. 08/02/20   Deno Etienne, DO    Allergies    Patient has no known allergies.  Review of Systems   Review of Systems  Constitutional:  Positive for appetite change, chills,  fatigue and fever.  HENT:  Negative for ear pain and sore throat.    Eyes:  Negative for pain and visual disturbance.  Respiratory:  Negative for cough and shortness of breath.   Cardiovascular:  Negative for chest pain and palpitations.  Gastrointestinal:  Positive for abdominal pain, diarrhea, nausea and vomiting.  Genitourinary:  Negative for dysuria and hematuria.  Musculoskeletal:  Negative for arthralgias and back pain.  Skin:  Negative for color change and rash.  Neurological:  Negative for syncope and headaches.  All other systems reviewed and are negative.  Physical Exam Updated Vital Signs BP (!) 144/76   Pulse (!) 104   Temp 98.6 F (37 C) (Oral)   Resp 20   Ht 5\' 6"  (1.676 m)   Wt 64.6 kg   SpO2 96%   Breastfeeding No   BMI 22.99 kg/m   Physical Exam Constitutional:      General: She is not in acute distress.    Comments: Dry heaving  HENT:     Head: Normocephalic and atraumatic.  Eyes:     Conjunctiva/sclera: Conjunctivae normal.     Pupils: Pupils are equal, round, and reactive to light.  Cardiovascular:     Rate and Rhythm: Normal rate and regular rhythm.     Pulses: Normal pulses.  Pulmonary:     Effort: Pulmonary effort is normal. No respiratory distress.  Abdominal:     General: There is no distension.     Tenderness: There is no abdominal tenderness.  Musculoskeletal:     Comments: Dialysis catheters without surrounding inflamation  Skin:    General: Skin is warm and dry.  Neurological:     General: No focal deficit present.     Mental Status: She is alert and oriented to person, place, and time. Mental status is at baseline.  Psychiatric:        Mood and Affect: Mood normal.        Behavior: Behavior normal.    ED Results / Procedures / Treatments   Labs (all labs ordered are listed, but only abnormal results are displayed) Labs Reviewed  LIPASE, BLOOD - Abnormal; Notable for the following components:      Result Value   Lipase 130 (*)    All other components within normal limits  COMPREHENSIVE  METABOLIC PANEL - Abnormal; Notable for the following components:   CO2 20 (*)    Glucose, Bld 253 (*)    BUN 52 (*)    Creatinine, Ser 8.54 (*)    GFR, Estimated 6 (*)    Anion gap 18 (*)    All other components within normal limits  CBC - Abnormal; Notable for the following components:   WBC 11.6 (*)    Hemoglobin 10.9 (*)    HCT 33.3 (*)    All other components within normal limits  URINALYSIS, ROUTINE W REFLEX MICROSCOPIC - Abnormal; Notable for the following components:   Glucose, UA 500 (*)    Hgb urine dipstick TRACE (*)    Protein, ur >300 (*)    All other components within normal limits  BASIC METABOLIC PANEL - Abnormal; Notable for the following components:   CO2 20 (*)    Glucose, Bld 279 (*)    BUN 52 (*)    Creatinine, Ser 8.18 (*)    GFR, Estimated 6 (*)    Anion gap 16 (*)    All other components within normal limits  BETA-HYDROXYBUTYRIC ACID - Abnormal; Notable for the following  components:   Beta-Hydroxybutyric Acid 2.16 (*)    All other components within normal limits  BASIC METABOLIC PANEL - Abnormal; Notable for the following components:   CO2 18 (*)    Glucose, Bld 295 (*)    BUN 52 (*)    Creatinine, Ser 8.52 (*)    GFR, Estimated 6 (*)    All other components within normal limits  BLOOD GAS, VENOUS - Abnormal; Notable for the following components:   pCO2, Ven 37.6 (*)    pO2, Ven 55.4 (*)    Acid-base deficit 4.1 (*)    All other components within normal limits  HEMOGLOBIN A1C - Abnormal; Notable for the following components:   Hgb A1c MFr Bld 8.8 (*)    All other components within normal limits  CBG MONITORING, ED - Abnormal; Notable for the following components:   Glucose-Capillary 226 (*)    All other components within normal limits  CBG MONITORING, ED - Abnormal; Notable for the following components:   Glucose-Capillary 281 (*)    All other components within normal limits  CBG MONITORING, ED - Abnormal; Notable for the following components:    Glucose-Capillary 326 (*)    All other components within normal limits  RESP PANEL BY RT-PCR (FLU A&B, COVID) ARPGX2  CULTURE, GROUP A STREP ([facility])  PREGNANCY, URINE  BASIC METABOLIC PANEL  BASIC METABOLIC PANEL  BETA-HYDROXYBUTYRIC ACID  BASIC METABOLIC PANEL  BETA-HYDROXYBUTYRIC ACID  CBC    EKG None  Radiology No results found.  Procedures .Critical Care Performed by: Wyvonnia Dusky, MD Authorized by: Wyvonnia Dusky, MD   Critical care provider statement:    Critical care time (minutes):  45   Critical care time was exclusive of:  Separately billable procedures and treating other patients   Critical care was necessary to treat or prevent imminent or life-threatening deterioration of the following conditions:  Metabolic crisis   Critical care was time spent personally by me on the following activities:  Ordering and performing treatments and interventions, ordering and review of laboratory studies, ordering and review of radiographic studies, pulse oximetry, review of old charts, examination of patient and evaluation of patient's response to treatment   Medications Ordered in ED Medications  promethazine (PHENERGAN) 12.5 mg in sodium chloride 0.9 % 50 mL IVPB (12.5 mg Intravenous New Bag/Given 11/10/20 1849)  promethazine (PHENERGAN) 25 MG/ML injection (  Canceled Entry 11/10/20 2053)  amLODipine (NORVASC) tablet 10 mg (has no administration in time range)  carvedilol (COREG) tablet 25 mg (has no administration in time range)  losartan (COZAAR) tablet 100 mg (has no administration in time range)  hydrALAZINE (APRESOLINE) tablet 100 mg (100 mg Oral Given 11/10/20 2256)  insulin glargine (LANTUS) Solostar Pen 16 Units (has no administration in time range)  pantoprazole (PROTONIX) EC tablet 40 mg (has no administration in time range)  multivitamin (RENA-VIT) tablet 1 tablet (1 tablet Oral Given 11/10/20 2256)  insulin aspart (novoLOG) injection 0-9 Units (has no  administration in time range)  heparin injection 5,000 Units (5,000 Units Subcutaneous Given 11/10/20 2252)  acetaminophen (TYLENOL) tablet 650 mg (has no administration in time range)    Or  acetaminophen (TYLENOL) suppository 650 mg (has no administration in time range)  ondansetron (ZOFRAN) tablet 4 mg (has no administration in time range)    Or  ondansetron (ZOFRAN) injection 4 mg (has no administration in time range)  ondansetron (ZOFRAN-ODT) disintegrating tablet 4 mg (4 mg Oral Given 11/10/20 1447)  ondansetron (ZOFRAN) injection  4 mg (4 mg Intravenous Given 11/10/20 1549)  sodium chloride 0.9 % bolus 1,000 mL (0 mLs Intravenous Stopped 11/10/20 1700)  promethazine (PHENERGAN) 25 MG/ML injection (25 mg Intravenous (Continuous Infusion) Given 11/10/20 1641)  hydrALAZINE (APRESOLINE) injection 10 mg (10 mg Intravenous Given 11/10/20 1714)  0.9 %  sodium chloride infusion (0 mLs Intravenous Stopped 11/10/20 2241)  hydrALAZINE (APRESOLINE) injection 10 mg (10 mg Intravenous Given 11/10/20 1913)  insulin aspart (novoLOG) injection 5 Units (5 Units Subcutaneous Given 11/10/20 1911)  metoCLOPramide (REGLAN) injection 10 mg (10 mg Intravenous Given 11/10/20 1925)  metoprolol tartrate (LOPRESSOR) injection 5 mg (5 mg Intravenous Given 11/10/20 2249)  insulin aspart (novoLOG) injection 5 Units (5 Units Subcutaneous Given 11/10/20 2250)  lactated ringers bolus 500 mL (500 mLs Intravenous New Bag/Given 11/10/20 2243)    ED Course  I have reviewed the triage vital signs and the nursing notes.  Pertinent labs & imaging results that were available during my care of the patient were reviewed by me and considered in my medical decision making (see chart for details).  Patient is here with nausea, vomiting, diarrhea.  This is likely viral gastroenteritis.  She does also have some elevations of her lipase levels, which may be consistent with a mild pancreatitis, but no transaminitis elevation to  suggest acute biliary disease.  She has significantly worsening AKI, with creatinine 8.5 and BUN also elevated 52, suggesting prerenal injury likely related to dehydration.  We will give some IV fluids, 1L to begin.  Anion gap of 18, may close with fluids alone - glucose 200's on initial check.  No ketones in Urine.  She will need admission to the hospital.  Patient still vomiting after Zofran, have ordered some IV Phenergan.  Overall abdominal exam is benign, no focal tenderness or significant leukocytosis to suggest SBP.  She does not meet sirs or sepsis criteria at this time.  I do suspect this is a viral illness given that she has nausea vomiting and diarrhea no sick contacts in the house.  I have ordered a single liter of fluids and will need to proceed judiciously given her dialysis.  I have also started on insulin infusion for elevated anion gap and hyperglycemia given that she is type I diabetic.  Clinical Course as of 11/10/20 [place] Nov 10, 2020  1628 BUN(!): 52 [MT]  1628 Creatinine(!): 8.54 [MT]  1716 Patient admitted to hospitalist at [facility].  I do not believe she needs emergent dialysis at this time, however she would benefit from nephrology evaluation and likely will need dialysis in the hospital.  Her potassium levels within normal limits.  We will start her on the Endo tool for elevated anion gap and possible early diabetic ketosis, I will hold on giving her any potassium given that she has end-stage renal disease. [MT]  9373 I discussed her lipase elevation with the hospitalist, who will be trending her repeating this level on the inpatient side.  I do not believe the patient needs an emergent CT scan at this time as she is not likely to have an acute abdominal surgical emergency, however if her clinical presentation changes or if the hospitalist feels otherwise, a CT scan could be obtained at a later time. [MT]  4287 I have also ordered 1 dose of IV hydralazine which is the  patient's home oral blood pressure medication, given that she cannot keep down pills. [MT]  1857 Anion gap improved to 16 prior to initiation of endotool -with  her lower blood sugars I think be reasonable discontinue the other thought this point, severe now volume overloading her with d5 infusions [MT]  1922 Worsening vomiting despite repeat dose of Phenergan, continues to be nauseated.  Have ordered IV Reglan.  She does have a history of gastroparesis. [MT]    Clinical Course User Index [MT] Vrinda Heckstall, Carola Rhine, MD    Final Clinical Impression(s) / ED Diagnoses Final diagnoses:  Nausea and vomiting, unspecified vomiting type  Hyperglycemia  AKI (acute kidney injury) [facility])    Rx / DC Orders ED Discharge Orders     None        Wyvonnia Dusky, MD 11/10/20 2345

## 2020-11-10 NOTE — Progress Notes (Addendum)
30 year old with past medical history significant for type I diabetic, ESRD on peritoneal dialysis, hypertension, presents with persistent nausea vomiting and diarrhea.  Sick contact, her kid is sick with fever at home. Found to have elevation of blood pressure systolic blood pressure 122, she will receive IV hydralazine.  She was found to have probably early DKA.  Plan to start insulin drip.  She will need B-Met Q 4 hours. Will need to be careful with IV fluids due to renal failure.  She will be accepted to progressive bed at [facility]  She will need nephrology consultation.  She has mild elevation of lipase, this will need to be repeated.

## 2020-11-10 NOTE — ED Notes (Signed)
Handoff report given to [facility] and Management consultant on [address] [place]

## 2020-11-10 NOTE — H&P (Signed)
History and Physical    Barbara Donovan OIT:[id number] DOB: [birth year] DOA: 11/10/2020  PCP: Fanny Bien, MD   Patient coming from: Home via [facility]  Chief Complaint: nausea and vomiting  HPI: Barbara Donovan is a 30 y.o. female with medical history significant for end stage kidney disease on home peritoneal dialysis, tunneled right chest wall catheter diabetic retinopathy, type 1 diabetes on insulin, presented emerge apartment nausea, vomiting that this morning when she woke up.  She reports that her 50-year-old son also had similar symptoms with fever.  They are both in the emergency room.  COVID test was negative in the emergency room but no strep test was performed according to this [place].  She reports she had diffuse intermittent abdominal cramping this morning but none now.  She reports she continues to have nausea and vomiting and unable to tolerate p.o. intake.  She has not had any recent travel and no known sick contacts except for her 22-year-old son.  Denies tobacco, alcohol or illicit drug use.   ED Course: Ms. Joerger has had tachycardia elevated blood pressure since arrival in the emergency room.  Has been given multiple doses of IV antihypertensives.  Been admitted to progressive care unit.  Continue to monitor closely.  Blood gas was not obtained in the emergency room.  She does have elevated blood sugar in the 2 50-3 50 range with a mildly elevated anion gap beta hydroxybutyrate was ordered but has not been obtained.  Patient was not put on insulin infusion or high IV fluids secondary to end-stage renal disease with peritoneal dialysis.  Blood sugars now improved to 245 after receiving 5 units of subcu insulin.  Hospitalist service was asked to admit for further management.  We will provide gentle IV fluid hydration overnight.  Will provide subcu insulin now.  Consult nephrology   Review of Systems:  General: Denies fever, chills, weight loss, night sweats.  Denies  dizziness.  Denies change in appetite HENT: Denies head trauma, headache, denies change in hearing, tinnitus.  Denies nasal congestion or bleeding.  Denies sore throat, sores in mouth.  Denies difficulty swallowing Eyes: Denies blurry vision, pain in eye, drainage.  Denies discoloration of eyes. Neck: Denies pain.  Denies swelling.  Denies pain with movement. Cardiovascular: Denies chest pain, palpitations.  Denies edema.  Denies orthopnea Respiratory: Denies shortness of breath, cough.  Denies wheezing.  Denies sputum production Gastrointestinal: Denies abdominal pain, swelling.  Denies diarrhea.  Denies melena.  Denies hematemesis. Musculoskeletal: Denies limitation of movement.  Denies deformity or swelling. Denies arthralgias or myalgias. Genitourinary: Denies pelvic pain.  Denies urinary frequency or hesitancy.  Denies dysuria.  Skin: Denies rash.  Denies petechiae, purpura, ecchymosis. Neurological: Denies syncope.  Denies seizure activity. Denies paresthesia. Denies slurred speech, drooping face. Denies visual change. Psychiatric: Denies depression, anxiety. Denies hallucinations.  Past Medical History:  Diagnosis Date   Asthma    as a child, no problems as an adult, no inhaler   Cataract    NS OU   Chronic hypertension during pregnancy, antepartum 08/19/2017   Dehydration 01/28/2018   Depression during pregnancy, antepartum 07/07/2017   6/20: Short trial of zoloft previously, reports didn't help much but also didn't give it a chance Discussed r/b/a SSRIs in pregnancy, agrees to try Zoloft again, rx sent No SI/HI/red flags   Diabetes ([place])    TYPE I. A1C 7.5% 05/31/20   Diabetic retinopathy ([place]) 06/09/2017   07/2017 with bilateral severe diabetic non-proliferative retinopathy with  macular edema.   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 A2/B GDM)  Required Referrals for A1GDM or A2GDM: [x]  Diabetes Education and Testing Supplies [x]  Nutrition Cousult  For A2/B GDM or higher classes of DM [x]  Diabetes Education and Testing Supplies [x]  Nutrition Counsult [x]  Fetal ECHO after 20 weeks  [x]  Eye exam for retina evaluation - severe retinopathy 7/19  Base   Ventricular septal defect (VSD) of fetus in singleton pregnancy, antepartum 09/30/2017   May go to newborn nursery per Dr. Lenard Simmer Echo prior to discharge    Past Surgical History:  Procedure Laterality Date   25 GAUGE PARS PLANA VITRECTOMY WITH 20 GAUGE MVR PORT FOR MACULAR HOLE Left 07/20/2018   Procedure: 25 GAUGE PARS PLANA VITRECTOMY LEFT EYE;  Surgeon: Bernarda Caffey, MD;  Location: [facility];  Service: Ophthalmology;  Laterality: Left;   CAPD INSERTION N/A 09/10/2020   Procedure: LAPAROSCOPIC INSERTION CONTINUOUS AMBULATORY PERITONEAL DIALYSIS  (CAPD) CATHETER;  Surgeon: Serafina Mitchell, MD;  Location: [facility];  Service: Vascular;  Laterality: N/A;   CESAREAN SECTION N/A 12/26/2017   Procedure: CESAREAN SECTION;  Surgeon: Osborne Oman, MD;  Location: [facility];  Service: Obstetrics;  Laterality: N/A;   EYE EXAMINATION UNDER ANESTHESIA Right 07/20/2018   Procedure: Eye Exam Under Anesthesia RIGHT EYE;  Surgeon: Bernarda Caffey, MD;  Location: [facility];  Service: Ophthalmology;  Laterality: Right;   EYE SURGERY Left 07/2018   GAS INSERTION Left 07/19/2019   Procedure: INSERTION OF GAS;  Surgeon: Bernarda Caffey, MD;  Location: [facility];  Service:  Ophthalmology;  Laterality: Left;  SF6   INJECTION OF SILICONE OIL Left 02/21/5359   Procedure: Injection Of Silicone Oil LEFT EYE;  Surgeon: Bernarda Caffey, MD;  Location: [facility];  Service: Ophthalmology;  Laterality: Left;   IR FLUORO GUIDE CV LINE RIGHT  06/02/2020   IR US GUIDE VASC ACCESS RIGHT  06/02/2020   LASER PHOTO ABLATION Right 07/20/2018   Procedure: Laser Photo Ablation RIGHT EYE;  Surgeon: Bernarda Caffey, MD;  Location: [facility];  Service: Ophthalmology;  Laterality: Right;   MEMBRANE PEEL Left 07/20/2018   Procedure: Membrane Peel LEFT EYE;  Surgeon: Bernarda Caffey, MD;  Location: [facility];  Service: Ophthalmology;  Laterality: Left;   MEMBRANE PEEL Left 07/19/2019   Procedure: MEMBRANE PEEL;  Surgeon: Bernarda Caffey, MD;  Location: [facility];  Service: Ophthalmology;  Laterality: Left;   MITOMYCIN C APPLICATION Bilateral 04/21/3152   Procedure: Avastin Application;  Surgeon: Bernarda Caffey, MD;  Location: [facility];  Service: Ophthalmology;  Laterality: Bilateral;   PHOTOCOAGULATION WITH LASER Left 07/20/2018   Procedure: Photocoagulation With Laser LEFT EYE;  Surgeon: Bernarda Caffey, MD;  Location: [facility];  Service: Ophthalmology;  Laterality: Left;   PHOTOCOAGULATION WITH LASER Left 07/19/2019   Procedure: PHOTOCOAGULATION WITH LASER;  Surgeon: Bernarda Caffey, MD;  Location:  [facility];  Service: Ophthalmology;  Laterality: Left;   RETINAL DETACHMENT SURGERY Left 07/20/2018   Dr. Bernarda Caffey   SILICON OIL REMOVAL Left 07/19/2019   Procedure: 25g PARS PLANA VITRECTOMY WITH SILICON OIL REMOVAL;  Surgeon: Bernarda Caffey, MD;  Location: [facility];  Service: Ophthalmology;  Laterality: Left;   WISDOM TOOTH EXTRACTION      Social History  reports that she has never smoked. She has never used smokeless tobacco. She reports that she does not currently use alcohol. She reports that she does not use drugs.  No Known Allergies  Family History  Problem Relation Age of Onset   Diabetes Mother    Aneurysm Mother 68   Seizures  Mother    Diabetes Father    Cataracts Father    COPD Father    Heart attack Father    Heart disease Father    Healthy Sister    Healthy Daughter    Stroke Maternal Grandfather    Amblyopia Neg Hx    Blindness Neg Hx    Glaucoma Neg Hx    Macular degeneration Neg Hx    Retinal detachment Neg Hx    Strabismus Neg Hx    Retinitis pigmentosa Neg Hx    Colon cancer Neg Hx    Stomach cancer Neg Hx    Esophageal cancer Neg Hx    Pancreatic cancer Neg Hx    Liver disease Neg Hx      Prior to Admission medications   Medication Sig Start Date End Date Taking? Authorizing Provider  amLODipine (NORVASC) 10 MG tablet Take 1 tablet (10 mg total) by mouth daily. 03/09/20   Mercy Riding, MD  carvedilol (COREG) 25 MG tablet Take 1 tablet (25 mg total) by mouth 2 (two) times daily. 03/07/19   Imogene Burn, PA-C  Continuous Blood Gluc Sensor MISC 1 each by Does not apply route as directed. Use as directed every 14 days. May dispense FreeStyle [employer] or similar. 01/27/20   Antonieta Pert, MD  hydrALAZINE (APRESOLINE) 50 MG tablet Take 2 tablets (100 mg total) by mouth 3 (three) times daily. 06/09/20   Geradine Girt, DO  HYDROcodone-acetaminophen (NORCO/VICODIN) 5-325 MG tablet Take 1 tablet by mouth every 4 (four) hours as needed for moderate pain. 09/10/20 09/10/21  Setzer, Edman Circle, PA-C  insulin glargine (LANTUS SOLOSTAR) 100 UNIT/ML Solostar Pen Inject 16 Units into the skin every morning. 11/06/20   Renato Shin, MD  insulin lispro (HUMALOG KWIKPEN) 100 UNIT/ML KwikPen Inject 2 Units into the skin 3 (three) times daily with meals. 04/25/20   Renato Shin, MD  LORazepam (ATIVAN) 0.5 MG tablet Take 0.5-1 mg by mouth daily as needed for nausea. 08/21/20   [provider]  losartan (COZAAR) 50 MG tablet Take 2 tablets (100 mg total) by mouth daily. 06/09/20   Geradine Girt, DO  metoCLOPramide (REGLAN) 5 MG tablet Take 1 tablet (5 mg total) by mouth 4 (four) times daily -  before  meals and at bedtime. 08/28/20   Levin Erp, PA  multivitamin (RENA-VIT) TABS tablet Take 1 tablet by mouth at bedtime. 03/08/20   Mercy Riding, MD  ondansetron (ZOFRAN ODT) 4 MG disintegrating tablet Take 1 tablet (4 mg total) by mouth every 8 (eight) hours as needed for nausea or vomiting. 08/02/20   Deno Etienne, DO  pantoprazole (PROTONIX) 40 MG tablet Take 1 tablet (40 mg total) by mouth daily. 01/31/20   Ladene Artist, MD  promethazine (PHENERGAN) 25 MG suppository Place 1 suppository (25 mg total) rectally every 6 (six) hours as needed for nausea or vomiting. 08/02/20   Deno Etienne, DO    Physical Exam: Vitals:   11/10/20 1915 11/10/20 1930 11/10/20 1943 11/10/20 2053  BP: (!) 194/102 (!) 212/108  (!) 179/96  Pulse: (!) 114 (!) 107 (!) 115 (!) 112  Resp: (!) 21 18 20 20   Temp:   98.7 F (37.1 C) 98.6 F (37 C)  TempSrc:    Oral  SpO2: 99% 99% 99% 98%  Weight:    64.6 kg  Height:        Constitutional: NAD Vitals:   11/10/20 1915 11/10/20 1930 11/10/20 1943 11/10/20 2053  BP: (!) 194/102 (!) 212/108  (!) 179/96  Pulse: (!) 114 (!) 107 (!) 115 (!) 112  Resp: (!) 21 18 20 20   Temp:   98.7 F (37.1 C) 98.6 F (37 C)  TempSrc:    Oral  SpO2: 99% 99% 99% 98%  Weight:    64.6 kg  Height:       General: WDWN, tired and ill-appearing.  Alert and oriented x3.  Eyes: EOMI, PERRL, conjunctivae normal.  Sclera nonicteric HENT:  [place]/AT, external ears normal.  Nares patent without epistasis.  Mucous membranes are dry. Posterior pharynx clear Neck: Soft, normal range of motion, supple, no masses, no thyromegaly.  Trachea midline Respiratory: clear to auscultation bilaterally, no wheezing, no crackles. Normal respiratory effort. No accessory muscle use.  Cardiovascular: Regular rate and rhythm, no murmurs / rubs / gallops. No extremity edema. 2+ pedal pulses. Abdomen: Soft, no tenderness, nondistended, no rebound or guarding.  No masses palpated. Bowel sounds  normoactive Musculoskeletal: FROM. no cyanosis. No joint deformity upper and lower extremities. no contractures. Normal muscle tone.  Skin: Warm, dry, intact no rashes, lesions, ulcers. No induration.  Decreased skin turgor Neurologic: CN 2-12 grossly intact.  Normal speech.  Sensation intact. Strength 5/5 in all extremities.   Psychiatric: Normal judgment and insight.  Normal mood.    Labs on Admission: I have personally reviewed following labs and imaging studies  CBC: Recent Labs  Lab 11/10/20 1501  WBC 11.6*  HGB 10.9*  HCT 33.3*  MCV 81.4  PLT 502    Basic Metabolic Panel: Recent Labs  Lab 11/10/20 1501 11/10/20 1752  NA 137 138  K 4.3 4.4  CL 99 102  CO2 20* 20*  GLUCOSE 253* 279*  BUN 52* 52*  CREATININE 8.54* 8.18*  CALCIUM 10.2 9.2    GFR: Estimated Creatinine Clearance: 9.4 mL/min (A) (by C-G formula based on SCr of 8.18 mg/dL (H)).  Liver Function Tests: Recent Labs  Lab 11/10/20 1501  AST 15  ALT 13  ALKPHOS 70  BILITOT 0.4  PROT 7.9  ALBUMIN 4.0    Urine analysis:    Component Value Date/Time   COLORURINE YELLOW 11/10/2020 1501   APPEARANCEUR CLEAR 11/10/2020 1501   LABSPEC 1.010 11/10/2020 1501   PHURINE 7.5 11/10/2020 1501   GLUCOSEU 500 (A) 11/10/2020 1501   HGBUR TRACE (A) 11/10/2020 1501   BILIRUBINUR NEGATIVE 11/10/2020 1501   KETONESUR NEGATIVE 11/10/2020 1501   PROTEINUR >300 (A) 11/10/2020 1501   UROBILINOGEN 0.2 08/14/2019 1051   NITRITE NEGATIVE 11/10/2020 1501   LEUKOCYTESUR NEGATIVE 11/10/2020 1501    Radiological Exams on Admission: No results found.  Assessment/Plan Principal Problem:   Type 1 diabetes mellitus with hyperglycemia Ms. Lienhard has been admitted to progressive care unit.  Initially blood  sugar was 376 in the emergency room she was given 5 units of insulin subcu.  Reportedly the ER physician physician did not want to give IV fluids in large amounts with her history of peritoneal dialysis so was never  placed on insulin infusion.  Blood sugar had dropped to 279 waiting recheck her blood sugar at this time which will be 2 hours after insulin was given. Check BMP every 4 hours overnight Continue basal insulin.  Check blood sugars every 6 hours and provide corrective insulin as needed for glycemic control. Check hemoglobin A1c Is noted the patient's lipase level is elevated but she has no abdominal pain on palpation  Active Problems:   Hypertensive urgency She was given multiple doses of IV hydralazine in the emergency room with continued elevated blood pressure.  Will give 5 mg of metoprolol IV now.  Continue home medications of Cozaar and Coreg starting the morning.  Continue Norvasc for dose tonight.  Continue hydralazine 3 times a day.  Monitor blood pressure closely    ESRD (end stage renal disease)  Consult nephrology for assistance with dialysis in the morning.    Intractable nausea and vomiting Check strep throat swab and if positive will treat accordingly.  Patient reports her son had similar symptoms today of not feeling well with nausea and vomiting.  Both were COVID-negative in the emergency room but strep throat was not checked according to the patient    DVT prophylaxis: Heparin for DVT prophylaxis.   Code Status:   Full Code  Family Communication:  Diagnosis and plan discussed with patient.  Patient verbalized understanding agrees with plan.  Further recommendation follow as clinical indicated  Disposition Plan:   Patient is from:  Home  Anticipated DC to:  Home  Anticipated DC date:  Anticipate 2 midnight or more stay in the hospital  Admission status:  Inpatient   Yevonne Aline Nakema Fake MD [facility]  How to contact the [facility] Attending or Consulting provider [place] or covering provider during after hours [employer], for this patient?   Check the care team in [facility] and look for a) attending/consulting [facility] provider listed and b) the [facility] team listed Log into [email or web address]  and use [place]'s universal password to access. If you do not have the password, please contact the hospital operator. Locate the [facility] provider you are looking for under [facility] and page to a number that you can be directly reached. If you still have difficulty reaching the provider, please page the [facility] (Director on Call) for the Hospitalists listed on amion for assistance.  11/10/2020, 9:31 PM

## 2020-11-10 NOTE — ED Notes (Signed)
Actively vomiting in triage, standing order for zofran entered.

## 2020-11-10 NOTE — ED Notes (Signed)
[facility] at bedside 

## 2020-11-11 DIAGNOSIS — E1065 Type 1 diabetes mellitus with hyperglycemia: Secondary | ICD-10-CM | POA: Diagnosis not present

## 2020-11-11 LAB — BASIC METABOLIC PANEL
Anion gap: 15 (ref 5–15)
Anion gap: 14 (ref 5–15)
BUN: 51 mg/dL — ABNORMAL HIGH (ref 6–20)
BUN: 52 mg/dL — ABNORMAL HIGH (ref 6–20)
CO2: 19 mmol/L — ABNORMAL LOW (ref 22–32)
CO2: 18 mmol/L — ABNORMAL LOW (ref 22–32)
Calcium: 9.3 mg/dL (ref 8.9–10.3)
Calcium: 9.4 mg/dL (ref 8.9–10.3)
Chloride: 106 mmol/L (ref 98–111)
Chloride: 107 mmol/L (ref 98–111)
Creatinine, Ser: 8.55 mg/dL — ABNORMAL HIGH (ref 0.44–1.00)
Creatinine, Ser: 8.76 mg/dL — ABNORMAL HIGH (ref 0.44–1.00)
GFR, Estimated: 6 mL/min — ABNORMAL LOW (ref >60)
GFR, Estimated: 6 mL/min — ABNORMAL LOW (ref >60)
Glucose, Bld: 92 mg/dL (ref 70–99)
Glucose, Bld: 106 mg/dL — ABNORMAL HIGH (ref 70–99)
Potassium: 4.1 mmol/L (ref 3.5–5.1)
Potassium: 4.1 mmol/L (ref 3.5–5.1)
Sodium: 140 mmol/L (ref 135–145)
Sodium: 139 mmol/L (ref 135–145)

## 2020-11-11 LAB — CBC
HCT: 30.9 % — ABNORMAL LOW (ref 36.0–46.0)
Hemoglobin: 10 g/dL — ABNORMAL LOW (ref 12.0–15.0)
MCH: 27.5 pg (ref 26.0–34.0)
MCHC: 32.4 g/dL (ref 30.0–36.0)
MCV: 84.9 fL (ref 80.0–100.0)
Platelets: 330 10*3/uL (ref 150–400)
RBC: 3.64 MIL/uL — ABNORMAL LOW (ref 3.87–5.11)
RDW: 14.6 % (ref 11.5–15.5)
WBC: 11.3 10*3/uL — ABNORMAL HIGH (ref 4.0–10.5)
nRBC: 0 % (ref 0.0–0.2)

## 2020-11-11 LAB — GLUCOSE, CAPILLARY
Glucose-Capillary: 245 mg/dL — ABNORMAL HIGH (ref 70–99)
Glucose-Capillary: 134 mg/dL — ABNORMAL HIGH (ref 70–99)
Glucose-Capillary: 227 mg/dL — ABNORMAL HIGH (ref 70–99)
Glucose-Capillary: 266 mg/dL — ABNORMAL HIGH (ref 70–99)
Glucose-Capillary: 196 mg/dL — ABNORMAL HIGH (ref 70–99)
Glucose-Capillary: 139 mg/dL — ABNORMAL HIGH (ref 70–99)

## 2020-11-11 LAB — BETA-HYDROXYBUTYRIC ACID: Beta-Hydroxybutyric Acid: 0.71 mmol/L — ABNORMAL HIGH (ref 0.05–0.27)

## 2020-11-11 MED ORDER — LORAZEPAM 0.5 MG PO TABS
0.5000 mg | ORAL_TABLET | Freq: Every day | ORAL | Status: DC | PRN
  Administered 2020-11-11: 1 mg via ORAL
  Filled 2020-11-11: qty 2

## 2020-11-11 MED ORDER — INSULIN ASPART 100 UNIT/ML IJ SOLN: 0.0000 [IU] | Freq: Every day | INTRAMUSCULAR | Status: DC

## 2020-11-11 MED ORDER — METOCLOPRAMIDE HCL 5 MG PO TABS
5.0000 mg | ORAL_TABLET | Freq: Three times a day (TID) | ORAL | Status: DC
  Administered 2020-11-11 – 2020-11-13 (×6): 5 mg via ORAL
  Filled 2020-11-11 (×6): qty 1

## 2020-11-11 MED ORDER — HEPARIN 1000 UNIT/ML FOR PERITONEAL DIALYSIS: 500.0000 [IU] | INTRAMUSCULAR | Status: DC | PRN

## 2020-11-11 MED ORDER — DELFLEX-LC/2.5% DEXTROSE 394 MOSM/L IP SOLN
INTRAPERITONEAL | Status: DC
  Administered 2020-11-11: 10000 mL via INTRAPERITONEAL

## 2020-11-11 MED ORDER — GENTAMICIN SULFATE 0.1 % EX CREA
1.0000 "application " | TOPICAL_CREAM | Freq: Every day | CUTANEOUS | Status: DC
  Administered 2020-11-11 – 2020-11-13 (×4): 1 via TOPICAL
  Filled 2020-11-11: qty 15

## 2020-11-11 MED ORDER — HYDRALAZINE HCL 20 MG/ML IJ SOLN
10.0000 mg | Freq: Four times a day (QID) | INTRAMUSCULAR | Status: DC | PRN
  Administered 2020-11-11 – 2020-11-12 (×3): 10 mg via INTRAVENOUS
  Filled 2020-11-11 (×3): qty 1

## 2020-11-11 MED ORDER — INSULIN ASPART 100 UNIT/ML IJ SOLN
0.0000 [IU] | Freq: Three times a day (TID) | INTRAMUSCULAR | Status: DC
  Administered 2020-11-11: 2 [IU] via SUBCUTANEOUS
  Administered 2020-11-11: 5 [IU] via SUBCUTANEOUS
  Administered 2020-11-12: 9 [IU] via SUBCUTANEOUS

## 2020-11-11 MED ORDER — HEPARIN 1000 UNIT/ML FOR PERITONEAL DIALYSIS
INTRAPERITONEAL | Status: DC | PRN
  Filled 2020-11-11: qty 3000

## 2020-11-11 MED ORDER — CHLORHEXIDINE GLUCONATE CLOTH 2 % EX PADS
6.0000 | MEDICATED_PAD | Freq: Every day | CUTANEOUS | Status: DC
  Administered 2020-11-11 – 2020-11-12 (×2): 6 via TOPICAL

## 2020-11-11 MED ORDER — DELFLEX-LC/1.5% DEXTROSE 344 MOSM/L IP SOLN
INTRAPERITONEAL | Status: DC
  Administered 2020-11-12: 5000 mL via INTRAPERITONEAL

## 2020-11-11 NOTE — Consult Note (Addendum)
[facility] Renal Consultation Note    Indication for Consultation:  Management of ESRD/hemodialysis, anemia, hypertension/volume, and secondary hyperparathyroidism.  HPI: Barbara Donovan is a 30 y.o. female with PMH including ESRD on dialysis, type 1 diabetes on insulin, hypertension, who presented to the ED with nausea and vomiting.  She reported her 23-year-old son had similar symptoms and fever.  Reports abdominal pain and inability to tolerate p.o.  In the ED, she was tachycardic and hypertensive.  Given multiple dose of IV hypertensive and home blood pressure medications were resumed.  Blood sugar was in the 250-350 range with mildly elevated anion gap.  Received subcutaneous insulin and blood sugars improving.  Labs today notable for K4.1, BUN 52, creatinine 8.76, hemoglobin 10.0.  Nephrology has been consulted for management of end-stage renal disease during admission.  Patient recently started peritoneal dialysis.  Prior to that she was on hemodialysis.  She reports she does 5 exchanges overnight with 4 exchanges 1.5% dextrose and 1 exchange 2.5% dextrose.  She denies any recent issues with dialysis, no cloudy fluid, or pain near catheter injection site.  Did not have dialysis overnight due to her ED visit.  At present, she denies shortness of breath, chest pain, palpitations, dizziness, peripheral edema.  Reports abdominal discomfort, nausea, vomiting.  No fever present.  Past Medical History:  Diagnosis Date   Asthma    as a child, no problems as an adult, no inhaler   Cataract    NS OU   Chronic hypertension during pregnancy, antepartum 08/19/2017   Dehydration 01/28/2018   Depression during pregnancy, antepartum 07/07/2017   6/20: Short trial of zoloft previously, reports didn't help much but also didn't give it a chance Discussed r/b/a SSRIs in pregnancy, agrees to try Zoloft again, rx sent No SI/HI/red flags   Diabetes ([place])    TYPE I. A1C 7.5% 05/31/20   Diabetic  retinopathy ([place]) 06/09/2017   07/2017 with bilateral severe diabetic non-proliferative retinopathy with macular edema.   ESRD (end stage renal disease) on dialysis ([place])    4 x week  M T TH F   HTN (hypertension)    Hypertensive retinopathy    OU   Hypokalemia 01/22/2018   Hypomagnesemia 01/28/2018   Intractable nausea and vomiting 01/22/2018   Intrauterine growth restriction (IUGR) affecting care of mother 12/22/2017   Morbid obesity ([place])    Nephropathy, diabetic ([place]) 12/29/2017   Proteinuria due to type 1 diabetes mellitus ([place]) 11/02/2017   Baseline Pr: Cr 10.23   Severe hyperemesis gravidarum 10/30/2017   Type I diabetes mellitus ([place]) 07/07/2017   Current Diabetic Medications:  Insulin  [x]  Aspirin 81 mg daily after 12 weeks (? A2/B GDM)  Required Referrals for A1GDM or A2GDM: [x]  Diabetes Education and Testing Supplies [x]  Nutrition Cousult  For A2/B GDM or higher classes of DM [x]  Diabetes Education and Testing Supplies [x]  Nutrition Counsult [x]  Fetal ECHO after 20 weeks  [x]  Eye exam for retina evaluation - severe retinopathy 7/19  Base   Ventricular septal defect (VSD) of fetus in singleton pregnancy, antepartum 09/30/2017   May go to newborn nursery per Dr. Lenard Simmer Echo prior to discharge   Past Surgical History:  Procedure Laterality Date   25 GAUGE PARS PLANA VITRECTOMY WITH 20 GAUGE MVR PORT FOR MACULAR HOLE Left 07/20/2018   Procedure: 25 GAUGE PARS PLANA VITRECTOMY LEFT EYE;  Surgeon: Bernarda Caffey, MD;  Location: [place];  Service: Ophthalmology;  Laterality: Left;   CAPD INSERTION N/A 09/10/2020  Procedure: LAPAROSCOPIC INSERTION CONTINUOUS AMBULATORY PERITONEAL DIALYSIS  (CAPD) CATHETER;  Surgeon: Serafina Mitchell, MD;  Location: [place];  Service: Vascular;  Laterality: N/A;   CESAREAN SECTION N/A 12/26/2017   Procedure: CESAREAN SECTION;  Surgeon: Osborne Oman, MD;  Location: [place];  Service: Obstetrics;  Laterality: N/A;   EYE EXAMINATION UNDER ANESTHESIA  Right 07/20/2018   Procedure: Eye Exam Under Anesthesia RIGHT EYE;  Surgeon: Bernarda Caffey, MD;  Location: [place];  Service: Ophthalmology;  Laterality: Right;   EYE SURGERY Left 07/2018   GAS INSERTION Left 07/19/2019   Procedure: INSERTION OF GAS;  Surgeon: Bernarda Caffey, MD;  Location: [place];  Service: Ophthalmology;  Laterality: Left;  SF6   INJECTION OF SILICONE OIL Left 02/24/621   Procedure: Injection Of Silicone Oil LEFT EYE;  Surgeon: Bernarda Caffey, MD;  Location: [place];  Service: Ophthalmology;  Laterality: Left;   IR FLUORO GUIDE CV LINE RIGHT  06/02/2020   IR US GUIDE VASC ACCESS RIGHT  06/02/2020   LASER PHOTO ABLATION Right 07/20/2018   Procedure: Laser Photo Ablation RIGHT EYE;  Surgeon: Bernarda Caffey, MD;  Location: [place];  Service: Ophthalmology;  Laterality: Right;   MEMBRANE PEEL Left 07/20/2018   Procedure: Membrane Peel LEFT EYE;  Surgeon: Bernarda Caffey, MD;  Location: [place];  Service: Ophthalmology;  Laterality: Left;   MEMBRANE PEEL Left 07/19/2019   Procedure: MEMBRANE PEEL;  Surgeon: Bernarda Caffey, MD;  Location: [place];  Service: Ophthalmology;  Laterality: Left;   MITOMYCIN C APPLICATION Bilateral 07/23/2829   Procedure: Avastin Application;  Surgeon: Bernarda Caffey, MD;  Location: [facility];  Service: Ophthalmology;  Laterality: Bilateral;   PHOTOCOAGULATION WITH LASER Left 07/20/2018   Procedure: Photocoagulation With Laser LEFT EYE;  Surgeon: Bernarda Caffey, MD;  Location: [facility];  Service: Ophthalmology;  Laterality: Left;   PHOTOCOAGULATION WITH LASER Left 07/19/2019   Procedure: PHOTOCOAGULATION WITH LASER;  Surgeon: Bernarda Caffey, MD;  Location: [facility];  Service: Ophthalmology;  Laterality: Left;   RETINAL DETACHMENT SURGERY Left 07/20/2018   Dr. Bernarda Caffey   SILICON OIL REMOVAL Left 07/19/2019   Procedure: 25g PARS PLANA VITRECTOMY WITH SILICON OIL REMOVAL;  Surgeon: Bernarda Caffey, MD;  Location: [facility];  Service: Ophthalmology;  Laterality: Left;   WISDOM TOOTH EXTRACTION     Family  History  Problem Relation Age of Onset   Diabetes Mother    Aneurysm Mother 41   Seizures Mother    Diabetes Father    Cataracts Father    COPD Father    Heart attack Father    Heart disease Father    Healthy Sister    Healthy Daughter    Stroke Maternal Grandfather    Amblyopia Neg Hx    Blindness Neg Hx    Glaucoma Neg Hx    Macular degeneration Neg Hx    Retinal detachment Neg Hx    Strabismus Neg Hx    Retinitis pigmentosa Neg Hx    Colon cancer Neg Hx    Stomach cancer Neg Hx    Esophageal cancer Neg Hx    Pancreatic cancer Neg Hx    Liver disease Neg Hx    Social History:  reports that she has never smoked. She has never used smokeless tobacco. She reports that she does not currently use alcohol. She reports that she does not use drugs.  ROS: As per HPI otherwise negative.   Physical Exam: Vitals:   11/11/20 0400 11/11/20 0500 11/11/20 0803 11/11/20 0830  BP: (!) 191/84 (!) 189/97 (!) 219/119 (!) 188/145  Pulse: (!) 119 (!) 115 (!) 114 (!) 110  Resp:  20 16   Temp:   98.7 F (37.1 C)   TempSrc:   Oral   SpO2: 96% 95% 98% 96%  Weight:      Height:         General: Well developed, ill appearing female in NAD Head: Normocephalic, atraumatic, sclera non-icteric, mucus membranes are moist. Lungs: Clear bilaterally to auscultation without wheezes, rales, or rhonchi. Breathing is unlabored on RA Heart: RRR with normal S1, S2. No murmurs, rubs, or gallops appreciated. Abdomen: Soft, diffuse mild TTP, non-distended with normoactive bowel sounds.No obvious abdominal masses. Musculoskeletal:  Strength and tone appear normal for age. Lower extremities: No edema or ischemic changes, no open wounds. Neuro: Alert and oriented X 3. Moves all extremities spontaneously. Psych:  Responds to questions appropriately  Dialysis Access: PD cath exit site clean with no bleeding, erythema or drainage  No Known Allergies Prior to Admission medications   Medication Sig Start Date  End Date Taking? Authorizing Provider  amLODipine (NORVASC) 10 MG tablet Take 1 tablet (10 mg total) by mouth daily. 03/09/20   Mercy Riding, MD  carvedilol (COREG) 25 MG tablet Take 1 tablet (25 mg total) by mouth 2 (two) times daily. 03/07/19   Imogene Burn, PA-C  Continuous Blood Gluc Sensor MISC 1 each by Does not apply route as directed. Use as directed every 14 days. May dispense FreeStyle [employer] or similar. 01/27/20   Antonieta Pert, MD  hydrALAZINE (APRESOLINE) 50 MG tablet Take 2 tablets (100 mg total) by mouth 3 (three) times daily. 06/09/20   Geradine Girt, DO  HYDROcodone-acetaminophen (NORCO/VICODIN) 5-325 MG tablet Take 1 tablet by mouth every 4 (four) hours as needed for moderate pain. 09/10/20 09/10/21  Setzer, Edman Circle, PA-C  insulin glargine (LANTUS SOLOSTAR) 100 UNIT/ML Solostar Pen Inject 16 Units into the skin every morning. 11/06/20   Renato Shin, MD  insulin lispro (HUMALOG KWIKPEN) 100 UNIT/ML KwikPen Inject 2 Units into the skin 3 (three) times daily with meals. 04/25/20   Renato Shin, MD  LORazepam (ATIVAN) 0.5 MG tablet Take 0.5-1 mg by mouth daily as needed for nausea. 08/21/20   [provider]  losartan (COZAAR) 50 MG tablet Take 2 tablets (100 mg total) by mouth daily. 06/09/20   Geradine Girt, DO  metoCLOPramide (REGLAN) 5 MG tablet Take 1 tablet (5 mg total) by mouth 4 (four) times daily -  before meals and at bedtime. 08/28/20   Levin Erp, PA  multivitamin (RENA-VIT) TABS tablet Take 1 tablet by mouth at bedtime. 03/08/20   Mercy Riding, MD  ondansetron (ZOFRAN ODT) 4 MG disintegrating tablet Take 1 tablet (4 mg total) by mouth every 8 (eight) hours as needed for nausea or vomiting. 08/02/20   Deno Etienne, DO  pantoprazole (PROTONIX) 40 MG tablet Take 1 tablet (40 mg total) by mouth daily. 01/31/20   Ladene Artist, MD  promethazine (PHENERGAN) 25 MG suppository Place 1 suppository (25 mg total) rectally every 6 (six) hours as needed for  nausea or vomiting. 08/02/20   Deno Etienne, DO   Current Facility-Administered Medications  Medication Dose Route Frequency Provider Last Rate Last Admin   acetaminophen (TYLENOL) tablet 650 mg  650 mg Oral Q6H PRN Chotiner, Yevonne Aline, MD       Or   acetaminophen (TYLENOL) suppository 650 mg  650 mg Rectal  Q6H PRN Chotiner, Yevonne Aline, MD       amLODipine (NORVASC) tablet 10 mg  10 mg Oral Daily Chotiner, Yevonne Aline, MD   10 mg at 11/11/20 6720   carvedilol (COREG) tablet 25 mg  25 mg Oral BID WC Chotiner, Yevonne Aline, MD   25 mg at 11/11/20 9470   Chlorhexidine Gluconate Cloth 2 % PADS 6 each  6 each Topical Daily Chotiner, Yevonne Aline, MD   6 each at 11/11/20 0834   heparin injection 5,000 Units  5,000 Units Subcutaneous Q8H Chotiner, Yevonne Aline, MD   5,000 Units at 11/11/20 9628   hydrALAZINE (APRESOLINE) injection 10 mg  10 mg Intravenous Q6H PRN Thurnell Lose, MD   10 mg at 11/11/20 3662   hydrALAZINE (APRESOLINE) tablet 100 mg  100 mg Oral TID Chotiner, Yevonne Aline, MD   100 mg at 11/11/20 9476   insulin aspart (novoLOG) injection 0-5 Units  0-5 Units Subcutaneous QHS Thurnell Lose, MD       insulin aspart (novoLOG) injection 0-9 Units  0-9 Units Subcutaneous TID WC Thurnell Lose, MD       insulin glargine-yfgn (SEMGLEE) injection 16 Units  16 Units Subcutaneous Daily Chotiner, Yevonne Aline, MD   16 Units at 11/11/20 5465   losartan (COZAAR) tablet 100 mg  100 mg Oral Daily Chotiner, Yevonne Aline, MD   100 mg at 11/11/20 0354   multivitamin (RENA-VIT) tablet 1 tablet  1 tablet Oral QHS Chotiner, Yevonne Aline, MD   1 tablet at 11/10/20 2256   ondansetron (ZOFRAN) tablet 4 mg  4 mg Oral Q6H PRN Chotiner, Yevonne Aline, MD       Or   ondansetron [facility]) injection 4 mg  4 mg Intravenous Q6H PRN Chotiner, Yevonne Aline, MD       pantoprazole (PROTONIX) EC tablet 40 mg  40 mg Oral Daily Chotiner, Yevonne Aline, MD   40 mg at 11/11/20 6568   promethazine (PHENERGAN) 12.5 mg in sodium chloride 0.9 % 50 mL IVPB   12.5 mg Intravenous Q6H PRN Wyvonnia Dusky, MD   Stopped at 11/10/20 1855   Labs: Basic Metabolic Panel: Recent Labs  Lab 11/10/20 2049 11/11/20 0147 11/11/20 0515  NA 137 140 139  K 4.2 4.1 4.1  CL 104 106 107  CO2 18* 19* 18*  GLUCOSE 295* 92 106*  BUN 52* 51* 52*  CREATININE 8.52* 8.55* 8.76*  CALCIUM 9.4 9.3 9.4   Liver Function Tests: Recent Labs  Lab 11/10/20 1501  AST 15  ALT 13  ALKPHOS 70  BILITOT 0.4  PROT 7.9  ALBUMIN 4.0   Recent Labs  Lab 11/10/20 1501  LIPASE 130*   No results for input(s): AMMONIA in the last 168 hours. CBC: Recent Labs  Lab 11/10/20 1501 11/11/20 0515  WBC 11.6* 11.3*  HGB 10.9* 10.0*  HCT 33.3* 30.9*  MCV 81.4 84.9  PLT 400 330   Cardiac Enzymes: No results for input(s): CKTOTAL, CKMB, CKMBINDEX, TROPONINI in the last 168 hours. CBG: Recent Labs  Lab 11/10/20 1757 11/10/20 1931 11/10/20 2123 11/11/20 0557 11/11/20 0807  GLUCAP 281* 326* 245* 134* 227*   Iron Studies: No results for input(s): IRON, TIBC, TRANSFERRIN, FERRITIN in the last 72 hours. Studies/Results: No results found.  Dialysis Orders:  Outpatient nephrologist: Dr. Joylene Grapes PD 7x week 5 exchanges, no day dwell, EDW 63kg, Dwell time 1.5hr, fill volume 2L Using 4 exchanges 1.5% dextrose and 1 exchange 2.5% dextrose  Assessment/Plan:  Nausea  and vomiting: Likely due to elevated blood sugars, see below, primary team checking throat swab as well. T1DM: Blood sugars high but improving, insulin per primary team  ESRD:  On PD. Will resume tonight. Using 4 exchanges 1.5% and 1 exchange 2.5%.   Hypertension/volume: Given IV hydralazine and IV metoprolol on arrival. Home BP meds resumed today. If BP remains elevated with antihypertensives, will add an additional 2.5% exchange to attempt to offload more volume.   Anemia: Hgb at goal, no ESA indicated at present, monitor trend.  Metabolic bone disease: Calcium controlled, no phos reported yet. Resume binders  once tolerating PO.   Nutrition:  Albumin 4.0. Not tolerating PO yet.   Anice Paganini, PA-C 11/11/2020, 9:22 AM  [facility] Pager: [phone]  Nephrology attending: Patient was seen and examined at bedside.  Chart reviewed.  I agree with the consult note as outlined above. 30 year old female with history of diabetes, ESRD on PD admitted with intractable nausea vomiting.  Patient received IV fluid on admission. The volume status looks acceptable.  She is not in distress.  Lungs clear.  No lower extreme edema.  Abdomen exam benign.  We will resume PD from tonight.  Noted hypertensive urgency therefore recommend to resume home antihypertensive medications and IV medicine as needed. Management of nausea vomiting per primary team.  Katheran James, MD [facility].

## 2020-11-11 NOTE — Progress Notes (Signed)
Inpatient Diabetes Program Recommendations  AACE/ADA: New Consensus Statement on Inpatient Glycemic Control (2015)  Target Ranges:  Prepandial:   less than 140 mg/dL      Peak postprandial:   less than 180 mg/dL (1-2 hours)      Critically ill patients:  140 - 180 mg/dL   Lab Results  Component Value Date   GLUCAP 266 (H) 11/11/2020   HGBA1C 8.8 (H) 11/10/2020    Review of Glycemic Control Results for Barbara Donovan, Barbara Donovan (MRN [id number]) as of 11/11/2020 13:35  Ref. Range 11/10/2020 19:31 11/10/2020 21:23 11/11/2020 05:57 11/11/2020 08:07 11/11/2020 11:52  Glucose-Capillary Latest Ref Range: 70 - 99 mg/dL 326 (H) 245 (H) 134 (H) 227 (H) 266 (H)   Diabetes history: DM 1 Outpatient Diabetes medications:  Lantus 16 units q AM, Humalog 2 units tid with meals Current orders for Inpatient glycemic control:  Lantus 16 units daily, Novolog sensitive tid with meals and HS  Inpatient Diabetes Program Recommendations:    May consider reducing Novolog correction to "very sensitive" tid with meals and HS scale.  Also consider adding Novolog 2 units tid with meals for meal coverage.    Thanks,  Adah Perl, RN, BC-ADM Inpatient Diabetes Coordinator Pager [phone]  (8a-5p)

## 2020-11-11 NOTE — Progress Notes (Signed)
PROGRESS NOTE                                                                                                                                                                                                             Patient Demographics:    Barbara Donovan, is a 30 y.o. female, DOB - 09-Sep-1990, LKG:[id number]  Outpatient Primary MD for the patient is Fanny Bien, MD    LOS - 1  Admit date - 11/10/2020    Chief Complaint  Patient presents with   Emesis       Brief Narrative (HPI from H&P)    Barbara Donovan is a 30 y.o. female with medical history significant for end stage kidney disease on home peritoneal dialysis,  diabetic retinopathy, type 1 diabetes on insulin, presented emerge apartment nausea, vomiting that this morning when she woke up, she missed taking her insulin and her peritoneal dialysis came to the ER with nausea vomiting and not feeling better was diagnosed with hypertensive urgency, nonketotic hyperosmolar state and admitted for further treatment.   Subjective:    Omelia Marquart today has, No headache, No chest pain, No abdominal pain - No Nausea, No new weakness tingling or numbness, no SOB   Assessment  & Plan :     Nonketotic hyperosmolar state in a patient with DM type I who missed her insulin dose - she missed her insulin as she is having nausea vomiting due to diabetic gastroparesis, she has been started on appropriate insulin regimen with good control, doing better now.  Counseled on compliance.  2. ESRD .  On peritoneal dialysis.  Have consulted nephrology.  3.  Diabetic gastroparesis and diabetic retinopathy.  Currently no nausea vomiting.  Supportive care and monitor.  4.  Hypertensive urgency.  Blood pressure medications adjusted will monitor closely.         Condition - Fair  Family Communication  :  None present  Code Status :  Full  Consults  :  Renal  PUD Prophylaxis  :    Procedures  :            Disposition Plan  :    Status is: Inpatient  Remains inpatient appropriate because: DKA, PD, Dehydrated  DVT Prophylaxis  :    heparin injection 5,000 Units Start: 11/10/20 2245    Lab  Results  Component Value Date   PLT 330 11/11/2020    Diet :  Diet Order             Diet renal with fluid restriction Fluid restriction: 1200 mL Fluid; Room service appropriate? Yes; Fluid consistency: Thin  Diet effective now                    Inpatient Medications  Scheduled Meds:  amLODipine  10 mg Oral Daily   carvedilol  25 mg Oral BID WC   Chlorhexidine Gluconate Cloth  6 each Topical Daily   gentamicin cream  1 application Topical Daily   heparin  5,000 Units Subcutaneous Q8H   hydrALAZINE  100 mg Oral TID   insulin aspart  0-5 Units Subcutaneous QHS   insulin aspart  0-9 Units Subcutaneous TID WC   insulin glargine-yfgn  16 Units Subcutaneous Daily   losartan  100 mg Oral Daily   multivitamin  1 tablet Oral QHS   pantoprazole  40 mg Oral Daily   Continuous Infusions:  dialysis solution 1.5% low-MG/low-CA     dialysis solution 2.5% low-MG/low-CA     promethazine (PHENERGAN) injection (IM or IVPB) Stopped (11/10/20 1855)   PRN Meds:.acetaminophen **OR** acetaminophen, dianeal solution for CAPD/CCPD with heparin, dianeal solution for CAPD/CCPD with heparin, hydrALAZINE, ondansetron **OR** ondansetron (ZOFRAN) IV, promethazine (PHENERGAN) injection (IM or IVPB)  Antibiotics  :    Anti-infectives (From admission, onward)    None        Time Spent in minutes  30   Lala Lund M.D on 11/11/2020 at 1:03 PM  To page go to [email or web address]   [facility] -  Office  [phone]  See all Orders from today for further details    Objective:   Vitals:   11/11/20 0500 11/11/20 0803 11/11/20 0830 11/11/20 1148  BP: (!) 189/97 (!) 219/119 (!) 188/145 (!) 163/91  Pulse: (!) 115 (!) 114 (!) 110 (!) 108  Resp: 20 16  15    Temp:  98.7 F (37.1 C)  98.6 F (37 C)  TempSrc:  Oral  Oral  SpO2: 95% 98% 96% 96%  Weight:      Height:        Wt Readings from Last 3 Encounters:  11/10/20 64.6 kg  10/09/20 65.8 kg  09/10/20 63.5 kg     Intake/Output Summary (Last 24 hours) at 11/11/2020 1303 Last data filed at 11/11/2020 0306 Gross per 24 hour  Intake 20.25 ml  Output --  Net 20.25 ml     Physical Exam  Awake Alert, No new F.N deficits, Normal affect [place].AT,PERRAL Supple Neck, No JVD,   Symmetrical Chest wall movement, Good air movement bilaterally, CTAB RRR,No Gallops,Rubs or new Murmurs,  +ve B.Sounds, Abd Soft, No tenderness,   No Cyanosis, Clubbing or edema,        Data Review:    CBC Recent Labs  Lab 11/10/20 1501 11/11/20 0515  WBC 11.6* 11.3*  HGB 10.9* 10.0*  HCT 33.3* 30.9*  PLT 400 330  MCV 81.4 84.9  MCH 26.7 27.5  MCHC 32.7 32.4  RDW 14.3 14.6    Recent Labs  Lab 11/10/20 1501 11/10/20 1752 11/10/20 2049 11/11/20 0147 11/11/20 0515  NA 137 138 137 140 139  K 4.3 4.4 4.2 4.1 4.1  CL 99 102 104 106 107  CO2 20* 20* 18* 19* 18*  GLUCOSE 253* 279* 295* 92 106*  BUN 52* 52* 52* 51* 52*  CREATININE 8.54* 8.18*  8.52* 8.55* 8.76*  CALCIUM 10.2 9.2 9.4 9.3 9.4  AST 15  --   --   --   --   ALT 13  --   --   --   --   ALKPHOS 70  --   --   --   --   BILITOT 0.4  --   --   --   --   ALBUMIN 4.0  --   --   --   --   HGBA1C  --   --  8.8*  --   --     ------------------------------------------------------------------------------------------------------------------ No results for input(s): CHOL, HDL, LDLCALC, TRIG, CHOLHDL, LDLDIRECT in the last 72 hours.  Lab Results  Component Value Date   HGBA1C 8.8 (H) 11/10/2020   ------------------------------------------------------------------------------------------------------------------ No results for input(s): TSH, T4TOTAL, T3FREE, THYROIDAB in the last 72 hours.  Invalid input(s): FREET3  Cardiac Enzymes No  results for input(s): CKMB, TROPONINI, MYOGLOBIN in the last 168 hours.  Invalid input(s): CK ------------------------------------------------------------------------------------------------------------------ No results found for: BNP   Radiology Reports No results found.

## 2020-11-12 DIAGNOSIS — E1065 Type 1 diabetes mellitus with hyperglycemia: Secondary | ICD-10-CM | POA: Diagnosis not present

## 2020-11-12 LAB — BASIC METABOLIC PANEL
Anion gap: 16 — ABNORMAL HIGH (ref 5–15)
BUN: 51 mg/dL — ABNORMAL HIGH (ref 6–20)
CO2: 18 mmol/L — ABNORMAL LOW (ref 22–32)
Calcium: 9.1 mg/dL (ref 8.9–10.3)
Chloride: 97 mmol/L — ABNORMAL LOW (ref 98–111)
Creatinine, Ser: 8.56 mg/dL — ABNORMAL HIGH (ref 0.44–1.00)
GFR, Estimated: 6 mL/min — ABNORMAL LOW (ref >60)
Glucose, Bld: 437 mg/dL — ABNORMAL HIGH (ref 70–99)
Potassium: 3.9 mmol/L (ref 3.5–5.1)
Sodium: 131 mmol/L — ABNORMAL LOW (ref 135–145)

## 2020-11-12 LAB — GLUCOSE, CAPILLARY
Glucose-Capillary: 352 mg/dL — ABNORMAL HIGH (ref 70–99)
Glucose-Capillary: 211 mg/dL — ABNORMAL HIGH (ref 70–99)
Glucose-Capillary: 112 mg/dL — ABNORMAL HIGH (ref 70–99)
Glucose-Capillary: 150 mg/dL — ABNORMAL HIGH (ref 70–99)

## 2020-11-12 MED ORDER — CLONIDINE HCL 0.1 MG PO TABS
0.1000 mg | ORAL_TABLET | Freq: Every day | ORAL | Status: DC
  Administered 2020-11-12 – 2020-11-13 (×2): 0.1 mg via ORAL
  Filled 2020-11-12 (×2): qty 1

## 2020-11-12 MED ORDER — GENTAMICIN SULFATE 0.1 % EX CREA: 1.0000 "application " | TOPICAL_CREAM | Freq: Every day | CUTANEOUS | Status: DC

## 2020-11-12 MED ORDER — INSULIN ASPART 100 UNIT/ML IJ SOLN
3.0000 [IU] | Freq: Three times a day (TID) | INTRAMUSCULAR | Status: DC
  Administered 2020-11-12 – 2020-11-13 (×2): 3 [IU] via SUBCUTANEOUS

## 2020-11-12 MED ORDER — HEPARIN 1000 UNIT/ML FOR PERITONEAL DIALYSIS: 500.0000 [IU] | INTRAMUSCULAR | Status: DC | PRN

## 2020-11-12 MED ORDER — SODIUM CHLORIDE 0.9 % IV SOLN
25.0000 mg | Freq: Once | INTRAVENOUS | Status: AC
  Administered 2020-11-12: 25 mg via INTRAVENOUS
  Filled 2020-11-12: qty 1

## 2020-11-12 MED ORDER — SODIUM CHLORIDE 0.9 % IV SOLN
8.0000 mg | Freq: Once | INTRAVENOUS | Status: AC
  Administered 2020-11-12: 8 mg via INTRAVENOUS
  Filled 2020-11-12: qty 4

## 2020-11-12 MED ORDER — INSULIN ASPART 100 UNIT/ML IJ SOLN: 0.0000 [IU] | Freq: Every day | INTRAMUSCULAR | Status: DC

## 2020-11-12 MED ORDER — INSULIN ASPART 100 UNIT/ML IJ SOLN
0.0000 [IU] | Freq: Three times a day (TID) | INTRAMUSCULAR | Status: DC
  Administered 2020-11-12: 5 [IU] via SUBCUTANEOUS
  Administered 2020-11-13: 2 [IU] via SUBCUTANEOUS

## 2020-11-12 MED ORDER — INSULIN GLARGINE-YFGN 100 UNIT/ML ~~LOC~~ SOLN
25.0000 [IU] | Freq: Every day | SUBCUTANEOUS | Status: DC
  Administered 2020-11-12 – 2020-11-13 (×2): 25 [IU] via SUBCUTANEOUS
  Filled 2020-11-12 (×2): qty 0.25

## 2020-11-12 NOTE — Plan of Care (Signed)
  Problem: Education: Goal: Knowledge of General Education information will improve Description: Including pain rating scale, medication(s)/side effects and non-pharmacologic comfort measures 11/12/2020 1559 by Thressa Sheller, RN Outcome: Progressing 11/12/2020 1558 by Thressa Sheller, RN Outcome: Progressing   Problem: Health Behavior/Discharge Planning: Goal: Ability to manage health-related needs will improve 11/12/2020 1559 by Thressa Sheller, RN Outcome: Progressing 11/12/2020 1558 by Thressa Sheller, RN Outcome: Progressing   Problem: Clinical Measurements: Goal: Ability to maintain clinical measurements within normal limits will improve Outcome: Progressing Goal: Will remain free from infection Outcome: Progressing Goal: Diagnostic test results will improve Outcome: Progressing Goal: Respiratory complications will improve Outcome: Progressing Goal: Cardiovascular complication will be avoided Outcome: Progressing   Problem: Activity: Goal: Risk for activity intolerance will decrease Outcome: Progressing   Problem: Nutrition: Goal: Adequate nutrition will be maintained 11/12/2020 1559 by Thressa Sheller, RN Outcome: Progressing 11/12/2020 1558 by Thressa Sheller, RN Outcome: Progressing   Problem: Coping: Goal: Level of anxiety will decrease Outcome: Progressing   Problem: Elimination: Goal: Will not experience complications related to bowel motility Outcome: Progressing Goal: Will not experience complications related to urinary retention Outcome: Progressing   Problem: Pain Managment: Goal: General experience of comfort will improve Outcome: Progressing   Problem: Safety: Goal: Ability to remain free from injury will improve Outcome: Progressing   Problem: Skin Integrity: Goal: Risk for impaired skin integrity will decrease 11/12/2020 1559 by Thressa Sheller, RN Outcome: Progressing 11/12/2020 1558 by Thressa Sheller,  RN Outcome: Progressing

## 2020-11-12 NOTE — Progress Notes (Signed)
PROGRESS NOTE                                                                                                                                                                                                             Patient Demographics:    Barbara Donovan, is a 30 y.o. female, DOB - 1990-04-27, JIR:[id number]  Outpatient Primary MD for the patient is Fanny Bien, MD    LOS - 2  Admit date - 11/10/2020    Chief Complaint  Patient presents with   Emesis       Brief Narrative (HPI from H&P)    Barbara Donovan is a 30 y.o. female with medical history significant for end stage kidney disease on home peritoneal dialysis,  diabetic retinopathy, type 1 diabetes on insulin, presented emerge apartment nausea, vomiting that this morning when she woke up, she missed taking her insulin and her peritoneal dialysis came to the ER with nausea vomiting and not feeling better was diagnosed with hypertensive urgency, nonketotic hyperosmolar state and admitted for further treatment.   Subjective:   Patient in bed, appears comfortable, denies any headache, no fever, no chest pain or pressure, no shortness of breath , no abdominal pain but + nausea and 1 emesis this am. No new focal weakness.    Assessment  & Plan :     Nonketotic hyperosmolar state in a patient with DM type I who missed her insulin dose - she missed her insulin as she is having nausea vomiting due to diabetic gastroparesis, she has been started on appropriate insulin regimen dose adjusted for better control, labile sugars - monitor closely.  Counseled on compliance.  2. ESRD .  On peritoneal dialysis.  Have consulted nephrology.  3.  Diabetic gastroparesis and diabetic retinopathy.  Currently no nausea vomiting.  Supportive care and monitor.  4.  Hypertensive urgency.  Blood pressure medications adjusted will monitor closely.    Lab Results  Component Value  Date   HGBA1C 8.8 (H) 11/10/2020    CBG (last 3)  Recent Labs    11/11/20 1549 11/11/20 1924 11/12/20 0641  GLUCAP 196* 139* 352*        Condition - Fair  Family Communication  :  None present  Code Status :  Full  Consults  :  Renal  PUD Prophylaxis :  Procedures  :            Disposition Plan  :    Status is: Inpatient  Remains inpatient appropriate because: DKA, PD, Dehydrated  DVT Prophylaxis  :    heparin injection 5,000 Units Start: 11/10/20 2245    Lab Results  Component Value Date   PLT 330 11/11/2020    Diet :  Diet Order             Diet renal with fluid restriction Fluid restriction: 1800 mL Fluid; Room service appropriate? Yes; Fluid consistency: Thin  Diet effective now                    Inpatient Medications  Scheduled Meds:  amLODipine  10 mg Oral Daily   carvedilol  25 mg Oral BID WC   Chlorhexidine Gluconate Cloth  6 each Topical Daily   cloNIDine  0.1 mg Oral Daily   gentamicin cream  1 application Topical Daily   heparin  5,000 Units Subcutaneous Q8H   hydrALAZINE  100 mg Oral TID   insulin aspart  0-15 Units Subcutaneous TID WC   insulin aspart  0-5 Units Subcutaneous QHS   insulin aspart  3 Units Subcutaneous TID WC   insulin glargine-yfgn  25 Units Subcutaneous Daily   losartan  100 mg Oral Daily   metoCLOPramide  5 mg Oral TID AC & HS   multivitamin  1 tablet Oral QHS   pantoprazole  40 mg Oral Daily   Continuous Infusions:  dialysis solution 1.5% low-MG/low-CA     dialysis solution 2.5% low-MG/low-CA     ondansetron (ZOFRAN) IV     promethazine (PHENERGAN) injection (IM or IVPB)     PRN Meds:.acetaminophen **OR** acetaminophen, dianeal solution for CAPD/CCPD with heparin, dianeal solution for CAPD/CCPD with heparin, hydrALAZINE, LORazepam, ondansetron **OR** ondansetron (ZOFRAN) IV  Antibiotics  :    Anti-infectives (From admission, onward)    None        Time Spent in minutes  30   Lala Lund M.D on 11/12/2020 at 9:30 AM  To page go to [email or web address]   [facility] -  Office  [phone]  See all Orders from today for further details    Objective:   Vitals:   11/12/20 0400 11/12/20 0500 11/12/20 0600 11/12/20 0801  BP: (!) 163/84 (!) 161/101 (!) 172/95 128/76  Pulse: (!) 112 (!) 114 (!) 113 (!) 107  Resp:    20  Temp:    98.6 F (37 C)  TempSrc:    Oral  SpO2: 94% 94% 95% 93%  Weight:      Height:        Wt Readings from Last 3 Encounters:  11/11/20 65.5 kg  10/09/20 65.8 kg  09/10/20 63.5 kg     Intake/Output Summary (Last 24 hours) at 11/12/2020 0930 Last data filed at 11/12/2020 0300 Gross per 24 hour  Intake 340 ml  Output 800 ml  Net -460 ml     Physical Exam  Awake Alert, No new F.N deficits, Normal affect [place].AT,PERRAL Supple Neck, No JVD,   Symmetrical Chest wall movement, Good air movement bilaterally, CTAB RRR,No Gallops, Rubs or new Murmurs,  +ve B.Sounds, Abd Soft, No tenderness,   No Cyanosis, Clubbing or edema,        Data Review:    CBC Recent Labs  Lab 11/10/20 1501 11/11/20 0515  WBC 11.6* 11.3*  HGB 10.9* 10.0*  HCT 33.3* 30.9*  PLT 400 330  MCV 81.4 84.9  MCH 26.7 27.5  MCHC 32.7 32.4  RDW 14.3 14.6    Recent Labs  Lab 11/10/20 1501 11/10/20 1752 11/10/20 2049 11/11/20 0147 11/11/20 0515 11/12/20 0346  NA 137 138 137 140 139 131*  K 4.3 4.4 4.2 4.1 4.1 3.9  CL 99 102 104 106 107 97*  CO2 20* 20* 18* 19* 18* 18*  GLUCOSE 253* 279* 295* 92 106* 437*  BUN 52* 52* 52* 51* 52* 51*  CREATININE 8.54* 8.18* 8.52* 8.55* 8.76* 8.56*  CALCIUM 10.2 9.2 9.4 9.3 9.4 9.1  AST 15  --   --   --   --   --   ALT 13  --   --   --   --   --   ALKPHOS 70  --   --   --   --   --   BILITOT 0.4  --   --   --   --   --   ALBUMIN 4.0  --   --   --   --   --   HGBA1C  --   --  8.8*  --   --   --      ------------------------------------------------------------------------------------------------------------------ No results for input(s): CHOL, HDL, LDLCALC, TRIG, CHOLHDL, LDLDIRECT in the last 72 hours.  Lab Results  Component Value Date   HGBA1C 8.8 (H) 11/10/2020   ------------------------------------------------------------------------------------------------------------------ No results for input(s): TSH, T4TOTAL, T3FREE, THYROIDAB in the last 72 hours.  Invalid input(s): FREET3  Cardiac Enzymes No results for input(s): CKMB, TROPONINI, MYOGLOBIN in the last 168 hours.  Invalid input(s): CK ------------------------------------------------------------------------------------------------------------------ No results found for: BNP   Radiology Reports No results found.

## 2020-11-12 NOTE — Progress Notes (Signed)
Patient alert and oriented. Patient has had high BP all day. PRNs given. Patient shows no s/s of distress. Patient stable.   11/12/20 0100  Assess: MEWS Score  BP (!) 203/106  Pulse Rate (!) 112  ECG Heart Rate (!) 114  SpO2 97 %  Assess: MEWS Score  MEWS Temp 0  MEWS Systolic 2  MEWS Pulse 2  MEWS RR 0  MEWS LOC 0  MEWS Score 4  MEWS Score Color Red  Assess: if the MEWS score is Yellow or Red  Were vital signs taken at a resting state? Yes  Focused Assessment No change from prior assessment  Early Detection of Sepsis Score *See Row Information* Low  MEWS guidelines implemented *See Row Information* No, previously red, continue vital signs every 4 hours  Treat  MEWS Interventions Administered prn meds/treatments;Administered scheduled meds/treatments  Pain Scale 0-10  Pain Score 0  Take Vital Signs  Increase Vital Sign Frequency   (cont assessing VS every 4hours)  Document  Patient Outcome  (stable at this moment;PRNs given)  Progress note created (see row info) Yes

## 2020-11-12 NOTE — Progress Notes (Addendum)
Inpatient Diabetes Program Recommendations  AACE/ADA: New Consensus Statement on Inpatient Glycemic Control (2015)  Target Ranges:  Prepandial:   less than 140 mg/dL      Peak postprandial:   less than 180 mg/dL (1-2 hours)      Critically ill patients:  140 - 180 mg/dL   Lab Results  Component Value Date   GLUCAP 352 (H) 11/12/2020   HGBA1C 8.8 (H) 11/10/2020    Review of Glycemic Control Results for Barbara Donovan, Barbara Donovan (MRN [id number]) as of 11/12/2020 10:59  Ref. Range 11/11/2020 08:07 11/11/2020 11:52 11/11/2020 15:49 11/11/2020 19:24 11/12/2020 06:41  Glucose-Capillary Latest Ref Range: 70 - 99 mg/dL 227 (H) 266 (H) 196 (H) 139 (H) 352 (H)  Diabetes history: DM 1 Outpatient Diabetes medications:  Lantus 16 units q AM, Humalog 2 units tid with meals Current orders for Inpatient glycemic control:  Lantus 25 units daily, Novolog moderate tid with meals and HS, Novolog 3 units tid with meals  Inpatient Diabetes Program Recommendations:   Noted that patients blood sugar increased this morning. May consider splitting dose of Semglee to 10 units bid? Also consider reducing Novolog correction to very sensitive (0-6) with meal coverage?  Thanks,  Adah Perl, RN, BC-ADM Inpatient Diabetes Coordinator Pager [phone]  (8a-5p)  Addendum: Spoke with patient regarding DM management.  She states that blood sugars are pretty stable at home and she does not see peaks in her blood sugars the mornings at home.  May benefit from a CGM to help with monitoring? Discussed changes in DM medications in the hospital.  Patient would like new endocrinologist.

## 2020-11-12 NOTE — Progress Notes (Signed)
[facility] NEPHROLOGY PROGRESS NOTE  Assessment/ Plan:  Dialysis Orders:  Outpatient nephrologist: Dr. Joylene Grapes PD 7x week 5 exchanges, no day dwell, EDW 63kg, Dwell time 1.5hr, fill volume 2L Using 4 exchanges 1.5% dextrose and 1 exchange 2.5% dextrose  #Intractable nausea vomiting probably due to diabetic gastroparesis: Continue supportive care including antiemetic.  Patient said she follows with GI.  # ESRD on PD: Tolerating PD well.  We will continue PD tonight using mostly 1.5% solution.  Volume status acceptable.  # Anemia: Hemoglobin acceptable.  No ESA at this time.  # Secondary hyperparathyroidism: Currently with nausea and vomiting and not really eating.  Monitor calcium and phosphorus level.  # HTN/volume: Presented with hypertensive emergency.  Blood pressure seems to be gradually improving with multiple antihypertensives.  Volume acceptable.  Subjective: Seen and examined at bedside.  Still having nausea and vomiting.  Denies chest pain, shortness of breath, headache or dizziness. Objective Vital signs in last 24 hours: Vitals:   11/12/20 0500 11/12/20 0600 11/12/20 0801 11/12/20 0952  BP: (!) 161/101 (!) 172/95 128/76 (!) 179/106  Pulse: (!) 114 (!) 113 (!) 107   Resp:   20   Temp:   98.6 F (37 C)   TempSrc:   Oral   SpO2: 94% 95% 93%   Weight:      Height:       Weight change: -1.632 kg  Intake/Output Summary (Last 24 hours) at 11/12/2020 1003 Last data filed at 11/12/2020 0300 Gross per 24 hour  Intake 340 ml  Output 800 ml  Net -460 ml       Labs: Basic Metabolic Panel: Recent Labs  Lab 11/11/20 0147 11/11/20 0515 11/12/20 0346  NA 140 139 131*  K 4.1 4.1 3.9  CL 106 107 97*  CO2 19* 18* 18*  GLUCOSE 92 106* 437*  BUN 51* 52* 51*  CREATININE 8.55* 8.76* 8.56*  CALCIUM 9.3 9.4 9.1   Liver Function Tests: Recent Labs  Lab 11/10/20 1501  AST 15  ALT 13  ALKPHOS 70  BILITOT 0.4  PROT 7.9  ALBUMIN 4.0   Recent Labs   Lab 11/10/20 1501  LIPASE 130*   No results for input(s): AMMONIA in the last 168 hours. CBC: Recent Labs  Lab 11/10/20 1501 11/11/20 0515  WBC 11.6* 11.3*  HGB 10.9* 10.0*  HCT 33.3* 30.9*  MCV 81.4 84.9  PLT 400 330   Cardiac Enzymes: No results for input(s): CKTOTAL, CKMB, CKMBINDEX, TROPONINI in the last 168 hours. CBG: Recent Labs  Lab 11/11/20 0807 11/11/20 1152 11/11/20 1549 11/11/20 1924 11/12/20 0641  GLUCAP 227* 266* 196* 139* 352*    Iron Studies: No results for input(s): IRON, TIBC, TRANSFERRIN, FERRITIN in the last 72 hours. Studies/Results: No results found.  Medications: Infusions:  dialysis solution 1.5% low-MG/low-CA     dialysis solution 2.5% low-MG/low-CA     ondansetron (ZOFRAN) IV     promethazine (PHENERGAN) injection (IM or IVPB)      Scheduled Medications:  amLODipine  10 mg Oral Daily   carvedilol  25 mg Oral BID WC   Chlorhexidine Gluconate Cloth  6 each Topical Daily   cloNIDine  0.1 mg Oral Daily   gentamicin cream  1 application Topical Daily   heparin  5,000 Units Subcutaneous Q8H   hydrALAZINE  100 mg Oral TID   insulin aspart  0-15 Units Subcutaneous TID WC   insulin aspart  0-5 Units Subcutaneous QHS   insulin aspart  3 Units  Subcutaneous TID WC   insulin glargine-yfgn  25 Units Subcutaneous Daily   losartan  100 mg Oral Daily   metoCLOPramide  5 mg Oral TID AC & HS   multivitamin  1 tablet Oral QHS   pantoprazole  40 mg Oral Daily    have reviewed scheduled and prn medications.  Physical Exam: General:NAD, comfortable Heart:RRR, s1s2 nl Lungs:clear b/l, no crackle Abdomen:soft, Non-tender, non-distended Extremities:No edema Dialysis Access: PD catheter in place  Taveon Enyeart Prasad Lianni Kanaan 11/12/2020,10:03 AM  LOS: 2 days

## 2020-11-13 DIAGNOSIS — E1065 Type 1 diabetes mellitus with hyperglycemia: Secondary | ICD-10-CM | POA: Diagnosis not present

## 2020-11-13 LAB — BASIC METABOLIC PANEL
Anion gap: 11 (ref 5–15)
BUN: 56 mg/dL — ABNORMAL HIGH (ref 6–20)
CO2: 23 mmol/L (ref 22–32)
Calcium: 8.2 mg/dL — ABNORMAL LOW (ref 8.9–10.3)
Chloride: 99 mmol/L (ref 98–111)
Creatinine, Ser: 9.89 mg/dL — ABNORMAL HIGH (ref 0.44–1.00)
GFR, Estimated: 5 mL/min — ABNORMAL LOW (ref >60)
Glucose, Bld: 144 mg/dL — ABNORMAL HIGH (ref 70–99)
Potassium: 3.4 mmol/L — ABNORMAL LOW (ref 3.5–5.1)
Sodium: 133 mmol/L — ABNORMAL LOW (ref 135–145)

## 2020-11-13 LAB — GLUCOSE, CAPILLARY
Glucose-Capillary: 124 mg/dL — ABNORMAL HIGH (ref 70–99)
Glucose-Capillary: 222 mg/dL — ABNORMAL HIGH (ref 70–99)

## 2020-11-13 MED ORDER — LANTUS SOLOSTAR 100 UNIT/ML ~~LOC~~ SOPN: 20.0000 [IU] | PEN_INJECTOR | SUBCUTANEOUS | 0 refills | Status: DC

## 2020-11-13 MED ORDER — LANTUS SOLOSTAR 100 UNIT/ML ~~LOC~~ SOPN: 16.0000 [IU] | PEN_INJECTOR | SUBCUTANEOUS | 0 refills | Status: DC

## 2020-11-13 MED ORDER — ONDANSETRON 4 MG PO TBDP: 4.0000 mg | ORAL_TABLET | Freq: Three times a day (TID) | ORAL | 0 refills | Status: DC | PRN

## 2020-11-13 NOTE — Progress Notes (Signed)
Patient ID: Barbara Donovan, female   DOB: September 28, 1990, 30 y.o.   MRN: [id number] S: Feels much better this morning. O:BP 139/86   Pulse 96   Temp 98.1 F (36.7 C) (Oral)   Resp 16   Ht 5\' 6"  (1.676 m)   Wt 64.8 kg   SpO2 99%   Breastfeeding No   BMI 23.06 kg/m   Intake/Output Summary (Last 24 hours) at 11/13/2020 0907 Last data filed at 11/13/2020 0700 Gross per 24 hour  Intake 10433 ml  Output 10313 ml  Net 120 ml   Intake/Output: I/O last 3 completed shifts: In: 53299 [P.O.:600; MEQAS:3419] Out: 11113 [Emesis/NG output:800; QQIWL:79892]  Intake/Output this shift:  No intake/output data recorded. Weight change: -1 kg Gen:NAD CVS: RRR Resp:CTA Abd: +BS, soft, NT/ND Ext: no edema  Recent Labs  Lab 11/10/20 1501 11/10/20 1752 11/10/20 2049 11/11/20 0147 11/11/20 0515 11/12/20 0346 11/13/20 0154  NA 137 138 137 140 139 131* 133*  K 4.3 4.4 4.2 4.1 4.1 3.9 3.4*  CL 99 102 104 106 107 97* 99  CO2 20* 20* 18* 19* 18* 18* 23  GLUCOSE 253* 279* 295* 92 106* 437* 144*  BUN 52* 52* 52* 51* 52* 51* 56*  CREATININE 8.54* 8.18* 8.52* 8.55* 8.76* 8.56* 9.89*  ALBUMIN 4.0  --   --   --   --   --   --   CALCIUM 10.2 9.2 9.4 9.3 9.4 9.1 8.2*  AST 15  --   --   --   --   --   --   ALT 13  --   --   --   --   --   --    Liver Function Tests: Recent Labs  Lab 11/10/20 1501  AST 15  ALT 13  ALKPHOS 70  BILITOT 0.4  PROT 7.9  ALBUMIN 4.0   Recent Labs  Lab 11/10/20 1501  LIPASE 130*   No results for input(s): AMMONIA in the last 168 hours. CBC: Recent Labs  Lab 11/10/20 1501 11/11/20 0515  WBC 11.6* 11.3*  HGB 10.9* 10.0*  HCT 33.3* 30.9*  MCV 81.4 84.9  PLT 400 330   Cardiac Enzymes: No results for input(s): CKTOTAL, CKMB, CKMBINDEX, TROPONINI in the last 168 hours. CBG: Recent Labs  Lab 11/12/20 1140 11/12/20 1625 11/12/20 1956 11/13/20 0634 11/13/20 0815  GLUCAP 211* 112* 150* 124* 222*    Iron Studies: No results for input(s): IRON, TIBC,  TRANSFERRIN, FERRITIN in the last 72 hours. Studies/Results: No results found.  amLODipine  10 mg Oral Daily   carvedilol  25 mg Oral BID WC   Chlorhexidine Gluconate Cloth  6 each Topical Daily   cloNIDine  0.1 mg Oral Daily   gentamicin cream  1 application Topical Daily   heparin  5,000 Units Subcutaneous Q8H   hydrALAZINE  100 mg Oral TID   insulin aspart  0-15 Units Subcutaneous TID WC   insulin aspart  0-5 Units Subcutaneous QHS   insulin aspart  3 Units Subcutaneous TID WC   insulin glargine-yfgn  25 Units Subcutaneous Daily   losartan  100 mg Oral Daily   metoCLOPramide  5 mg Oral TID AC & HS   multivitamin  1 tablet Oral QHS   pantoprazole  40 mg Oral Daily    BMET    Component Value Date/Time   NA 133 (L) 11/13/2020 0154   NA 139 04/05/2019 1435   K 3.4 (L) 11/13/2020 0154   CL  99 11/13/2020 0154   CO2 23 11/13/2020 0154   GLUCOSE 144 (H) 11/13/2020 0154   BUN 56 (H) 11/13/2020 0154   BUN 31 (H) 04/05/2019 1435   CREATININE 9.89 (H) 11/13/2020 0154   CALCIUM 8.2 (L) 11/13/2020 0154   GFRNONAA 5 (L) 11/13/2020 0154   GFRAA 30 (L) 09/15/2019 0358   CBC    Component Value Date/Time   WBC 11.3 (H) 11/11/2020 0515   RBC 3.64 (L) 11/11/2020 0515   HGB 10.0 (L) 11/11/2020 0515   HGB 9.7 (L) 12/22/2017 1459   HGB 13.0 07/07/2017 0000   HCT 30.9 (L) 11/11/2020 0515   HCT 28.5 (L) 12/22/2017 1459   HCT 39 07/07/2017 0000   PLT 330 11/11/2020 0515   PLT 427 12/22/2017 1459   PLT 374 07/07/2017 0000   MCV 84.9 11/11/2020 0515   MCV 82 12/22/2017 1459   MCH 27.5 11/11/2020 0515   MCHC 32.4 11/11/2020 0515   RDW 14.6 11/11/2020 0515   RDW 13.0 12/22/2017 1459   LYMPHSABS 1.2 08/19/2020 1934   MONOABS 0.4 08/19/2020 1934   EOSABS 0.0 08/19/2020 1934   BASOSABS 0.1 08/19/2020 1934   Dialysis Orders:  Outpatient nephrologist: Dr. Joylene Grapes PD 7x week 5 exchanges, no day dwell, EDW 63kg, Dwell time 1.5hr, fill volume 2L Using 4 exchanges 1.5% dextrose and 1  exchange 2.5% dextrose  Assessment/Plan:  Refractory nausea and vomiting - due to diabetic gastroparesis.  Improved and tolerating meals. ESRD - on CCPD and tolerated it well. Nonketotic hyperosmolar state - missed insulin dose, improved.  Anemia of ESRD - will need to start ESA Hypertensive urgency - improved with po meds. 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  Disposition - per patient she will be going home today but haven't seen a note from primary regarding discharge yet.   Donetta Potts, MD [employer] [phone]

## 2020-11-13 NOTE — Discharge Instructions (Signed)
Follow with Primary MD Fanny Bien, MD in 7 days   Get CBC, CMP,  -  checked next visit within 1 week by Primary MD   Activity: As tolerated with Full fall precautions use walker/cane & assistance as needed  Disposition Home   Diet: Renal-low carbohydrate diet, 1.5 L fluid restriction per day  Accuchecks 4 times/day, Once in AM empty stomach and then before each meal. Log in all results and show them to your Prim.MD in 3 days. If any glucose reading is under 80 or above 300 call your Prim MD immidiately. Follow Low glucose instructions for glucose under 80 as instructed.  Special Instructions: If you have smoked or chewed Tobacco  in the last 2 yrs please stop smoking, stop any regular Alcohol  and or any Recreational drug use.  On your next visit with your primary care physician please Get Medicines reviewed and adjusted.  Please request your Prim.MD to go over all Hospital Tests and Procedure/Radiological results at the follow up, please get all Hospital records sent to your Prim MD by signing hospital release before you go home.  If you experience worsening of your admission symptoms, develop shortness of breath, life threatening emergency, suicidal or homicidal thoughts you must seek medical attention immediately by calling 911 or calling your MD immediately  if symptoms less severe.  You Must read complete instructions/literature along with all the possible adverse reactions/side effects for all the Medicines you take and that have been prescribed to you. Take any new Medicines after you have completely understood and accpet all the possible adverse reactions/side effects.

## 2020-11-13 NOTE — Plan of Care (Signed)
                                      [place]                            [address] [place], [place] [address]      Barbara Donovan was admitted to the Hospital on 11/10/2020 and Discharged  11/13/2020 and should be excused from work/school   for 5  days starting from date -  11/10/2020 , may return to work/school without any restrictions.  Call Lala Lund MD, [facility]  [phone] with questions.  Lala Lund M.D on 11/13/2020,at 10:40 AM  [facility]   Office  [phone]

## 2020-11-13 NOTE — Discharge Summary (Addendum)
Barbara Donovan:[id number] DOB: December 05, 1990 DOA: 11/10/2020  PCP: Fanny Bien, MD  Admit date: 11/10/2020  Discharge date: 11/13/2020  Admitted From: Home   Disposition:  Home   Recommendations for Outpatient Follow-up:   Follow up with PCP in 1-2 weeks  PCP Please obtain BMP/CBC, 2 view CXR in 1week,  (see Discharge instructions)   PCP Please follow up on the following pending results:    Home Health: None   Equipment/Devices: None  Consultations: Renal Discharge Condition: Stable    CODE STATUS: Full    Diet Recommendation: Renal-low carbohydrate diet, 1.5 L fluid restriction per day  Diet Order             Diet renal with fluid restriction Fluid restriction: 1800 mL Fluid; Room service appropriate? Yes; Fluid consistency: Thin  Diet effective now                    Chief Complaint  Patient presents with   Emesis     Brief history of present illness from the day of admission and additional interim summary    Barbara Donovan is a 30 y.o. female with medical history significant for end stage kidney disease on home peritoneal dialysis,  diabetic retinopathy, type 1 diabetes on insulin, presented emerge apartment nausea, vomiting that this morning when she woke up, she missed taking her insulin and her peritoneal dialysis came to the ER with nausea vomiting and not feeling better was diagnosed with hypertensive urgency, nonketotic hyperosmolar state and admitted for further treatment.                                                                 Hospital Course      Nonketotic hyperosmolar state versus mild early DKA in a patient with DM type I who missed her insulin dose - she missed her insulin as she is having nausea vomiting due to diabetic gastroparesis, she has been started on  Lantus and sliding scale with good sugar control, has been counseled on compliance will be discharged home, home Lantus dose increased from 16 to 20 units.  Requested to check CBGs q. [facility] maintain a logbook and show it to her PCP within a week next visit for further adjustment.  She is now symptom-free.  Note she had very and anion gap which could have been due to mild early DKA versus dehydration in a patient with ESRD.   2. ESRD .  On peritoneal dialysis.  Seen by nephrology will commence her home regimen postdischarge.   3.  Diabetic gastroparesis and diabetic retinopathy.  Currently no nausea vomiting.  Renew home Rx, follow outpatient with her [place] [facility] physician Dr. Fuller Plan in 1 to 2 weeks post discharge.   4.  Hypertensive urgency.  Due to  nausea vomiting and throwing up of blood pressure medications, now stable.    Lab Results  Component Value Date   HGBA1C 8.8 (H) 11/10/2020    Discharge diagnosis     Principal Problem:   Type 1 diabetes mellitus with hyperglycemia (HCC) Active Problems:   Intractable nausea and vomiting   Hypertensive urgency   ESRD (end stage renal disease) [facility])    Discharge instructions    Discharge Instructions     Discharge instructions   Complete by: As directed    Follow with Primary MD Fanny Bien, MD in 7 days   Get CBC, CMP,  -  checked next visit within 1 week by Primary MD   Activity: As tolerated with Full fall precautions use walker/cane & assistance as needed  Disposition Home   Diet: Renal-low carbohydrate diet, 1.5 L fluid restriction per day  Accuchecks 4 times/day, Once in AM empty stomach and then before each meal. Log in all results and show them to your Prim.MD in 3 days. If any glucose reading is under 80 or above 300 call your Prim MD immidiately. Follow Low glucose instructions for glucose under 80 as instructed.  Special Instructions: If you have smoked or chewed Tobacco  in the last 2 yrs please stop smoking,  stop any regular Alcohol  and or any Recreational drug use.  On your next visit with your primary care physician please Get Medicines reviewed and adjusted.  Please request your Prim.MD to go over all Hospital Tests and Procedure/Radiological results at the follow up, please get all Hospital records sent to your Prim MD by signing hospital release before you go home.  If you experience worsening of your admission symptoms, develop shortness of breath, life threatening emergency, suicidal or homicidal thoughts you must seek medical attention immediately by calling 911 or calling your MD immediately  if symptoms less severe.  You Must read complete instructions/literature along with all the possible adverse reactions/side effects for all the Medicines you take and that have been prescribed to you. Take any new Medicines after you have completely understood and accpet all the possible adverse reactions/side effects.   Discharge wound care:   Complete by: As directed    Exit site care  - Clean skin near exit site with chloraprep swab sticks.  Starting at catheter, use circular pattern around exit site, moving towards outer edges of area covered by dressing.  Apply gentamicin cream to site once daily.  Cover with dry dressing.   Increase activity slowly   Complete by: As directed        Discharge Medications   Allergies as of 11/13/2020   No Known Allergies      Medication List     STOP taking these medications    HYDROcodone-acetaminophen 5-325 MG tablet Commonly known as: NORCO/VICODIN       TAKE these medications    acetaminophen 500 MG tablet Commonly known as: TYLENOL Take 1,000 mg by mouth every 6 (six) hours as needed for mild pain.   amLODipine 10 MG tablet Commonly known as: NORVASC Take 1 tablet (10 mg total) by mouth daily.   carvedilol 25 MG tablet Commonly known as: COREG Take 1 tablet (25 mg total) by mouth 2 (two) times daily.   Continuous Blood Gluc Sensor  Misc 1 each by Does not apply route as directed. Use as directed every 14 days. May dispense FreeStyle [employer] or similar.   gentamicin cream 0.1 % Commonly known as: GARAMYCIN  Apply 1 application topically daily.   hydrALAZINE 50 MG tablet Commonly known as: APRESOLINE Take 2 tablets (100 mg total) by mouth 3 (three) times daily. What changed: how much to take   insulin lispro 100 UNIT/ML KwikPen Commonly known as: HumaLOG KwikPen Inject 2 Units into the skin 3 (three) times daily with meals.   Lantus SoloStar 100 UNIT/ML Solostar Pen Generic drug: insulin glargine Inject 20 Units into the skin every morning. What changed: how much to take   losartan 50 MG tablet Commonly known as: COZAAR Take 2 tablets (100 mg total) by mouth daily.   metoCLOPramide 5 MG tablet Commonly known as: REGLAN Take 1 tablet (5 mg total) by mouth 4 (four) times daily -  before meals and at bedtime.   multivitamin Tabs tablet Take 1 tablet by mouth at bedtime.   ondansetron 4 MG disintegrating tablet Commonly known as: Zofran ODT Take 1 tablet (4 mg total) by mouth every 8 (eight) hours as needed for nausea or vomiting.               Discharge Care Instructions  (From admission, onward)           Start     Ordered   11/13/20 0000  Discharge wound care:       Comments: Exit site care  - Clean skin near exit site with chloraprep swab sticks.  Starting at catheter, use circular pattern around exit site, moving towards outer edges of area covered by dressing.  Apply gentamicin cream to site once daily.  Cover with dry dressing.   11/13/20 0915             Follow-up Information     Fanny Bien, MD. Schedule an appointment as soon as possible for a visit in 1 week(s).   Specialty: Family Medicine Contact information: [address] [place] [address] [phone]         Sueanne Margarita, MD .   Specialty: Cardiology Contact information: [phone] [address].  [address] [place] [address] [phone]                 Major procedures and Radiology Reports - PLEASE review detailed and final reports thoroughly  -       Today   Subjective    Morris Markham today has no headache,no chest abdominal pain,no new weakness tingling or numbness, feels much better wants to go home today.   Objective   Blood pressure 139/86, pulse 96, temperature 98.1 F (36.7 C), temperature source Oral, resp. rate 16, height 5\' 6"  (1.676 m), weight 64.8 kg, SpO2 99 %, not currently breastfeeding.   Intake/Output Summary (Last 24 hours) at 11/13/2020 0929 Last data filed at 11/13/2020 0700 Gross per 24 hour  Intake 10433 ml  Output 10313 ml  Net 120 ml    Exam  Awake Alert, No new F.N deficits, Normal affect [place].AT,PERRAL Supple Neck,No JVD, No cervical lymphadenopathy appriciated.  Symmetrical Chest wall movement, Good air movement bilaterally, CTAB RRR,No Gallops,Rubs or new Murmurs, No Parasternal Heave +ve B.Sounds, Abd Soft, Non tender, No organomegaly appriciated, No rebound -guarding or rigidity. No Cyanosis, Clubbing or edema, No new Rash or bruise   Data Review   CBC w Diff:  Lab Results  Component Value Date   WBC 11.3 (H) 11/11/2020   HGB 10.0 (L) 11/11/2020   HGB 9.7 (L) 12/22/2017   HGB 13.0 07/07/2017   HCT 30.9 (L) 11/11/2020   HCT 28.5 (L)  12/22/2017   HCT 39 07/07/2017   PLT 330 11/11/2020   PLT 427 12/22/2017   PLT 374 07/07/2017   LYMPHOPCT 13 08/19/2020   MONOPCT 4 08/19/2020   EOSPCT 0 08/19/2020   BASOPCT 1 08/19/2020    CMP:  Lab Results  Component Value Date   NA 133 (L) 11/13/2020   NA 139 04/05/2019   K 3.4 (L) 11/13/2020   CL 99 11/13/2020   CO2 23 11/13/2020   BUN 56 (H) 11/13/2020   BUN 31 (H) 04/05/2019   CREATININE 9.89 (H) 11/13/2020   PROT 7.9 11/10/2020   PROT 4.5 (L) 12/22/2017   ALBUMIN 4.0 11/10/2020   ALBUMIN 2.2 (L) 12/22/2017   BILITOT 0.4 11/10/2020   BILITOT <0.2  12/22/2017   ALKPHOS 70 11/10/2020   AST 15 11/10/2020   ALT 13 11/10/2020  .   Total Time in preparing paper work, data evaluation and todays exam - 40 minutes  Lala Lund M.D on 11/13/2020 at 9:29 AM  [facility]

## 2020-11-24 ENCOUNTER — Other Ambulatory Visit: Payer: Self-pay

## 2020-11-24 ENCOUNTER — Emergency Department (HOSPITAL_COMMUNITY)
Admission: EM | Admit: 2020-11-24 | Discharge: 2020-11-25 | Disposition: A | Discharge status: Home / Self Care | Payer: 59 | Attending: Emergency Medicine | Admitting: Emergency Medicine

## 2020-11-24 DIAGNOSIS — N9489 Other specified conditions associated with female genital organs and menstrual cycle: Secondary | ICD-10-CM | POA: Diagnosis not present

## 2020-11-24 DIAGNOSIS — R000 Tachycardia, unspecified: Secondary | ICD-10-CM | POA: Insufficient documentation

## 2020-11-24 DIAGNOSIS — E1022 Type 1 diabetes mellitus with diabetic chronic kidney disease: Secondary | ICD-10-CM | POA: Diagnosis not present

## 2020-11-24 DIAGNOSIS — E1043 Type 1 diabetes mellitus with diabetic autonomic (poly)neuropathy: Secondary | ICD-10-CM | POA: Insufficient documentation

## 2020-11-24 DIAGNOSIS — Z992 Dependence on renal dialysis: Secondary | ICD-10-CM | POA: Insufficient documentation

## 2020-11-24 DIAGNOSIS — E1010 Type 1 diabetes mellitus with ketoacidosis without coma: Secondary | ICD-10-CM | POA: Diagnosis not present

## 2020-11-24 DIAGNOSIS — I120 Hypertensive chronic kidney disease with stage 5 chronic kidney disease or end stage renal disease: Secondary | ICD-10-CM | POA: Diagnosis not present

## 2020-11-24 DIAGNOSIS — Z79899 Other long term (current) drug therapy: Secondary | ICD-10-CM | POA: Diagnosis not present

## 2020-11-24 DIAGNOSIS — R112 Nausea with vomiting, unspecified: Secondary | ICD-10-CM | POA: Insufficient documentation

## 2020-11-24 DIAGNOSIS — Z8616 Personal history of COVID-19: Secondary | ICD-10-CM | POA: Diagnosis not present

## 2020-11-24 DIAGNOSIS — D631 Anemia in chronic kidney disease: Secondary | ICD-10-CM | POA: Insufficient documentation

## 2020-11-24 DIAGNOSIS — E10319 Type 1 diabetes mellitus with unspecified diabetic retinopathy without macular edema: Secondary | ICD-10-CM | POA: Insufficient documentation

## 2020-11-24 DIAGNOSIS — N186 End stage renal disease: Secondary | ICD-10-CM | POA: Diagnosis not present

## 2020-11-24 DIAGNOSIS — Z794 Long term (current) use of insulin: Secondary | ICD-10-CM | POA: Diagnosis not present

## 2020-11-24 DIAGNOSIS — E860 Dehydration: Secondary | ICD-10-CM

## 2020-11-24 DIAGNOSIS — J45909 Unspecified asthma, uncomplicated: Secondary | ICD-10-CM | POA: Diagnosis not present

## 2020-11-24 LAB — COMPREHENSIVE METABOLIC PANEL
ALT: 14 U/L (ref 0–44)
AST: 22 U/L (ref 15–41)
Albumin: 3.3 g/dL — ABNORMAL LOW (ref 3.5–5.0)
Alkaline Phosphatase: 67 U/L (ref 38–126)
Anion gap: 15 (ref 5–15)
BUN: 46 mg/dL — ABNORMAL HIGH (ref 6–20)
CO2: 23 mmol/L (ref 22–32)
Calcium: 9.6 mg/dL (ref 8.9–10.3)
Chloride: 100 mmol/L (ref 98–111)
Creatinine, Ser: 9.61 mg/dL — ABNORMAL HIGH (ref 0.44–1.00)
GFR, Estimated: 5 mL/min — ABNORMAL LOW (ref >60)
Glucose, Bld: 115 mg/dL — ABNORMAL HIGH (ref 70–99)
Potassium: 3.7 mmol/L (ref 3.5–5.1)
Sodium: 138 mmol/L (ref 135–145)
Total Bilirubin: 0.6 mg/dL (ref 0.3–1.2)
Total Protein: 7.3 g/dL (ref 6.5–8.1)

## 2020-11-24 LAB — LIPASE, BLOOD: Lipase: 42 U/L (ref 11–51)

## 2020-11-24 LAB — I-STAT BETA HCG BLOOD, ED (MC, WL, AP ONLY): I-stat hCG, quantitative: <5 m[IU]/mL (ref <5)

## 2020-11-24 LAB — CBC WITH DIFFERENTIAL/PLATELET
Abs Immature Granulocytes: 0.05 10*3/uL (ref 0.00–0.07)
Basophils Absolute: 0.1 10*3/uL (ref 0.0–0.1)
Basophils Relative: 1 %
Eosinophils Absolute: 0 10*3/uL (ref 0.0–0.5)
Eosinophils Relative: 0 %
HCT: 18.2 % — ABNORMAL LOW (ref 36.0–46.0)
Hemoglobin: 5.8 g/dL — CL (ref 12.0–15.0)
Immature Granulocytes: 0 %
Lymphocytes Relative: 16 %
Lymphs Abs: 2.3 10*3/uL (ref 0.7–4.0)
MCH: 27.2 pg (ref 26.0–34.0)
MCHC: 31.9 g/dL (ref 30.0–36.0)
MCV: 85.4 fL (ref 80.0–100.0)
Monocytes Absolute: 0.8 10*3/uL (ref 0.1–1.0)
Monocytes Relative: 6 %
Neutro Abs: 11.1 10*3/uL — ABNORMAL HIGH (ref 1.7–7.7)
Neutrophils Relative %: 77 %
Platelets: 775 10*3/uL — ABNORMAL HIGH (ref 150–400)
RBC: 2.13 MIL/uL — ABNORMAL LOW (ref 3.87–5.11)
RDW: 13.7 % (ref 11.5–15.5)
WBC: 14.4 10*3/uL — ABNORMAL HIGH (ref 4.0–10.5)
nRBC: 0 % (ref 0.0–0.2)

## 2020-11-24 LAB — POC OCCULT BLOOD, ED: Fecal Occult Bld: NEGATIVE

## 2020-11-24 LAB — CBG MONITORING, ED: Glucose-Capillary: 137 mg/dL — ABNORMAL HIGH (ref 70–99)

## 2020-11-24 MED ORDER — SODIUM CHLORIDE 0.9 % IV SOLN: 10.0000 mL/h | Freq: Once | INTRAVENOUS | Status: DC

## 2020-11-24 MED ORDER — ONDANSETRON HCL 4 MG/2ML IJ SOLN
4.0000 mg | Freq: Once | INTRAMUSCULAR | Status: AC
  Administered 2020-11-25: 4 mg via INTRAVENOUS
  Filled 2020-11-24: qty 2

## 2020-11-24 NOTE — ED Provider Notes (Signed)
[facility] EMERGENCY DEPARTMENT Provider Note   CSN: [id number] Arrival date & time: 11/24/20  1921     History Chief Complaint  Patient presents with   Nausea   Emesis    Barbara Donovan is a 30 y.o. female.  Patient with history of T1DM, ESRD on peritoneal dialysis presents today with complaint of nausea and vomiting. She states that same has been persisting for the past 2-3 days with 10 episodes of non-bloody emesis a day. Additionally, she states that her last bowel movement was earlier today and was 'like rocks' however not bloody or melanous. She states that she last did peritoneal dialysis last night, she does this every night. She does state that she took her Lantus as prescribed this morning and has not missed any doses of medications. Of note, patient was recently admitted for similar symptoms (10/24-10/27), was suspected to have early DKA despite no ketosis given that with previous episode she had missed doses of insulin. She states that she had resolution of symptoms at time of discharge. Denies fevers, chills, headache, chest pain, shortness of breath, abdominal pain. Additionally, patient denies recent NSAID use. She does not smoke, drink alcohol, and no recreational of IVDU.  The history is provided by the patient. No language interpreter was used.  Emesis Associated symptoms: no abdominal pain, no chills, no cough, no diarrhea, no fever and no headaches       Past Medical History:  Diagnosis Date   Asthma    as a child, no problems as an adult, no inhaler   Cataract    NS OU   Chronic hypertension during pregnancy, antepartum 08/19/2017   Dehydration 01/28/2018   Depression during pregnancy, antepartum 07/07/2017   6/20: Short trial of zoloft previously, reports didn't help much but also didn't give it a chance Discussed r/b/a SSRIs in pregnancy, agrees to try Zoloft again, rx sent No SI/HI/red flags   Diabetes ([place])    TYPE I. A1C 7.5% 05/31/20    Diabetic retinopathy ([place]) 06/09/2017   07/2017 with bilateral severe diabetic non-proliferative retinopathy with macular edema.   ESRD (end stage renal disease) on dialysis ([place])    4 x week  M T TH F   HTN (hypertension)    Hypertensive retinopathy    OU   Hypokalemia 01/22/2018   Hypomagnesemia 01/28/2018   Intractable nausea and vomiting 01/22/2018   Intrauterine growth restriction (IUGR) affecting care of mother 12/22/2017   Morbid obesity ([place])    Nephropathy, diabetic ([place]) 12/29/2017   Proteinuria due to type 1 diabetes mellitus ([place]) 11/02/2017   Baseline Pr: Cr 10.23   Severe hyperemesis gravidarum 10/30/2017   Type I diabetes mellitus ([place]) 07/07/2017   Current Diabetic Medications:  Insulin  [x]  Aspirin 81 mg daily after 12 weeks (? A2/B GDM)  Required Referrals for A1GDM or A2GDM: [x]  Diabetes Education and Testing Supplies [x]  Nutrition Cousult  For A2/B GDM or higher classes of DM [x]  Diabetes Education and Testing Supplies [x]  Nutrition Counsult [x]  Fetal ECHO after 20 weeks  [x]  Eye exam for retina evaluation - severe retinopathy 7/19  Base   Ventricular septal defect (VSD) of fetus in singleton pregnancy, antepartum 09/30/2017   May go to newborn nursery per Dr. Lenard Simmer Echo prior to discharge    Patient Active Problem List   Diagnosis Date Noted   DKA, type 1 ([place]) 11/10/2020   Type 1 diabetes mellitus with hyperglycemia ([place]) 11/10/2020   ESRD (end stage renal disease) ([place])  11/10/2020   Renal failure (ARF), acute on chronic ([place]) 06/01/2020   ARF (acute renal failure) (HCC)    Nausea & vomiting    Diabetic gastroparesis ([place]) 03/04/2020   Anemia due to stage 5 chronic kidney disease ([place]) 03/04/2020   Diabetic ketoacidosis without coma associated with type 1 diabetes mellitus ([place]) 02/09/2020   Hypothermia 01/24/2020   DKA (diabetic ketoacidosis) ([place]) 09/14/2019   COVID-19 virus infection 09/14/2019   SOB (shortness of breath) 03/27/2018   Hypomagnesemia  01/28/2018   Dehydration 01/28/2018   Intractable nausea and vomiting 01/22/2018   Hypertensive urgency 01/22/2018   Hypokalemia 01/22/2018   Nephropathy, diabetic ([place]) 12/29/2017   Proteinuria due to type 1 diabetes mellitus ([place]) 11/02/2017   Chronic hypertension 08/21/2017   Uncontrolled type 1 diabetes mellitus with stage 5 chronic kidney disease not on chronic dialysis, with long-term current use of insulin 07/07/2017   Diabetic retinopathy ([place]) 06/09/2017   Acute depression 11/02/2014   Asthma 10/30/2013    Past Surgical History:  Procedure Laterality Date   25 GAUGE PARS PLANA VITRECTOMY WITH 20 GAUGE MVR PORT FOR MACULAR HOLE Left 07/20/2018   Procedure: 25 GAUGE PARS PLANA VITRECTOMY LEFT EYE;  Surgeon: Bernarda Caffey, MD;  Location: [place];  Service: Ophthalmology;  Laterality: Left;   CAPD INSERTION N/A 09/10/2020   Procedure: LAPAROSCOPIC INSERTION CONTINUOUS AMBULATORY PERITONEAL DIALYSIS  (CAPD) CATHETER;  Surgeon: Serafina Mitchell, MD;  Location: [place];  Service: Vascular;  Laterality: N/A;   CESAREAN SECTION N/A 12/26/2017   Procedure: CESAREAN SECTION;  Surgeon: Osborne Oman, MD;  Location: [place];  Service: Obstetrics;  Laterality: N/A;   EYE EXAMINATION UNDER ANESTHESIA Right 07/20/2018   Procedure: Eye Exam Under Anesthesia RIGHT EYE;  Surgeon: Bernarda Caffey, MD;  Location: [place];  Service: Ophthalmology;  Laterality: Right;   EYE SURGERY Left 07/2018   GAS INSERTION Left 07/19/2019   Procedure: INSERTION OF GAS;  Surgeon: Bernarda Caffey, MD;  Location: [place];  Service: Ophthalmology;  Laterality: Left;  SF6   INJECTION OF SILICONE OIL Left 01/23/1094   Procedure: Injection Of Silicone Oil LEFT EYE;  Surgeon: Bernarda Caffey, MD;  Location: [place];  Service: Ophthalmology;  Laterality: Left;   IR FLUORO GUIDE CV LINE RIGHT  06/02/2020   IR US GUIDE VASC ACCESS RIGHT  06/02/2020   LASER PHOTO ABLATION Right 07/20/2018   Procedure: Laser Photo Ablation RIGHT EYE;   Surgeon: Bernarda Caffey, MD;  Location: [place];  Service: Ophthalmology;  Laterality: Right;   MEMBRANE PEEL Left 07/20/2018   Procedure: Membrane Peel LEFT EYE;  Surgeon: Bernarda Caffey, MD;  Location: [place];  Service: Ophthalmology;  Laterality: Left;   MEMBRANE PEEL Left 07/19/2019   Procedure: MEMBRANE PEEL;  Surgeon: Bernarda Caffey, MD;  Location: [place];  Service: Ophthalmology;  Laterality: Left;   MITOMYCIN C APPLICATION Bilateral 0/04/5407   Procedure: Avastin Application;  Surgeon: Bernarda Caffey, MD;  Location: [place];  Service: Ophthalmology;  Laterality: Bilateral;   PHOTOCOAGULATION WITH LASER Left 07/20/2018   Procedure: Photocoagulation With Laser LEFT EYE;  Surgeon: Bernarda Caffey, MD;  Location: [place];  Service: Ophthalmology;  Laterality: Left;   PHOTOCOAGULATION WITH LASER Left 07/19/2019   Procedure: PHOTOCOAGULATION WITH LASER;  Surgeon: Bernarda Caffey, MD;  Location: [place];  Service: Ophthalmology;  Laterality: Left;   RETINAL DETACHMENT SURGERY Left 07/20/2018   Dr. Bernarda Caffey   SILICON OIL REMOVAL Left 07/19/2019   Procedure: 25g PARS PLANA VITRECTOMY WITH SILICON OIL REMOVAL;  Surgeon: Bernarda Caffey, MD;  Location: [facility] [place];  Service: Ophthalmology;  Laterality: Left;   WISDOM TOOTH EXTRACTION       OB History     Gravida  1   Para  1   Term      Preterm  1   AB      Living  1      SAB      IAB      Ectopic      Multiple  0   Live Births  1           Family History  Problem Relation Age of Onset   Diabetes Mother    Aneurysm Mother 3   Seizures Mother    Diabetes Father    Cataracts Father    COPD Father    Heart attack Father    Heart disease Father    Healthy Sister    Healthy Daughter    Stroke Maternal Grandfather    Amblyopia Neg Hx    Blindness Neg Hx    Glaucoma Neg Hx    Macular degeneration Neg Hx    Retinal detachment Neg Hx    Strabismus Neg Hx    Retinitis pigmentosa Neg Hx    Colon cancer Neg Hx    Stomach cancer Neg Hx     Esophageal cancer Neg Hx    Pancreatic cancer Neg Hx    Liver disease Neg Hx     Social History   Tobacco Use   Smoking status: Never   Smokeless tobacco: Never  Vaping Use   Vaping Use: Never used  Substance Use Topics   Alcohol use: Not Currently   Drug use: Never    Home Medications Prior to Admission medications   Medication Sig Start Date End Date Taking? Authorizing Provider  acetaminophen (TYLENOL) 500 MG tablet Take 1,000 mg by mouth every 6 (six) hours as needed for mild pain.    [provider]  amLODipine (NORVASC) 10 MG tablet Take 1 tablet (10 mg total) by mouth daily. 03/09/20   Mercy Riding, MD  carvedilol (COREG) 25 MG tablet Take 1 tablet (25 mg total) by mouth 2 (two) times daily. 03/07/19   Imogene Burn, PA-C  Continuous Blood Gluc Sensor MISC 1 each by Does not apply route as directed. Use as directed every 14 days. May dispense FreeStyle [employer] or similar. 01/27/20   Antonieta Pert, MD  gentamicin cream (GARAMYCIN) 0.1 % Apply 1 application topically daily. 09/10/20   [provider]  hydrALAZINE (APRESOLINE) 50 MG tablet Take 2 tablets (100 mg total) by mouth 3 (three) times daily. Patient taking differently: Take 50 mg by mouth 3 (three) times daily. 06/09/20   Geradine Girt, DO  insulin glargine (LANTUS SOLOSTAR) 100 UNIT/ML Solostar Pen Inject 20 Units into the skin every morning. 11/13/20   Thurnell Lose, MD  insulin lispro (HUMALOG KWIKPEN) 100 UNIT/ML KwikPen Inject 2 Units into the skin 3 (three) times daily with meals. 04/25/20   Renato Shin, MD  losartan (COZAAR) 50 MG tablet Take 2 tablets (100 mg total) by mouth daily. 06/09/20   Geradine Girt, DO  metoCLOPramide (REGLAN) 5 MG tablet Take 1 tablet (5 mg total) by mouth 4 (four) times daily -  before meals and at bedtime. 08/28/20   Levin Erp, PA  multivitamin (RENA-VIT) TABS tablet Take 1 tablet by mouth at bedtime. 03/08/20   Mercy Riding, MD  ondansetron  (  ZOFRAN ODT) 4 MG disintegrating tablet Take 1 tablet (4 mg total) by mouth every 8 (eight) hours as needed for nausea or vomiting. 11/13/20   Thurnell Lose, MD    Allergies    Patient has no known allergies.  Review of Systems   Review of Systems  Constitutional:  Negative for chills and fever.  Respiratory:  Negative for cough, shortness of breath, wheezing and stridor.   Cardiovascular:  Negative for chest pain.  Gastrointestinal:  Positive for nausea and vomiting. Negative for abdominal pain, anal bleeding, blood in stool and diarrhea.  Skin:  Negative for pallor.  Neurological:  Negative for dizziness, tremors, seizures, syncope, facial asymmetry, speech difficulty, weakness, light-headedness, numbness and headaches.  Psychiatric/Behavioral:  Negative for confusion and decreased concentration.   All other systems reviewed and are negative.  Physical Exam Updated Vital Signs BP 129/89   Pulse 95   Temp 99.3 F (37.4 C) (Oral)   Resp 14   SpO2 98%   Physical Exam Vitals and nursing note reviewed. Exam conducted with a chaperone present.  Constitutional:      Comments: Patient somewhat ill appearing actively vomiting in bed in no acute distress  HENT:     Head: Normocephalic and atraumatic.  Eyes:     Extraocular Movements: Extraocular movements intact.     Pupils: Pupils are equal, round, and reactive to light.  Cardiovascular:     Rate and Rhythm: Regular rhythm. Tachycardia present.     Heart sounds: Normal heart sounds.  Pulmonary:     Effort: Pulmonary effort is normal.     Breath sounds: Normal breath sounds.  Abdominal:     General: Abdomen is flat. Bowel sounds are normal.     Palpations: Abdomen is soft.     Tenderness: There is no abdominal tenderness.  Genitourinary:    Rectum: Normal. Guaiac result negative.     Comments: No visible stool present in the rectal vault. Musculoskeletal:        General: Normal range of motion.     Cervical back: Normal  range of motion and neck supple.  Skin:    General: Skin is warm and dry.  Neurological:     General: No focal deficit present.     Mental Status: She is alert.  Psychiatric:        Mood and Affect: Mood normal.        Behavior: Behavior normal.    ED Results / Procedures / Treatments   Labs (all labs ordered are listed, but only abnormal results are displayed) Labs Reviewed  COMPREHENSIVE METABOLIC PANEL - Abnormal; Notable for the following components:      Result Value   Glucose, Bld 115 (*)    BUN 46 (*)    Creatinine, Ser 9.61 (*)    Albumin 3.3 (*)    GFR, Estimated 5 (*)    All other components within normal limits  CBC WITH DIFFERENTIAL/PLATELET - Abnormal; Notable for the following components:   WBC 14.4 (*)    RBC 2.13 (*)    Hemoglobin 5.8 (*)    HCT 18.2 (*)    Platelets 775 (*)    Neutro Abs 11.1 (*)    All other components within normal limits  CBG MONITORING, ED - Abnormal; Notable for the following components:   Glucose-Capillary 137 (*)    All other components within normal limits  LIPASE, BLOOD  URINALYSIS, ROUTINE W REFLEX MICROSCOPIC  I-STAT BETA HCG BLOOD, ED ([facility], [facility], [facility]  ONLY)  POC OCCULT BLOOD, ED  PREPARE RBC (CROSSMATCH)  TYPE AND SCREEN    EKG None  Radiology No results found.  Procedures Procedures   Medications Ordered in ED Medications - No data to display  ED Course  I have reviewed the triage vital signs and the nursing notes.  Pertinent labs & imaging results that were available during my care of the patient were reviewed by me and considered in my medical decision making (see chart for details).    MDM Rules/Calculators/A&P                         Patient presents today with 2 days of nausea and vomiting. No diarrhea, fevers, or chills. Hemoglobin found to be 5.8 down from 10 thirteen days ago. No clear source of bleeding, vomit is non-bloody and hemoccult negative. Patient denies pain. She is currently hemodynamically  stable, will transfuse RBCs and call for admission. Patient is amenable with plan.  Discussed patient with hospitalist Dr. Myna Hidalgo who suspects lab error, would like recheck before admission.  H&H ordered, pending at shift change.  Will await results of this to determine dispo.  Patient with continued emesis at shift change also, will reevaluate this pending hemoglobin recheck.  Care handoff to Ferne Reus, PA-C at shift change.  See their note for dispo.  Findings and plan of care discussed with supervising physician Dr. Rogene Houston who is in agreement.    Final Clinical Impression(s) / ED Diagnoses Final diagnoses:  Nausea and vomiting, unspecified vomiting type    Rx / DC Orders ED Discharge Orders     None        Nestor Lewandowsky 11/25/20 0142    Fredia Sorrow, MD 12/05/20 [phone]

## 2020-11-24 NOTE — ED Triage Notes (Signed)
Pt arrives via [facility] from home for N/V x24 hours. Pt does peritoneal dialysis nightly and was unable to do so tonight due to continuous vomiting. Pt hasn't tolerated anything PO, has not taken any meds today. BP 220/110, HR 120s, 96% RA, CBG 135.

## 2020-11-24 NOTE — ED Notes (Signed)
RN unable to obtain IV access, Matt RN unable to obtain access.

## 2020-11-25 LAB — URINALYSIS, ROUTINE W REFLEX MICROSCOPIC
Bacteria, UA: NONE SEEN
Bilirubin Urine: NEGATIVE
Glucose, UA: >=500 mg/dL — AB
Hgb urine dipstick: NEGATIVE
Ketones, ur: 5 mg/dL — AB
Leukocytes,Ua: NEGATIVE
Nitrite: NEGATIVE
Protein, ur: 100 mg/dL — AB
Specific Gravity, Urine: 1.012 (ref 1.005–1.030)
pH: 7 (ref 5.0–8.0)

## 2020-11-25 LAB — HEMOGLOBIN AND HEMATOCRIT, BLOOD
HCT: 31.6 % — ABNORMAL LOW (ref 36.0–46.0)
Hemoglobin: 10.3 g/dL — ABNORMAL LOW (ref 12.0–15.0)

## 2020-11-25 LAB — PREPARE RBC (CROSSMATCH)

## 2020-11-25 LAB — CBG MONITORING, ED: Glucose-Capillary: 161 mg/dL — ABNORMAL HIGH (ref 70–99)

## 2020-11-25 MED ORDER — SODIUM CHLORIDE 0.9 % IV BOLUS
1000.0000 mL | Freq: Once | INTRAVENOUS | Status: AC
  Administered 2020-11-25: 1000 mL via INTRAVENOUS

## 2020-11-25 MED ORDER — METOCLOPRAMIDE HCL 5 MG/ML IJ SOLN
10.0000 mg | Freq: Once | INTRAMUSCULAR | Status: AC
  Administered 2020-11-25: 10 mg via INTRAVENOUS
  Filled 2020-11-25: qty 2

## 2020-11-25 MED ORDER — PROMETHAZINE HCL 25 MG RE SUPP: 25.0000 mg | Freq: Four times a day (QID) | RECTAL | 0 refills | Status: DC | PRN

## 2020-11-25 MED ORDER — SODIUM CHLORIDE 0.9 % IV BOLUS: 1000.0000 mL | Freq: Once | INTRAVENOUS | Status: DC

## 2020-11-25 NOTE — Discharge Instructions (Addendum)
Continue your Reglan at home. A prescription for Phenergan as been called in as a back up if you are vomiting and cannot tolerate by-mouth medications.   It is important to continue to drink plenty of fluids.   Return to the ED with any uncontrolled vomiting, if you develop a fever or have any new or concerning symptoms. Otherwise, follow up with your doctor this week for recheck.

## 2020-11-25 NOTE — ED Notes (Signed)
Pt verbalized understanding of discharge instructions; opportunity for questions provided °

## 2020-11-25 NOTE — ED Provider Notes (Signed)
T1DM, Peritoneal dialysis Nausea and vomiting for several days No other symptoms.  No fever Compliant with treatments  Hgb, resulted as 5, being repeated as she is hemodynamically very stable, no pallor.   If anemic, admit If vomiting intractable, admit  Repeat Hgb normal at 10.3 On recheck, she is still nauseous and vomiting. IV bolus ordered, IV Reglan which is what she uses at home at reports it is more effective than Zofran.   4:40 - patient tolerating PO fluids - doing well. On this recheck, however, she has rigors, tachy to 116. Will recheck temp and give another liter of fluid. She has not produced any urine at this point, will continue.   5:40 - tachycardia improving. UA negative for infection. Minimal keytones. No anion gap or acidosis to cause concern for DKA. She has had no further vomiting.   She can be discharged home. Will provide Phenergan suppositories as a back up treatment. Patient encouraged to return with any uncontrolled vomiting or new concern.    Charlann Lange, PA-C 11/25/20 0550    Fredia Sorrow, MD 12/05/20 [phone]

## 2020-11-27 LAB — TYPE AND SCREEN
ABO/RH(D): A POS
Antibody Screen: NEGATIVE
Unit division: 0
Unit division: 0

## 2020-11-27 LAB — BPAM RBC
Blood Product Expiration Date: 202211202359
Blood Product Expiration Date: 202211212359
ISSUE DATE / TIME: 202211071555
ISSUE DATE / TIME: 202211071555
Unit Type and Rh: 6200
Unit Type and Rh: 6200

## 2020-11-28 ENCOUNTER — Other Ambulatory Visit: Payer: Self-pay

## 2020-11-28 ENCOUNTER — Ambulatory Visit (INDEPENDENT_AMBULATORY_CARE_PROVIDER_SITE_OTHER): Payer: 59 | Admitting: Endocrinology

## 2020-11-28 VITALS — BP 162/90 | HR 74 | Ht 66.0 in | Wt 147.6 lb

## 2020-11-28 DIAGNOSIS — E10319 Type 1 diabetes mellitus with unspecified diabetic retinopathy without macular edema: Secondary | ICD-10-CM | POA: Diagnosis not present

## 2020-11-28 LAB — POCT GLYCOSYLATED HEMOGLOBIN (HGB A1C): Hemoglobin A1C: 8.2 % — AB (ref 4.0–5.6)

## 2020-11-28 MED ORDER — INSULIN LISPRO (1 UNIT DIAL) 100 UNIT/ML (KWIKPEN): 3.0000 [IU] | PEN_INJECTOR | Freq: Three times a day (TID) | SUBCUTANEOUS | 3 refills | Status: DC

## 2020-11-28 MED ORDER — ONETOUCH VERIO VI STRP: 1.0000 | ORAL_STRIP | Freq: Three times a day (TID) | 3 refills | Status: DC

## 2020-11-28 NOTE — Progress Notes (Signed)
Subjective:    Patient ID: Barbara Donovan, female    DOB: [birth year], 30 y.o.   MRN: [id number]  HPI Pt returns for f/u of diabetes mellitus:  DM type: 1 Dx'ed: 0867 Complications: PDR, GP, and ESRD (on home PD) Therapy: insulin since 2009, and pump rx since 2020.  DKA: last episode was 2022.  Severe hypoglycemia: once (2022) Pancreatitis: never Pancreatic imaging: never.  Other: she took pump rx 2020-2122; goal is to simplify insulin schedule by emphasizing basal.  Interval history:  She eats meals at 8AM, 12N, and 6PM.  Pt says the Novolog is 3 units 3 times a day (just before each meal).   She was again in the hospital again, with DKA.  pt states she feels better in general now.  She is overdue for ov, and she lost her meter.  She declines continuous glucose monitor.   Past Medical History:  Diagnosis Date   Asthma    as a child, no problems as an adult, no inhaler   Cataract    NS OU   Chronic hypertension during pregnancy, antepartum 08/19/2017   Dehydration 01/28/2018   Depression during pregnancy, antepartum 07/07/2017   6/20: Short trial of zoloft previously, reports didn't help much but also didn't give it a chance Discussed r/b/a SSRIs in pregnancy, agrees to try Zoloft again, rx sent No SI/HI/red flags   Diabetes ([place])    TYPE I. 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 A2/B GDM)  Required Referrals for A1GDM or A2GDM: [x]  Diabetes Education and Testing Supplies [x]  Nutrition Cousult  For A2/B GDM or higher classes of DM [x]  Diabetes Education and Testing Supplies [x]  Nutrition Counsult [x]  Fetal ECHO after 20 weeks  [x]  Eye exam for retina evaluation - severe retinopathy 7/19  Base   Ventricular septal defect (VSD) of fetus in singleton pregnancy, antepartum 09/30/2017   May go to newborn nursery per Dr. Lenard Simmer Echo prior to discharge    Past Surgical History:  Procedure Laterality Date   25 GAUGE PARS PLANA VITRECTOMY WITH 20 GAUGE MVR PORT FOR MACULAR HOLE Left 07/20/2018   Procedure: 25 GAUGE PARS PLANA VITRECTOMY LEFT EYE;  Surgeon: Bernarda Caffey, MD;  Location: [place];  Service: Ophthalmology;  Laterality: Left;   CAPD INSERTION N/A 09/10/2020   Procedure: LAPAROSCOPIC INSERTION CONTINUOUS AMBULATORY PERITONEAL DIALYSIS  (CAPD) CATHETER;  Surgeon: Serafina Mitchell, MD;  Location: [place];  Service: Vascular;  Laterality: N/A;   CESAREAN SECTION N/A 12/26/2017   Procedure: CESAREAN SECTION;  Surgeon: Osborne Oman, MD;  Location: [place];  Service: Obstetrics;  Laterality: N/A;   EYE EXAMINATION UNDER ANESTHESIA Right 07/20/2018   Procedure: Eye Exam Under Anesthesia RIGHT EYE;  Surgeon: Bernarda Caffey, MD;  Location: [place];  Service: Ophthalmology;  Laterality: Right;   EYE SURGERY Left 07/2018   GAS INSERTION Left 07/19/2019   Procedure: INSERTION OF GAS;  Surgeon: Bernarda Caffey, MD;  Location: [place];  Service: Ophthalmology;  Laterality: Left;  SF6   INJECTION OF SILICONE OIL Left 04/21/3152   Procedure: Injection Of Silicone Oil LEFT EYE;  Surgeon: Bernarda Caffey, MD;  Location: [place];  Service: Ophthalmology;  Laterality: Left;   IR FLUORO GUIDE CV LINE RIGHT  06/02/2020   IR US GUIDE VASC ACCESS RIGHT  06/02/2020    LASER PHOTO ABLATION Right 07/20/2018   Procedure: Laser Photo Ablation RIGHT EYE;  Surgeon: Bernarda Caffey, MD;  Location: [place];  Service: Ophthalmology;  Laterality: Right;   MEMBRANE PEEL Left 07/20/2018   Procedure: Membrane Peel LEFT EYE;  Surgeon: Bernarda Caffey, MD;  Location: [place];  Service: Ophthalmology;  Laterality: Left;   MEMBRANE PEEL Left 07/19/2019   Procedure: MEMBRANE PEEL;  Surgeon: Bernarda Caffey, MD;  Location: [place];  Service: Ophthalmology;  Laterality: Left;   MITOMYCIN C APPLICATION Bilateral 0/0/8676   Procedure: Avastin Application;  Surgeon: Bernarda Caffey, MD;  Location: [place];  Service: Ophthalmology;  Laterality: Bilateral;   PHOTOCOAGULATION WITH LASER Left 07/20/2018   Procedure: Photocoagulation With Laser LEFT EYE;  Surgeon: Bernarda Caffey, MD;  Location: [place];  Service: Ophthalmology;  Laterality: Left;   PHOTOCOAGULATION WITH LASER Left 07/19/2019   Procedure: PHOTOCOAGULATION WITH LASER;  Surgeon: Bernarda Caffey, MD;  Location: [place];  Service: Ophthalmology;  Laterality: Left;   RETINAL DETACHMENT SURGERY Left 07/20/2018   Dr. Bernarda Caffey   SILICON OIL REMOVAL Left 07/19/2019   Procedure: 25g PARS PLANA VITRECTOMY WITH SILICON OIL REMOVAL;  Surgeon: Bernarda Caffey, MD;  Location: [place];  Service: Ophthalmology;  Laterality: Left;   WISDOM TOOTH EXTRACTION      Social History   Socioeconomic History   Marital status: Significant Other    Spouse name: Not on file   Number of children: Not on file   Years of education: Not on file   Highest education level: Not on file  Occupational History   Not on file  Tobacco Use   Smoking status: Never   Smokeless tobacco: Never  Vaping Use   Vaping Use: Never used  Substance and Sexual Activity   Alcohol use: Not Currently   Drug use: Never   Sexual activity: Not Currently    Birth control/protection: None  Other Topics Concern   Not on file  Social History Narrative   Not on file   Social Determinants of Health    Financial Resource Strain: Not on file  Food Insecurity: Not on file  Transportation Needs: Not on file  Physical Activity: Not on file  Stress: Not on file  Social Connections: Not on file  Intimate Partner Violence: Not on file    Current Outpatient Medications on File Prior to Visit  Medication Sig Dispense Refill   acetaminophen (TYLENOL) 500 MG tablet Take 1,000 mg by mouth every 6 (six) hours as needed for mild pain.     amLODipine (NORVASC) 10 MG tablet Take 1 tablet (10 mg total) by mouth daily. 90 tablet 1   carvedilol (COREG) 25 MG tablet Take 1 tablet (25 mg total) by mouth 2 (two) times daily. 180 tablet 3   Continuous Blood Gluc Sensor MISC 1 each by Does not apply route as directed. Use as directed every 14 days. May  dispense FreeStyle [employer] or similar. 1 each 0   gentamicin cream (GARAMYCIN) 0.1 % Apply 1 application topically daily.     hydrALAZINE (APRESOLINE) 50 MG tablet Take 2 tablets (100 mg total) by mouth 3 (three) times daily. (Patient taking differently: Take 50 mg by mouth 3 (three) times daily.) 180 tablet 0   insulin glargine (LANTUS SOLOSTAR) 100 UNIT/ML Solostar Pen Inject 20 Units into the skin every morning. 15 mL 0   losartan (COZAAR) 50 MG tablet Take 2 tablets (100 mg total) by mouth daily. 60 tablet 0   metoCLOPramide (REGLAN) 5 MG tablet Take 1 tablet (5 mg total) by mouth 4 (four) times daily -  before meals and at bedtime. 120 tablet 3   multivitamin (RENA-VIT) TABS tablet Take 1 tablet by mouth at bedtime. 90 tablet 1   ondansetron (ZOFRAN ODT) 4 MG disintegrating tablet Take 1 tablet (4 mg total) by mouth every 8 (eight) hours as needed for nausea or vomiting. 20 tablet 0   promethazine (PHENERGAN) 25 MG suppository Place 1 suppository (25 mg total) rectally every 6 (six) hours as needed for nausea or vomiting. 12 each 0   No current facility-administered medications on file prior to visit.    No Known Allergies  Family History   Problem Relation Age of Onset   Diabetes Mother    Aneurysm Mother 9   Seizures Mother    Diabetes Father    Cataracts Father    COPD Father    Heart attack Father    Heart disease Father    Healthy Sister    Healthy Daughter    Stroke Maternal Grandfather    Amblyopia Neg Hx    Blindness Neg Hx    Glaucoma Neg Hx    Macular degeneration Neg Hx    Retinal detachment Neg Hx    Strabismus Neg Hx    Retinitis pigmentosa Neg Hx    Colon cancer Neg Hx    Stomach cancer Neg Hx    Esophageal cancer Neg Hx    Pancreatic cancer Neg Hx    Liver disease Neg Hx     BP (!) 162/90 (BP Location: Right Arm, Patient Position: Sitting, Cuff Size: Normal)   Pulse 74   Ht 5\' 6"  (1.676 m)   Wt 147 lb 9.6 oz (67 kg)   SpO2 98%   BMI 23.82 kg/m   Review of Systems She denies hypoglycemia    Objective:   Physical Exam   Lab Results  Component Value Date   HGBA1C 8.8 (H) 11/10/2020      Assessment & Plan:  Type 1 DM: uncontrolled.    Patient Instructions  Please continue the same insulins for now.   check your blood sugar 3 times a day: before the 3 meals, and at bedtime.  also check if you have symptoms of your blood sugar being too high or too low.  please keep a record of the readings and bring it to your next appointment here (or you can bring the meter itself).  You can write it on any piece of paper.  please call us sooner if your blood sugar goes below 70, or if you have a lot of readings over 200.   Here is a new meter.  I have sent a prescription to your pharmacy, for strips.   Please come back for a follow-up appointment in 1 month.

## 2020-11-28 NOTE — Patient Instructions (Addendum)
Please continue the same insulins for now.   check your blood sugar 3 times a day: before the 3 meals, and at bedtime.  also check if you have symptoms of your blood sugar being too high or too low.  please keep a record of the readings and bring it to your next appointment here (or you can bring the meter itself).  You can write it on any piece of paper.  please call us sooner if your blood sugar goes below 70, or if you have a lot of readings over 200.   Here is a new meter.  I have sent a prescription to your pharmacy, for strips.   Please come back for a follow-up appointment in 1 month.

## 2020-12-15 ENCOUNTER — Other Ambulatory Visit (HOSPITAL_COMMUNITY): Payer: Self-pay

## 2020-12-25 NOTE — Progress Notes (Shared)
[facility] Note  12/31/2020     CHIEF COMPLAINT Patient presents for No chief complaint on file.   HISTORY OF PRESENT ILLNESS: Barbara Donovan is a 30 y.o. female who presents to the clinic today for:     Referring physician: Fanny Bien, MD [address] [place],  [place] [address]  HISTORICAL INFORMATION:   Selected notes from the MEDICAL RECORD NUMBER Referred by Dr. [name] for concern of bilateral CRVO   CURRENT MEDICATIONS: No current outpatient medications on file. (Ophthalmic Drugs)   No current facility-administered medications for this visit. (Ophthalmic Drugs)   Current Outpatient Medications (Other)  Medication Sig   acetaminophen (TYLENOL) 500 MG tablet Take 1,000 mg by mouth every 6 (six) hours as needed for mild pain.   amLODipine (NORVASC) 10 MG tablet Take 1 tablet (10 mg total) by mouth daily.   carvedilol (COREG) 25 MG tablet Take 1 tablet (25 mg total) by mouth 2 (two) times daily.   Continuous Blood Gluc Sensor MISC 1 each by Does not apply route as directed. Use as directed every 14 days. May dispense FreeStyle [employer] or similar.   gentamicin cream (GARAMYCIN) 0.1 % Apply 1 application topically daily.   glucose blood (ONETOUCH VERIO) test strip 1 each by Other route 3 (three) times daily. And lancets 3/day   hydrALAZINE (APRESOLINE) 50 MG tablet Take 2 tablets (100 mg total) by mouth 3 (three) times daily. (Patient taking differently: Take 50 mg by mouth 3 (three) times daily.)   insulin glargine (LANTUS SOLOSTAR) 100 UNIT/ML Solostar Pen Inject 20 Units into the skin every morning.   insulin lispro (HUMALOG KWIKPEN) 100 UNIT/ML KwikPen Inject 3 Units into the skin 3 (three) times daily with meals.   losartan (COZAAR) 50 MG tablet Take 2 tablets (100 mg total) by mouth daily.   metoCLOPramide (REGLAN) 5 MG tablet Take 1 tablet (5 mg total) by mouth 4 (four) times daily -  before meals and at bedtime.    multivitamin (RENA-VIT) TABS tablet Take 1 tablet by mouth at bedtime.   ondansetron (ZOFRAN ODT) 4 MG disintegrating tablet Take 1 tablet (4 mg total) by mouth every 8 (eight) hours as needed for nausea or vomiting.   promethazine (PHENERGAN) 25 MG suppository Place 1 suppository (25 mg total) rectally every 6 (six) hours as needed for nausea or vomiting.   No current facility-administered medications for this visit. (Other)      REVIEW OF SYSTEMS:     ALLERGIES No Known Allergies  PAST MEDICAL HISTORY Past Medical History:  Diagnosis Date   Asthma    as a child, no problems as an adult, no inhaler   Cataract    NS OU   Chronic hypertension during pregnancy, antepartum 08/19/2017   Dehydration 01/28/2018   Depression during pregnancy, antepartum 07/07/2017   6/20: Short trial of zoloft previously, reports didn't help much but also didn't give it a chance Discussed r/b/a SSRIs in pregnancy, agrees to try Zoloft again, rx sent No SI/HI/red flags   Diabetes ([place])    TYPE I. A1C 7.5% 05/31/20   Diabetic retinopathy ([place]) 06/09/2017   07/2017 with bilateral severe diabetic non-proliferative retinopathy with macular edema.   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 A2/B GDM)  Required Referrals for A1GDM or A2GDM: [x]  Diabetes Education and Testing Supplies [x]  Nutrition Cousult  For A2/B GDM or higher  classes of DM [x]  Diabetes Education and Testing Supplies [x]  Nutrition Counsult [x]  Fetal ECHO after 20 weeks  [x]  Eye exam for retina evaluation - severe retinopathy 7/19  Base   Ventricular septal defect (VSD) of fetus in singleton pregnancy, antepartum 09/30/2017   May go to newborn nursery per Dr. Lenard Simmer Echo prior to discharge   Past Surgical History:  Procedure Laterality Date   25 GAUGE PARS PLANA VITRECTOMY WITH 20 GAUGE MVR PORT FOR MACULAR HOLE Left 07/20/2018   Procedure: 25 GAUGE PARS PLANA VITRECTOMY LEFT EYE;  Surgeon: Bernarda Caffey, MD;  Location: [facility];  Service: Ophthalmology;  Laterality: Left;   CAPD INSERTION N/A 09/10/2020   Procedure: LAPAROSCOPIC INSERTION CONTINUOUS AMBULATORY PERITONEAL DIALYSIS  (CAPD) CATHETER;  Surgeon: Serafina Mitchell, MD;  Location: [facility];  Service: Vascular;  Laterality: N/A;   CESAREAN SECTION N/A 12/26/2017   Procedure: CESAREAN SECTION;  Surgeon: Osborne Oman, MD;  Location: [facility];  Service: Obstetrics;  Laterality: N/A;   EYE EXAMINATION UNDER ANESTHESIA Right 07/20/2018   Procedure: Eye Exam Under Anesthesia RIGHT EYE;  Surgeon: Bernarda Caffey, MD;  Location: [place] [facility] [facility];  Service: Ophthalmology;  Laterality: Right;   EYE SURGERY Left 07/2018   GAS INSERTION Left 07/19/2019   Procedure: INSERTION OF GAS;  Surgeon: Bernarda Caffey, MD;  Location: [facility];  Service: Ophthalmology;  Laterality: Left;  SF6   INJECTION OF SILICONE OIL Left 07/23/1948   Procedure: Injection Of Silicone Oil LEFT EYE;  Surgeon: Bernarda Caffey, MD;  Location: [facility];  Service: Ophthalmology;  Laterality: Left;   IR FLUORO GUIDE CV LINE RIGHT  06/02/2020   IR US GUIDE VASC ACCESS RIGHT  06/02/2020   LASER PHOTO ABLATION Right 07/20/2018   Procedure: Laser Photo Ablation RIGHT EYE;  Surgeon: Bernarda Caffey, MD;  Location: [facility];  Service: Ophthalmology;  Laterality: Right;   MEMBRANE PEEL Left 07/20/2018   Procedure: Membrane Peel LEFT EYE;  Surgeon: Bernarda Caffey, MD;  Location:  [facility];  Service: Ophthalmology;  Laterality: Left;   MEMBRANE PEEL Left 07/19/2019   Procedure: MEMBRANE PEEL;  Surgeon: Bernarda Caffey, MD;  Location: [facility];  Service: Ophthalmology;  Laterality: Left;   MITOMYCIN C APPLICATION Bilateral 09/20/2669   Procedure: Avastin Application;  Surgeon: Bernarda Caffey, MD;  Location: [facility];  Service: Ophthalmology;  Laterality: Bilateral;   PHOTOCOAGULATION WITH LASER Left 07/20/2018   Procedure: Photocoagulation With Laser LEFT EYE;  Surgeon: Bernarda Caffey, MD;  Location: [facility];  Service: Ophthalmology;  Laterality: Left;   PHOTOCOAGULATION WITH LASER Left 07/19/2019   Procedure: PHOTOCOAGULATION WITH LASER;  Surgeon: Bernarda Caffey, MD;  Location: [facility];  Service: Ophthalmology;  Laterality: Left;   RETINAL DETACHMENT SURGERY Left 07/20/2018   Dr. Bernarda Caffey   SILICON OIL REMOVAL Left 07/19/2019   Procedure: 25g PARS PLANA VITRECTOMY WITH SILICON OIL REMOVAL;  Surgeon: Bernarda Caffey, MD;  Location: [facility];  Service: Ophthalmology;  Laterality: Left;   WISDOM TOOTH EXTRACTION      FAMILY HISTORY Family History  Problem Relation Age of Onset   Diabetes Mother    Aneurysm Mother 70   Seizures Mother    Diabetes Father    Cataracts Father    COPD Father    Heart attack Father    Heart disease Father    Healthy Sister    Healthy Daughter    Stroke Maternal Grandfather    Amblyopia Neg Hx    Blindness Neg Hx    Glaucoma Neg Hx    Macular degeneration Neg Hx    Retinal detachment Neg Hx    Strabismus Neg Hx    Retinitis pigmentosa Neg Hx    Colon cancer Neg Hx    Stomach cancer Neg Hx    Esophageal cancer Neg Hx    Pancreatic cancer Neg Hx    Liver disease Neg Hx     SOCIAL HISTORY Social History   Tobacco Use   Smoking status: Never   Smokeless tobacco: Never  Vaping Use   Vaping Use: Never used  Substance Use Topics   Alcohol use: Not Currently   Drug use: Never         OPHTHALMIC EXAM:  Not recorded     IMAGING AND PROCEDURES   Imaging and Procedures for @TODAY @            ASSESSMENT/PLAN:  No diagnosis found.   1-3. 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 Eulas Post, 12.15.20 -- looks good, +PCO  - s/p PPV/SOR removal/Tissue Blue stain/MP/20% SF6 gas, IVA OS, 07.01.21             - IOP 37 (OS) -- pt only using Brim and Cosopt BID OS             - increase Brimonidine and Cosopt back up to TID OS  - restart latanoprost QHS OS  - f/u 1-2 weeks, IOP check and DFE, OCT  OD  - today, VH significantly improved (amazing response to IVA OD #3, 9.27.21)  - s/p fill in PRP and IVA OD 07.02.2020 in operating room  - diffuse VH OD ~11.26.20   - s/p IVA OD #2 (11.30.20), #3 (09.24.21)  - BCVA improved to 20/20 from 20/25  4,5. Epiretinal membrane, left eye -- improved  - focal ERM with retinal thickening nasal macula   - s/p TissueBlue assisted membrane peel OS as above (7.1.21)  - OCT today shows improved nasal thickening post-ERM peel   6,7. Hypertensive retinopathy OU  - there was concern for pregnancy related hypertension (I.e. Pre-eclampsia, Eclampsia, HELLP, etc) during pregnancy  - also during pregnancy (7.17.2019), had bilateral CRVO w/ CME (OD > OS) s/p PRP OS  - discussed importance of tight BP  control  - monitor  8. Mixed cataract OD  - The symptoms of cataract, surgical options, and treatments and risks were discussed with patient.  - not  visually significant    9. Pseudophakia OS  - s/p CE/IOL OS w/ Dr. Eulas Post, 12.15.20  - beautiful surgery  - +PCO  - will refer back to Dr. Eulas Post for YAG cap OS    Ophthalmic Meds Ordered this visit:  No orders of the defined types were placed in this encounter.     This document serves as a record of services personally performed by Gardiner Sleeper, MD, PhD. It was created on their behalf by Orvan Falconer, an ophthalmic technician. The creation of this record is the provider's dictation and/or activities during the visit.    Electronically signed by: Orvan Falconer, OA, 12/25/20  1:10 PM   Gardiner Sleeper, M.D., Ph.D. Diseases & Surgery of the Retina and [place] 02/26/2020   I have reviewed the above documentation for accuracy and completeness, and I agree with the above. Gardiner Sleeper, M.D., Ph.D. 02/26/20 1:10 PM   Abbreviations: M myopia (nearsighted); A astigmatism; H hyperopia (farsighted); P presbyopia; Mrx spectacle prescription;  CTL contact lenses; OD right eye; OS left eye; OU both eyes  XT exotropia; ET esotropia; PEK punctate epithelial keratitis; PEE punctate epithelial erosions; DES dry eye syndrome; MGD meibomian gland dysfunction; ATs artificial tears; PFAT's preservative free artificial tears; [place] nuclear sclerotic cataract; PSC posterior subcapsular cataract; ERM epi-retinal membrane; PVD posterior vitreous detachment; RD retinal detachment; DM diabetes mellitus; DR diabetic retinopathy; NPDR non-proliferative diabetic retinopathy; PDR proliferative diabetic retinopathy; CSME clinically significant macular edema; DME diabetic macular edema; dbh dot blot hemorrhages; CWS cotton wool spot; POAG primary open angle glaucoma; C/D cup-to-disc ratio; HVF humphrey visual field; GVF goldmann visual field; OCT optical coherence tomography; IOP intraocular pressure; BRVO Branch retinal vein occlusion; CRVO central retinal vein occlusion;  CRAO central retinal artery occlusion; BRAO branch retinal artery occlusion; RT retinal tear; SB scleral buckle; PPV pars plana vitrectomy; VH Vitreous hemorrhage; PRP panretinal laser photocoagulation; IVK intravitreal kenalog; VMT vitreomacular traction; MH Macular hole;  NVD neovascularization of the disc; NVE neovascularization elsewhere; AREDS age related eye disease study; ARMD age related macular degeneration; POAG primary open angle glaucoma; EBMD epithelial/anterior basement membrane dystrophy; ACIOL anterior chamber intraocular lens; IOL intraocular lens; PCIOL posterior chamber intraocular lens; Phaco/IOL phacoemulsification with intraocular lens placement; [place] photorefractive keratectomy; LASIK laser assisted in situ keratomileusis; HTN hypertension; DM diabetes mellitus; COPD chronic obstructive pulmonary disease

## 2020-12-27 ENCOUNTER — Emergency Department (HOSPITAL_COMMUNITY)
Admission: EM | Admit: 2020-12-27 | Discharge: 2020-12-28 | Disposition: A | Discharge status: Home / Self Care | Payer: 59 | Attending: Emergency Medicine | Admitting: Emergency Medicine

## 2020-12-27 ENCOUNTER — Other Ambulatory Visit: Payer: Self-pay

## 2020-12-27 ENCOUNTER — Encounter (HOSPITAL_COMMUNITY): Payer: Self-pay | Admitting: Emergency Medicine

## 2020-12-27 DIAGNOSIS — R739 Hyperglycemia, unspecified: Secondary | ICD-10-CM

## 2020-12-27 DIAGNOSIS — Z20822 Contact with and (suspected) exposure to covid-19: Secondary | ICD-10-CM | POA: Insufficient documentation

## 2020-12-27 DIAGNOSIS — Z79899 Other long term (current) drug therapy: Secondary | ICD-10-CM | POA: Insufficient documentation

## 2020-12-27 DIAGNOSIS — Z992 Dependence on renal dialysis: Secondary | ICD-10-CM | POA: Insufficient documentation

## 2020-12-27 DIAGNOSIS — E1022 Type 1 diabetes mellitus with diabetic chronic kidney disease: Secondary | ICD-10-CM | POA: Insufficient documentation

## 2020-12-27 DIAGNOSIS — R000 Tachycardia, unspecified: Secondary | ICD-10-CM | POA: Insufficient documentation

## 2020-12-27 DIAGNOSIS — R1084 Generalized abdominal pain: Secondary | ICD-10-CM | POA: Insufficient documentation

## 2020-12-27 DIAGNOSIS — R112 Nausea with vomiting, unspecified: Secondary | ICD-10-CM

## 2020-12-27 DIAGNOSIS — N9489 Other specified conditions associated with female genital organs and menstrual cycle: Secondary | ICD-10-CM | POA: Insufficient documentation

## 2020-12-27 DIAGNOSIS — N186 End stage renal disease: Secondary | ICD-10-CM | POA: Insufficient documentation

## 2020-12-27 DIAGNOSIS — J45909 Unspecified asthma, uncomplicated: Secondary | ICD-10-CM | POA: Insufficient documentation

## 2020-12-27 DIAGNOSIS — E1065 Type 1 diabetes mellitus with hyperglycemia: Secondary | ICD-10-CM | POA: Insufficient documentation

## 2020-12-27 DIAGNOSIS — I120 Hypertensive chronic kidney disease with stage 5 chronic kidney disease or end stage renal disease: Secondary | ICD-10-CM | POA: Insufficient documentation

## 2020-12-27 DIAGNOSIS — Z8616 Personal history of COVID-19: Secondary | ICD-10-CM | POA: Insufficient documentation

## 2020-12-27 DIAGNOSIS — Z794 Long term (current) use of insulin: Secondary | ICD-10-CM | POA: Insufficient documentation

## 2020-12-27 HISTORY — DX: Dependence on renal dialysis: Z99.2

## 2020-12-27 LAB — CBG MONITORING, ED
Glucose-Capillary: 187 mg/dL — ABNORMAL HIGH (ref 70–99)
Glucose-Capillary: 226 mg/dL — ABNORMAL HIGH (ref 70–99)

## 2020-12-27 LAB — I-STAT VENOUS BLOOD GAS, ED
Acid-Base Excess: 0 mmol/L (ref 0.0–2.0)
Bicarbonate: 21.5 mmol/L (ref 20.0–28.0)
Calcium, Ion: 1.02 mmol/L — ABNORMAL LOW (ref 1.15–1.40)
HCT: 39 % (ref 36.0–46.0)
Hemoglobin: 13.3 g/dL (ref 12.0–15.0)
O2 Saturation: 100 %
Potassium: 4.4 mmol/L (ref 3.5–5.1)
Sodium: 140 mmol/L (ref 135–145)
TCO2: 22 mmol/L (ref 22–32)
pCO2, Ven: 27.6 mmHg — ABNORMAL LOW (ref 44.0–60.0)
pH, Ven: 7.501 — ABNORMAL HIGH (ref 7.250–7.430)
pO2, Ven: 149 mmHg — ABNORMAL HIGH (ref 32.0–45.0)

## 2020-12-27 LAB — BASIC METABOLIC PANEL
Anion gap: 20 — ABNORMAL HIGH (ref 5–15)
BUN: 54 mg/dL — ABNORMAL HIGH (ref 6–20)
CO2: 18 mmol/L — ABNORMAL LOW (ref 22–32)
Calcium: 9.2 mg/dL (ref 8.9–10.3)
Chloride: 101 mmol/L (ref 98–111)
Creatinine, Ser: 11.46 mg/dL — ABNORMAL HIGH (ref 0.44–1.00)
GFR, Estimated: 4 mL/min — ABNORMAL LOW (ref >60)
Glucose, Bld: 275 mg/dL — ABNORMAL HIGH (ref 70–99)
Potassium: 3.9 mmol/L (ref 3.5–5.1)
Sodium: 139 mmol/L (ref 135–145)

## 2020-12-27 LAB — LIPASE, BLOOD: Lipase: 45 U/L (ref 11–51)

## 2020-12-27 LAB — COMPREHENSIVE METABOLIC PANEL
ALT: 12 U/L (ref 0–44)
AST: 16 U/L (ref 15–41)
Albumin: 4 g/dL (ref 3.5–5.0)
Alkaline Phosphatase: 85 U/L (ref 38–126)
Anion gap: 22 — ABNORMAL HIGH (ref 5–15)
BUN: 50 mg/dL — ABNORMAL HIGH (ref 6–20)
CO2: 18 mmol/L — ABNORMAL LOW (ref 22–32)
Calcium: 10.1 mg/dL (ref 8.9–10.3)
Chloride: 102 mmol/L (ref 98–111)
Creatinine, Ser: 11.07 mg/dL — ABNORMAL HIGH (ref 0.44–1.00)
GFR, Estimated: 4 mL/min — ABNORMAL LOW (ref >60)
Glucose, Bld: 206 mg/dL — ABNORMAL HIGH (ref 70–99)
Potassium: 4.1 mmol/L (ref 3.5–5.1)
Sodium: 142 mmol/L (ref 135–145)
Total Bilirubin: 0.8 mg/dL (ref 0.3–1.2)
Total Protein: 8.3 g/dL — ABNORMAL HIGH (ref 6.5–8.1)

## 2020-12-27 LAB — CBC WITH DIFFERENTIAL/PLATELET
Abs Immature Granulocytes: 0.04 10*3/uL (ref 0.00–0.07)
Basophils Absolute: 0 10*3/uL (ref 0.0–0.1)
Basophils Relative: 0 %
Eosinophils Absolute: 0 10*3/uL (ref 0.0–0.5)
Eosinophils Relative: 0 %
HCT: 38.2 % (ref 36.0–46.0)
Hemoglobin: 12.3 g/dL (ref 12.0–15.0)
Immature Granulocytes: 0 %
Lymphocytes Relative: 10 %
Lymphs Abs: 1.1 10*3/uL (ref 0.7–4.0)
MCH: 28.5 pg (ref 26.0–34.0)
MCHC: 32.2 g/dL (ref 30.0–36.0)
MCV: 88.6 fL (ref 80.0–100.0)
Monocytes Absolute: 1 10*3/uL (ref 0.1–1.0)
Monocytes Relative: 8 %
Neutro Abs: 9.2 10*3/uL — ABNORMAL HIGH (ref 1.7–7.7)
Neutrophils Relative %: 82 %
Platelets: 649 10*3/uL — ABNORMAL HIGH (ref 150–400)
RBC: 4.31 MIL/uL (ref 3.87–5.11)
RDW: 14.7 % (ref 11.5–15.5)
WBC: 11.4 10*3/uL — ABNORMAL HIGH (ref 4.0–10.5)
nRBC: 0 % (ref 0.0–0.2)

## 2020-12-27 LAB — RESP PANEL BY RT-PCR (FLU A&B, COVID) ARPGX2
Influenza A by PCR: NEGATIVE
Influenza B by PCR: NEGATIVE
SARS Coronavirus 2 by RT PCR: NEGATIVE

## 2020-12-27 LAB — I-STAT BETA HCG BLOOD, ED (MC, WL, AP ONLY): I-stat hCG, quantitative: <5 m[IU]/mL (ref <5)

## 2020-12-27 LAB — BETA-HYDROXYBUTYRIC ACID: Beta-Hydroxybutyric Acid: 1.2 mmol/L — ABNORMAL HIGH (ref 0.05–0.27)

## 2020-12-27 MED ORDER — INSULIN ASPART 100 UNIT/ML IJ SOLN: 6.0000 [IU] | Freq: Once | INTRAMUSCULAR | Status: DC

## 2020-12-27 MED ORDER — HYDRALAZINE HCL 20 MG/ML IJ SOLN: 10.0000 mg | Freq: Once | INTRAMUSCULAR | Status: DC

## 2020-12-27 MED ORDER — SODIUM CHLORIDE 0.9 % IV BOLUS
1000.0000 mL | Freq: Once | INTRAVENOUS | Status: AC
  Administered 2020-12-27: 1000 mL via INTRAVENOUS

## 2020-12-27 MED ORDER — HYDRALAZINE HCL 25 MG PO TABS
50.0000 mg | ORAL_TABLET | Freq: Once | ORAL | Status: AC
  Administered 2020-12-27: 50 mg via ORAL
  Filled 2020-12-27: qty 2

## 2020-12-27 MED ORDER — ONDANSETRON 4 MG PO TBDP
4.0000 mg | ORAL_TABLET | Freq: Once | ORAL | Status: AC
  Administered 2020-12-27: 4 mg via ORAL
  Filled 2020-12-27: qty 1

## 2020-12-27 MED ORDER — CARVEDILOL 12.5 MG PO TABS: 25.0000 mg | ORAL_TABLET | Freq: Two times a day (BID) | ORAL | Status: DC

## 2020-12-27 MED ORDER — PROCHLORPERAZINE EDISYLATE 10 MG/2ML IJ SOLN
10.0000 mg | Freq: Once | INTRAMUSCULAR | Status: DC
  Filled 2020-12-27: qty 2

## 2020-12-27 MED ORDER — INSULIN ASPART 100 UNIT/ML IJ SOLN
3.0000 [IU] | Freq: Once | INTRAMUSCULAR | Status: AC
  Administered 2020-12-27: 3 [IU] via SUBCUTANEOUS

## 2020-12-27 MED ORDER — PROCHLORPERAZINE EDISYLATE 10 MG/2ML IJ SOLN
10.0000 mg | Freq: Once | INTRAMUSCULAR | Status: AC
  Administered 2020-12-27: 10 mg via INTRAVENOUS
  Filled 2020-12-27: qty 2

## 2020-12-27 NOTE — ED Triage Notes (Signed)
Pt to triage via [facility] from home.  Reports abd pain, nausea, and vomiting x 2 days.  Peritoneal dialysis at home.

## 2020-12-27 NOTE — Discharge Instructions (Signed)
Please follow-up with your PCP in the next 3 days to discuss your ED visit.  Your labs are concerning for hyperglycemia and signs of ketone formation.  Please continue taking your home insulin as prescribed.  Return to the ED with any worsening vomiting, weakness, fevers, concerns for dehydration.

## 2020-12-27 NOTE — ED Provider Notes (Signed)
Emergency Medicine Provider Triage Evaluation Note  Barbara Donovan , a 30 y.o. female  was evaluated in triage.  Pt complains of abdominal pain, nausea and vomiting for 2 days.  She states that yesterday she had onset of cramping constant abdominal pain.  She states that she has had constant nausea and vomiting.  Describes it as bilious, nonbloody emesis.  Denies diarrhea.  She denies fevers or abdominal distention.  She is a peritoneal dialysis patient and she states that last peritoneal treatment was last night without complication.  Review of Systems  Positive: See above Negative:   Physical Exam  There were no vitals taken for this visit. Gen:   Awake, actively vomiting and heaving on exam Resp:  Normal effort, lungs clear MSK:   Moves extremities without difficulty  Other:  Abdomen is soft, mildly tender to palpation.  Peritoneal dialysis catheter in place  Medical Decision Making  Medically screening exam initiated at 2:48 PM.  Appropriate orders placed.  Barbara Donovan was informed that the remainder of the evaluation will be completed by another provider, this initial triage assessment does not replace that evaluation, and the importance of remaining in the ED until their evaluation is complete.     Mickie Hillier, PA-C 12/27/20 1449    Barbara Fuse, MD 12/29/20 [phone]

## 2020-12-27 NOTE — ED Provider Notes (Signed)
[facility] EMERGENCY DEPARTMENT Provider Note   CSN: [id number] Arrival date & time: 12/27/20  1416     History Chief Complaint  Patient presents with   Abdominal Pain   Vomiting    Barbara Donovan is a 30 y.o. female.  The history is provided by the patient and medical records.  Emesis Severity:  Moderate Duration:  2 days Timing:  Intermittent Number of daily episodes:  10+ Quality:  Stomach contents Feeding tolerance: none. Progression:  Unchanged Chronicity:  New Context comment:  Hx type 1 DM, hx gastroparesis Relieved by:  Nothing Worsened by:  Nothing Ineffective treatments:  Antiemetics Associated symptoms: abdominal pain and chills   Associated symptoms: no arthralgias, no cough, no fever and no sore throat       Past Medical History:  Diagnosis Date   Asthma    as a child, no problems as an adult, no inhaler   Cataract    NS OU   Chronic hypertension during pregnancy, antepartum 08/19/2017   Dehydration 01/28/2018   Depression during pregnancy, antepartum 07/07/2017   6/20: Short trial of zoloft previously, reports didn't help much but also didn't give it a chance Discussed r/b/a SSRIs in pregnancy, agrees to try Zoloft again, rx sent No SI/HI/red flags   Diabetes ([place])    TYPE I. A1C 7.5% 05/31/20   Diabetic retinopathy ([place]) 06/09/2017   07/2017 with bilateral severe diabetic non-proliferative retinopathy with macular edema.   ESRD (end stage renal disease) on dialysis ([place])    4 x week  M T TH F   HTN (hypertension)    Hypertensive retinopathy    OU   Hypokalemia 01/22/2018   Hypomagnesemia 01/28/2018   Intractable nausea and vomiting 01/22/2018   Intrauterine growth restriction (IUGR) affecting care of mother 12/22/2017   Morbid obesity ([place])    Nephropathy, diabetic ([place]) 12/29/2017   Peritoneal dialysis status ([place])    Proteinuria due to type 1 diabetes mellitus ([place]) 11/02/2017   Baseline Pr: Cr 10.23   Severe  hyperemesis gravidarum 10/30/2017   Type I diabetes mellitus ([place]) 07/07/2017   Current Diabetic Medications:  Insulin  [x]  Aspirin 81 mg daily after 12 weeks (? A2/B GDM)  Required Referrals for A1GDM or A2GDM: [x]  Diabetes Education and Testing Supplies [x]  Nutrition Cousult  For A2/B GDM or higher classes of DM [x]  Diabetes Education and Testing Supplies [x]  Nutrition Counsult [x]  Fetal ECHO after 20 weeks  [x]  Eye exam for retina evaluation - severe retinopathy 7/19  Base   Ventricular septal defect (VSD) of fetus in singleton pregnancy, antepartum 09/30/2017   May go to newborn nursery per Dr. Lenard Simmer Echo prior to discharge    Patient Active Problem List   Diagnosis Date Noted   DKA, type 1 ([place]) 11/10/2020   Type 1 diabetes mellitus with hyperglycemia ([place]) 11/10/2020   ESRD (end stage renal disease) ([place]) 11/10/2020   Renal failure (ARF), acute on chronic ([place]) 06/01/2020   ARF (acute renal failure) (H[facility])    Nausea & vomiting    Diabetic gastroparesis ([place]) 03/04/2020   Anemia due to stage 5 chronic kidney disease ([place]) 03/04/2020   Diabetic ketoacidosis without coma associated with type 1 diabetes mellitus ([place]) 02/09/2020   Hypothermia 01/24/2020   DKA (diabetic ketoacidosis) ([place]) 09/14/2019   COVID-19 virus infection 09/14/2019   SOB (shortness of breath) 03/27/2018   Hypomagnesemia 01/28/2018   Dehydration 01/28/2018   Intractable nausea and vomiting 01/22/2018   Hypertensive urgency 01/22/2018  Hypokalemia 01/22/2018   Nephropathy, diabetic ([place]) 12/29/2017   Proteinuria due to type 1 diabetes mellitus ([place]) 11/02/2017   Chronic hypertension 08/21/2017   Uncontrolled type 1 diabetes mellitus with stage 5 chronic kidney disease not on chronic dialysis, with long-term current use of insulin 07/07/2017   Diabetic retinopathy ([place]) 06/09/2017   Acute depression 11/02/2014   Asthma 10/30/2013    Past Surgical History:  Procedure Laterality Date   25 GAUGE PARS PLANA  VITRECTOMY WITH 20 GAUGE MVR PORT FOR MACULAR HOLE Left 07/20/2018   Procedure: 25 GAUGE PARS PLANA VITRECTOMY LEFT EYE;  Surgeon: Bernarda Caffey, MD;  Location: [place];  Service: Ophthalmology;  Laterality: Left;   CAPD INSERTION N/A 09/10/2020   Procedure: LAPAROSCOPIC INSERTION CONTINUOUS AMBULATORY PERITONEAL DIALYSIS  (CAPD) CATHETER;  Surgeon: Serafina Mitchell, MD;  Location: [place];  Service: Vascular;  Laterality: N/A;   CESAREAN SECTION N/A 12/26/2017   Procedure: CESAREAN SECTION;  Surgeon: Osborne Oman, MD;  Location: [place];  Service: Obstetrics;  Laterality: N/A;   EYE EXAMINATION UNDER ANESTHESIA Right 07/20/2018   Procedure: Eye Exam Under Anesthesia RIGHT EYE;  Surgeon: Bernarda Caffey, MD;  Location: [place];  Service: Ophthalmology;  Laterality: Right;   EYE SURGERY Left 07/2018   GAS INSERTION Left 07/19/2019   Procedure: INSERTION OF GAS;  Surgeon: Bernarda Caffey, MD;  Location: [place];  Service: Ophthalmology;  Laterality: Left;  SF6   INJECTION OF SILICONE OIL Left 03/24/6281   Procedure: Injection Of Silicone Oil LEFT EYE;  Surgeon: Bernarda Caffey, MD;  Location: [place];  Service: Ophthalmology;  Laterality: Left;   IR FLUORO GUIDE CV LINE RIGHT  06/02/2020   IR US GUIDE VASC ACCESS RIGHT  06/02/2020   LASER PHOTO ABLATION Right 07/20/2018   Procedure: Laser Photo Ablation RIGHT EYE;  Surgeon: Bernarda Caffey, MD;  Location: [place];  Service: Ophthalmology;  Laterality: Right;   MEMBRANE PEEL Left 07/20/2018   Procedure: Membrane Peel LEFT EYE;  Surgeon: Bernarda Caffey, MD;  Location: [place];  Service: Ophthalmology;  Laterality: Left;   MEMBRANE PEEL Left 07/19/2019   Procedure: MEMBRANE PEEL;  Surgeon: Bernarda Caffey, MD;  Location: [place];  Service: Ophthalmology;  Laterality: Left;   MITOMYCIN C APPLICATION Bilateral 01/23/1759   Procedure: Avastin Application;  Surgeon: Bernarda Caffey, MD;  Location: [place];  Service: Ophthalmology;  Laterality: Bilateral;   PHOTOCOAGULATION WITH LASER  Left 07/20/2018   Procedure: Photocoagulation With Laser LEFT EYE;  Surgeon: Bernarda Caffey, MD;  Location: [place];  Service: Ophthalmology;  Laterality: Left;   PHOTOCOAGULATION WITH LASER Left 07/19/2019   Procedure: PHOTOCOAGULATION WITH LASER;  Surgeon: Bernarda Caffey, MD;  Location: [place];  Service: Ophthalmology;  Laterality: Left;   RETINAL DETACHMENT SURGERY Left 07/20/2018   Dr. Bernarda Caffey   SILICON OIL REMOVAL Left 07/19/2019   Procedure: 25g PARS PLANA VITRECTOMY WITH SILICON OIL REMOVAL;  Surgeon: Bernarda Caffey, MD;  Location: [place];  Service: Ophthalmology;  Laterality: Left;   WISDOM TOOTH EXTRACTION       OB History     Gravida  1   Para  1   Term      Preterm  1   AB      Living  1      SAB      IAB      Ectopic      Multiple  0   Live Births  1  Family History  Problem Relation Age of Onset   Diabetes Mother    Aneurysm Mother 36   Seizures Mother    Diabetes Father    Cataracts Father    COPD Father    Heart attack Father    Heart disease Father    Healthy Sister    Healthy Daughter    Stroke Maternal Grandfather    Amblyopia Neg Hx    Blindness Neg Hx    Glaucoma Neg Hx    Macular degeneration Neg Hx    Retinal detachment Neg Hx    Strabismus Neg Hx    Retinitis pigmentosa Neg Hx    Colon cancer Neg Hx    Stomach cancer Neg Hx    Esophageal cancer Neg Hx    Pancreatic cancer Neg Hx    Liver disease Neg Hx     Social History   Tobacco Use   Smoking status: Never   Smokeless tobacco: Never  Vaping Use   Vaping Use: Never used  Substance Use Topics   Alcohol use: Not Currently   Drug use: Never    Home Medications Prior to Admission medications   Medication Sig Start Date End Date Taking? Authorizing Provider  acetaminophen (TYLENOL) 500 MG tablet Take 1,000 mg by mouth every 6 (six) hours as needed for mild pain.   Yes [provider]  amLODipine (NORVASC) 10 MG tablet Take 1 tablet (10 mg total) by mouth  daily. 03/09/20  Yes Mercy Riding, MD  calcitRIOL (ROCALTROL) 0.25 MCG capsule Take 0.25 mcg by mouth daily. 12/02/20  Yes [provider]  carvedilol (COREG) 25 MG tablet Take 1 tablet (25 mg total) by mouth 2 (two) times daily. 03/07/19  Yes Imogene Burn, PA-C  Continuous Blood Gluc Sensor MISC 1 each by Does not apply route as directed. Use as directed every 14 days. May dispense FreeStyle [employer] or similar. 01/27/20  Yes Kc, Maren Beach, MD  escitalopram (LEXAPRO) 5 MG tablet Take 5 mg by mouth daily. 12/22/20  Yes [provider]  gentamicin cream (GARAMYCIN) 0.1 % Apply 1 application topically daily. 09/10/20  Yes [provider]  glucose blood (ONETOUCH VERIO) test strip 1 each by Other route 3 (three) times daily. And lancets 3/day 11/28/20  Yes Renato Shin, MD  hydrALAZINE (APRESOLINE) 50 MG tablet Take 2 tablets (100 mg total) by mouth 3 (three) times daily. Patient taking differently: Take 50 mg by mouth 3 (three) times daily. 06/09/20  Yes Vann, Ricki U, DO  insulin glargine (LANTUS SOLOSTAR) 100 UNIT/ML Solostar Pen Inject 20 Units into the skin every morning. 11/13/20  Yes Thurnell Lose, MD  insulin lispro (HUMALOG KWIKPEN) 100 UNIT/ML KwikPen Inject 3 Units into the skin 3 (three) times daily with meals. 11/28/20  Yes Renato Shin, MD  losartan (COZAAR) 50 MG tablet Take 2 tablets (100 mg total) by mouth daily. 06/09/20  Yes Geradine Girt, DO  metoCLOPramide (REGLAN) 5 MG tablet Take 1 tablet (5 mg total) by mouth 4 (four) times daily -  before meals and at bedtime. 08/28/20  Yes Levin Erp, PA  multivitamin (RENA-VIT) TABS tablet Take 1 tablet by mouth at bedtime. 03/08/20  Yes Mercy Riding, MD  ondansetron (ZOFRAN ODT) 4 MG disintegrating tablet Take 1 tablet (4 mg total) by mouth every 8 (eight) hours as needed for nausea or vomiting. 11/13/20  Yes Thurnell Lose, MD  promethazine (PHENERGAN) 25 MG suppository Place 1  suppository (25  mg total) rectally every 6 (six) hours as needed for nausea or vomiting. 11/25/20  Yes Charlann Lange, PA-C    Allergies    Patient has no known allergies.  Review of Systems   Review of Systems  Constitutional:  Positive for chills. Negative for fever.  HENT:  Negative for ear pain and sore throat.   Eyes:  Negative for pain and visual disturbance.  Respiratory:  Negative for cough and shortness of breath.   Cardiovascular:  Negative for chest pain and palpitations.  Gastrointestinal:  Positive for abdominal pain, nausea and vomiting.  Genitourinary:  Negative for dysuria and hematuria.  Musculoskeletal:  Negative for arthralgias and back pain.  Skin:  Negative for color change and rash.  Neurological:  Negative for seizures and syncope.  All other systems reviewed and are negative.  Physical Exam Updated Vital Signs BP 126/81 (BP Location: Right Arm)   Pulse (!) 110   Temp 99.9 F (37.7 C) (Oral)   Resp 16   SpO2 97%   Physical Exam Vitals and nursing note reviewed.  Constitutional:      General: She is in acute distress (Mild acute distress, appears uncomfortable, clutching emesis bag).     Appearance: Normal appearance. She is well-developed.  HENT:     Head: Normocephalic and atraumatic.     Right Ear: External ear normal.     Left Ear: External ear normal.     Nose: Nose normal. No congestion or rhinorrhea.     Mouth/Throat:     Mouth: Mucous membranes are moist.  Eyes:     Extraocular Movements: Extraocular movements intact.     Conjunctiva/sclera: Conjunctivae normal.     Pupils: Pupils are equal, round, and reactive to light.  Cardiovascular:     Rate and Rhythm: Regular rhythm. Tachycardia present.     Pulses: Normal pulses.     Heart sounds: No murmur heard. Pulmonary:     Effort: Pulmonary effort is normal. No respiratory distress.     Breath sounds: Normal breath sounds. No wheezing, rhonchi or rales.  Abdominal:     General: Abdomen is  flat. Bowel sounds are normal.     Palpations: Abdomen is soft.     Tenderness: There is generalized abdominal tenderness and tenderness in the epigastric area. There is no guarding or rebound.  Musculoskeletal:        General: No swelling, tenderness or deformity.     Cervical back: Normal range of motion and neck supple. No rigidity.  Skin:    General: Skin is warm and dry.     Capillary Refill: Capillary refill takes less than 2 seconds.  Neurological:     General: No focal deficit present.     Mental Status: She is alert and oriented to person, place, and time.  Psychiatric:        Mood and Affect: Mood normal.    ED Results / Procedures / Treatments   Labs (all labs ordered are listed, but only abnormal results are displayed) Labs Reviewed  COMPREHENSIVE METABOLIC PANEL - Abnormal; Notable for the following components:      Result Value   CO2 18 (*)    Glucose, Bld 206 (*)    BUN 50 (*)    Creatinine, Ser 11.07 (*)    Total Protein 8.3 (*)    GFR, Estimated 4 (*)    Anion gap 22 (*)    All other components within normal limits  CBC WITH DIFFERENTIAL/PLATELET - Abnormal; Notable for  the following components:   WBC 11.4 (*)    Platelets 649 (*)    Neutro Abs 9.2 (*)    All other components within normal limits  BETA-HYDROXYBUTYRIC ACID - Abnormal; Notable for the following components:   Beta-Hydroxybutyric Acid 1.20 (*)    All other components within normal limits  BASIC METABOLIC PANEL - Abnormal; Notable for the following components:   CO2 18 (*)    Glucose, Bld 275 (*)    BUN 54 (*)    Creatinine, Ser 11.46 (*)    GFR, Estimated 4 (*)    Anion gap 20 (*)    All other components within normal limits  CBG MONITORING, ED - Abnormal; Notable for the following components:   Glucose-Capillary 187 (*)    All other components within normal limits  I-STAT VENOUS BLOOD GAS, ED - Abnormal; Notable for the following components:   pH, Ven 7.501 (*)    pCO2, Ven 27.6 (*)     pO2, Ven 149.0 (*)    Calcium, Ion 1.02 (*)    All other components within normal limits  CBG MONITORING, ED - Abnormal; Notable for the following components:   Glucose-Capillary 226 (*)    All other components within normal limits  RESP PANEL BY RT-PCR (FLU A&B, COVID) ARPGX2  LIPASE, BLOOD  URINALYSIS, ROUTINE W REFLEX MICROSCOPIC  I-STAT BETA HCG BLOOD, ED ([facility], [facility], [facility] ONLY)    EKG None  Radiology No results found.  Procedures Procedures   Medications Ordered in ED Medications  prochlorperazine (COMPAZINE) injection 10 mg (has no administration in time range)  ondansetron (ZOFRAN-ODT) disintegrating tablet 4 mg (4 mg Oral Given 12/27/20 1457)  sodium chloride 0.9 % bolus 1,000 mL (0 mLs Intravenous Stopped 12/27/20 1904)  prochlorperazine (COMPAZINE) injection 10 mg (10 mg Intravenous Given 12/27/20 1801)  hydrALAZINE (APRESOLINE) tablet 50 mg (50 mg Oral Given 12/27/20 1957)  insulin aspart (novoLOG) injection 3 Units (3 Units Subcutaneous Given 12/27/20 2304)    ED Course  I have reviewed the triage vital signs and the nursing notes.  Pertinent labs & imaging results that were available during my care of the patient were reviewed by me and considered in my medical decision making (see chart for details).    MDM Rules/Calculators/A&P                          30 year old female with a history of type 1 diabetes, ESRD on PD, gastroparesis who is presenting with nausea and vomiting.  Exam as above.  Patient does describe similar symptoms in the past related to gastroparesis.  She is mildly hyperglycemic to the 200s here. Does have a bicarb of 18 and an anion gap of 22 however her VBG showed normal pH and her ketones are only slightly elevated.  She is afebrile here.  Has no focal tenderness on exam.  I have a low suspicion for obstruction, SBP, intra-abdominal infection.  Patient given IV fluids and Compazine.  On reassessment, she is resting comfortably.  Has had complete  resolution of vomiting.  She is tolerating p.o.  Repeat BMP obtained.  Does continue to show bicarb of 18 and anion gap of 20.  She remains hyperglycemic in the mid 200s.  At this time, recommended admission for further evaluation and management of vomiting and close glucose monitoring. Patient refusing admission at this time.  I expressed my concerns with going home. Risks and benefits discussed.  She remains adamant about  being discharged.  She was given a dose of subcu insulin. Repeat glucose improving.  Abdomen remains benign.  She is tolerating p.o.  Remains hemodynamically stable.  Appropriate for discharge with close follow-up with PCP.  Strict return precautions provided.  Final Clinical Impression(s) / ED Diagnoses Final diagnoses:  Nausea and vomiting, unspecified vomiting type  Hyperglycemia    Rx / DC Orders ED Discharge Orders     None        Idamae Lusher, MD 12/27/20 2334    Lucrezia Starch, MD 12/28/20 1536

## 2020-12-27 NOTE — ED Notes (Signed)
Patient actively vomiting prior to d/c.  MD made aware.  Patient continues to want to go home

## 2020-12-27 NOTE — ED Notes (Signed)
Patient provided with ice chips and crackers for PO challenge.  Currently resting with eyes closed.  Lights in room dimmed for comfort

## 2020-12-28 ENCOUNTER — Observation Stay (HOSPITAL_COMMUNITY): Payer: 59

## 2020-12-28 ENCOUNTER — Encounter (HOSPITAL_COMMUNITY): Payer: Self-pay

## 2020-12-28 ENCOUNTER — Inpatient Hospital Stay (HOSPITAL_COMMUNITY)
Admission: EM | Admit: 2020-12-28 | Discharge: 2020-12-31 | DRG: 073 | Disposition: A | Discharge status: Home / Self Care | Payer: 59 | Attending: Internal Medicine | Admitting: Internal Medicine

## 2020-12-28 DIAGNOSIS — I160 Hypertensive urgency: Secondary | ICD-10-CM | POA: Diagnosis not present

## 2020-12-28 DIAGNOSIS — N2581 Secondary hyperparathyroidism of renal origin: Secondary | ICD-10-CM | POA: Diagnosis present

## 2020-12-28 DIAGNOSIS — Z992 Dependence on renal dialysis: Secondary | ICD-10-CM

## 2020-12-28 DIAGNOSIS — E10319 Type 1 diabetes mellitus with unspecified diabetic retinopathy without macular edema: Secondary | ICD-10-CM | POA: Diagnosis present

## 2020-12-28 DIAGNOSIS — I120 Hypertensive chronic kidney disease with stage 5 chronic kidney disease or end stage renal disease: Secondary | ICD-10-CM | POA: Diagnosis present

## 2020-12-28 DIAGNOSIS — E1143 Type 2 diabetes mellitus with diabetic autonomic (poly)neuropathy: Secondary | ICD-10-CM

## 2020-12-28 DIAGNOSIS — M898X9 Other specified disorders of bone, unspecified site: Secondary | ICD-10-CM | POA: Diagnosis present

## 2020-12-28 DIAGNOSIS — E1065 Type 1 diabetes mellitus with hyperglycemia: Secondary | ICD-10-CM

## 2020-12-28 DIAGNOSIS — Z20822 Contact with and (suspected) exposure to covid-19: Secondary | ICD-10-CM | POA: Diagnosis present

## 2020-12-28 DIAGNOSIS — E1010 Type 1 diabetes mellitus with ketoacidosis without coma: Secondary | ICD-10-CM | POA: Diagnosis present

## 2020-12-28 DIAGNOSIS — E1043 Type 1 diabetes mellitus with diabetic autonomic (poly)neuropathy: Secondary | ICD-10-CM | POA: Diagnosis not present

## 2020-12-28 DIAGNOSIS — Z833 Family history of diabetes mellitus: Secondary | ICD-10-CM

## 2020-12-28 DIAGNOSIS — Z79899 Other long term (current) drug therapy: Secondary | ICD-10-CM

## 2020-12-28 DIAGNOSIS — Z794 Long term (current) use of insulin: Secondary | ICD-10-CM

## 2020-12-28 DIAGNOSIS — E872 Acidosis, unspecified: Secondary | ICD-10-CM

## 2020-12-28 DIAGNOSIS — R739 Hyperglycemia, unspecified: Secondary | ICD-10-CM | POA: Diagnosis present

## 2020-12-28 DIAGNOSIS — N186 End stage renal disease: Secondary | ICD-10-CM | POA: Diagnosis not present

## 2020-12-28 DIAGNOSIS — D649 Anemia, unspecified: Secondary | ICD-10-CM | POA: Diagnosis present

## 2020-12-28 DIAGNOSIS — Z8249 Family history of ischemic heart disease and other diseases of the circulatory system: Secondary | ICD-10-CM

## 2020-12-28 DIAGNOSIS — K3184 Gastroparesis: Secondary | ICD-10-CM

## 2020-12-28 DIAGNOSIS — E1022 Type 1 diabetes mellitus with diabetic chronic kidney disease: Secondary | ICD-10-CM | POA: Diagnosis present

## 2020-12-28 DIAGNOSIS — R059 Cough, unspecified: Secondary | ICD-10-CM | POA: Diagnosis not present

## 2020-12-28 DIAGNOSIS — J45909 Unspecified asthma, uncomplicated: Secondary | ICD-10-CM | POA: Diagnosis present

## 2020-12-28 DIAGNOSIS — R112 Nausea with vomiting, unspecified: Secondary | ICD-10-CM | POA: Diagnosis not present

## 2020-12-28 DIAGNOSIS — Z825 Family history of asthma and other chronic lower respiratory diseases: Secondary | ICD-10-CM

## 2020-12-28 DIAGNOSIS — R6510 Systemic inflammatory response syndrome (SIRS) of non-infectious origin without acute organ dysfunction: Secondary | ICD-10-CM | POA: Diagnosis present

## 2020-12-28 DIAGNOSIS — F32A Depression, unspecified: Secondary | ICD-10-CM

## 2020-12-28 LAB — CBC
HCT: 37.9 % (ref 36.0–46.0)
Hemoglobin: 12.2 g/dL (ref 12.0–15.0)
MCH: 28.5 pg (ref 26.0–34.0)
MCHC: 32.2 g/dL (ref 30.0–36.0)
MCV: 88.6 fL (ref 80.0–100.0)
Platelets: 625 10*3/uL — ABNORMAL HIGH (ref 150–400)
RBC: 4.28 MIL/uL (ref 3.87–5.11)
RDW: 14.8 % (ref 11.5–15.5)
WBC: 13.5 10*3/uL — ABNORMAL HIGH (ref 4.0–10.5)
nRBC: 0.1 % (ref 0.0–0.2)

## 2020-12-28 LAB — URINALYSIS, ROUTINE W REFLEX MICROSCOPIC
Bilirubin Urine: NEGATIVE
Glucose, UA: >=500 mg/dL — AB
Ketones, ur: 5 mg/dL — AB
Leukocytes,Ua: NEGATIVE
Nitrite: NEGATIVE
Protein, ur: 100 mg/dL — AB
Specific Gravity, Urine: 1.013 (ref 1.005–1.030)
pH: 6 (ref 5.0–8.0)

## 2020-12-28 LAB — BASIC METABOLIC PANEL
Anion gap: 17 — ABNORMAL HIGH (ref 5–15)
BUN: 61 mg/dL — ABNORMAL HIGH (ref 6–20)
CO2: 22 mmol/L (ref 22–32)
Calcium: 9.1 mg/dL (ref 8.9–10.3)
Chloride: 98 mmol/L (ref 98–111)
Creatinine, Ser: 12.03 mg/dL — ABNORMAL HIGH (ref 0.44–1.00)
GFR, Estimated: 4 mL/min — ABNORMAL LOW (ref >60)
Glucose, Bld: 192 mg/dL — ABNORMAL HIGH (ref 70–99)
Potassium: 3.9 mmol/L (ref 3.5–5.1)
Sodium: 137 mmol/L (ref 135–145)

## 2020-12-28 LAB — I-STAT BETA HCG BLOOD, ED (MC, WL, AP ONLY): I-stat hCG, quantitative: <5 m[IU]/mL (ref <5)

## 2020-12-28 LAB — COMPREHENSIVE METABOLIC PANEL
ALT: 17 U/L (ref 0–44)
AST: 21 U/L (ref 15–41)
Albumin: 3.8 g/dL (ref 3.5–5.0)
Alkaline Phosphatase: 81 U/L (ref 38–126)
Anion gap: 25 — ABNORMAL HIGH (ref 5–15)
BUN: 58 mg/dL — ABNORMAL HIGH (ref 6–20)
CO2: 17 mmol/L — ABNORMAL LOW (ref 22–32)
Calcium: 9.6 mg/dL (ref 8.9–10.3)
Chloride: 93 mmol/L — ABNORMAL LOW (ref 98–111)
Creatinine, Ser: 12.04 mg/dL — ABNORMAL HIGH (ref 0.44–1.00)
GFR, Estimated: 4 mL/min — ABNORMAL LOW (ref >60)
Glucose, Bld: 341 mg/dL — ABNORMAL HIGH (ref 70–99)
Potassium: 3.9 mmol/L (ref 3.5–5.1)
Sodium: 135 mmol/L (ref 135–145)
Total Bilirubin: 1 mg/dL (ref 0.3–1.2)
Total Protein: 7.9 g/dL (ref 6.5–8.1)

## 2020-12-28 LAB — I-STAT VENOUS BLOOD GAS, ED
Acid-base deficit: 1 mmol/L (ref 0.0–2.0)
Bicarbonate: 21.8 mmol/L (ref 20.0–28.0)
Calcium, Ion: 0.98 mmol/L — ABNORMAL LOW (ref 1.15–1.40)
HCT: 40 % (ref 36.0–46.0)
Hemoglobin: 13.6 g/dL (ref 12.0–15.0)
O2 Saturation: 99 %
Potassium: 3.9 mmol/L (ref 3.5–5.1)
Sodium: 135 mmol/L (ref 135–145)
TCO2: 23 mmol/L (ref 22–32)
pCO2, Ven: 30.4 mmHg — ABNORMAL LOW (ref 44.0–60.0)
pH, Ven: 7.462 — ABNORMAL HIGH (ref 7.250–7.430)
pO2, Ven: 107 mmHg — ABNORMAL HIGH (ref 32.0–45.0)

## 2020-12-28 LAB — LIPASE, BLOOD: Lipase: 42 U/L (ref 11–51)

## 2020-12-28 LAB — CBG MONITORING, ED
Glucose-Capillary: 330 mg/dL — ABNORMAL HIGH (ref 70–99)
Glucose-Capillary: 210 mg/dL — ABNORMAL HIGH (ref 70–99)

## 2020-12-28 LAB — BETA-HYDROXYBUTYRIC ACID: Beta-Hydroxybutyric Acid: 3.18 mmol/L — ABNORMAL HIGH (ref 0.05–0.27)

## 2020-12-28 IMAGING — DX DG CHEST 1V PORT
1 series · 1 of 1 positions shown · non-contrast
Comparison: [DATE]

CLINICAL DATA: Nausea, vomiting

EXAM:
PORTABLE CHEST 1 VIEW

[chest]
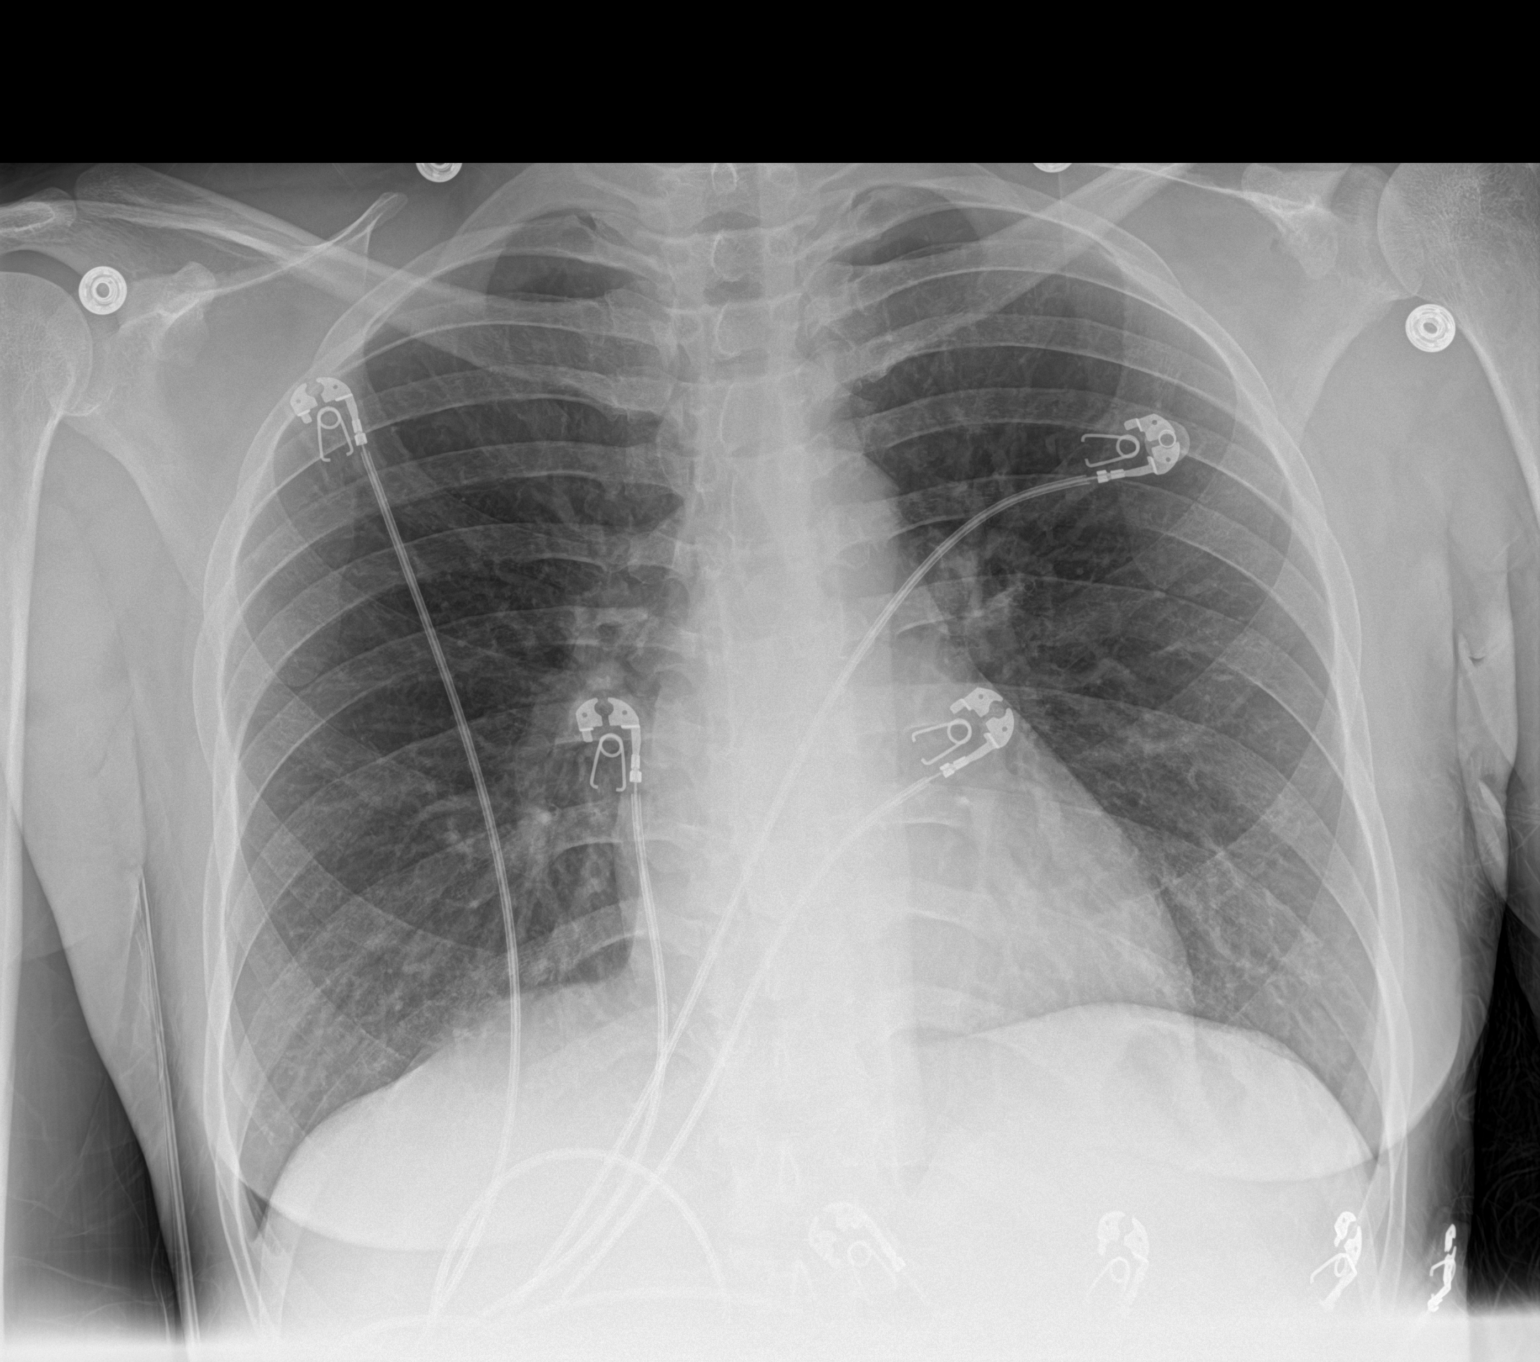

[1 of 1 positions shown; findings below may reference images not displayed]

FINDINGS: Heart and mediastinal contours are within normal limits. No focal
opacities or effusions. No acute bony abnormality.
IMPRESSION: Negative.

## 2020-12-28 MED ORDER — HEPARIN SODIUM (PORCINE) 5000 UNIT/ML IJ SOLN
5000.0000 [IU] | Freq: Three times a day (TID) | INTRAMUSCULAR | Status: DC
  Administered 2020-12-28 – 2020-12-31 (×9): 5000 [IU] via SUBCUTANEOUS
  Filled 2020-12-28 (×8): qty 1

## 2020-12-28 MED ORDER — CALCITRIOL 0.25 MCG PO CAPS
0.2500 ug | ORAL_CAPSULE | Freq: Every day | ORAL | Status: DC
  Administered 2020-12-28 – 2020-12-31 (×4): 0.25 ug via ORAL
  Filled 2020-12-28 (×4): qty 1

## 2020-12-28 MED ORDER — SODIUM CHLORIDE 0.9% FLUSH
3.0000 mL | Freq: Two times a day (BID) | INTRAVENOUS | Status: DC
  Administered 2020-12-28 – 2020-12-31 (×6): 3 mL via INTRAVENOUS

## 2020-12-28 MED ORDER — HEPARIN 1000 UNIT/ML FOR PERITONEAL DIALYSIS: 500.0000 [IU] | INTRAMUSCULAR | Status: DC | PRN

## 2020-12-28 MED ORDER — ONDANSETRON HCL 4 MG/2ML IJ SOLN
4.0000 mg | Freq: Once | INTRAMUSCULAR | Status: DC
  Filled 2020-12-28: qty 2

## 2020-12-28 MED ORDER — ONDANSETRON HCL 4 MG/2ML IJ SOLN: 4.0000 mg | Freq: Four times a day (QID) | INTRAMUSCULAR | Status: DC | PRN

## 2020-12-28 MED ORDER — SODIUM CHLORIDE 0.9 % IV BOLUS
1000.0000 mL | Freq: Once | INTRAVENOUS | Status: AC
  Administered 2020-12-28: 1000 mL via INTRAVENOUS

## 2020-12-28 MED ORDER — PANTOPRAZOLE SODIUM 40 MG IV SOLR
40.0000 mg | INTRAVENOUS | Status: DC
  Administered 2020-12-28 – 2020-12-30 (×3): 40 mg via INTRAVENOUS
  Filled 2020-12-28 (×3): qty 40

## 2020-12-28 MED ORDER — HALOPERIDOL LACTATE 5 MG/ML IJ SOLN
5.0000 mg | Freq: Once | INTRAMUSCULAR | Status: AC
  Administered 2020-12-28: 5 mg via INTRAVENOUS
  Filled 2020-12-28: qty 1

## 2020-12-28 MED ORDER — ACETAMINOPHEN 325 MG PO TABS
650.0000 mg | ORAL_TABLET | Freq: Four times a day (QID) | ORAL | Status: DC | PRN
  Administered 2020-12-30: 650 mg via ORAL
  Filled 2020-12-28: qty 2

## 2020-12-28 MED ORDER — LOSARTAN POTASSIUM 50 MG PO TABS
100.0000 mg | ORAL_TABLET | Freq: Every day | ORAL | Status: DC
  Administered 2020-12-28 – 2020-12-31 (×4): 100 mg via ORAL
  Filled 2020-12-28 (×4): qty 2

## 2020-12-28 MED ORDER — ESCITALOPRAM OXALATE 10 MG PO TABS
5.0000 mg | ORAL_TABLET | Freq: Every day | ORAL | Status: DC
  Administered 2020-12-28 – 2020-12-31 (×4): 5 mg via ORAL
  Filled 2020-12-28 (×4): qty 1

## 2020-12-28 MED ORDER — ONDANSETRON HCL 4 MG PO TABS: 4.0000 mg | ORAL_TABLET | Freq: Four times a day (QID) | ORAL | Status: DC | PRN

## 2020-12-28 MED ORDER — ACETAMINOPHEN 650 MG RE SUPP: 650.0000 mg | Freq: Four times a day (QID) | RECTAL | Status: DC | PRN

## 2020-12-28 MED ORDER — METOCLOPRAMIDE HCL 5 MG/ML IJ SOLN
5.0000 mg | Freq: Three times a day (TID) | INTRAMUSCULAR | Status: DC
  Administered 2020-12-28 – 2020-12-29 (×2): 5 mg via INTRAVENOUS
  Filled 2020-12-28 (×2): qty 2

## 2020-12-28 MED ORDER — DELFLEX-LC/2.5% DEXTROSE 394 MOSM/L IP SOLN: INTRAPERITONEAL | Status: DC

## 2020-12-28 MED ORDER — HYDRALAZINE HCL 20 MG/ML IJ SOLN
10.0000 mg | INTRAMUSCULAR | Status: DC | PRN
  Administered 2020-12-28: 10 mg via INTRAVENOUS
  Filled 2020-12-28: qty 1

## 2020-12-28 MED ORDER — PROMETHAZINE HCL 25 MG RE SUPP
25.0000 mg | Freq: Four times a day (QID) | RECTAL | Status: DC | PRN
  Filled 2020-12-28: qty 1

## 2020-12-28 MED ORDER — LABETALOL HCL 5 MG/ML IV SOLN
10.0000 mg | Freq: Once | INTRAVENOUS | Status: AC
  Administered 2020-12-28: 10 mg via INTRAVENOUS
  Filled 2020-12-28: qty 4

## 2020-12-28 MED ORDER — SODIUM CHLORIDE 0.9 % IV SOLN: INTRAVENOUS | Status: DC

## 2020-12-28 MED ORDER — SODIUM CHLORIDE 0.9 % IV SOLN
25.0000 mg | Freq: Once | INTRAVENOUS | Status: DC
  Filled 2020-12-28: qty 1

## 2020-12-28 MED ORDER — INSULIN ASPART 100 UNIT/ML IJ SOLN
0.0000 [IU] | Freq: Three times a day (TID) | INTRAMUSCULAR | Status: DC
  Administered 2020-12-28: 2 [IU] via SUBCUTANEOUS
  Administered 2020-12-29: 3 [IU] via SUBCUTANEOUS
  Administered 2020-12-29: 1 [IU] via SUBCUTANEOUS
  Administered 2020-12-29: 3 [IU] via SUBCUTANEOUS
  Administered 2020-12-30: 1 [IU] via SUBCUTANEOUS
  Administered 2020-12-30 – 2020-12-31 (×2): 3 [IU] via SUBCUTANEOUS

## 2020-12-28 MED ORDER — INSULIN GLARGINE-YFGN 100 UNIT/ML ~~LOC~~ SOLN
20.0000 [IU] | Freq: Every day | SUBCUTANEOUS | Status: DC
  Administered 2020-12-28 – 2020-12-29 (×2): 20 [IU] via SUBCUTANEOUS
  Filled 2020-12-28 (×3): qty 0.2

## 2020-12-28 MED ORDER — GENTAMICIN SULFATE 0.1 % EX CREA
1.0000 "application " | TOPICAL_CREAM | Freq: Every day | CUTANEOUS | Status: DC
  Administered 2020-12-29 – 2020-12-30 (×3): 1 via TOPICAL
  Filled 2020-12-28: qty 15

## 2020-12-28 MED ORDER — PROMETHAZINE HCL 25 MG PO TABS
25.0000 mg | ORAL_TABLET | Freq: Four times a day (QID) | ORAL | Status: DC | PRN
  Administered 2020-12-30: 25 mg via ORAL
  Filled 2020-12-28: qty 1

## 2020-12-28 MED ORDER — INSULIN ASPART 100 UNIT/ML IJ SOLN
3.0000 [IU] | Freq: Once | INTRAMUSCULAR | Status: AC
  Administered 2020-12-28: 3 [IU] via INTRAVENOUS

## 2020-12-28 MED ORDER — HYDRALAZINE HCL 50 MG PO TABS
50.0000 mg | ORAL_TABLET | Freq: Three times a day (TID) | ORAL | Status: DC
  Administered 2020-12-28 – 2020-12-31 (×8): 50 mg via ORAL
  Filled 2020-12-28: qty 2
  Filled 2020-12-28 (×7): qty 1

## 2020-12-28 MED ORDER — CARVEDILOL 25 MG PO TABS
25.0000 mg | ORAL_TABLET | Freq: Two times a day (BID) | ORAL | Status: DC
  Administered 2020-12-28 – 2020-12-31 (×6): 25 mg via ORAL
  Filled 2020-12-28 (×3): qty 1
  Filled 2020-12-28: qty 2
  Filled 2020-12-28 (×2): qty 1

## 2020-12-28 MED ORDER — ALBUTEROL SULFATE (2.5 MG/3ML) 0.083% IN NEBU: 2.5000 mg | INHALATION_SOLUTION | Freq: Four times a day (QID) | RESPIRATORY_TRACT | Status: DC | PRN

## 2020-12-28 MED ORDER — SODIUM CHLORIDE 0.9 % IV SOLN
25.0000 mg | Freq: Four times a day (QID) | INTRAVENOUS | Status: DC | PRN
  Administered 2020-12-29: 25 mg via INTRAVENOUS
  Filled 2020-12-28 (×5): qty 1

## 2020-12-28 MED ORDER — DELFLEX-LC/1.5% DEXTROSE 344 MOSM/L IP SOLN: INTRAPERITONEAL | Status: DC

## 2020-12-28 MED ORDER — AMLODIPINE BESYLATE 10 MG PO TABS
10.0000 mg | ORAL_TABLET | Freq: Every day | ORAL | Status: DC
  Administered 2020-12-28 – 2020-12-31 (×4): 10 mg via ORAL
  Filled 2020-12-28: qty 1
  Filled 2020-12-28: qty 2
  Filled 2020-12-28 (×2): qty 1

## 2020-12-28 NOTE — ED Notes (Signed)
Unable to flush IV.  Offered to restart IV to administer medication and patient refused.  Stated she felt better and was ready to go home

## 2020-12-28 NOTE — ED Notes (Addendum)
This RN called dialysis to inquire why this pt had not received dialysis. Per dialysis staff they will not be able to dialyze this pt until she is in her assigned room.

## 2020-12-28 NOTE — Progress Notes (Signed)
[facility] BRIEF PROGRESS NOTE  Barbara Donovan [id number] 1990-09-09  30 year old female with ESRD on PD, followed by Dr. Joylene Grapes.  Admitted to observation due to intractable n/v for the last 3 days.  Unable to keep down, foods, liquids or medications per patient.  Reports last PD on Friday night.  No issues with dialysis.  Denies abdominal pain, fever, chills, cloudy peritoneal fluid, CP, orthopnea, edema and SOB.  Pertinent findings in work up include hypertension, WBC 13.5, Scr 12 and BUN 58.   Patient appears comfortable, laying in bed.  No lower extremity edema or crackles on auscultation of the lungs.  Heart with RRR.  Abdomen soft, non tender, no distention.    OP PD orders: Outpatient nephrologist: Dr. Joylene Grapes PD 7x week 5 exchanges, no day dwell, EDW 63kg, Dwell time 1.5hr, fill volume 2L Using 4 exchanges 1.5% dextrose and 1 exchange 2.5% dextrose  Assessment/Plan:  Intractable n/v noted with Leukocytosis. Low suspicion for peritonitis with no abdominal pain but will check peritoneal fluid cultures and cell count to rule it out.  Orders written for PD tonight.  Will complete full consult if patient requires transition to inpatient status.   Jen Mow, PA-C [facility] Pager: [phone]

## 2020-12-28 NOTE — ED Notes (Signed)
Sophie RN called [facility] to notify them that per dialysis staff this pt will not receive dialysis until she is upstairs.

## 2020-12-28 NOTE — ED Notes (Signed)
Check patient bloodsugar was323mg 

## 2020-12-28 NOTE — H&P (Signed)
History and Physical    Barbara Donovan XTG:[id number] DOB: 1990/11/01 DOA: 12/28/2020  Referring MD/NP/PA: Mervyn Gay, PA-C PCP: Fanny Bien, MD  Patient coming from: Home  Chief Complaint: Nausea and vomiting  I have personally briefly reviewed patient's old medical records in [place]   HPI: Barbara Donovan is a 30 y.o. female with medical history significant of ESRD on PD, diabetes mellitus type I with gastroparesis, and diabetic retinopathy who presents with complaints of nausea and vomiting last 3 days.  She has been unable to keep anything down.  Emesis was noted to have turned to a brown color for which she states that there may have been blood present.  Evaluated in the emergency department yesterday and recommended for admission, but did not want to stay at that time.  After getting home patient reports that she continued to be in [place] to keep any significant amount of food and liquids down.  She has had an intermittent nonproductive cough.  Denies having any significant fever, shortness of breath, abdominal pain, chest pain, or lower extremity swelling.  Last requirement admission for the same in the end of October.  ED Course: Upon admission into the emergency department patient was noted to have a temperature of 99.9 F, pulse 99-118, blood pressures elevated up to 197/102, and O2 saturation currently maintained on room air.  Labs significant for WBC 13.5, platelets 625, CO2 17, BUN 58, creatinine 12.04, glucose 341, anion gap 25, beta hydroxybutyrate 3.18, venous pH 7.462.  Patient had been given labetalol 10 mg IV, 1 L normal saline IV fluids, NovoLog 3 units, and Haldol 5 mg IV.   ROS  Past Medical History:  Diagnosis Date   Asthma    as a child, no problems as an adult, no inhaler   Cataract    NS OU   Chronic hypertension during pregnancy, antepartum 08/19/2017   Dehydration 01/28/2018   Depression during pregnancy, antepartum 07/07/2017   6/20:  Short trial of zoloft previously, reports didn't help much but also didn't give it a chance Discussed r/b/a SSRIs in pregnancy, agrees to try Zoloft again, rx sent No SI/HI/red flags   Diabetes ([place])    TYPE I. A1C 7.5% 05/31/20   Diabetic retinopathy ([place]) 06/09/2017   07/2017 with bilateral severe diabetic non-proliferative retinopathy with macular edema.   ESRD (end stage renal disease) on dialysis ([place])    4 x week  M T TH F   HTN (hypertension)    Hypertensive retinopathy    OU   Hypokalemia 01/22/2018   Hypomagnesemia 01/28/2018   Intractable nausea and vomiting 01/22/2018   Intrauterine growth restriction (IUGR) affecting care of mother 12/22/2017   Morbid obesity ([place])    Nephropathy, diabetic ([place]) 12/29/2017   Peritoneal dialysis status ([place])    Proteinuria due to type 1 diabetes mellitus ([place]) 11/02/2017   Baseline Pr: Cr 10.23   Severe hyperemesis gravidarum 10/30/2017   Type I diabetes mellitus ([place]) 07/07/2017   Current Diabetic Medications:  Insulin  [x]  Aspirin 81 mg daily after 12 weeks (? A2/B GDM)  Required Referrals for A1GDM or A2GDM: [x]  Diabetes Education and Testing Supplies [x]  Nutrition Cousult  For A2/B GDM or higher classes of DM [x]  Diabetes Education and Testing Supplies [x]  Nutrition Counsult [x]  Fetal ECHO after 20 weeks  [x]  Eye exam for retina evaluation - severe retinopathy 7/19  Base   Ventricular septal defect (VSD) of fetus in singleton pregnancy, antepartum 09/30/2017   May go  to newborn nursery per Dr. Lenard Simmer Echo prior to discharge    Past Surgical History:  Procedure Laterality Date   25 GAUGE PARS PLANA VITRECTOMY WITH 20 GAUGE MVR PORT FOR MACULAR HOLE Left 07/20/2018   Procedure: 25 [place] VITRECTOMY LEFT EYE;  Surgeon: Bernarda Caffey, MD;  Location: [place];  Service: Ophthalmology;  Laterality: Left;   CAPD INSERTION N/A 09/10/2020   Procedure: LAPAROSCOPIC INSERTION CONTINUOUS AMBULATORY PERITONEAL DIALYSIS  (CAPD) CATHETER;  Surgeon:  Serafina Mitchell, MD;  Location: [place];  Service: Vascular;  Laterality: N/A;   CESAREAN SECTION N/A 12/26/2017   Procedure: CESAREAN SECTION;  Surgeon: Osborne Oman, MD;  Location: [place];  Service: Obstetrics;  Laterality: N/A;   EYE EXAMINATION UNDER ANESTHESIA Right 07/20/2018   Procedure: Eye Exam Under Anesthesia RIGHT EYE;  Surgeon: Bernarda Caffey, MD;  Location: [facility];  Service: Ophthalmology;  Laterality: Right;   EYE SURGERY Left 07/2018   GAS INSERTION Left 07/19/2019   Procedure: INSERTION OF GAS;  Surgeon: Bernarda Caffey, MD;  Location: [place] [facility];  Service: Ophthalmology;  Laterality: Left;  SF6   INJECTION OF SILICONE OIL Left 01/23/1094   Procedure: Injection Of Silicone Oil LEFT EYE;  Surgeon: Bernarda Caffey, MD;  Location: [place];  Service: Ophthalmology;  Laterality: Left;   IR FLUORO GUIDE CV LINE RIGHT  06/02/2020   IR US GUIDE VASC ACCESS RIGHT  06/02/2020   LASER PHOTO ABLATION Right 07/20/2018   Procedure: Laser Photo Ablation RIGHT EYE;  Surgeon: Bernarda Caffey, MD;  Location: [facility] [facility];  Service: Ophthalmology;  Laterality: Right;   MEMBRANE PEEL Left 07/20/2018   Procedure: Membrane Peel LEFT EYE;  Surgeon: Bernarda Caffey, MD;  Location: [facility];  Service: Ophthalmology;  Laterality: Left;   MEMBRANE PEEL Left 07/19/2019   Procedure: MEMBRANE PEEL;  Surgeon: Bernarda Caffey, MD;  Location: [place];  Service: Ophthalmology;  Laterality: Left;   MITOMYCIN C APPLICATION Bilateral 0/04/5407   Procedure: Avastin Application;  Surgeon: Bernarda Caffey, MD;  Location: [place];  Service: Ophthalmology;  Laterality: Bilateral;   PHOTOCOAGULATION WITH LASER Left 07/20/2018   Procedure: Photocoagulation With Laser LEFT EYE;  Surgeon: Bernarda Caffey, MD;  Location: [place];  Service: Ophthalmology;  Laterality: Left;   PHOTOCOAGULATION WITH LASER Left 07/19/2019   Procedure: PHOTOCOAGULATION WITH LASER;  Surgeon: Bernarda Caffey, MD;  Location: [facility];  Service: Ophthalmology;  Laterality: Left;   RETINAL  DETACHMENT SURGERY Left 07/20/2018   Dr. Bernarda Caffey   SILICON OIL REMOVAL Left 07/19/2019   Procedure: 25g PARS PLANA VITRECTOMY WITH SILICON OIL REMOVAL;  Surgeon: Bernarda Caffey, MD;  Location: [facility];  Service: Ophthalmology;  Laterality: Left;   WISDOM TOOTH EXTRACTION       reports that she has never smoked. She has never used smokeless tobacco. She reports that she does not currently use alcohol. She reports that she does not use drugs.  No Known Allergies  Family History  Problem Relation Age of Onset   Diabetes Mother    Aneurysm Mother 63   Seizures Mother    Diabetes Father    Cataracts Father    COPD Father    Heart attack Father    Heart disease Father    Healthy Sister    Healthy Daughter    Stroke Maternal Grandfather    Amblyopia Neg Hx    Blindness Neg Hx    Glaucoma Neg Hx    Macular degeneration Neg Hx    Retinal detachment Neg  Hx    Strabismus Neg Hx    Retinitis pigmentosa Neg Hx    Colon cancer Neg Hx    Stomach cancer Neg Hx    Esophageal cancer Neg Hx    Pancreatic cancer Neg Hx    Liver disease Neg Hx     Prior to Admission medications   Medication Sig Start Date End Date Taking? Authorizing Provider  acetaminophen (TYLENOL) 500 MG tablet Take 1,000 mg by mouth every 6 (six) hours as needed for mild pain.    [provider]  amLODipine (NORVASC) 10 MG tablet Take 1 tablet (10 mg total) by mouth daily. 03/09/20   Mercy Riding, MD  calcitRIOL (ROCALTROL) 0.25 MCG capsule Take 0.25 mcg by mouth daily. 12/02/20   [provider]  carvedilol (COREG) 25 MG tablet Take 1 tablet (25 mg total) by mouth 2 (two) times daily. 03/07/19   Imogene Burn, PA-C  Continuous Blood Gluc Sensor MISC 1 each by Does not apply route as directed. Use as directed every 14 days. May dispense FreeStyle [employer] or similar. 01/27/20   Antonieta Pert, MD  escitalopram (LEXAPRO) 5 MG tablet Take 5 mg by mouth daily. 12/22/20   [provider]   gentamicin cream (GARAMYCIN) 0.1 % Apply 1 application topically daily. 09/10/20   [provider]  glucose blood (ONETOUCH VERIO) test strip 1 each by Other route 3 (three) times daily. And lancets 3/day 11/28/20   Renato Shin, MD  hydrALAZINE (APRESOLINE) 50 MG tablet Take 2 tablets (100 mg total) by mouth 3 (three) times daily. Patient taking differently: Take 50 mg by mouth 3 (three) times daily. 06/09/20   Geradine Girt, DO  insulin glargine (LANTUS SOLOSTAR) 100 UNIT/ML Solostar Pen Inject 20 Units into the skin every morning. 11/13/20   Thurnell Lose, MD  insulin lispro (HUMALOG KWIKPEN) 100 UNIT/ML KwikPen Inject 3 Units into the skin 3 (three) times daily with meals. 11/28/20   Renato Shin, MD  losartan (COZAAR) 50 MG tablet Take 2 tablets (100 mg total) by mouth daily. 06/09/20   Geradine Girt, DO  metoCLOPramide (REGLAN) 5 MG tablet Take 1 tablet (5 mg total) by mouth 4 (four) times daily -  before meals and at bedtime. 08/28/20   Levin Erp, PA  multivitamin (RENA-VIT) TABS tablet Take 1 tablet by mouth at bedtime. 03/08/20   Mercy Riding, MD  ondansetron (ZOFRAN ODT) 4 MG disintegrating tablet Take 1 tablet (4 mg total) by mouth every 8 (eight) hours as needed for nausea or vomiting. 11/13/20   Thurnell Lose, MD  promethazine (PHENERGAN) 25 MG suppository Place 1 suppository (25 mg total) rectally every 6 (six) hours as needed for nausea or vomiting. 11/25/20   Charlann Lange, PA-C    Physical Exam:  Constitutional: Young female who appears not to be feeling well Vitals:   12/28/20 1015 12/28/20 1030 12/28/20 1045 12/28/20 1100  BP: (!) 187/113 (!) 174/100 (!) 162/94 (!) 197/119  Pulse: 99 94 98 95  Resp: 18 18 16 17   Temp:      TempSrc:      SpO2: 98% 99% 98% 98%  Weight:      Height:       Eyes: PERRL, lids and conjunctivae normal ENMT: Mucous membranes are dry. posterior pharynx clear of any exudate or lesions.  Neck: normal, supple, no  masses, no thyromegaly Respiratory: Normal respiratory effort without significant wheezes or rhonchi appreciated Cardiovascular: Regular rate and  rhythm, no murmurs / rubs / gallops. No extremity edema. 2+ pedal pulses. No carotid bruits.  Abdomen: Soft, no tenderness, no masses palpated. No hepatosplenomegaly. Bowel sounds positive.  Peritoneal dialysis catheter in place. Musculoskeletal: no clubbing / cyanosis. No joint deformity upper and lower extremities. Good ROM, no contractures. Normal muscle tone.  Skin: no rashes, lesions, ulcers. No induration Neurologic: CN 2-12 grossly intact. Sensation intact, DTR normal. Strength 5/5 in all 4.  Psychiatric: Normal judgment and insight.  Sleepy, but easily arousable.oriented x3.    Labs on Admission: I have personally reviewed following labs and imaging studies  CBC: Recent Labs  Lab 12/27/20 1449 12/27/20 1736 12/28/20 0651 12/28/20 0956  WBC 11.4*  --  13.5*  --   NEUTROABS 9.2*  --   --   --   HGB 12.3 13.3 12.2 13.6  HCT 38.2 39.0 37.9 40.0  MCV 88.6  --  88.6  --   PLT 649*  --  625*  --    Basic Metabolic Panel: Recent Labs  Lab 12/27/20 1449 12/27/20 1736 12/27/20 1942 12/28/20 0651 12/28/20 0956  NA 142 140 139 135 135  K 4.1 4.4 3.9 3.9 3.9  CL 102  --  101 93*  --   CO2 18*  --  18* 17*  --   GLUCOSE 206*  --  275* 341*  --   BUN 50*  --  54* 58*  --   CREATININE 11.07*  --  11.46* 12.04*  --   CALCIUM 10.1  --  9.2 9.6  --    GFR: Estimated Creatinine Clearance: 6.4 mL/min (A) (by C-G formula based on SCr of 12.04 mg/dL (H)). Liver Function Tests: Recent Labs  Lab 12/27/20 1449 12/28/20 0651  AST 16 21  ALT 12 17  ALKPHOS 85 81  BILITOT 0.8 1.0  PROT 8.3* 7.9  ALBUMIN 4.0 3.8   Recent Labs  Lab 12/27/20 1449 12/28/20 0651  LIPASE 45 42   No results for input(s): AMMONIA in the last 168 hours. Coagulation Profile: No results for input(s): INR, PROTIME in the last 168 hours. Cardiac  Enzymes: No results for input(s): CKTOTAL, CKMB, CKMBINDEX, TROPONINI in the last 168 hours. BNP (last 3 results) No results for input(s): PROBNP in the last 8760 hours. HbA1C: No results for input(s): HGBA1C in the last 72 hours. CBG: Recent Labs  Lab 12/27/20 1458 12/27/20 2254 12/28/20 0949  GLUCAP 187* 226* 330*   Lipid Profile: No results for input(s): CHOL, HDL, LDLCALC, TRIG, CHOLHDL, LDLDIRECT in the last 72 hours. Thyroid Function Tests: No results for input(s): TSH, T4TOTAL, FREET4, T3FREE, THYROIDAB in the last 72 hours. Anemia Panel: No results for input(s): VITAMINB12, FOLATE, FERRITIN, TIBC, IRON, RETICCTPCT in the last 72 hours. Urine analysis:    Component Value Date/Time   COLORURINE YELLOW 11/25/2020 0440   APPEARANCEUR HAZY (A) 11/25/2020 0440   LABSPEC 1.012 11/25/2020 0440   PHURINE 7.0 11/25/2020 0440   GLUCOSEU >=500 (A) 11/25/2020 0440   HGBUR NEGATIVE 11/25/2020 0440   BILIRUBINUR NEGATIVE 11/25/2020 0440   KETONESUR 5 (A) 11/25/2020 0440   PROTEINUR 100 (A) 11/25/2020 0440   UROBILINOGEN 0.2 08/14/2019 1051   NITRITE NEGATIVE 11/25/2020 0440   LEUKOCYTESUR NEGATIVE 11/25/2020 0440   Sepsis Labs: Recent Results (from the past 240 hour(s))  Resp Panel by RT-PCR (Flu A&B, Covid) Nasopharyngeal Swab     Status: None   Collection Time: 12/27/20  6:01 PM   Specimen: Nasopharyngeal Swab; Nasopharyngeal(NP) swabs in  vial transport medium  Result Value Ref Range Status   SARS Coronavirus 2 by RT PCR NEGATIVE NEGATIVE Final    Comment: (NOTE) SARS-CoV-2 target nucleic acids are NOT DETECTED.  The SARS-CoV-2 RNA is generally detectable in upper respiratory specimens during the acute phase of infection. The lowest concentration of SARS-CoV-2 viral copies this assay can detect is 138 copies/mL. A negative result does not preclude SARS-Cov-2 infection and should not be used as the sole basis for treatment or other patient management decisions. A negative  result may occur with  improper specimen collection/handling, submission of specimen other than nasopharyngeal swab, presence of viral mutation(s) within the areas targeted by this assay, and inadequate number of viral copies(<138 copies/mL). A negative result must be combined with clinical observations, patient history, and epidemiological information. The expected result is Negative.  Fact Sheet for Patients:  EntrepreneurPulse.com.au  Fact Sheet for Healthcare Providers:  IncredibleEmployment.be  This test is no t yet approved or cleared by the [place] FDA and  has been authorized for detection and/or diagnosis of SARS-CoV-2 by FDA under an Emergency Use Authorization (EUA). This EUA will remain  in effect (meaning this test can be used) for the duration of the COVID-19 declaration under Section 564(b)(1) of the Act, 21 U.S.C.section 360bbb-3(b)(1), unless the authorization is terminated  or revoked sooner.       Influenza A by PCR NEGATIVE NEGATIVE Final   Influenza B by PCR NEGATIVE NEGATIVE Final    Comment: (NOTE) The Xpert Xpress SARS-CoV-2/FLU/RSV plus assay is intended as an aid in the diagnosis of influenza from Nasopharyngeal swab specimens and should not be used as a sole basis for treatment. Nasal washings and aspirates are unacceptable for Xpert Xpress SARS-CoV-2/FLU/RSV testing.  Fact Sheet for Patients: EntrepreneurPulse.com.au  Fact Sheet for Healthcare Providers: IncredibleEmployment.be  This test is not yet approved or cleared by the [place] FDA and has been authorized for detection and/or diagnosis of SARS-CoV-2 by FDA under an Emergency Use Authorization (EUA). This EUA will remain in effect (meaning this test can be used) for the duration of the COVID-19 declaration under Section 564(b)(1) of the Act, 21 U.S.C. section 360bbb-3(b)(1), unless the authorization is  terminated or revoked.  Performed at [facility], [place] [address], [place], [place] [address]      Radiological Exams on Admission: No results found.  EKG: Independently reviewed.  Sinus rhythm at 91 bpm with borderline QT prolongation.  Assessment/Plan Nausea and vomiting suspect secondary to diabetic gastroparesis: Acute on chronic.  Patient presents with complaints of intractable nausea and vomiting.  Hemoglobin within normal limits.  Reports emesis had turned brown in question possibility of blood. -Admit to a telemetry bed -N.p.o. except for meds if able to and advanced as tolerated to renal and carb modified diet once able -Check gastric occult -Schedule Reglan 5 mg IV every 8 hours -Normal saline IV fluids at 75 mL/h -Phenergan IV as needed -Protonix IV  SIRS leukocytosis: Acute.  Patient was noted to be tachycardic and tachypneic with temperature initially noted to be 99.5 F. -Check blood cultures -Check chest x-ray and urinalysis(urinalysis was positive for glucose and ketones but negative for signs of infection) -Check peritoneal fluid gram stain and culture  DKA type I metabolic acidosis with elevated anion gap: Acute.  Lab significant for CO2 17 with anion gap of 25, glucose 341, and beta hydroxybutyrate 3.18.   Suspect possibly mild DKA although venous pH 7.462. -Continue pharmacy substitution of Semglee 20 units -  CBGs before every meal with very sensitive SSI -Serial monitoring of BMP and consider transitioning to insulin drip if needed  Hypertensive urgency: On admission blood pressures elevated up to 197/102.  Patient had been given 10 mg of labetalol IV.  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

## 2020-12-28 NOTE — ED Provider Notes (Addendum)
[facility]PART[facility] Provider Note   CSN: [id number] Arrival date & time: 12/28/20  4235     History Chief Complaint  Patient presents with   Nausea   Emesis    Barbara Donovan is a 30 y.o. female with a past medical history of type 1 diabetes, gastroparesis, end-stage renal disease on nightly peritoneal dialysis presenting today with a complaint of nausea and vomiting.  Patient was evaluated yesterday for the same however felt as though she was stable enough to go home and was discharged.  She returns today because her symptoms have not improved and may have worsened.  Feels exactly like previous bouts of gastroparesis.  She also was unable to do peritoneal dialysis due to feeling so ill last night and has not had any insulin since her evaluation in the emergency department.  Reports her blood glucoses have been up and down at home.  No longer gets menstrual periods due to her chronic illnesses.  States that none of the medications that she got yesterday helped her.  Has not been able to take her hypertension medications due to limited p.o. intake.      Past Medical History:  Diagnosis Date   Asthma    as a child, no problems as an adult, no inhaler   Cataract    NS OU   Chronic hypertension during pregnancy, antepartum 08/19/2017   Dehydration 01/28/2018   Depression during pregnancy, antepartum 07/07/2017   6/20: Short trial of zoloft previously, reports didn't help much but also didn't give it a chance Discussed r/b/a SSRIs in pregnancy, agrees to try Zoloft again, rx sent No SI/HI/red flags   Diabetes ([place])    TYPE I. A1C 7.5% 05/31/20   Diabetic retinopathy ([place]) 06/09/2017   07/2017 with bilateral severe diabetic non-proliferative retinopathy with macular edema.   ESRD (end stage renal disease) on dialysis ([place])    4 x week  M T TH F   HTN (hypertension)    Hypertensive retinopathy    OU   Hypokalemia 01/22/2018   Hypomagnesemia 01/28/2018    Intractable nausea and vomiting 01/22/2018   Intrauterine growth restriction (IUGR) affecting care of mother 12/22/2017   Morbid obesity ([place])    Nephropathy, diabetic ([place]) 12/29/2017   Peritoneal dialysis status ([place])    Proteinuria due to type 1 diabetes mellitus ([place]) 11/02/2017   Baseline Pr: Cr 10.23   Severe hyperemesis gravidarum 10/30/2017   Type I diabetes mellitus ([place]) 07/07/2017   Current Diabetic Medications:  Insulin  [x]  Aspirin 81 mg daily after 12 weeks (? A2/B GDM)  Required Referrals for A1GDM or A2GDM: [x]  Diabetes Education and Testing Supplies [x]  Nutrition Cousult  For A2/B GDM or higher classes of DM [x]  Diabetes Education and Testing Supplies [x]  Nutrition Counsult [x]  Fetal ECHO after 20 weeks  [x]  Eye exam for retina evaluation - severe retinopathy 7/19  Base   Ventricular septal defect (VSD) of fetus in singleton pregnancy, antepartum 09/30/2017   May go to newborn nursery per Dr. Lenard Simmer Echo prior to discharge    Patient Active Problem List   Diagnosis Date Noted   DKA, type 1 ([place]) 11/10/2020   Type 1 diabetes mellitus with hyperglycemia ([place]) 11/10/2020   ESRD (end stage renal disease) ([place]) 11/10/2020   Renal failure (ARF), acute on chronic ([place]) 06/01/2020   ARF (acute renal failure) ([facility])    Nausea & vomiting    Diabetic gastroparesis ([place]) 03/04/2020   Anemia due to stage 5  chronic kidney disease ([place]) 03/04/2020   Diabetic ketoacidosis without coma associated with type 1 diabetes mellitus ([place]) 02/09/2020   Hypothermia 01/24/2020   DKA (diabetic ketoacidosis) ([place]) 09/14/2019   COVID-19 virus infection 09/14/2019   SOB (shortness of breath) 03/27/2018   Hypomagnesemia 01/28/2018   Dehydration 01/28/2018   Intractable nausea and vomiting 01/22/2018   Hypertensive urgency 01/22/2018   Hypokalemia 01/22/2018   Nephropathy, diabetic ([place]) 12/29/2017   Proteinuria due to type 1 diabetes mellitus ([place]) 11/02/2017   Chronic hypertension 08/21/2017    Uncontrolled type 1 diabetes mellitus with stage 5 chronic kidney disease not on chronic dialysis, with long-term current use of insulin 07/07/2017   Diabetic retinopathy ([place]) 06/09/2017   Acute depression 11/02/2014   Asthma 10/30/2013    Past Surgical History:  Procedure Laterality Date   25 GAUGE PARS PLANA VITRECTOMY WITH 20 GAUGE MVR PORT FOR MACULAR HOLE Left 07/20/2018   Procedure: 25 GAUGE PARS PLANA VITRECTOMY LEFT EYE;  Surgeon: Bernarda Caffey, MD;  Location: [facility];  Service: Ophthalmology;  Laterality: Left;   CAPD INSERTION N/A 09/10/2020   Procedure: LAPAROSCOPIC INSERTION CONTINUOUS AMBULATORY PERITONEAL DIALYSIS  (CAPD) CATHETER;  Surgeon: Serafina Mitchell, MD;  Location: [facility];  Service: Vascular;  Laterality: N/A;   CESAREAN SECTION N/A 12/26/2017   Procedure: CESAREAN SECTION;  Surgeon: Osborne Oman, MD;  Location: [facility];  Service: Obstetrics;  Laterality: N/A;   EYE EXAMINATION UNDER ANESTHESIA Right 07/20/2018   Procedure: Eye Exam Under Anesthesia RIGHT EYE;  Surgeon: Bernarda Caffey, MD;  Location: [facility];  Service: Ophthalmology;  Laterality: Right;   EYE SURGERY Left 07/2018   GAS INSERTION Left 07/19/2019   Procedure: INSERTION OF GAS;  Surgeon: Bernarda Caffey, MD;  Location: [facility];  Service: Ophthalmology;  Laterality: Left;  SF6   INJECTION OF SILICONE OIL Left 03/20/2949   Procedure: Injection Of Silicone Oil LEFT EYE;  Surgeon: Bernarda Caffey, MD;  Location: [facility];  Service: Ophthalmology;  Laterality: Left;   IR FLUORO GUIDE CV LINE RIGHT  06/02/2020   IR US GUIDE VASC ACCESS RIGHT  06/02/2020   LASER PHOTO ABLATION Right 07/20/2018   Procedure: Laser Photo Ablation RIGHT EYE;  Surgeon: Bernarda Caffey, MD;  Location: [facility];  Service: Ophthalmology;  Laterality: Right;   MEMBRANE PEEL Left 07/20/2018   Procedure: Membrane Peel LEFT EYE;  Surgeon: Bernarda Caffey, MD;  Location: [facility];  Service: Ophthalmology;  Laterality: Left;   MEMBRANE PEEL Left 07/19/2019    Procedure: MEMBRANE PEEL;  Surgeon: Bernarda Caffey, MD;  Location: [facility];  Service: Ophthalmology;  Laterality: Left;   MITOMYCIN C APPLICATION Bilateral 08/25/4164   Procedure: Avastin Application;  Surgeon: Bernarda Caffey, MD;  Location: [facility];  Service: Ophthalmology;  Laterality: Bilateral;   PHOTOCOAGULATION WITH LASER Left 07/20/2018   Procedure: Photocoagulation With Laser LEFT EYE;  Surgeon: Bernarda Caffey, MD;  Location: [facility];  Service: Ophthalmology;  Laterality: Left;   PHOTOCOAGULATION WITH LASER Left 07/19/2019   Procedure: PHOTOCOAGULATION WITH LASER;  Surgeon: Bernarda Caffey, MD;  Location: [facility];  Service: Ophthalmology;  Laterality: Left;   RETINAL DETACHMENT SURGERY Left 07/20/2018   Dr. Bernarda Caffey   SILICON OIL REMOVAL Left 07/19/2019   Procedure: 25g PARS PLANA VITRECTOMY WITH SILICON OIL REMOVAL;  Surgeon: Bernarda Caffey, MD;  Location: [facility];  Service: Ophthalmology;  Laterality: Left;   WISDOM TOOTH EXTRACTION       OB History     Gravida  1   Para  1   Term      Preterm  1   AB      Living  1      SAB      IAB      Ectopic      Multiple  0   Live Births  1           Family History  Problem Relation Age of Onset   Diabetes Mother    Aneurysm Mother 67   Seizures Mother    Diabetes Father    Cataracts Father    COPD Father    Heart attack Father    Heart disease Father    Healthy Sister    Healthy Daughter    Stroke Maternal Grandfather    Amblyopia Neg Hx    Blindness Neg Hx    Glaucoma Neg Hx    Macular degeneration Neg Hx    Retinal detachment Neg Hx    Strabismus Neg Hx    Retinitis pigmentosa Neg Hx    Colon cancer Neg Hx    Stomach cancer Neg Hx    Esophageal cancer Neg Hx    Pancreatic cancer Neg Hx    Liver disease Neg Hx     Social History   Tobacco Use   Smoking status: Never   Smokeless tobacco: Never  Vaping Use   Vaping Use: Never used  Substance Use Topics   Alcohol use: Not Currently   Drug use: Never     Home Medications Prior to Admission medications   Medication Sig Start Date End Date Taking? Authorizing Provider  acetaminophen (TYLENOL) 500 MG tablet Take 1,000 mg by mouth every 6 (six) hours as needed for mild pain.    [provider]  amLODipine (NORVASC) 10 MG tablet Take 1 tablet (10 mg total) by mouth daily. 03/09/20   Mercy Riding, MD  calcitRIOL (ROCALTROL) 0.25 MCG capsule Take 0.25 mcg by mouth daily. 12/02/20   [provider]  carvedilol (COREG) 25 MG tablet Take 1 tablet (25 mg total) by mouth 2 (two) times daily. 03/07/19   Imogene Burn, PA-C  Continuous Blood Gluc Sensor MISC 1 each by Does not apply route as directed. Use as directed every 14 days. May dispense FreeStyle [employer] or similar. 01/27/20   Antonieta Pert, MD  escitalopram (LEXAPRO) 5 MG tablet Take 5 mg by mouth daily. 12/22/20   [provider]  gentamicin cream (GARAMYCIN) 0.1 % Apply 1 application topically daily. 09/10/20   [provider]  glucose blood (ONETOUCH VERIO) test strip 1 each by Other route 3 (three) times daily. And lancets 3/day 11/28/20   Renato Shin, MD  hydrALAZINE (APRESOLINE) 50 MG tablet Take 2 tablets (100 mg total) by mouth 3 (three) times daily. Patient taking differently: Take 50 mg by mouth 3 (three) times daily. 06/09/20   Geradine Girt, DO  insulin glargine (LANTUS SOLOSTAR) 100 UNIT/ML Solostar Pen Inject 20 Units into the skin every morning. 11/13/20   Thurnell Lose, MD  insulin lispro (HUMALOG KWIKPEN) 100 UNIT/ML KwikPen Inject 3 Units into the skin 3 (three) times daily with meals. 11/28/20   Renato Shin, MD  losartan (COZAAR) 50 MG tablet Take 2 tablets (100 mg total) by mouth daily. 06/09/20   Geradine Girt, DO  metoCLOPramide (REGLAN) 5 MG tablet Take 1 tablet (5 mg total) by mouth 4 (four) times daily -  before meals and at bedtime. 08/28/20   Ellouise Newer  Sula Soda, PA  multivitamin (RENA-VIT) TABS tablet Take 1 tablet  by mouth at bedtime. 03/08/20   Mercy Riding, MD  ondansetron (ZOFRAN ODT) 4 MG disintegrating tablet Take 1 tablet (4 mg total) by mouth every 8 (eight) hours as needed for nausea or vomiting. 11/13/20   Thurnell Lose, MD  promethazine (PHENERGAN) 25 MG suppository Place 1 suppository (25 mg total) rectally every 6 (six) hours as needed for nausea or vomiting. 11/25/20   Charlann Lange, PA-C    Allergies    Patient has no known allergies.  Review of Systems   Review of Systems  Respiratory:  Negative for shortness of breath.   Cardiovascular:  Negative for chest pain.  Gastrointestinal:  Positive for nausea and vomiting. Negative for abdominal pain.  Genitourinary:  Negative for dysuria.   Physical Exam Updated Vital Signs BP (!) 192/126 (BP Location: Right Arm)   Pulse (!) 111   Temp 98.3 F (36.8 C) (Oral)   Resp 18   Ht 5\' 6"  (1.676 m)   Wt 65.8 kg   SpO2 99%   BMI 23.40 kg/m   Physical Exam Vitals and nursing note reviewed.  Constitutional:      Appearance: Normal appearance. She is ill-appearing and diaphoretic.  HENT:     Head: Normocephalic and atraumatic.  Eyes:     General: No scleral icterus.    Conjunctiva/sclera: Conjunctivae normal.  Cardiovascular:     Rate and Rhythm: Normal rate and regular rhythm.  Pulmonary:     Effort: Pulmonary effort is normal. No respiratory distress.  Abdominal:     General: Abdomen is flat.     Palpations: Abdomen is soft.     Tenderness: There is no abdominal tenderness.  Skin:    Findings: No rash.  Neurological:     Mental Status: She is alert.  Psychiatric:        Mood and Affect: Mood normal.    ED Results / Procedures / Treatments   Labs (all labs ordered are listed, but only abnormal results are displayed) Labs Reviewed  COMPREHENSIVE METABOLIC PANEL - Abnormal; Notable for the following components:      Result Value   Chloride 93 (*)    CO2 17 (*)    Glucose, Bld 341 (*)    BUN 58 (*)    Creatinine,  Ser 12.04 (*)    GFR, Estimated 4 (*)    Anion gap 25 (*)    All other components within normal limits  CBC - Abnormal; Notable for the following components:   WBC 13.5 (*)    Platelets 625 (*)    All other components within normal limits  BETA-HYDROXYBUTYRIC ACID - Abnormal; Notable for the following components:   Beta-Hydroxybutyric Acid 3.18 (*)    All other components within normal limits  CBG MONITORING, ED - Abnormal; Notable for the following components:   Glucose-Capillary 330 (*)    All other components within normal limits  I-STAT VENOUS BLOOD GAS, ED - Abnormal; Notable for the following components:   pH, Ven 7.462 (*)    pCO2, Ven 30.4 (*)    pO2, Ven 107.0 (*)    Calcium, Ion 0.98 (*)    All other components within normal limits  LIPASE, BLOOD  URINALYSIS, ROUTINE W REFLEX MICROSCOPIC  I-STAT BETA HCG BLOOD, ED ([facility], [facility], [facility] ONLY)    EKG None  Radiology No results found.  Procedures Procedures   Medications Ordered in ED Medications  insulin aspart (novoLOG) injection  3 Units (has no administration in time range)  sodium chloride 0.9 % bolus 1,000 mL (1,000 mLs Intravenous New Bag/Given 12/28/20 0945)  labetalol (NORMODYNE) injection 10 mg (10 mg Intravenous Given 12/28/20 1102)  haloperidol lactate (HALDOL) injection 5 mg (5 mg Intravenous Given 12/28/20 1104)    ED Course  I have reviewed the triage vital signs and the nursing notes.  Pertinent labs & imaging results that were available during my care of the patient were reviewed by me and considered in my medical decision making (see chart for details).    MDM Rules/Calculators/A&P 30 year old female with past medical history of type 1 diabetes, gastroparesis and end-stage renal disease with nausea and vomiting.  Missed dialysis and insulin doses yesterday.  Was evaluated in the ED yesterday, encouraged to stay however she felt as though she could go home.  Back today with worsening symptoms.  Seen by  both myself and Dr. Maryan Rued.  Beta hydroxybutyrate 3.18, up from 1.2 yesterday.  Anion gap 25, up from 20 yesterday.  Does not appear fluid overloaded and given a fluid bolus.  Blood glucose  226.  Given home dose of insulin.  Blood pressure elevated to 190s over 120s.  Associated tachycardia to the 110s.  Given labetalol.  We will give oral home doses if patient's nausea is under control.  Given Haldol at this time for gastroparesis.  Patient is unable to tolerate p.o. intake.  Hospitalist paged at this time.  Patient to be admitted for intractable NV. Dr. Tamala Julian accepts the patient for admission. MD Carolin Sicks with Nephrology to follow.  Final Clinical Impression(s) / ED Diagnoses Final diagnoses:  Intractable nausea and vomiting    Rx / DC Orders Admit to hospitalist    Rhae Hammock, PA-C 12/28/20 1134    Blanchie Dessert, MD 12/28/20 1459

## 2020-12-28 NOTE — ED Triage Notes (Signed)
Pt arrives via [facility] c/o N/V. Pt is a nightly peritoneal dialysis pt who was unable to do last night's tx due to feeling unwell. Pt seen in ED for same yesterday.   #22 L FA by EMS with 250 mL NS BOLUS and 4 mg IV Zofran.   EMS last VS - 176/110, HR 110, 98% on RA, CBG 353,

## 2020-12-29 ENCOUNTER — Encounter (HOSPITAL_COMMUNITY): Payer: Self-pay | Admitting: Internal Medicine

## 2020-12-29 ENCOUNTER — Other Ambulatory Visit (HOSPITAL_COMMUNITY): Payer: Self-pay

## 2020-12-29 DIAGNOSIS — Z20822 Contact with and (suspected) exposure to covid-19: Secondary | ICD-10-CM | POA: Diagnosis present

## 2020-12-29 DIAGNOSIS — I120 Hypertensive chronic kidney disease with stage 5 chronic kidney disease or end stage renal disease: Secondary | ICD-10-CM | POA: Diagnosis present

## 2020-12-29 DIAGNOSIS — E1010 Type 1 diabetes mellitus with ketoacidosis without coma: Secondary | ICD-10-CM | POA: Diagnosis present

## 2020-12-29 DIAGNOSIS — Z79899 Other long term (current) drug therapy: Secondary | ICD-10-CM | POA: Diagnosis not present

## 2020-12-29 DIAGNOSIS — Z794 Long term (current) use of insulin: Secondary | ICD-10-CM | POA: Diagnosis not present

## 2020-12-29 DIAGNOSIS — I160 Hypertensive urgency: Secondary | ICD-10-CM | POA: Diagnosis present

## 2020-12-29 DIAGNOSIS — R059 Cough, unspecified: Secondary | ICD-10-CM | POA: Diagnosis present

## 2020-12-29 DIAGNOSIS — N186 End stage renal disease: Secondary | ICD-10-CM | POA: Diagnosis present

## 2020-12-29 DIAGNOSIS — E1022 Type 1 diabetes mellitus with diabetic chronic kidney disease: Secondary | ICD-10-CM | POA: Diagnosis present

## 2020-12-29 DIAGNOSIS — E10319 Type 1 diabetes mellitus with unspecified diabetic retinopathy without macular edema: Secondary | ICD-10-CM | POA: Diagnosis present

## 2020-12-29 DIAGNOSIS — R6510 Systemic inflammatory response syndrome (SIRS) of non-infectious origin without acute organ dysfunction: Secondary | ICD-10-CM | POA: Diagnosis present

## 2020-12-29 DIAGNOSIS — E1043 Type 1 diabetes mellitus with diabetic autonomic (poly)neuropathy: Secondary | ICD-10-CM | POA: Diagnosis present

## 2020-12-29 DIAGNOSIS — R112 Nausea with vomiting, unspecified: Secondary | ICD-10-CM | POA: Diagnosis present

## 2020-12-29 DIAGNOSIS — M898X9 Other specified disorders of bone, unspecified site: Secondary | ICD-10-CM | POA: Diagnosis present

## 2020-12-29 DIAGNOSIS — N2581 Secondary hyperparathyroidism of renal origin: Secondary | ICD-10-CM | POA: Diagnosis present

## 2020-12-29 DIAGNOSIS — D649 Anemia, unspecified: Secondary | ICD-10-CM | POA: Diagnosis present

## 2020-12-29 DIAGNOSIS — K3184 Gastroparesis: Secondary | ICD-10-CM | POA: Diagnosis present

## 2020-12-29 DIAGNOSIS — Z8249 Family history of ischemic heart disease and other diseases of the circulatory system: Secondary | ICD-10-CM | POA: Diagnosis not present

## 2020-12-29 DIAGNOSIS — Z825 Family history of asthma and other chronic lower respiratory diseases: Secondary | ICD-10-CM | POA: Diagnosis not present

## 2020-12-29 DIAGNOSIS — Z833 Family history of diabetes mellitus: Secondary | ICD-10-CM | POA: Diagnosis not present

## 2020-12-29 DIAGNOSIS — Z992 Dependence on renal dialysis: Secondary | ICD-10-CM | POA: Diagnosis not present

## 2020-12-29 DIAGNOSIS — J45909 Unspecified asthma, uncomplicated: Secondary | ICD-10-CM | POA: Diagnosis present

## 2020-12-29 LAB — BODY FLUID CELL COUNT WITH DIFFERENTIAL
Eos, Fluid: 0 %
Lymphs, Fluid: 12 %
Monocyte-Macrophage-Serous Fluid: 84 % (ref 50–90)
Neutrophil Count, Fluid: 4 % (ref 0–25)
Total Nucleated Cell Count, Fluid: 18 cu mm (ref 0–1000)

## 2020-12-29 LAB — RENAL FUNCTION PANEL
Albumin: 3.1 g/dL — ABNORMAL LOW (ref 3.5–5.0)
Anion gap: 17 — ABNORMAL HIGH (ref 5–15)
BUN: 60 mg/dL — ABNORMAL HIGH (ref 6–20)
CO2: 22 mmol/L (ref 22–32)
Calcium: 9.1 mg/dL (ref 8.9–10.3)
Chloride: 97 mmol/L — ABNORMAL LOW (ref 98–111)
Creatinine, Ser: 11.61 mg/dL — ABNORMAL HIGH (ref 0.44–1.00)
GFR, Estimated: 4 mL/min — ABNORMAL LOW (ref >60)
Glucose, Bld: 233 mg/dL — ABNORMAL HIGH (ref 70–99)
Phosphorus: 9.3 mg/dL — ABNORMAL HIGH (ref 2.5–4.6)
Potassium: 3.9 mmol/L (ref 3.5–5.1)
Sodium: 136 mmol/L (ref 135–145)

## 2020-12-29 LAB — BASIC METABOLIC PANEL
Anion gap: 18 — ABNORMAL HIGH (ref 5–15)
BUN: 62 mg/dL — ABNORMAL HIGH (ref 6–20)
CO2: 22 mmol/L (ref 22–32)
Calcium: 9 mg/dL (ref 8.9–10.3)
Chloride: 97 mmol/L — ABNORMAL LOW (ref 98–111)
Creatinine, Ser: 11.63 mg/dL — ABNORMAL HIGH (ref 0.44–1.00)
GFR, Estimated: 4 mL/min — ABNORMAL LOW (ref >60)
Glucose, Bld: 232 mg/dL — ABNORMAL HIGH (ref 70–99)
Potassium: 4 mmol/L (ref 3.5–5.1)
Sodium: 137 mmol/L (ref 135–145)

## 2020-12-29 LAB — GLUCOSE, CAPILLARY
Glucose-Capillary: 296 mg/dL — ABNORMAL HIGH (ref 70–99)
Glucose-Capillary: 259 mg/dL — ABNORMAL HIGH (ref 70–99)
Glucose-Capillary: 158 mg/dL — ABNORMAL HIGH (ref 70–99)
Glucose-Capillary: 253 mg/dL — ABNORMAL HIGH (ref 70–99)

## 2020-12-29 LAB — GRAM STAIN

## 2020-12-29 LAB — CBG MONITORING, ED: Glucose-Capillary: 323 mg/dL — ABNORMAL HIGH (ref 70–99)

## 2020-12-29 MED ORDER — DELFLEX-LC/1.5% DEXTROSE 344 MOSM/L IP SOLN: INTRAPERITONEAL | Status: DC

## 2020-12-29 MED ORDER — INSULIN ASPART 100 UNIT/ML IJ SOLN
2.0000 [IU] | Freq: Three times a day (TID) | INTRAMUSCULAR | Status: DC
  Administered 2020-12-29 (×2): 2 [IU] via SUBCUTANEOUS

## 2020-12-29 MED ORDER — CHLORHEXIDINE GLUCONATE CLOTH 2 % EX PADS
6.0000 | MEDICATED_PAD | Freq: Every day | CUTANEOUS | Status: DC
  Administered 2020-12-29 – 2020-12-30 (×2): 6 via TOPICAL

## 2020-12-29 MED ORDER — HEPARIN 1000 UNIT/ML FOR PERITONEAL DIALYSIS
INTRAPERITONEAL | Status: DC | PRN
  Filled 2020-12-29: qty 5000

## 2020-12-29 MED ORDER — METOCLOPRAMIDE HCL 5 MG/ML IJ SOLN
5.0000 mg | Freq: Three times a day (TID) | INTRAMUSCULAR | Status: DC
  Administered 2020-12-29 – 2020-12-31 (×6): 5 mg via INTRAVENOUS
  Filled 2020-12-29 (×6): qty 2

## 2020-12-29 MED ORDER — DELFLEX-LC/2.5% DEXTROSE 394 MOSM/L IP SOLN
INTRAPERITONEAL | Status: DC
  Administered 2020-12-30: 5000 mL via INTRAPERITONEAL

## 2020-12-29 NOTE — Progress Notes (Signed)
PROGRESS NOTE    Barbara Donovan  FAO:[id number] DOB: 28-Jan-1990 DOA: 12/28/2020 PCP: Fanny Bien, MD  Brief Narrative: 30/F with history of type 1 diabetes mellitus, gastroparesis, diabetic retinopathy, ESRD on peritoneal dialysis presented to the ED with nausea vomiting X 3 days. -In the ED her temp was 99.9, mildly tachycardic, hypertensive, labs noted WBC of 13.5, platelets of 625, creatinine of 12.8 CBG of 341 and CO2 of 17   Assessment & Plan:   Intractable nausea and vomiting SIRS -Appears to be improving, continue supportive care -Abdominal exam is benign, chest x-ray, urinalysis negative for infection, -PD fluid cell count is benign, follow-up cultures -Supportive care, start clears, continue IV Reglan, Phenergan as needed -Continue PPI  Uncontrolled type 2 diabetes mellitus with hyperglycemia Mild DKA, suspect acidosis mostly driven by ESRD -CBGs improving, continue Semglee, sliding scale insulin -Add 2 units NovoLog with meals, starting clears today  ESRD on peritoneal dialysis -Nephrology consulting, PD ongoing -PD fluid cell count appears benign  Hypertensive urgency -Continue home regimen of Coreg, amlodipine, hydralazine and losartan -IV hydralazine PRN  DVT prophylaxis: Heparin subcutaneous Code Status: Full code Family Communication: No family at bedside Disposition Plan:  Status is: Observation  The patient will require care spanning > 2 midnights and should be moved to inpatient because: Persistent nausea and vomiting, hypertensive urgency, peritoneal dialysis patient  Consultants:  Nephrology  Procedures:   Antimicrobials:    Subjective: -Feels tired, vomited multiple times yesterday  Objective: Vitals:   12/29/20 0126 12/29/20 0305 12/29/20 0501 12/29/20 0917  BP: (!) 168/99 (!) 160/89 (!) 147/94 130/87  Pulse: 96 99 97 (!) 108  Resp: 16 18 16 18   Temp: 98.2 F (36.8 C) 98.6 F (37 C) 98.9 F (37.2 C) 99.4 F (37.4 C)   TempSrc: Oral Oral Oral Oral  SpO2: 97% 98% 97% 98%  Weight:  64.5 kg    Height:        Intake/Output Summary (Last 24 hours) at 12/29/2020 1049 Last data filed at 12/29/2020 0400 Gross per 24 hour  Intake 1412.92 ml  Output --  Net 1412.92 ml   Filed Weights   12/28/20 0656 12/29/20 0305  Weight: 65.8 kg 64.5 kg    Examination:  General exam: Pleasant ill-appearing female laying in bed, somnolent but easily arousable, AAOx3 CVS: S1-S2, regular rate rhythm Lungs: Clear bilaterally Abdomen: Soft, nontender, nondistended, bowel sounds present, PD catheter noted Extremities: No edema Skin: No rash on exposed skin Psychiatry: Flat affect    Data Reviewed:   CBC: Recent Labs  Lab 12/27/20 1449 12/27/20 1736 12/28/20 0651 12/28/20 0956  WBC 11.4*  --  13.5*  --   NEUTROABS 9.2*  --   --   --   HGB 12.3 13.3 12.2 13.6  HCT 38.2 39.0 37.9 40.0  MCV 88.6  --  88.6  --   PLT 649*  --  625*  --    Basic Metabolic Panel: Recent Labs  Lab 12/27/20 1449 12/27/20 1736 12/27/20 1942 12/28/20 0651 12/28/20 0956 12/28/20 2045 12/29/20 0206  NA 142   < > 139 135 135 137 136  137  K 4.1   < > 3.9 3.9 3.9 3.9 3.9  4.0  CL 102  --  101 93*  --  98 97*  97*  CO2 18*  --  18* 17*  --  22 22  22   GLUCOSE 206*  --  275* 341*  --  192* 233*  232*  BUN 50*  --  54* 58*  --  61* 60*  62*  CREATININE 11.07*  --  11.46* 12.04*  --  12.03* 11.61*  11.63*  CALCIUM 10.1  --  9.2 9.6  --  9.1 9.1  9.0  PHOS  --   --   --   --   --   --  9.3*   < > = values in this interval not displayed.   GFR: Estimated Creatinine Clearance: 6.6 mL/min (A) (by C-G formula based on SCr of 11.63 mg/dL (H)). Liver Function Tests: Recent Labs  Lab 12/27/20 1449 12/28/20 0651 12/29/20 0206  AST 16 21  --   ALT 12 17  --   ALKPHOS 85 81  --   BILITOT 0.8 1.0  --   PROT 8.3* 7.9  --   ALBUMIN 4.0 3.8 3.1*   Recent Labs  Lab 12/27/20 1449 12/28/20 0651  LIPASE 45 42   No  results for input(s): AMMONIA in the last 168 hours. Coagulation Profile: No results for input(s): INR, PROTIME in the last 168 hours. Cardiac Enzymes: No results for input(s): CKTOTAL, CKMB, CKMBINDEX, TROPONINI in the last 168 hours. BNP (last 3 results) No results for input(s): PROBNP in the last 8760 hours. HbA1C: No results for input(s): HGBA1C in the last 72 hours. CBG: Recent Labs  Lab 12/27/20 2254 12/28/20 0646 12/28/20 0949 12/28/20 2205 12/29/20 0753  GLUCAP 226* 323* 330* 210* 296*   Lipid Profile: No results for input(s): CHOL, HDL, LDLCALC, TRIG, CHOLHDL, LDLDIRECT in the last 72 hours. Thyroid Function Tests: No results for input(s): TSH, T4TOTAL, FREET4, T3FREE, THYROIDAB in the last 72 hours. Anemia Panel: No results for input(s): VITAMINB12, FOLATE, FERRITIN, TIBC, IRON, RETICCTPCT in the last 72 hours. Urine analysis:    Component Value Date/Time   COLORURINE YELLOW 12/28/2020 0651   APPEARANCEUR HAZY (A) 12/28/2020 0651   LABSPEC 1.013 12/28/2020 0651   PHURINE 6.0 12/28/2020 0651   GLUCOSEU >=500 (A) 12/28/2020 0651   HGBUR SMALL (A) 12/28/2020 0651   BILIRUBINUR NEGATIVE 12/28/2020 0651   KETONESUR 5 (A) 12/28/2020 0651   PROTEINUR 100 (A) 12/28/2020 0651   UROBILINOGEN 0.2 08/14/2019 1051   NITRITE NEGATIVE 12/28/2020 0651   LEUKOCYTESUR NEGATIVE 12/28/2020 0651   Sepsis Labs: @LABRCNTIP (procalcitonin:4,lacticidven:4)  ) Recent Results (from the past 240 hour(s))  Resp Panel by RT-PCR (Flu A&B, Covid) Nasopharyngeal Swab     Status: None   Collection Time: 12/27/20  6:01 PM   Specimen: Nasopharyngeal Swab; Nasopharyngeal(NP) swabs in vial transport medium  Result Value Ref Range Status   SARS Coronavirus 2 by RT PCR NEGATIVE NEGATIVE Final    Comment: (NOTE) SARS-CoV-2 target nucleic acids are NOT DETECTED.  The SARS-CoV-2 RNA is generally detectable in upper respiratory specimens during the acute phase of infection. The  lowest concentration of SARS-CoV-2 viral copies this assay can detect is 138 copies/mL. A negative result does not preclude SARS-Cov-2 infection and should not be used as the sole basis for treatment or other patient management decisions. A negative result may occur with  improper specimen collection/handling, submission of specimen other than nasopharyngeal swab, presence of viral mutation(s) within the areas targeted by this assay, and inadequate number of viral copies(<138 copies/mL). A negative result must be combined with clinical observations, patient history, and epidemiological information. The expected result is Negative.  Fact Sheet for Patients:  EntrepreneurPulse.com.au  Fact Sheet for Healthcare Providers:  IncredibleEmployment.be  This test is no t yet approved or cleared by the [place]  FDA and  has been authorized for detection and/or diagnosis of SARS-CoV-2 by FDA under an Emergency Use Authorization (EUA). This EUA will remain  in effect (meaning this test can be used) for the duration of the COVID-19 declaration under Section 564(b)(1) of the Act, 21 U.S.C.section 360bbb-3(b)(1), unless the authorization is terminated  or revoked sooner.       Influenza A by PCR NEGATIVE NEGATIVE Final   Influenza B by PCR NEGATIVE NEGATIVE Final    Comment: (NOTE) The Xpert Xpress SARS-CoV-2/FLU/RSV plus assay is intended as an aid in the diagnosis of influenza from Nasopharyngeal swab specimens and should not be used as a sole basis for treatment. Nasal washings and aspirates are unacceptable for Xpert Xpress SARS-CoV-2/FLU/RSV testing.  Fact Sheet for Patients: EntrepreneurPulse.com.au  Fact Sheet for Healthcare Providers: IncredibleEmployment.be  This test is not yet approved or cleared by the [place] FDA and has been authorized for detection and/or diagnosis of SARS-CoV-2 by FDA under  an Emergency Use Authorization (EUA). This EUA will remain in effect (meaning this test can be used) for the duration of the COVID-19 declaration under Section 564(b)(1) of the Act, 21 U.S.C. section 360bbb-3(b)(1), unless the authorization is terminated or revoked.  Performed at [facility], [place] [address], [place], [place] [address]   Culture, blood (routine x 2)     Status: None (Preliminary result)   Collection Time: 12/28/20  8:32 PM   Specimen: BLOOD LEFT HAND  Result Value Ref Range Status   Specimen Description BLOOD LEFT HAND  Final   Special Requests   Final    BOTTLES DRAWN AEROBIC AND ANAEROBIC Blood Culture adequate volume   Culture   Final    NO GROWTH < 12 HOURS Performed at [facility], [place] [address] [place], [place] [address]    Report Status PENDING  Incomplete  Gram stain     Status: None   Collection Time: 12/29/20  5:33 AM   Specimen: Fluid  Result Value Ref Range Status   Specimen Description FLUID PERITONEAL DIALYSIS  Final   Special Requests NONE  Final   Gram Stain   Final    CYTOSPIN SMEAR WBC PRESENT,BOTH PMN AND MONONUCLEAR NO ORGANISMS SEEN Performed at [facility], [place] [address], [place], [place] [address]    Report Status 12/29/2020 FINAL  Final         Radiology Studies: DG CHEST PORT 1 VIEW  Result Date: 12/28/2020 CLINICAL DATA:  Nausea, vomiting EXAM: PORTABLE CHEST 1 VIEW COMPARISON:  06/10/2020 FINDINGS: Heart and mediastinal contours are within normal limits. No focal opacities or effusions. No acute bony abnormality. IMPRESSION: Negative. Electronically Signed   By: Rolm Baptise M.D.   On: 12/28/2020 18:32        Scheduled Meds:  amLODipine  10 mg Oral Daily   calcitRIOL  0.25 mcg Oral Daily   carvedilol  25 mg Oral BID   Chlorhexidine Gluconate Cloth  6 each Topical Daily   escitalopram  5 mg Oral Daily   gentamicin cream  1 application Topical Daily   heparin  5,000 Units Subcutaneous Q8H    hydrALAZINE  50 mg Oral TID   insulin aspart  0-6 Units Subcutaneous TID WC   insulin aspart  2 Units Subcutaneous TID WC   insulin glargine-yfgn  20 Units Subcutaneous Daily   losartan  100 mg Oral Daily   metoCLOPramide (REGLAN) injection  5 mg Intravenous Q8H   pantoprazole (PROTONIX) IV  40 mg Intravenous Q24H   sodium chloride flush  3 mL Intravenous Q12H   Continuous Infusions:  dialysis solution 1.5% low-MG/low-CA     dialysis solution 2.5% low-MG/low-CA     promethazine (PHENERGAN) injection (IM or IVPB) 25 mg (12/29/20 0202)     LOS: 0 days    Time spent: 83min   Domenic Polite, MD [facility]   12/29/2020, 10:49 AM

## 2020-12-29 NOTE — Progress Notes (Signed)
Mobility Specialist Progress Note    12/29/20 1646  Mobility  Activity Ambulated in hall  Level of Assistance Independent  Assistive Device None  Distance Ambulated (ft) 110 ft  Mobility Ambulated independently in hallway  Mobility Response Tolerated fair  Mobility performed by Mobility specialist  $Mobility charge 1 Mobility   Pt received in bed and agreeable. C/o lightheadedness. Returned to bed with call bell in reach.   [facility] Mobility Specialist  M.S. Primary Phone: [phone] M.S. Secondary Phone: [phone]

## 2020-12-29 NOTE — Plan of Care (Signed)
  Problem: Nutrition: Goal: Adequate nutrition will be maintained Outcome: Progressing   Problem: Pain Managment: Goal: General experience of comfort will improve Outcome: Progressing   

## 2020-12-29 NOTE — Progress Notes (Signed)
Asking for fluids/food, reports no problems with PD and N/V resolving  Gram stain on PD fluid showing no organisms  OP PD orders: Outpatient nephrologist: Dr. Joylene Grapes PD 7x week 5 exchanges, no day dwell, EDW 63kg, Dwell time 1.5hr, fill volume 2L Using 4 exchanges 1.5% dextrose and 1 exchange 2.5% dextrose   Assessment/Plan:  Intractable n/v = Low suspicion for peritonitis with no abdominal pain but will check peritoneal fluid cultures and cell count to rule it out.  Again orders written for PD tonight.  Will complete full consult if patient   Ernest Haber, PA-C [place] [phone] 12/29/2020,12:36 PM  LOS: 0 days

## 2020-12-30 LAB — CBC
HCT: 38.4 % (ref 36.0–46.0)
Hemoglobin: 12.6 g/dL (ref 12.0–15.0)
MCH: 28.6 pg (ref 26.0–34.0)
MCHC: 32.8 g/dL (ref 30.0–36.0)
MCV: 87.1 fL (ref 80.0–100.0)
Platelets: 585 10*3/uL — ABNORMAL HIGH (ref 150–400)
RBC: 4.41 MIL/uL (ref 3.87–5.11)
RDW: 14.6 % (ref 11.5–15.5)
WBC: 10.2 10*3/uL (ref 4.0–10.5)
nRBC: 0.7 % — ABNORMAL HIGH (ref 0.0–0.2)

## 2020-12-30 LAB — GLUCOSE, CAPILLARY
Glucose-Capillary: 272 mg/dL — ABNORMAL HIGH (ref 70–99)
Glucose-Capillary: 176 mg/dL — ABNORMAL HIGH (ref 70–99)
Glucose-Capillary: 149 mg/dL — ABNORMAL HIGH (ref 70–99)
Glucose-Capillary: 166 mg/dL — ABNORMAL HIGH (ref 70–99)
Glucose-Capillary: 224 mg/dL — ABNORMAL HIGH (ref 70–99)

## 2020-12-30 LAB — RENAL FUNCTION PANEL
Albumin: 3.3 g/dL — ABNORMAL LOW (ref 3.5–5.0)
Anion gap: 18 — ABNORMAL HIGH (ref 5–15)
BUN: 46 mg/dL — ABNORMAL HIGH (ref 6–20)
CO2: 20 mmol/L — ABNORMAL LOW (ref 22–32)
Calcium: 8.9 mg/dL (ref 8.9–10.3)
Chloride: 93 mmol/L — ABNORMAL LOW (ref 98–111)
Creatinine, Ser: 11.58 mg/dL — ABNORMAL HIGH (ref 0.44–1.00)
GFR, Estimated: 4 mL/min — ABNORMAL LOW (ref >60)
Glucose, Bld: 348 mg/dL — ABNORMAL HIGH (ref 70–99)
Phosphorus: 6.5 mg/dL — ABNORMAL HIGH (ref 2.5–4.6)
Potassium: 3.2 mmol/L — ABNORMAL LOW (ref 3.5–5.1)
Sodium: 131 mmol/L — ABNORMAL LOW (ref 135–145)

## 2020-12-30 LAB — PATHOLOGIST SMEAR REVIEW: Path Review: NEGATIVE

## 2020-12-30 MED ORDER — INSULIN GLARGINE-YFGN 100 UNIT/ML ~~LOC~~ SOLN
30.0000 [IU] | Freq: Every day | SUBCUTANEOUS | Status: DC
  Administered 2020-12-30 – 2020-12-31 (×2): 30 [IU] via SUBCUTANEOUS
  Filled 2020-12-30 (×2): qty 0.3

## 2020-12-30 MED ORDER — INSULIN ASPART 100 UNIT/ML IJ SOLN
4.0000 [IU] | Freq: Three times a day (TID) | INTRAMUSCULAR | Status: DC
  Administered 2020-12-30 – 2020-12-31 (×4): 4 [IU] via SUBCUTANEOUS

## 2020-12-30 MED ORDER — ONDANSETRON HCL 4 MG/2ML IJ SOLN
4.0000 mg | Freq: Four times a day (QID) | INTRAMUSCULAR | Status: DC | PRN
  Administered 2020-12-30 – 2020-12-31 (×3): 4 mg via INTRAVENOUS
  Filled 2020-12-30 (×3): qty 2

## 2020-12-30 MED ORDER — POTASSIUM CHLORIDE CRYS ER 20 MEQ PO TBCR
30.0000 meq | EXTENDED_RELEASE_TABLET | Freq: Once | ORAL | Status: AC
  Administered 2020-12-30: 30 meq via ORAL
  Filled 2020-12-30: qty 1

## 2020-12-30 NOTE — Consult Note (Signed)
[facility] Renal Consultation Note  Indication for Consultation:  Management of ESRD/hemodialysis; anemia, hypertension/volume and secondary hyperparathyroidism  HPI: Barbara Donovan is a 30 y.o. female with ESRD on PD followed by Dr. Osborne Casco history of DM type I with gastroparesis, diabetic retinopathy hypertension now admitted inpatient from observation previously admitted observation 1211/22 with nausea vomiting x3 days/unable to keep anything down.,  Noted PD fluid had no organism noted, she has had no issues with PD exchanges and denied any cloudy PD fluid, also had metabolic acidosis with elevated anion gap, HTN urgency BUN 97/102 on admission improved with IV labetalol for home BP meds restarted.  Over past 24 hours NV resolving tells me tolerated breakfast this a.m. and tolerating PD exchanges without abdominal pain.     Past Medical History:  Diagnosis Date   Asthma    as a child, no problems as an adult, no inhaler   Cataract    NS OU   Chronic hypertension during pregnancy, antepartum 08/19/2017   Dehydration 01/28/2018   Depression during pregnancy, antepartum 07/07/2017   6/20: Short trial of zoloft previously, reports didn't help much but also didn't give it a chance Discussed r/b/a SSRIs in pregnancy, agrees to try Zoloft again, rx sent No SI/HI/red flags   Diabetes ([place])    TYPE I. A1C 7.5% 05/31/20   Diabetic retinopathy ([place]) 06/09/2017   07/2017 with bilateral severe diabetic non-proliferative retinopathy with macular edema.   ESRD (end stage renal disease) on dialysis ([place])    4 x week  M T TH F   HTN (hypertension)    Hypertensive retinopathy    OU   Hypokalemia 01/22/2018   Hypomagnesemia 01/28/2018   Intractable nausea and vomiting 01/22/2018   Intrauterine growth restriction (IUGR) affecting care of mother 12/22/2017   Morbid obesity ([place])    Nephropathy, diabetic ([place]) 12/29/2017   Peritoneal dialysis status ([place])    Proteinuria due to type  1 diabetes mellitus ([place]) 11/02/2017   Baseline Pr: Cr 10.23   Severe hyperemesis gravidarum 10/30/2017   Type I diabetes mellitus ([place]) 07/07/2017   Current Diabetic Medications:  Insulin  [x]  Aspirin 81 mg daily after 12 weeks (? A2/B GDM)  Required Referrals for A1GDM or A2GDM: [x]  Diabetes Education and Testing Supplies [x]  Nutrition Cousult  For A2/B GDM or higher classes of DM [x]  Diabetes Education and Testing Supplies [x]  Nutrition Counsult [x]  Fetal ECHO after 20 weeks  [x]  Eye exam for retina evaluation - severe retinopathy 7/19  Base   Ventricular septal defect (VSD) of fetus in singleton pregnancy, antepartum 09/30/2017   May go to newborn nursery per Dr. Lenard Simmer Echo prior to discharge    Past Surgical History:  Procedure Laterality Date   25 GAUGE PARS PLANA VITRECTOMY WITH 20 GAUGE MVR PORT FOR MACULAR HOLE Left 07/20/2018   Procedure: 25 GAUGE PARS PLANA VITRECTOMY LEFT EYE;  Surgeon: Bernarda Caffey, MD;  Location: [facility];  Service: Ophthalmology;  Laterality: Left;   CAPD INSERTION N/A 09/10/2020   Procedure: LAPAROSCOPIC INSERTION CONTINUOUS AMBULATORY PERITONEAL DIALYSIS  (CAPD) CATHETER;  Surgeon: Serafina Mitchell, MD;  Location: [place] [facility];  Service: Vascular;  Laterality: N/A;   CESAREAN SECTION N/A 12/26/2017   Procedure: CESAREAN SECTION;  Surgeon: Osborne Oman, MD;  Location: [place];  Service: Obstetrics;  Laterality: N/A;   EYE EXAMINATION UNDER ANESTHESIA Right 07/20/2018   Procedure: Eye Exam Under Anesthesia RIGHT EYE;  Surgeon: Bernarda Caffey, MD;  Location: [place];  Service: Ophthalmology;  Laterality: Right;   EYE SURGERY Left 07/2018   GAS INSERTION Left 07/19/2019   Procedure: INSERTION OF GAS;  Surgeon: Bernarda Caffey, MD;  Location: [facility];  Service: Ophthalmology;  Laterality: Left;  SF6   INJECTION OF SILICONE OIL Left 08/22/1658   Procedure: Injection Of Silicone Oil LEFT EYE;  Surgeon: Bernarda Caffey, MD;  Location: [facility];  Service: Ophthalmology;  Laterality:  Left;   IR FLUORO GUIDE CV LINE RIGHT  06/02/2020   IR US GUIDE VASC ACCESS RIGHT  06/02/2020   LASER PHOTO ABLATION Right 07/20/2018   Procedure: Laser Photo Ablation RIGHT EYE;  Surgeon: Bernarda Caffey, MD;  Location: [facility];  Service: Ophthalmology;  Laterality: Right;   MEMBRANE PEEL Left 07/20/2018   Procedure: Membrane Peel LEFT EYE;  Surgeon: Bernarda Caffey, MD;  Location: [facility];  Service: Ophthalmology;  Laterality: Left;   MEMBRANE PEEL Left 07/19/2019   Procedure: MEMBRANE PEEL;  Surgeon: Bernarda Caffey, MD;  Location: [facility];  Service: Ophthalmology;  Laterality: Left;   MITOMYCIN C APPLICATION Bilateral 06/21/158   Procedure: Avastin Application;  Surgeon: Bernarda Caffey, MD;  Location: [facility];  Service: Ophthalmology;  Laterality: Bilateral;   PHOTOCOAGULATION WITH LASER Left 07/20/2018   Procedure: Photocoagulation With Laser LEFT EYE;  Surgeon: Bernarda Caffey, MD;  Location: [facility];  Service: Ophthalmology;  Laterality: Left;   PHOTOCOAGULATION WITH LASER Left 07/19/2019   Procedure: PHOTOCOAGULATION WITH LASER;  Surgeon: Bernarda Caffey, MD;  Location: [facility];  Service: Ophthalmology;  Laterality: Left;   RETINAL DETACHMENT SURGERY Left 07/20/2018   Dr. Bernarda Caffey   SILICON OIL REMOVAL Left 07/19/2019   Procedure: 25g PARS PLANA VITRECTOMY WITH SILICON OIL REMOVAL;  Surgeon: Bernarda Caffey, MD;  Location: [facility];  Service: Ophthalmology;  Laterality: Left;   WISDOM TOOTH EXTRACTION        Family History  Problem Relation Age of Onset   Diabetes Mother    Aneurysm Mother 58   Seizures Mother    Diabetes Father    Cataracts Father    COPD Father    Heart attack Father    Heart disease Father    Healthy Sister    Healthy Daughter    Stroke Maternal Grandfather    Amblyopia Neg Hx    Blindness Neg Hx    Glaucoma Neg Hx    Macular degeneration Neg Hx    Retinal detachment Neg Hx    Strabismus Neg Hx    Retinitis pigmentosa Neg Hx    Colon cancer Neg Hx    Stomach cancer Neg Hx     Esophageal cancer Neg Hx    Pancreatic cancer Neg Hx    Liver disease Neg Hx       reports that she has never smoked. She has never used smokeless tobacco. She reports that she does not currently use alcohol. She reports that she does not use drugs.  No Known Allergies  Prior to Admission medications   Medication Sig Start Date End Date Taking? Authorizing Provider  acetaminophen (TYLENOL) 500 MG tablet Take 1,000 mg by mouth every 6 (six) hours as needed for mild pain.   Yes [provider]  amLODipine (NORVASC) 10 MG tablet Take 1 tablet (10 mg total) by mouth daily. 03/09/20  Yes Mercy Riding, MD  calcitRIOL (ROCALTROL) 0.25 MCG capsule Take 0.25 mcg by mouth daily. 12/02/20  Yes [provider]  carvedilol (COREG) 25 MG tablet Take 1 tablet (25 mg total) by mouth 2 (two) times  daily. 03/07/19  Yes Imogene Burn, PA-C  escitalopram (LEXAPRO) 5 MG tablet Take 5 mg by mouth daily. 12/22/20  Yes [provider]  gentamicin cream (GARAMYCIN) 0.1 % Apply 1 application topically daily. 09/10/20  Yes [provider]  hydrALAZINE (APRESOLINE) 50 MG tablet Take 2 tablets (100 mg total) by mouth 3 (three) times daily. Patient taking differently: Take 50 mg by mouth 3 (three) times daily. 06/09/20  Yes Vann, Emmani U, DO  insulin glargine (LANTUS SOLOSTAR) 100 UNIT/ML Solostar Pen Inject 20 Units into the skin every morning. 11/13/20  Yes Thurnell Lose, MD  insulin lispro (HUMALOG KWIKPEN) 100 UNIT/ML KwikPen Inject 3 Units into the skin 3 (three) times daily with meals. 11/28/20  Yes Renato Shin, MD  losartan (COZAAR) 50 MG tablet Take 2 tablets (100 mg total) by mouth daily. 06/09/20  Yes Geradine Girt, DO  metoCLOPramide (REGLAN) 5 MG tablet Take 1 tablet (5 mg total) by mouth 4 (four) times daily -  before meals and at bedtime. 08/28/20  Yes Levin Erp, PA  multivitamin (RENA-VIT) TABS tablet Take 1 tablet by mouth at bedtime. 03/08/20  Yes  Mercy Riding, MD  ondansetron (ZOFRAN ODT) 4 MG disintegrating tablet Take 1 tablet (4 mg total) by mouth every 8 (eight) hours as needed for nausea or vomiting. 11/13/20  Yes Thurnell Lose, MD  promethazine (PHENERGAN) 25 MG suppository Place 1 suppository (25 mg total) rectally every 6 (six) hours as needed for nausea or vomiting. 11/25/20  Yes Charlann Lange, PA-C  Continuous Blood Gluc Sensor MISC 1 each by Does not apply route as directed. Use as directed every 14 days. May dispense FreeStyle [employer] or similar. 01/27/20   Antonieta Pert, MD  glucose blood (ONETOUCH VERIO) test strip 1 each by Other route 3 (three) times daily. And lancets 3/day 11/28/20   Renato Shin, MD      .  ROS: See above HPI   Physical Exam: Vitals:   12/30/20 0451 12/30/20 0745  BP: (!) 163/100 (!) 158/104  Pulse: (!) 106 (!) 110  Resp: 18 14  Temp: 98.9 F (37.2 C) 98.2 F (36.8 C)  SpO2: 99% 99%     General: Alert , pleasant, young adult female ,NAD  HEENT: [place], EOMI, nonicteric, PERRLA Neck: Supple, no JVD Heart: RRR no MRG Lungs: CTA, nonlabored breathing Abdomen: NABS, soft, NTND  Extremities: No pedal edema Skin: Warm dry, no acute rash or pedal ulcers Neuro: Alert O x3 moves all extremities to request, no acute deficits appreciated no asterixis Dialysis Access: PD catheter intact dressing dry clear nontender  OP PD orders: Outpatient nephrologist: Dr. Joylene Grapes PD 7x week 5 exchanges, no day dwell, EDW 63kg, Dwell time 1.5hr, fill volume 2L Using 4 exchanges 1.5% dextrose and 1 exchange 2.5% dextrose   Assessment/Plan Intractable nausea vomiting/probable gastroparesis= so far PD fluid negative symptoms resolving now plan for admit ESRD -continue CCPD, a.m. labs K3.2 NA 131, CO2 20 BUN 46 CR 11.6, will give p.o. supplement for low K Hypertension/volume  -a.m. BP 149/0103 , back on p.o. meds = amlodipine 10 mg daily, Coreg 25 mg twice daily, hydralazine 50 mg TID, losartan 100 mg  daily ,no excess volume on exam states uses PD bags help control BP, use all 2.5 % with next PD Anemia  -Hgb 12.6 no indication for ESA Metabolic bone disease -corrected calcium okay phosphorus 6.5 continue binder p.o. calcitriol 0.25 MCG daily Diabetes mellitus = per admit team  Nutrition -ALB 3.3, renal carb modified diet  Ernest Haber, PA-C [place] [phone] 12/30/2020, 11:34 AM

## 2020-12-30 NOTE — Progress Notes (Signed)
PROGRESS NOTE    Barbara Donovan  PYP:[id number] DOB: 07/13/1990 DOA: 12/28/2020 PCP: Fanny Bien, MD  Brief Narrative: 30/F with history of type 1 diabetes mellitus, gastroparesis, diabetic retinopathy, ESRD on peritoneal dialysis presented to the ED with nausea vomiting X 3 days. -In the ED her temp was 99.9, mildly tachycardic, hypertensive, labs noted WBC of 13.5, platelets of 625, creatinine of 12.8 CBG of 341 and CO2 of 17   Assessment & Plan:   Intractable nausea and vomiting SIRS -Appears to be improving, continue supportive care -Abdominal exam is benign, chest x-ray, urinalysis negative for infection, -PD fluid cell count is benign, cultures negative X1 day -Continue supportive care, advance diet, continue IV Reglan, will discontinue Phenergan to avoid oversedation -Continue PPI  Uncontrolled type 2 diabetes mellitus with hyperglycemia Mild DKA, suspect acidosis mostly driven by ESRD -CBGs remain considerably elevated, increase Semglee and meal coverage dose  ESRD on peritoneal dialysis -Nephrology consulting, PD ongoing -PD fluid cell count appears benign, follow-up cultures  Hypertensive urgency -Continue home regimen of Coreg, amlodipine, hydralazine and losartan -IV hydralazine PRN  DVT prophylaxis: Heparin subcutaneous Code Status: Full code Family Communication: No family at bedside Disposition Plan: Home pending clinical improvement, likely 24 to 48 hours Status is: Inpatient Consultants:  Nephrology  Procedures:   Antimicrobials:    Subjective: -No further vomiting, feels extremely tired  Objective: Vitals:   12/29/20 2054 12/30/20 0451 12/30/20 0745 12/30/20 1000  BP: (!) 158/100 (!) 163/100 (!) 158/104 (!) 149/103  Pulse: (!) 108 (!) 106 (!) 110 (!) 102  Resp: 18 18 14 20   Temp: 98.2 F (36.8 C) 98.9 F (37.2 C) 98.2 F (36.8 C) 99.7 F (37.6 C)  TempSrc: Oral Oral Oral Oral  SpO2: 95% 99% 99% 99%  Weight:  70.8 kg  69.5 kg   Height:        Intake/Output Summary (Last 24 hours) at 12/30/2020 1147 Last data filed at 12/30/2020 0900 Gross per 24 hour  Intake 10708 ml  Output 11999 ml  Net -1291 ml   Filed Weights   12/29/20 1925 12/30/20 0451 12/30/20 1000  Weight: 64.5 kg 70.8 kg 69.5 kg    Examination:  Pleasant female laying in bed, ill-appearing, AAOx3, flat affect, no distress CVS: S1-S2, regular rate rhythm Lungs: Clear bilaterally Abdomen: Soft, nontender, nondistended, PD catheter noted Extremities: No edema  Skin: No rash on exposed skin Psychiatry: Flat affect    Data Reviewed:   CBC: Recent Labs  Lab 12/27/20 1449 12/27/20 1736 12/28/20 0651 12/28/20 0956 12/30/20 0334  WBC 11.4*  --  13.5*  --  10.2  NEUTROABS 9.2*  --   --   --   --   HGB 12.3 13.3 12.2 13.6 12.6  HCT 38.2 39.0 37.9 40.0 38.4  MCV 88.6  --  88.6  --  87.1  PLT 649*  --  625*  --  245*   Basic Metabolic Panel: Recent Labs  Lab 12/27/20 1942 12/28/20 0651 12/28/20 0956 12/28/20 2045 12/29/20 0206 12/30/20 0334  NA 139 135 135 137 136   137 131*  K 3.9 3.9 3.9 3.9 3.9   4.0 3.2*  CL 101 93*  --  98 97*   97* 93*  CO2 18* 17*  --  22 22   22  20*  GLUCOSE 275* 341*  --  192* 233*   232* 348*  BUN 54* 58*  --  61* 60*   62* 46*  CREATININE 11.46* 12.04*  --  12.03* 11.61*   11.63* 11.58*  CALCIUM 9.2 9.6  --  9.1 9.1   9.0 8.9  PHOS  --   --   --   --  9.3* 6.5*   GFR: Estimated Creatinine Clearance: 6.7 mL/min (A) (by C-G formula based on SCr of 11.58 mg/dL (H)). Liver Function Tests: Recent Labs  Lab 12/27/20 1449 12/28/20 0651 12/29/20 0206 12/30/20 0334  AST 16 21  --   --   ALT 12 17  --   --   ALKPHOS 85 81  --   --   BILITOT 0.8 1.0  --   --   PROT 8.3* 7.9  --   --   ALBUMIN 4.0 3.8 3.1* 3.3*   Recent Labs  Lab 12/27/20 1449 12/28/20 0651  LIPASE 45 42   No results for input(s): AMMONIA in the last 168 hours. Coagulation Profile: No results for input(s): INR, PROTIME in  the last 168 hours. Cardiac Enzymes: No results for input(s): CKTOTAL, CKMB, CKMBINDEX, TROPONINI in the last 168 hours. BNP (last 3 results) No results for input(s): PROBNP in the last 8760 hours. HbA1C: No results for input(s): HGBA1C in the last 72 hours. CBG: Recent Labs  Lab 12/29/20 0753 12/29/20 1255 12/29/20 1725 12/29/20 2057 12/30/20 0747  GLUCAP 296* 259* 158* 253* 272*   Lipid Profile: No results for input(s): CHOL, HDL, LDLCALC, TRIG, CHOLHDL, LDLDIRECT in the last 72 hours. Thyroid Function Tests: No results for input(s): TSH, T4TOTAL, FREET4, T3FREE, THYROIDAB in the last 72 hours. Anemia Panel: No results for input(s): VITAMINB12, FOLATE, FERRITIN, TIBC, IRON, RETICCTPCT in the last 72 hours. Urine analysis:    Component Value Date/Time   COLORURINE YELLOW 12/28/2020 0651   APPEARANCEUR HAZY (A) 12/28/2020 0651   LABSPEC 1.013 12/28/2020 0651   PHURINE 6.0 12/28/2020 0651   GLUCOSEU >=500 (A) 12/28/2020 0651   HGBUR SMALL (A) 12/28/2020 0651   BILIRUBINUR NEGATIVE 12/28/2020 0651   KETONESUR 5 (A) 12/28/2020 0651   PROTEINUR 100 (A) 12/28/2020 0651   UROBILINOGEN 0.2 08/14/2019 1051   NITRITE NEGATIVE 12/28/2020 0651   LEUKOCYTESUR NEGATIVE 12/28/2020 0651   Sepsis Labs: @LABRCNTIP (procalcitonin:4,lacticidven:4)  ) Recent Results (from the past 240 hour(s))  Resp Panel by RT-PCR (Flu A&B, Covid) Nasopharyngeal Swab     Status: None   Collection Time: 12/27/20  6:01 PM   Specimen: Nasopharyngeal Swab; Nasopharyngeal(NP) swabs in vial transport medium  Result Value Ref Range Status   SARS Coronavirus 2 by RT PCR NEGATIVE NEGATIVE Final    Comment: (NOTE) SARS-CoV-2 target nucleic acids are NOT DETECTED.  The SARS-CoV-2 RNA is generally detectable in upper respiratory specimens during the acute phase of infection. The lowest concentration of SARS-CoV-2 viral copies this assay can detect is 138 copies/mL. A negative result does not preclude  SARS-Cov-2 infection and should not be used as the sole basis for treatment or other patient management decisions. A negative result may occur with  improper specimen collection/handling, submission of specimen other than nasopharyngeal swab, presence of viral mutation(s) within the areas targeted by this assay, and inadequate number of viral copies(<138 copies/mL). A negative result must be combined with clinical observations, patient history, and epidemiological information. The expected result is Negative.  Fact Sheet for Patients:  EntrepreneurPulse.com.au  Fact Sheet for Healthcare Providers:  IncredibleEmployment.be  This test is no t yet approved or cleared by the [place] FDA and  has been authorized for detection and/or diagnosis of SARS-CoV-2 by FDA under an Emergency Use  Authorization (EUA). This EUA will remain  in effect (meaning this test can be used) for the duration of the COVID-19 declaration under Section 564(b)(1) of the Act, 21 U.S.C.section 360bbb-3(b)(1), unless the authorization is terminated  or revoked sooner.       Influenza A by PCR NEGATIVE NEGATIVE Final   Influenza B by PCR NEGATIVE NEGATIVE Final    Comment: (NOTE) The Xpert Xpress SARS-CoV-2/FLU/RSV plus assay is intended as an aid in the diagnosis of influenza from Nasopharyngeal swab specimens and should not be used as a sole basis for treatment. Nasal washings and aspirates are unacceptable for Xpert Xpress SARS-CoV-2/FLU/RSV testing.  Fact Sheet for Patients: EntrepreneurPulse.com.au  Fact Sheet for Healthcare Providers: IncredibleEmployment.be  This test is not yet approved or cleared by the [place] FDA and has been authorized for detection and/or diagnosis of SARS-CoV-2 by FDA under an Emergency Use Authorization (EUA). This EUA will remain in effect (meaning this test can be used) for the duration of  the COVID-19 declaration under Section 564(b)(1) of the Act, 21 U.S.C. section 360bbb-3(b)(1), unless the authorization is terminated or revoked.  Performed at [facility], [place] [address], [place], [place] [address]   Culture, blood (routine x 2)     Status: None (Preliminary result)   Collection Time: 12/28/20  8:32 PM   Specimen: BLOOD LEFT HAND  Result Value Ref Range Status   Specimen Description BLOOD LEFT HAND  Final   Special Requests   Final    BOTTLES DRAWN AEROBIC AND ANAEROBIC Blood Culture adequate volume   Culture   Final    NO GROWTH 2 DAYS Performed at [facility], [place] [address], [place], [place] [address]    Report Status PENDING  Incomplete  Culture, blood (routine x 2)     Status: None (Preliminary result)   Collection Time: 12/29/20  1:59 AM   Specimen: BLOOD RIGHT HAND  Result Value Ref Range Status   Specimen Description BLOOD RIGHT HAND  Final   Special Requests   Final    BOTTLES DRAWN AEROBIC AND ANAEROBIC Blood Culture adequate volume   Culture   Final    NO GROWTH 1 DAY Performed at [facility], [place] [address], [place], [place] [address]    Report Status PENDING  Incomplete  Culture, body fluid w Gram Stain-bottle     Status: None (Preliminary result)   Collection Time: 12/29/20  5:32 AM   Specimen: Fluid  Result Value Ref Range Status   Specimen Description FLUID PERITONEAL DIALYSIS  Final   Special Requests   Final    BOTTLES DRAWN AEROBIC AND ANAEROBIC Blood Culture adequate volume   Culture   Final    NO GROWTH 1 DAY Performed at [facility], [place] [address], [place],  [address]    Report Status PENDING  Incomplete  Gram stain     Status: None   Collection Time: 12/29/20  5:33 AM   Specimen: Fluid  Result Value Ref Range Status   Specimen Description FLUID PERITONEAL DIALYSIS  Final   Special Requests NONE  Final   Gram Stain   Final    CYTOSPIN SMEAR WBC PRESENT,BOTH PMN AND MONONUCLEAR NO ORGANISMS  SEEN Performed at [facility], [place] [address] [place],  [address]    Report Status 12/29/2020 FINAL  Final         Radiology Studies: DG CHEST PORT 1 VIEW  Result Date: 12/28/2020 CLINICAL DATA:  Nausea, vomiting EXAM: PORTABLE CHEST 1 VIEW COMPARISON:  06/10/2020 FINDINGS: Heart and mediastinal contours are within normal limits. No focal opacities or effusions. No acute bony abnormality. IMPRESSION: Negative. Electronically Signed   By: Rolm Baptise M.D.   On: 12/28/2020 18:32        Scheduled Meds:  amLODipine  10 mg Oral Daily   calcitRIOL  0.25 mcg Oral Daily   carvedilol  25 mg Oral BID   Chlorhexidine Gluconate Cloth  6 each Topical Daily   escitalopram  5 mg Oral Daily   gentamicin cream  1 application Topical Daily   heparin  5,000 Units Subcutaneous Q8H   hydrALAZINE  50 mg Oral TID   insulin aspart  0-6 Units Subcutaneous TID WC   insulin aspart  4 Units Subcutaneous TID WC   insulin glargine-yfgn  30 Units Subcutaneous Daily   losartan  100 mg Oral Daily   metoCLOPramide (REGLAN) injection  5 mg Intravenous TID AC   pantoprazole (PROTONIX) IV  40 mg Intravenous Q24H   sodium chloride flush  3 mL Intravenous Q12H   Continuous Infusions:  dialysis solution 1.5% low-MG/low-CA     dialysis solution 2.5% low-MG/low-CA       LOS: 1 day    Time spent: 30min   Domenic Polite, MD [facility]   12/30/2020, 11:47 AM

## 2020-12-30 NOTE — Progress Notes (Signed)
Mobility Specialist Progress Note:   12/30/20 1045  Mobility  Activity Ambulated in hall  Level of Assistance Standby assist, set-up cues, supervision of patient - no hands on  Assistive Device Front wheel walker  Distance Ambulated (ft) 190 ft  Mobility Ambulated independently in hallway  Mobility Response Tolerated fair  Mobility performed by Mobility specialist  Bed Position Chair  $Mobility charge 1 Mobility   Pt c/o generalized weakness and nausea today. Requested to use RW during ambulation for comfort. Pt required x1 seated rest break at 145' d/t nausea. Pt back in chair with all needs met.   Nelta Numbers Mobility Specialist  Phone [phone]

## 2020-12-30 NOTE — Social Work (Signed)
°  Transition of Care [facility]) Screening Note   Patient Details  Name: Barbara Donovan Date of Birth: November 04, 1990   Transition of Care [facility]) CM/SW Contact:    Emeterio Reeve, LCSW Phone Number: 12/30/2020, 3:59 PM    Transition of Care Department [facility]) has reviewed patient and no TOC needs have been identified at this time. We will continue to monitor patient advancement through interdisciplinary progression rounds. If new patient transition needs arise, please place a TOC consult.

## 2020-12-30 NOTE — Progress Notes (Signed)
Mobility Specialist Progress Note:   12/30/20 1400  Mobility  Activity Ambulated in hall  Level of Assistance Modified independent, requires aide device or extra time  Assistive Device Front wheel walker  Distance Ambulated (ft) 30 ft  Mobility Ambulated independently in hallway  Mobility Response Tolerated poorly  Mobility performed by Mobility specialist  $Mobility charge 1 Mobility   Pt c/o significant weakness upon exertion. Ambulated 15' then leaned on forearms d/t weakness. Further mobility deferred. Pt back in bed with all needs met.   Nelta Numbers Mobility Specialist  Phone [phone]

## 2020-12-31 ENCOUNTER — Encounter (INDEPENDENT_AMBULATORY_CARE_PROVIDER_SITE_OTHER): Payer: 59 | Admitting: Ophthalmology

## 2020-12-31 DIAGNOSIS — E103513 Type 1 diabetes mellitus with proliferative diabetic retinopathy with macular edema, bilateral: Secondary | ICD-10-CM

## 2020-12-31 DIAGNOSIS — H35372 Puckering of macula, left eye: Secondary | ICD-10-CM

## 2020-12-31 DIAGNOSIS — H4313 Vitreous hemorrhage, bilateral: Secondary | ICD-10-CM

## 2020-12-31 DIAGNOSIS — H3342 Traction detachment of retina, left eye: Secondary | ICD-10-CM

## 2020-12-31 DIAGNOSIS — H35033 Hypertensive retinopathy, bilateral: Secondary | ICD-10-CM

## 2020-12-31 DIAGNOSIS — H3581 Retinal edema: Secondary | ICD-10-CM

## 2020-12-31 DIAGNOSIS — H25811 Combined forms of age-related cataract, right eye: Secondary | ICD-10-CM

## 2020-12-31 DIAGNOSIS — Z961 Presence of intraocular lens: Secondary | ICD-10-CM

## 2020-12-31 DIAGNOSIS — I10 Essential (primary) hypertension: Secondary | ICD-10-CM

## 2020-12-31 LAB — CBC
HCT: 40.1 % (ref 36.0–46.0)
Hemoglobin: 13 g/dL (ref 12.0–15.0)
MCH: 28.5 pg (ref 26.0–34.0)
MCHC: 32.4 g/dL (ref 30.0–36.0)
MCV: 87.9 fL (ref 80.0–100.0)
Platelets: 514 10*3/uL — ABNORMAL HIGH (ref 150–400)
RBC: 4.56 MIL/uL (ref 3.87–5.11)
RDW: 14.8 % (ref 11.5–15.5)
WBC: 8.8 10*3/uL (ref 4.0–10.5)
nRBC: 1.1 % — ABNORMAL HIGH (ref 0.0–0.2)

## 2020-12-31 LAB — COMPREHENSIVE METABOLIC PANEL
ALT: 13 U/L (ref 0–44)
AST: 12 U/L — ABNORMAL LOW (ref 15–41)
Albumin: 3.2 g/dL — ABNORMAL LOW (ref 3.5–5.0)
Alkaline Phosphatase: 70 U/L (ref 38–126)
Anion gap: 15 (ref 5–15)
BUN: 44 mg/dL — ABNORMAL HIGH (ref 6–20)
CO2: 22 mmol/L (ref 22–32)
Calcium: 8.6 mg/dL — ABNORMAL LOW (ref 8.9–10.3)
Chloride: 95 mmol/L — ABNORMAL LOW (ref 98–111)
Creatinine, Ser: 11.48 mg/dL — ABNORMAL HIGH (ref 0.44–1.00)
GFR, Estimated: 4 mL/min — ABNORMAL LOW (ref >60)
Glucose, Bld: 292 mg/dL — ABNORMAL HIGH (ref 70–99)
Potassium: 3.2 mmol/L — ABNORMAL LOW (ref 3.5–5.1)
Sodium: 132 mmol/L — ABNORMAL LOW (ref 135–145)
Total Bilirubin: 0.5 mg/dL (ref 0.3–1.2)
Total Protein: 7.2 g/dL (ref 6.5–8.1)

## 2020-12-31 LAB — GLUCOSE, CAPILLARY
Glucose-Capillary: 293 mg/dL — ABNORMAL HIGH (ref 70–99)
Glucose-Capillary: 196 mg/dL — ABNORMAL HIGH (ref 70–99)

## 2020-12-31 MED ORDER — POTASSIUM CHLORIDE CRYS ER 20 MEQ PO TBCR
40.0000 meq | EXTENDED_RELEASE_TABLET | Freq: Once | ORAL | Status: AC
  Administered 2020-12-31: 11:00:00 40 meq via ORAL
  Filled 2020-12-31: qty 2

## 2020-12-31 MED ORDER — METOCLOPRAMIDE HCL 5 MG PO TABS: 5.0000 mg | ORAL_TABLET | Freq: Three times a day (TID) | ORAL | Status: DC

## 2020-12-31 MED ORDER — HYDRALAZINE HCL 50 MG PO TABS: 50.0000 mg | ORAL_TABLET | Freq: Three times a day (TID) | ORAL | Status: DC

## 2020-12-31 NOTE — Plan of Care (Signed)
°  Problem: Nutrition: Goal: Adequate nutrition will be maintained 12/31/2020 1204 by Rudene Christians, RN Outcome: Adequate for Discharge 12/31/2020 1204 by Rudene Christians, RN Outcome: Adequate for Discharge   Problem: Elimination: Goal: Will not experience complications related to bowel motility 12/31/2020 1204 by Rudene Christians, RN Outcome: Adequate for Discharge 12/31/2020 1204 by Rudene Christians, RN Outcome: Adequate for Discharge Goal: Will not experience complications related to urinary retention 12/31/2020 1204 by Rudene Christians, RN Outcome: Adequate for Discharge 12/31/2020 1204 by Rudene Christians, RN Outcome: Adequate for Discharge   Problem: Pain Managment: Goal: General experience of comfort will improve 12/31/2020 1204 by Rudene Christians, RN Outcome: Adequate for Discharge 12/31/2020 1204 by Rudene Christians, RN Outcome: Adequate for Discharge   Problem: Safety: Goal: Ability to remain free from injury will improve 12/31/2020 1204 by Rudene Christians, RN Outcome: Adequate for Discharge 12/31/2020 1204 by Rudene Christians, RN Outcome: Adequate for Discharge   Problem: Skin Integrity: Goal: Risk for impaired skin integrity will decrease 12/31/2020 1204 by Rudene Christians, RN Outcome: Adequate for Discharge 12/31/2020 1204 by Rudene Christians, RN Outcome: Adequate for Discharge

## 2020-12-31 NOTE — Discharge Summary (Addendum)
Physician Discharge Summary  MIRCA Barbara Donovan MGQ:[id number] DOB: 1990-09-15 DOA: 12/28/2020  PCP: Fanny Bien, MD  Admit date: 12/28/2020 Discharge date: 12/31/2020  Time spent: 35 minutes  Recommendations for Outpatient Follow-up:  PCP in 1 week Nephrology Dr. Osborne Casco in 2 weeks   Discharge Diagnoses:  Principal Problem:   Intractable nausea and vomiting  SIRS Gastroparesis ESRD on peritoneal dialysis Uncontrolled type 2 diabetes mellitus with hyperglycemia Mild DKA Hypertensive urgency   Hyperglycemia   Nausea and vomiting   Discharge Condition: Stable  Diet recommendation: Renal, carb modified  Filed Weights   12/30/20 1820 12/31/20 0448 12/31/20 0705  Weight: 66 kg 65.3 kg 63 kg    History of present illness:  30/F with history of type 1 diabetes mellitus, gastroparesis, diabetic retinopathy, ESRD on peritoneal dialysis presented to the ED with nausea vomiting X 3 days. -In the ED her temp was 99.9, mildly tachycardic, hypertensive, labs noted WBC of 13.5, platelets of 625, creatinine of 12.8 CBG of 341 and CO2 of 17  Hospital Course:   Intractable nausea and vomiting SIRS -Suspected to be flare of gastroparesis -Abdominal exam is benign, chest x-ray, urinalysis negative for infection, -PD fluid cell count is benign, cultures negative X2  days -Clinically improved with supportive care, IV Reglan, Phenergan as needed, PPI -Tolerating regular diet at the time of discharge -Follow-up with PCP in 1 week, advised to continue Reglan before meals for the next 3 days   Uncontrolled type 2 diabetes mellitus with hyperglycemia Mild DKA, suspect acidosis mostly driven by ESRD -Improved, continue Lantus and meal coverage NovoLog   ESRD on peritoneal dialysis -Nephrology consulted, had peritoneal dialysis inpatient -PD fluid cultures were negative for infection   Hypertensive urgency -Continue home regimen of Coreg, amlodipine, hydralazine and  losartan -Improving with fluid removal on PD    Discharge Exam: Vitals:   12/31/20 0705 12/31/20 0723  BP: (!) 145/95 (!) 148/97  Pulse: (!) 102 (!) 107  Resp: 18 16  Temp: 98.5 F (36.9 C) 98 F (36.7 C)  SpO2: 97% 98%   Pleasant female laying in bed, ill-appearing, AAOx3, flat affect, no distress CVS: S1-S2, regular rate rhythm Lungs: Clear bilaterally Abdomen: Soft, nontender, nondistended, PD catheter noted Extremities: No edema  Skin: No rash on exposed skin Psychiatry: Flat affect Discharge Instructions   Discharge Instructions     Diet - low sodium heart healthy   Complete by: As directed    Diet Carb Modified   Complete by: As directed    Increase activity slowly   Complete by: As directed    No wound care   Complete by: As directed       Allergies as of 12/31/2020   No Known Allergies      Medication List     TAKE these medications    acetaminophen 500 MG tablet Commonly known as: TYLENOL Take 1,000 mg by mouth every 6 (six) hours as needed for mild pain.   amLODipine 10 MG tablet Commonly known as: NORVASC Take 1 tablet (10 mg total) by mouth daily.   calcitRIOL 0.25 MCG capsule Commonly known as: ROCALTROL Take 0.25 mcg by mouth daily.   carvedilol 25 MG tablet Commonly known as: COREG Take 1 tablet (25 mg total) by mouth 2 (two) times daily.   Continuous Blood Gluc Sensor Misc 1 each by Does not apply route as directed. Use as directed every 14 days. May dispense FreeStyle [employer] or similar.   escitalopram 5 MG tablet  Commonly known as: LEXAPRO Take 5 mg by mouth daily.   gentamicin cream 0.1 % Commonly known as: GARAMYCIN Apply 1 application topically daily.   hydrALAZINE 50 MG tablet Commonly known as: APRESOLINE Take 1 tablet (50 mg total) by mouth 3 (three) times daily.   insulin lispro 100 UNIT/ML KwikPen Commonly known as: HumaLOG KwikPen Inject 3 Units into the skin 3 (three) times daily with meals.    Lantus SoloStar 100 UNIT/ML Solostar Pen Generic drug: insulin glargine Inject 20 Units into the skin every morning.   losartan 50 MG tablet Commonly known as: COZAAR Take 2 tablets (100 mg total) by mouth daily.   metoCLOPramide 5 MG tablet Commonly known as: REGLAN Take 1 tablet (5 mg total) by mouth 3 (three) times daily before meals for 3 days. What changed: when to take this   multivitamin Tabs tablet Take 1 tablet by mouth at bedtime.   ondansetron 4 MG disintegrating tablet Commonly known as: Zofran ODT Take 1 tablet (4 mg total) by mouth every 8 (eight) hours as needed for nausea or vomiting.   OneTouch Verio test strip Generic drug: glucose blood 1 each by Other route 3 (three) times daily. And lancets 3/day   promethazine 25 MG suppository Commonly known as: PHENERGAN Place 1 suppository (25 mg total) rectally every 6 (six) hours as needed for nausea or vomiting.       No Known Allergies    The results of significant diagnostics from this hospitalization (including imaging, microbiology, ancillary and laboratory) are listed below for reference.    Significant Diagnostic Studies: DG CHEST PORT 1 VIEW  Result Date: 12/28/2020 CLINICAL DATA:  Nausea, vomiting EXAM: PORTABLE CHEST 1 VIEW COMPARISON:  06/10/2020 FINDINGS: Heart and mediastinal contours are within normal limits. No focal opacities or effusions. No acute bony abnormality. IMPRESSION: Negative. Electronically Signed   By: Rolm Baptise M.D.   On: 12/28/2020 18:32    Microbiology: Recent Results (from the past 240 hour(s))  Resp Panel by RT-PCR (Flu A&B, Covid) Nasopharyngeal Swab     Status: None   Collection Time: 12/27/20  6:01 PM   Specimen: Nasopharyngeal Swab; Nasopharyngeal(NP) swabs in vial transport medium  Result Value Ref Range Status   SARS Coronavirus 2 by RT PCR NEGATIVE NEGATIVE Final    Comment: (NOTE) SARS-CoV-2 target nucleic acids are NOT DETECTED.  The SARS-CoV-2 RNA is  generally detectable in upper respiratory specimens during the acute phase of infection. The lowest concentration of SARS-CoV-2 viral copies this assay can detect is 138 copies/mL. A negative result does not preclude SARS-Cov-2 infection and should not be used as the sole basis for treatment or other patient management decisions. A negative result may occur with  improper specimen collection/handling, submission of specimen other than nasopharyngeal swab, presence of viral mutation(s) within the areas targeted by this assay, and inadequate number of viral copies(<138 copies/mL). A negative result must be combined with clinical observations, patient history, and epidemiological information. The expected result is Negative.  Fact Sheet for Patients:  EntrepreneurPulse.com.au  Fact Sheet for Healthcare Providers:  IncredibleEmployment.be  This test is no t yet approved or cleared by the [place] FDA and  has been authorized for detection and/or diagnosis of SARS-CoV-2 by FDA under an Emergency Use Authorization (EUA). This EUA will remain  in effect (meaning this test can be used) for the duration of the COVID-19 declaration under Section 564(b)(1) of the Act, 21 U.S.C.section 360bbb-3(b)(1), unless the authorization is terminated  or revoked sooner.  Influenza A by PCR NEGATIVE NEGATIVE Final   Influenza B by PCR NEGATIVE NEGATIVE Final    Comment: (NOTE) The Xpert Xpress SARS-CoV-2/FLU/RSV plus assay is intended as an aid in the diagnosis of influenza from Nasopharyngeal swab specimens and should not be used as a sole basis for treatment. Nasal washings and aspirates are unacceptable for Xpert Xpress SARS-CoV-2/FLU/RSV testing.  Fact Sheet for Patients: EntrepreneurPulse.com.au  Fact Sheet for Healthcare Providers: IncredibleEmployment.be  This test is not yet approved or cleared by the [place] FDA and has been authorized for detection and/or diagnosis of SARS-CoV-2 by FDA under an Emergency Use Authorization (EUA). This EUA will remain in effect (meaning this test can be used) for the duration of the COVID-19 declaration under Section 564(b)(1) of the Act, 21 U.S.C. section 360bbb-3(b)(1), unless the authorization is terminated or revoked.  Performed at [facility], [place] [address] [place], [place] [address]   Culture, blood (routine x 2)     Status: None (Preliminary result)   Collection Time: 12/28/20  8:32 PM   Specimen: BLOOD LEFT HAND  Result Value Ref Range Status   Specimen Description BLOOD LEFT HAND  Final   Special Requests   Final    BOTTLES DRAWN AEROBIC AND ANAEROBIC Blood Culture adequate volume   Culture   Final    NO GROWTH 3 DAYS Performed at [facility], [place] [address] [place], [place] [address]    Report Status PENDING  Incomplete  Culture, blood (routine x 2)     Status: None (Preliminary result)   Collection Time: 12/29/20  1:59 AM   Specimen: BLOOD RIGHT HAND  Result Value Ref Range Status   Specimen Description BLOOD RIGHT HAND  Final   Special Requests   Final    BOTTLES DRAWN AEROBIC AND ANAEROBIC Blood Culture adequate volume   Culture   Final    NO GROWTH 2 DAYS Performed at [facility], [place] [address] [place], [place] [address]    Report Status PENDING  Incomplete  Culture, body fluid w Gram Stain-bottle     Status: None (Preliminary result)   Collection Time: 12/29/20  5:32 AM   Specimen: Fluid  Result Value Ref Range Status   Specimen Description FLUID PERITONEAL DIALYSIS  Final   Special Requests   Final    BOTTLES DRAWN AEROBIC AND ANAEROBIC Blood Culture adequate volume   Culture   Final    NO GROWTH 2 DAYS Performed at [facility], [place] [address], [place], [place] [address]    Report Status PENDING  Incomplete  Gram stain     Status: None   Collection Time: 12/29/20  5:33 AM   Specimen:  Fluid  Result Value Ref Range Status   Specimen Description FLUID PERITONEAL DIALYSIS  Final   Special Requests NONE  Final   Gram Stain   Final    CYTOSPIN SMEAR WBC PRESENT,BOTH PMN AND MONONUCLEAR NO ORGANISMS SEEN Performed at [facility], [place] [address], [place], [place] [address]    Report Status 12/29/2020 FINAL  Final     Labs: Basic Metabolic Panel: Recent Labs  Lab 12/28/20 0651 12/28/20 0956 12/28/20 2045 12/29/20 0206 12/30/20 0334 12/31/20 0503  NA 135 135 137 136   137 131* 132*  K 3.9 3.9 3.9 3.9   4.0 3.2* 3.2*  CL 93*  --  98 97*   97* 93* 95*  CO2 17*  --  22 22  22 20* 22  GLUCOSE 341*  --  192* 233*   232* 348* 292*  BUN 58*  --  61* 60*   62* 46* 44*  CREATININE 12.04*  --  12.03* 11.61*   11.63* 11.58* 11.48*  CALCIUM 9.6  --  9.1 9.1   9.0 8.9 8.6*  PHOS  --   --   --  9.3* 6.5*  --    Liver Function Tests: Recent Labs  Lab 12/27/20 1449 12/28/20 0651 12/29/20 0206 12/30/20 0334 12/31/20 0503  AST 16 21  --   --  12*  ALT 12 17  --   --  13  ALKPHOS 85 81  --   --  70  BILITOT 0.8 1.0  --   --  0.5  PROT 8.3* 7.9  --   --  7.2  ALBUMIN 4.0 3.8 3.1* 3.3* 3.2*   Recent Labs  Lab 12/27/20 1449 12/28/20 0651  LIPASE 45 42   No results for input(s): AMMONIA in the last 168 hours. CBC: Recent Labs  Lab 12/27/20 1449 12/27/20 1736 12/28/20 0651 12/28/20 0956 12/30/20 0334 12/31/20 0503  WBC 11.4*  --  13.5*  --  10.2 8.8  NEUTROABS 9.2*  --   --   --   --   --   HGB 12.3 13.3 12.2 13.6 12.6 13.0  HCT 38.2 39.0 37.9 40.0 38.4 40.1  MCV 88.6  --  88.6  --  87.1 87.9  PLT 649*  --  625*  --  585* 514*   Cardiac Enzymes: No results for input(s): CKTOTAL, CKMB, CKMBINDEX, TROPONINI in the last 168 hours. BNP: BNP (last 3 results) No results for input(s): BNP in the last 8760 hours.  ProBNP (last 3 results) No results for input(s): PROBNP in the last 8760 hours.  CBG: Recent Labs  Lab 12/30/20 1216 12/30/20 1659  12/30/20 2023 12/30/20 2202 12/31/20 0802  GLUCAP 176* 149* 166* 224* 293*       Signed:  Domenic Polite MD.  [facility] 12/31/2020, 11:33 AM

## 2020-12-31 NOTE — Progress Notes (Signed)
DISCHARGE NOTE* Patient given discharge instructions at this time.  Verbalized understanding of change with Reglan.  IV removed by NT.  Patient taken to lobby via wc for awaiting ride.

## 2020-12-31 NOTE — Progress Notes (Signed)
Subjective: Seen in room, no complaints, tolerating breakfast, tolerated PD 2.5 bags with BP improving with 2 L UF last night  Objective Vital signs in last 24 hours: Vitals:   12/30/20 2025 12/31/20 0448 12/31/20 0705 12/31/20 0723  BP: (!) 145/88 119/86 (!) 145/95 (!) 148/97  Pulse: (!) 105 (!) 106 (!) 102 (!) 107  Resp: 19 18 18 16   Temp: 98.8 F (37.1 C) 98.5 F (36.9 C) 98.5 F (36.9 C) 98 F (36.7 C)  TempSrc: Oral Oral Temporal Oral  SpO2: 97% 98% 97% 98%  Weight:  65.3 kg 63 kg   Height:       Weight change: 5 kg  Physical Exam: General: Alert , pleasant, young adult female ,NAD  Heart: Slightly tacky 106 regular  no MRG Lungs: CTA, nonlabored breathing Abdomen: NABS, soft, NTND  Extremities: No pedal edema Dialysis Access: PD catheter intact dressing dry clear nontender   OP PD orders: Outpatient nephrologist: Dr. Joylene Grapes PD 7x week 5 exchanges, no day dwell, EDW 63kg, Dwell time 1.5hr, fill volume 2L Using 4 exchanges 1.5% dextrose and 1 exchange 2.5% dextrose    Problem/Plan: Intractable nausea vomiting/probable gastroparesis= resolved and PD fluid negative for peritonitis ,plans per  admit with noted discharge today ESRD -continue CCPD, a.m. labs again K3.2 treat with p.o. K, NA 132 CO2 22BUN 44 CR 11.48,  Hypertension/volume  -a.m. BP improving, with 2 L UF on PD (used all 2.5% bags last night )and back on p.o. BP meds  Anemia  -Hgb 10.0 no indication for ESA Metabolic bone disease -corrected calcium okay ,phosphorus 6.5 continue binder p.o. calcitriol 0.25 MCG daily Diabetes mellitus = per admit team Nutrition -ALB 3.3, renal carb modified diet  Ernest Haber, PA-C [facility] [phone] 12/31/2020,9:42 AM  LOS: 2 days   Labs: Basic Metabolic Panel: Recent Labs  Lab 12/29/20 0206 12/30/20 0334 12/31/20 0503  NA 136   137 131* 132*  K 3.9   4.0 3.2* 3.2*  CL 97*   97* 93* 95*  CO2 22   22 20* 22  GLUCOSE 233*   232* 348*  292*  BUN 60*   62* 46* 44*  CREATININE 11.61*   11.63* 11.58* 11.48*  CALCIUM 9.1   9.0 8.9 8.6*  PHOS 9.3* 6.5*  --    Liver Function Tests: Recent Labs  Lab 12/27/20 1449 12/28/20 0651 12/29/20 0206 12/30/20 0334 12/31/20 0503  AST 16 21  --   --  12*  ALT 12 17  --   --  13  ALKPHOS 85 81  --   --  70  BILITOT 0.8 1.0  --   --  0.5  PROT 8.3* 7.9  --   --  7.2  ALBUMIN 4.0 3.8 3.1* 3.3* 3.2*   Recent Labs  Lab 12/27/20 1449 12/28/20 0651  LIPASE 45 42   No results for input(s): AMMONIA in the last 168 hours. CBC: Recent Labs  Lab 12/27/20 1449 12/27/20 1736 12/28/20 0651 12/28/20 0956 12/30/20 0334 12/31/20 0503  WBC 11.4*  --  13.5*  --  10.2 8.8  NEUTROABS 9.2*  --   --   --   --   --   HGB 12.3   < > 12.2 13.6 12.6 13.0  HCT 38.2   < > 37.9 40.0 38.4 40.1  MCV 88.6  --  88.6  --  87.1 87.9  PLT 649*  --  625*  --  585* 514*   < > =  values in this interval not displayed.   Cardiac Enzymes: No results for input(s): CKTOTAL, CKMB, CKMBINDEX, TROPONINI in the last 168 hours. CBG: Recent Labs  Lab 12/30/20 1216 12/30/20 1659 12/30/20 2023 12/30/20 2202 12/31/20 0802  GLUCAP 176* 149* 166* 224* 293*    Studies/Results: No results found. Medications:  dialysis solution 1.5% low-MG/low-CA     dialysis solution 2.5% low-MG/low-CA      amLODipine  10 mg Oral Daily   calcitRIOL  0.25 mcg Oral Daily   carvedilol  25 mg Oral BID   Chlorhexidine Gluconate Cloth  6 each Topical Daily   escitalopram  5 mg Oral Daily   gentamicin cream  1 application Topical Daily   heparin  5,000 Units Subcutaneous Q8H   hydrALAZINE  50 mg Oral TID   insulin aspart  0-6 Units Subcutaneous TID WC   insulin aspart  4 Units Subcutaneous TID WC   insulin glargine-yfgn  30 Units Subcutaneous Daily   losartan  100 mg Oral Daily   metoCLOPramide (REGLAN) injection  5 mg Intravenous TID AC   pantoprazole (PROTONIX) IV  40 mg Intravenous Q24H   potassium chloride  40 mEq  Oral Once   sodium chloride flush  3 mL Intravenous Q12H

## 2020-12-31 NOTE — Plan of Care (Signed)
°  Problem: Education: Goal: Knowledge of General Education information will improve Description: Including pain rating scale, medication(s)/side effects and non-pharmacologic comfort measures Outcome: Adequate for Discharge   Problem: Health Behavior/Discharge Planning: Goal: Ability to manage health-related needs will improve Outcome: Adequate for Discharge   Problem: Activity: Goal: Risk for activity intolerance will decrease Outcome: Adequate for Discharge   Problem: Nutrition: Goal: Adequate nutrition will be maintained Outcome: Adequate for Discharge   Problem: Elimination: Goal: Will not experience complications related to bowel motility Outcome: Adequate for Discharge Goal: Will not experience complications related to urinary retention Outcome: Adequate for Discharge   Problem: Pain Managment: Goal: General experience of comfort will improve Outcome: Adequate for Discharge   Problem: Safety: Goal: Ability to remain free from injury will improve Outcome: Adequate for Discharge   Problem: Skin Integrity: Goal: Risk for impaired skin integrity will decrease Outcome: Adequate for Discharge

## 2021-01-02 LAB — CULTURE, BLOOD (ROUTINE X 2)
Culture: NO GROWTH
Special Requests: ADEQUATE

## 2021-01-03 LAB — CULTURE, BLOOD (ROUTINE X 2)
Culture: NO GROWTH
Special Requests: ADEQUATE

## 2021-01-03 LAB — CULTURE, BODY FLUID W GRAM STAIN -BOTTLE
Culture: NO GROWTH
Special Requests: ADEQUATE

## 2021-01-21 ENCOUNTER — Ambulatory Visit (INDEPENDENT_AMBULATORY_CARE_PROVIDER_SITE_OTHER): Payer: 59 | Admitting: Endocrinology

## 2021-01-21 ENCOUNTER — Other Ambulatory Visit: Payer: Self-pay

## 2021-01-21 VITALS — BP 204/104 | HR 83 | Ht 66.0 in | Wt 150.0 lb

## 2021-01-21 DIAGNOSIS — E10319 Type 1 diabetes mellitus with unspecified diabetic retinopathy without macular edema: Secondary | ICD-10-CM

## 2021-01-21 DIAGNOSIS — E1065 Type 1 diabetes mellitus with hyperglycemia: Secondary | ICD-10-CM | POA: Diagnosis not present

## 2021-01-21 LAB — POCT GLYCOSYLATED HEMOGLOBIN (HGB A1C): Hemoglobin A1C: 6.9 % — AB (ref 4.0–5.6)

## 2021-01-21 NOTE — Patient Instructions (Addendum)
Please see your kidney doctor soon, to recheck the blood pressure.   check your blood sugar 3 times a day: before the 3 meals, and at bedtime.  also check if you have symptoms of your blood sugar being too high or too low.  please keep a record of the readings and bring it to your next appointment here (or you can bring the meter itself).  You can write it on any piece of paper.  please call us sooner if your blood sugar goes below 70, or if you have a lot of readings over 200.   Please continue the same insulins for now.     Please do a blood test today.   Please come back for a follow-up appointment in 3 months.

## 2021-01-21 NOTE — Progress Notes (Signed)
Subjective:    Patient ID: Barbara Donovan, female    DOB: 1990/03/09, 31 y.o.   MRN: [id number]  HPI Pt returns for f/u of diabetes mellitus:  DM type: 1 Dx'ed: 2376 Complications: PDR, GP, and ESRD (on home PD) Therapy: insulin since 2009  DKA: last episode was 2022.  Severe hypoglycemia: once (2022) Pancreatitis: never Pancreatic imaging: never.  Other: she took pump rx 2020-2022; she stopped due to DKA.  plan is to simplify insulin schedule by emphasizing basal; She eats meals at 8AM, 12N, and 6PM; She declines continuous glucose monitor.   Interval history:  pt says cbg varies from 70-250.  She was back in hosp 12/22.  Pt says she does not miss the insulin.   Past Medical History:  Diagnosis Date   Asthma    as a child, no problems as an adult, no inhaler   Cataract    NS OU   Chronic hypertension during pregnancy, antepartum 08/19/2017   Dehydration 01/28/2018   Depression during pregnancy, antepartum 07/07/2017   6/20: Short trial of zoloft previously, reports didn't help much but also didn't give it a chance Discussed r/b/a SSRIs in pregnancy, agrees to try Zoloft again, rx sent No SI/HI/red flags   Diabetes ([place])    TYPE I. A1C 7.5% 05/31/20   Diabetic retinopathy ([place]) 06/09/2017   07/2017 with bilateral severe diabetic non-proliferative retinopathy with macular edema.   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 A2/B GDM)  Required Referrals for A1GDM or A2GDM: [x]  Diabetes Education and Testing Supplies [x]  Nutrition Cousult  For A2/B GDM or higher classes of DM [x]  Diabetes Education and Testing Supplies [x]  Nutrition Counsult [x]  Fetal ECHO after 20 weeks  [x]  Eye exam for retina evaluation - severe retinopathy 7/19  Base   Ventricular septal defect (VSD) of fetus in singleton pregnancy, antepartum 09/30/2017   May go to newborn nursery per Dr. Lenard Simmer Echo prior to discharge    Past Surgical History:  Procedure Laterality Date   25 GAUGE PARS PLANA VITRECTOMY WITH 20 GAUGE MVR PORT FOR MACULAR HOLE Left 07/20/2018   Procedure: 25 GAUGE PARS PLANA VITRECTOMY LEFT EYE;  Surgeon: Bernarda Caffey, MD;  Location: [place];  Service: Ophthalmology;  Laterality: Left;   CAPD INSERTION N/A 09/10/2020   Procedure: LAPAROSCOPIC INSERTION CONTINUOUS AMBULATORY PERITONEAL DIALYSIS  (CAPD) CATHETER;  Surgeon: Serafina Mitchell, MD;  Location: [place];  Service: Vascular;  Laterality: N/A;   CESAREAN SECTION N/A 12/26/2017   Procedure: CESAREAN SECTION;  Surgeon: Osborne Oman, MD;  Location: [place];  Service: Obstetrics;  Laterality: N/A;   EYE EXAMINATION UNDER ANESTHESIA Right 07/20/2018   Procedure: Eye Exam Under Anesthesia RIGHT EYE;  Surgeon: Bernarda Caffey, MD;  Location: [place];  Service: Ophthalmology;  Laterality: Right;  EYE SURGERY Left 07/2018   GAS INSERTION Left 07/19/2019   Procedure: INSERTION OF GAS;  Surgeon: Bernarda Caffey, MD;  Location: [place];  Service: Ophthalmology;  Laterality: Left;  SF6   INJECTION OF SILICONE OIL Left 01/24/6158   Procedure: Injection Of Silicone Oil LEFT EYE;  Surgeon: Bernarda Caffey, MD;  Location: [place];  Service: Ophthalmology;  Laterality: Left;   IR FLUORO GUIDE CV LINE RIGHT  06/02/2020   IR US GUIDE VASC ACCESS RIGHT  06/02/2020   LASER PHOTO ABLATION Right 07/20/2018   Procedure: Laser Photo Ablation  RIGHT EYE;  Surgeon: Bernarda Caffey, MD;  Location: [facility];  Service: Ophthalmology;  Laterality: Right;   MEMBRANE PEEL Left 07/20/2018   Procedure: Membrane Peel LEFT EYE;  Surgeon: Bernarda Caffey, MD;  Location: [place];  Service: Ophthalmology;  Laterality: Left;   MEMBRANE PEEL Left 07/19/2019   Procedure: MEMBRANE PEEL;  Surgeon: Bernarda Caffey, MD;  Location: [place];  Service: Ophthalmology;  Laterality: Left;   MITOMYCIN C APPLICATION Bilateral 07/20/7104   Procedure: Avastin Application;  Surgeon: Bernarda Caffey, MD;  Location: [place];  Service: Ophthalmology;  Laterality: Bilateral;   PHOTOCOAGULATION WITH LASER Left 07/20/2018   Procedure: Photocoagulation With Laser LEFT EYE;  Surgeon: Bernarda Caffey, MD;  Location: [place];  Service: Ophthalmology;  Laterality: Left;   PHOTOCOAGULATION WITH LASER Left 07/19/2019   Procedure: PHOTOCOAGULATION WITH LASER;  Surgeon: Bernarda Caffey, MD;  Location: [place];  Service: Ophthalmology;  Laterality: Left;   RETINAL DETACHMENT SURGERY Left 07/20/2018   Dr. Bernarda Caffey   SILICON OIL REMOVAL Left 07/19/2019   Procedure: 25g PARS PLANA VITRECTOMY WITH SILICON OIL REMOVAL;  Surgeon: Bernarda Caffey, MD;  Location: [place];  Service: Ophthalmology;  Laterality: Left;   WISDOM TOOTH EXTRACTION      Social History   Socioeconomic History   Marital status: Significant Other    Spouse name: Not on file   Number of children: Not on file   Years of education: Not on file   Highest education level: Not on file  Occupational History   Not on file  Tobacco Use   Smoking status: Never   Smokeless tobacco: Never  Vaping Use   Vaping Use: Never used  Substance and Sexual Activity   Alcohol use: Not Currently   Drug use: Never   Sexual activity: Not Currently    Birth control/protection: None  Other Topics Concern   Not on file  Social History Narrative   Not on file   Social Determinants of Health   Financial Resource Strain: Not on file  Food Insecurity: Not on  file  Transportation Needs: Not on file  Physical Activity: Not on file  Stress: Not on file  Social Connections: Not on file  Intimate Partner Violence: Not on file    Current Outpatient Medications on File Prior to Visit  Medication Sig Dispense Refill   acetaminophen (TYLENOL) 500 MG tablet Take 1,000 mg by mouth every 6 (six) hours as needed for mild pain.     amLODipine (NORVASC) 10 MG tablet Take 1 tablet (10 mg total) by mouth daily. 90 tablet 1   calcitRIOL (ROCALTROL) 0.25 MCG capsule Take 0.25 mcg by mouth daily.     carvedilol (COREG) 25 MG tablet Take 1 tablet (25 mg total) by mouth 2 (two) times daily. 180 tablet 3   Continuous Blood Gluc Sensor MISC 1 each by Does not apply route as directed. Use as directed every 14 days. May dispense  FreeStyle [employer] or similar. 1 each 0   escitalopram (LEXAPRO) 5 MG tablet Take 5 mg by mouth daily.     gentamicin cream (GARAMYCIN) 0.1 % Apply 1 application topically daily.     glucose blood (ONETOUCH VERIO) test strip 1 each by Other route 3 (three) times daily. And lancets 3/day 300 each 3   hydrALAZINE (APRESOLINE) 50 MG tablet Take 1 tablet (50 mg total) by mouth 3 (three) times daily.     insulin glargine (LANTUS SOLOSTAR) 100 UNIT/ML Solostar Pen Inject 20 Units into the skin every morning. 15 mL 0   insulin lispro (HUMALOG KWIKPEN) 100 UNIT/ML KwikPen Inject 3 Units into the skin 3 (three) times daily with meals. 15 mL 3   losartan (COZAAR) 50 MG tablet Take 2 tablets (100 mg total) by mouth daily. 60 tablet 0   multivitamin (RENA-VIT) TABS tablet Take 1 tablet by mouth at bedtime. 90 tablet 1   ondansetron (ZOFRAN ODT) 4 MG disintegrating tablet Take 1 tablet (4 mg total) by mouth every 8 (eight) hours as needed for nausea or vomiting. 20 tablet 0   promethazine (PHENERGAN) 25 MG suppository Place 1 suppository (25 mg total) rectally every 6 (six) hours as needed for nausea or vomiting. 12 each 0   metoCLOPramide (REGLAN)  5 MG tablet Take 1 tablet (5 mg total) by mouth 3 (three) times daily before meals for 3 days.     No current facility-administered medications on file prior to visit.    No Known Allergies  Family History  Problem Relation Age of Onset   Diabetes Mother    Aneurysm Mother 55   Seizures Mother    Diabetes Father    Cataracts Father    COPD Father    Heart attack Father    Heart disease Father    Healthy Sister    Healthy Daughter    Stroke Maternal Grandfather    Amblyopia Neg Hx    Blindness Neg Hx    Glaucoma Neg Hx    Macular degeneration Neg Hx    Retinal detachment Neg Hx    Strabismus Neg Hx    Retinitis pigmentosa Neg Hx    Colon cancer Neg Hx    Stomach cancer Neg Hx    Esophageal cancer Neg Hx    Pancreatic cancer Neg Hx    Liver disease Neg Hx     BP (!) 204/104    Pulse 83    Ht 5\' 6"  (1.676 m)    Wt 150 lb (68 kg)    SpO2 98%    BMI 24.21 kg/m    Review of Systems     Objective:   Physical Exam    Lab Results  Component Value Date   HGBA1C 6.9 (A) 01/21/2021   Lab Results  Component Value Date   CREATININE 11.48 (H) 12/31/2020   BUN 44 (H) 12/31/2020   NA 132 (L) 12/31/2020   K 3.2 (L) 12/31/2020   CL 95 (L) 12/31/2020   CO2 22 12/31/2020       Assessment & Plan:  Type 1 DM ESRD: check fructosamine Hypoglycemia, due to insulin.   Patient Instructions  Please see your kidney doctor soon, to recheck the blood pressure.   check your blood sugar 3 times a day: before the 3 meals, and at bedtime.  also check if you have symptoms of your blood sugar being too high or too low.  please keep a record of the readings and  bring it to your next appointment here (or you can bring the meter itself).  You can write it on any piece of paper.  please call us sooner if your blood sugar goes below 70, or if you have a lot of readings over 200.   Please continue the same insulins for now.     Please do a blood test today.   Please come back for a  follow-up appointment in 3 months.

## 2021-01-27 LAB — FRUCTOSAMINE: Fructosamine: 396 umol/L — ABNORMAL HIGH (ref 205–285)

## 2021-02-04 ENCOUNTER — Other Ambulatory Visit: Payer: Self-pay

## 2021-02-04 ENCOUNTER — Emergency Department (HOSPITAL_COMMUNITY): Payer: 59

## 2021-02-04 ENCOUNTER — Observation Stay (HOSPITAL_COMMUNITY)
Admission: EM | Admit: 2021-02-04 | Discharge: 2021-02-05 | Disposition: A | Discharge status: Home / Self Care | Payer: 59 | Attending: Internal Medicine | Admitting: Internal Medicine

## 2021-02-04 DIAGNOSIS — K3184 Gastroparesis: Secondary | ICD-10-CM | POA: Diagnosis not present

## 2021-02-04 DIAGNOSIS — N186 End stage renal disease: Secondary | ICD-10-CM | POA: Diagnosis not present

## 2021-02-04 DIAGNOSIS — Z794 Long term (current) use of insulin: Secondary | ICD-10-CM | POA: Insufficient documentation

## 2021-02-04 DIAGNOSIS — R0902 Hypoxemia: Secondary | ICD-10-CM | POA: Diagnosis not present

## 2021-02-04 DIAGNOSIS — Z818 Family history of other mental and behavioral disorders: Secondary | ICD-10-CM | POA: Insufficient documentation

## 2021-02-04 DIAGNOSIS — Z8249 Family history of ischemic heart disease and other diseases of the circulatory system: Secondary | ICD-10-CM | POA: Insufficient documentation

## 2021-02-04 DIAGNOSIS — E0865 Diabetes mellitus due to underlying condition with hyperglycemia: Secondary | ICD-10-CM

## 2021-02-04 DIAGNOSIS — R000 Tachycardia, unspecified: Secondary | ICD-10-CM | POA: Diagnosis not present

## 2021-02-04 DIAGNOSIS — E1043 Type 1 diabetes mellitus with diabetic autonomic (poly)neuropathy: Secondary | ICD-10-CM | POA: Diagnosis not present

## 2021-02-04 DIAGNOSIS — E860 Dehydration: Secondary | ICD-10-CM | POA: Diagnosis not present

## 2021-02-04 DIAGNOSIS — J45909 Unspecified asthma, uncomplicated: Secondary | ICD-10-CM | POA: Insufficient documentation

## 2021-02-04 DIAGNOSIS — R112 Nausea with vomiting, unspecified: Principal | ICD-10-CM

## 2021-02-04 DIAGNOSIS — R39198 Other difficulties with micturition: Secondary | ICD-10-CM | POA: Insufficient documentation

## 2021-02-04 DIAGNOSIS — E1022 Type 1 diabetes mellitus with diabetic chronic kidney disease: Secondary | ICD-10-CM | POA: Diagnosis not present

## 2021-02-04 DIAGNOSIS — E8729 Other acidosis: Secondary | ICD-10-CM | POA: Diagnosis present

## 2021-02-04 DIAGNOSIS — F32A Depression, unspecified: Secondary | ICD-10-CM | POA: Insufficient documentation

## 2021-02-04 DIAGNOSIS — R10819 Abdominal tenderness, unspecified site: Secondary | ICD-10-CM | POA: Diagnosis not present

## 2021-02-04 DIAGNOSIS — Z833 Family history of diabetes mellitus: Secondary | ICD-10-CM | POA: Insufficient documentation

## 2021-02-04 DIAGNOSIS — R9431 Abnormal electrocardiogram [ECG] [EKG]: Secondary | ICD-10-CM | POA: Insufficient documentation

## 2021-02-04 DIAGNOSIS — Z20822 Contact with and (suspected) exposure to covid-19: Secondary | ICD-10-CM | POA: Diagnosis not present

## 2021-02-04 DIAGNOSIS — R5383 Other fatigue: Secondary | ICD-10-CM | POA: Insufficient documentation

## 2021-02-04 DIAGNOSIS — E871 Hypo-osmolality and hyponatremia: Secondary | ICD-10-CM | POA: Insufficient documentation

## 2021-02-04 DIAGNOSIS — R1110 Vomiting, unspecified: Secondary | ICD-10-CM

## 2021-02-04 DIAGNOSIS — F419 Anxiety disorder, unspecified: Secondary | ICD-10-CM | POA: Diagnosis not present

## 2021-02-04 DIAGNOSIS — E876 Hypokalemia: Secondary | ICD-10-CM | POA: Diagnosis not present

## 2021-02-04 DIAGNOSIS — Z79899 Other long term (current) drug therapy: Secondary | ICD-10-CM | POA: Insufficient documentation

## 2021-02-04 DIAGNOSIS — E103413 Type 1 diabetes mellitus with severe nonproliferative diabetic retinopathy with macular edema, bilateral: Secondary | ICD-10-CM | POA: Insufficient documentation

## 2021-02-04 DIAGNOSIS — Z992 Dependence on renal dialysis: Secondary | ICD-10-CM | POA: Diagnosis not present

## 2021-02-04 DIAGNOSIS — I120 Hypertensive chronic kidney disease with stage 5 chronic kidney disease or end stage renal disease: Secondary | ICD-10-CM | POA: Insufficient documentation

## 2021-02-04 DIAGNOSIS — H35033 Hypertensive retinopathy, bilateral: Secondary | ICD-10-CM | POA: Insufficient documentation

## 2021-02-04 DIAGNOSIS — F439 Reaction to severe stress, unspecified: Secondary | ICD-10-CM | POA: Insufficient documentation

## 2021-02-04 DIAGNOSIS — R42 Dizziness and giddiness: Secondary | ICD-10-CM | POA: Insufficient documentation

## 2021-02-04 DIAGNOSIS — E1010 Type 1 diabetes mellitus with ketoacidosis without coma: Secondary | ICD-10-CM | POA: Diagnosis not present

## 2021-02-04 DIAGNOSIS — I10 Essential (primary) hypertension: Secondary | ICD-10-CM | POA: Diagnosis present

## 2021-02-04 DIAGNOSIS — R109 Unspecified abdominal pain: Secondary | ICD-10-CM | POA: Insufficient documentation

## 2021-02-04 DIAGNOSIS — D631 Anemia in chronic kidney disease: Secondary | ICD-10-CM | POA: Insufficient documentation

## 2021-02-04 DIAGNOSIS — H2513 Age-related nuclear cataract, bilateral: Secondary | ICD-10-CM | POA: Insufficient documentation

## 2021-02-04 DIAGNOSIS — E10319 Type 1 diabetes mellitus with unspecified diabetic retinopathy without macular edema: Secondary | ICD-10-CM

## 2021-02-04 DIAGNOSIS — N2581 Secondary hyperparathyroidism of renal origin: Secondary | ICD-10-CM | POA: Insufficient documentation

## 2021-02-04 DIAGNOSIS — E1036 Type 1 diabetes mellitus with diabetic cataract: Secondary | ICD-10-CM | POA: Insufficient documentation

## 2021-02-04 DIAGNOSIS — E1143 Type 2 diabetes mellitus with diabetic autonomic (poly)neuropathy: Secondary | ICD-10-CM | POA: Diagnosis present

## 2021-02-04 DIAGNOSIS — E1065 Type 1 diabetes mellitus with hyperglycemia: Secondary | ICD-10-CM | POA: Insufficient documentation

## 2021-02-04 LAB — COMPREHENSIVE METABOLIC PANEL
ALT: 12 U/L (ref 0–44)
AST: 10 U/L — ABNORMAL LOW (ref 15–41)
Albumin: 4.3 g/dL (ref 3.5–5.0)
Alkaline Phosphatase: 65 U/L (ref 38–126)
Anion gap: 29 — ABNORMAL HIGH (ref 5–15)
BUN: 62 mg/dL — ABNORMAL HIGH (ref 6–20)
CO2: 18 mmol/L — ABNORMAL LOW (ref 22–32)
Calcium: 9.9 mg/dL (ref 8.9–10.3)
Chloride: 84 mmol/L — ABNORMAL LOW (ref 98–111)
Creatinine, Ser: 15.12 mg/dL — ABNORMAL HIGH (ref 0.44–1.00)
GFR, Estimated: 3 mL/min — ABNORMAL LOW (ref >60)
Glucose, Bld: 255 mg/dL — ABNORMAL HIGH (ref 70–99)
Potassium: 3.3 mmol/L — ABNORMAL LOW (ref 3.5–5.1)
Sodium: 131 mmol/L — ABNORMAL LOW (ref 135–145)
Total Bilirubin: 0.6 mg/dL (ref 0.3–1.2)
Total Protein: 8.8 g/dL — ABNORMAL HIGH (ref 6.5–8.1)

## 2021-02-04 LAB — I-STAT VENOUS BLOOD GAS, ED
Acid-Base Excess: 3 mmol/L — ABNORMAL HIGH (ref 0.0–2.0)
Bicarbonate: 24.3 mmol/L (ref 20.0–28.0)
Calcium, Ion: 0.95 mmol/L — ABNORMAL LOW (ref 1.15–1.40)
HCT: 50 % — ABNORMAL HIGH (ref 36.0–46.0)
Hemoglobin: 17 g/dL — ABNORMAL HIGH (ref 12.0–15.0)
O2 Saturation: 86 %
Potassium: 3.4 mmol/L — ABNORMAL LOW (ref 3.5–5.1)
Sodium: 130 mmol/L — ABNORMAL LOW (ref 135–145)
TCO2: 25 mmol/L (ref 22–32)
pCO2, Ven: 28.6 mmHg — ABNORMAL LOW (ref 44.0–60.0)
pH, Ven: 7.536 — ABNORMAL HIGH (ref 7.250–7.430)
pO2, Ven: 44 mmHg (ref 32.0–45.0)

## 2021-02-04 LAB — I-STAT BETA HCG BLOOD, ED (MC, WL, AP ONLY): I-stat hCG, quantitative: <5 m[IU]/mL (ref <5)

## 2021-02-04 LAB — CBG MONITORING, ED: Glucose-Capillary: 281 mg/dL — ABNORMAL HIGH (ref 70–99)

## 2021-02-04 LAB — LIPASE, BLOOD: Lipase: 60 U/L — ABNORMAL HIGH (ref 11–51)

## 2021-02-04 LAB — TSH: TSH: 1.37 u[IU]/mL (ref 0.350–4.500)

## 2021-02-04 LAB — BODY FLUID CELL COUNT WITH DIFFERENTIAL
Eos, Fluid: 1 %
Lymphs, Fluid: 15 %
Monocyte-Macrophage-Serous Fluid: 71 % (ref 50–90)
Neutrophil Count, Fluid: 13 % (ref 0–25)
Total Nucleated Cell Count, Fluid: 58 cu mm (ref 0–1000)

## 2021-02-04 LAB — CBC
HCT: 48.4 % — ABNORMAL HIGH (ref 36.0–46.0)
Hemoglobin: 15.8 g/dL — ABNORMAL HIGH (ref 12.0–15.0)
MCH: 27.8 pg (ref 26.0–34.0)
MCHC: 32.6 g/dL (ref 30.0–36.0)
MCV: 85.1 fL (ref 80.0–100.0)
Platelets: 498 10*3/uL — ABNORMAL HIGH (ref 150–400)
RBC: 5.69 MIL/uL — ABNORMAL HIGH (ref 3.87–5.11)
RDW: 12.7 % (ref 11.5–15.5)
WBC: 6.1 10*3/uL (ref 4.0–10.5)
nRBC: 0 % (ref 0.0–0.2)

## 2021-02-04 LAB — PHOSPHORUS: Phosphorus: 11.8 mg/dL — ABNORMAL HIGH (ref 2.5–4.6)

## 2021-02-04 LAB — BETA-HYDROXYBUTYRIC ACID: Beta-Hydroxybutyric Acid: 0.68 mmol/L — ABNORMAL HIGH (ref 0.05–0.27)

## 2021-02-04 LAB — GLUCOSE, CAPILLARY
Glucose-Capillary: 119 mg/dL — ABNORMAL HIGH (ref 70–99)
Glucose-Capillary: 100 mg/dL — ABNORMAL HIGH (ref 70–99)

## 2021-02-04 LAB — RESP PANEL BY RT-PCR (FLU A&B, COVID) ARPGX2
Influenza A by PCR: NEGATIVE
Influenza B by PCR: NEGATIVE
SARS Coronavirus 2 by RT PCR: NEGATIVE

## 2021-02-04 LAB — MAGNESIUM: Magnesium: 2.4 mg/dL (ref 1.7–2.4)

## 2021-02-04 LAB — HIV ANTIBODY (ROUTINE TESTING W REFLEX): HIV Screen 4th Generation wRfx: NONREACTIVE

## 2021-02-04 IMAGING — DX DG CHEST 1V PORT
1 series · 1 of 1 positions shown · non-contrast
Comparison: [DATE].

CLINICAL DATA: Nausea and vomiting.  Hypoxia.

EXAM:
PORTABLE CHEST 1 VIEW

[chest ap]
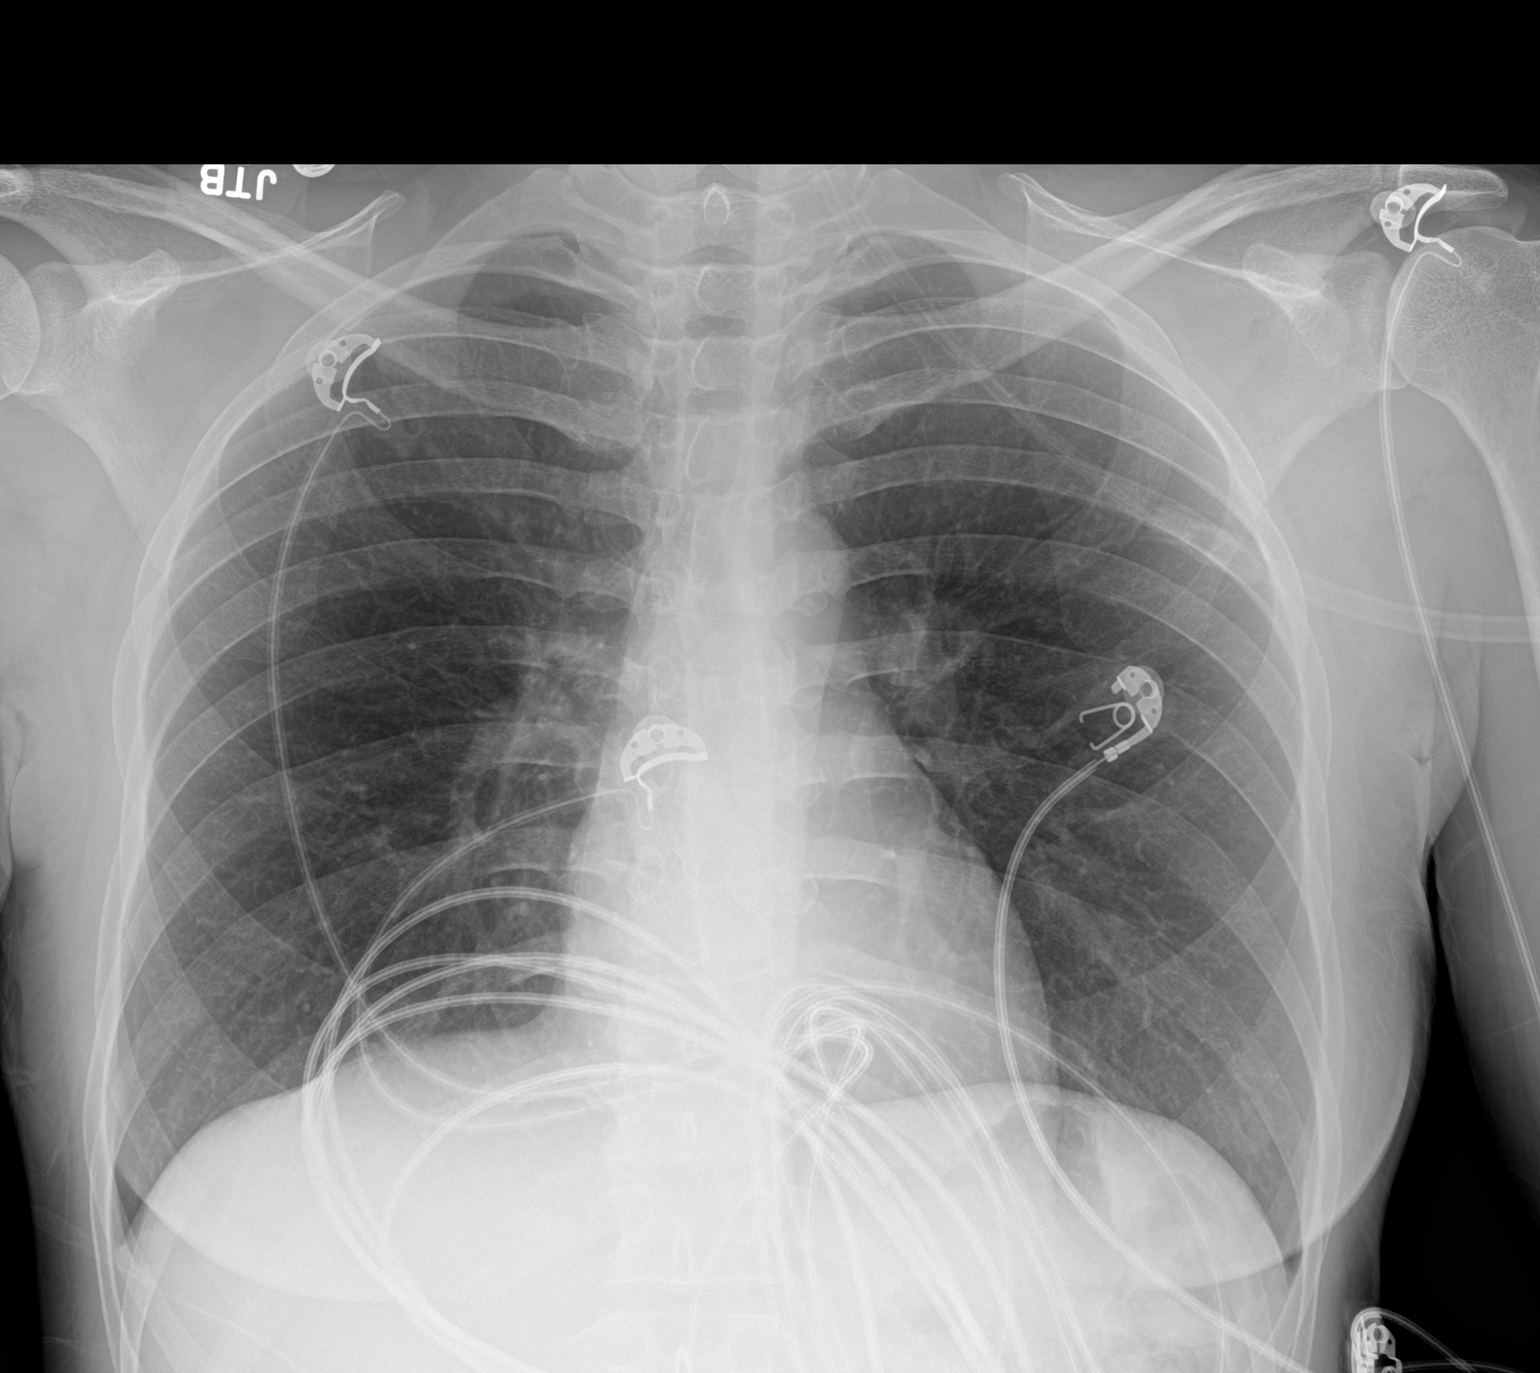

[1 of 1 positions shown; findings below may reference images not displayed]

FINDINGS: Trachea is midline. Heart size normal. Lungs are clear. No pleural
fluid.
IMPRESSION: No acute findings.

## 2021-02-04 MED ORDER — DELFLEX-LC/4.25% DEXTROSE 483 MOSM/L IP SOLN: INTRAPERITONEAL | Status: DC

## 2021-02-04 MED ORDER — ONDANSETRON HCL 4 MG/2ML IJ SOLN: 4.0000 mg | Freq: Once | INTRAMUSCULAR | Status: DC

## 2021-02-04 MED ORDER — METOCLOPRAMIDE HCL 5 MG/ML IJ SOLN
5.0000 mg | Freq: Four times a day (QID) | INTRAMUSCULAR | Status: DC | PRN
  Administered 2021-02-04: 5 mg via INTRAVENOUS
  Filled 2021-02-04: qty 2

## 2021-02-04 MED ORDER — AMLODIPINE BESYLATE 5 MG PO TABS
10.0000 mg | ORAL_TABLET | Freq: Every day | ORAL | Status: DC
  Administered 2021-02-04 – 2021-02-05 (×2): 10 mg via ORAL
  Filled 2021-02-04 (×2): qty 2

## 2021-02-04 MED ORDER — CLONAZEPAM 0.5 MG PO TABS
0.5000 mg | ORAL_TABLET | Freq: Two times a day (BID) | ORAL | Status: DC
  Administered 2021-02-04: 0.5 mg via ORAL
  Filled 2021-02-04: qty 1

## 2021-02-04 MED ORDER — ACETAMINOPHEN 650 MG RE SUPP: 650.0000 mg | Freq: Four times a day (QID) | RECTAL | Status: DC | PRN

## 2021-02-04 MED ORDER — LORAZEPAM 2 MG/ML IJ SOLN
0.5000 mg | Freq: Once | INTRAMUSCULAR | Status: AC
Start: 2021-02-04 — End: 2021-02-04
  Administered 2021-02-04: 0.5 mg via INTRAVENOUS
  Filled 2021-02-04: qty 1

## 2021-02-04 MED ORDER — SODIUM CHLORIDE 0.9% FLUSH: 3.0000 mL | INTRAVENOUS | Status: DC | PRN

## 2021-02-04 MED ORDER — GENTAMICIN SULFATE 0.1 % EX CREA
1.0000 "application " | TOPICAL_CREAM | Freq: Every day | CUTANEOUS | Status: DC
  Administered 2021-02-04 – 2021-02-05 (×2): 1 via TOPICAL
  Filled 2021-02-04: qty 15

## 2021-02-04 MED ORDER — DELFLEX-LC/2.5% DEXTROSE 394 MOSM/L IP SOLN: INTRAPERITONEAL | Status: DC

## 2021-02-04 MED ORDER — SODIUM CHLORIDE 0.9 % IV BOLUS
500.0000 mL | Freq: Once | INTRAVENOUS | Status: AC
  Administered 2021-02-04: 500 mL via INTRAVENOUS

## 2021-02-04 MED ORDER — INSULIN GLARGINE-YFGN 100 UNIT/ML ~~LOC~~ SOLN
20.0000 [IU] | Freq: Every day | SUBCUTANEOUS | Status: DC
  Filled 2021-02-04: qty 0.2

## 2021-02-04 MED ORDER — HYDRALAZINE HCL 20 MG/ML IJ SOLN: 5.0000 mg | Freq: Four times a day (QID) | INTRAMUSCULAR | Status: DC | PRN

## 2021-02-04 MED ORDER — INSULIN GLARGINE 100 UNIT/ML ~~LOC~~ SOLN
20.0000 [IU] | Freq: Every day | SUBCUTANEOUS | Status: DC
  Filled 2021-02-04: qty 0.2

## 2021-02-04 MED ORDER — INSULIN GLARGINE-YFGN 100 UNIT/ML ~~LOC~~ SOLN
10.0000 [IU] | Freq: Every day | SUBCUTANEOUS | Status: DC
  Administered 2021-02-05: 10 [IU] via SUBCUTANEOUS
  Filled 2021-02-04 (×2): qty 0.1

## 2021-02-04 MED ORDER — METOCLOPRAMIDE HCL 5 MG/ML IJ SOLN: 5.0000 mg | Freq: Four times a day (QID) | INTRAMUSCULAR | Status: DC | PRN

## 2021-02-04 MED ORDER — SODIUM CHLORIDE 0.9% FLUSH
3.0000 mL | Freq: Two times a day (BID) | INTRAVENOUS | Status: DC
  Administered 2021-02-04 – 2021-02-05 (×3): 3 mL via INTRAVENOUS

## 2021-02-04 MED ORDER — CHLORHEXIDINE GLUCONATE CLOTH 2 % EX PADS
6.0000 | MEDICATED_PAD | Freq: Every day | CUTANEOUS | Status: DC
  Administered 2021-02-05: 6 via TOPICAL

## 2021-02-04 MED ORDER — HEPARIN SODIUM (PORCINE) 5000 UNIT/ML IJ SOLN
5000.0000 [IU] | Freq: Three times a day (TID) | INTRAMUSCULAR | Status: DC
  Administered 2021-02-04 – 2021-02-05 (×4): 5000 [IU] via SUBCUTANEOUS
  Filled 2021-02-04 (×4): qty 1

## 2021-02-04 MED ORDER — INSULIN ASPART 100 UNIT/ML IJ SOLN
0.0000 [IU] | INTRAMUSCULAR | Status: DC
  Administered 2021-02-05 (×4): 1 [IU] via SUBCUTANEOUS

## 2021-02-04 MED ORDER — ACETAMINOPHEN 325 MG PO TABS: 650.0000 mg | ORAL_TABLET | Freq: Four times a day (QID) | ORAL | Status: DC | PRN

## 2021-02-04 MED ORDER — CALCITRIOL 0.25 MCG PO CAPS
0.2500 ug | ORAL_CAPSULE | Freq: Every day | ORAL | Status: DC
  Administered 2021-02-04 – 2021-02-05 (×2): 0.25 ug via ORAL
  Filled 2021-02-04 (×2): qty 1

## 2021-02-04 MED ORDER — INSULIN GLARGINE 100 UNIT/ML SOLOSTAR PEN: 20.0000 [IU] | PEN_INJECTOR | SUBCUTANEOUS | Status: DC

## 2021-02-04 MED ORDER — INSULIN ASPART 100 UNIT/ML IJ SOLN: 0.0000 [IU] | Freq: Three times a day (TID) | INTRAMUSCULAR | Status: DC

## 2021-02-04 MED ORDER — DELFLEX-LC/1.5% DEXTROSE 344 MOSM/L IP SOLN
INTRAPERITONEAL | Status: DC
  Administered 2021-02-04: 5000 mL via INTRAPERITONEAL

## 2021-02-04 MED ORDER — LORAZEPAM 0.5 MG PO TABS
0.5000 mg | ORAL_TABLET | Freq: Two times a day (BID) | ORAL | Status: DC | PRN
  Administered 2021-02-04: 0.5 mg via ORAL
  Filled 2021-02-04: qty 1

## 2021-02-04 MED ORDER — CARVEDILOL 12.5 MG PO TABS
25.0000 mg | ORAL_TABLET | Freq: Two times a day (BID) | ORAL | Status: DC
  Administered 2021-02-04 – 2021-02-05 (×2): 25 mg via ORAL
  Filled 2021-02-04 (×2): qty 2

## 2021-02-04 MED ORDER — SODIUM CHLORIDE 0.9 % IV SOLN: 250.0000 mL | INTRAVENOUS | Status: DC | PRN

## 2021-02-04 NOTE — Consult Note (Signed)
ESRD Consult Note  Requesting provider: Orma Flaming Service requesting consult: Hospitalist Reason for consult: ESRD, provision of dialysis Indication for acute dialysis?: End Stage Renal Disease  Outpatient dialysis unit: [facility] home Outpatient dialysis schedule: Daily PD Script: CCPD 7x week, 5 cycles, 2000 fill, dwell 1.5hrs, EDW 63, no day dwell Receiving iron, no mircera PTH 909 and phos 9 on 1/3  Assessment/Recommendations: Barbara Donovan is a/an 31 y.o. female with a past medical history notable for ESRD on HD admitted with nausea/vomiting.   # ESRD: PD per script as above. All 1.5% bags given dehydration # Volume/ hypertension: Appears dehydrated, agree with IV fluids.   EDW 63 kg.  Blood pressures slightly elevated at this time.  Home medications include coreg 25mg  bid, losartan 50mg  daily, hydral 50mg  bid, norvasc 10mg  daily # Anemia of Chronic Kidney Disease: Baseline hemoglobin typically around 12.  Hemoglobin 17 here likely related to hemoconcentration.  Agree with hydration.  No ESA # Secondary Hyperparathyroidism/Hyperphosphatemia: On calcitriol 0.25 MCG daily but recent PTH was 900.  Will increase to 0.42mcg daily and f/u phos. Not currently on binder. # Vascular access: PD access working well. # Nausea/Vomiting: possibly anxiety playing a big role. Agree with aggressive management of this. May benefit from psych involvement # Tachycardia: likely dehydration and anxiety. CTM  # Additional recommendations: - Dose all meds for creatinine clearance < 10 ml/min  - Unless absolutely necessary, no MRIs with gadolinium.  - Implement save arm precautions.  Prefer needle sticks in the dorsum of the hands or wrists.  No blood pressure measurements in arm. - If blood transfusion is requested during hemodialysis sessions, please alert [name] prior to the session.   Recommendations were discussed with the primary team.   History of Present Illness: Barbara Donovan is a/an 31 y.o.  female with a past medical history of ESRD who presents with nausea/emesis.  The patient states that she has had intermittent episodes of nausea over the past few weeks but over the last few days it is gotten a lot worse.  Now having a lot of emesis and difficulties keeping down her medications.  No blood in her emesis.  Denies fevers or chills.  No chest pain, shortness of breath, diarrhea.  She continues to void small amounts with no blood or pain with urination.  No cough.  No other viral symptoms.  Has been undergoing peritoneal dialysis without significant issues.  She does feel like she is very dehydrated because she has not been able to take anything down. No significant abd pain or cloudy effluent.  She had 1 episode of possible hypoxia in the emergency department but that has resolved.  She admits to being under a lot of stress.  Her son is about to have surgery on his hands.  She thinks that stress has had a lot to do with her previous episodes of nausea and vomiting as well.  The diagnosis for her nausea and vomiting has been somewhat elusive.  Some concern for gastroparesis but gastroenterology work-up was negative.   Medications:  Current Facility-Administered Medications  Medication Dose Route Frequency Provider Last Rate Last Admin   0.9 %  sodium chloride infusion  250 mL Intravenous PRN Orma Flaming, MD       acetaminophen (TYLENOL) tablet 650 mg  650 mg Oral Q6H PRN Orma Flaming, MD       Or   acetaminophen (TYLENOL) suppository 650 mg  650 mg Rectal Q6H PRN Orma Flaming, MD  clonazePAM (KLONOPIN) tablet 0.5 mg  0.5 mg Oral BID Orma Flaming, MD       heparin injection 5,000 Units  5,000 Units Subcutaneous Q8H Orma Flaming, MD       hydrALAZINE (APRESOLINE) injection 5 mg  5 mg Intravenous Q6H PRN Orma Flaming, MD       metoCLOPramide (REGLAN) injection 5 mg  5 mg Intravenous Q6H PRN Orma Flaming, MD       sodium chloride 0.9 % bolus 500 mL  500 mL Intravenous  Once Orma Flaming, MD       sodium chloride flush (NS) 0.9 % injection 3 mL  3 mL Intravenous Q12H Orma Flaming, MD       sodium chloride flush (NS) 0.9 % injection 3 mL  3 mL Intravenous PRN Orma Flaming, MD       Current Outpatient Medications  Medication Sig Dispense Refill   acetaminophen (TYLENOL) 500 MG tablet Take 1,000 mg by mouth every 6 (six) hours as needed for mild pain.     amLODipine (NORVASC) 10 MG tablet Take 1 tablet (10 mg total) by mouth daily. 90 tablet 1   calcitRIOL (ROCALTROL) 0.25 MCG capsule Take 0.25 mcg by mouth daily.     carvedilol (COREG) 25 MG tablet Take 1 tablet (25 mg total) by mouth 2 (two) times daily. 180 tablet 3   Continuous Blood Gluc Sensor MISC 1 each by Does not apply route as directed. Use as directed every 14 days. May dispense FreeStyle [employer] or similar. 1 each 0   escitalopram (LEXAPRO) 5 MG tablet Take 5 mg by mouth daily.     gentamicin cream (GARAMYCIN) 0.1 % Apply 1 application topically daily.     glucose blood (ONETOUCH VERIO) test strip 1 each by Other route 3 (three) times daily. And lancets 3/day 300 each 3   hydrALAZINE (APRESOLINE) 50 MG tablet Take 1 tablet (50 mg total) by mouth 3 (three) times daily.     insulin glargine (LANTUS SOLOSTAR) 100 UNIT/ML Solostar Pen Inject 20 Units into the skin every morning. 15 mL 0   insulin lispro (HUMALOG KWIKPEN) 100 UNIT/ML KwikPen Inject 3 Units into the skin 3 (three) times daily with meals. 15 mL 3   losartan (COZAAR) 50 MG tablet Take 2 tablets (100 mg total) by mouth daily. 60 tablet 0   metoCLOPramide (REGLAN) 5 MG tablet Take 1 tablet (5 mg total) by mouth 3 (three) times daily before meals for 3 days.     multivitamin (RENA-VIT) TABS tablet Take 1 tablet by mouth at bedtime. 90 tablet 1   ondansetron (ZOFRAN ODT) 4 MG disintegrating tablet Take 1 tablet (4 mg total) by mouth every 8 (eight) hours as needed for nausea or vomiting. 20 tablet 0   promethazine (PHENERGAN) 25  MG suppository Place 1 suppository (25 mg total) rectally every 6 (six) hours as needed for nausea or vomiting. 12 each 0     ALLERGIES Patient has no known allergies.  MEDICAL HISTORY Past Medical History:  Diagnosis Date   Asthma    as a child, no problems as an adult, no inhaler   Cataract    NS OU   Chronic hypertension during pregnancy, antepartum 08/19/2017   Dehydration 01/28/2018   Depression during pregnancy, antepartum 07/07/2017   6/20: Short trial of zoloft previously, reports didn't help much but also didn't give it a chance Discussed r/b/a SSRIs in pregnancy, agrees to try Zoloft again, rx sent No SI/HI/red flags  Diabetes ([place])    TYPE I. A1C 7.5% 05/31/20   Diabetic retinopathy ([place]) 06/09/2017   07/2017 with bilateral severe diabetic non-proliferative retinopathy with macular edema.   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 A2/B GDM)  Required Referrals for A1GDM or A2GDM: [x]  Diabetes Education and Testing Supplies [x]  Nutrition Cousult  For A2/B GDM or higher classes of DM [x]  Diabetes Education and Testing Supplies [x]  Nutrition Counsult [x]  Fetal ECHO after 20 weeks  [x]  Eye exam for retina evaluation - severe retinopathy 7/19  Base   Ventricular septal defect (VSD) of fetus in singleton pregnancy, antepartum 09/30/2017   May go to newborn nursery  per Dr. Lenard Simmer Echo prior to discharge     SOCIAL HISTORY Social History   Socioeconomic History   Marital status: Significant Other    Spouse name: Not on file   Number of children: Not on file   Years of education: Not on file   Highest education level: Not on file  Occupational History   Not on file  Tobacco Use   Smoking status: Never   Smokeless tobacco: Never  Vaping Use   Vaping Use: Never used  Substance and Sexual Activity   Alcohol use: Not Currently   Drug use: Never   Sexual activity: Not Currently    Birth control/protection: None  Other Topics Concern   Not on file  Social History Narrative   Not on file   Social Determinants of Health   Financial Resource Strain: Not on file  Food Insecurity: Not on file  Transportation Needs: Not on file  Physical Activity: Not on file  Stress: Not on file  Social Connections: Not on file  Intimate Partner Violence: Not on file     FAMILY HISTORY Family History  Problem Relation Age of Onset   Diabetes Mother    Aneurysm Mother 70   Seizures Mother    Diabetes Father    Cataracts Father    COPD Father    Heart attack Father    Heart disease Father    Healthy Sister    Healthy Daughter    Stroke Maternal Grandfather    Amblyopia Neg Hx    Blindness Neg Hx    Glaucoma Neg Hx    Macular degeneration Neg Hx    Retinal detachment Neg Hx    Strabismus Neg Hx    Retinitis pigmentosa Neg Hx    Colon cancer Neg Hx    Stomach cancer Neg Hx    Esophageal cancer Neg Hx    Pancreatic cancer Neg Hx    Liver disease Neg Hx      Review of Systems: 12 systems were reviewed and negative except per HPI  Physical Exam: Vitals:   02/04/21 0930 02/04/21 1030  BP: (!) 149/102 (!) 149/97  Pulse:  98 (!) 103  Resp: (!) 23 13  Temp:    SpO2: 91% 100%   No intake/output data recorded. No intake or output data in the 24 hours ending 02/04/21 1350 General: well-appearing, no acute distress HEENT: anicteric sclera,  MMM CV: tachycardia, no murmurs, no edema Lungs: bilateral chest rise, normal wob Abd: soft, non-tender, non-distended, PD cath in place Skin: no visible lesions or rashes Psych: alert, engaged, appropriate mood and affect Neuro: normal speech, no gross focal deficits   Test Results Reviewed Lab Results  Component Value Date   NA 130 (L) 02/04/2021   K 3.4 (L) 02/04/2021   CL 84 (L) 02/04/2021   CO2 18 (L) 02/04/2021   BUN 62 (H) 02/04/2021   CREATININE 15.12 (H) 02/04/2021   GFR 15.72 (L) 12/21/2019   CALCIUM 9.9 02/04/2021   ALBUMIN 4.3 02/04/2021   PHOS 6.5 (H) 12/30/2020    I have reviewed relevant outside healthcare records

## 2021-02-04 NOTE — Assessment & Plan Note (Addendum)
31 year old female with known history of diabetic gastroparesis and nausea and vomiting presenting with 5-6 day history of intractable nausea and vomiting and poor PO intake  -clinically appears dehydrated. Given 500cc bolus in ED, discussed with nephrology, Dr. Joylene Grapes, and will do intermittent boluses over maintenance IVF with her PD. Will give her another 500cc bolus. Continue to monitor volume status -strict I/O -full liquid diet, advance as tolerated -will start treating anxiety as she thinks this is the culprit for her symptoms. See below.  -reglan 5mg  IV prn   -has borderline qt prolongation, will hold off on any other anti-emetics at this time. Has not had any episodes of emesis in ED -check UDS

## 2021-02-04 NOTE — H&P (Addendum)
History and Physical    JACQUELENE KOPECKY TGG:[id number] DOB: Jun 10, 1990 DOA: 02/04/2021  PCP: Fanny Bien, MD Consultants:  cardiology: Dr. Radford Pax, endocrinology: Dr. Loanne Drilling, GI: Neill Loft, nephrology: Dr. Zenda Alpers,  Patient coming from:  Home - lives with boyfriend and 31 year old son   Chief Complaint: intractable N/V and poor PO intake   HPI: JESELLE Donovan is a 31 y.o. female with medical history significant of  ESRD on PD, diabetes mellitus type I with gastroparesis, depression and diabetic retinopathy who is presenting with nausea and vomiting x 5-6 days with poor PO Intake. She denies any recent illness or exposure. She denies any stomach pain. She still makes urine (small amount), but has had no voids today. She states she has thrown up mulitple times a day. Food or liquid doesn't make a difference. She believes it's coming from her anxiety as her son has an upcoming surgery next Tuesday that she is really worried about.   She denies any fever/chills, headaches or vision changes, chest pain or palpitations, stomach pain, cough/shortness of breath , leg swelling, dysuria or other urinary symptoms.  Had her normal session of PD last night.     ED Course: vitals: afebrile, bp: 150/88, HR: 108, RR: 23, oxygen: 100%RA Pertinent labs: lipase: 60, potassium: 3.3, chloride: 84, co2: 18, glucose: 255, BUN: 62, creatinine: 15.12, AG: 29, covid/flu negative.  CXR: clear, UA pending  In ED: given 500cc bolus and [facility] was asked to admit.    Review of Systems: As per HPI; otherwise review of systems reviewed and negative.   Ambulatory Status:  Ambulates without assistance   Past Medical History:  Diagnosis Date   Asthma    as a child, no problems as an adult, no inhaler   Cataract    NS OU   Chronic hypertension during pregnancy, antepartum 08/19/2017   Dehydration 01/28/2018   Depression during pregnancy, antepartum 07/07/2017   6/20: Short trial of zoloft previously,  reports didn't help much but also didn't give it a chance Discussed r/b/a SSRIs in pregnancy, agrees to try Zoloft again, rx sent No SI/HI/red flags   Diabetes ([place])    TYPE I. A1C 7.5% 05/31/20   Diabetic retinopathy ([place]) 06/09/2017   07/2017 with bilateral severe diabetic non-proliferative retinopathy with macular edema.   ESRD (end stage renal disease) on dialysis ([place])    4 x week  M T TH F   HTN (hypertension)    Hypertensive retinopathy    OU   Hypokalemia 01/22/2018   Hypomagnesemia 01/28/2018   Intractable nausea and vomiting 01/22/2018   Intrauterine growth restriction (IUGR) affecting care of mother 12/22/2017   Morbid obesity ([place])    Nephropathy, diabetic ([place]) 12/29/2017   Peritoneal dialysis status ([place])    Proteinuria due to type 1 diabetes mellitus ([place]) 11/02/2017   Baseline Pr: Cr 10.23   Severe hyperemesis gravidarum 10/30/2017   Type I diabetes mellitus ([place]) 07/07/2017   Current Diabetic Medications:  Insulin  [x]  Aspirin 81 mg daily after 12 weeks (? A2/B GDM)  Required Referrals for A1GDM or A2GDM: [x]  Diabetes Education and Testing Supplies [x]  Nutrition Cousult  For A2/B GDM or higher classes of DM [x]  Diabetes Education and Testing Supplies [x]  Nutrition Counsult [x]  Fetal ECHO after 20 weeks  [x]  Eye exam for retina evaluation - severe retinopathy 7/19  Base   Ventricular septal defect (VSD) of fetus in singleton pregnancy, antepartum 09/30/2017   May go to newborn nursery per Dr. Lenard Simmer  Echo prior to discharge    Past Surgical History:  Procedure Laterality Date   25 GAUGE PARS PLANA VITRECTOMY WITH 20 GAUGE MVR PORT FOR MACULAR HOLE Left 07/20/2018   Procedure: 25 GAUGE PARS PLANA VITRECTOMY LEFT EYE;  Surgeon: Bernarda Caffey, MD;  Location: [place];  Service: Ophthalmology;  Laterality: Left;   CAPD INSERTION N/A 09/10/2020   Procedure: LAPAROSCOPIC INSERTION CONTINUOUS AMBULATORY PERITONEAL DIALYSIS  (CAPD) CATHETER;  Surgeon: Serafina Mitchell, MD;  Location: [place];  Service: Vascular;  Laterality: N/A;   CESAREAN SECTION N/A 12/26/2017   Procedure: CESAREAN SECTION;  Surgeon: Osborne Oman, MD;  Location: [place];  Service: Obstetrics;  Laterality: N/A;   EYE EXAMINATION UNDER ANESTHESIA Right 07/20/2018   Procedure: Eye Exam Under Anesthesia RIGHT EYE;  Surgeon: Bernarda Caffey, MD;  Location: [place];  Service: Ophthalmology;  Laterality: Right;   EYE SURGERY Left 07/2018   GAS INSERTION Left 07/19/2019   Procedure: INSERTION OF GAS;  Surgeon: Bernarda Caffey, MD;  Location: [place];  Service: Ophthalmology;  Laterality: Left;  SF6   INJECTION OF SILICONE OIL Left 07/19/945   Procedure: Injection Of Silicone Oil LEFT EYE;  Surgeon: Bernarda Caffey, MD;  Location: [facility];  Service: Ophthalmology;  Laterality: Left;   IR FLUORO GUIDE CV LINE RIGHT  06/02/2020   IR US GUIDE VASC ACCESS RIGHT  06/02/2020   LASER PHOTO ABLATION Right 07/20/2018   Procedure: Laser Photo Ablation RIGHT EYE;  Surgeon: Bernarda Caffey, MD;  Location: [facility];  Service: Ophthalmology;  Laterality: Right;   MEMBRANE PEEL Left 07/20/2018   Procedure: Membrane Peel LEFT EYE;  Surgeon: Bernarda Caffey, MD;  Location: [place] [facility];  Service: Ophthalmology;  Laterality: Left;   MEMBRANE PEEL Left 07/19/2019   Procedure: MEMBRANE PEEL;  Surgeon: Bernarda Caffey, MD;  Location: [place];  Service: Ophthalmology;  Laterality: Left;   MITOMYCIN C APPLICATION Bilateral 0/09/6281   Procedure: Avastin Application;  Surgeon: Bernarda Caffey, MD;  Location: [facility];  Service: Ophthalmology;  Laterality: Bilateral;   PHOTOCOAGULATION WITH LASER Left 07/20/2018   Procedure: Photocoagulation With Laser LEFT EYE;  Surgeon: Bernarda Caffey, MD;  Location: [place];  Service: Ophthalmology;  Laterality: Left;   PHOTOCOAGULATION WITH LASER Left 07/19/2019   Procedure: PHOTOCOAGULATION WITH LASER;  Surgeon: Bernarda Caffey, MD;  Location: [facility];  Service: Ophthalmology;  Laterality: Left;   RETINAL DETACHMENT SURGERY Left 07/20/2018   Dr.  Bernarda Caffey   SILICON OIL REMOVAL Left 07/19/2019   Procedure: 25g PARS PLANA VITRECTOMY WITH SILICON OIL REMOVAL;  Surgeon: Bernarda Caffey, MD;  Location: [facility];  Service: Ophthalmology;  Laterality: Left;   WISDOM TOOTH EXTRACTION      Social History   Socioeconomic History   Marital status: Significant Other    Spouse name: Not on file   Number of children: Not on file   Years of education: Not on file   Highest education level: Not on file  Occupational History   Not on file  Tobacco Use   Smoking status: Never   Smokeless tobacco: Never  Vaping Use   Vaping Use: Never used  Substance and Sexual Activity   Alcohol use: Not Currently   Drug use: Never   Sexual activity: Not Currently    Birth control/protection: None  Other Topics Concern   Not on file  Social History Narrative   Not on file   Social Determinants of Health   Financial Resource Strain: Not on file  Food Insecurity:  Not on file  Transportation Needs: Not on file  Physical Activity: Not on file  Stress: Not on file  Social Connections: Not on file  Intimate Partner Violence: Not on file    No Known Allergies  Family History  Problem Relation Age of Onset   Diabetes Mother    Aneurysm Mother 28   Seizures Mother    Diabetes Father    Cataracts Father    COPD Father    Heart attack Father    Heart disease Father    Healthy Sister    Healthy Daughter    Stroke Maternal Grandfather    Amblyopia Neg Hx    Blindness Neg Hx    Glaucoma Neg Hx    Macular degeneration Neg Hx    Retinal detachment Neg Hx    Strabismus Neg Hx    Retinitis pigmentosa Neg Hx    Colon cancer Neg Hx    Stomach cancer Neg Hx    Esophageal cancer Neg Hx    Pancreatic cancer Neg Hx    Liver disease Neg Hx     Prior to Admission medications   Medication Sig Start Date End Date Taking? Authorizing Provider  acetaminophen (TYLENOL) 500 MG tablet Take 1,000 mg by mouth every 6 (six) hours as needed for mild pain.     [provider]  amLODipine (NORVASC) 10 MG tablet Take 1 tablet (10 mg total) by mouth daily. 03/09/20   Mercy Riding, MD  calcitRIOL (ROCALTROL) 0.25 MCG capsule Take 0.25 mcg by mouth daily. 12/02/20   [provider]  carvedilol (COREG) 25 MG tablet Take 1 tablet (25 mg total) by mouth 2 (two) times daily. 03/07/19   Imogene Burn, PA-C  Continuous Blood Gluc Sensor MISC 1 each by Does not apply route as directed. Use as directed every 14 days. May dispense FreeStyle [employer] or similar. 01/27/20   Antonieta Pert, MD  escitalopram (LEXAPRO) 5 MG tablet Take 5 mg by mouth daily. 12/22/20   [provider]  gentamicin cream (GARAMYCIN) 0.1 % Apply 1 application topically daily. 09/10/20   [provider]  glucose blood (ONETOUCH VERIO) test strip 1 each by Other route 3 (three) times daily. And lancets 3/day 11/28/20   Renato Shin, MD  hydrALAZINE (APRESOLINE) 50 MG tablet Take 1 tablet (50 mg total) by mouth 3 (three) times daily. 12/31/20   Domenic Polite, MD  insulin glargine (LANTUS SOLOSTAR) 100 UNIT/ML Solostar Pen Inject 20 Units into the skin every morning. 11/13/20   Thurnell Lose, MD  insulin lispro (HUMALOG KWIKPEN) 100 UNIT/ML KwikPen Inject 3 Units into the skin 3 (three) times daily with meals. 11/28/20   Renato Shin, MD  losartan (COZAAR) 50 MG tablet Take 2 tablets (100 mg total) by mouth daily. 06/09/20   Geradine Girt, DO  metoCLOPramide (REGLAN) 5 MG tablet Take 1 tablet (5 mg total) by mouth 3 (three) times daily before meals for 3 days. 12/31/20 01/03/21  Domenic Polite, MD  multivitamin (RENA-VIT) TABS tablet Take 1 tablet by mouth at bedtime. 03/08/20   Mercy Riding, MD  ondansetron (ZOFRAN ODT) 4 MG disintegrating tablet Take 1 tablet (4 mg total) by mouth every 8 (eight) hours as needed for nausea or vomiting. 11/13/20   Thurnell Lose, MD  promethazine (PHENERGAN) 25 MG suppository Place 1 suppository (25 mg total)  rectally every 6 (six) hours as needed for nausea or vomiting. 11/25/20   Charlann Lange, PA-C  Physical Exam: Vitals:   02/04/21 1545 02/04/21 1547 02/04/21 1640 02/04/21 1738  BP:  (!) 129/97 (!) 176/118 121/80  Pulse: (!) 113  (!) 114   Resp: 18  16 (!) 21  Temp:  98.5 F (36.9 C) 99.6 F (37.6 C)   TempSrc:  Oral Oral   SpO2: 98%  100%      General:  Appears calm and comfortable and is in NAD Eyes:  PERRL, EOMI, normal lids, iris ENT:  grossly normal hearing, lips & tongue, mmm; appropriate dentition Neck:  no LAD, masses or thyromegaly; no carotid bruits Cardiovascular:  RRR, no m/r/g. No LE edema.  Respiratory:   CTA bilaterally with no wheezes/rales/rhonchi.  Normal respiratory effort. Abdomen:  soft, NT, ND, NABS. Peritoneal catheter in place  Back:   normal alignment, no CVAT Skin:  no rash or induration seen on limited exam. No skin tenting or delayed capillary refill  Musculoskeletal:  grossly normal tone BUE/BLE, good ROM, no bony abnormality Lower extremity:  No LE edema.  Limited foot exam with no ulcerations.  2+ distal pulses. Psychiatric:  grossly normal mood and affect, speech fluent and appropriate, AOx3 Neurologic:  CN 2-12 grossly intact, moves all extremities in coordinated fashion, sensation intact   Radiological Exams on Admission: Independently reviewed - see discussion in A/P where applicable  DG Chest Portable 1 View  Result Date: 02/04/2021 CLINICAL DATA:  Nausea and vomiting.  Hypoxia. EXAM: PORTABLE CHEST 1 VIEW COMPARISON:  12/28/2020. FINDINGS: Trachea is midline. Heart size normal. Lungs are clear. No pleural fluid. IMPRESSION: No acute findings. Electronically Signed   By: Lorin Picket M.D.   On: 02/04/2021 11:17    EKG: Independently reviewed.  Sinus tachycardia with rate 106; nonspecific ST changes with no evidence of acute ischemia. Borderline prolonged qt.    Labs on Admission: I have personally reviewed the available labs and  imaging studies at the time of the admission.  Pertinent labs:  lipase: 60,  potassium: 3.3,  chloride: 84,  co2: 18,  glucose: 255,  BUN: 62,  creatinine: 15.12,  AG: 29,  covid/flu negative.    Assessment/Plan .* Intractable nausea and vomiting- (present on admission) 31 year old female with known history of diabetic gastroparesis and nausea and vomiting presenting with 5-6 day history of intractable nausea and vomiting and poor PO intake  -clinically appears dehydrated. Given 500cc bolus in ED, discussed with nephrology, Dr. Joylene Grapes, and will do intermittent boluses over maintenance IVF with her PD. Will give her another 500cc bolus. Continue to monitor volume status -strict I/O -full liquid diet, advance as tolerated -will start treating anxiety as she thinks this is the culprit for her symptoms. See below.  -reglan 5mg  IV prn   -has borderline qt prolongation, will hold off on any other anti-emetics at this time. Has not had any episodes of emesis in ED -check UDS   Increased anion gap metabolic acidosis- (present on admission) Do not think she is in DKA, pH 7.5, urine pending for ketones. Think her ESRD is driving her AG. Will check beta-hydroxybutyric acid  No signs of infection/fever, have not gotten urine, CXR clear. She had an episode per nursing where oxygen dropped; however, has been satting fine.  Will continue home insulin and monitor with dialysis   Hypokalemia- (present on admission) Potassium: 3.3>3.4 Magnesium wnl PD, continue to monitor potassium   Prolonged QT interval- (present on admission) Repeat ekg Continue telemetry monitoring Magnesium wnl, potassium at 3.4  Avoid qt prolonging drugs.  Holding SSRI   ESRD (end stage renal disease) on PD- (present on admission) Appears dehydrated. Fluid boluses per above Nephrology consulted, Dr. Joylene Grapes, will see today, dialysis daily  Strict I/O  F/u on other recommendations per nephrology   Type 1 diabetes  mellitus with hyperglycemia ([place])- (present on admission) Continue lantus 20 units daily Very sensitive SSI TID with meals accuchecks per protocol   Chronic hypertension- (present on admission) Blood pressures decently-normal controlled. Has not been keeping everything down.   Continue home medication of coreg and norvasc, hold hydralazine and losartan at this time.  Will do PRN IV hydralazine if needed Dialysis as scheduled   Diabetic gastroparesis ([place])- (present on admission) Followed outpatient by GI Continue plan for n/v as above  Anxiety and depression- (present on admission) She thinks her anxiety may be driving force of her nausea and vomiting -start lorazepam bid prn, will hopefully help nausea as well -increase lexapro to 10mg  daily if able (holding now due to prolonged QT)  -consult psychiatry for such debilitating anxiety. Hopefully we can get her into counseling and get input on medication      There is no height or weight on file to calculate BMI.    Level of care: Telemetry Medical DVT prophylaxis: heparin   Code Status:  Full - confirmed with patient Family Communication: None present Disposition Plan:  The patient is from: home  Anticipated d/c is to: home   Patient placed in observation as anticipate less than 2 midnight stay. Requires hospitalization due to intractable N/V, dehydration and need for IV fluids and medication, dialysis and MDM with other specialists. Not safe for her to return home at this time.    Patient is currently: ill, but stable.  Consults called: nephrology   Admission status:  observation    Orma Flaming MD [facility]   How to contact the [facility] Attending or Consulting provider [place] or covering provider during after hours [place], for this patient?  Check the care team in [facility] and look for a) attending/consulting [facility] provider listed and b) the [facility] team listed Log into [email or web address] and use [place] universal password  to access. If you do not have the password, please contact the hospital operator. Locate the [facility] provider you are looking for under [facility] and page to a number that you can be directly reached. If you still have difficulty reaching the provider, please page the [facility] (Director on Call) for the Hospitalists listed on amion for assistance.   02/04/2021, 7:26 PM

## 2021-02-04 NOTE — Assessment & Plan Note (Signed)
Continue lantus 20 units daily Very sensitive SSI TID with meals accuchecks per protocol

## 2021-02-04 NOTE — Assessment & Plan Note (Addendum)
She thinks her anxiety may be driving force of her nausea and vomiting -start lorazepam bid prn, will hopefully help nausea as well -increase lexapro to 10mg  daily if able (holding now due to prolonged QT)  -consult psychiatry for such debilitating anxiety. Hopefully we can get her into counseling and get input on medication

## 2021-02-04 NOTE — Assessment & Plan Note (Addendum)
Do not think she is in DKA, pH 7.5, urine pending for ketones. Think her ESRD is driving her AG. Will check beta-hydroxybutyric acid  No signs of infection/fever, have not gotten urine, CXR clear. She had an episode per nursing where oxygen dropped; however, has been satting fine.  Will continue home insulin and monitor with dialysis

## 2021-02-04 NOTE — ED Triage Notes (Signed)
BIB [facility] after pt called to report N/V x 8 days. Per EMS pt unable to hold anything down. Pt systolic b/p 88'T, resolved with 200 cc fluid. Pt given 4 mg Zofran en route. Pt compliant with Peritoneal dialysis at home, did tx last night.   HX: ESRD, DM1

## 2021-02-04 NOTE — ED Notes (Signed)
PT desat to mid 29's. 2 L [place] applied. MD Zavitz made aware.

## 2021-02-04 NOTE — Assessment & Plan Note (Addendum)
Appears dehydrated. Fluid boluses per above Nephrology consulted, Dr. Joylene Grapes, will see today, dialysis daily  Strict I/O  F/u on other recommendations per nephrology

## 2021-02-04 NOTE — Assessment & Plan Note (Addendum)
Repeat ekg Continue telemetry monitoring Magnesium wnl, potassium at 3.4  Avoid qt prolonging drugs.  Holding SSRI

## 2021-02-04 NOTE — Assessment & Plan Note (Addendum)
Blood pressures decently-normal controlled. Has not been keeping everything down.   Continue home medication of coreg and norvasc, hold hydralazine and losartan at this time.  Will do PRN IV hydralazine if needed Dialysis as scheduled

## 2021-02-04 NOTE — ED Provider Notes (Signed)
[facility] EMERGENCY DEPARTMENT Provider Note   CSN: [id number] Arrival date & time: 02/04/21  0859     History  Chief Complaint  Patient presents with   Nausea   Emesis    Barbara Donovan is a 31 y.o. female.  Patient with history of type 1 diabetes, end-stage renal disease on peritoneal dialysis last dialyzed yesterday evening presents with recurrent nausea and vomiting and abdominal cramping for 1 week.  Patient was told by one physician likely gastroparesis however gastroenterologist felt it was another diagnosis per her report.  Patient's been admitted multiple times for diabetes related concerns.  Patient has history of high blood pressure.  Performs peritoneal dialysis Monday Tuesday Thursday Friday.  Patient denies fevers or respiratory symptoms.  Blood sugars have been 200s and 300s recently.  Patient is on insulin.      Home Medications Prior to Admission medications   Medication Sig Start Date End Date Taking? Authorizing Provider  acetaminophen (TYLENOL) 500 MG tablet Take 1,000 mg by mouth every 6 (six) hours as needed for mild pain.    [provider]  amLODipine (NORVASC) 10 MG tablet Take 1 tablet (10 mg total) by mouth daily. 03/09/20   Mercy Riding, MD  calcitRIOL (ROCALTROL) 0.25 MCG capsule Take 0.25 mcg by mouth daily. 12/02/20   [provider]  carvedilol (COREG) 25 MG tablet Take 1 tablet (25 mg total) by mouth 2 (two) times daily. 03/07/19   Imogene Burn, PA-C  Continuous Blood Gluc Sensor MISC 1 each by Does not apply route as directed. Use as directed every 14 days. May dispense FreeStyle [employer] or similar. 01/27/20   Antonieta Pert, MD  escitalopram (LEXAPRO) 5 MG tablet Take 5 mg by mouth daily. 12/22/20   [provider]  gentamicin cream (GARAMYCIN) 0.1 % Apply 1 application topically daily. 09/10/20   [provider]  glucose blood (ONETOUCH VERIO) test strip 1 each by Other route 3  (three) times daily. And lancets 3/day 11/28/20   Renato Shin, MD  hydrALAZINE (APRESOLINE) 50 MG tablet Take 1 tablet (50 mg total) by mouth 3 (three) times daily. 12/31/20   Domenic Polite, MD  insulin glargine (LANTUS SOLOSTAR) 100 UNIT/ML Solostar Pen Inject 20 Units into the skin every morning. 11/13/20   Thurnell Lose, MD  insulin lispro (HUMALOG KWIKPEN) 100 UNIT/ML KwikPen Inject 3 Units into the skin 3 (three) times daily with meals. 11/28/20   Renato Shin, MD  losartan (COZAAR) 50 MG tablet Take 2 tablets (100 mg total) by mouth daily. 06/09/20   Geradine Girt, DO  metoCLOPramide (REGLAN) 5 MG tablet Take 1 tablet (5 mg total) by mouth 3 (three) times daily before meals for 3 days. 12/31/20 01/03/21  Domenic Polite, MD  multivitamin (RENA-VIT) TABS tablet Take 1 tablet by mouth at bedtime. 03/08/20   Mercy Riding, MD  ondansetron (ZOFRAN ODT) 4 MG disintegrating tablet Take 1 tablet (4 mg total) by mouth every 8 (eight) hours as needed for nausea or vomiting. 11/13/20   Thurnell Lose, MD  promethazine (PHENERGAN) 25 MG suppository Place 1 suppository (25 mg total) rectally every 6 (six) hours as needed for nausea or vomiting. 11/25/20   Charlann Lange, PA-C      Allergies    Patient has no known allergies.    Review of Systems   Review of Systems  Constitutional:  Positive for fatigue. Negative for chills and fever.  HENT:  Negative for  congestion.   Eyes:  Negative for visual disturbance.  Respiratory:  Negative for shortness of breath.   Cardiovascular:  Negative for chest pain.  Gastrointestinal:  Positive for nausea and vomiting. Negative for abdominal pain.  Genitourinary:  Negative for dysuria and flank pain.  Musculoskeletal:  Negative for back pain, neck pain and neck stiffness.  Skin:  Negative for rash.  Neurological:  Positive for light-headedness. Negative for headaches.   Physical Exam Updated Vital Signs BP (!) 149/97    Pulse (!) 103    Temp 98.7 F  (37.1 C)    Resp 13    SpO2 100%  Physical Exam Vitals and nursing note reviewed.  Constitutional:      General: She is not in acute distress.    Appearance: She is well-developed.  HENT:     Head: Normocephalic and atraumatic.     Mouth/Throat:     Mouth: Mucous membranes are dry.  Eyes:     General:        Right eye: No discharge.        Left eye: No discharge.     Conjunctiva/sclera: Conjunctivae normal.  Neck:     Trachea: No tracheal deviation.  Cardiovascular:     Rate and Rhythm: Regular rhythm. Tachycardia present.  Pulmonary:     Effort: Pulmonary effort is normal.  Abdominal:     General: There is no distension.     Palpations: Abdomen is soft.     Tenderness: There is abdominal tenderness. There is no guarding.     Comments: Minimal epigastric  Musculoskeletal:     Cervical back: Normal range of motion and neck supple. No rigidity.  Skin:    General: Skin is warm.     Capillary Refill: Capillary refill takes less than 2 seconds.     Findings: No rash.  Neurological:     General: No focal deficit present.     Mental Status: She is alert.     Cranial Nerves: No cranial nerve deficit.  Psychiatric:        Mood and Affect: Mood is not anxious.     Comments: Fatigued appearing    ED Results / Procedures / Treatments   Labs (all labs ordered are listed, but only abnormal results are displayed) Labs Reviewed  LIPASE, BLOOD - Abnormal; Notable for the following components:      Result Value   Lipase 60 (*)    All other components within normal limits  COMPREHENSIVE METABOLIC PANEL - Abnormal; Notable for the following components:   Sodium 131 (*)    Potassium 3.3 (*)    Chloride 84 (*)    CO2 18 (*)    Glucose, Bld 255 (*)    BUN 62 (*)    Creatinine, Ser 15.12 (*)    Total Protein 8.8 (*)    AST 10 (*)    GFR, Estimated 3 (*)    Anion gap 29 (*)    All other components within normal limits  CBC - Abnormal; Notable for the following components:   RBC  5.69 (*)    Hemoglobin 15.8 (*)    HCT 48.4 (*)    Platelets 498 (*)    All other components within normal limits  I-STAT VENOUS BLOOD GAS, ED - Abnormal; Notable for the following components:   pH, Ven 7.536 (*)    pCO2, Ven 28.6 (*)    Acid-Base Excess 3.0 (*)    Sodium 130 (*)    Potassium  3.4 (*)    Calcium, Ion 0.95 (*)    HCT 50.0 (*)    Hemoglobin 17.0 (*)    All other components within normal limits  CBG MONITORING, ED - Abnormal; Notable for the following components:   Glucose-Capillary 281 (*)    All other components within normal limits  RESP PANEL BY RT-PCR (FLU A&B, COVID) ARPGX2  MAGNESIUM  URINALYSIS, ROUTINE W REFLEX MICROSCOPIC  RAPID URINE DRUG SCREEN, HOSP PERFORMED  BETA-HYDROXYBUTYRIC ACID  HIV ANTIBODY (ROUTINE TESTING W REFLEX)  TSH  I-STAT BETA HCG BLOOD, ED ([facility], [facility], [facility] ONLY)    EKG EKG Interpretation  Date/Time:  Wednesday February 04 2021 10:00:40 EST Ventricular Rate:  106 PR Interval:  179 QRS Duration: 110 QT Interval:  362 QTC Calculation: 481 R Axis:   65 Text Interpretation: Sinus tachycardia Borderline prolonged QT interval Confirmed by Elnora Morrison [id number]) on 02/04/2021 10:50:09 AM  Radiology DG Chest Portable 1 View  Result Date: 02/04/2021 CLINICAL DATA:  Nausea and vomiting.  Hypoxia. EXAM: PORTABLE CHEST 1 VIEW COMPARISON:  12/28/2020. FINDINGS: Trachea is midline. Heart size normal. Lungs are clear. No pleural fluid. IMPRESSION: No acute findings. Electronically Signed   By: Lorin Picket M.D.   On: 02/04/2021 11:17    Procedures .Critical Care Performed by: Elnora Morrison, MD Authorized by: Elnora Morrison, MD   Critical care provider statement:    Critical care time (minutes):  30   Critical care start time:  02/04/2021 11:00 PM   Critical care end time:  02/04/2021 11:30 PM   Critical care time was exclusive of:  Separately billable procedures and treating other patients and teaching time   Critical care was necessary to  treat or prevent imminent or life-threatening deterioration of the following conditions:  Metabolic crisis   Critical care was time spent personally by me on the following activities:  Evaluation of patient's response to treatment, examination of patient, ordering and review of radiographic studies, ordering and review of laboratory studies, re-evaluation of patient's condition, pulse oximetry and review of old charts    Medications Ordered in ED Medications  metoCLOPramide (REGLAN) injection 5 mg (has no administration in time range)  heparin injection 5,000 Units (has no administration in time range)  acetaminophen (TYLENOL) tablet 650 mg (has no administration in time range)    Or  acetaminophen (TYLENOL) suppository 650 mg (has no administration in time range)  hydrALAZINE (APRESOLINE) injection 5 mg (has no administration in time range)  sodium chloride flush (NS) 0.9 % injection 3 mL (has no administration in time range)  sodium chloride flush (NS) 0.9 % injection 3 mL (has no administration in time range)  0.9 %  sodium chloride infusion (has no administration in time range)  sodium chloride 0.9 % bolus 500 mL (has no administration in time range)  clonazePAM (KLONOPIN) tablet 0.5 mg (has no administration in time range)  sodium chloride 0.9 % bolus 500 mL (500 mLs Intravenous New Bag/Given 02/04/21 1017)  LORazepam (ATIVAN) injection 0.5 mg (0.5 mg Intravenous Given 02/04/21 1018)    ED Course/ Medical Decision Making/ A&P                           Medical Decision Making Amount and/or Complexity of Data Reviewed Labs: ordered.    Details: Labs reviewed lipase 60 minimally elevated, unrelated to presentation unless response to vomiting.  Sodium reviewed 131 mild hyponatremia secondary to decreased intake and vomiting, potassium 3.3.  Chloride 84.  Glucose 255.  BUN 62 creatinine 15 secondary to dehydration and renal failure.  Anion gap of 29 likely combination of dehydration and mild  DKA.  Hemoglobin 15.8, normal white blood cell count.  pH 7.5 reviewed VBG. Radiology: ordered. ECG/medicine tests: ordered.    Details: Chest x-ray reviewed no acute infiltrate. Discussion of management or test interpretation with external provider(s): Discussed admission with hospitalist for further fluids, management, peritoneal dialysis, insulin.  Risk Prescription drug management. Decision regarding hospitalization.   Patient presents with recurrent nausea vomiting and epigastric discomfort for 1 week.  Patient has high risk history given end-stage renal disease, uncontrolled type 1 diabetes and multiple admissions.  Medical records reviewed and discharge summary 12/14 discussing admission for vomiting, gastroparesis, end-stage renal disease, mild DKA and high blood pressure urgency.  Plan for IV fluids, antiemetics, blood work, set of vitals and reassessment.  Patient admitted to the hospitalist as needs fluids, insulin and diabetic management.  Ativan given for mild QT prolongation and need of antiemetics.         Final Clinical Impression(s) / ED Diagnoses Final diagnoses:  Diabetes mellitus due to underlying condition with hyperglycemia, with long-term current use of insulin (HCC)  Vomiting in adult  Hypokalemia  Hyponatremia  ESRD on peritoneal dialysis ([place])  High anion gap metabolic acidosis    Rx / DC Orders ED Discharge Orders     None         Elnora Morrison, MD 02/04/21 1350

## 2021-02-04 NOTE — Assessment & Plan Note (Signed)
Potassium: 3.3>3.4 Magnesium wnl PD, continue to monitor potassium

## 2021-02-04 NOTE — Assessment & Plan Note (Signed)
Followed outpatient by GI Continue plan for n/v as above

## 2021-02-05 DIAGNOSIS — R112 Nausea with vomiting, unspecified: Secondary | ICD-10-CM | POA: Diagnosis not present

## 2021-02-05 DIAGNOSIS — F32A Depression, unspecified: Secondary | ICD-10-CM | POA: Diagnosis not present

## 2021-02-05 DIAGNOSIS — F419 Anxiety disorder, unspecified: Secondary | ICD-10-CM

## 2021-02-05 LAB — BASIC METABOLIC PANEL
Anion gap: 23 — ABNORMAL HIGH (ref 5–15)
BUN: 61 mg/dL — ABNORMAL HIGH (ref 6–20)
CO2: 22 mmol/L (ref 22–32)
Calcium: 8.9 mg/dL (ref 8.9–10.3)
Chloride: 90 mmol/L — ABNORMAL LOW (ref 98–111)
Creatinine, Ser: 15.22 mg/dL — ABNORMAL HIGH (ref 0.44–1.00)
GFR, Estimated: 3 mL/min — ABNORMAL LOW (ref >60)
Glucose, Bld: 148 mg/dL — ABNORMAL HIGH (ref 70–99)
Potassium: 3.1 mmol/L — ABNORMAL LOW (ref 3.5–5.1)
Sodium: 135 mmol/L (ref 135–145)

## 2021-02-05 LAB — CBC
HCT: 42.4 % (ref 36.0–46.0)
Hemoglobin: 14.1 g/dL (ref 12.0–15.0)
MCH: 28.3 pg (ref 26.0–34.0)
MCHC: 33.3 g/dL (ref 30.0–36.0)
MCV: 85.1 fL (ref 80.0–100.0)
Platelets: 430 10*3/uL — ABNORMAL HIGH (ref 150–400)
RBC: 4.98 MIL/uL (ref 3.87–5.11)
RDW: 12.9 % (ref 11.5–15.5)
WBC: 7.8 10*3/uL (ref 4.0–10.5)
nRBC: 0 % (ref 0.0–0.2)

## 2021-02-05 LAB — PATHOLOGIST SMEAR REVIEW

## 2021-02-05 LAB — GLUCOSE, CAPILLARY
Glucose-Capillary: 152 mg/dL — ABNORMAL HIGH (ref 70–99)
Glucose-Capillary: 156 mg/dL — ABNORMAL HIGH (ref 70–99)
Glucose-Capillary: 171 mg/dL — ABNORMAL HIGH (ref 70–99)
Glucose-Capillary: 190 mg/dL — ABNORMAL HIGH (ref 70–99)
Glucose-Capillary: 236 mg/dL — ABNORMAL HIGH (ref 70–99)

## 2021-02-05 MED ORDER — CLONAZEPAM 0.5 MG PO TABS: 0.5000 mg | ORAL_TABLET | Freq: Two times a day (BID) | ORAL | 0 refills | Status: DC | PRN

## 2021-02-05 MED ORDER — POTASSIUM CHLORIDE CRYS ER 20 MEQ PO TBCR
40.0000 meq | EXTENDED_RELEASE_TABLET | Freq: Once | ORAL | Status: AC
  Administered 2021-02-05: 40 meq via ORAL
  Filled 2021-02-05: qty 2

## 2021-02-05 MED ORDER — LANTUS SOLOSTAR 100 UNIT/ML ~~LOC~~ SOPN: 20.0000 [IU] | PEN_INJECTOR | SUBCUTANEOUS | 2 refills | Status: DC

## 2021-02-05 MED ORDER — MIRTAZAPINE 15 MG PO TABS: 7.5000 mg | ORAL_TABLET | Freq: Every day | ORAL | Status: DC

## 2021-02-05 MED ORDER — SEVELAMER CARBONATE 800 MG PO TABS
800.0000 mg | ORAL_TABLET | Freq: Three times a day (TID) | ORAL | Status: DC
  Administered 2021-02-05 (×2): 800 mg via ORAL
  Filled 2021-02-05 (×2): qty 1

## 2021-02-05 MED ORDER — SEVELAMER CARBONATE 800 MG PO TABS: 800.0000 mg | ORAL_TABLET | Freq: Three times a day (TID) | ORAL | 1 refills | Status: DC

## 2021-02-05 MED ORDER — MIRTAZAPINE 7.5 MG PO TABS: 7.5000 mg | ORAL_TABLET | Freq: Every day | ORAL | 1 refills | Status: DC

## 2021-02-05 MED ORDER — METOCLOPRAMIDE HCL 5 MG PO TABS: 5.0000 mg | ORAL_TABLET | Freq: Three times a day (TID) | ORAL | 1 refills | Status: DC

## 2021-02-05 NOTE — Progress Notes (Signed)
°  Transition of Care [facility]) Screening Note   Patient Details  Name: Barbara Donovan Date of Birth: 1990/03/07   [facility]) CM/SW Contact:    Geralynn Ochs, LCSW Phone Number: 02/05/2021, 8:58 AM    Transition of Care Department [facility]) has reviewed patient and no TOC needs have been identified at this time; medical workup ongoing. We will continue to monitor patient advancement through interdisciplinary progression rounds. If new patient transition needs arise, please place a TOC consult.

## 2021-02-05 NOTE — Discharge Summary (Addendum)
[facility]  Physician Discharge Summary   Patient ID: Barbara Donovan MRN: [id number] DOB/AGE: 18-Jun-1990 31 y.o.  Admit date: 02/04/2021 Discharge date: 02/05/2021    PCP: Fanny Bien, MD  DISCHARGE DIAGNOSES:  Principal Problem:   Intractable nausea and vomiting Active Problems:   Chronic hypertension   Hypokalemia   Diabetic gastroparesis (HCC)   Type 1 diabetes mellitus with hyperglycemia (HCC)   ESRD (end stage renal disease) on PD   Increased anion gap metabolic acidosis   Prolonged QT interval   Anxiety and depression   RECOMMENDATIONS FOR OUTPATIENT FOLLOW UP: Outpatient follow-up with nephrology and gastroenterology   Home Health: None Equipment/Devices: None  CODE STATUS: Full code  DISCHARGE CONDITION: fair  Diet recommendation: As before  INITIAL HISTORY: As per H&P: "Barbara Donovan is a 31 y.o. female with medical history significant of  ESRD on PD, diabetes mellitus type I with gastroparesis, depression and diabetic retinopathy who is presenting with nausea and vomiting x 5-6 days with poor PO Intake. She denies any recent illness or exposure. She denies any stomach pain. She still makes urine (small amount), but has had no voids today. She states she has thrown up mulitple times a day. Food or liquid doesn't make a difference. She believes it's coming from her anxiety as her son has an upcoming surgery next Tuesday that she is really worried about.    She denies any fever/chills, headaches or vision changes, chest pain or palpitations, stomach pain, cough/shortness of breath , leg swelling, dysuria or other urinary symptoms.   Had her normal session of PD last night.      ED Course: vitals: afebrile, bp: 150/88, HR: 108, RR: 23, oxygen: 100%RA Pertinent labs: lipase: 60, potassium: 3.3, chloride: 84, co2: 18, glucose: 255, BUN: 62, creatinine: 15.12, AG: 29, covid/flu negative.  CXR: clear, UA pending  In ED: given 500cc bolus and TRH  was asked to admit."  Consultations: Nephrology  Procedures: Peritoneal dialysis   HOSPITAL COURSE:    Intractable nausea and vomiting- (present on admission) 31 year old female with known history of diabetic gastroparesis and nausea and vomiting presenting with 5-6 day history of intractable nausea and vomiting and poor PO intake.  She was noted to be dehydrated.  She was given fluids.  Started on metoclopramide intravenously.  Symptoms started improving.  Abdomen was noted to be benign.  Tolerated her diet today.  Wishes to go home.  She will be discharged with a prescription for metoclopramide.  Peritoneal fluid analysis did not suggest any infection.  Anxiety and depression- (present on admission) She thinks her anxiety may be driving force of her nausea and vomiting.  She was seen by psychiatry.  She was changed over from Lexapro to mirtazapine. She is experiencing severe anxiety due to her son's surgery which is upcoming.  Due to acute anxiety she was discharged on a few tablets of Klonopin.    Prolonged QT interval- (present on admission) Very borderline QT prolongation with QTC of 481.  SSRI changed over to mirtazapine.  Increased anion gap metabolic acidosis- (present on admission) Most likely due to her renal failure rather than DKA.    ESRD (end stage renal disease) on PD- (present on admission) Seen by nephrology.  Received peritoneal dialysis in the hospital  Type 1 diabetes mellitus with hyperglycemia ([place])- (present on admission) Continue home medication  Diabetic gastroparesis ([place])- (present on admission) Followed outpatient by [facility] with IV Reglan.  Will be sent out with  a prescription for oral metoclopramide.  Chronic hypertension- (present on admission) Continue home medication      Patient is stable.  She is tolerated her diet.  Has not had any nausea vomiting and more than 12 hours.  Wants to go home.  Okay for discharge.    PERTINENT  LABS:  The results of significant diagnostics from this hospitalization (including imaging, microbiology, ancillary and laboratory) are listed below for reference.    Microbiology: Recent Results (from the past 240 hour(s))  Resp Panel by RT-PCR (Flu A&B, Covid) Nasopharyngeal Swab     Status: None   Collection Time: 02/04/21 10:29 AM   Specimen: Nasopharyngeal Swab; Nasopharyngeal(NP) swabs in vial transport medium  Result Value Ref Range Status   SARS Coronavirus 2 by RT PCR NEGATIVE NEGATIVE Final    Comment: (NOTE) SARS-CoV-2 target nucleic acids are NOT DETECTED.  The SARS-CoV-2 RNA is generally detectable in upper respiratory specimens during the acute phase of infection. The lowest concentration of SARS-CoV-2 viral copies this assay can detect is 138 copies/mL. A negative result does not preclude SARS-Cov-2 infection and should not be used as the sole basis for treatment or other patient management decisions. A negative result may occur with  improper specimen collection/handling, submission of specimen other than nasopharyngeal swab, presence of viral mutation(s) within the areas targeted by this assay, and inadequate number of viral copies(<138 copies/mL). A negative result must be combined with clinical observations, patient history, and epidemiological information. The expected result is Negative.  Fact Sheet for Patients:  EntrepreneurPulse.com.au  Fact Sheet for Healthcare Providers:  IncredibleEmployment.be  This test is no t yet approved or cleared by the [place] FDA and  has been authorized for detection and/or diagnosis of SARS-CoV-2 by FDA under an Emergency Use Authorization (EUA). This EUA will remain  in effect (meaning this test can be used) for the duration of the COVID-19 declaration under Section 564(b)(1) of the Act, 21 U.S.C.section 360bbb-3(b)(1), unless the authorization is terminated  or revoked sooner.        Influenza A by PCR NEGATIVE NEGATIVE Final   Influenza B by PCR NEGATIVE NEGATIVE Final    Comment: (NOTE) The Xpert Xpress SARS-CoV-2/FLU/RSV plus assay is intended as an aid in the diagnosis of influenza from Nasopharyngeal swab specimens and should not be used as a sole basis for treatment. Nasal washings and aspirates are unacceptable for Xpert Xpress SARS-CoV-2/FLU/RSV testing.  Fact Sheet for Patients: EntrepreneurPulse.com.au  Fact Sheet for Healthcare Providers: IncredibleEmployment.be  This test is not yet approved or cleared by the [place] FDA and has been authorized for detection and/or diagnosis of SARS-CoV-2 by FDA under an Emergency Use Authorization (EUA). This EUA will remain in effect (meaning this test can be used) for the duration of the COVID-19 declaration under Section 564(b)(1) of the Act, 21 U.S.C. section 360bbb-3(b)(1), unless the authorization is terminated or revoked.  Performed at [facility], [place] [address] [place], [place] [address]   Body fluid culture w Gram Stain     Status: None (Preliminary result)   Collection Time: 02/04/21  2:04 PM   Specimen: Body Fluid  Result Value Ref Range Status   Specimen Description FLUID  Final   Special Requests PERITONEAL DIALYSIS  Final   Gram Stain   Final    FEW WBC PRESENT, PREDOMINANTLY MONONUCLEAR RARE GRAM NEGATIVE RODS    Culture   Final    NO GROWTH 2 DAYS Performed at [facility], [address] [place], [place] [id number]    Report Status PENDING  Incomplete     Labs:  COVID-19 Labs   Lab Results  Component Value Date   [place] 02/04/2021   [place] NEGATIVE 12/27/2020   [place] NEGATIVE 11/10/2020   [place] NEGATIVE 07/28/2020      Basic Metabolic Panel: Recent Labs  Lab 02/04/21 0931 02/04/21 0940 02/04/21 1455 02/05/21 0345  NA 131* 130*  --  135  K 3.3* 3.4*  --  3.1*  CL 84*  --    --  90*  CO2 18*  --   --  22  GLUCOSE 255*  --   --  148*  BUN 62*  --   --  61*  CREATININE 15.12*  --   --  15.22*  CALCIUM 9.9  --   --  8.9  MG 2.4  --   --   --   PHOS  --   --  11.8*  --    Liver Function Tests: Recent Labs  Lab 02/04/21 0931  AST 10*  ALT 12  ALKPHOS 65  BILITOT 0.6  PROT 8.8*  ALBUMIN 4.3   Recent Labs  Lab 02/04/21 0931  LIPASE 60*    CBC: Recent Labs  Lab 02/04/21 0931 02/04/21 0940 02/05/21 0345  WBC 6.1  --  7.8  HGB 15.8* 17.0* 14.1  HCT 48.4* 50.0* 42.4  MCV 85.1  --  85.1  PLT 498*  --  430*    CBG: Recent Labs  Lab 02/05/21 0017 02/05/21 0326 02/05/21 0806 02/05/21 1130 02/05/21 1604  GLUCAP 152* 171* 156* 190* 236*     IMAGING STUDIES DG Chest Portable 1 View  Result Date: 02/04/2021 CLINICAL DATA:  Nausea and vomiting.  Hypoxia. EXAM: PORTABLE CHEST 1 VIEW COMPARISON:  12/28/2020. FINDINGS: Trachea is midline. Heart size normal. Lungs are clear. No pleural fluid. IMPRESSION: No acute findings. Electronically Signed   By: Lorin Picket M.D.   On: 02/04/2021 11:17    DISCHARGE EXAMINATION: Vitals:   02/05/21 0900 02/05/21 1125 02/05/21 1200 02/05/21 1600  BP: 108/66 115/88  (!) 130/95  Pulse: 74 91  91  Resp: 20 14  13   Temp: 97.6 F (36.4 C) 98.5 F (36.9 C)  99.1 F (37.3 C)  TempSrc: Temporal Oral  Oral  SpO2: 95% 99% 98% 99%  Weight: 61 kg      General appearance: Awake alert.  In no distress Resp: Clear to auscultation bilaterally.  Normal effort Cardio: S1-S2 is normal regular.  No S3-S4.  No rubs murmurs or bruit GI: Abdomen is soft.  Nontender nondistended.  Bowel sounds are present normal.  No masses organomegaly    DISPOSITION: Home  Discharge Instructions     Call MD for:  difficulty breathing, headache or visual disturbances   Complete by: As directed    Call MD for:  extreme fatigue   Complete by: As directed    Call MD for:  persistant dizziness or light-headedness   Complete by: As  directed    Call MD for:  persistant nausea and vomiting   Complete by: As directed    Call MD for:  severe uncontrolled pain   Complete by: As directed    Call MD for:  temperature >100.4   Complete by: As directed    Diet Carb Modified   Complete by: As directed    Discharge instructions   Complete by: As directed    Please seek attention if  your symptoms recur.  Take your medications as prescribed.  You were cared for by a hospitalist during your hospital stay. If you have any questions about your discharge medications or the care you received while you were in the hospital after you are discharged, you can call the unit and asked to speak with the hospitalist on call if the hospitalist that took care of you is not available. Once you are discharged, your primary care physician will handle any further medical issues. Please note that NO REFILLS for any discharge medications will be authorized once you are discharged, as it is imperative that you return to your primary care physician (or establish a relationship with a primary care physician if you do not have one) for your aftercare needs so that they can reassess your need for medications and monitor your lab values. If you do not have a primary care physician, you can call [phone] for a physician referral.   Increase activity slowly   Complete by: As directed    No wound care   Complete by: As directed           Allergies as of 02/05/2021   No Known Allergies      Medication List     STOP taking these medications    escitalopram 5 MG tablet Commonly known as: LEXAPRO       TAKE these medications    acetaminophen 500 MG tablet Commonly known as: TYLENOL Take 1,000 mg by mouth every 6 (six) hours as needed for mild pain.   amLODipine 10 MG tablet Commonly known as: NORVASC Take 1 tablet (10 mg total) by mouth daily.   calcitRIOL 0.25 MCG capsule Commonly known as: ROCALTROL Take 0.25 mcg by mouth daily.    carvedilol 25 MG tablet Commonly known as: COREG Take 1 tablet (25 mg total) by mouth 2 (two) times daily.   clonazePAM 0.5 MG tablet Commonly known as: KLONOPIN Take 1 tablet (0.5 mg total) by mouth 2 (two) times daily as needed for anxiety.   Continuous Blood Gluc Sensor Misc 1 each by Does not apply route as directed. Use as directed every 14 days. May dispense FreeStyle [employer] or similar.   gentamicin cream 0.1 % Commonly known as: GARAMYCIN Apply 1 application topically daily.   heparin sodium (porcine) 1000 UNIT/ML injection Inject 3 mLs into the vein daily.   hydrALAZINE 50 MG tablet Commonly known as: APRESOLINE Take 1 tablet (50 mg total) by mouth 3 (three) times daily.   insulin lispro 100 UNIT/ML KwikPen Commonly known as: HumaLOG KwikPen Inject 3 Units into the skin 3 (three) times daily with meals.   Lantus SoloStar 100 UNIT/ML Solostar Pen Generic drug: insulin glargine Inject 20 Units into the skin every morning.   losartan 50 MG tablet Commonly known as: COZAAR Take 2 tablets (100 mg total) by mouth daily.   metoCLOPramide 5 MG tablet Commonly known as: REGLAN Take 1 tablet (5 mg total) by mouth 3 (three) times daily before meals.   mirtazapine 7.5 MG tablet Commonly known as: REMERON Take 1 tablet (7.5 mg total) by mouth at bedtime.   multivitamin Tabs tablet Take 1 tablet by mouth at bedtime.   ondansetron 4 MG disintegrating tablet Commonly known as: Zofran ODT Take 1 tablet (4 mg total) by mouth every 8 (eight) hours as needed for nausea or vomiting.   OneTouch Verio test strip Generic drug: glucose blood 1 each by Other route 3 (three) times daily. And lancets  3/day   promethazine 25 MG suppository Commonly known as: PHENERGAN Place 1 suppository (25 mg total) rectally every 6 (six) hours as needed for nausea or vomiting.   sevelamer carbonate 800 MG tablet Commonly known as: RENVELA Take 1 tablet (800 mg total) by mouth 3  (three) times daily with meals.           TOTAL DISCHARGE TIME: 35 minutes  Elye Harmsen [employer] on [email or web address]  02/06/2021, 12:19 PM

## 2021-02-05 NOTE — Progress Notes (Signed)
ESRD Consult Note  Requesting provider: Orma Flaming Service requesting consult: Hospitalist Reason for consult: ESRD, provision of dialysis Indication for acute dialysis?: End Stage Renal Disease  Outpatient dialysis unit: [facility] Outpatient dialysis schedule: Daily PD Script: CCPD 7x week, 5 cycles, 2000 fill, dwell 1.5hrs, EDW 63, no day dwell Receiving iron, no mircera PTH 909 and phos 9 on 1/3  Assessment/Recommendations: Barbara Donovan is a/an 31 y.o. female with a past medical history notable for ESRD on HD admitted with nausea/vomiting.   # ESRD: PD per script as above.  Continue with 1.5% bags tonight if she stays.  Otherwise she is stable to discharge if everything is able to get set up.  # Volume/ hypertension: Hydration improved. EDW 63 kg.  Blood pressures slightly elevated at this time.  Home medications include coreg 25mg  bid, losartan 50mg  daily, hydral 50mg  bid, norvasc 10mg  daily  # Anemia of Chronic Kidney Disease: Baseline hemoglobin typically around 12.  Hemoconcenration has improved.   # Secondary Hyperparathyroidism/Hyperphosphatemia: On calcitriol 0.25 MCG daily but recent PTH was 900.  Phos also high. Increased calcitriol to 0.42mcg and started sevelamer 800mg  w/ meals. Please DC on these medications.  # Vascular access: PD access working well.  # Nausea/Vomiting: possibly anxiety playing a big role. Agree with aggressive management of this. Psych to see today and hopefully can f/u outpatient. Short course of klonipin seems reasonable.   # Tachycardia: resolved with hydration  Patient is stable to discharge today from my perspective.  # Additional recommendations: - Dose all meds for creatinine clearance < 10 ml/min  - Unless absolutely necessary, no MRIs with gadolinium.  - Implement save arm precautions.  Prefer needle sticks in the dorsum of the hands or wrists.  No blood pressure measurements in arm. - If blood transfusion is requested during  hemodialysis sessions, please alert [name] prior to the session.   Recommendations were discussed with the primary team.   Subjective/interval history: Patient feels much better today.  Feels like the medication she received yesterday helped a lot.  No significant nausea or vomiting right now.  She is hungry.   Medications:  Current Facility-Administered Medications  Medication Dose Route Frequency Provider Last Rate Last Admin   0.9 %  sodium chloride infusion  250 mL Intravenous PRN Orma Flaming, MD       acetaminophen (TYLENOL) tablet 650 mg  650 mg Oral Q6H PRN Orma Flaming, MD       Or   acetaminophen (TYLENOL) suppository 650 mg  650 mg Rectal Q6H PRN Orma Flaming, MD       amLODipine (NORVASC) tablet 10 mg  10 mg Oral Daily Orma Flaming, MD   10 mg at 02/05/21 1020   calcitRIOL (ROCALTROL) capsule 0.25 mcg  0.25 mcg Oral Daily Orma Flaming, MD   0.25 mcg at 02/05/21 1020   carvedilol (COREG) tablet 25 mg  25 mg Oral BID WC Orma Flaming, MD   25 mg at 02/05/21 0801   Chlorhexidine Gluconate Cloth 2 % PADS 6 each  6 each Topical Daily Orma Flaming, MD       dialysis solution 1.5% low-MG/low-CA dianeal solution   Intraperitoneal Q24H Reesa Chew, MD   5,000 mL at 02/04/21 2110   dialysis solution 2.5% low-MG/low-CA dianeal solution   Intraperitoneal Q24H Reesa Chew, MD       dialysis solution 4.25% low-MG/low-CA dianeal solution   Intraperitoneal Q24H Reesa Chew, MD       gentamicin cream (  GARAMYCIN) 0.1 % 1 application  1 application Topical Daily Reesa Chew, MD   1 application at 60/67/70 2110   heparin injection 5,000 Units  5,000 Units Subcutaneous Q8H Orma Flaming, MD   5,000 Units at 02/05/21 0454   hydrALAZINE (APRESOLINE) injection 5 mg  5 mg Intravenous Q6H PRN Orma Flaming, MD       insulin aspart (novoLOG) injection 0-6 Units  0-6 Units Subcutaneous Q4H Shela Leff, MD   1 Units at 02/05/21 0955   insulin glargine-yfgn (SEMGLEE)  injection 10 Units  10 Units Subcutaneous QHS Shela Leff, MD   10 Units at 02/05/21 0034   LORazepam (ATIVAN) tablet 0.5 mg  0.5 mg Oral BID PRN Orma Flaming, MD   0.5 mg at 02/04/21 1653   metoCLOPramide (REGLAN) injection 5 mg  5 mg Intravenous Q6H PRN Orma Flaming, MD   5 mg at 02/04/21 1653   sevelamer carbonate (RENVELA) tablet 800 mg  800 mg Oral TID WC Reesa Chew, MD   800 mg at 02/05/21 0801   sodium chloride flush (NS) 0.9 % injection 3 mL  3 mL Intravenous Q12H Orma Flaming, MD   3 mL at 02/04/21 2211   sodium chloride flush (NS) 0.9 % injection 3 mL  3 mL Intravenous PRN Orma Flaming, MD          Review of Systems: 12 systems were reviewed and negative except per HPI  Physical Exam: Vitals:   02/05/21 0820 02/05/21 0900  BP: (!) 133/93 108/66  Pulse: 96 74  Resp: 17 20  Temp: 98.7 F (37.1 C) 97.6 F (36.4 C)  SpO2: 99% 95%   Total I/O In: 1143 [Other:1143] Out: 3403 [Other:9912]  Intake/Output Summary (Last 24 hours) at 02/05/2021 1034 Last data filed at 02/05/2021 0900 Gross per 24 hour  Intake 1143 ml  Output 9912 ml  Net -8769 ml   General: well-appearing, no acute distress HEENT: anicteric sclera, MMM CV: tachycardia, no murmurs, no edema Lungs: bilateral chest rise, normal wob Abd: soft, non-tender, non-distended, PD cath in place Skin: no visible lesions or rashes Psych: alert, engaged, appropriate mood and affect Neuro: normal speech, no gross focal deficits   Test Results Reviewed Lab Results  Component Value Date   NA 135 02/05/2021   K 3.1 (L) 02/05/2021   CL 90 (L) 02/05/2021   CO2 22 02/05/2021   BUN 61 (H) 02/05/2021   CREATININE 15.22 (H) 02/05/2021   GFR 15.72 (L) 12/21/2019   CALCIUM 8.9 02/05/2021   ALBUMIN 4.3 02/04/2021   PHOS 11.8 (H) 02/04/2021    I have reviewed relevant outside healthcare records

## 2021-02-05 NOTE — Plan of Care (Signed)

## 2021-02-05 NOTE — Consult Note (Signed)
Barbara Donovan New Face-to-FacePsychiatric Evaluation   Service Date: February 05, 2021 LOS:  LOS: 0 days    Assessment  Barbara Donovan is a 31 y.o. female admitted medically for 02/04/2021  8:59 AM for persistent nausea and vomiting. She carries the psychiatric diagnoses of depression during pregnancy and has a past medical history most significant for DMI and CKD, although recent A1cs indicateimproved control. Donovan was consulted for medication recommendations by Barbara Gaskins, MD.    Her current presentation of persistent low mood, worry, guilt, difficulties concentrating, poor appetite, and difficulty sleep is most consistent with major depressive disorder. Her anxiety symptoms (while concerning) are more consistent with an adjustment disorder and are closely tied to her son's upcoming surgery. She is not on any outpatient psychotropic medications; she has been on sertraline in the past but was not on it long enough to judge effect. Primary team had planned to start lexapro yesterday but it was not started due to qtc concerns; discussed lexapro vs mirtazapine at length with pt today and will be recommending mirtazapine (at lower dose due to CKD) due to minimal to no effect on Qtc; has additional benefits of off-label use for nausea, sleep and appetite stimulation (and shorter time to clinical effect). It should be noted that as a perotineal dialysis pt who will be less prone to qtc fluctuations after being dialyzed. It is also worth noting that pt's Qtc when measured by Fredericia (thought to be more accurate in tachycardia) is ~444 as opposed to >480. Additionally discussed short-term use of BZD. On initial examination, patient was pleasant, cooperative, and introspective. Please see plan below for detailed recommendations.   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  Plan  ## Safety and Observation Level:  - Based on my clinical evaluation, I estimate the patient to be at low risk of self harm in the current setting - At this time, we recommend a routine level of observation. This decision is based on my review of the chart including patient's history and current presentation, interview of the patient, mental status examination, and consideration of suicide risk including evaluating suicidal ideation, plan, intent, suicidal or self-harm behaviors, risk factors, and protective factors. This judgment is based on our ability to directly address suicide risk, implement suicide prevention strategies and develop a safety plan while the patient is in the clinical setting. Please contact our team if there is a concern that risk level has changed.   ## Medications:  -- s mirtazapine 7.5 mg - OK to continue lorazepam 0.5 mg BID PRN; explained to pt this is a short-term medication  - would dc with ~10 pills in light of son's upcoming surgery   ## Medical Decision Making Capacity:  Not formally assessed  ## Further Work-up:  -- none, recent TSH and B12 checked    -- most recent EKG on today had QtC of 481   ## Disposition:  -- home  Thank you for this consult request. Recommendations have been communicated to the primary team.  We will continue to follow at this time.   [name] A Chesnie Capell  NEW history  Relevant Aspects of Hospital Course:  Admitted on 02/04/2021 for intractable N/V; has several such admissions recently. .  Patient Report:  Patient seen in the afternoon; she reports "feeling OK" and clarifies that she is "trying to cope". She is worried about son's upcoming surgery -  this is a planned surgery to make his hands more functional (born w/o thumbs). Partly worried about this surgery, partly having flashbacks to his prior open heart surgery. She is also dealing with stage 5 kidney disease and the dialysis is affecting her relationship - feels like boyfriend becoming a caregiver and she has less energy to spend on her son. She has lately woken up feeling anxious when getting off of dialysis. She reflects on the losses of her parents (father passed when she was 24 and mom passed when she was 72) and states that when she was in her 35s she "didn't really care" about her life and wasn't taking good care of herself, but her son changed that (recent A1cs have all been <8sh). She clarifies that she never had suicidal ideation but didn't care (at that time) if she lived or died. She provides information in note below; engages in discussion of r/b/se of escitalopram vs mirtazapine and ultimately selected trial of mirtazapine after discussion of r/b/se. Also discussed short term use of lorazepam, addictive nature of this medication, rebound anxiety, and set joint goal of liberal use leading into son's surgery with use 2-3x/week thereafter.   She is not currently sexually active or using anythign for contraception.   She did deny SI, HI and AH/VH ROS:  Depression: poor sleep, anhedonia, significant guilt/worthlessness, decreased energy, poor concentration, decreased appetite (at least partly comorbid w/ n+v), no suicidal ideation Anxiety: more generalized and ruminative  Brief manic, psychotic, OCD screens (-)   Collateral information:  From chart  Psychiatric History:  Information collected from pt, chart Had been on zoloft ~2x, never on long enough to see effect Had been in counselling but didn't feel like she   Family psych history: Father with depression    Social History:  Lives with boyfriend On disability Parents deceased  Tobacco use: no Alcohol use: no Drug  use: no  Family History:  The patient's family history includes Aneurysm (age of onset: 49) in her mother; COPD in her father; Cataracts in her father; Diabetes in her father and mother; Healthy in her daughter and sister; Heart attack in her father; Heart disease in her father; Seizures in her mother; Stroke in her maternal grandfather.  Medical History: Past Medical History:  Diagnosis Date   Asthma    as a child, no problems as an adult, no inhaler   Cataract    NS OU   Chronic hypertension during pregnancy, antepartum 08/19/2017   Dehydration 01/28/2018   Depression during pregnancy, antepartum 07/07/2017   6/20: Short trial of zoloft previously, reports didn't help much but also didn't give it a chance Discussed r/b/a SSRIs in pregnancy, agrees to try Zoloft again, rx sent No SI/HI/red flags   Diabetes ([place])    TYPE I. A1C 7.5% 05/31/20   Diabetic retinopathy ([place]) 06/09/2017   07/2017 with bilateral severe diabetic non-proliferative retinopathy with macular edema.   ESRD (end stage renal disease) on dialysis ([place])    4 x week  M T TH F   HTN (hypertension)    Hypertensive retinopathy    OU   Hypokalemia 01/22/2018   Hypomagnesemia 01/28/2018   Intractable nausea and vomiting 01/22/2018  Intrauterine growth restriction (IUGR) affecting care of mother 12/22/2017   Morbid obesity ([place])    Nephropathy, diabetic ([place]) 12/29/2017   Peritoneal dialysis status ([place])    Proteinuria due to type 1 diabetes mellitus ([place]) 11/02/2017   Baseline Pr: Cr 10.23   Severe hyperemesis gravidarum 10/30/2017   Type I diabetes mellitus ([place]) 07/07/2017   Current Diabetic Medications:  Insulin  [x]  Aspirin 81 mg daily after 12 weeks (? A2/B GDM)  Required Referrals for A1GDM or A2GDM: [x]  Diabetes Education and Testing Supplies [x]  Nutrition Cousult  For A2/B GDM or higher classes of DM [x]  Diabetes Education and Testing Supplies [x]  Nutrition Counsult [x]  Fetal ECHO after 20 weeks  [x]  Eye exam for  retina evaluation - severe retinopathy 7/19  Base   Ventricular septal defect (VSD) of fetus in singleton pregnancy, antepartum 09/30/2017   May go to newborn nursery per Dr. Lenard Simmer Echo prior to discharge    Surgical History: Past Surgical History:  Procedure Laterality Date   25 GAUGE PARS PLANA VITRECTOMY WITH 20 GAUGE MVR PORT FOR MACULAR HOLE Left 07/20/2018   Procedure: 25 GAUGE PARS PLANA VITRECTOMY LEFT EYE;  Surgeon: Bernarda Caffey, MD;  Location: [place] [facility];  Service: Ophthalmology;  Laterality: Left;   CAPD INSERTION N/A 09/10/2020   Procedure: LAPAROSCOPIC INSERTION CONTINUOUS AMBULATORY PERITONEAL DIALYSIS  (CAPD) CATHETER;  Surgeon: Serafina Mitchell, MD;  Location: [facility];  Service: Vascular;  Laterality: N/A;   CESAREAN SECTION N/A 12/26/2017   Procedure: CESAREAN SECTION;  Surgeon: Osborne Oman, MD;  Location: [facility];  Service: Obstetrics;  Laterality: N/A;   EYE EXAMINATION UNDER ANESTHESIA Right 07/20/2018   Procedure: Eye Exam Under Anesthesia RIGHT EYE;  Surgeon: Bernarda Caffey, MD;  Location: [place] [facility];  Service: Ophthalmology;  Laterality: Right;   EYE SURGERY Left 07/2018   GAS INSERTION Left 07/19/2019   Procedure: INSERTION OF GAS;  Surgeon: Bernarda Caffey, MD;  Location: [place];  Service: Ophthalmology;  Laterality: Left;  SF6   INJECTION OF SILICONE OIL Left 05/20/8411   Procedure: Injection Of Silicone Oil LEFT EYE;  Surgeon: Bernarda Caffey, MD;  Location: [facility];  Service: Ophthalmology;  Laterality: Left;   IR FLUORO GUIDE CV LINE RIGHT  06/02/2020   IR US GUIDE VASC ACCESS RIGHT  06/02/2020   LASER PHOTO ABLATION Right 07/20/2018   Procedure: Laser Photo Ablation RIGHT EYE;  Surgeon: Bernarda Caffey, MD;  Location: [place] [facility];  Service: Ophthalmology;  Laterality: Right;   MEMBRANE PEEL Left 07/20/2018   Procedure: Membrane Peel LEFT EYE;  Surgeon: Bernarda Caffey, MD;  Location: [place];  Service: Ophthalmology;  Laterality: Left;   MEMBRANE PEEL Left 07/19/2019   Procedure: MEMBRANE  PEEL;  Surgeon: Bernarda Caffey, MD;  Location: [place];  Service: Ophthalmology;  Laterality: Left;   MITOMYCIN C APPLICATION Bilateral 02/22/4008   Procedure: Avastin Application;  Surgeon: Bernarda Caffey, MD;  Location: [facility];  Service: Ophthalmology;  Laterality: Bilateral;   PHOTOCOAGULATION WITH LASER Left 07/20/2018   Procedure: Photocoagulation With Laser LEFT EYE;  Surgeon: Bernarda Caffey, MD;  Location: [place];  Service: Ophthalmology;  Laterality: Left;   PHOTOCOAGULATION WITH LASER Left 07/19/2019   Procedure: PHOTOCOAGULATION WITH LASER;  Surgeon: Bernarda Caffey, MD;  Location: [place];  Service: Ophthalmology;  Laterality: Left;   RETINAL DETACHMENT SURGERY Left 07/20/2018   Dr. Bernarda Caffey   SILICON OIL REMOVAL Left 07/19/2019   Procedure: 25g PARS PLANA VITRECTOMY WITH SILICON OIL REMOVAL;  Surgeon: Bernarda Caffey, MD;  Location: [facility];  Service: Ophthalmology;  Laterality: Left;   WISDOM TOOTH EXTRACTION      Medications:   Current Facility-Administered Medications:    0.9 %  sodium chloride infusion, 250 mL, Intravenous, PRN, Orma Flaming, MD   acetaminophen (TYLENOL) tablet 650 mg, 650 mg, Oral, Q6H PRN **OR** acetaminophen (TYLENOL) suppository 650 mg, 650 mg, Rectal, Q6H PRN, Orma Flaming, MD   amLODipine (NORVASC) tablet 10 mg, 10 mg, Oral, Daily, Orma Flaming, MD, 10 mg at 02/05/21 1020   calcitRIOL (ROCALTROL) capsule 0.25 mcg, 0.25 mcg, Oral, Daily, Orma Flaming, MD, 0.25 mcg at 02/05/21 1020   carvedilol (COREG) tablet 25 mg, 25 mg, Oral, BID WC, Orma Flaming, MD, 25 mg at 02/05/21 0801   Chlorhexidine Gluconate Cloth 2 % PADS 6 each, 6 each, Topical, Daily, Orma Flaming, MD, 6 each at 02/05/21 1015   dialysis solution 1.5% low-MG/low-CA dianeal solution, , Intraperitoneal, Q24H, Reesa Chew, MD, 5,000 mL at 02/04/21 2110   dialysis solution 2.5% low-MG/low-CA dianeal solution, , Intraperitoneal, Q24H, Peeples, Shaune Pollack, MD   dialysis solution 4.25% low-MG/low-CA  dianeal solution, , Intraperitoneal, Q24H, Joylene Grapes, Shaune Pollack, MD   gentamicin cream (GARAMYCIN) 0.1 % 1 application, 1 application, Topical, Daily, Reesa Chew, MD, 1 application at 53/97/67 1030   heparin injection 5,000 Units, 5,000 Units, Subcutaneous, Q8H, Orma Flaming, MD, 5,000 Units at 02/05/21 1349   hydrALAZINE (APRESOLINE) injection 5 mg, 5 mg, Intravenous, Q6H PRN, Orma Flaming, MD   insulin aspart (novoLOG) injection 0-6 Units, 0-6 Units, Subcutaneous, Q4H, Shela Leff, MD, 1 Units at 02/05/21 1248   insulin glargine-yfgn (SEMGLEE) injection 10 Units, 10 Units, Subcutaneous, QHS, Shela Leff, MD, 10 Units at 02/05/21 0034   LORazepam (ATIVAN) tablet 0.5 mg, 0.5 mg, Oral, BID PRN, Orma Flaming, MD, 0.5 mg at 02/04/21 1653   metoCLOPramide (REGLAN) injection 5 mg, 5 mg, Intravenous, Q6H PRN, Orma Flaming, MD, 5 mg at 02/04/21 1653   mirtazapine (REMERON) tablet 7.5 mg, 7.5 mg, Oral, QHS, Kollen Armenti A   sevelamer carbonate (RENVELA) tablet 800 mg, 800 mg, Oral, TID WC, Reesa Chew, MD, 800 mg at 02/05/21 1248   sodium chloride flush (NS) 0.9 % injection 3 mL, 3 mL, Intravenous, Q12H, Orma Flaming, MD, 3 mL at 02/05/21 0950   sodium chloride flush (NS) 0.9 % injection 3 mL, 3 mL, Intravenous, PRN, Orma Flaming, MD  Allergies: No Known Allergies     Objective  Vital signs:  Temp:  [97.6 F (36.4 C)-99.6 F (37.6 C)] 98.5 F (36.9 C) (01/19 1125) Pulse Rate:  [74-114] 91 (01/19 1125) Resp:  [12-21] 14 (01/19 1125) BP: (104-176)/(66-118) 115/88 (01/19 1125) SpO2:  [95 %-100 %] 98 % (01/19 1200) Weight:  [61 kg-64.4 kg] 61 kg (01/19 0900)   Psychiatric Specialty Exam:  Presentation  General Appearance: Appropriate for Environment Eye Contact:Good Speech:Clear and Coherent Speech Volume:Normal Handedness:No data recorded  Mood and Affect  Mood:Anxious; Depressed Affect:Congruent (brigtened briefly when  appropriate)  Thought Process  Thought Processes:Coherent Descriptions of Associations:Intact  Orientation:Full (Time, Place and Person)  Thought Content:-- (devoid of SI, HI, paranoia or delusions)  History of Schizophrenia/Schizoaffective disorder:No data recorded Duration of Psychotic Symptoms:No data recorded Hallucinations:Hallucinations: None  Ideas of Reference:None  Suicidal Thoughts:Suicidal Thoughts: No  Homicidal Thoughts:Homicidal Thoughts: No   Sensorium  Memory:Immediate Good; Recent Good; Remote Good Judgment:Good Insight:Good  Executive Functions  Concentration:Good Attention Span:Good Recall:Good Fund of Knowledge:Good Language:Good  Psychomotor Activity  Psychomotor Activity:Psychomotor Activity: Normal  Assets  Assets:Communication Skills; Desire for Improvement; Intimacy; Resilience  Sleep  Sleep:Sleep: Poor   Physical Exam: Physical Exam HENT:     Head: Normocephalic.     Comments: Distinctive glasses  Eyes:     Conjunctiva/sclera: Conjunctivae normal.  Pulmonary:     Effort: Pulmonary effort is normal.  Neurological:     Mental Status: She is alert and oriented to person, place, and time.  Psychiatric:        Judgment: Judgment normal.   ROS Blood pressure 115/88, pulse 91, temperature 98.5 F (36.9 C), temperature source Oral, resp. rate 14, weight 61 kg, SpO2 98 %. Body mass index is 21.71 kg/m.

## 2021-02-05 NOTE — Care Management Obs Status (Signed)
[place] NOTIFICATION   Patient Details  Name: Barbara Donovan MRN: [id number] Date of Birth: 18-Mar-1990   Medicare Observation Status Notification Given:  Yes Notice given over the phone due to remote. Permission given to sign, notice on patient instruction to print out upon [place], RN 02/05/2021, 11:42 AM

## 2021-02-05 NOTE — Plan of Care (Signed)
°  Problem: Education: Goal: Expressions of having a comfortable level of knowledge regarding the disease process will increase Outcome: Adequate for Discharge   Problem: Coping: Goal: Ability to adjust to condition or change in health will improve Outcome: Adequate for Discharge Goal: Ability to identify appropriate support needs will improve Outcome: Adequate for Discharge   Problem: Health Behavior/Discharge Planning: Goal: Compliance with prescribed medication regimen will improve Outcome: Adequate for Discharge   Problem: Medication: Goal: Risk for medication side effects will decrease Outcome: Adequate for Discharge   Problem: Clinical Measurements: Goal: Complications related to the disease process, condition or treatment will be avoided or minimized Outcome: Adequate for Discharge Goal: Diagnostic test results will improve Outcome: Adequate for Discharge   Problem: Safety: Goal: Verbalization of understanding the information provided will improve Outcome: Adequate for Discharge   Problem: Self-Concept: Goal: Level of anxiety will decrease Outcome: Adequate for Discharge Goal: Ability to verbalize feelings about condition will improve Outcome: Adequate for Discharge

## 2021-02-05 NOTE — Progress Notes (Signed)
Nursing dc note  Patient alert and oriented, verbalized understanding of dc instrutions. Ccmd notified of patients dc order. All belongings and dc papers also given to patient. She is now awaiting for family to come to take her home. Primary RN made aware.

## 2021-02-05 NOTE — Discharge Instructions (Signed)
Medicare Outpatient Observation Notice   Patient name:  MALYAH OHLRICH Patient number:  [id number]                                                                                                                                                                       [facility] a hospital outpatient receiving observation services. You are not an inpatient because:    irretractable nausea   You require hospital care for evaluation and/or treatment.  It is expected you will need hospital care for less than a total of two days.                                                                                                                                                                         Being an outpatient may affect what you pay in a hospital:   When youre a hospital outpatient, your observation stay is covered under Medicare Part B.   For Part B services, you generally pay:   A copayment for each outpatient hospital service you get. Part B copayments may vary by type of service.   20% of the Medicare-approved amount for most doctor services, after the Part B deductible.   Observation services may affect coverage and payment of your care after you leave the hospital:     If you need skilled nursing facility (SNF) care after you leave the hospital, Medicare Part A will only cover SNF care if youve had a 3-day minimum, medically necessary, inpatient hospital stay for a related illness or injury. An inpatient hospital stay begins the day the hospital admits you as an inpatient based on a doctors order and doesnt include the day youre discharged.   If you have Medicaid, a Medicare Advantage plan or other health plan, Medicaid or the plan may have different rules for SNF coverage after you leave the hospital. Check with Medicaid or your plan.   NOTE: Medicare Part A generally doesnt cover outpatient hospital services,  like an observation stay. However, Part A will generally cover  medically necessary inpatient services if the hospital admits you as an inpatient based on a doctors order. In most cases, youll pay a one-time deductible for all of your inpatient hospital services for the first 60 days youre in a hospital.                                                                                                                                                                      If you have any questions about your observation services, ask the hospital staff member giving you this notice or the doctor providing your hospital care. You can also ask to speak with someone from the hospitals utilization or discharge planning department.   You can also call 1-800-MEDICARE (1-9365734247).  TTY users should call [phone].   Form CMS 71062-IRSW   Expiration 01/17/2021 [employer] APPROVAL [id number]          Your costs for medications:     Generally, prescription and over-the-counter drugs, including self-administered drugs, you get in a hospital outpatient setting (like an emergency department) arent covered by Part B. Self- administered drugs are drugs youd normally take on your own. For safety reasons, many hospitals dont allow you to take medications brought from home. If you have a Medicare prescription drug plan (Part D), your plan may help you pay for these drugs. Youll likely need to pay out-of- pocket for these drugs and submit a claim to your drug plan for a refund. Contact your drug plan for more information.                                                                                                                                                                        If youre enrolled in a Medicare Advantage plan (like an HMO or PPO) or other Medicare health plan (Part C), your costs and coverage may be different. Check with your plan to find out about coverage  for outpatient observation services.    If youre a Chief of Staff  through your state Medicaid program, you cant be billed for Part A or Part B deductibles, coinsurance, and copayments.                                                                                                                                                                      Additional Information (Optional):                                                                                                                                                                                Please sign below to show you received and understand this notice.                                         Date: 02/05/21 / Time:11:41 AM   CMS does not discriminate in its programs and activities. To request this publication in alternative format, please call: 1-800-MEDICARE or email:[email or web address].   Form CMS 65784-ONGE   Expiration 01/17/2021 [employer] APPROVAL [id number]      Patient   Add No image attached Trace Slow Corrupt Edit Data Change Template Print On

## 2021-02-08 LAB — BODY FLUID CULTURE W GRAM STAIN: Culture: NO GROWTH

## 2021-02-18 ENCOUNTER — Emergency Department (HOSPITAL_COMMUNITY)
Admission: EM | Admit: 2021-02-18 | Discharge: 2021-02-19 | Disposition: A | Discharge status: Home / Self Care | Payer: 59 | Attending: Emergency Medicine | Admitting: Emergency Medicine

## 2021-02-18 ENCOUNTER — Other Ambulatory Visit: Payer: Self-pay

## 2021-02-18 ENCOUNTER — Encounter (HOSPITAL_COMMUNITY): Payer: Self-pay | Admitting: Emergency Medicine

## 2021-02-18 DIAGNOSIS — R079 Chest pain, unspecified: Secondary | ICD-10-CM | POA: Diagnosis not present

## 2021-02-18 DIAGNOSIS — Z79899 Other long term (current) drug therapy: Secondary | ICD-10-CM | POA: Insufficient documentation

## 2021-02-18 DIAGNOSIS — E1022 Type 1 diabetes mellitus with diabetic chronic kidney disease: Secondary | ICD-10-CM | POA: Diagnosis not present

## 2021-02-18 DIAGNOSIS — R112 Nausea with vomiting, unspecified: Secondary | ICD-10-CM | POA: Diagnosis not present

## 2021-02-18 DIAGNOSIS — I120 Hypertensive chronic kidney disease with stage 5 chronic kidney disease or end stage renal disease: Secondary | ICD-10-CM | POA: Insufficient documentation

## 2021-02-18 DIAGNOSIS — N186 End stage renal disease: Secondary | ICD-10-CM | POA: Diagnosis not present

## 2021-02-18 DIAGNOSIS — R000 Tachycardia, unspecified: Secondary | ICD-10-CM | POA: Diagnosis not present

## 2021-02-18 DIAGNOSIS — Z992 Dependence on renal dialysis: Secondary | ICD-10-CM | POA: Diagnosis not present

## 2021-02-18 LAB — COMPREHENSIVE METABOLIC PANEL
ALT: 14 U/L (ref 0–44)
AST: 14 U/L — ABNORMAL LOW (ref 15–41)
Albumin: 3.4 g/dL — ABNORMAL LOW (ref 3.5–5.0)
Alkaline Phosphatase: 60 U/L (ref 38–126)
Anion gap: 15 (ref 5–15)
BUN: 63 mg/dL — ABNORMAL HIGH (ref 6–20)
CO2: 20 mmol/L — ABNORMAL LOW (ref 22–32)
Calcium: 10.3 mg/dL (ref 8.9–10.3)
Chloride: 98 mmol/L (ref 98–111)
Creatinine, Ser: 8.75 mg/dL — ABNORMAL HIGH (ref 0.44–1.00)
GFR, Estimated: 6 mL/min — ABNORMAL LOW (ref >60)
Glucose, Bld: 153 mg/dL — ABNORMAL HIGH (ref 70–99)
Potassium: 5.4 mmol/L — ABNORMAL HIGH (ref 3.5–5.1)
Sodium: 133 mmol/L — ABNORMAL LOW (ref 135–145)
Total Bilirubin: 0.4 mg/dL (ref 0.3–1.2)
Total Protein: 7.5 g/dL (ref 6.5–8.1)

## 2021-02-18 LAB — CBC WITH DIFFERENTIAL/PLATELET
Abs Immature Granulocytes: 0.01 10*3/uL (ref 0.00–0.07)
Basophils Absolute: 0.1 10*3/uL (ref 0.0–0.1)
Basophils Relative: 1 %
Eosinophils Absolute: 0.1 10*3/uL (ref 0.0–0.5)
Eosinophils Relative: 2 %
HCT: 41.2 % (ref 36.0–46.0)
Hemoglobin: 12.9 g/dL (ref 12.0–15.0)
Immature Granulocytes: 0 %
Lymphocytes Relative: 20 %
Lymphs Abs: 1.7 10*3/uL (ref 0.7–4.0)
MCH: 27.6 pg (ref 26.0–34.0)
MCHC: 31.3 g/dL (ref 30.0–36.0)
MCV: 88.2 fL (ref 80.0–100.0)
Monocytes Absolute: 0.4 10*3/uL (ref 0.1–1.0)
Monocytes Relative: 4 %
Neutro Abs: 6.3 10*3/uL (ref 1.7–7.7)
Neutrophils Relative %: 73 %
Platelets: 429 10*3/uL — ABNORMAL HIGH (ref 150–400)
RBC: 4.67 MIL/uL (ref 3.87–5.11)
RDW: 13.1 % (ref 11.5–15.5)
WBC: 8.5 10*3/uL (ref 4.0–10.5)
nRBC: 0 % (ref 0.0–0.2)

## 2021-02-18 LAB — LIPASE, BLOOD: Lipase: 110 U/L — ABNORMAL HIGH (ref 11–51)

## 2021-02-18 MED ORDER — SODIUM CHLORIDE 0.9 % IV BOLUS
500.0000 mL | Freq: Once | INTRAVENOUS | Status: AC
  Administered 2021-02-18: 500 mL via INTRAVENOUS

## 2021-02-18 MED ORDER — HALOPERIDOL LACTATE 5 MG/ML IJ SOLN
2.5000 mg | Freq: Once | INTRAMUSCULAR | Status: AC
Start: 2021-02-18 — End: 2021-02-18
  Administered 2021-02-18: 2.5 mg via INTRAVENOUS
  Filled 2021-02-18: qty 1

## 2021-02-18 MED ORDER — SODIUM CHLORIDE 0.9 % IV BOLUS: 1000.0000 mL | Freq: Once | INTRAVENOUS | Status: DC

## 2021-02-18 MED ORDER — FAMOTIDINE IN NACL 20-0.9 MG/50ML-% IV SOLN
20.0000 mg | Freq: Once | INTRAVENOUS | Status: AC
  Administered 2021-02-18: 20 mg via INTRAVENOUS
  Filled 2021-02-18: qty 50

## 2021-02-18 MED ORDER — METOCLOPRAMIDE HCL 5 MG/ML IJ SOLN
10.0000 mg | Freq: Once | INTRAMUSCULAR | Status: AC
Start: 2021-02-18 — End: 2021-02-18
  Administered 2021-02-18: 10 mg via INTRAMUSCULAR
  Filled 2021-02-18: qty 2

## 2021-02-18 NOTE — ED Triage Notes (Signed)
Patient coming from home, complaint of n/v, CP that started today.

## 2021-02-18 NOTE — ED Provider Notes (Signed)
[facility] EMERGENCY DEPARTMENT Provider Note   CSN: [id number] Arrival date & time: 02/18/21  1922     History  Chief Complaint  Patient presents with   Nausea   Emesis   Chest Pain    Barbara Donovan is a 31 y.o. female with a past medical history of type 1 diabetes, hypertension, end-stage renal disease on peritoneal dialysis with last dialysis overnight last night.  She has a history of gastroparesis.  Patient states that today she began vomiting and has had intractable vomiting without abdominal pain fever diarrhea all day long.  She was unable to hold down any of her regular medications   Emesis Chest Pain Associated symptoms: vomiting       Home Medications Prior to Admission medications   Medication Sig Start Date End Date Taking? Authorizing Provider  acetaminophen (TYLENOL) 500 MG tablet Take 1,000 mg by mouth every 6 (six) hours as needed for mild pain.   Yes [provider]  amLODipine (NORVASC) 10 MG tablet Take 1 tablet (10 mg total) by mouth daily. 03/09/20  Yes Mercy Riding, MD  calcitRIOL (ROCALTROL) 0.5 MCG capsule Take 0.5 mcg by mouth daily. 12/02/20  Yes [provider]  carvedilol (COREG) 25 MG tablet Take 1 tablet (25 mg total) by mouth 2 (two) times daily. 03/07/19  Yes Imogene Burn, PA-C  clonazePAM (KLONOPIN) 0.5 MG tablet Take 1 tablet (0.5 mg total) by mouth 2 (two) times daily as needed for anxiety. 02/05/21  Yes Bonnielee Haff, MD  gentamicin cream (GARAMYCIN) 0.1 % Apply 1 application topically daily. 09/10/20  Yes [provider]  glucose blood (ONETOUCH VERIO) test strip 1 each by Other route 3 (three) times daily. And lancets 3/day 11/28/20  Yes Renato Shin, MD  heparin sodium, porcine, 1000 UNIT/ML injection Inject 3 mLs into the vein daily. 01/20/21  Yes [provider]  hydrALAZINE (APRESOLINE) 50 MG tablet Take 1 tablet (50 mg total) by mouth 3 (three) times daily. 12/31/20  Yes Domenic Polite, MD  insulin glargine (LANTUS SOLOSTAR) 100 UNIT/ML Solostar Pen Inject 20 Units into the skin every morning. 02/05/21  Yes Bonnielee Haff, MD  insulin lispro (HUMALOG KWIKPEN) 100 UNIT/ML KwikPen Inject 3 Units into the skin 3 (three) times daily with meals. 11/28/20  Yes Renato Shin, MD  losartan (COZAAR) 50 MG tablet Take 2 tablets (100 mg total) by mouth daily. Patient taking differently: Take 50 mg by mouth daily. 06/09/20  Yes Geradine Girt, DO  metoCLOPramide (REGLAN) 5 MG tablet Take 1 tablet (5 mg total) by mouth 3 (three) times daily before meals. 02/05/21  Yes Bonnielee Haff, MD  mirtazapine (REMERON) 7.5 MG tablet Take 1 tablet (7.5 mg total) by mouth at bedtime. 02/05/21  Yes Bonnielee Haff, MD  multivitamin (RENA-VIT) TABS tablet Take 1 tablet by mouth at bedtime. 03/08/20  Yes Mercy Riding, MD  ondansetron (ZOFRAN ODT) 4 MG disintegrating tablet Take 1 tablet (4 mg total) by mouth every 8 (eight) hours as needed for nausea or vomiting. 11/13/20  Yes Thurnell Lose, MD  promethazine (PHENERGAN) 25 MG suppository Place 1 suppository (25 mg total) rectally every 6 (six) hours as needed for nausea or vomiting. 02/19/21  Yes Dorota Heinrichs, PA-C  sevelamer carbonate (RENVELA) 800 MG tablet Take 1 tablet (800 mg total) by mouth 3 (three) times daily with meals. 02/05/21  Yes Bonnielee Haff, MD  Continuous Blood Gluc Sensor MISC 1 each by Does not apply route  as directed. Use as directed every 14 days. May dispense FreeStyle [employer] or similar. 01/27/20   Antonieta Pert, MD      Allergies    Patient has no known allergies.    Review of Systems   Review of Systems  Cardiovascular:  Positive for chest pain.  Gastrointestinal:  Positive for vomiting.   Physical Exam Updated Vital Signs BP (!) 147/95    Pulse (!) 101    Temp 99.4 F (37.4 C) (Oral)    Resp 15    SpO2 99%  Physical Exam Vitals and nursing note reviewed.  Constitutional:      General: She is not in  acute distress.    Appearance: She is well-developed. She is not diaphoretic.  HENT:     Head: Normocephalic and atraumatic.     Right Ear: External ear normal.     Left Ear: External ear normal.     Nose: Nose normal.     Mouth/Throat:     Mouth: Mucous membranes are moist.  Eyes:     General: No scleral icterus.    Extraocular Movements: Extraocular movements intact.     Conjunctiva/sclera: Conjunctivae normal.     Pupils: Pupils are equal, round, and reactive to light.  Cardiovascular:     Rate and Rhythm: Regular rhythm. Tachycardia present.     Heart sounds: Normal heart sounds. No murmur heard.   No friction rub. No gallop.  Pulmonary:     Effort: Pulmonary effort is normal. No respiratory distress.     Breath sounds: Normal breath sounds.  Abdominal:     General: Bowel sounds are normal. There is no distension.     Palpations: Abdomen is soft. There is no mass.     Tenderness: There is no abdominal tenderness. There is no guarding.  Musculoskeletal:     Cervical back: Normal range of motion.  Skin:    General: Skin is warm and dry.  Neurological:     Mental Status: She is alert and oriented to person, place, and time.  Psychiatric:        Behavior: Behavior normal.    ED Results / Procedures / Treatments   Labs (all labs ordered are listed, but only abnormal results are displayed) Labs Reviewed  CBC WITH DIFFERENTIAL/PLATELET - Abnormal; Notable for the following components:      Result Value   Platelets 429 (*)    All other components within normal limits  COMPREHENSIVE METABOLIC PANEL - Abnormal; Notable for the following components:   Sodium 133 (*)    Potassium 5.4 (*)    CO2 20 (*)    Glucose, Bld 153 (*)    BUN 63 (*)    Creatinine, Ser 8.75 (*)    Albumin 3.4 (*)    AST 14 (*)    GFR, Estimated 6 (*)    All other components within normal limits  LIPASE, BLOOD - Abnormal; Notable for the following components:   Lipase 110 (*)    All other  components within normal limits    EKG None  Radiology No results found.  Procedures Procedures    Medications Ordered in ED Medications  metoCLOPramide (REGLAN) injection 10 mg (10 mg Intramuscular Given 02/18/21 2231)  sodium chloride 0.9 % bolus 500 mL (0 mLs Intravenous Stopped 02/19/21 0107)  haloperidol lactate (HALDOL) injection 2.5 mg (2.5 mg Intravenous Given 02/18/21 2314)  famotidine (PEPCID) IVPB 20 mg premix (0 mg Intravenous Stopped 02/19/21 0106)  sodium zirconium cyclosilicate (  LOKELMA) packet 5 g (5 g Oral Given 02/19/21 0411)    ED Course/ Medical Decision Making/ A&P Clinical Course as of 02/19/21 0459  Thu Feb 19, 2021  0427 Case discussed with Johnney Ou of Nephrology- patient can be dc to go home and do PD. Recommends one dose of 5 mg lokelma. [AH]    Clinical Course User Index [AH] Margarita Mail, PA-C                           Medical Decision Making Amount and/or Complexity of Data Reviewed Labs: ordered.  Risk Prescription drug management.   This patient presents to the ED for concern of vomiting, this involves an extensive number of treatment options, and is a complaint that carries with it a high risk of complications and morbidity.  The differential diagnosis includes The emergent differential diagnosis for vomiting includes, but is not limited to ACS/MI, DKA, Ischemic bowel, , Sepsis,  Bowel obstruction/ileus, Cholecystitis, Electrolyte abnormalities, Elevated ICP, Gastric outlet obstruction, Pancreatitis, Ruptured viscus, Biliary colic, Cannabinoid hyperemesis syndrome, Gastritis, Gastroenteritis, Gastroparesis,  Narcotic withdrawal, Peptic ulcer disease, and UTI    Co morbidities that complicate the patient evaluation  End-stage renal disease on peritoneal dialysis, recurrent episodes of vomiting   Additional history obtained:   Lab Tests:  I Ordered, and personally interpreted labs.  The pertinent results include:   CBC without elevated white  blood cell count, mildly elevated platelet count likely acute phase reactant.  CMP with blood glucose of 153, creatinine of 8.75 consistent with history of end-stage renal disease, potassium of 5.4, lipase elevated at 110 without abdominal tenderness   Imaging Studies ordered:  logist interpretation   Cardiac Monitoring:  The patient was maintained on a cardiac monitor.  I personally viewed and interpreted the cardiac monitored which showed an underlying rhythm of: Normal sinus rhythm, QT of 440   Medicines ordered and prescription drug management:  I ordered medication including Reglan and Haldol, fluid resuscitation and Lokelma for vomiting, dehydration and hyper kalemia Reevaluation of the patient after these medicines showed that the patient improved I have reviewed the patients home medicines and have made adjustments as needed     Critical Interventions:  Lokelma for mild hyperkalemia   Consultations Obtained:  I requested consultation with the nephrology,  and discussed lab and imaging findings as well as pertinent plan - they recommend: Discharge after dose of Lokelma   Problem List / ED Course:  Recurrent, non-intractable vomiting   Reevaluation:  After the interventions noted above, I reevaluated the patient and found that they have : Patient tolerating p.o. fluids, nausea is resolved    Dispostion:  After consideration of the diagnostic results and the patients response to treatment, I feel that the patent would benefit from discharge.  Final Clinical Impression(s) / ED Diagnoses Final diagnoses:  Nausea and vomiting, unspecified vomiting type    Rx / DC Orders ED Discharge Orders          Ordered    promethazine (PHENERGAN) 25 MG suppository  Every 6 hours PRN        02/19/21 0426              Margarita Mail, PA-C 02/19/21 0459    Maudie Flakes, MD 02/19/21 [phone]

## 2021-02-18 NOTE — ED Provider Triage Note (Signed)
Emergency Medicine Provider Triage Evaluation Note  Barbara Donovan , a 31 y.o. female  was evaluated in triage.  Patient has a history of diabetes, gastroparesis, ESRD.  Pt complains of nausea and vomiting starting today, unrelieved with home Reglan.  She has associated pain in her chest.  Actively vomiting in triage.  Review of Systems  Positive: Nausea, vomiting Negative: Fever  Physical Exam  BP (!) 173/109 (BP Location: Right Arm)    Pulse (!) 111    Temp 99.4 F (37.4 C) (Oral)    Resp 18    SpO2 99%  Gen:   Awake, actively vomiting Resp:  Normal effort  MSK:   Moves extremities without difficulty  Other:    Medical Decision Making  Medically screening exam initiated at 8:03 PM.  Appropriate orders placed.  Barbara Donovan was informed that the remainder of the evaluation will be completed by another provider, this initial triage assessment does not replace that evaluation, and the importance of remaining in the ED until their evaluation is complete.  IM Reglan ordered until patient can be moved to the back and IV started.   Barbara Cater, PA-C 02/18/21 2004

## 2021-02-19 DIAGNOSIS — R112 Nausea with vomiting, unspecified: Secondary | ICD-10-CM | POA: Diagnosis not present

## 2021-02-19 MED ORDER — PROMETHAZINE HCL 25 MG RE SUPP: 25.0000 mg | Freq: Four times a day (QID) | RECTAL | 0 refills | Status: DC | PRN

## 2021-02-19 MED ORDER — CARVEDILOL 12.5 MG PO TABS: 25.0000 mg | ORAL_TABLET | Freq: Once | ORAL | Status: DC

## 2021-02-19 MED ORDER — HYDRALAZINE HCL 25 MG PO TABS: 50.0000 mg | ORAL_TABLET | Freq: Once | ORAL | Status: DC

## 2021-02-19 MED ORDER — SODIUM ZIRCONIUM CYCLOSILICATE 5 G PO PACK
5.0000 g | PACK | Freq: Once | ORAL | Status: AC
  Administered 2021-02-19: 5 g via ORAL
  Filled 2021-02-19: qty 1

## 2021-02-19 MED ORDER — AMLODIPINE BESYLATE 5 MG PO TABS: 10.0000 mg | ORAL_TABLET | Freq: Once | ORAL | Status: DC

## 2021-02-19 MED ORDER — LOSARTAN POTASSIUM 50 MG PO TABS: 100.0000 mg | ORAL_TABLET | Freq: Once | ORAL | Status: DC

## 2021-02-19 NOTE — Discharge Instructions (Addendum)
Please continue with your regular home PD treatment.  Continue frequent small sips (10-20 ml) of clear liquids every 5-10 minutes. Gatorade or powerade are good options. Avoid milk, orange juice, and grape juice for now. . Once you have not had further vomiting with the small sips for 4 hours, you may begin to drink larger volumes of fluids at a time and try a bland diet which may include saltine crackers, applesauce, breads, pastas, bananas, bland chicken. If you continues to vomit despite medication, return to the ED for repeat evaluation. Otherwise, follow up with your doctor in 2-3 days for a re-check.

## 2021-02-23 ENCOUNTER — Encounter (HOSPITAL_COMMUNITY): Payer: Self-pay

## 2021-02-23 ENCOUNTER — Inpatient Hospital Stay (HOSPITAL_COMMUNITY)
Admission: EM | Admit: 2021-02-23 | Discharge: 2021-02-25 | DRG: 073 | Disposition: A | Discharge status: Home / Self Care | Payer: 59 | Attending: Internal Medicine | Admitting: Internal Medicine

## 2021-02-23 ENCOUNTER — Emergency Department (HOSPITAL_COMMUNITY)
Admission: EM | Admit: 2021-02-23 | Discharge: 2021-02-23 | Disposition: A | Discharge status: Home / Self Care | Payer: 59 | Source: Home / Self Care | Attending: Emergency Medicine | Admitting: Emergency Medicine

## 2021-02-23 ENCOUNTER — Other Ambulatory Visit: Payer: Self-pay

## 2021-02-23 DIAGNOSIS — K3184 Gastroparesis: Secondary | ICD-10-CM | POA: Diagnosis present

## 2021-02-23 DIAGNOSIS — Z833 Family history of diabetes mellitus: Secondary | ICD-10-CM

## 2021-02-23 DIAGNOSIS — E872 Acidosis, unspecified: Secondary | ICD-10-CM | POA: Diagnosis present

## 2021-02-23 DIAGNOSIS — I10 Essential (primary) hypertension: Secondary | ICD-10-CM | POA: Diagnosis present

## 2021-02-23 DIAGNOSIS — Z79899 Other long term (current) drug therapy: Secondary | ICD-10-CM

## 2021-02-23 DIAGNOSIS — Z794 Long term (current) use of insulin: Secondary | ICD-10-CM | POA: Insufficient documentation

## 2021-02-23 DIAGNOSIS — F419 Anxiety disorder, unspecified: Secondary | ICD-10-CM | POA: Diagnosis present

## 2021-02-23 DIAGNOSIS — E1143 Type 2 diabetes mellitus with diabetic autonomic (poly)neuropathy: Secondary | ICD-10-CM

## 2021-02-23 DIAGNOSIS — M898X9 Other specified disorders of bone, unspecified site: Secondary | ICD-10-CM | POA: Diagnosis present

## 2021-02-23 DIAGNOSIS — E1022 Type 1 diabetes mellitus with diabetic chronic kidney disease: Secondary | ICD-10-CM | POA: Insufficient documentation

## 2021-02-23 DIAGNOSIS — E1065 Type 1 diabetes mellitus with hyperglycemia: Secondary | ICD-10-CM | POA: Insufficient documentation

## 2021-02-23 DIAGNOSIS — N186 End stage renal disease: Secondary | ICD-10-CM | POA: Insufficient documentation

## 2021-02-23 DIAGNOSIS — R000 Tachycardia, unspecified: Secondary | ICD-10-CM | POA: Insufficient documentation

## 2021-02-23 DIAGNOSIS — E1043 Type 1 diabetes mellitus with diabetic autonomic (poly)neuropathy: Principal | ICD-10-CM | POA: Diagnosis present

## 2021-02-23 DIAGNOSIS — Z8249 Family history of ischemic heart disease and other diseases of the circulatory system: Secondary | ICD-10-CM

## 2021-02-23 DIAGNOSIS — Z992 Dependence on renal dialysis: Secondary | ICD-10-CM

## 2021-02-23 DIAGNOSIS — E8729 Other acidosis: Secondary | ICD-10-CM | POA: Diagnosis present

## 2021-02-23 DIAGNOSIS — R739 Hyperglycemia, unspecified: Secondary | ICD-10-CM

## 2021-02-23 DIAGNOSIS — R112 Nausea with vomiting, unspecified: Secondary | ICD-10-CM

## 2021-02-23 DIAGNOSIS — I120 Hypertensive chronic kidney disease with stage 5 chronic kidney disease or end stage renal disease: Secondary | ICD-10-CM | POA: Diagnosis present

## 2021-02-23 DIAGNOSIS — E1036 Type 1 diabetes mellitus with diabetic cataract: Secondary | ICD-10-CM | POA: Diagnosis present

## 2021-02-23 DIAGNOSIS — Z20822 Contact with and (suspected) exposure to covid-19: Secondary | ICD-10-CM | POA: Diagnosis present

## 2021-02-23 DIAGNOSIS — D631 Anemia in chronic kidney disease: Secondary | ICD-10-CM | POA: Diagnosis present

## 2021-02-23 DIAGNOSIS — E10319 Type 1 diabetes mellitus with unspecified diabetic retinopathy without macular edema: Secondary | ICD-10-CM | POA: Diagnosis present

## 2021-02-23 HISTORY — DX: Dependence on renal dialysis: N18.6

## 2021-02-23 HISTORY — DX: End stage renal disease: Z99.2

## 2021-02-23 LAB — COMPREHENSIVE METABOLIC PANEL
ALT: 17 U/L (ref 0–44)
AST: 15 U/L (ref 15–41)
Albumin: 3.7 g/dL (ref 3.5–5.0)
Alkaline Phosphatase: 65 U/L (ref 38–126)
Anion gap: 20 — ABNORMAL HIGH (ref 5–15)
BUN: 64 mg/dL — ABNORMAL HIGH (ref 6–20)
CO2: 20 mmol/L — ABNORMAL LOW (ref 22–32)
Calcium: 10 mg/dL (ref 8.9–10.3)
Chloride: 95 mmol/L — ABNORMAL LOW (ref 98–111)
Creatinine, Ser: 10.47 mg/dL — ABNORMAL HIGH (ref 0.44–1.00)
GFR, Estimated: 5 mL/min — ABNORMAL LOW (ref >60)
Glucose, Bld: 376 mg/dL — ABNORMAL HIGH (ref 70–99)
Potassium: 4.2 mmol/L (ref 3.5–5.1)
Sodium: 135 mmol/L (ref 135–145)
Total Bilirubin: 0.4 mg/dL (ref 0.3–1.2)
Total Protein: 7.6 g/dL (ref 6.5–8.1)

## 2021-02-23 LAB — CBC WITH DIFFERENTIAL/PLATELET
Abs Immature Granulocytes: 0.01 10*3/uL (ref 0.00–0.07)
Basophils Absolute: 0 10*3/uL (ref 0.0–0.1)
Basophils Relative: 0 %
Eosinophils Absolute: 0 10*3/uL (ref 0.0–0.5)
Eosinophils Relative: 0 %
HCT: 37.3 % (ref 36.0–46.0)
Hemoglobin: 11.8 g/dL — ABNORMAL LOW (ref 12.0–15.0)
Immature Granulocytes: 0 %
Lymphocytes Relative: 10 %
Lymphs Abs: 0.7 10*3/uL (ref 0.7–4.0)
MCH: 27.4 pg (ref 26.0–34.0)
MCHC: 31.6 g/dL (ref 30.0–36.0)
MCV: 86.5 fL (ref 80.0–100.0)
Monocytes Absolute: 0.3 10*3/uL (ref 0.1–1.0)
Monocytes Relative: 4 %
Neutro Abs: 5.7 10*3/uL (ref 1.7–7.7)
Neutrophils Relative %: 86 %
Platelets: 441 10*3/uL — ABNORMAL HIGH (ref 150–400)
RBC: 4.31 MIL/uL (ref 3.87–5.11)
RDW: 12.9 % (ref 11.5–15.5)
WBC: 6.8 10*3/uL (ref 4.0–10.5)
nRBC: 0 % (ref 0.0–0.2)

## 2021-02-23 LAB — I-STAT VENOUS BLOOD GAS, ED
Acid-Base Excess: 1 mmol/L (ref 0.0–2.0)
Acid-Base Excess: 2 mmol/L (ref 0.0–2.0)
Bicarbonate: 22.8 mmol/L (ref 20.0–28.0)
Bicarbonate: 26.2 mmol/L (ref 20.0–28.0)
Calcium, Ion: 1.14 mmol/L — ABNORMAL LOW (ref 1.15–1.40)
Calcium, Ion: 1.12 mmol/L — ABNORMAL LOW (ref 1.15–1.40)
HCT: 37 % (ref 36.0–46.0)
HCT: 38 % (ref 36.0–46.0)
Hemoglobin: 12.6 g/dL (ref 12.0–15.0)
Hemoglobin: 12.9 g/dL (ref 12.0–15.0)
O2 Saturation: 95 %
O2 Saturation: 92 %
Potassium: 4.2 mmol/L (ref 3.5–5.1)
Potassium: 4 mmol/L (ref 3.5–5.1)
Sodium: 135 mmol/L (ref 135–145)
Sodium: 133 mmol/L — ABNORMAL LOW (ref 135–145)
TCO2: 24 mmol/L (ref 22–32)
TCO2: 27 mmol/L (ref 22–32)
pCO2, Ven: 28.3 mmHg — ABNORMAL LOW (ref 44.0–60.0)
pCO2, Ven: 37.4 mmHg — ABNORMAL LOW (ref 44.0–60.0)
pH, Ven: 7.514 — ABNORMAL HIGH (ref 7.250–7.430)
pH, Ven: 7.454 — ABNORMAL HIGH (ref 7.250–7.430)
pO2, Ven: 66 mmHg — ABNORMAL HIGH (ref 32.0–45.0)
pO2, Ven: 62 mmHg — ABNORMAL HIGH (ref 32.0–45.0)

## 2021-02-23 LAB — LIPASE, BLOOD: Lipase: 67 U/L — ABNORMAL HIGH (ref 11–51)

## 2021-02-23 LAB — CBG MONITORING, ED
Glucose-Capillary: 358 mg/dL — ABNORMAL HIGH (ref 70–99)
Glucose-Capillary: 231 mg/dL — ABNORMAL HIGH (ref 70–99)

## 2021-02-23 LAB — I-STAT BETA HCG BLOOD, ED (MC, WL, AP ONLY): I-stat hCG, quantitative: <5 m[IU]/mL (ref <5)

## 2021-02-23 LAB — BASIC METABOLIC PANEL
Anion gap: 18 — ABNORMAL HIGH (ref 5–15)
BUN: 65 mg/dL — ABNORMAL HIGH (ref 6–20)
CO2: 23 mmol/L (ref 22–32)
Calcium: 9.8 mg/dL (ref 8.9–10.3)
Chloride: 93 mmol/L — ABNORMAL LOW (ref 98–111)
Creatinine, Ser: 10.23 mg/dL — ABNORMAL HIGH (ref 0.44–1.00)
GFR, Estimated: 5 mL/min — ABNORMAL LOW (ref >60)
Glucose, Bld: 226 mg/dL — ABNORMAL HIGH (ref 70–99)
Potassium: 4.2 mmol/L (ref 3.5–5.1)
Sodium: 134 mmol/L — ABNORMAL LOW (ref 135–145)

## 2021-02-23 LAB — RESP PANEL BY RT-PCR (FLU A&B, COVID) ARPGX2
Influenza A by PCR: NEGATIVE
Influenza B by PCR: NEGATIVE
SARS Coronavirus 2 by RT PCR: NEGATIVE

## 2021-02-23 MED ORDER — METOCLOPRAMIDE HCL 5 MG/ML IJ SOLN
10.0000 mg | Freq: Once | INTRAMUSCULAR | Status: AC
  Administered 2021-02-23: 10 mg via INTRAVENOUS
  Filled 2021-02-23: qty 2

## 2021-02-23 MED ORDER — LACTATED RINGERS IV BOLUS
1000.0000 mL | Freq: Once | INTRAVENOUS | Status: AC
Start: 2021-02-23 — End: 2021-02-23
  Administered 2021-02-23: 1000 mL via INTRAVENOUS

## 2021-02-23 MED ORDER — INSULIN GLARGINE-YFGN 100 UNIT/ML ~~LOC~~ SOLN
10.0000 [IU] | Freq: Once | SUBCUTANEOUS | Status: AC
  Administered 2021-02-23: 10 [IU] via SUBCUTANEOUS
  Filled 2021-02-23: qty 0.1

## 2021-02-23 MED ORDER — SODIUM CHLORIDE 0.9 % IV SOLN: INTRAVENOUS | Status: AC

## 2021-02-23 MED ORDER — SODIUM CHLORIDE 0.9 % IV BOLUS
500.0000 mL | Freq: Once | INTRAVENOUS | Status: AC
  Administered 2021-02-23: 500 mL via INTRAVENOUS

## 2021-02-23 MED ORDER — METOCLOPRAMIDE HCL 5 MG PO TABS: 5.0000 mg | ORAL_TABLET | Freq: Three times a day (TID) | ORAL | 0 refills | Status: DC

## 2021-02-23 NOTE — Assessment & Plan Note (Signed)
EDP to notify nephrology about pt's PD. Pt states she is on a auto-cycler at home. Does 5 exchanges at night. Does not keep peritoneal dwell during the day.

## 2021-02-23 NOTE — ED Provider Triage Note (Addendum)
Emergency Medicine Provider Triage Evaluation Note  Barbara Donovan , a 31 y.o. female  was evaluated in triage.  Pt complains of nausea and vomiting.  She was just discharged for the same and while waiting for her ride felt nauseated and started vomiting.  No new symptoms    Physical Exam  BP (!) 175/115 (BP Location: Left Arm)    Pulse (!) 116    Temp 98.5 F (36.9 C) (Oral)    Resp 18    Ht 5\' 6"  (1.676 m)    Wt 64 kg    SpO2 99%    BMI 22.77 kg/m  Gen:   Awake, no distress   Resp:  Normal effort  MSK:   Moves extremities without difficulty  Other:  Normal speech.   Medical Decision Making  Medically screening exam initiated at 4:06 PM.  Appropriate orders placed.  KINETA FUDALA was informed that the remainder of the evaluation will be completed by another provider, this initial triage assessment does not replace that evaluation, and the importance of remaining in the ED until their evaluation is complete.  Patient had full labs this morning.  We will just recheck VBG and electrolytes.   Lorin Glass, PA-C 02/23/21 1609    Lorin Glass, [place] 02/23/21 1610

## 2021-02-23 NOTE — ED Provider Notes (Signed)
[facility] EMERGENCY DEPARTMENT Provider Note   CSN: [id number] Arrival date & time: 02/23/21  0846     History  Chief Complaint  Patient presents with   Nausea   Emesis    Barbara Donovan is a 31 y.o. female.  HPI 31 year old female presents with recurrent vomiting from gastroparesis.  The patient tells me that this has been an ongoing problem for her.  Started yesterday.  Numerous episodes of vomiting and some squeezing in her abdomen.  Feels similar to prior episodes.  No fever or diarrhea.  No dysuria.  She is on peritoneal dialysis and has not noticed any abnormal dialysate.  She also is a type I diabetic and has not been taking her insulin since yesterday and her glucose has gone up into the 400s.  She has tried Compazine and Phenergan without relief.  Home Medications Prior to Admission medications   Medication Sig Start Date End Date Taking? Authorizing Provider  acetaminophen (TYLENOL) 500 MG tablet Take 1,000 mg by mouth every 6 (six) hours as needed for mild pain.    [provider]  amLODipine (NORVASC) 10 MG tablet Take 1 tablet (10 mg total) by mouth daily. 03/09/20   Mercy Riding, MD  calcitRIOL (ROCALTROL) 0.5 MCG capsule Take 0.5 mcg by mouth daily. 12/02/20   [provider]  carvedilol (COREG) 25 MG tablet Take 1 tablet (25 mg total) by mouth 2 (two) times daily. 03/07/19   Imogene Burn, PA-C  clonazePAM (KLONOPIN) 0.5 MG tablet Take 1 tablet (0.5 mg total) by mouth 2 (two) times daily as needed for anxiety. 02/05/21   Bonnielee Haff, MD  Continuous Blood Gluc Sensor MISC 1 each by Does not apply route as directed. Use as directed every 14 days. May dispense FreeStyle [employer] or similar. 01/27/20   Antonieta Pert, MD  gentamicin cream (GARAMYCIN) 0.1 % Apply 1 application topically daily. 09/10/20   [provider]  glucose blood (ONETOUCH VERIO) test strip 1 each by Other route 3 (three) times daily. And lancets  3/day 11/28/20   Renato Shin, MD  heparin sodium, porcine, 1000 UNIT/ML injection Inject 3 mLs into the vein daily. 01/20/21   [provider]  hydrALAZINE (APRESOLINE) 50 MG tablet Take 1 tablet (50 mg total) by mouth 3 (three) times daily. 12/31/20   Domenic Polite, MD  insulin glargine (LANTUS SOLOSTAR) 100 UNIT/ML Solostar Pen Inject 20 Units into the skin every morning. 02/05/21   Bonnielee Haff, MD  insulin lispro (HUMALOG KWIKPEN) 100 UNIT/ML KwikPen Inject 3 Units into the skin 3 (three) times daily with meals. 11/28/20   Renato Shin, MD  losartan (COZAAR) 50 MG tablet Take 2 tablets (100 mg total) by mouth daily. Patient taking differently: Take 50 mg by mouth daily. 06/09/20   Geradine Girt, DO  metoCLOPramide (REGLAN) 5 MG tablet Take 1 tablet (5 mg total) by mouth 3 (three) times daily before meals. 02/23/21   Sherwood Gambler, MD  mirtazapine (REMERON) 7.5 MG tablet Take 1 tablet (7.5 mg total) by mouth at bedtime. 02/05/21   Bonnielee Haff, MD  multivitamin (RENA-VIT) TABS tablet Take 1 tablet by mouth at bedtime. 03/08/20   Mercy Riding, MD  ondansetron (ZOFRAN ODT) 4 MG disintegrating tablet Take 1 tablet (4 mg total) by mouth every 8 (eight) hours as needed for nausea or vomiting. 11/13/20   Thurnell Lose, MD  promethazine (PHENERGAN) 25 MG suppository Place 1 suppository (25 mg total) rectally  every 6 (six) hours as needed for nausea or vomiting. 02/19/21   Margarita Mail, PA-C  sevelamer carbonate (RENVELA) 800 MG tablet Take 1 tablet (800 mg total) by mouth 3 (three) times daily with meals. 02/05/21   Bonnielee Haff, MD      Allergies    Patient has no known allergies.    Review of Systems   Review of Systems  Constitutional:  Negative for fever.  Gastrointestinal:  Positive for abdominal pain, nausea and vomiting. Negative for diarrhea.  Genitourinary:  Negative for dysuria.   Physical Exam Updated Vital Signs BP (!) 202/108    Pulse 94    Temp 98.6 F (37  C) (Oral)    Resp 15    Ht 5\' 6"  (1.676 m)    Wt 63.5 kg    SpO2 98%    BMI 22.60 kg/m  Physical Exam Vitals and nursing note reviewed.  Constitutional:      Appearance: She is well-developed. She is not ill-appearing or diaphoretic.  HENT:     Head: Normocephalic and atraumatic.     Mouth/Throat:     Mouth: Mucous membranes are dry.  Cardiovascular:     Rate and Rhythm: Regular rhythm. Tachycardia present.     Heart sounds: Normal heart sounds.  Pulmonary:     Effort: Pulmonary effort is normal.     Breath sounds: Normal breath sounds.  Abdominal:     Palpations: Abdomen is soft.     Tenderness: There is abdominal tenderness in the right upper quadrant, epigastric area and left upper quadrant.  Skin:    General: Skin is warm and dry.  Neurological:     Mental Status: She is alert.    ED Results / Procedures / Treatments   Labs (all labs ordered are listed, but only abnormal results are displayed) Labs Reviewed  CBC WITH DIFFERENTIAL/PLATELET - Abnormal; Notable for the following components:      Result Value   Hemoglobin 11.8 (*)    Platelets 441 (*)    All other components within normal limits  COMPREHENSIVE METABOLIC PANEL - Abnormal; Notable for the following components:   Chloride 95 (*)    CO2 20 (*)    Glucose, Bld 376 (*)    BUN 64 (*)    Creatinine, Ser 10.47 (*)    GFR, Estimated 5 (*)    Anion gap 20 (*)    All other components within normal limits  LIPASE, BLOOD - Abnormal; Notable for the following components:   Lipase 67 (*)    All other components within normal limits  CBG MONITORING, ED - Abnormal; Notable for the following components:   Glucose-Capillary 358 (*)    All other components within normal limits  I-STAT VENOUS BLOOD GAS, ED - Abnormal; Notable for the following components:   pH, Ven 7.514 (*)    pCO2, Ven 28.3 (*)    pO2, Ven 66.0 (*)    Calcium, Ion 1.14 (*)    All other components within normal limits  URINALYSIS, ROUTINE W REFLEX  MICROSCOPIC  I-STAT BETA HCG BLOOD, ED ([facility], [facility], [facility] ONLY)    EKG EKG Interpretation  Date/Time:  Monday February 23 2021 09:34:51 EST Ventricular Rate:  110 PR Interval:  170 QRS Duration: 106 QT Interval:  354 QTC Calculation: 479 R Axis:   87 Text Interpretation: Sinus tachycardia ST elev, probable normal early repol pattern Borderline prolonged QT interval Baseline wander in lead(s) II V3 V5 Confirmed by Sherwood Gambler [phone]) on  02/23/2021 9:47:44 AM  Radiology No results found.  Procedures Procedures    Medications Ordered in ED Medications  lactated ringers bolus 1,000 mL (0 mLs Intravenous Stopped 02/23/21 1419)  metoCLOPramide (REGLAN) injection 10 mg (10 mg Intravenous Given 02/23/21 8638)    ED Course/ Medical Decision Making/ A&P                           Medical Decision Making Amount and/or Complexity of Data Reviewed Labs: ordered.  Risk Prescription drug management.   Patient presents with recurrent N/V consistent with prior gastroparesis. She is hyperglycemia. However her pH is alkalotic, and bicarb 20+. While she has an increased anion gap, I suspect this is more kidney disease than DKA. This is similar to her last admission and hospitalist came to same conclusion on that D/C summary from last month. Otherwise, after reglan she feels much better and wants to go home. She has tolerated water without difficulty. Will Rx reglan (unclear if she's taking this as was previously prescribed). ECG is unchanged from baseline on my view. While she has significant comorbidities, I don't think she needs admission and can be discharged.         Final Clinical Impression(s) / ED Diagnoses Final diagnoses:  Gastroparesis  Hyperglycemia    Rx / DC Orders ED Discharge Orders          Ordered    metoCLOPramide (REGLAN) 5 MG tablet  3 times daily before meals        02/23/21 1338              Sherwood Gambler, MD 02/23/21 1730

## 2021-02-23 NOTE — Discharge Instructions (Addendum)
If you develop worsening, continued, or recurrent abdominal pain, uncontrolled vomiting, fever, chest or back pain, or any other new/concerning symptoms then return to the ER for evaluation.  

## 2021-02-23 NOTE — ED Notes (Signed)
Pt states she  doesn't have her house keys, going to wait in lobby for ride.

## 2021-02-23 NOTE — Assessment & Plan Note (Signed)
Admit to observation med/surg bed. Likely due to diabetic gastroparesis and uncontrolled DM at home. However, will check UA to evaluate for UTI and UDS to r/o cannabis hyperemesis syndrome. Continue with scheduled Reglan 10 mg Q6h. Clear liquid diet.

## 2021-02-23 NOTE — Assessment & Plan Note (Signed)
Continue with IVf.
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
## 2021-02-23 NOTE — ED Triage Notes (Signed)
Pt presenting from home via EMS with c/o n/v. Reports she was recently seen here 2 days ago for same. Has been unable to take home medications due to vomiting. CBG 456 with EMS.

## 2021-02-23 NOTE — Subjective & Objective (Signed)
CC: N/V HPI: 31 year old African-American female with a history of type 1 diabetes since the age of 49, end-stage renal disease on peritoneal dialysis since May 2022, Diabetic gastroparesis, hypertension, frequent hospitalizations due to gastroparesis presents to the ER today for the second time due to nausea and vomiting.  Patient states that she last took her Reglan yesterday morning.  She has been having nausea and vomiting since yesterday afternoon.  She was seen in the ER earlier today.  She was treated with Phenergan and Reglan.  She was discharged home.  She came back to the ER only several hours later due to same complaints.  She has been volume resuscitated and also given IV Reglan.  Patient continues complain of nausea.  [facility] hospital[facility] contacted for admission.  Patient denies any alcohol use, patient still does urinate.  She does not have any dysuria.  She denies any marijuana use.

## 2021-02-23 NOTE — Assessment & Plan Note (Signed)
Stable

## 2021-02-23 NOTE — ED Triage Notes (Signed)
Pt arrives POV for eval of N/V. Dc'd from here less than an hour ago for same.

## 2021-02-23 NOTE — ED Provider Notes (Signed)
[facility] Provider Note   CSN: [id number] Arrival date & time: 02/23/21  1513     History  Chief Complaint  Patient presents with   Vomiting    Barbara Donovan is a 31 y.o. female presenting back to the Emergency Department persistent nausea and vomiting.  The patient of a discharged earlier today after being evaluated in ED and treated for suspected gastroparesis, improved with Reglan.  However awaiting the lobby for her ride she became nauseous and began vomiting again.  She therefore checked herself back in for another evaluation in the ER.  Medical records show that she has a history of gastroparesis, frequent ED visits and hospitalizations for the same condition.  Most recently hospitalized 3 weeks ago in January 18-19th.  She does perform evening peritoneal dialysis under self.  ECG this morning with QTC 473.  Takes 22 units lantus and 3 units shortacting TID with meals  HPI     Home Medications Prior to Admission medications   Medication Sig Start Date End Date Taking? Authorizing Provider  acetaminophen (TYLENOL) 500 MG tablet Take 1,000 mg by mouth every 6 (six) hours as needed for mild pain.   Yes [provider]  amLODipine (NORVASC) 10 MG tablet Take 1 tablet (10 mg total) by mouth daily. 03/09/20  Yes Mercy Riding, MD  calcitRIOL (ROCALTROL) 0.5 MCG capsule Take 0.5 mcg by mouth daily. 12/02/20  Yes [provider]  carvedilol (COREG) 25 MG tablet Take 1 tablet (25 mg total) by mouth 2 (two) times daily. 03/07/19  Yes Imogene Burn, PA-C  gentamicin cream (GARAMYCIN) 0.1 % Apply 1 application topically daily. 09/10/20  Yes [provider]  heparin sodium, porcine, 1000 UNIT/ML injection Inject 3 mLs into the vein daily. 01/20/21  Yes [provider]  hydrALAZINE (APRESOLINE) 50 MG tablet Take 1 tablet (50 mg total) by mouth 3 (three) times daily. 12/31/20  Yes Domenic Polite, MD  insulin  glargine (LANTUS SOLOSTAR) 100 UNIT/ML Solostar Pen Inject 20 Units into the skin every morning. 02/05/21  Yes Bonnielee Haff, MD  insulin lispro (HUMALOG KWIKPEN) 100 UNIT/ML KwikPen Inject 3 Units into the skin 3 (three) times daily with meals. 11/28/20  Yes Renato Shin, MD  losartan (COZAAR) 50 MG tablet Take 2 tablets (100 mg total) by mouth daily. Patient taking differently: Take 50 mg by mouth daily. 06/09/20  Yes Geradine Girt, DO  metoCLOPramide (REGLAN) 5 MG tablet Take 1 tablet (5 mg total) by mouth 3 (three) times daily before meals. 02/23/21  Yes Sherwood Gambler, MD  mirtazapine (REMERON) 7.5 MG tablet Take 1 tablet (7.5 mg total) by mouth at bedtime. 02/05/21  Yes Bonnielee Haff, MD  multivitamin (RENA-VIT) TABS tablet Take 1 tablet by mouth at bedtime. 03/08/20  Yes Mercy Riding, MD  promethazine (PHENERGAN) 25 MG suppository Place 1 suppository (25 mg total) rectally every 6 (six) hours as needed for nausea or vomiting. 02/19/21  Yes Harris, Abigail, PA-C  sevelamer carbonate (RENVELA) 800 MG tablet Take 1 tablet (800 mg total) by mouth 3 (three) times daily with meals. 02/05/21  Yes Bonnielee Haff, MD  clonazePAM (KLONOPIN) 0.5 MG tablet Take 1 tablet (0.5 mg total) by mouth 2 (two) times daily as needed for anxiety. Patient not taking: Reported on 02/23/2021 02/05/21   Bonnielee Haff, MD  Continuous Blood Gluc Sensor MISC 1 each by Does not apply route as directed. Use as directed every 14 days. May dispense FreeStyle [place]  Sensor System or similar. 01/27/20   Antonieta Pert, MD  glucose blood (ONETOUCH VERIO) test strip 1 each by Other route 3 (three) times daily. And lancets 3/day 11/28/20   Renato Shin, MD  ondansetron (ZOFRAN ODT) 4 MG disintegrating tablet Take 1 tablet (4 mg total) by mouth every 8 (eight) hours as needed for nausea or vomiting. Patient not taking: Reported on 02/23/2021 11/13/20   Thurnell Lose, MD      Allergies    Patient has no known allergies.    Review of  Systems   Review of Systems  Physical Exam Updated Vital Signs BP (!) 147/95    Pulse (!) 107    Temp 98.6 F (37 C) (Oral)    Resp 14    Ht 5\' 6"  (1.676 m)    Wt 64 kg    SpO2 98%    BMI 22.77 kg/m  Physical Exam Constitutional:      General: She is not in acute distress. HENT:     Head: Normocephalic and atraumatic.  Eyes:     Conjunctiva/sclera: Conjunctivae normal.     Pupils: Pupils are equal, round, and reactive to light.  Cardiovascular:     Rate and Rhythm: Normal rate and regular rhythm.     Pulses: Normal pulses.  Pulmonary:     Effort: Pulmonary effort is normal. No respiratory distress.  Abdominal:     General: There is no distension.     Tenderness: There is no abdominal tenderness.  Skin:    General: Skin is warm and dry.  Neurological:     General: No focal deficit present.     Mental Status: She is alert and oriented to person, place, and time. Mental status is at baseline.  Psychiatric:        Mood and Affect: Mood normal.        Behavior: Behavior normal.    ED Results / Procedures / Treatments   Labs (all labs ordered are listed, but only abnormal results are displayed) Labs Reviewed  BASIC METABOLIC PANEL - Abnormal; Notable for the following components:      Result Value   Sodium 134 (*)    Chloride 93 (*)    Glucose, Bld 226 (*)    BUN 65 (*)    Creatinine, Ser 10.23 (*)    GFR, Estimated 5 (*)    Anion gap 18 (*)    All other components within normal limits  I-STAT VENOUS BLOOD GAS, ED - Abnormal; Notable for the following components:   pH, Ven 7.454 (*)    pCO2, Ven 37.4 (*)    pO2, Ven 62.0 (*)    Sodium 133 (*)    Calcium, Ion 1.12 (*)    All other components within normal limits  CBG MONITORING, ED - Abnormal; Notable for the following components:   Glucose-Capillary 231 (*)    All other components within normal limits  RESP PANEL BY RT-PCR (FLU A&B, COVID) ARPGX2  URINALYSIS, COMPLETE (UACMP) WITH MICROSCOPIC  RAPID URINE DRUG  SCREEN, HOSP PERFORMED    EKG None  Radiology No results found.  Procedures Procedures    Medications Ordered in ED Medications  0.9 %  sodium chloride infusion ( Intravenous New Bag/Given 02/23/21 2328)  metoCLOPramide (REGLAN) injection 10 mg (10 mg Intravenous Given 02/23/21 2145)  sodium chloride 0.9 % bolus 500 mL (0 mLs Intravenous Stopped 02/23/21 2329)  insulin glargine-yfgn (SEMGLEE) injection 10 Units (10 Units Subcutaneous Given 02/23/21 2328)  ED Course/ Medical Decision Making/ A&P                           Medical Decision Making Risk Prescription drug management. Decision regarding hospitalization.   Patient is here with recurrent nausea and vomiting, likely related to gastroparesis.  We discussed now shows pH of 7.45, mildly alkalotic, with a normal bicarb.  She did have an anion gap of 20 this morning.  We are awaiting repeat BMP at this time, but there is some delay with blood test in the lab today.  I can give her a small bolus of fluids, will try Reglan again as it seems to benefit her the most, and will need medical admission at this time for failed outpatient therapy for gastroparesis.  I spoke to Dr Louie Boston nephrology who advised patient can have her dialysis tomorrow, no urgent indication, can treat nausea tonight.  IV reglan ordered.  BMP shows anion gap mildly improved 18 now, from 20 this morning.  Doubt DKA clinically.        Final Clinical Impression(s) / ED Diagnoses Final diagnoses:  Gastroparesis    Rx / DC Orders ED Discharge Orders     None         Arietta Eisenstein, Carola Rhine, MD 02/24/21 [phone]

## 2021-02-23 NOTE — Assessment & Plan Note (Signed)
Long hx of gastroparesis. Likely due to poorly controlled DM at home.
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
## 2021-02-23 NOTE — ED Notes (Signed)
Dr.Goldston notified of bp of 202/108. Cleared pt for discharge with education provided to take bp meds as prescribed. Pt states she has not taken bp meds x 2 days due to noncompliance. Will continue to monitor.

## 2021-02-24 ENCOUNTER — Encounter (HOSPITAL_COMMUNITY): Payer: Self-pay | Admitting: Internal Medicine

## 2021-02-24 DIAGNOSIS — R112 Nausea with vomiting, unspecified: Secondary | ICD-10-CM | POA: Diagnosis not present

## 2021-02-24 DIAGNOSIS — E1065 Type 1 diabetes mellitus with hyperglycemia: Secondary | ICD-10-CM | POA: Diagnosis present

## 2021-02-24 DIAGNOSIS — E1036 Type 1 diabetes mellitus with diabetic cataract: Secondary | ICD-10-CM | POA: Diagnosis present

## 2021-02-24 DIAGNOSIS — Z833 Family history of diabetes mellitus: Secondary | ICD-10-CM | POA: Diagnosis not present

## 2021-02-24 DIAGNOSIS — Z992 Dependence on renal dialysis: Secondary | ICD-10-CM | POA: Diagnosis not present

## 2021-02-24 DIAGNOSIS — E10319 Type 1 diabetes mellitus with unspecified diabetic retinopathy without macular edema: Secondary | ICD-10-CM | POA: Diagnosis present

## 2021-02-24 DIAGNOSIS — Z20822 Contact with and (suspected) exposure to covid-19: Secondary | ICD-10-CM | POA: Diagnosis present

## 2021-02-24 DIAGNOSIS — Z794 Long term (current) use of insulin: Secondary | ICD-10-CM | POA: Diagnosis not present

## 2021-02-24 DIAGNOSIS — Z79899 Other long term (current) drug therapy: Secondary | ICD-10-CM | POA: Diagnosis not present

## 2021-02-24 DIAGNOSIS — I120 Hypertensive chronic kidney disease with stage 5 chronic kidney disease or end stage renal disease: Secondary | ICD-10-CM | POA: Diagnosis present

## 2021-02-24 DIAGNOSIS — F419 Anxiety disorder, unspecified: Secondary | ICD-10-CM | POA: Diagnosis present

## 2021-02-24 DIAGNOSIS — E1043 Type 1 diabetes mellitus with diabetic autonomic (poly)neuropathy: Secondary | ICD-10-CM | POA: Diagnosis present

## 2021-02-24 DIAGNOSIS — E872 Acidosis, unspecified: Secondary | ICD-10-CM | POA: Diagnosis present

## 2021-02-24 DIAGNOSIS — D631 Anemia in chronic kidney disease: Secondary | ICD-10-CM | POA: Diagnosis present

## 2021-02-24 DIAGNOSIS — K3184 Gastroparesis: Secondary | ICD-10-CM | POA: Diagnosis present

## 2021-02-24 DIAGNOSIS — M898X9 Other specified disorders of bone, unspecified site: Secondary | ICD-10-CM | POA: Diagnosis present

## 2021-02-24 DIAGNOSIS — N186 End stage renal disease: Secondary | ICD-10-CM | POA: Diagnosis present

## 2021-02-24 DIAGNOSIS — E1022 Type 1 diabetes mellitus with diabetic chronic kidney disease: Secondary | ICD-10-CM | POA: Diagnosis present

## 2021-02-24 DIAGNOSIS — Z8249 Family history of ischemic heart disease and other diseases of the circulatory system: Secondary | ICD-10-CM | POA: Diagnosis not present

## 2021-02-24 LAB — COMPREHENSIVE METABOLIC PANEL
ALT: 12 U/L (ref 0–44)
AST: 12 U/L — ABNORMAL LOW (ref 15–41)
Albumin: 3.3 g/dL — ABNORMAL LOW (ref 3.5–5.0)
Alkaline Phosphatase: 54 U/L (ref 38–126)
Anion gap: 15 (ref 5–15)
BUN: 68 mg/dL — ABNORMAL HIGH (ref 6–20)
CO2: 22 mmol/L (ref 22–32)
Calcium: 8.9 mg/dL (ref 8.9–10.3)
Chloride: 99 mmol/L (ref 98–111)
Creatinine, Ser: 10.56 mg/dL — ABNORMAL HIGH (ref 0.44–1.00)
GFR, Estimated: 5 mL/min — ABNORMAL LOW (ref >60)
Glucose, Bld: 155 mg/dL — ABNORMAL HIGH (ref 70–99)
Potassium: 3.9 mmol/L (ref 3.5–5.1)
Sodium: 136 mmol/L (ref 135–145)
Total Bilirubin: 0.3 mg/dL (ref 0.3–1.2)
Total Protein: 6.6 g/dL (ref 6.5–8.1)

## 2021-02-24 LAB — CBG MONITORING, ED
Glucose-Capillary: 159 mg/dL — ABNORMAL HIGH (ref 70–99)
Glucose-Capillary: 177 mg/dL — ABNORMAL HIGH (ref 70–99)
Glucose-Capillary: 145 mg/dL — ABNORMAL HIGH (ref 70–99)

## 2021-02-24 LAB — CBC WITH DIFFERENTIAL/PLATELET
Abs Immature Granulocytes: 0.02 10*3/uL (ref 0.00–0.07)
Basophils Absolute: 0 10*3/uL (ref 0.0–0.1)
Basophils Relative: 0 %
Eosinophils Absolute: 0 10*3/uL (ref 0.0–0.5)
Eosinophils Relative: 0 %
HCT: 34.4 % — ABNORMAL LOW (ref 36.0–46.0)
Hemoglobin: 11.3 g/dL — ABNORMAL LOW (ref 12.0–15.0)
Immature Granulocytes: 0 %
Lymphocytes Relative: 21 %
Lymphs Abs: 2.1 10*3/uL (ref 0.7–4.0)
MCH: 28.6 pg (ref 26.0–34.0)
MCHC: 32.8 g/dL (ref 30.0–36.0)
MCV: 87.1 fL (ref 80.0–100.0)
Monocytes Absolute: 0.7 10*3/uL (ref 0.1–1.0)
Monocytes Relative: 7 %
Neutro Abs: 6.9 10*3/uL (ref 1.7–7.7)
Neutrophils Relative %: 72 %
Platelets: 415 10*3/uL — ABNORMAL HIGH (ref 150–400)
RBC: 3.95 MIL/uL (ref 3.87–5.11)
RDW: 13.1 % (ref 11.5–15.5)
WBC: 9.8 10*3/uL (ref 4.0–10.5)
nRBC: 0 % (ref 0.0–0.2)

## 2021-02-24 LAB — RAPID URINE DRUG SCREEN, HOSP PERFORMED
Amphetamines: NOT DETECTED — AB
Barbiturates: NOT DETECTED — AB
Benzodiazepines: NOT DETECTED — AB
Cocaine: NOT DETECTED — AB
Opiates: NOT DETECTED — AB
Tetrahydrocannabinol: NOT DETECTED — AB

## 2021-02-24 LAB — URINALYSIS, COMPLETE (UACMP) WITH MICROSCOPIC
Bilirubin Urine: NEGATIVE
Glucose, UA: >=500 mg/dL — AB
Ketones, ur: 5 mg/dL — AB
Leukocytes,Ua: NEGATIVE
Nitrite: NEGATIVE
Protein, ur: >=300 mg/dL — AB
Specific Gravity, Urine: 1.011 (ref 1.005–1.030)
pH: 7 (ref 5.0–8.0)

## 2021-02-24 LAB — GLUCOSE, CAPILLARY: Glucose-Capillary: 171 mg/dL — ABNORMAL HIGH (ref 70–99)

## 2021-02-24 LAB — MAGNESIUM: Magnesium: 2.2 mg/dL (ref 1.7–2.4)

## 2021-02-24 MED ORDER — INSULIN GLARGINE-YFGN 100 UNIT/ML ~~LOC~~ SOLN
20.0000 [IU] | Freq: Every day | SUBCUTANEOUS | Status: DC
  Administered 2021-02-24: 20 [IU] via SUBCUTANEOUS
  Filled 2021-02-24 (×3): qty 0.2

## 2021-02-24 MED ORDER — GENTAMICIN SULFATE 0.1 % EX CREA
1.0000 "application " | TOPICAL_CREAM | Freq: Every day | CUTANEOUS | Status: DC
  Administered 2021-02-24 – 2021-02-25 (×2): 1 via TOPICAL
  Filled 2021-02-24 (×2): qty 15

## 2021-02-24 MED ORDER — HYDRALAZINE HCL 50 MG PO TABS
50.0000 mg | ORAL_TABLET | Freq: Three times a day (TID) | ORAL | Status: DC
  Administered 2021-02-24 (×3): 50 mg via ORAL
  Filled 2021-02-24: qty 2
  Filled 2021-02-24: qty 1
  Filled 2021-02-24: qty 2
  Filled 2021-02-24: qty 1

## 2021-02-24 MED ORDER — ACETAMINOPHEN 325 MG PO TABS: 650.0000 mg | ORAL_TABLET | Freq: Four times a day (QID) | ORAL | Status: DC | PRN

## 2021-02-24 MED ORDER — CARVEDILOL 25 MG PO TABS
25.0000 mg | ORAL_TABLET | Freq: Two times a day (BID) | ORAL | Status: DC
  Administered 2021-02-24 – 2021-02-25 (×3): 25 mg via ORAL
  Filled 2021-02-24: qty 2
  Filled 2021-02-24 (×2): qty 1

## 2021-02-24 MED ORDER — INSULIN ASPART 100 UNIT/ML IJ SOLN
0.0000 [IU] | Freq: Three times a day (TID) | INTRAMUSCULAR | Status: DC
  Administered 2021-02-24: 1 [IU] via SUBCUTANEOUS
  Administered 2021-02-24 – 2021-02-25 (×2): 2 [IU] via SUBCUTANEOUS

## 2021-02-24 MED ORDER — INSULIN ASPART 100 UNIT/ML IJ SOLN: 0.0000 [IU] | Freq: Every day | INTRAMUSCULAR | Status: DC

## 2021-02-24 MED ORDER — METOCLOPRAMIDE HCL 5 MG/ML IJ SOLN
10.0000 mg | Freq: Four times a day (QID) | INTRAMUSCULAR | Status: DC
  Administered 2021-02-24 – 2021-02-25 (×5): 10 mg via INTRAVENOUS
  Filled 2021-02-24 (×5): qty 2

## 2021-02-24 MED ORDER — DELFLEX-LC/1.5% DEXTROSE 344 MOSM/L IP SOLN: INTRAPERITONEAL | Status: DC

## 2021-02-24 MED ORDER — MIRTAZAPINE 15 MG PO TABS
7.5000 mg | ORAL_TABLET | Freq: Every day | ORAL | Status: DC
Start: 2021-02-24 — End: 2021-02-25
  Administered 2021-02-24: 7.5 mg via ORAL
  Filled 2021-02-24 (×2): qty 1

## 2021-02-24 MED ORDER — AMLODIPINE BESYLATE 10 MG PO TABS
10.0000 mg | ORAL_TABLET | Freq: Every day | ORAL | Status: DC
  Administered 2021-02-24: 10 mg via ORAL
  Filled 2021-02-24: qty 1
  Filled 2021-02-24: qty 2

## 2021-02-24 MED ORDER — ACETAMINOPHEN 650 MG RE SUPP: 650.0000 mg | Freq: Four times a day (QID) | RECTAL | Status: DC | PRN

## 2021-02-24 MED ORDER — HEPARIN SODIUM (PORCINE) 5000 UNIT/ML IJ SOLN
5000.0000 [IU] | Freq: Three times a day (TID) | INTRAMUSCULAR | Status: DC
  Administered 2021-02-24 – 2021-02-25 (×4): 5000 [IU] via SUBCUTANEOUS
  Filled 2021-02-24 (×4): qty 1

## 2021-02-24 MED ORDER — CALCITRIOL 0.5 MCG PO CAPS
0.5000 ug | ORAL_CAPSULE | Freq: Every day | ORAL | Status: DC
  Administered 2021-02-24 – 2021-02-25 (×2): 0.5 ug via ORAL
  Filled 2021-02-24 (×2): qty 1

## 2021-02-24 MED ORDER — LOSARTAN POTASSIUM 50 MG PO TABS
50.0000 mg | ORAL_TABLET | Freq: Every day | ORAL | Status: DC
  Administered 2021-02-24: 50 mg via ORAL
  Filled 2021-02-24 (×2): qty 1

## 2021-02-24 MED ORDER — SEVELAMER CARBONATE 800 MG PO TABS
800.0000 mg | ORAL_TABLET | Freq: Three times a day (TID) | ORAL | Status: DC
  Administered 2021-02-24 – 2021-02-25 (×4): 800 mg via ORAL
  Filled 2021-02-24 (×4): qty 1

## 2021-02-24 MED ORDER — PROCHLORPERAZINE EDISYLATE 10 MG/2ML IJ SOLN
10.0000 mg | Freq: Four times a day (QID) | INTRAMUSCULAR | Status: DC | PRN
  Administered 2021-02-24: 10 mg via INTRAVENOUS

## 2021-02-24 NOTE — Progress Notes (Signed)
Inpatient Diabetes Program Recommendations  AACE/ADA: New Consensus Statement on Inpatient Glycemic Control (2015)  Target Ranges:  Prepandial:   less than 140 mg/dL      Peak postprandial:   less than 180 mg/dL (1-2 hours)      Critically ill patients:  140 - 180 mg/dL   Lab Results  Component Value Date   GLUCAP 177 (H) 02/24/2021   HGBA1C 6.9 (A) 01/21/2021    Review of Glycemic Control  Diabetes history: DM type 1 Outpatient Diabetes medications: Lantus 20 units, Humalog 3 units tid meal coverage Current orders for Inpatient glycemic control:  Semglee 20 units Novolog 0-9 units tid + hs  A1c 6.9% on 01/21/2021  Note consult for Outpatient education Order placed for ambulatory referral  Went to see pt at bedside to see what questions she may have. Pt was wondering what her insulin regimen should look like if she was unable to eat. Discussed sick day guidelines with pt and encouraged her to form a plan with her PCP regarding this. Attached sick day guidelines to AVS for some guidance.    Thanks,  Tama Headings RN, MSN, BC-ADM Inpatient Diabetes Coordinator Team Pager [phone] (8a-5p)

## 2021-02-24 NOTE — ED Notes (Signed)
Pt ate some regular food and started dry heaving. MD notified. Compazine IVP given at this time. Will continue to monitor.

## 2021-02-24 NOTE — ED Notes (Signed)
Breakfast Orders Placed °

## 2021-02-24 NOTE — Progress Notes (Signed)
Called Hemodialysis to ff/up pt's Peritoneal dialysis order.

## 2021-02-24 NOTE — Social Work (Signed)
CSW met with Pt at bedside. Per Pt she is receiving peritoneal dialysis nightly. She is also providing care to her 31 y/o special needs son while her SO works 12 hour shifts. CSW consulted for MH needs. Pt scored 10 on PHQ-9. Pt is agreeable to participating in [facility] therapy.  CSW provided printed resources for OP therapy and also discussed strategies for  finding the right fit for a therapist. Pt reports that she has been taking Mirtazapine as prescribed for the past 2 weeks. RNCM coordinated PCP care for Pt follow up.

## 2021-02-24 NOTE — Progress Notes (Signed)
Unable to perform Peritoneal Dialysis d/t machine unavailable to assign to pt.

## 2021-02-24 NOTE — Progress Notes (Signed)
°   02/24/21 0944  TOC ED Mini Assessment  TOC Time spent with patient (minutes): 45  PING Used in TOC Assessment No  Admission or Readmission Diverted No  What brought you to the Emergency Department?  nausea and vomiting  Barriers to Discharge Continued Medical Work up  [employer] interventions PCP establishment appointment; diabetic coordinator consult  Spoke with Imelda Pillow Date 02/24/21  Contact time 0928  Contact Phone Number [phone]  Call outcome discussed dipsoition needs including PCP establishment, additional diabetic education and psych evaluation  Patient states their goals for this hospitalization and ongoing recovery are: feel better   RNCM spoke with Helene Kelp, Case Manager with [facility] [phone]) regarding disposition needs.  TOC team will continue to follow and update team until discharge.

## 2021-02-24 NOTE — ED Notes (Signed)
Pt is currently on full liquid diet. Pt is requesting to eat regular diet. Admit MD notified. Waiting for new orders.

## 2021-02-24 NOTE — Progress Notes (Signed)
PROGRESS NOTE  Barbara Donovan  DOB: 01/09/1991  PCP: No primary care provider on file. MBT:[id number]  DOA: 02/23/2021  LOS: 0 days  Hospital Day: 2  Chief Complaint  Patient presents with   Vomiting   Brief narrative: Barbara Donovan is a 31 y.o. female with PMH significant for type 1 diabetes mellitus since the age of 3, ESRD on peritoneal dialysis since May 2022, diabetic gastroparesis, retinopathy, hypertension, frequent hospitalization because of chronic nausea vomiting. Patient presented to the ED on 02/23/2021 for flareup of gastroparesis with persistent nausea, vomiting.  In the ED, she was tachycardic to 116, blood pressure elevated up to 190s. Lab with sodium 134, glucose elevated at 226, anion gap 18 Urinalysis with hazy yellow urine, small hemoglobin, monitor and protein, rare bacteria. urine drug screen negative  Patient was started on IV fluid, IV antibiotics. Keep in observation and hospitalist service.  Subjective: Patient was seen and examined this morning.  Pleasant young African-American female.  Sitting up in bed.  Nausea improving.  Has not thrown up since last night. Currently on clear liquid diet.  Wants to advance   Assessment/Plan: Intractable nausea and vomiting Acute flareup of diabetic gastroparesis -Patient states he has almost 1 day episode of acute flareup of gastroparesis.  Recently her son was sick which stressed her out and she thinks it precipitated her current episode.  At home, she is on Reglan 3 times daily as well as as needed Phenergan and Compazine. -Currently on IV Reglan 10 mg every 6 scheduled. -States she has not vomited since last night.  Will advance to full liquid diet from tonight. -Currently on NS at 50 mils per hour  ESRD (end stage renal disease) on PD -Nephrology consulted.    Type 1 diabetes mellitus with hyperglycemia -A1c 6.9 from January 2023 -Home meds include Lantus 20 units nightly and NovoLog 3 units  pre-meal -She received Lantus 10 units only last night.  Resume home dose of 20 units from tonight.  Also start on sliding scale insulin. Recent Labs  Lab 02/23/21 0912 02/23/21 1952 02/24/21 0803  GLUCAP 358* 231* 159*   Uncontrolled hypertension  -Home meds include Coreg 25 mg twice daily, amlodipine 10 mg daily, losartan 50 mg daily. -Blood pressure was elevated up to 190s on presentation probably because of the stress of nausea vomiting.  Currently continued on all 3 medicines with blood pressure in range at 128/81 this morning.  Mobility: Encourage ambulation Goals of care   Code Status: Full Code   Nutritional status:  Body mass index is 22.77 kg/m.      Diet:  Diet Order             Diet full liquid Room service appropriate? Yes; Fluid consistency: Thin  Diet effective 1400           Diet clear liquid Room service appropriate? Yes; Fluid consistency: Thin  Diet effective now                  DVT prophylaxis:  heparin injection 5,000 Units Start: 02/24/21 0600 SCDs Start: 02/24/21 0558   Antimicrobials: None Fluid: NS at 50 mill per hour Consultants: Nephrology Family Communication: None at bedside  Status is: Observation  Continue in-hospital care because: Needs IV fluid, blood glucose monitoring, antiemetics IV Level of care: Med-Surg   Dispo: The patient is from: Home              Anticipated d/c is to: Hopefully home in 1  to 2 days              Patient currently is not medically stable to d/c.   Difficult to place patient No     Infusions:   sodium chloride 50 mL/hr at 02/23/21 2328    Scheduled Meds:  amLODipine  10 mg Oral Daily   calcitRIOL  0.5 mcg Oral Daily   carvedilol  25 mg Oral BID   heparin  5,000 Units Subcutaneous Q8H   hydrALAZINE  50 mg Oral TID   insulin aspart  0-5 Units Subcutaneous QHS   insulin aspart  0-9 Units Subcutaneous TID WC   insulin glargine-yfgn  20 Units Subcutaneous QHS   losartan  50 mg Oral Daily    metoCLOPramide (REGLAN) injection  10 mg Intravenous Q6H   mirtazapine  7.5 mg Oral QHS   sevelamer carbonate  800 mg Oral TID WC    PRN meds: acetaminophen **OR** acetaminophen   Antimicrobials: Anti-infectives (From admission, onward)    None       Objective: Vitals:   02/24/21 0900 02/24/21 1000  BP: (!) 153/105 128/81  Pulse: 92 82  Resp: 12 12  Temp:    SpO2: 100% 99%   No intake or output data in the 24 hours ending 02/24/21 1035 Filed Weights   02/23/21 1549  Weight: 64 kg   Weight change:  Body mass index is 22.77 kg/m.   Physical Exam: General exam: Pleasant, young African-American female.  Not in physical distress Skin: No rashes, lesions or ulcers. HEENT: Atraumatic, normocephalic, no obvious bleeding Lungs: Clear to auscultation bilaterally CVS: Regular rate and rhythm, no murmur GI/Abd soft, mild epigastric tenderness, bowel sound present CNS: Alert, awake, oriented x3 Psychiatry: Sad affect Extremities: No pedal edema, no calf tenderness  Data Review: I have personally reviewed the laboratory data and studies available.  F/u labs ordered Unresulted Labs (From admission, onward)     Start     Ordered   Unscheduled  CBC with Differential/Platelet  Tomorrow morning,   R        02/24/21 1035   Unscheduled  Basic metabolic panel  Tomorrow morning,   R        02/24/21 1035            Signed, Terrilee Croak, MD [facility] 02/24/2021

## 2021-02-24 NOTE — Consult Note (Signed)
Renal Service Consult Note [facility]  Barbara Donovan 02/24/2021 Sol Blazing, MD Requesting Physician: Dr. Pietro Cassis  Reason for Consult: ESRD pt w/ N/V, gastroparesis HPI: The patient is a 31 y.o. year-old w/ hx of DM type 1, gastroparesis, HTN, ESRD on PD, HTN who presented to ED 2/06 w/ recurrent N/V for about 2 days. Unable to keep medications down. In ED BS was 456. She was nauseous and rec'd IV reglan. Pt was admitted. Asked to see for ESRD.    Pt started on HD in May 2022, then switched to PD in Sept 2022. No AVF, TDC removed. Normal wt is about 138 lbs, no recent changes to her wt. No tob/ etoh, lives w/ her boyfriend and her son. When not having n/v episode, her appetite and energy is good. No recent PD cath or fluid issues.   ROS - denies CP, no joint pain, no HA, no blurry vision, no rash, no diarrhea, no nausea/ vomiting, no dysuria, no difficulty voiding   Past Medical History  Past Medical History:  Diagnosis Date   Asthma    as a child, no problems as an adult, no inhaler   Cataract    NS OU   Chronic hypertension during pregnancy, antepartum 08/19/2017   Dehydration 01/28/2018   Depression during pregnancy, antepartum 07/07/2017   6/20: Short trial of zoloft previously, reports didn't help much but also didn't give it a chance Discussed r/b/a SSRIs in pregnancy, agrees to try Zoloft again, rx sent No SI/HI/red flags   Diabetes ([place])    TYPE I. A1C 7.5% 05/31/20   Diabetic retinopathy ([place]) 06/09/2017   07/2017 with bilateral severe diabetic non-proliferative retinopathy with macular edema.   ESRD (end stage renal disease) on dialysis ([place])    4 x week  M T TH F   HTN (hypertension)    Hypertensive retinopathy    OU   Hypokalemia 01/22/2018   Hypomagnesemia 01/28/2018   Intractable nausea and vomiting 01/22/2018   Intrauterine growth restriction (IUGR) affecting care of mother 12/22/2017   Morbid obesity ([place])    Nephropathy, diabetic ([place])  12/29/2017   Peritoneal dialysis status ([place])    Proteinuria due to type 1 diabetes mellitus ([place]) 11/02/2017   Baseline Pr: Cr 10.23   Severe hyperemesis gravidarum 10/30/2017   Type I diabetes mellitus ([place]) 07/07/2017   Current Diabetic Medications:  Insulin  [x]  Aspirin 81 mg daily after 12 weeks (? A2/B GDM)  Required Referrals for A1GDM or A2GDM: [x]  Diabetes Education and Testing Supplies [x]  Nutrition Cousult  For A2/B GDM or higher classes of DM [x]  Diabetes Education and Testing Supplies [x]  Nutrition Counsult [x]  Fetal ECHO after 20 weeks  [x]  Eye exam for retina evaluation - severe retinopathy 7/19  Base   Ventricular septal defect (VSD) of fetus in singleton pregnancy, antepartum 09/30/2017   May go to newborn nursery per Dr. Lenard Simmer Echo prior to discharge   Past Surgical History  Past Surgical History:  Procedure Laterality Date   25 GAUGE PARS PLANA VITRECTOMY WITH 20 GAUGE MVR PORT FOR MACULAR HOLE Left 07/20/2018   Procedure: 25 GAUGE PARS PLANA VITRECTOMY LEFT EYE;  Surgeon: Bernarda Caffey, MD;  Location: [place];  Service: Ophthalmology;  Laterality: Left;   CAPD INSERTION N/A 09/10/2020   Procedure: LAPAROSCOPIC INSERTION CONTINUOUS AMBULATORY PERITONEAL DIALYSIS  (CAPD) CATHETER;  Surgeon: Serafina Mitchell, MD;  Location: [place];  Service: Vascular;  Laterality: N/A;   CESAREAN SECTION N/A 12/26/2017   Procedure:  CESAREAN SECTION;  Surgeon: Osborne Oman, MD;  Location: [place];  Service: Obstetrics;  Laterality: N/A;   EYE EXAMINATION UNDER ANESTHESIA Right 07/20/2018   Procedure: Eye Exam Under Anesthesia RIGHT EYE;  Surgeon: Bernarda Caffey, MD;  Location: [place];  Service: Ophthalmology;  Laterality: Right;   EYE SURGERY Left 07/2018   GAS INSERTION Left 07/19/2019   Procedure: INSERTION OF GAS;  Surgeon: Bernarda Caffey, MD;  Location: [place];  Service: Ophthalmology;  Laterality: Left;  SF6   INJECTION OF SILICONE OIL Left 02/22/2351   Procedure: Injection Of Silicone Oil  LEFT EYE;  Surgeon: Bernarda Caffey, MD;  Location: [place];  Service: Ophthalmology;  Laterality: Left;   IR FLUORO GUIDE CV LINE RIGHT  06/02/2020   IR US GUIDE VASC ACCESS RIGHT  06/02/2020   LASER PHOTO ABLATION Right 07/20/2018   Procedure: Laser Photo Ablation RIGHT EYE;  Surgeon: Bernarda Caffey, MD;  Location: [place];  Service: Ophthalmology;  Laterality: Right;   MEMBRANE PEEL Left 07/20/2018   Procedure: Membrane Peel LEFT EYE;  Surgeon: Bernarda Caffey, MD;  Location: [place];  Service: Ophthalmology;  Laterality: Left;   MEMBRANE PEEL Left 07/19/2019   Procedure: MEMBRANE PEEL;  Surgeon: Bernarda Caffey, MD;  Location: [place];  Service: Ophthalmology;  Laterality: Left;   MITOMYCIN C APPLICATION Bilateral 06/18/4429   Procedure: Avastin Application;  Surgeon: Bernarda Caffey, MD;  Location: [place];  Service: Ophthalmology;  Laterality: Bilateral;   PHOTOCOAGULATION WITH LASER Left 07/20/2018   Procedure: Photocoagulation With Laser LEFT EYE;  Surgeon: Bernarda Caffey, MD;  Location: [place];  Service: Ophthalmology;  Laterality: Left;   PHOTOCOAGULATION WITH LASER Left 07/19/2019   Procedure: PHOTOCOAGULATION WITH LASER;  Surgeon: Bernarda Caffey, MD;  Location: [place];  Service: Ophthalmology;  Laterality: Left;   RETINAL DETACHMENT SURGERY Left 07/20/2018   Dr. Bernarda Caffey   SILICON OIL REMOVAL Left 07/19/2019   Procedure: 25g PARS PLANA VITRECTOMY WITH SILICON OIL REMOVAL;  Surgeon: Bernarda Caffey, MD;  Location: [place];  Service: Ophthalmology;  Laterality: Left;   WISDOM TOOTH EXTRACTION     Family History  Family History  Problem Relation Age of Onset   Diabetes Mother    Aneurysm Mother 41   Seizures Mother    Diabetes Father    Cataracts Father    COPD Father    Heart attack Father    Heart disease Father    Healthy Sister    Healthy Daughter    Stroke Maternal Grandfather    Amblyopia Neg Hx    Blindness Neg Hx    Glaucoma Neg Hx    Macular degeneration Neg Hx    Retinal detachment Neg Hx     Strabismus Neg Hx    Retinitis pigmentosa Neg Hx    Colon cancer Neg Hx    Stomach cancer Neg Hx    Esophageal cancer Neg Hx    Pancreatic cancer Neg Hx    Liver disease Neg Hx    Social History  reports that she has never smoked. She has never used smokeless tobacco. She reports that she does not currently use alcohol. She reports that she does not use drugs. Allergies No Known Allergies Home medications Prior to Admission medications   Medication Sig Start Date End Date Taking? Authorizing Provider  acetaminophen (TYLENOL) 500 MG tablet Take 1,000 mg by mouth every 6 (six) hours as needed for mild pain.   Yes [provider]  amLODipine (NORVASC) 10 MG tablet Take  1 tablet (10 mg total) by mouth daily. 03/09/20  Yes Mercy Riding, MD  calcitRIOL (ROCALTROL) 0.5 MCG capsule Take 0.5 mcg by mouth daily. 12/02/20  Yes [provider]  carvedilol (COREG) 25 MG tablet Take 1 tablet (25 mg total) by mouth 2 (two) times daily. 03/07/19  Yes Imogene Burn, PA-C  gentamicin cream (GARAMYCIN) 0.1 % Apply 1 application topically daily. 09/10/20  Yes [provider]  heparin sodium, porcine, 1000 UNIT/ML injection Inject 3 mLs into the vein daily. 01/20/21  Yes [provider]  hydrALAZINE (APRESOLINE) 50 MG tablet Take 1 tablet (50 mg total) by mouth 3 (three) times daily. 12/31/20  Yes Domenic Polite, MD  insulin glargine (LANTUS SOLOSTAR) 100 UNIT/ML Solostar Pen Inject 20 Units into the skin every morning. 02/05/21  Yes Bonnielee Haff, MD  insulin lispro (HUMALOG KWIKPEN) 100 UNIT/ML KwikPen Inject 3 Units into the skin 3 (three) times daily with meals. 11/28/20  Yes Renato Shin, MD  losartan (COZAAR) 50 MG tablet Take 2 tablets (100 mg total) by mouth daily. Patient taking differently: Take 50 mg by mouth daily. 06/09/20  Yes Geradine Girt, DO  metoCLOPramide (REGLAN) 5 MG tablet Take 1 tablet (5 mg total) by mouth 3 (three) times daily before meals. 02/23/21   Yes Sherwood Gambler, MD  mirtazapine (REMERON) 7.5 MG tablet Take 1 tablet (7.5 mg total) by mouth at bedtime. 02/05/21  Yes Bonnielee Haff, MD  multivitamin (RENA-VIT) TABS tablet Take 1 tablet by mouth at bedtime. 03/08/20  Yes Mercy Riding, MD  promethazine (PHENERGAN) 25 MG suppository Place 1 suppository (25 mg total) rectally every 6 (six) hours as needed for nausea or vomiting. 02/19/21  Yes Harris, Abigail, PA-C  sevelamer carbonate (RENVELA) 800 MG tablet Take 1 tablet (800 mg total) by mouth 3 (three) times daily with meals. 02/05/21  Yes Bonnielee Haff, MD  clonazePAM (KLONOPIN) 0.5 MG tablet Take 1 tablet (0.5 mg total) by mouth 2 (two) times daily as needed for anxiety. Patient not taking: Reported on 02/23/2021 02/05/21   Bonnielee Haff, MD  Continuous Blood Gluc Sensor MISC 1 each by Does not apply route as directed. Use as directed every 14 days. May dispense FreeStyle [employer] or similar. 01/27/20   Antonieta Pert, MD  glucose blood (ONETOUCH VERIO) test strip 1 each by Other route 3 (three) times daily. And lancets 3/day 11/28/20   Renato Shin, MD  ondansetron (ZOFRAN ODT) 4 MG disintegrating tablet Take 1 tablet (4 mg total) by mouth every 8 (eight) hours as needed for nausea or vomiting. Patient not taking: Reported on 02/23/2021 11/13/20   Thurnell Lose, MD     Vitals:   02/24/21 1000 02/24/21 1100 02/24/21 1200 02/24/21 1300  BP: 128/81 (!) 161/100 137/88 132/83  Pulse: 82 90 89 87  Resp: 12 15 14 11   Temp:      TempSrc:      SpO2: 99% 99% 98% 99%  Weight:      Height:       Exam Gen alert, no distress No rash, cyanosis or gangrene Sclera anicteric, throat clear  No jvd or bruits Chest clear bilat to bases, no rales/ wheezing RRR no RG Abd soft ntnd no mass or ascites +bs GU deferred MS no joint effusions or deformity Ext no LE or UE edema, no wounds or ulcers Neuro is alert, Ox 3 , nf  LLQ PD cath intact/ dressed    Home meds include - norvasc 10,  coreg 25 bid, hydralazine 50 tid, insulin lantus/ lispro, losartan 50 qd, reglan tid, remeron, rena-vit, renvela 800 tid, klonopin 0.5 bid prn, prns    OP PD:  5 exchanges, fill vol 2000 ml, 63kg , 1.5 hr dwell time, no day bag     Assessment/ Plan: N/V - suspected gastroparesis, per pmd ESRD - on PD.  Will plan PD tonight BP/volume - BP's wnl, no vol ^ on exam, looks euvolemic. Will use all 1.5% bags and get a good weight when possible.  DM1 - per pmd Anemia ckd - Hb 11, no esa needs MBD ckd - cont binder ac, get records      Rob Nijah Orlich  MD 02/24/2021, 1:50 PM  Recent Labs  Lab 02/23/21 0930 02/23/21 0947 02/23/21 1625 02/24/21 0634  WBC 6.8  --   --  9.8  HGB 11.8*   < > 12.9 11.3*   < > = values in this interval not displayed.   Recent Labs  Lab 02/23/21 0930 02/23/21 0947 02/23/21 1613 02/23/21 1625 02/24/21 0634  K 4.2   < > 4.2 4.0 3.9  BUN 64*  --  65*  --  68*  CREATININE 10.47*  --  10.23*  --  10.56*  ALBUMIN 3.7  --   --   --  3.3*  CALCIUM 10.0  --  9.8  --  8.9   < > = values in this interval not displayed.

## 2021-02-25 DIAGNOSIS — R112 Nausea with vomiting, unspecified: Secondary | ICD-10-CM | POA: Diagnosis not present

## 2021-02-25 LAB — BASIC METABOLIC PANEL
Anion gap: 15 (ref 5–15)
BUN: 74 mg/dL — ABNORMAL HIGH (ref 6–20)
CO2: 19 mmol/L — ABNORMAL LOW (ref 22–32)
Calcium: 9.3 mg/dL (ref 8.9–10.3)
Chloride: 98 mmol/L (ref 98–111)
Creatinine, Ser: 10.01 mg/dL — ABNORMAL HIGH (ref 0.44–1.00)
GFR, Estimated: 5 mL/min — ABNORMAL LOW (ref >60)
Glucose, Bld: 154 mg/dL — ABNORMAL HIGH (ref 70–99)
Potassium: 4 mmol/L (ref 3.5–5.1)
Sodium: 132 mmol/L — ABNORMAL LOW (ref 135–145)

## 2021-02-25 LAB — CBC WITH DIFFERENTIAL/PLATELET
Abs Immature Granulocytes: 0.04 10*3/uL (ref 0.00–0.07)
Basophils Absolute: 0 10*3/uL (ref 0.0–0.1)
Basophils Relative: 0 %
Eosinophils Absolute: 0.1 10*3/uL (ref 0.0–0.5)
Eosinophils Relative: 1 %
HCT: 35.1 % — ABNORMAL LOW (ref 36.0–46.0)
Hemoglobin: 11.3 g/dL — ABNORMAL LOW (ref 12.0–15.0)
Immature Granulocytes: 1 %
Lymphocytes Relative: 5 %
Lymphs Abs: 0.5 10*3/uL — ABNORMAL LOW (ref 0.7–4.0)
MCH: 27.4 pg (ref 26.0–34.0)
MCHC: 32.2 g/dL (ref 30.0–36.0)
MCV: 85.2 fL (ref 80.0–100.0)
Monocytes Absolute: 0.4 10*3/uL (ref 0.1–1.0)
Monocytes Relative: 5 %
Neutro Abs: 7.6 10*3/uL (ref 1.7–7.7)
Neutrophils Relative %: 88 %
Platelets: 389 10*3/uL (ref 150–400)
RBC: 4.12 MIL/uL (ref 3.87–5.11)
RDW: 12.9 % (ref 11.5–15.5)
WBC: 8.6 10*3/uL (ref 4.0–10.5)
nRBC: 0 % (ref 0.0–0.2)

## 2021-02-25 LAB — GLUCOSE, CAPILLARY
Glucose-Capillary: 153 mg/dL — ABNORMAL HIGH (ref 70–99)
Glucose-Capillary: 108 mg/dL — ABNORMAL HIGH (ref 70–99)

## 2021-02-25 MED ORDER — FREESTYLE TEST VI STRP: ORAL_STRIP | 0 refills | Status: DC

## 2021-02-25 NOTE — Discharge Summary (Signed)
Physician Discharge Summary  Barbara Donovan CBJ:[id number] DOB: Jul 21, 1990 DOA: 02/23/2021  PCP: No primary care provider on file.  Admit date: 02/23/2021 Discharge date: 02/25/2021  Admitted From: Home Discharge disposition: Home  Recommendations at discharge:  Continue to comply with your insulin regimen and dietary discipline. Continue to follow-up with endocrinology, nephrology Continue Reglan and as needed antiemetic  Discharge Diagnosis:   Principal Problem:   Intractable nausea and vomiting Active Problems:   Chronic hypertension   Diabetic gastroparesis (HCC)   Type 1 diabetes mellitus with hyperglycemia (HCC)   ESRD (end stage renal disease) on PD   Increased anion gap metabolic acidosis   Brief narrative: Barbara Donovan is a 31 y.o. female with PMH significant for type 1 diabetes mellitus since the age of 89, ESRD on peritoneal dialysis since May 2022, diabetic gastroparesis, retinopathy, hypertension, frequent hospitalization because of chronic nausea vomiting. Patient presented to the ED on 02/23/2021 for flareup of gastroparesis with persistent nausea, vomiting.  In the ED, she was tachycardic to 116, blood pressure elevated up to 190s. Lab with sodium 134, glucose elevated at 226, anion gap 18 Urinalysis with hazy yellow urine, small hemoglobin, monitor and protein, rare bacteria. urine drug screen negative  Patient was started on IV fluid, IV antibiotics. Keep in observation and hospitalist service.  Subjective: Patient was seen and examined this morning.  Pleasant young African-American female.  Sitting up in bed.  Nausea improving.  Has not thrown up since last night. Currently on clear liquid diet.  Wants to advance   Hospital course: Intractable nausea and vomiting Acute flareup of diabetic gastroparesis -Patient states recently her son was sick which stressed her out and she thinks it precipitated her current episode.  At home, she is on Reglan 3 times  daily as well as as needed Phenergan and Compazine. -In the hospital, she was treated with IV Reglan 10 mg every 6 scheduled. -No vomiting last 24 hours.  Able to tolerate regular consistency diet.  Adequately hydrated.  Feels ready for discharge today.  ESRD (end stage renal disease) on PD -Nephrology consult appreciated.  Patient to continue peritoneal dialysis at home.  Type 1 diabetes mellitus with hyperglycemia -A1c 6.9 from January 2023 -Home meds include Lantus 20 units nightly and NovoLog 3 units pre-meal -Continue the same. Recent Labs  Lab 02/24/21 0803 02/24/21 1129 02/24/21 1647 02/24/21 2044 02/25/21 0732  GLUCAP 159* 177* 145* 171* 153*   Uncontrolled hypertension  -Home meds include Coreg 25 mg twice daily, amlodipine 10 mg daily, losartan 50 mg daily. -Blood pressure was elevated up to 190s on presentation probably because of the stress of nausea vomiting.  Currently blood pressure is well controlled on home meds.  Continue the same.    Mobility: Encourage ambulation Goals of care   Code Status: Full Code   Nutritional status:  Body mass index is 23.24 kg/m.       Discharge Exam:   Vitals:   02/24/21 2319 02/25/21 0500 02/25/21 0543 02/25/21 0730  BP: 119/79  (!) 146/97 126/80  Pulse: 93  96 (!) 102  Resp: 16  18 17   Temp: 98.5 F (36.9 C)  99.1 F (37.3 C) 98.2 F (36.8 C)  TempSrc: Oral  Oral Oral  SpO2: 97%  98% 98%  Weight:  65.3 kg    Height:        Body mass index is 23.24 kg/m.   General exam: Pleasant, young African-American female.  Not in physical distress Skin:  No rashes, lesions or ulcers. HEENT: Atraumatic, normocephalic, no obvious bleeding Lungs: Clear to auscultation bilaterally CVS: Regular rate and rhythm, no murmur GI/Abd soft, mild epigastric tenderness, bowel sound present CNS: Alert, awake, oriented x3 Psychiatry: Sad affect Extremities: No pedal edema, no calf tenderness  Follow ups:    Follow-up Information      Scarlett Presto, MD. Daphane Shepherd on 03/11/2021.   Specialty: Internal Medicine Why: [id number], PCP establishment, Please arrive 15 min early to complete paperwork Contact information: [place] [address] [phone]         Sueanne Margarita, MD Follow up.   Specialty: Cardiology Contact information: [address] [place] [address] [phone]                 Discharge Instructions:   Discharge Instructions     Ambulatory referral to Nutrition and Diabetic Education   Complete by: As directed    Call MD for:  difficulty breathing, headache or visual disturbances   Complete by: As directed    Call MD for:  extreme fatigue   Complete by: As directed    Call MD for:  hives   Complete by: As directed    Call MD for:  persistant dizziness or light-headedness   Complete by: As directed    Call MD for:  persistant nausea and vomiting   Complete by: As directed    Call MD for:  severe uncontrolled pain   Complete by: As directed    Call MD for:  temperature >100.4   Complete by: As directed    Diet Carb Modified   Complete by: As directed    Discharge instructions   Complete by: As directed    1. Continue to comply with your insulin regimen and dietary discipline. 2. Continue to follow-up with endocrinology, nephrology 3. Continue Reglan and as needed antiemetic  Discharge instructions for diabetes mellitus: Check blood sugar 3 times a day and bedtime at home. If blood sugar running above 200 or less than 70 please call your MD to adjust insulin. If you notice signs and symptoms of hypoglycemia (low blood sugar) like jitteriness, confusion, thirst, tremor and sweating, please check blood sugar, drink sugary drink/biscuits/sweets to increase sugar level and call MD or return to ER.    General discharge instructions: Follow with Primary MD No primary care provider on file. in 7 days  Please request your PCP  to go over your  hospital tests, procedures, radiology results at the follow up. Please get your medicines reviewed and adjusted.  Your PCP may decide to repeat certain labs or tests as needed. Do not drive, operate heavy machinery, perform activities at heights, swimming or participation in water activities or provide baby sitting services if your were admitted for syncope or siezures until you have seen by Primary MD or a Neurologist and advised to do so again. [place] Controlled Substance Reporting System database was reviewed. Do not drive, operate heavy machinery, perform activities at heights, swim, participate in water activities or provide baby-sitting services while on medications for pain, sleep and mood until your outpatient physician has reevaluated you and advised to do so again.  You are strongly recommended to comply with the dose, frequency and duration of prescribed medications. Activity: As tolerated with Full fall precautions use walker/cane & assistance as needed Avoid using any recreational substances like cigarette, tobacco, alcohol, or non-prescribed drug. If you experience worsening of your admission symptoms, develop shortness of  breath, life threatening emergency, suicidal or homicidal thoughts you must seek medical attention immediately by calling 911 or calling your MD immediately  if symptoms less severe. You must read complete instructions/literature along with all the possible adverse reactions/side effects for all the medicines you take and that have been prescribed to you. Take any new medicine only after you have completely understood and accepted all the possible adverse reactions/side effects.  Wear Seat belts while driving. You were cared for by a hospitalist during your hospital stay. If you have any questions about your discharge medications or the care you received while you were in the hospital after you are discharged, you can call the unit and ask to speak with the  hospitalist or the covering physician. Once you are discharged, your primary care physician will handle any further medical issues. Please note that NO REFILLS for any discharge medications will be authorized once you are discharged, as it is imperative that you return to your primary care physician (or establish a relationship with a primary care physician if you do not have one).   Discharge wound care:   Complete by: As directed    Increase activity slowly   Complete by: As directed        Discharge Medications:   Allergies as of 02/25/2021   No Known Allergies      Medication List     STOP taking these medications    clonazePAM 0.5 MG tablet Commonly known as: KLONOPIN   ondansetron 4 MG disintegrating tablet Commonly known as: Zofran ODT       TAKE these medications    acetaminophen 500 MG tablet Commonly known as: TYLENOL Take 1,000 mg by mouth every 6 (six) hours as needed for mild pain.   amLODipine 10 MG tablet Commonly known as: NORVASC Take 1 tablet (10 mg total) by mouth daily.   calcitRIOL 0.5 MCG capsule Commonly known as: ROCALTROL Take 0.5 mcg by mouth daily.   carvedilol 25 MG tablet Commonly known as: COREG Take 1 tablet (25 mg total) by mouth 2 (two) times daily.   Continuous Blood Gluc Sensor Misc 1 each by Does not apply route as directed. Use as directed every 14 days. May dispense FreeStyle [employer] or similar.   gentamicin cream 0.1 % Commonly known as: GARAMYCIN Apply 1 application topically daily.   heparin sodium (porcine) 1000 UNIT/ML injection Inject 3 mLs into the vein daily.   hydrALAZINE 50 MG tablet Commonly known as: APRESOLINE Take 1 tablet (50 mg total) by mouth 3 (three) times daily.   insulin lispro 100 UNIT/ML KwikPen Commonly known as: HumaLOG KwikPen Inject 3 Units into the skin 3 (three) times daily with meals.   Lantus SoloStar 100 UNIT/ML Solostar Pen Generic drug: insulin glargine Inject 20 Units  into the skin every morning.   losartan 50 MG tablet Commonly known as: COZAAR Take 2 tablets (100 mg total) by mouth daily. What changed: how much to take   metoCLOPramide 5 MG tablet Commonly known as: REGLAN Take 1 tablet (5 mg total) by mouth 3 (three) times daily before meals.   mirtazapine 7.5 MG tablet Commonly known as: REMERON Take 1 tablet (7.5 mg total) by mouth at bedtime.   multivitamin Tabs tablet Take 1 tablet by mouth at bedtime.   OneTouch Verio test strip Generic drug: glucose blood 1 each by Other route 3 (three) times daily. And lancets 3/day What changed: Another medication with the same name was added. Make sure  you understand how and when to take each.   FREESTYLE TEST STRIPS test strip Generic drug: glucose blood Use as instructed What changed: You were already taking a medication with the same name, and this prescription was added. Make sure you understand how and when to take each.   promethazine 25 MG suppository Commonly known as: PHENERGAN Place 1 suppository (25 mg total) rectally every 6 (six) hours as needed for nausea or vomiting.   sevelamer carbonate 800 MG tablet Commonly known as: RENVELA Take 1 tablet (800 mg total) by mouth 3 (three) times daily with meals.               Discharge Care Instructions  (From admission, onward)           Start     Ordered   02/25/21 0000  Discharge wound care:        02/25/21 1042             The results of significant diagnostics from this hospitalization (including imaging, microbiology, ancillary and laboratory) are listed below for reference.    Procedures and Diagnostic Studies:   No results found.   Labs:   Basic Metabolic Panel: Recent Labs  Lab 02/18/21 2010 02/23/21 0930 02/23/21 0947 02/23/21 1613 02/23/21 1625 02/24/21 0634 02/25/21 0302  NA 133* 135 135 134* 133* 136 132*  K 5.4* 4.2 4.2 4.2 4.0 3.9 4.0  CL 98 95*  --  93*  --  99 98  CO2 20* 20*  --  23   --  22 19*  GLUCOSE 153* 376*  --  226*  --  155* 154*  BUN 63* 64*  --  65*  --  68* 74*  CREATININE 8.75* 10.47*  --  10.23*  --  10.56* 10.01*  CALCIUM 10.3 10.0  --  9.8  --  8.9 9.3  MG  --   --   --   --   --  2.2  --    GFR Estimated Creatinine Clearance: 7.7 mL/min (A) (by C-G formula based on SCr of 10.01 mg/dL (H)). Liver Function Tests: Recent Labs  Lab 02/18/21 2010 02/23/21 0930 02/24/21 0634  AST 14* 15 12*  ALT 14 17 12   ALKPHOS 60 65 54  BILITOT 0.4 0.4 0.3  PROT 7.5 7.6 6.6  ALBUMIN 3.4* 3.7 3.3*   Recent Labs  Lab 02/18/21 2010 02/23/21 0930  LIPASE 110* 67*   No results for input(s): AMMONIA in the last 168 hours. Coagulation profile No results for input(s): INR, PROTIME in the last 168 hours.  CBC: Recent Labs  Lab 02/18/21 2010 02/23/21 0930 02/23/21 0947 02/23/21 1625 02/24/21 0634 02/25/21 0302  WBC 8.5 6.8  --   --  9.8 8.6  NEUTROABS 6.3 5.7  --   --  6.9 7.6  HGB 12.9 11.8* 12.6 12.9 11.3* 11.3*  HCT 41.2 37.3 37.0 38.0 34.4* 35.1*  MCV 88.2 86.5  --   --  87.1 85.2  PLT 429* 441*  --   --  415* 389   Cardiac Enzymes: No results for input(s): CKTOTAL, CKMB, CKMBINDEX, TROPONINI in the last 168 hours. BNP: Invalid input(s): POCBNP CBG: Recent Labs  Lab 02/24/21 0803 02/24/21 1129 02/24/21 1647 02/24/21 2044 02/25/21 0732  GLUCAP 159* 177* 145* 171* 153*   D-Dimer No results for input(s): DDIMER in the last 72 hours. Hgb A1c No results for input(s): HGBA1C in the last 72 hours. Lipid Profile No results for input(s): CHOL,  HDL, LDLCALC, TRIG, CHOLHDL, LDLDIRECT in the last 72 hours. Thyroid function studies No results for input(s): TSH, T4TOTAL, T3FREE, THYROIDAB in the last 72 hours.  Invalid input(s): FREET3 Anemia work up No results for input(s): VITAMINB12, FOLATE, FERRITIN, TIBC, IRON, RETICCTPCT in the last 72 hours. Microbiology Recent Results (from the past 240 hour(s))  Resp Panel by RT-PCR (Flu A&B, Covid)  Nasopharyngeal Swab     Status: None   Collection Time: 02/23/21  9:57 PM   Specimen: Nasopharyngeal Swab; Nasopharyngeal(NP) swabs in vial transport medium  Result Value Ref Range Status   SARS Coronavirus 2 by RT PCR NEGATIVE NEGATIVE Final    Comment: (NOTE) SARS-CoV-2 target nucleic acids are NOT DETECTED.  The SARS-CoV-2 RNA is generally detectable in upper respiratory specimens during the acute phase of infection. The lowest concentration of SARS-CoV-2 viral copies this assay can detect is 138 copies/mL. A negative result does not preclude SARS-Cov-2 infection and should not be used as the sole basis for treatment or other patient management decisions. A negative result may occur with  improper specimen collection/handling, submission of specimen other than nasopharyngeal swab, presence of viral mutation(s) within the areas targeted by this assay, and inadequate number of viral copies(<138 copies/mL). A negative result must be combined with clinical observations, patient history, and epidemiological information. The expected result is Negative.  Fact Sheet for Patients:  EntrepreneurPulse.com.au  Fact Sheet for Healthcare Providers:  IncredibleEmployment.be  This test is no t yet approved or cleared by the [place] FDA and  has been authorized for detection and/or diagnosis of SARS-CoV-2 by FDA under an Emergency Use Authorization (EUA). This EUA will remain  in effect (meaning this test can be used) for the duration of the COVID-19 declaration under Section 564(b)(1) of the Act, 21 U.S.C.section 360bbb-3(b)(1), unless the authorization is terminated  or revoked sooner.       Influenza A by PCR NEGATIVE NEGATIVE Final   Influenza B by PCR NEGATIVE NEGATIVE Final    Comment: (NOTE) The Xpert Xpress SARS-CoV-2/FLU/RSV plus assay is intended as an aid in the diagnosis of influenza from Nasopharyngeal swab specimens and should not be  used as a sole basis for treatment. Nasal washings and aspirates are unacceptable for Xpert Xpress SARS-CoV-2/FLU/RSV testing.  Fact Sheet for Patients: EntrepreneurPulse.com.au  Fact Sheet for Healthcare Providers: IncredibleEmployment.be  This test is not yet approved or cleared by the [place] FDA and has been authorized for detection and/or diagnosis of SARS-CoV-2 by FDA under an Emergency Use Authorization (EUA). This EUA will remain in effect (meaning this test can be used) for the duration of the COVID-19 declaration under Section 564(b)(1) of the Act, 21 U.S.C. section 360bbb-3(b)(1), unless the authorization is terminated or revoked.  Performed at [facility], [place] [address], [place], [place] [address]     Time coordinating discharge: 35 minutes  Signed: Adreanne Yono  [facility] 02/25/2021, 10:42 AM

## 2021-02-25 NOTE — Progress Notes (Signed)
Dc lounge is patients new location now

## 2021-02-25 NOTE — Plan of Care (Signed)
  Problem: Education: Goal: Knowledge of General Education information will improve Description Including pain rating scale, medication(s)/side effects and non-pharmacologic comfort measures Outcome: Progressing   

## 2021-02-25 NOTE — Progress Notes (Signed)
[facility] Progress Note   Subjective:   Patient seen and examined at bedside.  Feeling much better today.  Nausea and vomiting resolved.  Able to tolerate breakfast this AM.  Unable to have PD last night due to not having an available machine. Denies abdominal pain, CP, SOB, orthopnea, edema, dizziness, weakness and fatigue.   Objective Vitals:   02/24/21 2319 02/25/21 0500 02/25/21 0543 02/25/21 0730  BP: 119/79  (!) 146/97 126/80  Pulse: 93  96 (!) 102  Resp: 16  18 17   Temp: 98.5 F (36.9 C)  99.1 F (37.3 C) 98.2 F (36.8 C)  TempSrc: Oral  Oral Oral  SpO2: 97%  98% 98%  Weight:  65.3 kg    Height:       Physical Exam General:well appearing female in NAD Heart:RRR, no mrg Lungs:CTAB, nml WOB on RA Abdomen:soft, NTND Extremities:no LE edema Dialysis Access: LLQ PD catheter   Filed Weights   02/23/21 1549 02/25/21 0500  Weight: 64 kg 65.3 kg    Intake/Output Summary (Last 24 hours) at 02/25/2021 1119 Last data filed at 02/25/2021 1000 Gross per 24 hour  Intake 861.67 ml  Output --  Net 861.67 ml    Additional Objective Labs: Basic Metabolic Panel: Recent Labs  Lab 02/23/21 1613 02/23/21 1625 02/24/21 0634 02/25/21 0302  NA 134* 133* 136 132*  K 4.2 4.0 3.9 4.0  CL 93*  --  99 98  CO2 23  --  22 19*  GLUCOSE 226*  --  155* 154*  BUN 65*  --  68* 74*  CREATININE 10.23*  --  10.56* 10.01*  CALCIUM 9.8  --  8.9 9.3   Liver Function Tests: Recent Labs  Lab 02/18/21 2010 02/23/21 0930 02/24/21 0634  AST 14* 15 12*  ALT 14 17 12   ALKPHOS 60 65 54  BILITOT 0.4 0.4 0.3  PROT 7.5 7.6 6.6  ALBUMIN 3.4* 3.7 3.3*   Recent Labs  Lab 02/18/21 2010 02/23/21 0930  LIPASE 110* 67*   CBC: Recent Labs  Lab 02/18/21 2010 02/23/21 0930 02/23/21 0947 02/23/21 1625 02/24/21 0634 02/25/21 0302  WBC 8.5 6.8  --   --  9.8 8.6  NEUTROABS 6.3 5.7  --   --  6.9 7.6  HGB 12.9 11.8*   < > 12.9 11.3* 11.3*  HCT 41.2 37.3   < > 38.0 34.4* 35.1*   MCV 88.2 86.5  --   --  87.1 85.2  PLT 429* 441*  --   --  415* 389   < > = values in this interval not displayed.   Recent Labs  Lab 02/24/21 0803 02/24/21 1129 02/24/21 1647 02/24/21 2044 02/25/21 0732  GLUCAP 159* 177* 145* 171* 153*    Medications:  dialysis solution 1.5% low-MG/low-CA      amLODipine  10 mg Oral Daily   calcitRIOL  0.5 mcg Oral Daily   carvedilol  25 mg Oral BID   gentamicin cream  1 application Topical Daily   heparin  5,000 Units Subcutaneous Q8H   hydrALAZINE  50 mg Oral TID   insulin aspart  0-5 Units Subcutaneous QHS   insulin aspart  0-9 Units Subcutaneous TID WC   insulin glargine-yfgn  20 Units Subcutaneous QHS   losartan  50 mg Oral Daily   metoCLOPramide (REGLAN) injection  10 mg Intravenous Q6H   mirtazapine  7.5 mg Oral QHS   sevelamer carbonate  800 mg Oral TID WC    Dialysis  Orders: 5 exchanges, fill vol 2000 ml, 63kg , 1.5 hr dwell time, no day bag  Assessment/Plan: Intractable N/V - d/t gastroparesis. Improved. per pmd ESRD - on PD.  Can continue PD as outpatient tonight at home. BP/volume - Blood pressure at goal.  Does not appear volume overloaded.  Advised to continue regular PD orders.  DM1 - per pmd Anemia ckd - Hb 11, no esa needs MBD ckd - Ca ok. cont binder ac  Jen Mow, PA-C [facility] 02/25/2021,11:19 AM  LOS: 1 day

## 2021-03-11 ENCOUNTER — Encounter: Payer: Self-pay | Admitting: Internal Medicine

## 2021-03-11 ENCOUNTER — Ambulatory Visit (INDEPENDENT_AMBULATORY_CARE_PROVIDER_SITE_OTHER): Payer: 59 | Admitting: Internal Medicine

## 2021-03-11 ENCOUNTER — Other Ambulatory Visit (HOSPITAL_COMMUNITY)
Admission: RE | Admit: 2021-03-11 | Discharge: 2021-03-11 | Disposition: A | Discharge status: Home / Self Care | Payer: 59 | Source: Ambulatory Visit | Attending: Internal Medicine | Admitting: Internal Medicine

## 2021-03-11 VITALS — BP 107/68 | HR 90 | Temp 98.3°F | Ht 66.0 in | Wt 151.0 lb

## 2021-03-11 DIAGNOSIS — F32A Depression, unspecified: Secondary | ICD-10-CM | POA: Diagnosis not present

## 2021-03-11 DIAGNOSIS — I120 Hypertensive chronic kidney disease with stage 5 chronic kidney disease or end stage renal disease: Secondary | ICD-10-CM | POA: Diagnosis not present

## 2021-03-11 DIAGNOSIS — E08319 Diabetes mellitus due to underlying condition with unspecified diabetic retinopathy without macular edema: Secondary | ICD-10-CM

## 2021-03-11 DIAGNOSIS — Z124 Encounter for screening for malignant neoplasm of cervix: Secondary | ICD-10-CM | POA: Diagnosis not present

## 2021-03-11 DIAGNOSIS — K3184 Gastroparesis: Secondary | ICD-10-CM

## 2021-03-11 DIAGNOSIS — I10 Essential (primary) hypertension: Secondary | ICD-10-CM

## 2021-03-11 DIAGNOSIS — N186 End stage renal disease: Secondary | ICD-10-CM | POA: Diagnosis not present

## 2021-03-11 DIAGNOSIS — Z01419 Encounter for gynecological examination (general) (routine) without abnormal findings: Secondary | ICD-10-CM | POA: Diagnosis not present

## 2021-03-11 DIAGNOSIS — F419 Anxiety disorder, unspecified: Secondary | ICD-10-CM | POA: Diagnosis not present

## 2021-03-11 DIAGNOSIS — Z992 Dependence on renal dialysis: Secondary | ICD-10-CM | POA: Diagnosis not present

## 2021-03-11 DIAGNOSIS — E1165 Type 2 diabetes mellitus with hyperglycemia: Secondary | ICD-10-CM

## 2021-03-11 DIAGNOSIS — E1065 Type 1 diabetes mellitus with hyperglycemia: Secondary | ICD-10-CM

## 2021-03-11 DIAGNOSIS — E1143 Type 2 diabetes mellitus with diabetic autonomic (poly)neuropathy: Secondary | ICD-10-CM

## 2021-03-11 DIAGNOSIS — Z1151 Encounter for screening for human papillomavirus (HPV): Secondary | ICD-10-CM | POA: Insufficient documentation

## 2021-03-11 DIAGNOSIS — E1122 Type 2 diabetes mellitus with diabetic chronic kidney disease: Secondary | ICD-10-CM | POA: Diagnosis not present

## 2021-03-11 DIAGNOSIS — Z0000 Encounter for general adult medical examination without abnormal findings: Secondary | ICD-10-CM

## 2021-03-11 NOTE — Assessment & Plan Note (Signed)
Patient follows with endocrinology. Last A1c 7.0 - continue to follow with endocrinology

## 2021-03-11 NOTE — Patient Instructions (Signed)
Barbara Donovan  It was a pleasure seeing you in the clinic today.   We talked about your health, your diabetes, your blood pressure, and did a pap smear. I will call you with the results if anything comes back abnormal. Continue to follow with your eye doctor, the heart doctor and your endocrinologist.   Please call our clinic at [phone] if you have any questions or concerns. The best time to call is Monday-Friday from 9am-4pm, but there is someone available 24/7 at the same number. If you need medication refills, please notify your pharmacy one week in advance and they will send [name] a request.   Thank you for letting us take part in your care. We look forward to seeing you next time!

## 2021-03-11 NOTE — Assessment & Plan Note (Signed)
Currently well controlled, follows sometimes with the cardiologist for this. She takes norvasc, carvedilol, and hydralazine. - continue current therapy, follow up in 6 months

## 2021-03-11 NOTE — Assessment & Plan Note (Signed)
Saw the eye doctor this month

## 2021-03-11 NOTE — Assessment & Plan Note (Signed)
Patient reports having an appointment scheduled to see a therapist. She was recently prescribed mirtazapine which she feels like has been helping. Denies any SI.  - continue to monitor

## 2021-03-11 NOTE — Progress Notes (Addendum)
° °  CC: establish care  HPI:  Barbara Donovan is a 31 y.o. PMH noted below, who presents to the [facility] with complaints of establishing care. To see the management of his acute and chronic conditions, please refer to the A&P note under the encounters tab.   Past Medical History:  Diagnosis Date   Asthma    as a child, no problems as an adult, no inhaler   Cataract    NS OU   Chronic hypertension during pregnancy, antepartum 08/19/2017   Dehydration 01/28/2018   Depression during pregnancy, antepartum 07/07/2017   6/20: Short trial of zoloft previously, reports didn't help much but also didn't give it a chance Discussed r/b/a SSRIs in pregnancy, agrees to try Zoloft again, rx sent No SI/HI/red flags   Diabetes ([place])    TYPE I. A1C 7.5% 05/31/20   Diabetic retinopathy ([place]) 06/09/2017   07/2017 with bilateral severe diabetic non-proliferative retinopathy with macular edema.   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 A2/B GDM)  Required Referrals for A1GDM or A2GDM: [x]  Diabetes Education and Testing Supplies [x]  Nutrition Cousult  For A2/B GDM or higher classes of DM [x]  Diabetes Education and Testing Supplies [x]  Nutrition Counsult [x]  Fetal ECHO after 20 weeks  [x]  Eye exam for retina evaluation - severe retinopathy 7/19  Base   Ventricular septal defect (VSD) of fetus in singleton pregnancy, antepartum 09/30/2017   May go to newborn nursery per Dr. Lenard Simmer Echo prior to discharge   Family History  Problem Relation Age of Onset    Diabetes Mother    Aneurysm Mother 60   Seizures Mother    Diabetes Father    Cataracts Father    COPD Father    Heart attack Father    Heart disease Father    Healthy Sister    Healthy Daughter    Stroke Maternal Grandfather    Amblyopia Neg Hx    Blindness Neg Hx    Glaucoma Neg Hx    Macular degeneration Neg Hx    Retinal detachment Neg Hx    Strabismus Neg Hx    Retinitis pigmentosa Neg Hx    Colon cancer Neg Hx    Stomach cancer Neg Hx    Esophageal cancer Neg Hx    Pancreatic cancer Neg Hx    Liver disease Neg Hx    Social history: feels well supported, does not smoke, drink alcohol, or use recreational drugs  Review of Systems:  negative for nausea, vomiting, SI; positive for anxiety and depression  Physical Exam: Gen: WNWD female in NAD HEENT: normocephalic atraumatic, MMM CV: RRR, no m/r/g   Resp: CTAB, normal WOB  GI: soft, nontender GU: normal labia with no lesions, normal internal anatomy with normal appearing cervix, scant discharge MSK: moves all extremities without difficulty Skin:warm and dry Neuro:alert answering questions appropriately Psych: normal affect   Assessment & Plan:   See Encounters Tab for problem based charting.  Patient discussed with Dr.  Saverio Danker

## 2021-03-11 NOTE — Assessment & Plan Note (Signed)
Patient has been doing better since discharging from the hospital, endorses eating and drinking ok. She takes the reglan or the phenergan when she feels nauseous.  - ctm

## 2021-03-11 NOTE — Assessment & Plan Note (Signed)
Does PD at home daily. Denies any irritation to the site.

## 2021-03-13 LAB — CYTOLOGY - PAP
Comment: NEGATIVE
Diagnosis: NEGATIVE
High risk HPV: NEGATIVE

## 2021-03-13 NOTE — Progress Notes (Signed)
Internal Medicine Clinic Attending ° °Case discussed with Dr. DeMaio  At the time of the visit.  We reviewed the resident’s history and exam and pertinent patient test results.  I agree with the assessment, diagnosis, and plan of care documented in the resident’s note. ° ° °

## 2021-03-21 ENCOUNTER — Other Ambulatory Visit: Payer: Self-pay

## 2021-03-21 ENCOUNTER — Observation Stay (HOSPITAL_COMMUNITY)
Admission: EM | Admit: 2021-03-21 | Discharge: 2021-03-22 | Disposition: A | Discharge status: Home / Self Care | Payer: 59 | Attending: Internal Medicine | Admitting: Internal Medicine

## 2021-03-21 DIAGNOSIS — E1022 Type 1 diabetes mellitus with diabetic chronic kidney disease: Secondary | ICD-10-CM | POA: Insufficient documentation

## 2021-03-21 DIAGNOSIS — N186 End stage renal disease: Secondary | ICD-10-CM | POA: Insufficient documentation

## 2021-03-21 DIAGNOSIS — K3184 Gastroparesis: Secondary | ICD-10-CM | POA: Diagnosis not present

## 2021-03-21 DIAGNOSIS — E8729 Other acidosis: Secondary | ICD-10-CM | POA: Diagnosis present

## 2021-03-21 DIAGNOSIS — R109 Unspecified abdominal pain: Secondary | ICD-10-CM | POA: Diagnosis present

## 2021-03-21 DIAGNOSIS — I10 Essential (primary) hypertension: Secondary | ICD-10-CM | POA: Diagnosis present

## 2021-03-21 DIAGNOSIS — Z825 Family history of asthma and other chronic lower respiratory diseases: Secondary | ICD-10-CM

## 2021-03-21 DIAGNOSIS — R112 Nausea with vomiting, unspecified: Secondary | ICD-10-CM | POA: Diagnosis not present

## 2021-03-21 DIAGNOSIS — E1065 Type 1 diabetes mellitus with hyperglycemia: Secondary | ICD-10-CM | POA: Diagnosis not present

## 2021-03-21 DIAGNOSIS — Z79899 Other long term (current) drug therapy: Secondary | ICD-10-CM | POA: Diagnosis not present

## 2021-03-21 DIAGNOSIS — Z8249 Family history of ischemic heart disease and other diseases of the circulatory system: Secondary | ICD-10-CM

## 2021-03-21 DIAGNOSIS — J45909 Unspecified asthma, uncomplicated: Secondary | ICD-10-CM | POA: Diagnosis present

## 2021-03-21 DIAGNOSIS — E10319 Type 1 diabetes mellitus with unspecified diabetic retinopathy without macular edema: Secondary | ICD-10-CM | POA: Diagnosis present

## 2021-03-21 DIAGNOSIS — Z20822 Contact with and (suspected) exposure to covid-19: Secondary | ICD-10-CM | POA: Insufficient documentation

## 2021-03-21 DIAGNOSIS — E872 Acidosis, unspecified: Secondary | ICD-10-CM | POA: Diagnosis present

## 2021-03-21 DIAGNOSIS — I120 Hypertensive chronic kidney disease with stage 5 chronic kidney disease or end stage renal disease: Secondary | ICD-10-CM | POA: Diagnosis not present

## 2021-03-21 DIAGNOSIS — Z794 Long term (current) use of insulin: Secondary | ICD-10-CM | POA: Insufficient documentation

## 2021-03-21 DIAGNOSIS — D649 Anemia, unspecified: Secondary | ICD-10-CM | POA: Diagnosis present

## 2021-03-21 DIAGNOSIS — Z823 Family history of stroke: Secondary | ICD-10-CM

## 2021-03-21 DIAGNOSIS — E1043 Type 1 diabetes mellitus with diabetic autonomic (poly)neuropathy: Secondary | ICD-10-CM | POA: Diagnosis present

## 2021-03-21 DIAGNOSIS — Z833 Family history of diabetes mellitus: Secondary | ICD-10-CM

## 2021-03-21 DIAGNOSIS — Z992 Dependence on renal dialysis: Secondary | ICD-10-CM | POA: Diagnosis present

## 2021-03-21 LAB — BASIC METABOLIC PANEL
Anion gap: 22 — ABNORMAL HIGH (ref 5–15)
Anion gap: 18 — ABNORMAL HIGH (ref 5–15)
BUN: 57 mg/dL — ABNORMAL HIGH (ref 6–20)
BUN: 56 mg/dL — ABNORMAL HIGH (ref 6–20)
CO2: 17 mmol/L — ABNORMAL LOW (ref 22–32)
CO2: 19 mmol/L — ABNORMAL LOW (ref 22–32)
Calcium: 10.2 mg/dL (ref 8.9–10.3)
Calcium: 9.1 mg/dL (ref 8.9–10.3)
Chloride: 98 mmol/L (ref 98–111)
Chloride: 102 mmol/L (ref 98–111)
Creatinine, Ser: 10.15 mg/dL — ABNORMAL HIGH (ref 0.44–1.00)
Creatinine, Ser: 9.82 mg/dL — ABNORMAL HIGH (ref 0.44–1.00)
GFR, Estimated: 5 mL/min — ABNORMAL LOW (ref >60)
GFR, Estimated: 5 mL/min — ABNORMAL LOW (ref >60)
Glucose, Bld: 200 mg/dL — ABNORMAL HIGH (ref 70–99)
Glucose, Bld: 121 mg/dL — ABNORMAL HIGH (ref 70–99)
Potassium: 4.2 mmol/L (ref 3.5–5.1)
Potassium: 3.4 mmol/L — ABNORMAL LOW (ref 3.5–5.1)
Sodium: 137 mmol/L (ref 135–145)
Sodium: 139 mmol/L (ref 135–145)

## 2021-03-21 LAB — I-STAT VENOUS BLOOD GAS, ED
Acid-Base Excess: 0 mmol/L (ref 0.0–2.0)
Bicarbonate: 21.3 mmol/L (ref 20.0–28.0)
Calcium, Ion: 1.08 mmol/L — ABNORMAL LOW (ref 1.15–1.40)
HCT: 39 % (ref 36.0–46.0)
Hemoglobin: 13.3 g/dL (ref 12.0–15.0)
O2 Saturation: 96 %
Potassium: 5.4 mmol/L — ABNORMAL HIGH (ref 3.5–5.1)
Sodium: 134 mmol/L — ABNORMAL LOW (ref 135–145)
TCO2: 22 mmol/L (ref 22–32)
pCO2, Ven: 25.7 mmHg — ABNORMAL LOW (ref 44–60)
pH, Ven: 7.528 — ABNORMAL HIGH (ref 7.25–7.43)
pO2, Ven: 72 mmHg — ABNORMAL HIGH (ref 32–45)

## 2021-03-21 LAB — CBC WITH DIFFERENTIAL/PLATELET
Abs Immature Granulocytes: 0.03 10*3/uL (ref 0.00–0.07)
Basophils Absolute: 0 10*3/uL (ref 0.0–0.1)
Basophils Relative: 1 %
Eosinophils Absolute: 0 10*3/uL (ref 0.0–0.5)
Eosinophils Relative: 0 %
HCT: 38.6 % (ref 36.0–46.0)
Hemoglobin: 12.4 g/dL (ref 12.0–15.0)
Immature Granulocytes: 1 %
Lymphocytes Relative: 14 %
Lymphs Abs: 0.9 10*3/uL (ref 0.7–4.0)
MCH: 28 pg (ref 26.0–34.0)
MCHC: 32.1 g/dL (ref 30.0–36.0)
MCV: 87.1 fL (ref 80.0–100.0)
Monocytes Absolute: 0.3 10*3/uL (ref 0.1–1.0)
Monocytes Relative: 5 %
Neutro Abs: 5.3 10*3/uL (ref 1.7–7.7)
Neutrophils Relative %: 79 %
Platelets: 387 10*3/uL (ref 150–400)
RBC: 4.43 MIL/uL (ref 3.87–5.11)
RDW: 15.7 % — ABNORMAL HIGH (ref 11.5–15.5)
WBC: 6.5 10*3/uL (ref 4.0–10.5)
nRBC: 0 % (ref 0.0–0.2)

## 2021-03-21 LAB — BETA-HYDROXYBUTYRIC ACID: Beta-Hydroxybutyric Acid: 0.54 mmol/L — ABNORMAL HIGH (ref 0.05–0.27)

## 2021-03-21 LAB — HEPATIC FUNCTION PANEL
ALT: 19 U/L (ref 0–44)
ALT: 11 U/L (ref 0–44)
AST: 23 U/L (ref 15–41)
AST: 11 U/L — ABNORMAL LOW (ref 15–41)
Albumin: 3.8 g/dL (ref 3.5–5.0)
Albumin: 3.1 g/dL — ABNORMAL LOW (ref 3.5–5.0)
Alkaline Phosphatase: 59 U/L (ref 38–126)
Alkaline Phosphatase: 44 U/L (ref 38–126)
Bilirubin, Direct: 0.2 mg/dL (ref 0.0–0.2)
Bilirubin, Direct: <0.1 mg/dL (ref 0.0–0.2)
Indirect Bilirubin: 0.3 mg/dL (ref 0.3–0.9)
Total Bilirubin: 0.5 mg/dL (ref 0.3–1.2)
Total Bilirubin: 0.4 mg/dL (ref 0.3–1.2)
Total Protein: 7.8 g/dL (ref 6.5–8.1)
Total Protein: 6.3 g/dL — ABNORMAL LOW (ref 6.5–8.1)

## 2021-03-21 LAB — TSH: TSH: 2.687 u[IU]/mL (ref 0.350–4.500)

## 2021-03-21 LAB — I-STAT BETA HCG BLOOD, ED (MC, WL, AP ONLY): I-stat hCG, quantitative: <5 m[IU]/mL (ref <5)

## 2021-03-21 LAB — LIPASE, BLOOD: Lipase: 47 U/L (ref 11–51)

## 2021-03-21 LAB — RESP PANEL BY RT-PCR (FLU A&B, COVID) ARPGX2
Influenza A by PCR: NEGATIVE
Influenza B by PCR: NEGATIVE
SARS Coronavirus 2 by RT PCR: NEGATIVE

## 2021-03-21 LAB — MAGNESIUM: Magnesium: 2.4 mg/dL (ref 1.7–2.4)

## 2021-03-21 MED ORDER — LACTATED RINGERS IV SOLN: INTRAVENOUS | Status: AC

## 2021-03-21 MED ORDER — METOCLOPRAMIDE HCL 5 MG/ML IJ SOLN
10.0000 mg | Freq: Four times a day (QID) | INTRAMUSCULAR | Status: DC
  Administered 2021-03-21 – 2021-03-22 (×4): 10 mg via INTRAVENOUS
  Filled 2021-03-21 (×4): qty 2

## 2021-03-21 MED ORDER — SODIUM CHLORIDE 0.9 % IV BOLUS: 500.0000 mL | Freq: Once | INTRAVENOUS | Status: DC

## 2021-03-21 MED ORDER — LABETALOL HCL 5 MG/ML IV SOLN: 20.0000 mg | INTRAVENOUS | Status: DC | PRN

## 2021-03-21 MED ORDER — INSULIN GLARGINE-YFGN 100 UNIT/ML ~~LOC~~ SOLN
16.0000 [IU] | Freq: Every day | SUBCUTANEOUS | Status: DC
Start: 2021-03-21 — End: 2021-03-23
  Administered 2021-03-21: 16 [IU] via SUBCUTANEOUS
  Filled 2021-03-21 (×2): qty 0.16

## 2021-03-21 MED ORDER — HEPARIN SODIUM (PORCINE) 5000 UNIT/ML IJ SOLN
5000.0000 [IU] | Freq: Three times a day (TID) | INTRAMUSCULAR | Status: DC
  Administered 2021-03-22 (×2): 5000 [IU] via SUBCUTANEOUS
  Filled 2021-03-21 (×2): qty 1

## 2021-03-21 MED ORDER — METOCLOPRAMIDE HCL 5 MG/ML IJ SOLN
10.0000 mg | Freq: Once | INTRAMUSCULAR | Status: AC
  Administered 2021-03-21: 10 mg via INTRAVENOUS
  Filled 2021-03-21: qty 2

## 2021-03-21 MED ORDER — ACETAMINOPHEN 325 MG PO TABS: 650.0000 mg | ORAL_TABLET | Freq: Four times a day (QID) | ORAL | Status: DC | PRN

## 2021-03-21 MED ORDER — LACTATED RINGERS IV SOLN: INTRAVENOUS | Status: DC

## 2021-03-21 MED ORDER — ACETAMINOPHEN 650 MG RE SUPP: 650.0000 mg | Freq: Four times a day (QID) | RECTAL | Status: DC | PRN

## 2021-03-21 MED ORDER — SODIUM CHLORIDE 0.9 % IV SOLN
25.0000 mg | Freq: Once | INTRAVENOUS | Status: AC
  Administered 2021-03-21: 25 mg via INTRAVENOUS
  Filled 2021-03-21: qty 1

## 2021-03-21 MED ORDER — SODIUM CHLORIDE 0.9 % IV BOLUS
1000.0000 mL | Freq: Once | INTRAVENOUS | Status: AC
  Administered 2021-03-21: 1000 mL via INTRAVENOUS

## 2021-03-21 MED ORDER — HYDROMORPHONE HCL 1 MG/ML IJ SOLN
0.5000 mg | Freq: Once | INTRAMUSCULAR | Status: DC
  Filled 2021-03-21: qty 1

## 2021-03-21 NOTE — ED Triage Notes (Signed)
Pt BIB [facility] from home d/t n/v. Not been able eat or taking meds fro the past few days d/t severe vomiting. Daily dialysis at home. A&O X4.  ? ?Bp 174/108 ?Cbg 206 ?

## 2021-03-21 NOTE — Hospital Course (Signed)
Promethazine supp and 5mg  1-2 tabs of reglan.  ? ?These episodes happen 1 x a month.  ? ?Past 2 days having N/V. No blood. Multiple times today. Last time when first in ED. ?No diarrhea or constipation. NO HA, no fevers chills, SOB, no sick contacts.  ? ?Not able to keep food down. Last had a meal Thursday.  ? ?Had insulin yesterday but not today.  ? ?BG 224.  ?Mostly in the mid 200s.  ? ?Oliguric. Last urinated  ?Had PD last night ?Been on the same amount of Reglan for awhile. No trouble getting her medications.  ?Having bowel movements.  ? ?Medications:  ? ? ? ?PMH:  ? ? ?PSH:  ? ? ?SH: ?Lives with son and BF, ?No smoking ?No drinking ?No drugs, no marijuana ? ?Shower and cook by herself.  ? ?FH:  ? ? ? ?

## 2021-03-21 NOTE — ED Provider Notes (Signed)
?[place] ?Provider Note ? ? ?CSN: [id number] ?Arrival date & time: 03/21/21  1536 ? ?  ? ?History ? ?CC: Abdominal pain, n/v ? ? ?Barbara Donovan is a 31 y.o. female with history of end-stage renal disease on peritoneal dialysis, diabetes on insulin, gastroparesis, presented ED with abdominal pain, nausea and vomiting.  Patient reports this symptom became severe about 2 days ago.  She has tried home Phenergan suppositories but was not able to keep anything down afterwards.  She said her blood sugars are ranging 180-200 the past 2 days.  She unfortunately has had multiple hospitalizations for similar presentation for gastroparesis, most recently hospitalized for 2 days in February 6 to February 8 of this year. ? ?She is pending evaluation for kidney transplant as an outpatient. ? ?She denies fevers, cough, congestion, or upper respiratory symptoms ? ?Follows with IM resident service here ? ?HPI ? ?  ? ?Home Medications ?Prior to Admission medications   ?Medication Sig Start Date End Date Taking? Authorizing Provider  ?acetaminophen (TYLENOL) 500 MG tablet Take 1,000 mg by mouth every 6 (six) hours as needed for mild pain.    [provider]  ?amLODipine (NORVASC) 10 MG tablet Take 1 tablet (10 mg total) by mouth daily. 03/09/20   Mercy Riding, MD  ?calcitRIOL (ROCALTROL) 0.5 MCG capsule Take 0.5 mcg by mouth daily. 12/02/20   [provider]  ?carvedilol (COREG) 25 MG tablet Take 1 tablet (25 mg total) by mouth 2 (two) times daily. 03/07/19   Imogene Burn, PA-C  ?Continuous Blood Gluc Sensor MISC 1 each by Does not apply route as directed. Use as directed every 14 days. May dispense FreeStyle [employer] or similar. 01/27/20   Antonieta Pert, MD  ?gentamicin cream (GARAMYCIN) 0.1 % Apply 1 application topically daily. 09/10/20   [provider]  ?glucose blood (FREESTYLE TEST STRIPS) test strip Use as instructed 02/25/21   Terrilee Croak, MD   ?glucose blood (ONETOUCH VERIO) test strip 1 each by Other route 3 (three) times daily. And lancets 3/day 11/28/20   Renato Shin, MD  ?heparin sodium, porcine, 1000 UNIT/ML injection Inject 3 mLs into the vein daily. 01/20/21   [provider]  ?hydrALAZINE (APRESOLINE) 50 MG tablet Take 1 tablet (50 mg total) by mouth 3 (three) times daily. 12/31/20   Domenic Polite, MD  ?insulin glargine (LANTUS SOLOSTAR) 100 UNIT/ML Solostar Pen Inject 20 Units into the skin every morning. 02/05/21   Bonnielee Haff, MD  ?insulin lispro (HUMALOG KWIKPEN) 100 UNIT/ML KwikPen Inject 3 Units into the skin 3 (three) times daily with meals. 11/28/20   Renato Shin, MD  ?metoCLOPramide (REGLAN) 5 MG tablet Take 1 tablet (5 mg total) by mouth 3 (three) times daily before meals. 02/23/21   Sherwood Gambler, MD  ?mirtazapine (REMERON) 7.5 MG tablet Take 1 tablet (7.5 mg total) by mouth at bedtime. 02/05/21   Bonnielee Haff, MD  ?multivitamin (RENA-VIT) TABS tablet Take 1 tablet by mouth at bedtime. 03/08/20   Mercy Riding, MD  ?promethazine (PHENERGAN) 25 MG suppository Place 1 suppository (25 mg total) rectally every 6 (six) hours as needed for nausea or vomiting. 02/19/21   Margarita Mail, PA-C  ?sevelamer carbonate (RENVELA) 800 MG tablet Take 1 tablet (800 mg total) by mouth 3 (three) times daily with meals. 02/05/21   Bonnielee Haff, MD  ?   ? ?Allergies    ?Patient has no known allergies.   ? ?Review of Systems   ?  Review of Systems ? ?Physical Exam ?Updated Vital Signs ?There were no vitals taken for this visit. ?Physical Exam ?Constitutional:   ?   General: She is not in acute distress. ?HENT:  ?   Head: Normocephalic and atraumatic.  ?Eyes:  ?   Conjunctiva/sclera: Conjunctivae normal.  ?   Pupils: Pupils are equal, round, and reactive to light.  ?Cardiovascular:  ?   Rate and Rhythm: Regular rhythm. Tachycardia present.  ?Pulmonary:  ?   Effort: Pulmonary effort is normal. No respiratory distress.  ?Abdominal:  ?    Tenderness: There is abdominal tenderness. There is no guarding or rebound.  ?Skin: ?   General: Skin is warm and dry.  ?Neurological:  ?   General: No focal deficit present.  ?   Mental Status: She is alert. Mental status is at baseline.  ?Psychiatric:     ?   Mood and Affect: Mood normal.     ?   Behavior: Behavior normal.  ? ? ?ED Results / Procedures / Treatments   ?Labs ?(all labs ordered are listed, but only abnormal results are displayed) ?Labs Reviewed - No data to display ? ?EKG ?None ? ?Radiology ?No results found. ? ?Procedures ?Procedures  ? ? ?Medications Ordered in ED ?Medications - No data to display ? ?ED Course/ Medical Decision Making/ A&P ?Clinical Course as of 03/22/21 1009  ?Sat Mar 21, 2021  ?1751 Anion gap(!): 22 [MT]  ?1814 Pt still nauseated, will check Qtc and give additional nausea medications.  Anticipate high likelihood of needing admission given the prolonged length of her episodes in the past. [MT]  ?2038 Admitted to IM team [MT]  ?  ?Clinical Course User Index ?[MT] Wyvonnia Dusky, MD  ? ?                        ?Medical Decision Making ?Amount and/or Complexity of Data Reviewed ?Labs: ordered. Decision-making details documented in ED Course. ?ECG/medicine tests: ordered. ? ?Risk ?Prescription drug management. ?Decision regarding hospitalization. ? ? ?This patient presents to the ED with concern for nausea, vomiting, abdominal pain.  This involves an extensive number of treatment options, and is a complaint that carries with it a high risk of complications and morbidity.  The differential diagnosis includes gastroparesis most likely given the patient's cyclical history with this. ? ?Less likely SBP, acute biliary disease, ovarian torsion, or other intra-abdominal emergency based on this presentation. ? ?Co-morbidities that complicate the patient evaluation: End-stage renal disease on peritoneal dialysis as well as diabetes on insulin ? ?External records from outside source obtained  and reviewed including hospitalization course from February 2020 ? ?I ordered and personally interpreted labs.  The pertinent results include:  anion gap 22, no acidosis ? ? ?The patient was maintained on a cardiac monitor.  I personally viewed and interpreted the cardiac monitored which showed an underlying rhythm of: sinus rhythm/tachycardia ? ?Per my interpretation the patient's ECG shows NSR, Qtc wnl ? ?I ordered medication including IV fluids, IV pain medications, IV antiemetics for suspected gastroparesis ? ?I have reviewed the patients home medicines and have made adjustments as needed ? ?Test Considered:  ?- lower suspicion for acute intraabdominal inflammatory process- suspect gastroparesis as cause of vomiting - did not feel CT abdomen indicated ? ?After the interventions noted above, I reevaluated the patient and found that they have: stayed the same - still nauseated/vomiting after antiemetics ? ? ?Dispostion: ? ?After consideration of the diagnostic results and  the patients response to treatment, I feel that the patent would benefit from medical admission. ? ? ? ? ? ? ? ? ?Final Clinical Impression(s) / ED Diagnoses ?Gastroparesis ?Elevated anion gap ?Vomiting ? ? ?Rx / DC Orders ?ED Discharge Orders   ? ? None  ? ?  ? ? ?  ?Wyvonnia Dusky, MD ?03/22/21 1011 ? ?

## 2021-03-21 NOTE — H&P (Addendum)
Date: 03/21/2021               Patient Name:  Barbara Donovan MRN: [id number]  DOB: [birth year] Age / Sex: 31 y.o., female   PCP: Scarlett Presto, MD         Medical Service: Internal Medicine Teaching Service         Attending Physician: Dr. Heber St. Robert    First Contact: Scarlett Presto, MD Pager: AD [phone]  Second Contact: Barbara Axe, MD Pager: Sandrea Hammond [phone]       After Hours (After 5p/  First Contact Pager: [phone]  weekends / holidays): Second Contact Pager: [phone]    Chief Complaint: intractable nausea and vomiting   History of Present Illness:  [name] is a 31 year old female with a history of T1DM, ESRD on PD since May 2022, HTN, and frequent hospitalizations for intractable N/V (12 visits within the past 6 months requiring tx with IV Reglan q6h and IVF) presenting to the ED today d/t intractable N/V and poor PO intake x 2 days. Patient reports taking Reglan yesterday morning but was unable to keep it down, and trying Phenergan suppositories without relief. Has not been able to tolerate any medications today and reports not taking insulin today as she has not been eating well. Reports excellent insulin compliance. Home CBG's in 200's. She has no trouble accessing her medications.   Denies fevers, chills, cough, chest pain, abdominal pain, SHOB, dark stools, bloody stools, or urinary symptoms. No recent sick contacts. Continues to produce some urine. Last PD session was on the night of 3/3.  ED course: Vital on arrival: BP 187/106, HR 109, RR 16, and saturating at 100% on RA. Temp 100.3 F. BMP with anion gap 22, glucose 200, bicarb 17, renal function at baseline. I stat K 5.4 with BMP K 3.4. Magnesium 2.4. CBC wnl. Lipase 47. BHA pending. Beta hCG negative. UA pending. COVID and Influenza negative.   Received IV Reglan 10 mg and Phenergan 25 mg by EDP. [facility] called for admission. Pt reports no ongoing N/V at this time.   Meds:  No current facility-administered medications on file  prior to encounter.   Current Outpatient Medications on File Prior to Encounter  Medication Sig Dispense Refill   acetaminophen (TYLENOL) 500 MG tablet Take 1,000 mg by mouth every 6 (six) hours as needed for mild pain.     amLODipine (NORVASC) 10 MG tablet Take 1 tablet (10 mg total) by mouth daily. 90 tablet 1   brimonidine (ALPHAGAN) 0.2 % ophthalmic solution Place 1 drop into the left eye 3 (three) times daily.     calcitRIOL (ROCALTROL) 0.5 MCG capsule Take 0.5 mcg by mouth daily.     carvedilol (COREG) 25 MG tablet Take 1 tablet (25 mg total) by mouth 2 (two) times daily. 180 tablet 3   FLUoxetine (PROZAC) 20 MG capsule Take 20 mg by mouth daily.     gentamicin cream (GARAMYCIN) 0.1 % Apply 1 application topically daily.     glucose blood (ONETOUCH VERIO) test strip 1 each by Other route 3 (three) times daily. And lancets 3/day (Patient taking differently: 1 each by Other route 4 (four) times daily -  before meals and at bedtime.) 300 each 3   heparin sodium, porcine, 1000 UNIT/ML injection Inject 3 mLs into the vein daily.     hydrALAZINE (APRESOLINE) 50 MG tablet Take 1 tablet (50 mg total) by mouth 3 (three) times daily. (Patient taking differently: Take 50 mg by mouth in  the morning and at bedtime.)     insulin glargine (LANTUS SOLOSTAR) 100 UNIT/ML Solostar Pen Inject 20 Units into the skin every morning. 15 mL 2   insulin lispro (HUMALOG KWIKPEN) 100 UNIT/ML KwikPen Inject 3 Units into the skin 3 (three) times daily with meals. 15 mL 3   metoCLOPramide (REGLAN) 5 MG tablet Take 1 tablet (5 mg total) by mouth 3 (three) times daily before meals. 90 tablet 0   mirtazapine (REMERON) 7.5 MG tablet Take 1 tablet (7.5 mg total) by mouth at bedtime. 30 tablet 1   multivitamin (RENA-VIT) TABS tablet Take 1 tablet by mouth at bedtime. 90 tablet 1   promethazine (PHENERGAN) 25 MG suppository Place 1 suppository (25 mg total) rectally every 6 (six) hours as needed for nausea or vomiting. 10  suppository 0   sevelamer carbonate (RENVELA) 800 MG tablet Take 1 tablet (800 mg total) by mouth 3 (three) times daily with meals. 90 tablet 1   Continuous Blood Gluc Sensor MISC 1 each by Does not apply route as directed. Use as directed every 14 days. May dispense FreeStyle [employer] or similar. (Patient not taking: Reported on 03/21/2021) 1 each 0   glucose blood (FREESTYLE TEST STRIPS) test strip Use as instructed (Patient not taking: Reported on 03/21/2021) 100 each 0    Allergies: Allergies as of 03/21/2021   (No Known Allergies)   Past Medical History:  Diagnosis Date   Asthma    as a child, no problems as an adult, no inhaler   Cataract    NS OU   Chronic hypertension during pregnancy, antepartum 08/19/2017   Dehydration 01/28/2018   Depression during pregnancy, antepartum 07/07/2017   6/20: Short trial of zoloft previously, reports didn't help much but also didn't give it a chance Discussed r/b/a SSRIs in pregnancy, agrees to try Zoloft again, rx sent No SI/HI/red flags   Diabetes ([place])    TYPE I. A1C 7.5% 05/31/20   Diabetic retinopathy ([place]) 06/09/2017   07/2017 with bilateral severe diabetic non-proliferative retinopathy with macular edema.   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 A2/B GDM)  Required Referrals for A1GDM or A2GDM: [x]  Diabetes Education and Testing Supplies [x]  Nutrition Cousult  For A2/B GDM or higher classes of DM [x]  Diabetes Education and Testing Supplies [x]  Nutrition Counsult [x]  Fetal  ECHO after 20 weeks  [x]  Eye exam for retina evaluation - severe retinopathy 7/19  Base   Ventricular septal defect (VSD) of fetus in singleton pregnancy, antepartum 09/30/2017   May go to newborn nursery per Dr. Lenard Simmer Echo prior to discharge    Family History:  Mother- DM, seizures  Father- DM, COPD, MI  Maternal grandfather- stroke   Social History:   Lives with son and boyfriend in [place] Denies alcohol, tobacco, or illicit drug use.  IADLs/ADLs- can person independently at baseline   Review of Systems: A complete ROS was negative except as per HPI.    Physical Exam: Blood pressure (!) 162/94, pulse (!) 111, temperature 100.3 F (  37.9 C), temperature source Oral, resp. rate 17, SpO2 100 %. 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 She follows Dr Fuller Plan with [facility]; studies including RUQ [name] (11/2019), CT A/P (01/2020), and EGD 01/2020 have been unveiling with pathology negative for H. pylori, and normal gastric emptying study in 05/2020 excludes gastroparesis, but symptoms mostly under control with Reglan 5 mg QID. Dr Lynne Leader 08/2020 note mentions that if control of DM does not improve her N/V, he recommends GI evaluation at a tertiary center, though she has since followed up with him and reported feeling better. Many recent ED visits  and hospital admissions, most recent being from 2/6-2/8, for ongoing N/V. During previous 2 admissions, symptoms were controlled with IV Reglan 10 mg Q6h, and discharge within 1-2 days. Given A1c not very reliable in ESRD, and home CBG readings in mid 200's, suspect this is 2/2 worsening gastroparesis from uncontrolled DM. Most recent A1c 6.9 on 01/21/21, A1c's previously ranging from 8-12 prior to ESRD state in 05/2019, and was as high as 8.8 this past October. Do not suspect cannabis hyperemesis syndrome given she reports not using cannabis, and previous UA's negative for THC. Continues to produce some urine; UA pending. Currently feeling well after receiving IV Reglan 10 mg and Phenergan in the ED.  - Continue with scheduled Reglan 10 mg q6h - LR 100 cc/hr for 10 hours  - Clear liquid diet for now, and ADAT - Consider GI consult in AM  - Tylenol prn  - TSH - CBC  - CMP   - Repeat EKG in AM  Type 1 diabetes mellitus  Increased anion gap metabolic acidosis Anion gap of 22 -> 18, bicarb 17 -> 19, and glucose 200 -> 121. BHA only mildly elevated at 0.54. Suspect this is from starvation ketoacidosis and ESRD state, rather than euglycemic DKA. Urine pending for ketones.  Most recent A1c 6.9 on 01/21/21, home CBG's in mid-200's. Follows with [facility], and is currently on Lantus 20U nightly, and Humalog 3U TID prn.  - Semglee 16 units nightly (home dose 20 units)  - LR 100cc/hr for 10 hours  - Follow up on ketones  - Trend BMP  - CBG monitoring   Low-grade fever  Temp of 100.3 on admission. Pt denies fevers, chills, and recent sick contacts. COVID and Influenza negative. CBC with WBC 6.5. UA pending. Do not suspect she has an infection at this time. Will continue to monitor and continue to eval for systemic s/sxs.   Hypokalemia I-stat K 5.4, but 3.4 on BMP. Mag 2.4.  - Trend BMP    ESRD on PD On PD since 05/2020. Last completed PD this morning. Per EDP and previous admissions, no indication  for urgent PD tonight.  Currently undergoing kidney transplant evaluation of [facility].  - Contact nephrology in AM for PD if pt requires continued observation   - Gentle fluids as above  HTN Hypertensive on admission with BP 187/106 and repeat 133/89 a couple hours later. Home regimen includes: amlodipine 10 mg daily, Coreg 25 mg BID, and Hydralazine 50 TID; did not keep meds down yesterday and did not take today.  - Labetalol 20 mg IV q3h prn   Depression  - Holding Remeron  Best Practice: Diet: Clear liquids and ADAT IVF: LR,100cc/hr for 10 hours  VTE: Heparin Code: Full   Lajean Manes, MD  Internal Medicine Resident, PGY-1 Pager: [phone] 11:56 PM, 03/21/2021

## 2021-03-22 DIAGNOSIS — Z20822 Contact with and (suspected) exposure to covid-19: Secondary | ICD-10-CM | POA: Diagnosis not present

## 2021-03-22 DIAGNOSIS — Z794 Long term (current) use of insulin: Secondary | ICD-10-CM | POA: Diagnosis not present

## 2021-03-22 DIAGNOSIS — E1043 Type 1 diabetes mellitus with diabetic autonomic (poly)neuropathy: Secondary | ICD-10-CM | POA: Diagnosis present

## 2021-03-22 DIAGNOSIS — N186 End stage renal disease: Secondary | ICD-10-CM | POA: Diagnosis not present

## 2021-03-22 DIAGNOSIS — E1022 Type 1 diabetes mellitus with diabetic chronic kidney disease: Secondary | ICD-10-CM | POA: Diagnosis not present

## 2021-03-22 DIAGNOSIS — Z823 Family history of stroke: Secondary | ICD-10-CM | POA: Diagnosis not present

## 2021-03-22 DIAGNOSIS — Z8249 Family history of ischemic heart disease and other diseases of the circulatory system: Secondary | ICD-10-CM | POA: Diagnosis not present

## 2021-03-22 DIAGNOSIS — E872 Acidosis, unspecified: Secondary | ICD-10-CM | POA: Diagnosis present

## 2021-03-22 DIAGNOSIS — D649 Anemia, unspecified: Secondary | ICD-10-CM | POA: Diagnosis present

## 2021-03-22 DIAGNOSIS — Z992 Dependence on renal dialysis: Secondary | ICD-10-CM

## 2021-03-22 DIAGNOSIS — J45909 Unspecified asthma, uncomplicated: Secondary | ICD-10-CM | POA: Diagnosis present

## 2021-03-22 DIAGNOSIS — R109 Unspecified abdominal pain: Secondary | ICD-10-CM | POA: Diagnosis present

## 2021-03-22 DIAGNOSIS — E10319 Type 1 diabetes mellitus with unspecified diabetic retinopathy without macular edema: Secondary | ICD-10-CM | POA: Diagnosis present

## 2021-03-22 DIAGNOSIS — Z825 Family history of asthma and other chronic lower respiratory diseases: Secondary | ICD-10-CM | POA: Diagnosis not present

## 2021-03-22 DIAGNOSIS — K3184 Gastroparesis: Secondary | ICD-10-CM

## 2021-03-22 DIAGNOSIS — R112 Nausea with vomiting, unspecified: Secondary | ICD-10-CM

## 2021-03-22 DIAGNOSIS — I120 Hypertensive chronic kidney disease with stage 5 chronic kidney disease or end stage renal disease: Secondary | ICD-10-CM | POA: Diagnosis not present

## 2021-03-22 DIAGNOSIS — Z79899 Other long term (current) drug therapy: Secondary | ICD-10-CM | POA: Diagnosis not present

## 2021-03-22 DIAGNOSIS — F32A Depression, unspecified: Secondary | ICD-10-CM

## 2021-03-22 DIAGNOSIS — Z833 Family history of diabetes mellitus: Secondary | ICD-10-CM | POA: Diagnosis not present

## 2021-03-22 DIAGNOSIS — E1065 Type 1 diabetes mellitus with hyperglycemia: Secondary | ICD-10-CM | POA: Diagnosis not present

## 2021-03-22 LAB — CBC
HCT: 30.7 % — ABNORMAL LOW (ref 36.0–46.0)
Hemoglobin: 10 g/dL — ABNORMAL LOW (ref 12.0–15.0)
MCH: 27.9 pg (ref 26.0–34.0)
MCHC: 32.6 g/dL (ref 30.0–36.0)
MCV: 85.5 fL (ref 80.0–100.0)
Platelets: 305 10*3/uL (ref 150–400)
RBC: 3.59 MIL/uL — ABNORMAL LOW (ref 3.87–5.11)
RDW: 15.4 % (ref 11.5–15.5)
WBC: 5.4 10*3/uL (ref 4.0–10.5)
nRBC: 0 % (ref 0.0–0.2)

## 2021-03-22 LAB — COMPREHENSIVE METABOLIC PANEL
ALT: 13 U/L (ref 0–44)
AST: 12 U/L — ABNORMAL LOW (ref 15–41)
Albumin: 2.9 g/dL — ABNORMAL LOW (ref 3.5–5.0)
Alkaline Phosphatase: 45 U/L (ref 38–126)
Anion gap: 16 — ABNORMAL HIGH (ref 5–15)
BUN: 57 mg/dL — ABNORMAL HIGH (ref 6–20)
CO2: 20 mmol/L — ABNORMAL LOW (ref 22–32)
Calcium: 8.8 mg/dL — ABNORMAL LOW (ref 8.9–10.3)
Chloride: 101 mmol/L (ref 98–111)
Creatinine, Ser: 10.6 mg/dL — ABNORMAL HIGH (ref 0.44–1.00)
GFR, Estimated: 5 mL/min — ABNORMAL LOW (ref >60)
Glucose, Bld: 135 mg/dL — ABNORMAL HIGH (ref 70–99)
Potassium: 3.5 mmol/L (ref 3.5–5.1)
Sodium: 137 mmol/L (ref 135–145)
Total Bilirubin: 0.2 mg/dL — ABNORMAL LOW (ref 0.3–1.2)
Total Protein: 6.1 g/dL — ABNORMAL LOW (ref 6.5–8.1)

## 2021-03-22 LAB — GLUCOSE, CAPILLARY
Glucose-Capillary: 117 mg/dL — ABNORMAL HIGH (ref 70–99)
Glucose-Capillary: 117 mg/dL — ABNORMAL HIGH (ref 70–99)
Glucose-Capillary: 118 mg/dL — ABNORMAL HIGH (ref 70–99)
Glucose-Capillary: 173 mg/dL — ABNORMAL HIGH (ref 70–99)
Glucose-Capillary: 170 mg/dL — ABNORMAL HIGH (ref 70–99)

## 2021-03-22 LAB — BETA-HYDROXYBUTYRIC ACID: Beta-Hydroxybutyric Acid: 0.39 mmol/L — ABNORMAL HIGH (ref 0.05–0.27)

## 2021-03-22 MED ORDER — SEVELAMER CARBONATE 800 MG PO TABS
800.0000 mg | ORAL_TABLET | Freq: Three times a day (TID) | ORAL | Status: DC
  Administered 2021-03-22: 800 mg via ORAL
  Filled 2021-03-22: qty 1

## 2021-03-22 MED ORDER — SODIUM CHLORIDE 0.9 % IV SOLN
25.0000 mg | Freq: Once | INTRAVENOUS | Status: AC
  Administered 2021-03-22: 25 mg via INTRAVENOUS
  Filled 2021-03-22: qty 1

## 2021-03-22 MED ORDER — PROMETHAZINE HCL 25 MG RE SUPP
25.0000 mg | Freq: Four times a day (QID) | RECTAL | Status: DC | PRN
  Filled 2021-03-22: qty 1

## 2021-03-22 MED ORDER — CARVEDILOL 25 MG PO TABS
25.0000 mg | ORAL_TABLET | Freq: Two times a day (BID) | ORAL | Status: DC
  Administered 2021-03-22: 25 mg via ORAL
  Filled 2021-03-22: qty 1

## 2021-03-22 MED ORDER — DELFLEX-LC/1.5% DEXTROSE 344 MOSM/L IP SOLN: INTRAPERITONEAL | Status: DC

## 2021-03-22 MED ORDER — GENTAMICIN SULFATE 0.1 % EX CREA
1.0000 "application " | TOPICAL_CREAM | Freq: Every day | CUTANEOUS | Status: DC
  Administered 2021-03-22: 1 via TOPICAL
  Filled 2021-03-22: qty 15

## 2021-03-22 MED ORDER — HYDRALAZINE HCL 50 MG PO TABS
50.0000 mg | ORAL_TABLET | Freq: Three times a day (TID) | ORAL | Status: DC
  Administered 2021-03-22: 50 mg via ORAL
  Filled 2021-03-22: qty 1

## 2021-03-22 MED ORDER — CALCITRIOL 0.25 MCG PO CAPS
0.5000 ug | ORAL_CAPSULE | Freq: Every day | ORAL | Status: DC
  Administered 2021-03-22: 0.5 ug via ORAL
  Filled 2021-03-22: qty 2

## 2021-03-22 MED ORDER — INSULIN ASPART 100 UNIT/ML IJ SOLN
0.0000 [IU] | Freq: Three times a day (TID) | INTRAMUSCULAR | Status: DC
  Administered 2021-03-22: 1 [IU] via SUBCUTANEOUS

## 2021-03-22 MED ORDER — MIRTAZAPINE 15 MG PO TABS: 7.5000 mg | ORAL_TABLET | Freq: Every day | ORAL | Status: DC

## 2021-03-22 MED ORDER — AMLODIPINE BESYLATE 10 MG PO TABS
10.0000 mg | ORAL_TABLET | Freq: Every day | ORAL | Status: DC
  Administered 2021-03-22: 10 mg via ORAL
  Filled 2021-03-22: qty 1

## 2021-03-22 MED ORDER — INSULIN ASPART 100 UNIT/ML IJ SOLN
2.0000 [IU] | Freq: Three times a day (TID) | INTRAMUSCULAR | Status: DC
  Administered 2021-03-22: 2 [IU] via SUBCUTANEOUS

## 2021-03-22 MED ORDER — FLUOXETINE HCL 20 MG PO CAPS
20.0000 mg | ORAL_CAPSULE | Freq: Every day | ORAL | Status: DC
  Administered 2021-03-22: 20 mg via ORAL
  Filled 2021-03-22: qty 1

## 2021-03-22 NOTE — Care Management (Signed)
?  Transition of Care (TOC) Screening Note ? ? ?Patient Details  ?Name: Barbara Donovan ?Date of Birth: 1990-11-17 ? ? ?Transition of Care (TOC) CM/SW Contact:    ?Carles Collet, RN ?Phone Number: ?03/22/2021, 4:04 PM ? ? ? ?Transition of Care Department [facility]) has reviewed patient and we will continue to monitor patient advancement through interdisciplinary progression rounds. I  ?

## 2021-03-22 NOTE — Progress Notes (Signed)
? ? ?HD#0 ?Subjective:  ?Overnight Events: NAEO ? ? Patient says she is starting to feel better. She feels like she is on day two of this and her episodes normally last a couple of days. Will try and eat today. ? ?Objective:  ?Vital signs in last 24 hours: ?Vitals:  ? 03/21/21 2245 03/22/21 0227 03/22/21 0352 03/22/21 0736  ?BP: (!) 162/94 (!) 173/108 122/83 (!) 147/96  ?Pulse: (!) 111 96  99  ?Resp: 17 14 14 14   ?Temp:  98.2 ?F (36.8 ?C)  98.1 ?F (36.7 ?C)  ?TempSrc:  Oral  Oral  ?SpO2: 100% 99%  98%  ? ?Supplemental O2: Room Air ?SpO2: 98 % ? ? ?Physical Exam:  ?Constitutional: young woman resting comfortably in bed, in no acute distress ?HEENT: normocephalic atraumatic, mucous membranes moist, conjunctiva non-erythematous ?Cardiovascular: regular rate and rhythm, no m/r/g ?Pulmonary/Chest: normal work of breathing on room air, lungs clear to auscultation bilaterally ?Abdominal: soft, non-tender, non-distended, normal bowel sounds ?MSK: normal bulk and tone ?Neurological: alert & oriented x 3, answering questions appropriately ?Skin: warm and dry ?Psych: normal affect ? ?There were no vitals filed for this visit. ? ? ?Intake/Output Summary (Last 24 hours) at 03/22/2021 1114 ?Last data filed at 03/22/2021 0231 ?Gross per 24 hour  ?Intake 240 ml  ?Output --  ?Net 240 ml  ? ?Net IO Since Admission: 240 mL [03/22/21 1114] ? ?Pertinent Labs: ?CBC Latest Ref Rng & Units 03/22/2021 03/21/2021 03/21/2021  ?WBC 4.0 - 10.5 K/uL 5.4 - 6.5  ?Hemoglobin 12.0 - 15.0 g/dL 10.0(L) 13.3 12.4  ?Hematocrit 36.0 - 46.0 % 30.7(L) 39.0 38.6  ?Platelets 150 - 400 K/uL 305 - 387  ? ? ?CMP Latest Ref Rng & Units 03/22/2021 03/21/2021 03/21/2021  ?Glucose 70 - 99 mg/dL 135(H) 121(H) -  ?BUN 6 - 20 mg/dL 57(H) 56(H) -  ?Creatinine 0.44 - 1.00 mg/dL 10.60(H) 9.82(H) -  ?Sodium 135 - 145 mmol/L 137 139 134(L)  ?Potassium 3.5 - 5.1 mmol/L 3.5 3.4(L) 5.4(H)  ?Chloride 98 - 111 mmol/L 101 102 -  ?CO2 22 - 32 mmol/L 20(L) 19(L) -  ?Calcium 8.9 - 10.3 mg/dL  8.8(L) 9.1 -  ?Total Protein 6.5 - 8.1 g/dL 6.1(L) 6.3(L) -  ?Total Bilirubin 0.3 - 1.2 mg/dL 0.2(L) 0.4 -  ?Alkaline Phos 38 - 126 U/L 45 44 -  ?AST 15 - 41 U/L 12(L) 11(L) -  ?ALT 0 - 44 U/L 13 11 -  ? ? ?Imaging: ?No results found. ? ?Assessment/Plan:  ? ?Principal Problem: ?  Intractable nausea and vomiting ?Active Problems: ?  Chronic hypertension ?  Type 1 diabetes mellitus with hyperglycemia (HCC) ?  ESRD (end stage renal disease) on PD ?  Increased anion gap metabolic acidosis ?  Gastroparesis ? ? ?Patient Summary: ?Barbara Donovan is a 31 y.o. with a pertinent PMH of T1DM, ESRD on PD since May 2022, HTN, and frequent hospitalizations for intractable N/V (12 visits within the past 6 months requiring tx with IV Reglan q6h and IVF) admitted for acute on chronic NV. ? ?Acute on chronic intractable N/V  ?Patient says she is starting to feel better today. Her episodes usually last a couple of days and she says it feels like she is on day two. She is going to try and see if she is able to eat breakfast this morning and then advance diet. QTC stable on EKG. ?- Continue with scheduled Reglan 10 mg q6h ?- Clear liquid diet for now, and ADAT ?-  Tylenol prn  ?- CBC  ?- CMP   ?  ?Type 1 diabetes mellitus  ?Increased anion gap metabolic acidosis ?Anion gap of 22 -> 18, bicarb 17 -> 19, and glucose 200 -> 121. BHA only mildly elevated at 0.54. Suspect this is from starvation ketoacidosis and ESRD state, rather than euglycemic DKA. Urine pending for ketones.  ?Most recent A1c 6.9 on 01/21/21, home CBG's in mid-200's. Follows with [facility], and is currently on Lantus 20U nightly, and Humalog 3U TID prn.  ?- Semglee 16 units nightly (home dose 20 units)  ?- Trend BMP  ?- CBG monitoring  ?  ?Low-grade fever  ?Temp of 100.3 on admission. Pt denies fevers, chills, and recent sick contacts. COVID and Influenza negative. CBC with WBC 6.5. Repeat temps have been normal.  ? ?ESRD on PD ?On PD since 05/2020. Last completed PD  this morning. Per EDP and previous admissions, no indication for urgent PD tonight.  ?Currently undergoing kidney transplant evaluation of [facility].  ?- Contact nephrology in AM for PD if pt requires continued observation   ?  ?HTN ?Hypertensive on admission with BP 187/106 and repeat 133/89 a couple hours later. Home regimen includes: amlodipine 10 mg daily, Coreg 25 mg BID, and Hydralazine 50 TID; did not keep meds down yesterday and did not take today.  ?- Labetalol 20 mg IV q3h prn  ?  ?Depression  ?- Holding Remeron ? ?Hypokalemia;resolved ?  ?Best Practice: ?Diet: Clear liquids and ADAT ?IVF: LR,100cc/hr for 10 hours  ?VTE: Heparin ?Code: Full ? ?Scarlett Presto, MD ?Internal Medicine Resident PGY-1 ?Pager [phone] ?Please contact the on call pager after 5 pm and on weekends at [phone]. ? ?

## 2021-03-22 NOTE — Consult Note (Signed)
EMERA BUSSIE Admit Date: 03/21/2021 03/22/2021 Rexene Agent Requesting Physician:  Ileene Musa MD  Reason for Consult:  ESRD on PD, N/V HPI:  16F ESRD on PD, DM1, gastroparesis with frequent admissions for N/V, HTN,  admitted overnight with recurrent N/V.  Today she feels somewhat better, tolerating some liquids.  Labs notable for BUN 57, K 3.5, HCO3 20 this AM.  BPs stable, on RA, afebrile at this time  No recent abd pain, cloudy effluent.  No problems draining or filling.  CCPD Rx 2L x5, dry day no pause, 37min dwell. EDW 63kg  ROS Balance of 12 systems is negative w/ exceptions as above  PMH  Past Medical History:  Diagnosis Date   Asthma    as a child, no problems as an adult, no inhaler   Cataract    NS OU   Chronic hypertension during pregnancy, antepartum 08/19/2017   Dehydration 01/28/2018   Depression during pregnancy, antepartum 07/07/2017   6/20: Short trial of zoloft previously, reports didn't help much but also didn't give it a chance Discussed r/b/a SSRIs in pregnancy, agrees to try Zoloft again, rx sent No SI/HI/red flags   Diabetes ([place])    TYPE I. A1C 7.5% 05/31/20   Diabetic retinopathy ([place]) 06/09/2017   07/2017 with bilateral severe diabetic non-proliferative retinopathy with macular edema.   ESRD on peritoneal dialysis ([place])    HTN (hypertension)    Hypertensive retinopathy    OU   Hypokalemia 01/22/2018   Hypomagnesemia 01/28/2018   Intractable nausea and vomiting 01/22/2018   Intrauterine growth restriction (IUGR) affecting care of mother 12/22/2017   Morbid obesity ([place])    Nephropathy, diabetic ([place]) 12/29/2017   Severe hyperemesis gravidarum 10/30/2017   Type I diabetes mellitus ([place]) 07/07/2017   Current Diabetic Medications:  Insulin  [x]  Aspirin 81 mg daily after 12 weeks (? A2/B GDM)  Required Referrals for A1GDM or A2GDM: [x]  Diabetes Education and Testing Supplies [x]  Nutrition Cousult  For A2/B GDM or higher classes of DM [x]  Diabetes  Education and Testing Supplies [x]  Nutrition Counsult [x]  Fetal ECHO after 20 weeks  [x]  Eye exam for retina evaluation - severe retinopathy 7/19  Base   Ventricular septal defect (VSD) of fetus in singleton pregnancy, antepartum 09/30/2017   May go to newborn nursery per Dr. Lenard Simmer Echo prior to discharge   [facility]  Past Surgical History:  Procedure Laterality Date   25 GAUGE PARS PLANA VITRECTOMY WITH 20 GAUGE MVR PORT FOR MACULAR HOLE Left 07/20/2018   Procedure: 25 GAUGE PARS PLANA VITRECTOMY LEFT EYE;  Surgeon: Bernarda Caffey, MD;  Location: [place];  Service: Ophthalmology;  Laterality: Left;   CAPD INSERTION N/A 09/10/2020   Procedure: LAPAROSCOPIC INSERTION CONTINUOUS AMBULATORY PERITONEAL DIALYSIS  (CAPD) CATHETER;  Surgeon: Serafina Mitchell, MD;  Location: [place];  Service: Vascular;  Laterality: N/A;   CESAREAN SECTION N/A 12/26/2017   Procedure: CESAREAN SECTION;  Surgeon: Osborne Oman, MD;  Location: [place];  Service: Obstetrics;  Laterality: N/A;   EYE EXAMINATION UNDER ANESTHESIA Right 07/20/2018   Procedure: Eye Exam Under Anesthesia RIGHT EYE;  Surgeon: Bernarda Caffey, MD;  Location: [place];  Service: Ophthalmology;  Laterality: Right;   EYE SURGERY Left 07/2018   GAS INSERTION Left 07/19/2019   Procedure: INSERTION OF GAS;  Surgeon: Bernarda Caffey, MD;  Location: [place];  Service: Ophthalmology;  Laterality: Left;  SF6   INJECTION OF SILICONE OIL Left 08/18/8297   Procedure: Injection Of Silicone Oil LEFT EYE;  Surgeon: Bernarda Caffey, MD;  Location: [place];  Service: Ophthalmology;  Laterality: Left;   IR FLUORO GUIDE CV LINE RIGHT  06/02/2020   IR US GUIDE VASC ACCESS RIGHT  06/02/2020   LASER PHOTO ABLATION Right 07/20/2018   Procedure: Laser Photo Ablation RIGHT EYE;  Surgeon: Bernarda Caffey, MD;  Location: [place];  Service: Ophthalmology;  Laterality: Right;   MEMBRANE PEEL Left 07/20/2018   Procedure: Membrane Peel LEFT EYE;  Surgeon: Bernarda Caffey, MD;  Location: [place];  Service:  Ophthalmology;  Laterality: Left;   MEMBRANE PEEL Left 07/19/2019   Procedure: MEMBRANE PEEL;  Surgeon: Bernarda Caffey, MD;  Location: [place];  Service: Ophthalmology;  Laterality: Left;   MITOMYCIN C APPLICATION Bilateral 08/26/2117   Procedure: Avastin Application;  Surgeon: Bernarda Caffey, MD;  Location: [place];  Service: Ophthalmology;  Laterality: Bilateral;   PHOTOCOAGULATION WITH LASER Left 07/20/2018   Procedure: Photocoagulation With Laser LEFT EYE;  Surgeon: Bernarda Caffey, MD;  Location: [place];  Service: Ophthalmology;  Laterality: Left;   PHOTOCOAGULATION WITH LASER Left 07/19/2019   Procedure: PHOTOCOAGULATION WITH LASER;  Surgeon: Bernarda Caffey, MD;  Location: [place];  Service: Ophthalmology;  Laterality: Left;   RETINAL DETACHMENT SURGERY Left 07/20/2018   Dr. Bernarda Caffey   SILICON OIL REMOVAL Left 07/19/2019   Procedure: 25g PARS PLANA VITRECTOMY WITH SILICON OIL REMOVAL;  Surgeon: Bernarda Caffey, MD;  Location: [place];  Service: Ophthalmology;  Laterality: Left;   WISDOM TOOTH EXTRACTION     FH  Family History  Problem Relation Age of Onset   Diabetes Mother    Aneurysm Mother 63   Seizures Mother    Diabetes Father    Cataracts Father    COPD Father    Heart attack Father    Heart disease Father    Healthy Sister    Healthy Daughter    Stroke Maternal Grandfather    Amblyopia Neg Hx    Blindness Neg Hx    Glaucoma Neg Hx    Macular degeneration Neg Hx    Retinal detachment Neg Hx    Strabismus Neg Hx    Retinitis pigmentosa Neg Hx    Colon cancer Neg Hx    Stomach cancer Neg Hx    Esophageal cancer Neg Hx    Pancreatic cancer Neg Hx    Liver disease Neg Hx    SH  reports that she has never smoked. She has never used smokeless tobacco. She reports that she does not currently use alcohol. She reports that she does not use drugs. Allergies No Known Allergies Home medications Prior to Admission medications   Medication Sig Start Date End Date Taking? Authorizing Provider   acetaminophen (TYLENOL) 500 MG tablet Take 1,000 mg by mouth every 6 (six) hours as needed for mild pain.   Yes [provider]  amLODipine (NORVASC) 10 MG tablet Take 1 tablet (10 mg total) by mouth daily. 03/09/20  Yes Mercy Riding, MD  brimonidine (ALPHAGAN) 0.2 % ophthalmic solution Place 1 drop into the left eye 3 (three) times daily. 03/09/21  Yes [provider]  calcitRIOL (ROCALTROL) 0.5 MCG capsule Take 0.5 mcg by mouth daily. 12/02/20  Yes [provider]  carvedilol (COREG) 25 MG tablet Take 1 tablet (25 mg total) by mouth 2 (two) times daily. 03/07/19  Yes Imogene Burn, PA-C  FLUoxetine (PROZAC) 20 MG capsule Take 20 mg by mouth daily. 03/16/21  Yes [provider]  gentamicin cream (GARAMYCIN) 0.1 %  Apply 1 application topically daily. 09/10/20  Yes [provider]  glucose blood (ONETOUCH VERIO) test strip 1 each by Other route 3 (three) times daily. And lancets 3/day Patient taking differently: 1 each by Other route 4 (four) times daily -  before meals and at bedtime. 11/28/20  Yes Renato Shin, MD  heparin sodium, porcine, 1000 UNIT/ML injection Inject 3 mLs into the vein daily. 01/20/21  Yes [provider]  hydrALAZINE (APRESOLINE) 50 MG tablet Take 1 tablet (50 mg total) by mouth 3 (three) times daily. Patient taking differently: Take 50 mg by mouth in the morning and at bedtime. 12/31/20  Yes Domenic Polite, MD  insulin glargine (LANTUS SOLOSTAR) 100 UNIT/ML Solostar Pen Inject 20 Units into the skin every morning. 02/05/21  Yes Bonnielee Haff, MD  insulin lispro (HUMALOG KWIKPEN) 100 UNIT/ML KwikPen Inject 3 Units into the skin 3 (three) times daily with meals. 11/28/20  Yes Renato Shin, MD  metoCLOPramide (REGLAN) 5 MG tablet Take 1 tablet (5 mg total) by mouth 3 (three) times daily before meals. 02/23/21  Yes Sherwood Gambler, MD  mirtazapine (REMERON) 7.5 MG tablet Take 1 tablet (7.5 mg total) by mouth at bedtime. 02/05/21   Yes Bonnielee Haff, MD  multivitamin (RENA-VIT) TABS tablet Take 1 tablet by mouth at bedtime. 03/08/20  Yes Mercy Riding, MD  promethazine (PHENERGAN) 25 MG suppository Place 1 suppository (25 mg total) rectally every 6 (six) hours as needed for nausea or vomiting. 02/19/21  Yes Harris, Abigail, PA-C  sevelamer carbonate (RENVELA) 800 MG tablet Take 1 tablet (800 mg total) by mouth 3 (three) times daily with meals. 02/05/21  Yes Bonnielee Haff, MD  Continuous Blood Gluc Sensor MISC 1 each by Does not apply route as directed. Use as directed every 14 days. May dispense FreeStyle [employer] or similar. Patient not taking: Reported on 03/21/2021 01/27/20   Antonieta Pert, MD  glucose blood (FREESTYLE TEST STRIPS) test strip Use as instructed Patient not taking: Reported on 03/21/2021 02/25/21   Terrilee Croak, MD    Current Medications Scheduled Meds:  gentamicin cream  1 application Topical Daily   heparin  5,000 Units Subcutaneous Q8H   insulin glargine-yfgn  16 Units Subcutaneous QHS   metoCLOPramide (REGLAN) injection  10 mg Intravenous Q6H   Continuous Infusions:  dialysis solution 1.5% low-MG/low-CA     PRN Meds:.acetaminophen **OR** acetaminophen, labetalol  CBC Recent Labs  Lab 03/21/21 1551 03/21/21 1656 03/22/21 0436  WBC 6.5  --  5.4  NEUTROABS 5.3  --   --   HGB 12.4 13.3 10.0*  HCT 38.6 39.0 30.7*  MCV 87.1  --  85.5  PLT 387  --  528   Basic Metabolic Panel Recent Labs  Lab 03/21/21 1551 03/21/21 1656 03/21/21 2103 03/22/21 0436  NA 137 134* 139 137  K 4.2 5.4* 3.4* 3.5  CL 98  --  102 101  CO2 17*  --  19* 20*  GLUCOSE 200*  --  121* 135*  BUN 57*  --  56* 57*  CREATININE 10.15*  --  9.82* 10.60*  CALCIUM 10.2  --  9.1 8.8*    Physical Exam   Blood pressure (!) 144/88, pulse 85, temperature 98.1 F (36.7 C), temperature source Oral, resp. rate 11, SpO2 99 %. GEN: NAD, awake and alert ENT: NCAT EYES: EOMI CV: RRR nl s1s2 PULM: CTAB ABD: s/nt/nd, no  r/g SKIN: PD exit site LUQ bandaged EXT:No LEE Nonfocal, CN2-12 grossly intact  Assessment  35F ESRD on PD with recurrent N/V, likely gastroparesis with flair  ESRD on PD, cont on home Rx tonight; no concern for infection N/V, seems to be improving today, per primary HTN/Vol: stable, use all 1.5% tonight DM1 per primary Anemia Hb stable 10.0 BMM: no acute needs  Plan As above, PD tonight  Rexene Agent  03/22/2021, 12:21 PM

## 2021-03-22 NOTE — Progress Notes (Signed)
Patient discharged per MD order.  IV removed with site unremarkable.  Tele removed.  All belongings placed in belongings bad and given to patient.  Patient voiced understanding of discharge instructions.  Transported to discharge via wheelchair.  ?

## 2021-03-22 NOTE — Progress Notes (Signed)
Patient evaluated at the bedside at the request of admitting team. Patient reports her nausea has improved. She was able to keep the food down. Patient feels comfortable going home tonight. Her boyfriend will be available to pick her up. ? ?On review of nephro's note, patient scheduled for PD session tonight. Spoke with nephrologist on-call who verified that dialysis nurse will set up patient tonight for PD.  Discussed this with patient patient but patient states she is comfortable doing PD at home and prefers to be discharged home tonight since her boyfriend is already here. ?Nephrologist on-call informed of patient's decision to go home and okay with plan.  Patient will be discharged home with plans to follow-up with [facility].  ?

## 2021-03-22 NOTE — Discharge Summary (Signed)
? ?Name: Barbara Donovan ?MRN: [id number] ?DOB: 23-Mar-1990 31 y.o. ?PCP: Scarlett Presto, MD ? ?Date of Admission: 03/21/2021  3:36 PM ?Date of Discharge:   03/22/21 ?Attending Physician: Lucious Groves, DO ? ?Discharge Diagnosis: ?1. 1. Chronic hypertension ?2. Type 1 diabetes mellitus with hyperglycemia; gastroparesis ?3. Intractable nausea and vomiting ?4. ESRD on PD ?5. Increased anion gap metabolic acidosis; resolved ? ?Discharge Medications: ?Allergies as of 03/22/2021   ?No Known Allergies ?  ? ?  ?Medication List  ?  ? ?TAKE these medications   ? ?acetaminophen 500 MG tablet ?Commonly known as: TYLENOL ?Take 1,000 mg by mouth every 6 (six) hours as needed for mild pain. ?  ?amLODipine 10 MG tablet ?Commonly known as: NORVASC ?Take 1 tablet (10 mg total) by mouth daily. ?  ?brimonidine 0.2 % ophthalmic solution ?Commonly known as: ALPHAGAN ?Place 1 drop into the left eye 3 (three) times daily. ?  ?calcitRIOL 0.5 MCG capsule ?Commonly known as: ROCALTROL ?Take 0.5 mcg by mouth daily. ?  ?carvedilol 25 MG tablet ?Commonly known as: COREG ?Take 1 tablet (25 mg total) by mouth 2 (two) times daily. ?  ?Continuous Blood Gluc Sensor Misc ?1 each by Does not apply route as directed. Use as directed every 14 days. May dispense FreeStyle [employer] or similar. ?  ?FLUoxetine 20 MG capsule ?Commonly known as: PROZAC ?Take 20 mg by mouth daily. ?  ?gentamicin cream 0.1 % ?Commonly known as: GARAMYCIN ?Apply 1 application topically daily. ?  ?heparin sodium (porcine) 1000 UNIT/ML injection ?Inject 3 mLs into the vein daily. ?  ?hydrALAZINE 50 MG tablet ?Commonly known as: APRESOLINE ?Take 1 tablet (50 mg total) by mouth 3 (three) times daily. ?What changed: when to take this ?  ?insulin lispro 100 UNIT/ML KwikPen ?Commonly known as: HumaLOG KwikPen ?Inject 3 Units into the skin 3 (three) times daily with meals. ?  ?Lantus SoloStar 100 UNIT/ML Solostar Pen ?Generic drug: insulin glargine ?Inject 20 Units into the skin  every morning. ?  ?metoCLOPramide 5 MG tablet ?Commonly known as: REGLAN ?Take 1 tablet (5 mg total) by mouth 3 (three) times daily before meals. ?  ?mirtazapine 7.5 MG tablet ?Commonly known as: REMERON ?Take 1 tablet (7.5 mg total) by mouth at bedtime. ?  ?multivitamin Tabs tablet ?Take 1 tablet by mouth at bedtime. ?  ?OneTouch Verio test strip ?Generic drug: glucose blood ?1 each by Other route 3 (three) times daily. And lancets 3/day ?What changed:  ?when to take this ?additional instructions ?  ?FREESTYLE TEST STRIPS test strip ?Generic drug: glucose blood ?Use as instructed ?What changed: Another medication with the same name was changed. Make sure you understand how and when to take each. ?  ?promethazine 25 MG suppository ?Commonly known as: PHENERGAN ?Place 1 suppository (25 mg total) rectally every 6 (six) hours as needed for nausea or vomiting. ?  ?sevelamer carbonate 800 MG tablet ?Commonly known as: RENVELA ?Take 1 tablet (800 mg total) by mouth 3 (three) times daily with meals. ?  ? ?  ? ? ?Disposition and follow-up:   ?BarbaraEDYN Donovan was discharged from [facility] in Stable condition.  At the hospital follow up visit please address: ? ?1.  NV/Gastroparesis- Make sure she is tolerating food and has adequate intake ? ?2. BP- adjust meds as needed ? ?3.  Labs / imaging needed at time of follow-up: none ? ?4.  Pending labs/ test needing follow-up: bmp ? ?Follow-up Appointments: ? ? ?Hospital  Course by problem list: ?1. Acute on chronic intractable N/V  ?Type 1 diabetes mellitus  ?Increased anion gap metabolic acidosis ?Presenting with 2 days of N/V and poor PO tolerance in the setting of long hx of possible diabetic gastroparesis. Anion gap of 22 -> 18, bicarb 17 -> 19, and glucose 200 -> 121. BHA only mildly elevated at 0.54. Suspected this was from starvation ketoacidosis and ESRD state, rather than euglycemic DKA. Anion gap improved with IVF and improvement in PO intake. Many  recent ED visits and hospital admissions, most recent being from 2/6-2/8, for ongoing N/V. During previous 2 admissions, symptoms were controlled with IV Reglan 10 mg Q6h, and discharge within 1-2 days. Patient's symptoms were managed with reglan Q6 and she was given some IVF until she was able to tolerate PO intake again. She was given an reduced dose of her home insulin. By 3/5 she was tolerating clear liquids and was advanced to a regular diet. She was able to eat and drink and had adequate PO intake and was deemed stable for DC. ? ?2. ESRD on PD ?Patient is on PD at home and will continue with this on discharge. ? ?3. HTN ?Hypertensive on admission with BP 187/106 and repeat 133/89 a couple hours later. She had previously not been able to keep down any of her medications and this is likely why her pressures were elevated. However will have her follow up in clinic to adjust medications as needed. ? ?Discharge Exam:   ?BP (!) 144/88 (BP Location: Left Arm)   Pulse 85   Temp 98.1 ?F (36.7 ?C) (Oral)   Resp 11   SpO2 99%  ?Discharge exam:  ?Constitutional: young woman resting comfortably in bed, in no acute distress ?HEENT: normocephalic atraumatic, mucous membranes moist, conjunctiva non-erythematous ?Cardiovascular: regular rate and rhythm, no m/r/g ?Pulmonary/Chest: normal work of breathing on room air, lungs clear to auscultation bilaterally ?Abdominal: soft, non-tender, non-distended, normal bowel sounds ?MSK: normal bulk and tone ?Neurological: alert & oriented x 3, answering questions appropriately ?Skin: warm and dry ?Psych: normal affect ? ?Pertinent Labs, Studies, and Procedures:  ? ?No results found.  ?CBC Latest Ref Rng & Units 03/22/2021 03/21/2021 03/21/2021  ?WBC 4.0 - 10.5 K/uL 5.4 - 6.5  ?Hemoglobin 12.0 - 15.0 g/dL 10.0(L) 13.3 12.4  ?Hematocrit 36.0 - 46.0 % 30.7(L) 39.0 38.6  ?Platelets 150 - 400 K/uL 305 - 387  ?  ?BMP Latest Ref Rng & Units 03/22/2021 03/21/2021 03/21/2021  ?Glucose 70 - 99 mg/dL  135(H) 121(H) -  ?BUN 6 - 20 mg/dL 57(H) 56(H) -  ?Creatinine 0.44 - 1.00 mg/dL 10.60(H) 9.82(H) -  ?BUN/Creat Ratio 9 - 23 - - -  ?Sodium 135 - 145 mmol/L 137 139 134(L)  ?Potassium 3.5 - 5.1 mmol/L 3.5 3.4(L) 5.4(H)  ?Chloride 98 - 111 mmol/L 101 102 -  ?CO2 22 - 32 mmol/L 20(L) 19(L) -  ?Calcium 8.9 - 10.3 mg/dL 8.8(L) 9.1 -  ?  ?Discharge Instructions: ? ? ?Signed: ?Scarlett Presto, MD ?03/22/2021, 12:17 PM   ?Pager: [phone]  ? ?

## 2021-03-22 NOTE — ED Notes (Signed)
Called for nurse to review purple man. Secretary stated that they had a code blue and she was tied up with that. Would inform her to review it when she is done. ?

## 2021-03-22 NOTE — Discharge Instructions (Signed)
Barbara Donovan ? ?You were recently admitted to [facility] for nausea and vomiting. We gave you medications to help with the nausea as well as IV fluids to keep you hydrated. ? ?Continue taking your home medications  ? ?You should seek further medical care if you have further nausea vomiting and are unable to keep down your medications, fever, chills, lightheadedness, or dizziness. ? ?We recommend that you see your primary care doctor in about a week to make sure that you continue to improve. We are so glad that you are feeling better. ? ?Sincerely, ?Scarlett Presto, MD ? ? ?

## 2021-03-25 ENCOUNTER — Ambulatory Visit: Payer: Medicare Other | Admitting: Nutrition

## 2021-03-25 ENCOUNTER — Observation Stay (HOSPITAL_COMMUNITY)
Admission: EM | Admit: 2021-03-25 | Discharge: 2021-03-27 | Disposition: A | Discharge status: Home / Self Care | Payer: 59 | Attending: Internal Medicine | Admitting: Internal Medicine

## 2021-03-25 DIAGNOSIS — F32A Depression, unspecified: Secondary | ICD-10-CM | POA: Insufficient documentation

## 2021-03-25 DIAGNOSIS — R9431 Abnormal electrocardiogram [ECG] [EKG]: Secondary | ICD-10-CM | POA: Diagnosis not present

## 2021-03-25 DIAGNOSIS — R000 Tachycardia, unspecified: Secondary | ICD-10-CM | POA: Diagnosis not present

## 2021-03-25 DIAGNOSIS — I10 Essential (primary) hypertension: Secondary | ICD-10-CM | POA: Diagnosis present

## 2021-03-25 DIAGNOSIS — Z794 Long term (current) use of insulin: Secondary | ICD-10-CM | POA: Insufficient documentation

## 2021-03-25 DIAGNOSIS — Z992 Dependence on renal dialysis: Secondary | ICD-10-CM | POA: Diagnosis present

## 2021-03-25 DIAGNOSIS — Z7901 Long term (current) use of anticoagulants: Secondary | ICD-10-CM | POA: Diagnosis not present

## 2021-03-25 DIAGNOSIS — Z20822 Contact with and (suspected) exposure to covid-19: Secondary | ICD-10-CM | POA: Insufficient documentation

## 2021-03-25 DIAGNOSIS — J45909 Unspecified asthma, uncomplicated: Secondary | ICD-10-CM | POA: Insufficient documentation

## 2021-03-25 DIAGNOSIS — Z79899 Other long term (current) drug therapy: Secondary | ICD-10-CM | POA: Diagnosis not present

## 2021-03-25 DIAGNOSIS — E1043 Type 1 diabetes mellitus with diabetic autonomic (poly)neuropathy: Secondary | ICD-10-CM | POA: Diagnosis not present

## 2021-03-25 DIAGNOSIS — Z833 Family history of diabetes mellitus: Secondary | ICD-10-CM | POA: Insufficient documentation

## 2021-03-25 DIAGNOSIS — E1065 Type 1 diabetes mellitus with hyperglycemia: Secondary | ICD-10-CM | POA: Insufficient documentation

## 2021-03-25 DIAGNOSIS — D631 Anemia in chronic kidney disease: Secondary | ICD-10-CM | POA: Insufficient documentation

## 2021-03-25 DIAGNOSIS — R112 Nausea with vomiting, unspecified: Secondary | ICD-10-CM | POA: Insufficient documentation

## 2021-03-25 DIAGNOSIS — I120 Hypertensive chronic kidney disease with stage 5 chronic kidney disease or end stage renal disease: Secondary | ICD-10-CM | POA: Insufficient documentation

## 2021-03-25 DIAGNOSIS — E872 Acidosis, unspecified: Secondary | ICD-10-CM | POA: Diagnosis not present

## 2021-03-25 DIAGNOSIS — K3184 Gastroparesis: Secondary | ICD-10-CM | POA: Insufficient documentation

## 2021-03-25 DIAGNOSIS — E8729 Other acidosis: Secondary | ICD-10-CM | POA: Diagnosis present

## 2021-03-25 DIAGNOSIS — E876 Hypokalemia: Secondary | ICD-10-CM | POA: Insufficient documentation

## 2021-03-25 DIAGNOSIS — E861 Hypovolemia: Secondary | ICD-10-CM | POA: Insufficient documentation

## 2021-03-25 DIAGNOSIS — Z8249 Family history of ischemic heart disease and other diseases of the circulatory system: Secondary | ICD-10-CM | POA: Insufficient documentation

## 2021-03-25 DIAGNOSIS — N186 End stage renal disease: Secondary | ICD-10-CM | POA: Insufficient documentation

## 2021-03-25 DIAGNOSIS — Z825 Family history of asthma and other chronic lower respiratory diseases: Secondary | ICD-10-CM | POA: Diagnosis not present

## 2021-03-25 DIAGNOSIS — E860 Dehydration: Secondary | ICD-10-CM | POA: Diagnosis not present

## 2021-03-25 DIAGNOSIS — E1022 Type 1 diabetes mellitus with diabetic chronic kidney disease: Secondary | ICD-10-CM | POA: Insufficient documentation

## 2021-03-25 LAB — COMPREHENSIVE METABOLIC PANEL
ALT: 15 U/L (ref 0–44)
AST: 16 U/L (ref 15–41)
Albumin: 3.8 g/dL (ref 3.5–5.0)
Alkaline Phosphatase: 52 U/L (ref 38–126)
Anion gap: 21 — ABNORMAL HIGH (ref 5–15)
BUN: 48 mg/dL — ABNORMAL HIGH (ref 6–20)
CO2: 18 mmol/L — ABNORMAL LOW (ref 22–32)
Calcium: 10 mg/dL (ref 8.9–10.3)
Chloride: 95 mmol/L — ABNORMAL LOW (ref 98–111)
Creatinine, Ser: 9.95 mg/dL — ABNORMAL HIGH (ref 0.44–1.00)
GFR, Estimated: 5 mL/min — ABNORMAL LOW (ref >60)
Glucose, Bld: 274 mg/dL — ABNORMAL HIGH (ref 70–99)
Potassium: 3.3 mmol/L — ABNORMAL LOW (ref 3.5–5.1)
Sodium: 134 mmol/L — ABNORMAL LOW (ref 135–145)
Total Bilirubin: 0.5 mg/dL (ref 0.3–1.2)
Total Protein: 7.8 g/dL (ref 6.5–8.1)

## 2021-03-25 LAB — RENAL FUNCTION PANEL
Albumin: 3.3 g/dL — ABNORMAL LOW (ref 3.5–5.0)
Anion gap: 16 — ABNORMAL HIGH (ref 5–15)
BUN: 49 mg/dL — ABNORMAL HIGH (ref 6–20)
CO2: 18 mmol/L — ABNORMAL LOW (ref 22–32)
Calcium: 9.4 mg/dL (ref 8.9–10.3)
Chloride: 104 mmol/L (ref 98–111)
Creatinine, Ser: 9.52 mg/dL — ABNORMAL HIGH (ref 0.44–1.00)
GFR, Estimated: 5 mL/min — ABNORMAL LOW (ref >60)
Glucose, Bld: 128 mg/dL — ABNORMAL HIGH (ref 70–99)
Phosphorus: 7.3 mg/dL — ABNORMAL HIGH (ref 2.5–4.6)
Potassium: 3.3 mmol/L — ABNORMAL LOW (ref 3.5–5.1)
Sodium: 138 mmol/L (ref 135–145)

## 2021-03-25 LAB — URINALYSIS, ROUTINE W REFLEX MICROSCOPIC
Bilirubin Urine: NEGATIVE
Glucose, UA: >=500 mg/dL — AB
Hgb urine dipstick: NEGATIVE
Ketones, ur: 5 mg/dL — AB
Leukocytes,Ua: NEGATIVE
Nitrite: NEGATIVE
Protein, ur: 100 mg/dL — AB
Specific Gravity, Urine: 1.01 (ref 1.005–1.030)
pH: 7 (ref 5.0–8.0)

## 2021-03-25 LAB — I-STAT VENOUS BLOOD GAS, ED
Acid-Base Excess: 0 mmol/L (ref 0.0–2.0)
Bicarbonate: 23 mmol/L (ref 20.0–28.0)
Calcium, Ion: 1.12 mmol/L — ABNORMAL LOW (ref 1.15–1.40)
HCT: 38 % (ref 36.0–46.0)
Hemoglobin: 12.9 g/dL (ref 12.0–15.0)
O2 Saturation: 97 %
Potassium: 3.1 mmol/L — ABNORMAL LOW (ref 3.5–5.1)
Sodium: 135 mmol/L (ref 135–145)
TCO2: 24 mmol/L (ref 22–32)
pCO2, Ven: 31.5 mmHg — ABNORMAL LOW (ref 44–60)
pH, Ven: 7.471 — ABNORMAL HIGH (ref 7.25–7.43)
pO2, Ven: 81 mmHg — ABNORMAL HIGH (ref 32–45)

## 2021-03-25 LAB — CBC
HCT: 40.5 % (ref 36.0–46.0)
Hemoglobin: 13.1 g/dL (ref 12.0–15.0)
MCH: 27.5 pg (ref 26.0–34.0)
MCHC: 32.3 g/dL (ref 30.0–36.0)
MCV: 85.1 fL (ref 80.0–100.0)
Platelets: 413 10*3/uL — ABNORMAL HIGH (ref 150–400)
RBC: 4.76 MIL/uL (ref 3.87–5.11)
RDW: 15.4 % (ref 11.5–15.5)
WBC: 5.2 10*3/uL (ref 4.0–10.5)
nRBC: 0 % (ref 0.0–0.2)

## 2021-03-25 LAB — BASIC METABOLIC PANEL
Anion gap: 15 (ref 5–15)
BUN: 47 mg/dL — ABNORMAL HIGH (ref 6–20)
CO2: 19 mmol/L — ABNORMAL LOW (ref 22–32)
Calcium: 9.3 mg/dL (ref 8.9–10.3)
Chloride: 104 mmol/L (ref 98–111)
Creatinine, Ser: 9.63 mg/dL — ABNORMAL HIGH (ref 0.44–1.00)
GFR, Estimated: 5 mL/min — ABNORMAL LOW (ref >60)
Glucose, Bld: 134 mg/dL — ABNORMAL HIGH (ref 70–99)
Potassium: 3.4 mmol/L — ABNORMAL LOW (ref 3.5–5.1)
Sodium: 138 mmol/L (ref 135–145)

## 2021-03-25 LAB — RESP PANEL BY RT-PCR (FLU A&B, COVID) ARPGX2
Influenza A by PCR: NEGATIVE
Influenza B by PCR: NEGATIVE
SARS Coronavirus 2 by RT PCR: NEGATIVE

## 2021-03-25 LAB — HEMOGLOBIN A1C
Hgb A1c MFr Bld: 7.6 % — ABNORMAL HIGH (ref 4.8–5.6)
Mean Plasma Glucose: 171.42 mg/dL

## 2021-03-25 LAB — BETA-HYDROXYBUTYRIC ACID: Beta-Hydroxybutyric Acid: 1.27 mmol/L — ABNORMAL HIGH (ref 0.05–0.27)

## 2021-03-25 LAB — I-STAT BETA HCG BLOOD, ED (MC, WL, AP ONLY): I-stat hCG, quantitative: <5 m[IU]/mL (ref <5)

## 2021-03-25 LAB — GLUCOSE, CAPILLARY
Glucose-Capillary: 114 mg/dL — ABNORMAL HIGH (ref 70–99)
Glucose-Capillary: 136 mg/dL — ABNORMAL HIGH (ref 70–99)
Glucose-Capillary: 156 mg/dL — ABNORMAL HIGH (ref 70–99)
Glucose-Capillary: 162 mg/dL — ABNORMAL HIGH (ref 70–99)

## 2021-03-25 LAB — CBG MONITORING, ED
Glucose-Capillary: 171 mg/dL — ABNORMAL HIGH (ref 70–99)
Glucose-Capillary: 152 mg/dL — ABNORMAL HIGH (ref 70–99)

## 2021-03-25 LAB — LIPASE, BLOOD: Lipase: 52 U/L — ABNORMAL HIGH (ref 11–51)

## 2021-03-25 MED ORDER — ACETAMINOPHEN 325 MG PO TABS
650.0000 mg | ORAL_TABLET | Freq: Four times a day (QID) | ORAL | Status: DC | PRN
  Administered 2021-03-25: 650 mg via ORAL
  Filled 2021-03-25: qty 2

## 2021-03-25 MED ORDER — CARVEDILOL 12.5 MG PO TABS
25.0000 mg | ORAL_TABLET | Freq: Two times a day (BID) | ORAL | Status: DC
Start: 2021-03-25 — End: 2021-03-27
  Administered 2021-03-25 – 2021-03-27 (×4): 25 mg via ORAL
  Filled 2021-03-25 (×4): qty 2

## 2021-03-25 MED ORDER — INSULIN GLARGINE 100 UNIT/ML ~~LOC~~ SOLN
10.0000 [IU] | SUBCUTANEOUS | Status: DC
  Filled 2021-03-25 (×2): qty 0.1

## 2021-03-25 MED ORDER — FLUOXETINE HCL 20 MG PO CAPS
20.0000 mg | ORAL_CAPSULE | Freq: Every day | ORAL | Status: DC
  Administered 2021-03-25 – 2021-03-27 (×3): 20 mg via ORAL
  Filled 2021-03-25 (×3): qty 1

## 2021-03-25 MED ORDER — HYDRALAZINE HCL 20 MG/ML IJ SOLN: 5.0000 mg | Freq: Four times a day (QID) | INTRAMUSCULAR | Status: DC | PRN

## 2021-03-25 MED ORDER — AMLODIPINE BESYLATE 10 MG PO TABS
10.0000 mg | ORAL_TABLET | Freq: Every day | ORAL | Status: DC
  Administered 2021-03-25 – 2021-03-27 (×3): 10 mg via ORAL
  Filled 2021-03-25 (×3): qty 1

## 2021-03-25 MED ORDER — DEXTROSE 5 % IV SOLN: INTRAVENOUS | Status: DC

## 2021-03-25 MED ORDER — GENTAMICIN SULFATE 0.1 % EX CREA
1.0000 "application " | TOPICAL_CREAM | Freq: Every day | CUTANEOUS | Status: DC
  Administered 2021-03-25 – 2021-03-26 (×3): 1 via TOPICAL
  Filled 2021-03-25: qty 15

## 2021-03-25 MED ORDER — POTASSIUM CHLORIDE 10 MEQ/100ML IV SOLN
10.0000 meq | INTRAVENOUS | Status: AC
  Administered 2021-03-25 (×3): 10 meq via INTRAVENOUS
  Filled 2021-03-25 (×3): qty 100

## 2021-03-25 MED ORDER — INSULIN REGULAR(HUMAN) IN NACL 100-0.9 UT/100ML-% IV SOLN
INTRAVENOUS | Status: DC
  Administered 2021-03-25: 0.3 [IU]/h via INTRAVENOUS
  Administered 2021-03-26: 08:00:00 0.9 [IU]/h via INTRAVENOUS
  Filled 2021-03-25 (×2): qty 100

## 2021-03-25 MED ORDER — ACETAMINOPHEN 650 MG RE SUPP: 650.0000 mg | RECTAL | Status: DC | PRN

## 2021-03-25 MED ORDER — SODIUM CHLORIDE 0.9 % IV SOLN: INTRAVENOUS | Status: DC

## 2021-03-25 MED ORDER — METOCLOPRAMIDE HCL 5 MG/ML IJ SOLN
5.0000 mg | Freq: Four times a day (QID) | INTRAMUSCULAR | Status: DC
  Administered 2021-03-25 – 2021-03-27 (×7): 5 mg via INTRAVENOUS
  Filled 2021-03-25 (×7): qty 2

## 2021-03-25 MED ORDER — METOCLOPRAMIDE HCL 10 MG PO TABS
5.0000 mg | ORAL_TABLET | Freq: Three times a day (TID) | ORAL | Status: DC
  Administered 2021-03-25: 5 mg via ORAL
  Filled 2021-03-25: qty 1

## 2021-03-25 MED ORDER — DEXTROSE 50 % IV SOLN: 0.0000 mL | INTRAVENOUS | Status: DC | PRN

## 2021-03-25 MED ORDER — ONDANSETRON HCL 4 MG/2ML IJ SOLN: 4.0000 mg | Freq: Four times a day (QID) | INTRAMUSCULAR | Status: DC | PRN

## 2021-03-25 MED ORDER — ACETAMINOPHEN 650 MG RE SUPP: 650.0000 mg | Freq: Four times a day (QID) | RECTAL | Status: DC | PRN

## 2021-03-25 MED ORDER — HEPARIN SODIUM (PORCINE) 5000 UNIT/ML IJ SOLN
5000.0000 [IU] | Freq: Three times a day (TID) | INTRAMUSCULAR | Status: DC
  Administered 2021-03-25 – 2021-03-27 (×6): 5000 [IU] via SUBCUTANEOUS
  Filled 2021-03-25 (×6): qty 1

## 2021-03-25 MED ORDER — LORAZEPAM 2 MG/ML IJ SOLN: 0.5000 mg | Freq: Four times a day (QID) | INTRAMUSCULAR | Status: DC | PRN

## 2021-03-25 MED ORDER — SODIUM CHLORIDE 0.9 % IV BOLUS
1000.0000 mL | Freq: Once | INTRAVENOUS | Status: AC
Start: 2021-03-25 — End: 2021-03-25
  Administered 2021-03-25: 1000 mL via INTRAVENOUS

## 2021-03-25 MED ORDER — HYDRALAZINE HCL 50 MG PO TABS
50.0000 mg | ORAL_TABLET | Freq: Three times a day (TID) | ORAL | Status: DC
  Administered 2021-03-25 – 2021-03-27 (×6): 50 mg via ORAL
  Filled 2021-03-25 (×6): qty 1

## 2021-03-25 MED ORDER — DELFLEX-LC/1.5% DEXTROSE 344 MOSM/L IP SOLN
INTRAPERITONEAL | Status: DC
  Administered 2021-03-25: 5000 mL via INTRAPERITONEAL

## 2021-03-25 MED ORDER — METOCLOPRAMIDE HCL 5 MG/ML IJ SOLN
10.0000 mg | Freq: Once | INTRAMUSCULAR | Status: AC
  Administered 2021-03-25: 10 mg via INTRAVENOUS
  Filled 2021-03-25: qty 2

## 2021-03-25 MED ORDER — ONDANSETRON HCL 4 MG PO TABS
4.0000 mg | ORAL_TABLET | Freq: Four times a day (QID) | ORAL | Status: DC | PRN
Start: 2021-03-25 — End: 2021-03-27

## 2021-03-25 NOTE — Progress Notes (Addendum)
Paged and spoke with MD Allyson Sabal, patient's blood pressure elevated at 183/104 and patient complains of a 5/10 headache.  No PRN medications available.  MD advised will restart patient's PO blood pressure medications and also ordered PRN medication if needed.  MD Allyson Sabal advised okay if SBP in the 180's if patient is asymptomatic but to use PRN medication if patient symptomatic and or if SBP remains elevated greater than 180's after administration of PO medications.   ?

## 2021-03-25 NOTE — ED Triage Notes (Signed)
EMS stated, she has had N/V since yesterday, has not taken her medication yesterday or today. She does peritoneal dialysis. She did dialysis last night. The phenergan is not helping  ?

## 2021-03-25 NOTE — ED Notes (Signed)
Pt

## 2021-03-25 NOTE — ED Provider Notes (Signed)
[facility] EMERGENCY DEPARTMENT Provider Note   CSN: [id number] Arrival date & time: 03/25/21  2536     History  Chief Complaint  Patient presents with   Nausea   Emesis   Abdominal Pain    Barbara Donovan is a 31 y.o. female.  The history is provided by the patient and medical records. No language interpreter was used.  Emesis Severity:  Severe Duration:  2 days Timing:  Constant Progression:  Unchanged Chronicity:  Recurrent Relieved by:  Nothing Worsened by:  Nothing Ineffective treatments:  Antiemetics Associated symptoms: abdominal pain   Associated symptoms: no chills, no cough, no diarrhea, no fever and no headaches   Risk factors: diabetes   Abdominal Pain Pain location:  Epigastric Pain quality: aching   Pain severity:  Mild Progression:  Resolved Chronicity:  Recurrent Relieved by:  Nothing Worsened by:  Vomiting Ineffective treatments:  None tried Associated symptoms: fatigue, nausea and vomiting   Associated symptoms: no chest pain, no chills, no constipation, no cough, no diarrhea, no dysuria, no fever and no shortness of breath       Home Medications Prior to Admission medications   Medication Sig Start Date End Date Taking? Authorizing Provider  acetaminophen (TYLENOL) 500 MG tablet Take 1,000 mg by mouth every 6 (six) hours as needed for mild pain.    [provider]  amLODipine (NORVASC) 10 MG tablet Take 1 tablet (10 mg total) by mouth daily. 03/09/20   Almon Hercules, MD  brimonidine (ALPHAGAN) 0.2 % ophthalmic solution Place 1 drop into the left eye 3 (three) times daily. 03/09/21   [provider]  calcitRIOL (ROCALTROL) 0.5 MCG capsule Take 0.5 mcg by mouth daily. 12/02/20   [provider]  carvedilol (COREG) 25 MG tablet Take 1 tablet (25 mg total) by mouth 2 (two) times daily. 03/07/19   Dyann Kief, PA-C  Continuous Blood Gluc Sensor MISC 1 each by Does not apply route as directed. Use as  directed every 14 days. May dispense FreeStyle [employer] or similar. Patient not taking: Reported on 03/21/2021 01/27/20   Lanae Boast, MD  FLUoxetine (PROZAC) 20 MG capsule Take 20 mg by mouth daily. 03/16/21   [provider]  gentamicin cream (GARAMYCIN) 0.1 % Apply 1 application topically daily. 09/10/20   [provider]  glucose blood (FREESTYLE TEST STRIPS) test strip Use as instructed Patient not taking: Reported on 03/21/2021 02/25/21   Lorin Glass, MD  glucose blood (ONETOUCH VERIO) test strip 1 each by Other route 3 (three) times daily. And lancets 3/day Patient taking differently: 1 each by Other route 4 (four) times daily -  before meals and at bedtime. 11/28/20   Romero Belling, MD  heparin sodium, porcine, 1000 UNIT/ML injection Inject 3 mLs into the vein daily. 01/20/21   [provider]  hydrALAZINE (APRESOLINE) 50 MG tablet Take 1 tablet (50 mg total) by mouth 3 (three) times daily. Patient taking differently: Take 50 mg by mouth in the morning and at bedtime. 12/31/20   Zannie Cove, MD  insulin glargine (LANTUS SOLOSTAR) 100 UNIT/ML Solostar Pen Inject 20 Units into the skin every morning. 02/05/21   Osvaldo Shipper, MD  insulin lispro (HUMALOG KWIKPEN) 100 UNIT/ML KwikPen Inject 3 Units into the skin 3 (three) times daily with meals. 11/28/20   Romero Belling, MD  metoCLOPramide (REGLAN) 5 MG tablet Take 1 tablet (5 mg total) by mouth 3 (three) times daily before meals. 02/23/21  Pricilla Loveless, MD  mirtazapine (REMERON) 7.5 MG tablet Take 1 tablet (7.5 mg total) by mouth at bedtime. 02/05/21   Osvaldo Shipper, MD  multivitamin (RENA-VIT) TABS tablet Take 1 tablet by mouth at bedtime. 03/08/20   Almon Hercules, MD  promethazine (PHENERGAN) 25 MG suppository Place 1 suppository (25 mg total) rectally every 6 (six) hours as needed for nausea or vomiting. 02/19/21   Arthor Captain, PA-C  sevelamer carbonate (RENVELA) 800 MG tablet Take 1 tablet (800 mg total)  by mouth 3 (three) times daily with meals. 02/05/21   Osvaldo Shipper, MD      Allergies    Patient has no known allergies.    Review of Systems   Review of Systems  Constitutional:  Positive for fatigue. Negative for chills, diaphoresis and fever.  HENT:  Negative for congestion.   Eyes:  Negative for visual disturbance.  Respiratory:  Negative for cough, chest tightness, shortness of breath and wheezing.   Cardiovascular:  Negative for chest pain.  Gastrointestinal:  Positive for abdominal pain, nausea and vomiting. Negative for constipation and diarrhea.  Genitourinary:  Positive for decreased urine volume. Negative for dysuria, flank pain and frequency.  Musculoskeletal:  Negative for back pain, neck pain and neck stiffness.  Neurological:  Negative for light-headedness and headaches.  Psychiatric/Behavioral:  Negative for agitation.   All other systems reviewed and are negative.  Physical Exam Updated Vital Signs BP (!) 191/104 (BP Location: Right Arm)   Pulse (!) 106   Temp 98.5 F (36.9 C) (Oral)   Resp 18   SpO2 100%  Physical Exam Vitals and nursing note reviewed.  Constitutional:      General: She is not in acute distress.    Appearance: She is well-developed. She is ill-appearing. She is not toxic-appearing or diaphoretic.  HENT:     Head: Normocephalic and atraumatic.     Mouth/Throat:     Mouth: Mucous membranes are dry.     Pharynx: No oropharyngeal exudate or posterior oropharyngeal erythema.  Eyes:     Extraocular Movements: Extraocular movements intact.     Conjunctiva/sclera: Conjunctivae normal.     Pupils: Pupils are equal, round, and reactive to light.  Cardiovascular:     Rate and Rhythm: Normal rate and regular rhythm.     Heart sounds: No murmur heard. Pulmonary:     Effort: Pulmonary effort is normal. No respiratory distress.     Breath sounds: Normal breath sounds. No wheezing, rhonchi or rales.  Chest:     Chest wall: No tenderness.   Abdominal:     General: Abdomen is flat.     Palpations: Abdomen is soft.     Tenderness: There is no abdominal tenderness. There is no guarding.  Musculoskeletal:        General: No swelling or tenderness.     Cervical back: Neck supple. No tenderness.  Skin:    General: Skin is warm and dry.     Capillary Refill: Capillary refill takes less than 2 seconds.     Findings: No erythema.  Neurological:     General: No focal deficit present.     Mental Status: She is alert.  Psychiatric:        Mood and Affect: Mood normal.    ED Results / Procedures / Treatments   Labs (all labs ordered are listed, but only abnormal results are displayed) Labs Reviewed  LIPASE, BLOOD - Abnormal; Notable for the following components:      Result  Value   Lipase 52 (*)    All other components within normal limits  COMPREHENSIVE METABOLIC PANEL - Abnormal; Notable for the following components:   Sodium 134 (*)    Potassium 3.3 (*)    Chloride 95 (*)    CO2 18 (*)    Glucose, Bld 274 (*)    BUN 48 (*)    Creatinine, Ser 9.95 (*)    GFR, Estimated 5 (*)    Anion gap 21 (*)    All other components within normal limits  CBC - Abnormal; Notable for the following components:   Platelets 413 (*)    All other components within normal limits  BETA-HYDROXYBUTYRIC ACID - Abnormal; Notable for the following components:   Beta-Hydroxybutyric Acid 1.27 (*)    All other components within normal limits  I-STAT VENOUS BLOOD GAS, ED - Abnormal; Notable for the following components:   pH, Ven 7.471 (*)    pCO2, Ven 31.5 (*)    pO2, Ven 81 (*)    Potassium 3.1 (*)    Calcium, Ion 1.12 (*)    All other components within normal limits  URINALYSIS, ROUTINE W REFLEX MICROSCOPIC  BLOOD GAS, VENOUS  HEMOGLOBIN A1C  I-STAT BETA HCG BLOOD, ED ([facility], [facility], [facility] ONLY)    EKG None  Radiology No results found.  Procedures Procedures    Medications Ordered in ED Medications  heparin injection 5,000 Units (has  no administration in time range)  ondansetron (ZOFRAN) tablet 4 mg (has no administration in time range)    Or  ondansetron (ZOFRAN) injection 4 mg (has no administration in time range)  metoCLOPramide (REGLAN) tablet 5 mg (has no administration in time range)  insulin regular, human (MYXREDLIN) 100 units/ 100 mL infusion (has no administration in time range)  dextrose 50 % solution 0-50 mL (has no administration in time range)  dextrose 5 % solution (has no administration in time range)  sodium chloride 0.9 % bolus 1,000 mL (1,000 mLs Intravenous New Bag/Given 03/25/21 1208)  metoCLOPramide (REGLAN) injection 10 mg (10 mg Intravenous Given 03/25/21 1209)    ED Course/ Medical Decision Making/ A&P                           Medical Decision Making Amount and/or Complexity of Data Reviewed Labs: ordered. ECG/medicine tests: ordered.  Risk Prescription drug management. Decision regarding hospitalization.    DAMARIAH CUDAHY is a 31 y.o. female with a past medical history significant for ESRD on peritoneal dialysis, hypertension, asthma, type 1 diabetes, and recent admission for nausea, vomiting, dehydration, and flare of gastroparesis who presents with nausea, vomiting, and abdominal cramping.  Patient reports that she was admitted several days ago and was discharged 3 days ago.  She reports that yesterday her symptoms returned with nausea vomiting she has not been keep any food or fluids down.  She reports some cramping in her abdomen but does not report overall abdominal pain.  She reports she took her peritoneal dialysis without difficulty last night and there is no dialysis currently.  She is denying current lower abdominal pain.  She is dry heaving with nausea and vomiting and denies any constipation or diarrhea.  Denies any fevers, chills congestion, cough, chest pain, palpitations, or shortness of breath.  She reports this feels how she did when she had to be admitted for uncontrollable  nausea vomiting and dehydration.  On exam, lungs clear.  Chest nontender.  Abdomen was  nontender and I did hear bowel sounds.  Patient moving all extremities.  Patient is dry heaving and has dry mucous membranes on exam.  Clinically I suspect recurrent gastroparesis leading to dehydration however during her recent admission she had to be hydrated and given nausea medicine to prevent DKA developing.  We will get screening labs and get work-up started by anticipate readmission if she is not tolerating p.o.  1:36 PM Does not appear to be in DKA.  Patient is not acidotic.  Beta hydroxybutyric acid is more elevated however at 1.27.  Patient has not been able to make urine for [name] yet.  We will call internal medicine teaching service where she was just discharged from about readmission for continued dehydration and uncontrolled emesis.   Medicine will see and admit.        Final Clinical Impression(s) / ED Diagnoses Final diagnoses:  Nausea and vomiting, unspecified vomiting type  Dehydration    Clinical Impression: 1. Nausea and vomiting, unspecified vomiting type   2. Dehydration     Disposition: Admit  This note was prepared with assistance of Dragon voice recognition software. Occasional wrong-word or sound-a-like substitutions may have occurred due to the inherent limitations of voice recognition software.      Eldwin Volkov, Canary Brim, MD 03/25/21 [phone]

## 2021-03-25 NOTE — H&P (Addendum)
Date: 03/25/2021               Patient Name:  Barbara Donovan MRN: [id number]  DOB: 08-08-1990 Age / Sex: 31 y.o., female   PCP: Scarlett Presto, MD              Medical Service: Internal Medicine Teaching Service              Attending Physician: Dr. Jimmye Norman, Elaina Pattee, MD    First Contact: Nada Maclachlan, [place] [address] Pager: [phone]  Second Contact: Dr. Scarlett Presto Pager: [phone]  Third Contact Dr. Virl Axe Pager: [phone]       After Hours (After 5p/  First Contact Pager: [phone]  weekends / holidays): Second Contact Pager: [phone]   Chief Complaint: intractable nausea and vomiting  History of Present Illness:  Ms Barbara Donovan is a 31 year old female with a history of T1DM, ESRD on PD since May 2022, HTN, and frequent hospitalizations for intractable N/V (13 visits within the past 6 months requiring tx with IV Reglan q6h and IVF) presenting today with intractable N/V and poor PO intake x 2 days. Patient was previously hospitalized for intractable nausea and vomiting on 03/21/21. Patient states she feels as though she did not fully recover before discharge and is returning today with similar presentation. Patient states she has been having worsening nausea since discharge that acutely worsened yesterday when she began vomiting yesterday and has since been constant, denies any blood in vomit. Patient states current n/v episode same as last time. She states she has been taking medications as prescribed for n/v and her home diabetes medications. She has attempted to use both phenergan and reglan but these have not provided any relief for her. Last dose of medication was yesterday. She has been drinking water, but has not been able to keep down any PO intake, states she feels dehydrated today. Home CBGs have been in the mid 100s.  Patient was able to perform PD yesterday with no problems. She continues to make urine and denies associated dysuria or burning with urination. Denies any fevers, chills,  constipation, diarrhea, abdominal pain, dizziness, lightheadedness, headaches, SHOB. Patient states last BM was yesterday and was normal in bulk, color, and size. No recent marijuana or other recreational drug use. Does not believe that she could be pregnant.   Meds:  No outpatient medications have been marked as taking for the 03/25/21 encounter [facility] Encounter).          Current Outpatient Medications on File Prior to Encounter  Medication Sig Dispense Refill   acetaminophen (TYLENOL) 500 MG tablet Take 1,000 mg by mouth every 6 (six) hours as needed for mild pain.       amLODipine (NORVASC) 10 MG tablet Take 1 tablet (10 mg total) by mouth daily. 90 tablet 1   brimonidine (ALPHAGAN) 0.2 % ophthalmic solution Place 1 drop into the left eye 3 (three) times daily.       calcitRIOL (ROCALTROL) 0.5 MCG capsule Take 0.5 mcg by mouth daily.       carvedilol (COREG) 25 MG tablet Take 1 tablet (25 mg total) by mouth 2 (two) times daily. 180 tablet 3   FLUoxetine (PROZAC) 20 MG capsule Take 20 mg by mouth daily.       gentamicin cream (GARAMYCIN) 0.1 % Apply 1 application topically daily.       glucose blood (ONETOUCH VERIO) test strip 1 each by Other route 3 (three) times daily. And lancets 3/day (Patient taking differently:  1 each by Other route 4 (four) times daily -  before meals and at bedtime.) 300 each 3   heparin sodium, porcine, 1000 UNIT/ML injection Inject 3 mLs into the vein daily.       hydrALAZINE (APRESOLINE) 50 MG tablet Take 1 tablet (50 mg total) by mouth 3 (three) times daily. (Patient taking differently: Take 50 mg by mouth in the morning and at bedtime.)       insulin glargine (LANTUS SOLOSTAR) 100 UNIT/ML Solostar Pen Inject 20 Units into the skin every morning. 15 mL 2   insulin lispro (HUMALOG KWIKPEN) 100 UNIT/ML KwikPen Inject 3 Units into the skin 3 (three) times daily with meals. 15 mL 3   metoCLOPramide (REGLAN) 5 MG tablet Take 1 tablet (5 mg total) by mouth 3 (three)  times daily before meals. 90 tablet 0   mirtazapine (REMERON) 7.5 MG tablet Take 1 tablet (7.5 mg total) by mouth at bedtime. 30 tablet 1   multivitamin (RENA-VIT) TABS tablet Take 1 tablet by mouth at bedtime. 90 tablet 1   promethazine (PHENERGAN) 25 MG suppository Place 1 suppository (25 mg total) rectally every 6 (six) hours as needed for nausea or vomiting. 10 suppository 0   sevelamer carbonate (RENVELA) 800 MG tablet Take 1 tablet (800 mg total) by mouth 3 (three) times daily with meals. 90 tablet 1   Continuous Blood Gluc Sensor MISC 1 each by Does not apply route as directed. Use as directed every 14 days. May dispense FreeStyle [employer] or similar. (Patient not taking: Reported on 03/21/2021) 1 each 0   glucose blood (FREESTYLE TEST STRIPS) test strip Use as instructed (Patient not taking: Reported on 03/21/2021) 100 each 0    Allergies: Allergies as of 03/25/2021   (No Known Allergies)   Past Medical History:  Diagnosis Date   Asthma    as a child, no problems as an adult, no inhaler   Cataract    NS OU   Chronic hypertension during pregnancy, antepartum 08/19/2017   Dehydration 01/28/2018   Depression during pregnancy, antepartum 07/07/2017   6/20: Short trial of zoloft previously, reports didn't help much but also didn't give it a chance Discussed r/b/a SSRIs in pregnancy, agrees to try Zoloft again, rx sent No SI/HI/red flags   Diabetes ([place])    TYPE I. A1C 7.5% 05/31/20   Diabetic retinopathy ([place]) 06/09/2017   07/2017 with bilateral severe diabetic non-proliferative retinopathy with macular edema.   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 A2/B GDM)  Required Referrals for A1GDM or A2GDM: [x]  Diabetes Education and Testing Supplies [x]  Nutrition Cousult  For A2/B GDM or higher classes of DM [x]  Diabetes Education and Testing Supplies [x]  Nutrition Counsult [x]  Fetal ECHO after 20 weeks  [x]  Eye exam for retina evaluation - severe retinopathy 7/19  Base   Ventricular septal defect (VSD) of fetus in singleton pregnancy, antepartum 09/30/2017   May go to newborn nursery per Dr. Lenard Simmer Echo prior to discharge    Family History:  Mother- DM, seizures  Father- DM, COPD, MI  Maternal grandfather- stroke   Social History:  Lives with son and boyfriend in [place] Denies alcohol, tobacco, or illicit drug use.  IADLs/ADLs- can person independently at baseline   Review of Systems: A complete ROS was negative except as per HPI.   Physical Exam: Blood pressure (!) 164/91, pulse 88, temperature 98.5 F (36.9 C), temperature source Oral, resp. rate 18, SpO2 97 %. Constitutional: Acutely ill appearing young female, intermittently dry-heaving HEENT: normocephalic, dry mucus membranes, no pallor present Cardio: tachycardic, regular rhythm, no murmurs auscultated Pulm: CTAB, no increased work of breathing on RA, no wheezes or rales Abdominal: No pain to palpation, non-distended, normoactive bowel sounds, PD port intact, no pain, erythema, fluctuance, or discharge of the site MSK: normal tone and bulk, moving all extremities spontaneously Skin: warm and dry, mildly decreased skin turgor, no observable rashes Neuro: alert and oriented x3, answering questions appropriately Psych: flat affect  EKG: personally reviewed my interpretation is sinus tachycardia with mildly prolonged QTc. Unchanged from prior.  Assessment & Plan by Problem: Principal Problem:   Intractable nausea and vomiting Active Problems:    Chronic hypertension   Type 1 diabetes mellitus with hyperglycemia (HCC)   ESRD (end stage renal disease) on PD   Increased anion gap metabolic acidosis   Diabetic gastroparesis associated with type 1 diabetes mellitus (HCC)  Type 1 DM with hyperglycemia Increased anion gap metabolic acidosis concerning for DKA Elevated anion gap of 21and glucose 274. BHA  elevated at 1.27. Suspect patient could be in DKA, Ketones present in urine as well as >500 glucose. Starvation ketoacidosis given patient has not eaten in almost two days vs ESRD state also on the differential. Bicarb is normal at 23 and patient is slightly alkalotic however. Most recent A1c 6.9 on 01/21/21, home CBG's in mid-100's. Follows with [facility], and is currently on Lantus 20U nightly, and Humalog 3U TID prn. Given concern for DKA will start the endotool. - Start on regular insulin 100units/138mL infusion - 0.9 % NaCl infusion at 75mL/hr - dextrose 5 % 48mL/hr  when blood sugar <250 - Trend BMP Q4, and BHB Q8 - endotool - telemetry  Acute on chronic Intractable N/V Diabetic gastroparesis associated with type 1 DM Prolonged QTc Patient with hx of possible gastroparesis, presenting today with two days of progressive n/v and poor po intake. N/V may be be related to gastroparesis vs DKA as above. She is followed by Dr. Fuller Plan with [facility] and has undergone extensive studies without clear etiology. Studies include: RUQ [name] (11/21), CT A/P (1/22), and EGD 1/22 have been unrevealing, negative H. pylori, and normal gastric emptying study in 05/2020 excludes gastroparesis. During previous admissions, including most recent, symptoms have been controlled with IV Reglan 10 mg Q6h, and discharge within 1-2 days. Home CBG readings recently in mid 100's per patient with most recent A1c 6.9 on 01/21/21 (A1c's previously ranging from 8-12 prior to ESRD state in 05/2019, and was as high as 8.8 this past October). Will repeat A1c today. Patient is afebrile  today and given absence of infectious symptoms, neg UA concerning for UTI, and similar presentation to previous admissions suspect she has worsening gastroparesis 2/2 to uncontrolled diabetes. She was also discharged only two days ago with similar presentation and stated she felt as though nausea had not fully resolved at that time, could be acute worsening of previous episode if truly not fully resolved during that admission. Do not suspect cannabis hyperemesis  syndrome at this time given she reports not using cannabis. Continues to not feel well after receiving IV Reglan 10 mg in ED. Given mild prolongation of QTc to 478 in most recent EKG will hold IV Reglan. - NPO 2/2 concern for DKA above - Zofran 4mg  prn - metoclopramide 5 with meals - Ativan 0.5mg  Q6h prn if uncontrolled with Zofran 2/2 QTC - CMP daily   ESRD on PD On PD since 05/2020. Last completed PD yesterday night. Consult placed to nephro to arrange PD. Currently undergoing kidney transplant evaluation of [facility].  - continue PD - Gentle fluids as above     Hypokalemia K 3.3, will gently replete given patient's ESRD status. - supplementing with IV Kcl 70mEq - Trend BMP   HTN Hypertensive on admission to 164/91. Has not been able to take/keep down medications since yesterday. Home regimen includes: amlodipine 10 mg daily, Coreg 25 mg BID, and Hydralazine 50 TID - restarting po home meds - adding IV prn hydralazine 5mg   Depression -continue home prozac 20  Fluids: 75/hr NaCl, 5% dextrose, insulin Code: Full VTE: heparin  Dispo: Admit patient to Observation with expected length of stay less than 2 midnights.  Signed: Tyna Jaksch, Medical Student 03/25/2021, 2:27 PM    Attestation for Student Documentation:  I personally was present and performed or re-performed the history, physical exam and medical decision-making activities of this service and have verified that the service and findings are accurately  documented in the students note.  Scarlett Presto, MD 03/25/2021, 5:46 PM Pager: [phone]

## 2021-03-25 NOTE — Progress Notes (Addendum)
Inpatient Diabetes Program Recommendations ? ?AACE/ADA: New Consensus Statement on Inpatient Glycemic Control (2015) ? ?Target Ranges:  Prepandial:   less than 140 mg/dL ?     Peak postprandial:   less than 180 mg/dL (1-2 hours) ?     Critically ill patients:  140 - 180 mg/dL  ? ?Lab Results  ?Component Value Date  ? GLUCAP 170 (H) 03/22/2021  ? HGBA1C 6.9 (A) 01/21/2021  ? ? ?Review of Glycemic Control ? Latest Reference Range & Units 03/25/21 09:13  ?Potassium 3.5 - 5.1 mmol/L 3.3 (L)  ?Chloride 98 - 111 mmol/L 95 (L)  ?CO2 22 - 32 mmol/L 18 (L)  ?Glucose 70 - 99 mg/dL 274 (H)  ?BUN 6 - 20 mg/dL 48 (H)  ?Creatinine 0.44 - 1.00 mg/dL 9.95 (H)  ?Calcium 8.9 - 10.3 mg/dL 10.0  ?Anion gap 5 - 15  21 (H)  ?(L): Data is abnormally low ?(H): Data is abnormally high ? ?Diabetes history: DM1 ?Outpatient Diabetes medications: Lantus 20 units qd, Humalog 3 units tid ?Current orders for Inpatient glycemic control: IV insulin to be started ? ?Inpatient Diabetes Program Recommendations:   ?Patient has type 1 diabetes and sees Dr. Loanne Drilling for endocrinology with last visit 01/21/21. ?Spoke with RN William Hamburger [place] to request new [facility] Lab BG @ 9:13 am. ?Agree with IV insulin and will follow patient during hospitalization. ?Noted K+ 3.3. Secure text sent to Dr. Jimmye Norman. ? ?Diabetes coordinator spoke with patient on prior admission 02/24/21 and reviewed sick day rules. ? ?Thank you, ?Nani Gasser Kye Silverstein, RN, MSN, CDE  ?Diabetes Coordinator ?Inpatient Glycemic Control Team ?Team Pager [phone] (8am-5pm) ?03/25/2021 3:04 PM ? ? ? ? ? ?

## 2021-03-25 NOTE — ED Provider Triage Note (Signed)
Emergency Medicine Provider Triage Evaluation Note ? ?Barbara Donovan , a 31 y.o. female  was evaluated in triage.  Pt complains of emesis.  Symptoms began last night.  She was discharged from hospital stay 3 days ago for gastroparesis and was asymptomatic until last night.  Minimal improvement noted with home Phenergan.  Denies any abdominal pain, diarrhea.  She completes peritoneal dialysis on a nightly basis and has not missed any sessions.  Last dose of Phenergan was last night. ? ?Review of Systems  ?Positive: Emesis ?Negative: Abdominal pain, diarrhea ? ?Physical Exam  ?BP (!) 194/123 (BP Location: Left Arm)   Pulse (!) 109   Temp 98.5 ?F (36.9 ?C) (Oral)   Resp 14   SpO2 99%  ?Gen:   Awake, no distress   ?Resp:  Normal effort  ?MSK:   Moves extremities without difficulty  ?Other:   ? ?Medical Decision Making  ?Medically screening exam initiated at 9:18 AM.  Appropriate orders placed.  CAMIE HAUSS was informed that the remainder of the evaluation will be completed by another provider, this initial triage assessment does not replace that evaluation, and the importance of remaining in the ED until their evaluation is complete. ? ?Will obtain labs ?  ?Delia Heady, PA-C ?03/25/21 1950 ? ?

## 2021-03-25 NOTE — Progress Notes (Signed)
[facility] ?Progress Note ? ?Subjective: pt back to ED (was her on 3/05) w/ refractory N/V and BS 270 w/ +ketones in the blood. Will get IV insulin protocol. Asked to see for ESRD.   ? ?Pt seen in room. She started HD last year 2022 and then transitioned to PD about 3-4 mos ago.  No recent PD problems.   ? ?Vitals:  ? 03/25/21 0900 03/25/21 1047 03/25/21 1330 03/25/21 1510  ?BP: (!) 194/123 (!) 191/104 (!) 164/91 (!) 178/98  ?Pulse: (!) 109 (!) 106 88 (!) 108  ?Resp: 14 18 18  (!) 25  ?Temp: 98.5 ?F (36.9 ?C)   98.2 ?F (36.8 ?C)  ?TempSrc: Oral   Oral  ?SpO2: 99% 100% 97% 100%  ? ? ?Exam: ?Gen alert, no distress, chronically ill appearing ?No rash, cyanosis or gangrene ?Sclera anicteric, throat clear  ?No jvd or bruits ?Chest clear bilat to bases, no rales/ wheezing ?RRR no RG ?Abd soft ntnd no mass or ascites +bs, PD cath midline clean exit ?GU deferred ?MS no joint effusions or deformity ?Ext no LE or UE edema, no wounds or ulcers ?Neuro is alert, Ox 3 , a bit lethargic ?  ? ?   Na 134  K 3.3  CO2 18  BUN 48  Cr 9.9  CO2 18  AG 21 ? ? OP PD: 2 L x 5 overnight, no daybag or pause, 1.5h dwell, edw 63kg ? ? ?Assessment/ Plan: ?Refractory N/V - per pmd ?DM1 - w/ possible DKA, BS 270 w/ +ketones, AG 21. IV insulin protocol started per pmd. ESRD pt's don't need saline-based volume expansion for treatment of HHS/ DKA; when IV dextrose is needed please use D5W (no saline) at 40- 80 cc/hr.   ?ESRD - on CCPD. PD orders written for tonight. All 1.5%.  ?Volume - looks dry. BP's high. Get weights when on the floor. Will give NS at 75 cc/hr for dehydration Hardie Lora, Richardson Landry).  ?Anemia ckd - tx if Hb < 7. Hb 13 here, no esa needs ?MBD ckd - get records ? ? ? ? ?Rob Doctor, hospital ?03/25/2021, 3:32 PM ? ? ?Recent Labs  ?Lab 03/22/21 ?0436 03/25/21 ?0737 03/25/21 ?1120  ?K 3.5 3.3* 3.1*  ?BUN 57* 48*  --   ?CREATININE 10.60* 9.95*  --   ?ALBUMIN 2.9* 3.8  --   ?CALCIUM 8.8* 10.0  --   ?HGB 10.0* 13.1 12.9  ? ?Inpatient  medications: ? heparin  5,000 Units Subcutaneous Q8H  ? metoCLOPramide  5 mg Oral TID AC  ? ? dextrose    ? insulin    ? potassium chloride    ? ?dextrose, ondansetron **OR** ondansetron (ZOFRAN) IV ? ? ? ? ? ? ? ?

## 2021-03-25 NOTE — Hospital Course (Addendum)
? ? ?  ____________________________________ ? ?Coming in for the same thing. ? ?Has been having nausea after discharge 2 days ago and started vomiting yesterday. It feels the same as last time. Does not feel like she got better after last time. Has been using reglan since yesterday (prescribed at discharge), but no help. Has been using phenergan but has not helped with nausea at all. ? ?Has been able to take her home diabetes medications. ? ?Was able to perform PD yesterday with no problems. Does make some urine, no burning. ? ?Denies any fevers, constipation, diarrhea, abdominal pain, dizziness, lightheadedness, headaches, SHOB. ? ?Last BM yesterday, was normal. ? ?Has been drinking water, but not able to keep down any PO intake. ? ?Feels like she is getting worse. ? ?No marijuana or other recreational drug use. ?

## 2021-03-25 NOTE — ED Notes (Signed)
Pt refused to get taken to bathroom for urine sample, has urine cup  ?

## 2021-03-26 DIAGNOSIS — R112 Nausea with vomiting, unspecified: Secondary | ICD-10-CM

## 2021-03-26 DIAGNOSIS — K3184 Gastroparesis: Secondary | ICD-10-CM | POA: Diagnosis not present

## 2021-03-26 DIAGNOSIS — E1043 Type 1 diabetes mellitus with diabetic autonomic (poly)neuropathy: Secondary | ICD-10-CM

## 2021-03-26 DIAGNOSIS — F32A Depression, unspecified: Secondary | ICD-10-CM

## 2021-03-26 DIAGNOSIS — R9431 Abnormal electrocardiogram [ECG] [EKG]: Secondary | ICD-10-CM

## 2021-03-26 DIAGNOSIS — E1065 Type 1 diabetes mellitus with hyperglycemia: Secondary | ICD-10-CM

## 2021-03-26 DIAGNOSIS — E876 Hypokalemia: Secondary | ICD-10-CM

## 2021-03-26 DIAGNOSIS — Z992 Dependence on renal dialysis: Secondary | ICD-10-CM

## 2021-03-26 DIAGNOSIS — N186 End stage renal disease: Secondary | ICD-10-CM

## 2021-03-26 DIAGNOSIS — I120 Hypertensive chronic kidney disease with stage 5 chronic kidney disease or end stage renal disease: Secondary | ICD-10-CM

## 2021-03-26 DIAGNOSIS — E1022 Type 1 diabetes mellitus with diabetic chronic kidney disease: Secondary | ICD-10-CM

## 2021-03-26 LAB — BASIC METABOLIC PANEL
Anion gap: 13 (ref 5–15)
Anion gap: 16 — ABNORMAL HIGH (ref 5–15)
Anion gap: 13 (ref 5–15)
BUN: 42 mg/dL — ABNORMAL HIGH (ref 6–20)
BUN: 39 mg/dL — ABNORMAL HIGH (ref 6–20)
BUN: 37 mg/dL — ABNORMAL HIGH (ref 6–20)
CO2: 21 mmol/L — ABNORMAL LOW (ref 22–32)
CO2: 20 mmol/L — ABNORMAL LOW (ref 22–32)
CO2: 22 mmol/L (ref 22–32)
Calcium: 9 mg/dL (ref 8.9–10.3)
Calcium: 9.2 mg/dL (ref 8.9–10.3)
Calcium: 9.1 mg/dL (ref 8.9–10.3)
Chloride: 103 mmol/L (ref 98–111)
Chloride: 102 mmol/L (ref 98–111)
Chloride: 101 mmol/L (ref 98–111)
Creatinine, Ser: 9.39 mg/dL — ABNORMAL HIGH (ref 0.44–1.00)
Creatinine, Ser: 9.25 mg/dL — ABNORMAL HIGH (ref 0.44–1.00)
Creatinine, Ser: 9.24 mg/dL — ABNORMAL HIGH (ref 0.44–1.00)
GFR, Estimated: 5 mL/min — ABNORMAL LOW (ref >60)
GFR, Estimated: 5 mL/min — ABNORMAL LOW (ref >60)
GFR, Estimated: 5 mL/min — ABNORMAL LOW (ref >60)
Glucose, Bld: 120 mg/dL — ABNORMAL HIGH (ref 70–99)
Glucose, Bld: 146 mg/dL — ABNORMAL HIGH (ref 70–99)
Glucose, Bld: 99 mg/dL (ref 70–99)
Potassium: 3.2 mmol/L — ABNORMAL LOW (ref 3.5–5.1)
Potassium: 3 mmol/L — ABNORMAL LOW (ref 3.5–5.1)
Potassium: 3.4 mmol/L — ABNORMAL LOW (ref 3.5–5.1)
Sodium: 137 mmol/L (ref 135–145)
Sodium: 138 mmol/L (ref 135–145)
Sodium: 136 mmol/L (ref 135–145)

## 2021-03-26 LAB — GLUCOSE, CAPILLARY
Glucose-Capillary: 113 mg/dL — ABNORMAL HIGH (ref 70–99)
Glucose-Capillary: 118 mg/dL — ABNORMAL HIGH (ref 70–99)
Glucose-Capillary: 124 mg/dL — ABNORMAL HIGH (ref 70–99)
Glucose-Capillary: 132 mg/dL — ABNORMAL HIGH (ref 70–99)
Glucose-Capillary: 136 mg/dL — ABNORMAL HIGH (ref 70–99)
Glucose-Capillary: 141 mg/dL — ABNORMAL HIGH (ref 70–99)
Glucose-Capillary: 143 mg/dL — ABNORMAL HIGH (ref 70–99)
Glucose-Capillary: 144 mg/dL — ABNORMAL HIGH (ref 70–99)
Glucose-Capillary: 150 mg/dL — ABNORMAL HIGH (ref 70–99)
Glucose-Capillary: 172 mg/dL — ABNORMAL HIGH (ref 70–99)
Glucose-Capillary: 221 mg/dL — ABNORMAL HIGH (ref 70–99)
Glucose-Capillary: 85 mg/dL (ref 70–99)

## 2021-03-26 LAB — BETA-HYDROXYBUTYRIC ACID: Beta-Hydroxybutyric Acid: 0.15 mmol/L (ref 0.05–0.27)

## 2021-03-26 MED ORDER — DELFLEX-LC/2.5% DEXTROSE 394 MOSM/L IP SOLN
INTRAPERITONEAL | Status: DC
  Administered 2021-03-26: 19:00:00 5000 mL via INTRAPERITONEAL

## 2021-03-26 MED ORDER — POTASSIUM CHLORIDE 10 MEQ/100ML IV SOLN
10.0000 meq | INTRAVENOUS | Status: AC
  Administered 2021-03-26 (×2): 10 meq via INTRAVENOUS
  Filled 2021-03-26 (×2): qty 100

## 2021-03-26 MED ORDER — POTASSIUM CHLORIDE 10 MEQ/100ML IV SOLN
10.0000 meq | INTRAVENOUS | Status: AC
  Administered 2021-03-26 (×3): 10 meq via INTRAVENOUS
  Filled 2021-03-26 (×3): qty 100

## 2021-03-26 MED ORDER — INSULIN GLARGINE-YFGN 100 UNIT/ML ~~LOC~~ SOLN
8.0000 [IU] | Freq: Every day | SUBCUTANEOUS | Status: DC
Start: 2021-03-26 — End: 2021-03-27
  Administered 2021-03-26: 14:00:00 8 [IU] via SUBCUTANEOUS
  Filled 2021-03-26 (×2): qty 0.08

## 2021-03-26 MED ORDER — INSULIN ASPART 100 UNIT/ML IJ SOLN
3.0000 [IU] | Freq: Three times a day (TID) | INTRAMUSCULAR | Status: DC
  Administered 2021-03-26 – 2021-03-27 (×2): 3 [IU] via SUBCUTANEOUS

## 2021-03-26 MED ORDER — INSULIN ASPART 100 UNIT/ML IJ SOLN
0.0000 [IU] | Freq: Three times a day (TID) | INTRAMUSCULAR | Status: DC
  Administered 2021-03-27: 1 [IU] via SUBCUTANEOUS

## 2021-03-26 MED ORDER — INSULIN ASPART 100 UNIT/ML IJ SOLN: 0.0000 [IU] | Freq: Three times a day (TID) | INTRAMUSCULAR | Status: DC

## 2021-03-26 MED ORDER — DELFLEX-LC/1.5% DEXTROSE 344 MOSM/L IP SOLN: INTRAPERITONEAL | Status: DC

## 2021-03-26 NOTE — Progress Notes (Incomplete)
° ° °  HD#0 Subjective:  Overnight Events: NAEO   ***  Objective:  Vital signs in last 24 hours: Vitals:   03/26/21 0308 03/26/21 0500 03/26/21 0800 03/26/21 1130  BP: 121/68  (!) 163/95   Pulse: 81  90   Resp: 14 20 13    Temp: 98.5 F (36.9 C)   98.6 F (37 C)  TempSrc: Oral   Oral  SpO2: 97%     Weight:       Supplemental O2: {NAMES:3044014::"Room Air","Nasal Cannula","Simple Face Mask","Partial Rebreather","HFNC","Non Rebreather","Venturi Mask","Bag Valve Mask"} SpO2: 97 %   Physical Exam:  Constitutional: well-appearing *** sitting in ***, in no acute distress HEENT: normocephalic atraumatic, mucous membranes moist ***, conjunctiva non-erythematous Cardiovascular: regular rate and rhythm, no m/r/g Pulmonary/Chest: normal work of breathing on room air, lungs clear to auscultation bilaterally *** Abdominal: soft, non-tender, non-distended MSK: normal bulk and tone Neurological: alert & oriented x 3, answering questions appropriately Skin: warm and dry Psych: ***  Filed Weights   03/25/21 1932  Weight: 65.2 kg     Intake/Output Summary (Last 24 hours) at 03/26/2021 1347 Last data filed at 03/25/2021 1942 Gross per 24 hour  Intake 240 ml  Output 300 ml  Net -60 ml   Net IO Since Admission: -60 mL [03/26/21 1347]  Pertinent Labs: CBC Latest Ref Rng & Units 03/25/2021 03/25/2021 03/22/2021  WBC 4.0 - 10.5 K/uL - 5.2 5.4  Hemoglobin 12.0 - 15.0 g/dL 12.9 13.1 10.0(L)  Hematocrit 36.0 - 46.0 % 38.0 40.5 30.7(L)  Platelets 150 - 400 K/uL - 413(H) 305    CMP Latest Ref Rng & Units 03/26/2021 03/26/2021 03/26/2021  Glucose 70 - 99 mg/dL 99 146(H) 120(H)  BUN 6 - 20 mg/dL 37(H) 39(H) 42(H)  Creatinine 0.44 - 1.00 mg/dL 9.24(H) 9.25(H) 9.39(H)  Sodium 135 - 145 mmol/L 136 138 137  Potassium 3.5 - 5.1 mmol/L 3.4(L) 3.0(L) 3.2(L)  Chloride 98 - 111 mmol/L 101 102 103  CO2 22 - 32 mmol/L 22 20(L) 21(L)  Calcium 8.9 - 10.3 mg/dL 9.1 9.2 9.0  Total Protein 6.5 - 8.1 g/dL - - -   Total Bilirubin 0.3 - 1.2 mg/dL - - -  Alkaline Phos 38 - 126 U/L - - -  AST 15 - 41 U/L - - -  ALT 0 - 44 U/L - - -    Imaging: No results found.  Assessment/Plan:   Principal Problem:   Intractable nausea and vomiting Active Problems:   Chronic hypertension   Type 1 diabetes mellitus with hyperglycemia (HCC)   ESRD (end stage renal disease) on PD   Increased anion gap metabolic acidosis   Diabetic gastroparesis associated with type 1 diabetes mellitus ([place])   Patient Summary: Barbara Donovan is a 31 y.o. with a pertinent PMH of ***, who presented with *** and admitted for ***.    *** ***  *** ***  *** ***  *** ***  Diet: {NAMES:3044014::"Normal","Heart Healthy","Carb-Modified","Renal","Carb/Renal","NPO","TPN","Tube Feeds"} IVF: {NAMES:3044014::"None","NS","1/2 NS","LR","D5","D10"},{NAMES:3044014::"None","10cc/hr","25cc/hr","50cc/hr","75cc/hr","100cc/hr","110cc/hr","125cc/hr","Bolus"} VTE: {NAMES:3044014::"Heparin","Enoxaparin","SCDs","NOAC","None"} Code: {NAMES:3044014::"Full","DNR","DNI","DNR/DNI","Comfort Care","Unknown"} Anticipated Discharge Location: *** Barriers to Discharge: ***  Scarlett Presto, MD Internal Medicine Resident PGY-1 Pager [phone] Please contact the on call pager after 5 pm and on weekends at [phone].

## 2021-03-26 NOTE — Progress Notes (Signed)
Inpatient Diabetes Program Recommendations ? ?AACE/ADA: New Consensus Statement on Inpatient Glycemic Control (2015) ? ?Target Ranges:  Prepandial:   less than 140 mg/dL ?     Peak postprandial:   less than 180 mg/dL (1-2 hours) ?     Critically ill patients:  140 - 180 mg/dL  ? ?Lab Results  ?Component Value Date  ? GLUCAP 141 (H) 03/26/2021  ? HGBA1C 7.6 (H) 03/25/2021  ? ? Latest Reference Range & Units 03/26/21 06:07  ?CO2 22 - 32 mmol/L 20 (L)  ?Glucose 70 - 99 mg/dL 146 (H)  ?BUN 6 - 20 mg/dL 39 (H)  ?Creatinine 0.44 - 1.00 mg/dL 9.25 (H)  ?Calcium 8.9 - 10.3 mg/dL 9.2  ?Anion gap 5 - 15  16 (H)  ?(L): Data is abnormally low ?(H): Data is abnormally high ? ?Review of Glycemic Control ? ?Diabetes history: DM1 ?Outpatient Diabetes medications: Lantus 20 units qd, Humalog 3 units tid ?Current orders for Inpatient glycemic control: IV insulin  ? ?Inpatient Diabetes Program Recommendations:   ?Noted patient is NPO. RN states patient's nausea is improved and requesting to eat and drink. ? ?If patient not able to eat, please continue IV insulin and consider adding D10. Will follow during hospitalization. ? ?Thank you, ?Nani Gasser Stamatia Masri, RN, MSN, CDE  ?Diabetes Coordinator ?Inpatient Glycemic Control Team ?Team Pager [phone] (8am-5pm) ?03/26/2021 8:58 AM ? ? ? ? ?

## 2021-03-26 NOTE — Progress Notes (Signed)
Spoke with Dr. Clover Mealy in hemodialysis regarding dialysis treatment to be done in the room.  Phone representative stated to pass the message along. Peritoneal dialysis to be done later this evening.   ?

## 2021-03-26 NOTE — Progress Notes (Incomplete Revision)
Subjective:  Patient states she is doing much better today. No complaints of nausea or vomiting today. No issues with urination, denies dysuria. Is hungry and would like something to eat.  Objective:  Vital signs in last 24 hours: Vitals:   03/26/21 0308 03/26/21 0500 03/26/21 0800 03/26/21 1130  BP: 121/68  (!) 163/95   Pulse: 81  90   Resp: 14 20 13    Temp: 98.5 F (36.9 C)   98.6 F (37 C)  TempSrc: Oral   Oral  SpO2: 97%     Weight:       Weight change:   Intake/Output Summary (Last 24 hours) at 03/26/2021 1420 Last data filed at 03/25/2021 1942 Gross per 24 hour  Intake 240 ml  Output 300 ml  Net -60 ml   Constitutional: well-appearing young female, in no acute distress HEENT: atraumatic, normocephalic  Cardio: normal rate Pulm: no increased work of breathing on room air Abdominal: soft, non-distended MSK: normal tone and bulk, moving all extremities spontaneously Skin: warm and dry, no observable rashes Neuro: alert and oriented x3, answering questions appropriately Psych: normal affect  Assessment/Plan: Barbara Donovan is a 31yo female with a history of T1DM, ESRD on PD since May 2022, HTN, and frequent hospitalizations for intractable N/V (13 visits within the past 6 months requiring tx with IV Reglan q6h and IVF) presenting today with intractable N/V and poor PO intake x 2 days likely secondary to gastroparesis due to uncontrolled T1DM.  Principal Problem:   Intractable nausea and vomiting Active Problems:   Chronic hypertension   Type 1 diabetes mellitus with hyperglycemia (HCC)   ESRD (end stage renal disease) on PD   Increased anion gap metabolic acidosis   Diabetic gastroparesis associated with type 1 diabetes mellitus ([place])  Acute on chronic Intractable N/V Diabetic gastroparesis associated with type 1 DM Prolonged QTc Patient sx much improved today on current regimen. Patient with appetite and wanting to eat, switch from NPO to normal diet. Given  extensive negative work up (see previous note), reaching out to patients endocrine provider to determine level of compliance to medications and role of diabetes in continued presentation might give [name] more information. Patient remains afebrile today and continues to deny infectious sx. Repeat Hgb A1c 7.6, leading diagnosis still worsening gastroparesis 2/2 to uncontrolled diabetes. Mild prolongation of QTc to 478 in most recent EKG, stable and unchanged after antiemetics.  Doing well on IV Reglan 5 mg. Given her recent discharge and bounce back will observe her for another night to make sure her symptoms continue improving - Zofran 4mg  prn - metoclopramide 5 with meals - Ativan 0.5mg  Q6h prn if uncontrolled with Zofran 2/2 QTC - carb modified diet  Type 1 DM with hyperglycemia Increased anion gap metabolic acidosis concerning for DKA; resolved Patient has been transferred off of the endotool and is tolerating PO medications now. Have given a reduced dose of her home long acting just 8 (from home 10) just in case she starts vomiting again. Will also restart 3 units with meals in addition to sliding scale.   - start insulin glargine 8 units subq daily with sliding scale - daily BMP - d/c endotool - regular diet   ESRD on PD On PD since 05/2020. Nephro following to arrange PD, continuing PD today. Currently undergoing kidney transplant evaluation of [facility].  - continue PD - Gentle fluids as above     Hypokalemia K has been mildly low, supplementing gently given ESRD and difficulty removing  K in these patients - supplementing with IV Kcl 68mEq again x2 - recheck BMP in AM   HTN Patient remaining normotensive overnight. BP this AM up to 163/95, check done before morning meds dosed, continue on current regimen . Home regimen includes: amlodipine 10 mg daily, Coreg 25 mg BID, and Hydralazine 50 TID - continue home meds - IV prn hydralazine 5mg    Depression -continue home prozac 20  VTE:  heparin Code: Full IVF: None    LOS: 0 days   Barbara Donovan, Medical Student 03/26/2021, 2:20 PM  Attestation for Student Documentation:  I personally was present and performed or re-performed the history, physical exam and medical decision-making activities of this service and have verified that the service and findings are accurately documented in the students note.  Scarlett Presto, MD 03/26/2021, 3:01 PM

## 2021-03-26 NOTE — Progress Notes (Addendum)
? ?Subjective: ? ?Patient states she is doing much better today. No complaints of nausea or vomiting today. No issues with urination, denies dysuria. Is hungry and would like something to eat. ? ?Objective: ? ?Vital signs in last 24 hours: ?Vitals:  ? 03/26/21 0308 03/26/21 0500 03/26/21 0800 03/26/21 1130  ?BP: 121/68  (!) 163/95   ?Pulse: 81  90   ?Resp: 14 20 13    ?Temp: 98.5 ?F (36.9 ?C)   98.6 ?F (37 ?C)  ?TempSrc: Oral   Oral  ?SpO2: 97%     ?Weight:      ? ?Weight change:  ? ?Intake/Output Summary (Last 24 hours) at 03/26/2021 1420 ?Last data filed at 03/25/2021 1942 ?Gross per 24 hour  ?Intake 240 ml  ?Output 300 ml  ?Net -60 ml  ? ?Constitutional: well-appearing young female, in no acute distress ?HEENT: atraumatic, normocephalic  ?Cardio: normal rate ?Pulm: no increased work of breathing on room air ?Abdominal: soft, non-distended ?MSK: normal tone and bulk, moving all extremities spontaneously ?Skin: warm and dry, no observable rashes ?Neuro: alert and oriented x3, answering questions appropriately ?Psych: normal affect ? ?Assessment/Plan: ?Ms Wayment is a 31yo female with a history of T1DM, ESRD on PD since May 2022, HTN, and frequent hospitalizations for intractable N/V (13 visits within the past 6 months requiring tx with IV Reglan q6h and IVF) presenting today with intractable N/V and poor PO intake x 2 days likely secondary to gastroparesis due to uncontrolled T1DM. ? ?Principal Problem: ?  Intractable nausea and vomiting ?Active Problems: ?  Chronic hypertension ?  Type 1 diabetes mellitus with hyperglycemia (HCC) ?  ESRD (end stage renal disease) on PD ?  Increased anion gap metabolic acidosis ?  Diabetic gastroparesis associated with type 1 diabetes mellitus ([place]) ? ?Acute on chronic Intractable N/V ?Diabetic gastroparesis associated with type 1 DM ?Prolonged QTc ?Patient sx much improved today on current regimen. Patient with appetite and wanting to eat, switch from NPO to normal diet. Given  extensive negative work up (see previous note), reaching out to patients endocrine provider to determine level of compliance to medications and role of diabetes in continued presentation might give [name] more information. Patient remains afebrile today and continues to deny infectious sx. Repeat Hgb A1c 7.6, leading diagnosis still worsening gastroparesis 2/2 to uncontrolled diabetes. Mild prolongation of QTc to 478 in most recent EKG, stable and unchanged after antiemetics.  Doing well on IV Reglan 5 mg. Given her recent discharge and bounce back will observe her for another night to make sure her symptoms continue improving ?- Zofran 4mg  prn ?- metoclopramide 5 with meals ?- Ativan 0.5mg  Q6h prn if uncontrolled with Zofran 2/2 QTC ?- carb modified diet ? ?Type 1 DM with hyperglycemia ?Increased anion gap metabolic acidosis concerning for DKA; resolved ?Patient has been transferred off of the endotool and is tolerating PO medications now. Have given a reduced dose of her home long acting just 8 (from home 10) just in case she starts vomiting again. Will also restart 3 units with meals in addition to sliding scale.   ?- start insulin glargine 8 units subq daily with sliding scale ?- daily BMP ?- d/c endotool ?- regular diet ?  ?ESRD on PD ?On PD since 05/2020. Nephro following to arrange PD, continuing PD today. Currently undergoing kidney transplant evaluation of [facility].  ?- continue PD ?- Gentle fluids as above  ?   ?Hypokalemia ?K has been mildly low, supplementing gently given ESRD and difficulty removing  K in these patients ?- supplementing with IV Kcl 12mEq again x2 ?- recheck BMP in AM ?  ?HTN ?Patient remaining normotensive overnight. BP this AM up to 163/95, check done before morning meds dosed, continue on current regimen . Home regimen includes: amlodipine 10 mg daily, Coreg 25 mg BID, and Hydralazine 50 TID ?- continue home meds ?- IV prn hydralazine 5mg  ?  ?Depression ?-continue home prozac 20 ? ?VTE:  heparin ?Code: Full ?IVF: None ? ? ? LOS: 0 days  ? ?Rodriguez-Teodoro, Quintella Reichert, Medical Student ?03/26/2021, 2:20 PM  ?Attestation for Student Documentation: ? ?I personally was present and performed or re-performed the history, physical exam and medical decision-making activities of this service and have verified that the service and findings are accurately documented in the student?s note. ? ?Scarlett Presto, MD ?03/26/2021, 3:01 PM ? ?

## 2021-03-26 NOTE — Progress Notes (Signed)
[facility] ?Progress Note ? ?Subjective: feeling much better today, nausea improved. Got PD overnight.  ? ?Vitals:  ? 03/26/21 0308 03/26/21 0500 03/26/21 0800 03/26/21 1130  ?BP: 121/68  (!) 163/95   ?Pulse: 81  90   ?Resp: 14 20 13    ?Temp: 98.5 ?F (36.9 ?C)   98.6 ?F (37 ?C)  ?TempSrc: Oral   Oral  ?SpO2: 97%     ?Weight:      ? ? ?Exam: ?Gen alert, no distress ?No jvd or bruits ?Chest clear bilat to bases ?RRR no RG ?Abd soft ntnd no mass or ascites +bs, PD cath midline clean exit ?Ext no LE  edema ?Neuro is alert, Ox 3, nonfocal ?  ? ? OP PD: 2 L x 5 overnight, no daybag or pause, 1.5h dwell, edw 63kg ? ? ?Assessment/ Plan: ?DM1 - w/ suspected DKA, BS 270 w/ +ketones, AG 21. Much better today after IV insulin, anti-emetics. Per pmd will transition back to SQ insulin.  ?Hypovolemia - looks better, euvolemic, 1-2 kg over dry wt. Will dc NS at 75 cc/hr.  ?ESRD - on CCPD. Plan PD tonight w/ 1/2 1.5% and 1/2 2.5% fluid ?Anemia ckd - tx if Hb < 7. 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 Binders when eating.  ?Hypokalemia - getting replacement ? ? ? ? ?Barbara Donovan ?03/26/2021, 3:02 PM ? ? ?Recent Labs  ?Lab 03/25/21 ?1638 03/25/21 ?1120 03/25/21 ?1817 03/25/21 ?2134 03/26/21 ?4665 03/26/21 ?1002  ?K 3.3* 3.1* 3.3*   < > 3.0* 3.4*  ?BUN 48*  --  49*   < > 39* 37*  ?CREATININE 9.95*  --  9.52*   < > 9.25* 9.24*  ?ALBUMIN 3.8  --  3.3*  --   --   --   ?CALCIUM 10.0  --  9.4   < > 9.2 9.1  ?PHOS  --   --  7.3*  --   --   --   ?HGB 13.1 12.9  --   --   --   --   ? < > = values in this interval not displayed.  ? ? ?Inpatient medications: ? amLODipine  10 mg Oral Daily  ? carvedilol  25 mg Oral BID  ? FLUoxetine  20 mg Oral Daily  ? gentamicin cream  1 application. Topical Daily  ? heparin  5,000 Units Subcutaneous Q8H  ? hydrALAZINE  50 mg Oral TID  ? insulin aspart  0-6 Units Subcutaneous TID WC  ? insulin glargine-yfgn  8 Units Subcutaneous Daily  ? metoCLOPramide (REGLAN) injection  5 mg  Intravenous Q6H  ? ? sodium chloride 75 mL/hr at 03/26/21 0456  ? dextrose 50 mL/hr at 03/25/21 2043  ? dialysis solution 1.5% low-MG/low-CA    ? insulin 1 Units/hr (03/26/21 1348)  ? ?acetaminophen, acetaminophen, dextrose, hydrALAZINE, LORazepam, ondansetron **OR** ondansetron (ZOFRAN) IV ? ? ? ? ? ? ? ?

## 2021-03-27 DIAGNOSIS — E1043 Type 1 diabetes mellitus with diabetic autonomic (poly)neuropathy: Secondary | ICD-10-CM | POA: Diagnosis not present

## 2021-03-27 LAB — BASIC METABOLIC PANEL
Anion gap: 13 (ref 5–15)
BUN: 40 mg/dL — ABNORMAL HIGH (ref 6–20)
CO2: 20 mmol/L — ABNORMAL LOW (ref 22–32)
Calcium: 9 mg/dL (ref 8.9–10.3)
Chloride: 101 mmol/L (ref 98–111)
Creatinine, Ser: 9.41 mg/dL — ABNORMAL HIGH (ref 0.44–1.00)
GFR, Estimated: 5 mL/min — ABNORMAL LOW (ref >60)
Glucose, Bld: 178 mg/dL — ABNORMAL HIGH (ref 70–99)
Potassium: 3.6 mmol/L (ref 3.5–5.1)
Sodium: 134 mmol/L — ABNORMAL LOW (ref 135–145)

## 2021-03-27 LAB — BETA-HYDROXYBUTYRIC ACID: Beta-Hydroxybutyric Acid: 0.17 mmol/L (ref 0.05–0.27)

## 2021-03-27 LAB — GLUCOSE, CAPILLARY: Glucose-Capillary: 170 mg/dL — ABNORMAL HIGH (ref 70–99)

## 2021-03-27 MED ORDER — CONTINUOUS GLUCOSE MONITOR SUP MISC: 1.0000 [IU] | Freq: Three times a day (TID) | 0 refills | Status: AC

## 2021-03-27 MED ORDER — INSULIN GLARGINE-YFGN 100 UNIT/ML ~~LOC~~ SOLN
10.0000 [IU] | Freq: Every day | SUBCUTANEOUS | Status: DC
  Administered 2021-03-27: 10 [IU] via SUBCUTANEOUS
  Filled 2021-03-27: qty 0.1

## 2021-03-27 MED ORDER — CONTINUOUS BLOOD GLUC RECEIVER DEVI
1.0000 [IU] | Freq: Three times a day (TID) | 0 refills | Status: DC
Start: 2021-03-27 — End: 2023-01-16

## 2021-03-27 MED ORDER — DELFLEX-LC/2.5% DEXTROSE 394 MOSM/L IP SOLN: INTRAPERITONEAL | Status: DC

## 2021-03-27 NOTE — Discharge Instructions (Addendum)
Barbara Donovan ? ?You were recently admitted to [facility] for nausea and vomiting. We gave you some IV fluids and medications to help with your vomiting.  ? ?Continue taking your home medications and use your reglan and phenergan as needed to help with your nausea. ? ?You should seek further medical care if you have further N/V, diarrhea, fever or chills, or lightheadedness. ? ?We recommend that you see [name] in clinic in about a week to make sure that you continue to improve. We are so glad that you are feeling better. ? ?Sincerely, ?Scarlett Presto, MD ? ? ?

## 2021-03-27 NOTE — Plan of Care (Signed)

## 2021-03-27 NOTE — Progress Notes (Addendum)
[facility] ?Progress Note ? ?Subjective: seen in room, good appetite, feeling better ? ?Vitals:  ? 03/27/21 0355 03/27/21 0653 03/27/21 0746 03/27/21 1029  ?BP: 105/61  (!) 162/93   ?Pulse: 89  78   ?Resp: 15  12   ?Temp: 98.3 ?F (36.8 ?C)  (!) 97.4 ?F (36.3 ?C)   ?TempSrc: Oral  Oral   ?SpO2: 99%  99%   ?Weight:  66.5 kg  68.5 kg  ? ? ?Exam: ?Gen alert, no distress ?No jvd or bruits ?Chest clear bilat to bases ?RRR no RG ?Abd soft ntnd no mass or ascites +bs, PD cath midline clean exit ?Ext no LE  edema ?Neuro is alert, Ox 3, nonfocal ?  ? ? OP PD: 2 L x 5 overnight, no daybag or pause, 1.5h dwell, edw 63kg ? ? ?Assessment/ Plan: ?DM1 - w/ suspected DKA, BS 270 w/ +ketones, AG 21. SP IV insulin, anti-emetics. Getting SQ insulin now per pmd.  ?ESRD - on CCPD.  ?BP/volume - no gross vol excess on exam ?Anemia ckd - tx if Hb < 7. 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 Binders when eating.  ?Hypokalemia - SP IV KCl, better  ?Dispo - for dc today ? ?Rob Chael Urenda ?03/27/2021, 11:09 AM ? ? ?Recent Labs  ?Lab 03/25/21 ?1478 03/25/21 ?1120 03/25/21 ?1817 03/25/21 ?2134 03/26/21 ?1002 03/27/21 ?0601  ?K 3.3* 3.1* 3.3*   < > 3.4* 3.6  ?BUN 48*  --  49*   < > 37* 40*  ?CREATININE 9.95*  --  9.52*   < > 9.24* 9.41*  ?ALBUMIN 3.8  --  3.3*  --   --   --   ?CALCIUM 10.0  --  9.4   < > 9.1 9.0  ?PHOS  --   --  7.3*  --   --   --   ?HGB 13.1 12.9  --   --   --   --   ? < > = values in this interval not displayed.  ? ? ?Inpatient medications: ? amLODipine  10 mg Oral Daily  ? carvedilol  25 mg Oral BID  ? FLUoxetine  20 mg Oral Daily  ? gentamicin cream  1 application. Topical Daily  ? heparin  5,000 Units Subcutaneous Q8H  ? hydrALAZINE  50 mg Oral TID  ? insulin aspart  0-6 Units Subcutaneous TID WC  ? insulin aspart  3 Units Subcutaneous TID WC  ? insulin glargine-yfgn  10 Units Subcutaneous Daily  ? metoCLOPramide (REGLAN) injection  5 mg Intravenous Q6H  ? ? dialysis solution 1.5% low-MG/low-CA  Stopped (03/26/21 1702)  ? dialysis solution 2.5% low-MG/low-CA    ? ?acetaminophen, acetaminophen, dextrose, hydrALAZINE, LORazepam, ondansetron **OR** ondansetron (ZOFRAN) IV ? ? ? ? ? ? ? ?

## 2021-03-27 NOTE — Progress Notes (Signed)
?  Transition of Care (TOC) Screening Note ? ? ?Patient Details  ?Name: Barbara Donovan ?Date of Birth: 09/14/1990 ? ? ?Transition of Care [facility]) CM/SW Contact:    ?Geralynn Ochs, LCSW ?Phone Number: ?03/27/2021, 11:22 AM ? ? ? ?Transition of Care Department [facility]) has reviewed patient and no TOC needs have been identified at this time. We will continue to monitor patient advancement through interdisciplinary progression rounds. If new patient transition needs arise, please place a TOC consult. ?  ?

## 2021-03-27 NOTE — Discharge Summary (Signed)
? ?Name: Barbara Donovan ?MRN: [id number] ?DOB: 1990/01/27 31 y.o. ?PCP: Scarlett Presto, MD ? ?Date of Admission: 03/25/2021  8:58 AM ?Date of Discharge: 03/27/2021  03/27/21 ?Attending Physician: No att. providers found ? ?Discharge Diagnosis: ?1. Acute on chronic Intractable N/V ?2. Diabetic gastroparesis associated with type 1 DM ?3. Increased anion gap metabolic acidosis concerning for DKA; resolved ?4. Prolonged QTc ?5. ESRD on PD ?6. Hypokalemia; resolved ?7. HTN ?8. Depression ? ?Discharge Medications: ?Allergies as of 03/27/2021   ?No Known Allergies ?  ? ?  ?Medication List  ?  ? ?STOP taking these medications   ? ?mirtazapine 7.5 MG tablet ?Commonly known as: REMERON ?  ? ?  ? ?TAKE these medications   ? ?amLODipine 10 MG tablet ?Commonly known as: NORVASC ?Take 1 tablet (10 mg total) by mouth daily. ?  ?brimonidine 0.2 % ophthalmic solution ?Commonly known as: ALPHAGAN ?Place 1 drop into the left eye 3 (three) times daily. ?  ?calcitRIOL 0.5 MCG capsule ?Commonly known as: ROCALTROL ?Take 0.5 mcg by mouth daily. ?  ?carvedilol 25 MG tablet ?Commonly known as: COREG ?Take 1 tablet (25 mg total) by mouth 2 (two) times daily. ?  ?Continuous Blood Gluc Receiver Devi ?1 Units by Does not apply route 4 (four) times daily -  before meals and at bedtime. May substitute for cheapest monitor with insurance ?  ?Continuous Blood Gluc Sensor Misc ?1 each by Does not apply route as directed. Use as directed every 14 days. May dispense FreeStyle [employer] or similar. ?  ?Continuous Glucose Monitor Sup Misc ?1 Units by Does not apply route 4 (four) times daily -  before meals and at bedtime. May substitute for cheapest option with patient insurance ?  ?FLUoxetine 20 MG capsule ?Commonly known as: PROZAC ?Take 20 mg by mouth daily. ?  ?hydrALAZINE 50 MG tablet ?Commonly known as: APRESOLINE ?Take 1 tablet (50 mg total) by mouth 3 (three) times daily. ?What changed: when to take this ?  ?insulin lispro 100 UNIT/ML  KwikPen ?Commonly known as: HumaLOG KwikPen ?Inject 3 Units into the skin 3 (three) times daily with meals. ?  ?Lantus SoloStar 100 UNIT/ML Solostar Pen ?Generic drug: insulin glargine ?Inject 20 Units into the skin every morning. ?  ?metoCLOPramide 5 MG tablet ?Commonly known as: REGLAN ?Take 1 tablet (5 mg total) by mouth 3 (three) times daily before meals. ?  ?multivitamin Tabs tablet ?Take 1 tablet by mouth at bedtime. ?  ?OneTouch Verio test strip ?Generic drug: glucose blood ?1 each by Other route 3 (three) times daily. And lancets 3/day ?What changed:  ?when to take this ?additional instructions ?  ?FREESTYLE TEST STRIPS test strip ?Generic drug: glucose blood ?Use as instructed ?What changed: Another medication with the same name was changed. Make sure you understand how and when to take each. ?  ?promethazine 25 MG suppository ?Commonly known as: PHENERGAN ?Place 1 suppository (25 mg total) rectally every 6 (six) hours as needed for nausea or vomiting. ?  ?sevelamer carbonate 800 MG tablet ?Commonly known as: RENVELA ?Take 1 tablet (800 mg total) by mouth 3 (three) times daily with meals. ?  ? ?  ? ? ?Disposition and follow-up:   ?[name] was discharged from [facility] in Stable condition.  At the hospital follow up visit please address: ? ?1.  N/V/ Gastroparesis- Assess for ongoing symptoms/ability to take PO ? ?2. DM- follows with endocrinology, requested CGM. Make sure she was able to  obtain this ? ?2.  Labs / imaging needed at time of follow-up: bmp ? ?3.  Pending labs/ test needing follow-up: none ? ?Follow-up Appointments: ? ? ?Hospital Course by problem list: ?1. Acute on chronic Intractable N/V; Diabetic gastroparesis associated with type 1 DM; Prolonged Qtc; Increased anion gap metabolic acidosis concerning for DKA; resolved ?Patient was recently discharged from the hospital for a similar presentation. She had been tolerating food and was eating and drinking without  N/V at the time of DC but the nausea and vomiting soon returned and she came back to the hospital. At the time of admission she had an anion gap of 23 in the setting of poor PO intake. Glucose was in the 200s concerning for possible early DKA versus starvation ketosis. Patient was put on the endotool until her anion gap closed, the N/V resolved, and she was able to again tolerate PO intake. Her N/V symptoms were treated with reglan and zofran. She was eating and drinking on 3/9 but was kept another night for observation given her recent bounce back to the hospital. By 3/10 she said that she was eating and drinking about 75% of her normal food and felt better than her prior discharge and was ready to go home.  ? ?2. T1DM- ?Patient was initially treated with endotool as above before transitioning back to home insulin doses. She will be sent out with a CGM. ? ?3. ESRD on PD ?Patient was compliant with home PD at home and did not miss any sessions. This was restarted for her while she was in the hospital. ? ?4. Hypokalemia; resolved ?Potassium was mildly low in the setting of possible DKA as above. Given her ESRD status this was gently repleted and was normal by the time of DC. ? ?5. HTN ?Slightly elevated on admission in the setting of acute N/V. She was continued on her home medications and will follow up in clinic for any adjustments.D She takes hydral 50 TID, amlodipine 10, and carvedilol 25. ? ?7. Depression ?Continued home prozac 20 daily ? ?Discharge Subjective ?Feels better this AM. Was able to tolerate regular diet with no nausea or vomiting. Feels like she is ready to go back home. Eating about 75% of her food, better than last time. Asking to leave before 2PM because son has an appointment today. ? ?Discharge Exam:   ?BP 135/90 (BP Location: Right Arm)   Pulse 80   Temp 98.2 ?F (36.8 ?C) (Oral)   Resp 16   Wt 68.5 kg   SpO2 100%   BMI 24.37 kg/m?  ?Discharge exam:  ?Gen: Young woman resting comfortably  in bed in NAD ?HEENT: normocephalic atraumatic, MMM ?Resp: normal WOB on room air ?Abd: soft, non-tender, normal bowel sounds ?Neuro: alert, oriented, answering questions appropriately ?Skin:warm and dry, normal skin turgor ?Psych:normal affect ? ?Pertinent Labs, Studies, and Procedures:  ? ?No results found.  ?CBC Latest Ref Rng & Units 03/29/2021 03/28/2021 03/25/2021  ?WBC 4.0 - 10.5 K/uL - 6.7 -  ?Hemoglobin 12.0 - 15.0 g/dL 13.6 13.1 12.9  ?Hematocrit 36.0 - 46.0 % 40.0 40.2 38.0  ?Platelets 150 - 400 K/uL - 446(H) -  ?  ?BMP Latest Ref Rng & Units 03/29/2021 03/28/2021 03/27/2021  ?Glucose 70 - 99 mg/dL - 198(H) 178(H)  ?BUN 6 - 20 mg/dL - 42(H) 40(H)  ?Creatinine 0.44 - 1.00 mg/dL - 9.69(H) 9.41(H)  ?BUN/Creat Ratio 9 - 23 - - -  ?Sodium 135 - 145 mmol/L 132(L) 133(L) 134(L)  ?Potassium  3.5 - 5.1 mmol/L 4.3 3.6 3.6  ?Chloride 98 - 111 mmol/L - 95(L) 101  ?CO2 22 - 32 mmol/L - 17(L) 20(L)  ?Calcium 8.9 - 10.3 mg/dL - 10.3 9.0  ?  ?Discharge Instructions: ?Discharge Instructions   ? ? Call MD for:  persistant dizziness or light-headedness   Complete by: As directed ?  ? Call MD for:  persistant nausea and vomiting   Complete by: As directed ?  ? Call MD for:  temperature >100.4   Complete by: As directed ?  ? Diet - low sodium heart healthy   Complete by: As directed ?  ? Increase activity slowly   Complete by: As directed ?  ? No wound care   Complete by: As directed ?  ? ?  ? ? ?Signed: ?Scarlett Presto, MD ?03/29/2021, 6:55 AM   ?Pager: [phone]  ? ?

## 2021-03-28 ENCOUNTER — Observation Stay (HOSPITAL_COMMUNITY)
Admission: EM | Admit: 2021-03-28 | Discharge: 2021-03-30 | Disposition: A | Discharge status: Home / Self Care | Payer: 59 | Attending: Internal Medicine | Admitting: Internal Medicine

## 2021-03-28 ENCOUNTER — Encounter (HOSPITAL_COMMUNITY): Payer: Self-pay

## 2021-03-28 ENCOUNTER — Other Ambulatory Visit: Payer: Self-pay

## 2021-03-28 DIAGNOSIS — E1022 Type 1 diabetes mellitus with diabetic chronic kidney disease: Secondary | ICD-10-CM | POA: Diagnosis not present

## 2021-03-28 DIAGNOSIS — Z7984 Long term (current) use of oral hypoglycemic drugs: Secondary | ICD-10-CM | POA: Diagnosis not present

## 2021-03-28 DIAGNOSIS — E876 Hypokalemia: Secondary | ICD-10-CM | POA: Diagnosis not present

## 2021-03-28 DIAGNOSIS — Z794 Long term (current) use of insulin: Secondary | ICD-10-CM | POA: Insufficient documentation

## 2021-03-28 DIAGNOSIS — Z79899 Other long term (current) drug therapy: Secondary | ICD-10-CM | POA: Diagnosis not present

## 2021-03-28 DIAGNOSIS — E871 Hypo-osmolality and hyponatremia: Secondary | ICD-10-CM | POA: Insufficient documentation

## 2021-03-28 DIAGNOSIS — Z992 Dependence on renal dialysis: Secondary | ICD-10-CM | POA: Diagnosis not present

## 2021-03-28 DIAGNOSIS — Z20822 Contact with and (suspected) exposure to covid-19: Secondary | ICD-10-CM | POA: Diagnosis not present

## 2021-03-28 DIAGNOSIS — R112 Nausea with vomiting, unspecified: Secondary | ICD-10-CM | POA: Diagnosis not present

## 2021-03-28 DIAGNOSIS — N186 End stage renal disease: Secondary | ICD-10-CM | POA: Insufficient documentation

## 2021-03-28 DIAGNOSIS — I120 Hypertensive chronic kidney disease with stage 5 chronic kidney disease or end stage renal disease: Secondary | ICD-10-CM | POA: Diagnosis not present

## 2021-03-28 DIAGNOSIS — J45909 Unspecified asthma, uncomplicated: Secondary | ICD-10-CM | POA: Insufficient documentation

## 2021-03-28 LAB — CBC WITH DIFFERENTIAL/PLATELET
Abs Immature Granulocytes: 0.05 10*3/uL (ref 0.00–0.07)
Basophils Absolute: 0 10*3/uL (ref 0.0–0.1)
Basophils Relative: 1 %
Eosinophils Absolute: 0 10*3/uL (ref 0.0–0.5)
Eosinophils Relative: 0 %
HCT: 40.2 % (ref 36.0–46.0)
Hemoglobin: 13.1 g/dL (ref 12.0–15.0)
Immature Granulocytes: 1 %
Lymphocytes Relative: 14 %
Lymphs Abs: 1 10*3/uL (ref 0.7–4.0)
MCH: 27.8 pg (ref 26.0–34.0)
MCHC: 32.6 g/dL (ref 30.0–36.0)
MCV: 85.2 fL (ref 80.0–100.0)
Monocytes Absolute: 0.3 10*3/uL (ref 0.1–1.0)
Monocytes Relative: 5 %
Neutro Abs: 5.3 10*3/uL (ref 1.7–7.7)
Neutrophils Relative %: 79 %
Platelets: 446 10*3/uL — ABNORMAL HIGH (ref 150–400)
RBC: 4.72 MIL/uL (ref 3.87–5.11)
RDW: 14.7 % (ref 11.5–15.5)
WBC: 6.7 10*3/uL (ref 4.0–10.5)
nRBC: 0 % (ref 0.0–0.2)

## 2021-03-28 LAB — URINALYSIS, ROUTINE W REFLEX MICROSCOPIC
Bilirubin Urine: NEGATIVE
Glucose, UA: >=500 mg/dL — AB
Ketones, ur: 20 mg/dL — AB
Nitrite: NEGATIVE
Protein, ur: >=300 mg/dL — AB
Specific Gravity, Urine: 1.01 (ref 1.005–1.030)
pH: 7 (ref 5.0–8.0)

## 2021-03-28 LAB — LIPASE, BLOOD: Lipase: 44 U/L (ref 11–51)

## 2021-03-28 NOTE — ED Triage Notes (Addendum)
Arrives [facility] from home with vomiting. Recently admitted and discharged yesterday for vomiting. Peritoneal dialysis. 4mg  zofran IVP administered pta.  ? ? ?EMS VS ?200/112 ?106 ?100% ?[id number]  ?

## 2021-03-28 NOTE — ED Provider Triage Note (Signed)
Emergency Medicine Provider Triage Evaluation Note ? ?Barbara Donovan , a 31 y.o. female  was evaluated in triage.  Pt complains of nausea and vomiting.  Reports that nausea vomiting started earlier today.  Patient has vomited multiple times today however is unable to specify have any times.  Describes emesis as stomach contents and "brown."  Complains of diffuse abdominal pain.  Patient reports taking her Reglan and Compazine at home with no improvement in her symptoms.  Patient received 4 mg of Zofran with EMS with no improvement in her symptoms. ? ?Review of Systems  ?Positive: Nausea, vomiting, abdominal pain ?Negative: Fever, chills, blood in stool, melena, diarrhea, dysuria, hematuria, urinary urgency, vaginal pain, vaginal bleeding, vaginal discharge ? ?Physical Exam  ?BP (!) 179/111   Pulse 100   Temp 98.3 ?F (36.8 ?C) (Oral)   Resp 18   Ht 5\' 6"  (1.676 m)   Wt 68.5 kg   SpO2 100%   BMI 24.37 kg/m?    ?Gen:   Awake, no distress   ?Resp:  Normal effort  ?MSK:   Moves extremities without difficulty  ?Other:  Abdomen soft, nondistended, nontender with no guarding or rebound tenderness. ? ?Medical Decision Making  ?Medically screening exam initiated at 11:02 PM.  Appropriate orders placed.  Barbara Donovan was informed that the remainder of the evaluation will be completed by another provider, this initial triage assessment does not replace that evaluation, and the importance of remaining in the ED until their evaluation is complete. ? ? ?  ?Loni Beckwith, PA-C ?03/28/21 2322 ? ?

## 2021-03-29 LAB — I-STAT VENOUS BLOOD GAS, ED
Acid-Base Excess: 1 mmol/L (ref 0.0–2.0)
Bicarbonate: 25.8 mmol/L (ref 20.0–28.0)
Calcium, Ion: 1.18 mmol/L (ref 1.15–1.40)
HCT: 40 % (ref 36.0–46.0)
Hemoglobin: 13.6 g/dL (ref 12.0–15.0)
O2 Saturation: 100 %
Potassium: 4.3 mmol/L (ref 3.5–5.1)
Sodium: 132 mmol/L — ABNORMAL LOW (ref 135–145)
TCO2: 27 mmol/L (ref 22–32)
pCO2, Ven: 41.3 mmHg — ABNORMAL LOW (ref 44–60)
pH, Ven: 7.404 (ref 7.25–7.43)
pO2, Ven: 183 mmHg — ABNORMAL HIGH (ref 32–45)

## 2021-03-29 LAB — BASIC METABOLIC PANEL
Anion gap: 20 — ABNORMAL HIGH (ref 5–15)
Anion gap: 14 (ref 5–15)
BUN: 44 mg/dL — ABNORMAL HIGH (ref 6–20)
BUN: 43 mg/dL — ABNORMAL HIGH (ref 6–20)
CO2: 18 mmol/L — ABNORMAL LOW (ref 22–32)
CO2: 22 mmol/L (ref 22–32)
Calcium: 10.1 mg/dL (ref 8.9–10.3)
Calcium: 9.2 mg/dL (ref 8.9–10.3)
Chloride: 94 mmol/L — ABNORMAL LOW (ref 98–111)
Chloride: 93 mmol/L — ABNORMAL LOW (ref 98–111)
Creatinine, Ser: 9.78 mg/dL — ABNORMAL HIGH (ref 0.44–1.00)
Creatinine, Ser: 9.45 mg/dL — ABNORMAL HIGH (ref 0.44–1.00)
GFR, Estimated: 5 mL/min — ABNORMAL LOW (ref >60)
GFR, Estimated: 5 mL/min — ABNORMAL LOW (ref >60)
Glucose, Bld: 193 mg/dL — ABNORMAL HIGH (ref 70–99)
Glucose, Bld: 127 mg/dL — ABNORMAL HIGH (ref 70–99)
Potassium: 3.3 mmol/L — ABNORMAL LOW (ref 3.5–5.1)
Potassium: 3.2 mmol/L — ABNORMAL LOW (ref 3.5–5.1)
Sodium: 132 mmol/L — ABNORMAL LOW (ref 135–145)
Sodium: 129 mmol/L — ABNORMAL LOW (ref 135–145)

## 2021-03-29 LAB — MAGNESIUM: Magnesium: 1.9 mg/dL (ref 1.7–2.4)

## 2021-03-29 LAB — COMPREHENSIVE METABOLIC PANEL
ALT: 16 U/L (ref 0–44)
AST: 16 U/L (ref 15–41)
Albumin: 3.8 g/dL (ref 3.5–5.0)
Alkaline Phosphatase: 48 U/L (ref 38–126)
Anion gap: 21 — ABNORMAL HIGH (ref 5–15)
BUN: 42 mg/dL — ABNORMAL HIGH (ref 6–20)
CO2: 17 mmol/L — ABNORMAL LOW (ref 22–32)
Calcium: 10.3 mg/dL (ref 8.9–10.3)
Chloride: 95 mmol/L — ABNORMAL LOW (ref 98–111)
Creatinine, Ser: 9.69 mg/dL — ABNORMAL HIGH (ref 0.44–1.00)
GFR, Estimated: 5 mL/min — ABNORMAL LOW (ref >60)
Glucose, Bld: 198 mg/dL — ABNORMAL HIGH (ref 70–99)
Potassium: 3.6 mmol/L (ref 3.5–5.1)
Sodium: 133 mmol/L — ABNORMAL LOW (ref 135–145)
Total Bilirubin: 0.6 mg/dL (ref 0.3–1.2)
Total Protein: 7.6 g/dL (ref 6.5–8.1)

## 2021-03-29 LAB — RESP PANEL BY RT-PCR (FLU A&B, COVID) ARPGX2
Influenza A by PCR: NEGATIVE
Influenza B by PCR: NEGATIVE
SARS Coronavirus 2 by RT PCR: NEGATIVE

## 2021-03-29 LAB — CBG MONITORING, ED
Glucose-Capillary: 216 mg/dL — ABNORMAL HIGH (ref 70–99)
Glucose-Capillary: 139 mg/dL — ABNORMAL HIGH (ref 70–99)
Glucose-Capillary: 264 mg/dL — ABNORMAL HIGH (ref 70–99)
Glucose-Capillary: 163 mg/dL — ABNORMAL HIGH (ref 70–99)

## 2021-03-29 LAB — I-STAT BETA HCG BLOOD, ED (MC, WL, AP ONLY): I-stat hCG, quantitative: <5 m[IU]/mL (ref <5)

## 2021-03-29 LAB — GLUCOSE, CAPILLARY: Glucose-Capillary: 109 mg/dL — ABNORMAL HIGH (ref 70–99)

## 2021-03-29 LAB — BETA-HYDROXYBUTYRIC ACID
Beta-Hydroxybutyric Acid: 2.85 mmol/L — ABNORMAL HIGH (ref 0.05–0.27)
Beta-Hydroxybutyric Acid: 0.07 mmol/L (ref 0.05–0.27)

## 2021-03-29 MED ORDER — DELFLEX-LC/2.5% DEXTROSE 394 MOSM/L IP SOLN: INTRAPERITONEAL | Status: DC

## 2021-03-29 MED ORDER — DELFLEX-LC/2.5% DEXTROSE 394 MOSM/L IP SOLN
INTRAPERITONEAL | Status: DC
Start: 2021-03-29 — End: 2021-03-30

## 2021-03-29 MED ORDER — ONDANSETRON HCL 4 MG/2ML IJ SOLN: 4.0000 mg | Freq: Four times a day (QID) | INTRAMUSCULAR | Status: DC | PRN

## 2021-03-29 MED ORDER — HYDRALAZINE HCL 20 MG/ML IJ SOLN
5.0000 mg | INTRAMUSCULAR | Status: DC | PRN
  Administered 2021-03-29: 5 mg via INTRAVENOUS
  Filled 2021-03-29: qty 1

## 2021-03-29 MED ORDER — ONDANSETRON HCL 4 MG PO TABS: 4.0000 mg | ORAL_TABLET | Freq: Four times a day (QID) | ORAL | Status: DC | PRN

## 2021-03-29 MED ORDER — INSULIN GLARGINE-YFGN 100 UNIT/ML ~~LOC~~ SOLN
8.0000 [IU] | Freq: Once | SUBCUTANEOUS | Status: AC
  Administered 2021-03-29: 8 [IU] via SUBCUTANEOUS
  Filled 2021-03-29: qty 0.08

## 2021-03-29 MED ORDER — FLUOXETINE HCL 20 MG PO CAPS
20.0000 mg | ORAL_CAPSULE | Freq: Every day | ORAL | Status: DC
  Administered 2021-03-29 – 2021-03-30 (×2): 20 mg via ORAL
  Filled 2021-03-29 (×2): qty 1

## 2021-03-29 MED ORDER — DELFLEX-LC/1.5% DEXTROSE 344 MOSM/L IP SOLN: INTRAPERITONEAL | Status: DC

## 2021-03-29 MED ORDER — INSULIN GLARGINE-YFGN 100 UNIT/ML ~~LOC~~ SOLN
8.0000 [IU] | Freq: Every day | SUBCUTANEOUS | Status: DC
  Administered 2021-03-29: 8 [IU] via SUBCUTANEOUS
  Filled 2021-03-29 (×2): qty 0.08

## 2021-03-29 MED ORDER — POTASSIUM CHLORIDE 10 MEQ/100ML IV SOLN
10.0000 meq | INTRAVENOUS | Status: AC
  Administered 2021-03-29 (×2): 10 meq via INTRAVENOUS
  Filled 2021-03-29 (×2): qty 100

## 2021-03-29 MED ORDER — LORAZEPAM 2 MG/ML IJ SOLN: 0.5000 mg | Freq: Four times a day (QID) | INTRAMUSCULAR | Status: DC | PRN

## 2021-03-29 MED ORDER — METOCLOPRAMIDE HCL 5 MG/ML IJ SOLN
10.0000 mg | Freq: Four times a day (QID) | INTRAMUSCULAR | Status: DC
  Administered 2021-03-29 – 2021-03-30 (×4): 10 mg via INTRAVENOUS
  Filled 2021-03-29 (×4): qty 2

## 2021-03-29 MED ORDER — AMLODIPINE BESYLATE 10 MG PO TABS
10.0000 mg | ORAL_TABLET | Freq: Every day | ORAL | Status: DC
  Administered 2021-03-29 – 2021-03-30 (×2): 10 mg via ORAL
  Filled 2021-03-29: qty 2
  Filled 2021-03-29: qty 1

## 2021-03-29 MED ORDER — HEPARIN 1000 UNIT/ML FOR PERITONEAL DIALYSIS
INTRAMUSCULAR | Status: DC | PRN
  Filled 2021-03-29 (×3): qty 5000

## 2021-03-29 MED ORDER — LABETALOL HCL 5 MG/ML IV SOLN: 10.0000 mg | INTRAVENOUS | Status: DC | PRN

## 2021-03-29 MED ORDER — HYDRALAZINE HCL 20 MG/ML IJ SOLN: 5.0000 mg | Freq: Four times a day (QID) | INTRAMUSCULAR | Status: DC | PRN

## 2021-03-29 MED ORDER — CARVEDILOL 25 MG PO TABS
25.0000 mg | ORAL_TABLET | Freq: Two times a day (BID) | ORAL | Status: DC
  Administered 2021-03-29 – 2021-03-30 (×3): 25 mg via ORAL
  Filled 2021-03-29: qty 1
  Filled 2021-03-29: qty 2
  Filled 2021-03-29: qty 1

## 2021-03-29 MED ORDER — HEPARIN SODIUM (PORCINE) 5000 UNIT/ML IJ SOLN
5000.0000 [IU] | Freq: Three times a day (TID) | INTRAMUSCULAR | Status: DC
  Administered 2021-03-29 – 2021-03-30 (×4): 5000 [IU] via SUBCUTANEOUS
  Filled 2021-03-29 (×3): qty 1

## 2021-03-29 MED ORDER — ONDANSETRON HCL 4 MG/2ML IJ SOLN
4.0000 mg | Freq: Four times a day (QID) | INTRAMUSCULAR | Status: DC | PRN
Start: 2021-03-29 — End: 2021-03-29

## 2021-03-29 MED ORDER — POTASSIUM CHLORIDE CRYS ER 20 MEQ PO TBCR
40.0000 meq | EXTENDED_RELEASE_TABLET | Freq: Two times a day (BID) | ORAL | Status: AC
Start: 2021-03-29 — End: 2021-03-30
  Administered 2021-03-29 – 2021-03-30 (×2): 40 meq via ORAL
  Filled 2021-03-29 (×2): qty 2

## 2021-03-29 MED ORDER — LACTATED RINGERS IV BOLUS
1000.0000 mL | Freq: Once | INTRAVENOUS | Status: AC
  Administered 2021-03-29: 1000 mL via INTRAVENOUS

## 2021-03-29 MED ORDER — INSULIN ASPART 100 UNIT/ML IJ SOLN
0.0000 [IU] | Freq: Three times a day (TID) | INTRAMUSCULAR | Status: DC
  Administered 2021-03-29 – 2021-03-30 (×2): 1 [IU] via SUBCUTANEOUS

## 2021-03-29 MED ORDER — HYDRALAZINE HCL 50 MG PO TABS
50.0000 mg | ORAL_TABLET | Freq: Three times a day (TID) | ORAL | Status: DC
  Administered 2021-03-29 – 2021-03-30 (×4): 50 mg via ORAL
  Filled 2021-03-29: qty 1
  Filled 2021-03-29: qty 2
  Filled 2021-03-29 (×2): qty 1

## 2021-03-29 MED ORDER — GENTAMICIN SULFATE 0.1 % EX CREA
1.0000 "application " | TOPICAL_CREAM | Freq: Every day | CUTANEOUS | Status: DC
  Administered 2021-03-29: 1 via TOPICAL
  Filled 2021-03-29: qty 15

## 2021-03-29 MED ORDER — METOCLOPRAMIDE HCL 5 MG/ML IJ SOLN
10.0000 mg | Freq: Once | INTRAMUSCULAR | Status: AC
  Administered 2021-03-29: 10 mg via INTRAVENOUS
  Filled 2021-03-29: qty 2

## 2021-03-29 NOTE — ED Provider Notes (Signed)
?[facility] ?Provider Note ? ? ?CSN: [id number] ?Arrival date & time: 03/28/21  2244 ? ?  ? ?History ? ?Chief Complaint  ?Patient presents with  ? Vomiting  ? ? ?Barbara Donovan is a 31 y.o. female. ? ?The history is provided by the patient and medical records.  ?Barbara Donovan is a 31 y.o. female who presents to the Emergency Department complaining of nausea and vomiting.  She presents to the ED for evaluation of N/V.  She has been experiencing issues with N/V for the last week, discharged from the hospital yesterday.  Too many episodes of emesis to count.  No fever, diarrhea.  She is on PD.  Did not dialyze today.  No issues with dialysis.  Makes urine.  No change in urine quality.   ? ?Has ESRD, T1DM, HTN.   ? ?Did not get prescriptions as time of hospital discharge.  Last kept down po yesterday.   ?  ? ?Home Medications ?Prior to Admission medications   ?Medication Sig Start Date End Date Taking? Authorizing Provider  ?amLODipine (NORVASC) 10 MG tablet Take 1 tablet (10 mg total) by mouth daily. 03/09/20   Mercy Riding, MD  ?brimonidine (ALPHAGAN) 0.2 % ophthalmic solution Place 1 drop into the left eye 3 (three) times daily. 03/09/21   [provider]  ?calcitRIOL (ROCALTROL) 0.5 MCG capsule Take 0.5 mcg by mouth daily. 12/02/20   [provider]  ?carvedilol (COREG) 25 MG tablet Take 1 tablet (25 mg total) by mouth 2 (two) times daily. 03/07/19   Imogene Burn, PA-C  ?Continuous Blood Gluc Receiver DEVI 1 Units by Does not apply route 4 (four) times daily -  before meals and at bedtime. May substitute for cheapest monitor with insurance 03/27/21   Scarlett Presto, MD  ?Continuous Blood Gluc Sensor MISC 1 each by Does not apply route as directed. Use as directed every 14 days. May dispense FreeStyle [employer] or similar. ?Patient not taking: Reported on 03/21/2021 01/27/20   Antonieta Pert, MD  ?Continuous Glucose Monitor Sup MISC 1 Units by Does not  apply route 4 (four) times daily -  before meals and at bedtime. May substitute for cheapest option with patient insurance 03/27/21 03/27/22  Scarlett Presto, MD  ?FLUoxetine (PROZAC) 20 MG capsule Take 20 mg by mouth daily. 03/16/21   [provider]  ?glucose blood (FREESTYLE TEST STRIPS) test strip Use as instructed ?Patient not taking: Reported on 03/21/2021 02/25/21   Terrilee Croak, MD  ?glucose blood (ONETOUCH VERIO) test strip 1 each by Other route 3 (three) times daily. And lancets 3/day ?Patient taking differently: 1 each by Other route 4 (four) times daily -  before meals and at bedtime. 11/28/20   Renato Shin, MD  ?hydrALAZINE (APRESOLINE) 50 MG tablet Take 1 tablet (50 mg total) by mouth 3 (three) times daily. ?Patient taking differently: Take 50 mg by mouth in the morning and at bedtime. 12/31/20   Domenic Polite, MD  ?insulin glargine (LANTUS SOLOSTAR) 100 UNIT/ML Solostar Pen Inject 20 Units into the skin every morning. 02/05/21   Bonnielee Haff, MD  ?insulin lispro (HUMALOG KWIKPEN) 100 UNIT/ML KwikPen Inject 3 Units into the skin 3 (three) times daily with meals. 11/28/20   Renato Shin, MD  ?metoCLOPramide (REGLAN) 5 MG tablet Take 1 tablet (5 mg total) by mouth 3 (three) times daily before meals. 02/23/21   Sherwood Gambler, MD  ?multivitamin (RENA-VIT) TABS tablet Take 1 tablet by mouth  at bedtime. 03/08/20   Mercy Riding, MD  ?promethazine (PHENERGAN) 25 MG suppository Place 1 suppository (25 mg total) rectally every 6 (six) hours as needed for nausea or vomiting. ?Patient not taking: Reported on 03/25/2021 02/19/21   Margarita Mail, PA-C  ?sevelamer carbonate (RENVELA) 800 MG tablet Take 1 tablet (800 mg total) by mouth 3 (three) times daily with meals. 02/05/21   Bonnielee Haff, MD  ?   ? ?Allergies    ?Patient has no known allergies.   ? ?Review of Systems   ?Review of Systems  ?All other systems reviewed and are negative. ? ?Physical Exam ?Updated Vital Signs ?BP (!) 153/85   Pulse 91   Temp  98.4 ?F (36.9 ?C) (Oral)   Resp 14   Ht 5\' 6"  (1.676 m)   Wt 68.5 kg   SpO2 97%   BMI 24.37 kg/m?  ?Physical Exam ?Vitals and nursing note reviewed.  ?Constitutional:   ?   Appearance: She is well-developed.  ?HENT:  ?   Head: Normocephalic and atraumatic.  ?Cardiovascular:  ?   Rate and Rhythm: Regular rhythm. Tachycardia present.  ?Pulmonary:  ?   Effort: Pulmonary effort is normal. No respiratory distress.  ?Abdominal:  ?   Palpations: Abdomen is soft.  ?   Tenderness: There is no abdominal tenderness. There is no guarding or rebound.  ?   Comments: PD catheter in LUQ without local erythema or drainage  ?Musculoskeletal:     ?   General: No tenderness.  ?Skin: ?   General: Skin is warm and dry.  ?Neurological:  ?   Mental Status: She is alert and oriented to person, place, and time.  ?Psychiatric:     ?   Behavior: Behavior normal.  ? ? ?ED Results / Procedures / Treatments   ?Labs ?(all labs ordered are listed, but only abnormal results are displayed) ?Labs Reviewed  ?COMPREHENSIVE METABOLIC PANEL - Abnormal; Notable for the following components:  ?    Result Value  ? Sodium 133 (*)   ? Chloride 95 (*)   ? CO2 17 (*)   ? Glucose, Bld 198 (*)   ? BUN 42 (*)   ? Creatinine, Ser 9.69 (*)   ? GFR, Estimated 5 (*)   ? Anion gap 21 (*)   ? All other components within normal limits  ?CBC WITH DIFFERENTIAL/PLATELET - Abnormal; Notable for the following components:  ? Platelets 446 (*)   ? All other components within normal limits  ?URINALYSIS, ROUTINE W REFLEX MICROSCOPIC - Abnormal; Notable for the following components:  ? APPearance HAZY (*)   ? Glucose, UA >=500 (*)   ? Hgb urine dipstick SMALL (*)   ? Ketones, ur 20 (*)   ? Protein, ur >=300 (*)   ? Leukocytes,Ua TRACE (*)   ? Bacteria, UA FEW (*)   ? All other components within normal limits  ?CBG MONITORING, ED - Abnormal; Notable for the following components:  ? Glucose-Capillary 216 (*)   ? All other components within normal limits  ?LIPASE, BLOOD  ?I-STAT  BETA HCG BLOOD, ED ([facility], [facility], [facility] ONLY)  ? ? ?EKG ?None ? ?Radiology ?No results found. ? ?Procedures ?Procedures  ? ? ?Medications Ordered in ED ?Medications - No data to display ? ?ED Course/ Medical Decision Making/ A&P ?  ?                        ?Medical Decision Making ?Amount and/or  Complexity of Data Reviewed ?Labs: ordered. ? ?Risk ?Prescription drug management. ?Decision regarding hospitalization. ? ? ?Patient with history of type 1 diabetes, ESRD on peritoneal dialysis here for evaluation of recurrent nausea and vomiting, unable to tolerate orals.  Patient actively vomiting on initial assessment.  She has no significant abdominal tenderness.  Her potassium is within normal limits.  Her bicarb is low and she has an elevated anion gap but no acidosis on her VBG.  She was treated with Reglan for her emesis.  Given recurrent and profound symptoms medicine consulted for admission for ongoing management. ? ? ? ? ? ? ? ?Final Clinical Impression(s) / ED Diagnoses ?Final diagnoses:  ?None  ? ? ?Rx / DC Orders ?ED Discharge Orders   ? ? None  ? ?  ? ? ?  ?Quintella Reichert, MD ?03/29/21 [phone] ? ?

## 2021-03-29 NOTE — ED Notes (Signed)
Providers at bedside.

## 2021-03-29 NOTE — Progress Notes (Signed)
[facility] ?[facility] ? ?HPI: The patient is a 31 y.o. year-old came to ED w/ nausea and vomiting x 24 hrs. Just dc'd after brief admit for DKA last week.  Pt w/ DM1  and ESRD on PD. Asked to see for ESRD.  ? ?Pt seen in her room, main c/o was nausea. No abd pain. Doesn't feel vol overloaded.  ? ? ?Vitals:  ? 03/29/21 0815 03/29/21 0830 03/29/21 0848 03/29/21 0900  ?BP: (!) 188/118 (!) 201/105 (!) 201/105 (!) 174/96  ?Pulse: 99 82  90  ?Resp: 19   16  ?Temp:      ?TempSrc:      ?SpO2: 100% 96%  99%  ?Weight:      ?Height:      ? ?Exam ?Gen alert, no distress ?No jvd or bruits ?Chest clear bilat to bases ?RRR no RG ?Abd soft ntnd no mass or ascites +bs ?MS no joint effusions or deformity ?Ext no LE or UE edema ?Neuro is alert, Ox 3 , nf ? PD cath abdomen intact ? ? ? ? ? Home meds include - norvasc, coreg 25 bid, hydralazine 50 tid, insulin lantus/ humalog, reglan tid, renavit, renvela 800 tid ? ? ? ? OP PD: 5 exchange x 2 L each overnight, 1.5h each, edw 63kg, no daybag ? - IV fe and mircera ordered, not sure if given ? - usually uses half 1.5% and half 2.5% fluids ?  ? ? ?Assessment/ Plan: ?Nausea/ vomiting - per pmd ?DM1 - per pmd ?ESRD - cont CCPD here. PD tonight.  ?Volume - exam okay but several kg up by wts, cont 1.5% and 2.5% mix as at home ?HTN - getting home meds x 3 for BP control here ?Anemia ckd - Hb 13  ?MBD ckd - Ca in range. Resume binders when eating.  ?  ? ? ? ?Kelly Splinter  MD ?03/29/2021, 9:26 AM ?Recent Labs  ?Lab 03/25/21 ?1817 03/25/21 ?2134 03/28/21 ?2315 03/29/21 ?1610 03/29/21 ?9604  ?HGB  --   --  13.1 13.6  --   ?ALBUMIN 3.3*  --  3.8  --   --   ?CALCIUM 9.4   < > 10.3  --  10.1  ?PHOS 7.3*  --   --   --   --   ?CREATININE 9.52*   < > 9.69*  --  9.78*  ?K 3.3*   < > 3.6 4.3 3.3*  ? < > = values in this interval not displayed.  ? ? ? ?

## 2021-03-29 NOTE — ED Notes (Signed)
ED TO INPATIENT HANDOFF REPORT  ED Nurse Name and Phone #: Baxter Flattery, RN  S Name/Age/Gender Barbara Donovan 31 y.o. female Room/Bed: 026C/026C  Code Status   Code Status: Full Code  Home/SNF/Other Home Patient oriented to: self, place, time, and situation Is this baseline? Yes   Triage Complete: Triage complete  Chief Complaint Intractable nausea and vomiting [R11.2]  Triage Note Arrives [facility] from home with vomiting. Recently admitted and discharged yesterday for vomiting. Peritoneal dialysis. 4mg  zofran IVP administered pta.    EMS VS 200/112 106 100% [id number]    Allergies No Known Allergies  Level of Care/Admitting Diagnosis ED Disposition     ED Disposition  Admit   Condition  --   Comment  Hospital Area: [facility] [[id number]]  Level of Care: Med-Surg [16]  May place patient in observation at [facility] or [facility] if equivalent level of care is available:: No  Covid Evaluation: Asymptomatic Screening Protocol (No Symptoms)  Diagnosis: Intractable nausea and vomiting [606301]  Admitting Physician: Angelica Pou [place]  Attending Physician: Angelica Pou [[id number]]          B Medical/Surgery History Past Medical History:  Diagnosis Date   Asthma    as a child, no problems as an adult, no inhaler   Cataract    NS OU   Chronic hypertension during pregnancy, antepartum 08/19/2017   Dehydration 01/28/2018   Depression during pregnancy, antepartum 07/07/2017   6/20: Short trial of zoloft previously, reports didn't help much but also didn't give it a chance Discussed r/b/a SSRIs in pregnancy, agrees to try Zoloft again, rx sent No SI/HI/red flags   Diabetes ([place])    TYPE I. 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 A2/B GDM)  Required Referrals for A1GDM or A2GDM: [x]  Diabetes Education and Testing Supplies [x]  Nutrition Cousult  For A2/B GDM or higher classes of DM [x]  Diabetes Education and Testing Supplies [x]  Nutrition Counsult [x]  Fetal ECHO after 20 weeks  [x]  Eye exam for retina evaluation - severe retinopathy 7/19  Base   Ventricular septal defect (VSD) of fetus in singleton pregnancy, antepartum 09/30/2017   May go to newborn nursery per Dr. Lenard Simmer Echo prior to discharge   Past Surgical History:  Procedure Laterality Date   25 GAUGE PARS PLANA VITRECTOMY WITH 20 GAUGE MVR PORT FOR MACULAR HOLE Left 07/20/2018   Procedure: 25 [place] VITRECTOMY LEFT EYE;  Surgeon: Bernarda Caffey, MD;  Location: [facility];  Service: Ophthalmology;  Laterality: Left;   CAPD INSERTION N/A 09/10/2020   Procedure: LAPAROSCOPIC INSERTION CONTINUOUS AMBULATORY PERITONEAL DIALYSIS  (CAPD) CATHETER;  Surgeon: Serafina Mitchell, MD;  Location: [facility];  Service: Vascular;  Laterality: N/A;   CESAREAN SECTION N/A 12/26/2017   Procedure: CESAREAN SECTION;  Surgeon: Osborne Oman, MD;  Location: [facility];  Service: Obstetrics;  Laterality: N/A;  EYE EXAMINATION UNDER ANESTHESIA Right 07/20/2018   Procedure: Eye Exam Under Anesthesia RIGHT EYE;  Surgeon: Bernarda Caffey, MD;  Location: [facility];  Service: Ophthalmology;  Laterality: Right;   EYE SURGERY Left 07/2018   GAS INSERTION Left 07/19/2019   Procedure: INSERTION OF GAS;  Surgeon: Bernarda Caffey, MD;  Location: [facility];  Service: Ophthalmology;  Laterality: Left;  SF6    INJECTION OF SILICONE OIL Left 08/21/6960   Procedure: Injection Of Silicone Oil LEFT EYE;  Surgeon: Bernarda Caffey, MD;  Location: [facility];  Service: Ophthalmology;  Laterality: Left;   IR FLUORO GUIDE CV LINE RIGHT  06/02/2020   IR US GUIDE VASC ACCESS RIGHT  06/02/2020   LASER PHOTO ABLATION Right 07/20/2018   Procedure: Laser Photo Ablation RIGHT EYE;  Surgeon: Bernarda Caffey, MD;  Location: [facility];  Service: Ophthalmology;  Laterality: Right;   MEMBRANE PEEL Left 07/20/2018   Procedure: Membrane Peel LEFT EYE;  Surgeon: Bernarda Caffey, MD;  Location: [place];  Service: Ophthalmology;  Laterality: Left;   MEMBRANE PEEL Left 07/19/2019   Procedure: MEMBRANE PEEL;  Surgeon: Bernarda Caffey, MD;  Location: [place];  Service: Ophthalmology;  Laterality: Left;   MITOMYCIN C APPLICATION Bilateral 09/23/2839   Procedure: Avastin Application;  Surgeon: Bernarda Caffey, MD;  Location: [facility];  Service: Ophthalmology;  Laterality: Bilateral;   PHOTOCOAGULATION WITH LASER Left 07/20/2018   Procedure: Photocoagulation With Laser LEFT EYE;  Surgeon: Bernarda Caffey, MD;  Location: [place];  Service: Ophthalmology;  Laterality: Left;   PHOTOCOAGULATION WITH LASER Left 07/19/2019   Procedure: PHOTOCOAGULATION WITH LASER;  Surgeon: Bernarda Caffey, MD;  Location: [facility];  Service: Ophthalmology;  Laterality: Left;   RETINAL DETACHMENT SURGERY Left 07/20/2018   Dr. Bernarda Caffey   SILICON OIL REMOVAL Left 07/19/2019   Procedure: 25g PARS PLANA VITRECTOMY WITH SILICON OIL REMOVAL;  Surgeon: Bernarda Caffey, MD;  Location: [facility];  Service: Ophthalmology;  Laterality: Left;   WISDOM TOOTH EXTRACTION       A IV Location/Drains/Wounds Patient Lines/Drains/Airways Status     Active Line/Drains/Airways     Name Placement date Placement time Site Days   Peripheral IV 03/28/21 22 G 1" Anterior;Left Wrist 03/28/21  2248  Wrist  1   Incision (Closed) 09/10/20 Abdomen Other (Comment) 09/10/20  1028  -- 200   Incision - 4 Ports Abdomen 1: Umbilicus  2: Mid;Upper 3: Right;Lateral 4: Right;Lower 09/10/20  --  -- 200            Intake/Output Last 24 hours  Intake/Output Summary (Last 24 hours) at 03/29/2021 1727 Last data filed at 03/29/2021 1313 Gross per 24 hour  Intake 699.47 ml  Output --  Net 699.47 ml    Labs/Imaging Results for orders placed or performed during the hospital encounter of 03/28/21 (from the past 48 hour(s))  Comprehensive metabolic panel     Status: Abnormal   Collection Time: 03/28/21 11:15 PM  Result Value Ref Range   Sodium 133 (L) 135 - 145 mmol/L   Potassium 3.6 3.5 - 5.1 mmol/L   Chloride 95 (L) 98 - 111 mmol/L   CO2 17 (L) 22 - 32 mmol/L   Glucose, Bld 198 (H) 70 - 99 mg/dL    Comment: Glucose reference range applies only to samples taken after fasting for at least 8 hours.   BUN 42 (H) 6 - 20 mg/dL   Creatinine, Ser 9.69 (H) 0.44 - 1.00 mg/dL   Calcium 10.3 8.9 - 10.3 mg/dL  Total Protein 7.6 6.5 - 8.1 g/dL   Albumin 3.8 3.5 - 5.0 g/dL   AST 16 15 - 41 U/L   ALT 16 0 - 44 U/L   Alkaline Phosphatase 48 38 - 126 U/L   Total Bilirubin 0.6 0.3 - 1.2 mg/dL   GFR, Estimated 5 (L) >60 mL/min    Comment: (NOTE) Calculated using the CKD-EPI Creatinine Equation (2021)    Anion gap 21 (H) 5 - 15    Comment: Electrolytes repeated to confirm. Performed at [facility], [place] [address], [place], [place] [address]   CBC with Differential     Status: Abnormal   Collection Time: 03/28/21 11:15 PM  Result Value Ref Range   WBC 6.7 4.0 - 10.5 K/uL   RBC 4.72 3.87 - 5.11 MIL/uL   Hemoglobin 13.1 12.0 - 15.0 g/dL   HCT 40.2 36.0 - 46.0 %   MCV 85.2 80.0 - 100.0 fL   MCH 27.8 26.0 - 34.0 pg   MCHC 32.6 30.0 - 36.0 g/dL   RDW 14.7 11.5 - 15.5 %   Platelets 446 (H) 150 - 400 K/uL   nRBC 0.0 0.0 - 0.2 %   Neutrophils Relative % 79 %   Neutro Abs 5.3 1.7 - 7.7 K/uL   Lymphocytes Relative 14 %   Lymphs Abs 1.0 0.7 - 4.0 K/uL   Monocytes Relative 5 %   Monocytes Absolute 0.3 0.1 - 1.0 K/uL    Eosinophils Relative 0 %   Eosinophils Absolute 0.0 0.0 - 0.5 K/uL   Basophils Relative 1 %   Basophils Absolute 0.0 0.0 - 0.1 K/uL   Immature Granulocytes 1 %   Abs Immature Granulocytes 0.05 0.00 - 0.07 K/uL    Comment: Performed at [facility] [address] [place], [place] [address]  Lipase, blood     Status: None   Collection Time: 03/28/21 11:15 PM  Result Value Ref Range   Lipase 44 11 - 51 U/L    Comment: Performed at [facility] [address] [place], [place] [address]  Urinalysis, Routine w reflex microscopic Urine, Clean Catch     Status: Abnormal   Collection Time: 03/28/21 11:15 PM  Result Value Ref Range   Color, Urine YELLOW YELLOW   APPearance HAZY (A) CLEAR   Specific Gravity, Urine 1.010 1.005 - 1.030   pH 7.0 5.0 - 8.0   Glucose, UA >=500 (A) NEGATIVE mg/dL   Hgb urine dipstick SMALL (A) NEGATIVE   Bilirubin Urine NEGATIVE NEGATIVE   Ketones, ur 20 (A) NEGATIVE mg/dL   Protein, ur >=300 (A) NEGATIVE mg/dL   Nitrite NEGATIVE NEGATIVE   Leukocytes,Ua TRACE (A) NEGATIVE   RBC / HPF 0-5 0 - 5 RBC/hpf   WBC, UA 11-20 0 - 5 WBC/hpf   Bacteria, UA FEW (A) NONE SEEN   Squamous Epithelial / LPF 0-5 0 - 5    Comment: Performed at [facility], [place] [address] [place], [place] [address]  I-Stat Beta hCG blood, ED ([facility], [facility], [facility] only)     Status: None   Collection Time: 03/29/21 12:10 AM  Result Value Ref Range   I-stat hCG, quantitative <5.0 <5 mIU/mL   Comment 3            Comment:   GEST. AGE      CONC.  (mIU/mL)   <=1 WEEK        5 - 50  2 WEEKS       50 - 500     3 WEEKS       100 - 10,000     4 WEEKS     1,000 - 30,000        FEMALE AND NON-PREGNANT FEMALE:     LESS THAN 5 mIU/mL   POC CBG, ED     Status: Abnormal   Collection Time: 03/29/21  4:44 AM  Result Value Ref Range   Glucose-Capillary 216 (H) 70 - 99 mg/dL    Comment: Glucose reference range applies only to samples taken after fasting for at least 8 hours.  I-Stat venous  blood gas, ED     Status: Abnormal   Collection Time: 03/29/21  5:14 AM  Result Value Ref Range   pH, Ven 7.404 7.25 - 7.43   pCO2, Ven 41.3 (L) 44 - 60 mmHg   pO2, Ven 183 (H) 32 - 45 mmHg   Bicarbonate 25.8 20.0 - 28.0 mmol/L   TCO2 27 22 - 32 mmol/L   O2 Saturation 100 %   Acid-Base Excess 1.0 0.0 - 2.0 mmol/L   Sodium 132 (L) 135 - 145 mmol/L   Potassium 4.3 3.5 - 5.1 mmol/L   Calcium, Ion 1.18 1.15 - 1.40 mmol/L   HCT 40.0 36.0 - 46.0 %   Hemoglobin 13.6 12.0 - 15.0 g/dL   Sample type VENOUS   Resp Panel by RT-PCR (Flu A&B, Covid) Nasopharyngeal Swab     Status: None   Collection Time: 03/29/21  7:07 AM   Specimen: Nasopharyngeal Swab; Nasopharyngeal(NP) swabs in vial transport medium  Result Value Ref Range   SARS Coronavirus 2 by RT PCR NEGATIVE NEGATIVE    Comment: (NOTE) SARS-CoV-2 target nucleic acids are NOT DETECTED.  The SARS-CoV-2 RNA is generally detectable in upper respiratory specimens during the acute phase of infection. The lowest concentration of SARS-CoV-2 viral copies this assay can detect is 138 copies/mL. A negative result does not preclude SARS-Cov-2 infection and should not be used as the sole basis for treatment or other patient management decisions. A negative result may occur with  improper specimen collection/handling, submission of specimen other than nasopharyngeal swab, presence of viral mutation(s) within the areas targeted by this assay, and inadequate number of viral copies(<138 copies/mL). A negative result must be combined with clinical observations, patient history, and epidemiological information. The expected result is Negative.  Fact Sheet for Patients:  EntrepreneurPulse.com.au  Fact Sheet for Healthcare Providers:  IncredibleEmployment.be  This test is no t yet approved or cleared by the [place] FDA and  has been authorized for detection and/or diagnosis of SARS-CoV-2 by FDA under an  Emergency Use Authorization (EUA). This EUA will remain  in effect (meaning this test can be used) for the duration of the COVID-19 declaration under Section 564(b)(1) of the Act, 21 U.S.C.section 360bbb-3(b)(1), unless the authorization is terminated  or revoked sooner.       Influenza A by PCR NEGATIVE NEGATIVE   Influenza B by PCR NEGATIVE NEGATIVE    Comment: (NOTE) The Xpert Xpress SARS-CoV-2/FLU/RSV plus assay is intended as an aid in the diagnosis of influenza from Nasopharyngeal swab specimens and should not be used as a sole basis for treatment. Nasal washings and aspirates are unacceptable for Xpert Xpress SARS-CoV-2/FLU/RSV testing.  Fact Sheet for Patients: EntrepreneurPulse.com.au  Fact Sheet for Healthcare Providers: IncredibleEmployment.be  This test is not yet approved or cleared by the [place] and has been authorized  for detection and/or diagnosis of SARS-CoV-2 by FDA under an Emergency Use Authorization (EUA). This EUA will remain in effect (meaning this test can be used) for the duration of the COVID-19 declaration under Section 564(b)(1) of the Act, 21 U.S.C. section 360bbb-3(b)(1), unless the authorization is terminated or revoked.  Performed at [facility], [place] [address], [place], [place] [address]   Beta-hydroxybutyric acid     Status: Abnormal   Collection Time: 03/29/21  7:27 AM  Result Value Ref Range   Beta-Hydroxybutyric Acid 2.85 (H) 0.05 - 0.27 mmol/L    Comment: Performed at [facility] [address], [place], [place] [address]  Basic metabolic panel     Status: Abnormal   Collection Time: 03/29/21  7:28 AM  Result Value Ref Range   Sodium 132 (L) 135 - 145 mmol/L   Potassium 3.3 (L) 3.5 - 5.1 mmol/L    Comment: DELTA CHECK NOTED   Chloride 94 (L) 98 - 111 mmol/L   CO2 18 (L) 22 - 32 mmol/L   Glucose, Bld 193 (H) 70 - 99 mg/dL    Comment: Glucose reference range applies only to  samples taken after fasting for at least 8 hours.   BUN 44 (H) 6 - 20 mg/dL   Creatinine, Ser 9.78 (H) 0.44 - 1.00 mg/dL   Calcium 10.1 8.9 - 10.3 mg/dL   GFR, Estimated 5 (L) >60 mL/min    Comment: (NOTE) Calculated using the CKD-EPI Creatinine Equation (2021)    Anion gap 20 (H) 5 - 15    Comment: Performed at [facility] [address], [place], [place] [address]  Magnesium     Status: None   Collection Time: 03/29/21  7:28 AM  Result Value Ref Range   Magnesium 1.9 1.7 - 2.4 mg/dL    Comment: Performed at [facility] [address] [place], [place] [address]  CBG monitoring, ED     Status: Abnormal   Collection Time: 03/29/21 11:48 AM  Result Value Ref Range   Glucose-Capillary 139 (H) 70 - 99 mg/dL    Comment: Glucose reference range applies only to samples taken after fasting for at least 8 hours.  CBG monitoring, ED     Status: Abnormal   Collection Time: 03/29/21  3:13 PM  Result Value Ref Range   Glucose-Capillary 264 (H) 70 - 99 mg/dL    Comment: Glucose reference range applies only to samples taken after fasting for at least 8 hours.  CBG monitoring, ED     Status: Abnormal   Collection Time: 03/29/21  5:04 PM  Result Value Ref Range   Glucose-Capillary 163 (H) 70 - 99 mg/dL    Comment: Glucose reference range applies only to samples taken after fasting for at least 8 hours.   No results found.  Pending Labs Unresulted Labs (From admission, onward)     Start     Ordered   03/30/21 0500  CBC  Tomorrow morning,   R        03/29/21 0728   03/29/21 8099  Basic metabolic panel  Now then every 8 hours,   R (with TIMED occurrences)      03/29/21 1427   03/29/21 2000  Beta-hydroxybutyric acid  Now then every 8 hours,   R (with TIMED occurrences)      03/29/21 1428            Vitals/Pain Today's Vitals   03/29/21 1330 03/29/21 1530 03/29/21  1600 03/29/21 1652  BP: (!) 151/89 129/72 129/77 (!) 161/95  Pulse: 97   95  Resp: 18 15 (!) 21 18   Temp:      TempSrc:      SpO2: 99%   100%  Weight:      Height:      PainSc:        Isolation Precautions No active isolations  Medications Medications  heparin injection 5,000 Units (5,000 Units Subcutaneous Given 03/29/21 1648)  metoCLOPramide (REGLAN) injection 10 mg (10 mg Intravenous Given 03/29/21 1108)  ondansetron (ZOFRAN) tablet 4 mg (has no administration in time range)    Or  ondansetron (ZOFRAN) injection 4 mg (has no administration in time range)  insulin glargine-yfgn (SEMGLEE) injection 8 Units (8 Units Subcutaneous Given 03/29/21 1037)  insulin aspart (novoLOG) injection 0-6 Units (0 Units Subcutaneous Not Given 03/29/21 1102)  LORazepam (ATIVAN) injection 0.5 mg (has no administration in time range)  FLUoxetine (PROZAC) capsule 20 mg (20 mg Oral Given 03/29/21 1122)  hydrALAZINE (APRESOLINE) tablet 50 mg (50 mg Oral Given 03/29/21 1122)  carvedilol (COREG) tablet 25 mg (25 mg Oral Given 03/29/21 1122)  amLODipine (NORVASC) tablet 10 mg (10 mg Oral Given 03/29/21 1122)  hydrALAZINE (APRESOLINE) injection 5 mg (has no administration in time range)  gentamicin cream (GARAMYCIN) 0.1 % 1 application. (1 application. Topical Given 03/29/21 1623)  heparin 2,500 Units in dialysis solution 1.5% low-MG/low-CA 5,000 mL dialysis solution (has no administration in time range)  heparin 2,500 Units in dialysis solution 2.5% low-MG/low-CA 5,000 mL dialysis solution (has no administration in time range)  dialysis solution 1.5% low-MG/low-CA dianeal solution (has no administration in time range)  dialysis solution 2.5% low-MG/low-CA dianeal solution (has no administration in time range)  metoCLOPramide (REGLAN) injection 10 mg (10 mg Intravenous Given 03/29/21 0531)  lactated ringers bolus 1,000 mL (0 mLs Intravenous Stopped 03/29/21 1313)  potassium chloride 10 mEq in 100 mL IVPB (0 mEq Intravenous Stopped 03/29/21 1115)  insulin glargine-yfgn (SEMGLEE) injection 8 Units (8 Units Subcutaneous  Given 03/29/21 1623)    Mobility walks Low fall risk   Focused Assessments Cardiac Assessment Handoff:    Lab Results  Component Value Date   CKTOTAL 295 (H) 03/08/2020   Lab Results  Component Value Date   DDIMER 0.93 (H) 09/15/2019   Does the Patient currently have chest pain? No    R Recommendations: See Admitting Provider Note  Report given to:   Additional Notes:

## 2021-03-29 NOTE — H&P (Signed)
Date: 03/29/2021               Patient Name:  Barbara Donovan MRN: [id number]  DOB: Jan 20, 1990 Age / Sex: 31 y.o., female   PCP: Scarlett Presto, MD         Medical Service: Internal Medicine Teaching Service         Attending Physician: Dr. Jimmye Norman, Elaina Pattee, MD    First Contact: Scarlett Presto, MD Pager: AD [phone]  Second Contact:  Virl Axe, MD Pager:  Sandrea Hammond [phone]       After Hours (After 5p/  First Contact Pager: [phone]  weekends / holidays): Second Contact Pager: [phone]   SUBJECTIVE   Chief Complaint: nausea vomiting  History of Present Illness:  Barbara Donovan is a 31 year old female with a history of T1DM, ESRD on PD since May 2022, HTN, and two recent hospitalizations for nausea and vomiting is coming in with a similar presentation of N/V.  When asked what she thinks is happening to bring her back in she saif she thinks she has bulimia. States that yesterday morning, she woke up feeling fine but she felt like she needed to or should vomit and forced herself to vomit. After vomiting once, she could not stop vomiting which led to her needing to come back to the hospital. This has happened in the past where she felt the urge to make herself vomit, however she says that her prior 2 admissions were not from her forcing herself to vomit. She does endorse some fear to eat food. Denies an uncontrollable urge to eat or inability to control how much she eats leading to needing to vomit. Since discharge, she was able to have soup Friday night. Thinks she could have tolerated solid foods at that time, but was not hungry and then after she vomited Saturday morning she couldn't tolerate more than water or gatorade. She has not identified foods that make her vomiting worse. The longest she has gone without vomiting is about 2 weeks. States that after long episodes of vomiting, she has noticed some dark, coffee-ground vomit but that resolves on its own. Has hiccups after vomiting.  Life is very  stressful for her. Her son is having surgery on Tuesday. He keeps having surgeries on his hands. Her ESRD is a lot to manage for her and it is getting the best of her. Sometimes has thoughts of hurting herself when she thinks she cannot take care of her son but she denies any homicidal or suicidal ideation and has no plans to carry out these thoughts. Vomiting makes her feel like she cannot take care of her son and she feels guilty about this. Grandmother takes care of her son (age 50) when she is here but he doesn't stay well with her (he cries). She started prozac about 2 weeks ago. Feels like she is vomiting more after starting it. States that she was told her nausea would improve after taking prozac repeatedly, but this has not happened.   Has been able to perform her PD at home and she has been able to take her insulin (same amount). She has long acting and with meals. For home medications to help with her nausea she has reglan, promethazine, and compazine. She has to swallow them and does not have any that would dissolve in her mouth. Does not think any particular nausea medications make her feel better than others. Does not know if she would be able to tolerate PO, but asking for water  and able to keep it down.   Meds:  No outpatient medications have been marked as taking for the 03/28/21 encounter [facility] Encounter).   No current facility-administered medications on file prior to encounter.   Current Outpatient Medications on File Prior to Encounter  Medication Sig Dispense Refill   amLODipine (NORVASC) 10 MG tablet Take 1 tablet (10 mg total) by mouth daily. 90 tablet 1   brimonidine (ALPHAGAN) 0.2 % ophthalmic solution Place 1 drop into the left eye 3 (three) times daily.     calcitRIOL (ROCALTROL) 0.5 MCG capsule Take 0.5 mcg by mouth daily.     carvedilol (COREG) 25 MG tablet Take 1 tablet (25 mg total) by mouth 2 (two) times daily. 180 tablet 3   Continuous Blood Gluc Receiver DEVI 1  Units by Does not apply route 4 (four) times daily -  before meals and at bedtime. May substitute for cheapest monitor with insurance 1 Units 0   Continuous Blood Gluc Sensor MISC 1 each by Does not apply route as directed. Use as directed every 14 days. May dispense FreeStyle [employer] or similar. (Patient not taking: Reported on 03/21/2021) 1 each 0   Continuous Glucose Monitor Sup MISC 1 Units by Does not apply route 4 (four) times daily -  before meals and at bedtime. May substitute for cheapest option with patient insurance 1 Units 0   FLUoxetine (PROZAC) 20 MG capsule Take 20 mg by mouth daily.     glucose blood (FREESTYLE TEST STRIPS) test strip Use as instructed (Patient not taking: Reported on 03/21/2021) 100 each 0   glucose blood (ONETOUCH VERIO) test strip 1 each by Other route 3 (three) times daily. And lancets 3/day (Patient taking differently: 1 each by Other route 4 (four) times daily -  before meals and at bedtime.) 300 each 3   hydrALAZINE (APRESOLINE) 50 MG tablet Take 1 tablet (50 mg total) by mouth 3 (three) times daily. (Patient taking differently: Take 50 mg by mouth in the morning and at bedtime.)     insulin glargine (LANTUS SOLOSTAR) 100 UNIT/ML Solostar Pen Inject 20 Units into the skin every morning. 15 mL 2   insulin lispro (HUMALOG KWIKPEN) 100 UNIT/ML KwikPen Inject 3 Units into the skin 3 (three) times daily with meals. 15 mL 3   metoCLOPramide (REGLAN) 5 MG tablet Take 1 tablet (5 mg total) by mouth 3 (three) times daily before meals. 90 tablet 0   multivitamin (RENA-VIT) TABS tablet Take 1 tablet by mouth at bedtime. 90 tablet 1   promethazine (PHENERGAN) 25 MG suppository Place 1 suppository (25 mg total) rectally every 6 (six) hours as needed for nausea or vomiting. (Patient not taking: Reported on 03/25/2021) 10 suppository 0   sevelamer carbonate (RENVELA) 800 MG tablet Take 1 tablet (800 mg total) by mouth 3 (three) times daily with meals. 90 tablet 1      Past Medical History:  Diagnosis Date   Asthma    as a child, no problems as an adult, no inhaler   Cataract    NS OU   Chronic hypertension during pregnancy, antepartum 08/19/2017   Dehydration 01/28/2018   Depression during pregnancy, antepartum 07/07/2017   6/20: Short trial of zoloft previously, reports didn't help much but also didn't give it a chance Discussed r/b/a SSRIs in pregnancy, agrees to try Zoloft again, rx sent No SI/HI/red flags   Diabetes ([place])    TYPE I. A1C 7.5% 05/31/20   Diabetic retinopathy ([place]) 06/09/2017  07/2017 with bilateral severe diabetic non-proliferative retinopathy with macular edema.   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 A2/B GDM)  Required Referrals for A1GDM or A2GDM: [x]  Diabetes Education and Testing Supplies [x]  Nutrition Cousult  For A2/B GDM or higher classes of DM [x]  Diabetes Education and Testing Supplies [x]  Nutrition Counsult [x]  Fetal ECHO after 20 weeks  [x]  Eye exam for retina evaluation - severe retinopathy 7/19  Base   Ventricular septal defect (VSD) of fetus in singleton pregnancy, antepartum 09/30/2017   May go to newborn nursery per Dr. Lenard Simmer Echo prior to discharge    Past Surgical History:  Procedure Laterality Date   25 GAUGE PARS PLANA VITRECTOMY WITH 20 GAUGE MVR PORT FOR MACULAR HOLE Left 07/20/2018   Procedure: 25 [place] VITRECTOMY LEFT EYE;  Surgeon: Bernarda Caffey, MD;  Location: [place];  Service: Ophthalmology;  Laterality: Left;   CAPD INSERTION N/A  09/10/2020   Procedure: LAPAROSCOPIC INSERTION CONTINUOUS AMBULATORY PERITONEAL DIALYSIS  (CAPD) CATHETER;  Surgeon: Serafina Mitchell, MD;  Location: [place];  Service: Vascular;  Laterality: N/A;   CESAREAN SECTION N/A 12/26/2017   Procedure: CESAREAN SECTION;  Surgeon: Osborne Oman, MD;  Location: [place];  Service: Obstetrics;  Laterality: N/A;   EYE EXAMINATION UNDER ANESTHESIA Right 07/20/2018   Procedure: Eye Exam Under Anesthesia RIGHT EYE;  Surgeon: Bernarda Caffey, MD;  Location: [place];  Service: Ophthalmology;  Laterality: Right;   EYE SURGERY Left 07/2018   GAS INSERTION Left 07/19/2019   Procedure: INSERTION OF GAS;  Surgeon: Bernarda Caffey, MD;  Location: [place];  Service: Ophthalmology;  Laterality: Left;  SF6   INJECTION OF SILICONE OIL Left 6/0/4540   Procedure: Injection Of Silicone Oil LEFT EYE;  Surgeon: Bernarda Caffey, MD;  Location: [place];  Service: Ophthalmology;  Laterality: Left;   IR FLUORO GUIDE CV LINE RIGHT  06/02/2020   IR US GUIDE VASC ACCESS RIGHT  06/02/2020   LASER PHOTO ABLATION Right 07/20/2018   Procedure: Laser Photo Ablation RIGHT EYE;  Surgeon: Bernarda Caffey, MD;  Location: [place];  Service: Ophthalmology;  Laterality: Right;   MEMBRANE PEEL Left 07/20/2018   Procedure: Membrane Peel LEFT EYE;  Surgeon: Bernarda Caffey, MD;  Location: [place];  Service: Ophthalmology;  Laterality: Left;   MEMBRANE PEEL Left 07/19/2019   Procedure: MEMBRANE PEEL;  Surgeon: Bernarda Caffey, MD;  Location: [place];  Service: Ophthalmology;  Laterality: Left;   MITOMYCIN C APPLICATION Bilateral 09/25/1189   Procedure: Avastin Application;  Surgeon: Bernarda Caffey, MD;  Location: [place];  Service: Ophthalmology;  Laterality: Bilateral;   PHOTOCOAGULATION WITH LASER Left 07/20/2018   Procedure: Photocoagulation With Laser LEFT EYE;  Surgeon: Bernarda Caffey, MD;  Location: [place];  Service: Ophthalmology;  Laterality: Left;   PHOTOCOAGULATION WITH LASER Left 07/19/2019   Procedure: PHOTOCOAGULATION  WITH LASER;  Surgeon: Bernarda Caffey, MD;  Location: [place];  Service: Ophthalmology;  Laterality: Left;   RETINAL DETACHMENT SURGERY Left 07/20/2018   Dr. Bernarda Caffey  SILICON OIL REMOVAL Left 07/19/2019   Procedure: 25g PARS PLANA VITRECTOMY WITH SILICON OIL REMOVAL;  Surgeon: Bernarda Caffey, MD;  Location: [place];  Service: Ophthalmology;  Laterality: Left;   WISDOM TOOTH EXTRACTION      Social:  Lives with son and boyfriend in [place] Denies alcohol, tobacco, or illicit drug use.  IADLs/ADLs- can person independently at baseline   Family History:  Mother- DM, seizures  Father- DM, COPD, MI  Maternal grandfather- stroke   Allergies: Allergies as of 03/28/2021   (No Known Allergies)    Review of Systems: A complete ROS was negative except as per HPI.   OBJECTIVE:   Physical Exam: Blood pressure (!) 198/105, pulse (!) 109, temperature 98.4 F (36.9 C), temperature source Oral, resp. rate 20, height 5\' 6"  (1.676 m), weight 68.5 kg, SpO2 99 %.  Constitutional: young woman shivering with cold holding an emesis bag, in no acute distress HEENT: normocephalic atraumatic, mucous membranes dry Cardiovascular: tachycardic, rhythm regular,  no m/r/g Pulmonary/Chest: normal work of breathing on room air, increased respiratory rate Abdominal: soft, non-tender, non-distended MSK: normal bulk and tone Neurological: alert & oriented x 3, answering questions appropriately Skin: warm and dry Psych: flat affect  Labs: CBC    Component Value Date/Time   WBC 6.7 03/28/2021 2315   RBC 4.72 03/28/2021 2315   HGB 13.6 03/29/2021 0514   HGB 9.7 (L) 12/22/2017 1459   HGB 13.0 07/07/2017 0000   HCT 40.0 03/29/2021 0514   HCT 28.5 (L) 12/22/2017 1459   HCT 39 07/07/2017 0000   PLT 446 (H) 03/28/2021 2315   PLT 427 12/22/2017 1459   PLT 374 07/07/2017 0000   MCV 85.2 03/28/2021 2315   MCV 82 12/22/2017 1459   MCH 27.8 03/28/2021 2315   MCHC 32.6 03/28/2021 2315   RDW 14.7 03/28/2021 2315    RDW 13.0 12/22/2017 1459   LYMPHSABS 1.0 03/28/2021 2315   MONOABS 0.3 03/28/2021 2315   EOSABS 0.0 03/28/2021 2315   BASOSABS 0.0 03/28/2021 2315     CMP     Component Value Date/Time   NA 132 (L) 03/29/2021 0514   NA 139 04/05/2019 1435   K 4.3 03/29/2021 0514   CL 95 (L) 03/28/2021 2315   CO2 17 (L) 03/28/2021 2315   GLUCOSE 198 (H) 03/28/2021 2315   BUN 42 (H) 03/28/2021 2315   BUN 31 (H) 04/05/2019 1435   CREATININE 9.69 (H) 03/28/2021 2315   CALCIUM 10.3 03/28/2021 2315   PROT 7.6 03/28/2021 2315   PROT 4.5 (L) 12/22/2017 1459   ALBUMIN 3.8 03/28/2021 2315   ALBUMIN 2.2 (L) 12/22/2017 1459   AST 16 03/28/2021 2315   ALT 16 03/28/2021 2315   ALKPHOS 48 03/28/2021 2315   BILITOT 0.6 03/28/2021 2315   BILITOT <0.2 12/22/2017 1459   GFRNONAA 5 (L) 03/28/2021 2315   GFRAA 30 (L) 09/15/2019 0358    Imaging: No results found.  EKG: personally reviewed my interpretation is sinus rhythm, prolonged QTc   ASSESSMENT & PLAN:    Assessment & Plan by Problem: Principal Problem:   Intractable nausea and vomiting   Barbara Donovan is a 31 y.o. with pertinent PMH of T1DM, ESRD on PD since May 2022, HTN, and two recent hospitalizations for nausea and vomiting is coming in with a similar presentation of N/V.  #Intractable N/V with multiple frequent hospitalizations Patient has been admitted twice with N/V in the last week. Some of her N/V symptoms occur spontaneously however some  also occur after she becomes dehydrated after self induced vomiting. Patient has responded/improved well with antiemetics during previous admissions. Expect patient will similarly improve with symptomatic treatment again today however her psychosocial factors will need to be addressed as they are a strong precipitating factor to her presentation. - small LR bolus  - clear liquid diet, advance as tolerated - reglan 10 mg Q6  - zofran 4 mg Q6 PRN and or ativan PRN if uncontrolled  #Anxiety and  Depression Upon chart review I see three separate antidepressants listed in the last three months for this patient (mirtazapine, lexapro, and prozac). Will try and clarify with patient who and when these were prescribed by. There is a note from the psychiatry team in January. Will reach out to them before DC to make sure she has close follow up. Patient is denying any SI. - continue prozac  #Prolonged QTc Patient has a history of prolonged Qtc and is receiving many antiemetics as above. Spoke with pharmacy about which antiemetics are at the greatest risk for QT prolongation. Doses of 16 mg of zofran are most concerning for prolongation and the reglan has less risk for it. Will continue to monitor with BID EKGs for now to monitor for stability. QT on morning check was 480. - BID EKG - use ativan as above to minimize Qtc prolongation  #T1DM #Elevated Anion Gap Patient has multiple reasons for elevated anion gap including starvation ketosis, DKA, and having missed peritoneal dialysis last night. Her glucose is in the normal range so will hold off on starting the endotool right now and continue to monitor her with long acting insulin. However will check a beta hydroxy butyrate. - f/u BHB - very sensitive SSI - 8 units long acting  #ESRD on Peritoneal Dialysis Spoke with nephro to arrange PD while admitted. - continue home PD  #Hypokalemia; Hyponatremia Sodium and potassium mildly low in the setting of N/V and poor PO intake. Given ESRD status will go slowly on the K repletion. - monitor PO intake - 500 mL of IV fluids - K repletion  Diet:  Clears VTE: Heparin IVF: LR,Bolus Code: Full  Prior to Admission Living Arrangement: Home Anticipated Discharge Location: Home Barriers to Discharge: Continued vomiting  Dispo: Admit patient to Observation with expected length of stay less than 2 midnights.  Signed: Scarlett Presto, MD Internal Medicine Resident PGY-1 Pager: [phone]  03/29/2021,  8:25 AM

## 2021-03-29 NOTE — Plan of Care (Signed)

## 2021-03-29 NOTE — ED Notes (Signed)
Pt drinking water for PO challenge

## 2021-03-29 NOTE — Plan of Care (Signed)
  Problem: Activity: Goal: Risk for activity intolerance will decrease Outcome: Progressing   Problem: Nutrition: Goal: Adequate nutrition will be maintained Outcome: Not Progressing   

## 2021-03-29 NOTE — ED Notes (Signed)
Lying: 192/107 ?              102 ?Sitting: 118/118 ?                   108 ?Standing:175/106   ?                    113 ?

## 2021-03-30 DIAGNOSIS — R112 Nausea with vomiting, unspecified: Principal | ICD-10-CM

## 2021-03-30 DIAGNOSIS — E876 Hypokalemia: Secondary | ICD-10-CM

## 2021-03-30 DIAGNOSIS — E1022 Type 1 diabetes mellitus with diabetic chronic kidney disease: Secondary | ICD-10-CM

## 2021-03-30 DIAGNOSIS — Z20822 Contact with and (suspected) exposure to covid-19: Secondary | ICD-10-CM | POA: Diagnosis not present

## 2021-03-30 DIAGNOSIS — E871 Hypo-osmolality and hyponatremia: Secondary | ICD-10-CM

## 2021-03-30 DIAGNOSIS — N186 End stage renal disease: Secondary | ICD-10-CM | POA: Diagnosis not present

## 2021-03-30 DIAGNOSIS — Z992 Dependence on renal dialysis: Secondary | ICD-10-CM

## 2021-03-30 DIAGNOSIS — F32A Depression, unspecified: Secondary | ICD-10-CM

## 2021-03-30 DIAGNOSIS — R9431 Abnormal electrocardiogram [ECG] [EKG]: Secondary | ICD-10-CM | POA: Diagnosis not present

## 2021-03-30 DIAGNOSIS — F419 Anxiety disorder, unspecified: Secondary | ICD-10-CM | POA: Diagnosis not present

## 2021-03-30 DIAGNOSIS — I120 Hypertensive chronic kidney disease with stage 5 chronic kidney disease or end stage renal disease: Secondary | ICD-10-CM | POA: Diagnosis not present

## 2021-03-30 LAB — CBC
HCT: 33.7 % — ABNORMAL LOW (ref 36.0–46.0)
Hemoglobin: 11.2 g/dL — ABNORMAL LOW (ref 12.0–15.0)
MCH: 28.2 pg (ref 26.0–34.0)
MCHC: 33.2 g/dL (ref 30.0–36.0)
MCV: 84.9 fL (ref 80.0–100.0)
Platelets: 361 10*3/uL (ref 150–400)
RBC: 3.97 MIL/uL (ref 3.87–5.11)
RDW: 14.9 % (ref 11.5–15.5)
WBC: 6.2 10*3/uL (ref 4.0–10.5)
nRBC: 0 % (ref 0.0–0.2)

## 2021-03-30 LAB — BASIC METABOLIC PANEL
Anion gap: 14 (ref 5–15)
BUN: 39 mg/dL — ABNORMAL HIGH (ref 6–20)
CO2: 21 mmol/L — ABNORMAL LOW (ref 22–32)
Calcium: 9.4 mg/dL (ref 8.9–10.3)
Chloride: 98 mmol/L (ref 98–111)
Creatinine, Ser: 9.48 mg/dL — ABNORMAL HIGH (ref 0.44–1.00)
GFR, Estimated: 5 mL/min — ABNORMAL LOW (ref >60)
Glucose, Bld: 147 mg/dL — ABNORMAL HIGH (ref 70–99)
Potassium: 3.6 mmol/L (ref 3.5–5.1)
Sodium: 133 mmol/L — ABNORMAL LOW (ref 135–145)

## 2021-03-30 LAB — GLUCOSE, CAPILLARY
Glucose-Capillary: 138 mg/dL — ABNORMAL HIGH (ref 70–99)
Glucose-Capillary: 159 mg/dL — ABNORMAL HIGH (ref 70–99)

## 2021-03-30 LAB — BETA-HYDROXYBUTYRIC ACID: Beta-Hydroxybutyric Acid: 0.32 mmol/L — ABNORMAL HIGH (ref 0.05–0.27)

## 2021-03-30 MED ORDER — INSULIN GLARGINE-YFGN 100 UNIT/ML ~~LOC~~ SOLN
14.0000 [IU] | Freq: Every day | SUBCUTANEOUS | Status: DC
Start: 2021-03-30 — End: 2021-03-30
  Filled 2021-03-30: qty 0.14

## 2021-03-30 MED ORDER — INSULIN GLARGINE-YFGN 100 UNIT/ML ~~LOC~~ SOLN
10.0000 [IU] | Freq: Every day | SUBCUTANEOUS | Status: DC
  Administered 2021-03-30: 10 [IU] via SUBCUTANEOUS
  Filled 2021-03-30: qty 0.1

## 2021-03-30 NOTE — Progress Notes (Addendum)
Inpatient Diabetes Program Recommendations ? ?AACE/ADA: New Consensus Statement on Inpatient Glycemic Control (2015) ? ?Target Ranges:  Prepandial:   less than 140 mg/dL ?     Peak postprandial:   less than 180 mg/dL (1-2 hours) ?     Critically ill patients:  140 - 180 mg/dL  ? ?Lab Results  ?Component Value Date  ? GLUCAP 138 (H) 03/30/2021  ? HGBA1C 7.6 (H) 03/25/2021  ? ? ?Review of Glycemic Control ? Latest Reference Range & Units 03/29/21 04:44 03/29/21 11:48 03/29/21 15:13 03/29/21 17:04 03/29/21 20:41 03/30/21 07:33  ?Glucose-Capillary 70 - 99 mg/dL 216 (H) 139 (H) 264 (H) 163 (H) 109 (H) 138 (H)  ? ?Starvation ketosis/N/V ESRD on PD ?Diabetes history: DM type 1 ?Outpatient Diabetes medications: Lantus 20 units, Humalog 3 units tid ?Current orders for Inpatient glycemic control:  ?Semglee 10 units Daily ?Novolog 0-6 units tid ? ?Inpatient Diabetes Program Recommendations:   ? ?MD note: pt received a total of Semglee 16 units yesterday ?-   Increase Semglee to 16 units ? ?Thanks, ? ?Tama Headings RN, MSN, BC-ADM ?Inpatient Diabetes Coordinator ?Team Pager [phone] (8a-5p) ? ? ?

## 2021-03-30 NOTE — TOC Transition Note (Signed)
Transition of Care (TOC) - CM/SW Discharge Note ? ? ?Patient Details  ?Name: Barbara Donovan ?MRN: [id number] ?Date of Birth: [birth year] ? ?Transition of Care (TOC) CM/SW Contact:  ?Tom-Johnson, Renea Ee, RN ?Phone Number: ?03/30/2021, 3:33 PM ? ? ?Clinical Narrative:    ? ?Patient is scheduled for discharge today. Recently admitted for DKA. Now admitted for Intractable nausea/vomiting. Patient is on Peritoneal Dialysis at home by [facility]. States she lives at home with her boyfriend. Both parents are deceased. Has one son. Has a sister that is supportive. Currently on disability. Independent with care and drive self prior to admission. Does not have DME's at home. PCP is Scarlett Presto, MD and uses [facility] on [address].  ?No recommendations noted and denies any needs. Boyfriend to transport at discharge. No further TOC needs noted. ? ?Final next level of care: Home/Self Care ?Barriers to Discharge: Barriers Resolved ? ? ?Patient Goals and CMS Choice ?Patient states their goals for this hospitalization and ongoing recovery are:: To return home ?CMS Medicare.gov Compare Post Acute Care list provided to:: Patient ?Choice offered to / list presented to : NA ? ?Discharge Placement ?  ?           ?  ?Patient to be transferred to facility by: Boyfriend ?  ?  ? ?Discharge Plan and Services ?  ?  ?           ?DME Arranged: N/A ?DME Agency: NA ?  ?  ?  ?HH Arranged: NA ?[place] Agency: NA ?  ?  ?  ? ?Social Determinants of Health (SDOH) Interventions ?  ? ? ?Readmission Risk Interventions ?Readmission Risk Prevention Plan 06/09/2020  ?Transportation Screening Complete  ?PCP or Specialist appointment within 3-5 days of discharge Complete  ?[place] or Home Care Consult Complete  ?SW Recovery Care/Counseling Consult Complete  ?Palliative Care Screening Not Applicable  ?[place] Not Applicable  ?Some recent data might be hidden  ? ? ? ? ? ?

## 2021-03-30 NOTE — Discharge Summary (Signed)
? ?Name: Barbara Donovan ?MRN: [id number] ?DOB: [birth year] 31 y.o. ?PCP: Scarlett Presto, MD ? ?Date of Admission: 03/28/2021 10:44 PM ?Date of Discharge:   03/30/21 ?Attending Physician: Angelica Pou, MD ? ?Discharge Diagnosis: ?1.  Intractable N/V with multiple frequent hospitalizations ?2. Anxiety and Depression ?3. Prolonged Qtc ?4. T1DM ?5. Elevated Anion Gap ?6. ESRD on Peritoneal Dialysis ?7. Hypokalemia; Hyponatremia ? ?Discharge Medications: ?Allergies as of 03/30/2021   ?No Known Allergies ?  ? ?  ?Medication List  ?  ? ?TAKE these medications   ? ?amLODipine 10 MG tablet ?Commonly known as: NORVASC ?Take 1 tablet (10 mg total) by mouth daily. ?  ?brimonidine 0.2 % ophthalmic solution ?Commonly known as: ALPHAGAN ?Place 1 drop into the left eye 3 (three) times daily. ?  ?calcitRIOL 0.5 MCG capsule ?Commonly known as: ROCALTROL ?Take 0.5 mcg by mouth daily. ?  ?carvedilol 25 MG tablet ?Commonly known as: COREG ?Take 1 tablet (25 mg total) by mouth 2 (two) times daily. ?  ?Continuous Blood Gluc Receiver Devi ?1 Units by Does not apply route 4 (four) times daily -  before meals and at bedtime. May substitute for cheapest monitor with insurance ?  ?Continuous Blood Gluc Sensor Misc ?1 each by Does not apply route as directed. Use as directed every 14 days. May dispense FreeStyle [employer] or similar. ?  ?Continuous Glucose Monitor Sup Misc ?1 Units by Does not apply route 4 (four) times daily -  before meals and at bedtime. May substitute for cheapest option with patient insurance ?  ?FLUoxetine 20 MG capsule ?Commonly known as: PROZAC ?Take 20 mg by mouth daily. ?  ?hydrALAZINE 50 MG tablet ?Commonly known as: APRESOLINE ?Take 1 tablet (50 mg total) by mouth 3 (three) times daily. ?What changed: when to take this ?  ?insulin lispro 100 UNIT/ML KwikPen ?Commonly known as: HumaLOG KwikPen ?Inject 3 Units into the skin 3 (three) times daily with meals. ?  ?Lantus SoloStar 100 UNIT/ML Solostar  Pen ?Generic drug: insulin glargine ?Inject 20 Units into the skin every morning. ?  ?metoCLOPramide 5 MG tablet ?Commonly known as: REGLAN ?Take 1 tablet (5 mg total) by mouth 3 (three) times daily before meals. ?  ?multivitamin Tabs tablet ?Take 1 tablet by mouth at bedtime. ?  ?OneTouch Verio test strip ?Generic drug: glucose blood ?1 each by Other route 3 (three) times daily. And lancets 3/day ?What changed:  ?when to take this ?additional instructions ?  ?FREESTYLE TEST STRIPS test strip ?Generic drug: glucose blood ?Use as instructed ?What changed: Another medication with the same name was changed. Make sure you understand how and when to take each. ?  ?promethazine 25 MG suppository ?Commonly known as: PHENERGAN ?Place 1 suppository (25 mg total) rectally every 6 (six) hours as needed for nausea or vomiting. ?  ?sevelamer carbonate 800 MG tablet ?Commonly known as: RENVELA ?Take 1 tablet (800 mg total) by mouth 3 (three) times daily with meals. ?  ? ?  ? ? ?Disposition and follow-up:   ?[name] was discharged from [facility] in Stable condition.  At the hospital follow up visit please address: ? ?1.  Anxiety/depression- has scheduled therapy and counseling. Make sure she is not experiencing any SI and is doing ok ? ?2. N/V- likely at least partially tied to the above problem. Inquire about her PO intake ? ?3. T1DM- previously prescribed a CGM, inquire if she was able to get it ? ?2.  Labs /  imaging needed at time of follow-up: bmp ? ?3.  Pending labs/ test needing follow-up: none ? ?Follow-up Appointments: ? ? ?Hospital Course by problem list: ?1.Intractable N/V with multiple frequent hospitalizations ?Patient has been admitted three times between 3/4-3/11. The initial two hospitalizations seem to be related to possible underlying gastroparesis/diabetes however this third admission was precipitated by self-induced vomiting that lead to organic nausea vomiting symptoms and  inability to manage her symptoms at home. She was treated with fluids and conservative symptom management and improved and was able tolerate a regular diet by 3/13. Patient reported to the team that she has all of her nausea medications at home. She will continue with her home medications and follow up in clinic.  ?  ?#Anxiety and Depression ?Spoke with patient who says she was originally started on lexapro which was switched to mirtazapine to help with appetite at a prior hospitalization. However she felt this medication made her very sleep and psychiatry switched it to prozac. She has both therapy and psychiatry follow up scheduled as an outpatient. Prozac was continued. ?  ?#Prolonged QTc ?Patient has a history of prolonged Qtc whic hwas 470-480 during admission. This was monitored closely with EKGs until her QT was deemed stable in the setting of antiemetics. ? ?#T1DM ?#Elevated Anion Gap ?Anion gap on admission was 23. Patient had multiple reasons for elevated anion gap including starvation ketosis, DKA, and having missed peritoneal dialysis. Her glucose was in the normal range, gave her long acting insulin and slowly advanced her diet and her anion gap improved. She will follow up with her endocrinologist as well as with [facility]. ?  ?#ESRD on Peritoneal Dialysis ?Continued PD while admitted ?  ?#Hypokalemia; Hyponatremia ?Sodium and potassium mildly low in the setting of N/V and poor PO intake. Given ESRD status K was slowly repleted and monitored with BMPs. These improved. ? ?Discharge Subjective ?Patient says that she thought about what we talked about yesterday and that since she is on disability she has a lot of time to herself to think and that is not good for her. She said that her son's surgery got pushed back to the end of this month which is a good thing. She also has a follow up already scheduled for therapy this Thursday.  ? ?Discharge Exam:   ?BP 133/87 (BP Location: Right Arm)   Pulse 82   Temp 98.3  ?F (36.8 ?C)   Resp 16   Ht 5\' 6"  (1.676 m)   Wt 66.2 kg   SpO2 100%   BMI 23.57 kg/m?  ?Discharge exam:  ?Gen: WNWD woman resting comfortably in bed ?CV:RRR ?Resp: normal WOB on room air ?Abd: soft, nontender, nondistended ?Neuro: alert, oriented, answering questions appropriately ?Skin:warm and dry ?Psych: normal affect ? ?Pertinent Labs, Studies, and Procedures:  ? ?No results found.  ?CBC Latest Ref Rng & Units 03/30/2021 03/29/2021 03/28/2021  ?WBC 4.0 - 10.5 K/uL 6.2 - 6.7  ?Hemoglobin 12.0 - 15.0 g/dL 11.2(L) 13.6 13.1  ?Hematocrit 36.0 - 46.0 % 33.7(L) 40.0 40.2  ?Platelets 150 - 400 K/uL 361 - 446(H)  ?  ?BMP Latest Ref Rng & Units 03/30/2021 03/29/2021 03/29/2021  ?Glucose 70 - 99 mg/dL 147(H) 127(H) 193(H)  ?BUN 6 - 20 mg/dL 39(H) 43(H) 44(H)  ?Creatinine 0.44 - 1.00 mg/dL 9.48(H) 9.45(H) 9.78(H)  ?BUN/Creat Ratio 9 - 23 - - -  ?Sodium 135 - 145 mmol/L 133(L) 129(L) 132(L)  ?Potassium 3.5 - 5.1 mmol/L 3.6 3.2(L) 3.3(L)  ?Chloride 98 -  111 mmol/L 98 93(L) 94(L)  ?CO2 22 - 32 mmol/L 21(L) 22 18(L)  ?Calcium 8.9 - 10.3 mg/dL 9.4 9.2 10.1  ?  ?Discharge Instructions: ?Discharge Instructions   ? ? Call MD for:  persistant dizziness or light-headedness   Complete by: As directed ?  ? Call MD for:  persistant nausea and vomiting   Complete by: As directed ?  ? Call MD for:  temperature >100.4   Complete by: As directed ?  ? Diet - low sodium heart healthy   Complete by: As directed ?  ? Increase activity slowly   Complete by: As directed ?  ? No wound care   Complete by: As directed ?  ? ?  ? ? ?Signed: ?Scarlett Presto, MD ?03/30/2021, 12:58 PM   ?Pager: [phone]  ? ?

## 2021-03-30 NOTE — Discharge Instructions (Signed)
Darla Lesches ? ?You were recently admitted to [facility] for nausea vomiting. We gave you medications to help you feel better and discussed the stressful things going on in your life.  ? ?Continue taking your home medications with the following changes ? ?You should seek further medical care if you have worsening stress and thoughts of wanting to hurt yourself, nausea and vomiting, or worsening anxiety or depression. ? ?We want to see you in the next couple of days in our clinic to make sure you continue to improve. Make sure to attend your upcoming therapy appointments. ? ?Sincerely, ?Scarlett Presto, MD ? ? ?

## 2021-03-30 NOTE — Progress Notes (Signed)
Subjective: Seen and examined in room no current complaints this a.m., tolerating PD still on cycler, started late last night,  denies any nausea vomiting now PD ? ?Objective ?Vital signs in last 24 hours: ?Vitals:  ? 03/29/21 1805 03/29/21 2103 03/30/21 0337 03/30/21 0838  ?BP: (!) 170/98 (!) 146/94 126/86 133/87  ?Pulse: 92 91 84 82  ?Resp: 18 18 18 16   ?Temp: (!) 100.6 ?F (38.1 ?C) 98.8 ?F (37.1 ?C) 98.7 ?F (37.1 ?C) 98.3 ?F (36.8 ?C)  ?TempSrc: Oral Oral Oral   ?SpO2: 100% 99% 97% 100%  ?Weight: 66.2 kg     ?Height:      ? ?Weight change: -2.275 kg ? ?Physical Exam: ?General: Alert young female NAD ?Heart: RRR no MRG ?Lungs: CTA, nonlabored breathing ?Abdomen: NABS, soft NTND ?Extremities: No pedal edema  ?dialysis Access: PD cath in abdomen nontender ? ? ? Home meds include - norvasc, coreg 25 bid, hydralazine 50 tid, insulin lantus/ humalog, reglan tid, renavit, renvela 800 tid ?  ?  ?  ? OP PD: 5 exchange x 2 L each overnight, 1.5h each, edw 63kg, no daybag ? - IV fe and mircera ordered, not sure if given ? - usually uses half 1.5% and half 2.5% fluids ? ?Problem/Plan: ?Nausea/vomiting= work-up per PMD, no current PD problems ?ESRD -continue CCPD nightly, a.m. lab K3.6 CR 9.48 BUN 39 ?HTN/volume -BP stable, no excess volume continue meds using PD bags 1.5 %and one 2.5 % as at home ?Anemia -Hgb 11.2 no ESA needs ?Secondary hyperparathyroidism -calcium stable, continue binders with meals ?DM1 per MD ? ?Ernest Haber, PA-C ?[facility] ?Beeper [phone] ?03/30/2021,10:12 AM ? LOS: 0 days  ? ?Labs: ?Basic Metabolic Panel: ?Recent Labs  ?Lab 03/25/21 ?1817 03/25/21 ?2134 03/29/21 ?3810 03/29/21 ?2013 03/30/21 ?0515  ?NA 138   < > 132* 129* 133*  ?K 3.3*   < > 3.3* 3.2* 3.6  ?CL 104   < > 94* 93* 98  ?CO2 18*   < > 18* 22 21*  ?GLUCOSE 128*   < > 193* 127* 147*  ?BUN 49*   < > 44* 43* 39*  ?CREATININE 9.52*   < > 9.78* 9.45* 9.48*  ?CALCIUM 9.4   < > 10.1 9.2 9.4  ?PHOS 7.3*  --   --   --   --   ? < >  = values in this interval not displayed.  ? ?Liver Function Tests: ?Recent Labs  ?Lab 03/25/21 ?1751 03/25/21 ?1817 03/28/21 ?2315  ?AST 16  --  16  ?ALT 15  --  16  ?ALKPHOS 52  --  48  ?BILITOT 0.5  --  0.6  ?PROT 7.8  --  7.6  ?ALBUMIN 3.8 3.3* 3.8  ? ?Recent Labs  ?Lab 03/25/21 ?0258 03/28/21 ?2315  ?LIPASE 52* 44  ? ?No results for input(s): AMMONIA in the last 168 hours. ?CBC: ?Recent Labs  ?Lab 03/25/21 ?5277 03/25/21 ?1120 03/28/21 ?2315 03/29/21 ?8242 03/30/21 ?0515  ?WBC 5.2  --  6.7  --  6.2  ?NEUTROABS  --   --  5.3  --   --   ?HGB 13.1   < > 13.1 13.6 11.2*  ?HCT 40.5   < > 40.2 40.0 33.7*  ?MCV 85.1  --  85.2  --  84.9  ?PLT 413*  --  446*  --  361  ? < > = values in this interval not displayed.  ? ?Cardiac Enzymes: ?No results for input(s): CKTOTAL, CKMB, CKMBINDEX, TROPONINI  in the last 168 hours. ?CBG: ?Recent Labs  ?Lab 03/29/21 ?1148 03/29/21 ?1513 03/29/21 ?1704 03/29/21 ?2041 03/30/21 ?8657  ?GLUCAP 139* 264* 163* 109* 138*  ? ? ?Studies/Results: ?No results found. ?Medications: ? dialysis solution 1.5% low-MG/low-CA    ? dialysis solution 2.5% low-MG/low-CA    ? ? amLODipine  10 mg Oral Daily  ? carvedilol  25 mg Oral BID  ? FLUoxetine  20 mg Oral Daily  ? gentamicin cream  1 application. Topical Daily  ? heparin  5,000 Units Subcutaneous Q8H  ? hydrALAZINE  50 mg Oral TID  ? insulin aspart  0-6 Units Subcutaneous TID WC  ? insulin glargine-yfgn  10 Units Subcutaneous Daily  ? metoCLOPramide (REGLAN) injection  10 mg Intravenous Q6H  ? ? ? ? ?

## 2021-04-02 ENCOUNTER — Other Ambulatory Visit: Payer: Self-pay

## 2021-04-02 ENCOUNTER — Ambulatory Visit (INDEPENDENT_AMBULATORY_CARE_PROVIDER_SITE_OTHER): Payer: 59 | Admitting: Psychologist

## 2021-04-02 DIAGNOSIS — F321 Major depressive disorder, single episode, moderate: Secondary | ICD-10-CM | POA: Diagnosis not present

## 2021-04-02 DIAGNOSIS — F411 Generalized anxiety disorder: Secondary | ICD-10-CM

## 2021-04-02 NOTE — Progress Notes (Signed)
                Loucile Posner, PsyD 

## 2021-04-02 NOTE — Plan of Care (Signed)

## 2021-04-02 NOTE — Progress Notes (Signed)
[place] Counselor Initial Adult Exam ? ?Name: Barbara Donovan ?Date: 04/02/2021 ?MRN: [id number] ?DOB: 1990/06/18 ?PCP: Scarlett Presto, MD ? ?Time spent: 9:05 am to 9:44 am; total time: 39 minutes ? ?This session was held via in person. The patient consented to in-person therapy and was in the clinician's office. Limits of confidentiality were discussed with the patient.  ? ?Guardian/Payee:  NA   ? ?Paperwork requested: No  ? ?Reason for Visit /Presenting Problem: Anxiety and depression ? ?Mental Status Exam: ?Appearance:   Well Groomed     ?Behavior:  Appropriate  ?Motor:  Normal  ?Speech/Language:   Clear and Coherent  ?Affect:  Appropriate  ?Mood:  normal  ?Thought process:  normal  ?Thought content:    WNL  ?Sensory/Perceptual disturbances:    WNL  ?Orientation:  oriented to person, place, and time/date  ?Attention:  Good  ?Concentration:  Good  ?Memory:  WNL  ?Fund of knowledge:   Good  ?Insight:    Fair  ?Judgment:   Fair  ?Impulse Control:  Good  ? ? ?Reported Symptoms:  The patient endorsed experiencing the following: racing thoughts, feeling on edge, feeling restless, shortness of breath, heart palpitations, and feeling easily overwhelmed. She denied suicidal and homicidal ideation. ? ?The patient endorsed experiencing the following: feeling down, sad, tearful, rumination of negative thoughts, low self-esteem, social isolation, avoiding pleasurable activities, and thoughts of hopelessness and worthlessness. She denied suicidal and homicidal ideation.  ? ?Risk Assessment: ?Danger to Self:  No ?Self-injurious Behavior: No ?Danger to Others: No ?Duty to Warn:no ?Physical Aggression / Violence:No  ?Access to Firearms a concern: No  ?Gang Involvement:No  ?Patient / guardian was educated about steps to take if suicide or homicide risk level increases between visits: n/a ?While future psychiatric events cannot be accurately predicted, the patient does not currently require acute inpatient  psychiatric care and does not currently meet [facility] involuntary commitment criteria. ? ?Substance Abuse History: ?Current substance abuse: No    ? ?Past Psychiatric History:   ?Previous psychological history is significant for grief ?Outpatient Providers:NA ?History of Psych Hospitalization: No  ?Psychological Testing:  NA   ? ?Abuse History:  ?Victim of: No.,  NA    ?Report needed: No. ?Victim of Neglect:No. ?Perpetrator of  NA   ?Witness / Exposure to Domestic Violence: No   ?Protective Services Involvement: No  ?Witness to [employer] Violence:  No  ? ?Family History:  ?Family History  ?Problem Relation Age of Onset  ? Diabetes Mother   ? Aneurysm Mother 57  ? Seizures Mother   ? Diabetes Father   ? Cataracts Father   ? COPD Father   ? Heart attack Father   ? Heart disease Father   ? Healthy Sister   ? Healthy Daughter   ? Stroke Maternal Grandfather   ? Amblyopia Neg Hx   ? Blindness Neg Hx   ? Glaucoma Neg Hx   ? Macular degeneration Neg Hx   ? Retinal detachment Neg Hx   ? Strabismus Neg Hx   ? Retinitis pigmentosa Neg Hx   ? Colon cancer Neg Hx   ? Stomach cancer Neg Hx   ? Esophageal cancer Neg Hx   ? Pancreatic cancer Neg Hx   ? Liver disease Neg Hx   ? ? ?Living situation: the patient lives with their family ? ?Sexual Orientation: Straight ? ?Relationship Status: Patient has had a boyfriend for the last 4 years.   ?Name of spouse / other:Reggie ?If  a parent, number of children / ages:Patient has a three year old son named Rolla Plate who has some special needs.  ? ?Support Systems: family ? ?Financial Stress:  Yes  ? ?Income/Employment/Disability: Social Security Disability ? ?Military Service: No  ? ?Educational History: ?Education: college graduate ? ?Religion/Sprituality/World View: ?Christian  ? ?Any cultural differences that may affect / interfere with treatment:  not applicable  ? ?Recreation/Hobbies: Spending time with Rolla Plate ? ?Stressors: Other: Caring for son   ? ?Strengths: Supportive  Relationships ? ?Barriers:  NA  ? ?Legal History: ?Pending legal issue / charges: The patient has no significant history of legal issues. ?History of legal issue / charges:  NA ? ?Medical History/Surgical History: reviewed ?Past Medical History:  ?Diagnosis Date  ? Asthma   ? as a child, no problems as an adult, no inhaler  ? Cataract   ? NS OU  ? Chronic hypertension during pregnancy, antepartum 08/19/2017  ? Dehydration 01/28/2018  ? Depression during pregnancy, antepartum 07/07/2017  ? 6/20: Short trial of zoloft previously, reports didn't help much but also didn't give it a chance Discussed r/b/a SSRIs in pregnancy, agrees to try Zoloft again, rx sent No SI/HI/red flags  ? Diabetes ([place])   ? TYPE I. A1C 7.5% 05/31/20  ? Diabetic retinopathy ([place]) 06/09/2017  ? 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  ? ESRD on peritoneal dialysis [facility])   ? HTN (hypertension)   ? Hypertensive retinopathy   ? OU  ? Hypokalemia 01/22/2018  ? Hypomagnesemia 01/28/2018  ? Intractable nausea and vomiting 01/22/2018  ? Intrauterine growth restriction (IUGR) affecting care of mother 12/22/2017  ? Morbid obesity ([place])   ? 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 Severe hyperemesis gravidarum 10/30/2017  ? Type I diabetes mellitus ([place]) 07/07/2017  ? Current Diabetic Medications:  Insulin  [x]  Aspirin 81 mg daily after 12 weeks (? A2/B GDM)  Required Referrals for A1GDM or A2GDM: [x]  Diabetes Education and Testing Supplies [x]  Nutrition Cousult  For A2/B GDM or higher classes of DM [x]  Diabetes Education and Testing Supplies [x]  Nutrition Counsult [x]  Fetal ECHO after 20 weeks  [x]  Eye exam for retina evaluation - severe retinopathy 7/19  Base  ? Ventricular septal defect (VSD) of fetus in singleton pregnancy, antepartum 09/30/2017  ? May go to newborn nursery per Dr. Lenard Simmer Echo prior to discharge  ? ? ?Past Surgical History:  ?Procedure Laterality Date  ? 25 GAUGE PARS PLANA VITRECTOMY WITH  20 GAUGE MVR PORT FOR MACULAR HOLE Left 07/20/2018  ? Procedure: [place] VITRECTOMY LEFT EYE;  Surgeon: Bernarda Caffey, MD;  Location: [place];  Service: Ophthalmology;  Laterality: Left;  ? CAPD INSERTION N/A 09/10/2020  ? Procedure: LAPAROSCOPIC INSERTION CONTINUOUS AMBULATORY PERITONEAL DIALYSIS  (CAPD) CATHETER;  Surgeon: Serafina Mitchell, MD;  Location: [place];  Service: Vascular;  Laterality: N/A;  ? CESAREAN SECTION N/A 12/26/2017  ? Procedure: CESAREAN SECTION;  Surgeon: Osborne Oman, MD;  Location: [place];  Service: Obstetrics;  Laterality: N/A;  ? EYE EXAMINATION UNDER ANESTHESIA Right 07/20/2018  ? Procedure: Eye Exam Under Anesthesia RIGHT EYE;  Surgeon: Bernarda Caffey, MD;  Location: [place];  Service: Ophthalmology;  Laterality: Right;  ? EYE SURGERY Left 07/2018  ? GAS INSERTION Left 07/19/2019  ? Procedure: INSERTION OF GAS;  Surgeon: Bernarda Caffey, MD;  Location: [place];  Service: Ophthalmology;  Laterality: Left;  SF6  ? INJECTION OF SILICONE OIL Left 02/22/971  ? Procedure: Injection  Of Silicone Oil LEFT EYE;  Surgeon: Bernarda Caffey, MD;  Location: [place];  Service: Ophthalmology;  Laterality: Left;  ? IR FLUORO GUIDE CV LINE RIGHT  06/02/2020  ? IR US GUIDE VASC ACCESS RIGHT  06/02/2020  ? LASER PHOTO ABLATION Right 07/20/2018  ? Procedure: Laser Photo Ablation RIGHT EYE;  Surgeon: Bernarda Caffey, MD;  Location: [place];  Service: Ophthalmology;  Laterality: Right;  ? MEMBRANE PEEL Left 07/20/2018  ? Procedure: Membrane Peel LEFT EYE;  Surgeon: Bernarda Caffey, MD;  Location: [place];  Service: Ophthalmology;  Laterality: Left;  ? MEMBRANE PEEL Left 07/19/2019  ? Procedure: MEMBRANE PEEL;  Surgeon: Bernarda Caffey, MD;  Location: [place];  Service: Ophthalmology;  Laterality: Left;  ? MITOMYCIN C APPLICATION Bilateral 05/25/636  ? Procedure: Avastin Application;  Surgeon: Bernarda Caffey, MD;  Location: [place];  Service: Ophthalmology;  Laterality: Bilateral;  ? PHOTOCOAGULATION WITH LASER Left 07/20/2018  ?  Procedure: Photocoagulation With Laser LEFT EYE;  Surgeon: Bernarda Caffey, MD;  Location: [place];  Service: Ophthalmology;  Laterality: Left;  ? PHOTOCOAGULATION WITH LASER Left 07/19/2019  ? Procedure: PHOTOCOAGULATION WITH

## 2021-04-05 ENCOUNTER — Other Ambulatory Visit: Payer: Self-pay

## 2021-04-05 ENCOUNTER — Observation Stay (HOSPITAL_COMMUNITY)
Admission: EM | Admit: 2021-04-05 | Discharge: 2021-04-07 | Disposition: A | Discharge status: Home / Self Care | Payer: Commercial Managed Care - HMO | Attending: Internal Medicine | Admitting: Internal Medicine

## 2021-04-05 ENCOUNTER — Encounter (HOSPITAL_COMMUNITY): Payer: Self-pay

## 2021-04-05 ENCOUNTER — Emergency Department (HOSPITAL_COMMUNITY): Payer: Commercial Managed Care - HMO

## 2021-04-05 DIAGNOSIS — I120 Hypertensive chronic kidney disease with stage 5 chronic kidney disease or end stage renal disease: Secondary | ICD-10-CM | POA: Insufficient documentation

## 2021-04-05 DIAGNOSIS — R9431 Abnormal electrocardiogram [ECG] [EKG]: Secondary | ICD-10-CM | POA: Insufficient documentation

## 2021-04-05 DIAGNOSIS — Z8249 Family history of ischemic heart disease and other diseases of the circulatory system: Secondary | ICD-10-CM

## 2021-04-05 DIAGNOSIS — E860 Dehydration: Secondary | ICD-10-CM | POA: Insufficient documentation

## 2021-04-05 DIAGNOSIS — R1115 Cyclical vomiting syndrome unrelated to migraine: Principal | ICD-10-CM | POA: Insufficient documentation

## 2021-04-05 DIAGNOSIS — Z833 Family history of diabetes mellitus: Secondary | ICD-10-CM

## 2021-04-05 DIAGNOSIS — Z823 Family history of stroke: Secondary | ICD-10-CM

## 2021-04-05 DIAGNOSIS — F32A Depression, unspecified: Secondary | ICD-10-CM | POA: Diagnosis present

## 2021-04-05 DIAGNOSIS — R112 Nausea with vomiting, unspecified: Secondary | ICD-10-CM | POA: Diagnosis not present

## 2021-04-05 DIAGNOSIS — E1022 Type 1 diabetes mellitus with diabetic chronic kidney disease: Secondary | ICD-10-CM | POA: Diagnosis not present

## 2021-04-05 DIAGNOSIS — N186 End stage renal disease: Secondary | ICD-10-CM | POA: Insufficient documentation

## 2021-04-05 DIAGNOSIS — E10319 Type 1 diabetes mellitus with unspecified diabetic retinopathy without macular edema: Secondary | ICD-10-CM | POA: Insufficient documentation

## 2021-04-05 DIAGNOSIS — Z794 Long term (current) use of insulin: Secondary | ICD-10-CM | POA: Insufficient documentation

## 2021-04-05 DIAGNOSIS — E8729 Other acidosis: Secondary | ICD-10-CM | POA: Diagnosis present

## 2021-04-05 DIAGNOSIS — Z992 Dependence on renal dialysis: Secondary | ICD-10-CM | POA: Diagnosis not present

## 2021-04-05 DIAGNOSIS — F5089 Other specified eating disorder: Secondary | ICD-10-CM | POA: Diagnosis present

## 2021-04-05 DIAGNOSIS — R490 Dysphonia: Secondary | ICD-10-CM | POA: Diagnosis present

## 2021-04-05 DIAGNOSIS — E1110 Type 2 diabetes mellitus with ketoacidosis without coma: Secondary | ICD-10-CM

## 2021-04-05 DIAGNOSIS — E1010 Type 1 diabetes mellitus with ketoacidosis without coma: Secondary | ICD-10-CM | POA: Insufficient documentation

## 2021-04-05 DIAGNOSIS — Z825 Family history of asthma and other chronic lower respiratory diseases: Secondary | ICD-10-CM

## 2021-04-05 DIAGNOSIS — Z79899 Other long term (current) drug therapy: Secondary | ICD-10-CM

## 2021-04-05 DIAGNOSIS — F419 Anxiety disorder, unspecified: Secondary | ICD-10-CM | POA: Diagnosis present

## 2021-04-05 DIAGNOSIS — E1065 Type 1 diabetes mellitus with hyperglycemia: Secondary | ICD-10-CM | POA: Diagnosis present

## 2021-04-05 DIAGNOSIS — K3184 Gastroparesis: Secondary | ICD-10-CM | POA: Diagnosis present

## 2021-04-05 LAB — COMPREHENSIVE METABOLIC PANEL
ALT: 13 U/L (ref 0–44)
AST: 15 U/L (ref 15–41)
Albumin: 3.4 g/dL — ABNORMAL LOW (ref 3.5–5.0)
Alkaline Phosphatase: 42 U/L (ref 38–126)
Anion gap: 20 — ABNORMAL HIGH (ref 5–15)
BUN: 40 mg/dL — ABNORMAL HIGH (ref 6–20)
CO2: 17 mmol/L — ABNORMAL LOW (ref 22–32)
Calcium: 9.5 mg/dL (ref 8.9–10.3)
Chloride: 97 mmol/L — ABNORMAL LOW (ref 98–111)
Creatinine, Ser: 9.25 mg/dL — ABNORMAL HIGH (ref 0.44–1.00)
GFR, Estimated: 5 mL/min — ABNORMAL LOW (ref >60)
Glucose, Bld: 211 mg/dL — ABNORMAL HIGH (ref 70–99)
Potassium: 3.3 mmol/L — ABNORMAL LOW (ref 3.5–5.1)
Sodium: 134 mmol/L — ABNORMAL LOW (ref 135–145)
Total Bilirubin: 0.5 mg/dL (ref 0.3–1.2)
Total Protein: 6.6 g/dL (ref 6.5–8.1)

## 2021-04-05 LAB — CBC WITH DIFFERENTIAL/PLATELET
Abs Immature Granulocytes: 0.07 10*3/uL (ref 0.00–0.07)
Basophils Absolute: 0 10*3/uL (ref 0.0–0.1)
Basophils Relative: 1 %
Eosinophils Absolute: 0 10*3/uL (ref 0.0–0.5)
Eosinophils Relative: 0 %
HCT: 38.7 % (ref 36.0–46.0)
Hemoglobin: 12.9 g/dL (ref 12.0–15.0)
Immature Granulocytes: 1 %
Lymphocytes Relative: 13 %
Lymphs Abs: 1.1 10*3/uL (ref 0.7–4.0)
MCH: 28.2 pg (ref 26.0–34.0)
MCHC: 33.3 g/dL (ref 30.0–36.0)
MCV: 84.7 fL (ref 80.0–100.0)
Monocytes Absolute: 0.6 10*3/uL (ref 0.1–1.0)
Monocytes Relative: 7 %
Neutro Abs: 6.7 10*3/uL (ref 1.7–7.7)
Neutrophils Relative %: 78 %
Platelets: 439 10*3/uL — ABNORMAL HIGH (ref 150–400)
RBC: 4.57 MIL/uL (ref 3.87–5.11)
RDW: 14.7 % (ref 11.5–15.5)
WBC: 8.4 10*3/uL (ref 4.0–10.5)
nRBC: 0.2 % (ref 0.0–0.2)

## 2021-04-05 LAB — TROPONIN I (HIGH SENSITIVITY): Troponin I (High Sensitivity): 23 ng/L — ABNORMAL HIGH (ref <18)

## 2021-04-05 LAB — LIPASE, BLOOD: Lipase: 44 U/L (ref 11–51)

## 2021-04-05 MED ORDER — CARVEDILOL 12.5 MG PO TABS: 25.0000 mg | ORAL_TABLET | Freq: Two times a day (BID) | ORAL | Status: DC

## 2021-04-05 MED ORDER — SODIUM CHLORIDE 0.9 % IV BOLUS
1000.0000 mL | Freq: Once | INTRAVENOUS | Status: AC
  Administered 2021-04-05: 1000 mL via INTRAVENOUS

## 2021-04-05 MED ORDER — AMLODIPINE BESYLATE 5 MG PO TABS: 10.0000 mg | ORAL_TABLET | Freq: Every day | ORAL | Status: DC

## 2021-04-05 MED ORDER — HYDRALAZINE HCL 50 MG PO TABS
50.0000 mg | ORAL_TABLET | Freq: Three times a day (TID) | ORAL | Status: DC
  Administered 2021-04-06 – 2021-04-07 (×5): 50 mg via ORAL
  Filled 2021-04-05 (×3): qty 1
  Filled 2021-04-05 (×2): qty 2

## 2021-04-05 MED ORDER — BRIMONIDINE TARTRATE 0.2 % OP SOLN
1.0000 [drp] | Freq: Three times a day (TID) | OPHTHALMIC | Status: DC
  Administered 2021-04-06 – 2021-04-07 (×3): 1 [drp] via OPHTHALMIC
  Filled 2021-04-05: qty 5

## 2021-04-05 MED ORDER — HEPARIN SODIUM (PORCINE) 5000 UNIT/ML IJ SOLN
5000.0000 [IU] | Freq: Three times a day (TID) | INTRAMUSCULAR | Status: DC
  Administered 2021-04-06 – 2021-04-07 (×5): 5000 [IU] via SUBCUTANEOUS
  Filled 2021-04-05 (×5): qty 1

## 2021-04-05 MED ORDER — AMLODIPINE BESYLATE 10 MG PO TABS
10.0000 mg | ORAL_TABLET | Freq: Every day | ORAL | Status: DC
  Administered 2021-04-06 (×2): 10 mg via ORAL
  Filled 2021-04-05: qty 2
  Filled 2021-04-05: qty 1

## 2021-04-05 MED ORDER — ONDANSETRON HCL 4 MG/2ML IJ SOLN: 4.0000 mg | Freq: Four times a day (QID) | INTRAMUSCULAR | Status: DC | PRN

## 2021-04-05 MED ORDER — METOCLOPRAMIDE HCL 5 MG/ML IJ SOLN
5.0000 mg | Freq: Once | INTRAMUSCULAR | Status: DC
  Administered 2021-04-05: 5 mg via INTRAVENOUS
  Filled 2021-04-05: qty 2

## 2021-04-05 MED ORDER — FLUOXETINE HCL 20 MG PO CAPS
20.0000 mg | ORAL_CAPSULE | Freq: Every day | ORAL | Status: DC
  Administered 2021-04-06 – 2021-04-07 (×2): 20 mg via ORAL
  Filled 2021-04-05 (×2): qty 1

## 2021-04-05 MED ORDER — SEVELAMER CARBONATE 800 MG PO TABS
800.0000 mg | ORAL_TABLET | Freq: Three times a day (TID) | ORAL | Status: DC
  Administered 2021-04-06 – 2021-04-07 (×5): 800 mg via ORAL
  Filled 2021-04-05 (×5): qty 1

## 2021-04-05 MED ORDER — METOCLOPRAMIDE HCL 5 MG/ML IJ SOLN
10.0000 mg | Freq: Once | INTRAMUSCULAR | Status: AC
  Administered 2021-04-05: 5 mg via INTRAVENOUS

## 2021-04-05 MED ORDER — ONDANSETRON HCL 4 MG PO TABS: 4.0000 mg | ORAL_TABLET | Freq: Four times a day (QID) | ORAL | Status: DC | PRN

## 2021-04-05 MED ORDER — CALCITRIOL 0.5 MCG PO CAPS
0.5000 ug | ORAL_CAPSULE | Freq: Every day | ORAL | Status: DC
  Administered 2021-04-06 – 2021-04-07 (×2): 0.5 ug via ORAL
  Filled 2021-04-05 (×2): qty 1

## 2021-04-05 NOTE — ED Notes (Signed)
This RN and MD noticed pt's pulse ox 60% RA, pt stated she was having difficulty breathing, this RN placed pt on O2 and raised HOB. Pt's O2 now 100% on 2L ?

## 2021-04-05 NOTE — H&P (Addendum)
? ? ? ?Date: 04/05/2021     ?     ?     ?Patient Name:  Barbara Donovan MRN: [id number]  ?DOB: 19-Jul-1990 Age / Sex: 31 y.o., female   ?PCP: Scarlett Presto, MD    ?     ?Medical Service: Internal Medicine Teaching Service    ?     ?Attending Physician: Dr. Lottie Mussel, MD    ?First Contact: Dr. Ileene Musa  Pager: [phone]  ?Second Contact: Dr. Allyson Sabal Pager: [phone]  ?     ?After Hours (After 5p/  First Contact Pager: [phone]  ?weekends / holidays): Second Contact Pager: [phone]  ? ?Chief Complaint: nausea and vomiting ? ?History of Present Illness:  ?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 History was obtained from the patient and from her chart. Patient is a reliable historian.  ?Patient was discharged on Monday after coming to the hospital for nausea and vomiting. She was doing fine at home but then had nausea again on Thursday. Nausea persisted for the next couple of days and she vomited on Saturday. The vomiting has been recurrent since Saturday and nothing patient has tried is helping her. She states she never wakes up with the nausea but it can occur before or after eating. Upon further questioning, patient does state eating exacerbates her nausea. She is denying any aggravating or alleviating factors and states she doesn't know if any medications help. Has been getting vomiting once or twice a month since August 2022 with 3 week interval.  ? ?The patient has been using Reglan TID since her discharge and stated she was not using the phenergan suppositories as it did not improve her nausea but just resulted in her "getting a bowel movement". She did not take her Reglan for a few weeks in 02/2021 as she was feeling well but has been taking it after her discharge.  ? ?She endorsed an appetite  until yesterday and stated she has not eating anything since 830 pm. She reports drinking a lot of water. She states her vomiting his non-bloody and has presence of undigested food.  ? ?On ROS, she is denying a headache, chest pain, shortness of breath, abdominal pain, back pain, joint or muscle pain, any hematochezia or melena, she is denying any dysuria. She reports urinary output twice daily with micturition lasting 1 minute each time. She has bowel movements daily to every other day and states they are hard to pass.   ? ?When asked patient about her PMH, she stated she was diagnosed with T2DM at age of 15 and managed with metformin but became IDDM during college. She struggled with obesity as a child. She remembers her A1c being around 14 in 2019 but unable to recall earlier values. She states she has better control of her diabetes after her pregnancy with her A1c in the single digits. She was diagnosed with gastroparesis in 2021 and retinopathy in 2020. She was diagnosed with HTN in 2019. She follows with Dr. Ileene Musa in [place] and was previously by Dr. Ernie Hew for one year and prior to that did not have insurance for PCP. She follows with GI but was last seen last year. She follows with Cardiology for her blood pressure and was last seen last year as well. She also follows  with nephrology and was last seen 6 days ago.  ? ?Review of Systems: Negative unless stated in the HPI.  ? ?ED Course: She arrived via EMS. She was given 4 mg Zofran via EMS. In the ED had vomiting and her pulse ox was 60 % and was placed on 2 L. She was given 2 L NS and Reglan 5 mg IV followed by 10 mg IV. Lab work, CXR, and EKG done and shown below. She did not require the supplemental oxygen later.  ? ? ?Past Medical History:  ?Diagnosis Date  ? Asthma   ? as a child, no problems as an adult, no inhaler  ? Cataract   ? NS OU  ? Chronic hypertension during pregnancy, antepartum 08/19/2017  ? Dehydration 01/28/2018  ? Depression during pregnancy,  antepartum 07/07/2017  ? 6/20: Short trial of zoloft previously, reports didn't help much but also didn't give it a chance Discussed r/b/a SSRIs in pregnancy, agrees to try Zoloft again, rx sent No SI/HI/red flags  ? Diabetes ([place])   ? TYPE I. A1C 7.5% 05/31/20  ? Diabetic retinopathy ([place]) 06/09/2017  ? 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  ? ESRD on peritoneal dialysis [facility])   ? HTN (hypertension)   ? Hypertensive retinopathy   ? OU  ? Hypokalemia 01/22/2018  ? Hypomagnesemia 01/28/2018  ? Intractable nausea and vomiting 01/22/2018  ? Intrauterine growth restriction (IUGR) affecting care of mother 12/22/2017  ? Morbid obesity ([place])   ? 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 Severe hyperemesis gravidarum 10/30/2017  ? Type I diabetes mellitus ([place]) 07/07/2017  ? Current Diabetic Medications:  Insulin  [x]  Aspirin 81 mg daily after 12 weeks (? A2/B GDM)  Required Referrals for A1GDM or A2GDM: [x]  Diabetes Education and Testing Supplies [x]  Nutrition Cousult  For A2/B GDM or higher classes of DM [x]  Diabetes Education and Testing Supplies [x]  Nutrition Counsult [x]  Fetal ECHO after 20 weeks  [x]  Eye exam for retina evaluation - severe retinopathy 7/19  Base  ? Ventricular septal defect (VSD) of fetus in singleton pregnancy, antepartum 09/30/2017  ? May go to newborn nursery per Dr. Lenard Simmer Echo prior to discharge  ? ? ?Meds: Verified with patient. Not taking Phenergan suppository.  ?Current Meds  ?Medication Sig  ? amLODipine (NORVASC) 10 MG tablet Take 1 tablet (10 mg total) by mouth daily. (Patient taking differently: Take 10 mg by mouth at bedtime.)  ? brimonidine (ALPHAGAN) 0.2 % ophthalmic solution Place 1 drop into the left eye 3 (three) times daily.  ? calcitRIOL (ROCALTROL) 0.5 MCG capsule Take 0.5 mcg by mouth daily.  ? carvedilol (COREG) 25 MG tablet Take 1 tablet (25 mg total) by mouth 2 (two) times daily.  ? Continuous Blood Gluc Receiver DEVI 1 Units by  Does not apply route 4 (four) times daily -  before meals and at bedtime. May substitute for cheapest monitor with insurance  ? Continuous Blood Gluc Sensor MISC 1 each by Does not apply route as directed. Use as directed every 14 days. May dispense FreeStyle [employer] or similar.  ? Continuous Glucose Monitor Sup MISC 1 Units by Does not apply route 4 (four) times daily -  before meals and at bedtime. May substitute for cheapest option with patient insurance  ? FLUoxetine (PROZAC) 20 MG capsule Take 20 mg by mouth daily.  ? hydrALAZINE (APRESOLINE) 50 MG tablet Take 1 tablet (50 mg total) by mouth 3 (three) times daily. (Patient taking  differently: Take 50 mg by mouth in the morning and at bedtime.)  ? insulin glargine (LANTUS SOLOSTAR) 100 UNIT/ML Solostar Pen Inject 20 Units into the skin every morning.  ? insulin lispro (HUMALOG KWIKPEN) 100 UNIT/ML KwikPen Inject 3 Units into the skin 3 (three) times daily with meals.  ? metoCLOPramide (REGLAN) 5 MG tablet Take 1 tablet (5 mg total) by mouth 3 (three) times daily before meals.  ? multivitamin (RENA-VIT) TABS tablet Take 1 tablet by mouth at bedtime.  ? sevelamer carbonate (RENVELA) 800 MG tablet Take 1 tablet (800 mg total) by mouth 3 (three) times daily with meals.  ?  ? ?Allergies: No known allergies.  ?Allergies as of 04/05/2021  ? (No Known Allergies)  ? ? ?Family History:  ?Family History  ?Problem Relation Age of Onset  ? Diabetes Mother   ? Aneurysm Mother 6  ? Seizures Mother   ? Diabetes Father   ? Cataracts Father   ? COPD Father   ? Heart attack Father   ? Heart disease Father   ? Healthy Sister   ? Healthy Daughter   ? Stroke Maternal Grandfather   ? Amblyopia Neg Hx   ? Blindness Neg Hx   ? Glaucoma Neg Hx   ? Macular degeneration Neg Hx   ? Retinal detachment Neg Hx   ? Strabismus Neg Hx   ? Retinitis pigmentosa Neg Hx   ? Colon cancer Neg Hx   ? Stomach cancer Neg Hx   ? Esophageal cancer Neg Hx   ? Pancreatic cancer Neg Hx   ? Liver  disease Neg Hx   ? ? ?Social History: Lives with boyfriend and son. No pets. Son is 110 year old. Disability since May. ADLs and iADLs. No cigarettes, no marijuana, no illicit substance use. Previous al

## 2021-04-05 NOTE — ED Provider Notes (Signed)
?[facility] ?Provider Note ? ? ?CSN: [id number] ?Arrival date & time: 04/05/21  1947 ? ?  ? ?History ? ?Chief Complaint  ?Patient presents with  ? Nausea  ? Emesis  ? ? ?Barbara Donovan is a 31 y.o. female with chief complaint of N/V and decreased appetite since D/C from [facility] ED 8 days ago.  Hx of ESRD on peritoneal dialysis, gastroparesis, diabetes mellitus type I, and anemia.  Usually has dialysis daily and has not missed any except for tonight.  Unable to keep down food and drink.  Was given Reglan outpatient to help with nausea, without any relief.  Feels weak and dehydrated.  Now endorsing shortness of breath.  Denies chest pain, abdominal pain, headache, hemoptysis, urinary symptoms, changes in bowel habits.  Denies fever or recent illness.  Normal blood glucose around 120, was told by EMS that it was around 250 before arrival.  Patient's voice appears hoarse. ? ?The history is provided by the patient and medical records.  ?Emesis ? ?  ? ?Home Medications ?Prior to Admission medications   ?Medication Sig Start Date End Date Taking? Authorizing Provider  ?amLODipine (NORVASC) 10 MG tablet Take 1 tablet (10 mg total) by mouth daily. 03/09/20   Mercy Riding, MD  ?brimonidine (ALPHAGAN) 0.2 % ophthalmic solution Place 1 drop into the left eye 3 (three) times daily. 03/09/21   [provider]  ?calcitRIOL (ROCALTROL) 0.5 MCG capsule Take 0.5 mcg by mouth daily. 12/02/20   [provider]  ?carvedilol (COREG) 25 MG tablet Take 1 tablet (25 mg total) by mouth 2 (two) times daily. 03/07/19   Imogene Burn, PA-C  ?Continuous Blood Gluc Receiver DEVI 1 Units by Does not apply route 4 (four) times daily -  before meals and at bedtime. May substitute for cheapest monitor with insurance 03/27/21   Scarlett Presto, MD  ?Continuous Blood Gluc Sensor MISC 1 each by Does not apply route as directed. Use as directed every 14 days. May dispense FreeStyle [employer]  or similar. ?Patient not taking: Reported on 03/21/2021 01/27/20   Antonieta Pert, MD  ?Continuous Glucose Monitor Sup MISC 1 Units by Does not apply route 4 (four) times daily -  before meals and at bedtime. May substitute for cheapest option with patient insurance 03/27/21 03/27/22  Scarlett Presto, MD  ?FLUoxetine (PROZAC) 20 MG capsule Take 20 mg by mouth daily. 03/16/21   [provider]  ?glucose blood (FREESTYLE TEST STRIPS) test strip Use as instructed ?Patient not taking: Reported on 03/21/2021 02/25/21   Terrilee Croak, MD  ?glucose blood (ONETOUCH VERIO) test strip 1 each by Other route 3 (three) times daily. And lancets 3/day ?Patient taking differently: 1 each by Other route 4 (four) times daily -  before meals and at bedtime. 11/28/20   Renato Shin, MD  ?hydrALAZINE (APRESOLINE) 50 MG tablet Take 1 tablet (50 mg total) by mouth 3 (three) times daily. ?Patient taking differently: Take 50 mg by mouth in the morning and at bedtime. 12/31/20   Domenic Polite, MD  ?insulin glargine (LANTUS SOLOSTAR) 100 UNIT/ML Solostar Pen Inject 20 Units into the skin every morning. 02/05/21   Bonnielee Haff, MD  ?insulin lispro (HUMALOG KWIKPEN) 100 UNIT/ML KwikPen Inject 3 Units into the skin 3 (three) times daily with meals. 11/28/20   Renato Shin, MD  ?metoCLOPramide (REGLAN) 5 MG tablet Take 1 tablet (5 mg total) by mouth 3 (three) times daily before meals. 02/23/21   Regenia Skeeter,  Nicki Reaper, MD  ?multivitamin (RENA-VIT) TABS tablet Take 1 tablet by mouth at bedtime. 03/08/20   Mercy Riding, MD  ?promethazine (PHENERGAN) 25 MG suppository Place 1 suppository (25 mg total) rectally every 6 (six) hours as needed for nausea or vomiting. ?Patient not taking: Reported on 03/25/2021 02/19/21   Margarita Mail, PA-C  ?sevelamer carbonate (RENVELA) 800 MG tablet Take 1 tablet (800 mg total) by mouth 3 (three) times daily with meals. 02/05/21   Bonnielee Haff, MD  ?   ? ?Allergies    ?Patient has no known allergies.   ? ?Review of Systems    ?Review of Systems  ?Respiratory:  Positive for shortness of breath.   ?Gastrointestinal:  Positive for nausea and vomiting.  ?Neurological:  Positive for weakness.  ? ?Physical Exam ?Updated Vital Signs ?BP (!) 160/90   Pulse 84   Temp 98.8 ?F (37.1 ?C) (Oral)   Resp 12   Ht 5\' 6"  (1.676 m)   Wt 66.2 kg   SpO2 100%   BMI 23.57 kg/m?  ?Physical Exam ?Vitals and nursing note reviewed.  ?Constitutional:   ?   General: She is not in acute distress. ?   Appearance: She is well-developed. She is ill-appearing. She is not diaphoretic.  ?HENT:  ?   Head: Normocephalic and atraumatic.  ?   Nose: Nose normal.  ?   Mouth/Throat:  ?   Mouth: Mucous membranes are dry.  ?   Pharynx: Oropharynx is clear.  ?Eyes:  ?   Conjunctiva/sclera: Conjunctivae normal.  ?Cardiovascular:  ?   Rate and Rhythm: Regular rhythm. Tachycardia present.  ?   Pulses: Normal pulses.  ?   Heart sounds: Normal heart sounds. No murmur heard. ?Pulmonary:  ?   Effort: Pulmonary effort is normal. No respiratory distress.  ?   Breath sounds: Normal breath sounds. No wheezing.  ?Abdominal:  ?   General: Bowel sounds are normal.  ?   Palpations: Abdomen is soft.  ?   Tenderness: There is no abdominal tenderness.  ?   Comments: Peritoneal catheter inspected and found to be without obvious signs of infection or dysfunction  ?Musculoskeletal:     ?   General: No swelling.  ?   Cervical back: Neck supple.  ?   Right lower leg: No edema.  ?   Left lower leg: No edema.  ?Skin: ?   General: Skin is warm and dry.  ?   Capillary Refill: Capillary refill takes less than 2 seconds.  ?   Coloration: Skin is not pale.  ?Neurological:  ?   General: No focal deficit present.  ?   Mental Status: She is alert and oriented to person, place, and time.  ?   Sensory: No sensory deficit.  ?   Motor: Weakness present.  ?Psychiatric:     ?   Mood and Affect: Mood normal.  ? ? ?ED Results / Procedures / Treatments   ?Labs ?(all labs ordered are listed, but only abnormal results  are displayed) ?Labs Reviewed  ?COMPREHENSIVE METABOLIC PANEL - Abnormal; Notable for the following components:  ?    Result Value  ? Sodium 134 (*)   ? Potassium 3.3 (*)   ? Chloride 97 (*)   ? CO2 17 (*)   ? Glucose, Bld 211 (*)   ? BUN 40 (*)   ? Creatinine, Ser 9.25 (*)   ? Albumin 3.4 (*)   ? GFR, Estimated 5 (*)   ? Anion gap 20 (*)   ?  All other components within normal limits  ?CBC WITH DIFFERENTIAL/PLATELET - Abnormal; Notable for the following components:  ? Platelets 439 (*)   ? All other components within normal limits  ?TROPONIN I (HIGH SENSITIVITY) - Abnormal; Notable for the following components:  ? Troponin I (High Sensitivity) 23 (*)   ? All other components within normal limits  ?LIPASE, BLOOD  ?URINALYSIS, ROUTINE W REFLEX MICROSCOPIC  ?MAGNESIUM  ?PHOSPHORUS  ?BETA-HYDROXYBUTYRIC ACID  ?CBG MONITORING, ED  ?TROPONIN I (HIGH SENSITIVITY)  ? ? ?EKG ?EKG Interpretation ? ?Date/Time:  Sunday April 05 2021 21:06:04 EDT ?Ventricular Rate:  95 ?PR Interval:  192 ?QRS Duration: 96 ?QT Interval:  367 ?QTC Calculation: 462 ?R Axis:   73 ?Text Interpretation: Sinus rhythm ST elev, probable normal early repol pattern When compared with ECG of EARLIER SAME DATE No significant change was found Confirmed by Delora Fuel ([id number]) on 04/05/2021 11:26:42 PM ? ?Radiology ?DG Chest Port 1 View ? ?Result Date: 04/05/2021 ?CLINICAL DATA:  Shortness of breath and hypoxia. EXAM: PORTABLE CHEST 1 VIEW COMPARISON:  02/04/2021 and prior radiographs FINDINGS: The cardiomediastinal silhouette is unremarkable. There is no evidence of focal airspace disease, pulmonary edema, suspicious pulmonary nodule/mass, pleural effusion, or pneumothorax. No acute bony abnormalities are identified. IMPRESSION: No active disease. Electronically Signed   By: Margarette Canada M.D.   On: 04/05/2021 20:43   ? ?Procedures ?Procedures  ? ? ?Medications Ordered in ED ?Medications  ?sodium chloride 0.9 % bolus 1,000 mL (1,000 mLs Intravenous New Bag/Given  04/05/21 2058)  ?sodium chloride 0.9 % bolus 1,000 mL (1,000 mLs Intravenous New Bag/Given 04/05/21 2217)  ?metoCLOPramide (REGLAN) injection 10 mg (5 mg Intravenous Given 04/05/21 2220)  ? ? ?ED Course/ Medical D

## 2021-04-05 NOTE — ED Triage Notes (Signed)
Pt BIB [facility] from home for N/V, states she hasn't been able to keep anything down since [facility] from [facility] 8 days ago. Peritoneal dialysis, haven't missed any days. 4mg  zofran given by EMS.  ?

## 2021-04-06 ENCOUNTER — Encounter: Payer: Medicare Other | Admitting: Internal Medicine

## 2021-04-06 DIAGNOSIS — E1022 Type 1 diabetes mellitus with diabetic chronic kidney disease: Secondary | ICD-10-CM

## 2021-04-06 DIAGNOSIS — F32A Depression, unspecified: Secondary | ICD-10-CM | POA: Diagnosis present

## 2021-04-06 DIAGNOSIS — N186 End stage renal disease: Secondary | ICD-10-CM

## 2021-04-06 DIAGNOSIS — R490 Dysphonia: Secondary | ICD-10-CM | POA: Diagnosis present

## 2021-04-06 DIAGNOSIS — Z823 Family history of stroke: Secondary | ICD-10-CM | POA: Diagnosis not present

## 2021-04-06 DIAGNOSIS — I120 Hypertensive chronic kidney disease with stage 5 chronic kidney disease or end stage renal disease: Secondary | ICD-10-CM | POA: Diagnosis present

## 2021-04-06 DIAGNOSIS — F419 Anxiety disorder, unspecified: Secondary | ICD-10-CM | POA: Diagnosis present

## 2021-04-06 DIAGNOSIS — Z79899 Other long term (current) drug therapy: Secondary | ICD-10-CM | POA: Diagnosis not present

## 2021-04-06 DIAGNOSIS — Z992 Dependence on renal dialysis: Secondary | ICD-10-CM

## 2021-04-06 DIAGNOSIS — Z833 Family history of diabetes mellitus: Secondary | ICD-10-CM | POA: Diagnosis not present

## 2021-04-06 DIAGNOSIS — F5089 Other specified eating disorder: Secondary | ICD-10-CM | POA: Diagnosis present

## 2021-04-06 DIAGNOSIS — E1010 Type 1 diabetes mellitus with ketoacidosis without coma: Secondary | ICD-10-CM

## 2021-04-06 DIAGNOSIS — R9431 Abnormal electrocardiogram [ECG] [EKG]: Secondary | ICD-10-CM | POA: Diagnosis present

## 2021-04-06 DIAGNOSIS — E860 Dehydration: Secondary | ICD-10-CM | POA: Diagnosis present

## 2021-04-06 DIAGNOSIS — Z8249 Family history of ischemic heart disease and other diseases of the circulatory system: Secondary | ICD-10-CM | POA: Diagnosis not present

## 2021-04-06 DIAGNOSIS — R112 Nausea with vomiting, unspecified: Secondary | ICD-10-CM | POA: Diagnosis not present

## 2021-04-06 DIAGNOSIS — E10319 Type 1 diabetes mellitus with unspecified diabetic retinopathy without macular edema: Secondary | ICD-10-CM | POA: Diagnosis present

## 2021-04-06 DIAGNOSIS — E1110 Type 2 diabetes mellitus with ketoacidosis without coma: Secondary | ICD-10-CM

## 2021-04-06 DIAGNOSIS — Z794 Long term (current) use of insulin: Secondary | ICD-10-CM | POA: Diagnosis not present

## 2021-04-06 DIAGNOSIS — Z825 Family history of asthma and other chronic lower respiratory diseases: Secondary | ICD-10-CM | POA: Diagnosis not present

## 2021-04-06 DIAGNOSIS — R1115 Cyclical vomiting syndrome unrelated to migraine: Secondary | ICD-10-CM | POA: Diagnosis present

## 2021-04-06 LAB — RENAL FUNCTION PANEL
Albumin: 3.2 g/dL — ABNORMAL LOW (ref 3.5–5.0)
Anion gap: 11 (ref 5–15)
BUN: 44 mg/dL — ABNORMAL HIGH (ref 6–20)
CO2: 22 mmol/L (ref 22–32)
Calcium: 9.4 mg/dL (ref 8.9–10.3)
Chloride: 99 mmol/L (ref 98–111)
Creatinine, Ser: 10.01 mg/dL — ABNORMAL HIGH (ref 0.44–1.00)
GFR, Estimated: 5 mL/min — ABNORMAL LOW (ref >60)
Glucose, Bld: 181 mg/dL — ABNORMAL HIGH (ref 70–99)
Phosphorus: 5.2 mg/dL — ABNORMAL HIGH (ref 2.5–4.6)
Potassium: 4.6 mmol/L (ref 3.5–5.1)
Sodium: 132 mmol/L — ABNORMAL LOW (ref 135–145)

## 2021-04-06 LAB — CBG MONITORING, ED
Glucose-Capillary: 176 mg/dL — ABNORMAL HIGH (ref 70–99)
Glucose-Capillary: 151 mg/dL — ABNORMAL HIGH (ref 70–99)
Glucose-Capillary: 159 mg/dL — ABNORMAL HIGH (ref 70–99)
Glucose-Capillary: 138 mg/dL — ABNORMAL HIGH (ref 70–99)
Glucose-Capillary: 127 mg/dL — ABNORMAL HIGH (ref 70–99)
Glucose-Capillary: 119 mg/dL — ABNORMAL HIGH (ref 70–99)
Glucose-Capillary: 140 mg/dL — ABNORMAL HIGH (ref 70–99)
Glucose-Capillary: 167 mg/dL — ABNORMAL HIGH (ref 70–99)
Glucose-Capillary: 158 mg/dL — ABNORMAL HIGH (ref 70–99)
Glucose-Capillary: 172 mg/dL — ABNORMAL HIGH (ref 70–99)

## 2021-04-06 LAB — BASIC METABOLIC PANEL
Anion gap: 17 — ABNORMAL HIGH (ref 5–15)
Anion gap: 14 (ref 5–15)
Anion gap: 16 — ABNORMAL HIGH (ref 5–15)
BUN: 45 mg/dL — ABNORMAL HIGH (ref 6–20)
BUN: 45 mg/dL — ABNORMAL HIGH (ref 6–20)
BUN: 42 mg/dL — ABNORMAL HIGH (ref 6–20)
CO2: 20 mmol/L — ABNORMAL LOW (ref 22–32)
CO2: 19 mmol/L — ABNORMAL LOW (ref 22–32)
CO2: 17 mmol/L — ABNORMAL LOW (ref 22–32)
Calcium: 9.7 mg/dL (ref 8.9–10.3)
Calcium: 8.8 mg/dL — ABNORMAL LOW (ref 8.9–10.3)
Calcium: 9.3 mg/dL (ref 8.9–10.3)
Chloride: 94 mmol/L — ABNORMAL LOW (ref 98–111)
Chloride: 99 mmol/L (ref 98–111)
Chloride: 101 mmol/L (ref 98–111)
Creatinine, Ser: 9.51 mg/dL — ABNORMAL HIGH (ref 0.44–1.00)
Creatinine, Ser: 9.28 mg/dL — ABNORMAL HIGH (ref 0.44–1.00)
Creatinine, Ser: 9.49 mg/dL — ABNORMAL HIGH (ref 0.44–1.00)
GFR, Estimated: 5 mL/min — ABNORMAL LOW (ref >60)
GFR, Estimated: 5 mL/min — ABNORMAL LOW (ref >60)
GFR, Estimated: 5 mL/min — ABNORMAL LOW (ref >60)
Glucose, Bld: 189 mg/dL — ABNORMAL HIGH (ref 70–99)
Glucose, Bld: 140 mg/dL — ABNORMAL HIGH (ref 70–99)
Glucose, Bld: 141 mg/dL — ABNORMAL HIGH (ref 70–99)
Potassium: 3.2 mmol/L — ABNORMAL LOW (ref 3.5–5.1)
Potassium: 4.1 mmol/L (ref 3.5–5.1)
Potassium: 4.7 mmol/L (ref 3.5–5.1)
Sodium: 131 mmol/L — ABNORMAL LOW (ref 135–145)
Sodium: 132 mmol/L — ABNORMAL LOW (ref 135–145)
Sodium: 134 mmol/L — ABNORMAL LOW (ref 135–145)

## 2021-04-06 LAB — URINALYSIS, ROUTINE W REFLEX MICROSCOPIC
Bilirubin Urine: NEGATIVE
Glucose, UA: >=500 mg/dL — AB
Hgb urine dipstick: NEGATIVE
Ketones, ur: 5 mg/dL — AB
Leukocytes,Ua: NEGATIVE
Nitrite: NEGATIVE
Protein, ur: >=300 mg/dL — AB
Specific Gravity, Urine: 1.009 (ref 1.005–1.030)
pH: 7 (ref 5.0–8.0)

## 2021-04-06 LAB — GLUCOSE, CAPILLARY: Glucose-Capillary: 197 mg/dL — ABNORMAL HIGH (ref 70–99)

## 2021-04-06 LAB — BETA-HYDROXYBUTYRIC ACID
Beta-Hydroxybutyric Acid: 2.09 mmol/L — ABNORMAL HIGH (ref 0.05–0.27)
Beta-Hydroxybutyric Acid: 1.57 mmol/L — ABNORMAL HIGH (ref 0.05–0.27)
Beta-Hydroxybutyric Acid: 0.78 mmol/L — ABNORMAL HIGH (ref 0.05–0.27)
Beta-Hydroxybutyric Acid: 0.2 mmol/L (ref 0.05–0.27)

## 2021-04-06 LAB — MAGNESIUM: Magnesium: 2.3 mg/dL (ref 1.7–2.4)

## 2021-04-06 LAB — TROPONIN I (HIGH SENSITIVITY): Troponin I (High Sensitivity): 23 ng/L — ABNORMAL HIGH (ref <18)

## 2021-04-06 LAB — PHOSPHORUS: Phosphorus: 4.9 mg/dL — ABNORMAL HIGH (ref 2.5–4.6)

## 2021-04-06 MED ORDER — CARVEDILOL 25 MG PO TABS
25.0000 mg | ORAL_TABLET | Freq: Two times a day (BID) | ORAL | Status: DC
  Administered 2021-04-06 – 2021-04-07 (×4): 25 mg via ORAL
  Filled 2021-04-06 (×2): qty 1
  Filled 2021-04-06 (×2): qty 2

## 2021-04-06 MED ORDER — DEXTROSE 50 % IV SOLN: 0.0000 mL | INTRAVENOUS | Status: DC | PRN

## 2021-04-06 MED ORDER — POTASSIUM CHLORIDE 20 MEQ PO PACK
60.0000 meq | PACK | Freq: Once | ORAL | Status: DC
  Filled 2021-04-06: qty 3

## 2021-04-06 MED ORDER — DEXTROSE IN LACTATED RINGERS 5 % IV SOLN: INTRAVENOUS | Status: DC

## 2021-04-06 MED ORDER — INSULIN GLARGINE-YFGN 100 UNIT/ML ~~LOC~~ SOLN
20.0000 [IU] | Freq: Once | SUBCUTANEOUS | Status: DC
Start: 2021-04-06 — End: 2021-04-06
  Filled 2021-04-06: qty 0.2

## 2021-04-06 MED ORDER — DELFLEX-LC/2.5% DEXTROSE 394 MOSM/L IP SOLN: INTRAPERITONEAL | Status: DC

## 2021-04-06 MED ORDER — POTASSIUM CHLORIDE 10 MEQ/100ML IV SOLN
10.0000 meq | Freq: Once | INTRAVENOUS | Status: AC
  Administered 2021-04-06: 10 meq via INTRAVENOUS
  Filled 2021-04-06: qty 100

## 2021-04-06 MED ORDER — INSULIN REGULAR(HUMAN) IN NACL 100-0.9 UT/100ML-% IV SOLN
INTRAVENOUS | Status: DC
  Administered 2021-04-06: 1.4 [IU]/h via INTRAVENOUS
  Filled 2021-04-06: qty 100

## 2021-04-06 MED ORDER — METOCLOPRAMIDE HCL 10 MG PO TABS: 5.0000 mg | ORAL_TABLET | Freq: Four times a day (QID) | ORAL | Status: DC | PRN

## 2021-04-06 MED ORDER — POTASSIUM CHLORIDE CRYS ER 20 MEQ PO TBCR
40.0000 meq | EXTENDED_RELEASE_TABLET | Freq: Once | ORAL | Status: AC
  Administered 2021-04-06: 40 meq via ORAL
  Filled 2021-04-06: qty 2

## 2021-04-06 MED ORDER — INSULIN GLARGINE-YFGN 100 UNIT/ML ~~LOC~~ SOLN
10.0000 [IU] | Freq: Every day | SUBCUTANEOUS | Status: DC
  Administered 2021-04-06: 10 [IU] via SUBCUTANEOUS
  Filled 2021-04-06 (×2): qty 0.1

## 2021-04-06 MED ORDER — DELFLEX-LC/1.5% DEXTROSE 344 MOSM/L IP SOLN: INTRAPERITONEAL | Status: DC

## 2021-04-06 MED ORDER — GENTAMICIN SULFATE 0.1 % EX CREA
1.0000 "application " | TOPICAL_CREAM | Freq: Every day | CUTANEOUS | Status: DC
  Filled 2021-04-06: qty 15

## 2021-04-06 MED ORDER — NEPRO/CARBSTEADY PO LIQD
237.0000 mL | Freq: Two times a day (BID) | ORAL | Status: DC
Start: 2021-04-07 — End: 2021-04-07
  Administered 2021-04-07 (×2): 237 mL via ORAL

## 2021-04-06 MED ORDER — LACTATED RINGERS IV SOLN: INTRAVENOUS | Status: DC

## 2021-04-06 MED ORDER — METOCLOPRAMIDE HCL 5 MG PO TABS
5.0000 mg | ORAL_TABLET | Freq: Three times a day (TID) | ORAL | Status: DC
Start: 2021-04-06 — End: 2021-04-07
  Administered 2021-04-06 – 2021-04-07 (×3): 5 mg via ORAL
  Filled 2021-04-06 (×3): qty 1

## 2021-04-06 MED ORDER — PROCHLORPERAZINE EDISYLATE 10 MG/2ML IJ SOLN: 10.0000 mg | Freq: Four times a day (QID) | INTRAMUSCULAR | Status: DC | PRN

## 2021-04-06 MED ORDER — INSULIN ASPART 100 UNIT/ML IJ SOLN
0.0000 [IU] | Freq: Three times a day (TID) | INTRAMUSCULAR | Status: DC
  Administered 2021-04-06 – 2021-04-07 (×3): 1 [IU] via SUBCUTANEOUS
  Administered 2021-04-07: 3 [IU] via SUBCUTANEOUS

## 2021-04-06 MED ORDER — POTASSIUM CHLORIDE 10 MEQ/100ML IV SOLN: 10.0000 meq | INTRAVENOUS | Status: DC

## 2021-04-06 NOTE — ED Notes (Signed)
[employer] suggested transitioning off insulin drip, MD Humphrey Rolls notified.  ?

## 2021-04-06 NOTE — Consult Note (Signed)
Renal Service ?Consult Note ?[facility] ? ?[name] ?04/06/2021 ?Sol Blazing, MD ?Requesting Physician: Dr. Cain Sieve ? ?Reason for Consult: ESRD pt w/ DM1 and DKA ?HPI: The patient is a 31 y.o. 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 She was dx'd w/ DKA and admitted and started on IV insulin.  We are asked to see for ESRD.   ? ?Pt is on PD, started last fall 2022. She had started HD in May 2002.  Pt states she is feeling better.  No recent PD issues.  ? ?ROS - denies CP, no joint pain, no HA, no blurry vision, no rash, no diarrhea, no nausea/ vomiting ? ? ?Past Medical History  ?Past Medical History:  ?Diagnosis Date  ? Asthma   ? as a child, no problems as an adult, no inhaler  ? Cataract   ? NS OU  ? Chronic hypertension during pregnancy, antepartum 08/19/2017  ? Dehydration 01/28/2018  ? Depression during pregnancy, antepartum 07/07/2017  ? 6/20: Short trial of zoloft previously, reports didn't help much but also didn't give it a chance Discussed r/b/a SSRIs in pregnancy, agrees to try Zoloft again, rx sent No SI/HI/red flags  ? Diabetes ([place])   ? TYPE I. A1C 7.5% 05/31/20  ? Diabetic retinopathy ([place]) 06/09/2017  ? 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  ? ESRD on peritoneal dialysis [facility])   ? HTN (hypertension)   ? Hypertensive retinopathy   ? OU  ? Hypokalemia 01/22/2018  ? Hypomagnesemia 01/28/2018  ? Intractable nausea and vomiting 01/22/2018  ? Intrauterine growth restriction (IUGR) affecting care of mother 12/22/2017  ? Morbid obesity ([place])   ? Nephropathy, diabetic ([place]) 12/29/2017  ? Severe hyperemesis gravidarum 10/30/2017  ? Type I diabetes mellitus ([place]) 07/07/2017  ? Current Diabetic Medications:  Insulin  [x]  Aspirin 81 mg daily after 12 weeks (? A2/B GDM)  Required Referrals for A1GDM or A2GDM: [x]  Diabetes  Education and Testing Supplies [x]  Nutrition Cousult  For A2/B GDM or higher classes of DM [x]  Diabetes Education and Testing Supplies [x]  Nutrition Counsult [x]  Fetal ECHO after 20 weeks  [x]  Eye exam for retina evaluation - severe retinopathy 7/19  Base  ? Ventricular septal defect (VSD) of fetus in singleton pregnancy, antepartum 09/30/2017  ? May go to newborn nursery per Dr. Lenard Simmer Echo prior to discharge  ? ?Past Surgical History  ?Past Surgical History:  ?Procedure Laterality Date  ? 25 GAUGE PARS PLANA VITRECTOMY WITH 20 GAUGE MVR PORT FOR MACULAR HOLE Left 07/20/2018  ? Procedure: [place] VITRECTOMY LEFT EYE;  Surgeon: Bernarda Caffey, MD;  Location: [place];  Service: Ophthalmology;  Laterality: Left;  ? CAPD INSERTION N/A 09/10/2020  ? Procedure: LAPAROSCOPIC INSERTION CONTINUOUS AMBULATORY PERITONEAL DIALYSIS  (CAPD) CATHETER;  Surgeon: Serafina Mitchell, MD;  Location: [place];  Service: Vascular;  Laterality: N/A;  ? CESAREAN SECTION N/A 12/26/2017  ? Procedure: CESAREAN SECTION;  Surgeon: Osborne Oman, MD;  Location: [place];  Service: Obstetrics;  Laterality: N/A;  ? EYE EXAMINATION UNDER ANESTHESIA Right 07/20/2018  ? Procedure: Eye Exam Under Anesthesia RIGHT EYE;  Surgeon: Bernarda Caffey, MD;  Location: [place];  Service: Ophthalmology;  Laterality: Right;  ? EYE SURGERY Left 07/2018  ? GAS INSERTION Left 07/19/2019  ? Procedure: INSERTION OF GAS;  Surgeon: Bernarda Caffey,  MD;  Location: [place];  Service: Ophthalmology;  Laterality: Left;  SF6  ? INJECTION OF SILICONE OIL Left 01/24/107  ? Procedure: Injection Of Silicone Oil LEFT EYE;  Surgeon: Bernarda Caffey, MD;  Location: [place];  Service: Ophthalmology;  Laterality: Left;  ? IR FLUORO GUIDE CV LINE RIGHT  06/02/2020  ? IR US GUIDE VASC ACCESS RIGHT  06/02/2020  ? LASER PHOTO ABLATION Right 07/20/2018  ? Procedure: Laser Photo Ablation RIGHT EYE;  Surgeon: Bernarda Caffey, MD;  Location: [place];  Service: Ophthalmology;  Laterality: Right;  ?  MEMBRANE PEEL Left 07/20/2018  ? Procedure: Membrane Peel LEFT EYE;  Surgeon: Bernarda Caffey, MD;  Location: [place];  Service: Ophthalmology;  Laterality: Left;  ? MEMBRANE PEEL Left 07/19/2019  ? Procedure: MEMBRANE PEEL;  Surgeon: Bernarda Caffey, MD;  Location: [place];  Service: Ophthalmology;  Laterality: Left;  ? MITOMYCIN C APPLICATION Bilateral 03/20/3555  ? Procedure: Avastin Application;  Surgeon: Bernarda Caffey, MD;  Location: [place];  Service: Ophthalmology;  Laterality: Bilateral;  ? PHOTOCOAGULATION WITH LASER Left 07/20/2018  ? Procedure: Photocoagulation With Laser LEFT EYE;  Surgeon: Bernarda Caffey, MD;  Location: [place];  Service: Ophthalmology;  Laterality: Left;  ? PHOTOCOAGULATION WITH LASER Left 07/19/2019  ? Procedure: PHOTOCOAGULATION WITH LASER;  Surgeon: Bernarda Caffey, MD;  Location: [place];  Service: Ophthalmology;  Laterality: Left;  ? RETINAL DETACHMENT SURGERY Left 07/20/2018  ? Dr. Bernarda Caffey  ? SILICON OIL REMOVAL Left 07/19/2019  ? Procedure: 25g PARS PLANA VITRECTOMY WITH SILICON OIL REMOVAL;  Surgeon: Bernarda Caffey, MD;  Location: [place];  Service: Ophthalmology;  Laterality: Left;  ? WISDOM TOOTH EXTRACTION    ? ?Family History  ?Family History  ?Problem Relation Age of Onset  ? Diabetes Mother   ? Aneurysm Mother 54  ? Seizures Mother   ? Diabetes Father   ? Cataracts Father   ? COPD Father   ? Heart attack Father   ? Heart disease Father   ? Healthy Sister   ? Healthy Daughter   ? Stroke Maternal Grandfather   ? Amblyopia Neg Hx   ? Blindness Neg Hx   ? Glaucoma Neg Hx   ? Macular degeneration Neg Hx   ? Retinal detachment Neg Hx   ? Strabismus Neg Hx   ? Retinitis pigmentosa Neg Hx   ? Colon cancer Neg Hx   ? Stomach cancer Neg Hx   ? Esophageal cancer Neg Hx   ? Pancreatic cancer Neg Hx   ? Liver disease Neg Hx   ? ?Social History  reports that she has never smoked. She has never used smokeless tobacco. She reports that she does not currently use alcohol. She reports that she does not use  drugs. ?Allergies No Known Allergies ?Home medications ?Prior to Admission medications   ?Medication Sig Start Date End Date Taking? Authorizing Provider  ?amLODipine (NORVASC) 10 MG tablet Take 1 tablet (10 mg total) by mouth daily. ?Patient taking differently: Take 10 mg by mouth at bedtime. 03/09/20  Yes Mercy Riding, MD  ?brimonidine (ALPHAGAN) 0.2 % ophthalmic solution Place 1 drop into the left eye 3 (three) times daily. 03/09/21  Yes [provider]  ?calcitRIOL (ROCALTROL) 0.5 MCG capsule Take 0.5 mcg by mouth daily. 12/02/20  Yes [provider]  ?carvedilol (COREG) 25 MG tablet Take 1 tablet (25 mg total) by mouth 2 (two) times daily. 03/07/19  Yes Imogene Burn, PA-C  ?Continuous Blood Gluc Receiver DEVI 1  Units by Does not apply route 4 (four) times daily -  before meals and at bedtime. May substitute for cheapest monitor with insurance 03/27/21  Yes Demaio, Alexa, MD  ?Continuous Blood Gluc Sensor MISC 1 each by Does not apply route as directed. Use as directed every 14 days. May dispense FreeStyle [employer] or similar. 01/27/20  Yes Antonieta Pert, MD  ?Continuous Glucose Monitor Sup MISC 1 Units by Does not apply route 4 (four) times daily -  before meals and at bedtime. May substitute for cheapest option with patient insurance 03/27/21 03/27/22 Yes Demaio, Alexa, MD  ?FLUoxetine (PROZAC) 20 MG capsule Take 20 mg by mouth daily. 03/16/21  Yes [provider]  ?hydrALAZINE (APRESOLINE) 50 MG tablet Take 1 tablet (50 mg total) by mouth 3 (three) times daily. ?Patient taking differently: Take 50 mg by mouth in the morning and at bedtime. 12/31/20  Yes Domenic Polite, MD  ?insulin glargine (LANTUS SOLOSTAR) 100 UNIT/ML Solostar Pen Inject 20 Units into the skin every morning. 02/05/21  Yes Bonnielee Haff, MD  ?insulin lispro (HUMALOG KWIKPEN) 100 UNIT/ML KwikPen Inject 3 Units into the skin 3 (three) times daily with meals. 11/28/20  Yes Renato Shin, MD  ?metoCLOPramide  (REGLAN) 5 MG tablet Take 1 tablet (5 mg total) by mouth 3 (three) times daily before meals. 02/23/21  Yes Sherwood Gambler, MD  ?multivitamin (RENA-VIT) TABS tablet Take 1 tablet by mouth at bedtime. 03/08/20  Yes Willeen Niece

## 2021-04-06 NOTE — ED Notes (Signed)
This RN and phlebotomy was unsuccessful at 2nd blood draw for CBC. Per lab the CBC hemolyzed, MD notified.   ?

## 2021-04-06 NOTE — ED Notes (Signed)
Admitting team at [facility], [place]. Semglee requested from pharmacy. Will transition. PBT to draw labs.  ?

## 2021-04-06 NOTE — ED Notes (Signed)
EndoTool- insulin 1.6 ?Next CBG check 0449 ?

## 2021-04-06 NOTE — ED Notes (Signed)
Hemodialysis contacted via phone.  Pt is on list. Will initiate CCPD when moves into inpt room upstairs. ?

## 2021-04-06 NOTE — ED Notes (Signed)
EndoTool- insulin 1.4u/hr ?Next CBG check 0349 ?

## 2021-04-06 NOTE — Progress Notes (Signed)
? ? ?HD#0 ?Subjective:  ?Overnight Events: NAEO ? ?Has been dealing with ongoing nausea. Has decreased appetite a couple of days ago. Vomiting started yesterday, this episode is very similar to when she came in last time. However therapy has been helping and she did not induce this episode of vomiting. Doing better mentally. Still denies nay belly pain. ? ?Objective:  ?Vital signs in last 24 hours: ?Vitals:  ? 04/06/21 0830 04/06/21 0845 04/06/21 0900 04/06/21 0915  ?BP: 133/85 129/81 (!) 154/94 (!) 139/92  ?Pulse: 80 88 84 89  ?Resp: (!) 9     ?Temp:      ?TempSrc:      ?SpO2: 99% 100% 98% 100%  ?Weight:      ?Height:      ? ?Supplemental O2:  ?SpO2: 100 % ?O2 Flow Rate (L/min): 2 L/min (d/t pt sating 60% RA) ? ? ?Physical Exam:  ?Constitutional: young woman resting in bed in no acute distress ?HEENT: normocephalic atraumatic, mucous membranes moist, conjunctiva non-erythematous ?Cardiovascular: regular rate and rhythm, no m/r/g ?Pulmonary/Chest: normal work of breathing on room air, lungs clear to auscultation bilaterally  ?Abdominal: soft, non-tender, non-distended, PD catheter in place ?MSK: normal bulk and tone ?Neurological: alert & oriented x 3, answering questions appropriately ?Skin: warm and dry ?Psych: normal affect ? ?Filed Weights  ? 04/05/21 2224  ?Weight: 66.2 kg  ? ? ? ?Intake/Output Summary (Last 24 hours) at 04/06/2021 0941 ?Last data filed at 04/06/2021 9373 ?Gross per 24 hour  ?Intake 2920 ml  ?Output 625 ml  ?Net 2295 ml  ? ?Net IO Since Admission: 2,295 mL [04/06/21 0941] ? ?Pertinent Labs: ?CBC Latest Ref Rng & Units 04/05/2021 03/30/2021 03/29/2021  ?WBC 4.0 - 10.5 K/uL 8.4 6.2 -  ?Hemoglobin 12.0 - 15.0 g/dL 12.9 11.2(L) 13.6  ?Hematocrit 36.0 - 46.0 % 38.7 33.7(L) 40.0  ?Platelets 150 - 400 K/uL 439(H) 361 -  ? ? ?CMP Latest Ref Rng & Units 04/06/2021 04/06/2021 04/06/2021  ?Glucose 70 - 99 mg/dL 141(H) 140(H) 189(H)  ?BUN 6 - 20 mg/dL 42(H) 45(H) 45(H)  ?Creatinine 0.44 - 1.00 mg/dL 9.49(H)  9.28(H) 9.51(H)  ?Sodium 135 - 145 mmol/L 134(L) 132(L) 131(L)  ?Potassium 3.5 - 5.1 mmol/L 4.7 4.1 3.2(L)  ?Chloride 98 - 111 mmol/L 101 99 94(L)  ?CO2 22 - 32 mmol/L 17(L) 19(L) 20(L)  ?Calcium 8.9 - 10.3 mg/dL 9.3 8.8(L) 9.7  ?Total Protein 6.5 - 8.1 g/dL - - -  ?Total Bilirubin 0.3 - 1.2 mg/dL - - -  ?Alkaline Phos 38 - 126 U/L - - -  ?AST 15 - 41 U/L - - -  ?ALT 0 - 44 U/L - - -  ? ? ?Imaging: ?DG Chest Port 1 View ? ?Result Date: 04/05/2021 ?CLINICAL DATA:  Shortness of breath and hypoxia. EXAM: PORTABLE CHEST 1 VIEW COMPARISON:  02/04/2021 and prior radiographs FINDINGS: The cardiomediastinal silhouette is unremarkable. There is no evidence of focal airspace disease, pulmonary edema, suspicious pulmonary nodule/mass, pleural effusion, or pneumothorax. No acute bony abnormalities are identified. IMPRESSION: No active disease. Electronically Signed   By: Margarette Canada M.D.   On: 04/05/2021 20:43   ? ?Assessment/Plan:  ? ?Principal Problem: ?  Intractable nausea and vomiting ?Active Problems: ?  Type 1 diabetes mellitus with hyperglycemia ([place]) ?  ESRD (end stage renal disease) on PD ?  Increased anion gap metabolic acidosis ?  Gastroparesis ?  DKA (diabetic ketoacidosis) ([place]) ? ? ?Patient Summary: ?Barbara Donovan is a 31 y.o. with  past medical history of IDDM complicated by gastroparesis and retinopathy, with previous complication of DKA, HTN, hyperemesis gravidarum, with repeat admissions for nausea and vomiting who is presenting with nausea and vomiting for 2 days and found to have elevated glucose, and elevated BHB on lab work suggesting DKA. ?  ?#Intractable Nausea and Vomiting ?Similarly to prior presentations patient has a quick response/turn around after receiving antiemetics and is feeling hungry again. Given patient's rapid improvement after IV formulations it is possible that patient may need IM/SubQ versions to take home if she is unable to take in any PO intake. This time she also mentioned losing  her appetite before getting nauseous. Will clarify with patient if she only takes the reglan with meals or if she was taking it despite not eating. She denies any self induced vomiting and says that continuing therapy and developing coping strategies has helped keep her out of the hospital up until this point. Do not think GI consult is needed at this time and a gastric emptying study would be inconclusive in the setting of reglan. Will Ctm and explore possible home IM options for antiemetics ?- reglan 5 PRN 4x daily ?- consider ativan or erythromycin ?  ?#IDDM ?#DKA; improving ?Anion gap has improved and patient has been transitioned off the endo tool. Will start with 10 units of semglee and sliding scale with meals. Suspect in part this elevated anion gap is also related to starvation ketosis. ?- SSI,  ?- BMP Q8 ?  ?#ESRD on PD ?EDP consulted nephrology for PD initiation. Will make sure she is set up for this tonight. ?-f/u consult ?-Continue home Calcitriol 0.5 mg daily, and Sevelamer 800 mg TID.  ?  ?#Anxiety and Depression ?Next therapy appointment is on Friday. ?-Continue home Prozac 20 mg daily. ?  ?DVT prophx: Heparin 5000 TID ?Diet: carb modified ?Code:Full ?  ?Prior to Admission Living Arrangement: Home ?Anticipated Discharge Location: Home ?Barriers to Discharge:Medical workup ?  ?Dispo: Admit patient to Observation with expected length of stay less than 2 midnights. ?  ? ?Scarlett Presto, MD ?Internal Medicine Resident PGY-1 ?Pager [phone] ?Please contact the on call pager after 5 pm and on weekends at [phone]. ? ?

## 2021-04-06 NOTE — Plan of Care (Signed)

## 2021-04-06 NOTE — Progress Notes (Signed)
Patient received from ED via stretcher.  Patient is able to get up and walk to bed, gait and balance stable.  Assisted in bed in position of comfort.  Denies nausea and pain at this time.  PD catheter noted in mid abdomen, open to air, clean, dry and intact.  Oriented to room and unit routine.  Call bell within reach.  Needs addressed.   ?

## 2021-04-06 NOTE — Hospital Course (Addendum)
Patient reports doing well this morning. She denies nausea and vomiting.  ? ?No acute concerns, and patient feels ready for discharge. We discuss a contingency plan for clinic visits to avoid multiple hospitalizations, and she is agreeable and appreciative. ?

## 2021-04-07 ENCOUNTER — Other Ambulatory Visit (HOSPITAL_COMMUNITY): Payer: Self-pay

## 2021-04-07 DIAGNOSIS — N186 End stage renal disease: Secondary | ICD-10-CM | POA: Diagnosis not present

## 2021-04-07 DIAGNOSIS — E1010 Type 1 diabetes mellitus with ketoacidosis without coma: Secondary | ICD-10-CM | POA: Diagnosis not present

## 2021-04-07 DIAGNOSIS — E1022 Type 1 diabetes mellitus with diabetic chronic kidney disease: Secondary | ICD-10-CM | POA: Diagnosis not present

## 2021-04-07 DIAGNOSIS — R112 Nausea with vomiting, unspecified: Secondary | ICD-10-CM | POA: Diagnosis not present

## 2021-04-07 LAB — RENAL FUNCTION PANEL
Albumin: 2.8 g/dL — ABNORMAL LOW (ref 3.5–5.0)
Anion gap: 11 (ref 5–15)
BUN: 43 mg/dL — ABNORMAL HIGH (ref 6–20)
CO2: 22 mmol/L (ref 22–32)
Calcium: 9.2 mg/dL (ref 8.9–10.3)
Chloride: 100 mmol/L (ref 98–111)
Creatinine, Ser: 9.96 mg/dL — ABNORMAL HIGH (ref 0.44–1.00)
GFR, Estimated: 5 mL/min — ABNORMAL LOW (ref >60)
Glucose, Bld: 255 mg/dL — ABNORMAL HIGH (ref 70–99)
Phosphorus: 5.2 mg/dL — ABNORMAL HIGH (ref 2.5–4.6)
Potassium: 4.2 mmol/L (ref 3.5–5.1)
Sodium: 133 mmol/L — ABNORMAL LOW (ref 135–145)

## 2021-04-07 LAB — GLUCOSE, CAPILLARY
Glucose-Capillary: 195 mg/dL — ABNORMAL HIGH (ref 70–99)
Glucose-Capillary: 235 mg/dL — ABNORMAL HIGH (ref 70–99)
Glucose-Capillary: 262 mg/dL — ABNORMAL HIGH (ref 70–99)

## 2021-04-07 MED ORDER — ONDANSETRON 4 MG PO TBDP: 4.0000 mg | ORAL_TABLET | Freq: Four times a day (QID) | ORAL | Status: DC | PRN

## 2021-04-07 MED ORDER — ONDANSETRON 4 MG PO TBDP
4.0000 mg | ORAL_TABLET | Freq: Four times a day (QID) | ORAL | 0 refills | Status: DC | PRN
  Filled 2021-04-07: qty 20, 5d supply, fill #0

## 2021-04-07 MED ORDER — INSULIN GLARGINE-YFGN 100 UNIT/ML ~~LOC~~ SOLN
16.0000 [IU] | Freq: Every day | SUBCUTANEOUS | Status: DC
  Administered 2021-04-07: 16 [IU] via SUBCUTANEOUS
  Filled 2021-04-07: qty 0.16

## 2021-04-07 NOTE — Discharge Summary (Signed)
? ?Name: Barbara Donovan ?MRN: [id number] ?DOB: 1990/11/09 30 y.o. ?PCP: Scarlett Presto, MD ? ?Date of Admission: 04/05/2021  7:47 PM ?Date of Discharge:   04/07/21 ?Attending Physician: Lottie Mussel, MD ? ?Discharge Diagnosis: ?1. Intractable Nausea and Vomiting ?2. IDDM ?3. DKA; improving ?4. ESRD on PD ?5. Anxiety and Depression ? ?Discharge Medications: ?Allergies as of 04/07/2021   ?No Known Allergies ?  ? ?  ?Medication List  ?  ? ?TAKE these medications   ? ?amLODipine 10 MG tablet ?Commonly known as: NORVASC ?Take 1 tablet (10 mg total) by mouth daily. ?What changed: when to take this ?  ?brimonidine 0.2 % ophthalmic solution ?Commonly known as: ALPHAGAN ?Place 1 drop into the left eye 3 (three) times daily. ?  ?calcitRIOL 0.5 MCG capsule ?Commonly known as: ROCALTROL ?Take 0.5 mcg by mouth daily. ?  ?carvedilol 25 MG tablet ?Commonly known as: COREG ?Take 1 tablet (25 mg total) by mouth 2 (two) times daily. ?  ?Continuous Blood Gluc Receiver Devi ?1 Units by Does not apply route 4 (four) times daily -  before meals and at bedtime. May substitute for cheapest monitor with insurance ?  ?Continuous Blood Gluc Sensor Misc ?1 each by Does not apply route as directed. Use as directed every 14 days. May dispense FreeStyle [employer] or similar. ?  ?Continuous Glucose Monitor Sup Misc ?1 Units by Does not apply route 4 (four) times daily -  before meals and at bedtime. May substitute for cheapest option with patient insurance ?  ?FLUoxetine 20 MG capsule ?Commonly known as: PROZAC ?Take 20 mg by mouth daily. ?  ?hydrALAZINE 50 MG tablet ?Commonly known as: APRESOLINE ?Take 1 tablet (50 mg total) by mouth 3 (three) times daily. ?What changed: when to take this ?  ?insulin lispro 100 UNIT/ML KwikPen ?Commonly known as: HumaLOG KwikPen ?Inject 3 Units into the skin 3 (three) times daily with meals. ?  ?Lantus SoloStar 100 UNIT/ML Solostar Pen ?Generic drug: insulin glargine ?Inject 20 Units into the skin every  morning. ?  ?metoCLOPramide 5 MG tablet ?Commonly known as: REGLAN ?Take 1 tablet (5 mg total) by mouth 3 (three) times daily before meals. ?  ?multivitamin Tabs tablet ?Take 1 tablet by mouth at bedtime. ?  ?ondansetron 4 MG disintegrating tablet ?Commonly known as: ZOFRAN-ODT ?Take 1 tablet (4 mg total) by mouth every 6 (six) hours as needed for nausea or vomiting. ?  ?OneTouch Verio test strip ?Generic drug: glucose blood ?1 each by Other route 3 (three) times daily. And lancets 3/day ?  ?FREESTYLE TEST STRIPS test strip ?Generic drug: glucose blood ?Use as instructed ?  ?promethazine 25 MG suppository ?Commonly known as: PHENERGAN ?Place 1 suppository (25 mg total) rectally every 6 (six) hours as needed for nausea or vomiting. ?  ?sevelamer carbonate 800 MG tablet ?Commonly known as: RENVELA ?Take 1 tablet (800 mg total) by mouth 3 (three) times daily with meals. ?  ? ?  ? ? ?Disposition and follow-up:   ?BarbaraTROYCE Donovan was discharged from [facility] in Stable condition.  At the hospital follow up visit please address: ? ?1.  Nausea and vomiting- make sure patient has been able to eat and drink and has been using her coping strategies to deal with nausea. See if ODT zofran helped ? ?2. Anxiety depression- contributes at least in part to above. Make sure she has follow up scheduled with therapy/psychiatry ? ?3. DM- patient previously requested CGM make sure she  was able to get this ? ?2.  Labs / imaging needed at time of follow-up: bmp ? ?3.  Pending labs/ test needing follow-up: none ? ?Follow-up Appointments: ? ? ?Hospital Course by problem list: ?1. Intractable Nausea and Vomiting ?Patient had multiple recent hospital visits for the above. Partly due to psychosocial factors and self induced vomiting followed by uncontrollable nausea and then inability to tolerate PO. Often brought on when patient does not eat, then feels nauseous, then throws up. Patient has all of her antiemetic  prescriptions at home. This time will prescribe ODT zofran to see if this helps her keep foods down if she has another vomiting episode. Patient was given IV fluids and antiemetics in the hospital with rapid improvement. Unable to obtain subcutaneous prescription for patient to use at home. However spoke with clinic and if patient is nauseous or unable to keep down medications she can come to clinic and get fluids/IV medications.  ? ?2. IDDM; DKA ?Patient came in with elevated BHB and anion gap due to the above in addition to starvation ketosis. She was briefly started on the endotool before transitioning to regular insulin. She will go back to her home regimen. ? ?3. ESRD on PD ?Nephro was consulted, continued PD while hospitalized. ? ?5. Anxiety and Depression ?Continued home medications. ? ?Discharge Subjective ?Patient says it is embarrassing to keep coming back to the hospital. She feels a lot better today and is eating and drinking normally. Feels like she would do ok at home. Understands that she could call the clinic if she had nausea and vomiting rather than immediately go to the ED. ? ?Discharge Exam:   ?BP (!) 140/92 (BP Location: Right Arm)   Pulse 97   Temp 98.6 ?F (37 ?C) (Oral)   Resp 18   Ht 5\' 6"  (1.676 m)   Wt 65.2 kg   SpO2 99%   BMI 23.20 kg/m?  ? ?Constitutional: young woman resting in bed in no acute distress ?HEENT: normocephalic atraumatic, mucous membranes moist, conjunctiva non-erythematous ?Cardiovascular: regular rate and rhythm, no m/r/g ?Pulmonary/Chest: normal work of breathing on room air, lungs clear to auscultation bilaterally  ?Abdominal: soft, non-tender, non-distended, PD catheter in place ?MSK: normal bulk and tone ?Neurological: alert & oriented x 3, answering questions appropriately ?Skin: warm and dry ?Psych: normal affect ?  ? ?Pertinent Labs, Studies, and Procedures:  ? ?DG Chest Port 1 View ? ?Result Date: 04/05/2021 ?CLINICAL DATA:  Shortness of breath and hypoxia.  EXAM: PORTABLE CHEST 1 VIEW COMPARISON:  02/04/2021 and prior radiographs FINDINGS: The cardiomediastinal silhouette is unremarkable. There is no evidence of focal airspace disease, pulmonary edema, suspicious pulmonary nodule/mass, pleural effusion, or pneumothorax. No acute bony abnormalities are identified. IMPRESSION: No active disease. Electronically Signed   By: Margarette Canada M.D.   On: 04/05/2021 20:43  ?  ?CBC Latest Ref Rng & Units 04/05/2021 03/30/2021 03/29/2021  ?WBC 4.0 - 10.5 K/uL 8.4 6.2 -  ?Hemoglobin 12.0 - 15.0 g/dL 12.9 11.2(L) 13.6  ?Hematocrit 36.0 - 46.0 % 38.7 33.7(L) 40.0  ?Platelets 150 - 400 K/uL 439(H) 361 -  ?  ?BMP Latest Ref Rng & Units 04/07/2021 04/06/2021 04/06/2021  ?Glucose 70 - 99 mg/dL 255(H) 181(H) 141(H)  ?BUN 6 - 20 mg/dL 43(H) 44(H) 42(H)  ?Creatinine 0.44 - 1.00 mg/dL 9.96(H) 10.01(H) 9.49(H)  ?BUN/Creat Ratio 9 - 23 - - -  ?Sodium 135 - 145 mmol/L 133(L) 132(L) 134(L)  ?Potassium 3.5 - 5.1 mmol/L 4.2 4.6 4.7  ?  Chloride 98 - 111 mmol/L 100 99 101  ?CO2 22 - 32 mmol/L 22 22 17(L)  ?Calcium 8.9 - 10.3 mg/dL 9.2 9.4 9.3  ?  ?Discharge Instructions: ?Discharge Instructions   ? ? Call MD for:  persistant dizziness or light-headedness   Complete by: As directed ?  ? Call MD for:  persistant nausea and vomiting   Complete by: As directed ?  ? Call MD for:  temperature >100.4   Complete by: As directed ?  ? Diet - low sodium heart healthy   Complete by: As directed ?  ? Increase activity slowly   Complete by: As directed ?  ? No wound care   Complete by: As directed ?  ? ?  ? ? ?Signed: ?Scarlett Presto, MD ?04/07/2021, 1:54 PM   ?Pager: [phone]  ? ?

## 2021-04-07 NOTE — Discharge Instructions (Signed)
Darla Lesches ? ?You were recently admitted to [facility] for nausea and vomiting.  ? ?Continue taking your home medications with the following changes ? ?If you have nausea and vomiting and are unable to keep down your medications try taking zofran in the dissolvable form. You can let it dissolve under your tongue that way even if you vomit it will continue working.  ? ?You should seek further medical care if you have worsening nausea and vomiting and are unable to keep anything down, dizziness, abdominal pain, or dark tarry stools. ? ?We recommend that you see your primary care doctor in about a week to make sure that you continue to improve. We are so glad that you are feeling better. ? ?Sincerely, ?Scarlett Presto, MD ? ? ?

## 2021-04-09 LAB — GLUCOSE, CAPILLARY: Glucose-Capillary: 284 mg/dL — ABNORMAL HIGH (ref 70–99)

## 2021-04-12 ENCOUNTER — Encounter (HOSPITAL_COMMUNITY): Payer: Self-pay | Admitting: Emergency Medicine

## 2021-04-12 ENCOUNTER — Other Ambulatory Visit: Payer: Self-pay

## 2021-04-12 ENCOUNTER — Emergency Department (HOSPITAL_COMMUNITY)
Admission: EM | Admit: 2021-04-12 | Discharge: 2021-04-12 | Disposition: A | Discharge status: Home / Self Care | Payer: 59 | Attending: Emergency Medicine | Admitting: Emergency Medicine

## 2021-04-12 DIAGNOSIS — R7309 Other abnormal glucose: Secondary | ICD-10-CM | POA: Insufficient documentation

## 2021-04-12 DIAGNOSIS — Z992 Dependence on renal dialysis: Secondary | ICD-10-CM | POA: Diagnosis not present

## 2021-04-12 DIAGNOSIS — R000 Tachycardia, unspecified: Secondary | ICD-10-CM | POA: Insufficient documentation

## 2021-04-12 DIAGNOSIS — Z79899 Other long term (current) drug therapy: Secondary | ICD-10-CM | POA: Insufficient documentation

## 2021-04-12 DIAGNOSIS — R112 Nausea with vomiting, unspecified: Secondary | ICD-10-CM | POA: Diagnosis present

## 2021-04-12 DIAGNOSIS — R1084 Generalized abdominal pain: Secondary | ICD-10-CM | POA: Insufficient documentation

## 2021-04-12 DIAGNOSIS — E876 Hypokalemia: Secondary | ICD-10-CM | POA: Diagnosis not present

## 2021-04-12 LAB — URINALYSIS, ROUTINE W REFLEX MICROSCOPIC
Bilirubin Urine: NEGATIVE
Glucose, UA: >=500 mg/dL — AB
Ketones, ur: NEGATIVE mg/dL
Leukocytes,Ua: NEGATIVE
Nitrite: NEGATIVE
Protein, ur: >=300 mg/dL — AB
Specific Gravity, Urine: 1.013 (ref 1.005–1.030)
pH: 6 (ref 5.0–8.0)

## 2021-04-12 LAB — BASIC METABOLIC PANEL
Anion gap: 14 (ref 5–15)
BUN: 45 mg/dL — ABNORMAL HIGH (ref 6–20)
CO2: 23 mmol/L (ref 22–32)
Calcium: 9.9 mg/dL (ref 8.9–10.3)
Chloride: 99 mmol/L (ref 98–111)
Creatinine, Ser: 10.17 mg/dL — ABNORMAL HIGH (ref 0.44–1.00)
GFR, Estimated: 5 mL/min — ABNORMAL LOW (ref >60)
Glucose, Bld: 112 mg/dL — ABNORMAL HIGH (ref 70–99)
Potassium: 3.2 mmol/L — ABNORMAL LOW (ref 3.5–5.1)
Sodium: 136 mmol/L (ref 135–145)

## 2021-04-12 LAB — RAPID URINE DRUG SCREEN, HOSP PERFORMED
Amphetamines: NOT DETECTED
Barbiturates: NOT DETECTED
Benzodiazepines: NOT DETECTED
Cocaine: NOT DETECTED
Opiates: NOT DETECTED
Tetrahydrocannabinol: NOT DETECTED

## 2021-04-12 LAB — COMPREHENSIVE METABOLIC PANEL
ALT: 16 U/L (ref 0–44)
AST: 20 U/L (ref 15–41)
Albumin: 4.2 g/dL (ref 3.5–5.0)
Alkaline Phosphatase: 59 U/L (ref 38–126)
Anion gap: 20 — ABNORMAL HIGH (ref 5–15)
BUN: 44 mg/dL — ABNORMAL HIGH (ref 6–20)
CO2: 22 mmol/L (ref 22–32)
Calcium: 11 mg/dL — ABNORMAL HIGH (ref 8.9–10.3)
Chloride: 95 mmol/L — ABNORMAL LOW (ref 98–111)
Creatinine, Ser: 10.53 mg/dL — ABNORMAL HIGH (ref 0.44–1.00)
GFR, Estimated: 5 mL/min — ABNORMAL LOW (ref >60)
Glucose, Bld: 270 mg/dL — ABNORMAL HIGH (ref 70–99)
Potassium: 3.7 mmol/L (ref 3.5–5.1)
Sodium: 137 mmol/L (ref 135–145)
Total Bilirubin: 0.8 mg/dL (ref 0.3–1.2)
Total Protein: 8.2 g/dL — ABNORMAL HIGH (ref 6.5–8.1)

## 2021-04-12 LAB — CBC
HCT: 44.9 % (ref 36.0–46.0)
Hemoglobin: 14.7 g/dL (ref 12.0–15.0)
MCH: 28.1 pg (ref 26.0–34.0)
MCHC: 32.7 g/dL (ref 30.0–36.0)
MCV: 85.9 fL (ref 80.0–100.0)
Platelets: 481 10*3/uL — ABNORMAL HIGH (ref 150–400)
RBC: 5.23 MIL/uL — ABNORMAL HIGH (ref 3.87–5.11)
RDW: 15.9 % — ABNORMAL HIGH (ref 11.5–15.5)
WBC: 8.9 10*3/uL (ref 4.0–10.5)
nRBC: 0 % (ref 0.0–0.2)

## 2021-04-12 LAB — I-STAT VENOUS BLOOD GAS, ED
Acid-Base Excess: 4 mmol/L — ABNORMAL HIGH (ref 0.0–2.0)
Bicarbonate: 22.7 mmol/L (ref 20.0–28.0)
Calcium, Ion: 1.11 mmol/L — ABNORMAL LOW (ref 1.15–1.40)
HCT: 48 % — ABNORMAL HIGH (ref 36.0–46.0)
Hemoglobin: 16.3 g/dL — ABNORMAL HIGH (ref 12.0–15.0)
O2 Saturation: 99 %
Potassium: 3.6 mmol/L (ref 3.5–5.1)
Sodium: 134 mmol/L — ABNORMAL LOW (ref 135–145)
TCO2: 23 mmol/L (ref 22–32)
pCO2, Ven: 21.6 mmHg — ABNORMAL LOW (ref 44–60)
pH, Ven: 7.629 (ref 7.25–7.43)
pO2, Ven: 90 mmHg — ABNORMAL HIGH (ref 32–45)

## 2021-04-12 LAB — CBG MONITORING, ED
Glucose-Capillary: 266 mg/dL — ABNORMAL HIGH (ref 70–99)
Glucose-Capillary: 128 mg/dL — ABNORMAL HIGH (ref 70–99)

## 2021-04-12 LAB — I-STAT BETA HCG BLOOD, ED (MC, WL, AP ONLY): I-stat hCG, quantitative: <5 m[IU]/mL (ref <5)

## 2021-04-12 LAB — LIPASE, BLOOD: Lipase: 47 U/L (ref 11–51)

## 2021-04-12 LAB — BETA-HYDROXYBUTYRIC ACID: Beta-Hydroxybutyric Acid: 0.26 mmol/L (ref 0.05–0.27)

## 2021-04-12 MED ORDER — METOCLOPRAMIDE HCL 5 MG/ML IJ SOLN
10.0000 mg | Freq: Once | INTRAMUSCULAR | Status: AC
  Administered 2021-04-12: 10 mg via INTRAVENOUS
  Filled 2021-04-12: qty 2

## 2021-04-12 MED ORDER — SODIUM CHLORIDE 0.9 % IV SOLN: Freq: Once | INTRAVENOUS | Status: AC

## 2021-04-12 MED ORDER — DIPHENHYDRAMINE HCL 25 MG PO CAPS
25.0000 mg | ORAL_CAPSULE | Freq: Once | ORAL | Status: AC
  Administered 2021-04-12: 25 mg via ORAL
  Filled 2021-04-12: qty 1

## 2021-04-12 NOTE — ED Triage Notes (Signed)
Pt is a Type 1 diabetic with generalized abd pain, nausea, and vomiting since yesterday.  Reports CBG 301 at home. ?

## 2021-04-12 NOTE — ED Provider Notes (Signed)
?[facility] ?Provider Note ? ? ?CSN: [id number] ?Arrival date & time: 04/12/21  8676 ? ?  ? ?History ? ?Chief Complaint  ?Patient presents with  ? Abdominal Pain  ? ? ?Barbara Donovan is a 31 y.o. female. ? ? ?Abdominal Pain ? ?NV and abd pain that is generalized and moderate and constant since yesterday.  ?Hx of gastroparesis.  ?States that she feels unwell not believe down any food or water.  She states that she has had multiple episodes of nonbloody nonbilious emesis.  Have elevated blood sugar at home and came to the ER for evaluation of this. ? ?No urinary frequency urgency dysuria or hematuria.  No chest pain or difficulty breathing. ? ?  ? ?Home Medications ?Prior to Admission medications   ?Medication Sig Start Date End Date Taking? Authorizing Provider  ?amLODipine (NORVASC) 10 MG tablet Take 1 tablet (10 mg total) by mouth daily. ?Patient taking differently: Take 10 mg by mouth at bedtime. 03/09/20   Mercy Riding, MD  ?brimonidine (ALPHAGAN) 0.2 % ophthalmic solution Place 1 drop into the left eye 3 (three) times daily. 03/09/21   [provider]  ?calcitRIOL (ROCALTROL) 0.5 MCG capsule Take 0.5 mcg by mouth daily. 12/02/20   [provider]  ?carvedilol (COREG) 25 MG tablet Take 1 tablet (25 mg total) by mouth 2 (two) times daily. 03/07/19   Imogene Burn, PA-C  ?Continuous Blood Gluc Receiver DEVI 1 Units by Does not apply route 4 (four) times daily -  before meals and at bedtime. May substitute for cheapest monitor with insurance 03/27/21   Scarlett Presto, MD  ?Continuous Blood Gluc Sensor MISC 1 each by Does not apply route as directed. Use as directed every 14 days. May dispense FreeStyle [employer] or similar. 01/27/20   Antonieta Pert, MD  ?Continuous Glucose Monitor Sup MISC 1 Units by Does not apply route 4 (four) times daily -  before meals and at bedtime. May substitute for cheapest option with patient insurance 03/27/21 03/27/22  Scarlett Presto, MD  ?FLUoxetine (PROZAC) 20 MG capsule Take 20 mg by mouth daily. 03/16/21   [provider]  ?glucose blood (FREESTYLE TEST STRIPS) test strip Use as instructed ?Patient not taking: Reported on 03/21/2021 02/25/21   Terrilee Croak, MD  ?glucose blood (ONETOUCH VERIO) test strip 1 each by Other route 3 (three) times daily. And lancets 3/day ?Patient not taking: Reported on 04/05/2021 11/28/20   Renato Shin, MD  ?hydrALAZINE (APRESOLINE) 50 MG tablet Take 1 tablet (50 mg total) by mouth 3 (three) times daily. ?Patient taking differently: Take 50 mg by mouth in the morning and at bedtime. 12/31/20   Domenic Polite, MD  ?insulin glargine (LANTUS SOLOSTAR) 100 UNIT/ML Solostar Pen Inject 20 Units into the skin every morning. 02/05/21   Bonnielee Haff, MD  ?insulin lispro (HUMALOG KWIKPEN) 100 UNIT/ML KwikPen Inject 3 Units into the skin 3 (three) times daily with meals. 11/28/20   Renato Shin, MD  ?metoCLOPramide (REGLAN) 5 MG tablet Take 1 tablet (5 mg total) by mouth 3 (three) times daily before meals. 02/23/21   Sherwood Gambler, MD  ?multivitamin (RENA-VIT) TABS tablet Take 1 tablet by mouth at bedtime. 03/08/20   Mercy Riding, MD  ?ondansetron (ZOFRAN-ODT) 4 MG disintegrating tablet Take 1 tablet (4 mg total) by mouth every 6 (six) hours as needed for nausea or vomiting. 04/07/21   Demaio, Alexa, MD  ?promethazine (PHENERGAN) 25 MG suppository Place 1 suppository (  25 mg total) rectally every 6 (six) hours as needed for nausea or vomiting. ?Patient not taking: Reported on 03/25/2021 02/19/21   Margarita Mail, PA-C  ?sevelamer carbonate (RENVELA) 800 MG tablet Take 1 tablet (800 mg total) by mouth 3 (three) times daily with meals. 02/05/21   Bonnielee Haff, MD  ?   ? ?Allergies    ?Patient has no known allergies.   ? ?Review of Systems   ?Review of Systems  ?Gastrointestinal:  Positive for abdominal pain.  ? ?Physical Exam ?Updated Vital Signs ?BP (!) 158/102   Pulse 99   Temp 97.9 ?F (36.6 ?C) (Oral)    Resp 12   SpO2 100%  ?Physical Exam ?Vitals and nursing note reviewed.  ?Constitutional:   ?   General: She is not in acute distress. ?   Comments: Fatigued appearing non-toxic female  ?HENT:  ?   Head: Normocephalic and atraumatic.  ?   Nose: Nose normal.  ?   Mouth/Throat:  ?   Mouth: Mucous membranes are dry.  ?Eyes:  ?   General: No scleral icterus. ?Cardiovascular:  ?   Rate and Rhythm: Regular rhythm. Tachycardia present.  ?   Pulses: Normal pulses.  ?   Heart sounds: Normal heart sounds.  ?Pulmonary:  ?   Effort: Pulmonary effort is normal. No respiratory distress.  ?   Breath sounds: Normal breath sounds. No wheezing.  ?   Comments: Normal effort, normal rate ?Abdominal:  ?   Palpations: Abdomen is soft.  ?   Tenderness: There is no abdominal tenderness. There is no guarding or rebound.  ?Musculoskeletal:  ?   Cervical back: Normal range of motion.  ?   Right lower leg: No edema.  ?   Left lower leg: No edema.  ?Skin: ?   General: Skin is warm and dry.  ?   Capillary Refill: Capillary refill takes less than 2 seconds.  ?Neurological:  ?   Mental Status: She is alert. Mental status is at baseline.  ?Psychiatric:     ?   Mood and Affect: Mood normal.     ?   Behavior: Behavior normal.  ? ? ?ED Results / Procedures / Treatments   ?Labs ?(all labs ordered are listed, but only abnormal results are displayed) ?Labs Reviewed  ?COMPREHENSIVE METABOLIC PANEL - Abnormal; Notable for the following components:  ?    Result Value  ? Chloride 95 (*)   ? Glucose, Bld 270 (*)   ? BUN 44 (*)   ? Creatinine, Ser 10.53 (*)   ? Calcium 11.0 (*)   ? Total Protein 8.2 (*)   ? GFR, Estimated 5 (*)   ? Anion gap 20 (*)   ? All other components within normal limits  ?CBC - Abnormal; Notable for the following components:  ? RBC 5.23 (*)   ? RDW 15.9 (*)   ? Platelets 481 (*)   ? All other components within normal limits  ?URINALYSIS, ROUTINE W REFLEX MICROSCOPIC - Abnormal; Notable for the following components:  ? APPearance HAZY  (*)   ? Glucose, UA >=500 (*)   ? Hgb urine dipstick SMALL (*)   ? Protein, ur >=300 (*)   ? Bacteria, UA RARE (*)   ? All other components within normal limits  ?BASIC METABOLIC PANEL - Abnormal; Notable for the following components:  ? Potassium 3.2 (*)   ? Glucose, Bld 112 (*)   ? BUN 45 (*)   ? Creatinine, Ser 10.17 (*)   ?  GFR, Estimated 5 (*)   ? All other components within normal limits  ?CBG MONITORING, ED - Abnormal; Notable for the following components:  ? Glucose-Capillary 266 (*)   ? All other components within normal limits  ?I-STAT VENOUS BLOOD GAS, ED - Abnormal; Notable for the following components:  ? pH, Ven 7.629 (*)   ? pCO2, Ven 21.6 (*)   ? pO2, Ven 90 (*)   ? Acid-Base Excess 4.0 (*)   ? Sodium 134 (*)   ? Calcium, Ion 1.11 (*)   ? HCT 48.0 (*)   ? Hemoglobin 16.3 (*)   ? All other components within normal limits  ?CBG MONITORING, ED - Abnormal; Notable for the following components:  ? Glucose-Capillary 128 (*)   ? All other components within normal limits  ?LIPASE, BLOOD  ?BETA-HYDROXYBUTYRIC ACID  ?RAPID URINE DRUG SCREEN, HOSP PERFORMED  ?I-STAT BETA HCG BLOOD, ED ([facility], [facility], [facility] ONLY)  ? ? ?EKG ?None ? ?Radiology ?No results found. ? ?Procedures ?Procedures  ? ? ?Medications Ordered in ED ?Medications  ?0.9 %  sodium chloride infusion (0 mLs Intravenous Stopped 04/12/21 1240)  ?metoCLOPramide (REGLAN) injection 10 mg (10 mg Intravenous Given 04/12/21 1030)  ?diphenhydrAMINE (BENADRYL) capsule 25 mg (25 mg Oral Given 04/12/21 1029)  ? ? ?ED Course/ Medical Decision Making/ A&P ?  ?                        ?Medical Decision Making ?Amount and/or Complexity of Data Reviewed ?Labs: ordered. ? ?Risk ?Prescription drug management. ? ? ?This patient presents to the ED for concern of elevated BS, abd pain, NV, this involves a number of treatment options, and is a complaint that carries with it a high risk of complications and morbidity.  The differential diagnosis includes DKA, HHS, elevated BS, The  causes of generalized abdominal pain include but are not limited to AAA, mesenteric ischemia, appendicitis, diverticulitis, DKA, gastritis, gastroenteritis, AMI, nephrolithiasis, pancreatitis, peritonitis, adrenal i

## 2021-04-14 ENCOUNTER — Telehealth: Payer: Self-pay

## 2021-04-14 NOTE — Telephone Encounter (Signed)
Pt insurance rep called requested a HFU for pt that was discharged today was given 4/3 with [facility] @ 1:45 ?

## 2021-04-15 NOTE — Progress Notes (Signed)
? ?Cardiology Office Note   ? ?Date:  04/28/2021  ? ?ID:  Barbara Donovan, DOB 08-06-1990, MRN [id number] ? ? ?PCP:  Scarlett Presto, MD ?  ?[place]  ?Cardiologist:  Fransico Him, MD   ?Advanced Practice Provider:  No care team member to display ?Electrophysiologist:  None  ? ?[id number]}  ? ?No chief complaint on file. ? ? ?History of Present Illness:  ?Barbara Donovan is a 31 y.o. female with history of hypertension, DM on insulin pump, obesity  ? ?Had a telemedicine visit with Dr. Radford Pax 02/01/2019 at which time she was still having trouble with lower extremity edema.  Blood pressure was running high and chlorthalidone 25 mg daily was added.  Low Sodium diet recommended. ?  ?Patient's creatinine increased from 1.08-1.42 so chlorthalidone was stopped and Cardizem was increased from 120 mg daily to 180 mg daily.  She was referred to the hypertension clinic.  Her edema had improved.  Creatinine had improved so irbesartan 75 mg daily was started. Patient saw Dr. Oval Linsey 03/22/19 and hydralazine 50 mg bid added. ? ?Patient discharged 4/9//23 with DKA secondary to N/V due to gastroparesis, now on HD.Multiple hospitalizations and ED visits this year for similar complaints.  ? ?Patient does peritoneal dialysis and BP usually low in am. Gets 160/99 during the day 2-3 times a week. Has lost 30 lbs. No cardiac complaints.  ? ? ?Past Medical History:  ?Diagnosis Date  ? Asthma   ? as a child, no problems as an adult, no inhaler  ? Cataract   ? NS OU  ? Chronic hypertension during pregnancy, antepartum 08/19/2017  ? Dehydration 01/28/2018  ? Depression during pregnancy, antepartum 07/07/2017  ? 6/20: Short trial of zoloft previously, reports didn't help much but also didn't give it a chance Discussed r/b/a SSRIs in pregnancy, agrees to try Zoloft again, rx sent No SI/HI/red flags  ? Diabetes ([place])   ? TYPE I. A1C 7.5% 05/31/20  ? Diabetic retinopathy ([place]) 06/09/2017  ? 07/2017 with bilateral severe  diabetic non-proliferative retinopathy with macular edema.  ? ESRD on peritoneal dialysis [facility])   ? HTN (hypertension)   ? Hypertensive retinopathy   ? OU  ? Hypokalemia 01/22/2018  ? Hypomagnesemia 01/28/2018  ? Intractable nausea and vomiting 01/22/2018  ? Intrauterine growth restriction (IUGR) affecting care of mother 12/22/2017  ? Morbid obesity ([place])   ? 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 Severe hyperemesis gravidarum 10/30/2017  ? Type I diabetes mellitus ([place]) 07/07/2017  ? Current Diabetic Medications:  Insulin  [x]  Aspirin 81 mg daily after 12 weeks (? A2/B GDM)  Required Referrals for A1GDM or A2GDM: [x]  Diabetes Education and Testing Supplies [x]  Nutrition Cousult  For A2/B GDM or higher classes of DM [x]  Diabetes Education and Testing Supplies [x]  Nutrition Counsult [x]  Fetal ECHO after 20 weeks  [x]  Eye exam for retina evaluation - severe retinopathy 7/19  Base  ? Ventricular septal defect (VSD) of fetus in singleton pregnancy, antepartum 09/30/2017  ? May go to newborn nursery per Dr. Lenard Simmer Echo prior to discharge  ? ? ?Past Surgical History:  ?Procedure Laterality Date  ? 25 GAUGE PARS PLANA VITRECTOMY WITH 20 GAUGE MVR PORT FOR MACULAR HOLE Left 07/20/2018  ? Procedure: [place] VITRECTOMY LEFT EYE;  Surgeon: Bernarda Caffey, MD;  Location: [facility];  Service: Ophthalmology;  Laterality: Left;  ? CAPD INSERTION N/A 09/10/2020  ? Procedure: LAPAROSCOPIC INSERTION CONTINUOUS AMBULATORY PERITONEAL DIALYSIS  (CAPD) CATHETER;  Surgeon: Serafina Mitchell, MD;  Location: [facility];  Service: Vascular;  Laterality: N/A;  ? CESAREAN SECTION N/A 12/26/2017  ? Procedure: CESAREAN SECTION;  Surgeon: Osborne Oman, MD;  Location: [facility];  Service: Obstetrics;  Laterality: N/A;  ? EYE EXAMINATION UNDER ANESTHESIA Right 07/20/2018  ? Procedure: Eye Exam Under Anesthesia RIGHT EYE;  Surgeon: Bernarda Caffey, MD;  Location: [facility];  Service: Ophthalmology;  Laterality: Right;  ? EYE SURGERY Left 07/2018   ? GAS INSERTION Left 07/19/2019  ? Procedure: INSERTION OF GAS;  Surgeon: Bernarda Caffey, MD;  Location: [place] [facility];  Service: Ophthalmology;  Laterality: Left;  SF6  ? INJECTION OF SILICONE OIL Left 09/24/5881  ? Procedure: Injection Of Silicone Oil LEFT EYE;  Surgeon: Bernarda Caffey, MD;  Location: [facility];  Service: Ophthalmology;  Laterality: Left;  ? IR FLUORO GUIDE CV LINE RIGHT  06/02/2020  ? IR US GUIDE VASC ACCESS RIGHT  06/02/2020  ? LASER PHOTO ABLATION Right 07/20/2018  ? Procedure: Laser Photo Ablation RIGHT EYE;  Surgeon: Bernarda Caffey, MD;  Location: [facility];  Service: Ophthalmology;  Laterality: Right;  ? MEMBRANE PEEL Left 07/20/2018  ? Procedure: Membrane Peel LEFT EYE;  Surgeon: Bernarda Caffey, MD;  Location: [facility];  Service: Ophthalmology;  Laterality: Left;  ? MEMBRANE PEEL Left 07/19/2019  ? Procedure: MEMBRANE PEEL;  Surgeon: Bernarda Caffey, MD;  Location: [facility];  Service: Ophthalmology;  Laterality: Left;  ? MITOMYCIN C APPLICATION Bilateral 02/23/4980  ? Procedure: Avastin Application;  Surgeon: Bernarda Caffey, MD;  Location: [facility];  Service: Ophthalmology;  Laterality: Bilateral;  ? PHOTOCOAGULATION WITH LASER Left 07/20/2018  ? Procedure: Photocoagulation With Laser LEFT EYE;  Surgeon: Bernarda Caffey, MD;  Location: [facility];  Service: Ophthalmology;  Laterality: Left;  ? PHOTOCOAGULATION WITH LASER Left 07/19/2019  ? Procedure: PHOTOCOAGULATION WITH LASER;  Surgeon: Bernarda Caffey, MD;  Location: [place];  Service: Ophthalmology;  Laterality: Left;  ? RETINAL DETACHMENT SURGERY Left 07/20/2018  ? Dr. Bernarda Caffey  ? SILICON OIL REMOVAL Left 07/19/2019  ? Procedure: 25g PARS PLANA VITRECTOMY WITH SILICON OIL REMOVAL;  Surgeon: Bernarda Caffey, MD;  Location: [place] [facility];  Service: Ophthalmology;  Laterality: Left;  ? WISDOM TOOTH EXTRACTION    ? ? ?Current Medications: ?Current Meds  ?Medication Sig  ? amLODipine (NORVASC) 10 MG tablet Take 1 tablet (10 mg total) by mouth daily.  ? brimonidine (ALPHAGAN) 0.2 % ophthalmic solution Place  1 drop into the left eye 3 (three) times daily.  ? calcitRIOL (ROCALTROL) 0.5 MCG capsule Take 0.5 mcg by mouth daily.  ? carvedilol (COREG) 25 MG tablet Take 1 tablet (25 mg total) by mouth 2 (two) times daily.  ? Continuous Blood Gluc Receiver DEVI 1 Units by Does not apply route 4 (four) times daily -  before meals and at bedtime. May substitute for cheapest monitor with insurance  ? Continuous Blood Gluc Sensor MISC 1 each by Does not apply route as directed. Use as directed every 14 days. May dispense FreeStyle [employer] or similar.  ? Continuous Glucose Monitor Sup MISC 1 Units by Does not apply route 4 (four) times daily -  before meals and at bedtime. May substitute for cheapest option with patient insurance  ? FLUoxetine (PROZAC) 20 MG capsule Take 20 mg by mouth daily.  ? glucose blood (ONETOUCH VERIO) test strip 1 each by Other route 3 (three) times daily. And lancets 3/day  ? insulin glargine (LANTUS SOLOSTAR) 100 UNIT/ML Solostar Pen  Inject 20 Units into the skin every morning.  ? insulin lispro (HUMALOG KWIKPEN) 100 UNIT/ML KwikPen 3 times a day (just before each meal) 4-3-3 units, and pen needles 4/day  ? metoCLOPramide (REGLAN) 5 MG tablet Take 1 tablet (5 mg total) by mouth 3 (three) times daily before meals.  ? multivitamin (RENA-VIT) TABS tablet Take 1 tablet by mouth at bedtime.  ? ondansetron (ZOFRAN-ODT) 4 MG disintegrating tablet Take 1 tablet (4 mg total) by mouth every 6 (six) hours as needed for nausea or vomiting.  ? promethazine (PHENERGAN) 12.5 MG tablet Take 1 tablet (12.5 mg total) by mouth every 6 (six) hours as needed for nausea or vomiting.  ? sevelamer carbonate (RENVELA) 800 MG tablet Take 1 tablet (800 mg total) by mouth 3 (three) times daily with meals.  ? [DISCONTINUED] hydrALAZINE (APRESOLINE) 50 MG tablet Take 50 mg by mouth 2 (two) times daily.  ?  ? ?Allergies:   Patient has no known allergies.  ? ?Social History  ? ?Socioeconomic History  ? Marital status:  Significant Other  ?  Spouse name: Not on file  ? Number of children: Not on file  ? Years of education: Not on file  ? Highest education level: Not on file  ?Occupational History  ? Not on file  ?Tobacco Use

## 2021-04-20 ENCOUNTER — Encounter: Payer: Self-pay | Admitting: Internal Medicine

## 2021-04-20 ENCOUNTER — Ambulatory Visit (INDEPENDENT_AMBULATORY_CARE_PROVIDER_SITE_OTHER): Payer: 59 | Admitting: Internal Medicine

## 2021-04-20 VITALS — BP 124/78 | HR 84 | Temp 97.8°F | Ht 66.0 in | Wt 139.6 lb

## 2021-04-20 DIAGNOSIS — F32A Depression, unspecified: Secondary | ICD-10-CM | POA: Diagnosis not present

## 2021-04-20 DIAGNOSIS — R112 Nausea with vomiting, unspecified: Secondary | ICD-10-CM

## 2021-04-20 DIAGNOSIS — F419 Anxiety disorder, unspecified: Secondary | ICD-10-CM | POA: Diagnosis not present

## 2021-04-20 DIAGNOSIS — E785 Hyperlipidemia, unspecified: Secondary | ICD-10-CM | POA: Diagnosis not present

## 2021-04-20 DIAGNOSIS — E1065 Type 1 diabetes mellitus with hyperglycemia: Secondary | ICD-10-CM | POA: Diagnosis not present

## 2021-04-20 DIAGNOSIS — I10 Essential (primary) hypertension: Secondary | ICD-10-CM | POA: Diagnosis not present

## 2021-04-20 NOTE — Assessment & Plan Note (Signed)
At goal today, 124/78 ?- continue amlodipine hydralazine, and coreg ?

## 2021-04-20 NOTE — Patient Instructions (Signed)
Barbara Donovan ? ?It was a pleasure seeing you in the clinic today.  ? ?We talked about your nausea your stress levels, your diabetes, and your cholesterol. ? ?Nausea- continue using the patch and the benadryl to help control your flares ?2. Anxiety/depression- continue to take your prozac and continue therapy ?3. High cholesterol- we recommend talking to your endocrinologist about starting a medication to help with your cholesterol. They can also discuss which medications may work best for you since you had difficulty tolerating the statin medication in the past ? ?Please call our clinic at [phone] if you have any questions or concerns. The best time to call is Monday-Friday from 9am-4pm, but there is someone available 24/7 at the same number. If you need medication refills, please notify your pharmacy one week in advance and they will send [name] a request. ?  ?Thank you for letting us take part in your care. We look forward to seeing you next time! ? ?

## 2021-04-20 NOTE — Assessment & Plan Note (Signed)
Has been able to obtain her CGM she was prescribed at hospital DC. She is scheduled to see her endocrinologist tomorrow. ?- f/u with endocrinology ?

## 2021-04-20 NOTE — Progress Notes (Signed)
? ?  CC: hospital follow up ? ?HPI: ? ?[name] is a 31 y.o. PMH noted below, who presents to the [facility] with complaints of nausea. To see the management of his acute and chronic conditions, please refer to the A&P note under the encounters tab.  ? ?Past Medical History:  ?Diagnosis Date  ? Asthma   ? as a child, no problems as an adult, no inhaler  ? Cataract   ? NS OU  ? Chronic hypertension during pregnancy, antepartum 08/19/2017  ? Dehydration 01/28/2018  ? Depression during pregnancy, antepartum 07/07/2017  ? 6/20: Short trial of zoloft previously, reports didn't help much but also didn't give it a chance Discussed r/b/a SSRIs in pregnancy, agrees to try Zoloft again, rx sent No SI/HI/red flags  ? Diabetes ([place])   ? TYPE I. A1C 7.5% 05/31/20  ? Diabetic retinopathy ([place]) 06/09/2017  ? 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  ? ESRD on peritoneal dialysis [facility])   ? HTN (hypertension)   ? Hypertensive retinopathy   ? OU  ? Hypokalemia 01/22/2018  ? Hypomagnesemia 01/28/2018  ? Intractable nausea and vomiting 01/22/2018  ? Intrauterine growth restriction (IUGR) affecting care of mother 12/22/2017  ? Morbid obesity ([place])   ? 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 Severe hyperemesis gravidarum 10/30/2017  ? Type I diabetes mellitus ([place]) 07/07/2017  ? Current Diabetic Medications:  Insulin  [x]  Aspirin 81 mg daily after 12 weeks (? A2/B GDM)  Required Referrals for A1GDM or A2GDM: [x]  Diabetes Education and Testing Supplies [x]  Nutrition Cousult  For A2/B GDM or higher classes of DM [x]  Diabetes Education and Testing Supplies [x]  Nutrition Counsult [x]  Fetal ECHO after 20 weeks  [x]  Eye exam for retina evaluation - severe retinopathy 7/19  Base  ? Ventricular septal defect (VSD) of fetus in singleton pregnancy, antepartum 09/30/2017  ? May go to newborn nursery per Dr. Lenard Simmer Echo prior to discharge  ? ?Review of Systems:  positive for nausea, vomiting, negative  for dizziness, abdominal pain, SI ? ?Physical Exam: ?Gen: young woman in NAD ?HEENT: normocephalic atraumatic, MMM ?CV: RRR, no m/r/g  ?Resp: CTAB, normal WOB  ?GI: soft, nontender, normal bowel sounds ?MSK: moves all extremities without difficulty ?Skin:warm and dry, no LEE ?Neuro:alert answering questions appropriately, normal gait ?Psych: normal affect ? ? ?Assessment & Plan:  ? ?See Encounters Tab for problem based charting. ? ?Patient discussed with Dr. Heber [place]  ? ?

## 2021-04-20 NOTE — Assessment & Plan Note (Signed)
This has improved, no vomiting since last Thursday. She has tried an over the counter nausea patch as well as benadryl. These have helped her to stop the episodes and helped keep her out of the hospital. ?- continue PRN patch, benadryl, and zofran ?

## 2021-04-20 NOTE — Assessment & Plan Note (Signed)
Patient with elevated lipid panel. Has tried statin in the past on two different occasion. First statin she tried gave her muscle cramps. The second one she tried made her nauseous. Given her history of having diabetes for 20 years and her history of ESRD related to diabetes she is someone I would want to treat more aggressively. However, this is complicated by her nausea/vomiting as many lipid therapies may cause GI upset. A PCSK9 may be a good option for her vs Zetia. Recommend she discuss with her endocrinologist at her appointment on 4/4. ?- consider lipid lowering therapy ?

## 2021-04-20 NOTE — Assessment & Plan Note (Signed)
Denies any SI/HI today. Has been in therapy for about 2 weeks and thinks it is helping. Doing well on the prozac. ?- continue therapy and prozac 20 daily ?

## 2021-04-21 ENCOUNTER — Ambulatory Visit (INDEPENDENT_AMBULATORY_CARE_PROVIDER_SITE_OTHER): Payer: 59 | Admitting: Endocrinology

## 2021-04-21 VITALS — BP 124/82 | HR 76 | Ht 66.0 in | Wt 142.2 lb

## 2021-04-21 DIAGNOSIS — E1065 Type 1 diabetes mellitus with hyperglycemia: Secondary | ICD-10-CM

## 2021-04-21 MED ORDER — INSULIN LISPRO (1 UNIT DIAL) 100 UNIT/ML (KWIKPEN): PEN_INJECTOR | SUBCUTANEOUS | 3 refills | Status: DC

## 2021-04-21 NOTE — Patient Instructions (Addendum)
check your blood sugar 3 times a day: before the 3 meals, and at bedtime.  also check if you have symptoms of your blood sugar being too high or too low.  please keep a record of the readings and bring it to your next appointment here (or you can bring the meter itself).  You can write it on any piece of paper.  please call us sooner if your blood sugar goes below 70, or if you have a lot of readings over 200.   ?Please increase the breakfast Humalog to 4 units.     ?I am fine with your taking cholesterol medication.  However, the benefit of this in dialysis patients is controversial.   ?Please come back for a follow-up appointment in 3 months.   ?

## 2021-04-21 NOTE — Progress Notes (Signed)
? ?Subjective:  ? ? Patient ID: Barbara Donovan, female    DOB: Jun 17, 1990, 31 y.o.   MRN: [id number] ? ?HPI ?Pt returns for f/u of diabetes mellitus:  ?DM type: 1 ?Dx'ed: 2003 ?Complications: PDR, GP, and ESRD (on home PD) ?Therapy: insulin since 2009  ?DKA: last episode was 2022.  ?Severe hypoglycemia: once (2022) ?Pancreatitis: never ?Pancreatic imaging: never.  ?Other: she took pump rx 2020-2022; she stopped due to DKA; she takes home overnight PD; she takes MDI--however, plan is to simplify insulin schedule by emphasizing basal, which has helped; She eats meals at 8AM, 12N, and 6PM; GP compromises rx; fructosamine converts to A1c approx 1% higher than A1c itself.  ?Interval history:  I reviewed continuous glucose monitor data.  Glucose varies from 90-320.  It is in general highest at [place], but it is otherwise flat.   She was back in hosp 3/22.  Pt says she does not miss the insulin.  She seldom has hypoglycemia, and these episodes are mild.  ?Past Medical History:  ?Diagnosis Date  ? Asthma   ? as a child, no problems as an adult, no inhaler  ? Cataract   ? NS OU  ? Chronic hypertension during pregnancy, antepartum 08/19/2017  ? Dehydration 01/28/2018  ? Depression during pregnancy, antepartum 07/07/2017  ? 6/20: Short trial of zoloft previously, reports didn't help much but also didn't give it a chance Discussed r/b/a SSRIs in pregnancy, agrees to try Zoloft again, rx sent No SI/HI/red flags  ? Diabetes ([place])   ? TYPE I. A1C 7.5% 05/31/20  ? Diabetic retinopathy ([place]) 06/09/2017  ? 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  ? ESRD on peritoneal dialysis [facility])   ? HTN (hypertension)   ? Hypertensive retinopathy   ? OU  ? Hypokalemia 01/22/2018  ? Hypomagnesemia 01/28/2018  ? Intractable nausea and vomiting 01/22/2018  ? Intrauterine growth restriction (IUGR) affecting care of mother 12/22/2017  ? Morbid obesity ([place])   ? 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  Severe hyperemesis gravidarum 10/30/2017  ? Type I diabetes mellitus ([place]) 07/07/2017  ? Current Diabetic Medications:  Insulin  [x]  Aspirin 81 mg daily after 12 weeks (? A2/B GDM)  Required Referrals for A1GDM or A2GDM: [x]  Diabetes Education and Testing Supplies [x]  Nutrition Cousult  For A2/B GDM or higher classes of DM [x]  Diabetes Education and Testing Supplies [x]  Nutrition Counsult [x]  Fetal ECHO after 20 weeks  [x]  Eye exam for retina evaluation - severe retinopathy 7/19  Base  ? Ventricular septal defect (VSD) of fetus in singleton pregnancy, antepartum 09/30/2017  ? May go to newborn nursery per Dr. Lenard Simmer Echo prior to discharge  ? ? ?Past Surgical History:  ?Procedure Laterality Date  ? 25 GAUGE PARS PLANA VITRECTOMY WITH 20 GAUGE MVR PORT FOR MACULAR HOLE Left 07/20/2018  ? Procedure: [place] VITRECTOMY LEFT EYE;  Surgeon: Bernarda Caffey, MD;  Location: [place];  Service: Ophthalmology;  Laterality: Left;  ? CAPD INSERTION N/A 09/10/2020  ? Procedure: LAPAROSCOPIC INSERTION CONTINUOUS AMBULATORY PERITONEAL DIALYSIS  (CAPD) CATHETER;  Surgeon: Serafina Mitchell, MD;  Location: [place];  Service: Vascular;  Laterality: N/A;  ? CESAREAN SECTION N/A 12/26/2017  ? Procedure: CESAREAN SECTION;  Surgeon: Osborne Oman, MD;  Location: [place];  Service: Obstetrics;  Laterality: N/A;  ? EYE EXAMINATION UNDER ANESTHESIA Right 07/20/2018  ? Procedure: Eye Exam Under Anesthesia RIGHT EYE;  Surgeon: Bernarda Caffey, MD;  Location: [place];  Service: Ophthalmology;  Laterality: Right;  ? EYE SURGERY Left 07/2018  ? GAS INSERTION Left 07/19/2019  ? Procedure: INSERTION OF GAS;  Surgeon: Bernarda Caffey, MD;  Location: [place];  Service: Ophthalmology;  Laterality: Left;  SF6  ? INJECTION OF SILICONE OIL Left 01/20/2438  ? Procedure: Injection Of Silicone Oil LEFT EYE;  Surgeon: Bernarda Caffey, MD;  Location: [place];  Service: Ophthalmology;  Laterality: Left;  ? IR FLUORO GUIDE CV LINE RIGHT  06/02/2020  ? IR US GUIDE  VASC ACCESS RIGHT  06/02/2020  ? LASER PHOTO ABLATION Right 07/20/2018  ? Procedure: Laser Photo Ablation RIGHT EYE;  Surgeon: Bernarda Caffey, MD;  Location: [place];  Service: Ophthalmology;  Laterality: Right;  ? MEMBRANE PEEL Left 07/20/2018  ? Procedure: Membrane Peel LEFT EYE;  Surgeon: Bernarda Caffey, MD;  Location: [place];  Service: Ophthalmology;  Laterality: Left;  ? MEMBRANE PEEL Left 07/19/2019  ? Procedure: MEMBRANE PEEL;  Surgeon: Bernarda Caffey, MD;  Location: [place];  Service: Ophthalmology;  Laterality: Left;  ? MITOMYCIN C APPLICATION Bilateral 1/0/2725  ? Procedure: Avastin Application;  Surgeon: Bernarda Caffey, MD;  Location: [place];  Service: Ophthalmology;  Laterality: Bilateral;  ? PHOTOCOAGULATION WITH LASER Left 07/20/2018  ? Procedure: Photocoagulation With Laser LEFT EYE;  Surgeon: Bernarda Caffey, MD;  Location: [place];  Service: Ophthalmology;  Laterality: Left;  ? PHOTOCOAGULATION WITH LASER Left 07/19/2019  ? Procedure: PHOTOCOAGULATION WITH LASER;  Surgeon: Bernarda Caffey, MD;  Location: [place];  Service: Ophthalmology;  Laterality: Left;  ? RETINAL DETACHMENT SURGERY Left 07/20/2018  ? Dr. Bernarda Caffey  ? SILICON OIL REMOVAL Left 07/19/2019  ? Procedure: 25g PARS PLANA VITRECTOMY WITH SILICON OIL REMOVAL;  Surgeon: Bernarda Caffey, MD;  Location: [place];  Service: Ophthalmology;  Laterality: Left;  ? WISDOM TOOTH EXTRACTION    ? ? ?Social History  ? ?Socioeconomic History  ? Marital status: Significant Other  ?  Spouse name: Not on file  ? Number of children: Not on file  ? Years of education: Not on file  ? Highest education level: Not on file  ?Occupational History  ? Not on file  ?Tobacco Use  ? Smoking status: Never  ? Smokeless tobacco: Never  ?Vaping Use  ? Vaping Use: Never used  ?Substance and Sexual Activity  ? Alcohol use: Not Currently  ? Drug use: Never  ? Sexual activity: Not Currently  ?  Birth control/protection: None  ?Other Topics Concern  ? Not on file  ?Social History Narrative  ? Not on file   ? ?Social Determinants of Health  ? ?Financial Resource Strain: Not on file  ?Food Insecurity: Not on file  ?Transportation Needs: Not on file  ?Physical Activity: Not on file  ?Stress: Not on file  ?Social Connections: Not on file  ?Intimate Partner Violence: Not on file  ? ? ?No current facility-administered medications on file prior to visit.  ? ?Current Outpatient Medications on File Prior to Visit  ?Medication Sig Dispense Refill  ? amLODipine (NORVASC) 10 MG tablet Take 1 tablet (10 mg total) by mouth daily. (Patient taking differently: Take 10 mg by mouth at bedtime.) 90 tablet 1  ? brimonidine (ALPHAGAN) 0.2 % ophthalmic solution Place 1 drop into the left eye 3 (three) times daily.    ? calcitRIOL (ROCALTROL) 0.5 MCG capsule Take 0.5 mcg by mouth daily.    ? carvedilol (COREG) 25 MG tablet Take 1 tablet (25 mg total) by mouth 2 (two) times daily. [place]  tablet 3  ? Continuous Blood Gluc Receiver DEVI 1 Units by Does not apply route 4 (four) times daily -  before meals and at bedtime. May substitute for cheapest monitor with insurance 1 Units 0  ? Continuous Blood Gluc Sensor MISC 1 each by Does not apply route as directed. Use as directed every 14 days. May dispense FreeStyle [employer] or similar. 1 each 0  ? Continuous Glucose Monitor Sup MISC 1 Units by Does not apply route 4 (four) times daily -  before meals and at bedtime. May substitute for cheapest option with patient insurance 1 Units 0  ? FLUoxetine (PROZAC) 20 MG capsule Take 20 mg by mouth daily.    ? glucose blood (FREESTYLE TEST STRIPS) test strip Use as instructed 100 each 0  ? glucose blood (ONETOUCH VERIO) test strip 1 each by Other route 3 (three) times daily. And lancets 3/day 300 each 3  ? hydrALAZINE (APRESOLINE) 50 MG tablet Take 1 tablet (50 mg total) by mouth 3 (three) times daily. (Patient taking differently: Take 50 mg by mouth in the morning and at bedtime.)    ? insulin glargine (LANTUS SOLOSTAR) 100 UNIT/ML Solostar Pen  Inject 20 Units into the skin every morning. 15 mL 2  ? metoCLOPramide (REGLAN) 5 MG tablet Take 1 tablet (5 mg total) by mouth 3 (three) times daily before meals. 90 tablet 0  ? multivitamin (RENA-VIT) TABS

## 2021-04-21 NOTE — Progress Notes (Signed)
Internal Medicine Clinic Attending ° °Case discussed with Dr. DeMaio  At the time of the visit.  We reviewed the resident’s history and exam and pertinent patient test results.  I agree with the assessment, diagnosis, and plan of care documented in the resident’s note. ° ° °

## 2021-04-22 ENCOUNTER — Other Ambulatory Visit: Payer: Self-pay

## 2021-04-22 ENCOUNTER — Encounter (HOSPITAL_COMMUNITY): Payer: Self-pay | Admitting: *Deleted

## 2021-04-22 ENCOUNTER — Inpatient Hospital Stay (HOSPITAL_COMMUNITY)
Admission: EM | Admit: 2021-04-22 | Discharge: 2021-04-26 | DRG: 637 | Disposition: A | Discharge status: Home / Self Care | Payer: 59 | Attending: Internal Medicine | Admitting: Internal Medicine

## 2021-04-22 DIAGNOSIS — F419 Anxiety disorder, unspecified: Secondary | ICD-10-CM | POA: Diagnosis present

## 2021-04-22 DIAGNOSIS — D631 Anemia in chronic kidney disease: Secondary | ICD-10-CM | POA: Diagnosis present

## 2021-04-22 DIAGNOSIS — M898X9 Other specified disorders of bone, unspecified site: Secondary | ICD-10-CM | POA: Diagnosis present

## 2021-04-22 DIAGNOSIS — E10319 Type 1 diabetes mellitus with unspecified diabetic retinopathy without macular edema: Secondary | ICD-10-CM | POA: Diagnosis present

## 2021-04-22 DIAGNOSIS — I10 Essential (primary) hypertension: Secondary | ICD-10-CM | POA: Diagnosis present

## 2021-04-22 DIAGNOSIS — E1043 Type 1 diabetes mellitus with diabetic autonomic (poly)neuropathy: Secondary | ICD-10-CM | POA: Diagnosis present

## 2021-04-22 DIAGNOSIS — N186 End stage renal disease: Secondary | ICD-10-CM | POA: Diagnosis present

## 2021-04-22 DIAGNOSIS — Z823 Family history of stroke: Secondary | ICD-10-CM

## 2021-04-22 DIAGNOSIS — J45909 Unspecified asthma, uncomplicated: Secondary | ICD-10-CM | POA: Diagnosis present

## 2021-04-22 DIAGNOSIS — E1022 Type 1 diabetes mellitus with diabetic chronic kidney disease: Secondary | ICD-10-CM | POA: Diagnosis present

## 2021-04-22 DIAGNOSIS — Z794 Long term (current) use of insulin: Secondary | ICD-10-CM

## 2021-04-22 DIAGNOSIS — E1010 Type 1 diabetes mellitus with ketoacidosis without coma: Secondary | ICD-10-CM | POA: Diagnosis not present

## 2021-04-22 DIAGNOSIS — Z825 Family history of asthma and other chronic lower respiratory diseases: Secondary | ICD-10-CM

## 2021-04-22 DIAGNOSIS — Z833 Family history of diabetes mellitus: Secondary | ICD-10-CM

## 2021-04-22 DIAGNOSIS — K3184 Gastroparesis: Secondary | ICD-10-CM | POA: Diagnosis present

## 2021-04-22 DIAGNOSIS — E785 Hyperlipidemia, unspecified: Secondary | ICD-10-CM | POA: Diagnosis present

## 2021-04-22 DIAGNOSIS — R112 Nausea with vomiting, unspecified: Secondary | ICD-10-CM | POA: Diagnosis present

## 2021-04-22 DIAGNOSIS — E871 Hypo-osmolality and hyponatremia: Secondary | ICD-10-CM | POA: Diagnosis not present

## 2021-04-22 DIAGNOSIS — Z8249 Family history of ischemic heart disease and other diseases of the circulatory system: Secondary | ICD-10-CM

## 2021-04-22 DIAGNOSIS — E11319 Type 2 diabetes mellitus with unspecified diabetic retinopathy without macular edema: Secondary | ICD-10-CM | POA: Diagnosis present

## 2021-04-22 DIAGNOSIS — E876 Hypokalemia: Secondary | ICD-10-CM | POA: Diagnosis present

## 2021-04-22 DIAGNOSIS — E1110 Type 2 diabetes mellitus with ketoacidosis without coma: Secondary | ICD-10-CM | POA: Diagnosis present

## 2021-04-22 DIAGNOSIS — Z992 Dependence on renal dialysis: Secondary | ICD-10-CM

## 2021-04-22 DIAGNOSIS — Z20822 Contact with and (suspected) exposure to covid-19: Secondary | ICD-10-CM | POA: Diagnosis present

## 2021-04-22 DIAGNOSIS — R0902 Hypoxemia: Secondary | ICD-10-CM | POA: Diagnosis present

## 2021-04-22 DIAGNOSIS — A419 Sepsis, unspecified organism: Secondary | ICD-10-CM | POA: Diagnosis present

## 2021-04-22 DIAGNOSIS — R1110 Vomiting, unspecified: Secondary | ICD-10-CM | POA: Diagnosis not present

## 2021-04-22 DIAGNOSIS — Z79899 Other long term (current) drug therapy: Secondary | ICD-10-CM

## 2021-04-22 DIAGNOSIS — E860 Dehydration: Secondary | ICD-10-CM | POA: Diagnosis present

## 2021-04-22 DIAGNOSIS — I120 Hypertensive chronic kidney disease with stage 5 chronic kidney disease or end stage renal disease: Secondary | ICD-10-CM | POA: Diagnosis present

## 2021-04-22 LAB — URINALYSIS, ROUTINE W REFLEX MICROSCOPIC
Bilirubin Urine: NEGATIVE
Glucose, UA: >=500 mg/dL — AB
Ketones, ur: 5 mg/dL — AB
Nitrite: NEGATIVE
Protein, ur: 100 mg/dL — AB
Specific Gravity, Urine: 1.01 (ref 1.005–1.030)
pH: 6 (ref 5.0–8.0)

## 2021-04-22 LAB — COMPREHENSIVE METABOLIC PANEL
ALT: 19 U/L (ref 0–44)
AST: 19 U/L (ref 15–41)
Albumin: 4 g/dL (ref 3.5–5.0)
Alkaline Phosphatase: 59 U/L (ref 38–126)
Anion gap: 23 — ABNORMAL HIGH (ref 5–15)
BUN: 73 mg/dL — ABNORMAL HIGH (ref 6–20)
CO2: 14 mmol/L — ABNORMAL LOW (ref 22–32)
Calcium: 10.6 mg/dL — ABNORMAL HIGH (ref 8.9–10.3)
Chloride: 101 mmol/L (ref 98–111)
Creatinine, Ser: 10.95 mg/dL — ABNORMAL HIGH (ref 0.44–1.00)
GFR, Estimated: 4 mL/min — ABNORMAL LOW (ref >60)
Glucose, Bld: 219 mg/dL — ABNORMAL HIGH (ref 70–99)
Potassium: 3.5 mmol/L (ref 3.5–5.1)
Sodium: 138 mmol/L (ref 135–145)
Total Bilirubin: 0.9 mg/dL (ref 0.3–1.2)
Total Protein: 7.9 g/dL (ref 6.5–8.1)

## 2021-04-22 LAB — CBC WITH DIFFERENTIAL/PLATELET
Abs Immature Granulocytes: 0.02 10*3/uL (ref 0.00–0.07)
Basophils Absolute: 0 10*3/uL (ref 0.0–0.1)
Basophils Relative: 1 %
Eosinophils Absolute: 0 10*3/uL (ref 0.0–0.5)
Eosinophils Relative: 0 %
HCT: 41.7 % (ref 36.0–46.0)
Hemoglobin: 13.7 g/dL (ref 12.0–15.0)
Immature Granulocytes: 0 %
Lymphocytes Relative: 13 %
Lymphs Abs: 1 10*3/uL (ref 0.7–4.0)
MCH: 28.4 pg (ref 26.0–34.0)
MCHC: 32.9 g/dL (ref 30.0–36.0)
MCV: 86.5 fL (ref 80.0–100.0)
Monocytes Absolute: 0.1 10*3/uL (ref 0.1–1.0)
Monocytes Relative: 2 %
Neutro Abs: 6.7 10*3/uL (ref 1.7–7.7)
Neutrophils Relative %: 84 %
Platelets: 515 10*3/uL — ABNORMAL HIGH (ref 150–400)
RBC: 4.82 MIL/uL (ref 3.87–5.11)
RDW: 14.3 % (ref 11.5–15.5)
WBC: 7.9 10*3/uL (ref 4.0–10.5)
nRBC: 0 % (ref 0.0–0.2)

## 2021-04-22 LAB — RESP PANEL BY RT-PCR (FLU A&B, COVID) ARPGX2
Influenza A by PCR: NEGATIVE
Influenza B by PCR: NEGATIVE
SARS Coronavirus 2 by RT PCR: NEGATIVE

## 2021-04-22 LAB — LIPASE, BLOOD: Lipase: 69 U/L — ABNORMAL HIGH (ref 11–51)

## 2021-04-22 LAB — CBG MONITORING, ED
Glucose-Capillary: 158 mg/dL — ABNORMAL HIGH (ref 70–99)
Glucose-Capillary: 190 mg/dL — ABNORMAL HIGH (ref 70–99)
Glucose-Capillary: 191 mg/dL — ABNORMAL HIGH (ref 70–99)

## 2021-04-22 LAB — BETA-HYDROXYBUTYRIC ACID: Beta-Hydroxybutyric Acid: 3.39 mmol/L — ABNORMAL HIGH (ref 0.05–0.27)

## 2021-04-22 LAB — MAGNESIUM: Magnesium: 2.7 mg/dL — ABNORMAL HIGH (ref 1.7–2.4)

## 2021-04-22 MED ORDER — LACTATED RINGERS IV SOLN: INTRAVENOUS | Status: DC

## 2021-04-22 MED ORDER — INSULIN REGULAR(HUMAN) IN NACL 100-0.9 UT/100ML-% IV SOLN
INTRAVENOUS | Status: DC
  Administered 2021-04-22: 1.5 [IU]/h via INTRAVENOUS
  Administered 2021-04-23: 2.2 [IU]/h via INTRAVENOUS
  Filled 2021-04-22: qty 100

## 2021-04-22 MED ORDER — POTASSIUM CHLORIDE 10 MEQ/100ML IV SOLN
10.0000 meq | INTRAVENOUS | Status: AC
  Administered 2021-04-22 – 2021-04-23 (×3): 10 meq via INTRAVENOUS
  Filled 2021-04-22 (×3): qty 100

## 2021-04-22 MED ORDER — DEXTROSE 50 % IV SOLN: 0.0000 mL | INTRAVENOUS | Status: DC | PRN

## 2021-04-22 MED ORDER — ACETAMINOPHEN 650 MG RE SUPP: 650.0000 mg | Freq: Four times a day (QID) | RECTAL | Status: DC | PRN

## 2021-04-22 MED ORDER — ACETAMINOPHEN 325 MG PO TABS
650.0000 mg | ORAL_TABLET | Freq: Four times a day (QID) | ORAL | Status: DC | PRN
  Administered 2021-04-26: 650 mg via ORAL
  Filled 2021-04-22: qty 2

## 2021-04-22 MED ORDER — DEXTROSE IN LACTATED RINGERS 5 % IV SOLN: INTRAVENOUS | Status: DC

## 2021-04-22 MED ORDER — METOCLOPRAMIDE HCL 5 MG PO TABS
5.0000 mg | ORAL_TABLET | Freq: Every day | ORAL | Status: DC | PRN
  Administered 2021-04-23: 5 mg via ORAL
  Filled 2021-04-22: qty 1

## 2021-04-22 MED ORDER — HEPARIN SODIUM (PORCINE) 5000 UNIT/ML IJ SOLN
5000.0000 [IU] | Freq: Three times a day (TID) | INTRAMUSCULAR | Status: DC
  Administered 2021-04-22 – 2021-04-26 (×11): 5000 [IU] via SUBCUTANEOUS
  Filled 2021-04-22 (×10): qty 1

## 2021-04-22 MED ORDER — LACTATED RINGERS IV BOLUS
250.0000 mL | Freq: Once | INTRAVENOUS | Status: AC
  Administered 2021-04-22: 250 mL via INTRAVENOUS

## 2021-04-22 MED ORDER — METOCLOPRAMIDE HCL 5 MG/ML IJ SOLN
5.0000 mg | Freq: Two times a day (BID) | INTRAMUSCULAR | Status: AC
  Administered 2021-04-22 – 2021-04-23 (×2): 5 mg via INTRAVENOUS
  Filled 2021-04-22 (×2): qty 2

## 2021-04-22 MED ORDER — METOCLOPRAMIDE HCL 5 MG/ML IJ SOLN
10.0000 mg | Freq: Once | INTRAMUSCULAR | Status: AC
  Administered 2021-04-22: 10 mg via INTRAVENOUS
  Filled 2021-04-22: qty 2

## 2021-04-22 NOTE — ED Notes (Signed)
Attempted IV x3, unsuccessful. EDP notified.  ?

## 2021-04-22 NOTE — ED Triage Notes (Signed)
BIB [facility] from home for NVx5Dx2, onset yesterday, no known fever, CAPD daily, last PD last night, CBG 195, VSS, no access, EDP into room upon arrival.  ?

## 2021-04-22 NOTE — ED Provider Notes (Signed)
?[facility] ?Provider Note ? ? ?CSN: [id number] ?Arrival date & time:    ? ?  ? ?History ? ?Chief Complaint  ?Patient presents with  ? Emesis  ? ? ?Barbara Donovan is a 31 y.o. female. ? ?HPI ?Patient presents for nausea vomiting since yesterday.  Her medical history includes T1DM, HTN, HLD, asthma, gastroparesis, ESRD (on PD), anemia, anxiety, depression.  She was able to tolerate her home medications last night.  She was not able to tolerate them this morning. She denies any areas of pain.  Current symptoms are reminiscent of prior episodes of gastroparesis. She attempted to take her Reglan at home but had subsequent vomiting.  Patient undergoes peritoneal dialysis at home.  She performs this at night, including last night.  This morning, she went to the dialysis center to have lab work done.  Sugar checks at home and at dialysis center were in the range of 200.  Patient took 20 units of long-acting insulin this morning.  She has not taken any short acting insulin today.  She does still make urine. ?  ? ?Home Medications ?Prior to Admission medications   ?Medication Sig Start Date End Date Taking? Authorizing Provider  ?amLODipine (NORVASC) 10 MG tablet Take 1 tablet (10 mg total) by mouth daily. ?Patient taking differently: Take 10 mg by mouth at bedtime. 03/09/20   Mercy Riding, MD  ?brimonidine (ALPHAGAN) 0.2 % ophthalmic solution Place 1 drop into the left eye 3 (three) times daily. 03/09/21   [provider]  ?calcitRIOL (ROCALTROL) 0.5 MCG capsule Take 0.5 mcg by mouth daily. 12/02/20   [provider]  ?carvedilol (COREG) 25 MG tablet Take 1 tablet (25 mg total) by mouth 2 (two) times daily. 03/07/19   Imogene Burn, PA-C  ?Continuous Blood Gluc Receiver DEVI 1 Units by Does not apply route 4 (four) times daily -  before meals and at bedtime. May substitute for cheapest monitor with insurance 03/27/21   Scarlett Presto, MD  ?Continuous Blood Gluc  Sensor MISC 1 each by Does not apply route as directed. Use as directed every 14 days. May dispense FreeStyle [employer] or similar. 01/27/20   Antonieta Pert, MD  ?Continuous Glucose Monitor Sup MISC 1 Units by Does not apply route 4 (four) times daily -  before meals and at bedtime. May substitute for cheapest option with patient insurance 03/27/21 03/27/22  Scarlett Presto, MD  ?FLUoxetine (PROZAC) 20 MG capsule Take 20 mg by mouth daily. 03/16/21   [provider]  ?glucose blood (FREESTYLE TEST STRIPS) test strip Use as instructed 02/25/21   Terrilee Croak, MD  ?glucose blood (ONETOUCH VERIO) test strip 1 each by Other route 3 (three) times daily. And lancets 3/day 11/28/20   Renato Shin, MD  ?hydrALAZINE (APRESOLINE) 50 MG tablet Take 1 tablet (50 mg total) by mouth 3 (three) times daily. ?Patient taking differently: Take 50 mg by mouth in the morning and at bedtime. 12/31/20   Domenic Polite, MD  ?insulin glargine (LANTUS SOLOSTAR) 100 UNIT/ML Solostar Pen Inject 20 Units into the skin every morning. 02/05/21   Bonnielee Haff, MD  ?insulin lispro (HUMALOG KWIKPEN) 100 UNIT/ML KwikPen 3 times a day (just before each meal) 4-3-3 units, and pen needles 4/day 04/21/21   Renato Shin, MD  ?metoCLOPramide (REGLAN) 5 MG tablet Take 1 tablet (5 mg total) by mouth 3 (three) times daily before meals. 02/23/21   Sherwood Gambler, MD  ?multivitamin (RENA-VIT) TABS tablet  Take 1 tablet by mouth at bedtime. 03/08/20   Mercy Riding, MD  ?ondansetron (ZOFRAN-ODT) 4 MG disintegrating tablet Take 1 tablet (4 mg total) by mouth every 6 (six) hours as needed for nausea or vomiting. 04/07/21   Scarlett Presto, MD  ?promethazine (PHENERGAN) 25 MG suppository Place 1 suppository (25 mg total) rectally every 6 (six) hours as needed for nausea or vomiting. 02/19/21   Margarita Mail, PA-C  ?sevelamer carbonate (RENVELA) 800 MG tablet Take 1 tablet (800 mg total) by mouth 3 (three) times daily with meals. 02/05/21   Bonnielee Haff, MD  ?    ? ?Allergies    ?Patient has no known allergies.   ? ?Review of Systems   ?Review of Systems  ?Gastrointestinal:  Positive for nausea and vomiting.  ?All other systems reviewed and are negative. ? ?Physical Exam ?Updated Vital Signs ?BP (!) 145/97   Pulse 89   Temp 98.5 ?F (36.9 ?C) (Oral)   Resp 15   SpO2 99%  ?Physical Exam ?Vitals and nursing note reviewed.  ?Constitutional:   ?   General: She is not in acute distress. ?   Appearance: She is well-developed. She is ill-appearing. She is not toxic-appearing or diaphoretic.  ?HENT:  ?   Head: Normocephalic and atraumatic.  ?   Right Ear: External ear normal.  ?   Left Ear: External ear normal.  ?   Nose: Nose normal.  ?   Mouth/Throat:  ?   Mouth: Mucous membranes are moist.  ?   Pharynx: Oropharynx is clear.  ?Eyes:  ?   Extraocular Movements: Extraocular movements intact.  ?   Conjunctiva/sclera: Conjunctivae normal.  ?Cardiovascular:  ?   Rate and Rhythm: Regular rhythm. Tachycardia present.  ?   Heart sounds: No murmur heard. ?Pulmonary:  ?   Effort: Pulmonary effort is normal. No respiratory distress.  ?   Breath sounds: Normal breath sounds. No wheezing or rales.  ?Chest:  ?   Chest wall: No tenderness.  ?Abdominal:  ?   Palpations: Abdomen is soft.  ?   Tenderness: There is no abdominal tenderness.  ?   Comments: PD catheter in place  ?Musculoskeletal:     ?   General: No swelling. Normal range of motion.  ?   Cervical back: Normal range of motion and neck supple.  ?   Right lower leg: No edema.  ?   Left lower leg: No edema.  ?Skin: ?   General: Skin is warm and dry.  ?   Capillary Refill: Capillary refill takes less than 2 seconds.  ?   Coloration: Skin is not jaundiced or pale.  ?Neurological:  ?   General: No focal deficit present.  ?   Mental Status: She is alert and oriented to person, place, and time.  ?   Cranial Nerves: No cranial nerve deficit.  ?   Sensory: No sensory deficit.  ?   Motor: No weakness.  ?   Coordination: Coordination normal.   ?Psychiatric:     ?   Mood and Affect: Mood normal.     ?   Behavior: Behavior normal.     ?   Thought Content: Thought content normal.     ?   Judgment: Judgment normal.  ? ? ?ED Results / Procedures / Treatments   ?Labs ?(all labs ordered are listed, but only abnormal results are displayed) ?Labs Reviewed  ?COMPREHENSIVE METABOLIC PANEL - Abnormal; Notable for the following components:  ?  Result Value  ? CO2 14 (*)   ? Glucose, Bld 219 (*)   ? BUN 73 (*)   ? Creatinine, Ser 10.95 (*)   ? Calcium 10.6 (*)   ? GFR, Estimated 4 (*)   ? Anion gap 23 (*)   ? All other components within normal limits  ?LIPASE, BLOOD - Abnormal; Notable for the following components:  ? Lipase 69 (*)   ? All other components within normal limits  ?CBC WITH DIFFERENTIAL/PLATELET - Abnormal; Notable for the following components:  ? Platelets 515 (*)   ? All other components within normal limits  ?URINALYSIS, ROUTINE W REFLEX MICROSCOPIC - Abnormal; Notable for the following components:  ? APPearance CLOUDY (*)   ? Glucose, UA >=500 (*)   ? Hgb urine dipstick SMALL (*)   ? Ketones, ur 5 (*)   ? Protein, ur 100 (*)   ? Leukocytes,Ua TRACE (*)   ? Bacteria, UA RARE (*)   ? All other components within normal limits  ?MAGNESIUM - Abnormal; Notable for the following components:  ? Magnesium 2.7 (*)   ? All other components within normal limits  ?BETA-HYDROXYBUTYRIC ACID - Abnormal; Notable for the following components:  ? Beta-Hydroxybutyric Acid 3.39 (*)   ? All other components within normal limits  ?BASIC METABOLIC PANEL - Abnormal; Notable for the following components:  ? Potassium 3.1 (*)   ? CO2 16 (*)   ? Glucose, Bld 200 (*)   ? BUN 69 (*)   ? Creatinine, Ser 10.95 (*)   ? GFR, Estimated 4 (*)   ? Anion gap 20 (*)   ? All other components within normal limits  ?CBG MONITORING, ED - Abnormal; Notable for the following components:  ? Glucose-Capillary 191 (*)   ? All other components within normal limits  ?CBG MONITORING, ED - Abnormal;  Notable for the following components:  ? Glucose-Capillary 158 (*)   ? All other components within normal limits  ?CBG MONITORING, ED - Abnormal; Notable for the following components:  ? Glucose-Capillary 190

## 2021-04-22 NOTE — H&P (Addendum)
? ? ? ?Date: 04/22/2021     ?     ?     ?Patient Name:  Barbara Donovan MRN: [id number]  ?DOB: [birth year] Age / Sex: 31 y.o., female   ?PCP: Scarlett Presto, MD    ?     ?Medical Service: Internal Medicine Teaching Service    ?     ?Attending Physician: Dr. Charise Killian, MD    ?First Contact: Dr. Johnney Ou DO Pager: [phone]  ?Second Contact: Dr. Howie Ill DO Pager: [phone]  ?     ?After Hours (After 5p/  First Contact Pager: [phone]  ?weekends / holidays): Second Contact Pager: [phone]  ? ?Chief Complaint: Nausea/vomiting ? 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  Patient states that her nausea/vomiting began 1 day ago.  A total of 5 bouts of nonbloody nonbilious emesis. No changes in diet or medications.  No known triggers for nausea/vomiting other than history of gastroparesis.  Patient endorses associated diarrhea, 1 episode of nonbloody diarrhea.  Patient denies any sick contacts.  Denies fever, chills, cough, lightheadedness, dizziness, chest pain, shortness of breath, abdominal pain, weakness.  Patient states that this is a chronic condition and takes home medication Reglan for relief. Patient was seen yesterday in office by endocrinologist complaining of similar symptoms.  No medication changes at that time.  Patient was educated on the prognosis for gastroparesis and associated chronic nausea and vomiting.  Patient states she is compliant with her daily medications including insulin.  Last insulin dose, this morning, 20 units of Lantus.  Patient has CGM.  Glucose range: lowest 67 (1 week ago) and highest 240 (once) otherwise states her glucose range has been at goal.  Last PD yesterday, completed 10 hours duration,  denies any issues with this.  ? ? ?Meds: ?Amlodipine 10 mg daily ?Carvedilol 25 mg twice daily ?Calcitriol daily ?Fluoxetine 20 mg daily ?Hydralazine 50 mg 3 times daily ?Lantus 20 units daily ?Metoclopramide 5 mg as needed ? ? ? ?Allergies: ?Allergies as of 04/22/2021  ? (No Known Allergies)  ? ?Past Medical History:  ?Diagnosis Date  ? Asthma   ? as a child, no problems as an adult, no inhaler  ? Cataract   ? NS OU  ? Chronic hypertension during pregnancy, antepartum 08/19/2017  ? Dehydration 01/28/2018  ? Depression during pregnancy, antepartum 07/07/2017  ? 6/20: Short trial of zoloft previously, reports didn't help much but also didn't give it a chance Discussed r/b/a SSRIs in pregnancy, agrees to try Zoloft again, rx sent No SI/HI/red flags  ? Diabetes ([place])   ? TYPE I. A1C 7.5% 05/31/20  ? Diabetic retinopathy ([place]) 06/09/2017  ? 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  ? ESRD on peritoneal dialysis [facility])   ? HTN (hypertension)   ? Hypertensive retinopathy   ? OU  ? Hypokalemia 01/22/2018  ? Hypomagnesemia 01/28/2018  ? Intractable nausea and vomiting 01/22/2018  ? Intrauterine growth restriction (IUGR) affecting care of mother 12/22/2017  ? Morbid obesity ([place])   ? Nephropathy, diabetic ([place]) 12/29/2017  ? Severe hyperemesis gravidarum 10/30/2017  ? Type I diabetes mellitus ([place]) 07/07/2017  ? Current Diabetic Medications:  Insulin  [x]  Aspirin 81 mg daily after 12 weeks (? A2/B GDM)  Required Referrals for A1GDM or A2GDM: [x]  Diabetes Education and Testing Supplies [x]  Nutrition Cousult  For A2/B GDM or higher classes of DM [x]  Diabetes Education and Testing Supplies [x]  Nutrition Counsult [x]  Fetal ECHO after 20 weeks  [x]  Eye exam for retina evaluation - severe retinopathy 7/19  Base  ? Ventricular septal defect (VSD) of fetus in singleton pregnancy, antepartum 09/30/2017  ? May go to newborn nursery per Dr. Lenard Simmer Echo prior to discharge  ? ? ?Family History:  Family history of hypertension and type 2 diabetes ? ?Social History: Lives with boyfriend and 59-year-old daughter.  Denies tobacco use, alcohol use or illicit drug use. ? ?Review of Systems: ?A complete ROS was negative except as per HPI.  ? ?Physical Exam: ?Blood pressure (!) 145/97, pulse 89, temperature 98.5 ?F (36.9 ?C), temperature source Oral, resp. rate 15, SpO2 99 %. ?Physical Exam ?Constitutional:   ?   General: She is awake.  ?   Appearance: She is normal weight.  ?HENT:  ?   Head: Normocephalic and atraumatic.  ?Cardiovascular:  ?   Rate and Rhythm: Tachycardia present.  ?   Heart sounds: Normal heart sounds.  ?Pulmonary:  ?   Effort: Pulmonary effort is normal.  ?   Breath sounds: Normal breath sounds.  ?Abdominal:  ?   General: Abdomen is flat. Bowel sounds are normal.  ?   Palpations: Abdomen is soft.  ?   Tenderness: There is no abdominal tenderness.  ?   Comments: PD port LUQ, clean and dry; no sign of infection  ?Musculoskeletal:  ?   Right lower leg: No edema.  ?   Left lower leg: No edema.  ?Skin: ?   General: Skin is warm and dry.  ?Neurological:  ?   General: No focal deficit present.  ?   Mental Status: She is oriented to person, place, and time. She is lethargic.  ?Psychiatric:     ?   Behavior: Behavior normal. Behavior is cooperative.     ?   Cognition and Memory: Cognition normal.  ? ? ? ?EKG: personally reviewed my interpretation is sinus rhythm, borderline QT prolongation ? ?CXR: personally reviewed my interpretation is N/A ? ?Assessment & Plan by Problem: ?Principal Problem: ?  Nausea and vomiting ?Active Problems: ?  Diabetic retinopathy ([place]) ?  Chronic hypertension ?  ESRD (end stage renal disease) on PD ?  Gastroparesis ? ?Nausea and vomiting 2/2 gastroparesis ?Patient has history of chronic nausea/vomiting. Most recent admission April 05, 2021 with similar symptoms.   Patient was seen yesterday by endocrinologist with similar complaint.  Home regimen includes Reglan 5 mg as needed  for nausea vomiting relief.  Patient endorses a total of 5 bouts of nonbloody nonbilious emesis 1 day ago.  Denies any symptoms of infection including no fever or chills; no sick contacts.  Vitals on admission stable: Afebrile, no hypotension noted, though patient was tachycardic, likely in the setting of volume loss.  Low suspicion for infectious process.  On exam, abdomen soft and nontender.  Cesarean section in 0623 without complications; no other abdominal surgeries.  Low suspicion for peritonitis.  Nausea/vomiting likely associated with gastroparesis.  Given history of longstanding type 1 diabetes, which is a possible sequela of the disease.  Though, Gastric emptying studies obtained April 2022 revealed normal gastric emptying study.  Consider repeating gastric emptying study.  Furthermore, symptoms improved with administration of Reglan. Patient follows endocrinologist Dr. Renato Shin.  No recent changes in medications. ?--Reglan 5 mg  x 2 doses scheduled ?--After scheduled doses are complete--> Reglan 5 mg as needed not to exceed 40 mg daily ?--EKG normal sinus rhythm, borderline QT prolongation--> consider repeat EKG if concerned for prolongation ?-- Repeat gastric emptying study ? ?DKA ?Patient presents with 1 day history of nausea vomiting as stated above.  On admission, glucose 219; beta hydroxybutyrate acid elevated at 3.25; metabolic acidosis with anion gap of 23.  Potassium within normal limits at 3.5.  Patient's last dose of insulin was this morning, 20 units of Lantus.  In the ED, insulin drip started; IV fluids initiated and potassium chloride given. ?--Continue treatment until anion gap is closed ?--Once ion gap is closed--> transition to insulin regimen ?--Once off insulin drip, advance diet as tolerated; add SSI ?--CBG every 4 hours ?--Obtain ABG in a.m. ? ?Type 1 diabetes complicated by gastroparesis and retinopathy ?Diagnosed with T1DM 20 years ago.  Most recent A1c 6.7% assessed 04/13/2021.   Patient on continuous glucose monitoring.  Endorsing her glucose ranges 170s.  Lowest glucose reading 67 1 week ago and highest 240 once.  Seldom lows.  Overall, endorses glucose readings at goal.  Home r

## 2021-04-22 NOTE — Hospital Course (Addendum)
3 ep of vomiting.  ? ?Usually last about two days.  ? ?No blood in stool.  ? ?NO fever or chills, HA or light headedness or dizziness.  ?BG in 170s on CGM, had one low earlier this week. Highest was 242.  ? ?Just one loose stool yesterday.  ?4u 3 3  ?Lantus 20u in the AM. ? ?No numbness or tingling.  ? ?One episode of vomiting. Since being in the hospital.  ? ?Feels like this is related to gastroparesis.  ? ?No changes in foods. No allergies.  ?NO CP SOB or abd, does not usually get abd pain with this.  ? ?PMH: ?ESRD on PD, completed 10 hours no issues with this.  ?HTN: Coreg hydralazine and amlodipine tried to take this AM but couldn't keep down, blood pressure usually elevated at home 160s/99 ?Calcitriol  ?T1DM c/b ESRD, gastroparesis ? ?SH: ?31 year old child ?Lives with SO ?Never smoker ?Occasional drinker ? ?C section 2019 ? ?FH ?T2DM ?HTN  ? ?Physical Exam  ?Constitutional: Well-developed, well-nourished, and in no distress.  ?HENT:  ?Head: Normocephalic and atraumatic.  ?Eyes: EOM are normal.  ?Neck: Normal range of motion.  ?Cardiovascular: Normal rate, regular rhythm, intact distal pulses. No gallop and no friction rub.  ?No murmur heard. No lower extremity edema  ?Pulmonary: Non labored breathing on room air, no wheezing or rales  ?Abdominal: Soft. Normal bowel sounds. Non distended and non tender ?Musculoskeletal: Normal range of motion.     ?   General: No tenderness or edema.  ?Neurological: Alert and oriented to person, place, and time. Non focal  ?Skin: Skin is warm and dry.  ? ? ?

## 2021-04-22 NOTE — ED Notes (Signed)
EDP at BS 

## 2021-04-23 ENCOUNTER — Observation Stay (HOSPITAL_COMMUNITY): Payer: 59

## 2021-04-23 ENCOUNTER — Ambulatory Visit: Payer: Medicare Other | Admitting: Psychologist

## 2021-04-23 ENCOUNTER — Encounter (HOSPITAL_COMMUNITY): Payer: Self-pay | Admitting: Internal Medicine

## 2021-04-23 DIAGNOSIS — E785 Hyperlipidemia, unspecified: Secondary | ICD-10-CM | POA: Diagnosis present

## 2021-04-23 DIAGNOSIS — R112 Nausea with vomiting, unspecified: Secondary | ICD-10-CM | POA: Diagnosis not present

## 2021-04-23 DIAGNOSIS — I120 Hypertensive chronic kidney disease with stage 5 chronic kidney disease or end stage renal disease: Secondary | ICD-10-CM | POA: Diagnosis present

## 2021-04-23 DIAGNOSIS — M898X9 Other specified disorders of bone, unspecified site: Secondary | ICD-10-CM | POA: Diagnosis present

## 2021-04-23 DIAGNOSIS — Z823 Family history of stroke: Secondary | ICD-10-CM | POA: Diagnosis not present

## 2021-04-23 DIAGNOSIS — E1110 Type 2 diabetes mellitus with ketoacidosis without coma: Secondary | ICD-10-CM | POA: Diagnosis present

## 2021-04-23 DIAGNOSIS — E10319 Type 1 diabetes mellitus with unspecified diabetic retinopathy without macular edema: Secondary | ICD-10-CM | POA: Diagnosis present

## 2021-04-23 DIAGNOSIS — R1110 Vomiting, unspecified: Secondary | ICD-10-CM | POA: Diagnosis present

## 2021-04-23 DIAGNOSIS — Z992 Dependence on renal dialysis: Secondary | ICD-10-CM | POA: Diagnosis not present

## 2021-04-23 DIAGNOSIS — A419 Sepsis, unspecified organism: Secondary | ICD-10-CM | POA: Diagnosis present

## 2021-04-23 DIAGNOSIS — Z79899 Other long term (current) drug therapy: Secondary | ICD-10-CM | POA: Diagnosis not present

## 2021-04-23 DIAGNOSIS — Z833 Family history of diabetes mellitus: Secondary | ICD-10-CM | POA: Diagnosis not present

## 2021-04-23 DIAGNOSIS — E1022 Type 1 diabetes mellitus with diabetic chronic kidney disease: Secondary | ICD-10-CM | POA: Diagnosis present

## 2021-04-23 DIAGNOSIS — E1010 Type 1 diabetes mellitus with ketoacidosis without coma: Secondary | ICD-10-CM | POA: Diagnosis present

## 2021-04-23 DIAGNOSIS — F419 Anxiety disorder, unspecified: Secondary | ICD-10-CM | POA: Diagnosis present

## 2021-04-23 DIAGNOSIS — E1043 Type 1 diabetes mellitus with diabetic autonomic (poly)neuropathy: Secondary | ICD-10-CM | POA: Diagnosis not present

## 2021-04-23 DIAGNOSIS — E876 Hypokalemia: Secondary | ICD-10-CM | POA: Diagnosis present

## 2021-04-23 DIAGNOSIS — Z825 Family history of asthma and other chronic lower respiratory diseases: Secondary | ICD-10-CM | POA: Diagnosis not present

## 2021-04-23 DIAGNOSIS — K3184 Gastroparesis: Secondary | ICD-10-CM | POA: Diagnosis not present

## 2021-04-23 DIAGNOSIS — N186 End stage renal disease: Secondary | ICD-10-CM | POA: Diagnosis present

## 2021-04-23 DIAGNOSIS — Z794 Long term (current) use of insulin: Secondary | ICD-10-CM | POA: Diagnosis not present

## 2021-04-23 DIAGNOSIS — D631 Anemia in chronic kidney disease: Secondary | ICD-10-CM | POA: Diagnosis present

## 2021-04-23 DIAGNOSIS — E860 Dehydration: Secondary | ICD-10-CM | POA: Diagnosis not present

## 2021-04-23 DIAGNOSIS — E871 Hypo-osmolality and hyponatremia: Secondary | ICD-10-CM | POA: Diagnosis not present

## 2021-04-23 DIAGNOSIS — J45909 Unspecified asthma, uncomplicated: Secondary | ICD-10-CM | POA: Diagnosis present

## 2021-04-23 DIAGNOSIS — R0902 Hypoxemia: Secondary | ICD-10-CM | POA: Diagnosis present

## 2021-04-23 DIAGNOSIS — Z20822 Contact with and (suspected) exposure to covid-19: Secondary | ICD-10-CM | POA: Diagnosis not present

## 2021-04-23 DIAGNOSIS — Z8249 Family history of ischemic heart disease and other diseases of the circulatory system: Secondary | ICD-10-CM | POA: Diagnosis not present

## 2021-04-23 LAB — I-STAT ARTERIAL BLOOD GAS, ED
Acid-base deficit: 3 mmol/L — ABNORMAL HIGH (ref 0.0–2.0)
Bicarbonate: 22.2 mmol/L (ref 20.0–28.0)
Calcium, Ion: 1.27 mmol/L (ref 1.15–1.40)
HCT: 40 % (ref 36.0–46.0)
Hemoglobin: 13.6 g/dL (ref 12.0–15.0)
O2 Saturation: 99 %
Potassium: 3.1 mmol/L — ABNORMAL LOW (ref 3.5–5.1)
Sodium: 135 mmol/L (ref 135–145)
TCO2: 23 mmol/L (ref 22–32)
pCO2 arterial: 37.5 mmHg (ref 32–48)
pH, Arterial: 7.381 (ref 7.35–7.45)
pO2, Arterial: 146 mmHg — ABNORMAL HIGH (ref 83–108)

## 2021-04-23 LAB — RAPID URINE DRUG SCREEN, HOSP PERFORMED
Amphetamines: NOT DETECTED
Barbiturates: NOT DETECTED
Benzodiazepines: NOT DETECTED
Cocaine: NOT DETECTED
Opiates: NOT DETECTED
Tetrahydrocannabinol: NOT DETECTED

## 2021-04-23 LAB — BASIC METABOLIC PANEL
Anion gap: 13 (ref 5–15)
Anion gap: 13 (ref 5–15)
Anion gap: 18 — ABNORMAL HIGH (ref 5–15)
Anion gap: 20 — ABNORMAL HIGH (ref 5–15)
Anion gap: 20 — ABNORMAL HIGH (ref 5–15)
BUN: 69 mg/dL — ABNORMAL HIGH (ref 6–20)
BUN: 69 mg/dL — ABNORMAL HIGH (ref 6–20)
BUN: 70 mg/dL — ABNORMAL HIGH (ref 6–20)
BUN: 70 mg/dL — ABNORMAL HIGH (ref 6–20)
BUN: 70 mg/dL — ABNORMAL HIGH (ref 6–20)
CO2: 16 mmol/L — ABNORMAL LOW (ref 22–32)
CO2: 18 mmol/L — ABNORMAL LOW (ref 22–32)
CO2: 18 mmol/L — ABNORMAL LOW (ref 22–32)
CO2: 19 mmol/L — ABNORMAL LOW (ref 22–32)
CO2: 19 mmol/L — ABNORMAL LOW (ref 22–32)
Calcium: 10.2 mg/dL (ref 8.9–10.3)
Calcium: 10.7 mg/dL — ABNORMAL HIGH (ref 8.9–10.3)
Calcium: 9.6 mg/dL (ref 8.9–10.3)
Calcium: 9.7 mg/dL (ref 8.9–10.3)
Calcium: 9.8 mg/dL (ref 8.9–10.3)
Chloride: 100 mmol/L (ref 98–111)
Chloride: 102 mmol/L (ref 98–111)
Chloride: 105 mmol/L (ref 98–111)
Chloride: 99 mmol/L (ref 98–111)
Chloride: 99 mmol/L (ref 98–111)
Creatinine, Ser: 10.86 mg/dL — ABNORMAL HIGH (ref 0.44–1.00)
Creatinine, Ser: 10.94 mg/dL — ABNORMAL HIGH (ref 0.44–1.00)
Creatinine, Ser: 10.95 mg/dL — ABNORMAL HIGH (ref 0.44–1.00)
Creatinine, Ser: 11.03 mg/dL — ABNORMAL HIGH (ref 0.44–1.00)
Creatinine, Ser: 11.13 mg/dL — ABNORMAL HIGH (ref 0.44–1.00)
GFR, Estimated: 4 mL/min — ABNORMAL LOW (ref >60)
GFR, Estimated: 4 mL/min — ABNORMAL LOW (ref >60)
GFR, Estimated: 4 mL/min — ABNORMAL LOW (ref >60)
GFR, Estimated: 4 mL/min — ABNORMAL LOW (ref >60)
GFR, Estimated: 4 mL/min — ABNORMAL LOW (ref >60)
Glucose, Bld: 133 mg/dL — ABNORMAL HIGH (ref 70–99)
Glucose, Bld: 159 mg/dL — ABNORMAL HIGH (ref 70–99)
Glucose, Bld: 168 mg/dL — ABNORMAL HIGH (ref 70–99)
Glucose, Bld: 199 mg/dL — ABNORMAL HIGH (ref 70–99)
Glucose, Bld: 200 mg/dL — ABNORMAL HIGH (ref 70–99)
Potassium: 2.9 mmol/L — ABNORMAL LOW (ref 3.5–5.1)
Potassium: 3.1 mmol/L — ABNORMAL LOW (ref 3.5–5.1)
Potassium: 3.3 mmol/L — ABNORMAL LOW (ref 3.5–5.1)
Potassium: 3.5 mmol/L (ref 3.5–5.1)
Potassium: 4.3 mmol/L (ref 3.5–5.1)
Sodium: 134 mmol/L — ABNORMAL LOW (ref 135–145)
Sodium: 135 mmol/L (ref 135–145)
Sodium: 136 mmol/L (ref 135–145)
Sodium: 137 mmol/L (ref 135–145)
Sodium: 137 mmol/L (ref 135–145)

## 2021-04-23 LAB — CBC
HCT: 36.8 % (ref 36.0–46.0)
Hemoglobin: 11.9 g/dL — ABNORMAL LOW (ref 12.0–15.0)
MCH: 28.7 pg (ref 26.0–34.0)
MCHC: 32.3 g/dL (ref 30.0–36.0)
MCV: 88.7 fL (ref 80.0–100.0)
Platelets: 435 10*3/uL — ABNORMAL HIGH (ref 150–400)
RBC: 4.15 MIL/uL (ref 3.87–5.11)
RDW: 14.6 % (ref 11.5–15.5)
WBC: 8.8 10*3/uL (ref 4.0–10.5)
nRBC: 0 % (ref 0.0–0.2)

## 2021-04-23 LAB — I-STAT VENOUS BLOOD GAS, ED
Acid-base deficit: 5 mmol/L — ABNORMAL HIGH (ref 0.0–2.0)
Bicarbonate: 15.3 mmol/L — ABNORMAL LOW (ref 20.0–28.0)
Calcium, Ion: 1.13 mmol/L — ABNORMAL LOW (ref 1.15–1.40)
HCT: 43 % (ref 36.0–46.0)
Hemoglobin: 14.6 g/dL (ref 12.0–15.0)
O2 Saturation: 92 %
Potassium: 3.5 mmol/L (ref 3.5–5.1)
Sodium: 135 mmol/L (ref 135–145)
TCO2: 16 mmol/L — ABNORMAL LOW (ref 22–32)
pCO2, Ven: 19.6 mmHg — CL (ref 44–60)
pH, Ven: 7.501 — ABNORMAL HIGH (ref 7.25–7.43)
pO2, Ven: 55 mmHg — ABNORMAL HIGH (ref 32–45)

## 2021-04-23 LAB — GLUCOSE, CAPILLARY
Glucose-Capillary: 188 mg/dL — ABNORMAL HIGH (ref 70–99)
Glucose-Capillary: 154 mg/dL — ABNORMAL HIGH (ref 70–99)
Glucose-Capillary: 122 mg/dL — ABNORMAL HIGH (ref 70–99)
Glucose-Capillary: 156 mg/dL — ABNORMAL HIGH (ref 70–99)
Glucose-Capillary: 166 mg/dL — ABNORMAL HIGH (ref 70–99)
Glucose-Capillary: 215 mg/dL — ABNORMAL HIGH (ref 70–99)
Glucose-Capillary: 193 mg/dL — ABNORMAL HIGH (ref 70–99)
Glucose-Capillary: 194 mg/dL — ABNORMAL HIGH (ref 70–99)
Glucose-Capillary: 199 mg/dL — ABNORMAL HIGH (ref 70–99)

## 2021-04-23 LAB — PHOSPHORUS: Phosphorus: 6.5 mg/dL — ABNORMAL HIGH (ref 2.5–4.6)

## 2021-04-23 LAB — CBG MONITORING, ED
Glucose-Capillary: 127 mg/dL — ABNORMAL HIGH (ref 70–99)
Glucose-Capillary: 146 mg/dL — ABNORMAL HIGH (ref 70–99)
Glucose-Capillary: 152 mg/dL — ABNORMAL HIGH (ref 70–99)
Glucose-Capillary: 154 mg/dL — ABNORMAL HIGH (ref 70–99)
Glucose-Capillary: 168 mg/dL — ABNORMAL HIGH (ref 70–99)
Glucose-Capillary: 176 mg/dL — ABNORMAL HIGH (ref 70–99)
Glucose-Capillary: 185 mg/dL — ABNORMAL HIGH (ref 70–99)
Glucose-Capillary: 195 mg/dL — ABNORMAL HIGH (ref 70–99)

## 2021-04-23 LAB — I-STAT BETA HCG BLOOD, ED (MC, WL, AP ONLY)
I-stat hCG, quantitative: <5 m[IU]/mL (ref <5)
I-stat hCG, quantitative: <5 m[IU]/mL (ref <5)

## 2021-04-23 LAB — BETA-HYDROXYBUTYRIC ACID: Beta-Hydroxybutyric Acid: 0.35 mmol/L — ABNORMAL HIGH (ref 0.05–0.27)

## 2021-04-23 LAB — LACTIC ACID, PLASMA
Lactic Acid, Venous: 1.7 mmol/L (ref 0.5–1.9)
Lactic Acid, Venous: 1.2 mmol/L (ref 0.5–1.9)

## 2021-04-23 IMAGING — DX DG CHEST 1V PORT
1 series · 1 of 1 positions shown · non-contrast
Comparison: Prior chest radiographs [DATE] and earlier.

CLINICAL DATA: Provided history: Hypoxia. History of asthma,
diabetes, hypertension.

EXAM:
PORTABLE CHEST 1 VIEW

[chest]
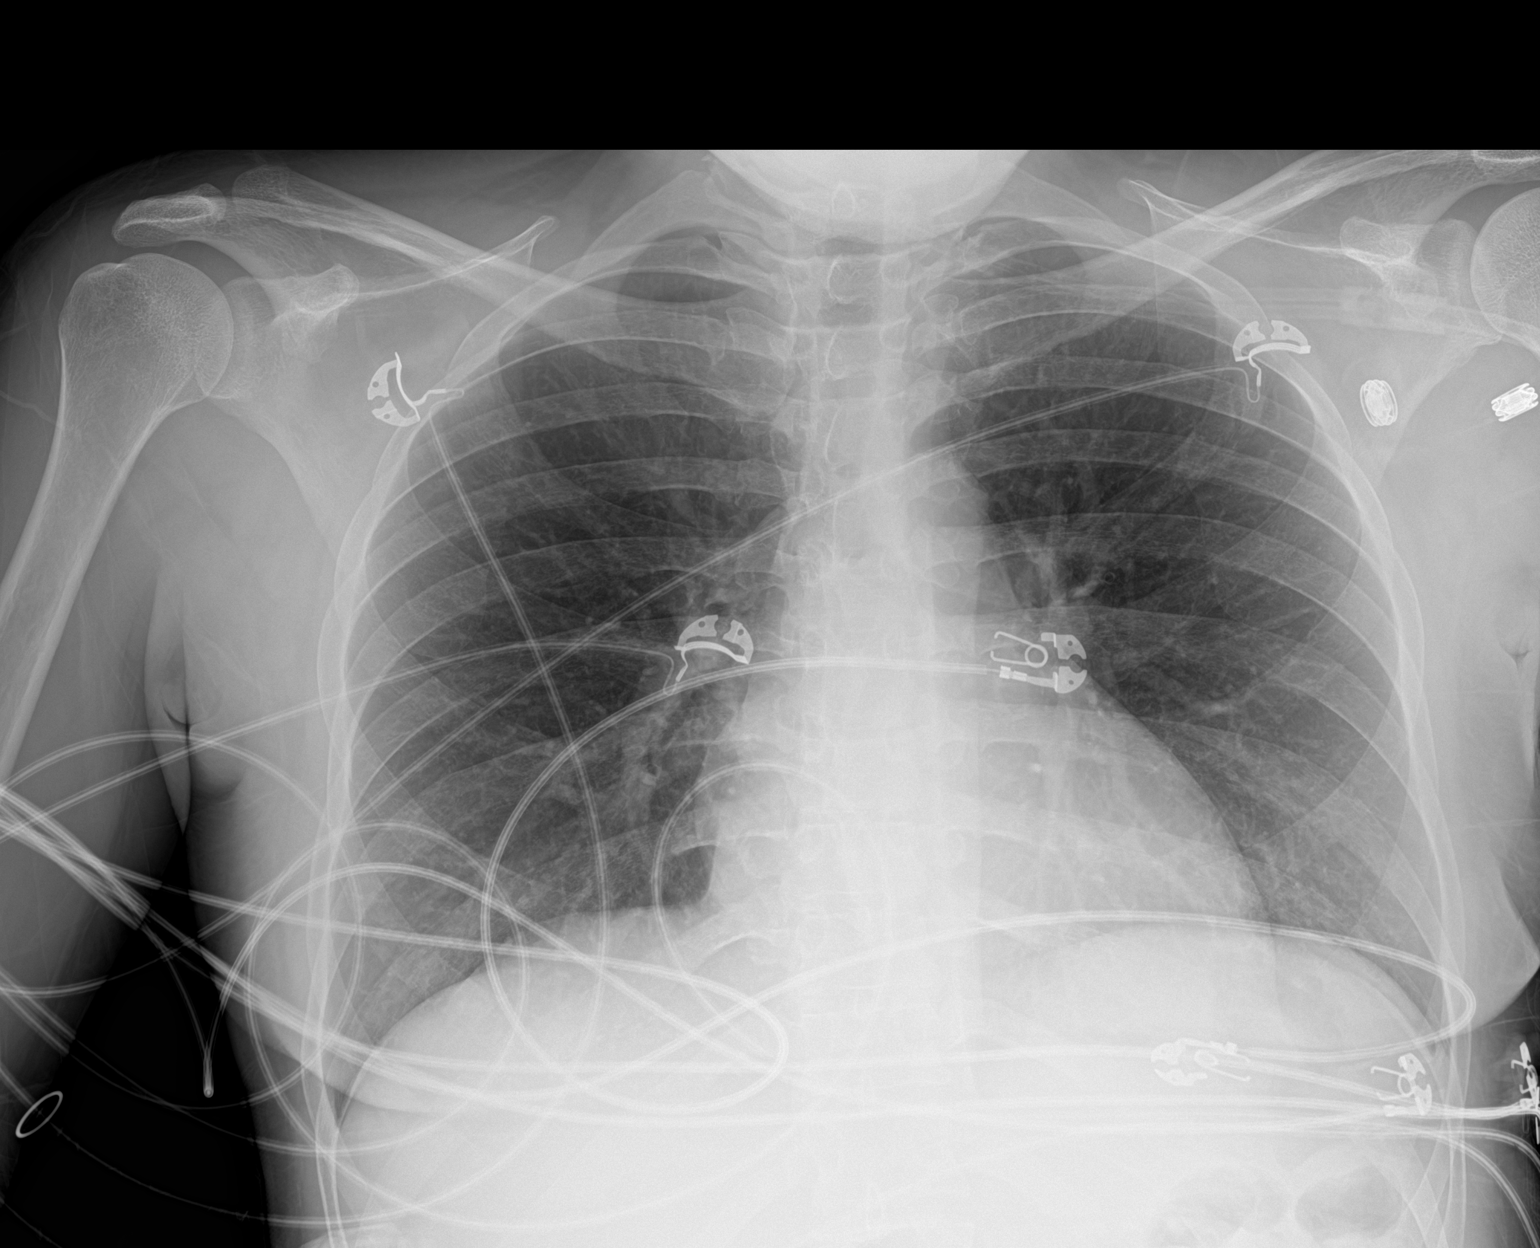

[1 of 1 positions shown; findings below may reference images not displayed]

FINDINGS: Heart size within normal limits. No appreciable airspace
consolidation. No evidence of pleural effusion or pneumothorax. No
acute bony abnormality identified.
IMPRESSION: No evidence of active cardiopulmonary disease.

## 2021-04-23 IMAGING — US US ABDOMEN COMPLETE
1 series · 14 of 25 positions shown · non-contrast
Comparison: None.

CLINICAL DATA: Elevated lipase

EXAM:
ABDOMEN ULTRASOUND COMPLETE

[Series 1: us abdomen complete · 14 of 91 slices shown]
[im 1/91]
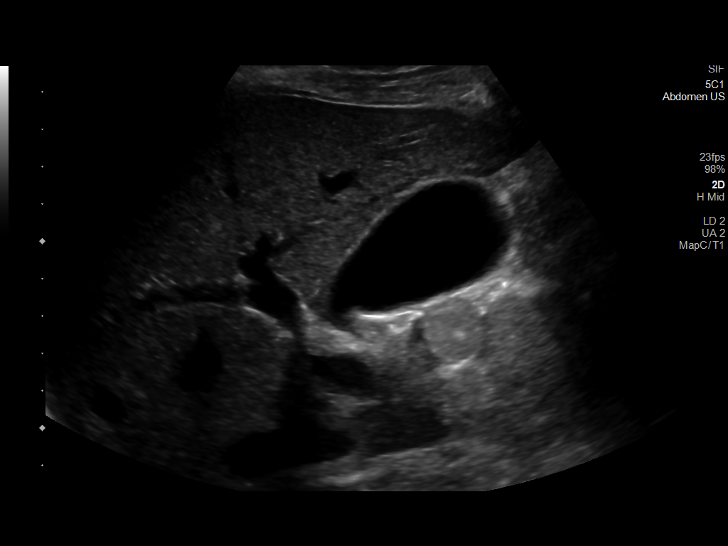
[im 8/91]
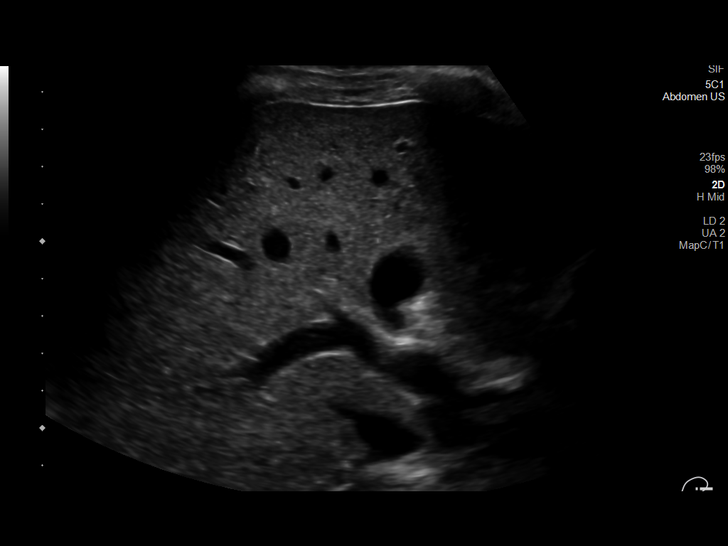
[im 16/91]
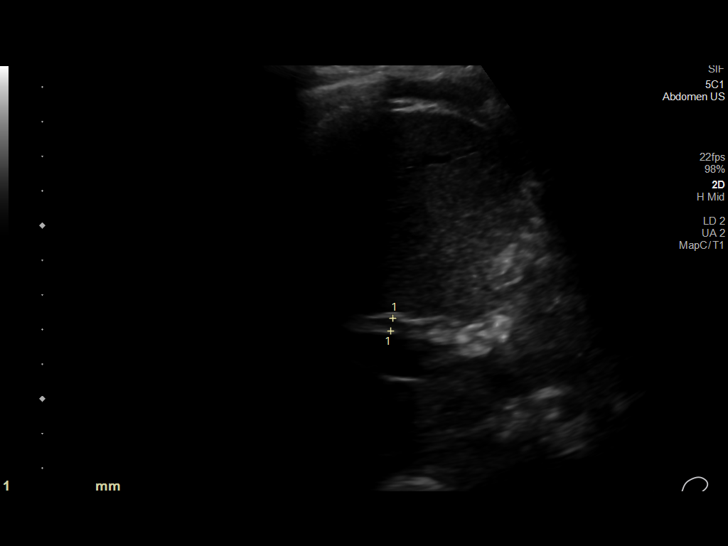
[im 23/91]
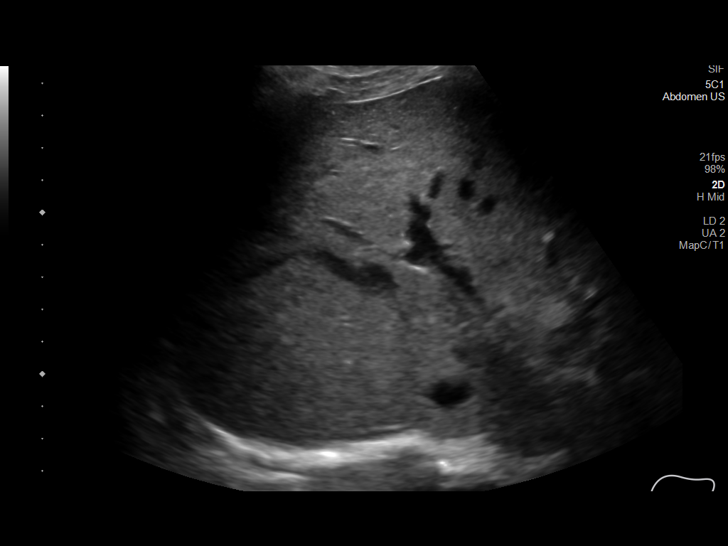
[im 31/91]
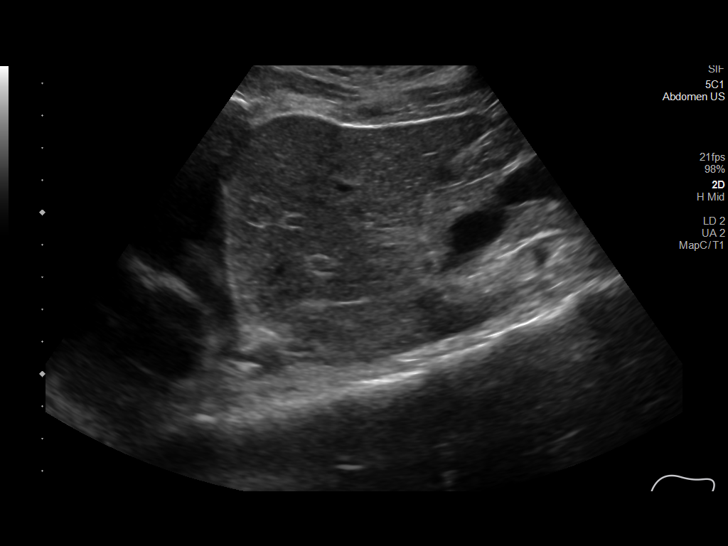
[im 34/91]
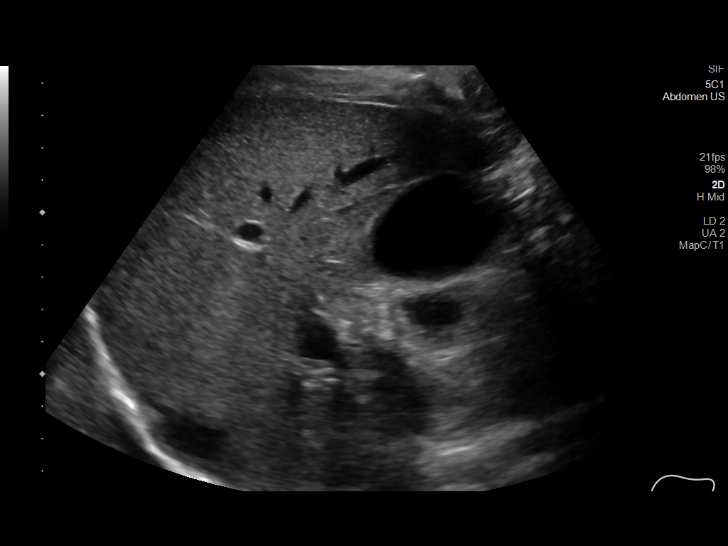
[im 42/91]
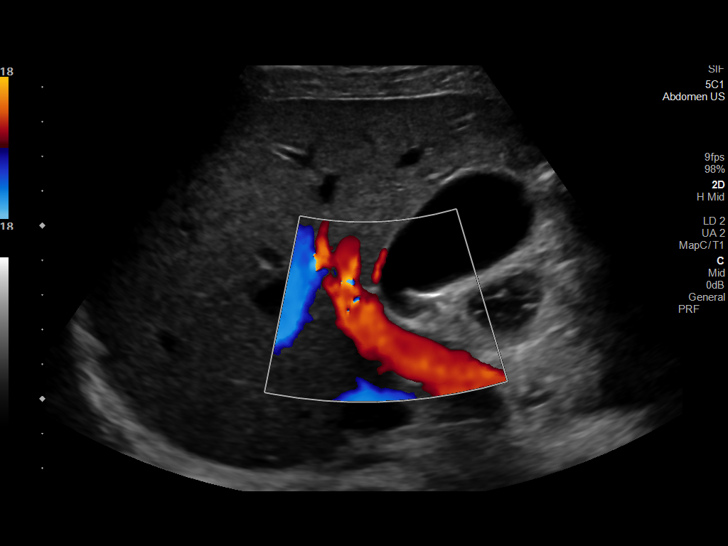
[im 49/91]
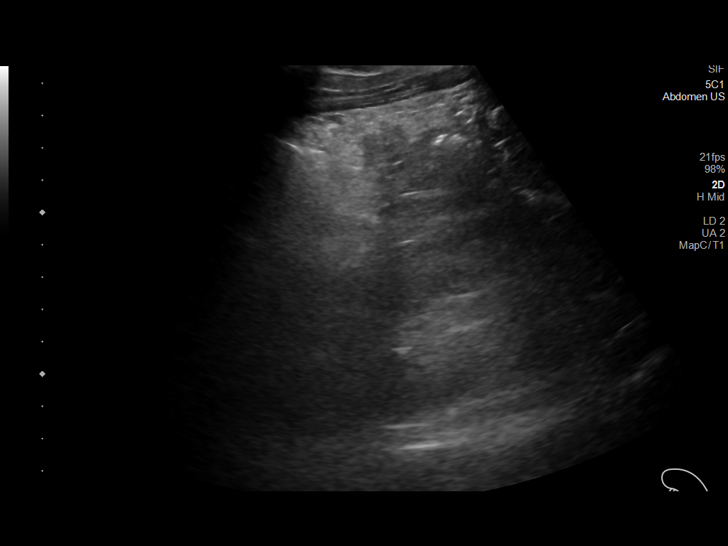
[im 57/91]
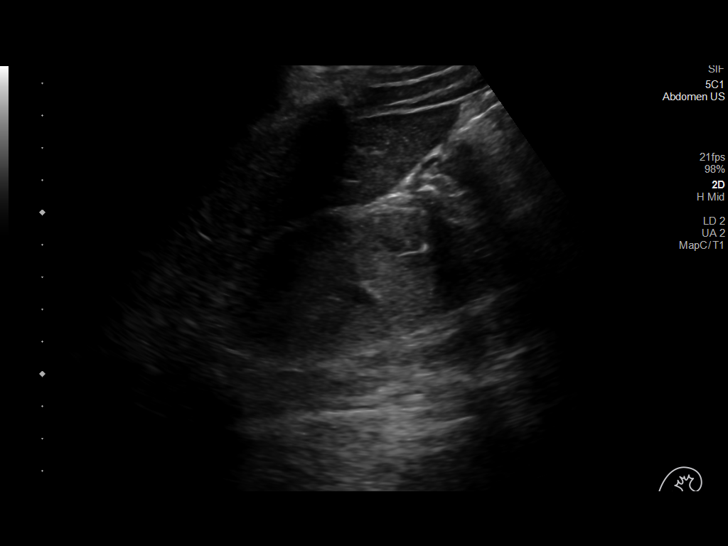
[im 61/91]
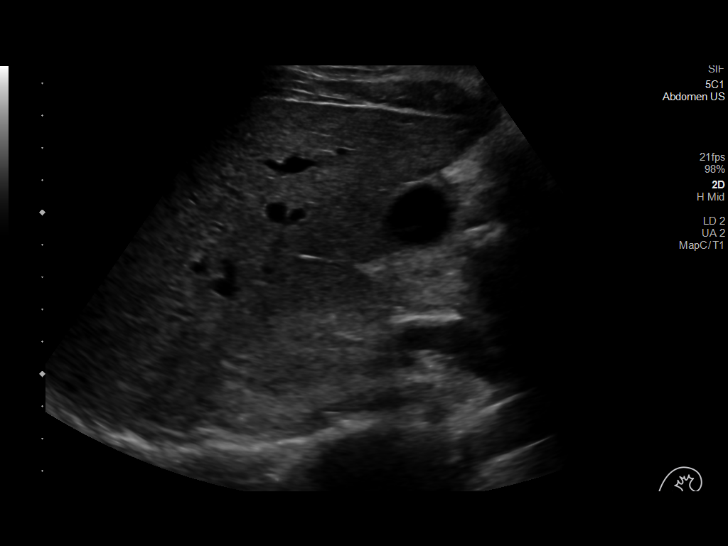
[im 68/91]
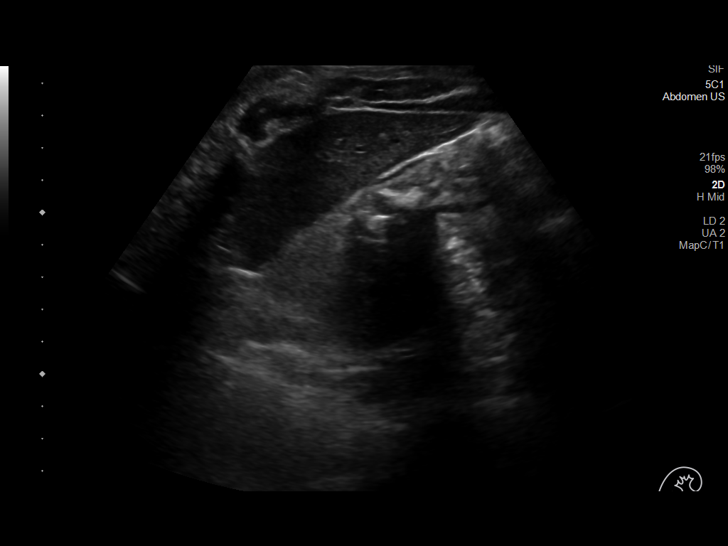
[im 76/91]
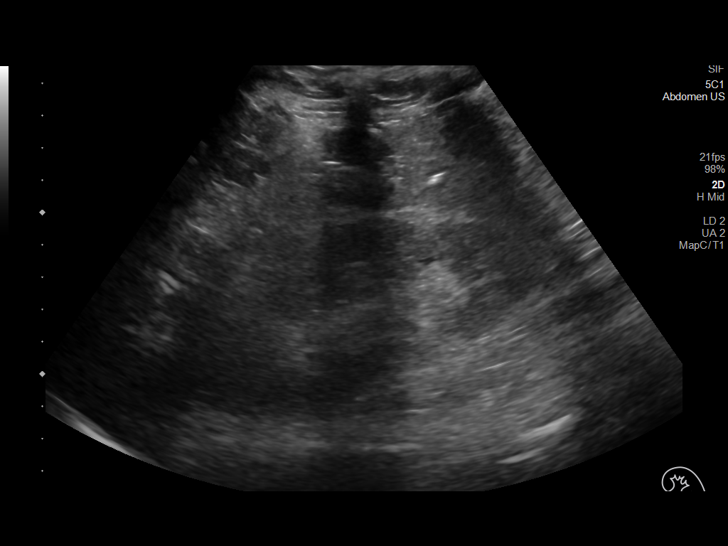
[im 83/91]
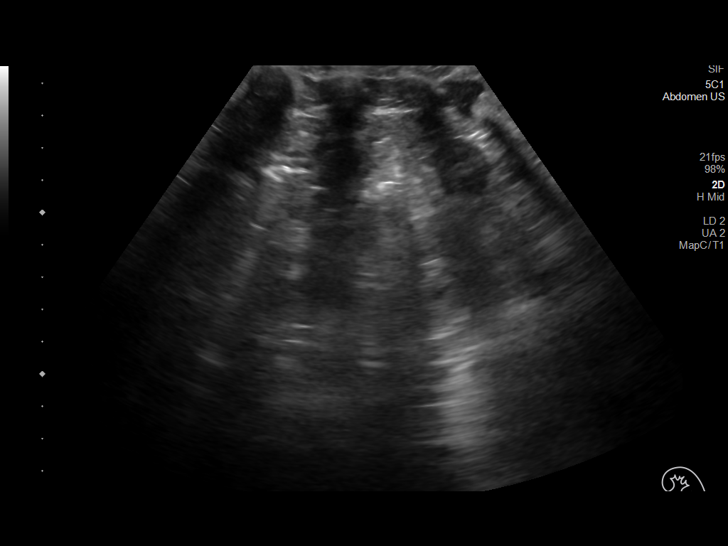
[im 91/91]
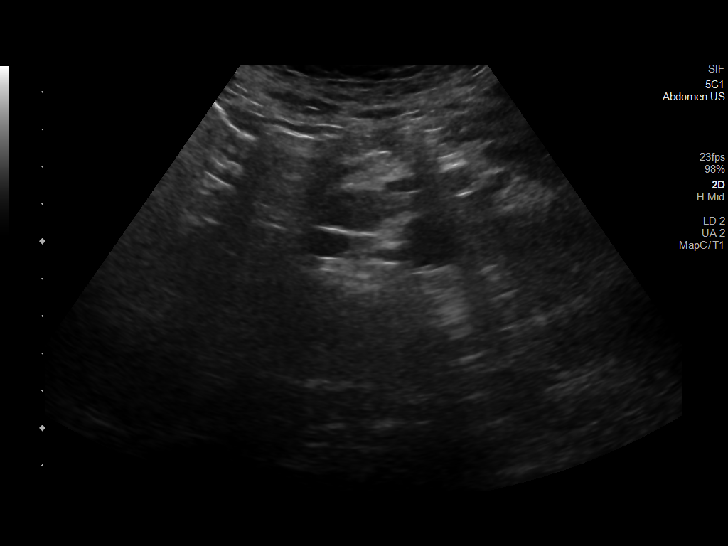

[14 of 25 positions shown; findings below may reference images not displayed]

FINDINGS: Gallbladder: No gallstones or wall thickening visualized. No
sonographic Murphy sign noted by sonographer.

Common bile duct: Diameter: 3.7 mm

Liver: There is slightly increased echogenicity. No focal
abnormality is seen. Portal vein is patent on color Doppler imaging
with normal direction of blood flow towards the liver.

IVC: No abnormality visualized.

Pancreas: Visualized portion unremarkable.

Spleen: Spleen is not sonographically visualized.

Right Kidney: Length: 10.7 cm. There is no hydronephrosis. There is
increased cortical echogenicity.

Left Kidney: Length: 9.8 cm. There is no hydronephrosis. There is
increased cortical echogenicity.

Abdominal aorta: No aneurysm visualized.

Other findings: None.
IMPRESSION: There is increased cortical echogenicity in both kidneys suggesting
medical renal disease. There is increased echogenicity in the liver
suggesting fatty infiltration. Spleen is not sonographically
visualized.

Abdominal sonogram is otherwise unremarkable.

## 2021-04-23 IMAGING — CR DG ABDOMEN ACUTE W/ 1V CHEST
3 series · 3 of 3 positions shown · non-contrast
Comparison: Study of [DATE].

CLINICAL DATA: Nausea and vomiting. Peritoneal dialysis since
[DATE].

EXAM:
DG ABDOMEN ACUTE WITH 1 VIEW CHEST

[chest pa]
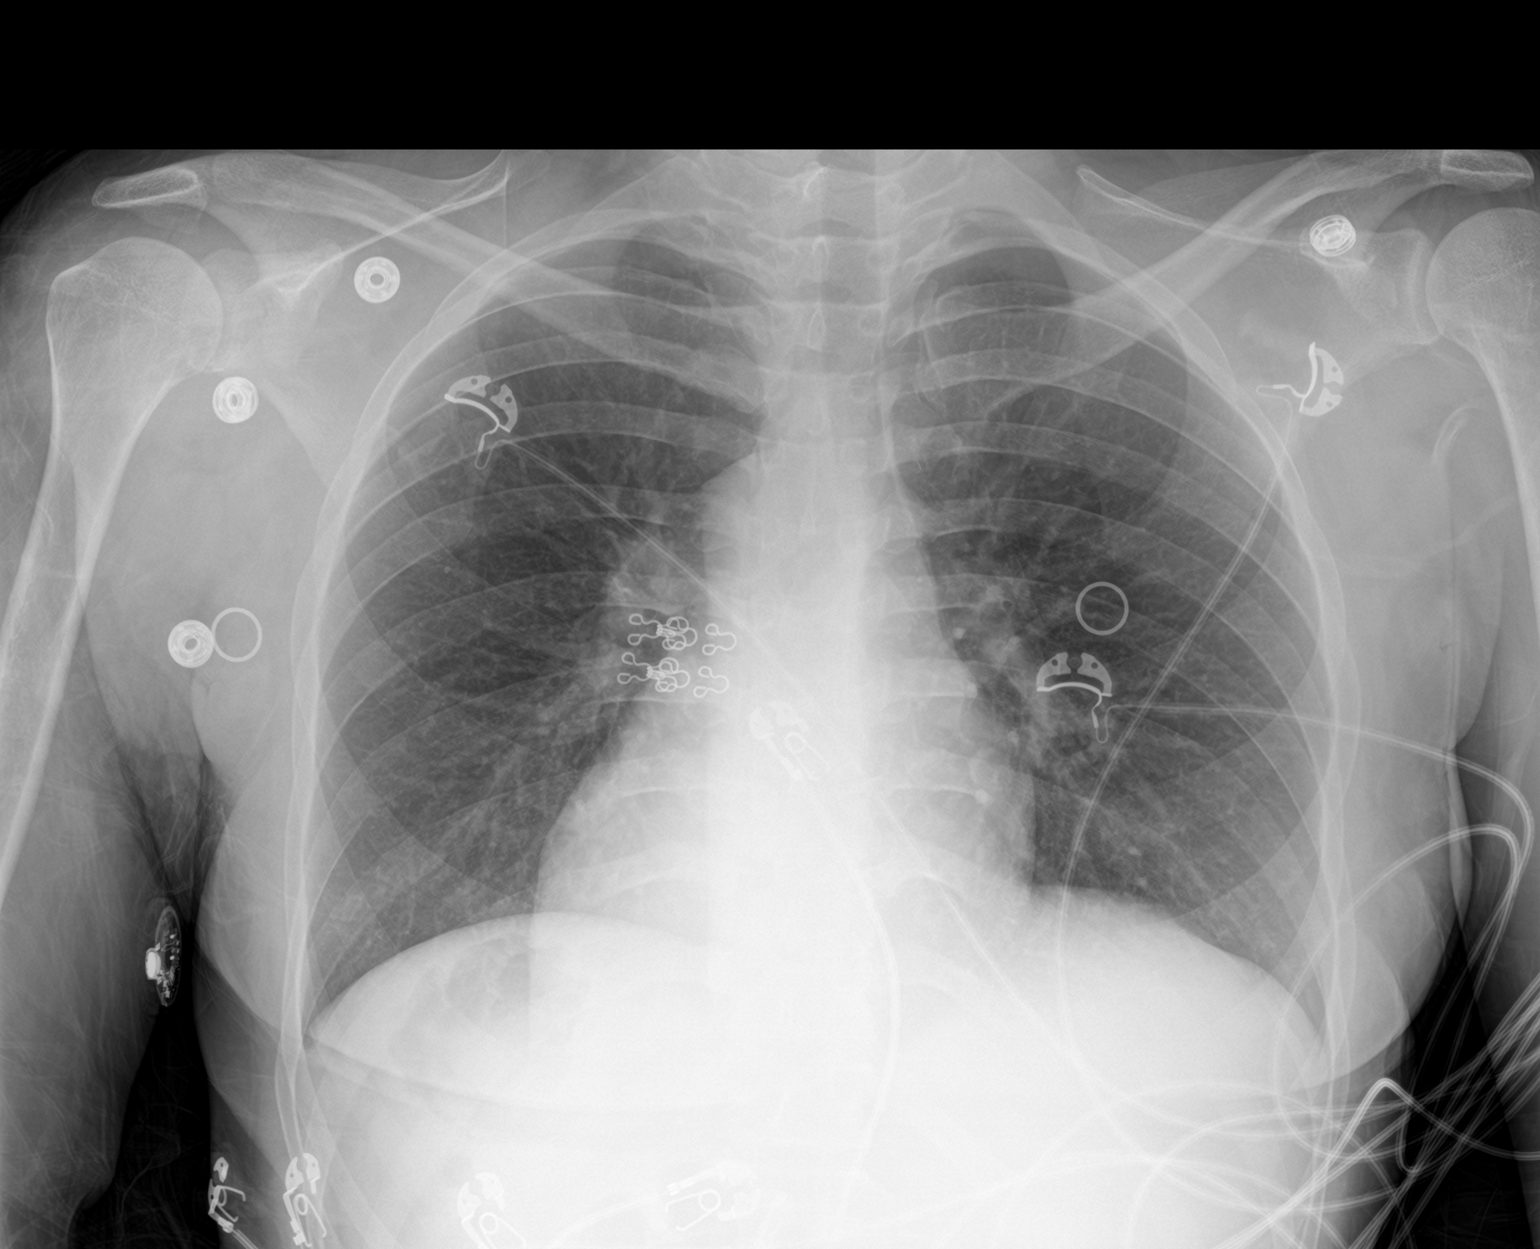

[abdomen erect]
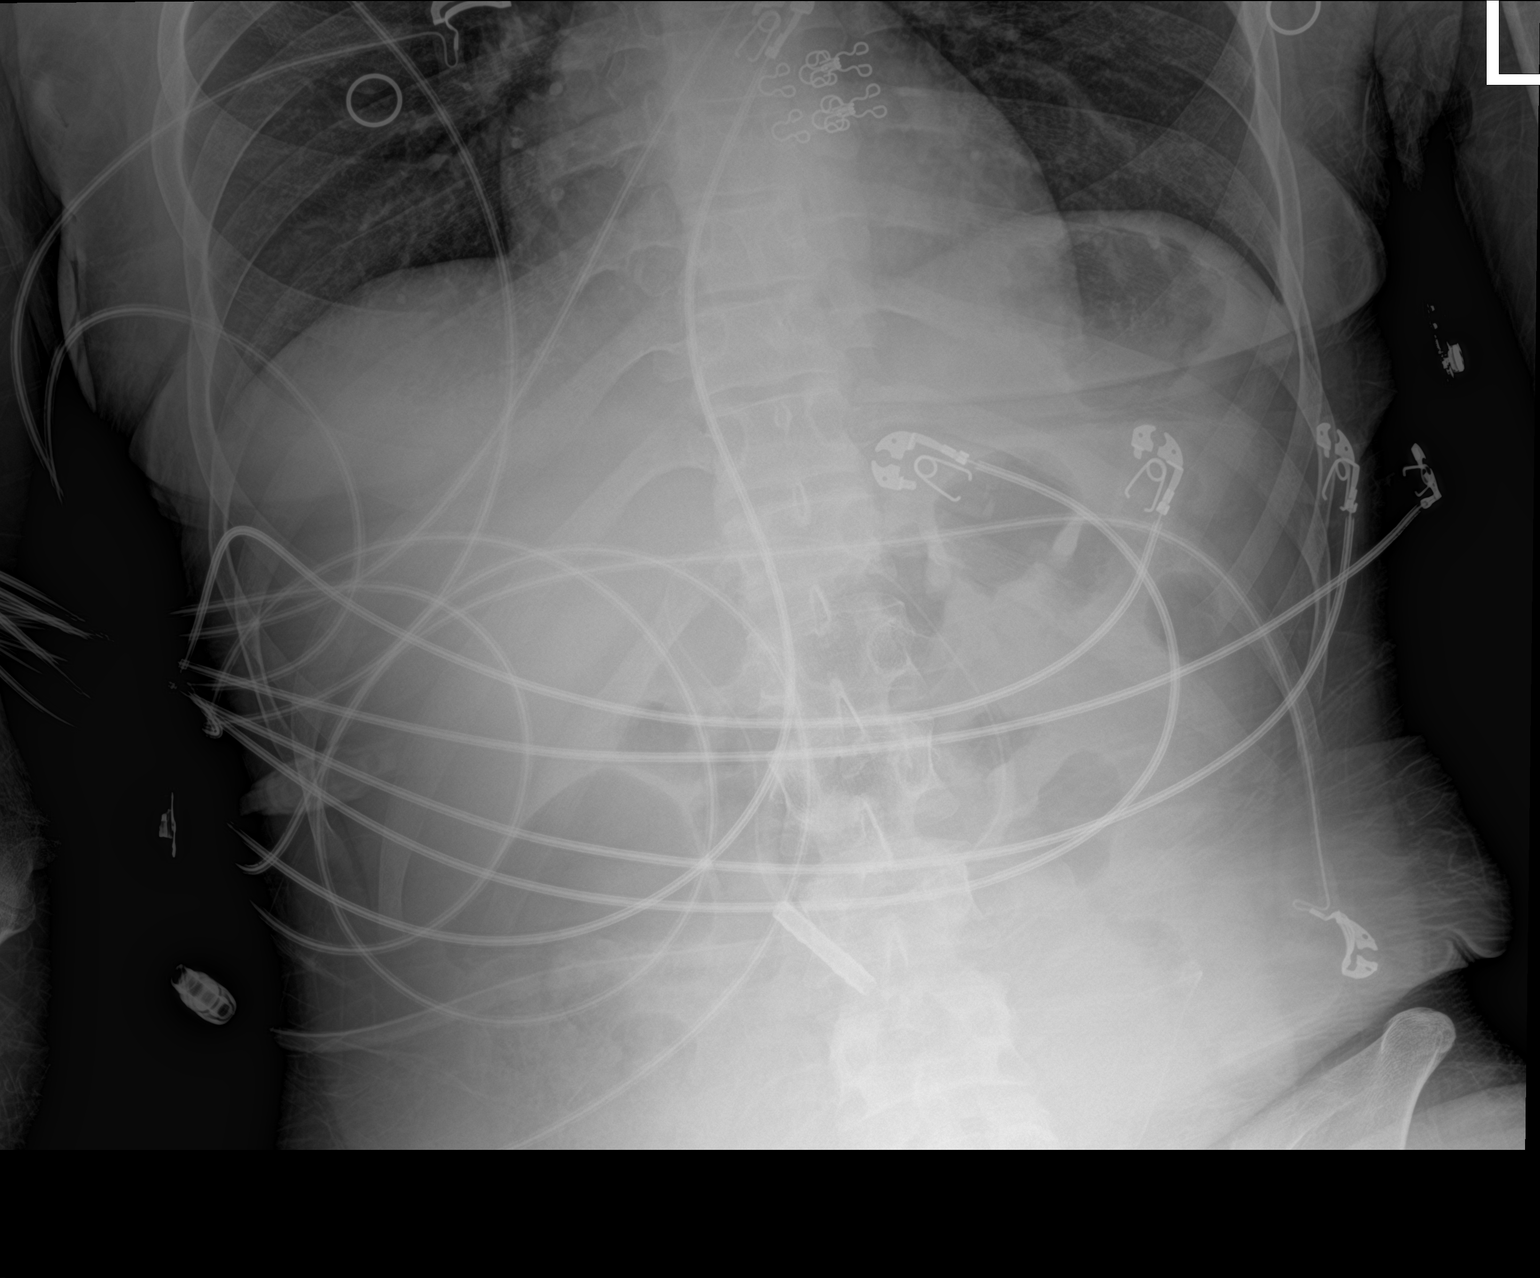

[abdomen supine]
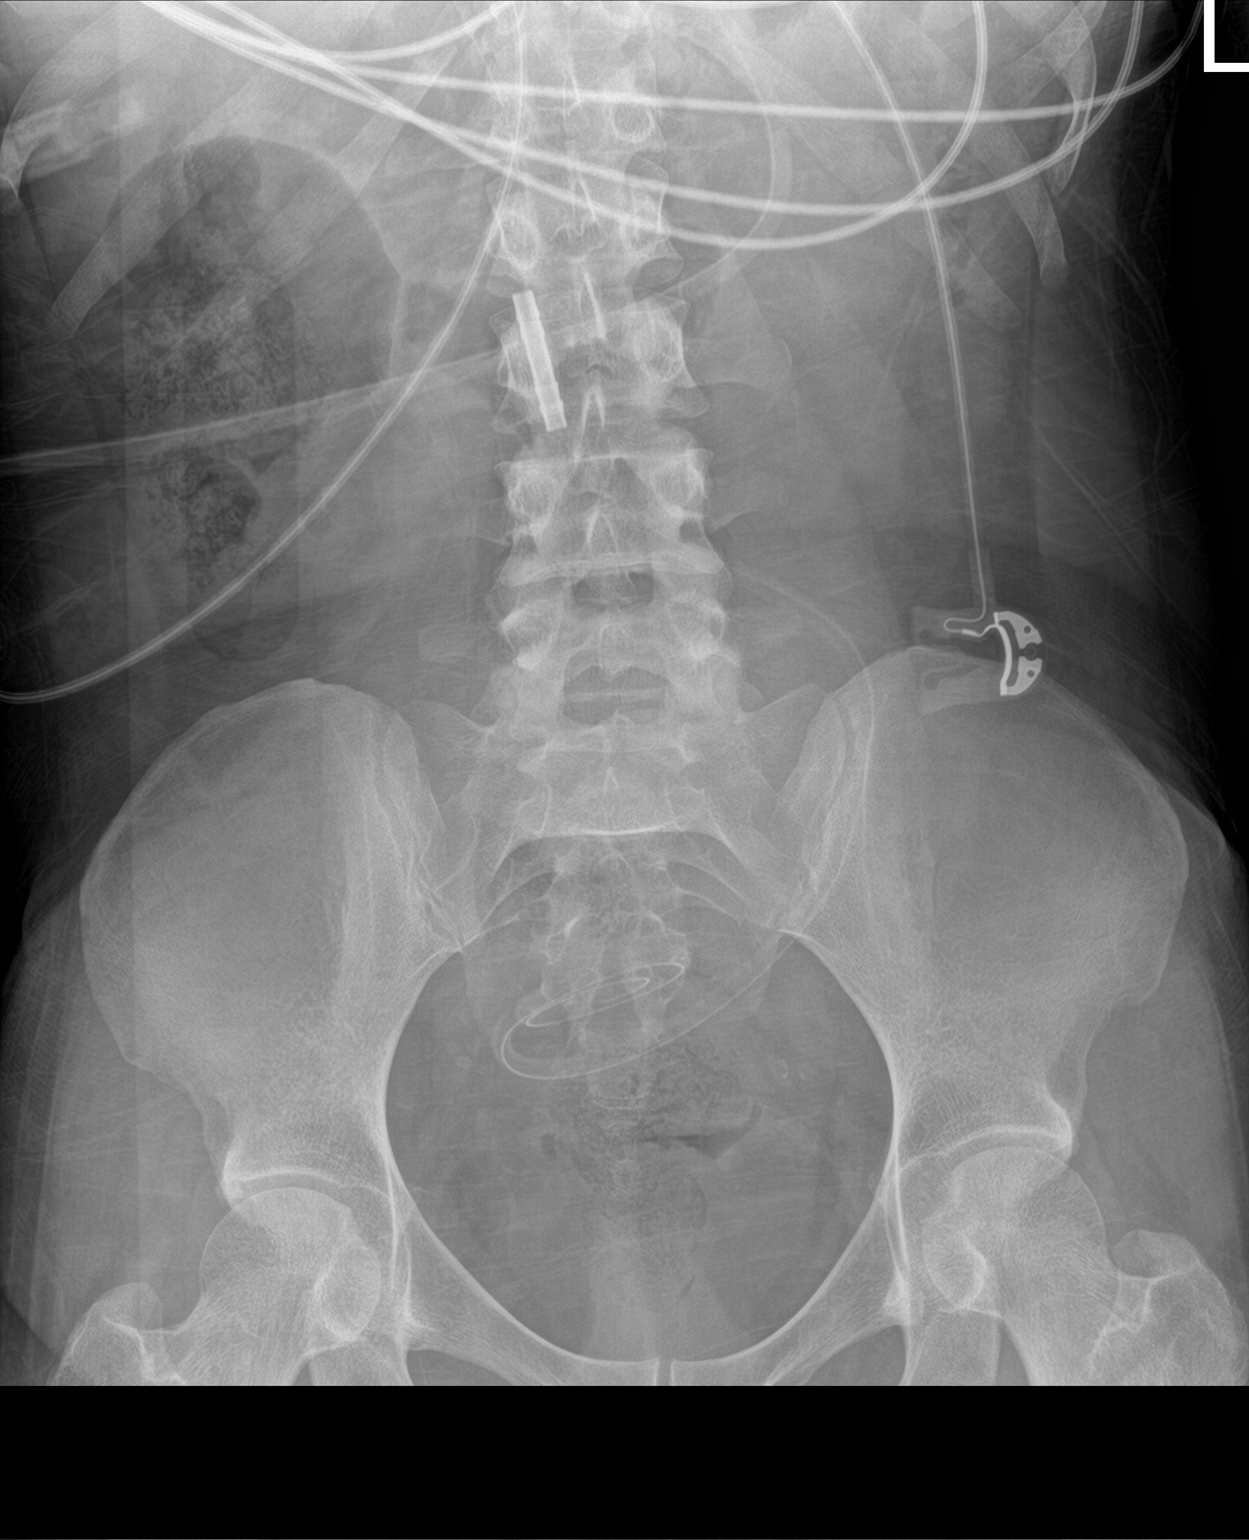

[3 of 3 positions shown; findings below may reference images not displayed]

FINDINGS: There is a tangle of overlying monitor wires over the upper abdomen.
There is no evidence of dilated bowel loops or free intraperitoneal
air.

Peritoneal dialysis catheter tubing is coiled in the upper true
pelvis. No radiopaque calculi or other significant radiographic
abnormality is seen.

Heart size and mediastinal contours are within normal limits. Both
lungs are clear.
IMPRESSION: No acute radiographic findings in the chest or abdomen.

## 2021-04-23 MED ORDER — POTASSIUM CHLORIDE 10 MEQ/100ML IV SOLN: 10.0000 meq | INTRAVENOUS | Status: DC

## 2021-04-23 MED ORDER — ONDANSETRON HCL 4 MG/2ML IJ SOLN
4.0000 mg | Freq: Once | INTRAMUSCULAR | Status: AC
  Administered 2021-04-23: 4 mg via INTRAVENOUS
  Filled 2021-04-23: qty 2

## 2021-04-23 MED ORDER — TRIMETHOBENZAMIDE HCL 100 MG/ML IM SOLN
100.0000 mg | Freq: Once | INTRAMUSCULAR | Status: AC
  Administered 2021-04-23: 100 mg via INTRAMUSCULAR
  Filled 2021-04-23: qty 1

## 2021-04-23 MED ORDER — INSULIN GLARGINE-YFGN 100 UNIT/ML ~~LOC~~ SOLN
20.0000 [IU] | Freq: Every day | SUBCUTANEOUS | Status: DC
  Filled 2021-04-23: qty 0.2

## 2021-04-23 MED ORDER — AMLODIPINE BESYLATE 10 MG PO TABS
10.0000 mg | ORAL_TABLET | Freq: Every day | ORAL | Status: DC
  Administered 2021-04-23 – 2021-04-26 (×4): 10 mg via ORAL
  Filled 2021-04-23 (×2): qty 1
  Filled 2021-04-23: qty 2
  Filled 2021-04-23: qty 1
  Filled 2021-04-23: qty 2

## 2021-04-23 MED ORDER — POTASSIUM CHLORIDE 10 MEQ/100ML IV SOLN
10.0000 meq | INTRAVENOUS | Status: AC
  Administered 2021-04-23 (×4): 10 meq via INTRAVENOUS
  Filled 2021-04-23 (×4): qty 100

## 2021-04-23 MED ORDER — HYDRALAZINE HCL 50 MG PO TABS
50.0000 mg | ORAL_TABLET | Freq: Three times a day (TID) | ORAL | Status: DC
  Administered 2021-04-23 – 2021-04-26 (×9): 50 mg via ORAL
  Filled 2021-04-23: qty 2
  Filled 2021-04-23 (×9): qty 1

## 2021-04-23 MED ORDER — POTASSIUM CHLORIDE 10 MEQ/100ML IV SOLN
10.0000 meq | INTRAVENOUS | Status: AC
  Administered 2021-04-23 – 2021-04-24 (×4): 10 meq via INTRAVENOUS
  Filled 2021-04-23 (×4): qty 100

## 2021-04-23 MED ORDER — DELFLEX-LC/1.5% DEXTROSE 344 MOSM/L IP SOLN: INTRAPERITONEAL | Status: DC

## 2021-04-23 MED ORDER — CARVEDILOL 25 MG PO TABS
25.0000 mg | ORAL_TABLET | Freq: Two times a day (BID) | ORAL | Status: DC
  Administered 2021-04-23 – 2021-04-26 (×7): 25 mg via ORAL
  Filled 2021-04-23 (×3): qty 1
  Filled 2021-04-23: qty 2
  Filled 2021-04-23 (×4): qty 1

## 2021-04-23 MED ORDER — KCL IN DEXTROSE-NACL 20-5-0.45 MEQ/L-%-% IV SOLN
INTRAVENOUS | Status: DC
  Filled 2021-04-23 (×3): qty 1000

## 2021-04-23 MED ORDER — POTASSIUM CL IN DEXTROSE 5% 20 MEQ/L IV SOLN
20.0000 meq | INTRAVENOUS | Status: DC
Start: 2021-04-23 — End: 2021-04-23
  Filled 2021-04-23: qty 1000

## 2021-04-23 MED ORDER — GENTAMICIN SULFATE 0.1 % EX CREA
1.0000 "application " | TOPICAL_CREAM | Freq: Every day | CUTANEOUS | Status: DC
  Administered 2021-04-24 – 2021-04-26 (×2): 1 via TOPICAL
  Filled 2021-04-23: qty 15

## 2021-04-23 MED ORDER — KCL-LACTATED RINGERS 20 MEQ/L IV SOLN
INTRAVENOUS | Status: DC
  Filled 2021-04-23: qty 1000

## 2021-04-23 NOTE — ED Notes (Signed)
Patient transported to X-ray 

## 2021-04-23 NOTE — Progress Notes (Signed)
MD with internal med wants nurse to hold insuline infusion until potasium level is resulted. ?

## 2021-04-23 NOTE — Progress Notes (Signed)
? ?HD#0 ?SUBJECTIVE:  ?Patient Summary: Barbara Donovan is a 31 y.o. with a pertinent PMH of ES RD on PD, hypertension, who presented with nausea and vomiting and admitted for probable DKA.  ? ?Overnight Events: Remained on insulin overnight ? ?Interim History: Patient assessed at bedside this AM.  She endorses nausea and nursing reports multiple episodes of vomiting this AM.  She states that the Reglan has not been helping with nausea. ? ?OBJECTIVE:  ?Vital Signs: ?Vitals:  ? 04/23/21 0330 04/23/21 0345 04/23/21 0515 04/23/21 0530  ?BP: 140/89 (!) 148/93 (!) 186/109 (!) 209/114  ?Pulse: 95 95 (!) 116 (!) 107  ?Resp: 16 15 (!) 27 (!) 22  ?Temp:      ?TempSrc:      ?SpO2: 98% 98% 100% 100%  ? ?Supplemental O2: Nasal Cannula ?SpO2: 100 % ? ?There were no vitals filed for this visit. ? ? ?Intake/Output Summary (Last 24 hours) at 04/23/2021 0625 ?Last data filed at 04/23/2021 0302 ?Gross per 24 hour  ?Intake 450 ml  ?Output --  ?Net 450 ml  ? ?Net IO Since Admission: 450 mL [04/23/21 0625] ? ?Physical Exam: ?Physical Exam ?Constitutional:   ?   Appearance: She is ill-appearing.  ?HENT:  ?   Mouth/Throat:  ?   Mouth: Mucous membranes are dry.  ?Cardiovascular:  ?   Rate and Rhythm: Normal rate and regular rhythm.  ?   Pulses: Normal pulses.  ?   Heart sounds: Normal heart sounds.  ?Pulmonary:  ?   Breath sounds: No wheezing or rales.  ?   Comments: Shallow breaths when asleep ?Abdominal:  ?   General: Abdomen is flat. Bowel sounds are normal.  ?   Palpations: Abdomen is soft.  ?Skin: ?   General: Skin is warm and dry.  ?Neurological:  ?   Mental Status: She is oriented to person, place, and time.  ?  ? ?Patient Lines/Drains/Airways Status   ? ? Active Line/Drains/Airways   ? ? Name Placement date Placement time Site Days  ? Peripheral IV 04/22/21 20 G 1.88" Anterior;Right Arm 04/22/21  2240  Arm  1  ? Incision (Closed) 09/10/20 Abdomen Other (Comment) 09/10/20  1028  -- 225  ? Incision - 4 Ports Abdomen 1: Umbilicus 2:  Mid;Upper 3: Right;Lateral 4: Right;Lower 09/10/20  --  -- 225  ? ?  ?  ? ?  ? ? ?Pertinent Labs: ? ?  Latest Ref Rng & Units 04/23/2021  ?  4:20 AM 04/22/2021  ?  4:59 PM 04/12/2021  ? 10:01 AM  ?CBC  ?WBC 4.0 - 10.5 K/uL 8.8   7.9     ?Hemoglobin 12.0 - 15.0 g/dL 11.9   13.7   16.3    ?Hematocrit 36.0 - 46.0 % 36.8   41.7   48.0    ?Platelets 150 - 400 K/uL 435   515     ? ? ? ?  Latest Ref Rng & Units 04/23/2021  ?  4:20 AM 04/23/2021  ? 12:22 AM 04/22/2021  ?  4:59 PM  ?CMP  ?Glucose 70 - 99 mg/dL 159   200   219    ?BUN 6 - 20 mg/dL 70   69   73    ?Creatinine 0.44 - 1.00 mg/dL 10.94   10.95   10.95    ?Sodium 135 - 145 mmol/L 137   135   138    ?Potassium 3.5 - 5.1 mmol/L 3.5   3.1   3.5    ?  Chloride 98 - 111 mmol/L 105   99   101    ?CO2 22 - 32 mmol/L 19   16   14     ?Calcium 8.9 - 10.3 mg/dL 9.8   10.2   10.6    ?Total Protein 6.5 - 8.1 g/dL   7.9    ?Total Bilirubin 0.3 - 1.2 mg/dL   0.9    ?Alkaline Phos 38 - 126 U/L   59    ?AST 15 - 41 U/L   19    ?ALT 0 - 44 U/L   19    ? ? ?Recent Labs  ?  04/23/21 ?0411 04/23/21 ?0510 04/23/21 ?2633  ?GLUCAP 185* 154* 176*  ?  ? ?Pertinent Imaging: ?DG Abd Acute W/Chest ? ?Result Date: 04/23/2021 ?CLINICAL DATA:  Nausea and vomiting. Peritoneal dialysis since August 2022. EXAM: DG ABDOMEN ACUTE WITH 1 VIEW CHEST COMPARISON:  Study of 01/28/2018. FINDINGS: There is a tangle of overlying monitor wires over the upper abdomen. There is no evidence of dilated bowel loops or free intraperitoneal air. Peritoneal dialysis catheter tubing is coiled in the upper true pelvis. No radiopaque calculi or other significant radiographic abnormality is seen. Heart size and mediastinal contours are within normal limits. Both lungs are clear. IMPRESSION: No acute radiographic findings in the chest or abdomen. Electronically Signed   By: Telford Nab M.D.   On: 04/23/2021 02:46   ? ?ASSESSMENT/PLAN:  ?Assessment: ?Principal Problem: ?  Nausea and vomiting ?Active Problems: ?  Diabetic retinopathy  ([place]) ?  Chronic hypertension ?  ESRD (end stage renal disease) on PD ?  Gastroparesis ? ? ?Barbara Donovan is a 31 y.o. 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  ? ?Plan: ?#Probable DKA ?#Type 1 diabetes mellitus ?#Intractable nausea and vomiting ?#Gastroparesis ?Presented due to intractable nausea and vomiting.  Her blood glucose elevated into the 200s, positive beta hydroxybutyrate.  Initial VBG showed pH to 7.5 with PCO2 19.6.  Anion gap elevated at 23.  She was started on insulin drip.  She is having difficulty tolerating p.o. intake due to nausea.  Unsure of inciting event for DKA.  Beta hCG and no signs of infection.  Insulin drip was held due to potassium less than 3.3.  This was repleted and insulin drip restarted.  ? ?-BMP every 4 hours, allow anion gap to close x2 before stopping insulin ?-Metoclopramide 5 mg daily as needed ?-Tigan 100 mg once ?-qt prolongation limits zofran use ? ?#Hypoxia ?Patient had episode of hypoxia this morning.  Nurse states that O2 saturation went down to 60s.  Patient was asleep at that time.  She was placed on 2 L nasal cannula and oxygen saturations back to greater than 95%.  Leukocytosis within normal limits.  Lung sounds were clear on exam.  Chest x-ray showed no evidence of active cardiopulmonary disease.  Unclear what caused her to be hypoxic.  On observation her respiratory rates decreased when she falls asleep.  They improve if woken up.  CO2 was noted within normal limits on ABG. ? ?-We will try to titrate down oxygen as tolerated ? 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 ? ?#Hypertension ?She was given amlodipine 10 mg, carvedilol 25 mg, and hydralazine today.  She has vomited up medications today. Blood pressure is elevated this afternoon.    ? ?-  Continue amlodipine 10 mg, carvedilol 25 mg twice daily, and hydralazine 50 3 times daily ? ? ?Best Practice: ?Diet: Renal diet ?IVF: Fluids: D5-0.45NS w KCl, Rate:  65 cc/hr x 12 hrs ?VTE: heparin injection 5,000 Units Start: 04/22/21 2230 ?Code: Full ?AB: none ?DISPO: Anticipated discharge in 2-3 days to Home pending Medical stability. ? ?Signature: ?Daleen Bo. Kiaraliz Rafuse, D.O.  ?Internal Medicine Resident, PGY-1 ?[facility]  ?Pager: [phone] ?6:25 AM, 04/23/2021  ? ?Please contact the on call pager after 5 pm and on weekends at [phone].  ?

## 2021-04-23 NOTE — ED Notes (Signed)
Pt's O2 sat 85% RA, pt placed on 2L [place] and O2 sats now 100%. ?

## 2021-04-23 NOTE — ED Notes (Signed)
Per EndoTool and MD Eulas Post, pt will begin to transition off of EndoTool. Will give long acting insulin at 0700 and keep Endotool running 2hr after subq insulin given. ?

## 2021-04-23 NOTE — ED Notes (Signed)
MD Ariwodo paged about pt's increased BP. ?

## 2021-04-23 NOTE — Consult Note (Signed)
Renal Service ?Consult Note ?[facility] ? ?Barbara Donovan ?04/23/2021 ?Sol Blazing, MD ?Requesting Physician: Dr. Saverio Danker ? ?Reason for Consult: ESRD pt w/ N/V and probable DKA ?HPI: The patient is a 31 y.o. year-old w/ hx of DM2, depression, ESRD on PD, HTN who presented to ED last night w/ N/V for 1 day. Unable to keep her medications down. Wasn't able to keep Reglan down either. Pt admitted w/ dx of gastroparesis, cyclical vomiting, DKA, possible PNA. IM team admitted. Pt was given IV reglan x 2, IV insulin drip/ protocol and IVF's. AG was > 20 and  this am is down to 14. K+ was 3.6 range. BP's were high in the ED.  We are asked to see for ESRD.   ? ?Pt seen in ED. No new c/o's, hx as above. Hx not missed any PD. Denies sig abd pain or CP.  ? ?ROS - denies CP, no joint pain, no HA, no blurry vision, no rash, no diarrhea ? ? ?Past Medical History  ?Past Medical History:  ?Diagnosis Date  ? Asthma   ? as a child, no problems as an adult, no inhaler  ? Cataract   ? NS OU  ? Chronic hypertension during pregnancy, antepartum 08/19/2017  ? Dehydration 01/28/2018  ? Depression during pregnancy, antepartum 07/07/2017  ? 6/20: Short trial of zoloft previously, reports didn't help much but also didn't give it a chance Discussed r/b/a SSRIs in pregnancy, agrees to try Zoloft again, rx sent No SI/HI/red flags  ? Diabetes ([place])   ? TYPE I. A1C 7.5% 05/31/20  ? Diabetic retinopathy ([place]) 06/09/2017  ? 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  ? ESRD on peritoneal dialysis [facility])   ? HTN (hypertension)   ? Hypertensive retinopathy   ? OU  ? Hypokalemia 01/22/2018  ? Hypomagnesemia 01/28/2018  ? Intractable nausea and vomiting 01/22/2018  ? Intrauterine growth restriction (IUGR) affecting care of mother 12/22/2017  ? Morbid obesity ([place])   ? Nephropathy, diabetic ([place]) 12/29/2017  ? Severe hyperemesis gravidarum 10/30/2017  ? Type I diabetes mellitus ([place]) 07/07/2017   ? Current Diabetic Medications:  Insulin  [x]  Aspirin 81 mg daily after 12 weeks (? A2/B GDM)  Required Referrals for A1GDM or A2GDM: [x]  Diabetes Education and Testing Supplies [x]  Nutrition Cousult  For A2/B GDM or higher classes of DM [x]  Diabetes Education and Testing Supplies [x]  Nutrition Counsult [x]  Fetal ECHO after 20 weeks  [x]  Eye exam for retina evaluation - severe retinopathy 7/19  Base  ? Ventricular septal defect (VSD) of fetus in singleton pregnancy, antepartum 09/30/2017  ? May go to newborn nursery per Dr. Lenard Simmer Echo prior to discharge  ? ?Past Surgical History  ?Past Surgical History:  ?Procedure Laterality Date  ? 25 GAUGE PARS PLANA VITRECTOMY WITH 20 GAUGE MVR PORT FOR MACULAR HOLE Left 07/20/2018  ? Procedure: [place] VITRECTOMY LEFT EYE;  Surgeon: Bernarda Caffey, MD;  Location: [place];  Service: Ophthalmology;  Laterality: Left;  ? CAPD INSERTION N/A 09/10/2020  ? Procedure: LAPAROSCOPIC INSERTION CONTINUOUS AMBULATORY PERITONEAL DIALYSIS  (CAPD) CATHETER;  Surgeon: Serafina Mitchell, MD;  Location: [place];  Service: Vascular;  Laterality: N/A;  ? CESAREAN SECTION N/A 12/26/2017  ? Procedure: CESAREAN SECTION;  Surgeon: Osborne Oman, MD;  Location: [place];  Service: Obstetrics;  Laterality: N/A;  ? EYE EXAMINATION UNDER ANESTHESIA Right 07/20/2018  ? Procedure: Eye Exam Under Anesthesia RIGHT EYE;  Surgeon: Coralyn Pear,  Aaron Edelman, MD;  Location: [place];  Service: Ophthalmology;  Laterality: Right;  ? EYE SURGERY Left 07/2018  ? GAS INSERTION Left 07/19/2019  ? Procedure: INSERTION OF GAS;  Surgeon: Bernarda Caffey, MD;  Location: [place];  Service: Ophthalmology;  Laterality: Left;  SF6  ? INJECTION OF SILICONE OIL Left 04/24/8293  ? Procedure: Injection Of Silicone Oil LEFT EYE;  Surgeon: Bernarda Caffey, MD;  Location: [place];  Service: Ophthalmology;  Laterality: Left;  ? IR FLUORO GUIDE CV LINE RIGHT  06/02/2020  ? IR US GUIDE VASC ACCESS RIGHT  06/02/2020  ? LASER PHOTO ABLATION Right  07/20/2018  ? Procedure: Laser Photo Ablation RIGHT EYE;  Surgeon: Bernarda Caffey, MD;  Location: [place];  Service: Ophthalmology;  Laterality: Right;  ? MEMBRANE PEEL Left 07/20/2018  ? Procedure: Membrane Peel LEFT EYE;  Surgeon: Bernarda Caffey, MD;  Location: [place];  Service: Ophthalmology;  Laterality: Left;  ? MEMBRANE PEEL Left 07/19/2019  ? Procedure: MEMBRANE PEEL;  Surgeon: Bernarda Caffey, MD;  Location: [place];  Service: Ophthalmology;  Laterality: Left;  ? MITOMYCIN C APPLICATION Bilateral 06/20/1306  ? Procedure: Avastin Application;  Surgeon: Bernarda Caffey, MD;  Location: [place];  Service: Ophthalmology;  Laterality: Bilateral;  ? PHOTOCOAGULATION WITH LASER Left 07/20/2018  ? Procedure: Photocoagulation With Laser LEFT EYE;  Surgeon: Bernarda Caffey, MD;  Location: [place];  Service: Ophthalmology;  Laterality: Left;  ? PHOTOCOAGULATION WITH LASER Left 07/19/2019  ? Procedure: PHOTOCOAGULATION WITH LASER;  Surgeon: Bernarda Caffey, MD;  Location: [place];  Service: Ophthalmology;  Laterality: Left;  ? RETINAL DETACHMENT SURGERY Left 07/20/2018  ? Dr. Bernarda Caffey  ? SILICON OIL REMOVAL Left 07/19/2019  ? Procedure: 25g PARS PLANA VITRECTOMY WITH SILICON OIL REMOVAL;  Surgeon: Bernarda Caffey, MD;  Location: [place];  Service: Ophthalmology;  Laterality: Left;  ? WISDOM TOOTH EXTRACTION    ? ?Family History  ?Family History  ?Problem Relation Age of Onset  ? Diabetes Mother   ? Aneurysm Mother 63  ? Seizures Mother   ? Diabetes Father   ? Cataracts Father   ? COPD Father   ? Heart attack Father   ? Heart disease Father   ? Healthy Sister   ? Healthy Daughter   ? Stroke Maternal Grandfather   ? Amblyopia Neg Hx   ? Blindness Neg Hx   ? Glaucoma Neg Hx   ? Macular degeneration Neg Hx   ? Retinal detachment Neg Hx   ? Strabismus Neg Hx   ? Retinitis pigmentosa Neg Hx   ? Colon cancer Neg Hx   ? Stomach cancer Neg Hx   ? Esophageal cancer Neg Hx   ? Pancreatic cancer Neg Hx   ? Liver disease Neg Hx   ? ?Social History  reports that she  has never smoked. She has never used smokeless tobacco. She reports that she does not currently use alcohol. She reports that she does not use drugs. ?Allergies No Known Allergies ?Home medications ?Prior to Admission medications   ?Medication Sig Start Date End Date Taking? Authorizing Provider  ?amLODipine (NORVASC) 10 MG tablet Take 1 tablet (10 mg total) by mouth daily. ?Patient taking differently: Take 10 mg by mouth at bedtime. 03/09/20  Yes Mercy Riding, MD  ?brimonidine (ALPHAGAN) 0.2 % ophthalmic solution Place 1 drop into the left eye 3 (three) times daily. 03/09/21  Yes [provider]  ?calcitRIOL (ROCALTROL) 0.5 MCG capsule Take 0.5 mcg by mouth daily. 12/02/20  Yes [provider]  ?carvedilol (COREG) 25 MG tablet Take 1 tablet (25 mg total) by mouth 2 (two) times daily. 03/07/19  Yes Imogene Burn, PA-C  ?FLUoxetine (PROZAC) 20 MG capsule Take 20 mg by mouth daily. 03/16/21  Yes [provider]  ?hydrALAZINE (APRESOLINE) 50 MG tablet Take 1 tablet (50 mg total) by mouth 3 (three) times daily. ?Patient taking differently: Take 50 mg by mouth in the morning and at bedtime. 12/31/20  Yes Domenic Polite, MD  ?insulin glargine (LANTUS SOLOSTAR) 100 UNIT/ML Solostar Pen Inject 20 Units into the skin every morning. 02/05/21  Yes Bonnielee Haff, MD  ?insulin lispro (HUMALOG KWIKPEN) 100 UNIT/ML KwikPen 3 times a day (just before each meal) 4-3-3 units, and pen needles 4/day 04/21/21  Yes Renato Shin, MD  ?metoCLOPramide (REGLAN) 5 MG tablet Take 1 tablet (5 mg total) by mouth 3 (three) times daily before meals. 02/23/21  Yes Sherwood Gambler, MD  ?multivitamin (RENA-VIT) TABS tablet Take 1 tablet by mouth at bedtime. 03/08/20  Yes Mercy Riding, MD  ?ondansetron (ZOFRAN-ODT) 4 MG disintegrating tablet Take 1 tablet (4 mg total) by mouth every 6 (six) hours as needed for nausea or vomiting. 04/07/21  Yes Demaio, Alexa, MD  ?promethazine (PHENERGAN) 25 MG suppository Place 1 suppository  (25 mg total) rectally every 6 (six) hours as needed for nausea or vomiting. 02/19/21  Yes Margarita Mail, PA-C  ?sevelamer carbonate (RENVELA) 800 MG tablet Take 1 tablet (800 mg total) by mouth 3 (three) time

## 2021-04-24 LAB — GLUCOSE, CAPILLARY
Glucose-Capillary: 124 mg/dL — ABNORMAL HIGH (ref 70–99)
Glucose-Capillary: 154 mg/dL — ABNORMAL HIGH (ref 70–99)
Glucose-Capillary: 157 mg/dL — ABNORMAL HIGH (ref 70–99)
Glucose-Capillary: 160 mg/dL — ABNORMAL HIGH (ref 70–99)
Glucose-Capillary: 164 mg/dL — ABNORMAL HIGH (ref 70–99)
Glucose-Capillary: 172 mg/dL — ABNORMAL HIGH (ref 70–99)
Glucose-Capillary: 173 mg/dL — ABNORMAL HIGH (ref 70–99)
Glucose-Capillary: 174 mg/dL — ABNORMAL HIGH (ref 70–99)
Glucose-Capillary: 183 mg/dL — ABNORMAL HIGH (ref 70–99)
Glucose-Capillary: 197 mg/dL — ABNORMAL HIGH (ref 70–99)
Glucose-Capillary: 205 mg/dL — ABNORMAL HIGH (ref 70–99)
Glucose-Capillary: 205 mg/dL — ABNORMAL HIGH (ref 70–99)
Glucose-Capillary: 206 mg/dL — ABNORMAL HIGH (ref 70–99)
Glucose-Capillary: 212 mg/dL — ABNORMAL HIGH (ref 70–99)
Glucose-Capillary: 213 mg/dL — ABNORMAL HIGH (ref 70–99)
Glucose-Capillary: 214 mg/dL — ABNORMAL HIGH (ref 70–99)
Glucose-Capillary: 221 mg/dL — ABNORMAL HIGH (ref 70–99)
Glucose-Capillary: 222 mg/dL — ABNORMAL HIGH (ref 70–99)
Glucose-Capillary: 268 mg/dL — ABNORMAL HIGH (ref 70–99)

## 2021-04-24 LAB — CBC
HCT: 38.7 % (ref 36.0–46.0)
Hemoglobin: 13.2 g/dL (ref 12.0–15.0)
MCH: 28.7 pg (ref 26.0–34.0)
MCHC: 34.1 g/dL (ref 30.0–36.0)
MCV: 84.1 fL (ref 80.0–100.0)
Platelets: 513 10*3/uL — ABNORMAL HIGH (ref 150–400)
RBC: 4.6 MIL/uL (ref 3.87–5.11)
RDW: 14.6 % (ref 11.5–15.5)
WBC: 9.5 10*3/uL (ref 4.0–10.5)
nRBC: 0 % (ref 0.0–0.2)

## 2021-04-24 LAB — BASIC METABOLIC PANEL
Anion gap: 14 (ref 5–15)
Anion gap: 14 (ref 5–15)
Anion gap: 17 — ABNORMAL HIGH (ref 5–15)
Anion gap: 14 (ref 5–15)
Anion gap: 12 (ref 5–15)
Anion gap: 14 (ref 5–15)
BUN: 59 mg/dL — ABNORMAL HIGH (ref 6–20)
BUN: 56 mg/dL — ABNORMAL HIGH (ref 6–20)
BUN: 56 mg/dL — ABNORMAL HIGH (ref 6–20)
BUN: 55 mg/dL — ABNORMAL HIGH (ref 6–20)
BUN: 55 mg/dL — ABNORMAL HIGH (ref 6–20)
BUN: 55 mg/dL — ABNORMAL HIGH (ref 6–20)
CO2: 21 mmol/L — ABNORMAL LOW (ref 22–32)
CO2: 22 mmol/L (ref 22–32)
CO2: 20 mmol/L — ABNORMAL LOW (ref 22–32)
CO2: 19 mmol/L — ABNORMAL LOW (ref 22–32)
CO2: 21 mmol/L — ABNORMAL LOW (ref 22–32)
CO2: 18 mmol/L — ABNORMAL LOW (ref 22–32)
Calcium: 10.2 mg/dL (ref 8.9–10.3)
Calcium: 9.8 mg/dL (ref 8.9–10.3)
Calcium: 10.3 mg/dL (ref 8.9–10.3)
Calcium: 10.1 mg/dL (ref 8.9–10.3)
Calcium: 9.3 mg/dL (ref 8.9–10.3)
Calcium: 9.1 mg/dL (ref 8.9–10.3)
Chloride: 102 mmol/L (ref 98–111)
Chloride: 99 mmol/L (ref 98–111)
Chloride: 99 mmol/L (ref 98–111)
Chloride: 101 mmol/L (ref 98–111)
Chloride: 98 mmol/L (ref 98–111)
Chloride: 99 mmol/L (ref 98–111)
Creatinine, Ser: 10.63 mg/dL — ABNORMAL HIGH (ref 0.44–1.00)
Creatinine, Ser: 10.37 mg/dL — ABNORMAL HIGH (ref 0.44–1.00)
Creatinine, Ser: 10.52 mg/dL — ABNORMAL HIGH (ref 0.44–1.00)
Creatinine, Ser: 10.22 mg/dL — ABNORMAL HIGH (ref 0.44–1.00)
Creatinine, Ser: 10.22 mg/dL — ABNORMAL HIGH (ref 0.44–1.00)
Creatinine, Ser: 10.14 mg/dL — ABNORMAL HIGH (ref 0.44–1.00)
GFR, Estimated: 5 mL/min — ABNORMAL LOW (ref >60)
GFR, Estimated: 5 mL/min — ABNORMAL LOW (ref >60)
GFR, Estimated: 5 mL/min — ABNORMAL LOW (ref >60)
GFR, Estimated: 5 mL/min — ABNORMAL LOW (ref >60)
GFR, Estimated: 5 mL/min — ABNORMAL LOW (ref >60)
GFR, Estimated: 5 mL/min — ABNORMAL LOW (ref >60)
Glucose, Bld: 161 mg/dL — ABNORMAL HIGH (ref 70–99)
Glucose, Bld: 192 mg/dL — ABNORMAL HIGH (ref 70–99)
Glucose, Bld: 229 mg/dL — ABNORMAL HIGH (ref 70–99)
Glucose, Bld: 119 mg/dL — ABNORMAL HIGH (ref 70–99)
Glucose, Bld: 170 mg/dL — ABNORMAL HIGH (ref 70–99)
Glucose, Bld: 190 mg/dL — ABNORMAL HIGH (ref 70–99)
Potassium: 3.3 mmol/L — ABNORMAL LOW (ref 3.5–5.1)
Potassium: 3.9 mmol/L (ref 3.5–5.1)
Potassium: 3.8 mmol/L (ref 3.5–5.1)
Potassium: 3.9 mmol/L (ref 3.5–5.1)
Potassium: 3.8 mmol/L (ref 3.5–5.1)
Potassium: 4.9 mmol/L (ref 3.5–5.1)
Sodium: 137 mmol/L (ref 135–145)
Sodium: 135 mmol/L (ref 135–145)
Sodium: 136 mmol/L (ref 135–145)
Sodium: 134 mmol/L — ABNORMAL LOW (ref 135–145)
Sodium: 131 mmol/L — ABNORMAL LOW (ref 135–145)
Sodium: 131 mmol/L — ABNORMAL LOW (ref 135–145)

## 2021-04-24 LAB — BETA-HYDROXYBUTYRIC ACID: Beta-Hydroxybutyric Acid: 1.26 mmol/L — ABNORMAL HIGH (ref 0.05–0.27)

## 2021-04-24 LAB — MAGNESIUM: Magnesium: 2.5 mg/dL — ABNORMAL HIGH (ref 1.7–2.4)

## 2021-04-24 LAB — POTASSIUM: Potassium: 3.9 mmol/L (ref 3.5–5.1)

## 2021-04-24 MED ORDER — PROCHLORPERAZINE EDISYLATE 10 MG/2ML IJ SOLN
10.0000 mg | Freq: Once | INTRAMUSCULAR | Status: AC
  Administered 2021-04-24: 10 mg via INTRAVENOUS
  Filled 2021-04-24: qty 2

## 2021-04-24 MED ORDER — INSULIN ASPART 100 UNIT/ML IJ SOLN
0.0000 [IU] | Freq: Three times a day (TID) | INTRAMUSCULAR | Status: DC
  Administered 2021-04-25: 2 [IU] via SUBCUTANEOUS
  Administered 2021-04-25: 1 [IU] via SUBCUTANEOUS
  Administered 2021-04-25: 2 [IU] via SUBCUTANEOUS
  Administered 2021-04-26: 1 [IU] via SUBCUTANEOUS

## 2021-04-24 MED ORDER — METOCLOPRAMIDE HCL 5 MG/ML IJ SOLN: 5.0000 mg | Freq: Three times a day (TID) | INTRAMUSCULAR | Status: DC

## 2021-04-24 MED ORDER — METOCLOPRAMIDE HCL 5 MG/ML IJ SOLN
5.0000 mg | Freq: Three times a day (TID) | INTRAMUSCULAR | Status: DC
  Administered 2021-04-24 – 2021-04-25 (×3): 5 mg via INTRAVENOUS
  Filled 2021-04-24 (×3): qty 2

## 2021-04-24 MED ORDER — INSULIN GLARGINE-YFGN 100 UNIT/ML ~~LOC~~ SOLN
15.0000 [IU] | Freq: Every day | SUBCUTANEOUS | Status: DC
  Filled 2021-04-24: qty 0.15

## 2021-04-24 MED ORDER — POTASSIUM CHLORIDE 10 MEQ/100ML IV SOLN
10.0000 meq | INTRAVENOUS | Status: AC
  Administered 2021-04-24 (×4): 10 meq via INTRAVENOUS
  Filled 2021-04-24 (×4): qty 100

## 2021-04-24 MED ORDER — METOCLOPRAMIDE HCL 5 MG/ML IJ SOLN
5.0000 mg | Freq: Once | INTRAMUSCULAR | Status: AC
  Administered 2021-04-24: 5 mg via INTRAVENOUS
  Filled 2021-04-24: qty 2

## 2021-04-24 MED ORDER — ONDANSETRON HCL 4 MG/2ML IJ SOLN
4.0000 mg | Freq: Once | INTRAMUSCULAR | Status: AC
  Administered 2021-04-24: 4 mg via INTRAVENOUS
  Filled 2021-04-24: qty 2

## 2021-04-24 MED ORDER — INSULIN GLARGINE-YFGN 100 UNIT/ML ~~LOC~~ SOLN
15.0000 [IU] | Freq: Once | SUBCUTANEOUS | Status: AC
  Administered 2021-04-24: 15 [IU] via SUBCUTANEOUS
  Filled 2021-04-24: qty 0.15

## 2021-04-24 MED ORDER — PROCHLORPERAZINE EDISYLATE 10 MG/2ML IJ SOLN
5.0000 mg | Freq: Once | INTRAMUSCULAR | Status: AC
  Administered 2021-04-24: 5 mg via INTRAVENOUS
  Filled 2021-04-24: qty 1

## 2021-04-24 NOTE — Progress Notes (Signed)
Spoke with Masters, MD discussing EndoTool discontinuation orders. Per Masters, MD, patient will stay on insulin drip and EndoTool as per current orders for 2-4 hours after long acting insulin given (given at 1604) and repeat anion gap results. BMP ordered and phlebotomy notified of order. Patient updated and verbalizes understanding.  ?

## 2021-04-24 NOTE — Progress Notes (Signed)
?[facility] ?Progress Note  ? ?Subjective:  ?Seen in room. Doesn't feel good, vomiting overnight. Nauseated this am. No abdominal pain. No issues with PD overnight. UF 887 mL  ? ?Objective ?Vitals:  ? 04/24/21 0011 04/24/21 0411 04/24/21 0719 04/24/21 1113  ?BP: (!) 148/98 (!) 163/101 (!) 187/104 132/86  ?Pulse: 90 99 100   ?Resp: 16 16 14 18   ?Temp: 98 ?F (36.7 ?C) 98.7 ?F (37.1 ?C) 98.7 ?F (37.1 ?C)   ?TempSrc: Oral Oral Oral   ?SpO2: 96% 98% 98% 98%  ?Weight:  64 kg    ?Height:      ?  ? ? ?Additional Objective ?Labs: ?Basic Metabolic Panel: ?Recent Labs  ?Lab 04/23/21 ?1433 04/23/21 ?1511 04/24/21 ?3086 04/24/21 ?5784 04/24/21 ?1018  ?NA  --    < > 137 135 136  ?K  --    < > 3.3* 3.9 3.8  ?CL  --    < > 102 99 99  ?CO2  --    < > 21* 22 20*  ?GLUCOSE  --    < > 161* 192* 229*  ?BUN  --    < > 59* 56* 56*  ?CREATININE  --    < > 10.63* 10.37* 10.52*  ?CALCIUM  --    < > 10.2 9.8 10.3  ?PHOS 6.5*  --   --   --   --   ? < > = values in this interval not displayed.  ? ?CBC: ?Recent Labs  ?Lab 04/22/21 ?1659 04/22/21 ?1826 04/23/21 ?6962 04/23/21 ?1112 04/24/21 ?9528  ?WBC 7.9  --  8.8  --  9.5  ?NEUTROABS 6.7  --   --   --   --   ?HGB 13.7   < > 11.9* 13.6 13.2  ?HCT 41.7   < > 36.8 40.0 38.7  ?MCV 86.5  --  88.7  --  84.1  ?PLT 515*  --  435*  --  513*  ? < > = values in this interval not displayed.  ? ?Blood Culture ?   ?Component Value Date/Time  ? SDES FLUID 02/04/2021 1404  ? [place] [facility] 02/04/2021 1404  ? CULT  02/04/2021 1404  ?  NO GROWTH 3 DAYS ?Performed at [facility], [place] [address] [place], [place] [address] ?  ? REPTSTATUS 02/08/2021 FINAL 02/04/2021 1404  ? ? ? ?Physical Exam ?General: Chronically ill appearing, nad  ?Heart: RRR No m,r, g  ?Lungs: Clear bilaterally  ?Abdomen: soft non-tender  ?Extremities: No LE edema  ?Dialysis Access: PD cath in place  ? ?Medications: ? dextrose 5 % and 0.45 % NaCl with KCl 20 mEq/L 65 mL/hr at 04/24/21 4010  ? dialysis  solution 1.5% low-MG/low-CA    ? insulin 3 Units/hr (04/24/21 1113)  ? ? amLODipine  10 mg Oral Daily  ? carvedilol  25 mg Oral BID WC  ? gentamicin cream  1 application. Topical Daily  ? heparin  5,000 Units Subcutaneous Q8H  ? hydrALAZINE  50 mg Oral TID  ? metoCLOPramide (REGLAN) injection  5 mg Intravenous Q8H  ? ? ?Dialysis Orders:  ?7d per week, 5 exchanges x 2000 each, 10L total. 1.5h dwells. 63kg dry wt. No daybag ? ?Assessment/Plan: ?DKA - per primary team  ?Volume - w/ ongoing N/V. S/p IVF yesterday. BP better.  Weight up today.  ?ESRD - on CCPD.  Continue PD tonight w/ all 1.5% fluids.  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 Phos 6.5. Add binder when tolerating PO.  ? ?Lynnda Child PA-C ?[facility] ?04/24/2021,11:46 AM ? ? ? ? ? ? ? ?

## 2021-04-24 NOTE — Progress Notes (Signed)
MD advice to hold insulin drip until potasium level resulted. See care instruction order. ?

## 2021-04-24 NOTE — Progress Notes (Signed)
Inpatient Diabetes Program Recommendations ? ?AACE/ADA: New Consensus Statement on Inpatient Glycemic Control (2015) ? ?Target Ranges:  Prepandial:   less than 140 mg/dL ?     Peak postprandial:   less than 180 mg/dL (1-2 hours) ?     Critically ill patients:  140 - 180 mg/dL  ? ?Lab Results  ?Component Value Date  ? GLUCAP 206 (H) 04/24/2021  ? HGBA1C 7.6 (H) 03/25/2021  ? ? Latest Reference Range & Units 04/24/21 03:06 04/24/21 04:01 04/24/21 05:06 04/24/21 06:14 04/24/21 07:17  ?Glucose-Capillary 70 - 99 mg/dL 173 (H) 157 (H) 197 (H) 205 (H) 206 (H)  ?(H): Data is abnormally high ?Review of Glycemic Control ? ?Diabetes history: type 1 ?Outpatient Diabetes medications: Lantus 20 units every am, Humalog 4units at breakfast, 3 units at lunch, 3 units at dinner ?Current orders for Inpatient glycemic control: IV insulin  ? ?Inpatient Diabetes Program Recommendations:   ?Spoke with staff RN in regards to IV insulin. Iv insulin has been stopped several times during the night due to concern for low potassium levels. Iv insulin has been off for about 2-3 hours. CBGs 206 mg/dl at 07:15. No basal insulin has been given. Anion gap is 14, CO2 is 22 at 06:55. Potassium level is 39.  ? ?Recommend giving Lantus 20 units now and starting Novolog SENSITIVE correction scale now and every 4 hours until eating, then TID & adding Novolog 0-5 units HS scale.  ? ? ?Harvel Ricks RN BSN CDE ?Diabetes Coordinator ?Pager: [phone]  8am-5pm  ? ? ?

## 2021-04-24 NOTE — TOC Progression Note (Signed)
Transition of Care (TOC) - Progression Note  ? ? ?Patient Details  ?Name: ALANIS CLIFT ?MRN: [id number] ?Date of Birth: 1990-01-31 ? ?Transition of Care (TOC) CM/SW Contact  ?Zenon Mayo, RN ?Phone Number: ?04/24/2021, 8:01 AM ? ?Clinical Narrative:    ? ?Transition of Care (TOC) Screening Note ? ? ?Patient Details  ?Name: CHASTY RANDAL ?Date of Birth: 1990-08-08 ? ? ?Transition of Care (TOC) CM/SW Contact:    ?Zenon Mayo, RN ?Phone Number: ?04/24/2021, 8:01 AM ? ? ? ?Transition of Care Department [facility]) has reviewed patient and no TOC needs have been identified at this time. We will continue to monitor patient advancement through interdisciplinary progression rounds. If new patient transition needs arise, please place a TOC consult. ?  ? ? ?  ?  ? ?Expected Discharge Plan and Services ?  ?  ?  ?  ?  ?                ?  ?  ?  ?  ?  ?  ?  ?  ?  ?  ? ? ?Social Determinants of Health (SDOH) Interventions ?  ? ?Readmission Risk Interventions ? ?  06/09/2020  ?  1:04 PM  ?Readmission Risk Prevention Plan  ?Transportation Screening Complete  ?PCP or Specialist appointment within 3-5 days of discharge Complete  ?[place] or Home Care Consult Complete  ?SW Recovery Care/Counseling Consult Complete  ?Palliative Care Screening Not Applicable  ?[place] Not Applicable  ? ? ?

## 2021-04-24 NOTE — Progress Notes (Addendum)
? ?HD#1 ?SUBJECTIVE:  ?Patient Summary: Barbara Donovan is a 31 y.o. with a pertinent PMH of ES RD on PD, hypertension, who presented with nausea and vomiting and admitted for DKA.  ? ?Overnight Events: Insulin drip was held at 5 AM.  ? ?Interim History: Patient assessed at bedside this AM.  She states that she feels nauseated.  She was given Reglan and Zofran and she did not notice any improvement in her symptoms.  She denies abdominal pain. She had one episode of vomiting overnight. ? ? ?OBJECTIVE:  ?Vital Signs: ?Vitals:  ? 04/23/21 1953 04/24/21 0011 04/24/21 0411 04/24/21 0719  ?BP: (!) 166/90 (!) 148/98 (!) 163/101 (!) 187/104  ?Pulse: 93 90 99 100  ?Resp: 16 16 16 14   ?Temp: 98.4 ?F (36.9 ?C) 98 ?F (36.7 ?C) 98.7 ?F (37.1 ?C) 98.7 ?F (37.1 ?C)  ?TempSrc: Oral Oral Oral Oral  ?SpO2: 97% 96% 98% 98%  ?Weight:   64 kg   ?Height:      ? ?Supplemental O2: Room Air ?SpO2: 98 % ?O2 Flow Rate (L/min): 2 L/min ? ?Filed Weights  ? 04/23/21 1510 04/23/21 1812 04/24/21 0411  ?Weight: 62.6 kg 62.6 kg 64 kg  ? ? ? ?Intake/Output Summary (Last 24 hours) at 04/24/2021 1110 ?Last data filed at 04/24/2021 0900 ?Gross per 24 hour  ?Intake 2665.14 ml  ?Output 3191 ml  ?Net -525.86 ml  ? ?Net IO Since Admission: 924.14 mL [04/24/21 1110] ? ?Physical Exam: ?Physical Exam ?Constitutional:   ?   General: She is not in acute distress. ?HENT:  ?   Mouth/Throat:  ?   Mouth: Mucous membranes are dry.  ?Cardiovascular:  ?   Rate and Rhythm: Normal rate and regular rhythm.  ?   Pulses: Normal pulses.  ?   Heart sounds: Normal heart sounds.  ?Pulmonary:  ?   Effort: Pulmonary effort is normal.  ?   Breath sounds: Normal breath sounds. No wheezing or rales.  ?Abdominal:  ?   General: Abdomen is flat. Bowel sounds are normal. There is no distension.  ?   Palpations: Abdomen is soft.  ?   Tenderness: There is no abdominal tenderness.  ?Skin: ?   General: Skin is warm and dry.  ?Neurological:  ?   Mental Status: She is oriented to person, place,  and time.  ?  ? ?Patient Lines/Drains/Airways Status   ? ? Active Line/Drains/Airways   ? ? Name Placement date Placement time Site Days  ? Peripheral IV 04/22/21 20 G 1.88" Anterior;Right Arm 04/22/21  2240  Arm  1  ? Incision (Closed) 09/10/20 Abdomen Other (Comment) 09/10/20  1028  -- 225  ? Incision - 4 Ports Abdomen 1: Umbilicus 2: Mid;Upper 3: Right;Lateral 4: Right;Lower 09/10/20  --  -- 225  ? ?  ?  ? ?  ? ? ?Pertinent Labs: ? ?  Latest Ref Rng & Units 04/24/2021  ?  3:13 AM 04/23/2021  ? 11:12 AM 04/23/2021  ?  4:20 AM  ?CBC  ?WBC 4.0 - 10.5 K/uL 9.5    8.8    ?Hemoglobin 12.0 - 15.0 g/dL 13.2   13.6   11.9    ?Hematocrit 36.0 - 46.0 % 38.7   40.0   36.8    ?Platelets 150 - 400 K/uL 513    435    ? ? ? ?  Latest Ref Rng & Units 04/24/2021  ?  6:55 AM 04/24/2021  ?  3:13 AM 04/24/2021  ? 12:00  AM  ?CMP  ?Glucose 70 - 99 mg/dL 192   161     ?BUN 6 - 20 mg/dL 56   59     ?Creatinine 0.44 - 1.00 mg/dL 10.37   10.63     ?Sodium 135 - 145 mmol/L 135   137     ?Potassium 3.5 - 5.1 mmol/L 3.9   3.3   3.9    ?Chloride 98 - 111 mmol/L 99   102     ?CO2 22 - 32 mmol/L 22   21     ?Calcium 8.9 - 10.3 mg/dL 9.8   10.2     ? ? ?Recent Labs  ?  04/24/21 ?0717 04/24/21 ?0856 04/24/21 ?1009  ?GLUCAP 206* 268* 221*  ?  ? ?Pertinent Imaging: ?No results found. ? ?ASSESSMENT/PLAN:  ?Assessment: ?Principal Problem: ?  Nausea and vomiting ?Active Problems: ?  Diabetic retinopathy ([place]) ?  Chronic hypertension ?  ESRD (end stage renal disease) on PD ?  Gastroparesis ?  DKA (diabetic ketoacidosis) ([place]) ? ? ?Barbara Donovan is a 31 y.o. with a pertinent PMH of type 1 diabetes mellitus complicated by gastroparesis and retinopathy, diabetic nephropathy with ESRD on PD, hypertension, who presented with nausea and vomiting and admitted for DKA.  ? ?Plan: ?#DKA ?#Type 1 diabetes mellitus ?#Intractable nausea and vomiting ?#Gastroparesis ?Patient was taken off insulin drip this morning.  Anion gap at 17 at 10:15 AM.  Insulin drip was restarted at  that time.  We will schedule BMP repeat in 4 hours.  Anion gap will have to reclose on repeat BMP, then patient can be transitioned off of Endo tool.  Long-acting insulin will need to be started at that time, will plan to start reduced dose from home at 15 units of Lantus.  Once she is off of Endo tool will adjust fluids.  If she is tolerating p.o. intake can discontinue D5 half-normal saline with K.  If she is unable to tolerate p.o. intake we will continue D5 half-normal saline discontinue potassium additive. ? ?-BMP every 4 hours ?-Metoclopramide 5 mg daily as needed ?-Tigan 100 mg once ?-qt prolongation limits zofran use ? ?#Hypoxia- resolved ?Patient has been on room air with stable oxygen saturations. ? 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 ? ?#Hypertension ?Blood pressure elevated yesterday and overnight into 170s over 90s. She was able to tolerate PO medications this morning. ? ?-Continue amlodipine 10 mg, carvedilol 25 mg twice daily, and hydralazine 50 3 times daily ? ? ?Best Practice: ?Diet: Renal diet ?IVF: Fluids: D5-0.45NS w KCl, Rate:  65 cc/hr x 12 hrs ?VTE: heparin injection 5,000 Units Start: 04/22/21 2230 ?Code: Full ?AB: none ?DISPO: Anticipated discharge in 2-3 days to Home pending Medical stability. ? ?Signature: ?Daleen Bo. Madisan Bice, D.O.  ?Internal Medicine Resident, PGY-1 ?[facility]  ?Pager: [phone] ?11:10 AM, 04/24/2021  ? ?Please contact the on call pager after 5 pm and on weekends at [phone].  ?

## 2021-04-25 LAB — BASIC METABOLIC PANEL
Anion gap: 14 (ref 5–15)
Anion gap: 16 — ABNORMAL HIGH (ref 5–15)
Anion gap: 12 (ref 5–15)
Anion gap: 12 (ref 5–15)
Anion gap: 13 (ref 5–15)
BUN: 49 mg/dL — ABNORMAL HIGH (ref 6–20)
BUN: 48 mg/dL — ABNORMAL HIGH (ref 6–20)
BUN: 48 mg/dL — ABNORMAL HIGH (ref 6–20)
BUN: 48 mg/dL — ABNORMAL HIGH (ref 6–20)
BUN: 49 mg/dL — ABNORMAL HIGH (ref 6–20)
CO2: 21 mmol/L — ABNORMAL LOW (ref 22–32)
CO2: 17 mmol/L — ABNORMAL LOW (ref 22–32)
CO2: 21 mmol/L — ABNORMAL LOW (ref 22–32)
CO2: 23 mmol/L (ref 22–32)
CO2: 22 mmol/L (ref 22–32)
Calcium: 9.6 mg/dL (ref 8.9–10.3)
Calcium: 9.4 mg/dL (ref 8.9–10.3)
Calcium: 9.1 mg/dL (ref 8.9–10.3)
Calcium: 9.4 mg/dL (ref 8.9–10.3)
Calcium: 9.6 mg/dL (ref 8.9–10.3)
Chloride: 96 mmol/L — ABNORMAL LOW (ref 98–111)
Chloride: 99 mmol/L (ref 98–111)
Chloride: 97 mmol/L — ABNORMAL LOW (ref 98–111)
Chloride: 95 mmol/L — ABNORMAL LOW (ref 98–111)
Chloride: 94 mmol/L — ABNORMAL LOW (ref 98–111)
Creatinine, Ser: 9.84 mg/dL — ABNORMAL HIGH (ref 0.44–1.00)
Creatinine, Ser: 9.68 mg/dL — ABNORMAL HIGH (ref 0.44–1.00)
Creatinine, Ser: 9.62 mg/dL — ABNORMAL HIGH (ref 0.44–1.00)
Creatinine, Ser: 9.87 mg/dL — ABNORMAL HIGH (ref 0.44–1.00)
Creatinine, Ser: 9.89 mg/dL — ABNORMAL HIGH (ref 0.44–1.00)
GFR, Estimated: 5 mL/min — ABNORMAL LOW (ref >60)
GFR, Estimated: 5 mL/min — ABNORMAL LOW (ref >60)
GFR, Estimated: 5 mL/min — ABNORMAL LOW (ref >60)
GFR, Estimated: 5 mL/min — ABNORMAL LOW (ref >60)
GFR, Estimated: 5 mL/min — ABNORMAL LOW (ref >60)
Glucose, Bld: 255 mg/dL — ABNORMAL HIGH (ref 70–99)
Glucose, Bld: 234 mg/dL — ABNORMAL HIGH (ref 70–99)
Glucose, Bld: 228 mg/dL — ABNORMAL HIGH (ref 70–99)
Glucose, Bld: 208 mg/dL — ABNORMAL HIGH (ref 70–99)
Glucose, Bld: 182 mg/dL — ABNORMAL HIGH (ref 70–99)
Potassium: 3.8 mmol/L (ref 3.5–5.1)
Potassium: 5.1 mmol/L (ref 3.5–5.1)
Potassium: 3.8 mmol/L (ref 3.5–5.1)
Potassium: 4 mmol/L (ref 3.5–5.1)
Potassium: 3.8 mmol/L (ref 3.5–5.1)
Sodium: 131 mmol/L — ABNORMAL LOW (ref 135–145)
Sodium: 132 mmol/L — ABNORMAL LOW (ref 135–145)
Sodium: 130 mmol/L — ABNORMAL LOW (ref 135–145)
Sodium: 130 mmol/L — ABNORMAL LOW (ref 135–145)
Sodium: 129 mmol/L — ABNORMAL LOW (ref 135–145)

## 2021-04-25 LAB — GLUCOSE, CAPILLARY
Glucose-Capillary: 241 mg/dL — ABNORMAL HIGH (ref 70–99)
Glucose-Capillary: 246 mg/dL — ABNORMAL HIGH (ref 70–99)
Glucose-Capillary: 217 mg/dL — ABNORMAL HIGH (ref 70–99)
Glucose-Capillary: 194 mg/dL — ABNORMAL HIGH (ref 70–99)
Glucose-Capillary: 190 mg/dL — ABNORMAL HIGH (ref 70–99)

## 2021-04-25 LAB — CBC
HCT: 41.6 % (ref 36.0–46.0)
Hemoglobin: 13.5 g/dL (ref 12.0–15.0)
MCH: 28.2 pg (ref 26.0–34.0)
MCHC: 32.5 g/dL (ref 30.0–36.0)
MCV: 86.8 fL (ref 80.0–100.0)
Platelets: 522 10*3/uL — ABNORMAL HIGH (ref 150–400)
RBC: 4.79 MIL/uL (ref 3.87–5.11)
RDW: 14.2 % (ref 11.5–15.5)
WBC: 9.4 10*3/uL (ref 4.0–10.5)
nRBC: 0 % (ref 0.0–0.2)

## 2021-04-25 MED ORDER — SODIUM CHLORIDE 0.9 % IV SOLN
6.2500 mg | Freq: Four times a day (QID) | INTRAVENOUS | Status: DC | PRN
  Administered 2021-04-25 – 2021-04-26 (×3): 6.25 mg via INTRAVENOUS
  Filled 2021-04-25 (×5): qty 0.25

## 2021-04-25 MED ORDER — SEVELAMER CARBONATE 800 MG PO TABS
800.0000 mg | ORAL_TABLET | Freq: Three times a day (TID) | ORAL | Status: DC
  Administered 2021-04-25 – 2021-04-26 (×4): 800 mg via ORAL
  Filled 2021-04-25 (×5): qty 1

## 2021-04-25 MED ORDER — LACTATED RINGERS IV SOLN: INTRAVENOUS | Status: DC

## 2021-04-25 MED ORDER — NEPRO/CARBSTEADY PO LIQD
237.0000 mL | Freq: Two times a day (BID) | ORAL | Status: DC
Start: 2021-04-25 — End: 2021-04-26
  Administered 2021-04-25: 237 mL via ORAL

## 2021-04-25 MED ORDER — INSULIN GLARGINE-YFGN 100 UNIT/ML ~~LOC~~ SOLN
20.0000 [IU] | Freq: Every day | SUBCUTANEOUS | Status: DC
  Administered 2021-04-25 – 2021-04-26 (×2): 20 [IU] via SUBCUTANEOUS
  Filled 2021-04-25 (×2): qty 0.2

## 2021-04-25 MED ORDER — RENA-VITE PO TABS
1.0000 | ORAL_TABLET | Freq: Every day | ORAL | Status: DC
  Administered 2021-04-25: 1 via ORAL
  Filled 2021-04-25: qty 1

## 2021-04-25 NOTE — Progress Notes (Signed)
?[facility] ?Progress Note  ? ?Subjective:  ?Seen in room. Feeling better, nausea improving. No abdominal pain. No issues with PD. UF 1207 mL  ? ?Objective ?Vitals:  ? 04/24/21 1728 04/24/21 1955 04/25/21 0444 04/25/21 4970  ?BP: (!) 149/96 117/76 123/84   ?Pulse:  84 84   ?Resp: 17 14 17    ?Temp:  98.3 ?F (36.8 ?C) 98.4 ?F (36.9 ?C) 98.4 ?F (36.9 ?C)  ?TempSrc:  Oral Oral Oral  ?SpO2:  99% 100%   ?Weight:      ?Height:      ?  ? ? ?Additional Objective ?Labs: ?Basic Metabolic Panel: ?Recent Labs  ?Lab 04/23/21 ?1433 04/23/21 ?1511 04/24/21 ?2102 04/25/21 ?0308 04/25/21 ?0715  ?NA  --    < > 131* 131* 132*  ?K  --    < > 4.9 3.8 5.1  ?CL  --    < > 99 96* 99  ?CO2  --    < > 18* 21* 17*  ?GLUCOSE  --    < > 190* 255* 234*  ?BUN  --    < > 55* 49* 48*  ?CREATININE  --    < > 10.14* 9.84* 9.68*  ?CALCIUM  --    < > 9.1 9.6 9.4  ?PHOS 6.5*  --   --   --   --   ? < > = values in this interval not displayed.  ? ? ?CBC: ?Recent Labs  ?Lab 04/22/21 ?1659 04/22/21 ?2637 04/23/21 ?8588 04/23/21 ?1112 04/24/21 ?5027 04/25/21 ?7412  ?WBC 7.9  --  8.8  --  9.5 9.4  ?NEUTROABS 6.7  --   --   --   --   --   ?HGB 13.7   < > 11.9* 13.6 13.2 13.5  ?HCT 41.7   < > 36.8 40.0 38.7 41.6  ?MCV 86.5  --  88.7  --  84.1 86.8  ?PLT 515*  --  435*  --  513* 522*  ? < > = values in this interval not displayed.  ? ? ?Blood Culture ?   ?Component Value Date/Time  ? SDES FLUID 02/04/2021 1404  ? [facility] 02/04/2021 1404  ? CULT  02/04/2021 1404  ?  NO GROWTH 3 DAYS ?Performed at [facility], [place] [address], [place], [place] [address] ?  ? REPTSTATUS 02/08/2021 FINAL 02/04/2021 1404  ? ? ? ?Physical Exam ?General: Chronically ill appearing, nad  ?Heart: RRR No m,r, g  ?Lungs: Clear bilaterally  ?Abdomen: soft non-tender  ?Extremities: No LE edema  ?Dialysis Access: PD cath in place  ? ?Medications: ? dialysis solution 1.5% low-MG/low-CA    ? lactated ringers 50 mL/hr at 04/25/21 0924  ? promethazine  (PHENERGAN) injection (IM or IVPB)    ? ? amLODipine  10 mg Oral Daily  ? carvedilol  25 mg Oral BID WC  ? gentamicin cream  1 application. Topical Daily  ? heparin  5,000 Units Subcutaneous Q8H  ? hydrALAZINE  50 mg Oral TID  ? insulin aspart  0-6 Units Subcutaneous TID WC  ? insulin glargine-yfgn  20 Units Subcutaneous Daily  ? ? ?Dialysis Orders:  ?7d per week, 5 exchanges x 2000 each, 10L total. 1.5h dwells. 63kg dry wt. No daybag ? ?Assessment/Plan: ?DKA - per primary team  ?Volume - w/ ongoing N/V. Got IVF bolus on admission. Appears euvolemic.  ?HTN - BP back up. On amlodipine/hydralazine  ?ESRD - on CCPD.  Continue PD tonight w/  all 1.5% fluids.  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 Phos 6.5. - Cont binder.  ? ?Barbara Child PA-C ?[facility] ?04/25/2021,9:40 AM ? ? ? ? ? ? ? ?

## 2021-04-25 NOTE — Progress Notes (Signed)
Initial Nutrition Assessment ? ?DOCUMENTATION CODES:  ? ?Not applicable ? ?INTERVENTION:  ? ?-Nepro Shake po BID, each supplement provides 425 kcal and 19 grams protein  ?-Renal MVI daily ? ?NUTRITION DIAGNOSIS:  ? ?Increased nutrient needs related to chronic illness (ESRD on PD) as evidenced by estimated needs. ? ?GOAL:  ? ?Patient will meet greater than or equal to 90% of their needs ? ?MONITOR:  ? ?PO intake, Supplement acceptance, Labs, Weight trends, Skin, I & O's ? ?REASON FOR ASSESSMENT:  ? ?Malnutrition Screening Tool ?  ? ?ASSESSMENT:  ? ?Barbara Donovan is a 31 y.o. with a pertinent PMH of type 1 diabetes mellitus complicated by gastroparesis and retinopathy, diabetic nephropathy with ESRD on PD, hypertension, who presented with nausea and vomiting and admitted for DKA. ? ?Pt admitted with nausea and vomiting secondary to DKA.  ? ?Reviewed I/O's: +1.5 L x 24 hours and +2.4 L since admission ? ?Emesis: 100 ml x 24 hours ? ?Pt unavailable at time of visit. Attempted to speak with pt via call to hospital room phone, however, unable to reach. RD unable to obtain further nutrition-related history or complete nutrition-focused physical exam at this time ? ?Pt with improving appetite since admission. She was able to eat some oatmeal for breakfast per MD. Noted meal completions 0-50%.  ? ?Reviewed wt hx; wt has been stable over the past 3 months. ? ?Medications reviewed and include renvela. ? ?Lab Results  ?Component Value Date  ? HGBA1C 7.6 (H) 03/25/2021  ? PTA DM medications are SSI insulin lispro TID and 20 units insulin glargine daily.  ? ?Labs reviewed: Na: 130, Phos: 6.5, Mg: 2.5,  CBGS: 217 (inpatient orders for glycemic control are 0-6 units insulin aspart TID with meals and 20 units insulin glargine-yfgn daily).   ? ?Diet Order:   ?Diet Order   ? ?       ?  Diet renal/carb modified with fluid restriction Diet-HS Snack? Nothing; Fluid restriction: 1200 mL Fluid; Room service appropriate? Yes; Fluid  consistency: Thin  Diet effective now       ?  ? ?  ?  ? ?  ? ? ?EDUCATION NEEDS:  ? ?No education needs have been identified at this time ? ?Skin:  Skin Assessment: Reviewed RN Assessment ? ?Last BM:  Unknown ? ?Height:  ? ?Ht Readings from Last 1 Encounters:  ?04/23/21 5\' 6"  (1.676 m)  ? ? ?Weight:  ? ?Wt Readings from Last 1 Encounters:  ?04/24/21 64 kg  ? ? ?Ideal Body Weight:  59.1 kg ? ?BMI:  Body mass index is 22.77 kg/m?. ? ?Estimated Nutritional Needs:  ? ?Kcal:  1750-1950 ? ?Protein:  90-105 grams ? ?Fluid:  1000 ml + UOP ? ? ? ?Barbara Donovan, RD, LDN, CDCES ?Registered Dietitian II ?Certified Diabetes Care and Education Specialist ?Please refer to [facility] for RD and/or RD on-call/weekend/after hours pager  ?

## 2021-04-25 NOTE — Progress Notes (Addendum)
? ? ?HD#2 ?Subjective:  ?Overnight Events: Transitioned off of IV insulin ? ? Resting in bed comfortably.  Feels better this morning with less nausea and vomiting.  She has been able to tolerate oatmeal this morning.  States in the past Phenergan has helped significantly with her nausea and vomiting, will transition her over to this. ? ?Objective:  ?Vital signs in last 24 hours: ?Vitals:  ? 04/24/21 1728 04/24/21 1955 04/25/21 0444 04/25/21 0823  ?BP: (!) 149/96 117/76 123/84   ?Pulse:  84 84   ?Resp: 17 14 17    ?Temp:  98.3 ?F (36.8 ?C) 98.4 ?F (36.9 ?C) 98.4 ?F (36.9 ?C)  ?TempSrc:  Oral Oral Oral  ?SpO2:  99% 100%   ?Weight:      ?Height:      ? ?Supplemental O2: Room Air ?SpO2: 100 % ?O2 Flow Rate (L/min): 2 L/min ? ? ?Physical Exam:  ?Constitutional: Ill-appearing ?HENT: normocephalic atraumatic ?Eyes: conjunctiva non-erythematous ?Neck: supple ?Cardiovascular: Tachycardic, no murmurs gallops or rubs ?Pulmonary/Chest: normal work of breathing on room air, lungs clear to auscultation bilaterally ?Abdominal: soft, non-tender, non-distended ?MSK: normal bulk and tone ?Neurological: alert & oriented x 3 ?Skin: warm and dry ?Psych: Normal mood and thought process ? ?Filed Weights  ? 04/23/21 1510 04/23/21 1812 04/24/21 0411  ?Weight: 62.6 kg 62.6 kg 64 kg  ? ? ? ?Intake/Output Summary (Last 24 hours) at 04/25/2021 1024 ?Last data filed at 04/25/2021 7322 ?Gross per 24 hour  ?Intake 11604.86 ml  ?Output 11309 ml  ?Net 295.86 ml  ? ?Net IO Since Admission: 1,220 mL [04/25/21 1024] ? ?Pertinent Labs: ? ?  Latest Ref Rng & Units 04/25/2021  ?  9:04 AM 04/24/2021  ?  3:13 AM 04/23/2021  ? 11:12 AM  ?CBC  ?WBC 4.0 - 10.5 K/uL 9.4   9.5     ?Hemoglobin 12.0 - 15.0 g/dL 13.5   13.2   13.6    ?Hematocrit 36.0 - 46.0 % 41.6   38.7   40.0    ?Platelets 150 - 400 K/uL 522   513     ? ? ? ?  Latest Ref Rng & Units 04/25/2021  ?  7:15 AM 04/25/2021  ?  3:08 AM 04/24/2021  ?  9:02 PM  ?CMP  ?Glucose 70 - 99 mg/dL 234   255   190    ?BUN 6 - 20  mg/dL 48   49   55    ?Creatinine 0.44 - 1.00 mg/dL 9.68   9.84   10.14    ?Sodium 135 - 145 mmol/L 132   131   131    ?Potassium 3.5 - 5.1 mmol/L 5.1   3.8   4.9    ?Chloride 98 - 111 mmol/L 99   96   99    ?CO2 22 - 32 mmol/L 17   21   18     ?Calcium 8.9 - 10.3 mg/dL 9.4   9.6   9.1    ? ? ?Imaging: ?No results found. ? ?Assessment/Plan:  ? ?Principal Problem: ?  Nausea and vomiting ?Active Problems: ?  Diabetic retinopathy ([place]) ?  Chronic hypertension ?  ESRD (end stage renal disease) on PD ?  Gastroparesis ?  DKA (diabetic ketoacidosis) ([place]) ? ? ?Patient Summary: ?Barbara Donovan is a 31 y.o. with a pertinent PMH of type 1 diabetes mellitus complicated by gastroparesis and retinopathy, diabetic nephropathy with ESRD on PD, hypertension, who presented with nausea and vomiting and admitted for  DKA.  ? ?DKA 2/2 nausea and vomiting ?Type 1 diabetes mellitus ?Intractable nausea and vomiting ?Gastroparesis ?Transitioned off of insulin drip early this morning due to closed gap x4, however her gap has reopened to 16. If still open with afternoon BMP will restart insulin drip. She is eating and her nausea has improved. Hopeful that we will be able to close her gap without putting her back on IV insulin.  We will increase long-acting to her home dose of 20 units.  We have also added mealtime 0-6 sliding scale.  Does not appear as though she is on mealtime insulin at home.  We will continue with every 4 hours BMPs and continue to monitor her potassium and anion gap ?-Long-acting insulin, 20 units daily.  Sensitive sliding scale with meals. ?-continue IV fluilds, 50 ccs/hr ?-BMP every 4 hours, monitor K ?-if back on insulin drip, stop for K below 3.3 ?-Phenergan daily as needed ?-qt prolongation limits zofran use ?  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 ?  ?Hypertension ?BP elevated while was in the room, given amlodipine, carvedilol, hydralazine early this  morning. ?-Continue amlodipine 10 mg, carvedilol 25 mg twice daily, and hydralazine 50 3 times daily ? ?Diet: Renal ?IVF: LR,50cc/hr ?VTE: Heparin ?Code: Full ?PT/OT recs: None,  ? ?Dispo: Anticipated discharge to Home in 1 to 2 days pending resolution of her DKA ? ?Vasili Dmitri Pettigrew DO ?Internal Medicine Resident PGY-2 ?Pager [phone] ?Please contact the on call pager after 5 pm and on weekends at [phone].  ?

## 2021-04-26 DIAGNOSIS — E1010 Type 1 diabetes mellitus with ketoacidosis without coma: Principal | ICD-10-CM

## 2021-04-26 LAB — CBC WITH DIFFERENTIAL/PLATELET
Abs Immature Granulocytes: 0.08 K/uL — ABNORMAL HIGH (ref 0.00–0.07)
Basophils Absolute: 0.1 K/uL (ref 0.0–0.1)
Basophils Relative: 1 %
Eosinophils Absolute: 0 K/uL (ref 0.0–0.5)
Eosinophils Relative: 0 %
HCT: 39.7 % (ref 36.0–46.0)
Hemoglobin: 13.3 g/dL (ref 12.0–15.0)
Immature Granulocytes: 1 %
Lymphocytes Relative: 26 %
Lymphs Abs: 2.4 K/uL (ref 0.7–4.0)
MCH: 29.2 pg (ref 26.0–34.0)
MCHC: 33.5 g/dL (ref 30.0–36.0)
MCV: 87.1 fL (ref 80.0–100.0)
Monocytes Absolute: 0.5 K/uL (ref 0.1–1.0)
Monocytes Relative: 5 %
Neutro Abs: 6.2 K/uL (ref 1.7–7.7)
Neutrophils Relative %: 67 %
Platelets: 460 K/uL — ABNORMAL HIGH (ref 150–400)
RBC: 4.56 MIL/uL (ref 3.87–5.11)
RDW: 13.8 % (ref 11.5–15.5)
WBC: 9.2 K/uL (ref 4.0–10.5)
nRBC: 0 % (ref 0.0–0.2)

## 2021-04-26 LAB — BASIC METABOLIC PANEL
Anion gap: 14 (ref 5–15)
Anion gap: 13 (ref 5–15)
BUN: 52 mg/dL — ABNORMAL HIGH (ref 6–20)
BUN: 50 mg/dL — ABNORMAL HIGH (ref 6–20)
CO2: 21 mmol/L — ABNORMAL LOW (ref 22–32)
CO2: 22 mmol/L (ref 22–32)
Calcium: 9.3 mg/dL (ref 8.9–10.3)
Calcium: 9.7 mg/dL (ref 8.9–10.3)
Chloride: 95 mmol/L — ABNORMAL LOW (ref 98–111)
Chloride: 95 mmol/L — ABNORMAL LOW (ref 98–111)
Creatinine, Ser: 10.09 mg/dL — ABNORMAL HIGH (ref 0.44–1.00)
Creatinine, Ser: 10.25 mg/dL — ABNORMAL HIGH (ref 0.44–1.00)
GFR, Estimated: 5 mL/min — ABNORMAL LOW (ref >60)
GFR, Estimated: 5 mL/min — ABNORMAL LOW (ref >60)
Glucose, Bld: 142 mg/dL — ABNORMAL HIGH (ref 70–99)
Glucose, Bld: 198 mg/dL — ABNORMAL HIGH (ref 70–99)
Potassium: 3.6 mmol/L (ref 3.5–5.1)
Potassium: 3.9 mmol/L (ref 3.5–5.1)
Sodium: 130 mmol/L — ABNORMAL LOW (ref 135–145)
Sodium: 130 mmol/L — ABNORMAL LOW (ref 135–145)

## 2021-04-26 LAB — GLUCOSE, CAPILLARY
Glucose-Capillary: 169 mg/dL — ABNORMAL HIGH (ref 70–99)
Glucose-Capillary: 208 mg/dL — ABNORMAL HIGH (ref 70–99)

## 2021-04-26 LAB — BASIC METABOLIC PANEL WITH GFR
Anion gap: 14 (ref 5–15)
Anion gap: 14 (ref 5–15)
BUN: 50 mg/dL — ABNORMAL HIGH (ref 6–20)
BUN: 50 mg/dL — ABNORMAL HIGH (ref 6–20)
CO2: 21 mmol/L — ABNORMAL LOW (ref 22–32)
CO2: 22 mmol/L (ref 22–32)
Calcium: 9.3 mg/dL (ref 8.9–10.3)
Calcium: 9.6 mg/dL (ref 8.9–10.3)
Chloride: 93 mmol/L — ABNORMAL LOW (ref 98–111)
Chloride: 94 mmol/L — ABNORMAL LOW (ref 98–111)
Creatinine, Ser: 9.91 mg/dL — ABNORMAL HIGH (ref 0.44–1.00)
Creatinine, Ser: 10.19 mg/dL — ABNORMAL HIGH (ref 0.44–1.00)
GFR, Estimated: 5 mL/min — ABNORMAL LOW
GFR, Estimated: 5 mL/min — ABNORMAL LOW
Glucose, Bld: 183 mg/dL — ABNORMAL HIGH (ref 70–99)
Glucose, Bld: 168 mg/dL — ABNORMAL HIGH (ref 70–99)
Potassium: 3.6 mmol/L (ref 3.5–5.1)
Potassium: 4.1 mmol/L (ref 3.5–5.1)
Sodium: 128 mmol/L — ABNORMAL LOW (ref 135–145)
Sodium: 130 mmol/L — ABNORMAL LOW (ref 135–145)

## 2021-04-26 MED ORDER — INSULIN ASPART 100 UNIT/ML IJ SOLN
3.0000 [IU] | Freq: Three times a day (TID) | INTRAMUSCULAR | Status: DC
  Administered 2021-04-26: 3 [IU] via SUBCUTANEOUS

## 2021-04-26 MED ORDER — METOCLOPRAMIDE HCL 5 MG PO TABS
5.0000 mg | ORAL_TABLET | Freq: Three times a day (TID) | ORAL | Status: DC
  Administered 2021-04-26 (×2): 5 mg via ORAL
  Filled 2021-04-26 (×2): qty 1

## 2021-04-26 MED ORDER — PROMETHAZINE HCL 12.5 MG PO TABS: 12.5000 mg | ORAL_TABLET | Freq: Four times a day (QID) | ORAL | 0 refills | Status: DC | PRN

## 2021-04-26 NOTE — Progress Notes (Signed)
?[facility] ?Progress Note  ? ?Subjective:  ?Seen in room. Feels better, eating breakfast. No n/v this am. Says she's going home today  ?PD not done last night for unclear reasons  ? ?Objective ?Vitals:  ? 04/25/21 2110 04/25/21 2349 04/26/21 0336 04/26/21 0353  ?BP: (!) 152/95 (!) 152/97 (!) 152/105   ?Pulse:  97 100   ?Resp:  13 10   ?Temp:  98.6 ?F (37 ?C) 99.2 ?F (37.3 ?C)   ?TempSrc:  Oral Oral   ?SpO2:  100% 100%   ?Weight:    62.9 kg  ?Height:      ?  ? ? ?Additional Objective ?Labs: ?Basic Metabolic Panel: ?Recent Labs  ?Lab 04/23/21 ?1433 04/23/21 ?1511 04/26/21 ?0020 04/26/21 ?5809 04/26/21 ?9833  ?NA  --    < > 128* 130* 130*  ?K  --    < > 3.6 4.1 3.6  ?CL  --    < > 93* 94* 95*  ?CO2  --    < > 21* 22 21*  ?GLUCOSE  --    < > 183* 168* 142*  ?BUN  --    < > 50* 50* 52*  ?CREATININE  --    < > 9.91* 10.19* 10.09*  ?CALCIUM  --    < > 9.3 9.6 9.3  ?PHOS 6.5*  --   --   --   --   ? < > = values in this interval not displayed.  ? ? ?CBC: ?Recent Labs  ?Lab 04/22/21 ?1659 04/22/21 ?8250 04/23/21 ?5397 04/23/21 ?1112 04/24/21 ?6734 04/25/21 ?1937 04/26/21 ?9024  ?WBC 7.9  --  8.8  --  9.5 9.4 9.2  ?NEUTROABS 6.7  --   --   --   --   --  6.2  ?HGB 13.7   < > 11.9*   < > 13.2 13.5 13.3  ?HCT 41.7   < > 36.8   < > 38.7 41.6 39.7  ?MCV 86.5  --  88.7  --  84.1 86.8 87.1  ?PLT 515*  --  435*  --  513* 522* 460*  ? < > = values in this interval not displayed.  ? ? ?Blood Culture ?   ?Component Value Date/Time  ? SDES FLUID 02/04/2021 1404  ? [facility] 02/04/2021 1404  ? CULT  02/04/2021 1404  ?  NO GROWTH 3 DAYS ?Performed at [facility], [place] [address] [place], [place] [address] ?  ? REPTSTATUS 02/08/2021 FINAL 02/04/2021 1404  ? ? ? ?Physical Exam ?General: Chronically ill appearing, nad  ?Heart: RRR No m,r, g  ?Lungs: Clear bilaterally  ?Abdomen: soft non-tender  ?Extremities: No LE edema  ?Dialysis Access: PD cath in place  ? ?Medications: ? dialysis solution 1.5%  low-MG/low-CA    ? promethazine (PHENERGAN) injection (IM or IVPB) 6.25 mg (04/26/21 0526)  ? ? amLODipine  10 mg Oral Daily  ? carvedilol  25 mg Oral BID WC  ? feeding supplement (NEPRO CARB STEADY)  237 mL Oral BID BM  ? gentamicin cream  1 application. Topical Daily  ? heparin  5,000 Units Subcutaneous Q8H  ? hydrALAZINE  50 mg Oral TID  ? insulin aspart  3 Units Subcutaneous TID WC  ? insulin glargine-yfgn  20 Units Subcutaneous Daily  ? metoCLOPramide  5 mg Oral TID AC  ? multivitamin  1 tablet Oral QHS  ? sevelamer carbonate  800 mg Oral TID WC  ? ? ?Dialysis Orders:  ?  7d per week, 5 exchanges x 2000 each, 10L total. 1.5h dwells. 63kg dry wt. No daybag ? ?Assessment/Plan: ?DKA - per primary team. AG 14.  ?Volume - w/ ongoing N/V. S/p IVF bolus. Appears euvolemic.  ?HTN - BP acceptable. On amlodipine/hydralazine  ?ESRD - on CCPD.  Continue PD tonight w/ all 1.5% fluids. Can resume at home if discharging today.   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 Phos 6.5. - Cont binder.  ? ?Lynnda Child PA-C ?[facility] ?04/26/2021,10:26 AM ? ? ? ? ? ? ? ?

## 2021-04-26 NOTE — Discharge Summary (Addendum)
? ?Name: Barbara Donovan ?MRN: [id number] ?DOB: 1990-08-10 31 y.o. ?PCP: Scarlett Presto, MD ? ?Date of Admission: 04/22/2021  4:39 PM ?Date of Discharge: 04/26/21 ?Attending Physician: Dr.  Saverio Danker ? ?Discharge Diagnosis: ?Principal Problem: ?  Nausea and vomiting ?Active Problems: ?  Diabetic retinopathy ([place]) ?  Chronic hypertension ?  ESRD (end stage renal disease) on PD ?  Gastroparesis ?  DKA (diabetic ketoacidosis) ([place]) ?  ? ?Discharge Medications: ?Allergies as of 04/26/2021   ?No Known Allergies ?  ? ?  ?Medication List  ?  ? ?STOP taking these medications   ? ?promethazine 25 MG suppository ?Commonly known as: PHENERGAN ?Replaced by: promethazine 12.5 MG tablet ?  ? ?  ? ?TAKE these medications   ? ?amLODipine 10 MG tablet ?Commonly known as: NORVASC ?Take 1 tablet (10 mg total) by mouth daily. ?What changed: when to take this ?  ?brimonidine 0.2 % ophthalmic solution ?Commonly known as: ALPHAGAN ?Place 1 drop into the left eye 3 (three) times daily. ?  ?calcitRIOL 0.5 MCG capsule ?Commonly known as: ROCALTROL ?Take 0.5 mcg by mouth daily. ?  ?carvedilol 25 MG tablet ?Commonly known as: COREG ?Take 1 tablet (25 mg total) by mouth 2 (two) times daily. ?  ?Continuous Blood Gluc Receiver Devi ?1 Units by Does not apply route 4 (four) times daily -  before meals and at bedtime. May substitute for cheapest monitor with insurance ?  ?Continuous Blood Gluc Sensor Misc ?1 each by Does not apply route as directed. Use as directed every 14 days. May dispense FreeStyle [employer] or similar. ?  ?Continuous Glucose Monitor Sup Misc ?1 Units by Does not apply route 4 (four) times daily -  before meals and at bedtime. May substitute for cheapest option with patient insurance ?  ?FLUoxetine 20 MG capsule ?Commonly known as: PROZAC ?Take 20 mg by mouth daily. ?  ?hydrALAZINE 50 MG tablet ?Commonly known as: APRESOLINE ?Take 1 tablet (50 mg total) by mouth 3 (three) times daily. ?What changed: when to take this ?  ?insulin  lispro 100 UNIT/ML KwikPen ?Commonly known as: HumaLOG KwikPen ?3 times a day (just before each meal) 4-3-3 units, and pen needles 4/day ?  ?Lantus SoloStar 100 UNIT/ML Solostar Pen ?Generic drug: insulin glargine ?Inject 20 Units into the skin every morning. ?  ?metoCLOPramide 5 MG tablet ?Commonly known as: REGLAN ?Take 1 tablet (5 mg total) by mouth 3 (three) times daily before meals. ?  ?multivitamin Tabs tablet ?Take 1 tablet by mouth at bedtime. ?  ?ondansetron 4 MG disintegrating tablet ?Commonly known as: ZOFRAN-ODT ?Take 1 tablet (4 mg total) by mouth every 6 (six) hours as needed for nausea or vomiting. ?  ?OneTouch Verio test strip ?Generic drug: glucose blood ?1 each by Other route 3 (three) times daily. And lancets 3/day ?  ?FREESTYLE TEST STRIPS test strip ?Generic drug: glucose blood ?Use as instructed ?  ?promethazine 12.5 MG tablet ?Commonly known as: PHENERGAN ?Take 1 tablet (12.5 mg total) by mouth every 6 (six) hours as needed for nausea or vomiting. ?Replaces: promethazine 25 MG suppository ?  ?sevelamer carbonate 800 MG tablet ?Commonly known as: RENVELA ?Take 1 tablet (800 mg total) by mouth 3 (three) times daily with meals. ?  ? ?  ? ?  ?  ? ? ?  ?Discharge Care Instructions  ?(From admission, onward)  ?  ? ? ?  ? ?  Start     Ordered  ? 04/26/21 0000  Leave  dressing on - Keep it clean, dry, and intact until clinic visit       ? 04/26/21 1638  ? ?  ?  ? ?  ? ? ?Disposition and follow-up:   ?Barbara Donovan was discharged from [facility] in Stable condition.  At the hospital follow up visit please address: ? ?1.  Follow-up: ? a.  DKA - Continued on home insulin. Follow up BMP and with endocrinology ?  ? b. N/V - 2/2 gastroparesis - continue reglan and phenergan PRN ? ? c. PD - Home PD follow up with nephrology as needed ? ?2.  Labs / imaging needed at time of follow-up: BMP ? ?3.  Pending labs/ test needing follow-up: None ? ?4.  Medication Changes ? Started: Phenergan  PRN ? Stopped: None ? Changed: None ? Abx - None  End Date: N/A ? ?Follow-up Appointments: ? Follow-up Information   ? ? Scarlett Presto, MD. Call.   ?Specialty: Internal Medicine ?Why: I will reach out to the front desk to call you and follow up in the clinic. ?Contact information: ?[address] ?[place] [address] ?[phone] ? ? ?  ?  ? ? Renato Shin, MD. Schedule an appointment as soon as possible for a visit.   ?Specialty: Endocrinology ?Why: Please call and make an appointment to see Dr. Loanne Drilling this week ?Contact information: ?[address] ?[address] ?[place] [address] ?[phone] ? ? ?  ?  ? ?  ?  ? ?  ? ? ?Hospital Course by problem list: ? ?DKA secondary to nausea and vomiting from gastroparesis ?Initially presented with severe nausea and vomiting likely due to gastroparesis, suspect that this led to poor PO intake and patient presenting with DKA.  She was started on DKA protocol with Endo tool and transition off throughout her hospitalization.  By discharge she was able to eat and drink well and was continue on her home regimen of Lantus 20 units nightly and NovoLog 3 units with meals.  This was continued on discharge.  Patient was able to tolerate a p.o. diet well and will follow up with our clinic this week and with endocrinology. ? ?ESRD on peritoneal dialysis ?Continued PD during her hospitalization.  We will continue on discharge.  Nephrology were following during hospitalization and had no further recommendations.  She will continue to follow with her nephrologist Dr. Osborne Casco. ? ?Pertinent Labs, Studies, and Procedures:  ? ?  Latest Ref Rng & Units 04/26/2021  ?  4:38 AM 04/25/2021  ?  9:04 AM 04/24/2021  ?  3:13 AM  ?CBC  ?WBC 4.0 - 10.5 K/uL 9.2   9.4   9.5    ?Hemoglobin 12.0 - 15.0 g/dL 13.3   13.5   13.2    ?Hematocrit 36.0 - 46.0 % 39.7   41.6   38.7    ?Platelets 150 - 400 K/uL 460   522   513    ? ? ? ?  Latest Ref Rng & Units 04/26/2021  ? 12:03 PM 04/26/2021  ?  7:21 AM  04/26/2021  ?  4:38 AM  ?CMP  ?Glucose 70 - 99 mg/dL 198   142   168    ?BUN 6 - 20 mg/dL 50   52   50    ?Creatinine 0.44 - 1.00 mg/dL 10.25   10.09   10.19    ?Sodium 135 - 145 mmol/L 130   130   130    ?Potassium  3.5 - 5.1 mmol/L 3.9   3.6   4.1    ?Chloride 98 - 111 mmol/L 95   95   94    ?CO2 22 - 32 mmol/L 22   21   22     ?Calcium 8.9 - 10.3 mg/dL 9.7   9.3   9.6    ? ? ?US Abdomen Complete ? ?Result Date: 04/23/2021 ?CLINICAL DATA:  Elevated lipase EXAM: ABDOMEN ULTRASOUND COMPLETE COMPARISON:  None. FINDINGS: Gallbladder: No gallstones or wall thickening visualized. No sonographic Murphy sign noted by sonographer. Common bile duct: Diameter: 3.7 mm Liver: There is slightly increased echogenicity. No focal abnormality is seen. Portal vein is patent on color Doppler imaging with normal direction of blood flow towards the liver. IVC: No abnormality visualized. Pancreas: Visualized portion unremarkable. Spleen: Spleen is not sonographically visualized. Right Kidney: Length: 10.7 cm. There is no hydronephrosis. There is increased cortical echogenicity. Left Kidney: Length: 9.8 cm. There is no hydronephrosis. There is increased cortical echogenicity. Abdominal aorta: No aneurysm visualized. Other findings: None. IMPRESSION: There is increased cortical echogenicity in both kidneys suggesting medical renal disease. There is increased echogenicity in the liver suggesting fatty infiltration. Spleen is not sonographically visualized. Abdominal sonogram is otherwise unremarkable. Electronically Signed   By: Elmer Picker M.D.   On: 04/23/2021 10:59  ? ?DG Chest Port 1 View ? ?Result Date: 04/23/2021 ?CLINICAL DATA:  Provided history: Hypoxia. History of asthma, diabetes, hypertension. EXAM: PORTABLE CHEST 1 VIEW COMPARISON:  Prior chest radiographs 04/23/2021 and earlier. FINDINGS: Heart size within normal limits. No appreciable airspace consolidation. No evidence of pleural effusion or pneumothorax. No acute bony  abnormality identified. IMPRESSION: No evidence of active cardiopulmonary disease. Electronically Signed   By: Kellie Simmering D.O.   On: 04/23/2021 11:04  ? ?DG Abd Acute W/Chest ? ?Result Date: 04/23/2021 ?CLINICAL DA

## 2021-04-26 NOTE — Progress Notes (Addendum)
? ? ?HD#3 ?Subjective:  ?Overnight Events: No acute events ? ?Resting comfortably in bed. Doing well overnight. She endorses significant improvement in her nausea and vomiting since addition of phenergan. Discussed discharged home in next day or so. Patient endorsed being on home meal time insulin, taking 3 U with meals.  ? ?Objective:  ?Vital signs in last 24 hours: ?Vitals:  ? 04/26/21 0336 04/26/21 0353 04/26/21 0810 04/26/21 1030  ?BP: (!) 152/105  (!) 150/102 (!) 137/94  ?Pulse: 100   89  ?Resp: 10  16 10   ?Temp: 99.2 ?F (37.3 ?C)   98.6 ?F (37 ?C)  ?TempSrc: Oral   Oral  ?SpO2: 100%   99%  ?Weight:  62.9 kg    ?Height:      ? ?Supplemental O2: Room Air ?SpO2: 99 % ?O2 Flow Rate (L/min): 2 L/min ? ? ?Physical Exam:  ?Constitutional: no acute distress ?HENT: normocephalic atraumatic ?Eyes: conjunctiva non-erythematous ?Neck: supple ?Cardiovascular: Tachycardic, no murmurs gallops or rubs ?Pulmonary/Chest: normal work of breathing on room air ?Abdominal: soft, non-tender, non-distended ?MSK: normal bulk and tone ?Psych: Normal mood and thought process ? ?Filed Weights  ? 04/23/21 1812 04/24/21 0411 04/26/21 0353  ?Weight: 62.6 kg 64 kg 62.9 kg  ? ? ? ?Intake/Output Summary (Last 24 hours) at 04/26/2021 1418 ?Last data filed at 04/25/2021 1800 ?Gross per 24 hour  ?Intake 200 ml  ?Output --  ?Net 200 ml  ? ? ?Net IO Since Admission: 1,980 mL [04/26/21 1418] ? ?Pertinent Labs: ? ?  Latest Ref Rng & Units 04/26/2021  ?  4:38 AM 04/25/2021  ?  9:04 AM 04/24/2021  ?  3:13 AM  ?CBC  ?WBC 4.0 - 10.5 K/uL 9.2   9.4   9.5    ?Hemoglobin 12.0 - 15.0 g/dL 13.3   13.5   13.2    ?Hematocrit 36.0 - 46.0 % 39.7   41.6   38.7    ?Platelets 150 - 400 K/uL 460   522   513    ? ? ? ?  Latest Ref Rng & Units 04/26/2021  ? 12:03 PM 04/26/2021  ?  7:21 AM 04/26/2021  ?  4:38 AM  ?CMP  ?Glucose 70 - 99 mg/dL 198   142   168    ?BUN 6 - 20 mg/dL 50   52   50    ?Creatinine 0.44 - 1.00 mg/dL 10.25   10.09   10.19    ?Sodium 135 - 145 mmol/L 130   130    130    ?Potassium 3.5 - 5.1 mmol/L 3.9   3.6   4.1    ?Chloride 98 - 111 mmol/L 95   95   94    ?CO2 22 - 32 mmol/L 22   21   22     ?Calcium 8.9 - 10.3 mg/dL 9.7   9.3   9.6    ? ? ?Imaging: ?No results found. ? ?Assessment/Plan:  ? ?Principal Problem: ?  Nausea and vomiting ?Active Problems: ?  Diabetic retinopathy ([place]) ?  Chronic hypertension ?  ESRD (end stage renal disease) on PD ?  Gastroparesis ?  DKA (diabetic ketoacidosis) ([place]) ? ? ?Patient Summary: ?Barbara Donovan is a 31 y.o. with a pertinent PMH of type 1 diabetes mellitus complicated by gastroparesis and retinopathy, diabetic nephropathy with ESRD on PD, hypertension, who presented with nausea and vomiting and admitted for DKA.  ? ?DKA 2/2 nausea and vomiting ?Type 1 diabetes mellitus ?Intractable nausea  and vomiting ?Gastroparesis ?Last anion gap of 13, on 20 U lantus and 3 U novolog TID w/ meals. She has had resolution of her nausea and vomiting, will continue phenergan and reglan. Plan to DC tomorrow if continuing to improve.  ?-Long-acting insulin, 20 units daily. 3U novolog with meals ?-BMP tomorrow morning ?-Phenergan daily as needed and continue reglan daily ?  ?ESRD on PD ?Chronic anemia due to ESRD ?PD not performed overnight, uncertain as to why. Nephrology following and appreciate their assistance in her care.  ? ?Hyponatremia ?Sodium corrects to 132 with hyperglycemia. Continue to monitor.  ? ?Hypertension ?BP with improvement with home medications of amlodipine, carvedilol, hydralazine. ?-Continue amlodipine 10 mg, carvedilol 25 mg twice daily, and hydralazine 50 3 times daily ? ?Diet: Renal ?IVF: none ?VTE: Heparin ?Code: Full ?PT/OT recs: None,  ? ?Dispo: Anticipated discharge to Home tomorrow ? ?Vasili Danylah Holden DO ?Internal Medicine Resident PGY-2 ?Pager [phone] ?Please contact the on call pager after 5 pm and on weekends at [phone].  ?

## 2021-04-26 NOTE — Progress Notes (Signed)
Paged by nursing staff that patient was in the hallways crying stating she wanted to go home.  Present at bedside discussed with the patient.  She states she has not son at home with special needs and needs to get back to him as soon as possible.  Believe she is safe for discharge and can follow-up with our clinic this week and hopefully with endocrinology as well.  Will send home with Phenergan if she has further episodes of nausea and vomiting.  She has been eating and drinking without any issues today. ?

## 2021-04-26 NOTE — Progress Notes (Signed)
Barbara Donovan to be D/C'd home per MD order. Discussed with the patient and all questions fully answered.  ?Skin clean and dry. ?IV catheter discontinued intact. Site without signs and symptoms of complications. Dressing and pressure applied. Dressing left in place to PD site. ?An After Visit Summary was printed and given to the patient.  ?Patient escorted via [place], and D/C home via private auto.  ?[name]  ?04/26/2021 5:37 PM ?  ?   ?

## 2021-04-26 NOTE — Progress Notes (Signed)
PT Cancellation Note ? ?Patient Details ?Name: Barbara Donovan ?MRN: [id number] ?DOB: 05-29-1990 ? ? ?Cancelled Treatment:    Reason Eval/Treat Not Completed: PT screened, no needs identified, will sign off. Pt reports she is able to mobilize independently.  ? ? ?Shary Decamp [facility] ?04/26/2021, 11:21 AM ?Suanne Marker PT ?Acute Rehabilitation Services ?Office [phone] ? ?

## 2021-04-26 NOTE — Discharge Instructions (Signed)
Ms. Borden, ? ?Thank you for allowing [name] to care for you during this hospitalization.  I am glad you are doing better overall.  You were admitted for nausea and vomiting that led to DKA.  He progressed well and we will continue on your home insulin of 20 units long-acting and 3 units with meals.  Please follow-up in our clinic this week and make sure you drink plenty of fluids.  Please continue PD at home in the evening.  If you have any questions or concerns our clinic is closed tomorrow however you can call the after-hours emergency number if you need anything. ? ?We will also be writing a prescription for Phenergan to take as needed for nausea.  Please continue to take your Reglan daily. ?

## 2021-04-28 ENCOUNTER — Encounter: Payer: Self-pay | Admitting: Physician Assistant

## 2021-04-28 ENCOUNTER — Ambulatory Visit (INDEPENDENT_AMBULATORY_CARE_PROVIDER_SITE_OTHER): Payer: 59 | Admitting: Physician Assistant

## 2021-04-28 VITALS — BP 128/82 | HR 96 | Ht 66.0 in | Wt 135.8 lb

## 2021-04-28 DIAGNOSIS — I10 Essential (primary) hypertension: Secondary | ICD-10-CM

## 2021-04-28 DIAGNOSIS — R600 Localized edema: Secondary | ICD-10-CM

## 2021-04-28 DIAGNOSIS — N186 End stage renal disease: Secondary | ICD-10-CM

## 2021-04-28 MED ORDER — HYDRALAZINE HCL 50 MG PO TABS: ORAL_TABLET | ORAL | 3 refills | Status: DC

## 2021-04-28 NOTE — Patient Instructions (Signed)
Medication Instructions:  ?Your physician recommends that you continue on your current medications as directed. You may take 1 extra Hydralazine a day for elevated blood pressures.  Please discuss with your Nephrologit. ?Please refer to the Current Medication list given to you today. ? ?*If you need a refill on your cardiac medications before your next appointment, please call your pharmacy* ? ? ?Lab Work: ?None ordered ? ?If you have labs (blood work) drawn today and your tests are completely normal, you will receive your results only by: ?MyChart Message (if you have MyChart) OR ?A paper copy in the mail ?If you have any lab test that is abnormal or we need to change your treatment, we will call you to review the results. ? ? ?Testing/Procedures: ?None ordered ? ? ?Follow-Up: ?At [facility], you and your health needs are our priority.  As part of our continuing mission to provide you with exceptional heart care, we have created designated Provider Care Teams.  These Care Teams include your primary Cardiologist (physician) and Advanced Practice Providers (APPs -  Physician Assistants and Nurse Practitioners) who all work together to provide you with the care you need, when you need it. ? ?We recommend signing up for the patient portal called "MyChart".  Sign up information is provided on this After Visit Summary.  MyChart is used to connect with patients for Virtual Visits (Telemedicine).  Patients are able to view lab/test results, encounter notes, upcoming appointments, etc.  Non-urgent messages can be sent to your provider as well.   ?To learn more about what you can do with MyChart, go to [email or web address].   ? ?Your next appointment:   ?12 month(s) ? ?The format for your next appointment:   ?In Person ? ?Provider:   ?Fransico Him, MD   ? ? ?Other Instructions ? ? ?Important Information About Sugar ? ? ? ? ? ? ?

## 2021-05-11 ENCOUNTER — Encounter: Payer: Self-pay | Admitting: Endocrinology

## 2021-05-11 DIAGNOSIS — E10319 Type 1 diabetes mellitus with unspecified diabetic retinopathy without macular edema: Secondary | ICD-10-CM

## 2021-05-11 MED ORDER — LANTUS SOLOSTAR 100 UNIT/ML ~~LOC~~ SOPN: 20.0000 [IU] | PEN_INJECTOR | SUBCUTANEOUS | 2 refills | Status: DC

## 2021-05-18 ENCOUNTER — Emergency Department (HOSPITAL_COMMUNITY): Payer: 59

## 2021-05-18 ENCOUNTER — Other Ambulatory Visit: Payer: Self-pay

## 2021-05-18 ENCOUNTER — Emergency Department (HOSPITAL_COMMUNITY)
Admission: EM | Admit: 2021-05-18 | Discharge: 2021-05-18 | Disposition: A | Discharge status: Home / Self Care | Payer: 59 | Attending: Emergency Medicine | Admitting: Emergency Medicine

## 2021-05-18 ENCOUNTER — Encounter (HOSPITAL_COMMUNITY): Payer: Self-pay

## 2021-05-18 DIAGNOSIS — N186 End stage renal disease: Secondary | ICD-10-CM | POA: Insufficient documentation

## 2021-05-18 DIAGNOSIS — K3184 Gastroparesis: Secondary | ICD-10-CM

## 2021-05-18 DIAGNOSIS — E876 Hypokalemia: Secondary | ICD-10-CM

## 2021-05-18 DIAGNOSIS — Z992 Dependence on renal dialysis: Secondary | ICD-10-CM | POA: Diagnosis not present

## 2021-05-18 DIAGNOSIS — Z794 Long term (current) use of insulin: Secondary | ICD-10-CM | POA: Diagnosis not present

## 2021-05-18 DIAGNOSIS — R1084 Generalized abdominal pain: Secondary | ICD-10-CM | POA: Diagnosis present

## 2021-05-18 DIAGNOSIS — R000 Tachycardia, unspecified: Secondary | ICD-10-CM | POA: Diagnosis not present

## 2021-05-18 DIAGNOSIS — E1065 Type 1 diabetes mellitus with hyperglycemia: Secondary | ICD-10-CM | POA: Diagnosis not present

## 2021-05-18 DIAGNOSIS — E1022 Type 1 diabetes mellitus with diabetic chronic kidney disease: Secondary | ICD-10-CM | POA: Insufficient documentation

## 2021-05-18 LAB — CBC WITH DIFFERENTIAL/PLATELET
Abs Immature Granulocytes: 0.03 10*3/uL (ref 0.00–0.07)
Basophils Absolute: 0 10*3/uL (ref 0.0–0.1)
Basophils Relative: 1 %
Eosinophils Absolute: 0 10*3/uL (ref 0.0–0.5)
Eosinophils Relative: 0 %
HCT: 39.2 % (ref 36.0–46.0)
Hemoglobin: 13.2 g/dL (ref 12.0–15.0)
Immature Granulocytes: 0 %
Lymphocytes Relative: 24 %
Lymphs Abs: 1.7 10*3/uL (ref 0.7–4.0)
MCH: 28.4 pg (ref 26.0–34.0)
MCHC: 33.7 g/dL (ref 30.0–36.0)
MCV: 84.5 fL (ref 80.0–100.0)
Monocytes Absolute: 0.4 10*3/uL (ref 0.1–1.0)
Monocytes Relative: 5 %
Neutro Abs: 5.1 10*3/uL (ref 1.7–7.7)
Neutrophils Relative %: 70 %
Platelets: 470 10*3/uL — ABNORMAL HIGH (ref 150–400)
RBC: 4.64 MIL/uL (ref 3.87–5.11)
RDW: 13 % (ref 11.5–15.5)
WBC: 7.2 10*3/uL (ref 4.0–10.5)
nRBC: 0 % (ref 0.0–0.2)

## 2021-05-18 LAB — I-STAT VENOUS BLOOD GAS, ED
Acid-Base Excess: 5 mmol/L — ABNORMAL HIGH (ref 0.0–2.0)
Bicarbonate: 25.3 mmol/L (ref 20.0–28.0)
Calcium, Ion: 1.08 mmol/L — ABNORMAL LOW (ref 1.15–1.40)
HCT: 41 % (ref 36.0–46.0)
Hemoglobin: 13.9 g/dL (ref 12.0–15.0)
O2 Saturation: 84 %
Potassium: 2.6 mmol/L — CL (ref 3.5–5.1)
Sodium: 133 mmol/L — ABNORMAL LOW (ref 135–145)
TCO2: 26 mmol/L (ref 22–32)
pCO2, Ven: 26.7 mmHg — ABNORMAL LOW (ref 44–60)
pH, Ven: 7.585 — ABNORMAL HIGH (ref 7.25–7.43)
pO2, Ven: 40 mmHg (ref 32–45)

## 2021-05-18 LAB — COMPREHENSIVE METABOLIC PANEL
ALT: 18 U/L (ref 0–44)
AST: 17 U/L (ref 15–41)
Albumin: 3.8 g/dL (ref 3.5–5.0)
Alkaline Phosphatase: 61 U/L (ref 38–126)
Anion gap: 20 — ABNORMAL HIGH (ref 5–15)
BUN: 45 mg/dL — ABNORMAL HIGH (ref 6–20)
CO2: 22 mmol/L (ref 22–32)
Calcium: 10.2 mg/dL (ref 8.9–10.3)
Chloride: 92 mmol/L — ABNORMAL LOW (ref 98–111)
Creatinine, Ser: 12.07 mg/dL — ABNORMAL HIGH (ref 0.44–1.00)
GFR, Estimated: 4 mL/min — ABNORMAL LOW (ref >60)
Glucose, Bld: 234 mg/dL — ABNORMAL HIGH (ref 70–99)
Potassium: 2.6 mmol/L — CL (ref 3.5–5.1)
Sodium: 134 mmol/L — ABNORMAL LOW (ref 135–145)
Total Bilirubin: 0.7 mg/dL (ref 0.3–1.2)
Total Protein: 7.7 g/dL (ref 6.5–8.1)

## 2021-05-18 LAB — LIPASE, BLOOD: Lipase: 42 U/L (ref 11–51)

## 2021-05-18 LAB — MAGNESIUM: Magnesium: 2.3 mg/dL (ref 1.7–2.4)

## 2021-05-18 LAB — CBG MONITORING, ED: Glucose-Capillary: 253 mg/dL — ABNORMAL HIGH (ref 70–99)

## 2021-05-18 LAB — I-STAT BETA HCG BLOOD, ED (MC, WL, AP ONLY): I-stat hCG, quantitative: <5 m[IU]/mL (ref <5)

## 2021-05-18 LAB — BETA-HYDROXYBUTYRIC ACID: Beta-Hydroxybutyric Acid: 1.94 mmol/L — ABNORMAL HIGH (ref 0.05–0.27)

## 2021-05-18 IMAGING — DX DG CHEST 1V PORT
1 series · 1 of 1 positions shown · non-contrast
Comparison: Radiograph [DATE]

CLINICAL DATA: chest pain, vomiting

EXAM:
PORTABLE CHEST 1 VIEW

[chest]
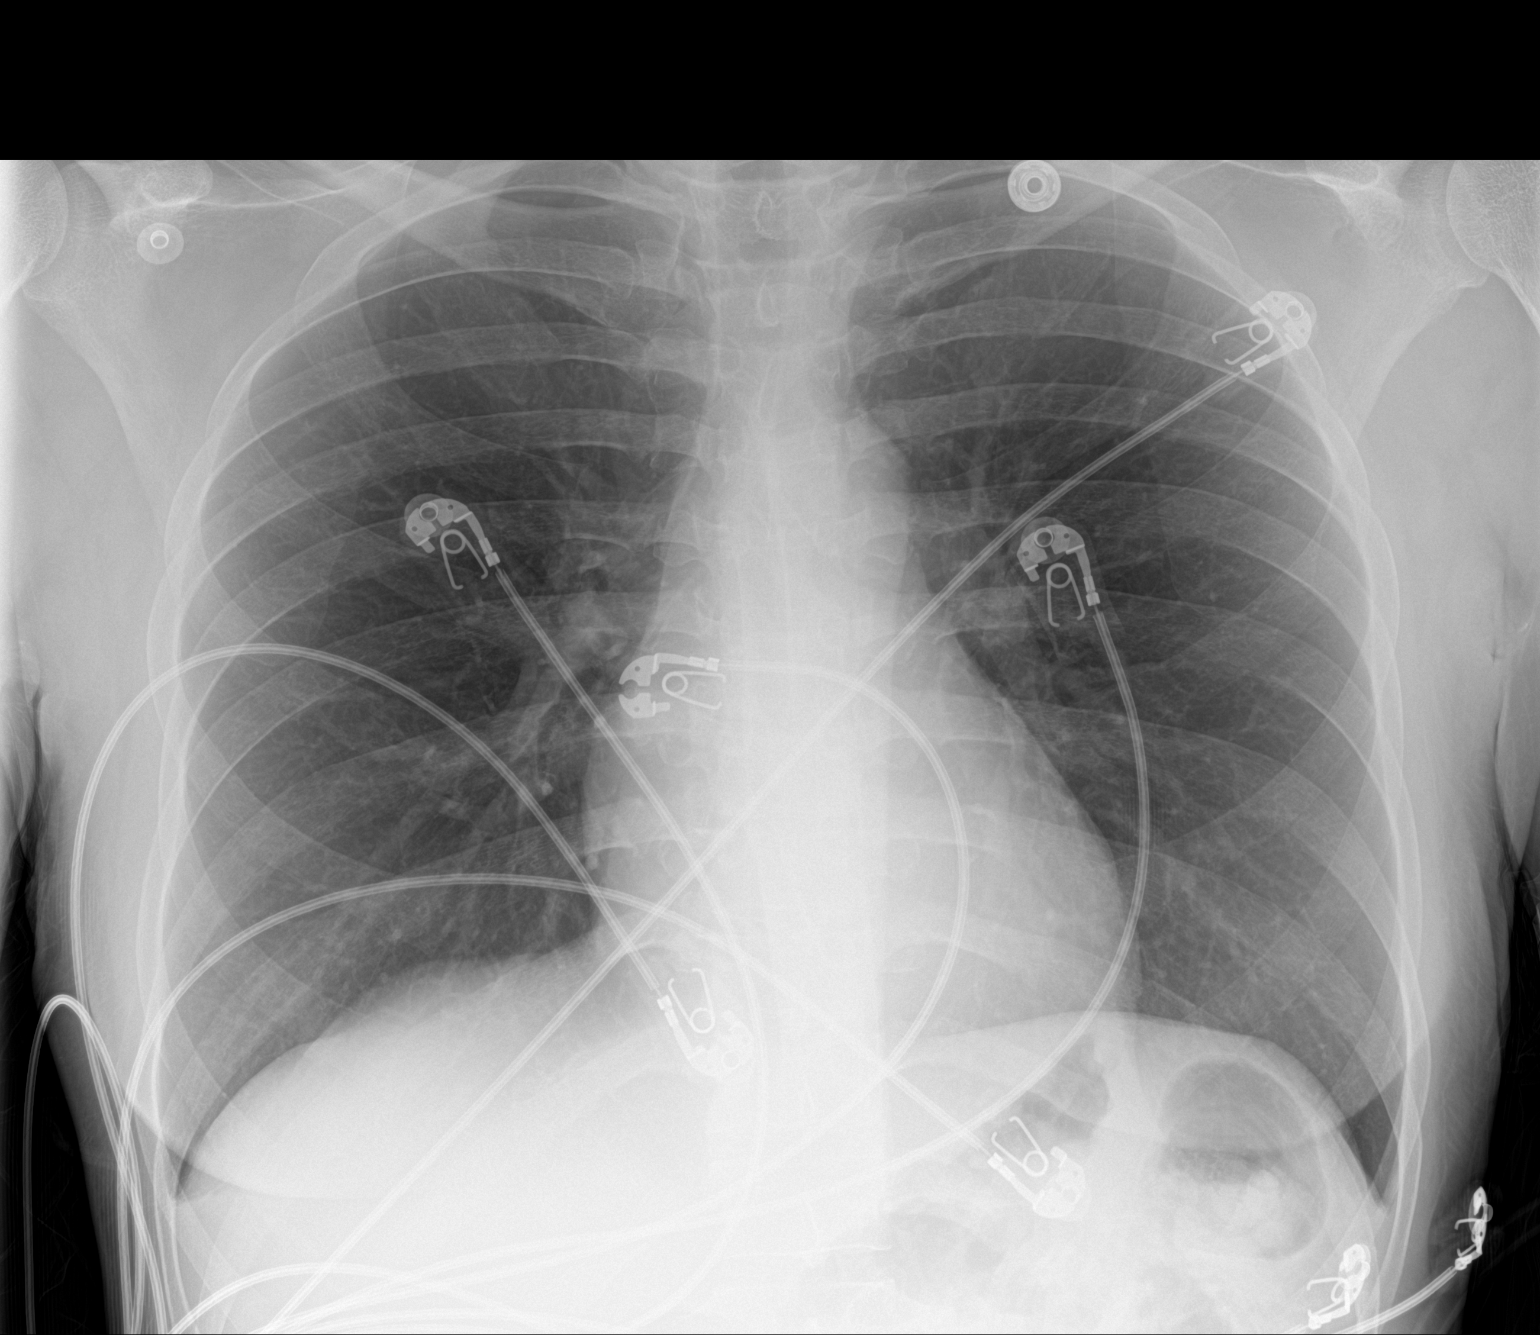

[1 of 1 positions shown; findings below may reference images not displayed]

FINDINGS: The cardiomediastinal silhouette is within normal limits. There is
no focal airspace consolidation. There is no pleural effusion. No
pneumothorax. There is no acute osseous abnormality.
IMPRESSION: No evidence of acute cardiopulmonary disease.

## 2021-05-18 IMAGING — DX DG CHEST 1V PORT
1 series · 1 of 1 positions shown · non-contrast
Comparison: [DATE]

CLINICAL DATA: Chest and abdominal pain. Nausea and vomiting.
Peritoneal dialysis at home

EXAM:
PORTABLE CHEST 1 VIEW

[chest ap]
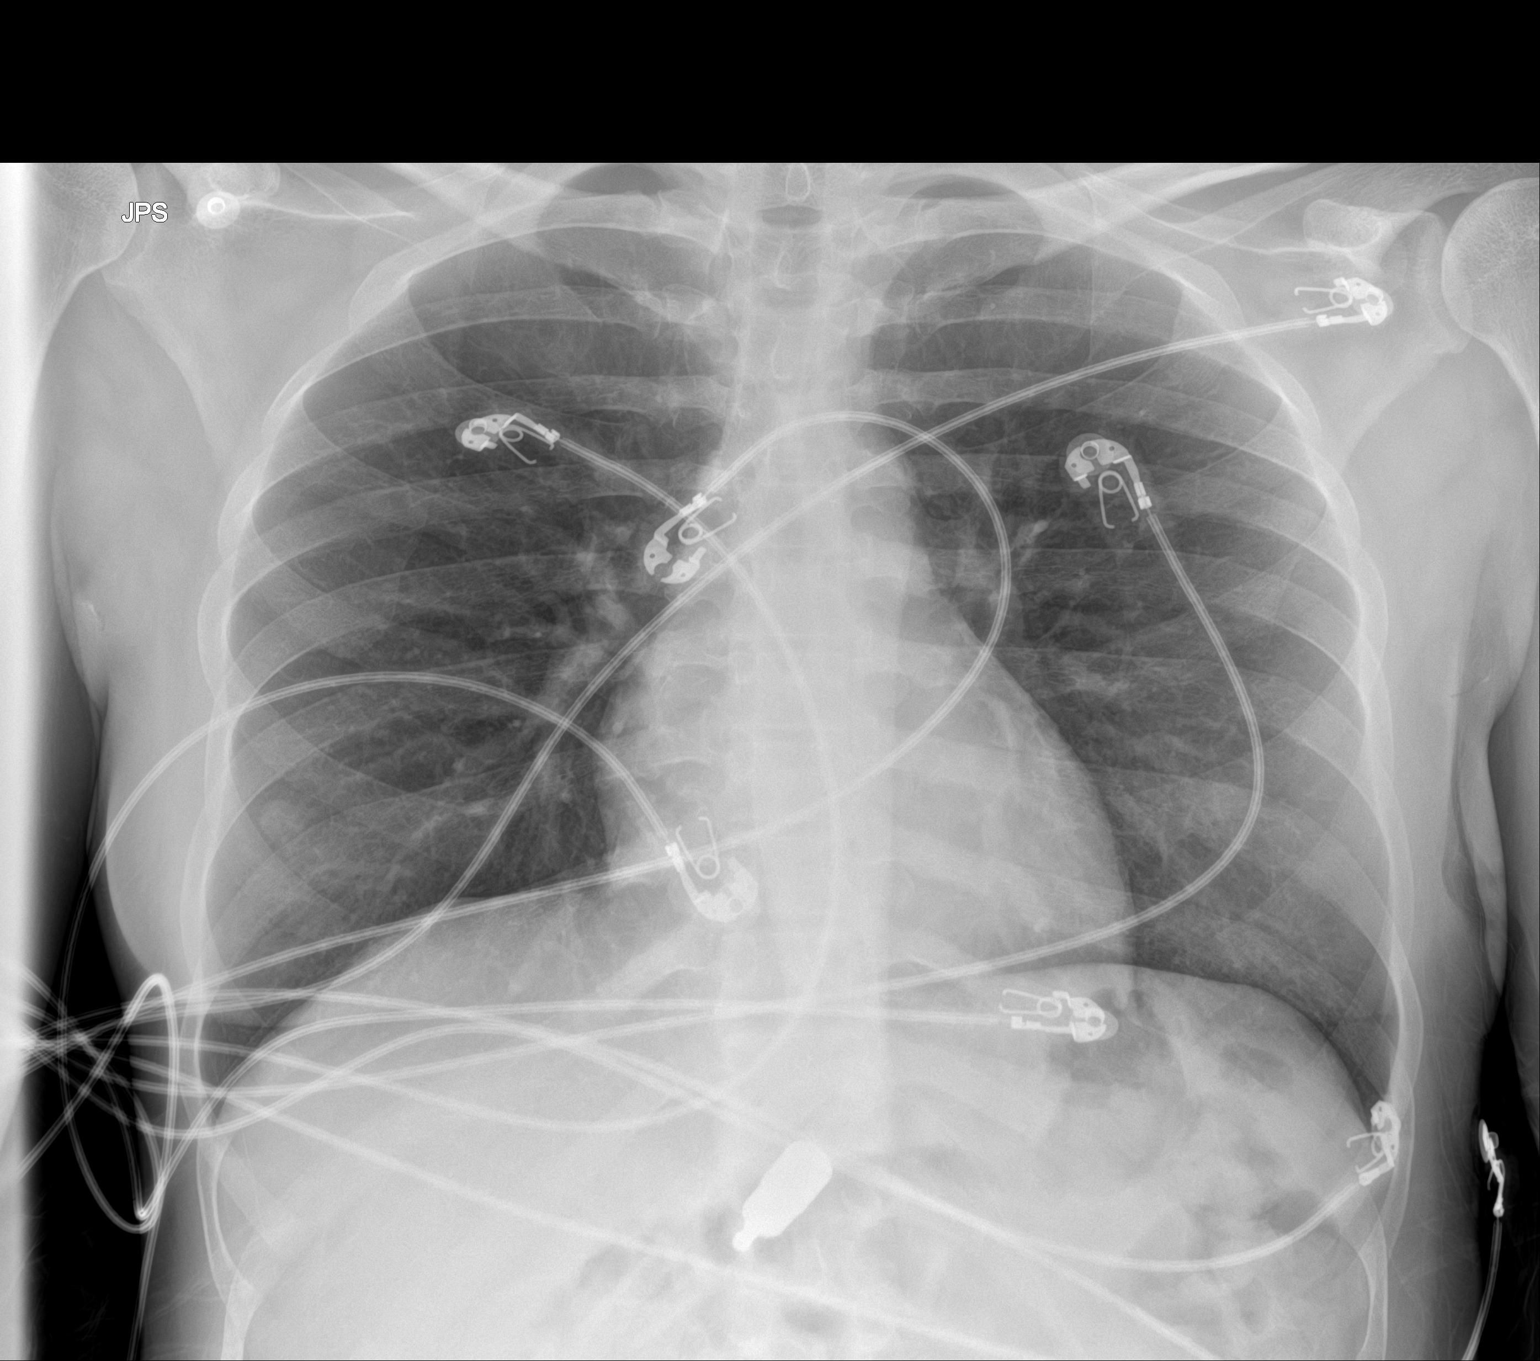

[1 of 1 positions shown; findings below may reference images not displayed]

FINDINGS: Numerous leads and wires project over the chest. Midline trachea.
Normal heart size and mediastinal contours. No pleural effusion or
pneumothorax. Presumed nipple shadow projecting over the right lung
base. Lungs otherwise clear. No free intraperitoneal air.
IMPRESSION: No acute cardiopulmonary disease.

Right sided probable nipple shadow. Repeat frontal radiograph with
nipple markers could confirm.

## 2021-05-18 MED ORDER — METOCLOPRAMIDE HCL 5 MG/ML IJ SOLN
10.0000 mg | Freq: Once | INTRAMUSCULAR | Status: AC
  Administered 2021-05-18: 10 mg via INTRAVENOUS
  Filled 2021-05-18: qty 2

## 2021-05-18 MED ORDER — POTASSIUM CHLORIDE CRYS ER 20 MEQ PO TBCR
40.0000 meq | EXTENDED_RELEASE_TABLET | Freq: Once | ORAL | Status: AC
Start: 2021-05-18 — End: 2021-05-18
  Administered 2021-05-18: 40 meq via ORAL
  Filled 2021-05-18: qty 2

## 2021-05-18 MED ORDER — HYDROMORPHONE HCL 1 MG/ML IJ SOLN
1.0000 mg | Freq: Once | INTRAMUSCULAR | Status: AC
  Administered 2021-05-18: 1 mg via INTRAVENOUS
  Filled 2021-05-18: qty 1

## 2021-05-18 MED ORDER — LACTATED RINGERS IV BOLUS
500.0000 mL | Freq: Once | INTRAVENOUS | Status: AC
  Administered 2021-05-18: 500 mL via INTRAVENOUS

## 2021-05-18 MED ORDER — POTASSIUM CHLORIDE CRYS ER 20 MEQ PO TBCR: EXTENDED_RELEASE_TABLET | ORAL | 0 refills | Status: DC

## 2021-05-18 NOTE — Discharge Instructions (Signed)
Start the potassium TOMORROW.  You were given a dose today.  Perform peritoneal dialysis as previously instructed. ? ?If you develop worsening, continued, or recurrent abdominal pain, uncontrolled vomiting, fever, chest or back pain, or any other new/concerning symptoms then return to the ER for evaluation.  ?

## 2021-05-18 NOTE — ED Triage Notes (Signed)
Pt BIB [facility] from home /dt abd pain,n/v  that started 2 days ago. Pt does peritoneal dialysis daily at home. Also c/o SOB. A&O X4. VSS. ?

## 2021-05-18 NOTE — ED Notes (Signed)
Patient verbalizes understanding of discharge instructions. Opportunity for questioning and answers were provided. Armband removed by staff, pt discharged from ED. Pt ambulatory to the ED waiting room.  ?

## 2021-05-18 NOTE — Progress Notes (Signed)
PIV attempted x2. Blood return noted; unable to thread catheter. RN notified of attempts. Labs unable to be obtained due to lack of access.  ? ?Ilamae Geng Lorita Officer, RN ? ?

## 2021-05-18 NOTE — ED Provider Notes (Signed)
?[place] ?Provider Note ? ? ?CSN: [id number] ?Arrival date & time: 05/18/21  0759 ? ?  ? ?History ? ?Chief Complaint  ?Patient presents with  ? Abdominal Pain  ? ? ?Barbara Donovan is a 31 y.o. female. ? ?HPI ?31 year old female with a history of type 1 diabetes, gastroparesis, ESRD on peritoneal dialysis, and history of DKA presents with vomiting and abdominal pain.  Symptoms started 2 days ago.  No hematemesis.  This morning she has developed shortness of breath and chest pain.  Some coughing with the vomiting.  No diarrhea.  The abdominal pain is diffuse.  This feels similar to when she has had issues with gastroparesis.  She has not noticed any change in the color of her dialysate.  No fevers. ? ?Home Medications ?Prior to Admission medications   ?Medication Sig Start Date End Date Taking? Authorizing Provider  ?amLODipine (NORVASC) 10 MG tablet Take 1 tablet (10 mg total) by mouth daily. ?Patient taking differently: Take 10 mg by mouth every evening. 03/09/20  Yes Gonfa, Taye T, MD  ?brimonidine (ALPHAGAN) 0.2 % ophthalmic solution Place 1 drop into the left eye 3 (three) times daily. 03/09/21  Yes [provider]  ?calcitRIOL (ROCALTROL) 0.5 MCG capsule Take 0.5 mcg by mouth daily. 12/02/20  Yes [provider]  ?carvedilol (COREG) 25 MG tablet Take 1 tablet (25 mg total) by mouth 2 (two) times daily. 03/07/19  Yes Imogene Burn, PA-C  ?FLUoxetine (PROZAC) 20 MG capsule Take 20 mg by mouth daily. 03/16/21  Yes [provider]  ?hydrALAZINE (APRESOLINE) 50 MG tablet Take 1 tablet by mouth twice a day, may take 1 extra tablet by mouth daily only as needed for elevated blood pressure ?Patient taking differently: Take 50 mg by mouth See admin instructions. Take 50mg  by mouth twice a day, may take an additional 50mg  by mouth daily only as needed for elevated blood pressure. 04/28/21  Yes Imogene Burn, PA-C  ?insulin glargine (LANTUS SOLOSTAR) 100  UNIT/ML Solostar Pen Inject 20 Units into the skin every morning. 05/11/21  Yes Renato Shin, MD  ?insulin lispro (HUMALOG KWIKPEN) 100 UNIT/ML KwikPen 3 times a day (just before each meal) 4-3-3 units, and pen needles 4/day ?Patient taking differently: Inject 3 Units into the skin 3 (three) times daily. 04/21/21  Yes Renato Shin, MD  ?metoCLOPramide (REGLAN) 5 MG tablet Take 1 tablet (5 mg total) by mouth 3 (three) times daily before meals. 02/23/21  Yes Sherwood Gambler, MD  ?multivitamin (RENA-VIT) TABS tablet Take 1 tablet by mouth at bedtime. 03/08/20  Yes Mercy Riding, MD  ?ondansetron (ZOFRAN-ODT) 4 MG disintegrating tablet Take 1 tablet (4 mg total) by mouth every 6 (six) hours as needed for nausea or vomiting. 04/07/21  Yes Demaio, Alexa, MD  ?potassium chloride SA (KLOR-CON M) 20 MEQ tablet 20 mEq twice daily x2 days and then 20 mEq daily for 3 days. 05/19/21  Yes Sherwood Gambler, MD  ?promethazine (PHENERGAN) 12.5 MG tablet Take 1 tablet (12.5 mg total) by mouth every 6 (six) hours as needed for nausea or vomiting. 04/26/21  Yes Katsadouros, Vasilios, MD  ?sevelamer carbonate (RENVELA) 800 MG tablet Take 1 tablet (800 mg total) by mouth 3 (three) times daily with meals. 02/05/21  Yes Bonnielee Haff, MD  ?Continuous Blood Gluc Receiver DEVI 1 Units by Does not apply route 4 (four) times daily -  before meals and at bedtime. May substitute for cheapest monitor with insurance 03/27/21  Demaio, Alexa, MD  ?Continuous Blood Gluc Sensor MISC 1 each by Does not apply route as directed. Use as directed every 14 days. May dispense FreeStyle [employer] or similar. 01/27/20   Antonieta Pert, MD  ?Continuous Glucose Monitor Sup MISC 1 Units by Does not apply route 4 (four) times daily -  before meals and at bedtime. May substitute for cheapest option with patient insurance 03/27/21 03/27/22  Scarlett Presto, MD  ?glucose blood (FREESTYLE TEST STRIPS) test strip Use as instructed 02/25/21   Terrilee Croak, MD  ?glucose blood  (ONETOUCH VERIO) test strip 1 each by Other route 3 (three) times daily. And lancets 3/day 11/28/20   Renato Shin, MD  ?   ? ?Allergies    ?Patient has no known allergies.   ? ?Review of Systems   ?Review of Systems  ?Constitutional:  Negative for fever.  ?Respiratory:  Positive for shortness of breath.   ?Cardiovascular:  Positive for chest pain.  ?Gastrointestinal:  Positive for abdominal pain, nausea and vomiting. Negative for diarrhea.  ? ?Physical Exam ?Updated Vital Signs ?BP 100/65   Pulse 82   Temp 98.2 ?F (36.8 ?C) (Oral)   Resp 15   SpO2 100%  ?Physical Exam ?Vitals and nursing note reviewed.  ?Constitutional:   ?   Appearance: She is well-developed.  ?HENT:  ?   Head: Normocephalic and atraumatic.  ?Cardiovascular:  ?   Rate and Rhythm: Regular rhythm. Tachycardia present.  ?   Heart sounds: Normal heart sounds.  ?Pulmonary:  ?   Effort: Pulmonary effort is normal.  ?   Breath sounds: Normal breath sounds.  ?Abdominal:  ?   Palpations: Abdomen is soft.  ?   Tenderness: There is generalized abdominal tenderness.  ?Skin: ?   General: Skin is warm and dry.  ?Neurological:  ?   Mental Status: She is alert.  ? ? ?ED Results / Procedures / Treatments   ?Labs ?(all labs ordered are listed, but only abnormal results are displayed) ?Labs Reviewed  ?COMPREHENSIVE METABOLIC PANEL - Abnormal; Notable for the following components:  ?    Result Value  ? Sodium 134 (*)   ? Potassium 2.6 (*)   ? Chloride 92 (*)   ? Glucose, Bld 234 (*)   ? BUN 45 (*)   ? Creatinine, Ser 12.07 (*)   ? GFR, Estimated 4 (*)   ? Anion gap 20 (*)   ? All other components within normal limits  ?CBC WITH DIFFERENTIAL/PLATELET - Abnormal; Notable for the following components:  ? Platelets 470 (*)   ? All other components within normal limits  ?BETA-HYDROXYBUTYRIC ACID - Abnormal; Notable for the following components:  ? Beta-Hydroxybutyric Acid 1.94 (*)   ? All other components within normal limits  ?CBG MONITORING, ED - Abnormal; Notable  for the following components:  ? Glucose-Capillary 253 (*)   ? All other components within normal limits  ?I-STAT VENOUS BLOOD GAS, ED - Abnormal; Notable for the following components:  ? pH, Ven 7.585 (*)   ? pCO2, Ven 26.7 (*)   ? Acid-Base Excess 5.0 (*)   ? Sodium 133 (*)   ? Potassium 2.6 (*)   ? Calcium, Ion 1.08 (*)   ? All other components within normal limits  ?LIPASE, BLOOD  ?MAGNESIUM  ?URINALYSIS, ROUTINE W REFLEX MICROSCOPIC  ?I-STAT BETA HCG BLOOD, ED ([facility], [facility], [facility] ONLY)  ? ? ?EKG ?EKG Interpretation ? ?Date/Time:  Monday May 18 2021 08:07:19 EDT ?Ventricular Rate:  106 ?PR Interval:  171 ?QRS Duration: 95 ?QT Interval:  356 ?QTC Calculation: 473 ?R Axis:   61 ?Text Interpretation: Sinus tachycardia Prominent P waves, nondiagnostic Probable left ventricular hypertrophy similar to April 2023 Confirmed by Sherwood Gambler [phone]) on 05/18/2021 8:14:41 AM ? ?Radiology ?DG Chest Portable 1 View ? ?Result Date: 05/18/2021 ?CLINICAL DATA:  chest pain, vomiting EXAM: PORTABLE CHEST 1 VIEW COMPARISON:  Radiograph 05/18/2021 FINDINGS: The cardiomediastinal silhouette is within normal limits. There is no focal airspace consolidation. There is no pleural effusion. No pneumothorax. There is no acute osseous abnormality. IMPRESSION: No evidence of acute cardiopulmonary disease. Electronically Signed   By: Maurine Simmering M.D.   On: 05/18/2021 14:14  ? ?DG Chest Portable 1 View ? ?Result Date: 05/18/2021 ?CLINICAL DATA:  Chest and abdominal pain. Nausea and vomiting. Peritoneal dialysis at home EXAM: PORTABLE CHEST 1 VIEW COMPARISON:  04/23/2021 FINDINGS: Numerous leads and wires project over the chest. Midline trachea. Normal heart size and mediastinal contours. No pleural effusion or pneumothorax. Presumed nipple shadow projecting over the right lung base. Lungs otherwise clear. No free intraperitoneal air. IMPRESSION: No acute cardiopulmonary disease. Right sided probable nipple shadow. Repeat frontal radiograph with nipple  markers could confirm. Electronically Signed   By: Abigail Miyamoto M.D.   On: 05/18/2021 08:51   ? ?Procedures ?Ultrasound ED Peripheral IV (Provider) ? ?Date/Time: 05/18/2021 10:42 AM ?Performed by: Ilona Sorrel

## 2021-05-18 NOTE — ED Notes (Signed)
Lab called to add on magnesium. ?

## 2021-05-19 ENCOUNTER — Other Ambulatory Visit (HOSPITAL_COMMUNITY): Payer: Self-pay

## 2021-05-19 ENCOUNTER — Telehealth: Payer: Self-pay

## 2021-05-19 NOTE — Telephone Encounter (Signed)
Pharmacy requesting prior authorization for insulin glargine (Lantus Solostar) 100 unit/Ml solostar pen. ?

## 2021-05-19 NOTE — Telephone Encounter (Signed)
Good afternoon, I reached out to [facility] [phone] and they confirmed patient filled Lantus Solostar 100 unit/ml pens for a $0 copay for a 75 day supply. No Prior Authorization needed at this time.  ?

## 2021-05-21 ENCOUNTER — Emergency Department (HOSPITAL_COMMUNITY)
Admission: EM | Admit: 2021-05-21 | Discharge: 2021-05-21 | Disposition: A | Discharge status: Home / Self Care | Payer: 59 | Attending: Emergency Medicine | Admitting: Emergency Medicine

## 2021-05-21 ENCOUNTER — Other Ambulatory Visit: Payer: Self-pay

## 2021-05-21 ENCOUNTER — Encounter (HOSPITAL_COMMUNITY): Payer: Self-pay

## 2021-05-21 DIAGNOSIS — E119 Type 2 diabetes mellitus without complications: Secondary | ICD-10-CM | POA: Diagnosis not present

## 2021-05-21 DIAGNOSIS — Z794 Long term (current) use of insulin: Secondary | ICD-10-CM | POA: Diagnosis not present

## 2021-05-21 DIAGNOSIS — K3184 Gastroparesis: Secondary | ICD-10-CM | POA: Diagnosis not present

## 2021-05-21 DIAGNOSIS — R112 Nausea with vomiting, unspecified: Secondary | ICD-10-CM | POA: Diagnosis present

## 2021-05-21 LAB — COMPREHENSIVE METABOLIC PANEL
ALT: 18 U/L (ref 0–44)
AST: 17 U/L (ref 15–41)
Albumin: 4 g/dL (ref 3.5–5.0)
Alkaline Phosphatase: 61 U/L (ref 38–126)
Anion gap: 21 — ABNORMAL HIGH (ref 5–15)
BUN: 60 mg/dL — ABNORMAL HIGH (ref 6–20)
CO2: 19 mmol/L — ABNORMAL LOW (ref 22–32)
Calcium: 11.1 mg/dL — ABNORMAL HIGH (ref 8.9–10.3)
Chloride: 96 mmol/L — ABNORMAL LOW (ref 98–111)
Creatinine, Ser: 10.33 mg/dL — ABNORMAL HIGH (ref 0.44–1.00)
GFR, Estimated: 5 mL/min — ABNORMAL LOW (ref >60)
Glucose, Bld: 152 mg/dL — ABNORMAL HIGH (ref 70–99)
Potassium: 3.1 mmol/L — ABNORMAL LOW (ref 3.5–5.1)
Sodium: 136 mmol/L (ref 135–145)
Total Bilirubin: 1 mg/dL (ref 0.3–1.2)
Total Protein: 8.2 g/dL — ABNORMAL HIGH (ref 6.5–8.1)

## 2021-05-21 LAB — CBC WITH DIFFERENTIAL/PLATELET
Abs Immature Granulocytes: 0.05 10*3/uL (ref 0.00–0.07)
Basophils Absolute: 0.1 10*3/uL (ref 0.0–0.1)
Basophils Relative: 1 %
Eosinophils Absolute: 0 10*3/uL (ref 0.0–0.5)
Eosinophils Relative: 0 %
HCT: 40.5 % (ref 36.0–46.0)
Hemoglobin: 13.8 g/dL (ref 12.0–15.0)
Immature Granulocytes: 1 %
Lymphocytes Relative: 17 %
Lymphs Abs: 1.6 10*3/uL (ref 0.7–4.0)
MCH: 28.3 pg (ref 26.0–34.0)
MCHC: 34.1 g/dL (ref 30.0–36.0)
MCV: 83.2 fL (ref 80.0–100.0)
Monocytes Absolute: 0.4 10*3/uL (ref 0.1–1.0)
Monocytes Relative: 4 %
Neutro Abs: 7.3 10*3/uL (ref 1.7–7.7)
Neutrophils Relative %: 77 %
Platelets: 482 10*3/uL — ABNORMAL HIGH (ref 150–400)
RBC: 4.87 MIL/uL (ref 3.87–5.11)
RDW: 12.9 % (ref 11.5–15.5)
WBC: 9.4 10*3/uL (ref 4.0–10.5)
nRBC: 0 % (ref 0.0–0.2)

## 2021-05-21 LAB — CBG MONITORING, ED: Glucose-Capillary: 143 mg/dL — ABNORMAL HIGH (ref 70–99)

## 2021-05-21 LAB — HCG, SERUM, QUALITATIVE: Preg, Serum: NEGATIVE

## 2021-05-21 MED ORDER — DIPHENHYDRAMINE HCL 50 MG/ML IJ SOLN
12.5000 mg | Freq: Once | INTRAMUSCULAR | Status: AC
  Administered 2021-05-21: 12.5 mg via INTRAVENOUS
  Filled 2021-05-21: qty 1

## 2021-05-21 MED ORDER — POTASSIUM CHLORIDE CRYS ER 20 MEQ PO TBCR
40.0000 meq | EXTENDED_RELEASE_TABLET | Freq: Once | ORAL | Status: AC
  Administered 2021-05-21: 40 meq via ORAL
  Filled 2021-05-21: qty 2

## 2021-05-21 MED ORDER — SODIUM CHLORIDE 0.9 % IV BOLUS
1000.0000 mL | Freq: Once | INTRAVENOUS | Status: AC
  Administered 2021-05-21: 1000 mL via INTRAVENOUS

## 2021-05-21 MED ORDER — METOCLOPRAMIDE HCL 5 MG/ML IJ SOLN
10.0000 mg | Freq: Once | INTRAMUSCULAR | Status: AC
  Administered 2021-05-21: 10 mg via INTRAVENOUS
  Filled 2021-05-21: qty 2

## 2021-05-21 NOTE — ED Provider Notes (Signed)
?[place] ?Provider Note ? ? ?CSN: [id number] ?Arrival date & time: 05/21/21  1857 ? ?  ? ?History ? ?Chief Complaint  ?Patient presents with  ? Nausea  ? Emesis  ?  Patient arrives from home due to N/V since this AM. Patient does peritoneal dialysis at home and since then has been sick. BP 189/84, HR 103, CBG 81. Per EMS patient was dry heaving en route. GCS 15 on arrival.   ? ? ?Barbara Donovan is a 31 y.o. female. ? ?Patient is a 31 year old female with past medical history of gastroparesis, diabetes on insulin, and DKA presenting for nausea and vomiting.  Patient mitts to nausea and vomiting over 10 episodes of nonbilious, nonbloody emesis over the past 24 hours.  Chart review demonstrates patient was seen 3 days ago on 05/18/2021 for abdominal pain with vomiting.  States she had improvement after Reglan and Benadryl.  No abdominal pain at this time.  Denies any diarrhea, fever, chills, sick contacts.  Patient attributes vomiting to gastroparesis.  Blood sugar 143 on arrival. ? ?The history is provided by the patient. No language interpreter was used.  ?Emesis ?Associated symptoms: no abdominal pain, no arthralgias, no chills, no cough, no fever and no sore throat   ? ?  ? ?Home Medications ?Prior to Admission medications   ?Medication Sig Start Date End Date Taking? Authorizing Provider  ?amLODipine (NORVASC) 10 MG tablet Take 1 tablet (10 mg total) by mouth daily. ?Patient taking differently: Take 10 mg by mouth every evening. 03/09/20   Mercy Riding, MD  ?brimonidine (ALPHAGAN) 0.2 % ophthalmic solution Place 1 drop into the left eye 3 (three) times daily. 03/09/21   [provider]  ?calcitRIOL (ROCALTROL) 0.5 MCG capsule Take 0.5 mcg by mouth daily. 12/02/20   [provider]  ?carvedilol (COREG) 25 MG tablet Take 1 tablet (25 mg total) by mouth 2 (two) times daily. 03/07/19   Imogene Burn, PA-C  ?Continuous Blood Gluc Receiver DEVI 1 Units by Does  not apply route 4 (four) times daily -  before meals and at bedtime. May substitute for cheapest monitor with insurance 03/27/21   Scarlett Presto, MD  ?Continuous Blood Gluc Sensor MISC 1 each by Does not apply route as directed. Use as directed every 14 days. May dispense FreeStyle [employer] or similar. 01/27/20   Antonieta Pert, MD  ?Continuous Glucose Monitor Sup MISC 1 Units by Does not apply route 4 (four) times daily -  before meals and at bedtime. May substitute for cheapest option with patient insurance 03/27/21 03/27/22  Scarlett Presto, MD  ?FLUoxetine (PROZAC) 20 MG capsule Take 20 mg by mouth daily. 03/16/21   [provider]  ?glucose blood (FREESTYLE TEST STRIPS) test strip Use as instructed 02/25/21   Terrilee Croak, MD  ?glucose blood (ONETOUCH VERIO) test strip 1 each by Other route 3 (three) times daily. And lancets 3/day 11/28/20   Renato Shin, MD  ?hydrALAZINE (APRESOLINE) 50 MG tablet Take 1 tablet by mouth twice a day, may take 1 extra tablet by mouth daily only as needed for elevated blood pressure ?Patient taking differently: Take 50 mg by mouth See admin instructions. Take 50mg  by mouth twice a day, may take an additional 50mg  by mouth daily only as needed for elevated blood pressure. 04/28/21   Imogene Burn, PA-C  ?insulin glargine (LANTUS SOLOSTAR) 100 UNIT/ML Solostar Pen Inject 20 Units into the skin every morning. 05/11/21  Renato Shin, MD  ?insulin lispro (HUMALOG KWIKPEN) 100 UNIT/ML KwikPen 3 times a day (just before each meal) 4-3-3 units, and pen needles 4/day ?Patient taking differently: Inject 3 Units into the skin 3 (three) times daily. 04/21/21   Renato Shin, MD  ?metoCLOPramide (REGLAN) 5 MG tablet Take 1 tablet (5 mg total) by mouth 3 (three) times daily before meals. 02/23/21   Sherwood Gambler, MD  ?multivitamin (RENA-VIT) TABS tablet Take 1 tablet by mouth at bedtime. 03/08/20   Mercy Riding, MD  ?ondansetron (ZOFRAN-ODT) 4 MG disintegrating tablet Take 1 tablet (4  mg total) by mouth every 6 (six) hours as needed for nausea or vomiting. 04/07/21   Demaio, Alexa, MD  ?potassium chloride SA (KLOR-CON M) 20 MEQ tablet 20 mEq twice daily x2 days and then 20 mEq daily for 3 days. 05/19/21   Sherwood Gambler, MD  ?promethazine (PHENERGAN) 12.5 MG tablet Take 1 tablet (12.5 mg total) by mouth every 6 (six) hours as needed for nausea or vomiting. 04/26/21   Riesa Pope, MD  ?sevelamer carbonate (RENVELA) 800 MG tablet Take 1 tablet (800 mg total) by mouth 3 (three) times daily with meals. 02/05/21   Bonnielee Haff, MD  ?   ? ?Allergies    ?Patient has no known allergies.   ? ?Review of Systems   ?Review of Systems  ?Constitutional:  Negative for chills and fever.  ?HENT:  Negative for ear pain and sore throat.   ?Eyes:  Negative for pain and visual disturbance.  ?Respiratory:  Negative for cough and shortness of breath.   ?Cardiovascular:  Negative for chest pain and palpitations.  ?Gastrointestinal:  Positive for nausea and vomiting. Negative for abdominal pain.  ?Genitourinary:  Negative for dysuria and hematuria.  ?Musculoskeletal:  Negative for arthralgias and back pain.  ?Skin:  Negative for color change and rash.  ?Neurological:  Negative for seizures and syncope.  ?All other systems reviewed and are negative. ? ?Physical Exam ?Updated Vital Signs ?BP (!) 178/96   Pulse 96   Temp 97.8 ?F (36.6 ?C) (Oral)   Resp 16   Ht 5\' 6"  (1.676 m)   Wt 61 kg   SpO2 97%   BMI 21.71 kg/m?  ?Physical Exam ?Vitals and nursing note reviewed.  ?Constitutional:   ?   General: She is not in acute distress. ?   Appearance: She is well-developed.  ?HENT:  ?   Head: Normocephalic and atraumatic.  ?Eyes:  ?   Conjunctiva/sclera: Conjunctivae normal.  ?Cardiovascular:  ?   Rate and Rhythm: Normal rate and regular rhythm.  ?   Heart sounds: No murmur heard. ?Pulmonary:  ?   Effort: Pulmonary effort is normal. No respiratory distress.  ?   Breath sounds: Normal breath sounds.  ?Abdominal:  ?    Palpations: Abdomen is soft.  ?   Tenderness: There is no abdominal tenderness.  ?Musculoskeletal:     ?   General: No swelling.  ?   Cervical back: Neck supple.  ?Skin: ?   General: Skin is warm and dry.  ?   Capillary Refill: Capillary refill takes less than 2 seconds.  ?Neurological:  ?   Mental Status: She is alert.  ?Psychiatric:     ?   Mood and Affect: Mood normal.  ? ? ?ED Results / Procedures / Treatments   ?Labs ?(all labs ordered are listed, but only abnormal results are displayed) ?Labs Reviewed  ?CBC WITH DIFFERENTIAL/PLATELET - Abnormal; Notable for the following components:  ?  Result Value  ? Platelets 482 (*)   ? All other components within normal limits  ?COMPREHENSIVE METABOLIC PANEL - Abnormal; Notable for the following components:  ? Potassium 3.1 (*)   ? Chloride 96 (*)   ? CO2 19 (*)   ? Glucose, Bld 152 (*)   ? BUN 60 (*)   ? Creatinine, Ser 10.33 (*)   ? Calcium 11.1 (*)   ? Total Protein 8.2 (*)   ? GFR, Estimated 5 (*)   ? Anion gap 21 (*)   ? All other components within normal limits  ?CBG MONITORING, ED - Abnormal; Notable for the following components:  ? Glucose-Capillary 143 (*)   ? All other components within normal limits  ?HCG, SERUM, QUALITATIVE  ? ? ?EKG ?None ? ?Radiology ?No results found. ? ?Procedures ?Procedures  ? ? ?Medications Ordered in ED ?Medications  ?potassium chloride SA (KLOR-CON M) CR tablet 40 mEq (has no administration in time range)  ?sodium chloride 0.9 % bolus 1,000 mL (1,000 mLs Intravenous New Bag/Given 05/21/21 2158)  ?metoCLOPramide (REGLAN) injection 10 mg (10 mg Intravenous Given 05/21/21 2159)  ?diphenhydrAMINE (BENADRYL) injection 12.5 mg (12.5 mg Intravenous Given 05/21/21 2156)  ? ? ?ED Course/ Medical Decision Making/ A&P ?  ?                        ?Medical Decision Making ?Amount and/or Complexity of Data Reviewed ?Labs: ordered. ? ?Risk ?Prescription drug management. ? ? ?15:35 PM ?31 year old female with past medical history of gastroparesis,  diabetes on insulin, and DKA presenting for nausea and vomiting.  Patient is alert and oriented x3, no acute distress, afebrile, stable vital signs.  Abdomen is soft and nontender.  Doubt serious life-threatening int

## 2021-05-21 NOTE — ED Notes (Signed)
This RN attempted PIV x1 and was unsuccessful. Blood work was able to be obtained but IV catheter would not thread. Patient will need USGIV placed at this time due to being a difficult stick.  ?

## 2021-05-22 ENCOUNTER — Other Ambulatory Visit (HOSPITAL_COMMUNITY): Payer: Self-pay

## 2021-05-27 ENCOUNTER — Encounter (HOSPITAL_COMMUNITY): Payer: Self-pay

## 2021-05-27 ENCOUNTER — Emergency Department (HOSPITAL_COMMUNITY)
Admission: EM | Admit: 2021-05-27 | Discharge: 2021-05-27 | Disposition: A | Discharge status: Home / Self Care | Payer: 59 | Attending: Emergency Medicine | Admitting: Emergency Medicine

## 2021-05-27 ENCOUNTER — Other Ambulatory Visit: Payer: Self-pay

## 2021-05-27 DIAGNOSIS — K3184 Gastroparesis: Secondary | ICD-10-CM

## 2021-05-27 DIAGNOSIS — R1084 Generalized abdominal pain: Secondary | ICD-10-CM | POA: Diagnosis present

## 2021-05-27 LAB — CBC WITH DIFFERENTIAL/PLATELET
Abs Immature Granulocytes: 0.07 10*3/uL (ref 0.00–0.07)
Basophils Absolute: 0.1 10*3/uL (ref 0.0–0.1)
Basophils Relative: 1 %
Eosinophils Absolute: 0.1 10*3/uL (ref 0.0–0.5)
Eosinophils Relative: 1 %
HCT: 35.8 % — ABNORMAL LOW (ref 36.0–46.0)
Hemoglobin: 11.9 g/dL — ABNORMAL LOW (ref 12.0–15.0)
Immature Granulocytes: 1 %
Lymphocytes Relative: 16 %
Lymphs Abs: 1.7 10*3/uL (ref 0.7–4.0)
MCH: 28.7 pg (ref 26.0–34.0)
MCHC: 33.2 g/dL (ref 30.0–36.0)
MCV: 86.3 fL (ref 80.0–100.0)
Monocytes Absolute: 0.5 10*3/uL (ref 0.1–1.0)
Monocytes Relative: 5 %
Neutro Abs: 8.3 10*3/uL — ABNORMAL HIGH (ref 1.7–7.7)
Neutrophils Relative %: 76 %
Platelets: 470 10*3/uL — ABNORMAL HIGH (ref 150–400)
RBC: 4.15 MIL/uL (ref 3.87–5.11)
RDW: 13 % (ref 11.5–15.5)
WBC: 10.7 10*3/uL — ABNORMAL HIGH (ref 4.0–10.5)
nRBC: 0 % (ref 0.0–0.2)

## 2021-05-27 LAB — COMPREHENSIVE METABOLIC PANEL
ALT: 16 U/L (ref 0–44)
AST: 16 U/L (ref 15–41)
Albumin: 3.4 g/dL — ABNORMAL LOW (ref 3.5–5.0)
Alkaline Phosphatase: 46 U/L (ref 38–126)
Anion gap: 16 — ABNORMAL HIGH (ref 5–15)
BUN: 50 mg/dL — ABNORMAL HIGH (ref 6–20)
CO2: 19 mmol/L — ABNORMAL LOW (ref 22–32)
Calcium: 10.5 mg/dL — ABNORMAL HIGH (ref 8.9–10.3)
Chloride: 102 mmol/L (ref 98–111)
Creatinine, Ser: 8.81 mg/dL — ABNORMAL HIGH (ref 0.44–1.00)
GFR, Estimated: 6 mL/min — ABNORMAL LOW (ref >60)
Glucose, Bld: 163 mg/dL — ABNORMAL HIGH (ref 70–99)
Potassium: 3.8 mmol/L (ref 3.5–5.1)
Sodium: 137 mmol/L (ref 135–145)
Total Bilirubin: 0.6 mg/dL (ref 0.3–1.2)
Total Protein: 7.1 g/dL (ref 6.5–8.1)

## 2021-05-27 LAB — I-STAT BETA HCG BLOOD, ED (MC, WL, AP ONLY): I-stat hCG, quantitative: <5 m[IU]/mL (ref <5)

## 2021-05-27 LAB — LIPASE, BLOOD: Lipase: 59 U/L — ABNORMAL HIGH (ref 11–51)

## 2021-05-27 MED ORDER — DIPHENHYDRAMINE HCL 50 MG/ML IJ SOLN
12.5000 mg | Freq: Once | INTRAMUSCULAR | Status: AC
  Administered 2021-05-27: 12.5 mg via INTRAVENOUS
  Filled 2021-05-27: qty 1

## 2021-05-27 MED ORDER — KETOROLAC TROMETHAMINE 15 MG/ML IJ SOLN
15.0000 mg | Freq: Once | INTRAMUSCULAR | Status: AC
  Administered 2021-05-27: 15 mg via INTRAVENOUS
  Filled 2021-05-27: qty 1

## 2021-05-27 MED ORDER — ONDANSETRON 4 MG PO TBDP
4.0000 mg | ORAL_TABLET | Freq: Once | ORAL | Status: AC
  Administered 2021-05-27: 4 mg via ORAL
  Filled 2021-05-27: qty 1

## 2021-05-27 MED ORDER — METOCLOPRAMIDE HCL 5 MG/ML IJ SOLN
10.0000 mg | Freq: Once | INTRAMUSCULAR | Status: AC
  Administered 2021-05-27: 10 mg via INTRAVENOUS
  Filled 2021-05-27: qty 2

## 2021-05-27 MED ORDER — FAMOTIDINE IN NACL 20-0.9 MG/50ML-% IV SOLN
20.0000 mg | Freq: Once | INTRAVENOUS | Status: AC
  Administered 2021-05-27: 20 mg via INTRAVENOUS
  Filled 2021-05-27: qty 50

## 2021-05-27 MED ORDER — SODIUM CHLORIDE 0.9 % IV BOLUS
1000.0000 mL | Freq: Once | INTRAVENOUS | Status: AC
  Administered 2021-05-27: 1000 mL via INTRAVENOUS

## 2021-05-27 NOTE — ED Triage Notes (Addendum)
BIB EMS from home. Peritoneal dialysis pt.  Comes in for n/v.  States she has gastroparesis.  Generalized abdominal pain.  ?VSS  22G LH.  Took po zofran and phenergan at home with no relief.  ?

## 2021-05-27 NOTE — ED Provider Triage Note (Signed)
Emergency Medicine Provider Triage Evaluation Note ? ?Barbara Donovan , a 31 y.o. female  was evaluated in triage.  Pt complains of abdominal pain, vomiting since this morning.  Patient states this feels like her typical gastroparesis episode.  She took her home medications but was unable to keep them down.  Denies fever. ? ?Review of Systems  ?Positive: As above ?Negative: As above ? ?Physical Exam  ?BP (!) 169/110   Pulse 98   Temp 98.8 ?F (37.1 ?C) (Oral)   Resp 16   SpO2 100%  ?Gen:   Awake, no distress   ?Resp:  Normal effort  ?MSK:   Moves extremities without difficulty  ?Other:  Abdomen nondistended without flank tenderness ? ?Medical Decision Making  ?Medically screening exam initiated at 3:36 PM.  Appropriate orders placed.  MEAGON DUSKIN was informed that the remainder of the evaluation will be completed by another provider, this initial triage assessment does not replace that evaluation, and the importance of remaining in the ED until their evaluation is complete. ? ? ?  ?Evlyn Courier, PA-C ?05/27/21 1536 ? ?

## 2021-05-27 NOTE — ED Provider Notes (Signed)
?[facility] ?Provider Note ? ? ?CSN: [id number] ?Arrival date & time: 05/27/21  1446 ? ?  ? ?History ? ?Chief Complaint  ?Patient presents with  ? Abdominal Pain  ? ? ?Barbara Donovan is a 31 y.o. female. ? ?Patient is a 31 year old female with past medical history of gastroparesis presenting for abdominal pain.  Patient admits to generalized abdominal pain, nausea, vomiting this morning.  States it feels like her normal gastroparesis episodes.  Took home medications with little improvement of symptoms.  Denies any fevers, chills, diarrhea.  Denies sick contacts.  Denies current pregnancy. Denies urinary complaints. ? ?The history is provided by the patient. No language interpreter was used.  ?Abdominal Pain ?Associated symptoms: nausea and vomiting   ?Associated symptoms: no chest pain, no chills, no cough, no dysuria, no fever, no hematuria, no shortness of breath and no sore throat   ? ?  ? ?Home Medications ?Prior to Admission medications   ?Medication Sig Start Date End Date Taking? Authorizing Provider  ?amLODipine (NORVASC) 10 MG tablet Take 1 tablet (10 mg total) by mouth daily. ?Patient taking differently: Take 10 mg by mouth every evening. 03/09/20   Mercy Riding, MD  ?brimonidine (ALPHAGAN) 0.2 % ophthalmic solution Place 1 drop into the left eye 3 (three) times daily. 03/09/21   [provider]  ?calcitRIOL (ROCALTROL) 0.5 MCG capsule Take 0.5 mcg by mouth daily. 12/02/20   [provider]  ?carvedilol (COREG) 25 MG tablet Take 1 tablet (25 mg total) by mouth 2 (two) times daily. 03/07/19   Imogene Burn, PA-C  ?Continuous Blood Gluc Receiver DEVI 1 Units by Does not apply route 4 (four) times daily -  before meals and at bedtime. May substitute for cheapest monitor with insurance 03/27/21   Scarlett Presto, MD  ?Continuous Blood Gluc Sensor MISC 1 each by Does not apply route as directed. Use as directed every 14 days. May dispense FreeStyle [employer] or similar. 01/27/20   Antonieta Pert, MD  ?Continuous Glucose Monitor Sup MISC 1 Units by Does not apply route 4 (four) times daily -  before meals and at bedtime. May substitute for cheapest option with patient insurance 03/27/21 03/27/22  Scarlett Presto, MD  ?FLUoxetine (PROZAC) 20 MG capsule Take 20 mg by mouth daily. 03/16/21   [provider]  ?glucose blood (FREESTYLE TEST STRIPS) test strip Use as instructed 02/25/21   Terrilee Croak, MD  ?glucose blood (ONETOUCH VERIO) test strip 1 each by Other route 3 (three) times daily. And lancets 3/day 11/28/20   Renato Shin, MD  ?hydrALAZINE (APRESOLINE) 50 MG tablet Take 1 tablet by mouth twice a day, may take 1 extra tablet by mouth daily only as needed for elevated blood pressure ?Patient taking differently: Take 50 mg by mouth See admin instructions. Take 50mg  by mouth twice a day, may take an additional 50mg  by mouth daily only as needed for elevated blood pressure. 04/28/21   Imogene Burn, PA-C  ?insulin glargine (LANTUS SOLOSTAR) 100 UNIT/ML Solostar Pen Inject 20 Units into the skin every morning. 05/11/21   Renato Shin, MD  ?insulin lispro (HUMALOG KWIKPEN) 100 UNIT/ML KwikPen 3 times a day (just before each meal) 4-3-3 units, and pen needles 4/day ?Patient taking differently: Inject 3 Units into the skin 3 (three) times daily. 04/21/21   Renato Shin, MD  ?metoCLOPramide (REGLAN) 5 MG tablet Take 1 tablet (5 mg total) by mouth 3 (three) times daily before meals.  02/23/21   Sherwood Gambler, MD  ?multivitamin (RENA-VIT) TABS tablet Take 1 tablet by mouth at bedtime. 03/08/20   Mercy Riding, MD  ?ondansetron (ZOFRAN-ODT) 4 MG disintegrating tablet Take 1 tablet (4 mg total) by mouth every 6 (six) hours as needed for nausea or vomiting. 04/07/21   Demaio, Alexa, MD  ?potassium chloride SA (KLOR-CON M) 20 MEQ tablet 20 mEq twice daily x2 days and then 20 mEq daily for 3 days. 05/19/21   Sherwood Gambler, MD  ?promethazine (PHENERGAN) 12.5 MG tablet Take  1 tablet (12.5 mg total) by mouth every 6 (six) hours as needed for nausea or vomiting. 04/26/21   Riesa Pope, MD  ?sevelamer carbonate (RENVELA) 800 MG tablet Take 1 tablet (800 mg total) by mouth 3 (three) times daily with meals. 02/05/21   Bonnielee Haff, MD  ?   ? ?Allergies    ?Patient has no known allergies.   ? ?Review of Systems   ?Review of Systems  ?Constitutional:  Negative for chills and fever.  ?HENT:  Negative for ear pain and sore throat.   ?Eyes:  Negative for pain and visual disturbance.  ?Respiratory:  Negative for cough and shortness of breath.   ?Cardiovascular:  Negative for chest pain and palpitations.  ?Gastrointestinal:  Positive for abdominal pain, nausea and vomiting.  ?Genitourinary:  Negative for dysuria and hematuria.  ?Musculoskeletal:  Negative for arthralgias and back pain.  ?Skin:  Negative for color change and rash.  ?Neurological:  Negative for seizures and syncope.  ?All other systems reviewed and are negative. ? ?Physical Exam ?Updated Vital Signs ?BP (!) 176/102   Pulse (!) 106   Temp 98.8 ?F (37.1 ?C) (Oral)   Resp 17   SpO2 100%  ?Physical Exam ?Vitals and nursing note reviewed.  ?Constitutional:   ?   General: She is not in acute distress. ?   Appearance: She is well-developed.  ?HENT:  ?   Head: Normocephalic and atraumatic.  ?Eyes:  ?   Conjunctiva/sclera: Conjunctivae normal.  ?Cardiovascular:  ?   Rate and Rhythm: Normal rate and regular rhythm.  ?   Heart sounds: No murmur heard. ?Pulmonary:  ?   Effort: Pulmonary effort is normal. No respiratory distress.  ?   Breath sounds: Normal breath sounds.  ?Abdominal:  ?   Palpations: Abdomen is soft.  ?   Tenderness: There is generalized abdominal tenderness. There is no guarding or rebound.  ?Musculoskeletal:     ?   General: No swelling.  ?   Cervical back: Neck supple.  ?Skin: ?   General: Skin is warm and dry.  ?   Capillary Refill: Capillary refill takes less than 2 seconds.  ?Neurological:  ?   Mental Status:  She is alert.  ?Psychiatric:     ?   Mood and Affect: Mood normal.  ? ? ?ED Results / Procedures / Treatments   ?Labs ?(all labs ordered are listed, but only abnormal results are displayed) ?Labs Reviewed  ?CBC WITH DIFFERENTIAL/PLATELET - Abnormal; Notable for the following components:  ?    Result Value  ? WBC 10.7 (*)   ? Hemoglobin 11.9 (*)   ? HCT 35.8 (*)   ? Platelets 470 (*)   ? Neutro Abs 8.3 (*)   ? All other components within normal limits  ?COMPREHENSIVE METABOLIC PANEL - Abnormal; Notable for the following components:  ? CO2 19 (*)   ? Glucose, Bld 163 (*)   ? BUN 50 (*)   ?  Creatinine, Ser 8.81 (*)   ? Calcium 10.5 (*)   ? Albumin 3.4 (*)   ? GFR, Estimated 6 (*)   ? Anion gap 16 (*)   ? All other components within normal limits  ?LIPASE, BLOOD - Abnormal; Notable for the following components:  ? Lipase 59 (*)   ? All other components within normal limits  ?URINALYSIS, ROUTINE W REFLEX MICROSCOPIC  ?I-STAT BETA HCG BLOOD, ED ([facility], [facility], [facility] ONLY)  ? ? ?EKG ?None ? ?Radiology ?No results found. ? ?Procedures ?Procedures  ? ? ?Medications Ordered in ED ?Medications  ?ondansetron (ZOFRAN-ODT) disintegrating tablet 4 mg (has no administration in time range)  ?metoCLOPramide (REGLAN) injection 10 mg (has no administration in time range)  ?diphenhydrAMINE (BENADRYL) injection 12.5 mg (has no administration in time range)  ?famotidine (PEPCID) IVPB 20 mg premix (has no administration in time range)  ?sodium chloride 0.9 % bolus 1,000 mL (has no administration in time range)  ?ketorolac (TORADOL) 15 MG/ML injection 15 mg (has no administration in time range)  ? ? ?ED Course/ Medical Decision Making/ A&P ?  ?                        ?Medical Decision Making ?Risk ?Prescription drug management. ? ? ?26:54 PM ?31 year old female with past medical history of gastroparesis presenting for abdominal pain.  Patient is alert and oriented x3, no acute distress, afebrile, stable vital signs.  Physical exam demonstrates  generalized abdominal tenderness.  Abdomen is soft.  No guarding or rigidity. ? ?I recently saw patient approximately 6 days ago for similar complaints.  Patient had improvement of symptoms with GI cocktail incl

## 2021-05-27 NOTE — Discharge Instructions (Signed)
Continue to take the medication that I prescribed the last time you are discharged.  Please return to emergency department if unable to tolerate p.o.  Follow-up with GI for treatment of chronic gastroparesis. ?

## 2021-05-28 ENCOUNTER — Encounter (HOSPITAL_COMMUNITY): Payer: Self-pay | Admitting: Emergency Medicine

## 2021-05-28 ENCOUNTER — Other Ambulatory Visit: Payer: Self-pay

## 2021-05-28 ENCOUNTER — Observation Stay (HOSPITAL_COMMUNITY)
Admission: EM | Admit: 2021-05-28 | Discharge: 2021-05-30 | Disposition: A | Discharge status: Home / Self Care | Payer: 59 | Attending: Internal Medicine | Admitting: Internal Medicine

## 2021-05-28 ENCOUNTER — Emergency Department (HOSPITAL_COMMUNITY): Payer: 59

## 2021-05-28 DIAGNOSIS — N186 End stage renal disease: Secondary | ICD-10-CM | POA: Diagnosis not present

## 2021-05-28 DIAGNOSIS — F418 Other specified anxiety disorders: Secondary | ICD-10-CM | POA: Diagnosis not present

## 2021-05-28 DIAGNOSIS — Z992 Dependence on renal dialysis: Secondary | ICD-10-CM | POA: Insufficient documentation

## 2021-05-28 DIAGNOSIS — Z794 Long term (current) use of insulin: Secondary | ICD-10-CM | POA: Diagnosis not present

## 2021-05-28 DIAGNOSIS — I120 Hypertensive chronic kidney disease with stage 5 chronic kidney disease or end stage renal disease: Secondary | ICD-10-CM | POA: Diagnosis not present

## 2021-05-28 DIAGNOSIS — R112 Nausea with vomiting, unspecified: Secondary | ICD-10-CM | POA: Diagnosis present

## 2021-05-28 DIAGNOSIS — Z79899 Other long term (current) drug therapy: Secondary | ICD-10-CM | POA: Insufficient documentation

## 2021-05-28 DIAGNOSIS — R1110 Vomiting, unspecified: Secondary | ICD-10-CM

## 2021-05-28 DIAGNOSIS — E1065 Type 1 diabetes mellitus with hyperglycemia: Secondary | ICD-10-CM | POA: Diagnosis not present

## 2021-05-28 DIAGNOSIS — E1022 Type 1 diabetes mellitus with diabetic chronic kidney disease: Secondary | ICD-10-CM | POA: Diagnosis not present

## 2021-05-28 DIAGNOSIS — J45909 Unspecified asthma, uncomplicated: Secondary | ICD-10-CM | POA: Diagnosis not present

## 2021-05-28 DIAGNOSIS — I10 Essential (primary) hypertension: Secondary | ICD-10-CM | POA: Diagnosis present

## 2021-05-28 DIAGNOSIS — K3184 Gastroparesis: Secondary | ICD-10-CM

## 2021-05-28 LAB — CBC WITH DIFFERENTIAL/PLATELET
Abs Immature Granulocytes: 0.03 10*3/uL (ref 0.00–0.07)
Basophils Absolute: 0.1 10*3/uL (ref 0.0–0.1)
Basophils Relative: 1 %
Eosinophils Absolute: 0 10*3/uL (ref 0.0–0.5)
Eosinophils Relative: 0 %
HCT: 38.1 % (ref 36.0–46.0)
Hemoglobin: 12.2 g/dL (ref 12.0–15.0)
Immature Granulocytes: 0 %
Lymphocytes Relative: 19 %
Lymphs Abs: 1.9 10*3/uL (ref 0.7–4.0)
MCH: 27.9 pg (ref 26.0–34.0)
MCHC: 32 g/dL (ref 30.0–36.0)
MCV: 87 fL (ref 80.0–100.0)
Monocytes Absolute: 0.6 10*3/uL (ref 0.1–1.0)
Monocytes Relative: 6 %
Neutro Abs: 7.2 10*3/uL (ref 1.7–7.7)
Neutrophils Relative %: 74 %
Platelets: 500 10*3/uL — ABNORMAL HIGH (ref 150–400)
RBC: 4.38 MIL/uL (ref 3.87–5.11)
RDW: 13.2 % (ref 11.5–15.5)
WBC: 9.7 10*3/uL (ref 4.0–10.5)
nRBC: 0 % (ref 0.0–0.2)

## 2021-05-28 LAB — LIPASE, BLOOD: Lipase: 70 U/L — ABNORMAL HIGH (ref 11–51)

## 2021-05-28 LAB — BASIC METABOLIC PANEL
Anion gap: 17 — ABNORMAL HIGH (ref 5–15)
BUN: 53 mg/dL — ABNORMAL HIGH (ref 6–20)
CO2: 17 mmol/L — ABNORMAL LOW (ref 22–32)
Calcium: 10.3 mg/dL (ref 8.9–10.3)
Chloride: 100 mmol/L (ref 98–111)
Creatinine, Ser: 9.67 mg/dL — ABNORMAL HIGH (ref 0.44–1.00)
GFR, Estimated: 5 mL/min — ABNORMAL LOW (ref >60)
Glucose, Bld: 182 mg/dL — ABNORMAL HIGH (ref 70–99)
Potassium: 3.8 mmol/L (ref 3.5–5.1)
Sodium: 134 mmol/L — ABNORMAL LOW (ref 135–145)

## 2021-05-28 LAB — HEPATIC FUNCTION PANEL
ALT: 17 U/L (ref 0–44)
AST: 22 U/L (ref 15–41)
Albumin: 3.6 g/dL (ref 3.5–5.0)
Alkaline Phosphatase: 48 U/L (ref 38–126)
Bilirubin, Direct: 0.1 mg/dL (ref 0.0–0.2)
Indirect Bilirubin: 0.7 mg/dL (ref 0.3–0.9)
Total Bilirubin: 0.8 mg/dL (ref 0.3–1.2)
Total Protein: 7.3 g/dL (ref 6.5–8.1)

## 2021-05-28 IMAGING — CR DG CHEST 1V
1 series · 1 of 1 positions shown · non-contrast
Comparison: [DATE]

CLINICAL DATA: Fever with nausea and vomiting.

EXAM:
CHEST  1 VIEW

[chest pa]
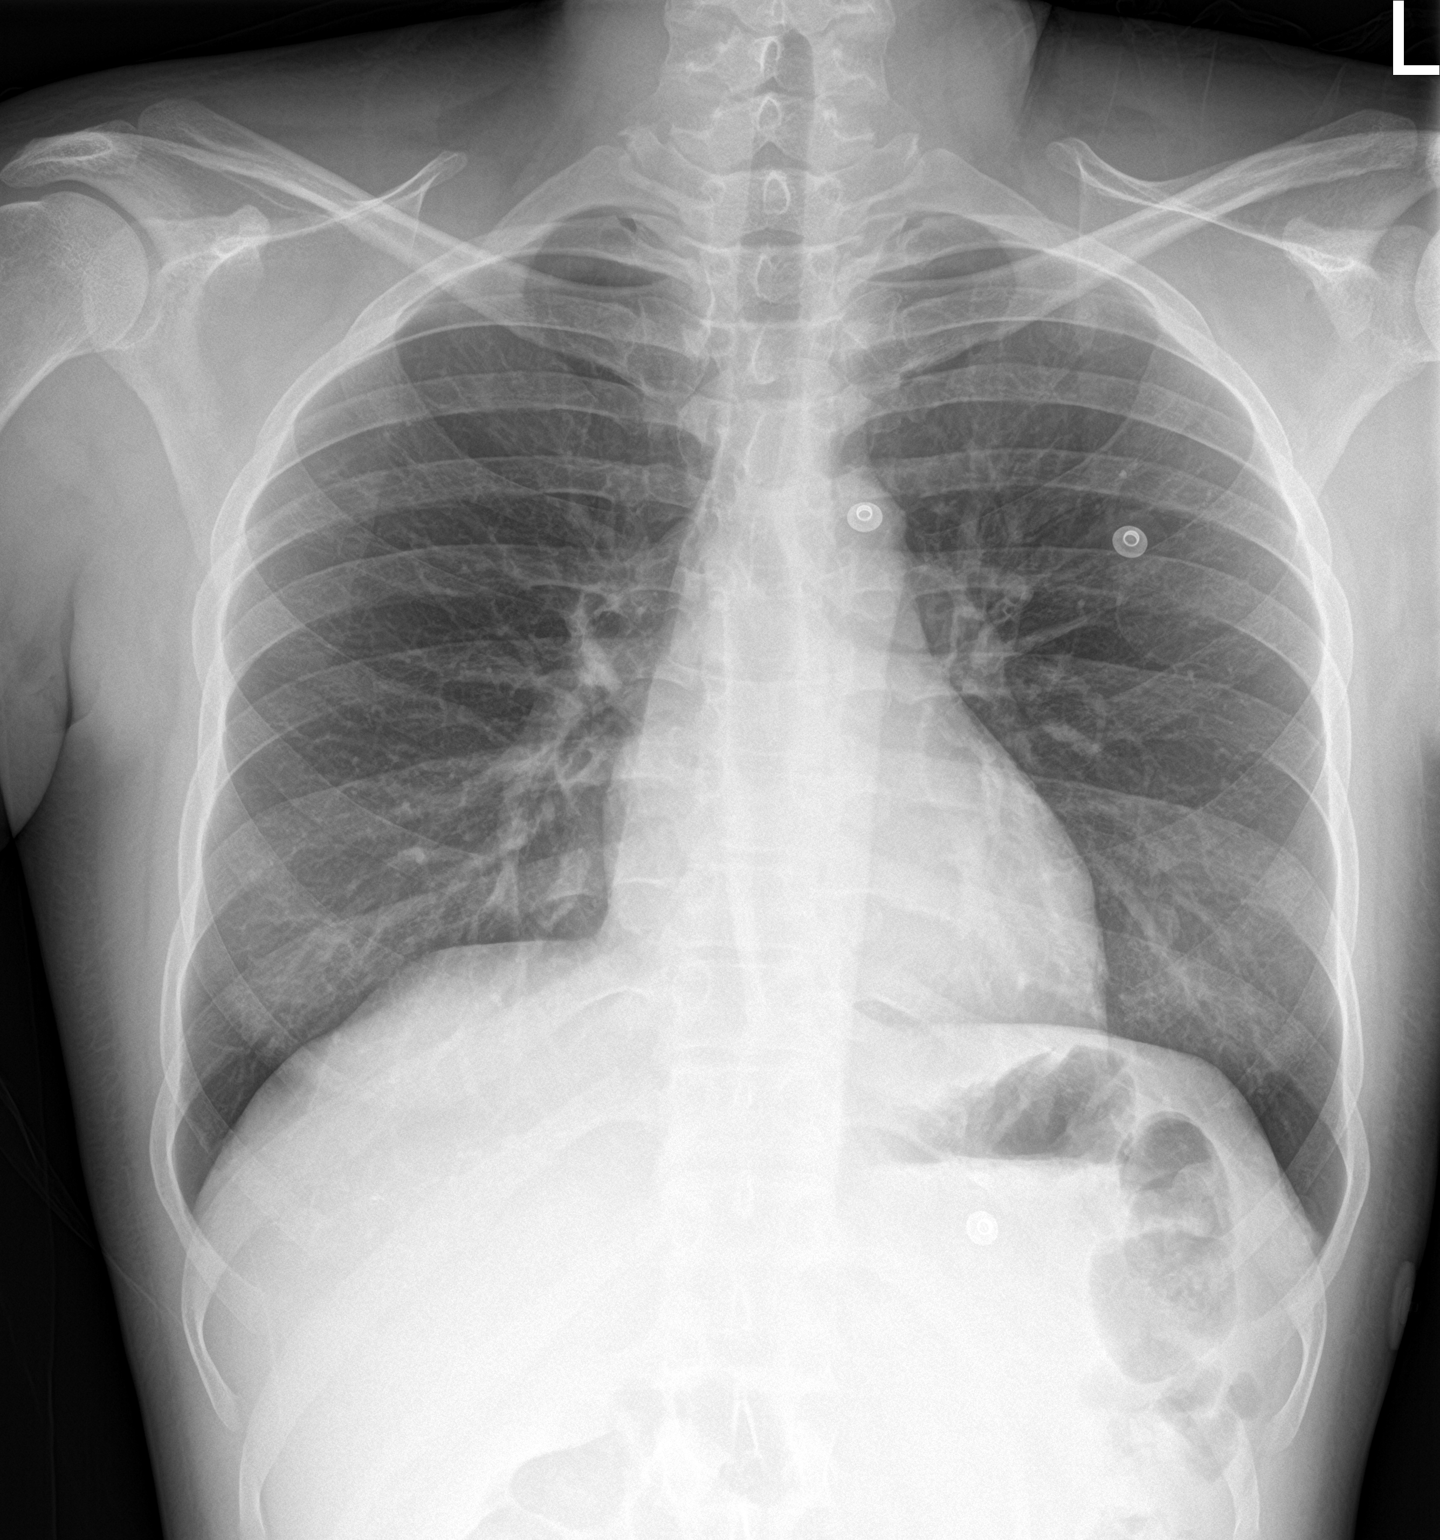

[1 of 1 positions shown; findings below may reference images not displayed]

FINDINGS: The heart is normal in sizethe cardiomediastinal contours are
normal. The lungs are clear. Pulmonary vasculature is normal. No
consolidation, pleural effusion, or pneumothorax. No acute osseous
abnormalities are seen.
IMPRESSION: Negative frontal view of the chest.

## 2021-05-28 MED ORDER — HALOPERIDOL LACTATE 5 MG/ML IJ SOLN
5.0000 mg | Freq: Once | INTRAMUSCULAR | Status: AC
  Administered 2021-05-29: 5 mg via INTRAVENOUS
  Filled 2021-05-28: qty 1

## 2021-05-28 NOTE — ED Provider Notes (Signed)
?[facility] ?Provider Note ? ? ?CSN: [id number] ?Arrival date & time: 05/28/21  1607 ? ?  ? ?History ? ?Chief Complaint  ?Patient presents with  ? Emesis  ? ? ?Barbara Donovan is a 31 y.o. female. ? ?The history is provided by the patient.  ?Emesis ?Severity:  Severe ?Duration: over 2 days. ?Timing:  Intermittent ?Number of daily episodes:  More than 10 ?Quality:  Stomach contents ?Progression:  Unchanged ?Chronicity:  Recurrent ?Recent urination:  Normal ?Context: not post-tussive   ?Relieved by:  Nothing ?Worsened by:  Nothing ?Ineffective treatments:  Antiemetics ?Associated symptoms: no diarrhea, no fever and no URI   ?Risk factors: no alcohol use and not pregnant   ?Patient with ESRD and gastroparesis who presents with nausea and vomiting.  Was seen yesterday and states she was discharged without relief of symptoms. Missed dialysis yesterday. ?  ? ?Home Medications ?Prior to Admission medications   ?Medication Sig Start Date End Date Taking? Authorizing Provider  ?amLODipine (NORVASC) 10 MG tablet Take 1 tablet (10 mg total) by mouth daily. ?Patient taking differently: Take 10 mg by mouth every evening. 03/09/20   Mercy Riding, MD  ?brimonidine (ALPHAGAN) 0.2 % ophthalmic solution Place 1 drop into the left eye 3 (three) times daily. 03/09/21   [provider]  ?calcitRIOL (ROCALTROL) 0.5 MCG capsule Take 0.5 mcg by mouth daily. 12/02/20   [provider]  ?carvedilol (COREG) 25 MG tablet Take 1 tablet (25 mg total) by mouth 2 (two) times daily. 03/07/19   Imogene Burn, PA-C  ?Continuous Blood Gluc Receiver DEVI 1 Units by Does not apply route 4 (four) times daily -  before meals and at bedtime. May substitute for cheapest monitor with insurance 03/27/21   Scarlett Presto, MD  ?Continuous Blood Gluc Sensor MISC 1 each by Does not apply route as directed. Use as directed every 14 days. May dispense FreeStyle [employer] or similar. 01/27/20   Antonieta Pert, MD  ?Continuous Glucose Monitor Sup MISC 1 Units by Does not apply route 4 (four) times daily -  before meals and at bedtime. May substitute for cheapest option with patient insurance 03/27/21 03/27/22  Scarlett Presto, MD  ?FLUoxetine (PROZAC) 20 MG capsule Take 20 mg by mouth daily. 03/16/21   [provider]  ?glucose blood (FREESTYLE TEST STRIPS) test strip Use as instructed 02/25/21   Terrilee Croak, MD  ?glucose blood (ONETOUCH VERIO) test strip 1 each by Other route 3 (three) times daily. And lancets 3/day 11/28/20   Renato Shin, MD  ?hydrALAZINE (APRESOLINE) 50 MG tablet Take 1 tablet by mouth twice a day, may take 1 extra tablet by mouth daily only as needed for elevated blood pressure ?Patient taking differently: Take 50 mg by mouth See admin instructions. Take 50mg  by mouth twice a day, may take an additional 50mg  by mouth daily only as needed for elevated blood pressure. 04/28/21   Imogene Burn, PA-C  ?insulin glargine (LANTUS SOLOSTAR) 100 UNIT/ML Solostar Pen Inject 20 Units into the skin every morning. 05/11/21   Renato Shin, MD  ?insulin lispro (HUMALOG KWIKPEN) 100 UNIT/ML KwikPen 3 times a day (just before each meal) 4-3-3 units, and pen needles 4/day ?Patient taking differently: Inject 3 Units into the skin 3 (three) times daily. 04/21/21   Renato Shin, MD  ?metoCLOPramide (REGLAN) 5 MG tablet Take 1 tablet (5 mg total) by mouth 3 (three) times daily before meals. 02/23/21   Sherwood Gambler,  MD  ?multivitamin (RENA-VIT) TABS tablet Take 1 tablet by mouth at bedtime. 03/08/20   Mercy Riding, MD  ?ondansetron (ZOFRAN-ODT) 4 MG disintegrating tablet Take 1 tablet (4 mg total) by mouth every 6 (six) hours as needed for nausea or vomiting. 04/07/21   Demaio, Alexa, MD  ?potassium chloride SA (KLOR-CON M) 20 MEQ tablet 20 mEq twice daily x2 days and then 20 mEq daily for 3 days. 05/19/21   Sherwood Gambler, MD  ?promethazine (PHENERGAN) 12.5 MG tablet Take 1 tablet (12.5 mg total) by mouth every  6 (six) hours as needed for nausea or vomiting. 04/26/21   Riesa Pope, MD  ?sevelamer carbonate (RENVELA) 800 MG tablet Take 1 tablet (800 mg total) by mouth 3 (three) times daily with meals. 02/05/21   Bonnielee Haff, MD  ?   ? ?Allergies    ?Patient has no known allergies.   ? ?Review of Systems   ?Review of Systems  ?Constitutional:  Negative for fever.  ?HENT:  Negative for facial swelling.   ?Eyes:  Negative for redness.  ?Respiratory:  Negative for wheezing and stridor.   ?Cardiovascular:  Negative for leg swelling.  ?Gastrointestinal:  Positive for nausea and vomiting. Negative for diarrhea.  ?Neurological:  Negative for facial asymmetry.  ?Psychiatric/Behavioral:  Negative for agitation.   ?All other systems reviewed and are negative. ? ?Physical Exam ?Updated Vital Signs ?BP (!) 175/107   Pulse (!) 101   Temp 98.6 ?F (37 ?C) (Oral)   Resp 20   SpO2 98%  ?Physical Exam ?Vitals and nursing note reviewed.  ?Constitutional:   ?   Appearance: Normal appearance. She is not diaphoretic.  ?HENT:  ?   Head: Normocephalic and atraumatic.  ?   Nose: Nose normal.  ?Eyes:  ?   Extraocular Movements: Extraocular movements intact.  ?Cardiovascular:  ?   Rate and Rhythm: Regular rhythm. Tachycardia present.  ?   Pulses: Normal pulses.  ?   Heart sounds: Normal heart sounds.  ?Pulmonary:  ?   Breath sounds: No stridor. No rhonchi.  ?Abdominal:  ?   General: Bowel sounds are normal.  ?   Palpations: Abdomen is soft.  ?   Tenderness: There is no abdominal tenderness. There is no guarding or rebound.  ?   Hernia: No hernia is present.  ?Musculoskeletal:     ?   General: Normal range of motion.  ?   Cervical back: Normal range of motion and neck supple.  ?Skin: ?   General: Skin is warm and dry.  ?   Capillary Refill: Capillary refill takes less than 2 seconds.  ?Neurological:  ?   Mental Status: She is alert.  ?   Deep Tendon Reflexes: Reflexes normal.  ?Psychiatric:     ?   Mood and Affect: Mood normal.     ?    Behavior: Behavior normal.  ? ? ?ED Results / Procedures / Treatments   ?Labs ?(all labs ordered are listed, but only abnormal results are displayed) ?Results for orders placed or performed during the hospital encounter of 05/28/21  ?CBC with Differential  ?Result Value Ref Range  ? WBC 9.7 4.0 - 10.5 K/uL  ? RBC 4.38 3.87 - 5.11 MIL/uL  ? Hemoglobin 12.2 12.0 - 15.0 g/dL  ? HCT 38.1 36.0 - 46.0 %  ? MCV 87.0 80.0 - 100.0 fL  ? MCH 27.9 26.0 - 34.0 pg  ? MCHC 32.0 30.0 - 36.0 g/dL  ? RDW 13.2 11.5 - 15.5 %  ?  Platelets 500 (H) 150 - 400 K/uL  ? nRBC 0.0 0.0 - 0.2 %  ? Neutrophils Relative % 74 %  ? Neutro Abs 7.2 1.7 - 7.7 K/uL  ? Lymphocytes Relative 19 %  ? Lymphs Abs 1.9 0.7 - 4.0 K/uL  ? Monocytes Relative 6 %  ? Monocytes Absolute 0.6 0.1 - 1.0 K/uL  ? Eosinophils Relative 0 %  ? Eosinophils Absolute 0.0 0.0 - 0.5 K/uL  ? Basophils Relative 1 %  ? Basophils Absolute 0.1 0.0 - 0.1 K/uL  ? Immature Granulocytes 0 %  ? Abs Immature Granulocytes 0.03 0.00 - 0.07 K/uL  ?Basic metabolic panel  ?Result Value Ref Range  ? Sodium 134 (L) 135 - 145 mmol/L  ? Potassium 3.8 3.5 - 5.1 mmol/L  ? Chloride 100 98 - 111 mmol/L  ? CO2 17 (L) 22 - 32 mmol/L  ? Glucose, Bld 182 (H) 70 - 99 mg/dL  ? BUN 53 (H) 6 - 20 mg/dL  ? Creatinine, Ser 9.67 (H) 0.44 - 1.00 mg/dL  ? Calcium 10.3 8.9 - 10.3 mg/dL  ? GFR, Estimated 5 (L) >60 mL/min  ? Anion gap 17 (H) 5 - 15  ?Urinalysis, Routine w reflex microscopic Urine, Clean Catch  ?Result Value Ref Range  ? Color, Urine YELLOW YELLOW  ? APPearance HAZY (A) CLEAR  ? Specific Gravity, Urine 1.010 1.005 - 1.030  ? pH 6.0 5.0 - 8.0  ? Glucose, UA >=500 (A) NEGATIVE mg/dL  ? Hgb urine dipstick SMALL (A) NEGATIVE  ? Bilirubin Urine NEGATIVE NEGATIVE  ? Ketones, ur 20 (A) NEGATIVE mg/dL  ? Protein, ur 100 (A) NEGATIVE mg/dL  ? Nitrite NEGATIVE NEGATIVE  ? Leukocytes,Ua NEGATIVE NEGATIVE  ? RBC / HPF 0-5 0 - 5 RBC/hpf  ? WBC, UA 0-5 0 - 5 WBC/hpf  ? Bacteria, UA RARE (A) NONE SEEN  ? Squamous  Epithelial / LPF 0-5 0 - 5  ? Mucus PRESENT   ?Pregnancy, urine  ?Result Value Ref Range  ? Preg Test, Ur NEGATIVE NEGATIVE  ?Lipase, blood  ?Result Value Ref Range  ? Lipase 70 (H) 11 - 51 U/L  ?Hepatic function

## 2021-05-28 NOTE — ED Notes (Signed)
Patient transported to X-ray 

## 2021-05-28 NOTE — ED Provider Triage Note (Signed)
Emergency Medicine Provider Triage Evaluation Note ? ?Barbara Donovan , a 31 y.o. female  was evaluated in triage.  Pt complains of abdominal pain, nausea, vomiting.  History of gastroparesis.  Was evaluated for same yesterday.. ? ?Review of Systems  ?Positive: As above ?Negative: As above ? ?Physical Exam  ?BP (!) 180/106 (BP Location: Right Arm)   Pulse (!) 110   Temp 98.6 ?F (37 ?C) (Oral)   Resp 18   SpO2 100%  ?Gen:   Awake, no distress   ?Resp:  Normal effort  ?MSK:   Moves extremities without difficulty  ?Other:   ? ?Medical Decision Making  ?Medically screening exam initiated at 4:48 PM.  Appropriate orders placed.  Barbara Donovan was informed that the remainder of the evaluation will be completed by another provider, this initial triage assessment does not replace that evaluation, and the importance of remaining in the ED until their evaluation is complete. ? ? ?  ?Evlyn Courier, PA-C ?05/28/21 1649 ? ?

## 2021-05-28 NOTE — ED Triage Notes (Signed)
BIB EMS from home.  Was here yesterday for same.  N/v x 1 week.  Did miss her dialysis yesterday.  ?

## 2021-05-29 ENCOUNTER — Encounter (HOSPITAL_COMMUNITY): Payer: Self-pay | Admitting: Emergency Medicine

## 2021-05-29 ENCOUNTER — Emergency Department (HOSPITAL_COMMUNITY): Payer: 59

## 2021-05-29 DIAGNOSIS — R112 Nausea with vomiting, unspecified: Secondary | ICD-10-CM | POA: Diagnosis not present

## 2021-05-29 DIAGNOSIS — E1065 Type 1 diabetes mellitus with hyperglycemia: Secondary | ICD-10-CM

## 2021-05-29 DIAGNOSIS — K3184 Gastroparesis: Secondary | ICD-10-CM

## 2021-05-29 DIAGNOSIS — N186 End stage renal disease: Secondary | ICD-10-CM | POA: Diagnosis not present

## 2021-05-29 DIAGNOSIS — E1022 Type 1 diabetes mellitus with diabetic chronic kidney disease: Secondary | ICD-10-CM

## 2021-05-29 DIAGNOSIS — R1110 Vomiting, unspecified: Secondary | ICD-10-CM | POA: Diagnosis present

## 2021-05-29 DIAGNOSIS — Z992 Dependence on renal dialysis: Secondary | ICD-10-CM

## 2021-05-29 LAB — URINALYSIS, ROUTINE W REFLEX MICROSCOPIC
Bilirubin Urine: NEGATIVE
Glucose, UA: >=500 mg/dL — AB
Ketones, ur: 20 mg/dL — AB
Leukocytes,Ua: NEGATIVE
Nitrite: NEGATIVE
Protein, ur: 100 mg/dL — AB
Specific Gravity, Urine: 1.01 (ref 1.005–1.030)
pH: 6 (ref 5.0–8.0)

## 2021-05-29 LAB — BODY FLUID CELL COUNT WITH DIFFERENTIAL: Total Nucleated Cell Count, Fluid: 0 cu mm (ref 0–1000)

## 2021-05-29 LAB — COMPREHENSIVE METABOLIC PANEL
ALT: 14 U/L (ref 0–44)
AST: 17 U/L (ref 15–41)
Albumin: 3 g/dL — ABNORMAL LOW (ref 3.5–5.0)
Alkaline Phosphatase: 44 U/L (ref 38–126)
Anion gap: 13 (ref 5–15)
BUN: 57 mg/dL — ABNORMAL HIGH (ref 6–20)
CO2: 18 mmol/L — ABNORMAL LOW (ref 22–32)
Calcium: 9.5 mg/dL (ref 8.9–10.3)
Chloride: 104 mmol/L (ref 98–111)
Creatinine, Ser: 10.1 mg/dL — ABNORMAL HIGH (ref 0.44–1.00)
GFR, Estimated: 5 mL/min — ABNORMAL LOW (ref >60)
Glucose, Bld: 165 mg/dL — ABNORMAL HIGH (ref 70–99)
Potassium: 3.3 mmol/L — ABNORMAL LOW (ref 3.5–5.1)
Sodium: 135 mmol/L (ref 135–145)
Total Bilirubin: 0.7 mg/dL (ref 0.3–1.2)
Total Protein: 6 g/dL — ABNORMAL LOW (ref 6.5–8.1)

## 2021-05-29 LAB — CBC
HCT: 28.4 % — ABNORMAL LOW (ref 36.0–46.0)
Hemoglobin: 9.4 g/dL — ABNORMAL LOW (ref 12.0–15.0)
MCH: 28.4 pg (ref 26.0–34.0)
MCHC: 33.1 g/dL (ref 30.0–36.0)
MCV: 85.8 fL (ref 80.0–100.0)
Platelets: 375 10*3/uL (ref 150–400)
RBC: 3.31 MIL/uL — ABNORMAL LOW (ref 3.87–5.11)
RDW: 13.2 % (ref 11.5–15.5)
WBC: 8.2 10*3/uL (ref 4.0–10.5)
nRBC: 0 % (ref 0.0–0.2)

## 2021-05-29 LAB — PREGNANCY, URINE: Preg Test, Ur: NEGATIVE

## 2021-05-29 LAB — GLUCOSE, CAPILLARY
Glucose-Capillary: 146 mg/dL — ABNORMAL HIGH (ref 70–99)
Glucose-Capillary: 119 mg/dL — ABNORMAL HIGH (ref 70–99)
Glucose-Capillary: 229 mg/dL — ABNORMAL HIGH (ref 70–99)
Glucose-Capillary: 167 mg/dL — ABNORMAL HIGH (ref 70–99)

## 2021-05-29 LAB — BETA-HYDROXYBUTYRIC ACID: Beta-Hydroxybutyric Acid: 0.69 mmol/L — ABNORMAL HIGH (ref 0.05–0.27)

## 2021-05-29 LAB — LACTIC ACID, PLASMA: Lactic Acid, Venous: 0.8 mmol/L (ref 0.5–1.9)

## 2021-05-29 LAB — CBG MONITORING, ED: Glucose-Capillary: 171 mg/dL — ABNORMAL HIGH (ref 70–99)

## 2021-05-29 IMAGING — CT CT RENAL STONE PROTOCOL
2 of 4 series · 16 of 46 positions shown, 18 images · non-contrast
Comparison: None Available.

CLINICAL DATA: Nausea and vomiting x1 week.



[Series 3: renal stone 5.0 · axial · 0.75mm/px · z∈[+758,+1193]mm · 13 of 95 slices shown, 15 images]
[im 4/95  soft-tissue]
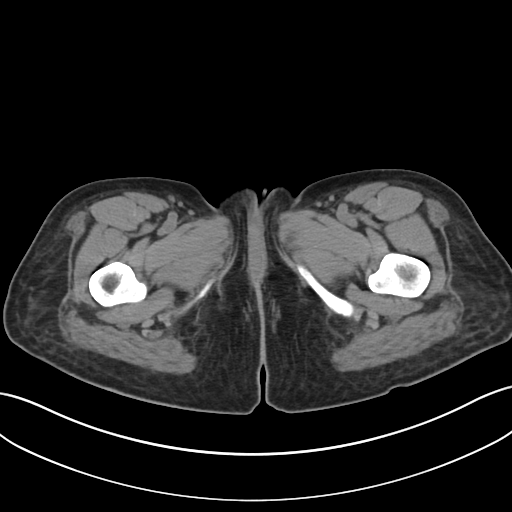
[im 4/95  bone]
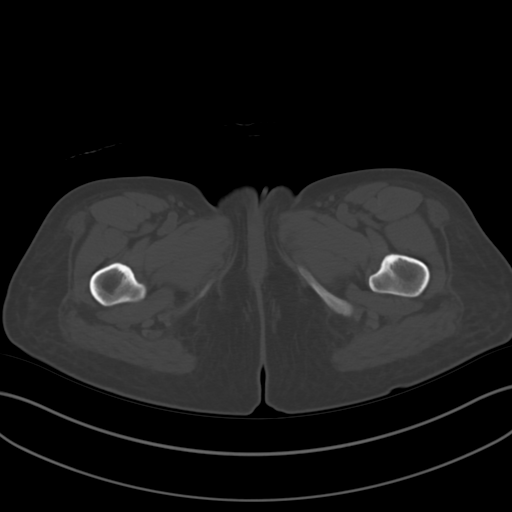
[im 11/95  soft-tissue]
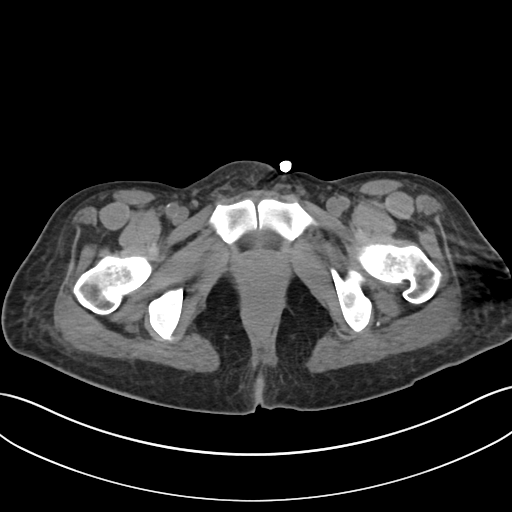
[im 19/95  soft-tissue]
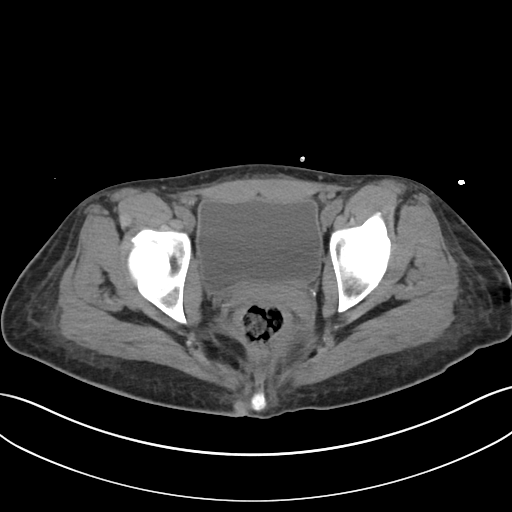
[im 26/95  soft-tissue]
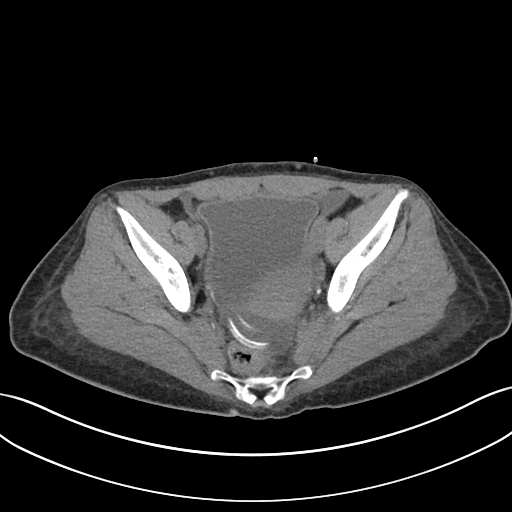
[im 33/95  soft-tissue]
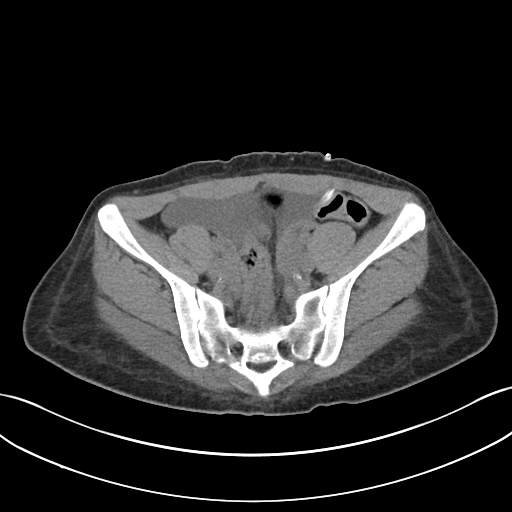
[im 40/95  soft-tissue]
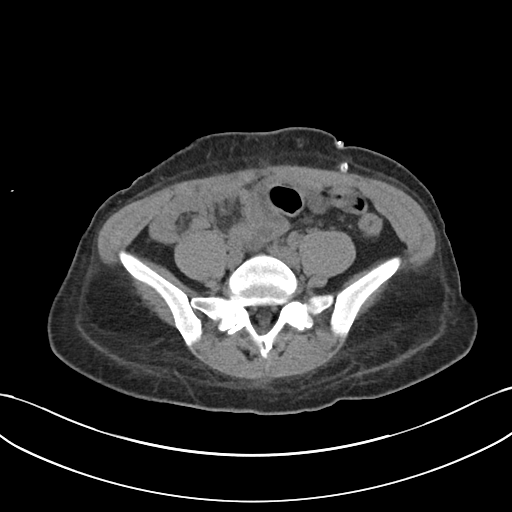
[im 48/95  soft-tissue]
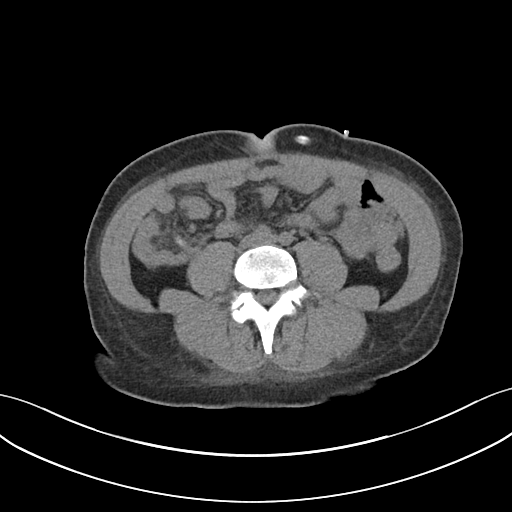
[im 55/95  soft-tissue]
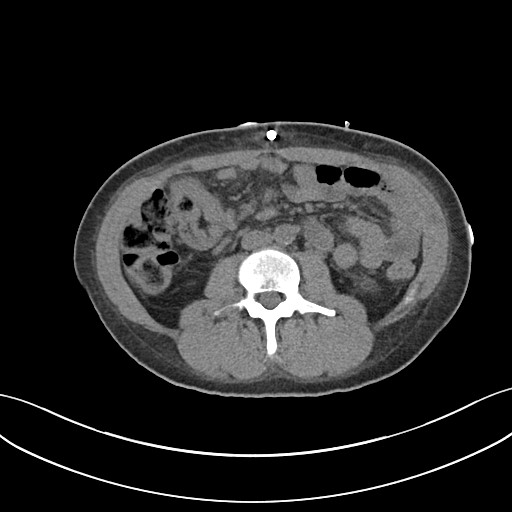
[im 62/95  soft-tissue]
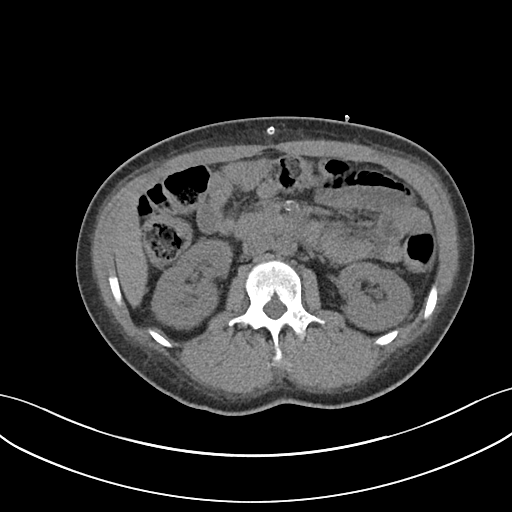
[im 62/95  bone]
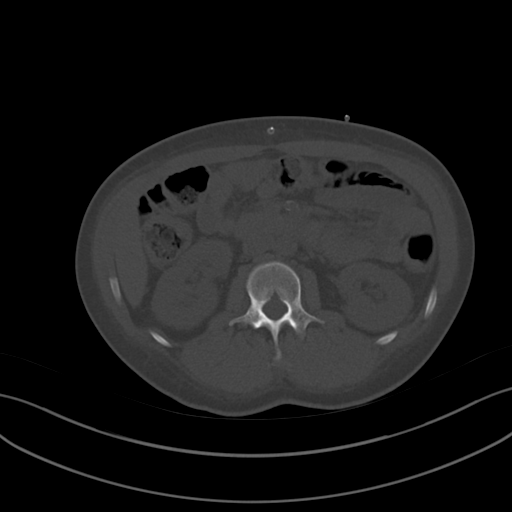
[im 69/95  soft-tissue]
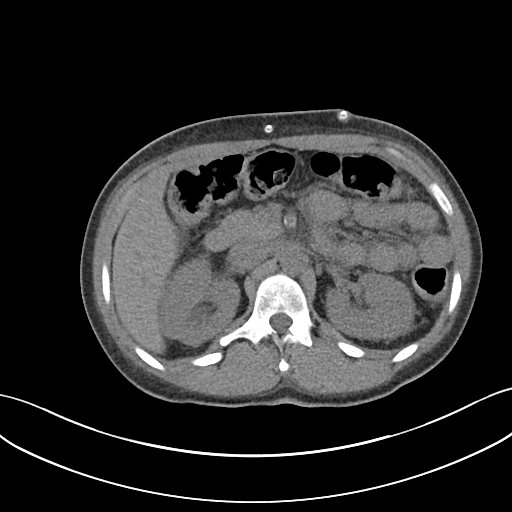
[im 76/95  soft-tissue]
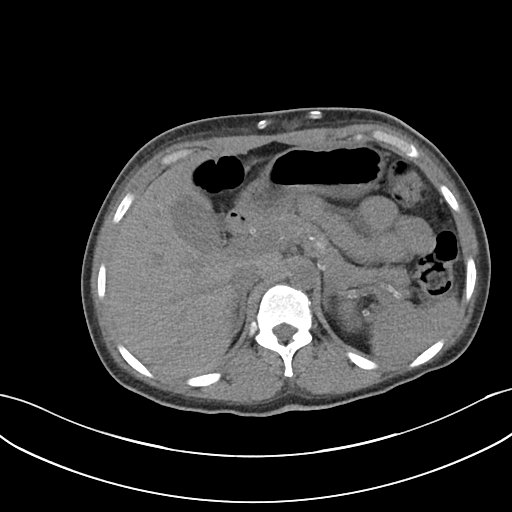
[im 84/95  soft-tissue]
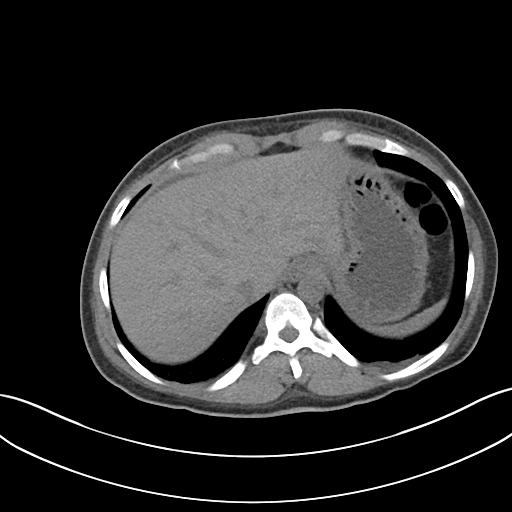
[im 91/95  soft-tissue]
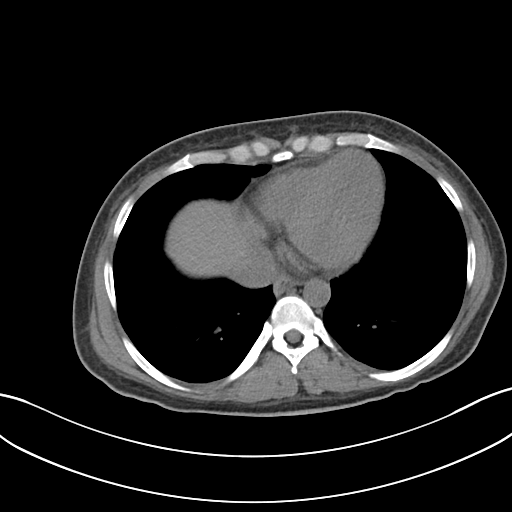

[Series 6: cor · coronal · 0.76mm/px · 3 of 131 slices shown]
[im 44/131  soft-tissue]
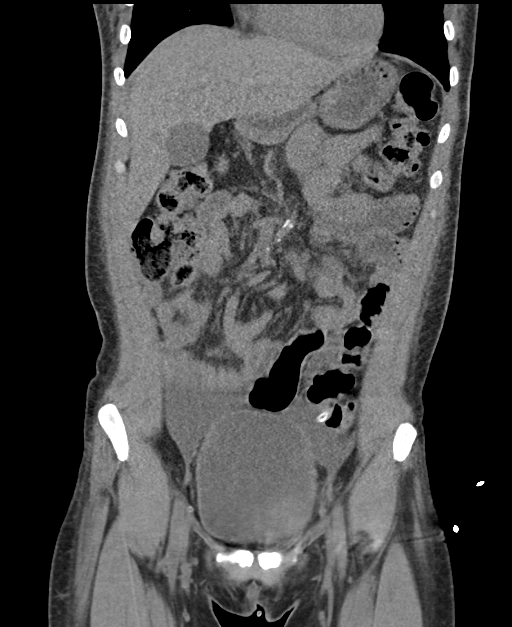
[im 58/131  soft-tissue]
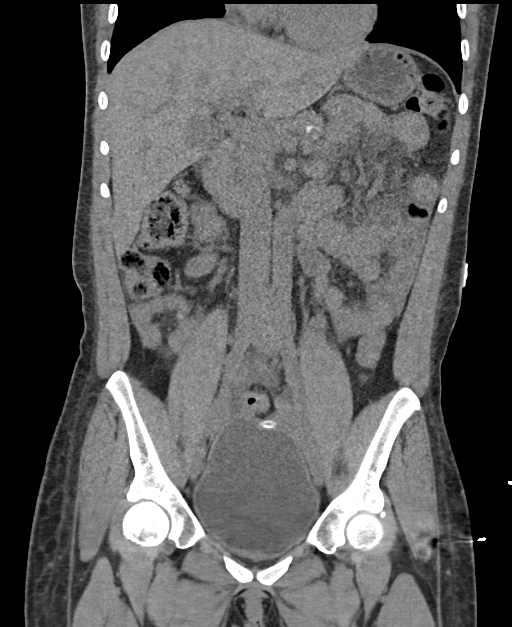
[im 73/131  soft-tissue]
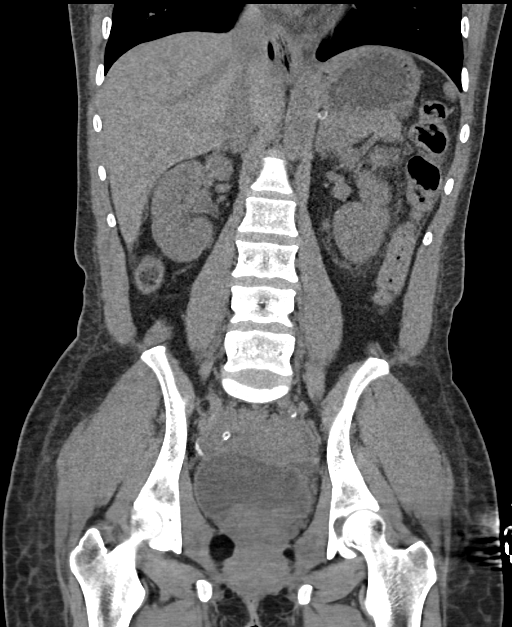

[16 of 46 positions shown; findings below may reference images not displayed]

FINDINGS: Lower chest: Very mild atelectasis is seen along the posterior
aspect of the bilateral lung bases.

Hepatobiliary: No focal liver abnormality is seen. No gallstones,
gallbladder wall thickening, or biliary dilatation.

Pancreas: Unremarkable. No pancreatic ductal dilatation or
surrounding inflammatory changes.

Spleen: Normal in size without focal abnormality.

Adrenals/Urinary Tract: Adrenal glands are unremarkable. Kidneys are
normal, without renal calculi, focal lesion, or hydronephrosis. The
urinary bladder is moderately distended and otherwise unremarkable.

Stomach/Bowel: Stomach is within normal limits. Appendix appears
normal. No evidence of bowel wall thickening, distention, or
inflammatory changes.

Vascular/Lymphatic: No significant vascular findings are present. No
enlarged abdominal or pelvic lymph nodes.

Reproductive: Uterus and bilateral adnexa are unremarkable.

Other: No abdominal wall hernia or abnormality.

A peritoneal dialysis catheter is seen with its distal end looped
within the posterior aspect of the pelvis.

There is a mild amount of pelvic free fluid.

Musculoskeletal: No acute or significant osseous findings.
IMPRESSION: 1. Mild amount of pelvic free fluid, likely secondary to the
patient's peritoneal dialysis catheter.
2. No acute or active process within the abdomen or pelvis.

## 2021-05-29 MED ORDER — HYDRALAZINE HCL 50 MG PO TABS
50.0000 mg | ORAL_TABLET | Freq: Two times a day (BID) | ORAL | Status: DC
  Administered 2021-05-29 – 2021-05-30 (×4): 50 mg via ORAL
  Filled 2021-05-29 (×2): qty 1
  Filled 2021-05-29: qty 2
  Filled 2021-05-29: qty 1

## 2021-05-29 MED ORDER — GENTAMICIN SULFATE 0.1 % EX CREA
1.0000 "application " | TOPICAL_CREAM | Freq: Every day | CUTANEOUS | Status: DC
  Administered 2021-05-30: 1 via TOPICAL
  Filled 2021-05-29: qty 15

## 2021-05-29 MED ORDER — DELFLEX-LC/1.5% DEXTROSE 344 MOSM/L IP SOLN
INTRAPERITONEAL | Status: DC
  Filled 2021-05-29 (×2): qty 3000

## 2021-05-29 MED ORDER — METOCLOPRAMIDE HCL 5 MG PO TABS
5.0000 mg | ORAL_TABLET | Freq: Three times a day (TID) | ORAL | Status: DC
Start: 2021-05-29 — End: 2021-05-30
  Administered 2021-05-29 – 2021-05-30 (×4): 5 mg via ORAL
  Filled 2021-05-29 (×4): qty 1

## 2021-05-29 MED ORDER — HEPARIN 1000 UNIT/ML FOR PERITONEAL DIALYSIS
INTRAPERITONEAL | Status: DC
  Filled 2021-05-29 (×3): qty 5000

## 2021-05-29 MED ORDER — HEPARIN SODIUM (PORCINE) 5000 UNIT/ML IJ SOLN
5000.0000 [IU] | Freq: Three times a day (TID) | INTRAMUSCULAR | Status: DC
  Filled 2021-05-29 (×2): qty 1

## 2021-05-29 MED ORDER — SEVELAMER CARBONATE 800 MG PO TABS
800.0000 mg | ORAL_TABLET | Freq: Three times a day (TID) | ORAL | Status: DC
Start: 2021-05-29 — End: 2021-05-30
  Administered 2021-05-29 – 2021-05-30 (×3): 800 mg via ORAL
  Filled 2021-05-29 (×4): qty 1

## 2021-05-29 MED ORDER — SODIUM CHLORIDE 0.9 % IV SOLN: INTRAVENOUS | Status: DC

## 2021-05-29 MED ORDER — INSULIN ASPART 100 UNIT/ML IJ SOLN: 0.0000 [IU] | Freq: Every day | INTRAMUSCULAR | Status: DC

## 2021-05-29 MED ORDER — SODIUM CHLORIDE 0.9 % IV SOLN
INTRAVENOUS | Status: AC
Start: 2021-05-29 — End: 2021-05-29

## 2021-05-29 MED ORDER — DELFLEX-LC/1.5% DEXTROSE 344 MOSM/L IP SOLN: INTRAPERITONEAL | Status: DC

## 2021-05-29 MED ORDER — ACETAMINOPHEN 650 MG RE SUPP: 650.0000 mg | Freq: Four times a day (QID) | RECTAL | Status: DC | PRN

## 2021-05-29 MED ORDER — CALCITRIOL 0.5 MCG PO CAPS
0.5000 ug | ORAL_CAPSULE | Freq: Every day | ORAL | Status: DC
  Administered 2021-05-29 – 2021-05-30 (×2): 0.5 ug via ORAL
  Filled 2021-05-29 (×2): qty 1

## 2021-05-29 MED ORDER — POTASSIUM CHLORIDE 20 MEQ PO PACK: 40.0000 meq | PACK | Freq: Once | ORAL | Status: DC

## 2021-05-29 MED ORDER — ONDANSETRON HCL 4 MG PO TABS: 4.0000 mg | ORAL_TABLET | Freq: Four times a day (QID) | ORAL | Status: DC | PRN

## 2021-05-29 MED ORDER — RENA-VITE PO TABS
1.0000 | ORAL_TABLET | Freq: Every day | ORAL | Status: DC
  Administered 2021-05-29: 1 via ORAL
  Filled 2021-05-29: qty 1

## 2021-05-29 MED ORDER — FLUOXETINE HCL 20 MG PO CAPS
20.0000 mg | ORAL_CAPSULE | Freq: Every day | ORAL | Status: DC
  Administered 2021-05-29 – 2021-05-30 (×2): 20 mg via ORAL
  Filled 2021-05-29 (×2): qty 1

## 2021-05-29 MED ORDER — FAMOTIDINE IN NACL 20-0.9 MG/50ML-% IV SOLN
20.0000 mg | Freq: Once | INTRAVENOUS | Status: AC
  Administered 2021-05-29: 20 mg via INTRAVENOUS
  Filled 2021-05-29: qty 50

## 2021-05-29 MED ORDER — NEPRO/CARBSTEADY PO LIQD: 237.0000 mL | Freq: Two times a day (BID) | ORAL | Status: DC

## 2021-05-29 MED ORDER — AMLODIPINE BESYLATE 10 MG PO TABS
10.0000 mg | ORAL_TABLET | Freq: Every day | ORAL | Status: DC
  Administered 2021-05-29 – 2021-05-30 (×2): 10 mg via ORAL
  Filled 2021-05-29 (×2): qty 1

## 2021-05-29 MED ORDER — CARVEDILOL 25 MG PO TABS
25.0000 mg | ORAL_TABLET | Freq: Two times a day (BID) | ORAL | Status: DC
Start: 2021-05-29 — End: 2021-05-30
  Administered 2021-05-29 – 2021-05-30 (×3): 25 mg via ORAL
  Filled 2021-05-29 (×3): qty 1

## 2021-05-29 MED ORDER — ONDANSETRON HCL 4 MG/2ML IJ SOLN: 4.0000 mg | Freq: Four times a day (QID) | INTRAMUSCULAR | Status: DC | PRN

## 2021-05-29 MED ORDER — ACETAMINOPHEN 325 MG PO TABS: 650.0000 mg | ORAL_TABLET | Freq: Four times a day (QID) | ORAL | Status: DC | PRN

## 2021-05-29 MED ORDER — INSULIN ASPART 100 UNIT/ML IJ SOLN
0.0000 [IU] | Freq: Three times a day (TID) | INTRAMUSCULAR | Status: DC
  Administered 2021-05-29: 5 [IU] via SUBCUTANEOUS

## 2021-05-29 MED ORDER — INSULIN GLARGINE-YFGN 100 UNIT/ML ~~LOC~~ SOLN
20.0000 [IU] | Freq: Every day | SUBCUTANEOUS | Status: DC
Start: 2021-05-29 — End: 2021-05-30
  Administered 2021-05-29 – 2021-05-30 (×2): 20 [IU] via SUBCUTANEOUS
  Filled 2021-05-29 (×2): qty 0.2

## 2021-05-29 NOTE — Care Management Obs Status (Signed)
MEDICARE OBSERVATION STATUS NOTIFICATION ? ? ?Patient Details  ?Name: Barbara Donovan ?MRN: [id number] ?Date of Birth: Dec 05, 1990 ? ? ?Medicare Observation Status Notification Given:  Yes ? ? ? ?Tom-Johnson, Renea Ee, RN ?05/29/2021, 2:07 PM ?

## 2021-05-29 NOTE — Progress Notes (Signed)
? ? ?  Subjective:  ?Overnight:NAEON.  ? ?Patient states she is doing well this am. No nausea or vomiting. She feels like she can tolerate food and feels like eating. She inquired if she was going to be discharged before Mother's Day.  ? ? ?Objective: ? ?Vital signs in last 24 hours: ?Vitals:  ? 05/29/21 0543 05/29/21 0645 05/29/21 7262 05/29/21 1205  ?BP: (!) 159/104 (!) 160/99 (!) 143/96 [id number]  ?Pulse: 89 79 88 87  ?Resp: 17 18 18 16   ?Temp: 98.2 ?F (36.8 ?C) 98 ?F (36.7 ?C) 98.7 ?F (37.1 ?C) 98.1 ?F (36.7 ?C)  ?TempSrc: Oral  Oral Oral  ?SpO2: 99% 98% 99% 98%  ? ?Supplemental O2: Room Air ?SpO2: 98 % ?There were no vitals filed for this visit. ? ?Intake/Output Summary (Last 24 hours) at 05/29/2021 1224 ?Last data filed at 05/29/2021 [phone] ?Gross per 24 hour  ?Intake 893.11 ml  ?Output 0 ml  ?Net 893.11 ml  ? ?Net IO Since Admission: 893.11 mL [05/29/21 1224] ?Physical Exam ?General: NAD, laying in bed comfortably. ?HENT: NCAT ?Lungs: CTAB, normal work of breathing on room air   ?Cardiovascular: NSR, 2+ radial pulses ?Abdomen: no TTP, normal bowel sounds ?MSK: no asymmetry ?Skin: no lesions ?Neuro: alert and oriented x4 ?Psych: depressed mood ? ?\Assessment/Plan: ?Barbara Donovan is a 31 year old female with past medical history of type 1 diabetes, ESRD on peritoneal dialysis, hypertension, gastroparesis who presents to the ED for nausea and vomiting secondary to gastroparesis. ? ?Principal Problem: ?  Emesis ?Active Problems: ?  Chronic hypertension ?  Type 1 diabetes mellitus with hyperglycemia (HCC) ?  ESRD (end stage renal disease) on PD ? ?Emesis 2/2 Gastroparesis ?Improved. Patient endorsed resolution of her nausea and vomiting. She stated she feels like eating. If she has recurrent nausea and vomiting, we can start erythromycin for increased motility. Will not add currently as patient has clinically improved. LA has resolved. She may be able to discharge later today if she tolerates her diet.  ?-Reglan 5 mg TID  with meals ?-Zofran PRN ?-ADAT ? ?Type 1 diabetes mellitus ?Chronic. Patient was diagnosed at age 49. Currently managed outpatient with Lantus 20 units daily and humaLOG 4 units with meals. A1c 6.7 in 03/2021. It might be higher given higher readings reported by the patient. Will need good glycemic control to prevenet complications of diabetes. Anion gap resolved on repeat lab work.  ?-Semglee 20 units daily, will hold mealtime insulin until she is able to tolerate PO intake ?-SSI with moderate scale ?  ?ESRD on peritoneal dialysis ?Chronic. Patient undergoes daily peritoneal dialysis. She reports missing her session 05/11 due to feeling poorly. BUN/Cr 53/9.67 with GFR 17, not markedly increased from previous values. ?-nephrology  consulted ?-Continue calcitriol, renvela ?  ?Hypertension ?Chronic. Improved since admission.  ?-Continue home amlodipine, carvedilol, hydralazine ?  ?Anxiety with depression ?Chronic. ?-Continue home fluoxetine ?  ? ?Diet: ADAT to renal ?IVF: NA ?VTE: Lovenox ?Code: Full ? ?Prior to Admission Living Arrangement: Home ?Anticipated Discharge Location: Home ?Barriers to Discharge: Medical stability ?Dispo: Anticipated discharge in approximately 1 day(s).  ? ?Barbara Schuller, MD ?Barbara Donovan. [facility] ?Internal Medicine Residency, PGY-1  ?Pager: [phone] ?After 5 pm and on weekends: Please call the on-call pager  ?

## 2021-05-29 NOTE — Progress Notes (Signed)
Pt receives PD care at [facility]. Contacted clinic and spoke to [place], Therapist, sports. Clinic aware pt currently hospitalized. Will assist as needed.  ? ?Melven Sartorius ?Renal Navigator ?[phone] ?

## 2021-05-29 NOTE — Discharge Summary (Signed)
? ?Name: Barbara Donovan ?MRN: [id number] ?DOB: [birth year] 31 y.o. ?PCP: Timothy Lasso, MD ? ?Date of Admission: 05/28/2021  4:07 PM ?Date of Discharge: 05/30/2021 ?Attending Physician: Velna Ochs, MD ? ?Discharge Diagnosis: ?Principal Problem: ?  Emesis ?Active Problems: ?  Chronic hypertension ?  Type 1 diabetes mellitus with hyperglycemia (HCC) ?  ESRD (end stage renal disease) on PD ?Anxiety and depression ? ?Discharge Medications: ?Allergies as of 05/30/2021   ?No Known Allergies ?  ? ?  ?Medication List  ?  ? ?STOP taking these medications   ? ?potassium chloride SA 20 MEQ tablet ?Commonly known as: KLOR-CON M ?  ? ?  ? ?TAKE these medications   ? ?amLODipine 10 MG tablet ?Commonly known as: NORVASC ?Take 1 tablet (10 mg total) by mouth daily. ?What changed: when to take this ?  ?brimonidine 0.2 % ophthalmic solution ?Commonly known as: ALPHAGAN ?Place 1 drop into the left eye 3 (three) times daily. ?  ?calcitRIOL 0.5 MCG capsule ?Commonly known as: ROCALTROL ?Take 0.5 mcg by mouth daily. ?  ?carvedilol 25 MG tablet ?Commonly known as: COREG ?Take 1 tablet (25 mg total) by mouth 2 (two) times daily. ?  ?Continuous Blood Gluc Receiver Devi ?1 Units by Does not apply route 4 (four) times daily -  before meals and at bedtime. May substitute for cheapest monitor with insurance ?  ?Continuous Blood Gluc Sensor Misc ?1 each by Does not apply route as directed. Use as directed every 14 days. May dispense FreeStyle [employer] or similar. ?  ?Continuous Glucose Monitor Sup Misc ?1 Units by Does not apply route 4 (four) times daily -  before meals and at bedtime. May substitute for cheapest option with patient insurance ?  ?ferrous sulfate 325 (65 FE) MG tablet ?Take 325 mg by mouth daily. ?  ?FLUoxetine 40 MG capsule ?Commonly known as: PROZAC ?Take 40 mg by mouth every morning. ?  ?FREESTYLE TEST STRIPS test strip ?Generic drug: glucose blood ?Use as instructed ?What changed: Another medication with  the same name was removed. Continue taking this medication, and follow the directions you see here. ?  ?hydrALAZINE 50 MG tablet ?Commonly known as: APRESOLINE ?Take 1 tablet by mouth twice a day, may take 1 extra tablet by mouth daily only as needed for elevated blood pressure ?What changed:  ?how much to take ?how to take this ?when to take this ?additional instructions ?  ?insulin lispro 100 UNIT/ML KwikPen ?Commonly known as: HumaLOG KwikPen ?3 times a day (just before each meal) 4-3-3 units, and pen needles 4/day ?What changed:  ?how much to take ?how to take this ?when to take this ?additional instructions ?  ?Lantus SoloStar 100 UNIT/ML Solostar Pen ?Generic drug: insulin glargine ?Inject 20 Units into the skin every morning. ?  ?metoCLOPramide 5 MG tablet ?Commonly known as: REGLAN ?Take 1 tablet (5 mg total) by mouth 3 (three) times daily before meals. ?  ?multivitamin Tabs tablet ?Take 1 tablet by mouth at bedtime. ?  ?ondansetron 4 MG disintegrating tablet ?Commonly known as: ZOFRAN-ODT ?Take 1 tablet (4 mg total) by mouth every 6 (six) hours as needed for nausea or vomiting. ?  ?promethazine 12.5 MG tablet ?Commonly known as: PHENERGAN ?Take 1 tablet (12.5 mg total) by mouth every 6 (six) hours as needed for nausea or vomiting. ?  ?sevelamer carbonate 800 MG tablet ?Commonly known as: RENVELA ?Take 1 tablet (800 mg total) by mouth 3 (three) times daily with meals. ?  ? ?  ? ?  ?  ? ? ?  ?  Discharge Care Instructions  ?(From admission, onward)  ?  ? ? ?  ? ?  Start     Ordered  ? 05/30/21 0000  No dressing needed       ? 05/30/21 1010  ? ?  ?  ? ?  ? ? ?Disposition and follow-up:   ?BarbaraNATASHIA Donovan was discharged from [facility] in Stable condition.  At the hospital follow up visit please address: ? ?Emesis: Resolved by day of discharge. Consider add a macrolide on an as needed basis for gastroparesis flare ups to limit ED visits. Ensure patient is able to tolerate PO intake and all  her medications.  ? ?2.  T1DM: Patient reported higher fasting sugars with values reported around 150. Ensure patient is adherent to her current regimen.  ? ?3.  Labs / imaging needed at time of follow-up: BMP ? ?4.  Pending labs/ test needing follow-up: None ? ?Follow-up Appointments: ?Advised to follow up with PCP in 1 week ? ?Hospital Course by problem list: ?Barbara Donovan is a 31 year old female with past medical history of type 1 diabetes, ESRD on peritoneal dialysis, hypertension, gastroparesis who was admitted for nausea and vomiting secondary to her gastroparesis.  ?  ?Emesis 2/2 Gastroparesis ?CT renal stone study showed mild amount of pelvic free fluid, likely secondary to the patient's peritoneal dialysis catheter but no acute intra-abdominal pathology. Patient endorsed improvement in her nausea and vomiting after 5 mg of haldol injection. She was able to tolerate liquid diet so her diet was advanced as tolerated. She is on maximum dosage of reglan given her ESRD but a macrolide can be considered as a prokinetic if this continues. That may be more appropriate for as needed basis as continued use of it can lead to tachyphylaxis. Patient tolerating her renal diet without any nausea or vomiting the night before discharge. Patient felt back to her baseline on day of discharge. Discharged home on home medications with plan to follow-up with PCP in 1 week. ? ?Type 1 diabetes mellitus ?Chronic. Patient was diagnosed at age 31. Currently managed outpatient with Lantus 20 units daily and humaLOG 4 units with meals. A1c 6.7 in 03/2021. It might be higher given higher readings reported by the patient. Will need good glycemic control to prevenet complications of diabetes. Anion gap initially present but resolved on repeat lab work. She did not appear to be in DKA but was suffering from worsening of her gastroparesis.  She was put on Semglee 20 units daily along with sliding scale insulin during her short  hospitalization and discharged home on her home regimen. ? ?ESRD on peritoneal dialysis ?Chronic. Patient undergoes daily peritoneal dialysis. She reports missing her session 05/11 due to feeling poorly. BUN/Cr 53/9.67 with GFR 17, not markedly increased from previous values. Nephrology consulted and they recommended performing PD overnight. Her calcitriol, renvela were continued. Patient tolerated her PD overnight without any complications and discharged home to continue home PD. ?  ?Hypertension ?Chronic. Improved since admission. Home amlodipine, carvedilol, hydralazine were continued. ?  ?Anxiety with depression ?Chronic.Home fluoxetine was continued. ?  ?Discharge Subjective:  ?Patient evaluated at the bedside sitting in bed. Patient reports that she tolerated her diet okay yesterday.  She did not have any more vomiting overnight. She denies any abdominal pain, shortness of breath or nausea.  Patient ready to go home today. ? ?Discharge Exam:   ?BP 111/70 (BP Location: Left Arm)   Pulse 86   Temp 98.5 ?F (36.9 ?C) (  Oral)   Resp 19   SpO2 100%  ?Physical Exam:  ?General: Pleasant, well-appearing young female sitting in bed. No acute distress. ?CV: RRR. No murmurs, rubs, or gallops. No LE edema ?Pulmonary: Lungs CTAB. Normal effort. No wheezing or rales. ?Abdominal: Soft, nontender, nondistended. Normal bowel sounds. ?Skin: Warm and dry. No obvious rash or lesions. ?Neuro: A&Ox3. Moves all extremities. ?Psych: Normal mood and affect ? ? ?Pertinent Labs, Studies, and Procedures:  ? ?  Latest Ref Rng & Units 05/29/2021  ?  3:56 AM 05/28/2021  ?  4:19 PM 05/27/2021  ?  3:38 PM  ?CBC  ?WBC 4.0 - 10.5 K/uL 8.2   9.7   10.7    ?Hemoglobin 12.0 - 15.0 g/dL 9.4   12.2   11.9    ?Hematocrit 36.0 - 46.0 % 28.4   38.1   35.8    ?Platelets 150 - 400 K/uL 375   500   470    ?  ? ?  Latest Ref Rng & Units 05/29/2021  ?  3:56 AM 05/28/2021  ?  4:19 PM 05/27/2021  ?  3:38 PM  ?CMP  ?Glucose 70 - 99 mg/dL 165   182   163    ?BUN 6  - 20 mg/dL 57   53   50    ?Creatinine 0.44 - 1.00 mg/dL 10.10   9.67   8.81    ?Sodium 135 - 145 mmol/L 135   134   137    ?Potassium 3.5 - 5.1 mmol/L 3.3   3.8   3.8    ?Chloride 98 - 111 mmol/L 104   100   102    ?C

## 2021-05-29 NOTE — H&P (Signed)
? ? ? ?Date: 05/29/2021     ?     ?     ?Patient Name:  Barbara Donovan MRN: [id number]  ?DOB: 07-06-1990 Age / Sex: 31 y.o., female   ?PCP: Timothy Lasso, MD    ?     ?Medical Service: Internal Medicine Teaching Service    ?     ?Attending Physician: Dr. Velna Ochs, MD    ?First Contact: Dr. Humphrey Rolls Pager: [phone]  ?Second Contact: Dr. Coy Saunas Pager: [phone]  ?     ?After Hours (After 5p/  First Contact Pager: [phone]  ?weekends / holidays): Second Contact Pager: [phone]  ? ?Chief Complaint: Nausea/vomiting ? ?History of Present Illness:  ? ?Barbara Donovan is a 31 year old female with past medical history of type 1 diabetes, ESRD on peritoneal dialysis, hypertension, gastroparesis who presents to the ED for nausea and vomiting. ? ?Patient reports nausea/vomiting started on 5/10.  Reports about 10 episodes a day.  She has not been tolerating p.o. well and unable to keep her medication down.  She was seen in the ED on 5/10 and was discharged.  Say that symptom has not improved so she went back to the ED. ? ?Patient denies any new food, recent travel or sick contact.  She denies abdominal pain, chest pain, shortness of breath, fever, chills, urinary symptoms, headache, increased thirst or urinary frequency, lightheaded or dizziness.  She was given 1 dose of Haldol last night which helped control her symptoms.  She has not had an emesis episode for the last few hours.  She states that she has had DKA in the past but this does not feel like a DKA episode. ? ?Patient has gastroparesis at baseline but symptoms are well controlled with Reglan.  Patient still have Reglan at home. ? ?Patient is giving 4 units of Lantus with Humalog at home.  She has not administered insulin yesterday because she was not eating.  She also missed PD sectioned yesterday due to not feeling well. ? ?In the ED, patient was hypertensive but otherwise vital sign are within normal limits.  CBC only showed mild thrombocytosis without  leukocytosis.  CMP showed anion gap metabolic acidosis.  CT abdomen was unrevealing.  Patient will be admitted for symptom control of nausea/vomiting in the setting of gastroparesis and further work-up. ? ?Meds:  ?Current Meds  ?Medication Sig  ? amLODipine (NORVASC) 10 MG tablet Take 1 tablet (10 mg total) by mouth daily. (Patient taking differently: Take 10 mg by mouth every evening.)  ? brimonidine (ALPHAGAN) 0.2 % ophthalmic solution Place 1 drop into the left eye 3 (three) times daily.  ? calcitRIOL (ROCALTROL) 0.5 MCG capsule Take 0.5 mcg by mouth daily.  ? carvedilol (COREG) 25 MG tablet Take 1 tablet (25 mg total) by mouth 2 (two) times daily.  ? ferrous sulfate 325 (65 FE) MG tablet Take 325 mg by mouth daily.  ? FLUoxetine (PROZAC) 40 MG capsule Take 40 mg by mouth every morning.  ? glucose blood (FREESTYLE TEST STRIPS) test strip Use as instructed  ? hydrALAZINE (APRESOLINE) 50 MG tablet Take 1 tablet by mouth twice a day, may take 1 extra tablet by mouth daily only as needed for elevated blood pressure (Patient taking differently: Take 50 mg by mouth See admin instructions. Take 50mg  by mouth twice a day, may take an additional 50mg  by mouth daily only as needed for elevated blood pressure.)  ? insulin glargine (LANTUS SOLOSTAR) 100 UNIT/ML Solostar Pen Inject 20 Units into  the skin every morning.  ? insulin lispro (HUMALOG KWIKPEN) 100 UNIT/ML KwikPen 3 times a day (just before each meal) 4-3-3 units, and pen needles 4/day (Patient taking differently: Inject 3-4 Units into the skin See admin instructions. 4 units with breakfast ?3 units with lunch and dinner)  ? metoCLOPramide (REGLAN) 5 MG tablet Take 1 tablet (5 mg total) by mouth 3 (three) times daily before meals.  ? multivitamin (RENA-VIT) TABS tablet Take 1 tablet by mouth at bedtime.  ? ondansetron (ZOFRAN-ODT) 4 MG disintegrating tablet Take 1 tablet (4 mg total) by mouth every 6 (six) hours as needed for nausea or vomiting.  ? promethazine  (PHENERGAN) 12.5 MG tablet Take 1 tablet (12.5 mg total) by mouth every 6 (six) hours as needed for nausea or vomiting.  ? sevelamer carbonate (RENVELA) 800 MG tablet Take 1 tablet (800 mg total) by mouth 3 (three) times daily with meals.  ? ? ? ?Allergies: ?Allergies as of 05/28/2021  ? (No Known Allergies)  ? ?Past Medical History:  ?Diagnosis Date  ? Asthma   ? as a child, no problems as an adult, no inhaler  ? Cataract   ? NS OU  ? Chronic hypertension during pregnancy, antepartum 08/19/2017  ? Dehydration 01/28/2018  ? Depression during pregnancy, antepartum 07/07/2017  ? 6/20: Short trial of zoloft previously, reports didn't help much but also didn't give it a chance Discussed r/b/a SSRIs in pregnancy, agrees to try Zoloft again, rx sent No SI/HI/red flags  ? Diabetes ([place])   ? TYPE I. A1C 7.5% 05/31/20  ? Diabetic retinopathy ([place]) 06/09/2017  ? 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  ? ESRD on peritoneal dialysis [facility])   ? HTN (hypertension)   ? Hypertensive retinopathy   ? OU  ? Hypokalemia 01/22/2018  ? Hypomagnesemia 01/28/2018  ? Intractable nausea and vomiting 01/22/2018  ? Intrauterine growth restriction (IUGR) affecting care of mother 12/22/2017  ? Morbid obesity ([place])   ? 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 Severe hyperemesis gravidarum 10/30/2017  ? Type I diabetes mellitus ([place]) 07/07/2017  ? Current Diabetic Medications:  Insulin  [x]  Aspirin 81 mg daily after 12 weeks (? A2/B GDM)  Required Referrals for A1GDM or A2GDM: [x]  Diabetes Education and Testing Supplies [x]  Nutrition Cousult  For A2/B GDM or higher classes of DM [x]  Diabetes Education and Testing Supplies [x]  Nutrition Counsult [x]  Fetal ECHO after 20 weeks  [x]  Eye exam for retina evaluation - severe retinopathy 7/19  Base  ? Ventricular septal defect (VSD) of fetus in singleton pregnancy, antepartum 09/30/2017  ? May go to newborn nursery per Dr. Lenard Simmer Echo prior to discharge   ? ? ?Family History:  ?Parents: Diabetes ? ?Social History:  ?Lives at home with boyfriend and son.  Independent with ADLs.  Patient does not work. ?Denies alcohol, smoking or drug use. ? ?Review of Systems: ?A complete ROS was negative except as per HPI.  ? ?Physical Exam: ?Blood pressure (!) 141/75, pulse 88, temperature 97.8 ?F (36.6 ?C), temperature source Oral, resp. rate 17, SpO2 100 %. ?Constitutional:Patient is resting comfortably in bed in no acute distress. ?Cardio:Regular rate and rhythm. No murmurs, rubs, gallops. ?Pulm:Clear to auscultation bilaterally. ?Abdomen:Soft, nontender, nondistended. Peritoneal dialysis access site over mid-L abdomen without surrounding warmth, erythema, or edema. ?IRW:[id number] for extremity edema. ?Skin:Skin is warm and dry. ?Neuro:Alert and oriented x3. No focal deficit noted. ?Psych:Normal mood and affect. ? ?EKG: personally reviewed my interpretation is normal sinus rhythm ? ?  CXR: personally reviewed my interpretation is negative for acute abnormality. ? ?CT renal stone study: Marland Kitchen Mild amount of pelvic free fluid, likely secondary to the patient's peritoneal dialysis catheter.; no acute or active process within the abdomen or pelvis. ?  ? ?Assessment & Plan by Problem: ?Principal Problem: ?  Emesis ?Active Problems: ?  Chronic hypertension ?  Type 1 diabetes mellitus with hyperglycemia (HCC) ?  ESRD (end stage renal disease) on PD ? ?Arnetta Odeh is a 31 year old female with past medical history of type 1 diabetes, ESRD on peritoneal dialysis, hypertension, gastroparesis who presents to the ED for nausea and vomiting secondary to gastroparesis. ? ?Emesis ?Gastroparesis ?Over the last several days she reports having nausea with several episodes of emesis, most recently noted to have two episodes without recurrence since earlier in the day on 05/11. Clinically she is not ill appearing and labs to this point are largely unrevealing including lipase and CT abdomen.  Differential includes 2/2 gastroparesis, infectious, pancreatitis, DKA. I feel this is mostly likely secondary to her history of gastroparesis. ?-NS 100 cc/h ?-Reglan 5 mg TID with meals ?-Zofran PRN ?-Clear liquid diet ?-Blood

## 2021-05-29 NOTE — Consult Note (Signed)
[facility] ?Renal Consultation Note  ?  ?Indication for Consultation:  Management of ESRD/hemodialysis, anemia, hypertension/volume, and secondary hyperparathyroidism. ?PCP: ? ?HPI: Barbara Donovan is a 31 y.o. 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 ? ?Presented to ED on 5/11 with 24-hr of severe nausea and vomiting. No CP, dyspnea, or abdominal pain. No fever or chills. No diarrhea. Known gastroparesis, on Reglan TID. She had been seen in ED on 5/1, 5/4, and 5/10 as well for the same issue. Treated each time with combination of IVF bolus, K supp, Reglan, and either benadryl, Zofran, Pepcid, Toradol, or dilaudid. CXR 5/11 negative. Abd CT 5/12 which was negative except for small amount of pelvic free fluid (d/t PD). She was admitted OBS status. ? ?Today, she was seen in room. No nausea/vomiting today. No dyspnea or CP. Was able to eat a little this AM, having normal BM. ? ?On CCPD nightly for her ESRD. Last treatment was 5/10 - not done last night as she was in the ED. Denies recent issues with PD, no abd pain, exit site drainage. No cloudiness to her PD effluent that she has noticed. ? ?Past Medical History:  ?Diagnosis Date  ? Asthma   ? as a child, no problems as an adult, no inhaler  ? Cataract   ? NS OU  ? Chronic hypertension during pregnancy, antepartum 08/19/2017  ? Dehydration 01/28/2018  ? Depression during pregnancy, antepartum 07/07/2017  ? 6/20: Short trial of zoloft previously, reports didn't help much but also didn't give it a chance Discussed r/b/a SSRIs in pregnancy, agrees to try Zoloft again, rx sent No SI/HI/red flags  ? Diabetes ([place])   ? TYPE I. A1C 7.5% 05/31/20  ? Diabetic retinopathy ([place]) 06/09/2017  ? 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  ? ESRD on peritoneal dialysis [facility])   ? HTN (hypertension)   ? Hypertensive retinopathy   ? OU  ? Hypokalemia  01/22/2018  ? Hypomagnesemia 01/28/2018  ? Intractable nausea and vomiting 01/22/2018  ? Intrauterine growth restriction (IUGR) affecting care of mother 12/22/2017  ? Morbid obesity ([place])   ? 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 Severe hyperemesis gravidarum 10/30/2017  ? Type I diabetes mellitus ([place]) 07/07/2017  ? Current Diabetic Medications:  Insulin  [x]  Aspirin 81 mg daily after 12 weeks (? A2/B GDM)  Required Referrals for A1GDM or A2GDM: [x]  Diabetes Education and Testing Supplies [x]  Nutrition Cousult  For A2/B GDM or higher classes of DM [x]  Diabetes Education and Testing Supplies [x]  Nutrition Counsult [x]  Fetal ECHO after 20 weeks  [x]  Eye exam for retina evaluation - severe retinopathy 7/19  Base  ? Ventricular septal defect (VSD) of fetus in singleton pregnancy, antepartum 09/30/2017  ? May go to newborn nursery per Dr. Lenard Simmer Echo prior to discharge  ? ?Past Surgical History:  ?Procedure Laterality Date  ? 25 GAUGE PARS PLANA VITRECTOMY WITH 20 GAUGE MVR PORT FOR MACULAR HOLE Left 07/20/2018  ? Procedure: [place] VITRECTOMY LEFT EYE;  Surgeon: Bernarda Caffey, MD;  Location: [place];  Service: Ophthalmology;  Laterality: Left;  ? CAPD INSERTION N/A 09/10/2020  ? Procedure: LAPAROSCOPIC INSERTION CONTINUOUS AMBULATORY PERITONEAL DIALYSIS  (CAPD) CATHETER;  Surgeon: Serafina Mitchell, MD;  Location: [place];  Service: Vascular;  Laterality: N/A;  ? CESAREAN SECTION N/A 12/26/2017  ? Procedure: CESAREAN SECTION;  Surgeon: Osborne Oman, MD;  Location: [place];  Service: Obstetrics;  Laterality: N/A;  ? EYE EXAMINATION UNDER ANESTHESIA Right 07/20/2018  ? Procedure: Eye Exam Under Anesthesia RIGHT EYE;  Surgeon: Bernarda Caffey, MD;  Location: [place];  Service: Ophthalmology;  Laterality: Right;  ? EYE SURGERY Left 07/2018  ? GAS INSERTION Left 07/19/2019  ? Procedure: INSERTION OF GAS;  Surgeon: Bernarda Caffey, MD;  Location: [place];  Service: Ophthalmology;  Laterality: Left;  SF6  ? INJECTION  OF SILICONE OIL Left 04/21/9673  ? Procedure: Injection Of Silicone Oil LEFT EYE;  Surgeon: Bernarda Caffey, MD;  Location: [place];  Service: Ophthalmology;  Laterality: Left;  ? IR FLUORO GUIDE CV LINE RIGHT  06/02/2020  ? IR US GUIDE VASC ACCESS RIGHT  06/02/2020  ? LASER PHOTO ABLATION Right 07/20/2018  ? Procedure: Laser Photo Ablation RIGHT EYE;  Surgeon: Bernarda Caffey, MD;  Location: [place];  Service: Ophthalmology;  Laterality: Right;  ? MEMBRANE PEEL Left 07/20/2018  ? Procedure: Membrane Peel LEFT EYE;  Surgeon: Bernarda Caffey, MD;  Location: [place];  Service: Ophthalmology;  Laterality: Left;  ? MEMBRANE PEEL Left 07/19/2019  ? Procedure: MEMBRANE PEEL;  Surgeon: Bernarda Caffey, MD;  Location: [place];  Service: Ophthalmology;  Laterality: Left;  ? MITOMYCIN C APPLICATION Bilateral 09/19/6382  ? Procedure: Avastin Application;  Surgeon: Bernarda Caffey, MD;  Location: [place];  Service: Ophthalmology;  Laterality: Bilateral;  ? PHOTOCOAGULATION WITH LASER Left 07/20/2018  ? Procedure: Photocoagulation With Laser LEFT EYE;  Surgeon: Bernarda Caffey, MD;  Location: [place];  Service: Ophthalmology;  Laterality: Left;  ? PHOTOCOAGULATION WITH LASER Left 07/19/2019  ? Procedure: PHOTOCOAGULATION WITH LASER;  Surgeon: Bernarda Caffey, MD;  Location: [place];  Service: Ophthalmology;  Laterality: Left;  ? RETINAL DETACHMENT SURGERY Left 07/20/2018  ? Dr. Bernarda Caffey  ? SILICON OIL REMOVAL Left 07/19/2019  ? Procedure: 25g PARS PLANA VITRECTOMY WITH SILICON OIL REMOVAL;  Surgeon: Bernarda Caffey, MD;  Location: [place];  Service: Ophthalmology;  Laterality: Left;  ? WISDOM TOOTH EXTRACTION    ? ?Family History  ?Problem Relation Age of Onset  ? Diabetes Mother   ? Aneurysm Mother 29  ? Seizures Mother   ? Diabetes Father   ? Cataracts Father   ? COPD Father   ? Heart attack Father   ? Heart disease Father   ? Healthy Sister   ? Healthy Daughter   ? Stroke Maternal Grandfather   ? Amblyopia Neg Hx   ? Blindness Neg Hx   ? Glaucoma Neg Hx   ? Macular  degeneration Neg Hx   ? Retinal detachment Neg Hx   ? Strabismus Neg Hx   ? Retinitis pigmentosa Neg Hx   ? Colon cancer Neg Hx   ? Stomach cancer Neg Hx   ? Esophageal cancer Neg Hx   ? Pancreatic cancer Neg Hx   ? Liver disease Neg Hx   ? ?Social History: ? reports that she has never smoked. She has never used smokeless tobacco. She reports that she does not currently use alcohol. She reports that she does not use drugs. ? ?ROS: As per HPI otherwise negative. ? ?Vitals:  ? 05/29/21 0328 05/29/21 0543 05/29/21 0645 05/29/21 6659  ?BP: (!) 141/75 (!) 159/104 (!) 160/99 (!) 143/96  ?Pulse: 88 89 79 88  ?Resp: 17 17 18 18   ?Temp: 97.8 ?F (36.6 ?C) 98.2 ?F (36.8 ?C) 98 ?F (36.7 ?C) 98.7 ?F (37.1 ?C)  ?TempSrc: Oral Oral  Oral  ?SpO2: 100% 99% 98% 99%  ?   ?  General: Well developed, well nourished, in no acute distress. Room air. ?Head: Normocephalic, atraumatic, sclera non-icteric, mucus membranes are moist. ?Neck: Supple without lymphadenopathy/masses. JVD not elevated. ?Lungs: Clear bilaterally to auscultation without wheezes, rales, or rhonchi. Breathing is unlabored. ?Heart: RRR with normal S1, S2. No murmurs, rubs, or gallops appreciated. ?Abdomen: Soft, non-tender, non-distended with normoactive bowel sounds. PD cath without bandage in mid L abdomen; no exit site erythema or drainage. ?Musculoskeletal:  Strength and tone appear normal for age. ?Lower extremities: No edema or ischemic changes, no open wounds. ?Neuro: Alert and oriented X 3. Moves all extremities spontaneously. ?Psych:  Responds to questions appropriately with a normal affect. ?Dialysis Access: PD cath, mid L abdomen. ? ?No Known Allergies ?Prior to Admission medications   ?Medication Sig Start Date End Date Taking? Authorizing Provider  ?amLODipine (NORVASC) 10 MG tablet Take 1 tablet (10 mg total) by mouth daily. ?Patient taking differently: Take 10 mg by mouth every evening. 03/09/20  Yes Gonfa, Taye T, MD  ?brimonidine (ALPHAGAN) 0.2 % ophthalmic  solution Place 1 drop into the left eye 3 (three) times daily. 03/09/21  Yes [provider]  ?calcitRIOL (ROCALTROL) 0.5 MCG capsule Take 0.5 mcg by mouth daily. 12/02/20  Yes [provider]

## 2021-05-29 NOTE — TOC Progression Note (Signed)
Transition of Care (TOC) - Initial/Assessment Note  ? ? ?Patient Details  ?Name: Barbara Donovan ?MRN: [id number] ?Date of Birth: August 28, 1990 ? ?Transition of Care (TOC) CM/SW Contact:    ?Paulene Floor Sama Arauz, LCSWA ?Phone Number: ?05/29/2021, 1:44 PM ? ?Clinical Narrative:                 ?TOC following patient for any d/c planning needs once medically stable. ? ? ?Lind Covert, MSW, LCSWA  ? ?  ?  ? ? ?Patient Goals and CMS Choice ?  ?  ?  ? ?Expected Discharge Plan and Services ?  ?  ?  ?  ?  ?                ?  ?  ?  ?  ?  ?  ?  ?  ?  ?  ? ?Prior Living Arrangements/Services ?  ?  ?  ?       ?  ?  ?  ?  ? ?Activities of Daily Living ?Home Assistive Devices/Equipment: CBG Meter ?ADL Screening (condition at time of admission) ?Patient's cognitive ability adequate to safely complete daily activities?: Yes ?Is the patient deaf or have difficulty hearing?: No ?Does the patient have difficulty seeing, even when wearing glasses/contacts?: No ?Does the patient have difficulty concentrating, remembering, or making decisions?: No ?Patient able to express need for assistance with ADLs?: Yes ?Does the patient have difficulty dressing or bathing?: No ?Independently performs ADLs?: Yes (appropriate for developmental age) ?Does the patient have difficulty walking or climbing stairs?: No ?Weakness of Legs: None ?Weakness of Arms/Hands: None ? ?Permission Sought/Granted ?  ?  ?   ?   ?   ?   ? ?Emotional Assessment ?  ?  ?  ?  ?  ?  ? ?Admission diagnosis:  Gastroparesis [K31.84] ?Emesis [R11.10] ?Intractable vomiting [R11.10] ?Patient Active Problem List  ? Diagnosis Date Noted  ? Emesis 05/29/2021  ? DKA (diabetic ketoacidosis) ([place]) 04/23/2021  ? Nausea and vomiting 04/22/2021  ? Hyperlipidemia 04/20/2021  ? Gastroparesis   ? Anxiety and depression 02/04/2021  ? Type 1 diabetes mellitus with hyperglycemia ([place]) 11/10/2020  ? ESRD (end stage renal disease) on PD 11/10/2020  ? Anemia due to stage 5 chronic kidney disease ([place])  03/04/2020  ? Intractable nausea and vomiting 01/22/2018  ? Chronic hypertension 08/21/2017  ? Diabetic retinopathy ([place]) 06/09/2017  ? Asthma 10/30/2013  ? ?PCP:  Timothy Lasso, MD ?Pharmacy:   ?[id number] - [place], [place] [address] ?[place] [address] ?Phone: [phone] Fax: [phone] ? ?[facility] ?[address] ?[place] [address] ?Phone: [phone] Fax: [phone] ? ? ? ? ?Social Determinants of Health (SDOH) Interventions ?  ? ?Readmission Risk Interventions ? ?  06/09/2020  ?  1:04 PM  ?Readmission Risk Prevention Plan  ?Transportation Screening Complete  ?PCP or Specialist appointment within 3-5 days of discharge Complete  ?[place] or Home Care Consult Complete  ?SW Recovery Care/Counseling Consult Complete  ?Palliative Care Screening Not Applicable  ?[place] Not Applicable  ? ? ? ?

## 2021-05-29 NOTE — Progress Notes (Signed)
Inpatient Diabetes Program Recommendations ? ?AACE/ADA: New Consensus Statement on Inpatient Glycemic Control (2015) ? ?Target Ranges:  Prepandial:   less than 140 mg/dL ?     Peak postprandial:   less than 180 mg/dL (1-2 hours) ?     Critically ill patients:  140 - 180 mg/dL  ? ? Latest Reference Range & Units 05/29/21 03:56  ?Glucose 70 - 99 mg/dL 165 (H)  ?(H): Data is abnormally high ? ? ? ?Admit with:  ?Emesis ?Gastroparesis ? ?History: Type 1 Diabetes, ESRD (PD at home) ? ?Home DM Meds: Lantus 20 units daily ?        Humalog 4 units TID with meals ? ?Current Orders: Semglee 20 units Daily ? ? ? ?MD- Note orders to start Semglee 20 units Daily at 3am today.  Dose NOT yet given. ? ?Please make sure pt receives Basal Insulin today ? ?Also, please consider placing orders for CBG checks and Novolog Sensitive Correction Scale/ SSI (0-9 units) Q4 hours  ? ? ? ?--Will follow patient during hospitalization-- ? ?Wyn Quaker RN, MSN, CDE ?Diabetes Coordinator ?Inpatient Glycemic Control Team ?Team Pager: [phone] (8a-5p) ? ?

## 2021-05-29 NOTE — Progress Notes (Signed)
Initial Nutrition Assessment ? ?DOCUMENTATION CODES:  ? ?Not applicable ? ?INTERVENTION:  ? ?Liberalize diet to SOFT given pt electrolytes appropriate, recent poor po intake, pt on PD which allows for more liberal diet, +gastroparesis flare ? ?Provided patient with "Gastroparesis Nutrition Therapy" handout and reviewed nutrition recommendations. Encouraged pt to follow-up with Outpatient RD (pt see outpatient RD at clinic once a month) ? ?Continue Renal MVI ? ?Nepro Shake po BID, each supplement provides 425 kcal and 19 grams protein ? ? ? ?NUTRITION DIAGNOSIS:  ? ?Inadequate oral intake related to nausea, vomiting as evidenced by per patient/family report. ? ?GOAL:  ? ?Patient will meet greater than or equal to 90% of their needs ? ?MONITOR:  ? ?PO intake, Supplement acceptance, Weight trends, Labs ? ?REASON FOR ASSESSMENT:  ? ?Malnutrition Screening Tool ?  ? ?ASSESSMENT:  ? ?31 yo female admitted with emesis secondary to gastroparesis. PMH includes ESRD on PD, HTN, DM ? ?Noted possible discharge in next day or so.  ? ?Pt on CCPD, 4 exchanges, 1.5%, 2L fill volume. Provides small amount of calories.  ? ?Pt reports vomiting for several days prior to admission but prior to this eating well.  ? ?On visit today, pt tolerated CL breakfast tray and was tolerating FL lunch tray. Pt indicating she is hungry. This afternoon diet advanced to Renal Carb Modified ? ?Pt reports hx of gastroparesis and indicates she has been on Reglan for a while. Pt reports she feels the worst if she skips a meal. Encouraged small, frequent meals. Provided written and verbal education on Gastroparesis nutrition therapy.  ? ?Pt reports she takes a renal MVI daily; does not take any protein supplements at home.Pt reports she takes her Renvela as prescribed.  ? ?Potassium is low requiring supplementation ? ?Pt reports her weight has been stable; reports her dry weight is 135 pounds. No current wt from this admission. Recommend new weight ? ?Labs:  potassium 3.3 (L), no phosphorus, CBGs 119-229  ?Meds: calcitriol, ss novolog, reglan, Rena-vite, KCl, Renvela TID, semglee ? ? ?NUTRITION - FOCUSED PHYSICAL EXAM: ? ?Deferred ? ?Diet Order:   ?Diet Order   ? ?       ?  DIET SOFT Room service appropriate? Yes; Fluid consistency: Thin  Diet effective now       ?  ? ?  ?  ? ?  ? ? ?EDUCATION NEEDS:  ? ?Education needs have been addressed ? ?Skin:  Skin Assessment: Reviewed RN Assessment ? ?Last BM:  PTA ? ?Height:  ? ?Ht Readings from Last 1 Encounters:  ?05/21/21 5\' 6"  (1.676 m)  ? ? ?Weight:  ? ?Wt Readings from Last 1 Encounters:  ?05/21/21 61 kg  ? ? ?BMI:  There is no height or weight on file to calculate BMI. ? ?Estimated Nutritional Needs:  ? ?Kcal:  1900-2100 kcals ? ?Protein:  90-105 g ? ?Fluid:  2L plus UOP ? ? ? ?Kerman Passey MS, RDN, LDN, CNSC ?Registered Dietitian III ?Clinical Nutrition ?RD Pager and On-Call Pager Number Located in [place]  ? ?

## 2021-05-30 DIAGNOSIS — K3184 Gastroparesis: Secondary | ICD-10-CM | POA: Diagnosis not present

## 2021-05-30 DIAGNOSIS — R112 Nausea with vomiting, unspecified: Secondary | ICD-10-CM | POA: Diagnosis not present

## 2021-05-30 LAB — CBC
HCT: 29.1 % — ABNORMAL LOW (ref 36.0–46.0)
Hemoglobin: 10 g/dL — ABNORMAL LOW (ref 12.0–15.0)
MCH: 29.2 pg (ref 26.0–34.0)
MCHC: 34.4 g/dL (ref 30.0–36.0)
MCV: 85.1 fL (ref 80.0–100.0)
Platelets: 353 10*3/uL (ref 150–400)
RBC: 3.42 MIL/uL — ABNORMAL LOW (ref 3.87–5.11)
RDW: 13.2 % (ref 11.5–15.5)
WBC: 7.1 10*3/uL (ref 4.0–10.5)
nRBC: 0 % (ref 0.0–0.2)

## 2021-05-30 LAB — RENAL FUNCTION PANEL
Albumin: 2.8 g/dL — ABNORMAL LOW (ref 3.5–5.0)
Anion gap: 10 (ref 5–15)
BUN: 55 mg/dL — ABNORMAL HIGH (ref 6–20)
CO2: 22 mmol/L (ref 22–32)
Calcium: 9.2 mg/dL (ref 8.9–10.3)
Chloride: 104 mmol/L (ref 98–111)
Creatinine, Ser: 9.18 mg/dL — ABNORMAL HIGH (ref 0.44–1.00)
GFR, Estimated: 5 mL/min — ABNORMAL LOW (ref >60)
Glucose, Bld: 117 mg/dL — ABNORMAL HIGH (ref 70–99)
Phosphorus: 6.4 mg/dL — ABNORMAL HIGH (ref 2.5–4.6)
Potassium: 3.5 mmol/L (ref 3.5–5.1)
Sodium: 136 mmol/L (ref 135–145)

## 2021-05-30 LAB — GLUCOSE, CAPILLARY
Glucose-Capillary: 110 mg/dL — ABNORMAL HIGH (ref 70–99)
Glucose-Capillary: 174 mg/dL — ABNORMAL HIGH (ref 70–99)

## 2021-05-30 NOTE — Progress Notes (Signed)
?[facility] ?Progress Note  ? ?Subjective:  Seen in room. No issues with HD overnight - 649mL UF. BP controlled. No dyspnea/CP. No abdominal pain or vomiting this AM. Able to keep breakfast and keep it down. Cell count from PD effluent was 0 - definitely no peritonitis. ? ?Objective ?Vitals:  ? 05/29/21 2050 05/30/21 0200 05/30/21 0507 05/30/21 0828  ?BP: 124/84  132/89 138/86  ?Pulse: 84  80 86  ?Resp: 18  18 16   ?Temp: 98.5 ?F (36.9 ?C)  98.8 ?F (37.1 ?C) 98.6 ?F (37 ?C)  ?TempSrc:      ?SpO2: 100%  99% 100%  ?Weight:  65.2 kg    ?Height:  5\' 6"  (1.676 m)    ? ?Physical Exam ?General: Well appearing, NAD. Room air. ?Heart: RRR; no murmur ?Lungs: CTAB; no r ales ?Abdomen: soft, non-tender ?Extremities: No LE edema ?Dialysis Access: PD cath in mid L abdomen with bandage in place ? ?Additional Objective ?Labs: ?Basic Metabolic Panel: ?Recent Labs  ?Lab 05/28/21 ?1619 05/29/21 ?0356 05/30/21 ?0703  ?NA 134* 135 136  ?K 3.8 3.3* 3.5  ?CL 100 104 104  ?CO2 17* 18* 22  ?GLUCOSE 182* 165* 117*  ?BUN 53* 57* 55*  ?CREATININE 9.67* 10.10* 9.18*  ?CALCIUM 10.3 9.5 9.2  ?PHOS  --   --  6.4*  ? ?Liver Function Tests: ?Recent Labs  ?Lab 05/27/21 ?1538 05/28/21 ?1619 05/29/21 ?6144 05/30/21 ?0703  ?AST 16 22 17   --   ?ALT 16 17 14   --   ?ALKPHOS 46 48 44  --   ?BILITOT 0.6 0.8 0.7  --   ?PROT 7.1 7.3 6.0*  --   ?ALBUMIN 3.4* 3.6 3.0* 2.8*  ? ?Recent Labs  ?Lab 05/27/21 ?1538 05/28/21 ?1619  ?LIPASE 59* 70*  ? ?CBC: ?Recent Labs  ?Lab 05/27/21 ?1538 05/28/21 ?1619 05/29/21 ?3154 05/30/21 ?0703  ?WBC 10.7* 9.7 8.2 7.1  ?NEUTROABS 8.3* 7.2  --   --   ?HGB 11.9* 12.2 9.4* 10.0*  ?HCT 35.8* 38.1 28.4* 29.1*  ?MCV 86.3 87.0 85.8 85.1  ?PLT 470* 500* 375 353  ? ?Blood Culture ?   ?Component Value Date/Time  ? SDES PERITONEAL DIALYSATE 05/29/2021 2146  ? [place] NONE 05/29/2021 2146  ? CULT PENDING 05/29/2021 2146  ? REPTSTATUS PENDING 05/29/2021 2146  ? ?Studies/Results: ?DG Chest 1 View ? ?Result Date:  05/28/2021 ?CLINICAL DATA:  Fever with nausea and vomiting. EXAM: CHEST  1 VIEW COMPARISON:  05/18/2021 FINDINGS: The heart is normal in sizethe cardiomediastinal contours are normal. The lungs are clear. Pulmonary vasculature is normal. No consolidation, pleural effusion, or pneumothorax. No acute osseous abnormalities are seen. IMPRESSION: Negative frontal view of the chest. Electronically Signed   By: Keith Rake M.D.   On: 05/28/2021 23:48  ? ?CT Renal Stone Study ? ?Result Date: 05/29/2021 ?CLINICAL DATA:  Nausea and vomiting x1 week. EXAM: CT ABDOMEN AND PELVIS WITHOUT CONTRAST TECHNIQUE: Multidetector CT imaging of the abdomen and pelvis was performed following the standard protocol without IV contrast. RADIATION DOSE REDUCTION: This exam was performed according to the departmental dose-optimization program which includes automated exposure control, adjustment of the mA and/or kV according to patient size and/or use of iterative reconstruction technique. COMPARISON:  None Available. FINDINGS: Lower chest: Very mild atelectasis is seen along the posterior aspect of the bilateral lung bases. Hepatobiliary: No focal liver abnormality is seen. No gallstones, gallbladder wall thickening, or biliary dilatation. Pancreas: Unremarkable. No pancreatic ductal dilatation or surrounding inflammatory changes. Spleen: Normal  in size without focal abnormality. Adrenals/Urinary Tract: Adrenal glands are unremarkable. Kidneys are normal, without renal calculi, focal lesion, or hydronephrosis. The urinary bladder is moderately distended and otherwise unremarkable. Stomach/Bowel: Stomach is within normal limits. Appendix appears normal. No evidence of bowel wall thickening, distention, or inflammatory changes. Vascular/Lymphatic: No significant vascular findings are present. No enlarged abdominal or pelvic lymph nodes. Reproductive: Uterus and bilateral adnexa are unremarkable. Other: No abdominal wall hernia or abnormality.  A peritoneal dialysis catheter is seen with its distal end looped within the posterior aspect of the pelvis. There is a mild amount of pelvic free fluid. Musculoskeletal: No acute or significant osseous findings. IMPRESSION: 1. Mild amount of pelvic free fluid, likely secondary to the patient's peritoneal dialysis catheter. 2. No acute or active process within the abdomen or pelvis. Electronically Signed   By: Virgina Norfolk M.D.   On: 05/29/2021 01:28   ? ?Medications: ? ? amLODipine  10 mg Oral Daily  ? calcitRIOL  0.5 mcg Oral Daily  ? carvedilol  25 mg Oral BID WC  ? feeding supplement (NEPRO CARB STEADY)  237 mL Oral BID BM  ? FLUoxetine  20 mg Oral Daily  ? gentamicin cream  1 application. Topical Daily  ? dianeal solution for CAPD/CCPD with heparin   Peritoneal Dialysis Q24H  ? heparin  5,000 Units Subcutaneous Q8H  ? hydrALAZINE  50 mg Oral BID  ? insulin aspart  0-15 Units Subcutaneous TID WC  ? insulin aspart  0-5 Units Subcutaneous QHS  ? insulin glargine-yfgn  20 Units Subcutaneous Daily  ? metoCLOPramide  5 mg Oral TID AC  ? multivitamin  1 tablet Oral QHS  ? potassium chloride  40 mEq Oral Once  ? sevelamer carbonate  800 mg Oral TID WC  ? ? ?Dialysis Orders: ?CCPD: 4 exchanges, 2L fill volume. No day dwell, uses 1.5% for most part. ?- Adds heparin 3000units to her PD bags. ?- Looks like PD Rx reduced from 5 exchanges to 4/night due to ^ clearance on 05/11/21. ?Home BMM meds: Calcitriol 0.83mcg QD, Renvela 800mg  TID ?  ?Assessment/Plan: ? Intractable nausea/vomiting: C/w gastroparesis likely, improved today. WBC normalized, PD fluid cell count 0. Needs GI follow-up as outpatient. ?ESRD on CCPD: Continue usual CCPD orders while here. 4 exchanges, 2L, all 1.5%. ?Hypokalemia (mild): On admit, improved with daily K 61mEq. ? Hypertension/volume: BP controlled, keeping meds down. No edema. ? Anemia: Hgb 10 - follow. ? Metabolic bone disease: CorrCa high on admit, improving now. Continue VDRA for now and  continue Renvela as binder. ? Nutrition:  Alb low, continue supplements. ?T1DM: Insulin per primary. ? ?  ? ?Veneta Penton, PA-C ?05/30/2021, 9:53 AM  ?[facility] ? ? ? ?

## 2021-05-30 NOTE — TOC Transition Note (Signed)
Transition of Care (TOC) - CM/SW Discharge Note ? ? ?Patient Details  ?Name: Barbara Donovan ?MRN: [id number] ?Date of Birth: 07-30-1990 ? ?Transition of Care (TOC) CM/SW Contact:  ?Bartholomew Crews, RN ?Phone Number: [phone] ?05/30/2021, 11:32 AM ? ? ?Clinical Narrative:    ? ?Patient to transition home today. No TOC needs identified at this time.  ? ?Final next level of care: Home/Self Care ?Barriers to Discharge: No Barriers Identified ? ? ?Patient Goals and CMS Choice ?  ?  ?  ? ?Discharge Placement ?  ?           ?  ?  ?  ?  ? ?Discharge Plan and Services ?  ?  ?           ?  ?  ?  ?  ?  ?  ?  ?  ?  ?  ? ?Social Determinants of Health (SDOH) Interventions ?  ? ? ?Readmission Risk Interventions ? ?  06/09/2020  ?  1:04 PM  ?Readmission Risk Prevention Plan  ?Transportation Screening Complete  ?PCP or Specialist appointment within 3-5 days of discharge Complete  ?[place] or Home Care Consult Complete  ?SW Recovery Care/Counseling Consult Complete  ?Palliative Care Screening Not Applicable  ?[place] Not Applicable  ? ? ? ? ? ?

## 2021-05-30 NOTE — Progress Notes (Signed)
DISCHARGE NOTE HOME ?[name] to be discharged Home per MD order. Discussed prescriptions and follow up appointments with the patient. Prescriptions given to patient; medication list explained in detail. Patient verbalized understanding. ? ?Skin clean, dry and intact without evidence of skin break down, no evidence of skin tears noted. IV catheter discontinued intact. Site without signs and symptoms of complications. Dressing and pressure applied. Pt denies pain at the site currently. No complaints noted. ? ?Patient free of lines, drains, and wounds.  ? ?An After Visit Summary (AVS) was printed and given to the patient. ?Patient escorted via wheelchair, and discharged home via private auto. ? ?Arlyss Repress, RN  ?

## 2021-05-30 NOTE — Plan of Care (Signed)
?  Problem: Clinical Measurements: ?Goal: Will remain free from infection ?Outcome: Completed/Met ?Goal: Diagnostic test results will improve ?Outcome: Completed/Met ?Goal: Respiratory complications will improve ?Outcome: Completed/Met ?  ?Problem: Activity: ?Goal: Risk for activity intolerance will decrease ?Outcome: Completed/Met ?  ?Problem: Coping: ?Goal: Level of anxiety will decrease ?Outcome: Completed/Met ?  ?Problem: Elimination: ?Goal: Will not experience complications related to bowel motility ?Outcome: Completed/Met ?Goal: Will not experience complications related to urinary retention ?Outcome: Completed/Met ?  ?Problem: Pain Managment: ?Goal: General experience of comfort will improve ?Outcome: Completed/Met ?  ?Problem: Safety: ?Goal: Ability to remain free from injury will improve ?Outcome: Completed/Met ?  ?Problem: Skin Integrity: ?Goal: Risk for impaired skin integrity will decrease ?Outcome: Completed/Met ?  ?Problem: Education: ?Goal: Knowledge of disease and its progression will improve ?Outcome: Completed/Met ?  ?

## 2021-05-30 NOTE — Discharge Instructions (Signed)
[name],  ?It was a pleasure taking care of you at [facility] were admitted for nausea/vomiting and treated for an acute flare of your gastroparesis. We are discharging you home now that you are doing better. Please follow the following instructions.  ?1) Continue taking all your medications as prescribed, especially your Reglan ?2) Follow-up with internal medicine clinic for hospital follow-up in 1 week. ? ?Take care,  ?Dr. Linwood Dibbles, MD, MPH ? ?

## 2021-06-01 ENCOUNTER — Emergency Department (HOSPITAL_COMMUNITY)
Admission: EM | Admit: 2021-06-01 | Discharge: 2021-06-02 | Disposition: A | Discharge status: Home / Self Care | Payer: 59 | Attending: Emergency Medicine | Admitting: Emergency Medicine

## 2021-06-01 ENCOUNTER — Encounter (HOSPITAL_COMMUNITY): Payer: Self-pay | Admitting: Emergency Medicine

## 2021-06-01 ENCOUNTER — Other Ambulatory Visit: Payer: Self-pay

## 2021-06-01 DIAGNOSIS — Z79899 Other long term (current) drug therapy: Secondary | ICD-10-CM | POA: Insufficient documentation

## 2021-06-01 DIAGNOSIS — E1021 Type 1 diabetes mellitus with diabetic nephropathy: Secondary | ICD-10-CM | POA: Insufficient documentation

## 2021-06-01 DIAGNOSIS — E1022 Type 1 diabetes mellitus with diabetic chronic kidney disease: Secondary | ICD-10-CM | POA: Diagnosis not present

## 2021-06-01 DIAGNOSIS — Z992 Dependence on renal dialysis: Secondary | ICD-10-CM | POA: Insufficient documentation

## 2021-06-01 DIAGNOSIS — Z794 Long term (current) use of insulin: Secondary | ICD-10-CM | POA: Diagnosis not present

## 2021-06-01 DIAGNOSIS — J45909 Unspecified asthma, uncomplicated: Secondary | ICD-10-CM | POA: Diagnosis not present

## 2021-06-01 DIAGNOSIS — I120 Hypertensive chronic kidney disease with stage 5 chronic kidney disease or end stage renal disease: Secondary | ICD-10-CM | POA: Diagnosis not present

## 2021-06-01 DIAGNOSIS — K3184 Gastroparesis: Secondary | ICD-10-CM | POA: Insufficient documentation

## 2021-06-01 DIAGNOSIS — N186 End stage renal disease: Secondary | ICD-10-CM | POA: Diagnosis not present

## 2021-06-01 DIAGNOSIS — R112 Nausea with vomiting, unspecified: Secondary | ICD-10-CM | POA: Diagnosis present

## 2021-06-01 DIAGNOSIS — E1065 Type 1 diabetes mellitus with hyperglycemia: Secondary | ICD-10-CM | POA: Insufficient documentation

## 2021-06-01 DIAGNOSIS — R000 Tachycardia, unspecified: Secondary | ICD-10-CM | POA: Diagnosis not present

## 2021-06-01 DIAGNOSIS — E871 Hypo-osmolality and hyponatremia: Secondary | ICD-10-CM | POA: Diagnosis not present

## 2021-06-01 LAB — POCT I-STAT EG7
Acid-base deficit: 3 mmol/L — ABNORMAL HIGH (ref 0.0–2.0)
Bicarbonate: 20.3 mmol/L (ref 20.0–28.0)
Calcium, Ion: 1.18 mmol/L (ref 1.15–1.40)
HCT: 28 % — ABNORMAL LOW (ref 36.0–46.0)
Hemoglobin: 9.5 g/dL — ABNORMAL LOW (ref 12.0–15.0)
O2 Saturation: 96 %
Potassium: 3.3 mmol/L — ABNORMAL LOW (ref 3.5–5.1)
Sodium: 136 mmol/L (ref 135–145)
TCO2: 21 mmol/L — ABNORMAL LOW (ref 22–32)
pCO2, Ven: 28.5 mmHg — ABNORMAL LOW (ref 44–60)
pH, Ven: 7.461 — ABNORMAL HIGH (ref 7.25–7.43)
pO2, Ven: 73 mmHg — ABNORMAL HIGH (ref 32–45)

## 2021-06-01 LAB — CBC WITH DIFFERENTIAL/PLATELET
Abs Immature Granulocytes: 0.07 10*3/uL (ref 0.00–0.07)
Basophils Absolute: 0 10*3/uL (ref 0.0–0.1)
Basophils Relative: 0 %
Eosinophils Absolute: 0.1 10*3/uL (ref 0.0–0.5)
Eosinophils Relative: 1 %
HCT: 33.4 % — ABNORMAL LOW (ref 36.0–46.0)
Hemoglobin: 11.2 g/dL — ABNORMAL LOW (ref 12.0–15.0)
Immature Granulocytes: 1 %
Lymphocytes Relative: 17 %
Lymphs Abs: 1.8 10*3/uL (ref 0.7–4.0)
MCH: 28.1 pg (ref 26.0–34.0)
MCHC: 33.5 g/dL (ref 30.0–36.0)
MCV: 83.7 fL (ref 80.0–100.0)
Monocytes Absolute: 0.5 10*3/uL (ref 0.1–1.0)
Monocytes Relative: 5 %
Neutro Abs: 8.2 10*3/uL — ABNORMAL HIGH (ref 1.7–7.7)
Neutrophils Relative %: 76 %
Platelets: 438 10*3/uL — ABNORMAL HIGH (ref 150–400)
RBC: 3.99 MIL/uL (ref 3.87–5.11)
RDW: 13.1 % (ref 11.5–15.5)
WBC: 10.8 10*3/uL — ABNORMAL HIGH (ref 4.0–10.5)
nRBC: 0 % (ref 0.0–0.2)

## 2021-06-01 LAB — COMPREHENSIVE METABOLIC PANEL
ALT: 17 U/L (ref 0–44)
AST: 20 U/L (ref 15–41)
Albumin: 3.6 g/dL (ref 3.5–5.0)
Alkaline Phosphatase: 47 U/L (ref 38–126)
Anion gap: 18 — ABNORMAL HIGH (ref 5–15)
BUN: 63 mg/dL — ABNORMAL HIGH (ref 6–20)
CO2: 17 mmol/L — ABNORMAL LOW (ref 22–32)
Calcium: 10.6 mg/dL — ABNORMAL HIGH (ref 8.9–10.3)
Chloride: 98 mmol/L (ref 98–111)
Creatinine, Ser: 9.18 mg/dL — ABNORMAL HIGH (ref 0.44–1.00)
GFR, Estimated: 5 mL/min — ABNORMAL LOW (ref >60)
Glucose, Bld: 135 mg/dL — ABNORMAL HIGH (ref 70–99)
Potassium: 3.4 mmol/L — ABNORMAL LOW (ref 3.5–5.1)
Sodium: 133 mmol/L — ABNORMAL LOW (ref 135–145)
Total Bilirubin: 0.5 mg/dL (ref 0.3–1.2)
Total Protein: 7.1 g/dL (ref 6.5–8.1)

## 2021-06-01 LAB — LIPASE, BLOOD: Lipase: 56 U/L — ABNORMAL HIGH (ref 11–51)

## 2021-06-01 LAB — I-STAT BETA HCG BLOOD, ED (MC, WL, AP ONLY): I-stat hCG, quantitative: <5 m[IU]/mL (ref <5)

## 2021-06-01 MED ORDER — METOCLOPRAMIDE HCL 5 MG/ML IJ SOLN
5.0000 mg | Freq: Once | INTRAMUSCULAR | Status: AC
  Administered 2021-06-01: 5 mg via INTRAVENOUS
  Filled 2021-06-01: qty 2

## 2021-06-01 NOTE — ED Triage Notes (Signed)
Pt brough to ED by [facility] with c/o emesis. Pt is a peritoneal dialysis pt and last treatment was last night. Zofran 4mg  IV given by EMS.  Pt has 20G LAC inserted by EMS. ? ?EMS Vitals ?BP 184/107 ?HR 100 ?SPO2 100 ?CBG 140 ?

## 2021-06-01 NOTE — ED Provider Triage Note (Signed)
Emergency Medicine Provider Triage Evaluation Note ? ?Barbara Donovan , a 31 y.o. female  was evaluated in triage.  Pt complains of emesis.  Is a peritoneal dialysis patient completed this last night.  Minimal improvement noted with home Reglan.  Denies any abdominal pain or changes to bowel movement. ? ?Review of Systems  ?Positive: Vomiting ?Negative: Abdominal pain ? ?Physical Exam  ?BP (!) 170/98   Pulse 95   Temp 97.9 ?F (36.6 ?C) (Oral)   Resp 20   SpO2 100%  ?Gen:   Awake, no distress   ?Resp:  Normal effort  ?MSK:   Moves extremities without difficulty  ?Other:  Vomiting and heaving on exam ? ?Medical Decision Making  ?Medically screening exam initiated at 9:06 PM.  Appropriate orders placed.  Barbara Donovan was informed that the remainder of the evaluation will be completed by another provider, this initial triage assessment does not replace that evaluation, and the importance of remaining in the ED until their evaluation is complete. ? ?Labs ordered ?  ?Delia Heady, PA-C ?06/01/21 2106 ? ?

## 2021-06-01 NOTE — Progress Notes (Signed)
Late entry Note: ? ?Pt was d/c to home over the weekend. Contacted [facility] and spoke to [place], Therapist, sports. Clinic advised of pt's d/c on Saturday. ? ?Melven Sartorius ?Renal Navigator ?[phone] ?

## 2021-06-02 DIAGNOSIS — K3184 Gastroparesis: Secondary | ICD-10-CM | POA: Diagnosis not present

## 2021-06-02 LAB — BODY FLUID CULTURE W GRAM STAIN
Culture: NO GROWTH
Gram Stain: NONE SEEN

## 2021-06-02 MED ORDER — ONDANSETRON HCL 4 MG PO TABS: 4.0000 mg | ORAL_TABLET | Freq: Three times a day (TID) | ORAL | 0 refills | Status: DC | PRN

## 2021-06-02 MED ORDER — DROPERIDOL 2.5 MG/ML IJ SOLN
2.5000 mg | Freq: Once | INTRAMUSCULAR | Status: AC
  Administered 2021-06-02: 2.5 mg via INTRAVENOUS
  Filled 2021-06-02: qty 2

## 2021-06-02 NOTE — ED Notes (Signed)
Pt given a cup of water 

## 2021-06-02 NOTE — Discharge Instructions (Signed)
You were seen today with nausea and vomiting.  This is likely related to your gastroparesis.  Zofran prescription will be provided.  Make sure that you are drinking plenty of fluids and eating small meals. ?

## 2021-06-02 NOTE — ED Provider Notes (Signed)
?[facility] ?Provider Note ? ? ?CSN: [id number] ?Arrival date & time: 06/01/21  2032 ? ?  ? ?History ? ?Chief Complaint  ?Patient presents with  ? Emesis  ? ? ?Barbara Donovan is a 31 y.o. female. ? ?HPI ? ?  ? ?This is a 31 year old female with a history of end-stage renal disease on peritoneal dialysis who presents with emesis.  Patient was admitted to the hospital and discharged on 5/13 for gastroparesis.  She has a history of recurrent emesis and gastroparesis.  She has been taking Reglan at home with minimal improvement.  Denies abdominal pain or fevers.  Last dialyzed last night.  Denies alcohol or drug use. ? ?Home Medications ?Prior to Admission medications   ?Medication Sig Start Date End Date Taking? Authorizing Provider  ?amLODipine (NORVASC) 10 MG tablet Take 1 tablet (10 mg total) by mouth daily. ?Patient taking differently: Take 10 mg by mouth every evening. 03/09/20  Yes Gonfa, Taye T, MD  ?brimonidine (ALPHAGAN) 0.2 % ophthalmic solution Place 1 drop into the left eye 3 (three) times daily. 03/09/21  Yes [provider]  ?calcitRIOL (ROCALTROL) 0.5 MCG capsule Take 0.5 mcg by mouth daily. 12/02/20  Yes [provider]  ?carvedilol (COREG) 25 MG tablet Take 1 tablet (25 mg total) by mouth 2 (two) times daily. 03/07/19  Yes Imogene Burn, PA-C  ?ferrous sulfate 325 (65 FE) MG tablet Take 325 mg by mouth daily. 05/12/21  Yes [provider]  ?FLUoxetine (PROZAC) 40 MG capsule Take 40 mg by mouth every morning. 05/15/21  Yes [provider]  ?hydrALAZINE (APRESOLINE) 50 MG tablet Take 1 tablet by mouth twice a day, may take 1 extra tablet by mouth daily only as needed for elevated blood pressure ?Patient taking differently: Take 50 mg by mouth See admin instructions. Take 50mg  by mouth twice a day, may take an additional 50mg  by mouth daily only as needed for elevated blood pressure. 04/28/21  Yes Imogene Burn, PA-C  ?insulin  glargine (LANTUS SOLOSTAR) 100 UNIT/ML Solostar Pen Inject 20 Units into the skin every morning. 05/11/21  Yes Renato Shin, MD  ?insulin lispro (HUMALOG KWIKPEN) 100 UNIT/ML KwikPen 3 times a day (just before each meal) 4-3-3 units, and pen needles 4/day ?Patient taking differently: Inject 3-4 Units into the skin See admin instructions. 4 units with breakfast ?3 units with lunch and dinner 04/21/21  Yes Renato Shin, MD  ?metoCLOPramide (REGLAN) 5 MG tablet Take 1 tablet (5 mg total) by mouth 3 (three) times daily before meals. 02/23/21  Yes Sherwood Gambler, MD  ?multivitamin (RENA-VIT) TABS tablet Take 1 tablet by mouth at bedtime. 03/08/20  Yes Mercy Riding, MD  ?ondansetron (ZOFRAN) 4 MG tablet Take 1 tablet (4 mg total) by mouth every 8 (eight) hours as needed for nausea or vomiting. 06/02/21  Yes Sanay Belmar, Barbette Hair, MD  ?ondansetron (ZOFRAN-ODT) 4 MG disintegrating tablet Take 1 tablet (4 mg total) by mouth every 6 (six) hours as needed for nausea or vomiting. 04/07/21  Yes Demaio, Alexa, MD  ?promethazine (PHENERGAN) 12.5 MG tablet Take 1 tablet (12.5 mg total) by mouth every 6 (six) hours as needed for nausea or vomiting. 04/26/21  Yes Katsadouros, Vasilios, MD  ?sevelamer carbonate (RENVELA) 800 MG tablet Take 1 tablet (800 mg total) by mouth 3 (three) times daily with meals. 02/05/21  Yes Bonnielee Haff, MD  ?Continuous Blood Gluc Receiver DEVI 1 Units by Does not apply route 4 (four) times  daily -  before meals and at bedtime. May substitute for cheapest monitor with insurance 03/27/21   Scarlett Presto, MD  ?Continuous Blood Gluc Sensor MISC 1 each by Does not apply route as directed. Use as directed every 14 days. May dispense FreeStyle [employer] or similar. 01/27/20   Antonieta Pert, MD  ?Continuous Glucose Monitor Sup MISC 1 Units by Does not apply route 4 (four) times daily -  before meals and at bedtime. May substitute for cheapest option with patient insurance 03/27/21 03/27/22  Scarlett Presto, MD  ?glucose  blood (FREESTYLE TEST STRIPS) test strip Use as instructed 02/25/21   Terrilee Croak, MD  ?   ? ?Allergies    ?Patient has no known allergies.   ? ?Review of Systems   ?Review of Systems  ?Constitutional:  Negative for fever.  ?Gastrointestinal:  Positive for nausea and vomiting. Negative for abdominal pain.  ?All other systems reviewed and are negative. ? ?Physical Exam ?Updated Vital Signs ?BP (!) 167/94   Pulse 92   Temp 97.9 ?F (36.6 ?C) (Oral)   Resp 16   SpO2 99%  ?Physical Exam ?Vitals and nursing note reviewed.  ?Constitutional:   ?   Appearance: She is well-developed.  ?   Comments: Appears older than stated age  ?HENT:  ?   Head: Normocephalic and atraumatic.  ?   Mouth/Throat:  ?   Mouth: Mucous membranes are dry.  ?Eyes:  ?   Pupils: Pupils are equal, round, and reactive to light.  ?Cardiovascular:  ?   Rate and Rhythm: Regular rhythm. Tachycardia present.  ?   Heart sounds: Normal heart sounds.  ?Pulmonary:  ?   Effort: Pulmonary effort is normal. No respiratory distress.  ?   Breath sounds: No wheezing.  ?Abdominal:  ?   Palpations: Abdomen is soft.  ?   Tenderness: There is no abdominal tenderness.  ?   Comments: Dialysis catheter left mid abdomen, no adjacent erythema  ?Musculoskeletal:  ?   Cervical back: Neck supple.  ?   Right lower leg: No edema.  ?   Left lower leg: No edema.  ?Skin: ?   General: Skin is warm and dry.  ?Neurological:  ?   Mental Status: She is alert and oriented to person, place, and time.  ?Psychiatric:     ?   Mood and Affect: Mood normal.  ? ? ?ED Results / Procedures / Treatments   ?Labs ?(all labs ordered are listed, but only abnormal results are displayed) ?Labs Reviewed  ?COMPREHENSIVE METABOLIC PANEL - Abnormal; Notable for the following components:  ?    Result Value  ? Sodium 133 (*)   ? Potassium 3.4 (*)   ? CO2 17 (*)   ? Glucose, Bld 135 (*)   ? BUN 63 (*)   ? Creatinine, Ser 9.18 (*)   ? Calcium 10.6 (*)   ? GFR, Estimated 5 (*)   ? Anion gap 18 (*)   ? All other  components within normal limits  ?CBC WITH DIFFERENTIAL/PLATELET - Abnormal; Notable for the following components:  ? WBC 10.8 (*)   ? Hemoglobin 11.2 (*)   ? HCT 33.4 (*)   ? Platelets 438 (*)   ? Neutro Abs 8.2 (*)   ? All other components within normal limits  ?LIPASE, BLOOD - Abnormal; Notable for the following components:  ? Lipase 56 (*)   ? All other components within normal limits  ?I-STAT BETA HCG BLOOD, ED ([facility], [facility],  [facility] ONLY)  ? ? ?EKG ?EKG Interpretation ? ?Date/Time:  Tuesday Jun 02 2021 03:59:49 EDT ?Ventricular Rate:  90 ?PR Interval:  191 ?QRS Duration: 96 ?QT Interval:  382 ?QTC Calculation: 468 ?R Axis:   81 ?Text Interpretation: Sinus rhythm Confirmed by Thayer Jew [phone]) on 06/02/2021 4:19:55 AM ? ?Radiology ?No results found. ? ?Procedures ?Procedures  ? ? ?Medications Ordered in ED ?Medications  ?metoCLOPramide (REGLAN) injection 5 mg (5 mg Intravenous Given 06/01/21 2044)  ?droperidol (INAPSINE) 2.5 MG/ML injection 2.5 mg (2.5 mg Intravenous Given 06/02/21 0423)  ? ? ?ED Course/ Medical Decision Making/ A&P ?Clinical Course as of 06/02/21 0643  ?Tue Jun 02, 2021  ?0630 Patient tolerating fluids.  Patient reports that she is out of Zofran. [CH]  ?  ?Clinical Course User Index ?[CH] Dermot Gremillion, Barbette Hair, MD  ? ?                        ?Medical Decision Making ?Risk ?Prescription drug management. ? ? ?This patient presents to the ED for concern of vomiting, this involves an extensive number of treatment options, and is a complaint that carries with it a high risk of complications and morbidity.  I considered the following differential and admission for this acute, potentially life threatening condition.  The differential diagnosis includes gastroparesis, gastritis, gastroenteritis, obstruction, less likely cholecystitis or pancreatitis ? ?MDM:   ? ?This is a 31 year old female with a history of end-stage renal disease on peritoneal dialysis who presents with ongoing nausea and vomiting.  Was seen  and evaluated for an admission recently for the same.  Was discharged with Reglan.  Reports primarily nonbilious, nonbloody emesis.  Denies any abdominal pain.  Last dialyzed yesterday.  Patient was given dr

## 2021-07-02 ENCOUNTER — Encounter (HOSPITAL_COMMUNITY): Payer: Self-pay

## 2021-07-02 ENCOUNTER — Inpatient Hospital Stay (HOSPITAL_COMMUNITY)
Admission: EM | Admit: 2021-07-02 | Discharge: 2021-07-11 | DRG: 981 | Disposition: A | Discharge status: Home / Self Care | Payer: 59 | Attending: Internal Medicine | Admitting: Internal Medicine

## 2021-07-02 ENCOUNTER — Other Ambulatory Visit: Payer: Self-pay

## 2021-07-02 ENCOUNTER — Emergency Department (HOSPITAL_COMMUNITY): Payer: 59

## 2021-07-02 DIAGNOSIS — Y831 Surgical operation with implant of artificial internal device as the cause of abnormal reaction of the patient, or of later complication, without mention of misadventure at the time of the procedure: Secondary | ICD-10-CM | POA: Diagnosis present

## 2021-07-02 DIAGNOSIS — E10319 Type 1 diabetes mellitus with unspecified diabetic retinopathy without macular edema: Secondary | ICD-10-CM | POA: Diagnosis present

## 2021-07-02 DIAGNOSIS — M130 Polyarthritis, unspecified: Secondary | ICD-10-CM | POA: Diagnosis present

## 2021-07-02 DIAGNOSIS — I10 Essential (primary) hypertension: Secondary | ICD-10-CM | POA: Diagnosis present

## 2021-07-02 DIAGNOSIS — E1043 Type 1 diabetes mellitus with diabetic autonomic (poly)neuropathy: Secondary | ICD-10-CM | POA: Diagnosis present

## 2021-07-02 DIAGNOSIS — Z825 Family history of asthma and other chronic lower respiratory diseases: Secondary | ICD-10-CM

## 2021-07-02 DIAGNOSIS — A419 Sepsis, unspecified organism: Secondary | ICD-10-CM | POA: Diagnosis present

## 2021-07-02 DIAGNOSIS — Z79899 Other long term (current) drug therapy: Secondary | ICD-10-CM

## 2021-07-02 DIAGNOSIS — D631 Anemia in chronic kidney disease: Secondary | ICD-10-CM | POA: Diagnosis present

## 2021-07-02 DIAGNOSIS — Z992 Dependence on renal dialysis: Secondary | ICD-10-CM

## 2021-07-02 DIAGNOSIS — F419 Anxiety disorder, unspecified: Secondary | ICD-10-CM | POA: Diagnosis present

## 2021-07-02 DIAGNOSIS — N2581 Secondary hyperparathyroidism of renal origin: Secondary | ICD-10-CM | POA: Diagnosis present

## 2021-07-02 DIAGNOSIS — R7881 Bacteremia: Secondary | ICD-10-CM

## 2021-07-02 DIAGNOSIS — E1022 Type 1 diabetes mellitus with diabetic chronic kidney disease: Secondary | ICD-10-CM | POA: Diagnosis present

## 2021-07-02 DIAGNOSIS — E1042 Type 1 diabetes mellitus with diabetic polyneuropathy: Secondary | ICD-10-CM | POA: Diagnosis present

## 2021-07-02 DIAGNOSIS — N186 End stage renal disease: Secondary | ICD-10-CM

## 2021-07-02 DIAGNOSIS — F329 Major depressive disorder, single episode, unspecified: Secondary | ICD-10-CM | POA: Diagnosis present

## 2021-07-02 DIAGNOSIS — I120 Hypertensive chronic kidney disease with stage 5 chronic kidney disease or end stage renal disease: Secondary | ICD-10-CM | POA: Diagnosis present

## 2021-07-02 DIAGNOSIS — K3184 Gastroparesis: Secondary | ICD-10-CM | POA: Diagnosis present

## 2021-07-02 DIAGNOSIS — T80211A Bloodstream infection due to central venous catheter, initial encounter: Principal | ICD-10-CM | POA: Diagnosis present

## 2021-07-02 DIAGNOSIS — Z20822 Contact with and (suspected) exposure to covid-19: Secondary | ICD-10-CM | POA: Diagnosis present

## 2021-07-02 DIAGNOSIS — E1010 Type 1 diabetes mellitus with ketoacidosis without coma: Secondary | ICD-10-CM | POA: Diagnosis present

## 2021-07-02 DIAGNOSIS — Z833 Family history of diabetes mellitus: Secondary | ICD-10-CM

## 2021-07-02 DIAGNOSIS — M898X9 Other specified disorders of bone, unspecified site: Secondary | ICD-10-CM | POA: Diagnosis present

## 2021-07-02 DIAGNOSIS — E875 Hyperkalemia: Secondary | ICD-10-CM | POA: Diagnosis present

## 2021-07-02 DIAGNOSIS — Z8249 Family history of ischemic heart disease and other diseases of the circulatory system: Secondary | ICD-10-CM

## 2021-07-02 DIAGNOSIS — E871 Hypo-osmolality and hyponatremia: Secondary | ICD-10-CM | POA: Diagnosis present

## 2021-07-02 DIAGNOSIS — B9561 Methicillin susceptible Staphylococcus aureus infection as the cause of diseases classified elsewhere: Secondary | ICD-10-CM

## 2021-07-02 DIAGNOSIS — Z823 Family history of stroke: Secondary | ICD-10-CM

## 2021-07-02 DIAGNOSIS — Z794 Long term (current) use of insulin: Secondary | ICD-10-CM

## 2021-07-02 DIAGNOSIS — A4101 Sepsis due to Methicillin susceptible Staphylococcus aureus: Secondary | ICD-10-CM | POA: Diagnosis present

## 2021-07-02 LAB — COMPREHENSIVE METABOLIC PANEL
ALT: 20 U/L (ref 0–44)
AST: 51 U/L — ABNORMAL HIGH (ref 15–41)
Albumin: 3.5 g/dL (ref 3.5–5.0)
Alkaline Phosphatase: 70 U/L (ref 38–126)
Anion gap: 20 — ABNORMAL HIGH (ref 5–15)
BUN: 59 mg/dL — ABNORMAL HIGH (ref 6–20)
CO2: 19 mmol/L — ABNORMAL LOW (ref 22–32)
Calcium: 9.8 mg/dL (ref 8.9–10.3)
Chloride: 88 mmol/L — ABNORMAL LOW (ref 98–111)
Creatinine, Ser: 8.23 mg/dL — ABNORMAL HIGH (ref 0.44–1.00)
GFR, Estimated: 6 mL/min — ABNORMAL LOW (ref >60)
Glucose, Bld: 263 mg/dL — ABNORMAL HIGH (ref 70–99)
Potassium: 5.5 mmol/L — ABNORMAL HIGH (ref 3.5–5.1)
Sodium: 127 mmol/L — ABNORMAL LOW (ref 135–145)
Total Bilirubin: 1.3 mg/dL — ABNORMAL HIGH (ref 0.3–1.2)
Total Protein: 8.2 g/dL — ABNORMAL HIGH (ref 6.5–8.1)

## 2021-07-02 LAB — CBC
HCT: 31.5 % — ABNORMAL LOW (ref 36.0–46.0)
Hemoglobin: 10.7 g/dL — ABNORMAL LOW (ref 12.0–15.0)
MCH: 28.2 pg (ref 26.0–34.0)
MCHC: 34 g/dL (ref 30.0–36.0)
MCV: 83.1 fL (ref 80.0–100.0)
Platelets: 169 10*3/uL (ref 150–400)
RBC: 3.79 MIL/uL — ABNORMAL LOW (ref 3.87–5.11)
RDW: 15.8 % — ABNORMAL HIGH (ref 11.5–15.5)
WBC: 15.4 10*3/uL — ABNORMAL HIGH (ref 4.0–10.5)
nRBC: 0 % (ref 0.0–0.2)

## 2021-07-02 IMAGING — DX DG CHEST 1V
1 series · 1 of 1 positions shown · non-contrast
Comparison: Most recent radiograph [DATE]

CLINICAL DATA: Nausea, back and arm pain.  Leg pain.

EXAM:
CHEST  1 VIEW

[chest ap]
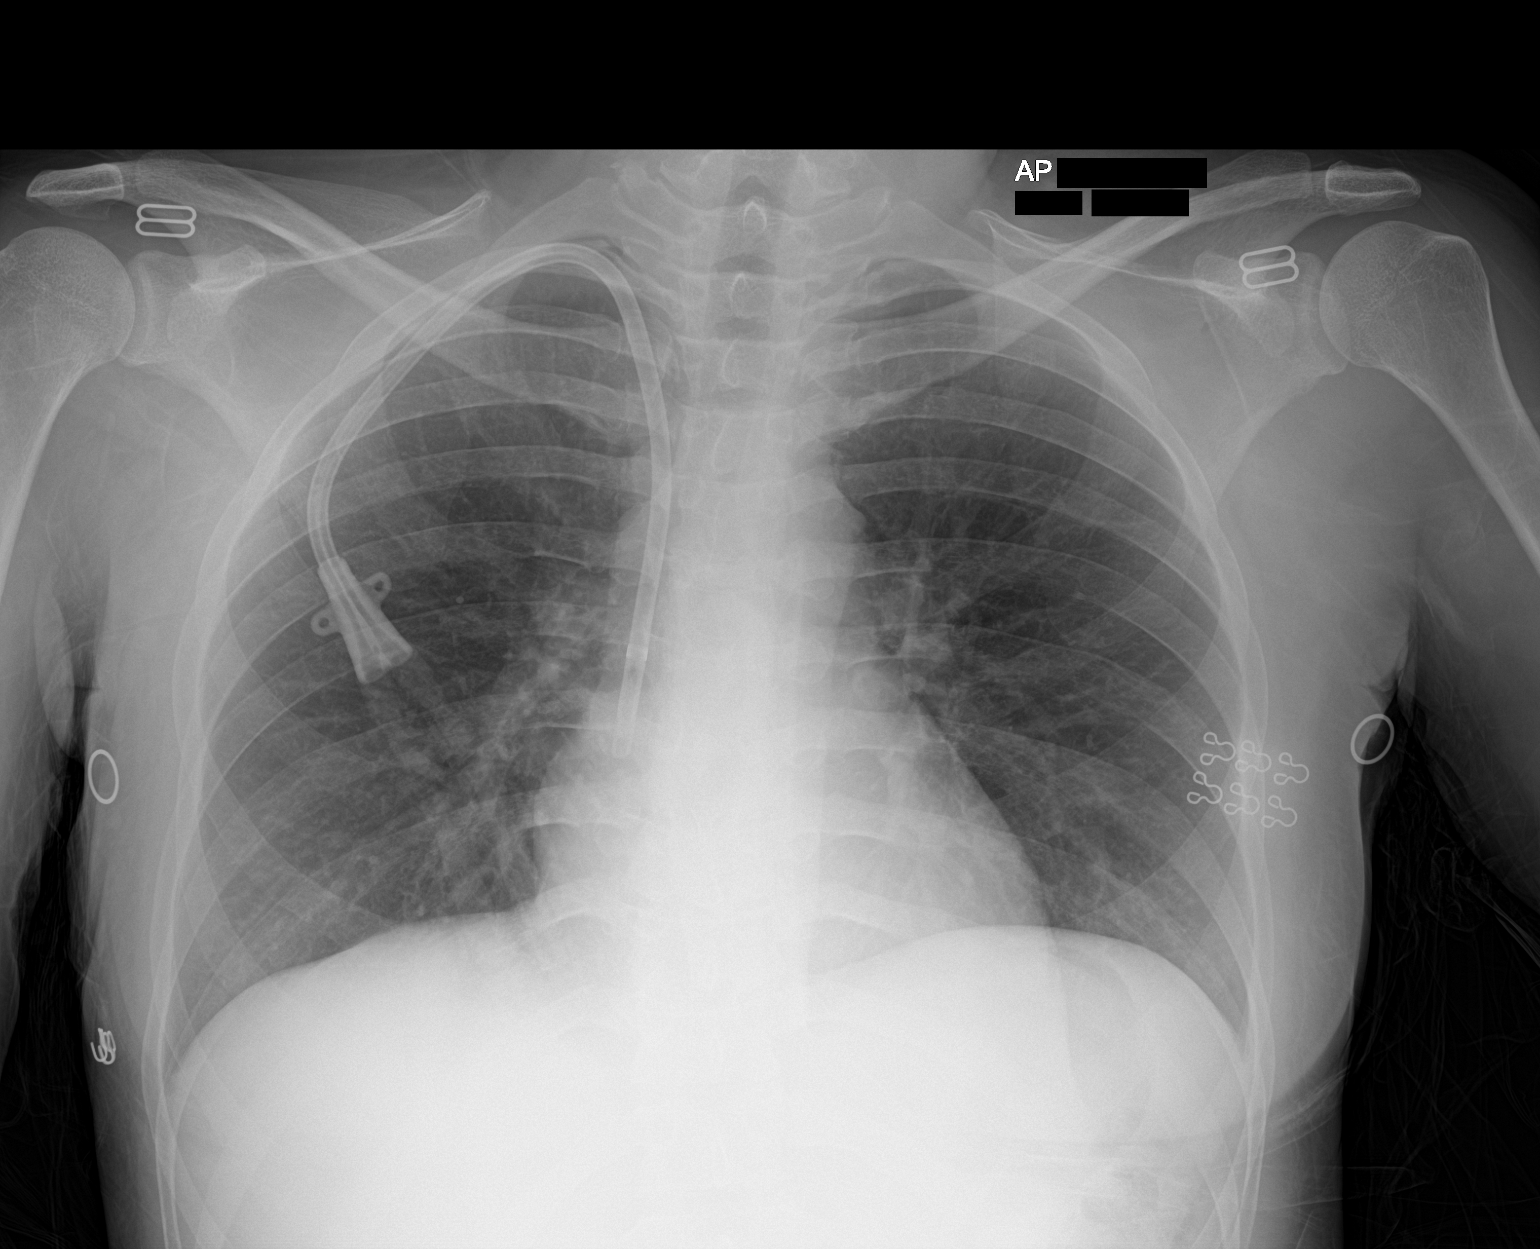

[1 of 1 positions shown; findings below may reference images not displayed]

FINDINGS: Right-sided dialysis catheter tip overlies the atrial caval
junction. The heart is normal in size. Lower lung volumes from prior
exam. No focal airspace disease, pulmonary edema, pleural effusion
or pneumothorax. No acute osseous findings.
IMPRESSION: Low lung volumes without acute chest finding.

## 2021-07-02 MED ORDER — FENTANYL CITRATE PF 50 MCG/ML IJ SOSY
50.0000 ug | PREFILLED_SYRINGE | Freq: Once | INTRAMUSCULAR | Status: AC
  Administered 2021-07-02: 50 ug via INTRAVENOUS
  Filled 2021-07-02: qty 1

## 2021-07-02 MED ORDER — INSULIN REGULAR(HUMAN) IN NACL 100-0.9 UT/100ML-% IV SOLN
INTRAVENOUS | Status: DC
  Administered 2021-07-03: 2 [IU]/h via INTRAVENOUS
  Filled 2021-07-02: qty 100

## 2021-07-02 MED ORDER — DEXTROSE 50 % IV SOLN: 0.0000 mL | INTRAVENOUS | Status: DC | PRN

## 2021-07-02 MED ORDER — ONDANSETRON HCL 4 MG PO TABS
8.0000 mg | ORAL_TABLET | Freq: Once | ORAL | Status: DC
Start: 2021-07-02 — End: 2021-07-02

## 2021-07-02 MED ORDER — DEXTROSE-NACL 5-0.45 % IV SOLN: INTRAVENOUS | Status: DC

## 2021-07-02 MED ORDER — ONDANSETRON 8 MG PO TBDP: 8.0000 mg | ORAL_TABLET | Freq: Once | ORAL | Status: DC

## 2021-07-02 MED ORDER — SODIUM CHLORIDE 0.9 % IV SOLN
8.0000 mg | Freq: Once | INTRAVENOUS | Status: AC
  Administered 2021-07-02: 8 mg via INTRAVENOUS
  Filled 2021-07-02: qty 4

## 2021-07-02 MED ORDER — SODIUM CHLORIDE 0.9 % IV SOLN: INTRAVENOUS | Status: DC

## 2021-07-02 NOTE — ED Provider Notes (Signed)
[facility] DEPT Provider Note   CSN: [id number] Arrival date & time: 07/02/21  2024  History Chief Complaint  Patient presents with   Back Pain   Barbara Donovan is a 31 y.o. female.  31 year old female presents with 2-day history of worsening nausea and bilateral back, arm, and leg pain.  Significant PMH includes T1DM, ESRD on HD (reportedly M/T/F), diabetic retinopathy, HTN, gastroparesis, anemia of chronic disease, history of DKA.  Denies any missed HD or missed insulin doses.  Uses Lantus and Humalog.  No recent illnesses or injuries.  She reports her glucose this morning was 290, denies any glucose readings out of the ordinary for her recently.   Home Medications Prior to Admission medications   Medication Sig Start Date End Date Taking? Authorizing Provider  amLODipine (NORVASC) 10 MG tablet Take 1 tablet (10 mg total) by mouth daily. Patient taking differently: Take 10 mg by mouth every evening. 03/09/20   Mercy Riding, MD  brimonidine (ALPHAGAN) 0.2 % ophthalmic solution Place 1 drop into the left eye 3 (three) times daily. 03/09/21   [provider]  calcitRIOL (ROCALTROL) 0.5 MCG capsule Take 0.5 mcg by mouth daily. 12/02/20   [provider]  carvedilol (COREG) 25 MG tablet Take 1 tablet (25 mg total) by mouth 2 (two) times daily. 03/07/19   Imogene Burn, PA-C  Continuous Blood Gluc Receiver DEVI 1 Units by Does not apply route 4 (four) times daily -  before meals and at bedtime. May substitute for cheapest monitor with insurance 03/27/21   Scarlett Presto, MD  Continuous Blood Gluc Sensor MISC 1 each by Does not apply route as directed. Use as directed every 14 days. May dispense FreeStyle [employer] or similar. 01/27/20   Antonieta Pert, MD  Continuous Glucose Monitor Sup MISC 1 Units by Does not apply route 4 (four) times daily -  before meals and at bedtime. May substitute for cheapest option with patient insurance 03/27/21  03/27/22  Scarlett Presto, MD  ferrous sulfate 325 (65 FE) MG tablet Take 325 mg by mouth daily. 05/12/21   [provider]  FLUoxetine (PROZAC) 40 MG capsule Take 40 mg by mouth every morning. 05/15/21   [provider]  glucose blood (FREESTYLE TEST STRIPS) test strip Use as instructed 02/25/21   Terrilee Croak, MD  hydrALAZINE (APRESOLINE) 50 MG tablet Take 1 tablet by mouth twice a day, may take 1 extra tablet by mouth daily only as needed for elevated blood pressure Patient taking differently: Take 50 mg by mouth See admin instructions. Take 50mg  by mouth twice a day, may take an additional 50mg  by mouth daily only as needed for elevated blood pressure. 04/28/21   Imogene Burn, PA-C  insulin glargine (LANTUS SOLOSTAR) 100 UNIT/ML Solostar Pen Inject 20 Units into the skin every morning. 05/11/21   Renato Shin, MD  insulin lispro (HUMALOG KWIKPEN) 100 UNIT/ML KwikPen 3 times a day (just before each meal) 4-3-3 units, and pen needles 4/day Patient taking differently: Inject 3-4 Units into the skin See admin instructions. 4 units with breakfast 3 units with lunch and dinner 04/21/21   Renato Shin, MD  metoCLOPramide (REGLAN) 5 MG tablet Take 1 tablet (5 mg total) by mouth 3 (three) times daily before meals. 02/23/21   Sherwood Gambler, MD  multivitamin (RENA-VIT) TABS tablet Take 1 tablet by mouth at bedtime. 03/08/20   Mercy Riding, MD  ondansetron (ZOFRAN) 4 MG tablet Take 1 tablet (4  mg total) by mouth every 8 (eight) hours as needed for nausea or vomiting. 06/02/21   Horton, Barbette Hair, MD  ondansetron (ZOFRAN-ODT) 4 MG disintegrating tablet Take 1 tablet (4 mg total) by mouth every 6 (six) hours as needed for nausea or vomiting. 04/07/21   Demaio, Alexa, MD  promethazine (PHENERGAN) 12.5 MG tablet Take 1 tablet (12.5 mg total) by mouth every 6 (six) hours as needed for nausea or vomiting. 04/26/21   Riesa Pope, MD  sevelamer carbonate (RENVELA) 800 MG tablet Take 1 tablet (800  mg total) by mouth 3 (three) times daily with meals. 02/05/21   Bonnielee Haff, MD     Allergies    Patient has no known allergies.    Review of Systems   Review of Systems  Constitutional:  Positive for fatigue. Negative for chills and fever.  HENT:  Negative for congestion, rhinorrhea and sinus pain.   Eyes:  Negative for pain and redness.  Respiratory:  Negative for cough, chest tightness and shortness of breath.   Cardiovascular:  Negative for chest pain.  Gastrointestinal:  Positive for nausea. Negative for abdominal pain, constipation, diarrhea and vomiting.  Genitourinary:  Negative for difficulty urinating and dysuria.   Physical Exam Updated Vital Signs BP (!) 121/94   Pulse (!) 104   Temp 98.2 F (36.8 C)   Resp 19   Ht 5\' 6"  (1.676 m)   Wt 65.8 kg   SpO2 100%   BMI 23.40 kg/m  Physical Exam Vitals and nursing note reviewed.  Constitutional:      General: She is not in acute distress.    Appearance: Normal appearance. She is ill-appearing. She is not toxic-appearing or diaphoretic.  HENT:     Head: Normocephalic and atraumatic.     Nose: Nose normal.     Mouth/Throat:     Mouth: Mucous membranes are moist.  Eyes:     Conjunctiva/sclera: Conjunctivae normal.  Cardiovascular:     Rate and Rhythm: Normal rate and regular rhythm.     Pulses: Normal pulses.  Pulmonary:     Effort: Pulmonary effort is normal.     Breath sounds: Normal breath sounds.  Skin:    General: Skin is warm.     Capillary Refill: Capillary refill takes less than 2 seconds.  Neurological:     General: No focal deficit present.     Mental Status: She is alert and oriented to person, place, and time.    ED Results / Procedures / Treatments   Labs (all labs ordered are listed, but only abnormal results are displayed) Labs Reviewed  CBC - Abnormal; Notable for the following components:      Result Value   WBC 15.4 (*)    RBC 3.79 (*)    Hemoglobin 10.7 (*)    HCT 31.5 (*)    RDW  15.8 (*)    All other components within normal limits  COMPREHENSIVE METABOLIC PANEL - Abnormal; Notable for the following components:   Sodium 127 (*)    Potassium 5.5 (*)    Chloride 88 (*)    CO2 19 (*)    Glucose, Bld 263 (*)    BUN 59 (*)    Creatinine, Ser 8.23 (*)    Total Protein 8.2 (*)    AST 51 (*)    Total Bilirubin 1.3 (*)    GFR, Estimated 6 (*)    Anion gap 20 (*)    All other components within normal limits  EKG None  Radiology No results found.  Procedures Procedures   Medications Ordered in ED Medications  fentaNYL (SUBLIMAZE) injection 50 mcg (50 mcg Intravenous Given 07/02/21 2249)  ondansetron (ZOFRAN) 8 mg in sodium chloride 0.9 % 50 mL IVPB (0 mg Intravenous Stopped 07/02/21 2306)    ED Course/ Medical Decision Making/ A&P                           Medical Decision Making 31 year old female presenting with 2-day history of worsening nausea and pain in her bilateral arms, legs, and back.  Concern for mild DKA with anion gap of 20 and bicarb 19.  Awaiting UA, BHB, VBG, CK, and CXR at this time. Started Endo tool in ED, consulted hospitalist for admission.  [facility] need [facility].  Amount and/or Complexity of Data Reviewed Labs: ordered.  Risk Prescription drug management. Decision regarding hospitalization.   Final Clinical Impression(s) / ED Diagnoses Final diagnoses:  None   Rx / DC Orders ED Discharge Orders     None      Ezequiel Essex, MD   Ezequiel Essex, MD 07/02/21 0626    Lucrezia Starch, MD 07/03/21 2337

## 2021-07-02 NOTE — ED Triage Notes (Signed)
BIB [facility] with c/o bilateral upper leg pain x 2 days. Denies MOI.

## 2021-07-03 ENCOUNTER — Inpatient Hospital Stay (HOSPITAL_COMMUNITY): Payer: 59

## 2021-07-03 DIAGNOSIS — M898X9 Other specified disorders of bone, unspecified site: Secondary | ICD-10-CM | POA: Diagnosis present

## 2021-07-03 DIAGNOSIS — Z992 Dependence on renal dialysis: Secondary | ICD-10-CM

## 2021-07-03 DIAGNOSIS — R7881 Bacteremia: Secondary | ICD-10-CM | POA: Diagnosis not present

## 2021-07-03 DIAGNOSIS — I120 Hypertensive chronic kidney disease with stage 5 chronic kidney disease or end stage renal disease: Secondary | ICD-10-CM | POA: Diagnosis present

## 2021-07-03 DIAGNOSIS — F329 Major depressive disorder, single episode, unspecified: Secondary | ICD-10-CM | POA: Diagnosis present

## 2021-07-03 DIAGNOSIS — K3184 Gastroparesis: Secondary | ICD-10-CM | POA: Diagnosis present

## 2021-07-03 DIAGNOSIS — Z20822 Contact with and (suspected) exposure to covid-19: Secondary | ICD-10-CM | POA: Diagnosis present

## 2021-07-03 DIAGNOSIS — E1010 Type 1 diabetes mellitus with ketoacidosis without coma: Secondary | ICD-10-CM | POA: Diagnosis present

## 2021-07-03 DIAGNOSIS — F419 Anxiety disorder, unspecified: Secondary | ICD-10-CM | POA: Diagnosis present

## 2021-07-03 DIAGNOSIS — E1043 Type 1 diabetes mellitus with diabetic autonomic (poly)neuropathy: Secondary | ICD-10-CM | POA: Diagnosis present

## 2021-07-03 DIAGNOSIS — A419 Sepsis, unspecified organism: Secondary | ICD-10-CM

## 2021-07-03 DIAGNOSIS — Z79899 Other long term (current) drug therapy: Secondary | ICD-10-CM | POA: Diagnosis not present

## 2021-07-03 DIAGNOSIS — Z794 Long term (current) use of insulin: Secondary | ICD-10-CM | POA: Diagnosis not present

## 2021-07-03 DIAGNOSIS — Z825 Family history of asthma and other chronic lower respiratory diseases: Secondary | ICD-10-CM | POA: Diagnosis not present

## 2021-07-03 DIAGNOSIS — Z8249 Family history of ischemic heart disease and other diseases of the circulatory system: Secondary | ICD-10-CM | POA: Diagnosis not present

## 2021-07-03 DIAGNOSIS — E871 Hypo-osmolality and hyponatremia: Secondary | ICD-10-CM | POA: Diagnosis present

## 2021-07-03 DIAGNOSIS — E875 Hyperkalemia: Secondary | ICD-10-CM | POA: Diagnosis present

## 2021-07-03 DIAGNOSIS — T80211A Bloodstream infection due to central venous catheter, initial encounter: Secondary | ICD-10-CM | POA: Diagnosis present

## 2021-07-03 DIAGNOSIS — T8571XA Infection and inflammatory reaction due to peritoneal dialysis catheter, initial encounter: Secondary | ICD-10-CM | POA: Diagnosis not present

## 2021-07-03 DIAGNOSIS — I10 Essential (primary) hypertension: Secondary | ICD-10-CM | POA: Diagnosis not present

## 2021-07-03 DIAGNOSIS — E10319 Type 1 diabetes mellitus with unspecified diabetic retinopathy without macular edema: Secondary | ICD-10-CM | POA: Diagnosis present

## 2021-07-03 DIAGNOSIS — T82898A Other specified complication of vascular prosthetic devices, implants and grafts, initial encounter: Secondary | ICD-10-CM | POA: Diagnosis not present

## 2021-07-03 DIAGNOSIS — B958 Unspecified staphylococcus as the cause of diseases classified elsewhere: Secondary | ICD-10-CM | POA: Diagnosis not present

## 2021-07-03 DIAGNOSIS — M130 Polyarthritis, unspecified: Secondary | ICD-10-CM

## 2021-07-03 DIAGNOSIS — N186 End stage renal disease: Secondary | ICD-10-CM

## 2021-07-03 DIAGNOSIS — E1022 Type 1 diabetes mellitus with diabetic chronic kidney disease: Secondary | ICD-10-CM | POA: Diagnosis present

## 2021-07-03 DIAGNOSIS — Y831 Surgical operation with implant of artificial internal device as the cause of abnormal reaction of the patient, or of later complication, without mention of misadventure at the time of the procedure: Secondary | ICD-10-CM | POA: Diagnosis present

## 2021-07-03 DIAGNOSIS — E1042 Type 1 diabetes mellitus with diabetic polyneuropathy: Secondary | ICD-10-CM | POA: Diagnosis present

## 2021-07-03 DIAGNOSIS — B9561 Methicillin susceptible Staphylococcus aureus infection as the cause of diseases classified elsewhere: Secondary | ICD-10-CM | POA: Diagnosis not present

## 2021-07-03 DIAGNOSIS — E1122 Type 2 diabetes mellitus with diabetic chronic kidney disease: Secondary | ICD-10-CM | POA: Diagnosis not present

## 2021-07-03 DIAGNOSIS — N185 Chronic kidney disease, stage 5: Secondary | ICD-10-CM | POA: Diagnosis not present

## 2021-07-03 DIAGNOSIS — A4101 Sepsis due to Methicillin susceptible Staphylococcus aureus: Secondary | ICD-10-CM | POA: Diagnosis present

## 2021-07-03 DIAGNOSIS — N2581 Secondary hyperparathyroidism of renal origin: Secondary | ICD-10-CM | POA: Diagnosis present

## 2021-07-03 DIAGNOSIS — D631 Anemia in chronic kidney disease: Secondary | ICD-10-CM | POA: Diagnosis present

## 2021-07-03 LAB — BASIC METABOLIC PANEL
Anion gap: 14 (ref 5–15)
Anion gap: 15 (ref 5–15)
Anion gap: 16 — ABNORMAL HIGH (ref 5–15)
Anion gap: 17 — ABNORMAL HIGH (ref 5–15)
Anion gap: 18 — ABNORMAL HIGH (ref 5–15)
Anion gap: 20 — ABNORMAL HIGH (ref 5–15)
BUN: 58 mg/dL — ABNORMAL HIGH (ref 6–20)
BUN: 59 mg/dL — ABNORMAL HIGH (ref 6–20)
BUN: 62 mg/dL — ABNORMAL HIGH (ref 6–20)
BUN: 64 mg/dL — ABNORMAL HIGH (ref 6–20)
BUN: 64 mg/dL — ABNORMAL HIGH (ref 6–20)
BUN: 65 mg/dL — ABNORMAL HIGH (ref 6–20)
CO2: 16 mmol/L — ABNORMAL LOW (ref 22–32)
CO2: 17 mmol/L — ABNORMAL LOW (ref 22–32)
CO2: 17 mmol/L — ABNORMAL LOW (ref 22–32)
CO2: 19 mmol/L — ABNORMAL LOW (ref 22–32)
CO2: 19 mmol/L — ABNORMAL LOW (ref 22–32)
CO2: 20 mmol/L — ABNORMAL LOW (ref 22–32)
Calcium: 8.9 mg/dL (ref 8.9–10.3)
Calcium: 8.9 mg/dL (ref 8.9–10.3)
Calcium: 9 mg/dL (ref 8.9–10.3)
Calcium: 9.1 mg/dL (ref 8.9–10.3)
Calcium: 9.4 mg/dL (ref 8.9–10.3)
Calcium: 9.4 mg/dL (ref 8.9–10.3)
Chloride: 90 mmol/L — ABNORMAL LOW (ref 98–111)
Chloride: 90 mmol/L — ABNORMAL LOW (ref 98–111)
Chloride: 91 mmol/L — ABNORMAL LOW (ref 98–111)
Chloride: 91 mmol/L — ABNORMAL LOW (ref 98–111)
Chloride: 93 mmol/L — ABNORMAL LOW (ref 98–111)
Chloride: 94 mmol/L — ABNORMAL LOW (ref 98–111)
Creatinine, Ser: 8.37 mg/dL — ABNORMAL HIGH (ref 0.44–1.00)
Creatinine, Ser: 8.58 mg/dL — ABNORMAL HIGH (ref 0.44–1.00)
Creatinine, Ser: 8.77 mg/dL — ABNORMAL HIGH (ref 0.44–1.00)
Creatinine, Ser: 8.79 mg/dL — ABNORMAL HIGH (ref 0.44–1.00)
Creatinine, Ser: 9.2 mg/dL — ABNORMAL HIGH (ref 0.44–1.00)
Creatinine, Ser: 9.53 mg/dL — ABNORMAL HIGH (ref 0.44–1.00)
GFR, Estimated: 5 mL/min — ABNORMAL LOW (ref >60)
GFR, Estimated: 5 mL/min — ABNORMAL LOW (ref >60)
GFR, Estimated: 6 mL/min — ABNORMAL LOW (ref >60)
GFR, Estimated: 6 mL/min — ABNORMAL LOW (ref >60)
GFR, Estimated: 6 mL/min — ABNORMAL LOW (ref >60)
GFR, Estimated: 6 mL/min — ABNORMAL LOW (ref >60)
Glucose, Bld: 112 mg/dL — ABNORMAL HIGH (ref 70–99)
Glucose, Bld: 125 mg/dL — ABNORMAL HIGH (ref 70–99)
Glucose, Bld: 145 mg/dL — ABNORMAL HIGH (ref 70–99)
Glucose, Bld: 202 mg/dL — ABNORMAL HIGH (ref 70–99)
Glucose, Bld: 239 mg/dL — ABNORMAL HIGH (ref 70–99)
Glucose, Bld: 256 mg/dL — ABNORMAL HIGH (ref 70–99)
Potassium: 3.4 mmol/L — ABNORMAL LOW (ref 3.5–5.1)
Potassium: 3.6 mmol/L (ref 3.5–5.1)
Potassium: 3.6 mmol/L (ref 3.5–5.1)
Potassium: 3.7 mmol/L (ref 3.5–5.1)
Potassium: 3.7 mmol/L (ref 3.5–5.1)
Potassium: 4.3 mmol/L (ref 3.5–5.1)
Sodium: 124 mmol/L — ABNORMAL LOW (ref 135–145)
Sodium: 125 mmol/L — ABNORMAL LOW (ref 135–145)
Sodium: 125 mmol/L — ABNORMAL LOW (ref 135–145)
Sodium: 126 mmol/L — ABNORMAL LOW (ref 135–145)
Sodium: 127 mmol/L — ABNORMAL LOW (ref 135–145)
Sodium: 130 mmol/L — ABNORMAL LOW (ref 135–145)

## 2021-07-03 LAB — URIC ACID: Uric Acid, Serum: 5.6 mg/dL (ref 2.5–7.1)

## 2021-07-03 LAB — HEPATITIS B SURFACE ANTIBODY,QUALITATIVE: Hep B S Ab: REACTIVE — AB

## 2021-07-03 LAB — BETA-HYDROXYBUTYRIC ACID
Beta-Hydroxybutyric Acid: 0.15 mmol/L (ref 0.05–0.27)
Beta-Hydroxybutyric Acid: 0.38 mmol/L — ABNORMAL HIGH (ref 0.05–0.27)
Beta-Hydroxybutyric Acid: 1.37 mmol/L — ABNORMAL HIGH (ref 0.05–0.27)
Beta-Hydroxybutyric Acid: 1.43 mmol/L — ABNORMAL HIGH (ref 0.05–0.27)

## 2021-07-03 LAB — CBC WITH DIFFERENTIAL/PLATELET
Abs Immature Granulocytes: 0.17 10*3/uL — ABNORMAL HIGH (ref 0.00–0.07)
Abs Immature Granulocytes: 0.24 10*3/uL — ABNORMAL HIGH (ref 0.00–0.07)
Basophils Absolute: 0 10*3/uL (ref 0.0–0.1)
Basophils Absolute: 0 10*3/uL (ref 0.0–0.1)
Basophils Relative: 0 %
Basophils Relative: 0 %
Eosinophils Absolute: 0 10*3/uL (ref 0.0–0.5)
Eosinophils Absolute: 0 10*3/uL (ref 0.0–0.5)
Eosinophils Relative: 0 %
Eosinophils Relative: 0 %
HCT: 24.9 % — ABNORMAL LOW (ref 36.0–46.0)
HCT: 27 % — ABNORMAL LOW (ref 36.0–46.0)
Hemoglobin: 8.4 g/dL — ABNORMAL LOW (ref 12.0–15.0)
Hemoglobin: 9 g/dL — ABNORMAL LOW (ref 12.0–15.0)
Immature Granulocytes: 1 %
Immature Granulocytes: 2 %
Lymphocytes Relative: 3 %
Lymphocytes Relative: 4 %
Lymphs Abs: 0.4 10*3/uL — ABNORMAL LOW (ref 0.7–4.0)
Lymphs Abs: 0.5 10*3/uL — ABNORMAL LOW (ref 0.7–4.0)
MCH: 27.9 pg (ref 26.0–34.0)
MCH: 28.2 pg (ref 26.0–34.0)
MCHC: 33.3 g/dL (ref 30.0–36.0)
MCHC: 33.7 g/dL (ref 30.0–36.0)
MCV: 83.6 fL (ref 80.0–100.0)
MCV: 83.6 fL (ref 80.0–100.0)
Monocytes Absolute: 1.5 10*3/uL — ABNORMAL HIGH (ref 0.1–1.0)
Monocytes Absolute: 1.6 10*3/uL — ABNORMAL HIGH (ref 0.1–1.0)
Monocytes Relative: 11 %
Monocytes Relative: 12 %
Neutro Abs: 11 10*3/uL — ABNORMAL HIGH (ref 1.7–7.7)
Neutro Abs: 12.3 10*3/uL — ABNORMAL HIGH (ref 1.7–7.7)
Neutrophils Relative %: 83 %
Neutrophils Relative %: 84 %
Platelets: 145 10*3/uL — ABNORMAL LOW (ref 150–400)
Platelets: 153 10*3/uL (ref 150–400)
RBC: 2.98 MIL/uL — ABNORMAL LOW (ref 3.87–5.11)
RBC: 3.23 MIL/uL — ABNORMAL LOW (ref 3.87–5.11)
RDW: 15.9 % — ABNORMAL HIGH (ref 11.5–15.5)
RDW: 15.9 % — ABNORMAL HIGH (ref 11.5–15.5)
WBC: 13.2 10*3/uL — ABNORMAL HIGH (ref 4.0–10.5)
WBC: 14.6 10*3/uL — ABNORMAL HIGH (ref 4.0–10.5)
nRBC: 0 % (ref 0.0–0.2)
nRBC: 0 % (ref 0.0–0.2)

## 2021-07-03 LAB — BLOOD GAS, VENOUS
Acid-base deficit: 3.6 mmol/L — ABNORMAL HIGH (ref 0.0–2.0)
Acid-base deficit: 6.8 mmol/L — ABNORMAL HIGH (ref 0.0–2.0)
Bicarbonate: 17.7 mmol/L — ABNORMAL LOW (ref 20.0–28.0)
Bicarbonate: 20.4 mmol/L (ref 20.0–28.0)
Drawn by: 164
O2 Saturation: 80.8 %
O2 Saturation: 86.8 %
Patient temperature: 37
Patient temperature: 37
pCO2, Ven: 32 mmHg — ABNORMAL LOW (ref 44–60)
pCO2, Ven: 33 mmHg — ABNORMAL LOW (ref 44–60)
pH, Ven: 7.35 (ref 7.25–7.43)
pH, Ven: 7.4 (ref 7.25–7.43)
pO2, Ven: 52 mmHg — ABNORMAL HIGH (ref 32–45)
pO2, Ven: 53 mmHg — ABNORMAL HIGH (ref 32–45)

## 2021-07-03 LAB — URINALYSIS, ROUTINE W REFLEX MICROSCOPIC
Bilirubin Urine: NEGATIVE
Glucose, UA: >=500 mg/dL — AB
Ketones, ur: 5 mg/dL — AB
Nitrite: NEGATIVE
Protein, ur: >=300 mg/dL — AB
Specific Gravity, Urine: 1.008 (ref 1.005–1.030)
WBC, UA: >50 WBC/hpf — ABNORMAL HIGH (ref 0–5)
pH: 7 (ref 5.0–8.0)

## 2021-07-03 LAB — GLUCOSE, CAPILLARY
Glucose-Capillary: 126 mg/dL — ABNORMAL HIGH (ref 70–99)
Glucose-Capillary: 124 mg/dL — ABNORMAL HIGH (ref 70–99)
Glucose-Capillary: 115 mg/dL — ABNORMAL HIGH (ref 70–99)
Glucose-Capillary: 116 mg/dL — ABNORMAL HIGH (ref 70–99)
Glucose-Capillary: 121 mg/dL — ABNORMAL HIGH (ref 70–99)
Glucose-Capillary: 111 mg/dL — ABNORMAL HIGH (ref 70–99)
Glucose-Capillary: 118 mg/dL — ABNORMAL HIGH (ref 70–99)
Glucose-Capillary: 110 mg/dL — ABNORMAL HIGH (ref 70–99)

## 2021-07-03 LAB — CBG MONITORING, ED
Glucose-Capillary: 138 mg/dL — ABNORMAL HIGH (ref 70–99)
Glucose-Capillary: 144 mg/dL — ABNORMAL HIGH (ref 70–99)
Glucose-Capillary: 179 mg/dL — ABNORMAL HIGH (ref 70–99)
Glucose-Capillary: 182 mg/dL — ABNORMAL HIGH (ref 70–99)
Glucose-Capillary: 183 mg/dL — ABNORMAL HIGH (ref 70–99)
Glucose-Capillary: 190 mg/dL — ABNORMAL HIGH (ref 70–99)
Glucose-Capillary: 257 mg/dL — ABNORMAL HIGH (ref 70–99)
Glucose-Capillary: 424 mg/dL — ABNORMAL HIGH (ref 70–99)

## 2021-07-03 LAB — BLOOD CULTURE ID PANEL (REFLEXED) - BCID2

## 2021-07-03 LAB — COMPREHENSIVE METABOLIC PANEL
ALT: 16 U/L (ref 0–44)
AST: 16 U/L (ref 15–41)
Albumin: 2.9 g/dL — ABNORMAL LOW (ref 3.5–5.0)
Alkaline Phosphatase: 57 U/L (ref 38–126)
Anion gap: 17 — ABNORMAL HIGH (ref 5–15)
BUN: 59 mg/dL — ABNORMAL HIGH (ref 6–20)
CO2: 20 mmol/L — ABNORMAL LOW (ref 22–32)
Calcium: 9.3 mg/dL (ref 8.9–10.3)
Chloride: 90 mmol/L — ABNORMAL LOW (ref 98–111)
Creatinine, Ser: 8.3 mg/dL — ABNORMAL HIGH (ref 0.44–1.00)
GFR, Estimated: 6 mL/min — ABNORMAL LOW (ref >60)
Glucose, Bld: 151 mg/dL — ABNORMAL HIGH (ref 70–99)
Potassium: 3.7 mmol/L (ref 3.5–5.1)
Sodium: 127 mmol/L — ABNORMAL LOW (ref 135–145)
Total Bilirubin: 0.5 mg/dL (ref 0.3–1.2)
Total Protein: 6.8 g/dL (ref 6.5–8.1)

## 2021-07-03 LAB — LACTIC ACID, PLASMA
Lactic Acid, Venous: 0.8 mmol/L (ref 0.5–1.9)
Lactic Acid, Venous: 0.9 mmol/L (ref 0.5–1.9)

## 2021-07-03 LAB — PHOSPHORUS: Phosphorus: 7.3 mg/dL — ABNORMAL HIGH (ref 2.5–4.6)

## 2021-07-03 LAB — C-REACTIVE PROTEIN: CRP: 33.8 mg/dL — ABNORMAL HIGH (ref <1.0)

## 2021-07-03 LAB — HEPATITIS B CORE ANTIBODY, TOTAL: Hep B Core Total Ab: NONREACTIVE

## 2021-07-03 LAB — SARS CORONAVIRUS 2 BY RT PCR: SARS Coronavirus 2 by RT PCR: NEGATIVE

## 2021-07-03 LAB — SEDIMENTATION RATE: Sed Rate: 80 mm/hr — ABNORMAL HIGH (ref 0–22)

## 2021-07-03 LAB — MAGNESIUM: Magnesium: 1.9 mg/dL (ref 1.7–2.4)

## 2021-07-03 LAB — CK: Total CK: 86 U/L (ref 38–234)

## 2021-07-03 LAB — HEPATITIS C ANTIBODY: HCV Ab: NONREACTIVE

## 2021-07-03 LAB — LIPASE, BLOOD: Lipase: 26 U/L (ref 11–51)

## 2021-07-03 LAB — HEPATITIS B SURFACE ANTIGEN: Hepatitis B Surface Ag: NONREACTIVE

## 2021-07-03 LAB — PROTIME-INR
INR: 1.1 (ref 0.8–1.2)
Prothrombin Time: 14.4 seconds (ref 11.4–15.2)

## 2021-07-03 LAB — APTT: aPTT: 39 seconds — ABNORMAL HIGH (ref 24–36)

## 2021-07-03 LAB — PROCALCITONIN: Procalcitonin: >150 ng/mL

## 2021-07-03 IMAGING — CT CT CHEST-ABD-PELV W/ CM
2 of 4 series · 15 of 36 positions shown, 17 images · IV contrast (OMNIPAQUE 300)
Comparison: Noncontrast CT Abdomen and Pelvis [DATE]. Portable
chest [DATE].

CLINICAL DATA: 30-year-old female with sepsis, DKA. End stage renal
disease on dialysis.

EXAM:
CT CHEST, ABDOMEN, AND PELVIS WITH CONTRAST
TECHNIQUE: Multidetector CT imaging of the chest, abdomen and pelvis was
performed following the standard protocol during bolus
administration of intravenous contrast.

[Series 2: cap with · axial · 0.80mm/px · z∈[+971,+1506]mm · 12 of 127 slices shown, 14 images]
[im 10/127  mediastinal]
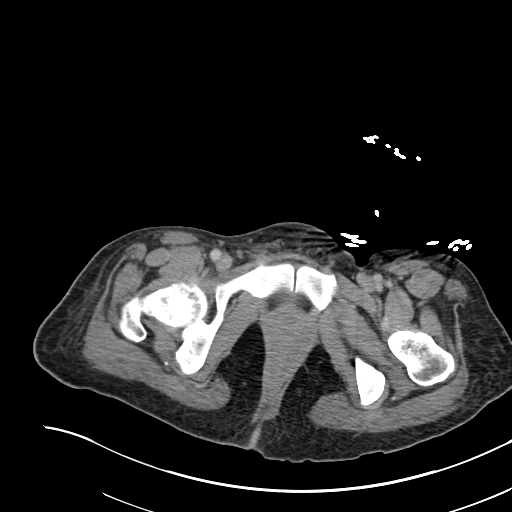
[im 10/127  bone]
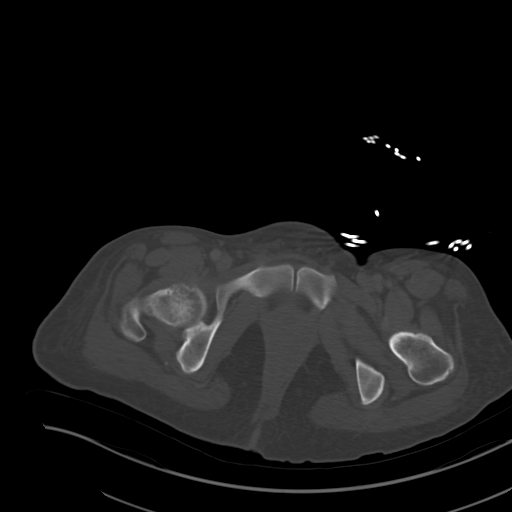
[im 20/127  mediastinal]
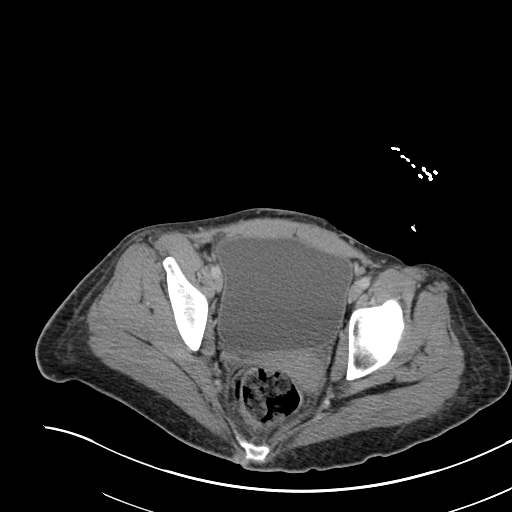
[im 30/127  mediastinal]
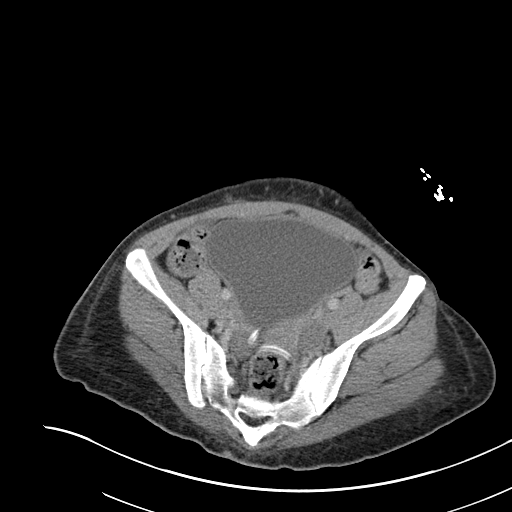
[im 39/127  mediastinal]
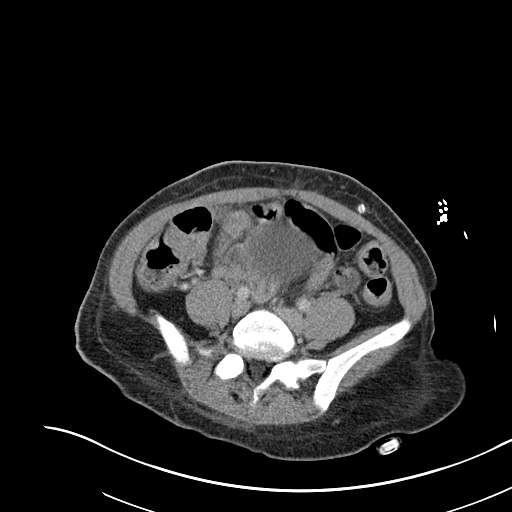
[im 49/127  mediastinal]
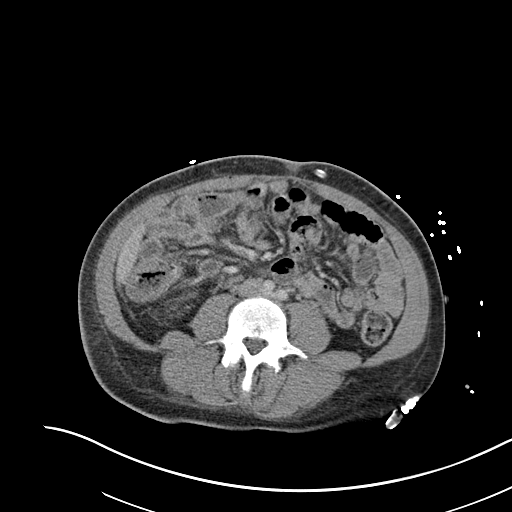
[im 59/127  mediastinal]
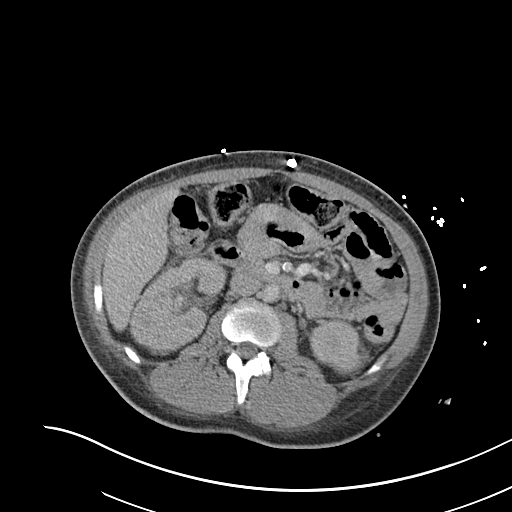
[im 68/127  mediastinal]
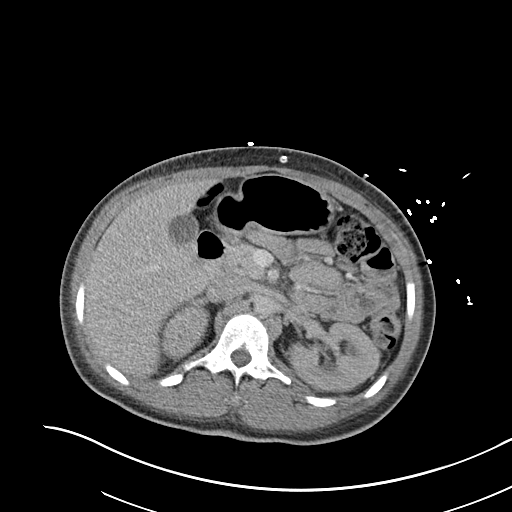
[im 78/127  mediastinal]
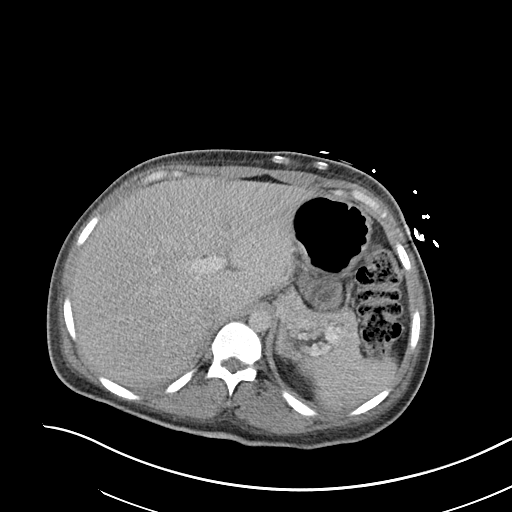
[im 88/127  mediastinal]
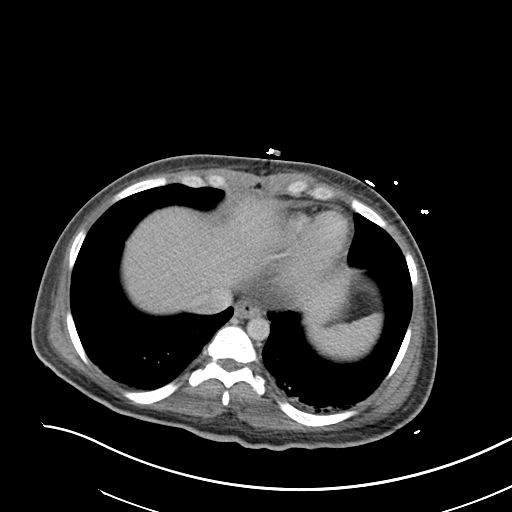
[im 88/127  bone]
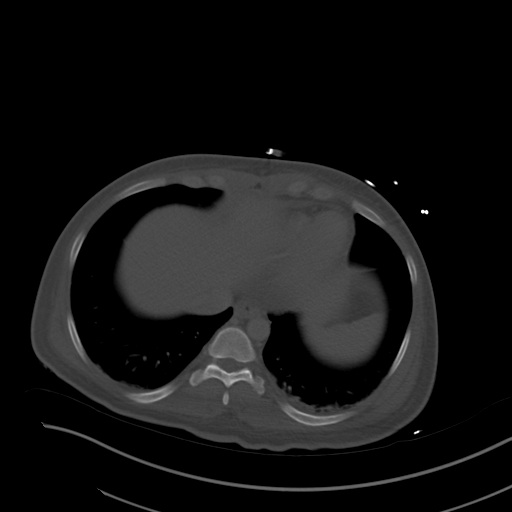
[im 97/127  mediastinal]
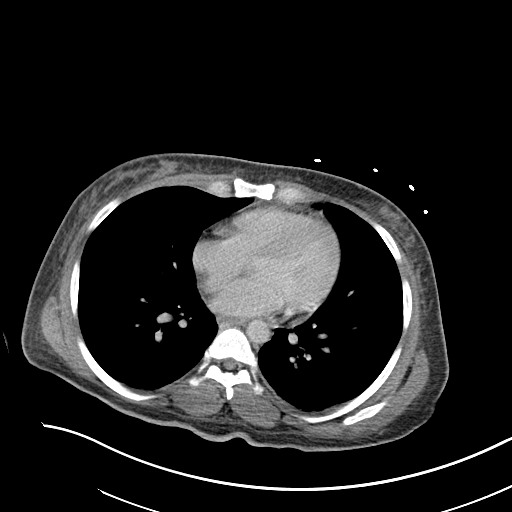
[im 107/127  mediastinal]
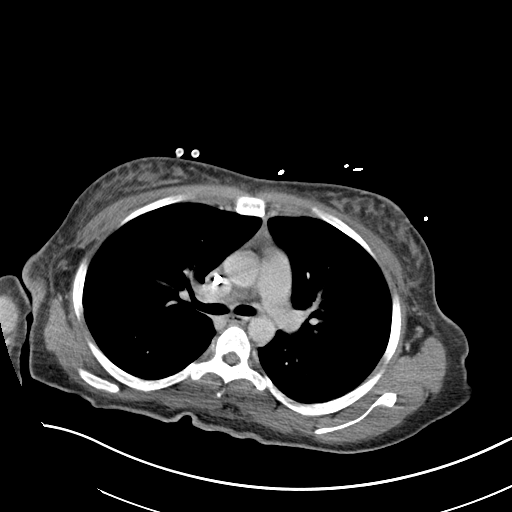
[im 117/127  mediastinal]
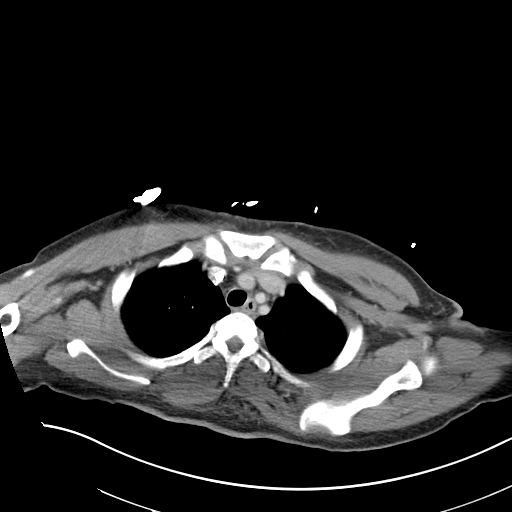

[Series 5: coronals · coronal · 0.93mm/px · 3 of 114 slices shown]
[im 23/114  mediastinal]
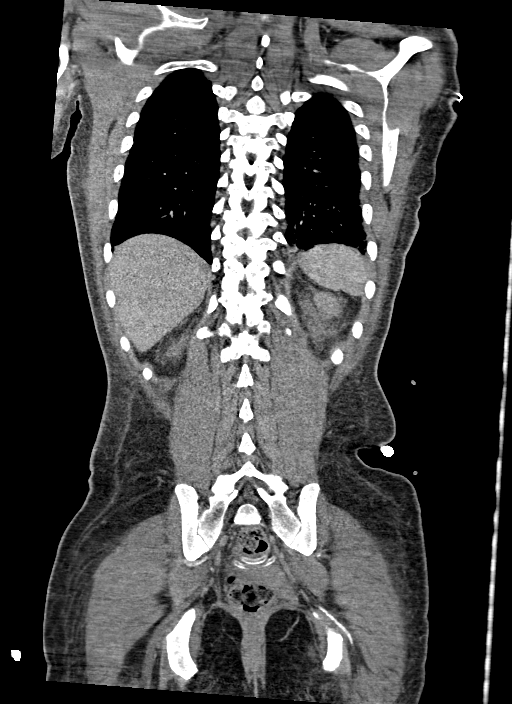
[im 46/114  mediastinal]
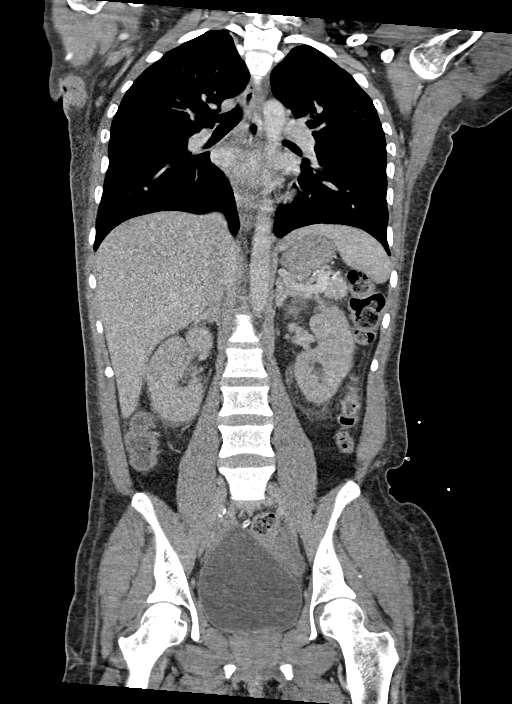
[im 68/114  mediastinal]
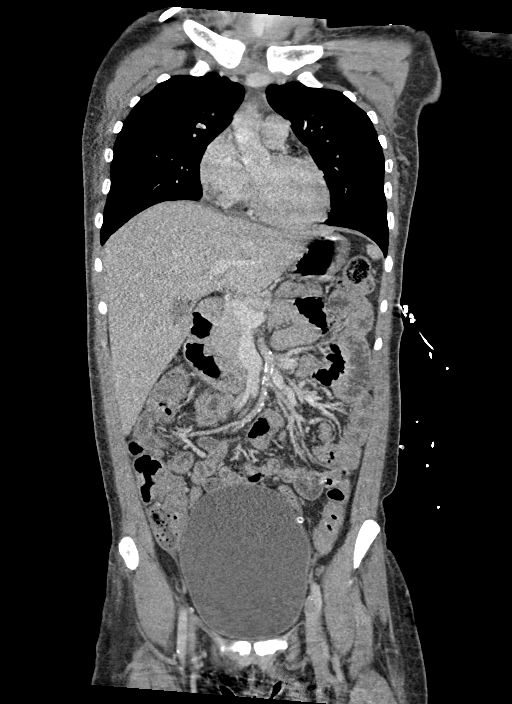

[15 of 36 positions shown; findings below may reference images not displayed]

RADIATION DOSE REDUCTION: This exam was performed according to the
departmental dose-optimization program which includes automated
exposure control, adjustment of the mA and/or kV according to
patient size and/or use of iterative reconstruction technique.

CONTRAST:  75mL OMNIPAQUE IOHEXOL 300 MG/ML  SOLN
FINDINGS: CT CHEST FINDINGS

Cardiovascular: Right IJ approach dual lumen dialysis type catheter.
No cardiomegaly or pericardial effusion. Negative thoracic aorta.

Mediastinum/Nodes: Negative.  Small volume residual thymus.

Lungs/Pleura: Major airways are patent. Mild dependent opacity in
both lungs most resembles atelectasis, with occasional mild
subpleural scarring such as series 4, image 62). No pleural
effusion.

Musculoskeletal: Negative.

CT ABDOMEN PELVIS FINDINGS

Hepatobiliary: Negative liver and gallbladder.

Pancreas: Negative.

Spleen: Negative.

Adrenals/Urinary Tract: Normal adrenal glands. Kidneys appear stable
with mild nonspecific bilateral renal or pararenal inflammation.
Symmetric renal enhancement. No convincing obstructive uropathy or
urinary calculus, with multiple pelvic phleboliths. Bladder is
distended, 640 mL estimated.

Stomach/Bowel: No dilated large or small bowel. Similar retained
stool throughout the colon. There is trace free fluid in both lower
quadrants best seen on series 5, image 40, decreased from the CT
last month. Normal appendix on coronal image 61. No free air or
discrete bowel inflammation identified.

Vascular/Lymphatic: Major arterial structures in the abdomen and
pelvis are patent with some calcified atherosclerosis, most notably
the SMA. Portal venous system is patent. No lymphadenopathy
identified.

Reproductive: Negative.

Other: No pelvic free fluid. There is a chronic peritoneal dialysis
catheter terminating in the pelvic cul-de-sac.

Musculoskeletal: Negative.
IMPRESSION: 1. No convincing acute or inflammatory process; distended urinary
bladder (640 mL).

2. Trace lower quadrant free fluid likely related to peritoneal
dialysis and decreased from last month. Right IJ approach
hemodialysis catheter also in place.

## 2021-07-03 MED ORDER — AMLODIPINE BESYLATE 5 MG PO TABS: 10.0000 mg | ORAL_TABLET | Freq: Every evening | ORAL | Status: DC

## 2021-07-03 MED ORDER — VANCOMYCIN HCL 750 MG/150ML IV SOLN: 750.0000 mg | INTRAVENOUS | Status: DC

## 2021-07-03 MED ORDER — POLYETHYLENE GLYCOL 3350 17 G PO PACK: 17.0000 g | PACK | Freq: Every day | ORAL | Status: DC | PRN

## 2021-07-03 MED ORDER — ONDANSETRON HCL 4 MG/2ML IJ SOLN: 4.0000 mg | Freq: Four times a day (QID) | INTRAMUSCULAR | Status: DC | PRN

## 2021-07-03 MED ORDER — SODIUM CHLORIDE 0.9% FLUSH
3.0000 mL | Freq: Two times a day (BID) | INTRAVENOUS | Status: DC
  Administered 2021-07-03 – 2021-07-10 (×15): 3 mL via INTRAVENOUS

## 2021-07-03 MED ORDER — FLUOXETINE HCL 20 MG PO CAPS
40.0000 mg | ORAL_CAPSULE | Freq: Every morning | ORAL | Status: DC
  Administered 2021-07-03 – 2021-07-11 (×9): 40 mg via ORAL
  Filled 2021-07-03 (×9): qty 2

## 2021-07-03 MED ORDER — CARVEDILOL 25 MG PO TABS
25.0000 mg | ORAL_TABLET | Freq: Two times a day (BID) | ORAL | Status: DC
  Administered 2021-07-03 – 2021-07-11 (×14): 25 mg via ORAL
  Filled 2021-07-03 (×3): qty 1
  Filled 2021-07-03: qty 2
  Filled 2021-07-03 (×4): qty 1
  Filled 2021-07-03: qty 2
  Filled 2021-07-03 (×5): qty 1

## 2021-07-03 MED ORDER — VANCOMYCIN HCL 1500 MG/300ML IV SOLN
1500.0000 mg | Freq: Once | INTRAVENOUS | Status: AC
Start: 2021-07-03 — End: 2021-07-03
  Administered 2021-07-03: 1500 mg via INTRAVENOUS
  Filled 2021-07-03: qty 300

## 2021-07-03 MED ORDER — SODIUM CHLORIDE 0.9 % IV SOLN
1.0000 g | INTRAVENOUS | Status: DC
  Administered 2021-07-03: 1 g via INTRAVENOUS
  Filled 2021-07-03: qty 1
  Filled 2021-07-03: qty 10

## 2021-07-03 MED ORDER — SEVELAMER CARBONATE 800 MG PO TABS
800.0000 mg | ORAL_TABLET | Freq: Three times a day (TID) | ORAL | Status: DC
  Administered 2021-07-03 – 2021-07-07 (×7): 800 mg via ORAL
  Filled 2021-07-03 (×10): qty 1

## 2021-07-03 MED ORDER — SODIUM CHLORIDE (PF) 0.9 % IJ SOLN
INTRAMUSCULAR | Status: AC
  Filled 2021-07-03: qty 50

## 2021-07-03 MED ORDER — HEPARIN SODIUM (PORCINE) 5000 UNIT/ML IJ SOLN
5000.0000 [IU] | Freq: Three times a day (TID) | INTRAMUSCULAR | Status: DC
  Administered 2021-07-03 – 2021-07-11 (×21): 5000 [IU] via SUBCUTANEOUS
  Filled 2021-07-03 (×21): qty 1

## 2021-07-03 MED ORDER — CEFAZOLIN SODIUM-DEXTROSE 1-4 GM/50ML-% IV SOLN
1.0000 g | INTRAVENOUS | Status: DC
  Administered 2021-07-03 – 2021-07-10 (×7): 1 g via INTRAVENOUS
  Filled 2021-07-03 (×7): qty 50

## 2021-07-03 MED ORDER — IOHEXOL 300 MG/ML  SOLN
100.0000 mL | Freq: Once | INTRAMUSCULAR | Status: AC | PRN
  Administered 2021-07-03: 75 mL via INTRAVENOUS

## 2021-07-03 MED ORDER — FENTANYL CITRATE PF 50 MCG/ML IJ SOSY
50.0000 ug | PREFILLED_SYRINGE | INTRAMUSCULAR | Status: DC | PRN
  Administered 2021-07-03 – 2021-07-05 (×8): 50 ug via INTRAVENOUS
  Filled 2021-07-03 (×9): qty 1

## 2021-07-03 MED ORDER — DEXTROSE 5 % IV SOLN: INTRAVENOUS | Status: DC

## 2021-07-03 MED ORDER — ONDANSETRON HCL 4 MG PO TABS: 4.0000 mg | ORAL_TABLET | Freq: Four times a day (QID) | ORAL | Status: DC | PRN

## 2021-07-03 MED ORDER — SODIUM CHLORIDE 0.9 % IV SOLN
2.0000 g | Freq: Once | INTRAVENOUS | Status: DC
  Administered 2021-07-03: 2 g via INTRAVENOUS
  Filled 2021-07-03: qty 12.5

## 2021-07-03 MED ORDER — HYDRALAZINE HCL 20 MG/ML IJ SOLN: 10.0000 mg | Freq: Four times a day (QID) | INTRAMUSCULAR | Status: DC | PRN

## 2021-07-03 MED ORDER — VANCOMYCIN HCL IN DEXTROSE 1-5 GM/200ML-% IV SOLN: 1000.0000 mg | Freq: Once | INTRAVENOUS | Status: DC

## 2021-07-03 MED ORDER — ACETAMINOPHEN 325 MG PO TABS
650.0000 mg | ORAL_TABLET | Freq: Four times a day (QID) | ORAL | Status: DC | PRN
  Administered 2021-07-03 – 2021-07-04 (×3): 650 mg via ORAL
  Filled 2021-07-03 (×3): qty 2

## 2021-07-03 MED ORDER — FERROUS SULFATE 325 (65 FE) MG PO TABS
325.0000 mg | ORAL_TABLET | Freq: Every day | ORAL | Status: DC
  Administered 2021-07-03 – 2021-07-04 (×2): 325 mg via ORAL
  Filled 2021-07-03 (×2): qty 1

## 2021-07-03 MED ORDER — CALCITRIOL 0.5 MCG PO CAPS
0.5000 ug | ORAL_CAPSULE | Freq: Every day | ORAL | Status: DC
  Administered 2021-07-03 – 2021-07-06 (×4): 0.5 ug via ORAL
  Filled 2021-07-03 (×7): qty 1

## 2021-07-03 MED ORDER — HYDRALAZINE HCL 25 MG PO TABS
50.0000 mg | ORAL_TABLET | Freq: Two times a day (BID) | ORAL | Status: DC
Start: 2021-07-03 — End: 2021-07-03
  Administered 2021-07-03 (×2): 50 mg via ORAL
  Filled 2021-07-03 (×2): qty 2

## 2021-07-03 MED ORDER — METRONIDAZOLE 500 MG/100ML IV SOLN
500.0000 mg | Freq: Two times a day (BID) | INTRAVENOUS | Status: DC
  Administered 2021-07-03: 500 mg via INTRAVENOUS
  Filled 2021-07-03: qty 100

## 2021-07-03 MED ORDER — ACETAMINOPHEN 650 MG RE SUPP: 650.0000 mg | Freq: Four times a day (QID) | RECTAL | Status: DC | PRN

## 2021-07-03 MED ORDER — DEXTROSE 50 % IV SOLN: 0.0000 mL | INTRAVENOUS | Status: DC | PRN

## 2021-07-03 MED ORDER — INSULIN REGULAR(HUMAN) IN NACL 100-0.9 UT/100ML-% IV SOLN: INTRAVENOUS | Status: DC

## 2021-07-03 MED ORDER — VANCOMYCIN VARIABLE DOSE PER UNSTABLE RENAL FUNCTION (PHARMACIST DOSING)
Status: DC
Start: 2021-07-03 — End: 2021-07-03

## 2021-07-03 MED ORDER — CHLORHEXIDINE GLUCONATE CLOTH 2 % EX PADS
6.0000 | MEDICATED_PAD | Freq: Every day | CUTANEOUS | Status: DC
  Administered 2021-07-04 – 2021-07-11 (×7): 6 via TOPICAL

## 2021-07-03 NOTE — Assessment & Plan Note (Signed)
.   Resume patients home regimen of oral antihypertensives . Titrate antihypertensive regimen as necessary to achieve adequate BP control . PRN intravenous antihypertensives for excessively elevated blood pressure   

## 2021-07-03 NOTE — ED Notes (Signed)
CBG 182 mg/dL

## 2021-07-03 NOTE — Progress Notes (Signed)
PHARMACY - PHYSICIAN COMMUNICATION CRITICAL VALUE ALERT - BLOOD CULTURE IDENTIFICATION (BCID)  Barbara Donovan is an 31 y.o. female who presented to [facility] on 07/02/2021   Assessment:  4/4 positive, BCID identified staph aureus no resistance detected  Name of physician (or Provider) Contacted: Dr. Lurline Hare  Current antibiotics: Cefepime, metronidazole, vancomycin  Changes to prescribed antibiotics recommended: Recommend change to cefazolin 1g q24h (patient is ESRD on iHD) Recommendations accepted by provider  No results found for this or any previous visit.  Cristela Felt, PharmD, BCPS Clinical Pharmacist 07/03/2021 5:50 PM

## 2021-07-03 NOTE — ED Notes (Signed)
Pt given meal tray and water 

## 2021-07-03 NOTE — Progress Notes (Signed)
[facility] following for sepsis protocol. 

## 2021-07-03 NOTE — Plan of Care (Signed)
  Problem: Education: Goal: Ability to describe self-care measures that may prevent or decrease complications (Diabetes Survival Skills Education) will improve Outcome: Not Progressing Goal: Individualized Educational Video(s) Outcome: Not Progressing   Problem: Coping: Goal: Ability to adjust to condition or change in health will improve Outcome: Not Progressing   Problem: Fluid Volume: Goal: Ability to maintain a balanced intake and output will improve Outcome: Not Progressing   Problem: Health Behavior/Discharge Planning: Goal: Ability to identify and utilize available resources and services will improve Outcome: Not Progressing Goal: Ability to manage health-related needs will improve Outcome: Not Progressing   Problem: Metabolic: Goal: Ability to maintain appropriate glucose levels will improve Outcome: Not Progressing   Problem: Nutritional: Goal: Maintenance of adequate nutrition will improve Outcome: Not Progressing Goal: Progress toward achieving an optimal weight will improve Outcome: Not Progressing   Problem: Skin Integrity: Goal: Risk for impaired skin integrity will decrease Outcome: Not Progressing   Problem: Health Behavior/Discharge Planning: Goal: Ability to manage health-related needs will improve Outcome: Not Progressing   Problem: Clinical Measurements: Goal: Ability to maintain clinical measurements within normal limits will improve Outcome: Not Progressing Goal: Will remain free from infection Outcome: Not Progressing Goal: Diagnostic test results will improve Outcome: Not Progressing Goal: Respiratory complications will improve Outcome: Not Progressing Goal: Cardiovascular complication will be avoided Outcome: Not Progressing   Problem: Nutrition: Goal: Adequate nutrition will be maintained Outcome: Not Progressing   Problem: Coping: Goal: Level of anxiety will decrease Outcome: Not Progressing

## 2021-07-03 NOTE — Assessment & Plan Note (Addendum)
.   Initially upon arrival to the emergency department patient had an extremely vague presentation of complaints of polyarthralgias and myalgias . Upon evaluation in the emergency department patient is exhibiting multiple SIRS criteria and has now exhibited a fever of 103.1 . Blood cultures ordered and obtained . Obtain procalcitonin, lactic acid . Chest x-ray unremarkable . Urinalysis ordered (patient states she still makes ample urine) . COVID-19 PCR testing pending . Patient exhibiting diffuse polyarthralgias and myalgias simultaneously.  Considering multiple diagnoses including polyarthritic septic arthritis such as disseminated gonococcal arthritis (althought patient reports no sexual activity), pseudogout/gout and rheumatologic causes . Limited administration of intravenous fluid considering ESRD . Initiation of broad-spectrum intravenous antibiotics including vancomycin, cefepime and Flagyl which can be rapidly de-escalated based on work-up results . Considering patient's extremely vague presentation I am additionally performing CT imaging of the chest abdomen and pelvis with contrast.  We will coordinate with nephrology to get patient to hemodialysis . Close clinical monitoring as patient is at high risk of rapid clinical decompensation, admitting to progressive unit at [facility]

## 2021-07-03 NOTE — Assessment & Plan Note (Addendum)
Diffuse polyarthralgias and myalgias of unknown etiology  Differential diagnosis includes polyarthritic septic arthritis (less likely to be as patient states she is not sexually active), pseudogout/gout or rheumatologic polyarthropathy  Obtaining creatine kinase, ESR, CRP, CCP, rheumatoid factor, ANA  On exam no particular joint seems to be profoundly more warm/hyperemic than the other.  No other joints seem to have a large enough effusion to tap at this time. 

## 2021-07-03 NOTE — Consult Note (Signed)
Renal Service Consult Note [facility]  Barbara Donovan 07/03/2021 Sol Blazing, MD Requesting Physician: Dr. Tyrell Antonio  Reason for Consult: ESRD pt w/ sepsis HPI: The patient is a 31 y.o. year-old w/ hx of DM1, ESRD on HD (just switched from PD 1-2 mos ago), HTN who presented to ED not feeling well w/ myalgias and arthralgias and fevers.  In ED temp was 103 F. CXR was negative and CT chest negative. Pt had some DKA also and was started on IV insulin. Asked to see for ESRD.    Pt seen in room, not feeling well, aching legs and body, fevers.   Blood cx's negative here so far.    ROS - denies CP, no joint pain, no HA, no blurry vision, no rash, no diarrhea, no nausea/ vomiting, no dysuria, no difficulty voiding   Past Medical History  Past Medical History:  Diagnosis Date   Asthma    as a child, no problems as an adult, no inhaler   Cataract    NS OU   Chronic hypertension during pregnancy, antepartum 08/19/2017   Dehydration 01/28/2018   Depression during pregnancy, antepartum 07/07/2017   6/20: Short trial of zoloft previously, reports didn't help much but also didn't give it a chance Discussed r/b/a SSRIs in pregnancy, agrees to try Zoloft again, rx sent No SI/HI/red flags   Diabetes ([place])    TYPE I. A1C 7.5% 05/31/20   Diabetic retinopathy ([place]) 06/09/2017   07/2017 with bilateral severe diabetic non-proliferative retinopathy with macular edema.   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 A2/B GDM)   Required Referrals for A1GDM or A2GDM: [x]  Diabetes Education and Testing Supplies [x]  Nutrition Cousult  For A2/B GDM or higher classes of DM [x]  Diabetes Education and Testing Supplies [x]  Nutrition Counsult [x]  Fetal ECHO after 20 weeks  [x]  Eye exam for retina evaluation - severe retinopathy 7/19  Base   Ventricular septal defect (VSD) of fetus in singleton pregnancy, antepartum 09/30/2017   May go to newborn nursery per Dr. Lenard Simmer Echo prior to discharge   Past Surgical History  Past Surgical History:  Procedure Laterality Date   25 GAUGE PARS PLANA VITRECTOMY WITH 20 GAUGE MVR PORT FOR MACULAR HOLE Left 07/20/2018   Procedure: 25 GAUGE PARS PLANA VITRECTOMY LEFT EYE;  Surgeon: Bernarda Caffey, MD;  Location: [place];  Service: Ophthalmology;  Laterality: Left;   CAPD INSERTION N/A 09/10/2020   Procedure: LAPAROSCOPIC INSERTION CONTINUOUS AMBULATORY PERITONEAL DIALYSIS  (CAPD) CATHETER;  Surgeon: Serafina Mitchell, MD;  Location: [place];  Service: Vascular;  Laterality: N/A;   CESAREAN SECTION N/A 12/26/2017   Procedure: CESAREAN SECTION;  Surgeon: Osborne Oman, MD;  Location: [place];  Service: Obstetrics;  Laterality: N/A;   EYE EXAMINATION UNDER ANESTHESIA Right 07/20/2018   Procedure: Eye Exam Under Anesthesia RIGHT EYE;  Surgeon: Bernarda Caffey, MD;  Location: [place];  Service: Ophthalmology;  Laterality: Right;   EYE SURGERY Left 07/2018   GAS INSERTION Left 07/19/2019   Procedure:  INSERTION OF GAS;  Surgeon: Bernarda Caffey, MD;  Location: [place];  Service: Ophthalmology;  Laterality: Left;  SF6   INJECTION OF SILICONE OIL Left 01/23/6061   Procedure: Injection Of Silicone Oil LEFT EYE;  Surgeon: Bernarda Caffey, MD;  Location: [place];  Service: Ophthalmology;  Laterality: Left;   IR FLUORO GUIDE CV LINE RIGHT  06/02/2020   IR US GUIDE VASC ACCESS RIGHT  06/02/2020   LASER PHOTO ABLATION Right 07/20/2018   Procedure: Laser Photo Ablation RIGHT EYE;  Surgeon: Bernarda Caffey, MD;  Location: [place];   Service: Ophthalmology;  Laterality: Right;   MEMBRANE PEEL Left 07/20/2018   Procedure: Membrane Peel LEFT EYE;  Surgeon: Bernarda Caffey, MD;  Location: [place];  Service: Ophthalmology;  Laterality: Left;   MEMBRANE PEEL Left 07/19/2019   Procedure: MEMBRANE PEEL;  Surgeon: Bernarda Caffey, MD;  Location: [place];  Service: Ophthalmology;  Laterality: Left;   MITOMYCIN C APPLICATION Bilateral 0/01/6008   Procedure: Avastin Application;  Surgeon: Bernarda Caffey, MD;  Location: [place];  Service: Ophthalmology;  Laterality: Bilateral;   PHOTOCOAGULATION WITH LASER Left 07/20/2018   Procedure: Photocoagulation With Laser LEFT EYE;  Surgeon: Bernarda Caffey, MD;  Location: [place];  Service: Ophthalmology;  Laterality: Left;   PHOTOCOAGULATION WITH LASER Left 07/19/2019   Procedure: PHOTOCOAGULATION WITH LASER;  Surgeon: Bernarda Caffey, MD;  Location: [place];  Service: Ophthalmology;  Laterality: Left;   RETINAL DETACHMENT SURGERY Left 07/20/2018   Dr. Bernarda Caffey   SILICON OIL REMOVAL Left 07/19/2019   Procedure: 25g PARS PLANA VITRECTOMY WITH SILICON OIL REMOVAL;  Surgeon: Bernarda Caffey, MD;  Location: [place];  Service: Ophthalmology;  Laterality: Left;   WISDOM TOOTH EXTRACTION     Family History  Family History  Problem Relation Age of Onset   Diabetes Mother    Aneurysm Mother 38   Seizures Mother    Diabetes Father    Cataracts Father    COPD Father    Heart attack Father    Heart disease Father    Healthy Sister    Healthy Daughter    Stroke Maternal Grandfather    Amblyopia Neg Hx    Blindness Neg Hx    Glaucoma Neg Hx    Macular degeneration Neg Hx    Retinal detachment Neg Hx    Strabismus Neg Hx    Retinitis pigmentosa Neg Hx    Colon cancer Neg Hx    Stomach cancer Neg Hx    Esophageal cancer Neg Hx    Pancreatic cancer Neg Hx    Liver disease Neg Hx    Social History  reports that she has never smoked. She has never used smokeless tobacco. She reports that she does not currently use  alcohol. She reports that she does not use drugs. Allergies No Known Allergies Home medications Prior to Admission medications   Medication Sig Start Date End Date Taking? Authorizing Provider  amLODipine (NORVASC) 10 MG tablet Take 1 tablet (10 mg total) by mouth daily. Patient taking differently: Take 10 mg by mouth every evening. 03/09/20  Yes Gonfa, Taye T, MD  calcitRIOL (ROCALTROL) 0.5 MCG capsule Take 0.5 mcg by mouth daily. 12/02/20  Yes [provider]  carvedilol (COREG) 25 MG tablet Take 1 tablet (25 mg total) by mouth 2 (two) times daily. 03/07/19  Yes Imogene Burn, PA-C  ferrous sulfate 325 (65 FE) MG tablet Take 325 mg by mouth daily. 05/12/21  Yes [provider]  FLUoxetine (PROZAC) 40  MG capsule Take 40 mg by mouth every morning. 05/15/21  Yes [provider]  hydrALAZINE (APRESOLINE) 50 MG tablet Take 1 tablet by mouth twice a day, may take 1 extra tablet by mouth daily only as needed for elevated blood pressure Patient taking differently: Take 50 mg by mouth See admin instructions. Take 50mg  by mouth twice a day, may take an additional 50mg  by mouth daily only as needed for elevated blood pressure. 04/28/21  Yes Imogene Burn, PA-C  insulin glargine (LANTUS SOLOSTAR) 100 UNIT/ML Solostar Pen Inject 20 Units into the skin every morning. 05/11/21  Yes Renato Shin, MD  insulin lispro (HUMALOG KWIKPEN) 100 UNIT/ML KwikPen 3 times a day (just before each meal) 4-3-3 units, and pen needles 4/day Patient taking differently: Inject 3-4 Units into the skin See admin instructions. 4 units with breakfast 3 units with lunch and dinner 04/21/21  Yes Renato Shin, MD  multivitamin (RENA-VIT) TABS tablet Take 1 tablet by mouth at bedtime. 03/08/20  Yes Mercy Riding, MD  sevelamer carbonate (RENVELA) 800 MG tablet Take 1 tablet (800 mg total) by mouth 3 (three) times daily with meals. 02/05/21  Yes Bonnielee Haff, MD  Continuous Blood Gluc Receiver DEVI 1 Units by  Does not apply route 4 (four) times daily -  before meals and at bedtime. May substitute for cheapest monitor with insurance 03/27/21   Scarlett Presto, MD  Continuous Blood Gluc Sensor MISC 1 each by Does not apply route as directed. Use as directed every 14 days. May dispense FreeStyle [employer] or similar. 01/27/20   Antonieta Pert, MD  Continuous Glucose Monitor Sup MISC 1 Units by Does not apply route 4 (four) times daily -  before meals and at bedtime. May substitute for cheapest option with patient insurance 03/27/21 03/27/22  Scarlett Presto, MD  glucose blood (FREESTYLE TEST STRIPS) test strip Use as instructed 02/25/21   Terrilee Croak, MD  metoCLOPramide (REGLAN) 5 MG tablet Take 1 tablet (5 mg total) by mouth 3 (three) times daily before meals. Patient not taking: Reported on 07/02/2021 02/23/21   Sherwood Gambler, MD  ondansetron (ZOFRAN) 4 MG tablet Take 1 tablet (4 mg total) by mouth every 8 (eight) hours as needed for nausea or vomiting. Patient not taking: Reported on 07/02/2021 06/02/21   Horton, Barbette Hair, MD  ondansetron (ZOFRAN-ODT) 4 MG disintegrating tablet Take 1 tablet (4 mg total) by mouth every 6 (six) hours as needed for nausea or vomiting. Patient not taking: Reported on 07/02/2021 04/07/21   Scarlett Presto, MD  promethazine (PHENERGAN) 12.5 MG tablet Take 1 tablet (12.5 mg total) by mouth every 6 (six) hours as needed for nausea or vomiting. Patient not taking: Reported on 07/02/2021 04/26/21   Riesa Pope, MD     Vitals:   07/03/21 0630 07/03/21 1017 07/03/21 1040 07/03/21 1200  BP: (!) 174/95  104/64 105/67  Pulse: (!) 120  95 91  Resp: (!) 23  19 20   Temp:  (!) 100.7 F (38.2 C)    TempSrc:  Oral    SpO2: 95%  95% 98%  Weight:      Height:       Exam Gen alert, no distress No rash, cyanosis or gangrene Sclera anicteric, throat clear  No jvd or bruits Chest clear bilat to bases, no rales/ wheezing RRR no RG Abd soft ntnd no mass or ascites +bs, LUQ PD cath  clean exit site MS no joint effusions or deformity Ext no LE or  UE edema, no wounds or ulcers Neuro is alert, Ox 3 , nf    RIJ TDC in place, clean exit site      Home meds include - amlodipine 10, calcitriol 0.5 qd, carvedilol 25 bid, ferrous sulfate, fluoxetine, hydralazine 50 bid, insulin glargine/ lispro, renavite, sevelamer 800 ac tid, metoclopramide, ondansetron, promethazine prn      OP HD: [facility]  Tu-Wed-Friday  3h 7min   63kg  350/600  3/2.5 bath  R IJ TDC   Hep 2000 - last HD 6/13, 65 > 62.8kg - Hb 10 on 6/12, tssat 18 - mircera 50 on 6/05 - no meds listed   Assessment/ Plan: Sepsis - w/ high fevers, myalgias, arthralgias. W/u in progress, on IV abx per pmd. BP's high to normal.  PD cath and HD cath do not appear grossly infected, and no abd pain so peritonitis is not likely.  DKA / T1DM - on insulin drip, serial chemistries. She is not vol depleted so doesn't need IVF"s for DKA, just needs IV insulin . If needs BS support, recommend IV dextrose at 40- 70 cc/ hr (no saline).  ESRD - on HD recently switched from PD 1-2 mos ago. Doing much better. Will plan HD today /tonight. Due to high census may not be til tomorrow am.  HTN / vol - get wts, CXR clear, CT chest w/o edema. Low Na- try for 2-3 L UFG Access - plan was in OP setting to see [facility] for removal of PD cath and placement of permanent access in the arm. Has [facility] for now.  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  MBD ckd - CCa in range, phos high at 7.3. Cont binders.       Rob Satya Buttram  MD 07/03/2021, 1:50 PM Recent Labs  Lab 07/02/21 2232 07/03/21 0024 07/03/21 0305 07/03/21 0424 07/03/21 0754 07/03/21 1242  HGB 10.7* 8.4*  --  9.0*  --   --   ALBUMIN 3.5  --   --  2.9*  --   --   CALCIUM 9.8 8.9 8.9 9.3 9.4 9.0  PHOS  --   --  7.3*  --   --   --   CREATININE 8.23* 8.37* 8.79* 8.30* 8.58* 8.77*  K 5.5* 3.6 3.4* 3.7 3.7 3.6

## 2021-07-03 NOTE — Hospital Course (Signed)
Pain primarily in thigh. Still has whole body pain. Discussed having bad infection. Was switched to HD from PD due to persistent nausea/vomiting

## 2021-07-03 NOTE — Assessment & Plan Note (Signed)
   Patient was recently transitioned from peritoneal dialysis to hemodialysis due to frequent presentations with nausea and vomiting   This transition has helped the patient's symptoms of abdominal pain nausea and vomiting a great deal  Patient typically now receives hemodialysis on Mondays, Tuesdays and Fridays.  Admitting patient to [facility] for resumption of hemodialysis while hospitalized, particularly since a CT scan with contrast has been ordered  Monitoring renal function and electrolytes with serial chemistries  Fluid restricted renal diet

## 2021-07-03 NOTE — H&P (Signed)
History and Physical    Patient: Barbara Donovan MRN: [id number] DOA: 07/02/2021  Date of Service: the patient was seen and examined on 07/03/2021  Patient coming from: Home  Chief Complaint:  Chief Complaint  Patient presents with   Back Pain    HPI:   31 year old female with past medical history of diabetes mellitus type 1 complicated by diabetic polyneuropathy and diabetic retinopathy, end-stage renal disease on hemodialysis Monday Wednesday and Fridays, hypertension and major depressive disorder presented to [facility] emergency department via EMS with complaints of bilateral lower extremity pain.  Patient explains that in the middle of the day approximately 2 days ago she began to experience pain in her extremities.  Initially, patient was quite vague in describing this pain but upon further questioning it seems that this pain is emanating from her bilateral hips, her shoulders and elbows.  Patient describes this pain is sharp in quality, severe in intensity and worse with any movement whatsoever.  Pain progressively worsened over the following 36 hours and was so severe that the patient has essentially been unable to ambulate.  Patient has developed associated generalized weakness malaise and poor appetite as a result.  Upon further questioning patient denies recent travel, sick contacts, illicit drug use, heavy alcohol use or contact with confirmed COVID-19 infection.  Of note it is worth mentioning that the patient has recently been switched from peritoneal dialysis to hemodialysis within the past month.  Since then she reports that she has had a dramatic decrease in her frequent bouts of nausea and vomiting that prompted her to present for admission multiple times in the past 6 months and was mistaken for gastroparesis (normal gastric emptying study in April).  Patient is pain continued to worsen to the point where she contacted EMS who promptly came to evaluate the patient  and brought her in to [facility] emergency department for evaluation.  Upon evaluation in the emergency department patient has been found to exhibit multiple SIRS criteria including fever of 103.1, tachycardia, tachypnea and leukocytosis.  Chest x-ray revealed no obvious evidence of pneumonia.  Patient was additionally found to be in diabetic ketoacidosis and therefore was initiated on an insulin infusion.  The hospitalist group has been called to assess the patient for admission for DKA and SIRS with possible sepsis.    Review of Systems: Review of Systems  Musculoskeletal:  Positive for joint pain and neck pain.  Neurological:  Positive for weakness.  All other systems reviewed and are negative.    Past Medical History:  Diagnosis Date   Asthma    as a child, no problems as an adult, no inhaler   Cataract    NS OU   Chronic hypertension during pregnancy, antepartum 08/19/2017   Dehydration 01/28/2018   Depression during pregnancy, antepartum 07/07/2017   6/20: Short trial of zoloft previously, reports didn't help much but also didn't give it a chance Discussed r/b/a SSRIs in pregnancy, agrees to try Zoloft again, rx sent No SI/HI/red flags   Diabetes ([place])    TYPE I. A1C 7.5% 05/31/20   Diabetic retinopathy ([place]) 06/09/2017   07/2017 with bilateral severe diabetic non-proliferative retinopathy with macular edema.   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 A2/B GDM)  Required Referrals for A1GDM or A2GDM: _1  Diabetes  Education and Testing Supplies _2  Nutrition Cousult  For A2/B GDM or higher classes of DM _3  Diabetes Education and Testing Supplies _4  Nutrition Counsult _5  Fetal ECHO after 20 weeks  _6  Eye exam for retina evaluation - severe retinopathy 7/19  Base   Ventricular septal defect (VSD) of fetus in singleton pregnancy, antepartum 09/30/2017   May go to newborn nursery per Dr. Lenard Simmer Echo prior to discharge    Past Surgical History:  Procedure Laterality Date   25 GAUGE PARS PLANA VITRECTOMY WITH 20 GAUGE MVR PORT FOR MACULAR HOLE Left 07/20/2018   Procedure: 25 [place] VITRECTOMY LEFT EYE;  Surgeon: Bernarda Caffey, MD;  Location: [place];  Service: Ophthalmology;  Laterality: Left;   CAPD INSERTION N/A 09/10/2020   Procedure: LAPAROSCOPIC INSERTION CONTINUOUS AMBULATORY PERITONEAL DIALYSIS  (CAPD) CATHETER;  Surgeon: Serafina Mitchell, MD;  Location: [place];  Service: Vascular;  Laterality: N/A;   CESAREAN SECTION N/A 12/26/2017   Procedure: CESAREAN SECTION;  Surgeon: Osborne Oman, MD;  Location: [place];  Service: Obstetrics;  Laterality: N/A;   EYE EXAMINATION UNDER ANESTHESIA Right 07/20/2018   Procedure: Eye Exam Under Anesthesia RIGHT EYE;  Surgeon: Bernarda Caffey, MD;  Location: [place];  Service: Ophthalmology;  Laterality: Right;   EYE SURGERY Left 07/2018   GAS INSERTION Left 07/19/2019   Procedure: INSERTION OF GAS;  Surgeon: Bernarda Caffey, MD;  Location: [place];  Service: Ophthalmology;  Laterality: Left;  SF6   INJECTION OF SILICONE OIL Left 02/22/9561   Procedure: Injection Of Silicone Oil LEFT EYE;  Surgeon: Bernarda Caffey, MD;  Location: [place];  Service: Ophthalmology;  Laterality: Left;   IR FLUORO GUIDE CV LINE RIGHT  06/02/2020   IR US GUIDE VASC ACCESS RIGHT  06/02/2020   LASER PHOTO ABLATION Right 07/20/2018   Procedure: Laser Photo Ablation RIGHT EYE;  Surgeon: Bernarda Caffey, MD;  Location: [place];  Service: Ophthalmology;  Laterality: Right;   MEMBRANE PEEL Left  07/20/2018   Procedure: Membrane Peel LEFT EYE;  Surgeon: Bernarda Caffey, MD;  Location: [place];  Service: Ophthalmology;  Laterality: Left;   MEMBRANE PEEL Left 07/19/2019   Procedure: MEMBRANE PEEL;  Surgeon: Bernarda Caffey, MD;  Location: [place];  Service: Ophthalmology;  Laterality: Left;   MITOMYCIN C APPLICATION Bilateral 08/24/5641   Procedure: Avastin Application;  Surgeon: Bernarda Caffey, MD;  Location: [place];  Service: Ophthalmology;  Laterality: Bilateral;   PHOTOCOAGULATION WITH LASER Left 07/20/2018   Procedure: Photocoagulation With Laser LEFT EYE;  Surgeon: Bernarda Caffey, MD;  Location: [place];  Service: Ophthalmology;  Laterality: Left;   PHOTOCOAGULATION WITH LASER Left 07/19/2019   Procedure: PHOTOCOAGULATION WITH LASER;  Surgeon: Bernarda Caffey, MD;  Location: [place];  Service: Ophthalmology;  Laterality: Left;   RETINAL DETACHMENT SURGERY Left 07/20/2018   Dr. Bernarda Caffey   SILICON OIL REMOVAL Left 07/19/2019   Procedure: 25g PARS PLANA VITRECTOMY WITH SILICON OIL REMOVAL;  Surgeon: Bernarda Caffey, MD;  Location: [place];  Service: Ophthalmology;  Laterality: Left;   WISDOM TOOTH EXTRACTION      Social History:  reports that she has never smoked. She has never used smokeless tobacco. She reports that she does not currently use alcohol.  She reports that she does not use drugs.  No Known Allergies  Family History  Problem Relation Age of Onset   Diabetes Mother    Aneurysm Mother 53   Seizures Mother    Diabetes Father    Cataracts Father    COPD Father    Heart attack Father    Heart disease Father    Healthy Sister    Healthy Daughter    Stroke Maternal Grandfather    Amblyopia Neg Hx    Blindness Neg Hx    Glaucoma Neg Hx    Macular degeneration Neg Hx    Retinal detachment Neg Hx    Strabismus Neg Hx    Retinitis pigmentosa Neg Hx    Colon cancer Neg Hx    Stomach cancer Neg Hx    Esophageal cancer Neg Hx    Pancreatic cancer Neg Hx    Liver disease Neg Hx     Prior to  Admission medications   Medication Sig Start Date End Date Taking? Authorizing Provider  amLODipine (NORVASC) 10 MG tablet Take 1 tablet (10 mg total) by mouth daily. Patient taking differently: Take 10 mg by mouth every evening. 03/09/20  Yes Gonfa, Taye T, MD  calcitRIOL (ROCALTROL) 0.5 MCG capsule Take 0.5 mcg by mouth daily. 12/02/20  Yes [provider]  carvedilol (COREG) 25 MG tablet Take 1 tablet (25 mg total) by mouth 2 (two) times daily. 03/07/19  Yes Imogene Burn, PA-C  ferrous sulfate 325 (65 FE) MG tablet Take 325 mg by mouth daily. 05/12/21  Yes [provider]  FLUoxetine (PROZAC) 40 MG capsule Take 40 mg by mouth every morning. 05/15/21  Yes [provider]  hydrALAZINE (APRESOLINE) 50 MG tablet Take 1 tablet by mouth twice a day, may take 1 extra tablet by mouth daily only as needed for elevated blood pressure Patient taking differently: Take 50 mg by mouth See admin instructions. Take 64m by mouth twice a day, may take an additional 55mby mouth daily only as needed for elevated blood pressure. 04/28/21  Yes LeImogene BurnPA-C  insulin glargine (LANTUS SOLOSTAR) 100 UNIT/ML Solostar Pen Inject 20 Units into the skin every morning. 05/11/21  Yes ElRenato ShinMD  insulin lispro (HUMALOG KWIKPEN) 100 UNIT/ML KwikPen 3 times a day (just before each meal) 4-3-3 units, and pen needles 4/day Patient taking differently: Inject 3-4 Units into the skin See admin instructions. 4 units with breakfast 3 units with lunch and dinner 04/21/21  Yes ElRenato ShinMD  multivitamin (RENA-VIT) TABS tablet Take 1 tablet by mouth at bedtime. 03/08/20  Yes GoMercy RidingMD  sevelamer carbonate (RENVELA) 800 MG tablet Take 1 tablet (800 mg total) by mouth 3 (three) times daily with meals. 02/05/21  Yes KrBonnielee HaffMD  Continuous Blood Gluc Receiver DEVI 1 Units by Does not apply route 4 (four) times daily -  before meals and at bedtime. May substitute for cheapest monitor  with insurance 03/27/21   DeScarlett PrestoMD  Continuous Blood Gluc Sensor MISC 1 each by Does not apply route as directed. Use as directed every 14 days. May dispense FreeStyle [employer] similar. 01/27/20   KcAntonieta PertMD  Continuous Glucose Monitor Sup MISC 1 Units by Does not apply route 4 (four) times daily -  before meals and at bedtime. May substitute for cheapest option with patient insurance 03/27/21 03/27/22  DeScarlett PrestoMD  glucose blood (FREESTYLE TEST STRIPS) test strip Use  as instructed 02/25/21   Terrilee Croak, MD  metoCLOPramide (REGLAN) 5 MG tablet Take 1 tablet (5 mg total) by mouth 3 (three) times daily before meals. Patient not taking: Reported on 07/02/2021 02/23/21   Sherwood Gambler, MD  ondansetron (ZOFRAN) 4 MG tablet Take 1 tablet (4 mg total) by mouth every 8 (eight) hours as needed for nausea or vomiting. Patient not taking: Reported on 07/02/2021 06/02/21   Horton, Barbette Hair, MD  ondansetron (ZOFRAN-ODT) 4 MG disintegrating tablet Take 1 tablet (4 mg total) by mouth every 6 (six) hours as needed for nausea or vomiting. Patient not taking: Reported on 07/02/2021 04/07/21   Scarlett Presto, MD  promethazine (PHENERGAN) 12.5 MG tablet Take 1 tablet (12.5 mg total) by mouth every 6 (six) hours as needed for nausea or vomiting. Patient not taking: Reported on 07/02/2021 04/26/21   Riesa Pope, MD    Physical Exam:  Vitals:   07/03/21 0530 07/03/21 0600 07/03/21 0625 07/03/21 0630  BP: (!) 168/89 (!) 176/95 (!) 176/95 (!) 174/95  Pulse: (!) 115 (!) 118 (!) 119 (!) 120  Resp: (!) 25 20 (!) 23 (!) 23  Temp:   (!) 103.1 F (39.5 C)   TempSrc:   Oral   SpO2: 92% 96% 96% 95%  Weight:      Height:        Constitutional: Lethargic but arousable and oriented x3.  Patient is in marked distress due to painful joints. Skin: no rashes, no lesions, poor skin turgor noted. Eyes: Pupils are equally reactive to light.  No evidence of scleral icterus or conjunctival pallor.   ENMT: Dry mucous membranes noted.  Posterior pharynx clear of any exudate or lesions.   Neck: Notable pain with manipulation of the neck.  Kerning and Brudzinski signs negative.  No evidence of jugular venous distension.   Respiratory: Diminished breath sounds at the bases, no wheezing, no crackles. Normal respiratory effort. No accessory muscle use.  Cardiovascular: Tachycardic rate with regular rhythm, no murmurs / rubs / gallops. No extremity edema. 2+ pedal pulses. No carotid bruits.  Chest:   Nontender without crepitus or deformity.   Back:   Nontender without crepitus or deformity. Abdomen: Abdomen is soft and nontender.  No evidence of intra-abdominal masses.  Positive bowel sounds noted in all quadrants.   Musculoskeletal: Exquisite pain with both passive and active range of motion of the bilateral hips when hip joints are isolated.  Additionally there is significant pain at the bilateral elbows and bilateral shoulders.  Left shoulder and elbow are seemingly much more painful than the right.  No joint deformity upper and lower extremities. Good ROM, no contractures. Normal muscle tone.  Neurologic: CN 2-12 grossly intact. Sensation intact.  Patient moving all 4 extremities spontaneously.  Patient is following all commands.  Patient is responsive to verbal stimuli.   Psychiatric: Patient exhibits anxious mood with labile affect.  Patient seems to possess insight as to their current situation.    Data Reviewed:  I have personally reviewed and interpreted labs, imaging.  Significant findings are:  Lipase 26 CBC revealing white blood cell count of 15.4, hemoglobin 10.7, hematocrit 31.5, platelet count of 169. Chemistry revealed sodium 127, potassium 5.5, chloride 8.8, bicarbonate 19, BUN 59, creatinine 8.23.  EKG: Personally reviewed.  Rhythm is sinus tachycardia with heart rate of 103 bpm.  No dynamic ST segment changes appreciated.  Assessment and Plan: * Sepsis without acute organ  dysfunction [facility]) Initially upon arrival to the emergency department patient  had an extremely vague presentation of complaints of polyarthralgias and myalgias Upon evaluation in the emergency department patient is exhibiting multiple SIRS criteria and has now exhibited a fever of 103.1 Blood cultures ordered and obtained Obtain procalcitonin, lactic acid Chest x-ray unremarkable Urinalysis ordered (patient states she still makes ample urine) COVID-19 PCR testing pending Patient exhibiting diffuse polyarthralgias and myalgias simultaneously.  Considering multiple diagnoses including polyarthritic septic arthritis such as disseminated gonococcal arthritis (althought patient reports no sexual activity), pseudogout/gout and rheumatologic causes Limited administration of intravenous fluid considering ESRD Initiation of broad-spectrum intravenous antibiotics including vancomycin, cefepime and Flagyl which can be rapidly de-escalated based on work-up results Considering patient's extremely vague presentation I am additionally performing CT imaging of the chest abdomen and pelvis with contrast.  We will coordinate with nephrology to get patient to hemodialysis Close clinical monitoring as patient is at high risk of rapid clinical decompensation, admitting to progressive unit at [facility]   DKA, type 1 [facility]) Patient presenting with elevated anion gap and elevated beta hydroxybutyrate all consistent with diabetic ketoacidosis in the setting of diabetes mellitus type 1 Underlying sepsis/infection is likely the precipitating event Patient's been placed on insulin infusion Gentle intravenous hydration considering patient's ESRD Performing serial chemistries and serial beta hydroxybutyrate levels Patient is currently n.p.o. with exception of ice chips and sips of water Patient will remain on insulin infusion until anion gap is closed    Polyarthropathy, multiple sites Diffuse polyarthralgias and  myalgias of unknown etiology Differential diagnosis includes polyarthritic septic arthritis (less likely to be as patient states she is not sexually active), pseudogout/gout or rheumatologic polyarthropathy Obtaining creatine kinase, ESR, CRP, CCP, rheumatoid factor, ANA On exam no particular joint seems to be profoundly more warm/hyperemic than the other.  No other joints seem to have a large enough effusion to tap at this time.  Hyperkalemia Notable mild hyperkalemia on arrival Will likely resolve with initiation of insulin infusion No evidence of EKG changes Monitoring patient on telemetry  ESRD on hemodialysis [facility]) Patient was recently transitioned from peritoneal dialysis to hemodialysis due to frequent presentations with nausea and vomiting  This transition has helped the patient's symptoms of abdominal pain nausea and vomiting a great deal Patient typically now receives hemodialysis on Mondays, Tuesdays and Fridays. Admitting patient to [facility] nephrology for resumption of hemodialysis while hospitalized, particularly since a CT scan with contrast has been ordered Monitoring renal function and electrolytes with serial chemistries Fluid restricted renal diet   Essential hypertension Resume patients home regimen of oral antihypertensives Titrate antihypertensive regimen as necessary to achieve adequate BP control PRN intravenous antihypertensives for excessively elevated blood pressure         Code Status:  Full code  code status decision has been confirmed with: patient Family Communication: deferred   Consults: Nephrology.  Will consider infectious disease consultation based on initial work-up.  Severity of Illness:  The appropriate patient status for this patient is INPATIENT. Inpatient status is judged to be reasonable and necessary in order to provide the required intensity of service to ensure the patient's safety. The patient's presenting  symptoms, physical exam findings, and initial radiographic and laboratory data in the context of their chronic comorbidities is felt to place them at high risk for further clinical deterioration. Furthermore, it is not anticipated that the patient will be medically stable for discharge from the hospital within 2 midnights of admission.   * I certify that at the point of admission it is  my clinical judgment that the patient will require inpatient hospital care spanning beyond 2 midnights from the point of admission due to high intensity of service, high risk for further deterioration and high frequency of surveillance required.*  Author:  Vernelle Emerald MD  07/03/2021 7:22 AM

## 2021-07-03 NOTE — Progress Notes (Signed)
Pharmacy Antibiotic Note  Barbara Donovan is a 31 y.o. female admitted on 07/02/2021 with  sepsis, unknown source .  History includes ESRD on hemodialysis.  Has been receiving hemodialysis on Mondays, Tuesdays and Fridays.  Pharmacy has been consulted for cefepime and vancomycin dosing.  Plan: Flagyl 500 mg IV every 12 hours per provider order Initiate Cefepime 1 g IV every 24 hours Vancomycin 1500 mg IV x 1 loading dose then Vancomycin variable dose protocol, pharmacy will check levels and redose as needed. Will monitor dialysis schedule, clinical progress, cultures and sensitivities, LOT and vancomycin levels as needed.   Height: 5\' 6"  (167.6 cm) Weight: 65.8 kg (145 lb) IBW/kg (Calculated) : 59.3  Temp (24hrs), Avg:100.7 F (38.2 C), Min:98.2 F (36.8 C), Max:103.1 F (39.5 C)  Recent Labs  Lab 07/02/21 2232 07/03/21 0024 07/03/21 0305 07/03/21 0424  WBC 15.4* 14.6*  --  13.2*  CREATININE 8.23* 8.37* 8.79* 8.30*    Estimated Creatinine Clearance: 9.3 mL/min (A) (by C-G formula based on SCr of 8.3 mg/dL (H)).    No Known Allergies  Antimicrobials this admission: 6/16 Flagyl >>  6/16 cefepime > 6/16 vancomycin >>   Dose adjustments this admission:   Microbiology results: 6/16 BCx: sent     Thank you for allowing pharmacy to be a part of this patient's care.  Royetta Asal, PharmD, BCPS Clinical Pharmacist [place] Please utilize Amion for appropriate phone number to reach the unit pharmacist ([place]) 07/03/2021 8:31 AM

## 2021-07-03 NOTE — Progress Notes (Addendum)
PROGRESS NOTE    Barbara Donovan  TDH:[id number] DOB: Barbara Donovan DOA: 07/02/2021 PCP: Timothy Lasso, MD   Brief Narrative: 31 year old with past medical history significant for diabetes type 1, complicated by diabetic polyneuropathy, retinopathy, ESRD on hemodialysis MWF, hypertension, major depressive disorder presented to the ED complaining of bilateral lower extremity pain.  She reported bilateral hips, shoulder or elbow pain.  Pain is sharp in quality.  He also reports generalized weakness and malaise.  She has recently transition from peritoneal dialysis to hemodialysis within the past month.  Evaluation in the ED she was found to have high fever temperature 103, tachycardia, tachypnea, leukocytosis.  Chest x-ray no obvious evidence of pneumonia.  He was also found to be in diabetic ketoacidosis.   Assessment & Plan:   Principal Problem:   Sepsis without acute organ dysfunction (HCC) Active Problems:   DKA, type 1 ([place])   Polyarthropathy, multiple sites   Hyperkalemia   ESRD on hemodialysis ([place])   Essential hypertension  1-SIRS; concern for Sepsis:  Undergoing work up for infection. Chest x ray unremarkable, she denies abdominal pain.  Follow Blood culture. Concern for Bacteremia in  HD patient with tunneled HD catheter/. Also has peritoneal catheter in placed. Might need to consider removing peritoneal catheter.  -Follow CT abdomen , pelvis chest.  -Continue with Vancomycin and cefepime, flagyl.  -Check UA , urine culture.  -Covid Negative.   DKA: Presents with CBG 239, gap 17, Bicarb 20  In setting of Infection.  Continue with insulin Gtt. Transition when gap close and bicarb above 20.  Low rate IV fluids due to HD> decreased rate to 50 cc.  Component of metabolic acidosis in setting renal failure and sepsis.  Lactic acid pending.   Polyarthropathy Check Respiratory panel.  Could be in setting of Fever, infection.  Agree with rheumatology work up; ANA, Cyclic  Citrul, RF CRP and ESR elevated.   Hyperkalemia:treating DKA.  Resolved.   ESRD: on HD M,W, F;  Nephrology consulted.   Hypertension: Continue with Norvasc, Coreg, Hydralazine.  Hold BP medication if hypotension.  Addendum; will hold hydralazine and norvasc. SBP soft.   Hyponatremia; in setting hyperglycemia and renal failure.   Teaching Service will takeover patient care when she arrives to [facility].   Estimated body mass index is 23.4 kg/m as calculated from the following:   Height as of this encounter: _0  (1.676 m).   Weight as of this encounter: 65.8 kg.   DVT prophylaxis: Heparin  Code Status: Full code Family Communication: Care discussed with patient.  Disposition Plan:  Status is: Inpatient Remains inpatient appropriate because: management of SIRS, DKA    Consultants:  Nephrology   Procedures:  None  Antimicrobials:  Vanc, cefepime/   Subjective: She is alert, appears weak, denies abdominal pain, nausea, vomiting, diarrhea, or cough.   Objective: Vitals:   07/03/21 0530 07/03/21 0600 07/03/21 0625 07/03/21 0630  BP: (!) 168/89 (!) 176/95 (!) 176/95 (!) 174/95  Pulse: (!) 115 (!) 118 (!) 119 (!) 120  Resp: (!) 25 20 (!) 23 (!) 23  Temp:   (!) 103.1 F (39.5 C)   TempSrc:   Oral   SpO2: 92% 96% 96% 95%  Weight:      Height:        Intake/Output Summary (Last 24 hours) at 07/03/2021 0742 Last data filed at 07/03/2021 0304 Gross per 24 hour  Intake 196.16 ml  Output --  Net 196.16 ml   [employer]  07/02/21 2032  Weight: 65.8 kg    Examination:  General exam: Appears calm and comfortable  Respiratory system: Clear to auscultation. Respiratory effort normal. Cardiovascular system: S1 & S2 heard, RRR. Gastrointestinal system: Abdomen is nondistended, soft and nontender. Peritoneal Catheter in place.  Central nervous system: Alert and oriented.  Extremities: Symmetric 5 x 5 power.   Data Reviewed: I have personally reviewed  following labs and imaging studies  CBC: Recent Labs  Lab 07/02/21 2232 07/03/21 0024 07/03/21 0424  WBC 15.4* 14.6* 13.2*  NEUTROABS  --  12.3* 11.0*  HGB 10.7* 8.4* 9.0*  HCT 31.5* 24.9* 27.0*  MCV 83.1 83.6 83.6  PLT 169 145* 962   Basic Metabolic Panel: Recent Labs  Lab 07/02/21 2232 07/03/21 0024 07/03/21 0305 07/03/21 0424  NA 127* 125* 124* 127*  K 5.5* 3.6 3.4* 3.7  CL 88* 90* 90* 90*  CO2 19* 19* 20* 20*  GLUCOSE 263* 256* 239* 151*  BUN 59* 58* 59* 59*  CREATININE 8.23* 8.37* 8.79* 8.30*  CALCIUM 9.8 8.9 8.9 9.3  MG  --   --   --  1.9  PHOS  --   --  7.3*  --    GFR: Estimated Creatinine Clearance: 9.3 mL/min (A) (by C-G formula based on SCr of 8.3 mg/dL (H)). Liver Function Tests: Recent Labs  Lab 07/02/21 2232 07/03/21 0424  AST 51* 16  ALT 20 16  ALKPHOS 70 57  BILITOT 1.3* 0.5  PROT 8.2* 6.8  ALBUMIN 3.5 2.9*   Recent Labs  Lab 07/02/21 2232  LIPASE 26   No results for input(s): "AMMONIA" in the last 168 hours. Coagulation Profile: No results for input(s): "INR", "PROTIME" in the last 168 hours. Cardiac Enzymes: Recent Labs  Lab 07/03/21 0024  CKTOTAL 86   BNP (last 3 results) No results for input(s): "PROBNP" in the last 8760 hours. HbA1C: No results for input(s): "HGBA1C" in the last 72 hours. CBG: Recent Labs  Lab 07/03/21 0256 07/03/21 0349 07/03/21 0513 07/03/21 0622 07/03/21 0733  GLUCAP 424* 179* 144* 190* 183*   Lipid Profile: No results for input(s): "CHOL", "HDL", "LDLCALC", "TRIG", "CHOLHDL", "LDLDIRECT" in the last 72 hours. Thyroid Function Tests: No results for input(s): "TSH", "T4TOTAL", "FREET4", "T3FREE", "THYROIDAB" in the last 72 hours. Anemia Panel: No results for input(s): "VITAMINB12", "FOLATE", "FERRITIN", "TIBC", "IRON", "RETICCTPCT" in the last 72 hours. Sepsis Labs: Recent Labs  Lab 07/03/21 0305  PROCALCITON >150.00    Recent Results (from the past 240 hour(s))  SARS Coronavirus 2 by RT  PCR (hospital order, performed in [facility] hospital lab) *cepheid single result test* Anterior Nasal Swab     Status: None   Collection Time: 07/03/21  6:30 AM   Specimen: Anterior Nasal Swab  Result Value Ref Range Status   SARS Coronavirus 2 by RT PCR NEGATIVE NEGATIVE Final    Comment: (NOTE) SARS-CoV-2 target nucleic acids are NOT DETECTED.  The SARS-CoV-2 RNA is generally detectable in upper and lower respiratory specimens during the acute phase of infection. The lowest concentration of SARS-CoV-2 viral copies this assay can detect is 250 copies / mL. A negative result does not preclude SARS-CoV-2 infection and should not be used as the sole basis for treatment or other patient management decisions.  A negative result may occur with improper specimen collection / handling, submission of specimen other than nasopharyngeal swab, presence of viral mutation(s) within the areas targeted by this assay, and inadequate number of viral copies (<250 copies /  mL). A negative result must be combined with clinical observations, patient history, and epidemiological information.  Fact Sheet for Patients:   https://www.patel.info/  Fact Sheet for Healthcare Providers: https://hall.com/  This test is not yet approved or  cleared by the [place] FDA and has been authorized for detection and/or diagnosis of SARS-CoV-2 by FDA under an Emergency Use Authorization (EUA).  This EUA will remain in effect (meaning this test can be used) for the duration of the COVID-19 declaration under Section 564(b)(1) of the Act, 21 U.S.C. section 360bbb-3(b)(1), unless the authorization is terminated or revoked sooner.  Performed at [facility], [place] [address] [place], [place] [address]          Radiology Studies: DG Chest 1 View  Result Date: 07/02/2021 CLINICAL DATA:  Nausea, back and arm pain.  Leg pain. EXAM: CHEST  1 VIEW  COMPARISON:  Most recent radiograph 05/28/2021 FINDINGS: Right-sided dialysis catheter tip overlies the atrial caval junction. The heart is normal in size. Lower lung volumes from prior exam. No focal airspace disease, pulmonary edema, pleural effusion or pneumothorax. No acute osseous findings. IMPRESSION: Low lung volumes without acute chest finding. Electronically Signed   By: Keith Rake M.D.   On: 07/02/2021 23:41        Scheduled Meds:  amLODipine  10 mg Oral QPM   calcitRIOL  0.5 mcg Oral Daily   carvedilol  25 mg Oral BID   ferrous sulfate  325 mg Oral Daily   FLUoxetine  40 mg Oral q morning   heparin  5,000 Units Subcutaneous Q8H   hydrALAZINE  50 mg Oral BID   sevelamer carbonate  800 mg Oral TID WC   sodium chloride flush  3 mL Intravenous Q12H   Continuous Infusions:  sodium chloride     ceFEPime (MAXIPIME) IV     dextrose 5 % and 0.45% NaCl 75 mL/hr at 07/03/21 0401   insulin 0.7 Units/hr (07/03/21 0514)   metronidazole     vancomycin       LOS: 0 days    Time spent: 35 Minutes.     Elmarie Shiley, MD [facility]   If 7PM-7AM, please contact night-coverage [email or web address]  07/03/2021, 7:42 AM

## 2021-07-03 NOTE — Progress Notes (Addendum)
BRIEF PROGRESS UPDATE  Summary: 31yo female with Type 1 DM complicated by diabetic polyneuropathy and diabetic retinopathy, ESRD on HD MWF who presented to [facility] 07/03/21 for diffuse myalgias and generalized weakness. Workup in the ED revealed a temperature of 103.1 F, tachycardia, tachypnea, and leukocytosis.  Additional labs consistent with DKA.  Patient admitted for ongoing sepsis work-up and management of DKA.  HPI Patient endorses feeling poorly started 2 days ago.  She endorses myalgia bilateral thigh and arms.  Denies any lower extremities edema, swelling or erythema.  She denies headache with but endorses neck pain that started today.  Pain is worse with movement.  Patient denies change in vision, shortness of breath, coughing, chest pain, abdominal pain, nausea, vomiting, diarrhea, constipation, vaginal discharge, dysuria or frequency.  Denies joint pain.  Denies any skin changes, rash or broken skin.   Physical exam  Physical Exam Constitutional:      General: She is in acute distress.     Appearance: She is ill-appearing.  HENT:     Head: Normocephalic.  Eyes:     General:        Right eye: No discharge.     Conjunctiva/sclera: Conjunctivae normal.  Neck:     Comments: Tenderness to palpation in the mid neck down to to the upper back.  No focal point tenderness.  Tight paraspinal and trapezius muscle.  Normal rotational range of motion.  Pain with head flexion. Cardiovascular:     Rate and Rhythm: Normal rate and regular rhythm.  Pulmonary:     Effort: Pulmonary effort is normal. No respiratory distress.     Breath sounds: Normal breath sounds.  Abdominal:     General: Bowel sounds are normal. There is no distension.     Palpations: Abdomen is soft.     Tenderness: There is no abdominal tenderness.     Comments: Peritoneal dialysis catheter site appears clean and noninfected  Musculoskeletal:        General: Tenderness (Tenderness to bilateral thigh palpation.) present.      Comments: Bilateral lower extremity nonedematous, erythematous or swollen.  Skin:    General: Skin is warm.  Neurological:     Mental Status: She is alert and oriented to person, place, and time.      Assessment/plan  SIRS Criteria met with temperature 103.1 F, tachycardia, tachypnea, WBC 15.4.  Vitals have improved once she got to [facility].  No obvious infection source has been identified.  Chest x-ray unrevealing.  No respiratory, GI or GU symptoms. COVID-negative.  No rhabdomyolysis with normal CK.  PD catheter site appears clean and noninfected.  COVID-negative. CT chest/abdomen/pelvis was unrevealing.  No pericardial effusion.  trace free fluid has decreased. -- - Continue broad-spectrum antibiotics with vancomycin, cefepime, and Flagyl - Follow blood and urine cultures - Check complete respiratory virus panel - Follow-up ANA, rheumatoid factor, CCP - Maybe consider LP if not improving.  Patient is however mentating well without headache.    DKA, type 1 diabetes mellitus Diabetic complications: Nephropathy, retinopathy, gastroparesis Exacerbated by her infection.  Last BMP still showed acidosis with gap of 20.  Lactic acid WNL . - Continue insulin drip per protocol.  Acute mild hyponatremia Could be from volume overload versus poor p.o. intake -Trend BMP  End-stage renal disease on dialysis Monday Wednesday Friday.  Thought to be due to diabetic nephropathy.  Started dialysis in May 2022. Recently transitioned from PD to hemodialysis 1 month ago. -Nephrology is following -Could not get HD tonight  while she is on insulin drip -Per nephro, plan was to remove PD with placement of permanent access outpatient.  We may need to the PD catheter if infection not controlled.  Chronic normocytic anemia -Trend CBC  Hypertension.   -Cont coreg  Anxiety/depression - Continue Prozac

## 2021-07-03 NOTE — Assessment & Plan Note (Signed)
   Notable mild hyperkalemia on arrival  Will likely resolve with initiation of insulin infusion  No evidence of EKG changes  Monitoring patient on telemetry

## 2021-07-03 NOTE — ED Notes (Signed)
Call placed to [facility] for transport at this time.

## 2021-07-03 NOTE — Inpatient Diabetes Management (Signed)
Inpatient Diabetes Program Recommendations  AACE/ADA: New Consensus Statement on Inpatient Glycemic Control   Target Ranges:  Prepandial:   less than 140 mg/dL      Peak postprandial:   less than 180 mg/dL (1-2 hours)      Critically ill patients:  140 - 180 mg/dL    Latest Reference Range & Units 07/03/21 01:52 07/03/21 02:56 07/03/21 03:49 07/03/21 05:13 07/03/21 06:22 07/03/21 07:33 07/03/21 11:00 07/03/21 14:21  Glucose-Capillary 70 - 99 mg/dL 257 (H) 424 (H) 179 (H) 144 (H) 190 (H) 183 (H) 138 (H) 126 (H)    Latest Reference Range & Units 07/03/21 12:42  CO2 22 - 32 mmol/L 17 (L)  Glucose 70 - 99 mg/dL 145 (H)  Anion gap 5 - 15  15    Latest Reference Range & Units 07/03/21 07:54  Beta-Hydroxybutyric Acid 0.05 - 0.27 mmol/L 1.37 (H)    Latest Reference Range & Units 07/02/21 22:32  CO2 22 - 32 mmol/L 19 (L)  Glucose 70 - 99 mg/dL 263 (H)  Anion gap 5 - 15  20 (H)    Latest Reference Range & Units 07/03/21 00:24  Beta-Hydroxybutyric Acid 0.05 - 0.27 mmol/L 1.43 (H)   Review of Glycemic Control  Diabetes history: DM1 (does NOT make any insulin; requires basal, correction, and carb coverage insulin) Outpatient Diabetes medications: Lantus 20 units QAM, Humalog 4 units with breakfast, 3 units with lunch and supper Current orders for Inpatient glycemic control: IV insulin  Inpatient Diabetes Program Recommendations:    Insulin: IV insulin should be continued until acidosis has completely resolved. Once acidosis has resolved and provider is ready to transition from IV to SQ insulin, please consider ordering Semglee 16 units Q24H, CBGs Q4H, Novolog 0-6 units Q4H, and Novolog 2 units TID with meals if patient eats at least 50% of meals.  NOTE: Patient has Type 1 DM and noted to have multiple hospital admissions. This is 9th admission since January 2023.  Inpatient diabetes coordinator last talked with patient on 02/24/21 during prior admission. Patient admitted on 07/03/21 with sepsis  and DKA. Patient was started on IV insulin and is currently still ordered IV insulin. Most current BEMT today at 12:42 with CO2 17, AG 15.  Inpatient diabetes team will follow along while inpatient.  Thanks, Barnie Alderman, RN, MSN, [place] Diabetes Coordinator Inpatient Diabetes Program [phone] (Team Pager from 8am to [place])

## 2021-07-03 NOTE — ED Notes (Signed)
current CBG is 144, Beta-Hydrox 0.15, Gap 17. The insulin drip is paused but D5 .45% going at 75/hr. Do I start insulin back? Cyd Silence, MD notified

## 2021-07-03 NOTE — Assessment & Plan Note (Signed)
   Patient presenting with elevated anion gap and elevated beta hydroxybutyrate all consistent with diabetic ketoacidosis in the setting of diabetes mellitus type 1  Underlying sepsis/infection is likely the precipitating event  Patient's been placed on insulin infusion  Gentle intravenous hydration considering patient's ESRD  Performing serial chemistries and serial beta hydroxybutyrate levels  Patient is currently n.p.o. with exception of ice chips and sips of water  Patient will remain on insulin infusion until anion gap is closed

## 2021-07-03 NOTE — ED Notes (Signed)
[facility] at bedside 

## 2021-07-04 ENCOUNTER — Inpatient Hospital Stay (HOSPITAL_COMMUNITY): Payer: 59

## 2021-07-04 DIAGNOSIS — B9561 Methicillin susceptible Staphylococcus aureus infection as the cause of diseases classified elsewhere: Secondary | ICD-10-CM

## 2021-07-04 DIAGNOSIS — T82898A Other specified complication of vascular prosthetic devices, implants and grafts, initial encounter: Secondary | ICD-10-CM

## 2021-07-04 DIAGNOSIS — R7881 Bacteremia: Secondary | ICD-10-CM

## 2021-07-04 DIAGNOSIS — E1022 Type 1 diabetes mellitus with diabetic chronic kidney disease: Secondary | ICD-10-CM

## 2021-07-04 DIAGNOSIS — N186 End stage renal disease: Secondary | ICD-10-CM

## 2021-07-04 DIAGNOSIS — Z992 Dependence on renal dialysis: Secondary | ICD-10-CM

## 2021-07-04 DIAGNOSIS — A4101 Sepsis due to Methicillin susceptible Staphylococcus aureus: Secondary | ICD-10-CM

## 2021-07-04 HISTORY — DX: Bacteremia: R78.81

## 2021-07-04 HISTORY — DX: Methicillin susceptible Staphylococcus aureus infection as the cause of diseases classified elsewhere: B95.61

## 2021-07-04 LAB — GLUCOSE, CAPILLARY
Glucose-Capillary: 103 mg/dL — ABNORMAL HIGH (ref 70–99)
Glucose-Capillary: 103 mg/dL — ABNORMAL HIGH (ref 70–99)
Glucose-Capillary: 105 mg/dL — ABNORMAL HIGH (ref 70–99)
Glucose-Capillary: 106 mg/dL — ABNORMAL HIGH (ref 70–99)
Glucose-Capillary: 114 mg/dL — ABNORMAL HIGH (ref 70–99)
Glucose-Capillary: 114 mg/dL — ABNORMAL HIGH (ref 70–99)
Glucose-Capillary: 115 mg/dL — ABNORMAL HIGH (ref 70–99)
Glucose-Capillary: 117 mg/dL — ABNORMAL HIGH (ref 70–99)
Glucose-Capillary: 123 mg/dL — ABNORMAL HIGH (ref 70–99)
Glucose-Capillary: 124 mg/dL — ABNORMAL HIGH (ref 70–99)
Glucose-Capillary: 124 mg/dL — ABNORMAL HIGH (ref 70–99)
Glucose-Capillary: 125 mg/dL — ABNORMAL HIGH (ref 70–99)
Glucose-Capillary: 128 mg/dL — ABNORMAL HIGH (ref 70–99)
Glucose-Capillary: 133 mg/dL — ABNORMAL HIGH (ref 70–99)
Glucose-Capillary: 94 mg/dL (ref 70–99)
Glucose-Capillary: 94 mg/dL (ref 70–99)
Glucose-Capillary: 96 mg/dL (ref 70–99)
Glucose-Capillary: 97 mg/dL (ref 70–99)

## 2021-07-04 LAB — CULTURE, OB URINE: Culture: NO GROWTH

## 2021-07-04 LAB — BASIC METABOLIC PANEL
Anion gap: 17 — ABNORMAL HIGH (ref 5–15)
Anion gap: 18 — ABNORMAL HIGH (ref 5–15)
Anion gap: 17 — ABNORMAL HIGH (ref 5–15)
Anion gap: 14 (ref 5–15)
Anion gap: 16 — ABNORMAL HIGH (ref 5–15)
Anion gap: 16 — ABNORMAL HIGH (ref 5–15)
BUN: 67 mg/dL — ABNORMAL HIGH (ref 6–20)
BUN: 70 mg/dL — ABNORMAL HIGH (ref 6–20)
BUN: 67 mg/dL — ABNORMAL HIGH (ref 6–20)
BUN: 24 mg/dL — ABNORMAL HIGH (ref 6–20)
BUN: 29 mg/dL — ABNORMAL HIGH (ref 6–20)
BUN: 33 mg/dL — ABNORMAL HIGH (ref 6–20)
CO2: 15 mmol/L — ABNORMAL LOW (ref 22–32)
CO2: 15 mmol/L — ABNORMAL LOW (ref 22–32)
CO2: 16 mmol/L — ABNORMAL LOW (ref 22–32)
CO2: 24 mmol/L (ref 22–32)
CO2: 22 mmol/L (ref 22–32)
CO2: 22 mmol/L (ref 22–32)
Calcium: 9.1 mg/dL (ref 8.9–10.3)
Calcium: 9.1 mg/dL (ref 8.9–10.3)
Calcium: 8.9 mg/dL (ref 8.9–10.3)
Calcium: 9 mg/dL (ref 8.9–10.3)
Calcium: 8.6 mg/dL — ABNORMAL LOW (ref 8.9–10.3)
Calcium: 8.4 mg/dL — ABNORMAL LOW (ref 8.9–10.3)
Chloride: 90 mmol/L — ABNORMAL LOW (ref 98–111)
Chloride: 91 mmol/L — ABNORMAL LOW (ref 98–111)
Chloride: 93 mmol/L — ABNORMAL LOW (ref 98–111)
Chloride: 94 mmol/L — ABNORMAL LOW (ref 98–111)
Chloride: 90 mmol/L — ABNORMAL LOW (ref 98–111)
Chloride: 91 mmol/L — ABNORMAL LOW (ref 98–111)
Creatinine, Ser: 9.99 mg/dL — ABNORMAL HIGH (ref 0.44–1.00)
Creatinine, Ser: 10.2 mg/dL — ABNORMAL HIGH (ref 0.44–1.00)
Creatinine, Ser: 9.6 mg/dL — ABNORMAL HIGH (ref 0.44–1.00)
Creatinine, Ser: 4.65 mg/dL — ABNORMAL HIGH (ref 0.44–1.00)
Creatinine, Ser: 5.6 mg/dL — ABNORMAL HIGH (ref 0.44–1.00)
Creatinine, Ser: 6.31 mg/dL — ABNORMAL HIGH (ref 0.44–1.00)
GFR, Estimated: 5 mL/min — ABNORMAL LOW (ref >60)
GFR, Estimated: 5 mL/min — ABNORMAL LOW (ref >60)
GFR, Estimated: 5 mL/min — ABNORMAL LOW (ref >60)
GFR, Estimated: 12 mL/min — ABNORMAL LOW (ref >60)
GFR, Estimated: 10 mL/min — ABNORMAL LOW (ref >60)
GFR, Estimated: 9 mL/min — ABNORMAL LOW (ref >60)
Glucose, Bld: 107 mg/dL — ABNORMAL HIGH (ref 70–99)
Glucose, Bld: 122 mg/dL — ABNORMAL HIGH (ref 70–99)
Glucose, Bld: 120 mg/dL — ABNORMAL HIGH (ref 70–99)
Glucose, Bld: 88 mg/dL (ref 70–99)
Glucose, Bld: 120 mg/dL — ABNORMAL HIGH (ref 70–99)
Glucose, Bld: 145 mg/dL — ABNORMAL HIGH (ref 70–99)
Potassium: 3.5 mmol/L (ref 3.5–5.1)
Potassium: 3.9 mmol/L (ref 3.5–5.1)
Potassium: 4 mmol/L (ref 3.5–5.1)
Potassium: 3.8 mmol/L (ref 3.5–5.1)
Potassium: 5.4 mmol/L — ABNORMAL HIGH (ref 3.5–5.1)
Potassium: 3.7 mmol/L (ref 3.5–5.1)
Sodium: 122 mmol/L — ABNORMAL LOW (ref 135–145)
Sodium: 124 mmol/L — ABNORMAL LOW (ref 135–145)
Sodium: 126 mmol/L — ABNORMAL LOW (ref 135–145)
Sodium: 132 mmol/L — ABNORMAL LOW (ref 135–145)
Sodium: 128 mmol/L — ABNORMAL LOW (ref 135–145)
Sodium: 129 mmol/L — ABNORMAL LOW (ref 135–145)

## 2021-07-04 LAB — CBC
HCT: 22.2 % — ABNORMAL LOW (ref 36.0–46.0)
Hemoglobin: 7.4 g/dL — ABNORMAL LOW (ref 12.0–15.0)
MCH: 27.7 pg (ref 26.0–34.0)
MCHC: 33.3 g/dL (ref 30.0–36.0)
MCV: 83.1 fL (ref 80.0–100.0)
Platelets: 167 10*3/uL (ref 150–400)
RBC: 2.67 MIL/uL — ABNORMAL LOW (ref 3.87–5.11)
RDW: 16.6 % — ABNORMAL HIGH (ref 11.5–15.5)
WBC: 12.3 10*3/uL — ABNORMAL HIGH (ref 4.0–10.5)
nRBC: 0 % (ref 0.0–0.2)

## 2021-07-04 LAB — HEPATITIS B SURFACE ANTIBODY, QUANTITATIVE: Hep B S AB Quant (Post): 16.4 m[IU]/mL (ref >9.9)

## 2021-07-04 LAB — HCG, QUANTITATIVE, PREGNANCY: hCG, Beta Chain, Quant, S: 4 m[IU]/mL (ref <5)

## 2021-07-04 MED ORDER — DEXTROSE-NACL 5-0.45 % IV SOLN: INTRAVENOUS | Status: DC

## 2021-07-04 MED ORDER — INSULIN ASPART 100 UNIT/ML IJ SOLN: 0.0000 [IU] | Freq: Three times a day (TID) | INTRAMUSCULAR | Status: DC

## 2021-07-04 MED ORDER — INSULIN GLARGINE-YFGN 100 UNIT/ML ~~LOC~~ SOLN
20.0000 [IU] | Freq: Once | SUBCUTANEOUS | Status: AC
  Administered 2021-07-04: 20 [IU] via SUBCUTANEOUS
  Filled 2021-07-04: qty 0.2

## 2021-07-04 MED ORDER — DEXTROSE 5 % IV SOLN: INTRAVENOUS | Status: DC

## 2021-07-04 MED ORDER — INSULIN GLARGINE-YFGN 100 UNIT/ML ~~LOC~~ SOLN
20.0000 [IU] | SUBCUTANEOUS | Status: DC
  Administered 2021-07-06 (×2): 20 [IU] via SUBCUTANEOUS
  Filled 2021-07-04 (×4): qty 0.2

## 2021-07-04 MED ORDER — POTASSIUM CHLORIDE CRYS ER 20 MEQ PO TBCR
40.0000 meq | EXTENDED_RELEASE_TABLET | Freq: Once | ORAL | Status: AC
  Administered 2021-07-04: 40 meq via ORAL
  Filled 2021-07-04: qty 2

## 2021-07-04 MED ORDER — DEXTROSE-NACL 5-0.45 % IV SOLN
INTRAVENOUS | Status: DC
Start: 2021-07-04 — End: 2021-07-05

## 2021-07-04 MED ORDER — DEXTROSE 10 % IV SOLN: INTRAVENOUS | Status: DC

## 2021-07-04 NOTE — Progress Notes (Signed)
HD#1 Subjective:  Overnight Events: No acute event overnight  Patient appears fatigue and diaphoretic this morning.  Endorses ongoing neck pain and now new mid to lower back pain.  Denies any change in bilateral lower extremity function.  Denies bowel or urinary incontinence.  Objective:  Vital signs in last 24 hours: Vitals:   07/04/21 1145 07/04/21 1200 07/04/21 1230 07/04/21 1300  BP: 120/80 127/80 119/74 119/72  Pulse:      Resp: (!) 21 17    Temp:      TempSrc:      SpO2:      Weight:      Height:       Supplemental O2: Room Air SpO2: 98 %   Physical Exam:  Physical Exam Constitutional:      Appearance: She is ill-appearing and diaphoretic.  HENT:     Head: Normocephalic.  Eyes:     General:        Right eye: No discharge.        Left eye: No discharge.     Conjunctiva/sclera: Conjunctivae normal.  Neck:     Comments: Right tunneled cath in place Cardiovascular:     Rate and Rhythm: Regular rhythm. Tachycardia present.     Heart sounds: No murmur heard. Pulmonary:     Effort: Pulmonary effort is normal. No respiratory distress.     Breath sounds: Normal breath sounds.  Abdominal:     Palpations: Abdomen is soft.  Skin:    General: Skin is warm.  Neurological:     Mental Status: She is alert.     Comments: 5/5 strength of bilateral upper extremities. Limited neuro exam of bilateral lower extremity due to pain.  Overall strength are symmetric.  Intact sensation  Psychiatric:        Mood and Affect: Mood normal.     Filed Weights   07/02/21 2032 07/04/21 0944  Weight: 65.8 kg 66.4 kg     Intake/Output Summary (Last 24 hours) at 07/04/2021 1332 Last data filed at 07/04/2021 0100 Gross per 24 hour  Intake 121.67 ml  Output 300 ml  Net -178.33 ml   Net IO Since Admission: 321.16 mL [07/04/21 1332]  Pertinent Labs:    Latest Ref Rng & Units 07/04/2021    6:06 AM 07/03/2021    4:24 AM 07/03/2021   12:24 AM  CBC  WBC 4.0 - 10.5 K/uL 12.3  13.2   14.6   Hemoglobin 12.0 - 15.0 g/dL 7.4  9.0  8.4   Hematocrit 36.0 - 46.0 % 22.2  27.0  24.9   Platelets 150 - 400 K/uL 167  153  145        Latest Ref Rng & Units 07/04/2021    9:33 AM 07/04/2021    6:06 AM 07/04/2021    1:55 AM  CMP  Glucose 70 - 99 mg/dL 120  122  107   BUN 6 - 20 mg/dL 67  70  67   Creatinine 0.44 - 1.00 mg/dL 9.60  10.20  9.99   Sodium 135 - 145 mmol/L 126  124  122   Potassium 3.5 - 5.1 mmol/L 4.0  3.9  3.5   Chloride 98 - 111 mmol/L 93  91  90   CO2 22 - 32 mmol/L 16  15  15    Calcium 8.9 - 10.3 mg/dL 8.9  9.1  9.1     Imaging: No results found.  Assessment/Plan:   Principal Problem:   MSSA  bacteremia Active Problems:   Essential hypertension   DKA, type 1 ([place])   ESRD on hemodialysis [facility])   Patient Summary: Barbara Donovan is a 31 y.o. with a pertinent PMH of type 1 diabetes, ESRD on HD, who presented with feeling unwell and admitted for sepsis secondary to MSSA bacteremia.   Sepsis secondary to MSSA bacteremia The source of her bacteremia is likely secondary to her right hemodialysis tunneled cath, which was placed 2 weeks ago. -Continue IV cefazolin -Vascular surgery was consulted and will remove her PD catheter and HD catheter tomorrow morning. -Repeat blood culture tomorrow -Pending TTE.  No new murmur on exam.  She will likely need a TEE as well. -Obtain CT cervical/thoracic/lumbar to rule out spinal infection.  I have spoken with nephrology who advised against gadolinium contrast.  DKA Type 1 diabetes mellitus Her anion gap remains elevated this morning at 17 with bicarb of 16.  May be there is a component of uremia with elevated BUN of 70 (baseline around 50s).  Lactic acid within normal limits.  Patient has finished her for HD section this morning.  If repeat BMP shows improvement of anion gap, will discontinue Endo tool. -Monitor BMP every 4 hours overnight to ensure resolution of DKA -Continue insulin drip per protocol.  End-stage  renal disease on dialysis Monday Wednesday Friday.  Thought to be due to diabetic nephropathy.  Started dialysis in May 2022. Recently transitioned from PD to hemodialysis 1 month ago. -Nephrology is following -HD today.  Next session will either be Monday or Tuesday with new HD catheter.  Acute moderate hyponatremia Likely secondary to free water from D5.  This should improve after dialysis. -Trend BMP  Gastroparesis Resume Reglan after discontinuing Endo tool  Chronic normocytic anemia -Trend CBC  Hypertension.   -Cont coreg  Anxiety/depression - Continue Prozac  Diet: NPO IVF: D5 1/2 NS VTE: Heparin Code: Full PT/OT recs: None, none. TOC recs:   Dispo: Anticipated discharge to Home pending treatment of MSSA bacteremia  Gaylan Gerold, DO 07/04/2021, 1:32 PM Pager: [phone]  Please contact the on call pager after 5 pm and on weekends at [phone].

## 2021-07-04 NOTE — Consult Note (Signed)
[facility]       Reason for Consult: bacteremia    Referring Physician: [facility] [employer]  Principal Problem:   MSSA bacteremia Active Problems:   Essential hypertension   DKA, type 1 ([place])   ESRD on hemodialysis ([place])    calcitRIOL  0.5 mcg Oral Daily   carvedilol  25 mg Oral BID   Chlorhexidine Gluconate Cloth  6 each Topical Q0600   ferrous sulfate  325 mg Oral Daily   FLUoxetine  40 mg Oral q morning   heparin  5,000 Units Subcutaneous Q8H   sevelamer carbonate  800 mg Oral TID WC   sodium chloride flush  3 mL Intravenous Q12H    Recommendations: Cefazolin with dialysis  TTE Repeat blood cultures Line holiday  Assessment: She has bacteremia and source likely her dialysis catheter which will be removed tomorrow.  Work up as above.  No back pain.  Will need a TEE if TTE unrevealing.   Antibiotics: Vancomycin, cefepime, metronidazole > cefazolin  HPI: Barbara Donovan is a 31 y.o. female with type 1 diabetes, ESRD on intermittent hemodialysis for about 1 month and a dialysis catheter came in with fever, chills and body aches.  Blood cultures positive in 4/4 bottles with STaph aureus and BCID with MSSA.  Also with DKA.  Main complaint is body aches.     Review of Systems:  Constitutional: positive for fevers and chills All other systems reviewed and are negative    Past Medical History:  Diagnosis Date   Asthma    as a child, no problems as an adult, no inhaler   Cataract    NS OU   Chronic hypertension during pregnancy, antepartum 08/19/2017   Dehydration 01/28/2018   Depression during pregnancy, antepartum 07/07/2017   6/20: Short trial of zoloft previously, reports didn't help much but also didn't give it a chance Discussed r/b/a SSRIs in pregnancy, agrees to try Zoloft again, rx sent No SI/HI/red flags   Diabetes ([place])    TYPE I. 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   ESRD on peritoneal dialysis ([place])    HTN (hypertension)    Hypertensive retinopathy    OU   Hypokalemia 01/22/2018   Hypomagnesemia 01/28/2018   Intractable nausea and vomiting 01/22/2018   Intrauterine growth restriction (IUGR) affecting care of mother 12/22/2017   Morbid obesity ([place])    Nephropathy, diabetic ([place]) 12/29/2017   Severe hyperemesis gravidarum 10/30/2017   Type I diabetes mellitus ([place]) 07/07/2017   Current Diabetic Medications:  Insulin  [x]  Aspirin 81 mg daily after 12 weeks (? A2/B GDM)  Required Referrals for A1GDM or A2GDM: [x]  Diabetes Education and Testing Supplies [x]  Nutrition Cousult  For A2/B GDM or higher classes of DM [x]  Diabetes Education and Testing Supplies [x]  Nutrition Counsult [x]  Fetal ECHO after 20 weeks  [x]  Eye exam for retina evaluation - severe retinopathy 7/19  Base   Ventricular septal defect (VSD) of fetus in singleton pregnancy, antepartum 09/30/2017   May go to newborn nursery per Dr. Lenard Simmer Echo prior to discharge    Social History   Tobacco Use   Smoking status: Never   Smokeless tobacco: Never  Vaping Use   Vaping Use: Never used  Substance Use Topics   Alcohol use: Not Currently   Drug use: Never    Family History  Problem Relation Age of Onset  Diabetes Mother    Aneurysm Mother 57   Seizures Mother    Diabetes Father    Cataracts Father    COPD Father    Heart attack Father    Heart disease Father    Healthy Sister    Healthy Daughter    Stroke Maternal Grandfather    Amblyopia Neg Hx    Blindness Neg Hx    Glaucoma Neg Hx    Macular degeneration Neg Hx    Retinal detachment Neg Hx    Strabismus Neg Hx    Retinitis pigmentosa Neg Hx    Colon cancer Neg Hx    Stomach cancer Neg Hx    Esophageal cancer Neg Hx    Pancreatic cancer Neg Hx    Liver disease Neg Hx     No Known Allergies  Physical Exam: Constitutional: in no apparent distress  Vitals:    07/04/21 1300 07/04/21 1334  BP: 119/72 132/74  Pulse:    Resp:    Temp:    SpO2:     EYES: anicteric Respiratory: normal respiratory effort Skin: no rash Hematologic: no cervical lad  Lab Results  Component Value Date   WBC 12.3 (H) 07/04/2021   HGB 7.4 (L) 07/04/2021   HCT 22.2 (L) 07/04/2021   MCV 83.1 07/04/2021   PLT 167 07/04/2021    Lab Results  Component Value Date   CREATININE 9.60 (H) 07/04/2021   BUN 67 (H) 07/04/2021   NA 126 (L) 07/04/2021   K 4.0 07/04/2021   CL 93 (L) 07/04/2021   CO2 16 (L) 07/04/2021    Lab Results  Component Value Date   ALT 16 07/03/2021   AST 16 07/03/2021   ALKPHOS 57 07/03/2021     Microbiology: Recent Results (from the past 240 hour(s))  Culture, blood (Routine X 2) w Reflex to ID Panel     Status: Abnormal (Preliminary result)   Collection Time: 07/03/21  3:48 AM   Specimen: BLOOD RIGHT WRIST  Result Value Ref Range Status   Specimen Description   Final    BLOOD RIGHT WRIST Performed at [facility], [place] [address], [place], [place] [address]    Special Requests   Final    BOTTLES DRAWN AEROBIC AND ANAEROBIC Blood Culture adequate volume Performed at [facility] [address], [place], [place] [address]    Culture  Setup Time   Final    GRAM POSITIVE COCCI IN CLUSTERS IN BOTH AEROBIC AND ANAEROBIC BOTTLES CRITICAL RESULT CALLED TO, READ BACK BY AND VERIFIED WITH: PHARMD GRACE BARR ON 07/03/21 @ 1750 BY DRT Performed at [facility], [place] [address] [place], [place] [address]    Culture STAPHYLOCOCCUS AUREUS (A)  Final   Report Status PENDING  Incomplete  Culture, blood (Routine X 2) w Reflex to ID Panel     Status: Abnormal (Preliminary result)   Collection Time: 07/03/21  4:15 AM   Specimen: BLOOD  Result Value Ref Range Status   Specimen Description   Final    BLOOD RIGHT ANTECUBITAL Performed at [facility] [address], [place],  [place] [address]    Special Requests   Final    BOTTLES DRAWN AEROBIC AND ANAEROBIC Blood Culture adequate volume Performed at [facility] [address], [place], [place] [address]    Culture  Setup Time   Final    GRAM POSITIVE COCCI IN CLUSTERS IN BOTH AEROBIC AND ANAEROBIC BOTTLES  CRITICAL RESULT CALLED TO, READ BACK BY AND VERIFIED WITH: PHARMD GRACE BARR ON 07/03/21 @ 1749 BY DRT    Culture (A)  Final    STAPHYLOCOCCUS AUREUS SUSCEPTIBILITIES TO FOLLOW Performed at [facility], [place] [address], [place], [place] [address]    Report Status PENDING  Incomplete  Blood Culture ID Panel (Reflexed)     Status: Abnormal   Collection Time: 07/03/21  4:15 AM  Result Value Ref Range Status   Enterococcus faecalis NOT DETECTED NOT DETECTED Final   Enterococcus Faecium NOT DETECTED NOT DETECTED Final   Listeria monocytogenes NOT DETECTED NOT DETECTED Final   Staphylococcus species DETECTED (A) NOT DETECTED Final    Comment: CRITICAL RESULT CALLED TO, READ BACK BY AND VERIFIED WITH: PHARMD GRACE BARR ON 07/03/21 @ 1749 BY DRT    Staphylococcus aureus (BCID) DETECTED (A) NOT DETECTED Final    Comment: CRITICAL RESULT CALLED TO, READ BACK BY AND VERIFIED WITH: PHARMD GRACE BARR ON 07/03/21 @ 1749 BY DRT    Staphylococcus epidermidis NOT DETECTED NOT DETECTED Final   Staphylococcus lugdunensis NOT DETECTED NOT DETECTED Final   Streptococcus species NOT DETECTED NOT DETECTED Final   Streptococcus agalactiae NOT DETECTED NOT DETECTED Final   Streptococcus pneumoniae NOT DETECTED NOT DETECTED Final   Streptococcus pyogenes NOT DETECTED NOT DETECTED Final   A.calcoaceticus-baumannii NOT DETECTED NOT DETECTED Final   Bacteroides fragilis NOT DETECTED NOT DETECTED Final   Enterobacterales NOT DETECTED NOT DETECTED Final   Enterobacter cloacae complex NOT DETECTED NOT DETECTED Final   Escherichia coli NOT DETECTED NOT DETECTED Final   Klebsiella aerogenes NOT DETECTED NOT  DETECTED Final   Klebsiella oxytoca NOT DETECTED NOT DETECTED Final   Klebsiella pneumoniae NOT DETECTED NOT DETECTED Final   Proteus species NOT DETECTED NOT DETECTED Final   Salmonella species NOT DETECTED NOT DETECTED Final   Serratia marcescens NOT DETECTED NOT DETECTED Final   Haemophilus influenzae NOT DETECTED NOT DETECTED Final   Neisseria meningitidis NOT DETECTED NOT DETECTED Final   Pseudomonas aeruginosa NOT DETECTED NOT DETECTED Final   Stenotrophomonas maltophilia NOT DETECTED NOT DETECTED Final   Candida albicans NOT DETECTED NOT DETECTED Final   Candida auris NOT DETECTED NOT DETECTED Final   Candida glabrata NOT DETECTED NOT DETECTED Final   Candida krusei NOT DETECTED NOT DETECTED Final   Candida parapsilosis NOT DETECTED NOT DETECTED Final   Candida tropicalis NOT DETECTED NOT DETECTED Final   Cryptococcus neoformans/gattii NOT DETECTED NOT DETECTED Final   Meth resistant mecA/C and MREJ NOT DETECTED NOT DETECTED Final    Comment: Performed at [facility], [address], [place], [place] [address]  SARS Coronavirus 2 by RT PCR (hospital order, performed in [facility] hospital lab) *cepheid single result test* Anterior Nasal Swab     Status: None   Collection Time: 07/03/21  6:30 AM   Specimen: Anterior Nasal Swab  Result Value Ref Range Status   SARS Coronavirus 2 by RT PCR NEGATIVE NEGATIVE Final    Comment: (NOTE) SARS-CoV-2 target nucleic acids are NOT DETECTED.  The SARS-CoV-2 RNA is generally detectable in upper and lower respiratory specimens during the acute phase of infection. The lowest concentration of SARS-CoV-2 viral copies this assay can detect is 250 copies / mL. A negative result does not preclude SARS-CoV-2 infection and should not be used as the sole basis for treatment or other patient management decisions.  A negative result may occur with improper specimen collection / handling, submission of  specimen other than nasopharyngeal swab,  presence of viral mutation(s) within the areas targeted by this assay, and inadequate number of viral copies (<250 copies / mL). A negative result must be combined with clinical observations, patient history, and epidemiological information.  Fact Sheet for Patients:   https://www.patel.info/  Fact Sheet for Healthcare Providers: https://hall.com/  This test is not yet approved or  cleared by the [place] FDA and has been authorized for detection and/or diagnosis of SARS-CoV-2 by FDA under an Emergency Use Authorization (EUA).  This EUA will remain in effect (meaning this test can be used) for the duration of the COVID-19 declaration under Section 564(b)(1) of the Act, 21 U.S.C. section 360bbb-3(b)(1), unless the authorization is terminated or revoked sooner.  Performed at [facility], [place] [address] [place], [place] [address]   Culture, [place] Urine     Status: None   Collection Time: 07/03/21 11:18 AM   Specimen: Urine, Random  Result Value Ref Range Status   Specimen Description   Final    URINE, RANDOM Performed at [facility] [address] [place], [place] [address]    Special Requests   Final    NONE Performed at [facility], [place] [address], [place], [place] [address]    Culture   Final    NO GROWTH NO GROUP B STREP (S.AGALACTIAE) ISOLATED Performed at [facility], [place] [address], [place], [place] [address]    Report Status 07/04/2021 FINAL  Final    Thayer Headings, [place] [facility] Infectious Disease [facility] [email or web address] 07/04/2021, 2:06 PM

## 2021-07-04 NOTE — Progress Notes (Signed)
[facility] Progress Note  Subjective: blood cx's were 4/4 + for staph aureus. Pt still feeling poorly , remains on IV insulin. Getting HD in room this am.   Vitals:   07/04/21 1230 07/04/21 1300 07/04/21 1334 07/04/21 1415  BP: 119/74 119/72 132/74 131/87  Pulse:    95  Resp:    17  Temp:    (!) 100.8 F (38.2 C)  TempSrc:    Oral  SpO2:    96%  Weight:      Height:        Exam: Gen alert, no distress No jvd or bruits Chest clear bilat to bases RRR no RG Abd soft ntnd no mass or ascites +bs, LUQ PD cath clean exit site Ext no LE edema Neuro is alert, Ox 3 , nf    RIJ TDC in place, clean exit          Home meds include - amlodipine 10, calcitriol 0.5 qd, carvedilol 25 bid, ferrous sulfate, fluoxetine, hydralazine 50 bid, insulin glargine/ lispro, renavite, sevelamer 800 ac tid, metoclopramide, ondansetron, promethazine prn         OP HD: [facility] TOC  Tu-Wed-Friday  3h 82min   63kg  350/600  3/2.5 bath  R IJ TDC   Hep 2000 - last HD 6/13, 65 > 62.8kg - Hb 10 on 6/12, tssat 18 - mircera 50 on 6/05 - no meds listed     Assessment/ Plan: MSSA bacteremia - w/ high fevers, myalgias, arthralgias. Will need HD cath (only 3 wks old) removal. VVS planning to remove both the [facility] (due to infection) and also the PD cath (no longer needed) tomorrow in the OR.  Appreciate assistance.  DKA / T1DM - on insulin drip, serial chemistries. Getting IV D5W at 40 cc/hr (I dc'd the D51/2NS at 40 cc/hr).  ESRD - on HD recently switched from PD 1-2 mos ago. Doing much better since off of PD, gaining weight, recurrent N/V improved. Now has blood infection, will need TDC removal/replacement. Appreciate VVS assistance.  HTN / vol - get wts, CXR clear, CT chest w/o edema. 2.5 L off w/ HD today.  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  MBD ckd - CCa in range, phos high at 7.3. Cont binders.    Rob Barbie Croston 07/04/2021, 3:23 PM      Recent Labs  Lab  07/02/21 2232 07/03/21 0024 07/03/21 0305 07/03/21 0424 07/03/21 0754 07/04/21 0606 07/04/21 0933 07/04/21 1435  HGB 10.7*   < >  --  9.0*  --  7.4*  --   --   ALBUMIN 3.5  --   --  2.9*  --   --   --   --   CALCIUM 9.8   < > 8.9 9.3   < > 9.1 8.9 9.0  PHOS  --   --  7.3*  --   --   --   --   --   CREATININE 8.23*   < > 8.79* 8.30*   < > 10.20* 9.60* 4.65*  K 5.5*   < > 3.4* 3.7   < > 3.9 4.0 3.8   < > = values in this interval not displayed.   Inpatient medications:  calcitRIOL  0.5 mcg Oral Daily   carvedilol  25 mg Oral BID   Chlorhexidine Gluconate Cloth  6 each Topical Q0600   ferrous sulfate  325 mg Oral Daily   FLUoxetine  40  mg Oral q morning   heparin  5,000 Units Subcutaneous Q8H   sevelamer carbonate  800 mg Oral TID WC   sodium chloride flush  3 mL Intravenous Q12H     ceFAZolin (ANCEF) IV 1 g (07/03/21 2024)   dextrose 5 % and 0.45% NaCl 40 mL/hr at 07/04/21 0850   insulin 0.1 Units/hr (07/04/21 1441)   acetaminophen **OR** acetaminophen, dextrose, fentaNYL (SUBLIMAZE) injection, hydrALAZINE, ondansetron **OR** ondansetron (ZOFRAN) IV, polyethylene glycol

## 2021-07-04 NOTE — Progress Notes (Signed)
Bladder scan showed 393 in bladder- pt was able to void 300 on [facility]- stated not having the urge to void.

## 2021-07-04 NOTE — Consult Note (Signed)
[facility] [place]  ASSESSMENT / PLAN: 31 y.o. female with ESRD dialyzing via RIJ tunneled dialysis catheter. She has recently transitioned from PD to HD. She has staph aureus bacteremia. I suspect her tunneled line is the source. Will plan to remove both the tunneled line and the peritoneal dialysis catheter tomorrow in the OR. NPO at midnight. Orders for consent written.   CHIEF COMPLAINT: fever and aching all over  HISTORY OF PRESENT ILLNESS: Barbara Donovan is a 31 y.o. female disease on hemodialysis through right IJ tunneled dialysis catheter who was admitted to the internal medicine service for treatment of Staph aureus bacteremia.  The patient reports several day history of high fevers and chills with malaise.  She presented to [facility] ER where work-up was initiated and bacteremia identified.  She has recently transitioned from peritoneal dialysis to hemodialysis through a right IJ tunneled dialysis catheter.  It was recommended she stop peritoneal dialysis because of chronic nausea and vomiting.  Past Medical History:  Diagnosis Date   Asthma    as a child, no problems as an adult, no inhaler   Cataract    NS OU   Chronic hypertension during pregnancy, antepartum 08/19/2017   Dehydration 01/28/2018   Depression during pregnancy, antepartum 07/07/2017   6/20: Short trial of zoloft previously, reports didn't help much but also didn't give it a chance Discussed r/b/a SSRIs in pregnancy, agrees to try Zoloft again, rx sent No SI/HI/red flags   Diabetes ()    TYPE I. 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 A2/B GDM)  Required Referrals for A1GDM or A2GDM: [x]  Diabetes Education and Testing Supplies [x]  Nutrition Cousult  For A2/B GDM or higher classes of DM [x]  Diabetes Education and Testing Supplies [x]  Nutrition Counsult [x]  Fetal ECHO after 20 weeks  [x]  Eye exam for retina evaluation - severe retinopathy 7/19  Base   Ventricular septal defect (VSD) of fetus in singleton pregnancy, antepartum 09/30/2017   May go to newborn nursery per Dr. Lenard Simmer Echo prior to discharge    Past Surgical History:  Procedure Laterality Date   25 GAUGE PARS PLANA VITRECTOMY WITH 20 GAUGE MVR PORT FOR MACULAR HOLE Left 07/20/2018   Procedure: 25 [place] VITRECTOMY LEFT EYE;  Surgeon: Bernarda Caffey, MD;  Location: [place];  Service: Ophthalmology;  Laterality: Left;   CAPD INSERTION N/A 09/10/2020   Procedure: LAPAROSCOPIC INSERTION CONTINUOUS AMBULATORY PERITONEAL DIALYSIS  (CAPD) CATHETER;  Surgeon: Serafina Mitchell, MD;  Location: [place];  Service: Vascular;  Laterality: N/A;   CESAREAN SECTION N/A 12/26/2017   Procedure: CESAREAN SECTION;  Surgeon: Osborne Oman, MD;  Location: [place];  Service: Obstetrics;  Laterality: N/A;   EYE EXAMINATION UNDER ANESTHESIA Right 07/20/2018   Procedure: Eye Exam Under Anesthesia RIGHT EYE;  Surgeon: Bernarda Caffey, MD;  Location: [place];  Service: Ophthalmology;  Laterality: Right;   EYE SURGERY Left 07/2018   GAS INSERTION Left 07/19/2019   Procedure: INSERTION OF GAS;  Surgeon: Bernarda Caffey, MD;  Location: [place];  Service: Ophthalmology;  Laterality: Left;  SF6   INJECTION OF SILICONE OIL Left 08/22/1658   Procedure: Injection Of Silicone Oil LEFT EYE;  Surgeon: Bernarda Caffey, MD;  Location: [place];   Service: Ophthalmology;  Laterality: Left;   IR FLUORO GUIDE CV LINE RIGHT  06/02/2020   IR US GUIDE VASC ACCESS RIGHT  06/02/2020   LASER PHOTO ABLATION Right 07/20/2018   Procedure: Laser Photo Ablation RIGHT EYE;  Surgeon: Bernarda Caffey, MD;  Location: [place];  Service: Ophthalmology;  Laterality: Right;   MEMBRANE PEEL Left 07/20/2018   Procedure: Membrane Peel LEFT EYE;  Surgeon: Bernarda Caffey, MD;  Location: [place];  Service: Ophthalmology;  Laterality: Left;   MEMBRANE PEEL Left 07/19/2019   Procedure: MEMBRANE PEEL;  Surgeon: Bernarda Caffey, MD;  Location: [place];  Service: Ophthalmology;  Laterality: Left;   MITOMYCIN C APPLICATION Bilateral 06/21/158   Procedure: Avastin Application;  Surgeon: Bernarda Caffey, MD;  Location: [place];  Service: Ophthalmology;  Laterality: Bilateral;   PHOTOCOAGULATION WITH LASER Left 07/20/2018   Procedure: Photocoagulation With Laser LEFT EYE;  Surgeon: Bernarda Caffey, MD;  Location: [place];  Service: Ophthalmology;  Laterality: Left;   PHOTOCOAGULATION WITH LASER Left 07/19/2019   Procedure: PHOTOCOAGULATION WITH LASER;  Surgeon: Bernarda Caffey, MD;  Location: [place];  Service: Ophthalmology;  Laterality: Left;   RETINAL DETACHMENT SURGERY Left 07/20/2018   Dr. Bernarda Caffey   SILICON OIL REMOVAL Left 07/19/2019   Procedure: 25g PARS PLANA VITRECTOMY WITH SILICON OIL REMOVAL;  Surgeon: Bernarda Caffey, MD;  Location: [place];  Service: Ophthalmology;  Laterality: Left;   WISDOM TOOTH EXTRACTION      Family History  Problem Relation Age of Onset   Diabetes Mother    Aneurysm Mother 24   Seizures Mother    Diabetes Father    Cataracts Father    COPD Father    Heart attack Father    Heart disease Father    Healthy Sister    Healthy Daughter    Stroke Maternal Grandfather    Amblyopia Neg Hx    Blindness Neg Hx    Glaucoma Neg Hx    Macular degeneration Neg Hx    Retinal detachment Neg Hx    Strabismus Neg Hx    Retinitis pigmentosa Neg Hx    Colon cancer Neg Hx     Stomach cancer Neg Hx    Esophageal cancer Neg Hx    Pancreatic cancer Neg Hx    Liver disease Neg Hx     Social History   Socioeconomic History   Marital status: Significant Other    Spouse name: Not on file   Number of children: Not on file   Years of education: Not on file   Highest education level: Not on file  Occupational History   Not on file  Tobacco Use   Smoking status: Never   Smokeless tobacco: Never  Vaping Use   Vaping Use: Never used  Substance and Sexual Activity   Alcohol use: Not Currently   Drug use: Never   Sexual activity: Not Currently    Birth control/protection: None  Other Topics Concern   Not on file  Social History Narrative   Not on file   Social Determinants of Health   Financial Resource Strain: Not on file  Food  Insecurity: No Food Insecurity (03/22/2019)   Hunger Vital Sign    Worried About Running Out of Food in the Last Year: Never true    Ran Out of Food in the Last Year: Never true  Transportation Needs: No Transportation Needs (03/22/2019)   PRAPARE - Hydrologist (Medical): No    Lack of Transportation (Non-Medical): No  Physical Activity: Inactive (03/22/2019)   Exercise Vital Sign    Days of Exercise per Week: 0 days    Minutes of Exercise per Session: 0 min  Stress: No Stress Concern Present (03/22/2019)   [employer] [place]    Feeling of Stress : Not at all  Social Connections: Not on file  Intimate Partner Violence: Not on file    No Known Allergies  Current Facility-Administered Medications  Medication Dose Route Frequency Provider Last Rate Last Admin   acetaminophen (TYLENOL) tablet 650 mg  650 mg Oral Q6H PRN Vernelle Emerald, MD   650 mg at 07/03/21 2104   Or   acetaminophen (TYLENOL) suppository 650 mg  650 mg Rectal Q6H PRN Vernelle Emerald, MD       calcitRIOL (ROCALTROL) capsule 0.5 mcg  0.5 mcg Oral Daily Shalhoub, Sherryll Burger,  MD   0.5 mcg at 07/03/21 1040   carvedilol (COREG) tablet 25 mg  25 mg Oral BID Vernelle Emerald, MD   25 mg at 07/03/21 2104   ceFAZolin (ANCEF) IVPB 1 g/50 mL premix  1 g Intravenous Q24H Charise Killian, MD 100 mL/hr at 07/03/21 2024 1 g at 07/03/21 2024   Chlorhexidine Gluconate Cloth 2 % PADS 6 each  6 each Topical Q0600 Roney Jaffe, MD   6 each at 07/04/21 0506   dextrose 5 %-0.45 % sodium chloride infusion   Intravenous Continuous France Ravens, MD 40 mL/hr at 07/04/21 0850 New Bag at 07/04/21 0850   dextrose 50 % solution 0-50 mL  0-50 mL Intravenous PRN Shalhoub, Sherryll Burger, MD       fentaNYL (SUBLIMAZE) injection 50 mcg  50 mcg Intravenous Q2H PRN Vernelle Emerald, MD   50 mcg at 07/04/21 1112   ferrous sulfate tablet 325 mg  325 mg Oral Daily Shalhoub, Sherryll Burger, MD   325 mg at 07/03/21 1041   FLUoxetine (PROZAC) capsule 40 mg  40 mg Oral q morning Shalhoub, Sherryll Burger, MD   40 mg at 07/03/21 1040   heparin injection 5,000 Units  5,000 Units Subcutaneous Q8H Shalhoub, Sherryll Burger, MD   5,000 Units at 07/04/21 0506   hydrALAZINE (APRESOLINE) injection 10 mg  10 mg Intravenous Q6H PRN Shalhoub, Sherryll Burger, MD       insulin regular, human (MYXREDLIN) 100 units/ 100 mL infusion   Intravenous Continuous Shalhoub, Sherryll Burger, MD   Paused at 07/04/21 1020   ondansetron (ZOFRAN) tablet 4 mg  4 mg Oral Q6H PRN Shalhoub, Sherryll Burger, MD       Or   ondansetron (ZOFRAN) injection 4 mg  4 mg Intravenous Q6H PRN Shalhoub, Sherryll Burger, MD       polyethylene glycol (MIRALAX / GLYCOLAX) packet 17 g  17 g Oral Daily PRN Shalhoub, Sherryll Burger, MD       sevelamer carbonate (RENVELA) tablet 800 mg  800 mg Oral TID WC Shalhoub, Sherryll Burger, MD   800 mg at 07/03/21 1040   sodium chloride flush (NS) 0.9 % injection 3 mL  3 mL Intravenous Q12H  Vernelle Emerald, MD   3 mL at 07/03/21 1041    PHYSICAL EXAM Vitals:   07/04/21 1000 07/04/21 1015 07/04/21 1030 07/04/21 1040  BP: 123/74  (!) 88/66 128/75  Pulse:      Resp: (!) 22  (!) 24 17   Temp:      TempSrc:      SpO2:      Weight:      Height:        Constitutional: Ill-appearing young woman.   Cardiac: Regular rate and rhythm Respiratory: Tachypnea. No accessory muscle use. Abdominal: soft, non-tender, non-distended.  PD catheter in place. Peripheral vascular: Tunneled dialysis catheter in place and being used for dialysis on my evaluation.  No external signs of infection.  PERTINENT LABORATORY AND RADIOLOGIC DATA  Most recent CBC    Latest Ref Rng & Units 07/04/2021    6:06 AM 07/03/2021    4:24 AM 07/03/2021   12:24 AM  CBC  WBC 4.0 - 10.5 K/uL 12.3  13.2  14.6   Hemoglobin 12.0 - 15.0 g/dL 7.4  9.0  8.4   Hematocrit 36.0 - 46.0 % 22.2  27.0  24.9   Platelets 150 - 400 K/uL 167  153  145      Most recent CMP    Latest Ref Rng & Units 07/04/2021    9:33 AM 07/04/2021    6:06 AM 07/04/2021    1:55 AM  CMP  Glucose 70 - 99 mg/dL 120  122  107   BUN 6 - 20 mg/dL 67  70  67   Creatinine 0.44 - 1.00 mg/dL 9.60  10.20  9.99   Sodium 135 - 145 mmol/L 126  124  122   Potassium 3.5 - 5.1 mmol/L 4.0  3.9  3.5   Chloride 98 - 111 mmol/L 93  91  90   CO2 22 - 32 mmol/L 16  15  15    Calcium 8.9 - 10.3 mg/dL 8.9  9.1  9.1     Renal function Estimated Creatinine Clearance: 8 mL/min (A) (by C-G formula based on SCr of 9.6 mg/dL (H)).  Hgb A1c MFr Bld (%)  Date Value  03/25/2021 7.6 (H)   Initial blood cultures show Staph aureus without clear susceptibility data yet.  Yevonne Aline. Stanford Breed, MD [facility] Phone Number: [phone] 07/04/2021 11:17 AM  Total time spent on preparing this encounter including chart review, data review, collecting history, examining the patient, coordinating care for this new patient, 60 minutes.  Portions of this report may have been transcribed using voice recognition software.  Every effort has been made to ensure accuracy; however, inadvertent computerized transcription errors  may still be present.

## 2021-07-04 NOTE — Progress Notes (Signed)
Received patient in bed, alert and oriented. Informed consent signed and in chart.  Time tx initiated:0940  Pre HD weight:66.4kg  Pre HD VS:123/74  Time tx completed:1320  HD treatment completed. Patient tolerated well. Fistula/Graft/HD catheter without signs and symptoms of complications. Patient transported back to the room, alert and orient and in no acute distress. Report given to bedside RN.  Total UF removed:2500  Medication given:none  Post HD VS:132/74  Post HD weight: 64kg

## 2021-07-04 NOTE — Progress Notes (Signed)
   07/04/21 1926  Assess: MEWS Score  Temp (!) 101.7 F (38.7 C)  ECG Heart Rate (!) 112  Resp 14  Level of Consciousness Alert  Assess: MEWS Score  MEWS Temp 2 (Simultaneous filing. User may not have seen previous data.)  MEWS Systolic 0 (Simultaneous filing. User may not have seen previous data.)  MEWS Pulse 2 (Simultaneous filing. User may not have seen previous data.)  MEWS RR 0 (Simultaneous filing. User may not have seen previous data.)  MEWS LOC 0 (Simultaneous filing. User may not have seen previous data.)  MEWS Score 4 (Simultaneous filing. User may not have seen previous data.)  MEWS Score Color Red (Simultaneous filing. User may not have seen previous data.)  Assess: if the MEWS score is Yellow or Red  Were vital signs taken at a resting state? Yes  Focused Assessment No change from prior assessment  Does the patient meet 2 or more of the SIRS criteria? Yes  Does the patient have a confirmed or suspected source of infection? Yes  Provider and Rapid Response Notified? Yes  MEWS guidelines implemented *See Row Information* No, previously red, continue vital signs every 4 hours  Treat  Pain Scale 0-10  Pain Score 7  Pain Intervention(s) Medication (See eMAR)  Take Vital Signs  Increase Vital Sign Frequency  Yellow: Q 2hr X 2 then Q 4hr X 2, if remains yellow, continue Q 4hrs  Escalate  MEWS: Escalate Yellow: discuss with charge nurse/RN and consider discussing with provider and RRT  Notify: Charge Nurse/RN  Name of Charge Nurse/RN Notified Setsuko Robins  Notify: Provider  Provider Name/Title Rehman DO  Date Provider Notified 07/04/21  Time Provider Notified 2024  Method of Notification Page  Notification Reason Change in status  Date of Provider Response 07/04/21  Time of Provider Response 2024  Notify: Rapid Response  Name of Rapid Response RN Notified no patient temp is elevated 101.7 given tylenol (patient had 103  temp yesterday)  Document  Patient Outcome  Stabilized after interventions  Progress note created (see row info) Yes  Assess: SIRS CRITERIA  SIRS Temperature  1  SIRS Pulse 1  SIRS Respirations  0  SIRS WBC 0  SIRS Score Sum  2

## 2021-07-04 NOTE — Progress Notes (Signed)
Request to IR for tunneled HD catheter removal due to bacteremia.  On chart review vascular surgery has been consulted for same and they have plans in place to remove tunneled right IJ HD catheter and peritoneal catheter tomorrow in the OR.  Will defer to vascular surgery at this time.  Please call IR with any questions or concerns.  Candiss Norse, PA-C

## 2021-07-05 ENCOUNTER — Inpatient Hospital Stay (HOSPITAL_COMMUNITY): Payer: 59 | Admitting: Certified Registered Nurse Anesthetist

## 2021-07-05 ENCOUNTER — Inpatient Hospital Stay (HOSPITAL_COMMUNITY): Payer: 59

## 2021-07-05 ENCOUNTER — Encounter (HOSPITAL_COMMUNITY): Payer: Self-pay | Admitting: Internal Medicine

## 2021-07-05 ENCOUNTER — Encounter (HOSPITAL_COMMUNITY)
Admission: EM | Disposition: A | Discharge status: Home / Self Care | Payer: Self-pay | Source: Home / Self Care | Attending: Internal Medicine

## 2021-07-05 DIAGNOSIS — T8571XA Infection and inflammatory reaction due to peritoneal dialysis catheter, initial encounter: Secondary | ICD-10-CM | POA: Diagnosis not present

## 2021-07-05 DIAGNOSIS — I120 Hypertensive chronic kidney disease with stage 5 chronic kidney disease or end stage renal disease: Secondary | ICD-10-CM

## 2021-07-05 DIAGNOSIS — Z992 Dependence on renal dialysis: Secondary | ICD-10-CM

## 2021-07-05 DIAGNOSIS — E1122 Type 2 diabetes mellitus with diabetic chronic kidney disease: Secondary | ICD-10-CM

## 2021-07-05 DIAGNOSIS — N186 End stage renal disease: Secondary | ICD-10-CM

## 2021-07-05 DIAGNOSIS — B958 Unspecified staphylococcus as the cause of diseases classified elsewhere: Secondary | ICD-10-CM

## 2021-07-05 DIAGNOSIS — R7881 Bacteremia: Secondary | ICD-10-CM

## 2021-07-05 HISTORY — PX: INSERTION OF DIALYSIS CATHETER: SHX1324

## 2021-07-05 LAB — BLOOD GAS, VENOUS
Acid-Base Excess: 4.2 mmol/L — ABNORMAL HIGH (ref 0.0–2.0)
Bicarbonate: 29.2 mmol/L — ABNORMAL HIGH (ref 20.0–28.0)
O2 Saturation: 90.6 %
Patient temperature: 36.2
pCO2, Ven: 42 mmHg — ABNORMAL LOW (ref 44–60)
pH, Ven: 7.44 — ABNORMAL HIGH (ref 7.25–7.43)
pO2, Ven: 49 mmHg — ABNORMAL HIGH (ref 32–45)

## 2021-07-05 LAB — GLUCOSE, CAPILLARY
Glucose-Capillary: 149 mg/dL — ABNORMAL HIGH (ref 70–99)
Glucose-Capillary: 162 mg/dL — ABNORMAL HIGH (ref 70–99)
Glucose-Capillary: 174 mg/dL — ABNORMAL HIGH (ref 70–99)
Glucose-Capillary: 174 mg/dL — ABNORMAL HIGH (ref 70–99)
Glucose-Capillary: 157 mg/dL — ABNORMAL HIGH (ref 70–99)
Glucose-Capillary: 177 mg/dL — ABNORMAL HIGH (ref 70–99)

## 2021-07-05 LAB — CBC
HCT: 22.6 % — ABNORMAL LOW (ref 36.0–46.0)
Hemoglobin: 7.7 g/dL — ABNORMAL LOW (ref 12.0–15.0)
MCH: 28.3 pg (ref 26.0–34.0)
MCHC: 34.1 g/dL (ref 30.0–36.0)
MCV: 83.1 fL (ref 80.0–100.0)
Platelets: 236 10*3/uL (ref 150–400)
RBC: 2.72 MIL/uL — ABNORMAL LOW (ref 3.87–5.11)
RDW: 16.5 % — ABNORMAL HIGH (ref 11.5–15.5)
WBC: 13 10*3/uL — ABNORMAL HIGH (ref 4.0–10.5)
nRBC: 0 % (ref 0.0–0.2)

## 2021-07-05 LAB — BASIC METABOLIC PANEL
Anion gap: 16 — ABNORMAL HIGH (ref 5–15)
Anion gap: 17 — ABNORMAL HIGH (ref 5–15)
Anion gap: 16 — ABNORMAL HIGH (ref 5–15)
Anion gap: 20 — ABNORMAL HIGH (ref 5–15)
BUN: 40 mg/dL — ABNORMAL HIGH (ref 6–20)
BUN: 44 mg/dL — ABNORMAL HIGH (ref 6–20)
BUN: 45 mg/dL — ABNORMAL HIGH (ref 6–20)
BUN: 49 mg/dL — ABNORMAL HIGH (ref 6–20)
CO2: 22 mmol/L (ref 22–32)
CO2: 22 mmol/L (ref 22–32)
CO2: 21 mmol/L — ABNORMAL LOW (ref 22–32)
CO2: 21 mmol/L — ABNORMAL LOW (ref 22–32)
Calcium: 8.2 mg/dL — ABNORMAL LOW (ref 8.9–10.3)
Calcium: 8.4 mg/dL — ABNORMAL LOW (ref 8.9–10.3)
Calcium: 8.3 mg/dL — ABNORMAL LOW (ref 8.9–10.3)
Calcium: 9.1 mg/dL (ref 8.9–10.3)
Chloride: 89 mmol/L — ABNORMAL LOW (ref 98–111)
Chloride: 88 mmol/L — ABNORMAL LOW (ref 98–111)
Chloride: 89 mmol/L — ABNORMAL LOW (ref 98–111)
Chloride: 86 mmol/L — ABNORMAL LOW (ref 98–111)
Creatinine, Ser: 6.54 mg/dL — ABNORMAL HIGH (ref 0.44–1.00)
Creatinine, Ser: 6.81 mg/dL — ABNORMAL HIGH (ref 0.44–1.00)
Creatinine, Ser: 7.09 mg/dL — ABNORMAL HIGH (ref 0.44–1.00)
Creatinine, Ser: 7.72 mg/dL — ABNORMAL HIGH (ref 0.44–1.00)
GFR, Estimated: 8 mL/min — ABNORMAL LOW (ref >60)
GFR, Estimated: 8 mL/min — ABNORMAL LOW (ref >60)
GFR, Estimated: 7 mL/min — ABNORMAL LOW (ref >60)
GFR, Estimated: 7 mL/min — ABNORMAL LOW (ref >60)
Glucose, Bld: 179 mg/dL — ABNORMAL HIGH (ref 70–99)
Glucose, Bld: 161 mg/dL — ABNORMAL HIGH (ref 70–99)
Glucose, Bld: 169 mg/dL — ABNORMAL HIGH (ref 70–99)
Glucose, Bld: 157 mg/dL — ABNORMAL HIGH (ref 70–99)
Potassium: 3.7 mmol/L (ref 3.5–5.1)
Potassium: 3.6 mmol/L (ref 3.5–5.1)
Potassium: 4.1 mmol/L (ref 3.5–5.1)
Potassium: 3.6 mmol/L (ref 3.5–5.1)
Sodium: 127 mmol/L — ABNORMAL LOW (ref 135–145)
Sodium: 127 mmol/L — ABNORMAL LOW (ref 135–145)
Sodium: 126 mmol/L — ABNORMAL LOW (ref 135–145)
Sodium: 127 mmol/L — ABNORMAL LOW (ref 135–145)

## 2021-07-05 LAB — CULTURE, BLOOD (ROUTINE X 2)
Special Requests: ADEQUATE
Special Requests: ADEQUATE

## 2021-07-05 LAB — ECHOCARDIOGRAM COMPLETE
Area-P 1/2: 5.13 cm2
Height: 65.984 in
S' Lateral: 3.2 cm
Weight: 2342.17 oz

## 2021-07-05 LAB — LACTIC ACID, PLASMA: Lactic Acid, Venous: 0.6 mmol/L (ref 0.5–1.9)

## 2021-07-05 LAB — RETICULOCYTES
Immature Retic Fract: 6.7 % (ref 2.3–15.9)
RBC.: 2.69 MIL/uL — ABNORMAL LOW (ref 3.87–5.11)
Retic Count, Absolute: 35.8 10*3/uL (ref 19.0–186.0)
Retic Ct Pct: 1.3 % (ref 0.4–3.1)

## 2021-07-05 LAB — IRON AND TIBC
Iron: 21 ug/dL — ABNORMAL LOW (ref 28–170)
Saturation Ratios: 14 % (ref 10.4–31.8)
TIBC: 147 ug/dL — ABNORMAL LOW (ref 250–450)
UIBC: 126 ug/dL

## 2021-07-05 LAB — FERRITIN: Ferritin: 390 ng/mL — ABNORMAL HIGH (ref 11–307)

## 2021-07-05 LAB — BETA-HYDROXYBUTYRIC ACID: Beta-Hydroxybutyric Acid: 0.64 mmol/L — ABNORMAL HIGH (ref 0.05–0.27)

## 2021-07-05 LAB — RHEUMATOID FACTOR: Rheumatoid fact SerPl-aCnc: 31.7 IU/mL — ABNORMAL HIGH (ref <14.0)

## 2021-07-05 IMAGING — MR MR CERVICAL SPINE W/O CM
5 series · 34 of 48 positions shown · non-contrast
Comparison: Prior CT from [DATE].

CLINICAL DATA: 30-year-old female with end-stage renal disease with
history of MSSA bacteremia, likely secondary to tunnel hemodialysis
catheter. Generalized myalgias and back pain. Evaluate for
infection.

EXAM:
MRI CERVICAL, THORACIC AND LUMBAR SPINE WITHOUT CONTRAST
TECHNIQUE: Multiplanar and multiecho pulse sequences of the cervical spine, to
include the craniocervical junction and cervicothoracic junction,
and thoracic and lumbar spine, were obtained without intravenous
contrast.

[Series 5: T2 · sagittal · 3.0mm · 0.69mm/px · 6 of 15 slices shown (1 of 2)]
[im 1/15]
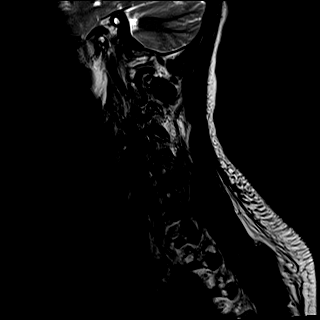
[im 3/15]
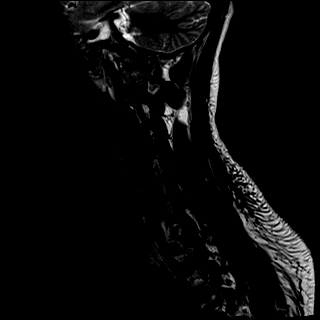
[im 6/15]
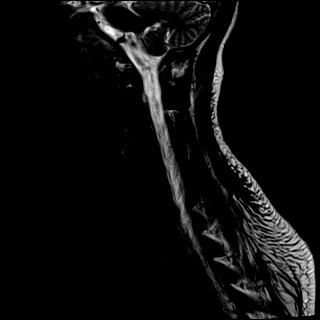
[im 9/15]
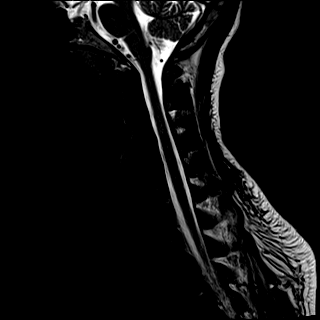
[im 12/15]
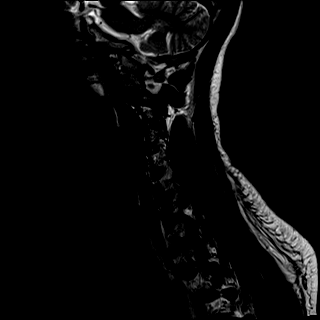
[im 15/15]
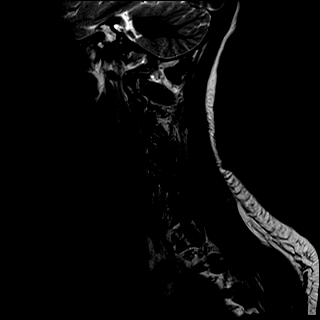

[Series 6: T1 · sagittal · 3.0mm · 0.69mm/px · 6 of 15 slices shown]
[im 1/15]
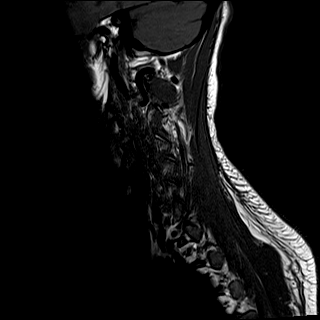
[im 3/15]
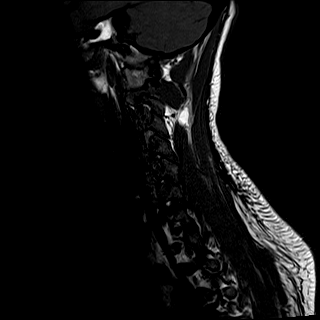
[im 6/15]
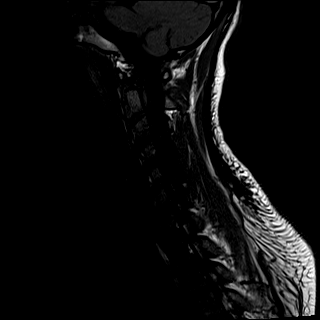
[im 9/15]
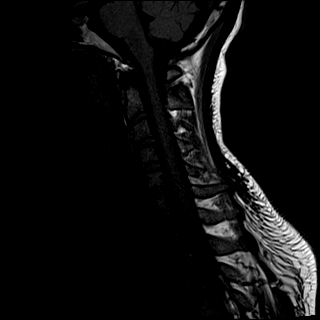
[im 12/15]
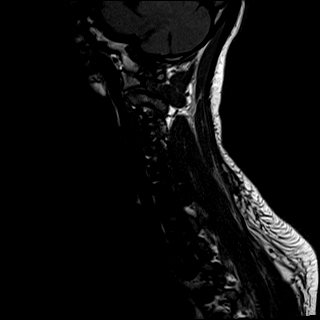
[im 15/15]
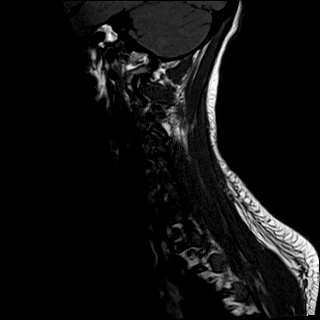

[Series 7: STIR · sagittal · 3.0mm · 0.86mm/px · 6 of 15 slices shown]
[im 1/15]
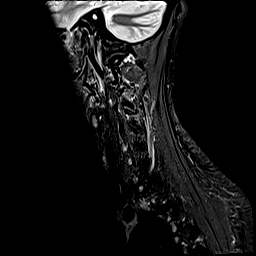
[im 3/15]
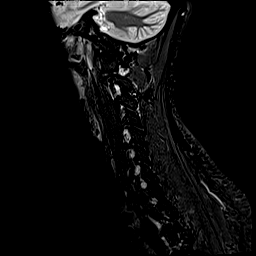
[im 6/15]
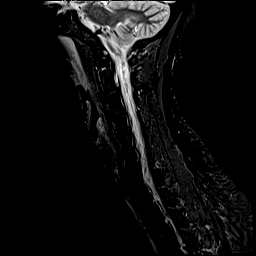
[im 9/15]
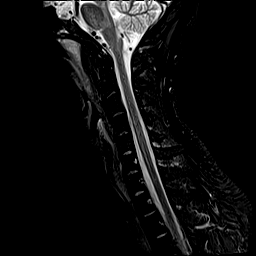
[im 12/15]
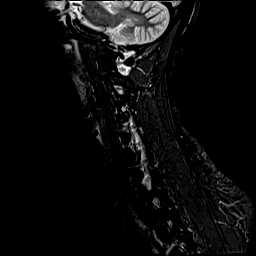
[im 15/15]
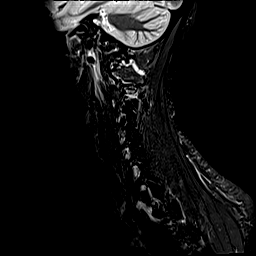

[Series 8: T2 · axial · 3.0mm · 0.66mm/px · z∈[-109,+15]mm · 9 of 40 slices shown (2 of 2)]
[im 1/40]
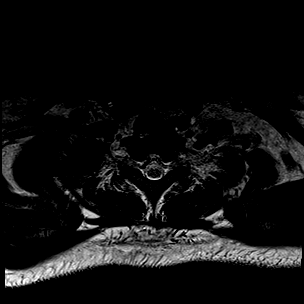
[im 6/40]
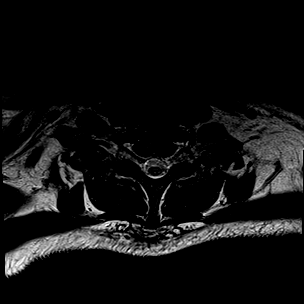
[im 12/40]
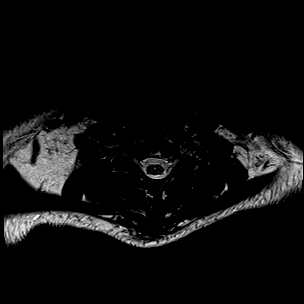
[im 17/40]
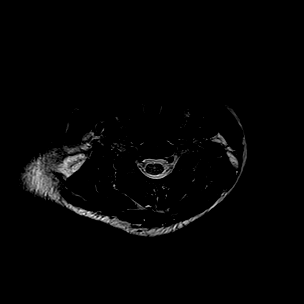
[im 20/40]
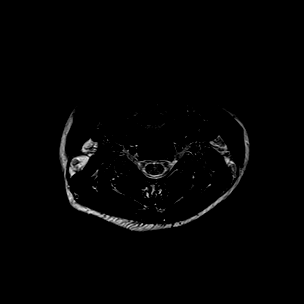
[im 23/40]
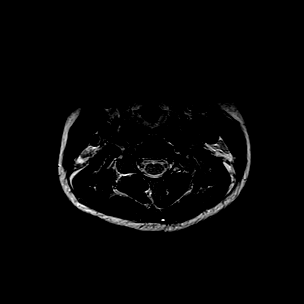
[im 28/40]
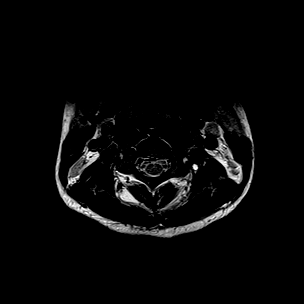
[im 34/40]
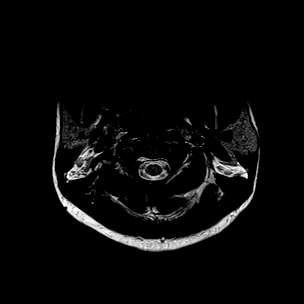
[im 40/40]
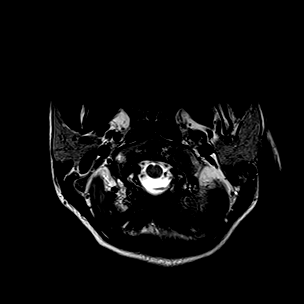

[Series 9: GRE · axial · 3.0mm · 0.39mm/px · z∈[-109,-4]mm · 7 of 40 slices shown]
[im 1/40]
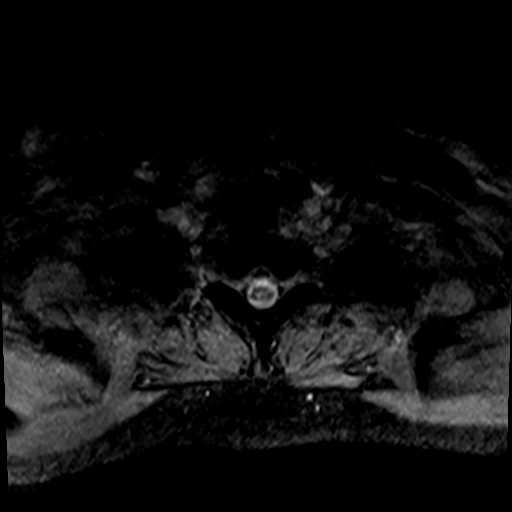
[im 6/40]
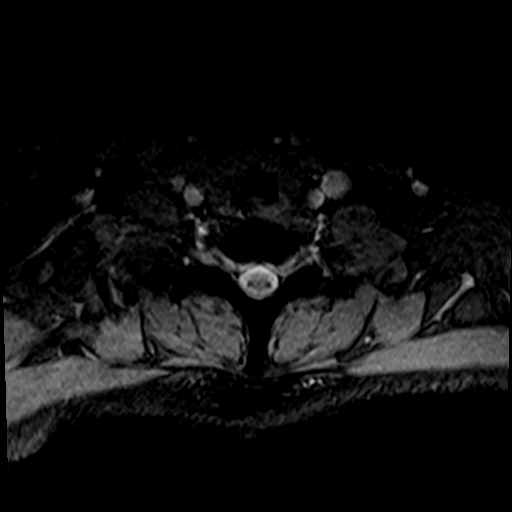
[im 12/40]
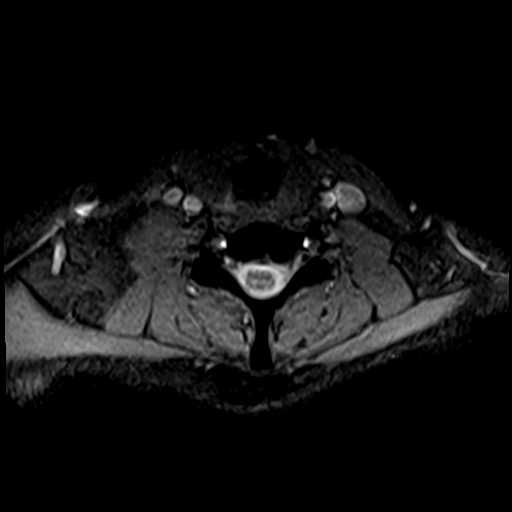
[im 17/40]
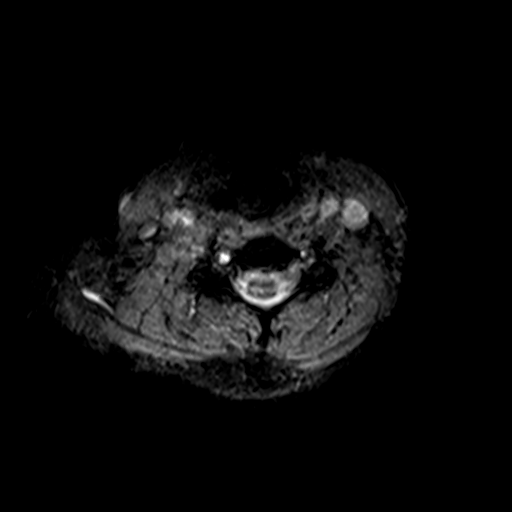
[im 23/40]
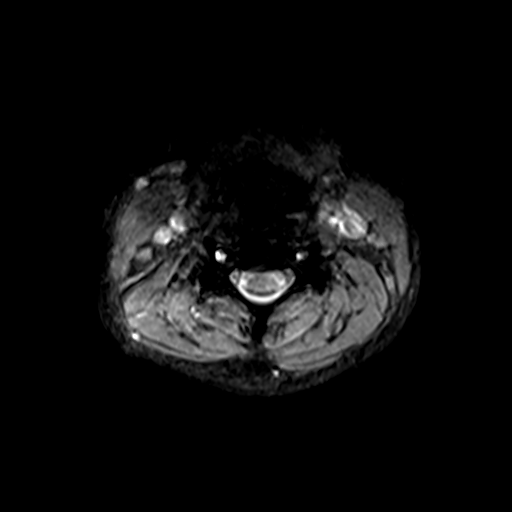
[im 28/40]
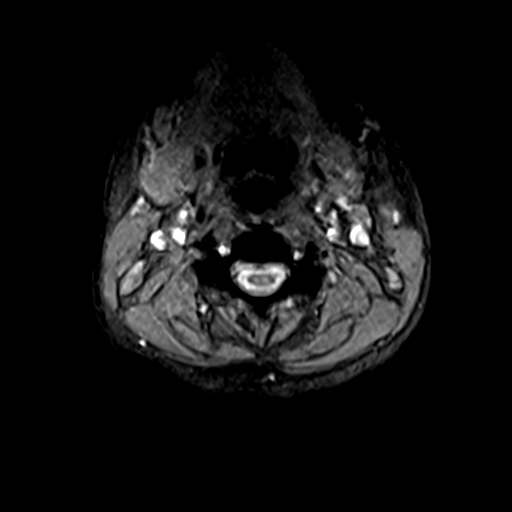
[im 34/40]
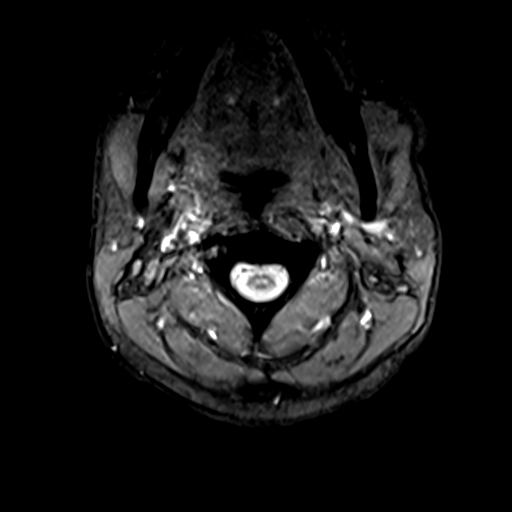

[34 of 48 positions shown; findings below may reference images not displayed]

FINDINGS: MRI CERVICAL SPINE FINDINGS

Alignment: Straightening with mild reversal of the normal cervical
lordosis. No listhesis.

Vertebrae: Vertebral body height maintained without acute or chronic
fracture. Diffusely decreased T1 signal intensity seen throughout
the visualized bone marrow, likely related to patient history of
end-stage renal disease and anemia. No discrete or worrisome osseous
lesions. No abnormal marrow edema or inflammatory changes to suggest
acute osteomyelitis discitis or septic arthritis.

Cord: Normal signal and morphology. No epidural abscess or other
collection.

Posterior Fossa, vertebral arteries, paraspinal tissues: Visualized
brain and posterior fossa within normal limits. Craniocervical
junction normal. Paraspinous soft tissues within normal limits. No
acute inflammatory changes or loculated collections. Normal flow
voids seen within the vertebral arteries bilaterally. Right-sided
central venous catheter partially visualized.

Disc levels:

No significant disc pathology seen within the cervical spine. No
stenosis or neural impingement.

MRI THORACIC SPINE FINDINGS

Alignment: Physiologic with preservation of the normal thoracic
kyphosis. No listhesis.

Vertebrae: Vertebral body height maintained without acute or chronic
fracture. Diffusely decreased T1 signal intensity seen throughout
the visualized bone marrow, again likely related to patient history
of end-stage renal disease. Few scattered benign hemangiomata noted.
No worrisome osseous lesions. No evidence for osteomyelitis discitis
or septic arthritis within the thoracic spine.

Cord: Normal signal morphology. No epidural abscess or other
collection.

Paraspinal and other soft tissues: Paraspinous soft tissues
demonstrate no acute inflammatory changes. No loculated soft tissue
collections. Small layering bilateral pleural effusions noted.

Disc levels:

No significant disc pathology seen within the thoracic spine. No
stenosis or impingement.

MRI LUMBAR SPINE FINDINGS

Segmentation: Standard. Lowest well-formed disc space labeled the
L5-S1 level.

Alignment: Physiologic with preservation of the normal lumbar
lordosis. No listhesis.

Vertebrae: Vertebral body height maintained without acute or chronic
fracture. Diffusely decreased T1 signal intensity seen throughout
the visualized bone marrow related to patient's history of end-stage
renal disease. Few scattered benign hemangiomata noted. No worrisome
osseous lesions. No evidence for osteomyelitis discitis or septic
arthritis.

Conus medullaris and cauda equina: Conus extends to the a 1 level.
Conus and cauda equina appear normal. No epidural abscess or other
collection.

Paraspinal and other soft tissues: Paraspinous soft tissues
demonstrate no acute inflammatory changes. No loculated soft tissue
collections.

Disc levels:

L1-2:  Unremarkable.

L2-3:  Unremarkable.

L3-4: Normal interspace. Mild facet hypertrophy. No canal or
foraminal stenosis.

L4-5: Normal interspace. Mild facet hypertrophy. No significant
spinal stenosis. Mild bilateral L4 foraminal narrowing.

L5-S1: Normal interspace. Mild facet hypertrophy. No canal or
significant foraminal stenosis.
IMPRESSION: 1. No MRI evidence for acute infection within the cervical,
thoracic, or lumbar spine. No epidural abscess or other inflammatory
changes.
2. Mild lower lumbar facet hypertrophy.  No significant stenosis.
3. Small layering bilateral pleural effusions.

## 2021-07-05 IMAGING — MR MR LUMBAR SPINE W/O CM
4 of 5 series · 25 of 48 positions shown · non-contrast
Comparison: Prior CT from [DATE].

CLINICAL DATA: 30-year-old female with end-stage renal disease with
history of MSSA bacteremia, likely secondary to tunnel hemodialysis
catheter. Generalized myalgias and back pain. Evaluate for
infection.

EXAM:
MRI CERVICAL, THORACIC AND LUMBAR SPINE WITHOUT CONTRAST
TECHNIQUE: Multiplanar and multiecho pulse sequences of the cervical spine, to
include the craniocervical junction and cervicothoracic junction,
and thoracic and lumbar spine, were obtained without intravenous
contrast.

[Series 5: T2 · sagittal · 4.0mm · 0.73mm/px · 6 of 17 slices shown (1 of 2)]
[im 1/17]
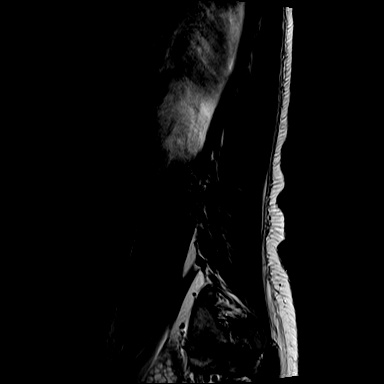
[im 4/17]
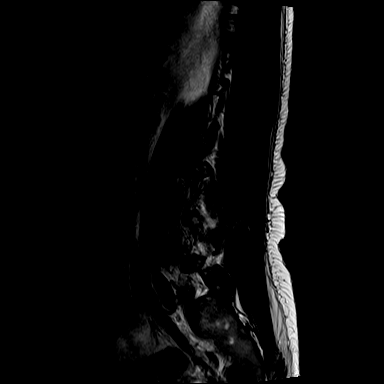
[im 7/17]
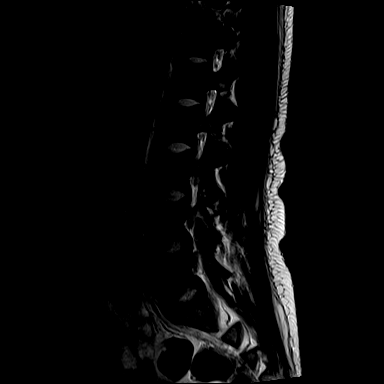
[im 10/17]
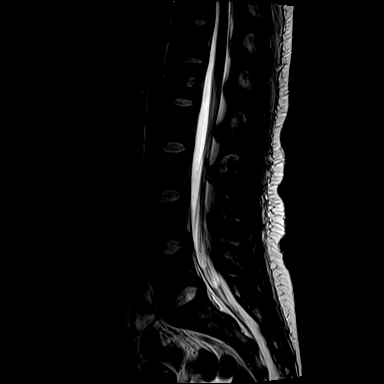
[im 13/17]
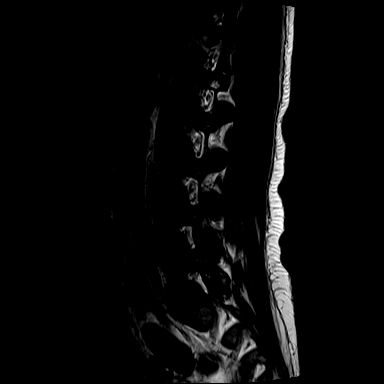
[im 17/17]
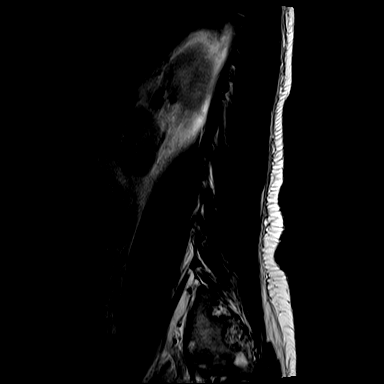

[Series 7: T1 · sagittal · 4.0mm · 0.88mm/px · 7 of 17 slices shown (1 of 2)]
[im 1/17]
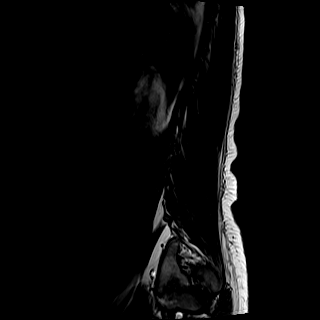
[im 3/17]
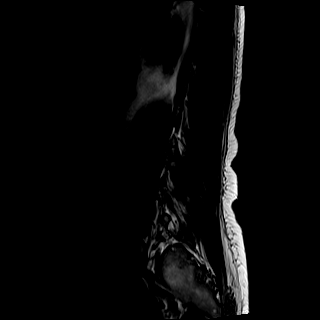
[im 6/17]
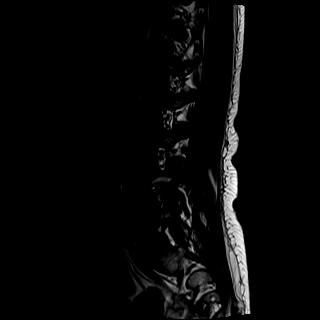
[im 9/17]
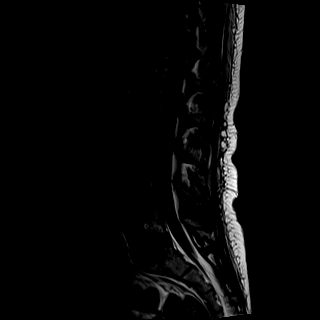
[im 11/17]
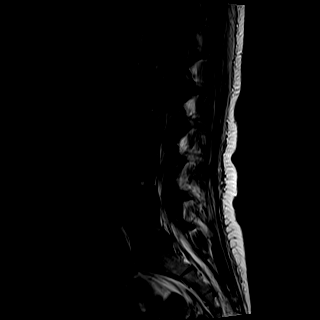
[im 14/17]
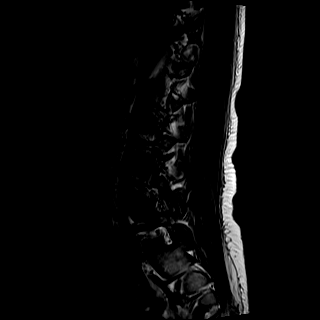
[im 17/17]
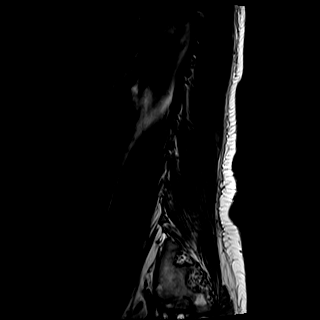

[Series 8: T2 · axial · 4.0mm · 0.57mm/px · z∈[-140,+41]mm · 8 of 35 slices shown (2 of 2)]
[im 1/35]
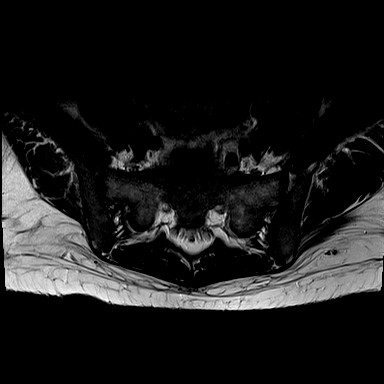
[im 6/35]
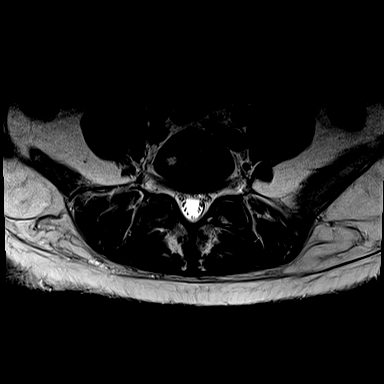
[im 11/35]
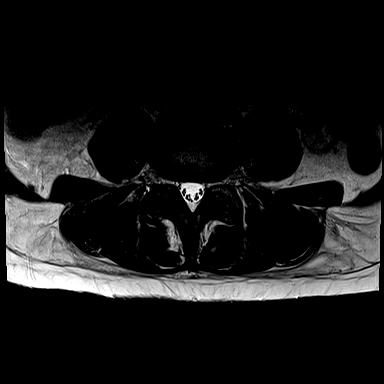
[im 16/35]
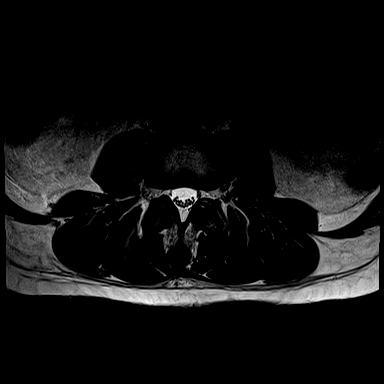
[im 19/35]
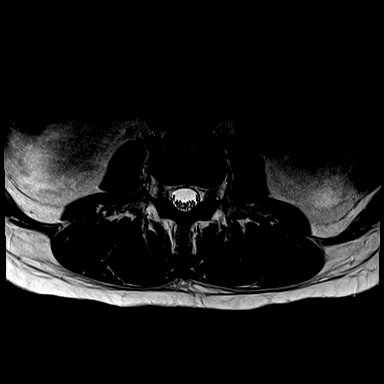
[im 24/35]
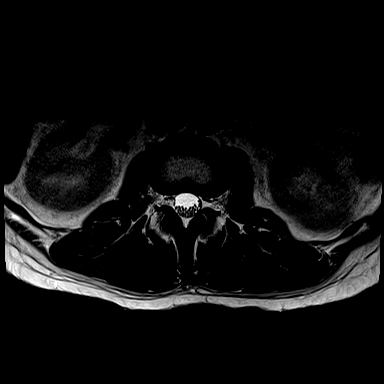
[im 29/35]
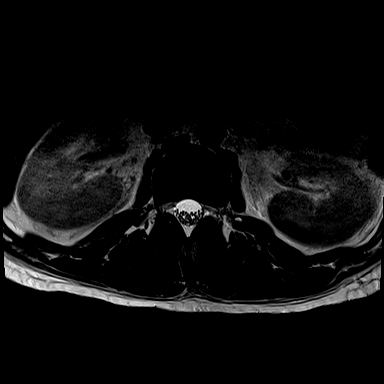
[im 35/35]
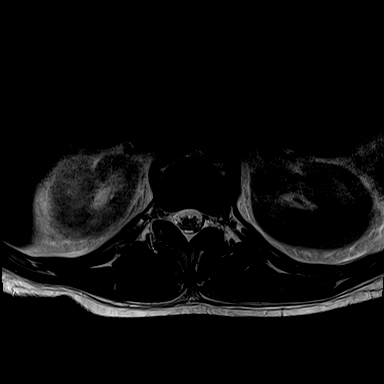

[Series 9: T1 · axial · 4.0mm · 0.34mm/px · z∈[-140,+11]mm · 4 of 35 slices shown (2 of 2)]
[im 1/35]
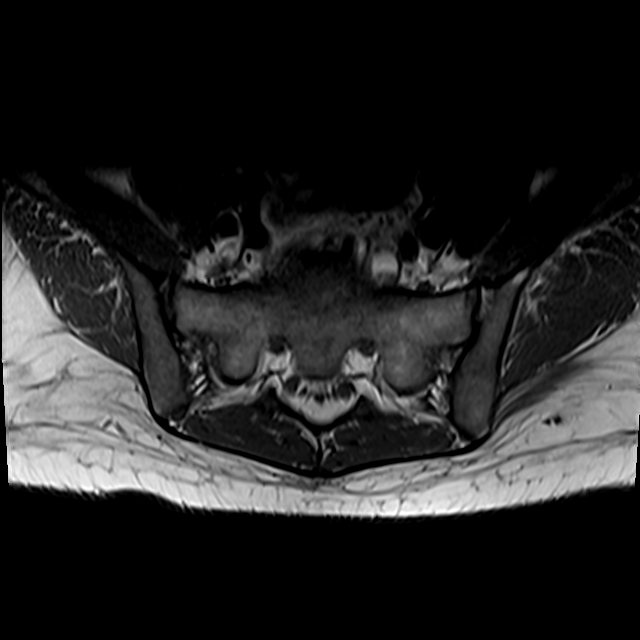
[im 6/35]
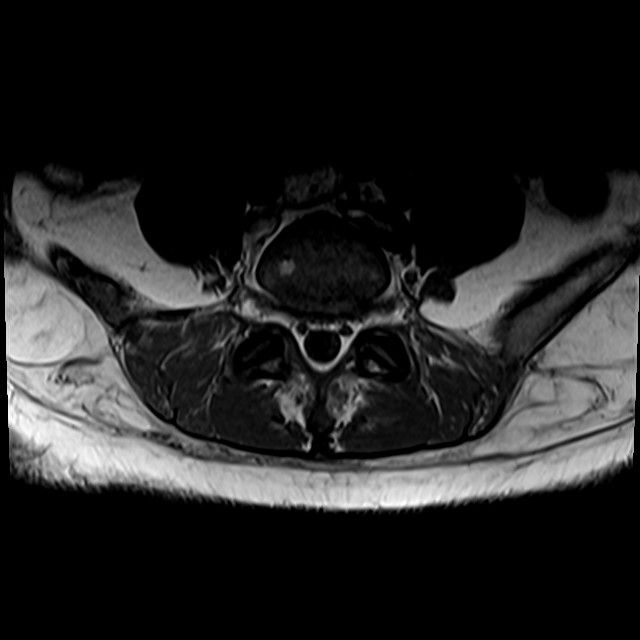
[im 19/35]
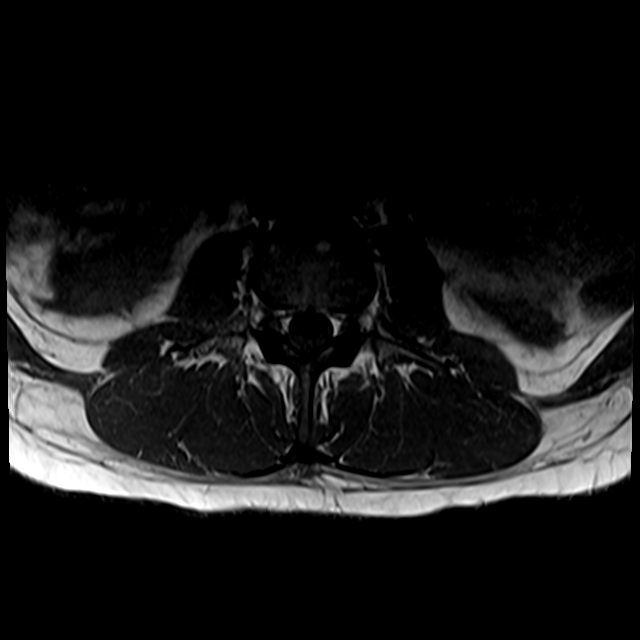
[im 29/35]
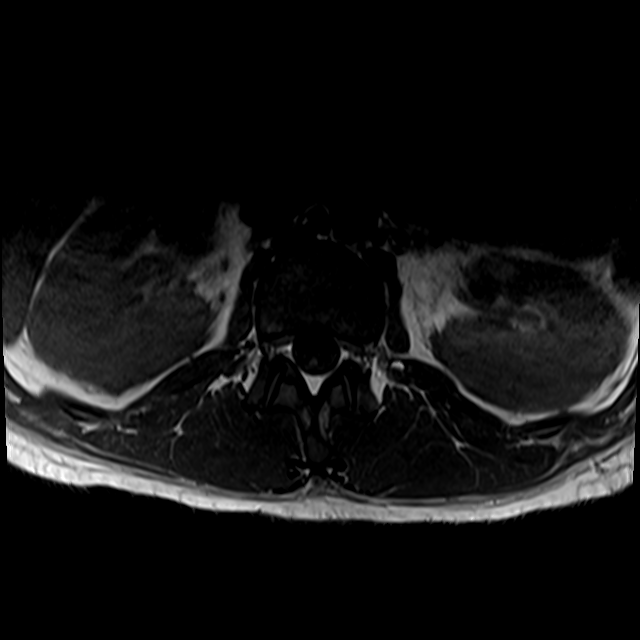

[25 of 48 positions shown; findings below may reference images not displayed]

FINDINGS: MRI CERVICAL SPINE FINDINGS

Alignment: Straightening with mild reversal of the normal cervical
lordosis. No listhesis.

Vertebrae: Vertebral body height maintained without acute or chronic
fracture. Diffusely decreased T1 signal intensity seen throughout
the visualized bone marrow, likely related to patient history of
end-stage renal disease and anemia. No discrete or worrisome osseous
lesions. No abnormal marrow edema or inflammatory changes to suggest
acute osteomyelitis discitis or septic arthritis.

Cord: Normal signal and morphology. No epidural abscess or other
collection.

Posterior Fossa, vertebral arteries, paraspinal tissues: Visualized
brain and posterior fossa within normal limits. Craniocervical
junction normal. Paraspinous soft tissues within normal limits. No
acute inflammatory changes or loculated collections. Normal flow
voids seen within the vertebral arteries bilaterally. Right-sided
central venous catheter partially visualized.

Disc levels:

No significant disc pathology seen within the cervical spine. No
stenosis or neural impingement.

MRI THORACIC SPINE FINDINGS

Alignment: Physiologic with preservation of the normal thoracic
kyphosis. No listhesis.

Vertebrae: Vertebral body height maintained without acute or chronic
fracture. Diffusely decreased T1 signal intensity seen throughout
the visualized bone marrow, again likely related to patient history
of end-stage renal disease. Few scattered benign hemangiomata noted.
No worrisome osseous lesions. No evidence for osteomyelitis discitis
or septic arthritis within the thoracic spine.

Cord: Normal signal morphology. No epidural abscess or other
collection.

Paraspinal and other soft tissues: Paraspinous soft tissues
demonstrate no acute inflammatory changes. No loculated soft tissue
collections. Small layering bilateral pleural effusions noted.

Disc levels:

No significant disc pathology seen within the thoracic spine. No
stenosis or impingement.

MRI LUMBAR SPINE FINDINGS

Segmentation: Standard. Lowest well-formed disc space labeled the
L5-S1 level.

Alignment: Physiologic with preservation of the normal lumbar
lordosis. No listhesis.

Vertebrae: Vertebral body height maintained without acute or chronic
fracture. Diffusely decreased T1 signal intensity seen throughout
the visualized bone marrow related to patient's history of end-stage
renal disease. Few scattered benign hemangiomata noted. No worrisome
osseous lesions. No evidence for osteomyelitis discitis or septic
arthritis.

Conus medullaris and cauda equina: Conus extends to the a 1 level.
Conus and cauda equina appear normal. No epidural abscess or other
collection.

Paraspinal and other soft tissues: Paraspinous soft tissues
demonstrate no acute inflammatory changes. No loculated soft tissue
collections.

Disc levels:

L1-2:  Unremarkable.

L2-3:  Unremarkable.

L3-4: Normal interspace. Mild facet hypertrophy. No canal or
foraminal stenosis.

L4-5: Normal interspace. Mild facet hypertrophy. No significant
spinal stenosis. Mild bilateral L4 foraminal narrowing.

L5-S1: Normal interspace. Mild facet hypertrophy. No canal or
significant foraminal stenosis.
IMPRESSION: 1. No MRI evidence for acute infection within the cervical,
thoracic, or lumbar spine. No epidural abscess or other inflammatory
changes.
2. Mild lower lumbar facet hypertrophy.  No significant stenosis.
3. Small layering bilateral pleural effusions.

## 2021-07-05 SURGERY — REVISION, CATHETER, CAPD, LAPAROSCOPIC
Anesthesia: General | Site: Chest | Laterality: Right

## 2021-07-05 MED ORDER — CEFAZOLIN SODIUM-DEXTROSE 2-4 GM/100ML-% IV SOLN
2.0000 g | Freq: Once | INTRAVENOUS | Status: AC
  Administered 2021-07-05: 2 g via INTRAVENOUS

## 2021-07-05 MED ORDER — ACETAMINOPHEN 10 MG/ML IV SOLN
INTRAVENOUS | Status: AC
Start: 2021-07-05 — End: ?
  Filled 2021-07-05: qty 100

## 2021-07-05 MED ORDER — FENTANYL CITRATE (PF) 250 MCG/5ML IJ SOLN
INTRAMUSCULAR | Status: AC
  Filled 2021-07-05: qty 5

## 2021-07-05 MED ORDER — LIDOCAINE 2% (20 MG/ML) 5 ML SYRINGE
INTRAMUSCULAR | Status: DC | PRN
  Administered 2021-07-05: 60 mg via INTRAVENOUS

## 2021-07-05 MED ORDER — LIDOCAINE-EPINEPHRINE (PF) 1 %-1:200000 IJ SOLN
INTRAMUSCULAR | Status: AC
  Filled 2021-07-05: qty 30

## 2021-07-05 MED ORDER — PROSOURCE PLUS PO LIQD
30.0000 mL | Freq: Two times a day (BID) | ORAL | Status: DC
  Administered 2021-07-08: 30 mL via ORAL
  Filled 2021-07-05 (×6): qty 30

## 2021-07-05 MED ORDER — HYDROMORPHONE HCL 1 MG/ML IJ SOLN
0.5000 mg | INTRAMUSCULAR | Status: DC | PRN
  Administered 2021-07-05 – 2021-07-06 (×2): 0.5 mg via INTRAVENOUS
  Filled 2021-07-05 (×2): qty 0.5

## 2021-07-05 MED ORDER — ACETAMINOPHEN 10 MG/ML IV SOLN
INTRAVENOUS | Status: DC | PRN
  Administered 2021-07-05: 1000 mg via INTRAVENOUS

## 2021-07-05 MED ORDER — CEFAZOLIN SODIUM-DEXTROSE 2-4 GM/100ML-% IV SOLN
INTRAVENOUS | Status: AC
  Filled 2021-07-05: qty 100

## 2021-07-05 MED ORDER — ORAL CARE MOUTH RINSE: 15.0000 mL | Freq: Once | OROMUCOSAL | Status: AC

## 2021-07-05 MED ORDER — CHLORHEXIDINE GLUCONATE 0.12 % MT SOLN
15.0000 mL | Freq: Once | OROMUCOSAL | Status: AC
  Administered 2021-07-05: 15 mL via OROMUCOSAL
  Filled 2021-07-05: qty 15

## 2021-07-05 MED ORDER — HYDROMORPHONE HCL 2 MG PO TABS
2.0000 mg | ORAL_TABLET | ORAL | Status: DC | PRN
  Administered 2021-07-05 – 2021-07-06 (×3): 2 mg via ORAL
  Filled 2021-07-05 (×3): qty 1

## 2021-07-05 MED ORDER — 0.9 % SODIUM CHLORIDE (POUR BTL) OPTIME
TOPICAL | Status: DC | PRN
  Administered 2021-07-05: 1000 mL

## 2021-07-05 MED ORDER — HYDROMORPHONE HCL 1 MG/ML IJ SOLN: 0.2500 mg | INTRAMUSCULAR | Status: DC | PRN

## 2021-07-05 MED ORDER — MIDAZOLAM HCL 2 MG/2ML IJ SOLN
INTRAMUSCULAR | Status: DC | PRN
  Administered 2021-07-05: 2 mg via INTRAVENOUS

## 2021-07-05 MED ORDER — PROPOFOL 10 MG/ML IV BOLUS
INTRAVENOUS | Status: AC
  Filled 2021-07-05: qty 20

## 2021-07-05 MED ORDER — INSULIN ASPART 100 UNIT/ML IJ SOLN
0.0000 [IU] | INTRAMUSCULAR | Status: DC
  Administered 2021-07-05 – 2021-07-06 (×4): 1 [IU] via SUBCUTANEOUS

## 2021-07-05 MED ORDER — SODIUM CHLORIDE 0.9 % IV SOLN
INTRAVENOUS | Status: DC
Start: 2021-07-05 — End: 2021-07-05

## 2021-07-05 MED ORDER — INSULIN ASPART 100 UNIT/ML IJ SOLN: 0.0000 [IU] | INTRAMUSCULAR | Status: DC | PRN

## 2021-07-05 MED ORDER — HEPARIN SODIUM (PORCINE) 1000 UNIT/ML IJ SOLN
INTRAMUSCULAR | Status: AC
  Filled 2021-07-05: qty 10

## 2021-07-05 MED ORDER — ONDANSETRON HCL 4 MG/2ML IJ SOLN
INTRAMUSCULAR | Status: DC | PRN
  Administered 2021-07-05: 4 mg via INTRAVENOUS

## 2021-07-05 MED ORDER — PROPOFOL 500 MG/50ML IV EMUL
INTRAVENOUS | Status: DC | PRN
  Administered 2021-07-05: 200 ug/kg/min via INTRAVENOUS

## 2021-07-05 MED ORDER — MIDAZOLAM HCL 2 MG/2ML IJ SOLN
INTRAMUSCULAR | Status: AC
  Filled 2021-07-05: qty 2

## 2021-07-05 MED ORDER — FENTANYL CITRATE (PF) 250 MCG/5ML IJ SOLN
INTRAMUSCULAR | Status: DC | PRN
  Administered 2021-07-05: 25 ug via INTRAVENOUS
  Administered 2021-07-05: 50 ug via INTRAVENOUS
  Administered 2021-07-05: 25 ug via INTRAVENOUS
  Administered 2021-07-05 (×2): 50 ug via INTRAVENOUS

## 2021-07-05 MED ORDER — PROPOFOL 10 MG/ML IV BOLUS
INTRAVENOUS | Status: DC | PRN
  Administered 2021-07-05 (×2): 50 mg via INTRAVENOUS
  Administered 2021-07-05: 150 mg via INTRAVENOUS

## 2021-07-05 MED ORDER — HEPARIN 6000 UNIT IRRIGATION SOLUTION
Status: AC
  Filled 2021-07-05: qty 500

## 2021-07-05 SURGICAL SUPPLY — 63 items
ADAPTER TITANIUM MEDIONICS (MISCELLANEOUS) IMPLANT
BAG DECANTER FOR FLEXI CONT (MISCELLANEOUS) ×2 IMPLANT
BENZOIN TINCTURE PRP APPL 2/3 (GAUZE/BANDAGES/DRESSINGS) ×3 IMPLANT
BIOPATCH RED 1 DISK 7.0 (GAUZE/BANDAGES/DRESSINGS) ×2 IMPLANT
BLADE CLIPPER SURG (BLADE) IMPLANT
BLADE SURG 11 STRL SS (BLADE) ×3 IMPLANT
CATH EXTENDED DIALYSIS (CATHETERS) IMPLANT
CHLORAPREP W/TINT 26 (MISCELLANEOUS) ×3 IMPLANT
COVER PROBE W GEL 5X96 (DRAPES) ×3 IMPLANT
COVER SURGICAL LIGHT HANDLE (MISCELLANEOUS) ×3 IMPLANT
DERMABOND ADVANCED (GAUZE/BANDAGES/DRESSINGS) ×1
DERMABOND ADVANCED .7 DNX12 (GAUZE/BANDAGES/DRESSINGS) ×2 IMPLANT
DRAPE C-ARM 42X72 X-RAY (DRAPES) ×2 IMPLANT
DRAPE CHEST BREAST 15X10 FENES (DRAPES) ×3 IMPLANT
ELECT REM PT RETURN 9FT ADLT (ELECTROSURGICAL) ×3
ELECTRODE REM PT RTRN 9FT ADLT (ELECTROSURGICAL) ×2 IMPLANT
GAUZE 4X4 16PLY ~~LOC~~+RFID DBL (SPONGE) ×3 IMPLANT
GAUZE SPONGE 4X4 12PLY STRL (GAUZE/BANDAGES/DRESSINGS) ×3 IMPLANT
GLOVE INDICATOR 6.5 STRL GRN (GLOVE) ×3 IMPLANT
GLOVE SURG SS PI 8.0 STRL IVOR (GLOVE) ×3 IMPLANT
GOWN STRL REUS W/ TWL LRG LVL3 (GOWN DISPOSABLE) ×6 IMPLANT
GOWN STRL REUS W/ TWL XL LVL3 (GOWN DISPOSABLE) ×2 IMPLANT
GOWN STRL REUS W/TWL LRG LVL3 (GOWN DISPOSABLE) ×2
GOWN STRL REUS W/TWL XL LVL3 (GOWN DISPOSABLE)
GRASPER SUT TROCAR 14GX15 (MISCELLANEOUS) IMPLANT
IV NS 1000ML (IV SOLUTION)
IV NS 1000ML BAXH (IV SOLUTION) IMPLANT
KIT BASIN OR (CUSTOM PROCEDURE TRAY) ×3 IMPLANT
KIT TURNOVER KIT B (KITS) ×3 IMPLANT
NDL 18GX1X1/2 (RX/OR ONLY) (NEEDLE) ×2 IMPLANT
NDL HYPO 25GX1X1/2 BEV (NEEDLE) ×2 IMPLANT
NDL INSUFFLATION 14GA 120MM (NEEDLE) ×2 IMPLANT
NEEDLE 18GX1X1/2 (RX/OR ONLY) (NEEDLE) ×3 IMPLANT
NEEDLE HYPO 25GX1X1/2 BEV (NEEDLE) ×3 IMPLANT
NEEDLE INSUFFLATION 14GA 120MM (NEEDLE) IMPLANT
NS IRRIG 1000ML POUR BTL (IV SOLUTION) ×3 IMPLANT
PACK SURGICAL SETUP 50X90 (CUSTOM PROCEDURE TRAY) ×3 IMPLANT
PAD ARMBOARD 7.5X6 YLW CONV (MISCELLANEOUS) ×6 IMPLANT
SCISSORS LAP 5X35 DISP (ENDOMECHANICALS) IMPLANT
SET TUBE SMOKE EVAC HIGH FLOW (TUBING) ×3 IMPLANT
SLEEVE ENDOPATH XCEL 5M (ENDOMECHANICALS) ×2 IMPLANT
SOAP 2 % CHG 4 OZ (WOUND CARE) ×3 IMPLANT
SPIKE FLUID TRANSFER (MISCELLANEOUS) ×2 IMPLANT
STRIP CLOSURE SKIN 1/2X4 (GAUZE/BANDAGES/DRESSINGS) ×3 IMPLANT
STYLET FALLER (MISCELLANEOUS) IMPLANT
SUT ETHILON 3 0 PS 1 (SUTURE) ×3 IMPLANT
SUT MNCRL AB 4-0 PS2 18 (SUTURE) ×5 IMPLANT
SUT PDS AB 0 CT 36 (SUTURE) ×1 IMPLANT
SUT PROLENE 0 SH 30 (SUTURE) ×2 IMPLANT
SUT VIC AB 3-0 SH 27 (SUTURE) ×1
SUT VIC AB 3-0 SH 27X BRD (SUTURE) IMPLANT
SYR 10ML LL (SYRINGE) ×3 IMPLANT
SYR 20CC LL (SYRINGE) ×2 IMPLANT
SYR 20ML LL LF (SYRINGE) ×4 IMPLANT
SYR 5ML LL (SYRINGE) ×2 IMPLANT
SYR CONTROL 10ML LL (SYRINGE) ×2 IMPLANT
TAPE CLOTH SURG 4X10 WHT LF (GAUZE/BANDAGES/DRESSINGS) ×3 IMPLANT
TOWEL GREEN STERILE (TOWEL DISPOSABLE) ×3 IMPLANT
TOWEL GREEN STERILE FF (TOWEL DISPOSABLE) ×6 IMPLANT
TRAY LAPAROSCOPIC MC (CUSTOM PROCEDURE TRAY) ×2 IMPLANT
TROCAR 5MMX150MM (TROCAR) ×2 IMPLANT
TROCAR XCEL NON-BLD 5MMX100MML (ENDOMECHANICALS) ×2 IMPLANT
WATER STERILE IRR 1000ML POUR (IV SOLUTION) ×3 IMPLANT

## 2021-07-05 NOTE — Anesthesia Preprocedure Evaluation (Addendum)
Anesthesia Evaluation  Patient identified by MRN, date of birth, ID band Patient awake    Reviewed: Allergy & Precautions, H&P , NPO status , Patient's Chart, lab work & pertinent test results, reviewed documented beta blocker date and time   Airway Mallampati: III  TM Distance: >3 FB Neck ROM: Full    Dental no notable dental hx. (+) Teeth Intact, Dental Advisory Given   Pulmonary asthma ,    Pulmonary exam normal breath sounds clear to auscultation       Cardiovascular hypertension, Pt. on medications and Pt. on home beta blockers  Rhythm:Regular Rate:Normal     Neuro/Psych Anxiety Depression negative neurological ROS     GI/Hepatic negative GI ROS, Neg liver ROS,   Endo/Other  diabetes, Insulin Dependent  Renal/GU ESRF and DialysisRenal disease  negative genitourinary   Musculoskeletal   Abdominal   Peds  Hematology  (+) Blood dyscrasia, anemia ,   Anesthesia Other Findings   Reproductive/Obstetrics negative OB ROS                            Anesthesia Physical Anesthesia Plan  ASA: 3  Anesthesia Plan: General   Post-op Pain Management: Tylenol PO (pre-op)*   Induction: Intravenous  PONV Risk Score and Plan: 4 or greater and Ondansetron, Midazolam and Dexamethasone  Airway Management Planned: LMA  Additional Equipment:   Intra-op Plan:   Post-operative Plan: Extubation in OR  Informed Consent: I have reviewed the patients History and Physical, chart, labs and discussed the procedure including the risks, benefits and alternatives for the proposed anesthesia with the patient or authorized representative who has indicated his/her understanding and acceptance.     Dental advisory given  Plan Discussed with: CRNA  Anesthesia Plan Comments:         Anesthesia Quick Evaluation

## 2021-07-05 NOTE — Progress Notes (Signed)
Echocardiogram 2D Echocardiogram has been performed.  Barbara Donovan 07/05/2021, 1:44 PM

## 2021-07-05 NOTE — Anesthesia Postprocedure Evaluation (Signed)
Anesthesia Post Note  Patient: Barbara Donovan  Procedure(s) Performed: REMOVAL AMBULATORY PERITONEAL DIALYSIS (CAPD) CATHETER (Left: Abdomen) REMOVAL OF DIALYSIS CATHETER (Right: Chest)     Patient location during evaluation: PACU Anesthesia Type: General Level of consciousness: awake and alert Pain management: pain level controlled Vital Signs Assessment: post-procedure vital signs reviewed and stable Respiratory status: spontaneous breathing, nonlabored ventilation and respiratory function stable Cardiovascular status: blood pressure returned to baseline and stable Postop Assessment: no apparent nausea or vomiting Anesthetic complications: no   No notable events documented.  Last Vitals:  Vitals:   07/05/21 0905 07/05/21 0920  BP: 105/65 112/68  Pulse: 89 88  Resp: (!) 22 19  Temp:  (!) 38.1 C  SpO2: (!) 88% 93%    Last Pain:  Vitals:   07/05/21 0920  TempSrc:   PainSc: 0-No pain                 Ksean Vale,W. EDMOND

## 2021-07-05 NOTE — Op Note (Signed)
DATE OF SERVICE: 07/05/2021  PATIENT:  Barbara Donovan  31 y.o. female  PRE-OPERATIVE DIAGNOSIS:  ESRD; TDC and PD catheter in place; Staphylococcus bacteremia  POST-OPERATIVE DIAGNOSIS:  Same  PROCEDURE:   1) Removal of tunneled dialysis catheter 2) Removal of peritoneal dialysis catheter  SURGEON:  Surgeon(s) and Role:    * Cherre Robins, MD - Primary  ASSISTANT: none  ANESTHESIA:   general  EBL: minimal  BLOOD ADMINISTERED:none  DRAINS: none   LOCAL MEDICATIONS USED:  NONE  SPECIMEN:  TDC tip for culture / gram stain  COUNTS: confirmed correct.  TOURNIQUET:  none  PATIENT DISPOSITION:  PACU - hemodynamically stable.   Delay start of Pharmacological VTE agent (>24hrs) due to surgical blood loss or risk of bleeding: no  INDICATION FOR PROCEDURE: [name] is a 31 y.o. female with ESRD. She has recently transitioned from PD to HD. She is dialyzing via [place]. She has developed staphylococcus bacteremia. After careful discussion of risks, benefits, and alternatives the patient was offered [facility] and PD catheter removal. The patient understood and wished to proceed.  OPERATIVE FINDINGS: Routine removal of TDC. Routine removal of PD catheter.  DESCRIPTION OF PROCEDURE: After identification of the patient in the pre-operative holding area, the patient was transferred to the operating room. The patient was positioned supine on the operating room table. Anesthesia was induced. The chest and abdomen were prepped and draped in standard fashion. A surgical pause was performed confirming correct patient, procedure, and operative location.  We began by removing the tunneled dialysis catheter.  Through the exit site in the chest careful sharp dissection was made around the felt cuff.  This was sharply freed from the surrounding soft tissue.  The catheter slid out intact.  The catheter tip was and sent for micro biologic evaluation.  Manual pressure was held on the jugular vein.   Hemostasis was achieved.  Next, the peritoneal dialysis catheter was evaluated with intraoperative ultrasound.  3 cuffs were identified in the typical location.  The Christmas tree adapter was identified in a typical location.  Small incisions were made over the sites to allow exposure.  A larger, transverse incision was necessary to free the cuff and the rectus fascia.  Cuffs were freed from the distal end of the catheter to the peritoneal end of the catheter.  Once the catheter was completely freed the catheter was cut to allow removal of the device without driving a cuff through the subcutaneous tissue.  The device was removed.  All pieces of the catheter were accounted for.  A small rent in the fascia was repaired with a 2-0 PDS suture.  The incisions were closed in layers using 3-0 Vicryl and 4-0 Monocryl.  Dermabond was applied.  Upon completion of the case instrument and sharps counts were confirmed correct. The patient was transferred to the PACU in good condition. I was present for all portions of the procedure.  Yevonne Aline. Stanford Breed, MD [facility] Phone Number: [phone] 07/05/2021 8:39 AM

## 2021-07-05 NOTE — Transfer of Care (Signed)
Immediate Anesthesia Transfer of Care Note  Patient: Barbara Donovan  Procedure(s) Performed: REMOVAL AMBULATORY PERITONEAL DIALYSIS (CAPD) CATHETER (Left: Abdomen) REMOVAL OF DIALYSIS CATHETER (Right: Chest)  Patient Location: PACU  Anesthesia Type:General  Level of Consciousness: awake, alert  and oriented  Airway & Oxygen Therapy: Patient Spontanous Breathing and Patient connected to face mask oxygen  Post-op Assessment: Report given to RN and Post -op Vital signs reviewed and stable  Post vital signs: Reviewed and stable  Last Vitals:  Vitals Value Taken Time  BP 107/69 07/05/21 0851  Temp    Pulse 90 07/05/21 0855  Resp 22 07/05/21 0855  SpO2 91 % 07/05/21 0855  Vitals shown include unvalidated device data.  Last Pain:  Vitals:   07/05/21 0652  TempSrc: Oral  PainSc:       Patients Stated Pain Goal: 4 (02/72/53 6644)  Complications: No notable events documented.

## 2021-07-05 NOTE — Plan of Care (Signed)
  Problem: Nutritional: Goal: Maintenance of adequate nutrition will improve Outcome: Progressing   Problem: Skin Integrity: Goal: Risk for impaired skin integrity will decrease Outcome: Progressing   Problem: Clinical Measurements: Goal: Respiratory complications will improve Outcome: Progressing   Problem: Activity: Goal: Risk for activity intolerance will decrease Outcome: Progressing   Problem: Nutrition: Goal: Adequate nutrition will be maintained Outcome: Progressing   Problem: Coping: Goal: Level of anxiety will decrease Outcome: Progressing   Problem: Elimination: Goal: Will not experience complications related to bowel motility Outcome: Progressing   Problem: Pain Managment: Goal: General experience of comfort will improve Outcome: Progressing   Problem: Safety: Goal: Ability to remain free from injury will improve Outcome: Progressing   Problem: Skin Integrity: Goal: Risk for impaired skin integrity will decrease Outcome: Progressing   Problem: Respiratory: Goal: Ability to maintain adequate ventilation will improve Outcome: Progressing

## 2021-07-05 NOTE — Anesthesia Procedure Notes (Signed)
Procedure Name: LMA Insertion Date/Time: 07/05/2021 7:47 AM  Performed by: Griffin Dakin, CRNAPre-anesthesia Checklist: Emergency Drugs available, Patient identified, Suction available, Patient being monitored and Timeout performed Patient Re-evaluated:Patient Re-evaluated prior to induction Oxygen Delivery Method: Circle system utilized Preoxygenation: Pre-oxygenation with 100% oxygen Induction Type: IV induction Ventilation: Mask ventilation without difficulty LMA: LMA inserted LMA Size: 4.0 Placement Confirmation: positive ETCO2 and breath sounds checked- equal and bilateral Tube secured with: Tape Dental Injury: Teeth and Oropharynx as per pre-operative assessment

## 2021-07-05 NOTE — Progress Notes (Signed)
[facility] [place] PROGRESS NOTE  ASSESSMENT / PLAN: SHAYLYNN NULTY is a 31 y.o. female with bacteremia. TDC and PD catheter in place. Plan to remove both in OR today and give a line holiday. Plan new access creation + TDC mid week (~07/08/21) as long as bacteremia clears.    SUBJECTIVE: No new issues. Ready for OR. All questions answered.  OBJECTIVE: BP (!) 142/82   Pulse 95   Temp 100.1 F (37.8 C) (Oral)   Resp 20   Ht 5' 5.98" (1.676 m)   Wt 66.4 kg   SpO2 93%   BMI 23.64 kg/m   Intake/Output Summary (Last 24 hours) at 07/05/2021 0731 Last data filed at 07/04/2021 1700 Gross per 24 hour  Intake 240 ml  Output 2500 ml  Net -2260 ml    No distress. TDC in place PD cath in place     Latest Ref Rng & Units 07/05/2021    5:55 AM 07/04/2021    6:06 AM 07/03/2021    4:24 AM  CBC  WBC 4.0 - 10.5 K/uL 13.0  12.3  13.2   Hemoglobin 12.0 - 15.0 g/dL 7.7  7.4  9.0   Hematocrit 36.0 - 46.0 % 22.6  22.2  27.0   Platelets 150 - 400 K/uL 236  167  153         Latest Ref Rng & Units 07/05/2021    5:55 AM 07/05/2021    1:48 AM 07/04/2021   10:09 PM  CMP  Glucose 70 - 99 mg/dL 161  179  145   BUN 6 - 20 mg/dL 44  40  33   Creatinine 0.44 - 1.00 mg/dL 6.81  6.54  6.31   Sodium 135 - 145 mmol/L 127  127  129   Potassium 3.5 - 5.1 mmol/L 3.6  3.7  3.7   Chloride 98 - 111 mmol/L 88  89  91   CO2 22 - 32 mmol/L 22  22  22    Calcium 8.9 - 10.3 mg/dL 8.4  8.2  8.4     Estimated Creatinine Clearance: 11.3 mL/min (A) (by C-G formula based on SCr of 6.81 mg/dL (H)).  Yevonne Aline. Stanford Breed, MD [facility] Phone Number: [phone] 07/05/2021 7:31 AM

## 2021-07-05 NOTE — Progress Notes (Signed)
HD#2 Subjective:  Overnight Events: No event overnight  Patient is seen after her operation.  She appears lethargic.  She reported improvement of her back pain and myalgia.  Endorses abdominal pain after removal of her peritoneal catheter.  Patient said that she has not had a menstrual period for 2 years.  Denies any overt bleeding.  Denies hematochezia or melena.  Denies epigastric pain or heartburn symptoms.  Objective:  Vital signs in last 24 hours: Vitals:   07/05/21 0005 07/05/21 0448 07/05/21 0458 07/05/21 0652  BP: 121/73  120/75 (!) 142/82  Pulse: 95 95 94 95  Resp:    20  Temp: 98.6 F (37 C)  99.5 F (37.5 C) 100.1 F (37.8 C)  TempSrc: Oral  Oral Oral  SpO2: 94% 94% 98% 93%  Weight:    66.4 kg  Height:    5' 5.98" (1.676 m)   Supplemental O2: Room Air SpO2: 93 % O2 Flow Rate (L/min): 1 L/min   Physical Exam:  Physical Exam Constitutional:      Appearance: She is ill-appearing and diaphoretic.  HENT:     Head: Normocephalic.  Eyes:     General:        Right eye: No discharge.        Left eye: No discharge.     Conjunctiva/sclera: Conjunctivae normal.  Cardiovascular:     Rate and Rhythm: Normal rate and regular rhythm.     Heart sounds: No murmur heard. Pulmonary:     Effort: Pulmonary effort is normal. No respiratory distress.  Abdominal:     Palpations: Abdomen is soft.     Tenderness: There is abdominal tenderness.  Musculoskeletal:        General: No swelling.  Skin:    General: Skin is warm.  Neurological:     Mental Status: She is alert.  Psychiatric:        Mood and Affect: Mood normal.     Filed Weights   07/02/21 2032 07/04/21 0944 07/05/21 0652  Weight: 65.8 kg 66.4 kg 66.4 kg     Intake/Output Summary (Last 24 hours) at 07/05/2021 0745 Last data filed at 07/04/2021 1700 Gross per 24 hour  Intake 240 ml  Output 2500 ml  Net -2260 ml   Net IO Since Admission: -1,938.84 mL [07/05/21 0745]  Pertinent Labs:    Latest Ref  Rng & Units 07/05/2021    5:55 AM 07/04/2021    6:06 AM 07/03/2021    4:24 AM  CBC  WBC 4.0 - 10.5 K/uL 13.0  12.3  13.2   Hemoglobin 12.0 - 15.0 g/dL 7.7  7.4  9.0   Hematocrit 36.0 - 46.0 % 22.6  22.2  27.0   Platelets 150 - 400 K/uL 236  167  153        Latest Ref Rng & Units 07/05/2021    5:55 AM 07/05/2021    1:48 AM 07/04/2021   10:09 PM  CMP  Glucose 70 - 99 mg/dL 161  179  145   BUN 6 - 20 mg/dL 44  40  33   Creatinine 0.44 - 1.00 mg/dL 6.81  6.54  6.31   Sodium 135 - 145 mmol/L 127  127  129   Potassium 3.5 - 5.1 mmol/L 3.6  3.7  3.7   Chloride 98 - 111 mmol/L 88  89  91   CO2 22 - 32 mmol/L 22  22  22    Calcium 8.9 - 10.3 mg/dL 8.4  8.2  8.4     Imaging: MR CERVICAL SPINE WO CONTRAST  Result Date: 07/05/2021 CLINICAL DATA:  31 year old female with end-stage renal disease with history of MSSA bacteremia, likely secondary to tunnel hemodialysis catheter. Generalized myalgias and back pain. Evaluate for infection. EXAM: MRI CERVICAL, THORACIC AND LUMBAR SPINE WITHOUT CONTRAST TECHNIQUE: Multiplanar and multiecho pulse sequences of the cervical spine, to include the craniocervical junction and cervicothoracic junction, and thoracic and lumbar spine, were obtained without intravenous contrast. COMPARISON:  Prior CT from 07/03/2021. FINDINGS: MRI CERVICAL SPINE FINDINGS Alignment: Straightening with mild reversal of the normal cervical lordosis. No listhesis. Vertebrae: Vertebral body height maintained without acute or chronic fracture. Diffusely decreased T1 signal intensity seen throughout the visualized bone marrow, likely related to patient history of end-stage renal disease and anemia. No discrete or worrisome osseous lesions. No abnormal marrow edema or inflammatory changes to suggest acute osteomyelitis discitis or septic arthritis. Cord: Normal signal and morphology. No epidural abscess or other collection. Posterior Fossa, vertebral arteries, paraspinal tissues: Visualized brain  and posterior fossa within normal limits. Craniocervical junction normal. Paraspinous soft tissues within normal limits. No acute inflammatory changes or loculated collections. Normal flow voids seen within the vertebral arteries bilaterally. Right-sided central venous catheter partially visualized. Disc levels: No significant disc pathology seen within the cervical spine. No stenosis or neural impingement. MRI THORACIC SPINE FINDINGS Alignment: Physiologic with preservation of the normal thoracic kyphosis. No listhesis. Vertebrae: Vertebral body height maintained without acute or chronic fracture. Diffusely decreased T1 signal intensity seen throughout the visualized bone marrow, again likely related to patient history of end-stage renal disease. Few scattered benign hemangiomata noted. No worrisome osseous lesions. No evidence for osteomyelitis discitis or septic arthritis within the thoracic spine. Cord: Normal signal morphology. No epidural abscess or other collection. Paraspinal and other soft tissues: Paraspinous soft tissues demonstrate no acute inflammatory changes. No loculated soft tissue collections. Small layering bilateral pleural effusions noted. Disc levels: No significant disc pathology seen within the thoracic spine. No stenosis or impingement. MRI LUMBAR SPINE FINDINGS Segmentation: Standard. Lowest well-formed disc space labeled the L5-S1 level. Alignment: Physiologic with preservation of the normal lumbar lordosis. No listhesis. Vertebrae: Vertebral body height maintained without acute or chronic fracture. Diffusely decreased T1 signal intensity seen throughout the visualized bone marrow related to patient's history of end-stage renal disease. Few scattered benign hemangiomata noted. No worrisome osseous lesions. No evidence for osteomyelitis discitis or septic arthritis. Conus medullaris and cauda equina: Conus extends to the a 1 level. Conus and cauda equina appear normal. No epidural abscess  or other collection. Paraspinal and other soft tissues: Paraspinous soft tissues demonstrate no acute inflammatory changes. No loculated soft tissue collections. Disc levels: L1-2:  Unremarkable. L2-3:  Unremarkable. L3-4: Normal interspace. Mild facet hypertrophy. No canal or foraminal stenosis. L4-5: Normal interspace. Mild facet hypertrophy. No significant spinal stenosis. Mild bilateral L4 foraminal narrowing. L5-S1: Normal interspace. Mild facet hypertrophy. No canal or significant foraminal stenosis. IMPRESSION: 1. No MRI evidence for acute infection within the cervical, thoracic, or lumbar spine. No epidural abscess or other inflammatory changes. 2. Mild lower lumbar facet hypertrophy.  No significant stenosis. 3. Small layering bilateral pleural effusions. Electronically Signed   By: Jeannine Boga M.D.   On: 07/05/2021 04:58   MR THORACIC SPINE WO CONTRAST  Result Date: 07/05/2021 CLINICAL DATA:  31 year old female with end-stage renal disease with history of MSSA bacteremia, likely secondary to tunnel hemodialysis catheter. Generalized myalgias and back pain. Evaluate for infection. EXAM: MRI  CERVICAL, THORACIC AND LUMBAR SPINE WITHOUT CONTRAST TECHNIQUE: Multiplanar and multiecho pulse sequences of the cervical spine, to include the craniocervical junction and cervicothoracic junction, and thoracic and lumbar spine, were obtained without intravenous contrast. COMPARISON:  Prior CT from 07/03/2021. FINDINGS: MRI CERVICAL SPINE FINDINGS Alignment: Straightening with mild reversal of the normal cervical lordosis. No listhesis. Vertebrae: Vertebral body height maintained without acute or chronic fracture. Diffusely decreased T1 signal intensity seen throughout the visualized bone marrow, likely related to patient history of end-stage renal disease and anemia. No discrete or worrisome osseous lesions. No abnormal marrow edema or inflammatory changes to suggest acute osteomyelitis discitis or septic  arthritis. Cord: Normal signal and morphology. No epidural abscess or other collection. Posterior Fossa, vertebral arteries, paraspinal tissues: Visualized brain and posterior fossa within normal limits. Craniocervical junction normal. Paraspinous soft tissues within normal limits. No acute inflammatory changes or loculated collections. Normal flow voids seen within the vertebral arteries bilaterally. Right-sided central venous catheter partially visualized. Disc levels: No significant disc pathology seen within the cervical spine. No stenosis or neural impingement. MRI THORACIC SPINE FINDINGS Alignment: Physiologic with preservation of the normal thoracic kyphosis. No listhesis. Vertebrae: Vertebral body height maintained without acute or chronic fracture. Diffusely decreased T1 signal intensity seen throughout the visualized bone marrow, again likely related to patient history of end-stage renal disease. Few scattered benign hemangiomata noted. No worrisome osseous lesions. No evidence for osteomyelitis discitis or septic arthritis within the thoracic spine. Cord: Normal signal morphology. No epidural abscess or other collection. Paraspinal and other soft tissues: Paraspinous soft tissues demonstrate no acute inflammatory changes. No loculated soft tissue collections. Small layering bilateral pleural effusions noted. Disc levels: No significant disc pathology seen within the thoracic spine. No stenosis or impingement. MRI LUMBAR SPINE FINDINGS Segmentation: Standard. Lowest well-formed disc space labeled the L5-S1 level. Alignment: Physiologic with preservation of the normal lumbar lordosis. No listhesis. Vertebrae: Vertebral body height maintained without acute or chronic fracture. Diffusely decreased T1 signal intensity seen throughout the visualized bone marrow related to patient's history of end-stage renal disease. Few scattered benign hemangiomata noted. No worrisome osseous lesions. No evidence for  osteomyelitis discitis or septic arthritis. Conus medullaris and cauda equina: Conus extends to the a 1 level. Conus and cauda equina appear normal. No epidural abscess or other collection. Paraspinal and other soft tissues: Paraspinous soft tissues demonstrate no acute inflammatory changes. No loculated soft tissue collections. Disc levels: L1-2:  Unremarkable. L2-3:  Unremarkable. L3-4: Normal interspace. Mild facet hypertrophy. No canal or foraminal stenosis. L4-5: Normal interspace. Mild facet hypertrophy. No significant spinal stenosis. Mild bilateral L4 foraminal narrowing. L5-S1: Normal interspace. Mild facet hypertrophy. No canal or significant foraminal stenosis. IMPRESSION: 1. No MRI evidence for acute infection within the cervical, thoracic, or lumbar spine. No epidural abscess or other inflammatory changes. 2. Mild lower lumbar facet hypertrophy.  No significant stenosis. 3. Small layering bilateral pleural effusions. Electronically Signed   By: Jeannine Boga M.D.   On: 07/05/2021 04:58   MR LUMBAR SPINE WO CONTRAST  Result Date: 07/05/2021 CLINICAL DATA:  31 year old female with end-stage renal disease with history of MSSA bacteremia, likely secondary to tunnel hemodialysis catheter. Generalized myalgias and back pain. Evaluate for infection. EXAM: MRI CERVICAL, THORACIC AND LUMBAR SPINE WITHOUT CONTRAST TECHNIQUE: Multiplanar and multiecho pulse sequences of the cervical spine, to include the craniocervical junction and cervicothoracic junction, and thoracic and lumbar spine, were obtained without intravenous contrast. COMPARISON:  Prior CT from 07/03/2021. FINDINGS: MRI CERVICAL SPINE FINDINGS Alignment:  Straightening with mild reversal of the normal cervical lordosis. No listhesis. Vertebrae: Vertebral body height maintained without acute or chronic fracture. Diffusely decreased T1 signal intensity seen throughout the visualized bone marrow, likely related to patient history of end-stage  renal disease and anemia. No discrete or worrisome osseous lesions. No abnormal marrow edema or inflammatory changes to suggest acute osteomyelitis discitis or septic arthritis. Cord: Normal signal and morphology. No epidural abscess or other collection. Posterior Fossa, vertebral arteries, paraspinal tissues: Visualized brain and posterior fossa within normal limits. Craniocervical junction normal. Paraspinous soft tissues within normal limits. No acute inflammatory changes or loculated collections. Normal flow voids seen within the vertebral arteries bilaterally. Right-sided central venous catheter partially visualized. Disc levels: No significant disc pathology seen within the cervical spine. No stenosis or neural impingement. MRI THORACIC SPINE FINDINGS Alignment: Physiologic with preservation of the normal thoracic kyphosis. No listhesis. Vertebrae: Vertebral body height maintained without acute or chronic fracture. Diffusely decreased T1 signal intensity seen throughout the visualized bone marrow, again likely related to patient history of end-stage renal disease. Few scattered benign hemangiomata noted. No worrisome osseous lesions. No evidence for osteomyelitis discitis or septic arthritis within the thoracic spine. Cord: Normal signal morphology. No epidural abscess or other collection. Paraspinal and other soft tissues: Paraspinous soft tissues demonstrate no acute inflammatory changes. No loculated soft tissue collections. Small layering bilateral pleural effusions noted. Disc levels: No significant disc pathology seen within the thoracic spine. No stenosis or impingement. MRI LUMBAR SPINE FINDINGS Segmentation: Standard. Lowest well-formed disc space labeled the L5-S1 level. Alignment: Physiologic with preservation of the normal lumbar lordosis. No listhesis. Vertebrae: Vertebral body height maintained without acute or chronic fracture. Diffusely decreased T1 signal intensity seen throughout the  visualized bone marrow related to patient's history of end-stage renal disease. Few scattered benign hemangiomata noted. No worrisome osseous lesions. No evidence for osteomyelitis discitis or septic arthritis. Conus medullaris and cauda equina: Conus extends to the a 1 level. Conus and cauda equina appear normal. No epidural abscess or other collection. Paraspinal and other soft tissues: Paraspinous soft tissues demonstrate no acute inflammatory changes. No loculated soft tissue collections. Disc levels: L1-2:  Unremarkable. L2-3:  Unremarkable. L3-4: Normal interspace. Mild facet hypertrophy. No canal or foraminal stenosis. L4-5: Normal interspace. Mild facet hypertrophy. No significant spinal stenosis. Mild bilateral L4 foraminal narrowing. L5-S1: Normal interspace. Mild facet hypertrophy. No canal or significant foraminal stenosis. IMPRESSION: 1. No MRI evidence for acute infection within the cervical, thoracic, or lumbar spine. No epidural abscess or other inflammatory changes. 2. Mild lower lumbar facet hypertrophy.  No significant stenosis. 3. Small layering bilateral pleural effusions. Electronically Signed   By: Jeannine Boga M.D.   On: 07/05/2021 04:58    Assessment/Plan:   Principal Problem:   MSSA bacteremia Active Problems:   Essential hypertension   DKA, type 1 ([place])   ESRD on hemodialysis [facility])   Patient Summary: Barbara Donovan is a 31 y.o. with a pertinent PMH of type 1 diabetes, ESRD on HD, who presented with feeling unwell and admitted for sepsis secondary to MSSA bacteremia.    Sepsis secondary to MSSA bacteremia Patient underwent PD and HD catheter removal procedure this morning.  Repeat blood culture sent this morning.  Fortunately, MRI without contrast of cervical/thoracic and lumbar was negative for any infection. -Continue IV cefazolin -Pending TTE.  No new murmur on exam.  She will likely need a TEE as well.  DKA-resolved Type 1 diabetes mellitus Her acidosis  and anion  gap resolved after hemodialysis which suggest a component of uremia on top of DKA.  Lactic acid remains within normal limits.  BMP this morning showed elevated anion gap with normal bicarb. -Continue checking BMP every 12 hours -Continue long-acting Semglee 20 units daily -Switch to q4h NovoLog today   End-stage renal disease on dialysis Monday Wednesday Friday.   -If repeat blood culture negative 48-72 hours, will place another HD catheter -Nephrology is following  Acute on chronic normocytic anemia At baseline hemoglobin around 9.  Hemoglobin has been trending down slowly during this admission.  Patient denies active menstruation or any signs of bleeding. -Check reticulocyte, ferritin and iron study -CBC in a.m.  Acute moderate hyponatremia In the setting of poor p.o. intake.  She appears euvolemic on exam -Encourage p.o. intake -Trend BMP   Gastroparesis Patient is not taking Reglan per med rec.  Can resume if becomes symptomatic.  Hypertension.   -Cont coreg  Anxiety/depression - Continue Prozac   Diet: Carb modified IVF: N/A VTE: Heparin Code: Full   Gaylan Gerold, DO 07/05/2021, 7:45 AM Pager: [phone]  Please contact the on call pager after 5 pm and on weekends at [phone].

## 2021-07-05 NOTE — Progress Notes (Signed)
[facility] Progress Note   Subjective:  Seen in room - s/p PD cath and TDC removal for line holiday this AM. C/o abdominal soreness, but appears comfortable. No CP/dyspnea, but remains on nasal O2.  Objective Vitals:   07/05/21 0652 07/05/21 0850 07/05/21 0905 07/05/21 0920  BP: (!) 142/82 107/69 105/65 112/68  Pulse: 95 90 89 88  Resp: 20 15 (!) 22 19  Temp: 100.1 F (37.8 C) 98.7 F (37.1 C)  (!) 100.6 F (38.1 C)  TempSrc: Oral     SpO2: 93% 99% (!) 88% 93%  Weight: 66.4 kg     Height: 5' 5.98" (1.676 m)      Physical Exam General: Drowsy but wakes for questions, NAD. Nasal O2 in place. Heart: RRR; no murmur Lungs: CTA anteriorly Abdomen: mildly tender abdomen, surgical glue over incision from PD cath removal Extremities: No LE edema Dialysis Access: none at the moment.  Additional Objective Labs: Basic Metabolic Panel: Recent Labs  Lab 07/03/21 0305 07/03/21 0424 07/05/21 0148 07/05/21 0555 07/05/21 0948  NA 124*   < > 127* 127* 126*  K 3.4*   < > 3.7 3.6 4.1  CL 90*   < > 89* 88* 89*  CO2 20*   < > 22 22 21*  GLUCOSE 239*   < > 179* 161* 169*  BUN 59*   < > 40* 44* 45*  CREATININE 8.79*   < > 6.54* 6.81* 7.09*  CALCIUM 8.9   < > 8.2* 8.4* 8.3*  PHOS 7.3*  --   --   --   --    < > = values in this interval not displayed.   Liver Function Tests: Recent Labs  Lab 07/02/21 2232 07/03/21 0424  AST 51* 16  ALT 20 16  ALKPHOS 70 57  BILITOT 1.3* 0.5  PROT 8.2* 6.8  ALBUMIN 3.5 2.9*   Recent Labs  Lab 07/02/21 2232  LIPASE 26   CBC: Recent Labs  Lab 07/02/21 2232 07/03/21 0024 07/03/21 0424 07/04/21 0606 07/05/21 0555  WBC 15.4* 14.6* 13.2* 12.3* 13.0*  NEUTROABS  --  12.3* 11.0*  --   --   HGB 10.7* 8.4* 9.0* 7.4* 7.7*  HCT 31.5* 24.9* 27.0* 22.2* 22.6*  MCV 83.1 83.6 83.6 83.1 83.1  PLT 169 145* 153 167 236   Blood Culture    Component Value Date/Time   SDES HEMODIALYSIS CATHETER 07/05/2021 0809   SPECREQUEST   07/05/2021 0809    ANTIBIOTIC TREATMENT ANCEF Performed at [facility], [address] [place], [place] [address]    CULT PENDING 07/05/2021 0809   REPTSTATUS PENDING 07/05/2021 0809   Studies/Results: MR CERVICAL SPINE WO CONTRAST  Result Date: 07/05/2021 CLINICAL DATA:  31 year old female with end-stage renal disease with history of MSSA bacteremia, likely secondary to tunnel hemodialysis catheter. Generalized myalgias and back pain. Evaluate for infection. EXAM: MRI CERVICAL, THORACIC AND LUMBAR SPINE WITHOUT CONTRAST TECHNIQUE: Multiplanar and multiecho pulse sequences of the cervical spine, to include the craniocervical junction and cervicothoracic junction, and thoracic and lumbar spine, were obtained without intravenous contrast. COMPARISON:  Prior CT from 07/03/2021. FINDINGS: MRI CERVICAL SPINE FINDINGS Alignment: Straightening with mild reversal of the normal cervical lordosis. No listhesis. Vertebrae: Vertebral body height maintained without acute or chronic fracture. Diffusely decreased T1 signal intensity seen throughout the visualized bone marrow, likely related to patient history of end-stage renal disease and anemia. No discrete or worrisome osseous lesions. No abnormal marrow edema or inflammatory changes  to suggest acute osteomyelitis discitis or septic arthritis. Cord: Normal signal and morphology. No epidural abscess or other collection. Posterior Fossa, vertebral arteries, paraspinal tissues: Visualized brain and posterior fossa within normal limits. Craniocervical junction normal. Paraspinous soft tissues within normal limits. No acute inflammatory changes or loculated collections. Normal flow voids seen within the vertebral arteries bilaterally. Right-sided central venous catheter partially visualized. Disc levels: No significant disc pathology seen within the cervical spine. No stenosis or neural impingement. MRI THORACIC SPINE FINDINGS Alignment: Physiologic with  preservation of the normal thoracic kyphosis. No listhesis. Vertebrae: Vertebral body height maintained without acute or chronic fracture. Diffusely decreased T1 signal intensity seen throughout the visualized bone marrow, again likely related to patient history of end-stage renal disease. Few scattered benign hemangiomata noted. No worrisome osseous lesions. No evidence for osteomyelitis discitis or septic arthritis within the thoracic spine. Cord: Normal signal morphology. No epidural abscess or other collection. Paraspinal and other soft tissues: Paraspinous soft tissues demonstrate no acute inflammatory changes. No loculated soft tissue collections. Small layering bilateral pleural effusions noted. Disc levels: No significant disc pathology seen within the thoracic spine. No stenosis or impingement. MRI LUMBAR SPINE FINDINGS Segmentation: Standard. Lowest well-formed disc space labeled the L5-S1 level. Alignment: Physiologic with preservation of the normal lumbar lordosis. No listhesis. Vertebrae: Vertebral body height maintained without acute or chronic fracture. Diffusely decreased T1 signal intensity seen throughout the visualized bone marrow related to patient's history of end-stage renal disease. Few scattered benign hemangiomata noted. No worrisome osseous lesions. No evidence for osteomyelitis discitis or septic arthritis. Conus medullaris and cauda equina: Conus extends to the a 1 level. Conus and cauda equina appear normal. No epidural abscess or other collection. Paraspinal and other soft tissues: Paraspinous soft tissues demonstrate no acute inflammatory changes. No loculated soft tissue collections. Disc levels: L1-2:  Unremarkable. L2-3:  Unremarkable. L3-4: Normal interspace. Mild facet hypertrophy. No canal or foraminal stenosis. L4-5: Normal interspace. Mild facet hypertrophy. No significant spinal stenosis. Mild bilateral L4 foraminal narrowing. L5-S1: Normal interspace. Mild facet hypertrophy.  No canal or significant foraminal stenosis. IMPRESSION: 1. No MRI evidence for acute infection within the cervical, thoracic, or lumbar spine. No epidural abscess or other inflammatory changes. 2. Mild lower lumbar facet hypertrophy.  No significant stenosis. 3. Small layering bilateral pleural effusions. Electronically Signed   By: Jeannine Boga M.D.   On: 07/05/2021 04:58   MR THORACIC SPINE WO CONTRAST  Result Date: 07/05/2021 CLINICAL DATA:  31 year old female with end-stage renal disease with history of MSSA bacteremia, likely secondary to tunnel hemodialysis catheter. Generalized myalgias and back pain. Evaluate for infection. EXAM: MRI CERVICAL, THORACIC AND LUMBAR SPINE WITHOUT CONTRAST TECHNIQUE: Multiplanar and multiecho pulse sequences of the cervical spine, to include the craniocervical junction and cervicothoracic junction, and thoracic and lumbar spine, were obtained without intravenous contrast. COMPARISON:  Prior CT from 07/03/2021. FINDINGS: MRI CERVICAL SPINE FINDINGS Alignment: Straightening with mild reversal of the normal cervical lordosis. No listhesis. Vertebrae: Vertebral body height maintained without acute or chronic fracture. Diffusely decreased T1 signal intensity seen throughout the visualized bone marrow, likely related to patient history of end-stage renal disease and anemia. No discrete or worrisome osseous lesions. No abnormal marrow edema or inflammatory changes to suggest acute osteomyelitis discitis or septic arthritis. Cord: Normal signal and morphology. No epidural abscess or other collection. Posterior Fossa, vertebral arteries, paraspinal tissues: Visualized brain and posterior fossa within normal limits. Craniocervical junction normal. Paraspinous soft tissues within normal limits. No acute inflammatory changes or  loculated collections. Normal flow voids seen within the vertebral arteries bilaterally. Right-sided central venous catheter partially visualized. Disc  levels: No significant disc pathology seen within the cervical spine. No stenosis or neural impingement. MRI THORACIC SPINE FINDINGS Alignment: Physiologic with preservation of the normal thoracic kyphosis. No listhesis. Vertebrae: Vertebral body height maintained without acute or chronic fracture. Diffusely decreased T1 signal intensity seen throughout the visualized bone marrow, again likely related to patient history of end-stage renal disease. Few scattered benign hemangiomata noted. No worrisome osseous lesions. No evidence for osteomyelitis discitis or septic arthritis within the thoracic spine. Cord: Normal signal morphology. No epidural abscess or other collection. Paraspinal and other soft tissues: Paraspinous soft tissues demonstrate no acute inflammatory changes. No loculated soft tissue collections. Small layering bilateral pleural effusions noted. Disc levels: No significant disc pathology seen within the thoracic spine. No stenosis or impingement. MRI LUMBAR SPINE FINDINGS Segmentation: Standard. Lowest well-formed disc space labeled the L5-S1 level. Alignment: Physiologic with preservation of the normal lumbar lordosis. No listhesis. Vertebrae: Vertebral body height maintained without acute or chronic fracture. Diffusely decreased T1 signal intensity seen throughout the visualized bone marrow related to patient's history of end-stage renal disease. Few scattered benign hemangiomata noted. No worrisome osseous lesions. No evidence for osteomyelitis discitis or septic arthritis. Conus medullaris and cauda equina: Conus extends to the a 1 level. Conus and cauda equina appear normal. No epidural abscess or other collection. Paraspinal and other soft tissues: Paraspinous soft tissues demonstrate no acute inflammatory changes. No loculated soft tissue collections. Disc levels: L1-2:  Unremarkable. L2-3:  Unremarkable. L3-4: Normal interspace. Mild facet hypertrophy. No canal or foraminal stenosis. L4-5:  Normal interspace. Mild facet hypertrophy. No significant spinal stenosis. Mild bilateral L4 foraminal narrowing. L5-S1: Normal interspace. Mild facet hypertrophy. No canal or significant foraminal stenosis. IMPRESSION: 1. No MRI evidence for acute infection within the cervical, thoracic, or lumbar spine. No epidural abscess or other inflammatory changes. 2. Mild lower lumbar facet hypertrophy.  No significant stenosis. 3. Small layering bilateral pleural effusions. Electronically Signed   By: Jeannine Boga M.D.   On: 07/05/2021 04:58   MR LUMBAR SPINE WO CONTRAST  Result Date: 07/05/2021 CLINICAL DATA:  31 year old female with end-stage renal disease with history of MSSA bacteremia, likely secondary to tunnel hemodialysis catheter. Generalized myalgias and back pain. Evaluate for infection. EXAM: MRI CERVICAL, THORACIC AND LUMBAR SPINE WITHOUT CONTRAST TECHNIQUE: Multiplanar and multiecho pulse sequences of the cervical spine, to include the craniocervical junction and cervicothoracic junction, and thoracic and lumbar spine, were obtained without intravenous contrast. COMPARISON:  Prior CT from 07/03/2021. FINDINGS: MRI CERVICAL SPINE FINDINGS Alignment: Straightening with mild reversal of the normal cervical lordosis. No listhesis. Vertebrae: Vertebral body height maintained without acute or chronic fracture. Diffusely decreased T1 signal intensity seen throughout the visualized bone marrow, likely related to patient history of end-stage renal disease and anemia. No discrete or worrisome osseous lesions. No abnormal marrow edema or inflammatory changes to suggest acute osteomyelitis discitis or septic arthritis. Cord: Normal signal and morphology. No epidural abscess or other collection. Posterior Fossa, vertebral arteries, paraspinal tissues: Visualized brain and posterior fossa within normal limits. Craniocervical junction normal. Paraspinous soft tissues within normal limits. No acute inflammatory  changes or loculated collections. Normal flow voids seen within the vertebral arteries bilaterally. Right-sided central venous catheter partially visualized. Disc levels: No significant disc pathology seen within the cervical spine. No stenosis or neural impingement. MRI THORACIC SPINE FINDINGS Alignment: Physiologic with preservation of the normal thoracic kyphosis.  No listhesis. Vertebrae: Vertebral body height maintained without acute or chronic fracture. Diffusely decreased T1 signal intensity seen throughout the visualized bone marrow, again likely related to patient history of end-stage renal disease. Few scattered benign hemangiomata noted. No worrisome osseous lesions. No evidence for osteomyelitis discitis or septic arthritis within the thoracic spine. Cord: Normal signal morphology. No epidural abscess or other collection. Paraspinal and other soft tissues: Paraspinous soft tissues demonstrate no acute inflammatory changes. No loculated soft tissue collections. Small layering bilateral pleural effusions noted. Disc levels: No significant disc pathology seen within the thoracic spine. No stenosis or impingement. MRI LUMBAR SPINE FINDINGS Segmentation: Standard. Lowest well-formed disc space labeled the L5-S1 level. Alignment: Physiologic with preservation of the normal lumbar lordosis. No listhesis. Vertebrae: Vertebral body height maintained without acute or chronic fracture. Diffusely decreased T1 signal intensity seen throughout the visualized bone marrow related to patient's history of end-stage renal disease. Few scattered benign hemangiomata noted. No worrisome osseous lesions. No evidence for osteomyelitis discitis or septic arthritis. Conus medullaris and cauda equina: Conus extends to the a 1 level. Conus and cauda equina appear normal. No epidural abscess or other collection. Paraspinal and other soft tissues: Paraspinous soft tissues demonstrate no acute inflammatory changes. No loculated soft  tissue collections. Disc levels: L1-2:  Unremarkable. L2-3:  Unremarkable. L3-4: Normal interspace. Mild facet hypertrophy. No canal or foraminal stenosis. L4-5: Normal interspace. Mild facet hypertrophy. No significant spinal stenosis. Mild bilateral L4 foraminal narrowing. L5-S1: Normal interspace. Mild facet hypertrophy. No canal or significant foraminal stenosis. IMPRESSION: 1. No MRI evidence for acute infection within the cervical, thoracic, or lumbar spine. No epidural abscess or other inflammatory changes. 2. Mild lower lumbar facet hypertrophy.  No significant stenosis. 3. Small layering bilateral pleural effusions. Electronically Signed   By: Jeannine Boga M.D.   On: 07/05/2021 04:58   Medications:   ceFAZolin (ANCEF) IV 1 g (07/04/21 2009)   dextrose 5 % and 0.45% NaCl 40 mL/hr at 07/04/21 1652   insulin 0.1 Units/hr (07/04/21 1836)    calcitRIOL  0.5 mcg Oral Daily   carvedilol  25 mg Oral BID   Chlorhexidine Gluconate Cloth  6 each Topical Q0600   ferrous sulfate  325 mg Oral Daily   FLUoxetine  40 mg Oral q morning   heparin  5,000 Units Subcutaneous Q8H   insulin aspart  0-6 Units Subcutaneous TID WC   insulin glargine-yfgn  20 Units Subcutaneous Q24H   sevelamer carbonate  800 mg Oral TID WC   sodium chloride flush  3 mL Intravenous Q12H    [facility] -> runs on Tu/W/F schedule, had recently changed from PD 3:45hr, EDW 63kg, 350/600, 3K/2.5Ca, RIJ TDC, heparin 2000 - Mircera 48mcg IV q 2 weeks (last 6/5)  Assessment/Plan: MSSA bacteremia: Fever/arthraglias/myalgias on admit. Blood Cx 6/16 growing MSSA. ID consulted, on Cefazolin. PD cath and [facility] pulled 6/19 for line holiday. Repeat Blood Cx 6/18 pending. Thoracic/LS MRI without discitis. Echo pending. T1DM/DKA: Required insulin drip on admit, now leveling out and back on subq insulin. ESRD: Previously failed CCPD, changed back to HD 1-2 mo ago. Doing better since changing modalities, appettie, N/V  improving. Dialyzed 6/17, next HD tentatively 6/20 - possibly can push if labs stable, as will need new TDC prior to next HD. HTN/volume: BP stable today, no edema on exam. May need to reduce Coreg dose. Anemia of ESRD: Hgb 7.7 - will be due for ESA with next dialysis. Will d/c PO iron, can give  with HD prn in future. Secondary hyperparathyroidism: Ca ok, last Phos high - continue home binders. On calcitriol daily -> change to HD dosing on discharge. Nutrition: Alb low, adding supplements.  Veneta Penton, PA-C 07/05/2021, 11:49 AM  [employer]

## 2021-07-06 ENCOUNTER — Inpatient Hospital Stay (HOSPITAL_COMMUNITY): Payer: 59

## 2021-07-06 ENCOUNTER — Encounter (HOSPITAL_COMMUNITY): Payer: Self-pay | Admitting: Vascular Surgery

## 2021-07-06 DIAGNOSIS — B9561 Methicillin susceptible Staphylococcus aureus infection as the cause of diseases classified elsewhere: Secondary | ICD-10-CM

## 2021-07-06 DIAGNOSIS — R7881 Bacteremia: Secondary | ICD-10-CM

## 2021-07-06 LAB — GLUCOSE, CAPILLARY
Glucose-Capillary: 105 mg/dL — ABNORMAL HIGH (ref 70–99)
Glucose-Capillary: 143 mg/dL — ABNORMAL HIGH (ref 70–99)
Glucose-Capillary: 134 mg/dL — ABNORMAL HIGH (ref 70–99)
Glucose-Capillary: 131 mg/dL — ABNORMAL HIGH (ref 70–99)
Glucose-Capillary: 132 mg/dL — ABNORMAL HIGH (ref 70–99)

## 2021-07-06 LAB — BASIC METABOLIC PANEL
Anion gap: 15 (ref 5–15)
BUN: 55 mg/dL — ABNORMAL HIGH (ref 6–20)
CO2: 22 mmol/L (ref 22–32)
Calcium: 9.3 mg/dL (ref 8.9–10.3)
Chloride: 89 mmol/L — ABNORMAL LOW (ref 98–111)
Creatinine, Ser: 8.44 mg/dL — ABNORMAL HIGH (ref 0.44–1.00)
GFR, Estimated: 6 mL/min — ABNORMAL LOW (ref >60)
Glucose, Bld: 141 mg/dL — ABNORMAL HIGH (ref 70–99)
Potassium: 3.7 mmol/L (ref 3.5–5.1)
Sodium: 126 mmol/L — ABNORMAL LOW (ref 135–145)

## 2021-07-06 LAB — CBC
HCT: 21.9 % — ABNORMAL LOW (ref 36.0–46.0)
Hemoglobin: 7.5 g/dL — ABNORMAL LOW (ref 12.0–15.0)
MCH: 28.4 pg (ref 26.0–34.0)
MCHC: 34.2 g/dL (ref 30.0–36.0)
MCV: 83 fL (ref 80.0–100.0)
Platelets: 308 10*3/uL (ref 150–400)
RBC: 2.64 MIL/uL — ABNORMAL LOW (ref 3.87–5.11)
RDW: 17.1 % — ABNORMAL HIGH (ref 11.5–15.5)
WBC: 13.4 10*3/uL — ABNORMAL HIGH (ref 4.0–10.5)
nRBC: 0 % (ref 0.0–0.2)

## 2021-07-06 LAB — BETA-HYDROXYBUTYRIC ACID: Beta-Hydroxybutyric Acid: 0.3 mmol/L — ABNORMAL HIGH (ref 0.05–0.27)

## 2021-07-06 LAB — CYCLIC CITRUL PEPTIDE ANTIBODY, IGG/IGA: CCP Antibodies IgG/IgA: 3 units (ref 0–19)

## 2021-07-06 MED ORDER — HYDROMORPHONE HCL 2 MG PO TABS
2.0000 mg | ORAL_TABLET | ORAL | Status: DC | PRN
  Administered 2021-07-06 – 2021-07-11 (×4): 2 mg via ORAL
  Filled 2021-07-06 (×4): qty 1

## 2021-07-06 MED ORDER — HYDROMORPHONE HCL 2 MG PO TABS
2.0000 mg | ORAL_TABLET | ORAL | Status: DC | PRN
  Administered 2021-07-06: 2 mg via ORAL
  Filled 2021-07-06: qty 1

## 2021-07-06 MED ORDER — HYDROMORPHONE HCL 1 MG/ML IJ SOLN
0.5000 mg | INTRAMUSCULAR | Status: DC | PRN
  Administered 2021-07-06: 0.5 mg via INTRAVENOUS
  Filled 2021-07-06: qty 0.5

## 2021-07-06 MED ORDER — INSULIN ASPART 100 UNIT/ML IJ SOLN
2.0000 [IU] | Freq: Three times a day (TID) | INTRAMUSCULAR | Status: DC
  Administered 2021-07-07 – 2021-07-10 (×7): 2 [IU] via SUBCUTANEOUS

## 2021-07-06 MED ORDER — INSULIN ASPART 100 UNIT/ML IJ SOLN
0.0000 [IU] | Freq: Three times a day (TID) | INTRAMUSCULAR | Status: DC
  Administered 2021-07-09 – 2021-07-10 (×4): 2 [IU] via SUBCUTANEOUS
  Administered 2021-07-11: 1 [IU] via SUBCUTANEOUS

## 2021-07-06 MED ORDER — HYDROMORPHONE HCL 1 MG/ML IJ SOLN
0.5000 mg | Freq: Four times a day (QID) | INTRAMUSCULAR | Status: DC | PRN
  Administered 2021-07-06 – 2021-07-09 (×2): 0.5 mg via INTRAVENOUS
  Filled 2021-07-06 (×2): qty 0.5

## 2021-07-06 NOTE — Progress Notes (Addendum)
[facility] Progress Note   Subjective: Seen at bedside. IV therapy present, attempting to start PIV which unfortunately is not going well. She has very small peripheral veins. Says she is tired more than anything else. Denies SOB.   Objective Vitals:   07/05/21 2109 07/06/21 0000 07/06/21 0413 07/06/21 0752  BP: 137/85 128/62 128/74 135/85  Pulse: 92  94 84  Resp: 15  15 13   Temp: 98.3 F (36.8 C) 98.7 F (37.1 C) 98.6 F (37 C) 98.6 F (37 C)  TempSrc: Oral Oral Oral Oral  SpO2: 95%  96% 97%  Weight:      Height:       Physical Exam General: Chronically ill appearing younger female in NAD Heart: S1,S2 RRR No M/R/G Lungs: CTAB, no WOB Abdomen: NT, NABS Extremities:No LE edema Dialysis Access: No HD access   Additional Objective Labs: Basic Metabolic Panel: Recent Labs  Lab 07/03/21 0305 07/03/21 0424 07/05/21 0948 07/05/21 1815 07/06/21 0543  NA 124*   < > 126* 127* 126*  K 3.4*   < > 4.1 3.6 3.7  CL 90*   < > 89* 86* 89*  CO2 20*   < > 21* 21* 22  GLUCOSE 239*   < > 169* 157* 141*  BUN 59*   < > 45* 49* 55*  CREATININE 8.79*   < > 7.09* 7.72* 8.44*  CALCIUM 8.9   < > 8.3* 9.1 9.3  PHOS 7.3*  --   --   --   --    < > = values in this interval not displayed.   Liver Function Tests: Recent Labs  Lab 07/02/21 2232 07/03/21 0424  AST 51* 16  ALT 20 16  ALKPHOS 70 57  BILITOT 1.3* 0.5  PROT 8.2* 6.8  ALBUMIN 3.5 2.9*   Recent Labs  Lab 07/02/21 2232  LIPASE 26   CBC: Recent Labs  Lab 07/03/21 0024 07/03/21 0424 07/04/21 0606 07/05/21 0555 07/06/21 0543  WBC 14.6* 13.2* 12.3* 13.0* 13.4*  NEUTROABS 12.3* 11.0*  --   --   --   HGB 8.4* 9.0* 7.4* 7.7* 7.5*  HCT 24.9* 27.0* 22.2* 22.6* 21.9*  MCV 83.6 83.6 83.1 83.1 83.0  PLT 145* 153 167 236 308   Blood Culture    Component Value Date/Time   SDES HEMODIALYSIS CATHETER 07/05/2021 0809   SPECREQUEST ANTIBIOTIC TREATMENT ANCEF 07/05/2021 0809   CULT PENDING 07/05/2021 0809    REPTSTATUS PENDING 07/05/2021 0809    Cardiac Enzymes: Recent Labs  Lab 07/03/21 0024  CKTOTAL 86   CBG: Recent Labs  Lab 07/05/21 1004 07/05/21 1300 07/05/21 1641 07/05/21 2109 07/06/21 0751  GLUCAP 174* 174* 157* 177* 143*   Iron Studies:  Recent Labs    07/05/21 0555  IRON 21*  TIBC 147*  FERRITIN 390*   @lablastinr3 @ Studies/Results: ECHOCARDIOGRAM COMPLETE  Result Date: 07/05/2021    ECHOCARDIOGRAM REPORT   Patient Name:   Barbara Donovan Date of Exam: 07/05/2021 Medical Rec #:  [id number]        Height:       66.0 in Accession #:    [id number]       Weight:       146.4 lb Date of Birth:  Jul 23, 1990        BSA:          1.751 m Patient Age:    30 years         BP:  117/68 mmHg Patient Gender: F                HR:           80 bpm. Exam Location:  Inpatient Procedure: 2D Echo, Cardiac Doppler and Color Doppler Indications:    Bacteremia  History:        Patient has prior history of Echocardiogram examinations, most                 recent 12/23/2017. Risk Factors:Diabetes.  Sonographer:    Joette Catching RCS Referring Phys: [id number] [place]  1. Left ventricular ejection fraction, by estimation, is 55 to 60%. The left ventricle has normal function. The left ventricle has no regional wall motion abnormalities. There is mild concentric left ventricular hypertrophy. Left ventricular diastolic parameters were normal.  2. Right ventricular systolic function is normal. The right ventricular size is normal. There is normal pulmonary artery systolic pressure.  3. Left atrial size was mildly dilated.  4. The mitral valve is normal in structure. Trivial mitral valve regurgitation. No evidence of mitral stenosis.  5. The aortic valve is tricuspid. Aortic valve regurgitation is not visualized. No aortic stenosis is present.  6. The inferior vena cava is normal in size with greater than 50% respiratory variability, suggesting right atrial pressure of 3 mmHg. FINDINGS  Left  Ventricle: Left ventricular ejection fraction, by estimation, is 55 to 60%. The left ventricle has normal function. The left ventricle has no regional wall motion abnormalities. The left ventricular internal cavity size was normal in size. There is  mild concentric left ventricular hypertrophy. Left ventricular diastolic parameters were normal. Indeterminate filling pressures. Right Ventricle: The right ventricular size is normal. No increase in right ventricular wall thickness. Right ventricular systolic function is normal. There is normal pulmonary artery systolic pressure. The tricuspid regurgitant velocity is 1.57 m/s, and  with an assumed right atrial pressure of 3 mmHg, the estimated right ventricular systolic pressure is 45.4 mmHg. Left Atrium: Left atrial size was mildly dilated. Right Atrium: Right atrial size was normal in size. Pericardium: Trivial pericardial effusion is present. Mitral Valve: The mitral valve is normal in structure. Trivial mitral valve regurgitation. No evidence of mitral valve stenosis. Tricuspid Valve: The tricuspid valve is normal in structure. Tricuspid valve regurgitation is trivial. No evidence of tricuspid stenosis. Aortic Valve: The aortic valve is tricuspid. Aortic valve regurgitation is not visualized. No aortic stenosis is present. Pulmonic Valve: The pulmonic valve was normal in structure. Pulmonic valve regurgitation is trivial. No evidence of pulmonic stenosis. Aorta: The aortic root is normal in size and structure. Venous: The inferior vena cava is normal in size with greater than 50% respiratory variability, suggesting right atrial pressure of 3 mmHg. IAS/Shunts: No atrial level shunt detected by color flow Doppler.  LEFT VENTRICLE PLAX 2D LVIDd:         4.70 cm   Diastology LVIDs:         3.20 cm   LV e' medial:    8.38 cm/s LV PW:         1.20 cm   LV E/e' medial:  10.6 LV IVS:        1.10 cm   LV e' lateral:   10.00 cm/s LVOT diam:     2.00 cm   LV E/e' lateral: 8.9  LVOT Area:     3.14 cm  RIGHT VENTRICLE             IVC RV  Basal diam:  2.50 cm     IVC diam: 1.40 cm RV Mid diam:    1.70 cm RV S prime:     12.70 cm/s TAPSE (M-mode): 2.0 cm LEFT ATRIUM             Index        RIGHT ATRIUM          Index LA diam:        3.00 cm 1.71 cm/m   RA Area:     7.56 cm LA Vol (A2C):   61.9 ml 35.34 ml/m  RA Volume:   11.90 ml 6.79 ml/m LA Vol (A4C):   42.8 ml 24.44 ml/m LA Biplane Vol: 55.3 ml 31.57 ml/m                        PULMONIC VALVE AORTA                 PV Vmax:          1.06 m/s Ao Root diam: 2.60 cm PV Peak grad:     4.5 mmHg Ao Asc diam:  2.70 cm PR End Diast Vel: 5.02 msec  MITRAL VALVE               TRICUSPID VALVE MV Area (PHT): 5.13 cm    TR Peak grad:   9.9 mmHg MV Decel Time: 148 msec    TR Vmax:        157.00 cm/s MV E velocity: 88.70 cm/s MV A velocity: 54.40 cm/s  SHUNTS MV E/A ratio:  1.63        Systemic Diam: 2.00 cm Skeet Latch MD Electronically signed by Skeet Latch MD Signature Date/Time: 07/05/2021/2:07:59 PM    Final    MR CERVICAL SPINE WO CONTRAST  Result Date: 07/05/2021 CLINICAL DATA:  31 year old female with end-stage renal disease with history of MSSA bacteremia, likely secondary to tunnel hemodialysis catheter. Generalized myalgias and back pain. Evaluate for infection. EXAM: MRI CERVICAL, THORACIC AND LUMBAR SPINE WITHOUT CONTRAST TECHNIQUE: Multiplanar and multiecho pulse sequences of the cervical spine, to include the craniocervical junction and cervicothoracic junction, and thoracic and lumbar spine, were obtained without intravenous contrast. COMPARISON:  Prior CT from 07/03/2021. FINDINGS: MRI CERVICAL SPINE FINDINGS Alignment: Straightening with mild reversal of the normal cervical lordosis. No listhesis. Vertebrae: Vertebral body height maintained without acute or chronic fracture. Diffusely decreased T1 signal intensity seen throughout the visualized bone marrow, likely related to patient history of end-stage renal disease  and anemia. No discrete or worrisome osseous lesions. No abnormal marrow edema or inflammatory changes to suggest acute osteomyelitis discitis or septic arthritis. Cord: Normal signal and morphology. No epidural abscess or other collection. Posterior Fossa, vertebral arteries, paraspinal tissues: Visualized brain and posterior fossa within normal limits. Craniocervical junction normal. Paraspinous soft tissues within normal limits. No acute inflammatory changes or loculated collections. Normal flow voids seen within the vertebral arteries bilaterally. Right-sided central venous catheter partially visualized. Disc levels: No significant disc pathology seen within the cervical spine. No stenosis or neural impingement. MRI THORACIC SPINE FINDINGS Alignment: Physiologic with preservation of the normal thoracic kyphosis. No listhesis. Vertebrae: Vertebral body height maintained without acute or chronic fracture. Diffusely decreased T1 signal intensity seen throughout the visualized bone marrow, again likely related to patient history of end-stage renal disease. Few scattered benign hemangiomata noted. No worrisome osseous lesions. No evidence for osteomyelitis discitis or septic arthritis within the thoracic spine. Cord: Normal signal  morphology. No epidural abscess or other collection. Paraspinal and other soft tissues: Paraspinous soft tissues demonstrate no acute inflammatory changes. No loculated soft tissue collections. Small layering bilateral pleural effusions noted. Disc levels: No significant disc pathology seen within the thoracic spine. No stenosis or impingement. MRI LUMBAR SPINE FINDINGS Segmentation: Standard. Lowest well-formed disc space labeled the L5-S1 level. Alignment: Physiologic with preservation of the normal lumbar lordosis. No listhesis. Vertebrae: Vertebral body height maintained without acute or chronic fracture. Diffusely decreased T1 signal intensity seen throughout the visualized bone marrow  related to patient's history of end-stage renal disease. Few scattered benign hemangiomata noted. No worrisome osseous lesions. No evidence for osteomyelitis discitis or septic arthritis. Conus medullaris and cauda equina: Conus extends to the a 1 level. Conus and cauda equina appear normal. No epidural abscess or other collection. Paraspinal and other soft tissues: Paraspinous soft tissues demonstrate no acute inflammatory changes. No loculated soft tissue collections. Disc levels: L1-2:  Unremarkable. L2-3:  Unremarkable. L3-4: Normal interspace. Mild facet hypertrophy. No canal or foraminal stenosis. L4-5: Normal interspace. Mild facet hypertrophy. No significant spinal stenosis. Mild bilateral L4 foraminal narrowing. L5-S1: Normal interspace. Mild facet hypertrophy. No canal or significant foraminal stenosis. IMPRESSION: 1. No MRI evidence for acute infection within the cervical, thoracic, or lumbar spine. No epidural abscess or other inflammatory changes. 2. Mild lower lumbar facet hypertrophy.  No significant stenosis. 3. Small layering bilateral pleural effusions. Electronically Signed   By: Jeannine Boga M.D.   On: 07/05/2021 04:58   MR THORACIC SPINE WO CONTRAST  Result Date: 07/05/2021 CLINICAL DATA:  31 year old female with end-stage renal disease with history of MSSA bacteremia, likely secondary to tunnel hemodialysis catheter. Generalized myalgias and back pain. Evaluate for infection. EXAM: MRI CERVICAL, THORACIC AND LUMBAR SPINE WITHOUT CONTRAST TECHNIQUE: Multiplanar and multiecho pulse sequences of the cervical spine, to include the craniocervical junction and cervicothoracic junction, and thoracic and lumbar spine, were obtained without intravenous contrast. COMPARISON:  Prior CT from 07/03/2021. FINDINGS: MRI CERVICAL SPINE FINDINGS Alignment: Straightening with mild reversal of the normal cervical lordosis. No listhesis. Vertebrae: Vertebral body height maintained without acute or  chronic fracture. Diffusely decreased T1 signal intensity seen throughout the visualized bone marrow, likely related to patient history of end-stage renal disease and anemia. No discrete or worrisome osseous lesions. No abnormal marrow edema or inflammatory changes to suggest acute osteomyelitis discitis or septic arthritis. Cord: Normal signal and morphology. No epidural abscess or other collection. Posterior Fossa, vertebral arteries, paraspinal tissues: Visualized brain and posterior fossa within normal limits. Craniocervical junction normal. Paraspinous soft tissues within normal limits. No acute inflammatory changes or loculated collections. Normal flow voids seen within the vertebral arteries bilaterally. Right-sided central venous catheter partially visualized. Disc levels: No significant disc pathology seen within the cervical spine. No stenosis or neural impingement. MRI THORACIC SPINE FINDINGS Alignment: Physiologic with preservation of the normal thoracic kyphosis. No listhesis. Vertebrae: Vertebral body height maintained without acute or chronic fracture. Diffusely decreased T1 signal intensity seen throughout the visualized bone marrow, again likely related to patient history of end-stage renal disease. Few scattered benign hemangiomata noted. No worrisome osseous lesions. No evidence for osteomyelitis discitis or septic arthritis within the thoracic spine. Cord: Normal signal morphology. No epidural abscess or other collection. Paraspinal and other soft tissues: Paraspinous soft tissues demonstrate no acute inflammatory changes. No loculated soft tissue collections. Small layering bilateral pleural effusions noted. Disc levels: No significant disc pathology seen within the thoracic spine. No stenosis or impingement. MRI  LUMBAR SPINE FINDINGS Segmentation: Standard. Lowest well-formed disc space labeled the L5-S1 level. Alignment: Physiologic with preservation of the normal lumbar lordosis. No  listhesis. Vertebrae: Vertebral body height maintained without acute or chronic fracture. Diffusely decreased T1 signal intensity seen throughout the visualized bone marrow related to patient's history of end-stage renal disease. Few scattered benign hemangiomata noted. No worrisome osseous lesions. No evidence for osteomyelitis discitis or septic arthritis. Conus medullaris and cauda equina: Conus extends to the a 1 level. Conus and cauda equina appear normal. No epidural abscess or other collection. Paraspinal and other soft tissues: Paraspinous soft tissues demonstrate no acute inflammatory changes. No loculated soft tissue collections. Disc levels: L1-2:  Unremarkable. L2-3:  Unremarkable. L3-4: Normal interspace. Mild facet hypertrophy. No canal or foraminal stenosis. L4-5: Normal interspace. Mild facet hypertrophy. No significant spinal stenosis. Mild bilateral L4 foraminal narrowing. L5-S1: Normal interspace. Mild facet hypertrophy. No canal or significant foraminal stenosis. IMPRESSION: 1. No MRI evidence for acute infection within the cervical, thoracic, or lumbar spine. No epidural abscess or other inflammatory changes. 2. Mild lower lumbar facet hypertrophy.  No significant stenosis. 3. Small layering bilateral pleural effusions. Electronically Signed   By: Jeannine Boga M.D.   On: 07/05/2021 04:58   MR LUMBAR SPINE WO CONTRAST  Result Date: 07/05/2021 CLINICAL DATA:  31 year old female with end-stage renal disease with history of MSSA bacteremia, likely secondary to tunnel hemodialysis catheter. Generalized myalgias and back pain. Evaluate for infection. EXAM: MRI CERVICAL, THORACIC AND LUMBAR SPINE WITHOUT CONTRAST TECHNIQUE: Multiplanar and multiecho pulse sequences of the cervical spine, to include the craniocervical junction and cervicothoracic junction, and thoracic and lumbar spine, were obtained without intravenous contrast. COMPARISON:  Prior CT from 07/03/2021. FINDINGS: MRI CERVICAL  SPINE FINDINGS Alignment: Straightening with mild reversal of the normal cervical lordosis. No listhesis. Vertebrae: Vertebral body height maintained without acute or chronic fracture. Diffusely decreased T1 signal intensity seen throughout the visualized bone marrow, likely related to patient history of end-stage renal disease and anemia. No discrete or worrisome osseous lesions. No abnormal marrow edema or inflammatory changes to suggest acute osteomyelitis discitis or septic arthritis. Cord: Normal signal and morphology. No epidural abscess or other collection. Posterior Fossa, vertebral arteries, paraspinal tissues: Visualized brain and posterior fossa within normal limits. Craniocervical junction normal. Paraspinous soft tissues within normal limits. No acute inflammatory changes or loculated collections. Normal flow voids seen within the vertebral arteries bilaterally. Right-sided central venous catheter partially visualized. Disc levels: No significant disc pathology seen within the cervical spine. No stenosis or neural impingement. MRI THORACIC SPINE FINDINGS Alignment: Physiologic with preservation of the normal thoracic kyphosis. No listhesis. Vertebrae: Vertebral body height maintained without acute or chronic fracture. Diffusely decreased T1 signal intensity seen throughout the visualized bone marrow, again likely related to patient history of end-stage renal disease. Few scattered benign hemangiomata noted. No worrisome osseous lesions. No evidence for osteomyelitis discitis or septic arthritis within the thoracic spine. Cord: Normal signal morphology. No epidural abscess or other collection. Paraspinal and other soft tissues: Paraspinous soft tissues demonstrate no acute inflammatory changes. No loculated soft tissue collections. Small layering bilateral pleural effusions noted. Disc levels: No significant disc pathology seen within the thoracic spine. No stenosis or impingement. MRI LUMBAR SPINE  FINDINGS Segmentation: Standard. Lowest well-formed disc space labeled the L5-S1 level. Alignment: Physiologic with preservation of the normal lumbar lordosis. No listhesis. Vertebrae: Vertebral body height maintained without acute or chronic fracture. Diffusely decreased T1 signal intensity seen throughout the visualized bone marrow related  to patient's history of end-stage renal disease. Few scattered benign hemangiomata noted. No worrisome osseous lesions. No evidence for osteomyelitis discitis or septic arthritis. Conus medullaris and cauda equina: Conus extends to the a 1 level. Conus and cauda equina appear normal. No epidural abscess or other collection. Paraspinal and other soft tissues: Paraspinous soft tissues demonstrate no acute inflammatory changes. No loculated soft tissue collections. Disc levels: L1-2:  Unremarkable. L2-3:  Unremarkable. L3-4: Normal interspace. Mild facet hypertrophy. No canal or foraminal stenosis. L4-5: Normal interspace. Mild facet hypertrophy. No significant spinal stenosis. Mild bilateral L4 foraminal narrowing. L5-S1: Normal interspace. Mild facet hypertrophy. No canal or significant foraminal stenosis. IMPRESSION: 1. No MRI evidence for acute infection within the cervical, thoracic, or lumbar spine. No epidural abscess or other inflammatory changes. 2. Mild lower lumbar facet hypertrophy.  No significant stenosis. 3. Small layering bilateral pleural effusions. Electronically Signed   By: Jeannine Boga M.D.   On: 07/05/2021 04:58   Medications:   ceFAZolin (ANCEF) IV Stopped (07/05/21 2057)    (feeding supplement) PROSource Plus  30 mL Oral BID BM   calcitRIOL  0.5 mcg Oral Daily   carvedilol  25 mg Oral BID   Chlorhexidine Gluconate Cloth  6 each Topical Q0600   FLUoxetine  40 mg Oral q morning   heparin  5,000 Units Subcutaneous Q8H   insulin glargine-yfgn  20 Units Subcutaneous Q24H   sevelamer carbonate  800 mg Oral TID WC   sodium chloride flush  3 mL  Intravenous Q12H     Dialysis Orders: [facility] -> runs on Tu/W/F schedule, had recently changed from PD 3:45hr, EDW 63kg, 350/600, 3K/2.5Ca, RIJ TDC,  -heparin 2000 units IV TIW - Mircera 28mcg IV q 2 weeks (last 6/5)   Assessment/Plan: MSSA bacteremia: Fever/arthraglias/myalgias on admit. Blood Cx 6/16 growing MSSA. ID consulted, on Cefazolin. PD cath and [facility] pulled 6/18 for line holiday. Repeat Blood Cx 6/18 NGTD X 1 day. Thoracic/LS MRI without discitis. Echo 07/05/2021 without mention of vegetation on valves.  T1DM/DKA: Required insulin drip on admit, now resolved.  Hyponatremia-Na 126. On fluoxetine 40 mg PO. No evidence of overt volume overload. If Na does not improve after HD resumed, may ask primary team to adjust medications.  ESRD: Previously failed CCPD, changed back to HD 1-2 mo ago. Doing better since changing modalities, appettie, N/V improving. Dialyzed 07/04/2021. TDC is out, possibly to be replaced 07/08/2021. Will order HD when [facility] has been replaced. K+ 3.7 co2 22 today.  HTN/volume: BP stable today, no edema on exam. BP controlled on lower side. May need to adjust carvedilol..  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 - will be due for ESA with next dialysis. Will d/c PO iron, can give with HD prn in future. Transfuse if HGB < 7.0 Secondary hyperparathyroidism: Ca ok, last Phos high - continue home binders. On calcitriol daily -> change to HD dosing on discharge. Nutrition: Alb low, adding supplements.  Rita H. Brown NP-C 07/06/2021, 11:15 AM  [employer] [phone]   Seen and examined independently.  Agree with note and exam as documented above by physician extender and as noted here.  Last HD 6/17 and 2.5 kg uf at that time.  Feels ok. Lines removed yesterday - PD and tunn catheter  General adult female in bed in no acute distress HEENT normocephalic atraumatic extraocular movements intact sclera anicteric Neck supple trachea midline Lungs clear to  auscultation bilaterally normal work of breathing at rest. On room air and  lying flat Heart S1S2 no rub Abdomen soft nontender nondistended Extremities no edema  Psych normal mood and affect Neuro - alert and oriented x 3 provides hx and follows commands  Access - tunn catheter is out  MSSA bacteremia - line holiday currently. Tunneled catheter and PD catheter removed on 6/18 with [facility].  Plan for replacing tunn catheter on 6/21 per [facility] - this should be ok. Appreciate ID. On cefazolin  ESRD - Plan for replacing tunn catheter on 6/21 per [facility] - this should be ok.  HD on 6/21 after line placement  Hyponatremia - noted.  Off HD for line holiday. If continues after resuming HD would need to adjust SSRI  Anemia CKD - ESA due next HD as above  Claudia Desanctis, MD 07/06/2021 12:00 PM

## 2021-07-06 NOTE — Plan of Care (Signed)
  Problem: Education: Goal: Knowledge of General Education information will improve Description: Including pain rating scale, medication(s)/side effects and non-pharmacologic comfort measures Outcome: Progressing   Problem: Clinical Measurements: Goal: Ability to maintain clinical measurements within normal limits will improve Outcome: Progressing   Problem: Activity: Goal: Risk for activity intolerance will decrease Outcome: Progressing   Problem: Nutrition: Goal: Adequate nutrition will be maintained Outcome: Progressing   Problem: Coping: Goal: Level of anxiety will decrease Outcome: Progressing   Problem: Elimination: Goal: Will not experience complications related to bowel motility Outcome: Progressing   Problem: Pain Managment: Goal: General experience of comfort will improve Outcome: Progressing   

## 2021-07-06 NOTE — Evaluation (Addendum)
Physical Therapy Evaluation Patient Details Name: KEYARA ENT MRN: [id number] DOB: Oct 04, 1990 Today's Date: 07/06/2021  History of Present Illness  31 year old female with past medical history of diabetes mellitus type 1 complicated by diabetic polyneuropathy and diabetic retinopathy, end-stage renal disease on hemodialysis Monday Wednesday and Fridays, hypertension and major depressive disorder presented to [facility] emergency department via EMS with complaints of bilateral lower extremity pain. Upon evaluation in the emergency department patient has been found to exhibit multiple SIRS criteria including fever of 103.1, tachycardia, tachypnea and leukocytosis.  Chest x-ray revealed no obvious evidence of pneumonia.  Patient was additionally found to be in diabetic ketoacidosis and therefore was initiated on an insulin infusion.  The hospitalist group has been called to assess the patient for admission for DKA and SIRS with possible sepsis. 6/18 had removal of dialysis catheters. Plan to replace on 6/20.    Clinical Impression  Patient received in bed, she speaks with very low volume. Agrees to PT session. She required min A for bed mobility and min A for transfers. Ambulated 30 feet with RW and min A. She will continue to benefit from skilled PT to improve strength, independence and safety with mobility.         Recommendations for follow up therapy are one component of a multi-disciplinary discharge planning process, led by the attending physician.  Recommendations may be updated based on patient status, additional functional criteria and insurance authorization.  Follow Up Recommendations No PT follow up    Assistance Recommended at Discharge PRN  Patient can return home with the following  Assist for transportation;Help with stairs or ramp for entrance;A little help with walking and/or transfers;A little help with bathing/dressing/bathroom;Assistance with cooking/housework     Equipment Recommendations Rolling walker (2 wheels)  Recommendations for Other Services       Functional Status Assessment Patient has had a recent decline in their functional status and demonstrates the ability to make significant improvements in function in a reasonable and predictable amount of time.     Precautions / Restrictions Precautions Precautions: Fall Precaution Comments: mod fall Restrictions Weight Bearing Restrictions: No      Mobility  Bed Mobility Overal bed mobility: Needs Assistance Bed Mobility: Supine to Sit     Supine to sit: Min assist, HOB elevated     General bed mobility comments: min A to bring LEs off bed ( R LE more painful than left)    Transfers Overall transfer level: Needs assistance Equipment used: Rolling walker (2 wheels) Transfers: Sit to/from Stand Sit to Stand: Min guard                Ambulation/Gait Ambulation/Gait assistance: Min guard Gait Distance (Feet): 30 Feet Assistive device: Rolling walker (2 wheels) Gait Pattern/deviations: Step-through pattern, Decreased step length - right, Decreased step length - left, Decreased stride length, Trunk flexed Gait velocity: decr     General Gait Details: slow, laborious. Heavy use of B UEs on walker  Stairs            Wheelchair Mobility    Modified Rankin (Stroke Patients Only)       Balance Overall balance assessment: Modified Independent Sitting-balance support: Feet supported Sitting balance-Leahy Scale: Good     Standing balance support: Bilateral upper extremity supported, During functional activity, Reliant on assistive device for balance Standing balance-Leahy Scale: Fair Standing balance comment: weak/painful LEs  Pertinent Vitals/Pain Pain Assessment Pain Assessment: Faces Faces Pain Scale: Hurts little more Pain Location: B hips/thighs, abdomen Pain Descriptors / Indicators: Discomfort, Grimacing,  Guarding Pain Intervention(s): Monitored during session, Repositioned, RN gave pain meds during session    [place] expects to be discharged to:: Private residence Living Arrangements: Spouse/significant other Available Help at Discharge: Family;Available PRN/intermittently Type of Home: House       Alternate Level Stairs-Number of Steps: flight Home Layout: Two level Home Equipment: None      Prior Function Prior Level of Function : Independent/Modified Independent                     Hand Dominance        Extremity/Trunk Assessment   Upper Extremity Assessment Upper Extremity Assessment: Generalized weakness    Lower Extremity Assessment Lower Extremity Assessment: Generalized weakness    Cervical / Trunk Assessment Cervical / Trunk Assessment: Normal  Communication   Communication: No difficulties  Cognition Arousal/Alertness: Awake/alert, Lethargic Behavior During Therapy: Flat affect Overall Cognitive Status: No family/caregiver present to determine baseline cognitive functioning                                          General Comments      Exercises     Assessment/Plan    PT Assessment Patient needs continued PT services  PT Problem List Decreased strength;Decreased mobility;Decreased activity tolerance;Decreased balance;Pain;Decreased knowledge of use of DME       PT Treatment Interventions DME instruction;Therapeutic exercise;Gait training;Balance training;Stair training;Functional mobility training;Therapeutic activities;Patient/family education    PT Goals (Current goals can be found in the Care Plan section)  Acute Rehab PT Goals Patient Stated Goal: to get better PT Goal Formulation: With patient Time For Goal Achievement: 07/17/21 Potential to Achieve Goals: Good    Frequency Min 3X/week     Co-evaluation               AM-PAC PT "6 Clicks" Mobility  Outcome Measure Help needed  turning from your back to your side while in a flat bed without using bedrails?: A Little Help needed moving from lying on your back to sitting on the side of a flat bed without using bedrails?: A Little Help needed moving to and from a bed to a chair (including a wheelchair)?: A Little Help needed standing up from a chair using your arms (e.g., wheelchair or bedside chair)?: A Little Help needed to walk in hospital room?: A Little Help needed climbing 3-5 steps with a railing? : A Lot 6 Click Score: 17    End of Session Equipment Utilized During Treatment: Gait belt Activity Tolerance: Patient limited by pain;Patient limited by fatigue Patient left: in chair;with call bell/phone within reach Nurse Communication: Mobility status PT Visit Diagnosis: Other abnormalities of gait and mobility (R26.89);Difficulty in walking, not elsewhere classified (R26.2);Muscle weakness (generalized) (M62.81);Pain Pain - Right/Left: Right Pain - part of body: Leg    Time: 1311-1340 PT Time Calculation (min) (ACUTE ONLY): 29 min   Charges:   PT Evaluation $PT Eval Moderate Complexity: 1 Mod PT Treatments $Gait Training: 8-22 mins        Jahvier Aldea, PT, GCS 07/06/21,1:51 PM

## 2021-07-06 NOTE — Progress Notes (Signed)
[facility] for Infectious Disease   Reason for visit: Follow up on bacteremia  Interval History: dialysis catheter removed.  WBC 13.4, afebrile > 24 hours.   Day 4 total antibiotics  Physical Exam: Constitutional:  Vitals:   07/06/21 0752 07/06/21 1208  BP: 135/85 (!) 157/93  Pulse: 84 89  Resp: 13 14  Temp: 98.6 F (37 C) 98.6 F (37 C)  SpO2: 97% 97%   patient appears in NAD Respiratory: Normal respiratory effort  Review of Systems: Constitutional: positive for fatigue or negative for fevers and chills  Lab Results  Component Value Date   WBC 13.4 (H) 07/06/2021   HGB 7.5 (L) 07/06/2021   HCT 21.9 (L) 07/06/2021   MCV 83.0 07/06/2021   PLT 308 07/06/2021    Lab Results  Component Value Date   CREATININE 8.44 (H) 07/06/2021   BUN 55 (H) 07/06/2021   NA 126 (L) 07/06/2021   K 3.7 07/06/2021   CL 89 (L) 07/06/2021   CO2 22 07/06/2021    Lab Results  Component Value Date   ALT 16 07/03/2021   AST 16 07/03/2021   ALKPHOS 57 07/03/2021     Microbiology: Recent Results (from the past 240 hour(s))  Culture, blood (Routine X 2) w Reflex to ID Panel     Status: Abnormal   Collection Time: 07/03/21  3:48 AM   Specimen: BLOOD RIGHT WRIST  Result Value Ref Range Status   Specimen Description   Final    BLOOD RIGHT WRIST Performed at [facility], [place] [address] [place], [place] [address]    Special Requests   Final    BOTTLES DRAWN AEROBIC AND ANAEROBIC Blood Culture adequate volume Performed at [facility] [address], [place], [place] [address]    Culture  Setup Time   Final    GRAM POSITIVE COCCI IN CLUSTERS IN BOTH AEROBIC AND ANAEROBIC BOTTLES CRITICAL RESULT CALLED TO, READ BACK BY AND VERIFIED WITH: PHARMD GRACE BARR ON 07/03/21 @ 1750 BY DRT    Culture (A)  Final    STAPHYLOCOCCUS AUREUS SUSCEPTIBILITIES PERFORMED ON PREVIOUS CULTURE WITHIN THE LAST 5 DAYS. Performed at [facility],  [place] [address], [place], [place] [address]    Report Status 07/05/2021 FINAL  Final  Culture, blood (Routine X 2) w Reflex to ID Panel     Status: Abnormal   Collection Time: 07/03/21  4:15 AM   Specimen: BLOOD  Result Value Ref Range Status   Specimen Description   Final    BLOOD RIGHT ANTECUBITAL Performed at [facility] [address] [place], [place] [address]    Special Requests   Final    BOTTLES DRAWN AEROBIC AND ANAEROBIC Blood Culture adequate volume Performed at [facility] [address] [place], [place] [address]    Culture  Setup Time   Final    GRAM POSITIVE COCCI IN CLUSTERS IN BOTH AEROBIC AND ANAEROBIC BOTTLES CRITICAL RESULT CALLED TO, READ BACK BY AND VERIFIED WITH: PHARMD GRACE BARR ON 07/03/21 @ 1749 BY DRT Performed at [facility], [place] [address], [place],  [address]    Culture STAPHYLOCOCCUS AUREUS (A)  Final   Report Status 07/05/2021 FINAL  Final   Organism ID, Bacteria STAPHYLOCOCCUS AUREUS  Final      Susceptibility   Staphylococcus aureus - MIC*    CIPROFLOXACIN <=0.5 SENSITIVE Sensitive     ERYTHROMYCIN >=8 RESISTANT Resistant  GENTAMICIN <=0.5 SENSITIVE Sensitive     OXACILLIN <=0.25 SENSITIVE Sensitive     TETRACYCLINE >=16 RESISTANT Resistant     VANCOMYCIN 1 SENSITIVE Sensitive     TRIMETH/SULFA <=10 SENSITIVE Sensitive     CLINDAMYCIN <=0.25 SENSITIVE Sensitive     RIFAMPIN <=0.5 SENSITIVE Sensitive     Inducible Clindamycin NEGATIVE Sensitive     * STAPHYLOCOCCUS AUREUS  Blood Culture ID Panel (Reflexed)     Status: Abnormal   Collection Time: 07/03/21  4:15 AM  Result Value Ref Range Status   Enterococcus faecalis NOT DETECTED NOT DETECTED Final   Enterococcus Faecium NOT DETECTED NOT DETECTED Final   Listeria monocytogenes NOT DETECTED NOT DETECTED Final   Staphylococcus species DETECTED (A) NOT DETECTED Final    Comment: CRITICAL RESULT CALLED TO, READ BACK BY AND VERIFIED  WITH: PHARMD GRACE BARR ON 07/03/21 @ 1749 BY DRT    Staphylococcus aureus (BCID) DETECTED (A) NOT DETECTED Final    Comment: CRITICAL RESULT CALLED TO, READ BACK BY AND VERIFIED WITH: PHARMD GRACE BARR ON 07/03/21 @ 1749 BY DRT    Staphylococcus epidermidis NOT DETECTED NOT DETECTED Final   Staphylococcus lugdunensis NOT DETECTED NOT DETECTED Final   Streptococcus species NOT DETECTED NOT DETECTED Final   Streptococcus agalactiae NOT DETECTED NOT DETECTED Final   Streptococcus pneumoniae NOT DETECTED NOT DETECTED Final   Streptococcus pyogenes NOT DETECTED NOT DETECTED Final   A.calcoaceticus-baumannii NOT DETECTED NOT DETECTED Final   Bacteroides fragilis NOT DETECTED NOT DETECTED Final   Enterobacterales NOT DETECTED NOT DETECTED Final   Enterobacter cloacae complex NOT DETECTED NOT DETECTED Final   Escherichia coli NOT DETECTED NOT DETECTED Final   Klebsiella aerogenes NOT DETECTED NOT DETECTED Final   Klebsiella oxytoca NOT DETECTED NOT DETECTED Final   Klebsiella pneumoniae NOT DETECTED NOT DETECTED Final   Proteus species NOT DETECTED NOT DETECTED Final   Salmonella species NOT DETECTED NOT DETECTED Final   Serratia marcescens NOT DETECTED NOT DETECTED Final   Haemophilus influenzae NOT DETECTED NOT DETECTED Final   Neisseria meningitidis NOT DETECTED NOT DETECTED Final   Pseudomonas aeruginosa NOT DETECTED NOT DETECTED Final   Stenotrophomonas maltophilia NOT DETECTED NOT DETECTED Final   Candida albicans NOT DETECTED NOT DETECTED Final   Candida auris NOT DETECTED NOT DETECTED Final   Candida glabrata NOT DETECTED NOT DETECTED Final   Candida krusei NOT DETECTED NOT DETECTED Final   Candida parapsilosis NOT DETECTED NOT DETECTED Final   Candida tropicalis NOT DETECTED NOT DETECTED Final   Cryptococcus neoformans/gattii NOT DETECTED NOT DETECTED Final   Meth resistant mecA/C and MREJ NOT DETECTED NOT DETECTED Final    Comment: Performed at [facility], [address] [place],  [address]  SARS Coronavirus 2 by RT PCR (hospital order, performed in [facility] hospital lab) *cepheid single result test* Anterior Nasal Swab     Status: None   Collection Time: 07/03/21  6:30 AM   Specimen: Anterior Nasal Swab  Result Value Ref Range Status   SARS Coronavirus 2 by RT PCR NEGATIVE NEGATIVE Final    Comment: (NOTE) SARS-CoV-2 target nucleic acids are NOT DETECTED.  The SARS-CoV-2 RNA is generally detectable in upper and lower respiratory specimens during the acute phase of infection. The lowest concentration of SARS-CoV-2 viral copies this assay can detect is 250 copies / mL. A negative result does not preclude SARS-CoV-2 infection and should not be used as the sole basis for treatment or other patient management decisions.  A negative result may occur with improper specimen collection / handling, submission of specimen other than nasopharyngeal swab, presence of viral mutation(s) within the areas targeted by this assay, and inadequate number of viral copies (<250 copies / mL). A negative result must be combined with clinical observations, patient history, and epidemiological information.  Fact Sheet for Patients:   https://www.patel.info/  Fact Sheet for Healthcare Providers: https://hall.com/  This test is not yet approved or  cleared by the [place] FDA and has been authorized for detection and/or diagnosis of SARS-CoV-2 by FDA under an Emergency Use Authorization (EUA).  This EUA will remain in effect (meaning this test can be used) for the duration of the COVID-19 declaration under Section 564(b)(1) of the Act, 21 U.S.C. section 360bbb-3(b)(1), unless the authorization is terminated or revoked sooner.  Performed at [facility], [place] [address], [place], [place] [address]   Culture, [place] Urine     Status: None   Collection Time: 07/03/21 11:18 AM   Specimen: Urine, Random   Result Value Ref Range Status   Specimen Description   Final    URINE, RANDOM Performed at [facility] [address] [place], [place] [address]    Special Requests   Final    NONE Performed at [facility], [place] [address], [place], [place] [address]    Culture   Final    NO GROWTH NO GROUP B STREP (S.AGALACTIAE) ISOLATED Performed at [facility], [place] [address] [place], [place] [address]    Report Status 07/04/2021 FINAL  Final  Culture, blood (Routine X 2) w Reflex to ID Panel     Status: None (Preliminary result)   Collection Time: 07/05/21  1:48 AM   Specimen: BLOOD RIGHT HAND  Result Value Ref Range Status   Specimen Description BLOOD RIGHT HAND  Final   Special Requests   Final    AEROBIC BOTTLE ONLY Blood Culture results may not be optimal due to an inadequate volume of blood received in culture bottles   Culture   Final    NO GROWTH 1 DAY Performed at [facility], [place] [address], [place], [place] [address]    Report Status PENDING  Incomplete  Culture, blood (Routine X 2) w Reflex to ID Panel     Status: None (Preliminary result)   Collection Time: 07/05/21  1:59 AM   Specimen: BLOOD LEFT HAND  Result Value Ref Range Status   Specimen Description BLOOD LEFT HAND  Final   Special Requests   Final    AEROBIC BOTTLE ONLY Blood Culture results may not be optimal due to an inadequate volume of blood received in culture bottles   Culture   Final    NO GROWTH 1 DAY Performed at [facility], [place] [address], [place], [place] [address]    Report Status PENDING  Incomplete  Aerobic/Anaerobic Culture w Gram Stain (surgical/deep wound)     Status: None (Preliminary result)   Collection Time: 07/05/21  8:09 AM   Specimen: PATH Other; Tissue  Result Value Ref Range Status   Specimen Description HEMODIALYSIS CATHETER  Final   Special Requests ANTIBIOTIC TREATMENT ANCEF  Final   Gram Stain   Final    RARE WBC  PRESENT,BOTH PMN AND MONONUCLEAR RARE GRAM POSITIVE COCCI IN PAIRS Performed at [facility], [place] [address] [place], [place] [address]    Culture PENDING  Incomplete   Report Status PENDING  Incomplete    Impression/Plan:  1. MSSA bacteremia - HD catheter in place and removed by vascular surgery yesterday for a line holiday.  ON cefazolin and no issues.   TTE without any obvious vegetation. I recommend a TEE to rule out endocarditis.  Scheduled for 6/21.  Repeat blood cultures sent.   GPC on culture from catheter.     2.  ESRD - on intermittent hemodialysis and getting antibiotics after dialysis.  Duration depending on above.    3.  Back pain - MRI of spine without any infectious concerns.

## 2021-07-06 NOTE — Plan of Care (Signed)
No acute events overnight.  Problem: Education: Goal: Ability to describe self-care measures that may prevent or decrease complications (Diabetes Survival Skills Education) will improve Outcome: Progressing Goal: Individualized Educational Video(s) Outcome: Progressing   Problem: Coping: Goal: Ability to adjust to condition or change in health will improve Outcome: Progressing   Problem: Fluid Volume: Goal: Ability to maintain a balanced intake and output will improve Outcome: Progressing   Problem: Health Behavior/Discharge Planning: Goal: Ability to identify and utilize available resources and services will improve Outcome: Progressing Goal: Ability to manage health-related needs will improve Outcome: Progressing   Problem: Metabolic: Goal: Ability to maintain appropriate glucose levels will improve Outcome: Progressing   Problem: Nutritional: Goal: Maintenance of adequate nutrition will improve Outcome: Progressing Goal: Progress toward achieving an optimal weight will improve Outcome: Progressing   Problem: Skin Integrity: Goal: Risk for impaired skin integrity will decrease Outcome: Progressing   Problem: Tissue Perfusion: Goal: Adequacy of tissue perfusion will improve Outcome: Progressing   Problem: Education: Goal: Knowledge of General Education information will improve Description: Including pain rating scale, medication(s)/side effects and non-pharmacologic comfort measures Outcome: Progressing   Problem: Health Behavior/Discharge Planning: Goal: Ability to manage health-related needs will improve Outcome: Progressing   Problem: Clinical Measurements: Goal: Ability to maintain clinical measurements within normal limits will improve Outcome: Progressing Goal: Will remain free from infection Outcome: Progressing Goal: Diagnostic test results will improve Outcome: Progressing Goal: Respiratory complications will improve Outcome: Progressing Goal:  Cardiovascular complication will be avoided Outcome: Progressing   Problem: Activity: Goal: Risk for activity intolerance will decrease Outcome: Progressing   Problem: Nutrition: Goal: Adequate nutrition will be maintained Outcome: Progressing   Problem: Coping: Goal: Level of anxiety will decrease Outcome: Progressing   Problem: Elimination: Goal: Will not experience complications related to bowel motility Outcome: Progressing Goal: Will not experience complications related to urinary retention Outcome: Progressing   Problem: Pain Managment: Goal: General experience of comfort will improve Outcome: Progressing   Problem: Safety: Goal: Ability to remain free from injury will improve Outcome: Progressing   Problem: Skin Integrity: Goal: Risk for impaired skin integrity will decrease Outcome: Progressing   Problem: Fluid Volume: Goal: Hemodynamic stability will improve Outcome: Progressing   Problem: Clinical Measurements: Goal: Diagnostic test results will improve Outcome: Progressing Goal: Signs and symptoms of infection will decrease Outcome: Progressing   Problem: Respiratory: Goal: Ability to maintain adequate ventilation will improve Outcome: Progressing   

## 2021-07-06 NOTE — Care Management Important Message (Signed)
Important Message  Patient Details  Name: Barbara Donovan MRN: [id number] Date of Birth: Feb 06, 1990   Medicare Important Message Given:  Yes     Memory Argue 07/06/2021, 2:51 PM

## 2021-07-06 NOTE — Progress Notes (Signed)
Subjective: Patient seen and evaluated at bedside.  She endorsed appetite.  Denies chest pain, shortness of breath, subjective fever or chills.  She still having pain around her dialysis site, line was removed yesterday.  No worsening of the pain but quite tender with movement.  Objective:  Vital signs in last 24 hours: Vitals:   07/05/21 2109 07/06/21 0000 07/06/21 0413 07/06/21 0752  BP: 137/85 128/62 128/74 135/85  Pulse: 92  94 84  Resp: 15  15 13   Temp: 98.3 F (36.8 C) 98.7 F (37.1 C) 98.6 F (37 C) 98.6 F (37 C)  TempSrc: Oral Oral Oral Oral  SpO2: 95%  96% 97%  Weight:      Height:       Physical Exam HENT:     Head: Normocephalic and atraumatic.  Cardiovascular:     Rate and Rhythm: Normal rate.     Heart sounds: Normal heart sounds.  Pulmonary:     Effort: Pulmonary effort is normal.     Breath sounds: Normal breath sounds and air entry.  Abdominal:     General: Bowel sounds are normal.     Palpations: Abdomen is soft.     Comments: 3 wounds where previous catheter lines were present.  Wound is clean and dry.  Tender to touch.  Musculoskeletal:     Right lower leg: No edema.     Left lower leg: No edema.  Skin:    General: Skin is warm and dry.  Neurological:     General: No focal deficit present.     Mental Status: She is alert and oriented to person, place, and time.  Psychiatric:        Behavior: Behavior is cooperative.      Assessment/Plan:  Principal Problem:   MSSA bacteremia Active Problems:   Essential hypertension   DKA, type 1 ([place])   ESRD on hemodialysis ([place])   MSSA bacteremia Vascular team removed HD/PD catheter yesterday.  No evidence of endocarditis on TTE.  MRI imaging of cervical/thoracic/lumbar spine shows no infection, which is reassuring.  Patient denies any symptoms at this time.  Patient will need TEE.  Preliminary blood culture shows no growth to date.  Blood cultures will need to be negative for at least 48 to 72 hours  prior to new HD placement. -Follow-up blood cultures -Continue IV cefazolin 1g Q24H -TEE scheduled earliest for Wednesday (6/21) per cardiology  ESRD Last dialysis session 07/04/2021.  Upon being found to be bacteremic, HD/PD catheters were removed yesterday by vascular team.  Tenderness noted around catheter removal site.  Wounds clean and dry.  Pending blood cultures, if negative results in 48-72 hours, HD line can be placed, anticipated HD replacement 07/08/2021. -Nephrology following, recommendations appreciated -Vascular following, recommendations appreciated -Follow-up blood cultures -Anticipated HD replacement-07/08/2021 -Pain control: IV diluadid 0.5mg  Q4H PRN and oral diluadid 2mg  Q3H PRN  Type 1 diabetes mellitus DKA, resolved Anion gap closed.  Patient is tolerating diet.  Glucose levels within reference range.  VBG, no acidosis noted. -Semglee 20 units Q24H - SSI with meal coverage  Anemia of chronic disease Hemoglobin steady at 7.5.  No active signs of bleeding.  Likely secondary to her ESRD.  Hopeful with EPO with dialysis hemoglobin will improve.  As stated above, pending blood cultures to determine next HD placement/HD session.  Secondary hyperparathyroidism Calcium levels acceptable; phosphate levels elevated.  Nephrology following. -Continue phosphate binders -Continue calcitriol daily  Gastroparesis Home medication includes Reglan.  She denies any  episodes of vomiting since admission.  If she becomes symptomatic, can resume her home medication Reglan.  Hypertension Patient is normotensive. - Continue Coreg  Anxiety/depression - Continue Prozac   Prior to Admission Living Arrangement: Anticipated Discharge Location: Barriers to Discharge: Dispo: Anticipated discharge in approximately 1-2 day(s).   Timothy Lasso, MD 07/06/2021, 11:55 AM Pager: [phone] After 5pm on weekdays and 1pm on weekends: On Call pager [phone]

## 2021-07-07 ENCOUNTER — Inpatient Hospital Stay (HOSPITAL_COMMUNITY): Payer: 59

## 2021-07-07 DIAGNOSIS — N186 End stage renal disease: Secondary | ICD-10-CM

## 2021-07-07 DIAGNOSIS — I120 Hypertensive chronic kidney disease with stage 5 chronic kidney disease or end stage renal disease: Secondary | ICD-10-CM

## 2021-07-07 LAB — BASIC METABOLIC PANEL
Anion gap: 19 — ABNORMAL HIGH (ref 5–15)
BUN: 68 mg/dL — ABNORMAL HIGH (ref 6–20)
CO2: 21 mmol/L — ABNORMAL LOW (ref 22–32)
Calcium: 9.5 mg/dL (ref 8.9–10.3)
Chloride: 89 mmol/L — ABNORMAL LOW (ref 98–111)
Creatinine, Ser: 9.85 mg/dL — ABNORMAL HIGH (ref 0.44–1.00)
GFR, Estimated: 5 mL/min — ABNORMAL LOW (ref >60)
Glucose, Bld: 87 mg/dL (ref 70–99)
Potassium: 3.9 mmol/L (ref 3.5–5.1)
Sodium: 129 mmol/L — ABNORMAL LOW (ref 135–145)

## 2021-07-07 LAB — CBC
HCT: 21.8 % — ABNORMAL LOW (ref 36.0–46.0)
Hemoglobin: 7.5 g/dL — ABNORMAL LOW (ref 12.0–15.0)
MCH: 28.3 pg (ref 26.0–34.0)
MCHC: 34.4 g/dL (ref 30.0–36.0)
MCV: 82.3 fL (ref 80.0–100.0)
Platelets: 409 10*3/uL — ABNORMAL HIGH (ref 150–400)
RBC: 2.65 MIL/uL — ABNORMAL LOW (ref 3.87–5.11)
RDW: 16.9 % — ABNORMAL HIGH (ref 11.5–15.5)
WBC: 15 10*3/uL — ABNORMAL HIGH (ref 4.0–10.5)
nRBC: 0 % (ref 0.0–0.2)

## 2021-07-07 LAB — GLUCOSE, CAPILLARY
Glucose-Capillary: 99 mg/dL (ref 70–99)
Glucose-Capillary: 90 mg/dL (ref 70–99)
Glucose-Capillary: 98 mg/dL (ref 70–99)
Glucose-Capillary: 84 mg/dL (ref 70–99)
Glucose-Capillary: 164 mg/dL — ABNORMAL HIGH (ref 70–99)

## 2021-07-07 LAB — PHOSPHORUS: Phosphorus: 6.4 mg/dL — ABNORMAL HIGH (ref 2.5–4.6)

## 2021-07-07 LAB — ALBUMIN: Albumin: 2 g/dL — ABNORMAL LOW (ref 3.5–5.0)

## 2021-07-07 MED ORDER — CALCITRIOL 0.5 MCG PO CAPS
0.5000 ug | ORAL_CAPSULE | ORAL | Status: DC
  Filled 2021-07-07 (×3): qty 1

## 2021-07-07 MED ORDER — AMLODIPINE BESYLATE 10 MG PO TABS
10.0000 mg | ORAL_TABLET | Freq: Every day | ORAL | Status: DC
  Administered 2021-07-07 – 2021-07-10 (×3): 10 mg via ORAL
  Filled 2021-07-07 (×3): qty 1

## 2021-07-07 MED ORDER — DARBEPOETIN ALFA 100 MCG/0.5ML IJ SOSY: 100.0000 ug | PREFILLED_SYRINGE | INTRAMUSCULAR | Status: DC

## 2021-07-07 MED ORDER — HYDRALAZINE HCL 50 MG PO TABS
50.0000 mg | ORAL_TABLET | Freq: Two times a day (BID) | ORAL | Status: DC
  Administered 2021-07-07 – 2021-07-11 (×7): 50 mg via ORAL
  Filled 2021-07-07 (×7): qty 1

## 2021-07-07 MED ORDER — INSULIN GLARGINE-YFGN 100 UNIT/ML ~~LOC~~ SOLN
10.0000 [IU] | SUBCUTANEOUS | Status: DC
  Administered 2021-07-07 – 2021-07-10 (×4): 10 [IU] via SUBCUTANEOUS
  Filled 2021-07-07 (×7): qty 0.1

## 2021-07-07 NOTE — Progress Notes (Addendum)
At d/c, pt will receive out-pt HD at [facility] on Monday, Tuesday, Friday at 10:45 (chair) until [facility] has a chair to become available. Update provided to nephrologist. Contacted pt via phone and left a message requesting a return call to discuss details. Will await return call from pt. Will assist as needed.   Melven Sartorius Renal Navigator [phone]  Addendum at 4:04 pm: Spoke to pt via phone to go over out-pt HD details at d/c. Appts added to pt's AVS as well.

## 2021-07-07 NOTE — Progress Notes (Signed)
Physical Therapy Treatment Patient Details Name: NATIKA GEYER MRN: [id number] DOB: 02-Nov-1990 Today's Date: 07/07/2021   History of Present Illness 31 year old female with past medical history of diabetes mellitus type 1 complicated by diabetic polyneuropathy and diabetic retinopathy, end-stage renal disease on hemodialysis Monday Wednesday and Fridays, hypertension and major depressive disorder presented to [facility] emergency department via EMS with complaints of bilateral lower extremity pain. Upon evaluation in the emergency department patient has been found to exhibit multiple SIRS criteria including fever of 103.1, tachycardia, tachypnea and leukocytosis.  Chest x-ray revealed no obvious evidence of pneumonia.  Patient was additionally found to be in diabetic ketoacidosis and therefore was initiated on an insulin infusion.  The hospitalist group has been called to assess the patient for admission for DKA and SIRS with possible sepsis.    PT Comments    Continuing work on functional mobility and activity tolerance;  Pt agreeing to walk this afternoon; Slow steps, and quite fatigued;  Pt did not reported pre-syncopal symptoms; still, noted SBP drop of 20 mmHg between supine and standing;   07/07/21 1323  Vital Signs  Patient Position (if appropriate) Orthostatic Vitals  Orthostatic Lying   BP- Lying 156/88  Orthostatic Sitting  BP- Sitting 145/87  Orthostatic Standing at 0 minutes  BP- Standing at 0 minutes 136/86  Orthostatic Standing at 3 minutes  BP- Standing at 3 minutes 133/85     Recommendations for follow up therapy are one component of a multi-disciplinary discharge planning process, led by the attending physician.  Recommendations may be updated based on patient status, additional functional criteria and insurance authorization.  Follow Up Recommendations  No PT follow up     Assistance Recommended at Discharge PRN  Patient can return home with the following  Assist for transportation;Help with stairs or ramp for entrance;A little help with walking and/or transfers;A little help with bathing/dressing/bathroom;Assistance with cooking/housework   Equipment Recommendations  Rolling walker (2 wheels)    Recommendations for Other Services       Precautions / Restrictions Precautions Precautions: Fall     Mobility  Bed Mobility Overal bed mobility: Needs Assistance Bed Mobility: Supine to Sit     Supine to sit: Supervision, HOB elevated     General bed mobility comments: increased time though no assist needed    Transfers Overall transfer level: Needs assistance Equipment used: Rolling walker (2 wheels) Transfers: Sit to/from Stand Sit to Stand: Supervision           General transfer comment: Cues for hand placement    Ambulation/Gait Ambulation/Gait assistance: Min guard (with and without physical contact ) Gait Distance (Feet): 45 Feet Assistive device: Rolling walker (2 wheels) Gait Pattern/deviations: Step-through pattern, Decreased step length - right, Decreased step length - left, Decreased stride length, Trunk flexed       General Gait Details: Adjusted RW for proper fit; slow, laborious steps. Heavy use of B UEs on walker; Cues to self-monitor for activity tolerance    Stairs             Wheelchair Mobility    Modified Rankin (Stroke Patients Only)       Balance Overall balance assessment: Mild deficits observed, not formally tested   Sitting balance-Leahy Scale: Good       Standing balance-Leahy Scale: Fair Standing balance comment: weak/painful LEs  Cognition Arousal/Alertness: Awake/alert Behavior During Therapy: Flat affect Overall Cognitive Status: No family/caregiver present to determine baseline cognitive functioning                                 General Comments: did not formally assess. flat affect throughout but follows all  directions, may be distracted by reported pain        Exercises      General Comments General comments (skin integrity, edema, etc.): Pt did not reported pre-syncopal symptoms; still, noted SBP drop of 20 mmHg between supine and standing;   07/07/21 1323  Vital Signs  Patient Position (if appropriate) Orthostatic Vitals  Orthostatic Lying   BP- Lying 156/88  Orthostatic Sitting  BP- Sitting 145/87  Orthostatic Standing at 0 minutes  BP- Standing at 0 minutes 136/86  Orthostatic Standing at 3 minutes  BP- Standing at 3 minutes 133/85         Pertinent Vitals/Pain Pain Assessment Pain Assessment: Faces Faces Pain Scale: Hurts little more Pain Location: B LE, back Pain Descriptors / Indicators: Grimacing, Guarding Pain Intervention(s): Monitored during session    Home Living                          Prior Function            PT Goals (current goals can now be found in the care plan section) Acute Rehab PT Goals Patient Stated Goal: to get better PT Goal Formulation: With patient Time For Goal Achievement: 07/17/21 Potential to Achieve Goals: Good Progress towards PT goals: Progressing toward goals    Frequency    Min 3X/week      PT Plan Current plan remains appropriate    Co-evaluation              AM-PAC PT "6 Clicks" Mobility   Outcome Measure  Help needed turning from your back to your side while in a flat bed without using bedrails?: A Little Help needed moving from lying on your back to sitting on the side of a flat bed without using bedrails?: A Little Help needed moving to and from a bed to a chair (including a wheelchair)?: A Little Help needed standing up from a chair using your arms (e.g., wheelchair or bedside chair)?: A Little Help needed to walk in hospital room?: A Little Help needed climbing 3-5 steps with a railing? : A Lot 6 Click Score: 17    End of Session Equipment Utilized During Treatment: Gait belt Activity  Tolerance: Patient limited by pain;Patient limited by fatigue Patient left: in bed;with call bell/phone within reach Nurse Communication: Mobility status PT Visit Diagnosis: Other abnormalities of gait and mobility (R26.89);Difficulty in walking, not elsewhere classified (R26.2);Muscle weakness (generalized) (M62.81);Pain Pain - Right/Left: Right Pain - part of body: Leg     Time: 6270-3500 PT Time Calculation (min) (ACUTE ONLY): 17 min  Charges:  $Gait Training: 8-22 mins                     Roney Marion, [place] Office [phone]    Colletta [place] 07/07/2021, 5:11 PM

## 2021-07-07 NOTE — Progress Notes (Signed)
Subjective: Patient seen and evaluated at bedside. She reports improvement in her abd pain and lower leg pain. She worked with PT yesterday and movement helped her pain. She denies N/V, chest pain or SOB.  Objective:  Vital signs in last 24 hours: Vitals:   07/06/21 1632 07/06/21 2008 07/07/21 0032 07/07/21 0755  BP: (!) 169/102 (!) 162/91 (!) 151/90 (!) 166/95  Pulse: 93   88  Resp: 13 15 15 15   Temp:  98.5 F (36.9 C)  98.3 F (36.8 C)  TempSrc: Oral Oral  Oral  SpO2: 98% 97% 95% 97%  Weight:      Height:       Physical Exam HENT:     Head: Normocephalic and atraumatic.  Cardiovascular:     Rate and Rhythm: Normal rate and regular rhythm.     Heart sounds: Normal heart sounds.  Pulmonary:     Effort: Pulmonary effort is normal.     Breath sounds: Normal breath sounds and air entry.  Abdominal:     Tenderness: There is no abdominal tenderness.     Comments: Superficial wounds of the abd are clean and dry.  Skin:    General: Skin is warm and dry.  Neurological:     General: No focal deficit present.     Mental Status: She is alert and oriented to person, place, and time.  Psychiatric:        Behavior: Behavior normal. Behavior is cooperative.      Assessment/Plan:  Principal Problem:   MSSA bacteremia Active Problems:   Essential hypertension   DKA, type 1 ([place])   ESRD on hemodialysis ([place])  Barbara Donovan is a 31 y.o. with a pertinent PMH of type 1 diabetes, ESRD on HD, who presented with feeling unwell and admitted for sepsis secondary to MSSA bacteremia.   MSSA bacteremia Though blood sample only sufficient to culture aerobics, still no growth to date. Per ID, no indication to repeat blood cultures as a result of this, patient is cleared to have HD placement tomorrow (07/08/21). Patient is also scheduled for TEE tomorrow (07/08/21). CBC significant for slight worsening of leukocytosis, however pt is afebrile and normal HR. Clinically, she appears to have  improved since admission. -Continue IV cefazolin -Follow up TEE -Follow up HD placement -NPO at midnight  ESRD Last hemodialysis session 07/04/21. Patient will need HD in the upcoming days. For now, her electrolytes are acceptable however her creatinine levels continues to rise. Patient is A&Ox3 and showing no sign of neurologic decline. With negative blood cultures, patient is a candidate for line placement tomorrow (07/08/21) as stated above. -Nephrology following, recommendations appreciated -Vascular following, recommendations appreciated -Follow up HD placement -Pain control with diluadid  Hypertension Patient BP noted to be elevated. Patient is asymptomatic. --Amlodipine 10mg  daily --Hydralazine 50mg  BID - Coreg 25mg  BID  Type 1 diabetes mellitus Patient is tolerating diet.  Glucose levels within reference range -Semglee 20 units Q24H - SSI with meal coverage --NPO at midnight as stated above  Anemia of chronic disease Hemoglobin steady at ~7.5.  No active signs of bleeding.  Likely secondary to her ESRD.  Hopeful with EPO with dialysis hemoglobin will improve.     Secondary hyperparathyroidism Calcium levels acceptable; phosphate levels elevated.  Nephrology following. -Continue phosphate binders -Continue calcitriol daily   Anxiety/depression - Continue Prozac  Gastroparesis Home medication includes Reglan.  She denies any episodes of vomiting since admission.  If she becomes symptomatic, can resume her home medication  Reglan.    Prior to Admission Living Arrangement: Anticipated Discharge Location: Barriers to Discharge: Dispo: Anticipated discharge in approximately 1-4 day(s).   Timothy Lasso, MD 07/07/2021, 11:17 AM Pager: [phone] After 5pm on weekdays and 1pm on weekends: On Call pager [phone]

## 2021-07-07 NOTE — H&P (View-Only) (Signed)
Subjective: Patient seen and evaluated at bedside. She reports improvement in her abd pain and lower leg pain. She worked with PT yesterday and movement helped her pain. She denies N/V, chest pain or SOB.  Objective:  Vital signs in last 24 hours: Vitals:   07/06/21 1632 07/06/21 2008 07/07/21 0032 07/07/21 0755  BP: (!) 169/102 (!) 162/91 (!) 151/90 (!) 166/95  Pulse: 93   88  Resp: 13 15 15 15   Temp:  98.5 F (36.9 C)  98.3 F (36.8 C)  TempSrc: Oral Oral  Oral  SpO2: 98% 97% 95% 97%  Weight:      Height:       Physical Exam HENT:     Head: Normocephalic and atraumatic.  Cardiovascular:     Rate and Rhythm: Normal rate and regular rhythm.     Heart sounds: Normal heart sounds.  Pulmonary:     Effort: Pulmonary effort is normal.     Breath sounds: Normal breath sounds and air entry.  Abdominal:     Tenderness: There is no abdominal tenderness.     Comments: Superficial wounds of the abd are clean and dry.  Skin:    General: Skin is warm and dry.  Neurological:     General: No focal deficit present.     Mental Status: She is alert and oriented to person, place, and time.  Psychiatric:        Behavior: Behavior normal. Behavior is cooperative.      Assessment/Plan:  Principal Problem:   MSSA bacteremia Active Problems:   Essential hypertension   DKA, type 1 ([place])   ESRD on hemodialysis ([place])  Barbara Donovan is a 31 y.o. with a pertinent PMH of type 1 diabetes, ESRD on HD, who presented with feeling unwell and admitted for sepsis secondary to MSSA bacteremia.   MSSA bacteremia Though blood sample only sufficient to culture aerobics, still no growth to date. Per ID, no indication to repeat blood cultures as a result of this, patient is cleared to have HD placement tomorrow (07/08/21). Patient is also scheduled for TEE tomorrow (07/08/21). CBC significant for slight worsening of leukocytosis, however pt is afebrile and normal HR. Clinically, she appears to have  improved since admission. -Continue IV cefazolin -Follow up TEE -Follow up HD placement -NPO at midnight  ESRD Last hemodialysis session 07/04/21. Patient will need HD in the upcoming days. For now, her electrolytes are acceptable however her creatinine levels continues to rise. Patient is A&Ox3 and showing no sign of neurologic decline. With negative blood cultures, patient is a candidate for line placement tomorrow (07/08/21) as stated above. -Nephrology following, recommendations appreciated -Vascular following, recommendations appreciated -Follow up HD placement -Pain control with diluadid  Hypertension Patient BP noted to be elevated. Patient is asymptomatic. --Amlodipine 10mg  daily --Hydralazine 50mg  BID - Coreg 25mg  BID  Type 1 diabetes mellitus Patient is tolerating diet.  Glucose levels within reference range -Semglee 20 units Q24H - SSI with meal coverage --NPO at midnight as stated above  Anemia of chronic disease Hemoglobin steady at ~7.5.  No active signs of bleeding.  Likely secondary to her ESRD.  Hopeful with EPO with dialysis hemoglobin will improve.     Secondary hyperparathyroidism Calcium levels acceptable; phosphate levels elevated.  Nephrology following. -Continue phosphate binders -Continue calcitriol daily   Anxiety/depression - Continue Prozac  Gastroparesis Home medication includes Reglan.  She denies any episodes of vomiting since admission.  If she becomes symptomatic, can resume her home medication  Reglan.    Prior to Admission Living Arrangement: Anticipated Discharge Location: Barriers to Discharge: Dispo: Anticipated discharge in approximately 1-4 day(s).   Barbara Lasso, MD 07/07/2021, 11:17 AM Pager: [phone] After 5pm on weekdays and 1pm on weekends: On Call pager [phone]

## 2021-07-07 NOTE — Evaluation (Signed)
Occupational Therapy Evaluation Patient Details Name: Barbara Donovan MRN: [id number] DOB: 08/25/1990 Today's Date: 07/07/2021   History of Present Illness 31 year old female with past medical history of diabetes mellitus type 1 complicated by diabetic polyneuropathy and diabetic retinopathy, end-stage renal disease on hemodialysis Monday Wednesday and Fridays, hypertension and major depressive disorder presented to [facility] emergency department via EMS with complaints of bilateral lower extremity pain. Upon evaluation in the emergency department patient has been found to exhibit multiple SIRS criteria including fever of 103.1, tachycardia, tachypnea and leukocytosis.  Chest x-ray revealed no obvious evidence of pneumonia.  Patient was additionally found to be in diabetic ketoacidosis and therefore was initiated on an insulin infusion.  The hospitalist group has been called to assess the patient for admission for DKA and SIRS with possible sepsis.   Clinical Impression   PTA, pt lives with significant other, typically Independent in all daily tasks without need for AD. Pt presents now with deficits in dynamic standing balance, endurance and overall strength. Overall, pt able to mobilize to/from bathroom with intermittent support of IV pole and complete ADLs with no more than Supervision. Began education on strategies to minimize back pain during ADLs and ways to conserve energy at home. Will follow acutely though likely only one  more session needed to provide UE HEP and formal energy conservation education. Anticipate no OT needs at DC as pt reports adequate support for ADLs/IADLs at home.      Recommendations for follow up therapy are one component of a multi-disciplinary discharge planning process, led by the attending physician.  Recommendations may be updated based on patient status, additional functional criteria and insurance authorization.   Follow Up Recommendations  No OT follow  up    Assistance Recommended at Discharge PRN  Patient can return home with the following Assistance with cooking/housework;Assist for transportation;Help with stairs or ramp for entrance    Functional Status Assessment  Patient has had a recent decline in their functional status and demonstrates the ability to make significant improvements in function in a reasonable and predictable amount of time.  Equipment Recommendations  None recommended by OT    Recommendations for Other Services       Precautions / Restrictions Precautions Precautions: Fall Restrictions Weight Bearing Restrictions: No      Mobility Bed Mobility Overal bed mobility: Needs Assistance Bed Mobility: Supine to Sit, Sit to Supine     Supine to sit: Supervision, HOB elevated Sit to supine: Supervision   General bed mobility comments: increased time though no assist needed    Transfers Overall transfer level: Needs assistance Equipment used: None Transfers: Sit to/from Stand Sit to Stand: Supervision           General transfer comment: no use of AD      Balance Overall balance assessment: Mild deficits observed, not formally tested                                         ADL either performed or assessed with clinical judgement   ADL Overall ADL's : Needs assistance/impaired Eating/Feeding: Independent   Grooming: Supervision/safety;Standing;Wash/dry hands Grooming Details (indicate cue type and reason): no AD, no overt LOB or safety concerns Upper Body Bathing: Sitting;Modified independent   Lower Body Bathing: Supervison/ safety;Sit to/from stand   Upper Body Dressing : Modified independent   Lower Body Dressing: Supervision/safety;Sit to/from stand;Sitting/lateral leans  Toilet Transfer: Supervision/safety;Ambulation Toilet Transfer Details (indicate cue type and reason): use of IV pole intermittently to walk to/from bathroom Toileting- Clothing Manipulation and  Hygiene: Supervision/safety;Sitting/lateral lean;Sit to/from stand [place] Details (indicate cue type and reason): able to perform peri care, clothing mgmt without issue. Hat placed in toilet to catch urine (NT aware)       General ADL Comments: Pt with flat affect, appears to have endurance and strength deficits but reports she feels she can stand in shower for duration of task if she was at home. Began education on strategies to minimize back pain for LB ADLs, consideration of shower chair if endurance not at baseline at [place] Vision/History: 1 Wears glasses Ability to See in Adequate Light: 0 Adequate Patient Visual Report: No change from baseline Vision Assessment?: No apparent visual deficits     Perception     Praxis      Pertinent Vitals/Pain Pain Assessment Pain Assessment: Faces Faces Pain Scale: Hurts little more Pain Location: B LE, back Pain Descriptors / Indicators: Grimacing, Guarding Pain Intervention(s): Monitored during session, Repositioned, Heat applied     Hand Dominance Right   Extremity/Trunk Assessment Upper Extremity Assessment Upper Extremity Assessment: Generalized weakness   Lower Extremity Assessment Lower Extremity Assessment: Defer to PT evaluation   Cervical / Trunk Assessment Cervical / Trunk Assessment: Normal   Communication Communication Communication: No difficulties   Cognition Arousal/Alertness: Awake/alert Behavior During Therapy: Flat affect Overall Cognitive Status: No family/caregiver present to determine baseline cognitive functioning                                 General Comments: did not formally assess. flat affect throughout but follows all directions, may be distracted by reported pain     General Comments  HR low 100s    Exercises     Shoulder Instructions      Home Living Family/patient expects to be discharged to:: Private residence Living  Arrangements: Spouse/significant other Available Help at Discharge: Family;Available PRN/intermittently Type of Home: Other(Comment) (townhouse)       Home Layout: Two level Alternate Level Stairs-Number of Steps: flight             Home Equipment: None          Prior Functioning/Environment Prior Level of Function : Independent/Modified Independent             Mobility Comments: no use of AD ADLs Comments: Independent with ADLs/IADLs        OT Problem List: Decreased strength;Decreased activity tolerance;Impaired balance (sitting and/or standing);Pain      OT Treatment/Interventions: Self-care/ADL training;Therapeutic exercise;Energy conservation;DME and/or AE instruction;Therapeutic activities;Patient/family education    OT Goals(Current goals can be found in the care plan section) Acute Rehab OT Goals Patient Stated Goal: pain control OT Goal Formulation: With patient Time For Goal Achievement: 07/21/21 Potential to Achieve Goals: Good  OT Frequency: Min 2X/week    Co-evaluation              AM-PAC OT "6 Clicks" Daily Activity     Outcome Measure Help from another person eating meals?: None Help from another person taking care of personal grooming?: A Little Help from another person toileting, which includes using toliet, bedpan, or urinal?: A Little Help from another person bathing (including washing, rinsing, drying)?: A Little Help from another person to put on and taking off regular  upper body clothing?: None Help from another person to put on and taking off regular lower body clothing?: A Little 6 Click Score: 20   End of Session Nurse Communication: Mobility status (discussed with NT)  Activity Tolerance: Patient tolerated treatment well Patient left: in bed;with call bell/phone within reach  OT Visit Diagnosis: Unsteadiness on feet (R26.81);Muscle weakness (generalized) (M62.81)                Time: 1991-4445 OT Time Calculation (min):  14 min Charges:  OT General Charges $OT Visit: 1 Visit OT Evaluation $OT Eval Low Complexity: 1 Low  Malachy Chamber, OTR/L Acute Rehab Services Office: [phone]   Layla Maw 07/07/2021, 9:31 AM

## 2021-07-07 NOTE — Progress Notes (Signed)
BUE vein mapping has been completed.   Results can be found under chart review under CV PROC. 07/07/2021 3:17 PM Calene Paradiso RVT, RDMS

## 2021-07-07 NOTE — Progress Notes (Signed)
   [place] has been requested to perform a transesophageal echocardiogram on Barbara Donovan for bacteremia.  After careful review of history and examination, the risks and benefits of transesophageal echocardiogram have been explained including risks of esophageal damage, perforation (1:10,000 risk), bleeding, pharyngeal hematoma as well as other potential complications associated with conscious sedation including aspiration, arrhythmia, respiratory failure and death. Alternatives to treatment were discussed, questions were answered. Patient is willing to proceed.   Procedure is scheduled for 07/08/2021 at 12:00pm. Will make patient NPO at midnight and place pre-procedural orders.  Of note, hemoglobin has been around 7.5 the last several days. Platelets normal. She already has a daily CBC ordered so we can ensure this is stable tomorrow.  Darreld Mclean, PA-C 07/07/2021 4:13 PM

## 2021-07-07 NOTE — Progress Notes (Signed)
  Progress Note    07/07/2021 7:52 AM 2 Days Post-Op  Subjective:  no complaints   Vitals:   07/06/21 2008 07/07/21 0032  BP: (!) 162/91 (!) 151/90  Pulse:    Resp: 15 15  Temp: 98.5 F (36.9 C)   SpO2: 97% 95%   Physical Exam: Lungs:  non labored Incisions: abd incisions c/d/i Abdomen:  non tender Neurologic: A&O  CBC    Component Value Date/Time   WBC 15.0 (H) 07/07/2021 0545   RBC 2.65 (L) 07/07/2021 0545   HGB 7.5 (L) 07/07/2021 0545   HGB 9.7 (L) 12/22/2017 1459   HGB 13.0 07/07/2017 0000   HCT 21.8 (L) 07/07/2021 0545   HCT 28.5 (L) 12/22/2017 1459   HCT 39 07/07/2017 0000   PLT 409 (H) 07/07/2021 0545   PLT 427 12/22/2017 1459   PLT 374 07/07/2017 0000   MCV 82.3 07/07/2021 0545   MCV 82 12/22/2017 1459   MCH 28.3 07/07/2021 0545   MCHC 34.4 07/07/2021 0545   RDW 16.9 (H) 07/07/2021 0545   RDW 13.0 12/22/2017 1459   LYMPHSABS 0.5 (L) 07/03/2021 0424   MONOABS 1.5 (H) 07/03/2021 0424   EOSABS 0.0 07/03/2021 0424   BASOSABS 0.0 07/03/2021 0424    BMET    Component Value Date/Time   NA 129 (L) 07/07/2021 0545   NA 139 04/05/2019 1435   K 3.9 07/07/2021 0545   CL 89 (L) 07/07/2021 0545   CO2 21 (L) 07/07/2021 0545   GLUCOSE 87 07/07/2021 0545   BUN 68 (H) 07/07/2021 0545   BUN 31 (H) 04/05/2019 1435   CREATININE 9.85 (H) 07/07/2021 0545   CALCIUM 9.5 07/07/2021 0545   GFRNONAA 5 (L) 07/07/2021 0545   GFRAA 30 (L) 09/15/2019 0358    INR    Component Value Date/Time   INR 1.1 07/03/2021 0753     Intake/Output Summary (Last 24 hours) at 07/07/2021 0752 Last data filed at 07/06/2021 2210 Gross per 24 hour  Intake 243 ml  Output --  Net 243 ml     Assessment/Plan:  31 y.o. 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 I agree with the above.  Will plan to create access on Thursday assuming blood cultures remain negative.   Yevonne Aline. Stanford Breed, MD [facility] Phone Number: [phone] 07/07/2021 4:12 PM

## 2021-07-07 NOTE — Progress Notes (Addendum)
[facility] Progress Note   Subjective: Afebrile overnight. Hypertensive this AM. Will resume home medications. No C/Os. Says she is making urine. For TEE and [facility] placement tomorrow. Vein mapping today.     Objective Vitals:   07/06/21 1632 07/06/21 2008 07/07/21 0032 07/07/21 0755  BP: (!) 169/102 (!) 162/91 (!) 151/90 (!) 166/95  Pulse: 93   88  Resp: 13 15 15 15   Temp:  98.5 F (36.9 C)  98.3 F (36.8 C)  TempSrc: Oral Oral  Oral  SpO2: 98% 97% 95% 97%  Weight:      Height:       Physical Exam General: Chronically ill appearing younger female in NAD Heart: S1,S2 RRR No M/R/G Lungs: CTAB, no WOB Abdomen: NT, NABS Extremities:No LE edema Dialysis Access: No HD access    Additional Objective Labs: Basic Metabolic Panel: Recent Labs  Lab 07/03/21 0305 07/03/21 0424 07/05/21 1815 07/06/21 0543 07/07/21 0545  NA 124*   < > 127* 126* 129*  K 3.4*   < > 3.6 3.7 3.9  CL 90*   < > 86* 89* 89*  CO2 20*   < > 21* 22 21*  GLUCOSE 239*   < > 157* 141* 87  BUN 59*   < > 49* 55* 68*  CREATININE 8.79*   < > 7.72* 8.44* 9.85*  CALCIUM 8.9   < > 9.1 9.3 9.5  PHOS 7.3*  --   --   --   --    < > = values in this interval not displayed.   Liver Function Tests: Recent Labs  Lab 07/02/21 2232 07/03/21 0424  AST 51* 16  ALT 20 16  ALKPHOS 70 57  BILITOT 1.3* 0.5  PROT 8.2* 6.8  ALBUMIN 3.5 2.9*   Recent Labs  Lab 07/02/21 2232  LIPASE 26   CBC: Recent Labs  Lab 07/03/21 0024 07/03/21 0424 07/04/21 0606 07/05/21 0555 07/06/21 0543 07/07/21 0545  WBC 14.6* 13.2* 12.3* 13.0* 13.4* 15.0*  NEUTROABS 12.3* 11.0*  --   --   --   --   HGB 8.4* 9.0* 7.4* 7.7* 7.5* 7.5*  HCT 24.9* 27.0* 22.2* 22.6* 21.9* 21.8*  MCV 83.6 83.6 83.1 83.1 83.0 82.3  PLT 145* 153 167 236 308 409*   Blood Culture    Component Value Date/Time   SDES HEMODIALYSIS CATHETER 07/05/2021 0809   SPECREQUEST ANTIBIOTIC TREATMENT ANCEF 07/05/2021 0809   CULT RARE  STAPHYLOCOCCUS AUREUS 07/05/2021 0809   REPTSTATUS PENDING 07/05/2021 0809    Cardiac Enzymes: Recent Labs  Lab 07/03/21 0024  CKTOTAL 86   CBG: Recent Labs  Lab 07/06/21 1207 07/06/21 1616 07/06/21 2201 07/07/21 0414 07/07/21 0752  GLUCAP 134* 131* 132* 99 90   Iron Studies:  Recent Labs    07/05/21 0555  IRON 21*  TIBC 147*  FERRITIN 390*   @lablastinr3 @ Studies/Results: ECHOCARDIOGRAM COMPLETE  Result Date: 07/05/2021    ECHOCARDIOGRAM REPORT   Patient Name:   Barbara Donovan Date of Exam: 07/05/2021 Medical Rec #:  [id number]        Height:       66.0 in Accession #:    [id number]       Weight:       146.4 lb Date of Birth:  May 22, 1990        BSA:          1.751 m Patient Age:    30 years         BP:  117/68 mmHg Patient Gender: F                HR:           80 bpm. Exam Location:  Inpatient Procedure: 2D Echo, Cardiac Doppler and Color Doppler Indications:    Bacteremia  History:        Patient has prior history of Echocardiogram examinations, most                 recent 12/23/2017. Risk Factors:Diabetes.  Sonographer:    Joette Catching RCS Referring Phys: [id number] [place]  1. Left ventricular ejection fraction, by estimation, is 55 to 60%. The left ventricle has normal function. The left ventricle has no regional wall motion abnormalities. There is mild concentric left ventricular hypertrophy. Left ventricular diastolic parameters were normal.  2. Right ventricular systolic function is normal. The right ventricular size is normal. There is normal pulmonary artery systolic pressure.  3. Left atrial size was mildly dilated.  4. The mitral valve is normal in structure. Trivial mitral valve regurgitation. No evidence of mitral stenosis.  5. The aortic valve is tricuspid. Aortic valve regurgitation is not visualized. No aortic stenosis is present.  6. The inferior vena cava is normal in size with greater than 50% respiratory variability, suggesting right atrial  pressure of 3 mmHg. FINDINGS  Left Ventricle: Left ventricular ejection fraction, by estimation, is 55 to 60%. The left ventricle has normal function. The left ventricle has no regional wall motion abnormalities. The left ventricular internal cavity size was normal in size. There is  mild concentric left ventricular hypertrophy. Left ventricular diastolic parameters were normal. Indeterminate filling pressures. Right Ventricle: The right ventricular size is normal. No increase in right ventricular wall thickness. Right ventricular systolic function is normal. There is normal pulmonary artery systolic pressure. The tricuspid regurgitant velocity is 1.57 m/s, and  with an assumed right atrial pressure of 3 mmHg, the estimated right ventricular systolic pressure is 93.7 mmHg. Left Atrium: Left atrial size was mildly dilated. Right Atrium: Right atrial size was normal in size. Pericardium: Trivial pericardial effusion is present. Mitral Valve: The mitral valve is normal in structure. Trivial mitral valve regurgitation. No evidence of mitral valve stenosis. Tricuspid Valve: The tricuspid valve is normal in structure. Tricuspid valve regurgitation is trivial. No evidence of tricuspid stenosis. Aortic Valve: The aortic valve is tricuspid. Aortic valve regurgitation is not visualized. No aortic stenosis is present. Pulmonic Valve: The pulmonic valve was normal in structure. Pulmonic valve regurgitation is trivial. No evidence of pulmonic stenosis. Aorta: The aortic root is normal in size and structure. Venous: The inferior vena cava is normal in size with greater than 50% respiratory variability, suggesting right atrial pressure of 3 mmHg. IAS/Shunts: No atrial level shunt detected by color flow Doppler.  LEFT VENTRICLE PLAX 2D LVIDd:         4.70 cm   Diastology LVIDs:         3.20 cm   LV e' medial:    8.38 cm/s LV PW:         1.20 cm   LV E/e' medial:  10.6 LV IVS:        1.10 cm   LV e' lateral:   10.00 cm/s LVOT diam:      2.00 cm   LV E/e' lateral: 8.9 LVOT Area:     3.14 cm  RIGHT VENTRICLE             IVC RV  Basal diam:  2.50 cm     IVC diam: 1.40 cm RV Mid diam:    1.70 cm RV S prime:     12.70 cm/s TAPSE (M-mode): 2.0 cm LEFT ATRIUM             Index        RIGHT ATRIUM          Index LA diam:        3.00 cm 1.71 cm/m   RA Area:     7.56 cm LA Vol (A2C):   61.9 ml 35.34 ml/m  RA Volume:   11.90 ml 6.79 ml/m LA Vol (A4C):   42.8 ml 24.44 ml/m LA Biplane Vol: 55.3 ml 31.57 ml/m                        PULMONIC VALVE AORTA                 PV Vmax:          1.06 m/s Ao Root diam: 2.60 cm PV Peak grad:     4.5 mmHg Ao Asc diam:  2.70 cm PR End Diast Vel: 5.02 msec  MITRAL VALVE               TRICUSPID VALVE MV Area (PHT): 5.13 cm    TR Peak grad:   9.9 mmHg MV Decel Time: 148 msec    TR Vmax:        157.00 cm/s MV E velocity: 88.70 cm/s MV A velocity: 54.40 cm/s  SHUNTS MV E/A ratio:  1.63        Systemic Diam: 2.00 cm Skeet Latch MD Electronically signed by Skeet Latch MD Signature Date/Time: 07/05/2021/2:07:59 PM    Final    Medications:   ceFAZolin (ANCEF) IV Stopped (07/06/21 2101)    (feeding supplement) PROSource Plus  30 mL Oral BID BM   calcitRIOL  0.5 mcg Oral Daily   carvedilol  25 mg Oral BID   Chlorhexidine Gluconate Cloth  6 each Topical Q0600   FLUoxetine  40 mg Oral q morning   heparin  5,000 Units Subcutaneous Q8H   insulin aspart  0-6 Units Subcutaneous TID WC   insulin aspart  2 Units Subcutaneous TID WC   insulin glargine-yfgn  20 Units Subcutaneous Q24H   sevelamer carbonate  800 mg Oral TID WC   sodium chloride flush  3 mL Intravenous Q12H     [facility] on Tu/W/F schedule, had recently changed from PD 3:45hr, EDW 63kg, 350/600, 3K/2.5Ca, RIJ TDC,  -heparin 2000 units IV TIW - Mircera 74mcg IV q 2 weeks (last 6/5)   Assessment/Plan: MSSA bacteremia: Fever/arthraglias/myalgias on admit. Blood Cx 6/16 growing MSSA. ID consulted, on Cefazolin. PD cath  and [facility] pulled 6/18 for line holiday. Repeat Blood Cx 6/18 NGTD X 1 day. Thoracic/LS MRI without discitis. Echo 07/05/2021 without mention of vegetation on valves. Going for TEE 07/08/2021.  T1DM/DKA: Required insulin drip on admit, now resolved.  Hyponatremia-Na 129. On fluoxetine 40 mg PO. No evidence of overt volume overload. If Na does not improve after HD resumed, may ask primary team to adjust medications.  ESRD: Previously failed CCPD, changed back to HD 1-2 mo ago. Doing better since changing modalities, appettie, N/V improving. Dialyzed 07/04/2021. TDC to be replaced 07/08/2021. Will order HD 07/08/2021 when [facility] has been replaced. K+ 3.7 co2 22 today.  HTN/volume: BP high today. Resumed hydralazine 50 mg  PO BID and Amlodipine 10 mg PO q HS. Volume is OK. No recent weight-order daily weights. UF as tolerated in HD.  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 - will be due for ESA with next dialysis. Will d/c PO iron, can give with HD prn in future. Transfuse if HGB < 7.0 Secondary hyperparathyroidism: Corrected Calcium 10.4.  No recent Phos. . PO4 added to today's labs. On calcitriol daily, have changed to HD dosing with lower dose.  Nutrition: Alb low, adding supplements.  Rita H. Brown NP-C 07/07/2021, 11:00 AM  [employer] [phone]   Seen and examined independently.  Agree with note and exam as documented above by physician extender and as noted here.  General adult  in bed in no acute distress HEENT normocephalic atraumatic extraocular movements intact sclera anicteric Neck supple trachea midline Lungs clear to auscultation bilaterally normal work of breathing at rest  Heart S1S2 no rub Abdomen soft nontender nondistended Extremities no edema  Psych normal mood and affect Neuro - Alert and oriented x 3 provides hx and follows commands   MSSA bacteremia - line holiday currently. Tunneled catheter and PD catheter removed on 6/18 with [facility].  Plan for replacing tunn catheter on  6/21 per [facility].  She is NPO after midnight tonight.  Appreciate ID. On cefazolin.  Also getting TEE    ESRD - Plan for replacing tunn catheter on 6/21 per [facility]VS - this should be ok.  HD on 6/21 after line placement   Hyponatremia - noted.  improved without interim HD.  Off HD for line holiday. If continues after resuming HD would need to adjust SSRI.    Anemia CKD - ESA due next HD as above  Claudia Desanctis, MD 07/07/2021 12:27 PM

## 2021-07-07 NOTE — Progress Notes (Incomplete Revision)
  Progress Note    07/07/2021 7:52 AM 2 Days Post-Op  Subjective:  no complaints   Vitals:   07/06/21 2008 07/07/21 0032  BP: (!) 162/91 (!) 151/90  Pulse:    Resp: 15 15  Temp: 98.5 F (36.9 C)   SpO2: 97% 95%   Physical Exam: Lungs:  non labored Incisions: abd incisions c/d/i Abdomen:  non tender Neurologic: A&O  CBC    Component Value Date/Time   WBC 15.0 (H) 07/07/2021 0545   RBC 2.65 (L) 07/07/2021 0545   HGB 7.5 (L) 07/07/2021 0545   HGB 9.7 (L) 12/22/2017 1459   HGB 13.0 07/07/2017 0000   HCT 21.8 (L) 07/07/2021 0545   HCT 28.5 (L) 12/22/2017 1459   HCT 39 07/07/2017 0000   PLT 409 (H) 07/07/2021 0545   PLT 427 12/22/2017 1459   PLT 374 07/07/2017 0000   MCV 82.3 07/07/2021 0545   MCV 82 12/22/2017 1459   MCH 28.3 07/07/2021 0545   MCHC 34.4 07/07/2021 0545   RDW 16.9 (H) 07/07/2021 0545   RDW 13.0 12/22/2017 1459   LYMPHSABS 0.5 (L) 07/03/2021 0424   MONOABS 1.5 (H) 07/03/2021 0424   EOSABS 0.0 07/03/2021 0424   BASOSABS 0.0 07/03/2021 0424    BMET    Component Value Date/Time   NA 129 (L) 07/07/2021 0545   NA 139 04/05/2019 1435   K 3.9 07/07/2021 0545   CL 89 (L) 07/07/2021 0545   CO2 21 (L) 07/07/2021 0545   GLUCOSE 87 07/07/2021 0545   BUN 68 (H) 07/07/2021 0545   BUN 31 (H) 04/05/2019 1435   CREATININE 9.85 (H) 07/07/2021 0545   CALCIUM 9.5 07/07/2021 0545   GFRNONAA 5 (L) 07/07/2021 0545   GFRAA 30 (L) 09/15/2019 0358    INR    Component Value Date/Time   INR 1.1 07/03/2021 0753     Intake/Output Summary (Last 24 hours) at 07/07/2021 0752 Last data filed at 07/06/2021 2210 Gross per 24 hour  Intake 243 ml  Output --  Net 243 ml     Assessment/Plan:  31 y.o. female with bacteremia s/p TDC and PD catheter removal 2 Days Post-Op   TTE negative; plans noted for TEE tomorrow 6/21 Leukocytosis: WBC count up to 15 today but afebrile Vein mapping pending Plan for permanent access +TDC if blood cultures are negative; may need  AVG placement as an outpatient if she does not have a good vein conduit for fistula    Dagoberto Ligas, PA-C [facility] [phone] 07/07/2021 7:52 AM  VASCULAR STAFF ADDENDUM: I have independently interviewed and examined the patient. I agree with the above.  Will plan to create access on Thursday assuming blood cultures remain negative.   Yevonne Aline. Stanford Breed, MD [facility] Phone Number: [phone] 07/07/2021 4:12 PM

## 2021-07-07 NOTE — Progress Notes (Signed)
[facility] for Infectious Disease   Reason for visit: follow up on bacteremia  Interval History: WBC 15, remains afebrile > 48 hours Day 5 total antibiotics  Physical Exam: Constitutional:  Vitals:   07/07/21 0032 07/07/21 0755  BP: (!) 151/90 (!) 166/95  Pulse:  88  Resp: 15 15  Temp:  98.3 F (36.8 C)  SpO2: 95% 97%  She is in NAD Respiratory: normal respiratory effort  Review of Systems: Constitutional:negative for fever or chills  Lab Results  Component Value Date   WBC 15.0 (H) 07/07/2021   HGB 7.5 (L) 07/07/2021   HCT 21.8 (L) 07/07/2021   MCV 82.3 07/07/2021   PLT 409 (H) 07/07/2021    Lab Results  Component Value Date   CREATININE 9.85 (H) 07/07/2021   BUN 68 (H) 07/07/2021   NA 129 (L) 07/07/2021   K 3.9 07/07/2021   CL 89 (L) 07/07/2021   CO2 21 (L) 07/07/2021    Lab Results  Component Value Date   ALT 16 07/03/2021   AST 16 07/03/2021   ALKPHOS 57 07/03/2021     Microbiology: Recent Results (from the past 240 hour(s))  Culture, blood (Routine X 2) w Reflex to ID Panel     Status: Abnormal   Collection Time: 07/03/21  3:48 AM   Specimen: BLOOD RIGHT WRIST  Result Value Ref Range Status   Specimen Description   Final    BLOOD RIGHT WRIST Performed at [facility], [place] [address] [place], [place] [address]    Special Requests   Final    BOTTLES DRAWN AEROBIC AND ANAEROBIC Blood Culture adequate volume Performed at [facility] [address] [place], [place] [address]    Culture  Setup Time   Final    GRAM POSITIVE COCCI IN CLUSTERS IN BOTH AEROBIC AND ANAEROBIC BOTTLES CRITICAL RESULT CALLED TO, READ BACK BY AND VERIFIED WITH: PHARMD GRACE BARR ON 07/03/21 @ 1750 BY DRT    Culture (A)  Final    STAPHYLOCOCCUS AUREUS SUSCEPTIBILITIES PERFORMED ON PREVIOUS CULTURE WITHIN THE LAST 5 DAYS. Performed at [facility], [place] [address] [place], [place] [address]    Report Status 07/05/2021  FINAL  Final  Culture, blood (Routine X 2) w Reflex to ID Panel     Status: Abnormal   Collection Time: 07/03/21  4:15 AM   Specimen: BLOOD  Result Value Ref Range Status   Specimen Description   Final    BLOOD RIGHT ANTECUBITAL Performed at [place] [address] [place], [place] [address]    Special Requests   Final    BOTTLES DRAWN AEROBIC AND ANAEROBIC Blood Culture adequate volume Performed at [facility] [address], [place], [place] [address]    Culture  Setup Time   Final    GRAM POSITIVE COCCI IN CLUSTERS IN BOTH AEROBIC AND ANAEROBIC BOTTLES CRITICAL RESULT CALLED TO, READ BACK BY AND VERIFIED WITH: PHARMD GRACE BARR ON 07/03/21 @ 1749 BY DRT Performed at [facility], [place] [address] [place],  [address]    Culture STAPHYLOCOCCUS AUREUS (A)  Final   Report Status 07/05/2021 FINAL  Final   Organism ID, Bacteria STAPHYLOCOCCUS AUREUS  Final      Susceptibility   Staphylococcus aureus - MIC*    CIPROFLOXACIN <=0.5 SENSITIVE Sensitive     ERYTHROMYCIN >=8 RESISTANT Resistant     GENTAMICIN <=0.5 SENSITIVE Sensitive  OXACILLIN <=0.25 SENSITIVE Sensitive     TETRACYCLINE >=16 RESISTANT Resistant     VANCOMYCIN 1 SENSITIVE Sensitive     TRIMETH/SULFA <=10 SENSITIVE Sensitive     CLINDAMYCIN <=0.25 SENSITIVE Sensitive     RIFAMPIN <=0.5 SENSITIVE Sensitive     Inducible Clindamycin NEGATIVE Sensitive     * STAPHYLOCOCCUS AUREUS  Blood Culture ID Panel (Reflexed)     Status: Abnormal   Collection Time: 07/03/21  4:15 AM  Result Value Ref Range Status   Enterococcus faecalis NOT DETECTED NOT DETECTED Final   Enterococcus Faecium NOT DETECTED NOT DETECTED Final   Listeria monocytogenes NOT DETECTED NOT DETECTED Final   Staphylococcus species DETECTED (A) NOT DETECTED Final    Comment: CRITICAL RESULT CALLED TO, READ BACK BY AND VERIFIED WITH: PHARMD GRACE BARR ON 07/03/21 @ 1749 BY DRT    Staphylococcus aureus  (BCID) DETECTED (A) NOT DETECTED Final    Comment: CRITICAL RESULT CALLED TO, READ BACK BY AND VERIFIED WITH: PHARMD GRACE BARR ON 07/03/21 @ 1749 BY DRT    Staphylococcus epidermidis NOT DETECTED NOT DETECTED Final   Staphylococcus lugdunensis NOT DETECTED NOT DETECTED Final   Streptococcus species NOT DETECTED NOT DETECTED Final   Streptococcus agalactiae NOT DETECTED NOT DETECTED Final   Streptococcus pneumoniae NOT DETECTED NOT DETECTED Final   Streptococcus pyogenes NOT DETECTED NOT DETECTED Final   A.calcoaceticus-baumannii NOT DETECTED NOT DETECTED Final   Bacteroides fragilis NOT DETECTED NOT DETECTED Final   Enterobacterales NOT DETECTED NOT DETECTED Final   Enterobacter cloacae complex NOT DETECTED NOT DETECTED Final   Escherichia coli NOT DETECTED NOT DETECTED Final   Klebsiella aerogenes NOT DETECTED NOT DETECTED Final   Klebsiella oxytoca NOT DETECTED NOT DETECTED Final   Klebsiella pneumoniae NOT DETECTED NOT DETECTED Final   Proteus species NOT DETECTED NOT DETECTED Final   Salmonella species NOT DETECTED NOT DETECTED Final   Serratia marcescens NOT DETECTED NOT DETECTED Final   Haemophilus influenzae NOT DETECTED NOT DETECTED Final   Neisseria meningitidis NOT DETECTED NOT DETECTED Final   Pseudomonas aeruginosa NOT DETECTED NOT DETECTED Final   Stenotrophomonas maltophilia NOT DETECTED NOT DETECTED Final   Candida albicans NOT DETECTED NOT DETECTED Final   Candida auris NOT DETECTED NOT DETECTED Final   Candida glabrata NOT DETECTED NOT DETECTED Final   Candida krusei NOT DETECTED NOT DETECTED Final   Candida parapsilosis NOT DETECTED NOT DETECTED Final   Candida tropicalis NOT DETECTED NOT DETECTED Final   Cryptococcus neoformans/gattii NOT DETECTED NOT DETECTED Final   Meth resistant mecA/C and MREJ NOT DETECTED NOT DETECTED Final    Comment: Performed at [facility], [address], [place], [place] [address]  SARS Coronavirus 2 by RT PCR (hospital order,  performed in [facility] hospital lab) *cepheid single result test* Anterior Nasal Swab     Status: None   Collection Time: 07/03/21  6:30 AM   Specimen: Anterior Nasal Swab  Result Value Ref Range Status   SARS Coronavirus 2 by RT PCR NEGATIVE NEGATIVE Final    Comment: (NOTE) SARS-CoV-2 target nucleic acids are NOT DETECTED.  The SARS-CoV-2 RNA is generally detectable in upper and lower respiratory specimens during the acute phase of infection. The lowest concentration of SARS-CoV-2 viral copies this assay can detect is 250 copies / mL. A negative result does not preclude SARS-CoV-2 infection and should not be used as the sole basis for treatment or other patient management decisions.  A negative result may occur with improper specimen  collection / handling, submission of specimen other than nasopharyngeal swab, presence of viral mutation(s) within the areas targeted by this assay, and inadequate number of viral copies (<250 copies / mL). A negative result must be combined with clinical observations, patient history, and epidemiological information.  Fact Sheet for Patients:   https://www.patel.info/  Fact Sheet for Healthcare Providers: https://hall.com/  This test is not yet approved or  cleared by the [place] FDA and has been authorized for detection and/or diagnosis of SARS-CoV-2 by FDA under an Emergency Use Authorization (EUA).  This EUA will remain in effect (meaning this test can be used) for the duration of the COVID-19 declaration under Section 564(b)(1) of the Act, 21 U.S.C. section 360bbb-3(b)(1), unless the authorization is terminated or revoked sooner.  Performed at [facility], [place] [address] [place], [place] [address]   Culture, [place] Urine     Status: None   Collection Time: 07/03/21 11:18 AM   Specimen: Urine, Random  Result Value Ref Range Status   Specimen Description   Final    URINE,  RANDOM Performed at [facility] [address] [place], [place] [address]    Special Requests   Final    NONE Performed at [facility], [place] [address] [place], [place] [address]    Culture   Final    NO GROWTH NO GROUP B STREP (S.AGALACTIAE) ISOLATED Performed at [facility], [place] [address], [place], [place] [address]    Report Status 07/04/2021 FINAL  Final  Culture, blood (Routine X 2) w Reflex to ID Panel     Status: None (Preliminary result)   Collection Time: 07/05/21  1:48 AM   Specimen: BLOOD RIGHT HAND  Result Value Ref Range Status   Specimen Description BLOOD RIGHT HAND  Final   Special Requests   Final    AEROBIC BOTTLE ONLY Blood Culture results may not be optimal due to an inadequate volume of blood received in culture bottles   Culture   Final    NO GROWTH 2 DAYS Performed at [facility], [place] [address], [place], [place] [address]    Report Status PENDING  Incomplete  Culture, blood (Routine X 2) w Reflex to ID Panel     Status: None (Preliminary result)   Collection Time: 07/05/21  1:59 AM   Specimen: BLOOD LEFT HAND  Result Value Ref Range Status   Specimen Description BLOOD LEFT HAND  Final   Special Requests   Final    AEROBIC BOTTLE ONLY Blood Culture results may not be optimal due to an inadequate volume of blood received in culture bottles   Culture   Final    NO GROWTH 2 DAYS Performed at [facility], [place] [address] [place], [place] [address]    Report Status PENDING  Incomplete  Aerobic/Anaerobic Culture w Gram Stain (surgical/deep wound)     Status: None (Preliminary result)   Collection Time: 07/05/21  8:09 AM   Specimen: PATH Other; Tissue  Result Value Ref Range Status   Specimen Description HEMODIALYSIS CATHETER  Final   Special Requests ANTIBIOTIC TREATMENT ANCEF  Final   Gram Stain   Final    RARE WBC PRESENT,BOTH PMN AND MONONUCLEAR RARE GRAM POSITIVE COCCI IN PAIRS Performed  at [facility], [place] [address] [place],  [address]    Culture RARE STAPHYLOCOCCUS AUREUS  Final   Report Status PENDING  Incomplete   Organism ID,  Bacteria STAPHYLOCOCCUS AUREUS  Final      Susceptibility   Staphylococcus aureus - MIC*    CIPROFLOXACIN <=0.5 SENSITIVE Sensitive     ERYTHROMYCIN >=8 RESISTANT Resistant     GENTAMICIN <=0.5 SENSITIVE Sensitive     OXACILLIN <=0.25 SENSITIVE Sensitive     TETRACYCLINE 8 INTERMEDIATE Intermediate     VANCOMYCIN <=0.5 SENSITIVE Sensitive     TRIMETH/SULFA <=10 SENSITIVE Sensitive     CLINDAMYCIN <=0.25 SENSITIVE Sensitive     RIFAMPIN <=0.5 SENSITIVE Sensitive     Inducible Clindamycin NEGATIVE Sensitive     * RARE STAPHYLOCOCCUS AUREUS    Impression/Plan:  1. MSSA bacteremia - catheter removed and getting a line holiday.   Repeat blood cultures ngtd.   TEE planned for tomorrow.   Continue with cefazolin per hemodialysis.   2.  ESRD - on intermittent hemodialysis.  Getting antibiotics as above.

## 2021-07-07 NOTE — Plan of Care (Signed)
Problem: Education: Goal: Ability to describe self-care measures that may prevent or decrease complications (Diabetes Survival Skills Education) will improve Outcome: Progressing Goal: Individualized Educational Video(s) Outcome: Progressing   Problem: Coping: Goal: Ability to adjust to condition or change in health will improve Outcome: Progressing   Problem: Fluid Volume: Goal: Ability to maintain a balanced intake and output will improve Outcome: Progressing   Problem: Health Behavior/Discharge Planning: Goal: Ability to identify and utilize available resources and services will improve Outcome: Progressing Goal: Ability to manage health-related needs will improve Outcome: Progressing   Problem: Metabolic: Goal: Ability to maintain appropriate glucose levels will improve Outcome: Progressing   Problem: Nutritional: Goal: Maintenance of adequate nutrition will improve Outcome: Progressing Goal: Progress toward achieving an optimal weight will improve Outcome: Progressing   Problem: Skin Integrity: Goal: Risk for impaired skin integrity will decrease Outcome: Progressing   Problem: Tissue Perfusion: Goal: Adequacy of tissue perfusion will improve Outcome: Progressing   Problem: Education: Goal: Knowledge of General Education information will improve Description: Including pain rating scale, medication(s)/side effects and non-pharmacologic comfort measures Outcome: Progressing   Problem: Health Behavior/Discharge Planning: Goal: Ability to manage health-related needs will improve Outcome: Progressing   Problem: Clinical Measurements: Goal: Ability to maintain clinical measurements within normal limits will improve Outcome: Progressing Goal: Will remain free from infection Outcome: Progressing Goal: Diagnostic test results will improve Outcome: Progressing Goal: Respiratory complications will improve Outcome: Progressing Goal: Cardiovascular complication will  be avoided Outcome: Progressing   Problem: Activity: Goal: Risk for activity intolerance will decrease Outcome: Progressing   Problem: Nutrition: Goal: Adequate nutrition will be maintained Outcome: Progressing   Problem: Coping: Goal: Level of anxiety will decrease Outcome: Progressing   Problem: Elimination: Goal: Will not experience complications related to bowel motility Outcome: Progressing Goal: Will not experience complications related to urinary retention Outcome: Progressing   Problem: Pain Managment: Goal: General experience of comfort will improve Outcome: Progressing   Problem: Safety: Goal: Ability to remain free from injury will improve Outcome: Progressing   Problem: Skin Integrity: Goal: Risk for impaired skin integrity will decrease Outcome: Progressing   Problem: Fluid Volume: Goal: Hemodynamic stability will improve Outcome: Progressing   Problem: Clinical Measurements: Goal: Diagnostic test results will improve Outcome: Progressing Goal: Signs and symptoms of infection will decrease Outcome: Progressing   Problem: Respiratory: Goal: Ability to maintain adequate ventilation will improve Outcome: Progressing   Problem: Education: Goal: Ability to describe self-care measures that may prevent or decrease complications (Diabetes Survival Skills Education) will improve Outcome: Progressing Goal: Individualized Educational Video(s) Outcome: Progressing   Problem: Coping: Goal: Ability to adjust to condition or change in health will improve Outcome: Progressing   Problem: Fluid Volume: Goal: Ability to maintain a balanced intake and output will improve Outcome: Progressing   Problem: Health Behavior/Discharge Planning: Goal: Ability to identify and utilize available resources and services will improve Outcome: Progressing Goal: Ability to manage health-related needs will improve Outcome: Progressing   Problem: Metabolic: Goal: Ability to  maintain appropriate glucose levels will improve Outcome: Progressing   Problem: Nutritional: Goal: Maintenance of adequate nutrition will improve Outcome: Progressing Goal: Progress toward achieving an optimal weight will improve Outcome: Progressing   Problem: Skin Integrity: Goal: Risk for impaired skin integrity will decrease Outcome: Progressing   Problem: Tissue Perfusion: Goal: Adequacy of tissue perfusion will improve Outcome: Progressing   Problem: Education: Goal: Knowledge of General Education information will improve Description: Including pain rating scale, medication(s)/side effects and non-pharmacologic comfort measures Outcome: Progressing  Problem: Health Behavior/Discharge Planning: Goal: Ability to manage health-related needs will improve Outcome: Progressing   Problem: Clinical Measurements: Goal: Ability to maintain clinical measurements within normal limits will improve Outcome: Progressing Goal: Will remain free from infection Outcome: Progressing Goal: Diagnostic test results will improve Outcome: Progressing Goal: Respiratory complications will improve Outcome: Progressing Goal: Cardiovascular complication will be avoided Outcome: Progressing   Problem: Activity: Goal: Risk for activity intolerance will decrease Outcome: Progressing   Problem: Nutrition: Goal: Adequate nutrition will be maintained Outcome: Progressing   Problem: Coping: Goal: Level of anxiety will decrease Outcome: Progressing   Problem: Elimination: Goal: Will not experience complications related to bowel motility Outcome: Progressing Goal: Will not experience complications related to urinary retention Outcome: Progressing   Problem: Pain Managment: Goal: General experience of comfort will improve Outcome: Progressing   Problem: Safety: Goal: Ability to remain free from injury will improve Outcome: Progressing   Problem: Skin Integrity: Goal: Risk for  impaired skin integrity will decrease Outcome: Progressing   Problem: Fluid Volume: Goal: Hemodynamic stability will improve Outcome: Progressing   Problem: Clinical Measurements: Goal: Diagnostic test results will improve Outcome: Progressing Goal: Signs and symptoms of infection will decrease Outcome: Progressing   Problem: Respiratory: Goal: Ability to maintain adequate ventilation will improve Outcome: Progressing

## 2021-07-07 NOTE — Plan of Care (Signed)
No acute events overnight.  Problem: Education: Goal: Ability to describe self-care measures that may prevent or decrease complications (Diabetes Survival Skills Education) will improve Outcome: Progressing Goal: Individualized Educational Video(s) Outcome: Progressing   Problem: Coping: Goal: Ability to adjust to condition or change in health will improve Outcome: Progressing   Problem: Fluid Volume: Goal: Ability to maintain a balanced intake and output will improve Outcome: Progressing   Problem: Health Behavior/Discharge Planning: Goal: Ability to identify and utilize available resources and services will improve Outcome: Progressing Goal: Ability to manage health-related needs will improve Outcome: Progressing   Problem: Metabolic: Goal: Ability to maintain appropriate glucose levels will improve Outcome: Progressing   Problem: Nutritional: Goal: Maintenance of adequate nutrition will improve Outcome: Progressing Goal: Progress toward achieving an optimal weight will improve Outcome: Progressing   Problem: Skin Integrity: Goal: Risk for impaired skin integrity will decrease Outcome: Progressing   Problem: Tissue Perfusion: Goal: Adequacy of tissue perfusion will improve Outcome: Progressing   Problem: Education: Goal: Knowledge of General Education information will improve Description: Including pain rating scale, medication(s)/side effects and non-pharmacologic comfort measures Outcome: Progressing   Problem: Health Behavior/Discharge Planning: Goal: Ability to manage health-related needs will improve Outcome: Progressing   Problem: Clinical Measurements: Goal: Ability to maintain clinical measurements within normal limits will improve Outcome: Progressing Goal: Will remain free from infection Outcome: Progressing Goal: Diagnostic test results will improve Outcome: Progressing Goal: Respiratory complications will improve Outcome: Progressing Goal:  Cardiovascular complication will be avoided Outcome: Progressing   Problem: Activity: Goal: Risk for activity intolerance will decrease Outcome: Progressing   Problem: Nutrition: Goal: Adequate nutrition will be maintained Outcome: Progressing   Problem: Coping: Goal: Level of anxiety will decrease Outcome: Progressing   Problem: Elimination: Goal: Will not experience complications related to bowel motility Outcome: Progressing Goal: Will not experience complications related to urinary retention Outcome: Progressing   Problem: Pain Managment: Goal: General experience of comfort will improve Outcome: Progressing   Problem: Safety: Goal: Ability to remain free from injury will improve Outcome: Progressing   Problem: Skin Integrity: Goal: Risk for impaired skin integrity will decrease Outcome: Progressing   Problem: Fluid Volume: Goal: Hemodynamic stability will improve Outcome: Progressing   Problem: Clinical Measurements: Goal: Diagnostic test results will improve Outcome: Progressing Goal: Signs and symptoms of infection will decrease Outcome: Progressing   Problem: Respiratory: Goal: Ability to maintain adequate ventilation will improve Outcome: Progressing   

## 2021-07-08 ENCOUNTER — Inpatient Hospital Stay (HOSPITAL_COMMUNITY): Payer: 59 | Admitting: Certified Registered Nurse Anesthetist

## 2021-07-08 ENCOUNTER — Encounter (HOSPITAL_COMMUNITY)
Admission: EM | Disposition: A | Discharge status: Home / Self Care | Payer: Self-pay | Source: Home / Self Care | Attending: Internal Medicine

## 2021-07-08 ENCOUNTER — Encounter (HOSPITAL_COMMUNITY): Payer: Self-pay | Admitting: Cardiology

## 2021-07-08 ENCOUNTER — Inpatient Hospital Stay (HOSPITAL_COMMUNITY): Payer: 59

## 2021-07-08 DIAGNOSIS — I120 Hypertensive chronic kidney disease with stage 5 chronic kidney disease or end stage renal disease: Secondary | ICD-10-CM

## 2021-07-08 DIAGNOSIS — E1022 Type 1 diabetes mellitus with diabetic chronic kidney disease: Secondary | ICD-10-CM

## 2021-07-08 DIAGNOSIS — Z992 Dependence on renal dialysis: Secondary | ICD-10-CM

## 2021-07-08 DIAGNOSIS — I10 Essential (primary) hypertension: Secondary | ICD-10-CM

## 2021-07-08 DIAGNOSIS — R7881 Bacteremia: Secondary | ICD-10-CM

## 2021-07-08 DIAGNOSIS — N186 End stage renal disease: Secondary | ICD-10-CM

## 2021-07-08 HISTORY — PX: TEE WITHOUT CARDIOVERSION: SHX5443

## 2021-07-08 LAB — CBC WITH DIFFERENTIAL/PLATELET
Abs Immature Granulocytes: 0.4 10*3/uL — ABNORMAL HIGH (ref 0.00–0.07)
Basophils Absolute: 0.1 10*3/uL (ref 0.0–0.1)
Basophils Relative: 1 %
Eosinophils Absolute: 0.4 10*3/uL (ref 0.0–0.5)
Eosinophils Relative: 3 %
HCT: 22.1 % — ABNORMAL LOW (ref 36.0–46.0)
Hemoglobin: 7.2 g/dL — ABNORMAL LOW (ref 12.0–15.0)
Lymphocytes Relative: 9 %
Lymphs Abs: 1.2 10*3/uL (ref 0.7–4.0)
MCH: 27.6 pg (ref 26.0–34.0)
MCHC: 32.6 g/dL (ref 30.0–36.0)
MCV: 84.7 fL (ref 80.0–100.0)
Monocytes Absolute: 0.4 10*3/uL (ref 0.1–1.0)
Monocytes Relative: 3 %
Myelocytes: 2 %
Neutro Abs: 11 10*3/uL — ABNORMAL HIGH (ref 1.7–7.7)
Neutrophils Relative %: 81 %
Platelets: 503 10*3/uL — ABNORMAL HIGH (ref 150–400)
Promyelocytes Relative: 1 %
RBC: 2.61 MIL/uL — ABNORMAL LOW (ref 3.87–5.11)
RDW: 16.9 % — ABNORMAL HIGH (ref 11.5–15.5)
WBC: 13.6 10*3/uL — ABNORMAL HIGH (ref 4.0–10.5)
nRBC: 0 % (ref 0.0–0.2)
nRBC: 0 /100 WBC

## 2021-07-08 LAB — ANA W/REFLEX IF POSITIVE: Anti Nuclear Antibody (ANA): NEGATIVE

## 2021-07-08 LAB — RENAL FUNCTION PANEL
Albumin: 2 g/dL — ABNORMAL LOW (ref 3.5–5.0)
Albumin: 2.1 g/dL — ABNORMAL LOW (ref 3.5–5.0)
Anion gap: 16 — ABNORMAL HIGH (ref 5–15)
Anion gap: 18 — ABNORMAL HIGH (ref 5–15)
BUN: 82 mg/dL — ABNORMAL HIGH (ref 6–20)
BUN: 82 mg/dL — ABNORMAL HIGH (ref 6–20)
CO2: 20 mmol/L — ABNORMAL LOW (ref 22–32)
CO2: 18 mmol/L — ABNORMAL LOW (ref 22–32)
Calcium: 9.7 mg/dL (ref 8.9–10.3)
Calcium: 9.6 mg/dL (ref 8.9–10.3)
Chloride: 94 mmol/L — ABNORMAL LOW (ref 98–111)
Chloride: 94 mmol/L — ABNORMAL LOW (ref 98–111)
Creatinine, Ser: 11.61 mg/dL — ABNORMAL HIGH (ref 0.44–1.00)
Creatinine, Ser: 11.67 mg/dL — ABNORMAL HIGH (ref 0.44–1.00)
GFR, Estimated: 4 mL/min — ABNORMAL LOW (ref >60)
GFR, Estimated: 4 mL/min — ABNORMAL LOW (ref >60)
Glucose, Bld: 130 mg/dL — ABNORMAL HIGH (ref 70–99)
Glucose, Bld: 131 mg/dL — ABNORMAL HIGH (ref 70–99)
Phosphorus: 7.6 mg/dL — ABNORMAL HIGH (ref 2.5–4.6)
Phosphorus: 7.6 mg/dL — ABNORMAL HIGH (ref 2.5–4.6)
Potassium: 3.9 mmol/L (ref 3.5–5.1)
Potassium: 4 mmol/L (ref 3.5–5.1)
Sodium: 130 mmol/L — ABNORMAL LOW (ref 135–145)
Sodium: 130 mmol/L — ABNORMAL LOW (ref 135–145)

## 2021-07-08 LAB — GLUCOSE, CAPILLARY
Glucose-Capillary: 103 mg/dL — ABNORMAL HIGH (ref 70–99)
Glucose-Capillary: 118 mg/dL — ABNORMAL HIGH (ref 70–99)
Glucose-Capillary: 124 mg/dL — ABNORMAL HIGH (ref 70–99)
Glucose-Capillary: 129 mg/dL — ABNORMAL HIGH (ref 70–99)
Glucose-Capillary: 130 mg/dL — ABNORMAL HIGH (ref 70–99)
Glucose-Capillary: 159 mg/dL — ABNORMAL HIGH (ref 70–99)

## 2021-07-08 SURGERY — ECHOCARDIOGRAM, TRANSESOPHAGEAL
Anesthesia: Monitor Anesthesia Care

## 2021-07-08 MED ORDER — LIDOCAINE 2% (20 MG/ML) 5 ML SYRINGE
INTRAMUSCULAR | Status: DC | PRN
  Administered 2021-07-08: 60 mg via INTRAVENOUS

## 2021-07-08 MED ORDER — ALTEPLASE 2 MG IJ SOLR: 2.0000 mg | Freq: Once | INTRAMUSCULAR | Status: DC | PRN

## 2021-07-08 MED ORDER — LIDOCAINE HCL (PF) 1 % IJ SOLN: 5.0000 mL | INTRAMUSCULAR | Status: DC | PRN

## 2021-07-08 MED ORDER — SODIUM CHLORIDE 0.9 % IV SOLN: INTRAVENOUS | Status: DC

## 2021-07-08 MED ORDER — LIDOCAINE-PRILOCAINE 2.5-2.5 % EX CREA: 1.0000 | TOPICAL_CREAM | CUTANEOUS | Status: DC | PRN

## 2021-07-08 MED ORDER — PROPOFOL 500 MG/50ML IV EMUL
INTRAVENOUS | Status: DC | PRN
  Administered 2021-07-08: 150 ug/kg/min via INTRAVENOUS

## 2021-07-08 MED ORDER — PROPOFOL 10 MG/ML IV BOLUS
INTRAVENOUS | Status: DC | PRN
  Administered 2021-07-08 (×5): 20 mg via INTRAVENOUS

## 2021-07-08 MED ORDER — HEPARIN SODIUM (PORCINE) 1000 UNIT/ML DIALYSIS
1000.0000 [IU] | INTRAMUSCULAR | Status: DC | PRN
  Administered 2021-07-09: 1000 [IU]

## 2021-07-08 MED ORDER — SEVELAMER CARBONATE 800 MG PO TABS
1600.0000 mg | ORAL_TABLET | Freq: Three times a day (TID) | ORAL | Status: DC
  Administered 2021-07-08 – 2021-07-10 (×4): 1600 mg via ORAL
  Filled 2021-07-08 (×5): qty 2

## 2021-07-08 MED ORDER — SODIUM CHLORIDE 0.9 % IV SOLN
INTRAVENOUS | Status: DC
  Filled 2021-07-08 (×2): qty 500

## 2021-07-08 MED ORDER — ANTICOAGULANT SODIUM CITRATE 4% (200MG/5ML) IV SOLN: 5.0000 mL | Status: DC | PRN

## 2021-07-08 MED ORDER — PENTAFLUOROPROP-TETRAFLUOROETH EX AERO: 1.0000 | INHALATION_SPRAY | CUTANEOUS | Status: DC | PRN

## 2021-07-08 MED ORDER — BUTAMBEN-TETRACAINE-BENZOCAINE 2-2-14 % EX AERO
INHALATION_SPRAY | CUTANEOUS | Status: DC | PRN
  Administered 2021-07-08: 2 via TOPICAL

## 2021-07-08 NOTE — Plan of Care (Signed)
  Problem: Education: Goal: Ability to describe self-care measures that may prevent or decrease complications (Diabetes Survival Skills Education) will improve Outcome: Progressing   Problem: Coping: Goal: Ability to adjust to condition or change in health will improve Outcome: Progressing   

## 2021-07-08 NOTE — CV Procedure (Signed)
     TRANSESOPHAGEAL ECHOCARDIOGRAM   NAME:  Barbara Donovan   MRN: [id number] DOB:  April 07, 1990   ADMIT DATE: 07/02/2021  INDICATIONS: Bacteremia  PROCEDURE:   Informed consent was obtained prior to the procedure. The risks, benefits and alternatives for the procedure were discussed and the patient comprehended these risks.  Risks include, but are not limited to, cough, sore throat, vomiting, nausea, somnolence, esophageal and stomach trauma or perforation, bleeding, low blood pressure, aspiration, pneumonia, infection, trauma to the teeth and death.    After a procedural time-out, the oropharynx was anesthetized and the patient was sedated by the anesthesia service. The transesophageal probe was inserted in the esophagus and stomach without difficulty and multiple views were obtained. Anesthesia was monitored by Dr Gloris Manchester and Juanda Chance, CRNA.    COMPLICATIONS:    There were no immediate complications.  FINDINGS:  No valvular vegetation seen.  Tubular, linear mass in the SVC extending into the right atrium that, while thrombus cannot be excluded, likely represents ghost catheter fibrin sleeve from recently removed HD line   [name]  [address] [place], [place] [address] [phone]   2:32 PM

## 2021-07-08 NOTE — Interval H&P Note (Signed)
History and Physical Interval Note:  07/08/2021 11:36 AM  Barbara Donovan  has presented today for surgery, with the diagnosis of BACTERIMIA.  The various methods of treatment have been discussed with the patient and family. After consideration of risks, benefits and other options for treatment, the patient has consented to  Procedure(s): TRANSESOPHAGEAL ECHOCARDIOGRAM (TEE) (N/A) as a surgical intervention.  The patient's history has been reviewed, patient examined, no change in status, stable for surgery.  I have reviewed the patient's chart and labs.  Questions were answered to the patient's satisfaction.     Donato Heinz

## 2021-07-08 NOTE — Progress Notes (Addendum)
[facility] Progress Note   Subjective: Seen in room. Just returned from TEE, results pending.  Repeat BC NGTD. Afebrile overnight. Planning for [facility] placement 07/09/2021, HD to follow line placement.   Objective Vitals:   07/08/21 1217 07/08/21 1224 07/08/21 1234 07/08/21 1251  BP: 127/75 126/71 (!) 142/78 (!) 141/89  Pulse: 83 84 85 81  Resp: 15 17 13 13   Temp: 97.7 F (36.5 C)     TempSrc: Temporal     SpO2: 100% 96% 95%   Weight:      Height:       Physical Exam General: Chronically ill appearing younger female in NAD Heart: S1,S2 RRR No M/R/G Lungs: CTAB, no WOB Abdomen: NT, NABS Extremities:No LE edema Dialysis Access: No HD access    Additional Objective Labs: Basic Metabolic Panel: Recent Labs  Lab 07/07/21 0545 07/08/21 0815 07/08/21 1101  NA 129* 130* 130*  K 3.9 3.9 4.0  CL 89* 94* 94*  CO2 21* 20* 18*  GLUCOSE 87 130* 131*  BUN 68* 82* 82*  CREATININE 9.85* 11.61* 11.67*  CALCIUM 9.5 9.7 9.6  PHOS 6.4* 7.6* 7.6*   Liver Function Tests: Recent Labs  Lab 07/02/21 2232 07/03/21 0424 07/07/21 0545 07/08/21 0815 07/08/21 1101  AST 51* 16  --   --   --   ALT 20 16  --   --   --   ALKPHOS 70 57  --   --   --   BILITOT 1.3* 0.5  --   --   --   PROT 8.2* 6.8  --   --   --   ALBUMIN 3.5 2.9* 2.0* 2.0* 2.1*   Recent Labs  Lab 07/02/21 2232  LIPASE 26   CBC: Recent Labs  Lab 07/03/21 0024 07/03/21 0424 07/04/21 0606 07/05/21 0555 07/06/21 0543 07/07/21 0545 07/08/21 0815  WBC 14.6* 13.2* 12.3* 13.0* 13.4* 15.0* 13.6*  NEUTROABS 12.3* 11.0*  --   --   --   --  11.0*  HGB 8.4* 9.0* 7.4* 7.7* 7.5* 7.5* 7.2*  HCT 24.9* 27.0* 22.2* 22.6* 21.9* 21.8* 22.1*  MCV 83.6 83.6 83.1 83.1 83.0 82.3 84.7  PLT 145* 153 167 236 308 409* 503*   Blood Culture    Component Value Date/Time   SDES HEMODIALYSIS CATHETER 07/05/2021 0809   SPECREQUEST ANTIBIOTIC TREATMENT ANCEF 07/05/2021 0809   CULT  07/05/2021 0809    RARE  STAPHYLOCOCCUS AUREUS NO ANAEROBES ISOLATED; CULTURE IN PROGRESS FOR 5 DAYS    REPTSTATUS PENDING 07/05/2021 0809    Cardiac Enzymes: Recent Labs  Lab 07/03/21 0024  CKTOTAL 86   CBG: Recent Labs  Lab 07/07/21 1609 07/07/21 1945 07/08/21 0010 07/08/21 0422 07/08/21 0728  GLUCAP 84 164* 129* 124* 130*   Iron Studies: No results for input(s): "IRON", "TIBC", "TRANSFERRIN", "FERRITIN" in the last 72 hours. @lablastinr3 @ Studies/Results: VAS [name] UPPER EXT VEIN MAPPING (PRE-OP AVF)  Result Date: 07/07/2021 UPPER EXTREMITY VEIN MAPPING Patient Name:  MALAN WERK  Date of Exam:   07/07/2021 Medical Rec #: [id number]         Accession #:    [id number] Date of Birth: October 23, 1990         Patient Gender: F Patient Age:   31 years Exam [facility]  [facility] Procedure:      VAS [name] UPPER EXT VEIN MAPPING (PRE-OP AVF) Referring Phys: Karoline Caldwell --------------------------------------------------------------------------------  Indications: Pre-access. Comparison Study: No previous exams Performing Technologist: Jody Hill RVT, RDMS  Examination Guidelines: A complete evaluation includes B-mode imaging, spectral Doppler, color Doppler, and power Doppler as needed of all accessible portions of each vessel. Bilateral testing is considered an integral part of a complete examination. Limited examinations for reoccurring indications may be performed as noted. +-----------------+-------------+----------+--------------+ Right Cephalic   Diameter (cm)Depth (cm)   Findings    +-----------------+-------------+----------+--------------+ Shoulder             0.14        0.66                  +-----------------+-------------+----------+--------------+ Prox upper arm       0.11        0.56                  +-----------------+-------------+----------+--------------+ Mid upper arm        0.23        0.47                  +-----------------+-------------+----------+--------------+ Dist upper  arm       0.15        0.58                  +-----------------+-------------+----------+--------------+ Antecubital fossa    0.18        0.41                  +-----------------+-------------+----------+--------------+ Prox forearm         0.13        0.44      thrombus    +-----------------+-------------+----------+--------------+ Mid forearm          0.06        0.30                  +-----------------+-------------+----------+--------------+ Dist forearm         0.06        0.25                  +-----------------+-------------+----------+--------------+ Wrist                                   not visualized +-----------------+-------------+----------+--------------+ +-----------------+-------------+----------+--------------+ Right Basilic    Diameter (cm)Depth (cm)   Findings    +-----------------+-------------+----------+--------------+ Prox upper arm       0.24                              +-----------------+-------------+----------+--------------+ Mid upper arm        0.18                              +-----------------+-------------+----------+--------------+ Dist upper arm       0.12                              +-----------------+-------------+----------+--------------+ Antecubital fossa    0.12                              +-----------------+-------------+----------+--------------+ Prox forearm         0.10                              +-----------------+-------------+----------+--------------+ Mid forearm  not visualized +-----------------+-------------+----------+--------------+ Distal forearm                          not visualized +-----------------+-------------+----------+--------------+ Wrist                                   not visualized +-----------------+-------------+----------+--------------+ +-----------------+-------------+----------+--------------+ Left Cephalic    Diameter (cm)Depth  (cm)   Findings    +-----------------+-------------+----------+--------------+ Shoulder             0.17        0.52                  +-----------------+-------------+----------+--------------+ Prox upper arm       0.15        0.58                  +-----------------+-------------+----------+--------------+ Mid upper arm        0.05        0.48     branching    +-----------------+-------------+----------+--------------+ Dist upper arm       0.08        0.39                  +-----------------+-------------+----------+--------------+ Antecubital fossa    0.16        0.42                  +-----------------+-------------+----------+--------------+ Prox forearm         0.09        0.47     branching    +-----------------+-------------+----------+--------------+ Mid forearm          0.04        0.31                  +-----------------+-------------+----------+--------------+ Dist forearm                            not visualized +-----------------+-------------+----------+--------------+ Wrist                                   not visualized +-----------------+-------------+----------+--------------+ +-----------------+-------------+----------+---------+ Left Basilic     Diameter (cm)Depth (cm)Findings  +-----------------+-------------+----------+---------+ Prox upper arm       0.34                         +-----------------+-------------+----------+---------+ Mid upper arm        0.20                         +-----------------+-------------+----------+---------+ Dist upper arm       0.22                         +-----------------+-------------+----------+---------+ Antecubital fossa    0.22               branching +-----------------+-------------+----------+---------+ Prox forearm         0.10                         +-----------------+-------------+----------+---------+ Mid forearm          0.16                          +-----------------+-------------+----------+---------+  Distal forearm       0.13                         +-----------------+-------------+----------+---------+ *See table(s) above for measurements and observations.  Diagnosing physician: Deitra Mayo MD Electronically signed by Deitra Mayo MD on 07/07/2021 at 4:47:56 PM.    Final    Medications:  anticoagulant sodium citrate      ceFAZolin (ANCEF) IV 1 g (07/07/21 2025)    (feeding supplement) PROSource Plus  30 mL Oral BID BM   amLODipine  10 mg Oral QHS   calcitRIOL  0.5 mcg Oral Once per day on Tue Wed Fri   carvedilol  25 mg Oral BID   Chlorhexidine Gluconate Cloth  6 each Topical Q0600   darbepoetin (ARANESP) injection - DIALYSIS  100 mcg Intravenous Q Wed-HD   FLUoxetine  40 mg Oral q morning   heparin  5,000 Units Subcutaneous Q8H   hydrALAZINE  50 mg Oral Q12H   insulin aspart  0-6 Units Subcutaneous TID WC   insulin aspart  2 Units Subcutaneous TID WC   insulin glargine-yfgn  10 Units Subcutaneous Q24H   sevelamer carbonate  800 mg Oral TID WC   sodium chloride flush  3 mL Intravenous Q12H     Dialysis Orders: [facility] -> runs on Tu/W/F schedule, had recently changed from PD 3:45hr, EDW 63kg, 350/600, 3K/2.5Ca, RIJ TDC,  -heparin 2000 units IV TIW - Mircera 102mcg IV q 2 weeks (last 6/5)   Assessment/Plan: MSSA bacteremia: Fever/arthraglias/myalgias on admit. Blood Cx 6/16 growing MSSA. ID consulted, on Cefazolin. PD cath and [facility] pulled 6/18 for line holiday. Repeat Blood Cx 6/18 NGTD X 1 day. Thoracic/LS MRI without discitis. Echo 07/05/2021 without mention of vegetation on valves. Went for TEE 07/08/2021, results pending.  T1DM/DKA: Required insulin drip on admit, now resolved.  Hyponatremia-Na 130. On fluoxetine 40 mg PO. No evidence of overt volume overload. Na has improved since admission without HD. Follow labs.  ESRD: Previously failed CCPD, changed back to HD 1-2 mo ago. Doing better since changing  modalities, appettie, N/V improving. Dialyzed 07/04/2021. TDC to be replaced 07/09/2021. Will order HD 07/08/2021 when [facility] has been replaced. SCr 11.7 BUN 82 CO2  18. Watch for uremia.  HTN/volume: BP better controlled today. Resumed hydralazine 50 mg PO BID and Amlodipine 10 mg PO q HS. Volume is OK. No recent weight-order daily weights. UF as tolerated in HD.  Anemia of ESRD: Hgb 7.2 today. - will be due for ESA with next dialysis. Will d/c PO iron, can give with HD prn in future. Transfuse if HGB < 7.0 but try to hold off as she is new HD patient and may be eligible for kidney transplant.  Secondary hyperparathyroidism: Corrected Calcium 10.4.  PO4 7.6. Increased renvela dose. On calcitriol daily, have changed to HD dosing with lower dose.  Nutrition: Alb low, adding supplements.  Rita H. Brown NP-C 07/08/2021, 12:55 PM  [facility] [phone]   Seen and examined independently.  Agree with note and exam as documented above by physician extender and as noted here.  Case discussed with vascular extender on 6/20.   General adult female in bed in no acute distress HEENT normocephalic atraumatic extraocular movements intact sclera anicteric Neck supple trachea midline Lungs clear to auscultation bilaterally normal work of breathing at rest  Heart regular rate and rhythm no rubs or gallops appreciated Abdomen soft nontender nondistended Extremities no edema  Psych normal mood and affect Neuro - alert and oriented x3 provides hx and follows commands  # MSSA bacteremia -  line holiday currently. Tunneled catheter and PD catheter removed on 6/18 with [facility].  Plan for replacing tunn catheter on 6/22 per [facility].  She is NPO after midnight tonight.  Appreciate ID. On cefazolin.  # ESRD - no emergent indication for HD  - HD tomorrow (6/22) after tunneled catheter placement with [facility] - would please defer graft/permanent access in the setting of bacteremia - would schedule outpatient  after completion of antibiotics.  I have reached out to vascular and paged Dr. Stanford Breed to discuss  # Hyponatremia  - improved thankfully  - follow after resuming HD   # Anemia of CKD  - ESA is due  - defer transfusion today   Claudia Desanctis, MD 07/08/2021  3:02 PM

## 2021-07-08 NOTE — Plan of Care (Signed)
  Problem: Education: Goal: Ability to describe self-care measures that may prevent or decrease complications (Diabetes Survival Skills Education) will improve Outcome: Progressing Goal: Individualized Educational Video(s) Outcome: Progressing   Problem: Coping: Goal: Ability to adjust to condition or change in health will improve Outcome: Progressing   Problem: Fluid Volume: Goal: Ability to maintain a balanced intake and output will improve Outcome: Progressing   Problem: Health Behavior/Discharge Planning: Goal: Ability to identify and utilize available resources and services will improve Outcome: Progressing Goal: Ability to manage health-related needs will improve Outcome: Progressing   Problem: Metabolic: Goal: Ability to maintain appropriate glucose levels will improve Outcome: Progressing   Problem: Nutritional: Goal: Maintenance of adequate nutrition will improve Outcome: Progressing Goal: Progress toward achieving an optimal weight will improve Outcome: Progressing   Problem: Skin Integrity: Goal: Risk for impaired skin integrity will decrease Outcome: Progressing   Problem: Tissue Perfusion: Goal: Adequacy of tissue perfusion will improve Outcome: Progressing   Problem: Education: Goal: Knowledge of General Education information will improve Description: Including pain rating scale, medication(s)/side effects and non-pharmacologic comfort measures Outcome: Progressing   Problem: Health Behavior/Discharge Planning: Goal: Ability to manage health-related needs will improve Outcome: Progressing   Problem: Clinical Measurements: Goal: Ability to maintain clinical measurements within normal limits will improve Outcome: Progressing Goal: Will remain free from infection Outcome: Progressing Goal: Diagnostic test results will improve Outcome: Progressing Goal: Respiratory complications will improve Outcome: Progressing Goal: Cardiovascular complication will  be avoided Outcome: Progressing   Problem: Activity: Goal: Risk for activity intolerance will decrease Outcome: Progressing   Problem: Nutrition: Goal: Adequate nutrition will be maintained Outcome: Progressing   Problem: Coping: Goal: Level of anxiety will decrease Outcome: Progressing   Problem: Elimination: Goal: Will not experience complications related to bowel motility Outcome: Progressing Goal: Will not experience complications related to urinary retention Outcome: Progressing   Problem: Pain Managment: Goal: General experience of comfort will improve Outcome: Progressing   Problem: Safety: Goal: Ability to remain free from injury will improve Outcome: Progressing   Problem: Skin Integrity: Goal: Risk for impaired skin integrity will decrease Outcome: Progressing   Problem: Fluid Volume: Goal: Hemodynamic stability will improve Outcome: Progressing   Problem: Clinical Measurements: Goal: Diagnostic test results will improve Outcome: Progressing Goal: Signs and symptoms of infection will decrease Outcome: Progressing   Problem: Respiratory: Goal: Ability to maintain adequate ventilation will improve Outcome: Progressing   

## 2021-07-08 NOTE — Progress Notes (Addendum)
Subjective: Patient seen and evaluated at bedside.  She reports resolution of her abdominal tenderness and leg pains.  She has been tolerating physical therapy well.  She denies any episodes of nausea or vomiting.  She denies any other complaints at this time.  Objective:  Vital signs in last 24 hours: Vitals:   07/08/21 1217 07/08/21 1224 07/08/21 1234 07/08/21 1251  BP: 127/75 126/71 (!) 142/78 (!) 141/89  Pulse: 83 84 85 81  Resp: 15 17 13 13   Temp: 97.7 F (36.5 C)     TempSrc: Temporal     SpO2: 100% 96% 95%   Weight:      Height:       Physical Exam Constitutional:      General: She is not in acute distress. Cardiovascular:     Rate and Rhythm: Normal rate.     Heart sounds: Normal heart sounds.  Pulmonary:     Effort: Pulmonary effort is normal.     Breath sounds: Normal breath sounds and air entry.  Abdominal:     Palpations: Abdomen is soft.     Tenderness: There is no abdominal tenderness.  Musculoskeletal:     Right lower leg: No edema.     Left lower leg: No edema.  Skin:    General: Skin is warm and dry.  Neurological:     General: No focal deficit present.     Mental Status: She is alert. Mental status is at baseline.  Psychiatric:        Behavior: Behavior normal. Behavior is cooperative.        Cognition and Memory: Cognition normal.      Assessment/Plan:  Principal Problem:   MSSA bacteremia Active Problems:   Essential hypertension   DKA, type 1 ([place])   ESRD on hemodialysis (HCC)  Barbara Donovan is a 31 y.o. with a pertinent PMH of type 1 diabetes, ESRD on HD, who presented with feeling unwell and admitted for sepsis secondary to MSSA bacteremia.   MSSA bacteremia Blood cultures continue to show no growth to date. CBC significant for improvement of leukocytosis.  Patient remains afebrile and normal HR.  She appears to be improving clinically. TEE obtained today shows no evidence of endocarditis. - Continue IV cefazolin  ESRD Last  hemodialysis session 07/04/2021.  Electrolytes are acceptable, however creatinine levels and BUN continue to rise.  Watch for uremia. Per vascular, HD line to be placed tomorrow (07/09/2021).  Once HD line is placed patient will have dialysis at that time (07/09/2021). Otherwise, on admission patient shows no signs of neurological decline.  -N.p.o. at midnight - Nephrology following, recommendations appreciated - Vascular following, recommendation appreciated - Pain control with Dilaudid  Hypertension --Amlodipine 10mg  daily --Hydralazine 50mg  BID - Coreg 25mg  BID  Type 1 diabetes mellitus -Semglee 20 units Q24H - SSI with meal coverage --NPO at midnight as stated above  Anemia of chronic disease Hemoglobin trending down slightly, currently at 7.2.  No active signs of bleeding.  Likely secondary to ESRD.  Hopeful with EPO with dialysis tomorrow, hemoglobin will improve.  Monitor hemoglobin levels closely.  Hope to avoid transfusion, however, if absolutely needed will transfuse.  Secondary hyperparathyroidism Calcium levels acceptable; phosphate levels elevated.  Nephrology following. -Continue phosphate binders -Continue calcitriol daily   Anxiety/depression - Continue Prozac   Gastroparesis Home medication includes Reglan.  She denies any episodes of vomiting since admission.  If she becomes symptomatic, can resume her home medication Reglan.  Prior to Admission Living  Arrangement: Anticipated Discharge Location: Barriers to Discharge: Dispo: Anticipated discharge in approximately 1-2 day(s).   Timothy Lasso, MD 07/08/2021, 3:17 PM Pager: [phone] After 5pm on weekdays and 1pm on weekends: On Call pager [phone]

## 2021-07-08 NOTE — Anesthesia Postprocedure Evaluation (Signed)
Anesthesia Post Note  Patient: Barbara Donovan  Procedure(s) Performed: TRANSESOPHAGEAL ECHOCARDIOGRAM (TEE)     Patient location during evaluation: Endoscopy Anesthesia Type: MAC Level of consciousness: awake and alert Pain management: pain level controlled Vital Signs Assessment: post-procedure vital signs reviewed and stable Respiratory status: spontaneous breathing, nonlabored ventilation, respiratory function stable and patient connected to nasal cannula oxygen Cardiovascular status: stable and blood pressure returned to baseline Postop Assessment: no apparent nausea or vomiting Anesthetic complications: no   No notable events documented.  Last Vitals:  Vitals:   07/08/21 1234 07/08/21 1251  BP: (!) 142/78 (!) 141/89  Pulse: 85 81  Resp: 13 13  Temp:    SpO2: 95%     Last Pain:  Vitals:   07/08/21 1234  TempSrc:   PainSc: 0-No pain                 Belenda Cruise P Deen Deguia

## 2021-07-08 NOTE — Anesthesia Preprocedure Evaluation (Signed)
Anesthesia Evaluation  Patient identified by MRN, date of birth, ID band Patient awake    Reviewed: Allergy & Precautions, NPO status , Patient's Chart, lab work & pertinent test results  Airway Mallampati: II  TM Distance: >3 FB Neck ROM: Full    Dental no notable dental hx.    Pulmonary asthma ,    Pulmonary exam normal        Cardiovascular hypertension, Pt. on medications  Rhythm:Regular Rate:Normal     Neuro/Psych Anxiety Depression negative neurological ROS     GI/Hepatic negative GI ROS, Neg liver ROS,   Endo/Other  diabetes, Type 1, Insulin Dependent  Renal/GU ESRFRenal disease  negative genitourinary   Musculoskeletal negative musculoskeletal ROS (+)   Abdominal Normal abdominal exam  (+)   Peds  Hematology  (+) Blood dyscrasia, anemia , Bacteremia    Anesthesia Other Findings   Reproductive/Obstetrics                             Anesthesia Physical Anesthesia Plan  ASA: 3  Anesthesia Plan: MAC   Post-op Pain Management:    Induction: Intravenous  PONV Risk Score and Plan: 2 and Propofol infusion and Treatment may vary due to age or medical condition  Airway Management Planned: Simple Face Mask, Natural Airway and Nasal Cannula  Additional Equipment: None  Intra-op Plan:   Post-operative Plan:   Informed Consent: I have reviewed the patients History and Physical, chart, labs and discussed the procedure including the risks, benefits and alternatives for the proposed anesthesia with the patient or authorized representative who has indicated his/her understanding and acceptance.     Dental advisory given  Plan Discussed with:   Anesthesia Plan Comments: (Lab Results      Component                Value               Date                      WBC                      13.6 (H)            07/08/2021                HGB                      7.2 (L)              07/08/2021                HCT                      22.1 (L)            07/08/2021                MCV                      84.7                07/08/2021                PLT                      503 (H)  07/08/2021           Lab Results      Component                Value               Date                      NA                       130 (L)             07/08/2021                K                        3.9                 07/08/2021                CO2                      20 (L)              07/08/2021                GLUCOSE                  130 (H)             07/08/2021                BUN                      82 (H)              07/08/2021                CREATININE               11.61 (H)           07/08/2021                CALCIUM                  9.7                 07/08/2021                GFRNONAA                 4 (L)               07/08/2021            ECHO 05/23: 1. Left ventricular ejection fraction, by estimation, is 55 to 60%. The  left ventricle has normal function. The left ventricle has no regional  wall motion abnormalities. There is mild concentric left ventricular  hypertrophy. Left ventricular diastolic  parameters were normal.  2. Right ventricular systolic function is normal. The right ventricular  size is normal. There is normal pulmonary artery systolic pressure.  3. Left atrial size was mildly dilated.  4. The mitral valve is normal in structure. Trivial mitral valve  regurgitation. No evidence of mitral stenosis.  5. The aortic valve is tricuspid. Aortic valve regurgitation is not  visualized. No aortic stenosis is present.  6. The inferior vena cava is normal in size with greater than  50%  respiratory variability, suggesting right atrial pressure of 3 mmHg. )        Anesthesia Quick Evaluation

## 2021-07-08 NOTE — Transfer of Care (Signed)
Immediate Anesthesia Transfer of Care Note  Patient: Barbara Donovan  Procedure(s) Performed: TRANSESOPHAGEAL ECHOCARDIOGRAM (TEE)  Patient Location: Endoscopy Unit  Anesthesia Type:MAC  Level of Consciousness: awake, alert  and oriented  Airway & Oxygen Therapy: Patient Spontanous Breathing and Patient connected to nasal cannula oxygen  Post-op Assessment: Report given to RN and Post -op Vital signs reviewed and stable  Post vital signs: Reviewed and stable  Last Vitals:  Vitals Value Taken Time  BP 114/75   Temp    Pulse 83 07/08/21 1216  Resp 21 07/08/21 1216  SpO2 100 % 07/08/21 1216  Vitals shown include unvalidated device data.  Last Pain:  Vitals:   07/08/21 1121  TempSrc: Temporal  PainSc: 0-No pain      Patients Stated Pain Goal: 0 (03/49/61 1643)  Complications: No notable events documented.

## 2021-07-08 NOTE — Progress Notes (Signed)
  Echocardiogram Echocardiogram Transesophageal has been performed.  Fidel Levy 07/08/2021, 12:22 PM

## 2021-07-08 NOTE — Progress Notes (Signed)
[facility] [place] PROGRESS NOTE  ASSESSMENT / PLAN: Barbara Donovan is a 31 y.o. female with ESRD; bacteremia. Line holiday x 3 days. Plan TDC +/- LUE AVG tomorrow in OR. NPO after midnight. Orders for consent written.  SUBJECTIVE: No complaints.  OBJECTIVE: BP (!) 141/89   Pulse 81   Temp 97.7 F (36.5 C) (Temporal)   Resp 13   Ht 5' 5.98" (1.676 m)   Wt 66.4 kg   SpO2 95%   BMI 23.64 kg/m   Intake/Output Summary (Last 24 hours) at 07/08/2021 1448 Last data filed at 07/08/2021 1211 Gross per 24 hour  Intake 300 ml  Output --  Net 300 ml    No distress Abdomen soft TDC site healing appropriately     Latest Ref Rng & Units 07/08/2021    8:15 AM 07/07/2021    5:45 AM 07/06/2021    5:43 AM  CBC  WBC 4.0 - 10.5 K/uL 13.6  15.0  13.4   Hemoglobin 12.0 - 15.0 g/dL 7.2  7.5  7.5   Hematocrit 36.0 - 46.0 % 22.1  21.8  21.9   Platelets 150 - 400 K/uL 503  409  308         Latest Ref Rng & Units 07/08/2021   11:01 AM 07/08/2021    8:15 AM 07/07/2021    5:45 AM  CMP  Glucose 70 - 99 mg/dL 131  130  87   BUN 6 - 20 mg/dL 82  82  68   Creatinine 0.44 - 1.00 mg/dL 11.67  11.61  9.85   Sodium 135 - 145 mmol/L 130  130  129   Potassium 3.5 - 5.1 mmol/L 4.0  3.9  3.9   Chloride 98 - 111 mmol/L 94  94  89   CO2 22 - 32 mmol/L 18  20  21    Calcium 8.9 - 10.3 mg/dL 9.6  9.7  9.5     Estimated Creatinine Clearance: 6.6 mL/min (A) (by C-G formula based on SCr of 11.67 mg/dL (H)).  Yevonne Aline. Stanford Breed, MD [facility] Phone Number: [phone] 07/08/2021 2:48 PM

## 2021-07-09 ENCOUNTER — Encounter (HOSPITAL_COMMUNITY)
Admission: EM | Disposition: A | Discharge status: Home / Self Care | Payer: Self-pay | Source: Home / Self Care | Attending: Internal Medicine

## 2021-07-09 ENCOUNTER — Inpatient Hospital Stay (HOSPITAL_COMMUNITY): Payer: 59 | Admitting: Anesthesiology

## 2021-07-09 ENCOUNTER — Inpatient Hospital Stay (HOSPITAL_COMMUNITY): Payer: 59

## 2021-07-09 ENCOUNTER — Encounter (HOSPITAL_COMMUNITY): Payer: Self-pay | Admitting: Internal Medicine

## 2021-07-09 ENCOUNTER — Inpatient Hospital Stay (HOSPITAL_COMMUNITY): Payer: 59 | Admitting: Certified Registered"

## 2021-07-09 DIAGNOSIS — N186 End stage renal disease: Secondary | ICD-10-CM

## 2021-07-09 DIAGNOSIS — B9561 Methicillin susceptible Staphylococcus aureus infection as the cause of diseases classified elsewhere: Secondary | ICD-10-CM

## 2021-07-09 DIAGNOSIS — E1022 Type 1 diabetes mellitus with diabetic chronic kidney disease: Secondary | ICD-10-CM

## 2021-07-09 DIAGNOSIS — D631 Anemia in chronic kidney disease: Secondary | ICD-10-CM

## 2021-07-09 DIAGNOSIS — Z992 Dependence on renal dialysis: Secondary | ICD-10-CM

## 2021-07-09 DIAGNOSIS — T80212A Local infection due to central venous catheter, initial encounter: Secondary | ICD-10-CM

## 2021-07-09 DIAGNOSIS — I120 Hypertensive chronic kidney disease with stage 5 chronic kidney disease or end stage renal disease: Secondary | ICD-10-CM

## 2021-07-09 DIAGNOSIS — N185 Chronic kidney disease, stage 5: Secondary | ICD-10-CM

## 2021-07-09 DIAGNOSIS — R7881 Bacteremia: Secondary | ICD-10-CM

## 2021-07-09 HISTORY — PX: INSERTION OF DIALYSIS CATHETER: SHX1324

## 2021-07-09 LAB — CBC WITH DIFFERENTIAL/PLATELET
Abs Immature Granulocytes: 0.5 10*3/uL — ABNORMAL HIGH (ref 0.00–0.07)
Basophils Absolute: 0 10*3/uL (ref 0.0–0.1)
Basophils Relative: 0 %
Eosinophils Absolute: 0.4 10*3/uL (ref 0.0–0.5)
Eosinophils Relative: 3 %
HCT: 20.7 % — ABNORMAL LOW (ref 36.0–46.0)
Hemoglobin: 6.9 g/dL — CL (ref 12.0–15.0)
Lymphocytes Relative: 7 %
Lymphs Abs: 0.9 10*3/uL (ref 0.7–4.0)
MCH: 27.8 pg (ref 26.0–34.0)
MCHC: 33.3 g/dL (ref 30.0–36.0)
MCV: 83.5 fL (ref 80.0–100.0)
Metamyelocytes Relative: 1 %
Monocytes Absolute: 1 10*3/uL (ref 0.1–1.0)
Monocytes Relative: 8 %
Myelocytes: 3 %
Neutro Abs: 9.9 10*3/uL — ABNORMAL HIGH (ref 1.7–7.7)
Neutrophils Relative %: 78 %
Platelets: 549 10*3/uL — ABNORMAL HIGH (ref 150–400)
RBC: 2.48 MIL/uL — ABNORMAL LOW (ref 3.87–5.11)
RDW: 16.9 % — ABNORMAL HIGH (ref 11.5–15.5)
WBC: 12.7 10*3/uL — ABNORMAL HIGH (ref 4.0–10.5)
nRBC: 0 % (ref 0.0–0.2)
nRBC: 0 /100 WBC

## 2021-07-09 LAB — GLUCOSE, CAPILLARY
Glucose-Capillary: 144 mg/dL — ABNORMAL HIGH (ref 70–99)
Glucose-Capillary: 148 mg/dL — ABNORMAL HIGH (ref 70–99)
Glucose-Capillary: 170 mg/dL — ABNORMAL HIGH (ref 70–99)
Glucose-Capillary: 174 mg/dL — ABNORMAL HIGH (ref 70–99)
Glucose-Capillary: 213 mg/dL — ABNORMAL HIGH (ref 70–99)
Glucose-Capillary: 214 mg/dL — ABNORMAL HIGH (ref 70–99)
Glucose-Capillary: 233 mg/dL — ABNORMAL HIGH (ref 70–99)
Glucose-Capillary: 264 mg/dL — ABNORMAL HIGH (ref 70–99)

## 2021-07-09 LAB — RENAL FUNCTION PANEL
Albumin: 1.9 g/dL — ABNORMAL LOW (ref 3.5–5.0)
Anion gap: 16 — ABNORMAL HIGH (ref 5–15)
BUN: 90 mg/dL — ABNORMAL HIGH (ref 6–20)
CO2: 21 mmol/L — ABNORMAL LOW (ref 22–32)
Calcium: 9.3 mg/dL (ref 8.9–10.3)
Chloride: 93 mmol/L — ABNORMAL LOW (ref 98–111)
Creatinine, Ser: 12.1 mg/dL — ABNORMAL HIGH (ref 0.44–1.00)
GFR, Estimated: 4 mL/min — ABNORMAL LOW (ref >60)
Glucose, Bld: 141 mg/dL — ABNORMAL HIGH (ref 70–99)
Phosphorus: 7.6 mg/dL — ABNORMAL HIGH (ref 2.5–4.6)
Potassium: 3.7 mmol/L (ref 3.5–5.1)
Sodium: 130 mmol/L — ABNORMAL LOW (ref 135–145)

## 2021-07-09 LAB — PREPARE RBC (CROSSMATCH)

## 2021-07-09 SURGERY — INSERTION OF DIALYSIS CATHETER
Anesthesia: General | Site: Neck | Laterality: Right

## 2021-07-09 SURGERY — INSERTION OF DIALYSIS CATHETER
Anesthesia: General | Site: Neck | Laterality: Left

## 2021-07-09 MED ORDER — AMISULPRIDE (ANTIEMETIC) 5 MG/2ML IV SOLN: 10.0000 mg | Freq: Once | INTRAVENOUS | Status: DC | PRN

## 2021-07-09 MED ORDER — CEFAZOLIN SODIUM-DEXTROSE 2-4 GM/100ML-% IV SOLN
INTRAVENOUS | Status: AC
  Filled 2021-07-09: qty 100

## 2021-07-09 MED ORDER — MIDAZOLAM HCL 2 MG/2ML IJ SOLN
INTRAMUSCULAR | Status: AC
  Filled 2021-07-09: qty 2

## 2021-07-09 MED ORDER — DEXAMETHASONE SODIUM PHOSPHATE 10 MG/ML IJ SOLN
INTRAMUSCULAR | Status: DC | PRN
  Administered 2021-07-09: 10 mg via INTRAVENOUS

## 2021-07-09 MED ORDER — SODIUM CHLORIDE 0.9 % IV SOLN: INTRAVENOUS | Status: DC

## 2021-07-09 MED ORDER — ONDANSETRON HCL 4 MG/2ML IJ SOLN
INTRAMUSCULAR | Status: AC
  Filled 2021-07-09: qty 2

## 2021-07-09 MED ORDER — CHLORHEXIDINE GLUCONATE 0.12 % MT SOLN
OROMUCOSAL | Status: AC
  Administered 2021-07-09: 15 mL via OROMUCOSAL
  Filled 2021-07-09: qty 15

## 2021-07-09 MED ORDER — DEXAMETHASONE SODIUM PHOSPHATE 10 MG/ML IJ SOLN
INTRAMUSCULAR | Status: AC
  Filled 2021-07-09: qty 1

## 2021-07-09 MED ORDER — FENTANYL CITRATE (PF) 250 MCG/5ML IJ SOLN
INTRAMUSCULAR | Status: DC | PRN
  Administered 2021-07-09 (×3): 50 ug via INTRAVENOUS

## 2021-07-09 MED ORDER — CEFAZOLIN SODIUM-DEXTROSE 2-4 GM/100ML-% IV SOLN
2.0000 g | Freq: Once | INTRAVENOUS | Status: AC
  Administered 2021-07-09: 2 g via INTRAVENOUS

## 2021-07-09 MED ORDER — PROPOFOL 10 MG/ML IV BOLUS
INTRAVENOUS | Status: DC | PRN
  Administered 2021-07-09: 200 mg via INTRAVENOUS

## 2021-07-09 MED ORDER — INSULIN ASPART 100 UNIT/ML IJ SOLN: 0.0000 [IU] | INTRAMUSCULAR | Status: DC | PRN

## 2021-07-09 MED ORDER — OXYCODONE HCL 5 MG PO TABS: 5.0000 mg | ORAL_TABLET | Freq: Once | ORAL | Status: DC | PRN

## 2021-07-09 MED ORDER — PHENYLEPHRINE HCL-NACL 20-0.9 MG/250ML-% IV SOLN
INTRAVENOUS | Status: DC | PRN
  Administered 2021-07-09: 50 ug/min via INTRAVENOUS

## 2021-07-09 MED ORDER — PROMETHAZINE HCL 25 MG/ML IJ SOLN: 6.2500 mg | INTRAMUSCULAR | Status: DC | PRN

## 2021-07-09 MED ORDER — MIDAZOLAM HCL 2 MG/2ML IJ SOLN
INTRAMUSCULAR | Status: DC | PRN
  Administered 2021-07-09: 2 mg via INTRAVENOUS

## 2021-07-09 MED ORDER — INSULIN ASPART 100 UNIT/ML IJ SOLN
0.0000 [IU] | INTRAMUSCULAR | Status: DC | PRN
  Administered 2021-07-09: 2 [IU] via SUBCUTANEOUS
  Filled 2021-07-09: qty 1

## 2021-07-09 MED ORDER — CEFAZOLIN SODIUM 1 G IJ SOLR
INTRAMUSCULAR | Status: AC
  Filled 2021-07-09: qty 20

## 2021-07-09 MED ORDER — HEPARIN 6000 UNIT IRRIGATION SOLUTION
Status: DC | PRN
  Administered 2021-07-09: 1

## 2021-07-09 MED ORDER — PROPOFOL 10 MG/ML IV BOLUS
INTRAVENOUS | Status: AC
Start: 2021-07-09 — End: ?
  Filled 2021-07-09: qty 20

## 2021-07-09 MED ORDER — CEFAZOLIN SODIUM-DEXTROSE 2-3 GM-%(50ML) IV SOLR
INTRAVENOUS | Status: DC | PRN
  Administered 2021-07-09: 2 g via INTRAVENOUS

## 2021-07-09 MED ORDER — LIDOCAINE 2% (20 MG/ML) 5 ML SYRINGE
INTRAMUSCULAR | Status: DC | PRN
  Administered 2021-07-09: 60 mg via INTRAVENOUS

## 2021-07-09 MED ORDER — LIDOCAINE 2% (20 MG/ML) 5 ML SYRINGE
INTRAMUSCULAR | Status: AC
  Filled 2021-07-09: qty 5

## 2021-07-09 MED ORDER — EPHEDRINE 5 MG/ML INJ
INTRAVENOUS | Status: AC
  Filled 2021-07-09: qty 5

## 2021-07-09 MED ORDER — HEPARIN SODIUM (PORCINE) 1000 UNIT/ML IJ SOLN
INTRAMUSCULAR | Status: DC | PRN
  Administered 2021-07-09: 3200 [IU]

## 2021-07-09 MED ORDER — PROPOFOL 10 MG/ML IV BOLUS
INTRAVENOUS | Status: AC
  Filled 2021-07-09: qty 20

## 2021-07-09 MED ORDER — ONDANSETRON HCL 4 MG/2ML IJ SOLN
INTRAMUSCULAR | Status: DC | PRN
  Administered 2021-07-09: 4 mg via INTRAVENOUS

## 2021-07-09 MED ORDER — CHLORHEXIDINE GLUCONATE 0.12 % MT SOLN: 15.0000 mL | Freq: Once | OROMUCOSAL | Status: AC

## 2021-07-09 MED ORDER — IODIXANOL 320 MG/ML IV SOLN
INTRAVENOUS | Status: DC | PRN
  Administered 2021-07-09: 5 mL via INTRAVENOUS

## 2021-07-09 MED ORDER — SODIUM CHLORIDE 0.9% IV SOLUTION: Freq: Once | INTRAVENOUS | Status: DC

## 2021-07-09 MED ORDER — ORAL CARE MOUTH RINSE: 15.0000 mL | Freq: Once | OROMUCOSAL | Status: AC

## 2021-07-09 MED ORDER — HEPARIN SODIUM (PORCINE) 1000 UNIT/ML IJ SOLN
INTRAMUSCULAR | Status: AC
  Filled 2021-07-09: qty 10

## 2021-07-09 MED ORDER — FENTANYL CITRATE (PF) 250 MCG/5ML IJ SOLN
INTRAMUSCULAR | Status: DC | PRN
  Administered 2021-07-09 (×2): 50 ug via INTRAVENOUS

## 2021-07-09 MED ORDER — HEPARIN SODIUM (PORCINE) 1000 UNIT/ML IJ SOLN
INTRAMUSCULAR | Status: DC | PRN
  Administered 2021-07-09: 4200 [IU]

## 2021-07-09 MED ORDER — SODIUM CHLORIDE 0.9 % IV SOLN: INTRAVENOUS | Status: DC | PRN

## 2021-07-09 MED ORDER — HEPARIN 6000 UNIT IRRIGATION SOLUTION
Status: AC
  Filled 2021-07-09: qty 500

## 2021-07-09 MED ORDER — FENTANYL CITRATE (PF) 250 MCG/5ML IJ SOLN
INTRAMUSCULAR | Status: AC
  Filled 2021-07-09: qty 5

## 2021-07-09 MED ORDER — HEPARIN SODIUM (PORCINE) 1000 UNIT/ML IJ SOLN
INTRAMUSCULAR | Status: AC
  Filled 2021-07-09: qty 1

## 2021-07-09 MED ORDER — OXYCODONE HCL 5 MG/5ML PO SOLN: 5.0000 mg | Freq: Once | ORAL | Status: DC | PRN

## 2021-07-09 MED ORDER — HEPARIN SODIUM (PORCINE) 1000 UNIT/ML DIALYSIS
2000.0000 [IU] | INTRAMUSCULAR | Status: DC | PRN
  Filled 2021-07-09: qty 2

## 2021-07-09 MED ORDER — EPHEDRINE SULFATE-NACL 50-0.9 MG/10ML-% IV SOSY
PREFILLED_SYRINGE | INTRAVENOUS | Status: DC | PRN
  Administered 2021-07-09: 10 mg via INTRAVENOUS

## 2021-07-09 MED ORDER — HYDROMORPHONE HCL 1 MG/ML IJ SOLN: 0.2500 mg | INTRAMUSCULAR | Status: DC | PRN

## 2021-07-09 SURGICAL SUPPLY — 44 items
BAG DECANTER FOR FLEXI CONT (MISCELLANEOUS) ×2 IMPLANT
BIOPATCH RED 1 DISK 7.0 (GAUZE/BANDAGES/DRESSINGS) ×2 IMPLANT
BLADE SURG 11 STRL SS (BLADE) ×1 IMPLANT
CATH ANGIO BERNSTEIN 5X40X.035 (CATHETERS) ×1 IMPLANT
CATH PALINDROME-P 28CM W/VT (CATHETERS) ×1 IMPLANT
COVER DOME SNAP 22 D (MISCELLANEOUS) ×1 IMPLANT
COVER PROBE W GEL 5X96 (DRAPES) ×2 IMPLANT
COVER SURGICAL LIGHT HANDLE (MISCELLANEOUS) ×2 IMPLANT
DERMABOND ADVANCED (GAUZE/BANDAGES/DRESSINGS) ×1
DERMABOND ADVANCED .7 DNX12 (GAUZE/BANDAGES/DRESSINGS) IMPLANT
DEVICE TORQUE H2O (MISCELLANEOUS) ×1 IMPLANT
DRAPE CHEST BREAST 15X10 FENES (DRAPES) ×2 IMPLANT
DRSG COVADERM 4X6 (GAUZE/BANDAGES/DRESSINGS) ×1 IMPLANT
GAUZE 4X4 16PLY ~~LOC~~+RFID DBL (SPONGE) ×2 IMPLANT
GAUZE SPONGE 4X4 12PLY STRL (GAUZE/BANDAGES/DRESSINGS) ×2 IMPLANT
GLOVE BIO SURGEON STRL SZ8 (GLOVE) ×1 IMPLANT
GOWN STRL REUS W/ TWL LRG LVL3 (GOWN DISPOSABLE) ×1 IMPLANT
GOWN STRL REUS W/ TWL XL LVL3 (GOWN DISPOSABLE) ×1 IMPLANT
GOWN STRL REUS W/TWL LRG LVL3 (GOWN DISPOSABLE) ×1
GOWN STRL REUS W/TWL XL LVL3 (GOWN DISPOSABLE) ×1
GUIDEWIRE ANGLED .035X150CM (WIRE) ×1 IMPLANT
KIT BASIN OR (CUSTOM PROCEDURE TRAY) ×2 IMPLANT
KIT TURNOVER KIT B (KITS) ×2 IMPLANT
NDL 18GX1X1/2 (RX/OR ONLY) (NEEDLE) ×1 IMPLANT
NDL HYPO 25GX1X1/2 BEV (NEEDLE) ×1 IMPLANT
NEEDLE 18GX1X1/2 (RX/OR ONLY) (NEEDLE) ×2 IMPLANT
NEEDLE HYPO 25GX1X1/2 BEV (NEEDLE) ×2 IMPLANT
NS IRRIG 1000ML POUR BTL (IV SOLUTION) ×2 IMPLANT
PACK SURGICAL SETUP 50X90 (CUSTOM PROCEDURE TRAY) ×2 IMPLANT
PAD ARMBOARD 7.5X6 YLW CONV (MISCELLANEOUS) ×4 IMPLANT
SET MICROPUNCTURE 5F STIFF (MISCELLANEOUS) ×2 IMPLANT
SHEATH PINNACLE 5F 10CM (SHEATH) ×1 IMPLANT
STRIP CLOSURE SKIN 1/2X4 (GAUZE/BANDAGES/DRESSINGS) ×1 IMPLANT
SUT ETHILON 3 0 PS 1 (SUTURE) ×2 IMPLANT
SUT MNCRL AB 4-0 PS2 18 (SUTURE) ×2 IMPLANT
SYR 10ML LL (SYRINGE) ×1 IMPLANT
SYR 20ML LL LF (SYRINGE) ×3 IMPLANT
SYR 5ML LL (SYRINGE) ×2 IMPLANT
SYR CONTROL 10ML LL (SYRINGE) ×2 IMPLANT
TOWEL GREEN STERILE (TOWEL DISPOSABLE) ×2 IMPLANT
TOWEL GREEN STERILE FF (TOWEL DISPOSABLE) ×4 IMPLANT
WATER STERILE IRR 1000ML POUR (IV SOLUTION) ×2 IMPLANT
WIRE ROSEN 145CM (WIRE) ×1 IMPLANT
WIRE TORQFLEX AUST .018X40CM (WIRE) ×1 IMPLANT

## 2021-07-09 SURGICAL SUPPLY — 47 items
ARMBAND PINK RESTRICT EXTREMIT (MISCELLANEOUS) ×3 IMPLANT
BAG DECANTER FOR FLEXI CONT (MISCELLANEOUS) ×3 IMPLANT
BIOPATCH RED 1 DISK 7.0 (GAUZE/BANDAGES/DRESSINGS) ×3 IMPLANT
CANISTER SUCT 3000ML PPV (MISCELLANEOUS) ×3 IMPLANT
CANNULA VESSEL 3MM 2 BLNT TIP (CANNULA) ×3 IMPLANT
CATH PALINDROME-P 19CM W/VT (CATHETERS) ×2 IMPLANT
CHLORAPREP W/TINT 26 (MISCELLANEOUS) ×3 IMPLANT
COVER DOME SNAP 22 D (MISCELLANEOUS) ×2 IMPLANT
COVER PROBE W GEL 5X96 (DRAPES) ×3 IMPLANT
COVER SURGICAL LIGHT HANDLE (MISCELLANEOUS) ×3 IMPLANT
DERMABOND ADVANCED (GAUZE/BANDAGES/DRESSINGS) ×1
DERMABOND ADVANCED .7 DNX12 (GAUZE/BANDAGES/DRESSINGS) ×1 IMPLANT
DRAPE CHEST BREAST 15X10 FENES (DRAPES) ×3 IMPLANT
DRSG COVADERM 4X6 (GAUZE/BANDAGES/DRESSINGS) ×2 IMPLANT
ELECT REM PT RETURN 9FT ADLT (ELECTROSURGICAL) ×3
ELECTRODE REM PT RTRN 9FT ADLT (ELECTROSURGICAL) ×2 IMPLANT
GAUZE 4X4 16PLY ~~LOC~~+RFID DBL (SPONGE) ×3 IMPLANT
GLOVE BIO SURGEON STRL SZ8 (GLOVE) ×3 IMPLANT
GLOVE SURG SS PI 8.0 STRL IVOR (GLOVE) ×3 IMPLANT
GOWN STRL REUS W/ TWL LRG LVL3 (GOWN DISPOSABLE) ×4 IMPLANT
GOWN STRL REUS W/ TWL XL LVL3 (GOWN DISPOSABLE) ×2 IMPLANT
GOWN STRL REUS W/TWL LRG LVL3 (GOWN DISPOSABLE) ×2
GOWN STRL REUS W/TWL XL LVL3 (GOWN DISPOSABLE) ×1
KIT BASIN OR (CUSTOM PROCEDURE TRAY) ×3 IMPLANT
KIT TURNOVER KIT B (KITS) ×3 IMPLANT
NDL 18GX1X1/2 (RX/OR ONLY) (NEEDLE) ×1 IMPLANT
NDL HYPO 25GX1X1/2 BEV (NEEDLE) ×1 IMPLANT
NEEDLE 18GX1X1/2 (RX/OR ONLY) (NEEDLE) ×3 IMPLANT
NEEDLE HYPO 25GX1X1/2 BEV (NEEDLE) ×3 IMPLANT
NS IRRIG 1000ML POUR BTL (IV SOLUTION) ×3 IMPLANT
PACK CV ACCESS (CUSTOM PROCEDURE TRAY) ×3 IMPLANT
PACK SURGICAL SETUP 50X90 (CUSTOM PROCEDURE TRAY) ×3 IMPLANT
PAD ARMBOARD 7.5X6 YLW CONV (MISCELLANEOUS) ×6 IMPLANT
SET MICROPUNCTURE 5F STIFF (MISCELLANEOUS) ×3 IMPLANT
SUT ETHILON 3 0 PS 1 (SUTURE) ×3 IMPLANT
SUT MNCRL AB 4-0 PS2 18 (SUTURE) ×3 IMPLANT
SUT PROLENE 6 0 BV (SUTURE) ×4 IMPLANT
SUT VIC AB 3-0 SH 27 (SUTURE) ×1
SUT VIC AB 3-0 SH 27X BRD (SUTURE) ×3 IMPLANT
SYR 10ML LL (SYRINGE) ×3 IMPLANT
SYR 20ML LL LF (SYRINGE) ×6 IMPLANT
SYR 5ML LL (SYRINGE) ×3 IMPLANT
SYR CONTROL 10ML LL (SYRINGE) ×3 IMPLANT
TOWEL GREEN STERILE (TOWEL DISPOSABLE) ×3 IMPLANT
TOWEL GREEN STERILE FF (TOWEL DISPOSABLE) ×6 IMPLANT
UNDERPAD 30X36 HEAVY ABSORB (UNDERPADS AND DIAPERS) ×3 IMPLANT
WATER STERILE IRR 1000ML POUR (IV SOLUTION) ×3 IMPLANT

## 2021-07-09 NOTE — Plan of Care (Signed)
  Problem: Nutritional: Goal: Maintenance of adequate nutrition will improve Outcome: Progressing   Problem: Skin Integrity: Goal: Risk for impaired skin integrity will decrease Outcome: Progressing   Problem: Clinical Measurements: Goal: Respiratory complications will improve Outcome: Progressing   Problem: Activity: Goal: Risk for activity intolerance will decrease Outcome: Progressing   Problem: Nutrition: Goal: Adequate nutrition will be maintained Outcome: Progressing   Problem: Coping: Goal: Level of anxiety will decrease Outcome: Progressing   Problem: Elimination: Goal: Will not experience complications related to urinary retention Outcome: Progressing   Problem: Safety: Goal: Ability to remain free from injury will improve Outcome: Progressing

## 2021-07-09 NOTE — Plan of Care (Signed)
  Problem: Education: Goal: Ability to describe self-care measures that may prevent or decrease complications (Diabetes Survival Skills Education) will improve Outcome: Progressing Goal: Individualized Educational Video(s) Outcome: Progressing   Problem: Coping: Goal: Ability to adjust to condition or change in health will improve Outcome: Progressing   Problem: Fluid Volume: Goal: Ability to maintain a balanced intake and output will improve Outcome: Progressing   Problem: Health Behavior/Discharge Planning: Goal: Ability to identify and utilize available resources and services will improve Outcome: Progressing Goal: Ability to manage health-related needs will improve Outcome: Progressing   Problem: Metabolic: Goal: Ability to maintain appropriate glucose levels will improve Outcome: Progressing   Problem: Nutritional: Goal: Maintenance of adequate nutrition will improve Outcome: Progressing Goal: Progress toward achieving an optimal weight will improve Outcome: Progressing   Problem: Skin Integrity: Goal: Risk for impaired skin integrity will decrease Outcome: Progressing   Problem: Tissue Perfusion: Goal: Adequacy of tissue perfusion will improve Outcome: Progressing   Problem: Education: Goal: Knowledge of General Education information will improve Description: Including pain rating scale, medication(s)/side effects and non-pharmacologic comfort measures Outcome: Progressing   Problem: Health Behavior/Discharge Planning: Goal: Ability to manage health-related needs will improve Outcome: Progressing   Problem: Clinical Measurements: Goal: Ability to maintain clinical measurements within normal limits will improve Outcome: Progressing Goal: Will remain free from infection Outcome: Progressing Goal: Diagnostic test results will improve Outcome: Progressing Goal: Respiratory complications will improve Outcome: Progressing Goal: Cardiovascular complication will  be avoided Outcome: Progressing   Problem: Activity: Goal: Risk for activity intolerance will decrease Outcome: Progressing   Problem: Nutrition: Goal: Adequate nutrition will be maintained Outcome: Progressing   Problem: Coping: Goal: Level of anxiety will decrease Outcome: Progressing   Problem: Elimination: Goal: Will not experience complications related to bowel motility Outcome: Progressing Goal: Will not experience complications related to urinary retention Outcome: Progressing   Problem: Pain Managment: Goal: General experience of comfort will improve Outcome: Progressing   Problem: Safety: Goal: Ability to remain free from injury will improve Outcome: Progressing   Problem: Skin Integrity: Goal: Risk for impaired skin integrity will decrease Outcome: Progressing   Problem: Fluid Volume: Goal: Hemodynamic stability will improve Outcome: Progressing   Problem: Clinical Measurements: Goal: Diagnostic test results will improve Outcome: Progressing Goal: Signs and symptoms of infection will decrease Outcome: Progressing   Problem: Respiratory: Goal: Ability to maintain adequate ventilation will improve Outcome: Progressing   

## 2021-07-09 NOTE — Anesthesia Preprocedure Evaluation (Signed)
Anesthesia Evaluation  Patient identified by MRN, date of birth, ID band Patient awake    Reviewed: Allergy & Precautions, NPO status , Patient's Chart, lab work & pertinent test results  Airway Mallampati: II  TM Distance: >3 FB Neck ROM: Full    Dental no notable dental hx.    Pulmonary asthma ,    Pulmonary exam normal        Cardiovascular hypertension, Pt. on medications  Rhythm:Regular Rate:Normal     Neuro/Psych Anxiety Depression negative neurological ROS     GI/Hepatic negative GI ROS, Neg liver ROS,   Endo/Other  diabetes, Type 1, Insulin Dependent  Renal/GU ESRF and DialysisRenal disease  negative genitourinary   Musculoskeletal negative musculoskeletal ROS (+)   Abdominal Normal abdominal exam  (+)   Peds  Hematology  (+) Blood dyscrasia, anemia , Bacteremia    Anesthesia Other Findings   Reproductive/Obstetrics                             Anesthesia Physical  Anesthesia Plan  ASA: 4  Anesthesia Plan: General   Post-op Pain Management: Dilaudid IV and Minimal or no pain anticipated   Induction: Intravenous  PONV Risk Score and Plan: 3 and Treatment may vary due to age or medical condition, Ondansetron, Dexamethasone and Midazolam  Airway Management Planned: LMA  Additional Equipment: None  Intra-op Plan:   Post-operative Plan: Extubation in OR  Informed Consent: I have reviewed the patients History and Physical, chart, labs and discussed the procedure including the risks, benefits and alternatives for the proposed anesthesia with the patient or authorized representative who has indicated his/her understanding and acceptance.     Dental advisory given  Plan Discussed with:   Anesthesia Plan Comments:         Anesthesia Quick Evaluation

## 2021-07-09 NOTE — Procedures (Addendum)
Seen on HD to supervise treatment.  Catheter poorly functional. 140/87. HR 87. New RIJ catheter in use.  Tech has already reversed and with high pressures.  Called vascular surgery.  We ultimately rinsed her back      Vascular is now at bedside.  Appreciate his assistance.    Claudia Desanctis, MD 10:34 AM 07/09/2021   Appreciate vascular.  Fibrin was present and catheter is flushing now.  We are going to try to get it going again and if issues vascular is able to place another catheter on left chest if needed.   Claudia Desanctis, MD 07/09/2021 10:40 AM   Catheter is unfortunately not functional.  Discussed with vascular and Dr. Stanford Breed is going to place a tunneled catheter on the left.  Will keep her NPO  Claudia Desanctis, MD 11:06 AM 07/09/2021

## 2021-07-09 NOTE — Progress Notes (Signed)
Pt arrived from dialysis to short stay for replacement of tunneled dialysis cath. Pt already received 1 unit of blood during first surgery.  An additional unit of PRBC's ordered by nephrologist to be given during dialysis. Per anesthesia MD, ok for pt to wait and get blood transfusion in dialysis. Per Dr. Sabra Heck, istat not needed prior to going back to the OR.  Telephone consent obtained from pt's sig other d/t recent sedation meds received. Pt is agreeable to surgery and aware of plan.

## 2021-07-09 NOTE — Progress Notes (Signed)
[facility] for Infectious Disease   Reason for visit: follow up on bacteremia  Interval History: WBC 12.7, remains afebrile > 72 hours Day 6 total antibiotics  Physical Exam: Constitutional:  Vitals:   07/09/21 1327 07/09/21 1342  BP: 127/76 122/74  Pulse: 80 76  Resp: 16 14  Temp: 98 F (36.7 C)   SpO2: 94% 93%  She is in NAD Respiratory: normal respiratory effort  Review of Systems: Constitutional: negative for fever or chills  Lab Results  Component Value Date   WBC 12.7 (H) 07/09/2021   HGB 6.9 (LL) 07/09/2021   HCT 20.7 (L) 07/09/2021   MCV 83.5 07/09/2021   PLT 549 (H) 07/09/2021    Lab Results  Component Value Date   CREATININE 12.10 (H) 07/09/2021   BUN 90 (H) 07/09/2021   NA 130 (L) 07/09/2021   K 3.7 07/09/2021   CL 93 (L) 07/09/2021   CO2 21 (L) 07/09/2021    Lab Results  Component Value Date   ALT 16 07/03/2021   AST 16 07/03/2021   ALKPHOS 57 07/03/2021     Microbiology: Recent Results (from the past 240 hour(s))  Culture, blood (Routine X 2) w Reflex to ID Panel     Status: Abnormal   Collection Time: 07/03/21  3:48 AM   Specimen: BLOOD RIGHT WRIST  Result Value Ref Range Status   Specimen Description   Final    BLOOD RIGHT WRIST Performed at [facility], [place] [address], [place], [place] [address]    Special Requests   Final    BOTTLES DRAWN AEROBIC AND ANAEROBIC Blood Culture adequate volume Performed at [facility] [address] [place], [place] [address]    Culture  Setup Time   Final    GRAM POSITIVE COCCI IN CLUSTERS IN BOTH AEROBIC AND ANAEROBIC BOTTLES CRITICAL RESULT CALLED TO, READ BACK BY AND VERIFIED WITH: PHARMD GRACE BARR ON 07/03/21 @ 1750 BY DRT    Culture (A)  Final    STAPHYLOCOCCUS AUREUS SUSCEPTIBILITIES PERFORMED ON PREVIOUS CULTURE WITHIN THE LAST 5 DAYS. Performed at [facility], [place] [address], [place], [place] [address]    Report Status 07/05/2021  FINAL  Final  Culture, blood (Routine X 2) w Reflex to ID Panel     Status: Abnormal   Collection Time: 07/03/21  4:15 AM   Specimen: BLOOD  Result Value Ref Range Status   Specimen Description   Final    BLOOD RIGHT ANTECUBITAL Performed at [facility] [address], [place], [place] [address]    Special Requests   Final    BOTTLES DRAWN AEROBIC AND ANAEROBIC Blood Culture adequate volume Performed at [facility] [address] [place], [place] [address]    Culture  Setup Time   Final    GRAM POSITIVE COCCI IN CLUSTERS IN BOTH AEROBIC AND ANAEROBIC BOTTLES CRITICAL RESULT CALLED TO, READ BACK BY AND VERIFIED WITH: PHARMD GRACE BARR ON 07/03/21 @ 1749 BY DRT Performed at [facility], [place] [address], [place], [place] [address]    Culture STAPHYLOCOCCUS AUREUS (A)  Final   Report Status 07/05/2021 FINAL  Final   Organism ID, Bacteria STAPHYLOCOCCUS AUREUS  Final      Susceptibility   Staphylococcus aureus - MIC*    CIPROFLOXACIN <=0.5 SENSITIVE Sensitive     ERYTHROMYCIN >=8 RESISTANT Resistant     GENTAMICIN <=0.5 SENSITIVE Sensitive     OXACILLIN <=  0.25 SENSITIVE Sensitive     TETRACYCLINE >=16 RESISTANT Resistant     VANCOMYCIN 1 SENSITIVE Sensitive     TRIMETH/SULFA <=10 SENSITIVE Sensitive     CLINDAMYCIN <=0.25 SENSITIVE Sensitive     RIFAMPIN <=0.5 SENSITIVE Sensitive     Inducible Clindamycin NEGATIVE Sensitive     * STAPHYLOCOCCUS AUREUS  Blood Culture ID Panel (Reflexed)     Status: Abnormal   Collection Time: 07/03/21  4:15 AM  Result Value Ref Range Status   Enterococcus faecalis NOT DETECTED NOT DETECTED Final   Enterococcus Faecium NOT DETECTED NOT DETECTED Final   Listeria monocytogenes NOT DETECTED NOT DETECTED Final   Staphylococcus species DETECTED (A) NOT DETECTED Final    Comment: CRITICAL RESULT CALLED TO, READ BACK BY AND VERIFIED WITH: PHARMD GRACE BARR ON 07/03/21 @ 1749 BY DRT    Staphylococcus aureus  (BCID) DETECTED (A) NOT DETECTED Final    Comment: CRITICAL RESULT CALLED TO, READ BACK BY AND VERIFIED WITH: PHARMD GRACE BARR ON 07/03/21 @ 1749 BY DRT    Staphylococcus epidermidis NOT DETECTED NOT DETECTED Final   Staphylococcus lugdunensis NOT DETECTED NOT DETECTED Final   Streptococcus species NOT DETECTED NOT DETECTED Final   Streptococcus agalactiae NOT DETECTED NOT DETECTED Final   Streptococcus pneumoniae NOT DETECTED NOT DETECTED Final   Streptococcus pyogenes NOT DETECTED NOT DETECTED Final   A.calcoaceticus-baumannii NOT DETECTED NOT DETECTED Final   Bacteroides fragilis NOT DETECTED NOT DETECTED Final   Enterobacterales NOT DETECTED NOT DETECTED Final   Enterobacter cloacae complex NOT DETECTED NOT DETECTED Final   Escherichia coli NOT DETECTED NOT DETECTED Final   Klebsiella aerogenes NOT DETECTED NOT DETECTED Final   Klebsiella oxytoca NOT DETECTED NOT DETECTED Final   Klebsiella pneumoniae NOT DETECTED NOT DETECTED Final   Proteus species NOT DETECTED NOT DETECTED Final   Salmonella species NOT DETECTED NOT DETECTED Final   Serratia marcescens NOT DETECTED NOT DETECTED Final   Haemophilus influenzae NOT DETECTED NOT DETECTED Final   Neisseria meningitidis NOT DETECTED NOT DETECTED Final   Pseudomonas aeruginosa NOT DETECTED NOT DETECTED Final   Stenotrophomonas maltophilia NOT DETECTED NOT DETECTED Final   Candida albicans NOT DETECTED NOT DETECTED Final   Candida auris NOT DETECTED NOT DETECTED Final   Candida glabrata NOT DETECTED NOT DETECTED Final   Candida krusei NOT DETECTED NOT DETECTED Final   Candida parapsilosis NOT DETECTED NOT DETECTED Final   Candida tropicalis NOT DETECTED NOT DETECTED Final   Cryptococcus neoformans/gattii NOT DETECTED NOT DETECTED Final   Meth resistant mecA/C and MREJ NOT DETECTED NOT DETECTED Final    Comment: Performed at [facility], [address], [place], [place] [address]  SARS Coronavirus 2 by RT PCR (hospital order,  performed in [facility] hospital lab) *cepheid single result test* Anterior Nasal Swab     Status: None   Collection Time: 07/03/21  6:30 AM   Specimen: Anterior Nasal Swab  Result Value Ref Range Status   SARS Coronavirus 2 by RT PCR NEGATIVE NEGATIVE Final    Comment: (NOTE) SARS-CoV-2 target nucleic acids are NOT DETECTED.  The SARS-CoV-2 RNA is generally detectable in upper and lower respiratory specimens during the acute phase of infection. The lowest concentration of SARS-CoV-2 viral copies this assay can detect is 250 copies / mL. A negative result does not preclude SARS-CoV-2 infection and should not be used as the sole basis for treatment or other patient management decisions.  A negative result may occur with improper specimen collection /  handling, submission of specimen other than nasopharyngeal swab, presence of viral mutation(s) within the areas targeted by this assay, and inadequate number of viral copies (<250 copies / mL). A negative result must be combined with clinical observations, patient history, and epidemiological information.  Fact Sheet for Patients:   https://www.patel.info/  Fact Sheet for Healthcare Providers: https://hall.com/  This test is not yet approved or  cleared by the [place] FDA and has been authorized for detection and/or diagnosis of SARS-CoV-2 by FDA under an Emergency Use Authorization (EUA).  This EUA will remain in effect (meaning this test can be used) for the duration of the COVID-19 declaration under Section 564(b)(1) of the Act, 21 U.S.C. section 360bbb-3(b)(1), unless the authorization is terminated or revoked sooner.  Performed at [facility], [place] [address] [place], [place] [address]   Culture, [place] Urine     Status: None   Collection Time: 07/03/21 11:18 AM   Specimen: Urine, Random  Result Value Ref Range Status   Specimen Description   Final    URINE,  RANDOM Performed at [facility] [address] [place], [place] [address]    Special Requests   Final    NONE Performed at [facility], [place] [address], [place], [place] [address]    Culture   Final    NO GROWTH NO GROUP B STREP (S.AGALACTIAE) ISOLATED Performed at [facility], [place] [address] [place], [place] [address]    Report Status 07/04/2021 FINAL  Final  Culture, blood (Routine X 2) w Reflex to ID Panel     Status: None (Preliminary result)   Collection Time: 07/05/21  1:48 AM   Specimen: BLOOD RIGHT HAND  Result Value Ref Range Status   Specimen Description BLOOD RIGHT HAND  Final   Special Requests   Final    AEROBIC BOTTLE ONLY Blood Culture results may not be optimal due to an inadequate volume of blood received in culture bottles   Culture   Final    NO GROWTH 4 DAYS Performed at [facility], [place] [address] [place], [place] [address]    Report Status PENDING  Incomplete  Culture, blood (Routine X 2) w Reflex to ID Panel     Status: None (Preliminary result)   Collection Time: 07/05/21  1:59 AM   Specimen: BLOOD LEFT HAND  Result Value Ref Range Status   Specimen Description BLOOD LEFT HAND  Final   Special Requests   Final    AEROBIC BOTTLE ONLY Blood Culture results may not be optimal due to an inadequate volume of blood received in culture bottles   Culture   Final    NO GROWTH 4 DAYS Performed at [facility], [place] [address], [place], [place] [address]    Report Status PENDING  Incomplete  Aerobic/Anaerobic Culture w Gram Stain (surgical/deep wound)     Status: None (Preliminary result)   Collection Time: 07/05/21  8:09 AM   Specimen: PATH Other; Tissue  Result Value Ref Range Status   Specimen Description HEMODIALYSIS CATHETER  Final   Special Requests ANTIBIOTIC TREATMENT ANCEF  Final   Gram Stain   Final    RARE WBC PRESENT,BOTH PMN AND MONONUCLEAR RARE GRAM POSITIVE COCCI IN PAIRS Performed  at [facility], [place] [address], [place], [place] [address]    Culture   Final    RARE STAPHYLOCOCCUS AUREUS NO ANAEROBES ISOLATED; CULTURE IN PROGRESS FOR 5 DAYS  Report Status PENDING  Incomplete   Organism ID, Bacteria STAPHYLOCOCCUS AUREUS  Final      Susceptibility   Staphylococcus aureus - MIC*    CIPROFLOXACIN <=0.5 SENSITIVE Sensitive     ERYTHROMYCIN >=8 RESISTANT Resistant     GENTAMICIN <=0.5 SENSITIVE Sensitive     OXACILLIN <=0.25 SENSITIVE Sensitive     TETRACYCLINE 8 INTERMEDIATE Intermediate     VANCOMYCIN <=0.5 SENSITIVE Sensitive     TRIMETH/SULFA <=10 SENSITIVE Sensitive     CLINDAMYCIN <=0.25 SENSITIVE Sensitive     RIFAMPIN <=0.5 SENSITIVE Sensitive     Inducible Clindamycin NEGATIVE Sensitive     * RARE STAPHYLOCOCCUS AUREUS    Impression/Plan:  1. MSSA bacteremia - TEE negative for vegetation.  Repeat blood cultures ngtd.  No further fever, no leukocytosis.   Continue with cefazolin for 2 weeks through July 1st then stop.    2.  ESRD - new catheter placed and on dialysis during exam.  Getting antibiotics as above.    3.  Line infection - catheter that was removed also grew MSSA c/w line infection as in #1.    I will sign off, call with questions

## 2021-07-09 NOTE — Progress Notes (Signed)
Received order to give a unit of blood for pt but it was not ready to be administered on the unit before the pt went to OR for fistula placement.I communicated this information with OR staff.

## 2021-07-09 NOTE — Anesthesia Postprocedure Evaluation (Signed)
Anesthesia Post Note  Patient: Barbara Donovan  Procedure(s) Performed: INSERTION OF TUNNELED DIALYSIS CATHETER (Right: Neck)     Patient location during evaluation: PACU Anesthesia Type: General Level of consciousness: awake and alert Pain management: pain level controlled Vital Signs Assessment: post-procedure vital signs reviewed and stable Respiratory status: spontaneous breathing, nonlabored ventilation and respiratory function stable Cardiovascular status: blood pressure returned to baseline and stable Postop Assessment: no apparent nausea or vomiting Anesthetic complications: no   No notable events documented.  Last Vitals:  Vitals:   07/09/21 0930 07/09/21 1004  BP: 135/82 (!) 144/89  Pulse: 73 76  Resp: 15 16  Temp: 36.6 C 36.5 C  SpO2: 97% 96%    Last Pain:  Vitals:   07/09/21 1025  TempSrc:   PainSc: 0-No pain                 Lynda Rainwater

## 2021-07-09 NOTE — Transfer of Care (Signed)
Immediate Anesthesia Transfer of Care Note  Patient: Barbara Donovan  Procedure(s) Performed: INSERTION OF TUNNELED  DIALYSIS CATHETER (Left: Neck)  Patient Location: PACU  Anesthesia Type:General  Level of Consciousness: drowsy and patient cooperative  Airway & Oxygen Therapy: Patient Spontanous Breathing  Post-op Assessment: Report given to RN and Post -op Vital signs reviewed and stable  Post vital signs: Reviewed and stable  Last Vitals:  Vitals Value Taken Time  BP 127/76 07/09/21 1327  Temp    Pulse 78 07/09/21 1328  Resp 15 07/09/21 1328  SpO2 94 % 07/09/21 1328  Vitals shown include unvalidated device data.  Last Pain:  Vitals:   07/09/21 1150  TempSrc:   PainSc: 5       Patients Stated Pain Goal: 0 (82/08/13 8871)  Complications: No notable events documented.

## 2021-07-09 NOTE — Anesthesia Procedure Notes (Signed)
Procedure Name: LMA Insertion Date/Time: 07/09/2021 12:27 PM  Performed by: Lance Coon, CRNAPre-anesthesia Checklist: Patient identified, Emergency Drugs available, Suction available, Patient being monitored and Timeout performed Patient Re-evaluated:Patient Re-evaluated prior to induction Oxygen Delivery Method: Circle system utilized Preoxygenation: Pre-oxygenation with 100% oxygen Induction Type: IV induction LMA: LMA inserted LMA Size: 3.0 Number of attempts: 1 Placement Confirmation: positive ETCO2 and breath sounds checked- equal and bilateral Tube secured with: Tape Dental Injury: Teeth and Oropharynx as per pre-operative assessment

## 2021-07-09 NOTE — Progress Notes (Signed)
Spoke with charge nurse at dialysis stating " take patient back to her room

## 2021-07-09 NOTE — Progress Notes (Signed)
Patient presented to HD unit for treatment shortly after having tunneled HD cath (right neck) inserted. CCHT Aniceto Boss attempted to connect her to initiate treatment, arterial pressure was high, causing treatment to be continuously interrupted. MD and NP were advised. Surgeon came to assess patient's catheter. Was advised to have her sent back to surgery to have tunneled dialysis cath inserted in left neck. Treatment unsuccessful. Patient rescheduled for HD treatment.

## 2021-07-09 NOTE — Op Note (Addendum)
DATE OF SERVICE: 07/09/2021  PATIENT:  Barbara Donovan  31 y.o. female  PRE-OPERATIVE DIAGNOSIS:  ESRD  POST-OPERATIVE DIAGNOSIS:  Same  PROCEDURE:   1) central venogram 2) placement of left internal jugular tunneled dialysis catheter (28cm) 3) removal of right internal jugular tunneled dialysis catheter  SURGEON:  Surgeon(s) and Role:    * Cherre Robins, MD - Primary  ASSISTANT: none  ANESTHESIA:   general  EBL: minimal  BLOOD ADMINISTERED:none  DRAINS: none   LOCAL MEDICATIONS USED:  NONE  SPECIMEN:  none  COUNTS: confirmed correct.  TOURNIQUET:  none  PATIENT DISPOSITION:  PACU - hemodynamically stable.   Delay start of Pharmacological VTE agent (>24hrs) due to surgical blood loss or risk of bleeding: no  INDICATION FOR PROCEDURE: Barbara Donovan is a 31 y.o. female with ESRD. I placed a tunneled catheter earlier today which is not functional. After careful discussion of risks, benefits, and alternatives the patient was offered replacement of tunneled dialysis catheter. The patient understood and wished to proceed.  OPERATIVE FINDINGS: right tunneled catheter appeared partially kinked on fluoroscopy. Central venogram shows non-occlusive filling defect about sino-caval junction - either fibrin sheath or clot. Selected the IVC via left jugular access. 28cm catheter placed into IVC past sinocaval junction. Catheter tested at completion and functions well.   DESCRIPTION OF PROCEDURE: After identification of the patient in the pre-operative holding area, the patient was transferred to the operating room. The patient was positioned supine on the operating room table. Anesthesia was induced. The neck and chest was prepped and draped in standard fashion. A surgical pause was performed confirming correct patient, procedure, and operative location.  Using ultrasound guidance the left internal jugular vein was accessed with micropuncture technique.  Through the micropuncture  sheath a floppy J-wire was advanced into the superior vena cava.  The micropuncture sheath was removed and a 5 French sheath delivered into the left jugular vein.  Through this sheath I navigated a Glidewire and a Kumpe catheter into the inferior vena cava.  I exchanged the wire for a Rosen wire.  A small incision was made around the skin access point.  The access point was serially dilated under direct fluoroscopic guidance.  A peel-away sheath was introduced into the inferior vena cava under fluoroscopic guidance.  A counterincision was made in the chest under the clavicle.  A 28 cm tunnel dialysis catheter was then tunneled under the skin, over the clavicle into the incision in the neck.  The tunneling device was removed and the catheter fed through the peel-away sheath into the inferior vena cava.  The peel-away sheath was removed and the catheter gently pulled back.  Adequate position was confirmed with x-ray.  The catheter was tested and found to flush and draw back well.  Catheter was heparin locked.  Caps were applied.  Catheter was sutured to the skin.  The neck incision was closed with 4-0 Monocryl. I removed the right tunneled dialysis catheter and held pressure on the right jugular vein. Hemostasis was good upon completion.   Upon completion of the case instrument and sharps counts were confirmed correct. The patient was transferred to the PACU in good condition. I was present for all portions of the procedure.  Yevonne Aline. Stanford Breed, MD [facility] Phone Number: [phone] 07/09/2021 1:14 PM

## 2021-07-09 NOTE — Anesthesia Procedure Notes (Signed)
Procedure Name: LMA Insertion Date/Time: 07/09/2021 8:04 AM  Performed by: Lance Coon, CRNAPre-anesthesia Checklist: Patient identified, Emergency Drugs available, Suction available, Patient being monitored and Timeout performed Patient Re-evaluated:Patient Re-evaluated prior to induction Oxygen Delivery Method: Circle system utilized Preoxygenation: Pre-oxygenation with 100% oxygen Induction Type: IV induction LMA: LMA inserted LMA Size: 3.0 Number of attempts: 1 Placement Confirmation: positive ETCO2 and breath sounds checked- equal and bilateral Tube secured with: Tape Dental Injury: Teeth and Oropharynx as per pre-operative assessment

## 2021-07-09 NOTE — Anesthesia Postprocedure Evaluation (Signed)
Anesthesia Post Note  Patient: Barbara Donovan  Procedure(s) Performed: INSERTION OF TUNNELED  DIALYSIS CATHETER (Left: Neck)     Patient location during evaluation: PACU Anesthesia Type: General Level of consciousness: awake and alert, oriented and patient cooperative Pain management: pain level controlled Vital Signs Assessment: post-procedure vital signs reviewed and stable Respiratory status: spontaneous breathing, nonlabored ventilation and respiratory function stable Cardiovascular status: blood pressure returned to baseline and stable Postop Assessment: no apparent nausea or vomiting Anesthetic complications: no   No notable events documented.  Last Vitals:  Vitals:   07/09/21 1357 07/09/21 1423  BP: 124/74 137/87  Pulse: 76 80  Resp: 15 15  Temp: (!) 36.1 C (!) 36.4 C  SpO2: 92% 95%    Last Pain:  Vitals:   07/09/21 1423  TempSrc: Oral  PainSc:                  Pervis Hocking

## 2021-07-09 NOTE — Transfer of Care (Signed)
Immediate Anesthesia Transfer of Care Note  Patient: Barbara Donovan  Procedure(s) Performed: INSERTION OF TUNNELED DIALYSIS CATHETER (Right: Neck)  Patient Location: PACU  Anesthesia Type:General  Level of Consciousness: drowsy and patient cooperative  Airway & Oxygen Therapy: Patient Spontanous Breathing  Post-op Assessment: Report given to RN and Post -op Vital signs reviewed and stable  Post vital signs: Reviewed and stable  Last Vitals:  Vitals Value Taken Time  BP 143/87 07/09/21 0900  Temp    Pulse 76 07/09/21 0902  Resp 11 07/09/21 0902  SpO2 97 % 07/09/21 0902  Vitals shown include unvalidated device data.  Last Pain:  Vitals:   07/08/21 1936  TempSrc:   PainSc: 0-No pain      Patients Stated Pain Goal: 0 (95/63/87 5643)  Complications: No notable events documented.

## 2021-07-09 NOTE — Progress Notes (Signed)
CRITICAL RESULT PROVIDER NOTIFICATION  Test performed and critical result:  6.9  Date and time result received:  07/09/21 0430  Provider name/title: Internal medicine  Date and time provider notified: 07/09/21 [id number]  Date and time provider responded: 07/09/21 0437  Provider response: New orders

## 2021-07-10 ENCOUNTER — Encounter (HOSPITAL_COMMUNITY): Payer: Self-pay | Admitting: Vascular Surgery

## 2021-07-10 ENCOUNTER — Other Ambulatory Visit (HOSPITAL_COMMUNITY): Payer: Self-pay

## 2021-07-10 ENCOUNTER — Other Ambulatory Visit: Payer: Self-pay | Admitting: Student

## 2021-07-10 DIAGNOSIS — Z794 Long term (current) use of insulin: Secondary | ICD-10-CM

## 2021-07-10 LAB — RENAL FUNCTION PANEL
Albumin: 2.2 g/dL — ABNORMAL LOW (ref 3.5–5.0)
Anion gap: 17 — ABNORMAL HIGH (ref 5–15)
BUN: 44 mg/dL — ABNORMAL HIGH (ref 6–20)
CO2: 21 mmol/L — ABNORMAL LOW (ref 22–32)
Calcium: 8.9 mg/dL (ref 8.9–10.3)
Chloride: 94 mmol/L — ABNORMAL LOW (ref 98–111)
Creatinine, Ser: 6.43 mg/dL — ABNORMAL HIGH (ref 0.44–1.00)
GFR, Estimated: 8 mL/min — ABNORMAL LOW (ref >60)
Glucose, Bld: 196 mg/dL — ABNORMAL HIGH (ref 70–99)
Phosphorus: 4.5 mg/dL (ref 2.5–4.6)
Potassium: 3.6 mmol/L (ref 3.5–5.1)
Sodium: 132 mmol/L — ABNORMAL LOW (ref 135–145)

## 2021-07-10 LAB — GLUCOSE, CAPILLARY
Glucose-Capillary: 223 mg/dL — ABNORMAL HIGH (ref 70–99)
Glucose-Capillary: 226 mg/dL — ABNORMAL HIGH (ref 70–99)
Glucose-Capillary: 244 mg/dL — ABNORMAL HIGH (ref 70–99)
Glucose-Capillary: 250 mg/dL — ABNORMAL HIGH (ref 70–99)

## 2021-07-10 LAB — CULTURE, BLOOD (ROUTINE X 2)
Culture: NO GROWTH
Culture: NO GROWTH

## 2021-07-10 LAB — CBC
HCT: 26.8 % — ABNORMAL LOW (ref 36.0–46.0)
Hemoglobin: 8.6 g/dL — ABNORMAL LOW (ref 12.0–15.0)
MCH: 25.5 pg — ABNORMAL LOW (ref 26.0–34.0)
MCHC: 32.1 g/dL (ref 30.0–36.0)
MCV: 79.5 fL — ABNORMAL LOW (ref 80.0–100.0)
Platelets: 531 10*3/uL — ABNORMAL HIGH (ref 150–400)
RBC: 3.37 MIL/uL — ABNORMAL LOW (ref 3.87–5.11)
RDW: 21.6 % — ABNORMAL HIGH (ref 11.5–15.5)
WBC: 15.8 10*3/uL — ABNORMAL HIGH (ref 4.0–10.5)
nRBC: 0 % (ref 0.0–0.2)

## 2021-07-10 LAB — AEROBIC/ANAEROBIC CULTURE W GRAM STAIN (SURGICAL/DEEP WOUND)

## 2021-07-10 MED ORDER — CEFAZOLIN SODIUM-DEXTROSE 1-4 GM/50ML-% IV SOLN: 2.0000 g | INTRAVENOUS | 0 refills | Status: AC | PRN

## 2021-07-10 MED ORDER — CHLORHEXIDINE GLUCONATE CLOTH 2 % EX PADS
6.0000 | MEDICATED_PAD | Freq: Every day | CUTANEOUS | Status: DC
  Administered 2021-07-11: 6 via TOPICAL

## 2021-07-10 MED ORDER — DARBEPOETIN ALFA 100 MCG/0.5ML IJ SOSY: 100.0000 ug | PREFILLED_SYRINGE | Freq: Once | INTRAMUSCULAR | Status: DC

## 2021-07-10 MED ORDER — DARBEPOETIN ALFA 100 MCG/0.5ML IJ SOSY
100.0000 ug | PREFILLED_SYRINGE | INTRAMUSCULAR | Status: DC
  Filled 2021-07-10: qty 0.5

## 2021-07-10 MED ORDER — DARBEPOETIN ALFA 100 MCG/0.5ML IJ SOSY
100.0000 ug | PREFILLED_SYRINGE | Freq: Once | INTRAMUSCULAR | Status: DC
  Filled 2021-07-10: qty 0.5

## 2021-07-10 MED ORDER — CEFAZOLIN SODIUM-DEXTROSE 1-4 GM/50ML-% IV SOLN
1.0000 g | INTRAVENOUS | 0 refills | Status: DC
  Filled 2021-07-10: qty 750, 15d supply, fill #0

## 2021-07-10 MED ORDER — HEPARIN SODIUM (PORCINE) 1000 UNIT/ML IJ SOLN
INTRAMUSCULAR | Status: AC
  Administered 2021-07-10: 2000 [IU] via INTRAVENOUS_CENTRAL
  Filled 2021-07-10: qty 5

## 2021-07-11 LAB — RENAL FUNCTION PANEL
Albumin: 2.1 g/dL — ABNORMAL LOW (ref 3.5–5.0)
Anion gap: 12 (ref 5–15)
BUN: 24 mg/dL — ABNORMAL HIGH (ref 6–20)
CO2: 27 mmol/L (ref 22–32)
Calcium: 8.9 mg/dL (ref 8.9–10.3)
Chloride: 94 mmol/L — ABNORMAL LOW (ref 98–111)
Creatinine, Ser: 4.47 mg/dL — ABNORMAL HIGH (ref 0.44–1.00)
GFR, Estimated: 13 mL/min — ABNORMAL LOW (ref >60)
Glucose, Bld: 217 mg/dL — ABNORMAL HIGH (ref 70–99)
Phosphorus: 3.1 mg/dL (ref 2.5–4.6)
Potassium: 3.7 mmol/L (ref 3.5–5.1)
Sodium: 133 mmol/L — ABNORMAL LOW (ref 135–145)

## 2021-07-11 LAB — GLUCOSE, CAPILLARY: Glucose-Capillary: 181 mg/dL — ABNORMAL HIGH (ref 70–99)

## 2021-07-12 ENCOUNTER — Telehealth: Payer: Self-pay | Admitting: Nephrology

## 2021-07-13 LAB — TYPE AND SCREEN
ABO/RH(D): A POS
Antibody Screen: NEGATIVE
Unit division: 0
Unit division: 0

## 2021-07-13 LAB — BPAM RBC
Blood Product Expiration Date: 202307112359
Blood Product Expiration Date: 202307162359
ISSUE DATE / TIME: 202306220715
Unit Type and Rh: 6200
Unit Type and Rh: 6200

## 2021-07-13 NOTE — Progress Notes (Signed)
Patient was medically stable for discharge on 6/23.  She would receive her HD section and discharged afterward.  Unfortunately her HD section did not started until 8 PM so patient remained in the hospital overnight.  Patient was evaluated bedside.  She appears comfortable in no acute distress.  She has no question or concern.  Patient will continue cefazolin during her HD section.  Physical Exam Constitutional:      General: She is not in acute distress. HENT:     Head: Normocephalic.  Eyes:     General:        Right eye: No discharge.     Conjunctiva/sclera: Conjunctivae normal.  Cardiovascular:     Rate and Rhythm: Normal rate and regular rhythm.  Pulmonary:     Effort: Pulmonary effort is normal. No respiratory distress.  Skin:    General: Skin is warm.  Neurological:     Mental Status: She is alert and oriented to person, place, and time.  Psychiatric:        Mood and Affect: Mood normal.        Behavior: Behavior normal.    -Patient is ready for discharge.  She will be picked up by her family member.  She will have a follow-up appointment with me in [facility] on 07/23/2021.

## 2021-07-23 ENCOUNTER — Encounter: Payer: Self-pay | Admitting: Student

## 2021-07-23 ENCOUNTER — Ambulatory Visit (INDEPENDENT_AMBULATORY_CARE_PROVIDER_SITE_OTHER): Payer: 59 | Admitting: Student

## 2021-07-23 VITALS — BP 132/79 | HR 87 | Temp 98.5°F | Ht 66.0 in | Wt 141.9 lb

## 2021-07-23 DIAGNOSIS — N186 End stage renal disease: Secondary | ICD-10-CM | POA: Diagnosis not present

## 2021-07-23 DIAGNOSIS — Z794 Long term (current) use of insulin: Secondary | ICD-10-CM

## 2021-07-23 DIAGNOSIS — Z992 Dependence on renal dialysis: Secondary | ICD-10-CM

## 2021-07-23 DIAGNOSIS — E1065 Type 1 diabetes mellitus with hyperglycemia: Secondary | ICD-10-CM | POA: Diagnosis not present

## 2021-07-23 DIAGNOSIS — B9561 Methicillin susceptible Staphylococcus aureus infection as the cause of diseases classified elsewhere: Secondary | ICD-10-CM

## 2021-07-23 DIAGNOSIS — M25551 Pain in right hip: Secondary | ICD-10-CM | POA: Insufficient documentation

## 2021-07-23 DIAGNOSIS — M7061 Trochanteric bursitis, right hip: Secondary | ICD-10-CM | POA: Diagnosis not present

## 2021-07-23 DIAGNOSIS — I10 Essential (primary) hypertension: Secondary | ICD-10-CM

## 2021-07-23 DIAGNOSIS — I120 Hypertensive chronic kidney disease with stage 5 chronic kidney disease or end stage renal disease: Secondary | ICD-10-CM | POA: Diagnosis not present

## 2021-07-23 LAB — POCT GLYCOSYLATED HEMOGLOBIN (HGB A1C): Hemoglobin A1C: 7.1 % — AB (ref 4.0–5.6)

## 2021-07-23 LAB — GLUCOSE, CAPILLARY: Glucose-Capillary: 217 mg/dL — ABNORMAL HIGH (ref 70–99)

## 2021-07-23 NOTE — Assessment & Plan Note (Signed)
Patient was admitted in June for MSSA bacteremia from a recently placed HD catheter.  Fortunately endocarditis was ruled out by TTE and TEE.  Patient received IV cefazolin with end date 07/18/2021.  She has a new HD catheter placed when blood culture was cleared and has been receiving HD without.  Today patient reports doing well without any acute issue.  Say that her HD catheter is clean and working properly.

## 2021-07-23 NOTE — Assessment & Plan Note (Signed)
She endorses pain in the lateral side of right hip that started from last hospitalization and persisted until now.  Said the pain has significantly improved after discharge.  She normally does not have pain with walking but yesterday she was limping after walking out of a restaurant.  She has no problem with her right hip today.  She walked from the entrance down to the clinic without any issue.  Denies fever or chills.  Denies any swelling or erythema of the right hip.  Physical exam is benign and reassuring.  She had mild tenderness to palpation to the lateral greater trochanter but no pain of the IT band.  She has pain with internal rotation but not with external rotation.  Normal range of motion of right hip.  Suspect this is greater trochanter bursitis in the setting of long hospitalization.  Advised patient to take Tylenol as needed for pain as well as warm compression.  If her symptoms do not improve in the next few weeks, can consider steroid injection.

## 2021-07-23 NOTE — Assessment & Plan Note (Signed)
Repeat A1c of 7.1.  She denies any hypoglycemic events.  Reports normal p.o. intake.  She has history of gastroparesis but has not been needing Reglan recently.  -Continue Lantus 20 units daily and Humalog 4-3-3 units with meal -Foot exam performed today -A1c in 3 months -Advised patient to bring her glucometer next time

## 2021-07-23 NOTE — Patient Instructions (Addendum)
Barbara Donovan,  It was a pleasure seeing you in the clinic today.  I am glad that you are doing much better.  Here is a summary what we talked about:  1.  Type 1 diabetes: Your A1c is 7.1, you are doing a great job.  Please continue your current medications.  Please bring your glucometer with you next time.  2.  Your blood pressure is also well controlled.  No change is made to your medications.  3.  Please follow-up with vascular surgery team.  You can ask them about permanent access like fistula.  4.  Right hip pain: This is likely just an inflammation of a bursa, which is like a pillow that covers her bone.  Please take Tylenol and warm compress for pain as needed.  If pain does not improve in the next few week, we can try to do a steroid injection.  Please follow-up in 3 months, sooner if needed if right hip pain does not improve  Take care  Dr. Alfonse Spruce

## 2021-07-23 NOTE — Progress Notes (Signed)
CC: Hospital follow-up  HPI:  [name] is a 31 y.o. with past medical history of type 1 diabetes, ESRD on HD who presented to the the clinic today for recent hospital admission for MSSA bacteremia.  Please see problem based charting for detail  Past Medical History:  Diagnosis Date   Asthma    as a child, no problems as an adult, no inhaler   Cataract    NS OU   Chronic hypertension during pregnancy, antepartum 08/19/2017   Dehydration 01/28/2018   Depression during pregnancy, antepartum 07/07/2017   6/20: Short trial of zoloft previously, reports didn't help much but also didn't give it a chance Discussed r/b/a SSRIs in pregnancy, agrees to try Zoloft again, rx sent No SI/HI/red flags   Diabetes ([place])    TYPE I. 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 A2/B GDM)  Required Referrals for A1GDM or A2GDM: [x]  Diabetes Education and Testing Supplies [x]  Nutrition Cousult  For A2/B GDM or higher classes of DM [x]  Diabetes Education and Testing Supplies [x]  Nutrition Counsult [x]  Fetal ECHO after 20 weeks  [x]  Eye exam for retina evaluation - severe retinopathy 7/19  Base   Ventricular septal defect (VSD) of fetus in singleton pregnancy, antepartum 09/30/2017   May go to newborn nursery per Dr. Lenard Simmer Echo prior to discharge   Review of Systems:  per HPI  Physical  Exam:  Vitals:   07/23/21 1501  BP: 132/79  Pulse: 87  Temp: 98.5 F (36.9 C)  TempSrc: Oral  SpO2: 100%  Weight: 141 lb 14.4 oz (64.4 kg)  Height: 5\' 6"  (1.676 m)   Physical Exam Constitutional:      General: She is not in acute distress.    Appearance: She is not ill-appearing.  HENT:     Head: Normocephalic.  Eyes:     General:        Right eye: No discharge.        Left eye: No discharge.     Conjunctiva/sclera: Conjunctivae normal.  Cardiovascular:     Rate and Rhythm: Normal rate and regular rhythm.  Pulmonary:     Effort: Pulmonary effort is normal. No respiratory distress.     Breath sounds: Normal breath sounds. No wheezing.  Abdominal:     General: Bowel sounds are normal. There is no distension.     Palpations: Abdomen is soft.     Tenderness: There is no abdominal tenderness.  Musculoskeletal:        General: Normal range of motion.     Comments: Mild tenderness to palpation in the lateral greater trochanter.  No tenderness of the IT band or gluteal region.  Patient report pain with internal rotation but not with external rotation.  Otherwise normal range of motion of right hip.  Skin:    General: Skin is warm.  Neurological:     General: No  focal deficit present.     Mental Status: She is alert.  Psychiatric:        Mood and Affect: Mood normal.      Assessment & Plan:   See Encounters Tab for problem based charting.  Essential hypertension Her blood pressure is well controlled at 132/79.  -Continue Coreg 25 mg twice daily -Continue amlodipine 10 mg daily -Continue hydralazine 50 mg twice daily  Type 1 diabetes mellitus with hyperglycemia (HCC) Repeat A1c of 7.1.  She denies any hypoglycemic events.  Reports normal p.o. intake.  She has history of gastroparesis but has not been needing Reglan recently.  -Continue Lantus 20 units daily and Humalog 4-3-3 units with meal -Foot exam performed today -A1c in 3 months -Advised patient to bring her  glucometer next time  ESRD on hemodialysis [facility]) Patient was on peritoneal dialysis for many years but recently switched to hemodialysis due to ineffectiveness.  Unfortunately she developed MSSA bacteremia from her dialysis catheter.  She had a new right tunneled catheter placed during last hospitalization and has been receiving HD with that.  Her schedule is Monday Wednesday and Friday.  She last saw Dr. Royce Macadamia on June 3 and has had blood work there.  No signs of volume overload on physical exam.  Since Dr. Royce Macadamia has performed blood work, will obtain record from them.  No change in medications today.  Greater trochanteric pain syndrome of right lower extremity She endorses pain in the lateral side of right hip that started from last hospitalization and persisted until now.  Said the pain has significantly improved after discharge.  She normally does not have pain with walking but yesterday she was limping after walking out of a restaurant.  She has no problem with her right hip today.  She walked from the entrance down to the clinic without any issue.  Denies fever or chills.  Denies any swelling or erythema of the right hip.  Physical exam is benign and reassuring.  She had mild tenderness to palpation to the lateral greater trochanter but no pain of the IT band.  She has pain with internal rotation but not with external rotation.  Normal range of motion of right hip.  Suspect this is greater trochanter bursitis in the setting of long hospitalization.  Advised patient to take Tylenol as needed for pain as well as warm compression.  If her symptoms do not improve in the next few weeks, can consider steroid injection.  MSSA bacteremia Patient was admitted in June for MSSA bacteremia from a recently placed HD catheter.  Fortunately endocarditis was ruled out by TTE and TEE.  Patient received IV cefazolin with end date 07/18/2021.  She has a new HD catheter placed when blood culture was cleared and has  been receiving HD without.  Today patient reports doing well without any acute issue.  Say that her HD catheter is clean and working properly.   Patient discussed with Dr. Dareen Piano

## 2021-07-23 NOTE — Assessment & Plan Note (Signed)
Her blood pressure is well controlled at 132/79.  -Continue Coreg 25 mg twice daily -Continue amlodipine 10 mg daily -Continue hydralazine 50 mg twice daily

## 2021-07-23 NOTE — Assessment & Plan Note (Signed)
Patient was on peritoneal dialysis for many years but recently switched to hemodialysis due to ineffectiveness.  Unfortunately she developed MSSA bacteremia from her dialysis catheter.  She had a new right tunneled catheter placed during last hospitalization and has been receiving HD with that.  Her schedule is Monday Wednesday and Friday.  She last saw Dr. Royce Macadamia on June 3 and has had blood work there.  No signs of volume overload on physical exam.  Since Dr. Royce Macadamia has performed blood work, will obtain record from them.  No change in medications today.

## 2021-07-25 ENCOUNTER — Other Ambulatory Visit: Payer: Self-pay

## 2021-07-25 ENCOUNTER — Encounter (HOSPITAL_BASED_OUTPATIENT_CLINIC_OR_DEPARTMENT_OTHER): Payer: Self-pay

## 2021-07-25 ENCOUNTER — Emergency Department (HOSPITAL_BASED_OUTPATIENT_CLINIC_OR_DEPARTMENT_OTHER)
Admission: EM | Admit: 2021-07-25 | Discharge: 2021-07-25 | Disposition: A | Discharge status: Home / Self Care | Payer: 59 | Attending: Emergency Medicine | Admitting: Emergency Medicine

## 2021-07-25 DIAGNOSIS — Z794 Long term (current) use of insulin: Secondary | ICD-10-CM | POA: Insufficient documentation

## 2021-07-25 DIAGNOSIS — N186 End stage renal disease: Secondary | ICD-10-CM | POA: Diagnosis not present

## 2021-07-25 DIAGNOSIS — R000 Tachycardia, unspecified: Secondary | ICD-10-CM | POA: Insufficient documentation

## 2021-07-25 DIAGNOSIS — R112 Nausea with vomiting, unspecified: Secondary | ICD-10-CM | POA: Diagnosis present

## 2021-07-25 DIAGNOSIS — Z992 Dependence on renal dialysis: Secondary | ICD-10-CM | POA: Diagnosis not present

## 2021-07-25 DIAGNOSIS — E1022 Type 1 diabetes mellitus with diabetic chronic kidney disease: Secondary | ICD-10-CM | POA: Diagnosis not present

## 2021-07-25 DIAGNOSIS — R064 Hyperventilation: Secondary | ICD-10-CM | POA: Insufficient documentation

## 2021-07-25 DIAGNOSIS — E1122 Type 2 diabetes mellitus with diabetic chronic kidney disease: Secondary | ICD-10-CM | POA: Insufficient documentation

## 2021-07-25 LAB — BASIC METABOLIC PANEL
Anion gap: 17 — ABNORMAL HIGH (ref 5–15)
BUN: 19 mg/dL (ref 6–20)
CO2: 29 mmol/L (ref 22–32)
Calcium: 11.2 mg/dL — ABNORMAL HIGH (ref 8.9–10.3)
Chloride: 90 mmol/L — ABNORMAL LOW (ref 98–111)
Creatinine, Ser: 4.7 mg/dL — ABNORMAL HIGH (ref 0.44–1.00)
GFR, Estimated: 12 mL/min — ABNORMAL LOW (ref >60)
Glucose, Bld: 211 mg/dL — ABNORMAL HIGH (ref 70–99)
Potassium: 3.7 mmol/L (ref 3.5–5.1)
Sodium: 136 mmol/L (ref 135–145)

## 2021-07-25 LAB — CBC WITH DIFFERENTIAL/PLATELET
Abs Immature Granulocytes: 0.08 10*3/uL — ABNORMAL HIGH (ref 0.00–0.07)
Basophils Absolute: 0.1 10*3/uL (ref 0.0–0.1)
Basophils Relative: 1 %
Eosinophils Absolute: 0 10*3/uL (ref 0.0–0.5)
Eosinophils Relative: 0 %
HCT: 29.3 % — ABNORMAL LOW (ref 36.0–46.0)
Hemoglobin: 9.3 g/dL — ABNORMAL LOW (ref 12.0–15.0)
Immature Granulocytes: 1 %
Lymphocytes Relative: 22 %
Lymphs Abs: 2.5 10*3/uL (ref 0.7–4.0)
MCH: 26.3 pg (ref 26.0–34.0)
MCHC: 31.7 g/dL (ref 30.0–36.0)
MCV: 83 fL (ref 80.0–100.0)
Monocytes Absolute: 0.9 10*3/uL (ref 0.1–1.0)
Monocytes Relative: 8 %
Neutro Abs: 7.6 10*3/uL (ref 1.7–7.7)
Neutrophils Relative %: 68 %
Platelets: 389 10*3/uL (ref 150–400)
RBC: 3.53 MIL/uL — ABNORMAL LOW (ref 3.87–5.11)
RDW: 21.2 % — ABNORMAL HIGH (ref 11.5–15.5)
WBC: 11.1 10*3/uL — ABNORMAL HIGH (ref 4.0–10.5)
nRBC: 1.4 % — ABNORMAL HIGH (ref 0.0–0.2)

## 2021-07-25 LAB — I-STAT VENOUS BLOOD GAS, ED
Acid-Base Excess: 12 mmol/L — ABNORMAL HIGH (ref 0.0–2.0)
Bicarbonate: 32.9 mmol/L — ABNORMAL HIGH (ref 20.0–28.0)
Calcium, Ion: 1.22 mmol/L (ref 1.15–1.40)
HCT: 29 % — ABNORMAL LOW (ref 36.0–46.0)
Hemoglobin: 9.9 g/dL — ABNORMAL LOW (ref 12.0–15.0)
O2 Saturation: 89 %
Patient temperature: 98.9
Potassium: 3.7 mmol/L (ref 3.5–5.1)
Sodium: 131 mmol/L — ABNORMAL LOW (ref 135–145)
TCO2: 34 mmol/L — ABNORMAL HIGH (ref 22–32)
pCO2, Ven: 28.4 mmHg — ABNORMAL LOW (ref 44–60)
pH, Ven: 7.673 (ref 7.25–7.43)
pO2, Ven: 44 mmHg (ref 32–45)

## 2021-07-25 LAB — CBG MONITORING, ED: Glucose-Capillary: 247 mg/dL — ABNORMAL HIGH (ref 70–99)

## 2021-07-25 LAB — BETA-HYDROXYBUTYRIC ACID: Beta-Hydroxybutyric Acid: 1.39 mmol/L — ABNORMAL HIGH (ref 0.05–0.27)

## 2021-07-25 LAB — HCG, SERUM, QUALITATIVE: Preg, Serum: NEGATIVE

## 2021-07-25 MED ORDER — LACTATED RINGERS IV BOLUS
1000.0000 mL | Freq: Once | INTRAVENOUS | Status: AC
  Administered 2021-07-25: 1000 mL via INTRAVENOUS

## 2021-07-25 MED ORDER — ONDANSETRON HCL 4 MG/2ML IJ SOLN
4.0000 mg | Freq: Once | INTRAMUSCULAR | Status: AC
Start: 2021-07-25 — End: 2021-07-25
  Administered 2021-07-25: 4 mg via INTRAVENOUS
  Filled 2021-07-25: qty 2

## 2021-07-25 MED ORDER — HALOPERIDOL LACTATE 5 MG/ML IJ SOLN
5.0000 mg | Freq: Once | INTRAMUSCULAR | Status: AC
Start: 2021-07-25 — End: 2021-07-25
  Administered 2021-07-25: 5 mg via INTRAVENOUS
  Filled 2021-07-25: qty 1

## 2021-07-25 NOTE — ED Triage Notes (Signed)
Pt presents with ongoing N/V x2 days. Unable to keep her Zofran down

## 2021-07-25 NOTE — ED Provider Notes (Signed)
[facility] EMERGENCY DEPT Provider Note   CSN: [id number] Arrival date & time: 07/25/21  0809     History  Chief Complaint  Patient presents with   Nausea   Emesis    SAHILY BIDDLE is a 31 y.o. female.  Patient here with nausea and vomiting.  Patient states that she has had symptoms for the last 2 days.  She had dialysis yesterday and she thinks that they took off too much fluid.  She has no abdominal pain, chest pain, shortness of breath.  She has a history of type 1 diabetes.  Thinks she might be in DKA.  Nothing makes it worse or better.  Denies any diarrhea.  No weakness numbness, fever, chills.  She has been hyperventilating.  The history is provided by the patient.       Home Medications Prior to Admission medications   Medication Sig Start Date End Date Taking? Authorizing Provider  amLODipine (NORVASC) 10 MG tablet Take 1 tablet (10 mg total) by mouth daily. Patient taking differently: Take 10 mg by mouth every evening. 03/09/20   Mercy Riding, MD  calcitRIOL (ROCALTROL) 0.5 MCG capsule Take 0.5 mcg by mouth daily. 12/02/20   [provider]  carvedilol (COREG) 25 MG tablet Take 1 tablet (25 mg total) by mouth 2 (two) times daily. 03/07/19   Imogene Burn, PA-C  Continuous Blood Gluc Receiver DEVI 1 Units by Does not apply route 4 (four) times daily -  before meals and at bedtime. May substitute for cheapest monitor with insurance 03/27/21   Scarlett Presto, MD  Continuous Blood Gluc Sensor MISC 1 each by Does not apply route as directed. Use as directed every 14 days. May dispense FreeStyle [employer] or similar. 01/27/20   Antonieta Pert, MD  Continuous Glucose Monitor Sup MISC 1 Units by Does not apply route 4 (four) times daily -  before meals and at bedtime. May substitute for cheapest option with patient insurance 03/27/21 03/27/22  Scarlett Presto, MD  ferrous sulfate 325 (65 FE) MG tablet Take 325 mg by mouth daily. 05/12/21   [provider]  FLUoxetine (PROZAC) 40 MG capsule Take 40 mg by mouth every morning. 05/15/21   [provider]  glucose blood (FREESTYLE TEST STRIPS) test strip Use as instructed 02/25/21   Terrilee Croak, MD  hydrALAZINE (APRESOLINE) 50 MG tablet Take 1 tablet by mouth twice a day, may take 1 extra tablet by mouth daily only as needed for elevated blood pressure Patient taking differently: Take 50 mg by mouth See admin instructions. Take 50mg  by mouth twice a day, may take an additional 50mg  by mouth daily only as needed for elevated blood pressure. 04/28/21   Imogene Burn, PA-C  insulin glargine (LANTUS SOLOSTAR) 100 UNIT/ML Solostar Pen Inject 20 Units into the skin every morning. 05/11/21   Renato Shin, MD  insulin lispro (HUMALOG KWIKPEN) 100 UNIT/ML KwikPen 3 times a day (just before each meal) 4-3-3 units, and pen needles 4/day Patient taking differently: Inject 3-4 Units into the skin See admin instructions. 4 units with breakfast 3 units with lunch and dinner 04/21/21   Renato Shin, MD  metoCLOPramide (REGLAN) 5 MG tablet Take 1 tablet (5 mg total) by mouth 3 (three) times daily before meals. Patient not taking: Reported on 07/02/2021 02/23/21   Sherwood Gambler, MD  multivitamin (RENA-VIT) TABS tablet Take 1 tablet by mouth at bedtime. 03/08/20   Mercy Riding, MD  sevelamer carbonate (RENVELA)  800 MG tablet Take 1 tablet (800 mg total) by mouth 3 (three) times daily with meals. 02/05/21   Bonnielee Haff, MD      Allergies    Patient has no known allergies.    Review of Systems   Review of Systems  Physical Exam Updated Vital Signs BP (!) 186/114   Pulse 86   Temp 98.9 F (37.2 C) (Oral)   Resp 14   Ht 5\' 6"  (1.676 m)   Wt 64 kg   LMP  (LMP Unknown) Comment: pt denies pregnancy6.16.2023  SpO2 100%   BMI 22.76 kg/m  Physical Exam Vitals and nursing note reviewed.  Constitutional:      General: She is not in acute distress.    Appearance: She is well-developed. She  is not ill-appearing.  HENT:     Head: Normocephalic and atraumatic.     Nose: Nose normal.     Mouth/Throat:     Mouth: Mucous membranes are moist.  Eyes:     Extraocular Movements: Extraocular movements intact.     Conjunctiva/sclera: Conjunctivae normal.     Pupils: Pupils are equal, round, and reactive to light.  Cardiovascular:     Rate and Rhythm: Normal rate and regular rhythm.     Pulses: Normal pulses.     Heart sounds: No murmur heard. Pulmonary:     Effort: Pulmonary effort is normal. No respiratory distress.     Breath sounds: Normal breath sounds.  Abdominal:     Palpations: Abdomen is soft.     Tenderness: There is no abdominal tenderness.  Musculoskeletal:        General: No swelling.     Cervical back: Normal range of motion and neck supple.  Skin:    General: Skin is warm and dry.     Capillary Refill: Capillary refill takes less than 2 seconds.  Neurological:     General: No focal deficit present.     Mental Status: She is alert.  Psychiatric:        Mood and Affect: Mood normal.     ED Results / Procedures / Treatments   Labs (all labs ordered are listed, but only abnormal results are displayed) Labs Reviewed  CBC WITH DIFFERENTIAL/PLATELET - Abnormal; Notable for the following components:      Result Value   WBC 11.1 (*)    RBC 3.53 (*)    Hemoglobin 9.3 (*)    HCT 29.3 (*)    RDW 21.2 (*)    nRBC 1.4 (*)    Abs Immature Granulocytes 0.08 (*)    All other components within normal limits  BASIC METABOLIC PANEL - Abnormal; Notable for the following components:   Chloride 90 (*)    Glucose, Bld 211 (*)    Creatinine, Ser 4.70 (*)    Calcium 11.2 (*)    GFR, Estimated 12 (*)    Anion gap 17 (*)    All other components within normal limits  CBG MONITORING, ED - Abnormal; Notable for the following components:   Glucose-Capillary 247 (*)    All other components within normal limits  I-STAT VENOUS BLOOD GAS, ED - Abnormal; Notable for the  following components:   pH, Ven 7.673 (*)    pCO2, Ven 28.4 (*)    Bicarbonate 32.9 (*)    TCO2 34 (*)    Acid-Base Excess 12.0 (*)    Sodium 131 (*)    HCT 29.0 (*)    Hemoglobin 9.9 (*)  All other components within normal limits  HCG, SERUM, QUALITATIVE  BETA-HYDROXYBUTYRIC ACID    EKG EKG Interpretation  Date/Time:  Saturday July 25 2021 09:19:35 EDT Ventricular Rate:  113 PR Interval:  173 QRS Duration: 90 QT Interval:  355 QTC Calculation: 487 R Axis:   66 Text Interpretation: Sinus tachycardia Confirmed by Ronnald Nian, Isobelle Tuckett ([id number]) on 07/25/2021 9:46:05 AM  Radiology No results found.  Procedures Procedures    Medications Ordered in ED Medications  lactated ringers bolus 1,000 mL (1,000 mLs Intravenous New Bag/Given 07/25/21 0911)  ondansetron (ZOFRAN) injection 4 mg (4 mg Intravenous Given 07/25/21 0910)  haloperidol lactate (HALDOL) injection 5 mg (5 mg Intravenous Given 07/25/21 8887)    ED Course/ Medical Decision Making/ A&P                           Medical Decision Making Amount and/or Complexity of Data Reviewed Labs: ordered.  Risk Prescription drug management.   DONICA DEROUIN is here with nausea and vomiting.  History of end-stage renal disease, diabetes.  Differential diagnosis is DKA versus gastroparesis.  She has no abdominal pain.  No concern for intra-abdominal infection.  Dialysis yesterday.  She feels like they dialyze too much and she feels little bit dehydrated.  We will get CBC, CMP, ketones, blood gas.  Give fluid bolus, IV antiemetics including IV Haldol.  Per my review and interpretation of lab showing pH is 7.6.  Bicarb unremarkable.  Overall suspect this is from her hyperventilation.  Blood sugar is 211.  Bicarb 29.  She is not in DKA.  Overall per my review and interpretation of labs no significant findings.  She is feeling much better after antiemetics.  Suspect may be some element of gastroparesis.  Discharged in good condition.   Understands return precautions.  This chart was dictated using voice recognition software.  Despite best efforts to proofread,  errors can occur which can change the documentation meaning.         Final Clinical Impression(s) / ED Diagnoses Final diagnoses:  Nausea and vomiting, unspecified vomiting type    Rx / DC Orders ED Discharge Orders     None         Lennice Sites, DO 07/25/21 1150

## 2021-07-25 NOTE — ED Notes (Signed)
Pt unable to give a UA sample at this time. She does still make urine

## 2021-07-27 NOTE — Progress Notes (Signed)
Internal Medicine Clinic Attending  Case discussed with Dr. Nguyen  At the time of the visit.  We reviewed the resident's history and exam and pertinent patient test results.  I agree with the assessment, diagnosis, and plan of care documented in the resident's note. 

## 2021-07-28 ENCOUNTER — Encounter (HOSPITAL_COMMUNITY): Payer: Self-pay

## 2021-07-28 ENCOUNTER — Other Ambulatory Visit: Payer: Self-pay

## 2021-07-28 ENCOUNTER — Emergency Department (HOSPITAL_COMMUNITY)
Admission: EM | Admit: 2021-07-28 | Discharge: 2021-07-28 | Disposition: A | Discharge status: Home / Self Care | Payer: 59 | Attending: Emergency Medicine | Admitting: Emergency Medicine

## 2021-07-28 DIAGNOSIS — R112 Nausea with vomiting, unspecified: Secondary | ICD-10-CM | POA: Diagnosis present

## 2021-07-28 DIAGNOSIS — N186 End stage renal disease: Secondary | ICD-10-CM | POA: Insufficient documentation

## 2021-07-28 DIAGNOSIS — Z992 Dependence on renal dialysis: Secondary | ICD-10-CM | POA: Insufficient documentation

## 2021-07-28 DIAGNOSIS — N9489 Other specified conditions associated with female genital organs and menstrual cycle: Secondary | ICD-10-CM | POA: Insufficient documentation

## 2021-07-28 DIAGNOSIS — I120 Hypertensive chronic kidney disease with stage 5 chronic kidney disease or end stage renal disease: Secondary | ICD-10-CM | POA: Diagnosis not present

## 2021-07-28 DIAGNOSIS — Z79899 Other long term (current) drug therapy: Secondary | ICD-10-CM | POA: Diagnosis not present

## 2021-07-28 DIAGNOSIS — E1043 Type 1 diabetes mellitus with diabetic autonomic (poly)neuropathy: Secondary | ICD-10-CM | POA: Diagnosis not present

## 2021-07-28 DIAGNOSIS — Z794 Long term (current) use of insulin: Secondary | ICD-10-CM | POA: Diagnosis not present

## 2021-07-28 DIAGNOSIS — K3184 Gastroparesis: Secondary | ICD-10-CM

## 2021-07-28 LAB — COMPREHENSIVE METABOLIC PANEL
ALT: 8 U/L (ref 0–44)
AST: 18 U/L (ref 15–41)
Albumin: 3.2 g/dL — ABNORMAL LOW (ref 3.5–5.0)
Alkaline Phosphatase: 72 U/L (ref 38–126)
Anion gap: 13 (ref 5–15)
BUN: 28 mg/dL — ABNORMAL HIGH (ref 6–20)
CO2: 22 mmol/L (ref 22–32)
Calcium: 10.6 mg/dL — ABNORMAL HIGH (ref 8.9–10.3)
Chloride: 101 mmol/L (ref 98–111)
Creatinine, Ser: 6.34 mg/dL — ABNORMAL HIGH (ref 0.44–1.00)
GFR, Estimated: 8 mL/min — ABNORMAL LOW (ref >60)
Glucose, Bld: 224 mg/dL — ABNORMAL HIGH (ref 70–99)
Potassium: 3.2 mmol/L — ABNORMAL LOW (ref 3.5–5.1)
Sodium: 136 mmol/L (ref 135–145)
Total Bilirubin: 0.9 mg/dL (ref 0.3–1.2)
Total Protein: 7.9 g/dL (ref 6.5–8.1)

## 2021-07-28 LAB — CBC WITH DIFFERENTIAL/PLATELET
Abs Immature Granulocytes: 0.02 10*3/uL (ref 0.00–0.07)
Basophils Absolute: 0 10*3/uL (ref 0.0–0.1)
Basophils Relative: 0 %
Eosinophils Absolute: 0 10*3/uL (ref 0.0–0.5)
Eosinophils Relative: 0 %
HCT: 29.9 % — ABNORMAL LOW (ref 36.0–46.0)
Hemoglobin: 9.5 g/dL — ABNORMAL LOW (ref 12.0–15.0)
Immature Granulocytes: 0 %
Lymphocytes Relative: 13 %
Lymphs Abs: 1 10*3/uL (ref 0.7–4.0)
MCH: 26.8 pg (ref 26.0–34.0)
MCHC: 31.8 g/dL (ref 30.0–36.0)
MCV: 84.2 fL (ref 80.0–100.0)
Monocytes Absolute: 0.4 10*3/uL (ref 0.1–1.0)
Monocytes Relative: 5 %
Neutro Abs: 6.2 10*3/uL (ref 1.7–7.7)
Neutrophils Relative %: 82 %
Platelets: 347 10*3/uL (ref 150–400)
RBC: 3.55 MIL/uL — ABNORMAL LOW (ref 3.87–5.11)
RDW: 22.5 % — ABNORMAL HIGH (ref 11.5–15.5)
WBC: 7.6 10*3/uL (ref 4.0–10.5)
nRBC: 0 % (ref 0.0–0.2)

## 2021-07-28 LAB — I-STAT BETA HCG BLOOD, ED (MC, WL, AP ONLY): I-stat hCG, quantitative: <5 m[IU]/mL (ref <5)

## 2021-07-28 LAB — LIPASE, BLOOD: Lipase: 46 U/L (ref 11–51)

## 2021-07-28 MED ORDER — DROPERIDOL 2.5 MG/ML IJ SOLN
1.2500 mg | Freq: Once | INTRAMUSCULAR | Status: AC
  Administered 2021-07-28: 1.25 mg via INTRAVENOUS
  Filled 2021-07-28: qty 2

## 2021-07-28 MED ORDER — ONDANSETRON HCL 4 MG/2ML IJ SOLN
4.0000 mg | Freq: Once | INTRAMUSCULAR | Status: AC
  Administered 2021-07-28: 4 mg via INTRAVENOUS
  Filled 2021-07-28: qty 2

## 2021-07-28 MED ORDER — LACTATED RINGERS IV BOLUS
1000.0000 mL | Freq: Once | INTRAVENOUS | Status: AC
  Administered 2021-07-28: 1000 mL via INTRAVENOUS

## 2021-07-28 NOTE — ED Notes (Signed)
Pt care taken, no complaints at this time, said the medication helped a lot with her abdominal pain

## 2021-07-28 NOTE — ED Triage Notes (Signed)
EMS reports from home c/o Nausea and emesis since this morning. Hx of Gastroparesis and stage 5 renal failure with dialysis M/W/F.  BP 195/116 HR 112 RR 20 Sp02 98 RA CBG 241  22 RAC 4Mg  Zofran IM

## 2021-07-28 NOTE — Discharge Instructions (Addendum)
Your work-up in the ER today was reassuring for acute abnormalities. I suspect your symptoms are related to your gastroparesis. Please follow-up with your primary care provider in the next few days for further evaluation and management of your symptoms.   Return if development of any new or worsening symptoms.

## 2021-07-28 NOTE — ED Provider Notes (Signed)
[facility] DEPT Provider Note   CSN: [id number] Arrival date & time: 07/28/21  1608     History  Chief Complaint  Patient presents with   Abdominal Pain   Nausea   Emesis    Barbara Donovan is a 31 y.o. female.  Patient with history of T1DM, gastroparesis, ESRD on dialysis presents today with complaints of nausea and vomiting. She states that she was feeling well when she went to dialysis yesterday and completed a full course of same, however a few hours after she finished dialysis she began to have nausea and vomiting. She states that she has been vomiting constantly since then without any relief. States that this happened previously after dialysis on 7/08  and she presented for same and was given fluids and meds with improvement. States that she thinks they are taking too much fluid off at dialysis. Denies any hematemesis. Is having normal bowel movements. She denies fevers, chills, chest pain, shortness of breath, or abdominal pain. States that she does not feel like she is in DKA at this time.  The history is provided by the patient.  Abdominal Pain Associated symptoms: nausea and vomiting   Emesis Associated symptoms: no abdominal pain        Home Medications Prior to Admission medications   Medication Sig Start Date End Date Taking? Authorizing Provider  amLODipine (NORVASC) 10 MG tablet Take 1 tablet (10 mg total) by mouth daily. Patient taking differently: Take 10 mg by mouth every evening. 03/09/20  Yes Gonfa, Taye T, MD  calcitRIOL (ROCALTROL) 0.5 MCG capsule Take 0.5 mcg by mouth daily. 12/02/20  Yes [provider]  carvedilol (COREG) 25 MG tablet Take 1 tablet (25 mg total) by mouth 2 (two) times daily. 03/07/19  Yes Imogene Burn, PA-C  Continuous Blood Gluc Sensor MISC 1 each by Does not apply route as directed. Use as directed every 14 days. May dispense FreeStyle [employer] or similar. 01/27/20  Yes Antonieta Pert, MD   ferrous sulfate 325 (65 FE) MG tablet Take 325 mg by mouth daily. 05/12/21  Yes [provider]  FLUoxetine (PROZAC) 40 MG capsule Take 40 mg by mouth every morning. 05/15/21  Yes [provider]  hydrALAZINE (APRESOLINE) 50 MG tablet Take 1 tablet by mouth twice a day, may take 1 extra tablet by mouth daily only as needed for elevated blood pressure Patient taking differently: Take 50 mg by mouth 3 (three) times daily. 04/28/21  Yes Imogene Burn, PA-C  insulin glargine (LANTUS SOLOSTAR) 100 UNIT/ML Solostar Pen Inject 20 Units into the skin every morning. Patient taking differently: Inject 20 Units into the skin every evening. 05/11/21  Yes Renato Shin, MD  insulin lispro (HUMALOG KWIKPEN) 100 UNIT/ML KwikPen 3 times a day (just before each meal) 4-3-3 units, and pen needles 4/day Patient taking differently: Inject 3-4 Units into the skin See admin instructions. 4 units with breakfast 3 units with lunch and dinner 04/21/21  Yes Renato Shin, MD  metoCLOPramide (REGLAN) 5 MG tablet Take 1 tablet (5 mg total) by mouth 3 (three) times daily before meals. Patient taking differently: Take 5 mg by mouth daily as needed for nausea. 02/23/21  Yes Sherwood Gambler, MD  multivitamin (RENA-VIT) TABS tablet Take 1 tablet by mouth at bedtime. 03/08/20  Yes Mercy Riding, MD  sevelamer carbonate (RENVELA) 800 MG tablet Take 1 tablet (800 mg total) by mouth 3 (three) times daily with meals. 02/05/21  Yes [place] Pink,  Gokul, MD  Continuous Blood Gluc Receiver DEVI 1 Units by Does not apply route 4 (four) times daily -  before meals and at bedtime. May substitute for cheapest monitor with insurance 03/27/21   Scarlett Presto, MD  Continuous Glucose Monitor Sup MISC 1 Units by Does not apply route 4 (four) times daily -  before meals and at bedtime. May substitute for cheapest option with patient insurance 03/27/21 03/27/22  Scarlett Presto, MD  glucose blood (FREESTYLE TEST STRIPS) test strip Use as instructed  02/25/21   Terrilee Croak, MD      Allergies    Patient has no known allergies.    Review of Systems   Review of Systems  Gastrointestinal:  Positive for nausea and vomiting. Negative for abdominal pain.  All other systems reviewed and are negative.   Physical Exam Updated Vital Signs BP (!) 165/99   Pulse 98   Temp 97.8 F (36.6 C) (Oral)   Resp 18   LMP  (LMP Unknown) Comment: pt denies pregnancy6.16.2023  SpO2 99%  Physical Exam Vitals and nursing note reviewed.  Constitutional:      General: She is not in acute distress.    Appearance: Normal appearance. She is well-developed and normal weight. She is not ill-appearing, toxic-appearing or diaphoretic.  HENT:     Head: Normocephalic and atraumatic.  Cardiovascular:     Rate and Rhythm: Normal rate and regular rhythm.  Pulmonary:     Effort: Pulmonary effort is normal. No respiratory distress.     Breath sounds: Normal breath sounds.  Abdominal:     General: Abdomen is flat.     Palpations: Abdomen is soft.     Tenderness: There is no abdominal tenderness.  Musculoskeletal:        General: Normal range of motion.     Cervical back: Normal range of motion.  Skin:    General: Skin is warm and dry.  Neurological:     General: No focal deficit present.     Mental Status: She is alert.  Psychiatric:        Mood and Affect: Mood normal.        Behavior: Behavior normal.     ED Results / Procedures / Treatments   Labs (all labs ordered are listed, but only abnormal results are displayed) Labs Reviewed  COMPREHENSIVE METABOLIC PANEL - Abnormal; Notable for the following components:      Result Value   Potassium 3.2 (*)    Glucose, Bld 224 (*)    BUN 28 (*)    Creatinine, Ser 6.34 (*)    Calcium 10.6 (*)    Albumin 3.2 (*)    GFR, Estimated 8 (*)    All other components within normal limits  CBC WITH DIFFERENTIAL/PLATELET - Abnormal; Notable for the following components:   RBC 3.55 (*)    Hemoglobin 9.5 (*)     HCT 29.9 (*)    RDW 22.5 (*)    All other components within normal limits  LIPASE, BLOOD  URINALYSIS, ROUTINE W REFLEX MICROSCOPIC  I-STAT BETA HCG BLOOD, ED ([facility], [facility], [facility] ONLY)    EKG None  Radiology No results found.  Procedures Procedures    Medications Ordered in ED Medications  lactated ringers bolus 1,000 mL (1,000 mLs Intravenous New Bag/Given 07/28/21 1840)  ondansetron (ZOFRAN) injection 4 mg (4 mg Intravenous Given 07/28/21 1840)  droperidol (INAPSINE) 2.5 MG/ML injection 1.25 mg (1.25 mg Intravenous Given 07/28/21 1841)    ED Course/ Medical  Decision Making/ A&P                           Medical Decision Making Amount and/or Complexity of Data Reviewed Labs: ordered.  Risk Prescription drug management.   This patient presents to the ED for concern of nausea and vomiting, this involves an extensive number of treatment options, and is a complaint that carries with it a high risk of complications and morbidity. Ddx DKA vs gastroparesis   Co morbidities that complicate the patient evaluation  Hx T1DM, ESRD on hemodialysis, htn   Additional history obtained:  Additional history obtained from epic chart review   Lab Tests:  I Ordered, and personally interpreted labs.  The pertinent results include:  K 3.2, glucose 224, bicarb and anion gap normal. Other laboratory findings unchanged from previous   Problem List / ED Course / Critical interventions / Medication management  I ordered medication including zofran, droperidol, and fluids  for nausea and vomiting, and dehydration  Reevaluation of the patient after these medicines showed that the patient resolved I have reviewed the patients home medicines and have made adjustments as needed   Test / Admission - Considered:  Patient presents today with nausea and vomiting x 12 hours.  She is afebrile, non-toxic appearing, and in no acute distress with reassuring vital signs. She has no abdominal pain.  No  concern for intra-abdominal infection.  Dialysis yesterday.  She feels like they dialyze too much and she feels little bit dehydrated. Given fluid bolus, IV antiemetics including IV droperidol with resolution of symptoms. Vitals and labs overall unremarkable for DKA.  Overall per my review and interpretation of labs no significant findings. Suspect symptoms related to gastroparesis. No further emergent concerns. Offered prescription for home anti-emetics, however patient states that she has these already. She is stable for discharge. Educated on red flag symptoms that would prompt immediate return. Patient is understanding and amenable with plan, discharged in stable condition.   Final Clinical Impression(s) / ED Diagnoses Final diagnoses:  Nausea and vomiting, unspecified vomiting type  Gastroparesis    Rx / DC Orders ED Discharge Orders     None     An After Visit Summary was printed and given to the patient.     Nestor Lewandowsky 07/31/21 1425    Varney Biles, MD 08/04/21 1646

## 2021-07-29 ENCOUNTER — Telehealth: Payer: Self-pay

## 2021-07-29 NOTE — Telephone Encounter (Signed)
Attempted to contact the patient in regards to rescheduling with another provider, LVM for a call back. 

## 2021-07-31 ENCOUNTER — Encounter (HOSPITAL_BASED_OUTPATIENT_CLINIC_OR_DEPARTMENT_OTHER): Payer: Self-pay | Admitting: Emergency Medicine

## 2021-07-31 ENCOUNTER — Emergency Department (HOSPITAL_BASED_OUTPATIENT_CLINIC_OR_DEPARTMENT_OTHER)
Admission: EM | Admit: 2021-07-31 | Discharge: 2021-07-31 | Disposition: A | Discharge status: Home / Self Care | Payer: 59 | Attending: Emergency Medicine | Admitting: Emergency Medicine

## 2021-07-31 ENCOUNTER — Other Ambulatory Visit: Payer: Self-pay

## 2021-07-31 DIAGNOSIS — R1012 Left upper quadrant pain: Secondary | ICD-10-CM | POA: Insufficient documentation

## 2021-07-31 DIAGNOSIS — Z992 Dependence on renal dialysis: Secondary | ICD-10-CM | POA: Diagnosis not present

## 2021-07-31 DIAGNOSIS — E1122 Type 2 diabetes mellitus with diabetic chronic kidney disease: Secondary | ICD-10-CM | POA: Insufficient documentation

## 2021-07-31 DIAGNOSIS — R112 Nausea with vomiting, unspecified: Secondary | ICD-10-CM

## 2021-07-31 DIAGNOSIS — Z79899 Other long term (current) drug therapy: Secondary | ICD-10-CM | POA: Diagnosis not present

## 2021-07-31 DIAGNOSIS — Z794 Long term (current) use of insulin: Secondary | ICD-10-CM | POA: Insufficient documentation

## 2021-07-31 DIAGNOSIS — N186 End stage renal disease: Secondary | ICD-10-CM | POA: Diagnosis not present

## 2021-07-31 DIAGNOSIS — E871 Hypo-osmolality and hyponatremia: Secondary | ICD-10-CM

## 2021-07-31 DIAGNOSIS — I10 Essential (primary) hypertension: Secondary | ICD-10-CM

## 2021-07-31 DIAGNOSIS — D631 Anemia in chronic kidney disease: Secondary | ICD-10-CM | POA: Insufficient documentation

## 2021-07-31 DIAGNOSIS — E1165 Type 2 diabetes mellitus with hyperglycemia: Secondary | ICD-10-CM | POA: Diagnosis not present

## 2021-07-31 DIAGNOSIS — I120 Hypertensive chronic kidney disease with stage 5 chronic kidney disease or end stage renal disease: Secondary | ICD-10-CM | POA: Insufficient documentation

## 2021-07-31 LAB — I-STAT ARTERIAL BLOOD GAS, ED
Acid-Base Excess: 4 mmol/L — ABNORMAL HIGH (ref 0.0–2.0)
Bicarbonate: 28 mmol/L (ref 20.0–28.0)
Calcium, Ion: 1.24 mmol/L (ref 1.15–1.40)
HCT: 28 % — ABNORMAL LOW (ref 36.0–46.0)
Hemoglobin: 9.5 g/dL — ABNORMAL LOW (ref 12.0–15.0)
O2 Saturation: 97 %
Patient temperature: 97.9
Potassium: 2.7 mmol/L — CL (ref 3.5–5.1)
Sodium: 131 mmol/L — ABNORMAL LOW (ref 135–145)
TCO2: 29 mmol/L (ref 22–32)
pCO2 arterial: 39.5 mmHg (ref 32–48)
pH, Arterial: 7.456 — ABNORMAL HIGH (ref 7.35–7.45)
pO2, Arterial: 84 mmHg (ref 83–108)

## 2021-07-31 LAB — COMPREHENSIVE METABOLIC PANEL
ALT: 7 U/L (ref 0–44)
AST: 28 U/L (ref 15–41)
Albumin: 3.8 g/dL (ref 3.5–5.0)
Alkaline Phosphatase: 64 U/L (ref 38–126)
Anion gap: 14 (ref 5–15)
BUN: 17 mg/dL (ref 6–20)
CO2: 24 mmol/L (ref 22–32)
Calcium: 10.1 mg/dL (ref 8.9–10.3)
Chloride: 89 mmol/L — ABNORMAL LOW (ref 98–111)
Creatinine, Ser: 6.6 mg/dL — ABNORMAL HIGH (ref 0.44–1.00)
GFR, Estimated: 8 mL/min — ABNORMAL LOW (ref >60)
Glucose, Bld: 165 mg/dL — ABNORMAL HIGH (ref 70–99)
Potassium: 3.5 mmol/L (ref 3.5–5.1)
Sodium: 127 mmol/L — ABNORMAL LOW (ref 135–145)
Total Bilirubin: 0.7 mg/dL (ref 0.3–1.2)
Total Protein: 8.3 g/dL — ABNORMAL HIGH (ref 6.5–8.1)

## 2021-07-31 LAB — URINALYSIS, ROUTINE W REFLEX MICROSCOPIC
Bilirubin Urine: NEGATIVE
Glucose, UA: >1000 mg/dL — AB
Ketones, ur: NEGATIVE mg/dL
Leukocytes,Ua: NEGATIVE
Nitrite: NEGATIVE
Protein, ur: >300 mg/dL — AB
Specific Gravity, Urine: 1.006 (ref 1.005–1.030)
pH: 8.5 — ABNORMAL HIGH (ref 5.0–8.0)

## 2021-07-31 LAB — CBG MONITORING, ED: Glucose-Capillary: 177 mg/dL — ABNORMAL HIGH (ref 70–99)

## 2021-07-31 LAB — CBC
HCT: 27.9 % — ABNORMAL LOW (ref 36.0–46.0)
Hemoglobin: 9.1 g/dL — ABNORMAL LOW (ref 12.0–15.0)
MCH: 26.7 pg (ref 26.0–34.0)
MCHC: 32.6 g/dL (ref 30.0–36.0)
MCV: 81.8 fL (ref 80.0–100.0)
Platelets: 266 10*3/uL (ref 150–400)
RBC: 3.41 MIL/uL — ABNORMAL LOW (ref 3.87–5.11)
RDW: 21.4 % — ABNORMAL HIGH (ref 11.5–15.5)
WBC: 6.7 10*3/uL (ref 4.0–10.5)
nRBC: 0 % (ref 0.0–0.2)

## 2021-07-31 LAB — PREGNANCY, URINE: Preg Test, Ur: NEGATIVE

## 2021-07-31 LAB — LIPASE, BLOOD: Lipase: 118 U/L — ABNORMAL HIGH (ref 11–51)

## 2021-07-31 MED ORDER — LACTATED RINGERS IV BOLUS
1000.0000 mL | Freq: Once | INTRAVENOUS | Status: AC
  Administered 2021-07-31: 1000 mL via INTRAVENOUS

## 2021-07-31 MED ORDER — HALOPERIDOL LACTATE 5 MG/ML IJ SOLN
5.0000 mg | Freq: Once | INTRAMUSCULAR | Status: AC
  Administered 2021-07-31: 5 mg via INTRAVENOUS
  Filled 2021-07-31: qty 1

## 2021-07-31 MED ORDER — ONDANSETRON 8 MG PO TBDP: 8.0000 mg | ORAL_TABLET | Freq: Three times a day (TID) | ORAL | 0 refills | Status: DC | PRN

## 2021-07-31 MED ORDER — ONDANSETRON HCL 4 MG/2ML IJ SOLN
4.0000 mg | Freq: Once | INTRAMUSCULAR | Status: AC
Start: 2021-07-31 — End: 2021-07-31
  Administered 2021-07-31: 4 mg via INTRAVENOUS
  Filled 2021-07-31: qty 2

## 2021-07-31 NOTE — ED Notes (Signed)
Pt verbalizes understanding of discharge instructions. Opportunity for questioning and answers were provided. Pt discharged from ED to home with family.    

## 2021-07-31 NOTE — ED Notes (Signed)
RT called to pt room for desaturations into the upper 70's low 80's. Pt currently on RA w/apneic episodes during the desaturations. RT assessed pt at this time, Bilat BS clear uppers/clear diminished lowers. Pt falls asleep (very deep sleep) and becomes apneic then desaturates. RT monitored pt briefly and then notified MD who then ordered and ABG. RT obtained ABG with the following results and critical values. MD notified of ABG critical value on K 2.7 and placed pt on Tabiona 4 Lpm. RT will continue to monitor.    Latest Reference Range & Units 07/31/21 04:01  Sample type  ARTERIAL  pH, Arterial 7.35 - 7.45  7.456 (H)  pCO2 arterial 32 - 48 mmHg 39.5  pO2, Arterial 83 - 108 mmHg 84  TCO2 22 - 32 mmol/L 29  Acid-Base Excess 0.0 - 2.0 mmol/L 4.0 (H)  Bicarbonate 20.0 - 28.0 mmol/L 28.0  O2 Saturation % 97  Patient temperature  97.9 F  Collection site  RADIAL, ALLEN'S TEST ACCEPTABLE

## 2021-07-31 NOTE — ED Provider Notes (Signed)
[facility] EMERGENC[facility] DEPT Provider Note   CSN: [id number] Arrival date & time: 07/31/21  0036     History  Chief Complaint  Patient presents with   Nausea   Emesis    Barbara Donovan is a 31 y.o. female.  The history is provided by the patient.  Emesis She has history of hypertension, diabetes, hyperlipidemia, end-stage renal disease on hemodialysis, intractable nausea vomiting with possible gastroparesis and comes in because of recurrent vomiting.  She had been seen in the emergency department 3 days ago and was doing well on discharge.  However, following dialysis yesterday, she had recurrence of vomiting.  She is also complaining of some pain in her left upper abdomen.  She denies fever, chills, sweats.  She denies constipation or diarrhea.   Home Medications Prior to Admission medications   Medication Sig Start Date End Date Taking? Authorizing Provider  amLODipine (NORVASC) 10 MG tablet Take 1 tablet (10 mg total) by mouth daily. Patient taking differently: Take 10 mg by mouth every evening. 03/09/20   Mercy Riding, MD  calcitRIOL (ROCALTROL) 0.5 MCG capsule Take 0.5 mcg by mouth daily. 12/02/20   [provider]  carvedilol (COREG) 25 MG tablet Take 1 tablet (25 mg total) by mouth 2 (two) times daily. 03/07/19   Imogene Burn, PA-C  Continuous Blood Gluc Receiver DEVI 1 Units by Does not apply route 4 (four) times daily -  before meals and at bedtime. May substitute for cheapest monitor with insurance 03/27/21   Scarlett Presto, MD  Continuous Blood Gluc Sensor MISC 1 each by Does not apply route as directed. Use as directed every 14 days. May dispense FreeStyle [employer] or similar. 01/27/20   Antonieta Pert, MD  Continuous Glucose Monitor Sup MISC 1 Units by Does not apply route 4 (four) times daily -  before meals and at bedtime. May substitute for cheapest option with patient insurance 03/27/21 03/27/22  Scarlett Presto, MD  ferrous sulfate 325 (65 FE)  MG tablet Take 325 mg by mouth daily. 05/12/21   [provider]  FLUoxetine (PROZAC) 40 MG capsule Take 40 mg by mouth every morning. 05/15/21   [provider]  glucose blood (FREESTYLE TEST STRIPS) test strip Use as instructed 02/25/21   Terrilee Croak, MD  hydrALAZINE (APRESOLINE) 50 MG tablet Take 1 tablet by mouth twice a day, may take 1 extra tablet by mouth daily only as needed for elevated blood pressure Patient taking differently: Take 50 mg by mouth 3 (three) times daily. 04/28/21   Imogene Burn, PA-C  insulin glargine (LANTUS SOLOSTAR) 100 UNIT/ML Solostar Pen Inject 20 Units into the skin every morning. Patient taking differently: Inject 20 Units into the skin every evening. 05/11/21   Renato Shin, MD  insulin lispro (HUMALOG KWIKPEN) 100 UNIT/ML KwikPen 3 times a day (just before each meal) 4-3-3 units, and pen needles 4/day Patient taking differently: Inject 3-4 Units into the skin See admin instructions. 4 units with breakfast 3 units with lunch and dinner 04/21/21   Renato Shin, MD  metoCLOPramide (REGLAN) 5 MG tablet Take 1 tablet (5 mg total) by mouth 3 (three) times daily before meals. Patient taking differently: Take 5 mg by mouth daily as needed for nausea. 02/23/21   Sherwood Gambler, MD  multivitamin (RENA-VIT) TABS tablet Take 1 tablet by mouth at bedtime. 03/08/20   Mercy Riding, MD  sevelamer carbonate (RENVELA) 800 MG tablet Take 1 tablet (800 mg total) by mouth  3 (three) times daily with meals. 02/05/21   Bonnielee Haff, MD      Allergies    Patient has no known allergies.    Review of Systems   Review of Systems  Gastrointestinal:  Positive for vomiting.  All other systems reviewed and are negative.   Physical Exam Updated Vital Signs BP (!) 191/106   Pulse 93   Temp 97.9 F (36.6 C)   Resp 13   LMP  (LMP Unknown) Comment: pt denies pregnancy6.16.2023  SpO2 100%  Physical Exam Vitals and nursing note reviewed.   31 year old female,  resting comfortably and in no acute distress. Vital signs are significant for elevated blood pressure. Oxygen saturation is 100%, which is normal. Head is normocephalic and atraumatic. PERRLA, EOMI. Oropharynx is clear. Neck is nontender and supple without adenopathy or JVD. Back is nontender and there is no CVA tenderness. Lungs are clear without rales, wheezes, or rhonchi. Chest is nontender.  Dialysis access catheters present in the left subclavian area. Heart has regular rate and rhythm without murmur. Abdomen is soft, flat, with mild left upper quadrant tenderness.  There is no rebound or guarding.  Peristalsis is hypoactive. Extremities have no cyanosis or edema, full range of motion is present. Skin is warm and dry without rash. Neurologic: Mental status is normal, cranial nerves are intact, moves all extremities equally.  ED Results / Procedures / Treatments   Labs (all labs ordered are listed, but only abnormal results are displayed) Labs Reviewed  LIPASE, BLOOD - Abnormal; Notable for the following components:      Result Value   Lipase 118 (*)    All other components within normal limits  COMPREHENSIVE METABOLIC PANEL - Abnormal; Notable for the following components:   Sodium 127 (*)    Chloride 89 (*)    Glucose, Bld 165 (*)    Creatinine, Ser 6.60 (*)    Total Protein 8.3 (*)    GFR, Estimated 8 (*)    All other components within normal limits  URINALYSIS, ROUTINE W REFLEX MICROSCOPIC - Abnormal; Notable for the following components:   pH 8.5 (*)    Glucose, UA >1,000 (*)    Hgb urine dipstick TRACE (*)    Protein, ur >300 (*)    All other components within normal limits  CBC - Abnormal; Notable for the following components:   RBC 3.41 (*)    Hemoglobin 9.1 (*)    HCT 27.9 (*)    RDW 21.4 (*)    All other components within normal limits  CBG MONITORING, ED - Abnormal; Notable for the following components:   Glucose-Capillary 177 (*)    All other components  within normal limits  I-STAT ARTERIAL BLOOD GAS, ED - Abnormal; Notable for the following components:   pH, Arterial 7.456 (*)    Acid-Base Excess 4.0 (*)    Sodium 131 (*)    Potassium 2.7 (*)    HCT 28.0 (*)    Hemoglobin 9.5 (*)    All other components within normal limits  PREGNANCY, URINE   Procedures Procedures  Cardiac monitor shows normal sinus rhythm, per my interpretation.  Medications Ordered in ED Medications  lactated ringers bolus 1,000 mL (0 mLs Intravenous Stopped 07/31/21 0430)  ondansetron (ZOFRAN) injection 4 mg (4 mg Intravenous Given 07/31/21 0331)  haloperidol lactate (HALDOL) injection 5 mg (5 mg Intravenous Given 07/31/21 0737)    ED Course/ Medical Decision Making/ A&P  Medical Decision Making Amount and/or Complexity of Data Reviewed Labs: ordered.  Risk Prescription drug management.   Nausea, vomiting, left upper quadrant pain and patient with history of similar complaints, possible gastroparesis.  Doubt bowel obstruction.  Old records are reviewed, and it is noted that she had been admitted on 05/28/2021 for intractable vomiting, has also had episodes of ketoacidosis requiring hospital admission - most recently on 07/02/2021.  Today, capillary blood glucose is 403, metabolic panel pending.  I have ordered IV fluids, ondansetron, haloperidol.  She had good relief of nausea following the above-noted treatment.  She was resting quietly, but did have episodes of oxygen desaturation.  Blood pressure was also noted to be significantly elevated.  She was placed on supplemental oxygen.  I have ordered an ABG - my interpretation is normal ABG, no evidence of CO2 retention.  She is observed in the emergency department and then taken off of nasal oxygen with and did not have any further oxygen desaturation was felt to be safe for discharge.  She clearly will need to follow-up for her scheduled dialysis.  She is also advised to monitor her blood  pressure at home as she will definitely need to have better blood pressure control on an ongoing basis.  Return precautions discussed.  Final Clinical Impression(s) / ED Diagnoses Final diagnoses:  Nausea and vomiting, unspecified vomiting type  Elevated blood pressure reading with diagnosis of hypertension  End-stage renal disease on hemodialysis ([place])  Hyponatremia  Anemia associated with chronic renal failure    Rx / DC Orders ED Discharge Orders          Ordered    ondansetron (ZOFRAN-ODT) 8 MG disintegrating tablet  Every 8 hours PRN        07/31/21 4742              Delora Fuel, MD 59/56/38 [phone]

## 2021-07-31 NOTE — ED Notes (Signed)
Several attempts to collect CBC had been made by clinical staff. Recollected CBC at this time and sent to lab.

## 2021-07-31 NOTE — ED Notes (Signed)
Pt's O2 saturation continuing to drop due to pt's decreased respiratory drive. Pt states that it hurts to breath. When encouraged to breath pt's O2 saturation WNL. RT called to bedside for further evaluation

## 2021-07-31 NOTE — ED Notes (Signed)
EDP Roxanne Mins at bedside assessing pt d/t RT concerns for intermittent decrease in RR and SpO2. EDP placed orders for ABG

## 2021-07-31 NOTE — ED Notes (Signed)
Labs need to be recollected.

## 2021-07-31 NOTE — ED Triage Notes (Signed)
Nausea vomiting since yesterday after dialysis. Not able to keep anything down including zofran and reglan. Dm2 did not check sugars today

## 2021-07-31 NOTE — ED Notes (Signed)
Oxygen adjusted weaned down to two liters at this time

## 2021-07-31 NOTE — Discharge Instructions (Signed)
Make sure to take your blood pressure medication as prescribed.  Make sure to go for your scheduled dialysis session.  Please monitor your blood pressure at home.  Inadequately controlled blood pressure can lead to heart attacks and strokes.  Return if your symptoms or not being adequately controlled at home.

## 2021-07-31 NOTE — ED Notes (Signed)
Labs clotted. Primary RN Jinny Blossom informed recollect needed

## 2021-08-03 NOTE — Progress Notes (Unsigned)
[facility] PROGRESS NOTE  ASSESSMENT / PLAN: Barbara Donovan is a 31 y.o. female with ESRD in need of permanent dialysis access. She is right handed. She is currently dialyzing via left jugular tunneled dialysis catheter. Plan left arm arteriovenous graft as her dialysis schedule allows.   SUBJECTIVE: No complaints. No further fevers, chills, rigors.   OBJECTIVE: BP (!) 196/112 (BP Location: Left Arm, Patient Position: Sitting, Cuff Size: Normal)   Pulse (!) 101   Temp 99.3 F (37.4 C)   Resp 20   Ht 5\' 6"  (1.676 m)   Wt 142 lb (64.4 kg)   SpO2 100%   BMI 22.92 kg/m   Chronically ill appearing young woman in no distress Regular rate and rhythm 2+ brachial pulses     Latest Ref Rng & Units 07/31/2021    4:40 AM 07/31/2021    4:01 AM 07/28/2021    5:03 PM  CBC  WBC 4.0 - 10.5 K/uL 6.7   7.6   Hemoglobin 12.0 - 15.0 g/dL 9.1  9.5  9.5   Hematocrit 36.0 - 46.0 % 27.9  28.0  29.9   Platelets 150 - 400 K/uL 266   347         Latest Ref Rng & Units 07/31/2021    4:01 AM 07/31/2021    2:38 AM 07/28/2021    5:03 PM  CMP  Glucose 70 - 99 mg/dL  165  224   BUN 6 - 20 mg/dL  17  28   Creatinine 0.44 - 1.00 mg/dL  6.60  6.34   Sodium 135 - 145 mmol/L 131  127  136   Potassium 3.5 - 5.1 mmol/L 2.7  3.5  3.2   Chloride 98 - 111 mmol/L  89  101   CO2 22 - 32 mmol/L  24  22   Calcium 8.9 - 10.3 mg/dL  10.1  10.6   Total Protein 6.5 - 8.1 g/dL  8.3  7.9   Total Bilirubin 0.3 - 1.2 mg/dL  0.7  0.9   Alkaline Phos 38 - 126 U/L  64  72   AST 15 - 41 U/L  28  18   ALT 0 - 44 U/L  7  8     Estimated Creatinine Clearance: 11.7 mL/min (A) (by C-G formula based on SCr of 6.6 mg/dL (H)).  Vein mapping shows no usable autogenous vein for dialysis access creation  [employer]. Stanford Breed, MD [facility] Phone Number: [phone] 08/03/2021 2:14 PM

## 2021-08-04 ENCOUNTER — Other Ambulatory Visit: Payer: Self-pay

## 2021-08-04 ENCOUNTER — Ambulatory Visit (INDEPENDENT_AMBULATORY_CARE_PROVIDER_SITE_OTHER): Payer: 59 | Admitting: Vascular Surgery

## 2021-08-04 ENCOUNTER — Encounter: Payer: Self-pay | Admitting: Vascular Surgery

## 2021-08-04 VITALS — BP 196/112 | HR 101 | Temp 99.3°F | Resp 20 | Ht 66.0 in | Wt 142.0 lb

## 2021-08-04 DIAGNOSIS — N186 End stage renal disease: Secondary | ICD-10-CM

## 2021-08-04 DIAGNOSIS — Z992 Dependence on renal dialysis: Secondary | ICD-10-CM

## 2021-08-10 ENCOUNTER — Other Ambulatory Visit: Payer: Self-pay

## 2021-08-10 ENCOUNTER — Encounter (HOSPITAL_COMMUNITY): Payer: Self-pay | Admitting: Vascular Surgery

## 2021-08-10 NOTE — Anesthesia Preprocedure Evaluation (Addendum)
Anesthesia Evaluation  Patient identified by MRN, date of birth, ID band Patient awake    Reviewed: Allergy & Precautions, NPO status , Patient's Chart, lab work & pertinent test results  Airway Mallampati: II  TM Distance: >3 FB Neck ROM: Full    Dental   Pulmonary asthma ,    breath sounds clear to auscultation       Cardiovascular hypertension, Pt. on medications and Pt. on home beta blockers  Rhythm:Regular Rate:Normal     Neuro/Psych negative neurological ROS     GI/Hepatic negative GI ROS, Neg liver ROS,   Endo/Other  diabetes, Type 1, Insulin Dependent  Renal/GU ESRF and DialysisRenal disease     Musculoskeletal   Abdominal   Peds  Hematology  (+) Blood dyscrasia, anemia ,   Anesthesia Other Findings   Reproductive/Obstetrics                            Anesthesia Physical Anesthesia Plan  ASA: 3  Anesthesia Plan: Regional and MAC   Post-op Pain Management: Regional block* and Tylenol PO (pre-op)*   Induction:   PONV Risk Score and Plan: 2 and Propofol infusion and Ondansetron  Airway Management Planned: Natural Airway and Simple Face Mask  Additional Equipment:   Intra-op Plan:   Post-operative Plan:   Informed Consent: I have reviewed the patients History and Physical, chart, labs and discussed the procedure including the risks, benefits and alternatives for the proposed anesthesia with the patient or authorized representative who has indicated his/her understanding and acceptance.       Plan Discussed with: CRNA  Anesthesia Plan Comments: (PAT note written 08/10/2021 by Myra Gianotti, PA-C. On hemodialysis. She is followed by cardiologist Dr. Radford Pax for HTN. On amlodipine 10 mg Q PM, Coreg 25 mg BID, hydralazine 50 mg BID and can take one extra dose as needed for elevated BP. )      Anesthesia Quick Evaluation

## 2021-08-10 NOTE — Progress Notes (Signed)
PCP - Scarlett Presto, MD Cardiologist - Fransico Him, MD  PPM/ICD - denies Device Orders - n/a Rep Notified - n/a  Chest x-ray - 07/02/2021 EKG - 07/28/2021 Stress Test - 03/18/2021 ECHO - 07/08/2021 Cardiac Cath - denies  CPAP - n/a  Fasting Blood Sugar - unable to ask the patient  Blood Thinner Instructions: n/a Aspirin Instructions: Patient was instructed:   ERAS Protcol - n/a  COVID TEST- n/a  Anesthesia review: yes  Patient verbally denies any shortness of breath, fever, cough and chest pain during phone call   -------------  SDW INSTRUCTIONS given:  Your procedure is scheduled on Tuesday, July 25th, 2023.   Report to [facility] Main Entrance "A" at 0700 A.M., and check in at the Admitting office.  Call this number if you have problems the morning of surgery:  [phone]   Remember:  Do not eat or drink after midnight the night before your surgery    Take these medicines the morning of surgery with A SIP OF WATER Coreg, Prozac, Hydralazine; PRN: Zofran, Reglan.   WHAT DO I DO ABOUT MY DIABETES MEDICATION?  THE NIGHT BEFORE SURGERY, take 16 units of insulin glargine (LANTUS SOLOSTAR) - 80% of your regular dose      THE MORNING OF SURGERY do not take insulin lispro (HUMALOG KWIKPEN)  If your CBG is greater than 220 mg/dL, you may take  of your sliding scale (correction) dose of insulin.   How do I manage my blood sugar before surgery? Check your blood sugar at least 4 times a day, starting 2 days before surgery, to make sure that the level is not too high or low.  Check your blood sugar the morning of your surgery when you wake up and every 2 hours until you get to the Short Stay unit.  If your blood sugar is less than 70 mg/dL, you will need to treat for low blood sugar: Do not take insulin. Treat a low blood sugar (less than 70 mg/dL) with  cup of clear juice (cranberry or apple), 4 glucose tablets, OR glucose gel. Recheck blood sugar in 15 minutes  after treatment (to make sure it is greater than 70 mg/dL). If your blood sugar is not greater than 70 mg/dL on recheck, call [phone] for further instructions. Report your blood sugar to the short stay nurse when you get to Short Stay.   The day of surgery:                     Do not wear jewelry, make up, or nail polish            Do not wear lotions, powders, perfumes, or deodorant.            Do not shave 48 hours prior to surgery.              Do not bring valuables to the hospital.            [facility] is not responsible for any belongings or valuables.  Do NOT Smoke (Tobacco/Vaping) 24 hours prior to your procedure If you use a CPAP at night, you may bring all equipment for your overnight stay.   Contacts, glasses, dentures or bridgework may not be worn into surgery.      For patients admitted to the hospital, discharge time will be determined by your treatment team.   Patients discharged the day of surgery will not be allowed to drive home, and someone  needs to stay with them for 24 hours.    Special instructions:   [place]- Preparing For Surgery  Before surgery, you can play an important role. Because skin is not sterile, your skin needs to be as free of germs as possible. You can reduce the number of germs on your skin by washing with CHG (chlorahexidine gluconate) Soap before surgery.  CHG is an antiseptic cleaner which kills germs and bonds with the skin to continue killing germs even after washing.    Oral Hygiene is also important to reduce your risk of infection.  Remember - BRUSH YOUR TEETH THE MORNING OF SURGERY WITH YOUR REGULAR TOOTHPASTE  Please do not use if you have an allergy to CHG or antibacterial soaps. If your skin becomes reddened/irritated stop using the CHG.  Do not shave (including legs and underarms) for at least 48 hours prior to first CHG shower. It is OK to shave your face.  Please follow these instructions carefully.   Shower the NIGHT  BEFORE SURGERY and the MORNING OF SURGERY with DIAL Soap.   Pat yourself dry with a CLEAN TOWEL.  Wear CLEAN PAJAMAS to bed the night before surgery  Place CLEAN SHEETS on your bed the night of your first shower and DO NOT SLEEP WITH PETS.   Day of Surgery: Please shower morning of surgery  Wear Clean/Comfortable clothing the morning of surgery Do not apply any deodorants/lotions.   Remember to brush your teeth WITH YOUR REGULAR TOOTHPASTE.   Questions were answered. Patient verbalized understanding of instructions.

## 2021-08-10 NOTE — Progress Notes (Addendum)
Anesthesia Chart Review: SAME DAY WORK-UP  Case: [id number] Date/Time: 08/11/21 0918   Procedure: INSERTION OF LEFT ARM ARTERIOVENOUS (AV) GORE-TEX GRAFT (Left) - PERIPHERAL NERVE BLOCK   Anesthesia type: Monitor Anesthesia Care   Pre-op diagnosis: ESRD   Location: [facility] OR ROOM 11 / [place] OR   Surgeons: Broadus John, MD       DISCUSSION: Patient is a 31 year old female scheduled for the above procedure. She transitioned from peritoneal dialysis to hemodialysis  ~ May 2023. S/p removal of right IJ tunneled dialysis catheter [facility]) and placement of left IJ [facility] 07/09/21 for MSSA bacteremia. She needs permanent HD access.   History includes never smoker, DM1 (with retinopathy, nephropathy), ESRD (HD 06/03/20->PD catheter 09/10/20->transitioned from PD to HD ~ 05/2021), HTN, chronic N/V (per GI 10/09/20, [name], CT, EGD, gastric emptying study "unrevealing" but treated as diabetic gastroparesis with Reglan), childhood asthma, c-section 12/27/17 (infant born with VSD, requiring NICU stay), MSSA bacteremia (06/2021).   She is followed by cardiologist Dr. Radford Pax for HTN. On amlodipine 10 mg Q PM, Coreg 25 mg BID, hydralazine 50 mg BID and can take one extra dose as needed for elevated BP. Total plasma metanephrines 130 (reference range < 205) and total catecholamines 497 (reference supine 123-671, upright [phone]) on 12/21/19. No renal artery stenosis by 03/22/19 [name]. BP 196/112 at 08/04/21 visit with Dr. Roselie Awkward. BP 138/98 (sitting) and 112/89 (standing) on 08/07/21 at HD center.   She is a same-day work-up, so anesthesia team to evaluate on the day of surgery.   VS:  BP Readings from Last 3 Encounters:  08/04/21 (!) 196/112  07/31/21 (!) 176/115  07/28/21 (!) 155/97   Pulse Readings from Last 3 Encounters:  08/04/21 (!) 101  07/31/21 83  07/28/21 96     PROVIDERS: Scarlett Presto, MD is PCP - Santiago Bumpers, MD is nephrologist. She is being considered for renal transplant through [facility].  Renato Shin, MD is  endocrinologist. Last visit 04/21/21. Breakfast Humalog increased to 4 units. 3 month follow-up planned. Lucio Edward, MD is GI. Chronic N/V improved on scheduled Reglan. As needed follow-up recommended at 10/09/20 visit with Ellouise Newer, PA-C. Since then she was transitioned from PD to HD due to N/V. - Fransico Him, MD is primary cardiologist. Last visit 04/28/21 with Ermalinda Barrios, PA-C for HTN follow-up. TEE 07/08/21 by Oswaldo Milian, MD in setting of bacteremia.   LABS: For ISTAT on arrival. Last results include: Lab Results  Component Value Date   WBC 6.7 07/31/2021   HGB 9.1 (L) 07/31/2021   HCT 27.9 (L) 07/31/2021   PLT 266 07/31/2021   GLUCOSE 165 (H) 07/31/2021   TRIG 149 04/11/2020   ALT 7 07/31/2021   AST 28 07/31/2021   NA 131 (L) 07/31/2021   K 2.7 (LL) 07/31/2021   CL 89 (L) 07/31/2021   CREATININE 6.60 (H) 07/31/2021   BUN 17 07/31/2021   CO2 24 07/31/2021   TSH 2.687 03/21/2021   INR 1.1 07/03/2021   HGBA1C 7.1 (A) 07/23/2021    IMAGES: MRI C/T/L Spine 07/05/21: IMPRESSION: 1. No MRI evidence for acute infection within the cervical, thoracic, or lumbar spine. No epidural abscess or other inflammatory changes. 2. Mild lower lumbar facet hypertrophy.  No significant stenosis. 3. Small layering bilateral pleural effusions.   CT Chest/Abd/Pelvis 07/03/21: IMPRESSION: 1. No convincing acute or inflammatory process; distended urinary bladder (640 mL). 2. Trace lower quadrant free fluid likely related to peritoneal dialysis and decreased from last  month. Right IJ approach hemodialysis catheter also in place.    EKG: 07/25/21: ST at 113 bpm   CV: TEE 07/08/21 (in setting of bacteremia): IMPRESSIONS   1. Left ventricular ejection fraction, by estimation, is 60 to 65%. The  left ventricle has normal function.   2. Right ventricular systolic function is normal. The right ventricular  size is normal.   3. No left atrial/left atrial appendage  thrombus was detected.   4. The mitral valve is normal in structure. Trivial mitral valve  regurgitation.   5. The aortic valve is tricuspid. Aortic valve regurgitation is not  visualized. No aortic stenosis is present.   6. No valvular vegetation seen   7. Tubular, linear mass in the SVC extending into the right atrium that,  while thrombus cannot be excluded, likely represents ghost catheter fibrin  sleeve from recently removed HD line   TTE 07/05/21: IMPRESSIONS   1. Left ventricular ejection fraction, by estimation, is 55 to 60%. The  left ventricle has normal function. The left ventricle has no regional  wall motion abnormalities. There is mild concentric left ventricular  hypertrophy. Left ventricular diastolic  parameters were normal.   2. Right ventricular systolic function is normal. The right ventricular  size is normal. There is normal pulmonary artery systolic pressure.   3. Left atrial size was mildly dilated.   4. The mitral valve is normal in structure. Trivial mitral valve  regurgitation. No evidence of mitral stenosis.   5. The aortic valve is tricuspid. Aortic valve regurgitation is not  visualized. No aortic stenosis is present.   6. The inferior vena cava is normal in size with greater than 50%  respiratory variability, suggesting right atrial pressure of 3 mmHg.   Nuclear stress test 03/18/21 [facility]): - Normal myocardial perfusion study. More specifically, no evidence of any significant ischemia or scar  - Left ventricular systolic function is normal. Post stress the ejection fraction is > 60%. The left ventricle is normal in size.  - No significant coronary calcifications were noted on the attenuation CT.  - Incidental CT findings as below:  Additional CT Findings:  1.No significant coronary artery calcifications.  2.None acute      Past Medical History:  Diagnosis Date   Asthma    as a child, no problems as an adult, no inhaler   Cataract    NS OU    Chronic hypertension during pregnancy, antepartum 08/19/2017   Dehydration 01/28/2018   Depression during pregnancy, antepartum 07/07/2017   6/20: Short trial of zoloft previously, reports didn't help much but also didn't give it a chance Discussed r/b/a SSRIs in pregnancy, agrees to try Zoloft again, rx sent No SI/HI/red flags   Diabetes ([place])    TYPE I. 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   ESRD on peritoneal dialysis ([place])    HTN (hypertension)    Hypertensive retinopathy    OU   Hypokalemia 01/22/2018   Hypomagnesemia 01/28/2018   Intractable nausea and vomiting 01/22/2018   Intrauterine growth restriction (IUGR) affecting care of mother 12/22/2017   Morbid obesity ([place])    Nephropathy, diabetic ([place]) 12/29/2017   Severe hyperemesis gravidarum 10/30/2017   Type I diabetes mellitus ([place]) 07/07/2017   Current Diabetic Medications:  Insulin  [x]  Aspirin 81 mg daily after 12 weeks (? A2/B GDM)  Required Referrals for A1GDM or A2GDM: [x]  Diabetes Education and Equities trader [  x] Nutrition Cousult  For A2/B GDM or higher classes of DM [x]  Diabetes Education and Testing Supplies [x]  Nutrition Counsult [x]  Fetal ECHO after 20 weeks  [x]  Eye exam for retina evaluation - severe retinopathy 7/19  Base   Ventricular septal defect (VSD) of fetus in singleton pregnancy, antepartum 09/30/2017   May go to newborn nursery per Dr. Lenard Simmer Echo prior to discharge    Past Surgical History:  Procedure Laterality Date   25 GAUGE PARS PLANA VITRECTOMY WITH 20 GAUGE MVR PORT FOR MACULAR HOLE Left 07/20/2018   Procedure: 25 [place];  Surgeon: Bernarda Caffey, MD;  Location: [place];  Service: Ophthalmology;  Laterality: Left;   CAPD INSERTION N/A 09/10/2020   Procedure: LAPAROSCOPIC INSERTION CONTINUOUS AMBULATORY PERITONEAL DIALYSIS  (CAPD) CATHETER;  Surgeon: Serafina Mitchell, MD;  Location: [place];  Service: Vascular;  Laterality: N/A;   CESAREAN SECTION N/A 12/26/2017   Procedure: CESAREAN SECTION;  Surgeon: Osborne Oman, MD;  Location: [place];  Service: Obstetrics;  Laterality: N/A;   EYE EXAMINATION UNDER ANESTHESIA Right 07/20/2018   Procedure: Eye Exam Under Anesthesia RIGHT EYE;  Surgeon: Bernarda Caffey, MD;  Location: [place];  Service: Ophthalmology;  Laterality: Right;   EYE SURGERY Left 07/2018   GAS INSERTION Left 07/19/2019   Procedure: INSERTION OF GAS;  Surgeon: Bernarda Caffey, MD;  Location: [place];  Service: Ophthalmology;  Laterality: Left;  SF6   INJECTION OF SILICONE OIL Left 09/20/2353   Procedure: Injection Of Silicone Oil LEFT EYE;  Surgeon: Bernarda Caffey, MD;  Location: [place];  Service: Ophthalmology;  Laterality: Left;   INSERTION OF DIALYSIS CATHETER Right 07/05/2021   Procedure: REMOVAL OF DIALYSIS CATHETER;  Surgeon: Cherre Robins, MD;  Location: [place];  Service: Vascular;  Laterality: Right;   INSERTION OF DIALYSIS CATHETER Right 07/09/2021   Procedure: INSERTION OF TUNNELED DIALYSIS CATHETER;  Surgeon: Cherre Robins, MD;  Location: [place];  Service: Vascular;  Laterality: Right;   INSERTION OF DIALYSIS CATHETER Left 07/09/2021   Procedure: INSERTION OF TUNNELED  DIALYSIS CATHETER;  Surgeon: Cherre Robins, MD;  Location: [place];  Service: Vascular;  Laterality: Left;   IR FLUORO GUIDE CV LINE RIGHT  06/02/2020   IR US GUIDE [place] RIGHT  06/02/2020   LASER PHOTO ABLATION Right 07/20/2018   Procedure: Laser Photo Ablation RIGHT EYE;  Surgeon: Bernarda Caffey, MD;  Location: [place];  Service: Ophthalmology;  Laterality: Right;   MEMBRANE PEEL Left 07/20/2018   Procedure: Membrane Peel LEFT EYE;  Surgeon: Bernarda Caffey, MD;  Location: [place];  Service: Ophthalmology;  Laterality: Left;   MEMBRANE PEEL Left 07/19/2019   Procedure: MEMBRANE PEEL;  Surgeon: Bernarda Caffey, MD;  Location: [place];  Service: Ophthalmology;  Laterality: Left;    MITOMYCIN C APPLICATION Bilateral 07/20/2200   Procedure: Avastin Application;  Surgeon: Bernarda Caffey, MD;  Location: [place];  Service: Ophthalmology;  Laterality: Bilateral;   PHOTOCOAGULATION WITH LASER Left 07/20/2018   Procedure: Photocoagulation With Laser LEFT EYE;  Surgeon: Bernarda Caffey, MD;  Location: [place];  Service: Ophthalmology;  Laterality: Left;   PHOTOCOAGULATION WITH LASER Left 07/19/2019   Procedure: PHOTOCOAGULATION WITH LASER;  Surgeon: Bernarda Caffey, MD;  Location: [place];  Service: Ophthalmology;  Laterality: Left;   RETINAL DETACHMENT SURGERY Left 07/20/2018   Dr. Bernarda Caffey   SILICON OIL REMOVAL Left 07/19/2019   Procedure: 25g PARS PLANA VITRECTOMY WITH SILICON OIL REMOVAL;  Surgeon: Coralyn Pear,  Aaron Edelman, MD;  Location: [place];  Service: Ophthalmology;  Laterality: Left;   TEE WITHOUT CARDIOVERSION N/A 07/08/2021   Procedure: TRANSESOPHAGEAL ECHOCARDIOGRAM (TEE);  Surgeon: Donato Heinz, MD;  Location: [facility] ENDOSCOPY;  Service: Cardiovascular;  Laterality: N/A;   WISDOM TOOTH EXTRACTION      MEDICATIONS: No current facility-administered medications for this encounter.    amLODipine (NORVASC) 10 MG tablet   calcitRIOL (ROCALTROL) 0.5 MCG capsule   carvedilol (COREG) 25 MG tablet   ferrous sulfate 325 (65 FE) MG tablet   FLUoxetine (PROZAC) 40 MG capsule   hydrALAZINE (APRESOLINE) 50 MG tablet   insulin glargine (LANTUS SOLOSTAR) 100 UNIT/ML Solostar Pen   insulin lispro (HUMALOG KWIKPEN) 100 UNIT/ML KwikPen   metoCLOPramide (REGLAN) 5 MG tablet   multivitamin (RENA-VIT) TABS tablet   ondansetron (ZOFRAN-ODT) 8 MG disintegrating tablet   sevelamer carbonate (RENVELA) 800 MG tablet   Continuous Blood Gluc Receiver DEVI   Continuous Blood Gluc Sensor MISC   Continuous Glucose Monitor Sup MISC   glucose blood (FREESTYLE TEST STRIPS) test strip    Myra Gianotti, PA-C Surgical Short Stay/Anesthesiology [facility] Phone [phone] [facility] Phone [phone] 08/10/2021  4:21 PM

## 2021-08-10 NOTE — Progress Notes (Signed)
Patient was called for questions and instructions for surgery day. This writer was able to talk with the patient and ask questions but the phone was disconnected and the patient didn't answer again. Patient was called multiple times and this writer left a message with the time when patient must be at the hospital tomorrow morning and instructions for diet and shower tonight and tomorrow morning.

## 2021-08-11 ENCOUNTER — Ambulatory Visit (HOSPITAL_COMMUNITY): Payer: 59 | Admitting: Vascular Surgery

## 2021-08-11 ENCOUNTER — Ambulatory Visit (HOSPITAL_BASED_OUTPATIENT_CLINIC_OR_DEPARTMENT_OTHER): Payer: 59 | Admitting: Vascular Surgery

## 2021-08-11 ENCOUNTER — Encounter (HOSPITAL_COMMUNITY): Payer: Self-pay | Admitting: Vascular Surgery

## 2021-08-11 ENCOUNTER — Ambulatory Visit (HOSPITAL_COMMUNITY)
Admission: RE | Admit: 2021-08-11 | Discharge: 2021-08-11 | Disposition: A | Discharge status: Home / Self Care | Payer: 59 | Attending: Vascular Surgery | Admitting: Vascular Surgery

## 2021-08-11 ENCOUNTER — Other Ambulatory Visit: Payer: Self-pay

## 2021-08-11 ENCOUNTER — Encounter (HOSPITAL_COMMUNITY)
Admission: RE | Disposition: A | Discharge status: Home / Self Care | Payer: Self-pay | Source: Home / Self Care | Attending: Vascular Surgery

## 2021-08-11 DIAGNOSIS — I120 Hypertensive chronic kidney disease with stage 5 chronic kidney disease or end stage renal disease: Secondary | ICD-10-CM | POA: Diagnosis present

## 2021-08-11 DIAGNOSIS — Z794 Long term (current) use of insulin: Secondary | ICD-10-CM | POA: Insufficient documentation

## 2021-08-11 DIAGNOSIS — N186 End stage renal disease: Secondary | ICD-10-CM

## 2021-08-11 DIAGNOSIS — Z992 Dependence on renal dialysis: Secondary | ICD-10-CM | POA: Diagnosis not present

## 2021-08-11 DIAGNOSIS — E1022 Type 1 diabetes mellitus with diabetic chronic kidney disease: Secondary | ICD-10-CM

## 2021-08-11 DIAGNOSIS — N185 Chronic kidney disease, stage 5: Secondary | ICD-10-CM

## 2021-08-11 DIAGNOSIS — Z79899 Other long term (current) drug therapy: Secondary | ICD-10-CM | POA: Insufficient documentation

## 2021-08-11 HISTORY — PX: AV FISTULA PLACEMENT: SHX1204

## 2021-08-11 LAB — POCT I-STAT, CHEM 8
BUN: 34 mg/dL — ABNORMAL HIGH (ref 6–20)
Calcium, Ion: 1.16 mmol/L (ref 1.15–1.40)
Chloride: 105 mmol/L (ref 98–111)
Creatinine, Ser: 7.5 mg/dL — ABNORMAL HIGH (ref 0.44–1.00)
Glucose, Bld: 204 mg/dL — ABNORMAL HIGH (ref 70–99)
HCT: 31 % — ABNORMAL LOW (ref 36.0–46.0)
Hemoglobin: 10.5 g/dL — ABNORMAL LOW (ref 12.0–15.0)
Potassium: 4.2 mmol/L (ref 3.5–5.1)
Sodium: 137 mmol/L (ref 135–145)
TCO2: 23 mmol/L (ref 22–32)

## 2021-08-11 LAB — GLUCOSE, CAPILLARY
Glucose-Capillary: 176 mg/dL — ABNORMAL HIGH (ref 70–99)
Glucose-Capillary: 169 mg/dL — ABNORMAL HIGH (ref 70–99)
Glucose-Capillary: 178 mg/dL — ABNORMAL HIGH (ref 70–99)

## 2021-08-11 SURGERY — INSERTION OF ARTERIOVENOUS (AV) GORE-TEX GRAFT ARM
Anesthesia: Monitor Anesthesia Care | Site: Arm Upper | Laterality: Left

## 2021-08-11 MED ORDER — FENTANYL CITRATE (PF) 100 MCG/2ML IJ SOLN: 50.0000 ug | Freq: Once | INTRAMUSCULAR | Status: AC

## 2021-08-11 MED ORDER — MIDAZOLAM HCL 2 MG/2ML IJ SOLN: 2.0000 mg | Freq: Once | INTRAMUSCULAR | Status: AC

## 2021-08-11 MED ORDER — ACETAMINOPHEN 500 MG PO TABS
1000.0000 mg | ORAL_TABLET | Freq: Once | ORAL | Status: AC
  Administered 2021-08-11: 1000 mg via ORAL
  Filled 2021-08-11: qty 2

## 2021-08-11 MED ORDER — SODIUM CHLORIDE 0.9 % IV SOLN: INTRAVENOUS | Status: DC

## 2021-08-11 MED ORDER — ROPIVACAINE HCL 5 MG/ML IJ SOLN
INTRAMUSCULAR | Status: DC | PRN
  Administered 2021-08-11: 30 mL via PERINEURAL

## 2021-08-11 MED ORDER — FENTANYL CITRATE (PF) 100 MCG/2ML IJ SOLN
INTRAMUSCULAR | Status: DC | PRN
  Administered 2021-08-11: 50 ug via INTRAVENOUS

## 2021-08-11 MED ORDER — HYDROCODONE-ACETAMINOPHEN 5-325 MG PO TABS: 1.0000 | ORAL_TABLET | Freq: Four times a day (QID) | ORAL | 0 refills | Status: DC | PRN

## 2021-08-11 MED ORDER — PHENYLEPHRINE HCL-NACL 20-0.9 MG/250ML-% IV SOLN
INTRAVENOUS | Status: DC | PRN
  Administered 2021-08-11: 25 ug/min via INTRAVENOUS

## 2021-08-11 MED ORDER — FENTANYL CITRATE (PF) 100 MCG/2ML IJ SOLN
INTRAMUSCULAR | Status: AC
  Administered 2021-08-11: 50 ug via INTRAVENOUS
  Filled 2021-08-11: qty 2

## 2021-08-11 MED ORDER — HEPARIN 6000 UNIT IRRIGATION SOLUTION
Status: DC | PRN
  Administered 2021-08-11: 1

## 2021-08-11 MED ORDER — 0.9 % SODIUM CHLORIDE (POUR BTL) OPTIME
TOPICAL | Status: DC | PRN
  Administered 2021-08-11: 1000 mL

## 2021-08-11 MED ORDER — FENTANYL CITRATE (PF) 100 MCG/2ML IJ SOLN
INTRAMUSCULAR | Status: AC
  Filled 2021-08-11: qty 2

## 2021-08-11 MED ORDER — CHLORHEXIDINE GLUCONATE 0.12 % MT SOLN
OROMUCOSAL | Status: AC
  Administered 2021-08-11: 15 mL via OROMUCOSAL
  Filled 2021-08-11: qty 15

## 2021-08-11 MED ORDER — CEFAZOLIN SODIUM-DEXTROSE 2-4 GM/100ML-% IV SOLN
2.0000 g | INTRAVENOUS | Status: AC
  Administered 2021-08-11: 2 g via INTRAVENOUS

## 2021-08-11 MED ORDER — STERILE WATER FOR IRRIGATION IR SOLN
Status: DC | PRN
  Administered 2021-08-11: 1000 mL

## 2021-08-11 MED ORDER — ORAL CARE MOUTH RINSE: 15.0000 mL | Freq: Once | OROMUCOSAL | Status: AC

## 2021-08-11 MED ORDER — CHLORHEXIDINE GLUCONATE 4 % EX LIQD: 60.0000 mL | Freq: Once | CUTANEOUS | Status: DC

## 2021-08-11 MED ORDER — ONDANSETRON HCL 4 MG/2ML IJ SOLN
INTRAMUSCULAR | Status: DC | PRN
  Administered 2021-08-11: 4 mg via INTRAVENOUS

## 2021-08-11 MED ORDER — CEFAZOLIN SODIUM-DEXTROSE 2-4 GM/100ML-% IV SOLN
INTRAVENOUS | Status: AC
  Filled 2021-08-11: qty 100

## 2021-08-11 MED ORDER — INSULIN ASPART 100 UNIT/ML IJ SOLN: 0.0000 [IU] | INTRAMUSCULAR | Status: DC | PRN

## 2021-08-11 MED ORDER — LIDOCAINE-EPINEPHRINE (PF) 1 %-1:200000 IJ SOLN
INTRAMUSCULAR | Status: AC
  Filled 2021-08-11: qty 30

## 2021-08-11 MED ORDER — HEMOSTATIC AGENTS (NO CHARGE) OPTIME
TOPICAL | Status: DC | PRN
  Administered 2021-08-11: 3 via TOPICAL

## 2021-08-11 MED ORDER — CHLORHEXIDINE GLUCONATE 0.12 % MT SOLN: 15.0000 mL | Freq: Once | OROMUCOSAL | Status: AC

## 2021-08-11 MED ORDER — SODIUM CHLORIDE 0.9 % IV SOLN
INTRAVENOUS | Status: DC
Start: 2021-08-11 — End: 2021-08-11

## 2021-08-11 MED ORDER — PROMETHAZINE HCL 25 MG/ML IJ SOLN: 6.2500 mg | INTRAMUSCULAR | Status: DC | PRN

## 2021-08-11 MED ORDER — PROPOFOL 500 MG/50ML IV EMUL
INTRAVENOUS | Status: DC | PRN
  Administered 2021-08-11: 100 ug/kg/min via INTRAVENOUS

## 2021-08-11 MED ORDER — HEPARIN 6000 UNIT IRRIGATION SOLUTION
Status: AC
  Filled 2021-08-11: qty 500

## 2021-08-11 MED ORDER — HEPARIN SODIUM (PORCINE) 1000 UNIT/ML IJ SOLN
2.1000 mL | Freq: Once | INTRAMUSCULAR | Status: AC
  Administered 2021-08-11: 2100 [IU] via INTRAVENOUS

## 2021-08-11 MED ORDER — FENTANYL CITRATE (PF) 100 MCG/2ML IJ SOLN
25.0000 ug | INTRAMUSCULAR | Status: DC | PRN
  Administered 2021-08-11: 50 ug via INTRAVENOUS

## 2021-08-11 MED ORDER — FENTANYL CITRATE (PF) 250 MCG/5ML IJ SOLN
INTRAMUSCULAR | Status: AC
  Filled 2021-08-11: qty 5

## 2021-08-11 MED ORDER — HEPARIN SODIUM (PORCINE) 1000 UNIT/ML IJ SOLN
INTRAMUSCULAR | Status: DC | PRN
  Administered 2021-08-11: 2000 [IU] via INTRAVENOUS

## 2021-08-11 MED ORDER — MIDAZOLAM HCL 2 MG/2ML IJ SOLN
INTRAMUSCULAR | Status: AC
  Administered 2021-08-11: 2 mg via INTRAVENOUS
  Filled 2021-08-11: qty 2

## 2021-08-11 SURGICAL SUPPLY — 37 items
ARMBAND PINK RESTRICT EXTREMIT (MISCELLANEOUS) ×2 IMPLANT
BAG COUNTER SPONGE SURGICOUNT (BAG) ×2 IMPLANT
BLADE CLIPPER SURG (BLADE) ×2 IMPLANT
BNDG ELASTIC 4X5.8 VLCR STR LF (GAUZE/BANDAGES/DRESSINGS) ×1 IMPLANT
BNDG ELASTIC 6X10 VLCR STRL LF (GAUZE/BANDAGES/DRESSINGS) ×1 IMPLANT
CANISTER SUCT 3000ML PPV (MISCELLANEOUS) ×2 IMPLANT
CLIP LIGATING EXTRA MED SLVR (CLIP) ×2 IMPLANT
CLIP LIGATING EXTRA SM BLUE (MISCELLANEOUS) ×2 IMPLANT
CLIP VESOCCLUDE MED 6/CT (CLIP) ×2 IMPLANT
COVER PROBE W GEL 5X96 (DRAPES) ×2 IMPLANT
DERMABOND ADVANCED (GAUZE/BANDAGES/DRESSINGS) ×1
DERMABOND ADVANCED .7 DNX12 (GAUZE/BANDAGES/DRESSINGS) ×1 IMPLANT
ELECT REM PT RETURN 9FT ADLT (ELECTROSURGICAL) ×2
ELECTRODE REM PT RTRN 9FT ADLT (ELECTROSURGICAL) ×1 IMPLANT
GLOVE BIO SURGEON STRL SZ7.5 (GLOVE) ×2 IMPLANT
GLOVE BIOGEL PI IND STRL 8 (GLOVE) ×1 IMPLANT
GLOVE BIOGEL PI INDICATOR 8 (GLOVE) ×1
GLOVE SRG 8 PF TXTR STRL LF DI (GLOVE) ×1 IMPLANT
GLOVE SURG POLYISO LF SZ8 (GLOVE) IMPLANT
GLOVE SURG UNDER POLY LF SZ8 (GLOVE) ×2
GOWN STRL REUS W/ TWL LRG LVL3 (GOWN DISPOSABLE) ×2 IMPLANT
GOWN STRL REUS W/TWL 2XL LVL3 (GOWN DISPOSABLE) ×4 IMPLANT
GOWN STRL REUS W/TWL LRG LVL3 (GOWN DISPOSABLE) ×4
GRAFT GORETEX STRT 4-7X45 (Vascular Products) ×1 IMPLANT
HEMOSTAT SNOW SURGICEL 2X4 (HEMOSTASIS) ×3 IMPLANT
KIT BASIN OR (CUSTOM PROCEDURE TRAY) ×2 IMPLANT
KIT TURNOVER KIT B (KITS) ×2 IMPLANT
NS IRRIG 1000ML POUR BTL (IV SOLUTION) ×2 IMPLANT
PACK CV ACCESS (CUSTOM PROCEDURE TRAY) ×2 IMPLANT
PAD ARMBOARD 7.5X6 YLW CONV (MISCELLANEOUS) ×4 IMPLANT
SUT MNCRL AB 4-0 PS2 18 (SUTURE) ×3 IMPLANT
SUT PROLENE 6 0 BV (SUTURE) ×6 IMPLANT
SUT VIC AB 3-0 SH 27 (SUTURE) ×4
SUT VIC AB 3-0 SH 27X BRD (SUTURE) ×2 IMPLANT
TOWEL GREEN STERILE (TOWEL DISPOSABLE) ×2 IMPLANT
UNDERPAD 30X36 HEAVY ABSORB (UNDERPADS AND DIAPERS) ×2 IMPLANT
WATER STERILE IRR 1000ML POUR (IV SOLUTION) ×2 IMPLANT

## 2021-08-11 NOTE — Progress Notes (Signed)
Notified Dr. Deatra Canter of pt's blood pressure. No new orders, continue to monitor.

## 2021-08-11 NOTE — Discharge Instructions (Signed)
° °  [facility] ° °Discharge Instructions ° °AV Fistula or Graft Surgery for Dialysis Access ° °Please refer to the following instructions for your post-procedure care. Your surgeon or physician assistant will discuss any changes with you. ° °Activity ° °You may drive the day following your surgery, if you are comfortable and no longer taking prescription pain medication. Resume full activity as the soreness in your incision resolves. ° °Bathing/Showering ° °You may shower after you go home. Keep your incision dry for 48 hours. Do not soak in a bathtub, hot tub, or swim until the incision heals completely. You may not shower if you have a hemodialysis catheter. ° °Incision Care ° °Clean your incision with mild soap and water after 48 hours. Pat the area dry with a clean towel. You do not need a bandage unless otherwise instructed. Do not apply any ointments or creams to your incision. You may have skin glue on your incision. Do not peel it off. It will come off on its own in about one week. Your arm may swell a bit after surgery. To reduce swelling use pillows to elevate your arm so it is above your heart. Your doctor will tell you if you need to lightly wrap your arm with an ACE bandage. ° °Diet ° °Resume your normal diet. There are not special food restrictions following this procedure. In order to heal from your surgery, it is CRITICAL to get adequate nutrition. Your body requires vitamins, minerals, and protein. Vegetables are the best source of vitamins and minerals. Vegetables also provide the perfect balance of protein. Processed food has little nutritional value, so try to avoid this. ° °Medications ° °Resume taking all of your medications. If your incision is causing pain, you may take over-the counter pain relievers such as acetaminophen (Tylenol). If you were prescribed a stronger pain medication, please be aware these medications can cause nausea and constipation. Prevent  nausea by taking the medication with a snack or meal. Avoid constipation by drinking plenty of fluids and eating foods with high amount of fiber, such as fruits, vegetables, and grains. Do not take Tylenol if you are taking prescription pain medications. ° ° ° ° °Follow up °Your surgeon may want to see you in the office following your access surgery. If so, this will be arranged at the time of your surgery. ° °Please call us immediately for any of the following conditions: ° °Increased pain, redness, drainage (pus) from your incision site °Fever of 101 degrees or higher °Severe or worsening pain at your incision site °Hand pain or numbness. ° °Reduce your risk of vascular disease: ° °Stop smoking. If you would like help, call [facility] at 1-800-QUIT-NOW (1-800-784-8669) or [facility] at 336-586-4000 ° °Manage your cholesterol °Maintain a desired weight °Control your diabetes °Keep your blood pressure down ° °Dialysis ° °It will take several weeks to several months for your new dialysis access to be ready for use. Your surgeon will determine when it is OK to use it. Your nephrologist will continue to direct your dialysis. You can continue to use your Permcath until your new access is ready for use. ° °If you have any questions, please call the office at [phone]. ° °

## 2021-08-11 NOTE — Anesthesia Postprocedure Evaluation (Signed)
Anesthesia Post Note  Patient: CHINENYE KATZENBERGER  Procedure(s) Performed: INSERTION OF LEFT ARM ARTERIOVENOUS (AV) GORE-TEX GRAFT (Left: Arm Upper)     Patient location during evaluation: PACU Anesthesia Type: Regional Level of consciousness: awake and alert Pain management: pain level controlled Vital Signs Assessment: post-procedure vital signs reviewed and stable Respiratory status: spontaneous breathing, nonlabored ventilation, respiratory function stable and patient connected to nasal cannula oxygen Cardiovascular status: stable and blood pressure returned to baseline Postop Assessment: no apparent nausea or vomiting Anesthetic complications: no   No notable events documented.  Last Vitals:  Vitals:   08/11/21 1240 08/11/21 1255  BP: (!) 175/104 (!) 157/103  Pulse: 77 72  Resp: 10 11  Temp:    SpO2: 95% 95%    Last Pain:  Vitals:   08/11/21 1255  TempSrc:   PainSc: Asleep                 Tiajuana Amass

## 2021-08-11 NOTE — Anesthesia Procedure Notes (Signed)
Anesthesia Regional Block: Axillary brachial plexus block   Pre-Anesthetic Checklist: , timeout performed,  Correct Patient, Correct Site, Correct Laterality,  Correct Procedure, Correct Position, site marked,  Risks and benefits discussed,  Surgical consent,  Pre-op evaluation,  At surgeon's request and post-op pain management  Laterality: Left  Prep: chloraprep       Needles:  Injection technique: Single-shot  Needle Type: Echogenic Stimulator Needle     Needle Length: 9cm  Needle Gauge: 21     Additional Needles:   Procedures:, nerve stimulator,,, ultrasound used (permanent image in chart),,     Nerve Stimulator or Paresthesia:  Response: MC, median, radial and ulnar responses, 0.5 mA  Additional Responses:   Narrative:  Start time: 08/12/2021 9:08 AM End time: 08/12/2021 9:15 AM Injection made incrementally with aspirations every 5 mL.  Performed by: Personally  Anesthesiologist: Suzette Battiest, MD

## 2021-08-11 NOTE — Op Note (Addendum)
    NAME: Barbara Donovan    MRN: [id number] DOB: 10/19/1990    DATE OF OPERATION: 08/11/2021  PREOP DIAGNOSIS:    End-stage renal disease  POSTOP DIAGNOSIS:    Same  PROCEDURE:    Left arm brachial artery to axillary vein arteriovenous graft  SURGEON: Broadus John  ASSIST: Dagoberto Ligas, PA  ANESTHESIA: Moderate, block  EBL: 25 mL  INDICATIONS:    Barbara Donovan is a 31 y.o. female with end-stage renal disease and need for long-term HD access.  Vein mapping demonstrates no usable autogenous vein for dialysis.  After discussing the risk and benefits of left-sided brachial artery axillary vein AV graft, Barbara Donovan elected to proceed.  FINDINGS:   3 mm brachial artery, 8 mm axillary vein  TECHNIQUE:   After informed consent was obtained, the patient was brought to the operating room, placed supine on the operating room table.  Moderate anesthesia was used.  The patient was prepped and draped in normal sterile fashion.  Surgical time-out was taken. Pre-op antibiotics were given.  Procedure began with using ultrasound and sent the brachial artery and axillary vein in the left arm.  Both proved of sufficient size to continue with AV graft.  A longitudinal incision was made at the distal aspect of the humerus. The brachial artery was identified, gently dissected, and encircled with silastic loops. A second incision was then made over the axillary vein and similarly dissected and encircled. Hematoma from the axillary block appreciated in the axilla. The tunneling device was then passed between these incisions. A 4 X 7 taper PTFE graft was passed through the tunnel, taking care to avoid kinking or twists. The graft was irrigated with heparinized saline solution.   Proximal and distal arterial control was then obtained and a longitudinal arteriotomy was made on the brachial artery. The artery was irrigated with heparinized saline solution. An end to side artery anastomosis was then  performed from the 44mm graft to the brachial vein in a running fashion using running 6-0 prolene suture. When the clamps were removed from the artery, the graft demonstrated excellent inflow. It was flushed with heparinized saline prior to being clamped. There was an excellent signal at the wrist.  Attention was then directed to the axillary vein anastomosis. Proximal and distal control were obtained and a longitudinal venotomy was made. The vein was then irrigated with heparinized saline solution. The end of the PTFE graft was then cut in a beveled fashion at the appropriate length. An end to side vein anastomosis was then performed, using a running, 6-0 prolene suture. Prior to establishing flow, all vessels were back bled and flushed to ensure there was no clot.    There was a strong distal signal in the radial artery and a thrill in the vein centrally. Hemostasis was found to be satisfactory, and all wounds were irrigated with saline solution and closed in layers with vicryl and Monocryl at the skin. A dry sterile dressing was applied. Anesthetic care was terminated. The patient was transported to the recovery room in stable condition.   Macie Burows, MD [facility] DATE OF DICTATION:   08/11/2021

## 2021-08-11 NOTE — Progress Notes (Signed)
[facility] [place] PROGRESS NOTE  Patient seen and examined in preop holding.  No complaints. No changes to medication history or physical exam since last seen in clinic. After discussing the risks and benefits of left arm AV graft for permanent HD access, Barbara Donovan elected to proceed. She is aware that with her small size and the small size of her arteries she is at increased risk of steal syndrome  Broadus John MD   ASSESSMENT / PLAN: Barbara Donovan is a 31 y.o. female with ESRD in need of permanent dialysis access. She is right handed. She is currently dialyzing via left jugular tunneled dialysis catheter. Plan left arm arteriovenous graft as her dialysis schedule allows.   SUBJECTIVE: No complaints. No further fevers, chills, rigors.   OBJECTIVE: BP (!) 169/99   Pulse 82   Temp 98.9 F (37.2 C) (Oral)   Resp 19   Ht 5\' 6"  (1.676 m)   Wt 64.9 kg   SpO2 96%   BMI 23.08 kg/m   Chronically ill appearing young woman in no distress Regular rate and rhythm 2+ brachial pulses     Latest Ref Rng & Units 07/31/2021    4:40 AM 07/31/2021    4:01 AM 07/28/2021    5:03 PM  CBC  WBC 4.0 - 10.5 K/uL 6.7   7.6   Hemoglobin 12.0 - 15.0 g/dL 9.1  9.5  9.5   Hematocrit 36.0 - 46.0 % 27.9  28.0  29.9   Platelets 150 - 400 K/uL 266   347         Latest Ref Rng & Units 07/31/2021    4:01 AM 07/31/2021    2:38 AM 07/28/2021    5:03 PM  CMP  Glucose 70 - 99 mg/dL  165  224   BUN 6 - 20 mg/dL  17  28   Creatinine 0.44 - 1.00 mg/dL  6.60  6.34   Sodium 135 - 145 mmol/L 131  127  136   Potassium 3.5 - 5.1 mmol/L 2.7  3.5  3.2   Chloride 98 - 111 mmol/L  89  101   CO2 22 - 32 mmol/L  24  22   Calcium 8.9 - 10.3 mg/dL  10.1  10.6   Total Protein 6.5 - 8.1 g/dL  8.3  7.9   Total Bilirubin 0.3 - 1.2 mg/dL  0.7  0.9   Alkaline Phos 38 - 126 U/L  64  72   AST 15 - 41 U/L  28  18   ALT 0 - 44 U/L  7  8     Estimated Creatinine Clearance: 11.7 mL/min  (A) (by C-G formula based on SCr of 6.6 mg/dL (H)).  Vein mapping shows no usable autogenous vein for dialysis access creation  [employer]. Stanford Breed, MD [facility] Phone Number: [phone] 08/11/2021 9:41 AM

## 2021-08-11 NOTE — Transfer of Care (Signed)
Immediate Anesthesia Transfer of Care Note  Patient: Barbara Donovan  Procedure(s) Performed: INSERTION OF LEFT ARM ARTERIOVENOUS (AV) GORE-TEX GRAFT (Left: Arm Upper)  Patient Location: PACU  Anesthesia Type:MAC combined with regional for post-op pain  Level of Consciousness: drowsy  Airway & Oxygen Therapy: Patient Spontanous Breathing and Patient connected to nasal cannula oxygen  Post-op Assessment: Report given to RN and Post -op Vital signs reviewed and stable  Post vital signs: Reviewed and stable  Last Vitals:  Vitals Value Taken Time  BP 173/100 08/11/21 1207  Temp    Pulse 84 08/11/21 1211  Resp 19 08/11/21 1211  SpO2 93 % 08/11/21 1211  Vitals shown include unvalidated device data.  Last Pain:  Vitals:   08/11/21 0950  TempSrc:   PainSc: 0-No pain         Complications: No notable events documented.

## 2021-08-11 NOTE — Progress Notes (Signed)
Orthopedic Tech Progress Note Patient Details:  Barbara Donovan Apr 04, 1990 [id number]  PACU RN called requesting an ARM SLING, RN said she'll apply the sling once patient is dressed   Ortho Devices Type of Ortho Device: Arm sling Ortho Device/Splint Location: LUE Ortho Device/Splint Interventions: Ordered   Post Interventions Patient Tolerated: Well Instructions Provided: [facility] [place] 08/11/2021, 1:02 PM

## 2021-08-11 NOTE — H&P (Signed)
[facility] [place] PROGRESS NOTE  Patient seen and examined in preop holding.  No complaints. No changes to medication history or physical exam since last seen in clinic. After discussing the risks and benefits of left arm AV graft for permanent HD access, Barbara Donovan elected to proceed. She is aware that with her small size and the small size of her arteries she is at increased risk of steal syndrome  Barbara John MD   ASSESSMENT / PLAN: Barbara Donovan is a 31 y.o. female with ESRD in need of permanent dialysis access. She is right handed. She is currently dialyzing via left jugular tunneled dialysis catheter. Plan left arm arteriovenous graft as her dialysis schedule allows.   SUBJECTIVE: No complaints. No further fevers, chills, rigors.   OBJECTIVE: BP (!) 183/104   Pulse 83   Temp 98.9 F (37.2 C) (Oral)   Resp 19   Ht 5\' 6"  (1.676 m)   Wt 64.9 kg   SpO2 99%   BMI 23.08 kg/m   Chronically ill appearing young woman in no distress Regular rate and rhythm 2+ brachial pulses     Latest Ref Rng & Units 08/11/2021    8:13 AM 07/31/2021    4:40 AM 07/31/2021    4:01 AM  CBC  WBC 4.0 - 10.5 K/uL  6.7    Hemoglobin 12.0 - 15.0 g/dL 10.5  9.1  9.5   Hematocrit 36.0 - 46.0 % 31.0  27.9  28.0   Platelets 150 - 400 K/uL  266          Latest Ref Rng & Units 08/11/2021    8:13 AM 07/31/2021    4:01 AM 07/31/2021    2:38 AM  CMP  Glucose 70 - 99 mg/dL 204   165   BUN 6 - 20 mg/dL 34   17   Creatinine 0.44 - 1.00 mg/dL 7.50   6.60   Sodium 135 - 145 mmol/L 137  131  127   Potassium 3.5 - 5.1 mmol/L 4.2  2.7  3.5   Chloride 98 - 111 mmol/L 105   89   CO2 22 - 32 mmol/L   24   Calcium 8.9 - 10.3 mg/dL   10.1   Total Protein 6.5 - 8.1 g/dL   8.3   Total Bilirubin 0.3 - 1.2 mg/dL   0.7   Alkaline Phos 38 - 126 U/L   64   AST 15 - 41 U/L   28   ALT 0 - 44 U/L   7     Estimated Creatinine Clearance: 10.3 mL/min (A) (by C-G formula based on  SCr of 7.5 mg/dL (H)).  Vein mapping shows no usable autogenous vein for dialysis access creation  [employer]. Stanford Breed, MD [facility] Phone Number: [phone] 08/11/2021 12:11 PM

## 2021-08-12 ENCOUNTER — Encounter (HOSPITAL_COMMUNITY): Payer: Self-pay | Admitting: Vascular Surgery

## 2021-08-12 NOTE — Addendum Note (Signed)
Addendum  created 08/12/21 1433 by Suzette Battiest, MD   Clinical Note Signed, Intraprocedure Blocks edited, SmartForm saved

## 2021-09-09 ENCOUNTER — Telehealth: Payer: Self-pay | Admitting: *Deleted

## 2021-09-09 NOTE — Telephone Encounter (Signed)
Spoke with Dr. Cain Sieve -patient to have doctor at Dialysis lok at when she goes for treatment today.  If doctor does not look at patient was advised to go to Urgent Care after her Dialysis treatment today.  Patient voiced understanding of plan and will do.

## 2021-09-09 NOTE — Telephone Encounter (Addendum)
Call frmpatient. Has a rash over most of her back that has spread.  Had a previous rash before that was a staph infection.  Says this is acting similar to that .  Itches and hurts.  Patient states no drainage at present.  Patient is on the way to Dialysis and does not get out until 3:30 PM.  Was given an appointment for 09/17/2021.  Patient will be unavailable after 10;30 am to talk with as has no reception at th [facility].

## 2021-09-10 NOTE — Progress Notes (Signed)
POST OPERATIVE OFFICE NOTE    CC:  F/u for surgery  HPI:  This is a 31 y.o. female who is s/p Left arm brachial artery to axillary vein arteriovenous graft on 08/11/21 by Dr. Virl Cagey.  She is ESRD She is currently dialyzing via left jugular tunneled dialysis catheter.  Pt returns today for follow up.  Pt denise fever, chills, hand pain or loss of function.      No Known Allergies  Current Outpatient Medications  Medication Sig Dispense Refill   amLODipine (NORVASC) 10 MG tablet Take 1 tablet (10 mg total) by mouth daily. (Patient taking differently: Take 10 mg by mouth every evening.) 90 tablet 1   calcitRIOL (ROCALTROL) 0.5 MCG capsule Take 0.5 mcg by mouth every Monday, Wednesday, and Friday with hemodialysis.     carvedilol (COREG) 25 MG tablet Take 1 tablet (25 mg total) by mouth 2 (two) times daily. 180 tablet 3   Continuous Blood Gluc Receiver DEVI 1 Units by Does not apply route 4 (four) times daily -  before meals and at bedtime. May substitute for cheapest monitor with insurance 1 Units 0   Continuous Blood Gluc Sensor MISC 1 each by Does not apply route as directed. Use as directed every 14 days. May dispense FreeStyle [employer] or similar. 1 each 0   Continuous Glucose Monitor Sup MISC 1 Units by Does not apply route 4 (four) times daily -  before meals and at bedtime. May substitute for cheapest option with patient insurance 1 Units 0   ferrous sulfate 325 (65 FE) MG tablet Take 325 mg by mouth daily.     FLUoxetine (PROZAC) 40 MG capsule Take 40 mg by mouth every morning.     glucose blood (FREESTYLE TEST STRIPS) test strip Use as instructed 100 each 0   hydrALAZINE (APRESOLINE) 50 MG tablet Take 1 tablet by mouth twice a day, may take 1 extra tablet by mouth daily only as needed for elevated blood pressure (Patient taking differently: Take 50 mg by mouth 3 (three) times daily.) 270 tablet 3   HYDROcodone-acetaminophen (NORCO) 5-325 MG tablet Take 1 tablet by mouth  every 6 (six) hours as needed for moderate pain. 15 tablet 0   insulin glargine (LANTUS SOLOSTAR) 100 UNIT/ML Solostar Pen Inject 20 Units into the skin every morning. (Patient taking differently: Inject 20 Units into the skin at bedtime.) 15 mL 2   insulin lispro (HUMALOG KWIKPEN) 100 UNIT/ML KwikPen 3 times a day (just before each meal) 4-3-3 units, and pen needles 4/day (Patient taking differently: Inject 3-4 Units into the skin See admin instructions. 4 units with breakfast 3 units with lunch and dinner) 15 mL 3   metoCLOPramide (REGLAN) 5 MG tablet Take 1 tablet (5 mg total) by mouth 3 (three) times daily before meals. (Patient taking differently: Take 5 mg by mouth daily as needed for nausea.) 90 tablet 0   multivitamin (RENA-VIT) TABS tablet Take 1 tablet by mouth at bedtime. 90 tablet 1   ondansetron (ZOFRAN-ODT) 8 MG disintegrating tablet Take 1 tablet (8 mg total) by mouth every 8 (eight) hours as needed for nausea or vomiting. 20 tablet 0   sevelamer carbonate (RENVELA) 800 MG tablet Take 1 tablet (800 mg total) by mouth 3 (three) times daily with meals. 90 tablet 1   No current facility-administered medications for this visit.     ROS:  See HPI  Physical Exam:    Incision:  well healed without edema or erythema Grip  5/5 and sensation intact Palpable radial pulse and thrill in graft    Assessment/Plan:  31 y.o. female with ESRD s/p left UE AV graft and TDC.  The graft can be accessed for HD once working properly the [facility] can be discontinued.  F/U PRN     Roxy Horseman PA-C [facility] [phone]    MD on call :  Trula Slade

## 2021-09-11 ENCOUNTER — Ambulatory Visit (INDEPENDENT_AMBULATORY_CARE_PROVIDER_SITE_OTHER): Payer: 59 | Admitting: Physician Assistant

## 2021-09-11 VITALS — BP 159/94 | HR 87 | Temp 97.3°F | Resp 14 | Ht 66.0 in | Wt 140.0 lb

## 2021-09-11 DIAGNOSIS — Z992 Dependence on renal dialysis: Secondary | ICD-10-CM

## 2021-09-11 DIAGNOSIS — N186 End stage renal disease: Secondary | ICD-10-CM

## 2021-09-17 ENCOUNTER — Encounter: Payer: 59 | Admitting: Internal Medicine

## 2021-09-17 ENCOUNTER — Ambulatory Visit: Payer: 59

## 2021-09-29 ENCOUNTER — Encounter: Payer: 59 | Admitting: Internal Medicine

## 2021-09-29 NOTE — Progress Notes (Deleted)
Presenting for rash   Type 1 DM Last a1c 7.1 Medications Lantus 20 units, humalog 4-3-3 units Glucometer?  HTN Coreg 25 mg BID, amlodipine 10 mg qd, hydralazine 50 mg BID  ESRD on HD Was PD for several years, but had to switch to HD earlier this year. 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

## 2021-10-13 ENCOUNTER — Encounter: Payer: Self-pay | Admitting: Internal Medicine

## 2021-10-13 ENCOUNTER — Ambulatory Visit (INDEPENDENT_AMBULATORY_CARE_PROVIDER_SITE_OTHER): Payer: 59 | Admitting: Internal Medicine

## 2021-10-13 VITALS — BP 144/90 | HR 75 | Ht 66.0 in | Wt 146.0 lb

## 2021-10-13 DIAGNOSIS — E1065 Type 1 diabetes mellitus with hyperglycemia: Secondary | ICD-10-CM

## 2021-10-13 DIAGNOSIS — N186 End stage renal disease: Secondary | ICD-10-CM | POA: Diagnosis not present

## 2021-10-13 DIAGNOSIS — E1022 Type 1 diabetes mellitus with diabetic chronic kidney disease: Secondary | ICD-10-CM

## 2021-10-13 DIAGNOSIS — E10319 Type 1 diabetes mellitus with unspecified diabetic retinopathy without macular edema: Secondary | ICD-10-CM

## 2021-10-13 DIAGNOSIS — Z992 Dependence on renal dialysis: Secondary | ICD-10-CM

## 2021-10-13 LAB — POCT GLYCOSYLATED HEMOGLOBIN (HGB A1C): Hemoglobin A1C: 7.7 % — AB (ref 4.0–5.6)

## 2021-10-13 LAB — POCT GLUCOSE (DEVICE FOR HOME USE): POC Glucose: 178 mg/dl — AB (ref 70–99)

## 2021-10-13 MED ORDER — LANTUS SOLOSTAR 100 UNIT/ML ~~LOC~~ SOPN: 16.0000 [IU] | PEN_INJECTOR | SUBCUTANEOUS | 6 refills | Status: DC

## 2021-10-13 MED ORDER — INSULIN PEN NEEDLE 32G X 4 MM MISC: 1.0000 | Freq: Four times a day (QID) | 3 refills | Status: DC

## 2021-10-13 MED ORDER — DEXCOM G7 SENSOR MISC: 1.0000 | 3 refills | Status: DC

## 2021-10-13 MED ORDER — INSULIN LISPRO (1 UNIT DIAL) 100 UNIT/ML (KWIKPEN): PEN_INJECTOR | SUBCUTANEOUS | 3 refills | Status: DC

## 2021-10-13 NOTE — Progress Notes (Signed)
Name: Barbara Donovan  Age/ Sex: 31 y.o., female   MRN/ DOB: [id number], 1990-11-15     PCP: Timothy Lasso, MD (Inactive)   Reason for Endocrinology Evaluation: Type 1 Diabetes Mellitus  Initial Endocrine Consultative Visit: 03/12/2017    PATIENT IDENTIFIER: Barbara Donovan is a 31 y.o. female with a past medical history of T1DM ,dyslipidemia and ESRD . The patient has followed with Endocrinology clinic since 03/12/2017 for consultative assistance with management of her diabetes.  DIABETIC HISTORY:  Barbara Donovan was diagnosed with DM in 2003, she was on insulin pump between 2020-2022, she stopped due to developing DKA. Her hemoglobin A1c has ranged from 6.6% in 2021, peaking at 12.0% in 2020.    She was followed by Dr. Loanne Drilling from 2019 until April 2023  SUBJECTIVE:   During the last visit (04/21/2021): Saw Dr. Loanne Drilling      Today (10/13/2021): Barbara Donovan is here for follow-up on diabetes management.  She checks her blood sugars 2 daily , freestyle Elenor Legato is cost prohobituve . The patient has  had hypoglycemic episodes since the last clinic visit, which typically occur  rarely, she is symptomatic.  Patient is s/p left arm arteriovenous graft 07/2021, this was attributed to peritoneal dialysis  She had multiple ED visit in July for nausea and vomiting  Dialysis Monday, Wednesday and Friday    No recent nausea, vomiting or diarrhea   She is on the transplant list    HOME DIABETES REGIMEN:  Lantus 20 units daily  Humalog 4 units with Breakfast, 3 units with Lunch and Supper      METER DOWNLOAD SUMMARY: Did not bring     DIABETIC COMPLICATIONS: Microvascular complications:  ESRD on HD, Left eye DR  Denies:  Last Eye Exam: Completed 02/18/2021  Macrovascular complications:   Denies: CAD, CVA, PVD   HISTORY:  Past Medical History:  Past Medical History:  Diagnosis Date   Asthma    as a child, no problems as an adult, no inhaler   Cataract    NS OU    Chronic hypertension during pregnancy, antepartum 08/19/2017   Dehydration 01/28/2018   Depression during pregnancy, antepartum 07/07/2017   6/20: Short trial of zoloft previously, reports didn't help much but also didn't give it a chance Discussed r/b/a SSRIs in pregnancy, agrees to try Zoloft again, rx sent No SI/HI/red flags   Diabetes ([place])    TYPE I. 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 A2/B GDM)  Required Referrals for A1GDM or A2GDM: [x]  Diabetes Education and Testing Supplies [x]  Nutrition Cousult  For A2/B GDM or higher classes of DM [x]  Diabetes Education and Testing Supplies [x]  Nutrition Counsult [x]  Fetal ECHO after 20 weeks  [x]  Eye exam for retina evaluation - severe retinopathy 7/19  Base   Ventricular septal defect (VSD) of fetus in singleton pregnancy, antepartum 09/30/2017   May go to  newborn nursery per Dr. Lenard Simmer Echo prior to discharge   Past Surgical History:  Past Surgical History:  Procedure Laterality Date   25 GAUGE PARS PLANA VITRECTOMY WITH 20 GAUGE MVR PORT FOR MACULAR HOLE Left 07/20/2018   Procedure: 25 GAUGE PARS PLANA VITRECTOMY LEFT EYE;  Surgeon: Bernarda Caffey, MD;  Location: [place];  Service: Ophthalmology;  Laterality: Left;   AV FISTULA PLACEMENT Left 08/11/2021   Procedure: INSERTION OF LEFT ARM ARTERIOVENOUS (AV) GORE-TEX GRAFT;  Surgeon: Broadus John, MD;  Location: [facility];  Service: Vascular;  Laterality: Left;  PERIPHERAL NERVE BLOCK   CAPD INSERTION N/A 09/10/2020   Procedure: LAPAROSCOPIC INSERTION CONTINUOUS AMBULATORY PERITONEAL DIALYSIS  (CAPD) CATHETER;  Surgeon: Serafina Mitchell, MD;  Location: [place];  Service: Vascular;  Laterality: N/A;   CESAREAN SECTION N/A 12/26/2017   Procedure: CESAREAN SECTION;  Surgeon: Osborne Oman, MD;  Location: [place];  Service: Obstetrics;  Laterality: N/A;   EYE EXAMINATION UNDER ANESTHESIA Right 07/20/2018   Procedure: Eye Exam Under Anesthesia RIGHT EYE;  Surgeon: Bernarda Caffey, MD;  Location: [place];  Service: Ophthalmology;  Laterality: Right;   EYE SURGERY Left 07/2018   GAS INSERTION Left 07/19/2019   Procedure: INSERTION OF GAS;  Surgeon: Bernarda Caffey, MD;  Location: [facility];  Service: Ophthalmology;  Laterality: Left;  SF6   INJECTION OF SILICONE OIL Left 03/22/7423   Procedure: Injection Of Silicone Oil LEFT EYE;  Surgeon: Bernarda Caffey, MD;  Location: [facility];  Service: Ophthalmology;  Laterality: Left;   INSERTION OF DIALYSIS CATHETER Right 07/05/2021   Procedure: REMOVAL OF DIALYSIS CATHETER;  Surgeon: Cherre Robins, MD;  Location: [facility];  Service: Vascular;  Laterality: Right;   INSERTION OF DIALYSIS CATHETER Right 07/09/2021   Procedure: INSERTION OF TUNNELED DIALYSIS CATHETER;  Surgeon: Cherre Robins, MD;  Location: [facility];  Service: Vascular;  Laterality: Right;   INSERTION OF DIALYSIS CATHETER Left 07/09/2021   Procedure: INSERTION OF TUNNELED  DIALYSIS CATHETER;  Surgeon: Cherre Robins, MD;  Location: [facility];  Service: Vascular;  Laterality: Left;   IR FLUORO GUIDE CV LINE RIGHT  06/02/2020   IR US GUIDE [place] RIGHT  06/02/2020   LASER PHOTO ABLATION Right 07/20/2018   Procedure: Laser Photo Ablation RIGHT EYE;  Surgeon: Bernarda Caffey, MD;  Location: [facility];  Service: Ophthalmology;  Laterality: Right;   MEMBRANE PEEL Left 07/20/2018   Procedure: Membrane Peel LEFT  EYE;  Surgeon: Bernarda Caffey, MD;  Location: [place];  Service: Ophthalmology;  Laterality: Left;   MEMBRANE PEEL Left 07/19/2019   Procedure: MEMBRANE PEEL;  Surgeon: Bernarda Caffey, MD;  Location: [facility];  Service: Ophthalmology;  Laterality: Left;   MITOMYCIN C APPLICATION Bilateral 09/22/6385   Procedure: Avastin Application;  Surgeon: Bernarda Caffey, MD;  Location: [facility];  Service: Ophthalmology;  Laterality: Bilateral;   PHOTOCOAGULATION WITH LASER Left 07/20/2018   Procedure: Photocoagulation With Laser LEFT EYE;  Surgeon: Bernarda Caffey, MD;  Location: [facility];  Service: Ophthalmology;  Laterality: Left;   PHOTOCOAGULATION WITH LASER Left 07/19/2019   Procedure: PHOTOCOAGULATION WITH LASER;  Surgeon: Bernarda Caffey, MD;  Location: [place] [facility];  Service: Ophthalmology;  Laterality: Left;   RETINAL DETACHMENT SURGERY Left 07/20/2018   Dr. Bernarda Caffey   SILICON OIL REMOVAL Left 07/19/2019   Procedure: 25g PARS PLANA VITRECTOMY WITH SILICON OIL REMOVAL;  Surgeon: Bernarda Caffey, MD;  Location: [facility];  Service: Ophthalmology;  Laterality: Left;   TEE WITHOUT CARDIOVERSION  N/A 07/08/2021   Procedure: TRANSESOPHAGEAL ECHOCARDIOGRAM (TEE);  Surgeon: Donato Heinz, MD;  Location[facility];  Service: Cardiovascular;  Laterality: N/A;   WISDOM TOOTH EXTRACTION     Social History:  reports that she has never smoked. She has never used smokeless tobacco. She reports that she does not currently use alcohol. She reports that she does not use drugs. Family History:  Family History  Problem Relation Age of Onset   Diabetes Mother    Aneurysm Mother 10   Seizures Mother    Diabetes Father    Cataracts Father    COPD Father    Heart attack Father    Heart disease Father    Healthy Sister    Healthy Daughter    Stroke Maternal Grandfather    Amblyopia Neg Hx    Blindness Neg Hx    Glaucoma Neg Hx    Macular degeneration Neg Hx    Retinal detachment Neg Hx    Strabismus Neg Hx    Retinitis pigmentosa  Neg Hx    Colon cancer Neg Hx    Stomach cancer Neg Hx    Esophageal cancer Neg Hx    Pancreatic cancer Neg Hx    Liver disease Neg Hx      HOME MEDICATIONS: Allergies as of 10/13/2021   No Known Allergies      Medication List        Accurate as of October 13, 2021 10:54 AM. If you have any questions, ask your nurse or doctor.          amLODipine 10 MG tablet Commonly known as: NORVASC Take 1 tablet (10 mg total) by mouth daily. What changed: when to take this   calcitRIOL 0.5 MCG capsule Commonly known as: ROCALTROL Take 0.5 mcg by mouth every Monday, Wednesday, and Friday with hemodialysis.   carvedilol 25 MG tablet Commonly known as: COREG Take 1 tablet (25 mg total) by mouth 2 (two) times daily.   Continuous Blood Gluc Receiver Devi 1 Units by Does not apply route 4 (four) times daily -  before meals and at bedtime. May substitute for cheapest monitor with insurance   Continuous Blood Gluc Sensor Misc 1 each by Does not apply route as directed. Use as directed every 14 days. May dispense FreeStyle [employer] or similar.   Continuous Glucose Monitor Sup Misc 1 Units by Does not apply route 4 (four) times daily -  before meals and at bedtime. May substitute for cheapest option with patient insurance   ferrous sulfate 325 (65 FE) MG tablet Take 325 mg by mouth daily.   FLUoxetine 40 MG capsule Commonly known as: PROZAC Take 40 mg by mouth every morning.   FREESTYLE TEST STRIPS test strip Generic drug: glucose blood Use as instructed   hydrALAZINE 50 MG tablet Commonly known as: APRESOLINE Take 1 tablet by mouth twice a day, may take 1 extra tablet by mouth daily only as needed for elevated blood pressure What changed:  how much to take how to take this when to take this additional instructions   HYDROcodone-acetaminophen 5-325 MG tablet Commonly known as: Norco Take 1 tablet by mouth every 6 (six) hours as needed for moderate pain.    insulin lispro 100 UNIT/ML KwikPen Commonly known as: HumaLOG KwikPen 3 times a day (just before each meal) 4-3-3 units, and pen needles 4/day What changed:  how much to take how to take this when to take this additional instructions   Lantus  SoloStar 100 UNIT/ML Solostar Pen Generic drug: insulin glargine Inject 20 Units into the skin every morning.   metoCLOPramide 5 MG tablet Commonly known as: REGLAN Take 1 tablet (5 mg total) by mouth 3 (three) times daily before meals. What changed:  when to take this reasons to take this   multivitamin Tabs tablet Take 1 tablet by mouth at bedtime.   ondansetron 8 MG disintegrating tablet Commonly known as: ZOFRAN-ODT Take 1 tablet (8 mg total) by mouth every 8 (eight) hours as needed for nausea or vomiting.   sertraline 50 MG tablet Commonly known as: ZOLOFT Take 50 mg by mouth every morning.   sevelamer carbonate 800 MG tablet Commonly known as: RENVELA Take 1 tablet (800 mg total) by mouth 3 (three) times daily with meals.         OBJECTIVE:   Vital Signs: BP (!) 144/90 (BP Location: Left Arm, Patient Position: Sitting, Cuff Size: Small)   Pulse 75   Ht 5\' 6"  (1.676 m)   Wt 146 lb (66.2 kg)   BMI 23.57 kg/m   Wt Readings from Last 3 Encounters:  10/13/21 146 lb (66.2 kg)  09/11/21 140 lb (63.5 kg)  08/11/21 143 lb (64.9 kg)     Exam: General: Pt appears well and is in NAD  Neck: General: Supple without adenopathy. Thyroid: Thyroid size normal.  No goiter or nodules appreciated.   Lungs: Clear with good BS bilat   Heart: RRR   Abdomen:  soft, nontender  Extremities: No pretibial edema.   Skin: Normal texture and temperature to palpation. No rash noted. No Acanthosis nigricans/skin tags.   Neuro: MS is good with appropriate affect, pt is alert and Ox3    DM foot exam: 10/13/2021  The skin of the feet is intact without sores or ulcerations. The pedal pulses are 2+ on right and 2+ on left. The sensation is  intact to a screening 5.07, 10 gram monofilament bilaterally        DATA REVIEWED:  Lab Results  Component Value Date   HGBA1C 7.7 (A) 10/13/2021   HGBA1C 7.1 (A) 07/23/2021   HGBA1C 7.6 (H) 03/25/2021    Latest Reference Range & Units 08/11/21 08:13  Sodium 135 - 145 mmol/L 137  Potassium 3.5 - 5.1 mmol/L 4.2  Chloride 98 - 111 mmol/L 105  Glucose 70 - 99 mg/dL 204 (H)  BUN 6 - 20 mg/dL 34 (H)  Creatinine 0.44 - 1.00 mg/dL 7.50 (H)  Calcium Ionized 1.15 - 1.40 mmol/L 1.16      ASSESSMENT / PLAN / RECOMMENDATIONS:   1) Type 1 Diabetes Mellitus, Sub-Optimally controlled, With retinopathic and ESRD on HD complications - Most recent A1c of 7.7 %. Goal A1c < 7.0 %.    -Her A1c is above goal but has been trending down -Freestyle libre sensor was costing her $75 a month which was cost prohibitive for the patient. - Dexcom G7 prescription will be faxed to DME  -Patient noted with hypoglycemia when she woke up this morning, she did correct appropriately with an in office BG reading of 178 mg/DL -I will reduce her basal insulin as below, I will also adjust her prandial dose of insulin and provide her with a correction scale as below   MEDICATIONS: Decrease Lantus 16 units daily Humalog 4 units 3 times daily before every meal CF: Humalog (BG-120/50)   EDUCATION / INSTRUCTIONS: BG monitoring instructions: Patient is instructed to check her blood sugars 3 times a day, before meals.  Call [facility] if: BG persistently < 70  I reviewed the Rule of 15 for the treatment of hypoglycemia in detail with the patient. Literature supplied.    2) Diabetic complications:  Eye: Does  have known diabetic retinopathy.  Neuro/ Feet: Does not have known diabetic peripheral neuropathy .  Renal: Patient does  have known baseline CKD. She   is not on an ACEI/ARB at present.      F/U in 4 months     Signed electronically by: Mack Guise, MD  [facility]  Endocrinology  [facility] [address] [place], [place] [address] Phone: [phone] FAX: [phone]   CC: Timothy Lasso, MD (Inactive) No address on file Phone: None  Fax: None  Return to Endocrinology clinic as below: No future appointments.

## 2021-10-13 NOTE — Patient Instructions (Signed)
Decrease Lantus 16  units daily  Humalog 4 units with each meal  Humalog correctional insulin: ADD extra units on insulin to your meal-time Humalog  dose if your blood sugars are higher than 170. Use the scale below to help guide you:   Blood sugar before meal Number of units to inject  Less than 170 0 unit  171 -  220 1 units  221 -  270 2 units  271 -  320 3 units  321 -  370 4 units  371 -  420 5 units  421 -  470 6 units     HOW TO TREAT LOW BLOOD SUGARS (Blood sugar LESS THAN 70 MG/DL) Please follow the RULE OF 15 for the treatment of hypoglycemia treatment (when your (blood sugars are less than 70 mg/dL)   STEP 1: Take 15 grams of carbohydrates when your blood sugar is low, which includes:  3-4 GLUCOSE TABS  OR 3-4 OZ OF JUICE OR REGULAR SODA OR ONE TUBE OF GLUCOSE GEL    STEP 2: RECHECK blood sugar in 15 MINUTES STEP 3: If your blood sugar is still low at the 15 minute recheck --> then, go back to STEP 1 and treat AGAIN with another 15 grams of carbohydrates.

## 2021-10-21 ENCOUNTER — Other Ambulatory Visit (HOSPITAL_COMMUNITY): Payer: Self-pay

## 2021-12-05 ENCOUNTER — Other Ambulatory Visit: Payer: Self-pay

## 2021-12-05 ENCOUNTER — Encounter (HOSPITAL_COMMUNITY): Payer: Self-pay

## 2021-12-05 ENCOUNTER — Emergency Department (HOSPITAL_COMMUNITY)
Admission: EM | Admit: 2021-12-05 | Discharge: 2021-12-05 | Disposition: A | Discharge status: Home / Self Care | Payer: 59 | Attending: Emergency Medicine | Admitting: Emergency Medicine

## 2021-12-05 DIAGNOSIS — Z794 Long term (current) use of insulin: Secondary | ICD-10-CM | POA: Diagnosis not present

## 2021-12-05 DIAGNOSIS — E1143 Type 2 diabetes mellitus with diabetic autonomic (poly)neuropathy: Secondary | ICD-10-CM | POA: Insufficient documentation

## 2021-12-05 DIAGNOSIS — E1122 Type 2 diabetes mellitus with diabetic chronic kidney disease: Secondary | ICD-10-CM | POA: Diagnosis not present

## 2021-12-05 DIAGNOSIS — Z992 Dependence on renal dialysis: Secondary | ICD-10-CM | POA: Diagnosis not present

## 2021-12-05 DIAGNOSIS — Z79899 Other long term (current) drug therapy: Secondary | ICD-10-CM | POA: Insufficient documentation

## 2021-12-05 DIAGNOSIS — N186 End stage renal disease: Secondary | ICD-10-CM | POA: Diagnosis not present

## 2021-12-05 DIAGNOSIS — K3184 Gastroparesis: Secondary | ICD-10-CM

## 2021-12-05 DIAGNOSIS — R112 Nausea with vomiting, unspecified: Secondary | ICD-10-CM | POA: Diagnosis present

## 2021-12-05 LAB — COMPREHENSIVE METABOLIC PANEL
ALT: 18 U/L (ref 0–44)
AST: 21 U/L (ref 15–41)
Albumin: 3.3 g/dL — ABNORMAL LOW (ref 3.5–5.0)
Alkaline Phosphatase: 79 U/L (ref 38–126)
Anion gap: 15 (ref 5–15)
BUN: 38 mg/dL — ABNORMAL HIGH (ref 6–20)
CO2: 30 mmol/L (ref 22–32)
Calcium: 9.1 mg/dL (ref 8.9–10.3)
Chloride: 96 mmol/L — ABNORMAL LOW (ref 98–111)
Creatinine, Ser: 8.73 mg/dL — ABNORMAL HIGH (ref 0.44–1.00)
GFR, Estimated: 6 mL/min — ABNORMAL LOW (ref >60)
Glucose, Bld: 148 mg/dL — ABNORMAL HIGH (ref 70–99)
Potassium: 4 mmol/L (ref 3.5–5.1)
Sodium: 141 mmol/L (ref 135–145)
Total Bilirubin: 0.6 mg/dL (ref 0.3–1.2)
Total Protein: 6.5 g/dL (ref 6.5–8.1)

## 2021-12-05 LAB — CBC WITH DIFFERENTIAL/PLATELET
Abs Immature Granulocytes: 0.02 10*3/uL (ref 0.00–0.07)
Basophils Absolute: 0 10*3/uL (ref 0.0–0.1)
Basophils Relative: 1 %
Eosinophils Absolute: 0 10*3/uL (ref 0.0–0.5)
Eosinophils Relative: 0 %
HCT: 37.4 % (ref 36.0–46.0)
Hemoglobin: 12 g/dL (ref 12.0–15.0)
Immature Granulocytes: 0 %
Lymphocytes Relative: 13 %
Lymphs Abs: 0.8 10*3/uL (ref 0.7–4.0)
MCH: 28 pg (ref 26.0–34.0)
MCHC: 32.1 g/dL (ref 30.0–36.0)
MCV: 87.4 fL (ref 80.0–100.0)
Monocytes Absolute: 0.2 10*3/uL (ref 0.1–1.0)
Monocytes Relative: 4 %
Neutro Abs: 5 10*3/uL (ref 1.7–7.7)
Neutrophils Relative %: 82 %
Platelets: 315 10*3/uL (ref 150–400)
RBC: 4.28 MIL/uL (ref 3.87–5.11)
RDW: 18.2 % — ABNORMAL HIGH (ref 11.5–15.5)
WBC: 6.1 10*3/uL (ref 4.0–10.5)
nRBC: 0 % (ref 0.0–0.2)

## 2021-12-05 LAB — LIPASE, BLOOD: Lipase: 38 U/L (ref 11–51)

## 2021-12-05 LAB — MAGNESIUM: Magnesium: 2.2 mg/dL (ref 1.7–2.4)

## 2021-12-05 MED ORDER — METOCLOPRAMIDE HCL 5 MG/ML IJ SOLN
10.0000 mg | Freq: Once | INTRAMUSCULAR | Status: AC
  Administered 2021-12-05: 10 mg via INTRAVENOUS
  Filled 2021-12-05: qty 2

## 2021-12-05 MED ORDER — DIPHENHYDRAMINE HCL 25 MG PO TABS: 25.0000 mg | ORAL_TABLET | Freq: Four times a day (QID) | ORAL | 0 refills | Status: DC | PRN

## 2021-12-05 MED ORDER — SODIUM CHLORIDE 0.9 % IV BOLUS
500.0000 mL | Freq: Once | INTRAVENOUS | Status: AC
  Administered 2021-12-05: 500 mL via INTRAVENOUS

## 2021-12-05 MED ORDER — METOCLOPRAMIDE HCL 10 MG PO TABS: 10.0000 mg | ORAL_TABLET | Freq: Four times a day (QID) | ORAL | 0 refills | Status: DC

## 2021-12-05 MED ORDER — FAMOTIDINE IN NACL 20-0.9 MG/50ML-% IV SOLN
20.0000 mg | Freq: Once | INTRAVENOUS | Status: AC
  Administered 2021-12-05: 20 mg via INTRAVENOUS
  Filled 2021-12-05: qty 50

## 2021-12-05 MED ORDER — ONDANSETRON HCL 4 MG/2ML IJ SOLN
4.0000 mg | Freq: Once | INTRAMUSCULAR | Status: AC
  Administered 2021-12-05: 4 mg via INTRAVENOUS
  Filled 2021-12-05: qty 2

## 2021-12-05 MED ORDER — DIPHENHYDRAMINE HCL 50 MG/ML IJ SOLN
25.0000 mg | Freq: Once | INTRAMUSCULAR | Status: AC
  Administered 2021-12-05: 25 mg via INTRAVENOUS
  Filled 2021-12-05: qty 1

## 2021-12-05 NOTE — ED Triage Notes (Signed)
Patient called EMS for n/v that started this morning, denies anyone else being sick at home.  Pt is a dialysis patient, hasn't missed any treatments.

## 2021-12-05 NOTE — Discharge Instructions (Signed)
You had no improvement of symptoms with Zofran.  You did however have improvement with Reglan and Benadryl.  Prescription for Reglan has been sent to your pharmacy to help with symptoms at the house.  It is recommended to take Benadryl when you take Reglan due to Reglan's common side effect of akathisia (feelings of restlessness).  Follow-up with your primary care physician and GI specialist if symptoms do not improve in the next 3 to 5 days.

## 2021-12-05 NOTE — ED Provider Notes (Signed)
[facility] EMERGENCY DEPARTMENT Provider Note   CSN: [id number] Arrival date & time: 12/05/21  1524     History  Chief Complaint  Patient presents with   Nausea    Vomiting     Barbara Donovan is a 31 y.o. female.  Patient is a 31 year old female with past medical history of diabetes, end-stage renal disease on dialysis, and gastroparesis presenting for complaints of vomiting.  Patient admits to nausea, vomiting, generalized abdominal complaints.  States her symptoms started this morning.  Admits to over 7 episodes of nonbloody emesis prior to arrival.  Any fevers or chills.  Denies sick contacts.  The history is provided by the patient. No language interpreter was used.       Home Medications Prior to Admission medications   Medication Sig Start Date End Date Taking? Authorizing Provider  diphenhydrAMINE (BENADRYL) 25 MG tablet Take 1 tablet (25 mg total) by mouth every 6 (six) hours as needed. 63/01/60  Yes Campbell Stall P, DO  metoCLOPramide (REGLAN) 10 MG tablet Take 1 tablet (10 mg total) by mouth every 6 (six) hours. 10/93/23  Yes Campbell Stall P, DO  amLODipine (NORVASC) 10 MG tablet Take 1 tablet (10 mg total) by mouth daily. Patient taking differently: Take 10 mg by mouth every evening. 03/09/20   Mercy Riding, MD  calcitRIOL (ROCALTROL) 0.5 MCG capsule Take 0.5 mcg by mouth every Monday, Wednesday, and Friday with hemodialysis. 12/02/20   [provider]  carvedilol (COREG) 25 MG tablet Take 1 tablet (25 mg total) by mouth 2 (two) times daily. 03/07/19   Imogene Burn, PA-C  Continuous Blood Gluc Receiver DEVI 1 Units by Does not apply route 4 (four) times daily -  before meals and at bedtime. May substitute for cheapest monitor with insurance 03/27/21   Scarlett Presto, MD  Continuous Blood Gluc Sensor (DEXCOM G7 SENSOR) MISC 1 Device by Does not apply route as directed. 10/13/21   Shamleffer, Melanie Crazier, MD  Continuous Glucose Monitor  Sup MISC 1 Units by Does not apply route 4 (four) times daily -  before meals and at bedtime. May substitute for cheapest option with patient insurance 03/27/21 03/27/22  Scarlett Presto, MD  ferrous sulfate 325 (65 FE) MG tablet Take 325 mg by mouth daily. 05/12/21   [provider]  FLUoxetine (PROZAC) 40 MG capsule Take 40 mg by mouth every morning. 05/15/21   [provider]  glucose blood (FREESTYLE TEST STRIPS) test strip Use as instructed 02/25/21   Terrilee Croak, MD  hydrALAZINE (APRESOLINE) 50 MG tablet Take 1 tablet by mouth twice a day, may take 1 extra tablet by mouth daily only as needed for elevated blood pressure Patient taking differently: Take 50 mg by mouth 3 (three) times daily. 04/28/21   Imogene Burn, PA-C  HYDROcodone-acetaminophen (NORCO) 5-325 MG tablet Take 1 tablet by mouth every 6 (six) hours as needed for moderate pain. Patient not taking: Reported on 10/13/2021 08/11/21   Dagoberto Ligas, PA-C  insulin glargine (LANTUS SOLOSTAR) 100 UNIT/ML Solostar Pen Inject 16 Units into the skin every morning. 10/13/21   Shamleffer, Melanie Crazier, MD  insulin lispro (HUMALOG KWIKPEN) 100 UNIT/ML KwikPen Max daily 35 units 10/13/21   Shamleffer, Melanie Crazier, MD  Insulin Pen Needle 32G X 4 MM MISC 1 Device by Does not apply route in the morning, at noon, in the evening, and at bedtime. 10/13/21   Shamleffer, Melanie Crazier, MD  multivitamin (RENA-VIT) TABS tablet Take  1 tablet by mouth at bedtime. 03/08/20   Mercy Riding, MD  ondansetron (ZOFRAN-ODT) 8 MG disintegrating tablet Take 1 tablet (8 mg total) by mouth every 8 (eight) hours as needed for nausea or vomiting. 8/46/65   Delora Fuel, MD  sertraline (ZOLOFT) 50 MG tablet Take 50 mg by mouth every morning. 09/07/21   [provider]  sevelamer carbonate (RENVELA) 800 MG tablet Take 1 tablet (800 mg total) by mouth 3 (three) times daily with meals. 02/05/21   Bonnielee Haff, MD      Allergies    Patient has  no known allergies.    Review of Systems   Review of Systems  Constitutional:  Negative for chills and fever.  HENT:  Negative for ear pain and sore throat.   Eyes:  Negative for pain and visual disturbance.  Respiratory:  Negative for cough and shortness of breath.   Cardiovascular:  Negative for chest pain and palpitations.  Gastrointestinal:  Positive for abdominal pain, nausea and vomiting.  Genitourinary:  Negative for dysuria and hematuria.  Musculoskeletal:  Negative for arthralgias and back pain.  Skin:  Negative for color change and rash.  Neurological:  Negative for seizures and syncope.  All other systems reviewed and are negative.   Physical Exam Updated Vital Signs BP (!) 178/102 (BP Location: Right Arm)   Pulse 95   Temp 98.9 F (37.2 C)   Resp 14   SpO2 100%  Physical Exam Vitals and nursing note reviewed.  Constitutional:      General: She is not in acute distress.    Appearance: She is well-developed.  HENT:     Head: Normocephalic and atraumatic.  Eyes:     Conjunctiva/sclera: Conjunctivae normal.  Cardiovascular:     Rate and Rhythm: Normal rate and regular rhythm.     Heart sounds: No murmur heard. Pulmonary:     Effort: Pulmonary effort is normal. No respiratory distress.     Breath sounds: Normal breath sounds.  Abdominal:     Palpations: Abdomen is soft.     Tenderness: There is no abdominal tenderness.     Comments: Patient actively vomiting on exam.  Nonbloody nonbilious emesis.  Abdomen is soft and nontender.  Musculoskeletal:        General: No swelling.     Cervical back: Neck supple.  Skin:    General: Skin is warm and dry.     Capillary Refill: Capillary refill takes less than 2 seconds.  Neurological:     Mental Status: She is alert.  Psychiatric:        Mood and Affect: Mood normal.     ED Results / Procedures / Treatments   Labs (all labs ordered are listed, but only abnormal results are displayed) Labs Reviewed  CBC WITH  DIFFERENTIAL/PLATELET - Abnormal; Notable for the following components:      Result Value   RDW 18.2 (*)    All other components within normal limits  COMPREHENSIVE METABOLIC PANEL  LIPASE, BLOOD  MAGNESIUM    EKG None  Radiology No results found.  Procedures Procedures    Medications Ordered in ED Medications  metoCLOPramide (REGLAN) injection 10 mg (10 mg Intravenous Given 12/05/21 1643)  diphenhydrAMINE (BENADRYL) injection 25 mg (25 mg Intravenous Given 12/05/21 1645)  famotidine (PEPCID) IVPB 20 mg premix (0 mg Intravenous Stopped 12/05/21 1741)  sodium chloride 0.9 % bolus 500 mL (0 mLs Intravenous Stopped 12/05/21 1937)  ondansetron (ZOFRAN) injection 4 mg (4 mg Intravenous  Given 12/05/21 1741)    ED Course/ Medical Decision Making/ A&P                           Medical Decision Making Amount and/or Complexity of Data Reviewed Labs: ordered.  Risk Prescription drug management.   11:82 PM 31 year old female with past medical history of diabetes, end-stage renal disease on dialysis, and gastroparesis presenting for complaints of vomiting.  Is alert and oriented x3, no acute distress, afebrile, stable vital signs.  Physical exam demonstrates soft nontender abdomen.  No guarding or rigidity.  Patient actively vomiting on exam.  Nonbloody nonbilious emesis.    Patient states she had no improvement of symptoms after Zofran given by EMS.  Reglan and Benadryl ordered.  9:15 PM Had significant improvement of symptoms after Reglan and Benadryl.  Patient is a dialysis patient so I will tread lightly with IV fluids.  500 cc bolus given.  Reevaluation patient admits to significant improvement and no vomiting since arrival.  This is something has happened with the patient's laboratory studies where they have been delayed multiple times.  Patient's nurses contacted the lab who states the labs have been hemolyzed twice already.  Repeat labs are being sent at this time just to check  for electrolyte abnormalities due to patient's repetitive vomiting nature today.  Also wanted to just make sure that her liver profile, lipase, and renal function were stable along with glucose history of diabetes.  Patient signed out to oncoming provider while awaiting laboratory studies with plans for DC home as long as labs are stable.        Final Clinical Impression(s) / ED Diagnoses Final diagnoses:  Gastroparesis    Rx / DC Orders ED Discharge Orders          Ordered    metoCLOPramide (REGLAN) 10 MG tablet  Every 6 hours        12/05/21 2113    diphenhydrAMINE (BENADRYL) 25 MG tablet  Every 6 hours PRN        12/05/21 2113              Lianne Cure, DO 28/31/51 2115

## 2021-12-05 NOTE — ED Provider Notes (Signed)
9:21 PM Care assumed with Dr. Pearline Cables.  At time of transfer of care, patient is waiting for results of labs.  They are reassuring, plan of care is to discharge home as they previously discussed for recurrent nausea and vomiting.  Patient is reportedly feeling better and wants to go home but will wait on the labs.  Anticipate discharge.  There are no critical electrolyte abnormalities seen and kidney function was elevated as expected.  Patient continues to feel well and agrees with discharge home.  Will discharge per previous plan.  Clinical Impression: 1. Gastroparesis     Disposition: Discharge  Condition: Good  I have discussed the results, Dx and Tx plan with the pt(& family if present). He/she/they expressed understanding and agree(s) with the plan. Discharge instructions discussed at great length. Strict return precautions discussed and pt &/or family have verbalized understanding of the instructions. No further questions at time of discharge.    New Prescriptions   DIPHENHYDRAMINE (BENADRYL) 25 MG TABLET    Take 1 tablet (25 mg total) by mouth every 6 (six) hours as needed.   METOCLOPRAMIDE (REGLAN) 10 MG TABLET    Take 1 tablet (10 mg total) by mouth every 6 (six) hours.    Follow Up: [place] [address] [id number] mc [place] [phone]  Return to emergency department immediately for any worsening concerning signs or symptoms     Sovereign Ramiro, Gwenyth Allegra, MD 12/05/21 2155

## 2022-02-23 ENCOUNTER — Telehealth: Payer: Self-pay

## 2022-02-23 NOTE — Telephone Encounter (Signed)
Pt stopped by to pick up sample of the Dexcom G7 sensors. States she spoke with someone about her sensor malfunctioning and requested a replacement.

## 2022-02-25 ENCOUNTER — Encounter: Payer: Self-pay | Admitting: Internal Medicine

## 2022-02-25 ENCOUNTER — Ambulatory Visit (INDEPENDENT_AMBULATORY_CARE_PROVIDER_SITE_OTHER): Payer: 59 | Admitting: Internal Medicine

## 2022-02-25 VITALS — BP 120/80 | HR 81 | Ht 66.0 in | Wt 146.0 lb

## 2022-02-25 DIAGNOSIS — E1022 Type 1 diabetes mellitus with diabetic chronic kidney disease: Secondary | ICD-10-CM

## 2022-02-25 DIAGNOSIS — E10319 Type 1 diabetes mellitus with unspecified diabetic retinopathy without macular edema: Secondary | ICD-10-CM | POA: Diagnosis not present

## 2022-02-25 DIAGNOSIS — N186 End stage renal disease: Secondary | ICD-10-CM | POA: Diagnosis not present

## 2022-02-25 DIAGNOSIS — Z992 Dependence on renal dialysis: Secondary | ICD-10-CM

## 2022-02-25 DIAGNOSIS — E1065 Type 1 diabetes mellitus with hyperglycemia: Secondary | ICD-10-CM

## 2022-02-25 LAB — POCT GLYCOSYLATED HEMOGLOBIN (HGB A1C): Hemoglobin A1C: 6.4 % — AB (ref 4.0–5.6)

## 2022-02-25 MED ORDER — LANTUS SOLOSTAR 100 UNIT/ML ~~LOC~~ SOPN: 18.0000 [IU] | PEN_INJECTOR | SUBCUTANEOUS | 6 refills | Status: DC

## 2022-02-25 MED ORDER — INSULIN LISPRO (1 UNIT DIAL) 100 UNIT/ML (KWIKPEN): PEN_INJECTOR | SUBCUTANEOUS | 3 refills | Status: DC

## 2022-02-25 MED ORDER — INSULIN PEN NEEDLE 32G X 4 MM MISC: 1.0000 | Freq: Four times a day (QID) | 3 refills | Status: DC
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
## 2022-02-25 NOTE — Patient Instructions (Signed)
Increase  Lantus 18  units daily  Increase Humalog 6 units with each meal  Humalog correctional insulin: ADD extra units on insulin to your meal-time Humalog  dose if your blood sugars are higher than 170. Use the scale below to help guide you:   Blood sugar before meal Number of units to inject  Less than 170 0 unit  171 -  220 1 units  221 -  270 2 units  271 -  320 3 units  321 -  370 4 units  371 -  420 5 units  421 -  470 6 units     HOW TO TREAT LOW BLOOD SUGARS (Blood sugar LESS THAN 70 MG/DL) Please follow the RULE OF 15 for the treatment of hypoglycemia treatment (when your (blood sugars are less than 70 mg/dL)   STEP 1: Take 15 grams of carbohydrates when your blood sugar is low, which includes:  3-4 GLUCOSE TABS  OR 3-4 OZ OF JUICE OR REGULAR SODA OR ONE TUBE OF GLUCOSE GEL    STEP 2: RECHECK blood sugar in 15 MINUTES STEP 3: If your blood sugar is still low at the 15 minute recheck --> then, go back to STEP 1 and treat AGAIN with another 15 grams of carbohydrates.

## 2022-06-11 ENCOUNTER — Encounter: Payer: 59 | Admitting: Obstetrics and Gynecology

## 2022-06-22 ENCOUNTER — Inpatient Hospital Stay (HOSPITAL_COMMUNITY)
Admission: EM | Admit: 2022-06-22 | Discharge: 2022-06-29 | DRG: 252 | Disposition: A | Discharge status: Home Health | Payer: 59 | Attending: Internal Medicine | Admitting: Internal Medicine

## 2022-06-22 ENCOUNTER — Encounter (HOSPITAL_COMMUNITY)
Admission: EM | Disposition: A | Discharge status: Home Health | Payer: Self-pay | Source: Home / Self Care | Attending: Internal Medicine

## 2022-06-22 ENCOUNTER — Other Ambulatory Visit: Payer: Self-pay

## 2022-06-22 ENCOUNTER — Emergency Department (HOSPITAL_COMMUNITY): Payer: 59

## 2022-06-22 ENCOUNTER — Emergency Department (HOSPITAL_COMMUNITY): Payer: 59 | Admitting: Certified Registered"

## 2022-06-22 ENCOUNTER — Encounter (HOSPITAL_COMMUNITY): Payer: Self-pay | Admitting: Emergency Medicine

## 2022-06-22 DIAGNOSIS — Z825 Family history of asthma and other chronic lower respiratory diseases: Secondary | ICD-10-CM

## 2022-06-22 DIAGNOSIS — Z833 Family history of diabetes mellitus: Secondary | ICD-10-CM

## 2022-06-22 DIAGNOSIS — E872 Acidosis, unspecified: Secondary | ICD-10-CM | POA: Diagnosis present

## 2022-06-22 DIAGNOSIS — T827XXA Infection and inflammatory reaction due to other cardiac and vascular devices, implants and grafts, initial encounter: Principal | ICD-10-CM | POA: Diagnosis present

## 2022-06-22 DIAGNOSIS — Z794 Long term (current) use of insulin: Secondary | ICD-10-CM

## 2022-06-22 DIAGNOSIS — I071 Rheumatic tricuspid insufficiency: Secondary | ICD-10-CM | POA: Diagnosis present

## 2022-06-22 DIAGNOSIS — E8770 Fluid overload, unspecified: Secondary | ICD-10-CM | POA: Diagnosis present

## 2022-06-22 DIAGNOSIS — Y832 Surgical operation with anastomosis, bypass or graft as the cause of abnormal reaction of the patient, or of later complication, without mention of misadventure at the time of the procedure: Secondary | ICD-10-CM | POA: Diagnosis present

## 2022-06-22 DIAGNOSIS — E875 Hyperkalemia: Secondary | ICD-10-CM | POA: Diagnosis present

## 2022-06-22 DIAGNOSIS — E1022 Type 1 diabetes mellitus with diabetic chronic kidney disease: Secondary | ICD-10-CM | POA: Diagnosis present

## 2022-06-22 DIAGNOSIS — N186 End stage renal disease: Secondary | ICD-10-CM

## 2022-06-22 DIAGNOSIS — E871 Hypo-osmolality and hyponatremia: Secondary | ICD-10-CM | POA: Diagnosis present

## 2022-06-22 DIAGNOSIS — E10319 Type 1 diabetes mellitus with unspecified diabetic retinopathy without macular edema: Secondary | ICD-10-CM | POA: Diagnosis present

## 2022-06-22 DIAGNOSIS — I132 Hypertensive heart and chronic kidney disease with heart failure and with stage 5 chronic kidney disease, or end stage renal disease: Secondary | ICD-10-CM | POA: Diagnosis not present

## 2022-06-22 DIAGNOSIS — Z79899 Other long term (current) drug therapy: Secondary | ICD-10-CM

## 2022-06-22 DIAGNOSIS — I120 Hypertensive chronic kidney disease with stage 5 chronic kidney disease or end stage renal disease: Secondary | ICD-10-CM | POA: Diagnosis present

## 2022-06-22 DIAGNOSIS — R7881 Bacteremia: Secondary | ICD-10-CM | POA: Diagnosis present

## 2022-06-22 DIAGNOSIS — D631 Anemia in chronic kidney disease: Secondary | ICD-10-CM | POA: Diagnosis present

## 2022-06-22 DIAGNOSIS — M542 Cervicalgia: Secondary | ICD-10-CM | POA: Diagnosis not present

## 2022-06-22 DIAGNOSIS — Z992 Dependence on renal dialysis: Secondary | ICD-10-CM | POA: Diagnosis not present

## 2022-06-22 DIAGNOSIS — Z8249 Family history of ischemic heart disease and other diseases of the circulatory system: Secondary | ICD-10-CM

## 2022-06-22 DIAGNOSIS — N2581 Secondary hyperparathyroidism of renal origin: Secondary | ICD-10-CM | POA: Diagnosis present

## 2022-06-22 DIAGNOSIS — A4101 Sepsis due to Methicillin susceptible Staphylococcus aureus: Secondary | ICD-10-CM | POA: Diagnosis present

## 2022-06-22 DIAGNOSIS — R911 Solitary pulmonary nodule: Secondary | ICD-10-CM | POA: Diagnosis present

## 2022-06-22 DIAGNOSIS — M79602 Pain in left arm: Secondary | ICD-10-CM | POA: Diagnosis not present

## 2022-06-22 DIAGNOSIS — Z91158 Patient's noncompliance with renal dialysis for other reason: Secondary | ICD-10-CM

## 2022-06-22 DIAGNOSIS — I509 Heart failure, unspecified: Secondary | ICD-10-CM

## 2022-06-22 DIAGNOSIS — Z823 Family history of stroke: Secondary | ICD-10-CM

## 2022-06-22 DIAGNOSIS — A419 Sepsis, unspecified organism: Principal | ICD-10-CM

## 2022-06-22 HISTORY — PX: INSERTION OF DIALYSIS CATHETER: SHX1324

## 2022-06-22 HISTORY — PX: AVGG REMOVAL: SHX5153

## 2022-06-22 LAB — BLOOD CULTURE ID PANEL (REFLEXED) - BCID2

## 2022-06-22 LAB — COMPREHENSIVE METABOLIC PANEL
ALT: 33 U/L (ref 0–44)
AST: 20 U/L (ref 15–41)
Albumin: 3.2 g/dL — ABNORMAL LOW (ref 3.5–5.0)
Alkaline Phosphatase: 99 U/L (ref 38–126)
Anion gap: 26 — ABNORMAL HIGH (ref 5–15)
BUN: 135 mg/dL — ABNORMAL HIGH (ref 6–20)
CO2: 16 mmol/L — ABNORMAL LOW (ref 22–32)
Calcium: 9.3 mg/dL (ref 8.9–10.3)
Chloride: 85 mmol/L — ABNORMAL LOW (ref 98–111)
Creatinine, Ser: 19.02 mg/dL — ABNORMAL HIGH (ref 0.44–1.00)
GFR, Estimated: 2 mL/min — ABNORMAL LOW (ref >60)
Glucose, Bld: 132 mg/dL — ABNORMAL HIGH (ref 70–99)
Potassium: 5.7 mmol/L — ABNORMAL HIGH (ref 3.5–5.1)
Sodium: 127 mmol/L — ABNORMAL LOW (ref 135–145)
Total Bilirubin: 0.8 mg/dL (ref 0.3–1.2)
Total Protein: 7.2 g/dL (ref 6.5–8.1)

## 2022-06-22 LAB — CBC WITH DIFFERENTIAL/PLATELET
Abs Immature Granulocytes: 0.07 10*3/uL (ref 0.00–0.07)
Basophils Absolute: 0 10*3/uL (ref 0.0–0.1)
Basophils Relative: 0 %
Eosinophils Absolute: 0 10*3/uL (ref 0.0–0.5)
Eosinophils Relative: 0 %
HCT: 26.7 % — ABNORMAL LOW (ref 36.0–46.0)
Hemoglobin: 8.8 g/dL — ABNORMAL LOW (ref 12.0–15.0)
Immature Granulocytes: 1 %
Lymphocytes Relative: 6 %
Lymphs Abs: 0.9 10*3/uL (ref 0.7–4.0)
MCH: 29.4 pg (ref 26.0–34.0)
MCHC: 33 g/dL (ref 30.0–36.0)
MCV: 89.3 fL (ref 80.0–100.0)
Monocytes Absolute: 2.2 10*3/uL — ABNORMAL HIGH (ref 0.1–1.0)
Monocytes Relative: 16 %
Neutro Abs: 10.7 10*3/uL — ABNORMAL HIGH (ref 1.7–7.7)
Neutrophils Relative %: 77 %
Platelets: 335 10*3/uL (ref 150–400)
RBC: 2.99 MIL/uL — ABNORMAL LOW (ref 3.87–5.11)
RDW: 18.3 % — ABNORMAL HIGH (ref 11.5–15.5)
WBC: 13.8 10*3/uL — ABNORMAL HIGH (ref 4.0–10.5)
nRBC: 0 % (ref 0.0–0.2)

## 2022-06-22 LAB — I-STAT BETA HCG BLOOD, ED (MC, WL, AP ONLY): I-stat hCG, quantitative: <5 m[IU]/mL (ref <5)

## 2022-06-22 LAB — PROTIME-INR
INR: 1.2 (ref 0.8–1.2)
Prothrombin Time: 15.4 seconds — ABNORMAL HIGH (ref 11.4–15.2)

## 2022-06-22 LAB — LACTIC ACID, PLASMA
Lactic Acid, Venous: 1.2 mmol/L (ref 0.5–1.9)
Lactic Acid, Venous: 0.7 mmol/L (ref 0.5–1.9)

## 2022-06-22 LAB — HEPATITIS B SURFACE ANTIGEN: Hepatitis B Surface Ag: NONREACTIVE

## 2022-06-22 LAB — CBG MONITORING, ED: Glucose-Capillary: 122 mg/dL — ABNORMAL HIGH (ref 70–99)

## 2022-06-22 LAB — GLUCOSE, CAPILLARY
Glucose-Capillary: 108 mg/dL — ABNORMAL HIGH (ref 70–99)
Glucose-Capillary: 116 mg/dL — ABNORMAL HIGH (ref 70–99)

## 2022-06-22 SURGERY — REMOVAL OF ARTERIOVENOUS GORETEX GRAFT (AVGG)
Anesthesia: General | Site: Neck

## 2022-06-22 MED ORDER — SODIUM BICARBONATE 8.4 % IV SOLN
INTRAVENOUS | Status: DC | PRN
  Administered 2022-06-22: 50 meq via INTRAVENOUS

## 2022-06-22 MED ORDER — ACETAMINOPHEN 500 MG PO TABS: 1000.0000 mg | ORAL_TABLET | Freq: Once | ORAL | Status: DC | PRN

## 2022-06-22 MED ORDER — INSULIN ASPART 100 UNIT/ML IJ SOLN
0.0000 [IU] | Freq: Three times a day (TID) | INTRAMUSCULAR | Status: DC
  Administered 2022-06-24: 3 [IU] via SUBCUTANEOUS
  Administered 2022-06-24 – 2022-06-26 (×3): 1 [IU] via SUBCUTANEOUS
  Administered 2022-06-26 – 2022-06-29 (×6): 2 [IU] via SUBCUTANEOUS
  Administered 2022-06-29: 1 [IU] via SUBCUTANEOUS

## 2022-06-22 MED ORDER — VANCOMYCIN VARIABLE DOSE PER UNSTABLE RENAL FUNCTION (PHARMACIST DOSING): Status: DC

## 2022-06-22 MED ORDER — PROPOFOL 10 MG/ML IV BOLUS
INTRAVENOUS | Status: DC | PRN
  Administered 2022-06-22: 120 mg via INTRAVENOUS
  Administered 2022-06-22: 40 mg via INTRAVENOUS

## 2022-06-22 MED ORDER — SODIUM CHLORIDE 0.9 % IV SOLN
1.0000 g | INTRAVENOUS | Status: DC
  Filled 2022-06-22: qty 10

## 2022-06-22 MED ORDER — PHENYLEPHRINE HCL-NACL 20-0.9 MG/250ML-% IV SOLN: INTRAVENOUS | Status: DC | PRN

## 2022-06-22 MED ORDER — HEPARIN SODIUM (PORCINE) 5000 UNIT/ML IJ SOLN
5000.0000 [IU] | Freq: Three times a day (TID) | INTRAMUSCULAR | Status: DC
  Administered 2022-06-22 – 2022-06-27 (×12): 5000 [IU] via SUBCUTANEOUS
  Filled 2022-06-22 (×12): qty 1

## 2022-06-22 MED ORDER — MIDAZOLAM HCL 2 MG/2ML IJ SOLN
INTRAMUSCULAR | Status: DC | PRN
  Administered 2022-06-22: 2 mg via INTRAVENOUS

## 2022-06-22 MED ORDER — INSULIN ASPART 100 UNIT/ML IJ SOLN: 0.0000 [IU] | INTRAMUSCULAR | Status: DC | PRN

## 2022-06-22 MED ORDER — ACETAMINOPHEN 160 MG/5ML PO SOLN: 1000.0000 mg | Freq: Once | ORAL | Status: DC | PRN

## 2022-06-22 MED ORDER — FENTANYL CITRATE (PF) 250 MCG/5ML IJ SOLN
INTRAMUSCULAR | Status: DC | PRN
  Administered 2022-06-22: 25 ug via INTRAVENOUS
  Administered 2022-06-22 (×3): 50 ug via INTRAVENOUS
  Administered 2022-06-22: 75 ug via INTRAVENOUS

## 2022-06-22 MED ORDER — SEVELAMER CARBONATE 800 MG PO TABS
1600.0000 mg | ORAL_TABLET | Freq: Three times a day (TID) | ORAL | Status: DC
  Administered 2022-06-23 (×2): 1600 mg via ORAL
  Filled 2022-06-22 (×2): qty 2

## 2022-06-22 MED ORDER — ORAL CARE MOUTH RINSE: 15.0000 mL | Freq: Once | OROMUCOSAL | Status: AC

## 2022-06-22 MED ORDER — CEFAZOLIN SODIUM-DEXTROSE 1-4 GM/50ML-% IV SOLN
1.0000 g | Freq: Once | INTRAVENOUS | Status: AC
  Administered 2022-06-23: 1 g via INTRAVENOUS
  Filled 2022-06-22: qty 50

## 2022-06-22 MED ORDER — CALCITRIOL 0.5 MCG PO CAPS
0.5000 ug | ORAL_CAPSULE | Freq: Every day | ORAL | Status: DC
  Administered 2022-06-23 – 2022-06-29 (×6): 0.5 ug via ORAL
  Filled 2022-06-22 (×6): qty 1

## 2022-06-22 MED ORDER — PROTAMINE SULFATE 10 MG/ML IV SOLN
INTRAVENOUS | Status: DC | PRN
  Administered 2022-06-22: 40 mg via INTRAVENOUS

## 2022-06-22 MED ORDER — LIDOCAINE-EPINEPHRINE (PF) 1 %-1:200000 IJ SOLN
INTRAMUSCULAR | Status: DC | PRN
  Administered 2022-06-22: 22 mL

## 2022-06-22 MED ORDER — ACETAMINOPHEN 10 MG/ML IV SOLN: 1000.0000 mg | Freq: Once | INTRAVENOUS | Status: DC | PRN

## 2022-06-22 MED ORDER — CEFAZOLIN SODIUM-DEXTROSE 1-4 GM/50ML-% IV SOLN
1.0000 g | INTRAVENOUS | Status: DC
  Administered 2022-06-23 – 2022-06-24 (×2): 1 g via INTRAVENOUS
  Filled 2022-06-22 (×2): qty 50

## 2022-06-22 MED ORDER — CHLORHEXIDINE GLUCONATE 0.12 % MT SOLN
15.0000 mL | Freq: Once | OROMUCOSAL | Status: AC
  Administered 2022-06-22: 15 mL via OROMUCOSAL
  Filled 2022-06-22: qty 15

## 2022-06-22 MED ORDER — VANCOMYCIN HCL 1500 MG/300ML IV SOLN
1500.0000 mg | Freq: Once | INTRAVENOUS | Status: AC
  Administered 2022-06-22: 1500 mg via INTRAVENOUS
  Filled 2022-06-22: qty 300

## 2022-06-22 MED ORDER — HEPARIN 6000 UNIT IRRIGATION SOLUTION
Status: DC | PRN
  Administered 2022-06-22 (×2): 1

## 2022-06-22 MED ORDER — SODIUM CHLORIDE 0.9 % IV SOLN: INTRAVENOUS | Status: DC

## 2022-06-22 MED ORDER — SEVELAMER CARBONATE 800 MG PO TABS: 800.0000 mg | ORAL_TABLET | Freq: Three times a day (TID) | ORAL | Status: DC

## 2022-06-22 MED ORDER — SODIUM CHLORIDE 0.9 % IV SOLN: INTRAVENOUS | Status: DC | PRN

## 2022-06-22 MED ORDER — ALBUTEROL SULFATE HFA 108 (90 BASE) MCG/ACT IN AERS
INHALATION_SPRAY | RESPIRATORY_TRACT | Status: DC | PRN
  Administered 2022-06-22: 4 via RESPIRATORY_TRACT

## 2022-06-22 MED ORDER — SUGAMMADEX SODIUM 200 MG/2ML IV SOLN
INTRAVENOUS | Status: DC | PRN
  Administered 2022-06-22: 150 mg via INTRAVENOUS
  Administered 2022-06-22: 50 mg via INTRAVENOUS

## 2022-06-22 MED ORDER — HEPARIN SODIUM (PORCINE) 1000 UNIT/ML IJ SOLN
INTRAMUSCULAR | Status: DC | PRN
  Administered 2022-06-22: 6000 [IU] via INTRAVENOUS

## 2022-06-22 MED ORDER — HEPARIN 6000 UNIT IRRIGATION SOLUTION
Status: AC
  Filled 2022-06-22: qty 500

## 2022-06-22 MED ORDER — PHENOL 1.4 % MT LIQD
1.0000 | OROMUCOSAL | Status: DC | PRN
  Administered 2022-06-22: 1 via OROMUCOSAL
  Filled 2022-06-22: qty 177

## 2022-06-22 MED ORDER — LIDOCAINE-EPINEPHRINE (PF) 1 %-1:200000 IJ SOLN
INTRAMUSCULAR | Status: AC
  Filled 2022-06-22: qty 30

## 2022-06-22 MED ORDER — INSULIN GLARGINE-YFGN 100 UNIT/ML ~~LOC~~ SOLN
9.0000 [IU] | Freq: Every day | SUBCUTANEOUS | Status: DC
  Administered 2022-06-23 – 2022-06-29 (×7): 9 [IU] via SUBCUTANEOUS
  Filled 2022-06-22 (×8): qty 0.09

## 2022-06-22 MED ORDER — LIDOCAINE HCL (PF) 1 % IJ SOLN
INTRAMUSCULAR | Status: AC
  Filled 2022-06-22: qty 30

## 2022-06-22 MED ORDER — HEPARIN SODIUM (PORCINE) 1000 UNIT/ML IJ SOLN
INTRAMUSCULAR | Status: AC
  Filled 2022-06-22: qty 10

## 2022-06-22 MED ORDER — ONDANSETRON HCL 4 MG/2ML IJ SOLN
INTRAMUSCULAR | Status: DC | PRN
  Administered 2022-06-22: 4 mg via INTRAVENOUS

## 2022-06-22 MED ORDER — PHENYLEPHRINE 80 MCG/ML (10ML) SYRINGE FOR IV PUSH (FOR BLOOD PRESSURE SUPPORT)
PREFILLED_SYRINGE | INTRAVENOUS | Status: DC | PRN
  Administered 2022-06-22 (×2): 80 ug via INTRAVENOUS

## 2022-06-22 MED ORDER — ONDANSETRON HCL 4 MG/2ML IJ SOLN
INTRAMUSCULAR | Status: AC
  Filled 2022-06-22: qty 2

## 2022-06-22 MED ORDER — SODIUM CHLORIDE 0.9 % IV SOLN
2.0000 g | INTRAVENOUS | Status: DC
  Filled 2022-06-22: qty 12.5

## 2022-06-22 MED ORDER — FENTANYL CITRATE (PF) 250 MCG/5ML IJ SOLN
INTRAMUSCULAR | Status: AC
  Filled 2022-06-22: qty 5

## 2022-06-22 MED ORDER — SODIUM ZIRCONIUM CYCLOSILICATE 10 G PO PACK
10.0000 g | PACK | Freq: Once | ORAL | Status: AC
  Administered 2022-06-22: 10 g via ORAL
  Filled 2022-06-22: qty 1

## 2022-06-22 MED ORDER — MIDAZOLAM HCL 2 MG/2ML IJ SOLN
INTRAMUSCULAR | Status: AC
  Filled 2022-06-22: qty 2

## 2022-06-22 MED ORDER — SODIUM CHLORIDE 0.9 % IV SOLN
2.0000 g | Freq: Once | INTRAVENOUS | Status: AC
  Administered 2022-06-22: 2 g via INTRAVENOUS

## 2022-06-22 MED ORDER — LIDOCAINE 2% (20 MG/ML) 5 ML SYRINGE
INTRAMUSCULAR | Status: DC | PRN
  Administered 2022-06-22: 60 mg via INTRAVENOUS

## 2022-06-22 MED ORDER — HYDROMORPHONE HCL 1 MG/ML IJ SOLN
0.5000 mg | INTRAMUSCULAR | Status: DC | PRN
  Administered 2022-06-22 – 2022-06-28 (×19): 0.5 mg via INTRAVENOUS
  Filled 2022-06-22 (×20): qty 1

## 2022-06-22 MED ORDER — DARBEPOETIN ALFA 100 MCG/0.5ML IJ SOSY: 100.0000 ug | PREFILLED_SYRINGE | INTRAMUSCULAR | Status: DC

## 2022-06-22 MED ORDER — CINACALCET HCL 30 MG PO TABS
30.0000 mg | ORAL_TABLET | Freq: Every day | ORAL | Status: DC
  Administered 2022-06-23 – 2022-06-28 (×6): 30 mg via ORAL
  Filled 2022-06-22 (×6): qty 1

## 2022-06-22 MED ORDER — ROCURONIUM BROMIDE 10 MG/ML (PF) SYRINGE
PREFILLED_SYRINGE | INTRAVENOUS | Status: DC | PRN
  Administered 2022-06-22: 80 mg via INTRAVENOUS

## 2022-06-22 MED ORDER — CALCIUM CHLORIDE 10 % IV SOLN
INTRAVENOUS | Status: DC | PRN
  Administered 2022-06-22 (×2): 250 mg via INTRAVENOUS

## 2022-06-22 MED ORDER — FENTANYL CITRATE (PF) 100 MCG/2ML IJ SOLN: 25.0000 ug | INTRAMUSCULAR | Status: DC | PRN

## 2022-06-22 MED ORDER — HEPARIN SODIUM (PORCINE) 1000 UNIT/ML IJ SOLN
INTRAMUSCULAR | Status: DC | PRN
  Administered 2022-06-22: 1000 [IU]

## 2022-06-22 MED ORDER — CHLORHEXIDINE GLUCONATE CLOTH 2 % EX PADS
6.0000 | MEDICATED_PAD | Freq: Every day | CUTANEOUS | Status: DC
  Administered 2022-06-23 – 2022-06-29 (×5): 6 via TOPICAL

## 2022-06-22 MED ORDER — VANCOMYCIN HCL 750 MG/150ML IV SOLN
750.0000 mg | INTRAVENOUS | Status: DC
  Filled 2022-06-22 (×2): qty 150

## 2022-06-22 MED ORDER — ACETAMINOPHEN 500 MG PO TABS
1000.0000 mg | ORAL_TABLET | Freq: Once | ORAL | Status: AC
  Administered 2022-06-22: 1000 mg via ORAL
  Filled 2022-06-22: qty 2

## 2022-06-22 MED ORDER — 0.9 % SODIUM CHLORIDE (POUR BTL) OPTIME
TOPICAL | Status: DC | PRN
  Administered 2022-06-22: 1000 mL

## 2022-06-22 SURGICAL SUPPLY — 76 items
ADH SKN CLS APL DERMABOND .7 (GAUZE/BANDAGES/DRESSINGS) ×2
APL PRP STRL LF DISP 70% ISPRP (MISCELLANEOUS) ×2
BAG COUNTER SPONGE SURGICOUNT (BAG) ×2 IMPLANT
BAG DECANTER FOR FLEXI CONT (MISCELLANEOUS) ×2 IMPLANT
BAG SPNG CNTER NS LX DISP (BAG) ×2
BIOPATCH RED 1 DISK 7.0 (GAUZE/BANDAGES/DRESSINGS) ×2 IMPLANT
BLADE SURG 11 STRL SS (BLADE) IMPLANT
BNDG CMPR 5X4 KNIT ELC UNQ LF (GAUZE/BANDAGES/DRESSINGS) ×2
BNDG CMPR 9X4 STRL LF SNTH (GAUZE/BANDAGES/DRESSINGS) ×2
BNDG ELASTIC 4INX 5YD STR LF (GAUZE/BANDAGES/DRESSINGS) IMPLANT
BNDG ESMARK 4X9 LF (GAUZE/BANDAGES/DRESSINGS) IMPLANT
BNDG GAUZE DERMACEA FLUFF 4 (GAUZE/BANDAGES/DRESSINGS) IMPLANT
BNDG GZE DERMACEA 4 6PLY (GAUZE/BANDAGES/DRESSINGS) ×2
CANISTER SUCT 3000ML PPV (MISCELLANEOUS) ×2 IMPLANT
CANNULA VESSEL 3MM 2 BLNT TIP (CANNULA) IMPLANT
CATH PALINDROME-P 19CM W/VT (CATHETERS) IMPLANT
CATH PALINDROME-P 23CM W/VT (CATHETERS) IMPLANT
CATH PALINDROME-P 28CM W/VT (CATHETERS) IMPLANT
CHLORAPREP W/TINT 26 (MISCELLANEOUS) ×2 IMPLANT
CLIP TI MEDIUM 6 (CLIP) ×2 IMPLANT
CLIP TI WIDE RED SMALL 6 (CLIP) ×2 IMPLANT
COVER LIGHT HANDLE 1/PK (MISCELLANEOUS) IMPLANT
COVER MAYO STAND STRL (DRAPES) IMPLANT
COVER PROBE W GEL 5X96 (DRAPES) IMPLANT
COVER SURGICAL LIGHT HANDLE (MISCELLANEOUS) ×2 IMPLANT
CUFF TOURN SGL QUICK 18 NS (TOURNIQUET CUFF) IMPLANT
DERMABOND ADVANCED .7 DNX12 (GAUZE/BANDAGES/DRESSINGS) ×2 IMPLANT
DRAIN PENROSE 18X1/4 LTX STRL (DRAIN) IMPLANT
DRAPE C-ARM 42X72 X-RAY (DRAPES) ×2 IMPLANT
DRAPE CHEST BREAST 15X10 FENES (DRAPES) ×2 IMPLANT
DRAPE IMP U-DRAPE 54X76 (DRAPES) IMPLANT
DRAPE ORTHO SPLIT 77X108 STRL (DRAPES) ×2
DRAPE SURG ORHT 6 SPLT 77X108 (DRAPES) IMPLANT
DRSG COVADERM PLUS 2X2 (GAUZE/BANDAGES/DRESSINGS) IMPLANT
DRSG TEGADERM 2-3/8X2-3/4 SM (GAUZE/BANDAGES/DRESSINGS) IMPLANT
ELECT REM PT RETURN 9FT ADLT (ELECTROSURGICAL) ×2
ELECTRODE REM PT RTRN 9FT ADLT (ELECTROSURGICAL) ×2 IMPLANT
GAUZE 4X4 16PLY ~~LOC~~+RFID DBL (SPONGE) ×2 IMPLANT
GAUZE SPONGE 2X2 8PLY STRL LF (GAUZE/BANDAGES/DRESSINGS) IMPLANT
GAUZE SPONGE 4X4 12PLY STRL LF (GAUZE/BANDAGES/DRESSINGS) IMPLANT
GLOVE BIO SURGEON STRL SZ7.5 (GLOVE) ×2 IMPLANT
GLOVE BIOGEL PI IND STRL 8 (GLOVE) ×2 IMPLANT
GLOVE SURG POLY ORTHO LF SZ7.5 (GLOVE) IMPLANT
GLOVE SURG UNDER LTX SZ8 (GLOVE) ×2 IMPLANT
GOWN STRL REUS W/ TWL LRG LVL3 (GOWN DISPOSABLE) ×6 IMPLANT
GOWN STRL REUS W/TWL LRG LVL3 (GOWN DISPOSABLE) ×6
KIT BASIN OR (CUSTOM PROCEDURE TRAY) ×2 IMPLANT
KIT FULL HD TRIALYSIS 13X20 (MISCELLANEOUS) IMPLANT
KIT PALINDROME-P 55CM (CATHETERS) IMPLANT
KIT TURNOVER KIT B (KITS) ×2 IMPLANT
NDL 18GX1X1/2 (RX/OR ONLY) (NEEDLE) ×2 IMPLANT
NDL HYPO 25GX1X1/2 BEV (NEEDLE) ×2 IMPLANT
NEEDLE 18GX1X1/2 (RX/OR ONLY) (NEEDLE) ×2 IMPLANT
NEEDLE HYPO 25GX1X1/2 BEV (NEEDLE) ×2 IMPLANT
NS IRRIG 1000ML POUR BTL (IV SOLUTION) ×2 IMPLANT
PACK BASIC III (CUSTOM PROCEDURE TRAY)
PACK CV ACCESS (CUSTOM PROCEDURE TRAY) ×2 IMPLANT
PACK SRG BSC III STRL LF ECLPS (CUSTOM PROCEDURE TRAY) ×2 IMPLANT
PAD ARMBOARD 7.5X6 YLW CONV (MISCELLANEOUS) ×4 IMPLANT
SET MICROPUNCTURE 5F STIFF (MISCELLANEOUS) IMPLANT
SPIKE FLUID TRANSFER (MISCELLANEOUS) IMPLANT
SPONGE SURGIFOAM ABS GEL 100 (HEMOSTASIS) IMPLANT
SUT ETHILON 3 0 PS 1 (SUTURE) ×2 IMPLANT
SUT MNCRL AB 4-0 PS2 18 (SUTURE) ×2 IMPLANT
SUT PROLENE 6 0 BV (SUTURE) ×2 IMPLANT
SUT VIC AB 3-0 SH 27 (SUTURE) ×6
SUT VIC AB 3-0 SH 27X BRD (SUTURE) ×2 IMPLANT
SYR 10ML LL (SYRINGE) ×2 IMPLANT
SYR 20ML LL LF (SYRINGE) ×4 IMPLANT
SYR 5ML LL (SYRINGE) ×4 IMPLANT
SYR CONTROL 10ML LL (SYRINGE) ×2 IMPLANT
TOWEL GREEN STERILE (TOWEL DISPOSABLE) ×4 IMPLANT
TOWEL GREEN STERILE FF (TOWEL DISPOSABLE) ×2 IMPLANT
UNDERPAD 30X36 HEAVY ABSORB (UNDERPADS AND DIAPERS) ×2 IMPLANT
WATER STERILE IRR 1000ML POUR (IV SOLUTION) ×2 IMPLANT
WIRE STARTER BENTSON 035X150 (WIRE) IMPLANT

## 2022-06-22 NOTE — ED Provider Notes (Signed)
[place] EMERGENCY DEPARTMENT AT [facility] Provider Note   CSN: [id number] Arrival date & time: 06/22/22  2130     History  Chief Complaint  Patient presents with   Fever   Vascular Access Problem    Barbara Donovan is a 32 y.o. female history of ESRD on hemodialysis Monday Tuesday Thursday Friday presented with fever, possible infection of her fistula since last Saturday.  Patient states that she has been having temperatures of 103 F which have not been resolved with Tylenol.  Patient states is burning in her  fistula site and that is becoming progressively red and appears infected.  Patient still has sensation distally and able to move her arm however she feels that the area is hot to the touch.  Patient has history of MSSA bacteremia from HD line last year.  Patient does state that she has had a cough for the past few days as well and has been seen at urgent care and referred to ED for further evaluation.  Patient has had a few episodes of nausea vomiting over the past 2 days that has been nonbloody and states that initially she thought this was normal as she normally gets nauseous and vomits after dialyzing. Patient still makes urine.  Patient denies chest pain, neck stiffness, change in sensation/motor skill  Home Medications Prior to Admission medications   Medication Sig Start Date End Date Taking? Authorizing Provider  amLODipine (NORVASC) 10 MG tablet Take 1 tablet (10 mg total) by mouth daily. Patient taking differently: Take 10 mg by mouth every evening. 03/09/20   Almon Hercules, MD  calcitRIOL (ROCALTROL) 0.5 MCG capsule Take 0.5 mcg by mouth every Monday, Wednesday, and Friday with hemodialysis. 12/02/20   [provider]  carvedilol (COREG) 25 MG tablet Take 1 tablet (25 mg total) by mouth 2 (two) times daily. 03/07/19   Dyann Kief, PA-C  Continuous Blood Gluc Receiver DEVI 1 Units by Does not apply route 4 (four) times daily -  before meals and at  bedtime. May substitute for cheapest monitor with insurance 03/27/21   Ilene Qua, MD  Continuous Blood Gluc Sensor (DEXCOM G7 SENSOR) MISC 1 Device by Does not apply route as directed. 10/13/21   Shamleffer, Konrad Dolores, MD  diphenhydrAMINE (BENADRYL) 25 MG tablet Take 1 tablet (25 mg total) by mouth every 6 (six) hours as needed. 12/05/21   Edwin Dada P, DO  ferrous sulfate 325 (65 FE) MG tablet Take 325 mg by mouth daily. Patient not taking: Reported on 02/25/2022 05/12/21   [provider]  FLUoxetine (PROZAC) 40 MG capsule Take 40 mg by mouth every morning. Patient not taking: Reported on 02/25/2022 05/15/21   [provider]  glucose blood (FREESTYLE TEST STRIPS) test strip Use as instructed 02/25/21   Lorin Glass, MD  hydrALAZINE (APRESOLINE) 50 MG tablet Take 1 tablet by mouth twice a day, may take 1 extra tablet by mouth daily only as needed for elevated blood pressure Patient taking differently: Take 50 mg by mouth 3 (three) times daily. 04/28/21   Dyann Kief, PA-C  HYDROcodone-acetaminophen (NORCO) 5-325 MG tablet Take 1 tablet by mouth every 6 (six) hours as needed for moderate pain. Patient not taking: Reported on 10/13/2021 08/11/21   Emilie Rutter, PA-C  insulin glargine (LANTUS SOLOSTAR) 100 UNIT/ML Solostar Pen Inject 18 Units into the skin every morning. 02/25/22   Shamleffer, Konrad Dolores, MD  insulin lispro (HUMALOG KWIKPEN) 100 UNIT/ML KwikPen Max daily 35  units 02/25/22   Shamleffer, Konrad Dolores, MD  Insulin Pen Needle 32G X 4 MM MISC 1 Device by Does not apply route in the morning, at noon, in the evening, and at bedtime. 02/25/22   Shamleffer, Konrad Dolores, MD  metoCLOPramide (REGLAN) 10 MG tablet Take 1 tablet (10 mg total) by mouth every 6 (six) hours. 12/05/21   Franne Forts, DO  multivitamin (RENA-VIT) TABS tablet Take 1 tablet by mouth at bedtime. 03/08/20   Almon Hercules, MD  ondansetron (ZOFRAN-ODT) 8 MG disintegrating tablet Take 1 tablet  (8 mg total) by mouth every 8 (eight) hours as needed for nausea or vomiting. 07/31/21   Dione Booze, MD  sertraline (ZOLOFT) 50 MG tablet Take 50 mg by mouth every morning. 09/07/21   [provider]  sevelamer carbonate (RENVELA) 800 MG tablet Take 1 tablet (800 mg total) by mouth 3 (three) times daily with meals. 02/05/21   Osvaldo Shipper, MD      Allergies    Patient has no known allergies.    Review of Systems   Review of Systems  Constitutional:  Positive for fever.    Physical Exam Updated Vital Signs BP (!) 186/97 (BP Location: Right Arm)   Pulse (!) 118   Temp (!) 103.1 F (39.5 C)   Resp (!) 22   Ht 5\' 6"  (1.676 m)   Wt 65.8 kg   SpO2 100%   BMI 23.40 kg/m  Physical Exam Constitutional:      General: She is in acute distress.     Appearance: She is ill-appearing.  Cardiovascular:     Rate and Rhythm: Regular rhythm. Tachycardia present.     Pulses: Normal pulses.     Heart sounds: Normal heart sounds.     Comments: Patent bruit noted fistula 2+ bilateral radial pulses with increased rate Pulmonary:     Effort: No respiratory distress.     Comments: Increased work of breathing Nonproductive cough Lungs clear to auscultation bilaterally Abdominal:     Palpations: Abdomen is soft.     Tenderness: There is no abdominal tenderness. There is no guarding or rebound.  Neurological:     Mental Status: She is alert.     Comments: Sensation intact distally     ED Results / Procedures / Treatments   Labs (all labs ordered are listed, but only abnormal results are displayed) Labs Reviewed  CULTURE, BLOOD (ROUTINE X 2)  CULTURE, BLOOD (ROUTINE X 2)  COMPREHENSIVE METABOLIC PANEL  LACTIC ACID, PLASMA  LACTIC ACID, PLASMA  CBC WITH DIFFERENTIAL/PLATELET  PROTIME-INR  URINALYSIS, W/ REFLEX TO CULTURE (INFECTION SUSPECTED)  I-STAT BETA HCG BLOOD, ED ([facility], [facility], [facility] ONLY)    EKG EKG Interpretation  Date/Time:  Tuesday June 22 2022 07:24:13  EDT Ventricular Rate:  116 PR Interval:  166 QRS Duration: 90 QT Interval:  314 QTC Calculation: 436 R Axis:   32 Text Interpretation: Sinus tachycardia Otherwise normal ECG When compared with ECG of 25-Jul-2021 09:19, PREVIOUS ECG IS PRESENT Confirmed by Vivien Rossetti ([id number]) on 06/22/2022 8:06:46 AM  Radiology No results found.  Procedures .Critical Care  Performed by: Netta Corrigan, PA-C Authorized by: Netta Corrigan, PA-C   Critical care provider statement:    Critical care time (minutes):  30   Critical care time was exclusive of:  Separately billable procedures and treating other patients   Critical care was necessary to treat or prevent imminent or life-threatening deterioration of the following conditions:  Sepsis  Critical care was time spent personally by me on the following activities:  Blood draw for specimens, development of treatment plan with patient or surrogate, discussions with consultants, evaluation of patient's response to treatment, examination of patient, obtaining history from patient or surrogate, review of old charts, re-evaluation of patient's condition, pulse oximetry, ordering and review of radiographic studies, ordering and review of laboratory studies and ordering and performing treatments and interventions   I assumed direction of critical care for this patient from another provider in my specialty: no     Care discussed with: admitting provider       Medications Ordered in ED Medications  acetaminophen (TYLENOL) tablet 1,000 mg (1,000 mg Oral Given 06/22/22 0741)    ED Course/ Medical Decision Making/ A&P                             Medical Decision Making Amount and/or Complexity of Data Reviewed Labs: ordered. Radiology: ordered.  Risk OTC drugs.   RAMONA WEINER 32 y.o. presented today for fistula . Working DDx that I considered at this time includes, but not limited to, infected fistula, sepsis, septic emboli, neurovascular  compromise, occluded fistula, pneumonia.  R/o DDx: PNA, NV compromise: These are considered less likely due to history of present illness and physical exam findings  Review of prior external notes: 06/21/2022 office visit  Unique Tests and My Interpretation:  CBC: Leukocytosis 13.8 PT/INR: Similar to previous reading, unremarkable CBG: 122 CMP: Hyperkalemia 5.7, creatinine 19, anion gap 26 Blood cultures: Pending Lactic acid: 1.2 I-STAT beta-hCG: Negative Chest x-ray: New nodular opacity in the right upper lobe that will need to be rechecked in 4 to 6 weeks  Discussion with Independent Historian: None  Discussion of Management of Tests:  Edilia Bo, MD Vascular ; Marijo Conception, MD Hospitalist  Risk: High: hospitalization or escalation of hospital-level care  Risk Stratification Score: None  Staffed with Elpidio Anis, MD  Plan: Patient presented for fistula infection. On exam patient was ill-appearing and appeared septic on arrival.  Patient's fistula did appear to be infected on exam but was patent.  Area was warm to the touch and appears to be abscess under the skin.  Patient good pulse motor sensation distally.  Patient did have a nonproductive cough in the room and her lungs were clear to auscultation bilaterally.  Patient did have reassuring chest x-ray however chest area did show a nodule noted but no pneumonia.  Labs will be collected and due to patient being ESRD on dialysis fluids to be withheld at this time.  Vancomycin and cefepime will be started and vascular will be consulted.  Patient at this time.  Spoke to the vascular specialist and patient will go to the OR pending labs.  Vascular requested that hospitalist admit.  Hospitalist will be notified.  Patient stable to time.  I spoke to the hospitalist and hospitalist accepted patient for admission once patient returns from the OR.        Final Clinical Impression(s) / ED Diagnoses Final diagnoses:  None    Rx / DC  Orders ED Discharge Orders     None         Remi Deter 06/22/22 1020    Mardene Sayer, MD 06/22/22 [id number]

## 2022-06-22 NOTE — Anesthesia Procedure Notes (Signed)
Procedure Name: Intubation Date/Time: 06/22/2022 10:31 AM  Performed by: Gus Puma, CRNAPre-anesthesia Checklist: Patient identified, Emergency Drugs available, Suction available and Patient being monitored Patient Re-evaluated:Patient Re-evaluated prior to induction Oxygen Delivery Method: Circle System Utilized Preoxygenation: Pre-oxygenation with 100% oxygen Induction Type: IV induction Ventilation: Mask ventilation without difficulty Laryngoscope Size: Mac and 3 Grade View: Grade I Tube type: Oral Tube size: 7.0 mm Number of attempts: 1 Airway Equipment and Method: Stylet Placement Confirmation: ETT inserted through vocal cords under direct vision, positive ETCO2 and breath sounds checked- equal and bilateral Secured at: 21 cm Tube secured with: Tape Dental Injury: Teeth and Oropharynx as per pre-operative assessment

## 2022-06-22 NOTE — Op Note (Signed)
NAME: Barbara Donovan    MRN: [id number] DOB: January 02, 1991    DATE OF OPERATION: 06/22/2022  PREOP DIAGNOSIS:    Infected left upper arm AV graft  POSTOP DIAGNOSIS:    Same  PROCEDURE:    Removal of infected left upper arm AV graft Vein patch angioplasty of the left brachial artery Ultrasound-guided placement of temporary dialysis catheter (left IJ 20 cm catheter)  SURGEON: Di Kindle. Edilia Bo, MD  ASSIST: Graceann Congress, PA  ANESTHESIA: General  EBL: Minimal  INDICATIONS:    Barbara Donovan is a 32 y.o. female who presented with an infected left upper arm graft.  FINDINGS:   The entire graft was infected down to the anastomosis.  TECHNIQUE:   The patient was taken to the operating room and received a general anesthetic.  The left arm was prepped and draped in usual sterile fashion.  I made an incision in the upper part of the arm over the graft where the graft did not appear to be clearly infected.  I then made a separate incision just beyond the anastomosis above the antecubital level.  Here the graft was clearly infected.  Therefore I felt that the entire graft would have to be excised.  The dissection was carried down through scar tissue to the brachial artery.  I then made a longitudinal incision beneath the axilla and here the graft was dissected free to where it was anastomosed end-to-side to the high brachial vein.  The vein was ligated distally and proximally and then the graft excised at the venous anastomosis.  A segment of vein was kept to be used for a vein patch.  I then made 2 additional incisions over the graft and the entire graft was removed.  The area where he did it had eroded through the skin was excised leaving about a 2-1/2 cm defect in diameter.  The entire graft was removed except for the arterial knob.  Patient was heparinized.  A tourniquet was placed on the upper arm.  The arm was exsanguinated with an Esmarch bandage.  Under tourniquet control I  remove the entire graft from the brachial artery.  The vein patch was then sewn using continuous 6-0 Prolene suture.  Prior to completing this anastomosis the tourniquet was released and the artery backbled and flushed.  The anastomosis was completed.  There is a palpable radial pulse at the completion.  The wounds were irrigated and hemostasis obtained.  The larger wound was left packed open.  I did pass 1/4 inch Penrose drain through the incisions.  The remaining incisions were closed with a deep layer of 3-0 Vicryl and the skin closed with interrupted nylons.  Attention was then turned to placement of a temporary catheter.  The only length available was 20 cm.  Therefore I felt I would have to place to place this from the left IJ.  The left IJ was patent.  The neck and upper chest were prepped and draped in usual sterile fashion.  Under ultrasound guidance the left IJ was cannulated with a micropuncture needle and a micropuncture sheath introduced over a wire.  I then advanced the J-wire down into the right atrium.  The tract was dilated over the wire and then the 20 cm catheter advanced over the wire.  Both ports withdrew easily with and flushed with heparinized saline and filled with concentrated heparin.  The cath was secured at its exit site with two 3-0 nylon sutures.  Sterile dressing was applied.  The patient tolerated the procedure well was transferred recovery room in stable condition.  Given the complexity of the case a first assistant was necessary in order to expedient the procedure and safely perform the technical aspects of the operation.  Waverly Ferrari, MD, FACS [facility]  DATE OF DICTATION:   06/22/2022

## 2022-06-22 NOTE — Progress Notes (Signed)
Pharmacy Antibiotic Note  Barbara Donovan is a 32 y.o. female admitted on 06/22/2022 with  infected AV graft s/p removal of the graft .  Pharmacy has been consulted for Cefazolin dosing. Was on vancomycin and cefepime pending cultures. Rapid culture now back with MSSA. ESRD on HD.   Plan: DC Vancomycin and Cefepime  Start Cefazolin 1g IV q24h  Height: 5\' 6"  (167.6 cm) Weight: 65.8 kg (145 lb) IBW/kg (Calculated) : 59.3  Temp (24hrs), Avg:99.3 F (37.4 C), Min:97.7 F (36.5 C), Max:103.1 F (39.5 C)  Recent Labs  Lab 06/22/22 0825 06/22/22 1634  WBC 13.8*  --   CREATININE 19.02*  --   LATICACIDVEN 1.2 0.7    Estimated Creatinine Clearance: 4 mL/min (A) (by C-G formula based on SCr of 19.02 mg/dL (H)).    No Known Allergies  Abran Duke, PharmD, BCPS Clinical Pharmacist Phone: [phone]

## 2022-06-22 NOTE — Consult Note (Signed)
[facility] Nephrology Consult Note: Reason for Consult: To manage dialysis and dialysis related needs Referring Physician: Dr. Debe Coder  HPI:  Barbara Donovan is an 32 y.o. female with past medical history significant for insulin-dependent diabetes, hypertension, anemia, secondary hyperparathyroidism, ESRD, recently started on home hemodialysis presented with a fever and tender AV graft seen as a consultation for the management of ESRD. The patient used to be on in center dialysis at [place] and recently trained home hemodialysis about a month ago.  She was using AV graft, self cannulating and home 4 days a week.  Her last dialysis was on Thursday (5/30) and then unable to do HD because of not feeling well and tender AV graft.  Apparently she went to urgent care and was seen at home therapy clinic.  Arrangement was made for [facility] referral. The patient presented to the ER with fever, generalized weakness and tender AV graft.  She had temperature of 103.1.  Room air and BP was elevated. The lab consistent with leukocytosis, anemia, potassium 5.7, sodium 127, BUN 135 with creatinine level of 19. She was seen by vascular surgeon in ER and underwent removal of infected left upper arm AV graft, vein patch angioplasty of left brachial artery and placement of temporary left IJ HD catheter. The cultures were sent and started vancomycin and cefepime.  The patient denies chest pain, shortness of breath, nausea or vomiting.  Reports feeling weak.  Denies any pain at the moment.  Her boyfriend was presented at the bedside.  OP HD orders: HHD  MTTF Dialyzes at [facility] HD,  3 hrs, 4x/week, EDW 63.4Kg. HD Bath 2k, BFR 400. Access: LUE AVG removed on 6/4. Mircera 150 mcg on 5/14 Calcitriol 0.5 mcg daily Sensipar 30 mg daily Velphoro 3 tabs w/meal   Past Medical History:  Diagnosis Date   Asthma    as a child, no problems as an adult, no inhaler   Cataract    NS OU   Chronic  hypertension during pregnancy, antepartum 08/19/2017   Dehydration 01/28/2018   Depression during pregnancy, antepartum 07/07/2017   6/20: Short trial of zoloft previously, reports didn't help much but also didn't give it a chance Discussed r/b/a SSRIs in pregnancy, agrees to try Zoloft again, rx sent No SI/HI/red flags   Diabetes (HCC)    TYPE I. 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 A2/B GDM)  Required Referrals for A1GDM or A2GDM: [x]  Diabetes Education and Testing Supplies [x]  Nutrition Cousult  For A2/B GDM or higher classes of DM [x]  Diabetes Education and Testing Supplies [x]  Nutrition Counsult [x]  Fetal ECHO after 20 weeks  [x]  Eye exam for retina evaluation - severe retinopathy 7/19  Base   Ventricular septal defect (VSD) of fetus in singleton pregnancy, antepartum 09/30/2017   May go to newborn nursery per Dr. [place] Bing Echo prior to discharge    Past  Surgical History:  Procedure Laterality Date   25 GAUGE PARS PLANA VITRECTOMY WITH 20 GAUGE MVR PORT FOR MACULAR HOLE Left 07/20/2018   Procedure: 25 GAUGE PARS PLANA VITRECTOMY LEFT EYE;  Surgeon: Rennis Chris, MD;  Location: [facility] OR;  Service: Ophthalmology;  Laterality: Left;   AV FISTULA PLACEMENT Left 08/11/2021   Procedure: INSERTION OF LEFT ARM ARTERIOVENOUS (AV) GORE-TEX GRAFT;  Surgeon: Victorino Sparrow, MD;  Location: [facility] OR;   Service: Vascular;  Laterality: Left;  PERIPHERAL NERVE BLOCK   CAPD INSERTION N/A 09/10/2020   Procedure: LAPAROSCOPIC INSERTION CONTINUOUS AMBULATORY PERITONEAL DIALYSIS  (CAPD) CATHETER;  Surgeon: Nada Libman, MD;  Location: [facility] OR;  Service: Vascular;  Laterality: N/A;   CESAREAN SECTION N/A 12/26/2017   Procedure: CESAREAN SECTION;  Surgeon: Tereso Newcomer, MD;  Location: [facility];  Service: Obstetrics;  Laterality: N/A;   EYE EXAMINATION UNDER ANESTHESIA Right 07/20/2018   Procedure: Eye Exam Under Anesthesia RIGHT EYE;  Surgeon: Rennis Chris, MD;  Location: [facility] OR;  Service: Ophthalmology;  Laterality: Right;   EYE SURGERY Left 07/2018   GAS INSERTION Left 07/19/2019   Procedure: INSERTION OF GAS;  Surgeon: Rennis Chris, MD;  Location: [facility] OR;  Service: Ophthalmology;  Laterality: Left;  SF6   INJECTION OF SILICONE OIL Left 07/20/2018   Procedure: Injection Of Silicone Oil LEFT EYE;  Surgeon: Rennis Chris, MD;  Location: [facility] OR;  Service: Ophthalmology;  Laterality: Left;   INSERTION OF DIALYSIS CATHETER Right 07/05/2021   Procedure: REMOVAL OF DIALYSIS CATHETER;  Surgeon: Leonie Douglas, MD;  Location: [facility] OR;  Service: Vascular;  Laterality: Right;   INSERTION OF DIALYSIS CATHETER Right 07/09/2021   Procedure: INSERTION OF TUNNELED DIALYSIS CATHETER;  Surgeon: Leonie Douglas, MD;  Location: [facility] OR;  Service: Vascular;  Laterality: Right;   INSERTION OF DIALYSIS CATHETER Left 07/09/2021   Procedure: INSERTION OF TUNNELED  DIALYSIS CATHETER;  Surgeon: Leonie Douglas, MD;  Location: [facility] OR;  Service: Vascular;  Laterality: Left;   IR FLUORO GUIDE CV LINE RIGHT  06/02/2020   IR US GUIDE VASC ACCESS RIGHT  06/02/2020   LASER PHOTO ABLATION Right 07/20/2018   Procedure: Laser Photo Ablation RIGHT EYE;  Surgeon: Rennis Chris, MD;  Location: [facility] OR;  Service: Ophthalmology;  Laterality: Right;   MEMBRANE PEEL Left 07/20/2018   Procedure: Membrane Peel LEFT EYE;  Surgeon: Rennis Chris, MD;   Location: [facility] OR;  Service: Ophthalmology;  Laterality: Left;   MEMBRANE PEEL Left 07/19/2019   Procedure: MEMBRANE PEEL;  Surgeon: Rennis Chris, MD;  Location: [facility] OR;  Service: Ophthalmology;  Laterality: Left;   MITOMYCIN C APPLICATION Bilateral 07/20/2018   Procedure: Avastin Application;  Surgeon: Rennis Chris, MD;  Location: [facility] OR;  Service: Ophthalmology;  Laterality: Bilateral;   PHOTOCOAGULATION WITH LASER Left 07/20/2018   Procedure: Photocoagulation With Laser LEFT EYE;  Surgeon: Rennis Chris, MD;  Location: [facility] OR;  Service: Ophthalmology;  Laterality: Left;   PHOTOCOAGULATION WITH LASER Left 07/19/2019   Procedure: PHOTOCOAGULATION WITH LASER;  Surgeon: Rennis Chris, MD;  Location: [facility] OR;  Service: Ophthalmology;  Laterality: Left;   RETINAL DETACHMENT SURGERY Left 07/20/2018   Dr. Rennis Chris   SILICON OIL REMOVAL Left 07/19/2019   Procedure: 25g PARS PLANA VITRECTOMY WITH SILICON OIL REMOVAL;  Surgeon: Rennis Chris, MD;  Location: [facility] OR;  Service: Ophthalmology;  Laterality: Left;   TEE WITHOUT CARDIOVERSION N/A 07/08/2021   Procedure: TRANSESOPHAGEAL ECHOCARDIOGRAM (TEE);  Surgeon: Little Ishikawa, MD;  Location:  [facility];  Service: Cardiovascular;  Laterality: N/A;   WISDOM TOOTH EXTRACTION      Family History  Problem Relation Age of Onset   Diabetes Mother    Aneurysm Mother 63   Seizures Mother    Diabetes Father    Cataracts Father    COPD Father    Heart attack Father    Heart disease Father    Healthy Sister    Healthy Daughter    Stroke Maternal Grandfather    Amblyopia Neg Hx    Blindness Neg Hx    Glaucoma Neg Hx    Macular degeneration Neg Hx    Retinal detachment Neg Hx    Strabismus Neg Hx    Retinitis pigmentosa Neg Hx    Colon cancer Neg Hx    Stomach cancer Neg Hx    Esophageal cancer Neg Hx    Pancreatic cancer Neg Hx    Liver disease Neg Hx     Social History:  reports that she has never smoked. She has never used smokeless tobacco.  She reports that she does not currently use alcohol. She reports that she does not use drugs.  Allergies: No Known Allergies  Medications: I have reviewed the patient's current medications.   Results for orders placed or performed during the hospital encounter of 06/22/22 (from the past 48 hour(s))  Culture, blood (Routine x 2)     Status: None (Preliminary result)   Collection Time: 06/22/22  7:37 AM   Specimen: BLOOD RIGHT ARM  Result Value Ref Range   Specimen Description BLOOD RIGHT ARM    Special Requests      BOTTLES DRAWN AEROBIC AND ANAEROBIC Blood Culture adequate volume   Culture      NO GROWTH < 12 HOURS Performed at [facility], [address], [place], [place] [address]    Report Status PENDING   Comprehensive metabolic panel     Status: Abnormal   Collection Time: 06/22/22  8:25 AM  Result Value Ref Range   Sodium 127 (L) 135 - 145 mmol/L   Potassium 5.7 (H) 3.5 - 5.1 mmol/L   Chloride 85 (L) 98 - 111 mmol/L   CO2 16 (L) 22 - 32 mmol/L   Glucose, Bld 132 (H) 70 - 99 mg/dL    Comment: Glucose reference range applies only to samples taken after fasting for at least 8 hours.   BUN 135 (H) 6 - 20 mg/dL   Creatinine, Ser 60.45 (H) 0.44 - 1.00 mg/dL   Calcium 9.3 8.9 - 40.9 mg/dL   Total Protein 7.2 6.5 - 8.1 g/dL   Albumin 3.2 (L) 3.5 - 5.0 g/dL   AST 20 15 - 41 U/L   ALT 33 0 - 44 U/L   Alkaline Phosphatase 99 38 - 126 U/L   Total Bilirubin 0.8 0.3 - 1.2 mg/dL   GFR, Estimated 2 (L) >60 mL/min    Comment: (NOTE) Calculated using the CKD-EPI Creatinine Equation (2021)    Anion gap 26 (H) 5 - 15    Comment: ELECTROLYTES REPEATED TO VERIFY Performed at [facility], [address], [place], [place] [address]   Lactic acid, plasma     Status: None   Collection Time: 06/22/22  8:25 AM  Result Value Ref Range   Lactic Acid, Venous 1.2 0.5 - 1.9 mmol/L    Comment: Performed at [facility], [address], [place], [place] [address]  CBC with  Differential  Status: Abnormal   Collection Time: 06/22/22  8:25 AM  Result Value Ref Range   WBC 13.8 (H) 4.0 - 10.5 K/uL   RBC 2.99 (L) 3.87 - 5.11 MIL/uL   Hemoglobin 8.8 (L) 12.0 - 15.0 g/dL   HCT 01.0 (L) 27.2 - 53.6 %   MCV 89.3 80.0 - 100.0 fL   MCH 29.4 26.0 - 34.0 pg   MCHC 33.0 30.0 - 36.0 g/dL   RDW 64.4 (H) 03.4 - 74.2 %   Platelets 335 150 - 400 K/uL   nRBC 0.0 0.0 - 0.2 %   Neutrophils Relative % 77 %   Neutro Abs 10.7 (H) 1.7 - 7.7 K/uL   Lymphocytes Relative 6 %   Lymphs Abs 0.9 0.7 - 4.0 K/uL   Monocytes Relative 16 %   Monocytes Absolute 2.2 (H) 0.1 - 1.0 K/uL   Eosinophils Relative 0 %   Eosinophils Absolute 0.0 0.0 - 0.5 K/uL   Basophils Relative 0 %   Basophils Absolute 0.0 0.0 - 0.1 K/uL   Immature Granulocytes 1 %   Abs Immature Granulocytes 0.07 0.00 - 0.07 K/uL    Comment: Performed at [facility], [address] [place], [place] [address]  Protime-INR     Status: Abnormal   Collection Time: 06/22/22  8:25 AM  Result Value Ref Range   Prothrombin Time 15.4 (H) 11.4 - 15.2 seconds   INR 1.2 0.8 - 1.2    Comment: (NOTE) INR goal varies based on device and disease states. Performed at [facility], [address] [place], [place] [address]   I-Stat beta hCG blood, ED     Status: None   Collection Time: 06/22/22  8:38 AM  Result Value Ref Range   I-stat hCG, quantitative <5.0 <5 mIU/mL   Comment 3            Comment:   GEST. AGE      CONC.  (mIU/mL)   <=1 WEEK        5 - 50     2 WEEKS       50 - 500     3 WEEKS       100 - 10,000     4 WEEKS     1,000 - 30,000        FEMALE AND NON-PREGNANT FEMALE:     LESS THAN 5 mIU/mL   CBG monitoring, ED     Status: Abnormal   Collection Time: 06/22/22  9:18 AM  Result Value Ref Range   Glucose-Capillary 122 (H) 70 - 99 mg/dL    Comment: Glucose reference range applies only to samples taken after fasting for at least 8 hours.  Aerobic/Anaerobic Culture w Gram Stain (surgical/deep wound)      Status: None (Preliminary result)   Collection Time: 06/22/22 10:52 AM   Specimen: Wound; Body Fluid  Result Value Ref Range   Specimen Description FLUID    Special Requests LEFT ARM PERI GRAFT    Gram Stain      MODERATE WBC PRESENT, PREDOMINANTLY PMN RARE SQUAMOUS EPITHELIAL CELLS PRESENT FEW GRAM POSITIVE COCCI Performed at [facility], [address], [place], [place] [address]    Culture PENDING    Report Status PENDING   Glucose, capillary     Status: Abnormal   Collection Time: 06/22/22 10:59 AM  Result Value Ref Range   Glucose-Capillary 108 (H) 70 - 99 mg/dL    Comment: Glucose reference  range applies only to samples taken after fasting for at least 8 hours.  Glucose, capillary     Status: Abnormal   Collection Time: 06/22/22  1:01 PM  Result Value Ref Range   Glucose-Capillary 116 (H) 70 - 99 mg/dL    Comment: Glucose reference range applies only to samples taken after fasting for at least 8 hours.    DG Chest Port 1 View  Result Date: 06/22/2022 CLINICAL DATA:  Status post insertion of dialysis catheter. EXAM: PORTABLE CHEST 1 VIEW COMPARISON:  Chest radiograph earlier today FINDINGS: Dual lumen left internal jugular dialysis catheter tip is in the right atrium. No pneumothorax. Overall low lung volumes. Stable cardiomegaly. Mediastinal contours are unchanged. The faint right upper lobe opacity is not well delineated on the current exam, with no definite nodular component. There may be small bilateral pleural effusions. IMPRESSION: 1. Tip of the dual lumen left internal jugular dialysis catheter is in the right atrium. No pneumothorax. 2. Stable cardiomegaly. 3. The previous right upper lobe nodular density is faintly visualized on the current exam. Electronically Signed   By: Narda Rutherford M.D.   On: 06/22/2022 16:50   DG C-Arm 1-60 Min-No Report  Result Date: 06/22/2022 Fluoroscopy was utilized by the requesting physician.  No radiographic interpretation.    DG Chest 2 View  Result Date: 06/22/2022 CLINICAL DATA:  Rule out sepsis. EXAM: CHEST - 2 VIEW COMPARISON:  07/02/2021. FINDINGS: New nodular opacity in the right upper lobe. No consolidation or pulmonary edema. Stable cardiac and mediastinal contours. No pleural effusion or pneumothorax. Visualized bones and upper abdomen are unremarkable. IMPRESSION: New nodular opacity in the right upper lobe, which may be infectious, inflammatory or neoplastic in etiology. Recommend follow-up chest radiograph in 4-6 weeks to ensure resolution. Electronically Signed   By: Orvan Falconer M.D.   On: 06/22/2022 08:13    ROS: As per H&P.  Rest of the systems reviewed and are negative. Blood pressure (!) 141/79, pulse 89, temperature 97.7 F (36.5 C), temperature source Oral, resp. rate 18, height 5\' 6"  (1.676 m), weight 65.8 kg, SpO2 99 %. Gen: NAD, comfortable Respiratory: Basal rhonchi, no increased work of breathing Cardiovascular: Regular rate rhythm S1-S2 normal, no rubs GI: Abdomen soft, nontender, nondistended Extremities, no cyanosis or clubbing, no edema Skin: No rash or ulcer Neurology: Alert, awake, following commands, oriented Dialysis Access: Left upper extremity has dressing applied.  Left IJ in place, site clean  Assessment/Plan:  # Infected left AV graft with sepsis: Status post removal of infected left AV graft by vascular surgeon on 6/4.  Pending culture.  Currently on vancomycin and cefepime.  # ESRD on HHD 4x/week: Last dialysis about 5 days ago and labs abnormal.  We will plan to do HD tonight via left IJ. Once infection clears she will need tunneled HD catheter placement.  I discussed with the patient that she may need to do in center HD with the catheter while awaiting new perm access because of increased risk of infection.  We will continue to discuss her clinical progress.  # Hypertension/volume overload: Resume home medication.  Plan to do HD tonight with ultrafiltration.  #  Anemia of ESRD: Hemoglobin is low partly contributed by dilution as well.  I will dose Aranesp with HD.  No iron because of infection.  # Secondary hyperparathyroidism/hyperphosphatemia: Resume calcitriol, Sensipar and binders.  # Hypervolemic hyponatremia, hyperkalemia, acidosis all due to incomplete/missing dialysis.  May need another session of dialysis tomorrow.  Monitor lab.  Discussed with the patient and her boyfriend. Thank you for the consult.  We will continue to follow.  Vicy Medico Jaynie Collins 06/22/2022, 5:32 PM

## 2022-06-22 NOTE — ED Notes (Signed)
Attempted PIV in right hand x2 without success. Phlebotomist unable to obtain labs as well.

## 2022-06-22 NOTE — Progress Notes (Signed)
Discussed with Jerene Pitch, PA in ED. Barbara Donovan is a patient with the internal medicine clinic. She is currently undergoing surgical intervention with vascular surgery for an infected AV graft. Plan for IMTS to take over as primary team post-operatively.   Evlyn Kanner, MD Internal Medicine PGY-3 Pager: [phone]

## 2022-06-22 NOTE — ED Notes (Signed)
Got patient on the monitor into a gown atient is resting with call bell in reach

## 2022-06-22 NOTE — Anesthesia Preprocedure Evaluation (Signed)
Anesthesia Evaluation  Patient identified by MRN, date of birth, ID band Patient awake    Reviewed: Allergy & Precautions, NPO status , Patient's Chart, lab work & pertinent test results  History of Anesthesia Complications Negative for: history of anesthetic complications  Airway Mallampati: III  TM Distance: >3 FB Neck ROM: Full    Dental  (+) Teeth Intact, Dental Advisory Given   Pulmonary neg shortness of breath, asthma , neg sleep apnea, neg recent URI   breath sounds clear to auscultation       Cardiovascular hypertension, Pt. on medications and Pt. on home beta blockers (-) angina +CHF  (-) Past MI  Rhythm:Regular Rate:Tachycardia     Neuro/Psych  PSYCHIATRIC DISORDERS Anxiety Depression    negative neurological ROS     GI/Hepatic negative GI ROS, Neg liver ROS,,,  Endo/Other  diabetes, Type 1, Insulin Dependent  Lab Results      Component                Value               Date                      HGBA1C                   6.4 (A)             02/25/2022             Renal/GU ESRF and DialysisRenal diseaseLab Results      Component                Value               Date                      NA                       127 (L)             06/22/2022                K                        5.7 (H)             06/22/2022                CO2                      16 (L)              06/22/2022                GLUCOSE                  132 (H)             06/22/2022                BUN                      135 (H)             06/22/2022                CREATININE               19.02 (H)  06/22/2022                CALCIUM                  9.3                 06/22/2022                GFRNONAA                 2 (L)               06/22/2022                Musculoskeletal negative musculoskeletal ROS (+)    Abdominal   Peds  Hematology  (+) Blood dyscrasia, anemia Lab Results      Component                Value                Date                      WBC                      13.8 (H)            06/22/2022                HGB                      8.8 (L)             06/22/2022                HCT                      26.7 (L)            06/22/2022                MCV                      89.3                06/22/2022                PLT                      335                 06/22/2022              Anesthesia Other Findings Last HD Thurs, infected dialysis graft  Reproductive/Obstetrics Lab Results      Component                Value               Date                      PREGTESTUR               NEGATIVE            07/31/2021                PREGSERUM                NEGATIVE            07/25/2021  HCG                      <5.0                06/22/2022                                        Anesthesia Physical Anesthesia Plan  ASA: 4 and emergent  Anesthesia Plan: General   Post-op Pain Management: Tylenol PO (pre-op)*   Induction: Intravenous and Rapid sequence  PONV Risk Score and Plan: 3 and Ondansetron and Dexamethasone  Airway Management Planned: Oral ETT  Additional Equipment:   Intra-op Plan:   Post-operative Plan: Possible Post-op intubation/ventilation  Informed Consent: I have reviewed the patients History and Physical, chart, labs and discussed the procedure including the risks, benefits and alternatives for the proposed anesthesia with the patient or authorized representative who has indicated his/her understanding and acceptance.     Dental advisory given  Plan Discussed with: CRNA  Anesthesia Plan Comments:        Anesthesia Quick Evaluation

## 2022-06-22 NOTE — H&P (Cosign Needed Addendum)
NAME:  Barbara Donovan, MRN:  [id number], DOB:  01-Apr-1990, LOS: 0 ADMISSION DATE:  06/22/2022, Primary: Dellis Filbert, MD (Inactive)  CHIEF COMPLAINT:  bump in left arm  Medical Service: Internal Medicine Teaching Service         Attending Physician: Dr. Inez Catalina, MD    First Contact: Annette Stable, Medical Student Dr. Rocky Morel, DO Pager: [phone]  Second Contact: Dr. Evlyn Kanner, MD Pager: [phone]       After Hours (After 5p/  First Contact Pager: [phone]  weekends / holidays): Second Contact Pager: [phone]   HISTORY OF PRESENT ILLNESS  Barbara Donovan is a 32 yo female with past medical history significant for ESRD on home dialysis 4x a week, insulin-dependent DM, and HTN who presented to the ED with a bump on left arm. On Saturday patient noticed a bump on her fistula and felt a burning pain. She had a fever of 103 and was breaking out in sweat and would wake up in pools of sweat. No sick contacts. Patient presented to urgent care where she tested negative for covid and flu. Patient left and visited her dialysis center and they made her an appointment with [facility] who told her to come to the ED. Patient reports Thursday she was having dialysis at home when she felt pain all over her body with N/V. Patient does not remember the emesis color but she has not been able to tolerate any solids. Patient has not had dialysis since Thursday. Patient still makes urine. Has a history of MSSA bacteremia from HD line last year. AV graft was placed in July of 2023 and started home dialysis on April 2024, reports using clean and new supplies every time. Patient denies hematoemesis, CP, SOB, or neck stiffness.   PCP: Dellis Filbert, MD (Inactive)  ED COURSE  Patient presented ill-appearing and septic. Vancomycin and cefepime were started. Vascular surgery consulted and later performed left arm AV graft excision. Temporary dialysis catheter was  placed with plans to convert to [facility] at a later date.   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 Patient lives in [place] with her son and boyfriend. No tobacco, alcohol, or IV drug use.   FAMILY HISTORY   Her family history includes Aneurysm (age of onset: 37) in her mother; COPD in her father; Cataracts in her father; Diabetes in her father and mother; Healthy in her daughter and sister; Heart attack in her father; Heart disease in  her father; Seizures in her mother; Stroke in her maternal grandfather. There is no history of Amblyopia, Blindness, Glaucoma, Macular degeneration, Retinal detachment, Strabismus, Retinitis pigmentosa, Colon cancer, Stomach cancer, Esophageal cancer, Pancreatic cancer, or  Liver disease.   REVIEW OF SYSTEMS   ROS per history of present illness.  PHYSICAL EXAMINATION   Blood pressure 128/69, pulse 85, temperature 97.9 F (36.6 C), resp. rate 17, height 5\' 6"  (1.676 m), weight 65.8 kg, SpO2 96 %.    Filed Weights   06/22/22 0735 06/22/22 0945  Weight: 65.8 kg 65.8 kg   GENERAL: Drowsy in no acute distress HENT: Normocephalic, atraumatic.  EYES: Vision grossly intact. No conjunctival injection or scleral icterus.  CV: Regular rate, rhythm. Faint systolic murmur appreciated. PULM: Normal work of breathing on room air. Clear to auscultation bilaterally.  GI: Abdomen soft, non-tender, non-distended. Normoactive bowel sounds. MSK: Normal bulk, tone. No peripheral edema noted.  SKIN: Warm and dry. Left arm wrapped after recent surgery.  NEURO: Awake, conversing appropriately. No motor or sensory defects below incision site.  PSYCH: Normal mood, affect, speech  SIGNIFICANT DIAGNOSTIC TESTS   ECG: Sinus Tachycardia  I personally reviewed patient's ECG with my interpretation as above.  CXR: Nodular opacity in RUL as compared to 07/02/2021. No acute cardiac pathology. No pulmonary edema, consolidation, pleural effusion or pneumothorax.   I personally reviewed patient's CXR with my interpretation as above.  LABS      Latest Ref Rng & Units 06/22/2022    8:25 AM 12/05/2021    3:46 PM 08/11/2021    8:13 AM  CBC  WBC 4.0 - 10.5 K/uL 13.8  6.1    Hemoglobin 12.0 - 15.0 g/dL 8.8  29.5  62.1   Hematocrit 36.0 - 46.0 % 26.7  37.4  31.0   Platelets 150 - 400 K/uL 335  315        Latest Ref Rng & Units 06/22/2022    8:25 AM 12/05/2021    7:56 PM 08/11/2021    8:13 AM  BMP  Glucose 70 - 99 mg/dL 308  657  846   BUN 6 - 20 mg/dL 962  38  34   Creatinine 0.44 - 1.00 mg/dL 95.28  4.13  2.44   Sodium 135 - 145 mmol/L 127  141  137   Potassium 3.5 - 5.1 mmol/L 5.7  4.0  4.2   Chloride 98 - 111 mmol/L 85  96  105   CO2 22 - 32 mmol/L 16  30    Calcium 8.9 -  10.3 mg/dL 9.3  9.1     Lab Results  Component Value Date/Time   Lactic Acid, Venous 1.2 06/22/2022 0825   Culture PENDING 06/22/2022 1052   INR 1.2 06/22/2022 0825   Prothrombin Time 15.4 (H) 06/22/2022 0825   I-stat hCG, quantitative <5.0 06/22/2022 0102    ASSESSMENT   Amberli Salt is a 32 yo female with past medical history significant for 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 Patient was tachycardic, hypertensive, febrile, with a WBC of  13.8, and lactic acid of 1.2. Vascular surgery performed extraction and found entire graft was infected down to the  anastomosis. Graft cultures showed few GPC and moderate WBC present, predominately PMN. Patient temperature has stabilized from 103.1 F to 97.7 F. Will continue IV antibiotics and monitor for signs of systemic infection while cultures are pending.   -Vancomycin 1500 mg IV x1  -Cefepime 2g IV x1, followed by cefepime 1g IV q24h -Blood cultures sent  #ESRD #Anion Gap Metabolic Acidosis #Uremia  Patients last dialysis was 5 days ago. AGMA most likely due to uremia. K is 5.7, Na is 127, Cl is 85, Bicarb is 16, BUN is 135, Cr is 19.02. eGFR is 2. Anion gap is 26.  -Hemodialysis -Nephrology consulted   #Diabetes Well controlled, A1c 6.4 in 02/2022. Blood glucose is 132. Decrease home insulin to 9 units due to decreased food intake.  - repeat a1c -Glargine 9 units qAM  -SSI   #HTN  Elevated. SBP in 180-120s, DBP 90-60s. Seem to have stabilized to 140/80 after surgery. Will hold home medication until anesthesia subsides. Consider restarting one of BP rises overnight.  - HOLD Carvedilol 25 mg tablet BID  - HOLD Hydralazine 50 mg  TID   #Right upper lobe nodule  New nodular opacity in the right upper lobe, which may be infectious, inflammatory or neoplastic.  - follow-up in 4-6 weeks to repeat CXR   BEST PRACTICE  DIET: thin with 1200 mL fluid restriction DVT PPX: heparin  CODE: Full FAM COM: Boyfriend, Sharlyn Bologna   DISPO: Admit patient to Inpatient with expected length of stay greater than 2 midnights.  Annette Stable Medical Student 06/22/2022, 3:48 PM  Pager: [phone]      Attestation for Student Documentation:  I personally was present and performed or re-performed the history, physical exam and medical decision-making activities of this service and have verified that the service and findings are accurately documented in the student's note.  Rocky Morel, DO 06/22/2022, 5:17 PM .

## 2022-06-22 NOTE — ED Triage Notes (Signed)
Pt reports noted dialysis fistula, burning with possible infection. Noted to have a fever for the last few days per patient. Per patient having sharp pains in left chest area. Per patient has had a cough for a couple weeks. Last full dialysis session last Thursday. Denies SOB, appears anxious.

## 2022-06-22 NOTE — Progress Notes (Addendum)
Pharmacy Antibiotic Note  Barbara Donovan is a 32 y.o. female with T1DM and ESRD on hemodialysis admitted on 06/22/2022 with dialysis site/fistula infection now s/p removal of infected left upper arm AV graft. Patient normally dialyzes MTTF outpatient. Pharmacy has been consulted for vancomycin and cefepime dosing.  Pending nephrology plans for inpatient HD schedule. Need to dose vancomycin 750mg  post HD sesions.   Plan: Vancomycin 1500 mg IV x1  Will follow up on HD schedule and order 750mg  post HD  Cefepime 2g IV x1, followed by cefepime 1g IV q24h Monitor clinical progress and cultures     Height: 5\' 6"  (167.6 cm) Weight: 65.8 kg (145 lb) IBW/kg (Calculated) : 59.3  Temp (24hrs), Avg:100.3 F (37.9 C), Min:98 F (36.7 C), Max:103.1 F (39.5 C)  Recent Labs  Lab 06/22/22 0825  WBC 13.8*  CREATININE 19.02*  LATICACIDVEN 1.2     Estimated Creatinine Clearance: 4 mL/min (A) (by C-G formula based on SCr of 19.02 mg/dL (H)).    No Known Allergies  Antimicrobials this admission: Vancomycin 6/4 >>  Cefepime 6/4 >>    Microbiology results: 6/4 BCx: sent 6/4 Graft cx: few GPC  Thank you for allowing pharmacy to be a part of this patient's care.  Alphia Moh, PharmD, BCPS, BCCP Clinical Pharmacist  Please check AMION for all [facility] Pharmacy phone numbers After 10:00 PM, call Main Pharmacy [phone]

## 2022-06-22 NOTE — Progress Notes (Signed)
PHARMACY - PHYSICIAN COMMUNICATION CRITICAL VALUE ALERT - BLOOD CULTURE IDENTIFICATION (BCID)  Barbara Donovan is an 32 y.o. female who presented to [facility] on 06/22/2022 with a chief complaint of infected AV graft now s/p removal  Name of physician (or Provider) Contacted: Dr Daiva Eves (ID) aware of results and has already ordered Cefazolin  Current antibiotics: Vancomycin, Cefepime  Changes to prescribed antibiotics recommended:  Vancomycin and Cefepime have been DC'd Cefazolin has been started  Results for orders placed or performed during the hospital encounter of 06/22/22  Blood Culture ID Panel (Reflexed) (Collected: 06/22/2022  7:37 AM)  Result Value Ref Range   Enterococcus faecalis NOT DETECTED NOT DETECTED   Enterococcus Faecium NOT DETECTED NOT DETECTED   Listeria monocytogenes NOT DETECTED NOT DETECTED   Staphylococcus species DETECTED (A) NOT DETECTED   Staphylococcus aureus (BCID) DETECTED (A) NOT DETECTED   Staphylococcus epidermidis NOT DETECTED NOT DETECTED   Staphylococcus lugdunensis NOT DETECTED NOT DETECTED   Streptococcus species NOT DETECTED NOT DETECTED   Streptococcus agalactiae NOT DETECTED NOT DETECTED   Streptococcus pneumoniae NOT DETECTED NOT DETECTED   Streptococcus pyogenes NOT DETECTED NOT DETECTED   A.calcoaceticus-baumannii NOT DETECTED NOT DETECTED   Bacteroides fragilis NOT DETECTED NOT DETECTED   Enterobacterales NOT DETECTED NOT DETECTED   Enterobacter cloacae complex NOT DETECTED NOT DETECTED   Escherichia coli NOT DETECTED NOT DETECTED   Klebsiella aerogenes NOT DETECTED NOT DETECTED   Klebsiella oxytoca NOT DETECTED NOT DETECTED   Klebsiella pneumoniae NOT DETECTED NOT DETECTED   Proteus species NOT DETECTED NOT DETECTED   Salmonella species NOT DETECTED NOT DETECTED   Serratia marcescens NOT DETECTED NOT DETECTED   Haemophilus influenzae NOT DETECTED NOT DETECTED   Neisseria meningitidis NOT DETECTED NOT DETECTED   Pseudomonas  aeruginosa NOT DETECTED NOT DETECTED   Stenotrophomonas maltophilia NOT DETECTED NOT DETECTED   Candida albicans NOT DETECTED NOT DETECTED   Candida auris NOT DETECTED NOT DETECTED   Candida glabrata NOT DETECTED NOT DETECTED   Candida krusei NOT DETECTED NOT DETECTED   Candida parapsilosis NOT DETECTED NOT DETECTED   Candida tropicalis NOT DETECTED NOT DETECTED   Cryptococcus neoformans/gattii NOT DETECTED NOT DETECTED   Meth resistant mecA/C and MREJ NOT DETECTED NOT DETECTED    Abran Duke 06/22/2022  11:07 PM

## 2022-06-22 NOTE — Consult Note (Addendum)
Hospital Consult    Reason for Consult: Suspected infected left arm AV graft Requesting Physician:  ED MRN #:  [id number]  History of Present Illness: This is a 32 y.o. female with past medical history significant for end-stage renal disease on dialysis and insulin-dependent diabetes mellitus.  She presented to the ED today with a painful, swollen, and warm left arm with underlying left arm AV graft.  AV graft was placed in July of last year by Dr. Karin Lieu.  Area in question has been painful and red since Saturday morning.  Last completed HD treatment from graft was on Thursday, 06/17/2022.  Patient also reports a fever up to 102.  It is 101 today in the ER.  She has not had anything to eat or drink this morning.  She does not take a blood thinner.  Past Medical History:  Diagnosis Date   Asthma    as a child, no problems as an adult, no inhaler   Cataract    NS OU   Chronic hypertension during pregnancy, antepartum 08/19/2017   Dehydration 01/28/2018   Depression during pregnancy, antepartum 07/07/2017   6/20: Short trial of zoloft previously, reports didn't help much but also didn't give it a chance Discussed r/b/a SSRIs in pregnancy, agrees to try Zoloft again, rx sent No SI/HI/red flags   Diabetes (HCC)    TYPE I. 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 A2/B GDM)  Required  Referrals for A1GDM or A2GDM: [x]  Diabetes Education and Testing Supplies [x]  Nutrition Cousult  For A2/B GDM or higher classes of DM [x]  Diabetes Education and Testing Supplies [x]  Nutrition Counsult [x]  Fetal ECHO after 20 weeks  [x]  Eye exam for retina evaluation - severe retinopathy 7/19  Base   Ventricular septal defect (VSD) of fetus in singleton pregnancy, antepartum 09/30/2017   May go to newborn nursery per Dr. [place] Echo prior to discharge    Past Surgical History:  Procedure Laterality Date   25 GAUGE PARS PLANA VITRECTOMY WITH 20 GAUGE MVR PORT FOR MACULAR HOLE Left 07/20/2018   Procedure: 25 GAUGE PARS PLANA VITRECTOMY LEFT EYE;  Surgeon: Rennis Chris, MD;  Location: [facility] OR;  Service: Ophthalmology;  Laterality: Left;   AV FISTULA PLACEMENT Left 08/11/2021   Procedure: INSERTION OF LEFT ARM ARTERIOVENOUS (AV) GORE-TEX GRAFT;  Surgeon: Victorino Sparrow, MD;  Location: [facility] OR;  Service: Vascular;  Laterality: Left;  PERIPHERAL NERVE BLOCK   CAPD INSERTION N/A 09/10/2020   Procedure: LAPAROSCOPIC INSERTION CONTINUOUS AMBULATORY PERITONEAL DIALYSIS  (CAPD) CATHETER;  Surgeon: Nada Libman, MD;  Location: [facility] OR;  Service: Vascular;  Laterality: N/A;   CESAREAN SECTION N/A 12/26/2017   Procedure: CESAREAN SECTION;  Surgeon: Tereso Newcomer, MD;  Location: [facility];  Service: Obstetrics;  Laterality: N/A;   EYE EXAMINATION UNDER ANESTHESIA Right 07/20/2018   Procedure: Eye Exam Under Anesthesia RIGHT EYE;  Surgeon:  Rennis Chris, MD;  Location: [facility] OR;  Service: Ophthalmology;  Laterality: Right;   EYE SURGERY Left 07/2018   GAS INSERTION Left 07/19/2019   Procedure: INSERTION OF GAS;  Surgeon: Rennis Chris, MD;  Location: [facility] OR;  Service: Ophthalmology;  Laterality: Left;  SF6   INJECTION OF SILICONE OIL Left 07/20/2018   Procedure: Injection Of Silicone Oil LEFT EYE;  Surgeon: Rennis Chris, MD;  Location: [facility] OR;  Service: Ophthalmology;  Laterality: Left;   INSERTION OF DIALYSIS  CATHETER Right 07/05/2021   Procedure: REMOVAL OF DIALYSIS CATHETER;  Surgeon: Leonie Douglas, MD;  Location: [facility] OR;  Service: Vascular;  Laterality: Right;   INSERTION OF DIALYSIS CATHETER Right 07/09/2021   Procedure: INSERTION OF TUNNELED DIALYSIS CATHETER;  Surgeon: Leonie Douglas, MD;  Location: [facility] OR;  Service: Vascular;  Laterality: Right;   INSERTION OF DIALYSIS CATHETER Left 07/09/2021   Procedure: INSERTION OF TUNNELED  DIALYSIS CATHETER;  Surgeon: Leonie Douglas, MD;  Location: [facility] OR;  Service: Vascular;  Laterality: Left;   IR FLUORO GUIDE CV LINE RIGHT  06/02/2020   IR US GUIDE VASC ACCESS RIGHT  06/02/2020   LASER PHOTO ABLATION Right 07/20/2018   Procedure: Laser Photo Ablation RIGHT EYE;  Surgeon: Rennis Chris, MD;  Location: [facility] OR;  Service: Ophthalmology;  Laterality: Right;   MEMBRANE PEEL Left 07/20/2018   Procedure: Membrane Peel LEFT EYE;  Surgeon: Rennis Chris, MD;  Location: [facility] OR;  Service: Ophthalmology;  Laterality: Left;   MEMBRANE PEEL Left 07/19/2019   Procedure: MEMBRANE PEEL;  Surgeon: Rennis Chris, MD;  Location: [facility] OR;  Service: Ophthalmology;  Laterality: Left;   MITOMYCIN C APPLICATION Bilateral 07/20/2018   Procedure: Avastin Application;  Surgeon: Rennis Chris, MD;  Location: [facility] OR;  Service: Ophthalmology;  Laterality: Bilateral;   PHOTOCOAGULATION WITH LASER Left 07/20/2018   Procedure: Photocoagulation With Laser LEFT EYE;  Surgeon: Rennis Chris, MD;  Location: [facility] OR;  Service: Ophthalmology;  Laterality: Left;   PHOTOCOAGULATION WITH LASER Left 07/19/2019   Procedure: PHOTOCOAGULATION WITH LASER;  Surgeon: Rennis Chris, MD;  Location: [facility] OR;  Service: Ophthalmology;  Laterality: Left;   RETINAL DETACHMENT SURGERY Left 07/20/2018   Dr. Rennis Chris   SILICON OIL REMOVAL Left 07/19/2019   Procedure: 25g PARS PLANA VITRECTOMY WITH SILICON OIL REMOVAL;  Surgeon: Rennis Chris, MD;  Location: [facility] OR;  Service: Ophthalmology;  Laterality: Left;   TEE WITHOUT  CARDIOVERSION N/A 07/08/2021   Procedure: TRANSESOPHAGEAL ECHOCARDIOGRAM (TEE);  Surgeon: Little Ishikawa, MD;  Location: [facility] ENDOSCOPY;  Service: Cardiovascular;  Laterality: N/A;   WISDOM TOOTH EXTRACTION      No Known Allergies  Prior to Admission medications   Medication Sig Start Date End Date Taking? Authorizing Provider  amLODipine (NORVASC) 10 MG tablet Take 1 tablet (10 mg total) by mouth daily. Patient taking differently: Take 10 mg by mouth every evening. 03/09/20   Almon Hercules, MD  calcitRIOL (ROCALTROL) 0.5 MCG capsule Take 0.5 mcg by mouth every Monday, Wednesday, and Friday with hemodialysis. 12/02/20   [provider]  carvedilol (COREG) 25 MG tablet Take 1 tablet (25 mg total) by mouth 2 (two) times daily. 03/07/19   Dyann Kief, PA-C  Continuous Blood Gluc Receiver DEVI 1 Units by Does not apply route 4 (four) times daily -  before meals and at bedtime. May substitute for cheapest monitor with insurance 03/27/21   Ilene Qua, MD  Continuous Blood Gluc Sensor (DEXCOM G7 SENSOR) MISC 1 Device by  Does not apply route as directed. 10/13/21   Shamleffer, Konrad Dolores, MD  diphenhydrAMINE (BENADRYL) 25 MG tablet Take 1 tablet (25 mg total) by mouth every 6 (six) hours as needed. 12/05/21   Edwin Dada P, DO  ferrous sulfate 325 (65 FE) MG tablet Take 325 mg by mouth daily. Patient not taking: Reported on 02/25/2022 05/12/21   [provider]  FLUoxetine (PROZAC) 40 MG capsule Take 40 mg by mouth every morning. Patient not taking: Reported on 02/25/2022 05/15/21   [provider]  glucose blood (FREESTYLE TEST STRIPS) test strip Use as instructed 02/25/21   Lorin Glass, MD  hydrALAZINE (APRESOLINE) 50 MG tablet Take 1 tablet by mouth twice a day, may take 1 extra tablet by mouth daily only as needed for elevated blood pressure Patient taking differently: Take 50 mg by mouth 3 (three) times daily. 04/28/21   Dyann Kief, PA-C   HYDROcodone-acetaminophen (NORCO) 5-325 MG tablet Take 1 tablet by mouth every 6 (six) hours as needed for moderate pain. Patient not taking: Reported on 10/13/2021 08/11/21   Emilie Rutter, PA-C  insulin glargine (LANTUS SOLOSTAR) 100 UNIT/ML Solostar Pen Inject 18 Units into the skin every morning. 02/25/22   Shamleffer, Konrad Dolores, MD  insulin lispro (HUMALOG KWIKPEN) 100 UNIT/ML KwikPen Max daily 35 units 02/25/22   Shamleffer, Konrad Dolores, MD  Insulin Pen Needle 32G X 4 MM MISC 1 Device by Does not apply route in the morning, at noon, in the evening, and at bedtime. 02/25/22   Shamleffer, Konrad Dolores, MD  metoCLOPramide (REGLAN) 10 MG tablet Take 1 tablet (10 mg total) by mouth every 6 (six) hours. 12/05/21   Franne Forts, DO  multivitamin (RENA-VIT) TABS tablet Take 1 tablet by mouth at bedtime. 03/08/20   Almon Hercules, MD  ondansetron (ZOFRAN-ODT) 8 MG disintegrating tablet Take 1 tablet (8 mg total) by mouth every 8 (eight) hours as needed for nausea or vomiting. 07/31/21   Dione Booze, MD  sertraline (ZOLOFT) 50 MG tablet Take 50 mg by mouth every morning. 09/07/21   [provider]  sevelamer carbonate (RENVELA) 800 MG tablet Take 1 tablet (800 mg total) by mouth 3 (three) times daily with meals. 02/05/21   Osvaldo Shipper, MD    Social History   Socioeconomic History   Marital status: Significant Other    Spouse name: Not on file   Number of children: Not on file   Years of education: Not on file   Highest education level: Not on file  Occupational History   Not on file  Tobacco Use   Smoking status: Never   Smokeless tobacco: Never  Vaping Use   Vaping Use: Never used  Substance and Sexual Activity   Alcohol use: Not Currently   Drug use: Never   Sexual activity: Not Currently    Birth control/protection: None  Other Topics Concern   Not on file  Social History Narrative   Not on file   Social Determinants of Health   Financial Resource Strain:  Not on file  Food Insecurity: No Food Insecurity (03/22/2019)   Hunger Vital Sign    Worried About Running Out of Food in the Last Year: Never true    Ran Out of Food in the Last Year: Never true  Transportation Needs: No Transportation Needs (03/22/2019)   PRAPARE - Administrator, Civil Service (Medical): No    Lack of Transportation (Non-Medical): No  Physical Activity: Inactive (03/22/2019)  Exercise Vital Sign    Days of Exercise per Week: 0 days    Minutes of Exercise per Session: 0 min  Stress: No Stress Concern Present (03/22/2019)   [employer] of Occupational Health - Occupational Stress Questionnaire    Feeling of Stress : Not at all  Social Connections: Not on file  Intimate Partner Violence: Not on file     Family History  Problem Relation Age of Onset   Diabetes Mother    Aneurysm Mother 71   Seizures Mother    Diabetes Father    Cataracts Father    COPD Father    Heart attack Father    Heart disease Father    Healthy Sister    Healthy Daughter    Stroke Maternal Grandfather    Amblyopia Neg Hx    Blindness Neg Hx    Glaucoma Neg Hx    Macular degeneration Neg Hx    Retinal detachment Neg Hx    Strabismus Neg Hx    Retinitis pigmentosa Neg Hx    Colon cancer Neg Hx    Stomach cancer Neg Hx    Esophageal cancer Neg Hx    Pancreatic cancer Neg Hx    Liver disease Neg Hx     ROS: Otherwise negative unless mentioned in HPI  Physical Examination  Vitals:   06/22/22 0734 06/22/22 0830  BP: (!) 186/97 (!) 170/95  Pulse: (!) 118 (!) 104  Resp: (!) 22 19  Temp: (!) 103.1 F (39.5 C) (!) 101 F (38.3 C)  SpO2: 100% 100%   Body mass index is 23.4 kg/m.  General:  WDWN in NAD Gait: Not observed HENT: WNL, normocephalic Pulmonary: normal non-labored breathing, without Rales, rhonchi,  wheezing Cardiac: regular Abdomen:  soft, NT/ND, no masses Skin: without rashes Vascular Exam/Pulses: palpable L radial pulse; palpable thrill through  left arm AV graft Extremities: Painful left arm with possible abscess formation overlying AV graft with compromised skin and surrounding erythema Musculoskeletal: no muscle wasting or atrophy  Neurologic: A&O X 3;  No focal weakness or paresthesias are detected; speech is fluent/normal Psychiatric:  The pt has Normal affect. Lymph:  Unremarkable  CBC    Component Value Date/Time   WBC 13.8 (H) 06/22/2022 0825   RBC 2.99 (L) 06/22/2022 0825   HGB 8.8 (L) 06/22/2022 0825   HGB 9.7 (L) 12/22/2017 1459   HGB 13.0 07/07/2017 0000   HCT 26.7 (L) 06/22/2022 0825   HCT 28.5 (L) 12/22/2017 1459   HCT 39 07/07/2017 0000   PLT 335 06/22/2022 0825   PLT 427 12/22/2017 1459   PLT 374 07/07/2017 0000   MCV 89.3 06/22/2022 0825   MCV 82 12/22/2017 1459   MCH 29.4 06/22/2022 0825   MCHC 33.0 06/22/2022 0825   RDW 18.3 (H) 06/22/2022 0825   RDW 13.0 12/22/2017 1459   LYMPHSABS 0.9 06/22/2022 0825   MONOABS 2.2 (H) 06/22/2022 0825   EOSABS 0.0 06/22/2022 0825   BASOSABS 0.0 06/22/2022 0825    BMET    Component Value Date/Time   NA 141 12/05/2021 1956   NA 139 04/05/2019 1435   K 4.0 12/05/2021 1956   CL 96 (L) 12/05/2021 1956   CO2 30 12/05/2021 1956   GLUCOSE 148 (H) 12/05/2021 1956   BUN 38 (H) 12/05/2021 1956   BUN 31 (H) 04/05/2019 1435   CREATININE 8.73 (H) 12/05/2021 1956   CALCIUM 9.1 12/05/2021 1956   GFRNONAA 6 (L) 12/05/2021 1956   GFRAA 30 (L)  09/15/2019 0358    COAGS: Lab Results  Component Value Date   INR 1.1 07/03/2021   INR 1.0 01/24/2020     ASSESSMENT/PLAN: This is a 32 y.o. female with possible infection of left arm AV graft  -Ms. Zahaira Lowing is a 32 year old female with a left arm AV graft that was placed in July of last year.  Since Saturday morning her graft has felt warm and painful to touch with redness.  On exam she has a possible abscess formation with compromise skin overlying the graft.  She has also been febrile over the past 48 to 72 hours.   We will keep her n.p.o. with plans to go to the operating room for left arm AV graft excision due to infection.  Given her fever and white count, we will probably place a temporary line with plans to convert to [facility] at a later date.  We will take intraoperative cultures.  Blood cultures are pending.  On-call vascular surgeon Dr. Edilia Bo will evaluate the patient later today and provide further treatment plans.  Emilie Rutter PA-C [facility] [phone]  I have interviewed the patient and examined the patient. I agree with the findings by the PA.  She will require removal of her left arm graft.  Her potassium is pending.  She will likely require placement of a temporary dialysis catheter.  Cari Caraway, MD

## 2022-06-22 NOTE — Transfer of Care (Signed)
Immediate Anesthesia Transfer of Care Note  Patient: JANENE QUARTUCCIO  Procedure(s) Performed: REMOVAL OF ARTERIOVENOUS GORETEX GRAFT (AVGG) LEFT ARM WITH VEIN PATCH OF BRACHIAL ARTERY (Left: Arm Upper) INSERTION OF LEFT INTERNAL JUGULAR TRIALYSIS DIALYSIS CATHETER (Neck)  Patient Location: PACU  Anesthesia Type:General  Level of Consciousness: drowsy and patient cooperative  Airway & Oxygen Therapy: Patient Spontanous Breathing and Patient connected to nasal cannula oxygen  Post-op Assessment: Report given to RN and Post -op Vital signs reviewed and stable  Post vital signs: Reviewed and stable  Last Vitals:  Vitals Value Taken Time  BP 174/83 06/22/22 1300  Temp    Pulse 93 06/22/22 1301  Resp 21 06/22/22 1301  SpO2 98 % 06/22/22 1301  Vitals shown include unvalidated device data.  Last Pain:  Vitals:   06/22/22 1015  TempSrc:   PainSc: 10-Worst pain ever      Patients Stated Pain Goal: 2 (06/22/22 1015)  Complications: No notable events documented.

## 2022-06-22 NOTE — Progress Notes (Signed)
Pharmacy Antibiotic Note  Barbara Donovan is a 32 y.o. female with T1DM and ESRD on hemodialysis admitted on 06/22/2022 with dialysis site/fistula infection. Pharmacy has been consulted for vancomycin and cefepime dosing.  Vascular taking patient to OR today for left arm AV graft excision.Tm 103.1, wbc 13.8.   Plan: Vancomycin 1500 mg IV x1  Pharmacy will dose vancomycin as needed based on HD sessions/unstable renal function  Cefepime 2g IV x1, followed by cefepime 1g IV q24h Monitor clinical progress and cultures  F/u vascular plans & HD schedule   Height: 5\' 6"  (167.6 cm) Weight: 65.8 kg (145 lb) IBW/kg (Calculated) : 59.3  Temp (24hrs), Avg:102.1 F (38.9 C), Min:101 F (38.3 C), Max:103.1 F (39.5 C)  Recent Labs  Lab 06/22/22 0825  WBC 13.8*    CrCl cannot be calculated (Patient's most recent lab result is older than the maximum 21 days allowed.).    No Known Allergies  Antimicrobials this admission: Vancomycin 6/4 >>  Cefepime 6/4 >>   Microbiology results: 6/4 BCx: sent  Thank you for allowing pharmacy to be a part of this patient's care.  Andreas Ohm, PharmD Pharmacy Resident  06/22/2022 9:09 AM

## 2022-06-23 ENCOUNTER — Observation Stay (HOSPITAL_COMMUNITY): Payer: 59

## 2022-06-23 ENCOUNTER — Encounter (HOSPITAL_COMMUNITY): Payer: Self-pay | Admitting: Vascular Surgery

## 2022-06-23 DIAGNOSIS — E1022 Type 1 diabetes mellitus with diabetic chronic kidney disease: Secondary | ICD-10-CM | POA: Diagnosis present

## 2022-06-23 DIAGNOSIS — E875 Hyperkalemia: Secondary | ICD-10-CM | POA: Diagnosis present

## 2022-06-23 DIAGNOSIS — E211 Secondary hyperparathyroidism, not elsewhere classified: Secondary | ICD-10-CM

## 2022-06-23 DIAGNOSIS — Z79899 Other long term (current) drug therapy: Secondary | ICD-10-CM | POA: Diagnosis not present

## 2022-06-23 DIAGNOSIS — E1122 Type 2 diabetes mellitus with diabetic chronic kidney disease: Secondary | ICD-10-CM | POA: Diagnosis not present

## 2022-06-23 DIAGNOSIS — Z992 Dependence on renal dialysis: Secondary | ICD-10-CM

## 2022-06-23 DIAGNOSIS — I330 Acute and subacute infective endocarditis: Secondary | ICD-10-CM | POA: Diagnosis not present

## 2022-06-23 DIAGNOSIS — Z91158 Patient's noncompliance with renal dialysis for other reason: Secondary | ICD-10-CM | POA: Diagnosis not present

## 2022-06-23 DIAGNOSIS — R509 Fever, unspecified: Secondary | ICD-10-CM | POA: Diagnosis not present

## 2022-06-23 DIAGNOSIS — E8770 Fluid overload, unspecified: Secondary | ICD-10-CM | POA: Diagnosis present

## 2022-06-23 DIAGNOSIS — D631 Anemia in chronic kidney disease: Secondary | ICD-10-CM | POA: Diagnosis present

## 2022-06-23 DIAGNOSIS — Y832 Surgical operation with anastomosis, bypass or graft as the cause of abnormal reaction of the patient, or of later complication, without mention of misadventure at the time of the procedure: Secondary | ICD-10-CM | POA: Diagnosis present

## 2022-06-23 DIAGNOSIS — E872 Acidosis, unspecified: Secondary | ICD-10-CM | POA: Diagnosis present

## 2022-06-23 DIAGNOSIS — Z823 Family history of stroke: Secondary | ICD-10-CM | POA: Diagnosis not present

## 2022-06-23 DIAGNOSIS — N2581 Secondary hyperparathyroidism of renal origin: Secondary | ICD-10-CM | POA: Diagnosis present

## 2022-06-23 DIAGNOSIS — E871 Hypo-osmolality and hyponatremia: Secondary | ICD-10-CM | POA: Diagnosis present

## 2022-06-23 DIAGNOSIS — A4101 Sepsis due to Methicillin susceptible Staphylococcus aureus: Secondary | ICD-10-CM

## 2022-06-23 DIAGNOSIS — J45909 Unspecified asthma, uncomplicated: Secondary | ICD-10-CM | POA: Diagnosis not present

## 2022-06-23 DIAGNOSIS — M79602 Pain in left arm: Secondary | ICD-10-CM | POA: Diagnosis not present

## 2022-06-23 DIAGNOSIS — N186 End stage renal disease: Secondary | ICD-10-CM | POA: Diagnosis present

## 2022-06-23 DIAGNOSIS — E109 Type 1 diabetes mellitus without complications: Secondary | ICD-10-CM | POA: Diagnosis not present

## 2022-06-23 DIAGNOSIS — R7881 Bacteremia: Secondary | ICD-10-CM

## 2022-06-23 DIAGNOSIS — Z833 Family history of diabetes mellitus: Secondary | ICD-10-CM | POA: Diagnosis not present

## 2022-06-23 DIAGNOSIS — R911 Solitary pulmonary nodule: Secondary | ICD-10-CM | POA: Diagnosis present

## 2022-06-23 DIAGNOSIS — T827XXA Infection and inflammatory reaction due to other cardiac and vascular devices, implants and grafts, initial encounter: Secondary | ICD-10-CM | POA: Diagnosis present

## 2022-06-23 DIAGNOSIS — E10319 Type 1 diabetes mellitus with unspecified diabetic retinopathy without macular edema: Secondary | ICD-10-CM | POA: Diagnosis present

## 2022-06-23 DIAGNOSIS — I071 Rheumatic tricuspid insufficiency: Secondary | ICD-10-CM | POA: Diagnosis present

## 2022-06-23 DIAGNOSIS — Z8249 Family history of ischemic heart disease and other diseases of the circulatory system: Secondary | ICD-10-CM | POA: Diagnosis not present

## 2022-06-23 DIAGNOSIS — Z825 Family history of asthma and other chronic lower respiratory diseases: Secondary | ICD-10-CM | POA: Diagnosis not present

## 2022-06-23 DIAGNOSIS — B9561 Methicillin susceptible Staphylococcus aureus infection as the cause of diseases classified elsewhere: Secondary | ICD-10-CM

## 2022-06-23 DIAGNOSIS — Z794 Long term (current) use of insulin: Secondary | ICD-10-CM | POA: Diagnosis not present

## 2022-06-23 DIAGNOSIS — I088 Other rheumatic multiple valve diseases: Secondary | ICD-10-CM | POA: Diagnosis not present

## 2022-06-23 DIAGNOSIS — I120 Hypertensive chronic kidney disease with stage 5 chronic kidney disease or end stage renal disease: Secondary | ICD-10-CM | POA: Diagnosis present

## 2022-06-23 LAB — ECHOCARDIOGRAM COMPLETE
AV Mean grad: 6 mmHg
AV Peak grad: 11.3 mmHg
Ao pk vel: 1.68 m/s
Area-P 1/2: 4.08 cm2
Height: 66 in
S' Lateral: 3.5 cm
Weight: 2320.02 oz

## 2022-06-23 LAB — GLUCOSE, CAPILLARY
Glucose-Capillary: 87 mg/dL (ref 70–99)
Glucose-Capillary: 100 mg/dL — ABNORMAL HIGH (ref 70–99)
Glucose-Capillary: 129 mg/dL — ABNORMAL HIGH (ref 70–99)
Glucose-Capillary: 169 mg/dL — ABNORMAL HIGH (ref 70–99)

## 2022-06-23 LAB — CBC
HCT: 24.9 % — ABNORMAL LOW (ref 36.0–46.0)
Hemoglobin: 8.3 g/dL — ABNORMAL LOW (ref 12.0–15.0)
MCH: 29.7 pg (ref 26.0–34.0)
MCHC: 33.3 g/dL (ref 30.0–36.0)
MCV: 89.2 fL (ref 80.0–100.0)
Platelets: 360 10*3/uL (ref 150–400)
RBC: 2.79 MIL/uL — ABNORMAL LOW (ref 3.87–5.11)
RDW: 18 % — ABNORMAL HIGH (ref 11.5–15.5)
WBC: 9.1 10*3/uL (ref 4.0–10.5)
nRBC: 0 % (ref 0.0–0.2)

## 2022-06-23 LAB — CULTURE, BLOOD (ROUTINE X 2): Culture: NO GROWTH

## 2022-06-23 LAB — RENAL FUNCTION PANEL
Albumin: 2.8 g/dL — ABNORMAL LOW (ref 3.5–5.0)
Anion gap: 20 — ABNORMAL HIGH (ref 5–15)
BUN: 58 mg/dL — ABNORMAL HIGH (ref 6–20)
CO2: 20 mmol/L — ABNORMAL LOW (ref 22–32)
Calcium: 8.9 mg/dL (ref 8.9–10.3)
Chloride: 91 mmol/L — ABNORMAL LOW (ref 98–111)
Creatinine, Ser: 9.84 mg/dL — ABNORMAL HIGH (ref 0.44–1.00)
GFR, Estimated: 5 mL/min — ABNORMAL LOW (ref >60)
Glucose, Bld: 87 mg/dL (ref 70–99)
Phosphorus: 4.3 mg/dL (ref 2.5–4.6)
Potassium: 4.1 mmol/L (ref 3.5–5.1)
Sodium: 131 mmol/L — ABNORMAL LOW (ref 135–145)

## 2022-06-23 LAB — AEROBIC/ANAEROBIC CULTURE W GRAM STAIN (SURGICAL/DEEP WOUND)

## 2022-06-23 LAB — HEPATITIS B SURFACE ANTIBODY, QUANTITATIVE: Hep B S AB Quant (Post): 28 m[IU]/mL (ref >9.9)

## 2022-06-23 LAB — HEMOGLOBIN A1C
Hgb A1c MFr Bld: 5.8 % — ABNORMAL HIGH (ref 4.8–5.6)
Mean Plasma Glucose: 120 mg/dL

## 2022-06-23 MED ORDER — SODIUM CHLORIDE 0.9 % IV SOLN: INTRAVENOUS | Status: DC | PRN

## 2022-06-23 MED ORDER — AMLODIPINE BESYLATE 10 MG PO TABS
10.0000 mg | ORAL_TABLET | Freq: Every day | ORAL | Status: DC
  Administered 2022-06-23 – 2022-06-27 (×5): 10 mg via ORAL
  Filled 2022-06-23 (×6): qty 1

## 2022-06-23 MED ORDER — HEPARIN SODIUM (PORCINE) 1000 UNIT/ML IJ SOLN
INTRAMUSCULAR | Status: AC
  Administered 2022-06-23: 5000 [IU] via INTRAVENOUS_CENTRAL
  Filled 2022-06-23: qty 5

## 2022-06-23 MED ORDER — CARVEDILOL 25 MG PO TABS
25.0000 mg | ORAL_TABLET | Freq: Two times a day (BID) | ORAL | Status: DC
  Administered 2022-06-23 – 2022-06-29 (×11): 25 mg via ORAL
  Filled 2022-06-23 (×12): qty 1

## 2022-06-23 MED ORDER — RENA-VITE PO TABS
1.0000 | ORAL_TABLET | Freq: Every day | ORAL | Status: DC
  Administered 2022-06-23 – 2022-06-27 (×5): 1 via ORAL
  Filled 2022-06-23 (×6): qty 1

## 2022-06-23 MED ORDER — SUCROFERRIC OXYHYDROXIDE 500 MG PO CHEW
1500.0000 mg | CHEWABLE_TABLET | Freq: Three times a day (TID) | ORAL | Status: DC
  Administered 2022-06-23 – 2022-06-29 (×12): 1500 mg via ORAL
  Filled 2022-06-23 (×11): qty 3

## 2022-06-23 NOTE — Progress Notes (Signed)
  Echocardiogram 2D Echocardiogram has been performed.  Blair Hailey 06/23/2022, 10:25 AM

## 2022-06-23 NOTE — TOC Initial Note (Addendum)
Transition of Care [facility]) - Initial/Assessment Note    Patient Details  Name: Barbara Donovan MRN: [id number] Date of Birth: 1990/12/23  [facility]) CM/SW Contact:    Tom-Johnson, Hershal Coria, RN Phone Number: 06/23/2022, 12:05 PM  Clinical Narrative:                  CM spoke with patient at bedside about needs for post hospital transition. Admitted with Pain around Lt arm Fistula and Fever.  Patient was taken to the OR for removal of Graft material and temporary Dialysis Catheter placed. Patient had inpatient HD yesterday 06/22/22. Patient's Blood cx was positive and ID consulted. On IV abx.   Vascular Sx and Nephrology Following.  From home with boyfriend and son. Does not have parents, has one supportive sister. Currently on disability. Does Outpatient dialysis at home on MTTF schedule. Independent with care and drive self prior to admission. Does not have DME's at home.  PCP is Dellis Filbert, MD and uses [facility] [address].  No TOC needs or recommendations noted at this time. CM will continue to follow as patient progresses with care towards discharge.      Expected Discharge Plan: Home/Self Care Barriers to Discharge: Continued Medical Work up   Patient Goals and CMS Choice Patient states their goals for this hospitalization and ongoing recovery are:: To return home CMS Medicare.gov Compare Post Acute Care list provided to:: Patient Choice offered to / list presented to : NA      Expected Discharge Plan and Services   Discharge Planning Services: CM Consult Post Acute Care Choice: NA Living arrangements for the past 2 months: Apartment                 DME Arranged: N/A DME Agency: NA       HH Arranged: NA HH Agency: NA        Prior Living Arrangements/Services Living arrangements for the past 2 months: Apartment Lives with:: Significant Other, Minor Children, Other (Comment) (Son) Patient language and need for interpreter  reviewed:: Yes Do you feel safe going back to the place where you live?: Yes      Need for Family Participation in Patient Care: Yes (Comment) Care giver support system in place?: Yes (comment)   Criminal Activity/Legal Involvement Pertinent to Current Situation/Hospitalization: No - Comment as needed  Activities of Daily Living Home Assistive Devices/Equipment: None ADL Screening (condition at time of admission) Patient's cognitive ability adequate to safely complete daily activities?: Yes Is the patient deaf or have difficulty hearing?: No Does the patient have difficulty seeing, even when wearing glasses/contacts?: No Does the patient have difficulty concentrating, remembering, or making decisions?: No Patient able to express need for assistance with ADLs?: Yes Does the patient have difficulty dressing or bathing?: No Independently performs ADLs?: Yes (appropriate for developmental age) Does the patient have difficulty walking or climbing stairs?: No Weakness of Legs: None Weakness of Arms/Hands: None  Permission Sought/Granted Permission sought to share information with : Case Manager, Family Supports Permission granted to share information with : Yes, Verbal Permission Granted              Emotional Assessment Appearance:: Appears stated age Attitude/Demeanor/Rapport: Engaged, Gracious Affect (typically observed): Accepting, Appropriate, Calm, Hopeful, Pleasant Orientation: : Oriented to Self, Oriented to Place, Oriented to  Time, Oriented to Situation Alcohol / Substance Use: Not Applicable Psych Involvement: No (comment)  Admission diagnosis:  Infection of arteriovenous fistula, initial encounter (HCC) [Q65.7XXA]  Sepsis, due to unspecified organism, unspecified whether acute organ dysfunction present (HCC) [A41.9] 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) [T82.7XXA] Patient Active Problem List   Diagnosis Date Noted   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/2022    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) 10/13/2021   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) 10/13/2021   Greater trochanteric pain syndrome of right lower extremity 07/23/2021   MSSA bacteremia 07/04/2021   Hyperlipidemia 04/20/2021   Anxiety and depression 02/04/2021   DKA, type 1 (HCC) 11/10/2020   Type 1 diabetes mellitus with hyperglycemia (HCC) 11/10/2020   ESRD on hemodialysis (HCC) 11/10/2020   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 AT [address] [place] [address] Phone: [phone] Fax: [phone]     Social Determinants of Health (SDOH) Social History: SDOH Screenings   Food Insecurity: No Food Insecurity (06/22/2022)  Housing: Low Risk  (06/22/2022)  Transportation Needs: No Transportation Needs (06/22/2022)  Utilities: Not At Risk (06/22/2022)  Depression (PHQ2-9): Low Risk  (07/23/2021)  Physical Activity: Inactive (03/22/2019)  Stress: No Stress Concern Present (03/22/2019)  Tobacco Use: Low Risk  (06/23/2022)   SDOH Interventions: Transportation Interventions: Intervention Not Indicated, Inpatient TOC, Patient Resources (Friends/Family)   Readmission Risk Interventions    06/09/2020    1:04 PM  Readmission Risk Prevention Plan  Transportation Screening Complete  PCP or Specialist appointment within 3-5 days of discharge Complete  HRI or Home Care Consult Complete  SW Recovery Care/Counseling Consult Complete  Palliative Care Screening Not Applicable  Skilled Nursing Facility Not Applicable

## 2022-06-23 NOTE — Progress Notes (Signed)
Received patient in bed to unit.  Alert and oriented.  Informed consent signed and in chart. yes  TX duration:3.30  Patient tolerated well. yes Transported back to the room yes Alert, without acute distress.  Hand-off given to patient's nurse. [name]  Access used: RIJ Access issues: none  Total UF removed: Medication(s) given: heparin Post HD VS: 160/90 94 Post HD weight: 81.8   [facility]

## 2022-06-23 NOTE — Progress Notes (Signed)
[facility] Progress Note   Subjective: Seen in room. Says she is very tired. S/P removal of  infected AVG per Dr. Edilia Bo 06/22/2022. Appreciate VVS assistance. Says she is tired. Denies SOB. Temp catheter [facility].    Objective Vitals:   06/23/22 0431 06/23/22 0501 06/23/22 0608 06/23/22 1000  BP: (!) 162/87 (!) 160/93 (!) 143/79 (!) 140/78  Pulse: 89 98 100 91  Resp:   14 19  Temp:  98.7 F (37.1 C) 99.7 F (37.6 C) 98.9 F (37.2 C)  TempSrc:  Oral Oral Oral  SpO2: 100% 99% 100% 100%  Weight:      Height:       Physical Exam General: Chronically ill appearing female in NAD Heart: S1,S2 RRR No M/R/G Lungs: CTAB A/P. No WOB.  Abdomen: NABS, NT Extremities: No LE edema Dialysis Access: LIJ Temp catheter drsg intact   Additional Objective Labs: Basic Metabolic Panel: Recent Labs  Lab 06/22/22 0825 06/23/22 0803  NA 127* 131*  K 5.7* 4.1  CL 85* 91*  CO2 16* 20*  GLUCOSE 132* 87  BUN 135* 58*  CREATININE 19.02* 9.84*  CALCIUM 9.3 8.9  PHOS  --  4.3   Liver Function Tests: Recent Labs  Lab 06/22/22 0825 06/23/22 0803  AST 20  --   ALT 33  --   ALKPHOS 99  --   BILITOT 0.8  --   PROT 7.2  --   ALBUMIN 3.2* 2.8*   No results for input(s): "LIPASE", "AMYLASE" in the last 168 hours. CBC: Recent Labs  Lab 06/22/22 0825 06/23/22 0803  WBC 13.8* 9.1  NEUTROABS 10.7*  --   HGB 8.8* 8.3*  HCT 26.7* 24.9*  MCV 89.3 89.2  PLT 335 360   Blood Culture    Component Value Date/Time   SDES BLOOD RIGHT FOREARM 06/23/2022 0805   SPECREQUEST  06/23/2022 0805    BOTTLES DRAWN AEROBIC AND ANAEROBIC Blood Culture adequate volume   CULT  06/23/2022 0805    NO GROWTH < 12 HOURS Performed at [facility], [address], [place], [place] [address]    REPTSTATUS PENDING 06/23/2022 6045    Cardiac Enzymes: No results for input(s): "CKTOTAL", "CKMB", "CKMBINDEX", "TROPONINI" in the last 168 hours. CBG: Recent Labs  Lab 06/22/22 0918  06/22/22 1059 06/22/22 1301 06/23/22 0723  GLUCAP 122* 108* 116* 87   Iron Studies: No results for input(s): "IRON", "TIBC", "TRANSFERRIN", "FERRITIN" in the last 72 hours. @lablastinr3 @ Studies/Results: ECHOCARDIOGRAM COMPLETE  Result Date: 06/23/2022    ECHOCARDIOGRAM REPORT   Patient Name:   LASHUN FREEMAN Date of Exam: 06/23/2022 Medical Rec #:  [id number]        Height:       66.0 in Accession #:    [id number]       Weight:       145.0 lb Date of Birth:  28-Apr-1990        BSA:          1.744 m Patient Age:    31 years         BP:           143/79 mmHg Patient Gender: F                HR:           86 bpm. Exam Location:  Inpatient Procedure: 2D Echo, Cardiac Doppler and Color Doppler Indications:    Bacteremia  History:  Patient has prior history of Echocardiogram examinations, most                 recent 07/05/2021. Risk Factors:Diabetes and Hypertension.  Sonographer:    Darlys Gales Referring Phys: Lisette Grinder [facility] [place] IMPRESSIONS  1. Left ventricular ejection fraction, by estimation, is 55 to 60%. The left ventricle has normal function. The left ventricle has no regional wall motion abnormalities. There is mild concentric left ventricular hypertrophy. Left ventricular diastolic parameters were normal.  2. Right ventricular systolic function is normal. The right ventricular size is normal.  3. The mitral valve is normal in structure. Trivial mitral valve regurgitation. No evidence of mitral stenosis.  4. The aortic valve is normal in structure. Aortic valve regurgitation is not visualized. No aortic stenosis is present.  5. The inferior vena cava is normal in size with greater than 50% respiratory variability, suggesting right atrial pressure of 3 mmHg. Conclusion(s)/Recommendation(s): No evidence of valvular vegetations on this transthoracic echocardiogram. Consider a transesophageal echocardiogram to exclude infective endocarditis if clinically indicated. FINDINGS  Left Ventricle: Left  ventricular ejection fraction, by estimation, is 55 to 60%. The left ventricle has normal function. The left ventricle has no regional wall motion abnormalities. The left ventricular internal cavity size was normal in size. There is  mild concentric left ventricular hypertrophy. Left ventricular diastolic parameters were normal. Normal left ventricular filling pressure. Right Ventricle: The right ventricular size is normal. No increase in right ventricular wall thickness. Right ventricular systolic function is normal. Left Atrium: Left atrial size was normal in size. Right Atrium: Right atrial size was normal in size. Pericardium: Trivial pericardial effusion is present. The pericardial effusion is posterior to the left ventricle. Mitral Valve: The mitral valve is normal in structure. Trivial mitral valve regurgitation. No evidence of mitral valve stenosis. Tricuspid Valve: The tricuspid valve is normal in structure. Tricuspid valve regurgitation is trivial. No evidence of tricuspid stenosis. Aortic Valve: The aortic valve is normal in structure. Aortic valve regurgitation is not visualized. No aortic stenosis is present. Aortic valve mean gradient measures 6.0 mmHg. Aortic valve peak gradient measures 11.3 mmHg. Pulmonic Valve: The pulmonic valve was normal in structure. Pulmonic valve regurgitation is trivial. No evidence of pulmonic stenosis. Aorta: The aortic root is normal in size and structure. Venous: The inferior vena cava is normal in size with greater than 50% respiratory variability, suggesting right atrial pressure of 3 mmHg. IAS/Shunts: No atrial level shunt detected by color flow Doppler.  LEFT VENTRICLE PLAX 2D LVIDd:         4.90 cm Diastology LVIDs:         3.50 cm LV e' medial:    8.81 cm/s LV PW:         1.30 cm LV E/e' medial:  9.0 LV IVS:        1.20 cm LV e' lateral:   11.50 cm/s                        LV E/e' lateral: 6.9  RIGHT VENTRICLE RV S prime:     14.10 cm/s TAPSE (M-mode): 2.4 cm LEFT  ATRIUM             Index        RIGHT ATRIUM           Index LA Vol (A2C):   44.8 ml 25.68 ml/m  RA Area:     14.20 cm LA Vol (A4C):   42.9  ml 24.59 ml/m  RA Volume:   30.90 ml  17.71 ml/m LA Biplane Vol: 45.0 ml 25.80 ml/m  AORTIC VALVE AV Vmax:           168.00 cm/s AV Vmean:          113.000 cm/s AV VTI:            0.285 m AV Peak Grad:      11.3 mmHg AV Mean Grad:      6.0 mmHg LVOT Vmax:         154.00 cm/s LVOT Vmean:        108.000 cm/s LVOT VTI:          0.282 m LVOT/AV VTI ratio: 0.99  AORTA Ao Root diam: 2.50 cm MITRAL VALVE MV Area (PHT): 4.08 cm    SHUNTS MV Decel Time: 186 msec    Systemic VTI: 0.28 m MV E velocity: 79.70 cm/s MV A velocity: 59.20 cm/s MV E/A ratio:  1.35 Armanda Magic MD Electronically signed by Armanda Magic MD Signature Date/Time: 06/23/2022/10:51:38 AM    Final    DG Chest Port 1 View  Result Date: 06/22/2022 CLINICAL DATA:  Status post insertion of dialysis catheter. EXAM: PORTABLE CHEST 1 VIEW COMPARISON:  Chest radiograph earlier today FINDINGS: Dual lumen left internal jugular dialysis catheter tip is in the right atrium. No pneumothorax. Overall low lung volumes. Stable cardiomegaly. Mediastinal contours are unchanged. The faint right upper lobe opacity is not well delineated on the current exam, with no definite nodular component. There may be small bilateral pleural effusions. IMPRESSION: 1. Tip of the dual lumen left internal jugular dialysis catheter is in the right atrium. No pneumothorax. 2. Stable cardiomegaly. 3. The previous right upper lobe nodular density is faintly visualized on the current exam. Electronically Signed   By: Narda Rutherford M.D.   On: 06/22/2022 16:50   DG C-Arm 1-60 Min-No Report  Result Date: 06/22/2022 Fluoroscopy was utilized by the requesting physician.  No radiographic interpretation.   DG Chest 2 View  Result Date: 06/22/2022 CLINICAL DATA:  Rule out sepsis. EXAM: CHEST - 2 VIEW COMPARISON:  07/02/2021. FINDINGS: New nodular  opacity in the right upper lobe. No consolidation or pulmonary edema. Stable cardiac and mediastinal contours. No pleural effusion or pneumothorax. Visualized bones and upper abdomen are unremarkable. IMPRESSION: New nodular opacity in the right upper lobe, which may be infectious, inflammatory or neoplastic in etiology. Recommend follow-up chest radiograph in 4-6 weeks to ensure resolution. Electronically Signed   By: Orvan Falconer M.D.   On: 06/22/2022 08:13   Medications:   ceFAZolin (ANCEF) IV      calcitRIOL  0.5 mcg Oral Daily   Chlorhexidine Gluconate Cloth  6 each Topical Q0600   cinacalcet  30 mg Oral Q supper   [START ON 06/29/2022] darbepoetin (ARANESP) injection - DIALYSIS  100 mcg Subcutaneous Q Tue-1800   heparin  5,000 Units Subcutaneous Q8H   insulin aspart  0-6 Units Subcutaneous TID WC   insulin glargine-yfgn  9 Units Subcutaneous Daily   sevelamer carbonate  1,600 mg Oral TID WC   OP HD orders: HHD  MTTF Dialyzes at [facility] HD,  3 hrs, 4x/week, EDW 63.4Kg. HD Bath 2k, BFR 400. Access: LUE AVG removed on 6/4. Mircera 150 mcg on 5/14 Calcitriol 0.5 mcg daily Sensipar 30 mg daily Velphoro 3 tabs w/meal   Assessment/Plan:   # Infected left AV graft with sepsis: Status post removal of infected left AV graft  by vascular surgeon on 6/4.  Pending culture.  Currently on vancomycin and cefepime. Going for [facility] 06/24/2022.    # ESRD on HHD 4x/week: Last dialysis about 5 days ago and labs abnormal. HD 06/22/2022 finished around 5 AM today. Will use T,Th,S schedule while in hospital. Next HD 06/24/2022 Once infection clears she will need tunneled HD catheter placement.  I discussed with the patient that she may need to do in center HD with the catheter while awaiting new perm access because of increased risk of infection.  Renal navigator notified for CLIP.    # Hypertension/volume overload: Resume home medication. HD last PM.  Net UF 3 liters. Still above OP EDW. Continue to  lower EDW as tolerated.   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 Aranesp 100 mcg SQ. Follow HGB. No iron D/T infection.   # Secondary hyperparathyroidism/hyperphosphatemia: Resume calcitriol, Sensipar and binders.   # Hypervolemic hyponatremia, hyperkalemia, acidosis all due to incomplete/missing dialysis.  May need another session of dialysis tomorrow.  Monitor lab.   Luisfernando Brightwell H. Raychell Holcomb NP-C 06/23/2022, 11:23 AM  [employer] [phone]

## 2022-06-23 NOTE — Progress Notes (Signed)
   06/23/22 0501  Vitals  Temp 98.7 F (37.1 C)  Temp Source Oral  BP (!) 160/93  Pulse Rate 98  Pulse Rate Source Monitor  Oxygen Therapy  SpO2 99 %  O2 Device Room Air  Pulse Oximetry Type Continuous  During Treatment Monitoring  Intra-Hemodialysis Comments Tx completed   Dialysis treatment ,3hrs 30 minutes mid run cartridge changed, heparin instill in RIJ cath ports 1.23ml. net UF report given to Velora Heckler

## 2022-06-23 NOTE — Consult Note (Signed)
Date of Admission:  06/22/2022          Reason for Consult: MSSA bacteremia due to AVG infection    Referring Provider: CHAMP auto consult and Debe Coder, MD   Assessment:  MSSA bacteremia due to AVG infection sp removal TEmporary HD catheter placed when she was bacteremic IDDM HTN  Plan:  Contnue cefazolin Repeat blood cultures taken today .Marland KitchenBUT  She needs her temp HD catheter removed and to have a "a catheter holiday" with blood cultures taken again after her temporary catheters been removed showing no growth for at least 2 days prior to consideration of placement of permanent catheter TTE and consider TEE  Principal Problem:   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 female w ESRD on HD who had prior catheter associated MSSA bacteremia treated last June of 2023, had blood cultures taken on admission was started on antibiotics she underwent vascular surgery yesterday on the fourth with removal of her infected left upper AV graft and vein patch angioplasty of the left brachial artery with ultrasound-guided placement of temporary dialysis catheter in the left internal jugular vein.  Blood cultures have simply come back positive for MSSA.  She has been narrowed to cefazolin.  I have ordered 2D echocardiogram.   SHE SHOULD NOT HAVE ANY PERMANENT HD catheter placed until she has an HD catheter "holiday" and we  have cleared her remade with blood cultures taken after HD catheter removal showing no growth  Monitor for metastatic sites for infection.  Could consider TEE but with AVG infection I would err on treating for 6 weeks regardless  I have personally spent 82 minutes involved in face-to-face and non-face-to-face activities for this patient on the day of the visit. Professional time spent includes the following activities: Preparing to see the patient (review of tests), Obtaining and/or reviewing separately obtained history (admission/discharge record), Performing a medically appropriate examination and/or evaluation , Ordering medications/tests/procedures, referring and communicating with other health care professionals, Documenting clinical information in the EMR, Independently interpreting results (not separately reported), Communicating results to the patient/family/caregiver, Counseling and educating the patient/family/caregiver and Care coordination (not separately reported).     Review of Systems: Review of Systems  Constitutional:  Positive for fever. Negative for chills, malaise/fatigue and weight loss.  HENT:  Negative for congestion and sore throat.   Eyes:  Negative for blurred vision and photophobia.  Respiratory:  Negative for cough, shortness of breath and wheezing.   Cardiovascular:  Negative for chest pain, palpitations and leg swelling.  Gastrointestinal:  Negative for abdominal pain, blood in stool, constipation, diarrhea, heartburn, melena, nausea and vomiting.  Genitourinary:  Negative for dysuria, flank pain and hematuria.  Musculoskeletal:  Negative for back pain, falls, joint pain and myalgias.  Skin:  Negative for itching and rash.  Neurological:  Negative for dizziness,  focal weakness, loss of consciousness, weakness and headaches.  Endo/Heme/Allergies:  Does not bruise/bleed easily.  Psychiatric/Behavioral:  Negative for depression and suicidal ideas. The patient does  not have insomnia.     Past Medical History:  Diagnosis Date   Asthma    as a child, no problems as an adult, no inhaler   Cataract    NS OU   Chronic hypertension during pregnancy, antepartum 08/19/2017   Dehydration 01/28/2018   Depression during pregnancy, antepartum 07/07/2017   6/20: Short trial of zoloft previously, reports didn't help much but also didn't give it a chance Discussed r/b/a SSRIs in pregnancy, agrees to try Zoloft again, rx sent No SI/HI/red flags   Diabetes (HCC)    TYPE I. 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 A2/B GDM)  Required Referrals for A1GDM or A2GDM: [x]  Diabetes Education and Testing Supplies [x]  Nutrition Cousult  For A2/B GDM or higher classes of DM [x]  Diabetes Education and Testing Supplies [x]  Nutrition Counsult [x]  Fetal ECHO after 20 weeks  [x]  Eye exam for retina evaluation - severe retinopathy 7/19  Base   Ventricular septal defect (VSD) of fetus in singleton pregnancy, antepartum 09/30/2017   May go to newborn nursery per Dr. Grace Bing Echo prior to discharge    Social History   Tobacco Use   Smoking status: Never   Smokeless tobacco: Never  Vaping Use   Vaping Use: Never used  Substance Use Topics   Alcohol use: Not Currently   Drug use: Never    Family History  Problem Relation Age of Onset   Diabetes Mother    Aneurysm Mother  4   Seizures Mother    Diabetes Father    Cataracts Father    COPD Father    Heart attack Father    Heart disease Father    Healthy Sister    Healthy Daughter    Stroke Maternal Grandfather    Amblyopia Neg Hx    Blindness Neg Hx    Glaucoma Neg Hx    Macular degeneration Neg Hx    Retinal detachment Neg Hx    Strabismus Neg Hx    Retinitis pigmentosa Neg Hx    Colon cancer Neg Hx    Stomach cancer Neg Hx    Esophageal cancer Neg Hx    Pancreatic cancer Neg Hx    Liver disease Neg Hx    No Known Allergies  OBJECTIVE: Blood pressure (!) 140/78, pulse 91, temperature 98.9 F (37.2 C), temperature source Oral, resp. rate 19, height 5\' 6"  (1.676 m), weight 65.8 kg, SpO2 100 %.  Physical Exam Constitutional:      General: She is not in acute distress.    Appearance: Normal appearance. She is well-developed. She is not ill-appearing or diaphoretic.  HENT:     Head: Normocephalic and atraumatic.     Right Ear: Hearing and external ear normal.  Left Ear: Hearing and external ear normal.     Nose: No nasal deformity or rhinorrhea.  Eyes:     General: No scleral icterus.    Conjunctiva/sclera: Conjunctivae normal.     Right eye: Right conjunctiva is not injected.     Left eye: Left conjunctiva is not injected.     Pupils: Pupils are equal, round, and reactive to light.  Neck:     Vascular: No JVD.  Cardiovascular:     Rate and Rhythm: Normal rate and regular rhythm.     Heart sounds: Normal heart sounds, S1 normal and S2 normal. No murmur heard.    No friction rub. No gallop.  Pulmonary:     Effort: Pulmonary effort is normal. No respiratory distress.     Breath sounds: No stridor. No wheezing, rhonchi or rales.  Abdominal:     General: Bowel sounds are normal. There is no distension.     Palpations: Abdomen is soft.     Tenderness: There is no abdominal tenderness.  Musculoskeletal:        General: Normal range of motion.     Right shoulder: Normal.     Left  shoulder: Normal.     Cervical back: Normal range of motion and neck supple.     Right hip: Normal.     Left hip: Normal.     Right knee: Normal.     Left knee: Normal.  Lymphadenopathy:     Head:     Right side of head: No submandibular, preauricular or posterior auricular adenopathy.     Left side of head: No submandibular, preauricular or posterior auricular adenopathy.     Cervical: No cervical adenopathy.     Right cervical: No superficial or deep cervical adenopathy.    Left cervical: No superficial or deep cervical adenopathy.  Skin:    General: Skin is warm and dry.     Coloration: Skin is not pale.     Findings: No abrasion, bruising, ecchymosis, erythema, lesion or rash.     Nails: There is no clubbing.  Neurological:     General: No focal deficit present.     Mental Status: She is alert and oriented to person, place, and time.     Sensory: No sensory deficit.     Coordination: Coordination normal.     Gait: Gait normal.  Psychiatric:        Attention and Perception: She is attentive.        Mood and Affect: Mood normal.        Speech: Speech normal.        Behavior: Behavior normal. Behavior is cooperative.        Thought Content: Thought content normal.        Judgment: Judgment normal.     Lab Results Lab Results  Component Value Date   WBC 9.1 06/23/2022   HGB 8.3 (L) 06/23/2022   HCT 24.9 (L) 06/23/2022   MCV 89.2 06/23/2022   PLT 360 06/23/2022    Lab Results  Component Value Date   CREATININE 9.84 (H) 06/23/2022   BUN 58 (H) 06/23/2022   NA 131 (L) 06/23/2022   K 4.1 06/23/2022   CL 91 (L) 06/23/2022   CO2 20 (L) 06/23/2022    Lab Results  Component Value Date   ALT 33 06/22/2022   AST 20 06/22/2022   ALKPHOS 99 06/22/2022   BILITOT 0.8 06/22/2022     Microbiology: Recent Results (from the past 240  hour(s))  Culture, blood (Routine x 2)     Status: Abnormal (Preliminary result)   Collection Time: 06/22/22  7:37 AM   Specimen: BLOOD  RIGHT ARM  Result Value Ref Range Status   Specimen Description BLOOD RIGHT ARM  Final   Special Requests   Final    BOTTLES DRAWN AEROBIC AND ANAEROBIC Blood Culture adequate volume   Culture  Setup Time   Final    GRAM POSITIVE COCCI IN CLUSTERS IN BOTH AEROBIC AND ANAEROBIC BOTTLES CRITICAL RESULT CALLED TO, READ BACK BY AND VERIFIED WITH: PHARMD JAMES LEDFORD ON 06/22/22 @ 2239 BY DRT    Culture (A)  Final    STAPHYLOCOCCUS AUREUS SUSCEPTIBILITIES TO FOLLOW Performed at [facility], [address], [place], [place] [address]    Report Status PENDING  Incomplete  Blood Culture ID Panel (Reflexed)     Status: Abnormal   Collection Time: 06/22/22  7:37 AM  Result Value Ref Range Status   Enterococcus faecalis NOT DETECTED NOT DETECTED Final   Enterococcus Faecium NOT DETECTED NOT DETECTED Final   Listeria monocytogenes NOT DETECTED NOT DETECTED Final   Staphylococcus species DETECTED (A) NOT DETECTED Final    Comment: CRITICAL RESULT CALLED TO, READ BACK BY AND VERIFIED WITH: PHARMD JAMES LEDFORD ON 06/22/22 @ 2239 BY DRT    Staphylococcus aureus (BCID) DETECTED (A) NOT DETECTED Final    Comment: CRITICAL RESULT CALLED TO, READ BACK BY AND VERIFIED WITH: PHARMD JAMES LEDFORD ON 06/22/22 @ 2239 BY DRT    Staphylococcus epidermidis NOT DETECTED NOT DETECTED Final   Staphylococcus lugdunensis NOT DETECTED NOT DETECTED Final   Streptococcus species NOT DETECTED NOT DETECTED Final   Streptococcus agalactiae NOT DETECTED NOT DETECTED Final   Streptococcus pneumoniae NOT DETECTED NOT DETECTED Final   Streptococcus pyogenes NOT DETECTED NOT DETECTED Final   A.calcoaceticus-baumannii NOT DETECTED NOT DETECTED Final   Bacteroides fragilis NOT DETECTED NOT DETECTED Final   Enterobacterales NOT DETECTED NOT DETECTED Final   Enterobacter cloacae complex NOT DETECTED NOT DETECTED Final   Escherichia coli NOT DETECTED NOT DETECTED Final   Klebsiella aerogenes NOT DETECTED NOT DETECTED  Final   Klebsiella oxytoca NOT DETECTED NOT DETECTED Final   Klebsiella pneumoniae NOT DETECTED NOT DETECTED Final   Proteus species NOT DETECTED NOT DETECTED Final   Salmonella species NOT DETECTED NOT DETECTED Final   Serratia marcescens NOT DETECTED NOT DETECTED Final   Haemophilus influenzae NOT DETECTED NOT DETECTED Final   Neisseria meningitidis NOT DETECTED NOT DETECTED Final   Pseudomonas aeruginosa NOT DETECTED NOT DETECTED Final   Stenotrophomonas maltophilia NOT DETECTED NOT DETECTED Final   Candida albicans NOT DETECTED NOT DETECTED Final   Candida auris NOT DETECTED NOT DETECTED Final   Candida glabrata NOT DETECTED NOT DETECTED Final   Candida krusei NOT DETECTED NOT DETECTED Final   Candida parapsilosis NOT DETECTED NOT DETECTED Final   Candida tropicalis NOT DETECTED NOT DETECTED Final   Cryptococcus neoformans/gattii NOT DETECTED NOT DETECTED Final   Meth resistant mecA/C and MREJ NOT DETECTED NOT DETECTED Final    Comment: Performed at [facility], [address] [place], [place] [address]  Aerobic/Anaerobic Culture w Gram Stain (surgical/deep wound)     Status: None (Preliminary result)   Collection Time: 06/22/22 10:52 AM   Specimen: Wound; Body Fluid  Result Value Ref Range Status   Specimen Description FLUID  Final   Special Requests LEFT ARM PERI GRAFT  Final   Gram Stain  Final    MODERATE WBC PRESENT, PREDOMINANTLY PMN RARE SQUAMOUS EPITHELIAL CELLS PRESENT FEW GRAM POSITIVE COCCI    Culture   Final    FEW STAPHYLOCOCCUS AUREUS SUSCEPTIBILITIES TO FOLLOW Performed at [facility], [address], [place], [place] [address]    Report Status PENDING  Incomplete  Culture, blood (Routine X 2) w Reflex to ID Panel     Status: None (Preliminary result)   Collection Time: 06/23/22  8:03 AM   Specimen: BLOOD  Result Value Ref Range Status   Specimen Description BLOOD RIGHT ANTECUBITAL  Final   Special Requests   Final    BOTTLES DRAWN AEROBIC  AND ANAEROBIC Blood Culture results may not be optimal due to an inadequate volume of blood received in culture bottles   Culture   Final    NO GROWTH < 12 HOURS Performed at [facility], [address] [place], [place] [address]    Report Status PENDING  Incomplete  Culture, blood (Routine X 2) w Reflex to ID Panel     Status: None (Preliminary result)   Collection Time: 06/23/22  8:05 AM   Specimen: BLOOD RIGHT FOREARM  Result Value Ref Range Status   Specimen Description BLOOD RIGHT FOREARM  Final   Special Requests   Final    BOTTLES DRAWN AEROBIC AND ANAEROBIC Blood Culture adequate volume   Culture   Final    NO GROWTH < 12 HOURS Performed at [facility], [address], [place], [place] [address]    Report Status PENDING  Incomplete    Acey Lav, MD [facility] [phone] pager  06/23/2022, 11:52 AM

## 2022-06-23 NOTE — Progress Notes (Signed)
  VASCULAR SURGERY ASSESSMENT & PLAN:   POD 1 REMOVAL OF INFECTED LEFT UPPER ARM GRAFT/PLACEMENT TEMPORARY DIALYSIS CATHETER: Begin daily dressing changes to left arm wound.  She is scheduled for placement of a tunneled dialysis catheter tomorrow.  RENAL: The patient is scheduled for placement of a tunneled dialysis catheter tomorrow a.m.  Please be sure that the patient is not dialyzed tomorrow morning as this will interfere with the OR schedule.  SUBJECTIVE:   No complaints this morning.  PHYSICAL EXAM:   Vitals:   06/23/22 0400 06/23/22 0431 06/23/22 0501 06/23/22 0608  BP: (!) 156/88 (!) 162/87 (!) 160/93 (!) 143/79  Pulse: 87 89 98 100  Resp:    14  Temp:   98.7 F (37.1 C) 99.7 F (37.6 C)  TempSrc:   Oral Oral  SpO2: 100% 100% 99% 100%  Weight:      Height:       I changed her dressing.  There is minimal drainage.  The open area will need to be packed twice daily for now.  LABS:   Lab Results  Component Value Date   WBC 13.8 (H) 06/22/2022   HGB 8.8 (L) 06/22/2022   HCT 26.7 (L) 06/22/2022   MCV 89.3 06/22/2022   PLT 335 06/22/2022   Lab Results  Component Value Date   CREATININE 19.02 (H) 06/22/2022   Lab Results  Component Value Date   INR 1.2 06/22/2022   CBG (last 3)  Recent Labs    06/22/22 1059 06/22/22 1301 06/23/22 0723  GLUCAP 108* 116* 87    PROBLEM LIST:    Principal Problem:   Infection of AV graft for dialysis [facility])   CURRENT MEDS:    calcitRIOL  0.5 mcg Oral Daily   Chlorhexidine Gluconate Cloth  6 each Topical Q0600   cinacalcet  30 mg Oral Q supper   [START ON 06/29/2022] darbepoetin (ARANESP) injection - DIALYSIS  100 mcg Subcutaneous Q Tue-1800   heparin  5,000 Units Subcutaneous Q8H   insulin aspart  0-6 Units Subcutaneous TID WC   insulin glargine-yfgn  9 Units Subcutaneous Daily   sevelamer carbonate  1,600 mg Oral TID WC    Waverly Ferrari Office: [phone] 06/23/2022

## 2022-06-23 NOTE — Progress Notes (Signed)
Requested to see pt for out-pt in-center HD needs at d/c. Pt is a home HD pt prior to admission. Met with pt at bedside. Introduced self and explained role. Pt states she has received in-center HD at [facility] in the past. 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 Referral made to [facility] admissions today. Pt plans to drive self to/from HD and is willing to travel a little further if able to get a MWF 2nd shift appt. Will assist as needed.   Olivia Canter Renal Navigator [phone]

## 2022-06-23 NOTE — Progress Notes (Signed)
NAME:  Barbara Donovan, MRN:  [id number], DOB:  December 15, 1990, LOS: 0 ADMISSION DATE:  06/22/2022  Subjective  NAEON/Overnight events: Patient had dialysis. Finished around 5 am.   Patient evaluated at bedside this AM. Patient is tired just finished her dialysis. Feels sore in her arm at the incision site. Has a constant cough, feels like she has phlegm. Not a new cough. Reports she's hungry but her food was hard and needs a new tray. Was able to make urine last night.   Objective   Blood pressure (!) 143/79, pulse 100, temperature 99.7 F (37.6 C), temperature source Oral, resp. rate 14, height 5\' 6"  (1.676 m), weight 65.8 kg, SpO2 100 %.     Intake/Output Summary (Last 24 hours) at 06/23/2022 0736 Last data filed at 06/22/2022 2041 Gross per 24 hour  Intake 1340 ml  Output 25 ml  Net 1315 ml   Filed Weights   06/22/22 0735 06/22/22 0945  Weight: 65.8 kg 65.8 kg   GENERAL: sleepy in no acute distress HENT: Normocephalic, atraumatic.  EYES: Vision grossly intact. No conjunctival injection or scleral icterus.  CV: Regular rate, rhythm. Faint systolic murmur appreciated. PULM: Normal work of breathing on room air. Clear to auscultation bilaterally.  GI: Abdomen soft, non-tender, non-distended. Normoactive bowel sounds. MSK: Normal bulk, tone. No peripheral edema noted.  SKIN: Warm and dry. Left upper arm with drain in place. No erythema or edema present in LUE.   NEURO: Somnolent, reactive to verbal stimuli.   Labs       Latest Ref Rng & Units 06/22/2022    8:25 AM 12/05/2021    3:46 PM 08/11/2021    8:13 AM  CBC  WBC 4.0 - 10.5 K/uL 13.8  6.1    Hemoglobin 12.0 - 15.0 g/dL 8.8  46.9  62.9   Hematocrit 36.0 - 46.0 % 26.7  37.4  31.0   Platelets 150 - 400 K/uL 335  315        Latest Ref Rng & Units 06/22/2022    8:25 AM 12/05/2021    7:56 PM 08/11/2021    8:13 AM  BMP  Glucose 70 - 99 mg/dL 528  413  244   BUN 6 - 20 mg/dL 010  38  34   Creatinine 0.44 - 1.00 mg/dL 27.25  3.66   4.40   Sodium 135 - 145 mmol/L 127  141  137   Potassium 3.5 - 5.1 mmol/L 5.7  4.0  4.2   Chloride 98 - 111 mmol/L 85  96  105   CO2 22 - 32 mmol/L 16  30    Calcium 8.9 - 10.3 mg/dL 9.3  9.1      Summary  Barbara Donovan is a 32 yo female with past medical history significant for ESRD on home dialysis 4x a week, secondary hyperparathyroidism, HTN, and insulin-dependent DM, who presented to the ED with a LUE AV graft infection s/p left arm AV graft excision.   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 Vascular surgery performed extraction and found entire graft was infected down to the anastomosis. Graft cultures showed few GPC and moderate WBC present, predominately PMN. Patient temperature is 98.9 F. Antibiotics narrowed to Ancef from vancomycin and cefepime. Will need to be 48 hours line free before another catheter is placed, according to ID.  -Cefazolin 1g IV q24h -Dialudid 0.5  mg IV q4h prn   #ESRD #Anion Gap Metabolic Acidosis #Uremia  Patient received HD. K is 4.1, Na is 131, Bicarb is 20, BUN is 58, Cr is 9.84. Anion gap is 20. Nephrology following. Possible HD session again today.   #Anemia of ESRD Hb down to 8.3 from 8.8. Receiving Aranesp with HD. No iron supplement due to MSSA bacteremia. Nephrology following.   #Secondary hyperparathyroidism Resume calcitriol, Sensipar and binders   #Diabetes Well controlled, A1c 6.4 in 02/2022. Blood glucose is 132. Decrease home insulin to 9 units due to decreased food intake.  -repeat a1c -Glargine 9 units qAM  -SSI    #HTN  Elevated. SBP in 180-120s, DBP 90-60s. Seem to have stabilized to 140/80 after surgery. Will hold home medication until anesthesia subsides. Consider restarting one anti-hypertensive if BP rises overnight.  - HOLD Carvedilol 25 mg tablet BID  - HOLD Hydralazine 50 mg TID    #Right upper lobe nodule   New nodular opacity in the right upper lobe, which may be infectious, inflammatory or neoplastic.  - follow-up in 4-6 weeks to repeat CXR   Best practice:  DIET: thin with 1200 mL fluid restriction DVT PPX: heparin  CODE: Full FAM COM: Boyfriend, Kateri Mc Medical Student 06/23/2022, 7:36 AM  Pager: [phone]  If after hours (below), please contact on-call pager: [phone] 5PM-7AM Monday-Friday 1PM-7AM Saturday-Sunday

## 2022-06-24 ENCOUNTER — Encounter (HOSPITAL_COMMUNITY)
Admission: EM | Disposition: A | Discharge status: Home Health | Payer: Self-pay | Source: Home / Self Care | Attending: Internal Medicine

## 2022-06-24 DIAGNOSIS — R911 Solitary pulmonary nodule: Secondary | ICD-10-CM

## 2022-06-24 DIAGNOSIS — T827XXA Infection and inflammatory reaction due to other cardiac and vascular devices, implants and grafts, initial encounter: Secondary | ICD-10-CM | POA: Diagnosis not present

## 2022-06-24 DIAGNOSIS — I120 Hypertensive chronic kidney disease with stage 5 chronic kidney disease or end stage renal disease: Secondary | ICD-10-CM | POA: Diagnosis not present

## 2022-06-24 DIAGNOSIS — E1122 Type 2 diabetes mellitus with diabetic chronic kidney disease: Secondary | ICD-10-CM | POA: Diagnosis not present

## 2022-06-24 DIAGNOSIS — A4101 Sepsis due to Methicillin susceptible Staphylococcus aureus: Secondary | ICD-10-CM | POA: Diagnosis not present

## 2022-06-24 LAB — CBC WITH DIFFERENTIAL/PLATELET
Abs Immature Granulocytes: 0.21 10*3/uL — ABNORMAL HIGH (ref 0.00–0.07)
Basophils Absolute: 0 10*3/uL (ref 0.0–0.1)
Basophils Relative: 0 %
Eosinophils Absolute: 0.2 10*3/uL (ref 0.0–0.5)
Eosinophils Relative: 2 %
HCT: 25.7 % — ABNORMAL LOW (ref 36.0–46.0)
Hemoglobin: 8.3 g/dL — ABNORMAL LOW (ref 12.0–15.0)
Immature Granulocytes: 2 %
Lymphocytes Relative: 17 %
Lymphs Abs: 1.6 10*3/uL (ref 0.7–4.0)
MCH: 29.3 pg (ref 26.0–34.0)
MCHC: 32.3 g/dL (ref 30.0–36.0)
MCV: 90.8 fL (ref 80.0–100.0)
Monocytes Absolute: 1.3 10*3/uL — ABNORMAL HIGH (ref 0.1–1.0)
Monocytes Relative: 14 %
Neutro Abs: 6 10*3/uL (ref 1.7–7.7)
Neutrophils Relative %: 65 %
Platelets: 423 10*3/uL — ABNORMAL HIGH (ref 150–400)
RBC: 2.83 MIL/uL — ABNORMAL LOW (ref 3.87–5.11)
RDW: 18 % — ABNORMAL HIGH (ref 11.5–15.5)
WBC: 9.3 10*3/uL (ref 4.0–10.5)
nRBC: 0 % (ref 0.0–0.2)

## 2022-06-24 LAB — CULTURE, BLOOD (ROUTINE X 2)
Special Requests: ADEQUATE
Special Requests: ADEQUATE

## 2022-06-24 LAB — RENAL FUNCTION PANEL
Albumin: 2.5 g/dL — ABNORMAL LOW (ref 3.5–5.0)
Anion gap: 17 — ABNORMAL HIGH (ref 5–15)
BUN: 72 mg/dL — ABNORMAL HIGH (ref 6–20)
CO2: 23 mmol/L (ref 22–32)
Calcium: 8.7 mg/dL — ABNORMAL LOW (ref 8.9–10.3)
Chloride: 90 mmol/L — ABNORMAL LOW (ref 98–111)
Creatinine, Ser: 12.31 mg/dL — ABNORMAL HIGH (ref 0.44–1.00)
GFR, Estimated: 4 mL/min — ABNORMAL LOW (ref >60)
Glucose, Bld: 146 mg/dL — ABNORMAL HIGH (ref 70–99)
Phosphorus: 5.2 mg/dL — ABNORMAL HIGH (ref 2.5–4.6)
Potassium: 4.5 mmol/L (ref 3.5–5.1)
Sodium: 130 mmol/L — ABNORMAL LOW (ref 135–145)

## 2022-06-24 LAB — MISC LABCORP TEST (SEND OUT): Labcorp test code: 83935

## 2022-06-24 LAB — GLUCOSE, CAPILLARY
Glucose-Capillary: 127 mg/dL — ABNORMAL HIGH (ref 70–99)
Glucose-Capillary: 158 mg/dL — ABNORMAL HIGH (ref 70–99)
Glucose-Capillary: 253 mg/dL — ABNORMAL HIGH (ref 70–99)
Glucose-Capillary: 75 mg/dL (ref 70–99)

## 2022-06-24 SURGERY — INSERTION OF DIALYSIS CATHETER
Anesthesia: Choice

## 2022-06-24 MED ORDER — ANTICOAGULANT SODIUM CITRATE 4% (200MG/5ML) IV SOLN: 5.0000 mL | Status: DC | PRN

## 2022-06-24 MED ORDER — HEPARIN SODIUM (PORCINE) 1000 UNIT/ML DIALYSIS
1000.0000 [IU] | INTRAMUSCULAR | Status: DC | PRN
  Administered 2022-06-24: 1000 [IU]
  Filled 2022-06-24 (×2): qty 1

## 2022-06-24 MED ORDER — MIRTAZAPINE 15 MG PO TABS
15.0000 mg | ORAL_TABLET | Freq: Every day | ORAL | Status: DC
  Administered 2022-06-24 – 2022-06-27 (×4): 15 mg via ORAL
  Filled 2022-06-24 (×5): qty 1

## 2022-06-24 MED ORDER — ACETAMINOPHEN 325 MG PO TABS
650.0000 mg | ORAL_TABLET | Freq: Four times a day (QID) | ORAL | Status: DC | PRN
  Administered 2022-06-24: 650 mg via ORAL
  Filled 2022-06-24: qty 2

## 2022-06-24 MED ORDER — ALTEPLASE 2 MG IJ SOLR: 2.0000 mg | Freq: Once | INTRAMUSCULAR | Status: DC | PRN

## 2022-06-24 MED ORDER — POLYETHYLENE GLYCOL 3350 17 G PO PACK
17.0000 g | PACK | Freq: Every day | ORAL | Status: DC
  Administered 2022-06-24 – 2022-06-29 (×4): 17 g via ORAL
  Filled 2022-06-24 (×5): qty 1

## 2022-06-24 NOTE — Progress Notes (Addendum)
[facility]  Date of Admission:  06/22/2022     Total days of antibiotics 3         ASSESSMENT:  Barbara Donovan blood cultures from 06/23/22 are without growth to date. Plan for temporary catheter removal following dialysis today for line holiday of 1-3 days with timing of placement of new catheter per Nephrology. Discussed plan of care to continue with antibiotics with dialysis and need to monitor blood cultures for clearance of bacteremia. Continue current dose of Cefazolin. Remaining medical and supportive care per Internal Medicine Team.   PLAN:  Continue current dose of Cefazolin with dialysis.  Line holiday per Nephrology. Monitor cultures for clearance of bacteremia.  Remaining medical and supportive care per Internal Medicine Team.   I have personally spent 28 minutes involved in face-to-face and non-face-to-face activities for this patient on the day of the visit. Professional time spent includes the following activities: Preparing to see the patient (review of tests), Obtaining and/or reviewing separately obtained history (admission/discharge record), Performing a medically appropriate examination and/or evaluation , Ordering medications/tests/procedures, referring and communicating with other health care professionals, Documenting clinical information in the EMR, Independently interpreting results (not separately reported), Communicating results to the patient/family/caregiver, Counseling and educating the patient/family/caregiver and Care coordination (not separately reported).   Principal Problem:   Infection of AV graft for dialysis [facility]) Active Problems:   MSSA bacteremia    amLODipine  10 mg Oral QHS   calcitRIOL  0.5 mcg Oral Daily   carvedilol  25 mg Oral BID WC   Chlorhexidine Gluconate Cloth  6 each Topical Q0600   cinacalcet  30 mg Oral Q supper   [START ON 06/29/2022] darbepoetin (ARANESP) injection - DIALYSIS  100 mcg Subcutaneous Q  Tue-1800   heparin  5,000 Units Subcutaneous Q8H   insulin aspart  0-6 Units Subcutaneous TID WC   insulin glargine-yfgn  9 Units Subcutaneous Daily   mirtazapine  15 mg Oral QHS   multivitamin  1 tablet Oral QHS   polyethylene glycol  17 g Oral Daily   sucroferric oxyhydroxide  1,500 mg Oral TID WC    SUBJECTIVE:  Afebrile overnight with no acute events. Seen while in dialysis. Has questions about course of treatment and infection.   No Known Allergies   Review of Systems: Review of Systems  Constitutional:  Negative for chills, fever and weight loss.  Respiratory:  Negative for cough, shortness of breath and wheezing.   Cardiovascular:  Negative for chest pain and leg swelling.  Gastrointestinal:  Negative for abdominal pain, constipation, diarrhea, nausea and vomiting.  Skin:  Negative for rash.      OBJECTIVE: Vitals:   06/24/22 1300 06/24/22 1330 06/24/22 1344 06/24/22 1354  BP: (!) 133/92 123/78 114/84 126/78  Pulse: 88 95 98 93  Resp: 17 19 18 18   Temp:    99.7 F (37.6 C)  TempSrc:    Oral  SpO2: 99% 100% 99% 99%  Weight:    60.2 kg  Height:       Body mass index is 21.42 kg/m.  Physical Exam Constitutional:      General: She is not in acute distress.    Appearance: She is well-developed.     Comments: Laying in bed with head of bed partially elevated; pleasant.   Cardiovascular:     Rate and Rhythm: Normal rate and regular rhythm.     Heart sounds: Normal heart sounds.     Comments: Dialysis catheter  in left upper chest - infusing.  Pulmonary:     Effort: Pulmonary effort is normal.     Breath sounds: Normal breath sounds.  Skin:    General: Skin is warm and dry.  Neurological:     Mental Status: She is alert and oriented to person, place, and time.     Lab Results Lab Results  Component Value Date   WBC 9.3 06/24/2022   HGB 8.3 (L) 06/24/2022   HCT 25.7 (L) 06/24/2022   MCV 90.8 06/24/2022   PLT 423 (H) 06/24/2022    Lab Results   Component Value Date   CREATININE 12.31 (H) 06/24/2022   BUN 72 (H) 06/24/2022   NA 130 (L) 06/24/2022   K 4.5 06/24/2022   CL 90 (L) 06/24/2022   CO2 23 06/24/2022    Lab Results  Component Value Date   ALT 33 06/22/2022   AST 20 06/22/2022   ALKPHOS 99 06/22/2022   BILITOT 0.8 06/22/2022     Microbiology: Recent Results (from the past 240 hour(s))  Culture, blood (Routine x 2)     Status: Abnormal   Collection Time: 06/22/22  7:37 AM   Specimen: BLOOD RIGHT ARM  Result Value Ref Range Status   Specimen Description BLOOD RIGHT ARM  Final   Special Requests   Final    BOTTLES DRAWN AEROBIC AND ANAEROBIC Blood Culture adequate volume   Culture  Setup Time   Final    GRAM POSITIVE COCCI IN CLUSTERS IN BOTH AEROBIC AND ANAEROBIC BOTTLES CRITICAL RESULT CALLED TO, READ BACK BY AND VERIFIED WITH: PHARMD JAMES LEDFORD ON 06/22/22 @ 2239 BY DRT Performed at [facility], [address], [place], [place] [address]    Culture STAPHYLOCOCCUS AUREUS (A)  Final   Report Status 06/24/2022 FINAL  Final   Organism ID, Bacteria STAPHYLOCOCCUS AUREUS  Final      Susceptibility   Staphylococcus aureus - MIC*    CIPROFLOXACIN <=0.5 SENSITIVE Sensitive     ERYTHROMYCIN >=8 RESISTANT Resistant     GENTAMICIN <=0.5 SENSITIVE Sensitive     OXACILLIN <=0.25 SENSITIVE Sensitive     TETRACYCLINE >=16 RESISTANT Resistant     VANCOMYCIN 1 SENSITIVE Sensitive     TRIMETH/SULFA <=10 SENSITIVE Sensitive     CLINDAMYCIN <=0.25 SENSITIVE Sensitive     RIFAMPIN <=0.5 SENSITIVE Sensitive     Inducible Clindamycin NEGATIVE Sensitive     LINEZOLID 2 SENSITIVE Sensitive     * STAPHYLOCOCCUS AUREUS  Blood Culture ID Panel (Reflexed)     Status: Abnormal   Collection Time: 06/22/22  7:37 AM  Result Value Ref Range Status   Enterococcus faecalis NOT DETECTED NOT DETECTED Final   Enterococcus Faecium NOT DETECTED NOT DETECTED Final   Listeria monocytogenes NOT DETECTED NOT DETECTED Final    Staphylococcus species DETECTED (A) NOT DETECTED Final    Comment: CRITICAL RESULT CALLED TO, READ BACK BY AND VERIFIED WITH: PHARMD JAMES LEDFORD ON 06/22/22 @ 2239 BY DRT    Staphylococcus aureus (BCID) DETECTED (A) NOT DETECTED Final    Comment: CRITICAL RESULT CALLED TO, READ BACK BY AND VERIFIED WITH: PHARMD JAMES LEDFORD ON 06/22/22 @ 2239 BY DRT    Staphylococcus epidermidis NOT DETECTED NOT DETECTED Final   Staphylococcus lugdunensis NOT DETECTED NOT DETECTED Final   Streptococcus species NOT DETECTED NOT DETECTED Final   Streptococcus agalactiae NOT DETECTED NOT DETECTED Final   Streptococcus pneumoniae NOT DETECTED NOT DETECTED Final   Streptococcus pyogenes NOT DETECTED NOT  DETECTED Final   A.calcoaceticus-baumannii NOT DETECTED NOT DETECTED Final   Bacteroides fragilis NOT DETECTED NOT DETECTED Final   Enterobacterales NOT DETECTED NOT DETECTED Final   Enterobacter cloacae complex NOT DETECTED NOT DETECTED Final   Escherichia coli NOT DETECTED NOT DETECTED Final   Klebsiella aerogenes NOT DETECTED NOT DETECTED Final   Klebsiella oxytoca NOT DETECTED NOT DETECTED Final   Klebsiella pneumoniae NOT DETECTED NOT DETECTED Final   Proteus species NOT DETECTED NOT DETECTED Final   Salmonella species NOT DETECTED NOT DETECTED Final   Serratia marcescens NOT DETECTED NOT DETECTED Final   Haemophilus influenzae NOT DETECTED NOT DETECTED Final   Neisseria meningitidis NOT DETECTED NOT DETECTED Final   Pseudomonas aeruginosa NOT DETECTED NOT DETECTED Final   Stenotrophomonas maltophilia NOT DETECTED NOT DETECTED Final   Candida albicans NOT DETECTED NOT DETECTED Final   Candida auris NOT DETECTED NOT DETECTED Final   Candida glabrata NOT DETECTED NOT DETECTED Final   Candida krusei NOT DETECTED NOT DETECTED Final   Candida parapsilosis NOT DETECTED NOT DETECTED Final   Candida tropicalis NOT DETECTED NOT DETECTED Final   Cryptococcus neoformans/gattii NOT DETECTED NOT DETECTED Final    Meth resistant mecA/C and MREJ NOT DETECTED NOT DETECTED Final    Comment: Performed at [facility], [address], [place], [place] [address]  Aerobic/Anaerobic Culture w Gram Stain (surgical/deep wound)     Status: None (Preliminary result)   Collection Time: 06/22/22 10:52 AM   Specimen: Wound; Body Fluid  Result Value Ref Range Status   Specimen Description FLUID  Final   Special Requests LEFT ARM PERI GRAFT  Final   Gram Stain   Final    MODERATE WBC PRESENT, PREDOMINANTLY PMN RARE SQUAMOUS EPITHELIAL CELLS PRESENT FEW GRAM POSITIVE COCCI Performed at [facility], [address], [place], [place] [address]    Culture   Final    FEW STAPHYLOCOCCUS AUREUS NO ANAEROBES ISOLATED; CULTURE IN PROGRESS FOR 5 DAYS    Report Status PENDING  Incomplete   Organism ID, Bacteria STAPHYLOCOCCUS AUREUS  Final      Susceptibility   Staphylococcus aureus - MIC*    CIPROFLOXACIN <=0.5 SENSITIVE Sensitive     ERYTHROMYCIN >=8 RESISTANT Resistant     GENTAMICIN <=0.5 SENSITIVE Sensitive     OXACILLIN <=0.25 SENSITIVE Sensitive     TETRACYCLINE >=16 RESISTANT Resistant     VANCOMYCIN <=0.5 SENSITIVE Sensitive     TRIMETH/SULFA <=10 SENSITIVE Sensitive     CLINDAMYCIN <=0.25 SENSITIVE Sensitive     RIFAMPIN <=0.5 SENSITIVE Sensitive     Inducible Clindamycin NEGATIVE Sensitive     LINEZOLID 2 SENSITIVE Sensitive     * FEW STAPHYLOCOCCUS AUREUS  Culture, blood (Routine X 2) w Reflex to ID Panel     Status: None (Preliminary result)   Collection Time: 06/23/22  8:03 AM   Specimen: BLOOD  Result Value Ref Range Status   Specimen Description BLOOD RIGHT ANTECUBITAL  Final   Special Requests   Final    BOTTLES DRAWN AEROBIC AND ANAEROBIC Blood Culture results may not be optimal due to an inadequate volume of blood received in culture bottles   Culture   Final    NO GROWTH < 24 HOURS Performed at [facility], [address], [place], [place] [address]    Report Status  PENDING  Incomplete  Culture, blood (Routine X 2) w Reflex to ID Panel     Status: None (Preliminary result)  Collection Time: 06/23/22  8:05 AM   Specimen: BLOOD RIGHT FOREARM  Result Value Ref Range Status   Specimen Description BLOOD RIGHT FOREARM  Final   Special Requests   Final    BOTTLES DRAWN AEROBIC AND ANAEROBIC Blood Culture adequate volume   Culture   Final    NO GROWTH < 24 HOURS Performed at [facility], [address] [place], [place] [address]    Report Status PENDING  Incomplete     Marcos Eke, NP [facility]  06/24/2022  2:35 PM

## 2022-06-24 NOTE — Progress Notes (Addendum)
[facility] Progress Note   Subjective: Seen on HD. Will remove Temp catheter post HD today. Discussed OP placement-[facility]. Discussed with Dr. Ronalee Belts who will accept pt back to clinic if chair is available. UF as tolerated today.    Objective Vitals:   06/23/22 1000 06/23/22 1711 06/23/22 2055 06/24/22 0658  BP: (!) 140/78 (!) 165/97 133/77 110/62  Pulse: 91 100 80 72  Resp: 19 18 18 16   Temp: 98.9 F (37.2 C) 98.8 F (37.1 C) 98.5 F (36.9 C) 98.4 F (36.9 C)  TempSrc: Oral Oral Oral Oral  SpO2: 100% 100% 99% 98%  Weight:      Height:       Physical Exam General: Chronically ill appearing female in NAD Heart: S1,S2 RRR No M/R/G Lungs: CTAB A/P. No WOB.  Abdomen: NABS, NT Extremities: No LE edema Dialysis Access: LIJ Temp catheter drsg intact    Additional Objective Labs: Basic Metabolic Panel: Recent Labs  Lab 06/22/22 0825 06/23/22 0803 06/24/22 0353  NA 127* 131* 130*  K 5.7* 4.1 4.5  CL 85* 91* 90*  CO2 16* 20* 23  GLUCOSE 132* 87 146*  BUN 135* 58* 72*  CREATININE 19.02* 9.84* 12.31*  CALCIUM 9.3 8.9 8.7*  PHOS  --  4.3 5.2*   Liver Function Tests: Recent Labs  Lab 06/22/22 0825 06/23/22 0803 06/24/22 0353  AST 20  --   --   ALT 33  --   --   ALKPHOS 99  --   --   BILITOT 0.8  --   --   PROT 7.2  --   --   ALBUMIN 3.2* 2.8* 2.5*   No results for input(s): "LIPASE", "AMYLASE" in the last 168 hours. CBC: Recent Labs  Lab 06/22/22 0825 06/23/22 0803 06/24/22 0353  WBC 13.8* 9.1 9.3  NEUTROABS 10.7*  --  6.0  HGB 8.8* 8.3* 8.3*  HCT 26.7* 24.9* 25.7*  MCV 89.3 89.2 90.8  PLT 335 360 423*   Blood Culture    Component Value Date/Time   SDES BLOOD RIGHT FOREARM 06/23/2022 0805   SPECREQUEST  06/23/2022 0805    BOTTLES DRAWN AEROBIC AND ANAEROBIC Blood Culture adequate volume   CULT  06/23/2022 0805    NO GROWTH < 24 HOURS Performed at [facility], [address], [place], [place] [address]     REPTSTATUS PENDING 06/23/2022 6045    Cardiac Enzymes: No results for input(s): "CKTOTAL", "CKMB", "CKMBINDEX", "TROPONINI" in the last 168 hours. CBG: Recent Labs  Lab 06/23/22 0723 06/23/22 1214 06/23/22 1625 06/23/22 2055 06/24/22 0755  GLUCAP 87 100* 129* 169* 127*   Iron Studies: No results for input(s): "IRON", "TIBC", "TRANSFERRIN", "FERRITIN" in the last 72 hours. @lablastinr3 @ Studies/Results: ECHOCARDIOGRAM COMPLETE  Result Date: 06/23/2022    ECHOCARDIOGRAM REPORT   Patient Name:   Barbara Donovan Date of Exam: 06/23/2022 Medical Rec #:  [id number]        Height:       66.0 in Accession #:    [id number]       Weight:       145.0 lb Date of Birth:  Feb 17, 1990        BSA:          1.744 m Patient Age:    31 years         BP:           143/79 mmHg Patient Gender: F  HR:           86 bpm. Exam Location:  Inpatient Procedure: 2D Echo, Cardiac Doppler and Color Doppler Indications:    Bacteremia  History:        Patient has prior history of Echocardiogram examinations, most                 recent 07/05/2021. Risk Factors:Diabetes and Hypertension.  Sonographer:    Darlys Gales Referring Phys: Lisette Grinder [facility] [place] IMPRESSIONS  1. Left ventricular ejection fraction, by estimation, is 55 to 60%. The left ventricle has normal function. The left ventricle has no regional wall motion abnormalities. There is mild concentric left ventricular hypertrophy. Left ventricular diastolic parameters were normal.  2. Right ventricular systolic function is normal. The right ventricular size is normal.  3. The mitral valve is normal in structure. Trivial mitral valve regurgitation. No evidence of mitral stenosis.  4. The aortic valve is normal in structure. Aortic valve regurgitation is not visualized. No aortic stenosis is present.  5. The inferior vena cava is normal in size with greater than 50% respiratory variability, suggesting right atrial pressure of 3 mmHg. Conclusion(s)/Recommendation(s): No  evidence of valvular vegetations on this transthoracic echocardiogram. Consider a transesophageal echocardiogram to exclude infective endocarditis if clinically indicated. FINDINGS  Left Ventricle: Left ventricular ejection fraction, by estimation, is 55 to 60%. The left ventricle has normal function. The left ventricle has no regional wall motion abnormalities. The left ventricular internal cavity size was normal in size. There is  mild concentric left ventricular hypertrophy. Left ventricular diastolic parameters were normal. Normal left ventricular filling pressure. Right Ventricle: The right ventricular size is normal. No increase in right ventricular wall thickness. Right ventricular systolic function is normal. Left Atrium: Left atrial size was normal in size. Right Atrium: Right atrial size was normal in size. Pericardium: Trivial pericardial effusion is present. The pericardial effusion is posterior to the left ventricle. Mitral Valve: The mitral valve is normal in structure. Trivial mitral valve regurgitation. No evidence of mitral valve stenosis. Tricuspid Valve: The tricuspid valve is normal in structure. Tricuspid valve regurgitation is trivial. No evidence of tricuspid stenosis. Aortic Valve: The aortic valve is normal in structure. Aortic valve regurgitation is not visualized. No aortic stenosis is present. Aortic valve mean gradient measures 6.0 mmHg. Aortic valve peak gradient measures 11.3 mmHg. Pulmonic Valve: The pulmonic valve was normal in structure. Pulmonic valve regurgitation is trivial. No evidence of pulmonic stenosis. Aorta: The aortic root is normal in size and structure. Venous: The inferior vena cava is normal in size with greater than 50% respiratory variability, suggesting right atrial pressure of 3 mmHg. IAS/Shunts: No atrial level shunt detected by color flow Doppler.  LEFT VENTRICLE PLAX 2D LVIDd:         4.90 cm Diastology LVIDs:         3.50 cm LV e' medial:    8.81 cm/s LV PW:          1.30 cm LV E/e' medial:  9.0 LV IVS:        1.20 cm LV e' lateral:   11.50 cm/s                        LV E/e' lateral: 6.9  RIGHT VENTRICLE RV S prime:     14.10 cm/s TAPSE (M-mode): 2.4 cm LEFT ATRIUM             Index  RIGHT ATRIUM           Index LA Vol (A2C):   44.8 ml 25.68 ml/m  RA Area:     14.20 cm LA Vol (A4C):   42.9 ml 24.59 ml/m  RA Volume:   30.90 ml  17.71 ml/m LA Biplane Vol: 45.0 ml 25.80 ml/m  AORTIC VALVE AV Vmax:           168.00 cm/s AV Vmean:          113.000 cm/s AV VTI:            0.285 m AV Peak Grad:      11.3 mmHg AV Mean Grad:      6.0 mmHg LVOT Vmax:         154.00 cm/s LVOT Vmean:        108.000 cm/s LVOT VTI:          0.282 m LVOT/AV VTI ratio: 0.99  AORTA Ao Root diam: 2.50 cm MITRAL VALVE MV Area (PHT): 4.08 cm    SHUNTS MV Decel Time: 186 msec    Systemic VTI: 0.28 m MV E velocity: 79.70 cm/s MV A velocity: 59.20 cm/s MV E/A ratio:  1.35 Armanda Magic MD Electronically signed by Armanda Magic MD Signature Date/Time: 06/23/2022/10:51:38 AM    Final    DG Chest Port 1 View  Result Date: 06/22/2022 CLINICAL DATA:  Status post insertion of dialysis catheter. EXAM: PORTABLE CHEST 1 VIEW COMPARISON:  Chest radiograph earlier today FINDINGS: Dual lumen left internal jugular dialysis catheter tip is in the right atrium. No pneumothorax. Overall low lung volumes. Stable cardiomegaly. Mediastinal contours are unchanged. The faint right upper lobe opacity is not well delineated on the current exam, with no definite nodular component. There may be small bilateral pleural effusions. IMPRESSION: 1. Tip of the dual lumen left internal jugular dialysis catheter is in the right atrium. No pneumothorax. 2. Stable cardiomegaly. 3. The previous right upper lobe nodular density is faintly visualized on the current exam. Electronically Signed   By: Narda Rutherford M.D.   On: 06/22/2022 16:50   DG C-Arm 1-60 Min-No Report  Result Date: 06/22/2022 Fluoroscopy was utilized by the  requesting physician.  No radiographic interpretation.   Medications:  sodium chloride Stopped (06/24/22 0023)   anticoagulant sodium citrate      ceFAZolin (ANCEF) IV Stopped (06/24/22 0022)    amLODipine  10 mg Oral QHS   calcitRIOL  0.5 mcg Oral Daily   carvedilol  25 mg Oral BID WC   Chlorhexidine Gluconate Cloth  6 each Topical Q0600   cinacalcet  30 mg Oral Q supper   [START ON 06/29/2022] darbepoetin (ARANESP) injection - DIALYSIS  100 mcg Subcutaneous Q Tue-1800   heparin  5,000 Units Subcutaneous Q8H   insulin aspart  0-6 Units Subcutaneous TID WC   insulin glargine-yfgn  9 Units Subcutaneous Daily   mirtazapine  15 mg Oral QHS   multivitamin  1 tablet Oral QHS   polyethylene glycol  17 g Oral Daily   sucroferric oxyhydroxide  1,500 mg Oral TID WC     Assessment/Plan: 1OP HD orders: HHD  MTTF Dialyzes at [facility] HD,  3 hrs, 4x/week, EDW 63.4Kg. HD Bath 2k, BFR 400. Access: LUE AVG removed on 6/4. Mircera 150 mcg on 5/14 Calcitriol 0.5 mcg daily Sensipar 30 mg daily Velphoro 3 tabs w/meal     Assessment/Plan:   # I. 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 Will  remove Temp cath today post HD today. Needs at least 2 days before [facility] replaced. Continue Ancef   # ESRD on HHD 4x/week: Last dialysis about 5 days ago and labs abnormal. HD 06/22/2022 finished around 5 AM today. Will use T,Th,S schedule while in hospital. Next HD 06/24/2022. She will need to return to in-center HD while using [facility]. [facility] prefers [facility] if possible. Renal navigator notified for CLIP.    # Hypertension/volume overload: Resume home medication. HD last PM.  Net UF 3 liters. Still above OP EDW. Continue to lower EDW as tolerated.    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 Aranesp 100 mcg SQ. Follow HGB. No iron D/T infection.    # Secondary hyperparathyroidism/hyperphosphatemia: Resume calcitriol, Sensipar and binders.   # Hypervolemic hyponatremia, hyperkalemia, acidosis all due to incomplete/missing  dialysis.  May need another session of dialysis tomorrow.  Monitor lab.  Irelynn Schermerhorn H. Alick Lecomte NP-C 06/24/2022, 9:05 AM  [employer] [phone]

## 2022-06-24 NOTE — Progress Notes (Signed)
HD#1 Subjective:   Summary: Barbara Donovan is a 32 y.o. female with pertinent PMH of ESRD on HD at home, T1DM, and HTN who presented with infected AV graft and is admitted for MSSA bacteremia 2/2 infected AV graft.   Patient is having some pain around her left IJ temporary dialysis catheter but this has been there since it was placed.  She denies any other new or worsening symptoms.  She is understanding of the plan to get dialysis today and then remove her temporary catheter while we continue to treat her infection.  Objective:  Vital signs in last 24 hours: Vitals:   06/23/22 0608 06/23/22 1000 06/23/22 1711 06/23/22 2055  BP: (!) 143/79 (!) 140/78 (!) 165/97 133/77  Pulse: 100 91 100 80  Resp: 14 19 18 18   Temp: 99.7 F (37.6 C) 98.9 F (37.2 C) 98.8 F (37.1 C) 98.5 F (36.9 C)  TempSrc: Oral Oral Oral Oral  SpO2: 100% 100% 100% 99%  Weight:      Height:       Supplemental O2: Room Air SpO2: 99 % O2 Flow Rate (L/min): 2 L/min   Physical Exam:  Constitutional: Tired appearing adult female, in no acute distress HENT: normocephalic atraumatic, mucous membranes moist Eyes: conjunctiva non-erythematous Neck: supple, left IJ temporary HD catheter in place without surrounding edema, erythema, or drainage Cardiovascular: regular rate and rhythm Pulmonary/Chest: normal work of breathing on room air MSK: normal bulk and tone Neurological: alert & oriented x 3, No focal deficits noted Skin: warm and dry  Filed Weights   06/22/22 0735 06/22/22 0945  Weight: 65.8 kg 65.8 kg      Intake/Output Summary (Last 24 hours) at 06/24/2022 0654 Last data filed at 06/24/2022 0600 Gross per 24 hour  Intake 419.84 ml  Output 0 ml  Net 419.84 ml   Net IO Since Admission: 1,734.84 mL [06/24/22 0654]  Pertinent Labs:    Latest Ref Rng & Units 06/24/2022    3:53 AM 06/23/2022    8:03 AM 06/22/2022    8:25 AM  CBC  WBC 4.0 - 10.5 K/uL 9.3  9.1  13.8   Hemoglobin 12.0 - 15.0 g/dL 8.3   8.3  8.8   Hematocrit 36.0 - 46.0 % 25.7  24.9  26.7   Platelets 150 - 400 K/uL 423  360  335        Latest Ref Rng & Units 06/24/2022    3:53 AM 06/23/2022    8:03 AM 06/22/2022    8:25 AM  CMP  Glucose 70 - 99 mg/dL 401  87  027   BUN 6 - 20 mg/dL 72  58  253   Creatinine 0.44 - 1.00 mg/dL 66.44  0.34  74.25   Sodium 135 - 145 mmol/L 130  131  127   Potassium 3.5 - 5.1 mmol/L 4.5  4.1  5.7   Chloride 98 - 111 mmol/L 90  91  85   CO2 22 - 32 mmol/L 23  20  16    Calcium 8.9 - 10.3 mg/dL 8.7  8.9  9.3   Total Protein 6.5 - 8.1 g/dL   7.2   Total Bilirubin 0.3 - 1.2 mg/dL   0.8   Alkaline Phos 38 - 126 U/L   99   AST 15 - 41 U/L   20   ALT 0 - 44 U/L   33     Assessment/Plan:   Principal Problem:   Infection of AV  graft for dialysis [facility])   Patient Summary: Barbara Donovan is a 32 y.o. female with pertinent PMH of ESRD on HD at home, T1DM, and HTN who presented with infected AV graft and is admitted for MSSA bacteremia 2/2 infected AV graft, on hospital day 1.   MSSA bacteremia 2/2 infected LUE AV graft S/p AV graft extraction on 06/22/2022.  Afebrile overnight after narrowing antibiotics to cefazolin.  Repeat blood cultures taken yesterday are negative at 12 hours.  Will undergo HD today and then we will remove her temporary HD catheter for line holiday. - Continue cefazolin, monitor blood cultures - Appreciate vascular, nephrology, and ID  ESRD on HD at home Uremia with AGMA and hyperkalemia Continues to have an anion gap of 17 with a BUN of 72, potassium stable 4.5.  Undergoing dialysis this morning.  Plan as above to remove HD catheter for line holiday.  We will work with nephrology, vascular, and infectious disease on when and how she will need her next dialysis session.  T1DM A1c 5.8 here down from 6.4.  Blood sugars are stable on insulin glargine 9 units daily and sliding scale insulin.  Hypertension Holding home carvedilol 25 mg twice daily and hydralazine 50 mg 3  times daily but will add back as tolerated.  Right upper lobe lung nodule Seen on chest x-ray.  Will need follow-up outpatient.  Diet: Renal VTE: Heparin Code: Full  Dispo: Anticipated discharge to Home once her infection is cleared and we are able to place long-term dialysis access.  Rocky Morel, DO Internal Medicine Resident PGY-1 Pager: [phone]  Please contact the on call pager after 5 pm and on weekends at [phone].

## 2022-06-24 NOTE — Progress Notes (Signed)
   06/24/22 2107  Provider Notification  Provider Name/Title McLendon,MD  Date Provider Notified 06/24/22  Time Provider Notified 2110  Method of Notification Page  Notification Reason Other (Comment) (Temp 100.9)  Provider response No new orders  Date of Provider Response 06/24/22  Time of Provider Response 2113

## 2022-06-24 NOTE — Progress Notes (Signed)
POST HD TX NOTE  06/24/22 1354  Vitals  Temp 99.7 F (37.6 C)  Temp Source Oral  BP 126/78  MAP (mmHg) 90  BP Location Right Wrist  BP Method Automatic  Patient Position (if appropriate) Lying  Pulse Rate 93  Pulse Rate Source Monitor  ECG Heart Rate 96  Resp 18  Oxygen Therapy  SpO2 99 %  O2 Device Room Air  Pulse Oximetry Type Continuous  During Treatment Monitoring  Intra-Hemodialysis Comments (S)   (post HD tx VS check)  Post Treatment  Dialyzer Clearance Heavily streaked (w/ lots of clotts on top of dialyzer and clot at top of chamber)  Duration of HD Treatment -hour(s) 4 hour(s)  Hemodialysis Intake (mL) 0 mL  Liters Processed 96  Fluid Removed (mL) 3000 mL  Tolerated HD Treatment Yes  Post-Hemodialysis Comments (S)  tx completed w/o problem, UF goal met, blood rinsed back, VSS. Medication Admin: Heparin Dwells 2800 units  Hemodialysis Catheter Left Internal jugular Triple lumen Temporary (Non-Tunneled)  Placement Date/Time: 06/22/22 1234   Serial / Lot #: [id number]  Expiration Date: 06/19/22  Time Out: Correct patient;Correct site;Correct procedure  Maximum sterile barrier precautions: Hand hygiene;Cap;Mask;Sterile gown;Sterile gloves;Large sterile sh...  Site Condition No complications  Blue Lumen Status Heparin locked;Dead end cap in place  Red Lumen Status Heparin locked;Dead end cap in place  Purple Lumen Status Saline locked  Catheter fill solution Heparin 1000 units/ml  Catheter fill volume (Arterial) 1.4 cc  Catheter fill volume (Venous) 1.4  Dressing Type Transparent  Dressing Status Antimicrobial disc in place;Clean, Dry, Intact  Drainage Description None  Dressing Change Due 06/28/22  Post treatment catheter status Capped and Clamped

## 2022-06-24 NOTE — Progress Notes (Addendum)
  Progress Note  VASCULAR SURGERY ASSESSMENT & PLAN:   END-STAGE RENAL DISEASE: If the patient is going to be dialyzed today then the left IJ temporary catheter should be removed after dialysis.  If the patient does not need dialysis then the catheter can be removed. Dr. Daiva Eves would like the catheter out completely and negative blood cultures before placing a tunneled dialysis catheter.  POD 2 S/P REMOVAL OF INFECTED LEFT UPPER ARM AV GRAFT: The drain can be removed tomorrow.  Continue packing changes to the wound in the mid upper arm.  ID: The intraoperative culture had Staph aureus.  The patient is currently on IV Ancef.  Cari Caraway, MD 8:20 AM    06/24/2022 7:29 AM 2 Days Post-Op  Subjective:  no complaints  afebrile  Vitals:   06/23/22 2055 06/24/22 0658  BP: 133/77 110/62  Pulse: 80 72  Resp: 18 16  Temp: 98.5 F (36.9 C) 98.4 F (36.9 C)  SpO2: 99% 98%   Physical Exam: General:  sleepy but wakes to voice Lungs:  non labored on RA Incisions:    Extremities:  easily palpable left radial pulse   CBC    Component Value Date/Time   WBC 9.3 06/24/2022 0353   RBC 2.83 (L) 06/24/2022 0353   HGB 8.3 (L) 06/24/2022 0353   HGB 9.7 (L) 12/22/2017 1459   HGB 13.0 07/07/2017 0000   HCT 25.7 (L) 06/24/2022 0353   HCT 28.5 (L) 12/22/2017 1459   HCT 39 07/07/2017 0000   PLT 423 (H) 06/24/2022 0353   PLT 427 12/22/2017 1459   PLT 374 07/07/2017 0000   MCV 90.8 06/24/2022 0353   MCV 82 12/22/2017 1459   MCH 29.3 06/24/2022 0353   MCHC 32.3 06/24/2022 0353   RDW 18.0 (H) 06/24/2022 0353   RDW 13.0 12/22/2017 1459   LYMPHSABS 1.6 06/24/2022 0353   MONOABS 1.3 (H) 06/24/2022 0353   EOSABS 0.2 06/24/2022 0353   BASOSABS 0.0 06/24/2022 0353   Assessment/Plan:  32 y.o. female is s/p:  Removal of infected LUA AVG with vein patch angioplasty and placement of temporary dialysis catheter.  2 Days Post-Op  -dressing removed today and wound clean.   -penrose in  place-will remove this tomorrow. -continue wet to dry dressing changes to left arm. -ID recommending temp cath be removed for line holiday, therefore no TDC today.  Doreatha Massed, PA-C [facility] [phone] 06/24/2022 7:29 AM

## 2022-06-24 NOTE — Progress Notes (Signed)
   06/24/22 2315  Provider Notification  Provider Name/Title McLendon,MD  Date Provider Notified 06/24/22  Time Provider Notified 2317  Method of Notification Page  Notification Reason Other (Comment) (Temp 101)  Provider response En route;See new orders  Date of Provider Response 06/24/22  Time of Provider Response 2317

## 2022-06-24 NOTE — Procedures (Signed)
I was present at this dialysis session. I have reviewed the session itself and made appropriate changes.   Vital signs in last 24 hours:  Temp:  [98.4 F (36.9 C)-98.9 F (37.2 C)] 98.4 F (36.9 C) (06/06 0658) Pulse Rate:  [72-100] 72 (06/06 0658) Resp:  [16-19] 16 (06/06 0658) BP: (110-165)/(62-97) 110/62 (06/06 0658) SpO2:  [98 %-100 %] 98 % (06/06 0658) Weight change:  Filed Weights   06/22/22 0735 06/22/22 0945  Weight: 65.8 kg 65.8 kg    Recent Labs  Lab 06/24/22 0353  NA 130*  K 4.5  CL 90*  CO2 23  GLUCOSE 146*  BUN 72*  CREATININE 12.31*  CALCIUM 8.7*  PHOS 5.2*    Recent Labs  Lab 06/22/22 0825 06/23/22 0803 06/24/22 0353  WBC 13.8* 9.1 9.3  NEUTROABS 10.7*  --  6.0  HGB 8.8* 8.3* 8.3*  HCT 26.7* 24.9* 25.7*  MCV 89.3 89.2 90.8  PLT 335 360 423*    Scheduled Meds:  amLODipine  10 mg Oral QHS   calcitRIOL  0.5 mcg Oral Daily   carvedilol  25 mg Oral BID WC   Chlorhexidine Gluconate Cloth  6 each Topical Q0600   cinacalcet  30 mg Oral Q supper   [START ON 06/29/2022] darbepoetin (ARANESP) injection - DIALYSIS  100 mcg Subcutaneous Q Tue-1800   heparin  5,000 Units Subcutaneous Q8H   insulin aspart  0-6 Units Subcutaneous TID WC   insulin glargine-yfgn  9 Units Subcutaneous Daily   mirtazapine  15 mg Oral QHS   multivitamin  1 tablet Oral QHS   polyethylene glycol  17 g Oral Daily   sucroferric oxyhydroxide  1,500 mg Oral TID WC   Continuous Infusions:  sodium chloride Stopped (06/24/22 0023)   anticoagulant sodium citrate      ceFAZolin (ANCEF) IV Stopped (06/24/22 0022)   PRN Meds:.sodium chloride, alteplase, anticoagulant sodium citrate, heparin, HYDROmorphone (DILAUDID) injection, phenol   Irena Cords,  MD 06/24/2022, 9:10 AM

## 2022-06-24 NOTE — Progress Notes (Addendum)
Hypoglycemic Event  CBG: 64 per dexcom   Treatment: 8 oz juice/soda, peanut butter graham cracker  Symptoms: None  Follow-up CBG: Time:13:22 CBG Result: 97 per dexcom  Possible Reasons for Event: Inadequate meal intake, no breakfast this am  Comments/MD notified:hypoglycemic protocol followed    Aubryana Vittorio S Coraima Tibbs

## 2022-06-24 NOTE — Anesthesia Postprocedure Evaluation (Signed)
Anesthesia Post Note  Patient: Barbara Donovan  Procedure(s) Performed: REMOVAL OF ARTERIOVENOUS GORETEX GRAFT (AVGG) LEFT ARM WITH VEIN PATCH OF BRACHIAL ARTERY (Left: Arm Upper) INSERTION OF LEFT INTERNAL JUGULAR TRIALYSIS DIALYSIS CATHETER (Neck)     Patient location during evaluation: PACU Anesthesia Type: General Level of consciousness: patient cooperative and awake Pain management: pain level controlled Vital Signs Assessment: post-procedure vital signs reviewed and stable Respiratory status: spontaneous breathing, nonlabored ventilation, respiratory function stable and patient connected to nasal cannula oxygen Cardiovascular status: stable Postop Assessment: no apparent nausea or vomiting Anesthetic complications: no   No notable events documented.  Last Vitals:  Vitals:   06/24/22 1354 06/24/22 1438  BP: 126/78 127/73  Pulse: 93 (!) 103  Resp: 18 18  Temp: 37.6 C 37 C  SpO2: 99% 100%    Last Pain:  Vitals:   06/24/22 1438  TempSrc: Oral  PainSc:                  Elaya Droege

## 2022-06-25 DIAGNOSIS — I10 Essential (primary) hypertension: Secondary | ICD-10-CM

## 2022-06-25 DIAGNOSIS — E109 Type 1 diabetes mellitus without complications: Secondary | ICD-10-CM | POA: Diagnosis not present

## 2022-06-25 DIAGNOSIS — T827XXA Infection and inflammatory reaction due to other cardiac and vascular devices, implants and grafts, initial encounter: Secondary | ICD-10-CM | POA: Diagnosis not present

## 2022-06-25 DIAGNOSIS — R509 Fever, unspecified: Secondary | ICD-10-CM | POA: Diagnosis not present

## 2022-06-25 DIAGNOSIS — R7881 Bacteremia: Secondary | ICD-10-CM

## 2022-06-25 DIAGNOSIS — B9561 Methicillin susceptible Staphylococcus aureus infection as the cause of diseases classified elsewhere: Secondary | ICD-10-CM | POA: Diagnosis not present

## 2022-06-25 DIAGNOSIS — N186 End stage renal disease: Secondary | ICD-10-CM | POA: Diagnosis not present

## 2022-06-25 LAB — CBC WITH DIFFERENTIAL/PLATELET
Abs Immature Granulocytes: 0.4 10*3/uL — ABNORMAL HIGH (ref 0.00–0.07)
Basophils Absolute: 0.1 10*3/uL (ref 0.0–0.1)
Basophils Relative: 1 %
Eosinophils Absolute: 0.2 10*3/uL (ref 0.0–0.5)
Eosinophils Relative: 2 %
HCT: 28.6 % — ABNORMAL LOW (ref 36.0–46.0)
Hemoglobin: 8.9 g/dL — ABNORMAL LOW (ref 12.0–15.0)
Immature Granulocytes: 4 %
Lymphocytes Relative: 22 %
Lymphs Abs: 2.1 10*3/uL (ref 0.7–4.0)
MCH: 28.5 pg (ref 26.0–34.0)
MCHC: 31.1 g/dL (ref 30.0–36.0)
MCV: 91.7 fL (ref 80.0–100.0)
Monocytes Absolute: 1 10*3/uL (ref 0.1–1.0)
Monocytes Relative: 10 %
Neutro Abs: 5.7 10*3/uL (ref 1.7–7.7)
Neutrophils Relative %: 61 %
Platelets: 500 10*3/uL — ABNORMAL HIGH (ref 150–400)
RBC: 3.12 MIL/uL — ABNORMAL LOW (ref 3.87–5.11)
RDW: 17.8 % — ABNORMAL HIGH (ref 11.5–15.5)
WBC: 9.4 10*3/uL (ref 4.0–10.5)
nRBC: 0 % (ref 0.0–0.2)

## 2022-06-25 LAB — CULTURE, BLOOD (ROUTINE X 2): Culture: NO GROWTH

## 2022-06-25 LAB — RENAL FUNCTION PANEL
Albumin: 2.9 g/dL — ABNORMAL LOW (ref 3.5–5.0)
Anion gap: 18 — ABNORMAL HIGH (ref 5–15)
BUN: 32 mg/dL — ABNORMAL HIGH (ref 6–20)
CO2: 25 mmol/L (ref 22–32)
Calcium: 9.1 mg/dL (ref 8.9–10.3)
Chloride: 89 mmol/L — ABNORMAL LOW (ref 98–111)
Creatinine, Ser: 7.92 mg/dL — ABNORMAL HIGH (ref 0.44–1.00)
GFR, Estimated: 6 mL/min — ABNORMAL LOW (ref >60)
Glucose, Bld: 163 mg/dL — ABNORMAL HIGH (ref 70–99)
Phosphorus: 4.7 mg/dL — ABNORMAL HIGH (ref 2.5–4.6)
Potassium: 4.5 mmol/L (ref 3.5–5.1)
Sodium: 132 mmol/L — ABNORMAL LOW (ref 135–145)

## 2022-06-25 LAB — GLUCOSE, CAPILLARY
Glucose-Capillary: 135 mg/dL — ABNORMAL HIGH (ref 70–99)
Glucose-Capillary: 136 mg/dL — ABNORMAL HIGH (ref 70–99)
Glucose-Capillary: 149 mg/dL — ABNORMAL HIGH (ref 70–99)
Glucose-Capillary: 159 mg/dL — ABNORMAL HIGH (ref 70–99)
Glucose-Capillary: 172 mg/dL — ABNORMAL HIGH (ref 70–99)

## 2022-06-25 MED ORDER — CEFAZOLIN SODIUM-DEXTROSE 1-4 GM/50ML-% IV SOLN
1.0000 g | INTRAVENOUS | Status: DC
  Administered 2022-06-25 – 2022-06-27 (×3): 1 g via INTRAVENOUS
  Filled 2022-06-25 (×5): qty 50

## 2022-06-25 MED ORDER — VANCOMYCIN HCL 1250 MG/250ML IV SOLN
1250.0000 mg | Freq: Once | INTRAVENOUS | Status: AC
  Administered 2022-06-25: 1250 mg via INTRAVENOUS
  Filled 2022-06-25: qty 250

## 2022-06-25 NOTE — Progress Notes (Signed)
[facility] Progress Note   Subjective:    Seen and examined patient at bedside. Tolerated yesterday's HD with net UF 3L. Temp HD cath was then removed after HD 6/6. Now on line holiday. Plan for new [facility] placement on 6/10. She denies SOB, CP, and N/V.  Objective Vitals:   06/24/22 2315 06/25/22 0051 06/25/22 0420 06/25/22 0800  BP:   (!) 146/88 124/67  Pulse:   73 73  Resp:   17 18  Temp: (!) 101 F (38.3 C) 100.2 F (37.9 C) 98.3 F (36.8 C) 99.7 F (37.6 C)  TempSrc: Oral Oral Oral   SpO2:   100% 98%  Weight:      Height:       Physical Exam General: Chronically ill appearing female in NAD Heart: S1,S2 RRR No M/R/G Lungs: CTAB A/P. No WOB.  Abdomen: NABS, NT Extremities: No LE edema Dialysis Access: LIJ Temp catheter removed 6/6-now on line holiday  Filed Weights   06/22/22 0945 06/24/22 0913 06/24/22 1354  Weight: 65.8 kg 63.2 kg 60.2 kg    Intake/Output Summary (Last 24 hours) at 06/25/2022 0921 Last data filed at 06/25/2022 0441 Gross per 24 hour  Intake 914.43 ml  Output 3000 ml  Net -2085.57 ml    Additional Objective Labs: Basic Metabolic Panel: Recent Labs  Lab 06/23/22 0803 06/24/22 0353 06/25/22 0406  NA 131* 130* 132*  K 4.1 4.5 4.5  CL 91* 90* 89*  CO2 20* 23 25  GLUCOSE 87 146* 163*  BUN 58* 72* 32*  CREATININE 9.84* 12.31* 7.92*  CALCIUM 8.9 8.7* 9.1  PHOS 4.3 5.2* 4.7*   Liver Function Tests: Recent Labs  Lab 06/22/22 0825 06/23/22 0803 06/24/22 0353 06/25/22 0406  AST 20  --   --   --   ALT 33  --   --   --   ALKPHOS 99  --   --   --   BILITOT 0.8  --   --   --   PROT 7.2  --   --   --   ALBUMIN 3.2* 2.8* 2.5* 2.9*   No results for input(s): "LIPASE", "AMYLASE" in the last 168 hours. CBC: Recent Labs  Lab 06/22/22 0825 06/23/22 0803 06/24/22 0353 06/25/22 0406  WBC 13.8* 9.1 9.3 9.4  NEUTROABS 10.7*  --  6.0 5.7  HGB 8.8* 8.3* 8.3* 8.9*  HCT 26.7* 24.9* 25.7* 28.6*  MCV 89.3 89.2 90.8 91.7  PLT 335  360 423* 500*   Blood Culture    Component Value Date/Time   SDES BLOOD RIGHT FOREARM 06/23/2022 0805   SPECREQUEST  06/23/2022 0805    BOTTLES DRAWN AEROBIC AND ANAEROBIC Blood Culture adequate volume   CULT  06/23/2022 0805    NO GROWTH < 24 HOURS Performed at [facility], [address], [place], [place] [address]    REPTSTATUS PENDING 06/23/2022 6045    Cardiac Enzymes: No results for input(s): "CKTOTAL", "CKMB", "CKMBINDEX", "TROPONINI" in the last 168 hours. CBG: Recent Labs  Lab 06/24/22 1439 06/24/22 1712 06/24/22 2048 06/25/22 0054 06/25/22 0740  GLUCAP 158* 253* 75 135* 136*   Iron Studies: No results for input(s): "IRON", "TIBC", "TRANSFERRIN", "FERRITIN" in the last 72 hours. Lab Results  Component Value Date   INR 1.2 06/22/2022   INR 1.1 07/03/2021   INR 1.0 01/24/2020   Studies/Results: ECHOCARDIOGRAM COMPLETE  Result Date: 06/23/2022    ECHOCARDIOGRAM REPORT   Patient Name:   Barbara Donovan Date  of Exam: 06/23/2022 Medical Rec #:  [id number]        Height:       66.0 in Accession #:    [id number]       Weight:       145.0 lb Date of Birth:  Apr 26, 1990        BSA:          1.744 m Patient Age:    31 years         BP:           143/79 mmHg Patient Gender: F                HR:           86 bpm. Exam Location:  Inpatient Procedure: 2D Echo, Cardiac Doppler and Color Doppler Indications:    Bacteremia  History:        Patient has prior history of Echocardiogram examinations, most                 recent 07/05/2021. Risk Factors:Diabetes and Hypertension.  Sonographer:    Darlys Gales Referring Phys: Lisette Grinder [facility] IMPRESSIONS  1. Left ventricular ejection fraction, by estimation, is 55 to 60%. The left ventricle has normal function. The left ventricle has no regional wall motion abnormalities. There is mild concentric left ventricular hypertrophy. Left ventricular diastolic parameters were normal.  2. Right ventricular systolic function is normal. The right  ventricular size is normal.  3. The mitral valve is normal in structure. Trivial mitral valve regurgitation. No evidence of mitral stenosis.  4. The aortic valve is normal in structure. Aortic valve regurgitation is not visualized. No aortic stenosis is present.  5. The inferior vena cava is normal in size with greater than 50% respiratory variability, suggesting right atrial pressure of 3 mmHg. Conclusion(s)/Recommendation(s): No evidence of valvular vegetations on this transthoracic echocardiogram. Consider a transesophageal echocardiogram to exclude infective endocarditis if clinically indicated. FINDINGS  Left Ventricle: Left ventricular ejection fraction, by estimation, is 55 to 60%. The left ventricle has normal function. The left ventricle has no regional wall motion abnormalities. The left ventricular internal cavity size was normal in size. There is  mild concentric left ventricular hypertrophy. Left ventricular diastolic parameters were normal. Normal left ventricular filling pressure. Right Ventricle: The right ventricular size is normal. No increase in right ventricular wall thickness. Right ventricular systolic function is normal. Left Atrium: Left atrial size was normal in size. Right Atrium: Right atrial size was normal in size. Pericardium: Trivial pericardial effusion is present. The pericardial effusion is posterior to the left ventricle. Mitral Valve: The mitral valve is normal in structure. Trivial mitral valve regurgitation. No evidence of mitral valve stenosis. Tricuspid Valve: The tricuspid valve is normal in structure. Tricuspid valve regurgitation is trivial. No evidence of tricuspid stenosis. Aortic Valve: The aortic valve is normal in structure. Aortic valve regurgitation is not visualized. No aortic stenosis is present. Aortic valve mean gradient measures 6.0 mmHg. Aortic valve peak gradient measures 11.3 mmHg. Pulmonic Valve: The pulmonic valve was normal in structure. Pulmonic valve  regurgitation is trivial. No evidence of pulmonic stenosis. Aorta: The aortic root is normal in size and structure. Venous: The inferior vena cava is normal in size with greater than 50% respiratory variability, suggesting right atrial pressure of 3 mmHg. IAS/Shunts: No atrial level shunt detected by color flow Doppler.  LEFT VENTRICLE PLAX 2D LVIDd:         4.90 cm Diastology LVIDs:  3.50 cm LV e' medial:    8.81 cm/s LV PW:         1.30 cm LV E/e' medial:  9.0 LV IVS:        1.20 cm LV e' lateral:   11.50 cm/s                        LV E/e' lateral: 6.9  RIGHT VENTRICLE RV S prime:     14.10 cm/s TAPSE (M-mode): 2.4 cm LEFT ATRIUM             Index        RIGHT ATRIUM           Index LA Vol (A2C):   44.8 ml 25.68 ml/m  RA Area:     14.20 cm LA Vol (A4C):   42.9 ml 24.59 ml/m  RA Volume:   30.90 ml  17.71 ml/m LA Biplane Vol: 45.0 ml 25.80 ml/m  AORTIC VALVE AV Vmax:           168.00 cm/s AV Vmean:          113.000 cm/s AV VTI:            0.285 m AV Peak Grad:      11.3 mmHg AV Mean Grad:      6.0 mmHg LVOT Vmax:         154.00 cm/s LVOT Vmean:        108.000 cm/s LVOT VTI:          0.282 m LVOT/AV VTI ratio: 0.99  AORTA Ao Root diam: 2.50 cm MITRAL VALVE MV Area (PHT): 4.08 cm    SHUNTS MV Decel Time: 186 msec    Systemic VTI: 0.28 m MV E velocity: 79.70 cm/s MV A velocity: 59.20 cm/s MV E/A ratio:  1.35 Armanda Magic MD Electronically signed by Armanda Magic MD Signature Date/Time: 06/23/2022/10:51:38 AM    Final     Medications:  sodium chloride Stopped (06/25/22 0441)    amLODipine  10 mg Oral QHS   calcitRIOL  0.5 mcg Oral Daily   carvedilol  25 mg Oral BID WC   Chlorhexidine Gluconate Cloth  6 each Topical Q0600   cinacalcet  30 mg Oral Q supper   [START ON 06/29/2022] darbepoetin (ARANESP) injection - DIALYSIS  100 mcg Subcutaneous Q Tue-1800   heparin  5,000 Units Subcutaneous Q8H   insulin aspart  0-6 Units Subcutaneous TID WC   insulin glargine-yfgn  9 Units Subcutaneous Daily    mirtazapine  15 mg Oral QHS   multivitamin  1 tablet Oral QHS   polyethylene glycol  17 g Oral Daily   sucroferric oxyhydroxide  1,500 mg Oral TID WC    Dialysis Orders: OP HD orders: HHD  MTTF Dialyzes at [facility] HD,  3 hrs, 4x/week, EDW 63.4Kg. HD Bath 2k, BFR 400. Access: LUE AVG removed on 6/4. Mircera 150 mcg on 5/14 Calcitriol 0.5 mcg daily Sensipar 30 mg daily Velphoro 3 tabs w/meal  Assessment/Plan: # I. 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 S/p temp cath removal after HD 6/6. Now on line holiday. Plan for new [facility] placement on 6/10. Continue Ancef   # ESRD on HHD 4x/week: Last dialysis about 5 days ago and labs abnormal. HD 06/22/2022 finished around 5 AM today. Will use T,Th,S schedule while in hospital. Received HD 06/24/2022. Now on line holiday. See above. Current BUN 32 and K+ 4.5. She will  need to return to in-center HD while using TDC. She prefers [facility] if possible. Renal navigator notified for CLIP.    # Hypertension/volume overload: Resume home medication. HD last PM.  Net UF 3 liters. Still above OP EDW. Continue to lower EDW as tolerated.    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 Aranesp 100 mcg SQ. Follow HGB. No iron D/T infection.    # Secondary hyperparathyroidism/hyperphosphatemia: Resume calcitriol, Sensipar and binders.   # Hypervolemic hyponatremia, hyperkalemia, acidosis all due to incomplete/missing dialysis. Monitor labs while she is on line holiday.  Salome Holmes, NP [facility] 06/25/2022,9:21 AM  LOS: 2 days

## 2022-06-25 NOTE — Progress Notes (Signed)
Continue to work on clinic placement for pt due pt needing a specific schedule due to childcare needs. Will assist as needed.   Olivia Canter Renal Navigator [phone]

## 2022-06-25 NOTE — Progress Notes (Signed)
Pharmacy Antibiotic Note  Barbara Donovan is a 32 y.o. female admitted on 06/22/2022 with infection of AV graft and MSSA bacteremia, now spiking fever despite appropriate ABX therapy.  Pharmacy has been consulted for vancomycin dosing.  Pt had HD 6/6 w/ temp cath.  ID involved.  Plan: Vancomycin 1250mg  IV x1; f/u w/ ID recs and schedule vanc w/ HD if to continue vs resuming cefazolin.  Height: 5\' 6"  (167.6 cm) Weight: 60.2 kg (132 lb 11.5 oz) IBW/kg (Calculated) : 59.3  Temp (24hrs), Avg:99.5 F (37.5 C), Min:98.2 F (36.8 C), Max:101 F (38.3 C)  Recent Labs  Lab 06/22/22 0825 06/22/22 1634 06/23/22 0803 06/24/22 0353  WBC 13.8*  --  9.1 9.3  CREATININE 19.02*  --  9.84* 12.31*  LATICACIDVEN 1.2 0.7  --   --     Estimated Creatinine Clearance: 6.2 mL/min (A) (by C-G formula based on SCr of 12.31 mg/dL (H)).    No Known Allergies  Thank you for allowing pharmacy to be a part of this patient's care.  Vernard Gambles, PharmD, BCPS  06/25/2022 1:02 AM

## 2022-06-25 NOTE — Progress Notes (Addendum)
[facility]  VASCULAR SURGERY ASSESSMENT & PLAN:   POD 3 S/P REMOVAL OF INFECTED LEFT UPPER ARM GRAFT: We remove the drain today.  Continue daily dressing changes.  She has 1 open area in the central part of her upper arm that needs to continue to be packed.  Her temporary catheter is out and repeat blood cultures have been ordered.  The plan will be to place a new tunneled dialysis catheter on Monday if her blood cultures are negative.  Cari Caraway, MD 11:47 AM   Subjective  - Last HD yesterday   Objective (!) 146/88 73 98.3 F (36.8 C) (Oral) 17 100%  Intake/Output Summary (Last 24 hours) at 06/25/2022 0715 Last data filed at 06/25/2022 0441 Gross per 24 hour  Intake 914.43 ml  Output 3000 ml  Net -2085.57 ml    Left UE arm dressing clean and dry was changed this am penrose drain removed.  Patient tolerated this well. TDC site dressing clean and dry  Palpable pulse radial left UE Lungs non labored breathing   Assessment/Planning: POD # 3 excision of infected AV graft left UE.  Removal of [facility] 06/24/22 after HD.  Drain was removed.  Plan for holiday and placement of new [facility] Monday 06/28/22 Continue left UE dressing changes wet to dry packing  IV antibiotics per ID.  The intraoperative culture had Staph aureus.  The patient is currently on IV Ancef.  Wound has no surrounding erythema or purulent drainage.    Mosetta Pigeon 06/25/2022 7:15 AM --  Laboratory Lab Results: Recent Labs    06/24/22 0353 06/25/22 0406  WBC 9.3 9.4  HGB 8.3* 8.9*  HCT 25.7* 28.6*  PLT 423* 500*   BMET Recent Labs    06/24/22 0353 06/25/22 0406  NA 130* 132*  K 4.5 4.5  CL 90* 89*  CO2 23 25  GLUCOSE 146* 163*  BUN 72* 32*  CREATININE 12.31* 7.92*  CALCIUM 8.7* 9.1    COAG Lab Results  Component Value Date   INR 1.2 06/22/2022   INR 1.1 07/03/2021   INR 1.0 01/24/2020   No results found for: "PTT"

## 2022-06-25 NOTE — Progress Notes (Signed)
Called by nurse Charito about fever to 100.9 F. Repeat an hour or two later remained elevated to 101. Evaluated at bedside. Person had no new symptoms. Review systems negative for chills, sweats, aches, rigors, breathing difficulty, abdominal pain, nausea.  BP 130/89 (BP Location: Right Wrist)   Pulse 80   Temp 100.2 F (37.9 C) (Oral)   Resp 17   Ht 5\' 6"  (1.676 m)   Wt 60.2 kg   SpO2 100%   BMI 21.42 kg/m   Sleeping but easily roused Heart rate normal, rhythm is regular, left radial pulse strong Breathing regular and unlabored on room air Skin warm and dry, right upper extremity PIVs clean without erythema, site of HD cath left IJ (since removed) clean without erythema or drainage, left AV brachiocephalic fistula with soiled gauze draining serosanguinous fluid Alert and oriented  Isolated elevated temperature in this person admitted for infected AV fistula and MSSA bacteremia. She is s/p HD via left IJ catheter which was removed 06/24/22. No signs of new infection tonight, but given that this person will need tunneled HD cath placement until definitive access can be established, will repeat cultures to ensure clearance prior to new line placement. Reassuringly without new symptoms or other vital sign abnormalities. Not septic. Fevers are probably from persistent MSSA bacteremia rather than new infection but will also order a dose of vancomycin given fevers despite IV cefazolin for the last 2-3 days.  Marrianne Mood MD 06/25/2022, 1:08 AM

## 2022-06-25 NOTE — Progress Notes (Addendum)
Subjective: No new complaints   Antibiotics:  Anti-infectives (From admission, onward)    Start     Dose/Rate Route Frequency Ordered Stop   06/25/22 2200  ceFAZolin (ANCEF) IVPB 1 g/50 mL premix        1 g 100 mL/hr over 30 Minutes Intravenous Every 24 hours 06/25/22 1001     06/25/22 0130  vancomycin (VANCOREADY) IVPB 1250 mg/250 mL        1,250 mg 166.7 mL/hr over 90 Minutes Intravenous  Once 06/25/22 0043 06/25/22 0441   06/23/22 2200  ceFAZolin (ANCEF) IVPB 1 g/50 mL premix  Status:  Discontinued        1 g 100 mL/hr over 30 Minutes Intravenous Every 24 hours 06/22/22 2313 06/25/22 0042   06/23/22 1000  ceFEPIme (MAXIPIME) 1 g in sodium chloride 0.9 % 100 mL IVPB  Status:  Discontinued        1 g 200 mL/hr over 30 Minutes Intravenous Every 24 hours 06/22/22 0923 06/22/22 2259   06/23/22 0915  ceFEPIme (MAXIPIME) 2 g in sodium chloride 0.9 % 100 mL IVPB  Status:  Discontinued        2 g 200 mL/hr over 30 Minutes Intravenous Every 24 hours 06/22/22 0921 06/22/22 0932   06/22/22 2315  ceFAZolin (ANCEF) IVPB 1 g/50 mL premix        1 g 100 mL/hr over 30 Minutes Intravenous  Once 06/22/22 2313 06/23/22 0657   06/22/22 2300  vancomycin (VANCOREADY) IVPB 750 mg/150 mL  Status:  Discontinued        750 mg 150 mL/hr over 60 Minutes Intravenous Every Tue (Hemodialysis) 06/22/22 2201 06/22/22 2259   06/22/22 0945  ceFEPIme (MAXIPIME) 2 g in sodium chloride 0.9 % 100 mL IVPB        2 g 200 mL/hr over 30 Minutes Intravenous  Once 06/22/22 0932 06/22/22 1551   06/22/22 0915  vancomycin (VANCOREADY) IVPB 1500 mg/300 mL        1,500 mg 150 mL/hr over 120 Minutes Intravenous  Once 06/22/22 0907 06/22/22 1551   06/22/22 0915  ceFEPIme (MAXIPIME) 1 g in sodium chloride 0.9 % 100 mL IVPB  Status:  Discontinued        1 g 200 mL/hr over 30 Minutes Intravenous Every 24 hours 06/22/22 0907 06/22/22 0921   06/22/22 0911  vancomycin variable dose per unstable renal function  (pharmacist dosing)  Status:  Discontinued         Does not apply See admin instructions 06/22/22 0911 06/22/22 2259       Medications: Scheduled Meds:  amLODipine  10 mg Oral QHS   calcitRIOL  0.5 mcg Oral Daily   carvedilol  25 mg Oral BID WC   Chlorhexidine Gluconate Cloth  6 each Topical Q0600   cinacalcet  30 mg Oral Q supper   [START ON 06/29/2022] darbepoetin (ARANESP) injection - DIALYSIS  100 mcg Subcutaneous Q Tue-1800   heparin  5,000 Units Subcutaneous Q8H   insulin aspart  0-6 Units Subcutaneous TID WC   insulin glargine-yfgn  9 Units Subcutaneous Daily   mirtazapine  15 mg Oral QHS   multivitamin  1 tablet Oral QHS   polyethylene glycol  17 g Oral Daily   sucroferric oxyhydroxide  1,500 mg Oral TID WC   Continuous Infusions:  sodium chloride Stopped (06/25/22 0441)    ceFAZolin (ANCEF) IV     PRN Meds:.sodium chloride, acetaminophen, HYDROmorphone (DILAUDID) injection, phenol  Objective: Weight change:   Intake/Output Summary (Last 24 hours) at 06/25/2022 1110 Last data filed at 06/25/2022 0441 Gross per 24 hour  Intake 914.43 ml  Output 3000 ml  Net -2085.57 ml   Blood pressure 124/67, pulse 73, temperature 99.7 F (37.6 C), resp. rate 18, height 5\' 6"  (1.676 m), weight 60.2 kg, SpO2 98 %. Temp:  [98.2 F (36.8 C)-101 F (38.3 C)] 99.7 F (37.6 C) (06/07 0800) Pulse Rate:  [73-103] 73 (06/07 0800) Resp:  [13-19] 18 (06/07 0800) BP: (90-146)/(52-92) 124/67 (06/07 0800) SpO2:  [95 %-100 %] 98 % (06/07 0800) Weight:  [60.2 kg] 60.2 kg (06/06 1354)  Physical Exam: Physical Exam Constitutional:      General: She is not in acute distress.    Appearance: She is well-developed. She is not diaphoretic.  HENT:     Head: Normocephalic and atraumatic.     Right Ear: External ear normal.     Left Ear: External ear normal.     Mouth/Throat:     Pharynx: No oropharyngeal exudate.  Eyes:     General: No scleral icterus.    Conjunctiva/sclera: Conjunctivae  normal.     Pupils: Pupils are equal, round, and reactive to light.  Cardiovascular:     Rate and Rhythm: Normal rate and regular rhythm.     Heart sounds: No murmur heard. Pulmonary:     Effort: Pulmonary effort is normal. No respiratory distress.     Breath sounds: No wheezing.  Abdominal:     General: Bowel sounds are normal. There is no distension.     Palpations: Abdomen is soft.  Musculoskeletal:        General: No tenderness. Normal range of motion.  Lymphadenopathy:     Cervical: No cervical adenopathy.  Skin:    General: Skin is warm and dry.     Coloration: Skin is not pale.     Findings: No erythema or rash.  Neurological:     General: No focal deficit present.     Mental Status: She is alert and oriented to person, place, and time.     Motor: No abnormal muscle tone.     Coordination: Coordination normal.  Psychiatric:        Mood and Affect: Mood normal.        Behavior: Behavior normal.        Thought Content: Thought content normal.        Judgment: Judgment normal.     AVG site clean  IJ removed  CBC:    BMET Recent Labs    06/24/22 0353 06/25/22 0406  NA 130* 132*  K 4.5 4.5  CL 90* 89*  CO2 23 25  GLUCOSE 146* 163*  BUN 72* 32*  CREATININE 12.31* 7.92*  CALCIUM 8.7* 9.1     Liver Panel  Recent Labs    06/24/22 0353 06/25/22 0406  ALBUMIN 2.5* 2.9*       Sedimentation Rate No results for input(s): "ESRSEDRATE" in the last 72 hours. C-Reactive Protein No results for input(s): "CRP" in the last 72 hours.  Micro Results: Recent Results (from the past 720 hour(s))  Culture, blood (Routine x 2)     Status: Abnormal   Collection Time: 06/22/22  7:37 AM   Specimen: BLOOD RIGHT ARM  Result Value Ref Range Status   Specimen Description BLOOD RIGHT ARM  Final   Special Requests   Final    BOTTLES DRAWN AEROBIC AND ANAEROBIC Blood Culture adequate volume  Culture  Setup Time   Final    GRAM POSITIVE COCCI IN CLUSTERS IN BOTH  AEROBIC AND ANAEROBIC BOTTLES CRITICAL RESULT CALLED TO, READ BACK BY AND VERIFIED WITH: PHARMD JAMES LEDFORD ON 06/22/22 @ 2239 BY DRT Performed at [facility], [address], [place], [place] [address]    Culture STAPHYLOCOCCUS AUREUS (A)  Final   Report Status 06/24/2022 FINAL  Final   Organism ID, Bacteria STAPHYLOCOCCUS AUREUS  Final      Susceptibility   Staphylococcus aureus - MIC*    CIPROFLOXACIN <=0.5 SENSITIVE Sensitive     ERYTHROMYCIN >=8 RESISTANT Resistant     GENTAMICIN <=0.5 SENSITIVE Sensitive     OXACILLIN <=0.25 SENSITIVE Sensitive     TETRACYCLINE >=16 RESISTANT Resistant     VANCOMYCIN 1 SENSITIVE Sensitive     TRIMETH/SULFA <=10 SENSITIVE Sensitive     CLINDAMYCIN <=0.25 SENSITIVE Sensitive     RIFAMPIN <=0.5 SENSITIVE Sensitive     Inducible Clindamycin NEGATIVE Sensitive     LINEZOLID 2 SENSITIVE Sensitive     * STAPHYLOCOCCUS AUREUS  Blood Culture ID Panel (Reflexed)     Status: Abnormal   Collection Time: 06/22/22  7:37 AM  Result Value Ref Range Status   Enterococcus faecalis NOT DETECTED NOT DETECTED Final   Enterococcus Faecium NOT DETECTED NOT DETECTED Final   Listeria monocytogenes NOT DETECTED NOT DETECTED Final   Staphylococcus species DETECTED (A) NOT DETECTED Final    Comment: CRITICAL RESULT CALLED TO, READ BACK BY AND VERIFIED WITH: PHARMD JAMES LEDFORD ON 06/22/22 @ 2239 BY DRT    Staphylococcus aureus (BCID) DETECTED (A) NOT DETECTED Final    Comment: CRITICAL RESULT CALLED TO, READ BACK BY AND VERIFIED WITH: PHARMD JAMES LEDFORD ON 06/22/22 @ 2239 BY DRT    Staphylococcus epidermidis NOT DETECTED NOT DETECTED Final   Staphylococcus lugdunensis NOT DETECTED NOT DETECTED Final   Streptococcus species NOT DETECTED NOT DETECTED Final   Streptococcus agalactiae NOT DETECTED NOT DETECTED Final   Streptococcus pneumoniae NOT DETECTED NOT DETECTED Final   Streptococcus pyogenes NOT DETECTED NOT DETECTED Final   A.calcoaceticus-baumannii NOT  DETECTED NOT DETECTED Final   Bacteroides fragilis NOT DETECTED NOT DETECTED Final   Enterobacterales NOT DETECTED NOT DETECTED Final   Enterobacter cloacae complex NOT DETECTED NOT DETECTED Final   Escherichia coli NOT DETECTED NOT DETECTED Final   Klebsiella aerogenes NOT DETECTED NOT DETECTED Final   Klebsiella oxytoca NOT DETECTED NOT DETECTED Final   Klebsiella pneumoniae NOT DETECTED NOT DETECTED Final   Proteus species NOT DETECTED NOT DETECTED Final   Salmonella species NOT DETECTED NOT DETECTED Final   Serratia marcescens NOT DETECTED NOT DETECTED Final   Haemophilus influenzae NOT DETECTED NOT DETECTED Final   Neisseria meningitidis NOT DETECTED NOT DETECTED Final   Pseudomonas aeruginosa NOT DETECTED NOT DETECTED Final   Stenotrophomonas maltophilia NOT DETECTED NOT DETECTED Final   Candida albicans NOT DETECTED NOT DETECTED Final   Candida auris NOT DETECTED NOT DETECTED Final   Candida glabrata NOT DETECTED NOT DETECTED Final   Candida krusei NOT DETECTED NOT DETECTED Final   Candida parapsilosis NOT DETECTED NOT DETECTED Final   Candida tropicalis NOT DETECTED NOT DETECTED Final   Cryptococcus neoformans/gattii NOT DETECTED NOT DETECTED Final   Meth resistant mecA/C and MREJ NOT DETECTED NOT DETECTED Final    Comment: Performed at [facility], [address] [place], [place] [address]  Aerobic/Anaerobic Culture w Gram Stain (surgical/deep wound)     Status:  None (Preliminary result)   Collection Time: 06/22/22 10:52 AM   Specimen: Wound; Body Fluid  Result Value Ref Range Status   Specimen Description FLUID  Final   Special Requests LEFT ARM PERI GRAFT  Final   Gram Stain   Final    MODERATE WBC PRESENT, PREDOMINANTLY PMN RARE SQUAMOUS EPITHELIAL CELLS PRESENT FEW GRAM POSITIVE COCCI Performed at [facility], [address] [place], [place] [address]    Culture   Final    FEW STAPHYLOCOCCUS AUREUS NO ANAEROBES ISOLATED; CULTURE IN PROGRESS FOR 5  DAYS    Report Status PENDING  Incomplete   Organism ID, Bacteria STAPHYLOCOCCUS AUREUS  Final      Susceptibility   Staphylococcus aureus - MIC*    CIPROFLOXACIN <=0.5 SENSITIVE Sensitive     ERYTHROMYCIN >=8 RESISTANT Resistant     GENTAMICIN <=0.5 SENSITIVE Sensitive     OXACILLIN <=0.25 SENSITIVE Sensitive     TETRACYCLINE >=16 RESISTANT Resistant     VANCOMYCIN <=0.5 SENSITIVE Sensitive     TRIMETH/SULFA <=10 SENSITIVE Sensitive     CLINDAMYCIN <=0.25 SENSITIVE Sensitive     RIFAMPIN <=0.5 SENSITIVE Sensitive     Inducible Clindamycin NEGATIVE Sensitive     LINEZOLID 2 SENSITIVE Sensitive     * FEW STAPHYLOCOCCUS AUREUS  Culture, blood (Routine X 2) w Reflex to ID Panel     Status: None (Preliminary result)   Collection Time: 06/23/22  8:03 AM   Specimen: BLOOD  Result Value Ref Range Status   Specimen Description BLOOD RIGHT ANTECUBITAL  Final   Special Requests   Final    BOTTLES DRAWN AEROBIC AND ANAEROBIC Blood Culture results may not be optimal due to an inadequate volume of blood received in culture bottles   Culture   Final    NO GROWTH < 24 HOURS Performed at [facility], [address], [place], [place] [address]    Report Status PENDING  Incomplete  Culture, blood (Routine X 2) w Reflex to ID Panel     Status: None (Preliminary result)   Collection Time: 06/23/22  8:05 AM   Specimen: BLOOD RIGHT FOREARM  Result Value Ref Range Status   Specimen Description BLOOD RIGHT FOREARM  Final   Special Requests   Final    BOTTLES DRAWN AEROBIC AND ANAEROBIC Blood Culture adequate volume   Culture   Final    NO GROWTH < 24 HOURS Performed at [facility], [address], [place], [place] [address]    Report Status PENDING  Incomplete    Studies/Results: No results found.    Assessment/Plan:  INTERVAL HISTORY: low grade temp overnight   Principal Problem:   Infection of AV graft for dialysis [facility]) Active Problems:   MSSA  bacteremia    Barbara Donovan is a 32 y.o. female with end-stage renal disease on hemodialysis admitted with AV graft infection with MSSA bacteremia.  She had had temporary dialysis catheter placed in the context of her bacteremia.  This is now been removed.  To have a low-grade temperature overnight.  #1 AVG infection with MSSA  Regards to duration of therapy I would give her 6 weeks of therapy for an endovascular infection.  That being said the fact that she has had 2 episodes of MSSA bacteremia in this past year would make me want a TEE to investigate for native valve endocarditis  Repeat blood cultures have been taken after HD temp catheter removed  #2 Fevers; I  would expect all due to #1 but will order CXR to look for ex for septic emboli  Patient was given a dose of vancomycin but we have switched her back to cefazolin  I have personally spent 50 minutes involved in face-to-face and non-face-to-face activities for this patient on the day of the visit. Professional time spent includes the following activities: Preparing to see the patient (review of tests), Obtaining and/or reviewing separately obtained history (admission/discharge record), Performing a medically appropriate examination and/or evaluation , Ordering medications/tests/procedures, referring and communicating with other health care professionals, Documenting clinical information in the EMR, Independently interpreting results (not separately reported), Communicating results to the patient/family/caregiver, Counseling and educating the patient/family/caregiver and Care coordination (not separately reported).   Dr. Drue Second is covering this weekend for questions and I will be back on Monday.   LOS: 2 days   Acey Lav 06/25/2022, 11:10 AM

## 2022-06-25 NOTE — Progress Notes (Addendum)
HD#3 Subjective:   Summary: Barbara Donovan is a 32 y.o. female with pertinent PMH of ESRD on HD at home, T1DM, and HTN who presented with infected AV graft and is admitted for MSSA bacteremia 2/2 infected AV graft.   Overnight patient had a fever and was given a dose of vancomycin and had blood cultures redrawn.    Patient is seen at bedside this AM. Feels better than when she came in but feels the same as yesterday. Reports she didn't feel the fever last night. Still has some LUE discomfort by the graft removal incision, but this is not new. Reports that her sugars were low yesterday according to her dexcom but felt better after receiving something to eat. Her appetite is good. She reports not making urine since Wednesday night.    Objective:  Vital signs in last 24 hours: Vitals:   06/24/22 2315 06/25/22 0051 06/25/22 0420 06/25/22 0800  BP:   (!) 146/88 124/67  Pulse:   73 73  Resp:   17 18  Temp: (!) 101 F (38.3 C) 100.2 F (37.9 C) 98.3 F (36.8 C) 99.7 F (37.6 C)  TempSrc: Oral Oral Oral   SpO2:   100% 98%  Weight:      Height:       Supplemental O2: Room Air SpO2: 98 % O2 Flow Rate (L/min): 2 L/min  Scheduled Meds:  amLODipine  10 mg Oral QHS   calcitRIOL  0.5 mcg Oral Daily   carvedilol  25 mg Oral BID WC   Chlorhexidine Gluconate Cloth  6 each Topical Q0600   cinacalcet  30 mg Oral Q supper   [START ON 06/29/2022] darbepoetin (ARANESP) injection - DIALYSIS  100 mcg Subcutaneous Q Tue-1800   heparin  5,000 Units Subcutaneous Q8H   insulin aspart  0-6 Units Subcutaneous TID WC   insulin glargine-yfgn  9 Units Subcutaneous Daily   mirtazapine  15 mg Oral QHS   multivitamin  1 tablet Oral QHS   polyethylene glycol  17 g Oral Daily   sucroferric oxyhydroxide  1,500 mg Oral TID WC   Continuous Infusions:  sodium chloride Stopped (06/25/22 0441)    ceFAZolin (ANCEF) IV     PRN Meds: sodium chloride, acetaminophen, HYDROmorphone (DILAUDID) injection,  phenol  Physical Exam:  Constitutional: Tired appearing adult female, in no acute distress HENT: normocephalic atraumatic, mucous membranes moist Eyes: conjunctiva non-erythematous Neck: supple with gauze covering where previous catheter was placed.  Cardiovascular: regular rate and rhythm, no murmur Pulmonary/Chest: normal work of breathing on room air MSK: normal bulk and tone Neurological: alert & oriented x 3, No focal deficits noted Skin: warm and dry  Filed Weights   06/22/22 0945 06/24/22 0913 06/24/22 1354  Weight: 65.8 kg 63.2 kg 60.2 kg     Intake/Output Summary (Last 24 hours) at 06/25/2022 1054 Last data filed at 06/25/2022 0441 Gross per 24 hour  Intake 914.43 ml  Output 3000 ml  Net -2085.57 ml   Net IO Since Admission: -350.73 mL [06/25/22 1054]  Pertinent Labs:    Latest Ref Rng & Units 06/25/2022    4:06 AM 06/24/2022    3:53 AM 06/23/2022    8:03 AM  CBC  WBC 4.0 - 10.5 K/uL 9.4  9.3  9.1   Hemoglobin 12.0 - 15.0 g/dL 8.9  8.3  8.3   Hematocrit 36.0 - 46.0 % 28.6  25.7  24.9   Platelets 150 - 400 K/uL 500  423  360  Latest Ref Rng & Units 06/25/2022    4:06 AM 06/24/2022    3:53 AM 06/23/2022    8:03 AM  CMP  Glucose 70 - 99 mg/dL 161  096  87   BUN 6 - 20 mg/dL 32  72  58   Creatinine 0.44 - 1.00 mg/dL 0.45  40.98  1.19   Sodium 135 - 145 mmol/L 132  130  131   Potassium 3.5 - 5.1 mmol/L 4.5  4.5  4.1   Chloride 98 - 111 mmol/L 89  90  91   CO2 22 - 32 mmol/L 25  23  20    Calcium 8.9 - 10.3 mg/dL 9.1  8.7  8.9     Assessment/Plan:   Principal Problem:   Infection of AV graft for dialysis [facility]) Active Problems:   MSSA bacteremia   Patient Summary: Barbara Donovan is a 32 y.o. female with pertinent PMH of ESRD on HD at home, T1DM, and HTN who presented with infected AV graft and is admitted for MSSA bacteremia 2/2 infected AV graft, on hospital day 3.  MSSA bacteremia 2/2 infected LUE AV graft S/p AV graft extraction on 06/22/2022.  Fever  overnight. One dose of vancomycin given after receiving cefazolin for 3 days.  Repeat blood cultures taken last night after line removal. ID following and recommend TEE since this is patient's second episode of MSSA bacteremia and want to evaluate for native valve endocarditis. Currently on line holiday until 6/10. - Continue cefazolin  - monitor blood cultures - Stop vancomycin  - TEE  ESRD on HD at home Uremia with AGMA and hyperkalemia Underwent dialysis yesterday morning with net UF 3 liters and removed HD catheter. IR to place the new [facility] on Monday 6/10. Continues to have an anion gap of 18 with a BUN of 32, potassium stable 4.5.   - AM renal function panel  T1DM A1c 5.8 here down from 6.4.  Blood sugars are stable on insulin glargine 9 units daily and sliding scale insulin.  Hypertension Stable this morning at 124/67. On home carvedilol 25 mg twice daily and amlodipine 10 mg QHS.  Right upper lobe lung nodule Seen on chest x-ray.  Will need follow-up outpatient.  Diet: Renal VTE: Heparin Code: Full  Dispo: Anticipated discharge to Home once her infection is cleared and we are able to place long-term dialysis access.  Annette Stable, Medical Student 06/25/2022, 10:54 AM  Pager: [phone] Please contact the on call pager after 5 pm and on weekends at [phone].

## 2022-06-25 NOTE — Care Management Important Message (Signed)
Important Message  Patient Details  Name: Barbara Donovan MRN: [id number] Date of Birth: 1990-06-06   Medicare Important Message Given:  Yes     Chasen Mendell 06/25/2022, 3:54 PM

## 2022-06-26 ENCOUNTER — Inpatient Hospital Stay (HOSPITAL_COMMUNITY): Payer: 59

## 2022-06-26 LAB — GLUCOSE, CAPILLARY
Glucose-Capillary: 127 mg/dL — ABNORMAL HIGH (ref 70–99)
Glucose-Capillary: 243 mg/dL — ABNORMAL HIGH (ref 70–99)
Glucose-Capillary: 163 mg/dL — ABNORMAL HIGH (ref 70–99)
Glucose-Capillary: 198 mg/dL — ABNORMAL HIGH (ref 70–99)

## 2022-06-26 LAB — CBC WITH DIFFERENTIAL/PLATELET
Abs Immature Granulocytes: 0.45 10*3/uL — ABNORMAL HIGH (ref 0.00–0.07)
Basophils Absolute: 0.1 10*3/uL (ref 0.0–0.1)
Basophils Relative: 1 %
Eosinophils Absolute: 0.3 10*3/uL (ref 0.0–0.5)
Eosinophils Relative: 3 %
HCT: 27 % — ABNORMAL LOW (ref 36.0–46.0)
Hemoglobin: 8.5 g/dL — ABNORMAL LOW (ref 12.0–15.0)
Immature Granulocytes: 4 %
Lymphocytes Relative: 25 %
Lymphs Abs: 2.7 10*3/uL (ref 0.7–4.0)
MCH: 29.5 pg (ref 26.0–34.0)
MCHC: 31.5 g/dL (ref 30.0–36.0)
MCV: 93.8 fL (ref 80.0–100.0)
Monocytes Absolute: 0.9 10*3/uL (ref 0.1–1.0)
Monocytes Relative: 9 %
Neutro Abs: 6.2 10*3/uL (ref 1.7–7.7)
Neutrophils Relative %: 58 %
Platelets: 526 10*3/uL — ABNORMAL HIGH (ref 150–400)
RBC: 2.88 MIL/uL — ABNORMAL LOW (ref 3.87–5.11)
RDW: 17.6 % — ABNORMAL HIGH (ref 11.5–15.5)
WBC: 10.6 10*3/uL — ABNORMAL HIGH (ref 4.0–10.5)
nRBC: 0 % (ref 0.0–0.2)

## 2022-06-26 LAB — RENAL FUNCTION PANEL
Albumin: 2.7 g/dL — ABNORMAL LOW (ref 3.5–5.0)
Anion gap: 18 — ABNORMAL HIGH (ref 5–15)
BUN: 52 mg/dL — ABNORMAL HIGH (ref 6–20)
CO2: 24 mmol/L (ref 22–32)
Calcium: 9.4 mg/dL (ref 8.9–10.3)
Chloride: 88 mmol/L — ABNORMAL LOW (ref 98–111)
Creatinine, Ser: 10.81 mg/dL — ABNORMAL HIGH (ref 0.44–1.00)
GFR, Estimated: 4 mL/min — ABNORMAL LOW (ref >60)
Glucose, Bld: 148 mg/dL — ABNORMAL HIGH (ref 70–99)
Phosphorus: 5.3 mg/dL — ABNORMAL HIGH (ref 2.5–4.6)
Potassium: 4.4 mmol/L (ref 3.5–5.1)
Sodium: 130 mmol/L — ABNORMAL LOW (ref 135–145)

## 2022-06-26 LAB — CULTURE, BLOOD (ROUTINE X 2)

## 2022-06-26 MED ORDER — SENNOSIDES-DOCUSATE SODIUM 8.6-50 MG PO TABS
1.0000 | ORAL_TABLET | Freq: Two times a day (BID) | ORAL | Status: DC
  Administered 2022-06-26 – 2022-06-29 (×5): 1 via ORAL
  Filled 2022-06-26 (×6): qty 1

## 2022-06-26 NOTE — Progress Notes (Signed)
Internal Medicine Attending:   I discussed the care plan with the resident physician today. I reviewed the resident's note and I agree with the resident's findings and plan as documented in the resident's note.

## 2022-06-26 NOTE — Progress Notes (Addendum)
NAME:  Barbara Donovan, MRN:  [id number], DOB:  October 19, 1990, LOS: 3 ADMISSION DATE:  06/22/2022  Subjective  NAEON/Overnight events: Dilaudid 0.5 IV prn given. Pain 7/10.  Patient evaluated at bedside this AM. Doing well. Reports a productive cough for weeks, producing brown/grey sputum. No CP or SOB. No urine since Wednesday night and no BM since Monday. Does not feel constipated. Has a good appetite, ate eggs for breakfast this morning.   Objective   Vital signs in last 24 hours: Vitals:   06/25/22 0800 06/25/22 1655 06/25/22 2112 06/26/22 0554  BP: 124/67 131/65 125/67 130/83  Pulse: 73 72 77 89  Resp: 18 17 18 18   Temp: 99.7 F (37.6 C) 98.5 F (36.9 C) 98.9 F (37.2 C) 98 F (36.7 C)  TempSrc:   Oral   SpO2: 98% 99% 100% 96%  Weight:      Height:       Blood pressure 130/83, pulse 89, temperature 98 F (36.7 C), resp. rate 18, height 5\' 6"  (1.676 m), weight 60.2 kg, SpO2 96 %.  Constitutional: Tired appearing adult female, in no acute distress HENT: normocephalic atraumatic Eyes: conjunctiva non-erythematous Neck: supple no JVD.  Cardiovascular: regular rate and rhythm, no murmur Pulmonary/Chest: normal work of breathing on room air, CTAB. GI: Abdomen soft, non-tender, non-distended. Normoactive bowel sounds. Neurological: alert & oriented x 3, No focal deficits noted Skin: warm and dry    Intake/Output Summary (Last 24 hours) at 06/26/2022 0735 Last data filed at 06/26/2022 0701 Gross per 24 hour  Intake 950 ml  Output 0 ml  Net 950 ml     Labs       Latest Ref Rng & Units 06/26/2022    2:58 AM 06/25/2022    4:06 AM 06/24/2022    3:53 AM  CBC  WBC 4.0 - 10.5 K/uL 10.6  9.4  9.3   Hemoglobin 12.0 - 15.0 g/dL 8.5  8.9  8.3   Hematocrit 36.0 - 46.0 % 27.0  28.6  25.7   Platelets 150 - 400 K/uL 526  500  423       Latest Ref Rng & Units 06/26/2022    2:58 AM 06/25/2022    4:06 AM 06/24/2022    3:53 AM  CMP  Glucose 70 - 99 mg/dL 409  811  914   BUN 6 - 20 mg/dL 52   32  72   Creatinine 0.44 - 1.00 mg/dL 78.29  5.62  13.08   Sodium 135 - 145 mmol/L 130  132  130   Potassium 3.5 - 5.1 mmol/L 4.4  4.5  4.5   Chloride 98 - 111 mmol/L 88  89  90   CO2 22 - 32 mmol/L 24  25  23    Calcium 8.9 - 10.3 mg/dL 9.4  9.1  8.7     Blood Culture    Component Value Date/Time   SDES BLOOD RIGHT HAND 06/25/2022 0407   SPECREQUEST  06/25/2022 0407    BOTTLES DRAWN AEROBIC AND ANAEROBIC Blood Culture adequate volume   CULT  06/25/2022 0407    NO GROWTH 1 DAY Performed at [facility], [address], [place], [place] [address]    REPTSTATUS PENDING 06/25/2022 0407    Susceptibility data from last 90 days. Collected Specimen Info Organism Ciprofloxacin Clindamycin Erythromycin Gentamicin Susc lslt Inducible Clindamycin Linezolid Oxacillin Rifampin TELAVANCIN TETRACYCLINE Trimethoprim/Sulfa  06/22/22 Body Fluid Staphylococcus aureus  S  S  R  S  S  S  S  S  S  R  S  06/22/22 Blood from BLOOD RIGHT ARM Staphylococcus aureus  S  S  R  S  S  S  S  S  S  R  S     Imaging   No results found.  Inpatient medications   Scheduled Meds:  amLODipine  10 mg Oral QHS   calcitRIOL  0.5 mcg Oral Daily   carvedilol  25 mg Oral BID WC   Chlorhexidine Gluconate Cloth  6 each Topical Q0600   cinacalcet  30 mg Oral Q supper   [START ON 06/29/2022] darbepoetin (ARANESP) injection - DIALYSIS  100 mcg Subcutaneous Q Tue-1800   heparin  5,000 Units Subcutaneous Q8H   insulin aspart  0-6 Units Subcutaneous TID WC   insulin glargine-yfgn  9 Units Subcutaneous Daily   mirtazapine  15 mg Oral QHS   multivitamin  1 tablet Oral QHS   polyethylene glycol  17 g Oral Daily   sucroferric oxyhydroxide  1,500 mg Oral TID WC   Continuous Infusions:  sodium chloride Stopped (06/25/22 0441)    ceFAZolin (ANCEF) IV Stopped (06/25/22 2237)   PRN Meds: sodium chloride, acetaminophen, HYDROmorphone (DILAUDID) injection, phenol   Summary  Barbara Donovan is a 32 y.o. female with  pertinent PMH of ESRD on HD at home, T1DM, and HTN who presented with infected AV graft and is admitted for MSSA bacteremia 2/2 infected AV graft, on hospital day 4.  Assessment & Plan:  Principal Problem:   Infection of AV graft for dialysis [facility]) Active Problems:   MSSA bacteremia  MSSA bacteremia 2/2 infected LUE AV graft S/p AV graft extraction on 06/22/2022. Admission BC (06/22/22) shows MSSA, started on Cefazolin. Repeat BC (06/23/22) show no growth in 3 days. Patient had repeat cultures (06/25/22) taken after line removal with no growth in 1 day. Patient is reporting a productive cough. 2 view CXR was clear. Will start incentive spirometry. ID following and recommend TEE since this is patient's second episode of MSSA bacteremia and want to evaluate for native valve endocarditis. Currently on line holiday until 6/10. - Continue cefazolin  - incentive spirometry - monitor blood cultures - TEE on Tuesday   ESRD on HD at home Uremia with AGMA and hyperkalemia On line holiday. IR to place the new [facility] on Monday 6/10 with HD after. Continues to have an anion gap of 18 with a BUN of 52, potassium stable 4.4.   - AM renal function panel - TDC on Monday    T1DM A1c 5.8 here down from 6.4.  Blood sugars are stable on insulin glargine 9 units daily and sliding scale insulin.   Hypertension Stable this morning at 127/71. On home carvedilol 25 mg twice daily and amlodipine 10 mg QHS.   Right upper lobe lung nodule Seen on chest x-ray.  Will need follow-up outpatient.  Best practice:  Diet: Renal VTE: Heparin Code: Full  Dispo: Anticipated discharge in approximately 2 day(s).   Annette Stable, Medical Student 06/26/2022, 7:35 AM Pager: [phone] After 5pm on weekdays and 1pm on weekends: On Call pager [phone]

## 2022-06-26 NOTE — Progress Notes (Signed)
[facility] Progress Note   Subjective:    Seen and examined patient at bedside. No acute issues. Denies SOB, CP, and N/V. K+ 4.4 and BUN 52. On line holiday. Plan for new [facility] placement in IR on Monday 6/10.  Objective Vitals:   06/25/22 1655 06/25/22 2112 06/26/22 0554 06/26/22 0920  BP: 131/65 125/67 130/83 127/71  Pulse: 72 77 89 78  Resp: 17 18 18 19   Temp: 98.5 F (36.9 C) 98.9 F (37.2 C) 98 F (36.7 C) 98.6 F (37 C)  TempSrc:  Oral  Oral  SpO2: 99% 100% 96% 100%  Weight:      Height:       Physical Exam General: Chronically ill appearing female in NAD Heart: S1,S2 RRR No M/R/G Lungs: CTAB A/P. No WOB.  Abdomen: NABS, NT Extremities: No LE edema Dialysis Access: LIJ Temp catheter removed 6/6-now on line holiday  Filed Weights   06/22/22 0945 06/24/22 0913 06/24/22 1354  Weight: 65.8 kg 63.2 kg 60.2 kg    Intake/Output Summary (Last 24 hours) at 06/26/2022 1110 Last data filed at 06/26/2022 0900 Gross per 24 hour  Intake 890 ml  Output 0 ml  Net 890 ml    Additional Objective Labs: Basic Metabolic Panel: Recent Labs  Lab 06/24/22 0353 06/25/22 0406 06/26/22 0258  NA 130* 132* 130*  K 4.5 4.5 4.4  CL 90* 89* 88*  CO2 23 25 24   GLUCOSE 146* 163* 148*  BUN 72* 32* 52*  CREATININE 12.31* 7.92* 10.81*  CALCIUM 8.7* 9.1 9.4  PHOS 5.2* 4.7* 5.3*   Liver Function Tests: Recent Labs  Lab 06/22/22 0825 06/23/22 0803 06/24/22 0353 06/25/22 0406 06/26/22 0258  AST 20  --   --   --   --   ALT 33  --   --   --   --   ALKPHOS 99  --   --   --   --   BILITOT 0.8  --   --   --   --   PROT 7.2  --   --   --   --   ALBUMIN 3.2*   < > 2.5* 2.9* 2.7*   < > = values in this interval not displayed.   No results for input(s): "LIPASE", "AMYLASE" in the last 168 hours. CBC: Recent Labs  Lab 06/22/22 0825 06/23/22 0803 06/24/22 0353 06/25/22 0406 06/26/22 0258  WBC 13.8* 9.1 9.3 9.4 10.6*  NEUTROABS 10.7*  --  6.0 5.7 6.2  HGB 8.8* 8.3*  8.3* 8.9* 8.5*  HCT 26.7* 24.9* 25.7* 28.6* 27.0*  MCV 89.3 89.2 90.8 91.7 93.8  PLT 335 360 423* 500* 526*   Blood Culture    Component Value Date/Time   SDES BLOOD RIGHT HAND 06/25/2022 0407   SPECREQUEST  06/25/2022 0407    BOTTLES DRAWN AEROBIC AND ANAEROBIC Blood Culture adequate volume   CULT  06/25/2022 0407    NO GROWTH 1 DAY Performed at [facility], [address], [place], [place] [address]    REPTSTATUS PENDING 06/25/2022 0407    Cardiac Enzymes: No results for input(s): "CKTOTAL", "CKMB", "CKMBINDEX", "TROPONINI" in the last 168 hours. CBG: Recent Labs  Lab 06/25/22 0740 06/25/22 1122 06/25/22 1653 06/25/22 2112 06/26/22 0729  GLUCAP 136* 172* 149* 159* 127*   Iron Studies: No results for input(s): "IRON", "TIBC", "TRANSFERRIN", "FERRITIN" in the last 72 hours. Lab Results  Component Value Date   INR 1.2 06/22/2022   INR 1.1  07/03/2021   INR 1.0 01/24/2020   Studies/Results: No results found.  Medications:  sodium chloride Stopped (06/25/22 0441)    ceFAZolin (ANCEF) IV Stopped (06/25/22 2237)    amLODipine  10 mg Oral QHS   calcitRIOL  0.5 mcg Oral Daily   carvedilol  25 mg Oral BID WC   Chlorhexidine Gluconate Cloth  6 each Topical Q0600   cinacalcet  30 mg Oral Q supper   [START ON 06/29/2022] darbepoetin (ARANESP) injection - DIALYSIS  100 mcg Subcutaneous Q Tue-1800   heparin  5,000 Units Subcutaneous Q8H   insulin aspart  0-6 Units Subcutaneous TID WC   insulin glargine-yfgn  9 Units Subcutaneous Daily   mirtazapine  15 mg Oral QHS   multivitamin  1 tablet Oral QHS   polyethylene glycol  17 g Oral Daily   senna-docusate  1 tablet Oral BID   sucroferric oxyhydroxide  1,500 mg Oral TID WC    Dialysis Orders: HHD  MTTF Dialyzes at [facility] HD,  3 hrs, 4x/week, EDW 63.4Kg. HD Bath 2k, BFR 400. Access: LUE AVG removed on 6/4. Mircera 150 mcg on 5/14 Calcitriol 0.5 mcg daily Sensipar 30 mg daily Velphoro 3 tabs  w/meal  Assessment/Plan: # I. 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 S/p temp cath removal after HD 6/6. Now on line holiday. Plan for new [facility] placement on 6/10. Continue Ancef   # ESRD on HHD 4x/week: Last dialysis about 5 days ago and labs abnormal. HD 06/22/2022 finished around 5 AM today. Will use T,Th,S schedule while in hospital. Received HD 06/24/2022. Now on line holiday. See above. Current BUN 52 and K+ 4.4. She will need to return to in-center HD while using TDC. Renal navigator notified for CLIP and working on finding outpatient HD schedule to accommodate her child care needs.   # Hypertension/volume overload: Resume home medication. Reached OP EDW after last HD Continue to lower EDW as tolerated.    # Anemia of ESRD: HGB 8.5. Aranesp 100 mcg SQ. Follow HGB. No iron D/T infection.    # Secondary hyperparathyroidism/hyperphosphatemia: Resume calcitriol, Sensipar and binders.   # Hypervolemic hyponatremia, hyperkalemia, acidosis all due to incomplete/missing dialysis. Monitor labs while she is on line holiday.  Salome Holmes, NP [facility] 06/26/2022,11:10 AM  LOS: 3 days

## 2022-06-27 DIAGNOSIS — A4101 Sepsis due to Methicillin susceptible Staphylococcus aureus: Secondary | ICD-10-CM | POA: Diagnosis not present

## 2022-06-27 DIAGNOSIS — I120 Hypertensive chronic kidney disease with stage 5 chronic kidney disease or end stage renal disease: Secondary | ICD-10-CM | POA: Diagnosis not present

## 2022-06-27 DIAGNOSIS — T827XXA Infection and inflammatory reaction due to other cardiac and vascular devices, implants and grafts, initial encounter: Secondary | ICD-10-CM | POA: Diagnosis not present

## 2022-06-27 DIAGNOSIS — E1122 Type 2 diabetes mellitus with diabetic chronic kidney disease: Secondary | ICD-10-CM | POA: Diagnosis not present

## 2022-06-27 LAB — RENAL FUNCTION PANEL
Albumin: 2.5 g/dL — ABNORMAL LOW (ref 3.5–5.0)
Anion gap: 16 — ABNORMAL HIGH (ref 5–15)
BUN: 67 mg/dL — ABNORMAL HIGH (ref 6–20)
CO2: 23 mmol/L (ref 22–32)
Calcium: 8.9 mg/dL (ref 8.9–10.3)
Chloride: 88 mmol/L — ABNORMAL LOW (ref 98–111)
Creatinine, Ser: 13.18 mg/dL — ABNORMAL HIGH (ref 0.44–1.00)
GFR, Estimated: 3 mL/min — ABNORMAL LOW (ref >60)
Glucose, Bld: 227 mg/dL — ABNORMAL HIGH (ref 70–99)
Phosphorus: 5.3 mg/dL — ABNORMAL HIGH (ref 2.5–4.6)
Potassium: 4.5 mmol/L (ref 3.5–5.1)
Sodium: 127 mmol/L — ABNORMAL LOW (ref 135–145)

## 2022-06-27 LAB — POCT I-STAT EG7
Acid-base deficit: 4 mmol/L — ABNORMAL HIGH (ref 0.0–2.0)
Bicarbonate: 20.2 mmol/L (ref 20.0–28.0)
Calcium, Ion: 1.09 mmol/L — ABNORMAL LOW (ref 1.15–1.40)
HCT: 22 % — ABNORMAL LOW (ref 36.0–46.0)
Hemoglobin: 7.5 g/dL — ABNORMAL LOW (ref 12.0–15.0)
O2 Saturation: 96 %
Potassium: 4.8 mmol/L (ref 3.5–5.1)
Sodium: 133 mmol/L — ABNORMAL LOW (ref 135–145)
TCO2: 21 mmol/L — ABNORMAL LOW (ref 22–32)
pCO2, Ven: 32.9 mmHg — ABNORMAL LOW (ref 44–60)
pH, Ven: 7.397 (ref 7.25–7.43)
pO2, Ven: 84 mmHg — ABNORMAL HIGH (ref 32–45)

## 2022-06-27 LAB — CBC WITH DIFFERENTIAL/PLATELET
Abs Immature Granulocytes: 0.38 10*3/uL — ABNORMAL HIGH (ref 0.00–0.07)
Basophils Absolute: 0.1 10*3/uL (ref 0.0–0.1)
Basophils Relative: 1 %
Eosinophils Absolute: 0.4 10*3/uL (ref 0.0–0.5)
Eosinophils Relative: 4 %
HCT: 24.1 % — ABNORMAL LOW (ref 36.0–46.0)
Hemoglobin: 7.8 g/dL — ABNORMAL LOW (ref 12.0–15.0)
Immature Granulocytes: 4 %
Lymphocytes Relative: 23 %
Lymphs Abs: 2.3 10*3/uL (ref 0.7–4.0)
MCH: 29.1 pg (ref 26.0–34.0)
MCHC: 32.4 g/dL (ref 30.0–36.0)
MCV: 89.9 fL (ref 80.0–100.0)
Monocytes Absolute: 0.8 10*3/uL (ref 0.1–1.0)
Monocytes Relative: 8 %
Neutro Abs: 5.8 10*3/uL (ref 1.7–7.7)
Neutrophils Relative %: 60 %
Platelets: 573 10*3/uL — ABNORMAL HIGH (ref 150–400)
RBC: 2.68 MIL/uL — ABNORMAL LOW (ref 3.87–5.11)
RDW: 17 % — ABNORMAL HIGH (ref 11.5–15.5)
WBC: 9.7 10*3/uL (ref 4.0–10.5)
nRBC: 0 % (ref 0.0–0.2)

## 2022-06-27 LAB — CULTURE, BLOOD (ROUTINE X 2)
Culture: NO GROWTH
Special Requests: ADEQUATE

## 2022-06-27 LAB — AEROBIC/ANAEROBIC CULTURE W GRAM STAIN (SURGICAL/DEEP WOUND)

## 2022-06-27 LAB — GLUCOSE, CAPILLARY
Glucose-Capillary: 235 mg/dL — ABNORMAL HIGH (ref 70–99)
Glucose-Capillary: 250 mg/dL — ABNORMAL HIGH (ref 70–99)
Glucose-Capillary: 179 mg/dL — ABNORMAL HIGH (ref 70–99)
Glucose-Capillary: 300 mg/dL — ABNORMAL HIGH (ref 70–99)

## 2022-06-27 MED ORDER — ALTEPLASE 2 MG IJ SOLR: 2.0000 mg | Freq: Once | INTRAMUSCULAR | Status: DC | PRN

## 2022-06-27 MED ORDER — LIDOCAINE-PRILOCAINE 2.5-2.5 % EX CREA: 1.0000 | TOPICAL_CREAM | CUTANEOUS | Status: DC | PRN

## 2022-06-27 MED ORDER — HEPARIN SODIUM (PORCINE) 1000 UNIT/ML DIALYSIS
1000.0000 [IU] | INTRAMUSCULAR | Status: DC | PRN
  Administered 2022-06-28: 1000 [IU] via INTRAVENOUS_CENTRAL
  Filled 2022-06-27 (×2): qty 1

## 2022-06-27 MED ORDER — PENTAFLUOROPROP-TETRAFLUOROETH EX AERO: 1.0000 | INHALATION_SPRAY | CUTANEOUS | Status: DC | PRN

## 2022-06-27 MED ORDER — HEPARIN SODIUM (PORCINE) 5000 UNIT/ML IJ SOLN
5000.0000 [IU] | Freq: Three times a day (TID) | INTRAMUSCULAR | Status: AC
  Administered 2022-06-27 (×2): 5000 [IU] via SUBCUTANEOUS
  Filled 2022-06-27: qty 1

## 2022-06-27 MED ORDER — LIDOCAINE HCL (PF) 1 % IJ SOLN: 5.0000 mL | INTRAMUSCULAR | Status: DC | PRN

## 2022-06-27 NOTE — Consult Note (Signed)
Chief Complaint: Patient was seen in consultation today for MSSA bacteremia of L AVG infection at the request of Debe Coder  Referring Physician(s): Debe Coder  Supervising Physician: Gilmer Mor  Patient Status: [facility] - In-pt  History of Present Illness:  Barbara Donovan is a 32 y.o. female presented to [facility] ED 06/22/22 c/o painful, swollen L AVF with last HD 5/30. Pt found to be febrile at 101. Pt had temp cath placed and then removed 06/24/22 for line holiday per ID. Pt has been referred to IR for tunneled hemodialysis catheter placement.   Pt denies fever, chills, CP, SOB, N/V or weakness.  She is aware that she will be NPO at [facility].   Past Medical History:  Diagnosis Date   Asthma    as a child, no problems as an adult, no inhaler   Cataract    NS OU   Chronic hypertension during pregnancy, antepartum 08/19/2017   Dehydration 01/28/2018   Depression during pregnancy, antepartum 07/07/2017   6/20: Short trial of zoloft previously, reports didn't help much but also didn't give it a chance Discussed r/b/a SSRIs in pregnancy, agrees to try Zoloft again, rx sent No SI/HI/red flags   Diabetes (HCC)    TYPE I. 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 A2/B GDM)  Required Referrals for A1GDM or A2GDM: [x]  Diabetes Education and Testing Supplies [x]  Nutrition Cousult  For A2/B  GDM or higher classes of DM [x]  Diabetes Education and Testing Supplies [x]  Nutrition Counsult [x]  Fetal ECHO after 20 weeks  [x]  Eye exam for retina evaluation - severe retinopathy 7/19  Base   Ventricular septal defect (VSD) of fetus in singleton pregnancy, antepartum 09/30/2017   May go to newborn nursery per Dr. Baker Bing Echo prior to discharge    Past Surgical History:  Procedure Laterality Date   25 GAUGE PARS PLANA VITRECTOMY WITH 20 GAUGE MVR PORT FOR MACULAR HOLE Left 07/20/2018   Procedure: 25 GAUGE PARS PLANA VITRECTOMY LEFT EYE;  Surgeon: Rennis Chris, MD;  Location[facility] OR;  Service: Ophthalmology;  Laterality: Left;   AV FISTULA PLACEMENT Left 08/11/2021   Procedure: INSERTION OF LEFT ARM ARTERIOVENOUS (AV) GORE-TEX GRAFT;  Surgeon: Victorino Sparrow, MD;  Location: [facility] OR;  Service: Vascular;  Laterality: Left;  PERIPHERAL NERVE BLOCK   AVGG REMOVAL Left 06/22/2022   Procedure: REMOVAL OF ARTERIOVENOUS GORETEX GRAFT (AVGG) LEFT ARM WITH VEIN PATCH OF BRACHIAL ARTERY;  Surgeon: Chuck Hint, MD;  Location: [facility] OR;  Service: Vascular;  Laterality: Left;   CAPD INSERTION N/A 09/10/2020   Procedure: LAPAROSCOPIC INSERTION CONTINUOUS AMBULATORY PERITONEAL DIALYSIS  (CAPD) CATHETER;  Surgeon: Nada Libman, MD;  Location: [facility] OR;  Service: Vascular;  Laterality: N/A;   CESAREAN SECTION N/A 12/26/2017   Procedure: CESAREAN SECTION;  Surgeon: Tereso Newcomer, MD;  Location: [facility];  Service: Obstetrics;  Laterality: N/A;  EYE EXAMINATION UNDER ANESTHESIA Right 07/20/2018   Procedure: Eye Exam Under Anesthesia RIGHT EYE;  Surgeon: Rennis Chris, MD;  Location: [facility] OR;  Service: Ophthalmology;  Laterality: Right;   EYE SURGERY Left 07/2018   GAS INSERTION Left 07/19/2019   Procedure: INSERTION OF GAS;  Surgeon: Rennis Chris, MD;  Location: [facility] OR;  Service: Ophthalmology;  Laterality: Left;  SF6   INJECTION OF SILICONE OIL Left 07/20/2018   Procedure: Injection Of Silicone Oil LEFT  EYE;  Surgeon: Rennis Chris, MD;  Location: [facility] OR;  Service: Ophthalmology;  Laterality: Left;   INSERTION OF DIALYSIS CATHETER Right 07/05/2021   Procedure: REMOVAL OF DIALYSIS CATHETER;  Surgeon: Leonie Douglas, MD;  Location: [facility] OR;  Service: Vascular;  Laterality: Right;   INSERTION OF DIALYSIS CATHETER Right 07/09/2021   Procedure: INSERTION OF TUNNELED DIALYSIS CATHETER;  Surgeon: Leonie Douglas, MD;  Location: [facility] OR;  Service: Vascular;  Laterality: Right;   INSERTION OF DIALYSIS CATHETER Left 07/09/2021   Procedure: INSERTION OF TUNNELED  DIALYSIS CATHETER;  Surgeon: Leonie Douglas, MD;  Location: [facility] OR;  Service: Vascular;  Laterality: Left;   INSERTION OF DIALYSIS CATHETER N/A 06/22/2022   Procedure: INSERTION OF LEFT INTERNAL JUGULAR TRIALYSIS DIALYSIS CATHETER;  Surgeon: Chuck Hint, MD;  Location: [facility] OR;  Service: Vascular;  Laterality: N/A;   IR FLUORO GUIDE CV LINE RIGHT  06/02/2020   IR US GUIDE VASC ACCESS RIGHT  06/02/2020   LASER PHOTO ABLATION Right 07/20/2018   Procedure: Laser Photo Ablation RIGHT EYE;  Surgeon: Rennis Chris, MD;  Location: [facility] OR;  Service: Ophthalmology;  Laterality: Right;   MEMBRANE PEEL Left 07/20/2018   Procedure: Membrane Peel LEFT EYE;  Surgeon: Rennis Chris, MD;  Location: [facility] OR;  Service: Ophthalmology;  Laterality: Left;   MEMBRANE PEEL Left 07/19/2019   Procedure: MEMBRANE PEEL;  Surgeon: Rennis Chris, MD;  Location: [facility] OR;  Service: Ophthalmology;  Laterality: Left;   MITOMYCIN C APPLICATION Bilateral 07/20/2018   Procedure: Avastin Application;  Surgeon: Rennis Chris, MD;  Location: [facility] OR;  Service: Ophthalmology;  Laterality: Bilateral;   PHOTOCOAGULATION WITH LASER Left 07/20/2018   Procedure: Photocoagulation With Laser LEFT EYE;  Surgeon: Rennis Chris, MD;  Location: [facility] OR;  Service: Ophthalmology;  Laterality: Left;   PHOTOCOAGULATION WITH LASER Left 07/19/2019   Procedure: PHOTOCOAGULATION WITH LASER;  Surgeon: Rennis Chris, MD;   Location: [facility] OR;  Service: Ophthalmology;  Laterality: Left;   RETINAL DETACHMENT SURGERY Left 07/20/2018   Dr. Rennis Chris   SILICON OIL REMOVAL Left 07/19/2019   Procedure: 25g PARS PLANA VITRECTOMY WITH SILICON OIL REMOVAL;  Surgeon: Rennis Chris, MD;  Location: [facility] OR;  Service: Ophthalmology;  Laterality: Left;   TEE WITHOUT CARDIOVERSION N/A 07/08/2021   Procedure: TRANSESOPHAGEAL ECHOCARDIOGRAM (TEE);  Surgeon: Little Ishikawa, MD;  Location: [facility] ENDOSCOPY;  Service: Cardiovascular;  Laterality: N/A;   WISDOM TOOTH EXTRACTION      Allergies: Patient has no known allergies.  Medications: Prior to Admission medications   Medication Sig Start Date End Date Taking? Authorizing Provider  acetaminophen (TYLENOL) 325 MG tablet Take 650 mg by mouth every 6 (six) hours as needed for mild pain.   Yes [provider]  amLODipine (NORVASC) 10 MG tablet Take 1 tablet (10 mg total) by mouth daily. Patient taking differently: Take 10 mg by mouth every evening. 03/09/20  Yes Gonfa, Boyce Medici, MD  metoCLOPramide (REGLAN) 10 MG tablet Take 1 tablet (10 mg total) by mouth every 6 (  six) hours. Patient taking differently: Take 10 mg by mouth every 6 (six) hours as needed for nausea or vomiting. 12/05/21  Yes Edwin Dada P, DO  mirtazapine (REMERON) 15 MG tablet Take 15 mg by mouth at bedtime.   Yes [provider]  sucroferric oxyhydroxide (VELPHORO) 500 MG chewable tablet Chew 1,500 mg by mouth 3 (three) times daily with meals.   Yes [provider]  calcitRIOL (ROCALTROL) 0.5 MCG capsule Take 0.5 mcg by mouth every Monday, Wednesday, and Friday with hemodialysis. 12/02/20   [provider]  carvedilol (COREG) 25 MG tablet Take 1 tablet (25 mg total) by mouth 2 (two) times daily. 03/07/19   Dyann Kief, PA-C  Continuous Blood Gluc Receiver DEVI 1 Units by Does not apply route 4 (four) times daily -  before meals and at bedtime. May substitute for cheapest monitor  with insurance 03/27/21   Ilene Qua, MD  Continuous Blood Gluc Sensor (DEXCOM G7 SENSOR) MISC 1 Device by Does not apply route as directed. 10/13/21   Shamleffer, Konrad Dolores, MD  ethyl chloride spray Apply 1 Application topically every Monday, Wednesday, and Friday with hemodialysis. Apply to arm 05/09/22   [provider]  glucose blood (FREESTYLE TEST STRIPS) test strip Use as instructed 02/25/21   Lorin Glass, MD  hydrALAZINE (APRESOLINE) 50 MG tablet Take 1 tablet by mouth twice a day, may take 1 extra tablet by mouth daily only as needed for elevated blood pressure Patient taking differently: Take 50 mg by mouth 3 (three) times daily. Take 50mg  by mouth three times daily except skip the morning dose on dialysis days 04/28/21   Dyann Kief, PA-C  insulin glargine (LANTUS SOLOSTAR) 100 UNIT/ML Solostar Pen Inject 18 Units into the skin every morning. Patient taking differently: Inject 16 Units into the skin every morning. 02/25/22   Shamleffer, Konrad Dolores, MD  insulin lispro (HUMALOG KWIKPEN) 100 UNIT/ML KwikPen Max daily 35 units Patient taking differently: 5 Units 3 (three) times daily before meals. Max daily 35 units 02/25/22   Shamleffer, Konrad Dolores, MD  Insulin Pen Needle 32G X 4 MM MISC 1 Device by Does not apply route in the morning, at noon, in the evening, and at bedtime. 02/25/22   Shamleffer, Konrad Dolores, MD  multivitamin (RENA-VIT) TABS tablet Take 1 tablet by mouth at bedtime. 03/08/20   Almon Hercules, MD  ondansetron (ZOFRAN-ODT) 8 MG disintegrating tablet Take 1 tablet (8 mg total) by mouth every 8 (eight) hours as needed for nausea or vomiting. 07/31/21   Dione Booze, MD  SENSIPAR 30 MG tablet Take 30 mg by mouth daily. 04/29/22   [provider]     Family History  Problem Relation Age of Onset   Diabetes Mother    Aneurysm Mother 54   Seizures Mother    Diabetes Father    Cataracts Father    COPD Father    Heart attack Father    Heart  disease Father    Healthy Sister    Healthy Daughter    Stroke Maternal Grandfather    Amblyopia Neg Hx    Blindness Neg Hx    Glaucoma Neg Hx    Macular degeneration Neg Hx    Retinal detachment Neg Hx    Strabismus Neg Hx    Retinitis pigmentosa Neg Hx    Colon cancer Neg Hx    Stomach cancer Neg Hx    Esophageal cancer Neg Hx    Pancreatic cancer Neg Hx  Liver disease Neg Hx     Social History   Socioeconomic History   Marital status: Significant Other    Spouse name: Not on file   Number of children: Not on file   Years of education: Not on file   Highest education level: Not on file  Occupational History   Not on file  Tobacco Use   Smoking status: Never   Smokeless tobacco: Never  Vaping Use   Vaping Use: Never used  Substance and Sexual Activity   Alcohol use: Not Currently   Drug use: Never   Sexual activity: Not Currently    Birth control/protection: None  Other Topics Concern   Not on file  Social History Narrative   Not on file   Social Determinants of Health   Financial Resource Strain: Not on file  Food Insecurity: No Food Insecurity (06/22/2022)   Hunger Vital Sign    Worried About Running Out of Food in the Last Year: Never true    Ran Out of Food in the Last Year: Never true  Transportation Needs: No Transportation Needs (06/22/2022)   PRAPARE - Administrator, Civil Service (Medical): No    Lack of Transportation (Non-Medical): No  Physical Activity: Inactive (03/22/2019)   Exercise Vital Sign    Days of Exercise per Week: 0 days    Minutes of Exercise per Session: 0 min  Stress: No Stress Concern Present (03/22/2019)   [employer] of Occupational Health - Occupational Stress Questionnaire    Feeling of Stress : Not at all  Social Connections: Not on file     Review of Systems: A 12 point ROS discussed and pertinent positives are indicated in the HPI above.  All other systems are negative.  Review of Systems  All other  systems reviewed and are negative.   Vital Signs: BP (!) 148/82   Pulse 75   Temp 98.6 F (37 C)   Resp 18   Ht 5\' 6"  (1.676 m)   Wt 132 lb 11.5 oz (60.2 kg)   SpO2 98%   BMI 21.42 kg/m     Physical Exam Vitals reviewed.  Constitutional:      General: She is not in acute distress.    Appearance: Normal appearance. She is not ill-appearing.  HENT:     Head: Normocephalic and atraumatic.     Mouth/Throat:     Mouth: Mucous membranes are moist.     Pharynx: Oropharynx is clear.  Eyes:     Extraocular Movements: Extraocular movements intact.     Pupils: Pupils are equal, round, and reactive to light.  Cardiovascular:     Rate and Rhythm: Normal rate and regular rhythm.     Pulses: Normal pulses.     Heart sounds: Normal heart sounds.  Pulmonary:     Effort: Pulmonary effort is normal.     Breath sounds: Normal breath sounds.  Abdominal:     General: Bowel sounds are normal. There is no distension.     Palpations: Abdomen is soft.     Tenderness: There is no abdominal tenderness. There is no guarding.  Skin:    General: Skin is warm and dry.     Comments: L AVG covered with compression wrap  Neurological:     Mental Status: She is alert and oriented to person, place, and time.  Psychiatric:        Mood and Affect: Mood normal.        Behavior: Behavior normal.  Thought Content: Thought content normal.        Judgment: Judgment normal.     Imaging: DG Chest 2 View  Result Date: 06/26/2022 CLINICAL DATA:  Cough, fever EXAM: CHEST - 2 VIEW COMPARISON:  06/22/2022 FINDINGS: The heart size and mediastinal contours are within normal limits. Both lungs are clear. The visualized skeletal structures are unremarkable. IMPRESSION: No active cardiopulmonary disease. Electronically Signed   By: Charlett Nose M.D.   On: 06/26/2022 14:19   ECHOCARDIOGRAM COMPLETE  Result Date: 06/23/2022    ECHOCARDIOGRAM REPORT   Patient Name:   ARLEY PORATH Date of Exam: 06/23/2022  Medical Rec #:  [id number]        Height:       66.0 in Accession #:    [id number]       Weight:       145.0 lb Date of Birth:  06/06/1990        BSA:          1.744 m Patient Age:    31 years         BP:           143/79 mmHg Patient Gender: F                HR:           86 bpm. Exam Location:  Inpatient Procedure: 2D Echo, Cardiac Doppler and Color Doppler Indications:    Bacteremia  History:        Patient has prior history of Echocardiogram examinations, most                 recent 07/05/2021. Risk Factors:Diabetes and Hypertension.  Sonographer:    Darlys Gales Referring Phys: Lisette Grinder [facility] [place] IMPRESSIONS  1. Left ventricular ejection fraction, by estimation, is 55 to 60%. The left ventricle has normal function. The left ventricle has no regional wall motion abnormalities. There is mild concentric left ventricular hypertrophy. Left ventricular diastolic parameters were normal.  2. Right ventricular systolic function is normal. The right ventricular size is normal.  3. The mitral valve is normal in structure. Trivial mitral valve regurgitation. No evidence of mitral stenosis.  4. The aortic valve is normal in structure. Aortic valve regurgitation is not visualized. No aortic stenosis is present.  5. The inferior vena cava is normal in size with greater than 50% respiratory variability, suggesting right atrial pressure of 3 mmHg. Conclusion(s)/Recommendation(s): No evidence of valvular vegetations on this transthoracic echocardiogram. Consider a transesophageal echocardiogram to exclude infective endocarditis if clinically indicated. FINDINGS  Left Ventricle: Left ventricular ejection fraction, by estimation, is 55 to 60%. The left ventricle has normal function. The left ventricle has no regional wall motion abnormalities. The left ventricular internal cavity size was normal in size. There is  mild concentric left ventricular hypertrophy. Left ventricular diastolic parameters were normal. Normal left  ventricular filling pressure. Right Ventricle: The right ventricular size is normal. No increase in right ventricular wall thickness. Right ventricular systolic function is normal. Left Atrium: Left atrial size was normal in size. Right Atrium: Right atrial size was normal in size. Pericardium: Trivial pericardial effusion is present. The pericardial effusion is posterior to the left ventricle. Mitral Valve: The mitral valve is normal in structure. Trivial mitral valve regurgitation. No evidence of mitral valve stenosis. Tricuspid Valve: The tricuspid valve is normal in structure. Tricuspid valve regurgitation is trivial. No evidence of tricuspid stenosis. Aortic Valve: The aortic valve is normal in structure. Aortic  valve regurgitation is not visualized. No aortic stenosis is present. Aortic valve mean gradient measures 6.0 mmHg. Aortic valve peak gradient measures 11.3 mmHg. Pulmonic Valve: The pulmonic valve was normal in structure. Pulmonic valve regurgitation is trivial. No evidence of pulmonic stenosis. Aorta: The aortic root is normal in size and structure. Venous: The inferior vena cava is normal in size with greater than 50% respiratory variability, suggesting right atrial pressure of 3 mmHg. IAS/Shunts: No atrial level shunt detected by color flow Doppler.  LEFT VENTRICLE PLAX 2D LVIDd:         4.90 cm Diastology LVIDs:         3.50 cm LV e' medial:    8.81 cm/s LV PW:         1.30 cm LV E/e' medial:  9.0 LV IVS:        1.20 cm LV e' lateral:   11.50 cm/s                        LV E/e' lateral: 6.9  RIGHT VENTRICLE RV S prime:     14.10 cm/s TAPSE (M-mode): 2.4 cm LEFT ATRIUM             Index        RIGHT ATRIUM           Index LA Vol (A2C):   44.8 ml 25.68 ml/m  RA Area:     14.20 cm LA Vol (A4C):   42.9 ml 24.59 ml/m  RA Volume:   30.90 ml  17.71 ml/m LA Biplane Vol: 45.0 ml 25.80 ml/m  AORTIC VALVE AV Vmax:           168.00 cm/s AV Vmean:          113.000 cm/s AV VTI:            0.285 m AV Peak  Grad:      11.3 mmHg AV Mean Grad:      6.0 mmHg LVOT Vmax:         154.00 cm/s LVOT Vmean:        108.000 cm/s LVOT VTI:          0.282 m LVOT/AV VTI ratio: 0.99  AORTA Ao Root diam: 2.50 cm MITRAL VALVE MV Area (PHT): 4.08 cm    SHUNTS MV Decel Time: 186 msec    Systemic VTI: 0.28 m MV E velocity: 79.70 cm/s MV A velocity: 59.20 cm/s MV E/A ratio:  1.35 Armanda Magic MD Electronically signed by Armanda Magic MD Signature Date/Time: 06/23/2022/10:51:38 AM    Final    DG Chest Port 1 View  Result Date: 06/22/2022 CLINICAL DATA:  Status post insertion of dialysis catheter. EXAM: PORTABLE CHEST 1 VIEW COMPARISON:  Chest radiograph earlier today FINDINGS: Dual lumen left internal jugular dialysis catheter tip is in the right atrium. No pneumothorax. Overall low lung volumes. Stable cardiomegaly. Mediastinal contours are unchanged. The faint right upper lobe opacity is not well delineated on the current exam, with no definite nodular component. There may be small bilateral pleural effusions. IMPRESSION: 1. Tip of the dual lumen left internal jugular dialysis catheter is in the right atrium. No pneumothorax. 2. Stable cardiomegaly. 3. The previous right upper lobe nodular density is faintly visualized on the current exam. Electronically Signed   By: Narda Rutherford M.D.   On: 06/22/2022 16:50   DG C-Arm 1-60 Min-No Report  Result Date: 06/22/2022 Fluoroscopy was utilized by the requesting physician.  No radiographic interpretation.   DG Chest 2 View  Result Date: 06/22/2022 CLINICAL DATA:  Rule out sepsis. EXAM: CHEST - 2 VIEW COMPARISON:  07/02/2021. FINDINGS: New nodular opacity in the right upper lobe. No consolidation or pulmonary edema. Stable cardiac and mediastinal contours. No pleural effusion or pneumothorax. Visualized bones and upper abdomen are unremarkable. IMPRESSION: New nodular opacity in the right upper lobe, which may be infectious, inflammatory or neoplastic in etiology. Recommend follow-up  chest radiograph in 4-6 weeks to ensure resolution. Electronically Signed   By: Orvan Falconer M.D.   On: 06/22/2022 08:13    Labs:  CBC: Recent Labs    06/24/22 0353 06/25/22 0406 06/26/22 0258 06/27/22 0154  WBC 9.3 9.4 10.6* 9.7  HGB 8.3* 8.9* 8.5* 7.8*  HCT 25.7* 28.6* 27.0* 24.1*  PLT 423* 500* 526* 573*    COAGS: Recent Labs    07/03/21 0753 06/22/22 0825  INR 1.1 1.2  APTT 39*  --     BMP: Recent Labs    06/24/22 0353 06/25/22 0406 06/26/22 0258 06/27/22 0154  NA 130* 132* 130* 127*  K 4.5 4.5 4.4 4.5  CL 90* 89* 88* 88*  CO2 23 25 24 23   GLUCOSE 146* 163* 148* 227*  BUN 72* 32* 52* 67*  CALCIUM 8.7* 9.1 9.4 8.9  CREATININE 12.31* 7.92* 10.81* 13.18*  GFRNONAA 4* 6* 4* 3*    LIVER FUNCTION TESTS: Recent Labs    07/28/21 1703 07/31/21 0238 12/05/21 1956 06/22/22 0825 06/23/22 0803 06/24/22 0353 06/25/22 0406 06/26/22 0258 06/27/22 0154  BILITOT 0.9 0.7 0.6 0.8  --   --   --   --   --   AST 18 28 21 20   --   --   --   --   --   ALT 8 7 18  33  --   --   --   --   --   ALKPHOS 72 64 79 99  --   --   --   --   --   PROT 7.9 8.3* 6.5 7.2  --   --   --   --   --   ALBUMIN 3.2* 3.8 3.3* 3.2*   < > 2.5* 2.9* 2.7* 2.5*   < > = values in this interval not displayed.    TUMOR MARKERS: No results for input(s): "AFPTM", "CEA", "CA199", "CHROMGRNA" in the last 8760 hours.  Assessment and Plan:  32 yo female with PMHx significant for ESRD on HD MTTF and L AVG infection presents to IR for tunneled dialysis catheter placement.   Pt sitting upright in bed with mother at bedside.  She is A&O, calm and pleasant.  She is in no distress.   Pt is a FULL CODE  Risks and benefits discussed with the patient including, but not limited to bleeding, infection, vascular injury, pneumothorax which may require chest tube placement, air embolism or even death  All of the patient's questions were answered, patient is agreeable to proceed. Consent signed and in  chart.  Thank you for this interesting consult.  I greatly enjoyed meeting TECLA MCCAGHREN and look forward to participating in their care.  A copy of this report was sent to the requesting provider on this date.  Electronically Signed: Shon Hough, NP 06/27/2022, 8:59 AM   I spent a total of 20 minutes in face to face in clinical consultation, greater than 50% of which was counseling/coordinating care for L AVG infection.

## 2022-06-27 NOTE — Anesthesia Preprocedure Evaluation (Signed)
Anesthesia Evaluation  Patient identified by MRN, date of birth, ID band Patient awake    Reviewed: Allergy & Precautions, NPO status , Patient's Chart, lab work & pertinent test results  History of Anesthesia Complications Negative for: history of anesthetic complications  Airway Mallampati: III  TM Distance: >3 FB Neck ROM: Full   Comment: Previous grade I view with MAC 3, easy mask Dental  (+) Dental Advisory Given   Pulmonary neg shortness of breath, asthma , neg sleep apnea, neg COPD, neg recent URI   Pulmonary exam normal breath sounds clear to auscultation       Cardiovascular hypertension, Pt. on medications and Pt. on home beta blockers (-) angina (-) Past MI, (-) Cardiac Stents and (-) CABG (-) dysrhythmias  Rhythm:Regular Rate:Normal  HLD  TTE 06/23/2022: IMPRESSIONS     1. Left ventricular ejection fraction, by estimation, is 55 to 60%. The  left ventricle has normal function. The left ventricle has no regional  wall motion abnormalities. There is mild concentric left ventricular  hypertrophy. Left ventricular diastolic  parameters were normal.   2. Right ventricular systolic function is normal. The right ventricular  size is normal.   3. The mitral valve is normal in structure. Trivial mitral valve  regurgitation. No evidence of mitral stenosis.   4. The aortic valve is normal in structure. Aortic valve regurgitation is  not visualized. No aortic stenosis is present.   5. The inferior vena cava is normal in size with greater than 50%  respiratory variability, suggesting right atrial pressure of 3 mmHg.     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 S/p temp cath removal after HD 6/6. Repeat blood cultures 6/7 NGTD. Now on line holiday. Plan for new [facility] placement on 6/10. Plan for TEE on 6/11.   Hypervolemic hyponatremia, hyperkalemia, acidosis all due to incomplete/missing dialysis  Reproductive/Obstetrics                              Anesthesia Physical Anesthesia Plan  ASA: 4  Anesthesia Plan: General   Post-op Pain Management:    Induction: Intravenous  PONV Risk Score and Plan: 3 and Ondansetron, Dexamethasone and Treatment may vary due to age or medical condition  Airway Management Planned: LMA  Additional Equipment:   Intra-op Plan:   Post-operative Plan: Extubation in OR  Informed Consent: I have reviewed the patients History and Physical, chart, labs and discussed the procedure including the risks, benefits and alternatives for the proposed anesthesia with the patient or authorized representative who has indicated his/her understanding and acceptance.     Dental advisory given  Plan Discussed with: CRNA and Anesthesiologist  Anesthesia Plan Comments: (Risks of general anesthesia discussed including, but not limited to, sore throat, hoarse voice, chipped/damaged teeth, injury to vocal cords, nausea and vomiting, allergic reactions, lung infection, heart attack, stroke, and death. All questions answered. )        Anesthesia Quick Evaluation

## 2022-06-27 NOTE — Progress Notes (Signed)
Please make NPO midnight is preparation for [facility] placement   Victorino Sparrow MD

## 2022-06-27 NOTE — Progress Notes (Signed)
     Subjective: Doing well. She notes pain in the site of her surgery on the LUE.  Otherwise, she has no pain, nausea, confusion, vomiting.  She is eating well, no swelling.   Objective:  Vital signs in last 24 hours: Vitals:   06/26/22 0920 06/26/22 1619 06/26/22 2043 06/27/22 0451  BP: 127/71 (!) 143/92 (!) 153/90 (!) 144/77  Pulse: 78 72 73 74  Resp: 19 19 20 20   Temp: 98.6 F (37 C) 98.2 F (36.8 C) 98.1 F (36.7 C) 98 F (36.7 C)  TempSrc: Oral Oral Oral Oral  SpO2: 100% 100% 98% 100%  Weight:      Height:       Gen: Lying in bed, no distress Eyes: Anicteric sclerae HENT: neck is supple CV: RR, NR, no murmur, no peripheral edema Pulm: breathing comfortably on room air, no wheezing Abd: Soft, NT, ND, +BS MSK: normal tone and bulk.  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   Inez Catalina, MD 06/27/2022, 7:35 AM

## 2022-06-27 NOTE — Progress Notes (Signed)
[facility] Progress Note   Subjective:    Seen and examined patient at bedside. Reports feeling okay. Denies SOB, CP, and N/V. K+ 4.5 and BUN 67. Plan for new [facility] placement tomorrow (6/10) then HD afterwards.  Objective Vitals:   06/26/22 1619 06/26/22 2043 06/27/22 0451 06/27/22 0856  BP: (!) 143/92 (!) 153/90 (!) 144/77 (!) 148/82  Pulse: 72 73 74 75  Resp: 19 20 20 18   Temp: 98.2 F (36.8 C) 98.1 F (36.7 C) 98 F (36.7 C) 98.6 F (37 C)  TempSrc: Oral Oral Oral   SpO2: 100% 98% 100% 98%  Weight:      Height:       Physical Exam General: Chronically ill appearing female in NAD Heart: S1,S2 RRR No M/R/G Lungs: CTAB A/P. No WOB.  Abdomen: NABS, NT Extremities: No LE edema Dialysis Access: LIJ Temp catheter removed 6/6-now on line holiday  Filed Weights   06/22/22 0945 06/24/22 0913 06/24/22 1354  Weight: 65.8 kg 63.2 kg 60.2 kg    Intake/Output Summary (Last 24 hours) at 06/27/2022 0946 Last data filed at 06/27/2022 0600 Gross per 24 hour  Intake 485.71 ml  Output 0 ml  Net 485.71 ml    Additional Objective Labs: Basic Metabolic Panel: Recent Labs  Lab 06/25/22 0406 06/26/22 0258 06/27/22 0154  NA 132* 130* 127*  K 4.5 4.4 4.5  CL 89* 88* 88*  CO2 25 24 23   GLUCOSE 163* 148* 227*  BUN 32* 52* 67*  CREATININE 7.92* 10.81* 13.18*  CALCIUM 9.1 9.4 8.9  PHOS 4.7* 5.3* 5.3*   Liver Function Tests: Recent Labs  Lab 06/22/22 0825 06/23/22 0803 06/25/22 0406 06/26/22 0258 06/27/22 0154  AST 20  --   --   --   --   ALT 33  --   --   --   --   ALKPHOS 99  --   --   --   --   BILITOT 0.8  --   --   --   --   PROT 7.2  --   --   --   --   ALBUMIN 3.2*   < > 2.9* 2.7* 2.5*   < > = values in this interval not displayed.   No results for input(s): "LIPASE", "AMYLASE" in the last 168 hours. CBC: Recent Labs  Lab 06/23/22 0803 06/24/22 0353 06/25/22 0406 06/26/22 0258 06/27/22 0154  WBC 9.1 9.3 9.4 10.6* 9.7  NEUTROABS  --  6.0 5.7  6.2 5.8  HGB 8.3* 8.3* 8.9* 8.5* 7.8*  HCT 24.9* 25.7* 28.6* 27.0* 24.1*  MCV 89.2 90.8 91.7 93.8 89.9  PLT 360 423* 500* 526* 573*   Blood Culture    Component Value Date/Time   SDES BLOOD RIGHT HAND 06/25/2022 0407   SPECREQUEST  06/25/2022 0407    BOTTLES DRAWN AEROBIC AND ANAEROBIC Blood Culture adequate volume   CULT  06/25/2022 0407    NO GROWTH 2 DAYS Performed at [facility], [address] [place], [place] [address]    REPTSTATUS PENDING 06/25/2022 0407    Cardiac Enzymes: No results for input(s): "CKTOTAL", "CKMB", "CKMBINDEX", "TROPONINI" in the last 168 hours. CBG: Recent Labs  Lab 06/26/22 0729 06/26/22 1114 06/26/22 1619 06/26/22 2125 06/27/22 0720  GLUCAP 127* 243* 163* 198* 235*   Iron Studies: No results for input(s): "IRON", "TIBC", "TRANSFERRIN", "FERRITIN" in the last 72 hours. Lab Results  Component Value Date   INR 1.2 06/22/2022   INR  1.1 07/03/2021   INR 1.0 01/24/2020   Studies/Results: DG Chest 2 View  Result Date: 06/26/2022 CLINICAL DATA:  Cough, fever EXAM: CHEST - 2 VIEW COMPARISON:  06/22/2022 FINDINGS: The heart size and mediastinal contours are within normal limits. Both lungs are clear. The visualized skeletal structures are unremarkable. IMPRESSION: No active cardiopulmonary disease. Electronically Signed   By: Charlett Nose M.D.   On: 06/26/2022 14:19    Medications:  sodium chloride Stopped (06/26/22 2226)    ceFAZolin (ANCEF) IV Stopped (06/26/22 2227)    amLODipine  10 mg Oral QHS   calcitRIOL  0.5 mcg Oral Daily   carvedilol  25 mg Oral BID WC   Chlorhexidine Gluconate Cloth  6 each Topical Q0600   cinacalcet  30 mg Oral Q supper   [START ON 06/29/2022] darbepoetin (ARANESP) injection - DIALYSIS  100 mcg Subcutaneous Q Tue-1800   heparin  5,000 Units Subcutaneous Q8H   insulin aspart  0-6 Units Subcutaneous TID WC   insulin glargine-yfgn  9 Units Subcutaneous Daily   mirtazapine  15 mg Oral QHS   multivitamin  1  tablet Oral QHS   polyethylene glycol  17 g Oral Daily   senna-docusate  1 tablet Oral BID   sucroferric oxyhydroxide  1,500 mg Oral TID WC    Dialysis Orders: HHD  MTTF Dialyzes at [facility] HD,  3 hrs, 4x/week, EDW 63.4Kg. HD Bath 2k, BFR 400. Access: LUE AVG removed on 6/4. Mircera 150 mcg on 5/14 Calcitriol 0.5 mcg daily Sensipar 30 mg daily Velphoro 3 tabs w/meal   Assessment/Plan: # I. 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 S/p temp cath removal after HD 6/6. Repeat blood cultures 6/7 NGTD. Now on line holiday. Plan for new [facility] placement on 6/10. Plan for TEE on 6/11. Continue Ancef   # ESRD on HHD 4x/week: Last dialysis about 5 days ago and labs abnormal. HD 06/22/2022 finished around 5 AM today. Will use T,Th,S schedule while in hospital. Received HD 06/24/2022. Now on line holiday. Next HD 6/10 after TDC placement. See above. Current BUN 67 and K+ 4.5. She will need to return to in-center HD while using [facility]. Renal navigator notified for CLIP and working on finding outpatient HD schedule to accommodate her child care needs.   # Hypertension/volume overload: Continue home medications. Reached OP EDW after last HD Continue to lower EDW as tolerated.    # Anemia of ESRD: HGB trending down, now 7.8. Will raise Aranesp to 150 mcg SQ. Follow HGB. No iron D/T infection.    # Secondary hyperparathyroidism/hyperphosphatemia: Continue calcitriol, Sensipar and binders.   # Hypervolemic hyponatremia, hyperkalemia, acidosis all due to incomplete/missing dialysis. Monitor labs while she is on line holiday.  Salome Holmes, NP [facility] 06/27/2022,9:46 AM  LOS: 4 days

## 2022-06-28 ENCOUNTER — Inpatient Hospital Stay (HOSPITAL_COMMUNITY): Payer: 59 | Admitting: Anesthesiology

## 2022-06-28 ENCOUNTER — Inpatient Hospital Stay (HOSPITAL_COMMUNITY): Payer: 59

## 2022-06-28 ENCOUNTER — Encounter (HOSPITAL_COMMUNITY)
Admission: EM | Disposition: A | Discharge status: Home Health | Payer: Self-pay | Source: Home / Self Care | Attending: Internal Medicine

## 2022-06-28 ENCOUNTER — Other Ambulatory Visit: Payer: Self-pay

## 2022-06-28 DIAGNOSIS — J45909 Unspecified asthma, uncomplicated: Secondary | ICD-10-CM

## 2022-06-28 DIAGNOSIS — R7881 Bacteremia: Secondary | ICD-10-CM | POA: Diagnosis not present

## 2022-06-28 DIAGNOSIS — I120 Hypertensive chronic kidney disease with stage 5 chronic kidney disease or end stage renal disease: Secondary | ICD-10-CM | POA: Diagnosis not present

## 2022-06-28 DIAGNOSIS — T827XXA Infection and inflammatory reaction due to other cardiac and vascular devices, implants and grafts, initial encounter: Secondary | ICD-10-CM | POA: Diagnosis not present

## 2022-06-28 DIAGNOSIS — E1022 Type 1 diabetes mellitus with diabetic chronic kidney disease: Secondary | ICD-10-CM | POA: Diagnosis not present

## 2022-06-28 DIAGNOSIS — B9561 Methicillin susceptible Staphylococcus aureus infection as the cause of diseases classified elsewhere: Secondary | ICD-10-CM | POA: Diagnosis not present

## 2022-06-28 DIAGNOSIS — N186 End stage renal disease: Secondary | ICD-10-CM | POA: Diagnosis not present

## 2022-06-28 DIAGNOSIS — R918 Other nonspecific abnormal finding of lung field: Secondary | ICD-10-CM

## 2022-06-28 DIAGNOSIS — Z992 Dependence on renal dialysis: Secondary | ICD-10-CM

## 2022-06-28 DIAGNOSIS — D631 Anemia in chronic kidney disease: Secondary | ICD-10-CM

## 2022-06-28 DIAGNOSIS — D638 Anemia in other chronic diseases classified elsewhere: Secondary | ICD-10-CM

## 2022-06-28 HISTORY — PX: INSERTION OF DIALYSIS CATHETER: SHX1324

## 2022-06-28 LAB — CULTURE, BLOOD (ROUTINE X 2)

## 2022-06-28 LAB — CBC WITH DIFFERENTIAL/PLATELET
Abs Immature Granulocytes: 0.24 10*3/uL — ABNORMAL HIGH (ref 0.00–0.07)
Basophils Absolute: 0.1 10*3/uL (ref 0.0–0.1)
Basophils Relative: 1 %
Eosinophils Absolute: 0.3 10*3/uL (ref 0.0–0.5)
Eosinophils Relative: 4 %
HCT: 23.7 % — ABNORMAL LOW (ref 36.0–46.0)
Hemoglobin: 7.8 g/dL — ABNORMAL LOW (ref 12.0–15.0)
Immature Granulocytes: 3 %
Lymphocytes Relative: 22 %
Lymphs Abs: 2.1 10*3/uL (ref 0.7–4.0)
MCH: 30 pg (ref 26.0–34.0)
MCHC: 32.9 g/dL (ref 30.0–36.0)
MCV: 91.2 fL (ref 80.0–100.0)
Monocytes Absolute: 0.7 10*3/uL (ref 0.1–1.0)
Monocytes Relative: 7 %
Neutro Abs: 5.8 10*3/uL (ref 1.7–7.7)
Neutrophils Relative %: 63 %
Platelets: 552 10*3/uL — ABNORMAL HIGH (ref 150–400)
RBC: 2.6 MIL/uL — ABNORMAL LOW (ref 3.87–5.11)
RDW: 16.9 % — ABNORMAL HIGH (ref 11.5–15.5)
WBC: 9.2 10*3/uL (ref 4.0–10.5)
nRBC: 0 % (ref 0.0–0.2)

## 2022-06-28 LAB — RENAL FUNCTION PANEL
Albumin: 2.6 g/dL — ABNORMAL LOW (ref 3.5–5.0)
Anion gap: 18 — ABNORMAL HIGH (ref 5–15)
BUN: 91 mg/dL — ABNORMAL HIGH (ref 6–20)
CO2: 23 mmol/L (ref 22–32)
Calcium: 9 mg/dL (ref 8.9–10.3)
Chloride: 87 mmol/L — ABNORMAL LOW (ref 98–111)
Creatinine, Ser: 15.73 mg/dL — ABNORMAL HIGH (ref 0.44–1.00)
GFR, Estimated: 3 mL/min — ABNORMAL LOW (ref >60)
Glucose, Bld: 216 mg/dL — ABNORMAL HIGH (ref 70–99)
Phosphorus: 6.1 mg/dL — ABNORMAL HIGH (ref 2.5–4.6)
Potassium: 4.8 mmol/L (ref 3.5–5.1)
Sodium: 128 mmol/L — ABNORMAL LOW (ref 135–145)

## 2022-06-28 LAB — GLUCOSE, CAPILLARY
Glucose-Capillary: 167 mg/dL — ABNORMAL HIGH (ref 70–99)
Glucose-Capillary: 169 mg/dL — ABNORMAL HIGH (ref 70–99)
Glucose-Capillary: 144 mg/dL — ABNORMAL HIGH (ref 70–99)
Glucose-Capillary: 216 mg/dL — ABNORMAL HIGH (ref 70–99)

## 2022-06-28 SURGERY — INSERTION OF DIALYSIS CATHETER
Anesthesia: General | Site: Chest | Laterality: Left

## 2022-06-28 MED ORDER — 0.9 % SODIUM CHLORIDE (POUR BTL) OPTIME
TOPICAL | Status: DC | PRN
  Administered 2022-06-28: 1000 mL

## 2022-06-28 MED ORDER — PROMETHAZINE HCL 25 MG/ML IJ SOLN: 6.2500 mg | INTRAMUSCULAR | Status: DC | PRN

## 2022-06-28 MED ORDER — HYDROMORPHONE HCL 2 MG PO TABS
2.0000 mg | ORAL_TABLET | ORAL | Status: DC | PRN
  Administered 2022-06-28: 2 mg via ORAL
  Filled 2022-06-28 (×2): qty 1

## 2022-06-28 MED ORDER — HEPARIN 6000 UNIT IRRIGATION SOLUTION
Status: AC
  Filled 2022-06-28: qty 500

## 2022-06-28 MED ORDER — HEPARIN SODIUM (PORCINE) 1000 UNIT/ML IJ SOLN
INTRAMUSCULAR | Status: AC
  Filled 2022-06-28: qty 10

## 2022-06-28 MED ORDER — MIDAZOLAM HCL 2 MG/2ML IJ SOLN
INTRAMUSCULAR | Status: DC | PRN
  Administered 2022-06-28: 2 mg via INTRAVENOUS

## 2022-06-28 MED ORDER — LIDOCAINE 2% (20 MG/ML) 5 ML SYRINGE
INTRAMUSCULAR | Status: DC | PRN
  Administered 2022-06-28: 60 mg via INTRAVENOUS

## 2022-06-28 MED ORDER — HYDROMORPHONE HCL 1 MG/ML IJ SOLN
0.5000 mg | INTRAMUSCULAR | Status: DC | PRN
  Administered 2022-06-28 – 2022-06-29 (×3): 0.5 mg via INTRAVENOUS
  Filled 2022-06-28 (×3): qty 1

## 2022-06-28 MED ORDER — MIDAZOLAM HCL 2 MG/2ML IJ SOLN
INTRAMUSCULAR | Status: AC
  Filled 2022-06-28: qty 2

## 2022-06-28 MED ORDER — PROPOFOL 10 MG/ML IV BOLUS
INTRAVENOUS | Status: AC
  Filled 2022-06-28: qty 20

## 2022-06-28 MED ORDER — LIDOCAINE HCL 1 % IJ SOLN
INTRAMUSCULAR | Status: AC
  Filled 2022-06-28: qty 20

## 2022-06-28 MED ORDER — OXYCODONE HCL 5 MG/5ML PO SOLN: 5.0000 mg | Freq: Once | ORAL | Status: AC | PRN

## 2022-06-28 MED ORDER — PROPOFOL 10 MG/ML IV BOLUS
INTRAVENOUS | Status: DC | PRN
  Administered 2022-06-28: 160 mg via INTRAVENOUS

## 2022-06-28 MED ORDER — FENTANYL CITRATE (PF) 250 MCG/5ML IJ SOLN
INTRAMUSCULAR | Status: AC
  Filled 2022-06-28: qty 5

## 2022-06-28 MED ORDER — FENTANYL CITRATE (PF) 250 MCG/5ML IJ SOLN
INTRAMUSCULAR | Status: DC | PRN
  Administered 2022-06-28 (×2): 50 ug via INTRAVENOUS

## 2022-06-28 MED ORDER — ACETAMINOPHEN 500 MG PO TABS
1000.0000 mg | ORAL_TABLET | Freq: Three times a day (TID) | ORAL | Status: DC
  Administered 2022-06-29: 1000 mg via ORAL
  Filled 2022-06-28 (×3): qty 2

## 2022-06-28 MED ORDER — ACETAMINOPHEN 10 MG/ML IV SOLN: 1000.0000 mg | Freq: Once | INTRAVENOUS | Status: DC | PRN

## 2022-06-28 MED ORDER — HEPARIN 6000 UNIT IRRIGATION SOLUTION
Status: DC | PRN
  Administered 2022-06-28: 1

## 2022-06-28 MED ORDER — HEPARIN SODIUM (PORCINE) 1000 UNIT/ML IJ SOLN
INTRAMUSCULAR | Status: DC | PRN
  Administered 2022-06-28: 2800 [IU] via INTRAVENOUS

## 2022-06-28 MED ORDER — OXYCODONE HCL 5 MG PO TABS
ORAL_TABLET | ORAL | Status: AC
  Filled 2022-06-28: qty 1

## 2022-06-28 MED ORDER — LIDOCAINE-EPINEPHRINE 1 %-1:100000 IJ SOLN
INTRAMUSCULAR | Status: AC
  Filled 2022-06-28: qty 1

## 2022-06-28 MED ORDER — OXYCODONE HCL 5 MG PO TABS
5.0000 mg | ORAL_TABLET | Freq: Once | ORAL | Status: AC | PRN
  Administered 2022-06-28: 5 mg via ORAL

## 2022-06-28 MED ORDER — FENTANYL CITRATE (PF) 100 MCG/2ML IJ SOLN: 25.0000 ug | INTRAMUSCULAR | Status: DC | PRN

## 2022-06-28 MED ORDER — IODIXANOL 320 MG/ML IV SOLN
INTRAVENOUS | Status: DC | PRN
  Administered 2022-06-28: 6 mL via INTRAVENOUS

## 2022-06-28 MED ORDER — DARBEPOETIN ALFA 150 MCG/0.3ML IJ SOSY: 150.0000 ug | PREFILLED_SYRINGE | INTRAMUSCULAR | Status: DC

## 2022-06-28 MED ORDER — ONDANSETRON HCL 4 MG/2ML IJ SOLN
INTRAMUSCULAR | Status: DC | PRN
  Administered 2022-06-28: 4 mg via INTRAVENOUS

## 2022-06-28 SURGICAL SUPPLY — 43 items
APL SKNCLS STERI-STRIP NONHPOA (GAUZE/BANDAGES/DRESSINGS) ×1
BAG DECANTER FOR FLEXI CONT (MISCELLANEOUS) ×1 IMPLANT
BENZOIN TINCTURE PRP APPL 2/3 (GAUZE/BANDAGES/DRESSINGS) ×1 IMPLANT
BIOPATCH RED 1 DISK 7.0 (GAUZE/BANDAGES/DRESSINGS) ×1 IMPLANT
BNDG CMPR 5X4 KNIT ELC UNQ LF (GAUZE/BANDAGES/DRESSINGS) ×2
BNDG ELASTIC 4INX 5YD STR LF (GAUZE/BANDAGES/DRESSINGS) IMPLANT
BNDG GAUZE DERMACEA FLUFF 4 (GAUZE/BANDAGES/DRESSINGS) IMPLANT
BNDG GZE DERMACEA 4 6PLY (GAUZE/BANDAGES/DRESSINGS) ×1
CATH PALINDROME-P 23CM W/VT (CATHETERS) IMPLANT
COVER PROBE W GEL 5X96 (DRAPES) ×1 IMPLANT
COVER SURGICAL LIGHT HANDLE (MISCELLANEOUS) ×1 IMPLANT
DRAPE CHEST BREAST 15X10 FENES (DRAPES) ×1 IMPLANT
GAUZE 4X4 16PLY ~~LOC~~+RFID DBL (SPONGE) ×1 IMPLANT
GAUZE PAD ABD 8X10 STRL (GAUZE/BANDAGES/DRESSINGS) IMPLANT
GAUZE SPONGE 4X4 12PLY STRL (GAUZE/BANDAGES/DRESSINGS) ×1 IMPLANT
GLIDEWIRE ADV .035X180CM (WIRE) IMPLANT
GLOVE BIO SURGEON STRL SZ8 (GLOVE) ×1 IMPLANT
GOWN STRL REUS W/ TWL LRG LVL3 (GOWN DISPOSABLE) ×1 IMPLANT
GOWN STRL REUS W/ TWL XL LVL3 (GOWN DISPOSABLE) ×1 IMPLANT
GOWN STRL REUS W/TWL LRG LVL3 (GOWN DISPOSABLE) ×1
GOWN STRL REUS W/TWL XL LVL3 (GOWN DISPOSABLE) ×1
KIT BASIN OR (CUSTOM PROCEDURE TRAY) ×1 IMPLANT
KIT TURNOVER KIT B (KITS) ×1 IMPLANT
NDL 18GX1X1/2 (RX/OR ONLY) (NEEDLE) ×1 IMPLANT
NDL HYPO 25GX1X1/2 BEV (NEEDLE) ×1 IMPLANT
NEEDLE 18GX1X1/2 (RX/OR ONLY) (NEEDLE) ×1 IMPLANT
NEEDLE HYPO 25GX1X1/2 BEV (NEEDLE) ×1 IMPLANT
NS IRRIG 1000ML POUR BTL (IV SOLUTION) ×1 IMPLANT
PACK BASIC III (CUSTOM PROCEDURE TRAY) ×1
PACK SRG BSC III STRL LF ECLPS (CUSTOM PROCEDURE TRAY) ×1 IMPLANT
PAD ARMBOARD 7.5X6 YLW CONV (MISCELLANEOUS) ×2 IMPLANT
SET MICROPUNCTURE 5F STIFF (MISCELLANEOUS) ×1 IMPLANT
STRIP CLOSURE SKIN 1/2X4 (GAUZE/BANDAGES/DRESSINGS) ×1 IMPLANT
SUT ETHILON 3 0 PS 1 (SUTURE) ×1 IMPLANT
SUT MNCRL AB 4-0 PS2 18 (SUTURE) ×1 IMPLANT
SYR 10ML LL (SYRINGE) ×1 IMPLANT
SYR 20ML LL LF (SYRINGE) ×2 IMPLANT
SYR 5ML LL (SYRINGE) ×1 IMPLANT
SYR CONTROL 10ML LL (SYRINGE) ×1 IMPLANT
TOWEL GREEN STERILE (TOWEL DISPOSABLE) ×1 IMPLANT
TOWEL GREEN STERILE FF (TOWEL DISPOSABLE) ×2 IMPLANT
WATER STERILE IRR 1000ML POUR (IV SOLUTION) ×1 IMPLANT
WIRE BENTSON .035X145CM (WIRE) IMPLANT

## 2022-06-28 NOTE — Progress Notes (Signed)
[facility] [place] PROGRESS NOTE  ASSESSMENT / PLAN: Barbara Donovan is a 32 y.o. female status post removal of infected left arm graft.  Blood cultures from 6/5 and 6/7 are negative. She is safe to proceed with tunneled dialysis catheter placement. She has had many of these in the past. I counseled her we may not be able to place this in a jugular vein. She is understanding.   SUBJECTIVE: No complaints.   OBJECTIVE: BP (!) 146/83 (BP Location: Right Arm)   Pulse 76   Temp 98 F (36.7 C) (Oral)   Resp 18   Ht 5\' 6"  (1.676 m)   Wt 60.2 kg   SpO2 95%   BMI 21.42 kg/m   Intake/Output Summary (Last 24 hours) at 06/28/2022 0714 Last data filed at 06/27/2022 1700 Gross per 24 hour  Intake 600 ml  Output 1 ml  Net 599 ml    No distress Regular rate and rhythm Unlabored Left arm bandaged Multiple scars across chest c/w prior TDCs     Latest Ref Rng & Units 06/28/2022    3:25 AM 06/27/2022    1:54 AM 06/26/2022    2:58 AM  CBC  WBC 4.0 - 10.5 K/uL 9.2  9.7  10.6   Hemoglobin 12.0 - 15.0 g/dL 7.8  7.8  8.5   Hematocrit 36.0 - 46.0 % 23.7  24.1  27.0   Platelets 150 - 400 K/uL 552  573  526         Latest Ref Rng & Units 06/28/2022    3:25 AM 06/27/2022    1:54 AM 06/26/2022    2:58 AM  CMP  Glucose 70 - 99 mg/dL 119  147  829   BUN 6 - 20 mg/dL 91  67  52   Creatinine 0.44 - 1.00 mg/dL 56.21  30.86  57.84   Sodium 135 - 145 mmol/L 128  127  130   Potassium 3.5 - 5.1 mmol/L 4.8  4.5  4.4   Chloride 98 - 111 mmol/L 87  88  88   CO2 22 - 32 mmol/L 23  23  24    Calcium 8.9 - 10.3 mg/dL 9.0  8.9  9.4    Barbara Laws N. Lenell Antu, MD [facility] [place] Phone Number: [phone] 06/28/2022 7:14 AM

## 2022-06-28 NOTE — Progress Notes (Signed)
IR was requested for image guided tunneled HD catheter placement, but it appears that patient had left IJ tunneled HD cath placed by vascular surgery today.    Will delete the tunneled HD cath placement order.  Please call IR for questions and concerns.   Lynann Bologna Woodie Degraffenreid PA-C 06/28/2022 9:35 AM

## 2022-06-28 NOTE — Anesthesia Postprocedure Evaluation (Signed)
Anesthesia Post Note  Patient: Barbara Donovan  Procedure(s) Performed: ULTRASOUND GUIDED INSERTION OF TUNNELED DIALYSIS CATHETER (Left: Chest)     Patient location during evaluation: PACU Anesthesia Type: General Level of consciousness: awake Pain management: pain level controlled Vital Signs Assessment: post-procedure vital signs reviewed and stable Respiratory status: spontaneous breathing, nonlabored ventilation and respiratory function stable Cardiovascular status: blood pressure returned to baseline and stable Postop Assessment: no apparent nausea or vomiting Anesthetic complications: no   No notable events documented.  Last Vitals:  Vitals:   06/28/22 0930 06/28/22 0945  BP: (!) 175/93 (!) 165/90  Pulse: 71 72  Resp: 15 (!) 23  Temp:    SpO2: 95% 97%    Last Pain:  Vitals:   06/28/22 0930  TempSrc:   PainSc: Asleep                 Linton Rump

## 2022-06-28 NOTE — Progress Notes (Signed)
[facility] Progress Note   Subjective:   Patient seen and examined at bedside post surgery.  Reports pain in left arm and chest around [facility].  No other specific complaints.  Denies SOB, abdominal pain and n/v/d.   Objective Vitals:   06/28/22 0930 06/28/22 0945 06/28/22 1000 06/28/22 1024  BP: (!) 175/93 (!) 165/90 (!) 166/84 (!) 160/85  Pulse: 71 72 69 69  Resp: 15 (!) 23 10 18   Temp:   98 F (36.7 C) 98.2 F (36.8 C)  TempSrc:      SpO2: 95% 97% 98% 97%  Weight:      Height:       Physical Exam General:chronically ill appearing female in NAD Heart:RRR, no mrg Lungs:CTAB, nml WOB on RA Abdomen:soft, NTND Extremities:no LE edema Dialysis Access: L IJ [facility]   Filed Weights   06/22/22 0945 06/24/22 0913 06/24/22 1354  Weight: 65.8 kg 63.2 kg 60.2 kg    Intake/Output Summary (Last 24 hours) at 06/28/2022 1136 Last data filed at 06/28/2022 1478 Gross per 24 hour  Intake 660 ml  Output 1 ml  Net 659 ml    Additional Objective Labs: Basic Metabolic Panel: Recent Labs  Lab 06/26/22 0258 06/27/22 0154 06/28/22 0325  NA 130* 127* 128*  K 4.4 4.5 4.8  CL 88* 88* 87*  CO2 24 23 23   GLUCOSE 148* 227* 216*  BUN 52* 67* 91*  CREATININE 10.81* 13.18* 15.73*  CALCIUM 9.4 8.9 9.0  PHOS 5.3* 5.3* 6.1*   Liver Function Tests: Recent Labs  Lab 06/22/22 0825 06/23/22 0803 06/26/22 0258 06/27/22 0154 06/28/22 0325  AST 20  --   --   --   --   ALT 33  --   --   --   --   ALKPHOS 99  --   --   --   --   BILITOT 0.8  --   --   --   --   PROT 7.2  --   --   --   --   ALBUMIN 3.2*   < > 2.7* 2.5* 2.6*   < > = values in this interval not displayed.   CBC: Recent Labs  Lab 06/24/22 0353 06/25/22 0406 06/26/22 0258 06/27/22 0154 06/28/22 0325  WBC 9.3 9.4 10.6* 9.7 9.2  NEUTROABS 6.0 5.7 6.2 5.8 5.8  HGB 8.3* 8.9* 8.5* 7.8* 7.8*  HCT 25.7* 28.6* 27.0* 24.1* 23.7*  MCV 90.8 91.7 93.8 89.9 91.2  PLT 423* 500* 526* 573* 552*   Blood Culture     Component Value Date/Time   SDES BLOOD RIGHT HAND 06/25/2022 0407   SPECREQUEST  06/25/2022 0407    BOTTLES DRAWN AEROBIC AND ANAEROBIC Blood Culture adequate volume   CULT  06/25/2022 0407    NO GROWTH 3 DAYS Performed at [facility], [address], [place], [place] [address]    REPTSTATUS PENDING 06/25/2022 0407   CBG: Recent Labs  Lab 06/27/22 1130 06/27/22 1703 06/27/22 2053 06/28/22 0837 06/28/22 1025  GLUCAP 250* 179* 300* 167* 169*    Studies/Results: DG Chest 1 View  Result Date: 06/28/2022 CLINICAL DATA:  Dialysis catheter placement EXAM: CHEST  1 VIEW COMPARISON:  06/26/2022 FINDINGS: Tunneled left IJ hemodialysis catheter extends to the proximal right atrium. No pneumothorax. Relatively low lung volumes with some left retrocardiac consolidation/atelectasis. Heart size and mediastinal contours are within normal limits. No effusion. Visualized bones unremarkable. IMPRESSION: Tunneled left IJ hemodialysis catheter to the proximal right atrium.  No pneumothorax. Electronically Signed   By: Corlis Leak M.D.   On: 06/28/2022 10:03   HYBRID OR IMAGING ([facility] ONLY)  Result Date: 06/28/2022 There is no interpretation for this exam.  This order is for images obtained during a surgical procedure.  Please See "Surgeries" Tab for more information regarding the procedure.    Medications:  sodium chloride Stopped (06/28/22 0832)    ceFAZolin (ANCEF) IV 1 g (06/27/22 2124)    amLODipine  10 mg Oral QHS   calcitRIOL  0.5 mcg Oral Daily   carvedilol  25 mg Oral BID WC   Chlorhexidine Gluconate Cloth  6 each Topical Q0600   cinacalcet  30 mg Oral Q supper   [START ON 06/29/2022] darbepoetin (ARANESP) injection - DIALYSIS  100 mcg Subcutaneous Q Tue-1800   insulin aspart  0-6 Units Subcutaneous TID WC   insulin glargine-yfgn  9 Units Subcutaneous Daily   mirtazapine  15 mg Oral QHS   multivitamin  1 tablet Oral QHS   oxyCODONE       polyethylene glycol  17 g Oral Daily    senna-docusate  1 tablet Oral BID   sucroferric oxyhydroxide  1,500 mg Oral TID WC    Dialysis Orders: HHD  MTTF Dialyzes at [facility] HD,  3 hrs, 4x/week, EDW 63.4Kg. HD Bath 2k, BFR 400. Access: LUE AVG removed on 6/4. Mircera 150 mcg on 5/14 Calcitriol 0.5 mcg daily Sensipar 30 mg daily Velphoro 3 tabs w/meal   Assessment/Plan: # I. 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 S/p temp cath removal after HD 6/6. Repeat blood cultures 6/7 NGTD. Now on line holiday. LU AVG excised today with TDC placement by Dr. Lenell Antu.  Appreciate his assistance.  Plan for TEE on 6/11. Continue Ancef   # ESRD on HHD 4x/week: Last dialysis about 5 days prior to admission, then HD 06/22/2022 finished around 5 AM today. Received HD 06/24/2022. Now on line holiday. Next HD 6/10 after TDC placement. See above. She will need to return to in-center HD while using [facility] - waiting for placement. Renal navigator notified for CLIP and working on finding outpatient HD schedule to accommodate her child care needs.   # Hypertension/volume overload: BP elevated. Continue home medications. Reached OP EDW after last HD Continue to lower EDW as tolerated.    # Anemia of ESRD: HGB trending down, now 7.8. Will raise Aranesp to 150 mcg SQ. Follow HGB. No iron D/T infection.    # Secondary hyperparathyroidism/hyperphosphatemia: Calcium in goal. Phos usually in goal.  Continue calcitriol, Sensipar and binders.   # Hypervolemic hyponatremia, hyperkalemia, acidosis all due to incomplete/missing dialysis. Improved with dialysis, Monitor labs while she is on line holiday.  Virgina Norfolk, PA-C [facility] 06/28/2022,11:36 AM  LOS: 5 days

## 2022-06-28 NOTE — Progress Notes (Addendum)
Contacted [facility] admissions to request confirmation of pt's out-pt HD clinic/schedule. Awaiting response.   Olivia Canter Renal Navigator [phone]  Addendum at 11:54 am: Pt has been accepted at [facility] on MWF 11:45 am chair time. Pt can start on Wednesday and will need to arrive at 11:15 to complete paperwork prior to treatment. Met with pt at bedside and provided schedule letter. Pt agreeable to plan. Update provided to nephrologist and renal PA as well. Will add to pt's AVS. Will assist as needed.

## 2022-06-28 NOTE — Transfer of Care (Signed)
Immediate Anesthesia Transfer of Care Note  Patient: Barbara Donovan  Procedure(s) Performed: ULTRASOUND GUIDED INSERTION OF TUNNELED DIALYSIS CATHETER (Left: Chest)  Patient Location: PACU  Anesthesia Type:General  Level of Consciousness: drowsy and patient cooperative  Airway & Oxygen Therapy: Patient Spontanous Breathing  Post-op Assessment: Report given to RN and Post -op Vital signs reviewed and stable  Post vital signs: Reviewed and stable  Last Vitals:  Vitals Value Taken Time  BP 135/77 06/28/22 0834  Temp    Pulse 74 06/28/22 0837  Resp 13 06/28/22 0837  SpO2 97 % 06/28/22 0837  Vitals shown include unvalidated device data.  Last Pain:  Vitals:   06/28/22 0621  TempSrc: Oral  PainSc:       Patients Stated Pain Goal: 2 (06/27/22 2133)  Complications: No notable events documented.

## 2022-06-28 NOTE — Op Note (Signed)
DATE OF SERVICE: 06/28/2022  PATIENT:  Barbara Donovan  32 y.o. female  PRE-OPERATIVE DIAGNOSIS:  ESRD, status post excision of infected left arm arteriovenous graft  POST-OPERATIVE DIAGNOSIS:  Same  PROCEDURE:   ultrasound guided, fluroscopic guided tunneled dialysis catheter placement (LIJ 23cm).  SURGEON:  Surgeon(s) and Role:    * Adanya Sosinski, Rande Brunt, MD - Primary  ASSISTANT: none  ANESTHESIA:   general  EBL: minimal  BLOOD ADMINISTERED:none  DRAINS: none   LOCAL MEDICATIONS USED:  NONE  SPECIMEN:  none  COUNTS: confirmed correct.  TOURNIQUET:  none  PATIENT DISPOSITION:  PACU - hemodynamically stable.   Delay start of Pharmacological VTE agent (>24hrs) due to surgical blood loss or risk of bleeding: no  INDICATION FOR PROCEDURE: Barbara Donovan is a 32 y.o. female with ESRD and infected left arm graft status post excision. She has no evidence of bacteremia on blood cultures from 6/7 and 6/9. After careful discussion of risks, benefits, and alternatives the patient was offered tunneled dialysis catheter placement. The patient understood and wished to proceed.  OPERATIVE FINDINGS: successful placement of tunneled dialysis catheter via left internal jugular approach.  DESCRIPTION OF PROCEDURE: After identification of the patient in the pre-operative holding area, the patient was transferred to the operating room. The patient was positioned supine on the operating room table. Anesthesia was induced. The neck and chest were prepped and draped in standard fashion. A surgical pause was performed confirming correct patient, procedure, and operative location.  Using ultrasound guidance the left internal jugular vein was accessed with micropuncture technique.  Through the micropuncture sheath a floppy J-wire was advanced into the superior vena cava.  A small incision was made around the skin access point.  The access point was serially dilated under direct fluoroscopic guidance.  A  peel-away sheath was introduced into the superior vena cava under fluoroscopic guidance.  A counterincision was made in the chest under the clavicle.  A 23 cm tunnel dialysis catheter was then tunneled under the skin, over the clavicle into the incision in the neck.  The tunneling device was removed and the catheter fed through the peel-away sheath into the superior vena cava.  The peel-away sheath was removed and the catheter gently pulled back.  Adequate position was confirmed with x-ray.  The catheter was tested and found to flush and draw back well.  Catheter was heparin locked.  Caps were applied.  Catheter was sutured to the skin.  The neck incision was closed with 4-0 Monocryl.  Upon completion of the case instrument and sharps counts were confirmed correct. The patient was transferred to the PACU in good condition. I was present for all portions of the procedure.  Rande Brunt. Lenell Antu, MD [facility] Phone Number: [phone] 06/28/2022 8:26 AM

## 2022-06-28 NOTE — Anesthesia Procedure Notes (Signed)
Procedure Name: LMA Insertion Date/Time: 06/28/2022 7:44 AM  Performed by: Gus Puma, CRNAPre-anesthesia Checklist: Patient identified, Emergency Drugs available, Suction available and Patient being monitored Patient Re-evaluated:Patient Re-evaluated prior to induction Oxygen Delivery Method: Circle System Utilized Preoxygenation: Pre-oxygenation with 100% oxygen Induction Type: IV induction Ventilation: Mask ventilation without difficulty LMA: LMA inserted LMA Size: 4.0 Number of attempts: 1 Airway Equipment and Method: Bite block Placement Confirmation: positive ETCO2 Tube secured with: Tape Dental Injury: Teeth and Oropharynx as per pre-operative assessment

## 2022-06-28 NOTE — Progress Notes (Signed)
   [facility] has been requested to perform a transesophageal echocardiogram on Barbara Donovan for bacteremia.  After careful review of history and examination, the risks and benefits of transesophageal echocardiogram have been explained including risks of esophageal damage, perforation (1:10,000 risk), bleeding, pharyngeal hematoma as well as other potential complications associated with conscious sedation including aspiration, arrhythmia, respiratory failure and death. Alternatives to treatment were discussed, questions were answered. Patient is willing to proceed.   NPO at [facility] tonight please, TEE tomorrow with Dr. Eden Emms.  Marcelino Duster, [place]  06/28/2022 3:33 PM

## 2022-06-28 NOTE — Progress Notes (Signed)
Subjective:  Left sided neck pain   Antibiotics:  Anti-infectives (From admission, onward)    Start     Dose/Rate Route Frequency Ordered Stop   06/25/22 2200  ceFAZolin (ANCEF) IVPB 1 g/50 mL premix        1 g 100 mL/hr over 30 Minutes Intravenous Every 24 hours 06/25/22 1001     06/25/22 0130  vancomycin (VANCOREADY) IVPB 1250 mg/250 mL        1,250 mg 166.7 mL/hr over 90 Minutes Intravenous  Once 06/25/22 0043 06/25/22 0441   06/23/22 2200  ceFAZolin (ANCEF) IVPB 1 g/50 mL premix  Status:  Discontinued        1 g 100 mL/hr over 30 Minutes Intravenous Every 24 hours 06/22/22 2313 06/25/22 0042   06/23/22 1000  ceFEPIme (MAXIPIME) 1 g in sodium chloride 0.9 % 100 mL IVPB  Status:  Discontinued        1 g 200 mL/hr over 30 Minutes Intravenous Every 24 hours 06/22/22 0923 06/22/22 2259   06/23/22 0915  ceFEPIme (MAXIPIME) 2 g in sodium chloride 0.9 % 100 mL IVPB  Status:  Discontinued        2 g 200 mL/hr over 30 Minutes Intravenous Every 24 hours 06/22/22 0921 06/22/22 0932   06/22/22 2315  ceFAZolin (ANCEF) IVPB 1 g/50 mL premix        1 g 100 mL/hr over 30 Minutes Intravenous  Once 06/22/22 2313 06/23/22 0657   06/22/22 2300  vancomycin (VANCOREADY) IVPB 750 mg/150 mL  Status:  Discontinued        750 mg 150 mL/hr over 60 Minutes Intravenous Every Tue (Hemodialysis) 06/22/22 2201 06/22/22 2259   06/22/22 0945  ceFEPIme (MAXIPIME) 2 g in sodium chloride 0.9 % 100 mL IVPB        2 g 200 mL/hr over 30 Minutes Intravenous  Once 06/22/22 0932 06/22/22 1551   06/22/22 0915  vancomycin (VANCOREADY) IVPB 1500 mg/300 mL        1,500 mg 150 mL/hr over 120 Minutes Intravenous  Once 06/22/22 0907 06/22/22 1551   06/22/22 0915  ceFEPIme (MAXIPIME) 1 g in sodium chloride 0.9 % 100 mL IVPB  Status:  Discontinued        1 g 200 mL/hr over 30 Minutes Intravenous Every 24 hours 06/22/22 0907 06/22/22 0921   06/22/22 0911  vancomycin variable dose per unstable renal function  (pharmacist dosing)  Status:  Discontinued         Does not apply See admin instructions 06/22/22 0911 06/22/22 2259       Medications: Scheduled Meds:  acetaminophen  1,000 mg Oral Q8H   amLODipine  10 mg Oral QHS   calcitRIOL  0.5 mcg Oral Daily   carvedilol  25 mg Oral BID WC   Chlorhexidine Gluconate Cloth  6 each Topical Q0600   cinacalcet  30 mg Oral Q supper   [START ON 06/29/2022] darbepoetin (ARANESP) injection - DIALYSIS  150 mcg Subcutaneous Q Tue-1800   insulin aspart  0-6 Units Subcutaneous TID WC   insulin glargine-yfgn  9 Units Subcutaneous Daily   mirtazapine  15 mg Oral QHS   multivitamin  1 tablet Oral QHS   oxyCODONE       polyethylene glycol  17 g Oral Daily   senna-docusate  1 tablet Oral BID   sucroferric oxyhydroxide  1,500 mg Oral TID WC   Continuous Infusions:  sodium chloride Stopped (06/28/22 0832)  ceFAZolin (ANCEF) IV 1 g (06/27/22 2124)   PRN Meds:.sodium chloride, alteplase, heparin, HYDROmorphone (DILAUDID) injection, HYDROmorphone, lidocaine (PF), lidocaine-prilocaine, oxyCODONE, pentafluoroprop-tetrafluoroeth, phenol    Objective: Weight change:   Intake/Output Summary (Last 24 hours) at 06/28/2022 1522 Last data filed at 06/28/2022 0981 Gross per 24 hour  Intake 540 ml  Output 1 ml  Net 539 ml    Blood pressure (!) 160/85, pulse 69, temperature 98.2 F (36.8 C), resp. rate 18, height 5\' 6"  (1.676 m), weight 60.2 kg, SpO2 97 %. Temp:  [97.7 F (36.5 C)-98.3 F (36.8 C)] 98.2 F (36.8 C) (06/10 1024) Pulse Rate:  [68-76] 69 (06/10 1024) Resp:  [6-23] 18 (06/10 1024) BP: (135-175)/(77-101) 160/85 (06/10 1024) SpO2:  [95 %-99 %] 97 % (06/10 1024)  Physical Exam: Physical Exam Constitutional:      General: She is not in acute distress.    Appearance: She is well-developed. She is not diaphoretic.  HENT:     Head: Normocephalic and atraumatic.     Right Ear: External ear normal.     Left Ear: External ear normal.      Mouth/Throat:     Pharynx: No oropharyngeal exudate.  Eyes:     General: No scleral icterus.    Conjunctiva/sclera: Conjunctivae normal.     Pupils: Pupils are equal, round, and reactive to light.  Cardiovascular:     Rate and Rhythm: Normal rate and regular rhythm.  Pulmonary:     Effort: Pulmonary effort is normal. No respiratory distress.     Breath sounds: Normal breath sounds. No wheezing.  Abdominal:     General: There is no distension.     Palpations: Abdomen is soft.  Musculoskeletal:        General: No tenderness. Normal range of motion.  Lymphadenopathy:     Cervical: No cervical adenopathy.  Skin:    General: Skin is warm and dry.     Coloration: Skin is not pale.     Findings: No erythema or rash.  Neurological:     General: No focal deficit present.     Mental Status: She is alert and oriented to person, place, and time.     Motor: No abnormal muscle tone.     Coordination: Coordination normal.  Psychiatric:        Mood and Affect: Mood normal.        Behavior: Behavior normal.        Thought Content: Thought content normal.        Judgment: Judgment normal.     AVG site clean  IJ removed  CBC:    BMET Recent Labs    06/27/22 0154 06/28/22 0325  NA 127* 128*  K 4.5 4.8  CL 88* 87*  CO2 23 23  GLUCOSE 227* 216*  BUN 67* 91*  CREATININE 13.18* 15.73*  CALCIUM 8.9 9.0      Liver Panel  Recent Labs    06/27/22 0154 06/28/22 0325  ALBUMIN 2.5* 2.6*        Sedimentation Rate No results for input(s): "ESRSEDRATE" in the last 72 hours. C-Reactive Protein No results for input(s): "CRP" in the last 72 hours.  Micro Results: Recent Results (from the past 720 hour(s))  Culture, blood (Routine x 2)     Status: Abnormal   Collection Time: 06/22/22  7:37 AM   Specimen: BLOOD RIGHT ARM  Result Value Ref Range Status   Specimen Description BLOOD RIGHT ARM  Final   Special Requests  Final    BOTTLES DRAWN AEROBIC AND ANAEROBIC Blood  Culture adequate volume   Culture  Setup Time   Final    GRAM POSITIVE COCCI IN CLUSTERS IN BOTH AEROBIC AND ANAEROBIC BOTTLES CRITICAL RESULT CALLED TO, READ BACK BY AND VERIFIED WITH: PHARMD JAMES LEDFORD ON 06/22/22 @ 2239 BY DRT Performed at [facility], [address], [place], [place] [address]    Culture STAPHYLOCOCCUS AUREUS (A)  Final   Report Status 06/24/2022 FINAL  Final   Organism ID, Bacteria STAPHYLOCOCCUS AUREUS  Final      Susceptibility   Staphylococcus aureus - MIC*    CIPROFLOXACIN <=0.5 SENSITIVE Sensitive     ERYTHROMYCIN >=8 RESISTANT Resistant     GENTAMICIN <=0.5 SENSITIVE Sensitive     OXACILLIN <=0.25 SENSITIVE Sensitive     TETRACYCLINE >=16 RESISTANT Resistant     VANCOMYCIN 1 SENSITIVE Sensitive     TRIMETH/SULFA <=10 SENSITIVE Sensitive     CLINDAMYCIN <=0.25 SENSITIVE Sensitive     RIFAMPIN <=0.5 SENSITIVE Sensitive     Inducible Clindamycin NEGATIVE Sensitive     LINEZOLID 2 SENSITIVE Sensitive     * STAPHYLOCOCCUS AUREUS  Blood Culture ID Panel (Reflexed)     Status: Abnormal   Collection Time: 06/22/22  7:37 AM  Result Value Ref Range Status   Enterococcus faecalis NOT DETECTED NOT DETECTED Final   Enterococcus Faecium NOT DETECTED NOT DETECTED Final   Listeria monocytogenes NOT DETECTED NOT DETECTED Final   Staphylococcus species DETECTED (A) NOT DETECTED Final    Comment: CRITICAL RESULT CALLED TO, READ BACK BY AND VERIFIED WITH: PHARMD JAMES LEDFORD ON 06/22/22 @ 2239 BY DRT    Staphylococcus aureus (BCID) DETECTED (A) NOT DETECTED Final    Comment: CRITICAL RESULT CALLED TO, READ BACK BY AND VERIFIED WITH: PHARMD JAMES LEDFORD ON 06/22/22 @ 2239 BY DRT    Staphylococcus epidermidis NOT DETECTED NOT DETECTED Final   Staphylococcus lugdunensis NOT DETECTED NOT DETECTED Final   Streptococcus species NOT DETECTED NOT DETECTED Final   Streptococcus agalactiae NOT DETECTED NOT DETECTED Final   Streptococcus pneumoniae NOT DETECTED NOT  DETECTED Final   Streptococcus pyogenes NOT DETECTED NOT DETECTED Final   A.calcoaceticus-baumannii NOT DETECTED NOT DETECTED Final   Bacteroides fragilis NOT DETECTED NOT DETECTED Final   Enterobacterales NOT DETECTED NOT DETECTED Final   Enterobacter cloacae complex NOT DETECTED NOT DETECTED Final   Escherichia coli NOT DETECTED NOT DETECTED Final   Klebsiella aerogenes NOT DETECTED NOT DETECTED Final   Klebsiella oxytoca NOT DETECTED NOT DETECTED Final   Klebsiella pneumoniae NOT DETECTED NOT DETECTED Final   Proteus species NOT DETECTED NOT DETECTED Final   Salmonella species NOT DETECTED NOT DETECTED Final   Serratia marcescens NOT DETECTED NOT DETECTED Final   Haemophilus influenzae NOT DETECTED NOT DETECTED Final   Neisseria meningitidis NOT DETECTED NOT DETECTED Final   Pseudomonas aeruginosa NOT DETECTED NOT DETECTED Final   Stenotrophomonas maltophilia NOT DETECTED NOT DETECTED Final   Candida albicans NOT DETECTED NOT DETECTED Final   Candida auris NOT DETECTED NOT DETECTED Final   Candida glabrata NOT DETECTED NOT DETECTED Final   Candida krusei NOT DETECTED NOT DETECTED Final   Candida parapsilosis NOT DETECTED NOT DETECTED Final   Candida tropicalis NOT DETECTED NOT DETECTED Final   Cryptococcus neoformans/gattii NOT DETECTED NOT DETECTED Final   Meth resistant mecA/C and MREJ NOT DETECTED NOT DETECTED Final    Comment: Performed at [facility], [address] [place],  [place] [address]  Aerobic/Anaerobic Culture w Gram Stain (surgical/deep wound)     Status: None   Collection Time: 06/22/22 10:52 AM   Specimen: Wound; Body Fluid  Result Value Ref Range Status   Specimen Description FLUID  Final   Special Requests LEFT ARM PERI GRAFT  Final   Gram Stain   Final    MODERATE WBC PRESENT, PREDOMINANTLY PMN RARE SQUAMOUS EPITHELIAL CELLS PRESENT FEW GRAM POSITIVE COCCI    Culture   Final    FEW STAPHYLOCOCCUS AUREUS NO ANAEROBES ISOLATED Performed at [facility], [address], [place], [place] [address]    Report Status 06/27/2022 FINAL  Final   Organism ID, Bacteria STAPHYLOCOCCUS AUREUS  Final      Susceptibility   Staphylococcus aureus - MIC*    CIPROFLOXACIN <=0.5 SENSITIVE Sensitive     ERYTHROMYCIN >=8 RESISTANT Resistant     GENTAMICIN <=0.5 SENSITIVE Sensitive     OXACILLIN <=0.25 SENSITIVE Sensitive     TETRACYCLINE >=16 RESISTANT Resistant     VANCOMYCIN <=0.5 SENSITIVE Sensitive     TRIMETH/SULFA <=10 SENSITIVE Sensitive     CLINDAMYCIN <=0.25 SENSITIVE Sensitive     RIFAMPIN <=0.5 SENSITIVE Sensitive     Inducible Clindamycin NEGATIVE Sensitive     LINEZOLID 2 SENSITIVE Sensitive     * FEW STAPHYLOCOCCUS AUREUS  Culture, blood (Routine X 2) w Reflex to ID Panel     Status: None   Collection Time: 06/23/22  8:03 AM   Specimen: BLOOD  Result Value Ref Range Status   Specimen Description BLOOD RIGHT ANTECUBITAL  Final   Special Requests   Final    BOTTLES DRAWN AEROBIC AND ANAEROBIC Blood Culture results may not be optimal due to an inadequate volume of blood received in culture bottles   Culture   Final    NO GROWTH 5 DAYS Performed at [facility], [address], [place], [place] [address]    Report Status 06/28/2022 FINAL  Final  Culture, blood (Routine X 2) w Reflex to ID Panel     Status: None   Collection Time: 06/23/22  8:05 AM   Specimen: BLOOD RIGHT FOREARM  Result Value Ref Range Status   Specimen Description BLOOD RIGHT FOREARM  Final   Special Requests   Final    BOTTLES DRAWN AEROBIC AND ANAEROBIC Blood Culture adequate volume   Culture   Final    NO GROWTH 5 DAYS Performed at [facility], [address], [place], [place] [address]    Report Status 06/28/2022 FINAL  Final  Culture, blood (Routine X 2) w Reflex to ID Panel     Status: None (Preliminary result)   Collection Time: 06/25/22  4:06 AM   Specimen: BLOOD  Result Value Ref Range Status   Specimen Description BLOOD BLOOD  RIGHT ARM  Final   Special Requests   Final    BOTTLES DRAWN AEROBIC AND ANAEROBIC Blood Culture adequate volume   Culture   Final    NO GROWTH 3 DAYS Performed at [facility], [address] [place], [place] [address]    Report Status PENDING  Incomplete  Culture, blood (Routine X 2) w Reflex to ID Panel     Status: None (Preliminary result)   Collection Time: 06/25/22  4:07 AM   Specimen: BLOOD RIGHT HAND  Result Value Ref Range Status   Specimen Description BLOOD RIGHT HAND  Final   Special Requests   Final  BOTTLES DRAWN AEROBIC AND ANAEROBIC Blood Culture adequate volume   Culture   Final    NO GROWTH 3 DAYS Performed at [facility], [address], [place], [place] [address]    Report Status PENDING  Incomplete    Studies/Results: DG Chest 1 View  Result Date: 06/28/2022 CLINICAL DATA:  Dialysis catheter placement EXAM: CHEST  1 VIEW COMPARISON:  06/26/2022 FINDINGS: Tunneled left IJ hemodialysis catheter extends to the proximal right atrium. No pneumothorax. Relatively low lung volumes with some left retrocardiac consolidation/atelectasis. Heart size and mediastinal contours are within normal limits. No effusion. Visualized bones unremarkable. IMPRESSION: Tunneled left IJ hemodialysis catheter to the proximal right atrium. No pneumothorax. Electronically Signed   By: Corlis Leak M.D.   On: 06/28/2022 10:03   HYBRID OR IMAGING ([facility] ONLY)  Result Date: 06/28/2022 There is no interpretation for this exam.  This order is for images obtained during a surgical procedure.  Please See "Surgeries" Tab for more information regarding the procedure.      Assessment/Plan:  INTERVAL HISTORY:  Neck pain post new HD catheter placement  Principal Problem:   Infection of AV graft for dialysis [facility]) Active Problems:   MSSA bacteremia    Barbara Donovan is a 32 y.o. female with end-stage renal disease on hemodialysis admitted with AV graft infection with MSSA  bacteremia.  She had had temporary dialysis catheter placed in the context of her bacteremia and patient has negative blood cultures post removal of temp catheter  #1 AVG graft infection and bacteremia, due to MSSA  TEE planned tomorrow  I would plan on her having 6 weeks of ancef with HD regardless thru July 18th, 2024  I have personally spent 50 minutes involved in face-to-face and non-face-to-face activities for this patient on the day of the visit. Professional time spent includes the following activities: Preparing to see the patient (review of tests), Obtaining and/or reviewing separately obtained history (admission/discharge record), Performing a medically appropriate examination and/or evaluation , Ordering medications/tests/procedures, referring and communicating with other health care professionals, Documenting clinical information in the EMR, Independently interpreting results (not separately reported), Communicating results to the patient/family/caregiver, Counseling and educating the patient/family/caregiver and Care coordination (not separately reported).    Barbara Donovan has an appointment on 07/26/2022 at [facility] with Dr. Elinor Parkinson  at  [facility], which  is located in the [facility] at  [address] [place] in [place].  [address], which is located to the left of the elevators.  Phone: [phone]  Fax: [phone]  [email or web address]  The patient should arrive 30 minutes prior to their appoitment.   We will follow-up on results of TEE tomorrow.     LOS: 5 days   Acey Lav 06/28/2022, 3:22 PM

## 2022-06-28 NOTE — Progress Notes (Signed)
3x unsuccessful attempts to give report to CRNA.

## 2022-06-28 NOTE — Progress Notes (Signed)
HD#5 Subjective:   Summary: Barbara Donovan is a 32 y.o. female with pertinent PMH of ESRD on HD at home, T1DM, and HTN who presented with an infected left upper extremity AV graft and is admitted for sepsis 2/2 MSSA bacteremia.   Patient seen after dialysis catheter placement under general anesthesia this morning.  She is complaining of left arm pain over where her AV graft was removed.  She also has some pain in her left neck but it is not as bad as her arm.  She has a good left radial pulse and no significant increased warmth to her left arm.  Objective:  Vital signs in last 24 hours: Vitals:   06/28/22 0930 06/28/22 0945 06/28/22 1000 06/28/22 1024  BP: (!) 175/93 (!) 165/90 (!) 166/84 (!) 160/85  Pulse: 71 72 69 69  Resp: 15 (!) 23 10 18   Temp:   98 F (36.7 C) 98.2 F (36.8 C)  TempSrc:      SpO2: 95% 97% 98% 97%  Weight:      Height:       Supplemental O2: Room Air   Physical Exam:  Constitutional: Tired appearing adult female, in no acute distress HENT: normocephalic atraumatic, mucous membranes moist Eyes: conjunctiva non-erythematous Neck: supple, incision and bandage from left IJ tunneled dialysis catheter without signs of infection or drainage Pulmonary/Chest: normal work of breathing on room air Abdominal: soft, non-tender, non-distended, positive bowel sounds MSK: normal bulk and tone, left arm from the shoulder to the hand is wrapped in Ace bandages and gauze.  Tenderness to palpation of the left bicep where her AV graft was Neurological: alert & oriented x 3, No focal deficits noted Skin: warm and dry  Filed Weights   06/22/22 0945 06/24/22 0913 06/24/22 1354  Weight: 65.8 kg 63.2 kg 60.2 kg      Intake/Output Summary (Last 24 hours) at 06/28/2022 1325 Last data filed at 06/28/2022 4098 Gross per 24 hour  Intake 540 ml  Output 1 ml  Net 539 ml   Net IO Since Admission: 2,223.98 mL [06/28/22 1325]  Pertinent Labs:    Latest Ref Rng & Units  06/28/2022    3:25 AM 06/27/2022    1:54 AM 06/26/2022    2:58 AM  CBC  WBC 4.0 - 10.5 K/uL 9.2  9.7  10.6   Hemoglobin 12.0 - 15.0 g/dL 7.8  7.8  8.5   Hematocrit 36.0 - 46.0 % 23.7  24.1  27.0   Platelets 150 - 400 K/uL 552  573  526        Latest Ref Rng & Units 06/28/2022    3:25 AM 06/27/2022    1:54 AM 06/26/2022    2:58 AM  CMP  Glucose 70 - 99 mg/dL 119  147  829   BUN 6 - 20 mg/dL 91  67  52   Creatinine 0.44 - 1.00 mg/dL 56.21  30.86  57.84   Sodium 135 - 145 mmol/L 128  127  130   Potassium 3.5 - 5.1 mmol/L 4.8  4.5  4.4   Chloride 98 - 111 mmol/L 87  88  88   CO2 22 - 32 mmol/L 23  23  24    Calcium 8.9 - 10.3 mg/dL 9.0  8.9  9.4     Assessment/Plan:   Principal Problem:   Infection of AV graft for dialysis [facility]) Active Problems:   MSSA bacteremia   Patient Summary: Barbara Donovan is a 32  y.o. female with pertinent PMH of ESRD on HD at home, T1DM, and HTN who presented with an infected left upper extremity AV graft and is admitted for sepsis 2/2 MSSA bacteremia, on hospital day 5.   MSSA bacteremia LUE AV graft infection Has been on a line holiday since 06/24/2022.  Underwent left IJ tunneled dialysis catheter placement this morning.  She is having some increased pain over her left upper extremity AV fistula site that was removed on admission.  Continues on cefazolin, outpatient IV antibiotic orders appear to be in.  Scheduled for TEE tomorrow. - N.p.o. at midnight for TEE - Continue on cefazolin day 7 - Will evaluate left upper extremity AV fistula site when able to undress and dress this later today  ESRD on HD at home Uremia, AGMA, hyperkalemia, hyponatremia Anemia of chronic disease Last dialysis session was on 06/24/2022.  She has been on a line holiday since then.  Had a left IJ tunneled dialysis catheter placed today.  Will have HD tonight.  BMP is worsening but difficult to tell if there is any significant uremic symptoms since she is also recovering from  anesthesia this morning.  Will reevaluate in the afternoon as above to assess her left arm. - Dialysis today and dialysis per nephro  T1DM Doing well on insulin glargine 9 units daily and sliding scale  Hypertension Continue amlodipine 10 mg daily and carvedilol 25 mg twice daily  Right upper lobe lung nodule Outpatient follow-up.  Diet: Renal VTE: Heparin Code: Full  Dispo: Anticipated discharge to Home in 1-2 days pending outpatient IV antibiotics set up and TEE tomorrow.   Rocky Morel, DO Internal Medicine Resident PGY-1 Pager: [phone]  Please contact the on call pager after 5 pm and on weekends at [phone].

## 2022-06-28 NOTE — Progress Notes (Signed)
PHARMACY CONSULT NOTE FOR:  OUTPATIENT  PARENTERAL ANTIBIOTIC THERAPY (OPAT)  Informational only as the patient will receive antibiotics outpatient at the HD center. Regimen and stop date communicated to the nephrology team.  Indication: MSSA AVG infection Regimen: Cefazolin 2g/HD-MWF End date: 07/21/22 (4 weeks from 06/23/22)  IV antibiotic discharge orders are pended. To discharging provider:  please sign these orders via discharge navigator,  Select New Orders & click on the button choice - Manage This Unsigned Work.     Thank you for allowing pharmacy to be a part of this patient's care.  Georgina Pillion, PharmD, BCPS, BCIDP Infectious Diseases Clinical Pharmacist 06/29/2022 9:52 AM   **Pharmacist phone directory can now be found on [email or web address] ([email or web address]).  Listed under [facility].

## 2022-06-29 ENCOUNTER — Inpatient Hospital Stay (HOSPITAL_COMMUNITY): Payer: 59

## 2022-06-29 ENCOUNTER — Encounter (HOSPITAL_COMMUNITY): Payer: Self-pay | Admitting: Vascular Surgery

## 2022-06-29 ENCOUNTER — Other Ambulatory Visit (HOSPITAL_COMMUNITY): Payer: Self-pay

## 2022-06-29 ENCOUNTER — Encounter (HOSPITAL_COMMUNITY)
Admission: EM | Disposition: A | Discharge status: Home Health | Payer: Self-pay | Source: Home / Self Care | Attending: Internal Medicine

## 2022-06-29 ENCOUNTER — Inpatient Hospital Stay (HOSPITAL_COMMUNITY): Payer: 59 | Admitting: Certified Registered"

## 2022-06-29 DIAGNOSIS — I120 Hypertensive chronic kidney disease with stage 5 chronic kidney disease or end stage renal disease: Secondary | ICD-10-CM | POA: Diagnosis not present

## 2022-06-29 DIAGNOSIS — N186 End stage renal disease: Secondary | ICD-10-CM

## 2022-06-29 DIAGNOSIS — E1122 Type 2 diabetes mellitus with diabetic chronic kidney disease: Secondary | ICD-10-CM

## 2022-06-29 DIAGNOSIS — D631 Anemia in chronic kidney disease: Secondary | ICD-10-CM

## 2022-06-29 DIAGNOSIS — Z794 Long term (current) use of insulin: Secondary | ICD-10-CM

## 2022-06-29 DIAGNOSIS — T827XXA Infection and inflammatory reaction due to other cardiac and vascular devices, implants and grafts, initial encounter: Secondary | ICD-10-CM | POA: Diagnosis not present

## 2022-06-29 DIAGNOSIS — E1022 Type 1 diabetes mellitus with diabetic chronic kidney disease: Secondary | ICD-10-CM | POA: Diagnosis not present

## 2022-06-29 DIAGNOSIS — I330 Acute and subacute infective endocarditis: Secondary | ICD-10-CM

## 2022-06-29 DIAGNOSIS — Z992 Dependence on renal dialysis: Secondary | ICD-10-CM

## 2022-06-29 DIAGNOSIS — I088 Other rheumatic multiple valve diseases: Secondary | ICD-10-CM | POA: Diagnosis not present

## 2022-06-29 HISTORY — PX: TEE WITHOUT CARDIOVERSION: SHX5443

## 2022-06-29 LAB — CBC WITH DIFFERENTIAL/PLATELET
Abs Immature Granulocytes: 0.15 10*3/uL — ABNORMAL HIGH (ref 0.00–0.07)
Basophils Absolute: 0.1 10*3/uL (ref 0.0–0.1)
Basophils Relative: 1 %
Eosinophils Absolute: 0.3 10*3/uL (ref 0.0–0.5)
Eosinophils Relative: 3 %
HCT: 24.5 % — ABNORMAL LOW (ref 36.0–46.0)
Hemoglobin: 7.8 g/dL — ABNORMAL LOW (ref 12.0–15.0)
Immature Granulocytes: 2 %
Lymphocytes Relative: 16 %
Lymphs Abs: 1.4 10*3/uL (ref 0.7–4.0)
MCH: 28.7 pg (ref 26.0–34.0)
MCHC: 31.8 g/dL (ref 30.0–36.0)
MCV: 90.1 fL (ref 80.0–100.0)
Monocytes Absolute: 0.6 10*3/uL (ref 0.1–1.0)
Monocytes Relative: 7 %
Neutro Abs: 6.2 10*3/uL (ref 1.7–7.7)
Neutrophils Relative %: 71 %
Platelets: 579 10*3/uL — ABNORMAL HIGH (ref 150–400)
RBC: 2.72 MIL/uL — ABNORMAL LOW (ref 3.87–5.11)
RDW: 17.3 % — ABNORMAL HIGH (ref 11.5–15.5)
WBC: 8.7 10*3/uL (ref 4.0–10.5)
nRBC: 0 % (ref 0.0–0.2)

## 2022-06-29 LAB — BASIC METABOLIC PANEL
Anion gap: 17 — ABNORMAL HIGH (ref 5–15)
BUN: 53 mg/dL — ABNORMAL HIGH (ref 6–20)
CO2: 22 mmol/L (ref 22–32)
Calcium: 9.2 mg/dL (ref 8.9–10.3)
Chloride: 95 mmol/L — ABNORMAL LOW (ref 98–111)
Creatinine, Ser: 10.38 mg/dL — ABNORMAL HIGH (ref 0.44–1.00)
GFR, Estimated: 5 mL/min — ABNORMAL LOW (ref >60)
Glucose, Bld: 193 mg/dL — ABNORMAL HIGH (ref 70–99)
Potassium: 5.2 mmol/L — ABNORMAL HIGH (ref 3.5–5.1)
Sodium: 134 mmol/L — ABNORMAL LOW (ref 135–145)

## 2022-06-29 LAB — RENAL FUNCTION PANEL
Albumin: 2.6 g/dL — ABNORMAL LOW (ref 3.5–5.0)
Anion gap: 19 — ABNORMAL HIGH (ref 5–15)
BUN: 45 mg/dL — ABNORMAL HIGH (ref 6–20)
CO2: 25 mmol/L (ref 22–32)
Calcium: 9.2 mg/dL (ref 8.9–10.3)
Chloride: 89 mmol/L — ABNORMAL LOW (ref 98–111)
Creatinine, Ser: 9.08 mg/dL — ABNORMAL HIGH (ref 0.44–1.00)
GFR, Estimated: 5 mL/min — ABNORMAL LOW (ref >60)
Glucose, Bld: 307 mg/dL — ABNORMAL HIGH (ref 70–99)
Phosphorus: 4.2 mg/dL (ref 2.5–4.6)
Potassium: 4 mmol/L (ref 3.5–5.1)
Sodium: 133 mmol/L — ABNORMAL LOW (ref 135–145)

## 2022-06-29 LAB — GLUCOSE, CAPILLARY
Glucose-Capillary: 232 mg/dL — ABNORMAL HIGH (ref 70–99)
Glucose-Capillary: 234 mg/dL — ABNORMAL HIGH (ref 70–99)
Glucose-Capillary: 167 mg/dL — ABNORMAL HIGH (ref 70–99)
Glucose-Capillary: 178 mg/dL — ABNORMAL HIGH (ref 70–99)

## 2022-06-29 LAB — ECHO TEE

## 2022-06-29 SURGERY — ECHOCARDIOGRAM, TRANSESOPHAGEAL
Anesthesia: Monitor Anesthesia Care

## 2022-06-29 MED ORDER — SODIUM CHLORIDE 0.9 % IV SOLN: INTRAVENOUS | Status: DC

## 2022-06-29 MED ORDER — PROPOFOL 500 MG/50ML IV EMUL
INTRAVENOUS | Status: DC | PRN
  Administered 2022-06-29: 150 ug/kg/min via INTRAVENOUS

## 2022-06-29 MED ORDER — PROPOFOL 10 MG/ML IV BOLUS
INTRAVENOUS | Status: DC | PRN
  Administered 2022-06-29: 50 mg via INTRAVENOUS
  Administered 2022-06-29: 70 mg via INTRAVENOUS
  Administered 2022-06-29: 30 mg via INTRAVENOUS

## 2022-06-29 MED ORDER — CEFAZOLIN SODIUM-DEXTROSE 2-4 GM/100ML-% IV SOLN: 2.0000 g | INTRAVENOUS | Status: DC

## 2022-06-29 MED ORDER — CHLORHEXIDINE GLUCONATE CLOTH 2 % EX PADS
6.0000 | MEDICATED_PAD | Freq: Every day | CUTANEOUS | 0 refills | Status: DC
  Filled 2022-06-29: qty 120, 20d supply, fill #0

## 2022-06-29 MED ORDER — LIDOCAINE 2% (20 MG/ML) 5 ML SYRINGE
INTRAMUSCULAR | Status: DC | PRN
  Administered 2022-06-29: 60 mg via INTRAVENOUS

## 2022-06-29 MED ORDER — CEFAZOLIN SODIUM-DEXTROSE 1-4 GM/50ML-% IV SOLN
1.0000 g | Freq: Once | INTRAVENOUS | Status: AC
  Administered 2022-06-29: 1 g via INTRAVENOUS
  Filled 2022-06-29: qty 50

## 2022-06-29 MED ORDER — CEFAZOLIN IV (FOR PTA / DISCHARGE USE ONLY)
2.0000 g | INTRAVENOUS | 0 refills | Status: AC
Start: 2022-06-30 — End: 2022-07-21

## 2022-06-29 NOTE — Anesthesia Postprocedure Evaluation (Signed)
Anesthesia Post Note  Patient: Barbara Donovan  Procedure(s) Performed: TRANSESOPHAGEAL ECHOCARDIOGRAM     Patient location during evaluation: Cath Lab Anesthesia Type: MAC Level of consciousness: patient cooperative, oriented and sedated Pain management: pain level controlled Vital Signs Assessment: post-procedure vital signs reviewed and stable Respiratory status: nonlabored ventilation, spontaneous breathing and respiratory function stable Cardiovascular status: blood pressure returned to baseline and stable Postop Assessment: no apparent nausea or vomiting Anesthetic complications: no   No notable events documented.  Last Vitals:  Vitals:   06/29/22 0910 06/29/22 0927  BP: (!) 154/92 (!) 154/85  Pulse: 83 84  Resp: 16 18  Temp: 36.8 C 36.7 C  SpO2: 100% 100%    Last Pain:  Vitals:   06/29/22 0927  TempSrc: Oral  PainSc:                  Laura-Lee Villegas,E. Sylvain Hasten

## 2022-06-29 NOTE — Plan of Care (Signed)
[facility]  Initial Hemodialysis Orders  Dialysis center: [facility]   Patient's name: Barbara Donovan DOB: 1990-02-07 AKI or ESRD: ESRD  Discharge diagnosis: MSSA bacteremia 2/2 AVG infection s/p line holiday and LU AVG excision on 06/28/22. TEE with no vegetations.   *On cefazolin 2g qHD until 07/21/22* 2. Transition from HHD to iHD due to AVG infection  Allergies: No Known Allergies   Dialysis Prescription: Dialysis Frequency: TTS Tx duration: 4hrs BFR: 400 DFR: 600  EDW: 63.4kg  Dialyzer: 180NRe UF profile/Sodium modeling?: no Dialysis Bath: 2 K 2 Ca  Dialysis access: Access type: [facility] Date placed: 06/28/22  Surgeon: Lenell Antu   In Center Medications: Heparin Dose: 2000 units Bolus  Type: porcine VDRA: Calcitriol q HD Venofer: none Mircera: q2wks Next dose due: 07/06/22 Aranesp to be given today (06/29/22) Sensipar: none - takes at home   ANTIBIOTICS: Cefazolin 2g qHD until 07/21/22   Discharge labs: Hgb: 7.8 K+: 5.2  Ca: 9.2  Phos: 4.2 Alb: 2.6  ONSP - YES  Please draw routine labs. Additional labs needed: monthly labs 1st HD  Additional notes/follow-up: Advise patient to stop calcitriol at home

## 2022-06-29 NOTE — TOC Progression Note (Addendum)
Transition of Care [facility]) - Initial/Assessment Note    Patient Details  Name: Barbara Donovan MRN: [id number] Date of Birth: 27-Oct-1990  [facility]) CM/SW Contact:    Ralene Bathe, LCSW Phone Number: 06/29/2022, 4:21 PM  Clinical Narrative:                 LCSW contacted wound care clinic after being notified by RN of patient's request.  LCSW spoke with Jania in referrals and was informed that the clinic does not have any appointments available until the second week and July and would be unable to do daily dressing changes.  RNCM spoke with patient who is now agreeable for family to do dressing changes.    TOC following.   Expected Discharge Plan: Home/Self Care Barriers to Discharge: Continued Medical Work up   Patient Goals and CMS Choice Patient states their goals for this hospitalization and ongoing recovery are:: To return home CMS Medicare.gov Compare Post Acute Care list provided to:: Patient Choice offered to / list presented to : NA      Expected Discharge Plan and Services   Discharge Planning Services: CM Consult Post Acute Care Choice: NA Living arrangements for the past 2 months: Apartment Expected Discharge Date: 06/29/22               DME Arranged: N/A DME Agency: NA       HH Arranged: NA HH Agency: NA        Prior Living Arrangements/Services Living arrangements for the past 2 months: Apartment Lives with:: Significant Other, Minor Children, Other (Comment) (Son) Patient language and need for interpreter reviewed:: Yes Do you feel safe going back to the place where you live?: Yes      Need for Family Participation in Patient Care: Yes (Comment) Care giver support system in place?: Yes (comment)   Criminal Activity/Legal Involvement Pertinent to Current Situation/Hospitalization: No - Comment as needed  Activities of Daily Living Home Assistive Devices/Equipment: None ADL Screening (condition at time of admission) Patient's  cognitive ability adequate to safely complete daily activities?: Yes Is the patient deaf or have difficulty hearing?: No Does the patient have difficulty seeing, even when wearing glasses/contacts?: No Does the patient have difficulty concentrating, remembering, or making decisions?: No Patient able to express need for assistance with ADLs?: Yes Does the patient have difficulty dressing or bathing?: No Independently performs ADLs?: Yes (appropriate for developmental age) Does the patient have difficulty walking or climbing stairs?: No Weakness of Legs: None Weakness of Arms/Hands: None  Permission Sought/Granted Permission sought to share information with : Case Manager, Family Supports Permission granted to share information with : Yes, Verbal Permission Granted              Emotional Assessment Appearance:: Appears stated age Attitude/Demeanor/Rapport: Engaged, Gracious Affect (typically observed): Accepting, Appropriate, Calm, Hopeful, Pleasant Orientation: : Oriented to Self, Oriented to Place, Oriented to  Time, Oriented to Situation Alcohol / Substance Use: Not Applicable Psych Involvement: No (comment)  Admission diagnosis:  Infection of arteriovenous fistula, initial encounter (HCC) [O13.7XXA] Sepsis, due to unspecified organism, unspecified whether acute organ dysfunction present (HCC) [A41.9] 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) [T82.7XXA] Patient Active Problem List   Diagnosis Date Noted   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/2022   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) 10/13/2021   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) 10/13/2021   Greater trochanteric pain syndrome of right  lower extremity 07/23/2021   MSSA bacteremia 07/04/2021   Hyperlipidemia 04/20/2021   Anxiety and depression 02/04/2021   DKA, type 1 (HCC) 11/10/2020   Type 1 diabetes mellitus with hyperglycemia (HCC) 11/10/2020   ESRD on  hemodialysis (HCC) 11/10/2020   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 AT [address]NER OF [facility] [address] [place] [address] Phone: [phone] Fax: [phone]  [facility] [address] [place] [address] Phone: [phone] Fax: [phone]     Social Determinants of Health (SDOH) Social History: SDOH Screenings   Food Insecurity: No Food Insecurity (06/22/2022)  Housing: Low Risk  (06/22/2022)  Transportation Needs: No Transportation Needs (06/22/2022)  Utilities: Not At Risk (06/22/2022)  Depression (PHQ2-9): Low Risk  (07/23/2021)  Physical Activity: Inactive (03/22/2019)  Stress: No Stress Concern Present (03/22/2019)  Tobacco Use: Low Risk  (06/29/2022)   SDOH Interventions: Transportation Interventions: Intervention Not Indicated, Inpatient TOC, Patient Resources (Friends/Family)   Readmission Risk Interventions    06/09/2020    1:04 PM  Readmission Risk Prevention Plan  Transportation Screening Complete  PCP or Specialist appointment within 3-5 days of discharge Complete  HRI or Home Care Consult Complete  SW Recovery Care/Counseling Consult Complete  Palliative Care Screening Not Applicable  Skilled Nursing Facility Not Applicable

## 2022-06-29 NOTE — Progress Notes (Signed)
      TDC placed 06/28/22 post removal infected left arm graft status post excision.  Left arm dressing changed at bedside.     Wet to dry dressing replaced    Pending future plans for  permanent cess per MD  She has no evidence of bacteremia on blood cultures from 6/7 and 6/9.  AVG graft infection and bacteremia, due to MSSA.  ID Dr. Van Dam : 6 weeks of ancef with HD regardless thru July 18th, 2024  Pending TEE today and HD use of TDC.    Dressing plan: please apply hydrogel to open wound in the mid upper arm, then cover with 2 x 2 soaked in normal saline, then cover with dry 4 x 4's, Kerlix, and 4 inch Ace.  Change daily.    She will need dressing training or HH RN for wound care at discharge.   Barbara Donovan PA-C      

## 2022-06-29 NOTE — TOC Transition Note (Signed)
Transition of Care [facility]) - CM/SW Discharge Note   Patient Details  Name: TAMANI DURNEY MRN: [id number] Date of Birth: Oct 12, 1990  [facility]) CM/SW Contact:  Tom-Johnson, Hershal Coria, RN Phone Number: 06/29/2022, 4:39 PM   Clinical Narrative:     Patient is scheduled for discharge today.  Readmission Risk Assessment done. Home health for wound care info,Outpatient f/u, hospital f/u and discharge instructions on AVS. Prescriptions sent to [facility] pharmacy and meds will be delivered to patient at bedside prior discharge. Boyfriend to transport at discharge.  No further TOC needs noted.       Final next level of care: Home w Home Health Services Barriers to Discharge: Barriers Resolved   Patient Goals and CMS Choice CMS Medicare.gov Compare Post Acute Care list provided to:: Patient Choice offered to / list presented to : Patient  Discharge Placement                  Patient to be transferred to facility by: Boyfriend Name of family member notified: [facility]    Discharge Plan and Services Additional resources added to the After Visit Summary for     Discharge Planning Services: CM Consult Post Acute Care Choice: NA          DME Arranged: N/A DME Agency: NA       HH Arranged: RN HH Agency: [facility] Date HH Agency Contacted: 06/29/22 Time HH Agency Contacted: 1620 Representative spoke with at [facility] Agency: Tresa Endo  Social Determinants of Health (SDOH) Interventions SDOH Screenings   Food Insecurity: No Food Insecurity (06/22/2022)  Housing: Low Risk  (06/22/2022)  Transportation Needs: No Transportation Needs (06/22/2022)  Utilities: Not At Risk (06/22/2022)  Depression (PHQ2-9): Low Risk  (07/23/2021)  Physical Activity: Inactive (03/22/2019)  Stress: No Stress Concern Present (03/22/2019)  Tobacco Use: Low Risk  (06/29/2022)     Readmission Risk Interventions    06/29/2022    4:32 PM 06/09/2020    1:04 PM  Readmission Risk Prevention  Plan  Transportation Screening Complete Complete  PCP or Specialist Appt within 5-7 Days Complete   Home Care Screening Complete   Medication Review (RN CM) Referral to Pharmacy   PCP or Specialist appointment within 3-5 days of discharge  Complete  HRI or Home Care Consult  Complete  SW Recovery Care/Counseling Consult  Complete  Palliative Care Screening  Not Applicable  Skilled Nursing Facility  Not Applicable

## 2022-06-29 NOTE — Progress Notes (Signed)
[facility]    Date of Admission:  06/22/2022   Total days of antibiotics 8   ID: Barbara Donovan is a 32 y.o. 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  Subjective: Afebrile. Underwent TEE this morning that ruled out vegetations.   Yesterday had tunneled HD placed about left IJ. Sore at area but no surrounding erythema or bleeding.  Medications:   acetaminophen  1,000 mg Oral Q8H   amLODipine  10 mg Oral QHS   calcitRIOL  0.5 mcg Oral Daily   carvedilol  25 mg Oral BID WC   Chlorhexidine Gluconate Cloth  6 each Topical Q0600   cinacalcet  30 mg Oral Q supper   darbepoetin (ARANESP) injection - DIALYSIS  150 mcg Subcutaneous Q Tue-1800   insulin aspart  0-6 Units Subcutaneous TID WC   insulin glargine-yfgn  9 Units Subcutaneous Daily   mirtazapine  15 mg Oral QHS   multivitamin  1 tablet Oral QHS   polyethylene glycol  17 g Oral Daily   senna-docusate  1 tablet Oral BID   sucroferric oxyhydroxide  1,500 mg Oral TID WC    Objective: Vital signs in last 24 hours: Temp:  [97.1 F (36.2 C)-98.6 F (37 C)] 98 F (36.7 C) (06/11 0927) Pulse Rate:  [72-91] 84 (06/11 0927) Resp:  [10-18] 18 (06/11 0927) BP: (123-180)/(54-137) 154/85 (06/11 0927) SpO2:  [98 %-100 %] 100 % (06/11 0927) Weight:  [60.2 kg] 60.2 kg (06/11 0500)  Physical Exam  Constitutional:  oriented to person, place, and time. appears well-developed and well-nourished. No distress.  HENT: [place]/AT, PERRLA, no scleral icterus Mouth/Throat: Oropharynx is clear and moist. No oropharyngeal exudate.  Cardiovascular: Normal rate, regular rhythm and normal heart sounds. Exam reveals no gallop and no friction rub.  No murmur heard.  Pulmonary/Chest: Effort normal and breath sounds normal. No respiratory distress.  has no wheezes.   Chest wall: new left sided tunneled HD catheter, dressing in place Neck = supple, no nuchal rigidity Abdominal: Soft. Bowel sounds are normal.  exhibits no distension. There is no tenderness.  Lymphadenopathy: no cervical adenopathy. No axillary adenopathy Neurological: alert and oriented to person, place, and time.  Skin: Skin is warm and dry. No rash noted. No erythema.  Psychiatric: a normal mood and affect.  behavior is normal.    Lab Results Recent Labs    06/28/22 0325 06/29/22 0326  WBC 9.2 8.7  HGB 7.8* 7.8*  HCT 23.7* 24.5*  NA 128* 133*  K 4.8 4.0  CL 87* 89*  CO2 23 25  BUN 91* 45*  CREATININE 15.73* 9.08*   Liver Panel Recent Labs    06/28/22 0325 06/29/22 0326  ALBUMIN 2.6* 2.6*   Sedimentation Rate No results for input(s): "ESRSEDRATE" in the last 72 hours. C-Reactive Protein No results for input(s): "CRP" in the last 72 hours.  Microbiology: 6/7 blood cx ngtd 6/5 blood cx ngtd 6/4 blood cx + MSSA Studies/Results: ECHO TEE  Result Date: 06/29/2022    TRANSESOPHOGEAL ECHO REPORT   Patient Name:   Barbara Donovan Date of Exam: 06/29/2022 Medical Rec #:  [id number]        Height:       66.0 in Accession #:    [id number]       Weight:  132.7 lb Date of Birth:  1990/06/25        BSA:          1.680 m Patient Age:    31 years         BP:           160/85 mmHg Patient Gender: F                HR:           87 bpm. Exam Location:  Inpatient Procedure: Transesophageal Echo Indications:    SBE  History:        Patient has prior history of Echocardiogram examinations.                 Endocarditis.  Sonographer:    [facility] Referring Phys: [id number] ANGELA NICOLE DUKE PROCEDURE: The transesophogeal probe was passed without difficulty through the esophogus of the patient. Sedation performed by different physician. The patient's vital signs; including heart rate, blood pressure, and oxygen saturation; remained stable throughout the procedure. The patient developed no  complications during the procedure.  IMPRESSIONS  1. Left ventricular ejection fraction, by estimation, is 60 to 65%. The left ventricle has normal function. The left ventricle has no regional wall motion abnormalities.  2. Right ventricular systolic function is normal. The right ventricular size is normal.  3. Left atrial size was mildly dilated. No left atrial/left atrial appendage thrombus was detected.  4. The mitral valve is normal in structure. Trivial mitral valve regurgitation. No evidence of mitral stenosis.  5. The aortic valve is tricuspid. Aortic valve regurgitation is not visualized. No aortic stenosis is present.  6. The inferior vena cava is normal in size with greater than 50% respiratory variability, suggesting right atrial pressure of 3 mmHg. Conclusion(s)/Recommendation(s): Normal biventricular function without evidence of hemodynamically significant valvular heart disease. FINDINGS  Left Ventricle: Left ventricular ejection fraction, by estimation, is 60 to 65%. The left ventricle has normal function. The left ventricle has no regional wall motion abnormalities. The left ventricular internal cavity size was normal in size. There is  no left ventricular hypertrophy. Right Ventricle: The right ventricular size is normal. No increase in right ventricular wall thickness. Right ventricular systolic function is normal. Left Atrium: Left atrial size was mildly dilated. No left atrial/left atrial appendage thrombus was detected. Right Atrium: Right atrial size was normal in size. Pericardium: Trivial pericardial effusion is present. The pericardial effusion is anterior to the right ventricle. Mitral Valve: The mitral valve is normal in structure. Trivial mitral valve regurgitation. No evidence of mitral valve stenosis. There is no evidence of mitral valve vegetation. Tricuspid Valve: The tricuspid valve is normal in structure. Tricuspid valve regurgitation is mild . No evidence of tricuspid stenosis.  There is no evidence of tricuspid valve vegetation. Aortic Valve: The aortic valve is tricuspid. Aortic valve regurgitation is not visualized. No aortic stenosis is present. There is no evidence of aortic valve vegetation. Pulmonic Valve: The pulmonic valve was normal in structure. Pulmonic valve regurgitation is trivial. No evidence of pulmonic stenosis. There is no evidence of pulmonic valve vegetation. Aorta: The aortic root is normal in size and structure. Venous: The inferior vena cava is normal in size with greater than 50% respiratory variability, suggesting right atrial pressure of 3 mmHg. IAS/Shunts: No atrial level shunt detected by color flow Doppler. Charlton Haws MD Electronically signed by Charlton Haws MD Signature Date/Time: 06/29/2022/8:50:22 AM    Final    EP STUDY  Result Date:  06/29/2022 See surgical note for result.  DG Chest 1 View  Result Date: 06/28/2022 CLINICAL DATA:  Dialysis catheter placement EXAM: CHEST  1 VIEW COMPARISON:  06/26/2022 FINDINGS: Tunneled left IJ hemodialysis catheter extends to the proximal right atrium. No pneumothorax. Relatively low lung volumes with some left retrocardiac consolidation/atelectasis. Heart size and mediastinal contours are within normal limits. No effusion. Visualized bones unremarkable. IMPRESSION: Tunneled left IJ hemodialysis catheter to the proximal right atrium. No pneumothorax. Electronically Signed   By: Corlis Leak M.D.   On: 06/28/2022 10:03   HYBRID OR IMAGING ([facility] ONLY)  Result Date: 06/28/2022 There is no interpretation for this exam.  This order is for images obtained during a surgical procedure.  Please See "Surgeries" Tab for more information regarding the procedure.     Assessment/Plan: 32yo F with MSSA AVG infection s/p removal on 6/5, cleared bacteremia, plan for 4 wks of  Cefazolin 2g/HD-MWF with end date of 07/21/22 (4 weeks from 06/23/22)  We will see back in the ID clinic for follow up.  I have personally spent 50  minutes involved in face-to-face and non-face-to-face activities for this patient on the day of the visit. Professional time spent includes the following activities: Preparing to see the patient (review of tests), Obtaining and/or reviewing separately obtained history (admission/discharge record), Performing a medically appropriate examination and/or evaluation , Ordering medications/tests/procedures, referring and communicating with other health care professionals, Documenting clinical information in the EMR, Independently interpreting results (not separately reported), Communicating results to the patient/family/caregiver, Counseling and educating the patient/family/caregiver and Care coordination (not separately reported).     [facility] for Infectious Diseases Pager: [phone]  06/29/2022, 10:24 AM

## 2022-06-29 NOTE — Progress Notes (Signed)
Received patient in bed to unit.  Alert and oriented. X 4 Informed consent signed and in chart. yes  TX duration:3.5 hours  Patient tolerated well. yes Transported back to the room yes Alert, without acute distress. Cao x4 no distress Hand-off given to patient's nurse. Phineas Inches RN  Access used: left permanent catheter. Access issues: none  Total UF removed: Medication(s) given: heparin 4000units  Post HD VS: 125/93 Hr 99 temp 98.1 Post HD weight: 53.8Kg   [facility]

## 2022-06-29 NOTE — Transfer of Care (Signed)
Immediate Anesthesia Transfer of Care Note  Patient: Barbara Donovan  Procedure(s) Performed: TRANSESOPHAGEAL ECHOCARDIOGRAM  Patient Location: cath lab  Anesthesia Type:MAC  Level of Consciousness: awake  Airway & Oxygen Therapy: Patient Spontanous Breathing  Post-op Assessment: Report given to RN and Post -op Vital signs reviewed and stable  Post vital signs: Reviewed and stable  Last Vitals:  Vitals Value Taken Time  BP    Temp    Pulse    Resp    SpO2      Last Pain:  Vitals:   06/29/22 0740  TempSrc: Temporal  PainSc: 0-No pain      Patients Stated Pain Goal: 2 (06/29/22 0127)  Complications: No notable events documented.

## 2022-06-29 NOTE — Interval H&P Note (Signed)
History and Physical Interval Note:  06/29/2022 7:44 AM  Barbara Donovan  has presented today for surgery, with the diagnosis of BACTEREMIA.  The various methods of treatment have been discussed with the patient and family. After consideration of risks, benefits and other options for treatment, the patient has consented to  Procedure(s): TRANSESOPHAGEAL ECHOCARDIOGRAM (N/A) as a surgical intervention.  The patient's history has been reviewed, patient examined, no change in status, stable for surgery.  I have reviewed the patient's chart and labs.  Questions were answered to the patient's satisfaction.     Charlton Haws

## 2022-06-29 NOTE — Progress Notes (Signed)
0.5mL dilaudid IV & 2mg  PO Dilaudid tablet wasted in stericycle.  Witnessed by Cathleen Fears RN

## 2022-06-29 NOTE — Discharge Instructions (Addendum)
Barbara Donovan,  It was a pleasure taking care of you at [facility]. You were admitted for an infected dialysis graft and treated for a bacterial infection that spread to your blood. We are discharging you home now that you are doing better. Please follow the following instructions.   1) Please apply hydrogel to open wound in the mid upper arm, then cover with 2 x 2 soaked in normal saline, then cover with dry 4 x 4's, Kerlix, and 4 inch Ace.  Change daily.    2) Please attend dialysis starting this Wednesday 06/30/22. Arrive by 11:15 to complete paperwork prior to treatment. Location is [facility] on MWF 11:45 am chair time.  3) Continue IV antibiotics during MWF dialysis until 07/21/22 and follow up with infectious disease on 07/26/22.   Take care,  Annette Stable, Medical Student Dr. Rocky Morel, DO

## 2022-06-29 NOTE — Discharge Summary (Signed)
Name: Barbara Donovan MRN: [id number] DOB: 11/15/1990 31 y.o. PCP: [facility]  Date of Admission: 06/22/2022  7:29 AM Date of Discharge: 06/29/2022 Attending Physician: Inez Catalina, MD  Discharge Diagnosis: 1. Principal Problem:   Infection of AV graft for dialysis [facility]) Active Problems:   MSSA bacteremia   Discharge Medications: Allergies as of 06/29/2022   No Known Allergies      Medication List     TAKE these medications    acetaminophen 325 MG tablet Commonly known as: TYLENOL Take 650 mg by mouth every 6 (six) hours as needed for mild pain.   amLODipine 10 MG tablet Commonly known as: NORVASC Take 1 tablet (10 mg total) by mouth daily. What changed: when to take this   calcitRIOL 0.5 MCG capsule Commonly known as: ROCALTROL Take 0.5 mcg by mouth every Monday, Wednesday, and Friday with hemodialysis.   carvedilol 25 MG tablet Commonly known as: COREG Take 1 tablet (25 mg total) by mouth 2 (two) times daily.   ceFAZolin  IVPB Commonly known as: ANCEF Inject 2 g into the vein every Monday, Wednesday, and Friday with hemodialysis for 21 days. Indication:  MSSA bacteremia/AVG infection First Dose: Yes Last Day of Therapy:  07/21/22 Labs - Once weekly:  CBC/D and BMP, ESR and CRP Method of administration: Per hemodialysis protocol at the HD center Method of administration may be changed at the discretion of home infusion pharmacist based upon assessment of the patient and/or caregiver's ability to self-administer the medication ordered. Start taking on: June 30, 2022   Chlorhexidine Gluconate Cloth 2 % Pads Apply 6 each topically daily at 6 (six) AM. Start taking on: June 30, 2022   Continuous Blood Gluc Receiver Devi 1 Units by Does not apply route 4 (four) times daily -  before meals and at bedtime. May substitute for cheapest monitor with insurance   Dexcom G7 Sensor Misc 1 Device by Does not apply route as directed.   ethyl chloride  spray Apply 1 Application topically every Monday, Wednesday, and Friday with hemodialysis. Apply to arm   FREESTYLE TEST STRIPS test strip Generic drug: glucose blood Use as instructed   hydrALAZINE 50 MG tablet Commonly known as: APRESOLINE Take 1 tablet by mouth twice a day, may take 1 extra tablet by mouth daily only as needed for elevated blood pressure What changed:  how much to take how to take this when to take this additional instructions   insulin lispro 100 UNIT/ML KwikPen Commonly known as: HumaLOG KwikPen Max daily 35 units What changed:  how much to take when to take this   Insulin Pen Needle 32G X 4 MM Misc 1 Device by Does not apply route in the morning, at noon, in the evening, and at bedtime.   Lantus SoloStar 100 UNIT/ML Solostar Pen Generic drug: insulin glargine Inject 18 Units into the skin every morning. What changed: how much to take   metoCLOPramide 10 MG tablet Commonly known as: REGLAN Take 1 tablet (10 mg total) by mouth every 6 (six) hours. What changed:  when to take this reasons to take this   mirtazapine 15 MG tablet Commonly known as: REMERON Take 15 mg by mouth at bedtime.   multivitamin Tabs tablet Take 1 tablet by mouth at bedtime.   ondansetron 8 MG disintegrating tablet Commonly known as: ZOFRAN-ODT Take 1 tablet (8 mg total) by mouth every 8 (eight) hours as needed for nausea or vomiting.   Sensipar 30 MG tablet  Generic drug: cinacalcet Take 30 mg by mouth daily.   Velphoro 500 MG chewable tablet Generic drug: sucroferric oxyhydroxide Chew 1,500 mg by mouth 3 (three) times daily with meals.               Home Infusion Instuctions  (From admission, onward)           Start     Ordered   06/29/22 0000  Home infusion instructions       Question:  Instructions  Answer:  Flushing of vascular access device: 0.9% NaCl pre/post medication administration and prn patency; Heparin 100 u/ml, 5ml for implanted ports and  Heparin 10u/ml, 5ml for all other central venous catheters.   06/29/22 1002              Discharge Care Instructions  (From admission, onward)           Start     Ordered   06/29/22 0000  Discharge wound care:       Comments: please apply hydrogel to open wound in the mid upper arm, then cover with 2 x 2 soaked in normal saline, then cover with dry 4 x 4's, Kerlix, and 4 inch Ace.  Change daily.   06/29/22 1619            Disposition and follow-up:   [name] was discharged from [facility] in Good condition.  At the hospital follow up visit please address:  1.   MSSA bacteremia, LUE AV graft infection: Continue 2 grams iv cefazolin with HD only on MWF until 07/21/22 (4 weeks from 06/23/22).  Follow up appointment with ID on 07/26/22 at 2PM with Dr. Elinor Parkinson. Continue to change open wound in LUE daily.   ESRD on HD at home Uremia, AGMA, hyperkalemia, hyponatremia Anemia of chronic disease Will start HD at [facility] on MWF 11:45 am on 06/30/22.   2.  Labs / imaging needed at time of follow-up: None  3.  Pending labs/ test needing follow-up: BMP  Follow-up Appointments:  Follow-up Information     Center, [facility]. Go on 06/30/2022.   Why: Schedule is Monday,Wednesday,Friday with 11:45 am chair time.  On Wednesday (6/12), please arrive at 11:15 to complete paperwork prior to treatment. Contact information: [address] [place] [address] [phone]         Adron Bene, MD Follow up on 07/06/2022.   Why: at 9:15 AM, please arrive 15-30 minutes early Contact information: [address] [place] [address] [phone]                Barbara Donovan is a 32 y.o. female with pertinent PMH of ESRD on HD at home, T1DM, and HTN who presented with an infected left upper extremity AV graft and is admitted for sepsis 2/2 MSSA bacteremia, on hospital day 5.  Hospital Course by problem list: 1.  MSSA bacteremia,  LUE AV graft infection Patient presented septic with MSSA bacteremia 2/2 infected AV graft.  Underwent extraction and drain placement with vascular surgery on admission.  During this procedure a left IJ temporary hemodialysis catheter was placed.  Was continued on cefazolin throughout admission and had a line holiday between 6/6 and 6/10.  Cultures both before and after removal of temporary HD catheter remain negative and a tunneled dialysis catheter was placed in left IJ on 6/10.  Tolerated dialysis well on 6/10.  Underwent TEE on 6/11 without any significant abnormalities or vegetations.  Discharged  with wound care instructions, vascular surgery follow-up, and transition to in center dialysis.  ESRD on HD at home Uremia, AGMA, hyperkalemia, hyponatremia Anemia of chronic disease Patient's last dialysis was 5 days prior to admission. Presented with AGMA most likely due to uremia. K at 5.7, Na at 127, Bicarb at 16, BUN at 135, Cr at 19.02. eGFR at 2. Anion gap 26. Completed dialysis session with temporary catheter on 06/24/2022.  Started on a line holiday after that from 6/6 to 6/10.  Had a left IJ tunneled dialysis catheter placed on 6/10 followed by HD with 3L UF removed. BMP shows Na at 133, K at 4, Cr at 9.08, and BUN at 45 after HD.   T1DM CBGs ranged from 307-87. Started on insulin glargine 9 units daily, which is half her home dose and sliding scale insulin. Patient had poor appetite early in admission, but should restart home glargine dose when appetite resumes.     Hypertension Patient was hypertensive at 186/97 on admission. Was stabilized after AV graft removal but remained hypertensive during admission with SBP ranging in 170-120s and DBP in 130-50s. Pt was started on amlodipine 10 mg daily and resumed home carvedilol 25 mg twice daily, but BP remains elevated.    Right upper lobe lung nodule Admission CXR showed new nodular opacity in the right upper lobe, which may be infectious,  inflammatory or neoplastic. Repeat CXR 4 days later showed resolution. No further follow-up.  Discharge Subjective: Doing well after TEE today. No new or worsening complaints. She is excited to get home and understands when and where her outpatient dialysis is.  Discharge Exam:   BP (!) 164/90 (BP Location: Right Arm)   Pulse 79   Temp 98.1 F (36.7 C) (Oral)   Resp 18   Ht 5\' 6"  (1.676 m)   Wt 60.2 kg   SpO2 100%   BMI 21.42 kg/m  Discharge exam:  Constitutional: Tired appearing adult female, in no acute distress HENT: normocephalic atraumatic, mucous membranes moist Eyes: conjunctiva non-erythematous Neck: supple, incision and bandage from left IJ tunneled dialysis catheter without signs of infection or drainage Pulmonary/Chest: normal work of breathing on room air Abdominal: soft, non-tender, non-distended, positive bowel sounds MSK: normal bulk and tone, left arm from the shoulder to the hand is wrapped in Ace bandages and gauze.  Tenderness to palpation of the left bicep where her AV graft was Neurological: alert & oriented x 3, No focal deficits noted Skin: warm and dry  Pertinent Labs, Studies, and Procedures:     Latest Ref Rng & Units 06/29/2022   10:36 AM 06/29/2022    3:26 AM 06/28/2022    3:25 AM  CMP  Glucose 70 - 99 mg/dL 409  811  914   BUN 6 - 20 mg/dL 53  45  91   Creatinine 0.44 - 1.00 mg/dL 78.29  5.62  13.08   Sodium 135 - 145 mmol/L 134  133  128   Potassium 3.5 - 5.1 mmol/L 5.2  4.0  4.8   Chloride 98 - 111 mmol/L 95  89  87   CO2 22 - 32 mmol/L 22  25  23    Calcium 8.9 - 10.3 mg/dL 9.2  9.2  9.0        Latest Ref Rng & Units 06/29/2022    3:26 AM 06/28/2022    3:25 AM 06/27/2022    1:54 AM  CBC  WBC 4.0 - 10.5 K/uL 8.7  9.2  9.7  Hemoglobin 12.0 - 15.0 g/dL 7.8  7.8  7.8   Hematocrit 36.0 - 46.0 % 24.5  23.7  24.1   Platelets 150 - 400 K/uL 579  552  573     ECHO TEE  Result Date: 06/29/2022    TRANSESOPHOGEAL ECHO REPORT   Patient Name:    Barbara Donovan Date of Exam: 06/29/2022 Medical Rec #:  [id number]        Height:       66.0 in Accession #:    [id number]       Weight:       132.7 lb Date of Birth:  December 25, 1990        BSA:          1.680 m Patient Age:    31 years         BP:           160/85 mmHg Patient Gender: F                HR:           87 bpm. Exam Location:  Inpatient Procedure: Transesophageal Echo Indications:    SBE  History:        Patient has prior history of Echocardiogram examinations.                 Endocarditis.  Sonographer:    [facility] Referring Phys: [id number] ANGELA NICOLE DUKE PROCEDURE: The transesophogeal probe was passed without difficulty through the esophogus of the patient. Sedation performed by different physician. The patient's vital signs; including heart rate, blood pressure, and oxygen saturation; remained stable throughout the procedure. The patient developed no complications during the procedure.  IMPRESSIONS  1. Left ventricular ejection fraction, by estimation, is 60 to 65%. The left ventricle has normal function. The left ventricle has no regional wall motion abnormalities.  2. Right ventricular systolic function is normal. The right ventricular size is normal.  3. Left atrial size was mildly dilated. No left atrial/left atrial appendage thrombus was detected.  4. The mitral valve is normal in structure. Trivial mitral valve regurgitation. No evidence of mitral stenosis.  5. The aortic valve is tricuspid. Aortic valve regurgitation is not visualized. No aortic stenosis is present.  6. The inferior vena cava is normal in size with greater than 50% respiratory variability, suggesting right atrial pressure of 3 mmHg. Conclusion(s)/Recommendation(s): Normal biventricular function without evidence of hemodynamically significant valvular heart disease. FINDINGS  Left Ventricle: Left ventricular ejection fraction, by estimation, is 60 to 65%. The left ventricle has normal function. The left ventricle has no regional wall  motion abnormalities. The left ventricular internal cavity size was normal in size. There is  no left ventricular hypertrophy. Right Ventricle: The right ventricular size is normal. No increase in right ventricular wall thickness. Right ventricular systolic function is normal. Left Atrium: Left atrial size was mildly dilated. No left atrial/left atrial appendage thrombus was detected. Right Atrium: Right atrial size was normal in size. Pericardium: Trivial pericardial effusion is present. The pericardial effusion is anterior to the right ventricle. Mitral Valve: The mitral valve is normal in structure. Trivial mitral valve regurgitation. No evidence of mitral valve stenosis. There is no evidence of mitral valve vegetation. Tricuspid Valve: The tricuspid valve is normal in structure. Tricuspid valve regurgitation is mild . No evidence of tricuspid stenosis. There is no evidence of tricuspid valve vegetation. Aortic Valve: The aortic valve is tricuspid. Aortic valve regurgitation is not visualized. No aortic stenosis is  present. There is no evidence of aortic valve vegetation. Pulmonic Valve: The pulmonic valve was normal in structure. Pulmonic valve regurgitation is trivial. No evidence of pulmonic stenosis. There is no evidence of pulmonic valve vegetation. Aorta: The aortic root is normal in size and structure. Venous: The inferior vena cava is normal in size with greater than 50% respiratory variability, suggesting right atrial pressure of 3 mmHg. IAS/Shunts: No atrial level shunt detected by color flow Doppler. Charlton Haws MD Electronically signed by Charlton Haws MD Signature Date/Time: 06/29/2022/8:50:22 AM    Final    EP STUDY  Result Date: 06/29/2022 See surgical note for result.  DG Chest 1 View  Result Date: 06/28/2022 CLINICAL DATA:  Dialysis catheter placement EXAM: CHEST  1 VIEW COMPARISON:  06/26/2022 FINDINGS: Tunneled left IJ hemodialysis catheter extends to the proximal right atrium. No  pneumothorax. Relatively low lung volumes with some left retrocardiac consolidation/atelectasis. Heart size and mediastinal contours are within normal limits. No effusion. Visualized bones unremarkable. IMPRESSION: Tunneled left IJ hemodialysis catheter to the proximal right atrium. No pneumothorax. Electronically Signed   By: Corlis Leak M.D.   On: 06/28/2022 10:03   HYBRID OR IMAGING ([facility] ONLY)  Result Date: 06/28/2022 There is no interpretation for this exam.  This order is for images obtained during a surgical procedure.  Please See "Surgeries" Tab for more information regarding the procedure.     Discharge Instructions:   Discharge Instructions      Fiorela Izatt,  It was a pleasure taking care of you at [facility]. You were admitted for an infected dialysis graft and treated for a bacterial infection that spread to your blood. We are discharging you home now that you are doing better. Please follow the following instructions.   1) Please apply hydrogel to open wound in the mid upper arm, then cover with 2 x 2 soaked in normal saline, then cover with dry 4 x 4's, Kerlix, and 4 inch Ace.  Change daily.    2) Please attend dialysis starting this Wednesday 06/30/22. Arrive by 11:15 to complete paperwork prior to treatment. Location is [facility] on MWF 11:45 am chair time.  3) Continue IV antibiotics during MWF dialysis until 07/21/22 and follow up with infectious disease on 07/26/22.   Take care,  Annette Stable, Medical Student Dr. Rocky Morel, DO    Discharge Instructions     Call MD for:  difficulty breathing, headache or visual disturbances   Complete by: As directed    Call MD for:  extreme fatigue   Complete by: As directed    Call MD for:  persistant dizziness or light-headedness   Complete by: As directed    Call MD for:  persistant nausea and vomiting   Complete by: As directed    Call MD for:  redness, tenderness, or  signs of infection (pain, swelling, redness, odor or green/yellow discharge around incision site)   Complete by: As directed    Call MD for:  severe uncontrolled pain   Complete by: As directed    Call MD for:  temperature >100.4   Complete by: As directed    Diet - low sodium heart healthy   Complete by: As directed    Discharge wound care:   Complete by: As directed    please apply hydrogel to open wound in the mid upper arm, then cover with 2 x 2 soaked in normal saline, then cover with dry 4 x 4's,  Kerlix, and 4 inch Ace.  Change daily.   Home infusion instructions   Complete by: As directed    Instructions: Flushing of vascular access device: 0.9% NaCl pre/post medication administration and prn patency; Heparin 100 u/ml, 5ml for implanted ports and Heparin 10u/ml, 5ml for all other central venous catheters.   Increase activity slowly   Complete by: As directed       Signed: Rocky Morel, DO 06/29/2022, 4:20 PM   Pager:[phone]

## 2022-06-29 NOTE — H&P (View-Only) (Signed)
      TDC placed 06/28/22 post removal infected left arm graft status post excision.  Left arm dressing changed at bedside.     Wet to dry dressing replaced    Pending future plans for  permanent cess per MD  She has no evidence of bacteremia on blood cultures from 6/7 and 6/9.  AVG graft infection and bacteremia, due to MSSA.  ID Dr. Daiva Eves : 6 weeks of ancef with HD regardless thru July 18th, 2024  Pending TEE today and HD use of TDC.    Dressing plan: please apply hydrogel to open wound in the mid upper arm, then cover with 2 x 2 soaked in normal saline, then cover with dry 4 x 4's, Kerlix, and 4 inch Ace.  Change daily.    She will need dressing training or HH RN for wound care at discharge.   Mosetta Pigeon PA-C

## 2022-06-29 NOTE — Progress Notes (Addendum)
Provided pt a new schedule letter for out-pt HD arrangements due to previous one being misplaced. Left in pt's room with pt. Per attending, pt is for possible d/c later today if everything is in place for wound care at d/c. Contacted [facility] and spoke to [place]. Clinic has pt on the schedule for tomorrow if pt d/c today. Renal PA to send orders to clinic for HD and iv abx at d/c. Charge RN at clinic confirmed yesterday that clinic has iv abx available. Will assist as needed.   Olivia Canter Renal Navigator [phone]  Addendum at 4:31 pm: Pt to d/c this afternoon. Contacted [facility] to confirm that pt will d/c today and start tomorrow. Out-pt HD arrangements on pt's AVS.

## 2022-06-29 NOTE — Progress Notes (Signed)
   06/29/22 0024  Vitals  Temp 98.1 F (36.7 C)  Temp Source Oral  BP 133/82  BP Location Right Arm  BP Method Automatic  Patient Position (if appropriate) Lying  Pulse Rate 88  Pulse Rate Source Monitor  Resp 17  Oxygen Therapy  SpO2 100 %  O2 Device Room Air  During Treatment Monitoring  Intra-Hemodialysis Comments Tx completed

## 2022-06-29 NOTE — Anesthesia Preprocedure Evaluation (Addendum)
Anesthesia Evaluation  Patient identified by MRN, date of birth, ID band Patient awake    Reviewed: Allergy & Precautions, NPO status , Patient's Chart, lab work & pertinent test results, reviewed documented beta blocker date and time   History of Anesthesia Complications Negative for: history of anesthetic complications  Airway Mallampati: III  TM Distance: >3 FB Neck ROM: Full    Dental  (+) Dental Advisory Given   Pulmonary neg pulmonary ROS   breath sounds clear to auscultation       Cardiovascular hypertension, Pt. on medications and Pt. on home beta blockers (-) angina  Rhythm:Regular Rate:Normal  06/23/2022 ECHO: EF 55 to 60%.  1. The LV has normal function, no regional wall motion abnormalities. There is mild concentric LVH. Left ventricular diastolic parameters were normal.   2. Right ventricular systolic function is normal. The right ventricular size is normal.   3. The mitral valve is normal in structure. Trivial MR. No evidence of mitral stenosis.   4. The aortic valve is normal in structure. AI is not visualized. No aortic stenosis is present.     Neuro/Psych   Anxiety Depression    negative neurological ROS     GI/Hepatic Neg liver ROS,GERD  Controlled,,  Endo/Other  diabetes (glu 232), Insulin Dependent    Renal/GU Dialysis and ESRFRenal disease (K+ 4.0)     Musculoskeletal   Abdominal   Peds  Hematology  (+) Blood dyscrasia (Hb 7.8, plt 579k), anemia   Anesthesia Other Findings   Reproductive/Obstetrics                             Anesthesia Physical Anesthesia Plan  ASA: 3  Anesthesia Plan: MAC   Post-op Pain Management: Minimal or no pain anticipated   Induction:   PONV Risk Score and Plan: 2 and Treatment may vary due to age or medical condition  Airway Management Planned: Natural Airway and Nasal Cannula  Additional Equipment: None  Intra-op Plan:    Post-operative Plan:   Informed Consent: I have reviewed the patients History and Physical, chart, labs and discussed the procedure including the risks, benefits and alternatives for the proposed anesthesia with the patient or authorized representative who has indicated his/her understanding and acceptance.     Dental advisory given  Plan Discussed with: CRNA and Surgeon  Anesthesia Plan Comments:        Anesthesia Quick Evaluation

## 2022-06-29 NOTE — CV Procedure (Signed)
TEE Propofol Dr Jean Rosenthal  No SBE No vegetations Trivial MR Tri leaflet AV Mild TR Mild PR Trivial pericardial effusion over RV No ASD/PFO EF 65%  Barbara Haws MD [facility]

## 2022-06-30 ENCOUNTER — Telehealth: Payer: Self-pay | Admitting: Nephrology

## 2022-06-30 ENCOUNTER — Encounter (HOSPITAL_COMMUNITY): Payer: Self-pay | Admitting: Cardiovascular Disease

## 2022-06-30 LAB — CULTURE, BLOOD (ROUTINE X 2)
Culture: NO GROWTH
Special Requests: ADEQUATE

## 2022-06-30 NOTE — Telephone Encounter (Signed)
Transition of Care Contact from Inpatient Facility  Date of Discharge: 06/29/22 Date of Contact: 06/30/22 Method of contact: phone - attempted  Attempted to contact patient to discuss transition of care from inpatient admission.  Patient did not answer the phone.  Will attempt to call them again and if unable to reach will follow up at dialysis.  Virgina Norfolk, PA-C [employer]

## 2022-07-06 ENCOUNTER — Telehealth: Payer: Self-pay | Admitting: *Deleted

## 2022-07-06 ENCOUNTER — Ambulatory Visit (INDEPENDENT_AMBULATORY_CARE_PROVIDER_SITE_OTHER): Payer: 59

## 2022-07-06 ENCOUNTER — Other Ambulatory Visit: Payer: Self-pay

## 2022-07-06 VITALS — BP 160/83 | HR 78 | Temp 98.0°F | Ht 66.0 in | Wt 153.8 lb

## 2022-07-06 DIAGNOSIS — T827XXA Infection and inflammatory reaction due to other cardiac and vascular devices, implants and grafts, initial encounter: Secondary | ICD-10-CM

## 2022-07-06 DIAGNOSIS — Z0000 Encounter for general adult medical examination without abnormal findings: Secondary | ICD-10-CM

## 2022-07-06 DIAGNOSIS — E10319 Type 1 diabetes mellitus with unspecified diabetic retinopathy without macular edema: Secondary | ICD-10-CM | POA: Diagnosis not present

## 2022-07-06 DIAGNOSIS — I10 Essential (primary) hypertension: Secondary | ICD-10-CM

## 2022-07-06 DIAGNOSIS — E1065 Type 1 diabetes mellitus with hyperglycemia: Secondary | ICD-10-CM

## 2022-07-06 MED ORDER — INSULIN LISPRO (1 UNIT DIAL) 100 UNIT/ML (KWIKPEN): 6.0000 [IU] | PEN_INJECTOR | Freq: Two times a day (BID) | SUBCUTANEOUS | 3 refills | Status: DC

## 2022-07-06 NOTE — Progress Notes (Signed)
Subjective:   Barbara Donovan is a 32 y.o. female who presents for an Initial Medicare Annual Wellness Visit.  Visit Complete: In person  Patient Medicare AWV questionnaire was completed by the patient on 07/06/2022; I have confirmed that all information answered by patient is correct and no changes since this date.  Review of Systems    Defer to PCP       Objective:    Today's Vitals   07/06/22 1303 07/06/22 1304  BP: (!) 160/83   Pulse: 78   Temp: 98 F (36.7 C)   TempSrc: Oral   SpO2: 100%   Weight: 153 lb 12.8 oz (69.8 kg)   Height: 5\' 6"  (1.676 m)   PainSc:  0-No pain   Body mass index is 24.82 kg/m.     07/06/2022    1:06 PM 07/06/2022    9:26 AM 06/22/2022    7:37 AM 12/05/2021    3:32 PM 07/31/2021   12:45 AM 07/28/2021    4:18 PM 07/25/2021    8:29 AM  Advanced Directives  Does Patient Have a Medical Advance Directive? No Yes Yes No No No No  Type of Estate agent of [place];Living will Living will;Healthcare Power of Attorney Living will;Healthcare Power of Attorney      Does patient want to make changes to medical advance directive?   No - Patient declined      Copy of Healthcare Power of Attorney in Chart? No - copy requested No - copy requested No - copy requested, Physician notified      Would patient like information on creating a medical advance directive?     No - Patient declined No - Patient declined No - Patient declined    Current Medications (verified) Outpatient Encounter Medications as of 07/06/2022  Medication Sig   acetaminophen (TYLENOL) 325 MG tablet Take 650 mg by mouth every 6 (six) hours as needed for mild pain.   amLODipine (NORVASC) 10 MG tablet Take 1 tablet (10 mg total) by mouth daily. (Patient taking differently: Take 10 mg by mouth every evening.)   calcitRIOL (ROCALTROL) 0.5 MCG capsule Take 0.5 mcg by mouth every Monday, Wednesday, and Friday with hemodialysis.   carvedilol (COREG) 25 MG tablet Take 1 tablet  (25 mg total) by mouth 2 (two) times daily.   ceFAZolin (ANCEF) IVPB Inject 2 g into the vein every Monday, Wednesday, and Friday with hemodialysis for 21 days. Indication:  MSSA bacteremia/AVG infection First Dose: Yes Last Day of Therapy:  07/21/22 Labs - Once weekly:  CBC/D and BMP, ESR and CRP Method of administration: Per hemodialysis protocol at the HD center Method of administration may be changed at the discretion of home infusion pharmacist based upon assessment of the patient and/or caregiver's ability to self-administer the medication ordered.   Chlorhexidine Gluconate Cloth 2 % PADS Apply 6 each topically daily at 6 (six) AM.   Continuous Blood Gluc Receiver DEVI 1 Units by Does not apply route 4 (four) times daily -  before meals and at bedtime. May substitute for cheapest monitor with insurance   Continuous Blood Gluc Sensor (DEXCOM G7 SENSOR) MISC 1 Device by Does not apply route as directed.   ethyl chloride spray Apply 1 Application topically every Monday, Wednesday, and Friday with hemodialysis. Apply to arm   glucose blood (FREESTYLE TEST STRIPS) test strip Use as instructed   hydrALAZINE (APRESOLINE) 50 MG tablet Take 1 tablet by mouth twice a day, may take 1 extra  tablet by mouth daily only as needed for elevated blood pressure (Patient taking differently: Take 50 mg by mouth 3 (three) times daily. Take 50mg  by mouth three times daily except skip the morning dose on dialysis days)   insulin glargine (LANTUS SOLOSTAR) 100 UNIT/ML Solostar Pen Inject 18 Units into the skin every morning. (Patient taking differently: Inject 16 Units into the skin every morning.)   insulin lispro (HUMALOG KWIKPEN) 100 UNIT/ML KwikPen Inject 6 Units into the skin 2 (two) times daily with a meal. Max daily 35 units   Insulin Pen Needle 32G X 4 MM MISC 1 Device by Does not apply route in the morning, at noon, in the evening, and at bedtime.   metoCLOPramide (REGLAN) 10 MG tablet Take 1 tablet (10 mg  total) by mouth every 6 (six) hours. (Patient taking differently: Take 10 mg by mouth every 6 (six) hours as needed for nausea or vomiting.)   mirtazapine (REMERON) 15 MG tablet Take 15 mg by mouth at bedtime.   multivitamin (RENA-VIT) TABS tablet Take 1 tablet by mouth at bedtime.   ondansetron (ZOFRAN-ODT) 8 MG disintegrating tablet Take 1 tablet (8 mg total) by mouth every 8 (eight) hours as needed for nausea or vomiting.   SENSIPAR 30 MG tablet Take 30 mg by mouth daily.   sucroferric oxyhydroxide (VELPHORO) 500 MG chewable tablet Chew 1,500 mg by mouth 3 (three) times daily with meals.   No facility-administered encounter medications on file as of 07/06/2022.    Allergies (verified) Patient has no known allergies.   History: Past Medical History:  Diagnosis Date   Asthma    as a child, no problems as an adult, no inhaler   Cataract    NS OU   Chronic hypertension during pregnancy, antepartum 08/19/2017   Dehydration 01/28/2018   Depression during pregnancy, antepartum 07/07/2017   6/20: Short trial of zoloft previously, reports didn't help much but also didn't give it a chance Discussed r/b/a SSRIs in pregnancy, agrees to try Zoloft again, rx sent No SI/HI/red flags   Diabetes (HCC)    TYPE I. 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 A2/B GDM)  Required Referrals for A1GDM or  A2GDM: [x]  Diabetes Education and Testing Supplies [x]  Nutrition Cousult  For A2/B GDM or higher classes of DM [x]  Diabetes Education and Testing Supplies [x]  Nutrition Counsult [x]  Fetal ECHO after 20 weeks  [x]  Eye exam for retina evaluation - severe retinopathy 7/19  Base   Ventricular septal defect (VSD) of fetus in singleton pregnancy, antepartum 09/30/2017   May go to newborn nursery per Dr. Pocahontas Bing Echo prior to discharge   Past Surgical History:  Procedure Laterality Date   25 GAUGE PARS PLANA VITRECTOMY WITH 20 GAUGE MVR PORT FOR MACULAR HOLE Left 07/20/2018   Procedure: 25 GAUGE PARS PLANA VITRECTOMY LEFT EYE;  Surgeon: Rennis Chris, MD;  Location: [facility] OR;  Service: Ophthalmology;  Laterality: Left;   AV FISTULA PLACEMENT Left 08/11/2021   Procedure: INSERTION OF LEFT ARM ARTERIOVENOUS (AV) GORE-TEX GRAFT;  Surgeon: Victorino Sparrow, MD;  Location: [facility] OR;  Service: Vascular;  Laterality: Left;  PERIPHERAL NERVE BLOCK   AVGG REMOVAL Left 06/22/2022   Procedure: REMOVAL OF ARTERIOVENOUS GORETEX GRAFT (AVGG) LEFT ARM WITH VEIN PATCH OF BRACHIAL ARTERY;  Surgeon: Chuck Hint, MD;  Location: [facility] OR;  Service: Vascular;  Laterality: Left;   CAPD INSERTION N/A 09/10/2020   Procedure: LAPAROSCOPIC INSERTION CONTINUOUS AMBULATORY PERITONEAL DIALYSIS  (CAPD) CATHETER;  Surgeon: Nada Libman, MD;  Location: [facility] OR;  Service: Vascular;  Laterality: N/A;   CESAREAN SECTION N/A 12/26/2017   Procedure: CESAREAN SECTION;  Surgeon: Tereso Newcomer, MD;  [facility]: [facility];  Service: Obstetrics;  Laterality: N/A;   EYE EXAMINATION UNDER ANESTHESIA Right 07/20/2018   Procedure: Eye Exam Under Anesthesia RIGHT EYE;  Surgeon: Rennis Chris, MD;  Location: [facility] OR;  Service: Ophthalmology;  Laterality: Right;   EYE SURGERY Left 07/2018   GAS INSERTION Left 07/19/2019   Procedure: INSERTION OF GAS;  Surgeon: Rennis Chris, MD;  Location: [facility] OR;  Service: Ophthalmology;  Laterality: Left;  SF6    INJECTION OF SILICONE OIL Left 07/20/2018   Procedure: Injection Of Silicone Oil LEFT EYE;  Surgeon: Rennis Chris, MD;  Location: [facility] OR;  Service: Ophthalmology;  Laterality: Left;   INSERTION OF DIALYSIS CATHETER Right 07/05/2021   Procedure: REMOVAL OF DIALYSIS CATHETER;  Surgeon: Leonie Douglas, MD;  Location: [facility] OR;  Service: Vascular;  Laterality: Right;   INSERTION OF DIALYSIS CATHETER Right 07/09/2021   Procedure: INSERTION OF TUNNELED DIALYSIS CATHETER;  Surgeon: Leonie Douglas, MD;  Location: [facility] OR;  Service: Vascular;  Laterality: Right;   INSERTION OF DIALYSIS CATHETER Left 07/09/2021   Procedure: INSERTION OF TUNNELED  DIALYSIS CATHETER;  Surgeon: Leonie Douglas, MD;  Location: [facility] OR;  Service: Vascular;  Laterality: Left;   INSERTION OF DIALYSIS CATHETER N/A 06/22/2022   Procedure: INSERTION OF LEFT INTERNAL JUGULAR TRIALYSIS DIALYSIS CATHETER;  Surgeon: Chuck Hint, MD;  Location: [facility] OR;  Service: Vascular;  Laterality: N/A;   INSERTION OF DIALYSIS CATHETER Left 06/28/2022   Procedure: ULTRASOUND GUIDED INSERTION OF TUNNELED DIALYSIS CATHETER;  Surgeon: Leonie Douglas, MD;  Location: [facility] OR;  Service: Vascular;  Laterality: Left;   IR FLUORO GUIDE CV LINE RIGHT  06/02/2020   IR US GUIDE VASC ACCESS RIGHT  06/02/2020   LASER PHOTO ABLATION Right 07/20/2018   Procedure: Laser Photo Ablation RIGHT EYE;  Surgeon: Rennis Chris, MD;  Location: [facility] OR;  Service: Ophthalmology;  Laterality: Right;   MEMBRANE PEEL Left 07/20/2018   Procedure: Membrane Peel LEFT EYE;  Surgeon: Rennis Chris, MD;  Location: [facility] OR;  Service: Ophthalmology;  Laterality: Left;   MEMBRANE PEEL Left 07/19/2019   Procedure: MEMBRANE PEEL;  Surgeon: Rennis Chris, MD;  Location: [facility] OR;  Service: Ophthalmology;  Laterality: Left;   MITOMYCIN C APPLICATION Bilateral 07/20/2018   Procedure: Avastin Application;  Surgeon: Rennis Chris, MD;  Location: [facility] OR;  Service: Ophthalmology;  Laterality: Bilateral;    PHOTOCOAGULATION WITH LASER Left 07/20/2018   Procedure: Photocoagulation With Laser LEFT EYE;  Surgeon: Rennis Chris, MD;  Location: [facility] OR;  Service: Ophthalmology;  Laterality: Left;   PHOTOCOAGULATION WITH LASER Left 07/19/2019   Procedure: PHOTOCOAGULATION WITH LASER;  Surgeon: Rennis Chris, MD;  Location: [facility] OR;  Service: Ophthalmology;  Laterality: Left;  RETINAL DETACHMENT SURGERY Left 07/20/2018   Dr. Rennis Chris   SILICON OIL REMOVAL Left 07/19/2019   Procedure: 25g PARS PLANA VITRECTOMY WITH SILICON OIL REMOVAL;  Surgeon: Rennis Chris, MD;  Location: [facility] OR;  Service: Ophthalmology;  Laterality: Left;   TEE WITHOUT CARDIOVERSION N/A 07/08/2021   Procedure: TRANSESOPHAGEAL ECHOCARDIOGRAM (TEE);  Surgeon: Little Ishikawa, MD;  Location: [facility] ENDOSCOPY;  Service: Cardiovascular;  Laterality: N/A;   TEE WITHOUT CARDIOVERSION N/A 06/29/2022   Procedure: TRANSESOPHAGEAL ECHOCARDIOGRAM;  Surgeon: Wendall Stade, MD;  Location: [facility] INVASIVE CV LAB;  Service: Cardiovascular;  Laterality: N/A;   WISDOM TOOTH EXTRACTION     Family History  Problem Relation Age of Onset   Diabetes Mother    Aneurysm Mother 41   Seizures Mother    Diabetes Father    Cataracts Father    COPD Father    Heart attack Father    Heart disease Father    Healthy Sister    Healthy Daughter    Stroke Maternal Grandfather    Amblyopia Neg Hx    Blindness Neg Hx    Glaucoma Neg Hx    Macular degeneration Neg Hx    Retinal detachment Neg Hx    Strabismus Neg Hx    Retinitis pigmentosa Neg Hx    Colon cancer Neg Hx    Stomach cancer Neg Hx    Esophageal cancer Neg Hx    Pancreatic cancer Neg Hx    Liver disease Neg Hx    Social History   Socioeconomic History   Marital status: Significant Other    Spouse name: Not on file   Number of children: Not on file   Years of education: Not on file   Highest education level: Not on file  Occupational History   Not on file  Tobacco Use   Smoking status:  Never   Smokeless tobacco: Never  Vaping Use   Vaping Use: Never used  Substance and Sexual Activity   Alcohol use: Not Currently   Drug use: Never   Sexual activity: Not Currently    Birth control/protection: None  Other Topics Concern   Not on file  Social History Narrative   Not on file   Social Determinants of Health   Financial Resource Strain: Low Risk  (07/06/2022)   Overall Financial Resource Strain (CARDIA)    Difficulty of Paying Living Expenses: Not very hard  Food Insecurity: No Food Insecurity (07/06/2022)   Hunger Vital Sign    Worried About Running Out of Food in the Last Year: Never true    Ran Out of Food in the Last Year: Never true  Transportation Needs: No Transportation Needs (07/06/2022)   PRAPARE - Administrator, Civil Service (Medical): No    Lack of Transportation (Non-Medical): No  Physical Activity: Inactive (07/06/2022)   Exercise Vital Sign    Days of Exercise per Week: 0 days    Minutes of Exercise per Session: 0 min  Stress: No Stress Concern Present (07/06/2022)   [employer] of Occupational Health - Occupational Stress Questionnaire    Feeling of Stress : Only a little  Social Connections: Socially Isolated (07/06/2022)   Social Connection and Isolation Panel [NHANES]    Frequency of Communication with Friends and Family: Once a week    Frequency of Social Gatherings with Friends and Family: Never    Attends Religious Services: More than 4 times per year    Active Member of Clubs or Organizations: No  Attends Banker Meetings: Never    Marital Status: Never married    Tobacco Counseling Counseling given: Not Answered   Clinical Intake:  Pre-visit preparation completed: Yes  Pain : No/denies pain Pain Score: 0-No pain Faces Pain Scale: No hurt  Faces Pain Scale: No hurt  Nutritional Risks: None Diabetes: Yes CBG done?: No Did pt. bring in CBG monitor from home?: No  How often do you need to have  someone help you when you read instructions, pamphlets, or other written materials from your doctor or pharmacy?: 1 - Never What is the last grade level you completed in school?: 16 years  Interpreter Needed?: No  Information entered by :: Stacie Templin,cma   Activities of Daily Living    07/06/2022    1:06 PM 07/06/2022    9:25 AM  In your present state of health, do you have any difficulty performing the following activities:  Hearing? 0 0  Vision? 0 0  Difficulty concentrating or making decisions? 0 0  Walking or climbing stairs? 0 0  Dressing or bathing? 0 0  Doing errands, shopping? 0 0    Patient Care Team: Faith Rogue, DO as PCP - General (Internal Medicine) Quintella Reichert, MD as PCP - Cardiology (Cardiology) Ginette Otto, [facility], [facility]  Indicate any recent Medical Services you may have received from other than [facility] providers in the past year (date may be approximate).     Assessment:   This is a routine wellness examination for Elizabet.  Hearing/Vision screen No results found.  Dietary issues and exercise activities discussed:     Goals Addressed   None   Depression Screen    07/06/2022    1:07 PM 07/06/2022    9:25 AM 07/23/2021    3:07 PM 04/20/2021    1:27 PM 03/11/2021   10:13 AM 02/24/2021    1:32 PM 03/22/2019   11:21 AM  PHQ 2/9 Scores  PHQ - 2 Score 0 0 0 0 0 4 0  PHQ- 9 Score 0 0  0 0 10     Fall Risk    07/06/2022    1:06 PM 07/06/2022    9:25 AM 07/23/2021    3:06 PM 04/20/2021    1:26 PM 03/11/2021   10:12 AM  Fall Risk   Falls in the past year? 0 0 0 0 0  Number falls in past yr: 0  0 0 0  Injury with Fall? 0  0 0 0  Risk for fall due to : No Fall Risks No Fall Risks  No Fall Risks No Fall Risks  Follow up Falls evaluation completed;Falls prevention discussed Falls evaluation completed Falls evaluation completed Falls prevention discussed;Falls evaluation completed Falls evaluation  completed;Falls prevention discussed    MEDICARE RISK AT HOME:   TIMED UP AND GO:  Was the test performed? No    Cognitive Function:        07/06/2022    1:07 PM  6CIT Screen  What Year? 0 points  What month? 0 points  What time? 0 points  Count back from 20 0 points  Months in reverse 0 points  Repeat phrase 0 points  Total Score 0 points    Immunizations Immunization History  Administered Date(s) Administered   Hepb-cpg 06/12/2020   Influenza-Unspecified 11/12/2010, 10/18/2020   PPD Test 04/17/2020, 04/23/2020, 10/14/2020   Pneumococcal Polysaccharide-23 11/12/2010   Tdap 11/14/2017   Unspecified SARS-COV-2 Vaccination 08/19/2018, 09/02/2018  TDAP status: Up to date  Flu Vaccine status: Up to date  Pneumococcal vaccine status: Due, Education has been provided regarding the importance of this vaccine. Advised may receive this vaccine at local pharmacy or Health Dept. Aware to provide a copy of the vaccination record if obtained from local pharmacy or Health Dept. Verbalized acceptance and understanding.  Covid-19 vaccine status: Completed vaccines  Qualifies for Shingles Vaccine? No   Zostavax completed No   Shingrix Completed?: No.    Education has been provided regarding the importance of this vaccine. Patient has been advised to call insurance company to determine out of pocket expense if they have not yet received this vaccine. Advised may also receive vaccine at local pharmacy or Health Dept. Verbalized acceptance and understanding.  Screening Tests Health Maintenance  Topic Date Due   COVID-19 Vaccine (3 - Mixed Product risk series) 09/30/2018   OPHTHALMOLOGY EXAM  02/18/2022   INFLUENZA VACCINE  08/19/2022   FOOT EXAM  10/14/2022   HEMOGLOBIN A1C  12/22/2022   Medicare Annual Wellness (AWV)  07/06/2023   PAP SMEAR-Modifier  03/11/2024   DTaP/Tdap/Td (2 - Td or Tdap) 11/15/2027   Hepatitis C Screening  Completed   HIV Screening  Completed   HPV  VACCINES  Aged Out    Health Maintenance  Health Maintenance Due  Topic Date Due   COVID-19 Vaccine (3 - Mixed Product risk series) 09/30/2018   OPHTHALMOLOGY EXAM  02/18/2022     Lung Cancer Screening: (Low Dose CT Chest recommended if Age 24-80 years, 20 pack-year currently smoking OR have quit w/in 15years.) does not qualify.   Lung Cancer Screening Referral: N/A  Additional Screening:  Hepatitis C Screening: does not qualify; Completed 06/30/2021  Vision Screening: Recommended annual ophthalmology exams for early detection of glaucoma and other disorders of the eye. Is the patient up to date with their annual eye exam?  No  Who is the provider or what is the name of the office in which the patient attends annual eye exams? Dr.Brian Vanessa Barbara If pt is not established with a provider, would they like to be referred to a provider to establish care? No .   Dental Screening: Recommended annual dental exams for proper oral hygiene  Diabetic Foot Exam: Diabetic Foot Exam: Completed 10/13/2021  Community Resource Referral / Chronic Care Management: CRR required this visit?  No   CCM required this visit?  No     Plan:     I have personally reviewed and noted the following in the patient's chart:   Medical and social history Use of alcohol, tobacco or illicit drugs  Current medications and supplements including opioid prescriptions. Patient is not currently taking opioid prescriptions. Functional ability and status Nutritional status Physical activity Advanced directives List of other physicians Hospitalizations, surgeries, and ER visits in previous 12 months Vitals Screenings to include cognitive, depression, and falls Referrals and appointments  In addition, I have reviewed and discussed with patient certain preventive protocols, quality metrics, and best practice recommendations. A written personalized care plan for preventive services as well as general preventive  health recommendations were provided to patient.     Cala Bradford, CMA   07/06/2022   After Visit Summary: (MyChart) Due to this being a telephonic visit, the after visit summary with patients personalized plan was offered to patient via MyChart   Nurse Notes: Face-To-Face Visit  Ms. Hower , Thank you for taking time to come for your Medicare Wellness Visit. I appreciate your ongoing  commitment to your health goals. Please review the following plan we discussed and let me know if I can assist you in the future.   These are the goals we discussed:  Goals      Blood Pressure < 130/80        This is a list of the screening recommended for you and due dates:  Health Maintenance  Topic Date Due   COVID-19 Vaccine (3 - Mixed Product risk series) 09/30/2018   Eye exam for diabetics  02/18/2022   Flu Shot  08/19/2022   Complete foot exam   10/14/2022   Hemoglobin A1C  12/22/2022   Medicare Annual Wellness Visit  07/06/2023   Pap Smear  03/11/2024   DTaP/Tdap/Td vaccine (2 - Td or Tdap) 11/15/2027   Hepatitis C Screening  Completed   HIV Screening  Completed   HPV Vaccine  Aged Out

## 2022-07-06 NOTE — Assessment & Plan Note (Addendum)
Patient presents for follow-up of her hypertension.  Her blood pressure is 160/83 today.  However, patient was told not to take her antihypertensives in the morning on dialysis days, today being one of them. -Continue amlodipine 10 mg nightly -Continue carvedilol 25 mg twice daily -Continue hydralazine 50 mg 3 times daily, skip morning dose on dialysis days

## 2022-07-06 NOTE — Progress Notes (Signed)
CC: Hospital follow-up, establish care  HPI:  [name] is a 32 y.o. with past medical history as below who presents for hospital follow-up and to establish care.  Please see detailed assessment plan for HPI.  Past Medical History:  Diagnosis Date   Asthma    as a child, no problems as an adult, no inhaler   Cataract    NS OU   Chronic hypertension during pregnancy, antepartum 08/19/2017   Dehydration 01/28/2018   Depression during pregnancy, antepartum 07/07/2017   6/20: Short trial of zoloft previously, reports didn't help much but also didn't give it a chance Discussed r/b/a SSRIs in pregnancy, agrees to try Zoloft again, rx sent No SI/HI/red flags   Diabetes (HCC)    TYPE I. 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 A2/B GDM)  Required Referrals for A1GDM or A2GDM: [x]  Diabetes Education and Testing Supplies [x]  Nutrition Cousult  For A2/B GDM or higher classes of DM [x]  Diabetes Education and Testing Supplies [x]  Nutrition Counsult [x]  Fetal ECHO after 20 weeks  [x]  Eye exam for retina evaluation - severe retinopathy 7/19  Base   Ventricular septal defect (VSD) of fetus in singleton pregnancy, antepartum 09/30/2017   May go to newborn nursery per Dr. Hodge Bing Echo prior to discharge   Review of Systems: Please see detailed assessment plan for pertinent ROS.  Physical  Exam:  Vitals:   07/06/22 0922  BP: (!) 160/83  Pulse: 78  Temp: 98 F (36.7 C)  TempSrc: Oral  SpO2: 100%  Weight: 153 lb 12.8 oz (69.8 kg)  Height: 5\' 6"  (1.676 m)   Physical Exam Constitutional:      General: She is not in acute distress. HENT:     Head: Normocephalic and atraumatic.  Eyes:     Extraocular Movements: Extraocular movements intact.  Cardiovascular:     Heart sounds: No murmur heard.    Comments: Left arm graft status post excision.  Nontender, nonerythematous and without purulent drainage.  Bandaging C/D/I.  Pulses intact. Pulmonary:     Effort: Pulmonary effort is normal.  Chest:     Comments: Left IJ tunneled catheter in place.  Nontender, nonerythematous. Musculoskeletal:     Cervical back: Neck supple.  Skin:    General: Skin is warm and dry.  Neurological:     Mental Status: She is alert and oriented to person, place, and time.  Psychiatric:        Mood and Affect: Mood normal.        Behavior: Behavior normal.      Assessment & Plan:   See Encounters Tab for problem based charting.  Essential hypertension Patient presents for follow-up of her hypertension.  Her blood pressure is 160/83 today.  However, patient was told not to take her antihypertensives in the morning on dialysis days, today being one of them. -Continue amlodipine 10 mg nightly -  Continue carvedilol 25 mg twice daily -Continue hydralazine 50 mg 3 times daily, skip morning dose on dialysis days  Infection of AV graft for dialysis [facility]) Patient presents with a history of MSSA bacteremia in the setting of left upper extremity AV graft.  Her arm is healing well and she denies fevers or chills.  Exam does not reveal any sign of infection and she is afebrile today.  AV graft now status post excision and left IJ tunneled catheter in place and functioning well. -Continue cefazolin 2 g IV with dialysis -Follow-up scheduled with ID on 07/08  Type 1 diabetes mellitus with retinopathy  [facility]) Patient presents for follow-up of her type 1 diabetes.  Since discharge, she has been taking Lantus 18 u daily and Humalog 5 units with meals (which is typically twice daily).  Her blood sugar has been running in the mid 200s, no readings lower than 100.  Her dad indicates that she is spiking a little bit after meals so will increase mealtime dose to 6 units. -Continue Lantus 18 units daily -Increase Humalog to 6 units with meals -Follow-up with endocrinology in August -Referral sent for ophthalmology  Patient discussed with Dr. Criselda Peaches

## 2022-07-06 NOTE — Assessment & Plan Note (Signed)
Patient presents with a history of MSSA bacteremia in the setting of left upper extremity AV graft.  Her arm is healing well and she denies fevers or chills.  Exam does not reveal any sign of infection and she is afebrile today.  AV graft now status post excision and left IJ tunneled catheter in place and functioning well. -Continue cefazolin 2 g IV with dialysis -Follow-up scheduled with ID on 07/08

## 2022-07-06 NOTE — Telephone Encounter (Signed)
Call from [place]. Brady RN [facility] increase times of visit 1 time a week 9 weeks. 2 prn visits.  Observation and Wound Care.  Left. Fistutula.  [place] in left chest. Area as well.

## 2022-07-06 NOTE — Telephone Encounter (Signed)
Call from Glori Bickers, RN [facility] is follow ing patient or an infected fistula since discharge.  Needs VO for Nursing care 1 time a week for 4 weeks and 2 times a month for 1 month.  Then 2 prn visits. Nerd for Wound Care checks, Observation and Wound Care.  Medication Management . Pt for Endurance and Balance training. VO approval given. Will  send to Dr. Cliffton Asters fr confirmation.

## 2022-07-06 NOTE — Assessment & Plan Note (Signed)
Patient presents for follow-up of her type 1 diabetes.  Since discharge, she has been taking Lantus 18 u daily and Humalog 5 units with meals (which is typically twice daily).  Her blood sugar has been running in the mid 200s, no readings lower than 100.  Her dad indicates that she is spiking a little bit after meals so will increase mealtime dose to 6 units. -Continue Lantus 18 units daily -Increase Humalog to 6 units with meals -Follow-up with endocrinology in August -Referral sent for ophthalmology

## 2022-07-06 NOTE — Patient Instructions (Addendum)
[name], it was a pleasure seeing you today!  Today we discussed: Diabetes - increase Humalog to 6 u with meals.  Be sure to see infectious disease on 07/08.  I have ordered the following labs today:  Lab Orders  No laboratory test(s) ordered today     Tests ordered today:  none  Referrals ordered today:    Referral Orders         Ambulatory referral to Ophthalmology      I have ordered the following medication/changed the following medications:   Stop the following medications: Medications Discontinued During This Encounter  Medication Reason   insulin lispro (HUMALOG KWIKPEN) 100 UNIT/ML KwikPen Reorder     Start the following medications: Meds ordered this encounter  Medications   insulin lispro (HUMALOG KWIKPEN) 100 UNIT/ML KwikPen    Sig: Inject 6 Units into the skin 2 (two) times daily with a meal. Max daily 35 units    Dispense:  30 mL    Refill:  3     Follow-up: 3 months   Please make sure to arrive 15 minutes prior to your next appointment. If you arrive late, you may be asked to reschedule.   We look forward to seeing you next time. Please call our clinic at [phone] if you have any questions or concerns. The best time to call is Monday-Friday from 9am-4pm, but there is someone available 24/7. If after hours or the weekend, call the main hospital number and ask for the Internal Medicine Resident On-Call. If you need medication refills, please notify your pharmacy one week in advance and they will send [name] a request.  Thank you for letting us take part in your care. Wishing you the best!  Thank you, Adron Bene, MD

## 2022-07-08 ENCOUNTER — Encounter: Payer: Self-pay | Admitting: Vascular Surgery

## 2022-07-08 ENCOUNTER — Ambulatory Visit (INDEPENDENT_AMBULATORY_CARE_PROVIDER_SITE_OTHER): Payer: Medicare Other | Admitting: Vascular Surgery

## 2022-07-08 VITALS — BP 188/106 | HR 84 | Temp 98.3°F | Wt 147.0 lb

## 2022-07-08 DIAGNOSIS — Z992 Dependence on renal dialysis: Secondary | ICD-10-CM

## 2022-07-08 DIAGNOSIS — N186 End stage renal disease: Secondary | ICD-10-CM

## 2022-07-08 NOTE — Progress Notes (Signed)
REASON FOR VISIT:   Follow-up after removal of infected left upper arm graft  MEDICAL ISSUES:   END-STAGE RENAL DISEASE: The patient had an infected left upper arm graft removed on 06/22/2022.  She still has an open wound.  I removed her sutures in the office today.  Will bring her back for a vein map of the right arm in 3 weeks to evaluate for new access in the right arm.  I suspect she will need a graft in the right arm.  I would like the wound to heal completely however before scheduling surgery.  She has a functioning tunneled dialysis catheter.  HPI:   Barbara Donovan is a pleasant 32 y.o. female who presented with an infected left upper arm graft.  On 06/22/2022, she underwent removal of the left upper arm graft with vein patch angioplasty of the brachial artery.  She also had placement of a temporary dialysis catheter.  Today she has no specific complaints.  Past Medical History:  Diagnosis Date   Asthma    as a child, no problems as an adult, no inhaler   Cataract    NS OU   Chronic hypertension during pregnancy, antepartum 08/19/2017   Dehydration 01/28/2018   Depression during pregnancy, antepartum 07/07/2017   6/20: Short trial of zoloft previously, reports didn't help much but also didn't give it a chance Discussed r/b/a SSRIs in pregnancy, agrees to try Zoloft again, rx sent No SI/HI/red flags   Diabetes (HCC)    TYPE I. 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 A2/B GDM)  Required Referrals for A1GDM or A2GDM: [x]  Diabetes Education and Testing Supplies [x]  Nutrition Cousult  For A2/B GDM or higher classes of DM [x]  Diabetes Education and Testing Supplies [x]  Nutrition Counsult [x]  Fetal ECHO after 20 weeks  [x]  Eye exam for retina evaluation - severe retinopathy 7/19  Base   Ventricular septal defect (VSD) of fetus in singleton pregnancy, antepartum 09/30/2017   May go to newborn nursery per Dr.  Bing Echo prior to discharge    Family History  Problem Relation Age of Onset   Diabetes Mother    Aneurysm Mother 90   Seizures Mother    Diabetes Father    Cataracts Father    COPD Father    Heart attack Father    Heart disease Father    Healthy Sister    Healthy Daughter    Stroke Maternal Grandfather    Amblyopia Neg Hx    Blindness Neg Hx    Glaucoma Neg Hx    Macular degeneration Neg Hx    Retinal detachment Neg Hx    Strabismus Neg Hx    Retinitis pigmentosa Neg Hx    Colon cancer Neg Hx    Stomach cancer Neg Hx    Esophageal cancer Neg Hx    Pancreatic cancer Neg Hx  Liver disease Neg Hx     SOCIAL HISTORY: Social History   Tobacco Use   Smoking status: Never   Smokeless tobacco: Never  Substance Use Topics   Alcohol use: Not Currently    No Known Allergies  Current Outpatient Medications  Medication Sig Dispense Refill   acetaminophen (TYLENOL) 325 MG tablet Take 650 mg by mouth every 6 (six) hours as needed for mild pain.     amLODipine (NORVASC) 10 MG tablet Take 1 tablet (10 mg total) by mouth daily. (Patient taking differently: Take 10 mg by mouth every evening.) 90 tablet 1   calcitRIOL (ROCALTROL) 0.5 MCG capsule Take 0.5 mcg by mouth every Monday, Wednesday, and Friday with hemodialysis.     carvedilol (COREG) 25 MG tablet Take 1 tablet (25 mg total) by mouth 2 (two) times daily. 180 tablet 3   ceFAZolin (ANCEF)  IVPB Inject 2 g into the vein every Monday, Wednesday, and Friday with hemodialysis for 21 days. Indication:  MSSA bacteremia/AVG infection First Dose: Yes Last Day of Therapy:  07/21/22 Labs - Once weekly:  CBC/D and BMP, ESR and CRP Method of administration: Per hemodialysis protocol at the HD center Method of administration may be changed at the discretion of home infusion pharmacist based upon assessment of the patient and/or caregiver's ability to self-administer the medication ordered. 10 Units 0   Chlorhexidine Gluconate Cloth 2 % PADS Apply 6 each topically daily at 6 (six) AM. 120 each 0   Continuous Blood Gluc Receiver DEVI 1 Units by Does not apply route 4 (four) times daily -  before meals and at bedtime. May substitute for cheapest monitor with insurance 1 Units 0   Continuous Blood Gluc Sensor (DEXCOM G7 SENSOR) MISC 1 Device by Does not apply route as directed. 9 each 3   ethyl chloride spray Apply 1 Application topically every Monday, Wednesday, and Friday with hemodialysis. Apply to arm     glucose blood (FREESTYLE TEST STRIPS) test strip Use as instructed 100 each 0   hydrALAZINE (APRESOLINE) 50 MG tablet Take 1 tablet by mouth twice a day, may take 1 extra tablet by mouth daily only as needed for elevated blood pressure (Patient taking differently: Take 50 mg by mouth 3 (three) times daily. Take 50mg  by mouth three times daily except skip the morning dose on dialysis days) 270 tablet 3   insulin glargine (LANTUS SOLOSTAR) 100 UNIT/ML Solostar Pen Inject 18 Units into the skin every morning. (Patient taking differently: Inject 16 Units into the skin every morning.) 15 mL 6   insulin lispro (HUMALOG KWIKPEN) 100 UNIT/ML KwikPen Inject 6 Units into the skin 2 (two) times daily with a meal. Max daily 35 units 30 mL 3   Insulin Pen Needle 32G X 4 MM MISC 1 Device by Does not apply route in the morning, at noon, in the evening, and at bedtime. 400 each 3   metoCLOPramide (REGLAN) 10 MG  tablet Take 1 tablet (10 mg total) by mouth every 6 (six) hours. (Patient taking differently: Take 10 mg by mouth every 6 (six) hours as needed for nausea or vomiting.) 30 tablet 0   mirtazapine (REMERON) 15 MG tablet Take 15 mg by mouth at bedtime.     multivitamin (RENA-VIT) TABS tablet Take 1 tablet by mouth at bedtime. 90 tablet 1   ondansetron (ZOFRAN-ODT) 8 MG disintegrating tablet Take 1 tablet (8 mg total) by mouth every 8 (eight) hours as needed for nausea or vomiting. 20 tablet 0  SENSIPAR 30 MG tablet Take 30 mg by mouth daily.     sucroferric oxyhydroxide (VELPHORO) 500 MG chewable tablet Chew 1,500 mg by mouth 3 (three) times daily with meals.     No current facility-administered medications for this visit.    REVIEW OF SYSTEMS:  [X]  denotes positive finding, [ ]  denotes negative finding Cardiac  Comments:  Chest pain or chest pressure:    Shortness of breath upon exertion:    Short of breath when lying flat:    Irregular heart rhythm:        Vascular    Pain in calf, thigh, or hip brought on by ambulation:    Pain in feet at night that wakes you up from your sleep:     Blood clot in your veins:    Leg swelling:         Pulmonary    Oxygen at home:    Productive cough:     Wheezing:         Neurologic    Sudden weakness in arms or legs:     Sudden numbness in arms or legs:     Sudden onset of difficulty speaking or slurred speech:    Temporary loss of vision in one eye:     Problems with dizziness:         Gastrointestinal    Blood in stool:     Vomited blood:         Genitourinary    Burning when urinating:     Blood in urine:        Psychiatric    Major depression:         Hematologic    Bleeding problems:    Problems with blood clotting too easily:        Skin    Rashes or ulcers:        Constitutional    Fever or chills:     PHYSICAL EXAM:   Vitals:   07/08/22 1525  BP: (!) 188/106  Pulse: 84  Temp: 98.3 F (36.8 C)  TempSrc: Temporal   SpO2: 98%  Weight: 147 lb (66.7 kg)    GENERAL: The patient is a well-nourished female, in no acute distress. The vital signs are documented above. CARDIAC: There is a regular rate and rhythm.  VASCULAR: She has a palpable left radial pulse. Incisions are healing nicely. The open wound is about a centimeter in diameter.  PULMONARY: There is good air exchange bilaterally without wheezing or rales. ABDOMEN: Soft and non-tender with normal pitched bowel sounds.  MUSCULOSKELETAL: There are no major deformities or cyanosis. NEUROLOGIC: No focal weakness or paresthesias are detected. SKIN: There are no ulcers or rashes noted. PSYCHIATRIC: The patient has a normal affect.  DATA:    No new data.  Waverly Ferrari [facility] [phone]

## 2022-07-08 NOTE — Progress Notes (Signed)
Internal Medicine Clinic Attending  Case discussed with Dr. White  at the time of the visit.  We reviewed the resident's history and exam and pertinent patient test results.  I agree with the assessment, diagnosis, and plan of care documented in the resident's note.  

## 2022-07-10 ENCOUNTER — Other Ambulatory Visit: Payer: Self-pay | Admitting: Physician Assistant

## 2022-07-14 ENCOUNTER — Other Ambulatory Visit: Payer: Self-pay | Admitting: *Deleted

## 2022-07-14 DIAGNOSIS — N186 End stage renal disease: Secondary | ICD-10-CM

## 2022-07-21 ENCOUNTER — Telehealth: Payer: Self-pay

## 2022-07-21 NOTE — Telephone Encounter (Signed)
Hoy Morn at [facility] (Kidney Transplant office) called to inform provider that patient had a CT abdomen pelvis done on 6/17 that showed questionable infection. Report put in Dr.Manandhar basket.  ID provider at [facility] medical center also ordered chest CT w/o contrast patient is set to have on 7/12. Patient scheduled to see Dr.Manandhar on 07/26/2022. Please send office note to [facility] Kidney Transplant office at [phone].   If any questions call back number is [phone]

## 2022-07-22 NOTE — Progress Notes (Signed)
Internal Medicine Clinic Attending  Case and documentation of Dr. Cliffton Asters  soon after the resident saw the patient reviewed.  I reviewed the AWV findings.  I agree with the plan of care documented in the AWV note.

## 2022-07-26 ENCOUNTER — Encounter: Payer: Self-pay | Admitting: Infectious Diseases

## 2022-07-26 ENCOUNTER — Other Ambulatory Visit: Payer: Self-pay | Admitting: Cardiology

## 2022-07-26 ENCOUNTER — Other Ambulatory Visit: Payer: Self-pay

## 2022-07-26 ENCOUNTER — Telehealth: Payer: Self-pay

## 2022-07-26 ENCOUNTER — Ambulatory Visit (INDEPENDENT_AMBULATORY_CARE_PROVIDER_SITE_OTHER): Payer: 59 | Admitting: Infectious Diseases

## 2022-07-26 VITALS — BP 199/109 | HR 83 | Temp 97.8°F | Wt 150.0 lb

## 2022-07-26 DIAGNOSIS — Z79899 Other long term (current) drug therapy: Secondary | ICD-10-CM | POA: Diagnosis not present

## 2022-07-26 DIAGNOSIS — N186 End stage renal disease: Secondary | ICD-10-CM

## 2022-07-26 DIAGNOSIS — T827XXD Infection and inflammatory reaction due to other cardiac and vascular devices, implants and grafts, subsequent encounter: Secondary | ICD-10-CM

## 2022-07-26 DIAGNOSIS — B9561 Methicillin susceptible Staphylococcus aureus infection as the cause of diseases classified elsewhere: Secondary | ICD-10-CM | POA: Diagnosis not present

## 2022-07-26 DIAGNOSIS — J984 Other disorders of lung: Secondary | ICD-10-CM | POA: Insufficient documentation

## 2022-07-26 DIAGNOSIS — R7881 Bacteremia: Secondary | ICD-10-CM | POA: Diagnosis not present

## 2022-07-26 DIAGNOSIS — Z992 Dependence on renal dialysis: Secondary | ICD-10-CM | POA: Diagnosis not present

## 2022-07-26 NOTE — Progress Notes (Addendum)
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) 10/13/2021   Greater trochanteric pain syndrome of right lower extremity 07/23/2021   MSSA bacteremia 07/04/2021   Hyperlipidemia 04/20/2021   Anxiety and depression 02/04/2021   DKA, type 1 (HCC) 11/10/2020   Type 1 diabetes mellitus with hyperglycemia (HCC) 11/10/2020   ESRD on hemodialysis (HCC) 11/10/2020   Anemia due to stage 5 chronic kidney disease (HCC) 03/04/2020   Intractable nausea and vomiting 01/22/2018   Essential hypertension 08/21/2017   Diabetic retinopathy (HCC) 06/09/2017   Asthma 10/30/2013    Patient's Medications  New Prescriptions   No medications on file  Previous Medications   ACETAMINOPHEN (TYLENOL) 325 MG TABLET    Take 650 mg by mouth every 6 (six) hours as needed for mild pain.   AMLODIPINE (NORVASC) 10 MG TABLET    Take 1 tablet (10 mg total) by mouth daily.   CALCITRIOL (ROCALTROL) 0.5 MCG CAPSULE    Take 0.5 mcg by mouth every Monday, Wednesday, and Friday with hemodialysis.   CARVEDILOL (COREG) 25 MG TABLET    Take 1 tablet (25 mg total) by mouth 2 (two) times daily.   CHLORHEXIDINE GLUCONATE CLOTH 2 % PADS    Apply 6 each topically daily at 6 (six) AM.   CONTINUOUS BLOOD GLUC RECEIVER DEVI    1 Units by Does not apply route 4 (four) times daily -  before meals and at bedtime. May substitute for cheapest monitor with insurance   CONTINUOUS BLOOD GLUC SENSOR (DEXCOM G7 SENSOR) MISC    1 Device by Does not apply route as directed.   ETHYL CHLORIDE SPRAY    Apply 1 Application topically every Monday, Wednesday, and Friday with hemodialysis. Apply to arm   GLUCOSE BLOOD (FREESTYLE TEST STRIPS) TEST STRIP    Use as instructed   HYDRALAZINE (APRESOLINE) 50 MG TABLET    TAKE 1 TAB TWICE A DAY, MAY TAKE 1 EXTRA TAB DAILY ONLY AS NEEDED FOR ELEVATED BLOOD PRESSURE    INSULIN GLARGINE (LANTUS SOLOSTAR) 100 UNIT/ML SOLOSTAR PEN    Inject 18 Units into the skin every morning.   INSULIN LISPRO (HUMALOG KWIKPEN) 100 UNIT/ML KWIKPEN    Inject 6 Units into the skin 2 (two) times daily with a meal. Max daily 35 units   INSULIN PEN NEEDLE 32G X 4 MM MISC    1 Device by Does not apply route in the morning, at noon, in the evening, and at bedtime.   METOCLOPRAMIDE (REGLAN) 10 MG TABLET    Take 1 tablet (10 mg total) by mouth every 6 (six) hours.   MIRTAZAPINE (REMERON) 15 MG TABLET    Take 15 mg by mouth at bedtime.   MULTIVITAMIN (RENA-VIT) TABS TABLET    Take 1 tablet by mouth at bedtime.   ONDANSETRON (ZOFRAN-ODT) 8 MG DISINTEGRATING TABLET    Take 1 tablet (8 mg total) by mouth every 8 (eight) hours as needed for nausea or vomiting.   SENSIPAR 30 MG TABLET    Take 30 mg by mouth daily.   SUCROFERRIC OXYHYDROXIDE (VELPHORO) 500 MG CHEWABLE TABLET    Chew 1,500 mg by mouth 3 (three) times daily with meals.  Modified Medications   No medications on file  Discontinued Medications   No medications on file    Subjective: 32 year old female with prior history  of asthma, type I DM complicated with retinopathy, ESRD on HD, HTN, morbid obesity, MSSA bacteremia in 06/2021 related to HD catheter, anxiety and depression who is here for MSSA bacteremia secondary to infected AV graft. On 6/4, had removal of infected left upper arm AV graft/Vein patch angioplasty of the left brachial artery and Ultrasound-guided placement of temporary dialysis catheter. Per OR notes, complete graft seems to have removed. OR cx with MSSA. Blood cultures cleared on 6/5.  6/10 had  ultrasound guided, fluroscopic guided tunneled dialysis catheter was placed. 6/5 TTE and 6/11 TEE negative for vegetations or endocarditis. Patient was discharged on 6/11 with plan to complete 4 weeks of cefazolin with HD through 7/3 after date of removal of infected graft on 6/4.   07/26/22 Seen by Internal medicine  6/18  and vascular 6/20 where sutures were removed, plan for vein mapping in rt arm for new access once wound in left arm heals completely ( note pending).  She has completed last dose if IV cefazolin with HD on 7/3. She had a CT abdomen pelvis done on 6/18 for pre transplant evaluation which showed Bibasilar cavitary lesions with trace left pleural effusion. Findings most likely represent an infectious etiology. She has a CT chest ordered for 7/12. She is not sure regarding the fu. Reports doing well. Denies fevers, chills. Denies cough, chest pain and SOB but had mild cough with mucus before admission. She has a  Fu with Vascular 7/25. Wound in the left AVG has almost healed with no concerns like redness, swelling and tenderness.   Review of Systems: Denies nausea, vomiting, diarrhea. Denies rashes, joint or back pain.  headache, blurry vision  Past Medical History:  Diagnosis Date   Asthma    as a child, no problems as an adult, no inhaler   Cataract    NS OU   Chronic hypertension during pregnancy, antepartum 08/19/2017   Dehydration 01/28/2018   Depression during pregnancy, antepartum 07/07/2017   6/20: Short trial of zoloft previously, reports didn't help much but also didn't give it a chance Discussed r/b/a SSRIs in pregnancy, agrees to try Zoloft again, rx sent No SI/HI/red flags   Diabetes (HCC)    TYPE I. 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   ESRD on peritoneal dialysis (HCC)    HTN (hypertension)    Hypertensive retinopathy    OU   Hypokalemia 01/22/2018   Hypomagnesemia 01/28/2018   Intractable nausea and vomiting 01/22/2018   Intrauterine growth restriction (IUGR) affecting care of mother 12/22/2017   Morbid obesity (HCC)    Nephropathy, diabetic (HCC) 12/29/2017   Severe hyperemesis gravidarum 10/30/2017   Type I diabetes mellitus (HCC) 07/07/2017   Current Diabetic  Medications:  Insulin  [x]  Aspirin 81 mg daily after 12 weeks (? A2/B GDM)  Required Referrals for A1GDM or A2GDM: [x]  Diabetes Education and Testing Supplies [x]  Nutrition Cousult  For A2/B GDM or higher classes of DM [x]  Diabetes Education and Testing Supplies [x]  Nutrition Counsult [x]  Fetal ECHO after 20 weeks  [x]  Eye exam for retina evaluation - severe retinopathy 7/19  Base   Ventricular septal defect (VSD) of fetus in singleton pregnancy, antepartum 09/30/2017   May go to newborn nursery per Dr. [place] prior to discharge   Past Surgical History:  Procedure Laterality Date   25 GAUGE PARS PLANA VITRECTOMY WITH 20 GAUGE MVR PORT FOR MACULAR HOLE Left 07/20/2018  Procedure: 25 GAUGE PARS PLANA VITRECTOMY LEFT EYE;  Surgeon: Rennis Chris, MD;  Location: [facility] OR;  Service: Ophthalmology;  Laterality: Left;   AV FISTULA PLACEMENT Left 08/11/2021   Procedure: INSERTION OF LEFT ARM ARTERIOVENOUS (AV) GORE-TEX GRAFT;  Surgeon: Victorino Sparrow, MD;  Location: [facility] OR;  Service: Vascular;  Laterality: Left;  PERIPHERAL NERVE BLOCK   AVGG REMOVAL Left 06/22/2022   Procedure: REMOVAL OF ARTERIOVENOUS GORETEX GRAFT (AVGG) LEFT ARM WITH VEIN PATCH OF BRACHIAL ARTERY;  Surgeon: Chuck Hint, MD;  Location: [facility] OR;  Service: Vascular;  Laterality: Left;   CAPD INSERTION N/A 09/10/2020   Procedure: LAPAROSCOPIC INSERTION CONTINUOUS AMBULATORY PERITONEAL DIALYSIS  (CAPD) CATHETER;  Surgeon: Nada Libman, MD;  Location: [facility] OR;  Service: Vascular;  Laterality: N/A;   CESAREAN SECTION N/A 12/26/2017   Procedure: CESAREAN SECTION;  Surgeon: Tereso Newcomer, MD;  [facility];  Service: Obstetrics;  Laterality: N/A;   EYE EXAMINATION UNDER ANESTHESIA Right 07/20/2018   Procedure: Eye Exam Under Anesthesia RIGHT EYE;  Surgeon: Rennis Chris, MD;  Location: [facility] OR;  Service: Ophthalmology;  Laterality: Right;   EYE SURGERY Left 07/2018   GAS INSERTION Left 07/19/2019   Procedure: INSERTION OF  GAS;  Surgeon: Rennis Chris, MD;  Location: [facility] OR;  Service: Ophthalmology;  Laterality: Left;  SF6   INJECTION OF SILICONE OIL Left 07/20/2018   Procedure: Injection Of Silicone Oil LEFT EYE;  Surgeon: Rennis Chris, MD;  Location: [facility] OR;  Service: Ophthalmology;  Laterality: Left;   INSERTION OF DIALYSIS CATHETER Right 07/05/2021   Procedure: REMOVAL OF DIALYSIS CATHETER;  Surgeon: Leonie Douglas, MD;  Location: [facility] OR;  Service: Vascular;  Laterality: Right;   INSERTION OF DIALYSIS CATHETER Right 07/09/2021   Procedure: INSERTION OF TUNNELED DIALYSIS CATHETER;  Surgeon: Leonie Douglas, MD;  Location: [facility] OR;  Service: Vascular;  Laterality: Right;   INSERTION OF DIALYSIS CATHETER Left 07/09/2021   Procedure: INSERTION OF TUNNELED  DIALYSIS CATHETER;  Surgeon: Leonie Douglas, MD;  Location: [facility] OR;  Service: Vascular;  Laterality: Left;   INSERTION OF DIALYSIS CATHETER N/A 06/22/2022   Procedure: INSERTION OF LEFT INTERNAL JUGULAR TRIALYSIS DIALYSIS CATHETER;  Surgeon: Chuck Hint, MD;  Location: [facility] OR;  Service: Vascular;  Laterality: N/A;   INSERTION OF DIALYSIS CATHETER Left 06/28/2022   Procedure: ULTRASOUND GUIDED INSERTION OF TUNNELED DIALYSIS CATHETER;  Surgeon: Leonie Douglas, MD;  Location: [facility] OR;  Service: Vascular;  Laterality: Left;   IR FLUORO GUIDE CV LINE RIGHT  06/02/2020   IR US GUIDE VASC ACCESS RIGHT  06/02/2020   LASER PHOTO ABLATION Right 07/20/2018   Procedure: Laser Photo Ablation RIGHT EYE;  Surgeon: Rennis Chris, MD;  Location: [facility] OR;  Service: Ophthalmology;  Laterality: Right;   MEMBRANE PEEL Left 07/20/2018   Procedure: Membrane Peel LEFT EYE;  Surgeon: Rennis Chris, MD;  Location: [facility] OR;  Service: Ophthalmology;  Laterality: Left;   MEMBRANE PEEL Left 07/19/2019   Procedure: MEMBRANE PEEL;  Surgeon: Rennis Chris, MD;  Location: [facility] OR;  Service: Ophthalmology;  Laterality: Left;   MITOMYCIN C APPLICATION Bilateral 07/20/2018   Procedure: Avastin Application;   Surgeon: Rennis Chris, MD;  Location: [facility] OR;  Service: Ophthalmology;  Laterality: Bilateral;   PHOTOCOAGULATION WITH LASER Left 07/20/2018   Procedure: Photocoagulation With Laser LEFT EYE;  Surgeon: Rennis Chris, MD;  Location: [facility] OR;  Service: Ophthalmology;  Laterality: Left;   PHOTOCOAGULATION WITH LASER Left 07/19/2019   Procedure: PHOTOCOAGULATION  WITH LASER;  Surgeon: Rennis Chris, MD;  Location: [facility] OR;  Service: Ophthalmology;  Laterality: Left;   RETINAL DETACHMENT SURGERY Left 07/20/2018   Dr. Rennis Chris   SILICON OIL REMOVAL Left 07/19/2019   Procedure: 25g PARS PLANA VITRECTOMY WITH SILICON OIL REMOVAL;  Surgeon: Rennis Chris, MD;  Location: [facility] OR;  Service: Ophthalmology;  Laterality: Left;   TEE WITHOUT CARDIOVERSION N/A 07/08/2021   Procedure: TRANSESOPHAGEAL ECHOCARDIOGRAM (TEE);  Surgeon: Little Ishikawa, MD;  Location: [facility] ENDOSCOPY;  Service: Cardiovascular;  Laterality: N/A;   TEE WITHOUT CARDIOVERSION N/A 06/29/2022   Procedure: TRANSESOPHAGEAL ECHOCARDIOGRAM;  Surgeon: Wendall Stade, MD;  Location: [facility] INVASIVE CV LAB;  Service: Cardiovascular;  Laterality: N/A;   WISDOM TOOTH EXTRACTION      Social History   Tobacco Use   Smoking status: Never   Smokeless tobacco: Never  Vaping Use   Vaping Use: Never used  Substance Use Topics   Alcohol use: Not Currently   Drug use: Never    Family History  Problem Relation Age of Onset   Diabetes Mother    Aneurysm Mother 66   Seizures Mother    Diabetes Father    Cataracts Father    COPD Father    Heart attack Father    Heart disease Father    Healthy Sister    Healthy Daughter    Stroke Maternal Grandfather    Amblyopia Neg Hx    Blindness Neg Hx    Glaucoma Neg Hx    Macular degeneration Neg Hx    Retinal detachment Neg Hx    Strabismus Neg Hx    Retinitis pigmentosa Neg Hx    Colon cancer Neg Hx    Stomach cancer Neg Hx    Esophageal cancer Neg Hx    Pancreatic cancer Neg Hx    Liver disease Neg  Hx     No Known Allergies  Health Maintenance  Topic Date Due   COVID-19 Vaccine (3 - Mixed Product risk series) 09/30/2018   OPHTHALMOLOGY EXAM  02/18/2022   INFLUENZA VACCINE  08/19/2022   FOOT EXAM  10/14/2022   HEMOGLOBIN A1C  12/22/2022   Medicare Annual Wellness (AWV)  07/06/2023   PAP SMEAR-Modifier  03/11/2024   DTaP/Tdap/Td (2 - Td or Tdap) 11/15/2027   Hepatitis C Screening  Completed   HIV Screening  Completed   HPV VACCINES  Aged Out    Objective: BP (!) 199/109   Pulse 83   Temp 97.8 F (36.6 C) (Oral)   Wt 150 lb (68 kg)   SpO2 100%   BMI 24.21 kg/m    Physical Exam Constitutional:      Appearance: Normal appearance.  HENT:     Head: Normocephalic and atraumatic.      Mouth: Mucous membranes are moist.  Eyes:    Conjunctiva/sclera: Conjunctivae normal.     Pupils: Pupils are equal, round, and bilaterally symmetrical    Cardiovascular:     Rate and Rhythm: Normal rate and regular rhythm.     Heart sounds: s1 and s2  Pulmonary:     Effort: Pulmonary effort is normal.     Breath sounds: Normal breath sounds.   Abdominal:     General: Non distended     Palpations: soft.   Musculoskeletal:        General: Normal range of motion.   Skin:    General: Skin is warm and dry.     Comments:left arm wound has almost healed with  no signs of infection. Left chest HD catheter with no concerns   Neurological:     General: grossly non focal     Mental Status: awake, alert and oriented to person, place, and time.   Psychiatric:        Mood and Affect: Mood normal.   Lab Results Lab Results  Component Value Date   WBC 8.7 06/29/2022   HGB 7.8 (L) 06/29/2022   HCT 24.5 (L) 06/29/2022   MCV 90.1 06/29/2022   PLT 579 (H) 06/29/2022    Lab Results  Component Value Date   CREATININE 10.38 (H) 06/29/2022   BUN 53 (H) 06/29/2022   NA 134 (L) 06/29/2022   K 5.2 (H) 06/29/2022   CL 95 (L) 06/29/2022   CO2 22 06/29/2022    Lab Results  Component  Value Date   ALT 33 06/22/2022   AST 20 06/22/2022   ALKPHOS 99 06/22/2022   BILITOT 0.8 06/22/2022    Lab Results  Component Value Date   TRIG 149 04/11/2020   Lab Results  Component Value Date   LABRPR Non Reactive 12/26/2017   No results found for: "HIV1RNAQUANT", "HIV1RNAVL", "CD4TABS" Microbiology Results for orders placed or performed during the hospital encounter of 06/22/22  Culture, blood (Routine x 2)     Status: Abnormal   Collection Time: 06/22/22  7:37 AM   Specimen: BLOOD RIGHT ARM  Result Value Ref Range Status   Specimen Description BLOOD RIGHT ARM  Final   Special Requests   Final    BOTTLES DRAWN AEROBIC AND ANAEROBIC Blood Culture adequate volume   Culture  Setup Time   Final    GRAM POSITIVE COCCI IN CLUSTERS IN BOTH AEROBIC AND ANAEROBIC BOTTLES CRITICAL RESULT CALLED TO, READ BACK BY AND VERIFIED WITH: PHARMD JAMES LEDFORD ON 06/22/22 @ 2239 BY DRT Performed at [facility] Lab, [address], [place], [place] [address]    Culture STAPHYLOCOCCUS AUREUS (A)  Final   Report Status 06/24/2022 FINAL  Final   Organism ID, Bacteria STAPHYLOCOCCUS AUREUS  Final      Susceptibility   Staphylococcus aureus - MIC*    CIPROFLOXACIN <=0.5 SENSITIVE Sensitive     ERYTHROMYCIN >=8 RESISTANT Resistant     GENTAMICIN <=0.5 SENSITIVE Sensitive     OXACILLIN <=0.25 SENSITIVE Sensitive     TETRACYCLINE >=16 RESISTANT Resistant     VANCOMYCIN 1 SENSITIVE Sensitive     TRIMETH/SULFA <=10 SENSITIVE Sensitive     CLINDAMYCIN <=0.25 SENSITIVE Sensitive     RIFAMPIN <=0.5 SENSITIVE Sensitive     Inducible Clindamycin NEGATIVE Sensitive     LINEZOLID 2 SENSITIVE Sensitive     * STAPHYLOCOCCUS AUREUS  Blood Culture ID Panel (Reflexed)     Status: Abnormal   Collection Time: 06/22/22  7:37 AM  Result Value Ref Range Status   Enterococcus faecalis NOT DETECTED NOT DETECTED Final   Enterococcus Faecium NOT DETECTED NOT DETECTED Final   Listeria monocytogenes NOT DETECTED  NOT DETECTED Final   Staphylococcus species DETECTED (A) NOT DETECTED Final    Comment: CRITICAL RESULT CALLED TO, READ BACK BY AND VERIFIED WITH: PHARMD JAMES LEDFORD ON 06/22/22 @ 2239 BY DRT    Staphylococcus aureus (BCID) DETECTED (A) NOT DETECTED Final    Comment: CRITICAL RESULT CALLED TO, READ BACK BY AND VERIFIED WITH: PHARMD JAMES LEDFORD ON 06/22/22 @ 2239 BY DRT    Staphylococcus epidermidis NOT DETECTED NOT DETECTED Final   Staphylococcus lugdunensis NOT DETECTED NOT DETECTED Final  Streptococcus species NOT DETECTED NOT DETECTED Final   Streptococcus agalactiae NOT DETECTED NOT DETECTED Final   Streptococcus pneumoniae NOT DETECTED NOT DETECTED Final   Streptococcus pyogenes NOT DETECTED NOT DETECTED Final   A.calcoaceticus-baumannii NOT DETECTED NOT DETECTED Final   Bacteroides fragilis NOT DETECTED NOT DETECTED Final   Enterobacterales NOT DETECTED NOT DETECTED Final   Enterobacter cloacae complex NOT DETECTED NOT DETECTED Final   Escherichia coli NOT DETECTED NOT DETECTED Final   Klebsiella aerogenes NOT DETECTED NOT DETECTED Final   Klebsiella oxytoca NOT DETECTED NOT DETECTED Final   Klebsiella pneumoniae NOT DETECTED NOT DETECTED Final   Proteus species NOT DETECTED NOT DETECTED Final   Salmonella species NOT DETECTED NOT DETECTED Final   Serratia marcescens NOT DETECTED NOT DETECTED Final   Haemophilus influenzae NOT DETECTED NOT DETECTED Final   Neisseria meningitidis NOT DETECTED NOT DETECTED Final   Pseudomonas aeruginosa NOT DETECTED NOT DETECTED Final   Stenotrophomonas maltophilia NOT DETECTED NOT DETECTED Final   Candida albicans NOT DETECTED NOT DETECTED Final   Candida auris NOT DETECTED NOT DETECTED Final   Candida glabrata NOT DETECTED NOT DETECTED Final   Candida krusei NOT DETECTED NOT DETECTED Final   Candida parapsilosis NOT DETECTED NOT DETECTED Final   Candida tropicalis NOT DETECTED NOT DETECTED Final   Cryptococcus neoformans/gattii NOT DETECTED  NOT DETECTED Final   Meth resistant mecA/C and MREJ NOT DETECTED NOT DETECTED Final    Comment: Performed at [facility] Lab, [address], [place], [place] [address]  Aerobic/Anaerobic Culture w Gram Stain (surgical/deep wound)     Status: None   Collection Time: 06/22/22 10:52 AM   Specimen: Wound; Body Fluid  Result Value Ref Range Status   Specimen Description FLUID  Final   Special Requests LEFT ARM PERI GRAFT  Final   Gram Stain   Final    MODERATE WBC PRESENT, PREDOMINANTLY PMN RARE SQUAMOUS EPITHELIAL CELLS PRESENT FEW GRAM POSITIVE COCCI    Culture   Final    FEW STAPHYLOCOCCUS AUREUS NO ANAEROBES ISOLATED Performed at [facility] Lab, [address], [place], [place] [address]    Report Status 06/27/2022 FINAL  Final   Organism ID, Bacteria STAPHYLOCOCCUS AUREUS  Final      Susceptibility   Staphylococcus aureus - MIC*    CIPROFLOXACIN <=0.5 SENSITIVE Sensitive     ERYTHROMYCIN >=8 RESISTANT Resistant     GENTAMICIN <=0.5 SENSITIVE Sensitive     OXACILLIN <=0.25 SENSITIVE Sensitive     TETRACYCLINE >=16 RESISTANT Resistant     VANCOMYCIN <=0.5 SENSITIVE Sensitive     TRIMETH/SULFA <=10 SENSITIVE Sensitive     CLINDAMYCIN <=0.25 SENSITIVE Sensitive     RIFAMPIN <=0.5 SENSITIVE Sensitive     Inducible Clindamycin NEGATIVE Sensitive     LINEZOLID 2 SENSITIVE Sensitive     * FEW STAPHYLOCOCCUS AUREUS  Culture, blood (Routine X 2) w Reflex to ID Panel     Status: None   Collection Time: 06/23/22  8:03 AM   Specimen: BLOOD  Result Value Ref Range Status   Specimen Description BLOOD RIGHT ANTECUBITAL  Final   Special Requests   Final    BOTTLES DRAWN AEROBIC AND ANAEROBIC Blood Culture results may not be optimal due to an inadequate volume of blood received in culture bottles   Culture   Final    NO GROWTH 5 DAYS Performed at [facility] Lab, [address] [place], [place] [address]    Report Status 06/28/2022 FINAL  Final  Culture, blood (Routine X 2) w  Reflex to ID Panel     Status: None   Collection Time: 06/23/22  8:05 AM   Specimen: BLOOD RIGHT FOREARM  Result Value Ref Range Status   Specimen Description BLOOD RIGHT FOREARM  Final   Special Requests   Final    BOTTLES DRAWN AEROBIC AND ANAEROBIC Blood Culture adequate volume   Culture   Final    NO GROWTH 5 DAYS Performed at [facility], [address], [place], [place] [address]    Report Status 06/28/2022 FINAL  Final  Culture, blood (Routine X 2) w Reflex to ID Panel     Status: None   Collection Time: 06/25/22  4:06 AM   Specimen: BLOOD  Result Value Ref Range Status   Specimen Description BLOOD BLOOD RIGHT ARM  Final   Special Requests   Final    BOTTLES DRAWN AEROBIC AND ANAEROBIC Blood Culture adequate volume   Culture   Final    NO GROWTH 5 DAYS Performed at [facility] Lab, [address], [place], [place] [address]    Report Status 06/30/2022 FINAL  Final  Culture, blood (Routine X 2) w Reflex to ID Panel     Status: None   Collection Time: 06/25/22  4:07 AM   Specimen: BLOOD RIGHT HAND  Result Value Ref Range Status   Specimen Description BLOOD RIGHT HAND  Final   Special Requests   Final    BOTTLES DRAWN AEROBIC AND ANAEROBIC Blood Culture adequate volume   Culture   Final    NO GROWTH 5 DAYS Performed at [facility], [address], [place], [place] [address]    Report Status 06/30/2022 FINAL  Final    6/17 CT abdomen/pelvis  IMPRESSION:  1. The kidneys are normal in size but poorly enhancing consistent with chronic renal disease. There are no renal masses.  2. The abdominal aorta is normal caliber. There are no significant atherosclerotic calcifications involving the common or external iliac arteries bilaterally peer  3. Large amount of fecal material within the colon suggesting constipation.  4. Bibasilar cavitary lesions with trace left pleural effusion. Findings most likely represent an infectious etiology.    Assessment/plan 32 year old female with prior history of asthma type I DM complicated with retinopathy, ESRD on HD, HTN, morbid obesity, MSSA bacteremia in 06/2021 related to HD catheter, anxiety/depressionwho is here for MSSA bacteremia secondary to infected AV graft.   # Complicated MSSA bacteremia 2/2 infected AVG 6/4 s/p complete excision of AVG and Vein patch angioplasty of the left brachial artery. OR cx with MSSA Blood cx cleared on 6/5 6/10 new HD catheter placed  6/5 TTE and 6/11 TEE negative for vegetations or endocarditis. Completed 4 weeks of IV cefazolin on 7/3 6/18 CT chest with bibasilar cavitary lesions, suspect MSSA related, no prior CT chest during hospital admission  Restart cefazolin 2g w each HD MWF CBC and BMP every week Fu in 2 weeks after CT chest done, scheduled for 7/12  I have personally spent 55 minutes involved in face-to-face and non-face-to-face activities for this patient on the day of the visit. Professional time spent includes the following activities: Preparing to see the patient (review of tests), Obtaining and/or reviewing separately obtained history (admission/discharge record), Performing a medically appropriate examination and/or evaluation , Ordering medications/tests/procedures, referring and communicating with other health care professionals, Documenting clinical information in the EMR, Independently interpreting results (not separately reported), Communicating results to  the patient/family/caregiver, Counseling and educating the patient/family/caregiver and Care coordination (not separately reported).   Victoriano Lain, MD [facility] 07/26/2022, 1:18 PM

## 2022-07-26 NOTE — Telephone Encounter (Signed)
Per Dr. Elinor Parkinson called [facility] to resume Cefazolin at dailysis. Spoke with [facility] who requested order be faxed.  Patient next appt is 7/10. Orders faxed successfully.  P: [phone] W:[phone] Juanita Laster, RMA

## 2022-07-29 ENCOUNTER — Ambulatory Visit: Payer: 59

## 2022-07-29 ENCOUNTER — Encounter (HOSPITAL_COMMUNITY): Payer: 59

## 2022-08-11 ENCOUNTER — Encounter: Payer: 59 | Admitting: Obstetrics and Gynecology

## 2022-08-12 ENCOUNTER — Ambulatory Visit (INDEPENDENT_AMBULATORY_CARE_PROVIDER_SITE_OTHER): Payer: 59 | Admitting: Infectious Diseases

## 2022-08-12 ENCOUNTER — Encounter: Payer: Self-pay | Admitting: Infectious Diseases

## 2022-08-12 ENCOUNTER — Other Ambulatory Visit: Payer: Self-pay

## 2022-08-12 ENCOUNTER — Ambulatory Visit: Payer: 59

## 2022-08-12 ENCOUNTER — Ambulatory Visit (HOSPITAL_COMMUNITY)
Admission: RE | Admit: 2022-08-12 | Discharge: 2022-08-12 | Disposition: A | Discharge status: Home / Self Care | Payer: 59 | Source: Ambulatory Visit | Attending: Vascular Surgery | Admitting: Vascular Surgery

## 2022-08-12 ENCOUNTER — Telehealth: Payer: Self-pay

## 2022-08-12 VITALS — BP 179/100 | HR 77 | Temp 98.6°F | Resp 18 | Ht 66.0 in | Wt 147.6 lb

## 2022-08-12 VITALS — BP 189/101 | HR 89 | Resp 16 | Ht 66.0 in | Wt 142.0 lb

## 2022-08-12 DIAGNOSIS — N186 End stage renal disease: Secondary | ICD-10-CM | POA: Diagnosis present

## 2022-08-12 DIAGNOSIS — Z992 Dependence on renal dialysis: Secondary | ICD-10-CM | POA: Insufficient documentation

## 2022-08-12 DIAGNOSIS — B9561 Methicillin susceptible Staphylococcus aureus infection as the cause of diseases classified elsewhere: Secondary | ICD-10-CM

## 2022-08-12 DIAGNOSIS — R7881 Bacteremia: Secondary | ICD-10-CM

## 2022-08-12 DIAGNOSIS — T827XXD Infection and inflammatory reaction due to other cardiac and vascular devices, implants and grafts, subsequent encounter: Secondary | ICD-10-CM

## 2022-08-12 DIAGNOSIS — Z79899 Other long term (current) drug therapy: Secondary | ICD-10-CM

## 2022-08-12 DIAGNOSIS — J984 Other disorders of lung: Secondary | ICD-10-CM

## 2022-08-12 NOTE — Telephone Encounter (Signed)
Called :  [facility], [place] [facility] [place]  Advised to STOP antibiotics.  Spoke to: [employer]

## 2022-08-12 NOTE — Progress Notes (Signed)
Established Dialysis Access   History of Present Illness   Barbara Donovan is a 32 y.o. (1991/01/04) female who presents for re-evaluation for permanent access.  The patient is right hand dominant.  She recently had left upper arm graft removed on 06/22/22  by Dr. Edilia Bo due to infection. Her left arm is now healed. She denies any pain, weakness, numbness in left arm or hand.   She currently dialyzes via right internal jugular TDC at the [facility] location on MWF  Current Outpatient Medications  Medication Sig Dispense Refill   acetaminophen (TYLENOL) 325 MG tablet Take 650 mg by mouth every 6 (six) hours as needed for mild pain.     amLODipine (NORVASC) 10 MG tablet Take 1 tablet (10 mg total) by mouth daily. (Patient taking differently: Take 10 mg by mouth every evening.) 90 tablet 1   calcitRIOL (ROCALTROL) 0.5 MCG capsule Take 0.5 mcg by mouth every Monday, Wednesday, and Friday with hemodialysis.     carvedilol (COREG) 25 MG tablet Take 1 tablet (25 mg total) by mouth 2 (two) times daily. 180 tablet 3   Chlorhexidine Gluconate Cloth 2 % PADS Apply 6 each topically daily at 6 (six) AM. 120 each 0   Continuous Blood Gluc Receiver DEVI 1 Units by Does not apply route 4 (four) times daily -  before meals and at bedtime. May substitute for cheapest monitor with insurance 1 Units 0   Continuous Blood Gluc Sensor (DEXCOM G7 SENSOR) MISC 1 Device by Does not apply route as directed. 9 each 3   ethyl chloride spray Apply 1 Application topically every Monday, Wednesday, and Friday with hemodialysis. Apply to arm     glucose blood (FREESTYLE TEST STRIPS) test strip Use as instructed 100 each 0   hydrALAZINE (APRESOLINE) 50 MG tablet TAKE 1 TAB TWICE A DAY, MAY TAKE 1 EXTRA TAB DAILY ONLY AS NEEDED FOR ELEVATED BLOOD PRESSURE 90 tablet 0   insulin glargine (LANTUS SOLOSTAR) 100 UNIT/ML Solostar Pen Inject 18 Units into the skin every morning. (Patient taking differently: Inject 16 Units into the  skin every morning.) 15 mL 6   insulin lispro (HUMALOG KWIKPEN) 100 UNIT/ML KwikPen Inject 6 Units into the skin 2 (two) times daily with a meal. Max daily 35 units 30 mL 3   Insulin Pen Needle 32G X 4 MM MISC 1 Device by Does not apply route in the morning, at noon, in the evening, and at bedtime. 400 each 3   metoCLOPramide (REGLAN) 10 MG tablet Take 1 tablet (10 mg total) by mouth every 6 (six) hours. (Patient taking differently: Take 10 mg by mouth every 6 (six) hours as needed for nausea or vomiting.) 30 tablet 0   mirtazapine (REMERON) 15 MG tablet Take 15 mg by mouth at bedtime.     multivitamin (RENA-VIT) TABS tablet Take 1 tablet by mouth at bedtime. 90 tablet 1   ondansetron (ZOFRAN-ODT) 8 MG disintegrating tablet Take 1 tablet (8 mg total) by mouth every 8 (eight) hours as needed for nausea or vomiting. 20 tablet 0   SENSIPAR 30 MG tablet Take 30 mg by mouth daily.     sucroferric oxyhydroxide (VELPHORO) 500 MG chewable tablet Chew 1,500 mg by mouth 3 (three) times daily with meals.     No current facility-administered medications for this visit.    On ROS today: negative unless stated in HPI   Physical Examination   Vitals:   08/12/22 0858  BP: (!) 179/100  Pulse:  77  Resp: 18  Temp: 98.6 F (37 C)  TempSrc: Temporal  SpO2: 97%  Weight: 147 lb 9.6 oz (67 kg)  Height: 5\' 6"  (1.676 m)   Body mass index is 23.82 kg/m.  General Alert and oriented, in no acute distress  Pulmonary Non labored  Cardiac Regular rate and rhythm  Vascular Vessel Right Left  Radial Palpable Palpable  Brachial Palpable Palpable    Musculo- skeletal M/S 5/5 throughout  , Extremities without ischemic changes    Neurologic A&O; CN grossly intact     Non-invasive Vascular Imaging   BUE Vein Mapping  (08/12/22):  +-----------------+-------------+----------+----------------------+  Right Cephalic   Diameter (cm)Depth (cm)       Findings          +-----------------+-------------+----------+----------------------+  Prox upper arm       0.21        0.75          occluded         +-----------------+-------------+----------+----------------------+  Mid upper arm        0.25        0.56          occluded         +-----------------+-------------+----------+----------------------+  Dist upper arm       0.27        0.39                           +-----------------+-------------+----------+----------------------+  Antecubital fossa    0.26        0.43   branching and occluded  +-----------------+-------------+----------+----------------------+  Prox forearm         0.08        0.46                           +-----------------+-------------+----------+----------------------+  Mid forearm          0.06        0.47                           +-----------------+-------------+----------+----------------------+  Dist forearm         0.06        0.27         branching         +-----------------+-------------+----------+----------------------+   +-----------------+-------------+----------+---------+  Right Basilic    Diameter (cm)Depth (cm)Findings   +-----------------+-------------+----------+---------+  Prox upper arm       0.42        1.45              +-----------------+-------------+----------+---------+  Mid upper arm        0.36        1.05              +-----------------+-------------+----------+---------+  Dist upper arm       0.27        0.97              +-----------------+-------------+----------+---------+  Antecubital fossa    0.28        0.61              +-----------------+-------------+----------+---------+  Prox forearm         0.12        0.30   branching  +-----------------+-------------+----------+---------+  Mid forearm          0.20  0.44              +-----------------+-------------+----------+---------+  Distal forearm       0.12        0.17               +-----------------+-------------+----------+---------+     Medical Decision Making   Barbara Donovan is a 32 y.o. female who presents for re- evaluation of permanent dialysis access. She recently had left upper arm graft removed on 06/22/22  by Dr. Edilia Bo due to infection. Her left arm is now healed.  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 She will likely need a right upper arm AVG but we will look at the basilic vein at the time of surgery Risk, benefits, and alternatives to access surgery were discussed.  The patient is aware the risks include but are not limited to: bleeding, infection, steal syndrome, nerve damage, thrombosis, failure to mature, and need for additional procedures. The patient agrees to proceed with the procedure Will arrange right upper arm AVF vs AVG in the near future with Dr.Dickson. She dialyzes on MWF so will try to arrange this on an non dialysis day   Graceann Congress, PA-C [facility] [place] Office: [phone]  Clinic MD: Edilia Bo

## 2022-08-12 NOTE — Progress Notes (Signed)
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) 10/13/2021   Greater trochanteric pain syndrome of right lower extremity 07/23/2021   MSSA bacteremia 07/04/2021   Hyperlipidemia 04/20/2021   Anxiety and depression 02/04/2021   DKA, type 1 (HCC) 11/10/2020   Type 1 diabetes mellitus with hyperglycemia (HCC) 11/10/2020   ESRD on hemodialysis (HCC) 11/10/2020   Anemia due to stage 5 chronic kidney disease (HCC) 03/04/2020   Intractable nausea and vomiting 01/22/2018   Essential hypertension 08/21/2017   Diabetic retinopathy (HCC) 06/09/2017   Asthma 10/30/2013    Patient's Medications  New Prescriptions   No medications on file  Previous Medications   ACETAMINOPHEN (TYLENOL) 325 MG TABLET    Take 650 mg by mouth every 6 (six) hours as needed for mild pain.   AMLODIPINE (NORVASC) 10 MG TABLET    Take 1 tablet (10 mg total) by mouth daily.   CALCITRIOL (ROCALTROL) 0.5 MCG CAPSULE    Take 0.5 mcg by mouth every Monday, Wednesday, and Friday with hemodialysis.   CARVEDILOL (COREG) 25 MG TABLET    Take 1 tablet (25 mg total) by mouth 2 (two) times daily.   CHLORHEXIDINE GLUCONATE CLOTH 2 % PADS    Apply 6 each topically daily at 6 (six) AM.   CONTINUOUS BLOOD GLUC RECEIVER DEVI    1 Units by Does not apply route 4 (four) times daily -  before meals and at bedtime. May substitute for cheapest monitor with insurance   CONTINUOUS BLOOD GLUC SENSOR (DEXCOM G7 SENSOR) MISC    1 Device by Does not apply route as directed.   ETHYL CHLORIDE SPRAY    Apply 1 Application topically every Monday, Wednesday, and Friday with hemodialysis. Apply to arm   GLUCOSE BLOOD (FREESTYLE TEST STRIPS) TEST STRIP    Use as instructed   HYDRALAZINE (APRESOLINE) 50 MG TABLET    TAKE 1 TAB TWICE  A DAY, MAY TAKE 1 EXTRA TAB DAILY ONLY AS NEEDED FOR ELEVATED BLOOD PRESSURE   INSULIN GLARGINE (LANTUS SOLOSTAR) 100 UNIT/ML SOLOSTAR PEN    Inject 18 Units into the skin every morning.   INSULIN LISPRO (HUMALOG KWIKPEN) 100 UNIT/ML KWIKPEN    Inject 6 Units into the skin 2 (two) times daily with a meal. Max daily 35 units   INSULIN PEN NEEDLE 32G X 4 MM MISC    1 Device by Does not apply route in the morning, at noon, in the evening, and at bedtime.   METOCLOPRAMIDE (REGLAN) 10 MG TABLET    Take 1 tablet (10 mg total) by mouth every 6 (six) hours.   MIRTAZAPINE (REMERON) 15 MG TABLET    Take 15 mg by mouth at bedtime.   MULTIVITAMIN (RENA-VIT) TABS TABLET    Take 1 tablet by mouth at bedtime.   ONDANSETRON (ZOFRAN-ODT) 8 MG DISINTEGRATING TABLET    Take 1 tablet (8 mg total) by mouth every 8 (eight) hours as needed for nausea or vomiting.   SENSIPAR 30 MG TABLET    Take 30 mg by mouth daily.   SUCROFERRIC OXYHYDROXIDE (VELPHORO) 500 MG CHEWABLE TABLET    Chew 1,500 mg by mouth 3 (three) times daily with meals.  Modified Medications   No medications on file  Discontinued Medications   No medications  on file    Subjective: 32 year old female with prior history of asthma, type I DM complicated with retinopathy, ESRD on HD, HTN, morbid obesity, MSSA bacteremia in 06/2021 related to HD catheter, anxiety and depression who is here for MSSA bacteremia secondary to infected AV graft. On 6/4, had removal of infected left upper arm AV graft/Vein patch angioplasty of the left brachial artery and Ultrasound-guided placement of temporary dialysis catheter. Per OR notes, complete graft seems to have removed. OR cx with MSSA. Blood cultures cleared on 6/5.  6/10 had  ultrasound guided, fluroscopic guided tunneled dialysis catheter was placed. 6/5 TTE and 6/11 TEE negative for vegetations or endocarditis. Patient was discharged on 6/11 with plan to complete 4 weeks of cefazolin with HD through 7/3 after date of  removal of infected graft on 6/4.   07/26/22 Seen by Internal medicine  6/18 and vascular 6/20 where sutures were removed, plan for vein mapping in rt arm for new access once wound in left arm heals completely ( note pending).  She has completed last dose if IV cefazolin with HD on 7/3. She had a CT abdomen pelvis done on 6/18 for pre transplant evaluation which showed Bibasilar cavitary lesions with trace left pleural effusion. Findings most likely represent an infectious etiology. She has a CT chest ordered for 7/12. She is not sure regarding the fu. Reports doing well. Denies fevers, chills. Denies cough, chest pain and SOB but had mild cough with mucus before admission. She has a  Fu with Vascular 7/25. Wound in the left AVG has almost healed with no concerns like redness, swelling and tenderness.   08/12/22 Started on cefazolin with HD from 7/9.  She got HD on Tuesday Wednesday and Saturday that week as she had to miss HD on Friday 7/12 due to her transplant appointment.  She has been referred to GI by her transplant doctor prior to getting transplantation due to elevated amylase/lipase.  She was also seen by vascular this morning for AVG versus AVF placement for permanent access.  She got her last dose of cefazolin with HD yesterday.  Denies fever, chills.  Denies nausea, vomiting or diarrhea.  Denies any chest pain, cough or shortness of breath.  Left arm wound has healed with no concerns.   Review of Systems: all systems reviewed with pertinent positives and negatives as listed above   Past Medical History:  Diagnosis Date   Asthma    as a child, no problems as an adult, no inhaler   Cataract    NS OU   Chronic hypertension during pregnancy, antepartum 08/19/2017   Dehydration 01/28/2018   Depression during pregnancy, antepartum 07/07/2017   6/20: Short trial of zoloft previously, reports didn't help much but also didn't give it a chance Discussed r/b/a SSRIs in pregnancy, agrees to try  Zoloft again, rx sent No SI/HI/red flags   Diabetes (HCC)    TYPE I. 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 A2/B GDM)  Required Referrals for A1GDM or A2GDM: [x]  Diabetes Education and Testing Supplies [x]  Nutrition Cousult  For A2/B GDM or higher classes of DM [x]  Diabetes Education and Testing Supplies [x]  Nutrition Counsult [x]  Fetal ECHO after 20 weeks  [x]  Eye exam for retina evaluation - severe retinopathy 7/19  Base   Ventricular septal defect (VSD) of fetus in singleton pregnancy, antepartum 09/30/2017   May go to newborn nursery per Dr. [place] Bing Echo prior to discharge   Past Surgical History:  Procedure Laterality Date   25 GAUGE PARS PLANA VITRECTOMY WITH 20 GAUGE MVR PORT FOR MACULAR HOLE Left 07/20/2018   Procedure: 25 GAUGE PARS PLANA VITRECTOMY LEFT EYE;  Surgeon: Rennis Chris, MD;  Location: [facility] OR;  Service: Ophthalmology;  Laterality: Left;   AV FISTULA PLACEMENT Left 08/11/2021   Procedure: INSERTION OF LEFT ARM ARTERIOVENOUS (AV) GORE-TEX GRAFT;  Surgeon: Victorino Sparrow, MD;  Location: [facility] OR;  Service: Vascular;  Laterality: Left;  PERIPHERAL NERVE BLOCK   AVGG REMOVAL Left 06/22/2022   Procedure: REMOVAL OF ARTERIOVENOUS GORETEX GRAFT (AVGG) LEFT ARM WITH VEIN PATCH OF BRACHIAL ARTERY;  Surgeon: Chuck Hint, MD;  Location: [facility] OR;  Service: Vascular;  Laterality:  Left;   CAPD INSERTION N/A 09/10/2020   Procedure: LAPAROSCOPIC INSERTION CONTINUOUS AMBULATORY PERITONEAL DIALYSIS  (CAPD) CATHETER;  Surgeon: Nada Libman, MD;  Location: [facility] OR;  Service: Vascular;  Laterality: N/A;   CESAREAN SECTION N/A 12/26/2017   Procedure: CESAREAN SECTION;  Surgeon: Tereso Newcomer, MD;  Location: [facility];  Service: Obstetrics;  Laterality: N/A;   EYE EXAMINATION UNDER ANESTHESIA Right 07/20/2018   Procedure: Eye Exam Under Anesthesia RIGHT EYE;  Surgeon: Rennis Chris, MD;  Location: [facility] OR;  Service: Ophthalmology;  Laterality: Right;   EYE SURGERY Left 07/2018   GAS INSERTION Left 07/19/2019   Procedure: INSERTION OF GAS;  Surgeon: Rennis Chris, MD;  Location: [facility] OR;  Service: Ophthalmology;  Laterality: Left;  SF6   INJECTION OF SILICONE OIL Left 07/20/2018   Procedure: Injection Of Silicone Oil LEFT EYE;  Surgeon: Rennis Chris, MD;  Location: [facility] OR;  Service: Ophthalmology;  Laterality: Left;   INSERTION OF DIALYSIS CATHETER Right 07/05/2021   Procedure: REMOVAL OF DIALYSIS CATHETER;  Surgeon: Leonie Douglas, MD;  Location: [facility] OR;  Service: Vascular;  Laterality: Right;   INSERTION OF DIALYSIS CATHETER Right 07/09/2021   Procedure: INSERTION OF TUNNELED DIALYSIS CATHETER;  Surgeon: Leonie Douglas, MD;  Location: [facility] OR;  Service: Vascular;  Laterality: Right;   INSERTION OF DIALYSIS CATHETER Left 07/09/2021   Procedure: INSERTION OF TUNNELED  DIALYSIS CATHETER;  Surgeon: Leonie Douglas, MD;  Location: [facility] OR;  Service: Vascular;  Laterality: Left;   INSERTION OF DIALYSIS CATHETER N/A 06/22/2022   Procedure: INSERTION OF LEFT INTERNAL JUGULAR TRIALYSIS DIALYSIS CATHETER;  Surgeon: Chuck Hint, MD;  Location: [facility] OR;  Service: Vascular;  Laterality: N/A;   INSERTION OF DIALYSIS CATHETER Left 06/28/2022   Procedure: ULTRASOUND GUIDED INSERTION OF TUNNELED DIALYSIS CATHETER;  Surgeon: Leonie Douglas, MD;  Location: [facility] OR;  Service: Vascular;   Laterality: Left;   IR FLUORO GUIDE CV LINE RIGHT  06/02/2020   IR US GUIDE VASC ACCESS RIGHT  06/02/2020   LASER PHOTO ABLATION Right 07/20/2018   Procedure: Laser Photo Ablation RIGHT EYE;  Surgeon: Rennis Chris, MD;  Location: [facility] OR;  Service: Ophthalmology;  Laterality: Right;  MEMBRANE PEEL Left 07/20/2018   Procedure: Membrane Peel LEFT EYE;  Surgeon: Rennis Chris, MD;  Location: [facility] OR;  Service: Ophthalmology;  Laterality: Left;   MEMBRANE PEEL Left 07/19/2019   Procedure: MEMBRANE PEEL;  Surgeon: Rennis Chris, MD;  Location: [facility] OR;  Service: Ophthalmology;  Laterality: Left;   MITOMYCIN C APPLICATION Bilateral 07/20/2018   Procedure: Avastin Application;  Surgeon: Rennis Chris, MD;  Location: [facility] OR;  Service: Ophthalmology;  Laterality: Bilateral;   PHOTOCOAGULATION WITH LASER Left 07/20/2018   Procedure: Photocoagulation With Laser LEFT EYE;  Surgeon: Rennis Chris, MD;  Location: [facility] OR;  Service: Ophthalmology;  Laterality: Left;   PHOTOCOAGULATION WITH LASER Left 07/19/2019   Procedure: PHOTOCOAGULATION WITH LASER;  Surgeon: Rennis Chris, MD;  Location: [facility] OR;  Service: Ophthalmology;  Laterality: Left;   RETINAL DETACHMENT SURGERY Left 07/20/2018   Dr. Rennis Chris   SILICON OIL REMOVAL Left 07/19/2019   Procedure: 25g PARS PLANA VITRECTOMY WITH SILICON OIL REMOVAL;  Surgeon: Rennis Chris, MD;  Location: [facility] OR;  Service: Ophthalmology;  Laterality: Left;   TEE WITHOUT CARDIOVERSION N/A 07/08/2021   Procedure: TRANSESOPHAGEAL ECHOCARDIOGRAM (TEE);  Surgeon: Little Ishikawa, MD;  Location: [facility] ENDOSCOPY;  Service: Cardiovascular;  Laterality: N/A;   TEE WITHOUT CARDIOVERSION N/A 06/29/2022   Procedure: TRANSESOPHAGEAL ECHOCARDIOGRAM;  Surgeon: Wendall Stade, MD;  Location: [facility] INVASIVE CV LAB;  Service: Cardiovascular;  Laterality: N/A;   WISDOM TOOTH EXTRACTION      Social History   Tobacco Use   Smoking status: Never   Smokeless tobacco: Never  Vaping Use   Vaping status:  Never Used  Substance Use Topics   Alcohol use: Not Currently   Drug use: Never    Family History  Problem Relation Age of Onset   Diabetes Mother    Aneurysm Mother 24   Seizures Mother    Diabetes Father    Cataracts Father    COPD Father    Heart attack Father    Heart disease Father    Healthy Sister    Healthy Daughter    Stroke Maternal Grandfather    Amblyopia Neg Hx    Blindness Neg Hx    Glaucoma Neg Hx    Macular degeneration Neg Hx    Retinal detachment Neg Hx    Strabismus Neg Hx    Retinitis pigmentosa Neg Hx    Colon cancer Neg Hx    Stomach cancer Neg Hx    Esophageal cancer Neg Hx    Pancreatic cancer Neg Hx    Liver disease Neg Hx     No Known Allergies  Health Maintenance  Topic Date Due   COVID-19 Vaccine (3 - Mixed Product risk series) 09/30/2018   OPHTHALMOLOGY EXAM  02/18/2022   INFLUENZA VACCINE  08/19/2022   FOOT EXAM  10/14/2022   HEMOGLOBIN A1C  12/22/2022   Medicare Annual Wellness (AWV)  07/06/2023   PAP SMEAR-Modifier  03/11/2024   DTaP/Tdap/Td (2 - Td or Tdap) 11/15/2027   Hepatitis C Screening  Completed   HIV Screening  Completed   HPV VACCINES  Aged Out    Objective: BP (!) 189/101   Pulse 89   Resp 16   Ht 5\' 6"  (1.676 m)   Wt 142 lb (64.4 kg)   SpO2 100%   BMI 22.92 kg/m   Physical Exam Constitutional:      Appearance: Normal appearance.  HENT:     Head: Normocephalic and atraumatic.      Mouth: Mucous  membranes are moist.  Eyes:    Conjunctiva/sclera: Conjunctivae normal.     Pupils: Pupils are equal, round, and bilaterally symmetrical    Cardiovascular:     Rate and Rhythm: Normal rate and regular rhythm.     Heart sounds: s1 and s2  Pulmonary:     Effort: Pulmonary effort is normal.     Breath sounds: Normal breath sounds.   Abdominal:     General: Non distended     Palpations: soft.   Musculoskeletal:        General: Normal range of motion.   Skin:    General: Skin is warm and dry.      Comments:left arm wound has healed with no signs of infection. Left chest HD catheter with no concerns   Neurological:     General: grossly non focal     Mental Status: awake, alert and oriented to person, place, and time.   Psychiatric:        Mood and Affect: Mood normal.   Lab Results Lab Results  Component Value Date   WBC 8.7 06/29/2022   HGB 7.8 (L) 06/29/2022   HCT 24.5 (L) 06/29/2022   MCV 90.1 06/29/2022   PLT 579 (H) 06/29/2022    Lab Results  Component Value Date   CREATININE 10.38 (H) 06/29/2022   BUN 53 (H) 06/29/2022   NA 134 (L) 06/29/2022   K 5.2 (H) 06/29/2022   CL 95 (L) 06/29/2022   CO2 22 06/29/2022    Lab Results  Component Value Date   ALT 33 06/22/2022   AST 20 06/22/2022   ALKPHOS 99 06/22/2022   BILITOT 0.8 06/22/2022    Lab Results  Component Value Date   TRIG 149 04/11/2020   Lab Results  Component Value Date   LABRPR Non Reactive 12/26/2017   No results found for: "HIV1RNAQUANT", "HIV1RNAVL", "CD4TABS" Microbiology Results for orders placed or performed during the hospital encounter of 06/22/22  Culture, blood (Routine x 2)     Status: Abnormal   Collection Time: 06/22/22  7:37 AM   Specimen: BLOOD RIGHT ARM  Result Value Ref Range Status   Specimen Description BLOOD RIGHT ARM  Final   Special Requests   Final    BOTTLES DRAWN AEROBIC AND ANAEROBIC Blood Culture adequate volume   Culture  Setup Time   Final    GRAM POSITIVE COCCI IN CLUSTERS IN BOTH AEROBIC AND ANAEROBIC BOTTLES CRITICAL RESULT CALLED TO, READ BACK BY AND VERIFIED WITH: PHARMD JAMES LEDFORD ON 06/22/22 @ 2239 BY DRT Performed at [facility] Lab, [address] [place], [place] [address]    Culture STAPHYLOCOCCUS AUREUS (A)  Final   Report Status 06/24/2022 FINAL  Final   Organism ID, Bacteria STAPHYLOCOCCUS AUREUS  Final      Susceptibility   Staphylococcus aureus - MIC*    CIPROFLOXACIN <=0.5 SENSITIVE Sensitive     ERYTHROMYCIN >=8 RESISTANT Resistant      GENTAMICIN <=0.5 SENSITIVE Sensitive     OXACILLIN <=0.25 SENSITIVE Sensitive     TETRACYCLINE >=16 RESISTANT Resistant     VANCOMYCIN 1 SENSITIVE Sensitive     TRIMETH/SULFA <=10 SENSITIVE Sensitive     CLINDAMYCIN <=0.25 SENSITIVE Sensitive     RIFAMPIN <=0.5 SENSITIVE Sensitive     Inducible Clindamycin NEGATIVE Sensitive     LINEZOLID 2 SENSITIVE Sensitive     * STAPHYLOCOCCUS AUREUS  Blood Culture ID Panel (Reflexed)     Status: Abnormal   Collection Time: 06/22/22  7:37 AM  Result Value Ref Range Status   Enterococcus faecalis NOT DETECTED NOT DETECTED Final   Enterococcus Faecium NOT DETECTED NOT DETECTED Final   Listeria monocytogenes NOT DETECTED NOT DETECTED Final   Staphylococcus species DETECTED (A) NOT DETECTED Final    Comment: CRITICAL RESULT CALLED TO, READ BACK BY AND VERIFIED WITH: PHARMD JAMES LEDFORD ON 06/22/22 @ 2239 BY DRT    Staphylococcus aureus (BCID) DETECTED (A) NOT DETECTED Final    Comment: CRITICAL RESULT CALLED TO, READ BACK BY AND VERIFIED WITH: PHARMD JAMES LEDFORD ON 06/22/22 @ 2239 BY DRT    Staphylococcus epidermidis NOT DETECTED NOT DETECTED Final   Staphylococcus lugdunensis NOT DETECTED NOT DETECTED Final   Streptococcus species NOT DETECTED NOT DETECTED Final   Streptococcus agalactiae NOT DETECTED NOT DETECTED Final   Streptococcus pneumoniae NOT DETECTED NOT DETECTED Final   Streptococcus pyogenes NOT DETECTED NOT DETECTED Final   A.calcoaceticus-baumannii NOT DETECTED NOT DETECTED Final   Bacteroides fragilis NOT DETECTED NOT DETECTED Final   Enterobacterales NOT DETECTED NOT DETECTED Final   Enterobacter cloacae complex NOT DETECTED NOT DETECTED Final   Escherichia coli NOT DETECTED NOT DETECTED Final   Klebsiella aerogenes NOT DETECTED NOT DETECTED Final   Klebsiella oxytoca NOT DETECTED NOT DETECTED Final   Klebsiella pneumoniae NOT DETECTED NOT DETECTED Final   Proteus species NOT DETECTED NOT DETECTED Final   Salmonella species NOT  DETECTED NOT DETECTED Final   Serratia marcescens NOT DETECTED NOT DETECTED Final   Haemophilus influenzae NOT DETECTED NOT DETECTED Final   Neisseria meningitidis NOT DETECTED NOT DETECTED Final   Pseudomonas aeruginosa NOT DETECTED NOT DETECTED Final   Stenotrophomonas maltophilia NOT DETECTED NOT DETECTED Final   Candida albicans NOT DETECTED NOT DETECTED Final   Candida auris NOT DETECTED NOT DETECTED Final   Candida glabrata NOT DETECTED NOT DETECTED Final   Candida krusei NOT DETECTED NOT DETECTED Final   Candida parapsilosis NOT DETECTED NOT DETECTED Final   Candida tropicalis NOT DETECTED NOT DETECTED Final   Cryptococcus neoformans/gattii NOT DETECTED NOT DETECTED Final   Meth resistant mecA/C and MREJ NOT DETECTED NOT DETECTED Final    Comment: Performed at [facility], [address] [place], [place] [address]  Aerobic/Anaerobic Culture w Gram Stain (surgical/deep wound)     Status: None   Collection Time: 06/22/22 10:52 AM   Specimen: Wound; Body Fluid  Result Value Ref Range Status   Specimen Description FLUID  Final   Special Requests LEFT ARM PERI GRAFT  Final   Gram Stain   Final    MODERATE WBC PRESENT, PREDOMINANTLY PMN RARE SQUAMOUS EPITHELIAL CELLS PRESENT FEW GRAM POSITIVE COCCI    Culture   Final    FEW STAPHYLOCOCCUS AUREUS NO ANAEROBES ISOLATED Performed at [facility], [address], [place], [place] [address]    Report Status 06/27/2022 FINAL  Final   Organism ID, Bacteria STAPHYLOCOCCUS AUREUS  Final      Susceptibility   Staphylococcus aureus - MIC*    CIPROFLOXACIN <=0.5 SENSITIVE Sensitive     ERYTHROMYCIN >=8 RESISTANT Resistant     GENTAMICIN <=0.5 SENSITIVE Sensitive     OXACILLIN <=0.25 SENSITIVE Sensitive     TETRACYCLINE >=16 RESISTANT Resistant     VANCOMYCIN <=0.5 SENSITIVE Sensitive     TRIMETH/SULFA <=10 SENSITIVE Sensitive     CLINDAMYCIN <=0.25 SENSITIVE Sensitive     RIFAMPIN <=0.5 SENSITIVE Sensitive     Inducible  Clindamycin NEGATIVE Sensitive  LINEZOLID 2 SENSITIVE Sensitive     * FEW STAPHYLOCOCCUS AUREUS  Culture, blood (Routine X 2) w Reflex to ID Panel     Status: None   Collection Time: 06/23/22  8:03 AM   Specimen: BLOOD  Result Value Ref Range Status   Specimen Description BLOOD RIGHT ANTECUBITAL  Final   Special Requests   Final    BOTTLES DRAWN AEROBIC AND ANAEROBIC Blood Culture results may not be optimal due to an inadequate volume of blood received in culture bottles   Culture   Final    NO GROWTH 5 DAYS Performed at [facility], [address], [place], [place] [address]    Report Status 06/28/2022 FINAL  Final  Culture, blood (Routine X 2) w Reflex to ID Panel     Status: None   Collection Time: 06/23/22  8:05 AM   Specimen: BLOOD RIGHT FOREARM  Result Value Ref Range Status   Specimen Description BLOOD RIGHT FOREARM  Final   Special Requests   Final    BOTTLES DRAWN AEROBIC AND ANAEROBIC Blood Culture adequate volume   Culture   Final    NO GROWTH 5 DAYS Performed at [facility], [address], [place], [place] [address]    Report Status 06/28/2022 FINAL  Final  Culture, blood (Routine X 2) w Reflex to ID Panel     Status: None   Collection Time: 06/25/22  4:06 AM   Specimen: BLOOD  Result Value Ref Range Status   Specimen Description BLOOD BLOOD RIGHT ARM  Final   Special Requests   Final    BOTTLES DRAWN AEROBIC AND ANAEROBIC Blood Culture adequate volume   Culture   Final    NO GROWTH 5 DAYS Performed at [facility], [address] [place], [place] [address]    Report Status 06/30/2022 FINAL  Final  Culture, blood (Routine X 2) w Reflex to ID Panel     Status: None   Collection Time: 06/25/22  4:07 AM   Specimen: BLOOD RIGHT HAND  Result Value Ref Range Status   Specimen Description BLOOD RIGHT HAND  Final   Special Requests   Final    BOTTLES DRAWN AEROBIC AND ANAEROBIC Blood Culture adequate volume   Culture   Final    NO GROWTH 5  DAYS Performed at [facility], [address], [place], [place] [address]    Report Status 06/30/2022 FINAL  Final   CT Chest 07/30/22 Lungs and pleura Small cavitary RLL lesion has nearly resolved. Cavitary LLL lesion has contracted with residual faintly calcified scar measuring 1.5 x 0.9 cm. Tiny amount of left pleural fluid. Minimal RLL micro nodularity  IMPRESSION:  1. Resolving cavitary bilateral lower lobe lesions, likely sequela of septic emboli. Minimal reticulonodular inflammatory scarring.  2. Cardiomegaly.  3. Other chronic findings as described.    Assessment/plan 32 year old female with prior history of asthma type I DM complicated with retinopathy, ESRD on HD, HTN, morbid obesity, MSSA bacteremia in 06/2021 related to HD catheter, anxiety/depressionwho is here for MSSA bacteremia secondary to infected AV graft.   # Complicated MSSA bacteremia 2/2 infected AVG 6/4 s/p complete excision of AVG and Vein patch angioplasty of the left brachial artery. OR cx with MSSA Blood cx cleared on 6/5 6/10 new HD catheter placed  6/5 TTE and 6/11 TEE negative for vegetations or endocarditis. Completed 4 weeks of IV cefazolin on 7/3 6/18 CT chest with bibasilar cavitary lesions, suspect  MSSA related.  Restarted back on cefazolin with HD from 7/9 for 2 weeks, EOT 7/24 7/12 CT chest with improved findings as above   Plan DC cefazolin Staff to inform HD Center regarding stopping antibiotics and request last labs  Fu with [name] as needed  I have personally spent 40 minutes involved in face-to-face and non-face-to-face activities for this patient on the day of the visit. Professional time spent includes the following activities: Preparing to see the patient (review of tests), Obtaining and/or reviewing separately obtained history (admission/discharge record), Performing a medically appropriate examination and/or evaluation , Ordering medications/tests/procedures, referring and communicating  with other health care professionals, Documenting clinical information in the EMR, Independently interpreting results (not separately reported), Communicating results to the patient/family/caregiver, Counseling and educating the patient/family/caregiver and Care coordination (not separately reported).   Victoriano Lain, MD [facility] [facility] 08/12/2022, 1:34 PM

## 2022-08-12 NOTE — H&P (View-Only) (Signed)
Established Dialysis Access   History of Present Illness   Barbara Donovan is a 32 y.o. (1991/01/04) female who presents for re-evaluation for permanent access.  The patient is right hand dominant.  She recently had left upper arm graft removed on 06/22/22  by Dr. Edilia Bo due to infection. Her left arm is now healed. She denies any pain, weakness, numbness in left arm or hand.   She currently dialyzes via right internal jugular TDC at the [facility] location on MWF  Current Outpatient Medications  Medication Sig Dispense Refill   acetaminophen (TYLENOL) 325 MG tablet Take 650 mg by mouth every 6 (six) hours as needed for mild pain.     amLODipine (NORVASC) 10 MG tablet Take 1 tablet (10 mg total) by mouth daily. (Patient taking differently: Take 10 mg by mouth every evening.) 90 tablet 1   calcitRIOL (ROCALTROL) 0.5 MCG capsule Take 0.5 mcg by mouth every Monday, Wednesday, and Friday with hemodialysis.     carvedilol (COREG) 25 MG tablet Take 1 tablet (25 mg total) by mouth 2 (two) times daily. 180 tablet 3   Chlorhexidine Gluconate Cloth 2 % PADS Apply 6 each topically daily at 6 (six) AM. 120 each 0   Continuous Blood Gluc Receiver DEVI 1 Units by Does not apply route 4 (four) times daily -  before meals and at bedtime. May substitute for cheapest monitor with insurance 1 Units 0   Continuous Blood Gluc Sensor (DEXCOM G7 SENSOR) MISC 1 Device by Does not apply route as directed. 9 each 3   ethyl chloride spray Apply 1 Application topically every Monday, Wednesday, and Friday with hemodialysis. Apply to arm     glucose blood (FREESTYLE TEST STRIPS) test strip Use as instructed 100 each 0   hydrALAZINE (APRESOLINE) 50 MG tablet TAKE 1 TAB TWICE A DAY, MAY TAKE 1 EXTRA TAB DAILY ONLY AS NEEDED FOR ELEVATED BLOOD PRESSURE 90 tablet 0   insulin glargine (LANTUS SOLOSTAR) 100 UNIT/ML Solostar Pen Inject 18 Units into the skin every morning. (Patient taking differently: Inject 16 Units into the  skin every morning.) 15 mL 6   insulin lispro (HUMALOG KWIKPEN) 100 UNIT/ML KwikPen Inject 6 Units into the skin 2 (two) times daily with a meal. Max daily 35 units 30 mL 3   Insulin Pen Needle 32G X 4 MM MISC 1 Device by Does not apply route in the morning, at noon, in the evening, and at bedtime. 400 each 3   metoCLOPramide (REGLAN) 10 MG tablet Take 1 tablet (10 mg total) by mouth every 6 (six) hours. (Patient taking differently: Take 10 mg by mouth every 6 (six) hours as needed for nausea or vomiting.) 30 tablet 0   mirtazapine (REMERON) 15 MG tablet Take 15 mg by mouth at bedtime.     multivitamin (RENA-VIT) TABS tablet Take 1 tablet by mouth at bedtime. 90 tablet 1   ondansetron (ZOFRAN-ODT) 8 MG disintegrating tablet Take 1 tablet (8 mg total) by mouth every 8 (eight) hours as needed for nausea or vomiting. 20 tablet 0   SENSIPAR 30 MG tablet Take 30 mg by mouth daily.     sucroferric oxyhydroxide (VELPHORO) 500 MG chewable tablet Chew 1,500 mg by mouth 3 (three) times daily with meals.     No current facility-administered medications for this visit.    On ROS today: negative unless stated in HPI   Physical Examination   Vitals:   08/12/22 0858  BP: (!) 179/100  Pulse:  77  Resp: 18  Temp: 98.6 F (37 C)  TempSrc: Temporal  SpO2: 97%  Weight: 147 lb 9.6 oz (67 kg)  Height: 5\' 6"  (1.676 m)   Body mass index is 23.82 kg/m.  General Alert and oriented, in no acute distress  Pulmonary Non labored  Cardiac Regular rate and rhythm  Vascular Vessel Right Left  Radial Palpable Palpable  Brachial Palpable Palpable    Musculo- skeletal M/S 5/5 throughout  , Extremities without ischemic changes    Neurologic A&O; CN grossly intact     Non-invasive Vascular Imaging   BUE Vein Mapping  (08/12/22):  +-----------------+-------------+----------+----------------------+  Right Cephalic   Diameter (cm)Depth (cm)       Findings          +-----------------+-------------+----------+----------------------+  Prox upper arm       0.21        0.75          occluded         +-----------------+-------------+----------+----------------------+  Mid upper arm        0.25        0.56          occluded         +-----------------+-------------+----------+----------------------+  Dist upper arm       0.27        0.39                           +-----------------+-------------+----------+----------------------+  Antecubital fossa    0.26        0.43   branching and occluded  +-----------------+-------------+----------+----------------------+  Prox forearm         0.08        0.46                           +-----------------+-------------+----------+----------------------+  Mid forearm          0.06        0.47                           +-----------------+-------------+----------+----------------------+  Dist forearm         0.06        0.27         branching         +-----------------+-------------+----------+----------------------+   +-----------------+-------------+----------+---------+  Right Basilic    Diameter (cm)Depth (cm)Findings   +-----------------+-------------+----------+---------+  Prox upper arm       0.42        1.45              +-----------------+-------------+----------+---------+  Mid upper arm        0.36        1.05              +-----------------+-------------+----------+---------+  Dist upper arm       0.27        0.97              +-----------------+-------------+----------+---------+  Antecubital fossa    0.28        0.61              +-----------------+-------------+----------+---------+  Prox forearm         0.12        0.30   branching  +-----------------+-------------+----------+---------+  Mid forearm          0.20  0.44              +-----------------+-------------+----------+---------+  Distal forearm       0.12        0.17               +-----------------+-------------+----------+---------+     Medical Decision Making   Barbara Donovan is a 32 y.o. female who presents for re- evaluation of permanent dialysis access. She recently had left upper arm graft removed on 06/22/22  by Dr. Edilia Bo due to infection. Her left arm is now healed.  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 She will likely need a right upper arm AVG but we will look at the basilic vein at the time of surgery Risk, benefits, and alternatives to access surgery were discussed.  The patient is aware the risks include but are not limited to: bleeding, infection, steal syndrome, nerve damage, thrombosis, failure to mature, and need for additional procedures. The patient agrees to proceed with the procedure Will arrange right upper arm AVF vs AVG in the near future with Dr.Dickson. She dialyzes on MWF so will try to arrange this on an non dialysis day   Graceann Congress, PA-C [facility] [place] Office: [phone]  Clinic MD: Edilia Bo

## 2022-08-23 ENCOUNTER — Encounter (HOSPITAL_COMMUNITY): Payer: Self-pay | Admitting: Vascular Surgery

## 2022-08-23 ENCOUNTER — Other Ambulatory Visit: Payer: Self-pay

## 2022-08-23 DIAGNOSIS — N186 End stage renal disease: Secondary | ICD-10-CM

## 2022-08-23 NOTE — Progress Notes (Signed)
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 Recent admission June 2024 for complicated MSSA bacteremia 2/2 infected AVG. Underwent excision of AVG on 6/4. Blood cx cleared on 6/5. New HD catheter placed 6/10. 6/5 TTE and 6/11 TEE negative for vegetations or endocarditis. Completed 4 weeks of IV cefazolin on 7/3. 6/18 CT chest with bibasilar cavitary lesions, suspect MSSA related. Restarted back on cefazolin with HD from 7/9 for 2 weeks, EOT 7/24. 7/12 CT chest with improved findings.  Will need DOS labs and eval.  EKG 06/22/22: Sinus tachycardia. Rate 116.  CT Chest 07/30/22 (care everywhere): 1. Resolving cavitary bilateral lower lobe lesions, likely sequela of septic emboli. Minimal reticulonodular inflammatory scarring.  2. Cardiomegaly.  3. Other chronic findings as described.   TTE 07/30/22 [facility], pre transplant eval): 1. Left ventricle  The left ventricular cavity size is mildly dilated. There     is mild concentric hypertrophy noted. Systolic function is normal by the     biplane method of disks. The ejection fraction is 53%  +--5% .     Inconclusive diastolic evaluation due to E and A wave fusion. The global     longitudinal strain  2D  is -13.5. Average global longitudinal strain is     abnormal.  2. Right ventricle  The right ventricular cavity size is mildly dilated.  3. Left atrium  The left atrium is mildly dilated as determined by left     atrial volume index.  4. Pericardium, extracardiac  There is no pericardial effusion.  5. No significant valve stenosis or regurgitation.  No previous study was available for comparison.   Nuclear stress 07/30/22 [facility], pre transplant eval):   ECG test result was negative for evidence of ischemia. There was no  chest pain during the exam.     Myocardial perfusion imaging test is normal.    Overall left ventricular systolic function was low normal. The  calculated ejection fraction was measured at 46%, but visually appears to  be 50-55%.    Low cardiovascular risk     Zannie Cove [facility] Short Stay Center/Anesthesiology Phone [phone] 08/23/2022 4:11 PM

## 2022-08-23 NOTE — Anesthesia Preprocedure Evaluation (Signed)
Anesthesia Evaluation  Patient identified by MRN, date of birth, ID band Patient awake    Reviewed: Allergy & Precautions, NPO status , Patient's Chart, lab work & pertinent test results  Airway Mallampati: II  TM Distance: >3 FB Neck ROM: Full    Dental no notable dental hx.    Pulmonary asthma    Pulmonary exam normal        Cardiovascular hypertension, Pt. on medications and Pt. on home beta blockers Normal cardiovascular exam     Neuro/Psych  PSYCHIATRIC DISORDERS Anxiety Depression    negative neurological ROS     GI/Hepatic negative GI ROS, Neg liver ROS,,,  Endo/Other  diabetes, Insulin Dependent    Renal/GU ESRF and DialysisRenal diseaseOn HD M,W,F     Musculoskeletal negative musculoskeletal ROS (+)    Abdominal   Peds  Hematology  (+) Blood dyscrasia, anemia   Anesthesia Other Findings ESRD  Reproductive/Obstetrics Hcg negative                             Anesthesia Physical Anesthesia Plan  ASA: 3  Anesthesia Plan: General   Post-op Pain Management:    Induction: Intravenous  PONV Risk Score and Plan: 3 and Ondansetron, Dexamethasone and Treatment may vary due to age or medical condition  Airway Management Planned: LMA  Additional Equipment:   Intra-op Plan:   Post-operative Plan: Extubation in OR  Informed Consent: I have reviewed the patients History and Physical, chart, labs and discussed the procedure including the risks, benefits and alternatives for the proposed anesthesia with the patient or authorized representative who has indicated his/her understanding and acceptance.     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 Recent admission June 2024 for complicated MSSA bacteremia 2/2 infected AVG. Underwent excision of AVG on 6/4. Blood cx cleared on 6/5. New HD catheter placed 6/10. 6/5 TTE and 6/11 TEE negative for vegetations or endocarditis. Completed 4 weeks of IV cefazolin on 7/3. 6/18 CT chest with bibasilar cavitary lesions, suspect MSSA related. Restarted back on cefazolin with HD from 7/9 for 2 weeks, EOT 7/24. 7/12 CT chest with improved findings.  Will need DOS labs and eval.  EKG 06/22/22: Sinus tachycardia. Rate 116.  CT Chest 07/30/22 (care everywhere): 1. Resolving cavitary bilateral lower lobe lesions, likely sequela of septic emboli. Minimal reticulonodular inflammatory scarring.  2. Cardiomegaly.  3. Other chronic findings as described.   TTE 07/30/22 [facility], pre transplant eval): 1. Left ventricle The left ventricular cavity size is mildly dilated. There   is mild concentric hypertrophy noted. Systolic function is normal by the   biplane method of disks. The ejection fraction is 53% +--5% .   Inconclusive diastolic evaluation due to E and A wave fusion. The global   longitudinal strain 2D is -13.5. Average global longitudinal strain is   abnormal.  2. Right ventricle The right ventricular cavity size is mildly dilated.  3. Left atrium The left atrium is mildly dilated as determined by left   atrial volume index.  4.Pericardium, extracardiac There is no pericardial effusion.  5. No significant valve stenosis or regurgitation.  No previous study was available for comparison.   Nuclear stress 07/30/22 [facility], pre transplant eval):  ECG test result was negative for evidence of ischemia. There was no  chest pain during the exam.   Myocardial perfusion imaging test is normal.   Overall left ventricular systolic function was low normal. The  calculated ejection fraction was measured at 46%, but visually appears to  be 50-55%.   Low  cardiovascular risk   )        Anesthesia Quick Evaluation

## 2022-08-23 NOTE — Progress Notes (Signed)
SDW call  Patient was given pre-op instructions over the phone. Patient verbalized understanding of instructions provided.     PCP - Dr. Faith Rogue Cardiologist - Dr. Armanda Magic Pulmonary:    PPM/ICD - denies Device Orders - n/a Rep Notified - n/a   Chest x-ray - 06/26/2022 EKG -  06/22/2022 Stress Test - ECHO - 06/29/2022 Cardiac Cath -   Sleep Study/sleep apnea/CPAP: denies  Type I diabetes Fasting Blood sugar range: 170-180 How often check sugars: Continuous, Dexcom G7 to left arm Insulin glargine (Lantus), instructed to use 14 units day of surgery which is 80% of regular dose. Insulin lispro (humalog), instructed to use zero units day of surgery.  If blood sugar is >220, use 1/2 the correction dose   Blood Thinner Instructions: denies Aspirin Instructions:denies   ERAS Protcol - No, NPO    COVID TEST- n/a    Anesthesia review: Yes. Type 1 DM, HTN, ESRD    Patient denies shortness of breath, fever, cough and chest pain over the phone call  Your procedure is scheduled on Tuesday August 24, 2022   Report to [facility] Main Entrance "A" at 0840 A.M., then check in with the Admitting office.  Call this number if you have problems the morning of surgery:  [phone]   If you have any questions prior to your surgery date call [phone]: Open Monday-Friday 8am-4pm If you experience any cold or flu symptoms such as cough, fever, chills, shortness of breath, etc. between now and your scheduled surgery, please notify us at the above number    Remember:  Do not eat or dink after midnight the night before your surgery  Take these medicines the morning of surgery with A SIP OF WATER:  Carvedilol, hydralazine, sensipar  As needed: Tylenol, reglan, zofran  As of today, STOP taking any Aspirin (unless otherwise instructed by your surgeon) Aleve, Naproxen, Ibuprofen, Motrin, Advil, Goody's, BC's, all herbal medications, fish oil, and all vitamins.

## 2022-08-24 ENCOUNTER — Other Ambulatory Visit: Payer: Self-pay

## 2022-08-24 ENCOUNTER — Encounter (HOSPITAL_COMMUNITY)
Admission: RE | Disposition: A | Discharge status: Home / Self Care | Payer: Self-pay | Source: Home / Self Care | Attending: Vascular Surgery

## 2022-08-24 ENCOUNTER — Ambulatory Visit (HOSPITAL_COMMUNITY): Payer: Self-pay | Admitting: Physician Assistant

## 2022-08-24 ENCOUNTER — Ambulatory Visit (HOSPITAL_COMMUNITY)
Admission: RE | Admit: 2022-08-24 | Discharge: 2022-08-24 | Disposition: A | Discharge status: Home / Self Care | Payer: 59 | Attending: Vascular Surgery | Admitting: Vascular Surgery

## 2022-08-24 ENCOUNTER — Encounter (HOSPITAL_COMMUNITY): Payer: Self-pay | Admitting: Vascular Surgery

## 2022-08-24 ENCOUNTER — Ambulatory Visit (HOSPITAL_BASED_OUTPATIENT_CLINIC_OR_DEPARTMENT_OTHER): Payer: 59 | Admitting: Physician Assistant

## 2022-08-24 DIAGNOSIS — E1122 Type 2 diabetes mellitus with diabetic chronic kidney disease: Secondary | ICD-10-CM | POA: Insufficient documentation

## 2022-08-24 DIAGNOSIS — N186 End stage renal disease: Secondary | ICD-10-CM | POA: Insufficient documentation

## 2022-08-24 DIAGNOSIS — E1022 Type 1 diabetes mellitus with diabetic chronic kidney disease: Secondary | ICD-10-CM

## 2022-08-24 DIAGNOSIS — Z992 Dependence on renal dialysis: Secondary | ICD-10-CM | POA: Insufficient documentation

## 2022-08-24 DIAGNOSIS — J45909 Unspecified asthma, uncomplicated: Secondary | ICD-10-CM | POA: Diagnosis not present

## 2022-08-24 DIAGNOSIS — I120 Hypertensive chronic kidney disease with stage 5 chronic kidney disease or end stage renal disease: Secondary | ICD-10-CM | POA: Diagnosis not present

## 2022-08-24 DIAGNOSIS — F32A Depression, unspecified: Secondary | ICD-10-CM | POA: Diagnosis not present

## 2022-08-24 DIAGNOSIS — N185 Chronic kidney disease, stage 5: Secondary | ICD-10-CM | POA: Diagnosis not present

## 2022-08-24 DIAGNOSIS — Z794 Long term (current) use of insulin: Secondary | ICD-10-CM | POA: Insufficient documentation

## 2022-08-24 DIAGNOSIS — Z79899 Other long term (current) drug therapy: Secondary | ICD-10-CM | POA: Insufficient documentation

## 2022-08-24 HISTORY — DX: Depression, unspecified: F32.A

## 2022-08-24 HISTORY — PX: AV FISTULA PLACEMENT: SHX1204

## 2022-08-24 HISTORY — DX: Anxiety disorder, unspecified: F41.9

## 2022-08-24 LAB — GLUCOSE, CAPILLARY
Glucose-Capillary: 155 mg/dL — ABNORMAL HIGH (ref 70–99)
Glucose-Capillary: 150 mg/dL — ABNORMAL HIGH (ref 70–99)
Glucose-Capillary: 124 mg/dL — ABNORMAL HIGH (ref 70–99)

## 2022-08-24 LAB — POCT I-STAT, CHEM 8
BUN: 37 mg/dL — ABNORMAL HIGH (ref 6–20)
Calcium, Ion: 1.13 mmol/L — ABNORMAL LOW (ref 1.15–1.40)
Chloride: 98 mmol/L (ref 98–111)
Creatinine, Ser: 8.6 mg/dL — ABNORMAL HIGH (ref 0.44–1.00)
Glucose, Bld: 166 mg/dL — ABNORMAL HIGH (ref 70–99)
HCT: 35 % — ABNORMAL LOW (ref 36.0–46.0)
Hemoglobin: 11.9 g/dL — ABNORMAL LOW (ref 12.0–15.0)
Potassium: 4.7 mmol/L (ref 3.5–5.1)
Sodium: 140 mmol/L (ref 135–145)
TCO2: 30 mmol/L (ref 22–32)

## 2022-08-24 LAB — POCT PREGNANCY, URINE: Preg Test, Ur: NEGATIVE

## 2022-08-24 SURGERY — ARTERIOVENOUS (AV) FISTULA CREATION
Anesthesia: General | Site: Arm Upper | Laterality: Right

## 2022-08-24 MED ORDER — LIDOCAINE-EPINEPHRINE (PF) 1 %-1:200000 IJ SOLN
INTRAMUSCULAR | Status: AC
  Filled 2022-08-24: qty 30

## 2022-08-24 MED ORDER — HEPARIN 6000 UNIT IRRIGATION SOLUTION
Status: DC | PRN
  Administered 2022-08-24: 1

## 2022-08-24 MED ORDER — FENTANYL CITRATE (PF) 250 MCG/5ML IJ SOLN
INTRAMUSCULAR | Status: DC | PRN
  Administered 2022-08-24: 50 ug via INTRAVENOUS
  Administered 2022-08-24: 25 ug via INTRAVENOUS
  Administered 2022-08-24: 75 ug via INTRAVENOUS

## 2022-08-24 MED ORDER — HEPARIN SODIUM (PORCINE) 1000 UNIT/ML IJ SOLN
1500.0000 [IU] | Freq: Once | INTRAMUSCULAR | Status: DC
  Filled 2022-08-24: qty 1.5

## 2022-08-24 MED ORDER — PHENYLEPHRINE HCL (PRESSORS) 10 MG/ML IV SOLN
INTRAVENOUS | Status: DC | PRN
Start: 2022-08-24 — End: 2022-08-24
  Administered 2022-08-24 (×2): 80 ug via INTRAVENOUS

## 2022-08-24 MED ORDER — LIDOCAINE-EPINEPHRINE (PF) 1 %-1:200000 IJ SOLN
INTRAMUSCULAR | Status: DC | PRN
  Administered 2022-08-24: 9 mL

## 2022-08-24 MED ORDER — SODIUM CHLORIDE 0.9 % IV SOLN: INTRAVENOUS | Status: DC

## 2022-08-24 MED ORDER — CHLORHEXIDINE GLUCONATE 4 % EX SOLN: 60.0000 mL | Freq: Once | CUTANEOUS | Status: DC

## 2022-08-24 MED ORDER — CHLORHEXIDINE GLUCONATE 0.12 % MT SOLN: 15.0000 mL | Freq: Once | OROMUCOSAL | Status: AC

## 2022-08-24 MED ORDER — CEFAZOLIN SODIUM-DEXTROSE 2-4 GM/100ML-% IV SOLN
INTRAVENOUS | Status: AC
  Filled 2022-08-24: qty 100

## 2022-08-24 MED ORDER — MIDAZOLAM HCL 2 MG/2ML IJ SOLN
INTRAMUSCULAR | Status: AC
  Filled 2022-08-24: qty 2

## 2022-08-24 MED ORDER — PROTAMINE SULFATE 10 MG/ML IV SOLN
INTRAVENOUS | Status: DC | PRN
  Administered 2022-08-24 (×4): 10 mg via INTRAVENOUS

## 2022-08-24 MED ORDER — LIDOCAINE 2% (20 MG/ML) 5 ML SYRINGE
INTRAMUSCULAR | Status: DC | PRN
  Administered 2022-08-24: 60 mg via INTRAVENOUS

## 2022-08-24 MED ORDER — HEPARIN SODIUM (PORCINE) 1000 UNIT/ML IJ SOLN
1900.0000 [IU] | Freq: Once | INTRAMUSCULAR | Status: AC
  Administered 2022-08-24: 1900 [IU] via INTRAVENOUS

## 2022-08-24 MED ORDER — CHLORHEXIDINE GLUCONATE 0.12 % MT SOLN
OROMUCOSAL | Status: AC
  Administered 2022-08-24: 15 mL via OROMUCOSAL
  Filled 2022-08-24: qty 15

## 2022-08-24 MED ORDER — 0.9 % SODIUM CHLORIDE (POUR BTL) OPTIME
TOPICAL | Status: DC | PRN
  Administered 2022-08-24: 1000 mL

## 2022-08-24 MED ORDER — PAPAVERINE HCL 30 MG/ML IJ SOLN
INTRAMUSCULAR | Status: DC | PRN
  Administered 2022-08-24: 60 mg

## 2022-08-24 MED ORDER — ORAL CARE MOUTH RINSE: 15.0000 mL | Freq: Once | OROMUCOSAL | Status: AC

## 2022-08-24 MED ORDER — CEFAZOLIN SODIUM-DEXTROSE 2-4 GM/100ML-% IV SOLN
2.0000 g | INTRAVENOUS | Status: AC
  Administered 2022-08-24: 2 g via INTRAVENOUS

## 2022-08-24 MED ORDER — DEXAMETHASONE SODIUM PHOSPHATE 10 MG/ML IJ SOLN
INTRAMUSCULAR | Status: DC | PRN
  Administered 2022-08-24: 4 mg via INTRAVENOUS

## 2022-08-24 MED ORDER — PAPAVERINE HCL 30 MG/ML IJ SOLN
INTRAMUSCULAR | Status: AC
  Filled 2022-08-24: qty 2

## 2022-08-24 MED ORDER — OXYCODONE-ACETAMINOPHEN 5-325 MG PO TABS: 1.0000 | ORAL_TABLET | ORAL | 0 refills | Status: DC | PRN

## 2022-08-24 MED ORDER — MIDAZOLAM HCL 2 MG/2ML IJ SOLN
INTRAMUSCULAR | Status: DC | PRN
  Administered 2022-08-24 (×2): 1 mg via INTRAVENOUS

## 2022-08-24 MED ORDER — ONDANSETRON HCL 4 MG/2ML IJ SOLN
INTRAMUSCULAR | Status: DC | PRN
  Administered 2022-08-24: 4 mg via INTRAVENOUS

## 2022-08-24 MED ORDER — HEPARIN 6000 UNIT IRRIGATION SOLUTION
Status: AC
  Filled 2022-08-24: qty 500

## 2022-08-24 MED ORDER — FENTANYL CITRATE (PF) 250 MCG/5ML IJ SOLN
INTRAMUSCULAR | Status: AC
  Filled 2022-08-24: qty 5

## 2022-08-24 MED ORDER — PROPOFOL 10 MG/ML IV BOLUS
INTRAVENOUS | Status: DC | PRN
  Administered 2022-08-24: 200 mg via INTRAVENOUS

## 2022-08-24 MED ORDER — HEPARIN SODIUM (PORCINE) 1000 UNIT/ML IJ SOLN
INTRAMUSCULAR | Status: DC | PRN
  Administered 2022-08-24: 7000 [IU] via INTRAVENOUS

## 2022-08-24 SURGICAL SUPPLY — 35 items
ADH SKN CLS APL DERMABOND .7 (GAUZE/BANDAGES/DRESSINGS) ×1
ARMBAND PINK RESTRICT EXTREMIT (MISCELLANEOUS) ×2 IMPLANT
BAG COUNTER SPONGE SURGICOUNT (BAG) ×1 IMPLANT
BAG SPNG CNTER NS LX DISP (BAG) ×1
CANISTER SUCT 3000ML PPV (MISCELLANEOUS) ×1 IMPLANT
CANNULA VESSEL 3MM 2 BLNT TIP (CANNULA) ×1 IMPLANT
CLIP TI MEDIUM 6 (CLIP) ×1 IMPLANT
CLIP TI WIDE RED SMALL 6 (CLIP) ×1 IMPLANT
COVER PROBE W GEL 5X96 (DRAPES) IMPLANT
DERMABOND ADVANCED .7 DNX12 (GAUZE/BANDAGES/DRESSINGS) ×1 IMPLANT
ELECT REM PT RETURN 9FT ADLT (ELECTROSURGICAL) ×1
ELECTRODE REM PT RTRN 9FT ADLT (ELECTROSURGICAL) ×1 IMPLANT
GLOVE BIO SURGEON STRL SZ7.5 (GLOVE) ×1 IMPLANT
GLOVE BIOGEL PI IND STRL 8 (GLOVE) ×1 IMPLANT
GLOVE SURG POLY ORTHO LF SZ7.5 (GLOVE) IMPLANT
GLOVE SURG UNDER LTX SZ8 (GLOVE) ×1 IMPLANT
GOWN STRL REUS W/ TWL LRG LVL3 (GOWN DISPOSABLE) ×3 IMPLANT
GOWN STRL REUS W/TWL LRG LVL3 (GOWN DISPOSABLE) ×3
KIT BASIN OR (CUSTOM PROCEDURE TRAY) ×1 IMPLANT
KIT TURNOVER KIT B (KITS) ×1 IMPLANT
NS IRRIG 1000ML POUR BTL (IV SOLUTION) ×1 IMPLANT
PACK CV ACCESS (CUSTOM PROCEDURE TRAY) ×1 IMPLANT
PAD ARMBOARD 7.5X6 YLW CONV (MISCELLANEOUS) ×2 IMPLANT
SLING ARM FOAM STRAP LRG (SOFTGOODS) IMPLANT
SLING ARM FOAM STRAP MED (SOFTGOODS) IMPLANT
SPIKE FLUID TRANSFER (MISCELLANEOUS) ×1 IMPLANT
SPONGE SURGIFOAM ABS GEL 100 (HEMOSTASIS) IMPLANT
SUT MNCRL AB 4-0 PS2 18 (SUTURE) ×1 IMPLANT
SUT PROLENE 6 0 BV (SUTURE) ×1 IMPLANT
SUT VIC AB 3-0 SH 27 (SUTURE) ×1
SUT VIC AB 3-0 SH 27X BRD (SUTURE) ×1 IMPLANT
SYR 20CC LL (SYRINGE) IMPLANT
TOWEL GREEN STERILE (TOWEL DISPOSABLE) ×1 IMPLANT
UNDERPAD 30X36 HEAVY ABSORB (UNDERPADS AND DIAPERS) ×1 IMPLANT
WATER STERILE IRR 1000ML POUR (IV SOLUTION) ×1 IMPLANT

## 2022-08-24 NOTE — Anesthesia Postprocedure Evaluation (Signed)
Anesthesia Post Note  Patient: Barbara Donovan  Procedure(s) Performed: RIGHT UPPER EXTREMITY ARTERIOVENOUS (AV) FISTULA CREATION (Right: Arm Upper)     Patient location during evaluation: PACU Anesthesia Type: General Level of consciousness: awake Pain management: pain level controlled Vital Signs Assessment: post-procedure vital signs reviewed and stable Respiratory status: spontaneous breathing, nonlabored ventilation and respiratory function stable Cardiovascular status: blood pressure returned to baseline and stable Postop Assessment: no apparent nausea or vomiting Anesthetic complications: no   No notable events documented.  Last Vitals:  Vitals:   08/24/22 1400 08/24/22 1415  BP: (!) 157/87 (!) 152/90  Pulse: 73 67  Resp: 14 14  Temp:  36.6 C  SpO2: 94% 94%    Last Pain:  Vitals:   08/24/22 1345  TempSrc:   PainSc: Asleep                  P 

## 2022-08-24 NOTE — Op Note (Signed)
    NAME: FAIR BRAHMBHATT    MRN: [id number] DOB: Dec 07, 1990    DATE OF OPERATION: 08/24/2022  PREOP DIAGNOSIS:    End-stage renal disease  POSTOP DIAGNOSIS:    Same  PROCEDURE:    For stage right basilic vein transposition  SURGEON: Di Kindle. Edilia Bo, MD  ASSIST: Graceann Congress, PA  ANESTHESIA: General  EBL: Minimal  INDICATIONS:    Barbara Donovan is a 32 y.o. female who presents for new access.  FINDINGS:   The basilic vein was 3 to 3.5 mm in diameter.  The brachial artery was 3 mm.  I elected to do this in 2 stages.  TECHNIQUE:   The patient was taken to the operating room and received a general anesthetic.  The right arm was prepped and draped in the usual sterile fashion.  I did look at the basilic vein myself with the SonoSite and it was somewhat small but looked reasonable for basilic vein transposition.  An oblique incision was made just above the antecubital level.  Through this incision the basilic vein was dissected free.  Of note there were multiple branches which were divided between 3-0 silk ties.  The vein then bifurcated just below the antecubital level.  Through the same incision the brachial artery was dissected free.  This was somewhat small.  It was about 3 mm in diameter.  Given the small vein and small artery I elected to do this as a for stage basilic vein transposition.  The patient was heparinized.  The vein was ligated distally and the 2 branches were connected to provide for a more wide Lee spatulated anastomosis.  The brachial artery was clamped proximally distally and a longitudinal arteriotomy was made.  The graft was sewn end to side of the artery using continuous 6-0 Prolene suture.  At the completion there was an excellent thrill in the fistula.  There was a radial and ulnar signal with the Doppler.  Hemostasis was obtained in the wound.  The wound was closed with a deep layer of 3-0 Vicryl and the skin closed with 4-0 Monocryl.  Dermabond  was applied.  The patient tolerated the procedure well and was transferred to the recovery room in stable condition.  All needle and sponge counts were correct.  Given the complexity of the case,  the assistant was necessary in order to expedient the procedure and safely perform the technical aspects of the operation.  The assistant provided traction and countertraction to assist with exposure of the artery and vein.  They also assisted with suture ligation of multiple venous branches.  They played a critical role in the anastomosis. These skills, especially following the Prolene suture for the anastomosis, could not have been adequately performed by a scrub tech assistant.    Waverly Ferrari, MD, FACS [facility]  DATE OF DICTATION:   08/24/2022

## 2022-08-24 NOTE — Interval H&P Note (Signed)
History and Physical Interval Note:  08/24/2022 10:53 AM  Barbara Donovan  has presented today for surgery, with the diagnosis of ESRD.  The various methods of treatment have been discussed with the patient and family. After consideration of risks, benefits and other options for treatment, the patient has consented to  Procedure(s): RIGHT UPPER EXTREMITY ARTERIOVENOUS (AV) FISTULA VERSUS ARTERIOVENOUS GRAFT CREATION (Right) as a surgical intervention.  The patient's history has been reviewed, patient examined, no change in status, stable for surgery.  I have reviewed the patient's chart and labs.  Questions were answered to the patient's satisfaction.     Waverly Ferrari

## 2022-08-24 NOTE — Transfer of Care (Signed)
Immediate Anesthesia Transfer of Care Note  Patient: Barbara Donovan  Procedure(s) Performed: RIGHT UPPER EXTREMITY ARTERIOVENOUS (AV) FISTULA CREATION (Right: Arm Upper)  Patient Location: PACU  Anesthesia Type:General  Level of Consciousness: awake, alert , and oriented  Airway & Oxygen Therapy: Patient connected to nasal cannula oxygen  Post-op Assessment: Post -op Vital signs reviewed and stable  Post vital signs: stable  Last Vitals:  Vitals Value Taken Time  BP 145/86 08/24/22 1321  Temp    Pulse 71 08/24/22 1325  Resp 1 08/24/22 1325  SpO2 93 % 08/24/22 1325  Vitals shown include unfiled device data.  Last Pain:  Vitals:   08/24/22 0919  TempSrc:   PainSc: 0-No pain         Complications: No notable events documented.

## 2022-08-24 NOTE — Interval H&P Note (Signed)
History and Physical Interval Note:  08/24/2022 10:54 AM  [name]  has presented today for surgery, with the diagnosis of ESRD.  The various methods of treatment have been discussed with the patient and family. After consideration of risks, benefits and other options for treatment, the patient has consented to  Procedure(s): RIGHT UPPER EXTREMITY ARTERIOVENOUS (AV) FISTULA VERSUS ARTERIOVENOUS GRAFT CREATION (Right) as a surgical intervention.  The patient's history has been reviewed, patient examined, no change in status, stable for surgery.  I have reviewed the patient's chart and labs.  Questions were answered to the patient's satisfaction.     Barbara Donovan

## 2022-08-24 NOTE — Anesthesia Procedure Notes (Signed)
Procedure Name: LMA Insertion Date/Time: 08/24/2022 11:44 AM  Performed by: Dorie Rank, CRNAPre-anesthesia Checklist: Patient identified, Emergency Drugs available, Suction available, Patient being monitored and Timeout performed Patient Re-evaluated:Patient Re-evaluated prior to induction Oxygen Delivery Method: Circle system utilized Preoxygenation: Pre-oxygenation with 100% oxygen Induction Type: IV induction Ventilation: Mask ventilation without difficulty LMA: LMA inserted LMA Size: 4.0 Placement Confirmation: breath sounds checked- equal and bilateral and positive ETCO2 Tube secured with: Tape Dental Injury: Teeth and Oropharynx as per pre-operative assessment

## 2022-08-25 ENCOUNTER — Encounter (HOSPITAL_COMMUNITY): Payer: Self-pay | Admitting: Vascular Surgery

## 2022-08-26 ENCOUNTER — Ambulatory Visit (INDEPENDENT_AMBULATORY_CARE_PROVIDER_SITE_OTHER): Payer: 59 | Admitting: Internal Medicine

## 2022-08-26 ENCOUNTER — Encounter: Payer: Self-pay | Admitting: Internal Medicine

## 2022-08-26 ENCOUNTER — Telehealth: Payer: Self-pay | Admitting: Vascular Surgery

## 2022-08-26 VITALS — BP 140/72 | HR 91 | Ht 66.0 in | Wt 142.0 lb

## 2022-08-26 DIAGNOSIS — N186 End stage renal disease: Secondary | ICD-10-CM | POA: Diagnosis not present

## 2022-08-26 DIAGNOSIS — E1065 Type 1 diabetes mellitus with hyperglycemia: Secondary | ICD-10-CM

## 2022-08-26 DIAGNOSIS — E10319 Type 1 diabetes mellitus with unspecified diabetic retinopathy without macular edema: Secondary | ICD-10-CM | POA: Diagnosis not present

## 2022-08-26 DIAGNOSIS — E1022 Type 1 diabetes mellitus with diabetic chronic kidney disease: Secondary | ICD-10-CM | POA: Diagnosis not present

## 2022-08-26 DIAGNOSIS — Z992 Dependence on renal dialysis: Secondary | ICD-10-CM

## 2022-08-26 LAB — POCT GLYCOSYLATED HEMOGLOBIN (HGB A1C): Hemoglobin A1C: 5.8 % — AB (ref 4.0–5.6)

## 2022-08-26 MED ORDER — LANTUS SOLOSTAR 100 UNIT/ML ~~LOC~~ SOPN
17.0000 [IU] | PEN_INJECTOR | SUBCUTANEOUS | 6 refills | Status: DC
Start: 2022-08-26 — End: 2022-11-02

## 2022-08-26 MED ORDER — INSULIN PEN NEEDLE 32G X 4 MM MISC: 1.0000 | Freq: Four times a day (QID) | 3 refills | Status: DC

## 2022-08-26 MED ORDER — INSULIN LISPRO (1 UNIT DIAL) 100 UNIT/ML (KWIKPEN): PEN_INJECTOR | SUBCUTANEOUS | 6 refills | Status: DC
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
## 2022-08-26 NOTE — Telephone Encounter (Signed)
-----   Message from Barbara Donovan sent at 08/24/2022 12:58 PM EDT ----- Regarding: charge and f/u This patient had a right first stage basilic vein transposition.  She will need to follow-up on the PA schedule in 6 weeks approximately with a duplex of her fistula at that time.  Thank you.  CD

## 2022-08-26 NOTE — Patient Instructions (Addendum)
Decrease Lantus 17  units daily  Change  Humalog 6 units with Breakfast, 6 units with lunch and 8 units with Supper  Humalog correctional insulin: ADD extra units on insulin to your meal-time Humalog  dose if your blood sugars are higher than 170. Use the scale below to help guide you:   Blood sugar before meal Number of units to inject  Less than 170 0 unit  171 -  220 1 units  221 -  270 2 units  271 -  320 3 units  321 -  370 4 units  371 -  420 5 units  421 -  470 6 units     HOW TO TREAT LOW BLOOD SUGARS (Blood sugar LESS THAN 70 MG/DL) Please follow the RULE OF 15 for the treatment of hypoglycemia treatment (when your (blood sugars are less than 70 mg/dL)   STEP 1: Take 15 grams of carbohydrates when your blood sugar is low, which includes:  3-4 GLUCOSE TABS  OR 3-4 OZ OF JUICE OR REGULAR SODA OR ONE TUBE OF GLUCOSE GEL    STEP 2: RECHECK blood sugar in 15 MINUTES STEP 3: If your blood sugar is still low at the 15 minute recheck --> then, go back to STEP 1 and treat AGAIN with another 15 grams of carbohydrates.

## 2022-08-27 ENCOUNTER — Encounter: Payer: Self-pay | Admitting: Internal Medicine

## 2022-09-08 NOTE — Telephone Encounter (Signed)
Pt appt scheduled

## 2022-09-09 ENCOUNTER — Encounter: Payer: Self-pay | Admitting: Student

## 2022-09-09 ENCOUNTER — Ambulatory Visit (INDEPENDENT_AMBULATORY_CARE_PROVIDER_SITE_OTHER): Payer: 59 | Admitting: Student

## 2022-09-09 VITALS — BP 171/89 | HR 88 | Temp 98.1°F | Ht 66.0 in | Wt 145.8 lb

## 2022-09-09 DIAGNOSIS — N186 End stage renal disease: Secondary | ICD-10-CM | POA: Diagnosis not present

## 2022-09-09 DIAGNOSIS — E1010 Type 1 diabetes mellitus with ketoacidosis without coma: Secondary | ICD-10-CM | POA: Diagnosis not present

## 2022-09-09 DIAGNOSIS — I10 Essential (primary) hypertension: Secondary | ICD-10-CM

## 2022-09-09 DIAGNOSIS — I120 Hypertensive chronic kidney disease with stage 5 chronic kidney disease or end stage renal disease: Secondary | ICD-10-CM

## 2022-09-09 DIAGNOSIS — F32A Depression, unspecified: Secondary | ICD-10-CM

## 2022-09-09 DIAGNOSIS — Z794 Long term (current) use of insulin: Secondary | ICD-10-CM

## 2022-09-09 DIAGNOSIS — E10319 Type 1 diabetes mellitus with unspecified diabetic retinopathy without macular edema: Secondary | ICD-10-CM | POA: Diagnosis not present

## 2022-09-09 DIAGNOSIS — F419 Anxiety disorder, unspecified: Secondary | ICD-10-CM

## 2022-09-09 DIAGNOSIS — Z992 Dependence on renal dialysis: Secondary | ICD-10-CM

## 2022-09-09 MED ORDER — MIRTAZAPINE 15 MG PO TABS: 15.0000 mg | ORAL_TABLET | Freq: Every day | ORAL | 2 refills | Status: DC

## 2022-09-09 NOTE — Patient Instructions (Signed)
Thank you, [name] for allowing [name] to provide your care today. Today we discussed HTN and depression.    I have ordered the following labs for you:   Lab Orders         BMP8+Anion Gap       I have ordered the following medication/changed the following medications:   Stop the following medications: Medications Discontinued During This Encounter  Medication Reason   oxyCODONE-acetaminophen (PERCOCET) 5-325 MG tablet Patient Preference     Start the following medications: Meds ordered this encounter  Medications   mirtazapine (REMERON) 15 MG tablet    Sig: Take 1 tablet (15 mg total) by mouth at bedtime. Do not take medicine in the morning and drive.    Dispense:  30 tablet    Refill:  2     Follow up:  4 weeks      Remember: The mirtazapine may take up to 4-6 weeks to work.   Should you have any questions or concerns please call the internal medicine clinic at [phone].     Faith Rogue, D.O. [facility]

## 2022-09-09 NOTE — Progress Notes (Signed)
Established Patient Office Visit  Subjective   Patient ID: Barbara Donovan, female    DOB: 1990/12/30  Age: 32 y.o. MRN: [id number]  Chief Complaint  Patient presents with   Follow-up    Barbara Donovan is a 32 year old female who presents today to follow up on chronic conditions. Please see problem based assessment and plan for additional details.     Past Medical History:  Diagnosis Date   Anxiety    Asthma    as a child, no problems as an adult, no inhaler   Cataract    NS OU   Chronic hypertension during pregnancy, antepartum 08/19/2017   Dehydration 01/28/2018   Depression    Depression during pregnancy, antepartum 07/07/2017   6/20: Short trial of zoloft previously, reports didn't help much but also didn't give it a chance Discussed r/b/a SSRIs in pregnancy, agrees to try Zoloft again, rx sent No SI/HI/red flags   Diabetes (HCC)    TYPE I. 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 A2/B GDM)  Required Referrals for A1GDM or A2GDM: [x]  Diabetes Education and Testing Supplies [x]  Nutrition Cousult  For A2/B GDM or higher classes of DM [x]  Diabetes Education and Testing Supplies [x]  Nutrition Counsult [x]  Fetal ECHO after 20 weeks  [x]  Eye exam for retina evaluation - severe retinopathy 7/19  Base   Ventricular septal defect (VSD) of fetus in  singleton pregnancy, antepartum 09/30/2017   May go to newborn nursery per Dr. [place] [facility] prior to discharge       Objective:     BP (!) 171/89 (BP Location: Right Arm, Patient Position: Sitting, Cuff Size: Normal)   Pulse 88   Temp 98.1 F (36.7 C) (Oral)   Ht 5\' 6"  (1.676 m)   Wt 145 lb 12.8 oz (66.1 kg)   LMP  (LMP Unknown)   SpO2 98%   BMI 23.53 kg/m  BP Readings from Last 3 Encounters:  09/09/22 (!) 171/89  08/26/22 (!) 140/72  08/24/22 (!) 152/90      Physical Exam Vitals reviewed.  Constitutional:      Appearance: She is normal weight.  Cardiovascular:     Rate and Rhythm: Normal rate and regular rhythm.     Heart sounds: Normal heart sounds. No murmur heard.    Comments: TDC on Left chest wall Pulmonary:     Effort: Pulmonary effort is normal.     Breath sounds: Normal breath sounds.  Musculoskeletal:     Right lower leg: No edema.     Left lower leg: No edema.  Skin:    General: Skin is warm and dry.  Neurological:     Mental Status: She is alert and oriented to person, place, and time.  Psychiatric:        Mood and Affect: Mood normal. Affect is tearful.  Behavior: Behavior is cooperative.        Thought Content: Thought content does not include homicidal or suicidal plan.      Results for orders placed or performed in visit on 09/09/22  BMP8+Anion Gap  Result Value Ref Range   Glucose 165 (H) 70 - 99 mg/dL   BUN 49 (H) 6 - 20 mg/dL   Creatinine, Ser 8.29 (H) 0.57 - 1.00 mg/dL   eGFR 6 (L) >56 OZ/HYQ/6.57   BUN/Creatinine Ratio 6 (L) 9 - 23   Sodium 134 134 - 144 mmol/L   Potassium 4.3 3.5 - 5.2 mmol/L   Chloride 90 (L) 96 - 106 mmol/L   CO2 25 20 - 29 mmol/L   Anion Gap 19.0 (H) 10.0 - 18.0 mmol/L   Calcium 10.4 (H) 8.7 - 10.2 mg/dL    Last CBC Lab Results  Component Value Date   WBC 8.7 06/29/2022   HGB 11.9 (L) 08/24/2022   HCT 35.0 (L) 08/24/2022   MCV 90.1 06/29/2022   MCH 28.7 06/29/2022   RDW 17.3 (H) 06/29/2022   PLT  579 (H) 06/29/2022   Last metabolic panel Lab Results  Component Value Date   GLUCOSE 165 (H) 09/09/2022   NA 134 09/09/2022   K 4.3 09/09/2022   CL 90 (L) 09/09/2022   CO2 25 09/09/2022   BUN 49 (H) 09/09/2022   CREATININE 8.04 (H) 09/09/2022   GFRNONAA 5 (L) 06/29/2022   CALCIUM 10.4 (H) 09/09/2022   PHOS 4.2 06/29/2022   PROT 7.2 06/22/2022   ALBUMIN 2.6 (L) 06/29/2022   LABGLOB 3.6 01/26/2020   AGRATIO 0.6 (L) 01/26/2020   BILITOT 0.8 06/22/2022   ALKPHOS 99 06/22/2022   AST 20 06/22/2022   ALT 33 06/22/2022   ANIONGAP 17 (H) 06/29/2022   Last lipids Lab Results  Component Value Date   TRIG 149 04/11/2020   Last hemoglobin A1c Lab Results  Component Value Date   HGBA1C 5.8 (A) 08/26/2022      The ASCVD Risk score (Arnett DK, et al., 2019) failed to calculate for the following reasons:   The 2019 ASCVD risk score is only valid for ages 79 to 73    Assessment & Plan:   Problem List Items Addressed This Visit       Cardiovascular and Mediastinum   Essential hypertension - Primary    Patient presents with a history of hypertension that is currently uncontrolled today.  She is on a regimen of Coreg, amlodipine, hydralazine 100 mg 3 times a day, losartan 75.  Patient stated that she did not take medications today because she does dialysis at home and her nephrologist told her not to take blood pressure medications on the day that she does dialysis.  Patient does not have a working blood pressure cuff at home but stated that she is going to attempt to get one.  Poorly controlled blood pressure today is likely due to need for hemodialysis (today) and not currently medicated. Plan -Continue current regimen of Coreg, amlodipine, hydralazine, losartan -Continue taking blood pressure medications on the days without dialysis -BMP ordered      Relevant Orders   BMP8+Anion Gap (Completed)     Endocrine   DKA, type 1 (HCC)    Patient has a history of type 1 diabetes  mellitus.  She is currently followed with an endocrinologist who she saw recently.  Patient is on a regimen of Lantus 70 units daily, Humalog 6 units 3 times daily with meals  and a correction factor of Humalog.  Prior A1c was calculated around 7.7% due to ESRD. Plan -Continue to follow with endocrinology recommendations.      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  She states that she saw nephrology 2 weeks prior. Plan -Continue follow-up with nephrology and hemodialysis schedule      Relevant Orders   BMP8+Anion Gap (Completed)     Other   Anxiety and depression    Patient presents with a history of depression and anxiety.  She currently goes to therapy and was there a week ago, at the therapy office there is a midlevel provider who has previously prescribed her fluoxetine and Zoloft and mirtazapine.  The fluoxetine and Zoloft did not help.  Patient believes that the mirtazapine did help but she ran out of refills.  Patient is not currently suicidal or homicidal.  Today PHQ-9 is 9.  Plan: -Begin mirtazapine 15 mg, patient struggles with symptoms of insomnia and decreased appetite -Continue therapy -Patient declined psychiatry referral -Return to office in 4 weeks for mood evaluation      Relevant Medications   mirtazapine (REMERON) 15 MG tablet    Return in about 4 weeks (around 10/07/2022) for Check on mood .    Faith Rogue, DO

## 2022-09-10 ENCOUNTER — Encounter: Payer: Self-pay | Admitting: Student

## 2022-09-10 LAB — BMP8+ANION GAP
Anion Gap: 19 mmol/L — ABNORMAL HIGH (ref 10.0–18.0)
BUN/Creatinine Ratio: 6 — ABNORMAL LOW (ref 9–23)
BUN: 49 mg/dL — ABNORMAL HIGH (ref 6–20)
CO2: 25 mmol/L (ref 20–29)
Calcium: 10.4 mg/dL — ABNORMAL HIGH (ref 8.7–10.2)
Chloride: 90 mmol/L — ABNORMAL LOW (ref 96–106)
Creatinine, Ser: 8.04 mg/dL — ABNORMAL HIGH (ref 0.57–1.00)
Glucose: 165 mg/dL — ABNORMAL HIGH (ref 70–99)
Potassium: 4.3 mmol/L (ref 3.5–5.2)
Sodium: 134 mmol/L (ref 134–144)
eGFR: 6 mL/min/{1.73_m2} — ABNORMAL LOW (ref >59)

## 2022-09-10 NOTE — Assessment & Plan Note (Signed)
Patient has a history of type 1 diabetes mellitus.  She is currently followed with an endocrinologist who she saw recently.  Patient is on a regimen of Lantus 70 units daily, Humalog 6 units 3 times daily with meals and a correction factor of Humalog.  Prior A1c was calculated around 7.7% due to ESRD. Plan -Continue to follow with endocrinology recommendations.

## 2022-09-10 NOTE — Assessment & Plan Note (Addendum)
Patient presents with a history of depression and anxiety.  She currently goes to therapy and was there a week ago, at the therapy office there is a midlevel provider who has previously prescribed her fluoxetine and Zoloft and mirtazapine.  The fluoxetine and Zoloft did not help.  Patient believes that the mirtazapine did help but she ran out of refills.  Patient is not currently suicidal or homicidal.  Today PHQ-9 is 9.  Plan: -Begin mirtazapine 15 mg, patient struggles with symptoms of insomnia and decreased appetite -Continue therapy -Patient declined psychiatry referral -Return to office in 4 weeks for mood evaluation

## 2022-09-10 NOTE — Assessment & Plan Note (Signed)
Patient has a history of end-stage renal disease and is currently doing hemodialysis at home with a TDC.  She states that she saw nephrology 2 weeks prior. Plan -Continue follow-up with nephrology and hemodialysis schedule

## 2022-09-10 NOTE — Assessment & Plan Note (Signed)
Patient presents with a history of hypertension that is currently uncontrolled today.  She is on a regimen of Coreg, amlodipine, hydralazine 100 mg 3 times a day, losartan 75.  Patient stated that she did not take medications today because she does dialysis at home and her nephrologist told her not to take blood pressure medications on the day that she does dialysis.  Patient does not have a working blood pressure cuff at home but stated that she is going to attempt to get one.  Poorly controlled blood pressure today is likely due to need for hemodialysis (today) and not currently medicated. Plan -Continue current regimen of Coreg, amlodipine, hydralazine, losartan -Continue taking blood pressure medications on the days without dialysis -BMP ordered

## 2022-09-13 NOTE — Progress Notes (Signed)
 [facility] Medicine Clinic Attending  I was physically present during the key portions of the resident provided service and participated in the medical decision making of patient's management care. I reviewed pertinent patient test results.  The assessment, diagnosis, and plan were formulated together and I agree with the documentation in the resident's note.  Mercie Eon, MD

## 2022-10-04 ENCOUNTER — Other Ambulatory Visit: Payer: Self-pay | Admitting: Student

## 2022-10-08 ENCOUNTER — Encounter: Payer: 59 | Admitting: Student

## 2022-10-12 ENCOUNTER — Other Ambulatory Visit (HOSPITAL_BASED_OUTPATIENT_CLINIC_OR_DEPARTMENT_OTHER): Payer: Self-pay

## 2022-10-14 ENCOUNTER — Other Ambulatory Visit: Payer: Self-pay | Admitting: *Deleted

## 2022-10-14 DIAGNOSIS — N186 End stage renal disease: Secondary | ICD-10-CM

## 2022-10-21 ENCOUNTER — Ambulatory Visit (HOSPITAL_COMMUNITY)
Admission: RE | Admit: 2022-10-21 | Discharge: 2022-10-21 | Disposition: A | Discharge status: Home / Self Care | Payer: 59 | Source: Ambulatory Visit | Attending: Vascular Surgery | Admitting: Vascular Surgery

## 2022-10-21 ENCOUNTER — Ambulatory Visit (INDEPENDENT_AMBULATORY_CARE_PROVIDER_SITE_OTHER): Payer: Medicare Other | Admitting: Physician Assistant

## 2022-10-21 VITALS — BP 160/88 | HR 84 | Temp 98.4°F | Resp 16 | Ht 66.0 in | Wt 142.0 lb

## 2022-10-21 DIAGNOSIS — N186 End stage renal disease: Secondary | ICD-10-CM | POA: Diagnosis not present

## 2022-10-21 DIAGNOSIS — Z992 Dependence on renal dialysis: Secondary | ICD-10-CM

## 2022-10-21 NOTE — Progress Notes (Signed)
Office Note     CC:  follow up Requesting Provider:  Faith Rogue, DO  HPI: Barbara Donovan is a 32 y.o. (1990-07-04) female who presents status post first stage right basilic vein fistula creation by Dr. Edilia Bo on 08/24/2022.  She previously underwent left upper arm AV graft which subsequently was removed due to graft infection.  She believes her right arm incision from most recent surgery has healed.  She denies signs or symptoms of steal syndrome in her right hand.  She continues to dialyze at home via right IJ TDC.  She does not take any blood thinners.   Past Medical History:  Diagnosis Date   Anxiety    Asthma    as a child, no problems as an adult, no inhaler   Cataract    NS OU   Chronic hypertension during pregnancy, antepartum 08/19/2017   Dehydration 01/28/2018   Depression    Depression during pregnancy, antepartum 07/07/2017   6/20: Short trial of zoloft previously, reports didn't help much but also didn't give it a chance Discussed r/b/a SSRIs in pregnancy, agrees to try Zoloft again, rx sent No SI/HI/red flags   Diabetes (HCC)    TYPE I. 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 A2/B GDM)  Required Referrals for A1GDM or A2GDM: [x]  Diabetes Education and Testing Supplies [x]  Nutrition Cousult  For A2/B GDM or higher classes of DM [x]   Diabetes Education and Testing Supplies [x]  Nutrition Counsult [x]  Fetal ECHO after 20 weeks  [x]  Eye exam for retina evaluation - severe retinopathy 7/19  Base   Ventricular septal defect (VSD) of fetus in singleton pregnancy, antepartum 09/30/2017   May go to newborn nursery per Dr. [place] Bing Echo prior to discharge    Past Surgical History:  Procedure Laterality Date   25 GAUGE PARS PLANA VITRECTOMY WITH 20 GAUGE MVR PORT FOR MACULAR HOLE Left 07/20/2018   Procedure: 25 GAUGE PARS PLANA VITRECTOMY LEFT EYE;  Surgeon: Rennis Chris, MD;  Location: [facility] OR;  Service: Ophthalmology;  Laterality: Left;   AV FISTULA PLACEMENT Left 08/11/2021   Procedure: INSERTION OF LEFT ARM ARTERIOVENOUS (AV) GORE-TEX GRAFT;  Surgeon: Victorino Sparrow, MD;  Location: [facility] OR;  Service: Vascular;  Laterality: Left;  PERIPHERAL NERVE BLOCK   AV FISTULA PLACEMENT Right 08/24/2022   Procedure: RIGHT UPPER EXTREMITY ARTERIOVENOUS (AV) FISTULA CREATION;  Surgeon: Chuck Hint, MD;  Location: [facility] OR;  Service: Vascular;  Laterality: Right;   AVGG REMOVAL Left 06/22/2022   Procedure: REMOVAL OF ARTERIOVENOUS GORETEX GRAFT (AVGG) LEFT ARM WITH VEIN PATCH OF BRACHIAL ARTERY;  Surgeon: Chuck Hint, MD;  Location: [facility] OR;  Service: Vascular;  Laterality: Left;   CAPD INSERTION N/A 09/10/2020   Procedure: LAPAROSCOPIC INSERTION CONTINUOUS AMBULATORY PERITONEAL DIALYSIS  (CAPD) CATHETER;  Surgeon: Nada Libman, MD;  Location: [facility] OR;  Service: Vascular;  Laterality: N/A;   CESAREAN SECTION N/A 12/26/2017   Procedure: CESAREAN SECTION;  Surgeon: Tereso Newcomer, MD;  Location: [facility];  Service: Obstetrics;  Laterality: N/A;   EYE EXAMINATION UNDER ANESTHESIA Right 07/20/2018   Procedure: Eye Exam Under Anesthesia RIGHT EYE;  Surgeon: Rennis Chris, MD;  Location: [facility] OR;  Service: Ophthalmology;  Laterality: Right;   EYE SURGERY Left 07/2018   GAS INSERTION Left 07/19/2019   Procedure: INSERTION OF GAS;   Surgeon: Rennis Chris, MD;  Location: [facility] OR;  Service: Ophthalmology;  Laterality: Left;  SF6   INJECTION OF SILICONE OIL Left 07/20/2018   Procedure: Injection Of Silicone Oil LEFT EYE;  Surgeon: Rennis Chris, MD;  Location: [facility] OR;  Service: Ophthalmology;  Laterality: Left;   INSERTION OF DIALYSIS CATHETER Right 07/05/2021   Procedure: REMOVAL OF DIALYSIS CATHETER;  Surgeon: Leonie Douglas, MD;  Location: [facility] OR;  Service: Vascular;  Laterality: Right;   INSERTION OF DIALYSIS CATHETER Right 07/09/2021   Procedure: INSERTION OF TUNNELED DIALYSIS CATHETER;  Surgeon: Leonie Douglas, MD;  Location: [facility] OR;  Service: Vascular;  Laterality: Right;   INSERTION OF DIALYSIS CATHETER Left 07/09/2021   Procedure: INSERTION OF TUNNELED  DIALYSIS CATHETER;  Surgeon: Leonie Douglas, MD;  Location: [facility] OR;  Service: Vascular;  Laterality: Left;   INSERTION OF DIALYSIS CATHETER N/A 06/22/2022   Procedure: INSERTION OF LEFT INTERNAL JUGULAR TRIALYSIS DIALYSIS CATHETER;  Surgeon: Chuck Hint, MD;  Location: [facility] OR;  Service: Vascular;  Laterality: N/A;   INSERTION OF DIALYSIS CATHETER Left 06/28/2022   Procedure: ULTRASOUND GUIDED INSERTION OF TUNNELED DIALYSIS CATHETER;  Surgeon: Leonie Douglas, MD;  Location: [facility] OR;  Service: Vascular;  Laterality: Left;   IR FLUORO GUIDE CV LINE RIGHT  06/02/2020   IR US GUIDE VASC ACCESS RIGHT  06/02/2020   LASER PHOTO ABLATION Right 07/20/2018   Procedure: Laser Photo Ablation RIGHT EYE;  Surgeon: Rennis Chris, MD;  Location: [facility] OR;  Service: Ophthalmology;  Laterality: Right;   MEMBRANE PEEL Left 07/20/2018   Procedure: Membrane Peel LEFT EYE;  Surgeon: Rennis Chris, MD;  Location: [facility] OR;  Service: Ophthalmology;  Laterality: Left;   MEMBRANE PEEL Left 07/19/2019   Procedure: MEMBRANE PEEL;  Surgeon: Rennis Chris, MD;  Location: [facility] OR;  Service: Ophthalmology;  Laterality: Left;   MITOMYCIN C APPLICATION Bilateral 07/20/2018   Procedure: Avastin Application;  Surgeon:  Rennis Chris, MD;  Location: [facility] OR;  Service: Ophthalmology;  Laterality: Bilateral;   PHOTOCOAGULATION WITH LASER Left 07/20/2018   Procedure: Photocoagulation With Laser LEFT EYE;  Surgeon: Rennis Chris, MD;  Location: [facility] OR;  Service: Ophthalmology;  Laterality: Left;   PHOTOCOAGULATION WITH LASER Left 07/19/2019   Procedure: PHOTOCOAGULATION WITH LASER;  Surgeon: Rennis Chris, MD;  Location: [facility] OR;  Service: Ophthalmology;  Laterality: Left;   RETINAL DETACHMENT SURGERY Left 07/20/2018   Dr. Rennis Chris   SILICON OIL REMOVAL Left 07/19/2019   Procedure: 25g PARS PLANA VITRECTOMY WITH SILICON OIL REMOVAL;  Surgeon: Rennis Chris, MD;  Location: [facility] OR;  Service: Ophthalmology;  Laterality: Left;   TEE WITHOUT CARDIOVERSION N/A 07/08/2021   Procedure: TRANSESOPHAGEAL ECHOCARDIOGRAM (TEE);  Surgeon: Little Ishikawa, MD;  Location: [facility] ENDOSCOPY;  Service: Cardiovascular;  Laterality: N/A;   TEE WITHOUT CARDIOVERSION N/A 06/29/2022   Procedure: TRANSESOPHAGEAL ECHOCARDIOGRAM;  Surgeon: Wendall Stade, MD;  Location: [facility] INVASIVE CV LAB;  Service: Cardiovascular;  Laterality: N/A;   WISDOM TOOTH EXTRACTION      Social  History   Socioeconomic History   Marital status: Significant Other    Spouse name: Not on file   Number of children: Not on file   Years of education: Not on file   Highest education level: Not on file  Occupational History   Not on file  Tobacco Use   Smoking status: Never    Passive exposure: Never   Smokeless tobacco: Never  Vaping Use   Vaping status: Never Used  Substance and Sexual Activity   Alcohol use: Not Currently   Drug use: Never   Sexual activity: Not Currently    Birth control/protection: None  Other Topics Concern   Not on file  Social History Narrative   Not on file   Social Determinants of Health   Financial Resource Strain: Low Risk  (07/06/2022)   Overall Financial Resource Strain (CARDIA)    Difficulty of Paying Living Expenses: Not  very hard  Food Insecurity: No Food Insecurity (07/06/2022)   Hunger Vital Sign    Worried About Running Out of Food in the Last Year: Never true    Ran Out of Food in the Last Year: Never true  Transportation Needs: No Transportation Needs (07/06/2022)   PRAPARE - Administrator, Civil Service (Medical): No    Lack of Transportation (Non-Medical): No  Physical Activity: Inactive (07/06/2022)   Exercise Vital Sign    Days of Exercise per Week: 0 days    Minutes of Exercise per Session: 0 min  Stress: No Stress Concern Present (07/06/2022)   [employer] of Occupational Health - Occupational Stress Questionnaire    Feeling of Stress : Only a little  Social Connections: Socially Isolated (07/06/2022)   Social Connection and Isolation Panel [NHANES]    Frequency of Communication with Friends and Family: Once a week    Frequency of Social Gatherings with Friends and Family: Never    Attends Religious Services: More than 4 times per year    Active Member of [employer] or Organizations: No    Attends Banker Meetings: Never    Marital Status: Never married  Intimate Partner Violence: Not At Risk (07/06/2022)   Humiliation, Afraid, Rape, and Kick questionnaire    Fear of Current or Ex-Partner: No    Emotionally Abused: No    Physically Abused: No    Sexually Abused: No    Family History  Problem Relation Age of Onset   Diabetes Mother    Aneurysm Mother 14   Seizures Mother    Diabetes Father    Cataracts Father    COPD Father    Heart attack Father    Heart disease Father    Healthy Sister    Healthy Daughter    Stroke Maternal Grandfather    Amblyopia Neg Hx    Blindness Neg Hx    Glaucoma Neg Hx    Macular degeneration Neg Hx    Retinal detachment Neg Hx    Strabismus Neg Hx    Retinitis pigmentosa Neg Hx    Colon cancer Neg Hx    Stomach cancer Neg Hx    Esophageal cancer Neg Hx    Pancreatic cancer Neg Hx    Liver disease Neg Hx     Current  Outpatient Medications  Medication Sig Dispense Refill   acetaminophen (TYLENOL) 325 MG tablet Take 650 mg by mouth every 6 (six) hours as needed for mild pain.     amLODipine (NORVASC) 10 MG tablet Take 1 tablet (10  mg total) by mouth daily. (Patient taking differently: Take 10 mg by mouth every evening.) 90 tablet 1   calcitRIOL (ROCALTROL) 0.5 MCG capsule Take 0.5 mcg by mouth every Monday, Wednesday, and Friday with hemodialysis.     carvedilol (COREG) 25 MG tablet Take 1 tablet (25 mg total) by mouth 2 (two) times daily. 180 tablet 3   Chlorhexidine Gluconate Cloth 2 % PADS Apply 6 each topically daily at 6 (six) AM. 120 each 0   Continuous Blood Gluc Receiver DEVI 1 Units by Does not apply route 4 (four) times daily -  before meals and at bedtime. May substitute for cheapest monitor with insurance 1 Units 0   Continuous Blood Gluc Sensor (DEXCOM G7 SENSOR) MISC 1 Device by Does not apply route as directed. 9 each 3   ethyl chloride spray Apply 1 Application topically every Monday, Wednesday, and Friday with hemodialysis. Apply to arm     glucose blood (FREESTYLE TEST STRIPS) test strip Use as instructed 100 each 0   hydrALAZINE (APRESOLINE) 50 MG tablet TAKE 1 TAB TWICE A DAY, MAY TAKE 1 EXTRA TAB DAILY ONLY AS NEEDED FOR ELEVATED BLOOD PRESSURE (Patient taking differently: Take 100 mg by mouth 3 (three) times daily.) 90 tablet 0   insulin glargine (LANTUS SOLOSTAR) 100 UNIT/ML Solostar Pen Inject 17 Units into the skin every morning. 15 mL 6   insulin lispro (HUMALOG KWIKPEN) 100 UNIT/ML KwikPen Max daily 40 units 30 mL 6   Insulin Pen Needle 32G X 4 MM MISC 1 Device by Does not apply route in the morning, at noon, in the evening, and at bedtime. 400 each 3   losartan (COZAAR) 25 MG tablet Take 25 mg by mouth See admin instructions. Take with 50 for a total of 75 mg at bedtime     losartan (COZAAR) 50 MG tablet Take 50 mg by mouth See admin instructions. Take with 25 mg for  a total of 75 mg  at bedtime     metoCLOPramide (REGLAN) 10 MG tablet Take 1 tablet (10 mg total) by mouth every 6 (six) hours. (Patient taking differently: Take 10 mg by mouth every 6 (six) hours as needed for nausea or vomiting.) 30 tablet 0   mirtazapine (REMERON) 15 MG tablet Take 1 tablet (15 mg total) by mouth at bedtime. Do not take medicine in the morning and drive. 90 tablet 3   multivitamin (RENA-VIT) TABS tablet Take 1 tablet by mouth at bedtime. 90 tablet 1   ondansetron (ZOFRAN-ODT) 8 MG disintegrating tablet Take 1 tablet (8 mg total) by mouth every 8 (eight) hours as needed for nausea or vomiting. 20 tablet 0   SENSIPAR 30 MG tablet Take 30 mg by mouth daily.     sucroferric oxyhydroxide (VELPHORO) 500 MG chewable tablet Chew 1,500 mg by mouth 3 (three) times daily with meals.     No current facility-administered medications for this visit.    No Known Allergies   REVIEW OF SYSTEMS:   [X]  denotes positive finding, [ ]  denotes negative finding Cardiac  Comments:  Chest pain or chest pressure:    Shortness of breath upon exertion:    Short of breath when lying flat:    Irregular heart rhythm:        Vascular    Pain in calf, thigh, or hip brought on by ambulation:    Pain in feet at night that wakes you up from your sleep:     Blood clot in your veins:  Leg swelling:         Pulmonary    Oxygen at home:    Productive cough:     Wheezing:         Neurologic    Sudden weakness in arms or legs:     Sudden numbness in arms or legs:     Sudden onset of difficulty speaking or slurred speech:    Temporary loss of vision in one eye:     Problems with dizziness:         Gastrointestinal    Blood in stool:     Vomited blood:         Genitourinary    Burning when urinating:     Blood in urine:        Psychiatric    Major depression:         Hematologic    Bleeding problems:    Problems with blood clotting too easily:        Skin    Rashes or ulcers:        Constitutional     Fever or chills:      PHYSICAL EXAMINATION:  Vitals:   10/21/22 1209  BP: (!) 160/88  Pulse: 84  Resp: 16  Temp: 98.4 F (36.9 C)  TempSrc: Temporal  SpO2: 99%  Weight: 142 lb (64.4 kg)  Height: 5\' 6"  (1.676 m)    General:  WDWN in NAD; vital signs documented above Gait: Not observed HENT: WNL, normocephalic Pulmonary: normal non-labored breathing , without Rales, rhonchi,  wheezing Cardiac: regular HR Abdomen: soft, NT, no masses Skin: without rashes Vascular Exam/Pulses: palpable R radial pulse; palpable thrill at the anastomosis of the right basilic fistula Extremities: without ischemic changes, without Gangrene , without cellulitis; without open wounds;  Musculoskeletal: no muscle wasting or atrophy  Neurologic: A&O X 3 Psychiatric:  The pt has Normal affect.   Non-Invasive Vascular Imaging:   Patent right arm basilic vein fistula without hemodynamically significant stenosis Greater than 6 mm in diameter    ASSESSMENT/PLAN:: 32 y.o. female status post right first stage basilic vein fistula creation  -Patent basilic vein fistula in the right arm without signs or symptoms of steal syndrome -Based on physical exam as well as duplex, basilic vein is ready for second stage transposition -She will continue to dialyze via right IJ [facility] for now -Surgery was discussed with the patient including risks and she is agreeable to proceed.  We will schedule her right arm second stage basilic vein transposition in the next 1 to 2 weeks.   Emilie Rutter, PA-C [facility] [phone]  Clinic MD:   Karin Lieu

## 2022-11-02 ENCOUNTER — Other Ambulatory Visit: Payer: Self-pay | Admitting: Internal Medicine

## 2022-11-02 DIAGNOSIS — E10319 Type 1 diabetes mellitus with unspecified diabetic retinopathy without macular edema: Secondary | ICD-10-CM

## 2022-11-23 ENCOUNTER — Other Ambulatory Visit: Payer: Self-pay

## 2022-11-23 DIAGNOSIS — Z992 Dependence on renal dialysis: Secondary | ICD-10-CM

## 2022-11-27 ENCOUNTER — Inpatient Hospital Stay (HOSPITAL_COMMUNITY)
Admission: EM | Admit: 2022-11-27 | Discharge: 2022-11-29 | DRG: 674 | Disposition: A | Discharge status: Home / Self Care | Payer: Medicare Other | Attending: Internal Medicine | Admitting: Internal Medicine

## 2022-11-27 ENCOUNTER — Other Ambulatory Visit: Payer: Self-pay

## 2022-11-27 ENCOUNTER — Encounter (HOSPITAL_COMMUNITY): Payer: Self-pay

## 2022-11-27 DIAGNOSIS — Z8249 Family history of ischemic heart disease and other diseases of the circulatory system: Secondary | ICD-10-CM

## 2022-11-27 DIAGNOSIS — Z79899 Other long term (current) drug therapy: Secondary | ICD-10-CM

## 2022-11-27 DIAGNOSIS — J45909 Unspecified asthma, uncomplicated: Secondary | ICD-10-CM | POA: Diagnosis present

## 2022-11-27 DIAGNOSIS — Z794 Long term (current) use of insulin: Secondary | ICD-10-CM

## 2022-11-27 DIAGNOSIS — Y712 Prosthetic and other implants, materials and accessory cardiovascular devices associated with adverse incidents: Secondary | ICD-10-CM | POA: Diagnosis present

## 2022-11-27 DIAGNOSIS — E103413 Type 1 diabetes mellitus with severe nonproliferative diabetic retinopathy with macular edema, bilateral: Secondary | ICD-10-CM | POA: Diagnosis present

## 2022-11-27 DIAGNOSIS — I120 Hypertensive chronic kidney disease with stage 5 chronic kidney disease or end stage renal disease: Secondary | ICD-10-CM | POA: Diagnosis present

## 2022-11-27 DIAGNOSIS — Z823 Family history of stroke: Secondary | ICD-10-CM

## 2022-11-27 DIAGNOSIS — I82C11 Acute embolism and thrombosis of right internal jugular vein: Secondary | ICD-10-CM | POA: Diagnosis present

## 2022-11-27 DIAGNOSIS — Z992 Dependence on renal dialysis: Secondary | ICD-10-CM

## 2022-11-27 DIAGNOSIS — Z833 Family history of diabetes mellitus: Secondary | ICD-10-CM

## 2022-11-27 DIAGNOSIS — N186 End stage renal disease: Secondary | ICD-10-CM | POA: Diagnosis present

## 2022-11-27 DIAGNOSIS — F419 Anxiety disorder, unspecified: Secondary | ICD-10-CM | POA: Diagnosis present

## 2022-11-27 DIAGNOSIS — Z825 Family history of asthma and other chronic lower respiratory diseases: Secondary | ICD-10-CM

## 2022-11-27 DIAGNOSIS — D631 Anemia in chronic kidney disease: Secondary | ICD-10-CM | POA: Diagnosis present

## 2022-11-27 DIAGNOSIS — F32A Depression, unspecified: Secondary | ICD-10-CM | POA: Diagnosis present

## 2022-11-27 DIAGNOSIS — E1022 Type 1 diabetes mellitus with diabetic chronic kidney disease: Secondary | ICD-10-CM | POA: Diagnosis present

## 2022-11-27 DIAGNOSIS — T8242XA Displacement of vascular dialysis catheter, initial encounter: Principal | ICD-10-CM | POA: Diagnosis present

## 2022-11-27 DIAGNOSIS — I10 Essential (primary) hypertension: Secondary | ICD-10-CM | POA: Diagnosis present

## 2022-11-27 DIAGNOSIS — Z789 Other specified health status: Principal | ICD-10-CM

## 2022-11-27 NOTE — ED Provider Notes (Incomplete)
She has not [place] EMERGENCY DEPARTMENT AT [facility] Provider Note   CSN: [id number] Arrival date & time: 11/27/22  2202     History {Add pertinent medical, surgical, social history, OB history to HPI:1} Chief Complaint  Patient presents with  . Vascular Access Problem    Catheter came out    Barbara Donovan is a 32 y.o. female.  Ms. Plate        Home Medications Prior to Admission medications   Medication Sig Start Date End Date Taking? Authorizing Provider  acetaminophen (TYLENOL) 325 MG tablet Take 650 mg by mouth every 6 (six) hours as needed for mild pain.    [provider]  amLODipine (NORVASC) 10 MG tablet Take 1 tablet (10 mg total) by mouth daily. Patient taking differently: Take 10 mg by mouth every evening. 03/09/20   Almon Hercules, MD  calcitRIOL (ROCALTROL) 0.5 MCG capsule Take 0.5 mcg by mouth every Monday, Wednesday, and Friday with hemodialysis. 12/02/20   [provider]  carvedilol (COREG) 25 MG tablet Take 1 tablet (25 mg total) by mouth 2 (two) times daily. 03/07/19   Dyann Kief, PA-C  Chlorhexidine Gluconate Cloth 2 % PADS Apply 6 each topically daily at 6 (six) AM. 06/30/22   Rocky Morel, DO  Continuous Blood Gluc Receiver DEVI 1 Units by Does not apply route 4 (four) times daily -  before meals and at bedtime. May substitute for cheapest monitor with insurance 03/27/21   Ilene Qua, MD  Continuous Blood Gluc Sensor (DEXCOM G7 SENSOR) MISC 1 Device by Does not apply route as directed. 10/13/21   Shamleffer, Konrad Dolores, MD  ethyl chloride spray Apply 1 Application topically every Monday, Wednesday, and Friday with hemodialysis. Apply to arm 05/09/22   [provider]  glucose blood (FREESTYLE TEST STRIPS) test strip Use as instructed 02/25/21   Dahal, Melina Schools, MD  hydrALAZINE (APRESOLINE) 50 MG tablet TAKE 1 TAB TWICE A DAY, MAY TAKE 1 EXTRA TAB DAILY ONLY AS NEEDED FOR ELEVATED BLOOD PRESSURE Patient  taking differently: Take 100 mg by mouth 3 (three) times daily. 07/12/22   Quintella Reichert, MD  insulin degludec (TRESIBA FLEXTOUCH) 200 UNIT/ML FlexTouch Pen Inject 18 Units into the skin daily in the afternoon. 11/02/22   Shamleffer, Konrad Dolores, MD  insulin lispro (HUMALOG KWIKPEN) 100 UNIT/ML KwikPen Max daily 40 units 08/26/22   Shamleffer, Konrad Dolores, MD  Insulin Pen Needle 32G X 4 MM MISC 1 Device by Does not apply route in the morning, at noon, in the evening, and at bedtime. 08/26/22   Shamleffer, Konrad Dolores, MD  losartan (COZAAR) 25 MG tablet Take 25 mg by mouth See admin instructions. Take with 50 for a total of 75 mg at bedtime 08/06/22   [provider]  losartan (COZAAR) 50 MG tablet Take 50 mg by mouth See admin instructions. Take with 25 mg for  a total of 75 mg at bedtime 08/06/22   [provider]  metoCLOPramide (REGLAN) 10 MG tablet Take 1 tablet (10 mg total) by mouth every 6 (six) hours. Patient taking differently: Take 10 mg by mouth every 6 (six) hours as needed for nausea or vomiting. 12/05/21   Edwin Dada P, DO  mirtazapine (REMERON) 15 MG tablet Take 1 tablet (15 mg total) by mouth at bedtime. Do not take medicine in the morning and drive. 10/05/22 10/05/23  Faith Rogue, DO  multivitamin (RENA-VIT) TABS tablet Take 1 tablet by mouth at bedtime.  03/08/20   Almon Hercules, MD  ondansetron (ZOFRAN-ODT) 8 MG disintegrating tablet Take 1 tablet (8 mg total) by mouth every 8 (eight) hours as needed for nausea or vomiting. 07/31/21   Dione Booze, MD  SENSIPAR 30 MG tablet Take 30 mg by mouth daily. 04/29/22   [provider]  sucroferric oxyhydroxide (VELPHORO) 500 MG chewable tablet Chew 1,500 mg by mouth 3 (three) times daily with meals.    [provider]      Allergies    Patient has no known allergies.    Review of Systems   Review of Systems  Physical Exam Updated Vital Signs BP 113/71 (BP Location: Left Arm)   Pulse 98    Temp 98.1 F (36.7 C)   Resp 17   Ht 5\' 6"  (1.676 m)   Wt 62.8 kg   SpO2 100%   BMI 22.35 kg/m  Physical Exam  ED Results / Procedures / Treatments   Labs (all labs ordered are listed, but only abnormal results are displayed) Labs Reviewed - No data to display  EKG None  Radiology No results found.  Procedures Procedures  {Document cardiac monitor, telemetry assessment procedure when appropriate:1}  Medications Ordered in ED Medications - No data to display  ED Course/ Medical Decision Making/ A&P   {   Click here for ABCD2, HEART and other calculatorsREFRESH Note before signing :1}                              Medical Decision Making Risk Decision regarding hospitalization.   ***  {Document critical care time when appropriate:1} {Document review of labs and clinical decision tools ie heart score, Chads2Vasc2 etc:1}  {Document your independent review of radiology images, and any outside records:1} {Document your discussion with family members, caretakers, and with consultants:1} {Document social determinants of health affecting pt's care:1} {Document your decision making why or why not admission, treatments were needed:1} Final Clinical Impression(s) / ED Diagnoses Final diagnoses:  Problem with vascular access    Rx / DC Orders ED Discharge Orders     None

## 2022-11-27 NOTE — ED Triage Notes (Signed)
Today pt son accidentally pulled out pt dialysis catheter to left chest. Pt dose dialysis at home and was able to complete all of treatment except for 3 minutes. Bleeding is controlled at this time.

## 2022-11-27 NOTE — ED Provider Notes (Signed)
She has not [place] EMERGENCY DEPARTMENT AT [facility] Provider Note   CSN: [id number] Arrival date & time: 11/27/22  2202     History  Chief Complaint  Patient presents with   Vascular Access Problem    Catheter came out    Barbara Donovan is a 32 y.o. female.  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  Patient was finishing her hemodialysis at home this evening when she accidentally pulled the catheter out of her chest.  She denies any bleeding.  She states that she has hemodialysis at home 4 days weekly and was 30 minutes short of finishing her fourth session this week tonight.  She is scheduled with vascular surgery for the second stage of an AV fistula in her right arm later this month.  Patient currently denies weakness, shortness of breath, chest pain, abdominal pain, nausea, vomiting.  Barbara Donovan        Home Medications Prior to Admission medications   Medication Sig Start Date End Date Taking? Authorizing Provider  acetaminophen (TYLENOL) 325 MG tablet Take 650 mg by mouth every 6 (six) hours as needed for mild pain.   Yes [provider]  amLODipine (NORVASC) 10 MG tablet Take 1 tablet (10 mg total) by mouth daily. Patient taking differently: Take 10 mg by mouth every evening. 03/09/20  Yes Candelaria Stagers T, MD  calcitRIOL (ROCALTROL) 0.5 MCG capsule Take 0.5 mcg by mouth every Monday, Wednesday, and Friday with hemodialysis. 12/02/20  Yes [provider]  carvedilol (COREG) 25 MG tablet Take 1 tablet (25 mg total) by mouth 2 (two) times daily. 03/07/19  Yes Dyann Kief, PA-C  ethyl chloride spray Apply 1 Application topically every Monday, Wednesday, and Friday with hemodialysis. Apply to arm 05/09/22  Yes [provider]  hydrALAZINE (APRESOLINE)  50 MG tablet TAKE 1 TAB TWICE A DAY, MAY TAKE 1 EXTRA TAB DAILY ONLY AS NEEDED FOR ELEVATED BLOOD PRESSURE Patient taking differently: Take 100 mg by mouth 3 (three) times daily. 07/12/22  Yes Turner, Cornelious Bryant, MD  insulin degludec (TRESIBA FLEXTOUCH) 200 UNIT/ML FlexTouch Pen Inject 18 Units into the skin daily in the afternoon. 11/02/22  Yes Shamleffer, Konrad Dolores, MD  insulin lispro (HUMALOG KWIKPEN) 100 UNIT/ML KwikPen Max daily 40 units Patient taking differently: Inject 6 Units into the skin 3 (three) times daily with meals. Max daily 40 units 08/26/22  Yes Shamleffer, Konrad Dolores, MD  losartan (COZAAR) 100 MG tablet Take 100 mg by mouth daily. 08/06/22  Yes [provider]  metoCLOPramide (REGLAN) 10 MG tablet Take 1 tablet (10 mg total) by mouth every 6 (six) hours. Patient taking differently: Take 10 mg by mouth every 6 (six) hours as needed for nausea or vomiting. 12/05/21  Yes Edwin Dada P, DO  mirtazapine (REMERON) 15 MG tablet Take 1 tablet (15 mg total) by mouth at bedtime. Do not take medicine in the morning and drive. 10/05/22 10/05/23 Yes Bender, Irving Burton, DO  multivitamin (RENA-VIT) TABS tablet Take 1 tablet by mouth at bedtime. 03/08/20  Yes Almon Hercules, MD  ondansetron (ZOFRAN-ODT) 8 MG disintegrating tablet Take 1 tablet (8 mg total) by mouth every 8 (eight) hours as needed for nausea or vomiting. 07/31/21  Yes Dione Booze, MD  SENSIPAR 30 MG tablet Take 30 mg by  mouth daily. 04/29/22  Yes [provider]  sucroferric oxyhydroxide (VELPHORO) 500 MG chewable tablet Chew 1,500 mg by mouth 3 (three) times daily with meals.   Yes [provider]  Continuous Blood Gluc Receiver DEVI 1 Units by Does not apply route 4 (four) times daily -  before meals and at bedtime. May substitute for cheapest monitor with insurance 03/27/21   Ilene Qua, MD  Continuous Blood Gluc Sensor (DEXCOM G7 SENSOR) MISC 1 Device by Does not apply route as directed. 10/13/21    Shamleffer, Konrad Dolores, MD  glucose blood (FREESTYLE TEST STRIPS) test strip Use as instructed 02/25/21   Dahal, Melina Schools, MD  Insulin Pen Needle 32G X 4 MM MISC 1 Device by Does not apply route in the morning, at noon, in the evening, and at bedtime. 08/26/22   Shamleffer, Konrad Dolores, MD  losartan (COZAAR) 50 MG tablet Take 50 mg by mouth See admin instructions. Take with 25 mg for  a total of 75 mg at bedtime 08/06/22   [provider]      Allergies    Patient has no known allergies.    Review of Systems   Review of Systems  Physical Exam Updated Vital Signs BP (!) 145/70   Pulse 88   Temp 98 F (36.7 C) (Oral)   Resp 13   Ht 5\' 6"  (1.676 m)   Wt 62.8 kg   SpO2 100%   BMI 22.35 kg/m  Physical Exam Vitals and nursing note reviewed.  HENT:     Head: Normocephalic and atraumatic.  Eyes:     Pupils: Pupils are equal, round, and reactive to light.  Cardiovascular:     Rate and Rhythm: Normal rate.  Pulmonary:     Effort: Pulmonary effort is normal. No respiratory distress.  Musculoskeletal:        General: No signs of injury.     Cervical back: Normal range of motion.  Skin:    General: Skin is dry.     Comments: Tunneled catheter site noted in left chest with no bleeding, no surrounding erythema  Neurological:     Mental Status: She is alert.  Psychiatric:        Speech: Speech normal.        Behavior: Behavior normal.     ED Results / Procedures / Treatments   Labs (all labs ordered are listed, but only abnormal results are displayed) Labs Reviewed  RENAL FUNCTION PANEL - Abnormal; Notable for the following components:      Result Value   Sodium 132 (*)    Chloride 90 (*)    Glucose, Bld 116 (*)    BUN 37 (*)    Creatinine, Ser 7.57 (*)    GFR, Estimated 7 (*)    All other components within normal limits  CBC WITH DIFFERENTIAL/PLATELET - Abnormal; Notable for the following components:   RBC 2.47 (*)    Hemoglobin 7.0 (*)    HCT 22.2 (*)     RDW 19.9 (*)    All other components within normal limits  CBG MONITORING, ED - Abnormal; Notable for the following components:   Glucose-Capillary 122 (*)    All other components within normal limits  CBC WITH DIFFERENTIAL/PLATELET  HCG, SERUM, QUALITATIVE  HIV ANTIBODY (ROUTINE TESTING W REFLEX)    EKG None  Radiology DG Chest Portable 1 View  Result Date: 11/28/2022 CLINICAL DATA:  Dialysis catheter problem. EXAM: PORTABLE CHEST 1 VIEW COMPARISON:  06/28/2022. FINDINGS: The  heart size and mediastinal contours are within normal limits. No consolidation, effusion, or pneumothorax. No acute osseous abnormality. IMPRESSION: No active disease. Electronically Signed   By: Thornell Sartorius M.D.   On: 11/28/2022 00:48    Procedures Ultrasound ED Peripheral IV (Provider)  Date/Time: 11/28/2022 1:15 AM  Performed by: Darrick Grinder, PA-C Authorized by: Darrick Grinder, PA-C   Procedure details:    Indications: poor IV access     Skin Prep: chlorhexidine gluconate     Location: Left upper arm.   Angiocath:  20 G   Bedside Ultrasound Guided: Yes     Images: archived     Patient tolerated procedure without complications: Yes     Dressing applied: Yes       Medications Ordered in ED Medications  heparin injection 5,000 Units (has no administration in time range)  acetaminophen (TYLENOL) tablet 650 mg (has no administration in time range)    Or  acetaminophen (TYLENOL) suppository 650 mg (has no administration in time range)  senna-docusate (Senokot-S) tablet 1 tablet (has no administration in time range)  insulin aspart (novoLOG) injection 0-15 Units (has no administration in time range)  insulin aspart (novoLOG) injection 0-5 Units ( Subcutaneous Not Given 11/28/22 0215)    ED Course/ Medical Decision Making/ A&P                                 Medical Decision Making Amount and/or Complexity of Data Reviewed Labs: ordered. Radiology: ordered.  Risk Decision  regarding hospitalization.   This patient presents to the ED for concern of AV access issue, this involves an extensive number of treatment options, and is a complaint that carries with it a high risk of complications and morbidity.    Co morbidities that complicate the patient evaluation  End-stage renal disease on home hemodialysis, type I DM   Additional history obtained:   External records from outside source obtained and reviewed including vascular surgery notes   Lab Tests:  I Ordered, and personally interpreted labs.  The pertinent results include: Hemoglobin 7.0 (consistent with patient's recently reported outpatient hemoglobin of approximately 7)   Imaging Studies ordered:  I ordered imaging studies including chest x-ray I independently visualized and interpreted imaging which showed no active disease I agree with the radiologist interpretation   Cardiac Monitoring: / EKG:  The patient was maintained on a cardiac monitor.  I personally viewed and interpreted the cardiac monitored which showed an underlying rhythm of: sinus rhythm   Consultations Obtained:  I requested consultation with the vascular surgeon, Dr. Hetty Blend,  and discussed lab and imaging findings as well as pertinent plan - they recommend: Admission to medicine for likely replacement of catheter either tomorrow or Monday  I requested consultation with the family medicine   Social Determinants of Health:  Social Determinants of Health with Concerns   Physical Activity: Inactive (07/06/2022)   Exercise Vital Sign    Days of Exercise per Week: 0 days    Minutes of Exercise per Session: 0 min  Social Connections: Socially Isolated (07/06/2022)   Social Connection and Isolation Panel [NHANES]    Frequency of Communication with Friends and Family: Once a week    Frequency of Social Gatherings with Friends and Family: Never    Attends Religious Services: More than 4 times per year    Active Member of  [employer] or Organizations: No    Attends Club or  Organization Meetings: Never    Marital Status: Never married  Depression (PHQ2-9): Medium Risk (09/09/2022)   Depression (PHQ2-9)    PHQ-2 Score: 9  Health Literacy: Not on file      Test / Admission - Considered:  Patient will need admission until catheter is able to be placed by vascular surgery.  Patient may also benefit from trending of hemoglobin with hemoglobin being 7.0 at time of admission.         Final Clinical Impression(s) / ED Diagnoses Final diagnoses:  Problem with vascular access    Rx / DC Orders ED Discharge Orders     None         Pamala Duffel 11/28/22 0220    Shon Baton, MD 11/28/22 [phone]

## 2022-11-28 ENCOUNTER — Emergency Department (HOSPITAL_COMMUNITY): Payer: Medicare Other

## 2022-11-28 DIAGNOSIS — Z8249 Family history of ischemic heart disease and other diseases of the circulatory system: Secondary | ICD-10-CM | POA: Diagnosis not present

## 2022-11-28 DIAGNOSIS — Y712 Prosthetic and other implants, materials and accessory cardiovascular devices associated with adverse incidents: Secondary | ICD-10-CM | POA: Diagnosis present

## 2022-11-28 DIAGNOSIS — Z992 Dependence on renal dialysis: Secondary | ICD-10-CM

## 2022-11-28 DIAGNOSIS — Z823 Family history of stroke: Secondary | ICD-10-CM | POA: Diagnosis not present

## 2022-11-28 DIAGNOSIS — F419 Anxiety disorder, unspecified: Secondary | ICD-10-CM | POA: Diagnosis present

## 2022-11-28 DIAGNOSIS — I82C11 Acute embolism and thrombosis of right internal jugular vein: Secondary | ICD-10-CM | POA: Diagnosis present

## 2022-11-28 DIAGNOSIS — N186 End stage renal disease: Secondary | ICD-10-CM | POA: Diagnosis present

## 2022-11-28 DIAGNOSIS — E103413 Type 1 diabetes mellitus with severe nonproliferative diabetic retinopathy with macular edema, bilateral: Secondary | ICD-10-CM | POA: Diagnosis present

## 2022-11-28 DIAGNOSIS — Z833 Family history of diabetes mellitus: Secondary | ICD-10-CM | POA: Diagnosis not present

## 2022-11-28 DIAGNOSIS — I120 Hypertensive chronic kidney disease with stage 5 chronic kidney disease or end stage renal disease: Secondary | ICD-10-CM | POA: Diagnosis present

## 2022-11-28 DIAGNOSIS — F32A Depression, unspecified: Secondary | ICD-10-CM | POA: Diagnosis present

## 2022-11-28 DIAGNOSIS — Z79899 Other long term (current) drug therapy: Secondary | ICD-10-CM | POA: Diagnosis not present

## 2022-11-28 DIAGNOSIS — Z825 Family history of asthma and other chronic lower respiratory diseases: Secondary | ICD-10-CM | POA: Diagnosis not present

## 2022-11-28 DIAGNOSIS — T8242XA Displacement of vascular dialysis catheter, initial encounter: Secondary | ICD-10-CM | POA: Diagnosis present

## 2022-11-28 DIAGNOSIS — E1022 Type 1 diabetes mellitus with diabetic chronic kidney disease: Secondary | ICD-10-CM | POA: Diagnosis present

## 2022-11-28 DIAGNOSIS — J45909 Unspecified asthma, uncomplicated: Secondary | ICD-10-CM | POA: Diagnosis present

## 2022-11-28 DIAGNOSIS — Z794 Long term (current) use of insulin: Secondary | ICD-10-CM | POA: Diagnosis not present

## 2022-11-28 DIAGNOSIS — D631 Anemia in chronic kidney disease: Secondary | ICD-10-CM | POA: Diagnosis present

## 2022-11-28 LAB — RENAL FUNCTION PANEL
Albumin: 3.9 g/dL (ref 3.5–5.0)
Anion gap: 15 (ref 5–15)
BUN: 37 mg/dL — ABNORMAL HIGH (ref 6–20)
CO2: 27 mmol/L (ref 22–32)
Calcium: 9.9 mg/dL (ref 8.9–10.3)
Chloride: 90 mmol/L — ABNORMAL LOW (ref 98–111)
Creatinine, Ser: 7.57 mg/dL — ABNORMAL HIGH (ref 0.44–1.00)
GFR, Estimated: 7 mL/min — ABNORMAL LOW (ref >60)
Glucose, Bld: 116 mg/dL — ABNORMAL HIGH (ref 70–99)
Phosphorus: 4 mg/dL (ref 2.5–4.6)
Potassium: 3.9 mmol/L (ref 3.5–5.1)
Sodium: 132 mmol/L — ABNORMAL LOW (ref 135–145)

## 2022-11-28 LAB — GLUCOSE, CAPILLARY
Glucose-Capillary: 198 mg/dL — ABNORMAL HIGH (ref 70–99)
Glucose-Capillary: 135 mg/dL — ABNORMAL HIGH (ref 70–99)
Glucose-Capillary: 146 mg/dL — ABNORMAL HIGH (ref 70–99)
Glucose-Capillary: 142 mg/dL — ABNORMAL HIGH (ref 70–99)
Glucose-Capillary: 199 mg/dL — ABNORMAL HIGH (ref 70–99)

## 2022-11-28 LAB — IRON AND TIBC
Iron: 60 ug/dL (ref 28–170)
Saturation Ratios: 27 % (ref 10.4–31.8)
TIBC: 223 ug/dL — ABNORMAL LOW (ref 250–450)
UIBC: 163 ug/dL

## 2022-11-28 LAB — CBC WITH DIFFERENTIAL/PLATELET
Abs Immature Granulocytes: 0.02 10*3/uL (ref 0.00–0.07)
Basophils Absolute: 0.1 10*3/uL (ref 0.0–0.1)
Basophils Relative: 1 %
Eosinophils Absolute: 0.4 10*3/uL (ref 0.0–0.5)
Eosinophils Relative: 7 %
HCT: 22.2 % — ABNORMAL LOW (ref 36.0–46.0)
Hemoglobin: 7 g/dL — ABNORMAL LOW (ref 12.0–15.0)
Immature Granulocytes: 0 %
Lymphocytes Relative: 28 %
Lymphs Abs: 1.5 10*3/uL (ref 0.7–4.0)
MCH: 28.3 pg (ref 26.0–34.0)
MCHC: 31.5 g/dL (ref 30.0–36.0)
MCV: 89.9 fL (ref 80.0–100.0)
Monocytes Absolute: 0.6 10*3/uL (ref 0.1–1.0)
Monocytes Relative: 12 %
Neutro Abs: 2.8 10*3/uL (ref 1.7–7.7)
Neutrophils Relative %: 52 %
Platelets: 177 10*3/uL (ref 150–400)
RBC: 2.47 MIL/uL — ABNORMAL LOW (ref 3.87–5.11)
RDW: 19.9 % — ABNORMAL HIGH (ref 11.5–15.5)
WBC: 5.4 10*3/uL (ref 4.0–10.5)
nRBC: 0 % (ref 0.0–0.2)

## 2022-11-28 LAB — CBG MONITORING, ED: Glucose-Capillary: 122 mg/dL — ABNORMAL HIGH (ref 70–99)

## 2022-11-28 LAB — CBC
HCT: 22.8 % — ABNORMAL LOW (ref 36.0–46.0)
Hemoglobin: 7.2 g/dL — ABNORMAL LOW (ref 12.0–15.0)
MCH: 28.2 pg (ref 26.0–34.0)
MCHC: 31.6 g/dL (ref 30.0–36.0)
MCV: 89.4 fL (ref 80.0–100.0)
Platelets: 192 10*3/uL (ref 150–400)
RBC: 2.55 MIL/uL — ABNORMAL LOW (ref 3.87–5.11)
RDW: 20.5 % — ABNORMAL HIGH (ref 11.5–15.5)
WBC: 5.1 10*3/uL (ref 4.0–10.5)
nRBC: 0 % (ref 0.0–0.2)

## 2022-11-28 LAB — FERRITIN: Ferritin: 318 ng/mL — ABNORMAL HIGH (ref 11–307)

## 2022-11-28 MED ORDER — SUCROFERRIC OXYHYDROXIDE 500 MG PO CHEW
500.0000 mg | CHEWABLE_TABLET | Freq: Three times a day (TID) | ORAL | Status: DC
  Administered 2022-11-28 – 2022-11-29 (×3): 500 mg via ORAL
  Filled 2022-11-28 (×3): qty 1

## 2022-11-28 MED ORDER — CINACALCET HCL 30 MG PO TABS
30.0000 mg | ORAL_TABLET | Freq: Every day | ORAL | Status: DC
  Administered 2022-11-28 – 2022-11-29 (×2): 30 mg via ORAL
  Filled 2022-11-28 (×2): qty 1

## 2022-11-28 MED ORDER — ACETAMINOPHEN 650 MG RE SUPP: 650.0000 mg | Freq: Four times a day (QID) | RECTAL | Status: DC | PRN

## 2022-11-28 MED ORDER — INSULIN ASPART 100 UNIT/ML IJ SOLN
0.0000 [IU] | Freq: Three times a day (TID) | INTRAMUSCULAR | Status: DC
  Administered 2022-11-28 – 2022-11-29 (×4): 2 [IU] via SUBCUTANEOUS

## 2022-11-28 MED ORDER — CARVEDILOL 25 MG PO TABS
25.0000 mg | ORAL_TABLET | Freq: Two times a day (BID) | ORAL | Status: DC
  Administered 2022-11-28 – 2022-11-29 (×3): 25 mg via ORAL
  Filled 2022-11-28 (×4): qty 1

## 2022-11-28 MED ORDER — INSULIN ASPART 100 UNIT/ML IJ SOLN: 0.0000 [IU] | Freq: Every day | INTRAMUSCULAR | Status: DC

## 2022-11-28 MED ORDER — SENNOSIDES-DOCUSATE SODIUM 8.6-50 MG PO TABS: 1.0000 | ORAL_TABLET | Freq: Every evening | ORAL | Status: DC | PRN

## 2022-11-28 MED ORDER — ACETAMINOPHEN 325 MG PO TABS: 650.0000 mg | ORAL_TABLET | Freq: Four times a day (QID) | ORAL | Status: DC | PRN

## 2022-11-28 MED ORDER — MIRTAZAPINE 15 MG PO TBDP
15.0000 mg | ORAL_TABLET | Freq: Every day | ORAL | Status: DC
  Administered 2022-11-28 – 2022-11-29 (×2): 15 mg via ORAL
  Filled 2022-11-28 (×2): qty 1

## 2022-11-28 MED ORDER — HEPARIN SODIUM (PORCINE) 5000 UNIT/ML IJ SOLN
5000.0000 [IU] | Freq: Three times a day (TID) | INTRAMUSCULAR | Status: DC
  Administered 2022-11-28 – 2022-11-29 (×3): 5000 [IU] via SUBCUTANEOUS
  Filled 2022-11-28 (×3): qty 1

## 2022-11-28 NOTE — ED Notes (Signed)
Surgeon at bedside.  

## 2022-11-28 NOTE — Hospital Course (Signed)
Barbara Donovan is a 32 y.o. female with pertinent PMH of ESRD on home HD, type 1 diabetes, and hypertension who presented to the emergency department after her HD catheter was pulled out accidentally and is admitted for placement of a tunneled dialysis catheter.  Please see problem based assessment and plan for more information regarding her hospital stay.  #Need for Dialysis Access  #Accidental removeal of tunneled HD cath #ESRD on home intermittent HD Vascular surgery/IR unable to place tunneled dialysis catheter on Sunday, 11/28/2022.  Vascular surgery replace catheter on 11/29/2022 without complication***.  Per nephrology, no need for inpatient dialysis, can go home following tunneled dialysis catheter placement and do home dialysis. Following with transplant and soon to undergo revision of AVF.   #Hypertension Currently takes amlodipine 10 mg daily, carvedilol 25 mg twice daily, hydralazine 50 mg twice daily, and losartan 100 mg daily.   #Type 1 diabetes Patient notes that she currently takes 18 units of Tresiba in the afternoon and 6 units of Humalog 3 times per day before meals. - On SSI  this admission    #Normocytic Anemia  Noted to have a hemoglobin of 7.0 on admission. Hemoglobin down to 6.9 and received one unit of blood. Post transfusion hemoglobin WNL ***. This admission  Iron panel most consistent with ESRD/anemia of chronic disease.   - May benefit from ESA outpatient

## 2022-11-28 NOTE — ED Notes (Signed)
ED TO INPATIENT HANDOFF REPORT  ED Nurse Name and Phone #: Ines Bloomer [phone]   S Name/Age/Gender Barbara Donovan 32 y.o. female Room/Bed: 025C/025C  Code Status   Code Status: Full Code  Home/SNF/Other Home Patient oriented to: self, place, time, and situation Is this baseline? Yes   Triage Complete: Triage complete  Chief Complaint Displacement of vascular dialysis catheter [facility]) [T82.42XA]  Triage Note Today pt son accidentally pulled out pt dialysis catheter to left chest. Pt dose dialysis at home and was able to complete all of treatment except for 3 minutes. Bleeding is controlled at this time.   Allergies No Known Allergies  Level of Care/Admitting Diagnosis ED Disposition     ED Disposition  Admit   Condition  --   Comment  Hospital Area: [facility] [[id number]]  Level of Care: Med-Surg [16]  May place patient in observation at [facility] or [facility] if equivalent level of care is available:: No  Covid Evaluation: Asymptomatic - no recent exposure (last 10 days) testing not required  Diagnosis: Displacement of vascular dialysis catheter [facility]) [[id number]]  Admitting Physician: Reymundo Poll [[id number]]  Attending Physician: Reymundo Poll [[id number]]          B Medical/Surgery History Past Medical History:  Diagnosis Date   Anxiety    Asthma    as a child, no problems as an adult, no inhaler   Cataract    NS OU   Chronic hypertension during pregnancy, antepartum 08/19/2017   Dehydration 01/28/2018   Depression    Depression during pregnancy, antepartum 07/07/2017   6/20: Short trial of zoloft previously, reports didn't help much but also didn't give it a chance Discussed r/b/a SSRIs in pregnancy, agrees to try Zoloft again, rx sent No SI/HI/red flags   Diabetes (HCC)    TYPE I. 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 A2/B GDM)  Required Referrals for A1GDM or A2GDM: [x]  Diabetes Education and Testing Supplies [x]  Nutrition Cousult  For A2/B GDM or higher classes of DM [x]  Diabetes Education and Testing Supplies [x]  Nutrition Counsult [x]  Fetal ECHO after 20 weeks  [x]  Eye exam for retina evaluation - severe retinopathy 7/19  Base   Ventricular septal defect (VSD) of fetus in singleton pregnancy, antepartum 09/30/2017   May go to newborn nursery per Dr. Sadler Bing Echo prior to discharge   Past Surgical History:  Procedure Laterality Date   25 GAUGE PARS PLANA VITRECTOMY WITH 20 GAUGE MVR PORT FOR MACULAR HOLE Left 07/20/2018   Procedure: 25 GAUGE PARS PLANA VITRECTOMY LEFT EYE;  Surgeon: Rennis Chris, MD;  Location: [facility] OR;  Service: Ophthalmology;  Laterality: Left;   AV FISTULA PLACEMENT Left 08/11/2021   Procedure: INSERTION OF LEFT ARM ARTERIOVENOUS (AV) GORE-TEX GRAFT;  Surgeon: Victorino Sparrow, MD;  Location: [facility] OR;  Service: Vascular;  Laterality: Left;  PERIPHERAL  NERVE BLOCK   AV FISTULA PLACEMENT Right 08/24/2022   Procedure: RIGHT UPPER EXTREMITY ARTERIOVENOUS (AV) FISTULA CREATION;  Surgeon: Chuck Hint, MD;  Location: [facility] OR;  Service: Vascular;  Laterality: Right;   AVGG REMOVAL Left 06/22/2022   Procedure: REMOVAL OF ARTERIOVENOUS GORETEX GRAFT (AVGG) LEFT ARM WITH VEIN PATCH OF BRACHIAL ARTERY;  Surgeon: Chuck Hint, MD;  Location: [facility] OR;  Service: Vascular;   Laterality: Left;   CAPD INSERTION N/A 09/10/2020   Procedure: LAPAROSCOPIC INSERTION CONTINUOUS AMBULATORY PERITONEAL DIALYSIS  (CAPD) CATHETER;  Surgeon: Nada Libman, MD;  Location: [facility] OR;  Service: Vascular;  Laterality: N/A;   CESAREAN SECTION N/A 12/26/2017   Procedure: CESAREAN SECTION;  Surgeon: Tereso Newcomer, MD;  Location: [facility];  Service: Obstetrics;  Laterality: N/A;   EYE EXAMINATION UNDER ANESTHESIA Right 07/20/2018   Procedure: Eye Exam Under Anesthesia RIGHT EYE;  Surgeon: Rennis Chris, MD;  Location: [facility] OR;  Service: Ophthalmology;  Laterality: Right;   EYE SURGERY Left 07/2018   GAS INSERTION Left 07/19/2019   Procedure: INSERTION OF GAS;  Surgeon: Rennis Chris, MD;  Location: [facility] OR;  Service: Ophthalmology;  Laterality: Left;  SF6   INJECTION OF SILICONE OIL Left 07/20/2018   Procedure: Injection Of Silicone Oil LEFT EYE;  Surgeon: Rennis Chris, MD;  Location: [facility] OR;  Service: Ophthalmology;  Laterality: Left;   INSERTION OF DIALYSIS CATHETER Right 07/05/2021   Procedure: REMOVAL OF DIALYSIS CATHETER;  Surgeon: Leonie Douglas, MD;  Location: [facility] OR;  Service: Vascular;  Laterality: Right;   INSERTION OF DIALYSIS CATHETER Right 07/09/2021   Procedure: INSERTION OF TUNNELED DIALYSIS CATHETER;  Surgeon: Leonie Douglas, MD;  Location: [facility] OR;  Service: Vascular;  Laterality: Right;   INSERTION OF DIALYSIS CATHETER Left 07/09/2021   Procedure: INSERTION OF TUNNELED  DIALYSIS CATHETER;  Surgeon: Leonie Douglas, MD;  Location: [facility] OR;  Service: Vascular;  Laterality: Left;   INSERTION OF DIALYSIS CATHETER N/A 06/22/2022   Procedure: INSERTION OF LEFT INTERNAL JUGULAR TRIALYSIS DIALYSIS CATHETER;  Surgeon: Chuck Hint, MD;  Location: [facility] OR;  Service: Vascular;  Laterality: N/A;   INSERTION OF DIALYSIS CATHETER Left 06/28/2022   Procedure: ULTRASOUND GUIDED INSERTION OF TUNNELED DIALYSIS CATHETER;  Surgeon: Leonie Douglas, MD;  Location: [facility] OR;  Service:  Vascular;  Laterality: Left;   IR FLUORO GUIDE CV LINE RIGHT  06/02/2020   IR US GUIDE VASC ACCESS RIGHT  06/02/2020   LASER PHOTO ABLATION Right 07/20/2018   Procedure: Laser Photo Ablation RIGHT EYE;  Surgeon: Rennis Chris, MD;  Location: [facility] OR;  Service: Ophthalmology;  Laterality: Right;   MEMBRANE PEEL Left 07/20/2018   Procedure: Membrane Peel LEFT EYE;  Surgeon: Rennis Chris, MD;  Location: [facility] OR;  Service: Ophthalmology;  Laterality: Left;   MEMBRANE PEEL Left 07/19/2019   Procedure: MEMBRANE PEEL;  Surgeon: Rennis Chris, MD;  Location: [facility] OR;  Service: Ophthalmology;  Laterality: Left;   MITOMYCIN C APPLICATION Bilateral 07/20/2018   Procedure: Avastin Application;  Surgeon: Rennis Chris, MD;  Location: [facility] OR;  Service: Ophthalmology;  Laterality: Bilateral;   PHOTOCOAGULATION WITH LASER Left 07/20/2018   Procedure: Photocoagulation With Laser LEFT EYE;  Surgeon: Rennis Chris, MD;  Location: [facility] OR;  Service: Ophthalmology;  Laterality: Left;   PHOTOCOAGULATION WITH LASER Left 07/19/2019   Procedure: PHOTOCOAGULATION WITH LASER;  Surgeon: Rennis Chris, MD;  Location: [facility] OR;  Service: Ophthalmology;  Laterality: Left;   RETINAL DETACHMENT SURGERY Left 07/20/2018  Dr. Rennis Chris   SILICON OIL REMOVAL Left 07/19/2019   Procedure: 25g PARS PLANA VITRECTOMY WITH SILICON OIL REMOVAL;  Surgeon: Rennis Chris, MD;  Location: [facility] OR;  Service: Ophthalmology;  Laterality: Left;   TEE WITHOUT CARDIOVERSION N/A 07/08/2021   Procedure: TRANSESOPHAGEAL ECHOCARDIOGRAM (TEE);  Surgeon: Little Ishikawa, MD;  Location: [facility] ENDOSCOPY;  Service: Cardiovascular;  Laterality: N/A;   TEE WITHOUT CARDIOVERSION N/A 06/29/2022   Procedure: TRANSESOPHAGEAL ECHOCARDIOGRAM;  Surgeon: Wendall Stade, MD;  Location: [facility] INVASIVE CV LAB;  Service: Cardiovascular;  Laterality: N/A;   WISDOM TOOTH EXTRACTION       A IV Location/Drains/Wounds Patient Lines/Drains/Airways Status     Active Line/Drains/Airways      Name Placement date Placement time Site Days   Peripheral IV 11/28/22 20 G 1.16" Anterior;Left;Proximal;Upper Arm 11/28/22  0122  Arm  less than 1   Fistula / Graft Left Upper arm Arteriovenous vein graft 08/11/21  1103  Upper arm  474   Fistula / Graft Right Upper arm Arteriovenous fistula 08/24/22  1247  Upper arm  96   Hemodialysis Catheter Left Internal jugular Double lumen Permanent (Tunneled) 06/28/22  0806  Internal jugular  153   Open Drain 1 Left;Anterior Other (Comment) 06/22/22  1147  Other (Comment)  159            Intake/Output Last 24 hours No intake or output data in the 24 hours ending 11/28/22 0146  Labs/Imaging No results found for this or any previous visit (from the past 48 hour(s)). DG Chest Portable 1 View  Result Date: 11/28/2022 CLINICAL DATA:  Dialysis catheter problem. EXAM: PORTABLE CHEST 1 VIEW COMPARISON:  06/28/2022. FINDINGS: The heart size and mediastinal contours are within normal limits. No consolidation, effusion, or pneumothorax. No acute osseous abnormality. IMPRESSION: No active disease. Electronically Signed   By: Thornell Sartorius M.D.   On: 11/28/2022 00:48    Pending Labs Unresulted Labs (From admission, onward)     Start     Ordered   11/28/22 0200  hCG, serum, qualitative  Once,   R        11/28/22 0200   11/28/22 0135  HIV Antibody (routine testing w rflx)  (HIV Antibody (Routine testing w reflex) panel)  Once,   R        11/28/22 0135   11/28/22 0004  Renal function panel  Once,   URGENT        11/28/22 0003   11/28/22 0004  CBC with Differential  Once,   STAT        11/28/22 0003            Vitals/Pain Today's Vitals   11/27/22 2230 11/27/22 2234 11/28/22 0145  BP: 113/71  (!) 145/70  Pulse: 98  88  Resp: 17  13  Temp: 98.1 F (36.7 C)  98 F (36.7 C)  TempSrc:   Oral  SpO2: 100%  100%  Weight:  62.8 kg   Height:  5\' 6"  (1.676 m)   PainSc:  0-No pain 0-No pain    Isolation Precautions No active  isolations  Medications Medications  heparin injection 5,000 Units (has no administration in time range)  acetaminophen (TYLENOL) tablet 650 mg (has no administration in time range)    Or  acetaminophen (TYLENOL) suppository 650 mg (has no administration in time range)  senna-docusate (Senokot-S) tablet 1 tablet (has no administration in time range)  insulin aspart (novoLOG) injection 0-15 Units (has no administration in time range)  insulin aspart (novoLOG) injection 0-5 Units (has no administration in time range)    Mobility walks     Focused Assessments See chart   R Recommendations: See Admitting Provider Note  Report given to:   Additional Notes: Pt is A & O x 4; ambulatory without assist

## 2022-11-28 NOTE — Consult Note (Signed)
Hospital Consult    Reason for Consult: Needs [facility] MRN #:  [id number]  History of Present Illness: This is a 32 y.o. female with a history of ESRD who performs home dialysis via a left IJ TDC.  Yesterday evening the catheter got stuck on the chair was pulled out.  She noted a minor amount of bleeding but the bleeding quickly stopped.  She does have a right brachiobasilic fistula but this has not been transposed.  Past Medical History:  Diagnosis Date   Anxiety    Asthma    as a child, no problems as an adult, no inhaler   Cataract    NS OU   Chronic hypertension during pregnancy, antepartum 08/19/2017   Dehydration 01/28/2018   Depression    Depression during pregnancy, antepartum 07/07/2017   6/20: Short trial of zoloft previously, reports didn't help much but also didn't give it a chance Discussed r/b/a SSRIs in pregnancy, agrees to try Zoloft again, rx sent No SI/HI/red flags   Diabetes (HCC)    TYPE I. 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 A2/B GDM)  Required Referrals for A1GDM or A2GDM: [x]  Diabetes Education and Testing Supplies [x]  Nutrition Cousult  For A2/B GDM or higher classes of DM [x]  Diabetes Education and Testing Supplies [x]  Nutrition Counsult [x]  Fetal ECHO after 20 weeks  [x]  Eye exam for retina evaluation - severe retinopathy 7/19   Base   Ventricular septal defect (VSD) of fetus in singleton pregnancy, antepartum 09/30/2017   May go to newborn nursery per Dr. Cochiti Bing Echo prior to discharge    Past Surgical History:  Procedure Laterality Date   25 GAUGE PARS PLANA VITRECTOMY WITH 20 GAUGE MVR PORT FOR MACULAR HOLE Left 07/20/2018   Procedure: 25 GAUGE PARS PLANA VITRECTOMY LEFT EYE;  Surgeon: Rennis Chris, MD;  Location: [facility] OR;  Service: Ophthalmology;  Laterality: Left;   AV FISTULA PLACEMENT Left 08/11/2021   Procedure: INSERTION OF LEFT ARM ARTERIOVENOUS (AV) GORE-TEX GRAFT;  Surgeon: Victorino Sparrow, MD;  Location: [facility] OR;  Service: Vascular;  Laterality: Left;  PERIPHERAL NERVE BLOCK   AV FISTULA PLACEMENT Right 08/24/2022   Procedure: RIGHT UPPER EXTREMITY ARTERIOVENOUS (AV) FISTULA CREATION;  Surgeon: Chuck Hint, MD;  Location[facility] OR;  Service: Vascular;  Laterality: Right;   AVGG REMOVAL Left 06/22/2022   Procedure: REMOVAL OF ARTERIOVENOUS GORETEX GRAFT (AVGG) LEFT ARM WITH VEIN PATCH OF BRACHIAL ARTERY;  Surgeon: Chuck Hint, MD;  Location: [facility] OR;  Service: Vascular;  Laterality: Left;   CAPD INSERTION N/A 09/10/2020   Procedure: LAPAROSCOPIC INSERTION CONTINUOUS AMBULATORY PERITONEAL DIALYSIS  (CAPD) CATHETER;  Surgeon: Nada Libman, MD;  Location: [facility] OR;  Service: Vascular;  Laterality: N/A;   CESAREAN SECTION N/A 12/26/2017   Procedure: CESAREAN SECTION;  Surgeon: Tereso Newcomer, MD;  Location: [facility];  Service: Obstetrics;  Laterality: N/A;   EYE EXAMINATION UNDER ANESTHESIA Right 07/20/2018   Procedure: Eye Exam Under Anesthesia RIGHT EYE;  Surgeon: Rennis Chris, MD;  Location: [facility] OR;  Service: Ophthalmology;  Laterality: Right;   EYE SURGERY Left 07/2018   GAS INSERTION Left 07/19/2019   Procedure: INSERTION OF GAS;  Surgeon: Rennis Chris, MD;  Location: [facility] OR;  Service: Ophthalmology;  Laterality: Left;  SF6   INJECTION OF SILICONE OIL Left 07/20/2018   Procedure: Injection  Of Silicone Oil LEFT EYE;  Surgeon: Rennis Chris, MD;  Location: [facility] OR;  Service: Ophthalmology;  Laterality: Left;   INSERTION OF DIALYSIS CATHETER Right 07/05/2021   Procedure: REMOVAL OF DIALYSIS CATHETER;  Surgeon: Leonie Douglas, MD;  Location: [facility] OR;  Service: Vascular;  Laterality: Right;   INSERTION OF DIALYSIS CATHETER Right 07/09/2021   Procedure: INSERTION OF TUNNELED DIALYSIS CATHETER;  Surgeon: Leonie Douglas, MD;  Location: [facility] OR;  Service: Vascular;  Laterality: Right;   INSERTION OF DIALYSIS CATHETER Left 07/09/2021   Procedure: INSERTION OF TUNNELED  DIALYSIS CATHETER;  Surgeon: Leonie Douglas, MD;  Location: [facility] OR;  Service: Vascular;  Laterality: Left;   INSERTION OF DIALYSIS CATHETER N/A 06/22/2022   Procedure: INSERTION OF LEFT INTERNAL JUGULAR TRIALYSIS DIALYSIS CATHETER;  Surgeon: Chuck Hint, MD;  Location: [facility] OR;  Service: Vascular;  Laterality: N/A;   INSERTION OF DIALYSIS CATHETER Left 06/28/2022   Procedure: ULTRASOUND GUIDED INSERTION OF TUNNELED DIALYSIS CATHETER;  Surgeon: Leonie Douglas, MD;  Location: [facility] OR;  Service: Vascular;  Laterality: Left;   IR FLUORO GUIDE CV LINE RIGHT  06/02/2020   IR US GUIDE VASC ACCESS RIGHT  06/02/2020   LASER PHOTO ABLATION Right 07/20/2018   Procedure: Laser Photo Ablation RIGHT EYE;  Surgeon: Rennis Chris, MD;  Location: [facility] OR;  Service: Ophthalmology;  Laterality: Right;   MEMBRANE PEEL Left 07/20/2018   Procedure: Membrane Peel LEFT EYE;  Surgeon: Rennis Chris, MD;  Location: [facility] OR;  Service: Ophthalmology;  Laterality: Left;   MEMBRANE PEEL Left 07/19/2019   Procedure: MEMBRANE PEEL;  Surgeon: Rennis Chris, MD;  Location: [facility] OR;  Service: Ophthalmology;  Laterality: Left;   MITOMYCIN C APPLICATION Bilateral 07/20/2018   Procedure: Avastin Application;  Surgeon: Rennis Chris, MD;  Location: [facility] OR;  Service: Ophthalmology;  Laterality: Bilateral;   PHOTOCOAGULATION WITH LASER Left 07/20/2018   Procedure: Photocoagulation  With Laser LEFT EYE;  Surgeon: Rennis Chris, MD;  Location: [facility] OR;  Service: Ophthalmology;  Laterality: Left;   PHOTOCOAGULATION WITH LASER Left 07/19/2019   Procedure: PHOTOCOAGULATION WITH LASER;  Surgeon: Rennis Chris, MD;  Location: [facility] OR;  Service: Ophthalmology;  Laterality: Left;   RETINAL DETACHMENT SURGERY Left 07/20/2018   Dr. Rennis Chris   SILICON OIL REMOVAL Left 07/19/2019   Procedure: 25g PARS PLANA VITRECTOMY WITH SILICON OIL REMOVAL;  Surgeon: Rennis Chris, MD;  Location: [facility] OR;  Service: Ophthalmology;  Laterality: Left;   TEE WITHOUT CARDIOVERSION N/A 07/08/2021   Procedure: TRANSESOPHAGEAL ECHOCARDIOGRAM (TEE);  Surgeon: Little Ishikawa, MD;  Location: [facility] ENDOSCOPY;  Service: Cardiovascular;  Laterality: N/A;   TEE WITHOUT CARDIOVERSION N/A 06/29/2022   Procedure: TRANSESOPHAGEAL ECHOCARDIOGRAM;  Surgeon: Wendall Stade, MD;  Location: [facility] INVASIVE CV LAB;  Service: Cardiovascular;  Laterality: N/A;   WISDOM TOOTH EXTRACTION      No Known Allergies  Prior to Admission medications   Medication Sig Start Date End Date Taking? Authorizing Provider  acetaminophen (TYLENOL) 325 MG tablet Take 650 mg by  mouth every 6 (six) hours as needed for mild pain.   Yes [provider]  amLODipine (NORVASC) 10 MG tablet Take 1 tablet (10 mg total) by mouth daily. Patient taking differently: Take 10 mg by mouth every evening. 03/09/20  Yes Candelaria Stagers T, MD  calcitRIOL (ROCALTROL) 0.5 MCG capsule Take 0.5 mcg by mouth every Monday, Wednesday, and Friday with hemodialysis. 12/02/20  Yes [provider]  carvedilol (COREG) 25 MG tablet Take 1 tablet (25 mg total) by mouth 2 (two) times daily. 03/07/19  Yes Dyann Kief, PA-C  ethyl chloride spray Apply 1 Application topically every Monday, Wednesday, and Friday with hemodialysis. Apply to arm 05/09/22  Yes [provider]  hydrALAZINE (APRESOLINE) 50 MG tablet TAKE 1 TAB TWICE A DAY, MAY TAKE 1 EXTRA TAB  DAILY ONLY AS NEEDED FOR ELEVATED BLOOD PRESSURE Patient taking differently: Take 100 mg by mouth 3 (three) times daily. 07/12/22  Yes Turner, Cornelious Bryant, MD  insulin degludec (TRESIBA FLEXTOUCH) 200 UNIT/ML FlexTouch Pen Inject 18 Units into the skin daily in the afternoon. 11/02/22  Yes Shamleffer, Konrad Dolores, MD  insulin lispro (HUMALOG KWIKPEN) 100 UNIT/ML KwikPen Max daily 40 units Patient taking differently: Inject 6 Units into the skin 3 (three) times daily with meals. Max daily 40 units 08/26/22  Yes Shamleffer, Konrad Dolores, MD  losartan (COZAAR) 100 MG tablet Take 100 mg by mouth daily. 08/06/22  Yes [provider]  metoCLOPramide (REGLAN) 10 MG tablet Take 1 tablet (10 mg total) by mouth every 6 (six) hours. Patient taking differently: Take 10 mg by mouth every 6 (six) hours as needed for nausea or vomiting. 12/05/21  Yes Edwin Dada P, DO  mirtazapine (REMERON) 15 MG tablet Take 1 tablet (15 mg total) by mouth at bedtime. Do not take medicine in the morning and drive. 10/05/22 10/05/23 Yes Bender, Irving Burton, DO  multivitamin (RENA-VIT) TABS tablet Take 1 tablet by mouth at bedtime. 03/08/20  Yes Almon Hercules, MD  ondansetron (ZOFRAN-ODT) 8 MG disintegrating tablet Take 1 tablet (8 mg total) by mouth every 8 (eight) hours as needed for nausea or vomiting. 07/31/21  Yes Dione Booze, MD  SENSIPAR 30 MG tablet Take 30 mg by mouth daily. 04/29/22  Yes [provider]  sucroferric oxyhydroxide (VELPHORO) 500 MG chewable tablet Chew 1,500 mg by mouth 3 (three) times daily with meals.   Yes [provider]  Continuous Blood Gluc Receiver DEVI 1 Units by Does not apply route 4 (four) times daily -  before meals and at bedtime. May substitute for cheapest monitor with insurance 03/27/21   Ilene Qua, MD  Continuous Blood Gluc Sensor (DEXCOM G7 SENSOR) MISC 1 Device by Does not apply route as directed. 10/13/21   Shamleffer, Konrad Dolores, MD  glucose blood (FREESTYLE TEST  STRIPS) test strip Use as instructed 02/25/21   Dahal, Melina Schools, MD  Insulin Pen Needle 32G X 4 MM MISC 1 Device by Does not apply route in the morning, at noon, in the evening, and at bedtime. 08/26/22   Shamleffer, Konrad Dolores, MD  losartan (COZAAR) 50 MG tablet Take 50 mg by mouth See admin instructions. Take with 25 mg for  a total of 75 mg at bedtime 08/06/22   [provider]    Social History   Socioeconomic History   Marital status: Significant Other    Spouse name: Not on file   Number of children: Not on file   Years of education: Not on file  Highest education level: Not on file  Occupational History   Not on file  Tobacco Use   Smoking status: Never    Passive exposure: Never   Smokeless tobacco: Never  Vaping Use   Vaping status: Never Used  Substance and Sexual Activity   Alcohol use: Not Currently   Drug use: Never   Sexual activity: Not Currently    Birth control/protection: None  Other Topics Concern   Not on file  Social History Narrative   Not on file   Social Determinants of Health   Financial Resource Strain: Low Risk  (07/06/2022)   Overall Financial Resource Strain (CARDIA)    Difficulty of Paying Living Expenses: Not very hard  Food Insecurity: No Food Insecurity (07/06/2022)   Hunger Vital Sign    Worried About Running Out of Food in the Last Year: Never true    Ran Out of Food in the Last Year: Never true  Transportation Needs: No Transportation Needs (07/06/2022)   PRAPARE - Administrator, Civil Service (Medical): No    Lack of Transportation (Non-Medical): No  Physical Activity: Inactive (07/06/2022)   Exercise Vital Sign    Days of Exercise per Week: 0 days    Minutes of Exercise per Session: 0 min  Stress: No Stress Concern Present (07/06/2022)   [employer] of Occupational Health - Occupational Stress Questionnaire    Feeling of Stress : Only a little  Social Connections: Socially Isolated (07/06/2022)   Social  Connection and Isolation Panel [NHANES]    Frequency of Communication with Friends and Family: Once a week    Frequency of Social Gatherings with Friends and Family: Never    Attends Religious Services: More than 4 times per year    Active Member of [employer] or Organizations: No    Attends Banker Meetings: Never    Marital Status: Never married  Intimate Partner Violence: Not At Risk (07/06/2022)   Humiliation, Afraid, Rape, and Kick questionnaire    Fear of Current or Ex-Partner: No    Emotionally Abused: No    Physically Abused: No    Sexually Abused: No    Family History  Problem Relation Age of Onset   Diabetes Mother    Aneurysm Mother 78   Seizures Mother    Diabetes Father    Cataracts Father    COPD Father    Heart attack Father    Heart disease Father    Healthy Sister    Healthy Daughter    Stroke Maternal Grandfather    Amblyopia Neg Hx    Blindness Neg Hx    Glaucoma Neg Hx    Macular degeneration Neg Hx    Retinal detachment Neg Hx    Strabismus Neg Hx    Retinitis pigmentosa Neg Hx    Colon cancer Neg Hx    Stomach cancer Neg Hx    Esophageal cancer Neg Hx    Pancreatic cancer Neg Hx    Liver disease Neg Hx     ROS: Otherwise negative unless mentioned in HPI  Physical Examination  Vitals:   11/28/22 0304 11/28/22 0924  BP: (!) 138/59 137/70  Pulse: 88 87  Resp: 20 18  Temp: (!) 97.5 F (36.4 C) 98.8 F (37.1 C)  SpO2: 100% 100%   Body mass index is 22.35 kg/m.  General: no acute distress Cardiac: hemodynamically stable, nontachycardic Pulm: normal work of breathing GI: non-tender, no pulsatile mass  Neuro: alert, no focal deficit Extremities:  Palpable thrill in right brachiobasilic fistula.  Catheter exit site on left chest hemostatic  Data:   N/A    ASSESSMENT/PLAN: This is a 32 y.o. female with ESRD who performs home dialysis via a left IJ TDC.  She has a right brachiobasilic fistula but has not been transposed.  Left IJ  TDC was incidentally dislodged yesterday and she is in need of a new dialysis catheter.  She is scheduled for brachiobasilic fistula transposition on 11/20. She will be scheduled tomorrow for [facility] placement and right brachiobasilic second stage with Dr. Karin Lieu. -Risks and benefits were reviewed and she is willing to proceed. N.p.o. midnight, consent ordered   Daria Pastures MD MS [facility] [phone] 11/28/2022  10:26 AM

## 2022-11-28 NOTE — Progress Notes (Signed)
New Admission Note:    Arrival Method: ED stretcher Mental Orientation: a/ox4 Telemetry: n/a Assessment:completed Skin: intact, guaze where catheter was IV: left arm Pain:n/a Tubes: n/a Safety Measures:non skid socks  Admission: completed Orientation: Patient has been oriented to the room, unit and staff.  Family: n/a Belongings: at bedside  Orders have been reviewed and implemented. Will continue to monitor the patient. Call light has been placed within reach and bed alarm has been activated.   Fabian Sharp BSN, RN-BC Phone number: [phone]

## 2022-11-28 NOTE — Plan of Care (Signed)

## 2022-11-28 NOTE — Plan of Care (Signed)
  Problem: Education: Goal: Knowledge of disease and its progression will improve 11/28/2022 1018 by Margarita Grizzle, RN Outcome: Progressing 11/28/2022 1018 by Margarita Grizzle, RN Outcome: Progressing Goal: Individualized Educational Video(s) 11/28/2022 1018 by Margarita Grizzle, RN Outcome: Progressing 11/28/2022 1018 by Margarita Grizzle, RN Outcome: Progressing   Problem: Fluid Volume: Goal: Compliance with measures to maintain balanced fluid volume will improve 11/28/2022 1018 by Margarita Grizzle, RN Outcome: Progressing 11/28/2022 1018 by Margarita Grizzle, RN Outcome: Progressing   Problem: Health Behavior/Discharge Planning: Goal: Ability to manage health-related needs will improve 11/28/2022 1018 by Margarita Grizzle, RN Outcome: Progressing 11/28/2022 1018 by Margarita Grizzle, RN Outcome: Progressing   Problem: Nutritional: Goal: Ability to make healthy dietary choices will improve 11/28/2022 1018 by Margarita Grizzle, RN Outcome: Progressing 11/28/2022 1018 by Margarita Grizzle, RN Outcome: Progressing   Problem: Clinical Measurements: Goal: Complications related to the disease process, condition or treatment will be avoided or minimized 11/28/2022 1018 by Margarita Grizzle, RN Outcome: Progressing 11/28/2022 1018 by Margarita Grizzle, RN Outcome: Progressing   Problem: Education: Goal: Knowledge of disease and its progression will improve Outcome: Progressing Goal: Individualized Educational Video(s) Outcome: Progressing   Problem: Fluid Volume: Goal: Compliance with measures to maintain balanced fluid volume will improve Outcome: Progressing   Problem: Health Behavior/Discharge Planning: Goal: Ability to manage health-related needs will improve Outcome: Progressing   Problem: Nutritional: Goal: Ability to make healthy dietary choices will improve Outcome: Progressing   Problem: Clinical Measurements: Goal: Complications related to the disease process, condition or treatment  will be avoided or minimized Outcome: Progressing

## 2022-11-28 NOTE — Progress Notes (Addendum)
HD#0 Subjective:   Summary: Barbara Donovan is a 32 y.o. female with pertinent PMH of ESRD on home HD, type 1 diabetes, and hypertension who presented to the emergency department after her HD catheter was pulled out accidentally and is admitted for placement of a tunneled dialysis catheter.  Overnight Events: None  Patient doing well this morning, no nausea, vomiting, abdominal pain, chest pain, shortness of breath, cough, or fevers.  She is relatively uncomplicated besides her chronic conditions which she seems to manage well.  She is admitted for tunneled dialysis catheter, after which she can go home and do home dialysis.  She will not need dialysis while inpatient.  Objective:  Vital signs in last 24 hours: Vitals:   11/27/22 2234 11/28/22 0145 11/28/22 0304 11/28/22 0924  BP:  (!) 145/70 (!) 138/59 137/70  Pulse:  88 88 87  Resp:  13 20 18   Temp:  98 F (36.7 C) (!) 97.5 F (36.4 C) 98.8 F (37.1 C)  TempSrc:  Oral Oral Oral  SpO2:  100% 100% 100%  Weight: 62.8 kg     Height: 5\' 6"  (1.676 m)      Supplemental O2: Room Air SpO2: 100 %   Physical Exam:  Constitutional: Well-appearing, in no acute distress Cardiovascular: regular rate and rhythm, no m/r/g, AV fistula of the right upper extremity with good thrill.  Minimal bleeding, no hematoma or active bleeding at the site of the now absent tunneled dialysis catheter. Pulmonary/Chest: normal work of breathing on room air, lungs clear to auscultation bilaterally Abdominal: soft, non-tender, non-distended, positive bowel sounds Psych: Pleasant affect  Filed Weights   11/27/22 2234  Weight: 62.8 kg      Intake/Output Summary (Last 24 hours) at 11/28/2022 1203 Last data filed at 11/28/2022 0900 Gross per 24 hour  Intake 120 ml  Output 0 ml  Net 120 ml   Net IO Since Admission: 120 mL [11/28/22 1203]  Pertinent Labs:    Latest Ref Rng & Units 11/28/2022   10:31 AM 11/28/2022    2:05 AM 08/24/2022    9:45 AM   CBC  WBC 4.0 - 10.5 K/uL 5.1  5.4    Hemoglobin 12.0 - 15.0 g/dL 7.2  7.0  47.8   Hematocrit 36.0 - 46.0 % 22.8  22.2  35.0   Platelets 150 - 400 K/uL 192  177         Latest Ref Rng & Units 11/28/2022    1:16 AM 09/09/2022    2:56 PM 08/24/2022    9:45 AM  CMP  Glucose 70 - 99 mg/dL 295  621  308   BUN 6 - 20 mg/dL 37  49  37   Creatinine 0.44 - 1.00 mg/dL 6.57  8.46  9.62   Sodium 135 - 145 mmol/L 132  134  140   Potassium 3.5 - 5.1 mmol/L 3.9  4.3  4.7   Chloride 98 - 111 mmol/L 90  90  98   CO2 22 - 32 mmol/L 27  25    Calcium 8.9 - 10.3 mg/dL 9.9  95.2      Assessment/Plan:   Principal Problem:   Displacement of vascular dialysis catheter (HCC) Active Problems:   Essential hypertension   ESRD on hemodialysis (HCC)   Type 1 diabetes mellitus with chronic kidney disease on chronic dialysis [facility])   #Need for Dialysis Access  #Accidental removeal of tunneled HD cath #ESRD on home intermittent HD We reached out to  vascular as well as IR to see if either service could place a tunneled dialysis catheter today.  Neither would be able to place catheter until Monday.  No need for inpatient dialysis, can go home following tunneled dialysis catheter placement and do home dialysis. - Vascular to perform tunneled dialysis catheter placement on Monday   #Hypertension BP meds held in setting of normotension on admission, blood pressures currently within normal limits.  She endorses minimal blood loss after the removal of her catheter.  She currently takes amlodipine 10 mg daily, carvedilol 25 mg twice daily, hydralazine 50 mg twice daily, and losartan 100 mg daily. - Hold BP meds for now    #Type 1 diabetes Patient notes that she currently takes 18 units of Tresiba in the afternoon and 6 units of Humalog 3 times per day before meals. - Begin SSI     #Normocytic Anemia  Noted to have a hemoglobin of 7.0. Last hemoglobin was 11.9 three months ago, but prior levels seem to have a  baseline ~7.8.  Repeat CBC with hemoglobin 7.2,, no role for transfusion at this time.  Iron panel most consistent with ESRD/anemia of chronic disease.   -Would likely benefit from ESA outpatient    Chronic conditions #History of anxiety and depression Currently takes mirtazapine 50 mg daily  Diet: Renal IVF: None,None VTE: Heparin Code: Full PT/OT recs: None, none.   Dispo: Anticipated discharge to Home in once we have tunneled dialysis catheter.   Lovie Macadamia MD Internal Medicine Resident PGY-1 Pager: [phone] Please contact the on call pager after 5 pm and on weekends at [phone].

## 2022-11-28 NOTE — H&P (Addendum)
Date: 11/28/2022               Patient Name:  Barbara Donovan MRN: [id number]  DOB: 1990/07/20 Age / Sex: 32 y.o., female   PCP: Faith Rogue, DO         Medical Service: Internal Medicine Teaching Service         Attending Physician: Dr. Reymundo Poll, MD       First Contact: Dr. Lovie Macadamia MD Pager [phone]        Second Contact: Dr. Modena Slater, DO Pager [phone]          After Hours (After 5p/  First Contact Pager: [phone]  weekends / holidays): Second Contact Pager: [phone]   SUBJECTIVE   Chief Complaint: Catheter out   History of Present Illness: Barbara Donovan is a 32 year old female with a history of ESRD, type 1 diabetes, and hypertension who presented to the ED with her catheter pulled out.  Patient notes that earlier this evening, she was completing dialysis and noted leakage at the bottom of the machine.  When this happened, she was nearing the end of her session and only had a few minutes left.  Patient asked her son to bring over paper towels, but her son could not find the paper towels.  She stood up to use her flashlight to help him see, but in the process of doing this, her catheter got caught between the cushion and the arm of her chair and fell off.  She notes that she did not feel this when it happened, but when she went to go unplug the machine, she noted the catheter had come all the way out.  She endorses a minor amount of bleeding, and the bleeding stopped quickly.  She denies any nausea, vomiting, fevers, chest pain, abdominal pain, or any other signs or symptoms.  She currently receives dialysis 4 times per week on Mondays, Wednesdays, Fridays, and Saturdays.  She denies any recent missed sessions.  She last passed a stool today that was normal in appearance.  She notes that she currently urinates 1-2 times per day, but and only produces the amount roughly equivalent to a capful.  She has been on hemodialysis since May 2022.  She notes that her ESRD is  secondary to hypertension and diabetes.  ED Course: In the ED, she received a chest x-ray, CBC with differential, renal function panel, and hCG.  Vascular surgery has been consulted for catheter placement.  Meds:  Calcitriol 0.5 mcg M, W, and F w/ HD  Hydralazine 50 mg BID  Carvedilol 25 mg BID  Sensipar 30 mg daily  Amlodipine 10 mg daily  Tresiba 18 units in afternoon  Humulog 6 units TID Velphoro 500 mg TID Losartan 100 mg daily  Mirtazpine 15 mg daily   Past Medical History ESRD secondary to hypertension and diabetes T1DM  HTN  Social:  Lives With: Son and boyfriend Occupation: None  Support: Independent of iADLs and ADLs PCP: Dr. Faith Rogue Substances: Denies tobacco use, alcohol use, or recreational drug use   Family History:  Mother and father had T2DM Denies any history of cancer  Half-sister diabetes, unclear type   Allergies: NKDA  Review of Systems: A complete ROS was negative except as per HPI.   OBJECTIVE:   Physical Exam: Blood pressure 113/71, pulse 98, temperature 98.1 F (36.7 C), resp. rate 17, height 5\' 6"  (1.676 m), weight 62.8 kg, SpO2 100%.  Constitutional: well-appearing sitting in in no acute distress  HENT: normocephalic atraumatic, mucous membranes moist Eyes: conjunctiva non-erythematous Neck: supple Cardiovascular: regular rate and rhythm, no m/r/g Pulmonary/Chest: normal work of breathing on room air, lungs clear to auscultation bilaterally Abdominal: soft, non-tender, non-distended MSK: normal bulk and tone; bandage left chest near cath site, no active bleeding or signs of infection Neurological: alert & oriented x 3, 5/5 strength in bilateral upper and lower extremities, normal gait Skin: warm and dry  Labs: Pending  Imaging: Chest X-Ray 1 View  FINDINGS: The heart size and mediastinal contours are within normal limits. No consolidation, effusion, or pneumothorax. No acute osseous abnormality.   IMPRESSION: No active  disease.  EKG: personally reviewed my interpretation is sinus rhythm.   ASSESSMENT & PLAN:   Assessment & Plan by Problem: Principal Problem:   Displacement of vascular dialysis catheter (HCC)   Barbara Donovan is a 32 y.o. person living with a history of ESRD, and type 1 diabetes, hypertension who presented with catheter out and admitted for the catheter replacement on hospital day 0  #ESRD Patient currently on home dialysis.  She receives dialysis on Mondays, Wednesdays, Thursdays, and Fridays with no missed sessions.  She currently urinates equivalent of roughly one cap day.  Vascular surgery has been consulted.  They hope to replace the catheter tomorrow, and if they are unable to complete tomorrow, they will replace this on Monday.  Also takes Sensipar 30 mg daily, Velphoro 500 mg 3 times daily, and calcitriol 0.5 mcg Mondays, Wednesdays, and Fridays - Follow-up vascular surgery for catheter replacement  #Hypertension On admission, patient is normotensive.  She endorses minimal blood loss after the removal of her catheter.  She currently takes amlodipine 10 mg daily, carvedilol 25 mg twice daily, hydralazine 50 mg twice daily, and losartan 100 mg daily. - Hold BP meds for now   #Type 1 diabetes Patient notes that she currently takes 18 units of Tresiba in the afternoon and 6 units of Humalog 3 times per day before meals. - Begin SSI    #Normocytic Anemia  Noted to have a hemoglobin of 7.0. Last hemoglobin was 11.9 three months ago, but prior levels seem to have a baseline ~7.8. Patient asymptomatic right now so holding off on transfusion but will recheck CBC later in morning. Will also check iron panel for possible iron deficiency anemia. - Follow-up CBC   Chronic conditions #History of anxiety and depression Currently takes mirtazapine 50 mg daily  Diet: Renal VTE:  Heparin IVF: None,None Code: Full  Prior to Admission Living Arrangement: Home, living son and  boyfriend Anticipated Discharge Location: Home Barriers to Discharge: Catheter replacement  Dispo: Admit patient to Observation with expected length of stay less than 2 midnights.  Signed: Morrie Sheldon, MD Internal Medicine Resident PGY-1  11/28/2022, 4:09 AM

## 2022-11-29 ENCOUNTER — Encounter (HOSPITAL_COMMUNITY)
Admission: EM | Disposition: A | Discharge status: Home / Self Care | Payer: Self-pay | Source: Home / Self Care | Attending: Internal Medicine

## 2022-11-29 ENCOUNTER — Other Ambulatory Visit: Payer: Self-pay

## 2022-11-29 ENCOUNTER — Inpatient Hospital Stay (HOSPITAL_COMMUNITY): Payer: Medicare Other | Admitting: Anesthesiology

## 2022-11-29 ENCOUNTER — Inpatient Hospital Stay (HOSPITAL_COMMUNITY): Payer: Medicare Other

## 2022-11-29 ENCOUNTER — Other Ambulatory Visit (HOSPITAL_COMMUNITY): Payer: Self-pay

## 2022-11-29 ENCOUNTER — Encounter (HOSPITAL_COMMUNITY): Payer: Self-pay | Admitting: Internal Medicine

## 2022-11-29 DIAGNOSIS — T8242XA Displacement of vascular dialysis catheter, initial encounter: Secondary | ICD-10-CM | POA: Diagnosis not present

## 2022-11-29 DIAGNOSIS — Z992 Dependence on renal dialysis: Secondary | ICD-10-CM | POA: Diagnosis not present

## 2022-11-29 DIAGNOSIS — E1022 Type 1 diabetes mellitus with diabetic chronic kidney disease: Secondary | ICD-10-CM

## 2022-11-29 DIAGNOSIS — N186 End stage renal disease: Secondary | ICD-10-CM

## 2022-11-29 DIAGNOSIS — I120 Hypertensive chronic kidney disease with stage 5 chronic kidney disease or end stage renal disease: Secondary | ICD-10-CM

## 2022-11-29 HISTORY — PX: INSERTION OF DIALYSIS CATHETER: SHX1324

## 2022-11-29 HISTORY — PX: BASCILIC VEIN TRANSPOSITION: SHX5742

## 2022-11-29 LAB — CBC
HCT: 21.7 % — ABNORMAL LOW (ref 36.0–46.0)
Hemoglobin: 6.9 g/dL — CL (ref 12.0–15.0)
MCH: 28.6 pg (ref 26.0–34.0)
MCHC: 31.8 g/dL (ref 30.0–36.0)
MCV: 90 fL (ref 80.0–100.0)
Platelets: 193 10*3/uL (ref 150–400)
RBC: 2.41 MIL/uL — ABNORMAL LOW (ref 3.87–5.11)
RDW: 19.9 % — ABNORMAL HIGH (ref 11.5–15.5)
WBC: 5.2 10*3/uL (ref 4.0–10.5)
nRBC: 0 % (ref 0.0–0.2)

## 2022-11-29 LAB — RENAL FUNCTION PANEL
Albumin: 3.3 g/dL — ABNORMAL LOW (ref 3.5–5.0)
Anion gap: 10 (ref 5–15)
BUN: 69 mg/dL — ABNORMAL HIGH (ref 6–20)
CO2: 29 mmol/L (ref 22–32)
Calcium: 8.9 mg/dL (ref 8.9–10.3)
Chloride: 94 mmol/L — ABNORMAL LOW (ref 98–111)
Creatinine, Ser: 11.98 mg/dL — ABNORMAL HIGH (ref 0.44–1.00)
GFR, Estimated: 4 mL/min — ABNORMAL LOW (ref >60)
Glucose, Bld: 110 mg/dL — ABNORMAL HIGH (ref 70–99)
Phosphorus: 7 mg/dL — ABNORMAL HIGH (ref 2.5–4.6)
Potassium: 4.6 mmol/L (ref 3.5–5.1)
Sodium: 133 mmol/L — ABNORMAL LOW (ref 135–145)

## 2022-11-29 LAB — HEMOGLOBIN AND HEMATOCRIT, BLOOD
HCT: 24.6 % — ABNORMAL LOW (ref 36.0–46.0)
Hemoglobin: 8 g/dL — ABNORMAL LOW (ref 12.0–15.0)

## 2022-11-29 LAB — PREPARE RBC (CROSSMATCH)

## 2022-11-29 LAB — GLUCOSE, CAPILLARY
Glucose-Capillary: 121 mg/dL — ABNORMAL HIGH (ref 70–99)
Glucose-Capillary: 83 mg/dL (ref 70–99)
Glucose-Capillary: 77 mg/dL (ref 70–99)
Glucose-Capillary: 75 mg/dL (ref 70–99)
Glucose-Capillary: 131 mg/dL — ABNORMAL HIGH (ref 70–99)
Glucose-Capillary: 121 mg/dL — ABNORMAL HIGH (ref 70–99)

## 2022-11-29 LAB — MRSA NEXT GEN BY PCR, NASAL: MRSA by PCR Next Gen: NOT DETECTED

## 2022-11-29 SURGERY — TRANSPOSITION, VEIN, BASILIC
Anesthesia: General | Site: Groin | Laterality: Right

## 2022-11-29 MED ORDER — LIDOCAINE 2% (20 MG/ML) 5 ML SYRINGE
INTRAMUSCULAR | Status: DC | PRN
  Administered 2022-11-29: 40 mg via INTRAVENOUS

## 2022-11-29 MED ORDER — OXYCODONE HCL 5 MG PO TABS: 5.0000 mg | ORAL_TABLET | Freq: Once | ORAL | Status: DC | PRN

## 2022-11-29 MED ORDER — OXYCODONE HCL 5 MG/5ML PO SOLN: 5.0000 mg | Freq: Once | ORAL | Status: DC | PRN

## 2022-11-29 MED ORDER — HYDROCODONE-ACETAMINOPHEN 5-325 MG PO TABS
1.0000 | ORAL_TABLET | ORAL | Status: DC | PRN
  Administered 2022-11-29 (×2): 1 via ORAL
  Filled 2022-11-29: qty 2
  Filled 2022-11-29: qty 1

## 2022-11-29 MED ORDER — PROPOFOL 10 MG/ML IV BOLUS
INTRAVENOUS | Status: DC | PRN
  Administered 2022-11-29: 180 mg via INTRAVENOUS

## 2022-11-29 MED ORDER — CHLORHEXIDINE GLUCONATE 0.12 % MT SOLN
OROMUCOSAL | Status: AC
  Administered 2022-11-29: 15 mL via OROMUCOSAL
  Filled 2022-11-29: qty 15

## 2022-11-29 MED ORDER — HYDROCODONE-ACETAMINOPHEN 5-325 MG PO TABS
1.0000 | ORAL_TABLET | Freq: Four times a day (QID) | ORAL | 0 refills | Status: DC | PRN
  Filled 2022-11-29: qty 20, 5d supply, fill #0

## 2022-11-29 MED ORDER — MIDAZOLAM HCL 2 MG/2ML IJ SOLN
INTRAMUSCULAR | Status: AC
  Filled 2022-11-29: qty 2

## 2022-11-29 MED ORDER — ACETAMINOPHEN 500 MG PO TABS
ORAL_TABLET | ORAL | Status: AC
  Administered 2022-11-29: 1000 mg via ORAL
  Filled 2022-11-29: qty 2

## 2022-11-29 MED ORDER — EPHEDRINE SULFATE-NACL 50-0.9 MG/10ML-% IV SOSY
PREFILLED_SYRINGE | INTRAVENOUS | Status: DC | PRN
  Administered 2022-11-29: 5 mg via INTRAVENOUS
  Administered 2022-11-29: 10 mg via INTRAVENOUS

## 2022-11-29 MED ORDER — THROMBI-PAD 3"X3" EX PADS: 1.0000 | MEDICATED_PAD | Freq: Once | CUTANEOUS | Status: DC

## 2022-11-29 MED ORDER — PROPOFOL 10 MG/ML IV BOLUS
INTRAVENOUS | Status: AC
  Filled 2022-11-29: qty 20

## 2022-11-29 MED ORDER — PHENYLEPHRINE HCL-NACL 20-0.9 MG/250ML-% IV SOLN
INTRAVENOUS | Status: DC | PRN
  Administered 2022-11-29: 45 ug/min via INTRAVENOUS

## 2022-11-29 MED ORDER — OXIDIZED CELLULOSE EX PADS
1.0000 | MEDICATED_PAD | Freq: Once | CUTANEOUS | Status: AC
  Administered 2022-11-29: 1 via TOPICAL
  Filled 2022-11-29: qty 1

## 2022-11-29 MED ORDER — PHENYLEPHRINE 80 MCG/ML (10ML) SYRINGE FOR IV PUSH (FOR BLOOD PRESSURE SUPPORT)
PREFILLED_SYRINGE | INTRAVENOUS | Status: DC | PRN
  Administered 2022-11-29 (×2): 160 ug via INTRAVENOUS

## 2022-11-29 MED ORDER — CHLORHEXIDINE GLUCONATE CLOTH 2 % EX PADS
6.0000 | MEDICATED_PAD | Freq: Every day | CUTANEOUS | Status: DC
  Administered 2022-11-29: 6 via TOPICAL

## 2022-11-29 MED ORDER — HEPARIN SODIUM (PORCINE) 1000 UNIT/ML IJ SOLN
INTRAMUSCULAR | Status: DC | PRN
  Administered 2022-11-29: 3000 [IU] via INTRAVENOUS

## 2022-11-29 MED ORDER — FENTANYL CITRATE (PF) 100 MCG/2ML IJ SOLN: 25.0000 ug | INTRAMUSCULAR | Status: DC | PRN

## 2022-11-29 MED ORDER — CEFAZOLIN SODIUM-DEXTROSE 2-3 GM-%(50ML) IV SOLR
INTRAVENOUS | Status: DC | PRN
  Administered 2022-11-29: 2 g via INTRAVENOUS

## 2022-11-29 MED ORDER — SODIUM CHLORIDE (PF) 0.9 % IJ SOLN
INTRAVENOUS | Status: DC | PRN
  Administered 2022-11-29: 10 mL via INTRAMUSCULAR

## 2022-11-29 MED ORDER — ONDANSETRON HCL 4 MG/2ML IJ SOLN
INTRAMUSCULAR | Status: AC
  Filled 2022-11-29: qty 2

## 2022-11-29 MED ORDER — HEPARIN 6000 UNIT IRRIGATION SOLUTION
Status: DC | PRN
  Administered 2022-11-29: 1

## 2022-11-29 MED ORDER — SODIUM CHLORIDE 0.9% IV SOLUTION: Freq: Once | INTRAVENOUS | Status: AC

## 2022-11-29 MED ORDER — FENTANYL CITRATE (PF) 250 MCG/5ML IJ SOLN
INTRAMUSCULAR | Status: AC
  Filled 2022-11-29: qty 5

## 2022-11-29 MED ORDER — SODIUM CHLORIDE 0.9 % IV SOLN: INTRAVENOUS | Status: DC | PRN

## 2022-11-29 MED ORDER — 0.9 % SODIUM CHLORIDE (POUR BTL) OPTIME
TOPICAL | Status: DC | PRN
  Administered 2022-11-29: 1000 mL

## 2022-11-29 MED ORDER — FENTANYL CITRATE (PF) 250 MCG/5ML IJ SOLN
INTRAMUSCULAR | Status: DC | PRN
  Administered 2022-11-29: 50 ug via INTRAVENOUS
  Administered 2022-11-29 (×2): 25 ug via INTRAVENOUS

## 2022-11-29 MED ORDER — CHLORHEXIDINE GLUCONATE 0.12 % MT SOLN: 15.0000 mL | Freq: Once | OROMUCOSAL | Status: AC

## 2022-11-29 MED ORDER — PHENYLEPHRINE 80 MCG/ML (10ML) SYRINGE FOR IV PUSH (FOR BLOOD PRESSURE SUPPORT)
PREFILLED_SYRINGE | INTRAVENOUS | Status: AC
  Filled 2022-11-29: qty 10

## 2022-11-29 MED ORDER — HEPARIN SODIUM (PORCINE) 1000 UNIT/ML IJ SOLN
INTRAMUSCULAR | Status: AC
  Filled 2022-11-29: qty 10

## 2022-11-29 MED ORDER — ONDANSETRON HCL 4 MG/2ML IJ SOLN
INTRAMUSCULAR | Status: DC | PRN
  Administered 2022-11-29: 4 mg via INTRAVENOUS

## 2022-11-29 MED ORDER — SODIUM CHLORIDE 0.9 % IV SOLN: 12.5000 mg | INTRAVENOUS | Status: DC | PRN

## 2022-11-29 MED ORDER — HEPARIN SODIUM (PORCINE) 1000 UNIT/ML IJ SOLN
INTRAMUSCULAR | Status: DC | PRN
  Administered 2022-11-29: 6200 [IU]

## 2022-11-29 MED ORDER — ACETAMINOPHEN 500 MG PO TABS: 1000.0000 mg | ORAL_TABLET | Freq: Once | ORAL | Status: AC

## 2022-11-29 MED ORDER — EPHEDRINE 5 MG/ML INJ
INTRAVENOUS | Status: AC
  Filled 2022-11-29: qty 5

## 2022-11-29 MED ORDER — INSULIN ASPART 100 UNIT/ML IJ SOLN
0.0000 [IU] | INTRAMUSCULAR | Status: DC | PRN
Start: 2022-11-29 — End: 2022-11-29

## 2022-11-29 MED ORDER — ORAL CARE MOUTH RINSE: 15.0000 mL | Freq: Once | OROMUCOSAL | Status: AC

## 2022-11-29 SURGICAL SUPPLY — 81 items
ADH SKN CLS APL DERMABOND .7 (GAUZE/BANDAGES/DRESSINGS) ×4
APL PRP STRL LF ISPRP CHG 10.5 (MISCELLANEOUS) ×4
APPLICATOR CHLORAPREP 10.5 ORG (MISCELLANEOUS) IMPLANT
APPLIER CLIP 11 MED OPEN (CLIP) ×2
APPLIER CLIP 9.375 MED OPEN (MISCELLANEOUS) ×2
APPLIER CLIP 9.375 SM OPEN (CLIP) ×2
APR CLP MED 11 20 MLT OPN (CLIP) ×2
APR CLP MED 9.3 20 MLT OPN (MISCELLANEOUS) ×2
APR CLP SM 9.3 20 MLT OPN (CLIP) ×2
ARMBAND PINK RESTRICT EXTREMIT (MISCELLANEOUS) ×2 IMPLANT
BAG COUNTER SPONGE SURGICOUNT (BAG) ×2 IMPLANT
BAG DECANTER FOR FLEXI CONT (MISCELLANEOUS) ×2 IMPLANT
BAG SPNG CNTER NS LX DISP (BAG) ×2
BIOPATCH RED 1 DISK 7.0 (GAUZE/BANDAGES/DRESSINGS) ×2 IMPLANT
BNDG CMPR 5X4 KNIT ELC UNQ LF (GAUZE/BANDAGES/DRESSINGS) ×2
BNDG CMPR 6 X 5 YARDS HK CLSR (GAUZE/BANDAGES/DRESSINGS) ×2
BNDG ELASTIC 4INX 5YD STR LF (GAUZE/BANDAGES/DRESSINGS) IMPLANT
BNDG ELASTIC 4X5.8 VLCR STR LF (GAUZE/BANDAGES/DRESSINGS) ×2 IMPLANT
BNDG ELASTIC 6INX 5YD STR LF (GAUZE/BANDAGES/DRESSINGS) IMPLANT
CANISTER SUCT 3000ML PPV (MISCELLANEOUS) ×2 IMPLANT
CATH PALINDROME-P 19CM W/VT (CATHETERS) IMPLANT
CATH PALINDROME-P 23CM W/VT (CATHETERS) IMPLANT
CATH PALINDROME-P 28CM W/VT (CATHETERS) IMPLANT
CATH STRAIGHT 5FR 65CM (CATHETERS) IMPLANT
CLIP APPLIE 11 MED OPEN (CLIP) IMPLANT
CLIP APPLIE 9.375 MED OPEN (MISCELLANEOUS) ×2 IMPLANT
CLIP APPLIE 9.375 SM OPEN (CLIP) ×2 IMPLANT
CLIP TI MEDIUM 24 (CLIP) ×2 IMPLANT
CLIP TI WIDE RED SMALL 24 (CLIP) ×2 IMPLANT
COVER PROBE W GEL 5X96 (DRAPES) ×2 IMPLANT
COVER SURGICAL LIGHT HANDLE (MISCELLANEOUS) ×2 IMPLANT
DERMABOND ADVANCED .7 DNX12 (GAUZE/BANDAGES/DRESSINGS) ×2 IMPLANT
DRAPE C-ARM 42X72 X-RAY (DRAPES) ×2 IMPLANT
DRAPE CHEST BREAST 15X10 FENES (DRAPES) ×2 IMPLANT
DRAPE SURG ORHT 6 SPLT 77X108 (DRAPES) IMPLANT
ELECT REM PT RETURN 9FT ADLT (ELECTROSURGICAL) ×2
ELECTRODE REM PT RTRN 9FT ADLT (ELECTROSURGICAL) ×2 IMPLANT
GAUZE 4X4 16PLY ~~LOC~~+RFID DBL (SPONGE) ×2 IMPLANT
GLIDEWIRE ADV .035X180CM (WIRE) IMPLANT
GLOVE BIOGEL PI IND STRL 8 (GLOVE) ×2 IMPLANT
GOWN STRL REUS W/ TWL LRG LVL3 (GOWN DISPOSABLE) ×4 IMPLANT
GOWN STRL REUS W/TWL 2XL LVL3 (GOWN DISPOSABLE) ×4 IMPLANT
GOWN STRL REUS W/TWL LRG LVL3 (GOWN DISPOSABLE) ×4
KIT BASIN OR (CUSTOM PROCEDURE TRAY) ×2 IMPLANT
KIT PALINDROME-P 55CM (CATHETERS) IMPLANT
KIT TURNOVER KIT B (KITS) ×2 IMPLANT
NDL 18GX1X1/2 (RX/OR ONLY) (NEEDLE) ×2 IMPLANT
NDL HYPO 25GX1X1/2 BEV (NEEDLE) ×2 IMPLANT
NEEDLE 18GX1X1/2 (RX/OR ONLY) (NEEDLE) ×2 IMPLANT
NEEDLE HYPO 25GX1X1/2 BEV (NEEDLE) ×2 IMPLANT
NS IRRIG 1000ML POUR BTL (IV SOLUTION) ×2 IMPLANT
PACK BASIC III (CUSTOM PROCEDURE TRAY) ×2
PACK CV ACCESS (CUSTOM PROCEDURE TRAY) ×2 IMPLANT
PACK SRG BSC III STRL LF ECLPS (CUSTOM PROCEDURE TRAY) ×2 IMPLANT
PAD ARMBOARD 7.5X6 YLW CONV (MISCELLANEOUS) ×4 IMPLANT
SET MICROPUNCTURE 5F STIFF (MISCELLANEOUS) IMPLANT
SLING ARM FOAM STRAP LRG (SOFTGOODS) IMPLANT
SLING ARM FOAM STRAP MED (SOFTGOODS) IMPLANT
SOAP 2 % CHG 4 OZ (WOUND CARE) ×2 IMPLANT
SPIKE FLUID TRANSFER (MISCELLANEOUS) ×2 IMPLANT
SUT ETHILON 3 0 PS 1 (SUTURE) ×2 IMPLANT
SUT MNCRL AB 4-0 PS2 18 (SUTURE) ×2 IMPLANT
SUT PROLENE 6 0 BV (SUTURE) ×2 IMPLANT
SUT PROLENE 7 0 BV 1 (SUTURE) IMPLANT
SUT SILK 2 0 SH (SUTURE) IMPLANT
SUT SILK 2 0 SH CR/8 (SUTURE) ×2 IMPLANT
SUT VIC AB 2-0 CT1 27 (SUTURE) ×2
SUT VIC AB 2-0 CT1 TAPERPNT 27 (SUTURE) ×2 IMPLANT
SUT VIC AB 3-0 SH 27 (SUTURE) ×4
SUT VIC AB 3-0 SH 27X BRD (SUTURE) ×4 IMPLANT
SYR 10ML LL (SYRINGE) ×2 IMPLANT
SYR 20ML LL LF (SYRINGE) ×4 IMPLANT
SYR 5ML LL (SYRINGE) ×2 IMPLANT
SYR CONTROL 10ML LL (SYRINGE) ×2 IMPLANT
TOWEL GREEN STERILE (TOWEL DISPOSABLE) ×2 IMPLANT
TOWEL GREEN STERILE FF (TOWEL DISPOSABLE) ×2 IMPLANT
UNDERPAD 30X36 HEAVY ABSORB (UNDERPADS AND DIAPERS) ×2 IMPLANT
WATER STERILE IRR 1000ML POUR (IV SOLUTION) ×2 IMPLANT
WIRE AMPLATZ SS-J .035X180CM (WIRE) IMPLANT
WIRE BENTSON .035X145CM (WIRE) IMPLANT
WIRE TORQFLEX AUST .018X40CM (WIRE) IMPLANT

## 2022-11-29 NOTE — Progress Notes (Signed)
     I have spoken with and evaluated the patient this morning.  She is currently scheduled for [facility] placement and right upper arm fistula creation with Dr. Karin Lieu today.  She has been n.p.o. since midnight.  Consent orders were placed.  She is still agreeable to surgery and all questions have been answered.  Will proceed with surgery today.  Loel Dubonnet PA-C [facility] 8:31AM 11/29/2022

## 2022-11-29 NOTE — Discharge Summary (Addendum)
Name: Barbara Donovan MRN: [id number] DOB: 11-Mar-1990 32 y.o. PCP: Faith Rogue, DO  Date of Admission: 11/27/2022 10:05 PM Date of Discharge:  11/29/2022 Attending Physician: Dr.  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   amLODipine 10 MG tablet Commonly known as: NORVASC Take 1 tablet (10 mg total) by mouth daily. What changed: when to take this   calcitRIOL 0.5 MCG capsule Commonly known as: ROCALTROL Take 0.5 mcg by mouth every Monday, Wednesday, and Friday with hemodialysis.   carvedilol 25 MG tablet Commonly known as: COREG Take 1 tablet (25 mg total) by mouth 2 (two) times daily.   Continuous Blood Gluc Receiver Devi 1 Units by Does not apply route 4 (four) times daily -  before meals and at bedtime. May substitute for cheapest monitor with insurance   Dexcom G7 Sensor Misc 1 Device by Does not apply route as directed.   ethyl chloride spray Apply 1 Application topically every Monday, Wednesday, and Friday with hemodialysis. Apply to arm   FREESTYLE TEST STRIPS test strip Generic drug: glucose blood Use as instructed   hydrALAZINE 50 MG tablet Commonly known as: APRESOLINE TAKE 1 TAB TWICE A DAY, MAY TAKE 1 EXTRA TAB DAILY ONLY AS NEEDED FOR ELEVATED BLOOD PRESSURE What changed:  how much to take how to take this when to take this additional instructions   HYDROcodone-acetaminophen 5-325 MG  tablet Commonly known as: NORCO/VICODIN Take 1 tablet by mouth every 6 (six) hours as needed for moderate pain (pain score 4-6).   insulin lispro 100 UNIT/ML KwikPen Commonly known as: HumaLOG KwikPen Max daily 40 units What changed:  how much to take how to take this when to take this   Insulin Pen Needle 32G X 4 MM Misc 1 Device by Does not apply route in the morning, at noon, in the evening, and at bedtime.   losartan 100 MG tablet Commonly known as: COZAAR Take 100 mg by mouth at bedtime.   metoCLOPramide 10 MG tablet Commonly known as: REGLAN Take 1 tablet (10 mg total) by mouth every 6 (six) hours. What changed:  when to take this reasons to take this   mirtazapine 15 MG tablet Commonly known as: REMERON Take 1 tablet (15 mg total) by mouth at bedtime. Do not take medicine in the morning and drive.   multivitamin Tabs tablet Take 1 tablet by mouth at bedtime.   ondansetron 8 MG disintegrating tablet Commonly known as: ZOFRAN-ODT Take 1 tablet (8 mg total) by mouth every 8 (eight) hours as needed for nausea or vomiting.   Sensipar 30 MG tablet Generic drug: cinacalcet Take 30 mg by mouth daily.   Evaristo Bury FlexTouch 200 UNIT/ML FlexTouch Pen Generic drug: insulin degludec Inject 18 Units into the skin daily in the afternoon.   Velphoro 500 MG chewable tablet Generic drug: sucroferric oxyhydroxide Chew 1,500 mg by mouth 3 (three) times daily with meals.  DISPOSITION AND FOLLOW-UP:  [name] was discharged from [facility] in Stable condition. At the hospital follow up visit please address:  ESRD: Continue to make sure patient is adherent to dialysis at home. Make sure that patient does not become uremic.  Normocytic anemia: Patient had normocytic anemia which should be followed up. Iron studies consistent with anemia of chronic kidney diease. Could benefit from ESA outpatient.   Follow-up Recommendations: Labs: CBC,  RFP Studies: None  Medications: None  Follow-up Appointments: 12/21/2022 3:45 PM      Katheran James, DO [facility]  Follow-up Information     Katheran James, [place]. Go on 12/21/2022.   Specialty: Internal Medicine Why: Please go to your appointment at 3:45pm. Contact information: [address] [place] [address] [phone]         [facility] Follow up in 2 week(s).   Specialty: Vascular Surgery Contact information: [address] [place] [address] [phone]                HOSPITAL COURSE:  Patient Summary: Barbara Donovan is a 32 y.o. female with pertinent PMH of ESRD on home HD, type 1 diabetes, and hypertension who presented to the emergency department after her HD catheter was pulled out accidentally and is admitted for placement of a tunneled dialysis catheter.  Please see problem based assessment and plan for more information regarding her hospital stay.  #Need for Dialysis Access  #Accidental removeal of tunneled HD cath #ESRD on home intermittent HD Vascular surgery/IR unable to place tunneled dialysis catheter on Sunday, 11/28/2022.  Vascular surgery replace catheter on 11/29/2022 without complication as well as performed right AV fistula transposition  .  Per nephrology, no need for inpatient dialysis, can go home following tunneled dialysis catheter placement and do home dialysis. Patient remained stable after procedure and was medically ready to DC home.    #Hypertension Currently takes amlodipine 10 mg daily, carvedilol 25 mg twice daily, hydralazine 50 mg twice daily, and losartan 100 mg daily. Resumed home medications at discharge.    #Type 1 diabetes Patient notes that she currently takes 18 units of Tresiba in the afternoon and 6 units of Humalog 3 times per day before meals. Resumed home medications on discharge.     #Normocytic Anemia  Noted to have a hemoglobin of 7.0 on admission. Hemoglobin down to 6.9 and received one unit of blood. Post transfusion hemoglobin 8.0. This admission Iron panel most consistent with ESRD/anemia of chronic disease.   - May benefit from ESA outpatient   DISCHARGE INSTRUCTIONS:   Discharge Instructions     Call MD for:  persistant dizziness or light-headedness   Complete by: As directed    Call MD for:  severe uncontrolled pain   Complete by: As directed    Call MD for:  temperature >100.4   Complete by: As directed    Diet - low sodium heart healthy   Complete by: As directed    Discharge instructions   Complete by: As directed    You were hospitalized for replacement of your tunneled dialysis catheter. Thank you for allowing [name] to be part of your care.   We arranged for you to follow up at:  12/21/2022 3:45 PM  Katheran James, DO  [facility] Of [place]  We  are not making any changes to medications. Please follow up with [name] in clinic, and continue to see your other doctors.   Please call our clinic if you have any questions or concerns, we may be able to help and keep you from a long and expensive emergency room wait. Our clinic and after hours phone number is [phone], the best time to call is Monday through Friday 9 am to 4 pm but there is always someone available 24/7 if you have an emergency. If you need medication refills please notify your pharmacy one week in advance and they will send [name] a request.   Increase activity slowly   Complete by: As directed        SUBJECTIVE:   Patient reports that she is doing well. She has no concerns this morning. She denies any bleeding. She denies any pain or shortness of breath. She states that she is ready for her procedure.   Discharge Vitals:   BP 119/69 (BP Location: Left Arm)   Pulse 67   Temp 98.1 F (36.7 C) (Oral)   Resp 16   Ht 5\' 6"  (1.676 m)    Wt 62.8 kg   LMP  (Approximate) Comment: last menstrual period - three years ago per patient  SpO2 100%   BMI 22.35 kg/m   OBJECTIVE:  Physical Exam  General: Patient is resting comfortably in bed in no acute distress  Head: Normocephalic, atraumatic  Cardio: Regular rate and rhythm, no murmurs, rubs or gallops. 2+ pulses to bilateral upper and lower extremities  Pulmonary: Clear to ausculation bilaterally with no rales, rhonchi, and crackles  Skin: HD catheter site with no concerns and no further bleeding   Pertinent Labs, Studies, and Procedures:     Latest Ref Rng & Units 11/29/2022    3:42 PM 11/29/2022    4:24 AM 11/28/2022   10:31 AM  CBC  WBC 4.0 - 10.5 K/uL  5.2  5.1   Hemoglobin 12.0 - 15.0 g/dL 8.0  6.9  7.2   Hematocrit 36.0 - 46.0 % 24.6  21.7  22.8   Platelets 150 - 400 K/uL  193  192        Latest Ref Rng & Units 11/29/2022    4:24 AM 11/28/2022    1:16 AM 09/09/2022    2:56 PM  CMP  Glucose 70 - 99 mg/dL 034  742  595   BUN 6 - 20 mg/dL 69  37  49   Creatinine 0.44 - 1.00 mg/dL 63.87  5.64  3.32   Sodium 135 - 145 mmol/L 133  132  134   Potassium 3.5 - 5.1 mmol/L 4.6  3.9  4.3   Chloride 98 - 111 mmol/L 94  90  90   CO2 22 - 32 mmol/L 29  27  25    Calcium 8.9 - 10.3 mg/dL 8.9  9.9  95.1     DG Chest Portable 1 View  Result Date: 11/28/2022 CLINICAL DATA:  Dialysis catheter problem. EXAM: PORTABLE CHEST 1 VIEW COMPARISON:  06/28/2022. FINDINGS: The heart size and mediastinal contours are within normal limits. No consolidation, effusion, or pneumothorax. No acute osseous abnormality. IMPRESSION: No active disease. Electronically Signed   By: Thornell Sartorius M.D.   On: 11/28/2022 00:48     Signed: Lovie Macadamia MD Internal Medicine Resident, PGY-1 [facility]  Pager: [phone] 4:56 PM, 11/29/2022   Modena Slater, DO Internal Medicine Resident PGY-1 [phone]

## 2022-11-29 NOTE — Progress Notes (Signed)
CCC Pre-op Review  Pre-op checklist:  at time of note; INCOMPLETE  NPO:  since MN  Labs:   **11/1 @0424   H/H 6.9/ 21.7  ( T&S  done)                 bHCG  no result                  NO BMP / CMP noted as ordered  Consent:  orders written, mo documentation of completeness...... have msg'd RN   H&P:   needs  interval note  Vitals:   @0812     98.3 p75  r 18  124/71  98% RA  O2 requirements:    RA  MAR/PTA review:       Heparin SQ last dose 11/10  @2157   * Coreg 25 mg po this am      Novolog 2u SQ @ 0817   IV:    20g L UA  Floor nurse name:   Margarita Grizzle RN  Additional info:    DM 2    ESRD  HTN

## 2022-11-29 NOTE — Progress Notes (Signed)
  Daily Progress Note  S/p:-  Subjective: No complaints  Objective: Vitals:   11/29/22 1006 11/29/22 1031  BP: (!) 143/73 (!) 150/62  Pulse: 74 75  Resp: 18 18  Temp: 98.3 F (36.8 C) 98.4 F (36.9 C)  SpO2:  99%    Physical Examination Nonlabored breathing Normal rate Palpable thrill in the right arm AV fistula   ASSESSMENT/PLAN:  Pt is a 32 year old female with end-stage renal disease requiring dialysis.  She is scheduled for tunneled dialysis catheter placement, superficialization/transposition of her right brachiobasilic fistula.  After discussing the risks and benefits of both, Barbara Donovan elected to proceed.   Victorino Sparrow MD MS [facility] [phone] 11/29/2022  11:11 AM

## 2022-11-29 NOTE — Discharge Instructions (Addendum)
You were hospitalized for replacement of your tunneled dialysis catheter. Thank you for allowing [name] to be part of your care.   We arranged for you to follow up at:  12/21/2022 3:45 PM  Katheran James, DO   [facility] [place]  We are not making any changes to medications. Please follow up with [name] in clinic, and continue to see your other doctors.   Please call our clinic if you have any questions or concerns, we may be able to help and keep you from a long and expensive emergency room wait. Our clinic and after hours phone number is [phone], the best time to call is Monday through Friday 9 am to 4 pm but there is always someone available 24/7 if you have an emergency. If you need medication refills please notify your pharmacy one week in advance and they will send [name] a request.       [facility]  Discharge Instructions  AV Fistula or Graft Surgery for Dialysis Access  Please refer to the following instructions for your post-procedure care. Your surgeon or physician assistant will discuss any changes with you.  Activity  You may drive the day following your surgery, if you are comfortable and no longer taking prescription pain medication. Resume full activity as the soreness in your incision resolves.  Bathing/Showering  You may shower after you go home. Keep your incision dry for 48 hours. Do not soak in a bathtub, hot tub, or swim until the incision heals completely. You may not shower if you have a hemodialysis catheter.  Incision Care  Clean your incision with mild soap and water after 48 hours. Pat the area dry with a clean towel. You do not need a bandage unless otherwise instructed. Do not apply any ointments or creams to your incision. You may have skin glue on your incision. Do not peel it off. It will come off on its own in about one week. Your arm may swell a bit after surgery. To  reduce swelling use pillows to elevate your arm so it is above your heart. Your doctor will tell you if you need to lightly wrap your arm with an ACE bandage.  Diet  Resume your normal diet. There are not special food restrictions following this procedure. In order to heal from your surgery, it is CRITICAL to get adequate nutrition. Your body requires vitamins, minerals, and protein. Vegetables are the best source of vitamins and minerals. Vegetables also provide the perfect balance of protein. Processed food has little nutritional value, so try to avoid this.  Medications  Resume taking all of your medications. If your incision is causing pain, you may take over-the counter pain relievers such as acetaminophen (Tylenol). If you were prescribed a stronger pain medication, please be aware these medications can cause nausea and constipation. Prevent nausea by taking the medication with a snack or meal. Avoid constipation by drinking plenty of fluids and eating foods with high amount of fiber, such as fruits, vegetables, and grains. Do not take Tylenol if you are taking prescription pain medications.     Follow up Your surgeon may want to see you in the office following your access surgery. If so, this will be arranged at the time of your surgery.  Please call us immediately for any of the following conditions:  Increased pain, redness, drainage (pus) from your incision site Fever of 101 degrees or higher  Severe or worsening pain at your incision site Hand pain or numbness.  Reduce your risk of vascular disease:  Stop smoking. If you would like help, call [facility] at 1-800-QUIT-NOW ((229) 420-3645) or [place] at (417)266-7676  Manage your cholesterol Maintain a desired weight Control your diabetes Keep your blood pressure down  Dialysis  It will take several weeks to several months for your new dialysis access to be ready for use. Your surgeon will determine when it is OK to use  it. Your nephrologist will continue to direct your dialysis. You can continue to use your Permcath until your new access is ready for use.  If you have any questions, please call the office at [phone].

## 2022-11-29 NOTE — TOC Transition Note (Signed)
Transition of Care [facility]) - CM/SW Discharge Note   Patient Details  Name: Barbara Donovan MRN: [id number] Date of Birth: June 20, 1990  Transition of Care [facility]) CM/SW Contact:  Tom-Johnson, Hershal Coria, RN Phone Number: 11/29/2022, 5:09 PM   Clinical Narrative:     Patient is scheduled for discharge today.  Readmission Risk Assessment done. Hospital f/u and discharge instructions on AVS. Prescriptions sent to [facility] pharmacy and meds will be delivered to patient at bedside prior discharge. No TOC needs or recommendations noted. Significant Other, Reginald to transport at discharge.  No further TOC needs noted.         Final next level of care: Home/Self Care Barriers to Discharge: Barriers Resolved   Patient Goals and CMS Choice CMS Medicare.gov Compare Post Acute Care list provided to:: Patient Choice offered to / list presented to : NA  Discharge Placement                  Patient to be transferred to facility by: Significant Other Name of family member notified: [facility]    Discharge Plan and Services Additional resources added to the After Visit Summary for                  DME Arranged: N/A DME Agency: NA       HH Arranged: NA HH Agency: NA        Social Determinants of Health (SDOH) Interventions SDOH Screenings   Food Insecurity: No Food Insecurity (07/06/2022)  Housing: Low Risk  (07/06/2022)  Transportation Needs: No Transportation Needs (07/06/2022)  Utilities: Not At Risk (07/06/2022)  Alcohol Screen: Low Risk  (07/06/2022)  Depression (PHQ2-9): Medium Risk (09/09/2022)  Financial Resource Strain: Low Risk  (07/06/2022)  Physical Activity: Inactive (07/06/2022)  Social Connections: Socially Isolated (07/06/2022)  Stress: No Stress Concern Present (07/06/2022)  Tobacco Use: Low Risk  (11/29/2022)     Readmission Risk Interventions    11/29/2022    5:07 PM 06/29/2022    4:32 PM 06/09/2020    1:04 PM  Readmission Risk Prevention Plan   Transportation Screening Complete Complete Complete  PCP or Specialist Appt within 5-7 Days Complete Complete   Home Care Screening Complete Complete   Medication Review (RN CM) Referral to Pharmacy Referral to Pharmacy   PCP or Specialist appointment within 3-5 days of discharge   Complete  HRI or Home Care Consult   Complete  SW Recovery Care/Counseling Consult   Complete  Palliative Care Screening   Not Applicable  Skilled Nursing Facility   Not Applicable

## 2022-11-29 NOTE — Transfer of Care (Signed)
Immediate Anesthesia Transfer of Care Note  Patient: TASHAUNA RUMAN  Procedure(s) Performed: RIGHT ARM BASILIC VEIN TRANSPOSITION (Right: Arm Upper) INSERTION OF TUNNELED DIALYSIS CATHETER TO LEFT FEMORAL VEIN (Left: Groin)  Patient Location: PACU  Anesthesia Type:General  Level of Consciousness: sedated and responds to stimulation  Airway & Oxygen Therapy: Patient Spontanous Breathing and Patient connected to nasal cannula oxygen  Post-op Assessment: Report given to RN and Post -op Vital signs reviewed and stable  Post vital signs: Reviewed and stable  Last Vitals:  Vitals Value Taken Time  BP 144/52 11/29/22 1441  Temp    Pulse 61 11/29/22 1444  Resp 8 11/29/22 1444  SpO2 100 % 11/29/22 1444  Vitals shown include unfiled device data.  Last Pain:  Vitals:   11/29/22 1031  TempSrc: Oral  PainSc: 0-No pain      Patients Stated Pain Goal: 0 (11/28/22 0145)  Complications: No notable events documented.

## 2022-11-29 NOTE — Progress Notes (Signed)
IV team consulted to change patient's new femoral HD catheter dressing prior to discharge due to oozing post procedure. Upon removing dressing, antimicrobial disc and gauze saturated with blood. Pressure held as site continued to ooze blood. Primary RN notified that site was still oozing and MD should be notified. MD arrived to bedside to assess site. MD directed RN to hold pressure and then place pressure dressing. MD spoke to patient about continuing with the discharge plan.  Site continued to ooze after manual pressure held. IV team directed unit RN to order thrombi pad to be applied and to place IV team consult once arrives to unit.  Pressure dressing applied until thrombi pad available.

## 2022-11-29 NOTE — Anesthesia Preprocedure Evaluation (Addendum)
Anesthesia Evaluation  Patient identified by MRN, date of birth, ID band Patient awake    Reviewed: Allergy & Precautions, NPO status , Patient's Chart, lab work & pertinent test results, reviewed documented beta blocker date and time   History of Anesthesia Complications Negative for: history of anesthetic complications  Airway Mallampati: III  TM Distance: >3 FB Neck ROM: Full    Dental  (+) Dental Advisory Given, Teeth Intact   Pulmonary asthma    Pulmonary exam normal        Cardiovascular hypertension, Pt. on medications and Pt. on home beta blockers Normal cardiovascular exam     Neuro/Psych  PSYCHIATRIC DISORDERS Anxiety Depression    negative neurological ROS     GI/Hepatic negative GI ROS, Neg liver ROS,,,  Endo/Other  diabetes, Type 1, Insulin Dependent    Renal/GU ESRF and DialysisRenal disease     Musculoskeletal negative musculoskeletal ROS (+)    Abdominal   Peds  Hematology  (+) Blood dyscrasia, anemia Lab Results      Component                Value               Date                      WBC                      5.2                 11/29/2022                HGB                      6.9 (LL)            11/29/2022                HCT                      21.7 (L)            11/29/2022                MCV                      90.0                11/29/2022                PLT                      193                 11/29/2022              - 1U PRBC pre-op   Anesthesia Other Findings   Reproductive/Obstetrics                             Anesthesia Physical Anesthesia Plan  ASA: 3  Anesthesia Plan: General   Post-op Pain Management: Tylenol PO (pre-op)*   Induction: Intravenous  PONV Risk Score and Plan: 3 and Treatment may vary due to age or medical condition, Ondansetron and Midazolam  Airway Management Planned: LMA and Mask  Additional Equipment: None  Intra-op  Plan:   Post-operative Plan: Extubation in OR  Informed Consent: I have reviewed the patients History and Physical, chart, labs and discussed the procedure including the risks, benefits and alternatives for the proposed anesthesia with the patient or authorized representative who has indicated his/her understanding and acceptance.     Dental advisory given  Plan Discussed with: CRNA and Anesthesiologist  Anesthesia Plan Comments:        Anesthesia Quick Evaluation

## 2022-11-29 NOTE — Op Note (Signed)
NAME: Barbara Donovan    MRN: [id number] DOB: [birth year]    DATE OF OPERATION: 11/29/2022  PREOP DIAGNOSIS:    End-stage renal disease POSTOP DIAGNOSIS:    Same  PROCEDURE:    Right arm brachiobasilic fistula transposition Left common femoral vein tunneled dialysis catheter placement 55 cm palindrome  SURGEON: Victorino Sparrow  ASSIST: Emilie Rutter, PA  ANESTHESIA: General  EBL: 75 mL  INDICATIONS:    Barbara Donovan is a 32 y.o. female with end-stage renal disease in need of long-term HD access.  Prior access in bilateral internal jugular veins.  Patient with previously completed for stage brachiobasilic fistula with excellent thrill.  After discussing the risks and benefits of tunneled dialysis catheter placement, second stage brachiobasilic fistula transposition, Jusco elected to proceed.  FINDINGS:   Right internal jugular vein occluded Left internal jugular vein feeding the azygous system. Widely patent left common femoral vein. Brachiobasilic fistula 7 mm in diameter, excellent thrill  TECHNIQUE:   Patient is brought to the OR laid in supine position.  General anesthesia was induced and patient from [facility] standard fashion.  The case began with ultrasound insonation of the left internal jugular vein.  This was identified and accessed with an ultrasound-guided micropuncture needle.  I had difficulty threading the wire the normal anatomic path of the brachiocephalic vein.  I used contrast demonstrating filling of the azygous system.  The brachiocephalic vein was not appreciated.  Next, I accessed the right neck via the internal jugular vein.  The internal jugular vein was occluded.  I elected to move to the left groin.  This required reprepping, therefore I moved forward with right sided brachiobasilic fistula transposition.   The case began with right arm ultrasound fistula mapping.  Multiple branches were noted and marked.  Two longitudinal skip incisions were made  along the course of the basilic vein with 5 cm bridge.  This was carried through the subcutaneous fat to the brachiobasilic fistula.  The fistula was mobilized and multiple branches ligated using 2-0 silk and clips.  Once mobilized, a gore tunneler was brought on to the field, and a subcutaneous tunnel was made along the medial aspect of the bicep. Next, the patient as heparinized, fistula marked, clamped and transected. The vein was pulled through the tunnel tract and reanastomosed using 6.0 prolene suture in running fashion. The wound bed was irrigated with saline, hemostasis achieved with suture and cautery. The wounds were closed with layers of vicryl suture with monocryl and dermabond at the skin. There was a palpable thrill in the fistula at case completion with excellent pulse in the wrist.    Next, the patient was reprepped. Using ultrasound guidance the left common femoral vein was accessed with micropuncture technique.  Through the micropuncture sheath a floppy J-wire was advanced into the superior vena cava.  A small incision was made around the skin access point.  A counterincision was made in the chest under the clavicle.  A 55 cm tunneled dialysis catheter was then tunneled under the skin, over the clavicle into the incision in the neck.  The access point was serially dilated under direct fluoroscopic guidance.  A peel-away sheath was introduced into the superior vena cava under fluoroscopic guidance.  The tunneling device was removed and the catheter fed through the peel-away sheath into the superior vena cava.  The peel-away sheath was removed and the catheter gently pulled back.  Adequate position was confirmed with x-ray.  The catheter was tested  and found to flush and draw back well.  Catheter was heparin locked.  Caps were applied.  Catheter was sutured to the skin.  The left groin incision was closed with 4-0 Monocryl.  Given the complexity of the case,  the assistant was necessary in order  to expedient the procedure and safely perform the technical aspects of the operation.  The assistant provided traction and countertraction to assist with exposure of the artery and vein.  They also assisted with suture ligation of multiple venous branches.  They played a critical role in the anastomosis. These skills, especially following the Prolene suture for the anastomosis, could not have been adequately performed by a scrub tech assistant.    Victorino Sparrow, MD [facility] DATE OF DICTATION:   11/29/2022

## 2022-11-29 NOTE — Plan of Care (Signed)
  Problem: Education: Goal: Knowledge of disease and its progression will improve Outcome: Progressing Goal: Individualized Educational Video(s) Outcome: Progressing   Problem: Fluid Volume: Goal: Compliance with measures to maintain balanced fluid volume will improve Outcome: Progressing   Problem: Health Behavior/Discharge Planning: Goal: Ability to manage health-related needs will improve Outcome: Progressing   Problem: Nutritional: Goal: Ability to make healthy dietary choices will improve Outcome: Progressing   Problem: Clinical Measurements: Goal: Complications related to the disease process, condition or treatment will be avoided or minimized Outcome: Progressing   Problem: Education: Goal: Knowledge of disease and its progression will improve Outcome: Progressing Goal: Individualized Educational Video(s) Outcome: Progressing   Problem: Fluid Volume: Goal: Compliance with measures to maintain balanced fluid volume will improve Outcome: Progressing   Problem: Health Behavior/Discharge Planning: Goal: Ability to manage health-related needs will improve Outcome: Progressing   Problem: Nutritional: Goal: Ability to make healthy dietary choices will improve Outcome: Progressing   Problem: Clinical Measurements: Goal: Complications related to the disease process, condition or treatment will be avoided or minimized Outcome: Progressing   Problem: Education: Goal: Knowledge of General Education information will improve Description: Including pain rating scale, medication(s)/side effects and non-pharmacologic comfort measures Outcome: Progressing   Problem: Health Behavior/Discharge Planning: Goal: Ability to manage health-related needs will improve Outcome: Progressing   Problem: Clinical Measurements: Goal: Ability to maintain clinical measurements within normal limits will improve Outcome: Progressing Goal: Will remain free from infection Outcome:  Progressing Goal: Diagnostic test results will improve Outcome: Progressing Goal: Respiratory complications will improve Outcome: Progressing Goal: Cardiovascular complication will be avoided Outcome: Progressing   Problem: Activity: Goal: Risk for activity intolerance will decrease Outcome: Progressing   Problem: Nutrition: Goal: Adequate nutrition will be maintained Outcome: Progressing   Problem: Coping: Goal: Level of anxiety will decrease Outcome: Progressing   Problem: Elimination: Goal: Will not experience complications related to bowel motility Outcome: Progressing Goal: Will not experience complications related to urinary retention Outcome: Progressing   Problem: Pain Management: Goal: General experience of comfort will improve Outcome: Progressing   Problem: Safety: Goal: Ability to remain free from injury will improve Outcome: Progressing   Problem: Skin Integrity: Goal: Risk for impaired skin integrity will decrease Outcome: Progressing

## 2022-11-29 NOTE — Anesthesia Procedure Notes (Signed)
Procedure Name: LMA Insertion Date/Time: 11/29/2022 12:02 PM  Performed by: Eulah Pont, CRNAPre-anesthesia Checklist: Patient identified, Emergency Drugs available, Suction available and Patient being monitored Patient Re-evaluated:Patient Re-evaluated prior to induction Oxygen Delivery Method: Circle System Utilized Preoxygenation: Pre-oxygenation with 100% oxygen Induction Type: IV induction LMA: LMA inserted LMA Size: 4.0 Number of attempts: 1 Placement Confirmation: positive ETCO2 Tube secured with: Tape Dental Injury: Teeth and Oropharynx as per pre-operative assessment

## 2022-11-29 NOTE — Progress Notes (Addendum)
Patient alert and oriented x 4 came in short stay for surgery with Dr. Karin Lieu. Patient is 32 years old and no pregnancy test was done. Patient verbalized that last time was she voided was Saturday and she is not able to give a sample of urine at this time. Also, patient verbalized that her last menstrual period was three years ago. Patient verbalized that she is not pregnant. Dr. Mal Amabile made aware; no hCG qualitative test needed at this time per Dr. Mal Amabile if patient verbalized that she is not pregnant.

## 2022-11-29 NOTE — Progress Notes (Signed)
Notified by unit RN that thrombi pad had arrived to unit. Assessment of site showed gauze dressing to be saturated. Dressing removed. Site cleaned and manual pressure held for 10 min. Thrombi pad placed at insertion site of left femoral HD cath. Site covered with gauze and clear dressing. Instructed pt to remain in bed for the next 30 min. If after 30 min site is continuing to saturate through dressing, MD should be notified. Unit RN at bedside and aware of this plan.

## 2022-11-30 ENCOUNTER — Encounter (HOSPITAL_COMMUNITY): Payer: Self-pay | Admitting: Vascular Surgery

## 2022-11-30 LAB — BPAM RBC
Blood Product Expiration Date: 202411272359
ISSUE DATE / TIME: 202411110947
Unit Type and Rh: 6200

## 2022-11-30 LAB — TYPE AND SCREEN
ABO/RH(D): A POS
Antibody Screen: NEGATIVE
Unit division: 0

## 2022-11-30 NOTE — Anesthesia Postprocedure Evaluation (Signed)
Anesthesia Post Note  Patient: Barbara Donovan  Procedure(s) Performed: RIGHT ARM BASILIC VEIN TRANSPOSITION (Right: Arm Upper) INSERTION OF TUNNELED DIALYSIS CATHETER TO LEFT FEMORAL VEIN (Left: Groin)     Patient location during evaluation: PACU Anesthesia Type: General Level of consciousness: awake and alert Pain management: pain level controlled Vital Signs Assessment: post-procedure vital signs reviewed and stable Respiratory status: spontaneous breathing, nonlabored ventilation, respiratory function stable and patient connected to nasal cannula oxygen Cardiovascular status: blood pressure returned to baseline and stable Postop Assessment: no apparent nausea or vomiting Anesthetic complications: no   No notable events documented.                 Shelton Silvas

## 2022-11-30 NOTE — Progress Notes (Addendum)
This note is about the events on 11/11 leading up to the patient's discharge.  The patient's discharge orders were placed by the day team, pending thrombi pad change by the VAS/IV team last night.  When the VAS/IV team changed the pad, they noted significant bleeding through the pad and recommended the RN reevaluate after 30 minutes for continued bleeding and to notify the MD team if bleeding persists (see their note).  We reached out to the RN several times between 9-10pm for an update, but did not receive a response. The patient was discharged prior to the medical team receiving any updates about the patient's bleeding.

## 2022-12-01 ENCOUNTER — Telehealth: Payer: Self-pay | Admitting: Internal Medicine

## 2022-12-01 NOTE — Telephone Encounter (Signed)
Called to f/u with Barbara Donovan regarding new catheter placement and recent discharge. She reports no significant bleeding from the new catheter site and is doing well. F/u visit scheduled 12/3.

## 2022-12-08 ENCOUNTER — Ambulatory Visit (HOSPITAL_COMMUNITY): Admission: RE | Admit: 2022-12-08 | Payer: Medicare Other | Source: Home / Self Care | Admitting: Vascular Surgery

## 2022-12-08 SURGERY — TRANSPOSITION, VEIN, BASILIC
Anesthesia: Choice | Laterality: Right

## 2022-12-21 ENCOUNTER — Ambulatory Visit: Payer: Medicare Other | Admitting: Student

## 2022-12-21 VITALS — BP 157/75 | HR 79 | Temp 97.8°F | Ht 66.0 in | Wt 150.3 lb

## 2022-12-21 DIAGNOSIS — Z992 Dependence on renal dialysis: Secondary | ICD-10-CM | POA: Diagnosis not present

## 2022-12-21 DIAGNOSIS — E1022 Type 1 diabetes mellitus with diabetic chronic kidney disease: Secondary | ICD-10-CM | POA: Diagnosis not present

## 2022-12-21 DIAGNOSIS — N12 Tubulo-interstitial nephritis, not specified as acute or chronic: Secondary | ICD-10-CM

## 2022-12-21 DIAGNOSIS — T8242XS Displacement of vascular dialysis catheter, sequela: Secondary | ICD-10-CM | POA: Diagnosis not present

## 2022-12-21 DIAGNOSIS — I10 Essential (primary) hypertension: Secondary | ICD-10-CM

## 2022-12-21 DIAGNOSIS — N186 End stage renal disease: Secondary | ICD-10-CM | POA: Diagnosis not present

## 2022-12-21 DIAGNOSIS — I120 Hypertensive chronic kidney disease with stage 5 chronic kidney disease or end stage renal disease: Secondary | ICD-10-CM | POA: Diagnosis not present

## 2022-12-21 DIAGNOSIS — E10319 Type 1 diabetes mellitus with unspecified diabetic retinopathy without macular edema: Secondary | ICD-10-CM | POA: Diagnosis not present

## 2022-12-21 DIAGNOSIS — F32A Depression, unspecified: Secondary | ICD-10-CM

## 2022-12-21 DIAGNOSIS — D631 Anemia in chronic kidney disease: Secondary | ICD-10-CM | POA: Diagnosis not present

## 2022-12-21 MED ORDER — HYDRALAZINE HCL 50 MG PO TABS: 100.0000 mg | ORAL_TABLET | Freq: Three times a day (TID) | ORAL | Status: DC

## 2022-12-21 NOTE — Progress Notes (Unsigned)
CC: HFU displacement of vascular catheter  HPI:  Barbara Donovan is a 32 y.o. female living with a history stated below and presents today for HFU. Please see problem based assessment and plan for additional details.  Past Medical History:  Diagnosis Date   Anxiety    Asthma    as a child, no problems as an adult, no inhaler   Cataract    NS OU   Chronic hypertension during pregnancy, antepartum 08/19/2017   Dehydration 01/28/2018   Depression    Depression during pregnancy, antepartum 07/07/2017   6/20: Short trial of zoloft previously, reports didn't help much but also didn't give it a chance Discussed r/b/a SSRIs in pregnancy, agrees to try Zoloft again, rx sent No SI/HI/red flags   Diabetes (HCC)    TYPE I. 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 A2/B GDM)  Required Referrals for A1GDM or A2GDM: [x]  Diabetes Education and Testing Supplies [x]  Nutrition Cousult  For A2/B GDM or higher classes of DM [x]  Diabetes Education and Testing Supplies [x]  Nutrition Counsult [x]  Fetal ECHO after 20 weeks  [x]  Eye exam for retina evaluation - severe retinopathy 7/19  Base   Ventricular septal defect (VSD) of fetus in singleton pregnancy, antepartum 09/30/2017   May go to newborn nursery per Dr. Inwood Bing Echo prior to discharge    Current Outpatient  Medications on File Prior to Visit  Medication Sig Dispense Refill   acetaminophen (TYLENOL) 325 MG tablet Take 650 mg by mouth every 6 (six) hours as needed for mild pain.     amLODipine (NORVASC) 10 MG tablet Take 1 tablet (10 mg total) by mouth daily. (Patient taking differently: Take 10 mg by mouth every evening.) 90 tablet 1   calcitRIOL (ROCALTROL) 0.5 MCG capsule Take 0.5 mcg by mouth every Monday, Wednesday, and Friday with hemodialysis.     carvedilol (COREG) 25 MG tablet Take 1 tablet (25 mg total) by mouth 2 (two) times daily. 180 tablet 3   Continuous Blood Gluc Receiver DEVI 1 Units by Does not apply route 4 (four) times daily -  before meals and at bedtime. May substitute for cheapest monitor with insurance 1 Units 0   Continuous Blood Gluc Sensor (DEXCOM G7 SENSOR) MISC 1 Device by Does not apply route as directed. 9 each 3   ethyl chloride spray Apply 1 Application topically every Monday, Wednesday, and Friday with hemodialysis. Apply to arm     glucose blood (FREESTYLE TEST STRIPS) test strip Use as instructed 100 each 0   HYDROcodone-acetaminophen (NORCO/VICODIN) 5-325 MG tablet Take 1 tablet by mouth every 6 (six) hours as needed for moderate pain (pain score 4-6). 20 tablet 0   insulin degludec (TRESIBA FLEXTOUCH) 200 UNIT/ML FlexTouch Pen Inject 18 Units into the skin daily in the afternoon. 15 mL 4   insulin lispro (HUMALOG KWIKPEN) 100  UNIT/ML KwikPen Max daily 40 units (Patient taking differently: Inject 6 Units into the skin 3 (three) times daily with meals. Max daily 40 units) 30 mL 6   Insulin Pen Needle 32G X 4 MM MISC 1 Device by Does not apply route in the morning, at noon, in the evening, and at bedtime. 400 each 3   losartan (COZAAR) 100 MG tablet Take 100 mg by mouth at bedtime.     metoCLOPramide (REGLAN) 10 MG tablet Take 1 tablet (10 mg total) by mouth every 6 (six) hours. (Patient taking differently: Take 10 mg by mouth every 6 (six) hours as needed for nausea or  vomiting.) 30 tablet 0   mirtazapine (REMERON) 15 MG tablet Take 1 tablet (15 mg total) by mouth at bedtime. Do not take medicine in the morning and drive. 90 tablet 3   multivitamin (RENA-VIT) TABS tablet Take 1 tablet by mouth at bedtime. 90 tablet 1   ondansetron (ZOFRAN-ODT) 8 MG disintegrating tablet Take 1 tablet (8 mg total) by mouth every 8 (eight) hours as needed for nausea or vomiting. 20 tablet 0   SENSIPAR 30 MG tablet Take 30 mg by mouth daily.     sucroferric oxyhydroxide (VELPHORO) 500 MG chewable tablet Chew 1,500 mg by mouth 3 (three) times daily with meals.     No current facility-administered medications on file prior to visit.    Family History  Problem Relation Age of Onset   Diabetes Mother    Aneurysm Mother 34   Seizures Mother    Diabetes Father    Cataracts Father    COPD Father    Heart attack Father    Heart disease Father    Healthy Sister    Healthy Daughter    Stroke Maternal Grandfather    Amblyopia Neg Hx    Blindness Neg Hx    Glaucoma Neg Hx    Macular degeneration Neg Hx    Retinal detachment Neg Hx    Strabismus Neg Hx    Retinitis pigmentosa Neg Hx    Colon cancer Neg Hx    Stomach cancer Neg Hx    Esophageal cancer Neg Hx    Pancreatic cancer Neg Hx    Liver disease Neg Hx     Social History   Socioeconomic History   Marital status: Significant Other    Spouse name: Not on file   Number of children: Not on file   Years of education: Not on file   Highest education level: Not on file  Occupational History   Not on file  Tobacco Use   Smoking status: Never    Passive exposure: Never   Smokeless tobacco: Never  Vaping Use   Vaping status: Never Used  Substance and Sexual Activity   Alcohol use: Not Currently   Drug use: Never   Sexual activity: Not Currently    Birth control/protection: None  Other Topics Concern   Not on file  Social History Narrative   Not on file   Social Determinants of Health   Financial  Resource Strain: Low Risk  (07/06/2022)   Overall Financial Resource Strain (CARDIA)    Difficulty of Paying Living Expenses: Not very hard  Food Insecurity: No Food Insecurity (07/06/2022)   Hunger Vital Sign    Worried About Running Out of Food in the Last Year: Never true    Ran Out of Food in the Last Year: Never true  Transportation Needs: No Transportation Needs (07/06/2022)   PRAPARE -  Administrator, Civil Service (Medical): No    Lack of Transportation (Non-Medical): No  Physical Activity: Inactive (07/06/2022)   Exercise Vital Sign    Days of Exercise per Week: 0 days    Minutes of Exercise per Session: 0 min  Stress: No Stress Concern Present (07/06/2022)   [employer] of Occupational Health - Occupational Stress Questionnaire    Feeling of Stress : Only a little  Social Connections: Socially Isolated (07/06/2022)   Social Connection and Isolation Panel [NHANES]    Frequency of Communication with Friends and Family: Once a week    Frequency of Social Gatherings with Friends and Family: Never    Attends Religious Services: More than 4 times per year    Active Member of [employer] or Organizations: No    Attends Banker Meetings: Never    Marital Status: Never married  Intimate Partner Violence: Not At Risk (07/06/2022)   Humiliation, Afraid, Rape, and Kick questionnaire    Fear of Current or Ex-Partner: No    Emotionally Abused: No    Physically Abused: No    Sexually Abused: No    Review of Systems: ROS negative except for what is noted on the assessment and plan.  Vitals:   12/21/22 1554 12/21/22 1619  BP: (!) 150/83 (!) 157/75  Pulse: 79 79  Temp: 97.8 F (36.6 C)   TempSrc: Oral   SpO2: 100%   Weight: 150 lb 4.8 oz (68.2 kg)   Height: 5\' 6"  (1.676 m)     Physical Exam: Constitutional: well-appearing female sitting in chair, in no acute distress HENT: normocephalic atraumatic, mucous membranes moist Eyes: conjunctiva  non-erythematous Cardiovascular: regular rate and rhythm, no m/r/g Pulmonary/Chest: normal work of breathing on room air, lungs clear to auscultation bilaterally MSK: normal bulk and tone Neurological: alert & oriented x 3, no focal deficit Skin: warm and dry. New R AV fistula in place, palpable thrill, no signs of infection, weeping, skin changes Psych: normal mood and behavior  Assessment & Plan:   Patient discussed with Dr. Antony Contras  Essential hypertension Elevated, 157/75.  She reports that her blood pressure is usually quite good after dialysis, 120s over 80s, and rise.  Will continue her current regimen. - Coreg 25 twice daily, amlodipine 10 daily, hydralazine 100 3 times daily, losartan 100 nightly  Displacement of vascular dialysis catheter [facility]) Hospital follow-up for this issue.  She is ESRD.  She has a left arm fistula that got infected, required a port on the left chest.  This became displaced when she stood up at home.  She was hospitalized for replacement. L femoral vein TD was placed, R arm basilic vein transposition was performed. R fistula with nice thrill. Will see IR on Friday. No concerns.  Anemia due to stage 5 chronic kidney disease (HCC) CBC today.  ESRD on hemodialysis (HCC) BMP today.  She is in touch with nephro.  Type 1 diabetes mellitus with retinopathy [facility]) She sees endocrinology. Insulin degludec 18 daily, Lispro 6 with meals.  Katheran James, D.O. [facility] Internal Medicine, PGY-1 Phone: [phone] Date 12/22/2022 Time 8:34 AM

## 2022-12-22 ENCOUNTER — Encounter: Payer: Self-pay | Admitting: Student

## 2022-12-22 DIAGNOSIS — N12 Tubulo-interstitial nephritis, not specified as acute or chronic: Secondary | ICD-10-CM | POA: Insufficient documentation

## 2022-12-22 LAB — CBC
Hematocrit: 27.1 % — ABNORMAL LOW (ref 34.0–46.6)
Hemoglobin: 8.8 g/dL — ABNORMAL LOW (ref 11.1–15.9)
MCH: 30.7 pg (ref 26.6–33.0)
MCHC: 32.5 g/dL (ref 31.5–35.7)
MCV: 94 fL (ref 79–97)
Platelets: 245 10*3/uL (ref 150–450)
RBC: 2.87 x10E6/uL — ABNORMAL LOW (ref 3.77–5.28)
RDW: 16 % — ABNORMAL HIGH (ref 11.7–15.4)
WBC: 5.2 10*3/uL (ref 3.4–10.8)

## 2022-12-22 LAB — BMP8+ANION GAP
Anion Gap: 18 mmol/L (ref 10.0–18.0)
BUN/Creatinine Ratio: 4 — ABNORMAL LOW (ref 9–23)
BUN: 28 mg/dL — ABNORMAL HIGH (ref 6–20)
CO2: 28 mmol/L (ref 20–29)
Calcium: 9.6 mg/dL (ref 8.7–10.2)
Chloride: 96 mmol/L (ref 96–106)
Creatinine, Ser: 6.91 mg/dL — ABNORMAL HIGH (ref 0.57–1.00)
Glucose: 126 mg/dL — ABNORMAL HIGH (ref 70–99)
Potassium: 4.2 mmol/L (ref 3.5–5.2)
Sodium: 142 mmol/L (ref 134–144)
eGFR: 8 mL/min/{1.73_m2} — ABNORMAL LOW (ref >59)

## 2022-12-22 NOTE — Assessment & Plan Note (Signed)
Elevated, 157/75.  She reports that her blood pressure is usually quite good after dialysis, 120s over 80s, and rise.  Will continue her current regimen. - Coreg 25 twice daily, amlodipine 10 daily, hydralazine 100 3 times daily, losartan 100 nightly

## 2022-12-22 NOTE — Assessment & Plan Note (Signed)
She sees endocrinology. Insulin degludec 18 daily, Lispro 6 with meals.

## 2022-12-22 NOTE — Assessment & Plan Note (Addendum)
BMP today.  She is in touch with nephro.

## 2022-12-22 NOTE — Assessment & Plan Note (Signed)
CBC today.  

## 2022-12-22 NOTE — Assessment & Plan Note (Signed)
Hospital follow-up for this issue.  She is ESRD.  She has a left arm fistula that got infected, required a port on the left chest.  This became displaced when she stood up at home.  She was hospitalized for replacement. L femoral vein TD was placed, R arm basilic vein transposition was performed. R fistula with nice thrill. Will see IR on Friday. No concerns.

## 2022-12-23 NOTE — Addendum Note (Signed)
Addended by: Burnell Blanks on: 12/23/2022 02:05 PM   Modules accepted: Level of Service

## 2022-12-23 NOTE — Progress Notes (Signed)
Internal Medicine Clinic Attending  Case discussed with the resident at the time of the visit.  We reviewed the resident's history and exam and pertinent patient test results.  I agree with the assessment, diagnosis, and plan of care documented in the resident's note.  

## 2022-12-24 ENCOUNTER — Encounter: Payer: Medicare Other | Admitting: Vascular Surgery

## 2022-12-24 ENCOUNTER — Ambulatory Visit (INDEPENDENT_AMBULATORY_CARE_PROVIDER_SITE_OTHER): Payer: Medicare Other | Admitting: Physician Assistant

## 2022-12-24 VITALS — BP 142/76 | HR 87 | Temp 98.7°F | Ht 66.0 in | Wt 147.5 lb

## 2022-12-24 DIAGNOSIS — Z992 Dependence on renal dialysis: Secondary | ICD-10-CM

## 2022-12-24 DIAGNOSIS — N186 End stage renal disease: Secondary | ICD-10-CM

## 2022-12-24 NOTE — Progress Notes (Signed)
    Postoperative Access Visit   History of Present Illness   Barbara Donovan is a 32 y.o. year old female who presents for postoperative follow-up for:  Right arm brachiobasilic fistula transposition and Left common femoral vein tunneled dialysis catheter placement 55 cm palindrome by Dr. Karin Lieu on 11/29/22. The patient's wounds are well healed.  The patient notes no steal symptoms.  The patient is able to complete their activities of daily living.  The patient's current symptoms are: some soreness along the right medial elbow. She is currently dialyzing at the [address] location on MTuesThurs and Fri. She is in process of learning to do home HD.   Physical Examination   Vitals:   12/24/22 1344  BP: (!) 142/76  Pulse: 87  Temp: 98.7 F (37.1 C)  TempSrc: Temporal  SpO2: 98%  Weight: 147 lb 8 oz (66.9 kg)  Height: 5\' 6"  (1.676 m)   Body mass index is 23.81 kg/m.  right arm Incision is well healed, 2+ radial pulse, hand grip is 5/5, sensation in digits is intact, palpable thrill, bruit can  be auscultated     Medical Decision Making   Barbara Donovan is a 32 y.o. year old female who presents s/p :  Right arm brachiobasilic fistula transposition and Left common femoral vein tunneled dialysis catheter placement 55 cm palindrome by Dr. Karin Lieu on 11/29/22. Her incisions are well healed. Her fistula is patent with excellent thrill.  Patent is without signs or symptoms of steal syndrome The patient's access will be ready for use after 12/29/22 The patient's tunneled dialysis catheter can be removed when Nephrology is comfortable with the performance of the right Brachiobasilic AV fistula The patient may follow up on a prn basis   Graceann Congress, PA-C [facility] Office: [phone]  Clinic MD: Hetty Blend

## 2023-01-15 ENCOUNTER — Other Ambulatory Visit: Payer: Self-pay

## 2023-01-15 ENCOUNTER — Encounter (HOSPITAL_COMMUNITY): Payer: Self-pay

## 2023-01-15 ENCOUNTER — Inpatient Hospital Stay (HOSPITAL_COMMUNITY)
Admission: EM | Admit: 2023-01-15 | Discharge: 2023-01-18 | DRG: 393 | Disposition: A | Discharge status: Home / Self Care | Payer: Medicare Other | Attending: Internal Medicine | Admitting: Internal Medicine

## 2023-01-15 ENCOUNTER — Emergency Department (HOSPITAL_COMMUNITY): Payer: Medicare Other

## 2023-01-15 DIAGNOSIS — K3580 Unspecified acute appendicitis: Secondary | ICD-10-CM | POA: Diagnosis present

## 2023-01-15 DIAGNOSIS — J45909 Unspecified asthma, uncomplicated: Secondary | ICD-10-CM | POA: Diagnosis present

## 2023-01-15 DIAGNOSIS — E1065 Type 1 diabetes mellitus with hyperglycemia: Secondary | ICD-10-CM | POA: Diagnosis present

## 2023-01-15 DIAGNOSIS — E878 Other disorders of electrolyte and fluid balance, not elsewhere classified: Secondary | ICD-10-CM | POA: Diagnosis present

## 2023-01-15 DIAGNOSIS — Z833 Family history of diabetes mellitus: Secondary | ICD-10-CM | POA: Diagnosis not present

## 2023-01-15 DIAGNOSIS — Z825 Family history of asthma and other chronic lower respiratory diseases: Secondary | ICD-10-CM | POA: Diagnosis not present

## 2023-01-15 DIAGNOSIS — Z823 Family history of stroke: Secondary | ICD-10-CM | POA: Diagnosis not present

## 2023-01-15 DIAGNOSIS — Z992 Dependence on renal dialysis: Secondary | ICD-10-CM | POA: Diagnosis not present

## 2023-01-15 DIAGNOSIS — I10 Essential (primary) hypertension: Secondary | ICD-10-CM | POA: Diagnosis present

## 2023-01-15 DIAGNOSIS — R9431 Abnormal electrocardiogram [ECG] [EKG]: Secondary | ICD-10-CM | POA: Diagnosis present

## 2023-01-15 DIAGNOSIS — K37 Unspecified appendicitis: Secondary | ICD-10-CM | POA: Diagnosis not present

## 2023-01-15 DIAGNOSIS — Z794 Long term (current) use of insulin: Secondary | ICD-10-CM | POA: Diagnosis not present

## 2023-01-15 DIAGNOSIS — E10649 Type 1 diabetes mellitus with hypoglycemia without coma: Secondary | ICD-10-CM | POA: Diagnosis present

## 2023-01-15 DIAGNOSIS — Z8249 Family history of ischemic heart disease and other diseases of the circulatory system: Secondary | ICD-10-CM

## 2023-01-15 DIAGNOSIS — M898X9 Other specified disorders of bone, unspecified site: Secondary | ICD-10-CM | POA: Diagnosis present

## 2023-01-15 DIAGNOSIS — E1022 Type 1 diabetes mellitus with diabetic chronic kidney disease: Secondary | ICD-10-CM | POA: Diagnosis present

## 2023-01-15 DIAGNOSIS — E10319 Type 1 diabetes mellitus with unspecified diabetic retinopathy without macular edema: Secondary | ICD-10-CM | POA: Diagnosis present

## 2023-01-15 DIAGNOSIS — I120 Hypertensive chronic kidney disease with stage 5 chronic kidney disease or end stage renal disease: Secondary | ICD-10-CM | POA: Diagnosis present

## 2023-01-15 DIAGNOSIS — I160 Hypertensive urgency: Secondary | ICD-10-CM | POA: Diagnosis present

## 2023-01-15 DIAGNOSIS — Z7682 Awaiting organ transplant status: Secondary | ICD-10-CM

## 2023-01-15 DIAGNOSIS — N2581 Secondary hyperparathyroidism of renal origin: Secondary | ICD-10-CM | POA: Diagnosis present

## 2023-01-15 DIAGNOSIS — Z820 Family history of epilepsy and other diseases of the nervous system: Secondary | ICD-10-CM | POA: Diagnosis not present

## 2023-01-15 DIAGNOSIS — R748 Abnormal levels of other serum enzymes: Secondary | ICD-10-CM | POA: Diagnosis present

## 2023-01-15 DIAGNOSIS — N186 End stage renal disease: Secondary | ICD-10-CM | POA: Diagnosis present

## 2023-01-15 DIAGNOSIS — D631 Anemia in chronic kidney disease: Secondary | ICD-10-CM | POA: Diagnosis present

## 2023-01-15 DIAGNOSIS — Z79899 Other long term (current) drug therapy: Secondary | ICD-10-CM | POA: Diagnosis not present

## 2023-01-15 LAB — COMPREHENSIVE METABOLIC PANEL
ALT: 18 U/L (ref 0–44)
AST: 25 U/L (ref 15–41)
Albumin: 4.6 g/dL (ref 3.5–5.0)
Alkaline Phosphatase: 191 U/L — ABNORMAL HIGH (ref 38–126)
Anion gap: 23 — ABNORMAL HIGH (ref 5–15)
BUN: 40 mg/dL — ABNORMAL HIGH (ref 6–20)
CO2: 23 mmol/L (ref 22–32)
Calcium: 10 mg/dL (ref 8.9–10.3)
Chloride: 89 mmol/L — ABNORMAL LOW (ref 98–111)
Creatinine, Ser: 11.13 mg/dL — ABNORMAL HIGH (ref 0.44–1.00)
GFR, Estimated: 4 mL/min — ABNORMAL LOW (ref >60)
Glucose, Bld: 167 mg/dL — ABNORMAL HIGH (ref 70–99)
Potassium: 4.8 mmol/L (ref 3.5–5.1)
Sodium: 135 mmol/L (ref 135–145)
Total Bilirubin: 1.1 mg/dL (ref <1.2)
Total Protein: 8.8 g/dL — ABNORMAL HIGH (ref 6.5–8.1)

## 2023-01-15 LAB — CBC WITH DIFFERENTIAL/PLATELET
Abs Immature Granulocytes: 0.04 10*3/uL (ref 0.00–0.07)
Basophils Absolute: 0 10*3/uL (ref 0.0–0.1)
Basophils Relative: 1 %
Eosinophils Absolute: 0 10*3/uL (ref 0.0–0.5)
Eosinophils Relative: 0 %
HCT: 40.5 % (ref 36.0–46.0)
Hemoglobin: 12.5 g/dL (ref 12.0–15.0)
Immature Granulocytes: 1 %
Lymphocytes Relative: 9 %
Lymphs Abs: 0.7 10*3/uL (ref 0.7–4.0)
MCH: 28.7 pg (ref 26.0–34.0)
MCHC: 30.9 g/dL (ref 30.0–36.0)
MCV: 93.1 fL (ref 80.0–100.0)
Monocytes Absolute: 0.5 10*3/uL (ref 0.1–1.0)
Monocytes Relative: 6 %
Neutro Abs: 6.4 10*3/uL (ref 1.7–7.7)
Neutrophils Relative %: 83 %
Platelets: 161 10*3/uL (ref 150–400)
RBC: 4.35 MIL/uL (ref 3.87–5.11)
RDW: 18.3 % — ABNORMAL HIGH (ref 11.5–15.5)
WBC: 7.7 10*3/uL (ref 4.0–10.5)
nRBC: 0.4 % — ABNORMAL HIGH (ref 0.0–0.2)

## 2023-01-15 LAB — HCG, SERUM, QUALITATIVE: Preg, Serum: NEGATIVE

## 2023-01-15 LAB — CBG MONITORING, ED: Glucose-Capillary: 144 mg/dL — ABNORMAL HIGH (ref 70–99)

## 2023-01-15 LAB — LIPASE, BLOOD: Lipase: 39 U/L (ref 11–51)

## 2023-01-15 MED ORDER — HYDRALAZINE HCL 50 MG PO TABS
100.0000 mg | ORAL_TABLET | Freq: Three times a day (TID) | ORAL | Status: DC
  Administered 2023-01-15 – 2023-01-18 (×8): 100 mg via ORAL
  Filled 2023-01-15 (×8): qty 2

## 2023-01-15 MED ORDER — ACETAMINOPHEN 650 MG RE SUPP: 650.0000 mg | Freq: Four times a day (QID) | RECTAL | Status: DC | PRN

## 2023-01-15 MED ORDER — SODIUM CHLORIDE 0.9 % IV SOLN
2.0000 g | INTRAVENOUS | Status: DC
  Administered 2023-01-16 – 2023-01-17 (×2): 2 g via INTRAVENOUS
  Filled 2023-01-15 (×2): qty 20

## 2023-01-15 MED ORDER — METRONIDAZOLE 500 MG/100ML IV SOLN
500.0000 mg | Freq: Two times a day (BID) | INTRAVENOUS | Status: DC
  Administered 2023-01-16 – 2023-01-17 (×4): 500 mg via INTRAVENOUS
  Filled 2023-01-15 (×4): qty 100

## 2023-01-15 MED ORDER — HYDROMORPHONE HCL 1 MG/ML IJ SOLN
0.5000 mg | INTRAMUSCULAR | Status: DC | PRN
  Administered 2023-01-16 – 2023-01-18 (×6): 1 mg via INTRAVENOUS
  Filled 2023-01-15 (×7): qty 1

## 2023-01-15 MED ORDER — CARVEDILOL 25 MG PO TABS
25.0000 mg | ORAL_TABLET | Freq: Two times a day (BID) | ORAL | Status: DC
  Administered 2023-01-15 – 2023-01-18 (×6): 25 mg via ORAL
  Filled 2023-01-15: qty 2
  Filled 2023-01-15 (×2): qty 1
  Filled 2023-01-15 (×2): qty 2
  Filled 2023-01-15: qty 1

## 2023-01-15 MED ORDER — INSULIN ASPART 100 UNIT/ML IJ SOLN: 0.0000 [IU] | Freq: Three times a day (TID) | INTRAMUSCULAR | Status: DC

## 2023-01-15 MED ORDER — POLYETHYLENE GLYCOL 3350 17 G PO PACK
17.0000 g | PACK | Freq: Every day | ORAL | Status: DC
  Filled 2023-01-15 (×2): qty 1

## 2023-01-15 MED ORDER — ONDANSETRON HCL 4 MG/2ML IJ SOLN: 4.0000 mg | Freq: Four times a day (QID) | INTRAMUSCULAR | Status: DC | PRN

## 2023-01-15 MED ORDER — CARVEDILOL 12.5 MG PO TABS: 25.0000 mg | ORAL_TABLET | Freq: Two times a day (BID) | ORAL | Status: DC

## 2023-01-15 MED ORDER — ONDANSETRON HCL 4 MG PO TABS: 4.0000 mg | ORAL_TABLET | Freq: Four times a day (QID) | ORAL | Status: DC | PRN

## 2023-01-15 MED ORDER — CEFTRIAXONE SODIUM 2 G IJ SOLR
2.0000 g | Freq: Once | INTRAMUSCULAR | Status: AC
  Administered 2023-01-15: 2 g via INTRAVENOUS
  Filled 2023-01-15: qty 20

## 2023-01-15 MED ORDER — CINACALCET HCL 30 MG PO TABS
30.0000 mg | ORAL_TABLET | Freq: Every day | ORAL | Status: DC
  Administered 2023-01-16 – 2023-01-18 (×3): 30 mg via ORAL
  Filled 2023-01-15 (×3): qty 1

## 2023-01-15 MED ORDER — TRIMETHOBENZAMIDE HCL 100 MG/ML IM SOLN: 200.0000 mg | Freq: Three times a day (TID) | INTRAMUSCULAR | Status: DC | PRN

## 2023-01-15 MED ORDER — METOCLOPRAMIDE HCL 5 MG/ML IJ SOLN
10.0000 mg | Freq: Once | INTRAMUSCULAR | Status: AC
Start: 2023-01-15 — End: 2023-01-15
  Administered 2023-01-15: 10 mg via INTRAVENOUS
  Filled 2023-01-15: qty 2

## 2023-01-15 MED ORDER — METRONIDAZOLE 500 MG/100ML IV SOLN
500.0000 mg | Freq: Once | INTRAVENOUS | Status: AC
  Administered 2023-01-15: 500 mg via INTRAVENOUS
  Filled 2023-01-15: qty 100

## 2023-01-15 MED ORDER — MIRTAZAPINE 15 MG PO TABS
15.0000 mg | ORAL_TABLET | Freq: Every day | ORAL | Status: DC
Start: 2023-01-15 — End: 2023-01-18
  Administered 2023-01-15 – 2023-01-17 (×3): 15 mg via ORAL
  Filled 2023-01-15 (×3): qty 1

## 2023-01-15 MED ORDER — AMLODIPINE BESYLATE 10 MG PO TABS
10.0000 mg | ORAL_TABLET | Freq: Every day | ORAL | Status: DC
  Administered 2023-01-15 – 2023-01-18 (×4): 10 mg via ORAL
  Filled 2023-01-15 (×2): qty 1
  Filled 2023-01-15 (×2): qty 2

## 2023-01-15 MED ORDER — OXYCODONE HCL 5 MG PO TABS
5.0000 mg | ORAL_TABLET | ORAL | Status: DC | PRN
  Administered 2023-01-16: 5 mg via ORAL
  Filled 2023-01-15: qty 1

## 2023-01-15 MED ORDER — SUCROFERRIC OXYHYDROXIDE 500 MG PO CHEW
1500.0000 mg | CHEWABLE_TABLET | Freq: Three times a day (TID) | ORAL | Status: DC
Start: 2023-01-16 — End: 2023-01-18
  Administered 2023-01-17 – 2023-01-18 (×2): 1500 mg via ORAL
  Filled 2023-01-15 (×10): qty 3

## 2023-01-15 MED ORDER — AMLODIPINE BESYLATE 5 MG PO TABS: 10.0000 mg | ORAL_TABLET | Freq: Every day | ORAL | Status: DC

## 2023-01-15 MED ORDER — CALCITRIOL 0.5 MCG PO CAPS
0.5000 ug | ORAL_CAPSULE | ORAL | Status: DC
  Administered 2023-01-17: 0.5 ug via ORAL
  Filled 2023-01-15: qty 1

## 2023-01-15 MED ORDER — ACETAMINOPHEN 325 MG PO TABS: 650.0000 mg | ORAL_TABLET | Freq: Four times a day (QID) | ORAL | Status: DC | PRN

## 2023-01-15 MED ORDER — INSULIN ASPART 100 UNIT/ML IJ SOLN
0.0000 [IU] | Freq: Every day | INTRAMUSCULAR | Status: DC
  Administered 2023-01-17: 3 [IU] via SUBCUTANEOUS

## 2023-01-15 MED ORDER — HYDROMORPHONE HCL 1 MG/ML IJ SOLN
1.0000 mg | Freq: Once | INTRAMUSCULAR | Status: AC
  Administered 2023-01-15: 1 mg via INTRAVENOUS
  Filled 2023-01-15: qty 1

## 2023-01-15 NOTE — ED Notes (Signed)
ED TO INPATIENT HANDOFF REPORT  ED Nurse Name and Phone #:  Les Pou RN [phone]  S Name/Age/Gender Barbara Donovan 32 y.o. female Room/Bed: 033C/033C  Code Status   Code Status: Full Code  Home/SNF/Other Home Patient oriented to: self, place, time, and situation Is this baseline? Yes   Triage Complete: Triage complete  Chief Complaint Acute appendicitis [K35.80]  Triage Note Patient BIB [facility] from home for N/V and abdominal pain starting this afternoon. Patient describes pain as cramping and has had many episodes of vomiting. EMS gave 4mg  zofran IM with minimal relief. Patient is hypertensive, 205/129, HR 95, 100% RA, CBG 188, RR 20.Patient has not missed any dialysis treatments.    Allergies No Known Allergies  Level of Care/Admitting Diagnosis ED Disposition     ED Disposition  Admit   Condition  --   Comment  Hospital Area: [facility] [[id number]]  Level of Care: Med-Surg [16]  May admit patient to [facility] or [facility] if equivalent level of care is available:: No  Covid Evaluation: Asymptomatic - no recent exposure (last 10 days) testing not required  Diagnosis: Acute appendicitis [454098]  Admitting Physician: Miguel Aschoff [[id number]]  Attending Physician: Miguel Aschoff [[id number]]  Certification:: I certify this patient will need inpatient services for at least 2 midnights  Expected Medical Readiness: 01/19/2023          B Medical/Surgery History Past Medical History:  Diagnosis Date   Anxiety    Asthma    as a child, no problems as an adult, no inhaler   Cataract    NS OU   Chronic hypertension during pregnancy, antepartum 08/19/2017   Dehydration 01/28/2018   Depression    Depression during pregnancy, antepartum 07/07/2017   6/20: Short trial of zoloft previously, reports didn't help much but also didn't give it a chance Discussed r/b/a SSRIs in pregnancy, agrees to try Zoloft again, rx sent No SI/HI/red flags    Diabetes (HCC)    TYPE I. 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 A2/B GDM)  Required Referrals for A1GDM or A2GDM: [x]  Diabetes Education and Testing Supplies [x]  Nutrition Cousult  For A2/B GDM or higher classes of DM [x]  Diabetes Education and Testing Supplies [x]  Nutrition Counsult [x]  Fetal ECHO after 20 weeks  [x]  Eye exam for retina evaluation - severe retinopathy 7/19  Base   Ventricular septal defect (VSD) of fetus in singleton pregnancy, antepartum 09/30/2017   May go to newborn nursery per Dr. [place] [facility] prior to discharge   Past Surgical History:  Procedure Laterality Date   25 GAUGE PARS PLANA VITRECTOMY WITH 20 GAUGE MVR PORT FOR MACULAR HOLE Left 07/20/2018   Procedure: 25 GAUGE PARS PLANA VITRECTOMY LEFT EYE;  Surgeon: Rennis Chris, MD;  Location: [facility] OR;  Service: Ophthalmology;  Laterality: Left;   AV FISTULA  PLACEMENT Left 08/11/2021   Procedure: INSERTION OF LEFT ARM ARTERIOVENOUS (AV) GORE-TEX GRAFT;  Surgeon: Victorino Sparrow, MD;  Location: [facility] OR;  Service: Vascular;  Laterality: Left;  PERIPHERAL NERVE BLOCK   AV FISTULA PLACEMENT Right 08/24/2022   Procedure: RIGHT UPPER EXTREMITY ARTERIOVENOUS (AV) FISTULA CREATION;  Surgeon: Chuck Hint, MD;  Location: [facility] OR;  Service: Vascular;  Laterality: Right;   AVGG REMOVAL Left 06/22/2022    Procedure: REMOVAL OF ARTERIOVENOUS GORETEX GRAFT (AVGG) LEFT ARM WITH VEIN PATCH OF BRACHIAL ARTERY;  Surgeon: Chuck Hint, MD;  Location: [facility] OR;  Service: Vascular;  Laterality: Left;   BASCILIC VEIN TRANSPOSITION Right 11/29/2022   Procedure: RIGHT ARM BASILIC VEIN TRANSPOSITION;  Surgeon: Victorino Sparrow, MD;  Location: [facility] OR;  Service: Vascular;  Laterality: Right;   CAPD INSERTION N/A 09/10/2020   Procedure: LAPAROSCOPIC INSERTION CONTINUOUS AMBULATORY PERITONEAL DIALYSIS  (CAPD) CATHETER;  Surgeon: Nada Libman, MD;  Location: [facility] OR;  Service: Vascular;  Laterality: N/A;   CESAREAN SECTION N/A 12/26/2017   Procedure: CESAREAN SECTION;  Surgeon: Tereso Newcomer, MD;  Location: [facility];  Service: Obstetrics;  Laterality: N/A;   EYE EXAMINATION UNDER ANESTHESIA Right 07/20/2018   Procedure: Eye Exam Under Anesthesia RIGHT EYE;  Surgeon: Rennis Chris, MD;  Location: [facility] OR;  Service: Ophthalmology;  Laterality: Right;   EYE SURGERY Left 07/2018   GAS INSERTION Left 07/19/2019   Procedure: INSERTION OF GAS;  Surgeon: Rennis Chris, MD;  Location: [facility] OR;  Service: Ophthalmology;  Laterality: Left;  SF6   INJECTION OF SILICONE OIL Left 07/20/2018   Procedure: Injection Of Silicone Oil LEFT EYE;  Surgeon: Rennis Chris, MD;  Location: [facility] OR;  Service: Ophthalmology;  Laterality: Left;   INSERTION OF DIALYSIS CATHETER Right 07/05/2021   Procedure: REMOVAL OF DIALYSIS CATHETER;  Surgeon: Leonie Douglas, MD;  Location: [facility] OR;  Service: Vascular;  Laterality: Right;   INSERTION OF DIALYSIS CATHETER Right 07/09/2021   Procedure: INSERTION OF TUNNELED DIALYSIS CATHETER;  Surgeon: Leonie Douglas, MD;  Location: [facility] OR;  Service: Vascular;  Laterality: Right;   INSERTION OF DIALYSIS CATHETER Left 07/09/2021   Procedure: INSERTION OF TUNNELED  DIALYSIS CATHETER;  Surgeon: Leonie Douglas, MD;  Location: [facility] OR;  Service: Vascular;  Laterality: Left;   INSERTION OF DIALYSIS CATHETER N/A  06/22/2022   Procedure: INSERTION OF LEFT INTERNAL JUGULAR TRIALYSIS DIALYSIS CATHETER;  Surgeon: Chuck Hint, MD;  Location: [facility] OR;  Service: Vascular;  Laterality: N/A;   INSERTION OF DIALYSIS CATHETER Left 06/28/2022   Procedure: ULTRASOUND GUIDED INSERTION OF TUNNELED DIALYSIS CATHETER;  Surgeon: Leonie Douglas, MD;  Location: [facility] OR;  Service: Vascular;  Laterality: Left;   INSERTION OF DIALYSIS CATHETER Left 11/29/2022   Procedure: INSERTION OF TUNNELED DIALYSIS CATHETER TO LEFT FEMORAL VEIN;  Surgeon: Victorino Sparrow, MD;  Location: [facility] OR;  Service: Vascular;  Laterality: Left;   IR FLUORO GUIDE CV LINE RIGHT  06/02/2020   IR US GUIDE VASC ACCESS RIGHT  06/02/2020   LASER PHOTO ABLATION Right 07/20/2018   Procedure: Laser Photo Ablation RIGHT EYE;  Surgeon: Rennis Chris, MD;  Location: [facility] OR;  Service: Ophthalmology;  Laterality: Right;   MEMBRANE PEEL Left 07/20/2018   Procedure: Membrane Peel LEFT EYE;  Surgeon: Rennis Chris, MD;  Location: [facility] OR;  Service: Ophthalmology;  Laterality: Left;   MEMBRANE PEEL Left 07/19/2019   Procedure: MEMBRANE PEEL;  Surgeon: Rennis Chris, MD;  Location: [facility] OR;  Service: Ophthalmology;  Laterality: Left;   MITOMYCIN C APPLICATION Bilateral 07/20/2018   Procedure: Avastin Application;  Surgeon: Rennis Chris, MD;  Location: [facility] OR;  Service: Ophthalmology;  Laterality: Bilateral;   PHOTOCOAGULATION WITH LASER Left 07/20/2018   Procedure: Photocoagulation With Laser LEFT EYE;  Surgeon: Rennis Chris, MD;  Location: [facility] OR;  Service: Ophthalmology;  Laterality: Left;   PHOTOCOAGULATION WITH LASER Left 07/19/2019   Procedure: PHOTOCOAGULATION WITH LASER;  Surgeon: Rennis Chris, MD;  Location: [facility] OR;  Service: Ophthalmology;  Laterality: Left;   RETINAL DETACHMENT SURGERY Left 07/20/2018   Dr. Rennis Chris   SILICON OIL REMOVAL Left 07/19/2019   Procedure: 25g PARS PLANA VITRECTOMY WITH SILICON OIL REMOVAL;  Surgeon: Rennis Chris, MD;  Location: [facility] OR;   Service: Ophthalmology;  Laterality: Left;   TEE WITHOUT CARDIOVERSION N/A 07/08/2021   Procedure: TRANSESOPHAGEAL ECHOCARDIOGRAM (TEE);  Surgeon: Little Ishikawa, MD;  Location: [facility] ENDOSCOPY;  Service: Cardiovascular;  Laterality: N/A;   TEE WITHOUT CARDIOVERSION N/A 06/29/2022   Procedure: TRANSESOPHAGEAL ECHOCARDIOGRAM;  Surgeon: Wendall Stade, MD;  Location: [facility] INVASIVE CV LAB;  Service: Cardiovascular;  Laterality: N/A;   WISDOM TOOTH EXTRACTION       A IV Location/Drains/Wounds Patient Lines/Drains/Airways Status     Active Line/Drains/Airways     Name Placement date Placement time Site Days   Peripheral IV 01/15/23 20 G Left;Proximal Forearm 01/15/23  2007  Forearm  less than 1   Fistula / Graft Left Upper arm Arteriovenous vein graft 08/11/21  1103  Upper arm  522   Fistula / Graft Right Upper arm Arteriovenous fistula 08/24/22  1247  Upper arm  144   Hemodialysis Catheter Left Femoral vein Double lumen Permanent (Tunneled) 11/29/22  1415  Femoral vein  47   Open Drain 1 Left;Anterior Other (Comment) 06/22/22  1147  Other (Comment)  207            Intake/Output Last 24 hours No intake or output data in the 24 hours ending 01/15/23 2259  Labs/Imaging Results for orders placed or performed during the hospital encounter of 01/15/23 (from the past 48 hours)  Comprehensive metabolic panel     Status: Abnormal   Collection Time: 01/15/23  8:10 PM  Result Value Ref Range   Sodium 135 135 - 145 mmol/L   Potassium 4.8 3.5 - 5.1 mmol/L   Chloride 89 (L) 98 - 111 mmol/L   CO2 23 22 - 32 mmol/L   Glucose, Bld 167 (H) 70 - 99 mg/dL    Comment: Glucose reference range applies only to samples taken after fasting for at least 8 hours.   BUN 40 (H) 6 - 20 mg/dL   Creatinine, Ser 16.10 (H) 0.44 - 1.00 mg/dL   Calcium 96.0 8.9 - 45.4 mg/dL   Total Protein 8.8 (H) 6.5 - 8.1 g/dL   Albumin 4.6 3.5 - 5.0 g/dL   AST 25 15 - 41 U/L   ALT 18 0 - 44 U/L   Alkaline Phosphatase  191 (H) 38 - 126 U/L   Total Bilirubin 1.1 <1.2 mg/dL   GFR, Estimated 4 (L) >60 mL/min    Comment: (NOTE) Calculated using the CKD-EPI Creatinine Equation (2021)    Anion gap 23 (H) 5 - 15    Comment: ELECTROLYTES REPEATED TO VERIFY Performed at [facility], [address] [place], [place] [address]   CBC with Differential     Status: Abnormal   Collection Time: 01/15/23  8:10 PM  Result Value Ref Range   WBC 7.7 4.0 - 10.5 K/uL   RBC 4.35 3.87 - 5.11 MIL/uL   Hemoglobin 12.5 12.0 - 15.0 g/dL   HCT 16.1 09.6 - 04.5 %   MCV 93.1 80.0 - 100.0 fL   MCH 28.7 26.0 - 34.0 pg   MCHC 30.9 30.0 - 36.0 g/dL   RDW 40.9 (H) 81.1 - 91.4 %   Platelets 161 150 - 400 K/uL    Comment: REPEATED TO VERIFY   nRBC 0.4 (H) 0.0 - 0.2 %   Neutrophils Relative % 83 %   Neutro Abs 6.4 1.7 - 7.7 K/uL   Lymphocytes Relative 9 %   Lymphs Abs 0.7 0.7 - 4.0 K/uL   Monocytes Relative 6 %   Monocytes Absolute 0.5 0.1 - 1.0 K/uL   Eosinophils Relative 0 %   Eosinophils Absolute 0.0 0.0 - 0.5 K/uL   Basophils Relative 1 %   Basophils Absolute 0.0 0.0 - 0.1 K/uL   Immature Granulocytes 1 %   Abs Immature Granulocytes 0.04 0.00 - 0.07 K/uL    Comment: Performed at [facility], [address], [place], [place] [address]  Lipase, blood     Status: None   Collection Time: 01/15/23  8:10 PM  Result Value Ref Range   Lipase 39 11 - 51 U/L    Comment: Performed at [facility], [address], [place], [place] [address]  hCG, serum, qualitative     Status: None   Collection Time: 01/15/23  8:10 PM  Result Value Ref Range   Preg, Serum NEGATIVE NEGATIVE    Comment:        THE SENSITIVITY OF THIS METHODOLOGY IS >10 mIU/mL. Performed at [facility], [address] [place], [place] [address]    CT ABDOMEN PELVIS WO CONTRAST Result Date: 01/15/2023 CLINICAL DATA:  Acute onset abdominal pain with nausea and vomiting EXAM: CT ABDOMEN AND PELVIS WITHOUT CONTRAST TECHNIQUE:  Multidetector CT imaging of the abdomen and pelvis was performed following the standard protocol without IV contrast. RADIATION DOSE REDUCTION: This exam was performed according to the departmental dose-optimization program which includes automated exposure control, adjustment of the mA and/or kV according to patient size and/or use of iterative reconstruction technique. COMPARISON:  CT abdomen and pelvis dated 07/03/2021 FINDINGS: Lower chest: Bibasilar punctate calcifications again seen. No pleural effusion or pneumothorax demonstrated. Partially imaged heart size is normal. Hepatobiliary: No focal hepatic lesions. No intra or extrahepatic biliary ductal dilation. Normal gallbladder. Pancreas: No focal lesions or main ductal dilation. Spleen: Normal in size without focal abnormality. Adrenals/Urinary Tract: No adrenal nodules. No suspicious renal mass on this noncontrast enhanced examination, calculi or hydronephrosis. Bladder is decompressed. Stomach/Bowel: Normal appearance of the stomach. No evidence of bowel wall thickening, distention, or inflammatory changes. Small volume stool within the ascending and transverse colon. Mildly dilated retrocecal appendix measures up to 11 mm (4:52), previously 6 mm on 07/03/2021. Vascular/Lymphatic: Vascular catheter reaches the right atrium. Aortic atherosclerosis. No enlarged abdominal or pelvic lymph nodes. Reproductive: No adnexal masses. Other: Minimal periappendiceal stranding. No free air or fluid collection. Musculoskeletal: No acute or abnormal lytic or blastic osseous lesions. IMPRESSION: 1. Mildly dilated retrocecal appendix measures up to 11 mm, previously 6 mm on 07/03/2021. Minimal periappendiceal stranding. Findings may reflect early acute appendicitis. 2.  Aortic Atherosclerosis (ICD10-I70.0). Electronically Signed   By: Agustin Cree M.D.   On: 01/15/2023 21:36    Pending Labs [employer] (  From admission, onward)    None       Vitals/Pain Today's  Vitals   01/15/23 2052 01/15/23 2100 01/15/23 2114 01/15/23 2230  BP:  (!) 157/97  (!) 187/104  Pulse:  91  90  Resp:  16  14  Temp:      TempSrc:      SpO2:  100%  100%  Weight:      Height:      PainSc: 0-No pain  0-No pain     Isolation Precautions No active isolations  Medications Medications  acetaminophen (TYLENOL) tablet 650 mg (has no administration in time range)    Or  acetaminophen (TYLENOL) suppository 650 mg (has no administration in time range)  HYDROmorphone (DILAUDID) injection 0.5-1 mg (has no administration in time range)  oxyCODONE (Oxy IR/ROXICODONE) immediate release tablet 5 mg (has no administration in time range)  polyethylene glycol (MIRALAX / GLYCOLAX) packet 17 g (has no administration in time range)  trimethobenzamide (TIGAN) injection 200 mg (has no administration in time range)  HYDROmorphone (DILAUDID) injection 1 mg (1 mg Intravenous Given 01/15/23 2018)  metoCLOPramide (REGLAN) injection 10 mg (10 mg Intravenous Given 01/15/23 2018)  cefTRIAXone (ROCEPHIN) 2 g in sodium chloride 0.9 % 100 mL IVPB (0 g Intravenous Stopped 01/15/23 2254)    And  metroNIDAZOLE (FLAGYL) IVPB 500 mg (0 mg Intravenous Stopped 01/15/23 2259)    Mobility walks     Focused Assessments     R Recommendations: See Admitting Provider Note  Report given to:   Additional Notes:

## 2023-01-15 NOTE — ED Notes (Signed)
Unable to give urine specimen at this time .  

## 2023-01-15 NOTE — Consult Note (Incomplete)
Reason for Consult/Chief Complaint: acute appendicitis Consultant: Criss Alvine, MD  Barbara Donovan is an 32 y.o. female.   HPI: 58F with acute onset of crampy abdominal pain diffusely across lower abdomen. Unrelieved with bowel movement, symptoms progressed to nausea and vomiting over the course of the day.  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 Last HD session 12/27, still makes small volume urine once daily, undergoing eval for KTxp ([facility]).   Prior CS in 2019.    Past Medical History:  Diagnosis Date  . Anxiety   . Asthma    as a child, no problems as an adult, no inhaler  . Cataract    NS OU  . Chronic hypertension during pregnancy, antepartum 08/19/2017  . Dehydration 01/28/2018  . Depression   . Depression during pregnancy, antepartum 07/07/2017   6/20: Short trial of zoloft previously, reports didn't help much but also didn't give it a chance Discussed r/b/a SSRIs in pregnancy, agrees to try Zoloft again, rx sent No SI/HI/red flags  . Diabetes (HCC)    TYPE I. A1C 7.5% 05/31/20  . Diabetic retinopathy (HCC) 06/09/2017   07/2017 with bilateral severe diabetic non-proliferative retinopathy with macular edema.  Marland Kitchen ESRD on peritoneal dialysis (HCC)   . HTN (hypertension)   . Hypertensive retinopathy    OU  . Hypokalemia 01/22/2018  . Hypomagnesemia 01/28/2018  . Intractable nausea and vomiting 01/22/2018  . Intrauterine growth restriction (IUGR) affecting care of mother 12/22/2017  . Morbid obesity (HCC)   . MSSA bacteremia 07/04/2021  . 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 Severe hyperemesis gravidarum 10/30/2017  . Type I diabetes mellitus (HCC) 07/07/2017   Current Diabetic Medications:  Insulin  [x]  Aspirin 81 mg daily after 12 weeks (? A2/B GDM)  Required Referrals for A1GDM or A2GDM: [x]  Diabetes Education and Testing Supplies [x]  Nutrition Cousult  For A2/B GDM or higher classes of DM  [x]  Diabetes Education and Testing Supplies [x]  Nutrition Counsult [x]  Fetal ECHO after 20 weeks  [x]  Eye exam for retina evaluation - severe retinopathy 7/19  Base  . Ventricular septal defect (VSD) of fetus in singleton pregnancy, antepartum 09/30/2017   May go to newborn nursery per Dr.  Bing Echo prior to discharge    Past Surgical History:  Procedure Laterality Date  . 25 GAUGE PARS PLANA VITRECTOMY WITH 20 GAUGE MVR PORT FOR MACULAR HOLE Left 07/20/2018   Procedure: 25 GAUGE PARS PLANA VITRECTOMY LEFT EYE;  Surgeon: Rennis Chris, MD;  Location: [facility] OR;  Service: Ophthalmology;  Laterality: Left;  . AV FISTULA PLACEMENT Left 08/11/2021   Procedure: INSERTION OF LEFT ARM ARTERIOVENOUS (AV) GORE-TEX GRAFT;  Surgeon: Victorino Sparrow, MD;  Location: [facility] OR;  Service: Vascular;  Laterality: Left;  PERIPHERAL NERVE BLOCK  . AV FISTULA PLACEMENT Right 08/24/2022   Procedure: RIGHT UPPER EXTREMITY ARTERIOVENOUS (AV) FISTULA CREATION;  Surgeon: Chuck Hint, MD;  Location: [facility] OR;  Service: Vascular;  Laterality: Right;  . AVGG REMOVAL Left 06/22/2022   Procedure: REMOVAL OF ARTERIOVENOUS GORETEX GRAFT (AVGG) LEFT ARM WITH VEIN PATCH OF BRACHIAL ARTERY;  Surgeon: Chuck Hint, MD;  Location: [facility] OR;  Service: Vascular;  Laterality: Left;  . BASCILIC VEIN TRANSPOSITION Right 11/29/2022   Procedure: RIGHT ARM BASILIC VEIN TRANSPOSITION;  Surgeon: Victorino Sparrow, MD;  Location: [facility] OR;  Service: Vascular;  Laterality: Right;  . CAPD INSERTION N/A 09/10/2020  Procedure: LAPAROSCOPIC INSERTION CONTINUOUS AMBULATORY PERITONEAL DIALYSIS  (CAPD) CATHETER;  Surgeon: Nada Libman, MD;  Location: [facility] OR;  Service: Vascular;  Laterality: N/A;  . CESAREAN SECTION N/A 12/26/2017   Procedure: CESAREAN SECTION;  Surgeon: Tereso Newcomer, MD;  Location: [facility];  Service: Obstetrics;  Laterality: N/A;  . EYE EXAMINATION UNDER ANESTHESIA Right 07/20/2018   Procedure: Eye Exam Under Anesthesia  RIGHT EYE;  Surgeon: Rennis Chris, MD;  Location: [facility] OR;  Service: Ophthalmology;  Laterality: Right;  . EYE SURGERY Left 07/2018  . GAS INSERTION Left 07/19/2019   Procedure: INSERTION OF GAS;  Surgeon: Rennis Chris, MD;  Location: [facility] OR;  Service: Ophthalmology;  Laterality: Left;  SF6  . INJECTION OF SILICONE OIL Left 07/20/2018   Procedure: Injection Of Silicone Oil LEFT EYE;  Surgeon: Rennis Chris, MD;  Location: [facility] OR;  Service: Ophthalmology;  Laterality: Left;  . INSERTION OF DIALYSIS CATHETER Right 07/05/2021   Procedure: REMOVAL OF DIALYSIS CATHETER;  Surgeon: Leonie Douglas, MD;  Location: [facility] OR;  Service: Vascular;  Laterality: Right;  . INSERTION OF DIALYSIS CATHETER Right 07/09/2021   Procedure: INSERTION OF TUNNELED DIALYSIS CATHETER;  Surgeon: Leonie Douglas, MD;  Location: [facility] OR;  Service: Vascular;  Laterality: Right;  . INSERTION OF DIALYSIS CATHETER Left 07/09/2021   Procedure: INSERTION OF TUNNELED  DIALYSIS CATHETER;  Surgeon: Leonie Douglas, MD;  Location: [facility] OR;  Service: Vascular;  Laterality: Left;  . INSERTION OF DIALYSIS CATHETER N/A 06/22/2022   Procedure: INSERTION OF LEFT INTERNAL JUGULAR TRIALYSIS DIALYSIS CATHETER;  Surgeon: Chuck Hint, MD;  Location: [facility] OR;  Service: Vascular;  Laterality: N/A;  . INSERTION OF DIALYSIS CATHETER Left 06/28/2022   Procedure: ULTRASOUND GUIDED INSERTION OF TUNNELED DIALYSIS CATHETER;  Surgeon: Leonie Douglas, MD;  Location: [facility] OR;  Service: Vascular;  Laterality: Left;  . INSERTION OF DIALYSIS CATHETER Left 11/29/2022   Procedure: INSERTION OF TUNNELED DIALYSIS CATHETER TO LEFT FEMORAL VEIN;  Surgeon: Victorino Sparrow, MD;  Location: [facility] OR;  Service: Vascular;  Laterality: Left;  . IR FLUORO GUIDE CV LINE RIGHT  06/02/2020  . IR US GUIDE VASC ACCESS RIGHT  06/02/2020  . LASER PHOTO ABLATION Right 07/20/2018   Procedure: Laser Photo Ablation RIGHT EYE;  Surgeon: Rennis Chris, MD;  Location: [facility] OR;  Service: Ophthalmology;   Laterality: Right;  . MEMBRANE PEEL Left 07/20/2018   Procedure: Membrane Peel LEFT EYE;  Surgeon: Rennis Chris, MD;  Location: [facility] OR;  Service: Ophthalmology;  Laterality: Left;  . MEMBRANE PEEL Left 07/19/2019   Procedure: MEMBRANE PEEL;  Surgeon: Rennis Chris, MD;  Location: [facility] OR;  Service: Ophthalmology;  Laterality: Left;  . MITOMYCIN C APPLICATION Bilateral 07/20/2018   Procedure: Avastin Application;  Surgeon: Rennis Chris, MD;  Location: [facility] OR;  Service: Ophthalmology;  Laterality: Bilateral;  . PHOTOCOAGULATION WITH LASER Left 07/20/2018   Procedure: Photocoagulation With Laser LEFT EYE;  Surgeon: Rennis Chris, MD;  Location: [facility] OR;  Service: Ophthalmology;  Laterality: Left;  . PHOTOCOAGULATION WITH LASER Left 07/19/2019   Procedure: PHOTOCOAGULATION WITH LASER;  Surgeon: Rennis Chris, MD;  Location: [facility] OR;  Service: Ophthalmology;  Laterality: Left;  . RETINAL DETACHMENT SURGERY Left 07/20/2018   Dr. Rennis Chris  . SILICON OIL REMOVAL Left 07/19/2019   Procedure: 25g PARS PLANA VITRECTOMY WITH SILICON OIL REMOVAL;  Surgeon: Rennis Chris, MD;  Location: [facility] OR;  Service: Ophthalmology;  Laterality: Left;  . TEE WITHOUT CARDIOVERSION N/A 07/08/2021   Procedure:  TRANSESOPHAGEAL ECHOCARDIOGRAM (TEE);  Surgeon: Little Ishikawa, MD;  Location: [facility] ENDOSCOPY;  Service: Cardiovascular;  Laterality: N/A;  . TEE WITHOUT CARDIOVERSION N/A 06/29/2022   Procedure: TRANSESOPHAGEAL ECHOCARDIOGRAM;  Surgeon: Wendall Stade, MD;  Location: [facility] INVASIVE CV LAB;  Service: Cardiovascular;  Laterality: N/A;  . WISDOM TOOTH EXTRACTION      Family History  Problem Relation Age of Onset  . Diabetes Mother   . Aneurysm Mother 79  . Seizures Mother   . Diabetes Father   . Cataracts Father   . COPD Father   . Heart attack Father   . Heart disease Father   . Healthy Sister   . Healthy Daughter   . Stroke Maternal Grandfather   . Amblyopia Neg Hx   . Blindness Neg Hx   . Glaucoma Neg Hx   .  Macular degeneration Neg Hx   . Retinal detachment Neg Hx   . Strabismus Neg Hx   . Retinitis pigmentosa Neg Hx   . Colon cancer Neg Hx   . Stomach cancer Neg Hx   . Esophageal cancer Neg Hx   . Pancreatic cancer Neg Hx   . Liver disease Neg Hx     Social History:  reports that she has never smoked. She has never been exposed to tobacco smoke. She has never used smokeless tobacco. She reports that she does not currently use alcohol. She reports that she does not use drugs.  Allergies: No Known Allergies  Medications: I have reviewed the patient's current medications.  Results for orders placed or performed during the hospital encounter of 01/15/23 (from the past 48 hours)  Comprehensive metabolic panel     Status: Abnormal   Collection Time: 01/15/23  8:10 PM  Result Value Ref Range   Sodium 135 135 - 145 mmol/L   Potassium 4.8 3.5 - 5.1 mmol/L   Chloride 89 (L) 98 - 111 mmol/L   CO2 23 22 - 32 mmol/L   Glucose, Bld 167 (H) 70 - 99 mg/dL    Comment: Glucose reference range applies only to samples taken after fasting for at least 8 hours.   BUN 40 (H) 6 - 20 mg/dL   Creatinine, Ser 43.32 (H) 0.44 - 1.00 mg/dL   Calcium 95.1 8.9 - 88.4 mg/dL   Total Protein 8.8 (H) 6.5 - 8.1 g/dL   Albumin 4.6 3.5 - 5.0 g/dL   AST 25 15 - 41 U/L   ALT 18 0 - 44 U/L   Alkaline Phosphatase 191 (H) 38 - 126 U/L   Total Bilirubin 1.1 <1.2 mg/dL   GFR, Estimated 4 (L) >60 mL/min    Comment: (NOTE) Calculated using the CKD-EPI Creatinine Equation (2021)    Anion gap 23 (H) 5 - 15    Comment: ELECTROLYTES REPEATED TO VERIFY Performed at [facility] Lab, [address], [place], [place] [address]   CBC with Differential     Status: Abnormal   Collection Time: 01/15/23  8:10 PM  Result Value Ref Range   WBC 7.7 4.0 - 10.5 K/uL   RBC 4.35 3.87 - 5.11 MIL/uL   Hemoglobin 12.5 12.0 - 15.0 g/dL   HCT 30.1 60.1 - 09.3 %   MCV 93.1 80.0 - 100.0 fL   MCH 28.7 26.0 - 34.0 pg   MCHC 30.9 30.0 -  36.0 g/dL   RDW 23.5 (H) 57.3 - 22.0 %   Platelets 161 150 - 400 K/uL    Comment: REPEATED TO VERIFY  nRBC 0.4 (H) 0.0 - 0.2 %   Neutrophils Relative % 83 %   Neutro Abs 6.4 1.7 - 7.7 K/uL   Lymphocytes Relative 9 %   Lymphs Abs 0.7 0.7 - 4.0 K/uL   Monocytes Relative 6 %   Monocytes Absolute 0.5 0.1 - 1.0 K/uL   Eosinophils Relative 0 %   Eosinophils Absolute 0.0 0.0 - 0.5 K/uL   Basophils Relative 1 %   Basophils Absolute 0.0 0.0 - 0.1 K/uL   Immature Granulocytes 1 %   Abs Immature Granulocytes 0.04 0.00 - 0.07 K/uL    Comment: Performed at [facility], [address] [place], [place] [address]  Lipase, blood     Status: None   Collection Time: 01/15/23  8:10 PM  Result Value Ref Range   Lipase 39 11 - 51 U/L    Comment: Performed at [facility], [address] [place], [place] [address]  hCG, serum, qualitative     Status: None   Collection Time: 01/15/23  8:10 PM  Result Value Ref Range   Preg, Serum NEGATIVE NEGATIVE    Comment:        THE SENSITIVITY OF THIS METHODOLOGY IS >10 mIU/mL. Performed at [facility], [address] [place], [place] [address]     CT ABDOMEN PELVIS WO CONTRAST Result Date: 01/15/2023 CLINICAL DATA:  Acute onset abdominal pain with nausea and vomiting EXAM: CT ABDOMEN AND PELVIS WITHOUT CONTRAST TECHNIQUE: Multidetector CT imaging of the abdomen and pelvis was performed following the standard protocol without IV contrast. RADIATION DOSE REDUCTION: This exam was performed according to the departmental dose-optimization program which includes automated exposure control, adjustment of the mA and/or kV according to patient size and/or use of iterative reconstruction technique. COMPARISON:  CT abdomen and pelvis dated 07/03/2021 FINDINGS: Lower chest: Bibasilar punctate calcifications again seen. No pleural effusion or pneumothorax demonstrated. Partially imaged heart size is normal. Hepatobiliary: No focal hepatic lesions. No  intra or extrahepatic biliary ductal dilation. Normal gallbladder. Pancreas: No focal lesions or main ductal dilation. Spleen: Normal in size without focal abnormality. Adrenals/Urinary Tract: No adrenal nodules. No suspicious renal mass on this noncontrast enhanced examination, calculi or hydronephrosis. Bladder is decompressed. Stomach/Bowel: Normal appearance of the stomach. No evidence of bowel wall thickening, distention, or inflammatory changes. Small volume stool within the ascending and transverse colon. Mildly dilated retrocecal appendix measures up to 11 mm (4:52), previously 6 mm on 07/03/2021. Vascular/Lymphatic: Vascular catheter reaches the right atrium. Aortic atherosclerosis. No enlarged abdominal or pelvic lymph nodes. Reproductive: No adnexal masses. Other: Minimal periappendiceal stranding. No free air or fluid collection. Musculoskeletal: No acute or abnormal lytic or blastic osseous lesions. IMPRESSION: 1. Mildly dilated retrocecal appendix measures up to 11 mm, previously 6 mm on 07/03/2021. Minimal periappendiceal stranding. Findings may reflect early acute appendicitis. 2.  Aortic Atherosclerosis (ICD10-I70.0). Electronically Signed   By: Agustin Cree M.D.   On: 01/15/2023 21:36    ROS 10 point review of systems is negative except as listed above in HPI.   Physical Exam Blood pressure (!) 157/97, pulse 91, temperature 98.5 F (36.9 C), temperature source Oral, resp. rate 16, height 5\' 6"  (1.676 m), weight 66.5 kg, SpO2 100%. Constitutional: well-developed, well-nourished HEENT: pupils equal, round, reactive to light, 2mm b/l, moist conjunctiva, external inspection of ears and nose normal, hearing intact Oropharynx: normal oropharyngeal mucosa, normal dentition Neck: no thyromegaly, trachea midline, no midline cervical tenderness to palpation Chest: breath sounds equal  bilaterally, normal respiratory effort, no midline or lateral chest wall tenderness to palpation/deformity Abdomen:  soft, RLQ TTP, no bruising, no hepatosplenomegaly Extremities: 2+ radial and pedal pulses bilaterally, intact motor and sensation bilateral UE and LE, no peripheral edema, L thigh HD cath, R upper arm fistula with good thrill Skin: warm, dry, no rashes Psych: normal memory, normal mood/affect     Assessment/Plan: ***  *** -  FEN - {diet:29922} DVT - SCDs, {Blank single:19197::"LMWH","LMWH 40BID","SQH","hold chemical ppx due to bleeding concerns"} Dispo - {Blank single:19197::"ICU","4NP","med-surg","***"}    Diamantina Monks, MD General and Trauma Surgery [facility] Surgery

## 2023-01-15 NOTE — ED Notes (Addendum)
Plan of care explained to patient.

## 2023-01-15 NOTE — H&P (Cosign Needed)
Date: 01/15/2023               Patient Name:  Barbara Donovan MRN: [id number]  DOB: 29-Aug-1990 Age / Sex: 32 y.o., female   PCP: Faith Rogue, DO         Medical Service: Internal Medicine Teaching Service         Attending Physician: Dr. Mayford Knife, Dorene Ar, MD      First Contact: Dr. [name], DO Pager [phone]    Second Contact: Dr. Rana Snare, DO Pager [phone]         After Hours (After 5p/  First Contact Pager: [phone]  weekends / holidays): Second Contact Pager: [phone]   SUBJECTIVE   Chief Complaint: Abdominal pain  History of Present Illness: Barbara Donovan is a pleasant 32 year old female with pertinent past medical history of ESRD on HD MTTF, type 2 diabetes, asthma, and hypertension who is presenting with 1 day history of acute onset abdominal pain.  She states the pain started this morning around 0700, and was diffuse in the lower quadrant.  The pain then intensified and migrated to the right lower quadrant.  She reports associated nausea and vomiting of nonbilious material.  Last dialysis session yesterday.  Denies hematemesis, hematuria, hematochezia, dysuria, constipation, diarrhea, fever, chills, chest pain, shortness of breath, or rash.  She states she has been unable to take any of her medications today.  She receives dialysis through a left femoral tunneled dialysis catheter.  She has a right AV fistula with recent procedure by vascular surgery.  They attempted to use this 2 weeks ago, but did not have success per patient.    Medications: Calcitriol 0.5 mcg M, W, and F w/ HD  Hydralazine 100 mg TID  Carvedilol 25 mg BID  Sensipar 30 mg daily  Amlodipine 10 mg daily  Tresiba 18 units in afternoon  Humulog 6 units TID with meals  Velphoro 500 mg TID Losartan 100 mg daily  Mirtazpine 15 mg daily   ED Course: Brought in by EMS, given 4 mg Zofran IM with minimal relief per EMS.  At that time hypertensive to 105/129.  Noncontrast CT concerning for early  acute appendicitis.  Started on ceftriaxone and Flagyl.  Given 10 mg metoclopramide and 1 mg IV Dilaudid.  CMP with hyperchloremia to 89, glucose 167, uremic to 40, elevated creatinine and setting of known ESRD.  Anion gap of 23.  Alk phos elevated to 191.  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  Increased RDW, resolved anemia from 3 weeks ago.  No leukocytosis.  May reflect some hemoconcentration.  Lipase negative.  hCG negative.  Surgery consulted, recommended medicine admission d/t significant comorbidity.  Meds:  No outpatient medications have been marked as taking for the 01/15/23 encounter [facility] Encounter).    Past Medical History  Past Surgical History:  Procedure Laterality Date   25 GAUGE PARS PLANA VITRECTOMY WITH 20 GAUGE MVR PORT FOR MACULAR HOLE Left 07/20/2018   Procedure: 25 GAUGE PARS PLANA VITRECTOMY LEFT EYE;  Surgeon: Rennis Chris, MD;  Location: [facility] OR;  Service: Ophthalmology;  Laterality: Left;   AV FISTULA PLACEMENT Left 08/11/2021   Procedure: INSERTION OF LEFT ARM ARTERIOVENOUS (AV) GORE-TEX GRAFT;  Surgeon: Victorino Sparrow, MD;  Location: [facility] OR;  Service: Vascular;  Laterality: Left;  PERIPHERAL NERVE BLOCK   AV FISTULA PLACEMENT Right 08/24/2022   Procedure: RIGHT UPPER EXTREMITY ARTERIOVENOUS (AV) FISTULA CREATION;  Surgeon: Chuck Hint, MD;  Location: [facility] OR;  Service: Vascular;  Laterality: Right;   AVGG REMOVAL Left 06/22/2022   Procedure: REMOVAL OF ARTERIOVENOUS GORETEX GRAFT (AVGG) LEFT ARM WITH VEIN PATCH OF BRACHIAL ARTERY;  Surgeon: Chuck Hint, MD;  Location: [facility] OR;  Service: Vascular;  Laterality: Left;   BASCILIC VEIN TRANSPOSITION Right 11/29/2022   Procedure: RIGHT ARM BASILIC VEIN TRANSPOSITION;  Surgeon: Victorino Sparrow, MD;  Location: [facility] OR;  Service: Vascular;  Laterality: Right;   CAPD INSERTION N/A 09/10/2020   Procedure: LAPAROSCOPIC INSERTION CONTINUOUS AMBULATORY PERITONEAL DIALYSIS  (CAPD) CATHETER;  Surgeon:  Nada Libman, MD;  Location: [facility] OR;  Service: Vascular;  Laterality: N/A;   CESAREAN SECTION N/A 12/26/2017   Procedure: CESAREAN SECTION;  Surgeon: Tereso Newcomer, MD;  Location: [facility];  Service: Obstetrics;  Laterality: N/A;   EYE EXAMINATION UNDER ANESTHESIA Right 07/20/2018   Procedure: Eye Exam Under Anesthesia RIGHT EYE;  Surgeon: Rennis Chris, MD;  Location: [facility] OR;  Service: Ophthalmology;  Laterality: Right;   EYE SURGERY Left 07/2018   GAS INSERTION Left 07/19/2019   Procedure: INSERTION OF GAS;  Surgeon: Rennis Chris, MD;  Location: [facility] OR;  Service: Ophthalmology;  Laterality: Left;  SF6   INJECTION OF SILICONE OIL Left 07/20/2018   Procedure: Injection Of Silicone Oil LEFT EYE;  Surgeon: Rennis Chris, MD;  Location: [facility] OR;  Service: Ophthalmology;  Laterality: Left;   INSERTION OF DIALYSIS CATHETER Right 07/05/2021   Procedure: REMOVAL OF DIALYSIS CATHETER;  Surgeon: Leonie Douglas, MD;  Location: [facility] OR;  Service: Vascular;  Laterality: Right;   INSERTION OF DIALYSIS CATHETER Right 07/09/2021   Procedure: INSERTION OF TUNNELED DIALYSIS CATHETER;  Surgeon: Leonie Douglas, MD;  Location: [facility] OR;  Service: Vascular;  Laterality: Right;   INSERTION OF DIALYSIS CATHETER Left 07/09/2021   Procedure: INSERTION OF TUNNELED  DIALYSIS CATHETER;  Surgeon: Leonie Douglas, MD;  Location: [facility] OR;  Service: Vascular;  Laterality: Left;   INSERTION OF DIALYSIS CATHETER N/A 06/22/2022   Procedure: INSERTION OF LEFT INTERNAL JUGULAR TRIALYSIS DIALYSIS CATHETER;  Surgeon: Chuck Hint, MD;  Location: [facility] OR;  Service: Vascular;  Laterality: N/A;   INSERTION OF DIALYSIS CATHETER Left 06/28/2022   Procedure: ULTRASOUND GUIDED INSERTION OF TUNNELED DIALYSIS CATHETER;  Surgeon: Leonie Douglas, MD;  Location: [facility] OR;  Service: Vascular;  Laterality: Left;   INSERTION OF DIALYSIS CATHETER Left 11/29/2022   Procedure: INSERTION OF TUNNELED DIALYSIS CATHETER TO LEFT FEMORAL VEIN;   Surgeon: Victorino Sparrow, MD;  Location: [facility] OR;  Service: Vascular;  Laterality: Left;   IR FLUORO GUIDE CV LINE RIGHT  06/02/2020   IR US GUIDE VASC ACCESS RIGHT  06/02/2020   LASER PHOTO ABLATION Right 07/20/2018   Procedure: Laser Photo Ablation RIGHT EYE;  Surgeon: Rennis Chris, MD;  Location: [facility] OR;  Service: Ophthalmology;  Laterality: Right;   MEMBRANE PEEL Left 07/20/2018   Procedure: Membrane Peel LEFT EYE;  Surgeon: Rennis Chris, MD;  Location: [facility] OR;  Service: Ophthalmology;  Laterality: Left;   MEMBRANE PEEL Left 07/19/2019   Procedure: MEMBRANE PEEL;  Surgeon: Rennis Chris, MD;  Location: [facility] OR;  Service: Ophthalmology;  Laterality: Left;   MITOMYCIN C APPLICATION Bilateral 07/20/2018   Procedure: Avastin Application;  Surgeon: Rennis Chris, MD;  Location: [facility] OR;  Service: Ophthalmology;  Laterality: Bilateral;   PHOTOCOAGULATION WITH LASER Left 07/20/2018   Procedure: Photocoagulation With Laser LEFT EYE;  Surgeon: Rennis Chris, MD;  Location: [facility] OR;  Service: Ophthalmology;  Laterality: Left;  PHOTOCOAGULATION WITH LASER Left 07/19/2019   Procedure: PHOTOCOAGULATION WITH LASER;  Surgeon: Rennis Chris, MD;  Location: [facility] OR;  Service: Ophthalmology;  Laterality: Left;   RETINAL DETACHMENT SURGERY Left 07/20/2018   Dr. Rennis Chris   SILICON OIL REMOVAL Left 07/19/2019   Procedure: 25g PARS PLANA VITRECTOMY WITH SILICON OIL REMOVAL;  Surgeon: Rennis Chris, MD;  Location: [facility] OR;  Service: Ophthalmology;  Laterality: Left;   TEE WITHOUT CARDIOVERSION N/A 07/08/2021   Procedure: TRANSESOPHAGEAL ECHOCARDIOGRAM (TEE);  Surgeon: Little Ishikawa, MD;  Location: [facility] ENDOSCOPY;  Service: Cardiovascular;  Laterality: N/A;   TEE WITHOUT CARDIOVERSION N/A 06/29/2022   Procedure: TRANSESOPHAGEAL ECHOCARDIOGRAM;  Surgeon: Wendall Stade, MD;  Location: [facility] INVASIVE CV LAB;  Service: Cardiovascular;  Laterality: N/A;   WISDOM TOOTH EXTRACTION      Social:  Lives With: Boyfriend and son at home   Occupation: Disabled Level of Function: Independent PCP: Faith Rogue, DO Substances: None  Allergies: Allergies as of 01/15/2023   (No Known Allergies)    OBJECTIVE:   Physical Exam: Blood pressure (!) 157/97, pulse 91, temperature 98.5 F (36.9 C), temperature source Oral, resp. rate 16, height 5\' 6"  (1.676 m), weight 66.5 kg, SpO2 100%.  Constitutional: Chronically ill appearing, in no acute distress Cardiovascular: regular rate and rhythm, no m/r/g.  Right AV fistula of the upper extremity, strong thrill and bruit. Pulmonary/Chest: normal work of breathing on room air, lungs clear to auscultation bilaterally Abdominal: Soft nondistended.  Pain with deep palpation of the right lower quadrant.  No rebound or guarding.  Negative Rovsing sign.  Negative psoas sign. Skin: warm and dry. Extremities: No lower extremity edema bilaterally.  Left femoral tunneled dialysis catheter in place. Psych: Pleasant affect, linear speech.  Labs: CBC    Component Value Date/Time   WBC 7.7 01/15/2023 2010   RBC 4.35 01/15/2023 2010   HGB 12.5 01/15/2023 2010   HGB 8.8 (L) 12/21/2022 1622   HGB 13.0 07/07/2017 0000   HCT 40.5 01/15/2023 2010   HCT 27.1 (L) 12/21/2022 1622   HCT 39 07/07/2017 0000   PLT 161 01/15/2023 2010   PLT 245 12/21/2022 1622   PLT 374 07/07/2017 0000   MCV 93.1 01/15/2023 2010   MCV 94 12/21/2022 1622   MCH 28.7 01/15/2023 2010   MCHC 30.9 01/15/2023 2010   RDW 18.3 (H) 01/15/2023 2010   RDW 16.0 (H) 12/21/2022 1622   LYMPHSABS 0.7 01/15/2023 2010   MONOABS 0.5 01/15/2023 2010   EOSABS 0.0 01/15/2023 2010   BASOSABS 0.0 01/15/2023 2010     CMP     Component Value Date/Time   NA 135 01/15/2023 2010   NA 142 12/21/2022 1622   K 4.8 01/15/2023 2010   CL 89 (L) 01/15/2023 2010   CO2 23 01/15/2023 2010   GLUCOSE 167 (H) 01/15/2023 2010   BUN 40 (H) 01/15/2023 2010   BUN 28 (H) 12/21/2022 1622   CREATININE 11.13 (H) 01/15/2023 2010   CALCIUM 10.0  01/15/2023 2010   PROT 8.8 (H) 01/15/2023 2010   PROT 4.5 (L) 12/22/2017 1459   ALBUMIN 4.6 01/15/2023 2010   ALBUMIN 2.2 (L) 12/22/2017 1459   AST 25 01/15/2023 2010   ALT 18 01/15/2023 2010   ALKPHOS 191 (H) 01/15/2023 2010   BILITOT 1.1 01/15/2023 2010   BILITOT <0.2 12/22/2017 1459   GFRNONAA 4 (L) 01/15/2023 2010   GFRAA 30 (L) 09/15/2019 0358    Imaging: IMPRESSION: 1. Mildly dilated retrocecal appendix measures up  to 11 mm, previously 6 mm on 07/03/2021. Minimal periappendiceal stranding. Findings may reflect early acute appendicitis. 2.  Aortic Atherosclerosis (ICD10-I70.0).   EKG: personally reviewed my interpretation is no acute pathology. Prior EKG unchanged.  QTc 504.  ASSESSMENT & PLAN:   Assessment & Plan by Problem: Principal Problem:   Acute appendicitis Active Problems:   Essential hypertension   Type 1 diabetes mellitus with hyperglycemia (HCC)   ESRD on hemodialysis (HCC)   Barbara Donovan is a 32 y.o. person living with a history of ESRD on HD, type 1 diabetes, and hypertension who presented with abdominal pain and admitted for likely early acute appendicitis on hospital day 0  #Abdominal pain #Suspected acute appendicitis #Nausea vomiting Patient presenting with signs and symptoms concerning for possible acute appendicitis.  Surgery consulted, appreciate their input.  No signs of peritonitis, appendiceal rupture, psoas abscess at this time.  Pain well-controlled at this time after 1 mg IV Dilaudid in ED.  No hematemesis or bilious emesis at this time. No leukocytosis at this time, denies fever or chills. Doubt SBO. No diarrhea to suggest colitis. Plan: - IV ceftriaxone, metronidazole - Surgery consult - As needed Tylenol 1 mg 3 times daily - IV Dilaudid, p.o. oxycodone as needed for pain. - Status post Zofran, Reglan.  Tigan as needed in setting of QTc prolongation to 504 - Daily CBC  #ESRD #Uremia #Gap acidosis Patient receives HD 4 times  weekly through femoral tunneled dialysis catheter on the left lower extremity.  Last dialysis session yesterday.  Patient is mildly uremic with a gap acidosis, but no signs of encephalopathy or bleeding. Plan: - Consult nephrology in a.m. for dialysis needs. - Trend CMP -Continue home Calcitrol, Cinacalcet, Velphoro   #Type 1 diabetes  Last A1c 6.2 on 09/23/2022.  Home medications include 18 units Tresiba nightly, 6 units Humalog with meals. Plan: - Sliding scale insulin - Currently n.p.o. pending possible surgery.  #Hypertension Patient unable to keep down their home blood pressure medications today in setting of vomiting.  Hypertensive to the 180s during patient interview.  Asymptomatic at this time. Plan: - Resume hydralazine 100 mg 3 times daily, amlodipine 10 mg daily, Coreg 25 mg twice daily. Hold ARB.   #Alk Phos Elevation Unclear etiology, no right upper quadrant pain at this time.  May reflect renal osteodystrophy. Plan: - Trend CMP - Consider right upper quadrant ultrasound  Diet: NPO VTE: SCDs IVF: None,None Code: Full  Prior to Admission Living Arrangement: Home, living independently Anticipated Discharge Location: Home  Dispo: Admit patient to Inpatient with expected length of stay greater than 2 midnights.  Signed: Lovie Macadamia, MD Internal Medicine Resident PGY-1  01/15/2023, 11:30 PM

## 2023-01-15 NOTE — ED Triage Notes (Signed)
Patient BIB [facility] from home for N/V and abdominal pain starting this afternoon. Patient describes pain as cramping and has had many episodes of vomiting. EMS gave 4mg  zofran IM with minimal relief. Patient is hypertensive, 205/129, HR 95, 100% RA, CBG 188, RR 20.Patient has not missed any dialysis treatments.

## 2023-01-15 NOTE — ED Provider Notes (Signed)
[place] [facility] DEPARTMENT AT [facility] Provider Note   CSN: [id number] Arrival date & time: 01/15/23  1934     History  Chief Complaint  Patient presents with   Nausea    Barbara Donovan is a 32 y.o. female.  HPI 32 year old female with a history of type 1 diabetes, ESRD on dialysis, hypertension and other comorbidities presents with abdominal pain and vomiting.  Symptoms started this morning.  No hematemesis.  No constipation, diarrhea, chest pain, shortness of breath.  No fevers.  Pain is primarily lower abdominal. Last dialysis was yesterday.  She urinates once in the morning per day, but has not noticed any dysuria.  Home Medications Prior to Admission medications   Medication Sig Start Date End Date Taking? Authorizing Provider  acetaminophen (TYLENOL) 325 MG tablet Take 650 mg by mouth every 6 (six) hours as needed for mild pain.    [provider]  amLODipine (NORVASC) 10 MG tablet Take 1 tablet (10 mg total) by mouth daily. Patient taking differently: Take 10 mg by mouth every evening. 03/09/20   Almon Hercules, MD  calcitRIOL (ROCALTROL) 0.5 MCG capsule Take 0.5 mcg by mouth every Monday, Wednesday, and Friday with hemodialysis. 12/02/20   [provider]  carvedilol (COREG) 25 MG tablet Take 1 tablet (25 mg total) by mouth 2 (two) times daily. 03/07/19   Dyann Kief, PA-C  Continuous Blood Gluc Receiver DEVI 1 Units by Does not apply route 4 (four) times daily -  before meals and at bedtime. May substitute for cheapest monitor with insurance 03/27/21   Ilene Qua, MD  Continuous Blood Gluc Sensor (DEXCOM G7 SENSOR) MISC 1 Device by Does not apply route as directed. 10/13/21   Shamleffer, Konrad Dolores, MD  ethyl chloride spray Apply 1 Application topically every Monday, Wednesday, and Friday with hemodialysis. Apply to arm 05/09/22   [provider]  glucose blood (FREESTYLE TEST STRIPS) test strip Use as instructed 02/25/21    Lorin Glass, MD  hydrALAZINE (APRESOLINE) 50 MG tablet Take 2 tablets (100 mg total) by mouth 3 (three) times daily. 12/21/22   Katheran James, DO  HYDROcodone-acetaminophen (NORCO/VICODIN) 5-325 MG tablet Take 1 tablet by mouth every 6 (six) hours as needed for moderate pain (pain score 4-6). 11/29/22   Emilie Rutter, PA-C  insulin degludec (TRESIBA FLEXTOUCH) 200 UNIT/ML FlexTouch Pen Inject 18 Units into the skin daily in the afternoon. 11/02/22   Shamleffer, Konrad Dolores, MD  insulin lispro (HUMALOG KWIKPEN) 100 UNIT/ML KwikPen Max daily 40 units Patient taking differently: Inject 6 Units into the skin 3 (three) times daily with meals. Max daily 40 units 08/26/22   Shamleffer, Konrad Dolores, MD  Insulin Pen Needle 32G X 4 MM MISC 1 Device by Does not apply route in the morning, at noon, in the evening, and at bedtime. 08/26/22   Shamleffer, Konrad Dolores, MD  losartan (COZAAR) 100 MG tablet Take 100 mg by mouth at bedtime. 08/06/22   [provider]  metoCLOPramide (REGLAN) 10 MG tablet Take 1 tablet (10 mg total) by mouth every 6 (six) hours. Patient taking differently: Take 10 mg by mouth every 6 (six) hours as needed for nausea or vomiting. 12/05/21   Edwin Dada P, DO  mirtazapine (REMERON) 15 MG tablet Take 1 tablet (15 mg total) by mouth at bedtime. Do not take medicine in the morning and drive. 10/05/22 10/05/23  Faith Rogue, DO  multivitamin (RENA-VIT) TABS tablet Take 1 tablet by mouth at  bedtime. 03/08/20   Almon Hercules, MD  ondansetron (ZOFRAN-ODT) 8 MG disintegrating tablet Take 1 tablet (8 mg total) by mouth every 8 (eight) hours as needed for nausea or vomiting. 07/31/21   Dione Booze, MD  SENSIPAR 30 MG tablet Take 30 mg by mouth daily. 04/29/22   [provider]  sucroferric oxyhydroxide (VELPHORO) 500 MG chewable tablet Chew 1,500 mg by mouth 3 (three) times daily with meals.    [provider]      Allergies    Patient has no known  allergies.    Review of Systems   Review of Systems  Constitutional:  Negative for fever.  Respiratory:  Negative for shortness of breath.   Cardiovascular:  Negative for chest pain.  Gastrointestinal:  Positive for abdominal pain, nausea and vomiting.    Physical Exam Updated Vital Signs BP (!) 187/104   Pulse 90   Temp 98.5 F (36.9 C) (Oral)   Resp 14   Ht 5\' 6"  (1.676 m)   Wt 66.5 kg   SpO2 100%   BMI 23.66 kg/m  Physical Exam Vitals and nursing note reviewed.  Constitutional:      Appearance: She is well-developed.  HENT:     Head: Normocephalic and atraumatic.  Cardiovascular:     Rate and Rhythm: Normal rate and regular rhythm.     Heart sounds: Normal heart sounds.  Pulmonary:     Effort: Pulmonary effort is normal.     Breath sounds: Normal breath sounds.  Abdominal:     Palpations: Abdomen is soft.     Tenderness: There is abdominal tenderness in the right lower quadrant.  Skin:    General: Skin is warm and dry.  Neurological:     Mental Status: She is alert.     ED Results / Procedures / Treatments   Labs (all labs ordered are listed, but only abnormal results are displayed) Labs Reviewed  COMPREHENSIVE METABOLIC PANEL - Abnormal; Notable for the following components:      Result Value   Chloride 89 (*)    Glucose, Bld 167 (*)    BUN 40 (*)    Creatinine, Ser 11.13 (*)    Total Protein 8.8 (*)    Alkaline Phosphatase 191 (*)    GFR, Estimated 4 (*)    Anion gap 23 (*)    All other components within normal limits  CBC WITH DIFFERENTIAL/PLATELET - Abnormal; Notable for the following components:   RDW 18.3 (*)    nRBC 0.4 (*)    All other components within normal limits  LIPASE, BLOOD  HCG, SERUM, QUALITATIVE  CBC  RENAL FUNCTION PANEL    EKG EKG Interpretation Date/Time:  Saturday January 15 2023 20:26:00 EST Ventricular Rate:  82 PR Interval:  180 QRS Duration:  95 QT Interval:  431 QTC Calculation: 504 R Axis:   63  Text  Interpretation: Sinus rhythm Probable left atrial enlargement Prolonged QT interval Confirmed by Pricilla Loveless [id number]) on 01/15/2023 8:36:47 PM  Radiology CT ABDOMEN PELVIS WO CONTRAST Result Date: 01/15/2023 CLINICAL DATA:  Acute onset abdominal pain with nausea and vomiting EXAM: CT ABDOMEN AND PELVIS WITHOUT CONTRAST TECHNIQUE: Multidetector CT imaging of the abdomen and pelvis was performed following the standard protocol without IV contrast. RADIATION DOSE REDUCTION: This exam was performed according to the departmental dose-optimization program which includes automated exposure control, adjustment of the mA and/or kV according to patient size and/or use of iterative reconstruction technique. COMPARISON:  CT abdomen and  pelvis dated 07/03/2021 FINDINGS: Lower chest: Bibasilar punctate calcifications again seen. No pleural effusion or pneumothorax demonstrated. Partially imaged heart size is normal. Hepatobiliary: No focal hepatic lesions. No intra or extrahepatic biliary ductal dilation. Normal gallbladder. Pancreas: No focal lesions or main ductal dilation. Spleen: Normal in size without focal abnormality. Adrenals/Urinary Tract: No adrenal nodules. No suspicious renal mass on this noncontrast enhanced examination, calculi or hydronephrosis. Bladder is decompressed. Stomach/Bowel: Normal appearance of the stomach. No evidence of bowel wall thickening, distention, or inflammatory changes. Small volume stool within the ascending and transverse colon. Mildly dilated retrocecal appendix measures up to 11 mm (4:52), previously 6 mm on 07/03/2021. Vascular/Lymphatic: Vascular catheter reaches the right atrium. Aortic atherosclerosis. No enlarged abdominal or pelvic lymph nodes. Reproductive: No adnexal masses. Other: Minimal periappendiceal stranding. No free air or fluid collection. Musculoskeletal: No acute or abnormal lytic or blastic osseous lesions. IMPRESSION: 1. Mildly dilated retrocecal appendix  measures up to 11 mm, previously 6 mm on 07/03/2021. Minimal periappendiceal stranding. Findings may reflect early acute appendicitis. 2.  Aortic Atherosclerosis (ICD10-I70.0). Electronically Signed   By: Agustin Cree M.D.   On: 01/15/2023 21:36    Procedures Procedures    Medications Ordered in ED Medications  acetaminophen (TYLENOL) tablet 650 mg (has no administration in time range)    Or  acetaminophen (TYLENOL) suppository 650 mg (has no administration in time range)  HYDROmorphone (DILAUDID) injection 0.5-1 mg (has no administration in time range)  oxyCODONE (Oxy IR/ROXICODONE) immediate release tablet 5 mg (has no administration in time range)  polyethylene glycol (MIRALAX / GLYCOLAX) packet 17 g (has no administration in time range)  trimethobenzamide (TIGAN) injection 200 mg (has no administration in time range)  cefTRIAXone (ROCEPHIN) 2 g in sodium chloride 0.9 % 100 mL IVPB (has no administration in time range)  metroNIDAZOLE (FLAGYL) IVPB 500 mg (has no administration in time range)  insulin aspart (novoLOG) injection 0-9 Units (has no administration in time range)  insulin aspart (novoLOG) injection 0-5 Units (has no administration in time range)  amLODipine (NORVASC) tablet 10 mg (has no administration in time range)  carvedilol (COREG) tablet 25 mg (has no administration in time range)  calcitRIOL (ROCALTROL) capsule 0.5 mcg (has no administration in time range)  hydrALAZINE (APRESOLINE) tablet 100 mg (has no administration in time range)  mirtazapine (REMERON) tablet 15 mg (has no administration in time range)  cinacalcet (SENSIPAR) tablet 30 mg (has no administration in time range)  sucroferric oxyhydroxide (VELPHORO) chewable tablet 1,500 mg (has no administration in time range)  HYDROmorphone (DILAUDID) injection 1 mg (1 mg Intravenous Given 01/15/23 2018)  metoCLOPramide (REGLAN) injection 10 mg (10 mg Intravenous Given 01/15/23 2018)  cefTRIAXone (ROCEPHIN) 2 g in  sodium chloride 0.9 % 100 mL IVPB (0 g Intravenous Stopped 01/15/23 2254)    And  metroNIDAZOLE (FLAGYL) IVPB 500 mg (0 mg Intravenous Stopped 01/15/23 2259)    ED Course/ Medical Decision Making/ A&P                                 Medical Decision Making Amount and/or Complexity of Data Reviewed Labs: ordered.    Details: Normal WBC Radiology: ordered and independent interpretation performed.    Details: Acute appendicitis.  Risk Prescription drug management. Decision regarding hospitalization.   Patient was given hydromorphone and  metoclopramide and is feeling a lot better.  However continues to have right lower quadrant tenderness and CT is  concerning for acute appendicitis.  Discussed with Dr. Bedelia Person, she will see in consultation but request medical admission given patient's multiple comorbidities including ESRD on dialysis.  Internal medicine teaching service will admit.  Started on Rocephin and Flagyl.  Kept NPO.        Final Clinical Impression(s) / ED Diagnoses Final diagnoses:  Acute appendicitis, unspecified acute appendicitis type    Rx / DC Orders ED Discharge Orders     None         Pricilla Loveless, MD 01/15/23 2311

## 2023-01-15 NOTE — Consult Note (Signed)
Reason for Consult/Chief Complaint: acute appendicitis Consultant: Criss Alvine, MD  Barbara Donovan is an 32 y.o. female.   HPI: 49F with acute onset of crampy abdominal pain diffusely across lower abdomen. Unrelieved with bowel movement, symptoms progressed to nausea and vomiting over the course of the day.  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 Last HD session 12/27, still makes small volume urine once daily, undergoing eval for KTxp ([facility]).   Prior CS in 2019.    Past Medical History:  Diagnosis Date   Anxiety    Asthma    as a child, no problems as an adult, no inhaler   Cataract    NS OU   Chronic hypertension during pregnancy, antepartum 08/19/2017   Dehydration 01/28/2018   Depression    Depression during pregnancy, antepartum 07/07/2017   6/20: Short trial of zoloft previously, reports didn't help much but also didn't give it a chance Discussed r/b/a SSRIs in pregnancy, agrees to try Zoloft again, rx sent No SI/HI/red flags   Diabetes (HCC)    TYPE I. 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 A2/B GDM)  Required Referrals for A1GDM or A2GDM: [x]  Diabetes Education and Testing Supplies [x]  Nutrition Cousult  For A2/B GDM or higher classes of DM [x]  Diabetes Education  and Testing Supplies [x]  Nutrition Counsult [x]  Fetal ECHO after 20 weeks  [x]  Eye exam for retina evaluation - severe retinopathy 7/19  Base   Ventricular septal defect (VSD) of fetus in singleton pregnancy, antepartum 09/30/2017   May go to newborn nursery per Dr. Dodson Bing Echo prior to discharge    Past Surgical History:  Procedure Laterality Date   25 GAUGE PARS PLANA VITRECTOMY WITH 20 GAUGE MVR PORT FOR MACULAR HOLE Left 07/20/2018   Procedure: 25 GAUGE PARS PLANA VITRECTOMY LEFT EYE;  Surgeon: Rennis Chris, MD;  Location: [facility] OR;  Service: Ophthalmology;  Laterality: Left;   AV FISTULA PLACEMENT Left 08/11/2021   Procedure: INSERTION OF LEFT ARM ARTERIOVENOUS (AV) GORE-TEX GRAFT;  Surgeon: Victorino Sparrow, MD;  Location: [facility] OR;  Service: Vascular;  Laterality: Left;  PERIPHERAL NERVE BLOCK   AV FISTULA PLACEMENT Right 08/24/2022   Procedure: RIGHT UPPER EXTREMITY ARTERIOVENOUS (AV) FISTULA CREATION;  Surgeon: Chuck Hint, MD;  Location: [facility] OR;  Service: Vascular;  Laterality: Right;   AVGG REMOVAL Left 06/22/2022   Procedure: REMOVAL OF ARTERIOVENOUS GORETEX GRAFT (AVGG) LEFT ARM WITH VEIN PATCH OF BRACHIAL ARTERY;  Surgeon: Chuck Hint, MD;  Location: [facility] OR;  Service: Vascular;  Laterality: Left;   BASCILIC VEIN TRANSPOSITION Right 11/29/2022   Procedure: RIGHT ARM BASILIC VEIN TRANSPOSITION;  Surgeon: Victorino Sparrow, MD;  Location: [facility] OR;  Service: Vascular;  Laterality: Right;   CAPD INSERTION N/A 09/10/2020  Procedure: LAPAROSCOPIC INSERTION CONTINUOUS AMBULATORY PERITONEAL DIALYSIS  (CAPD) CATHETER;  Surgeon: Nada Libman, MD;  Location: [facility] OR;  Service: Vascular;  Laterality: N/A;   CESAREAN SECTION N/A 12/26/2017   Procedure: CESAREAN SECTION;  Surgeon: Tereso Newcomer, MD;  Location: [facility];  Service: Obstetrics;  Laterality: N/A;   EYE EXAMINATION UNDER ANESTHESIA Right 07/20/2018   Procedure: Eye Exam Under Anesthesia RIGHT EYE;  Surgeon: Rennis Chris, MD;  Location: [facility] OR;  Service: Ophthalmology;  Laterality: Right;   EYE SURGERY Left 07/2018   GAS INSERTION Left 07/19/2019   Procedure: INSERTION OF GAS;  Surgeon: Rennis Chris, MD;  Location: [facility] OR;  Service: Ophthalmology;  Laterality: Left;  SF6   INJECTION OF SILICONE OIL Left 07/20/2018   Procedure: Injection Of Silicone Oil LEFT EYE;  Surgeon: Rennis Chris, MD;  Location: [facility] OR;  Service: Ophthalmology;  Laterality: Left;   INSERTION OF DIALYSIS CATHETER Right 07/05/2021   Procedure: REMOVAL OF DIALYSIS CATHETER;  Surgeon: Leonie Douglas, MD;  Location: [facility] OR;  Service: Vascular;  Laterality: Right;   INSERTION OF DIALYSIS CATHETER Right 07/09/2021   Procedure: INSERTION OF TUNNELED DIALYSIS CATHETER;  Surgeon: Leonie Douglas, MD;  Location: [facility] OR;  Service: Vascular;  Laterality: Right;   INSERTION OF DIALYSIS CATHETER Left 07/09/2021   Procedure: INSERTION OF TUNNELED  DIALYSIS CATHETER;  Surgeon: Leonie Douglas, MD;  Location: [facility] OR;  Service: Vascular;  Laterality: Left;   INSERTION OF DIALYSIS CATHETER N/A 06/22/2022   Procedure: INSERTION OF LEFT INTERNAL JUGULAR TRIALYSIS DIALYSIS CATHETER;  Surgeon: Chuck Hint, MD;  Location: [facility] OR;  Service: Vascular;  Laterality: N/A;   INSERTION OF DIALYSIS CATHETER Left 06/28/2022   Procedure: ULTRASOUND GUIDED INSERTION OF TUNNELED DIALYSIS CATHETER;  Surgeon: Leonie Douglas, MD;  Location: [facility] OR;  Service: Vascular;  Laterality: Left;   INSERTION OF DIALYSIS CATHETER Left 11/29/2022   Procedure: INSERTION OF TUNNELED DIALYSIS CATHETER TO LEFT FEMORAL VEIN;  Surgeon: Victorino Sparrow, MD;  Location: [facility] OR;  Service: Vascular;  Laterality: Left;   IR FLUORO GUIDE CV LINE RIGHT  06/02/2020   IR US GUIDE VASC ACCESS RIGHT  06/02/2020   LASER PHOTO ABLATION Right 07/20/2018   Procedure: Laser Photo Ablation RIGHT EYE;  Surgeon: Rennis Chris, MD;  Location: [facility] OR;  Service: Ophthalmology;  Laterality: Right;   MEMBRANE PEEL Left  07/20/2018   Procedure: Membrane Peel LEFT EYE;  Surgeon: Rennis Chris, MD;  Location: [facility] OR;  Service: Ophthalmology;  Laterality: Left;   MEMBRANE PEEL Left 07/19/2019   Procedure: MEMBRANE PEEL;  Surgeon: Rennis Chris, MD;  Location: [facility] OR;  Service: Ophthalmology;  Laterality: Left;   MITOMYCIN C APPLICATION Bilateral 07/20/2018   Procedure: Avastin Application;  Surgeon: Rennis Chris, MD;  Location: [facility] OR;  Service: Ophthalmology;  Laterality: Bilateral;   PHOTOCOAGULATION WITH LASER Left 07/20/2018   Procedure: Photocoagulation With Laser LEFT EYE;  Surgeon: Rennis Chris, MD;  Location: [facility] OR;  Service: Ophthalmology;  Laterality: Left;   PHOTOCOAGULATION WITH LASER Left 07/19/2019   Procedure: PHOTOCOAGULATION WITH LASER;  Surgeon: Rennis Chris, MD;  Location: [facility] OR;  Service: Ophthalmology;  Laterality: Left;   RETINAL DETACHMENT SURGERY Left 07/20/2018   Dr. Rennis Chris   SILICON OIL REMOVAL Left 07/19/2019   Procedure: 25g PARS PLANA VITRECTOMY WITH SILICON OIL REMOVAL;  Surgeon: Rennis Chris, MD;  Location: [facility] OR;  Service: Ophthalmology;  Laterality: Left;   TEE WITHOUT CARDIOVERSION N/A 07/08/2021   Procedure:  TRANSESOPHAGEAL ECHOCARDIOGRAM (TEE);  Surgeon: Little Ishikawa, MD;  Location: [facility] ENDOSCOPY;  Service: Cardiovascular;  Laterality: N/A;   TEE WITHOUT CARDIOVERSION N/A 06/29/2022   Procedure: TRANSESOPHAGEAL ECHOCARDIOGRAM;  Surgeon: Wendall Stade, MD;  Location: [facility] INVASIVE CV LAB;  Service: Cardiovascular;  Laterality: N/A;   WISDOM TOOTH EXTRACTION      Family History  Problem Relation Age of Onset   Diabetes Mother    Aneurysm Mother 85   Seizures Mother    Diabetes Father    Cataracts Father    COPD Father    Heart attack Father    Heart disease Father    Healthy Sister    Healthy Daughter    Stroke Maternal Grandfather    Amblyopia Neg Hx    Blindness Neg Hx    Glaucoma Neg Hx    Macular degeneration Neg Hx    Retinal detachment Neg Hx     Strabismus Neg Hx    Retinitis pigmentosa Neg Hx    Colon cancer Neg Hx    Stomach cancer Neg Hx    Esophageal cancer Neg Hx    Pancreatic cancer Neg Hx    Liver disease Neg Hx     Social History:  reports that she has never smoked. She has never been exposed to tobacco smoke. She has never used smokeless tobacco. She reports that she does not currently use alcohol. She reports that she does not use drugs.  Allergies: No Known Allergies  Medications: I have reviewed the patient's current medications.  Results for orders placed or performed during the hospital encounter of 01/15/23 (from the past 48 hours)  Comprehensive metabolic panel     Status: Abnormal   Collection Time: 01/15/23  8:10 PM  Result Value Ref Range   Sodium 135 135 - 145 mmol/L   Potassium 4.8 3.5 - 5.1 mmol/L   Chloride 89 (L) 98 - 111 mmol/L   CO2 23 22 - 32 mmol/L   Glucose, Bld 167 (H) 70 - 99 mg/dL    Comment: Glucose reference range applies only to samples taken after fasting for at least 8 hours.   BUN 40 (H) 6 - 20 mg/dL   Creatinine, Ser 16.10 (H) 0.44 - 1.00 mg/dL   Calcium 96.0 8.9 - 45.4 mg/dL   Total Protein 8.8 (H) 6.5 - 8.1 g/dL   Albumin 4.6 3.5 - 5.0 g/dL   AST 25 15 - 41 U/L   ALT 18 0 - 44 U/L   Alkaline Phosphatase 191 (H) 38 - 126 U/L   Total Bilirubin 1.1 <1.2 mg/dL   GFR, Estimated 4 (L) >60 mL/min    Comment: (NOTE) Calculated using the CKD-EPI Creatinine Equation (2021)    Anion gap 23 (H) 5 - 15    Comment: ELECTROLYTES REPEATED TO VERIFY Performed at [facility] Lab, [address] [place], [place] [address]   CBC with Differential     Status: Abnormal   Collection Time: 01/15/23  8:10 PM  Result Value Ref Range   WBC 7.7 4.0 - 10.5 K/uL   RBC 4.35 3.87 - 5.11 MIL/uL   Hemoglobin 12.5 12.0 - 15.0 g/dL   HCT 91.4 78.2 - 95.6 %   MCV 93.1 80.0 - 100.0 fL   MCH 28.7 26.0 - 34.0 pg   MCHC 30.9 30.0 - 36.0 g/dL   RDW 21.3 (H) 08.6 - 57.8 %   Platelets 161 150 - 400 K/uL     Comment: REPEATED TO VERIFY  nRBC 0.4 (H) 0.0 - 0.2 %   Neutrophils Relative % 83 %   Neutro Abs 6.4 1.7 - 7.7 K/uL   Lymphocytes Relative 9 %   Lymphs Abs 0.7 0.7 - 4.0 K/uL   Monocytes Relative 6 %   Monocytes Absolute 0.5 0.1 - 1.0 K/uL   Eosinophils Relative 0 %   Eosinophils Absolute 0.0 0.0 - 0.5 K/uL   Basophils Relative 1 %   Basophils Absolute 0.0 0.0 - 0.1 K/uL   Immature Granulocytes 1 %   Abs Immature Granulocytes 0.04 0.00 - 0.07 K/uL    Comment: Performed at [facility], [address], [place], [place] [address]  Lipase, blood     Status: None   Collection Time: 01/15/23  8:10 PM  Result Value Ref Range   Lipase 39 11 - 51 U/L    Comment: Performed at [facility], [address] [place], [place] [address]  hCG, serum, qualitative     Status: None   Collection Time: 01/15/23  8:10 PM  Result Value Ref Range   Preg, Serum NEGATIVE NEGATIVE    Comment:        THE SENSITIVITY OF THIS METHODOLOGY IS >10 mIU/mL. Performed at [facility], [address], [place], [place] [address]     CT ABDOMEN PELVIS WO CONTRAST Result Date: 01/15/2023 CLINICAL DATA:  Acute onset abdominal pain with nausea and vomiting EXAM: CT ABDOMEN AND PELVIS WITHOUT CONTRAST TECHNIQUE: Multidetector CT imaging of the abdomen and pelvis was performed following the standard protocol without IV contrast. RADIATION DOSE REDUCTION: This exam was performed according to the departmental dose-optimization program which includes automated exposure control, adjustment of the mA and/or kV according to patient size and/or use of iterative reconstruction technique. COMPARISON:  CT abdomen and pelvis dated 07/03/2021 FINDINGS: Lower chest: Bibasilar punctate calcifications again seen. No pleural effusion or pneumothorax demonstrated. Partially imaged heart size is normal. Hepatobiliary: No focal hepatic lesions. No intra or extrahepatic biliary ductal dilation. Normal gallbladder.  Pancreas: No focal lesions or main ductal dilation. Spleen: Normal in size without focal abnormality. Adrenals/Urinary Tract: No adrenal nodules. No suspicious renal mass on this noncontrast enhanced examination, calculi or hydronephrosis. Bladder is decompressed. Stomach/Bowel: Normal appearance of the stomach. No evidence of bowel wall thickening, distention, or inflammatory changes. Small volume stool within the ascending and transverse colon. Mildly dilated retrocecal appendix measures up to 11 mm (4:52), previously 6 mm on 07/03/2021. Vascular/Lymphatic: Vascular catheter reaches the right atrium. Aortic atherosclerosis. No enlarged abdominal or pelvic lymph nodes. Reproductive: No adnexal masses. Other: Minimal periappendiceal stranding. No free air or fluid collection. Musculoskeletal: No acute or abnormal lytic or blastic osseous lesions. IMPRESSION: 1. Mildly dilated retrocecal appendix measures up to 11 mm, previously 6 mm on 07/03/2021. Minimal periappendiceal stranding. Findings may reflect early acute appendicitis. 2.  Aortic Atherosclerosis (ICD10-I70.0). Electronically Signed   By: Agustin Cree M.D.   On: 01/15/2023 21:36    ROS 10 point review of systems is negative except as listed above in HPI.   Physical Exam Blood pressure (!) 157/97, pulse 91, temperature 98.5 F (36.9 C), temperature source Oral, resp. rate 16, height 5\' 6"  (1.676 m), weight 66.5 kg, SpO2 100%. Constitutional: well-developed, well-nourished*** HEENT: pupils equal, round, reactive to light, 2***mm b/l, moist conjunctiva, external inspection of ears and nose normal, hearing {Blank single:19197::"intact","diminished","unable to be assessed"} Oropharynx: normal oropharyngeal mucosa, {Blank single:19197::"normal","poor"} dentition Neck: no thyromegaly, trachea midline, {Blank single:19197::"+","no","unable to assess"} midline  cervical tenderness to palpation Chest: breath sounds equal bilaterally, {Blank  single:19197::"normal","absent","labored"} respiratory effort, {Blank single:19197::"+","no"} midline or lateral chest wall tenderness to palpation/deformity Abdomen: soft, NT, no bruising, no hepatosplenomegaly GU: {Blank single:19197::"no blood at urethral meatus of penis, no scrotal masses or abnormality","normal female genitalia"}  Back: no wounds, {Blank single:19197::"+","no","unable to assess"} thoracic/lumbar spine tenderness to palpation, {Blank single:19197::"+","no"} thoracic/lumbar spine stepoffs Rectal: {Blank single:19197::"good tone, no blood","deferred"} Extremities: 2+ radial and pedal pulses bilaterally, {Blank single:19197::"intact","unable to assess"} motor and sensation bilateral UE and LE, {Blank single:19197::"+","no"} peripheral edema MSK: {Blank single:19197::"normal","abnormal","unable to assess"} gait/station, no clubbing/cyanosis of fingers/toes, {Blank single:19197::"normal","limited","unable to assess"} ROM of all four extremities Skin: warm, dry, no rashes Psych: {Blank single:19197::"normal memory, normal mood/affect","unable to assess"}     Assessment/Plan: ***  *** -  FEN - {diet:29922} DVT - SCDs, {Blank single:19197::"LMWH","LMWH 40BID","SQH","hold chemical ppx due to bleeding concerns"} Dispo - {Blank single:19197::"ICU","4NP","med-surg","***"}    Diamantina Monks, MD General and Trauma Surgery [facility]

## 2023-01-15 NOTE — Hospital Course (Signed)
Abdominal pain started today around 0700. She states that she thinks it was gas at first and then she tried to use the bathroom but did not help. She states in is in her right lower quadrant. She reports having associated nausea and vomiting. She reports that she is throwing up ginger ale as that is what she has been drinking. She denies any diarrhea or constipation. She denies any issues with bloody stools or bloody emesis. She denies any fevers, chills, or any shortness of breath. Sugars have been measuring well. She notes that it was 150 today. Last time she had dialysis was yesterday. She tried to take her medications but was not able to keep any of them down.   Meds:  Calcitriol 0.5 mcg M, W, and F w/ HD  Hydralazine 100 mg TID  Carvedilol 25 mg BID  Sensipar 30 mg daily  Amlodipine 10 mg daily  Tresiba 18 units in afternoon  Humulog 6 units TID with meals  Velphoro 500 mg TID Losartan 100 mg daily  Mirtazpine 15 mg daily    Past Medical History ESRD secondary to hypertension and diabetes T1DM  HTN   Social:  Lives With: Son and boyfriend Occupation: None  Support: Independent of iADLs and ADLs PCP: Dr. Faith Rogue Substances: Denies tobacco use, alcohol use, or recreational drug use    Family History:  Mother and father had T2DM Denies any history of cancer  Half-sister diabetes, unclear type    Allergies: NKDA    She still has some abdominal pain when she presses in RLQ. She denies nausea, vomiting. She does not have acute complaints.

## 2023-01-15 NOTE — ED Notes (Signed)
Pt placed on 2L of O2. 

## 2023-01-16 ENCOUNTER — Encounter (HOSPITAL_COMMUNITY): Payer: Self-pay | Admitting: Internal Medicine

## 2023-01-16 DIAGNOSIS — K37 Unspecified appendicitis: Secondary | ICD-10-CM

## 2023-01-16 LAB — RENAL FUNCTION PANEL
Albumin: 3.7 g/dL (ref 3.5–5.0)
Anion gap: 19 — ABNORMAL HIGH (ref 5–15)
BUN: 45 mg/dL — ABNORMAL HIGH (ref 6–20)
CO2: 27 mmol/L (ref 22–32)
Calcium: 8.9 mg/dL (ref 8.9–10.3)
Chloride: 93 mmol/L — ABNORMAL LOW (ref 98–111)
Creatinine, Ser: 12.03 mg/dL — ABNORMAL HIGH (ref 0.44–1.00)
GFR, Estimated: 4 mL/min — ABNORMAL LOW (ref >60)
Glucose, Bld: 109 mg/dL — ABNORMAL HIGH (ref 70–99)
Phosphorus: 7.6 mg/dL — ABNORMAL HIGH (ref 2.5–4.6)
Potassium: 4.5 mmol/L (ref 3.5–5.1)
Sodium: 139 mmol/L (ref 135–145)

## 2023-01-16 LAB — CBC
HCT: 38 % (ref 36.0–46.0)
Hemoglobin: 11.9 g/dL — ABNORMAL LOW (ref 12.0–15.0)
MCH: 29.4 pg (ref 26.0–34.0)
MCHC: 31.3 g/dL (ref 30.0–36.0)
MCV: 93.8 fL (ref 80.0–100.0)
Platelets: 243 10*3/uL (ref 150–400)
RBC: 4.05 MIL/uL (ref 3.87–5.11)
RDW: 18.3 % — ABNORMAL HIGH (ref 11.5–15.5)
WBC: 8 10*3/uL (ref 4.0–10.5)
nRBC: 0.3 % — ABNORMAL HIGH (ref 0.0–0.2)

## 2023-01-16 LAB — MRSA NEXT GEN BY PCR, NASAL: MRSA by PCR Next Gen: NOT DETECTED

## 2023-01-16 LAB — CBG MONITORING, ED
Glucose-Capillary: 112 mg/dL — ABNORMAL HIGH (ref 70–99)
Glucose-Capillary: 162 mg/dL — ABNORMAL HIGH (ref 70–99)
Glucose-Capillary: 118 mg/dL — ABNORMAL HIGH (ref 70–99)

## 2023-01-16 LAB — GLUCOSE, CAPILLARY: Glucose-Capillary: 81 mg/dL (ref 70–99)

## 2023-01-16 MED ORDER — INSULIN GLARGINE-YFGN 100 UNIT/ML ~~LOC~~ SOLN: 10.0000 [IU] | Freq: Every day | SUBCUTANEOUS | Status: DC

## 2023-01-16 MED ORDER — DEXTROSE 10 % IV SOLN: INTRAVENOUS | Status: DC

## 2023-01-16 MED ORDER — DEXTROSE-SODIUM CHLORIDE 5-0.45 % IV SOLN: INTRAVENOUS | Status: DC

## 2023-01-16 MED ORDER — INSULIN GLARGINE-YFGN 100 UNIT/ML ~~LOC~~ SOLN
10.0000 [IU] | Freq: Every day | SUBCUTANEOUS | Status: DC
Start: 2023-01-16 — End: 2023-01-18
  Administered 2023-01-16 – 2023-01-17 (×2): 10 [IU] via SUBCUTANEOUS
  Filled 2023-01-16 (×3): qty 0.1

## 2023-01-16 MED ORDER — CHLORHEXIDINE GLUCONATE CLOTH 2 % EX PADS
6.0000 | MEDICATED_PAD | Freq: Every day | CUTANEOUS | Status: DC
  Administered 2023-01-17: 6 via TOPICAL

## 2023-01-16 MED ORDER — DEXTROSE 10 % IV SOLN
INTRAVENOUS | Status: AC
  Filled 2023-01-16: qty 1000

## 2023-01-16 MED ORDER — INSULIN GLARGINE-YFGN 100 UNIT/ML ~~LOC~~ SOLN
10.0000 [IU] | Freq: Every day | SUBCUTANEOUS | Status: DC
  Filled 2023-01-16: qty 0.1

## 2023-01-16 MED ORDER — ACETAMINOPHEN 500 MG PO TABS
1000.0000 mg | ORAL_TABLET | Freq: Three times a day (TID) | ORAL | Status: DC
  Administered 2023-01-16 – 2023-01-18 (×6): 1000 mg via ORAL
  Filled 2023-01-16 (×6): qty 2

## 2023-01-16 NOTE — Consult Note (Signed)
[facility] Renal Consultation Note  Indication for Consultation:  Management of ESRD/hemodialysis; anemia, hypertension/volume and secondary hyperparathyroidism  HPI: Barbara Donovan is a 32 y.o. female with ESRD chronic HD compliant with HD currently TCU unit at [facility],, other med problems as noted below, presents to the ER with 1 day history of acute abdominal pain.  Dialysis yesterday on schedule.  Denies F, C, chest pain shortness of breath, rash, hematuria hematemesis dysuria constipation.  In ER studies found to have acute sinusitis General Surgery consulting, currently on IV antibiotics treating conservatively pending clinical management may need OR.  We are consulted for ESRD HD management.  Chest x-ray no acute pulmonary disease, K4.5 no indication for HD needs today.  Noted resting right upper arm AV fistula at outpatient unit status post infiltration, using left femoral TDC, plans for for home HD when training available.  Dr. Ronalee Belts will follow on home HD    Past Medical History:  Diagnosis Date   Anxiety    Asthma    as a child, no problems as an adult, no inhaler   Cataract    NS OU   Chronic hypertension during pregnancy, antepartum 08/19/2017   Dehydration 01/28/2018   Depression    Depression during pregnancy, antepartum 07/07/2017   6/20: Short trial of zoloft previously, reports didn't help much but also didn't give it a chance Discussed r/b/a SSRIs in pregnancy, agrees to try Zoloft again, rx sent No SI/HI/red flags   Diabetes (HCC)    TYPE I. 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 A2/B GDM)  Required Referrals for A1GDM or A2GDM: [x]  Diabetes Education and Testing Supplies [x]  Nutrition Cousult  For A2/B GDM or higher classes of DM [x]  Diabetes Education and Testing Supplies [x]  Nutrition Counsult [x]  Fetal ECHO after 20 weeks  [x]  Eye exam for retina evaluation - severe retinopathy 7/19  Base   Ventricular septal defect (VSD) of fetus in singleton pregnancy, antepartum 09/30/2017   May go to newborn nursery per Dr. [place] Bing Echo prior to discharge    Past Surgical History:  Procedure Laterality Date   25 GAUGE PARS PLANA VITRECTOMY WITH 20 GAUGE MVR PORT FOR MACULAR HOLE Left 07/20/2018   Procedure: 25 GAUGE PARS PLANA VITRECTOMY LEFT EYE;  Surgeon: Rennis Chris, MD;  Location: [facility] OR;  Service: Ophthalmology;  Laterality: Left;   AV FISTULA PLACEMENT Left 08/11/2021   Procedure: INSERTION OF LEFT ARM ARTERIOVENOUS (AV) GORE-TEX GRAFT;  Surgeon: Victorino Sparrow, MD;  Location: [facility] OR;  Service: Vascular;  Laterality: Left;  PERIPHERAL NERVE BLOCK   AV FISTULA PLACEMENT Right 08/24/2022   Procedure: RIGHT UPPER EXTREMITY ARTERIOVENOUS (AV) FISTULA CREATION;  Surgeon: Chuck Hint, MD;  Location: [facility] OR;  Service: Vascular;  Laterality: Right;   AVGG REMOVAL Left 06/22/2022   Procedure: REMOVAL OF ARTERIOVENOUS GORETEX GRAFT (AVGG) LEFT  ARM WITH VEIN PATCH OF BRACHIAL ARTERY;  Surgeon: Chuck Hint, MD;  Location: [facility] OR;  Service: Vascular;  Laterality: Left;   BASCILIC VEIN TRANSPOSITION Right 11/29/2022   Procedure: RIGHT ARM BASILIC VEIN TRANSPOSITION;  Surgeon: Victorino Sparrow, MD;  Location: [facility] OR;  Service: Vascular;  Laterality: Right;   CAPD INSERTION N/A 09/10/2020   Procedure:  LAPAROSCOPIC INSERTION CONTINUOUS AMBULATORY PERITONEAL DIALYSIS  (CAPD) CATHETER;  Surgeon: Nada Libman, MD;  Location: [facility] OR;  Service: Vascular;  Laterality: N/A;   CESAREAN SECTION N/A 12/26/2017   Procedure: CESAREAN SECTION;  Surgeon: Tereso Newcomer, MD;  Location: [facility];  Service: Obstetrics;  Laterality: N/A;   EYE EXAMINATION UNDER ANESTHESIA Right 07/20/2018   Procedure: Eye Exam Under Anesthesia RIGHT EYE;  Surgeon: Rennis Chris, MD;  Location: [facility] OR;  Service: Ophthalmology;  Laterality: Right;   EYE SURGERY Left 07/2018   GAS INSERTION Left 07/19/2019   Procedure: INSERTION OF GAS;  Surgeon: Rennis Chris, MD;  Location: [facility] OR;  Service: Ophthalmology;  Laterality: Left;  SF6   INJECTION OF SILICONE OIL Left 07/20/2018   Procedure: Injection Of Silicone Oil LEFT EYE;  Surgeon: Rennis Chris, MD;  Location: [facility] OR;  Service: Ophthalmology;  Laterality: Left;   INSERTION OF DIALYSIS CATHETER Right 07/05/2021   Procedure: REMOVAL OF DIALYSIS CATHETER;  Surgeon: Leonie Douglas, MD;  Location: [facility] OR;  Service: Vascular;  Laterality: Right;   INSERTION OF DIALYSIS CATHETER Right 07/09/2021   Procedure: INSERTION OF TUNNELED DIALYSIS CATHETER;  Surgeon: Leonie Douglas, MD;  Location: [facility] OR;  Service: Vascular;  Laterality: Right;   INSERTION OF DIALYSIS CATHETER Left 07/09/2021   Procedure: INSERTION OF TUNNELED  DIALYSIS CATHETER;  Surgeon: Leonie Douglas, MD;  Location: [facility] OR;  Service: Vascular;  Laterality: Left;   INSERTION OF DIALYSIS CATHETER N/A 06/22/2022   Procedure: INSERTION OF LEFT INTERNAL JUGULAR TRIALYSIS DIALYSIS CATHETER;  Surgeon: Chuck Hint, MD;  Location: [facility] OR;  Service: Vascular;  Laterality: N/A;   INSERTION OF DIALYSIS CATHETER Left 06/28/2022   Procedure: ULTRASOUND GUIDED INSERTION OF TUNNELED DIALYSIS CATHETER;  Surgeon: Leonie Douglas, MD;  Location: [facility] OR;  Service: Vascular;  Laterality: Left;   INSERTION OF DIALYSIS CATHETER Left  11/29/2022   Procedure: INSERTION OF TUNNELED DIALYSIS CATHETER TO LEFT FEMORAL VEIN;  Surgeon: Victorino Sparrow, MD;  Location: [facility] OR;  Service: Vascular;  Laterality: Left;   IR FLUORO GUIDE CV LINE RIGHT  06/02/2020   IR US GUIDE VASC ACCESS RIGHT  06/02/2020   LASER PHOTO ABLATION Right 07/20/2018   Procedure: Laser Photo Ablation RIGHT EYE;  Surgeon: Rennis Chris, MD;  Location: [facility] OR;  Service: Ophthalmology;  Laterality: Right;   MEMBRANE PEEL Left 07/20/2018   Procedure: Membrane Peel LEFT EYE;  Surgeon: Rennis Chris, MD;  Location: [facility] OR;  Service: Ophthalmology;  Laterality: Left;   MEMBRANE PEEL Left 07/19/2019   Procedure: MEMBRANE PEEL;  Surgeon: Rennis Chris, MD;  Location: [facility] OR;  Service: Ophthalmology;  Laterality: Left;   MITOMYCIN C APPLICATION Bilateral 07/20/2018   Procedure: Avastin Application;  Surgeon: Rennis Chris, MD;  Location: [facility] OR;  Service: Ophthalmology;  Laterality: Bilateral;   PHOTOCOAGULATION WITH LASER Left 07/20/2018   Procedure: Photocoagulation With Laser LEFT EYE;  Surgeon: Rennis Chris, MD;  Location: [facility] OR;  Service: Ophthalmology;  Laterality: Left;   PHOTOCOAGULATION WITH LASER Left 07/19/2019   Procedure: PHOTOCOAGULATION WITH LASER;  Surgeon: Rennis Chris, MD;  Location: [facility]  OR;  Service: Ophthalmology;  Laterality: Left;   RETINAL DETACHMENT SURGERY Left 07/20/2018   Dr. Rennis Chris   SILICON OIL REMOVAL Left 07/19/2019   Procedure: 25g PARS PLANA VITRECTOMY WITH SILICON OIL REMOVAL;  Surgeon: Rennis Chris, MD;  Location: [facility] OR;  Service: Ophthalmology;  Laterality: Left;   TEE WITHOUT CARDIOVERSION N/A 07/08/2021   Procedure: TRANSESOPHAGEAL ECHOCARDIOGRAM (TEE);  Surgeon: Little Ishikawa, MD;  Location: [facility] ENDOSCOPY;  Service: Cardiovascular;  Laterality: N/A;   TEE WITHOUT CARDIOVERSION N/A 06/29/2022   Procedure: TRANSESOPHAGEAL ECHOCARDIOGRAM;  Surgeon: Wendall Stade, MD;  Location: [facility] INVASIVE CV LAB;  Service: Cardiovascular;  Laterality:  N/A;   WISDOM TOOTH EXTRACTION        Family History  Problem Relation Age of Onset   Diabetes Mother    Aneurysm Mother 75   Seizures Mother    Diabetes Father    Cataracts Father    COPD Father    Heart attack Father    Heart disease Father    Healthy Sister    Healthy Daughter    Stroke Maternal Grandfather    Amblyopia Neg Hx    Blindness Neg Hx    Glaucoma Neg Hx    Macular degeneration Neg Hx    Retinal detachment Neg Hx    Strabismus Neg Hx    Retinitis pigmentosa Neg Hx    Colon cancer Neg Hx    Stomach cancer Neg Hx    Esophageal cancer Neg Hx    Pancreatic cancer Neg Hx    Liver disease Neg Hx       reports that she has never smoked. She has never been exposed to tobacco smoke. She has never used smokeless tobacco. She reports that she does not currently use alcohol. She reports that she does not use drugs.  No Known Allergies  Prior to Admission medications   Medication Sig Start Date End Date Taking? Authorizing Provider  acetaminophen (TYLENOL) 325 MG tablet Take 650 mg by mouth every 6 (six) hours as needed for mild pain.   Yes [provider]  amLODipine (NORVASC) 10 MG tablet Take 1 tablet (10 mg total) by mouth daily. Patient taking differently: Take 10 mg by mouth at bedtime. 03/09/20  Yes Almon Hercules, MD  calcitRIOL (ROCALTROL) 0.5 MCG capsule Take 0.5 mcg by mouth 4 (four) times a week. Monday, Tuesday, Thursday, and Friday 12/02/20  Yes [provider]  carvedilol (COREG) 25 MG tablet Take 1 tablet (25 mg total) by mouth 2 (two) times daily. 03/07/19  Yes Dyann Kief, PA-C  ethyl chloride spray Apply 1 Application topically 4 (four) times a week. Apply to arm on Monday, Tuesday, Thursday, and Friday 05/09/22  Yes [provider]  hydrALAZINE (APRESOLINE) 100 MG tablet Take 100 mg by mouth 2 (two) times daily. 12/23/22  Yes [provider]  insulin degludec (TRESIBA FLEXTOUCH) 200 UNIT/ML FlexTouch Pen Inject 18  Units into the skin daily in the afternoon. 11/02/22  Yes Shamleffer, Konrad Dolores, MD  insulin lispro (HUMALOG KWIKPEN) 100 UNIT/ML KwikPen Max daily 40 units Patient taking differently: Inject 6 Units into the skin 3 (three) times daily with meals. Max daily 40 units 08/26/22  Yes Shamleffer, Konrad Dolores, MD  lidocaine (LMX) 4 % cream Apply 1 Application topically once a week. Apply on Monday,Tuesday, Thursday, and Friday   Yes [provider]  losartan (COZAAR) 100 MG tablet Take 100 mg by mouth at bedtime. 08/06/22  Yes [provider]  metoCLOPramide (REGLAN) 10 MG tablet Take 1 tablet (10 mg total) by mouth every 6 (six) hours. Patient taking differently: Take 10 mg by mouth every 6 (six) hours as needed for nausea or vomiting. 12/05/21  Yes Edwin Dada P, DO  mirtazapine (REMERON) 15 MG tablet Take 1 tablet (15 mg total) by mouth at bedtime. Do not take medicine in the morning and drive. Patient taking differently: Take 15 mg by mouth at bedtime. 10/05/22 10/05/23 Yes Bender, Irving Burton, DO  multivitamin (RENA-VIT) TABS tablet Take 1 tablet by mouth at bedtime. 03/08/20  Yes Almon Hercules, MD  ondansetron (ZOFRAN-ODT) 8 MG disintegrating tablet Take 1 tablet (8 mg total) by mouth every 8 (eight) hours as needed for nausea or vomiting. 07/31/21  Yes Dione Booze, MD  SENSIPAR 60 MG tablet Take 60 mg by mouth at bedtime. 12/23/22  Yes [provider]  sucroferric oxyhydroxide (VELPHORO) 500 MG chewable tablet Chew 1,500 mg by mouth 3 (three) times daily with meals.   Yes [provider]      Results for orders placed or performed during the hospital encounter of 01/15/23 (from the past 48 hours)  Comprehensive metabolic panel     Status: Abnormal   Collection Time: 01/15/23  8:10 PM  Result Value Ref Range   Sodium 135 135 - 145 mmol/L   Potassium 4.8 3.5 - 5.1 mmol/L   Chloride 89 (L) 98 - 111 mmol/L   CO2 23 22 - 32 mmol/L   Glucose, Bld 167 (H) 70 - 99  mg/dL    Comment: Glucose reference range applies only to samples taken after fasting for at least 8 hours.   BUN 40 (H) 6 - 20 mg/dL   Creatinine, Ser 14.78 (H) 0.44 - 1.00 mg/dL   Calcium 29.5 8.9 - 62.1 mg/dL   Total Protein 8.8 (H) 6.5 - 8.1 g/dL   Albumin 4.6 3.5 - 5.0 g/dL   AST 25 15 - 41 U/L   ALT 18 0 - 44 U/L   Alkaline Phosphatase 191 (H) 38 - 126 U/L   Total Bilirubin 1.1 <1.2 mg/dL   GFR, Estimated 4 (L) >60 mL/min    Comment: (NOTE) Calculated using the CKD-EPI Creatinine Equation (2021)    Anion gap 23 (H) 5 - 15    Comment: ELECTROLYTES REPEATED TO VERIFY Performed at [facility], [address], [place], [place] [address]   CBC with Differential     Status: Abnormal   Collection Time: 01/15/23  8:10 PM  Result Value Ref Range   WBC 7.7 4.0 - 10.5 K/uL   RBC 4.35 3.87 - 5.11 MIL/uL   Hemoglobin 12.5 12.0 - 15.0 g/dL   HCT 78.4 69.6 - 29.5 %   MCV 93.1 80.0 - 100.0 fL   MCH 28.7 26.0 - 34.0 pg   MCHC 30.9 30.0 - 36.0 g/dL   RDW 28.4 (H) 13.2 - 44.0 %   Platelets 161 150 - 400 K/uL    Comment: REPEATED TO VERIFY   nRBC 0.4 (H) 0.0 - 0.2 %   Neutrophils Relative % 83 %   Neutro Abs 6.4 1.7 - 7.7 K/uL   Lymphocytes Relative 9 %   Lymphs Abs 0.7 0.7 - 4.0 K/uL   Monocytes Relative 6 %   Monocytes Absolute 0.5 0.1 - 1.0 K/uL   Eosinophils Relative 0 %   Eosinophils Absolute 0.0 0.0 - 0.5 K/uL   Basophils Relative 1 %   Basophils Absolute 0.0 0.0 -  0.1 K/uL   Immature Granulocytes 1 %   Abs Immature Granulocytes 0.04 0.00 - 0.07 K/uL    Comment: Performed at [facility], [address], [place], [place] [address]  Lipase, blood     Status: None   Collection Time: 01/15/23  8:10 PM  Result Value Ref Range   Lipase 39 11 - 51 U/L    Comment: Performed at [facility], [address] [place], [place] [address]  hCG, serum, qualitative     Status: None   Collection Time: 01/15/23  8:10 PM  Result Value Ref Range   Preg, Serum NEGATIVE  NEGATIVE    Comment:        THE SENSITIVITY OF THIS METHODOLOGY IS >10 mIU/mL. Performed at [facility], [address] [place], [place] [address]   CBG monitoring, ED     Status: Abnormal   Collection Time: 01/15/23 11:50 PM  Result Value Ref Range   Glucose-Capillary 144 (H) 70 - 99 mg/dL    Comment: Glucose reference range applies only to samples taken after fasting for at least 8 hours.  CBC     Status: Abnormal   Collection Time: 01/16/23  5:12 AM  Result Value Ref Range   WBC 8.0 4.0 - 10.5 K/uL   RBC 4.05 3.87 - 5.11 MIL/uL   Hemoglobin 11.9 (L) 12.0 - 15.0 g/dL   HCT 36.6 44.0 - 34.7 %   MCV 93.8 80.0 - 100.0 fL   MCH 29.4 26.0 - 34.0 pg   MCHC 31.3 30.0 - 36.0 g/dL   RDW 42.5 (H) 95.6 - 38.7 %   Platelets 243 150 - 400 K/uL   nRBC 0.3 (H) 0.0 - 0.2 %    Comment: Performed at [facility] Lab, [address] [place], [place] [address]  Renal function panel     Status: Abnormal   Collection Time: 01/16/23  5:12 AM  Result Value Ref Range   Sodium 139 135 - 145 mmol/L   Potassium 4.5 3.5 - 5.1 mmol/L   Chloride 93 (L) 98 - 111 mmol/L   CO2 27 22 - 32 mmol/L   Glucose, Bld 109 (H) 70 - 99 mg/dL    Comment: Glucose reference range applies only to samples taken after fasting for at least 8 hours.   BUN 45 (H) 6 - 20 mg/dL   Creatinine, Ser 29.51 (H) 0.44 - 1.00 mg/dL   Calcium 8.9 8.9 - 88.4 mg/dL   Phosphorus 7.6 (H) 2.5 - 4.6 mg/dL   Albumin 3.7 3.5 - 5.0 g/dL   GFR, Estimated 4 (L) >60 mL/min    Comment: (NOTE) Calculated using the CKD-EPI Creatinine Equation (2021)    Anion gap 19 (H) 5 - 15    Comment: Performed at [facility], [address], [place], [place] [address]  CBG monitoring, ED     Status: Abnormal   Collection Time: 01/16/23  9:55 AM  Result Value Ref Range   Glucose-Capillary 112 (H) 70 - 99 mg/dL    Comment: Glucose reference range applies only to samples taken after fasting for at least 8 hours.  CBG monitoring, ED      Status: Abnormal   Collection Time: 01/16/23 12:35 PM  Result Value Ref Range   Glucose-Capillary 162 (H) 70 - 99 mg/dL    Comment: Glucose reference range applies only to samples taken after fasting for at least 8 hours.    ROS: See HPI  Physical Exam: Vitals:   01/16/23 1415 01/16/23 1430  BP: 111/69 119/70  Pulse: 73 75  Resp:    Temp:    SpO2: 100% 100%     General: Alert young adult female pleasant, NAD HEENT: /AT, nonicteric, MMM Neck: Supple no JVD Heart: RRR no MRG appreciated Lungs: CTA bilaterally nonlabored breathing Abdomen: NABS soft tender right lower abdomen nondistended Extremities: No pedal edema Skin: Warm dry no overt rash or ulcers on exposed surfaces Neuro: O x 3, moves all extremities no acute focal deficits appreciated Dialysis Access: Right upper arm AV fistula positive bruit, left femoral PermCath dressing dry intact  Dialysis Orders: Center: [facility]CU unit M, TU, TH, Fri, 3 hours EDW 64.1 kg, HD Bath 2K bath 45 lactate Heparin 2000 units. Access right upper arm AV fistula resting status post infiltration, using the left femoral PermCath    Taking calcitriol 0.5 mcg p.o. daily  Mircera 150 mcg last given 01/11/23  Assessment/Plan Appendicitis= surgery consulted currently on antibiotics none surgical management currently pending clinical improvement ESRD -will continue HD MWF (outpatient TCU) K4.5 Hypertension/volume  -initially BP 230/128 as patient was unable to take meds yesterday secondary nausea vomiting intractable pain, continued on hydralazine 100 mg 3 times daily amlodipine 10 mg Coreg 25 mg, holding ARB/ BP improved now 125/81, no excess volume on exam or chest x-ray Anemia  -Hgb 11.9 holding ESA (last given 01/11/2023 as above, Metabolic bone disease -takes calcitriol daily / calcium stable phosphorus 7.6 continue outpatient binder Velphoro with meals, noted on Sensipar 60 mg at bedtime hold until GI process stable noted last outpatient PTH  2456 Nutrition -renal/carb modified, currently on liquids secondary to appendicitis Diabetes mellitus type 1= management per admit team  Lenny Pastel, PA-C [facility] Beeper [phone] 01/16/2023, 3:52 PM

## 2023-01-16 NOTE — ED Notes (Signed)
RN spoke the [place] [facility] MD. Per Dr Kathrene Bongo do not give the dextrose 5% and 0.45 infusion that was ordered.

## 2023-01-16 NOTE — Progress Notes (Signed)
Per MD, long acting insulin only to be administered once Dextrose is hung. Bag of fluid requested from pharmacy.

## 2023-01-16 NOTE — Progress Notes (Addendum)
Subjective Patient with continued pain. Denies n/v since antiemetics were given.   Physical exam Blood pressure 136/79, pulse 81, temperature 98.5 F (36.9 C), temperature source Oral, resp. rate 14, height 5\' 6"  (1.676 m), weight 66.5 kg, SpO2 98%.  Physical Exam: Constitutional: lying in bed slightly rolled onto left side, appears uncomfortable; prefers to remain still.  Cardiovascular: regular rate and rhythm, no m/r/g Pulmonary/Chest: normal work of breathing on room air, lungs clear to auscultation bilaterally Abdominal: soft, non-distended, mild tenderness to palpation in RLQ, no rebound/guarding MSK: normal bulk and tone Skin: warm and dry; no edema of extremities Psych: normal mood and behavior   Weight change:   No intake or output data in the 24 hours ending 01/16/23 1202 Net IO Since Admission: No IO data has been entered for this period [01/16/23 1202]  Labs, images, and other studies    Latest Ref Rng & Units 01/16/2023    5:12 AM 01/15/2023    8:10 PM 12/21/2022    4:22 PM  CBC  WBC 4.0 - 10.5 K/uL 8.0  7.7  5.2   Hemoglobin 12.0 - 15.0 g/dL 32.2  02.5  8.8   Hematocrit 36.0 - 46.0 % 38.0  40.5  27.1   Platelets 150 - 400 K/uL 243  161  245        Latest Ref Rng & Units 01/16/2023    5:12 AM 01/15/2023    8:10 PM 12/21/2022    4:22 PM  BMP  Glucose 70 - 99 mg/dL 427  062  376   BUN 6 - 20 mg/dL 45  40  28   Creatinine 0.44 - 1.00 mg/dL 28.31  51.76  1.60   BUN/Creat Ratio 9 - 23   4   Sodium 135 - 145 mmol/L 139  135  142   Potassium 3.5 - 5.1 mmol/L 4.5  4.8  4.2   Chloride 98 - 111 mmol/L 93  89  96   CO2 22 - 32 mmol/L 27  23  28    Calcium 8.9 - 10.3 mg/dL 8.9  73.7  9.6      Assessment and plan Hospital day 1  Barbara Donovan is a 32 y.o. female with pertinent past medical history of ESRD on HD MTTF, type 1 diabetes, asthma, and hypertension who is presenting with 1 day history of acute onset abdominal pain associated with  inability to eat and take her medications.   Principal Problem:   Acute appendicitis Active Problems:   Essential hypertension   Type 1 diabetes mellitus with hyperglycemia (HCC)   ESRD on hemodialysis (HCC)   #Acute Appendicitis CTAP consistent with acute appendicitis. Surgery consulted and patient agreeable for non-surgical management given mild/early care - unremarkable vitals/CBC and lack of fecalith on imaging. [facility] transplant surgeon was contacted regarding how management would affect candidacy and there is no difference between surgical/non-surgical approach.  -Appreciate Surgery recommendations -IV ceftriaxone, metronidazole (day 1) with plan for po transition with clinical improvement -Tylenol, Dilaudid, Oxycodone for pain -Tigan prn with QTc prolongation to 504 -Clear Liquid Diet -Daily CBC   #ESRD Patient receives HD MTTF through femoral tunneled dialysis catheter on the left lower extremity.  Last dialysis session was this past Friday. Nephrology consulted regarding need for inpatient HD tomorrow. No signs of volume overload on exam.  -Trend RFP -Continue home Calcitrol, Cinacalcet, Velphoro  - on [facility] transplant list   #  T1DM Last A1c = 6.2 on 09/23/2022.  Home medications include 18 units Tresiba nightly, 6 units Humalog with meals. Plan: -Adding 75 cc/hr D5 1/2NS for 10 hours to prevent euglycemic DKA -Patient started on clear liquid diet - assess tolerance  -SSI and insulin glargine (while in the hospital) 10 units at bedtime; will begin as soon as glucose increases adequately with combo of IV dextrose and clear liquid diet.   #Hypertension Presented with hypertensive urgency with initial BP 230/128. Patient unable to take medications yesterday 2/2 n/v and patient was also in considerable pain on presentation.  Denies symptoms. BP now normalized with at-home regimen - Resume hydralazine 100 mg 3 times daily, amlodipine 10 mg daily, Coreg 25 mg twice daily. Hold ARB.     #Alk Phos Elevation Unclear etiology, no RUQ pain at this time. Unremarkable hepatobiliary system on CTAP. May reflect renal osteodystrophy. -Trend HFP   Diet: Clear Liquid IVF: 75 cc/hr D5/1/2NS for 10 hours VTE: SCDs Start: 01/15/23 2252  Code: Full  Discharge plan: Pending further evaluation   [name], DO 01/16/2023, 12:02 PM  Pager: [phone] After 5pm or weekend: [phone]

## 2023-01-16 NOTE — Plan of Care (Signed)

## 2023-01-16 NOTE — Progress Notes (Signed)
Progress Note     Subjective: No nausea, pain improving  Objective: Vital signs in last 24 hours: Temp:  [98.3 F (36.8 C)-98.7 F (37.1 C)] 98.7 F (37.1 C) (12/29 0612) Pulse Rate:  [81-96] 81 (12/29 0612) Resp:  [14-18] 14 (12/29 0612) BP: (118-230)/(70-128) 119/74 (12/29 0612) SpO2:  [94 %-100 %] 98 % (12/29 0612) Weight:  [66.5 kg] 66.5 kg (12/28 1940) Last BM Date : 01/15/23  Intake/Output from previous day: No intake/output data recorded. Intake/Output this shift: No intake/output data recorded.  PE: Gen: NAD, resting comfortably in bed CV: RRR Pulm: NWOB on room air Abdomen: Soft, mildly tender to palpation in RLQ, nondistended Lab Results:  Recent Labs    01/15/23 2010 01/16/23 0512  WBC 7.7 8.0  HGB 12.5 11.9*  HCT 40.5 38.0  PLT 161 243   BMET Recent Labs    01/15/23 2010 01/16/23 0512  NA 135 139  K 4.8 4.5  CL 89* 93*  CO2 23 27  GLUCOSE 167* 109*  BUN 40* 45*  CREATININE 11.13* 12.03*  CALCIUM 10.0 8.9   PT/INR No results for input(s): "LABPROT", "INR" in the last 72 hours. CMP     Component Value Date/Time   NA 139 01/16/2023 0512   NA 142 12/21/2022 1622   K 4.5 01/16/2023 0512   CL 93 (L) 01/16/2023 0512   CO2 27 01/16/2023 0512   GLUCOSE 109 (H) 01/16/2023 0512   BUN 45 (H) 01/16/2023 0512   BUN 28 (H) 12/21/2022 1622   CREATININE 12.03 (H) 01/16/2023 0512   CALCIUM 8.9 01/16/2023 0512   PROT 8.8 (H) 01/15/2023 2010   PROT 4.5 (L) 12/22/2017 1459   ALBUMIN 3.7 01/16/2023 0512   ALBUMIN 2.2 (L) 12/22/2017 1459   AST 25 01/15/2023 2010   ALT 18 01/15/2023 2010   ALKPHOS 191 (H) 01/15/2023 2010   BILITOT 1.1 01/15/2023 2010   BILITOT <0.2 12/22/2017 1459   GFRNONAA 4 (L) 01/16/2023 0512   GFRAA 30 (L) 09/15/2019 0358   Lipase     Component Value Date/Time   LIPASE 39 01/15/2023 2010       Studies/Results: CT ABDOMEN PELVIS WO CONTRAST Result Date: 01/15/2023 CLINICAL DATA:  Acute onset abdominal pain with  nausea and vomiting EXAM: CT ABDOMEN AND PELVIS WITHOUT CONTRAST TECHNIQUE: Multidetector CT imaging of the abdomen and pelvis was performed following the standard protocol without IV contrast. RADIATION DOSE REDUCTION: This exam was performed according to the departmental dose-optimization program which includes automated exposure control, adjustment of the mA and/or kV according to patient size and/or use of iterative reconstruction technique. COMPARISON:  CT abdomen and pelvis dated 07/03/2021 FINDINGS: Lower chest: Bibasilar punctate calcifications again seen. No pleural effusion or pneumothorax demonstrated. Partially imaged heart size is normal. Hepatobiliary: No focal hepatic lesions. No intra or extrahepatic biliary ductal dilation. Normal gallbladder. Pancreas: No focal lesions or main ductal dilation. Spleen: Normal in size without focal abnormality. Adrenals/Urinary Tract: No adrenal nodules. No suspicious renal mass on this noncontrast enhanced examination, calculi or hydronephrosis. Bladder is decompressed. Stomach/Bowel: Normal appearance of the stomach. No evidence of bowel wall thickening, distention, or inflammatory changes. Small volume stool within the ascending and transverse colon. Mildly dilated retrocecal appendix measures up to 11 mm (4:52), previously 6 mm on 07/03/2021. Vascular/Lymphatic: Vascular catheter reaches the right atrium. Aortic atherosclerosis. No enlarged abdominal or pelvic lymph nodes. Reproductive: No adnexal masses. Other: Minimal periappendiceal stranding. No free air or fluid collection. Musculoskeletal: No acute or abnormal  lytic or blastic osseous lesions. IMPRESSION: 1. Mildly dilated retrocecal appendix measures up to 11 mm, previously 6 mm on 07/03/2021. Minimal periappendiceal stranding. Findings may reflect early acute appendicitis. 2.  Aortic Atherosclerosis (ICD10-I70.0). Electronically Signed   By: Agustin Cree M.D.   On: 01/15/2023 21:36     Anti-infectives: Anti-infectives (From admission, onward)    Start     Dose/Rate Route Frequency Ordered Stop   01/16/23 2200  cefTRIAXone (ROCEPHIN) 2 g in sodium chloride 0.9 % 100 mL IVPB        2 g 200 mL/hr over 30 Minutes Intravenous Every 24 hours 01/15/23 2302 01/19/23 2159   01/16/23 1000  metroNIDAZOLE (FLAGYL) IVPB 500 mg        500 mg 100 mL/hr over 60 Minutes Intravenous Every 12 hours 01/15/23 2302 01/19/23 0959   01/15/23 2215  cefTRIAXone (ROCEPHIN) 2 g in sodium chloride 0.9 % 100 mL IVPB       Placed in "And" Linked Group   2 g 200 mL/hr over 30 Minutes Intravenous  Once 01/15/23 2202 01/15/23 2254   01/15/23 2215  metroNIDAZOLE (FLAGYL) IVPB 500 mg       Placed in "And" Linked Group   500 mg 100 mL/hr over 60 Minutes Intravenous  Once 01/15/23 2202 01/15/23 2259        Assessment/Plan 32 yo F with ESRD on transplant list presenting with early acute appendicitis without appendicolith  Discussed operative and nonoperative management with patient. Discussed timeline for relisting with [facility] Transplant surgeon, Dr. Pricilla Riffle. No difference in relisting if non-op versus operative. Discussed this with patient. Given early appendicitis and no fecolith, and history of PD, will trial nonoperative management. We discussed if her symptoms worsen or if she fails to improve with nonoperative management then she will need an appendectomy this hospitalization.  - IV antibiotics for now, if continuing to improve can consider transitioning to PO in coming days. - OK for CLD today. If patient worsens then please make NPO again today and we will plan for laparoscopic appendectomy in PM versus tomorrow. - Rest of care per primary team   LOS: 1 day   I reviewed last 24 h vitals and pain scores, last 48 h intake and output, last 24 h labs and trends, and last 24 h imaging results. I discussed directly with the primary admitting team as well as transplant surgeon at [facility].  This  care required high  level of medical decision making.    Lysle Rubens, MD [facility] Surgery 01/16/2023, 9:47 AM Please see Amion for pager number during day hours 7:00am-4:30pm

## 2023-01-17 DIAGNOSIS — N186 End stage renal disease: Secondary | ICD-10-CM | POA: Diagnosis not present

## 2023-01-17 DIAGNOSIS — E1022 Type 1 diabetes mellitus with diabetic chronic kidney disease: Secondary | ICD-10-CM

## 2023-01-17 DIAGNOSIS — I120 Hypertensive chronic kidney disease with stage 5 chronic kidney disease or end stage renal disease: Secondary | ICD-10-CM | POA: Diagnosis not present

## 2023-01-17 DIAGNOSIS — Z992 Dependence on renal dialysis: Secondary | ICD-10-CM

## 2023-01-17 DIAGNOSIS — K3580 Unspecified acute appendicitis: Secondary | ICD-10-CM | POA: Diagnosis not present

## 2023-01-17 LAB — HEPATIC FUNCTION PANEL
ALT: 13 U/L (ref 0–44)
AST: 17 U/L (ref 15–41)
Albumin: 3.6 g/dL (ref 3.5–5.0)
Alkaline Phosphatase: 145 U/L — ABNORMAL HIGH (ref 38–126)
Bilirubin, Direct: <0.1 mg/dL (ref 0.0–0.2)
Total Bilirubin: 0.8 mg/dL (ref <1.2)
Total Protein: 7.5 g/dL (ref 6.5–8.1)

## 2023-01-17 LAB — RENAL FUNCTION PANEL
Albumin: 3.6 g/dL (ref 3.5–5.0)
Anion gap: 19 — ABNORMAL HIGH (ref 5–15)
BUN: 53 mg/dL — ABNORMAL HIGH (ref 6–20)
CO2: 24 mmol/L (ref 22–32)
Calcium: 8.7 mg/dL — ABNORMAL LOW (ref 8.9–10.3)
Chloride: 91 mmol/L — ABNORMAL LOW (ref 98–111)
Creatinine, Ser: 14.29 mg/dL — ABNORMAL HIGH (ref 0.44–1.00)
GFR, Estimated: 3 mL/min — ABNORMAL LOW (ref >60)
Glucose, Bld: 74 mg/dL (ref 70–99)
Phosphorus: 8 mg/dL — ABNORMAL HIGH (ref 2.5–4.6)
Potassium: 4.6 mmol/L (ref 3.5–5.1)
Sodium: 134 mmol/L — ABNORMAL LOW (ref 135–145)

## 2023-01-17 LAB — CBC
HCT: 37.3 % (ref 36.0–46.0)
Hemoglobin: 11.7 g/dL — ABNORMAL LOW (ref 12.0–15.0)
MCH: 29.3 pg (ref 26.0–34.0)
MCHC: 31.4 g/dL (ref 30.0–36.0)
MCV: 93.5 fL (ref 80.0–100.0)
Platelets: 265 10*3/uL (ref 150–400)
RBC: 3.99 MIL/uL (ref 3.87–5.11)
RDW: 18.5 % — ABNORMAL HIGH (ref 11.5–15.5)
WBC: 7.7 10*3/uL (ref 4.0–10.5)
nRBC: 0.5 % — ABNORMAL HIGH (ref 0.0–0.2)

## 2023-01-17 LAB — GLUCOSE, CAPILLARY
Glucose-Capillary: 287 mg/dL — ABNORMAL HIGH (ref 70–99)
Glucose-Capillary: 78 mg/dL (ref 70–99)
Glucose-Capillary: 79 mg/dL (ref 70–99)
Glucose-Capillary: 80 mg/dL (ref 70–99)
Glucose-Capillary: 83 mg/dL (ref 70–99)
Glucose-Capillary: 89 mg/dL (ref 70–99)
Glucose-Capillary: 93 mg/dL (ref 70–99)

## 2023-01-17 LAB — HEPATITIS B SURFACE ANTIGEN: Hepatitis B Surface Ag: NONREACTIVE

## 2023-01-17 MED ORDER — HEPARIN SODIUM (PORCINE) 1000 UNIT/ML IJ SOLN
INTRAMUSCULAR | Status: AC
  Filled 2023-01-17: qty 7

## 2023-01-17 MED ORDER — GUAIFENESIN ER 600 MG PO TB12
600.0000 mg | ORAL_TABLET | Freq: Two times a day (BID) | ORAL | Status: DC
  Administered 2023-01-17 – 2023-01-18 (×3): 600 mg via ORAL
  Filled 2023-01-17 (×3): qty 1

## 2023-01-17 MED ORDER — GLUCOSE 40 % PO GEL
1.0000 | Freq: Once | ORAL | Status: AC
  Administered 2023-01-17: 31 g via ORAL
  Filled 2023-01-17: qty 1.21

## 2023-01-17 MED ORDER — LOPERAMIDE HCL 2 MG PO CAPS
2.0000 mg | ORAL_CAPSULE | ORAL | Status: DC | PRN
  Administered 2023-01-17: 2 mg via ORAL
  Filled 2023-01-17: qty 1

## 2023-01-17 MED ORDER — HEPARIN SODIUM (PORCINE) 1000 UNIT/ML IJ SOLN: 4000.0000 [IU] | Freq: Once | INTRAMUSCULAR | Status: DC

## 2023-01-17 MED ORDER — HEPARIN SODIUM (PORCINE) 1000 UNIT/ML IJ SOLN
6200.0000 [IU] | Freq: Once | INTRAMUSCULAR | Status: AC
  Administered 2023-01-17: 6200 [IU]

## 2023-01-17 NOTE — Plan of Care (Signed)
  Problem: Education: Goal: Ability to describe self-care measures that may prevent or decrease complications (Diabetes Survival Skills Education) will improve Outcome: Progressing Goal: Individualized Educational Video(s) Outcome: Progressing   Problem: Coping: Goal: Ability to adjust to condition or change in health will improve Outcome: Progressing   Problem: Fluid Volume: Goal: Ability to maintain a balanced intake and output will improve Outcome: Progressing   Problem: Health Behavior/Discharge Planning: Goal: Ability to identify and utilize available resources and services will improve Outcome: Progressing Goal: Ability to manage health-related needs will improve Outcome: Progressing   Problem: Metabolic: Goal: Ability to maintain appropriate glucose levels will improve Outcome: Progressing   Problem: Nutritional: Goal: Maintenance of adequate nutrition will improve Outcome: Progressing Goal: Progress toward achieving an optimal weight will improve Outcome: Progressing   Problem: Skin Integrity: Goal: Risk for impaired skin integrity will decrease Outcome: Progressing   Problem: Tissue Perfusion: Goal: Adequacy of tissue perfusion will improve Outcome: Progressing   Problem: Education: Goal: Knowledge of General Education information will improve Description: Including pain rating scale, medication(s)/side effects and non-pharmacologic comfort measures Outcome: Progressing   Problem: Health Behavior/Discharge Planning: Goal: Ability to manage health-related needs will improve Outcome: Progressing   Problem: Clinical Measurements: Goal: Ability to maintain clinical measurements within normal limits will improve Outcome: Progressing Goal: Will remain free from infection Outcome: Progressing Goal: Diagnostic test results will improve Outcome: Progressing Goal: Respiratory complications will improve Outcome: Progressing Goal: Cardiovascular complication will  be avoided Outcome: Progressing   Problem: Clinical Measurements: Goal: Will remain free from infection Outcome: Progressing   Problem: Activity: Goal: Risk for activity intolerance will decrease Outcome: Progressing   Problem: Nutrition: Goal: Adequate nutrition will be maintained Outcome: Progressing   Problem: Elimination: Goal: Will not experience complications related to bowel motility Outcome: Progressing Goal: Will not experience complications related to urinary retention Outcome: Progressing   Problem: Coping: Goal: Level of anxiety will decrease Outcome: Progressing   Problem: Pain Management: Goal: General experience of comfort will improve Outcome: Progressing   Problem: Safety: Goal: Ability to remain free from injury will improve Outcome: Progressing   Problem: Skin Integrity: Goal: Risk for impaired skin integrity will decrease Outcome: Progressing

## 2023-01-17 NOTE — Progress Notes (Signed)
   01/17/23 1738  Vitals  Temp 98.1 F (36.7 C)  Pulse Rate 80  Resp 10  BP (!) 153/76  SpO2 100 %  Weight 68.7 kg  Type of Weight Post-Dialysis  Post Treatment  Dialyzer Clearance Heavily streaked  Hemodialysis Intake (mL) 0 mL  Liters Processed 54  Fluid Removed (mL) 0 mL  Tolerated HD Treatment Yes   Received patient in bed to unit.  Alert and oriented.  Informed consent signed and in chart.   TX duration:3hrs  Patient tolerated well.  Transported back to the room  Alert, without acute distress.  Hand-off given to patient's nurse.   Access used: L thigh HDC Access issues: none  Total UF removed: 0 Medication(s) given: none   [facility]

## 2023-01-17 NOTE — Progress Notes (Signed)
Patient ID: Barbara Donovan, female   DOB: 03/12/1990, 32 y.o.   MRN: [id number]   Acute Care Surgery Service Progress Note:    Chief Complaint/Subjective: No n/v with CLD yesterday No BM Did have some pain but was controlled with pain med last night  Objective: Vital signs in last 24 hours: Temp:  [97.9 F (36.6 C)-98.6 F (37 C)] 98.6 F (37 C) (12/30 0753) Pulse Rate:  [72-82] 79 (12/30 0753) Resp:  [15-18] 18 (12/30 0427) BP: (104-128)/(63-81) 128/78 (12/30 0753) SpO2:  [97 %-100 %] 97 % (12/30 0753) Last BM Date : 01/15/23  Intake/Output from previous day: 12/29 0701 - 12/30 0700 In: 544 [I.V.:260.9; IV Piggyback:283.1] Out: -  Intake/Output this shift: No intake/output data recorded.  Lungs: cta, nonlabored  Cardiovascular: reg  Abd: soft, nd, mild TTP; no rebound/peritonitis  Extremities: no edema, +SCDs  Neuro: alert, nonfocal  Lab Results: CBC  Recent Labs    01/16/23 0512 01/17/23 0722  WBC 8.0 7.7  HGB 11.9* 11.7*  HCT 38.0 37.3  PLT 243 265   BMET Recent Labs    01/16/23 0512 01/17/23 0723  NA 139 134*  K 4.5 4.6  CL 93* 91*  CO2 27 24  GLUCOSE 109* 74  BUN 45* 53*  CREATININE 12.03* 14.29*  CALCIUM 8.9 8.7*   LFT    Latest Ref Rng & Units 01/17/2023    7:23 AM 01/17/2023    7:22 AM 01/16/2023    5:12 AM  Hepatic Function  Total Protein 6.5 - 8.1 g/dL  7.5    Albumin 3.5 - 5.0 g/dL 3.6  3.6  3.7   AST 15 - 41 U/L  17    ALT 0 - 44 U/L  13    Alk Phosphatase 38 - 126 U/L  145    Total Bilirubin <1.2 mg/dL  0.8    Bilirubin, Direct 0.0 - 0.2 mg/dL  <2.9     PT/INR No results for input(s): "LABPROT", "INR" in the last 72 hours. ABG No results for input(s): "PHART", "HCO3" in the last 72 hours.  Invalid input(s): "PCO2", "PO2"  Studies/Results:  Anti-infectives: Anti-infectives (From admission, onward)    Start     Dose/Rate Route Frequency Ordered Stop   01/16/23 2200  cefTRIAXone (ROCEPHIN) 2 g in sodium chloride 0.9  % 100 mL IVPB        2 g 200 mL/hr over 30 Minutes Intravenous Every 24 hours 01/15/23 2302 01/19/23 2159   01/16/23 1000  metroNIDAZOLE (FLAGYL) IVPB 500 mg        500 mg 100 mL/hr over 60 Minutes Intravenous Every 12 hours 01/15/23 2302 01/19/23 0959   01/15/23 2215  cefTRIAXone (ROCEPHIN) 2 g in sodium chloride 0.9 % 100 mL IVPB       Placed in "And" Linked Group   2 g 200 mL/hr over 30 Minutes Intravenous  Once 01/15/23 2202 01/15/23 2254   01/15/23 2215  metroNIDAZOLE (FLAGYL) IVPB 500 mg       Placed in "And" Linked Group   500 mg 100 mL/hr over 60 Minutes Intravenous  Once 01/15/23 2202 01/15/23 2259       Medications: Scheduled Meds:  acetaminophen  1,000 mg Oral Q8H   amLODipine  10 mg Oral Daily   calcitRIOL  0.5 mcg Oral Q M,W,F-HD   carvedilol  25 mg Oral BID WC   Chlorhexidine Gluconate Cloth  6 each Topical Daily   cinacalcet  30 mg Oral Daily   guaiFENesin  600 mg Oral BID   hydrALAZINE  100 mg Oral Q8H   insulin aspart  0-5 Units Subcutaneous QHS   insulin aspart  0-9 Units Subcutaneous TID WC   insulin glargine-yfgn  10 Units Subcutaneous QHS   mirtazapine  15 mg Oral QHS   polyethylene glycol  17 g Oral Daily   sucroferric oxyhydroxide  1,500 mg Oral TID WC   Continuous Infusions:  cefTRIAXone (ROCEPHIN)  IV 2 g (01/16/23 2314)   metronidazole 500 mg (01/17/23 1107)   PRN Meds:.HYDROmorphone (DILAUDID) injection, oxyCODONE, trimethobenzamide  Assessment/Plan: Patient Active Problem List   Diagnosis Date Noted   Appendicitis with nonoperative management 01/15/2023   Displacement of vascular dialysis catheter (HCC) 11/28/2022   Pulmonary cavitary lesion 07/26/2022   Medication management 07/26/2022   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) 10/13/2021   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) 10/13/2021   Greater trochanteric pain syndrome of right lower extremity 07/23/2021   Hyperlipidemia 04/20/2021    Anxiety and depression 02/04/2021   DKA, type 1 (HCC) 11/10/2020   Type 1 diabetes mellitus with hyperglycemia (HCC) 11/10/2020   ESRD on hemodialysis (HCC) 11/10/2020   Anemia due to stage 5 chronic kidney disease (HCC) 03/04/2020   Intractable nausea and vomiting 01/22/2018   Essential hypertension 08/21/2017   Diabetic retinopathy (HCC) 06/09/2017   Asthma 10/30/2013   32 yo F with ESRD on transplant list presenting with early acute appendicitis without appendicolith  - no fever, no wbc, no tachycardia, abd exam reassuring -will cont nonop mgmt for now -cont IV abx -will adv to FLD this am  ESRD per renal  DM1 per primary team  VTE prophylaxis - ok for chemical vte from surgery point of view  Disposition:  LOS: 2 days    Mary Sella. Andrey Campanile, MD, FACS General, Bariatric, & Minimally Invasive Surgery [phone] [facility], [facility]

## 2023-01-17 NOTE — Progress Notes (Signed)
Patient ID: Barbara Donovan, female   DOB: 18-May-1990, 32 y.o.   MRN: [id number] S: Still with some abdominal pain but feeling a little better today.  Able to eat breakfast. O:BP 128/78 (BP Location: Left Arm)   Pulse 79   Temp 98.6 F (37 C) (Oral)   Resp 18   Ht 5\' 6"  (1.676 m)   Wt 66.5 kg   SpO2 97%   BMI 23.66 kg/m   Intake/Output Summary (Last 24 hours) at 01/17/2023 1034 Last data filed at 01/17/2023 0400 Gross per 24 hour  Intake 544.03 ml  Output --  Net 544.03 ml   Intake/Output: I/O last 3 completed shifts: In: 544 [I.V.:260.9; IV Piggyback:283.1] Out: -   Intake/Output this shift:  No intake/output data recorded. Weight change:  Gen:NAD CVS: RRR Resp:CTA Abd: +BS, soft, mildly tender to RLQ, no guarding or rebound. Ext:no edema, RUE AVF +T/B, left femoral TDC in place, c/d/i  Recent Labs  Lab 01/15/23 2010 01/16/23 0512 01/17/23 0722 01/17/23 0723  NA 135 139  --  134*  K 4.8 4.5  --  4.6  CL 89* 93*  --  91*  CO2 23 27  --  24  GLUCOSE 167* 109*  --  74  BUN 40* 45*  --  53*  CREATININE 11.13* 12.03*  --  14.29*  ALBUMIN 4.6 3.7 3.6 3.6  CALCIUM 10.0 8.9  --  8.7*  PHOS  --  7.6*  --  8.0*  AST 25  --  17  --   ALT 18  --  13  --    Liver Function Tests: Recent Labs  Lab 01/15/23 2010 01/16/23 0512 01/17/23 0722 01/17/23 0723  AST 25  --  17  --   ALT 18  --  13  --   ALKPHOS 191*  --  145*  --   BILITOT 1.1  --  0.8  --   PROT 8.8*  --  7.5  --   ALBUMIN 4.6 3.7 3.6 3.6   Recent Labs  Lab 01/15/23 2010  LIPASE 39   No results for input(s): "AMMONIA" in the last 168 hours. CBC: Recent Labs  Lab 01/15/23 2010 01/16/23 0512 01/17/23 0722  WBC 7.7 8.0 7.7  NEUTROABS 6.4  --   --   HGB 12.5 11.9* 11.7*  HCT 40.5 38.0 37.3  MCV 93.1 93.8 93.5  PLT 161 243 265   Cardiac Enzymes: No results for input(s): "CKTOTAL", "CKMB", "CKMBINDEX", "TROPONINI" in the last 168 hours. CBG: Recent Labs  Lab 01/16/23 1632 01/16/23 2002  01/17/23 0015 01/17/23 0425 01/17/23 0821  GLUCAP 118* 81 89 80 83    Iron Studies: No results for input(s): "IRON", "TIBC", "TRANSFERRIN", "FERRITIN" in the last 72 hours. Studies/Results: CT ABDOMEN PELVIS WO CONTRAST Result Date: 01/15/2023 CLINICAL DATA:  Acute onset abdominal pain with nausea and vomiting EXAM: CT ABDOMEN AND PELVIS WITHOUT CONTRAST TECHNIQUE: Multidetector CT imaging of the abdomen and pelvis was performed following the standard protocol without IV contrast. RADIATION DOSE REDUCTION: This exam was performed according to the departmental dose-optimization program which includes automated exposure control, adjustment of the mA and/or kV according to patient size and/or use of iterative reconstruction technique. COMPARISON:  CT abdomen and pelvis dated 07/03/2021 FINDINGS: Lower chest: Bibasilar punctate calcifications again seen. No pleural effusion or pneumothorax demonstrated. Partially imaged heart size is normal. Hepatobiliary: No focal hepatic lesions. No intra or extrahepatic biliary ductal dilation. Normal gallbladder. Pancreas: No focal lesions or main  ductal dilation. Spleen: Normal in size without focal abnormality. Adrenals/Urinary Tract: No adrenal nodules. No suspicious renal mass on this noncontrast enhanced examination, calculi or hydronephrosis. Bladder is decompressed. Stomach/Bowel: Normal appearance of the stomach. No evidence of bowel wall thickening, distention, or inflammatory changes. Small volume stool within the ascending and transverse colon. Mildly dilated retrocecal appendix measures up to 11 mm (4:52), previously 6 mm on 07/03/2021. Vascular/Lymphatic: Vascular catheter reaches the right atrium. Aortic atherosclerosis. No enlarged abdominal or pelvic lymph nodes. Reproductive: No adnexal masses. Other: Minimal periappendiceal stranding. No free air or fluid collection. Musculoskeletal: No acute or abnormal lytic or blastic osseous lesions. IMPRESSION: 1.  Mildly dilated retrocecal appendix measures up to 11 mm, previously 6 mm on 07/03/2021. Minimal periappendiceal stranding. Findings may reflect early acute appendicitis. 2.  Aortic Atherosclerosis (ICD10-I70.0). Electronically Signed   By: Agustin Cree M.D.   On: 01/15/2023 21:36    acetaminophen  1,000 mg Oral Q8H   amLODipine  10 mg Oral Daily   calcitRIOL  0.5 mcg Oral Q M,W,F-HD   carvedilol  25 mg Oral BID WC   Chlorhexidine Gluconate Cloth  6 each Topical Daily   cinacalcet  30 mg Oral Daily   guaiFENesin  600 mg Oral BID   hydrALAZINE  100 mg Oral Q8H   insulin aspart  0-5 Units Subcutaneous QHS   insulin aspart  0-9 Units Subcutaneous TID WC   insulin glargine-yfgn  10 Units Subcutaneous QHS   mirtazapine  15 mg Oral QHS   polyethylene glycol  17 g Oral Daily   sucroferric oxyhydroxide  1,500 mg Oral TID WC    BMET    Component Value Date/Time   NA 134 (L) 01/17/2023 0723   NA 142 12/21/2022 1622   K 4.6 01/17/2023 0723   CL 91 (L) 01/17/2023 0723   CO2 24 01/17/2023 0723   GLUCOSE 74 01/17/2023 0723   BUN 53 (H) 01/17/2023 0723   BUN 28 (H) 12/21/2022 1622   CREATININE 14.29 (H) 01/17/2023 0723   CALCIUM 8.7 (L) 01/17/2023 0723   GFRNONAA 3 (L) 01/17/2023 0723   GFRAA 30 (L) 09/15/2019 0358   CBC    Component Value Date/Time   WBC 7.7 01/17/2023 0722   RBC 3.99 01/17/2023 0722   HGB 11.7 (L) 01/17/2023 0722   HGB 8.8 (L) 12/21/2022 1622   HGB 13.0 07/07/2017 0000   HCT 37.3 01/17/2023 0722   HCT 27.1 (L) 12/21/2022 1622   HCT 39 07/07/2017 0000   PLT 265 01/17/2023 0722   PLT 245 12/21/2022 1622   PLT 374 07/07/2017 0000   MCV 93.5 01/17/2023 0722   MCV 94 12/21/2022 1622   MCH 29.3 01/17/2023 0722   MCHC 31.4 01/17/2023 0722   RDW 18.5 (H) 01/17/2023 0722   RDW 16.0 (H) 12/21/2022 1622   LYMPHSABS 0.7 01/15/2023 2010   MONOABS 0.5 01/15/2023 2010   EOSABS 0.0 01/15/2023 2010   BASOSABS 0.0 01/15/2023 2010    Dialysis Access: Right upper arm AV  fistula positive bruit, left femoral PermCath dressing dry intact   Dialysis Orders: Center: [facility] unit M, TU, TH, Fri, 3 hours EDW 64.1 kg, HD Bath 2K bath 45 lactate Heparin 2000 units. Access right upper arm AV fistula resting status post infiltration, using the left femoral PermCath    Taking calcitriol 0.5 mcg p.o. daily  Mircera 150 mcg last given 01/11/23   Assessment/Plan Appendicitis= surgery consulted currently on antibiotics non-surgical management currently pending clinical improvement  ESRD -will continue HD MWF (outpatient TCU) K4.5 Hypertension/volume  -initially BP 230/128 as patient was unable to take meds yesterday secondary nausea vomiting intractable pain, continued on hydralazine 100 mg 3 times daily amlodipine 10 mg Coreg 25 mg, holding ARB/ BP improved now 128/78, no excess volume on exam or chest x-ray Anemia  -Hgb 11.7 holding ESA (last given 01/11/2023 as above, Metabolic bone disease -takes calcitriol daily / calcium stable phosphorus 7.6 continue outpatient binder Velphoro with meals, noted on Sensipar 60 mg at bedtime hold until GI process stable noted last outpatient PTH [id number] Nutrition -renal/carb modified, currently on liquids secondary to appendicitis Diabetes mellitus type 1= management per admit team  Irena Cords, MD [facility]

## 2023-01-17 NOTE — Care Management Important Message (Signed)
Important Message  Patient Details  Name: Barbara Donovan MRN: [id number] Date of Birth: March 26, 1990   Important Message Given:  Yes - Medicare IM     Dorena Bodo 01/17/2023, 3:19 PM

## 2023-01-17 NOTE — Progress Notes (Signed)
Subjective Patient feels better and is pain free.  Physical exam Blood pressure 118/75, pulse 79, temperature 98.1 F (36.7 C), temperature source Oral, resp. rate 18, height 5\' 6"  (1.676 m), weight 66.5 kg, SpO2 98%.  Constitutional: lying in bed, NAD Cardiovascular: regular rate and rhythm, no m/r/g Pulmonary/Chest: normal work of breathing on room air, lungs clear to auscultation bilaterally Abdominal: soft, non-distended, mild tenderness to deep palpation in RLQ, no rebound/guarding MSK: normal bulk and tone Skin: warm and dry; no edema of extremities Psych: normal mood and behavior     Weight change:    Intake/Output Summary (Last 24 hours) at 01/17/2023 0719 Last data filed at 01/17/2023 0400 Gross per 24 hour  Intake 544.03 ml  Output --  Net 544.03 ml   Net IO Since Admission: 544.03 mL [01/17/23 0719]  Labs, images, and other studies    Latest Ref Rng & Units 01/16/2023    5:12 AM 01/15/2023    8:10 PM 12/21/2022    4:22 PM  CBC  WBC 4.0 - 10.5 K/uL 8.0  7.7  5.2   Hemoglobin 12.0 - 15.0 g/dL 95.2  84.1  8.8   Hematocrit 36.0 - 46.0 % 38.0  40.5  27.1   Platelets 150 - 400 K/uL 243  161  245        Latest Ref Rng & Units 01/16/2023    5:12 AM 01/15/2023    8:10 PM 12/21/2022    4:22 PM  BMP  Glucose 70 - 99 mg/dL 324  401  027   BUN 6 - 20 mg/dL 45  40  28   Creatinine 0.44 - 1.00 mg/dL 25.36  64.40  3.47   BUN/Creat Ratio 9 - 23   4   Sodium 135 - 145 mmol/L 139  135  142   Potassium 3.5 - 5.1 mmol/L 4.5  4.8  4.2   Chloride 98 - 111 mmol/L 93  89  96   CO2 22 - 32 mmol/L 27  23  28    Calcium 8.9 - 10.3 mg/dL 8.9  42.5  9.6      Assessment and plan Hospital day 2  Barbara Donovan is a 32 y.o. female with pertinent past medical history of ESRD on HD MTTF, type 1 diabetes, asthma, and hypertension who is presenting with 1 day history of acute onset abdominal pain associated with inability to eat and take her medications.    Principal Problem:   Appendicitis with nonoperative management Active Problems:   Essential hypertension   Type 1 diabetes mellitus with hyperglycemia (HCC)   ESRD on hemodialysis (HCC)  #Acute Appendicitis CTAP consistent with acute appendicitis. Surgery consulted and patient agreeable for non-surgical management given mild/early care - unremarkable vitals/CBC and lack of fecalith on imaging. [facility] transplant surgeon was contacted regarding how management would affect candidacy and there is no difference between surgical/non-surgical approach.  -Appreciate Surgery recommendations -IV ceftriaxone, metronidazole (day 2) with plan for po transition -Tylenol, Dilaudid, Oxycodone for pain -Tigan prn with QTc prolongation to 504 -Full Liquid Diet -Daily CBC   #ESRD Patient receives HD MTTF through femoral tunneled dialysis catheter on the left lower extremity. Proceeding with inpatient HD today. -Trend RFP -Continue home Calcitrol, Cinacalcet, Velphoro  -On [facility] transplant list   #T1DM CBG's stable -Continue SSI and Semglee 10 qhs  #Hypertension -Stable Continue home hydralazine 100 mg 3 times daily, amlodipine 10 mg  daily, Coreg 25 mg twice daily. Holding ARB for now.    Diet: Full Liquid VTE: SCDs Start: 01/15/23 2252  Code: Full  Discharge plan: Most likely tomorrow with po Abx   Barbara Miller, DO 01/17/2023, 7:19 AM  Pager: [phone] After 5pm or weekend: [phone]

## 2023-01-17 NOTE — Progress Notes (Signed)
 Pt receives out-pt HD at TCU at [facility] on Mon, Tues, Thurs, Fri. Will assist as needed.   Olivia Canter Renal Navigator [phone]

## 2023-01-18 ENCOUNTER — Other Ambulatory Visit (HOSPITAL_COMMUNITY): Payer: Self-pay

## 2023-01-18 DIAGNOSIS — K3580 Unspecified acute appendicitis: Secondary | ICD-10-CM | POA: Diagnosis not present

## 2023-01-18 DIAGNOSIS — I120 Hypertensive chronic kidney disease with stage 5 chronic kidney disease or end stage renal disease: Secondary | ICD-10-CM | POA: Diagnosis not present

## 2023-01-18 DIAGNOSIS — E1022 Type 1 diabetes mellitus with diabetic chronic kidney disease: Secondary | ICD-10-CM | POA: Diagnosis not present

## 2023-01-18 DIAGNOSIS — N186 End stage renal disease: Secondary | ICD-10-CM | POA: Diagnosis not present

## 2023-01-18 LAB — CBC
HCT: 35.3 % — ABNORMAL LOW (ref 36.0–46.0)
Hemoglobin: 11 g/dL — ABNORMAL LOW (ref 12.0–15.0)
MCH: 29.4 pg (ref 26.0–34.0)
MCHC: 31.2 g/dL (ref 30.0–36.0)
MCV: 94.4 fL (ref 80.0–100.0)
Platelets: 261 10*3/uL (ref 150–400)
RBC: 3.74 MIL/uL — ABNORMAL LOW (ref 3.87–5.11)
RDW: 18.3 % — ABNORMAL HIGH (ref 11.5–15.5)
WBC: 6.3 10*3/uL (ref 4.0–10.5)
nRBC: 0.3 % — ABNORMAL HIGH (ref 0.0–0.2)

## 2023-01-18 LAB — RENAL FUNCTION PANEL
Albumin: 3.3 g/dL — ABNORMAL LOW (ref 3.5–5.0)
Anion gap: 15 (ref 5–15)
BUN: 40 mg/dL — ABNORMAL HIGH (ref 6–20)
CO2: 23 mmol/L (ref 22–32)
Calcium: 7.9 mg/dL — ABNORMAL LOW (ref 8.9–10.3)
Chloride: 95 mmol/L — ABNORMAL LOW (ref 98–111)
Creatinine, Ser: 11.11 mg/dL — ABNORMAL HIGH (ref 0.44–1.00)
GFR, Estimated: 4 mL/min — ABNORMAL LOW (ref >60)
Glucose, Bld: 91 mg/dL (ref 70–99)
Phosphorus: 5.8 mg/dL — ABNORMAL HIGH (ref 2.5–4.6)
Potassium: 4.5 mmol/L (ref 3.5–5.1)
Sodium: 133 mmol/L — ABNORMAL LOW (ref 135–145)

## 2023-01-18 LAB — GLUCOSE, CAPILLARY
Glucose-Capillary: 96 mg/dL (ref 70–99)
Glucose-Capillary: 80 mg/dL (ref 70–99)
Glucose-Capillary: 107 mg/dL — ABNORMAL HIGH (ref 70–99)

## 2023-01-18 LAB — HEPATITIS B SURFACE ANTIBODY, QUANTITATIVE: Hep B S AB Quant (Post): 32.2 m[IU]/mL

## 2023-01-18 MED ORDER — METRONIDAZOLE 500 MG PO TABS
500.0000 mg | ORAL_TABLET | Freq: Two times a day (BID) | ORAL | Status: DC
Start: 2023-01-18 — End: 2023-01-18
  Administered 2023-01-18: 500 mg via ORAL
  Filled 2023-01-18: qty 1

## 2023-01-18 MED ORDER — AMOXICILLIN-POT CLAVULANATE 500-125 MG PO TABS
1.0000 | ORAL_TABLET | Freq: Every day | ORAL | 0 refills | Status: AC
  Filled 2023-01-18: qty 4, 4d supply, fill #0

## 2023-01-18 MED ORDER — TRESIBA FLEXTOUCH 200 UNIT/ML ~~LOC~~ SOPN: 12.0000 [IU] | PEN_INJECTOR | Freq: Every day | SUBCUTANEOUS | Status: DC

## 2023-01-18 MED ORDER — MUSCLE RUB 10-15 % EX CREA
TOPICAL_CREAM | CUTANEOUS | Status: DC | PRN
  Filled 2023-01-18: qty 85

## 2023-01-18 NOTE — Progress Notes (Addendum)
 Patient ID: Barbara Donovan, female   DOB: 06/16/1990, 32 y.o.   MRN: [id number] S: Feeling a little better this morning.  No N/V and able to eat breakfast O:BP (!) 158/93 (BP Location: Left Arm)   Pulse 88   Temp 98.5 F (36.9 C) (Oral)   Resp 17   Ht 5' 6 (1.676 m)   Wt 68.7 kg   SpO2 99%   BMI 24.45 kg/m   Intake/Output Summary (Last 24 hours) at 01/18/2023 1103 Last data filed at 01/17/2023 1738 Gross per 24 hour  Intake --  Output 0 ml  Net 0 ml   Intake/Output: I/O last 3 completed shifts: In: 544 [I.V.:260.9; IV Piggyback:283.1] Out: 0   Intake/Output this shift:  No intake/output data recorded. Weight change:  Gen: NAD CVS: RRR Resp:CTA Abd: +BS, soft, minimally tender in RLQ without guarding or rebound Ext: no edema, RUE AVF +T/B Currently using left femoral TDC due to recent infiltration of avf.  Recent Labs  Lab 01/15/23 2010 01/16/23 0512 01/17/23 0722 01/17/23 0723 01/18/23 0632  NA 135 139  --  134* 133*  K 4.8 4.5  --  4.6 4.5  CL 89* 93*  --  91* 95*  CO2 23 27  --  24 23  GLUCOSE 167* 109*  --  74 91  BUN 40* 45*  --  53* 40*  CREATININE 11.13* 12.03*  --  14.29* 11.11*  ALBUMIN  4.6 3.7 3.6 3.6 3.3*  CALCIUM  10.0 8.9  --  8.7* 7.9*  PHOS  --  7.6*  --  8.0* 5.8*  AST 25  --  17  --   --   ALT 18  --  13  --   --    Liver Function Tests: Recent Labs  Lab 01/15/23 2010 01/16/23 0512 01/17/23 0722 01/17/23 0723 01/18/23 0632  AST 25  --  17  --   --   ALT 18  --  13  --   --   ALKPHOS 191*  --  145*  --   --   BILITOT 1.1  --  0.8  --   --   PROT 8.8*  --  7.5  --   --   ALBUMIN  4.6   < > 3.6 3.6 3.3*   < > = values in this interval not displayed.   Recent Labs  Lab 01/15/23 2010  LIPASE 39   No results for input(s): AMMONIA in the last 168 hours. CBC: Recent Labs  Lab 01/15/23 2010 01/16/23 0512 01/17/23 0722 01/18/23 0632  WBC 7.7 8.0 7.7 6.3  NEUTROABS 6.4  --   --   --   HGB 12.5 11.9* 11.7* 11.0*  HCT 40.5 38.0  37.3 35.3*  MCV 93.1 93.8 93.5 94.4  PLT 161 243 265 261   Cardiac Enzymes: No results for input(s): CKTOTAL, CKMB, CKMBINDEX, TROPONINI in the last 168 hours. CBG: Recent Labs  Lab 01/17/23 1826 01/17/23 2057 01/17/23 2356 01/18/23 0358 01/18/23 0829  GLUCAP 78 287* 79 96 80    Iron Studies: No results for input(s): IRON, TIBC, TRANSFERRIN, FERRITIN in the last 72 hours. Studies/Results: No results found.  acetaminophen   1,000 mg Oral Q8H   amLODipine   10 mg Oral Daily   calcitRIOL   0.5 mcg Oral Q M,W,F-HD   carvedilol   25 mg Oral BID WC   Chlorhexidine  Gluconate Cloth  6 each Topical Daily   cinacalcet   30 mg Oral Daily   guaiFENesin   600 mg  Oral BID   hydrALAZINE   100 mg Oral Q8H   insulin  aspart  0-5 Units Subcutaneous QHS   insulin  aspart  0-9 Units Subcutaneous TID WC   insulin  glargine-yfgn  10 Units Subcutaneous QHS   metroNIDAZOLE   500 mg Oral Q12H   mirtazapine   15 mg Oral QHS   polyethylene glycol  17 g Oral Daily   sucroferric oxyhydroxide  1,500 mg Oral TID WC    BMET    Component Value Date/Time   NA 133 (L) 01/18/2023 0632   NA 142 12/21/2022 1622   K 4.5 01/18/2023 0632   CL 95 (L) 01/18/2023 0632   CO2 23 01/18/2023 0632   GLUCOSE 91 01/18/2023 0632   BUN 40 (H) 01/18/2023 0632   BUN 28 (H) 12/21/2022 1622   CREATININE 11.11 (H) 01/18/2023 0632   CALCIUM  7.9 (L) 01/18/2023 0632   GFRNONAA 4 (L) 01/18/2023 0632   GFRAA 30 (L) 09/15/2019 0358   CBC    Component Value Date/Time   WBC 6.3 01/18/2023 0632   RBC 3.74 (L) 01/18/2023 0632   HGB 11.0 (L) 01/18/2023 0632   HGB 8.8 (L) 12/21/2022 1622   HGB 13.0 07/07/2017 0000   HCT 35.3 (L) 01/18/2023 0632   HCT 27.1 (L) 12/21/2022 1622   HCT 39 07/07/2017 0000   PLT 261 01/18/2023 0632   PLT 245 12/21/2022 1622   PLT 374 07/07/2017 0000   MCV 94.4 01/18/2023 0632   MCV 94 12/21/2022 1622   MCH 29.4 01/18/2023 0632   MCHC 31.2 01/18/2023 0632   RDW 18.3 (H) 01/18/2023 0632    RDW 16.0 (H) 12/21/2022 1622   LYMPHSABS 0.7 01/15/2023 2010   MONOABS 0.5 01/15/2023 2010   EOSABS 0.0 01/15/2023 2010   BASOSABS 0.0 01/15/2023 2010    Dialysis Access: Right upper arm AV fistula positive bruit, left femoral PermCath dressing dry intact   Dialysis Orders: Center: [facility] unit M, TU, TH, Fri, 3 hours EDW 64.1 kg, HD Bath 2K bath 45 lactate Heparin  2000 units. Access right upper arm AV fistula resting status post infiltration, using the left femoral PermCath    Taking calcitriol  0.5 mcg p.o. daily  Mircera 150 mcg last given 01/11/23   Assessment/Plan Appendicitis= surgery consulted currently on antibiotics non-surgical management currently pending clinical improvement ESRD -will continue HD MWF (outpatient TCU) K4.5 Hypertension/volume  -initially BP 230/128 as patient was unable to take meds yesterday secondary nausea vomiting intractable pain, continued on hydralazine  100 mg 3 times daily amlodipine  10 mg Coreg  25 mg, holding ARB/ BP improved now 128/78, no excess volume on exam or chest x-ray Anemia  -Hgb 11.7 holding ESA (last given 01/11/2023 as above, Metabolic bone disease -takes calcitriol  daily / calcium  stable phosphorus 7.6 continue outpatient binder Velphoro  with meals, noted on Sensipar  60 mg at bedtime hold until GI process stable noted last outpatient PTH 2456 Nutrition -renal/carb modified, currently on liquids secondary to appendicitis Diabetes mellitus type 1= management per admit team Disposition - if to be discharged today, she will follow up with HD on Thurs and Friday this week at her home unit.  Fairy RONAL Sellar, MD [facility]

## 2023-01-18 NOTE — Discharge Instructions (Addendum)
 Please see instructions regarding follow-up appointments.  Please take the antibiotic Augmentin  each night for the next four nights (starting tonight).  Please reduce home Tresiba  to 12 units daily until follow-up with PCP or Endocrinology.  Please return to ED if you begin to experience worsening abdominal pain.

## 2023-01-18 NOTE — Progress Notes (Signed)
Discharge instructions given. Patient verbalized understanding and all questions were answered.  ?

## 2023-01-18 NOTE — Progress Notes (Signed)
 Patient ID: Barbara Donovan, female   DOB: [birth year], 32 y.o.   MRN: [id number]      Subjective: Much less RLQ pain. Eating. ROS negative except as listed above. Objective: Vital signs in last 24 hours: Temp:  [98.1 F (36.7 C)-98.5 F (36.9 C)] 98.5 F (36.9 C) (12/31 0739) Pulse Rate:  [73-88] 88 (12/31 0739) Resp:  [8-18] 17 (12/31 0739) BP: (126-158)/(72-93) 158/93 (12/31 0739) SpO2:  [99 %-100 %] 99 % (12/31 0739) Weight:  [68.7 kg] 68.7 kg (12/30 1738) Last BM Date : 01/17/23  Intake/Output from previous day: No intake/output data recorded. Intake/Output this shift: No intake/output data recorded.  General appearance: alert and cooperative Resp: clear to auscultation bilaterally GI: soft, minimal RLQ tenderness  Lab Results: CBC  Recent Labs    01/17/23 0722 01/18/23 0632  WBC 7.7 6.3  HGB 11.7* 11.0*  HCT 37.3 35.3*  PLT 265 261   BMET Recent Labs    01/17/23 0723 01/18/23 0632  NA 134* 133*  K 4.6 4.5  CL 91* 95*  CO2 24 23  GLUCOSE 74 91  BUN 53* 40*  CREATININE 14.29* 11.11*  CALCIUM  8.7* 7.9*   PT/INR No results for input(s): LABPROT, INR in the last 72 hours. ABG No results for input(s): PHART, HCO3 in the last 72 hours.  Invalid input(s): PCO2, PO2  Studies/Results: No results found.  Anti-infectives: Anti-infectives (From admission, onward)    Start     Dose/Rate Route Frequency Ordered Stop   01/18/23 1000  metroNIDAZOLE  (FLAGYL ) tablet 500 mg        500 mg Oral Every 12 hours 01/18/23 0639 01/19/23 0959   01/16/23 2200  cefTRIAXone  (ROCEPHIN ) 2 g in sodium chloride  0.9 % 100 mL IVPB        2 g 200 mL/hr over 30 Minutes Intravenous Every 24 hours 01/15/23 2302 01/19/23 2159   01/16/23 1000  metroNIDAZOLE  (FLAGYL ) IVPB 500 mg  Status:  Discontinued        500 mg 100 mL/hr over 60 Minutes Intravenous Every 12 hours 01/15/23 2302 01/18/23 0639   01/15/23 2215  cefTRIAXone  (ROCEPHIN ) 2 g in sodium chloride  0.9 % 100 mL  IVPB       Placed in And Linked Group   2 g 200 mL/hr over 30 Minutes Intravenous  Once 01/15/23 2202 01/15/23 2254   01/15/23 2215  metroNIDAZOLE  (FLAGYL ) IVPB 500 mg       Placed in And Linked Group   500 mg 100 mL/hr over 60 Minutes Intravenous  Once 01/15/23 2202 01/15/23 2259       Assessment/Plan: 32 yo F with ESRD on transplant list presenting with early acute appendicitis without appendicolith  - no fever, no wbc, no tachycardia, abd exam reassuring -will cont nonop mgmt for now -changing to PO augmentin   ESRD per renal  DM1 per primary team  VTE prophylaxis - ok for chemical vte from surgery point of view  Disposition: anticipate home today. SHe may F/U with Dr. Paola at [facility] in 2 weeks.   LOS: 3 days    Dann Hummer, MD, MPH, FACS Trauma & General Surgery Use [email or web address] to contact on call provider  01/18/2023

## 2023-01-18 NOTE — Progress Notes (Signed)
Contacted by attending regarding pt's being d/c today. Contacted TCU RN to be advised that pt will d/c today and should resume care on Thursday.   Olivia Canter Renal Navigator [phone]

## 2023-01-18 NOTE — Discharge Summary (Signed)
 Name: Barbara Donovan MRN: [id number] DOB: 07/05/1990 32 y.o. PCP: Kandis Perkins, DO  Date of Admission: 01/15/2023  7:34 PM Date of Discharge:  01/18/2023 Attending Physician: Dr. Trudy  DISCHARGE DIAGNOSIS:  Primary Problem: Appendicitis with nonoperative management   Hospital Problems: Principal Problem:   Appendicitis with nonoperative management Active Problems:   Essential hypertension   Type 1 diabetes mellitus with hyperglycemia (HCC)   ESRD on hemodialysis (HCC)    DISCHARGE MEDICATIONS:   Allergies as of 01/18/2023   No Known Allergies      Medication List     PAUSE taking these medications    insulin  lispro 100 UNIT/ML KwikPen Wait to take this until your doctor or other care provider tells you to start again. Commonly known as: HumaLOG  KwikPen Max daily 40 units       STOP taking these medications    ondansetron  8 MG disintegrating tablet Commonly known as: ZOFRAN -ODT       TAKE these medications    acetaminophen  325 MG tablet Commonly known as: TYLENOL  Take 650 mg by mouth every 6 (six) hours as needed for mild pain.   amLODipine  10 MG tablet Commonly known as: NORVASC  Take 1 tablet (10 mg total) by mouth daily. What changed: when to take this   amoxicillin -clavulanate 500-125 MG tablet Commonly known as: Augmentin  Take 1 tablet by mouth at bedtime for 4 days.   calcitRIOL  0.5 MCG capsule Commonly known as: ROCALTROL  Take 0.5 mcg by mouth 4 (four) times a week. Monday, Tuesday, Thursday, and Friday   carvedilol  25 MG tablet Commonly known as: COREG  Take 1 tablet (25 mg total) by mouth 2 (two) times daily.   ethyl chloride spray Apply 1 Application topically 4 (four) times a week. Apply to arm on Monday, Tuesday, Thursday, and Friday   hydrALAZINE  100 MG tablet Commonly known as: APRESOLINE  Take 100 mg by mouth 2 (two) times daily.   lidocaine  4 % cream Commonly known as: LMX Apply 1 Application topically once a week.  Apply on Monday,Tuesday, Thursday, and Friday   losartan  100 MG tablet Commonly known as: COZAAR  Take 100 mg by mouth at bedtime.   metoCLOPramide  10 MG tablet Commonly known as: REGLAN  Take 1 tablet (10 mg total) by mouth every 6 (six) hours. What changed:  when to take this reasons to take this   mirtazapine  15 MG tablet Commonly known as: REMERON  Take 1 tablet (15 mg total) by mouth at bedtime. Do not take medicine in the morning and drive. What changed: additional instructions   multivitamin Tabs tablet Take 1 tablet by mouth at bedtime.   Sensipar  60 MG tablet Generic drug: cinacalcet  Take 60 mg by mouth at bedtime.   Tresiba  FlexTouch 200 UNIT/ML FlexTouch Pen Generic drug: insulin  degludec Inject 12 Units into the skin daily in the afternoon. What changed: how much to take   Velphoro  500 MG chewable tablet Generic drug: sucroferric oxyhydroxide Chew 1,500 mg by mouth 3 (three) times daily with meals.        DISPOSITION AND FOLLOW-UP:  [name] was discharged from [facility] in Stable condition. At the hospital follow-up visit please address:  #Acute Appendicitis Ensure completion of Augmentin  and resolution of symptoms -Consider CBC   #ESRD MTTF outpatient HD  #T1DM Home Tresiba  decreased to 12 units on discharge. -Assess glycemic needs   Follow-up Appointments:  Follow-up Information     Paola Dreama SAILOR, MD Follow up in 2 week(s).   Specialty:  Surgery Contact information: [address] [place] [address] [facility] [place] [facility] [place] [address] [phone]         Francella Rogue, MD. Go on 01/28/2023.   Specialty: Internal Medicine Why: Hospital follow up appt on 1/10 at 10:45 AM Contact information: [address] [place] [address] [phone]         Shamleffer, Donell Cardinal, MD. Schedule an appointment as soon as possible for a visit.   Specialties: Endocrinology, Radiology Why: Please  schedule closer follow up appt with yoru endocrinologist given you were in the hospital. Contact information: [address] [place] [address] [phone]                 HOSPITAL COURSE:  Patient Summary:  Barbara Donovan is a 32 year old female with PMH of ESRD on HD MTTD, T1DM, asthma, and hypertension who presented to ED 12/28 with 1 day history of acute onset abdominal pain associated with inability to eat and take her medications. Subsequent CTAP revealed acute appendicitis. Surgery consulted and recommended non-operative management to which patient agreed. Started on IV Flagyl  and Rocephin . During admission patient with improvement in symptoms and was discharged with Augmentin  to complete 7 day course.  During admission patient received inpatient HD per outpatient schedule without complication.  Patient's Tresiba  was decreased from 17 units to 12 units daily for better glycemic control.    SUBJECTIVE:  Patient feels comfortable with discharge. Endorses mild shoulder pain. Discharge Vitals:   BP (!) 158/93 (BP Location: Left Arm)   Pulse 88   Temp 98.5 F (36.9 C) (Oral)   Resp 17   Ht 5' 6 (1.676 m)   Wt 68.7 kg   SpO2 99%   BMI 24.45 kg/m   OBJECTIVE:   Physical Exam Constitutional: lying in bed, NAD Cardiovascular: regular rate and rhythm, no m/r/g Pulmonary/Chest: normal work of breathing on room air, lungs clear to auscultation bilaterally Abdominal: soft, non-distended, mild tenderness to deep palpation in RLQ, no rebound/guarding MSK: left sided trapezius muscle with tenderness to palpation Skin: warm and dry; no edema of extremities  Pertinent Labs, Studies, and Procedures:     Latest Ref Rng & Units 01/18/2023    6:32 AM 01/17/2023    7:22 AM 01/16/2023    5:12 AM  CBC  WBC 4.0 - 10.5 K/uL 6.3  7.7  8.0   Hemoglobin 12.0 - 15.0 g/dL 88.9  88.2  88.0   Hematocrit 36.0 - 46.0 % 35.3  37.3  38.0   Platelets 150 - 400 K/uL 261  265   243        Latest Ref Rng & Units 01/18/2023    6:32 AM 01/17/2023    7:23 AM 01/17/2023    7:22 AM  CMP  Glucose 70 - 99 mg/dL 91  74    BUN 6 - 20 mg/dL 40  53    Creatinine 9.55 - 1.00 mg/dL 88.88  85.70    Sodium 135 - 145 mmol/L 133  134    Potassium 3.5 - 5.1 mmol/L 4.5  4.6    Chloride 98 - 111 mmol/L 95  91    CO2 22 - 32 mmol/L 23  24    Calcium  8.9 - 10.3 mg/dL 7.9  8.7    Total Protein 6.5 - 8.1 g/dL   7.5   Total Bilirubin <1.2 mg/dL   0.8   Alkaline Phos 38 - 126 U/L   145   AST 15 -  41 U/L   17   ALT 0 - 44 U/L   13     CT ABDOMEN PELVIS WO CONTRAST Result Date: 01/15/2023 CLINICAL DATA:  Acute onset abdominal pain with nausea and vomiting EXAM: CT ABDOMEN AND PELVIS WITHOUT CONTRAST TECHNIQUE: Multidetector CT imaging of the abdomen and pelvis was performed following the standard protocol without IV contrast. RADIATION DOSE REDUCTION: This exam was performed according to the departmental dose-optimization program which includes automated exposure control, adjustment of the mA and/or kV according to patient size and/or use of iterative reconstruction technique. COMPARISON:  CT abdomen and pelvis dated 07/03/2021 FINDINGS: Lower chest: Bibasilar punctate calcifications again seen. No pleural effusion or pneumothorax demonstrated. Partially imaged heart size is normal. Hepatobiliary: No focal hepatic lesions. No intra or extrahepatic biliary ductal dilation. Normal gallbladder. Pancreas: No focal lesions or main ductal dilation. Spleen: Normal in size without focal abnormality. Adrenals/Urinary Tract: No adrenal nodules. No suspicious renal mass on this noncontrast enhanced examination, calculi or hydronephrosis. Bladder is decompressed. Stomach/Bowel: Normal appearance of the stomach. No evidence of bowel wall thickening, distention, or inflammatory changes. Small volume stool within the ascending and transverse colon. Mildly dilated retrocecal appendix measures up to 11 mm  (4:52), previously 6 mm on 07/03/2021. Vascular/Lymphatic: Vascular catheter reaches the right atrium. Aortic atherosclerosis. No enlarged abdominal or pelvic lymph nodes. Reproductive: No adnexal masses. Other: Minimal periappendiceal stranding. No free air or fluid collection. Musculoskeletal: No acute or abnormal lytic or blastic osseous lesions. IMPRESSION: 1. Mildly dilated retrocecal appendix measures up to 11 mm, previously 6 mm on 07/03/2021. Minimal periappendiceal stranding. Findings may reflect early acute appendicitis. 2.  Aortic Atherosclerosis (ICD10-I70.0). Electronically Signed   By: Limin  Xu M.D.   On: 01/15/2023 21:36     Signed: Norman Lobstein, DO  Internal Medicine Resident, PGY-1 [facility]  Pager: [phone] 12:38 PM, 01/18/2023

## 2023-01-19 NOTE — Discharge Planning (Signed)
 [facility] Patient Discharge Orders- [facility] CLINIC: [facility]  Patient's name: Barbara Donovan Admit/DC Dates: 01/15/2023 - 01/18/2023  Discharge Diagnoses: Appendicitis with nonoperative management     Aranesp : Given: no   Date and amount of last dose: N/A  Last Hgb: 11.0 PRBC's Given: no Date/# of units: N/A ESA dose for discharge: mircera 150 mcg IV q 2 weeks  IV Iron dose at discharge: none  Heparin  change: no  EDW Change: no New EDW:   Bath Change: no  Access intervention/Change: no (restive AVF still) Details:  Hectorol/Calcitriol  change: no  Discharge Labs: Calcium  7.9 Phosphorus 5.8 Albumin  3.3 K+ 4.6   IV Antibiotics: no (on oral augmentin ) Details:  On Coumadin?: no Last INR: Next INR: Managed By:   OTHER/APPTS/LAB ORDERS:    D/C Meds to be reconciled by nurse after every discharge.  Completed By: Lucie Collet, PA-C 01/19/2023, 12:15 PM  [facility] Pager: [phone]   Reviewed by: MD:______ RN_______

## 2023-01-20 ENCOUNTER — Telehealth: Payer: Self-pay | Admitting: Physician Assistant

## 2023-01-20 NOTE — Telephone Encounter (Signed)
 Transition of care contact from inpatient facility  Date of Discharge:  Date of Contact: Method of contact: Phone  Attempted to contact patient to discuss transition of care from inpatient admission. Patient did not answer the phone. Message was left on the patient's voicemail with call back instructions.

## 2023-01-28 ENCOUNTER — Encounter: Payer: Medicare Other | Admitting: Internal Medicine

## 2023-02-02 ENCOUNTER — Ambulatory Visit (INDEPENDENT_AMBULATORY_CARE_PROVIDER_SITE_OTHER): Payer: Medicare (Managed Care) | Admitting: Student

## 2023-02-02 VITALS — BP 147/81 | HR 89 | Temp 98.2°F | Ht 66.0 in | Wt 151.8 lb

## 2023-02-02 DIAGNOSIS — G8929 Other chronic pain: Secondary | ICD-10-CM

## 2023-02-02 DIAGNOSIS — I10 Essential (primary) hypertension: Secondary | ICD-10-CM

## 2023-02-02 DIAGNOSIS — N186 End stage renal disease: Secondary | ICD-10-CM | POA: Diagnosis not present

## 2023-02-02 DIAGNOSIS — I120 Hypertensive chronic kidney disease with stage 5 chronic kidney disease or end stage renal disease: Secondary | ICD-10-CM | POA: Diagnosis not present

## 2023-02-02 DIAGNOSIS — K37 Unspecified appendicitis: Secondary | ICD-10-CM | POA: Diagnosis not present

## 2023-02-02 DIAGNOSIS — Z992 Dependence on renal dialysis: Secondary | ICD-10-CM

## 2023-02-02 DIAGNOSIS — M546 Pain in thoracic spine: Secondary | ICD-10-CM

## 2023-02-02 NOTE — Assessment & Plan Note (Addendum)
 Going to dialysis 4 days a week, they are able to use her right AV fistula for access.  She does also have a left femoral dialysis catheter in place.  No issues with dialysis at this time.  She is very motivated to get a kidney transplant, and is working to get back on the transplant list with [facility].

## 2023-02-02 NOTE — Assessment & Plan Note (Signed)
 Off the transplant list for now due to appendicitis which was successfully treated nonoperatively.  Patient denies any ongoing symptoms such as abdominal pain, nausea, vomiting, fevers, or chills.  Transplant team requesting radiological workup to confirm complete resolution of appendicitis. Plan: CT abdomen pelvis without contrast (ESRD)

## 2023-02-02 NOTE — Progress Notes (Signed)
[facility] - Clinic Note  02/09/2023     CHIEF COMPLAINT Patient presents for Retina Follow Up and Retina Evaluation   HISTORY OF PRESENT ILLNESS: Barbara Donovan is a 33 y.o. female who presents to the clinic today for:   HPI     Retina Follow Up   Patient presents with  Diabetic Retinopathy.  In both eyes.  This started 3 years ago.  I, the attending physician,  performed the HPI with the patient and updated documentation appropriately.        Retina Evaluation   In both eyes.  This started 3 years ago.  Duration of 3 years.  I, the attending physician,  performed the HPI with the patient and updated documentation appropriately.        Comments   Patient here for Retina Evaluation last here 3 years ago. Patient states vision poor. May be a little worse. No eye pain. Has end stage renal disease.      Last edited by Rennis Chris, MD on 02/09/2023 11:16 AM.    Patient states that she has end stage renal disease. She is doing dialysis Mon, Tue, Thu, Fri. She is doing dialysis at home. She feels the vision is poor out of the left eye and the vision is blurry.   Referring physician: Faith Rogue, DO [address] [place],  [place] [address]  HISTORICAL INFORMATION:   Selected notes from the MEDICAL RECORD NUMBER Referred by Dr. Lavone Neri for concern of bilateral CRVO   CURRENT MEDICATIONS: Current Outpatient Medications (Ophthalmic Drugs)  Medication Sig   prednisoLONE acetate (PRED FORTE) 1 % ophthalmic suspension Place 1 drop into the left eye 4 (four) times daily for 7 days.   No current facility-administered medications for this visit. (Ophthalmic Drugs)   Current Outpatient Medications (Other)  Medication Sig   acetaminophen (TYLENOL) 325 MG tablet Take 650 mg by mouth every 6 (six) hours as needed for mild pain.   amLODipine (NORVASC) 10 MG tablet Take 1 tablet (10 mg total) by mouth daily. (Patient taking differently: Take 10 mg by mouth at  bedtime.)   calcitRIOL (ROCALTROL) 0.5 MCG capsule Take 0.5 mcg by mouth 4 (four) times a week. Monday, Tuesday, Thursday, and Friday   carvedilol (COREG) 25 MG tablet Take 1 tablet (25 mg total) by mouth 2 (two) times daily.   ethyl chloride spray Apply 1 Application topically 4 (four) times a week. Apply to arm on Monday, Tuesday, Thursday, and Friday   hydrALAZINE (APRESOLINE) 100 MG tablet Take 100 mg by mouth 2 (two) times daily.   insulin degludec (TRESIBA FLEXTOUCH) 200 UNIT/ML FlexTouch Pen Inject 12 Units into the skin daily in the afternoon.   lidocaine (LMX) 4 % cream Apply 1 Application topically once a week. Apply on Monday,Tuesday, Thursday, and Friday   losartan (COZAAR) 100 MG tablet Take 100 mg by mouth at bedtime.   metoCLOPramide (REGLAN) 10 MG tablet Take 1 tablet (10 mg total) by mouth every 6 (six) hours. (Patient taking differently: Take 10 mg by mouth every 6 (six) hours as needed for nausea or vomiting.)   mirtazapine (REMERON) 15 MG tablet Take 1 tablet (15 mg total) by mouth at bedtime. Do not take medicine in the morning and drive. (Patient taking differently: Take 15 mg by mouth at bedtime.)   multivitamin (RENA-VIT) TABS tablet Take 1 tablet by mouth at bedtime.   SENSIPAR 60 MG tablet Take 60 mg by mouth at bedtime.  sucroferric oxyhydroxide (VELPHORO) 500 MG chewable tablet Chew 1,500 mg by mouth 3 (three) times daily with meals.   [Paused] insulin lispro (HUMALOG KWIKPEN) 100 UNIT/ML KwikPen Max daily 40 units (Patient taking differently: Inject 6 Units into the skin 3 (three) times daily with meals. Max daily 40 units)   No current facility-administered medications for this visit. (Other)   REVIEW OF SYSTEMS: ROS   Positive for: Neurological, Genitourinary, Endocrine, Eyes, Respiratory Negative for: Constitutional, Gastrointestinal, Skin, Musculoskeletal, HENT, Cardiovascular, Psychiatric, Allergic/Imm, Heme/Lymph Last edited by Laddie Aquas, COA on  02/09/2023  8:14 AM.     ALLERGIES No Known Allergies  PAST MEDICAL HISTORY Past Medical History:  Diagnosis Date   Anxiety    Asthma    as a child, no problems as an adult, no inhaler   Cataract    NS OU   Chronic hypertension during pregnancy, antepartum 08/19/2017   Dehydration 01/28/2018   Depression    Depression during pregnancy, antepartum 07/07/2017   6/20: Short trial of zoloft previously, reports didn't help much but also didn't give it a chance Discussed r/b/a SSRIs in pregnancy, agrees to try Zoloft again, rx sent No SI/HI/red flags   Diabetes (HCC)    TYPE I. 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 A2/B GDM)  Required Referrals for A1GDM or A2GDM: [x]  Diabetes Education and Testing Supplies [x]  Nutrition Cousult  For A2/B GDM or higher classes of DM [x]  Diabetes Education and Testing Supplies [x]  Nutrition Counsult [x]  Fetal ECHO after 20 weeks  [x]  Eye exam for retina evaluation - severe retinopathy 7/19  Base   Ventricular septal defect (VSD) of fetus in singleton pregnancy, antepartum 09/30/2017   May go to newborn nursery per Dr. [place] Bing Echo prior to discharge   Past Surgical History:  Procedure Laterality Date   25 GAUGE PARS PLANA VITRECTOMY WITH 20 GAUGE MVR PORT FOR MACULAR HOLE Left 07/20/2018   Procedure: 25 GAUGE PARS PLANA  VITRECTOMY LEFT EYE;  Surgeon: Rennis Chris, MD;  Location: [facility] OR;  Service: Ophthalmology;  Laterality: Left;   AV FISTULA PLACEMENT Left 08/11/2021   Procedure: INSERTION OF LEFT ARM ARTERIOVENOUS (AV) GORE-TEX GRAFT;  Surgeon: Victorino Sparrow, MD;  Location: [facility] OR;  Service: Vascular;  Laterality: Left;  PERIPHERAL NERVE BLOCK   AV FISTULA PLACEMENT Right 08/24/2022   Procedure: RIGHT UPPER EXTREMITY ARTERIOVENOUS (AV) FISTULA CREATION;  Surgeon: Chuck Hint, MD;  Location: [facility] OR;  Service: Vascular;  Laterality: Right;   AVGG REMOVAL Left 06/22/2022   Procedure: REMOVAL OF ARTERIOVENOUS GORETEX GRAFT (AVGG) LEFT ARM WITH VEIN PATCH OF BRACHIAL ARTERY;  Surgeon: Chuck Hint, MD;  Location: [facility] OR;  Service: Vascular;  Laterality: Left;   BASCILIC VEIN TRANSPOSITION Right 11/29/2022   Procedure: RIGHT ARM BASILIC VEIN TRANSPOSITION;  Surgeon: Victorino Sparrow, MD;  Location: [facility] OR;  Service: Vascular;  Laterality: Right;   CAPD INSERTION N/A 09/10/2020   Procedure: LAPAROSCOPIC INSERTION CONTINUOUS AMBULATORY PERITONEAL DIALYSIS  (CAPD) CATHETER;  Surgeon: Nada Libman, MD;  Location: [facility] OR;  Service: Vascular;  Laterality: N/A;   CESAREAN SECTION N/A 12/26/2017   Procedure: CESAREAN SECTION;  Surgeon: Tereso Newcomer, MD;  Location: [facility];  Service: Obstetrics;  Laterality: N/A;   EYE EXAMINATION UNDER ANESTHESIA Right 07/20/2018   Procedure: Eye Exam Under Anesthesia RIGHT EYE;  Surgeon: Rennis Chris, MD;  Location: [facility] OR;  Service: Ophthalmology;  Laterality: Right;   EYE SURGERY Left 07/2018   GAS INSERTION Left 07/19/2019   Procedure: INSERTION OF GAS;  Surgeon: Rennis Chris, MD;  Location: [facility] OR;  Service: Ophthalmology;  Laterality: Left;  SF6   INJECTION OF SILICONE OIL Left 07/20/2018   Procedure: Injection Of Silicone Oil LEFT EYE;  Surgeon: Rennis Chris, MD;  Location: [facility] OR;  Service: Ophthalmology;  Laterality: Left;   INSERTION OF DIALYSIS CATHETER  Right 07/05/2021   Procedure: REMOVAL OF DIALYSIS CATHETER;  Surgeon: Leonie Douglas, MD;  Location: [facility] OR;  Service: Vascular;  Laterality: Right;   INSERTION OF DIALYSIS CATHETER Right 07/09/2021   Procedure: INSERTION OF TUNNELED DIALYSIS CATHETER;  Surgeon: Leonie Douglas, MD;  Location: [facility] OR;  Service: Vascular;  Laterality: Right;   INSERTION OF DIALYSIS CATHETER Left 07/09/2021   Procedure: INSERTION OF TUNNELED  DIALYSIS CATHETER;  Surgeon: Leonie Douglas, MD;  Location: [facility] OR;  Service: Vascular;  Laterality: Left;   INSERTION OF DIALYSIS CATHETER N/A 06/22/2022   Procedure: INSERTION OF LEFT INTERNAL JUGULAR TRIALYSIS DIALYSIS CATHETER;  Surgeon: Chuck Hint, MD;  Location: [facility] OR;  Service: Vascular;  Laterality: N/A;   INSERTION OF DIALYSIS CATHETER Left 06/28/2022   Procedure: ULTRASOUND GUIDED INSERTION OF TUNNELED DIALYSIS CATHETER;  Surgeon: Leonie Douglas, MD;  Location: [facility] OR;  Service: Vascular;  Laterality: Left;   INSERTION OF DIALYSIS CATHETER Left 11/29/2022   Procedure: INSERTION OF TUNNELED DIALYSIS CATHETER TO LEFT FEMORAL VEIN;  Surgeon: Victorino Sparrow, MD;  Location: [facility] OR;  Service: Vascular;  Laterality: Left;   IR FLUORO GUIDE CV LINE RIGHT  06/02/2020   IR US GUIDE VASC ACCESS RIGHT  06/02/2020   LASER PHOTO ABLATION Right 07/20/2018   Procedure: Laser Photo Ablation RIGHT EYE;  Surgeon: Rennis Chris, MD;  Location: [facility] OR;  Service: Ophthalmology;  Laterality: Right;   MEMBRANE PEEL Left 07/20/2018   Procedure: Membrane Peel LEFT EYE;  Surgeon: Rennis Chris, MD;  Location: [facility] OR;  Service: Ophthalmology;  Laterality: Left;   MEMBRANE PEEL Left 07/19/2019   Procedure: MEMBRANE PEEL;  Surgeon: Rennis Chris, MD;  Location: [facility] OR;  Service: Ophthalmology;  Laterality: Left;   MITOMYCIN C APPLICATION Bilateral 07/20/2018   Procedure: Avastin Application;  Surgeon: Rennis Chris, MD;  Location: [facility] OR;  Service: Ophthalmology;  Laterality: Bilateral;    PHOTOCOAGULATION WITH LASER Left 07/20/2018   Procedure: Photocoagulation With Laser LEFT EYE;  Surgeon: Rennis Chris, MD;  Location: [facility] OR;  Service: Ophthalmology;  Laterality: Left;   PHOTOCOAGULATION WITH LASER Left 07/19/2019   Procedure: PHOTOCOAGULATION WITH LASER;  Surgeon: Rennis Chris, MD;  Location: [facility] OR;  Service: Ophthalmology;  Laterality: Left;   RETINAL DETACHMENT SURGERY Left 07/20/2018   Dr. Rennis Chris   SILICON OIL REMOVAL Left 07/19/2019   Procedure: 25g PARS PLANA VITRECTOMY WITH SILICON OIL REMOVAL;  Surgeon: Rennis Chris, MD;  Location: [facility] OR;  Service: Ophthalmology;  Laterality: Left;  TEE WITHOUT CARDIOVERSION N/A 07/08/2021   Procedure: TRANSESOPHAGEAL ECHOCARDIOGRAM (TEE);  Surgeon: Little Ishikawa, MD;  Location: [facility] ENDOSCOPY;  Service: Cardiovascular;  Laterality: N/A;   TEE WITHOUT CARDIOVERSION N/A 06/29/2022   Procedure: TRANSESOPHAGEAL ECHOCARDIOGRAM;  Surgeon: Wendall Stade, MD;  Location: [facility] INVASIVE CV LAB;  Service: Cardiovascular;  Laterality: N/A;   WISDOM TOOTH EXTRACTION     FAMILY HISTORY Family History  Problem Relation Age of Onset   Diabetes Mother    Aneurysm Mother 15   Seizures Mother    Diabetes Father    Cataracts Father    COPD Father    Heart attack Father    Heart disease Father    Healthy Sister    Healthy Daughter    Stroke Maternal Grandfather    Amblyopia Neg Hx    Blindness Neg Hx    Glaucoma Neg Hx    Macular degeneration Neg Hx    Retinal detachment Neg Hx    Strabismus Neg Hx    Retinitis pigmentosa Neg Hx    Colon cancer Neg Hx    Stomach cancer Neg Hx    Esophageal cancer Neg Hx    Pancreatic cancer Neg Hx    Liver disease Neg Hx    SOCIAL HISTORY Social History   Tobacco Use   Smoking status: Never    Passive exposure: Never   Smokeless tobacco: Never  Vaping Use   Vaping status: Never Used  Substance Use Topics   Alcohol use: Not Currently   Drug use: Never       OPHTHALMIC EXAM:  Base  Eye Exam     Visual Acuity (Snellen - Linear)       Right Left   Dist cc 20/50 20/200   Dist ph cc 20/20 NI    Correction: Glasses  OS looking to the side        Tonometry (Tonopen, 8:07 AM)       Right Left   Pressure 22 22         Pupils       Dark Light Shape React APD   Right 3 2 Round Brisk None   Left 3 2 Irregular Minimal None         Visual Fields (Counting fingers)       Left Right     Full   Restrictions Total superior temporal, superior nasal, inferior nasal deficiencies; Partial inner inferior temporal deficiency          Extraocular Movement       Right Left    Full XT    -- -- --  --  --  -- -- --   -- -- --  --  --  -- -- --           Neuro/Psych     Oriented x3: Yes   Mood/Affect: Normal         Dilation     Both eyes: 1.0% Mydriacyl, 2.5% Phenylephrine @ 8:06 AM           Slit Lamp and Fundus Exam     Slit Lamp Exam       Right Left   Lids/Lashes Normal Meibomian gland dysfunction   Conjunctiva/Sclera mild melanosis mild melanosis   Cornea Clear Well healed cataract wounds   Anterior Chamber Deep and clear Deep and clear   Iris Round and dilated, No NVI Round and dilated, No NVI   Lens Trace Posterior subcapsular cataract PCIOL in good position;  2-3+ PCO   Anterior Vitreous Vitreous syneresis, diffuse RBC -- vastly improved, VH clearing centrally and settling inferiorly post vitrectomy, clear         Fundus Exam       Right Left   Disc mild Pallor, Sharp rim 1-2+ Pallor, thin inferior rim, cupping   C/D Ratio 0.3 0.8   Macula Flat, good foveal reflex, scattered MA/IRH/exudate - improved, +ERM flat, Blunted foveal reflex, ERM gone, scattered Microaneurysms, mild interval improvement in nasal thickening   Vessels attenuated, Tortuous, mild Copper wiring Attenuated, Tortuous, sclerotic arterioles   Periphery Attached, scattered IRH. 360 PRP laser Attached, peripapillary fibrosis regressing, 360 PRP laser  changes           Refraction     Wearing Rx       Sphere Cylinder Axis Add   Right -1.00 Sphere 091 +0.00   Left +0.00 +0.75  +0.00            IMAGING AND PROCEDURES  Imaging and Procedures for @TODAY @  OCT, Retina - OU - Both Eyes       Right Eye Quality was borderline. Central Foveal Thickness: 206. Progression has been stable. Findings include normal foveal contour, no IRF, no SRF, intraretinal hyper-reflective material, epiretinal membrane, macular pucker, preretinal fibrosis.   Left Eye Quality was good. Central Foveal Thickness: 214. Progression has been stable. Findings include no SRF, abnormal foveal contour, epiretinal membrane, intraretinal fluid, outer retinal atrophy (Mild Interval improvement in nasal retinal thickening; stable improvement in ERM; persistent central ORA/SRHM).   Notes *Images captured and stored on drive  Diagnosis / Impression:  PDR OU OD: no DME OS: Mild Interval improvement in nasal retinal thickening; stable improvement in ERM; persistent central ORA/SRHM  Clinical management:  See below Abbreviations: NFP - Normal foveal profile. CME - cystoid macular edema. PED - pigment epithelial detachment. IRF - intraretinal fluid. SRF - subretinal fluid. EZ - ellipsoid zone. ERM - epiretinal membrane. ORA - outer retinal atrophy. ORT - outer retinal tubulation. SRHM - subretinal hyper-reflective material       Yag Capsulotomy - OS - Left Eye       Procedure note: YAG Capsulotomy, LEFT Eye  Informed consent obtained. Pre-op dilating drops (1% Topicamide and 2.5% Phenylephrine), and topical anesthesia given. Power: 7.2 mJ Shots: 59 Posterior capsulotomy in cruciate formation performed without difficulty. Patient tolerated procedure well. No complications. Rx pred forte 4 times a day for 7 days, then stop. Pt received written and verbal post laser education. Recheck in 2-3 weeks w/ dilated exam.           ASSESSMENT/PLAN:     ICD-10-CM   1. 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 Encounter for long-term (current) use of insulin (HCC)  Z79.4     3. Traction detachment of left retina  H33.42     4. Vitreous hemorrhage of both eyes (HCC)  H43.13     5. Epiretinal membrane (ERM) of left eye  H35.372     6. Essential hypertension  I10     7. Hypertensive retinopathy of both eyes  H35.033     8. Combined forms of age-related cataract of right eye  H25.811     9. Pseudophakia  Z96.1     10. PCO (posterior capsular opacification), left  H26.492 Yag Capsulotomy - OS - Left Eye     1-3.  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 (OS > OD)  **lost to f/u from 02.08.22 to 01.22.25 (almost 3 years)**  - most recent A1C 6.2 (09.05.24)  - s/p PRP OS (11.26.19)  - s/p PRP OD (06.01.20)  - lost to f/u following 11.26.19 appt due to complicated pregnancy, premature birth of baby w/ extended NICU stay and congenital heart defect, and post-partum health issues OS  - history of vision loss OS onset December 2019  - OS with chronic VH and TRD, likely present since Dec 2019  - s/p IVA OS #1 (06.23.20), #2 (04.02.21)  - s/p PPV/MP/EL/silicon oil + IVA OS for VH and TRD from DM1, 07.02.20             - had cataract surgery OS with Dr. Sherrine Maples, 12.15.20 -- looks good, +PCO  - s/p PPV/SOR removal/Tissue Blue stain/MP/20% SF6 gas, IVA OS, 07.01.21   OD  - today, VH significantly improved (good response to IVA OD #3, 9.24.21)  - s/p fill in PRP and IVA OD #1 07.02.2020 in operating room  - diffuse VH OD ~11.26.20   - s/p IVA OD #2 (11.30.20), #3 (09.24.21)  - BCVA improved to 20/20 from 20/25  - monitor  4,5. Epiretinal membrane, left eye -- improved  - focal ERM with retinal thickening nasal macula   - s/p TissueBlue assisted membrane peel OS as above (7.1.21)  - OCT today shows improved nasal thickening post-ERM  peel   6,7. Hypertensive retinopathy OU - there was concern for pregnancy related hypertension (I.e. Pre-eclampsia, Eclampsia, HELLP, etc) during pregnancy - also during pregnancy (7.17.2019), had bilateral CRVO w/ CME (OD > OS) s/p PRP OS  - discussed importance of tight BP control  - monitor  8. Mixed cataract OD - The symptoms of cataract, surgical options, and treatments and risks were discussed with patient.  - not visually significant    9,10. Pseudophakia OS w/ PCO  - s/p CE/IOL OS w/ Dr. Sherrine Maples, 12.15.20  - beautiful surgery  - 2-3+ PCO OS  - recommend YAG cap OS  - pt wishes to proceed with YAG OS  - RBA of procedure discussed, questions answered - informed consent obtained and signed - see procedure note   - start PF QID OS x7 days  Ophthalmic Meds Ordered this visit:  Meds ordered this encounter  Medications   prednisoLONE acetate (PRED FORTE) 1 % ophthalmic suspension    Sig: Place 1 drop into the left eye 4 (four) times daily for 7 days.    Dispense:  1.4 mL    Refill:  0      This document serves as a record of services personally performed by Karie Chimera, MD, PhD. It was created on their behalf by Glee Arvin. Manson Passey, OA an ophthalmic technician. The creation of this record is the provider's dictation and/or activities during the visit.    Electronically signed by: Glee Arvin. Manson Passey, OA 02/09/23 11:25 AM  This document serves as a record of services personally performed by Karie Chimera, MD, PhD. It was created on their behalf by Charlette Caffey, COT an ophthalmic technician. The creation of this record is the provider's dictation and/or activities during the visit.    Electronically signed by:  Charlette Caffey, COT  02/09/23 11:25 AM  Karie Chimera, M.D., Ph.D. Diseases & Surgery of the Retina and Vitreous [facility] 02/09/2023   I have reviewed the above documentation for accuracy and  completeness, and I agree with the  above. Karie Chimera, M.D., Ph.D. 02/09/23 11:25 AM    Abbreviations: M myopia (nearsighted); A astigmatism; H hyperopia (farsighted); P presbyopia; Mrx spectacle prescription;  CTL contact lenses; OD right eye; OS left eye; OU both eyes  XT exotropia; ET esotropia; PEK punctate epithelial keratitis; PEE punctate epithelial erosions; DES dry eye syndrome; MGD meibomian gland dysfunction; ATs artificial tears; PFAT's preservative free artificial tears; NSC nuclear sclerotic cataract; PSC posterior subcapsular cataract; ERM epi-retinal membrane; PVD posterior vitreous detachment; RD retinal detachment; DM diabetes mellitus; DR diabetic retinopathy; NPDR non-proliferative diabetic retinopathy; PDR proliferative diabetic retinopathy; CSME clinically significant macular edema; DME diabetic macular edema; dbh dot blot hemorrhages; CWS cotton wool spot; POAG primary open angle glaucoma; C/D cup-to-disc ratio; HVF humphrey visual field; GVF goldmann visual field; OCT optical coherence tomography; IOP intraocular pressure; BRVO Branch retinal vein occlusion; CRVO central retinal vein occlusion; CRAO central retinal artery occlusion; BRAO branch retinal artery occlusion; RT retinal tear; SB scleral buckle; PPV pars plana vitrectomy; VH Vitreous hemorrhage; PRP panretinal laser photocoagulation; IVK intravitreal kenalog; VMT vitreomacular traction; MH Macular hole;  NVD neovascularization of the disc; NVE neovascularization elsewhere; AREDS age related eye disease study; ARMD age related macular degeneration; POAG primary open angle glaucoma; EBMD epithelial/anterior basement membrane dystrophy; ACIOL anterior chamber intraocular lens; IOL intraocular lens; PCIOL posterior chamber intraocular lens; Phaco/IOL phacoemulsification with intraocular lens placement; PRK photorefractive keratectomy; LASIK laser assisted in situ keratomileusis; HTN hypertension; DM diabetes mellitus; COPD chronic obstructive pulmonary disease

## 2023-02-02 NOTE — Assessment & Plan Note (Signed)
 Endorses some ongoing midthoracic pain for which she is using lidocaine  gel with some relief.  She has tenderness to palpation over the right trapezius muscle of the back.  Some scapular asymmetry when standing, but otherwise preserved range of motion of the shoulder joints and no tenderness to palpation of the glenohumeral or AC joints. Plan: PT referral

## 2023-02-02 NOTE — Assessment & Plan Note (Signed)
 Hypertensive to 147/81 at today's visit, patient states that her blood pressure usually gets lower during dialysis.  Lab work done at dialysis, will defer antihypertensive regimen to nephrology.

## 2023-02-02 NOTE — Patient Instructions (Signed)
 Thank you, [name] for allowing us  to provide your care today.  I have ordered the following tests for you:  CT AP without contrast   Referral Orders         Ambulatory referral to Physical Therapy       Follow up: 2-3 months or as needed   We look forward to seeing you next time. Please call our clinic at [phone] if you have any questions or concerns. The best time to call is Monday-Friday from 9am-4pm, but there is someone available 24/7. If after hours or the weekend, call the main hospital number and ask for the Internal Medicine Resident On-Call. If you need medication refills, please notify your pharmacy one week in advance and they will send us  a request.   Thank you for trusting me with your care. Wishing you the best!  Sheree Dieter MD [facility]

## 2023-02-02 NOTE — Progress Notes (Signed)
 Subjective:  CC: Appendicitis follow-up  HPI:  [name] is a 33 y.o. person with a past medical history stated below and presents today for the stated chief complaint. Please see problem based assessment and plan for additional details.  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  She was recently hospitalized with appendicitis which was treated medically.  Due to her appendicitis she was taken off the transplant list, and is following up with us  today in order to obtain clearance to return to the transplant list.  Past Medical History:  Diagnosis Date   Anxiety    Asthma    as a child, no problems as an adult, no inhaler   Cataract    NS OU   Chronic hypertension during pregnancy, antepartum 08/19/2017   Dehydration 01/28/2018   Depression    Depression during pregnancy, antepartum 07/07/2017   6/20: Short trial of zoloft  previously, reports didn't help much but also didn't give it a chance Discussed r/b/a SSRIs in pregnancy, agrees to try Zoloft  again, rx sent No SI/HI/red flags   Diabetes (HCC)    TYPE I. 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   ESRD on peritoneal dialysis (HCC)    HTN (hypertension)    Hypertensive retinopathy    OU   Hypokalemia 01/22/2018   Hypomagnesemia 01/28/2018   Intractable nausea and vomiting 01/22/2018   Intrauterine growth restriction (IUGR) affecting care of mother 12/22/2017   Morbid obesity (HCC)    MSSA bacteremia 07/04/2021   Nephropathy, diabetic (HCC) 12/29/2017   Severe hyperemesis gravidarum 10/30/2017   Type I diabetes mellitus (HCC) 07/07/2017   Current Diabetic Medications:  Insulin   [x]  Aspirin  81 mg daily after 12 weeks (? A2/B GDM)  Required Referrals for A1GDM or A2GDM: [x]  Diabetes Education and Testing Supplies [x]  Nutrition Cousult   For A2/B GDM or higher classes of DM [x]  Diabetes Education and Testing Supplies [x]  Nutrition Counsult [x]  Fetal ECHO after 20 weeks  [x]  Eye exam for retina evaluation - severe retinopathy 7/19  Base   Ventricular septal defect (VSD) of fetus in singleton pregnancy, antepartum 09/30/2017   May go to newborn nursery per Dr. Glendale Landmark Echo prior to discharge    Current Outpatient Medications on File Prior to Visit  Medication Sig Dispense Refill   acetaminophen  (TYLENOL ) 325 MG tablet Take 650 mg by mouth every 6 (six) hours as needed for mild pain.     amLODipine  (NORVASC ) 10 MG tablet Take 1 tablet (10 mg total) by mouth daily. (Patient taking differently: Take 10 mg by mouth at bedtime.) 90 tablet 1   calcitRIOL  (ROCALTROL ) 0.5 MCG capsule Take 0.5 mcg by mouth 4 (four) times a week. Monday, Tuesday, Thursday, and Friday     carvedilol  (COREG ) 25 MG tablet Take 1 tablet (25 mg total) by mouth 2 (two) times daily. 180 tablet 3   ethyl chloride spray Apply 1 Application topically 4 (four) times a week. Apply to arm on Monday, Tuesday, Thursday, and Friday     hydrALAZINE  (APRESOLINE ) 100 MG tablet Take 100 mg by mouth 2 (two) times daily.     insulin  degludec (TRESIBA  FLEXTOUCH) 200 UNIT/ML FlexTouch Pen Inject 12 Units into the skin daily in the afternoon.     [Paused] insulin  lispro (HUMALOG  KWIKPEN) 100 UNIT/ML KwikPen Max daily 40 units (Patient taking  differently: Inject 6 Units into the skin 3 (three) times daily with meals. Max daily 40 units) 30 mL 6   lidocaine  (LMX) 4 % cream Apply 1 Application topically once a week. Apply on Monday,Tuesday, Thursday, and Friday     losartan  (COZAAR ) 100 MG tablet Take 100 mg by mouth at bedtime.     metoCLOPramide  (REGLAN ) 10 MG tablet Take 1 tablet (10 mg total) by mouth every 6 (six) hours. (Patient taking differently: Take 10 mg by mouth every 6 (six) hours as needed for nausea or vomiting.) 30 tablet 0   mirtazapine  (REMERON ) 15 MG tablet Take 1 tablet  (15 mg total) by mouth at bedtime. Do not take medicine in the morning and drive. (Patient taking differently: Take 15 mg by mouth at bedtime.) 90 tablet 3   multivitamin (RENA-VIT) TABS tablet Take 1 tablet by mouth at bedtime. 90 tablet 1   SENSIPAR  60 MG tablet Take 60 mg by mouth at bedtime.     sucroferric oxyhydroxide (VELPHORO ) 500 MG chewable tablet Chew 1,500 mg by mouth 3 (three) times daily with meals.     No current facility-administered medications on file prior to visit.    Review of Systems: Please see assessment and plan for pertinent positives and negatives.  Objective:   Vitals:   02/02/23 1504  BP: (!) 147/81  Pulse: 89  Temp: 98.2 F (36.8 C)  TempSrc: Oral  SpO2: 100%  Weight: 151 lb 12.8 oz (68.9 kg)  Height: 5\' 6"  (1.676 m)    Physical Exam: Constitutional: Well-appearing Cardiovascular: Normal rate and rhythm.  Right AV fistula with bruit on auscultation palpable thrill. Pulmonary/Chest: lungs clear to auscultation bilaterally Abdominal: soft, non-tender, non-distended Extremities: No edema of the lower extremities bilaterally Psych: Pleasant affect Thought process is linear and is goal-directed.     Assessment & Plan:  Appendicitis with nonoperative management Off the transplant list for now due to appendicitis which was successfully treated nonoperatively.  Patient denies any ongoing symptoms such as abdominal pain, nausea, vomiting, fevers, or chills.  Transplant team requesting radiological workup to confirm complete resolution of appendicitis. Plan: CT abdomen pelvis without contrast (ESRD)   ESRD on hemodialysis (HCC) Going to dialysis 4 days a week, they are able to use her right AV fistula for access.  She does also have a left femoral dialysis catheter in place.  No issues with dialysis at this time.  She is very motivated to get a kidney transplant, and is working to get back on the transplant list with [facility]  Chronic midline  thoracic back pain Endorses some ongoing midthoracic pain for which she is using lidocaine  gel with some relief.  She has tenderness to palpation over the right trapezius muscle of the back.  Some scapular asymmetry when standing, but otherwise preserved range of motion of the shoulder joints and no tenderness to palpation of the glenohumeral or AC joints. Plan: PT referral   Essential hypertension Hypertensive to 147/81 at today's visit, patient states that her blood pressure usually gets lower during dialysis.  Lab work done at dialysis, will defer antihypertensive regimen to nephrology.    Patient discussed with Dr. Meldon Sport MD [facility]  PGY-1 Pager: [phone]  Phone: [phone] Date 02/02/2023  Time 9:57 PM

## 2023-02-04 NOTE — Addendum Note (Signed)
Addended by: Burnell Blanks on: 02/04/2023 08:54 AM   Modules accepted: Level of Service

## 2023-02-04 NOTE — Progress Notes (Signed)
 Internal Medicine Clinic Attending  Case discussed with the resident at the time of the visit.  We reviewed the resident's history and exam and pertinent patient test results.  I agree with the assessment, diagnosis, and plan of care documented in the resident's note.

## 2023-02-09 ENCOUNTER — Ambulatory Visit (INDEPENDENT_AMBULATORY_CARE_PROVIDER_SITE_OTHER): Payer: Medicare (Managed Care) | Admitting: Ophthalmology

## 2023-02-09 ENCOUNTER — Encounter: Payer: Medicare Other | Admitting: Student

## 2023-02-09 ENCOUNTER — Encounter (INDEPENDENT_AMBULATORY_CARE_PROVIDER_SITE_OTHER): Payer: Self-pay | Admitting: Ophthalmology

## 2023-02-09 DIAGNOSIS — Z961 Presence of intraocular lens: Secondary | ICD-10-CM

## 2023-02-09 DIAGNOSIS — I10 Essential (primary) hypertension: Secondary | ICD-10-CM

## 2023-02-09 DIAGNOSIS — Z794 Long term (current) use of insulin: Secondary | ICD-10-CM

## 2023-02-09 DIAGNOSIS — H4313 Vitreous hemorrhage, bilateral: Secondary | ICD-10-CM | POA: Diagnosis not present

## 2023-02-09 DIAGNOSIS — E103513 Type 1 diabetes mellitus with proliferative diabetic retinopathy with macular edema, bilateral: Secondary | ICD-10-CM

## 2023-02-09 DIAGNOSIS — H35033 Hypertensive retinopathy, bilateral: Secondary | ICD-10-CM

## 2023-02-09 DIAGNOSIS — H26492 Other secondary cataract, left eye: Secondary | ICD-10-CM | POA: Diagnosis not present

## 2023-02-09 DIAGNOSIS — H3342 Traction detachment of retina, left eye: Secondary | ICD-10-CM | POA: Diagnosis not present

## 2023-02-09 DIAGNOSIS — H35372 Puckering of macula, left eye: Secondary | ICD-10-CM | POA: Diagnosis not present

## 2023-02-09 DIAGNOSIS — H25811 Combined forms of age-related cataract, right eye: Secondary | ICD-10-CM

## 2023-02-09 MED ORDER — PREDNISOLONE ACETATE 1 % OP SUSP: 1.0000 [drp] | Freq: Four times a day (QID) | OPHTHALMIC | 0 refills | Status: AC

## 2023-02-14 ENCOUNTER — Ambulatory Visit (HOSPITAL_COMMUNITY)
Admission: RE | Admit: 2023-02-14 | Discharge: 2023-02-14 | Disposition: A | Discharge status: Home / Self Care | Payer: Medicare (Managed Care) | Source: Ambulatory Visit | Attending: Internal Medicine | Admitting: Internal Medicine

## 2023-02-14 DIAGNOSIS — Z992 Dependence on renal dialysis: Secondary | ICD-10-CM | POA: Diagnosis present

## 2023-02-14 DIAGNOSIS — N186 End stage renal disease: Secondary | ICD-10-CM | POA: Insufficient documentation

## 2023-02-14 DIAGNOSIS — K37 Unspecified appendicitis: Secondary | ICD-10-CM | POA: Diagnosis present

## 2023-02-23 ENCOUNTER — Ambulatory Visit: Payer: Medicare (Managed Care) | Attending: Internal Medicine | Admitting: Physical Therapy

## 2023-02-23 ENCOUNTER — Encounter: Payer: Self-pay | Admitting: Physical Therapy

## 2023-02-23 DIAGNOSIS — R252 Cramp and spasm: Secondary | ICD-10-CM | POA: Insufficient documentation

## 2023-02-23 DIAGNOSIS — M546 Pain in thoracic spine: Secondary | ICD-10-CM | POA: Insufficient documentation

## 2023-02-23 DIAGNOSIS — G8929 Other chronic pain: Secondary | ICD-10-CM | POA: Insufficient documentation

## 2023-02-23 DIAGNOSIS — M6281 Muscle weakness (generalized): Secondary | ICD-10-CM | POA: Insufficient documentation

## 2023-02-23 NOTE — Therapy (Signed)
 OUTPATIENT PHYSICAL THERAPY THORACOLUMBAR EVALUATION   Patient Name: Barbara Donovan MRN: [id number] DOB:1990-02-16, 33 y.o., female Today's Date: 02/23/2023  END OF SESSION:  PT End of Session - 02/23/23 1103     Visit Number 1    Number of Visits 8    Date for PT Re-Evaluation 03/23/23    Authorization Type [employer] MCR/MCD    PT Start Time 0758    PT Stop Time 0842    PT Time Calculation (min) 44 min    Activity Tolerance Patient tolerated treatment well;Patient limited by fatigue;Other (comment)   bathroom break, 8 min   Behavior During Therapy Flat affect             Past Medical History:  Diagnosis Date   Anxiety    Asthma    as a child, no problems as an adult, no inhaler   Cataract    NS OU   Chronic hypertension during pregnancy, antepartum 08/19/2017   Dehydration 01/28/2018   Depression    Depression during pregnancy, antepartum 07/07/2017   6/20: Short trial of zoloft  previously, reports didn't help much but also didn't give it a chance Discussed r/b/a SSRIs in pregnancy, agrees to try Zoloft  again, rx sent No SI/HI/red flags   Diabetes (HCC)    TYPE I. 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   ESRD on peritoneal dialysis (HCC)    HTN (hypertension)    Hypertensive retinopathy    OU   Hypokalemia 01/22/2018   Hypomagnesemia 01/28/2018   Intractable nausea and vomiting 01/22/2018   Intrauterine growth restriction (IUGR) affecting care of mother 12/22/2017   Morbid obesity (HCC)    MSSA bacteremia 07/04/2021   Nephropathy, diabetic (HCC) 12/29/2017   Severe hyperemesis gravidarum 10/30/2017   Type I diabetes mellitus (HCC) 07/07/2017   Current Diabetic Medications:  Insulin   [x]  Aspirin  81 mg daily after 12 weeks (? A2/B GDM)  Required Referrals for A1GDM or A2GDM: [x]  Diabetes Education and Testing Supplies [x]  Nutrition Cousult  For A2/B GDM or  higher classes of DM [x]  Diabetes Education and Testing Supplies [x]  Nutrition Counsult [x]  Fetal ECHO after 20 weeks  [x]  Eye exam for retina evaluation - severe retinopathy 7/19  Base   Ventricular septal defect (VSD) of fetus in singleton pregnancy, antepartum 09/30/2017   May go to newborn nursery per Dr. Lajuanda Echo prior to discharge   Past Surgical History:  Procedure Laterality Date   25 GAUGE PARS PLANA VITRECTOMY WITH 20 GAUGE MVR PORT FOR MACULAR HOLE Left 07/20/2018   Procedure: 25 GAUGE PARS PLANA VITRECTOMY LEFT EYE;  Surgeon: Valdemar Rogue, MD;  Location: [facility] OR;  Service: Ophthalmology;  Laterality: Left;   AV FISTULA PLACEMENT Left 08/11/2021   Procedure: INSERTION OF LEFT ARM ARTERIOVENOUS (AV) GORE-TEX GRAFT;  Surgeon: Lanis Fonda BRAVO, MD;  Location[facility] OR;  Service: Vascular;  Laterality: Left;  PERIPHERAL NERVE BLOCK   AV FISTULA PLACEMENT Right 08/24/2022   Procedure: RIGHT UPPER EXTREMITY ARTERIOVENOUS (AV) FISTULA CREATION;  Surgeon: Eliza Lonni RAMAN, MD;  Location: [facility] OR;  Service: Vascular;  Laterality: Right;   AVGG REMOVAL Left 06/22/2022   Procedure: REMOVAL OF ARTERIOVENOUS GORETEX GRAFT (AVGG) LEFT ARM WITH VEIN PATCH OF BRACHIAL ARTERY;  Surgeon: Eliza Lonni RAMAN, MD;  Location: [facility] OR;  Service: Vascular;  Laterality: Left;   BASCILIC VEIN TRANSPOSITION Right 11/29/2022   Procedure: RIGHT ARM BASILIC VEIN TRANSPOSITION;  Surgeon:  Lanis Fonda BRAVO, MD;  Location: [facility] OR;  Service: Vascular;  Laterality: Right;   CAPD INSERTION N/A 09/10/2020   Procedure: LAPAROSCOPIC INSERTION CONTINUOUS AMBULATORY PERITONEAL DIALYSIS  (CAPD) CATHETER;  Surgeon: Serene Gaile ORN, MD;  Location: [facility] OR;  Service: Vascular;  Laterality: N/A;   CESAREAN SECTION N/A 12/26/2017   Procedure: CESAREAN SECTION;  Surgeon: Herchel Gloris LABOR, MD;  Location: [facility];  Service: Obstetrics;  Laterality: N/A;   EYE EXAMINATION UNDER ANESTHESIA Right 07/20/2018   Procedure: Eye Exam Under  Anesthesia RIGHT EYE;  Surgeon: Valdemar Rogue, MD;  Location: [facility] OR;  Service: Ophthalmology;  Laterality: Right;   EYE SURGERY Left 07/2018   GAS INSERTION Left 07/19/2019   Procedure: INSERTION OF GAS;  Surgeon: Valdemar Rogue, MD;  Location: [facility] OR;  Service: Ophthalmology;  Laterality: Left;  SF6   INJECTION OF SILICONE OIL Left 07/20/2018   Procedure: Injection Of Silicone Oil LEFT EYE;  Surgeon: Valdemar Rogue, MD;  Location: [facility] OR;  Service: Ophthalmology;  Laterality: Left;   INSERTION OF DIALYSIS CATHETER Right 07/05/2021   Procedure: REMOVAL OF DIALYSIS CATHETER;  Surgeon: Magda Debby SAILOR, MD;  Location: [facility] OR;  Service: Vascular;  Laterality: Right;   INSERTION OF DIALYSIS CATHETER Right 07/09/2021   Procedure: INSERTION OF TUNNELED DIALYSIS CATHETER;  Surgeon: Magda Debby SAILOR, MD;  Location: [facility] OR;  Service: Vascular;  Laterality: Right;   INSERTION OF DIALYSIS CATHETER Left 07/09/2021   Procedure: INSERTION OF TUNNELED  DIALYSIS CATHETER;  Surgeon: Magda Debby SAILOR, MD;  Location: [facility] OR;  Service: Vascular;  Laterality: Left;   INSERTION OF DIALYSIS CATHETER N/A 06/22/2022   Procedure: INSERTION OF LEFT INTERNAL JUGULAR TRIALYSIS DIALYSIS CATHETER;  Surgeon: Eliza Lonni RAMAN, MD;  Location: [facility] OR;  Service: Vascular;  Laterality: N/A;   INSERTION OF DIALYSIS CATHETER Left 06/28/2022   Procedure: ULTRASOUND GUIDED INSERTION OF TUNNELED DIALYSIS CATHETER;  Surgeon: Magda Debby SAILOR, MD;  Location: [facility] OR;  Service: Vascular;  Laterality: Left;   INSERTION OF DIALYSIS CATHETER Left 11/29/2022   Procedure: INSERTION OF TUNNELED DIALYSIS CATHETER TO LEFT FEMORAL VEIN;  Surgeon: Lanis Fonda BRAVO, MD;  Location: [facility] OR;  Service: Vascular;  Laterality: Left;   IR FLUORO GUIDE CV LINE RIGHT  06/02/2020   IR US  GUIDE VASC ACCESS RIGHT  06/02/2020   LASER PHOTO ABLATION Right 07/20/2018   Procedure: Laser Photo Ablation RIGHT EYE;  Surgeon: Valdemar Rogue, MD;  Location: [facility] OR;  Service: Ophthalmology;   Laterality: Right;   MEMBRANE PEEL Left 07/20/2018   Procedure: Membrane Peel LEFT EYE;  Surgeon: Valdemar Rogue, MD;  Location: [facility] OR;  Service: Ophthalmology;  Laterality: Left;   MEMBRANE PEEL Left 07/19/2019   Procedure: MEMBRANE PEEL;  Surgeon: Valdemar Rogue, MD;  Location: [facility] OR;  Service: Ophthalmology;  Laterality: Left;   MITOMYCIN  C APPLICATION Bilateral 07/20/2018   Procedure: Avastin  Application;  Surgeon: Valdemar Rogue, MD;  Location: [facility] OR;  Service: Ophthalmology;  Laterality: Bilateral;   PHOTOCOAGULATION WITH LASER Left 07/20/2018   Procedure: Photocoagulation With Laser LEFT EYE;  Surgeon: Valdemar Rogue, MD;  Location: [facility] OR;  Service: Ophthalmology;  Laterality: Left;   PHOTOCOAGULATION WITH LASER Left 07/19/2019   Procedure: PHOTOCOAGULATION WITH LASER;  Surgeon: Valdemar Rogue, MD;  Location: [facility] OR;  Service: Ophthalmology;  Laterality: Left;   RETINAL DETACHMENT SURGERY Left 07/20/2018   Dr. Rogue Valdemar   SILICON OIL REMOVAL Left 07/19/2019   Procedure: 25g PARS PLANA VITRECTOMY WITH SILICON OIL REMOVAL;  Surgeon: Valdemar,  Redell, MD;  Location: [facility] OR;  Service: Ophthalmology;  Laterality: Left;   TEE WITHOUT CARDIOVERSION N/A 07/08/2021   Procedure: TRANSESOPHAGEAL ECHOCARDIOGRAM (TEE);  Surgeon: Kate Lonni CROME, MD;  Location: [facility];  Service: Cardiovascular;  Laterality: N/A;   TEE WITHOUT CARDIOVERSION N/A 06/29/2022   Procedure: TRANSESOPHAGEAL ECHOCARDIOGRAM;  Surgeon: Delford Maude BROCKS, MD;  Location: [facility] INVASIVE CV LAB;  Service: Cardiovascular;  Laterality: N/A;   WISDOM TOOTH EXTRACTION     Patient Active Problem List   Diagnosis Date Noted   Chronic midline thoracic back pain 02/02/2023   Appendicitis with nonoperative management 01/15/2023   Displacement of vascular dialysis catheter (HCC) 11/28/2022   Pulmonary cavitary lesion 07/26/2022   Medication management 07/26/2022   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)  10/13/2021   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) 10/13/2021   Greater trochanteric pain syndrome of right lower extremity 07/23/2021   Hyperlipidemia 04/20/2021   Anxiety and depression 02/04/2021   DKA, type 1 (HCC) 11/10/2020   Type 1 diabetes mellitus with hyperglycemia (HCC) 11/10/2020   ESRD on hemodialysis (HCC) 11/10/2020   Anemia due to stage 5 chronic kidney disease (HCC) 03/04/2020   Intractable nausea and vomiting 01/22/2018   Essential hypertension 08/21/2017   Diabetic retinopathy (HCC) 06/09/2017   Asthma 10/30/2013    PCP: Forest Coy, MD   REFERRING PROVIDER: Forest Coy, MD  REFERRING DIAG: [phone] (ICD-10-CM) - Chronic midline thoracic back pain  Rationale for Evaluation and Treatment: Rehabilitation  THERAPY DIAG:  Muscle weakness (generalized)  Pain in thoracic spine  Cramp and spasm  ONSET DATE: chronic   SUBJECTIVE:                                                                                                                                                                                           SUBJECTIVE STATEMENT: Pt noticed increased Rt sided back and neck pain about 3 mos ago.  She was hospitalized end of Dec. and I really noticed the pain starting in the Rt side of her middle back. The pain does not radiate into her arms or into her low back.  I notice it more when I'm laying down or sitting in the chair for dialysis -3 hours at a time. The pain prevents her from using her arms for ADLs, playing with her son, doing home taks and begin comfortable at rest, sleeping. No imaging done at this time.   PERTINENT HISTORY:  Recent appendicitis, Diabetes, ESRF Anxiety, depression, hip pain     PAIN:  Are you having pain? Yes: NPRS scale: 6/10 Pain location: mid  back , Rt upper trap  Pain description: uncomfortable, tight  Aggravating factors: sitting and lying down Relieving factors: Ben gay cream, has tried heat,  sometimes moving it   PRECAUTIONS: Other: Port for dialysis 4 x per week   RED FLAGS: None   WEIGHT BEARING RESTRICTIONS: No  FALLS:  Has patient fallen in last 6 months? No  LIVING ENVIRONMENT: Lives with: lives with their partner and lives with their son Lives in: House/apartment Stairs: Yes: Internal: 12 steps; no issues  Has following equipment at home: None  OCCUPATION: not working  PLOF: Independent and Leisure: hangs out with her 33 yr old, limited due to medical issues   PATIENT GOALS: I would like to feel better   NEXT MD VISIT: Internal medicine, a few mos   OBJECTIVE:  Note: Objective measures were completed at Evaluation unless otherwise noted.  DIAGNOSTIC FINDINGS:  None   PATIENT SURVEYS:  Modified Oswestry 21/50 (42%)   COGNITION: Overall cognitive status: Within functional limits for tasks assessed     SENSATION: WFL  POSTURE: forward head and elevated scapula   PALPATION: Pain with palpation to Rt upper trap, rhomboid and lower trapezius Paraspinals tense along TL spine   LUMBAR ROM:   AROM eval  Flexion WFL with pain   Extension Limited 25% no pain   Right lateral flexion   Left lateral flexion   Right rotation Stiff  Left rotation Stiff    (Blank rows = not tested)  LOWER EXTREMITY ROM:   WNL   LOWER EXTREMITY MMT:    MMT Right eval Left eval  Hip flexion 4/5 4-/5 pain   Hip extension    Hip abduction    Hip adduction    Hip internal rotation    Hip external rotation    Knee flexion 4+/5 4+/5  Knee extension 5/5 5/5  Ankle dorsiflexion    Ankle plantarflexion    Ankle inversion    Ankle eversion     (Blank rows = not tested)   UPPER EXTREMITY ROM: WNL   Active ROM Right 02/23/2023 Left 02/23/2023  Shoulder flexion    Shoulder extension    Shoulder abduction    Shoulder adduction    Shoulder internal rotation    Shoulder external rotation     UPPER EXTREMITY MMT:  MMT Right 02/23/2023 Left 02/23/2023  Shoulder  flexion 4 4  Shoulder extension    Shoulder abduction 4- 4  Shoulder adduction    Shoulder internal rotation    Shoulder external rotation    Middle trapezius    Lower trapezius    Elbow flexion 4+ 4+  Elbow extension      LUMBAR SPECIAL TESTS:  NT   FUNCTIONAL TESTS:  5 times sit to stand: 13 sec  SLS 10-15 sec each LE      GAIT: Distance walked: 100 Assistive device utilized: None Level of assistance: Modified independence Comments: slow pace , decreased trunk rotation   TREATMENT DATE: [facility] Adult PT Treatment:                                                DATE: 02/23/23 Self care: Open book  x 3 each side Corner stretch x 2  Upper trap and levator scap stretch each side x 2  Pt education see below  PATIENT EDUCATION:  Education details: POC, HEP, dry needling, manual therapy, joint crepitus , use pillows during dialysis for arms, neck, posture  Person educated: Patient Education method: Explanation, Demonstration, Verbal cues, and Handouts Education comprehension: verbalized understanding, returned demonstration, and needs further education  HOME EXERCISE PROGRAM: Access Code: [email or web address] URL: [email or web address] Date: 02/23/2023 Prepared by: Delon Norma  Exercises - Upper Trapezius Stretch  - 1-2 x daily - 7 x weekly - 1 sets - 3 reps - 30 hold - Gentle Levator Scapulae Stretch  - 1-2 x daily - 7 x weekly - 1 sets - 3 reps - 30 hold - Seated Scapular Retraction  - 3-5 x daily - 7 x weekly - 1 sets - 5-10 reps - 10 hold - Corner Pec Major Stretch  - 1-2 x daily - 7 x weekly - 1 sets - 3 reps - 30 hold - Sidelying Thoracic Rotation with Open Book  - 1 x daily - 7 x weekly - 1 sets - 5 reps - 10-15 hold  ASSESSMENT:  CLINICAL IMPRESSION: Patient is a 33 y.o. female who was seen today for physical therapy evaluation and  treatment for mid /upper back pain.   OBJECTIVE IMPAIRMENTS: decreased activity tolerance, decreased endurance, decreased knowledge of condition, decreased mobility, decreased ROM, decreased strength, increased fascial restrictions, increased muscle spasms, impaired flexibility, impaired UE functional use, postural dysfunction, and pain.   ACTIVITY LIMITATIONS: carrying, lifting, bending, sitting, squatting, locomotion level, and caring for others  PARTICIPATION LIMITATIONS: cleaning, interpersonal relationship, driving, shopping, and community activity  PERSONAL FACTORS: 3+ comorbidities: Diabetes, neuropathy, kidney disease  are also affecting patient's functional outcome.   REHAB POTENTIAL: Excellent  CLINICAL DECISION MAKING: Evolving/moderate complexity  EVALUATION COMPLEXITY: Moderate   GOALS: Goals reviewed with patient? Yes   LONG TERM GOALS: Target date: 03/23/2023  Pt will be I with HEP  Baseline: given on eval  Goal status: INITIAL  2.  Pt will be able to demo Rt and Lt UE strength to 4+/5 ore more without increased back pain  Baseline: 4-/5 to 4/5  Goal status: INITIAL  3.  Pt will be able to sit for dialysis without increasing Rt upper back pain (3 hours) , pain moderate to severe  Baseline: recommend neck support  Goal status: INITIAL  4.  Pt will be able to perform childcare and home tasks without increased pain  in Rt upper/mid  back  Baseline: causes pain  Goal status: INITIAL  5.  ODI will improve by 8% or more to demo improved functional mobility  Baseline: 42% (goal is 32%)  Goal status: INITIAL   PLAN:  PT FREQUENCY: 2x/week  PT DURATION: 4 weeks  PLANNED INTERVENTIONS: 97164- PT Re-evaluation, 97110-Therapeutic exercises, 97530- Therapeutic activity, 97112- Neuromuscular re-education, 97535- Self Care, [id number]- Manual therapy, Patient/Family education, Balance training, Taping, Dry Needling, Spinal mobilization, Cryotherapy, and Moist heat.  PLAN  FOR NEXT SESSION: check HEP. Begin upper back/scap strength with bands .    Johnnay Pleitez, PT 02/23/2023, 11:04 AM   Delon Norma, PT 02/23/23 11:15 AM Phone: [phone] Fax: [phone]

## 2023-02-24 ENCOUNTER — Telehealth: Payer: Self-pay | Admitting: Student

## 2023-02-24 ENCOUNTER — Encounter: Payer: Self-pay | Admitting: Student

## 2023-02-24 DIAGNOSIS — R911 Solitary pulmonary nodule: Secondary | ICD-10-CM | POA: Insufficient documentation

## 2023-02-24 NOTE — Telephone Encounter (Signed)
 I spoke with Barbara Donovan on the phone. Patient's identity was confirmed using two patient specific identifiers. We discussed the results of her CT scan.  Patient denying signs of colitis such as abdominal pain, diarrhea, constipation, melena, or hematochezia.  Patient denies pain around the umbilicus.  Discussed the groundglass nodules of the lower lung lobes.  Will tentatively plan to follow-up with noncontrast chest CT 3 to 6 months as recommended.  Patient following up with her transplant team and general surgeon regarding CT results as well. Lung nodule added to problem list with reccommended follow up.

## 2023-02-28 ENCOUNTER — Encounter: Payer: Self-pay | Admitting: Internal Medicine

## 2023-02-28 ENCOUNTER — Ambulatory Visit (INDEPENDENT_AMBULATORY_CARE_PROVIDER_SITE_OTHER): Payer: Medicare (Managed Care) | Admitting: Internal Medicine

## 2023-02-28 VITALS — BP 126/74 | HR 75 | Ht 66.0 in | Wt 155.0 lb

## 2023-02-28 DIAGNOSIS — Z992 Dependence on renal dialysis: Secondary | ICD-10-CM

## 2023-02-28 DIAGNOSIS — E1065 Type 1 diabetes mellitus with hyperglycemia: Secondary | ICD-10-CM

## 2023-02-28 DIAGNOSIS — E10319 Type 1 diabetes mellitus with unspecified diabetic retinopathy without macular edema: Secondary | ICD-10-CM

## 2023-02-28 DIAGNOSIS — N186 End stage renal disease: Secondary | ICD-10-CM | POA: Diagnosis not present

## 2023-02-28 DIAGNOSIS — E1022 Type 1 diabetes mellitus with diabetic chronic kidney disease: Secondary | ICD-10-CM

## 2023-02-28 MED ORDER — INSULIN PEN NEEDLE 32G X 4 MM MISC
1.0000 | Freq: Four times a day (QID) | 3 refills | Status: DC
Start: 2023-02-28 — End: 2023-05-30

## 2023-02-28 MED ORDER — TRESIBA FLEXTOUCH 200 UNIT/ML ~~LOC~~ SOPN: 14.0000 [IU] | PEN_INJECTOR | Freq: Every day | SUBCUTANEOUS | 3 refills | Status: AC

## 2023-02-28 MED ORDER — INSULIN LISPRO (1 UNIT DIAL) 100 UNIT/ML (KWIKPEN): PEN_INJECTOR | SUBCUTANEOUS | 4 refills | Status: AC
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
## 2023-02-28 NOTE — Patient Instructions (Addendum)
 Increase Lantus  14  units daily  Continue  Humalog  6 units with Breakfast, 6 units with lunch and 8 units with Supper  Humalog  correctional insulin : ADD extra units on insulin  to your meal-time Humalog   dose if your blood sugars are higher than 170. Use the scale below to help guide you:   Blood sugar before meal Number of units to inject  Less than 170 0 unit  171 -  220 1 units  221 -  270 2 units  271 -  320 3 units  321 -  370 4 units  371 -  420 5 units  421 -  470 6 units     HOW TO TREAT LOW BLOOD SUGARS (Blood sugar LESS THAN 70 MG/DL) Please follow the RULE OF 15 for the treatment of hypoglycemia treatment (when your (blood sugars are less than 70 mg/dL)   STEP 1: Take 15 grams of carbohydrates when your blood sugar is low, which includes:  3-4 GLUCOSE TABS  OR 3-4 OZ OF JUICE OR REGULAR SODA OR ONE TUBE OF GLUCOSE GEL    STEP 2: RECHECK blood sugar in 15 MINUTES STEP 3: If your blood sugar is still low at the 15 minute recheck --> then, go back to STEP 1 and treat AGAIN with another 15 grams of carbohydrates.

## 2023-03-01 NOTE — Therapy (Incomplete)
OUTPATIENT PHYSICAL THERAPY THORACOLUMBAR TREATMENT   Patient Name: Barbara Donovan MRN: [id number] DOB:May 30, 1990, 33 y.o., female Today's Date: 03/01/2023  END OF SESSION:    Past Medical History:  Diagnosis Date   Anxiety    Asthma    as a child, no problems as an adult, no inhaler   Cataract    NS OU   Chronic hypertension during pregnancy, antepartum 08/19/2017   Dehydration 01/28/2018   Depression    Depression during pregnancy, antepartum 07/07/2017   6/20: Short trial of zoloft previously, reports didn't help much but also didn't give it a chance Discussed r/b/a SSRIs in pregnancy, agrees to try Zoloft again, rx sent No SI/HI/red flags   Diabetes (HCC)    TYPE I. 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   ESRD on peritoneal dialysis (HCC)    HTN (hypertension)    Hypertensive retinopathy    OU   Hypokalemia 01/22/2018   Hypomagnesemia 01/28/2018   Intractable nausea and vomiting 01/22/2018   Intrauterine growth restriction (IUGR) affecting care of mother 12/22/2017   Lung nodule seen on imaging study    Recommended follow-up 3 to 6 months (May - August 2025) for repeat chest CT.   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 A2/B GDM)  Required Referrals for A1GDM or A2GDM: [x]  Diabetes Education and Testing Supplies [x]  Nutrition Cousult  For A2/B GDM or higher classes of DM [x]  Diabetes Education and Testing Supplies [x]  Nutrition Counsult [x]  Fetal ECHO after 20 weeks  [x]  Eye exam for retina evaluation - severe retinopathy 7/19  Base   Ventricular septal defect (VSD) of fetus in singleton pregnancy, antepartum 09/30/2017   May go to newborn  nursery per Dr. [place] Bing Echo prior to discharge   Past Surgical History:  Procedure Laterality Date   25 GAUGE PARS PLANA VITRECTOMY WITH 20 GAUGE MVR PORT FOR MACULAR HOLE Left 07/20/2018   Procedure: 25 GAUGE PARS PLANA VITRECTOMY LEFT EYE;  Surgeon: Rennis Chris, MD;  Location: [facility] OR;  Service: Ophthalmology;  Laterality: Left;   AV FISTULA PLACEMENT Left 08/11/2021   Procedure: INSERTION OF LEFT ARM ARTERIOVENOUS (AV) GORE-TEX GRAFT;  Surgeon: Victorino Sparrow, MD;  Location: [facility] OR;  Service: Vascular;  Laterality: Left;  PERIPHERAL NERVE BLOCK   AV FISTULA PLACEMENT Right 08/24/2022   Procedure: RIGHT UPPER EXTREMITY ARTERIOVENOUS (AV) FISTULA CREATION;  Surgeon: Chuck Hint, MD;  Location: [facility] OR;  Service: Vascular;  Laterality: Right;   AVGG REMOVAL Left 06/22/2022   Procedure: REMOVAL OF ARTERIOVENOUS GORETEX GRAFT (AVGG) LEFT ARM WITH VEIN PATCH OF BRACHIAL ARTERY;  Surgeon: Chuck Hint, MD;  Location: [facility] OR;  Service: Vascular;  Laterality: Left;   BASCILIC VEIN TRANSPOSITION Right 11/29/2022   Procedure: RIGHT ARM BASILIC VEIN TRANSPOSITION;  Surgeon: Victorino Sparrow, MD;  Location: [facility] OR;  Service: Vascular;  Laterality: Right;   CAPD INSERTION N/A 09/10/2020   Procedure: LAPAROSCOPIC INSERTION CONTINUOUS AMBULATORY PERITONEAL DIALYSIS  (CAPD) CATHETER;  Surgeon: Nada Libman, MD;  Location: [facility] OR;  Service: Vascular;  Laterality: N/A;   CESAREAN SECTION N/A 12/26/2017   Procedure: CESAREAN SECTION;  Surgeon: Tereso Newcomer, MD;  Location: [facility];  Service: Obstetrics;  Laterality: N/A;   EYE EXAMINATION UNDER ANESTHESIA Right 07/20/2018   Procedure: Eye Exam Under Anesthesia RIGHT EYE;  Surgeon: Rennis Chris, MD;  Location: [facility] OR;  Service: Ophthalmology;  Laterality: Right;   EYE SURGERY Left 07/2018   GAS INSERTION Left 07/19/2019   Procedure: INSERTION OF GAS;  Surgeon: Rennis Chris, MD;  Location: [facility] OR;  Service: Ophthalmology;  Laterality: Left;   SF6   INJECTION OF SILICONE OIL Left 07/20/2018   Procedure: Injection Of Silicone Oil LEFT EYE;  Surgeon: Rennis Chris, MD;  Location: [facility] OR;  Service: Ophthalmology;  Laterality: Left;   INSERTION OF DIALYSIS CATHETER Right 07/05/2021   Procedure: REMOVAL OF DIALYSIS CATHETER;  Surgeon: Leonie Douglas, MD;  Location: [facility] OR;  Service: Vascular;  Laterality: Right;   INSERTION OF DIALYSIS CATHETER Right 07/09/2021   Procedure: INSERTION OF TUNNELED DIALYSIS CATHETER;  Surgeon: Leonie Douglas, MD;  Location: [facility] OR;  Service: Vascular;  Laterality: Right;   INSERTION OF DIALYSIS CATHETER Left 07/09/2021   Procedure: INSERTION OF TUNNELED  DIALYSIS CATHETER;  Surgeon: Leonie Douglas, MD;  Location: [facility] OR;  Service: Vascular;  Laterality: Left;   INSERTION OF DIALYSIS CATHETER N/A 06/22/2022   Procedure: INSERTION OF LEFT INTERNAL JUGULAR TRIALYSIS DIALYSIS CATHETER;  Surgeon: Chuck Hint, MD;  Location: [facility] OR;  Service: Vascular;  Laterality: N/A;   INSERTION OF DIALYSIS CATHETER Left 06/28/2022   Procedure: ULTRASOUND GUIDED INSERTION OF TUNNELED DIALYSIS CATHETER;  Surgeon: Leonie Douglas, MD;  Location: [facility] OR;  Service: Vascular;  Laterality: Left;   INSERTION OF DIALYSIS CATHETER Left 11/29/2022   Procedure: INSERTION OF TUNNELED DIALYSIS CATHETER TO LEFT FEMORAL VEIN;  Surgeon: Victorino Sparrow, MD;  Location: [facility] OR;  Service: Vascular;  Laterality: Left;   IR FLUORO GUIDE CV LINE RIGHT  06/02/2020   IR US GUIDE VASC ACCESS RIGHT  06/02/2020   LASER PHOTO ABLATION Right 07/20/2018   Procedure: Laser Photo Ablation RIGHT EYE;  Surgeon: Rennis Chris, MD;  Location: [facility] OR;  Service: Ophthalmology;  Laterality: Right;   MEMBRANE PEEL Left 07/20/2018   Procedure: Membrane Peel LEFT EYE;  Surgeon: Rennis Chris, MD;  Location: [facility] OR;  Service: Ophthalmology;  Laterality: Left;   MEMBRANE PEEL Left 07/19/2019   Procedure: MEMBRANE PEEL;  Surgeon: Rennis Chris, MD;  Location: [facility] OR;  Service:  Ophthalmology;  Laterality: Left;   MITOMYCIN C APPLICATION Bilateral 07/20/2018   Procedure: Avastin Application;  Surgeon: Rennis Chris, MD;  Location: [facility] OR;  Service: Ophthalmology;  Laterality: Bilateral;   PHOTOCOAGULATION WITH LASER Left 07/20/2018   Procedure: Photocoagulation With Laser LEFT EYE;  Surgeon: Rennis Chris, MD;  Location: [facility] OR;  Service: Ophthalmology;  Laterality: Left;   PHOTOCOAGULATION WITH LASER Left 07/19/2019   Procedure: PHOTOCOAGULATION WITH LASER;  Surgeon: Rennis Chris, MD;  Location: [facility] OR;  Service: Ophthalmology;  Laterality: Left;   RETINAL DETACHMENT SURGERY Left 07/20/2018   Dr. Rennis Chris   SILICON OIL REMOVAL Left 07/19/2019   Procedure: 25g PARS PLANA VITRECTOMY WITH SILICON OIL REMOVAL;  Surgeon: Rennis Chris, MD;  Location: [facility] OR;  Service: Ophthalmology;  Laterality: Left;   TEE WITHOUT CARDIOVERSION N/A 07/08/2021   Procedure: TRANSESOPHAGEAL ECHOCARDIOGRAM (TEE);  Surgeon: Little Ishikawa, MD;  Location: [facility] ENDOSCOPY;  Service: Cardiovascular;  Laterality: N/A;   TEE WITHOUT CARDIOVERSION N/A 06/29/2022   Procedure: TRANSESOPHAGEAL ECHOCARDIOGRAM;  Surgeon: Wendall Stade, MD;  Location: [facility] INVASIVE CV LAB;  Service: Cardiovascular;  Laterality: N/A;  WISDOM TOOTH EXTRACTION     Patient Active Problem List   Diagnosis Date Noted   Lung nodule seen on imaging study    Chronic midline thoracic back pain 02/02/2023   Appendicitis with nonoperative management 01/15/2023   Displacement of vascular dialysis catheter (HCC) 11/28/2022   Pulmonary cavitary lesion 07/26/2022   Medication management 07/26/2022   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) 10/13/2021   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) 10/13/2021   Greater trochanteric pain syndrome of right lower extremity 07/23/2021   Hyperlipidemia 04/20/2021   Anxiety and depression 02/04/2021   DKA, type 1 (HCC) 11/10/2020   Type 1 diabetes  mellitus with hyperglycemia (HCC) 11/10/2020   ESRD on hemodialysis (HCC) 11/10/2020   Anemia due to stage 5 chronic kidney disease (HCC) 03/04/2020   Intractable nausea and vomiting 01/22/2018   Essential hypertension 08/21/2017   Diabetic retinopathy (HCC) 06/09/2017   Asthma 10/30/2013    PCP: Reymundo Poll, MD   REFERRING PROVIDER: Reymundo Poll, MD  REFERRING DIAG: [phone] (ICD-10-CM) - Chronic midline thoracic back pain  Rationale for Evaluation and Treatment: Rehabilitation  THERAPY DIAG:  No diagnosis found.  ONSET DATE: chronic   SUBJECTIVE:                                                                                                                                                                                           SUBJECTIVE STATEMENT:   EVAL: Pt noticed increased Rt sided back and neck pain about 3 mos ago.  She was hospitalized end of Dec. and I really noticed the pain starting in the Rt side of her middle back. The pain does not radiate into her arms or into her low back.  "I notice it more when I'm laying down or sitting in the chair for dialysis" -3 hours at a time. The pain prevents her from using her arms for ADLs, playing with her son, doing home taks and begin comfortable at rest, sleeping. No imaging done at this time.   PERTINENT HISTORY:  Recent appendicitis, Diabetes, ESRF Anxiety, depression, hip pain     PAIN:  Are you having pain? Yes: NPRS scale: 6/10 Pain location: mid back , Rt upper trap  Pain description: uncomfortable, tight  Aggravating factors: sitting and lying down Relieving factors: Ben gay cream, has tried heat, sometimes moving it   PRECAUTIONS: Other: Port for dialysis 4 x per week   RED FLAGS: None   WEIGHT BEARING RESTRICTIONS: No  FALLS:  Has patient fallen in last 6 months? No  LIVING ENVIRONMENT: Lives with: lives with their partner  and lives with their son Lives in: House/apartment Stairs: Yes:  Internal: 12 steps; no issues  Has following equipment at home: None  OCCUPATION: not working  PLOF: Independent and Leisure: hangs out with her 33 yr old, limited due to medical issues   PATIENT GOALS: I would like to feel better   NEXT MD VISIT: Internal medicine, a few mos   OBJECTIVE:  Note: Objective measures were completed at Evaluation unless otherwise noted.  DIAGNOSTIC FINDINGS:  None   PATIENT SURVEYS:  Modified Oswestry 21/50 (42%)   COGNITION: Overall cognitive status: Within functional limits for tasks assessed     SENSATION: WFL  POSTURE: forward head and elevated scapula   PALPATION: Pain with palpation to Rt upper trap, rhomboid and lower trapezius Paraspinals tense along TL spine   LUMBAR ROM:   AROM eval  Flexion WFL with pain   Extension Limited 25% no pain   Right lateral flexion   Left lateral flexion   Right rotation Stiff  Left rotation Stiff    (Blank rows = not tested)  LOWER EXTREMITY ROM:   WNL   LOWER EXTREMITY MMT:    MMT Right eval Left eval  Hip flexion 4/5 4-/5 pain   Hip extension    Hip abduction    Hip adduction    Hip internal rotation    Hip external rotation    Knee flexion 4+/5 4+/5  Knee extension 5/5 5/5  Ankle dorsiflexion    Ankle plantarflexion    Ankle inversion    Ankle eversion     (Blank rows = not tested)   UPPER EXTREMITY ROM: WNL   Active ROM Right 03/01/2023 Left 03/01/2023  Shoulder flexion    Shoulder extension    Shoulder abduction    Shoulder adduction    Shoulder internal rotation    Shoulder external rotation     UPPER EXTREMITY MMT:  MMT Right 03/01/2023 Left 03/01/2023  Shoulder flexion 4 4  Shoulder extension    Shoulder abduction 4- 4  Shoulder adduction    Shoulder internal rotation    Shoulder external rotation    Middle trapezius    Lower trapezius    Elbow flexion 4+ 4+  Elbow extension      LUMBAR SPECIAL TESTS:  NT   FUNCTIONAL TESTS:  5 times sit to stand:  13 sec  SLS 10-15 sec each LE      GAIT: Distance walked: 100 Assistive device utilized: None Level of assistance: Modified independence Comments: slow pace , decreased trunk rotation   TREATMENT DATE: [facility] Adult PT Treatment:                                                DATE: 03/03/23 Therapeutic Exercise: *** Manual Therapy: *** Neuromuscular re-ed: *** Therapeutic Activity: *** Modalities: *** Self Care: ***  [facility] Adult PT Treatment:                                                DATE: 02/23/23 Self care: Open book  x 3 each side Corner stretch x 2  Upper trap and levator scap stretch each side x 2  Pt education see below  PATIENT EDUCATION:  Education details: POC, HEP, dry needling, manual therapy, joint crepitus , use pillows during dialysis for arms, neck, posture  Person educated: Patient Education method: Explanation, Demonstration, Verbal cues, and Handouts Education comprehension: verbalized understanding, returned demonstration, and needs further education  HOME EXERCISE PROGRAM: Access Code: [email or web address] URL: [email or web address] Date: 02/23/2023 Prepared by: Karie Mainland  Exercises - Upper Trapezius Stretch  - 1-2 x daily - 7 x weekly - 1 sets - 3 reps - 30 hold - Gentle Levator Scapulae Stretch  - 1-2 x daily - 7 x weekly - 1 sets - 3 reps - 30 hold - Seated Scapular Retraction  - 3-5 x daily - 7 x weekly - 1 sets - 5-10 reps - 10 hold - Corner Pec Major Stretch  - 1-2 x daily - 7 x weekly - 1 sets - 3 reps - 30 hold - Sidelying Thoracic Rotation with Open Book  - 1 x daily - 7 x weekly - 1 sets - 5 reps - 10-15 hold  ASSESSMENT:  CLINICAL IMPRESSION:   EVAL: Patient is a 33 y.o. female who was seen today for physical therapy evaluation and treatment for mid /upper back pain.   OBJECTIVE IMPAIRMENTS: decreased  activity tolerance, decreased endurance, decreased knowledge of condition, decreased mobility, decreased ROM, decreased strength, increased fascial restrictions, increased muscle spasms, impaired flexibility, impaired UE functional use, postural dysfunction, and pain.   ACTIVITY LIMITATIONS: carrying, lifting, bending, sitting, squatting, locomotion level, and caring for others  PARTICIPATION LIMITATIONS: cleaning, interpersonal relationship, driving, shopping, and community activity  PERSONAL FACTORS: 3+ comorbidities: Diabetes, neuropathy, kidney disease  are also affecting patient's functional outcome.   REHAB POTENTIAL: Excellent  CLINICAL DECISION MAKING: Evolving/moderate complexity  EVALUATION COMPLEXITY: Moderate   GOALS: Goals reviewed with patient? Yes   LONG TERM GOALS: Target date: 03/23/2023  Pt will be I with HEP  Baseline: given on eval  Goal status: INITIAL  2.  Pt will be able to demo Rt and Lt UE strength to 4+/5 ore more without increased back pain  Baseline: 4-/5 to 4/5  Goal status: INITIAL  3.  Pt will be able to sit for dialysis without increasing Rt upper back pain (3 hours) , pain moderate to severe  Baseline: recommend neck support  Goal status: INITIAL  4.  Pt will be able to perform childcare and home tasks without increased pain  in Rt upper/mid  back  Baseline: causes pain  Goal status: INITIAL  5.  ODI will improve by 8% or more to demo improved functional mobility  Baseline: 42% (goal is 32%)  Goal status: INITIAL   PLAN:  PT FREQUENCY: 2x/week  PT DURATION: 4 weeks  PLANNED INTERVENTIONS: [id number]- PT Re-evaluation, 97110-Therapeutic exercises, 97530- Therapeutic activity, 97112- Neuromuscular re-education, 97535- Self Care, [id number]- Manual therapy, Patient/Family education, Balance training, Taping, Dry Needling, Spinal mobilization, Cryotherapy, and Moist heat.  PLAN FOR NEXT SESSION: check HEP. Begin upper back/scap strength with bands .     Joellyn Rued, PT 03/01/2023, 1:59 PM

## 2023-03-02 ENCOUNTER — Encounter (INDEPENDENT_AMBULATORY_CARE_PROVIDER_SITE_OTHER): Payer: Medicare (Managed Care) | Admitting: Ophthalmology

## 2023-03-02 DIAGNOSIS — H26492 Other secondary cataract, left eye: Secondary | ICD-10-CM

## 2023-03-02 DIAGNOSIS — Z961 Presence of intraocular lens: Secondary | ICD-10-CM

## 2023-03-02 DIAGNOSIS — H35372 Puckering of macula, left eye: Secondary | ICD-10-CM

## 2023-03-02 DIAGNOSIS — H4313 Vitreous hemorrhage, bilateral: Secondary | ICD-10-CM

## 2023-03-02 DIAGNOSIS — H35033 Hypertensive retinopathy, bilateral: Secondary | ICD-10-CM

## 2023-03-02 DIAGNOSIS — H3342 Traction detachment of retina, left eye: Secondary | ICD-10-CM

## 2023-03-02 DIAGNOSIS — E103513 Type 1 diabetes mellitus with proliferative diabetic retinopathy with macular edema, bilateral: Secondary | ICD-10-CM

## 2023-03-02 DIAGNOSIS — Z794 Long term (current) use of insulin: Secondary | ICD-10-CM

## 2023-03-02 DIAGNOSIS — I10 Essential (primary) hypertension: Secondary | ICD-10-CM

## 2023-03-02 DIAGNOSIS — H25811 Combined forms of age-related cataract, right eye: Secondary | ICD-10-CM

## 2023-03-03 ENCOUNTER — Ambulatory Visit: Payer: Medicare (Managed Care)

## 2023-03-04 NOTE — Therapy (Addendum)
 OUTPATIENT PHYSICAL THERAPY THORACOLUMBAR TREATMENT/DISCHARGE   Patient Name: Barbara Donovan MRN: [id number] DOB:[birth year], 33 y.o., female Today's Date: 03/07/2023      PHYSICAL THERAPY DISCHARGE SUMMARY  Visits from Start of Care: 2  Current functional level related to goals / functional outcomes: unknown   Remaining deficits: See below, unknown   Education / Equipment: HEP    Patient agrees to discharge. Patient goals were not met. Patient is being discharged due to a change in medical status. Did not return.    END OF SESSION:  PT End of Session - 03/07/23 1456     Visit Number 2    Number of Visits 8    Date for PT Re-Evaluation 03/23/23    Authorization Type [employer] MCR/MCD    PT Start Time 1500    PT Stop Time 1555    PT Time Calculation (min) 55 min    Activity Tolerance Patient tolerated treatment well;Patient limited by fatigue;Other (comment)    Behavior During Therapy Flat affect              Past Medical History:  Diagnosis Date   Anxiety    Asthma    as a child, no problems as an adult, no inhaler   Cataract    NS OU   Chronic hypertension during pregnancy, antepartum 08/19/2017   Dehydration 01/28/2018   Depression    Depression during pregnancy, antepartum 07/07/2017   6/20: Short trial of zoloft previously, reports didn't help much but also didn't give it a chance Discussed r/b/a SSRIs in pregnancy, agrees to try Zoloft again, rx sent No SI/HI/red flags   Diabetes (HCC)    TYPE I. 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   ESRD on peritoneal dialysis (HCC)    HTN (hypertension)    Hypertensive retinopathy    OU   Hypokalemia 01/22/2018   Hypomagnesemia 01/28/2018   Intractable nausea and vomiting 01/22/2018   Intrauterine growth restriction (IUGR) affecting care of mother 12/22/2017   Lung nodule seen on imaging study     Recommended follow-up 3 to 6 months (May - August 2025) for repeat chest CT.   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 A2/B GDM)  Required Referrals for A1GDM or A2GDM: [x]  Diabetes Education and Testing Supplies [x]  Nutrition Cousult  For A2/B GDM or higher classes of DM [x]  Diabetes Education and Testing Supplies [x]  Nutrition Counsult [x]  Fetal ECHO after 20 weeks  [x]  Eye exam for retina evaluation - severe retinopathy 7/19  Base   Ventricular septal defect (VSD) of fetus in singleton pregnancy, antepartum 09/30/2017   May go to newborn nursery per Dr. [place] Bing Echo prior to discharge   Past Surgical History:  Procedure Laterality Date   25 GAUGE PARS PLANA VITRECTOMY WITH 20 GAUGE MVR PORT FOR MACULAR HOLE Left 07/20/2018   Procedure: 25 GAUGE PARS PLANA VITRECTOMY LEFT EYE;  Surgeon: Rennis Chris, MD;  Location: [facility] OR;  Service: Ophthalmology;  Laterality: Left;   AV FISTULA PLACEMENT Left 08/11/2021   Procedure: INSERTION OF LEFT ARM ARTERIOVENOUS (AV) GORE-TEX GRAFT;  Surgeon: Victorino Sparrow, MD;  Location: [facility] OR;  Service: Vascular;  Laterality: Left;  PERIPHERAL NERVE BLOCK  AV FISTULA PLACEMENT Right 08/24/2022   Procedure: RIGHT UPPER EXTREMITY ARTERIOVENOUS (AV) FISTULA CREATION;  Surgeon: Chuck Hint, MD;  Location: [facility] OR;  Service: Vascular;  Laterality: Right;   AVGG REMOVAL Left 06/22/2022   Procedure: REMOVAL OF ARTERIOVENOUS GORETEX GRAFT (AVGG) LEFT ARM WITH VEIN PATCH OF BRACHIAL ARTERY;  Surgeon: Chuck Hint, MD;  Location: [facility] OR;  Service: Vascular;  Laterality: Left;   BASCILIC VEIN TRANSPOSITION Right 11/29/2022   Procedure: RIGHT ARM BASILIC VEIN TRANSPOSITION;  Surgeon: Victorino Sparrow, MD;  Location: [facility] OR;  Service: Vascular;   Laterality: Right;   CAPD INSERTION N/A 09/10/2020   Procedure: LAPAROSCOPIC INSERTION CONTINUOUS AMBULATORY PERITONEAL DIALYSIS  (CAPD) CATHETER;  Surgeon: Nada Libman, MD;  Location: [facility] OR;  Service: Vascular;  Laterality: N/A;   CESAREAN SECTION N/A 12/26/2017   Procedure: CESAREAN SECTION;  Surgeon: Tereso Newcomer, MD;  Location: [facility];  Service: Obstetrics;  Laterality: N/A;   EYE EXAMINATION UNDER ANESTHESIA Right 07/20/2018   Procedure: Eye Exam Under Anesthesia RIGHT EYE;  Surgeon: Rennis Chris, MD;  Location: [facility] OR;  Service: Ophthalmology;  Laterality: Right;   EYE SURGERY Left 07/2018   GAS INSERTION Left 07/19/2019   Procedure: INSERTION OF GAS;  Surgeon: Rennis Chris, MD;  Location: [facility] OR;  Service: Ophthalmology;  Laterality: Left;  SF6   INJECTION OF SILICONE OIL Left 07/20/2018   Procedure: Injection Of Silicone Oil LEFT EYE;  Surgeon: Rennis Chris, MD;  Location: [facility] OR;  Service: Ophthalmology;  Laterality: Left;   INSERTION OF DIALYSIS CATHETER Right 07/05/2021   Procedure: REMOVAL OF DIALYSIS CATHETER;  Surgeon: Leonie Douglas, MD;  Location: [facility] OR;  Service: Vascular;  Laterality: Right;   INSERTION OF DIALYSIS CATHETER Right 07/09/2021   Procedure: INSERTION OF TUNNELED DIALYSIS CATHETER;  Surgeon: Leonie Douglas, MD;  Location: [facility] OR;  Service: Vascular;  Laterality: Right;   INSERTION OF DIALYSIS CATHETER Left 07/09/2021   Procedure: INSERTION OF TUNNELED  DIALYSIS CATHETER;  Surgeon: Leonie Douglas, MD;  Location: [facility] OR;  Service: Vascular;  Laterality: Left;   INSERTION OF DIALYSIS CATHETER N/A 06/22/2022   Procedure: INSERTION OF LEFT INTERNAL JUGULAR TRIALYSIS DIALYSIS CATHETER;  Surgeon: Chuck Hint, MD;  Location: [facility] OR;  Service: Vascular;  Laterality: N/A;   INSERTION OF DIALYSIS CATHETER Left 06/28/2022   Procedure: ULTRASOUND GUIDED INSERTION OF TUNNELED DIALYSIS CATHETER;  Surgeon: Leonie Douglas, MD;  Location: [facility] OR;  Service:  Vascular;  Laterality: Left;   INSERTION OF DIALYSIS CATHETER Left 11/29/2022   Procedure: INSERTION OF TUNNELED DIALYSIS CATHETER TO LEFT FEMORAL VEIN;  Surgeon: Victorino Sparrow, MD;  Location: [facility] OR;  Service: Vascular;  Laterality: Left;   IR FLUORO GUIDE CV LINE RIGHT  06/02/2020   IR US GUIDE VASC ACCESS RIGHT  06/02/2020   LASER PHOTO ABLATION Right 07/20/2018   Procedure: Laser Photo Ablation RIGHT EYE;  Surgeon: Rennis Chris, MD;  Location: [facility] OR;  Service: Ophthalmology;  Laterality: Right;   MEMBRANE PEEL Left 07/20/2018   Procedure: Membrane Peel LEFT EYE;  Surgeon: Rennis Chris, MD;  Location: [facility] OR;  Service: Ophthalmology;  Laterality: Left;   MEMBRANE PEEL Left 07/19/2019   Procedure: MEMBRANE PEEL;  Surgeon: Rennis Chris, MD;  Location: [facility] OR;  Service: Ophthalmology;  Laterality: Left;   MITOMYCIN C APPLICATION Bilateral 07/20/2018   Procedure: Avastin Application;  Surgeon: Rennis Chris, MD;  Location: [facility] OR;  Service: Ophthalmology;  Laterality: Bilateral;   PHOTOCOAGULATION WITH  LASER Left 07/20/2018   Procedure: Photocoagulation With Laser LEFT EYE;  Surgeon: Rennis Chris, MD;  Location: [facility] OR;  Service: Ophthalmology;  Laterality: Left;   PHOTOCOAGULATION WITH LASER Left 07/19/2019   Procedure: PHOTOCOAGULATION WITH LASER;  Surgeon: Rennis Chris, MD;  Location: [facility] OR;  Service: Ophthalmology;  Laterality: Left;   RETINAL DETACHMENT SURGERY Left 07/20/2018   Dr. Rennis Chris   SILICON OIL REMOVAL Left 07/19/2019   Procedure: 25g PARS PLANA VITRECTOMY WITH SILICON OIL REMOVAL;  Surgeon: Rennis Chris, MD;  Location: [facility] OR;  Service: Ophthalmology;  Laterality: Left;   TEE WITHOUT CARDIOVERSION N/A 07/08/2021   Procedure: TRANSESOPHAGEAL ECHOCARDIOGRAM (TEE);  Surgeon: Little Ishikawa, MD;  Location: [facility]COPY;  Service: Cardiovascular;  Laterality: N/A;   TEE WITHOUT CARDIOVERSION N/A 06/29/2022   Procedure: TRANSESOPHAGEAL ECHOCARDIOGRAM;  Surgeon: Wendall Stade, MD;   Location: [facility] INVASIVE CV LAB;  Service: Cardiovascular;  Laterality: N/A;   WISDOM TOOTH EXTRACTION     Patient Active Problem List   Diagnosis Date Noted   Lung nodule seen on imaging study    Chronic midline thoracic back pain 02/02/2023   Appendicitis with nonoperative management 01/15/2023   Displacement of vascular dialysis catheter (HCC) 11/28/2022   Pulmonary cavitary lesion 07/26/2022   Medication management 07/26/2022   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) 10/13/2021   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) 10/13/2021   Greater trochanteric pain syndrome of right lower extremity 07/23/2021   Hyperlipidemia 04/20/2021   Anxiety and depression 02/04/2021   DKA, type 1 (HCC) 11/10/2020   Type 1 diabetes mellitus with hyperglycemia (HCC) 11/10/2020   ESRD on hemodialysis (HCC) 11/10/2020   Anemia due to stage 5 chronic kidney disease (HCC) 03/04/2020   Intractable nausea and vomiting 01/22/2018   Essential hypertension 08/21/2017   Diabetic retinopathy (HCC) 06/09/2017   Asthma 10/30/2013    PCP: Reymundo Poll, MD   REFERRING PROVIDER: Reymundo Poll, MD  REFERRING DIAG: [phone] (ICD-10-CM) - Chronic midline thoracic back pain  Rationale for Evaluation and Treatment: Rehabilitation  THERAPY DIAG:  Pain in thoracic spine  Cramp and spasm  Muscle weakness (generalized)  ONSET DATE: chronic   SUBJECTIVE:                                                                                                                                                                                           SUBJECTIVE STATEMENT: Pt fell x 2 last week . Fell in the kitchen Sunday, landed on Rt side, elbow.  And then Friday pt slipped on ice as she got out of her car.  Pain in upper back is 6/10.  Pain in arms at times but maybe it is from the fall.    EVAL: Pt noticed increased Rt sided back and neck pain about 3 mos ago.  She  was hospitalized end of Dec. and I really noticed the pain starting in the Rt side of her middle back. The pain does not radiate into her arms or into her low back.  "I notice it more when I'm laying down or sitting in the chair for dialysis" -3 hours at a time. The pain prevents her from using her arms for ADLs, playing with her son, doing home taks and begin comfortable at rest, sleeping. No imaging done at this time.   PERTINENT HISTORY:  Recent appendicitis, Diabetes, ESRF Anxiety, depression, hip pain     PAIN:  Are you having pain? Yes: NPRS scale: 6/10 Pain location: mid back , Rt upper trap  Pain description: uncomfortable, tight  Aggravating factors: sitting and lying down Relieving factors: Ben gay cream, has tried heat, sometimes moving it   PRECAUTIONS: Other: Port for dialysis 4 x per week   RED FLAGS: None   WEIGHT BEARING RESTRICTIONS: No  FALLS:  Has patient fallen in last 6 months? No  LIVING ENVIRONMENT: Lives with: lives with their partner and lives with their son Lives in: House/apartment Stairs: Yes: Internal: 12 steps; no issues  Has following equipment at home: None  OCCUPATION: not working  PLOF: Independent and Leisure: hangs out with her 33 yr old, limited due to medical issues   PATIENT GOALS: I would like to feel better   NEXT MD VISIT: Internal medicine, a few mos   OBJECTIVE:  Note: Objective measures were completed at Evaluation unless otherwise noted.  DIAGNOSTIC FINDINGS:  None   PATIENT SURVEYS:  Modified Oswestry 21/50 (42%)   COGNITION: Overall cognitive status: Within functional limits for tasks assessed     SENSATION: WFL  POSTURE: forward head and elevated scapula   PALPATION: Pain with palpation to Rt upper trap, rhomboid and lower trapezius Paraspinals tense along TL spine   LUMBAR ROM:   AROM eval  Flexion WFL with pain   Extension Limited 25% no pain   Right lateral flexion   Left lateral flexion   Right  rotation Stiff  Left rotation Stiff    (Blank rows = not tested)  LOWER EXTREMITY ROM:   WNL   LOWER EXTREMITY MMT:    MMT Right eval Left eval  Hip flexion 4/5 4-/5 pain   Hip extension    Hip abduction    Hip adduction    Hip internal rotation    Hip external rotation    Knee flexion 4+/5 4+/5  Knee extension 5/5 5/5  Ankle dorsiflexion    Ankle plantarflexion    Ankle inversion    Ankle eversion     (Blank rows = not tested)   UPPER EXTREMITY ROM: WNL   Active ROM Right 03/07/2023 Left 03/07/2023  Shoulder flexion    Shoulder extension    Shoulder abduction    Shoulder adduction    Shoulder internal rotation    Shoulder external rotation     UPPER EXTREMITY MMT:  MMT Right 03/07/2023 Left 03/07/2023  Shoulder flexion 4 4  Shoulder extension    Shoulder abduction 4- 4  Shoulder adduction    Shoulder internal rotation    Shoulder external rotation    Middle trapezius    Lower trapezius    Elbow flexion 4+ 4+  Elbow extension      LUMBAR SPECIAL TESTS:  NT   FUNCTIONAL TESTS:  5 times sit to stand: 13 sec  SLS 10-15 sec each LE      GAIT: Distance walked: 100 Assistive device utilized: None Level of assistance: Modified independence Comments: slow pace , decreased trunk rotation   TREATMENT DATE: [facility] Adult PT Treatment:                                                DATE: 03/07/23 Therapeutic Activity: NuStep L5 UE and LE for 5 min  Seated scap retraction Upper trap  Levator scap stretch  Narrow grip overhead lift red band x 10  Horizontal abd red  ER red band  Open book x 5 each side  Standing row x 15  Extension  x 10  Corner Stretch x 3  Modalities: MHP 10 min   [facility] Adult PT Treatment:                                                DATE: 02/23/23 Self care: Open book  x 3 each side Corner stretch x 2  Upper trap and levator scap stretch each side x 2  Pt education see below                                                                                                                             PATIENT EDUCATION:  Education details: POC, HEP, dry needling, manual therapy, joint crepitus , use pillows during dialysis for arms, neck, posture  Person educated: Patient Education method: Programmer, multimedia, Demonstration, Verbal cues, and Handouts Education comprehension: verbalized understanding, returned demonstration, and needs further education  HOME EXERCISE PROGRAM: Access Code: [email or web address] URL: [email or web address] Date: 02/23/2023 Prepared by: Karie Mainland  Exercises - Upper Trapezius Stretch  - 1-2 x daily - 7 x weekly - 1 sets - 3 reps - 30 hold - Gentle Levator Scapulae Stretch  - 1-2 x daily - 7 x weekly - 1 sets - 3 reps - 30 hold - Seated Scapular Retraction  - 3-5 x daily - 7 x weekly - 1 sets - 5-10 reps - 10 hold - Corner Pec Major Stretch  - 1-2 x daily - 7 x weekly - 1 sets - 3 reps - 30 hold - Sidelying Thoracic Rotation with Open Book  - 1 x daily - 7 x weekly - 1 sets - 5 reps - 10-15 hold -bands  ASSESSMENT:  CLINICAL IMPRESSION: Patient tolerated the session well. She was given red bands for HEP to address upper back weakness.  Falls x 2 since last visit, due to slipping on ice and also a wrapper on  the floor in her kitchen.  She needed only min cueing to reduce excessive upper trap tension.   EVAL: Patient is a 32 y.o. female who was seen today for physical therapy evaluation and treatment for mid /upper back pain.   OBJECTIVE IMPAIRMENTS: decreased activity tolerance, decreased endurance, decreased knowledge of condition, decreased mobility, decreased ROM, decreased strength, increased fascial restrictions, increased muscle spasms, impaired flexibility, impaired UE functional use, postural dysfunction, and pain.   ACTIVITY LIMITATIONS: carrying, lifting, bending, sitting, squatting, locomotion level, and caring for others  PARTICIPATION LIMITATIONS: cleaning, interpersonal  relationship, driving, shopping, and community activity  PERSONAL FACTORS: 3+ comorbidities: Diabetes, neuropathy, kidney disease  are also affecting patient's functional outcome.   REHAB POTENTIAL: Excellent  CLINICAL DECISION MAKING: Evolving/moderate complexity  EVALUATION COMPLEXITY: Moderate   GOALS: Goals reviewed with patient? Yes   LONG TERM GOALS: Target date: 03/23/2023  Pt will be I with HEP  Baseline: given on eval  Goal status: INITIAL  2.  Pt will be able to demo Rt and Lt UE strength to 4+/5 ore more without increased back pain  Baseline: 4-/5 to 4/5  Goal status: INITIAL  3.  Pt will be able to sit for dialysis without increasing Rt upper back pain (3 hours) , pain moderate to severe  Baseline: recommend neck support  Goal status: INITIAL  4.  Pt will be able to perform childcare and home tasks without increased pain  in Rt upper/mid  back  Baseline: causes pain  Goal status: INITIAL  5.  ODI will improve by 8% or more to demo improved functional mobility  Baseline: 42% (goal is 32%)  Goal status: INITIAL   PLAN:  PT FREQUENCY: 2x/week  PT DURATION: 4 weeks  PLANNED INTERVENTIONS: 97164- PT Re-evaluation, 97110-Therapeutic exercises, 97530- Therapeutic activity, 97112- Neuromuscular re-education, 97535- Self Care, 16109- Manual therapy, Patient/Family education, Balance training, Taping, Dry Needling, Spinal mobilization, Cryotherapy, and Moist heat.  PLAN FOR NEXT SESSION: check HEP. Check upper back/scap strength with bands .    Allegra Cerniglia, PT 03/07/2023, 8:17 PM   Karie Mainland, PT 03/07/23 8:21 PM Phone: [phone] Fax: [phone]

## 2023-03-07 ENCOUNTER — Encounter: Payer: Self-pay | Admitting: Physical Therapy

## 2023-03-07 ENCOUNTER — Ambulatory Visit: Payer: Medicare (Managed Care) | Admitting: Physical Therapy

## 2023-03-07 DIAGNOSIS — M546 Pain in thoracic spine: Secondary | ICD-10-CM

## 2023-03-07 DIAGNOSIS — R252 Cramp and spasm: Secondary | ICD-10-CM

## 2023-03-07 DIAGNOSIS — M6281 Muscle weakness (generalized): Secondary | ICD-10-CM

## 2023-03-09 ENCOUNTER — Ambulatory Visit: Payer: Medicare (Managed Care)

## 2023-03-10 ENCOUNTER — Encounter (INDEPENDENT_AMBULATORY_CARE_PROVIDER_SITE_OTHER): Payer: Medicare (Managed Care) | Admitting: Ophthalmology

## 2023-03-14 ENCOUNTER — Encounter: Payer: Medicare (Managed Care) | Admitting: Physical Therapy

## 2023-03-15 ENCOUNTER — Encounter (INDEPENDENT_AMBULATORY_CARE_PROVIDER_SITE_OTHER): Payer: Self-pay | Admitting: Ophthalmology

## 2023-03-15 ENCOUNTER — Ambulatory Visit (INDEPENDENT_AMBULATORY_CARE_PROVIDER_SITE_OTHER): Payer: Medicare (Managed Care) | Admitting: Ophthalmology

## 2023-03-15 DIAGNOSIS — H35372 Puckering of macula, left eye: Secondary | ICD-10-CM | POA: Diagnosis not present

## 2023-03-15 DIAGNOSIS — E103513 Type 1 diabetes mellitus with proliferative diabetic retinopathy with macular edema, bilateral: Secondary | ICD-10-CM | POA: Diagnosis not present

## 2023-03-15 DIAGNOSIS — Z794 Long term (current) use of insulin: Secondary | ICD-10-CM

## 2023-03-15 DIAGNOSIS — Z961 Presence of intraocular lens: Secondary | ICD-10-CM

## 2023-03-15 DIAGNOSIS — I10 Essential (primary) hypertension: Secondary | ICD-10-CM | POA: Diagnosis not present

## 2023-03-15 DIAGNOSIS — H4313 Vitreous hemorrhage, bilateral: Secondary | ICD-10-CM | POA: Diagnosis not present

## 2023-03-15 DIAGNOSIS — H35033 Hypertensive retinopathy, bilateral: Secondary | ICD-10-CM

## 2023-03-15 DIAGNOSIS — H3342 Traction detachment of retina, left eye: Secondary | ICD-10-CM

## 2023-03-15 DIAGNOSIS — H25811 Combined forms of age-related cataract, right eye: Secondary | ICD-10-CM

## 2023-03-15 NOTE — Progress Notes (Signed)
 [facility] - Clinic Note  03/15/2023     CHIEF COMPLAINT Patient presents for Retina Follow Up   HISTORY OF PRESENT ILLNESS: Barbara Donovan is a 33 y.o. female who presents to the clinic today for:   HPI     Retina Follow Up   Patient presents with  Diabetic Retinopathy.  In both eyes.  Severity is moderate.  Duration of 3 weeks.  Since onset it is stable.  I, the attending physician,  performed the HPI with the patient and updated documentation appropriately.        Comments   Patient states vision the same OU.      Last edited by Rennis Chris, MD on 03/15/2023 11:03 PM.    Patient does not feel like her vision improved after the Yag  Referring physician: Faith Rogue, DO [address] [place],  [place] [address]  HISTORICAL INFORMATION:   Selected notes from the MEDICAL RECORD NUMBER Referred by Dr. Lavone Neri for concern of bilateral CRVO   CURRENT MEDICATIONS: No current outpatient medications on file. (Ophthalmic Drugs)   No current facility-administered medications for this visit. (Ophthalmic Drugs)   Current Outpatient Medications (Other)  Medication Sig   acetaminophen (TYLENOL) 325 MG tablet Take 650 mg by mouth every 6 (six) hours as needed for mild pain.   amLODipine (NORVASC) 10 MG tablet Take 1 tablet (10 mg total) by mouth daily. (Patient taking differently: Take 10 mg by mouth at bedtime.)   calcitRIOL (ROCALTROL) 0.25 MCG capsule Take by mouth.   calcitRIOL (ROCALTROL) 0.5 MCG capsule Take 0.5 mcg by mouth 4 (four) times a week. Monday, Tuesday, Thursday, and Friday   carvedilol (COREG) 25 MG tablet Take 1 tablet (25 mg total) by mouth 2 (two) times daily.   ethyl chloride spray Apply 1 Application topically 4 (four) times a week. Apply to arm on Monday, Tuesday, Thursday, and Friday   hydrALAZINE (APRESOLINE) 100 MG tablet Take 100 mg by mouth 2 (two) times daily.   insulin degludec (TRESIBA FLEXTOUCH) 200 UNIT/ML FlexTouch Pen  Inject 14 Units into the skin daily in the afternoon.   insulin lispro (HUMALOG KWIKPEN) 100 UNIT/ML KwikPen Max daily 30 units   Insulin Pen Needle 32G X 4 MM MISC 1 Device by Does not apply route in the morning, at noon, in the evening, and at bedtime.   lidocaine (LMX) 4 % cream Apply 1 Application topically once a week. Apply on Monday,Tuesday, Thursday, and Friday   lidocaine-prilocaine (EMLA) cream SMARTSIG:sparingly Topical As Directed   losartan (COZAAR) 100 MG tablet Take 100 mg by mouth at bedtime.   metoCLOPramide (REGLAN) 10 MG tablet Take 1 tablet (10 mg total) by mouth every 6 (six) hours. (Patient taking differently: Take 10 mg by mouth every 6 (six) hours as needed for nausea or vomiting.)   mirtazapine (REMERON) 15 MG tablet Take 1 tablet (15 mg total) by mouth at bedtime. Do not take medicine in the morning and drive. (Patient taking differently: Take 15 mg by mouth at bedtime.)   multivitamin (RENA-VIT) TABS tablet Take 1 tablet by mouth at bedtime.   ondansetron (ZOFRAN) 8 MG tablet Take by mouth.   SENSIPAR 60 MG tablet Take 60 mg by mouth at bedtime.   sucroferric oxyhydroxide (VELPHORO) 500 MG chewable tablet Chew 1,500 mg by mouth 3 (three) times daily with meals.   No current facility-administered medications for this visit. (Other)   REVIEW OF SYSTEMS: ROS   Positive  for: Neurological, Genitourinary, Endocrine, Eyes, Respiratory Negative for: Constitutional, Gastrointestinal, Skin, Musculoskeletal, HENT, Cardiovascular, Psychiatric, Allergic/Imm, Heme/Lymph Last edited by Annalee Genta D, COT on 03/15/2023  7:40 AM.     ALLERGIES No Known Allergies  PAST MEDICAL HISTORY Past Medical History:  Diagnosis Date   Anxiety    Asthma    as a child, no problems as an adult, no inhaler   Cataract    NS OU   Chronic hypertension during pregnancy, antepartum 08/19/2017   Dehydration 01/28/2018   Depression    Depression during pregnancy, antepartum 07/07/2017    6/20: Short trial of zoloft previously, reports didn't help much but also didn't give it a chance Discussed r/b/a SSRIs in pregnancy, agrees to try Zoloft again, rx sent No SI/HI/red flags   Diabetes (HCC)    TYPE I. 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   ESRD on peritoneal dialysis (HCC)    HTN (hypertension)    Hypertensive retinopathy    OU   Hypokalemia 01/22/2018   Hypomagnesemia 01/28/2018   Intractable nausea and vomiting 01/22/2018   Intrauterine growth restriction (IUGR) affecting care of mother 12/22/2017   Lung nodule seen on imaging study    Recommended follow-up 3 to 6 months (May - August 2025) for repeat chest CT.   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 A2/B GDM)  Required Referrals for A1GDM or A2GDM: [x]  Diabetes Education and Testing Supplies [x]  Nutrition Cousult  For A2/B GDM or higher classes of DM [x]  Diabetes Education and Testing Supplies [x]  Nutrition Counsult [x]  Fetal ECHO after 20 weeks  [x]  Eye exam for retina evaluation - severe retinopathy 7/19  Base   Ventricular septal defect (VSD) of fetus in singleton pregnancy, antepartum 09/30/2017   May go to newborn nursery per Dr. [place] Bing Echo prior to discharge   Past Surgical History:  Procedure Laterality Date   25 GAUGE PARS PLANA VITRECTOMY WITH 20 GAUGE MVR PORT FOR MACULAR HOLE Left 07/20/2018   Procedure: 25 GAUGE PARS PLANA VITRECTOMY LEFT EYE;  Surgeon: Rennis Chris, MD;  Location: [facility] OR;  Service: Ophthalmology;  Laterality: Left;   AV FISTULA PLACEMENT Left 08/11/2021   Procedure: INSERTION OF LEFT ARM ARTERIOVENOUS (AV) GORE-TEX GRAFT;  Surgeon: Victorino Sparrow, MD;  Location:  [facility] OR;  Service: Vascular;  Laterality: Left;  PERIPHERAL NERVE BLOCK   AV FISTULA PLACEMENT Right 08/24/2022   Procedure: RIGHT UPPER EXTREMITY ARTERIOVENOUS (AV) FISTULA CREATION;  Surgeon: Chuck Hint, MD;  Location: [facility] OR;  Service: Vascular;  Laterality: Right;   AVGG REMOVAL Left 06/22/2022   Procedure: REMOVAL OF ARTERIOVENOUS GORETEX GRAFT (AVGG) LEFT ARM WITH VEIN PATCH OF BRACHIAL ARTERY;  Surgeon: Chuck Hint, MD;  Location: [facility] OR;  Service: Vascular;  Laterality: Left;   BASCILIC VEIN TRANSPOSITION Right 11/29/2022   Procedure: RIGHT ARM BASILIC VEIN TRANSPOSITION;  Surgeon: Victorino Sparrow, MD;  Location: [facility] OR;  Service: Vascular;  Laterality: Right;   CAPD INSERTION N/A 09/10/2020   Procedure: LAPAROSCOPIC INSERTION CONTINUOUS AMBULATORY PERITONEAL DIALYSIS  (CAPD) CATHETER;  Surgeon: Nada Libman, MD;  Location: [facility] OR;  Service: Vascular;  Laterality: N/A;   CESAREAN SECTION N/A 12/26/2017  Procedure: CESAREAN SECTION;  Surgeon: Tereso Newcomer, MD;  Location: [facility];  Service: Obstetrics;  Laterality: N/A;   EYE EXAMINATION UNDER ANESTHESIA Right 07/20/2018   Procedure: Eye Exam Under Anesthesia RIGHT EYE;  Surgeon: Rennis Chris, MD;  Location: [facility] OR;  Service: Ophthalmology;  Laterality: Right;   EYE SURGERY Left 07/2018   GAS INSERTION Left 07/19/2019   Procedure: INSERTION OF GAS;  Surgeon: Rennis Chris, MD;  Location: [facility] OR;  Service: Ophthalmology;  Laterality: Left;  SF6   INJECTION OF SILICONE OIL Left 07/20/2018   Procedure: Injection Of Silicone Oil LEFT EYE;  Surgeon: Rennis Chris, MD;  Location: [facility] OR;  Service: Ophthalmology;  Laterality: Left;   INSERTION OF DIALYSIS CATHETER Right 07/05/2021   Procedure: REMOVAL OF DIALYSIS CATHETER;  Surgeon: Leonie Douglas, MD;  Location: [facility] OR;  Service: Vascular;  Laterality: Right;   INSERTION OF DIALYSIS CATHETER Right 07/09/2021   Procedure: INSERTION OF TUNNELED DIALYSIS CATHETER;  Surgeon:  Leonie Douglas, MD;  Location: [facility] OR;  Service: Vascular;  Laterality: Right;   INSERTION OF DIALYSIS CATHETER Left 07/09/2021   Procedure: INSERTION OF TUNNELED  DIALYSIS CATHETER;  Surgeon: Leonie Douglas, MD;  Location: [facility] OR;  Service: Vascular;  Laterality: Left;   INSERTION OF DIALYSIS CATHETER N/A 06/22/2022   Procedure: INSERTION OF LEFT INTERNAL JUGULAR TRIALYSIS DIALYSIS CATHETER;  Surgeon: Chuck Hint, MD;  Location: [facility] OR;  Service: Vascular;  Laterality: N/A;   INSERTION OF DIALYSIS CATHETER Left 06/28/2022   Procedure: ULTRASOUND GUIDED INSERTION OF TUNNELED DIALYSIS CATHETER;  Surgeon: Leonie Douglas, MD;  Location: [facility] OR;  Service: Vascular;  Laterality: Left;   INSERTION OF DIALYSIS CATHETER Left 11/29/2022   Procedure: INSERTION OF TUNNELED DIALYSIS CATHETER TO LEFT FEMORAL VEIN;  Surgeon: Victorino Sparrow, MD;  Location: [facility] OR;  Service: Vascular;  Laterality: Left;   IR FLUORO GUIDE CV LINE RIGHT  06/02/2020   IR US GUIDE VASC ACCESS RIGHT  06/02/2020   LASER PHOTO ABLATION Right 07/20/2018   Procedure: Laser Photo Ablation RIGHT EYE;  Surgeon: Rennis Chris, MD;  Location: [facility] OR;  Service: Ophthalmology;  Laterality: Right;   MEMBRANE PEEL Left 07/20/2018   Procedure: Membrane Peel LEFT EYE;  Surgeon: Rennis Chris, MD;  Location: [facility] OR;  Service: Ophthalmology;  Laterality: Left;   MEMBRANE PEEL Left 07/19/2019   Procedure: MEMBRANE PEEL;  Surgeon: Rennis Chris, MD;  Location: [facility] OR;  Service: Ophthalmology;  Laterality: Left;   MITOMYCIN C APPLICATION Bilateral 07/20/2018   Procedure: Avastin Application;  Surgeon: Rennis Chris, MD;  Location: [facility] OR;  Service: Ophthalmology;  Laterality: Bilateral;   PHOTOCOAGULATION WITH LASER Left 07/20/2018   Procedure: Photocoagulation With Laser LEFT EYE;  Surgeon: Rennis Chris, MD;  Location: [facility] OR;  Service: Ophthalmology;  Laterality: Left;   PHOTOCOAGULATION WITH LASER Left 07/19/2019   Procedure: PHOTOCOAGULATION WITH LASER;   Surgeon: Rennis Chris, MD;  Location: [facility] OR;  Service: Ophthalmology;  Laterality: Left;   RETINAL DETACHMENT SURGERY Left 07/20/2018   Dr. Rennis Chris   SILICON OIL REMOVAL Left 07/19/2019   Procedure: 25g PARS PLANA VITRECTOMY WITH SILICON OIL REMOVAL;  Surgeon: Rennis Chris, MD;  Location: [facility] OR;  Service: Ophthalmology;  Laterality: Left;   TEE WITHOUT CARDIOVERSION N/A 07/08/2021   Procedure: TRANSESOPHAGEAL ECHOCARDIOGRAM (TEE);  Surgeon: Little Ishikawa, MD;  Location: [facility] ENDOSCOPY;  Service: Cardiovascular;  Laterality: N/A;   TEE WITHOUT CARDIOVERSION N/A 06/29/2022   Procedure: TRANSESOPHAGEAL ECHOCARDIOGRAM;  Surgeon: Eden Emms,  Noralyn Pick, MD;  Location: [facility] INVASIVE CV LAB;  Service: Cardiovascular;  Laterality: N/A;   WISDOM TOOTH EXTRACTION     FAMILY HISTORY Family History  Problem Relation Age of Onset   Diabetes Mother    Aneurysm Mother 47   Seizures Mother    Diabetes Father    Cataracts Father    COPD Father    Heart attack Father    Heart disease Father    Healthy Sister    Healthy Daughter    Stroke Maternal Grandfather    Amblyopia Neg Hx    Blindness Neg Hx    Glaucoma Neg Hx    Macular degeneration Neg Hx    Retinal detachment Neg Hx    Strabismus Neg Hx    Retinitis pigmentosa Neg Hx    Colon cancer Neg Hx    Stomach cancer Neg Hx    Esophageal cancer Neg Hx    Pancreatic cancer Neg Hx    Liver disease Neg Hx    SOCIAL HISTORY Social History   Tobacco Use   Smoking status: Never    Passive exposure: Never   Smokeless tobacco: Never  Vaping Use   Vaping status: Never Used  Substance Use Topics   Alcohol use: Not Currently   Drug use: Never       OPHTHALMIC EXAM:  Base Eye Exam     Visual Acuity (Snellen - Linear)       Right Left   Dist cc 20/40 20/200   Dist ph cc 20/25 NI    Correction: Glasses         Tonometry (Tonopen, 7:44 AM)       Right Left   Pressure 23 24         Pupils       Dark Light Shape React APD    Right 3 2 Round Brisk None   Left 3 2 Irregular Minimal None         Visual Fields       Left Right     Full   Restrictions Total superior temporal, superior nasal, inferior nasal deficiencies; Partial inner inferior temporal deficiency          Extraocular Movement       Right Left    Full, Ortho Full, Ortho         Neuro/Psych     Oriented x3: Yes   Mood/Affect: Normal         Dilation     Both eyes: 1.0% Mydriacyl, 2.5% Phenylephrine @ 7:44 AM           Slit Lamp and Fundus Exam     Slit Lamp Exam       Right Left   Lids/Lashes Normal Meibomian gland dysfunction   Conjunctiva/Sclera mild melanosis mild melanosis   Cornea Clear Well healed cataract wounds, tear film debris   Anterior Chamber Deep and clear Deep and clear   Iris Round and dilated, No NVI Round and dilated, No NVI   Lens Trace Posterior subcapsular cataract PC IOL in good position with open PC   Anterior Vitreous Vitreous syneresis, diffuse RBC -- vastly improved, VH clearing centrally and settling inferiorly post vitrectomy, clear         Fundus Exam       Right Left   Disc mild Pallor, Sharp rim 2-3+ Pallor, thin inferior rim, cupping, severe vascular attenuation   C/D Ratio 0.3 0.8   Macula Flat, good foveal reflex, scattered MA/IRH/exudate -  improved, +ERM flat, Blunted foveal reflex, ERM gone, rare Microaneurysms, mild nasal thickening   Vessels attenuated, Tortuous sclerotic arterioles, severe mild attenuation, Tortuous, sheathing along IT arcades   Periphery Attached, scattered IRH, 360 PRP laser Attached, peripapillary fibrosis regressing, 360 PRP laser changes           Refraction     Wearing Rx       Sphere Cylinder Axis Add   Right -1.00 Sphere 091 +0.00   Left +0.00 +0.75  +0.00            IMAGING AND PROCEDURES  Imaging and Procedures for @TODAY @  OCT, Retina - OU - Both Eyes       Right Eye Quality was borderline. Central Foveal Thickness:  209. Progression has been stable. Findings include normal foveal contour, no IRF, no SRF, intraretinal hyper-reflective material, epiretinal membrane, macular pucker, preretinal fibrosis.   Left Eye Quality was good. Central Foveal Thickness: 212. Progression has been stable. Findings include no SRF, abnormal foveal contour, epiretinal membrane, intraretinal fluid, outer retinal atrophy (Persistent nasal retinal thickening; stable improvement in ERM; persistent central ORA/SRHM).   Notes *Images captured and stored on drive  Diagnosis / Impression:  PDR OU OD: no DME OS: persistent nasal retinal thickening; stable improvement in ERM; persistent central ORA/SRHM  Clinical management:  See below Abbreviations: NFP - Normal foveal profile. CME - cystoid macular edema. PED - pigment epithelial detachment. IRF - intraretinal fluid. SRF - subretinal fluid. EZ - ellipsoid zone. ERM - epiretinal membrane. ORA - outer retinal atrophy. ORT - outer retinal tubulation. SRHM - subretinal hyper-reflective material             ASSESSMENT/PLAN:    ICD-10-CM   1. 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 Encounter for long-term (current) use of insulin (HCC)  Z79.4     3. Traction detachment of left retina  H33.42     4. Vitreous hemorrhage of both eyes (HCC)  H43.13     5. Epiretinal membrane (ERM) of left eye  H35.372     6. Essential hypertension  I10     7. Hypertensive retinopathy of both eyes  H35.033     8. Combined forms of age-related cataract of right eye  H25.811     9. Pseudophakia  Z96.1      1-3. 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 (OS > OD)  - A1c: 7.0 on 02.21.25  **lost to f/u from 02.08.22 to 01.22.25 (almost 3 years)**  - most recent A1C 6.2 (09.05.24)  - s/p PRP OS (11.26.19)  - s/p PRP OD (06.01.20)  - lost to f/u following 11.26.19 appt due to  complicated pregnancy, premature birth of baby w/ extended NICU stay and congenital heart defect, and post-partum health issues OS  - history of vision loss OS onset December 2019  - OS with chronic VH and TRD, likely present since Dec 2019  - s/p IVA OS #1 (06.23.20), #2 (04.02.21)  - s/p PPV/MP/EL/silicon oil + IVA OS for VH and TRD from DM1, 07.02.20             - had cataract surgery OS with Dr. Sherrine Maples, 12.15.20 -- looks good, +PCO  - s/p PPV/SOR removal/Tissue Blue stain/MP/20% SF6 gas, IVA OS, 07.01.21   OD  - today, VH stably improved  - s/p fill in PRP and IVA OD #  1 07.02.2020 in operating room  - diffuse VH OD ~11.26.20   - s/p IVA OD #2 (11.30.20), #3 (09.24.21)  - BCVA OD 20/25  - f/u 6-9 months  4,5. Epiretinal membrane, left eye --  - focal ERM with retinal thickening nasal macula   - s/p TissueBlue assisted membrane peel OS as above (7.1.21)  - OCT today shows improved nasal thickening post-ERM peel   6,7. Hypertensive retinopathy OU - there was concern for pregnancy related hypertension (I.e. Pre-eclampsia, Eclampsia, HELLP, etc) during pregnancy - also during pregnancy (7.17.2019), had bilateral CRVO w/ CME (OD > OS) s/p PRP OS  - discussed importance of tight BP control  - monitor  8. Mixed cataract OD - The symptoms of cataract, surgical options, and treatments and risks were discussed with patient.  - not visually significant    9. Pseudophakia OS  - s/p CE/IOL OS w/ Dr. Sherrine Maples, 12.15.20  - beautiful surgery  - s/p YAG cap OS (01.22.25) -- good PC opening  - BCVA remains 20/20 -- limited by retinal atrophy  Ophthalmic Meds Ordered this visit:  No orders of the defined types were placed in this encounter.     This document serves as a record of services personally performed by Karie Chimera, MD, PhD. It was created on their behalf by Glee Arvin. Manson Passey, OA an ophthalmic technician. The creation of this record is the provider's dictation and/or activities  during the visit.    Electronically signed by: Glee Arvin. Manson Passey, OA 03/15/23 11:08 PM  Karie Chimera, M.D., Ph.D. Diseases & Surgery of the Retina and Vitreous [facility] 03/15/2023   I have reviewed the above documentation for accuracy and completeness, and I agree with the above. Karie Chimera, M.D., Ph.D. 03/15/23 11:11 PM   Abbreviations: M myopia (nearsighted); A astigmatism; H hyperopia (farsighted); P presbyopia; Mrx spectacle prescription;  CTL contact lenses; OD right eye; OS left eye; OU both eyes  XT exotropia; ET esotropia; PEK punctate epithelial keratitis; PEE punctate epithelial erosions; DES dry eye syndrome; MGD meibomian gland dysfunction; ATs artificial tears; PFAT's preservative free artificial tears; NSC nuclear sclerotic cataract; PSC posterior subcapsular cataract; ERM epi-retinal membrane; PVD posterior vitreous detachment; RD retinal detachment; DM diabetes mellitus; DR diabetic retinopathy; NPDR non-proliferative diabetic retinopathy; PDR proliferative diabetic retinopathy; CSME clinically significant macular edema; DME diabetic macular edema; dbh dot blot hemorrhages; CWS cotton wool spot; POAG primary open angle glaucoma; C/D cup-to-disc ratio; HVF humphrey visual field; GVF goldmann visual field; OCT optical coherence tomography; IOP intraocular pressure; BRVO Branch retinal vein occlusion; CRVO central retinal vein occlusion; CRAO central retinal artery occlusion; BRAO branch retinal artery occlusion; RT retinal tear; SB scleral buckle; PPV pars plana vitrectomy; VH Vitreous hemorrhage; PRP panretinal laser photocoagulation; IVK intravitreal kenalog; VMT vitreomacular traction; MH Macular hole;  NVD neovascularization of the disc; NVE neovascularization elsewhere; AREDS age related eye disease study; ARMD age related macular degeneration; POAG primary open angle glaucoma; EBMD epithelial/anterior basement membrane dystrophy; ACIOL anterior chamber  intraocular lens; IOL intraocular lens; PCIOL posterior chamber intraocular lens; Phaco/IOL phacoemulsification with intraocular lens placement; PRK photorefractive keratectomy; LASIK laser assisted in situ keratomileusis; HTN hypertension; DM diabetes mellitus; COPD chronic obstructive pulmonary disease

## 2023-03-16 ENCOUNTER — Ambulatory Visit: Payer: Medicare (Managed Care)

## 2023-03-20 DIAGNOSIS — D509 Iron deficiency anemia, unspecified: Secondary | ICD-10-CM | POA: Diagnosis not present

## 2023-03-20 DIAGNOSIS — N186 End stage renal disease: Secondary | ICD-10-CM | POA: Diagnosis not present

## 2023-03-20 DIAGNOSIS — Z992 Dependence on renal dialysis: Secondary | ICD-10-CM | POA: Diagnosis not present

## 2023-03-20 DIAGNOSIS — N2581 Secondary hyperparathyroidism of renal origin: Secondary | ICD-10-CM | POA: Diagnosis not present

## 2023-03-20 DIAGNOSIS — Z4932 Encounter for adequacy testing for peritoneal dialysis: Secondary | ICD-10-CM | POA: Diagnosis not present

## 2023-03-20 DIAGNOSIS — Z4931 Encounter for adequacy testing for hemodialysis: Secondary | ICD-10-CM | POA: Diagnosis not present

## 2023-03-20 DIAGNOSIS — E440 Moderate protein-calorie malnutrition: Secondary | ICD-10-CM | POA: Diagnosis not present

## 2023-03-20 DIAGNOSIS — D630 Anemia in neoplastic disease: Secondary | ICD-10-CM | POA: Diagnosis not present

## 2023-03-20 DIAGNOSIS — E878 Other disorders of electrolyte and fluid balance, not elsewhere classified: Secondary | ICD-10-CM | POA: Diagnosis not present

## 2023-03-20 DIAGNOSIS — D631 Anemia in chronic kidney disease: Secondary | ICD-10-CM | POA: Diagnosis not present

## 2023-03-20 DIAGNOSIS — K7689 Other specified diseases of liver: Secondary | ICD-10-CM | POA: Diagnosis not present

## 2023-03-20 DIAGNOSIS — E8770 Fluid overload, unspecified: Secondary | ICD-10-CM | POA: Diagnosis not present

## 2023-03-21 ENCOUNTER — Ambulatory Visit: Payer: Medicare (Managed Care) | Admitting: Physical Therapy

## 2023-03-21 DIAGNOSIS — N186 End stage renal disease: Secondary | ICD-10-CM | POA: Diagnosis not present

## 2023-03-21 DIAGNOSIS — E878 Other disorders of electrolyte and fluid balance, not elsewhere classified: Secondary | ICD-10-CM | POA: Diagnosis not present

## 2023-03-21 DIAGNOSIS — D509 Iron deficiency anemia, unspecified: Secondary | ICD-10-CM | POA: Diagnosis not present

## 2023-03-21 DIAGNOSIS — D631 Anemia in chronic kidney disease: Secondary | ICD-10-CM | POA: Diagnosis not present

## 2023-03-21 DIAGNOSIS — Z4931 Encounter for adequacy testing for hemodialysis: Secondary | ICD-10-CM | POA: Diagnosis not present

## 2023-03-21 DIAGNOSIS — Z992 Dependence on renal dialysis: Secondary | ICD-10-CM | POA: Diagnosis not present

## 2023-03-21 DIAGNOSIS — K7689 Other specified diseases of liver: Secondary | ICD-10-CM | POA: Diagnosis not present

## 2023-03-21 DIAGNOSIS — E440 Moderate protein-calorie malnutrition: Secondary | ICD-10-CM | POA: Diagnosis not present

## 2023-03-21 DIAGNOSIS — D630 Anemia in neoplastic disease: Secondary | ICD-10-CM | POA: Diagnosis not present

## 2023-03-21 DIAGNOSIS — Z4932 Encounter for adequacy testing for peritoneal dialysis: Secondary | ICD-10-CM | POA: Diagnosis not present

## 2023-03-21 DIAGNOSIS — N2581 Secondary hyperparathyroidism of renal origin: Secondary | ICD-10-CM | POA: Diagnosis not present

## 2023-03-21 DIAGNOSIS — E8770 Fluid overload, unspecified: Secondary | ICD-10-CM | POA: Diagnosis not present

## 2023-03-23 DIAGNOSIS — D631 Anemia in chronic kidney disease: Secondary | ICD-10-CM | POA: Diagnosis not present

## 2023-03-23 DIAGNOSIS — Z4931 Encounter for adequacy testing for hemodialysis: Secondary | ICD-10-CM | POA: Diagnosis not present

## 2023-03-23 DIAGNOSIS — E8770 Fluid overload, unspecified: Secondary | ICD-10-CM | POA: Diagnosis not present

## 2023-03-23 DIAGNOSIS — Z992 Dependence on renal dialysis: Secondary | ICD-10-CM | POA: Diagnosis not present

## 2023-03-23 DIAGNOSIS — D509 Iron deficiency anemia, unspecified: Secondary | ICD-10-CM | POA: Diagnosis not present

## 2023-03-23 DIAGNOSIS — E440 Moderate protein-calorie malnutrition: Secondary | ICD-10-CM | POA: Diagnosis not present

## 2023-03-23 DIAGNOSIS — K7689 Other specified diseases of liver: Secondary | ICD-10-CM | POA: Diagnosis not present

## 2023-03-23 DIAGNOSIS — N2581 Secondary hyperparathyroidism of renal origin: Secondary | ICD-10-CM | POA: Diagnosis not present

## 2023-03-23 DIAGNOSIS — N186 End stage renal disease: Secondary | ICD-10-CM | POA: Diagnosis not present

## 2023-03-23 DIAGNOSIS — Z4932 Encounter for adequacy testing for peritoneal dialysis: Secondary | ICD-10-CM | POA: Diagnosis not present

## 2023-03-23 DIAGNOSIS — E878 Other disorders of electrolyte and fluid balance, not elsewhere classified: Secondary | ICD-10-CM | POA: Diagnosis not present

## 2023-03-23 DIAGNOSIS — D630 Anemia in neoplastic disease: Secondary | ICD-10-CM | POA: Diagnosis not present

## 2023-03-24 DIAGNOSIS — E440 Moderate protein-calorie malnutrition: Secondary | ICD-10-CM | POA: Diagnosis not present

## 2023-03-24 DIAGNOSIS — N2581 Secondary hyperparathyroidism of renal origin: Secondary | ICD-10-CM | POA: Diagnosis not present

## 2023-03-24 DIAGNOSIS — K7689 Other specified diseases of liver: Secondary | ICD-10-CM | POA: Diagnosis not present

## 2023-03-24 DIAGNOSIS — N186 End stage renal disease: Secondary | ICD-10-CM | POA: Diagnosis not present

## 2023-03-24 DIAGNOSIS — Z4932 Encounter for adequacy testing for peritoneal dialysis: Secondary | ICD-10-CM | POA: Diagnosis not present

## 2023-03-24 DIAGNOSIS — D631 Anemia in chronic kidney disease: Secondary | ICD-10-CM | POA: Diagnosis not present

## 2023-03-24 DIAGNOSIS — D630 Anemia in neoplastic disease: Secondary | ICD-10-CM | POA: Diagnosis not present

## 2023-03-24 DIAGNOSIS — E878 Other disorders of electrolyte and fluid balance, not elsewhere classified: Secondary | ICD-10-CM | POA: Diagnosis not present

## 2023-03-24 DIAGNOSIS — Z992 Dependence on renal dialysis: Secondary | ICD-10-CM | POA: Diagnosis not present

## 2023-03-24 DIAGNOSIS — Z4931 Encounter for adequacy testing for hemodialysis: Secondary | ICD-10-CM | POA: Diagnosis not present

## 2023-03-24 DIAGNOSIS — E8770 Fluid overload, unspecified: Secondary | ICD-10-CM | POA: Diagnosis not present

## 2023-03-24 DIAGNOSIS — D509 Iron deficiency anemia, unspecified: Secondary | ICD-10-CM | POA: Diagnosis not present

## 2023-03-27 DIAGNOSIS — Z4931 Encounter for adequacy testing for hemodialysis: Secondary | ICD-10-CM | POA: Diagnosis not present

## 2023-03-27 DIAGNOSIS — E8770 Fluid overload, unspecified: Secondary | ICD-10-CM | POA: Diagnosis not present

## 2023-03-27 DIAGNOSIS — E878 Other disorders of electrolyte and fluid balance, not elsewhere classified: Secondary | ICD-10-CM | POA: Diagnosis not present

## 2023-03-27 DIAGNOSIS — D509 Iron deficiency anemia, unspecified: Secondary | ICD-10-CM | POA: Diagnosis not present

## 2023-03-27 DIAGNOSIS — N2581 Secondary hyperparathyroidism of renal origin: Secondary | ICD-10-CM | POA: Diagnosis not present

## 2023-03-27 DIAGNOSIS — E440 Moderate protein-calorie malnutrition: Secondary | ICD-10-CM | POA: Diagnosis not present

## 2023-03-27 DIAGNOSIS — Z992 Dependence on renal dialysis: Secondary | ICD-10-CM | POA: Diagnosis not present

## 2023-03-27 DIAGNOSIS — D631 Anemia in chronic kidney disease: Secondary | ICD-10-CM | POA: Diagnosis not present

## 2023-03-27 DIAGNOSIS — K7689 Other specified diseases of liver: Secondary | ICD-10-CM | POA: Diagnosis not present

## 2023-03-27 DIAGNOSIS — Z4932 Encounter for adequacy testing for peritoneal dialysis: Secondary | ICD-10-CM | POA: Diagnosis not present

## 2023-03-27 DIAGNOSIS — D630 Anemia in neoplastic disease: Secondary | ICD-10-CM | POA: Diagnosis not present

## 2023-03-27 DIAGNOSIS — N186 End stage renal disease: Secondary | ICD-10-CM | POA: Diagnosis not present

## 2023-03-28 DIAGNOSIS — Z4931 Encounter for adequacy testing for hemodialysis: Secondary | ICD-10-CM | POA: Diagnosis not present

## 2023-03-28 DIAGNOSIS — Z4932 Encounter for adequacy testing for peritoneal dialysis: Secondary | ICD-10-CM | POA: Diagnosis not present

## 2023-03-28 DIAGNOSIS — Z992 Dependence on renal dialysis: Secondary | ICD-10-CM | POA: Diagnosis not present

## 2023-03-28 DIAGNOSIS — E440 Moderate protein-calorie malnutrition: Secondary | ICD-10-CM | POA: Diagnosis not present

## 2023-03-28 DIAGNOSIS — D509 Iron deficiency anemia, unspecified: Secondary | ICD-10-CM | POA: Diagnosis not present

## 2023-03-28 DIAGNOSIS — D630 Anemia in neoplastic disease: Secondary | ICD-10-CM | POA: Diagnosis not present

## 2023-03-28 DIAGNOSIS — N2581 Secondary hyperparathyroidism of renal origin: Secondary | ICD-10-CM | POA: Diagnosis not present

## 2023-03-28 DIAGNOSIS — E8770 Fluid overload, unspecified: Secondary | ICD-10-CM | POA: Diagnosis not present

## 2023-03-28 DIAGNOSIS — N186 End stage renal disease: Secondary | ICD-10-CM | POA: Diagnosis not present

## 2023-03-28 DIAGNOSIS — E878 Other disorders of electrolyte and fluid balance, not elsewhere classified: Secondary | ICD-10-CM | POA: Diagnosis not present

## 2023-03-28 DIAGNOSIS — K7689 Other specified diseases of liver: Secondary | ICD-10-CM | POA: Diagnosis not present

## 2023-03-28 DIAGNOSIS — D631 Anemia in chronic kidney disease: Secondary | ICD-10-CM | POA: Diagnosis not present

## 2023-03-30 DIAGNOSIS — D509 Iron deficiency anemia, unspecified: Secondary | ICD-10-CM | POA: Diagnosis not present

## 2023-03-30 DIAGNOSIS — N186 End stage renal disease: Secondary | ICD-10-CM | POA: Diagnosis not present

## 2023-03-30 DIAGNOSIS — Z4931 Encounter for adequacy testing for hemodialysis: Secondary | ICD-10-CM | POA: Diagnosis not present

## 2023-03-30 DIAGNOSIS — E878 Other disorders of electrolyte and fluid balance, not elsewhere classified: Secondary | ICD-10-CM | POA: Diagnosis not present

## 2023-03-30 DIAGNOSIS — Z4932 Encounter for adequacy testing for peritoneal dialysis: Secondary | ICD-10-CM | POA: Diagnosis not present

## 2023-03-30 DIAGNOSIS — K7689 Other specified diseases of liver: Secondary | ICD-10-CM | POA: Diagnosis not present

## 2023-03-30 DIAGNOSIS — Z992 Dependence on renal dialysis: Secondary | ICD-10-CM | POA: Diagnosis not present

## 2023-03-30 DIAGNOSIS — D630 Anemia in neoplastic disease: Secondary | ICD-10-CM | POA: Diagnosis not present

## 2023-03-30 DIAGNOSIS — E8770 Fluid overload, unspecified: Secondary | ICD-10-CM | POA: Diagnosis not present

## 2023-03-30 DIAGNOSIS — D631 Anemia in chronic kidney disease: Secondary | ICD-10-CM | POA: Diagnosis not present

## 2023-03-30 DIAGNOSIS — E440 Moderate protein-calorie malnutrition: Secondary | ICD-10-CM | POA: Diagnosis not present

## 2023-03-30 DIAGNOSIS — N2581 Secondary hyperparathyroidism of renal origin: Secondary | ICD-10-CM | POA: Diagnosis not present

## 2023-04-01 DIAGNOSIS — N186 End stage renal disease: Secondary | ICD-10-CM | POA: Diagnosis not present

## 2023-04-01 DIAGNOSIS — K7689 Other specified diseases of liver: Secondary | ICD-10-CM | POA: Diagnosis not present

## 2023-04-01 DIAGNOSIS — E8770 Fluid overload, unspecified: Secondary | ICD-10-CM | POA: Diagnosis not present

## 2023-04-01 DIAGNOSIS — Z4932 Encounter for adequacy testing for peritoneal dialysis: Secondary | ICD-10-CM | POA: Diagnosis not present

## 2023-04-01 DIAGNOSIS — E878 Other disorders of electrolyte and fluid balance, not elsewhere classified: Secondary | ICD-10-CM | POA: Diagnosis not present

## 2023-04-01 DIAGNOSIS — Z4931 Encounter for adequacy testing for hemodialysis: Secondary | ICD-10-CM | POA: Diagnosis not present

## 2023-04-01 DIAGNOSIS — N2581 Secondary hyperparathyroidism of renal origin: Secondary | ICD-10-CM | POA: Diagnosis not present

## 2023-04-01 DIAGNOSIS — E440 Moderate protein-calorie malnutrition: Secondary | ICD-10-CM | POA: Diagnosis not present

## 2023-04-01 DIAGNOSIS — D630 Anemia in neoplastic disease: Secondary | ICD-10-CM | POA: Diagnosis not present

## 2023-04-01 DIAGNOSIS — D631 Anemia in chronic kidney disease: Secondary | ICD-10-CM | POA: Diagnosis not present

## 2023-04-01 DIAGNOSIS — D509 Iron deficiency anemia, unspecified: Secondary | ICD-10-CM | POA: Diagnosis not present

## 2023-04-01 DIAGNOSIS — Z992 Dependence on renal dialysis: Secondary | ICD-10-CM | POA: Diagnosis not present

## 2023-04-03 DIAGNOSIS — Z4932 Encounter for adequacy testing for peritoneal dialysis: Secondary | ICD-10-CM | POA: Diagnosis not present

## 2023-04-03 DIAGNOSIS — E440 Moderate protein-calorie malnutrition: Secondary | ICD-10-CM | POA: Diagnosis not present

## 2023-04-03 DIAGNOSIS — Z992 Dependence on renal dialysis: Secondary | ICD-10-CM | POA: Diagnosis not present

## 2023-04-03 DIAGNOSIS — E8770 Fluid overload, unspecified: Secondary | ICD-10-CM | POA: Diagnosis not present

## 2023-04-03 DIAGNOSIS — D630 Anemia in neoplastic disease: Secondary | ICD-10-CM | POA: Diagnosis not present

## 2023-04-03 DIAGNOSIS — D509 Iron deficiency anemia, unspecified: Secondary | ICD-10-CM | POA: Diagnosis not present

## 2023-04-03 DIAGNOSIS — Z4931 Encounter for adequacy testing for hemodialysis: Secondary | ICD-10-CM | POA: Diagnosis not present

## 2023-04-03 DIAGNOSIS — K7689 Other specified diseases of liver: Secondary | ICD-10-CM | POA: Diagnosis not present

## 2023-04-03 DIAGNOSIS — N2581 Secondary hyperparathyroidism of renal origin: Secondary | ICD-10-CM | POA: Diagnosis not present

## 2023-04-03 DIAGNOSIS — E878 Other disorders of electrolyte and fluid balance, not elsewhere classified: Secondary | ICD-10-CM | POA: Diagnosis not present

## 2023-04-03 DIAGNOSIS — D631 Anemia in chronic kidney disease: Secondary | ICD-10-CM | POA: Diagnosis not present

## 2023-04-03 DIAGNOSIS — N186 End stage renal disease: Secondary | ICD-10-CM | POA: Diagnosis not present

## 2023-04-05 DIAGNOSIS — N186 End stage renal disease: Secondary | ICD-10-CM | POA: Diagnosis not present

## 2023-04-05 DIAGNOSIS — E8770 Fluid overload, unspecified: Secondary | ICD-10-CM | POA: Diagnosis not present

## 2023-04-05 DIAGNOSIS — E440 Moderate protein-calorie malnutrition: Secondary | ICD-10-CM | POA: Diagnosis not present

## 2023-04-05 DIAGNOSIS — D631 Anemia in chronic kidney disease: Secondary | ICD-10-CM | POA: Diagnosis not present

## 2023-04-05 DIAGNOSIS — Z4932 Encounter for adequacy testing for peritoneal dialysis: Secondary | ICD-10-CM | POA: Diagnosis not present

## 2023-04-05 DIAGNOSIS — Z992 Dependence on renal dialysis: Secondary | ICD-10-CM | POA: Diagnosis not present

## 2023-04-05 DIAGNOSIS — E878 Other disorders of electrolyte and fluid balance, not elsewhere classified: Secondary | ICD-10-CM | POA: Diagnosis not present

## 2023-04-05 DIAGNOSIS — N2581 Secondary hyperparathyroidism of renal origin: Secondary | ICD-10-CM | POA: Diagnosis not present

## 2023-04-05 DIAGNOSIS — D630 Anemia in neoplastic disease: Secondary | ICD-10-CM | POA: Diagnosis not present

## 2023-04-05 DIAGNOSIS — K7689 Other specified diseases of liver: Secondary | ICD-10-CM | POA: Diagnosis not present

## 2023-04-05 DIAGNOSIS — Z4931 Encounter for adequacy testing for hemodialysis: Secondary | ICD-10-CM | POA: Diagnosis not present

## 2023-04-05 DIAGNOSIS — D509 Iron deficiency anemia, unspecified: Secondary | ICD-10-CM | POA: Diagnosis not present

## 2023-04-06 ENCOUNTER — Telehealth: Payer: Self-pay | Admitting: Student

## 2023-04-06 NOTE — Telephone Encounter (Signed)
 Copied from CRM [id number]. Topic: Medical Record Request - Records Request >> Apr 06, 2023  4:17 PM Everette Rank wrote: Reason for CRM: Patient requesting PAP Smear records from office to mailed to home. Called office then transferred.  Please see previous messages.

## 2023-04-06 NOTE — Telephone Encounter (Signed)
 Copied from CRM [id number]. Topic: General - Other >> Apr 06, 2023 12:24 PM Corin V wrote: Reason for CRM: Patient is wanting a copy of her most recent pap smear mailed to her at: [address] [place] [employer] [id number]   [facility] for the pt to call back.  Per the Triage Nurse Angelina Ok the patient will need to sign a Medical Release form before her Results can be mailed to her.

## 2023-04-06 NOTE — Telephone Encounter (Signed)
 Barbara Donovan spoke with the patient with me present- patient will be coming tomorrow to sign a record release.

## 2023-04-07 DIAGNOSIS — E878 Other disorders of electrolyte and fluid balance, not elsewhere classified: Secondary | ICD-10-CM | POA: Diagnosis not present

## 2023-04-07 DIAGNOSIS — D630 Anemia in neoplastic disease: Secondary | ICD-10-CM | POA: Diagnosis not present

## 2023-04-07 DIAGNOSIS — E8770 Fluid overload, unspecified: Secondary | ICD-10-CM | POA: Diagnosis not present

## 2023-04-07 DIAGNOSIS — Z4932 Encounter for adequacy testing for peritoneal dialysis: Secondary | ICD-10-CM | POA: Diagnosis not present

## 2023-04-07 DIAGNOSIS — D631 Anemia in chronic kidney disease: Secondary | ICD-10-CM | POA: Diagnosis not present

## 2023-04-07 DIAGNOSIS — E440 Moderate protein-calorie malnutrition: Secondary | ICD-10-CM | POA: Diagnosis not present

## 2023-04-07 DIAGNOSIS — Z4931 Encounter for adequacy testing for hemodialysis: Secondary | ICD-10-CM | POA: Diagnosis not present

## 2023-04-07 DIAGNOSIS — Z992 Dependence on renal dialysis: Secondary | ICD-10-CM | POA: Diagnosis not present

## 2023-04-07 DIAGNOSIS — D509 Iron deficiency anemia, unspecified: Secondary | ICD-10-CM | POA: Diagnosis not present

## 2023-04-07 DIAGNOSIS — N186 End stage renal disease: Secondary | ICD-10-CM | POA: Diagnosis not present

## 2023-04-07 DIAGNOSIS — N2581 Secondary hyperparathyroidism of renal origin: Secondary | ICD-10-CM | POA: Diagnosis not present

## 2023-04-07 DIAGNOSIS — K7689 Other specified diseases of liver: Secondary | ICD-10-CM | POA: Diagnosis not present

## 2023-04-09 DIAGNOSIS — D630 Anemia in neoplastic disease: Secondary | ICD-10-CM | POA: Diagnosis not present

## 2023-04-09 DIAGNOSIS — E440 Moderate protein-calorie malnutrition: Secondary | ICD-10-CM | POA: Diagnosis not present

## 2023-04-09 DIAGNOSIS — Z4931 Encounter for adequacy testing for hemodialysis: Secondary | ICD-10-CM | POA: Diagnosis not present

## 2023-04-09 DIAGNOSIS — Z4932 Encounter for adequacy testing for peritoneal dialysis: Secondary | ICD-10-CM | POA: Diagnosis not present

## 2023-04-09 DIAGNOSIS — D509 Iron deficiency anemia, unspecified: Secondary | ICD-10-CM | POA: Diagnosis not present

## 2023-04-09 DIAGNOSIS — Z992 Dependence on renal dialysis: Secondary | ICD-10-CM | POA: Diagnosis not present

## 2023-04-09 DIAGNOSIS — E8770 Fluid overload, unspecified: Secondary | ICD-10-CM | POA: Diagnosis not present

## 2023-04-09 DIAGNOSIS — N2581 Secondary hyperparathyroidism of renal origin: Secondary | ICD-10-CM | POA: Diagnosis not present

## 2023-04-09 DIAGNOSIS — N186 End stage renal disease: Secondary | ICD-10-CM | POA: Diagnosis not present

## 2023-04-09 DIAGNOSIS — E878 Other disorders of electrolyte and fluid balance, not elsewhere classified: Secondary | ICD-10-CM | POA: Diagnosis not present

## 2023-04-09 DIAGNOSIS — D631 Anemia in chronic kidney disease: Secondary | ICD-10-CM | POA: Diagnosis not present

## 2023-04-09 DIAGNOSIS — K7689 Other specified diseases of liver: Secondary | ICD-10-CM | POA: Diagnosis not present

## 2023-04-12 DIAGNOSIS — N186 End stage renal disease: Secondary | ICD-10-CM | POA: Diagnosis not present

## 2023-04-12 DIAGNOSIS — E8770 Fluid overload, unspecified: Secondary | ICD-10-CM | POA: Diagnosis not present

## 2023-04-12 DIAGNOSIS — K7689 Other specified diseases of liver: Secondary | ICD-10-CM | POA: Diagnosis not present

## 2023-04-12 DIAGNOSIS — D509 Iron deficiency anemia, unspecified: Secondary | ICD-10-CM | POA: Diagnosis not present

## 2023-04-12 DIAGNOSIS — D630 Anemia in neoplastic disease: Secondary | ICD-10-CM | POA: Diagnosis not present

## 2023-04-12 DIAGNOSIS — D631 Anemia in chronic kidney disease: Secondary | ICD-10-CM | POA: Diagnosis not present

## 2023-04-12 DIAGNOSIS — E440 Moderate protein-calorie malnutrition: Secondary | ICD-10-CM | POA: Diagnosis not present

## 2023-04-12 DIAGNOSIS — N2581 Secondary hyperparathyroidism of renal origin: Secondary | ICD-10-CM | POA: Diagnosis not present

## 2023-04-12 DIAGNOSIS — Z4931 Encounter for adequacy testing for hemodialysis: Secondary | ICD-10-CM | POA: Diagnosis not present

## 2023-04-12 DIAGNOSIS — Z992 Dependence on renal dialysis: Secondary | ICD-10-CM | POA: Diagnosis not present

## 2023-04-12 DIAGNOSIS — Z4932 Encounter for adequacy testing for peritoneal dialysis: Secondary | ICD-10-CM | POA: Diagnosis not present

## 2023-04-12 DIAGNOSIS — E878 Other disorders of electrolyte and fluid balance, not elsewhere classified: Secondary | ICD-10-CM | POA: Diagnosis not present

## 2023-04-14 DIAGNOSIS — N186 End stage renal disease: Secondary | ICD-10-CM | POA: Diagnosis not present

## 2023-04-14 DIAGNOSIS — D631 Anemia in chronic kidney disease: Secondary | ICD-10-CM | POA: Diagnosis not present

## 2023-04-14 DIAGNOSIS — Z992 Dependence on renal dialysis: Secondary | ICD-10-CM | POA: Diagnosis not present

## 2023-04-14 DIAGNOSIS — D630 Anemia in neoplastic disease: Secondary | ICD-10-CM | POA: Diagnosis not present

## 2023-04-14 DIAGNOSIS — E8770 Fluid overload, unspecified: Secondary | ICD-10-CM | POA: Diagnosis not present

## 2023-04-14 DIAGNOSIS — K7689 Other specified diseases of liver: Secondary | ICD-10-CM | POA: Diagnosis not present

## 2023-04-14 DIAGNOSIS — E878 Other disorders of electrolyte and fluid balance, not elsewhere classified: Secondary | ICD-10-CM | POA: Diagnosis not present

## 2023-04-14 DIAGNOSIS — E440 Moderate protein-calorie malnutrition: Secondary | ICD-10-CM | POA: Diagnosis not present

## 2023-04-14 DIAGNOSIS — Z4931 Encounter for adequacy testing for hemodialysis: Secondary | ICD-10-CM | POA: Diagnosis not present

## 2023-04-14 DIAGNOSIS — D509 Iron deficiency anemia, unspecified: Secondary | ICD-10-CM | POA: Diagnosis not present

## 2023-04-14 DIAGNOSIS — N2581 Secondary hyperparathyroidism of renal origin: Secondary | ICD-10-CM | POA: Diagnosis not present

## 2023-04-14 DIAGNOSIS — Z4932 Encounter for adequacy testing for peritoneal dialysis: Secondary | ICD-10-CM | POA: Diagnosis not present

## 2023-04-15 DIAGNOSIS — Z4932 Encounter for adequacy testing for peritoneal dialysis: Secondary | ICD-10-CM | POA: Diagnosis not present

## 2023-04-15 DIAGNOSIS — Z4931 Encounter for adequacy testing for hemodialysis: Secondary | ICD-10-CM | POA: Diagnosis not present

## 2023-04-15 DIAGNOSIS — E878 Other disorders of electrolyte and fluid balance, not elsewhere classified: Secondary | ICD-10-CM | POA: Diagnosis not present

## 2023-04-15 DIAGNOSIS — D509 Iron deficiency anemia, unspecified: Secondary | ICD-10-CM | POA: Diagnosis not present

## 2023-04-15 DIAGNOSIS — Z992 Dependence on renal dialysis: Secondary | ICD-10-CM | POA: Diagnosis not present

## 2023-04-15 DIAGNOSIS — E440 Moderate protein-calorie malnutrition: Secondary | ICD-10-CM | POA: Diagnosis not present

## 2023-04-15 DIAGNOSIS — D631 Anemia in chronic kidney disease: Secondary | ICD-10-CM | POA: Diagnosis not present

## 2023-04-15 DIAGNOSIS — D630 Anemia in neoplastic disease: Secondary | ICD-10-CM | POA: Diagnosis not present

## 2023-04-15 DIAGNOSIS — E8770 Fluid overload, unspecified: Secondary | ICD-10-CM | POA: Diagnosis not present

## 2023-04-15 DIAGNOSIS — K7689 Other specified diseases of liver: Secondary | ICD-10-CM | POA: Diagnosis not present

## 2023-04-15 DIAGNOSIS — N2581 Secondary hyperparathyroidism of renal origin: Secondary | ICD-10-CM | POA: Diagnosis not present

## 2023-04-15 DIAGNOSIS — N186 End stage renal disease: Secondary | ICD-10-CM | POA: Diagnosis not present

## 2023-04-16 DIAGNOSIS — Z4931 Encounter for adequacy testing for hemodialysis: Secondary | ICD-10-CM | POA: Diagnosis not present

## 2023-04-16 DIAGNOSIS — E1065 Type 1 diabetes mellitus with hyperglycemia: Secondary | ICD-10-CM | POA: Diagnosis not present

## 2023-04-16 DIAGNOSIS — K7689 Other specified diseases of liver: Secondary | ICD-10-CM | POA: Diagnosis not present

## 2023-04-16 DIAGNOSIS — E440 Moderate protein-calorie malnutrition: Secondary | ICD-10-CM | POA: Diagnosis not present

## 2023-04-16 DIAGNOSIS — D630 Anemia in neoplastic disease: Secondary | ICD-10-CM | POA: Diagnosis not present

## 2023-04-16 DIAGNOSIS — Z4932 Encounter for adequacy testing for peritoneal dialysis: Secondary | ICD-10-CM | POA: Diagnosis not present

## 2023-04-16 DIAGNOSIS — N186 End stage renal disease: Secondary | ICD-10-CM | POA: Diagnosis not present

## 2023-04-16 DIAGNOSIS — D509 Iron deficiency anemia, unspecified: Secondary | ICD-10-CM | POA: Diagnosis not present

## 2023-04-16 DIAGNOSIS — Z992 Dependence on renal dialysis: Secondary | ICD-10-CM | POA: Diagnosis not present

## 2023-04-16 DIAGNOSIS — D631 Anemia in chronic kidney disease: Secondary | ICD-10-CM | POA: Diagnosis not present

## 2023-04-16 DIAGNOSIS — N2581 Secondary hyperparathyroidism of renal origin: Secondary | ICD-10-CM | POA: Diagnosis not present

## 2023-04-16 DIAGNOSIS — E878 Other disorders of electrolyte and fluid balance, not elsewhere classified: Secondary | ICD-10-CM | POA: Diagnosis not present

## 2023-04-16 DIAGNOSIS — E8770 Fluid overload, unspecified: Secondary | ICD-10-CM | POA: Diagnosis not present

## 2023-04-18 DIAGNOSIS — E8770 Fluid overload, unspecified: Secondary | ICD-10-CM | POA: Diagnosis not present

## 2023-04-18 DIAGNOSIS — N2581 Secondary hyperparathyroidism of renal origin: Secondary | ICD-10-CM | POA: Diagnosis not present

## 2023-04-18 DIAGNOSIS — D631 Anemia in chronic kidney disease: Secondary | ICD-10-CM | POA: Diagnosis not present

## 2023-04-18 DIAGNOSIS — Z4931 Encounter for adequacy testing for hemodialysis: Secondary | ICD-10-CM | POA: Diagnosis not present

## 2023-04-18 DIAGNOSIS — D509 Iron deficiency anemia, unspecified: Secondary | ICD-10-CM | POA: Diagnosis not present

## 2023-04-18 DIAGNOSIS — K7689 Other specified diseases of liver: Secondary | ICD-10-CM | POA: Diagnosis not present

## 2023-04-18 DIAGNOSIS — D630 Anemia in neoplastic disease: Secondary | ICD-10-CM | POA: Diagnosis not present

## 2023-04-18 DIAGNOSIS — Z992 Dependence on renal dialysis: Secondary | ICD-10-CM | POA: Diagnosis not present

## 2023-04-18 DIAGNOSIS — N186 End stage renal disease: Secondary | ICD-10-CM | POA: Diagnosis not present

## 2023-04-18 DIAGNOSIS — E1022 Type 1 diabetes mellitus with diabetic chronic kidney disease: Secondary | ICD-10-CM | POA: Diagnosis not present

## 2023-04-18 DIAGNOSIS — E878 Other disorders of electrolyte and fluid balance, not elsewhere classified: Secondary | ICD-10-CM | POA: Diagnosis not present

## 2023-04-18 DIAGNOSIS — Z4932 Encounter for adequacy testing for peritoneal dialysis: Secondary | ICD-10-CM | POA: Diagnosis not present

## 2023-04-18 DIAGNOSIS — E440 Moderate protein-calorie malnutrition: Secondary | ICD-10-CM | POA: Diagnosis not present

## 2023-04-20 DIAGNOSIS — N2581 Secondary hyperparathyroidism of renal origin: Secondary | ICD-10-CM | POA: Diagnosis not present

## 2023-04-20 DIAGNOSIS — Z4931 Encounter for adequacy testing for hemodialysis: Secondary | ICD-10-CM | POA: Diagnosis not present

## 2023-04-20 DIAGNOSIS — E1022 Type 1 diabetes mellitus with diabetic chronic kidney disease: Secondary | ICD-10-CM | POA: Diagnosis not present

## 2023-04-20 DIAGNOSIS — K7689 Other specified diseases of liver: Secondary | ICD-10-CM | POA: Diagnosis not present

## 2023-04-20 DIAGNOSIS — E440 Moderate protein-calorie malnutrition: Secondary | ICD-10-CM | POA: Diagnosis not present

## 2023-04-20 DIAGNOSIS — Z992 Dependence on renal dialysis: Secondary | ICD-10-CM | POA: Diagnosis not present

## 2023-04-20 DIAGNOSIS — D630 Anemia in neoplastic disease: Secondary | ICD-10-CM | POA: Diagnosis not present

## 2023-04-20 DIAGNOSIS — N2589 Other disorders resulting from impaired renal tubular function: Secondary | ICD-10-CM | POA: Diagnosis not present

## 2023-04-20 DIAGNOSIS — N186 End stage renal disease: Secondary | ICD-10-CM | POA: Diagnosis not present

## 2023-04-20 DIAGNOSIS — E878 Other disorders of electrolyte and fluid balance, not elsewhere classified: Secondary | ICD-10-CM | POA: Diagnosis not present

## 2023-04-20 DIAGNOSIS — D509 Iron deficiency anemia, unspecified: Secondary | ICD-10-CM | POA: Diagnosis not present

## 2023-04-20 DIAGNOSIS — E8770 Fluid overload, unspecified: Secondary | ICD-10-CM | POA: Diagnosis not present

## 2023-04-20 DIAGNOSIS — Z4932 Encounter for adequacy testing for peritoneal dialysis: Secondary | ICD-10-CM | POA: Diagnosis not present

## 2023-04-21 DIAGNOSIS — E878 Other disorders of electrolyte and fluid balance, not elsewhere classified: Secondary | ICD-10-CM | POA: Diagnosis not present

## 2023-04-21 DIAGNOSIS — D630 Anemia in neoplastic disease: Secondary | ICD-10-CM | POA: Diagnosis not present

## 2023-04-21 DIAGNOSIS — E440 Moderate protein-calorie malnutrition: Secondary | ICD-10-CM | POA: Diagnosis not present

## 2023-04-21 DIAGNOSIS — N2581 Secondary hyperparathyroidism of renal origin: Secondary | ICD-10-CM | POA: Diagnosis not present

## 2023-04-21 DIAGNOSIS — N2589 Other disorders resulting from impaired renal tubular function: Secondary | ICD-10-CM | POA: Diagnosis not present

## 2023-04-21 DIAGNOSIS — Z4931 Encounter for adequacy testing for hemodialysis: Secondary | ICD-10-CM | POA: Diagnosis not present

## 2023-04-21 DIAGNOSIS — Z992 Dependence on renal dialysis: Secondary | ICD-10-CM | POA: Diagnosis not present

## 2023-04-21 DIAGNOSIS — N186 End stage renal disease: Secondary | ICD-10-CM | POA: Diagnosis not present

## 2023-04-21 DIAGNOSIS — E8770 Fluid overload, unspecified: Secondary | ICD-10-CM | POA: Diagnosis not present

## 2023-04-21 DIAGNOSIS — Z4932 Encounter for adequacy testing for peritoneal dialysis: Secondary | ICD-10-CM | POA: Diagnosis not present

## 2023-04-21 DIAGNOSIS — D509 Iron deficiency anemia, unspecified: Secondary | ICD-10-CM | POA: Diagnosis not present

## 2023-04-21 DIAGNOSIS — K7689 Other specified diseases of liver: Secondary | ICD-10-CM | POA: Diagnosis not present

## 2023-04-21 DIAGNOSIS — E1022 Type 1 diabetes mellitus with diabetic chronic kidney disease: Secondary | ICD-10-CM | POA: Diagnosis not present

## 2023-04-23 DIAGNOSIS — N2581 Secondary hyperparathyroidism of renal origin: Secondary | ICD-10-CM | POA: Diagnosis not present

## 2023-04-23 DIAGNOSIS — Z4932 Encounter for adequacy testing for peritoneal dialysis: Secondary | ICD-10-CM | POA: Diagnosis not present

## 2023-04-23 DIAGNOSIS — E440 Moderate protein-calorie malnutrition: Secondary | ICD-10-CM | POA: Diagnosis not present

## 2023-04-23 DIAGNOSIS — Z992 Dependence on renal dialysis: Secondary | ICD-10-CM | POA: Diagnosis not present

## 2023-04-23 DIAGNOSIS — K7689 Other specified diseases of liver: Secondary | ICD-10-CM | POA: Diagnosis not present

## 2023-04-23 DIAGNOSIS — E1022 Type 1 diabetes mellitus with diabetic chronic kidney disease: Secondary | ICD-10-CM | POA: Diagnosis not present

## 2023-04-23 DIAGNOSIS — Z4931 Encounter for adequacy testing for hemodialysis: Secondary | ICD-10-CM | POA: Diagnosis not present

## 2023-04-23 DIAGNOSIS — D509 Iron deficiency anemia, unspecified: Secondary | ICD-10-CM | POA: Diagnosis not present

## 2023-04-23 DIAGNOSIS — N186 End stage renal disease: Secondary | ICD-10-CM | POA: Diagnosis not present

## 2023-04-23 DIAGNOSIS — E8770 Fluid overload, unspecified: Secondary | ICD-10-CM | POA: Diagnosis not present

## 2023-04-23 DIAGNOSIS — E878 Other disorders of electrolyte and fluid balance, not elsewhere classified: Secondary | ICD-10-CM | POA: Diagnosis not present

## 2023-04-23 DIAGNOSIS — D630 Anemia in neoplastic disease: Secondary | ICD-10-CM | POA: Diagnosis not present

## 2023-04-23 DIAGNOSIS — N2589 Other disorders resulting from impaired renal tubular function: Secondary | ICD-10-CM | POA: Diagnosis not present

## 2023-04-25 DIAGNOSIS — E1022 Type 1 diabetes mellitus with diabetic chronic kidney disease: Secondary | ICD-10-CM | POA: Diagnosis not present

## 2023-04-25 DIAGNOSIS — N189 Chronic kidney disease, unspecified: Secondary | ICD-10-CM | POA: Diagnosis not present

## 2023-04-25 DIAGNOSIS — Z01818 Encounter for other preprocedural examination: Secondary | ICD-10-CM | POA: Diagnosis not present

## 2023-04-26 DIAGNOSIS — D509 Iron deficiency anemia, unspecified: Secondary | ICD-10-CM | POA: Diagnosis not present

## 2023-04-26 DIAGNOSIS — N2581 Secondary hyperparathyroidism of renal origin: Secondary | ICD-10-CM | POA: Diagnosis not present

## 2023-04-26 DIAGNOSIS — Z4932 Encounter for adequacy testing for peritoneal dialysis: Secondary | ICD-10-CM | POA: Diagnosis not present

## 2023-04-26 DIAGNOSIS — E440 Moderate protein-calorie malnutrition: Secondary | ICD-10-CM | POA: Diagnosis not present

## 2023-04-26 DIAGNOSIS — E8770 Fluid overload, unspecified: Secondary | ICD-10-CM | POA: Diagnosis not present

## 2023-04-26 DIAGNOSIS — K7689 Other specified diseases of liver: Secondary | ICD-10-CM | POA: Diagnosis not present

## 2023-04-26 DIAGNOSIS — E1022 Type 1 diabetes mellitus with diabetic chronic kidney disease: Secondary | ICD-10-CM | POA: Diagnosis not present

## 2023-04-26 DIAGNOSIS — E878 Other disorders of electrolyte and fluid balance, not elsewhere classified: Secondary | ICD-10-CM | POA: Diagnosis not present

## 2023-04-26 DIAGNOSIS — Z992 Dependence on renal dialysis: Secondary | ICD-10-CM | POA: Diagnosis not present

## 2023-04-26 DIAGNOSIS — N186 End stage renal disease: Secondary | ICD-10-CM | POA: Diagnosis not present

## 2023-04-26 DIAGNOSIS — N2589 Other disorders resulting from impaired renal tubular function: Secondary | ICD-10-CM | POA: Diagnosis not present

## 2023-04-26 DIAGNOSIS — D630 Anemia in neoplastic disease: Secondary | ICD-10-CM | POA: Diagnosis not present

## 2023-04-26 DIAGNOSIS — Z4931 Encounter for adequacy testing for hemodialysis: Secondary | ICD-10-CM | POA: Diagnosis not present

## 2023-04-28 DIAGNOSIS — N2589 Other disorders resulting from impaired renal tubular function: Secondary | ICD-10-CM | POA: Diagnosis not present

## 2023-04-28 DIAGNOSIS — E440 Moderate protein-calorie malnutrition: Secondary | ICD-10-CM | POA: Diagnosis not present

## 2023-04-28 DIAGNOSIS — D630 Anemia in neoplastic disease: Secondary | ICD-10-CM | POA: Diagnosis not present

## 2023-04-28 DIAGNOSIS — E1022 Type 1 diabetes mellitus with diabetic chronic kidney disease: Secondary | ICD-10-CM | POA: Diagnosis not present

## 2023-04-28 DIAGNOSIS — Z4931 Encounter for adequacy testing for hemodialysis: Secondary | ICD-10-CM | POA: Diagnosis not present

## 2023-04-28 DIAGNOSIS — E878 Other disorders of electrolyte and fluid balance, not elsewhere classified: Secondary | ICD-10-CM | POA: Diagnosis not present

## 2023-04-28 DIAGNOSIS — Z992 Dependence on renal dialysis: Secondary | ICD-10-CM | POA: Diagnosis not present

## 2023-04-28 DIAGNOSIS — E8770 Fluid overload, unspecified: Secondary | ICD-10-CM | POA: Diagnosis not present

## 2023-04-28 DIAGNOSIS — N186 End stage renal disease: Secondary | ICD-10-CM | POA: Diagnosis not present

## 2023-04-28 DIAGNOSIS — N2581 Secondary hyperparathyroidism of renal origin: Secondary | ICD-10-CM | POA: Diagnosis not present

## 2023-04-28 DIAGNOSIS — Z4932 Encounter for adequacy testing for peritoneal dialysis: Secondary | ICD-10-CM | POA: Diagnosis not present

## 2023-04-28 DIAGNOSIS — K7689 Other specified diseases of liver: Secondary | ICD-10-CM | POA: Diagnosis not present

## 2023-04-28 DIAGNOSIS — D509 Iron deficiency anemia, unspecified: Secondary | ICD-10-CM | POA: Diagnosis not present

## 2023-04-30 DIAGNOSIS — D630 Anemia in neoplastic disease: Secondary | ICD-10-CM | POA: Diagnosis not present

## 2023-04-30 DIAGNOSIS — E8770 Fluid overload, unspecified: Secondary | ICD-10-CM | POA: Diagnosis not present

## 2023-04-30 DIAGNOSIS — N2581 Secondary hyperparathyroidism of renal origin: Secondary | ICD-10-CM | POA: Diagnosis not present

## 2023-04-30 DIAGNOSIS — Z4932 Encounter for adequacy testing for peritoneal dialysis: Secondary | ICD-10-CM | POA: Diagnosis not present

## 2023-04-30 DIAGNOSIS — Z4931 Encounter for adequacy testing for hemodialysis: Secondary | ICD-10-CM | POA: Diagnosis not present

## 2023-04-30 DIAGNOSIS — E1022 Type 1 diabetes mellitus with diabetic chronic kidney disease: Secondary | ICD-10-CM | POA: Diagnosis not present

## 2023-04-30 DIAGNOSIS — E878 Other disorders of electrolyte and fluid balance, not elsewhere classified: Secondary | ICD-10-CM | POA: Diagnosis not present

## 2023-04-30 DIAGNOSIS — N186 End stage renal disease: Secondary | ICD-10-CM | POA: Diagnosis not present

## 2023-04-30 DIAGNOSIS — N2589 Other disorders resulting from impaired renal tubular function: Secondary | ICD-10-CM | POA: Diagnosis not present

## 2023-04-30 DIAGNOSIS — K7689 Other specified diseases of liver: Secondary | ICD-10-CM | POA: Diagnosis not present

## 2023-04-30 DIAGNOSIS — D509 Iron deficiency anemia, unspecified: Secondary | ICD-10-CM | POA: Diagnosis not present

## 2023-04-30 DIAGNOSIS — Z992 Dependence on renal dialysis: Secondary | ICD-10-CM | POA: Diagnosis not present

## 2023-04-30 DIAGNOSIS — E440 Moderate protein-calorie malnutrition: Secondary | ICD-10-CM | POA: Diagnosis not present

## 2023-05-02 DIAGNOSIS — Z79899 Other long term (current) drug therapy: Secondary | ICD-10-CM | POA: Diagnosis not present

## 2023-05-02 DIAGNOSIS — Z4932 Encounter for adequacy testing for peritoneal dialysis: Secondary | ICD-10-CM | POA: Diagnosis not present

## 2023-05-02 DIAGNOSIS — N186 End stage renal disease: Secondary | ICD-10-CM | POA: Diagnosis not present

## 2023-05-02 DIAGNOSIS — Z0489 Encounter for examination and observation for other specified reasons: Secondary | ICD-10-CM | POA: Diagnosis not present

## 2023-05-02 DIAGNOSIS — Z452 Encounter for adjustment and management of vascular access device: Secondary | ICD-10-CM | POA: Diagnosis not present

## 2023-05-02 DIAGNOSIS — M7989 Other specified soft tissue disorders: Secondary | ICD-10-CM | POA: Diagnosis not present

## 2023-05-02 DIAGNOSIS — E1022 Type 1 diabetes mellitus with diabetic chronic kidney disease: Secondary | ICD-10-CM | POA: Diagnosis not present

## 2023-05-02 DIAGNOSIS — I10 Essential (primary) hypertension: Secondary | ICD-10-CM | POA: Diagnosis not present

## 2023-05-02 DIAGNOSIS — E10319 Type 1 diabetes mellitus with unspecified diabetic retinopathy without macular edema: Secondary | ICD-10-CM | POA: Diagnosis not present

## 2023-05-02 DIAGNOSIS — G8918 Other acute postprocedural pain: Secondary | ICD-10-CM | POA: Diagnosis not present

## 2023-05-02 DIAGNOSIS — E8770 Fluid overload, unspecified: Secondary | ICD-10-CM | POA: Diagnosis not present

## 2023-05-02 DIAGNOSIS — Z7682 Awaiting organ transplant status: Secondary | ICD-10-CM | POA: Diagnosis not present

## 2023-05-02 DIAGNOSIS — Z9483 Pancreas transplant status: Secondary | ICD-10-CM | POA: Diagnosis not present

## 2023-05-02 DIAGNOSIS — J45909 Unspecified asthma, uncomplicated: Secondary | ICD-10-CM | POA: Diagnosis not present

## 2023-05-02 DIAGNOSIS — I9581 Postprocedural hypotension: Secondary | ICD-10-CM | POA: Diagnosis not present

## 2023-05-02 DIAGNOSIS — D509 Iron deficiency anemia, unspecified: Secondary | ICD-10-CM | POA: Diagnosis not present

## 2023-05-02 DIAGNOSIS — E875 Hyperkalemia: Secondary | ICD-10-CM | POA: Diagnosis not present

## 2023-05-02 DIAGNOSIS — N2581 Secondary hyperparathyroidism of renal origin: Secondary | ICD-10-CM | POA: Diagnosis not present

## 2023-05-02 DIAGNOSIS — E878 Other disorders of electrolyte and fluid balance, not elsewhere classified: Secondary | ICD-10-CM | POA: Diagnosis not present

## 2023-05-02 DIAGNOSIS — N2589 Other disorders resulting from impaired renal tubular function: Secondary | ICD-10-CM | POA: Diagnosis not present

## 2023-05-02 DIAGNOSIS — D630 Anemia in neoplastic disease: Secondary | ICD-10-CM | POA: Diagnosis not present

## 2023-05-02 DIAGNOSIS — Z4931 Encounter for adequacy testing for hemodialysis: Secondary | ICD-10-CM | POA: Diagnosis not present

## 2023-05-02 DIAGNOSIS — D631 Anemia in chronic kidney disease: Secondary | ICD-10-CM | POA: Diagnosis not present

## 2023-05-02 DIAGNOSIS — D84821 Immunodeficiency due to drugs: Secondary | ICD-10-CM | POA: Diagnosis not present

## 2023-05-02 DIAGNOSIS — I120 Hypertensive chronic kidney disease with stage 5 chronic kidney disease or end stage renal disease: Secondary | ICD-10-CM | POA: Diagnosis not present

## 2023-05-02 DIAGNOSIS — K7689 Other specified diseases of liver: Secondary | ICD-10-CM | POA: Diagnosis not present

## 2023-05-02 DIAGNOSIS — E440 Moderate protein-calorie malnutrition: Secondary | ICD-10-CM | POA: Diagnosis not present

## 2023-05-02 DIAGNOSIS — Z992 Dependence on renal dialysis: Secondary | ICD-10-CM | POA: Diagnosis not present

## 2023-05-03 DIAGNOSIS — D849 Immunodeficiency, unspecified: Secondary | ICD-10-CM | POA: Diagnosis not present

## 2023-05-03 DIAGNOSIS — I129 Hypertensive chronic kidney disease with stage 1 through stage 4 chronic kidney disease, or unspecified chronic kidney disease: Secondary | ICD-10-CM | POA: Diagnosis not present

## 2023-05-03 DIAGNOSIS — T8619 Other complication of kidney transplant: Secondary | ICD-10-CM | POA: Diagnosis not present

## 2023-05-03 DIAGNOSIS — E875 Hyperkalemia: Secondary | ICD-10-CM | POA: Diagnosis not present

## 2023-05-03 DIAGNOSIS — Z7682 Awaiting organ transplant status: Secondary | ICD-10-CM | POA: Diagnosis not present

## 2023-05-03 DIAGNOSIS — Z9483 Pancreas transplant status: Secondary | ICD-10-CM | POA: Diagnosis not present

## 2023-05-03 DIAGNOSIS — Z48298 Encounter for aftercare following other organ transplant: Secondary | ICD-10-CM | POA: Diagnosis not present

## 2023-05-03 DIAGNOSIS — Z940 Kidney transplant status: Secondary | ICD-10-CM | POA: Diagnosis not present

## 2023-05-03 DIAGNOSIS — E1021 Type 1 diabetes mellitus with diabetic nephropathy: Secondary | ICD-10-CM | POA: Diagnosis not present

## 2023-05-04 DIAGNOSIS — R34 Anuria and oliguria: Secondary | ICD-10-CM | POA: Diagnosis not present

## 2023-05-04 DIAGNOSIS — T8619 Other complication of kidney transplant: Secondary | ICD-10-CM | POA: Diagnosis not present

## 2023-05-04 DIAGNOSIS — D849 Immunodeficiency, unspecified: Secondary | ICD-10-CM | POA: Diagnosis not present

## 2023-05-04 DIAGNOSIS — Z940 Kidney transplant status: Secondary | ICD-10-CM | POA: Diagnosis not present

## 2023-05-04 DIAGNOSIS — E875 Hyperkalemia: Secondary | ICD-10-CM | POA: Diagnosis not present

## 2023-05-04 DIAGNOSIS — Z48298 Encounter for aftercare following other organ transplant: Secondary | ICD-10-CM | POA: Diagnosis not present

## 2023-05-05 DIAGNOSIS — E875 Hyperkalemia: Secondary | ICD-10-CM | POA: Diagnosis not present

## 2023-05-05 DIAGNOSIS — Z48298 Encounter for aftercare following other organ transplant: Secondary | ICD-10-CM | POA: Diagnosis not present

## 2023-05-05 DIAGNOSIS — Z4659 Encounter for fitting and adjustment of other gastrointestinal appliance and device: Secondary | ICD-10-CM | POA: Diagnosis not present

## 2023-05-05 DIAGNOSIS — Z9483 Pancreas transplant status: Secondary | ICD-10-CM | POA: Diagnosis not present

## 2023-05-05 DIAGNOSIS — D849 Immunodeficiency, unspecified: Secondary | ICD-10-CM | POA: Diagnosis not present

## 2023-05-05 DIAGNOSIS — Z940 Kidney transplant status: Secondary | ICD-10-CM | POA: Diagnosis not present

## 2023-05-05 DIAGNOSIS — T8619 Other complication of kidney transplant: Secondary | ICD-10-CM | POA: Diagnosis not present

## 2023-05-06 DIAGNOSIS — Z940 Kidney transplant status: Secondary | ICD-10-CM | POA: Diagnosis not present

## 2023-05-06 DIAGNOSIS — D849 Immunodeficiency, unspecified: Secondary | ICD-10-CM | POA: Diagnosis not present

## 2023-05-06 DIAGNOSIS — T8619 Other complication of kidney transplant: Secondary | ICD-10-CM | POA: Diagnosis not present

## 2023-05-06 DIAGNOSIS — Z48298 Encounter for aftercare following other organ transplant: Secondary | ICD-10-CM | POA: Diagnosis not present

## 2023-05-06 DIAGNOSIS — E875 Hyperkalemia: Secondary | ICD-10-CM | POA: Diagnosis not present

## 2023-05-07 DIAGNOSIS — Z940 Kidney transplant status: Secondary | ICD-10-CM | POA: Diagnosis not present

## 2023-05-07 DIAGNOSIS — T8619 Other complication of kidney transplant: Secondary | ICD-10-CM | POA: Diagnosis not present

## 2023-05-07 DIAGNOSIS — D849 Immunodeficiency, unspecified: Secondary | ICD-10-CM | POA: Diagnosis not present

## 2023-05-07 DIAGNOSIS — E875 Hyperkalemia: Secondary | ICD-10-CM | POA: Diagnosis not present

## 2023-05-07 DIAGNOSIS — Z48298 Encounter for aftercare following other organ transplant: Secondary | ICD-10-CM | POA: Diagnosis not present

## 2023-05-07 DIAGNOSIS — R600 Localized edema: Secondary | ICD-10-CM | POA: Diagnosis not present

## 2023-05-08 DIAGNOSIS — Z48298 Encounter for aftercare following other organ transplant: Secondary | ICD-10-CM | POA: Diagnosis not present

## 2023-05-08 DIAGNOSIS — E875 Hyperkalemia: Secondary | ICD-10-CM | POA: Diagnosis not present

## 2023-05-08 DIAGNOSIS — T8619 Other complication of kidney transplant: Secondary | ICD-10-CM | POA: Diagnosis not present

## 2023-05-08 DIAGNOSIS — Z940 Kidney transplant status: Secondary | ICD-10-CM | POA: Diagnosis not present

## 2023-05-08 DIAGNOSIS — D849 Immunodeficiency, unspecified: Secondary | ICD-10-CM | POA: Diagnosis not present

## 2023-05-09 DIAGNOSIS — Z940 Kidney transplant status: Secondary | ICD-10-CM | POA: Diagnosis not present

## 2023-05-09 DIAGNOSIS — Z79899 Other long term (current) drug therapy: Secondary | ICD-10-CM | POA: Diagnosis not present

## 2023-05-09 DIAGNOSIS — Z9483 Pancreas transplant status: Secondary | ICD-10-CM | POA: Diagnosis not present

## 2023-05-09 DIAGNOSIS — D849 Immunodeficiency, unspecified: Secondary | ICD-10-CM | POA: Diagnosis not present

## 2023-05-11 DIAGNOSIS — Z940 Kidney transplant status: Secondary | ICD-10-CM | POA: Diagnosis not present

## 2023-05-11 DIAGNOSIS — Z9483 Pancreas transplant status: Secondary | ICD-10-CM | POA: Diagnosis not present

## 2023-05-11 DIAGNOSIS — Z5181 Encounter for therapeutic drug level monitoring: Secondary | ICD-10-CM | POA: Diagnosis not present

## 2023-05-13 DIAGNOSIS — Z992 Dependence on renal dialysis: Secondary | ICD-10-CM | POA: Diagnosis not present

## 2023-05-13 DIAGNOSIS — G4700 Insomnia, unspecified: Secondary | ICD-10-CM | POA: Diagnosis not present

## 2023-05-13 DIAGNOSIS — Z9189 Other specified personal risk factors, not elsewhere classified: Secondary | ICD-10-CM | POA: Diagnosis not present

## 2023-05-13 DIAGNOSIS — D84821 Immunodeficiency due to drugs: Secondary | ICD-10-CM | POA: Diagnosis not present

## 2023-05-13 DIAGNOSIS — Z79899 Other long term (current) drug therapy: Secondary | ICD-10-CM | POA: Diagnosis not present

## 2023-05-13 DIAGNOSIS — Z9483 Pancreas transplant status: Secondary | ICD-10-CM | POA: Diagnosis not present

## 2023-05-13 DIAGNOSIS — Z792 Long term (current) use of antibiotics: Secondary | ICD-10-CM | POA: Diagnosis not present

## 2023-05-13 DIAGNOSIS — Z940 Kidney transplant status: Secondary | ICD-10-CM | POA: Diagnosis not present

## 2023-05-13 DIAGNOSIS — D849 Immunodeficiency, unspecified: Secondary | ICD-10-CM | POA: Diagnosis not present

## 2023-05-13 DIAGNOSIS — N186 End stage renal disease: Secondary | ICD-10-CM | POA: Diagnosis not present

## 2023-05-13 DIAGNOSIS — Z79624 Long term (current) use of inhibitors of nucleotide synthesis: Secondary | ICD-10-CM | POA: Diagnosis not present

## 2023-05-13 DIAGNOSIS — T380X5A Adverse effect of glucocorticoids and synthetic analogues, initial encounter: Secondary | ICD-10-CM | POA: Diagnosis not present

## 2023-05-13 DIAGNOSIS — Z5181 Encounter for therapeutic drug level monitoring: Secondary | ICD-10-CM | POA: Diagnosis not present

## 2023-05-13 DIAGNOSIS — I120 Hypertensive chronic kidney disease with stage 5 chronic kidney disease or end stage renal disease: Secondary | ICD-10-CM | POA: Diagnosis not present

## 2023-05-13 DIAGNOSIS — E1122 Type 2 diabetes mellitus with diabetic chronic kidney disease: Secondary | ICD-10-CM | POA: Diagnosis not present

## 2023-05-13 DIAGNOSIS — R5381 Other malaise: Secondary | ICD-10-CM | POA: Diagnosis not present

## 2023-05-13 DIAGNOSIS — I10 Essential (primary) hypertension: Secondary | ICD-10-CM | POA: Diagnosis not present

## 2023-05-13 DIAGNOSIS — E785 Hyperlipidemia, unspecified: Secondary | ICD-10-CM | POA: Diagnosis not present

## 2023-05-13 DIAGNOSIS — G8918 Other acute postprocedural pain: Secondary | ICD-10-CM | POA: Diagnosis not present

## 2023-05-13 DIAGNOSIS — Z960 Presence of urogenital implants: Secondary | ICD-10-CM | POA: Diagnosis not present

## 2023-05-13 DIAGNOSIS — R739 Hyperglycemia, unspecified: Secondary | ICD-10-CM | POA: Diagnosis not present

## 2023-05-13 DIAGNOSIS — Z79621 Long term (current) use of calcineurin inhibitor: Secondary | ICD-10-CM | POA: Diagnosis not present

## 2023-05-16 ENCOUNTER — Other Ambulatory Visit: Payer: Self-pay

## 2023-05-16 ENCOUNTER — Encounter: Payer: Self-pay | Admitting: Internal Medicine

## 2023-05-16 DIAGNOSIS — E1065 Type 1 diabetes mellitus with hyperglycemia: Secondary | ICD-10-CM | POA: Diagnosis not present

## 2023-05-16 MED ORDER — ONETOUCH VERIO W/DEVICE KIT: PACK | 0 refills | Status: DC

## 2023-05-20 DIAGNOSIS — Z940 Kidney transplant status: Secondary | ICD-10-CM | POA: Diagnosis not present

## 2023-05-20 DIAGNOSIS — Z792 Long term (current) use of antibiotics: Secondary | ICD-10-CM | POA: Diagnosis not present

## 2023-05-20 DIAGNOSIS — Z960 Presence of urogenital implants: Secondary | ICD-10-CM | POA: Diagnosis not present

## 2023-05-20 DIAGNOSIS — I10 Essential (primary) hypertension: Secondary | ICD-10-CM | POA: Diagnosis not present

## 2023-05-20 DIAGNOSIS — E212 Other hyperparathyroidism: Secondary | ICD-10-CM | POA: Diagnosis not present

## 2023-05-20 DIAGNOSIS — G8918 Other acute postprocedural pain: Secondary | ICD-10-CM | POA: Diagnosis not present

## 2023-05-20 DIAGNOSIS — D849 Immunodeficiency, unspecified: Secondary | ICD-10-CM | POA: Diagnosis not present

## 2023-05-20 DIAGNOSIS — Z9483 Pancreas transplant status: Secondary | ICD-10-CM | POA: Diagnosis not present

## 2023-05-24 DIAGNOSIS — Z940 Kidney transplant status: Secondary | ICD-10-CM | POA: Diagnosis not present

## 2023-05-24 DIAGNOSIS — Z5181 Encounter for therapeutic drug level monitoring: Secondary | ICD-10-CM | POA: Diagnosis not present

## 2023-05-24 DIAGNOSIS — Z9483 Pancreas transplant status: Secondary | ICD-10-CM | POA: Diagnosis not present

## 2023-05-24 DIAGNOSIS — D849 Immunodeficiency, unspecified: Secondary | ICD-10-CM | POA: Diagnosis not present

## 2023-05-26 DIAGNOSIS — Z79899 Other long term (current) drug therapy: Secondary | ICD-10-CM | POA: Diagnosis not present

## 2023-05-26 DIAGNOSIS — D649 Anemia, unspecified: Secondary | ICD-10-CM | POA: Diagnosis not present

## 2023-05-26 DIAGNOSIS — Z0389 Encounter for observation for other suspected diseases and conditions ruled out: Secondary | ICD-10-CM | POA: Diagnosis not present

## 2023-05-26 DIAGNOSIS — D84821 Immunodeficiency due to drugs: Secondary | ICD-10-CM | POA: Diagnosis not present

## 2023-05-26 DIAGNOSIS — E872 Acidosis, unspecified: Secondary | ICD-10-CM | POA: Diagnosis not present

## 2023-05-26 DIAGNOSIS — E875 Hyperkalemia: Secondary | ICD-10-CM | POA: Diagnosis not present

## 2023-05-26 DIAGNOSIS — Z992 Dependence on renal dialysis: Secondary | ICD-10-CM | POA: Diagnosis not present

## 2023-05-26 DIAGNOSIS — I10 Essential (primary) hypertension: Secondary | ICD-10-CM | POA: Diagnosis not present

## 2023-05-26 DIAGNOSIS — D849 Immunodeficiency, unspecified: Secondary | ICD-10-CM | POA: Diagnosis not present

## 2023-05-26 DIAGNOSIS — N186 End stage renal disease: Secondary | ICD-10-CM | POA: Diagnosis not present

## 2023-05-26 DIAGNOSIS — Z5181 Encounter for therapeutic drug level monitoring: Secondary | ICD-10-CM | POA: Diagnosis not present

## 2023-05-26 DIAGNOSIS — Z940 Kidney transplant status: Secondary | ICD-10-CM | POA: Diagnosis not present

## 2023-05-26 DIAGNOSIS — J45909 Unspecified asthma, uncomplicated: Secondary | ICD-10-CM | POA: Diagnosis not present

## 2023-05-26 DIAGNOSIS — Z9483 Pancreas transplant status: Secondary | ICD-10-CM | POA: Diagnosis not present

## 2023-05-26 DIAGNOSIS — E10319 Type 1 diabetes mellitus with unspecified diabetic retinopathy without macular edema: Secondary | ICD-10-CM | POA: Diagnosis not present

## 2023-05-26 DIAGNOSIS — D638 Anemia in other chronic diseases classified elsewhere: Secondary | ICD-10-CM | POA: Diagnosis not present

## 2023-05-29 ENCOUNTER — Other Ambulatory Visit: Payer: Self-pay

## 2023-05-29 ENCOUNTER — Emergency Department (HOSPITAL_COMMUNITY)
Admission: EM | Admit: 2023-05-29 | Discharge: 2023-05-30 | Disposition: A | Discharge status: Other Acute Inpatient Hospital | Attending: Emergency Medicine | Admitting: Emergency Medicine

## 2023-05-29 DIAGNOSIS — Z940 Kidney transplant status: Secondary | ICD-10-CM | POA: Insufficient documentation

## 2023-05-29 DIAGNOSIS — Z7952 Long term (current) use of systemic steroids: Secondary | ICD-10-CM | POA: Insufficient documentation

## 2023-05-29 DIAGNOSIS — Z7951 Long term (current) use of inhaled steroids: Secondary | ICD-10-CM | POA: Diagnosis not present

## 2023-05-29 DIAGNOSIS — Z79899 Other long term (current) drug therapy: Secondary | ICD-10-CM | POA: Diagnosis not present

## 2023-05-29 DIAGNOSIS — I10 Essential (primary) hypertension: Secondary | ICD-10-CM | POA: Insufficient documentation

## 2023-05-29 DIAGNOSIS — E109 Type 1 diabetes mellitus without complications: Secondary | ICD-10-CM | POA: Diagnosis not present

## 2023-05-29 DIAGNOSIS — R231 Pallor: Secondary | ICD-10-CM | POA: Diagnosis not present

## 2023-05-29 DIAGNOSIS — R1084 Generalized abdominal pain: Secondary | ICD-10-CM | POA: Diagnosis not present

## 2023-05-29 DIAGNOSIS — K529 Noninfective gastroenteritis and colitis, unspecified: Secondary | ICD-10-CM | POA: Diagnosis not present

## 2023-05-29 DIAGNOSIS — R197 Diarrhea, unspecified: Secondary | ICD-10-CM | POA: Diagnosis not present

## 2023-05-29 DIAGNOSIS — R0602 Shortness of breath: Secondary | ICD-10-CM | POA: Diagnosis not present

## 2023-05-29 DIAGNOSIS — Z794 Long term (current) use of insulin: Secondary | ICD-10-CM | POA: Diagnosis not present

## 2023-05-29 DIAGNOSIS — J45909 Unspecified asthma, uncomplicated: Secondary | ICD-10-CM | POA: Diagnosis not present

## 2023-05-29 DIAGNOSIS — I700 Atherosclerosis of aorta: Secondary | ICD-10-CM | POA: Diagnosis not present

## 2023-05-29 DIAGNOSIS — K2970 Gastritis, unspecified, without bleeding: Secondary | ICD-10-CM | POA: Diagnosis not present

## 2023-05-29 DIAGNOSIS — R1031 Right lower quadrant pain: Secondary | ICD-10-CM

## 2023-05-29 NOTE — ED Triage Notes (Signed)
 Pt BIB EMS from home. CC sudden onset lower abd pain, radiates to R shoulder. Endorses N/V and diarrhea. 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 who recommended her go to nearest hosp.  No oxygen baseline. SOB 80's RA. 100% 2L [place].  EMS VS 130/90, HR 80, cbg 113, T 97.5

## 2023-05-30 ENCOUNTER — Emergency Department (HOSPITAL_COMMUNITY)

## 2023-05-30 DIAGNOSIS — T83122A Displacement of urinary stent, initial encounter: Secondary | ICD-10-CM | POA: Diagnosis not present

## 2023-05-30 DIAGNOSIS — E875 Hyperkalemia: Secondary | ICD-10-CM | POA: Diagnosis not present

## 2023-05-30 DIAGNOSIS — E10319 Type 1 diabetes mellitus with unspecified diabetic retinopathy without macular edema: Secondary | ICD-10-CM | POA: Diagnosis not present

## 2023-05-30 DIAGNOSIS — D631 Anemia in chronic kidney disease: Secondary | ICD-10-CM | POA: Diagnosis not present

## 2023-05-30 DIAGNOSIS — I973 Postprocedural hypertension: Secondary | ICD-10-CM | POA: Diagnosis not present

## 2023-05-30 DIAGNOSIS — I700 Atherosclerosis of aorta: Secondary | ICD-10-CM | POA: Diagnosis not present

## 2023-05-30 DIAGNOSIS — R188 Other ascites: Secondary | ICD-10-CM | POA: Diagnosis not present

## 2023-05-30 DIAGNOSIS — E1022 Type 1 diabetes mellitus with diabetic chronic kidney disease: Secondary | ICD-10-CM | POA: Diagnosis not present

## 2023-05-30 DIAGNOSIS — I82612 Acute embolism and thrombosis of superficial veins of left upper extremity: Secondary | ICD-10-CM | POA: Diagnosis not present

## 2023-05-30 DIAGNOSIS — R918 Other nonspecific abnormal finding of lung field: Secondary | ICD-10-CM | POA: Diagnosis not present

## 2023-05-30 DIAGNOSIS — B3789 Other sites of candidiasis: Secondary | ICD-10-CM | POA: Diagnosis not present

## 2023-05-30 DIAGNOSIS — E872 Acidosis, unspecified: Secondary | ICD-10-CM | POA: Diagnosis not present

## 2023-05-30 DIAGNOSIS — G8918 Other acute postprocedural pain: Secondary | ICD-10-CM | POA: Diagnosis not present

## 2023-05-30 DIAGNOSIS — E878 Other disorders of electrolyte and fluid balance, not elsewhere classified: Secondary | ICD-10-CM | POA: Diagnosis not present

## 2023-05-30 DIAGNOSIS — J45909 Unspecified asthma, uncomplicated: Secondary | ICD-10-CM | POA: Diagnosis not present

## 2023-05-30 DIAGNOSIS — Z9483 Pancreas transplant status: Secondary | ICD-10-CM | POA: Diagnosis not present

## 2023-05-30 DIAGNOSIS — Z1632 Resistance to antifungal drug(s): Secondary | ICD-10-CM | POA: Diagnosis not present

## 2023-05-30 DIAGNOSIS — Z7952 Long term (current) use of systemic steroids: Secondary | ICD-10-CM | POA: Diagnosis not present

## 2023-05-30 DIAGNOSIS — E109 Type 1 diabetes mellitus without complications: Secondary | ICD-10-CM | POA: Diagnosis not present

## 2023-05-30 DIAGNOSIS — K659 Peritonitis, unspecified: Secondary | ICD-10-CM | POA: Diagnosis not present

## 2023-05-30 DIAGNOSIS — D638 Anemia in other chronic diseases classified elsewhere: Secondary | ICD-10-CM | POA: Diagnosis not present

## 2023-05-30 DIAGNOSIS — I10 Essential (primary) hypertension: Secondary | ICD-10-CM | POA: Diagnosis not present

## 2023-05-30 DIAGNOSIS — Z940 Kidney transplant status: Secondary | ICD-10-CM | POA: Diagnosis not present

## 2023-05-30 DIAGNOSIS — I169 Hypertensive crisis, unspecified: Secondary | ICD-10-CM | POA: Diagnosis not present

## 2023-05-30 DIAGNOSIS — K529 Noninfective gastroenteritis and colitis, unspecified: Secondary | ICD-10-CM | POA: Diagnosis not present

## 2023-05-30 DIAGNOSIS — I129 Hypertensive chronic kidney disease with stage 1 through stage 4 chronic kidney disease, or unspecified chronic kidney disease: Secondary | ICD-10-CM | POA: Diagnosis not present

## 2023-05-30 DIAGNOSIS — K631 Perforation of intestine (nontraumatic): Secondary | ICD-10-CM | POA: Diagnosis not present

## 2023-05-30 DIAGNOSIS — K6389 Other specified diseases of intestine: Secondary | ICD-10-CM | POA: Diagnosis not present

## 2023-05-30 DIAGNOSIS — B379 Candidiasis, unspecified: Secondary | ICD-10-CM | POA: Diagnosis not present

## 2023-05-30 DIAGNOSIS — Z980 Intestinal bypass and anastomosis status: Secondary | ICD-10-CM | POA: Diagnosis not present

## 2023-05-30 DIAGNOSIS — Z792 Long term (current) use of antibiotics: Secondary | ICD-10-CM | POA: Diagnosis not present

## 2023-05-30 DIAGNOSIS — D84821 Immunodeficiency due to drugs: Secondary | ICD-10-CM | POA: Diagnosis not present

## 2023-05-30 DIAGNOSIS — Z7951 Long term (current) use of inhaled steroids: Secondary | ICD-10-CM | POA: Diagnosis not present

## 2023-05-30 DIAGNOSIS — R0602 Shortness of breath: Secondary | ICD-10-CM | POA: Diagnosis not present

## 2023-05-30 DIAGNOSIS — R339 Retention of urine, unspecified: Secondary | ICD-10-CM | POA: Diagnosis not present

## 2023-05-30 DIAGNOSIS — D849 Immunodeficiency, unspecified: Secondary | ICD-10-CM | POA: Diagnosis not present

## 2023-05-30 DIAGNOSIS — T86898 Other complications of other transplanted tissue: Secondary | ICD-10-CM | POA: Diagnosis not present

## 2023-05-30 DIAGNOSIS — N189 Chronic kidney disease, unspecified: Secondary | ICD-10-CM | POA: Diagnosis not present

## 2023-05-30 DIAGNOSIS — R1031 Right lower quadrant pain: Secondary | ICD-10-CM | POA: Diagnosis present

## 2023-05-30 DIAGNOSIS — K651 Peritoneal abscess: Secondary | ICD-10-CM | POA: Diagnosis not present

## 2023-05-30 DIAGNOSIS — Z79899 Other long term (current) drug therapy: Secondary | ICD-10-CM | POA: Diagnosis not present

## 2023-05-30 DIAGNOSIS — N1330 Unspecified hydronephrosis: Secondary | ICD-10-CM | POA: Diagnosis not present

## 2023-05-30 DIAGNOSIS — K5989 Other specified functional intestinal disorders: Secondary | ICD-10-CM | POA: Diagnosis not present

## 2023-05-30 DIAGNOSIS — B954 Other streptococcus as the cause of diseases classified elsewhere: Secondary | ICD-10-CM | POA: Diagnosis not present

## 2023-05-30 DIAGNOSIS — K265 Chronic or unspecified duodenal ulcer with perforation: Secondary | ICD-10-CM | POA: Diagnosis not present

## 2023-05-30 DIAGNOSIS — R109 Unspecified abdominal pain: Secondary | ICD-10-CM | POA: Diagnosis not present

## 2023-05-30 DIAGNOSIS — E538 Deficiency of other specified B group vitamins: Secondary | ICD-10-CM | POA: Diagnosis not present

## 2023-05-30 DIAGNOSIS — Z794 Long term (current) use of insulin: Secondary | ICD-10-CM | POA: Diagnosis not present

## 2023-05-30 DIAGNOSIS — K9189 Other postprocedural complications and disorders of digestive system: Secondary | ICD-10-CM | POA: Diagnosis not present

## 2023-05-30 LAB — CBC WITH DIFFERENTIAL/PLATELET
HCT: 35.4 % — ABNORMAL LOW (ref 36.0–46.0)
HCT: 37.7 % (ref 36.0–46.0)
Hemoglobin: 11 g/dL — ABNORMAL LOW (ref 12.0–15.0)
Hemoglobin: 11.5 g/dL — ABNORMAL LOW (ref 12.0–15.0)
MCH: 28.5 pg (ref 26.0–34.0)
MCH: 29.3 pg (ref 26.0–34.0)
MCHC: 30.5 g/dL (ref 30.0–36.0)
MCHC: 31.1 g/dL (ref 30.0–36.0)
MCV: 93.3 fL (ref 80.0–100.0)
MCV: 94.1 fL (ref 80.0–100.0)
Platelets: 270 10*3/uL (ref 150–400)
Platelets: 338 10*3/uL (ref 150–400)
RBC: 3.76 MIL/uL — ABNORMAL LOW (ref 3.87–5.11)
RBC: 4.04 MIL/uL (ref 3.87–5.11)
RDW: 18.7 % — ABNORMAL HIGH (ref 11.5–15.5)
RDW: 18.7 % — ABNORMAL HIGH (ref 11.5–15.5)
WBC: 1.7 10*3/uL — ABNORMAL LOW (ref 4.0–10.5)
WBC: 1.9 10*3/uL — ABNORMAL LOW (ref 4.0–10.5)
nRBC: 0 % (ref 0.0–0.2)
nRBC: 0 % (ref 0.0–0.2)

## 2023-05-30 LAB — COMPREHENSIVE METABOLIC PANEL WITH GFR
ALT: 11 U/L (ref 0–44)
AST: 15 U/L (ref 15–41)
Albumin: 3.9 g/dL (ref 3.5–5.0)
Alkaline Phosphatase: 152 U/L — ABNORMAL HIGH (ref 38–126)
Anion gap: 14 (ref 5–15)
BUN: 33 mg/dL — ABNORMAL HIGH (ref 6–20)
CO2: 15 mmol/L — ABNORMAL LOW (ref 22–32)
Calcium: 9.8 mg/dL (ref 8.9–10.3)
Chloride: 106 mmol/L (ref 98–111)
Creatinine, Ser: 1.64 mg/dL — ABNORMAL HIGH (ref 0.44–1.00)
GFR, Estimated: 42 mL/min — ABNORMAL LOW (ref >60)
Glucose, Bld: 109 mg/dL — ABNORMAL HIGH (ref 70–99)
Potassium: 5.5 mmol/L — ABNORMAL HIGH (ref 3.5–5.1)
Sodium: 135 mmol/L (ref 135–145)
Total Bilirubin: 0.4 mg/dL (ref 0.0–1.2)
Total Protein: 7.6 g/dL (ref 6.5–8.1)

## 2023-05-30 LAB — TROPONIN I (HIGH SENSITIVITY)
Troponin I (High Sensitivity): 23 ng/L — ABNORMAL HIGH (ref <18)
Troponin I (High Sensitivity): 19 ng/L — ABNORMAL HIGH (ref <18)

## 2023-05-30 LAB — I-STAT CG4 LACTIC ACID, ED
Lactic Acid, Venous: 1.4 mmol/L (ref 0.5–1.9)
Lactic Acid, Venous: 2.1 mmol/L (ref 0.5–1.9)

## 2023-05-30 LAB — HCG, SERUM, QUALITATIVE: Preg, Serum: NEGATIVE

## 2023-05-30 LAB — LIPASE, BLOOD: Lipase: 37 U/L (ref 11–51)

## 2023-05-30 MED ORDER — SODIUM CHLORIDE 0.9 % IV BOLUS
1000.0000 mL | Freq: Once | INTRAVENOUS | Status: AC
  Administered 2023-05-30: 1000 mL via INTRAVENOUS

## 2023-05-30 MED ORDER — ONDANSETRON HCL 4 MG/2ML IJ SOLN
4.0000 mg | Freq: Once | INTRAMUSCULAR | Status: AC
  Administered 2023-05-30: 4 mg via INTRAVENOUS
  Filled 2023-05-30: qty 2

## 2023-05-30 MED ORDER — SODIUM ZIRCONIUM CYCLOSILICATE 10 G PO PACK
10.0000 g | PACK | ORAL | Status: AC
  Administered 2023-05-30: 10 g via ORAL
  Filled 2023-05-30: qty 1

## 2023-05-30 MED ORDER — ALBUTEROL SULFATE HFA 108 (90 BASE) MCG/ACT IN AERS
2.0000 | INHALATION_SPRAY | Freq: Once | RESPIRATORY_TRACT | Status: AC
  Administered 2023-05-30: 2 via RESPIRATORY_TRACT
  Filled 2023-05-30: qty 6.7

## 2023-05-30 MED ORDER — HYDROMORPHONE HCL 1 MG/ML IJ SOLN
1.0000 mg | Freq: Once | INTRAMUSCULAR | Status: AC
  Administered 2023-05-30: 1 mg via INTRAVENOUS
  Filled 2023-05-30: qty 1

## 2023-05-30 MED ORDER — MORPHINE SULFATE (PF) 4 MG/ML IV SOLN
4.0000 mg | INTRAVENOUS | Status: DC | PRN
  Administered 2023-05-30: 4 mg via INTRAVENOUS
  Filled 2023-05-30: qty 1

## 2023-05-30 MED ORDER — MORPHINE SULFATE (PF) 4 MG/ML IV SOLN
4.0000 mg | Freq: Once | INTRAVENOUS | Status: AC
  Administered 2023-05-30: 4 mg via INTRAVENOUS
  Filled 2023-05-30: qty 1

## 2023-05-30 MED ORDER — MORPHINE SULFATE (PF) 4 MG/ML IV SOLN: 4.0000 mg | Freq: Once | INTRAVENOUS | Status: DC

## 2023-05-30 MED ORDER — FENTANYL CITRATE PF 50 MCG/ML IJ SOSY
50.0000 ug | PREFILLED_SYRINGE | Freq: Once | INTRAMUSCULAR | Status: AC
  Administered 2023-05-30: 50 ug via INTRAVENOUS
  Filled 2023-05-30: qty 1

## 2023-05-30 NOTE — ED Provider Notes (Signed)
 Patient's care assumed by me at 6:30 AM.  Patient has a past medical history of kidney and pancreas transplant done at [facility].  Patient has been accepted at [facility] to room 06/26/2007.  Patient is pending transfer.  I will continue to monitor her until transport team arrives.   Sandi Crosby, PA-C 05/30/23 1610    Tonya Fredrickson, MD 05/30/23 1714

## 2023-05-30 NOTE — ED Provider Notes (Signed)
 [place] EMERGENCY DEPARTMENT AT [facility] Provider Note   CSN: [id number] Arrival date & time: 05/29/23  2337     History  Chief Complaint  Patient presents with   Shortness of Breath   Abdominal Pain    Barbara Donovan is a 33 y.o. 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  Patient presents to ED for evaluation of right lower quadrant abdominal pain radiating into her left lower quadrant and then into her right shoulder.  She reports that all of her symptoms began about 1-1/2 hours ago.  She reports she did "spit up" at home but denies any nausea, vomiting here.  She states that she had 1 loose bowel movement after pain began which was nonbloody.  She denies fevers, dysuria at home.  Denies vaginal bleeding.  Was seen at [facility] and admitted from 5/8 to 5/10 secondary to anemia, received transfusion.  Reports she is currently on CellCept immune modulating medication.  Apparently patient was found be hypoxic in the field with EMS however here her oxygen saturation is 100% room air.  She denies shortness of breath or chest pain.  Denies dysuria.   Shortness of Breath Associated symptoms: abdominal pain and vomiting   Associated symptoms: no chest pain and no fever   Abdominal Pain Associated symptoms: diarrhea, nausea and vomiting   Associated symptoms: no chest pain, no dysuria, no fever, no shortness of breath and no vaginal bleeding        Home Medications Prior to Admission medications   Medication Sig Start Date End Date Taking? Authorizing Provider  acyclovir (ZOVIRAX) 400 MG tablet Take 400 mg by mouth 2 (two) times daily. 05/03/23  Yes [provider]  carvedilol  (COREG ) 25 MG tablet Take 1 tablet (25 mg total) by mouth 2 (two) times daily. 03/07/19  Yes Flo Hummingbird, PA-C   famotidine  (PEPCID ) 20 MG tablet Take 1 tablet by mouth 2 (two) times daily. 05/09/23 05/08/24 Yes [provider]  mirtazapine  (REMERON ) 15 MG tablet Take 1 tablet (15 mg total) by mouth at bedtime. Do not take medicine in the morning and drive. Patient taking differently: Take 15 mg by mouth at bedtime. 10/05/22 10/05/23 Yes Bender, Sherline Distel, DO  mycophenolate (CELLCEPT) 250 MG capsule Take 1,000 mg by mouth 2 (two) times daily. 05/03/23  Yes [provider]  predniSONE (DELTASONE) 5 MG tablet Take 20 mg by mouth daily. 05/28/23  Yes [provider]  sodium bicarbonate  650 MG tablet Take 1,300 mg by mouth 2 (two) times daily. 05/23/23 05/22/24 Yes [provider]  sulfamethoxazole-trimethoprim (BACTRIM DS) 800-160 MG tablet Take 1 tablet by mouth every Monday, Wednesday, and Friday.   Yes [provider]  tacrolimus (PROGRAF) 1 MG capsule Take 7 mg by mouth 2 (two) times daily. 05/28/23 05/22/24 Yes [provider]  acetaminophen  (TYLENOL ) 325 MG tablet Take 650 mg by mouth every 6 (six) hours as needed for mild pain.    [provider]  amLODipine  (NORVASC ) 10 MG tablet Take 1 tablet (10 mg total) by mouth daily. Patient not taking: Reported on 05/30/2023 03/09/20   Gonfa, Taye T, MD  Blood Glucose Monitoring Suppl [facility] VERIO) w/Device KIT Use to check blood sugar 2-3 times daily 05/16/23   Shamleffer, Ibtehal Jaralla, MD  calcitRIOL  (ROCALTROL ) 0.25 MCG capsule Take by mouth. Patient not taking: Reported on 05/30/2023 01/20/23  [provider]  ethyl chloride spray Apply 1 Application topically 4 (four) times a week. Apply to arm on Monday, Tuesday, Thursday, and Friday 05/09/22   [provider]  insulin  degludec (TRESIBA  FLEXTOUCH) 200 UNIT/ML FlexTouch Pen Inject 14 Units into the skin daily in the afternoon. Patient not taking: Reported on 05/30/2023 02/28/23   Shamleffer, Ibtehal Jaralla, MD  insulin  lispro (HUMALOG  KWIKPEN)  100 UNIT/ML KwikPen Max daily 30 units Patient not taking: Reported on 05/30/2023 02/28/23   Shamleffer, Julian Obey, MD  Insulin  Pen Needle 32G X 4 MM MISC 1 Device by Does not apply route in the morning, at noon, in the evening, and at bedtime. 02/28/23   Shamleffer, Ibtehal Jaralla, MD  lidocaine  (LMX) 4 % cream Apply 1 Application topically once a week. Apply on Monday,Tuesday, Thursday, and Friday    [provider]  lidocaine -prilocaine  (EMLA ) cream SMARTSIG:sparingly Topical As Directed Patient not taking: Reported on 05/30/2023    [provider]  multivitamin (RENA-VIT) TABS tablet Take 1 tablet by mouth at bedtime. Patient not taking: Reported on 05/30/2023 03/08/20   Gonfa, Taye T, MD  SENSIPAR  60 MG tablet Take 60 mg by mouth at bedtime. Patient not taking: Reported on 05/30/2023 12/23/22   [provider]  sucroferric oxyhydroxide (VELPHORO ) 500 MG chewable tablet Chew 1,500 mg by mouth 3 (three) times daily with meals. Patient not taking: Reported on 05/30/2023    [provider]      Allergies    Patient has no known allergies.    Review of Systems   Review of Systems  Constitutional:  Negative for fever.  Respiratory:  Negative for shortness of breath.   Cardiovascular:  Negative for chest pain.  Gastrointestinal:  Positive for abdominal pain, diarrhea, nausea and vomiting.  Genitourinary:  Negative for dysuria and vaginal bleeding.  All other systems reviewed and are negative.   Physical Exam Updated Vital Signs BP 112/60   Pulse 91   Temp 99 F (37.2 C) (Oral)   Resp 16   Ht 5\' 6"  (1.676 m)   Wt 68 kg   SpO2 100%   BMI 24.21 kg/m  Physical Exam Vitals and nursing note reviewed.  Constitutional:      General: She is not in acute distress.    Appearance: She is well-developed.  HENT:     Head: Normocephalic and atraumatic.  Eyes:     Conjunctiva/sclera: Conjunctivae normal.  Cardiovascular:     Rate and Rhythm: Normal  rate and regular rhythm.     Heart sounds: No murmur heard. Pulmonary:     Effort: Pulmonary effort is normal. No respiratory distress.     Breath sounds: Normal breath sounds.  Abdominal:     Palpations: Abdomen is soft.     Tenderness: There is abdominal tenderness.     Comments: RLQ TTP.  No overlying skin change, rebound or guarding.  No CVA tenderness bilaterally.  Musculoskeletal:        General: No swelling.     Cervical back: Neck supple.     Right lower leg: No edema.     Left lower leg: No edema.  Skin:    General: Skin is warm and dry.     Capillary Refill: Capillary refill takes less than 2 seconds.  Neurological:     Mental Status: She is alert and oriented to person, place, and time. Mental status is at baseline.  Psychiatric:        Mood and Affect: Mood  normal.     ED Results / Procedures / Treatments   Labs (all labs ordered are listed, but only abnormal results are displayed) Labs Reviewed  CBC WITH DIFFERENTIAL/PLATELET - Abnormal; Notable for the following components:      Result Value   WBC 1.7 (*)    RBC 3.76 (*)    Hemoglobin 11.0 (*)    HCT 35.4 (*)    RDW 18.7 (*)    All other components within normal limits  CBC WITH DIFFERENTIAL/PLATELET - Abnormal; Notable for the following components:   WBC 1.9 (*)    Hemoglobin 11.5 (*)    RDW 18.7 (*)    All other components within normal limits  COMPREHENSIVE METABOLIC PANEL WITH GFR - Abnormal; Notable for the following components:   Potassium 5.5 (*)    CO2 15 (*)    Glucose, Bld 109 (*)    BUN 33 (*)    Creatinine, Ser 1.64 (*)    Alkaline Phosphatase 152 (*)    GFR, Estimated 42 (*)    All other components within normal limits  I-STAT CG4 LACTIC ACID, ED - Abnormal; Notable for the following components:   Lactic Acid, Venous 2.1 (*)    All other components within normal limits  TROPONIN I (HIGH SENSITIVITY) - Abnormal; Notable for the following components:   Troponin I (High Sensitivity) 23 (*)     All other components within normal limits  TROPONIN I (HIGH SENSITIVITY) - Abnormal; Notable for the following components:   Troponin I (High Sensitivity) 19 (*)    All other components within normal limits  CULTURE, BLOOD (ROUTINE X 2)  CULTURE, BLOOD (ROUTINE X 2)  HCG, SERUM, QUALITATIVE  LIPASE, BLOOD  URINALYSIS, ROUTINE W REFLEX MICROSCOPIC  CBG MONITORING, ED  I-STAT CG4 LACTIC ACID, ED    EKG None  Radiology CT CHEST ABDOMEN PELVIS WO CONTRAST Result Date: 05/30/2023 CLINICAL DATA:  Sudden onset right lower quadrant pain radiating to the right shoulder. EXAM: CT CHEST, ABDOMEN AND PELVIS WITHOUT CONTRAST TECHNIQUE: Multidetector CT imaging of the chest, abdomen and pelvis was performed following the standard protocol without IV contrast. RADIATION DOSE REDUCTION: This exam was performed according to the departmental dose-optimization program which includes automated exposure control, adjustment of the mA and/or kV according to patient size and/or use of iterative reconstruction technique. COMPARISON:  February 14, 2023 FINDINGS: CT CHEST FINDINGS Cardiovascular: No significant vascular findings. Normal heart size. No pericardial effusion. Mediastinum/Nodes: No enlarged mediastinal, hilar, or axillary lymph nodes. Thyroid  gland, trachea, and esophagus demonstrate no significant findings. Lungs/Pleura: Very mild atelectatic changes are seen within the posterior aspect of the bilateral lung bases. There is no evidence of an acute infiltrate, pleural effusion or pneumothorax. Musculoskeletal: No chest wall mass or suspicious bone lesions identified. CT ABDOMEN PELVIS FINDINGS Hepatobiliary: No focal liver abnormality is seen. No gallstones, gallbladder wall thickening, or biliary dilatation. Pancreas: Unremarkable. No pancreatic ductal dilatation or surrounding inflammatory changes. Spleen: Normal in size without focal abnormality. Adrenals/Urinary Tract: Adrenal glands are unremarkable.  Kidneys are normal, without renal calculi, focal lesion, or hydronephrosis. A thin curvilinear like structure is seen within the lumen of the urinary bladder. This represents a new finding when compared to the prior study. Stomach/Bowel: Stomach is within normal limits. The majority of the large bowel is position within the left abdomen, with the cecum and normal appearing appendix noted within the anterior aspect of the abdomen at the level of the umbilicus (axial CT images 74 through 78,  CT series 3). A transition zone is seen within the mid left abdomen. A 6 cm long segment of dilated (approximately 3.8 cm in diameter) distal duodenum is seen (axial CT images 59 through 75, CT series 3). Thickened and inflamed loops of small bowel are seen within the right abdomen. Surgical clips and surgical sutures are seen within the mid and lower right abdomen. This represents a new finding when compared to the prior study. Associated mesenteric inflammatory fat stranding is present. It should be noted that this area is limited in evaluation secondary to lack of intravenous contrast as well as the presence of multiple decompressed small bowel loops. Vascular/Lymphatic: Aortic atherosclerosis. No enlarged abdominal or pelvic lymph nodes. Reproductive: Uterus and bilateral adnexa are unremarkable. Other: No abdominal wall hernia or abnormality. A mild to moderate amount of abdominopelvic ascites is noted. Musculoskeletal: No acute or significant osseous findings. IMPRESSION: 1. Short segment of dilated distal duodenum. While this may be transient in nature, sequelae associated with an early partial small bowel obstruction cannot be excluded. 2. Thickened and inflamed loops of small bowel within the right abdomen, which may represent sequelae associated with an enteritis. 3. Mild to moderate amount of abdominopelvic ascites. 4. Thin curvilinear like structure within the lumen of the urinary bladder which may represent a foreign  body. Further evaluation with urology consult is recommended. 5. Aortic atherosclerosis. Electronically Signed   By: Virgle Grime M.D.   On: 05/30/2023 03:13    Procedures Procedures   Medications Ordered in ED Medications  morphine  (PF) 4 MG/ML injection 4 mg (has no administration in time range)  morphine  (PF) 4 MG/ML injection 4 mg (4 mg Intravenous Given 05/30/23 0029)  ondansetron  (ZOFRAN ) injection 4 mg (4 mg Intravenous Given 05/30/23 0028)  sodium zirconium cyclosilicate  (LOKELMA ) packet 10 g (10 g Oral Given 05/30/23 0238)  albuterol  (VENTOLIN  HFA) 108 (90 Base) MCG/ACT inhaler 2 puff (2 puffs Inhalation Given 05/30/23 0238)  sodium chloride  0.9 % bolus 1,000 mL (0 mLs Intravenous Stopped 05/30/23 0454)  fentaNYL  (SUBLIMAZE ) injection 50 mcg (50 mcg Intravenous Given 05/30/23 0248)    ED Course/ Medical Decision Making/ A&P Clinical Course as of 05/30/23 0615  Mon May 30, 2023  0232 Admitted to [facility] from 5/8 to 5/10 due to anemia, received blood transfusion.  [CG]  [id number] [place] [address] [facility] [id number] [phone] [CG]  [phone] Dr. Susanna Epley [CG]  [phone] [facility] most likely. [CG]  [id number] [phone] (Transfer line) [CG]    Clinical Course User Index [CG] Adel Aden, PA-C   Medical Decision Making Amount and/or Complexity of Data Reviewed Labs: ordered. Radiology: ordered.  Risk Prescription drug management.   33 year old female presents for evaluation.  Please see HPI for further details.  On examination patient is afebrile, nontachycardic.  Her lung sounds are clear bilaterally, not hypoxic.  Abdomen soft and compressible with tenderness noted in the right lower quadrant without rebound or guarding.  No overlying skin change.  No CVA tenderness bilaterally.  Neurological examination at baseline.  Patient recently had kidney pancreas transplant at outside hospital, [facility].  Patient presentation presents long list of  differential diagnoses, these are complex due to patient recent kidney transplant and pancreas transplant.  Will assess with CBC, CMP, troponin x 2, blood cultures, lipase, lactic acid, urinalysis.  Will also assess with CT chest abdomen pelvis without contrast.  Patient initially given 4 mg morphine  for pain, 4 mg Zofran  for nausea.  Patient CBC without leukocytosis,  WBCs 1.7 in the setting of immunosuppression and steroid use.  Patient hemoglobin 11.  Metabolic panel reveals a potassium of 5.5 with slight EKG changes.  Patient was given 2 puffs of albuterol  inhaler, 10 g of Lokelma .  Sodium 135, glucose 109, BUN 33, creatinine 1.64, GFR 42, alk phos 152, anion gap 14.  Patient lactic acid initially elevated at 2.1.  Given 1 L of fluid, lactic acid normalized at this time to 1.4.  Lipase 37.  Troponin flat with initial value 19, delta 23.  CT chest abdomen pelvis revealed a short segment of dilated distal duodenum.  This may be transient nature, sequelae associated with an early partial small bowel obstruction cannot be excluded.  Also noted to have thickened inflamed loops of small bowel within the right abdomen which may represent sequelae associated with an enteritis.  Also noted to have mild to moderate amount of abdominal pelvic ascites.  Additionally, thin curvilinear like structure within the lumen of the urinary bladder which may present a foreign body.  She denies any dysuria.  She states that she is set to have cytoscopy with urology in the next 1 month.  Patient required additional pain medication, 50 mcg of fentanyl .  At this time, patient will require transfer back to [facility] due to recent transplant.  Have discussed with transfer service.  They advised me that Dr. Acey Holding will be the accepting physician.  The patient will remain here in the department until [facility] or [facility] is available for transfer.  The patient is aware of the plan.  She is amenable to the plan.  Final Clinical  Impression(s) / ED Diagnoses Final diagnoses:  Right lower quadrant abdominal pain  Enteritis    Rx / DC Orders ED Discharge Orders     None         Kristin Peyer 05/30/23 0615    Long, Joshua G, MD 05/30/23 [phone]

## 2023-05-30 NOTE — ED Notes (Signed)
 Pt unable to void on bedpan and bsc at this time d/t pain, pt will attempt to void again after pain medication.

## 2023-05-30 NOTE — ED Notes (Signed)
 [facility] called by RN

## 2023-05-30 NOTE — ED Notes (Signed)
"  Barbara Donovan" from [facility] stated that they can't transport pt until [facility]hift

## 2023-05-31 DIAGNOSIS — Z940 Kidney transplant status: Secondary | ICD-10-CM | POA: Diagnosis not present

## 2023-05-31 DIAGNOSIS — G8918 Other acute postprocedural pain: Secondary | ICD-10-CM | POA: Diagnosis not present

## 2023-05-31 DIAGNOSIS — K631 Perforation of intestine (nontraumatic): Secondary | ICD-10-CM | POA: Diagnosis not present

## 2023-05-31 DIAGNOSIS — Z980 Intestinal bypass and anastomosis status: Secondary | ICD-10-CM | POA: Diagnosis not present

## 2023-05-31 DIAGNOSIS — R188 Other ascites: Secondary | ICD-10-CM | POA: Diagnosis not present

## 2023-05-31 DIAGNOSIS — Z9483 Pancreas transplant status: Secondary | ICD-10-CM | POA: Diagnosis not present

## 2023-06-03 DIAGNOSIS — Z9483 Pancreas transplant status: Secondary | ICD-10-CM | POA: Diagnosis not present

## 2023-06-03 DIAGNOSIS — Z792 Long term (current) use of antibiotics: Secondary | ICD-10-CM | POA: Diagnosis not present

## 2023-06-03 DIAGNOSIS — B379 Candidiasis, unspecified: Secondary | ICD-10-CM | POA: Diagnosis not present

## 2023-06-03 DIAGNOSIS — R1031 Right lower quadrant pain: Secondary | ICD-10-CM | POA: Diagnosis not present

## 2023-06-03 DIAGNOSIS — Z940 Kidney transplant status: Secondary | ICD-10-CM | POA: Diagnosis not present

## 2023-06-04 LAB — CULTURE, BLOOD (ROUTINE X 2)
Culture: NO GROWTH
Culture: NO GROWTH
Special Requests: ADEQUATE
Special Requests: ADEQUATE

## 2023-06-06 DIAGNOSIS — Z940 Kidney transplant status: Secondary | ICD-10-CM | POA: Diagnosis not present

## 2023-06-06 DIAGNOSIS — Z9483 Pancreas transplant status: Secondary | ICD-10-CM | POA: Diagnosis not present

## 2023-06-10 DIAGNOSIS — Z792 Long term (current) use of antibiotics: Secondary | ICD-10-CM | POA: Diagnosis not present

## 2023-06-10 DIAGNOSIS — Z940 Kidney transplant status: Secondary | ICD-10-CM | POA: Diagnosis not present

## 2023-06-10 DIAGNOSIS — Z79624 Long term (current) use of inhibitors of nucleotide synthesis: Secondary | ICD-10-CM | POA: Diagnosis not present

## 2023-06-10 DIAGNOSIS — T86899 Unspecified complication of other transplanted tissue: Secondary | ICD-10-CM | POA: Diagnosis not present

## 2023-06-10 DIAGNOSIS — Z960 Presence of urogenital implants: Secondary | ICD-10-CM | POA: Diagnosis not present

## 2023-06-10 DIAGNOSIS — D84821 Immunodeficiency due to drugs: Secondary | ICD-10-CM | POA: Diagnosis not present

## 2023-06-10 DIAGNOSIS — Z48298 Encounter for aftercare following other organ transplant: Secondary | ICD-10-CM | POA: Diagnosis not present

## 2023-06-10 DIAGNOSIS — Z9189 Other specified personal risk factors, not elsewhere classified: Secondary | ICD-10-CM | POA: Diagnosis not present

## 2023-06-10 DIAGNOSIS — I10 Essential (primary) hypertension: Secondary | ICD-10-CM | POA: Diagnosis not present

## 2023-06-10 DIAGNOSIS — D849 Immunodeficiency, unspecified: Secondary | ICD-10-CM | POA: Diagnosis not present

## 2023-06-10 DIAGNOSIS — R739 Hyperglycemia, unspecified: Secondary | ICD-10-CM | POA: Diagnosis not present

## 2023-06-10 DIAGNOSIS — K659 Peritonitis, unspecified: Secondary | ICD-10-CM | POA: Diagnosis not present

## 2023-06-10 DIAGNOSIS — G8918 Other acute postprocedural pain: Secondary | ICD-10-CM | POA: Diagnosis not present

## 2023-06-10 DIAGNOSIS — Z9483 Pancreas transplant status: Secondary | ICD-10-CM | POA: Diagnosis not present

## 2023-06-10 DIAGNOSIS — Z4822 Encounter for aftercare following kidney transplant: Secondary | ICD-10-CM | POA: Diagnosis not present

## 2023-06-10 DIAGNOSIS — Z5181 Encounter for therapeutic drug level monitoring: Secondary | ICD-10-CM | POA: Diagnosis not present

## 2023-06-10 DIAGNOSIS — Z79621 Long term (current) use of calcineurin inhibitor: Secondary | ICD-10-CM | POA: Diagnosis not present

## 2023-06-10 DIAGNOSIS — R5381 Other malaise: Secondary | ICD-10-CM | POA: Diagnosis not present

## 2023-06-10 DIAGNOSIS — K631 Perforation of intestine (nontraumatic): Secondary | ICD-10-CM | POA: Diagnosis not present

## 2023-06-12 ENCOUNTER — Other Ambulatory Visit: Payer: Self-pay | Admitting: Internal Medicine

## 2023-06-12 DIAGNOSIS — E10319 Type 1 diabetes mellitus with unspecified diabetic retinopathy without macular edema: Secondary | ICD-10-CM

## 2023-06-17 DIAGNOSIS — G8918 Other acute postprocedural pain: Secondary | ICD-10-CM | POA: Diagnosis not present

## 2023-06-17 DIAGNOSIS — Z1159 Encounter for screening for other viral diseases: Secondary | ICD-10-CM | POA: Diagnosis not present

## 2023-06-17 DIAGNOSIS — Z9483 Pancreas transplant status: Secondary | ICD-10-CM | POA: Diagnosis not present

## 2023-06-17 DIAGNOSIS — K659 Peritonitis, unspecified: Secondary | ICD-10-CM | POA: Diagnosis not present

## 2023-06-17 DIAGNOSIS — Z466 Encounter for fitting and adjustment of urinary device: Secondary | ICD-10-CM | POA: Diagnosis not present

## 2023-06-17 DIAGNOSIS — Z960 Presence of urogenital implants: Secondary | ICD-10-CM | POA: Diagnosis not present

## 2023-06-17 DIAGNOSIS — Z792 Long term (current) use of antibiotics: Secondary | ICD-10-CM | POA: Diagnosis not present

## 2023-06-17 DIAGNOSIS — Z940 Kidney transplant status: Secondary | ICD-10-CM | POA: Diagnosis not present

## 2023-06-17 DIAGNOSIS — I10 Essential (primary) hypertension: Secondary | ICD-10-CM | POA: Diagnosis not present

## 2023-06-17 DIAGNOSIS — K631 Perforation of intestine (nontraumatic): Secondary | ICD-10-CM | POA: Diagnosis not present

## 2023-06-17 DIAGNOSIS — T86899 Unspecified complication of other transplanted tissue: Secondary | ICD-10-CM | POA: Diagnosis not present

## 2023-06-17 DIAGNOSIS — D849 Immunodeficiency, unspecified: Secondary | ICD-10-CM | POA: Diagnosis not present

## 2023-06-24 DIAGNOSIS — D849 Immunodeficiency, unspecified: Secondary | ICD-10-CM | POA: Diagnosis not present

## 2023-06-24 DIAGNOSIS — Z79624 Long term (current) use of inhibitors of nucleotide synthesis: Secondary | ICD-10-CM | POA: Diagnosis not present

## 2023-06-24 DIAGNOSIS — E875 Hyperkalemia: Secondary | ICD-10-CM | POA: Diagnosis not present

## 2023-06-24 DIAGNOSIS — R748 Abnormal levels of other serum enzymes: Secondary | ICD-10-CM | POA: Diagnosis not present

## 2023-06-24 DIAGNOSIS — G8918 Other acute postprocedural pain: Secondary | ICD-10-CM | POA: Diagnosis not present

## 2023-06-24 DIAGNOSIS — D500 Iron deficiency anemia secondary to blood loss (chronic): Secondary | ICD-10-CM | POA: Diagnosis not present

## 2023-06-24 DIAGNOSIS — D84821 Immunodeficiency due to drugs: Secondary | ICD-10-CM | POA: Diagnosis not present

## 2023-06-24 DIAGNOSIS — E213 Hyperparathyroidism, unspecified: Secondary | ICD-10-CM | POA: Diagnosis not present

## 2023-06-24 DIAGNOSIS — Z940 Kidney transplant status: Secondary | ICD-10-CM | POA: Diagnosis not present

## 2023-06-24 DIAGNOSIS — Z79899 Other long term (current) drug therapy: Secondary | ICD-10-CM | POA: Diagnosis not present

## 2023-06-24 DIAGNOSIS — I10 Essential (primary) hypertension: Secondary | ICD-10-CM | POA: Diagnosis not present

## 2023-06-24 DIAGNOSIS — Z9483 Pancreas transplant status: Secondary | ICD-10-CM | POA: Diagnosis not present

## 2023-06-24 DIAGNOSIS — E7800 Pure hypercholesterolemia, unspecified: Secondary | ICD-10-CM | POA: Diagnosis not present

## 2023-06-27 DIAGNOSIS — Z9483 Pancreas transplant status: Secondary | ICD-10-CM | POA: Diagnosis not present

## 2023-06-27 DIAGNOSIS — D849 Immunodeficiency, unspecified: Secondary | ICD-10-CM | POA: Diagnosis not present

## 2023-06-27 DIAGNOSIS — Z940 Kidney transplant status: Secondary | ICD-10-CM | POA: Diagnosis not present

## 2023-06-27 DIAGNOSIS — Z5181 Encounter for therapeutic drug level monitoring: Secondary | ICD-10-CM | POA: Diagnosis not present

## 2023-06-28 ENCOUNTER — Ambulatory Visit: Payer: Medicare (Managed Care) | Admitting: Internal Medicine

## 2023-07-06 ENCOUNTER — Encounter: Payer: Self-pay | Admitting: Internal Medicine

## 2023-07-06 ENCOUNTER — Ambulatory Visit (INDEPENDENT_AMBULATORY_CARE_PROVIDER_SITE_OTHER): Payer: Medicare (Managed Care) | Admitting: Internal Medicine

## 2023-07-06 VITALS — BP 104/66 | HR 77 | Ht 66.0 in | Wt 162.0 lb

## 2023-07-06 DIAGNOSIS — E109 Type 1 diabetes mellitus without complications: Secondary | ICD-10-CM | POA: Diagnosis not present

## 2023-07-06 DIAGNOSIS — Z9483 Pancreas transplant status: Secondary | ICD-10-CM

## 2023-07-06 DIAGNOSIS — Z940 Kidney transplant status: Secondary | ICD-10-CM

## 2023-07-06 LAB — POCT GLYCOSYLATED HEMOGLOBIN (HGB A1C): Hemoglobin A1C: 5.3 % (ref 4.0–5.6)

## 2023-07-06 LAB — POCT GLUCOSE (DEVICE FOR HOME USE): POC Glucose: 128 mg/dL — AB (ref 70–99)

## 2023-07-06 MED ORDER — DEXCOM G7 SENSOR MISC: 1.0000 | 3 refills | Status: AC

## 2023-07-06 MED ORDER — DEXCOM G7 SENSOR MISC: 1.0000 | 3 refills | Status: DC
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
## 2023-07-08 DIAGNOSIS — Z48298 Encounter for aftercare following other organ transplant: Secondary | ICD-10-CM | POA: Diagnosis not present

## 2023-07-08 DIAGNOSIS — T86899 Unspecified complication of other transplanted tissue: Secondary | ICD-10-CM | POA: Diagnosis not present

## 2023-07-08 DIAGNOSIS — I151 Hypertension secondary to other renal disorders: Secondary | ICD-10-CM | POA: Diagnosis not present

## 2023-07-08 DIAGNOSIS — D849 Immunodeficiency, unspecified: Secondary | ICD-10-CM | POA: Diagnosis not present

## 2023-07-08 DIAGNOSIS — K631 Perforation of intestine (nontraumatic): Secondary | ICD-10-CM | POA: Diagnosis not present

## 2023-07-08 DIAGNOSIS — Z940 Kidney transplant status: Secondary | ICD-10-CM | POA: Diagnosis not present

## 2023-07-08 DIAGNOSIS — D528 Other folate deficiency anemias: Secondary | ICD-10-CM | POA: Diagnosis not present

## 2023-07-14 ENCOUNTER — Other Ambulatory Visit (HOSPITAL_COMMUNITY): Payer: Self-pay

## 2023-07-14 ENCOUNTER — Telehealth: Payer: Self-pay

## 2023-07-14 DIAGNOSIS — Z5181 Encounter for therapeutic drug level monitoring: Secondary | ICD-10-CM | POA: Diagnosis not present

## 2023-07-14 DIAGNOSIS — D849 Immunodeficiency, unspecified: Secondary | ICD-10-CM | POA: Diagnosis not present

## 2023-07-14 DIAGNOSIS — Z940 Kidney transplant status: Secondary | ICD-10-CM | POA: Diagnosis not present

## 2023-07-14 DIAGNOSIS — Z9483 Pancreas transplant status: Secondary | ICD-10-CM | POA: Diagnosis not present

## 2023-07-14 NOTE — Telephone Encounter (Signed)
 Pharmacy Patient Advocate Encounter   Received notification from CoverMyMeds that prior authorization for dexcom g7 sensor is required/requested.   Insurance verification completed.   The patient is insured through [facility] .   Per test claim: PA required; PA submitted to above mentioned insurance via CoverMyMeds Key/confirmation #/EOC [facility] Status is pending

## 2023-07-18 NOTE — Telephone Encounter (Signed)
 Pharmacy Patient Advocate Encounter  Received notification from [employer] that Prior Authorization for Dexcom G7 sensor has been APPROVED from 07/14/23 to 01/18/24   PA #/Case ID/Reference #: [id number]

## 2023-07-21 DIAGNOSIS — Z7952 Long term (current) use of systemic steroids: Secondary | ICD-10-CM | POA: Diagnosis not present

## 2023-07-21 DIAGNOSIS — Z9189 Other specified personal risk factors, not elsewhere classified: Secondary | ICD-10-CM | POA: Diagnosis not present

## 2023-07-21 DIAGNOSIS — D84821 Immunodeficiency due to drugs: Secondary | ICD-10-CM | POA: Diagnosis not present

## 2023-07-21 DIAGNOSIS — Z48298 Encounter for aftercare following other organ transplant: Secondary | ICD-10-CM | POA: Diagnosis not present

## 2023-07-21 DIAGNOSIS — Z79624 Long term (current) use of inhibitors of nucleotide synthesis: Secondary | ICD-10-CM | POA: Diagnosis not present

## 2023-07-21 DIAGNOSIS — R220 Localized swelling, mass and lump, head: Secondary | ICD-10-CM | POA: Diagnosis not present

## 2023-07-21 DIAGNOSIS — Z5181 Encounter for therapeutic drug level monitoring: Secondary | ICD-10-CM | POA: Diagnosis not present

## 2023-07-21 DIAGNOSIS — E875 Hyperkalemia: Secondary | ICD-10-CM | POA: Diagnosis not present

## 2023-07-21 DIAGNOSIS — D849 Immunodeficiency, unspecified: Secondary | ICD-10-CM | POA: Diagnosis not present

## 2023-07-21 DIAGNOSIS — Z940 Kidney transplant status: Secondary | ICD-10-CM | POA: Diagnosis not present

## 2023-07-21 DIAGNOSIS — Z79899 Other long term (current) drug therapy: Secondary | ICD-10-CM | POA: Diagnosis not present

## 2023-07-21 DIAGNOSIS — Z4822 Encounter for aftercare following kidney transplant: Secondary | ICD-10-CM | POA: Diagnosis not present

## 2023-07-21 DIAGNOSIS — Z9483 Pancreas transplant status: Secondary | ICD-10-CM | POA: Diagnosis not present

## 2023-07-21 DIAGNOSIS — Z794 Long term (current) use of insulin: Secondary | ICD-10-CM | POA: Diagnosis not present

## 2023-07-21 DIAGNOSIS — R748 Abnormal levels of other serum enzymes: Secondary | ICD-10-CM | POA: Diagnosis not present

## 2023-07-21 DIAGNOSIS — Z792 Long term (current) use of antibiotics: Secondary | ICD-10-CM | POA: Diagnosis not present

## 2023-07-21 DIAGNOSIS — I10 Essential (primary) hypertension: Secondary | ICD-10-CM | POA: Diagnosis not present

## 2023-07-21 DIAGNOSIS — Z79621 Long term (current) use of calcineurin inhibitor: Secondary | ICD-10-CM | POA: Diagnosis not present

## 2023-07-21 DIAGNOSIS — E1022 Type 1 diabetes mellitus with diabetic chronic kidney disease: Secondary | ICD-10-CM | POA: Diagnosis not present

## 2023-07-28 DIAGNOSIS — Z9483 Pancreas transplant status: Secondary | ICD-10-CM | POA: Diagnosis not present

## 2023-07-28 DIAGNOSIS — Z940 Kidney transplant status: Secondary | ICD-10-CM | POA: Diagnosis not present

## 2023-07-28 DIAGNOSIS — D849 Immunodeficiency, unspecified: Secondary | ICD-10-CM | POA: Diagnosis not present

## 2023-07-28 DIAGNOSIS — Z5181 Encounter for therapeutic drug level monitoring: Secondary | ICD-10-CM | POA: Diagnosis not present

## 2023-08-05 DIAGNOSIS — Z940 Kidney transplant status: Secondary | ICD-10-CM | POA: Diagnosis not present

## 2023-08-05 DIAGNOSIS — Z5181 Encounter for therapeutic drug level monitoring: Secondary | ICD-10-CM | POA: Diagnosis not present

## 2023-08-05 DIAGNOSIS — Z9483 Pancreas transplant status: Secondary | ICD-10-CM | POA: Diagnosis not present

## 2023-08-05 DIAGNOSIS — D849 Immunodeficiency, unspecified: Secondary | ICD-10-CM | POA: Diagnosis not present

## 2023-08-08 DIAGNOSIS — H6991 Unspecified Eustachian tube disorder, right ear: Secondary | ICD-10-CM | POA: Diagnosis not present

## 2023-08-08 DIAGNOSIS — H6123 Impacted cerumen, bilateral: Secondary | ICD-10-CM | POA: Diagnosis not present

## 2023-08-08 DIAGNOSIS — H6591 Unspecified nonsuppurative otitis media, right ear: Secondary | ICD-10-CM | POA: Diagnosis not present

## 2023-08-11 ENCOUNTER — Ambulatory Visit: Admitting: Student

## 2023-08-11 VITALS — BP 132/81 | HR 72 | Temp 98.1°F | Wt 166.0 lb

## 2023-08-11 DIAGNOSIS — H93231 Hyperacusis, right ear: Secondary | ICD-10-CM

## 2023-08-11 MED ORDER — AMOXICILLIN-POT CLAVULANATE 875-125 MG PO TABS: 1.0000 | ORAL_TABLET | Freq: Two times a day (BID) | ORAL | 0 refills | Status: AC

## 2023-08-11 NOTE — Patient Instructions (Addendum)
 Thank you so much for coming to the clinic today!   I am sending in an antibiotic, please take it twice a day for 5 days total. Also, please reach out to that urgent care to check status on that ENT referral   If you have any questions please feel free to the call the clinic at anytime at [phone]. It was a pleasure seeing you!  Best, Dr. Milda Lindvall

## 2023-08-11 NOTE — Assessment & Plan Note (Addendum)
 Pt presents for an acute visit regarding right sided hearing changes. Her symptoms started late last week, where she noticed hearing out of her right ear sounded muffled. She does not have any tenderness in the area, no fevers, chills, sinus tenderness, no trauma, no recent swimming, no recent air travel or high elevation. She went to urgent care and was given a decongestant which has not been helping her symptoms. She was also given a referral to ENT, which she hasn't heard back from yet.   On my exam, there is an effusion present, with some bulging of her tympanic membrane. Given these findings, as well as the acute nature of these symptoms. Will treat for otitis media with augmentin , short course. Recommended she follow up with ENT referral, and discuss with her transplant team about safety of augmentin  with her cellcept and her tacrolimus , which she is agreeable for.   Plan:  - Trial Augmentin  BID for 5 days  - Pt will follow up with ENT

## 2023-08-11 NOTE — Progress Notes (Signed)
 CC: Right sided muffled hearing  HPI:  [name] is a 33 y.o. female living with a history stated below and presents today for right sided muffled hearing. Please see problem based assessment and plan for additional details.  Past Medical History:  Diagnosis Date   Anxiety    Asthma    as a child, no problems as an adult, no inhaler   Cataract    NS OU   Chronic hypertension during pregnancy, antepartum 08/19/2017   Dehydration 01/28/2018   Depression    Depression during pregnancy, antepartum 07/07/2017   6/20: Short trial of zoloft  previously, reports didn't help much but also didn't give it a chance Discussed r/b/a SSRIs in pregnancy, agrees to try Zoloft  again, rx sent No SI/HI/red flags   Diabetes (HCC)    TYPE I. 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   ESRD on peritoneal dialysis (HCC)    HTN (hypertension)    Hypertensive retinopathy    OU   Hypokalemia 01/22/2018   Hypomagnesemia 01/28/2018   Intractable nausea and vomiting 01/22/2018   Intrauterine growth restriction (IUGR) affecting care of mother 12/22/2017   Lung nodule seen on imaging study    Recommended follow-up 3 to 6 months (May - August 2025) for repeat chest CT.   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 A2/B GDM)  Required Referrals for A1GDM or A2GDM: [x]  Diabetes Education and Testing Supplies [x]  Nutrition Cousult  For A2/B GDM or higher classes of DM [x]  Diabetes Education and Testing Supplies [x]  Nutrition Counsult [x]  Fetal ECHO after 20 weeks  [x]  Eye exam for retina evaluation - severe retinopathy 7/19  Base   Ventricular septal defect (VSD) of fetus in  singleton pregnancy, antepartum 09/30/2017   May go to newborn nursery per Dr. Lajuanda Echo prior to discharge    Current Outpatient Medications on File Prior to Visit  Medication Sig Dispense Refill   acetaminophen  (TYLENOL ) 325 MG tablet Take 650 mg by mouth every 6 (six) hours as needed for mild pain.     acyclovir (ZOVIRAX) 400 MG tablet Take 400 mg by mouth 2 (two) times daily.     amLODipine  (NORVASC ) 10 MG tablet Take 1 tablet (10 mg total) by mouth daily. (Patient not taking: Reported on 07/06/2023) 90 tablet 1   amLODipine  (NORVASC ) 5 MG tablet Take 5 mg by mouth daily.     aspirin  EC 81 MG tablet Take 81 mg by mouth daily.     calcitRIOL  (ROCALTROL ) 0.5 MCG capsule Take 0.5 mcg by mouth in the morning, at noon, and at bedtime. (Patient not taking: Reported on 07/06/2023)     carvedilol  (COREG ) 25 MG tablet Take 1 tablet (25 mg total) by mouth 2 (two) times daily. 180 tablet 3   Continuous Glucose Sensor (DEXCOM G7 SENSOR) MISC 1 Device by Does not apply route as directed. 9 each 3   famotidine  (PEPCID ) 20 MG tablet Take 1 tablet by mouth 2 (two) times daily.     folic acid  (FOLVITE ) 1 MG tablet Take 1 mg by mouth daily.     insulin  degludec (TRESIBA  FLEXTOUCH) 200 UNIT/ML FlexTouch Pen Inject 14 Units into the skin daily  in the afternoon. 15 mL 3   insulin  lispro (HUMALOG  KWIKPEN) 100 UNIT/ML KwikPen Max daily 30 units 30 mL 4   mirtazapine  (REMERON ) 15 MG tablet Take 1 tablet (15 mg total) by mouth at bedtime. Do not take medicine in the morning and drive. 90 tablet 3   mycophenolate (CELLCEPT) 250 MG capsule Take 1,000 mg by mouth 2 (two) times daily.     oxyCODONE  (OXY IR/ROXICODONE ) 5 MG immediate release tablet Take by mouth.     pantoprazole  (PROTONIX ) 40 MG tablet Take 40 mg by mouth 2 (two) times daily.     predniSONE (DELTASONE) 5 MG tablet Take 20 mg by mouth daily.     senna-docusate (SENOKOT-S) 8.6-50 MG tablet Take 2 tablets by mouth 2 (two) times daily as needed for moderate  constipation or mild constipation.     sodium bicarbonate  650 MG tablet Take 1,300 mg by mouth 2 (two) times daily. (Patient not taking: Reported on 07/06/2023)     sulfamethoxazole-trimethoprim (BACTRIM DS) 800-160 MG tablet Take 1 tablet by mouth every Monday, Wednesday, and Friday.     tacrolimus  (PROGRAF ) 1 MG capsule Take 7 mg by mouth 2 (two) times daily.     No current facility-administered medications on file prior to visit.    Family History  Problem Relation Age of Onset   Diabetes Mother    Aneurysm Mother 78   Seizures Mother    Diabetes Father    Cataracts Father    COPD Father    Heart attack Father    Heart disease Father    Healthy Sister    Healthy Daughter    Stroke Maternal Grandfather    Amblyopia Neg Hx    Blindness Neg Hx    Glaucoma Neg Hx    Macular degeneration Neg Hx    Retinal detachment Neg Hx    Strabismus Neg Hx    Retinitis pigmentosa Neg Hx    Colon cancer Neg Hx    Stomach cancer Neg Hx    Esophageal cancer Neg Hx    Pancreatic cancer Neg Hx    Liver disease Neg Hx     Social History   Socioeconomic History   Marital status: Significant Other    Spouse name: Not on file   Number of children: Not on file   Years of education: Not on file   Highest education level: Not on file  Occupational History   Not on file  Tobacco Use   Smoking status: Never    Passive exposure: Never   Smokeless tobacco: Never  Vaping Use   Vaping status: Never Used  Substance and Sexual Activity   Alcohol use: Not Currently   Drug use: Never   Sexual activity: Not Currently    Birth control/protection: None  Other Topics Concern   Not on file  Social History Narrative   Not on file   Social Drivers of Health   Financial Resource Strain: Medium Risk (08/11/2023)   Overall Financial Resource Strain (CARDIA)    Difficulty of Paying Living Expenses: Somewhat hard  Food Insecurity: Food Insecurity Present (08/11/2023)   Hunger Vital Sign    Worried  About Running Out of Food in the Last Year: Sometimes true    Ran Out of Food in the Last Year: Never true  Transportation Needs: No Transportation Needs (08/11/2023)   PRAPARE - Administrator, Civil Service (Medical): No    Lack of Transportation (Non-Medical): No  Physical Activity: Sufficiently Active (  08/11/2023)   Exercise Vital Sign    Days of Exercise per Week: 3 days    Minutes of Exercise per Session: 50 min  Stress: Stress Concern Present (08/11/2023)   [employer] of Occupational Health - Occupational Stress Questionnaire    Feeling of Stress: To some extent  Social Connections: Moderately Isolated (08/11/2023)   Social Connection and Isolation Panel    Frequency of Communication with Friends and Family: Once a week    Frequency of Social Gatherings with Friends and Family: Never    Attends Religious Services: More than 4 times per year    Active Member of [employer] or Organizations: Yes    Attends Engineer, structural: More than 4 times per year    Marital Status: Never married  Intimate Partner Violence: Not At Risk (08/11/2023)   Humiliation, Afraid, Rape, and Kick questionnaire    Fear of Current or Ex-Partner: No    Emotionally Abused: No    Physically Abused: No    Sexually Abused: No    Review of Systems: ROS negative except for what is noted on the assessment and plan.  Vitals:   08/11/23 1307  BP: 132/81  Pulse: 72  Temp: 98.1 F (36.7 C)  TempSrc: Oral  SpO2: 99%  Weight: 166 lb (75.3 kg)    Physical Exam: Constitutional: well-appearing female  in no acute distress HENT: normocephalic atraumatic, mucous membranes moist Ears: Right sided ear effusion with bulging of tympanic membrane, non-tender, no drainage seen Eyes: conjunctiva non-erythematous Neck: supple Cardiovascular: regular rate and rhythm, no m/r/g Pulmonary/Chest: normal work of breathing on room air, lungs clear to auscultation bilaterally   Assessment & Plan:    Hearing abnormally acute, right Pt presents for an acute visit regarding right sided hearing changes. Her symptoms started late last week, where she noticed hearing out of her right ear sounded muffled. She does not have any tenderness in the area, no fevers, chills, sinus tenderness, no trauma, no recent swimming, no recent air travel or high elevation. She went to urgent care and was given a decongestant which has not been helping her symptoms. She was also given a referral to ENT, which she hasn't heard back from yet.   On my exam, there is an effusion present, with some bulging of her tympanic membrane. Given these findings, as well as the acute nature of these symptoms. Will treat for otitis media with augmentin , short course. Recommended she follow up with ENT referral, and discuss with her transplant team about safety of augmentin  with her cellcept and her tacrolimus , which she is agreeable for.   Plan:  - Trial Augmentin  BID for 5 days  - Pt will follow up with ENT   Patient seen with Dr. Francesco Roetta Chars, M.D. [facility] Internal Medicine, PGY-3 Pager: [phone] Date 08/11/2023 Time 3:42 PM

## 2023-08-12 DIAGNOSIS — Z48288 Encounter for aftercare following multiple organ transplant: Secondary | ICD-10-CM | POA: Diagnosis not present

## 2023-08-12 DIAGNOSIS — R748 Abnormal levels of other serum enzymes: Secondary | ICD-10-CM | POA: Diagnosis not present

## 2023-08-12 DIAGNOSIS — Z940 Kidney transplant status: Secondary | ICD-10-CM | POA: Diagnosis not present

## 2023-08-12 DIAGNOSIS — Z9483 Pancreas transplant status: Secondary | ICD-10-CM | POA: Diagnosis not present

## 2023-08-12 DIAGNOSIS — B348 Other viral infections of unspecified site: Secondary | ICD-10-CM | POA: Diagnosis not present

## 2023-08-12 DIAGNOSIS — Z5181 Encounter for therapeutic drug level monitoring: Secondary | ICD-10-CM | POA: Diagnosis not present

## 2023-08-12 DIAGNOSIS — L02211 Cutaneous abscess of abdominal wall: Secondary | ICD-10-CM | POA: Diagnosis not present

## 2023-08-12 DIAGNOSIS — D849 Immunodeficiency, unspecified: Secondary | ICD-10-CM | POA: Diagnosis not present

## 2023-08-12 DIAGNOSIS — Z79899 Other long term (current) drug therapy: Secondary | ICD-10-CM | POA: Diagnosis not present

## 2023-08-12 DIAGNOSIS — H6691 Otitis media, unspecified, right ear: Secondary | ICD-10-CM | POA: Diagnosis not present

## 2023-08-12 DIAGNOSIS — I10 Essential (primary) hypertension: Secondary | ICD-10-CM | POA: Diagnosis not present

## 2023-08-12 DIAGNOSIS — Z792 Long term (current) use of antibiotics: Secondary | ICD-10-CM | POA: Diagnosis not present

## 2023-08-12 DIAGNOSIS — Z79624 Long term (current) use of inhibitors of nucleotide synthesis: Secondary | ICD-10-CM | POA: Diagnosis not present

## 2023-08-12 DIAGNOSIS — Z7952 Long term (current) use of systemic steroids: Secondary | ICD-10-CM | POA: Diagnosis not present

## 2023-08-12 DIAGNOSIS — E875 Hyperkalemia: Secondary | ICD-10-CM | POA: Diagnosis not present

## 2023-08-12 DIAGNOSIS — D84821 Immunodeficiency due to drugs: Secondary | ICD-10-CM | POA: Diagnosis not present

## 2023-08-12 NOTE — Progress Notes (Signed)
 Internal Medicine Clinic Attending  I was physically present during the key portions of the resident provided service and participated in the medical decision making of patient's management care. I reviewed pertinent patient test results.  The assessment, diagnosis, and plan were formulated together and I agree with the documentation in the resident's note.  Cherylene Corrente, MD

## 2023-08-15 DIAGNOSIS — D849 Immunodeficiency, unspecified: Secondary | ICD-10-CM | POA: Diagnosis not present

## 2023-08-15 DIAGNOSIS — Z5181 Encounter for therapeutic drug level monitoring: Secondary | ICD-10-CM | POA: Diagnosis not present

## 2023-08-15 DIAGNOSIS — H6501 Acute serous otitis media, right ear: Secondary | ICD-10-CM | POA: Diagnosis not present

## 2023-08-15 DIAGNOSIS — Z9483 Pancreas transplant status: Secondary | ICD-10-CM | POA: Diagnosis not present

## 2023-08-15 DIAGNOSIS — H9190 Unspecified hearing loss, unspecified ear: Secondary | ICD-10-CM | POA: Diagnosis not present

## 2023-08-15 DIAGNOSIS — Z940 Kidney transplant status: Secondary | ICD-10-CM | POA: Diagnosis not present

## 2023-08-19 DIAGNOSIS — Z940 Kidney transplant status: Secondary | ICD-10-CM | POA: Diagnosis not present

## 2023-08-19 DIAGNOSIS — T8149XA Infection following a procedure, other surgical site, initial encounter: Secondary | ICD-10-CM | POA: Diagnosis not present

## 2023-08-19 DIAGNOSIS — D849 Immunodeficiency, unspecified: Secondary | ICD-10-CM | POA: Diagnosis not present

## 2023-08-19 DIAGNOSIS — Z9483 Pancreas transplant status: Secondary | ICD-10-CM | POA: Diagnosis not present

## 2023-08-19 DIAGNOSIS — E109 Type 1 diabetes mellitus without complications: Secondary | ICD-10-CM | POA: Diagnosis not present

## 2023-08-19 DIAGNOSIS — E10319 Type 1 diabetes mellitus with unspecified diabetic retinopathy without macular edema: Secondary | ICD-10-CM | POA: Diagnosis not present

## 2023-08-19 DIAGNOSIS — S31109A Unspecified open wound of abdominal wall, unspecified quadrant without penetration into peritoneal cavity, initial encounter: Secondary | ICD-10-CM | POA: Diagnosis not present

## 2023-08-19 DIAGNOSIS — I10 Essential (primary) hypertension: Secondary | ICD-10-CM | POA: Diagnosis not present

## 2023-08-19 DIAGNOSIS — Z9889 Other specified postprocedural states: Secondary | ICD-10-CM | POA: Diagnosis not present

## 2023-08-19 DIAGNOSIS — J45909 Unspecified asthma, uncomplicated: Secondary | ICD-10-CM | POA: Diagnosis not present

## 2023-08-19 DIAGNOSIS — D84821 Immunodeficiency due to drugs: Secondary | ICD-10-CM | POA: Diagnosis not present

## 2023-08-19 DIAGNOSIS — Z48288 Encounter for aftercare following multiple organ transplant: Secondary | ICD-10-CM | POA: Diagnosis not present

## 2023-08-23 DIAGNOSIS — Z940 Kidney transplant status: Secondary | ICD-10-CM | POA: Diagnosis not present

## 2023-08-23 DIAGNOSIS — Z5181 Encounter for therapeutic drug level monitoring: Secondary | ICD-10-CM | POA: Diagnosis not present

## 2023-08-23 DIAGNOSIS — Z9483 Pancreas transplant status: Secondary | ICD-10-CM | POA: Diagnosis not present

## 2023-08-23 DIAGNOSIS — D849 Immunodeficiency, unspecified: Secondary | ICD-10-CM | POA: Diagnosis not present

## 2023-09-09 DIAGNOSIS — D84821 Immunodeficiency due to drugs: Secondary | ICD-10-CM | POA: Diagnosis not present

## 2023-09-09 DIAGNOSIS — I10 Essential (primary) hypertension: Secondary | ICD-10-CM | POA: Diagnosis not present

## 2023-09-09 DIAGNOSIS — E872 Acidosis, unspecified: Secondary | ICD-10-CM | POA: Diagnosis not present

## 2023-09-09 DIAGNOSIS — B348 Other viral infections of unspecified site: Secondary | ICD-10-CM | POA: Diagnosis not present

## 2023-09-09 DIAGNOSIS — Z940 Kidney transplant status: Secondary | ICD-10-CM | POA: Diagnosis not present

## 2023-09-09 DIAGNOSIS — Z792 Long term (current) use of antibiotics: Secondary | ICD-10-CM | POA: Diagnosis not present

## 2023-09-09 DIAGNOSIS — D849 Immunodeficiency, unspecified: Secondary | ICD-10-CM | POA: Diagnosis not present

## 2023-09-09 DIAGNOSIS — Z48288 Encounter for aftercare following multiple organ transplant: Secondary | ICD-10-CM | POA: Diagnosis not present

## 2023-09-09 DIAGNOSIS — Z9483 Pancreas transplant status: Secondary | ICD-10-CM | POA: Diagnosis not present

## 2023-09-09 DIAGNOSIS — Z48298 Encounter for aftercare following other organ transplant: Secondary | ICD-10-CM | POA: Diagnosis not present

## 2023-09-09 DIAGNOSIS — Z79621 Long term (current) use of calcineurin inhibitor: Secondary | ICD-10-CM | POA: Diagnosis not present

## 2023-09-22 DIAGNOSIS — Z5181 Encounter for therapeutic drug level monitoring: Secondary | ICD-10-CM | POA: Diagnosis not present

## 2023-09-22 DIAGNOSIS — Z9483 Pancreas transplant status: Secondary | ICD-10-CM | POA: Diagnosis not present

## 2023-09-22 DIAGNOSIS — D849 Immunodeficiency, unspecified: Secondary | ICD-10-CM | POA: Diagnosis not present

## 2023-09-22 DIAGNOSIS — Z940 Kidney transplant status: Secondary | ICD-10-CM | POA: Diagnosis not present

## 2023-09-30 DIAGNOSIS — L02211 Cutaneous abscess of abdominal wall: Secondary | ICD-10-CM | POA: Diagnosis not present

## 2023-09-30 DIAGNOSIS — Z9483 Pancreas transplant status: Secondary | ICD-10-CM | POA: Diagnosis not present

## 2023-09-30 DIAGNOSIS — D849 Immunodeficiency, unspecified: Secondary | ICD-10-CM | POA: Diagnosis not present

## 2023-09-30 DIAGNOSIS — Z4889 Encounter for other specified surgical aftercare: Secondary | ICD-10-CM | POA: Diagnosis not present

## 2023-09-30 DIAGNOSIS — Z940 Kidney transplant status: Secondary | ICD-10-CM | POA: Diagnosis not present

## 2023-10-21 DIAGNOSIS — D84821 Immunodeficiency due to drugs: Secondary | ICD-10-CM | POA: Diagnosis not present

## 2023-10-21 DIAGNOSIS — E213 Hyperparathyroidism, unspecified: Secondary | ICD-10-CM | POA: Diagnosis not present

## 2023-10-21 DIAGNOSIS — Z79624 Long term (current) use of inhibitors of nucleotide synthesis: Secondary | ICD-10-CM | POA: Diagnosis not present

## 2023-10-21 DIAGNOSIS — N186 End stage renal disease: Secondary | ICD-10-CM | POA: Diagnosis not present

## 2023-10-21 DIAGNOSIS — E109 Type 1 diabetes mellitus without complications: Secondary | ICD-10-CM | POA: Diagnosis not present

## 2023-10-21 DIAGNOSIS — L02211 Cutaneous abscess of abdominal wall: Secondary | ICD-10-CM | POA: Diagnosis not present

## 2023-10-21 DIAGNOSIS — D849 Immunodeficiency, unspecified: Secondary | ICD-10-CM | POA: Diagnosis not present

## 2023-10-21 DIAGNOSIS — Z940 Kidney transplant status: Secondary | ICD-10-CM | POA: Diagnosis not present

## 2023-10-21 DIAGNOSIS — Z992 Dependence on renal dialysis: Secondary | ICD-10-CM | POA: Diagnosis not present

## 2023-10-21 DIAGNOSIS — Z79899 Other long term (current) drug therapy: Secondary | ICD-10-CM | POA: Diagnosis not present

## 2023-10-21 DIAGNOSIS — E7800 Pure hypercholesterolemia, unspecified: Secondary | ICD-10-CM | POA: Diagnosis not present

## 2023-10-21 DIAGNOSIS — J45909 Unspecified asthma, uncomplicated: Secondary | ICD-10-CM | POA: Diagnosis not present

## 2023-10-21 DIAGNOSIS — I129 Hypertensive chronic kidney disease with stage 1 through stage 4 chronic kidney disease, or unspecified chronic kidney disease: Secondary | ICD-10-CM | POA: Diagnosis not present

## 2023-10-21 DIAGNOSIS — Z4822 Encounter for aftercare following kidney transplant: Secondary | ICD-10-CM | POA: Diagnosis not present

## 2023-10-21 DIAGNOSIS — Z4889 Encounter for other specified surgical aftercare: Secondary | ICD-10-CM | POA: Diagnosis not present

## 2023-10-21 DIAGNOSIS — Z9483 Pancreas transplant status: Secondary | ICD-10-CM | POA: Diagnosis not present

## 2023-10-28 DIAGNOSIS — D849 Immunodeficiency, unspecified: Secondary | ICD-10-CM | POA: Diagnosis not present

## 2023-10-28 DIAGNOSIS — Z4889 Encounter for other specified surgical aftercare: Secondary | ICD-10-CM | POA: Diagnosis not present

## 2023-10-28 DIAGNOSIS — L02211 Cutaneous abscess of abdominal wall: Secondary | ICD-10-CM | POA: Diagnosis not present

## 2023-10-28 DIAGNOSIS — Z9483 Pancreas transplant status: Secondary | ICD-10-CM | POA: Diagnosis not present

## 2023-10-28 DIAGNOSIS — Z940 Kidney transplant status: Secondary | ICD-10-CM | POA: Diagnosis not present

## 2023-11-04 DIAGNOSIS — D849 Immunodeficiency, unspecified: Secondary | ICD-10-CM | POA: Diagnosis not present

## 2023-11-04 DIAGNOSIS — Z5181 Encounter for therapeutic drug level monitoring: Secondary | ICD-10-CM | POA: Diagnosis not present

## 2023-11-04 DIAGNOSIS — Z940 Kidney transplant status: Secondary | ICD-10-CM | POA: Diagnosis not present

## 2023-11-04 DIAGNOSIS — Z9483 Pancreas transplant status: Secondary | ICD-10-CM | POA: Diagnosis not present

## 2023-11-11 DIAGNOSIS — E1065 Type 1 diabetes mellitus with hyperglycemia: Secondary | ICD-10-CM | POA: Diagnosis not present

## 2023-11-16 ENCOUNTER — Telehealth: Payer: Self-pay

## 2023-11-16 NOTE — Telephone Encounter (Signed)
 Patient is overdue for an appointment for chronic issues. LMOM for patient to call and schedule. Mychart message has been sent as well.

## 2023-11-29 NOTE — Progress Notes (Shared)
 [facility] - Clinic Note  12/13/2023     CHIEF COMPLAINT Patient presents for No chief complaint on file.   HISTORY OF PRESENT ILLNESS: Barbara Donovan is a 33 y.o. female who presents to the clinic today for:    Patient does not feel like her vision improved after the Yag  Referring physician: Kandis Perkins, DO [address], [address] [place],  [place] [address]  HISTORICAL INFORMATION:   Selected notes from the MEDICAL RECORD NUMBER Referred by Dr. CANDIE Kitty for concern of bilateral CRVO   CURRENT MEDICATIONS: No current outpatient medications on file. (Ophthalmic Drugs)   No current facility-administered medications for this visit. (Ophthalmic Drugs)   Current Outpatient Medications (Other)  Medication Sig   acetaminophen  (TYLENOL ) 325 MG tablet Take 650 mg by mouth every 6 (six) hours as needed for mild pain.   acyclovir (ZOVIRAX) 400 MG tablet Take 400 mg by mouth 2 (two) times daily.   amLODipine  (NORVASC ) 10 MG tablet Take 1 tablet (10 mg total) by mouth daily. (Patient not taking: Reported on 07/06/2023)   amLODipine  (NORVASC ) 5 MG tablet Take 5 mg by mouth daily.   amoxicillin -clavulanate (AUGMENTIN ) 875-125 MG tablet Take 1 tablet by mouth 2 (two) times daily.   aspirin  EC 81 MG tablet Take 81 mg by mouth daily.   calcitRIOL  (ROCALTROL ) 0.5 MCG capsule Take 0.5 mcg by mouth in the morning, at noon, and at bedtime. (Patient not taking: Reported on 07/06/2023)   carvedilol  (COREG ) 25 MG tablet Take 1 tablet (25 mg total) by mouth 2 (two) times daily.   Continuous Glucose Sensor (DEXCOM G7 SENSOR) MISC 1 Device by Does not apply route as directed.   famotidine  (PEPCID ) 20 MG tablet Take 1 tablet by mouth 2 (two) times daily.   folic acid  (FOLVITE ) 1 MG tablet Take 1 mg by mouth daily.   insulin  degludec (TRESIBA  FLEXTOUCH) 200 UNIT/ML FlexTouch Pen Inject 14 Units into the skin daily in the afternoon.   insulin  lispro (HUMALOG  KWIKPEN) 100  UNIT/ML KwikPen Max daily 30 units   mirtazapine  (REMERON ) 15 MG tablet Take 1 tablet (15 mg total) by mouth at bedtime. Do not take medicine in the morning and drive.   mycophenolate (CELLCEPT) 250 MG capsule Take 1,000 mg by mouth 2 (two) times daily.   oxyCODONE  (OXY IR/ROXICODONE ) 5 MG immediate release tablet Take by mouth.   pantoprazole  (PROTONIX ) 40 MG tablet Take 40 mg by mouth 2 (two) times daily.   predniSONE (DELTASONE) 5 MG tablet Take 20 mg by mouth daily.   senna-docusate (SENOKOT-S) 8.6-50 MG tablet Take 2 tablets by mouth 2 (two) times daily as needed for moderate constipation or mild constipation.   sodium bicarbonate  650 MG tablet Take 1,300 mg by mouth 2 (two) times daily. (Patient not taking: Reported on 07/06/2023)   sulfamethoxazole-trimethoprim (BACTRIM DS) 800-160 MG tablet Take 1 tablet by mouth every Monday, Wednesday, and Friday.   tacrolimus  (PROGRAF ) 1 MG capsule Take 7 mg by mouth 2 (two) times daily.   No current facility-administered medications for this visit. (Other)   REVIEW OF SYSTEMS:   ALLERGIES Allergies  Allergen Reactions   Silicone Other (See Comments)    Blister with CHG dressing (ok to use IV 3000). Sensitive to the tape and not the actual CHG site    PAST MEDICAL HISTORY Past Medical History:  Diagnosis Date   Anxiety    Asthma    as a child, no problems as  an adult, no inhaler   Cataract    NS OU   Chronic hypertension during pregnancy, antepartum 08/19/2017   Dehydration 01/28/2018   Depression    Depression during pregnancy, antepartum 07/07/2017   6/20: Short trial of zoloft  previously, reports didn't help much but also didn't give it a chance Discussed r/b/a SSRIs in pregnancy, agrees to try Zoloft  again, rx sent No SI/HI/red flags   Diabetes (HCC)    TYPE I. 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   ESRD on peritoneal  dialysis (HCC)    HTN (hypertension)    Hypertensive retinopathy    OU   Hypokalemia 01/22/2018   Hypomagnesemia 01/28/2018   Intractable nausea and vomiting 01/22/2018   Intrauterine growth restriction (IUGR) affecting care of mother 12/22/2017   Lung nodule seen on imaging study    Recommended follow-up 3 to 6 months (May - August 2025) for repeat chest CT.   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 A2/B GDM)  Required Referrals for A1GDM or A2GDM: [x]  Diabetes Education and Testing Supplies [x]  Nutrition Cousult  For A2/B GDM or higher classes of DM [x]  Diabetes Education and Testing Supplies [x]  Nutrition Counsult [x]  Fetal ECHO after 20 weeks  [x]  Eye exam for retina evaluation - severe retinopathy 7/19  Base   Ventricular septal defect (VSD) of fetus in singleton pregnancy, antepartum 09/30/2017   May go to newborn nursery per Dr. Lajuanda Echo prior to discharge   Past Surgical History:  Procedure Laterality Date   25 GAUGE PARS PLANA VITRECTOMY WITH 20 GAUGE MVR PORT FOR MACULAR HOLE Left 07/20/2018   Procedure: 25 GAUGE PARS PLANA VITRECTOMY LEFT EYE;  Surgeon: Valdemar Rogue, MD;  Location: [facility] OR;  Service: Ophthalmology;  Laterality: Left;   AV FISTULA PLACEMENT Left 08/11/2021   Procedure: INSERTION OF LEFT ARM ARTERIOVENOUS (AV) GORE-TEX GRAFT;  Surgeon: Lanis Fonda BRAVO, MD;  Location: [facility] OR;  Service: Vascular;  Laterality: Left;  PERIPHERAL NERVE BLOCK   AV FISTULA PLACEMENT Right 08/24/2022   Procedure: RIGHT UPPER EXTREMITY ARTERIOVENOUS (AV) FISTULA CREATION;  Surgeon: Eliza Lonni RAMAN, MD;  Location: [facility] OR;  Service: Vascular;  Laterality: Right;   AVGG REMOVAL Left 06/22/2022   Procedure: REMOVAL OF ARTERIOVENOUS GORETEX GRAFT (AVGG) LEFT ARM WITH VEIN  PATCH OF BRACHIAL ARTERY;  Surgeon: Eliza Lonni RAMAN, MD;  Location: [facility] OR;  Service: Vascular;  Laterality: Left;   BASCILIC VEIN TRANSPOSITION Right 11/29/2022   Procedure: RIGHT ARM BASILIC VEIN TRANSPOSITION;  Surgeon: Lanis Fonda BRAVO, MD;  Location: [facility] OR;  Service: Vascular;  Laterality: Right;   CAPD INSERTION N/A 09/10/2020   Procedure: LAPAROSCOPIC INSERTION CONTINUOUS AMBULATORY PERITONEAL DIALYSIS  (CAPD) CATHETER;  Surgeon: Serene Gaile ORN, MD;  Location: [facility] OR;  Service: Vascular;  Laterality: N/A;   CESAREAN SECTION N/A 12/26/2017   Procedure: CESAREAN SECTION;  Surgeon: Herchel Gloris LABOR, MD;  Location: [facility];  Service: Obstetrics;  Laterality: N/A;   EYE EXAMINATION UNDER ANESTHESIA Right 07/20/2018   Procedure: Eye Exam Under Anesthesia RIGHT EYE;  Surgeon: Valdemar Rogue, MD;  Location: [facility] OR;  Service: Ophthalmology;  Laterality: Right;   EYE SURGERY Left 07/2018   GAS INSERTION Left  07/19/2019   Procedure: INSERTION OF GAS;  Surgeon: Valdemar Rogue, MD;  Location: [facility] OR;  Service: Ophthalmology;  Laterality: Left;  SF6   INJECTION OF SILICONE OIL Left 07/20/2018   Procedure: Injection Of Silicone Oil LEFT EYE;  Surgeon: Valdemar Rogue, MD;  Location: [facility] OR;  Service: Ophthalmology;  Laterality: Left;   INSERTION OF DIALYSIS CATHETER Right 07/05/2021   Procedure: REMOVAL OF DIALYSIS CATHETER;  Surgeon: Magda Debby SAILOR, MD;  Location: [facility] OR;  Service: Vascular;  Laterality: Right;   INSERTION OF DIALYSIS CATHETER Right 07/09/2021   Procedure: INSERTION OF TUNNELED DIALYSIS CATHETER;  Surgeon: Magda Debby SAILOR, MD;  Location: [facility] OR;  Service: Vascular;  Laterality: Right;   INSERTION OF DIALYSIS CATHETER Left 07/09/2021   Procedure: INSERTION OF TUNNELED  DIALYSIS CATHETER;  Surgeon: Magda Debby SAILOR, MD;  Location: [facility] OR;  Service: Vascular;  Laterality: Left;   INSERTION OF DIALYSIS CATHETER N/A 06/22/2022   Procedure: INSERTION OF LEFT INTERNAL JUGULAR TRIALYSIS  DIALYSIS CATHETER;  Surgeon: Eliza Lonni RAMAN, MD;  Location: [facility] OR;  Service: Vascular;  Laterality: N/A;   INSERTION OF DIALYSIS CATHETER Left 06/28/2022   Procedure: ULTRASOUND GUIDED INSERTION OF TUNNELED DIALYSIS CATHETER;  Surgeon: Magda Debby SAILOR, MD;  Location: [facility] OR;  Service: Vascular;  Laterality: Left;   INSERTION OF DIALYSIS CATHETER Left 11/29/2022   Procedure: INSERTION OF TUNNELED DIALYSIS CATHETER TO LEFT FEMORAL VEIN;  Surgeon: Lanis Fonda BRAVO, MD;  Location: [facility] OR;  Service: Vascular;  Laterality: Left;   IR FLUORO GUIDE CV LINE RIGHT  06/02/2020   IR US  GUIDE VASC ACCESS RIGHT  06/02/2020   LASER PHOTO ABLATION Right 07/20/2018   Procedure: Laser Photo Ablation RIGHT EYE;  Surgeon: Valdemar Rogue, MD;  Location: [facility] OR;  Service: Ophthalmology;  Laterality: Right;   MEMBRANE PEEL Left 07/20/2018   Procedure: Membrane Peel LEFT EYE;  Surgeon: Valdemar Rogue, MD;  Location: [facility] OR;  Service: Ophthalmology;  Laterality: Left;   MEMBRANE PEEL Left 07/19/2019   Procedure: MEMBRANE PEEL;  Surgeon: Valdemar Rogue, MD;  Location: [facility] OR;  Service: Ophthalmology;  Laterality: Left;   MITOMYCIN  C APPLICATION Bilateral 07/20/2018   Procedure: Avastin  Application;  Surgeon: Valdemar Rogue, MD;  Location: [facility] OR;  Service: Ophthalmology;  Laterality: Bilateral;   PHOTOCOAGULATION WITH LASER Left 07/20/2018   Procedure: Photocoagulation With Laser LEFT EYE;  Surgeon: Valdemar Rogue, MD;  Location: [facility] OR;  Service: Ophthalmology;  Laterality: Left;   PHOTOCOAGULATION WITH LASER Left 07/19/2019   Procedure: PHOTOCOAGULATION WITH LASER;  Surgeon: Valdemar Rogue, MD;  Location: [facility] OR;  Service: Ophthalmology;  Laterality: Left;   RETINAL DETACHMENT SURGERY Left 07/20/2018   Dr. Rogue Valdemar   SILICON OIL REMOVAL Left 07/19/2019   Procedure: 25g PARS PLANA VITRECTOMY WITH SILICON OIL REMOVAL;  Surgeon: Valdemar Rogue, MD;  Location: [facility] OR;  Service: Ophthalmology;  Laterality: Left;   TEE WITHOUT CARDIOVERSION  N/A 07/08/2021   Procedure: TRANSESOPHAGEAL ECHOCARDIOGRAM (TEE);  Surgeon: Kate Lonni CROME, MD;  Location: [facility] ENDOSCOPY;  Service: Cardiovascular;  Laterality: N/A;   TEE WITHOUT CARDIOVERSION N/A 06/29/2022   Procedure: TRANSESOPHAGEAL ECHOCARDIOGRAM;  Surgeon: Delford Maude BROCKS, MD;  Location: [facility] INVASIVE CV LAB;  Service: Cardiovascular;  Laterality: N/A;   WISDOM TOOTH EXTRACTION     FAMILY HISTORY Family History  Problem Relation Age of Onset   Diabetes Mother    Aneurysm Mother 9   Seizures Mother    Diabetes Father    Cataracts Father    COPD Father  Heart attack Father    Heart disease Father    Healthy Sister    Healthy Daughter    Stroke Maternal Grandfather    Amblyopia Neg Hx    Blindness Neg Hx    Glaucoma Neg Hx    Macular degeneration Neg Hx    Retinal detachment Neg Hx    Strabismus Neg Hx    Retinitis pigmentosa Neg Hx    Colon cancer Neg Hx    Stomach cancer Neg Hx    Esophageal cancer Neg Hx    Pancreatic cancer Neg Hx    Liver disease Neg Hx    SOCIAL HISTORY Social History   Tobacco Use   Smoking status: Never    Passive exposure: Never   Smokeless tobacco: Never  Vaping Use   Vaping status: Never Used  Substance Use Topics   Alcohol use: Not Currently   Drug use: Never       OPHTHALMIC EXAM:  Not recorded     IMAGING AND PROCEDURES  Imaging and Procedures for @TODAY @          ASSESSMENT/PLAN:  No diagnosis found.  1-3. 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 (OS > OD)  - A1c: 7.0 on 02.21.25  **lost to f/u from 02.08.22 to 01.22.25 (almost 3 years)**  - most recent A1C 6.2 (09.05.24)  - s/p PRP OS (11.26.19)  - s/p PRP OD (06.01.20)  - lost to f/u following 11.26.19 appt due to complicated pregnancy, premature birth of baby w/ extended NICU stay and congenital heart defect, and post-partum health issues OS  - history of vision loss OS onset December 2019  - OS with chronic VH and TRD, likely present  since Dec 2019  - s/p IVA OS #1 (06.23.20), #2 (04.02.21)  - s/p PPV/MP/EL/silicon oil + IVA OS for VH and TRD from DM1, 07.02.20             - had cataract surgery OS with Dr. Marcey, 12.15.20 -- looks good, +PCO  - s/p PPV/SOR removal/Tissue Blue stain/MP/20% SF6 gas, IVA OS, 07.01.21   OD  - today, VH stably improved  - s/p fill in PRP and IVA OD #1 07.02.2020 in operating room  - diffuse VH OD ~11.26.20   - s/p IVA OD #2 (11.30.20), #3 (09.24.21)  - BCVA OD 20/25  - f/u 6-9 months  4,5. Epiretinal membrane, left eye --  - focal ERM with retinal thickening nasal macula   - s/p TissueBlue  assisted membrane peel OS as above (7.1.21)  - OCT today shows improved nasal thickening post-ERM peel   6,7. Hypertensive retinopathy OU - there was concern for pregnancy related hypertension (I.e. Pre-eclampsia, Eclampsia, HELLP, etc) during pregnancy - also during pregnancy (7.17.2019), had bilateral CRVO w/ CME (OD > OS) s/p PRP OS  - discussed importance of tight BP control  - monitor  8. Mixed cataract OD - The symptoms of cataract, surgical options, and treatments and risks were discussed with patient.  - not visually significant    9. Pseudophakia OS  - s/p CE/IOL OS w/ Dr. Marcey, 12.15.20  - beautiful surgery  - s/p YAG cap OS (01.22.25) -- good PC opening  - BCVA remains 20/20 -- limited by retinal atrophy  Ophthalmic Meds Ordered this visit:  No orders of the defined types were placed in this encounter.     This document serves as a record of services personally performed by Redell JUDITHANN Hans, MD, PhD. It was created on their  behalf by Wanda GEANNIE Keens, COT an ophthalmic technician. The creation of this record is the provider's dictation and/or activities during the visit.    Electronically signed by:  Wanda GEANNIE Keens, COT  11/29/23 7:15 AM   Redell JUDITHANN Hans, M.D., Ph.D. Diseases & Surgery of the Retina and Vitreous [facility] 03/15/2023    Abbreviations: M myopia (nearsighted); A astigmatism; H hyperopia (farsighted); P presbyopia; Mrx spectacle prescription;  CTL contact lenses; OD right eye; OS left eye; OU both eyes  XT exotropia; ET esotropia; PEK punctate epithelial keratitis; PEE punctate epithelial erosions; DES dry eye syndrome; MGD meibomian gland dysfunction; ATs artificial tears; PFAT's preservative free artificial tears; NSC nuclear sclerotic cataract; PSC posterior subcapsular cataract; ERM epi-retinal membrane; PVD posterior vitreous detachment; RD retinal detachment; DM diabetes mellitus; DR diabetic retinopathy; NPDR non-proliferative diabetic retinopathy; PDR proliferative diabetic retinopathy; CSME clinically significant macular edema; DME diabetic macular edema; dbh dot blot hemorrhages; CWS cotton wool spot; POAG primary open angle glaucoma; C/D cup-to-disc ratio; HVF humphrey visual field; GVF goldmann visual field; OCT optical coherence tomography; IOP intraocular pressure; BRVO Branch retinal vein occlusion; CRVO central retinal vein occlusion; CRAO central retinal artery occlusion; BRAO branch retinal artery occlusion; RT retinal tear; SB scleral buckle; PPV pars plana vitrectomy; VH Vitreous hemorrhage; PRP panretinal laser photocoagulation; IVK intravitreal kenalog ; VMT vitreomacular traction; MH Macular hole;  NVD neovascularization of the disc; NVE neovascularization elsewhere; AREDS age related eye disease study; ARMD age related macular degeneration; POAG primary open angle glaucoma; EBMD epithelial/anterior basement membrane dystrophy; ACIOL anterior chamber intraocular lens; IOL intraocular lens; PCIOL posterior chamber intraocular lens; Phaco/IOL phacoemulsification with intraocular lens placement; PRK photorefractive keratectomy; LASIK laser assisted in situ keratomileusis; HTN hypertension; DM diabetes mellitus; COPD chronic obstructive pulmonary disease

## 2023-12-13 ENCOUNTER — Encounter (INDEPENDENT_AMBULATORY_CARE_PROVIDER_SITE_OTHER): Payer: Medicare (Managed Care) | Admitting: Ophthalmology

## 2023-12-13 DIAGNOSIS — H35033 Hypertensive retinopathy, bilateral: Secondary | ICD-10-CM

## 2023-12-13 DIAGNOSIS — Z794 Long term (current) use of insulin: Secondary | ICD-10-CM

## 2023-12-13 DIAGNOSIS — H26492 Other secondary cataract, left eye: Secondary | ICD-10-CM

## 2023-12-13 DIAGNOSIS — E103513 Type 1 diabetes mellitus with proliferative diabetic retinopathy with macular edema, bilateral: Secondary | ICD-10-CM

## 2023-12-13 DIAGNOSIS — Z961 Presence of intraocular lens: Secondary | ICD-10-CM

## 2023-12-13 DIAGNOSIS — H35372 Puckering of macula, left eye: Secondary | ICD-10-CM

## 2023-12-13 DIAGNOSIS — I10 Essential (primary) hypertension: Secondary | ICD-10-CM

## 2023-12-13 DIAGNOSIS — H4313 Vitreous hemorrhage, bilateral: Secondary | ICD-10-CM

## 2023-12-13 DIAGNOSIS — H25811 Combined forms of age-related cataract, right eye: Secondary | ICD-10-CM

## 2023-12-13 DIAGNOSIS — H3342 Traction detachment of retina, left eye: Secondary | ICD-10-CM

## 2024-01-18 ENCOUNTER — Telehealth: Payer: Self-pay | Admitting: Internal Medicine

## 2024-01-18 ENCOUNTER — Telehealth: Admitting: Internal Medicine

## 2024-01-18 DIAGNOSIS — Z9483 Pancreas transplant status: Secondary | ICD-10-CM

## 2024-01-18 DIAGNOSIS — Z940 Kidney transplant status: Secondary | ICD-10-CM | POA: Diagnosis not present

## 2024-01-18 DIAGNOSIS — E109 Type 1 diabetes mellitus without complications: Secondary | ICD-10-CM

## 2024-01-18 DIAGNOSIS — E10319 Type 1 diabetes mellitus with unspecified diabetic retinopathy without macular edema: Secondary | ICD-10-CM

## 2024-01-18 NOTE — Telephone Encounter (Signed)
Please schedule the patient for follow-up with me in 6 months    Thanks
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
## 2024-01-24 NOTE — Progress Notes (Signed)
 " [facility] - Clinic Note  02/07/2024     CHIEF COMPLAINT Patient presents for Retina Follow Up   HISTORY OF PRESENT ILLNESS: Barbara Donovan is a 34 y.o. female who presents to the clinic today for:   HPI     Retina Follow Up   Patient presents with  Diabetic Retinopathy.  In both eyes.  This started 11 months ago.  Severity is moderate.  Duration of 11 months.  Since onset it is stable.  I, the attending physician,  performed the HPI with the patient and updated documentation appropriately.        Comments   Patient feels the vision is the same. She is not using eye drops. 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       Last edited by Valdemar Rogue, MD on 02/15/2024 12:38 PM.    Patient states she has under gone a kidney transplant at [facility]. Pt was approved 4.8.25 and got a call 4.13.25. She's doing well. No vision issues reported.   Referring physician: Kandis Perkins, DO [address] [place],  [place] [address]  HISTORICAL INFORMATION:   Selected notes from the MEDICAL RECORD NUMBER Referred by Dr. CANDIE Kitty for concern of bilateral CRVO   CURRENT MEDICATIONS: No current outpatient medications on file. (Ophthalmic Drugs)   No current facility-administered medications for this visit. (Ophthalmic Drugs)   Current Outpatient Medications (Other)  Medication Sig   acetaminophen  (TYLENOL ) 325 MG tablet Take 650 mg by mouth every 6 (six) hours as needed for mild pain.   acyclovir (ZOVIRAX) 400 MG tablet Take 400 mg by mouth 2 (two) times daily.   amLODipine  (NORVASC ) 10 MG tablet Take 1 tablet (10 mg total) by mouth daily.   amLODipine  (NORVASC ) 5 MG tablet Take 5 mg by mouth daily.   amoxicillin -clavulanate (AUGMENTIN ) 875-125 MG tablet Take 1 tablet by mouth 2 (two) times daily.   aspirin  EC 81 MG tablet Take 81 mg by mouth daily.   calcitRIOL  (ROCALTROL ) 0.5 MCG capsule Take 0.5 mcg by mouth in the morning, at noon, and at  bedtime.   carvedilol  (COREG ) 25 MG tablet Take 1 tablet (25 mg total) by mouth 2 (two) times daily.   Continuous Glucose Sensor (DEXCOM G7 SENSOR) MISC 1 Device by Does not apply route as directed.   famotidine  (PEPCID ) 20 MG tablet Take 1 tablet by mouth 2 (two) times daily.   folic acid  (FOLVITE ) 1 MG tablet Take 1 mg by mouth daily.   insulin  degludec (TRESIBA  FLEXTOUCH) 200 UNIT/ML FlexTouch Pen Inject 14 Units into the skin daily in the afternoon.   insulin  lispro (HUMALOG  KWIKPEN) 100 UNIT/ML KwikPen Max daily 30 units   mirtazapine  (REMERON ) 15 MG tablet Take 1 tablet (15 mg total) by mouth at bedtime. Do not take medicine in the morning and drive.   mycophenolate (CELLCEPT) 250 MG capsule Take 1,000 mg by mouth 2 (two) times daily.   oxyCODONE  (OXY IR/ROXICODONE ) 5 MG immediate release tablet Take by mouth.   pantoprazole  (PROTONIX ) 40 MG tablet Take 40 mg by mouth 2 (two) times daily.   predniSONE (DELTASONE) 5 MG tablet Take 20 mg by mouth daily.   senna-docusate (SENOKOT-S) 8.6-50 MG tablet Take 2 tablets by mouth 2 (two) times daily as needed for moderate constipation or mild constipation.   sodium bicarbonate  650 MG tablet Take 1,300 mg by mouth 2 (two) times daily.   sulfamethoxazole-trimethoprim (BACTRIM DS) 800-160 MG  tablet Take 1 tablet by mouth every Monday, Wednesday, and Friday.   tacrolimus  (PROGRAF ) 1 MG capsule Take 7 mg by mouth 2 (two) times daily.   No current facility-administered medications for this visit. (Other)   REVIEW OF SYSTEMS: ROS   Positive for: Neurological, Genitourinary, Endocrine, Eyes, Respiratory Negative for: Constitutional, Gastrointestinal, Skin, Musculoskeletal, HENT, Cardiovascular, Psychiatric, Allergic/Imm, Heme/Lymph Last edited by Myra Wanda SAILOR, COT on 02/07/2024  7:51 AM.      ALLERGIES Allergies  Allergen Reactions   Silicone Other (See Comments)    Blister with CHG dressing (ok to use IV 3000). Sensitive to the tape and not  the actual CHG site    PAST MEDICAL HISTORY Past Medical History:  Diagnosis Date   Anxiety    Asthma    as a child, no problems as an adult, no inhaler   Cataract    NS OU   Chronic hypertension during pregnancy, antepartum 08/19/2017   Dehydration 01/28/2018   Depression    Depression during pregnancy, antepartum 07/07/2017   6/20: Short trial of zoloft  previously, reports didn't help much but also didn't give it a chance Discussed r/b/a SSRIs in pregnancy, agrees to try Zoloft  again, rx sent No SI/HI/red flags   Diabetes (HCC)    TYPE I. 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   ESRD on peritoneal dialysis (HCC)    HTN (hypertension)    Hypertensive retinopathy    OU   Hypokalemia 01/22/2018   Hypomagnesemia 01/28/2018   Intractable nausea and vomiting 01/22/2018   Intrauterine growth restriction (IUGR) affecting care of mother 12/22/2017   Lung nodule seen on imaging study    Recommended follow-up 3 to 6 months (May - August 2025) for repeat chest CT.   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 A2/B GDM)  Required Referrals for A1GDM or A2GDM: [x]  Diabetes Education and Testing Supplies [x]  Nutrition Cousult  For A2/B GDM or higher classes of DM [x]  Diabetes Education and Testing Supplies [x]  Nutrition Counsult [x]  Fetal ECHO after 20 weeks  [x]  Eye exam for retina evaluation - severe retinopathy 7/19  Base   Ventricular septal defect (VSD) of fetus in singleton pregnancy, antepartum 09/30/2017   May go to newborn nursery per Dr. Lajuanda Echo prior to discharge   Past Surgical History:  Procedure Laterality Date   25 GAUGE PARS PLANA VITRECTOMY WITH 20  GAUGE MVR PORT FOR MACULAR HOLE Left 07/20/2018   Procedure: 25 GAUGE PARS PLANA VITRECTOMY LEFT EYE;  Surgeon: Valdemar Rogue, MD;  Location: [facility] OR;  Service: Ophthalmology;  Laterality: Left;   AV FISTULA PLACEMENT Left 08/11/2021   Procedure: INSERTION OF LEFT ARM ARTERIOVENOUS (AV) GORE-TEX GRAFT;  Surgeon: Lanis Fonda BRAVO, MD;  Location: [facility] OR;  Service: Vascular;  Laterality: Left;  PERIPHERAL NERVE BLOCK   AV FISTULA PLACEMENT Right 08/24/2022   Procedure: RIGHT UPPER EXTREMITY ARTERIOVENOUS (AV) FISTULA CREATION;  Surgeon: Eliza Lonni RAMAN, MD;  Location: [facility] OR;  Service: Vascular;  Laterality: Right;   AVGG REMOVAL Left 06/22/2022   Procedure: REMOVAL OF ARTERIOVENOUS GORETEX GRAFT (AVGG) LEFT ARM WITH VEIN PATCH OF BRACHIAL ARTERY;  Surgeon: Eliza Lonni RAMAN, MD;  Location: [facility] OR;  Service: Vascular;  Laterality: Left;   BASCILIC VEIN TRANSPOSITION Right 11/29/2022   Procedure: RIGHT ARM BASILIC VEIN TRANSPOSITION;  Surgeon: Lanis Fonda BRAVO, MD;  Location: [facility] OR;  Service: Vascular;  Laterality: Right;   CAPD INSERTION N/A 09/10/2020   Procedure: LAPAROSCOPIC INSERTION CONTINUOUS AMBULATORY PERITONEAL DIALYSIS  (CAPD) CATHETER;  Surgeon: Serene Gaile ORN, MD;  Location: [facility] OR;  Service: Vascular;  Laterality: N/A;   CESAREAN SECTION N/A 12/26/2017   Procedure: CESAREAN SECTION;  Surgeon: Herchel Gloris LABOR, MD;  Location: [facility];  Service: Obstetrics;  Laterality: N/A;   EYE EXAMINATION UNDER ANESTHESIA Right 07/20/2018   Procedure: Eye Exam Under Anesthesia RIGHT EYE;  Surgeon: Valdemar Rogue, MD;  Location: [facility] OR;  Service: Ophthalmology;  Laterality: Right;   EYE SURGERY Left 07/2018   GAS INSERTION Left 07/19/2019   Procedure: INSERTION OF GAS;  Surgeon: Valdemar Rogue, MD;  Location: [facility] OR;  Service: Ophthalmology;  Laterality: Left;  SF6   INJECTION OF SILICONE OIL Left 07/20/2018   Procedure: Injection Of Silicone Oil LEFT EYE;  Surgeon: Valdemar Rogue, MD;  Location: [facility]  OR;  Service: Ophthalmology;  Laterality: Left;   INSERTION OF DIALYSIS CATHETER Right 07/05/2021   Procedure: REMOVAL OF DIALYSIS CATHETER;  Surgeon: Magda Debby SAILOR, MD;  Location: [facility] OR;  Service: Vascular;  Laterality: Right;   INSERTION OF DIALYSIS CATHETER Right 07/09/2021   Procedure: INSERTION OF TUNNELED DIALYSIS CATHETER;  Surgeon: Magda Debby SAILOR, MD;  Location: [facility] OR;  Service: Vascular;  Laterality: Right;   INSERTION OF DIALYSIS CATHETER Left 07/09/2021   Procedure: INSERTION OF TUNNELED  DIALYSIS CATHETER;  Surgeon: Magda Debby SAILOR, MD;  Location: [facility] OR;  Service: Vascular;  Laterality: Left;   INSERTION OF DIALYSIS CATHETER N/A 06/22/2022   Procedure: INSERTION OF LEFT INTERNAL JUGULAR TRIALYSIS DIALYSIS CATHETER;  Surgeon: Eliza Lonni RAMAN, MD;  Location: [facility] OR;  Service: Vascular;  Laterality: N/A;   INSERTION OF DIALYSIS CATHETER Left 06/28/2022   Procedure: ULTRASOUND GUIDED INSERTION OF TUNNELED DIALYSIS CATHETER;  Surgeon: Magda Debby SAILOR, MD;  Location: [facility] OR;  Service: Vascular;  Laterality: Left;   INSERTION OF DIALYSIS CATHETER Left 11/29/2022   Procedure: INSERTION OF TUNNELED DIALYSIS CATHETER TO LEFT FEMORAL VEIN;  Surgeon: Lanis Fonda BRAVO, MD;  Location: [facility] OR;  Service: Vascular;  Laterality: Left;   IR FLUORO GUIDE CV LINE RIGHT  06/02/2020   IR US  GUIDE VASC ACCESS RIGHT  06/02/2020   LASER PHOTO ABLATION Right 07/20/2018   Procedure: Laser Photo Ablation RIGHT EYE;  Surgeon: Valdemar Rogue, MD;  Location: [facility] OR;  Service: Ophthalmology;  Laterality: Right;   MEMBRANE PEEL Left 07/20/2018   Procedure: Membrane Peel LEFT EYE;  Surgeon: Valdemar Rogue, MD;  Location: [facility] OR;  Service: Ophthalmology;  Laterality: Left;   MEMBRANE PEEL Left 07/19/2019   Procedure: MEMBRANE PEEL;  Surgeon: Valdemar Rogue, MD;  Location: [facility] OR;  Service: Ophthalmology;  Laterality: Left;   MITOMYCIN  C APPLICATION Bilateral 07/20/2018   Procedure: Avastin  Application;  Surgeon: Valdemar Rogue,  MD;  Location: [facility] OR;  Service: Ophthalmology;  Laterality: Bilateral;   PHOTOCOAGULATION WITH LASER Left 07/20/2018   Procedure: Photocoagulation With Laser LEFT EYE;  Surgeon: Valdemar Rogue, MD;  Location: [facility] OR;  Service: Ophthalmology;  Laterality: Left;   PHOTOCOAGULATION WITH LASER Left 07/19/2019   Procedure: PHOTOCOAGULATION WITH LASER;  Surgeon: Valdemar Rogue, MD;  Location: [facility] OR;  Service: Ophthalmology;  Laterality: Left;   RETINAL DETACHMENT SURGERY Left 07/20/2018   Dr. Rogue  Emanuela Runnion   SILICON OIL REMOVAL Left 07/19/2019   Procedure: 25g PARS PLANA VITRECTOMY WITH SILICON OIL REMOVAL;  Surgeon: Valdemar Rogue, MD;  Location: [facility] OR;  Service: Ophthalmology;  Laterality: Left;   TEE WITHOUT CARDIOVERSION N/A 07/08/2021   Procedure: TRANSESOPHAGEAL ECHOCARDIOGRAM (TEE);  Surgeon: Kate Lonni CROME, MD;  Location: [facility] ENDOSCOPY;  Service: Cardiovascular;  Laterality: N/A;   TEE WITHOUT CARDIOVERSION N/A 06/29/2022   Procedure: TRANSESOPHAGEAL ECHOCARDIOGRAM;  Surgeon: Delford Maude BROCKS, MD;  Location: [facility] INVASIVE CV LAB;  Service: Cardiovascular;  Laterality: N/A;   WISDOM TOOTH EXTRACTION     FAMILY HISTORY Family History  Problem Relation Age of Onset   Diabetes Mother    Aneurysm Mother 9   Seizures Mother    Diabetes Father    Cataracts Father    COPD Father    Heart attack Father    Heart disease Father    Healthy Sister    Healthy Daughter    Stroke Maternal Grandfather    Amblyopia Neg Hx    Blindness Neg Hx    Glaucoma Neg Hx    Macular degeneration Neg Hx    Retinal detachment Neg Hx    Strabismus Neg Hx    Retinitis pigmentosa Neg Hx    Colon cancer Neg Hx    Stomach cancer Neg Hx    Esophageal cancer Neg Hx    Pancreatic cancer Neg Hx    Liver disease Neg Hx    SOCIAL HISTORY Social History   Tobacco Use   Smoking status: Never    Passive exposure: Never   Smokeless tobacco: Never  Vaping Use   Vaping status: Never Used  Substance Use Topics    Alcohol use: Not Currently   Drug use: Never       OPHTHALMIC EXAM:  Base Eye Exam     Visual Acuity (Snellen - Linear)       Right Left   Dist cc 20/50 20/150   Dist ph cc 20/25 20/100         Tonometry (Tonopen, 7:55 AM)       Right Left   Pressure 21 21         Pupils       Dark Light Shape React APD   Right 3 2 Round Minimal None   Left 3 2 Irregular Minimal None         Visual Fields       Left Right     Full   Restrictions Total superior temporal, superior nasal, inferior nasal deficiencies          Extraocular Movement       Right Left    Full, Ortho Full, Ortho         Neuro/Psych     Oriented x3: Yes   Mood/Affect: Normal         Dilation     Both eyes: 1.0% Mydriacyl , 2.5% Phenylephrine  @ 7:52 AM           Slit Lamp and Fundus Exam     Slit Lamp Exam       Right Left   Lids/Lashes Normal Meibomian gland dysfunction   Conjunctiva/Sclera mild melanosis mild melanosis   Cornea trace tear film debris Well healed cataract wounds, tear film debris   Anterior Chamber Deep and clear Deep and clear   Iris Round and dilated, No NVI Round and dilated, No NVI   Lens Trace Posterior subcapsular cataract PC IOL in good position with  open PC   Anterior Vitreous Vitreous syneresis, diffuse RBC -- vastly improved, VH clearing centrally and settling inferiorly, Posterior vitreous detachment post vitrectomy, clear         Fundus Exam       Right Left   Disc mild Pallor, Sharp rim 2-3+ Pallor, thin inferior rim, cupping, severe vascular attenuation   C/D Ratio 0.3 0.8   Macula Flat, good foveal reflex, scattered MA/IRH/exudate - improved, +ERM flat, Blunted foveal reflex, ERM gone, rare Microaneurysms, mild nasal thickening, central atrophy   Vessels attenuated, Tortuous sclerotic arterioles, severe attenuation, Tortuous, sheathing along IT arcades   Periphery Attached, scattered IRH, 360 PRP laser Attached, peripapillary fibrosis  regressing, 360 PRP laser changes           Refraction     Wearing Rx       Sphere Cylinder Axis Add   Right -1.00 Sphere 091 +0.00   Left +0.00 +0.75  +0.00            IMAGING AND PROCEDURES  Imaging and Procedures for @TODAY @  OCT, Retina - OU - Both Eyes       Right Eye Quality was borderline. Central Foveal Thickness: 205. Progression has been stable. Findings include normal foveal contour, no IRF, no SRF, intraretinal hyper-reflective material, epiretinal membrane, macular pucker, preretinal fibrosis.   Left Eye Quality was good. Central Foveal Thickness: 209. Progression has been stable. Findings include no IRF, no SRF, abnormal foveal contour, epiretinal membrane, outer retinal atrophy (Persistent nasal retinal thickening; stable improvement in ERM; persistent central ORA/SRHM).   Notes *Images captured and stored on drive  Diagnosis / Impression:  PDR OU OD: no DME OS: persistent nasal retinal thickening; stable improvement in ERM; persistent central ORA/SRHM  Clinical management:  See below Abbreviations: NFP - Normal foveal profile. CME - cystoid macular edema. PED - pigment epithelial detachment. IRF - intraretinal fluid. SRF - subretinal fluid. EZ - ellipsoid zone. ERM - epiretinal membrane. ORA - outer retinal atrophy. ORT - outer retinal tubulation. SRHM - subretinal hyper-reflective material             ASSESSMENT/PLAN:    ICD-10-CM   1. 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 Encounter for long-term (current) use of insulin  (HCC)  Z79.4     3. Traction detachment of left retina  H33.42     4. Vitreous hemorrhage of both eyes (HCC)  H43.13     5. Epiretinal membrane (ERM) of left eye  H35.372 OCT, Retina - OU - Both Eyes    6. Essential hypertension  I10     7. Hypertensive retinopathy of both eyes  H35.033     8. Combined forms of age-related cataract of right eye   H25.811     9. Pseudophakia  Z96.1      1-3. 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 (OS > OD)  - A1c: 5.6 on 10.03.25, 7.0 on 02.21.25  **delayed f/u - 11 mos instead of 6-9 mos on 01.20.226**  **lost to f/u from 02.08.22 to 01.22.25 (almost 3 years)**  - s/p PRP OS (11.26.19)  - s/p PRP OD (06.01.20)  - lost to f/u following 11.26.19 appt due to complicated pregnancy, premature birth of baby w/ extended NICU stay and congenital heart defect, and post-partum health issues OS  - history of vision loss OS onset December 2019  - OS with chronic VH and TRD,  likely present since Dec 2019  - s/p IVA OS #1 (06.23.20), #2 (04.02.21)  - s/p PPV/MP/EL/silicon oil + IVA OS for VH and TRD from DM1, 07.02.20             - had cataract surgery OS with Dr. Marcey, 12.15.20 -- looks good, +PCO  - s/p PPV/SOR removal/Tissue Blue stain/MP/20% SF6 gas, IVA OS, 07.01.21   OD  - today, VH stably improved  - s/p fill in PRP and IVA OD #1 07.02.2020 in operating room  - diffuse VH OD ~11.26.20   - s/p IVA OD #2 (11.30.20), #3 (09.24.21)  - BCVA OD 20/25  - f/u 9 months, DFE, OCT  4,5. Epiretinal membrane, left eye --  - focal ERM with retinal thickening nasal macula   - s/p TissueBlue  assisted membrane peel OS as above (7.1.21)  - OCT today shows improved nasal thickening post-ERM peel   6,7. Hypertensive retinopathy OU - there was concern for pregnancy related hypertension (I.e. Pre-eclampsia, Eclampsia, HELLP, etc) during pregnancy - also during pregnancy (7.17.2019), had bilateral CRVO w/ CME (OD > OS) s/p PRP OS  - discussed importance of tight BP control  - monitor  8. Mixed cataract OD - The symptoms of cataract, surgical options, and treatments and risks were discussed with patient.  - not visually significant    9. Pseudophakia OS  - s/p CE/IOL OS w/ Dr. Marcey, 12.15.20  - beautiful surgery  - s/p YAG cap OS (01.22.25) -- good PC opening  - BCVA remains 20/100 -- limited  by retinal atrophy  Ophthalmic Meds Ordered this visit:  No orders of the defined types were placed in this encounter.     This document serves as a record of services personally performed by Redell JUDITHANN Hans, MD, PhD. It was created on their behalf by Wanda GEANNIE Keens, COT an ophthalmic technician. The creation of this record is the provider's dictation and/or activities during the visit.    Electronically signed by:  Wanda GEANNIE Keens, COT  02/15/24 12:40 PM  This document serves as a record of services personally performed by Redell JUDITHANN Hans, MD, PhD. It was created on their behalf by Almetta Pesa, an ophthalmic technician. The creation of this record is the provider's dictation and/or activities during the visit.    Electronically signed by: Almetta Pesa, OA, 02/15/24  12:40 PM  Redell JUDITHANN Hans, M.D., Ph.D. Diseases & Surgery of the Retina and Vitreous [facility]  I have reviewed the above documentation for accuracy and completeness, and I agree with the above. Redell JUDITHANN Hans, M.D., Ph.D. 02/15/24 12:42 PM   Abbreviations: M myopia (nearsighted); A astigmatism; H hyperopia (farsighted); P presbyopia; Mrx spectacle prescription;  CTL contact lenses; OD right eye; OS left eye; OU both eyes  XT exotropia; ET esotropia; PEK punctate epithelial keratitis; PEE punctate epithelial erosions; DES dry eye syndrome; MGD meibomian gland dysfunction; ATs artificial tears; PFAT's preservative free artificial tears; NSC nuclear sclerotic cataract; PSC posterior subcapsular cataract; ERM epi-retinal membrane; PVD posterior vitreous detachment; RD retinal detachment; DM diabetes mellitus; DR diabetic retinopathy; NPDR non-proliferative diabetic retinopathy; PDR proliferative diabetic retinopathy; CSME clinically significant macular edema; DME diabetic macular edema; dbh dot blot hemorrhages; CWS cotton wool spot; POAG primary open angle glaucoma; C/D cup-to-disc ratio;  HVF humphrey visual field; GVF goldmann visual field; OCT optical coherence tomography; IOP intraocular pressure; BRVO Branch retinal vein occlusion; CRVO central retinal vein occlusion; CRAO central retinal artery occlusion; BRAO branch retinal  artery occlusion; RT retinal tear; SB scleral buckle; PPV pars plana vitrectomy; VH Vitreous hemorrhage; PRP panretinal laser photocoagulation; IVK intravitreal kenalog ; VMT vitreomacular traction; MH Macular hole;  NVD neovascularization of the disc; NVE neovascularization elsewhere; AREDS age related eye disease study; ARMD age related macular degeneration; POAG primary open angle glaucoma; EBMD epithelial/anterior basement membrane dystrophy; ACIOL anterior chamber intraocular lens; IOL intraocular lens; PCIOL posterior chamber intraocular lens; Phaco/IOL phacoemulsification with intraocular lens placement; PRK photorefractive keratectomy; LASIK laser assisted in situ keratomileusis; HTN hypertension; DM diabetes mellitus; COPD chronic obstructive pulmonary disease "

## 2024-02-07 ENCOUNTER — Ambulatory Visit (INDEPENDENT_AMBULATORY_CARE_PROVIDER_SITE_OTHER): Admitting: Ophthalmology

## 2024-02-07 DIAGNOSIS — H3342 Traction detachment of retina, left eye: Secondary | ICD-10-CM

## 2024-02-07 DIAGNOSIS — H35033 Hypertensive retinopathy, bilateral: Secondary | ICD-10-CM | POA: Diagnosis not present

## 2024-02-07 DIAGNOSIS — I10 Essential (primary) hypertension: Secondary | ICD-10-CM | POA: Diagnosis not present

## 2024-02-07 DIAGNOSIS — E103513 Type 1 diabetes mellitus with proliferative diabetic retinopathy with macular edema, bilateral: Secondary | ICD-10-CM | POA: Diagnosis not present

## 2024-02-07 DIAGNOSIS — H35372 Puckering of macula, left eye: Secondary | ICD-10-CM

## 2024-02-07 DIAGNOSIS — Z961 Presence of intraocular lens: Secondary | ICD-10-CM | POA: Diagnosis not present

## 2024-02-07 DIAGNOSIS — Z794 Long term (current) use of insulin: Secondary | ICD-10-CM

## 2024-02-07 DIAGNOSIS — H25811 Combined forms of age-related cataract, right eye: Secondary | ICD-10-CM | POA: Diagnosis not present

## 2024-02-07 DIAGNOSIS — H4313 Vitreous hemorrhage, bilateral: Secondary | ICD-10-CM

## 2024-02-15 ENCOUNTER — Encounter (INDEPENDENT_AMBULATORY_CARE_PROVIDER_SITE_OTHER): Payer: Self-pay | Admitting: Ophthalmology

## 2024-07-18 ENCOUNTER — Ambulatory Visit: Admitting: Internal Medicine

## 2024-11-06 ENCOUNTER — Encounter (INDEPENDENT_AMBULATORY_CARE_PROVIDER_SITE_OTHER): Admitting: Ophthalmology
